# Patient Record
Sex: Male | Born: 1940 | Race: White | Hispanic: No | Marital: Married | Smoking: Former smoker
Health system: Southern US, Community
[De-identification: ages and names within clinical notes are randomized; demographics above are authoritative.]

## PROBLEM LIST (undated history)

## (undated) DIAGNOSIS — I2510 Atherosclerotic heart disease of native coronary artery without angina pectoris: Secondary | ICD-10-CM

## (undated) DIAGNOSIS — I712 Thoracic aortic aneurysm, without rupture: Secondary | ICD-10-CM

## (undated) DIAGNOSIS — G4733 Obstructive sleep apnea (adult) (pediatric): Secondary | ICD-10-CM

## (undated) DIAGNOSIS — E119 Type 2 diabetes mellitus without complications: Secondary | ICD-10-CM

## (undated) DIAGNOSIS — I6529 Occlusion and stenosis of unspecified carotid artery: Secondary | ICD-10-CM

## (undated) DIAGNOSIS — IMO0002 Reserved for concepts with insufficient information to code with codable children: Secondary | ICD-10-CM

## (undated) DIAGNOSIS — I10 Essential (primary) hypertension: Secondary | ICD-10-CM

## (undated) DIAGNOSIS — G2581 Restless legs syndrome: Secondary | ICD-10-CM

## (undated) DIAGNOSIS — K219 Gastro-esophageal reflux disease without esophagitis: Secondary | ICD-10-CM

## (undated) DIAGNOSIS — R51 Headache: Secondary | ICD-10-CM

## (undated) DIAGNOSIS — M1990 Unspecified osteoarthritis, unspecified site: Secondary | ICD-10-CM

## (undated) DIAGNOSIS — K2270 Barrett's esophagus without dysplasia: Secondary | ICD-10-CM

## (undated) DIAGNOSIS — J189 Pneumonia, unspecified organism: Secondary | ICD-10-CM

## (undated) DIAGNOSIS — K5900 Constipation, unspecified: Secondary | ICD-10-CM

## (undated) DIAGNOSIS — Z85850 Personal history of malignant neoplasm of thyroid: Secondary | ICD-10-CM

## (undated) DIAGNOSIS — E079 Disorder of thyroid, unspecified: Secondary | ICD-10-CM

## (undated) DIAGNOSIS — J40 Bronchitis, not specified as acute or chronic: Secondary | ICD-10-CM

## (undated) DIAGNOSIS — Z9889 Other specified postprocedural states: Secondary | ICD-10-CM

## (undated) DIAGNOSIS — R519 Headache, unspecified: Secondary | ICD-10-CM

## (undated) DIAGNOSIS — R0600 Dyspnea, unspecified: Secondary | ICD-10-CM

## (undated) DIAGNOSIS — C801 Malignant (primary) neoplasm, unspecified: Secondary | ICD-10-CM

## (undated) DIAGNOSIS — G4730 Sleep apnea, unspecified: Secondary | ICD-10-CM

## (undated) DIAGNOSIS — J449 Chronic obstructive pulmonary disease, unspecified: Secondary | ICD-10-CM

## (undated) DIAGNOSIS — I7121 Aneurysm of the ascending aorta, without rupture: Secondary | ICD-10-CM

## (undated) HISTORY — PX: ROTATOR CUFF REPAIR: SHX139

## (undated) HISTORY — DX: Type 2 diabetes mellitus without complications: E11.9

## (undated) HISTORY — PX: JOINT REPLACEMENT: SHX530

## (undated) HISTORY — DX: Restless legs syndrome: G25.81

## (undated) HISTORY — DX: Essential (primary) hypertension: I10

## (undated) HISTORY — DX: Gastro-esophageal reflux disease without esophagitis: K21.9

## (undated) HISTORY — DX: Barrett's esophagus without dysplasia: K22.70

## (undated) HISTORY — PX: COLONOSCOPY: SHX174

## (undated) HISTORY — DX: Personal history of malignant neoplasm of thyroid: Z85.850

## (undated) HISTORY — DX: Occlusion and stenosis of unspecified carotid artery: I65.29

## (undated) HISTORY — DX: Disorder of thyroid, unspecified: E07.9

## (undated) HISTORY — PX: EYE SURGERY: SHX253

## (undated) HISTORY — DX: Malignant (primary) neoplasm, unspecified: C80.1

## (undated) HISTORY — PX: OTHER SURGICAL HISTORY: SHX169

## (undated) HISTORY — DX: Atherosclerotic heart disease of native coronary artery without angina pectoris: I25.10

## (undated) HISTORY — DX: Sleep apnea, unspecified: G47.30

## (undated) HISTORY — DX: Obstructive sleep apnea (adult) (pediatric): G47.33

## (undated) HISTORY — DX: Chronic obstructive pulmonary disease, unspecified: J44.9

## (undated) HISTORY — DX: Reserved for concepts with insufficient information to code with codable children: IMO0002

---

## 1997-11-20 ENCOUNTER — Ambulatory Visit (HOSPITAL_COMMUNITY): Admission: RE | Admit: 1997-11-20 | Discharge: 1997-11-20 | Payer: Self-pay | Admitting: Gastroenterology

## 1999-08-31 ENCOUNTER — Encounter: Admission: RE | Admit: 1999-08-31 | Discharge: 1999-08-31 | Payer: Self-pay | Admitting: *Deleted

## 1999-08-31 ENCOUNTER — Encounter: Payer: Self-pay | Admitting: *Deleted

## 2002-01-06 ENCOUNTER — Ambulatory Visit (HOSPITAL_COMMUNITY): Admission: RE | Admit: 2002-01-06 | Discharge: 2002-01-06 | Payer: Self-pay | Admitting: Internal Medicine

## 2003-04-07 ENCOUNTER — Ambulatory Visit (HOSPITAL_BASED_OUTPATIENT_CLINIC_OR_DEPARTMENT_OTHER): Admission: RE | Admit: 2003-04-07 | Discharge: 2003-04-07 | Payer: Self-pay | Admitting: Internal Medicine

## 2003-11-19 ENCOUNTER — Ambulatory Visit (HOSPITAL_COMMUNITY): Admission: RE | Admit: 2003-11-19 | Discharge: 2003-11-19 | Payer: Self-pay | Admitting: Internal Medicine

## 2005-10-16 ENCOUNTER — Encounter: Admission: RE | Admit: 2005-10-16 | Discharge: 2005-10-16 | Payer: Self-pay | Admitting: Internal Medicine

## 2005-10-16 IMAGING — US US CAROTID DUPLEX BILAT
1 series · 13 of 24 positions shown · non-contrast
Comparison: None.

CLINICAL DATA: 64 year old male with hypertension, tobacco use, and a left carotid bruit.  
DUPLEX CAROTID ULTRASOUND:

[Series 1: unknown · 0.09mm/px · 13 of 71 slices shown]
[im 1/71]
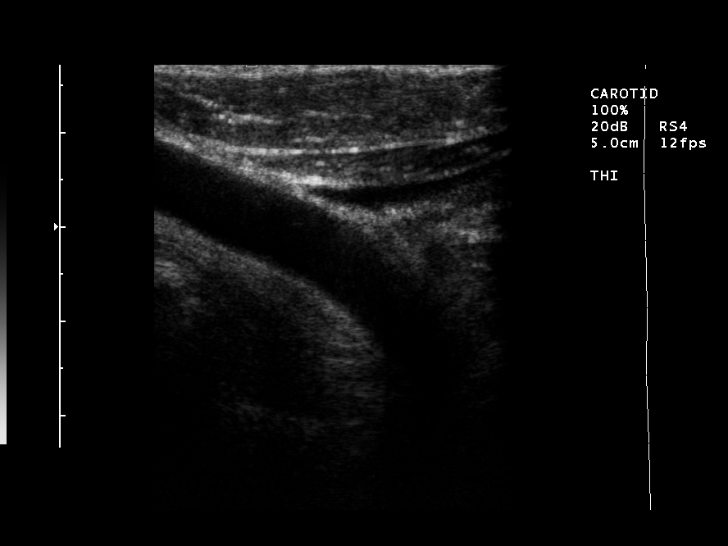
[im 7/71]
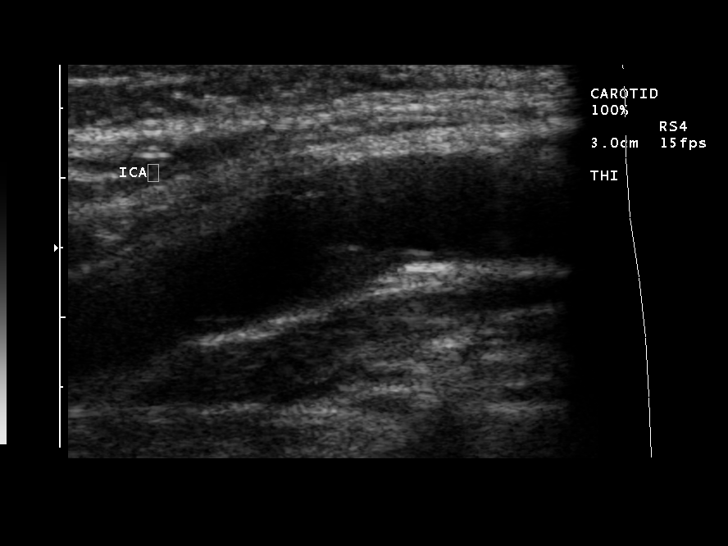
[im 13/71]
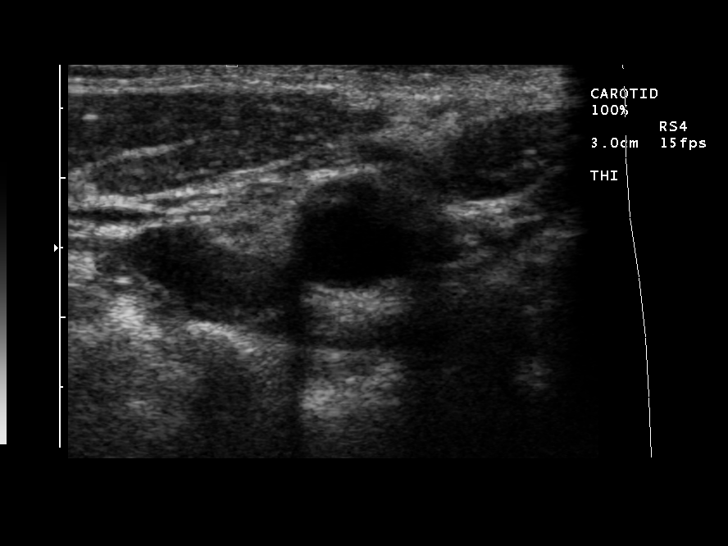
[im 19/71]
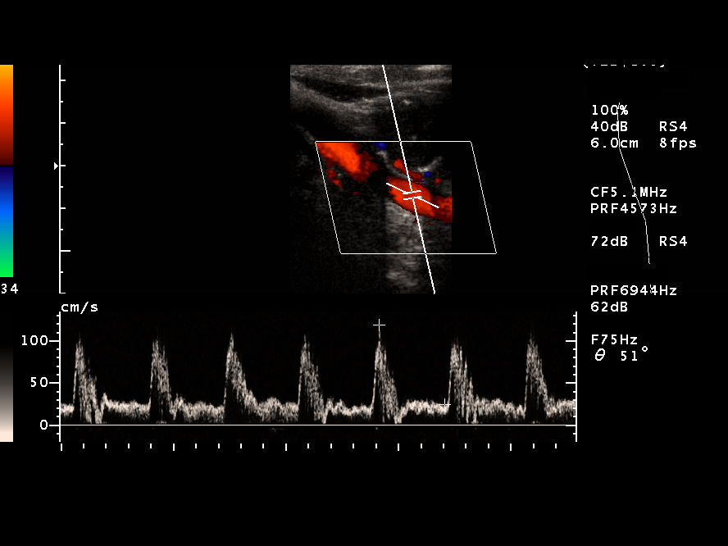
[im 25/71]
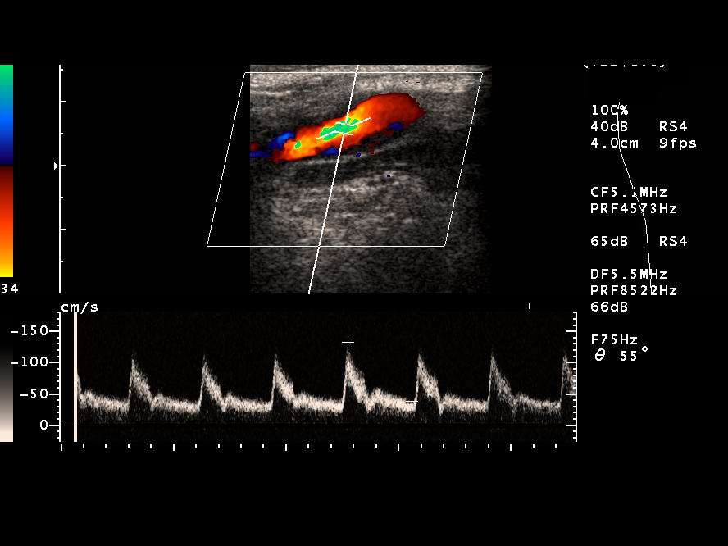
[im 31/71]
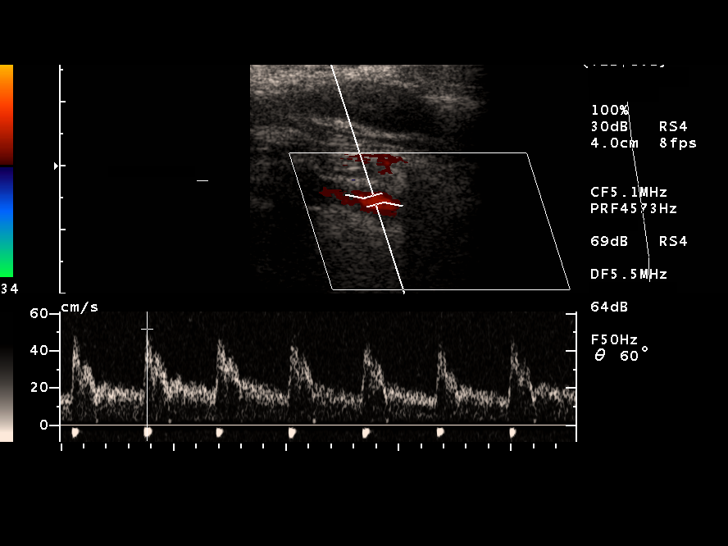
[im 37/71]
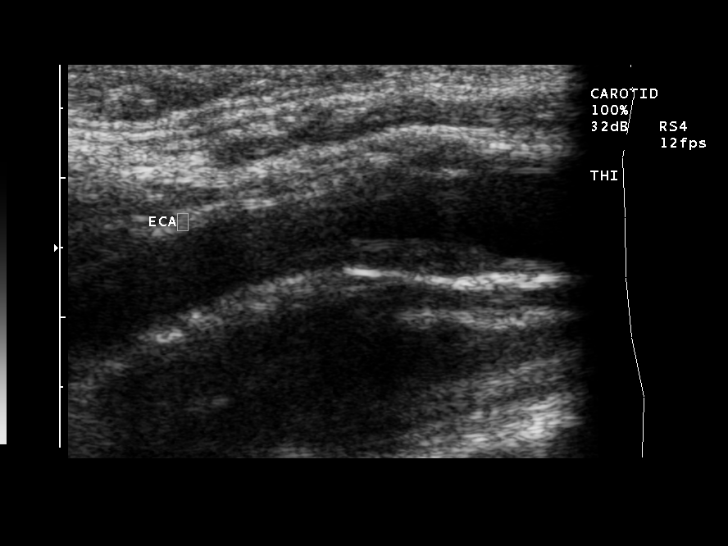
[im 40/71]
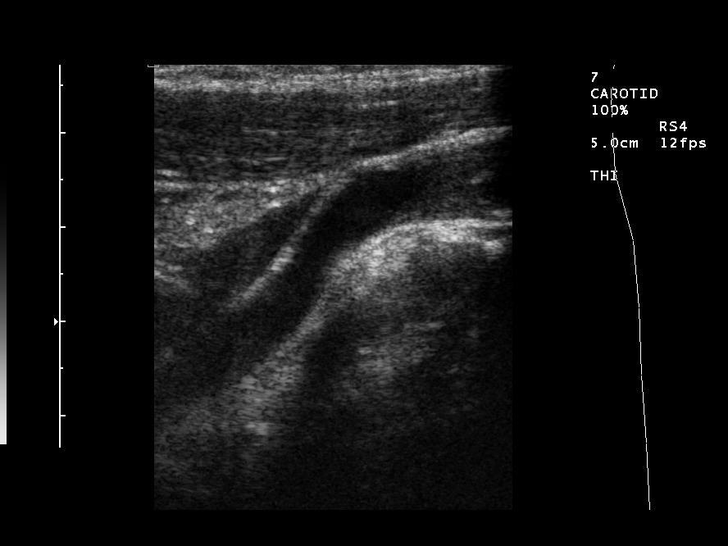
[im 46/71]
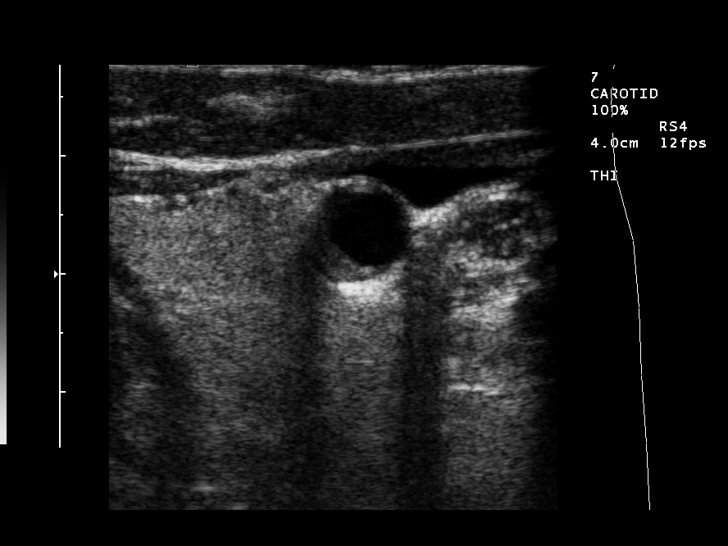
[im 52/71]
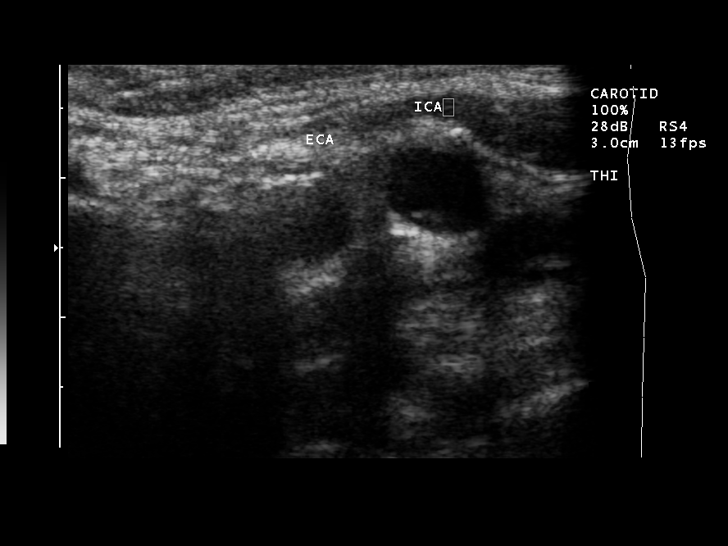
[im 58/71]
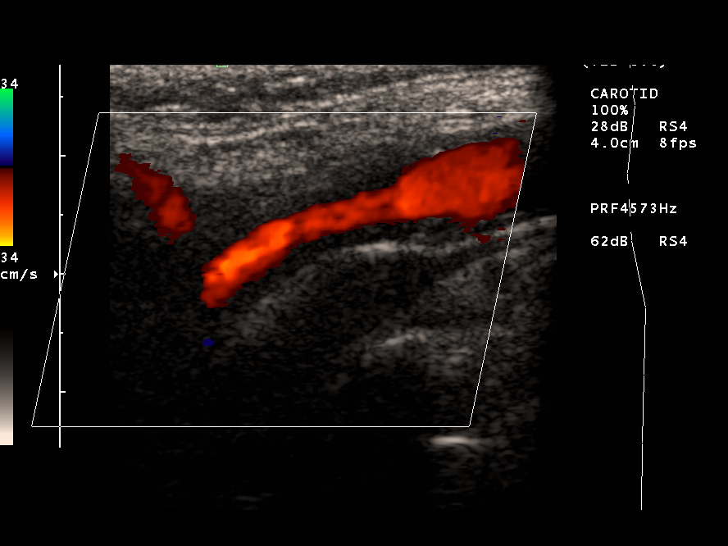
[im 64/71]
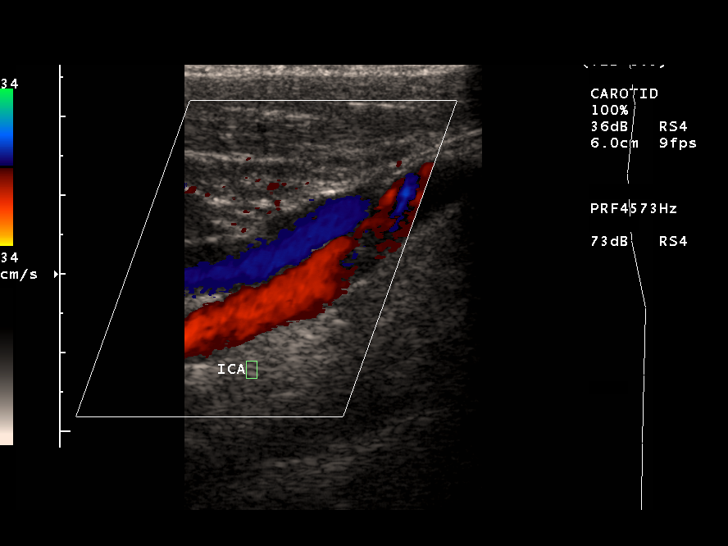
[im 71/71]
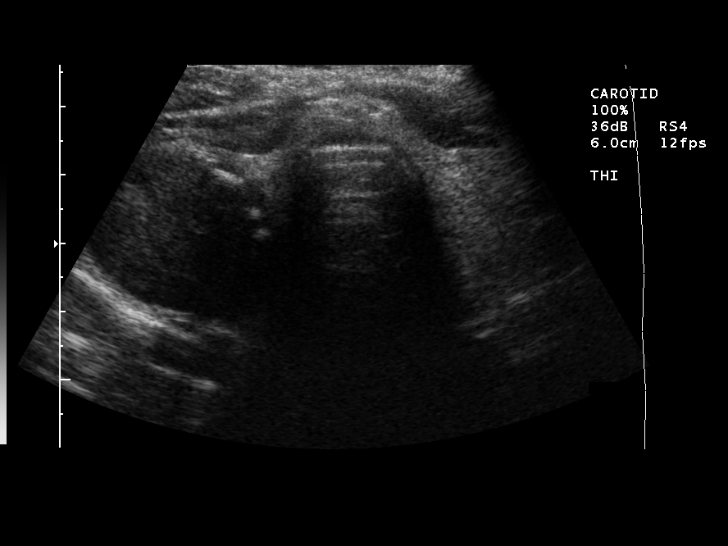

[13 of 24 positions shown; findings below may reference images not displayed]

Velocities are as follows (cm per second):
SITE  PEAK SYSTOLIC  END-DIASTOLIC
RIGHT ICA  155  52
RIGHT CCA    118  24
RIGHT ICA/CCA RATIO
RIGHT ECA  170
LEFT ICA  390  161
LEFT CCA  96  22
LEFT ICA/CCA RATIO
LEFT ECA  170
Right Carotid System:  There is plaque formation and intimal thickening of the carotid bifurcation.  The right ICA velocity is elevated at 155cm per second.  This is compared to a right CCA velocity of 118cm per second.  The right ICA/CCA ratio is 1.31.  The end diastolic velocity is elevated at 52cm per second.  This correlates with a 50 to 69% right ICA stenosis by velocity and ratio criteria.  The right vertebral artery is antegrade in flow.  
Left Carotid System:  There is more extensive atherosclerotic plaque formation of the left carotid bifurcation.  There is marked elevation of a left ICA peak systolic velocity at 390cm per second compared to a CCA velocity of 96cm per second.  There is spectral broadening diffusely of the ICA wave form .  The left ICA/CCA ratio is abnormal at 4.09.  End diastolic velocity is also elevated at 161cm per second.  This correlates with a significant greater than 70% left ICA stenosis.  The left vertebral artery is antegrade in flow.
In the right thyroid gland there is a solid round thyroid mass measuring 2.5cm.  Dedicated thyroid ultrasound could be performed.
IMPRESSION: 1.  Hemodynamically significant greater than 70% left ICA stenosis. 
2.  Right ICA stenosis estimated at 50 to 69%.  
3.  Both vertebral arteries are antegrade in flow and patent. 
4.  2.5cm solid right thyroid mass.

## 2005-10-23 ENCOUNTER — Encounter: Admission: RE | Admit: 2005-10-23 | Discharge: 2005-10-23 | Payer: Self-pay | Admitting: Internal Medicine

## 2005-10-26 ENCOUNTER — Other Ambulatory Visit: Admission: RE | Admit: 2005-10-26 | Discharge: 2005-10-26 | Payer: Self-pay | Admitting: Interventional Radiology

## 2005-10-26 ENCOUNTER — Encounter: Admission: RE | Admit: 2005-10-26 | Discharge: 2005-10-26 | Payer: Self-pay | Admitting: Internal Medicine

## 2005-10-26 ENCOUNTER — Encounter (INDEPENDENT_AMBULATORY_CARE_PROVIDER_SITE_OTHER): Payer: Self-pay | Admitting: *Deleted

## 2005-10-26 IMAGING — US US BIOPSY
1 series · 6 of 6 positions shown · non-contrast
Comparison: none

CLINICAL DATA: Dominant cold right thyroid nodule

ULTRASOUND GUIDED ASPIRATION BIOPSY THYROID:
TECHNIQUE: Overlying skin prepped with Betadine, draped in usual sterile
fashion, and infiltrated locally with 1% lidocaine. Under real-time ultrasound
guidance, 4 passes were made into the dominant solid right mid pole nodule using
25 gauge needles. Samples were given to cytopathology. No immediate
complication.

[Series 1: unknown · 0.09mm/px · 6 of 6 slices shown]
[im 1/6]
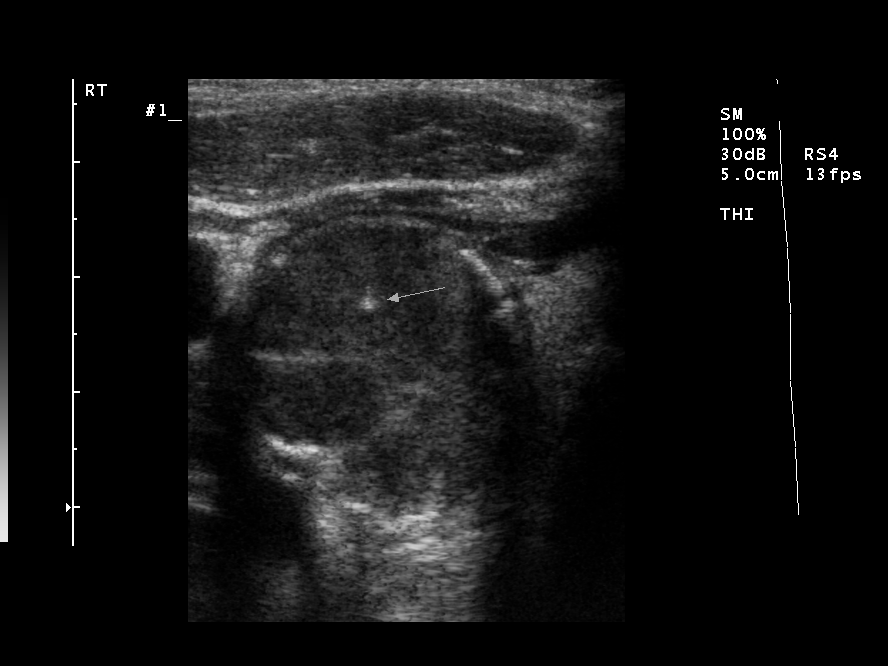
[im 2/6]
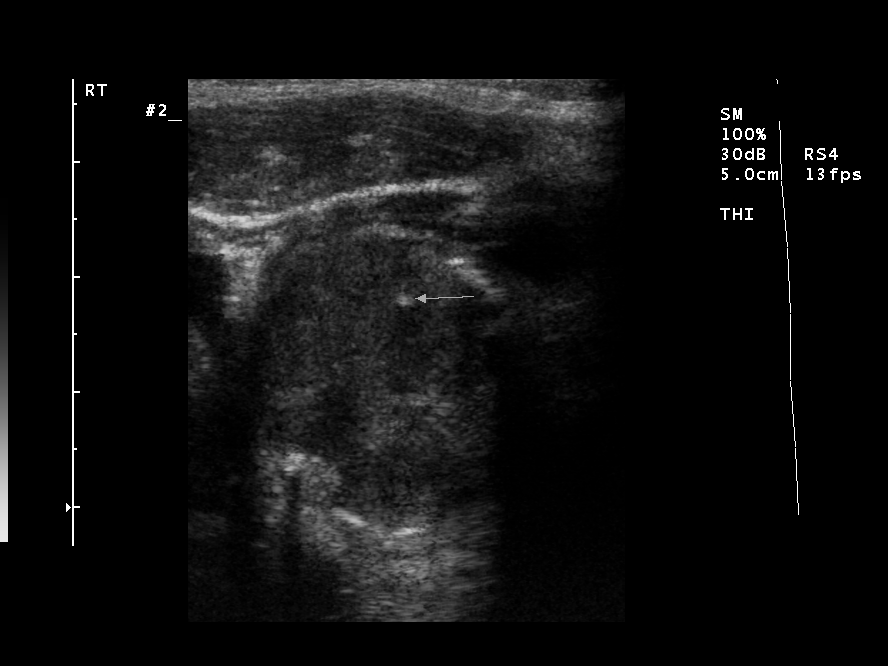
[im 3/6]
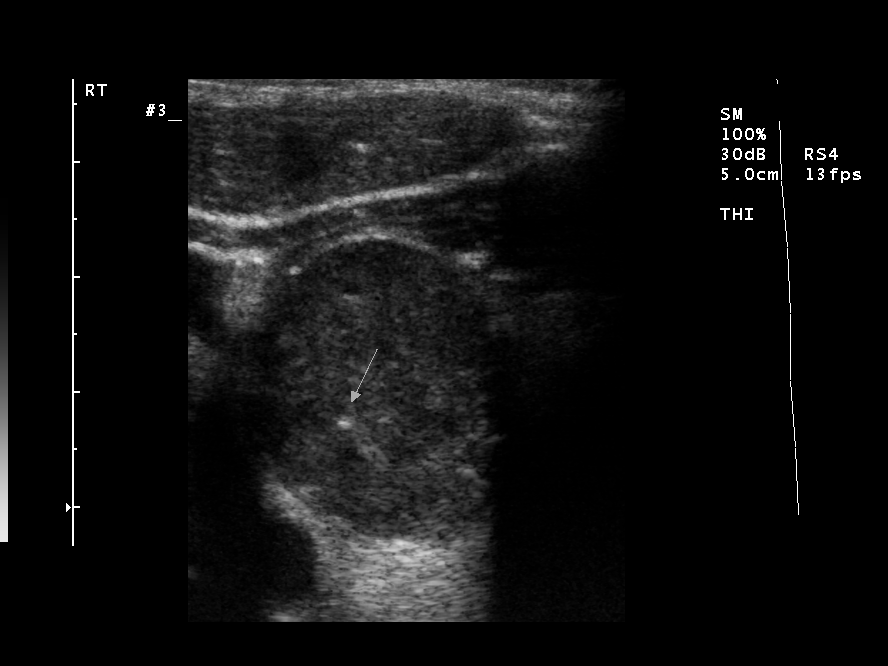
[im 4/6]
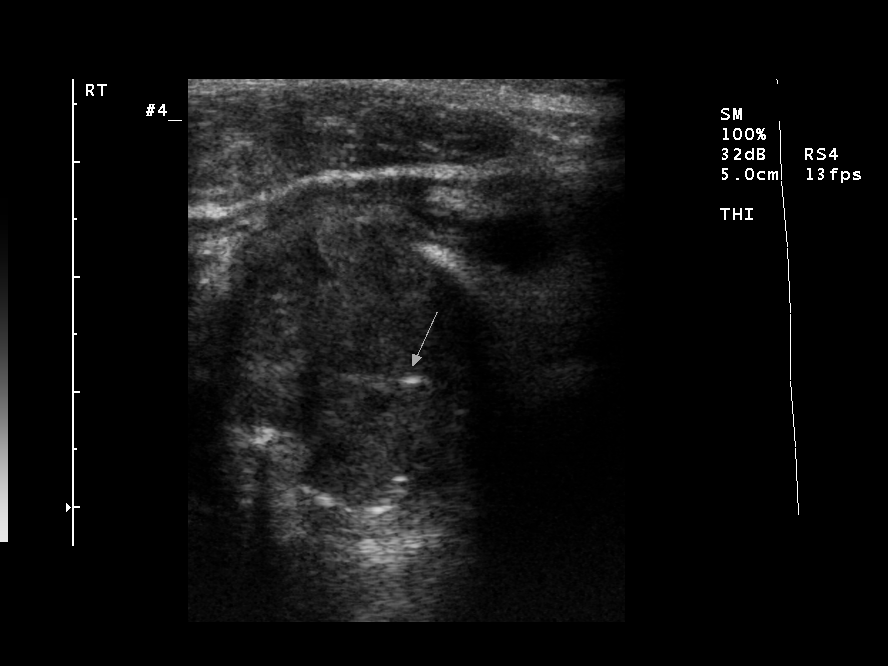
[im 5/6]
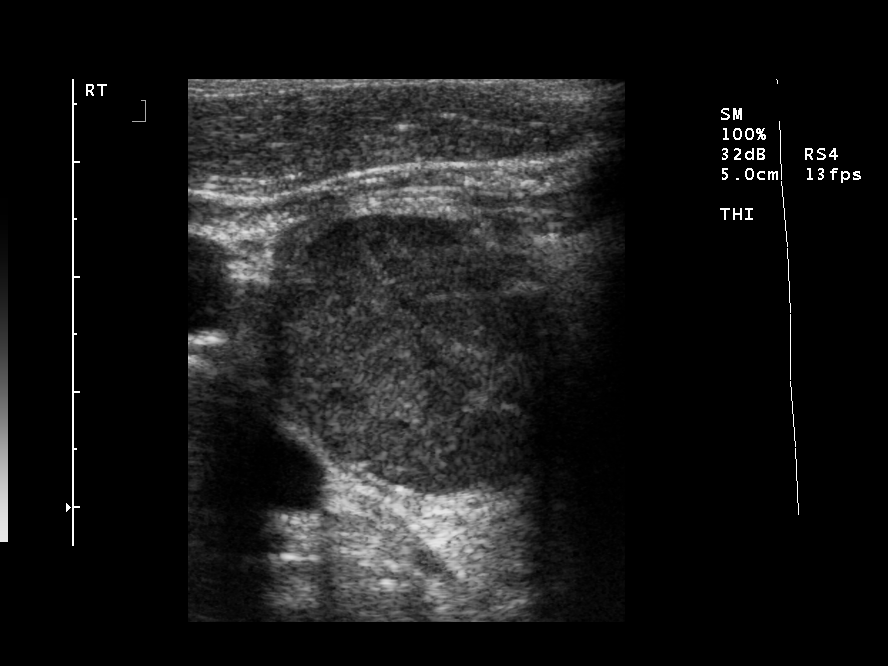
[im 6/6]
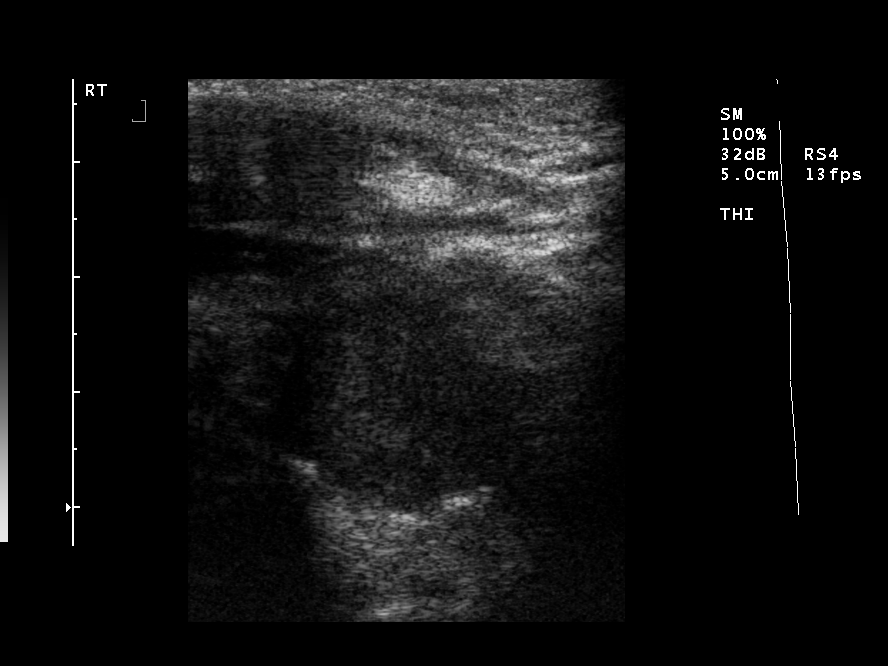

[6 of 6 positions shown; findings below may reference images not displayed]

IMPRESSION: Technically successful ultrasound guided FNA thyroid biopsy.

## 2005-10-26 IMAGING — US US BIOPSY
1 series · 14 of 25 positions shown · non-contrast
Comparison: none

CLINICAL DATA: Dominant cold right thyroid nodule

ULTRASOUND GUIDED ASPIRATION BIOPSY THYROID:
TECHNIQUE: Overlying skin prepped with Betadine, draped in usual sterile
fashion, and infiltrated locally with 1% lidocaine. Under real-time ultrasound
guidance, 4 passes were made into the dominant solid right mid pole nodule using
25 gauge needles. Samples were given to cytopathology. No immediate
complication.

[Series 1: unknown · 0.07mm/px · 14 of 43 slices shown]
[im 1/43]
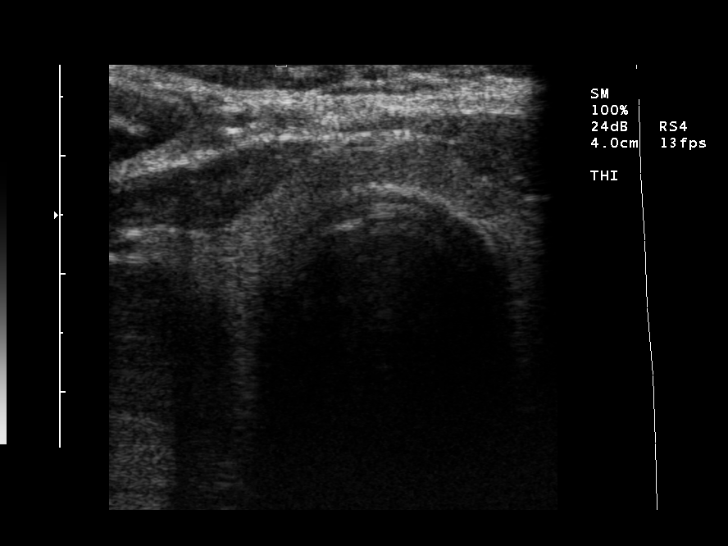
[im 4/43]
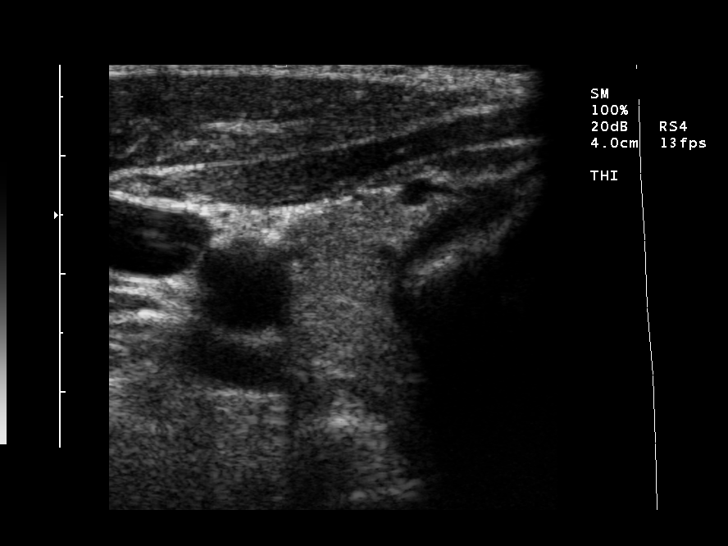
[im 8/43]
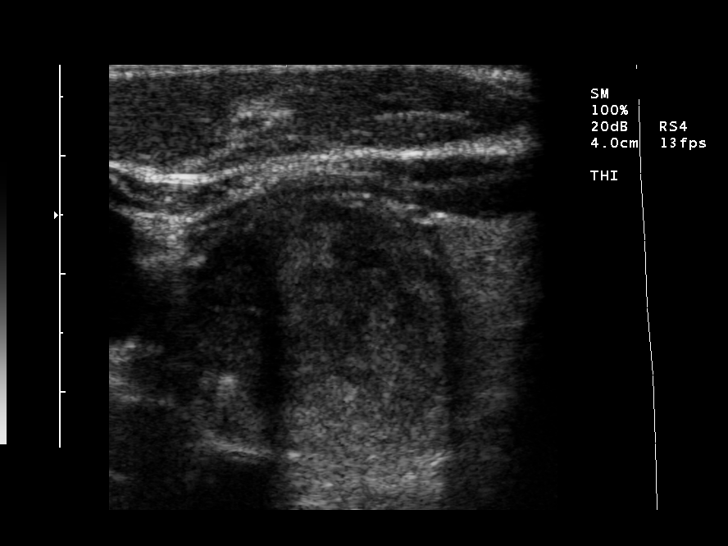
[im 11/43]
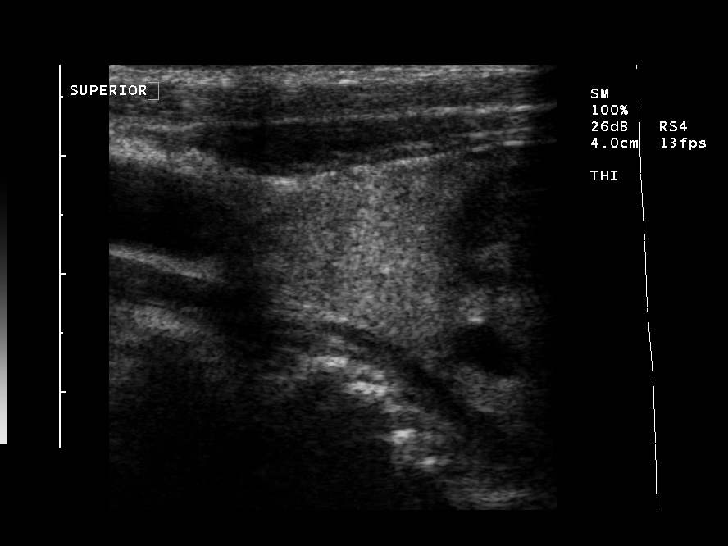
[im 15/43]
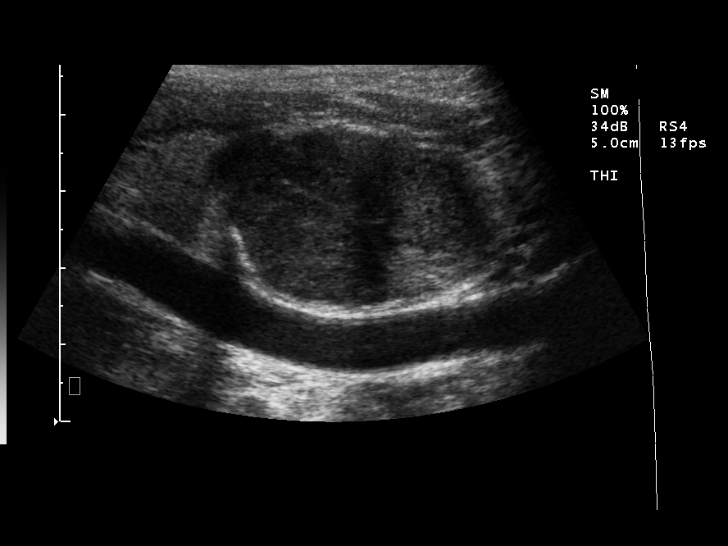
[im 16/43]
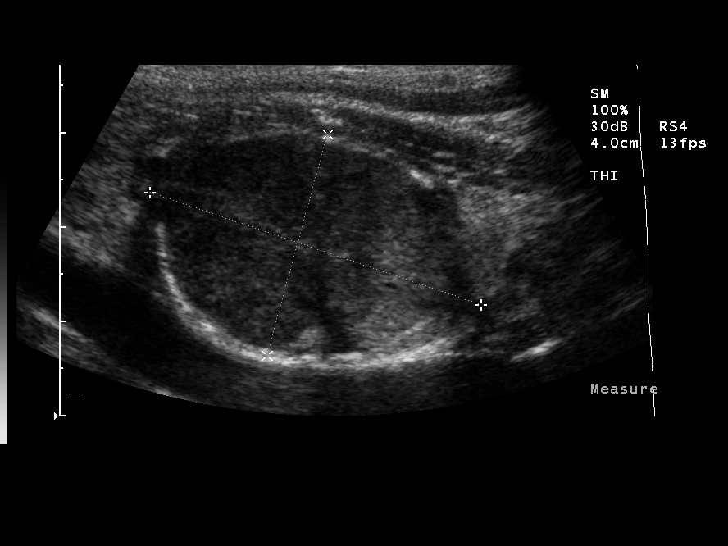
[im 20/43]
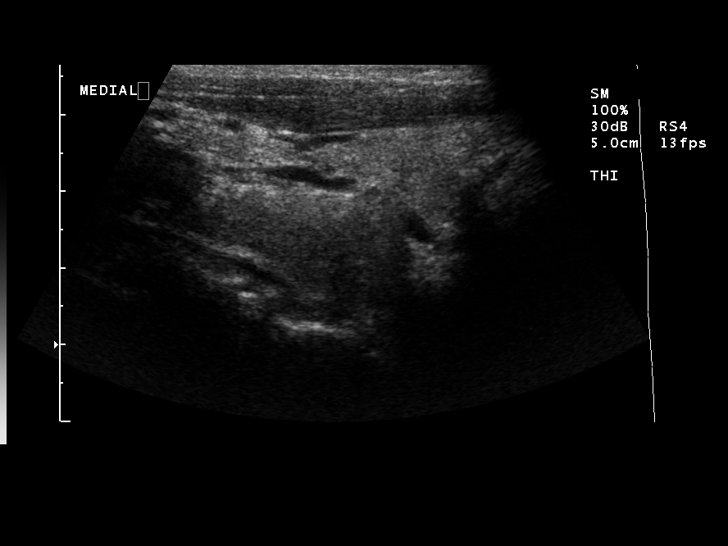
[im 23/43]
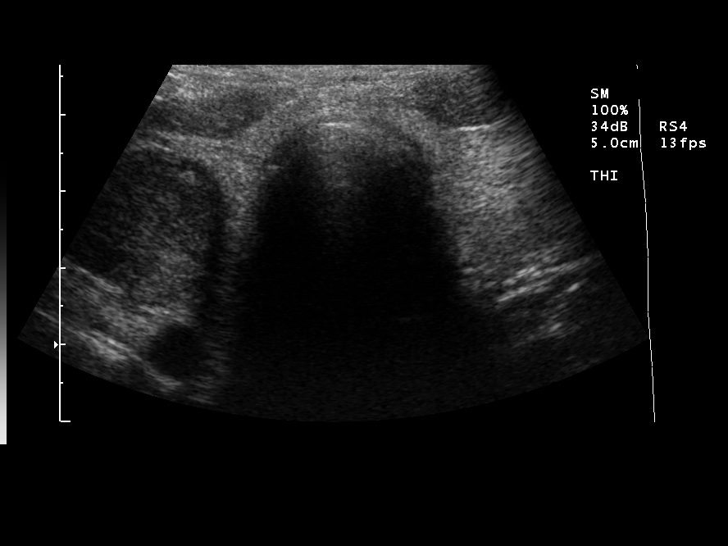
[im 27/43]
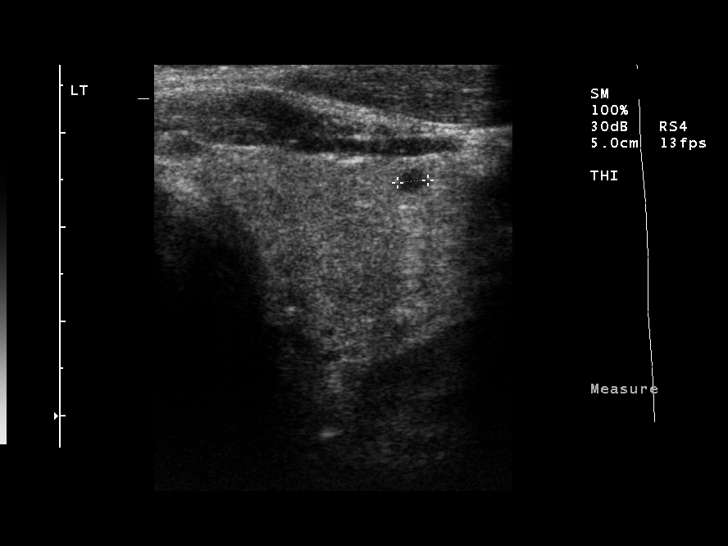
[im 29/43]
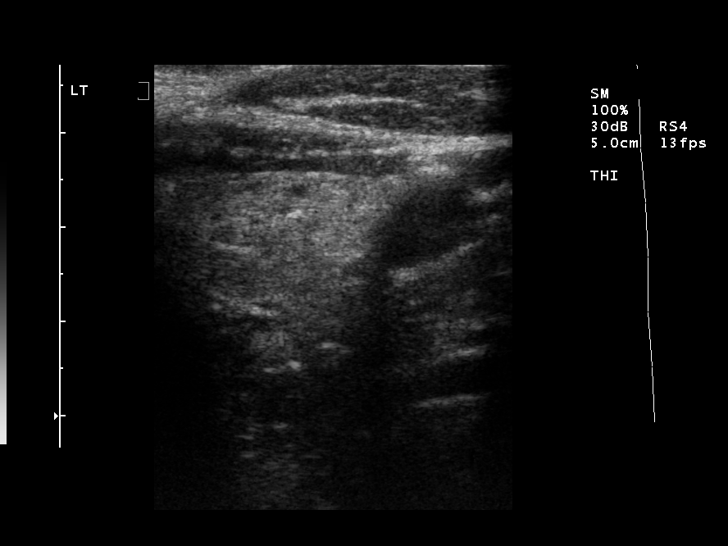
[im 32/43]
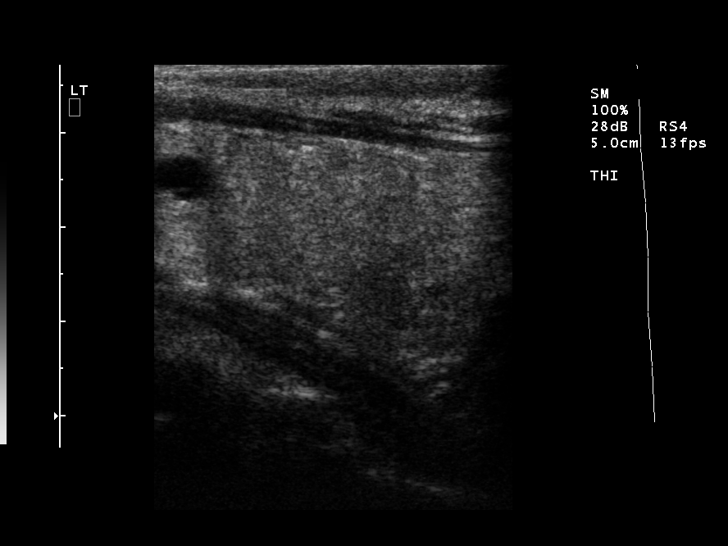
[im 36/43]
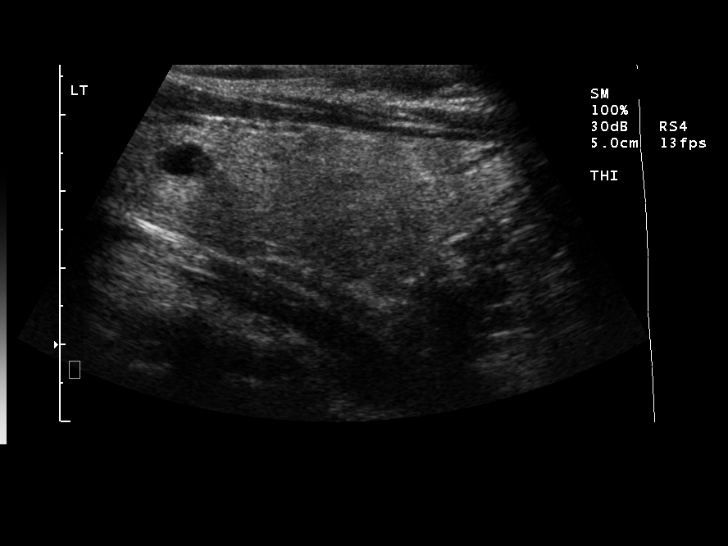
[im 39/43]
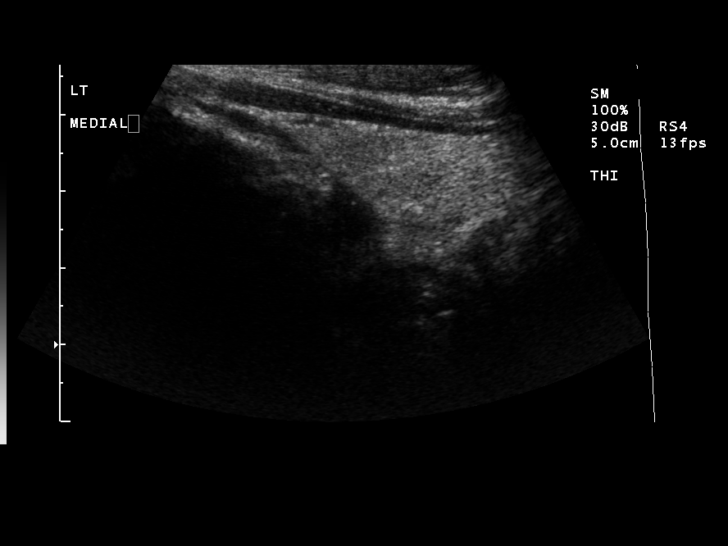
[im 43/43]
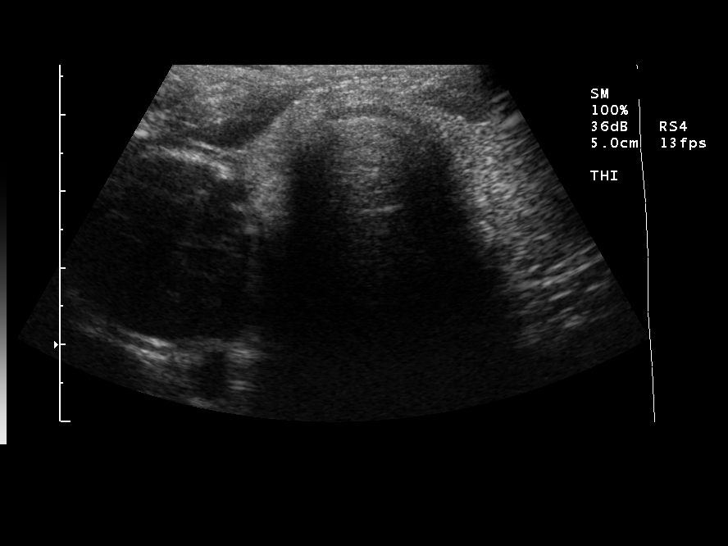

[14 of 25 positions shown; findings below may reference images not displayed]

IMPRESSION: Technically successful ultrasound guided FNA thyroid biopsy.

## 2005-12-18 ENCOUNTER — Encounter: Admission: RE | Admit: 2005-12-18 | Discharge: 2005-12-18 | Payer: Self-pay | Admitting: *Deleted

## 2005-12-18 IMAGING — CT CT ANGIO NECK
2 of 9 series · 7 of 33 positions shown · IV contrast ([ID] OMNI 350)
Comparison: Ultrasound, [DATE] and [DATE].   Nuclear medicine study of the thyroid gland, [DATE].

CLINICAL DATA: Left ICA stenosis.  Thyroid mass seen on ultrasound. 
CT ANGIOGRAPHY OF THE NECK:
TECHNIQUE: Multidetector CT imaging of the neck was performed during bolus injection of intravenous contrast.  Multiplanar CT angiographic image reconstructions were generated to evaluate the extracranial carotid arteries.
Contrast:  125 cc Omnipaque 350

[Series 5: recon 2: cta neck · axial · 0.29mm/px · z∈[+65,+246]mm · 6 of 405 slices shown]
[im 58/405  soft-tissue]
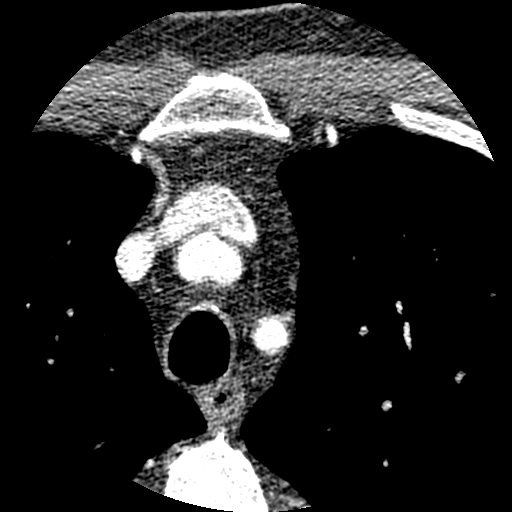
[im 116/405  bone]
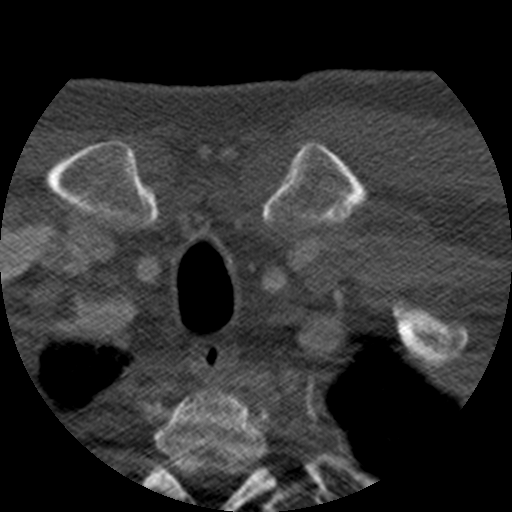
[im 174/405  soft-tissue]
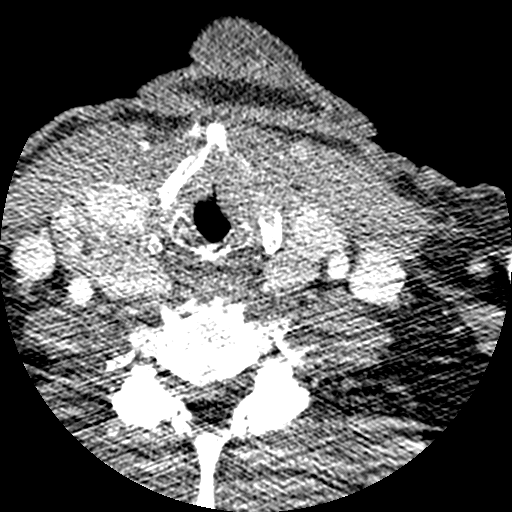
[im 231/405  bone]
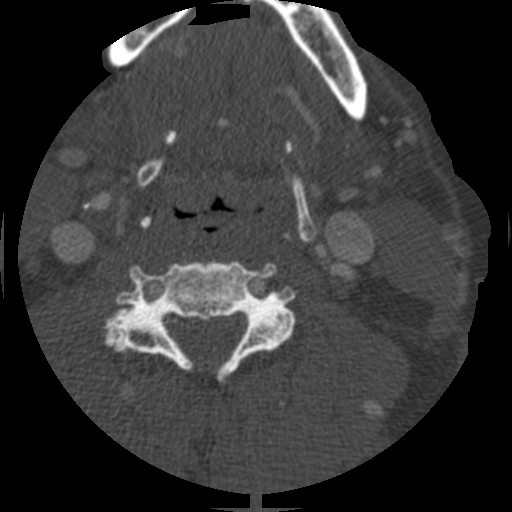
[im 289/405  soft-tissue]
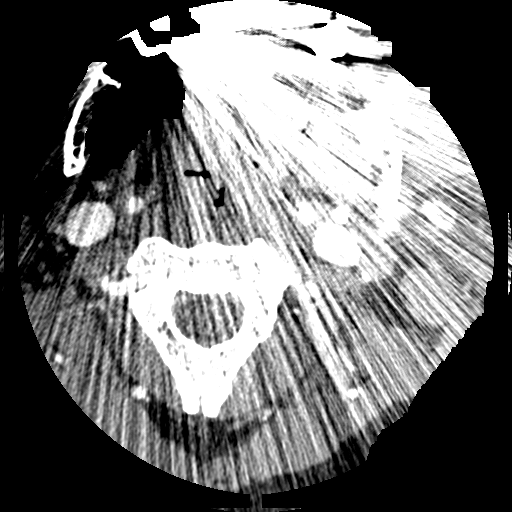
[im 347/405  bone]
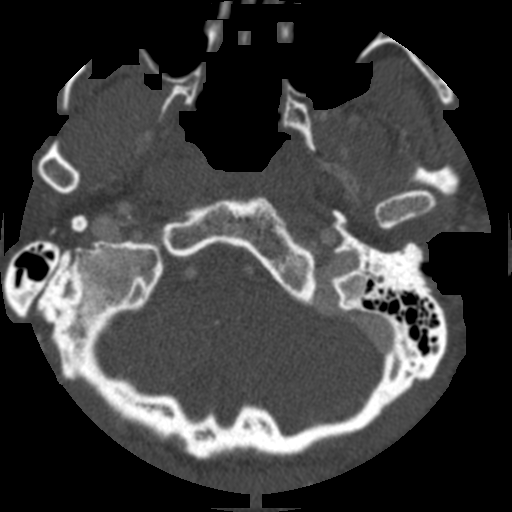

[Series 502: reformatted · sagittal · 0.49mm/px · 1 of 149 slices shown]
[im 75/149  soft-tissue]
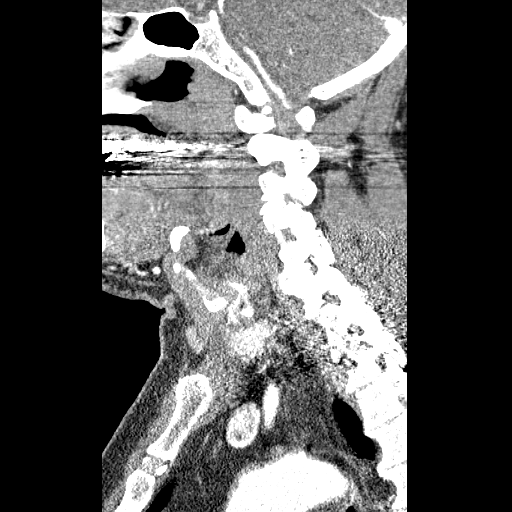

[7 of 33 positions shown; findings below may reference images not displayed]

FINDINGS: The branching pattern of the brachiocephalic vessels from the arch is normal.   No origin stenoses.  
The right common carotid artery is widely patent to the bifurcation.  There is atherosclerotic plaque at the bifurcation.  The narrowest diameter of the proximal ICA is 3.5 mm.   The normal distal ICA has a diameter of 5.1 mm.  The stenosis would be graded at 30% using NASCET criteria.  There is very mild narrowing of the proximal ECA.  
On the left, the common carotid artery is widely patent to the bifurcation.  There is atherosclerotic plaque affecting the carotid bifurcation region.  This has irregular plaque that includes a proximal posterior wall ulceration.  The narrowest diameter of the proximal ICA is 2 mm.  There is some post stenotic dilatation.  Distally, the normal ICA measures 5 mm.  The stenosis is 60% by NASCET criteria.  There is mild narrowing of the proximal ECA origin, not flow limiting.  
Both vertebral arteries are patent and are approximately equal in size.  I do not see any vertebral artery origin stenosis.  
Ancillary Findings:    There is a well-circumscribed solid mass in the right lobe of the thyroid measuring 3.3 cm cephalocaudal with a transverse diameter of 2.7 x 2.4 cm.  There is some peripheral calcification.  The lesion clearly enhances.  This could be a benign or malignant nodule.  There are a few other subcentimeter areas of low density in the thyroid gland which did not enhance or show associated calcification.  
The patient has degenerative spondylosis and degenerative facet arthropathy throughout the cervical region.  
A complete intracranial study was not ordered or performed, but I do not see any stenotic or occlusive disease of the proximal vessels that we do visualize.
IMPRESSION: 1.  30% diameter stenosis of the proximal right ICA.
2.  60% diameter stenosis of the proximal left ICA.  Note that there is an associated ulceration on this side. 
3.  Well-defined apparently encapsulated solid lesion of the right lobe of the thyroid which could be benign or malignant.

## 2006-01-01 IMAGING — CR DG CHEST 2V
2 series · 2 of 2 positions shown · non-contrast
Comparison: None available.

CLINICAL DATA: Preoperative for left internal carotid artery stenosis.
 CHEST ? 2 VIEW:

[view not recorded (1 of 2)]
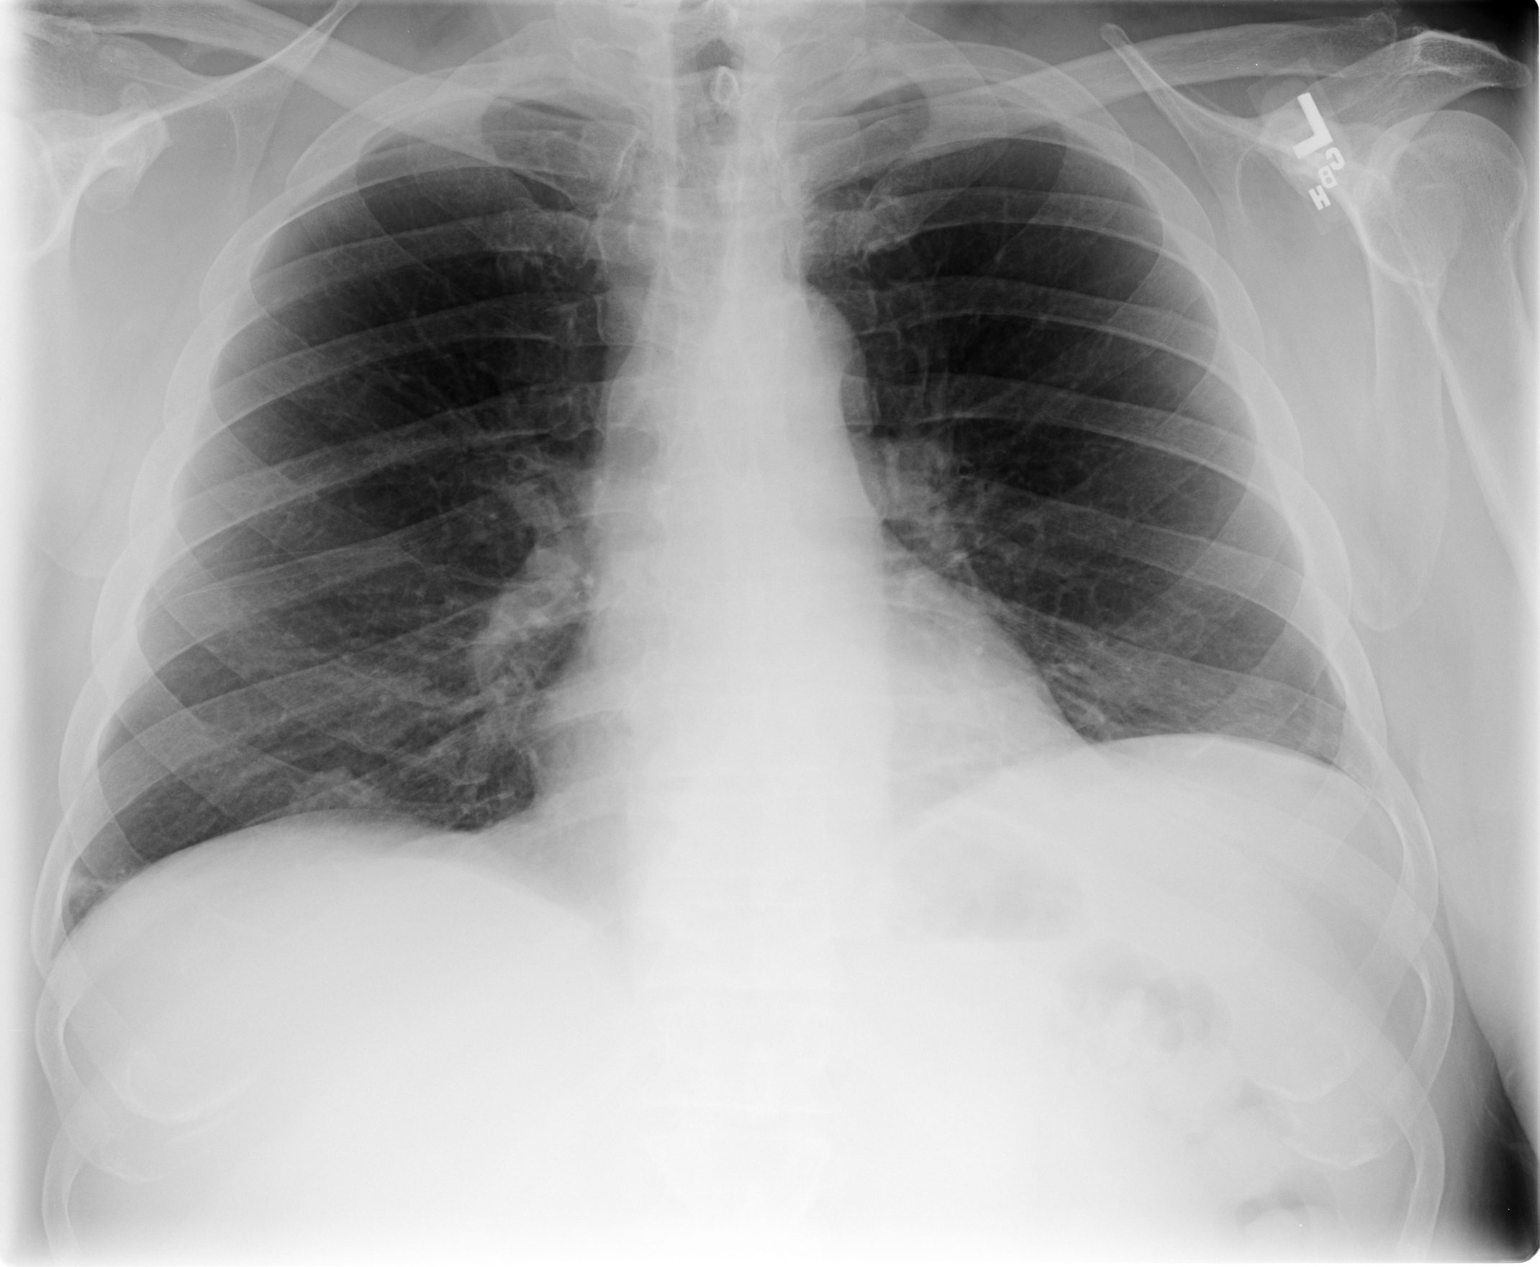

[view not recorded (2 of 2)]
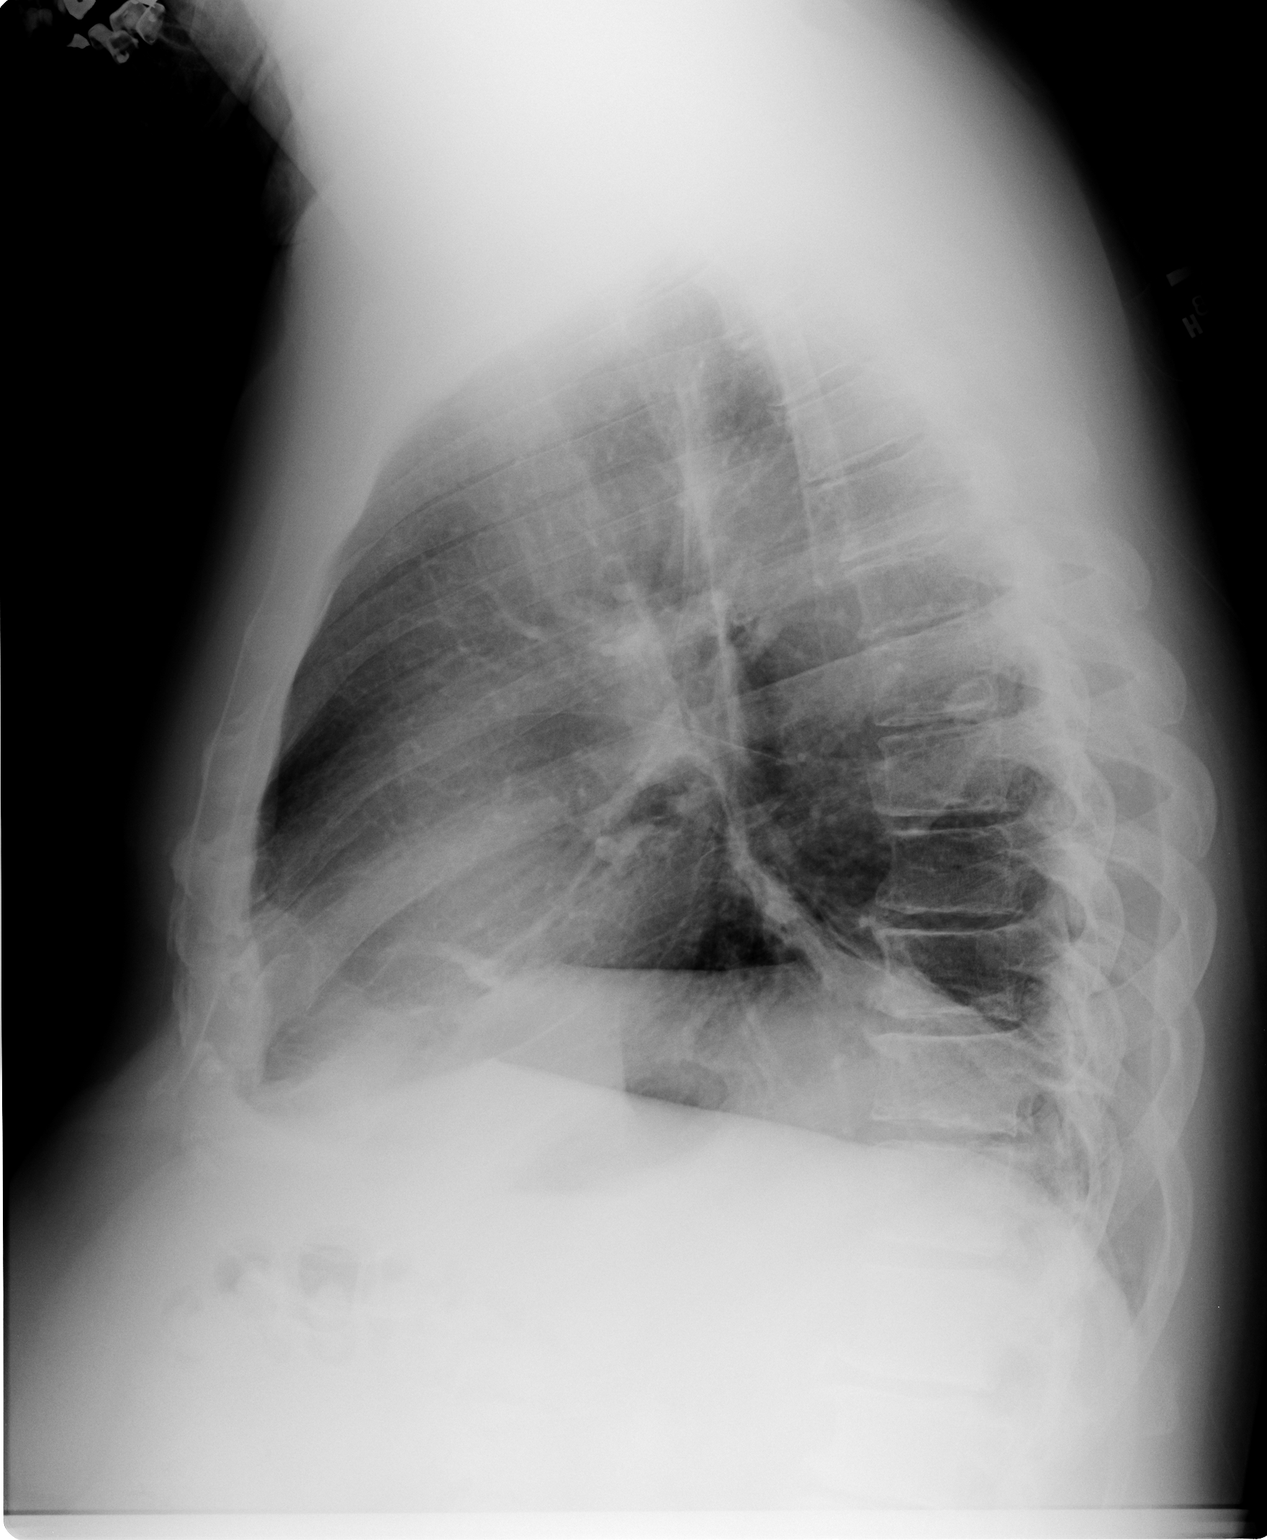

[2 of 2 positions shown; findings below may reference images not displayed]

FINDINGS: Trachea is midline.  Heart size is normal.  Right base subsegmental atelectasis and/or scar.  Lungs are otherwise clear.  No pleural fluid.
IMPRESSION: No acute findings.

## 2006-01-03 ENCOUNTER — Encounter (INDEPENDENT_AMBULATORY_CARE_PROVIDER_SITE_OTHER): Payer: Self-pay | Admitting: *Deleted

## 2006-01-03 ENCOUNTER — Inpatient Hospital Stay (HOSPITAL_COMMUNITY): Admission: RE | Admit: 2006-01-03 | Discharge: 2006-01-04 | Payer: Self-pay | Admitting: *Deleted

## 2006-01-03 HISTORY — PX: CAROTID ENDARTERECTOMY: SUR193

## 2006-03-14 ENCOUNTER — Encounter (INDEPENDENT_AMBULATORY_CARE_PROVIDER_SITE_OTHER): Payer: Self-pay | Admitting: Specialist

## 2006-03-14 ENCOUNTER — Ambulatory Visit (HOSPITAL_COMMUNITY): Admission: RE | Admit: 2006-03-14 | Discharge: 2006-03-15 | Payer: Self-pay | Admitting: General Surgery

## 2006-05-08 ENCOUNTER — Encounter: Admission: RE | Admit: 2006-05-08 | Discharge: 2006-05-08 | Payer: Self-pay | Admitting: Endocrinology

## 2006-05-17 ENCOUNTER — Encounter: Admission: RE | Admit: 2006-05-17 | Discharge: 2006-05-17 | Payer: Self-pay | Admitting: Endocrinology

## 2006-05-17 IMAGING — NM NM RAI THERAPY CANCER THYROID
1 series · 1 of 1 positions shown · non-contrast
Comparison: none

CLINICAL DATA: Postop thyroidectomy for thyroid cancer with residual thyroid tissue on recent thyroid imaging study. Thyroid ablation.
NUCLEAR MEDICINE RADIOACTIVE IODINE THERAPY FOR THYROID CANCER:
Dr. MOATSHE reviewed the patient?s diagnostic study of [DATE] and prescribed radioactive iodine.  Today, I met with the patient and discussed the therapy and reviewed the safety precautions of radioactive iodine.  All questions were answered.  The patient signed an informed written consent for the procedure.  He will follow-up with Dr. MOATSHE.
Radiopharmaceutical:  103 mCi [0V] orally.

[st statics, dual detec · 2.33mm/px · 1 of 1 slices shown]
[im 1/1]
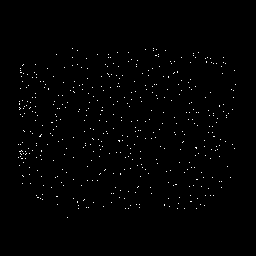

[1 of 1 positions shown; findings below may reference images not displayed]

IMPRESSION: Radioactive iodine therapy for thyroid cancer, as described.

## 2006-05-24 ENCOUNTER — Encounter: Admission: RE | Admit: 2006-05-24 | Discharge: 2006-05-24 | Payer: Self-pay | Admitting: Endocrinology

## 2006-06-19 DIAGNOSIS — Z85850 Personal history of malignant neoplasm of thyroid: Secondary | ICD-10-CM

## 2006-06-19 HISTORY — PX: THYROIDECTOMY: SHX17

## 2006-06-19 HISTORY — DX: Personal history of malignant neoplasm of thyroid: Z85.850

## 2006-08-03 ENCOUNTER — Encounter (INDEPENDENT_AMBULATORY_CARE_PROVIDER_SITE_OTHER): Payer: Self-pay | Admitting: *Deleted

## 2006-08-03 ENCOUNTER — Ambulatory Visit (HOSPITAL_COMMUNITY): Admission: RE | Admit: 2006-08-03 | Discharge: 2006-08-03 | Payer: Self-pay | Admitting: Gastroenterology

## 2006-08-16 ENCOUNTER — Ambulatory Visit: Payer: Self-pay | Admitting: *Deleted

## 2007-07-03 ENCOUNTER — Encounter: Admission: RE | Admit: 2007-07-03 | Discharge: 2007-07-03 | Payer: Self-pay | Admitting: Endocrinology

## 2007-08-01 ENCOUNTER — Ambulatory Visit: Payer: Self-pay | Admitting: *Deleted

## 2008-02-04 ENCOUNTER — Encounter: Admission: RE | Admit: 2008-02-04 | Discharge: 2008-02-04 | Payer: Self-pay | Admitting: Orthopedic Surgery

## 2008-02-04 IMAGING — CT CT CERVICAL SPINE W/O CM
3 of 4 series · 16 of 28 positions shown, 18 images · non-contrast
Comparison: none

CLINICAL DATA: Right hand numbness.

CT CERVICAL SPINE WITHOUT CONTRAST
TECHNIQUE: Multidetector CT imaging of the cervical spine was
performed. Multiplanar CT image reconstructions were also generated

[Series 2: cervical spine · axial · 0.23mm/px · z∈[+50,+165]mm · 5 of 138 slices shown, 7 images]
[im 23/138  soft-tissue]
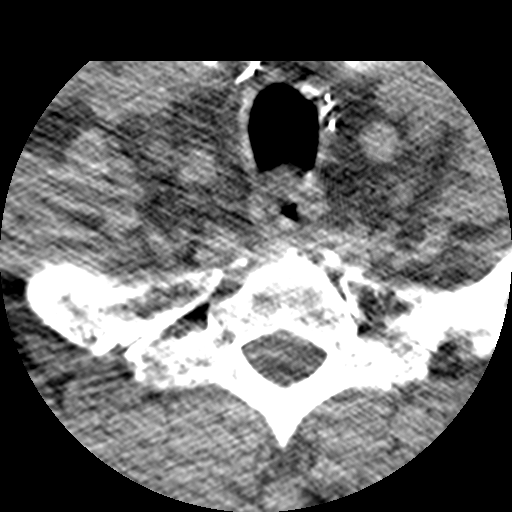
[im 23/138  bone]
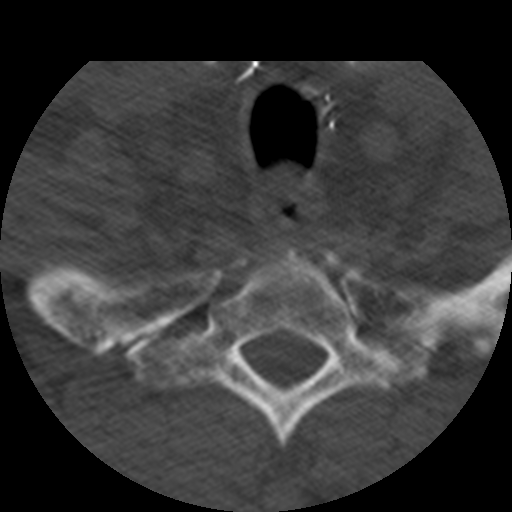
[im 46/138  bone]
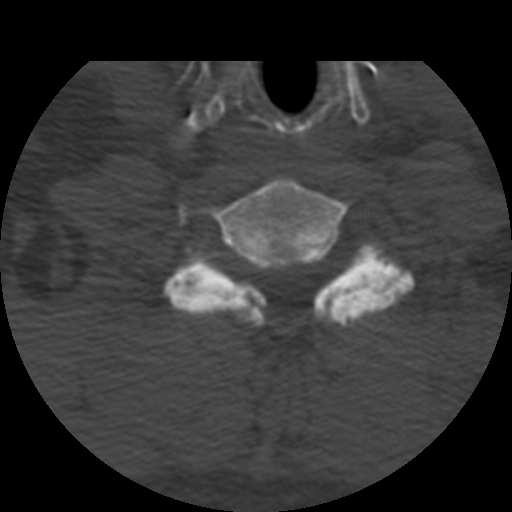
[im 69/138  bone]
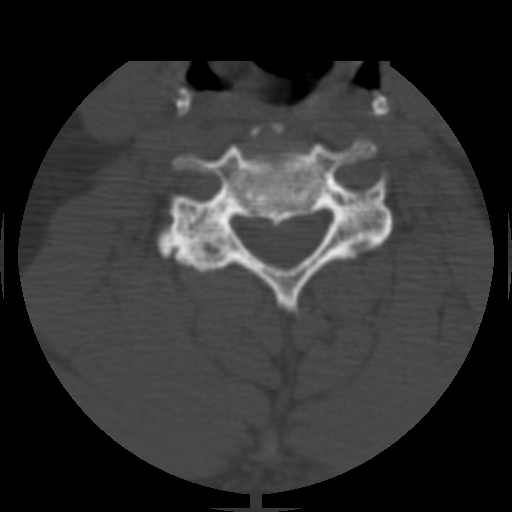
[im 92/138  bone]
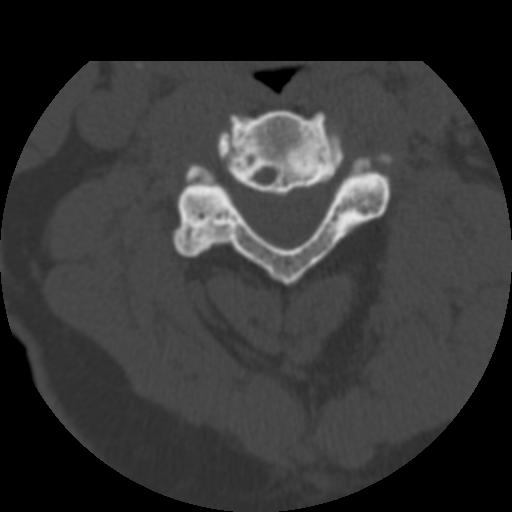
[im 115/138  soft-tissue]
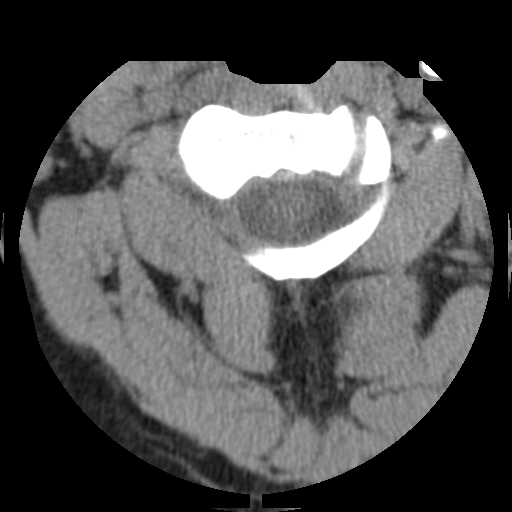
[im 115/138  bone]
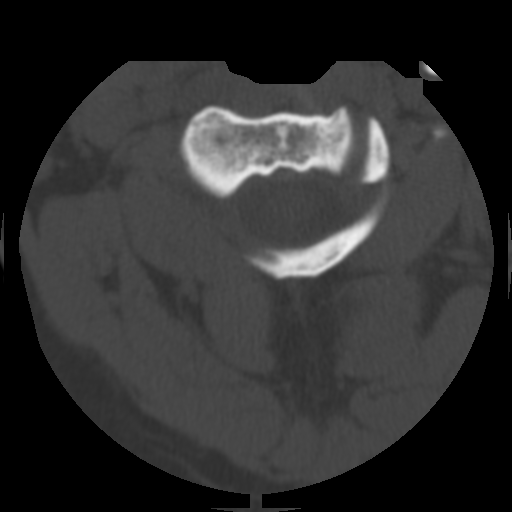

[Series 3: bone windows · axial · 0.23mm/px · z∈[+50,+165]mm · 5 of 138 slices shown]
[im 23/138  bone]
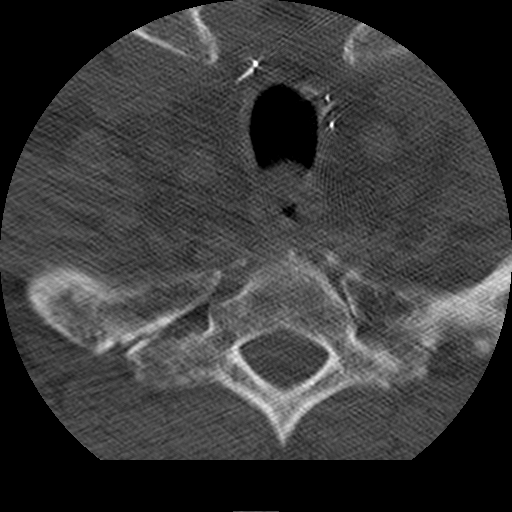
[im 46/138  bone]
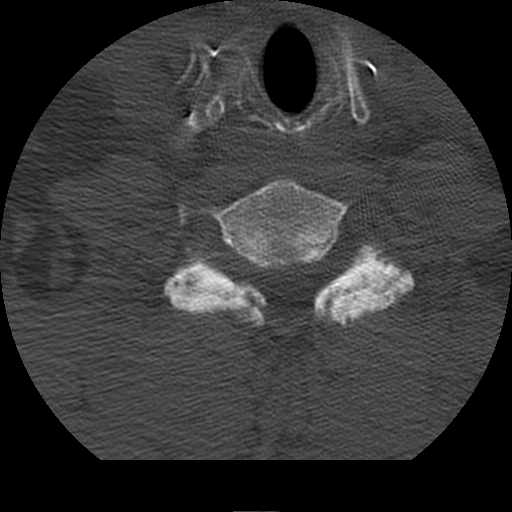
[im 69/138  bone]
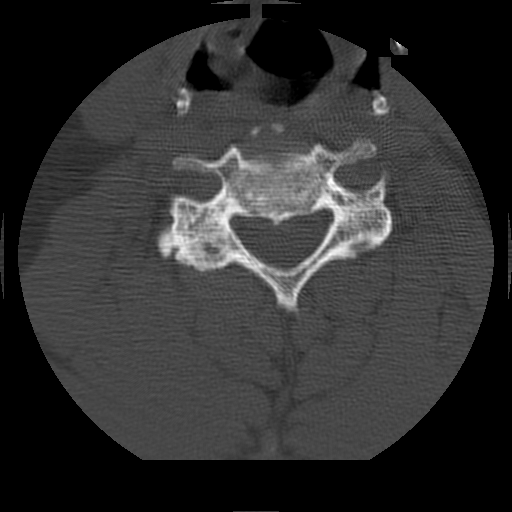
[im 92/138  bone]
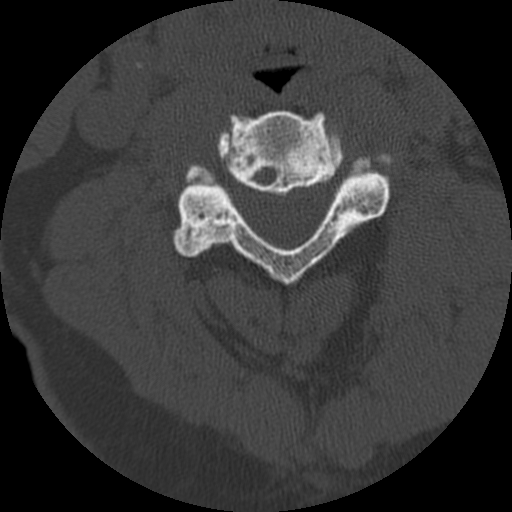
[im 115/138  bone]
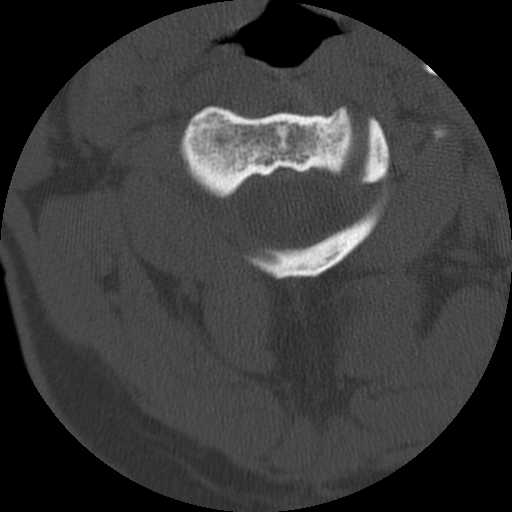

[Series 400: coronal · coronal · 0.34mm/px · 6 of 40 slices shown]
[im 6/40  soft-tissue]
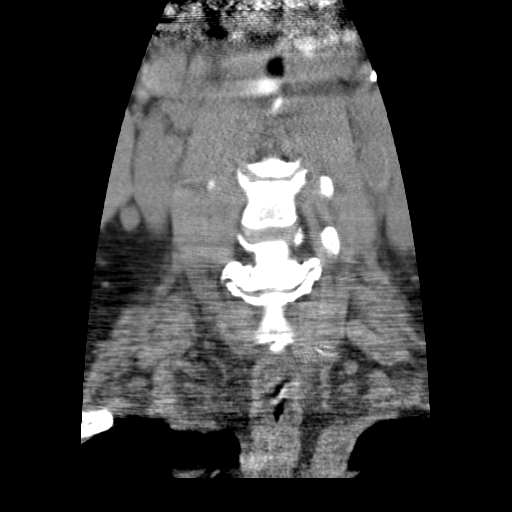
[im 7/40  bone]
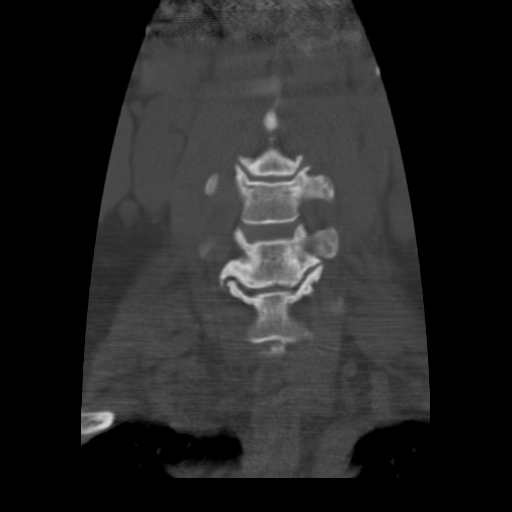
[im 14/40  bone]
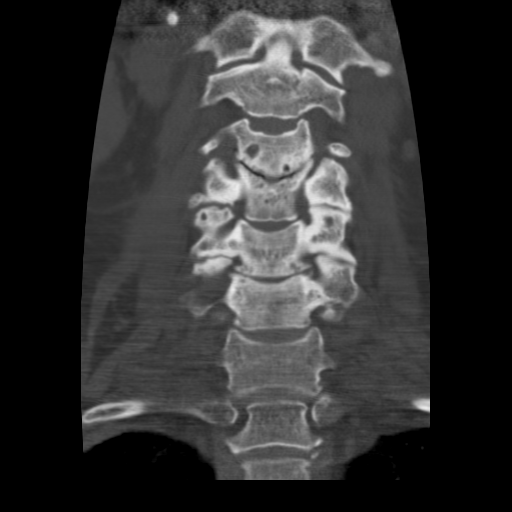
[im 20/40  bone]
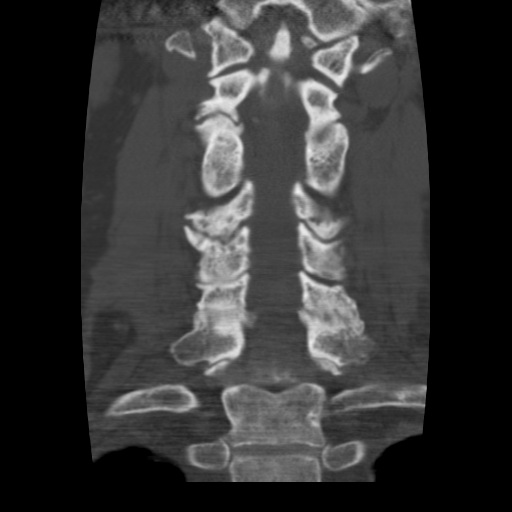
[im 27/40  bone]
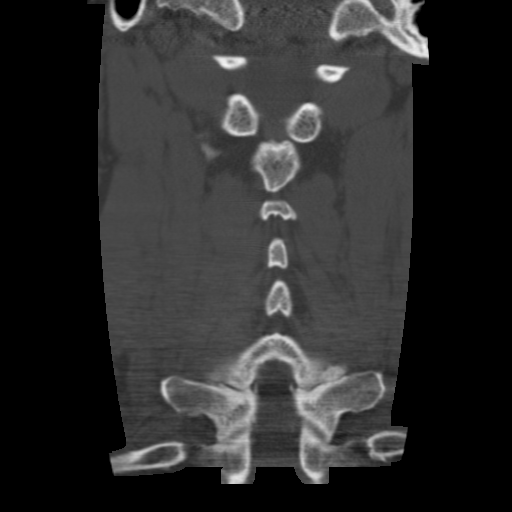
[im 33/40  bone]
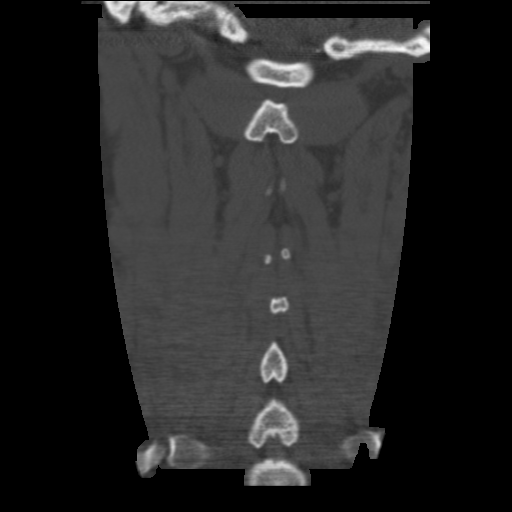

[16 of 28 positions shown; findings below may reference images not displayed]

FINDINGS: There is no abnormality at the foramen magnum.  There is
ordinary degenerative change between the anterior arch of C1 and
the dens.  Spinal canal is widely patent.

At C2-3, there is facet arthropathy worse on the right than the
left.  There is uncovertebral spurring, worse on the right than the
left.  The central canal is widely patent.  There is foraminal
narrowing on the right that could effect the C3 nerve root.

At C3-4, there is retrolisthesis of 2 mm.  The disc is degenerated
with loss of height.  There are endplate osteophytes.  There is a
broad-based disc herniation.  This narrows the spinal canal and
could compress the cord.  There is neural foraminal stenosis
because of uncovertebral encroachment bilaterally.  Either C4 nerve
root could be affected.

At C4-5, there is facet arthropathy worse on the right than the
left.  There is 1-2 mm of anterolisthesis.  There is uncovertebral
degeneration worse on the right.  There is shallow protrusion of
disc material.  The cord does not appear grossly compressed.  There
is neural foraminal stenosis, right worse than left.  The right C5
nerve root could be compressed.

At C5-6, there is facet arthropathy worse on the right.  There is
chronic disc degeneration with loss of height, endplate osteophytes
and shallow protrusion of disc material.  The canal is narrowed and
the cord is possibly deformed.  There is bilateral neural foraminal
stenosis, worse on the right than the left, that could compress
either C6 nerve root.

At C6-7, there is facet arthropathy bilaterally.  There are small
endplate osteophytes and mild bulging of the disc.  The canal is
moderately narrowed.  There is severe foraminal compromise on the
right and moderate foraminal narrowing on the left.

At C7-T1, there is mild facet degeneration but no slippage.  There
are small endplate osteophytes.  There is mild narrowing of the
canal and foramina.
IMPRESSION: Complex multi level disease.  Potential for central canal stenosis
is most pronounced at C3-4 followed by C4-5 and C5-6.

Potential for compressive foraminal stenosis is seen throughout the
region as outlined above.

## 2008-03-03 ENCOUNTER — Ambulatory Visit (HOSPITAL_BASED_OUTPATIENT_CLINIC_OR_DEPARTMENT_OTHER): Admission: RE | Admit: 2008-03-03 | Discharge: 2008-03-03 | Payer: Self-pay | Admitting: Orthopaedic Surgery

## 2008-07-23 ENCOUNTER — Ambulatory Visit: Payer: Self-pay | Admitting: *Deleted

## 2009-07-19 ENCOUNTER — Encounter (HOSPITAL_COMMUNITY): Admission: RE | Admit: 2009-07-19 | Discharge: 2009-09-30 | Payer: Self-pay | Admitting: Endocrinology

## 2009-07-21 ENCOUNTER — Ambulatory Visit: Payer: Self-pay | Admitting: Vascular Surgery

## 2010-07-10 ENCOUNTER — Encounter: Payer: Self-pay | Admitting: Endocrinology

## 2010-08-03 ENCOUNTER — Encounter (HOSPITAL_COMMUNITY)
Admission: RE | Admit: 2010-08-03 | Discharge: 2010-08-03 | Disposition: A | Discharge status: Home / Self Care | Payer: MEDICARE | Source: Ambulatory Visit | Attending: Orthopaedic Surgery | Admitting: Orthopaedic Surgery

## 2010-08-03 ENCOUNTER — Other Ambulatory Visit (HOSPITAL_COMMUNITY): Payer: Self-pay | Admitting: Orthopaedic Surgery

## 2010-08-03 DIAGNOSIS — Z01811 Encounter for preprocedural respiratory examination: Secondary | ICD-10-CM

## 2010-08-03 DIAGNOSIS — Z01818 Encounter for other preprocedural examination: Secondary | ICD-10-CM | POA: Insufficient documentation

## 2010-08-03 DIAGNOSIS — Z01812 Encounter for preprocedural laboratory examination: Secondary | ICD-10-CM | POA: Insufficient documentation

## 2010-08-03 LAB — SURGICAL PCR SCREEN: MRSA, PCR: NEGATIVE

## 2010-08-03 LAB — CBC
Hemoglobin: 14.7 g/dL (ref 13.0–17.0)
MCH: 30.1 pg (ref 26.0–34.0)
MCV: 86.7 fL (ref 78.0–100.0)
Platelets: 228 10*3/uL (ref 150–400)
WBC: 10.6 10*3/uL — ABNORMAL HIGH (ref 4.0–10.5)

## 2010-08-03 LAB — BASIC METABOLIC PANEL
CO2: 30 mEq/L (ref 19–32)
GFR calc non Af Amer: >60 mL/min (ref >60)
Potassium: 3.7 mEq/L (ref 3.5–5.1)

## 2010-08-03 IMAGING — CR DG CHEST 2V
2 series · 2 of 2 positions shown · non-contrast
Comparison: None

CLINICAL DATA: Preoperative respiratory examination for left knee
surgery.

CHEST - 2 VIEW

[view not recorded (1 of 2)]
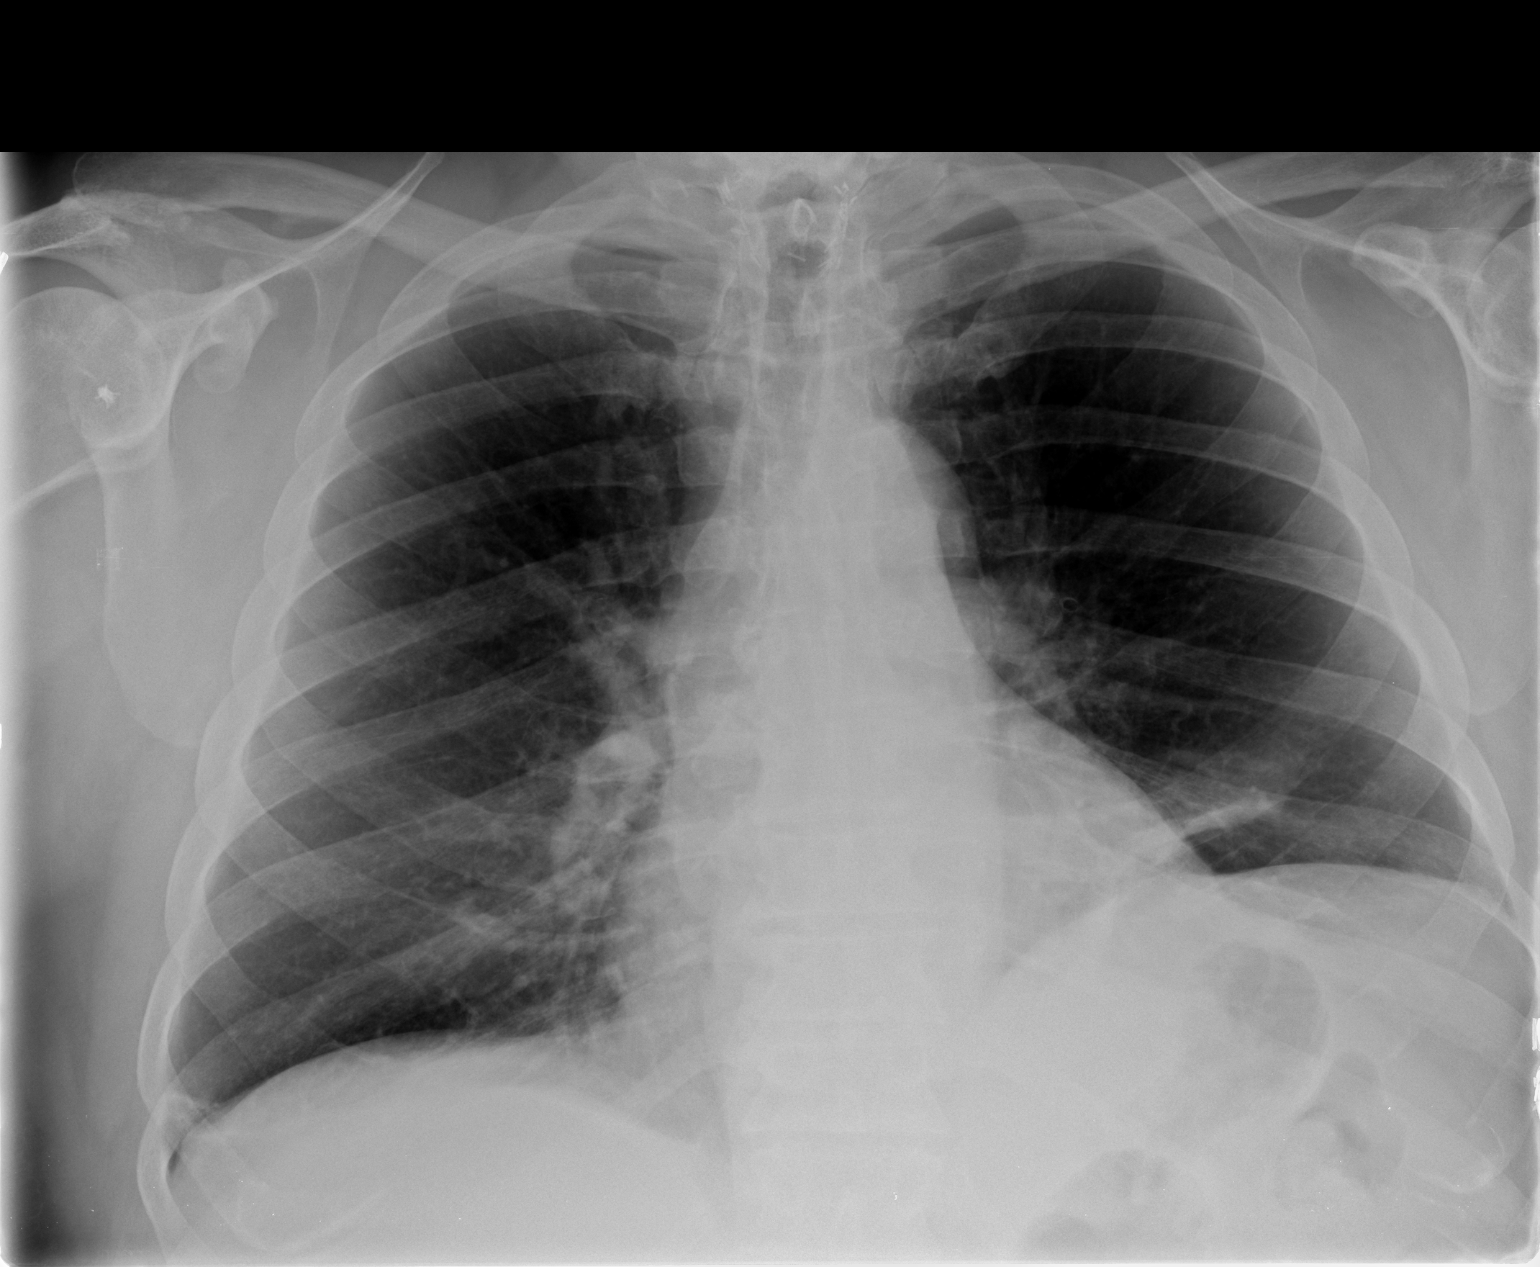

[view not recorded (2 of 2)]
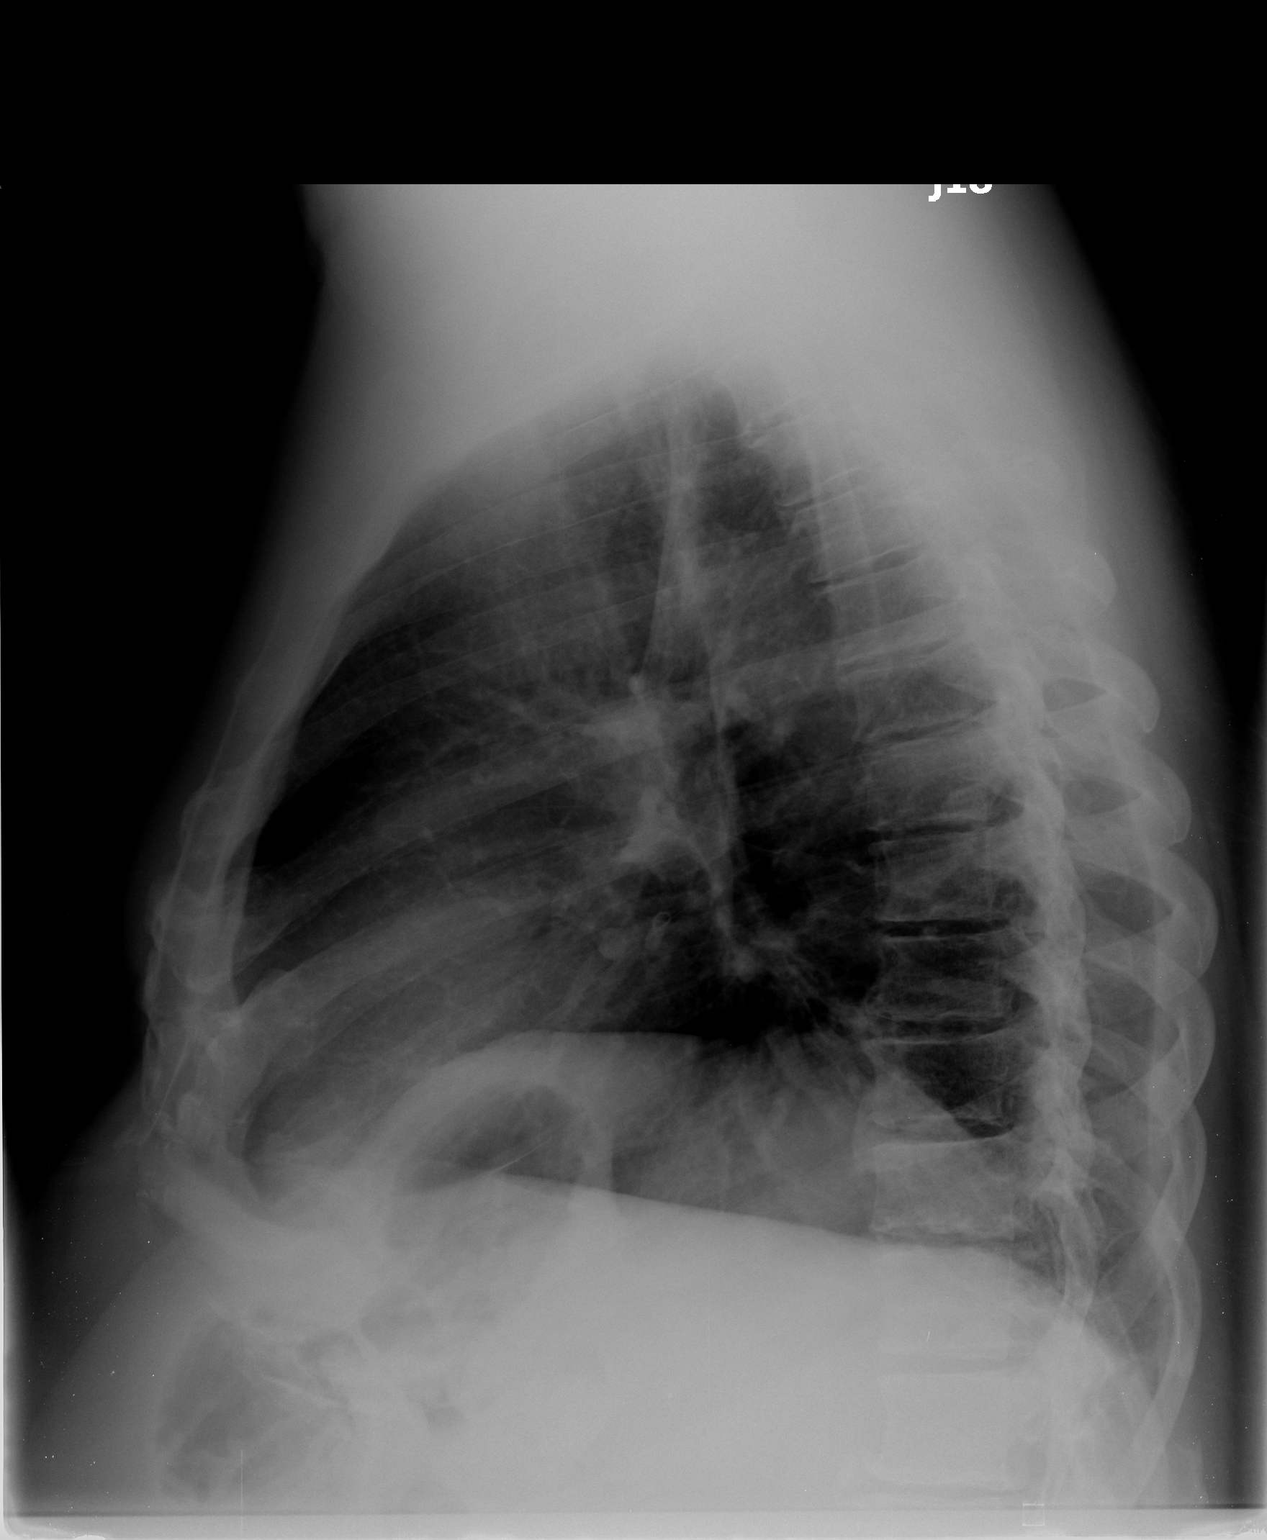

[2 of 2 positions shown; findings below may reference images not displayed]

FINDINGS: The cardiomediastinal silhouette is unremarkable.
Subsegmental atelectasis versus scarring at the left lung base
noted.
There is no evidence of focal airspace disease, pulmonary edema,
pulmonary nodule/mass, pleural effusion, or pneumothorax.
No acute bony abnormalities are identified.
Surgical clips overlying the neck and evidence of right shoulder
surgery noted.
IMPRESSION: Left basilar subsegmental atelectasis versus scarring.

No other significant abnormalities.

## 2010-08-09 ENCOUNTER — Inpatient Hospital Stay (HOSPITAL_COMMUNITY)
Admission: RE | Admit: 2010-08-09 | Discharge: 2010-08-14 | DRG: 470 | Disposition: A | Discharge status: Home Health | Payer: MEDICARE | Source: Ambulatory Visit | Attending: Orthopaedic Surgery | Admitting: Orthopaedic Surgery

## 2010-08-09 DIAGNOSIS — I10 Essential (primary) hypertension: Secondary | ICD-10-CM | POA: Diagnosis present

## 2010-08-09 DIAGNOSIS — E039 Hypothyroidism, unspecified: Secondary | ICD-10-CM | POA: Diagnosis present

## 2010-08-09 DIAGNOSIS — E119 Type 2 diabetes mellitus without complications: Secondary | ICD-10-CM | POA: Diagnosis present

## 2010-08-09 DIAGNOSIS — M1710 Unilateral primary osteoarthritis, unspecified knee: Principal | ICD-10-CM | POA: Diagnosis present

## 2010-08-09 DIAGNOSIS — G4730 Sleep apnea, unspecified: Secondary | ICD-10-CM | POA: Diagnosis present

## 2010-08-09 DIAGNOSIS — E7800 Pure hypercholesterolemia, unspecified: Secondary | ICD-10-CM | POA: Diagnosis present

## 2010-08-09 LAB — GLUCOSE, CAPILLARY: Glucose-Capillary: 134 mg/dL — ABNORMAL HIGH (ref 70–99)

## 2010-08-10 LAB — GLUCOSE, CAPILLARY
Glucose-Capillary: 149 mg/dL — ABNORMAL HIGH (ref 70–99)
Glucose-Capillary: 156 mg/dL — ABNORMAL HIGH (ref 70–99)

## 2010-08-10 LAB — BASIC METABOLIC PANEL
BUN: 14 mg/dL (ref 6–23)
CO2: 29 mEq/L (ref 19–32)
Calcium: 8.6 mg/dL (ref 8.4–10.5)
Creatinine, Ser: 1.09 mg/dL (ref 0.4–1.5)
GFR calc Af Amer: >60 mL/min (ref >60)

## 2010-08-10 LAB — CBC
HCT: 38.4 % — ABNORMAL LOW (ref 39.0–52.0)
Hemoglobin: 12.8 g/dL — ABNORMAL LOW (ref 13.0–17.0)
MCH: 29 pg (ref 26.0–34.0)
MCV: 86.9 fL (ref 78.0–100.0)
RBC: 4.42 MIL/uL (ref 4.22–5.81)
RDW: 14.2 % (ref 11.5–15.5)
WBC: 13.2 10*3/uL — ABNORMAL HIGH (ref 4.0–10.5)

## 2010-08-10 LAB — PROTIME-INR: Prothrombin Time: 14 seconds (ref 11.6–15.2)

## 2010-08-11 LAB — BASIC METABOLIC PANEL
Chloride: 95 mEq/L — ABNORMAL LOW (ref 96–112)
GFR calc Af Amer: >60 mL/min (ref >60)
Potassium: 3.2 mEq/L — ABNORMAL LOW (ref 3.5–5.1)
Sodium: 136 mEq/L (ref 135–145)

## 2010-08-11 LAB — CBC
Hemoglobin: 11.4 g/dL — ABNORMAL LOW (ref 13.0–17.0)
MCV: 85 fL (ref 78.0–100.0)
Platelets: 158 10*3/uL (ref 150–400)
RBC: 3.94 MIL/uL — ABNORMAL LOW (ref 4.22–5.81)
WBC: 16.8 10*3/uL — ABNORMAL HIGH (ref 4.0–10.5)

## 2010-08-11 LAB — GLUCOSE, CAPILLARY
Glucose-Capillary: 151 mg/dL — ABNORMAL HIGH (ref 70–99)
Glucose-Capillary: 129 mg/dL — ABNORMAL HIGH (ref 70–99)
Glucose-Capillary: 134 mg/dL — ABNORMAL HIGH (ref 70–99)

## 2010-08-11 LAB — PROTIME-INR: INR: 1.6 — ABNORMAL HIGH (ref 0.00–1.49)

## 2010-08-12 ENCOUNTER — Encounter: Payer: Self-pay | Admitting: Vascular Surgery

## 2010-08-12 ENCOUNTER — Other Ambulatory Visit: Payer: Self-pay

## 2010-08-12 LAB — GLUCOSE, CAPILLARY: Glucose-Capillary: 124 mg/dL — ABNORMAL HIGH (ref 70–99)

## 2010-08-12 LAB — PROTIME-INR: Prothrombin Time: 20.9 seconds — ABNORMAL HIGH (ref 11.6–15.2)

## 2010-08-12 LAB — BASIC METABOLIC PANEL
Calcium: 8.8 mg/dL (ref 8.4–10.5)
Creatinine, Ser: 1.02 mg/dL (ref 0.4–1.5)
GFR calc Af Amer: >60 mL/min (ref >60)
GFR calc non Af Amer: >60 mL/min (ref >60)
Sodium: 136 mEq/L (ref 135–145)

## 2010-08-12 LAB — URIC ACID: Uric Acid, Serum: 7 mg/dL (ref 4.0–7.8)

## 2010-08-13 LAB — GLUCOSE, CAPILLARY
Glucose-Capillary: 136 mg/dL — ABNORMAL HIGH (ref 70–99)
Glucose-Capillary: 141 mg/dL — ABNORMAL HIGH (ref 70–99)
Glucose-Capillary: 121 mg/dL — ABNORMAL HIGH (ref 70–99)

## 2010-08-14 LAB — GLUCOSE, CAPILLARY: Glucose-Capillary: 126 mg/dL — ABNORMAL HIGH (ref 70–99)

## 2010-08-14 LAB — PROTIME-INR: INR: 2.13 — ABNORMAL HIGH (ref 0.00–1.49)

## 2010-08-24 NOTE — Discharge Summary (Addendum)
NAME:  [name], [name] NO.:  [id number]  MEDICAL RECORD NO.:  [id number]           PATIENT TYPE:  I  LOCATION:  5023                         FACILITY:  [facility]  PHYSICIAN:  Lubertha Basque. Joanna Borawski, M.D.DATE OF BIRTH:  1940/11/25  DATE OF ADMISSION:  08/09/2010 DATE OF DISCHARGE:  08/13/2010                              DISCHARGE SUMMARY   ADMITTING DIAGNOSES: 1. Left knee end-stage degenerative joint disease. 2. Sleep apnea. 3. Non-insulin-dependent diabetes. 4. Hypercholesterolemia. 5. Hypothyroidism.  DISCHARGE DIAGNOSES: 1. Left knee end-stage degenerative joint disease. 2. Sleep apnea. 3. Non-insulin-dependent diabetes. 4. Hypercholesterolemia. 5. Hypothyroidism.  OPERATION:  Left total knee replacement.  BRIEF HISTORY:  [name] is a 70 year old white male patient well known to our practice who has had increasing left knee pain, trouble when he ambulates and trouble sleeping at nighttime.  He has failed oral antiinflammatory medicines and corticosteroid injections and his x-rays revealed end-stage bone-on-bone DJD and we have discussed treatment options that being total knee replacement surgery.  PERTINENT LABORATORY AND X-RAY FINDINGS:  At the time of dictation, his CBC, hemoglobin 11.4, hematocrit 33.5, WBCs 16.8, platelets 158.  Chem- 7; sodium 136, potassium 3.2, BUN 16, SCr 1.02, glucose 136.  INRs were checked regularly and last was 1.78.  COURSE IN THE HOSPITAL:  He was admitted postoperatively, placed on variety of p.o. and IV analgesics for pain, antispasmodics, and then appropriate stool softeners, and then he is kept on his home medicines. We had instituted physical therapy, weightbearing as tolerated, knee- high TEDs, incentive spirometry, use of a CPM machine, pharmacy for low- dose Coumadin, and then Lovenox protocol for DVT prophylaxis.  [name] also suffered from sleep apnea and we had ordered a CPM machine at his home settings and then  monitored his O2 and breathing carefully.  On the first day postop, he was voiding well without any significant difficulties.  His IV fluids were backed off.  He was on appropriate amount of pain meds.  We adjusted his pain medicines appropriately to give him some relief along with some muscle relaxers.  He was able to walk with Physical Therapy but was slow going because of pain and they felt that some additional time with therapy would be beneficial for him. They suggested discharge of planning to anticipate as Saturday is being the departure day.  His dressing was changed.  His wound was noted to be benign.  His drain was pulled.  Calves soft and nontender.  Motion 5-75 degrees on the CPM machine was done without significant difficulty. Throughout his hospital stay, his respiration rate was up some, but his pulse ox on room air was in the middle-to-high 90s and lungs were noted to be clear and abdomen was soft.  CONDITION ON DISCHARGE:  Improved.  FOLLOWUP:  He will remain on a low-sodium heart-healthy diet.  Continue with incentive spirometry.  He had arranged for home physical therapy which were consisted of 2-3 times a week for 4 weeks as well as ProTime to be drawn and then adjustment of his Coumadin that he would be on for a total of 2 weeks.  He  was also given prescriptions for pain medicines, antispasmodics, and Coumadin prior to the discharge and the rest of his medicines could be found in the discharge med management sheet.  Any sign of infection which would be increasing redness, drainage, swelling, or pain, he can call our office [phone] for that appointment and then also to be seen in 2 weeks postoperatively.  The use of a CPM machine will also be instituted at home by  [facility].     Lindwood Qua, P.A.   ______________________________ Lubertha Basque. [name], M.D.    MC/MEDQ  D:  08/12/2010  T:  08/13/2010  Job:  [id number]  Electronically Signed by Lindwood Qua P.A. on 08/13/2010 09:48:51 AM Electronically Signed by Marcene Corning M.D. on 08/23/2010 03:45:37 PM Electronically Signed by Marcene Corning M.D. on 08/23/2010 03:55:06 PM Electronically Signed by Marcene Corning M.D. on 08/23/2010 04:02:51 PM Electronically Signed by Marcene Corning M.D. on 08/23/2010 04:34:01 PM Electronically Signed by Marcene Corning M.D. on 08/23/2010 05:05:33 PM Electronically Signed by Marcene Corning M.D. on 08/23/2010 05:05:33 PM Electronically Signed by Marcene Corning M.D. on 08/23/2010 05:32:57 PM Electronically Signed by Marcene Corning M.D. on 08/23/2010 05:32:57 PM Electronically Signed by Marcene Corning M.D. on 08/23/2010 06:15:29 PM Electronically Signed by Marcene Corning M.D. on 08/23/2010 06:23:00 PM Electronically Signed by Marcene Corning M.D. on 08/23/2010 07:36:57 PM

## 2010-08-24 NOTE — Op Note (Addendum)
NAME:  [name], [name]                ACCOUNT NO.:  [id number]  MEDICAL RECORD NO.:  [id number]           PATIENT TYPE:  I  LOCATION:  5023                         FACILITY:  [facility]  PHYSICIAN:  Lubertha Basque. Gianella Chismar, M.D.DATE OF BIRTH:  [birth year]  DATE OF PROCEDURE:  08/09/2010 DATE OF DISCHARGE:                              OPERATIVE REPORT   PREOPERATIVE DIAGNOSIS:  Left knee degenerative joint disease.  POSTOPERATIVE DIAGNOSIS:  Left knee degenerative joint disease.  PROCEDURE:  Left total knee replacement.  ANESTHESIA:  General and block.  ATTENDING SURGEON:  Lubertha Basque. [name], MD  ASSISTANT:  Lindwood Qua, PA   INDICATION FOR PROCEDURE:  The patient is a 70 year old male with a long history of a painful left knee.  This has persisted despite oral anti- inflammatories and injectables.  He has advanced degenerative change on x-ray.  He has pain which limits his ability to rest and walk, and he is offered a knee replacement.  Informed operative consent was obtained after discussion of possible complications including reaction to anesthesia, infection, DVT, PE, and death.  The importance of the postoperative rehabilitation protocol to optimize result was stressed with the patient.  SUMMARY, FINDINGS, AND PROCEDURE:  Under general anesthesia and a block, through a standard longitudinal anterior incision, a left knee replacement was performed.  He had advanced degenerative change but excellent bone quality.  We addressed his problem with a cemented DePuy system using a large femur, 5 MBT tibial tray, 10 deep dish insert, and 41 all- polyethylene patella.  Zinacef antibiotic was included in the cement. Bryna Colander assisted throughout and was invaluable to the completion of the case, in that he helped position and retract while I performed the procedure.  He also closed simultaneously to help minimize OR time.  DESCRIPTION OF THE PROCEDURE:  The patient was taken to the  operating suite where general anesthetic was applied without difficulty.  He was also given a block in the preanesthesia area.  He was positioned supine and prepped and draped in normal sterile fashion.  After administration of IV Kefzol, the left leg was elevated, exsanguinated, tourniquet inflated about the thigh.  A longitudinal anterior incision was made with dissection down to the extensor mechanism.  A medial parapatellar incision was made.  All appropriate anti-infective measures were used including closed hooded  exhaust systems for each member of the surgical team, Betadine impregnated drape, and the preoperative IV antibiotic. The kneecap was flipped and the knee flexed.  Some residual meniscal tissues were removed along with the ACL and PCL.  Some large osteophytes were also removed.  An intramedullary guide was placed in the tibia to make a roughly flat cut.  A second intramedullary guide was placed in the femur to make anterior and posterior cuts creating a flexion gap of 10 mm.  Another intramedullary guide was placed in the femur to make a distal cut creating equal extension gap of 10 mm balancing the knee. The femur sized to a large and the tibia to a 5 with appropriate guides placed and utilized.  We did ream for the short stem on the  MBT tray for the tibia.  The patella was cut down in thickness by about 11 mm to 15 of thickness.  This sized to a 41 with the appropriate guide placed and utilized.  A trial reduction was done with these components and the 10 spacer.  He easily came to slight hyperextension and flexed well.  The trial components were removed followed by pulsatile lavage, irrigation of all three cut bony surfaces.  Cement was mixed including Zinacef and was pressurized onto the bones followed by placement of the aforementioned DePuy components.  Excess cement was trimmed, and pressure was held on the components until the cement had hardened.  The tourniquet  was deflated and a small amount of bleeding was easily controlled with pressure and Bovie cautery.  The knee was irrigated followed by placement of a drain exiting superolaterally.  The extensor mechanism was reapproximated with #1 Vicryl in an interrupted fashion followed by subcutaneous reapproximation with 0 and 2-0 undyed Vicryl and skin closure with staples.  Adaptic was applied followed by dry gauze and a loose Ace wrap.  Estimated blood loss and fluids, as well as accurate tourniquet time, can be obtained from anesthesia records.  DISPOSITION:  The patient was extubated in the operating room and taken to recovery room in stable condition.  He is admitted to the Orthopedic Surgery Service for appropriate postop care to include perioperative antibiotics and Coumadin plus Lovenox for DVT prophylaxis.     Lubertha Basque [name], M.D.     PGD/MEDQ  D:  08/09/2010  T:  08/10/2010  Job:  [id number]  Electronically Signed by Marcene Corning M.D. on 08/23/2010 07:32:31 PM

## 2010-09-06 ENCOUNTER — Other Ambulatory Visit: Payer: Self-pay

## 2010-09-09 ENCOUNTER — Inpatient Hospital Stay (HOSPITAL_COMMUNITY)
Admission: RE | Admit: 2010-09-09 | Discharge: 2010-09-12 | DRG: 468 | Disposition: A | Discharge status: Home Health | Payer: MEDICARE | Source: Ambulatory Visit | Attending: Orthopedic Surgery | Admitting: Orthopedic Surgery

## 2010-09-09 ENCOUNTER — Inpatient Hospital Stay (HOSPITAL_COMMUNITY): Payer: MEDICARE

## 2010-09-09 DIAGNOSIS — Z87891 Personal history of nicotine dependence: Secondary | ICD-10-CM

## 2010-09-09 DIAGNOSIS — T84029A Dislocation of unspecified internal joint prosthesis, initial encounter: Principal | ICD-10-CM | POA: Diagnosis present

## 2010-09-09 DIAGNOSIS — Z7982 Long term (current) use of aspirin: Secondary | ICD-10-CM

## 2010-09-09 DIAGNOSIS — Z96659 Presence of unspecified artificial knee joint: Secondary | ICD-10-CM

## 2010-09-09 DIAGNOSIS — I10 Essential (primary) hypertension: Secondary | ICD-10-CM | POA: Diagnosis present

## 2010-09-09 DIAGNOSIS — J449 Chronic obstructive pulmonary disease, unspecified: Secondary | ICD-10-CM | POA: Diagnosis present

## 2010-09-09 DIAGNOSIS — K219 Gastro-esophageal reflux disease without esophagitis: Secondary | ICD-10-CM | POA: Diagnosis present

## 2010-09-09 DIAGNOSIS — Z7901 Long term (current) use of anticoagulants: Secondary | ICD-10-CM

## 2010-09-09 DIAGNOSIS — M129 Arthropathy, unspecified: Secondary | ICD-10-CM | POA: Diagnosis present

## 2010-09-09 DIAGNOSIS — Z79899 Other long term (current) drug therapy: Secondary | ICD-10-CM

## 2010-09-09 DIAGNOSIS — Y831 Surgical operation with implant of artificial internal device as the cause of abnormal reaction of the patient, or of later complication, without mention of misadventure at the time of the procedure: Secondary | ICD-10-CM | POA: Diagnosis present

## 2010-09-09 DIAGNOSIS — J4489 Other specified chronic obstructive pulmonary disease: Secondary | ICD-10-CM | POA: Diagnosis present

## 2010-09-09 DIAGNOSIS — G4733 Obstructive sleep apnea (adult) (pediatric): Secondary | ICD-10-CM | POA: Diagnosis present

## 2010-09-09 LAB — CBC
HCT: 40 % (ref 39.0–52.0)
MCH: 29.8 pg (ref 26.0–34.0)
MCHC: 34 g/dL (ref 30.0–36.0)
MCV: 87.7 fL (ref 78.0–100.0)
Platelets: 227 10*3/uL (ref 150–400)
RDW: 14.9 % (ref 11.5–15.5)
WBC: 8 10*3/uL (ref 4.0–10.5)

## 2010-09-09 LAB — URINALYSIS, ROUTINE W REFLEX MICROSCOPIC
Bilirubin Urine: NEGATIVE
Glucose, UA: NEGATIVE mg/dL
Hgb urine dipstick: NEGATIVE
Ketones, ur: NEGATIVE mg/dL
Nitrite: NEGATIVE
Specific Gravity, Urine: 1.019 (ref 1.005–1.030)
pH: 5.5 (ref 5.0–8.0)

## 2010-09-09 LAB — BASIC METABOLIC PANEL
CO2: 32 mEq/L (ref 19–32)
Chloride: 97 mEq/L (ref 96–112)
Creatinine, Ser: 1.04 mg/dL (ref 0.4–1.5)
GFR calc Af Amer: >60 mL/min (ref >60)
Sodium: 138 mEq/L (ref 135–145)

## 2010-09-09 LAB — GLUCOSE, CAPILLARY
Glucose-Capillary: 111 mg/dL — ABNORMAL HIGH (ref 70–99)
Glucose-Capillary: 110 mg/dL — ABNORMAL HIGH (ref 70–99)
Glucose-Capillary: 111 mg/dL — ABNORMAL HIGH (ref 70–99)

## 2010-09-09 LAB — DIFFERENTIAL
Eosinophils Absolute: 0.5 10*3/uL (ref 0.0–0.7)
Eosinophils Relative: 6 % — ABNORMAL HIGH (ref 0–5)
Lymphocytes Relative: 30 % (ref 12–46)
Lymphs Abs: 2.4 10*3/uL (ref 0.7–4.0)
Monocytes Absolute: 0.6 10*3/uL (ref 0.1–1.0)
Monocytes Relative: 8 % (ref 3–12)

## 2010-09-09 LAB — GRAM STAIN

## 2010-09-09 LAB — SURGICAL PCR SCREEN: Staphylococcus aureus: NEGATIVE

## 2010-09-09 LAB — TYPE AND SCREEN

## 2010-09-09 LAB — ABO/RH: ABO/RH(D): A NEG

## 2010-09-09 IMAGING — CR DG KNEE 1-2V PORT*L*
2 series · 2 of 2 positions shown · non-contrast
Comparison: None.

CLINICAL DATA: Postop

PORTABLE LEFT KNEE - 1-2 VIEW

[ap/obl knee]
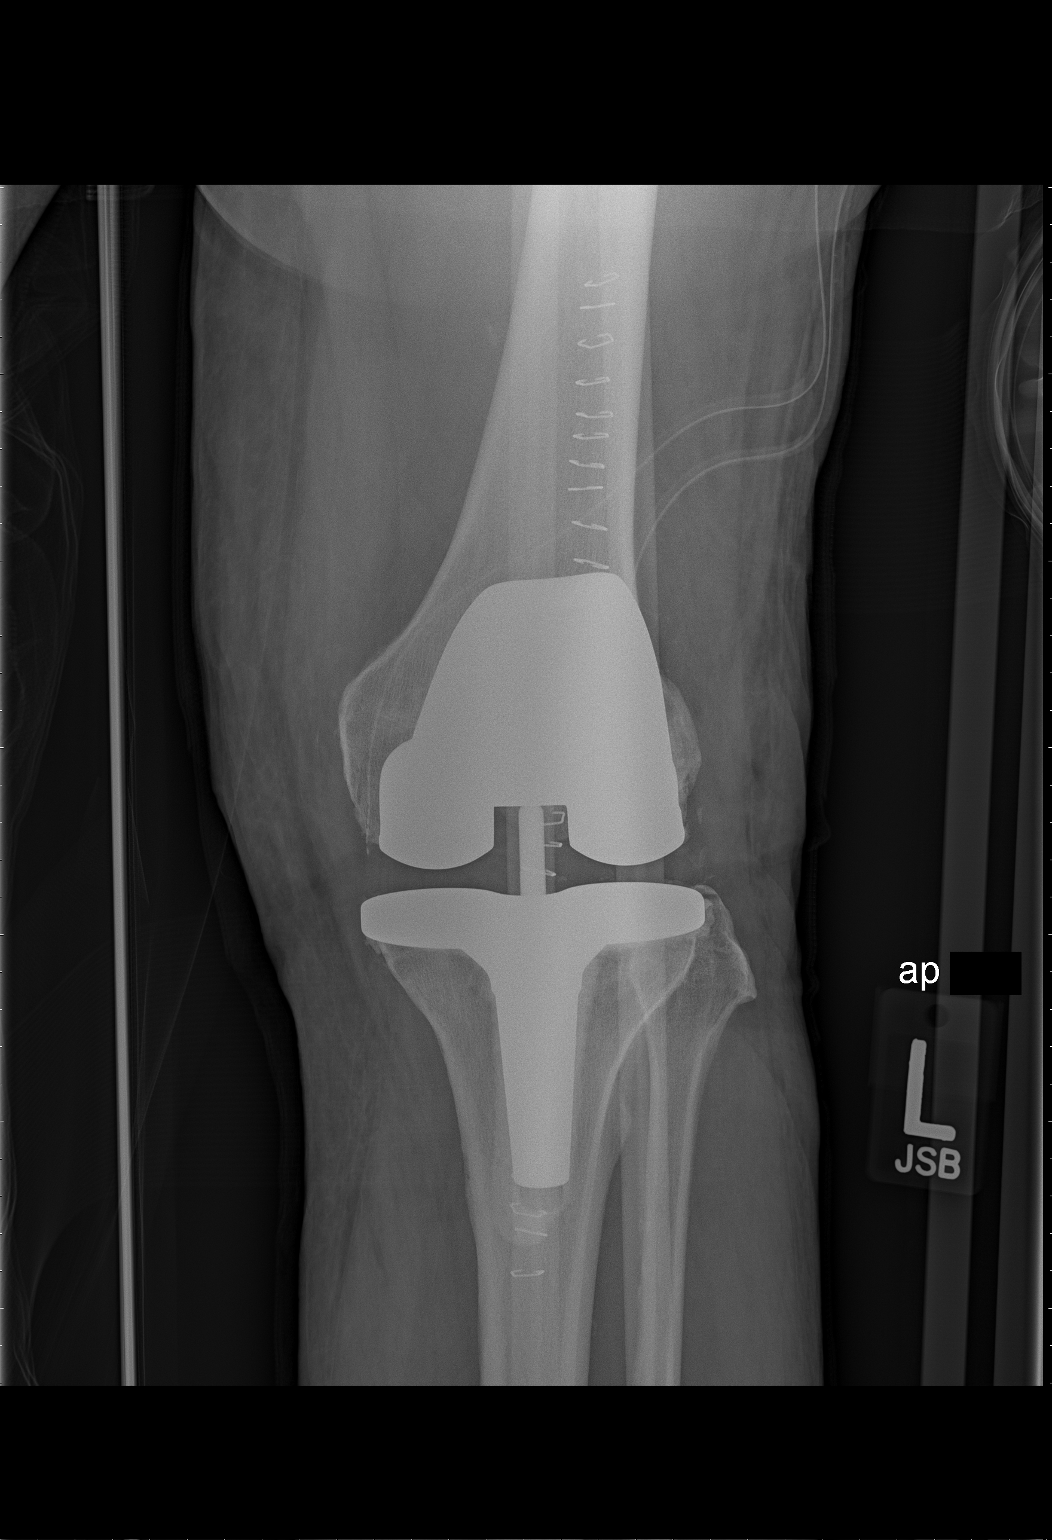

[knee lat]
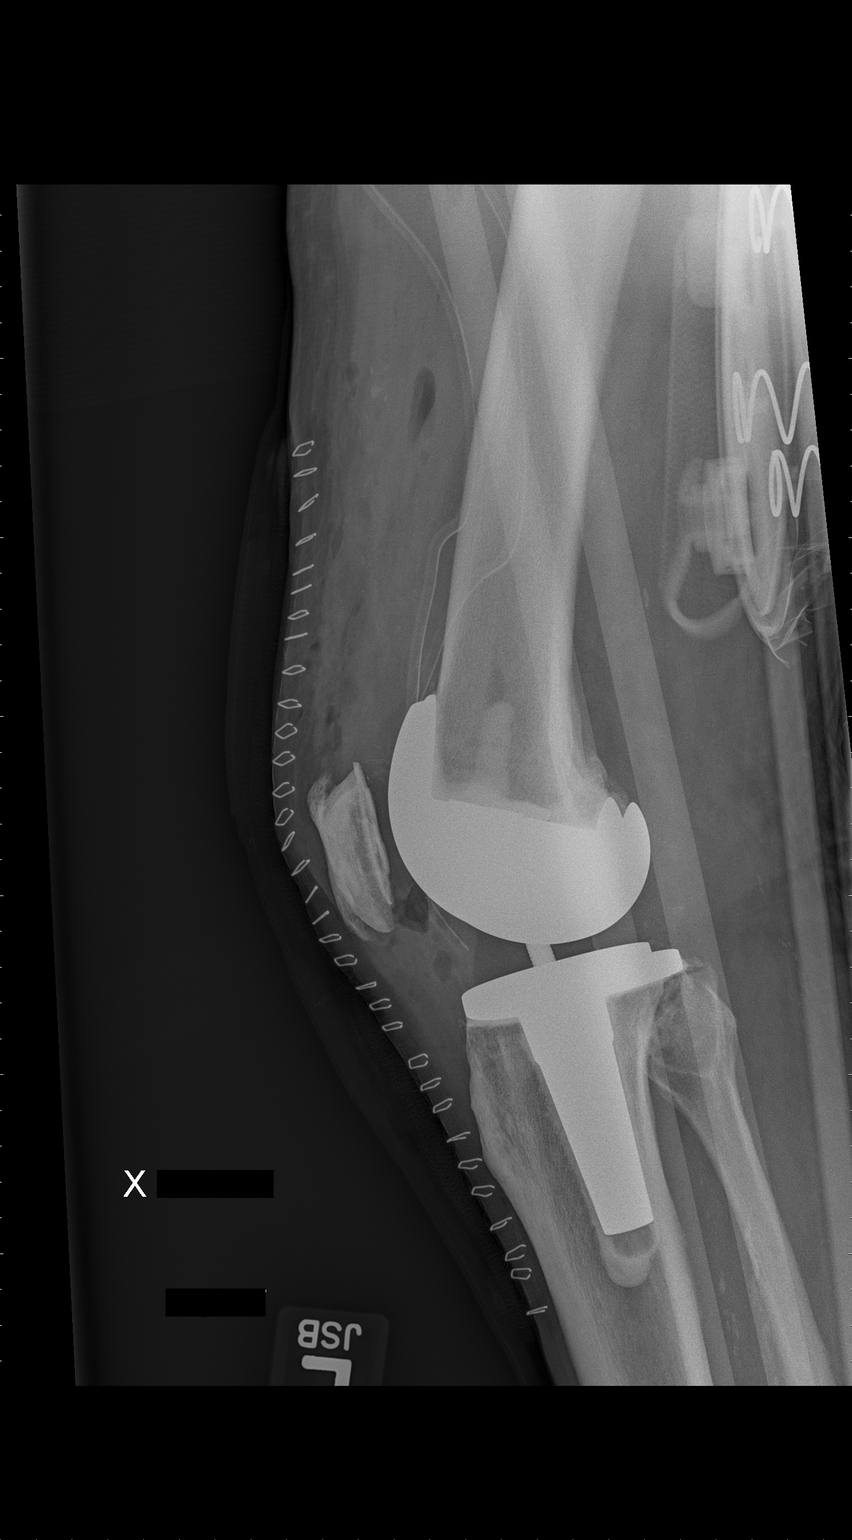

[2 of 2 positions shown; findings below may reference images not displayed]

FINDINGS: Two views of the left knee submitted.  There is a left
knee prosthesis in anatomic alignment.  Postsurgical changes are
noted.  Post surgical drains are noted.  Midline anterior skin
staples.
IMPRESSION: Left knee prosthesis in anatomic alignment.  Postsurgical changes
are noted.

## 2010-09-10 LAB — CBC
MCHC: 32.9 g/dL (ref 30.0–36.0)
Platelets: 197 10*3/uL (ref 150–400)
RDW: 14.9 % (ref 11.5–15.5)
WBC: 9.2 10*3/uL (ref 4.0–10.5)

## 2010-09-10 LAB — GLUCOSE, CAPILLARY
Glucose-Capillary: 139 mg/dL — ABNORMAL HIGH (ref 70–99)
Glucose-Capillary: 149 mg/dL — ABNORMAL HIGH (ref 70–99)
Glucose-Capillary: 131 mg/dL — ABNORMAL HIGH (ref 70–99)
Glucose-Capillary: 132 mg/dL — ABNORMAL HIGH (ref 70–99)

## 2010-09-10 LAB — BASIC METABOLIC PANEL
BUN: 13 mg/dL (ref 6–23)
Calcium: 8.7 mg/dL (ref 8.4–10.5)
Creatinine, Ser: 1.08 mg/dL (ref 0.4–1.5)
GFR calc Af Amer: >60 mL/min (ref >60)
GFR calc non Af Amer: >60 mL/min (ref >60)

## 2010-09-10 LAB — PROTIME-INR
INR: 1.05 (ref 0.00–1.49)
Prothrombin Time: 13.9 seconds (ref 11.6–15.2)

## 2010-09-11 LAB — CBC
Hemoglobin: 10.6 g/dL — ABNORMAL LOW (ref 13.0–17.0)
MCH: 29.3 pg (ref 26.0–34.0)
MCHC: 33.2 g/dL (ref 30.0–36.0)
RDW: 14.7 % (ref 11.5–15.5)

## 2010-09-11 LAB — GLUCOSE, CAPILLARY: Glucose-Capillary: 146 mg/dL — ABNORMAL HIGH (ref 70–99)

## 2010-09-11 LAB — TISSUE CULTURE: Culture: NO GROWTH

## 2010-09-11 LAB — PROTIME-INR: Prothrombin Time: 15.7 seconds — ABNORMAL HIGH (ref 11.6–15.2)

## 2010-09-11 NOTE — Op Note (Signed)
[name], [name] NO.:  [id number]  MEDICAL RECORD NO.:  [id number]           PATIENT TYPE:  I  LOCATION:  5009                         FACILITY:  [facility]  PHYSICIAN:  Feliberto Gottron. Turner Daniels, M.D.   DATE OF BIRTH:  [birth year]  DATE OF PROCEDURE: DATE OF DISCHARGE:                              OPERATIVE REPORT   PREOPERATIVE DIAGNOSIS:  Recurrent dislocation, left total knee.  POSTOPERATIVE DIAGNOSIS:  Recurrent dislocation, left total knee.  PROCEDURE:  Revision left total knee arthroplasty with removal of DePuy large LCS femur, revision to a DePuy TC3 #4 left femur and a 12.5 mm TC3 #4 bearing after removal of the LCS bearing which was 10 mm.  SURGEON:  Feliberto Gottron. Turner Daniels, M.D.  FIRST ASSISTANT:  [name] PA-C.  ANESTHESIA:  General endotracheal.  ESTIMATED BLOOD LOSS:  Minimal.  FLUID REPLACEMENT:  1500 cc of crystalloid.  DRAINS PLACED:  Two medium Hemovac and Foley catheter.  URINE OUTPUT:  300 cc.  TOURNIQUET TIME:  2 hours and 5 minutes.  INDICATIONS FOR PROCEDURE:  This is a 70 year old gentleman who underwent a primary left total knee arthroplasty about 1 month ago.  He did very well for 4 weeks and then started to develop recurrent straight anterior dislocations of the femur on the tibia and by radiograph it appeared that the LCS bearing was spanning out uncoupling laterally with the bearing spanning posteriorly, laterally and anteriorly medially. This happened once spontaneously and was reduced in the office.  He was placed in the brace.  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  Risks and benefits of surgery have been discussed, questions answered.  DESCRIPTION OF PROCEDURE:  The patient was identified by armband, received preoperative IV antibiotics in the holding area at [facility], taken to operating  room 1, appropriate anesthetic monitors were attached, general endotracheal anesthesia induced with the patient in supine position.  Tourniquet was applied high to the left thigh. Foley catheter inserted.  Foot positioner lateral post applied at the table.  It should be noted the knee was dislocated during this period of time.  The left lower extremity was then prepped and draped in usual sterile fashion from the ankle to the tourniquet.  Time-out procedure performed.  Limb wrapped with an Esmarch bandage, tourniquet inflated to 350 mmHg with the knee bent and we recreated the anterior midline incision and added about 2 cm proximally and distally to enhance our exposure.  Small bleeders in the skin and subcutaneous tissue identified and cauterized.  We recreated the medial parapatellar arthrotomy. Because of the thickness of the scar tissue, we were not really able to evert the patella, but it was able to sublux laterally giving [place] a good exposure of the joint.  The bearing itself did not appear to be fractured or broken.  We then set about removing hematoma and scar tissue from around the femoral component and noted there was no overt loosening and no evidence  of infection.  Gram stain and culture x2 were sent off, both tissue and fluid, occasional mono, no organisms.  With the knee hyperflexed and the patella subluxed laterally, we were able to extract the LCS bearing in one-piece and then set about checking the extension and flexion gaps of the total knee.  The extension gap was good for 10-12.5 bearing.  The flexion gap seemed to be a little excessive but not more than a couple of millimeters.  Because of this, we elected to go ahead and remove the LCS femoral component which was done with the ACL saw and chisels and after removing it, we set up for a TC3 femur.  The patient had a large left LCS femoral component.  We sized for a 4 left TC3 component.  I placed an IM rod in the femur,  set the cutting guide at zero and removed a very thin layer of bone and cement from the end of the femur.  The bone quality overall was quite good.  We then applied the anterior-posterior cutting guide, pinned it along the epicondylar axis and in a similar fashion performed a very mild cuts anteriorly and posteriorly and again the bone quality was good.  The chamfer cutting guide was then applied with the intramedullary rod in place and the chamfer cuts accomplished without difficulty followed by the removal of the rod and the box cut.  A 4 left TC3 femoral component trial was then placed on the femur and trial performed with a 12.5 bearing and excellent stability was noted.  At this point, all bony surfaces water picked clean dried with suction and sponges.  A double batch of DePuy HP cement was mixed at the back table, applied to all bony metallic mating surfaces except for the posterior condyles of the femur itself.  In order we hammered into place, the #4 PC3 femoral component and removed excess cement.  We inserted a 12.5-mm TC3 M.B.T. bearing and reduced the knee.  The knee was held in compression as the cement cured.  Excess cement was removed when necessary during the curing process.  We then checked our patellar tracking which remained excellent.  Medium Hemovac drains were placed from anterolateral approach.  The parapatellar arthrotomy was closed with running #1 Vicryl suture.  The subcutaneous tissue with 0-0 and 2-0 undyed Vicryl suture and the skin with skin staples.  A dressing of Xeroform, 4x4 dressing, sponges, Webril and Ace wrap applied.  The patient was placed in a knee immobilizer to protect the skin. Tourniquet let down.  The patient awakened, extubated, and taken to the recovery room without difficulty.     Feliberto Gottron. Turner Daniels, M.D.     [name]  D:  09/09/2010  T:  09/10/2010  Job:  [id number]  Electronically Signed by [name] M.D. on 09/11/2010 07:49:32 AM

## 2010-09-12 LAB — GLUCOSE, CAPILLARY: Glucose-Capillary: 143 mg/dL — ABNORMAL HIGH (ref 70–99)

## 2010-09-12 LAB — CBC
HCT: 33.1 % — ABNORMAL LOW (ref 39.0–52.0)
MCH: 28.3 pg (ref 26.0–34.0)
MCHC: 32.6 g/dL (ref 30.0–36.0)
MCV: 86.9 fL (ref 78.0–100.0)
RDW: 14.5 % (ref 11.5–15.5)

## 2010-09-13 LAB — BODY FLUID CULTURE: Culture: NO GROWTH

## 2010-09-15 LAB — ANAEROBIC CULTURE

## 2010-09-16 ENCOUNTER — Other Ambulatory Visit: Payer: Self-pay

## 2010-09-16 ENCOUNTER — Encounter: Payer: Self-pay | Admitting: Vascular Surgery

## 2010-10-07 ENCOUNTER — Other Ambulatory Visit (INDEPENDENT_AMBULATORY_CARE_PROVIDER_SITE_OTHER): Payer: BC Managed Care – PPO

## 2010-10-07 ENCOUNTER — Encounter (INDEPENDENT_AMBULATORY_CARE_PROVIDER_SITE_OTHER): Payer: Medicare Other | Admitting: Vascular Surgery

## 2010-10-07 DIAGNOSIS — I6529 Occlusion and stenosis of unspecified carotid artery: Secondary | ICD-10-CM

## 2010-10-07 DIAGNOSIS — Z48812 Encounter for surgical aftercare following surgery on the circulatory system: Secondary | ICD-10-CM

## 2010-10-10 NOTE — Discharge Summary (Signed)
  [name], [name] NO.:  [id number]  MEDICAL RECORD NO.:  [id number]           PATIENT TYPE:  I  LOCATION:  5009                         FACILITY:  [facility]  PHYSICIAN:  Feliberto Gottron. Turner Daniels, M.D.   DATE OF BIRTH:  [birth year]  DATE OF ADMISSION:  09/09/2010 DATE OF DISCHARGE:  09/12/2010                              DISCHARGE SUMMARY   CHIEF COMPLAINT:  Recurrent dislocations of left total knee.  HISTORY OF PRESENT ILLNESS:  This is a 70 year old gentleman who underwent primary left total knee arthroplasty 1 month prior by Dr. [name].  He did very well for a month and then began developing recurrent straight anterior dislocation of the femur on the tibia.  By x- ray, it appeared that the bearing was spinning out.  He was reduced once in the office and then dislocated about an hour later at home.  Surgical revision of his total knee was discussed.  He understands the risks and benefits and would like to proceed.  PAST MEDICAL HISTORY:  Significant for sleep apnea, diabetes, high cholesterol, and hypothyroidism.  PREOPERATIVE LABORATORY DATA:  White blood cells 8.0, red blood cells 4.56, hemoglobin 13.6, hematocrit 40, platelets 227.  PT 13.7, INR 1.03, PTT 27.  Sodium 138, potassium 3.6, chloride 97, glucose 108, BUN 20, creatinine 1.04.  Urinalysis was within normal limits.  HOSPITAL COURSE:  [name] was admitted to [facility] on September 09, 2010, when he underwent revision of his left total knee.  He was revised to a TC3 femur with 12.5-mm bearing.  Two Hemovac drains were placed into the left knee.  A perioperative Foley catheter was also placed.  He was transferred to the floor on Lovenox and Coumadin for DVT prophylaxis.  On the first postoperative day, he was eating well and reporting good pain control.  His Foley catheter was removed after physical therapy.  On the second postoperative day, his surgical dressing was changed and his incision was found to  be benign.  By postoperative day 3, he was eating well and ambulating independently and reporting good pain control.  He was discharged home after meeting his physical therapy goals.  DISPOSITION:  The patient was discharged home on September 12, 2010.  He was weightbearing as tolerated and would return to the clinic in 10 days for x-rays and staple removal.  Discharge medicines were as per the HMR with the additions of Percocet and Coumadin.  He would be on Coumadin for a total of 2 weeks with a target INR of 1.5-2.0.  FINAL DIAGNOSIS:  Recurrent dislocations of a left total knee.     [name], PA   ______________________________ Feliberto Gottron. Turner Daniels, M.D.    JW/MEDQ  D:  10/06/2010  T:  10/07/2010  Job:  [id number]  Electronically Signed by [name] PA on 10/07/2010 01:19:20 PM Electronically Signed by [name] M.D. on 10/10/2010 02:15:21 PM

## 2010-10-10 NOTE — Assessment & Plan Note (Signed)
OFFICE VISIT  [name], [name] DOB:  1940/07/21                                       10/07/2010 CHART#:[id number]  This is a new patient as he has not been seen in greater than 3 years by the practice.  HISTORY OF PRESENT ILLNESS:  This is a 70 year old gentleman with history of left carotid endarterectomy done in 2007.  His last most recent duplex was completed in April of 2011, demonstrated a 20%-39% stenosis on the right side and no recurrence on the left.  He denies any previous history of any stroke or TIA symptomatology.  He denies any amaurosis fugax.  No blindness.  He denies any facial droop or hemiplegia.  He denies expressive or receptive aphasia.  His risk factors for carotid disease included hypertension, history of tobacco use and also has a history of hyperlipidemia.  He denies any diabetes. His risk factor management includes aspirin, Zocor and an antihypertensive regimen.  On his neurologic review of systems he notes occasional headaches but the rest of his neurologic review of systems is negative.  PAST MEDICAL HISTORY:  Includes hypertension, gastroesophageal reflux disease, Barrett's esophagitis, tobacco abuse, peptic ulcer disease, obstructive sleep apnea, benign prostatic hypertrophy and restless leg syndrome.  PAST SURGICAL HISTORY:  Status post a left carotid endarterectomy done in July of 2007.  SOCIAL HISTORY:  He is an active smoker of about 1 cigar a day.  Denies any alcohol or illicit drug use.  FAMILY HISTORY:  Mother died at 37 of complication related to COPD.  The father has hypertension, diabetes and end-stage renal disease.  MEDICATIONS:  He is on Prevacid, Celebrex, Diovan, metoprolol, Synthroid, ferrous glu, Zocor, metformin, carbidopa, ropinirole, Travatan, epinastine, Vicodin, Maxalt, some type of steroid cream.  He is also on some aspirin, Fish Oil, calcium and vitamin D and Afrin nose spray as  needed.  ALLERGIES:  He has no known drug allergies listed.  REVIEW OF SYSTEMS:  Today he noted headaches, change in eyesight, pain in legs with walking, shortness of breath with exertion and reflux. Otherwise, the rest of his review of systems was negative.  PHYSICAL EXAMINATION:  Vital signs:  He had a blood pressure 154/82, left side 151/85.  Heart rate of 85, respirations were 12. General:  Well-developed, well-nourished, no apparent distress. Head:  Head normocephalic, atraumatic. ENT: Hearing grossly intact.  Nares without any erythema or drainage. Oropharynx without any erythema or exudate. Eyes: Pupils equal, round, reactive to light.  Extraocular movements were intact. Neck:  Supple neck.  No nuchal rigidity.  No palpable lymphadenopathy. Pulmonary:  Symmetric expansion, good air movement.  Clear to auscultation bilaterally.  No rales, rhonchi or wheezing. Cardiac:  Regular rate and rhythm.  Normal S1-S2.  No murmurs, rubs, thrills or gallops. Vascular:  Palpable upper extremity pulses, palpable carotids.  I did not appreciate his aorta due to his obesity.  There is no obvious bruit in the carotids.  Femoral pulses were palpable as were pedals but no popliteals. GI:  Soft abdomen, nontender, nondistended.  No guarding, no rebound, no hepatosplenomegaly, no obvious masses.  No costovertebral angle tenderness.  Mild obesity. Musculoskeletal:  5/5 strength throughout.  No signs of any ischemic changes in the extremity. Neuro:  Cranial nerves II-XII were intact.  Motor exam as listed above. Sensation grossly intact. Psychiatric:  Judgment was intact.  Mood  and affect were appropriate for his clinical situation. Skin:  There were no obvious rashes.  The findings were the extremities were as listed above. Lymphatics:  No cervical, axillary or inguinal lymphadenopathy.  NONINVASIVE VASCULAR IMAGING:  He had bilateral carotid duplexes completed which demonstrates on the left  side no evidence of restenosis, on the right side peak systolic velocity 97 c/s and diastolic of 21 c/s, this is consistent with 1%-39% stenosis.  MEDICAL DECISION MAKING:  This is a 70 year old gentleman status post a left carotid endarterectomy who presents for routine followup.  He has less than 80% stenosis bilaterally.  His left side has no evidence of restenosis and given that usually the restenosis would have been in the first 2 years it is unlikely he is going to need additional interventions on the left side.  He now meets criteria for annual surveillance and if he has any increase in velocities then we will have him follow up again for reevaluation.  Thank you for the opportunity to participate in this patient's care.    Cesar Hertz, MD Electronically Signed  BLC/MEDQ  D:  10/07/2010  T:  10/10/2010  Job:  [id number]  cc:   Barry Dienes. Eloise Harman, M.D.

## 2010-10-13 NOTE — Procedures (Unsigned)
CAROTID DUPLEX EXAM  INDICATION:  Carotid artery disease.  HISTORY: Diabetes:  yes Cardiac:  no Hypertension:  yes Smoking:  Quit in 2007 Previous Surgery:  Left carotid endarterectomy 12/2005 by Dr. Madilyn Fireman CV History:  Currently asymptomatic Amaurosis Fugax No, Paresthesias No, Hemiparesis No.  Other: Hyperlipidemia.                                      RIGHT             LEFT Brachial systolic pressure:         112               114 Brachial Doppler waveforms:         normal            normal Vertebral direction of flow:        Antegrade         Antegrade DUPLEX VELOCITIES (cm/sec) CCA peak systolic                   64                61 ECA peak systolic                   133               89 ICA peak systolic                   97                68 ICA end diastolic                   21                25 PLAQUE MORPHOLOGY:                  heterogenous PLAQUE AMOUNT:                      Minimal PLAQUE LOCATION:                    Bifurcation, ECA  IMPRESSION: 1. Right internal carotid artery velocities suggest 1% to 39%     stenosis. 2. Patent left carotid endarterectomy site with no evidence of     restenosis of  the internal carotid artery. 3. Antegrade vertebral arteries bilaterally.  ___________________________________________ Fransisco Hertz, MD  EM/MEDQ  D:  10/07/2010  T:  10/07/2010  Job:  [id number]

## 2010-11-01 NOTE — Procedures (Signed)
CAROTID DUPLEX EXAM   INDICATION:  Followup left carotid endarterectomy.   HISTORY:  Diabetes:  No.  Cardiac:  No.  Hypertension:  Yes.  Smoking:  Quit two years ago.  Previous Surgery:  Left carotid endarterectomy.  CV History:  Amaurosis Fugax , Paresthesias , Hemiparesis                                       RIGHT             LEFT  Brachial systolic pressure:         140               150  Brachial Doppler waveforms:         Biphasic          Biphasic  Vertebral direction of flow:        Antegrade         Antegrade  DUPLEX VELOCITIES (cm/sec)  CCA peak systolic                   98                110  ECA peak systolic                   128               84  ICA peak systolic                   79                73  ICA end diastolic                   29                27  PLAQUE MORPHOLOGY:                  Heterogenous      None  PLAQUE AMOUNT:                      Mild              None  PLAQUE LOCATION:                    ICA               None   IMPRESSION:  1. 1-39% stenosis noted in the right internal carotid artery.  2. Normal carotid duplex noted in the left internal carotid artery.  3. Status post left carotid endarterectomy.  4. Antegrade bilateral vertebral arteries.   ___________________________________________  P. Liliane Bade, M.D.   MG/MEDQ  D:  08/01/2007  T:  08/03/2007  Job:  [id number]

## 2010-11-01 NOTE — Op Note (Signed)
[name], [name]                  ACCOUNT NO.:  [id number]   MEDICAL RECORD NO.:  [id number]          PATIENT TYPE:  AMB   LOCATION:  DSC                          FACILITY:  [facility]   PHYSICIAN:  Lubertha Basque. Dalldorf, M.D.DATE OF BIRTH:  December 08, 1940   DATE OF PROCEDURE:  03/03/2008  DATE OF DISCHARGE:                               OPERATIVE REPORT   PREOPERATIVE DIAGNOSIS:  Right carpal tunnel syndrome.   POSTOPERATIVE DIAGNOSIS:  Right carpal tunnel syndrome.   PROCEDURE:  Right carpal tunnel release.   ANESTHESIA:  Bier block MAC.   ATTENDING SURGEON:  Lubertha Basque. [name], MD   ASSISTANT:  Lindwood Qua, PA   INDICATIONS FOR PROCEDURE:  The patient is a 70 year old man with a long  history of right hand difficulty.  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  He has seen neck specialist who does  not feel he needs operative intervention there.  With continued  difficulty at the hand, he is offered carpal tunnel release.  He has  failed conservative measures of bracing and oral antiinflammatories.  Informed operative consent was obtained after discussion of possible  complications of reaction to anesthesia, infection, and neurovascular  injury.   SUMMARY, FINDINGS, AND PROCEDURE:  Under Bier block and MAC through a  small palmar incision, a very thick transverse carpal ligament was  released.  He was closed primarily and splinted and discharged home.   DESCRIPTION OF PROCEDURE:  The patient was taken to the operating suite  where Bier block and MAC were applied.  He was positioned supine and  prepped and draped in normal sterile fashion.  After the administration  of IV Kefzol, a small incision was made ulnar to the thenar flexion  crease.  This did not cross the wrist flexion crease.  Dissection was  carried down through the palmar fascia to expose the transverse carpal  ligament.  This structure was released under direct visualization.  This  was very thick and did applied significant compression to the underlying  median nerve.  The dissection was taken distally to the transverse arch  of vessels and proximally through the distal fascia of the forearm.  The  wound was irrigated followed by reapproximation of skin alone with  nylon.  Tourniquet was deflated and fingers became pink and warm  immediately.  Adaptic was applied followed by dry gauze and a volar  splint of plaster with the wrist in slight extension.  Estimated blood  loss and intraoperative fluids can be obtained from anesthesia records  as can accurate tourniquet time.   DISPOSITION:  The patient was taken to recovery room in stable addition.  He is to go home same day and follow up in the office in next week.  I  will contact him by phone tonight.      Lubertha Basque [name], M.D.  Electronically Signed     PGD/MEDQ  D:  03/03/2008  T:  03/04/2008  Job:  [id number]

## 2010-11-01 NOTE — Procedures (Signed)
CAROTID DUPLEX EXAM   INDICATION:  Follow up carotid artery disease.   HISTORY:  Diabetes:  no  Cardiac:  no  Hypertension:  yes  Smoking:  Quit 2007  Previous Surgery:  Left carotid endarterectomy 12/2005 by Dr. Madilyn Fireman  CV History:  no  Amaurosis Fugax No, Paresthesias No, Hemiparesis No                                       RIGHT             LEFT  Brachial systolic pressure:         110               110  Brachial Doppler waveforms:         wnl               wnl  Vertebral direction of flow:        antegrade         antegrade  DUPLEX VELOCITIES (cm/sec)  CCA peak systolic                   100               97  ECA peak systolic                   216               105  ICA peak systolic                   95                69  ICA end diastolic                   31                33  PLAQUE MORPHOLOGY:                  heterogenous  PLAQUE AMOUNT:                      mild  PLAQUE LOCATION:                    ICA/ECA   IMPRESSION:  1. Right internal carotid artery suggests 20% to 39% stenosis.  2. Left internal carotid artery appears to have no restenosis, status      post left carotid endarterectomy.  3. Antegrade flow in bilateral vertebrals.   ___________________________________________  Janetta Hora Fields, MD   CB/MEDQ  D:  07/21/2009  T:  07/22/2009  Job:  [id number]

## 2010-11-01 NOTE — Procedures (Signed)
CAROTID DUPLEX EXAM   INDICATION:  Followup evaluation of carotid artery disease status post  carotid endarterectomy.   HISTORY:  Diabetes:  No.  Cardiac:  No.  Hypertension:  Yes.  Smoking:  Quit over 2 years ago.  Previous Surgery:  Left carotid endarterectomy in July of 2007 by Dr.  Madilyn Fireman.  CV History:  Previous duplex on 08/01/2007 revealed 1-39% right ICA  stenosis and no left ICA stenosis status post endarterectomy.  Amaurosis Fugax No, Paresthesias No, Hemiparesis No                                       RIGHT             LEFT  Brachial systolic pressure:         150               150  Brachial Doppler waveforms:         Triphasic         Triphasic  Vertebral direction of flow:        Antegrade         Antegrade  DUPLEX VELOCITIES (cm/sec)  CCA peak systolic                   72                87  ECA peak systolic                   122               114  ICA peak systolic                   78                82  ICA end diastolic                   25                36  PLAQUE MORPHOLOGY:                  Mixed             Soft  PLAQUE AMOUNT:                      Mild              Minimal  PLAQUE LOCATION:                    Proximal ICA      Distal CCA   IMPRESSION:  1. 20-39% right ICA stenosis.  2. 20-39% left ICA stenosis status post endarterectomy.  3. Proximal right common carotid artery is tortuous.   ___________________________________________  P. Liliane Bade, M.D.   MC/MEDQ  D:  07/23/2008  T:  07/23/2008  Job:  [id number]

## 2010-11-04 NOTE — Discharge Summary (Signed)
NAME:  [name], [name]                  ACCOUNT NO.:  [id number]   MEDICAL RECORD NO.:  [id number]          PATIENT TYPE:  INP   LOCATION:  3315                         FACILITY:  [facility]   PHYSICIAN:  Balinda Quails, M.D.    DATE OF BIRTH:  [birth year]   DATE OF ADMISSION:  01/03/2006  DATE OF DISCHARGE:  01/04/2006                                 DISCHARGE SUMMARY   PRIMARY ADMITTING DIAGNOSIS:  Severe left internal carotid artery stenosis.   ADDITIONAL/DISCHARGE DIAGNOSES:  1.  Severe left sternal carotid artery stenosis.  2.  Hypertension.  3.  Gastroesophageal reflux disease with Barrett's esophagus.  4.  Chronic tobacco abuse.  5.  Sleep apnea.  6.  Benign prostatic hypertrophy.  7.  Remote history of peptic ulcer disease.  8.  Restless leg syndrome.  9.  Thyroid nodule.   PROCEDURES PERFORMED:  Left carotid endarterectomy with Dacron patch  angioplasty.   HISTORY:  Patient is a 70 year old white male with a recently diagnosed  thyroid nodule.  During a work-up for this he was noted to have a left  carotid bruit.  He underwent carotid Doppler study as well as CT angiography  of the neck which verified a significant left internal carotid artery  stenosis.  He was referred to Dr. Liliane Bade for evaluation and it was felt  that he should undergo a left carotid endarterectomy in order to decrease  his risk of stroke.  Dr. Madilyn Fireman explained the risks, benefits, and  alternatives of the procedure to the patient and he agreed to proceed with  surgery.   HOSPITAL COURSE:  He was admitted to [facility] on January 03, 2006  and was taken to the operating room where he underwent a left carotid  endarterectomy with Dacron patch angioplasty.  He tolerated the procedure  well and was transferred to the floor in stable condition.  Postoperatively  he has done very well.  He has remained neurologically intact with the  exception of a slight left lip droop secondary to a nerve stretch  injury.  His incisions are healing well.  He initially required a low-dose dopamine  drip to help with hypotension.  However, he is now off all drips and his  blood pressure is stable.  He is afebrile and his other vital signs have  remained stable as well.  He is tolerating a regular diet.  His Foley  catheter has been removed.  His laboratories today show hemoglobin of 12.1,  hematocrit 36.3, white count 11.6, platelets 281.  Sodium 139, potassium 3.2  which has been supplemented, BUN 17, creatinine 1.2.  He will be ambulated  in the hall this morning with the assistance of the nursing staff and  provided he is voiding without problem and otherwise doing well after  ambulation, he will be ready for discharge home later this morning  postoperative day one.   DISCHARGE MEDICATIONS:  1.  Tylox one to two q.4h. p.r.n. for pain.  2.  Prevacid 30 mg b.i.d.  3.  Diovan/HCTZ 160/12.5 one daily.  4.  Chantix 1  mg daily.  5.  Celebrex 200 mg b.i.d.  6.  Maxalt 10 mg p.r.n.  7.  Flomax 0.4 mg q.h.s.  8.  Spiriva HandiHaler daily.  9.  Combivent two puffs q.i.d.  10. Aspirin 81 mg daily.  11. Tricor 145 mg daily.  12. Betamethasone cream 0.05% to affected areas as directed.   DISCHARGE INSTRUCTIONS:  He is asked to refrain from driving, heavy lifting,  or strenuous activity.  He may continue ambulating daily and using his  incentive spirometer.  He may shower daily and clean his incisions with soap  and water.   DISCHARGE FOLLOW-UP:  He will see Dr. Madilyn Fireman back in the office in three  weeks.  If he experiences any problems or has questions in the interim he is  asked to contact our office immediately.      Coral Ceo, P.A.      Balinda Quails, M.D.  Electronically Signed    GC/MEDQ  D:  01/04/2006  T:  01/04/2006  Job:  [id number]   cc:   Candyce Churn, M.D.  Fax: [phone]

## 2010-11-04 NOTE — Op Note (Signed)
NAME:  CUAHUTEMOC, ATTAR NO.:  [id number]   MEDICAL RECORD NO.:  [id number]          PATIENT TYPE:  INP   LOCATION:  3315                         FACILITY:  [facility]   PHYSICIAN:  Balinda Quails, M.D.    DATE OF BIRTH:  1941/03/13   DATE OF PROCEDURE:  01/03/2006  DATE OF DISCHARGE:                                 OPERATIVE REPORT   SURGEON:  Denman George, MD.   ASSISTANT:  Zadie Rhine, PA.   ANESTHETIC:  General endotracheal.   ANESTHESIOLOGIST:  Bedelia Person, MD.   PREOPERATIVE DIAGNOSIS:  Severe left internal carotid artery stenosis.   POSTOPERATIVE DIAGNOSIS:  Severe left internal carotid artery stenosis.   PROCEDURE:  Left carotid endarterectomy and Dacron patch angioplasty.   CLINICAL NOTE:  Mr. Brave Dack is a 70 year old male with a recently  diagnosed thyroid nodule.  During workup for this, he was noted to have left  carotid bruit.  He underwent carotid Doppler evaluation and CT angiography  of the neck.  This verified a significant left internal carotid artery  stenosis.  He was seen in consultation and it was recommended that he  undergo left carotid endarterectomy for reduction of stroke risk.  He  consented for surgery.  The risks of the operative procedure were explained  to the patient in detail with a major morbidity and mortality of 1-2% to  include but not limited to MI, CVA, cranial nerve injury and death.   OPERATIVE PROCEDURE:  The patient brought to the operating room in stable  hemodynamic condition.  Placed in supine position.  General endotracheal  anesthesia induced.  Foley catheter and arterial line in place.  The left  neck was prepped and draped in a sterile fashion.   A curvilinear skin incision made along the anterior border of the left  sternomastoid muscle.  Dissection carried through the subcutaneous tissue  with electrocautery.  Deep dissection carried down through the platysma.  Further dissection carried along the  anterior border of the sternomastoid  muscle to the carotid vessels.  The facial vein ligated with 2-0 silk and  divided.  The left common carotid artery was mobilized down to the omohyoid  muscle and encircled with a vessel loop.  The origin of the superior thyroid  and external carotid were freed and encircled with vessels.  The internal  carotid artery followed distally up to the posterior belly of the digastric  muscle and encircled with a Vesseloop.  The vagus nerve and hypoglossal  nerve were both clearly identified and preserved.   Evaluation of the carotid bifurcation verified plaque disease extending into  the origin of the left internal carotid artery for approximately 2-3 cm.  The patient administered 7000 units of heparin intravenously.  Adequate  circulation time permitted.  The carotid vessels controlled with clamps.  A  longitudinal arteriotomy made in the distal common carotid artery.  This was  extended across the carotid bulb and up into the internal carotid artery.  There was a high-grade stenosis in the left internal carotid artery  estimated be greater than  80%.  Calcified plaque present, minimal ulceration  evident.   A shunt was inserted.  The plaque then removed with an endarterectomy  elevator.  Plaque raised up into the common carotid artery where it was  divided transversely with Potts scissors.  The endarterectomy carried up  into the bulb where the superior thyroid and external carotid were  endarterectomized using an eversion technique.  The distal internal carotid  artery plaque then removed and feathered out well.   A patch angioplasty endarterectomy site was then carried out using a Finesse  Dacron patch with running 6-0 Prolene suture.  At the completion of the  patch angioplasty, the shunt was removed.  All vessels well flushed.  The  clamps were removed directing initial antegrade flow up the external carotid  artery, following this the internal  carotid was released.   Adequate hemostasis was obtained.  Sponges and instrument counts correct.  Excellent pulse and Doppler signal in the distal internal carotid artery.   The sternomastoid fascia closed with running 2-0 Vicryl suture.  Platysma  closed with running 3-0 Vicryl suture.  Skin closed with 4-0 Monocryl.  Steri-Strips applied.  The patient tolerated the procedure well.  No  apparent complications.  Transferred to recovery room in stable condition.      Balinda Quails, M.D.  Electronically Signed     PGH/MEDQ  D:  01/03/2006  T:  01/04/2006  Job:  [id number]   cc:   Candyce Churn, M.D.  Fax: [phone]

## 2010-11-04 NOTE — Op Note (Signed)
NAME:  [name], MACIVER NO.:  [id number]   MEDICAL RECORD NO.:  [id number]          PATIENT TYPE:  AMB   LOCATION:  ENDO                         FACILITY:  [facility]   PHYSICIAN:  Bernette Redbird, M.D.   DATE OF BIRTH:  12-Feb-1941   DATE OF PROCEDURE:  08/03/2006  DATE OF DISCHARGE:                               OPERATIVE REPORT   PROCEDURE:  Upper endoscopy with biopsy.   INDICATION:  70 year old gentleman for follow up of Barrett's esophagus.  Last endoscopy about three years ago showed no evidence of dysplasia.   FINDINGS:  Short segment Barrett's esophagus.   PROCEDURE:  The nature, purpose, and risks of the procedure were  familiar to the patient from prior examination and he provided written  consent.  Sedation was fentanyl 50 mcg and Versed 4 mg IV without  arrhythmias or desaturation.  The Pentax video endoscope was passed  under direct vision without significant difficulty.   The distal esophagus had some tongues of Barrett's mucosa extending  perhaps 2 cm above the lower esophageal sphincter.  The remainder of the  esophagus was normal.  There was no active inflammation and no evidence  of mass or tumor.  Multiple biopsies were obtained from this small area  of Barrett's mucosa.   The stomach was normal including retroflexed view of the cardia.  No  gastritis, erosions, ulcers, polyps or masses were seen and the pylorus,  duodenal bulb and second duodenum looked normal.   The patient tolerated the procedure well and there no apparent  complications.   IMPRESSION:  Short segment Barrett's esophagus, biopsied.   PLAN:  Await pathology results and continue PPI therapy.           ______________________________  Bernette Redbird, M.D.     RB/MEDQ  D:  08/03/2006  T:  08/03/2006  Job:  [id number]   cc:   Candyce Churn, M.D.

## 2010-11-04 NOTE — Op Note (Signed)
NAME:  EDNA, GROVER NO.:  [id number]   MEDICAL RECORD NO.:  [id number]          PATIENT TYPE:  OIB   LOCATION:  5731                         FACILITY:  [facility]   PHYSICIAN:  Angelia Mould. Derrell Lolling, M.D.DATE OF BIRTH:  August 20, 1940   DATE OF PROCEDURE:  03/14/2006  DATE OF DISCHARGE:  03/15/2006                                 OPERATIVE REPORT   PREOPERATIVE DIAGNOSIS:  Solid nodule right thyroid lobe.   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   OPERATION PERFORMED:  Total thyroidectomy with frozen sections.   SURGEON:  Angelia Mould. Derrell Lolling, M.D.   FIRST ASSISTANT:  Dr. Lebron Conners   OPERATIVE INDICATIONS:  This is a 70 year old white male who was being  evaluated for cerebrovascular disease earlier this year and was found to  have a significant left carotid stenosis.  He had CT scans and ultrasounds  and ultimately ultrasound of thyroid gland which showed a 3.7 cm solid  nodule in the midportion of the right lobe.  There was some 3 mm lesions on  the left.  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  Pathologist was concerned about this.  The patient underwent his left  carotid endarterectomy by Dr. Madilyn Fireman and has done well from that.  He now is  brought to operating room electively for his thyroid surgery.   OPERATIVE TECHNIQUE:  Following induction of general endotracheal anesthesia  the patient was placed a slightly reverse Trendelenburg position with the  neck extended.  The neck was prepped and draped in a sterile fashion.  A  transverse incision was made slightly curved in the skin crease about 2 cm  above the suprasternal notch.  Dissection was carried down through the  subcutaneous tissue in the platysma muscle.  Skin and platysma flaps were  raised superiorly and inferiorly and self-retaining retractor was placed.  Strap muscles were divided the  midline with electrocautery and then slowly  dissected off of the thyroid lobe on the right.   The patient bled a little bit more than usual because he takes aspirin.  The  nodule the right side was white and firm and there was a lot of  neovascularity and lots of extra blood vessels that had to be controlled  with metal clips.  We divided some of the lower pole of venous channels  between metal clips and mobilized the lower pole.  We then isolated the  upper pole vessels and ligated the superior thyroid artery in continuity  with 2-0 silk ties and placed a medium metal clip as well on the cephalad  end for security and divided the superior pole vessels.  This actually took  two divisions to get the superior pole mobilized down.  We then mobilized  the entire thyroid lobe on the right from lateral to medial.  We identified  the recurrent laryngeal nerve and felt that we preserved it.  I did not see  the superior parathyroid gland but the inferior parathyroid gland was seen  and felt to be preserved.  We very carefully dissected the thyroid gland off  of the trachea with the recurrent laryngeal nerve in view and mobilized this  over to the left of the midline.  We clamped the isthmus, divided the gland,  and sent it to the lab.  The isthmus was suture ligated with 3-0 Vicryl  suture ligature.   Dr. Laureen Ochs performed frozen section and called back stating that he was very  suspicious that this was a malignancy.  Because of the patient's history of  thyroid cancer in his father and this suspicious histology, I felt that it  would be in the patient's best interest to do a completion thyroidectomy.   I exposed left lobe of the thyroid.  There really was no visual or palpable  abnormalities of the left.  The superior pole vessels were isolated, ligated  in continuity with 2-0 silk ties and as well as a metal clip up above and  divided.  We mobilized all the venous channels to the lower pole of  the  midline structures, divided these between metal clips.  We then carefully  mobilized the left thyroid lobe off of the esophagus and trachea from  lateral to medial.  Small vascular channels, middle thyroid vein, and  inferior thyroid artery were identified, secured metal clips and divided.  We identified the recurrent laryngeal nerve on the left as well and felt  that we preserved that.  We also felt that we preserved the inferior  parathyroid gland on the left.  A left thyroid lobe was then mobilized up  off the trachea and removed and sent for routine histology.   The thyroid beds on both sides were irrigated.  We had a few small venous  bleeders which were controlled metal clips.  We placed Surgicel gauze in the  bed of thyroid on both sides and waited for about 10 minutes.  At this point  there was absolutely no bleeding and no blood in the wounds.  We closed the  strap muscles with interrupted sutures of 3-0 Vicryl.  The platysma muscle  was closed with 3-0 Vicryl sutures and skin closed with Steri-Strips and few  skin staples.  Clean bandages were placed and the patient taken recovery  room in stable condition.  Estimated blood loss was about 40-50 mL.  Complications none. Sponge, needle, and instrument counts were correct.      Angelia Mould. Derrell Lolling, M.D.  Electronically Signed     HMI/MEDQ  D:  03/14/2006  T:  03/15/2006  Job:  [id number]   cc:   Candyce Churn, M.D.  Balinda Quails, M.D.

## 2011-03-22 LAB — BASIC METABOLIC PANEL
CO2: 30
Chloride: 100
GFR calc Af Amer: >60
Sodium: 138

## 2011-10-13 ENCOUNTER — Ambulatory Visit (INDEPENDENT_AMBULATORY_CARE_PROVIDER_SITE_OTHER): Payer: Medicare Other | Admitting: Vascular Surgery

## 2011-10-13 DIAGNOSIS — Z48812 Encounter for surgical aftercare following surgery on the circulatory system: Secondary | ICD-10-CM

## 2011-10-13 DIAGNOSIS — I6529 Occlusion and stenosis of unspecified carotid artery: Secondary | ICD-10-CM

## 2011-10-18 ENCOUNTER — Other Ambulatory Visit: Payer: Self-pay | Admitting: *Deleted

## 2011-10-18 DIAGNOSIS — Z48812 Encounter for surgical aftercare following surgery on the circulatory system: Secondary | ICD-10-CM

## 2011-10-18 DIAGNOSIS — I6529 Occlusion and stenosis of unspecified carotid artery: Secondary | ICD-10-CM

## 2011-10-18 NOTE — Procedures (Unsigned)
CAROTID DUPLEX EXAM  INDICATION:  Carotid stenosis.  HISTORY: Diabetes:  Yes Cardiac:  No Hypertension:  Yes Smoking:  Previous Previous Surgery:  Left carotid endarterectomy on 01/03/2006 CV History:  Currently asymptomatic Amaurosis Fugax No, Paresthesias No, Hemiparesis No                                      RIGHT             LEFT Brachial systolic pressure:         154               172 Brachial Doppler waveforms:         WNL               WNL Vertebral direction of flow:        Antegrade         Antegrade DUPLEX VELOCITIES (cm/sec) CCA peak systolic                   96                109 ECA peak systolic                   168               111 ICA peak systolic                   132               69 ICA end diastolic                   41                24 PLAQUE MORPHOLOGY:                  Heterogeneous     Heterogeneous PLAQUE AMOUNT:                      Moderate          Mild PLAQUE LOCATION:                    CCA/ICA/ECA       CCA/ICA/ECA  IMPRESSION: 1. Right internal carotid artery stenosis present in the 40%-59%     range. 2. Right external carotid artery stenosis present. 3. Left external carotid artery is patent with non hemodynamically     significant plaque present. 4. Left internal carotid artery is patent with history of     endarterectomy, mild heterogeneous plaque present suggesting     restenosis in the 1%-39% range. 5. Patent and antegrade bilateral vertebral arteries present. 6. Increase in the disease process bilaterally since previous study on     10/07/2010.  ___________________________________________ Fransisco Hertz, MD  SH/MEDQ  D:  10/13/2011  T:  10/13/2011  Job:  [id number]

## 2011-10-19 ENCOUNTER — Encounter: Payer: Self-pay | Admitting: Vascular Surgery

## 2012-10-11 ENCOUNTER — Ambulatory Visit: Payer: Medicare Other | Admitting: Neurosurgery

## 2012-10-11 ENCOUNTER — Other Ambulatory Visit: Payer: Medicare Other

## 2012-10-25 ENCOUNTER — Other Ambulatory Visit: Payer: Medicare Other

## 2012-10-25 ENCOUNTER — Ambulatory Visit: Payer: Medicare Other | Admitting: Vascular Surgery

## 2012-11-01 ENCOUNTER — Encounter: Payer: Self-pay | Admitting: Vascular Surgery

## 2012-11-28 ENCOUNTER — Encounter: Payer: Self-pay | Admitting: Vascular Surgery

## 2012-11-29 ENCOUNTER — Ambulatory Visit (INDEPENDENT_AMBULATORY_CARE_PROVIDER_SITE_OTHER): Payer: Medicare Other | Admitting: Vascular Surgery

## 2012-11-29 ENCOUNTER — Encounter: Payer: Self-pay | Admitting: Vascular Surgery

## 2012-11-29 ENCOUNTER — Other Ambulatory Visit (INDEPENDENT_AMBULATORY_CARE_PROVIDER_SITE_OTHER): Payer: Medicare Other | Admitting: *Deleted

## 2012-11-29 DIAGNOSIS — Z85850 Personal history of malignant neoplasm of thyroid: Secondary | ICD-10-CM | POA: Insufficient documentation

## 2012-11-29 DIAGNOSIS — Z96652 Presence of left artificial knee joint: Secondary | ICD-10-CM

## 2012-11-29 DIAGNOSIS — I6529 Occlusion and stenosis of unspecified carotid artery: Secondary | ICD-10-CM

## 2012-11-29 DIAGNOSIS — Z48812 Encounter for surgical aftercare following surgery on the circulatory system: Secondary | ICD-10-CM

## 2012-11-29 DIAGNOSIS — Z96659 Presence of unspecified artificial knee joint: Secondary | ICD-10-CM

## 2012-11-29 NOTE — Progress Notes (Signed)
Established Carotid Patient   Date of Surgery: Left CEA 01/03/2006 Surgeon: Reece Agar. Madilyn Fireman, MD  History of Present Illness  [name] is a 72 y.o. male who has known carotid stenosis. Pt states he continues to do well.  He had thyroidectomy for thyroid cancer in 2008 and a left TKR x2  In 2012. He denies any claudication and is able to perform all of his daily activities. Patient has had previous Left CEA. Patient has Negative history of TIA or stroke symptom.  The patient denies amaurosis fugax or monocular blindness.  The patient  denies facial drooping.  Pt. denies hemiplegia.  The patient denies receptive or expressive aphasia.  Pt. denies weakness; upper extremities and lower extremities;  Non-Invasive Vascular Imaging CAROTID DUPLEX 11/29/2012   Right ICA 20 - 39 % stenosis Left ICA 20 - 39 % stenosis  Previous carotid studies demonstrated: RICA 20 - 39 % stenosis, LICA 20 - 39 % stenosis.  These findings are Unchanged from previous exam  Past Medical History  Diagnosis Date  . Hypertension   . GERD (gastroesophageal reflux disease)   . Barrett esophagus   . Chronic kidney disease   . OSA (obstructive sleep apnea)   . Restless leg   . CAD (coronary artery disease)   . Diabetes mellitus without complication   . Ulcer     Peptic ulcer disease  . COPD (chronic obstructive pulmonary disease)   . Carotid artery occlusion   . Cancer     thyroid  . Thyroid disease   . History of thyroid cancer 2008  . History of thyroid cancer    Past Surgical History  Procedure Laterality Date  . Carotid endarterectomy Left January 03, 2006    Dr. Madilyn Fireman  . Joint replacement Left     knee X 2  . Thyroidectomy  2008   Social History History  Substance Use Topics  . Smoking status: Former Smoker    Quit date: 06/19/2005  . Smokeless tobacco: Never Used  . Alcohol Use: No    Family History Family History  Problem Relation Age of Onset  . COPD Mother   . Hyperlipidemia Mother   .  Hypertension Mother   . Diabetes Father   . Kidney disease Father     ESRD  . Hypertension Father   . Cancer Father   . Hyperlipidemia Father     Review of Systems : [x]  Positive   [ ]  Denies  General:[ ]  Weight loss,  [ ]  Weight gain, [ ]  Loss of appetite, [ ]  Fever, [ ]  chills  Neurologic: [ ]  Dizziness, [ ]  Blackouts, [ ]  Headaches, [ ]  Seizure [ ]  Stroke, [ ]  "Mini stroke", [ ]  Slurred speech, [ ]  Temporary blindness;  [ ] weakness,  Ear/Nose/Throat: [ ]  Change in hearing, [ ]  Nose bleeds, [ ]  Hoarseness  Vascular:[ ]  Pain in legs with walking, [ ]  Pain in feet while lying flat , [ ]   Non-healing ulcer, [ ]  Blood clot in vein,    Pulmonary: [ ]  Home oxygen, [ ]   Productive cough, [ ]  Bronchitis, [ ]  Coughing up blood,  [ ]  Asthma, [ ]  Wheezing  Musculoskeletal:  [x ] Arthritis, [ x] Joint pain, [ ]  low back pain  Cardiac: [ ]  Chest pain, [ ]  Shortness of breath when lying flat, [ ]  Shortness of breath with exertion, [ ]  Palpitations, [ ]  Heart murmur, [ ]   Atrial fibrillation  Hematologic:[ ]  Easy Bruising, [ ]   Anemia; [ ]  Hepatitis  Psychiatric: [ ]   Depression, [ ]  Anxiety   Gastrointestinal: [ ]  Black stool, [ ]  Blood in stool, [ ]  Peptic ulcer disease,  [ ]  Gastroesophageal Reflux, [ ]  Trouble swallowing, [ ]  Diarrhea, [ ]  Constipation  Urinary: [ ]  chronic Kidney disease, [ ]  on HD, [ ]  Burning with urination, [ ]  Frequent urination, [ ]  Difficulty urinating;   Skin: [ ]  Rashes, [ ]  Wounds   No Known Allergies  Current Outpatient Prescriptions  Medication Sig Dispense Refill  . aspirin 325 MG tablet Take 325 mg by mouth daily.      . betamethasone dipropionate (DIPROLENE) 0.05 % cream Apply topically 2 (two) times daily.      . calcium citrate-vitamin D (CITRACAL+D) 315-200 MG-UNIT per tablet Take 1 tablet by mouth 2 (two) times daily. 750 mg      . carbidopa-levodopa (PARCOPA) 10-100 MG per disintegrating tablet Take 1 tablet by mouth 3 (three) times daily.      .  Cholecalciferol (VITAMIN D3) 2000 UNITS TABS Take 1 tablet by mouth daily.      [name] Epinastine HCl 0.05 % ophthalmic solution 1 drop 2 (two) times daily.      . ferrous gluconate (FERGON) 324 MG tablet Take 324 mg by mouth daily with breakfast.      . fish oil-omega-3 fatty acids 1000 MG capsule Take 2 g by mouth daily.      [name] levothyroxine (SYNTHROID, LEVOTHROID) 200 MCG tablet Take 175 mcg by mouth 2 (two) times daily.       [name] losartan-hydrochlorothiazide (HYZAAR) 100-25 MG per tablet Take 1 tablet by mouth daily.      . metformin (FORTAMET) 500 MG (OSM) 24 hr tablet Take 500 mg by mouth daily with breakfast.      . metoprolol succinate (TOPROL-XL) 25 MG 24 hr tablet Take 25 mg by mouth daily.      . Multiple Vitamin (MULTIVITAMIN) tablet Take 1 tablet by mouth daily.      [name] oxymetazoline (AFRIN) 0.05 % nasal spray Place 2 sprays into the nose 2 (two) times daily.      . ranitidine (ZANTAC) 150 MG tablet Take 150 mg by mouth 2 (two) times daily.      . rizatriptan (MAXALT) 10 MG tablet Take 10 mg by mouth as needed for migraine. May repeat in 2 hours if needed      . rOPINIRole (REQUIP) 1 MG tablet Take 1 mg by mouth 4 (four) times daily.       . simvastatin (ZOCOR) 20 MG tablet Take 20 mg by mouth every evening.      . travoprost, benzalkonium, (TRAVATAN) 0.004 % ophthalmic solution 1 drop at bedtime.      . celecoxib (CELEBREX) 200 MG capsule Take 200 mg by mouth 2 (two) times daily.      . cholecalciferol (VITAMIN D) 1000 UNITS tablet Take 2,000 Units by mouth daily.      . lansoprazole (PREVACID) 30 MG capsule Take 30 mg by mouth daily.       No current facility-administered medications for this visit.    Physical Examination  Filed Vitals:   11/29/12 1409  BP: 158/117  Pulse:     General: WDWN male in NAD  Head: Icard/AT  ENT: Hearing intact, oropharynx without erythema or exudate, nares without drainage  GAIT: normal  Eyes: PERRLA, EOMI  Pulmonary:  CTAB, Negative  Rales,  Negative rhonchi, & Negative wheezing,  GI: soft,  NTND, -G/R  Cardiac: regular Rhythm , no M/R/T/G  Vascular: palpable carotid, femoral and brachial pulses  Musculoskeletal: good and equal strength in BUE/BLE  Neurologic: A&O X 3; Appropriate Affect ; SENSATION ;normal; MOTOR FUNCTION: normal 5/5 strength in all tested muscle groups and no muscle wasting or atrophy Speech is normal  Psych: judgment intact, mood and affect appropriate for clinical situation  Skin: no obvious rashes noted, ext as listed in M/S Non-Invasive Vascular Imaging  CAROTID DUPLEX (Date: 11/29/12):   R ICA stenosis: <40%  R VA: patent and antegrade  L ICA stenosis: <40%, patent L CEA  L VA: patent and antegrade  Carotid duplex was interpreted by Dr. Salvadore Farber  Assessment: [name] is a 72 y.o. male who presents S/P Left CEA in 2007 by Dr. Madilyn Fireman with:Asymptomatic 20 - 39 % Bilateral ICA  stenosis The  ICA stenosis is  Unchanged from previous exam.  Plan: Follow-up in 2 years with Carotid Duplex scan  Pt was given information regarding stroke symptoms and prevention  Clinic MD: [facility]  Addendum  I have independently interviewed and examined the patient, and I agree with the physician assistant's findings.  Neurologic exam is completely normal.  This patient has minimal B carotid disease for several years.  At this point, I think spacing out the carotid duplex to 2 years is indicated.  I ordered such  Leonides Sake, MD [facility] Office: [phone] Pager: [phone]  11/29/2012, 5:04 PM

## 2013-02-18 ENCOUNTER — Ambulatory Visit (INDEPENDENT_AMBULATORY_CARE_PROVIDER_SITE_OTHER): Payer: Medicare Other | Admitting: Neurology

## 2013-02-18 ENCOUNTER — Encounter: Payer: Self-pay | Admitting: Neurology

## 2013-02-18 VITALS — BP 145/81 | HR 80 | Resp 18 | Ht 67.0 in | Wt 232.0 lb

## 2013-02-18 DIAGNOSIS — G4731 Primary central sleep apnea: Secondary | ICD-10-CM

## 2013-02-18 DIAGNOSIS — G4730 Sleep apnea, unspecified: Secondary | ICD-10-CM

## 2013-02-18 DIAGNOSIS — E669 Obesity, unspecified: Secondary | ICD-10-CM | POA: Insufficient documentation

## 2013-02-18 DIAGNOSIS — J989 Respiratory disorder, unspecified: Secondary | ICD-10-CM

## 2013-02-18 DIAGNOSIS — G4739 Other sleep apnea: Secondary | ICD-10-CM

## 2013-02-18 NOTE — Progress Notes (Signed)
[facility]  Provider:  Melvyn Novas, M D  Referring Provider: Jarome Matin, MD Primary Care Physician:  Garlan Fillers, MD  Chief Complaint  Patient presents with  . CPap - Follow UP    # 11 -  Revisit    HPI:  [name] is a 72 y.o. male  Is seen here as a referral/ revisit  from Dr. Eloise Harman for CPAP compliance.  To March underwent a split study in 2011 at [facility]. His AHI was 77 per hour he was titrated to 16 cm water pressure with a 3 cm EPR The residual AHI became lower than 5. A download reorder from August 2013 showed 100% compliance. I was unable today to obtain the data for 2014 but will ask Mr. [name] 'es DME - it is [facility] here in [place]/ . to because the data as we are unable to obtain/  Interrogate the machine  here in office.  He has been in the past 100% compliant- there  is no reason for me to assume that he has stopped using CPAP. He has increasingly problems with GOUT and had an attack 3 days ago. 3 weeks ago he developed a severe pain in shoulder and elbow on the right , His feet started hurting bad, uric acid was high .When Dr Demetrius Charity. initiated  Colcrys,  he got immediately better.  He reports getting short of breath easily, he has not been able to lose weight. Dr. Jarold [name] initiated Marcelline Deist.        Review of Systems: Out of a complete 14 system review, the patient complains of only the following symptoms, and all other reviewed systems are negative.  SOB,   History   Social History  . Marital Status: Married    Spouse Name: N/A    Number of Children: 2  . Years of Education: COLLEGE   Occupational History  .     Social History Main Topics  . Smoking status: Former Smoker    Quit date: 06/19/2005  . Smokeless tobacco: Never Used  . Alcohol Use: No  . Drug Use: No  . Sexual Activity: Not on file   Other Topics Concern  . Not on file   Social History Narrative  . No narrative on file    Family History  Problem  Relation Age of Onset  . COPD Mother   . Hyperlipidemia Mother   . Hypertension Mother   . Diabetes Father   . Kidney disease Father     ESRD  . Hypertension Father   . Cancer Father   . Hyperlipidemia Father     Past Medical History  Diagnosis Date  . Hypertension   . GERD (gastroesophageal reflux disease)   . Barrett esophagus   . Chronic kidney disease   . OSA (obstructive sleep apnea)   . Restless leg   . CAD (coronary artery disease)   . Diabetes mellitus without complication   . Ulcer     Peptic ulcer disease  . COPD (chronic obstructive pulmonary disease)   . Carotid artery occlusion   . Cancer     thyroid  . Thyroid disease   . History of thyroid cancer 2008  . History of thyroid cancer   . Sleep apnea with use of continuous positive airway pressure (CPAP)     2011 [facility] , AHI  77cn central and obstrcutive. 16 cm water , 3 cm EPR.     Past Surgical History  Procedure Laterality Date  .  Carotid endarterectomy Left January 03, 2006    Dr. Madilyn Fireman  . Joint replacement Left     knee X 2  . Thyroidectomy  2008    Current Outpatient Prescriptions  Medication Sig Dispense Refill  . aspirin 325 MG tablet Take 325 mg by mouth daily.      . betamethasone dipropionate (DIPROLENE) 0.05 % cream Apply topically 2 (two) times daily.      . calcium citrate-vitamin D (CITRACAL+D) 315-200 MG-UNIT per tablet Take 1 tablet by mouth 2 (two) times daily. 750 mg      . carbidopa-levodopa (PARCOPA) 10-100 MG per disintegrating tablet Take 1 tablet by mouth 3 (three) times daily.      . cholecalciferol (VITAMIN D) 1000 UNITS tablet Take 2,000 Units by mouth daily.      . Cholecalciferol (VITAMIN D3) 2000 UNITS TABS Take 1 tablet by mouth daily.      [name] COLCRYS 0.6 MG tablet Take 0.6 mg by mouth daily.      . fish oil-omega-3 fatty acids 1000 MG capsule Take 2 g by mouth daily.      [name] HYDROcodone-acetaminophen (NORCO/VICODIN) 5-325 MG per tablet       . levothyroxine  (SYNTHROID, LEVOTHROID) 200 MCG tablet Take 175 mcg by mouth 2 (two) times daily.       [name] losartan-hydrochlorothiazide (HYZAAR) 100-25 MG per tablet Take 1 tablet by mouth daily.      . metformin (FORTAMET) 500 MG (OSM) 24 hr tablet Take 500 mg by mouth daily with breakfast.      . metoprolol (LOPRESSOR) 50 MG tablet       . metoprolol succinate (TOPROL-XL) 25 MG 24 hr tablet Take 25 mg by mouth daily.      . Multiple Vitamin (MULTIVITAMIN) tablet Take 1 tablet by mouth daily.      [name] oxymetazoline (AFRIN) 0.05 % nasal spray Place 2 sprays into the nose 2 (two) times daily.      . ranitidine (ZANTAC) 150 MG tablet Take 150 mg by mouth 2 (two) times daily.      . rizatriptan (MAXALT) 10 MG tablet Take 10 mg by mouth as needed for migraine. May repeat in 2 hours if needed      . rOPINIRole (REQUIP) 1 MG tablet Take 1 mg by mouth 4 (four) times daily.       . simvastatin (ZOCOR) 20 MG tablet Take 20 mg by mouth every evening.       No current facility-administered medications for this visit.    Allergies as of 02/18/2013  . (No Known Allergies)    Vitals: BP 145/81  Pulse 80  Resp 18  Ht 5\' 7"  (1.702 m)  Wt 232 lb (105.235 kg)  BMI 36.33 kg/m2 Last Weight:  Wt Readings from Last 1 Encounters:  02/18/13 232 lb (105.235 kg)   Last Height:   Ht Readings from Last 1 Encounters:  02/18/13 5\' 7"  (1.702 m)    Physical exam:  General: The patient is awake, alert and appears not in acute distress. The patient is well groomed. Head: Normocephalic, atraumatic. Neck is supple. Mallampati 1 wide open-   neck circumference: 18 , no nasal deviation.  No TMJ . Tonsillectomy at age 42 .  Cardiovascular:  Regular rate and rhythm , without  murmurs or carotid bruit, and without distended neck veins. Respiratory: Lungs are clear to auscultation. Skin:  Without evidence of edema, or rash Trunk: BMI is elevated and patient  has normal posture.  Neurologic exam : The patient is awake and alert,  oriented to place and time.   Memory subjective  described as intact. There is a normal attention span & concentration ability. Speech is fluent without  dysarthria, dysphonia or aphasia. Mood and affect are appropriate.  Cranial nerves: Pupils are equal and briskly reactive to light. Funduscopic exam without  evidence of pallor or edema. Extraocular movements  in vertical and horizontal planes intact and without nystagmus. Visual fields by finger perimetry are intact. Hearing to finger rub intact.  Facial sensation intact to fine touch. Facial motor strength is symmetric and tongue and uvula move midline.  Motor exam:   Normal tone and normal muscle bulk and symmetric normal strength in all extremities. He had recently undergone left knee surgery , full replacement repeat.   Sensory:  Fine touch, pinprick and vibration were tested in all extremities. Proprioception is tested in the upper extremities only. This was  normal.  Coordination: Rapid alternating movements in the fingers/hands is tested and normal. Finger-to-nose maneuver tested and normal without evidence of ataxia, dysmetria or tremor.  Gait and station: Patient walks without assistive device and is able and assisted stool climb up to the exam table. Strength within normal limits. Stance is stable and normal. Steps are unfragmented. Romberg testing is normal.  Deep tendon reflexes: in the  upper and lower extremities are symmetric and intact. Babinski maneuver response is  downgoing.   Assessment:   OSA, complex apnea on 16 cm water. No recent download obtained . Continue use until download from [facility] is available.  BMI morbidly obese, SOB , wheezing.   Plan:   No change in settings. Review Download first.  BMI as main risk factor. Patient is diabetic.  Exercise has been difficult to due to SOB and  joint pain. Discussed lifestyle changes for gout, obesity and arthritis. 50% of visit time.  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  He gained weight in retirement.

## 2013-02-18 NOTE — Patient Instructions (Signed)
Calorie Counting Diet A calorie counting diet requires you to eat the number of calories that are right for you in a day. Calories are the measurement of how much energy you get from the food you eat. Eating the right amount of calories is important for staying at a healthy weight. If you eat too many calories, your body will store them as fat and you may gain weight. If you eat too few calories, you may lose weight. Counting the number of calories you eat during a day will help you know if you are eating the right amount. A Registered Dietitian can determine how many calories you need in a day. The amount of calories needed varies from person to person. If your goal is to lose weight, you will need to eat fewer calories. Losing weight can benefit you if you are overweight or have health problems such as heart disease, high blood pressure, or diabetes. If your goal is to gain weight, you will need to eat more calories. Gaining weight may be necessary if you have a certain health problem that causes your body to need more energy. TIPS Whether you are increasing or decreasing the number of calories you eat during a day, it may be hard to get used to changes in what you eat and drink. The following are tips to help you keep track of the number of calories you eat.  Measure foods at home with measuring cups. This helps you know the amount of food and number of calories you are eating.  Restaurants often serve food in amounts that are larger than 1 serving. While eating out, estimate how many servings of a food you are given. For example, a serving of cooked rice is  cup or about the size of half of a fist. Knowing serving sizes will help you be aware of how much food you are eating at restaurants.  Ask for smaller portion sizes or child-size portions at restaurants.  Plan to eat half of a meal at a restaurant. Take the rest home or share the other half with a friend.  Read the Nutrition Facts panel on  food labels for calorie content and serving size. You can find out how many servings are in a package, the size of a serving, and the number of calories each serving has.  For example, a package might contain 3 cookies. The Nutrition Facts panel on that package says that 1 serving is 1 cookie. Below that, it will say there are 3 servings in the container. The calories section of the Nutrition Facts label says there are 90 calories. This means there are 90 calories in 1 cookie (1 serving). If you eat 1 cookie you have eaten 90 calories. If you eat all 3 cookies, you have eaten 270 calories (3 servings x 90 calories = 270 calories). The list below tells you how big or small some common portion sizes are.  1 oz.........4 stacked dice.  3 oz.........Deck of cards.  1 tsp........Tip of little finger.  1 tbs........Thumb.  2 tbs........Golf ball.   cup.......Half of a fist.  1 cup........A fist. KEEP A FOOD LOG Write down every food item you eat, the amount you eat, and the number of calories in each food you eat during the day. At the end of the day, you can add up the total number of calories you have eaten. It may help to keep a list like the one below. Find out the calorie information by reading the   Nutrition Facts panel on food labels. Breakfast  Bran cereal (1 cup, 110 calories).  Fat-free milk ( cup, 45 calories). Snack  Apple (1 medium, 80 calories). Lunch  Spinach (1 cup, 20 calories).  Tomato ( medium, 20 calories).  Chicken breast strips (3 oz, 165 calories).  Shredded cheddar cheese ( cup, 110 calories).  Light [place] dressing (2 tbs, 60 calories).  Whole-wheat bread (1 slice, 80 calories).  Tub margarine (1 tsp, 35 calories).  Vegetable soup (1 cup, 160 calories). Dinner  Pork chop (3 oz, 190 calories).  Brown rice (1 cup, 215 calories).  Steamed broccoli ( cup, 20 calories).  Strawberries (1  cup, 65 calories).  Whipped cream (1 tbs, 50  calories). Daily Calorie Total: 1425 Document Released: 06/05/2005 Document Revised: 08/28/2011 Document Reviewed: 11/30/2006 [facility] Patient Information 2014 [place], [place]. Sleep Apnea Sleep apnea is disorder that affects a person's sleep. A person with sleep apnea has abnormal pauses in their breathing when they sleep. It is hard for them to get a good sleep. This makes a person tired during the day. It also can lead to other physical problems. There are three types of sleep apnea. One type is when breathing stops for a short time because your airway is blocked (obstructive sleep apnea). Another type is when the brain sometimes fails to give the normal signal to breathe to the muscles that control your breathing (central sleep apnea). The third type is a combination of the other two types. HOME CARE  Do not sleep on your back. Try to sleep on your side.  Take all medicine as told by your doctor.  Avoid alcohol, calming medicines (sedatives), and depressant drugs.  Try to lose weight if you are overweight. Talk to your doctor about a healthy weight goal. Your doctor may have you use a device that helps to open your airway. It can help you get the air that you need. It is called a positive airway pressure (PAP) device. There are three types of PAP devices:  Continuous positive airway pressure (CPAP) device.  Nasal expiratory positive airway pressure (EPAP) device.  Bilevel positive airway pressure (BPAP) device. MAKE SURE YOU:  Understand these instructions.  Will watch your condition.  Will get help right away if you are not doing well or get worse. Document Released: 03/14/2008 Document Revised: 05/22/2012 Document Reviewed: 10/07/2011 [facility] Patient Information 2014 [place], [place].

## 2013-02-21 ENCOUNTER — Encounter: Payer: Self-pay | Admitting: Neurology

## 2013-08-20 ENCOUNTER — Encounter: Payer: Self-pay | Admitting: Neurology

## 2013-08-20 ENCOUNTER — Ambulatory Visit (INDEPENDENT_AMBULATORY_CARE_PROVIDER_SITE_OTHER): Payer: Medicare Other | Admitting: Neurology

## 2013-08-20 ENCOUNTER — Encounter (INDEPENDENT_AMBULATORY_CARE_PROVIDER_SITE_OTHER): Payer: Self-pay

## 2013-08-20 VITALS — BP 138/88 | HR 67 | Resp 18 | Ht 67.25 in | Wt 238.0 lb

## 2013-08-20 DIAGNOSIS — Z9989 Dependence on other enabling machines and devices: Secondary | ICD-10-CM

## 2013-08-20 DIAGNOSIS — G4733 Obstructive sleep apnea (adult) (pediatric): Secondary | ICD-10-CM

## 2013-08-20 NOTE — Progress Notes (Signed)
[facility]  Provider:  Larey Seat, M D  Referring Provider: [name], MD Primary Care Physician:  Donnajean Lopes, MD  No chief complaint on file.   HPI:  [name] is a 73 y.o. male  Is seen here as a  revisit  from Dr. [name] for CPAP compliance every 12 month .    The patient  underwent a split study in 2011 at [facility]. His AHI was 77 per hour and he was titrated to 16 cm water pressure with a 3 cm EPR.  The residual AHI became lower than 5. A download reorder from August 2013 showed 100% compliance. I was unable today to obtain the data for 2014 but  asked Mr. Byron 'es DME - [facility] here in [place]) to provide [place] with the most recent data. He got this download per [name] today.  Today the download shows been 96% compliance over the last 90 days, average time of CPAP use per day at 6 hours and 52 minutes, residual AHI is 6.9 and the sac pressure is 16 cm water with 3 cm EPR there is a moderately high air leak, the pressure is so high his mask blows off his face. He needs a new mask and headgear soon.  I would like for him to have an auto-titrator machine with a setting from 8 through 16 cm water . He states he never sleeps without his CPAP and likes the machines effect on his sleep, rest, agility.   He has 2- 3 nocturia breaks interrupting his sleep. Bedtime is between 23 hours and midnight. Subjective getting 7-8 hours of sleep , wakes up spontaneously at 7 AM.  Breakfast with caffeine , 2-3 cups of coffee.       Last visit note; 02-18-13  He has been in the past 100% compliant-  He has increasingly problems with GOUT and had an attack 3 days ago.  3 weeks ago he developed a severe pain in shoulder and elbow on the right , His feet started hurting bad, uric acid was high. When [name] initiated Colcrys, he got immediately better. He reports getting short of breath easily, he has not been able to lose weight. Dr. [name] initiated diabetes   Medication to reduce glucosuria.        Review of Systems: Out of a complete 14 system review, the patient complains of only the following symptoms, and all other reviewed systems are negative.  SOB, fatigue reduced. GDS one points, Epworth 7 points, FSS 32 points, shoulder pain keeps him from sleeping.  He adjusted his diet , his wife no longer cooks fried foods, more whole wheat , salads. His son is a Child psychotherapist and supplies the couple with whole wheat .    History   Social History  . Marital Status: Married    Spouse Name: [name]    Number of Children: 2  . Years of Education: COLLEGE   Occupational History  .     Social History Main Topics  . Smoking status: Former Smoker    Quit date: 06/19/2005  . Smokeless tobacco: Never Used  . Alcohol Use: No  . Drug Use: No  . Sexual Activity: Not on file   Other Topics Concern  . Not on file   Social History Narrative   Patient is married [name]) and lives at home with his wife.   Patient has two children.   Patient is retired.   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   Patient is right-handed.   Patient drinks three cups of coffee per day.    Family History  Problem Relation Age of Onset  . COPD Mother   . Hyperlipidemia Mother   . Hypertension Mother   . Diabetes Father   . Kidney disease Father     ESRD  . Hypertension Father   . Cancer Father   . Hyperlipidemia Father     Past Medical History  Diagnosis Date  . Hypertension   . GERD (gastroesophageal reflux disease)   . Barrett esophagus   . Chronic kidney disease   . OSA (obstructive sleep apnea)   . Restless leg   . CAD (coronary artery disease)   . Diabetes mellitus without complication   . Ulcer     Peptic ulcer disease  . COPD (chronic obstructive pulmonary disease)   . Carotid artery occlusion   . Cancer     thyroid  . Thyroid disease   . History of thyroid cancer 2008  . History of thyroid cancer   . Sleep apnea with use  of continuous positive airway pressure (CPAP)     [facility] , AHI  77cn central and obstrcutive. 16 cm water , 3 cm EPR.     Past Surgical History  Procedure Laterality Date  . Carotid endarterectomy Left January 03, 2006    Dr. Amedeo Plenty  . Joint replacement Left     knee X 2  . Thyroidectomy  2008    Current Outpatient Prescriptions  Medication Sig Dispense Refill  . aspirin 325 MG tablet Take 325 mg by mouth daily.      . betamethasone dipropionate (DIPROLENE) 0.05 % cream Apply topically 2 (two) times daily.      . calcium citrate-vitamin D (CITRACAL+D) 315-200 MG-UNIT per tablet Take 1 tablet by mouth 2 (two) times daily. 750 mg      . carbidopa-levodopa (PARCOPA) 10-100 MG per disintegrating tablet Take 1 tablet by mouth at bedtime.       . Cholecalciferol (VITAMIN D3) 2000 UNITS TABS Take 1 tablet by mouth daily.      [name] COLCRYS 0.6 MG tablet Take 0.6 mg by mouth daily.      . Dapagliflozin Propanediol (FARXIGA) 10 MG TABS Take 1 tablet by mouth every morning.      [name] DOCUSATE SODIUM PO Take 2 tablets by mouth at bedtime.      . fish oil-omega-3 fatty acids 1000 MG capsule Take 2 g by mouth daily.      [name] HYDROcodone-acetaminophen (NORCO/VICODIN) 5-325 MG per tablet       . levothyroxine (SYNTHROID, LEVOTHROID) 175 MCG tablet Take 175 mcg by mouth daily before breakfast. 2 tablets      . losartan-hydrochlorothiazide (HYZAAR) 100-25 MG per tablet Take 1 tablet by mouth daily.      . metformin (FORTAMET) 500 MG (OSM) 24 hr tablet Take 500 mg by mouth daily with breakfast.      . metoprolol succinate (TOPROL-XL) 25 MG 24 hr tablet Take 25 mg by mouth daily.      . Multiple Vitamin (MULTIVITAMIN) tablet Take 1 tablet by mouth daily.      [name] oxymetazoline (AFRIN) 0.05 % nasal spray Place 2 sprays into the nose 2 (two) times daily.      . ranitidine (ZANTAC) 150 MG tablet Take 150 mg by mouth 2 (two) times daily.      . rizatriptan (MAXALT) 10 MG tablet  Take 10 mg by mouth as needed  for migraine. May repeat in 2 hours if needed      . rOPINIRole (REQUIP) 1 MG tablet Take 1 mg by mouth 4 (four) times daily.       . simvastatin (ZOCOR) 20 MG tablet Take 20 mg by mouth every evening.      . TRAVATAN Z 0.004 % SOLN ophthalmic solution        No current facility-administered medications for this visit.    Allergies as of 08/20/2013  . (No Known Allergies)    Vitals: BP 138/88  Pulse 67  Resp 18  Ht 5' 7.25" (1.708 m)  Wt 238 lb (107.956 kg)  BMI 37.01 kg/m2 Last Weight:  Wt Readings from Last 1 Encounters:  08/20/13 238 lb (107.956 kg)   Last Height:   Ht Readings from Last 1 Encounters:  08/20/13 5' 7.25" (1.708 m)    Physical exam:  General: The patient is awake, alert and appears not in acute distress. The patient is well groomed. Head: Normocephalic, atraumatic. Neck is supple. Mallampati 1 wide open- red upper airway( GERD )  neck circumference: 20 inches  , no nasal deviation.  No TMJ . Tonsillectomy at age 20 .  Cardiovascular:  Regular rate and rhythm , without  murmurs or carotid bruit, and without distended neck veins. Respiratory: Lungs are clear to auscultation. Skin:  Without evidence of edema, or rash Trunk: BMI is elevated and patient  has normal posture.  Neurologic exam : The patient is awake and alert, oriented to place and time.   Memory subjective  described as intact. There is a normal attention span & concentration ability.  Speech is fluent without  dysarthria, dysphonia or aphasia.  Mood and affect are appropriate.  Cranial nerves: Pupils are equal and briskly reactive to light. Funduscopic exam without  evidence of pallor or edema. Extraocular movements  in vertical and horizontal planes intact and without nystagmus. Visual fields by finger perimetry are intact. Hearing to finger rub intact. Facial sensation intact to fine touch. Facial motor strength is symmetric, tongue and uvula move midline.  Motor exam:   Normal tone and  normal muscle bulk and symmetric normal strength in all extremities  He had undergone left knee surgery , full replacement repeat.   Sensory:  Fine touch, pinprick and vibration were tested in all extremities.  Proprioception is tested in the upper extremities only. This was  normal.  Coordination: Rapid alternating movements in the fingers/hands is tested and normal.  Finger-to-nose maneuver tested and normal without evidence of ataxia, dysmetria or tremor.  Gait and station: Patient walks without assistive device and is able and assisted stool climb up to the exam table.  Strength within normal limits. Stance is stable and normal. Steps are unfragmented. Romberg testing is normal.  Deep tendon reflexes: in the  upper and lower extremities are symmetric and intact. Babinski maneuver response is  downgoing.   Assessment:   OSA, complex apnea on 16 cm water. In office and at [facility] today , CPAP  download obtained. AHi is  6.9 , and compliance is 90 days at 96%   BMI morbidly obese, SOB , wheezing.   Plan:   Will order autotitration , start at 8 cm water and set maximum to 16 cm water , the patient  Complaints about noises that the machine makes when first switched on.   CSA/ OSA : BMI remains  main risk factor. Patient is diabetic. Glucosuria.  Exercise has been difficult to due to SOB and  joint pain. Discussed lifestyle changes 50% of visit time.

## 2013-08-21 ENCOUNTER — Other Ambulatory Visit: Payer: Self-pay | Admitting: *Deleted

## 2013-08-21 DIAGNOSIS — G4733 Obstructive sleep apnea (adult) (pediatric): Secondary | ICD-10-CM

## 2014-08-25 ENCOUNTER — Encounter: Payer: Self-pay | Admitting: Neurology

## 2014-08-25 ENCOUNTER — Ambulatory Visit: Payer: Medicare Other | Admitting: Neurology

## 2014-08-25 ENCOUNTER — Ambulatory Visit (INDEPENDENT_AMBULATORY_CARE_PROVIDER_SITE_OTHER): Payer: Medicare Other | Admitting: Neurology

## 2014-08-25 ENCOUNTER — Telehealth: Payer: Self-pay | Admitting: *Deleted

## 2014-08-25 VITALS — BP 157/93 | HR 76 | Resp 24 | Wt 242.4 lb

## 2014-08-25 DIAGNOSIS — G2581 Restless legs syndrome: Secondary | ICD-10-CM | POA: Diagnosis not present

## 2014-08-25 DIAGNOSIS — M10072 Idiopathic gout, left ankle and foot: Secondary | ICD-10-CM | POA: Diagnosis not present

## 2014-08-25 DIAGNOSIS — G4733 Obstructive sleep apnea (adult) (pediatric): Secondary | ICD-10-CM

## 2014-08-25 DIAGNOSIS — M1000 Idiopathic gout, unspecified site: Secondary | ICD-10-CM | POA: Insufficient documentation

## 2014-08-25 DIAGNOSIS — Z9989 Dependence on other enabling machines and devices: Secondary | ICD-10-CM | POA: Insufficient documentation

## 2014-08-25 MED ORDER — REQUIP XL 4 MG PO TB24: ORAL_TABLET | ORAL | Status: DC

## 2014-08-25 MED ORDER — CARBIDOPA-LEVODOPA 10-100 MG PO TBDP: 1.0000 | ORAL_TABLET | Freq: Every day | ORAL | Status: DC

## 2014-08-25 NOTE — Telephone Encounter (Signed)
[name] calling needing order clarification on Requip XL 4 mg 24hr (directions 1 4 mg tab po qhs), and use carbidopa-levodopa 10-100 (1 tablet po mouth at bedtime prn).  Pharmacist called and relayed that pt will get 30 day suppy then PA required.

## 2014-08-25 NOTE — Progress Notes (Signed)
[facility]  Provider:  Larey Seat, M D  Referring Provider: [name], MD Primary Care Physician:  Donnajean Lopes, MD  Chief Complaint  Patient presents with  . RV cpap    Rm 10, alone    HPI:  [name] is a 74 y.o. male  Is seen here as a  revisit  from Dr. [name] for CPAP compliance every 12 month .    The patient  underwent a split study in 2011 at [place] [facility]. His AHI was 77 per hour and he was titrated to 16 cm water pressure with a 3 cm EPR. The residual AHI became lower than 5. A download reorder from August 2013 showed 100% compliance. I was unable today to obtain the data for 2014 but  asked [name] 'es DME - [facility] here in [place]) to provide [place] with the most recent data. He got this download per [name] today.  Today the download shows been 96% compliance over the last 90 days, average time of CPAP use per day at 6 hours and 52 minutes, residual AHI is 6.9 and the sac pressure is 16 cm water with 3 cm EPR there is a moderately high air leak, the pressure is so high his mask blows off his face. He needs a new mask and headgear soon.  I would like for him to have an auto-titrator machine with a setting from 8 through 16 cm water . He states he never sleeps without his CPAP and likes the machines effect on his sleep, rest, agility.  He has 2- 3 nocturia breaks interrupting his sleep. Bedtime is between 23 hours and midnight. Subjective getting 7-8 hours of sleep , wakes up spontaneously at 7 AM.  Breakfast with caffeine , 2-3 cups of coffee.    Last visit note; 02-18-13  He has been in the past 100% compliant-  He has increasingly problems with GOUT and had an attack 3 days ago.  3 weeks ago he developed a severe pain in shoulder and elbow on the right , His feet started hurting bad, uric acid was high. When [name] initiated Colcrys, he got immediately better. He reports getting short of breath easily, he has not been able to lose weight.  Dr. [name] initiated diabetes  Medication to reduce glucosuria.   Interval history 08-25-14   [name] has been doing well in terms of his gout and his gout pain has been no longer evident. He continues to use his CPAP with high compliance I was able to review a download that shows a 30 day 100% compliance for days of use 97% compliance for 4 hours or more the patient used to machine on average 6 hours and 58 minutes daily the machine is set at 16 cm water pressure with a deep ER of 3 cm. The residual AHI however was higher this time at 9.5 and air leak is not to be blamed for the higher apnea hypopnea index. He is in need of a new insert he reports and this may be a fitting issue for the mask. I also would like to set his machine to an outer titration perhaps between 12 and 18 cm. He feels comfortable with using his machine and he reports falling asleep promptly and sleeping on average 7 hours at night. Her sleepiness score today was 11 points and his fatigue severity score 19 points. I reviewed his medication list. He reports that his restless legs however drives him crazy  some days he is on 1 mg of Requip up to 4 times a day and he takes carbidopa levodopa 1 tablet at bedtime. The patient takes 1 mg Requip  Tabs, 4 tablets together at 3 PM and one in the morning. i will change this ti o a 24 XR prescription.      Review of Systems: Out of a complete 14 system review, the patient complains of only the following symptoms, and all other reviewed systems are negative.  SOB, fatigue reduced. GDS one points, Epworth 7 points, FSS 32 points, shoulder pain keeps him from sleeping.  He adjusted his diet , his wife no longer cooks fried foods, more whole wheat , salads. His son is a Child psychotherapist and supplies the couple with whole wheat .    History   Social History  . Marital Status: Married    Spouse Name: [name]  . Number of Children: 2  . Years of Education: COLLEGE   Occupational History  .     Social  History Main Topics  . Smoking status: Former Smoker    Quit date: 06/19/2005  . Smokeless tobacco: Never Used  . Alcohol Use: No  . Drug Use: No  . Sexual Activity: Not on file   Other Topics Concern  . Not on file   Social History Narrative   Patient is married [name]) and lives at home with his wife.   Patient has two children.   Patient is retired.   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   Patient is right-handed.   Patient drinks three cups of coffee per day.    Family History  Problem Relation Age of Onset  . COPD Mother   . Hyperlipidemia Mother   . Hypertension Mother   . Diabetes Father   . Kidney disease Father     ESRD  . Hypertension Father   . Cancer Father   . Hyperlipidemia Father     Past Medical History  Diagnosis Date  . Hypertension   . GERD (gastroesophageal reflux disease)   . Barrett esophagus   . Chronic kidney disease   . OSA (obstructive sleep apnea)   . Restless leg   . CAD (coronary artery disease)   . Diabetes mellitus without complication   . Ulcer     Peptic ulcer disease  . COPD (chronic obstructive pulmonary disease)   . Carotid artery occlusion   . Cancer     thyroid  . Thyroid disease   . History of thyroid cancer 2008  . History of thyroid cancer   . Sleep apnea with use of continuous positive airway pressure (CPAP)     [facility] , AHI  77cn central and obstrcutive. 16 cm water , 3 cm EPR.     Past Surgical History  Procedure Laterality Date  . Carotid endarterectomy Left January 03, 2006    Dr. Amedeo Plenty  . Joint replacement Left     knee X 2  . Thyroidectomy  2008    Current Outpatient Prescriptions  Medication Sig Dispense Refill  . aspirin 325 MG tablet Take 325 mg by mouth daily.    . betamethasone dipropionate (DIPROLENE) 0.05 % cream Apply topically 2 (two) times daily.    . calcium citrate-vitamin D (CITRACAL+D) 315-200 MG-UNIT per tablet Take 1 tablet by mouth 2 (two) times  daily. 750 mg    . carbidopa-levodopa (PARCOPA) 10-100 MG per disintegrating tablet Take 1 tablet by mouth at  bedtime.     . Cholecalciferol (VITAMIN D3) 2000 UNITS TABS Take 1 tablet by mouth daily.    [name] COLCRYS 0.6 MG tablet Take 0.6 mg by mouth daily.    [name] DOCUSATE SODIUM PO Take 2 tablets by mouth at bedtime.    . fish oil-omega-3 fatty acids 1000 MG capsule Take 2 g by mouth daily.    [name] HYDROcodone-acetaminophen (NORCO/VICODIN) 5-325 MG per tablet Take 1-2 tablets by mouth every 4 (four) hours as needed.     [name] levothyroxine (SYNTHROID, LEVOTHROID) 175 MCG tablet Take 175 mcg by mouth daily before breakfast. 2 tablets    . losartan-hydrochlorothiazide (HYZAAR) 100-25 MG per tablet Take 1 tablet by mouth daily.    . metformin (FORTAMET) 500 MG (OSM) 24 hr tablet Take 500 mg by mouth 2 (two) times daily with a meal.     . metoprolol tartrate (LOPRESSOR) 25 MG tablet Take 25 mg by mouth 3 (three) times daily.    . Multiple Vitamin (MULTIVITAMIN) tablet Take 1 tablet by mouth daily.    [name] oxymetazoline (AFRIN) 0.05 % nasal spray Place 2 sprays into the nose 2 (two) times daily.    . ranitidine (ZANTAC) 150 MG tablet Take 150 mg by mouth 2 (two) times daily.    . rizatriptan (MAXALT) 10 MG tablet Take 10 mg by mouth as needed for migraine. May repeat in 2 hours if needed    . rOPINIRole (REQUIP) 1 MG tablet Take 1 mg by mouth 4 (four) times daily.     . simvastatin (ZOCOR) 20 MG tablet Take 20 mg by mouth every evening.    . TRAVATAN Z 0.004 % SOLN ophthalmic solution Place 1 drop into both eyes.      No current facility-administered medications for this visit.    Allergies as of 08/25/2014  . (No Known Allergies)    Vitals: BP 157/93 mmHg  Pulse 76  Resp 24  Wt 242 lb 6.4 oz (109.952 kg) Last Weight:  Wt Readings from Last 1 Encounters:  08/25/14 242 lb 6.4 oz (109.952 kg)   Last Height:   Ht Readings from Last 1 Encounters:  08/20/13 5' 7.25" (1.708 m)    Physical  exam:  General: The patient is awake, alert and appears not in acute distress. The patient is well groomed. Head: Normocephalic, atraumatic. Neck is supple. Mallampati 1 wide open- red upper airway( GERD )  neck circumference: 20 inches  , no nasal deviation.  No TMJ . Tonsillectomy at age 55 .  Cardiovascular:  Regular rate and rhythm , without  murmurs or carotid bruit, and without distended neck veins. Respiratory: Lungs are clear to auscultation. Skin:  Without evidence of edema, or rash Trunk: BMI is elevated and patient  has normal posture.  Neurologic exam : The patient is awake and alert, oriented to place and time.   Memory subjective  described as intact. There is a normal attention span & concentration ability.  Speech is fluent without  dysarthria, dysphonia or aphasia.  Mood and affect are appropriate.  Cranial nerves: Pupils are equal and briskly reactive to light. Funduscopic exam without  evidence of pallor or edema. Extraocular movements  in vertical and horizontal planes intact and without nystagmus. Visual fields by finger perimetry are intact. Hearing to finger rub intact. Facial sensation intact to fine touch. Facial motor strength is symmetric, tongue and uvula move midline.  Motor exam:   Normal tone and normal muscle bulk and symmetric normal strength in all  extremities  He had undergone left knee surgery , full replacement repeat.   Sensory:  Fine touch, pinprick and vibration were tested in all extremities.  Proprioception is tested in the upper extremities only. This was  normal.  Coordination: Rapid alternating movements in the fingers/hands is tested and normal.  Finger-to-nose maneuver tested and normal without evidence of ataxia, dysmetria or tremor.  Gait and station: Patient walks without assistive device and is able and assisted stool climb up to the exam table.  Strength within normal limits. Stance is stable and normal. Steps are unfragmented. Romberg  testing is normal.  Deep tendon reflexes: in the  upper and lower extremities are symmetric and intact. Babinski maneuver response is  downgoing.   Assessment:   OSA, complex apnea on 16 cm water. In office and at [facility] today , CPAP  download obtained. AHi is  6.9 , and compliance is 90 days at 96%   BMI morbidly obese, SOB , wheezing.   Plan:   Will order autotitration , start at 8 cm water and set maximum to 16 cm water , the patient complaints about noises that the machine makes when first switched on.  CSA/ OSA : BMI remains main risk factor. Patient is diabetic. Has  Glucosuria.  Exercise has been difficult to due to SOB and  joint pain. Discussed lifestyle changes 50% of visit time.

## 2014-08-28 ENCOUNTER — Encounter: Payer: Self-pay | Admitting: Neurology

## 2014-09-03 ENCOUNTER — Telehealth: Payer: Self-pay

## 2014-09-03 MED ORDER — CARBIDOPA-LEVODOPA 10-100 MG PO TABS: 1.0000 | ORAL_TABLET | Freq: Three times a day (TID) | ORAL | Status: DC

## 2014-09-03 NOTE — Telephone Encounter (Signed)
[facility] sent [place] a fax saying the patient advised them they are taking Carb/Levo 10-100mg  one three times daily instead of one at bedtime as prescribed.  They are requesting a new Rx for TID dose.  Okay to change?  Please advise.  Thank you.

## 2014-09-03 NOTE — Telephone Encounter (Signed)
Ok to change

## 2014-09-16 ENCOUNTER — Telehealth: Payer: Self-pay | Admitting: Neurology

## 2014-09-16 NOTE — Telephone Encounter (Signed)
I called back.  They will mail a letter with reason for denial as well as instructions for appeal.

## 2014-09-16 NOTE — Telephone Encounter (Signed)
Tylara with [facility] @ [phone], stated Rx ROPINOROLE 4 MG has been denied...FYI

## 2014-10-05 ENCOUNTER — Telehealth: Payer: Self-pay | Admitting: Neurology

## 2014-10-05 NOTE — Telephone Encounter (Signed)
OV notes have been faxed.

## 2014-10-05 NOTE — Telephone Encounter (Signed)
[name] with [facility] @ [phone], stated additional OV notes are needed for Rx ROPINOROLE.  Please fax to [phone].

## 2014-11-20 ENCOUNTER — Ambulatory Visit: Payer: Medicare Other | Admitting: Family

## 2014-11-20 ENCOUNTER — Other Ambulatory Visit (HOSPITAL_COMMUNITY): Payer: Medicare Other

## 2015-02-09 ENCOUNTER — Other Ambulatory Visit: Payer: Self-pay | Admitting: Neurology

## 2015-03-01 ENCOUNTER — Ambulatory Visit (INDEPENDENT_AMBULATORY_CARE_PROVIDER_SITE_OTHER): Payer: Medicare Other | Admitting: Adult Health

## 2015-03-01 ENCOUNTER — Telehealth: Payer: Self-pay

## 2015-03-01 ENCOUNTER — Encounter: Payer: Self-pay | Admitting: Adult Health

## 2015-03-01 ENCOUNTER — Telehealth: Payer: Self-pay | Admitting: Neurology

## 2015-03-01 VITALS — BP 153/81 | HR 72 | Ht 67.0 in | Wt 239.0 lb

## 2015-03-01 DIAGNOSIS — G2581 Restless legs syndrome: Secondary | ICD-10-CM | POA: Diagnosis not present

## 2015-03-01 DIAGNOSIS — G4733 Obstructive sleep apnea (adult) (pediatric): Secondary | ICD-10-CM

## 2015-03-01 DIAGNOSIS — Z9989 Dependence on other enabling machines and devices: Secondary | ICD-10-CM

## 2015-03-01 NOTE — Telephone Encounter (Signed)
I contacted the patient's ins regarding coverage for Requip XL.  They are reviewing the request again, and will call us back within the next 3 days regarding this.  Ref # [id number]

## 2015-03-01 NOTE — Progress Notes (Signed)
I agree with the assessment and plan as directed by NP .The patient is known to me .   Sami Froh, MD  

## 2015-03-01 NOTE — Telephone Encounter (Signed)
Updated this in the chart. Please make patient aware.

## 2015-03-01 NOTE — Progress Notes (Signed)
PATIENT: [name] DOB: [birth year]  REASON FOR VISIT: follow up- OSA on CPAP, RLS HISTORY FROM: patient  HISTORY OF PRESENT ILLNESS: [name] is a 74 year old male with a history of obstructive sleep apnea on CPAP and restless leg syndrome. He returns today for follow-up. The patient's compliance report shows that he uses machine 28 out of 30 days for compliance of 93%. He uses machine greater than 4 hours 27 out of 30 days for compliance of 90%. On average he uses his machine 6 hours and 38 minutes. His residual AHI is 10.5 on 16 cm of water with EPR of 3. The patient's leak in the 95th percentile is 14.7 L/m. The patient is currently using a full face mask. He states that it does leave red marks due to how tight the mask is. In the past he has tried the nasal mask and pillows but due to congestion he cannot tolerate these. The patient continues to take Requip 4 mg at bedtime. He states that extended release was ordered but his insurance did not approve this medication. He states that his bottle states that he can take up to 5 mg daily. He states that sometimes this works very well for him other times he does not get much benefit from the Requip. He is also on Sinemet however he has not noticed any change in his symptoms since starting Sinemet. He returns today for an evaluation.  HISTORY 08/25/14 [facility]  [name] is a 74 y.o. male Is seen here as a revisit from [name] for CPAP compliance every 12 month .    The patient underwent a split study in 2011 at [place] [facility]. His AHI was 77 per hour and he was titrated to 16 cm water pressure with a 3 cm EPR. The residual AHI became lower than 5. A download reorder from August 2013 showed 100% compliance. I was unable today to obtain the data for 2014 but asked [name] 'es DME - [facility] here in [place]) to provide [place] with the most recent data. He got this download per [name] today.  Today the download shows been 96% compliance  over the last 90 days, average time of CPAP use per day at 6 hours and 52 minutes, residual AHI is 6.9 and the sac pressure is 16 cm water with 3 cm EPR there is a moderately high air leak, the pressure is so high his mask blows off his face. He needs a new mask and headgear soon.  I would like for him to have an auto-titrator machine with a setting from 8 through 16 cm water . He states he never sleeps without his CPAP and likes the machines effect on his sleep, rest, agility.  He has 2- 3 nocturia breaks interrupting his sleep. Bedtime is between 23 hours and midnight. Subjective getting 7-8 hours of sleep , wakes up spontaneously at 7 AM. Breakfast with caffeine , 2-3 cups of coffee.   Last visit note; 02-18-13  He has been in the past 100% compliant-  He has increasingly problems with GOUT and had an attack 3 days ago.  3 weeks ago he developed a severe pain in shoulder and elbow on the right , His feet started hurting bad, uric acid was high. When [name]. initiated Colcrys, he got immediately better. He reports getting short of breath easily, he has not been able to lose weight. [name] initiated diabetes Medication to reduce glucosuria.   Interval history  08-25-14   [name] has been doing well in terms of his gout and his gout pain has been no longer evident. He continues to use his CPAP with high compliance I was able to review a download that shows a 30 day 100% compliance for days of use 97% compliance for 4 hours or more the patient used to machine on average 6 hours and 58 minutes daily the machine is set at 16 cm water pressure with a deep ER of 3 cm. The residual AHI however was higher this time at 9.5 and air leak is not to be blamed for the higher apnea hypopnea index. He is in need of a new insert he reports and this may be a fitting issue for the mask. I also would like to set his machine to an outer titration perhaps between 12 and 18 cm. He feels comfortable with using his  machine and he reports falling asleep promptly and sleeping on average 7 hours at night. Her sleepiness score today was 11 points and his fatigue severity score 19 points. I reviewed his medication list. He reports that his restless legs however drives him crazy some days he is on 1 mg of Requip up to 4 times a day and he takes carbidopa levodopa 1 tablet at bedtime. The patient takes 1 mg Requip Tabs, 4 tablets together at 3 PM and one in the morning. i will change this ti o a 24 XR prescription.    REVIEW OF SYSTEMS: Out of a complete 14 system review of symptoms, the patient complains only of the following symptoms, and all other reviewed systems are negative.  Shortness of breath, eye itching, restless leg, apnea, daytime sleepiness, frequency of urination, headache, bruise/bleed easily Epworth sleepiness score is 15 and fatigue severity score is 47  ALLERGIES: No Known Allergies  HOME MEDICATIONS: Outpatient Prescriptions Prior to Visit  Medication Sig Dispense Refill  . aspirin 325 MG tablet Take 325 mg by mouth daily.    . betamethasone dipropionate (DIPROLENE) 0.05 % cream Apply topically 2 (two) times daily.    . calcium citrate-vitamin D (CITRACAL+D) 315-200 MG-UNIT per tablet Take 1 tablet by mouth 2 (two) times daily. 750 mg    . carbidopa-levodopa (SINEMET IR) 10-100 MG per tablet TAKE 1 TABLET BY MOUTH 3 TIMES DAILY. 90 tablet 0  . Cholecalciferol (VITAMIN D3) 2000 UNITS TABS Take 1 tablet by mouth daily.    [name] COLCRYS 0.6 MG tablet Take 0.6 mg by mouth daily.    [name] DOCUSATE SODIUM PO Take 2 tablets by mouth at bedtime.    . fish oil-omega-3 fatty acids 1000 MG capsule Take 2 g by mouth daily.    [name] HYDROcodone-acetaminophen (NORCO/VICODIN) 5-325 MG per tablet Take 1-2 tablets by mouth every 4 (four) hours as needed.     [name] levothyroxine (SYNTHROID, LEVOTHROID) 175 MCG tablet Take 175 mcg by mouth daily before breakfast. 2 tablets    . losartan-hydrochlorothiazide (HYZAAR) 100-25  MG per tablet Take 1 tablet by mouth daily.    . metformin (FORTAMET) 500 MG (OSM) 24 hr tablet Take 500 mg by mouth 2 (two) times daily with a meal.     . metoprolol tartrate (LOPRESSOR) 25 MG tablet Take 25 mg by mouth 3 (three) times daily.    . Multiple Vitamin (MULTIVITAMIN) tablet Take 1 tablet by mouth daily.    [name] oxymetazoline (AFRIN) 0.05 % nasal spray Place 2 sprays into the nose 2 (two) times daily.    [name]  ranitidine (ZANTAC) 150 MG tablet Take 150 mg by mouth 2 (two) times daily.    . REQUIP XL 4 MG 24 hr tablet Please fill generic XR requip, 24 hours. (Patient taking differently: Take 4 mg by mouth at bedtime. Please fill generic XR requip, 24 hours.) 90 tablet 3  . rizatriptan (MAXALT) 10 MG tablet Take 10 mg by mouth as needed for migraine. May repeat in 2 hours if needed    . simvastatin (ZOCOR) 20 MG tablet Take 20 mg by mouth every evening.    . TRAVATAN Z 0.004 % SOLN ophthalmic solution Place 1 drop into both eyes.      No facility-administered medications prior to visit.    PAST MEDICAL HISTORY: Past Medical History  Diagnosis Date  . Hypertension   . GERD (gastroesophageal reflux disease)   . Barrett esophagus   . Chronic kidney disease   . OSA (obstructive sleep apnea)   . Restless leg   . CAD (coronary artery disease)   . Diabetes mellitus without complication   . Ulcer     Peptic ulcer disease  . COPD (chronic obstructive pulmonary disease)   . Carotid artery occlusion   . Cancer     thyroid  . Thyroid disease   . History of thyroid cancer 2008  . History of thyroid cancer   . Sleep apnea with use of continuous positive airway pressure (CPAP)     2011 [facility] , AHI  77cn central and obstrcutive. 16 cm water , 3 cm EPR.     PAST SURGICAL HISTORY: Past Surgical History  Procedure Laterality Date  . Carotid endarterectomy Left January 03, 2006    [name]  . Joint replacement Left     knee X 2  . Thyroidectomy  2008    FAMILY HISTORY: Family  History  Problem Relation Age of Onset  . COPD Mother   . Hyperlipidemia Mother   . Hypertension Mother   . Diabetes Father   . Kidney disease Father     ESRD  . Hypertension Father   . Cancer Father   . Hyperlipidemia Father     SOCIAL HISTORY: Social History   Social History  . Marital Status: Married    Spouse Name: [name]  . Number of Children: 2  . Years of Education: COLLEGE   Occupational History  .     Social History Main Topics  . Smoking status: Former Smoker    Quit date: 06/19/2005  . Smokeless tobacco: Never Used  . Alcohol Use: No  . Drug Use: No  . Sexual Activity: Not on file   Other Topics Concern  . Not on file   Social History Narrative   Patient is married [name]) and lives at home with his wife.   Patient has two children.   Patient is retired.   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   Patient is right-handed.   Patient drinks three cups of coffee per day.      PHYSICAL EXAM  Filed Vitals:   03/01/15 1400  BP: 153/81  Pulse: 72  Height: 5\' 7"  (1.702 m)  Weight: 239 lb (108.41 kg)   Body mass index is 37.42 kg/(m^2).  Generalized: Well developed, in no acute distress  Neck: Circumference 19 inches, Mallampati 3+  Neurological examination  Mentation: Alert oriented to time, place, history taking. Follows all commands speech and language fluent Cranial nerve II-XII: Pupils were equal round reactive to  light. Extraocular movements were full, visual field were full on confrontational test. Facial sensation and strength were normal. Uvula tongue midline. Head turning and shoulder shrug  were normal and symmetric. Motor: The motor testing reveals 5 over 5 strength of all 4 extremities. Good symmetric motor tone is noted throughout.  Sensory: Sensory testing is intact to soft touch on all 4 extremities. No evidence of extinction is noted.  Gait and station: Gait is normal.  Reflexes: Deep tendon reflexes are  symmetric and normal bilaterally.   DIAGNOSTIC DATA (LABS, IMAGING, TESTING) - I reviewed patient records, labs, notes, testing and imaging myself where available.       ASSESSMENT AND PLAN 74 y.o. year old male  has a past medical history of Hypertension; GERD (gastroesophageal reflux disease); Barrett esophagus; Chronic kidney disease; OSA (obstructive sleep apnea); Restless leg; CAD (coronary artery disease); Diabetes mellitus without complication; Ulcer; COPD (chronic obstructive pulmonary disease); Carotid artery occlusion; Cancer; Thyroid disease; History of thyroid cancer (2008); History of thyroid cancer; and Sleep apnea with use of continuous positive airway pressure (CPAP). here with:  1. OSA on CPAP 2. RLS  At the last visit [name] requested that the patient's CPAP settings be changed to Autotitration 12-18 cm of water. I will send in a prescription to his DME company. The patient's mask should also be refitted. The patient will continue on 4 mg of Requip. I will check with our pharmacy tech to see if Requip XL can get approved. Patient verbalized understanding. He will follow-up in 4-5 months with Cesar. Mechele Claude, MSN, NP-C 03/01/2015, 2:13 PM [facility] [address] [place], [place] [address] [phone]

## 2015-03-01 NOTE — Patient Instructions (Addendum)
I will send prescription to DME changing setting and asking for your mask to be refitted. Continue Requip and Sinemet If your symptoms worsen or you develop new symptoms please let us know.

## 2015-03-01 NOTE — Telephone Encounter (Signed)
Called patient left him a message relaying we received his phone and I will forward message to [facility].

## 2015-03-01 NOTE — Telephone Encounter (Signed)
Patient is calling and states that [name] needs to know what his prescription states: Rx rotinirole prescription states 4 tablets every evening and 1 tablet in morning.  Thanks!

## 2015-03-01 NOTE — Telephone Encounter (Signed)
Called and spoke to patient and relayed chart updated.

## 2015-03-02 ENCOUNTER — Telehealth: Payer: Self-pay | Admitting: Neurology

## 2015-03-02 NOTE — Telephone Encounter (Signed)
[name] with [facility] Medicare called needing to know if patient has dx of parkinsons to make a coverage decision for Rotinirole. Please call and advise. She can be reached at [phone]

## 2015-03-02 NOTE — Telephone Encounter (Signed)
I called back.  Spoke with [employer].  She reviewed the patient file and said they actually already have the requested clinical info, and asked that we disregard the call.  Request for coverage is still being reviewed.  Nothing further is needed at this time.

## 2015-03-02 NOTE — Telephone Encounter (Signed)
[place] [phone] called to advise request for Rotinirole is not approved. Tier exception on a non formulary is not approved. Request for non formulary still pending.

## 2015-03-05 NOTE — Telephone Encounter (Signed)
Noted  

## 2015-03-05 NOTE — Telephone Encounter (Signed)
Ins has replied stating they are unable to approve coverage on Ropinirole ER, as they consider it "off label use"  Covered alternatives include regular release Ropinirole and regular release Pramipexole.

## 2015-03-10 ENCOUNTER — Other Ambulatory Visit: Payer: Self-pay | Admitting: Neurology

## 2015-05-08 ENCOUNTER — Other Ambulatory Visit: Payer: Self-pay | Admitting: Neurology

## 2015-07-01 ENCOUNTER — Encounter: Payer: Self-pay | Admitting: Neurology

## 2015-07-01 ENCOUNTER — Ambulatory Visit (INDEPENDENT_AMBULATORY_CARE_PROVIDER_SITE_OTHER): Payer: Medicare Other | Admitting: Neurology

## 2015-07-01 VITALS — BP 152/86 | HR 76 | Resp 20 | Ht 66.0 in | Wt 231.0 lb

## 2015-07-01 DIAGNOSIS — J189 Pneumonia, unspecified organism: Secondary | ICD-10-CM | POA: Diagnosis not present

## 2015-07-01 DIAGNOSIS — G4733 Obstructive sleep apnea (adult) (pediatric): Secondary | ICD-10-CM

## 2015-07-01 DIAGNOSIS — G2581 Restless legs syndrome: Secondary | ICD-10-CM | POA: Diagnosis not present

## 2015-07-01 DIAGNOSIS — G4737 Central sleep apnea in conditions classified elsewhere: Secondary | ICD-10-CM | POA: Diagnosis not present

## 2015-07-01 DIAGNOSIS — Z9989 Dependence on other enabling machines and devices: Secondary | ICD-10-CM

## 2015-07-01 DIAGNOSIS — G4731 Primary central sleep apnea: Secondary | ICD-10-CM | POA: Insufficient documentation

## 2015-07-01 MED ORDER — ROPINIROLE HCL 1 MG PO TABS: 1.0000 mg | ORAL_TABLET | Freq: Two times a day (BID) | ORAL | Status: DC

## 2015-07-01 NOTE — Patient Instructions (Signed)
Ropinirole tablets What is this medicine? ROPINIROLE (roe PIN i role) is used to treat the symptoms of Parkinson's disease. It helps to improve muscle control and movement difficulties. It is also used for the treatment of Restless Legs Syndrome. This medicine may be used for other purposes; ask your health care provider or pharmacist if you have questions. What should I tell my health care provider before I take this medicine? They need to know if you have any of these conditions: -dizzy or fainting spells -heart disease -high blood pressure -kidney disease -liver disease -low blood pressure -sleeping problems -an unusual or allergic reaction to ropinirole, other medicines, foods, dyes, or preservatives -pregnant or trying to get pregnant -breast-feeding How should I use this medicine? Take this medicine by mouth with a glass of water. Follow the directions on the prescription label. You can take it with or without food. If it upsets your stomach, take it with food. Take your doses at regular intervals. Do not take your medicine more often than directed. Do not stop taking this medicine except on your doctor's advice. Talk to your pediatrician regarding the use of this medicine in children. Special care may be needed. Overdosage: If you think you have taken too much of this medicine contact a poison control center or emergency room at once. NOTE: This medicine is only for you. Do not share this medicine with others. What if I miss a dose? If you miss a dose, take it as soon as you can. If it is almost time for your next dose, take only that dose. Do not take double or extra doses. What may interact with this medicine? -ciprofloxacin -male hormones, like estrogens and birth control pills -medicines for depression, anxiety, or psychotic disturbances -metoclopramide -mexiletine -norfloxacin -omeprazole This list may not describe all possible interactions. Give your health care provider  a list of all the medicines, herbs, non-prescription drugs, or dietary supplements you use. Also tell them if you smoke, drink alcohol, or use illegal drugs. Some items may interact with your medicine. What should I watch for while using this medicine? Visit your doctor or health care professional for regular checks on your progress. It may be several weeks or months before you feel the full effect of this medicine. You may get drowsy or dizzy. Do not drive, use machinery, or do anything that needs mental alertness until you know how this drug affects you. Do not stand or sit up quickly, especially if you are an older patient. This reduces the risk of dizzy or fainting spells. Alcohol can increase possible dizziness. Avoid alcoholic drinks. If you find that you have sudden feelings of wanting to sleep during normal activities, like cooking, watching television, or while driving or riding in a car, you should contact your health care professional. Your mouth may get dry. Chewing sugarless gum or sucking hard candy, and drinking plenty of water may help. Contact your doctor if the problem does not go away or is severe. There have been reports of increased sexual urges or other strong urges such as gambling while taking some medicines for Parkinson's disease. If you experience any of these urges while taking this medicine, you should report it to your health care provider as soon as possible. You should check your skin often for changes to moles and new growths while taking this medicine. Call your doctor if you notice any of these changes. What side effects may I notice from receiving this medicine? Side effects that you should   report to your doctor or health care professional as soon as possible: -changes in vision -chest pain -confusion -falling asleep during normal activities like driving -fast, irregular heartbeat -feeling faint or lightheaded, falls -hallucination, loss of contact with  reality -increase or decrease in blood pressure -joint or muscle pain -loss of bladder control -numbness, tingling, or prickly sensations -shortness of breath, troubled breathing, tightness in chest, or wheezing -suicidal thoughts or other mood changes -uncontrollable head, mouth, neck, arm, or leg movements -vomiting Side effects that usually do not require medical attention (report to your doctor or health care professional if they continue or are bothersome): -clumsiness, feeling unsteady, or dizziness, especially early in treatment -flushing -headache -increased sweating -nausea -tremor -yawning This list may not describe all possible side effects. Call your doctor for medical advice about side effects. You may report side effects to FDA at 1-800-FDA-1088. Where should I keep my medicine? Keep out of the reach of children. Store at room temperature between 20 and 25 degrees C (68 and 77 degrees F). Protect from light and moisture. Keep container tightly closed. Throw away any unused medicine after the expiration date. NOTE: This sheet is a summary. It may not cover all possible information. If you have questions about this medicine, talk to your doctor, pharmacist, or health care provider.    2016, Elsevier/Gold Standard. (2008-09-22 20:56:15)  

## 2015-07-01 NOTE — Progress Notes (Signed)
[facility]   Provider:  Larey Seat, M D  Referring Provider: [name], MD Primary Care Physician:  Donnajean Lopes, MD  Chief Complaint  Patient presents with  . Follow-up    cpap, and RLS, going well, no problems, rm 11, alone    HPI:  [name] is a 75 y.o. male  Is seen here as a  revisit  from Dr. [name] for CPAP compliance every 12 month .    The patient has been long time established, and  underwent a split study in 2011 at [facility]. His AHI was 77 per hour and he was titrated to 16 cm water pressure with a 3 cm EPR. The residual AHI became lower than 5. A download reorder from August 2013 showed 100% compliance. I was unable today to obtain the data for 2014 but  asked [name] 'es DME - [facility] here in [place]) to provide [place] with the most recent data. He got this download per [name] today.  Today the download shows been 96% compliance over the last 90 days, average time of CPAP use per day at 6 hours and 52 minutes, residual AHI is 6.9 and the sac pressure is 16 cm water with 3 cm EPR there is a moderately high air leak, the pressure is so high his mask blows off his face. He needs a new mask and headgear soon.  I would like for him to have an auto-titrator machine with a setting from 8 through 16 cm water . He states he never sleeps without his CPAP and likes the machines effect on his sleep, rest, agility.  He has 2- 3 nocturia breaks interrupting his sleep. Bedtime is between 23 hours and midnight. Subjective getting 7-8 hours of sleep , wakes up spontaneously at 7 AM.  Breakfast with caffeine , 2-3 cups of coffee.    RV note; 02-18-13  He has been in the past 100% compliant-  He has increasingly problems with GOUT and had an attack 3 days ago.  3 weeks ago he developed a severe pain in shoulder and elbow on the right , His feet started hurting bad, uric acid was high. When [name] initiated [place], he got  immediately better. He reports getting short of breath easily, he has not been able to lose weight. Dr. [name] initiated diabetes  Medication to reduce glucosuria.   Interval history 08-25-14   Mr. Hout has been doing well with his gout and his gout pain has been no longer evident.  He continues to use his CPAP with high compliance . I was able to review a download that shows a 30 day 100% compliance for days of use , 97% compliance for 4 hours or more.  The patient used to machine on average 6 hours and 58 minutes daily the machine is set at 16 cm water pressure with a deep ER of 3 cm. The residual AHI however was higher this time at 9.5 and air leak is not to be blamed for the higher apnea hypopnea index. He is in need of a new insert he reports and this may be a fitting issue for the mask. I also would like to set his machine to an outer titration perhaps between 12 and 18 cm. He feels comfortable with using his machine and he reports falling asleep promptly and sleeping on average 7 hours at night. Her sleepiness score today was 11 points and his fatigue severity score  19 points. I reviewed his medication list. He reports that his restless legs however drives him crazy some days he is on 1 mg of Requip up to 4 times a day and he takes carbidopa levodopa 1 tablet at bedtime. The patient takes 1 mg Requip Tabs, 4 tablets together at 3 PM and one in the morning. i will change this ti o a 24 XR prescription.  Revisit note from 07/01/2015. I me today with [name], who has an excellent compliance on his CPAP  S 9 machine. We interrogated the machine by wife 5 today here in the office and it revealed that his AHI is 6.7 with 100% compliance for days of use and an average user time of 6 hours and 6 minutes. He is using an AutoSet between 12 and 18 cm water pressure was 1 cm EPR the 95th percentile pressure is 16. He does have sometimes significant air leaks that the mask leak he is using a full face mask as  evident by the pressure marks at least on his face.he cannot tolerate nasal airflow.   He has bronchitic breathing and has been short of air while talking.  His gout remains well controlled, but her mentioned how expensive his medications are , needed to achieve this control.  He reports restless legs and filled the Johns-Hopkins Questionnaire for quality of life in RLS.      Review of Systems: Out of a complete 14 system review, the patient complains of only the following symptoms, and all other reviewed systems are negative.  SOB, fatigue reduced. GDS one points,  How likely are you to doze in the following situations: 0 = not likely, 1 = slight chance, 2 = moderate chance, 3 = high chance  Sitting and Reading? 2 Watching Television?3 Sitting inactive in a public place (theater or meeting)?2 Lying down in the afternoon when circumstances permit?2 Sitting and talking to someone?1 Sitting quietly after lunch without alcohol?3 In a car, while stopped for a few minutes in traffic?1 As a passenger in a car for an hour without a break?2  Total =17    Epworth 7 points, FSS 32 points, shoulder pain keeps him from sleeping.  He adjusted his diet , his wife no longer cooks fried foods, more whole wheat , salads. His son is a Child psychotherapist and supplies the couple with whole wheat .    Social History   Social History  . Marital Status: Married    Spouse Name: [name]  . Number of Children: 2  . Years of Education: COLLEGE   Occupational History  .     Social History Main Topics  . Smoking status: Former Smoker    Quit date: 06/19/2005  . Smokeless tobacco: Never Used  . Alcohol Use: No  . Drug Use: No  . Sexual Activity: Not on file   Other Topics Concern  . Not on file   Social History Narrative   Patient is married [name]) and lives at home with his wife.   Patient has two children.   Patient is retired.   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   Patient is  right-handed.   Patient drinks three cups of coffee per day.    Family History  Problem Relation Age of Onset  . COPD Mother   . Hyperlipidemia Mother   . Hypertension Mother   . Diabetes Father   . Kidney disease Father     ESRD  . Hypertension  Father   . Cancer Father   . Hyperlipidemia Father     Past Medical History  Diagnosis Date  . Hypertension   . GERD (gastroesophageal reflux disease)   . Barrett esophagus   . Chronic kidney disease   . OSA (obstructive sleep apnea)   . Restless leg   . CAD (coronary artery disease)   . Diabetes mellitus without complication ([place])   . Ulcer     Peptic ulcer disease  . COPD (chronic obstructive pulmonary disease) ([place])   . Carotid artery occlusion   . Cancer ([place])     thyroid  . Thyroid disease   . History of thyroid cancer 2008  . History of thyroid cancer   . Sleep apnea with use of continuous positive airway pressure (CPAP)     2011 [facility] , AHI  77cn central and obstrcutive. 16 cm water , 3 cm EPR.     Past Surgical History  Procedure Laterality Date  . Carotid endarterectomy Left January 03, 2006    Dr. Amedeo Plenty  . Joint replacement Left     knee X 2  . Thyroidectomy  2008    Current Outpatient Prescriptions  Medication Sig Dispense Refill  . allopurinol (ZYLOPRIM) 300 MG tablet   12  . aspirin 325 MG tablet Take 325 mg by mouth daily.    . betamethasone dipropionate (DIPROLENE) 0.05 % cream Apply topically 2 (two) times daily.    . calcium citrate-vitamin D (CITRACAL+D) 315-200 MG-UNIT per tablet Take 1 tablet by mouth 2 (two) times daily. 750 mg    . carbidopa-levodopa (SINEMET IR) 10-100 MG tablet TAKE 1 TABLET BY MOUTH 3 TIMES DAILY. 90 tablet 3  . Cholecalciferol (VITAMIN D3) 2000 UNITS TABS Take 1 tablet by mouth daily.    [name] COLCRYS 0.6 MG tablet Take 0.6 mg by mouth daily.    [name] DOCUSATE SODIUM PO Take 2 tablets by mouth at bedtime.    . fish oil-omega-3 fatty acids 1000 MG capsule Take 2 g by mouth  daily.    [name] HYDROcodone-acetaminophen (NORCO/VICODIN) 5-325 MG per tablet Take 1-2 tablets by mouth every 4 (four) hours as needed.     [name] levothyroxine (SYNTHROID, LEVOTHROID) 175 MCG tablet Take 175 mcg by mouth daily before breakfast. 2 tablets    . losartan-hydrochlorothiazide (HYZAAR) 100-25 MG per tablet Take 1 tablet by mouth daily.    . metformin (FORTAMET) 500 MG (OSM) 24 hr tablet Take 500 mg by mouth 2 (two) times daily with a meal.     . metoprolol tartrate (LOPRESSOR) 25 MG tablet Take 25 mg by mouth 3 (three) times daily.    . Multiple Vitamin (MULTIVITAMIN) tablet Take 1 tablet by mouth daily.    [name] oxymetazoline (AFRIN) 0.05 % nasal spray Place 2 sprays into the nose 2 (two) times daily.    . ranitidine (ZANTAC) 150 MG tablet Take 150 mg by mouth 2 (two) times daily.    . rizatriptan (MAXALT) 10 MG tablet Take 10 mg by mouth as needed for migraine. May repeat in 2 hours if needed    . rOPINIRole (REQUIP) 1 MG tablet Take 1 mg by mouth. 1 tablet in the morning and 4 tablets at bedtime.    . simvastatin (ZOCOR) 20 MG tablet Take 20 mg by mouth every evening.    . TRAVATAN Z 0.004 % SOLN ophthalmic solution Place 1 drop into both eyes.      No current facility-administered medications for this visit.  Allergies as of 07/01/2015  . (No Known Allergies)    Vitals: BP 152/86 mmHg  Pulse 76  Resp 20  Ht 5\' 6"  (1.676 m)  Wt 231 lb (104.781 kg)  BMI 37.30 kg/m2 Last Weight:  Wt Readings from Last 1 Encounters:  07/01/15 231 lb (104.781 kg)   Last Height:   Ht Readings from Last 1 Encounters:  07/01/15 5\' 6"  (1.676 m)    Physical exam:  General: The patient is awake, alert and appears not in acute distress. The patient is well groomed. Head: Normocephalic, atraumatic. Neck is supple. Mallampati 1 wide open- very red upper airway( GERD )with a   neck circumference of  20 inches ,  no nasal deviation, but chronic tendency to have nasal airflow obstruction-rhinitis. No  TMJ . Tonsillectomy at age 27 .  Cardiovascular:  Regular rate and rhythm ,  He is diaphoretic, and short of breath !  audible rhonchi /bronchitis ?   Skin:  Ankle edema, no rash. Facial pressure marks from full facemask. Trunk: BMI is elevated.  Neurologic exam : The patient is awake and alert, oriented to place and time.   Memory subjective  described as intact. There is a normal attention span & concentration ability.  Speech is fluent without dysarthria, but dysphonia or aphasia..  Mood and affect are appropriate.  Cranial nerves: Pupils are equal and briskly reactive to light. Hearing to finger rub intact. Facial sensation intact to fine touch.  Facial motor strength is symmetric, tongue and uvula move midline.  Motor exam:   Normal tone and normal muscle bulk and symmetric normal strength in all extremities  He had undergone left knee surgery, full replacement repeat.   Deep tendon reflexes: in the  upper and lower extremities are symmetric and intact. Babinski maneuver response is  downgoing.   Assessment:  Visit furation 25 minutes with quality of life, depression and RLS,  Epworth and fatigue questionnaire elevated   Interrogation of CPAP- auto setting. 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   High air leaks, full face mask is the only tolerated mask however. In office and at [facility] today , CPAP download obtained.  AHi is  6.7 , down from 6.9 , and compliance for 90 days at 100% . I will not change settings . I suspect that most of the excessive daytime sleepiness the patient presents with today has to do with a pulmonary infection.  BMI morbidly obese, SOB , not wheezing, but wet sounding lung, audible rhonchi .   Plan:    complex sleep apnea.  Remains on  autotitration , start at 8 cm water and set maximum to 16 cm water , AHI is still 6.9 and not below 5.   CSA/ OSA : BMI remains main risk factor. He has a very wet sounding lung, He is diaphoretic, and short of breath !  refer to PCP. DR [name].   Patient is diabetic. Has Glucosuria. Has gout . Has RLS - obtained the RLS [employer].  The patient endorsed that over the last 4 weeks the restless legs were distressing him in that they most of the time interrupted his routine evening activities. He feels that RLS did not keep him from attending social activities , did not make his nocturnal sleep less sound, and that the RLS does affect his ability to travel, make decisions, caused  trouble concentrating , did not  change his interests in his  Hobbies.  Exercise has been difficult  to due to SOB and  joint pain. Discussed lifestyle changes, diet  50% of visit time for coordination of care , arranging for Dr [name] to see patient for pulmonary/ respiratory symptoms.   Rv in 6 month with Ward Givens , NP   Cc Dr [name].

## 2015-10-04 ENCOUNTER — Other Ambulatory Visit: Payer: Self-pay | Admitting: Neurology

## 2015-10-05 ENCOUNTER — Ambulatory Visit (INDEPENDENT_AMBULATORY_CARE_PROVIDER_SITE_OTHER): Payer: Medicare Other | Admitting: Adult Health

## 2015-10-05 ENCOUNTER — Encounter: Payer: Self-pay | Admitting: Adult Health

## 2015-10-05 VITALS — BP 127/76 | HR 72 | Ht 66.0 in | Wt 235.2 lb

## 2015-10-05 DIAGNOSIS — G4733 Obstructive sleep apnea (adult) (pediatric): Secondary | ICD-10-CM | POA: Diagnosis not present

## 2015-10-05 DIAGNOSIS — G2581 Restless legs syndrome: Secondary | ICD-10-CM | POA: Diagnosis not present

## 2015-10-05 DIAGNOSIS — Z9989 Dependence on other enabling machines and devices: Secondary | ICD-10-CM

## 2015-10-05 NOTE — Patient Instructions (Addendum)
Continue using CPAP nightly Continue Sinemet and requip  .

## 2015-10-05 NOTE — Progress Notes (Signed)
I agree with the assessment and plan as directed by NP .The patient is known to me .   Dolton Shaker, MD  

## 2015-10-05 NOTE — Progress Notes (Signed)
PATIENT: [name] DOB: [birth year]  REASON FOR VISIT: follow up- obstructive sleep apnea on CPAP, restless leg syndrome HISTORY FROM: patient  HISTORY OF PRESENT ILLNESS: [name] is a 75 year old male with a history of obstructive sleep apnea on CPAP and restless leg syndrome. He returns today for an evaluation. His download indicates that he uses machine 30 out of 30 days for compliance of 100%. He uses machine greater than 4 hours 30 out of 30 days for compliance of 100%. On average he uses his machine 5 hours and 52 minutes. His minimum pressure is 12  cm of water and maximum pressure of 18 cm of water with EPR of 1. His residual AHI is 9.7. He does have a significant leak in the 95th percentile at 29.1 L/m. The patient states that he has tried nasal mask in the past but was unable to tolerate. He states he does change out his mask and straps regularly. He feels that his restless legs symptoms are under relatively good control with Sinemet and Requip. He states that he does have bad days. He denies any new neurological symptoms. He returns today for an evaluation.  HISTORY [facility]): [name] is a 75 y.o. male Is seen here as a revisit from Dr. [name] for CPAP compliance every 12 month .  The patient has been long time established, and underwent a split study in 2011 at [place]. His AHI was 77 per hour and he was titrated to 16 cm water pressure with a 3 cm EPR. The residual AHI became lower than 5. A download reorder from August 2013 showed 100% compliance. I was unable today to obtain the data for 2014 but asked [name] 'es DME - [facility] here in [place]) to provide [place] with the most recent data. He got this download per [name] today.  Today the download shows been 96% compliance over the last 90 days, average time of CPAP use per day at 6 hours and 52 minutes, residual AHI is 6.9 and the sac pressure is 16 cm water with 3 cm EPR there is a moderately high air leak, the  pressure is so high his mask blows off his face. He needs a new mask and headgear soon.  I would like for him to have an auto-titrator machine with a setting from 8 through 16 cm water . He states he never sleeps without his CPAP and likes the machines effect on his sleep, rest, agility.  He has 2- 3 nocturia breaks interrupting his sleep. Bedtime is between 23 hours and midnight. Subjective getting 7-8 hours of sleep , wakes up spontaneously at 7 AM. Breakfast with caffeine , 2-3 cups of coffee.   RV note; 02-18-13  He has been in the past 100% compliant-  He has increasingly problems with GOUT and had an attack 3 days ago.  3 weeks ago he developed a severe pain in shoulder and elbow on the right , His feet started hurting bad, uric acid was high. When [name] initiated Colcrys, he got immediately better. He reports getting short of breath easily, he has not been able to lose weight. Dr. [name] initiated diabetes Medication to reduce glucosuria.   Interval history 08-25-14   [name] has been doing well with his gout and his gout pain has been no longer evident.  He continues to use his CPAP with high compliance . I was able to review a download that shows a 30 day 100%  compliance for days of use , 97% compliance for 4 hours or more. The patient used to machine on average 6 hours and 58 minutes daily the machine is set at 16 cm water pressure with a deep ER of 3 cm. The residual AHI however was higher this time at 9.5 and air leak is not to be blamed for the higher apnea hypopnea index. He is in need of a new insert he reports and this may be a fitting issue for the mask. I also would like to set his machine to an outer titration perhaps between 12 and 18 cm. He feels comfortable with using his machine and he reports falling asleep promptly and sleeping on average 7 hours at night. Her sleepiness score today was 11 points and his fatigue severity score 19 points. I reviewed his medication  list. He reports that his restless legs however drives him crazy some days he is on 1 mg of Requip up to 4 times a day and he takes carbidopa levodopa 1 tablet at bedtime. The patient takes 1 mg Requip Tabs, 4 tablets together at 3 PM and one in the morning. i will change this ti o a 24 XR prescription.  Revisit note from 07/01/2015. I me today with [name], who has an excellent compliance on his CPAP S 9 machine. We interrogated the machine by wife 5 today here in the office and it revealed that his AHI is 6.7 with 100% compliance for days of use and an average user time of 6 hours and 6 minutes. He is using an AutoSet between 12 and 18 cm water pressure was 1 cm EPR the 95th percentile pressure is 16. He does have sometimes significant air leaks that the mask leak he is using a full face mask as evident by the pressure marks at least on his face.he cannot tolerate nasal airflow.  He has bronchitic breathing and has been short of air while talking.  His gout remains well controlled, but her mentioned how expensive his medications are , needed to achieve this control.  He reports restless legs and filled the Johns-Hopkins Questionnaire for quality of life in RLS.     REVIEW OF SYSTEMS: Out of a complete 14 system review of symptoms, the patient complains only of the following symptoms, and all other reviewed systems are negative.  Runny nose, cough, wheezing, shortness of breath, restless leg, apnea, snoring, frequency of urination, headache, bruise/bleed easily  ALLERGIES: No Known Allergies  HOME MEDICATIONS: Outpatient Prescriptions Prior to Visit  Medication Sig Dispense Refill  . allopurinol (ZYLOPRIM) 300 MG tablet   12  . aspirin 325 MG tablet Take 325 mg by mouth daily.    . betamethasone dipropionate (DIPROLENE) 0.05 % cream Apply topically 2 (two) times daily.    . calcium citrate-vitamin D (CITRACAL+D) 315-200 MG-UNIT per tablet Take 1 tablet by mouth 2 (two) times daily.  750 mg    . carbidopa-levodopa (SINEMET IR) 10-100 MG tablet TAKE 1 TABLET BY MOUTH 3 TIMES DAILY. 90 tablet 4  . Cholecalciferol (VITAMIN D3) 2000 UNITS TABS Take 1 tablet by mouth daily.    [name] COLCRYS 0.6 MG tablet Take 0.6 mg by mouth daily.    . fish oil-omega-3 fatty acids 1000 MG capsule Take 2 g by mouth daily.    [name] HYDROcodone-acetaminophen (NORCO/VICODIN) 5-325 MG per tablet Take 1-2 tablets by mouth every 4 (four) hours as needed.     [name] levothyroxine (SYNTHROID, LEVOTHROID) 175 MCG tablet Take 175  mcg by mouth daily before breakfast. 2 tablets    . losartan-hydrochlorothiazide (HYZAAR) 100-25 MG per tablet Take 1 tablet by mouth daily.    . metformin (FORTAMET) 500 MG (OSM) 24 hr tablet Take 500 mg by mouth 2 (two) times daily with a meal.     . metoprolol tartrate (LOPRESSOR) 25 MG tablet Take 25 mg by mouth 3 (three) times daily.    . Multiple Vitamin (MULTIVITAMIN) tablet Take 1 tablet by mouth daily.    [name] oxymetazoline (AFRIN) 0.05 % nasal spray Place 2 sprays into the nose 2 (two) times daily.    . ranitidine (ZANTAC) 150 MG tablet Take 150 mg by mouth 2 (two) times daily.    . rizatriptan (MAXALT) 10 MG tablet Take 10 mg by mouth as needed for migraine. May repeat in 2 hours if needed    . rOPINIRole (REQUIP) 1 MG tablet Take 1 tablet (1 mg total) by mouth 2 (two) times daily. 1 tablet in the morning and 4 tablets at bedtime. 150 tablet 5  . simvastatin (ZOCOR) 20 MG tablet Take 20 mg by mouth every evening.    . TRAVATAN Z 0.004 % SOLN ophthalmic solution Place 1 drop into both eyes.     . DOCUSATE SODIUM PO Take 2 tablets by mouth at bedtime.     No facility-administered medications prior to visit.    PAST MEDICAL HISTORY: Past Medical History  Diagnosis Date  . Hypertension   . GERD (gastroesophageal reflux disease)   . Barrett esophagus   . Chronic kidney disease   . OSA (obstructive sleep apnea)   . Restless leg   . CAD (coronary artery disease)   . Diabetes  mellitus without complication ([place])   . Ulcer     Peptic ulcer disease  . COPD (chronic obstructive pulmonary disease) ([place])   . Carotid artery occlusion   . Cancer ([place])     thyroid  . Thyroid disease   . History of thyroid cancer 2008  . History of thyroid cancer   . Sleep apnea with use of continuous positive airway pressure (CPAP)     2011 [facility] , AHI  77cn central and obstrcutive. 16 cm water , 3 cm EPR.     PAST SURGICAL HISTORY: Past Surgical History  Procedure Laterality Date  . Carotid endarterectomy Left January 03, 2006    Dr. Amedeo Plenty  . Joint replacement Left     knee X 2  . Thyroidectomy  2008    FAMILY HISTORY: Family History  Problem Relation Age of Onset  . COPD Mother   . Hyperlipidemia Mother   . Hypertension Mother   . Diabetes Father   . Kidney disease Father     ESRD  . Hypertension Father   . Cancer Father   . Hyperlipidemia Father     SOCIAL HISTORY: Social History   Social History  . Marital Status: Married    Spouse Name: [name]  . Number of Children: 2  . Years of Education: COLLEGE   Occupational History  .     Social History Main Topics  . Smoking status: Former Smoker    Quit date: 06/19/2005  . Smokeless tobacco: Never Used  . Alcohol Use: No  . Drug Use: No  . Sexual Activity: Not on file   Other Topics Concern  . Not on file   Social History Narrative   Patient is married [name]) and lives at home with his wife.   Patient  has two children.   Patient is retired.   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   Patient is right-handed.   Patient drinks three cups of coffee per day.      PHYSICAL EXAM  Filed Vitals:   10/05/15 1313  BP: 127/76  Pulse: 72  Height: 5\' 6"  (1.676 m)  Weight: 235 lb 3.2 oz (106.686 kg)   Body mass index is 37.98 kg/(m^2).  Generalized: Well developed, in no acute distress  Neck: Circumference 18 inches, Mallampati 3+  Neurological examination  Mentation:  Alert oriented to time, place, history taking. Follows all commands speech and language fluent Cranial nerve II-XII: Pupils were equal round reactive to light. Extraocular movements were full, visual field were full on confrontational test. Facial sensation and strength were normal. Uvula tongue midline. Head turning and shoulder shrug  were normal and symmetric. Motor: The motor testing reveals 5 over 5 strength of all 4 extremities. Good symmetric motor tone is noted throughout.  Sensory: Sensory testing is intact to soft touch on all 4 extremities. No evidence of extinction is noted.  Coordination: Cerebellar testing reveals good finger-nose-finger and heel-to-shin bilaterally.  Gait and station: Gait is normal.  Reflexes: Deep tendon reflexes are symmetric and normal bilaterally.   DIAGNOSTIC DATA (LABS, IMAGING, TESTING) - I reviewed patient records, labs, notes, testing and imaging myself where available.  ASSESSMENT AND PLAN 75 y.o. year old male  has a past medical history of Hypertension; GERD (gastroesophageal reflux disease); Barrett esophagus; Chronic kidney disease; OSA (obstructive sleep apnea); Restless leg; CAD (coronary artery disease); Diabetes mellitus without complication ([place]); Ulcer; COPD (chronic obstructive pulmonary disease) ([place]); Carotid artery occlusion; Cancer ([place]); Thyroid disease; History of thyroid cancer (2008); History of thyroid cancer; and Sleep apnea with use of continuous positive airway pressure (CPAP). here with:  1. Obstructive sleep apnea on CPAP 2. Restless leg syndrome  The patient's compliance is excellent however he does have a significant leak and an elevated apnea index. The patient has been refitted with a ResMed Airfit F 20 medium mask. I have sent a new prescription to his CPAP supplier for new supplies including this new mask. The patient will continue on Sinemet and Requip for restless legs. Patient advised that if his symptoms worsen or he  develops any new symptoms he should let us know. He will follow-up in 3 months or sooner if needed.    Ward Givens, MSN, NP-C 10/05/2015, 2:18 PM [facility] [address], [place], [place] [address] [phone]

## 2015-11-27 ENCOUNTER — Other Ambulatory Visit: Payer: Self-pay | Admitting: Neurology

## 2015-12-16 ENCOUNTER — Other Ambulatory Visit: Payer: Self-pay | Admitting: Neurology

## 2015-12-17 ENCOUNTER — Other Ambulatory Visit: Payer: Self-pay | Admitting: Neurology

## 2015-12-29 ENCOUNTER — Ambulatory Visit (INDEPENDENT_AMBULATORY_CARE_PROVIDER_SITE_OTHER): Payer: Medicare Other | Admitting: Adult Health

## 2015-12-29 ENCOUNTER — Encounter: Payer: Self-pay | Admitting: Adult Health

## 2015-12-29 VITALS — BP 153/81 | HR 82 | Ht 66.0 in | Wt 241.0 lb

## 2015-12-29 DIAGNOSIS — G4733 Obstructive sleep apnea (adult) (pediatric): Secondary | ICD-10-CM | POA: Diagnosis not present

## 2015-12-29 DIAGNOSIS — G4730 Sleep apnea, unspecified: Secondary | ICD-10-CM

## 2015-12-29 NOTE — Progress Notes (Signed)
I agree with the assessment and plan as directed by NP .The patient is known to me .   Maveric Debono, MD  

## 2015-12-29 NOTE — Progress Notes (Signed)
PATIENT: [name] DOB: 13-Dec-1940  REASON FOR VISIT: follow up- OSA on CPAP HISTORY FROM: patient  HISTORY OF PRESENT ILLNESS:  [name] is a 75 year old male with a history of obstructive sleep apnea on CPAP. He also has restless leg syndrome. He returns today for an evaluation. At the last visit he was refitted for a new mask however he states that he was unable to get a new mask because of insurance? His download today indicates that he uses his machine 30 out of 30 days for compliance of 100%. He uses his machine 28 out of 30 days greater than 4 hours. Residual AHI of 7.9 on a minimum pressure of 12 c m water and maximum pressure of 18 cm water with EPR 1. He continues to have a significant leak and the 95th percentile at 38.2 L/m. The patient continues on Requip and Sinemet for restless leg. He states that if he can get to sleep he doesn't have any problems with his restless leg. He returns today for an evaluation.  HISTORY 10/05/15: [name] is a 75 year old male with a history of obstructive sleep apnea on CPAP and restless leg syndrome. He returns today for an evaluation. His download indicates that he uses machine 30 out of 30 days for compliance of 100%. He uses machine greater than 4 hours 30 out of 30 days for compliance of 100%. On average he uses his machine 5 hours and 52 minutes. His minimum pressure is 12 cm of water and maximum pressure of 18 cm of water with EPR of 1. His residual AHI is 9.7. He does have a significant leak in the 95th percentile at 29.1 L/m. The patient states that he has tried nasal mask in the past but was unable to tolerate. He states he does change out his mask and straps regularly. He feels that his restless legs symptoms are under relatively good control with Sinemet and Requip. He states that he does have bad days. He denies any new neurological symptoms. He returns today for an evaluation.  HISTORY [facility]): [name] is a 75 y.o. male Is seen  here as a revisit from [name] for CPAP compliance every 12 month .  The patient has been long time established, and underwent a split study in 2011 at [facility]. His AHI was 77 per hour and he was titrated to 16 cm water pressure with a 3 cm EPR. The residual AHI became lower than 5. A download reorder from August 2013 showed 100% compliance. I was unable today to obtain the data for 2014 but asked [name] 'es DME - [facility] here in [place]) to provide [place] with the most recent data. He got this download per [name] today.  Today the download shows been 96% compliance over the last 90 days, average time of CPAP use per day at 6 hours and 52 minutes, residual AHI is 6.9 and the sac pressure is 16 cm water with 3 cm EPR there is a moderately high air leak, the pressure is so high his mask blows off his face. He needs a new mask and headgear soon.  I would like for him to have an auto-titrator machine with a setting from 8 through 16 cm water . He states he never sleeps without his CPAP and likes the machines effect on his sleep, rest, agility.  He has 2- 3 nocturia breaks interrupting his sleep. Bedtime is between 23 hours and midnight. Subjective getting 7-8  hours of sleep , wakes up spontaneously at 7 AM. Breakfast with caffeine , 2-3 cups of coffee.   RV note; 02-18-13  He has been in the past 100% compliant-  He has increasingly problems with GOUT and had an attack 3 days ago.  3 weeks ago he developed a severe pain in shoulder and elbow on the right , His feet started hurting bad, uric acid was high. When [name]. initiated Colcrys, he got immediately better. He reports getting short of breath easily, he has not been able to lose weight. [name] initiated diabetes Medication to reduce glucosuria.   Interval history 08-25-14   [name] has been doing well with his gout and his gout pain has been no longer evident.  He continues to use his CPAP with high compliance . I was  able to review a download that shows a 30 day 100% compliance for days of use , 97% compliance for 4 hours or more. The patient used to machine on average 6 hours and 58 minutes daily the machine is set at 16 cm water pressure with a deep ER of 3 cm. The residual AHI however was higher this time at 9.5 and air leak is not to be blamed for the higher apnea hypopnea index. He is in need of a new insert he reports and this may be a fitting issue for the mask. I also would like to set his machine to an outer titration perhaps between 12 and 18 cm. He feels comfortable with using his machine and he reports falling asleep promptly and sleeping on average 7 hours at night. Her sleepiness score today was 11 points and his fatigue severity score 19 points. I reviewed his medication list. He reports that his restless legs however drives him crazy some days he is on 1 mg of Requip up to 4 times a day and he takes carbidopa levodopa 1 tablet at bedtime. The patient takes 1 mg Requip Tabs, 4 tablets together at 3 PM and one in the morning. i will change this ti o a 24 XR prescription.  Revisit note from 07/01/2015. I me today with [name], who has an excellent compliance on his CPAP S 9 machine. We interrogated the machine by wife 5 today here in the office and it revealed that his AHI is 6.7 with 100% compliance for days of use and an average user time of 6 hours and 6 minutes. He is using an AutoSet between 12 and 18 cm water pressure was 1 cm EPR the 95th percentile pressure is 16. He does have sometimes significant air leaks that the mask leak he is using a full face mask as evident by the pressure marks at least on his face.he cannot tolerate nasal airflow.  He has bronchitic breathing and has been short of air while talking.  His gout remains well controlled, but her mentioned how expensive his medications are , needed to achieve this control.  He reports restless legs and filled the Johns-Hopkins  Questionnaire for quality of life in RLS.     REVIEW OF SYSTEMS: Out of a complete 14 system review of symptoms, the patient complains only of the following symptoms, and all other reviewed systems are negative.  See history of present illness  ALLERGIES: No Known Allergies  HOME MEDICATIONS: Outpatient Prescriptions Prior to Visit  Medication Sig Dispense Refill  . allopurinol (ZYLOPRIM) 300 MG tablet   12  . ANORO ELLIPTA 62.5-25 MCG/INH AEPB   6  .  ARNUITY ELLIPTA 100 MCG/ACT AEPB   6  . aspirin 325 MG tablet Take 325 mg by mouth daily.    . betamethasone dipropionate (DIPROLENE) 0.05 % cream Apply topically 2 (two) times daily.    . calcium citrate-vitamin D (CITRACAL+D) 315-200 MG-UNIT per tablet Take 1 tablet by mouth 2 (two) times daily. 750 mg    . carbidopa-levodopa (SINEMET IR) 10-100 MG tablet TAKE 1 TABLET BY MOUTH 3 TIMES DAILY. 90 tablet 4  . Cholecalciferol (VITAMIN D3) 2000 UNITS TABS Take 1 tablet by mouth daily.    [name] COLCRYS 0.6 MG tablet Take 0.6 mg by mouth daily.    . fish oil-omega-3 fatty acids 1000 MG capsule Take 2 g by mouth daily.    [name] HYDROcodone-acetaminophen (NORCO/VICODIN) 5-325 MG per tablet Take 1-2 tablets by mouth every 4 (four) hours as needed.     [name] levothyroxine (SYNTHROID, LEVOTHROID) 175 MCG tablet Take 175 mcg by mouth daily before breakfast. 2 tablets    . losartan-hydrochlorothiazide (HYZAAR) 100-25 MG per tablet Take 1 tablet by mouth daily.    . metformin (FORTAMET) 500 MG (OSM) 24 hr tablet Take 500 mg by mouth 2 (two) times daily with a meal.     . metoprolol tartrate (LOPRESSOR) 25 MG tablet Take 25 mg by mouth 3 (three) times daily.    . Multiple Vitamin (MULTIVITAMIN) tablet Take 1 tablet by mouth daily.    [name] oxymetazoline (AFRIN) 0.05 % nasal spray Place 2 sprays into the nose 2 (two) times daily.    . ranitidine (ZANTAC) 150 MG tablet Take 150 mg by mouth 2 (two) times daily.    . rizatriptan (MAXALT) 10 MG tablet Take 10 mg by  mouth as needed for migraine. May repeat in 2 hours if needed    . rOPINIRole (REQUIP) 1 MG tablet TAKE 1 TABLET BY MOUTH IN THE MORNING AND 4 TABLETS AT BEDTIME. 150 tablet 5  . simvastatin (ZOCOR) 20 MG tablet Take 20 mg by mouth every evening.    . TRAVATAN Z 0.004 % SOLN ophthalmic solution Place 1 drop into both eyes.     . VENTOLIN HFA 108 (90 Base) MCG/ACT inhaler   6   No facility-administered medications prior to visit.    PAST MEDICAL HISTORY: Past Medical History  Diagnosis Date  . Hypertension   . GERD (gastroesophageal reflux disease)   . Barrett esophagus   . Chronic kidney disease   . OSA (obstructive sleep apnea)   . Restless leg   . CAD (coronary artery disease)   . Diabetes mellitus without complication ([place])   . Ulcer     Peptic ulcer disease  . COPD (chronic obstructive pulmonary disease) ([place])   . Carotid artery occlusion   . Cancer ([place])     thyroid  . Thyroid disease   . History of thyroid cancer 2008  . History of thyroid cancer   . Sleep apnea with use of continuous positive airway pressure (CPAP)     2011 [facility] , AHI  77cn central and obstrcutive. 16 cm water , 3 cm EPR.     PAST SURGICAL HISTORY: Past Surgical History  Procedure Laterality Date  . Carotid endarterectomy Left January 03, 2006    Cesar. Amedeo Plenty  . Joint replacement Left     knee X 2  . Thyroidectomy  2008    FAMILY HISTORY: Family History  Problem Relation Age of Onset  . COPD Mother   . Hyperlipidemia Mother   .  Hypertension Mother   . Diabetes Father   . Kidney disease Father     ESRD  . Hypertension Father   . Cancer Father   . Hyperlipidemia Father     SOCIAL HISTORY: Social History   Social History  . Marital Status: Married    Spouse Name: [name]  . Number of Children: 2  . Years of Education: COLLEGE   Occupational History  .     Social History Main Topics  . Smoking status: Former Smoker    Quit date: 06/19/2005  . Smokeless tobacco: Never Used    . Alcohol Use: No  . Drug Use: No  . Sexual Activity: Not on file   Other Topics Concern  . Not on file   Social History Narrative   Patient is married [name]) and lives at home with his wife.   Patient has two children.   Patient is retired.   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   Patient is right-handed.   Patient drinks three cups of coffee per day.      PHYSICAL EXAM  Filed Vitals:   12/29/15 1400  Height: 5\' 6"  (1.676 m)  Weight: 241 lb (109.317 kg)   Body mass index is 38.92 kg/(m^2).  Generalized: Well developed, in no acute distress  Neck: Circumference 17-1/2  Neurological examination  Mentation: Alert oriented to time, place, history taking. Follows all commands speech and language fluent Cranial nerve II-XII: Pupils were equal round reactive to light. Extraocular movements were full, visual field were full on confrontational test. Facial sensation and strength were normal. Uvula tongue midline. Head turning and shoulder shrug  were normal and symmetric. Motor: The motor testing reveals 5 over 5 strength of all 4 extremities. Good symmetric motor tone is noted throughout.  Sensory: Sensory testing is intact to soft touch on all 4 extremities. No evidence of extinction is noted.  Coordination: Cerebellar testing reveals good finger-nose-finger and heel-to-shin bilaterally.  Gait and station: Gait is normal. Reflexes: Deep tendon reflexes are symmetric and normal bilaterally.   DIAGNOSTIC DATA (LABS, IMAGING, TESTING) - I reviewed patient records, labs, notes, testing and imaging myself where available.    ASSESSMENT AND PLAN 75 y.o. year old male  has a past medical history of Hypertension; GERD (gastroesophageal reflux disease); Barrett esophagus; Chronic kidney disease; OSA (obstructive sleep apnea); Restless leg; CAD (coronary artery disease); Diabetes mellitus without complication ([place]); Ulcer; COPD (chronic obstructive pulmonary  disease) ([place]); Carotid artery occlusion; Cancer ([place]); Thyroid disease; History of thyroid cancer (2008); History of thyroid cancer; and Sleep apnea with use of continuous positive airway pressure (CPAP). here with:  1. Obstructive sleep apnea on CPAP  Overall the patient is doing well but he is yet to receive a new mask. We will contact his DME company today to see if he qualifies for a new mask- refitted at the last visit. A prescription was sent at the last visit. Patient will follow-up in 4-5 months or sooner if needed.    Ward Givens, MSN, NP-C 12/29/2015, 2:07 PM [facility] [address] [place], [place] [address] [phone]

## 2015-12-29 NOTE — Patient Instructions (Signed)
Call us if you do not hear from [facility] If your symptoms worsen or you develop new symptoms please let us know.

## 2016-01-25 ENCOUNTER — Encounter: Payer: Self-pay | Admitting: *Deleted

## 2016-04-30 ENCOUNTER — Encounter: Payer: Self-pay | Admitting: Neurology

## 2016-05-01 ENCOUNTER — Ambulatory Visit (INDEPENDENT_AMBULATORY_CARE_PROVIDER_SITE_OTHER): Payer: Medicare Other | Admitting: Adult Health

## 2016-05-01 ENCOUNTER — Encounter: Payer: Self-pay | Admitting: Adult Health

## 2016-05-01 VITALS — BP 150/80 | HR 78 | Ht 66.0 in | Wt 244.8 lb

## 2016-05-01 DIAGNOSIS — G4733 Obstructive sleep apnea (adult) (pediatric): Secondary | ICD-10-CM

## 2016-05-01 DIAGNOSIS — Z9989 Dependence on other enabling machines and devices: Secondary | ICD-10-CM

## 2016-05-01 NOTE — Patient Instructions (Signed)
Continue using CPAP  If your symptoms worsen or you develop new symptoms please let us know.

## 2016-05-01 NOTE — Progress Notes (Signed)
I agree with the assessment and plan as directed by NP .The patient is known to me .   Sudie Bandel, MD  

## 2016-05-01 NOTE — Progress Notes (Signed)
PATIENT: [name] DOB: March 07, 1941  REASON FOR VISIT: follow up HISTORY FROM: patient  HISTORY OF PRESENT ILLNESS: [name] is a 75 year old male with a history of obstructive sleep apnea on CPAP. He returns today for follow-up. He is download indicates that he uses machine 30 out of 30 days for compliance of 100%. Initially he uses machine greater than 4 hours. On average he uses his machine for 5 hours and 32 minutes. His residual AHI is 8.3 on a minimum pressure of 12 cm water and maximum pressure of 18 cm water. He does have a leak in the 95th percentile at 38.9 L/m. The patient has a full face mask. In the past we have had his mask refitted. He states that he recently had new supplies sent to him. On his download it does show that in the last 2 weeks his leak has improved. He states that he's tried using nasal pillows but he has nasal stuffiness and is unable to tolerate these mask. He returns today for an evaluation.  HISTORY 12/29/15: [name] is a 75 year old male with a history of obstructive sleep apnea on CPAP. He also has restless leg syndrome. He returns today for an evaluation. At the last visit he was refitted for a new mask however he states that he was unable to get a new mask because of insurance? His download today indicates that he uses his machine 30 out of 30 days for compliance of 100%. He uses his machine 28 out of 30 days greater than 4 hours. Residual AHI of 7.9 on a minimum pressure of 12 c m water and maximum pressure of 18 cm water with EPR 1. He continues to have a significant leak and the 95th percentile at 38.2 L/m. The patient continues on Requip and Sinemet for restless leg. He states that if he can get to sleep he doesn't have any problems with his restless leg. He returns today for an evaluation.  HISTORY 10/05/15: [name] is a 75 year old male with a history of obstructive sleep apnea on CPAP and restless leg syndrome. He returns today for an evaluation. His  download indicates that he uses machine 30 out of 30 days for compliance of 100%. He uses machine greater than 4 hours 30 out of 30 days for compliance of 100%. On average he uses his machine 5 hours and 52 minutes. His minimum pressure is 12 cm of water and maximum pressure of 18 cm of water with EPR of 1. His residual AHI is 9.7. He does have a significant leak in the 95th percentile at 29.1 L/m. The patient states that he has tried nasal mask in the past but was unable to tolerate. He states he does change out his mask and straps regularly. He feels that his restless legs symptoms are under relatively good control with Sinemet and Requip. He states that he does have bad days. He denies any new neurological symptoms. He returns today for an evaluation.  HISTORY [facility]): [name] is a 75 y.o. male Is seen here as a revisit from Dr. [name] for CPAP compliance every 12 month .  The patient has been long time established, and underwent a split study in 2011 at [place]. His AHI was 77 per hour and he was titrated to 16 cm water pressure with a 3 cm EPR. The residual AHI became lower than 5. A download reorder from August 2013 showed 100% compliance. I was unable today to obtain the  data for 2014 but asked [name] 'es DME - [facility] here in [place]) to provide [place] with the most recent data. He got this download per [name] today.  Today the download shows been 96% compliance over the last 90 days, average time of CPAP use per day at 6 hours and 52 minutes, residual AHI is 6.9 and the sac pressure is 16 cm water with 3 cm EPR there is a moderately high air leak, the pressure is so high his mask blows off his face. He needs a new mask and headgear soon.  I would like for him to have an auto-titrator machine with a setting from 8 through 16 cm water . He states he never sleeps without his CPAP and likes the machines effect on his sleep, rest, agility.  He has 2- 3 nocturia breaks interrupting  his sleep. Bedtime is between 23 hours and midnight. Subjective getting 7-8 hours of sleep , wakes up spontaneously at 7 AM. Breakfast with caffeine , 2-3 cups of coffee.   RV note; 02-18-13  He has been in the past 100% compliant-  He has increasingly problems with GOUT and had an attack 3 days ago.  3 weeks ago he developed a severe pain in shoulder and elbow on the right , His feet started hurting bad, uric acid was high. When [name] initiated Colcrys, he got immediately better. He reports getting short of breath easily, he has not been able to lose weight. Dr. [name] initiated diabetes Medication to reduce glucosuria.   Interval history 08-25-14   [name] has been doing well with his gout and his gout pain has been no longer evident.  He continues to use his CPAP with high compliance . I was able to review a download that shows a 30 day 100% compliance for days of use , 97% compliance for 4 hours or more. The patient used to machine on average 6 hours and 58 minutes daily the machine is set at 16 cm water pressure with a deep ER of 3 cm. The residual AHI however was higher this time at 9.5 and air leak is not to be blamed for the higher apnea hypopnea index. He is in need of a new insert he reports and this may be a fitting issue for the mask. I also would like to set his machine to an outer titration perhaps between 12 and 18 cm. He feels comfortable with using his machine and he reports falling asleep promptly and sleeping on average 7 hours at night. Her sleepiness score today was 11 points and his fatigue severity score 19 points. I reviewed his medication list. He reports that his restless legs however drives him crazy some days he is on 1 mg of Requip up to 4 times a day and he takes carbidopa levodopa 1 tablet at bedtime. The patient takes 1 mg Requip Tabs, 4 tablets together at 3 PM and one in the morning. i will change this ti o a 24 XR prescription.  Revisit note from  07/01/2015. I me today with [name], who has an excellent compliance on his CPAP S 9 machine. We interrogated the machine by wife 5 today here in the office and it revealed that his AHI is 6.7 with 100% compliance for days of use and an average user time of 6 hours and 6 minutes. He is using an AutoSet between 12 and 18 cm water pressure was 1 cm EPR the 95th percentile pressure is 16.  He does have sometimes significant air leaks that the mask leak he is using a full face mask as evident by the pressure marks at least on his face.he cannot tolerate nasal airflow.  He has bronchitic breathing and has been short of air while talking.  His gout remains well controlled, but her mentioned how expensive his medications are , needed to achieve this control.  He reports restless legs and filled the Johns-Hopkins Questionnaire for quality of life in RLS.   REVIEW OF SYSTEMS: Out of a complete 14 system review of symptoms, the patient complains only of the following symptoms, and all other reviewed systems are negative.  See history of present illness  ALLERGIES: No Known Allergies  HOME MEDICATIONS: Outpatient Medications Prior to Visit  Medication Sig Dispense Refill  . allopurinol (ZYLOPRIM) 300 MG tablet   12  . ANORO ELLIPTA 62.5-25 MCG/INH AEPB   6  . ARNUITY ELLIPTA 100 MCG/ACT AEPB   6  . aspirin 325 MG tablet Take 325 mg by mouth daily.    . betamethasone dipropionate (DIPROLENE) 0.05 % cream Apply topically 2 (two) times daily.    . calcium citrate-vitamin D (CITRACAL+D) 315-200 MG-UNIT per tablet Take 1 tablet by mouth 2 (two) times daily. 750 mg    . carbidopa-levodopa (SINEMET IR) 10-100 MG tablet TAKE 1 TABLET BY MOUTH 3 TIMES DAILY. 90 tablet 4  . Cholecalciferol (VITAMIN D3) 2000 UNITS TABS Take 1 tablet by mouth daily.    [name] COLCRYS 0.6 MG tablet Take 0.6 mg by mouth daily.    . fish oil-omega-3 fatty acids 1000 MG capsule Take 3 g by mouth daily.     [name]  HYDROcodone-acetaminophen (NORCO/VICODIN) 5-325 MG per tablet Take 1-2 tablets by mouth every 4 (four) hours as needed.     [name] levothyroxine (SYNTHROID, LEVOTHROID) 175 MCG tablet Take 175 mcg by mouth daily before breakfast. 2 tablets    . losartan-hydrochlorothiazide (HYZAAR) 100-25 MG per tablet Take 1 tablet by mouth daily.    . metformin (FORTAMET) 500 MG (OSM) 24 hr tablet Take 500 mg by mouth 2 (two) times daily with a meal.     . metoprolol tartrate (LOPRESSOR) 25 MG tablet Take 25 mg by mouth 3 (three) times daily.    . Multiple Vitamin (MULTIVITAMIN) tablet Take 1 tablet by mouth daily.    [name] oxymetazoline (AFRIN) 0.05 % nasal spray Place 2 sprays into the nose 2 (two) times daily.    . ranitidine (ZANTAC) 150 MG tablet Take 150 mg by mouth 2 (two) times daily.    . rizatriptan (MAXALT) 10 MG tablet Take 10 mg by mouth as needed for migraine. May repeat in 2 hours if needed    . rOPINIRole (REQUIP) 1 MG tablet TAKE 1 TABLET BY MOUTH IN THE MORNING AND 4 TABLETS AT BEDTIME. 150 tablet 5  . simvastatin (ZOCOR) 20 MG tablet Take 20 mg by mouth every evening.    . TRAVATAN Z 0.004 % SOLN ophthalmic solution Place 1 drop into both eyes.     . VENTOLIN HFA 108 (90 Base) MCG/ACT inhaler   6   No facility-administered medications prior to visit.     PAST MEDICAL HISTORY: Past Medical History:  Diagnosis Date  . Barrett esophagus   . CAD (coronary artery disease)   . Cancer ([place])    thyroid  . Carotid artery occlusion   . Chronic kidney disease   . COPD (chronic obstructive pulmonary disease) ([place])   . Diabetes mellitus  without complication ([place])   . GERD (gastroesophageal reflux disease)   . History of thyroid cancer 2008  . History of thyroid cancer   . Hypertension   . OSA (obstructive sleep apnea)   . Restless leg   . Sleep apnea with use of continuous positive airway pressure (CPAP)    2011 [facility] , AHI  77cn central and obstrcutive. 16 cm water , 3 cm EPR.   . Thyroid  disease   . Ulcer ([place])    Peptic ulcer disease    PAST SURGICAL HISTORY: Past Surgical History:  Procedure Laterality Date  . CAROTID ENDARTERECTOMY Left January 03, 2006   Dr. Amedeo Plenty  . JOINT REPLACEMENT Left    knee X 2  . THYROIDECTOMY  2008    FAMILY HISTORY: Family History  Problem Relation Age of Onset  . COPD Mother   . Hyperlipidemia Mother   . Hypertension Mother   . Diabetes Father   . Kidney disease Father     ESRD  . Hypertension Father   . Cancer Father   . Hyperlipidemia Father     SOCIAL HISTORY: Social History   Social History  . Marital status: Married    Spouse name: [name]  . Number of children: 2  . Years of education: COLLEGE   Occupational History  .  Retired   Social History Main Topics  . Smoking status: Former Smoker    Quit date: 06/19/2005  . Smokeless tobacco: Never Used  . Alcohol use No  . Drug use: No  . Sexual activity: Not on file   Other Topics Concern  . Not on file   Social History Narrative   Patient is married [name]) and lives at home with his wife.   Patient has two children.   Patient is retired.   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   Patient is right-handed.   Patient drinks three cups of coffee per day.      PHYSICAL EXAM  Vitals:   05/01/16 1401  BP: (!) 150/80  Pulse: 78  SpO2: 96%  Weight: 244 lb 12.8 oz (111 kg)  Height: 5\' 6"  (1.676 m)   Body mass index is 39.51 kg/m.  Generalized: Well developed, in no acute distress  Neck: Circumference 18 inches, Mallampati 4+  Neurological examination  Mentation: Alert oriented to time, place, history taking. Follows all commands speech and language fluent Cranial nerve II-XII: Pupils were equal round reactive to light. Extraocular movements were full, visual field were full on confrontational test. Facial sensation and strength were normal. Uvula tongue midline. Head turning and shoulder shrug  were normal and symmetric. Motor:  The motor testing reveals 5 over 5 strength of all 4 extremities. Good symmetric motor tone is noted throughout.  Sensory: Sensory testing is intact to soft touch on all 4 extremities. No evidence of extinction is noted.  Coordination: Cerebellar testing reveals good finger-nose-finger and heel-to-shin bilaterally.  Gait and station: Gait is normal. Tandem gait is normal. Romberg is negative. No drift is seen.  Reflexes: Deep tendon reflexes are symmetric and normal bilaterally.   DIAGNOSTIC DATA (LABS, IMAGING, TESTING) - I reviewed patient records, labs, notes, testing and imaging myself where available.      ASSESSMENT AND PLAN 75 y.o. year old male  has a past medical history of Barrett esophagus; CAD (coronary artery disease); Cancer ([place]); Carotid artery occlusion; Chronic kidney disease; COPD (chronic obstructive pulmonary disease) ([place]); Diabetes mellitus without  complication ([place]); GERD (gastroesophageal reflux disease); History of thyroid cancer (2008); History of thyroid cancer; Hypertension; OSA (obstructive sleep apnea); Restless leg; Sleep apnea with use of continuous positive airway pressure (CPAP); Thyroid disease; and Ulcer ([place]). here with:  1. Obstructive sleep apnea on CPAP  The patient's download shows excellent compliance. His residual AHI is elevated however this has been consistent. The patient does have a slight leak but has tried multiple masks in the past. For now we will continue to monitor. Patient advised that if his symptoms worsen or he develops new symptoms he should let us know. He will follow-up in 6 months or sooner if needed.     Ward Givens, MSN, NP-C 05/01/2016, 2:21 PM [facility] [address], [place], [place] [address] [phone]

## 2016-10-30 ENCOUNTER — Encounter: Payer: Self-pay | Admitting: Neurology

## 2016-10-30 ENCOUNTER — Ambulatory Visit (INDEPENDENT_AMBULATORY_CARE_PROVIDER_SITE_OTHER): Payer: Medicare Other | Admitting: Neurology

## 2016-10-30 VITALS — BP 135/81 | HR 68 | Ht 66.0 in | Wt 225.0 lb

## 2016-10-30 DIAGNOSIS — G4710 Hypersomnia, unspecified: Secondary | ICD-10-CM

## 2016-10-30 DIAGNOSIS — G4731 Primary central sleep apnea: Secondary | ICD-10-CM | POA: Insufficient documentation

## 2016-10-30 DIAGNOSIS — R062 Wheezing: Secondary | ICD-10-CM | POA: Diagnosis not present

## 2016-10-30 DIAGNOSIS — G4730 Sleep apnea, unspecified: Secondary | ICD-10-CM

## 2016-10-30 MED ORDER — ROPINIROLE HCL 1 MG PO TABS: ORAL_TABLET | ORAL | 5 refills | Status: DC

## 2016-10-30 MED ORDER — CARBIDOPA-LEVODOPA 10-100 MG PO TABS: 1.0000 | ORAL_TABLET | Freq: Two times a day (BID) | ORAL | 4 refills | Status: DC

## 2016-10-30 NOTE — Addendum Note (Signed)
Addended by: Larey Seat on: 10/30/2016 02:11 PM   Modules accepted: Orders

## 2016-10-30 NOTE — Progress Notes (Signed)
[facility]   Provider:  Larey Seat, M D  Referring Provider: [name], MD Primary Care Physician:  [name], MD  Chief Complaint  Patient presents with  . Follow-up    cpap    HPI:  [name] is a 76 y.o. male  Is seen here as a  revisit  from Dr. [name] for CPAP compliance every 12 month .      10-30-2016, [name] has been, as usual, and excellent compliant patient. He has used CPAP was 90% compliance and an average of 5 hours and 90 minutes daily, uses an AutoSet between 12 and 18 cm water was 1 cm EPR and hasn't residual apnea index of 5.8. This is a little higher than I like it's but I think it is related to higher air leaks. The 95th percentile pressure was 17.1, which is an unusual high pressure needs. The air leaks were remarkably high. The patient sometimes is aware at night that there is an air leak but not every night I would like for him to be refitted to a different interface. I would like him to try an air touch fullface mask, which has no resting point on the forehead. He also endorsed 16 points on his Epworth Sleepiness Scale which is too high, and 43 points on the fatigue. His geriatric depression score on the other hand was only endorsed at one out of 15 points. He continues to use multiple medications including ropinirole 1 mg in the morning and 4 mg at night for restless leg control. Carbidopa levodopa2 times a day for RLS, he also takes metoprolol levothyroxin rizatriptan and allopurinol. Various over-the-counter medication-nutritional supplements. RLS goes away after sinemet intake. 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  Revisit note from 07/01/2015. I me today with [name], who has an excellent compliance on his CPAP  S 9 machine. We interrogated the machine by wife 5 today here in the office and it revealed that his AHI is 6.7 with 100% compliance for days of use and an average user  time of 6 hours and 6 minutes. He is using an AutoSet between 12 and 18 cm water pressure was 1 cm EPR the 95th percentile pressure is 16. He does have sometimes significant air leaks that the mask leak he is using a full face mask as evident by the pressure marks at least on his face.he cannot tolerate nasal airflow.   He has bronchitic breathing and has been short of air while talking.  His gout remains well controlled, but her mentioned how expensive his medications are , needed to achieve this control.  He reports restless legs and filled the Johns-Hopkins Questionnaire for quality of life in RLS.  The patient has been long time established, and  underwent a split study in 2011 at [facility]. His AHI was 77 per hour and he was titrated to 16 cm water pressure with a 3 cm EPR. The residual AHI became lower than 5. A download reorder from August 2013 showed 100% compliance. I was unable today to obtain the data for 2014 but  asked Mr. Matlock 'es DME - [facility] here in [place]) to provide [place] with the most recent data. He got this download per [name] today. Today the download shows been 96% compliance over the last 90 days, average time of CPAP use per day at 6 hours and 52 minutes, residual AHI is 6.9 and the sac pressure is  16 cm water with 3 cm EPR there is a moderately high air leak, the pressure is so high his mask blows off his face. He needs a new mask and headgear soon.  I would like for him to have an auto-titrator machine with a setting from 8 through 16 cm water . He states he never sleeps without his CPAP and likes the machines effect on his sleep, rest, agility.  He has 2- 3 nocturia breaks interrupting his sleep. Bedtime is between 23 hours and midnight. Subjective getting 7-8 hours of sleep , wakes up spontaneously at 7 AM.         Review of Systems: Out of a complete 14 system review, the patient complains of only the following symptoms, and all other reviewed systems are negative.   SOB, fatigue reduced. GDS one points,  How likely are you to doze in the following situations: 0 = not likely, 1 = slight chance, 2 = moderate chance, 3 = high chance  Sitting and Reading? 2 Watching Television?3 Sitting inactive in a public place (theater or meeting)?2 Lying down in the afternoon when circumstances permit?2 Sitting and talking to someone?1 Sitting quietly after lunch without alcohol?3 In a car, while stopped for a few minutes in traffic?1 As a passenger in a car for an hour without a break?2  Total =17    Epworth 7 points, FSS 32 points, shoulder pain keeps him from sleeping.    He adjusted his diet , his wife no longer cooks fried foods, more whole wheat , salads.  His son is a Child psychotherapist and supplies the couple with whole wheat .   Breakfast with caffeine , 2-3 cups of coffee.     Social History   Social History  . Marital status: Married    Spouse name: [name]  . Number of children: 2  . Years of education: COLLEGE   Occupational History  .  Retired   Social History Main Topics  . Smoking status: Former Smoker    Quit date: 06/19/2005  . Smokeless tobacco: Never Used  . Alcohol use No  . Drug use: No  . Sexual activity: Not on file   Other Topics Concern  . Not on file   Social History Narrative   Patient is married [name]) and lives at home with his wife.   Patient has two children.   Patient is retired.   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   Patient is right-handed.   Patient drinks three cups of coffee per day.    Family History  Problem Relation Age of Onset  . COPD Mother   . Hyperlipidemia Mother   . Hypertension Mother   . Diabetes Father   . Kidney disease Father        ESRD  . Hypertension Father   . Cancer Father   . Hyperlipidemia Father     Past Medical History:  Diagnosis Date  . Barrett esophagus   . CAD (coronary artery disease)   . Cancer ([place])    thyroid  . Carotid artery occlusion   .  Chronic kidney disease   . COPD (chronic obstructive pulmonary disease) ([place])   . Diabetes mellitus without complication ([place])   . GERD (gastroesophageal reflux disease)   . History of thyroid cancer 2008  . History of thyroid cancer   . Hypertension   . OSA (obstructive sleep apnea)   . Restless leg   . Sleep apnea with use  of continuous positive airway pressure (CPAP)    2011 [facility] , AHI  77cn central and obstrcutive. 16 cm water , 3 cm EPR.   . Thyroid disease   . Ulcer    Peptic ulcer disease    Past Surgical History:  Procedure Laterality Date  . CAROTID ENDARTERECTOMY Left January 03, 2006   Dr. Amedeo Plenty  . JOINT REPLACEMENT Left    knee X 2  . THYROIDECTOMY  2008    Current Outpatient Prescriptions  Medication Sig Dispense Refill  . allopurinol (ZYLOPRIM) 300 MG tablet   12  . ANORO ELLIPTA 62.5-25 MCG/INH AEPB   6  . ARNUITY ELLIPTA 100 MCG/ACT AEPB   6  . aspirin 325 MG tablet Take 325 mg by mouth daily.    . betamethasone dipropionate (DIPROLENE) 0.05 % cream Apply topically 2 (two) times daily.    . calcium citrate-vitamin D (CITRACAL+D) 315-200 MG-UNIT per tablet Take 1 tablet by mouth 2 (two) times daily. 750 mg    . carbidopa-levodopa (SINEMET IR) 10-100 MG tablet TAKE 1 TABLET BY MOUTH 3 TIMES DAILY. 90 tablet 4  . Cholecalciferol (VITAMIN D3) 2000 UNITS TABS Take 1 tablet by mouth daily.    . cloNIDine (CATAPRES) 0.1 MG tablet Take 0.1 mg by mouth 2 (two) times daily.  8  . COLCRYS 0.6 MG tablet Take 0.6 mg by mouth daily.    . fish oil-omega-3 fatty acids 1000 MG capsule Take 3 g by mouth daily.     [name] gabapentin (NEURONTIN) 300 MG capsule Take 300 mg by mouth 2 (two) times daily.  4  . HYDROcodone-acetaminophen (NORCO/VICODIN) 5-325 MG per tablet Take 1-2 tablets by mouth every 4 (four) hours as needed.     [name] levothyroxine (SYNTHROID, LEVOTHROID) 175 MCG tablet Take 175 mcg by mouth daily before breakfast. 2 tablets    . losartan-hydrochlorothiazide  (HYZAAR) 100-25 MG per tablet Take 1 tablet by mouth daily.    . metformin (FORTAMET) 500 MG (OSM) 24 hr tablet Take 500 mg by mouth 2 (two) times daily with a meal.     . metoprolol tartrate (LOPRESSOR) 25 MG tablet Take 25 mg by mouth 3 (three) times daily.    . Multiple Vitamin (MULTIVITAMIN) tablet Take 1 tablet by mouth daily.    [name] oxymetazoline (AFRIN) 0.05 % nasal spray Place 2 sprays into the nose 2 (two) times daily.    . ranitidine (ZANTAC) 150 MG tablet Take 150 mg by mouth 2 (two) times daily.    . rizatriptan (MAXALT) 10 MG tablet Take 10 mg by mouth as needed for migraine. May repeat in 2 hours if needed    . rOPINIRole (REQUIP) 1 MG tablet TAKE 1 TABLET BY MOUTH IN THE MORNING AND 4 TABLETS AT BEDTIME. 150 tablet 5  . simvastatin (ZOCOR) 20 MG tablet Take 20 mg by mouth every evening.    [name] SPIRIVA HANDIHALER 18 MCG inhalation capsule Take 1 puff by mouth daily.  0  . TRAVATAN Z 0.004 % SOLN ophthalmic solution Place 1 drop into both eyes.     . VENTOLIN HFA 108 (90 Base) MCG/ACT inhaler   6   No current facility-administered medications for this visit.     Allergies as of 10/30/2016  . (No Known Allergies)    Vitals: BP 135/81   Pulse 68   Ht 5\' 6"  (1.676 m)   Wt 225 lb (102.1 kg)   BMI 36.32 kg/m  Last Weight:  Wt Readings from  Last 1 Encounters:  10/30/16 225 lb (102.1 kg)   Last Height:   Ht Readings from Last 1 Encounters:  10/30/16 5\' 6"  (1.676 m)    Physical exam:  General: The patient is awake, alert and appears not in acute distress. The patient is well groomed. Head: Normocephalic, atraumatic. Neck is supple. Mallampati 1 wide open- very red upper airway( GERD )with a  neck circumference of  20 inches ,  no nasal deviation, but chronic tendency to have nasal airflow obstruction-rhinitis. No TMJ . Tonsillectomy at age 29 .  Cardiovascular:  Regular rate and rhythm ,  Skin:  Ankle edema, no rash. Facial pressure marks from full facemask. Trunk: BMI is  elevated.  Neurologic exam : The patient is awake and alert, oriented to place and time.  Memory subjective  described as intact. There is a normal attention span & concentration ability. Speech is fluent without dysarthria, but dysphonia or aphasia. Mood and affect are appropriate.  Cranial nerves: Pupils are equal and briskly reactive to light. Hearing to finger rub intact. Facial sensation intact to fine touch.  Facial motor strength is symmetric, tongue and uvula move midline.  Motor exam:  No cogwheeling , no tremor.   Deep tendon reflexes: in the  upper and lower extremities are symmetric and intact. Babinski maneuver response is  downgoing.   Assessment:  Visit furation 25 minutes with Epworth, CPAP compliance and depression and RLS,  Epworth and fatigue questionnaire elevated   Interrogation of CPAP- auto setting. 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   High air leaks, full face mask is the only tolerated mask however. In office and at [facility] today , CPAP download obtained. AHi is  7.8  and compliance for 90 days at 90% .  I will change him to airtouch  F F M.    BMI morbidly obese, SOB ,  wheezing, "wet sounding " lung, audible rhonchi .   Plan:    CSA/ OSA :  Keep on auto CPAP. Change mask due to high air leaks.  Lose weight BMI remains main risk factor.   Wheezing ; He has a very wet sounding lung, He is diaphoretic, and short of breath ! refer to PCP. DR [name].  Remains on auto titration.   Neuropathy / RLS ; Patient is diabetic and has glucosuria and gout . Neuropathy Has RLS - obtained the RLS [employer].  Exercise ; none.     Rv in 6 month with Ward Givens , NP   Cc Dr [name].

## 2016-11-28 ENCOUNTER — Encounter: Payer: Self-pay | Admitting: Pulmonary Disease

## 2016-11-28 ENCOUNTER — Ambulatory Visit (INDEPENDENT_AMBULATORY_CARE_PROVIDER_SITE_OTHER): Payer: Medicare Other | Admitting: Pulmonary Disease

## 2016-11-28 ENCOUNTER — Ambulatory Visit (INDEPENDENT_AMBULATORY_CARE_PROVIDER_SITE_OTHER)
Admission: RE | Admit: 2016-11-28 | Discharge: 2016-11-28 | Disposition: A | Discharge status: Home / Self Care | Payer: Medicare Other | Source: Ambulatory Visit | Attending: Pulmonary Disease | Admitting: Pulmonary Disease

## 2016-11-28 VITALS — BP 136/74 | HR 61 | Ht 66.0 in | Wt 229.0 lb

## 2016-11-28 DIAGNOSIS — R059 Cough, unspecified: Secondary | ICD-10-CM

## 2016-11-28 DIAGNOSIS — J449 Chronic obstructive pulmonary disease, unspecified: Secondary | ICD-10-CM | POA: Diagnosis not present

## 2016-11-28 DIAGNOSIS — J302 Other seasonal allergic rhinitis: Secondary | ICD-10-CM

## 2016-11-28 DIAGNOSIS — R0600 Dyspnea, unspecified: Secondary | ICD-10-CM

## 2016-11-28 DIAGNOSIS — K219 Gastro-esophageal reflux disease without esophagitis: Secondary | ICD-10-CM | POA: Diagnosis not present

## 2016-11-28 DIAGNOSIS — R05 Cough: Secondary | ICD-10-CM | POA: Diagnosis not present

## 2016-11-28 IMAGING — DX DG CHEST 2V
2 series · 2 of 2 positions shown · non-contrast
Comparison: [DATE]

CLINICAL DATA: Chronic dyspnea and cough

EXAM:
CHEST  2 VIEW

[chest pa]
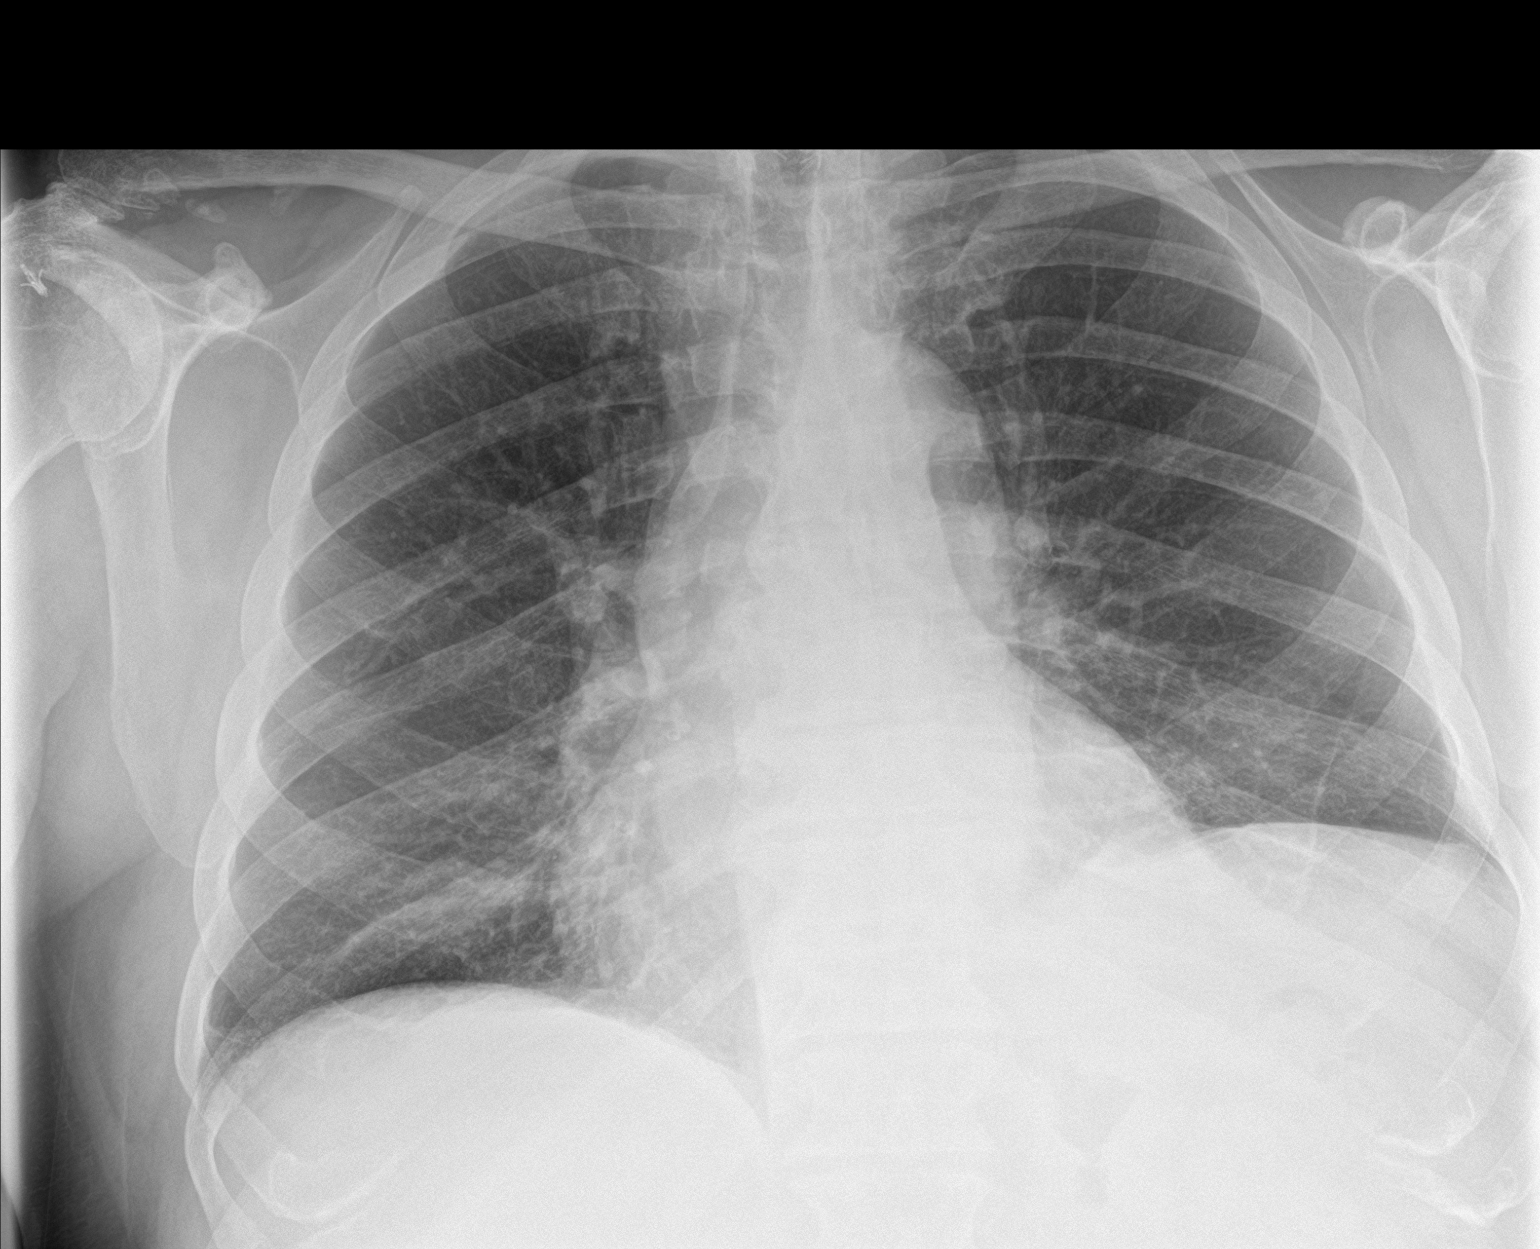

[chest lat]
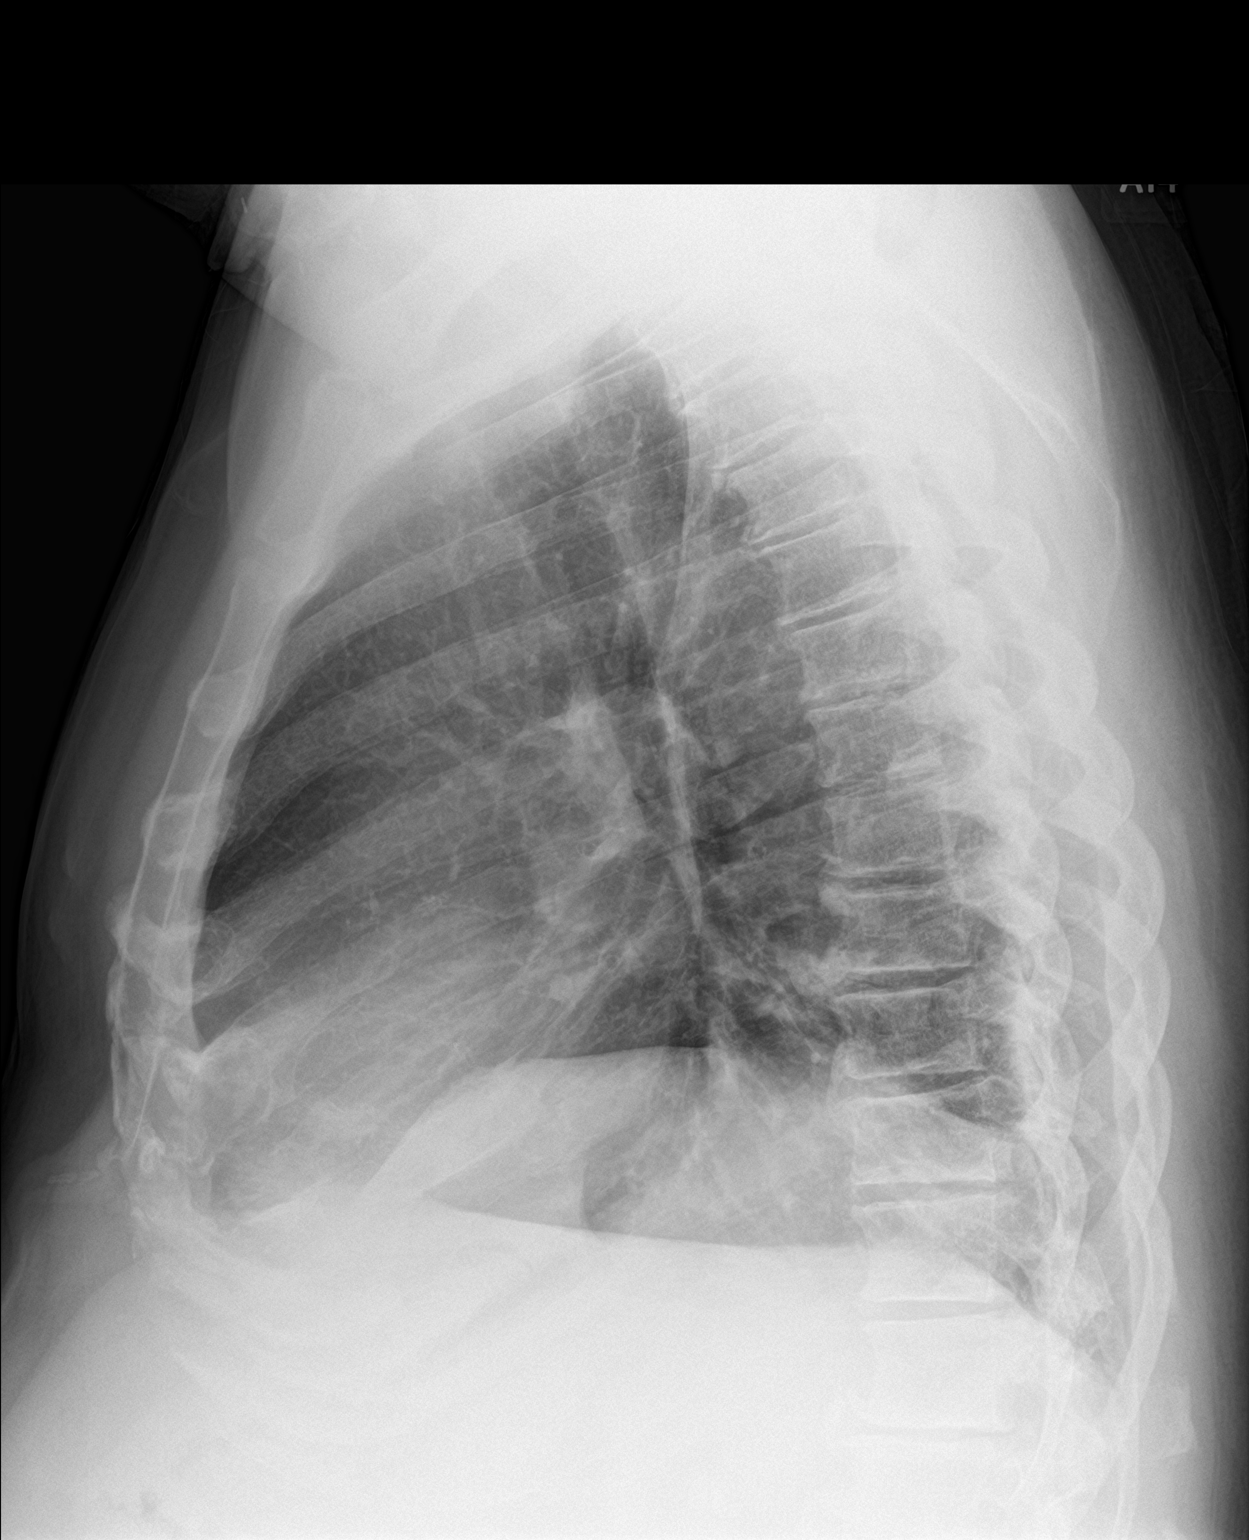

[2 of 2 positions shown; findings below may reference images not displayed]

FINDINGS: Cardiac shadow is mildly enlarged but stable. The lungs are well
aerated bilaterally. Elevation of left hemidiaphragm is again seen.
No focal infiltrate or sizable effusion is noted. Degenerative
changes of the thoracic spine and right shoulder joint are seen.
Postsurgical changes at the thoracic inlet are noted.
IMPRESSION: No acute abnormality seen.

## 2016-11-28 MED ORDER — FLUTTER DEVI: 0 refills | Status: DC

## 2016-11-28 NOTE — Addendum Note (Signed)
Addended by: Len Blalock on: 11/28/2016 11:02 AM   Modules accepted: Orders

## 2016-11-28 NOTE — Patient Instructions (Signed)
For your COPD: We will check a lung function tests today Keep taking Anoro as you are doing  For your gastroesophageal reflux disease: Keep taking Zantac twice a day Follow the gastroesophageal reflux disease lifestyle modification sheet we gave you today If no improvement in cough or heartburn symptoms by the next visit then we will change her to something called a proton pump inhibitor  For your cough: I believe this is due to acid reflux and allergic rhinitis, follow the instructions above Use the the flutter valve 4-5 breaths 4-5 times per day  For your allergic rhinitis: Use Neil Med rinses with distilled water at least twice per day using the instructions on the package. 1/2 hour after using the [facility] Med rinse, use Nasacort two puffs in each nostril once per day.  Remember that the Nasacort can take 1-2 weeks to work after regular use. Use generic zyrtec (cetirizine) every day.  If this doesn't help, then stop taking it and use chlorpheniramine-phenylephrine combination tablets.  We will see you back in 4 weeks or sooner if needed

## 2016-11-28 NOTE — Progress Notes (Signed)
Subjective:    Patient ID: [name], male    DOB: Jan 23, 1941, 76 y.o.   MRN: [id number]  HPI Chief Complaint  Patient presents with  . Advice Only    Referred by Dr. Sharlett Iles for cough, COPD.  pt states this has been a problem X1 year.     [name] reports having a cough for years.  Last month he saw his PCP and he was treated with prednisone, albuterol for a possible COPD exacerbation.    He reports having a cough for many years: > at least 10 years he's had a cough > he didn't start talking to his PCP about it 6 months ago > he says that medicines haven't helped > The Anoro and albuterol have helped some, but he still coughs > previously he had dyspnea with severe coughing episodes, this has subsided > he coughs up mucus from time to time, typically its clear or milky in color, not bloody > hasn't had bronchitis in years that he can recall, though his wife says that he had a bad episode of this in the last 6 months > he reports having pneumonia three time  He experiences constant shortness of breath: > just walking from one end of th ehouse to another will make hime dyspneic > he hasn't been able to exercise or walk in years, since 2014 when he had his knee surgery > he can't walk more than 100 yards, this hasn't worsened since 2014 > the Anoro has helped some with his dyspnea > he takes his Anoro first thing in the morning, started less than 6 months ago  He reports having Acid reflux symptoms daily > he takes zantac twice a day > he still has some GERD despite this > he doesn't do any lifestyle modifications  Some sinus congestion: > takes afrin and delsym for it   He has a 20-pack-year smoking history, started in 1969, quit in 2007.  Review of records from his primary care physician shows a history of GERD, status post carotid endarterectomy bilaterally, thyroid cancer, obstructive sleep apnea on CPAP with initial AHI of 77, hypertension, restless legs,  hypothyroidism, gout  He was treated with prednisone and albuterol in May 2018 for a cough for a presumed COPD exacerbation.  Past Medical History:  Diagnosis Date  . Barrett esophagus   . CAD (coronary artery disease)   . Cancer ([place])    thyroid  . Carotid artery occlusion   . Chronic kidney disease   . COPD (chronic obstructive pulmonary disease) ([place])   . Diabetes mellitus without complication ([place])   . GERD (gastroesophageal reflux disease)   . History of thyroid cancer 2008  . History of thyroid cancer   . Hypertension   . OSA (obstructive sleep apnea)   . Restless leg   . Sleep apnea with use of continuous positive airway pressure (CPAP)    2011 [facility] , AHI  77cn central and obstrcutive. 16 cm water , 3 cm EPR.   . Thyroid disease   . Ulcer    Peptic ulcer disease     Family History  Problem Relation Age of Onset  . COPD Mother   . Hyperlipidemia Mother   . Hypertension Mother   . Diabetes Father   . Kidney disease Father        ESRD  . Hypertension Father   . Cancer Father   . Hyperlipidemia Father      Social History   Social  History  . Marital status: Married    Spouse name: [name]  . Number of children: 2  . Years of education: COLLEGE   Occupational History  .  Retired   Social History Main Topics  . Smoking status: Former Smoker    Years: 40.00    Types: Cigars, Cigarettes    Quit date: 06/19/2005  . Smokeless tobacco: Never Used     Comment: smoked 4-6 cigars daily, only smoked cigarettes socially  . Alcohol use No  . Drug use: No  . Sexual activity: Not on file   Other Topics Concern  . Not on file   Social History Narrative   Patient is married [name]) and lives at home with his wife.   Patient has two children.   Patient is retired.   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   Patient is right-handed.   Patient drinks three cups of coffee per day.     No Known Allergies   Outpatient Medications Prior  to Visit  Medication Sig Dispense Refill  . allopurinol (ZYLOPRIM) 300 MG tablet Take 300 mg by mouth daily.   12  . ANORO ELLIPTA 62.5-25 MCG/INH AEPB Inhale 1 puff into the lungs daily.   6  . ARNUITY ELLIPTA 100 MCG/ACT AEPB Inhale 1 puff into the lungs daily.   6  . aspirin 325 MG tablet Take 325 mg by mouth daily.    . betamethasone dipropionate (DIPROLENE) 0.05 % cream Apply topically 2 (two) times daily.    . calcium citrate-vitamin D (CITRACAL+D) 315-200 MG-UNIT per tablet Take 1 tablet by mouth 2 (two) times daily. 750 mg    . carbidopa-levodopa (SINEMET IR) 10-100 MG tablet Take 1 tablet by mouth 2 (two) times daily. 90 tablet 4  . Cholecalciferol (VITAMIN D3) 2000 UNITS TABS Take 1 tablet by mouth daily.    . cloNIDine (CATAPRES) 0.1 MG tablet Take 0.1 mg by mouth 2 (two) times daily.  8  . COLCRYS 0.6 MG tablet Take 0.6 mg by mouth daily.    . fish oil-omega-3 fatty acids 1000 MG capsule Take 3 g by mouth daily.     [name] gabapentin (NEURONTIN) 300 MG capsule Take 300 mg by mouth 2 (two) times daily.  4  . HYDROcodone-acetaminophen (NORCO/VICODIN) 5-325 MG per tablet Take 1-2 tablets by mouth every 4 (four) hours as needed.     [name] levothyroxine (SYNTHROID, LEVOTHROID) 175 MCG tablet Take 175 mcg by mouth daily before breakfast. 2 tablets    . losartan-hydrochlorothiazide (HYZAAR) 100-25 MG per tablet Take 1 tablet by mouth daily.    . metformin (FORTAMET) 500 MG (OSM) 24 hr tablet Take 500 mg by mouth 2 (two) times daily with a meal.     . metoprolol tartrate (LOPRESSOR) 25 MG tablet Take 25 mg by mouth 3 (three) times daily.    . Multiple Vitamin (MULTIVITAMIN) tablet Take 1 tablet by mouth daily.    [name] oxymetazoline (AFRIN) 0.05 % nasal spray Place 2 sprays into the nose 2 (two) times daily.    . ranitidine (ZANTAC) 150 MG tablet Take 150 mg by mouth 2 (two) times daily.    . rizatriptan (MAXALT) 10 MG tablet Take 10 mg by mouth as needed for migraine. May repeat in 2 hours if needed      . rOPINIRole (REQUIP) 1 MG tablet TAKE 1 TABLET BY MOUTH IN THE MORNING AND 4 TABLETS AT BEDTIME. 150 tablet 5  . simvastatin (ZOCOR) 20  MG tablet Take 20 mg by mouth every evening.    . TRAVATAN Z 0.004 % SOLN ophthalmic solution Place 1 drop into both eyes.     . VENTOLIN HFA 108 (90 Base) MCG/ACT inhaler Inhale 1-2 puffs into the lungs every 4 (four) hours as needed.   6  . SPIRIVA HANDIHALER 18 MCG inhalation capsule Take 1 puff by mouth daily.  0   No facility-administered medications prior to visit.       Review of Systems  Constitutional: Negative for fever and unexpected weight change.  HENT: Positive for postnasal drip, rhinorrhea and sinus pressure. Negative for congestion, dental problem, ear pain, nosebleeds, sneezing, sore throat and trouble swallowing.   Eyes: Negative for redness and itching.  Respiratory: Positive for cough, shortness of breath and wheezing. Negative for chest tightness.   Cardiovascular: Negative for palpitations and leg swelling.  Gastrointestinal: Negative for nausea and vomiting.  Genitourinary: Negative for dysuria.  Musculoskeletal: Negative for joint swelling.  Skin: Negative for rash.  Neurological: Negative for headaches.  Hematological: Does not bruise/bleed easily.  Psychiatric/Behavioral: Negative for dysphoric mood. The patient is not nervous/anxious.        Objective:   Physical Exam Vitals:   11/28/16 1022  BP: 136/74  Pulse: 61  SpO2: 95%  Weight: 229 lb (103.9 kg)  Height: 5\' 6"  (1.676 m)   Gen: chronically ill appearing obese male, no acute distress HENT: NCAT, OP clear, neck supple without masses Eyes: PERRL, EOMi Lymph: no cervical lymphadenopathy PULM: Gurgling type rattling of his upper lung zones CV: RRR, no mgr, no JVD GI: BS+, soft, nontender, no hsm Derm: no rash or skin breakdown MSK: normal bulk and tone Neuro: A&Ox4, CN II-XII intact, strength 5/5 in all 4 extremities Psyche: normal mood and  affect   Records from his primary care doctor's office, Dr. Sharlett Iles reviewed today in clinic: He is noted to be a former smoker who quit in 2007. He has hypertension and obesity. He reported worsening cough.  2012 chest x-ray images independently reviewed showing subsegmental, platelike atelectasis in the left lung, otherwise no pulmonary parenchymal abnormality.     Assessment & Plan:  Dyspnea, unspecified type - Plan: Spirometry with Graph, DG Chest 2 View  Cough  COPD mixed type (HCC)  Gastroesophageal reflux disease, esophagitis presence not specified  Seasonal allergic rhinitis, unspecified trigger  Discussion: Mr. pizano comes to me today for evaluation of shortness of breath and cough. The cough is his primary problem. While I believe that his prior tobacco use is likely contributing and he may have COPD, I think the biggest culprit is allergic rhinitis and primarily gastroesophageal reflux disease which is not well controlled.  We will evaluate him for COPD with simple spirometry testing, I think he should continue taking Anoro for now  We discussed gastroesophageal reflux disease lifestyle modification and food triggers and timing of food today. It sounds as if he has not made much effort in changing his diet to affect his gastroesophageal reflux disease in the past. If he is unwilling or unresponsive to lifestyle changes for this condition we can consider changing him to a proton pump inhibitor.  He also has allergic rhinitis which also contributes.  For your COPD: We will check a lung function tests today Keep taking Anoro as you are doing  For your gastroesophageal reflux disease: Keep taking Zantac twice a day Follow the gastroesophageal reflux disease lifestyle modification sheet we gave you today If no improvement in cough or  heartburn symptoms by the next visit then we will change her to something called a proton pump inhibitor  For your cough: I believe this is  due to acid reflux and allergic rhinitis, follow the instructions above Use the the flutter valve 4-5 breaths 4-5 times per day  For your allergic rhinitis: Use Neil Med rinses with distilled water at least twice per day using the instructions on the package. 1/2 hour after using the [facility] Med rinse, use Nasacort two puffs in each nostril once per day.  Remember that the Nasacort can take 1-2 weeks to work after regular use. Use generic zyrtec (cetirizine) every day.  If this doesn't help, then stop taking it and use chlorpheniramine-phenylephrine combination tablets.  We will see you back in 4 weeks or sooner if needed    Current Outpatient Prescriptions:  .  allopurinol (ZYLOPRIM) 300 MG tablet, Take 300 mg by mouth daily. , Disp: , Rfl: 12 .  ANORO ELLIPTA 62.5-25 MCG/INH AEPB, Inhale 1 puff into the lungs daily. , Disp: , Rfl: 6 .  ARNUITY ELLIPTA 100 MCG/ACT AEPB, Inhale 1 puff into the lungs daily. , Disp: , Rfl: 6 .  aspirin 325 MG tablet, Take 325 mg by mouth daily., Disp: , Rfl:  .  betamethasone dipropionate (DIPROLENE) 0.05 % cream, Apply topically 2 (two) times daily., Disp: , Rfl:  .  calcium citrate-vitamin D (CITRACAL+D) 315-200 MG-UNIT per tablet, Take 1 tablet by mouth 2 (two) times daily. 750 mg, Disp: , Rfl:  .  carbidopa-levodopa (SINEMET IR) 10-100 MG tablet, Take 1 tablet by mouth 2 (two) times daily., Disp: 90 tablet, Rfl: 4 .  Cholecalciferol (VITAMIN D3) 2000 UNITS TABS, Take 1 tablet by mouth daily., Disp: , Rfl:  .  cloNIDine (CATAPRES) 0.1 MG tablet, Take 0.1 mg by mouth 2 (two) times daily., Disp: , Rfl: 8 .  COLCRYS 0.6 MG tablet, Take 0.6 mg by mouth daily., Disp: , Rfl:  .  fish oil-omega-3 fatty acids 1000 MG capsule, Take 3 g by mouth daily. , Disp: , Rfl:  .  gabapentin (NEURONTIN) 300 MG capsule, Take 300 mg by mouth 2 (two) times daily., Disp: , Rfl: 4 .  HYDROcodone-acetaminophen (NORCO/VICODIN) 5-325 MG per tablet, Take 1-2 tablets by mouth every 4  (four) hours as needed. , Disp: , Rfl:  .  levothyroxine (SYNTHROID, LEVOTHROID) 175 MCG tablet, Take 175 mcg by mouth daily before breakfast. 2 tablets, Disp: , Rfl:  .  losartan-hydrochlorothiazide (HYZAAR) 100-25 MG per tablet, Take 1 tablet by mouth daily., Disp: , Rfl:  .  metformin (FORTAMET) 500 MG (OSM) 24 hr tablet, Take 500 mg by mouth 2 (two) times daily with a meal. , Disp: , Rfl:  .  metoprolol tartrate (LOPRESSOR) 25 MG tablet, Take 25 mg by mouth 3 (three) times daily., Disp: , Rfl:  .  Multiple Vitamin (MULTIVITAMIN) tablet, Take 1 tablet by mouth daily., Disp: , Rfl:  .  oxymetazoline (AFRIN) 0.05 % nasal spray, Place 2 sprays into the nose 2 (two) times daily., Disp: , Rfl:  .  ranitidine (ZANTAC) 150 MG tablet, Take 150 mg by mouth 2 (two) times daily., Disp: , Rfl:  .  rizatriptan (MAXALT) 10 MG tablet, Take 10 mg by mouth as needed for migraine. May repeat in 2 hours if needed, Disp: , Rfl:  .  rOPINIRole (REQUIP) 1 MG tablet, TAKE 1 TABLET BY MOUTH IN THE MORNING AND 4 TABLETS AT BEDTIME., Disp: 150 tablet, Rfl: 5 .  simvastatin (ZOCOR) 20 MG tablet, Take 20 mg by mouth every evening., Disp: , Rfl:  .  TRAVATAN Z 0.004 % SOLN ophthalmic solution, Place 1 drop into both eyes. , Disp: , Rfl:  .  VENTOLIN HFA 108 (90 Base) MCG/ACT inhaler, Inhale 1-2 puffs into the lungs every 4 (four) hours as needed. , Disp: , Rfl: 6

## 2016-12-26 ENCOUNTER — Other Ambulatory Visit (INDEPENDENT_AMBULATORY_CARE_PROVIDER_SITE_OTHER): Payer: Medicare Other

## 2016-12-26 ENCOUNTER — Encounter: Payer: Self-pay | Admitting: Adult Health

## 2016-12-26 ENCOUNTER — Telehealth: Payer: Self-pay | Admitting: *Deleted

## 2016-12-26 ENCOUNTER — Ambulatory Visit (INDEPENDENT_AMBULATORY_CARE_PROVIDER_SITE_OTHER): Payer: Medicare Other | Admitting: Adult Health

## 2016-12-26 VITALS — BP 102/76 | HR 64 | Ht 66.0 in | Wt 224.6 lb

## 2016-12-26 DIAGNOSIS — Z9989 Dependence on other enabling machines and devices: Secondary | ICD-10-CM

## 2016-12-26 DIAGNOSIS — R053 Chronic cough: Secondary | ICD-10-CM

## 2016-12-26 DIAGNOSIS — R05 Cough: Secondary | ICD-10-CM | POA: Diagnosis not present

## 2016-12-26 DIAGNOSIS — G4733 Obstructive sleep apnea (adult) (pediatric): Secondary | ICD-10-CM | POA: Diagnosis not present

## 2016-12-26 DIAGNOSIS — R0602 Shortness of breath: Secondary | ICD-10-CM | POA: Diagnosis not present

## 2016-12-26 DIAGNOSIS — R0609 Other forms of dyspnea: Secondary | ICD-10-CM | POA: Diagnosis not present

## 2016-12-26 DIAGNOSIS — R0600 Dyspnea, unspecified: Secondary | ICD-10-CM | POA: Insufficient documentation

## 2016-12-26 LAB — CBC WITH DIFFERENTIAL/PLATELET
BASOS ABS: 0.1 10*3/uL (ref 0.0–0.1)
Basophils Relative: 0.9 % (ref 0.0–3.0)
Eosinophils Absolute: 0.5 10*3/uL (ref 0.0–0.7)
Eosinophils Relative: 4.2 % (ref 0.0–5.0)
HCT: 43.7 % (ref 39.0–52.0)
HEMOGLOBIN: 14.7 g/dL (ref 13.0–17.0)
LYMPHS ABS: 1.9 10*3/uL (ref 0.7–4.0)
Lymphocytes Relative: 17.8 % (ref 12.0–46.0)
MCHC: 33.7 g/dL (ref 30.0–36.0)
MCV: 86.6 fl (ref 78.0–100.0)
Monocytes Absolute: 1 10*3/uL (ref 0.1–1.0)
Monocytes Relative: 9.3 % (ref 3.0–12.0)
NEUTROS PCT: 67.8 % (ref 43.0–77.0)
Neutro Abs: 7.4 10*3/uL (ref 1.4–7.7)
Platelets: 226 10*3/uL (ref 150.0–400.0)
RBC: 5.05 Mil/uL (ref 4.22–5.81)
RDW: 15.2 % (ref 11.5–15.5)
WBC: 11 10*3/uL — AB (ref 4.0–10.5)

## 2016-12-26 LAB — BASIC METABOLIC PANEL
BUN: 28 mg/dL — ABNORMAL HIGH (ref 6–23)
CALCIUM: 9.5 mg/dL (ref 8.4–10.5)
CHLORIDE: 103 meq/L (ref 96–112)
CO2: 30 meq/L (ref 19–32)
Creatinine, Ser: 1.15 mg/dL (ref 0.40–1.50)
GFR: 65.75 mL/min (ref >60.00)
GLUCOSE: 133 mg/dL — AB (ref 70–99)
Potassium: 3.9 mEq/L (ref 3.5–5.1)
SODIUM: 143 meq/L (ref 135–145)

## 2016-12-26 LAB — BRAIN NATRIURETIC PEPTIDE: Pro B Natriuretic peptide (BNP): 86 pg/mL (ref 0.0–100.0)

## 2016-12-26 NOTE — Assessment & Plan Note (Addendum)
DOE ? Etiology  Pt does have moderate restriction on Spirometry , crackles on exam . May be multifactoral with obesity , OSA , ? Diastolic dysfunction (edema )  Continue with workup  Needs full PFT w/ dlco  Check HRCT ? ILD  Check labs with BNP , if elevated consider echo  Check ONO on CPAP ? Noct desats depspite CPAP .  Left hemidiaphragm elevation , appears chronic since 2007 . May need SNIFF test going forward. Will look at on CT CHest   paln  Patient Instructions  Set up for HRCT chest .  Labs today .  Continue on ARNUITY and ANORO daily . , rinse after use.  Begin Delsym 2 tsp Twice daily for cough .  Mucinex Twice daily  As needed  Congestion .  Begin Zyrtec 10mg  daily  Begin saline nasal spray/rinse Twice daily  .  Begin Nasacort 2 puff daily  Wean off Afrin.  Check oxygen test At bedtime  On CPAP .  follow up Dr. Lake Bells in 6 weeks with PFT and As needed   Please contact office for sooner follow up if symptoms do not improve or worsen or seek emergency care

## 2016-12-26 NOTE — Progress Notes (Signed)
@Patient  ID: [name], male    DOB: 1940-07-29, 76 y.o.   MRN: [id number]  Chief Complaint  Patient presents with  . Follow-up    Referring provider: [name], MD  HPI: 76 year old male former smoker, presented for pulmonary consult 11/28/2016 for dyspnea, cough and possible COPD Has OSA on CPAP     12/26/2016 Follow up :  Patient returns for a one-month follow-up. Patient was seen last visit for pulmonary consult for dyspnea, cough and possible COPD. Complaining been having a cough for about 10 years. Had been tried on ARNUITY and ANORO and albuterol without much improvement. He was also having shortness of breath with activity. He was having nasal congestion, drainage and acid reflux. Spirometry showed moderate restriction with an FVC at 58%, FEV1 62%, ratio 77. Patient was recommended to continue on Zantac twice daily. To begin  Zyrtec and Nasacort. Chest x-ray on June 12 showed a elevated left hemi diaphragm He did not start zyrtec or nasacort.  He gets leg swelling most days . No orthopnea.  Wears CPAP At bedtime  . Sleep well.    No Known Allergies  Immunization History  Administered Date(s) Administered  . Influenza, High Dose Seasonal PF 03/30/2016    Past Medical History:  Diagnosis Date  . Barrett esophagus   . CAD (coronary artery disease)   . Cancer ([place])    thyroid  . Carotid artery occlusion   . Chronic kidney disease   . COPD (chronic obstructive pulmonary disease) ([place])   . Diabetes mellitus without complication ([place])   . GERD (gastroesophageal reflux disease)   . History of thyroid cancer 2008  . History of thyroid cancer   . Hypertension   . OSA (obstructive sleep apnea)   . Restless leg   . Sleep apnea with use of continuous positive airway pressure (CPAP)    [facility] , AHI  77cn central and obstrcutive. 16 cm water , 3 cm EPR.   . Thyroid disease   . Ulcer    Peptic ulcer disease    Tobacco History: History  Smoking  Status  . Former Smoker  . Years: 40.00  . Types: Cigars, Cigarettes  . Quit date: 06/19/2005  Smokeless Tobacco  . Never Used    Comment: smoked 4-6 cigars daily, only smoked cigarettes socially   Counseling given: Not Answered   Outpatient Encounter Prescriptions as of 12/26/2016  Medication Sig  . allopurinol (ZYLOPRIM) 300 MG tablet Take 300 mg by mouth daily.   Jearl Klinefelter ELLIPTA 62.5-25 MCG/INH AEPB Inhale 1 puff into the lungs daily.   . ARNUITY ELLIPTA 100 MCG/ACT AEPB Inhale 1 puff into the lungs daily.   [name] aspirin 325 MG tablet Take 325 mg by mouth daily.  . betamethasone dipropionate (DIPROLENE) 0.05 % cream Apply topically 2 (two) times daily.  . calcium citrate-vitamin D (CITRACAL+D) 315-200 MG-UNIT per tablet Take 1 tablet by mouth 2 (two) times daily. 750 mg  . carbidopa-levodopa (SINEMET IR) 10-100 MG tablet Take 1 tablet by mouth 2 (two) times daily.  . Cholecalciferol (VITAMIN D3) 2000 UNITS TABS Take 1 tablet by mouth daily.  . cloNIDine (CATAPRES) 0.1 MG tablet Take 0.1 mg by mouth 2 (two) times daily.  [name] COLCRYS 0.6 MG tablet Take 0.6 mg by mouth daily.  . fish oil-omega-3 fatty acids 1000 MG capsule Take 3 g by mouth daily.   [name] gabapentin (NEURONTIN) 300 MG capsule Take 300 mg by mouth 2 (two) times daily.  [name]  HYDROcodone-acetaminophen (NORCO/VICODIN) 5-325 MG per tablet Take 1-2 tablets by mouth every 4 (four) hours as needed.   [name] levothyroxine (SYNTHROID, LEVOTHROID) 200 MCG tablet Take 200 mcg by mouth daily before breakfast.   . losartan-hydrochlorothiazide (HYZAAR) 100-25 MG per tablet Take 1 tablet by mouth daily.  . metformin (FORTAMET) 500 MG (OSM) 24 hr tablet Take 500 mg by mouth 2 (two) times daily with a meal.   . metoprolol tartrate (LOPRESSOR) 25 MG tablet Take 25 mg by mouth 3 (three) times daily.  . Multiple Vitamin (MULTIVITAMIN) tablet Take 1 tablet by mouth daily.  [name] oxymetazoline (AFRIN) 0.05 % nasal spray Place 2 sprays into the nose 2 (two) times  daily.  [name] Respiratory Therapy Supplies (FLUTTER) DEVI Use as directed  . rizatriptan (MAXALT) 10 MG tablet Take 10 mg by mouth as needed for migraine. May repeat in 2 hours if needed  . rOPINIRole (REQUIP) 1 MG tablet TAKE 1 TABLET BY MOUTH IN THE MORNING AND 4 TABLETS AT BEDTIME.  . simvastatin (ZOCOR) 20 MG tablet Take 20 mg by mouth every evening.  . TRAVATAN Z 0.004 % SOLN ophthalmic solution Place 1 drop into both eyes.   . VENTOLIN HFA 108 (90 Base) MCG/ACT inhaler Inhale 1-2 puffs into the lungs every 4 (four) hours as needed.   . ranitidine (ZANTAC) 150 MG tablet Take 150 mg by mouth 2 (two) times daily.   No facility-administered encounter medications on file as of 12/26/2016.      Review of Systems  Constitutional:   No  weight loss, night sweats,  Fevers, chills,  +fatigue, or  lassitude.  HEENT:   No headaches,  Difficulty swallowing,  Tooth/dental problems, or  Sore throat,                No sneezing, itching, ear ache,  +nasal congestion, post nasal drip,   CV:  No chest pain,  Orthopnea, PND, swelling in lower extremities, anasarca, dizziness, palpitations, syncope.   GI  No heartburn, indigestion, abdominal pain, nausea, vomiting, diarrhea, change in bowel habits, loss of appetite, bloody stools.   Resp:   No chest wall deformity  Skin: no rash or lesions.  GU: no dysuria, change in color of urine, no urgency or frequency.  No flank pain, no hematuria   MS:  No joint pain or swelling.  No decreased range of motion.  No back pain.    Physical Exam  BP 102/76 (BP Location: Right Arm, Cuff Size: Normal)   Pulse 64   Ht 5\' 6"  (1.676 m)   Wt 224 lb 9.6 oz (101.9 kg)   SpO2 94%   BMI 36.25 kg/m   GEN: A/Ox3; pleasant , NAD , obese , Chronically ill appearing    HEENT:  [place]/AT,  EACs-clear, TMs-wnl, NOSE-clear, THROAT-clear, no lesions, no postnasal drip or exudate noted. Class 2-3 MP airway   NECK:  Supple w/ fair ROM; no JVD; normal carotid impulses w/o  bruits; no thyromegaly or nodules palpated; no lymphadenopathy.    RESP  BB crackles ,  no accessory muscle use, no dullness to percussion  CARD:  RRR, no m/r/g, 1+ peripheral edema, pulses intact, no cyanosis or clubbing.  GI:   Soft & nt; nml bowel sounds; no organomegaly or masses detected.   Musco: Warm bil, no deformities or joint swelling noted.   Neuro: alert, no focal deficits noted.    Skin: Warm, no lesions or rashes    Lab Results:  BNP No results found  for: BNP  ProBNP No results found for: PROBNP  Imaging: Dg Chest 2 View  Result Date: 11/28/2016 CLINICAL DATA:  Chronic dyspnea and cough EXAM: CHEST  2 VIEW COMPARISON:  08/03/2010 FINDINGS: Cardiac shadow is mildly enlarged but stable. The lungs are well aerated bilaterally. Elevation of left hemidiaphragm is again seen. No focal infiltrate or sizable effusion is noted. Degenerative changes of the thoracic spine and right shoulder joint are seen. Postsurgical changes at the thoracic inlet are noted. IMPRESSION: No acute abnormality seen. Electronically Signed   By: Inez Catalina M.D.   On: 11/28/2016 13:28     Assessment & Plan:   No problem-specific Assessment & Plan notes found for this encounter.     Rexene Edison, NP 12/26/2016

## 2016-12-26 NOTE — Telephone Encounter (Signed)
Called pt to discuss the results of his labwork. Pt aware of results and told to follow up with the CT and that we would call him once we have the results. Told pt to follow up with his next OV and to call us if he needed anything sooner from [place].  Nothing further needed at this time.

## 2016-12-26 NOTE — Patient Instructions (Signed)
Set up for HRCT chest .  Labs today .  Continue on ARNUITY and ANORO daily . , rinse after use.  Begin Delsym 2 tsp Twice daily for cough .  Mucinex Twice daily  As needed  Congestion .  Begin Zyrtec 10mg  daily  Begin saline nasal spray/rinse Twice daily  .  Begin Nasacort 2 puff daily  Wean off Afrin.  Check oxygen test At bedtime  On CPAP .  follow up Dr. Lake Bells in 6 weeks with PFT and As needed   Please contact office for sooner follow up if symptoms do not improve or worsen or seek emergency care

## 2016-12-26 NOTE — Assessment & Plan Note (Signed)
Chronic cough - ?GERD /AR  Crackles on exam and restriction on spirometry needs HRCT chest   Plan  Patient Instructions  Set up for HRCT chest .  Labs today .  Continue on ARNUITY and ANORO daily . , rinse after use.  Begin Delsym 2 tsp Twice daily for cough .  Mucinex Twice daily  As needed  Congestion .  Begin Zyrtec 10mg  daily  Begin saline nasal spray/rinse Twice daily  .  Begin Nasacort 2 puff daily  Wean off Afrin.  Check oxygen test At bedtime  On CPAP .  follow up Dr. Lake Bells in 6 weeks with PFT and As needed   Please contact office for sooner follow up if symptoms do not improve or worsen or seek emergency care

## 2016-12-26 NOTE — Assessment & Plan Note (Signed)
Check on ONO on CPAP .

## 2016-12-27 ENCOUNTER — Telehealth: Payer: Self-pay | Admitting: Adult Health

## 2016-12-27 NOTE — Telephone Encounter (Signed)
Entered in error

## 2016-12-27 NOTE — Telephone Encounter (Signed)
Called pt to discuss the results of his labwork. Pt aware of results and told to follow up with the CT and that we would call him once we have the results. Told pt to follow up with his next OV and to call us if he needed anything sooner from [place].  Nothing further needed at this time.

## 2017-01-03 ENCOUNTER — Ambulatory Visit (INDEPENDENT_AMBULATORY_CARE_PROVIDER_SITE_OTHER)
Admission: RE | Admit: 2017-01-03 | Discharge: 2017-01-03 | Disposition: A | Discharge status: Home / Self Care | Payer: Medicare Other | Source: Ambulatory Visit | Attending: Adult Health | Admitting: Adult Health

## 2017-01-03 DIAGNOSIS — R053 Chronic cough: Secondary | ICD-10-CM

## 2017-01-03 DIAGNOSIS — R0602 Shortness of breath: Secondary | ICD-10-CM

## 2017-01-03 DIAGNOSIS — R05 Cough: Secondary | ICD-10-CM | POA: Diagnosis not present

## 2017-01-03 IMAGING — CT CT CHEST HIGH RESOLUTION W/O CM
2 of 7 series · 13 of 36 positions shown, 16 images · non-contrast
Comparison: No priors.

CLINICAL DATA: 75-year-old male with history of chronic cough and
shortness of breath. COPD. Former smoker.

EXAM:
CT CHEST WITHOUT CONTRAST
TECHNIQUE: Multidetector CT imaging of the chest was performed following the
standard protocol without intravenous contrast. High resolution
imaging of the lungs, as well as inspiratory and expiratory imaging,
was performed.

[Series 5: high resolution · axial · 0.91mm/px · z∈[+1236,+1474]mm · 10 of 135 slices shown, 13 images]
[im 8/135  mediastinal]
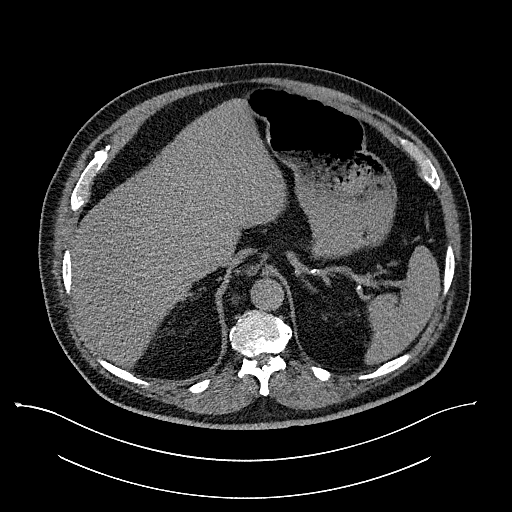
[im 8/135  lung]
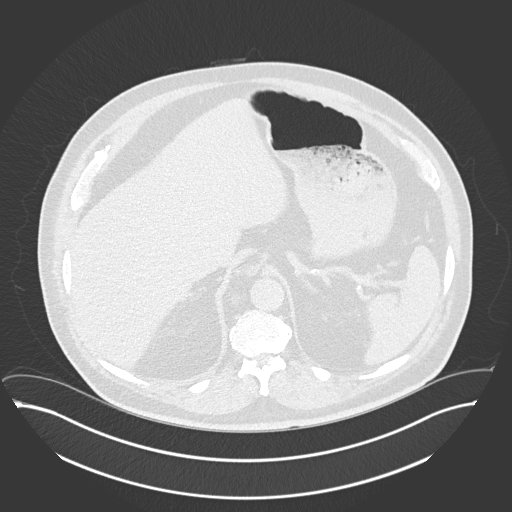
[im 23/135  lung]
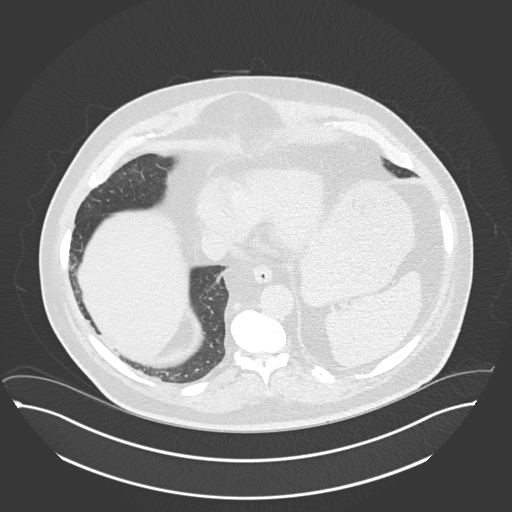
[im 38/135  lung]
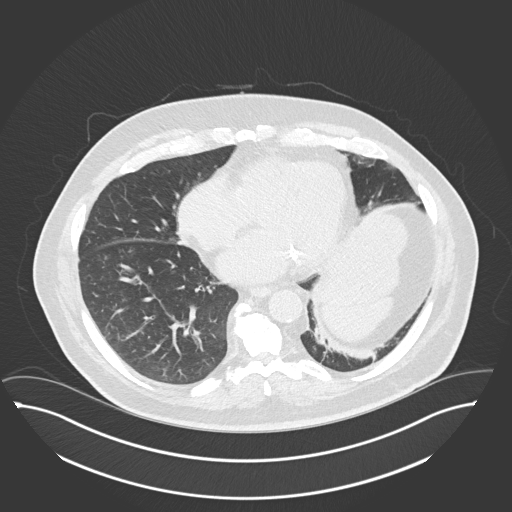
[im 45/135  lung]
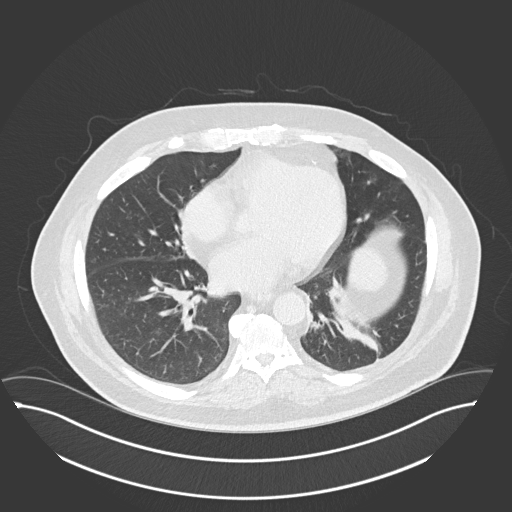
[im 60/135  mediastinal]
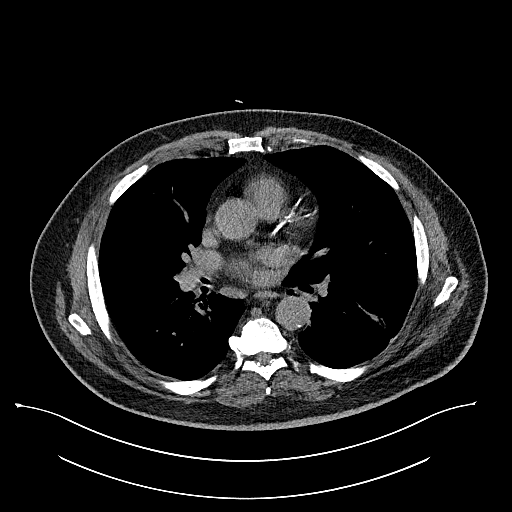
[im 60/135  lung]
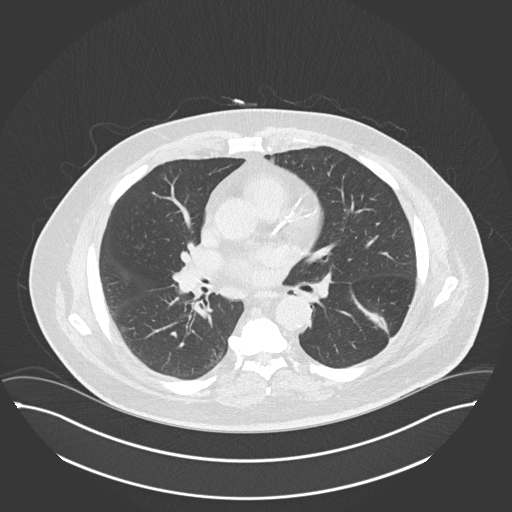
[im 75/135  lung]
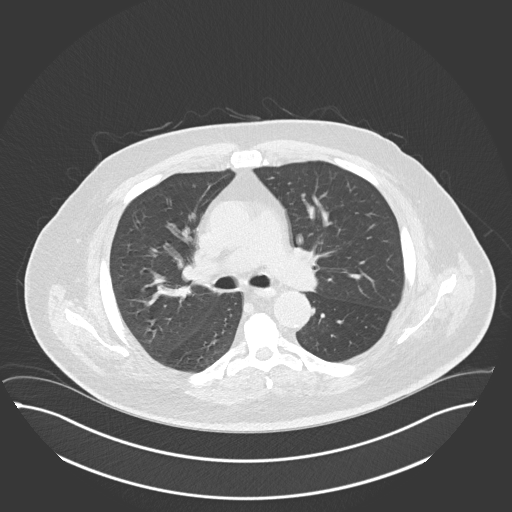
[im 90/135  lung]
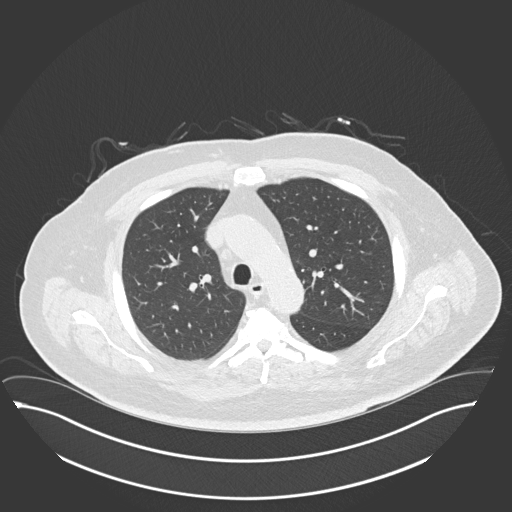
[im 97/135  lung]
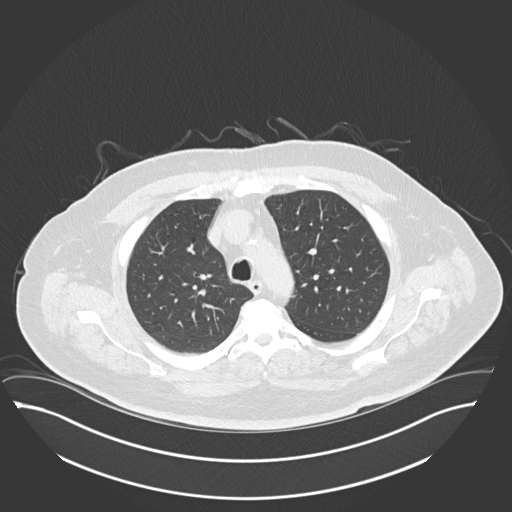
[im 112/135  mediastinal]
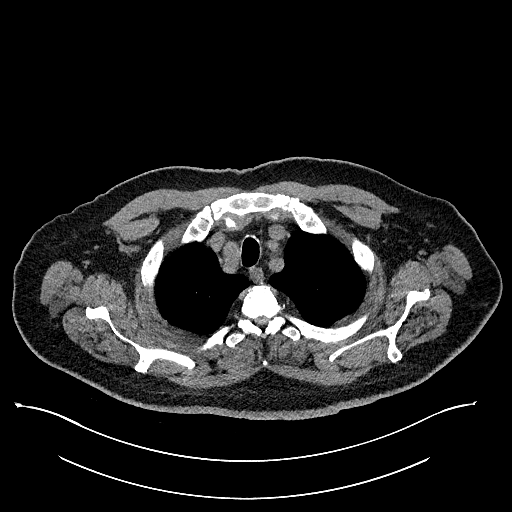
[im 112/135  lung]
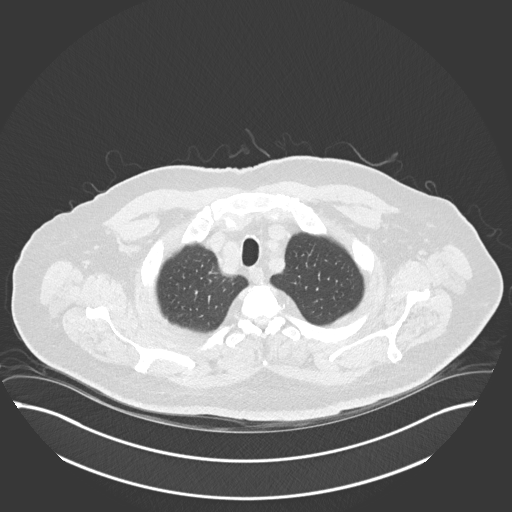
[im 127/135  lung]
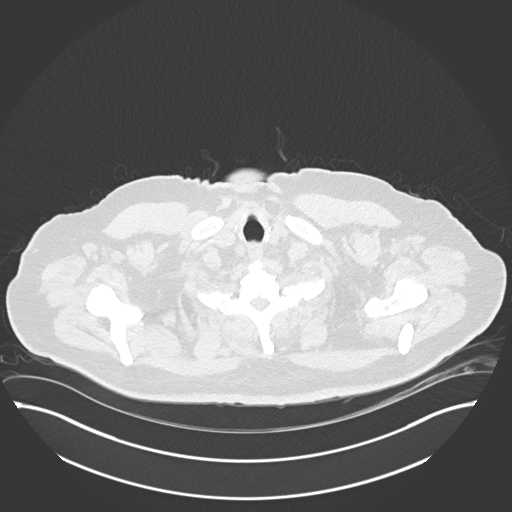

[Series 8: coronal · coronal · 0.54mm/px · 3 of 166 slices shown]
[im 34/166  lung]
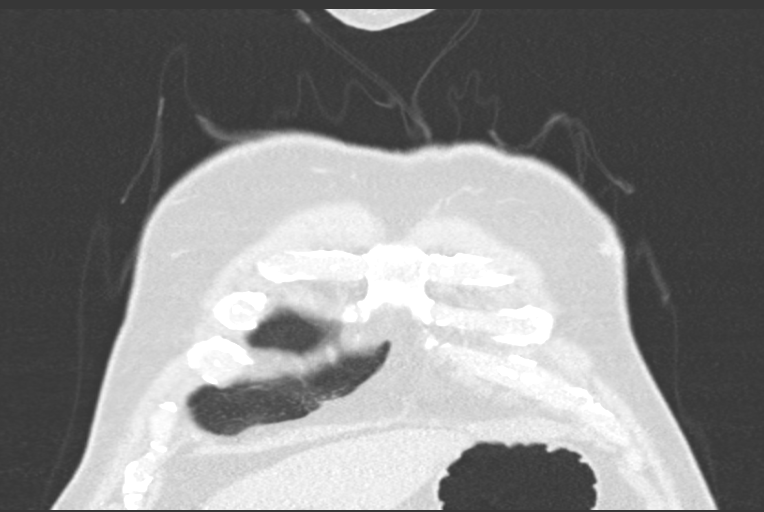
[im 67/166  lung]
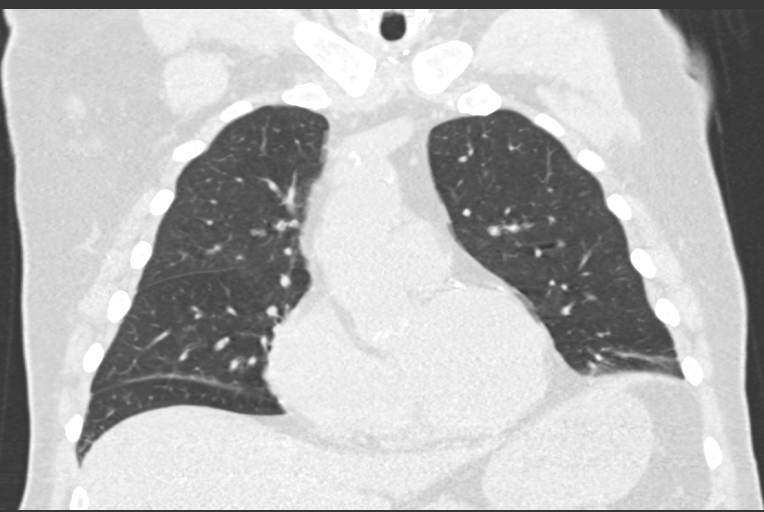
[im 100/166  lung]
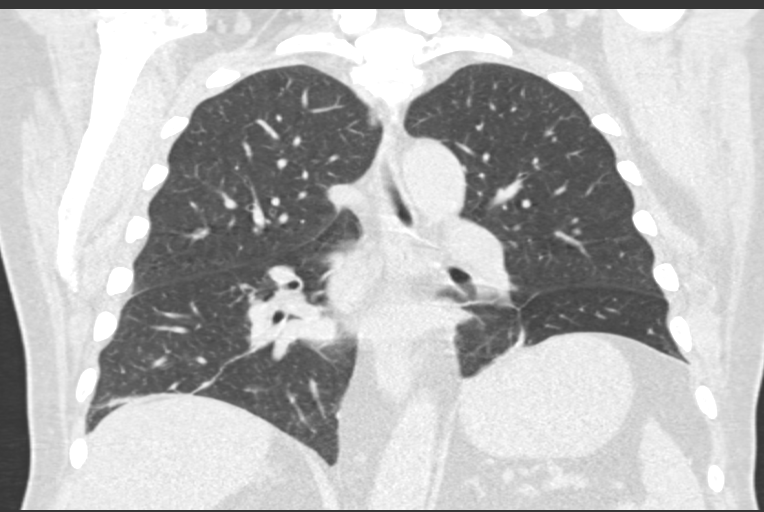

[13 of 36 positions shown; findings below may reference images not displayed]

FINDINGS: Cardiovascular: Heart size is normal. There is no significant
pericardial fluid, thickening or pericardial calcification. There is
aortic atherosclerosis, as well as atherosclerosis of the great
vessels of the mediastinum and the coronary arteries, including
calcified atherosclerotic plaque in the left main, left anterior
descending, left circumflex and right coronary arteries.
Calcifications of the aortic valve and mitral annulus.

Mediastinum/Nodes: No pathologically enlarged mediastinal or hilar
lymph nodes. Please note that accurate exclusion of hilar adenopathy
is limited on noncontrast CT scans. Esophagus is unremarkable in
appearance. No axillary lymphadenopathy.

Lungs/Pleura: Linear area of apparent scarring in knee left lower
lobe. High-resolution images demonstrate some very subtle areas of
mild ground-glass attenuation and septal thickening in the lung
bases, most evident in the right lower lobe. No traction
bronchiectasis or frank honeycombing. Inspiratory and expiratory
imaging demonstrates some mild air trapping indicative of small
airways disease.

Upper Abdomen: Diffuse low attenuation throughout the visualized
hepatic parenchyma, compatible with hepatic steatosis. There is an
incompletely visualized right adrenal nodule measuring 1.6 x 1.2 cm
which is low-intermediate attenuation (17 HU), considered
indeterminate.

Musculoskeletal: There are no aggressive appearing lytic or blastic
lesions noted in the visualized portions of the skeleton.
IMPRESSION: 1. Subtle areas of ground-glass attenuation and septal thickening in
the lung bases, most evident in the right lower lobe. This is
nonspecific, but could be a very early manifestation or mild
manifestation of interstitial lung disease such as nonspecific
interstitial pneumonia (NSIP). Repeat high-resolution chest CT is
recommended in 12 months to assess for temporal changes in the
appearance of the lung parenchyma.
2. Large linear area of scarring in the left lower lobe, likely
sequela of prior infection.
3. Aortic atherosclerosis, in addition to left main and 3 vessel
coronary artery disease. Please note that although the presence of
coronary artery calcium documents the presence of coronary artery
disease, the severity of this disease and any potential stenosis
cannot be assessed on this non-gated CT examination. Assessment for
potential risk factor modification, dietary therapy or pharmacologic
therapy may be warranted, if clinically indicated.
4. There are calcifications of the aortic valve and mitral annulus.
Echocardiographic correlation for evaluation of potential valvular
dysfunction may be warranted if clinically indicated.
5. Hepatic steatosis.
6. Incompletely visualized lesion in the right adrenal gland
measuring 1.2 x 1.6 cm is indeterminate. In the absence of a history
of primary malignancy, this is considered likely benign. Twelve
month follow-up adrenal protocol CT scan could be considered if of
clinical concern. This recommendation follows ACR consensus
guidelines: Management of Incidental Adrenal Masses: A White Paper
of the ACR Incidental Findings Committee. [HOSPITAL]
[HE];14:[PHONE_NUMBER].

Aortic Atherosclerosis ([HE]-[HE]).

## 2017-01-06 ENCOUNTER — Encounter: Payer: Self-pay | Admitting: Adult Health

## 2017-01-15 NOTE — Progress Notes (Signed)
Called spoke with patient, advised of CT results / recs as stated by TP.  Pt verbalized his understanding and denied any questions.  Report routed to PCP.

## 2017-01-17 ENCOUNTER — Telehealth: Payer: Self-pay | Admitting: Adult Health

## 2017-01-17 NOTE — Telephone Encounter (Signed)
Pt saw TP on 7.10.18 and ONO on CPAP on room air was ordered to see if desaturations at night explained the dyspnea  Dyspnea - Parrett, Fonnie Mu, NP at 12/26/2016 11:58 AM  Status: Edited  Related Problem: Dyspnea    DOE ? Etiology  Pt does have moderate restriction on Spirometry , crackles on exam . May be multifactoral with obesity , OSA , ? Diastolic dysfunction (edema )  Continue with workup  Needs full PFT w/ dlco  Check HRCT ? ILD  Check labs with BNP , if elevated consider echo  Check ONO on CPAP ? Noct desats depspite CPAP .  Left hemidiaphragm elevation , appears chronic since 2007 . May need SNIFF test going forward. Will look at on CT CHest       7.21.18 ONO received and reviewed by TP: no significant desat on CPAP.  No O2 indicated.  Called spoke with patient and discussed results/recommendations as stated by TP Pt voiced his understanding and will call the office if anything is needed prior to next appt Nothing further needed at this time; will sign off

## 2017-02-06 ENCOUNTER — Ambulatory Visit (INDEPENDENT_AMBULATORY_CARE_PROVIDER_SITE_OTHER): Payer: Medicare Other | Admitting: Adult Health

## 2017-02-06 ENCOUNTER — Encounter: Payer: Self-pay | Admitting: Adult Health

## 2017-02-06 DIAGNOSIS — R053 Chronic cough: Secondary | ICD-10-CM

## 2017-02-06 DIAGNOSIS — G4733 Obstructive sleep apnea (adult) (pediatric): Secondary | ICD-10-CM

## 2017-02-06 DIAGNOSIS — R05 Cough: Secondary | ICD-10-CM

## 2017-02-06 DIAGNOSIS — Z9989 Dependence on other enabling machines and devices: Secondary | ICD-10-CM | POA: Diagnosis not present

## 2017-02-06 MED ORDER — ARNUITY ELLIPTA 100 MCG/ACT IN AEPB: 1.0000 | INHALATION_SPRAY | Freq: Every day | RESPIRATORY_TRACT | 6 refills | Status: DC

## 2017-02-06 MED ORDER — ANORO ELLIPTA 62.5-25 MCG/INH IN AEPB: 1.0000 | INHALATION_SPRAY | Freq: Every day | RESPIRATORY_TRACT | 6 refills | Status: DC

## 2017-02-06 NOTE — Progress Notes (Signed)
@Patient  ID: [name], male    DOB: [birth year], 76 y.o.   MRN: [id number]  Chief Complaint  Patient presents with  . Follow-up    Dyspnea     Referring provider: [name], MD  HPI: 76 year old male former smoker, presented for pulmonary consult 11/28/2016 for dyspnea, cough and possible COPD Has OSA on CPAP    TEST  7.21.18 ONO : no significant desat on CPAP.  No O2 indicated. Spirometry showed moderate restriction with an FVC at 58%, FEV1 62%, ratio 77.   02/06/2017 Follow up : COPD  Pt returns for 6 week follow up for COPD . Seen last ov with spirometry showing moderate restriction . He was set up for HRCT chest that showed mild GG attenuation in bases R>L , scarring in LLL.. No honeycombing or traction bronchiectasis .  Feels cough is better. Started on Delsym , Mucinex, zyrtec, nasacort last ov. Feels it has helped.  Tries to be active.  On CPAP for OSA . ONO done last ov w/ no sign desats.   . No Known Allergies  Immunization History  Administered Date(s) Administered  . Influenza, High Dose Seasonal PF 03/30/2016    Past Medical History:  Diagnosis Date  . Barrett esophagus   . CAD (coronary artery disease)   . Cancer ([place])    thyroid  . Carotid artery occlusion   . Chronic kidney disease   . COPD (chronic obstructive pulmonary disease) ([place])   . Diabetes mellitus without complication ([place])   . GERD (gastroesophageal reflux disease)   . History of thyroid cancer 2008  . History of thyroid cancer   . Hypertension   . OSA (obstructive sleep apnea)   . Restless leg   . Sleep apnea with use of continuous positive airway pressure (CPAP)    2011 [facility] , AHI  77cn central and obstrcutive. 16 cm water , 3 cm EPR.   . Thyroid disease   . Ulcer    Peptic ulcer disease    Tobacco History: History  Smoking Status  . Former Smoker  . Years: 40.00  . Types: Cigars, Cigarettes  . Quit date: 06/19/2005  Smokeless Tobacco  . Never Used   Comment: smoked 4-6 cigars daily, only smoked cigarettes socially   Counseling given: Not Answered   Outpatient Encounter Prescriptions as of 02/06/2017  Medication Sig  . allopurinol (ZYLOPRIM) 300 MG tablet Take 300 mg by mouth daily.   Jearl Klinefelter ELLIPTA 62.5-25 MCG/INH AEPB Inhale 1 puff into the lungs daily.   . ARNUITY ELLIPTA 100 MCG/ACT AEPB Inhale 1 puff into the lungs daily.   [name] aspirin 325 MG tablet Take 325 mg by mouth daily.  . betamethasone dipropionate (DIPROLENE) 0.05 % cream Apply topically 2 (two) times daily.  . calcium citrate-vitamin D (CITRACAL+D) 315-200 MG-UNIT per tablet Take 1 tablet by mouth 2 (two) times daily. 750 mg  . carbidopa-levodopa (SINEMET IR) 10-100 MG tablet Take 1 tablet by mouth 2 (two) times daily.  . Cholecalciferol (VITAMIN D3) 2000 UNITS TABS Take 1 tablet by mouth daily.  . cloNIDine (CATAPRES) 0.1 MG tablet Take 0.1 mg by mouth 2 (two) times daily.  [name] COLCRYS 0.6 MG tablet Take 0.6 mg by mouth daily.  . fish oil-omega-3 fatty acids 1000 MG capsule Take 3 g by mouth daily.   [name] gabapentin (NEURONTIN) 300 MG capsule Take 300 mg by mouth 2 (two) times daily.  [name] HYDROcodone-acetaminophen (NORCO/VICODIN) 5-325 MG per tablet Take 1-2 tablets by  mouth every 4 (four) hours as needed.   [name] levothyroxine (SYNTHROID, LEVOTHROID) 200 MCG tablet Take 200 mcg by mouth daily before breakfast.   . losartan-hydrochlorothiazide (HYZAAR) 100-25 MG per tablet Take 1 tablet by mouth daily.  . metformin (FORTAMET) 500 MG (OSM) 24 hr tablet Take 500 mg by mouth 2 (two) times daily with a meal.   . Multiple Vitamin (MULTIVITAMIN) tablet Take 1 tablet by mouth daily.  [name] oxymetazoline (AFRIN) 0.05 % nasal spray Place 2 sprays into the nose 2 (two) times daily.  [name] Respiratory Therapy Supplies (FLUTTER) DEVI Use as directed  . rizatriptan (MAXALT) 10 MG tablet Take 10 mg by mouth as needed for migraine. May repeat in 2 hours if needed  . rOPINIRole (REQUIP) 1 MG tablet TAKE 1  TABLET BY MOUTH IN THE MORNING AND 4 TABLETS AT BEDTIME.  . simvastatin (ZOCOR) 20 MG tablet Take 20 mg by mouth every evening.  . TRAVATAN Z 0.004 % SOLN ophthalmic solution Place 1 drop into both eyes.   . VENTOLIN HFA 108 (90 Base) MCG/ACT inhaler Inhale 1-2 puffs into the lungs every 4 (four) hours as needed.   . [DISCONTINUED] metoprolol tartrate (LOPRESSOR) 25 MG tablet Take 25 mg by mouth 3 (three) times daily.  . [DISCONTINUED] ranitidine (ZANTAC) 150 MG tablet Take 150 mg by mouth 2 (two) times daily.   No facility-administered encounter medications on file as of 02/06/2017.      Review of Systems  Constitutional:   No  weight loss, night sweats,  Fevers, chills, fatigue, or  lassitude.  HEENT:   No headaches,  Difficulty swallowing,  Tooth/dental problems, or  Sore throat,                No sneezing, itching, ear ache, nasal congestion, post nasal drip,   CV:  No chest pain,  Orthopnea, PND,  , anasarca, dizziness, palpitations, syncope.   GI  No heartburn, indigestion, abdominal pain, nausea, vomiting, diarrhea, change in bowel habits, loss of appetite, bloody stools.   Resp: No shortness of breath with exertion or at rest.  No excess mucus, no productive cough,  No non-productive cough,  No coughing up of blood.  No change in color of mucus.  No wheezing.  No chest wall deformity  Skin: no rash or lesions.  GU: no dysuria, change in color of urine, no urgency or frequency.  No flank pain, no hematuria   MS:  No joint pain or swelling.  No decreased range of motion.  No back pain.    Physical Exam  BP 112/78 (BP Location: Right Arm, Patient Position: Sitting, Cuff Size: Normal)   Pulse 67   Ht 5\' 6"  (1.676 m)   Wt 220 lb (99.8 kg)   SpO2 92%   BMI 35.51 kg/m G  GEN: A/Ox3; pleasant , NAD, obese    HEENT:  [place]/AT,  EACs-clear, TMs-wnl, NOSE-clear, THROAT-clear, no lesions, no postnasal drip or exudate noted.   NECK:  Supple w/ fair ROM; no JVD; normal carotid  impulses w/o bruits; no thyromegaly or nodules palpated; no lymphadenopathy.    RESP  Clear  P & A; w/o, wheezes/ rales/ or rhonchi. no accessory muscle use, no dullness to percussion  CARD:  RRR, no m/r/g,tr  peripheral edema, pulses intact, no cyanosis or clubbing.  GI:   Soft & nt; nml bowel sounds; no organomegaly or masses detected.   Musco: Warm bil, no deformities or joint swelling noted.   Neuro: alert, no focal  deficits noted.    Skin: Warm, no lesions or rashes    Lab Results:  CBC  BMET   BNP No results found for: BNP  ProBNP    Component Value Date/Time   PROBNP 86.0 12/26/2016 1208    Imaging: No results found.   Assessment & Plan:   Chronic cough Moderate restriction on Spirometry - may have component of COPD but no sign airflow obstruction noted.  Improving with tx aimed at [place] /GERD tx .  HRCT chest with mild scarring ?early mild NSIP  Will check PFT w/ DLCO on return  He is improved on current regeimn  Cont to follow .  No change in inhalers for now , check PFT then decide on whether to continue or not.   OSA on CPAP ONO -No desats on CPAP  Cont on CPAP      Rexene Edison, NP 02/06/2017

## 2017-02-06 NOTE — Assessment & Plan Note (Signed)
ONO -No desats on CPAP  Cont on CPAP

## 2017-02-06 NOTE — Patient Instructions (Addendum)
Continue on ARNUITY and ANORO daily . , rinse after use.  Delsym 2 tsp Twice daily for cough .  Mucinex Twice daily  As needed  Congestion .  Zyrtec 10mg  daily As needed  Drainage  Use Saline nasal spray/rinse Twice daily  .rpn   Nasacort 2 puff daily  Follow up Dr. [place] Bells as planned and As needed   Please contact office for sooner follow up if symptoms do not improve or worsen or seek emergency care

## 2017-02-06 NOTE — Assessment & Plan Note (Addendum)
Moderate restriction on Spirometry - may have component of COPD but no sign airflow obstruction noted.  Improving with tx aimed at [place] /GERD tx .  HRCT chest with mild scarring ?early mild NSIP  Will check PFT w/ DLCO on return  He is improved on current regeimn  Cont to follow .  No change in inhalers for now , check PFT then decide on whether to continue or not.

## 2017-02-06 NOTE — Addendum Note (Signed)
Addended by: Valerie Salts on: 02/06/2017 03:12 PM   Modules accepted: Orders

## 2017-02-12 NOTE — Progress Notes (Signed)
Reviewed, agree 

## 2017-03-08 ENCOUNTER — Ambulatory Visit: Payer: Medicare Other | Admitting: Pulmonary Disease

## 2017-04-18 ENCOUNTER — Other Ambulatory Visit: Payer: Self-pay | Admitting: Neurology

## 2017-04-30 ENCOUNTER — Encounter: Payer: Self-pay | Admitting: Neurology

## 2017-05-01 ENCOUNTER — Ambulatory Visit: Payer: Medicare Other | Admitting: Neurology

## 2017-05-01 ENCOUNTER — Encounter: Payer: Self-pay | Admitting: Neurology

## 2017-05-01 VITALS — BP 140/76 | HR 65 | Ht 67.0 in | Wt 223.0 lb

## 2017-05-01 DIAGNOSIS — Z85850 Personal history of malignant neoplasm of thyroid: Secondary | ICD-10-CM | POA: Diagnosis not present

## 2017-05-01 DIAGNOSIS — G4730 Sleep apnea, unspecified: Secondary | ICD-10-CM | POA: Diagnosis not present

## 2017-05-01 DIAGNOSIS — E669 Obesity, unspecified: Secondary | ICD-10-CM | POA: Diagnosis not present

## 2017-05-01 NOTE — Progress Notes (Signed)
[name]   Provider:  Larey Seat, M D  Referring Provider: [name], MD Primary Care Physician:  [name], MD  Chief Complaint  Patient presents with  . Follow-up    pt alone, rm 10. pt states that CPAP is working well for him. DME: [facility]    HPI:  [name] is a 76 y.o. male  Is seen here as a  revisit  from Dr. [name] for CPAP compliance.  I have the pleasure of meeting with Ms. [name] today on 01 May 2017.  He has again been a very compliant CPAP use of his 100% of the last 30 days daily use and 80% by hours.  Average use at times 4 hours 42 minutes, the patient uses an AutoSet between 12 and 18 cmH2O was wanted me to EPR is a residual AHI of 11.1.  The vast majority of these residual apneas are obstructive in nature usually means that he does need a little bit more pressure.  The 95th percentile pressure was 17.5 cm water.  I would like to increase the maximum pressure to 20 cm was 2 cm EPR.  He will stay on the memory foam lined mask the so-called air touch interface.  He likes it and it has reduced air leaks significantly.  He reports that his spouse still feels that there is too much air leak however it has not woken him up she is waking him up.  His fatigue severity score today was endorsed to 22 points but his Epworth sleepiness score is still high at 15 points, the geriatric depression score was endorsed at only 1 out of 15 points. He is happy with his results, more alertness.     10-30-2016, [name] has been, as usual, and excellent compliant patient. He has used CPAP was 90% compliance and an average of 5 hours and 90 minutes daily, uses an AutoSet between 12 and 18 cm water was 1 cm EPR and hasn't residual apnea index of 5.8. This is a little higher than I like it's but I think it is related to higher air leaks. The 95th percentile pressure was 17.1, which is an unusual high pressure needs. The air leaks were  remarkably high. The patient sometimes is aware at night that there is an air leak but not every night I would like for him to be refitted to a different interface. I would like him to try an air touch fullface mask, which has no resting point on the forehead. He also endorsed 16 points on his Epworth Sleepiness Scale which is too high, and 43 points on the fatigue. His geriatric depression score on the other hand was only endorsed at one out of 15 points. He continues to use multiple medications including ropinirole 1 mg in the morning and 4 mg at night for restless leg control. Carbidopa levodopa2 times a day for RLS, he also takes metoprolol, levo-thyroxin,  Rizatriptan,  and allopurinol. Various over-the-counter medication-nutritional supplements. RLS goes away after sinemet intake. 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  Revisit note from 07/01/2015. I me today with [name], who has an excellent compliance on his CPAP  S 9 machine. We interrogated the machine by wife 5 today here in the office and it revealed that his AHI is 6.7 with 100% compliance for days of use and an average user time of 6 hours and 6 minutes. He is using an AutoSet between 12 and  18 cm water pressure was 1 cm EPR the 95th percentile pressure is 16. He does have sometimes significant air leaks that the mask leak he is using a full face mask as evident by the pressure marks at least on his face.he cannot tolerate nasal airflow.   He has bronchitic breathing and has been short of air while talking.  His gout remains well controlled, but her mentioned how expensive his medications are , needed to achieve this control.  He reports restless legs and filled the Johns-Hopkins Questionnaire for quality of life in RLS.  The patient has been long time established, and  underwent a split study in 2011 at [facility]. His AHI was 77 per hour and he was titrated to 16 cm water pressure with a 3 cm EPR. The residual AHI became  lower than 5. A download reorder from August 2013 showed 100% compliance. I was unable today to obtain the data for 2014 but asked [name] 'es DME - [facility] here in [place]) to provide [place] with the most recent data. He got this download per [name] today.  Today the download shows been 96% compliance over the last 90 days, average time of CPAP use per day at 6 hours and 52 minutes, residual AHI is 6.9 and the sac pressure is 16 cm water with 3 cm EPR there is a moderately high air leak, the pressure is so high his mask blows off his face. He needs a new mask and headgear soon.  I would like for him to have an auto-titrator machine with a setting from 8 through 16 cm water . He states he never sleeps without his CPAP and likes the machines effect on his sleep, rest, agility.  He has 2- 3 nocturia breaks interrupting his sleep. Bedtime is between 23 hours and midnight. Subjective getting 7-8 hours of sleep , wakes up spontaneously at 7 AM.         Review of Systems: Out of a complete 14 system review, the patient complains of only the following symptoms, and all other reviewed systems are negative.  SOB, fatigue reduced. GDS one points,  How likely are you to doze in the following situations: 0 = not likely, 1 = slight chance, 2 = moderate chance, 3 = high chance  Sitting and Reading? 2 Watching Television?3 Sitting inactive in a public place (theater or meeting)?2 Lying down in the afternoon when circumstances permit?2 Sitting and talking to someone?1 Sitting quietly after lunch without alcohol?3 In a car, while stopped for a few minutes in traffic?0 As a passenger in a car for an hour without a break?1  Total =17 before CPAP- now 15 .    FSS 22 points, shoulder pain keeps him from sleeping.    He adjusted his diet , his wife no longer cooks fried foods, more whole wheat , salads.  His son is a Child psychotherapist and supplies the couple with whole wheat .   Breakfast with caffeine , 2-3 cups of  coffee. Non smoker,  Non drinker.      Social History   Socioeconomic History  . Marital status: Married    Spouse name: [name]  . Number of children: 2  . Years of education: COLLEGE  . Highest education level: Not on file  Social Needs  . Financial resource strain: Not on file  . Food insecurity - worry: Not on file  . Food insecurity - inability: Not on file  . Transportation needs - medical: Not  on file  . Transportation needs - non-medical: Not on file  Occupational History    Employer: RETIRED  Tobacco Use  . Smoking status: Former Smoker    Years: 40.00    Types: Cigars, Cigarettes    Last attempt to quit: 06/19/2005    Years since quitting: 11.8  . Smokeless tobacco: Never Used  . Tobacco comment: smoked 4-6 cigars daily, only smoked cigarettes socially  Substance and Sexual Activity  . Alcohol use: No  . Drug use: No  . Sexual activity: Not on file  Other Topics Concern  . Not on file  Social History Narrative   Patient is married [name]) and lives at home with his wife.   Patient has two children.   Patient is retired.   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   Patient is right-handed.   Patient drinks three cups of coffee per day.    Family History  Problem Relation Age of Onset  . COPD Mother   . Hyperlipidemia Mother   . Hypertension Mother   . Diabetes Father   . Kidney disease Father        ESRD  . Hypertension Father   . Cancer Father   . Hyperlipidemia Father     Past Medical History:  Diagnosis Date  . Barrett esophagus   . CAD (coronary artery disease)   . Cancer ([place])    thyroid  . Carotid artery occlusion   . Chronic kidney disease   . COPD (chronic obstructive pulmonary disease) ([place])   . Diabetes mellitus without complication ([place])   . GERD (gastroesophageal reflux disease)   . History of thyroid cancer 2008  . History of thyroid cancer   . Hypertension   . OSA (obstructive sleep apnea)   . Restless leg    . Sleep apnea with use of continuous positive airway pressure (CPAP)    2011 [facility] , AHI  77cn central and obstrcutive. 16 cm water , 3 cm EPR.   . Thyroid disease   . Ulcer    Peptic ulcer disease    Past Surgical History:  Procedure Laterality Date  . CAROTID ENDARTERECTOMY Left January 03, 2006   Dr. Amedeo Plenty  . JOINT REPLACEMENT Left    knee X 2  . THYROIDECTOMY  2008    Current Outpatient Medications  Medication Sig Dispense Refill  . allopurinol (ZYLOPRIM) 300 MG tablet Take 300 mg by mouth daily.   12  . ANORO ELLIPTA 62.5-25 MCG/INH AEPB Inhale 1 puff into the lungs daily. 1 each 6  . ARNUITY ELLIPTA 100 MCG/ACT AEPB Inhale 1 puff into the lungs daily. 30 each 6  . aspirin 325 MG tablet Take 325 mg by mouth daily.    . betamethasone dipropionate (DIPROLENE) 0.05 % cream Apply topically 2 (two) times daily.    . calcium citrate-vitamin D (CITRACAL+D) 315-200 MG-UNIT per tablet Take 1 tablet by mouth 2 (two) times daily. 750 mg    . carbidopa-levodopa (SINEMET IR) 10-100 MG tablet Take 1 tablet by mouth 2 (two) times daily. 90 tablet 4  . Cholecalciferol (VITAMIN D3) 2000 UNITS TABS Take 1 tablet by mouth daily.    . cloNIDine (CATAPRES) 0.1 MG tablet Take 0.1 mg by mouth 2 (two) times daily.  8  . COLCRYS 0.6 MG tablet Take 0.6 mg by mouth daily.    . fish oil-omega-3 fatty acids 1000 MG capsule Take 3 g by mouth daily.     [name]  gabapentin (NEURONTIN) 300 MG capsule Take 300 mg by mouth 2 (two) times daily.  4  . HYDROcodone-acetaminophen (NORCO/VICODIN) 5-325 MG per tablet Take 1-2 tablets by mouth every 4 (four) hours as needed.     [name] levothyroxine (SYNTHROID, LEVOTHROID) 200 MCG tablet Take 200 mcg by mouth daily before breakfast.     . losartan-hydrochlorothiazide (HYZAAR) 100-25 MG per tablet Take 1 tablet by mouth daily.    . metformin (FORTAMET) 500 MG (OSM) 24 hr tablet Take 500 mg by mouth 2 (two) times daily with a meal.     . Multiple Vitamin (MULTIVITAMIN)  tablet Take 1 tablet by mouth daily.    . nitroGLYCERIN (NITROSTAT) 0.4 MG SL tablet Place 0.4 mg every 5 (five) minutes as needed under the tongue for chest pain.    [name] oxymetazoline (AFRIN) 0.05 % nasal spray Place 2 sprays into the nose 2 (two) times daily.    [name] Respiratory Therapy Supplies (FLUTTER) DEVI Use as directed 1 each 0  . rizatriptan (MAXALT) 10 MG tablet Take 10 mg by mouth as needed for migraine. May repeat in 2 hours if needed    . rOPINIRole (REQUIP) 1 MG tablet TAKE 1 TABLET BY MOUTH IN THE MORNING AND 4 TABLETS AT BEDTIME 150 tablet 1  . simvastatin (ZOCOR) 20 MG tablet Take 20 mg by mouth every evening.    . TRAVATAN Z 0.004 % SOLN ophthalmic solution Place 1 drop into both eyes.     . VENTOLIN HFA 108 (90 Base) MCG/ACT inhaler Inhale 1-2 puffs into the lungs every 4 (four) hours as needed.   6   No current facility-administered medications for this visit.     Allergies as of 05/01/2017  . (No Known Allergies)    Vitals: BP 140/76   Pulse 65   Ht 5\' 7"  (1.702 m)   Wt 223 lb (101.2 kg)   BMI 34.93 kg/m  Last Weight:  Wt Readings from Last 1 Encounters:  05/01/17 223 lb (101.2 kg)   Last Height:   Ht Readings from Last 1 Encounters:  05/01/17 5\' 7"  (1.702 m)    Physical exam:  General: The patient is awake, alert and appears not in acute distress. The patient is well groomed. Head: Normocephalic, atraumatic. Neck is supple. Mallampati 1 wide open- very red upper airway( GERD )with a  neck circumference of  20 inches ,  no nasal deviation, but chronic tendency to have nasal airflow obstruction-rhinitis. Tonsillectomy at age 16.  Cardiovascular:  Regular rate and rhythm ,   Neurologic exam : The patient is awake and alert, oriented to place and time.   Cranial nerves: No change in taste or smell= Pupils are equal and briskly reactive to light. Hearing to finger rub intact bilaterally . Facial sensation intact to fine touch.  Facial motor strength is  symmetric, tongue and uvula move midline.  Motor exam:  No cogwheeling , no tremor.   Deep tendon reflexes: in the  upper and lower extremities are symmetric and intact. Babinski maneuver response is  downgoing.   Assessment:  Visit furation 25 minutes with Epworth, CPAP compliance and depression score as well as  fatigue questionnaire .  Interrogation of CPAP- auto setting. 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   High air leaks, full face mask is the only tolerated mask however.  In office and at [facility] , CPAP download obtained. AHi is  Up to 11.1 /hr. From  7.8 - residual AHI all  obstructive- will increase auto pressure window to 20 cm with 2 cm EPR> I am happy with his compliance for 30 days at 80% .    Plan:  Changed settings, RV with NP in 3 month.    Cc Dr. [name]

## 2017-06-08 ENCOUNTER — Other Ambulatory Visit: Payer: Self-pay | Admitting: Neurology

## 2017-06-20 ENCOUNTER — Encounter (HOSPITAL_COMMUNITY): Payer: Self-pay

## 2017-06-20 ENCOUNTER — Other Ambulatory Visit: Payer: Self-pay

## 2017-06-20 ENCOUNTER — Observation Stay (HOSPITAL_COMMUNITY)
Admission: EM | Admit: 2017-06-20 | Discharge: 2017-06-23 | Disposition: A | Discharge status: Skilled Nursing Facility | Payer: Medicare Other | Attending: Infectious Disease | Admitting: Infectious Disease

## 2017-06-20 ENCOUNTER — Emergency Department (HOSPITAL_COMMUNITY): Payer: Medicare Other

## 2017-06-20 DIAGNOSIS — Z87440 Personal history of urinary (tract) infections: Secondary | ICD-10-CM | POA: Insufficient documentation

## 2017-06-20 DIAGNOSIS — G4733 Obstructive sleep apnea (adult) (pediatric): Secondary | ICD-10-CM | POA: Insufficient documentation

## 2017-06-20 DIAGNOSIS — Z8249 Family history of ischemic heart disease and other diseases of the circulatory system: Secondary | ICD-10-CM | POA: Insufficient documentation

## 2017-06-20 DIAGNOSIS — I452 Bifascicular block: Secondary | ICD-10-CM | POA: Insufficient documentation

## 2017-06-20 DIAGNOSIS — L8990 Pressure ulcer of unspecified site, unspecified stage: Secondary | ICD-10-CM | POA: Diagnosis not present

## 2017-06-20 DIAGNOSIS — K219 Gastro-esophageal reflux disease without esophagitis: Secondary | ICD-10-CM | POA: Diagnosis not present

## 2017-06-20 DIAGNOSIS — Z79899 Other long term (current) drug therapy: Secondary | ICD-10-CM | POA: Diagnosis not present

## 2017-06-20 DIAGNOSIS — Z9889 Other specified postprocedural states: Secondary | ICD-10-CM | POA: Insufficient documentation

## 2017-06-20 DIAGNOSIS — I2510 Atherosclerotic heart disease of native coronary artery without angina pectoris: Secondary | ICD-10-CM | POA: Diagnosis not present

## 2017-06-20 DIAGNOSIS — Z87891 Personal history of nicotine dependence: Secondary | ICD-10-CM | POA: Insufficient documentation

## 2017-06-20 DIAGNOSIS — Z79890 Hormone replacement therapy (postmenopausal): Secondary | ICD-10-CM | POA: Diagnosis not present

## 2017-06-20 DIAGNOSIS — Z7984 Long term (current) use of oral hypoglycemic drugs: Secondary | ICD-10-CM | POA: Insufficient documentation

## 2017-06-20 DIAGNOSIS — R531 Weakness: Secondary | ICD-10-CM

## 2017-06-20 DIAGNOSIS — Z841 Family history of disorders of kidney and ureter: Secondary | ICD-10-CM | POA: Insufficient documentation

## 2017-06-20 DIAGNOSIS — Z7951 Long term (current) use of inhaled steroids: Secondary | ICD-10-CM | POA: Insufficient documentation

## 2017-06-20 DIAGNOSIS — E890 Postprocedural hypothyroidism: Secondary | ICD-10-CM | POA: Diagnosis not present

## 2017-06-20 DIAGNOSIS — I129 Hypertensive chronic kidney disease with stage 1 through stage 4 chronic kidney disease, or unspecified chronic kidney disease: Secondary | ICD-10-CM | POA: Diagnosis not present

## 2017-06-20 DIAGNOSIS — R0902 Hypoxemia: Secondary | ICD-10-CM | POA: Insufficient documentation

## 2017-06-20 DIAGNOSIS — G2581 Restless legs syndrome: Secondary | ICD-10-CM | POA: Diagnosis present

## 2017-06-20 DIAGNOSIS — J449 Chronic obstructive pulmonary disease, unspecified: Secondary | ICD-10-CM | POA: Diagnosis not present

## 2017-06-20 DIAGNOSIS — Z66 Do not resuscitate: Secondary | ICD-10-CM | POA: Insufficient documentation

## 2017-06-20 DIAGNOSIS — E860 Dehydration: Secondary | ICD-10-CM | POA: Insufficient documentation

## 2017-06-20 DIAGNOSIS — G20 Parkinson's disease: Secondary | ICD-10-CM | POA: Insufficient documentation

## 2017-06-20 DIAGNOSIS — N183 Chronic kidney disease, stage 3 (moderate): Secondary | ICD-10-CM | POA: Insufficient documentation

## 2017-06-20 DIAGNOSIS — Z85850 Personal history of malignant neoplasm of thyroid: Secondary | ICD-10-CM | POA: Diagnosis not present

## 2017-06-20 DIAGNOSIS — Z9989 Dependence on other enabling machines and devices: Secondary | ICD-10-CM

## 2017-06-20 DIAGNOSIS — Z833 Family history of diabetes mellitus: Secondary | ICD-10-CM | POA: Insufficient documentation

## 2017-06-20 DIAGNOSIS — J9621 Acute and chronic respiratory failure with hypoxia: Secondary | ICD-10-CM

## 2017-06-20 DIAGNOSIS — M109 Gout, unspecified: Secondary | ICD-10-CM | POA: Insufficient documentation

## 2017-06-20 DIAGNOSIS — Z8349 Family history of other endocrine, nutritional and metabolic diseases: Secondary | ICD-10-CM | POA: Insufficient documentation

## 2017-06-20 DIAGNOSIS — Z7982 Long term (current) use of aspirin: Secondary | ICD-10-CM | POA: Insufficient documentation

## 2017-06-20 DIAGNOSIS — B9689 Other specified bacterial agents as the cause of diseases classified elsewhere: Secondary | ICD-10-CM | POA: Diagnosis not present

## 2017-06-20 DIAGNOSIS — E1122 Type 2 diabetes mellitus with diabetic chronic kidney disease: Secondary | ICD-10-CM | POA: Insufficient documentation

## 2017-06-20 DIAGNOSIS — N3000 Acute cystitis without hematuria: Secondary | ICD-10-CM | POA: Diagnosis not present

## 2017-06-20 DIAGNOSIS — I4510 Unspecified right bundle-branch block: Secondary | ICD-10-CM | POA: Insufficient documentation

## 2017-06-20 DIAGNOSIS — Z96652 Presence of left artificial knee joint: Secondary | ICD-10-CM | POA: Insufficient documentation

## 2017-06-20 DIAGNOSIS — I6529 Occlusion and stenosis of unspecified carotid artery: Secondary | ICD-10-CM | POA: Insufficient documentation

## 2017-06-20 DIAGNOSIS — Z6833 Body mass index (BMI) 33.0-33.9, adult: Secondary | ICD-10-CM | POA: Insufficient documentation

## 2017-06-20 DIAGNOSIS — Z836 Family history of other diseases of the respiratory system: Secondary | ICD-10-CM | POA: Insufficient documentation

## 2017-06-20 LAB — GLUCOSE, CAPILLARY
Glucose-Capillary: 89 mg/dL (ref 65–99)
Glucose-Capillary: 135 mg/dL — ABNORMAL HIGH (ref 65–99)

## 2017-06-20 LAB — I-STAT TROPONIN, ED: TROPONIN I, POC: 0 ng/mL (ref 0.00–0.08)

## 2017-06-20 LAB — URINALYSIS, MICROSCOPIC (REFLEX)

## 2017-06-20 LAB — BASIC METABOLIC PANEL
ANION GAP: 12 (ref 5–15)
BUN: 29 mg/dL — ABNORMAL HIGH (ref 6–20)
CHLORIDE: 101 mmol/L (ref 101–111)
CO2: 25 mmol/L (ref 22–32)
Calcium: 9.7 mg/dL (ref 8.9–10.3)
Creatinine, Ser: 0.9 mg/dL (ref 0.61–1.24)
GFR calc Af Amer: >60 mL/min (ref >60)
GFR calc non Af Amer: >60 mL/min (ref >60)
GLUCOSE: 114 mg/dL — AB (ref 65–99)
POTASSIUM: 3.7 mmol/L (ref 3.5–5.1)
Sodium: 138 mmol/L (ref 135–145)

## 2017-06-20 LAB — URINALYSIS, ROUTINE W REFLEX MICROSCOPIC
BILIRUBIN URINE: NEGATIVE
GLUCOSE, UA: NEGATIVE mg/dL
Ketones, ur: NEGATIVE mg/dL
NITRITE: POSITIVE — AB
PH: 5.5 (ref 5.0–8.0)
Protein, ur: NEGATIVE mg/dL
SPECIFIC GRAVITY, URINE: 1.02 (ref 1.005–1.030)

## 2017-06-20 LAB — CBC
HEMATOCRIT: 42.5 % (ref 39.0–52.0)
HEMOGLOBIN: 13.8 g/dL (ref 13.0–17.0)
MCH: 27.9 pg (ref 26.0–34.0)
MCHC: 32.5 g/dL (ref 30.0–36.0)
MCV: 85.9 fL (ref 78.0–100.0)
Platelets: 293 10*3/uL (ref 150–400)
RBC: 4.95 MIL/uL (ref 4.22–5.81)
RDW: 15.1 % (ref 11.5–15.5)
WBC: 11.7 10*3/uL — ABNORMAL HIGH (ref 4.0–10.5)

## 2017-06-20 IMAGING — DX DG CHEST 2V
2 series · 2 of 2 positions shown · non-contrast
Comparison: Chest x-ray [DATE].

CLINICAL DATA: 76-year-old male with measures a congestion,
weakness and fever, with worsening weakness.

EXAM:
CHEST  2 VIEW

[x chest ap]
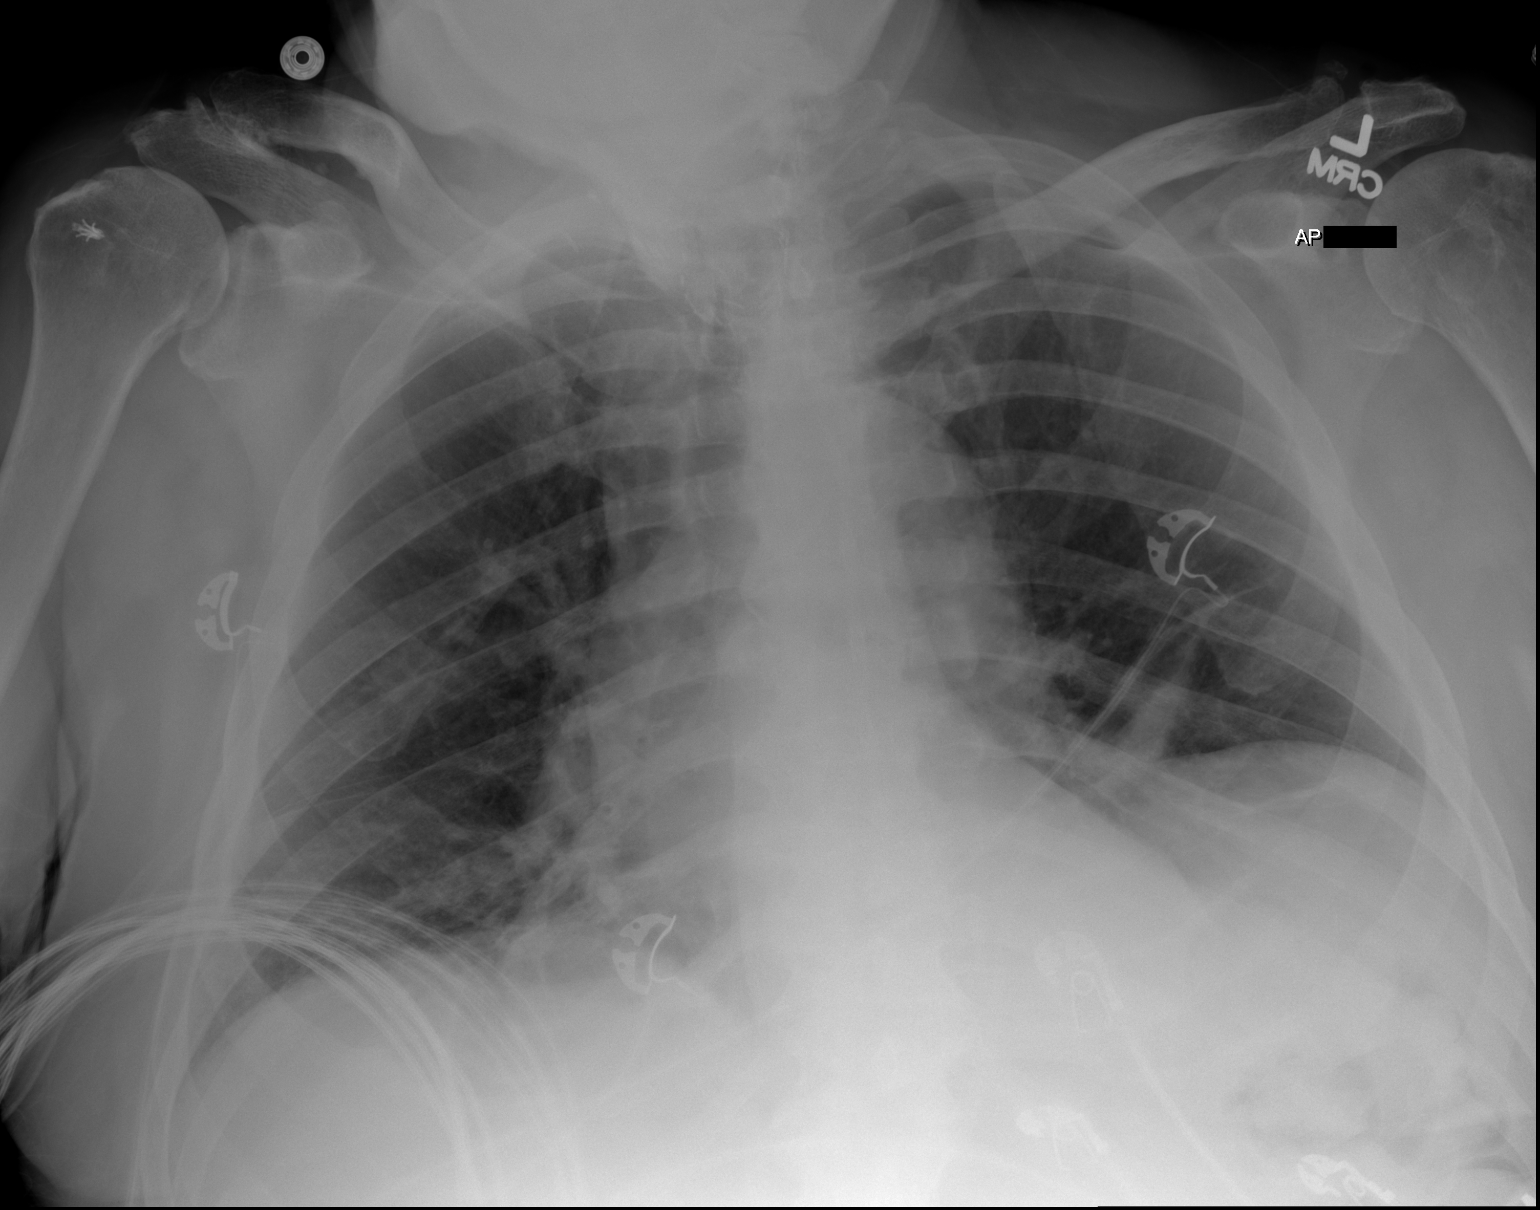

[w chest lat]
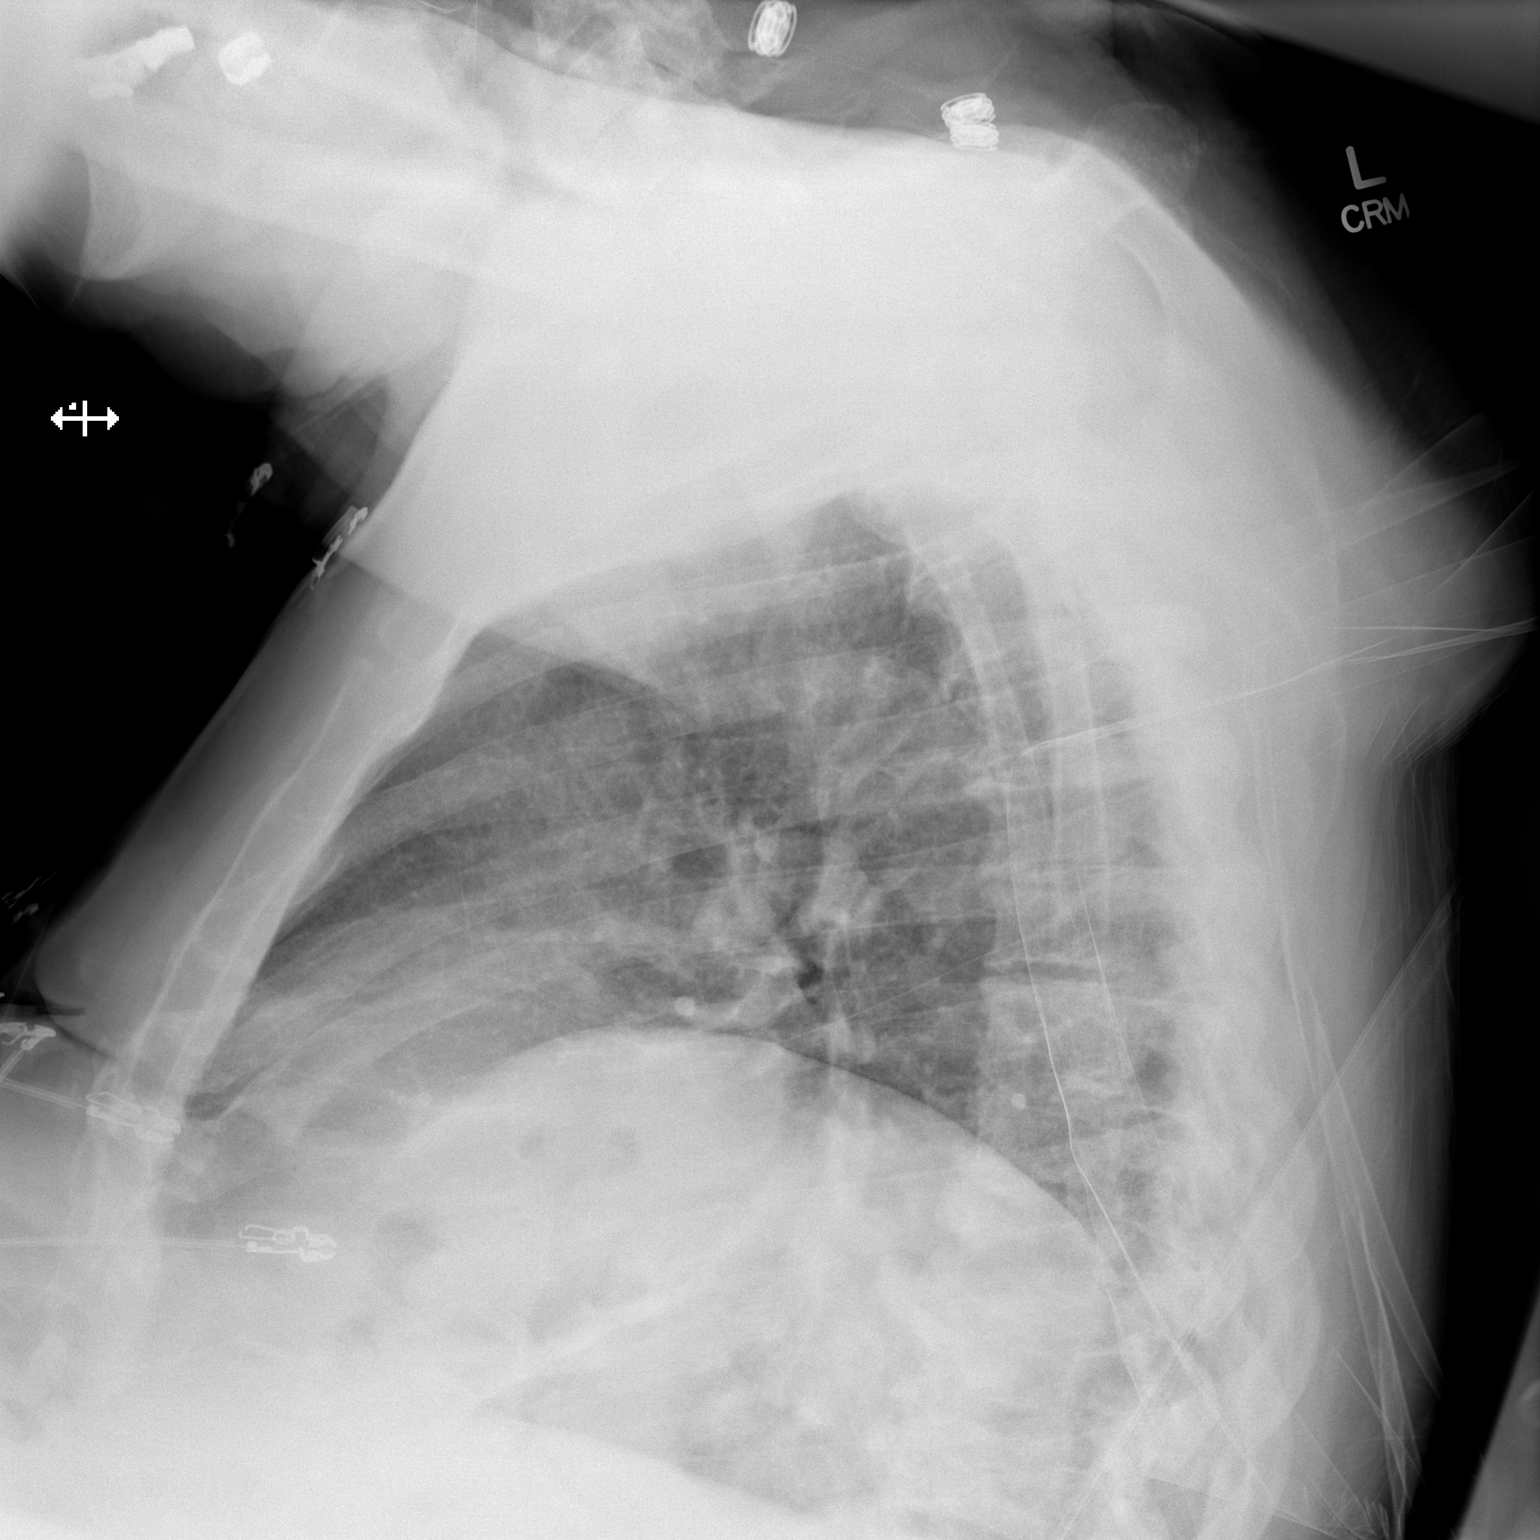

[2 of 2 positions shown; findings below may reference images not displayed]

FINDINGS: Low lung volumes. Linear scarring in the left lower lobe, similar to
prior study from [[facility]]. No acute consolidative airspace disease. No
pleural effusions. No evidence of pulmonary edema. No definite
suspicious appearing pulmonary nodules or masses. Heart size is
normal. The patient is rotated to the right on today's exam,
resulting in distortion of the mediastinal contours and reduced
diagnostic sensitivity and specificity for mediastinal pathology.
IMPRESSION: 1. Low lung volumes without radiographic evidence of acute
cardiopulmonary disease.
2. Persistent scarring the left lower lobe, similar to prior study

## 2017-06-20 MED ORDER — ASPIRIN 325 MG PO TABS
325.0000 mg | ORAL_TABLET | Freq: Every day | ORAL | Status: DC
  Administered 2017-06-21 – 2017-06-23 (×3): 325 mg via ORAL
  Filled 2017-06-20 (×3): qty 1

## 2017-06-20 MED ORDER — UMECLIDINIUM-VILANTEROL 62.5-25 MCG/INH IN AEPB
1.0000 | INHALATION_SPRAY | Freq: Every day | RESPIRATORY_TRACT | Status: DC
  Administered 2017-06-21 – 2017-06-23 (×3): 1 via RESPIRATORY_TRACT
  Filled 2017-06-20: qty 14

## 2017-06-20 MED ORDER — COLCHICINE 0.6 MG PO TABS
0.6000 mg | ORAL_TABLET | Freq: Every day | ORAL | Status: DC
  Administered 2017-06-21 – 2017-06-23 (×3): 0.6 mg via ORAL
  Filled 2017-06-20 (×3): qty 1

## 2017-06-20 MED ORDER — METOPROLOL TARTRATE 50 MG PO TABS
50.0000 mg | ORAL_TABLET | Freq: Two times a day (BID) | ORAL | Status: DC
  Administered 2017-06-20 – 2017-06-23 (×6): 50 mg via ORAL
  Filled 2017-06-20 (×6): qty 1

## 2017-06-20 MED ORDER — FLUTICASONE FUROATE 200 MCG/ACT IN AEPB: 200.0000 ug | INHALATION_SPRAY | Freq: Every day | RESPIRATORY_TRACT | Status: DC

## 2017-06-20 MED ORDER — ENOXAPARIN SODIUM 40 MG/0.4ML ~~LOC~~ SOLN
40.0000 mg | SUBCUTANEOUS | Status: DC
  Administered 2017-06-20 – 2017-06-22 (×3): 40 mg via SUBCUTANEOUS
  Filled 2017-06-20 (×3): qty 0.4

## 2017-06-20 MED ORDER — LEVOTHYROXINE SODIUM 100 MCG PO TABS
200.0000 ug | ORAL_TABLET | Freq: Every day | ORAL | Status: DC
  Administered 2017-06-21 – 2017-06-23 (×3): 200 ug via ORAL
  Filled 2017-06-20 (×3): qty 2

## 2017-06-20 MED ORDER — LOSARTAN POTASSIUM-HCTZ 100-25 MG PO TABS: 1.0000 | ORAL_TABLET | Freq: Every day | ORAL | Status: DC

## 2017-06-20 MED ORDER — INSULIN ASPART 100 UNIT/ML ~~LOC~~ SOLN
0.0000 [IU] | Freq: Three times a day (TID) | SUBCUTANEOUS | Status: DC
  Administered 2017-06-22 – 2017-06-23 (×2): 1 [IU] via SUBCUTANEOUS

## 2017-06-20 MED ORDER — POTASSIUM CHLORIDE CRYS ER 20 MEQ PO TBCR
20.0000 meq | EXTENDED_RELEASE_TABLET | Freq: Every day | ORAL | Status: DC
  Administered 2017-06-21 – 2017-06-23 (×3): 20 meq via ORAL
  Filled 2017-06-20 (×3): qty 1

## 2017-06-20 MED ORDER — LOSARTAN POTASSIUM 50 MG PO TABS
100.0000 mg | ORAL_TABLET | Freq: Every day | ORAL | Status: DC
  Administered 2017-06-21 – 2017-06-23 (×3): 100 mg via ORAL
  Filled 2017-06-20 (×3): qty 2

## 2017-06-20 MED ORDER — NITROGLYCERIN 0.4 MG SL SUBL: 0.4000 mg | SUBLINGUAL_TABLET | SUBLINGUAL | Status: DC | PRN

## 2017-06-20 MED ORDER — CARBIDOPA-LEVODOPA 10-100 MG PO TABS
1.0000 | ORAL_TABLET | Freq: Two times a day (BID) | ORAL | Status: DC
  Administered 2017-06-20 – 2017-06-23 (×6): 1 via ORAL
  Filled 2017-06-20 (×6): qty 1

## 2017-06-20 MED ORDER — HYDROCHLOROTHIAZIDE 25 MG PO TABS
25.0000 mg | ORAL_TABLET | Freq: Every day | ORAL | Status: DC
  Administered 2017-06-21 – 2017-06-23 (×3): 25 mg via ORAL
  Filled 2017-06-20 (×3): qty 1

## 2017-06-20 MED ORDER — CALCIUM CARBONATE-VITAMIN D 500-200 MG-UNIT PO TABS
1.0000 | ORAL_TABLET | Freq: Every day | ORAL | Status: DC
  Administered 2017-06-21 – 2017-06-23 (×3): 1 via ORAL
  Filled 2017-06-20 (×3): qty 1

## 2017-06-20 MED ORDER — DEXTROSE 5 % IV SOLN
1.0000 g | INTRAVENOUS | Status: DC
  Administered 2017-06-21: 1 g via INTRAVENOUS
  Filled 2017-06-20 (×2): qty 10

## 2017-06-20 MED ORDER — LATANOPROST 0.005 % OP SOLN
1.0000 [drp] | Freq: Every day | OPHTHALMIC | Status: DC
  Administered 2017-06-20 – 2017-06-22 (×3): 1 [drp] via OPHTHALMIC
  Filled 2017-06-20: qty 2.5

## 2017-06-20 MED ORDER — OMEGA-3 FATTY ACIDS 1000 MG PO CAPS: 3.0000 g | ORAL_CAPSULE | Freq: Every day | ORAL | Status: DC

## 2017-06-20 MED ORDER — DEXTROSE 5 % IV SOLN
1.0000 g | Freq: Once | INTRAVENOUS | Status: AC
  Administered 2017-06-20: 1 g via INTRAVENOUS
  Filled 2017-06-20: qty 10

## 2017-06-20 MED ORDER — CLONIDINE HCL 0.1 MG PO TABS
0.1000 mg | ORAL_TABLET | Freq: Two times a day (BID) | ORAL | Status: DC
  Administered 2017-06-20 – 2017-06-23 (×6): 0.1 mg via ORAL
  Filled 2017-06-20 (×6): qty 1

## 2017-06-20 MED ORDER — SIMVASTATIN 20 MG PO TABS
20.0000 mg | ORAL_TABLET | Freq: Every evening | ORAL | Status: DC
  Administered 2017-06-20 – 2017-06-22 (×3): 20 mg via ORAL
  Filled 2017-06-20 (×3): qty 1

## 2017-06-20 MED ORDER — FLUTICASONE-UMECLIDIN-VILANT 100-62.5-25 MCG/INH IN AEPB: 1.0000 | INHALATION_SPRAY | Freq: Every day | RESPIRATORY_TRACT | Status: DC

## 2017-06-20 MED ORDER — SODIUM CHLORIDE 0.9 % IV SOLN
INTRAVENOUS | Status: AC
  Administered 2017-06-20: 18:00:00 via INTRAVENOUS

## 2017-06-20 MED ORDER — ACETAMINOPHEN 650 MG RE SUPP: 650.0000 mg | Freq: Four times a day (QID) | RECTAL | Status: DC | PRN

## 2017-06-20 MED ORDER — PANTOPRAZOLE SODIUM 40 MG PO TBEC
40.0000 mg | DELAYED_RELEASE_TABLET | Freq: Every evening | ORAL | Status: DC
  Administered 2017-06-20 – 2017-06-22 (×3): 40 mg via ORAL
  Filled 2017-06-20 (×3): qty 1

## 2017-06-20 MED ORDER — CALCIUM CITRATE-VITAMIN D 315-200 MG-UNIT PO TABS: 1.0000 | ORAL_TABLET | Freq: Every day | ORAL | Status: DC

## 2017-06-20 MED ORDER — ADULT MULTIVITAMIN W/MINERALS CH
1.0000 | ORAL_TABLET | Freq: Every day | ORAL | Status: DC
  Administered 2017-06-21 – 2017-06-23 (×3): 1 via ORAL
  Filled 2017-06-20 (×4): qty 1

## 2017-06-20 MED ORDER — ACETAMINOPHEN 325 MG PO TABS: 650.0000 mg | ORAL_TABLET | Freq: Four times a day (QID) | ORAL | Status: DC | PRN

## 2017-06-20 MED ORDER — OMEGA-3-ACID ETHYL ESTERS 1 G PO CAPS
3.0000 g | ORAL_CAPSULE | Freq: Every day | ORAL | Status: DC
  Administered 2017-06-21 – 2017-06-23 (×3): 3 g via ORAL
  Filled 2017-06-20 (×3): qty 3

## 2017-06-20 MED ORDER — ALLOPURINOL 300 MG PO TABS
300.0000 mg | ORAL_TABLET | Freq: Every day | ORAL | Status: DC
  Administered 2017-06-21 – 2017-06-23 (×3): 300 mg via ORAL
  Filled 2017-06-20 (×3): qty 1

## 2017-06-20 MED ORDER — ROPINIROLE HCL 1 MG PO TABS
4.0000 mg | ORAL_TABLET | Freq: Every day | ORAL | Status: DC
  Administered 2017-06-20 – 2017-06-22 (×3): 4 mg via ORAL
  Filled 2017-06-20 (×3): qty 4

## 2017-06-20 MED ORDER — ALBUTEROL SULFATE (2.5 MG/3ML) 0.083% IN NEBU: 2.5000 mg | INHALATION_SOLUTION | RESPIRATORY_TRACT | Status: DC | PRN

## 2017-06-20 MED ORDER — BUDESONIDE 0.5 MG/2ML IN SUSP
0.5000 mg | Freq: Two times a day (BID) | RESPIRATORY_TRACT | Status: DC
  Administered 2017-06-20 – 2017-06-23 (×6): 0.5 mg via RESPIRATORY_TRACT
  Filled 2017-06-20 (×7): qty 2

## 2017-06-20 MED ORDER — HYDROCODONE-ACETAMINOPHEN 5-325 MG PO TABS
1.0000 | ORAL_TABLET | Freq: Three times a day (TID) | ORAL | Status: DC | PRN
  Administered 2017-06-20 – 2017-06-22 (×4): 1 via ORAL
  Filled 2017-06-20 (×4): qty 1

## 2017-06-20 MED ORDER — VITAMIN D3 25 MCG (1000 UNIT) PO TABS
1000.0000 [IU] | ORAL_TABLET | Freq: Every day | ORAL | Status: DC
  Administered 2017-06-21 – 2017-06-23 (×3): 1000 [IU] via ORAL
  Filled 2017-06-20 (×6): qty 1

## 2017-06-20 MED ORDER — ROPINIROLE HCL 1 MG PO TABS
1.0000 mg | ORAL_TABLET | Freq: Every day | ORAL | Status: DC
  Administered 2017-06-21 – 2017-06-23 (×3): 1 mg via ORAL
  Filled 2017-06-20 (×3): qty 1

## 2017-06-20 NOTE — ED Triage Notes (Signed)
Pt reports he has been sick with nasal congestion, weakness, and fever the week before christmas. Over the last couple of days pt reports he has experienced worsening weakness with multiple falls and inability to write d/t weakness in hands. Pt denies weakness to one one side. Denies confusion. A&O x 4.

## 2017-06-20 NOTE — ED Notes (Signed)
Attempted report 

## 2017-06-20 NOTE — H&P (Signed)
History and Physical    [name] ZOX:[id number] DOB: 1941-02-18 DOA: 06/20/2017  PCP: [name], MD   I have briefly reviewed patients previous medical reports in [facility].  Patient coming from: Home  Chief Complaint: Urinary frequency, an episode of high fever, generalized weakness.  HPI: [name] is a married male, mostly independent and at times used a cane for balance, driving until 8-11 days ago, PMH of CAD, thyroid cancer status post thyroidectomy and hypothyroid, stage III chronic kidney disease, COPD, former smoker, OSA on nightly CPAP, not on home oxygen, DM 2, GERD, HTN, RLS/Parkinson's (based on medications) presented to the ED with above complaints. He states that he was in his usual state of health until Christmas Eve when he developed high fever of 104F. His wife had some fever too. Since then he reports that his health has gone "downhill". He has urinary frequency which is new but no dysuria. Reports history of UTI approximately 5 years ago. No recurrence of fever, chills. Denies headache, sore throat, earache, runny nose, cough, chest pain, nausea, vomiting, abdominal pain, diarrhea, skin rash, insect bites or any other sick contacts other than his wife who had a fever but recovered well. Since then he noticed progressive weakness to the point where he started having to use a walker and noted that his handwriting was getting illegible. He was seen by his PCP 2 days ago, unable to say what was diagnosed, inhalers were changed and he was prescribed Omnicef (unable to say what infection) but chest x-ray reportedly normal. He did not notice any improvement. Decreased appetite, mouth dry. Due to progressive symptoms, he decided to come to the emergency department.  ED Course: Vital signs stable, WBC 11.7, BMP significant only for BUN mildly elevated at 29, urine microscopy positive for UTI features, chest x-ray shows low lung volumes without acute disease and  persistent scarring in the left lower lobe. He was given a dose of IV ceftriaxone for presumed UTI. He was also noted to be hypoxic in the upper 80s/low 90s on room air and was placed on oxygen.  Review of Systems:  All other systems reviewed and apart from HPI, are negative. He does report some intermittent twitching of his lower extremities. He denies strokelike symptoms including slurred speech, facial asymmetry, asymmetric tingling, numbness or asymmetric weakness. Denies prior history of stroke.  Past Medical History:  Diagnosis Date  . Barrett esophagus   . CAD (coronary artery disease)   . Cancer ([place])    thyroid  . Carotid artery occlusion   . Chronic kidney disease   . COPD (chronic obstructive pulmonary disease) ([place])   . Diabetes mellitus without complication ([place])   . GERD (gastroesophageal reflux disease)   . History of thyroid cancer 2008  . History of thyroid cancer   . Hypertension   . OSA (obstructive sleep apnea)   . Restless leg   . Sleep apnea with use of continuous positive airway pressure (CPAP)    2011 [facility]  77cn central and obstrcutive. 16 cm water , 3 cm EPR.   . Thyroid disease   . Ulcer    Peptic ulcer disease    Past Surgical History:  Procedure Laterality Date  . CAROTID ENDARTERECTOMY Left January 03, 2006   Dr. Amedeo Plenty  . JOINT REPLACEMENT Left    knee X 2  . THYROIDECTOMY  2008    Social History  reports that he quit smoking about 12 years  ago. His smoking use included cigars and cigarettes. He quit after 40.00 years of use. he has never used smokeless tobacco. He reports that he does not drink alcohol or use drugs.  No Known Allergies  Family History  Problem Relation Age of Onset  . COPD Mother   . Hyperlipidemia Mother   . Hypertension Mother   . Diabetes Father   . Kidney disease Father        ESRD  . Hypertension Father   . Cancer Father   . Hyperlipidemia Father      Prior to Admission medications   Medication  Sig Start Date End Date Taking? Authorizing Provider  allopurinol (ZYLOPRIM) 300 MG tablet Take 300 mg by mouth daily.  06/02/15  Yes [provider]  ANORO ELLIPTA 62.5-25 MCG/INH AEPB Inhale 1 puff into the lungs daily. 02/06/17  Yes Juanito Doom, MD  aspirin 325 MG tablet Take 325 mg by mouth daily.   Yes [provider]  calcium citrate-vitamin D (CITRACAL+D) 315-200 MG-UNIT per tablet Take 1 tablet by mouth daily. 750 mg    Yes [provider]  carbidopa-levodopa (SINEMET IR) 10-100 MG tablet Take 1 tablet by mouth 2 (two) times daily. 10/30/16  Yes Dohmeier, Asencion Partridge, MD  cefdinir (OMNICEF) 300 MG capsule Take 300 mg by mouth 2 (two) times daily. 06/18/17 06/25/17 Yes [provider]  cholecalciferol (VITAMIN D) 1000 units tablet Take 1,000 Units by mouth daily.    Yes [provider]  cloNIDine (CATAPRES) 0.1 MG tablet Take 0.1 mg by mouth 2 (two) times daily. 10/09/16  Yes [provider]  COLCRYS 0.6 MG tablet Take 0.6 mg by mouth daily. 02/13/13  Yes [provider]  fish oil-omega-3 fatty acids 1000 MG capsule Take 3 g by mouth daily.    Yes [provider]  Fluticasone Furoate (ARNUITY ELLIPTA) 200 MCG/ACT AEPB Inhale 200 mcg into the lungs daily.   Yes [provider]  Fluticasone-Umeclidin-Vilant (TRELEGY ELLIPTA) 100-62.5-25 MCG/INH AEPB Inhale 1 puff into the lungs daily.   Yes [provider]  guaifenesin (HUMIBID E) 400 MG TABS tablet Take 400 mg by mouth 2 (two) times daily as needed (congestion).   Yes [provider]  HYDROcodone-acetaminophen (NORCO/VICODIN) 5-325 MG per tablet Take 1 tablet by mouth 3 (three) times daily.  11/14/12  Yes [provider]  levothyroxine (SYNTHROID, LEVOTHROID) 200 MCG tablet Take 200 mcg by mouth daily before breakfast.    Yes [provider]  losartan-hydrochlorothiazide (HYZAAR) 100-25 MG per tablet Take 1 tablet by mouth daily.  11/04/12  Yes [provider]  metformin (FORTAMET) 500 MG (OSM) 24 hr tablet Take 500 mg by mouth 2 (two) times daily with a meal.    Yes [provider]  metoprolol tartrate (LOPRESSOR) 50 MG tablet Take 50 mg by mouth 2 (two) times daily.   Yes [provider]  Multiple Vitamin (MULTIVITAMIN) tablet Take 1 tablet by mouth daily.   Yes [provider]  oxymetazoline (AFRIN) 0.05 % nasal spray Place 2 sprays into the nose 2 (two) times daily as needed for congestion.    Yes [provider]  pantoprazole (PROTONIX) 40 MG tablet Take 40 mg by mouth every evening.   Yes [provider]  potassium chloride SA (K-DUR,KLOR-CON) 20 MEQ tablet Take 20 mEq by mouth daily. For seven days 06/18/17  Yes [provider]  rOPINIRole (REQUIP) 1 MG tablet TAKE 1 TABLET BY MOUTH IN THE MORNING AND 4  TABLETS AT BEDTIME Patient taking differently: TAKE 1mg  BY MOUTH IN THE MORNING AND 4mg  AT BEDTIME 06/08/17  Yes Dohmeier, Asencion Partridge, MD  simvastatin (ZOCOR) 20 MG tablet Take 20 mg by mouth every evening.   Yes [provider]  TRAVATAN Z 0.004 % SOLN ophthalmic solution Place 1 drop into both eyes.  07/25/13  Yes [provider]  VENTOLIN HFA 108 (90 Base) MCG/ACT inhaler Inhale 1-2 puffs into the lungs every 4 (four) hours as needed for wheezing or shortness of breath.  08/19/15  Yes [provider]  ARNUITY ELLIPTA 100 MCG/ACT AEPB Inhale 1 puff into the lungs daily. Patient not taking: Reported on 06/20/2017 02/06/17   Juanito Doom, MD  nitroGLYCERIN (NITROSTAT) 0.4 MG SL tablet Place 0.4 mg every 5 (five) minutes as needed under the tongue for chest pain.    [provider]  rizatriptan (MAXALT) 10 MG tablet Take 10 mg by mouth as needed for migraine. May repeat in 2 hours if needed    [provider]    Physical Exam: Vitals:   06/20/17 1457 06/20/17 1555 06/20/17 1556 06/20/17 1559  BP:  (!) 158/90  (!)  158/90  Pulse:   (!) 37 65  Resp:  12 17 (!) 22  Temp: 98.3 F (36.8 C)     TempSrc: Oral     SpO2:   95% 97%  Weight:      Height:          Constitutional: Elderly male, moderately built and nourished, lying comfortably supine in bed. Eyes: PERTLA, lids and conjunctivae normal ENMT: Mucous membranes are dry. Posterior pharynx clear of any exudate or lesions. Normal dentition.  Neck: supple, no masses, no thyromegaly Respiratory: clear to auscultation bilaterally, no wheezing, no crackles. Normal respiratory effort. No accessory muscle use.  Cardiovascular: S1 & S2 heard, regular rate and rhythm, no murmurs / rubs / gallops. No extremity edema. 2+ pedal pulses. No carotid bruits.  Abdomen: No distension, no tenderness, no masses palpated. No hepatosplenomegaly. Bowel sounds normal.  Musculoskeletal: no clubbing / cyanosis. No joint deformity upper and lower extremities. Good ROM, no contractures. Normal muscle tone.  Skin: no rashes, lesions, ulcers. No induration Neurologic: CN 2-12 grossly intact. Sensation intact, DTR normal. Strength 5/5 in all 4 limbs.  Psychiatric: Normal judgment and insight. Alert and oriented x 3. Normal mood.     Labs on Admission: I have personally reviewed following labs and imaging studies  CBC: Recent Labs  Lab 06/20/17 1128  WBC 11.7*  HGB 13.8  HCT 42.5  MCV 85.9  PLT 203   Basic Metabolic Panel: Recent Labs  Lab 06/20/17 1128  NA 138  K 3.7  CL 101  CO2 25  GLUCOSE 114*  BUN 29*  CREATININE 0.90  CALCIUM 9.7   Urine analysis:    Component Value Date/Time   COLORURINE YELLOW 06/20/2017 1129   APPEARANCEUR TURBID (A) 06/20/2017 1129   LABSPEC 1.020 06/20/2017 1129   PHURINE 5.5 06/20/2017 1129   GLUCOSEU NEGATIVE 06/20/2017 1129   HGBUR TRACE (A) 06/20/2017 1129   BILIRUBINUR NEGATIVE 06/20/2017 1129   KETONESUR NEGATIVE 06/20/2017 1129   PROTEINUR NEGATIVE 06/20/2017 1129   UROBILINOGEN 1.0 09/09/2010 1045   NITRITE  POSITIVE (A) 06/20/2017 1129   LEUKOCYTESUR LARGE (A) 06/20/2017 1129     Radiological Exams on Admission: Dg Chest 2 View  Result Date: 06/20/2017 CLINICAL DATA:  77 year old male with measures a congestion, weakness and fever, with worsening weakness. EXAM: CHEST  2 VIEW COMPARISON:  Chest x-ray 08/03/2010. FINDINGS: Low lung volumes. Linear scarring in the left lower lobe, similar to prior study from 2012. No acute consolidative airspace disease. No pleural effusions. No evidence of pulmonary edema. No definite suspicious appearing pulmonary nodules or masses. Heart size is normal. The patient is rotated to the right on today's exam, resulting in distortion of the mediastinal contours and reduced diagnostic sensitivity and specificity for mediastinal pathology. IMPRESSION: 1. Low lung volumes without radiographic evidence of acute cardiopulmonary disease. 2. Persistent scarring the left lower lobe, similar to prior study from 2012. Electronically Signed   By: Vinnie Langton M.D.   On: 06/20/2017 15:38    EKG: Independently reviewed. Sinus bradycardia at 56 bpm, RBBB and LAFB, LVH and no acute changes. No prior EKG to compare in system.  Assessment/Plan Principal Problem:   Acute cystitis Active Problems:   RLS (restless legs syndrome)   OSA on CPAP   Generalized weakness   Dehydration     1. Acute cystitis: Symptomatic for urinary frequency and an episode of high fever 104F on Christmas Eve. Clinically unable to find any other source at this time. Continue IV ceftriaxone pending urine culture results. Reports prior history of UTI. 2. Dehydration: Secondary to problem #1 and poor oral intake. Brief IV fluids overnight. Encouraged oral intake. 3. Generalized weakness: Secondary to acute cystitis, dehydration complicating underlying Parkinson's disease/RLS and age. Treat as above. PT and OT evaluation. 4. Hypoxia: Multifactorial secondary to COPD, OSA, atelectasis. No acute etiology  noted. Incentive spirometry, nightly CPAP. Reassess need for home oxygen prior to discharge. 5. COPD: Stable without clinical bronchospasm. Continue home inhalers. 6. OSA: Continue nightly CPAP. 7. DM 2: Hold metformin. SSI. 8. Essential hypertension: Likely refractory given polypharmacy. Continue Catapres, Hyzaar, metoprolol. 9. History of gout: No acute flare. Continue allopurinol and colchicine. 10. Hypothyroid: Continue home dose of Synthroid. 11. CAD: Asymptomatic of chest pain. Continue aspirin, beta blockers and statins. 12. Parkinson's disease: Continue Sinemet. 13. RLS: Continue Requip.   DVT prophylaxis: Lovenox  Code Status: DO NOT RESUSCITATE. This was confirmed with patient. No family at bedside.  Family Communication: None at bedside.  Disposition Plan: DC home when medically improved.  Consults called: None  Admission status: Observation, medical bed.    Vernell Leep MD [facility] Pager [phone]  If 7PM-7AM, please contact night-coverage [email or web address] Password [facility]  06/20/2017, 4:51 PM

## 2017-06-20 NOTE — ED Provider Notes (Signed)
77 y.o. Male with increasing weakness, work up c.w. Uti, and unable to walk due to weakness.  Plan iv antibiotics and fluids.  I performed a history and physical examination of [name] and discussed his management with Alecia Lemming .  I agree with the history, physical, assessment, and plan of care, with the following exceptions: None  I was present for the following procedures: None Time Spent in Critical Care of the patient: None Time spent in discussions with the patient and family: 10  Normajean Baxter, MD 06/20/17 1452

## 2017-06-20 NOTE — ED Provider Notes (Signed)
[facility] EMERGENCY DEPARTMENT Provider Note   CSN: [id number] Arrival date & time: 06/20/17  1111     History   Chief Complaint Chief Complaint  Patient presents with  . Weakness    HPI [name] is a 77 y.o. male.  Patient with history of coronary artery disease, chronic kidney disease, COPD, obstructive sleep apnea compliant with CPAP --presents to the emergency department today with worsening generalized weakness.  Patient was at his baseline walking with a cane and driving a car 10 days ago, but since that time has become progressively weak.  Wife notes that his handwriting has become much less clear.  Patient is unable to walk and now needs to be pushed in a wheelchair.  He states that when he stands knees just give out.  Patient denies signs of stroke including: facial droop, slurred speech, aphasia, unilateral weakness/numbness in extremities, imbalance.  Patient also has some shortness of breath.  He saw his PCP 2 days ago for generalized weakness and had an x-ray which was clear reportedly.  No fevers, URI symptoms, or headache.  Occasional mild nonproductive cough.  Patient denies any chest pain or abdominal pain.  No nausea, vomiting, or diarrhea.  Patient has increased urinary frequency but no dysuria.  No skin rashes.  No treatments prior to arrival.      Past Medical History:  Diagnosis Date  . Barrett esophagus   . CAD (coronary artery disease)   . Cancer ([place])    thyroid  . Carotid artery occlusion   . Chronic kidney disease   . COPD (chronic obstructive pulmonary disease) ([place])   . Diabetes mellitus without complication ([place])   . GERD (gastroesophageal reflux disease)   . History of thyroid cancer 2008  . History of thyroid cancer   . Hypertension   . OSA (obstructive sleep apnea)   . Restless leg   . Sleep apnea with use of continuous positive airway pressure (CPAP)    2011 [facility] , [facility]  77cn central and obstrcutive. 16 cm  water , 3 cm EPR.   . Thyroid disease   . Ulcer    Peptic ulcer disease    Patient Active Problem List   Diagnosis Date Noted  . Dyspnea 12/26/2016  . Chronic cough 12/26/2016  . Hypersomnia with sleep apnea 10/30/2016  . CSA (central sleep apnea) 10/30/2016  . Wheezing symptom 10/30/2016  . CAP (community acquired pneumonia) 07/01/2015  . CPAP/BiPAP dependent 07/01/2015  . 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 RLS (restless legs syndrome) 08/25/2014  . Primary gout 08/25/2014  . OSA on CPAP 08/25/2014  . Severe obesity (BMI >= 40) ([place]) 08/25/2014  . Obesity (BMI 30-39.9) 02/18/2013  . Sleep apnea with use of continuous positive airway pressure (CPAP)   . 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 S/P TKR (total knee replacement) 11/29/2012  . History of thyroid cancer     Past Surgical History:  Procedure Laterality Date  . CAROTID ENDARTERECTOMY Left January 03, 2006   Dr. Amedeo Plenty  . JOINT REPLACEMENT Left    knee X 2  . THYROIDECTOMY  2008       Home Medications    Prior to Admission medications   Medication Sig Start Date End Date Taking? Authorizing Provider  allopurinol (ZYLOPRIM) 300 MG tablet Take 300 mg by mouth daily.  06/02/15   [provider]  Celedonio Miyamoto 62.5-25 MCG/INH AEPB Inhale 1  puff into the lungs daily. 02/06/17   Juanito Doom, MD  ARNUITY ELLIPTA 100 MCG/ACT AEPB Inhale 1 puff into the lungs daily. 02/06/17   Juanito Doom, MD  aspirin 325 MG tablet Take 325 mg by mouth daily.    [provider]  betamethasone dipropionate (DIPROLENE) 0.05 % cream Apply topically 2 (two) times daily.    [provider]  calcium citrate-vitamin D (CITRACAL+D) 315-200 MG-UNIT per tablet Take 1 tablet by mouth 2 (two) times daily. 750 mg    [provider]  carbidopa-levodopa (SINEMET IR) 10-100 MG tablet Take 1 tablet by mouth 2 (two) times daily. 10/30/16   Dohmeier,  Asencion Partridge, MD  Cholecalciferol (VITAMIN D3) 2000 UNITS TABS Take 1 tablet by mouth daily.    [provider]  cloNIDine (CATAPRES) 0.1 MG tablet Take 0.1 mg by mouth 2 (two) times daily. 10/09/16   [provider]  COLCRYS 0.6 MG tablet Take 0.6 mg by mouth daily. 02/13/13   [provider]  fish oil-omega-3 fatty acids 1000 MG capsule Take 3 g by mouth daily.     [provider]  gabapentin (NEURONTIN) 300 MG capsule Take 300 mg by mouth 2 (two) times daily. 08/11/16   [provider]  HYDROcodone-acetaminophen (NORCO/VICODIN) 5-325 MG per tablet Take 1-2 tablets by mouth every 4 (four) hours as needed.  11/14/12   [provider]  levothyroxine (SYNTHROID, LEVOTHROID) 200 MCG tablet Take 200 mcg by mouth daily before breakfast.     [provider]  losartan-hydrochlorothiazide (HYZAAR) 100-25 MG per tablet Take 1 tablet by mouth daily. 11/04/12   [provider]  metformin (FORTAMET) 500 MG (OSM) 24 hr tablet Take 500 mg by mouth 2 (two) times daily with a meal.     [provider]  Multiple Vitamin (MULTIVITAMIN) tablet Take 1 tablet by mouth daily.    [provider]  nitroGLYCERIN (NITROSTAT) 0.4 MG SL tablet Place 0.4 mg every 5 (five) minutes as needed under the tongue for chest pain.    [provider]  oxymetazoline (AFRIN) 0.05 % nasal spray Place 2 sprays into the nose 2 (two) times daily.    [provider]  Respiratory Therapy Supplies (FLUTTER) DEVI Use as directed 11/28/16   Juanito Doom, MD  rizatriptan (MAXALT) 10 MG tablet Take 10 mg by mouth as needed for migraine. May repeat in 2 hours if needed    [provider]  rOPINIRole (REQUIP) 1 MG tablet TAKE 1 TABLET BY MOUTH IN THE MORNING AND 4 TABLETS AT BEDTIME 06/08/17   Dohmeier, Asencion Partridge, MD  simvastatin (ZOCOR) 20 MG tablet Take 20 mg by mouth every evening.    [provider]  TRAVATAN Z 0.004 % SOLN  ophthalmic solution Place 1 drop into both eyes.  07/25/13   [provider]  VENTOLIN HFA 108 (90 Base) MCG/ACT inhaler Inhale 1-2 puffs into the lungs every 4 (four) hours as needed.  08/19/15   [provider]    Family History Family History  Problem Relation Age of Onset  . COPD Mother   . Hyperlipidemia Mother   . Hypertension Mother   . Diabetes Father   . Kidney disease Father        ESRD  . Hypertension Father   . Cancer Father   . Hyperlipidemia Father     Social History Social History   Tobacco Use  . Smoking status: Former Smoker    Years: 40.00  Types: Cigars, Cigarettes    Last attempt to quit: 06/19/2005    Years since quitting: 12.0  . Smokeless tobacco: Never Used  . Tobacco comment: smoked 4-6 cigars daily, only smoked cigarettes socially  Substance Use Topics  . Alcohol use: No  . Drug use: No     Allergies   Patient has no known allergies.   Review of Systems Review of Systems  Constitutional: Negative for fever.  HENT: Negative for rhinorrhea and sore throat.   Eyes: Negative for redness.  Respiratory: Negative for cough.   Cardiovascular: Negative for chest pain.  Gastrointestinal: Negative for abdominal pain, diarrhea, nausea and vomiting.  Genitourinary: Positive for frequency. Negative for dysuria.  Musculoskeletal: Positive for gait problem. Negative for myalgias.  Skin: Negative for rash.  Neurological: Positive for weakness. Negative for headaches.     Physical Exam Updated Vital Signs BP (!) 167/82   Pulse 70   Temp 98 F (36.7 C) (Oral)   Resp 17   Ht 5\' 7"  (1.702 m)   Wt 101.2 kg (223 lb)   SpO2 97%   BMI 34.93 kg/m   Physical Exam  Constitutional: He is oriented to person, place, and time. He appears well-developed and well-nourished.  HENT:  Head: Normocephalic and atraumatic.  Right Ear: Tympanic membrane, external ear and ear canal normal.  Left Ear: Tympanic membrane, external ear and ear canal  normal.  Nose: Nose normal.  Mouth/Throat: Uvula is midline, oropharynx is clear and moist and mucous membranes are normal.  Eyes: Conjunctivae, EOM and lids are normal. Pupils are equal, round, and reactive to light. Right eye exhibits no discharge. Left eye exhibits no discharge.  Neck: Normal range of motion. Neck supple.  Cardiovascular: Normal rate, regular rhythm and normal heart sounds.  Pulmonary/Chest: Effort normal and breath sounds normal.  Abdominal: Soft. There is no tenderness.  Musculoskeletal: Normal range of motion.       Cervical back: He exhibits normal range of motion, no tenderness and no bony tenderness.  Neurological: He is alert and oriented to person, place, and time. He has normal strength. No cranial nerve deficit or sensory deficit. He exhibits normal muscle tone. Coordination normal. GCS eye subscore is 4. GCS verbal subscore is 5. GCS motor subscore is 6.  Skin: Skin is warm and dry.  Psychiatric: He has a normal mood and affect.  Nursing note and vitals reviewed.    ED Treatments / Results  Labs (all labs ordered are listed, but only abnormal results are displayed) Labs Reviewed  BASIC METABOLIC PANEL - Abnormal; Notable for the following components:      Result Value   Glucose, Bld 114 (*)    BUN 29 (*)    All other components within normal limits  CBC - Abnormal; Notable for the following components:   WBC 11.7 (*)    All other components within normal limits  URINALYSIS, ROUTINE W REFLEX MICROSCOPIC - Abnormal; Notable for the following components:   APPearance TURBID (*)    Hgb urine dipstick TRACE (*)    Nitrite POSITIVE (*)    Leukocytes, UA LARGE (*)    All other components within normal limits  URINALYSIS, MICROSCOPIC (REFLEX) - Abnormal; Notable for the following components:   Bacteria, UA MANY (*)    Squamous Epithelial / LPF 0-5 (*)    All other components within normal limits  URINE CULTURE  I-STAT TROPONIN, ED    ED ECG REPORT    Date: 06/20/2017  Rate: 56  Rhythm: sinus bradycardia  QRS Axis: left  Intervals: normal  ST/T Wave abnormalities: nonspecific ST changes  Conduction Disutrbances:right bundle branch block and left anterior fascicular block  Narrative Interpretation:   Old EKG Reviewed: unchanged  I have personally reviewed the EKG tracing and agree with the computerized printout as noted.    Radiology Dg Chest 2 View  Result Date: 06/20/2017 CLINICAL DATA:  77 year old male with measures a congestion, weakness and fever, with worsening weakness. EXAM: CHEST  2 VIEW COMPARISON:  Chest x-ray 08/03/2010. FINDINGS: Low lung volumes. Linear scarring in the left lower lobe, similar to prior study from 2012. No acute consolidative airspace disease. No pleural effusions. No evidence of pulmonary edema. No definite suspicious appearing pulmonary nodules or masses. Heart size is normal. The patient is rotated to the right on today's exam, resulting in distortion of the mediastinal contours and reduced diagnostic sensitivity and specificity for mediastinal pathology. IMPRESSION: 1. Low lung volumes without radiographic evidence of acute cardiopulmonary disease. 2. Persistent scarring the left lower lobe, similar to prior study from 2012. Electronically Signed   By: Vinnie Langton M.D.   On: 06/20/2017 15:38    Procedures Procedures (including critical care time)  Medications Ordered in ED Medications  cefTRIAXone (ROCEPHIN) 1 g in dextrose 5 % 50 mL IVPB (1 g Intravenous New Bag/Given 06/20/17 1510)     Initial Impression / Assessment and Plan / ED Course  I have reviewed the triage vital signs and the nursing notes.  Pertinent labs & imaging results that were available during my care of the patient were reviewed by me and considered in my medical decision making (see chart for details).     Patient seen and examined.  Overall neuro exam is not concerning for stroke.  However, given decline over the past 1-2  weeks, patient will need to be admitted.  He was placed on 2 L nasal cannula in the room.  Off of oxygen, he has a desaturation into the upper 80s and low 90s.  No shortness of breath.  This is likely related to his COPD.  Will check chest x-ray, troponin and EKG.  Vital signs reviewed and are as follows: BP (!) 167/82   Pulse 70   Temp 98 F (36.7 C) (Oral)   Resp 17   Ht 5\' 7"  (1.702 m)   Wt 101.2 kg (223 lb)   SpO2 97%   BMI 34.93 kg/m   CXR, troponin, and EKG are reassuring.   Spoke with Dr. Algis Liming who will see.   Final Clinical Impressions(s) / ED Diagnoses   Final diagnoses:  Acute cystitis without hematuria  Generalized weakness   Admit for weakness in setting of UTI. No focal neuro deficits.   ED Discharge Orders    None       Carlisle Cater, Hershal Coria 06/20/17 1608    Pattricia Boss, MD 06/21/17 [phone]

## 2017-06-21 DIAGNOSIS — G20 Parkinson's disease: Secondary | ICD-10-CM

## 2017-06-21 DIAGNOSIS — E860 Dehydration: Secondary | ICD-10-CM | POA: Diagnosis not present

## 2017-06-21 DIAGNOSIS — R531 Weakness: Secondary | ICD-10-CM | POA: Diagnosis not present

## 2017-06-21 DIAGNOSIS — N3000 Acute cystitis without hematuria: Secondary | ICD-10-CM | POA: Diagnosis not present

## 2017-06-21 DIAGNOSIS — L8990 Pressure ulcer of unspecified site, unspecified stage: Secondary | ICD-10-CM

## 2017-06-21 LAB — GLUCOSE, CAPILLARY
Glucose-Capillary: 97 mg/dL (ref 65–99)
Glucose-Capillary: 96 mg/dL (ref 65–99)
Glucose-Capillary: 87 mg/dL (ref 65–99)
Glucose-Capillary: 168 mg/dL — ABNORMAL HIGH (ref 65–99)

## 2017-06-21 MED ORDER — OXYMETAZOLINE HCL 0.05 % NA SOLN
2.0000 | Freq: Two times a day (BID) | NASAL | Status: DC | PRN
  Filled 2017-06-21: qty 15

## 2017-06-21 NOTE — Progress Notes (Signed)
PROGRESS NOTE    [name]  GUR:[id number] DOB: Oct 29, 1940 DOA: 06/20/2017 PCP: Cesar Battles, MD   Outpatient Specialists:     Brief Narrative:  [name] is a married male, mostly independent and at times used a cane for balance, driving until 2-83 days ago, PMH of CAD, thyroid cancer status post thyroidectomy and hypothyroid, stage III chronic kidney disease, COPD, former smoker, OSA on nightly CPAP, not on home oxygen, DM 2, GERD, HTN, RLS/Parkinson's (based on medications) presented to the ED with above complaints. He states that he was in his usual state of health until Christmas Eve when he developed high fever of 104F. His wife had some fever too. Since then he reports that his health has gone "downhill". He has urinary frequency which is new but no dysuria. Reports history of UTI approximately 5 years ago. No recurrence of fever, chills. Denies headache, sore throat, earache, runny nose, cough, chest pain, nausea, vomiting, abdominal pain, diarrhea, skin rash, insect bites or any other sick contacts other than his wife who had a fever but recovered well. Since then he noticed progressive weakness to the point where he started having to use a walker and noted that his handwriting was getting illegible. He was seen by his PCP 2 days ago, unable to say what was diagnosed, inhalers were changed and he was prescribed Omnicef (unable to say what infection) but chest x-ray reportedly normal. He did not notice any improvement. Decreased appetite, mouth dry. Due to progressive symptoms, he decided to come to the emergency department.     Assessment & Plan:   Principal Problem:   Acute cystitis Active Problems:   RLS (restless legs syndrome)   OSA on CPAP   Generalized weakness   Dehydration   Pressure injury of skin   UTI:  -IV ceftriaxone  -urine culture results  Dehydration:  -secondary to UTI and poor oral intake. Brief IV fluids overnight. Encouraged oral  intake.  Generalized weakness: Secondary to acute cystitis, dehydration complicating underlying Parkinson's disease/RLS and age.  - PT and OT evaluation.  Hypoxia:  -assess need for home oxygen prior to discharge.  COPD: Stable without clinical bronchospasm. Continue home inhalers.  OSA: Continue nightly CPAP.  DM 2: Hold metformin. SSI.  Essential hypertension: Continue Catapres, Hyzaar, metoprolol.  History of gout: No acute flare. Continue allopurinol and colchicine.  Hypothyroid: Continue home dose of Synthroid.  CAD: Asymptomatic of chest pain. Continue aspirin, beta blockers and statins.  Parkinson's disease: Continue Sinemet.  RLS: Continue Requip.      DVT prophylaxis:  Lovenox   Code Status: DNR   Family Communication:   Disposition Plan:     Consultants:      Subjective: Slipping in the bed  Objective: Vitals:   06/20/17 2100 06/20/17 2147 06/21/17 0539 06/21/17 0935  BP:  (!) 141/73 132/78   Pulse:  76 63   Resp:   17   Temp:  98.3 F (36.8 C) 97.6 F (36.4 C)   TempSrc:  Oral Oral   SpO2: 97% 95% 97% 92%  Weight:      Height:        Intake/Output Summary (Last 24 hours) at 06/21/2017 1233 Last data filed at 06/21/2017 0540 Gross per 24 hour  Intake 410 ml  Output 850 ml  Net -440 ml   Filed Weights   06/20/17 1125  Weight: 101.2 kg (223 lb)    Examination:  General exam: Appears calm and comfortable  Respiratory system: Clear  to auscultation. Respiratory effort normal. Cardiovascular system: S1 & S2 heard, RRR. No JVD, murmurs, rubs, gallops or clicks. No pedal edema. Gastrointestinal system: Abdomen is nondistended, soft and nontender. No organomegaly or masses felt. Normal bowel sounds heard. Central nervous system: Alert and oriented. No focal neurological deficits. Extremities: Symmetric 5 x 5 power. Skin: No rashes, lesions or ulcers Psychiatry: Judgement and insight appear normal. Mood & affect appropriate.      Data Reviewed: I have personally reviewed following labs and imaging studies  CBC: Recent Labs  Lab 06/20/17 1128  WBC 11.7*  HGB 13.8  HCT 42.5  MCV 85.9  PLT 102   Basic Metabolic Panel: Recent Labs  Lab 06/20/17 1128  NA 138  K 3.7  CL 101  CO2 25  GLUCOSE 114*  BUN 29*  CREATININE 0.90  CALCIUM 9.7   GFR: Estimated Creatinine Clearance: 79.1 mL/min (by C-G formula based on SCr of 0.9 mg/dL). Liver Function Tests: No results for input(s): AST, ALT, ALKPHOS, BILITOT, PROT, ALBUMIN in the last 168 hours. No results for input(s): LIPASE, AMYLASE in the last 168 hours. No results for input(s): AMMONIA in the last 168 hours. Coagulation Profile: No results for input(s): INR, PROTIME in the last 168 hours. Cardiac Enzymes: No results for input(s): CKTOTAL, CKMB, CKMBINDEX, TROPONINI in the last 168 hours. BNP (last 3 results) Recent Labs    12/26/16 1208  PROBNP 86.0   HbA1C: No results for input(s): HGBA1C in the last 72 hours. CBG: Recent Labs  Lab 06/20/17 1823 06/20/17 2150 06/21/17 0816 06/21/17 1216  GLUCAP 89 135* 97 96   Lipid Profile: No results for input(s): CHOL, HDL, LDLCALC, TRIG, CHOLHDL, LDLDIRECT in the last 72 hours. Thyroid Function Tests: No results for input(s): TSH, T4TOTAL, FREET4, T3FREE, THYROIDAB in the last 72 hours. Anemia Panel: No results for input(s): VITAMINB12, FOLATE, FERRITIN, TIBC, IRON, RETICCTPCT in the last 72 hours. Urine analysis:    Component Value Date/Time   COLORURINE YELLOW 06/20/2017 1129   APPEARANCEUR TURBID (A) 06/20/2017 1129   LABSPEC 1.020 06/20/2017 1129   PHURINE 5.5 06/20/2017 1129   GLUCOSEU NEGATIVE 06/20/2017 1129   HGBUR TRACE (A) 06/20/2017 1129   BILIRUBINUR NEGATIVE 06/20/2017 1129   KETONESUR NEGATIVE 06/20/2017 1129   PROTEINUR NEGATIVE 06/20/2017 1129   UROBILINOGEN 1.0 09/09/2010 1045   NITRITE POSITIVE (A) 06/20/2017 1129   LEUKOCYTESUR LARGE (A) 06/20/2017 1129     )No  results found for this or any previous visit (from the past 240 hour(s)).    Anti-infectives (From admission, onward)   Start     Dose/Rate Route Frequency Ordered Stop   06/21/17 1500  cefTRIAXone (ROCEPHIN) 1 g in dextrose 5 % 50 mL IVPB     1 g 100 mL/hr over 30 Minutes Intravenous Every 24 hours 06/20/17 1743     06/20/17 1500  cefTRIAXone (ROCEPHIN) 1 g in dextrose 5 % 50 mL IVPB     1 g 100 mL/hr over 30 Minutes Intravenous  Once 06/20/17 1453 06/20/17 1702       Radiology Studies: Dg Chest 2 View  Result Date: 06/20/2017 CLINICAL DATA:  77 year old male with measures a congestion, weakness and fever, with worsening weakness. EXAM: CHEST  2 VIEW COMPARISON:  Chest x-ray 08/03/2010. FINDINGS: Low lung volumes. Linear scarring in the left lower lobe, similar to prior study from 2012. No acute consolidative airspace disease. No pleural effusions. No evidence of pulmonary edema. No definite suspicious appearing pulmonary nodules or masses. Heart size  is normal. The patient is rotated to the right on today's exam, resulting in distortion of the mediastinal contours and reduced diagnostic sensitivity and specificity for mediastinal pathology. IMPRESSION: 1. Low lung volumes without radiographic evidence of acute cardiopulmonary disease. 2. Persistent scarring the left lower lobe, similar to prior study from 2012. Electronically Signed   By: Vinnie Langton M.D.   On: 06/20/2017 15:38        Scheduled Meds: . allopurinol  300 mg Oral Daily  . aspirin  325 mg Oral Daily  . budesonide (PULMICORT) nebulizer solution  0.5 mg Nebulization BID  . calcium-vitamin D  1 tablet Oral Q breakfast  . carbidopa-levodopa  1 tablet Oral BID  . cholecalciferol  1,000 Units Oral Daily  . cloNIDine  0.1 mg Oral BID  . colchicine  0.6 mg Oral Daily  . enoxaparin (LOVENOX) injection  40 mg Subcutaneous Q24H  . losartan  100 mg Oral Daily   And  . hydrochlorothiazide  25 mg Oral Daily  . insulin  aspart  0-9 Units Subcutaneous TID WC  . latanoprost  1 drop Both Eyes QHS  . levothyroxine  200 mcg Oral QAC breakfast  . metoprolol tartrate  50 mg Oral BID  . multivitamin with minerals  1 tablet Oral Daily  . omega-3 acid ethyl esters  3 g Oral Daily  . pantoprazole  40 mg Oral QPM  . potassium chloride SA  20 mEq Oral Daily  . rOPINIRole  1 mg Oral Daily  . rOPINIRole  4 mg Oral QHS  . simvastatin  20 mg Oral QPM  . umeclidinium-vilanterol  1 puff Inhalation Daily   Continuous Infusions: . cefTRIAXone (ROCEPHIN)  IV       LOS: 0 days    Time spent: 35 min    Geradine Girt, DO [facility] Pager [phone]  If 7PM-7AM, please contact night-coverage [email or web address] Password [facility] 06/21/2017, 12:33 PM

## 2017-06-21 NOTE — Evaluation (Signed)
Physical Therapy Evaluation Patient Details Name: [name] MRN: [id number] DOB: Aug 29, 1940 Today's Date: 06/21/2017   History of Present Illness  [name] is a married male, mostly independent and at times used a cane for balance, drives; pt presented to ED with fever, urinary frequency and weakness--->positive for UTI, also hypoxic; PMH of CAD, thyroid cancer status post thyroidectomy and hypothyroid, stage III chronic kidney disease, COPD, former smoker, OSA on nightly CPAP, not on home oxygen, DM 2, GERD, HTN, RLS/Parkinson's (based on medications)   Clinical Impression  Pt admitted with above diagnosis. Pt currently with functional limitations due to the deficits listed below (see PT Problem List).  Pt is grossly IND at baseline, amb with cane or rollator depending on how he feels;  Pt is significantly limited by fatigue today, dyspneic with transfer OOB to chair; he does not feel he can amb and states his legs feel like they will "give way", pt has had 2 recent falls;   Given limitations at this time,  recommend possible SNF, although pt may not be amenable to this--therefore would  recommend HHPT and 24 hr assist/supervision if pt declines SNF; Pt will benefit from skilled PT to increase their independence and safety with mobility to allow discharge to the venue listed below.  Will follow in acute setting    Follow Up Recommendations SNF(vs HHPT)    Equipment Recommendations  None recommended by PT    Recommendations for Other Services       Precautions / Restrictions Precautions Precautions: Fall Restrictions Weight Bearing Restrictions: No      Mobility  Bed Mobility Overal bed mobility: Needs Assistance Bed Mobility: Supine to Sit     Supine to sit: HOB elevated;Min assist     General bed mobility comments: HOB at 30*, incr time needed, effortful transition, pt does partial roll and is reliant on use of rail; min assist to elevate trunk, cues to bring LEs  completely off bed  Transfers Overall transfer level: Needs assistance Equipment used: Rolling walker (2 wheeled) Transfers: Sit to/from [facility] Sit to Stand: Min assist Stand pivot transfers: Min assist       General transfer comment: standing trials x3; pt fatigues easily and requires rest after ~ 15sec standing; mildly dyspneic although SpO2 within normal range on 2L O2; verbal cues for hand placement, trunk and hip extension and safe technique with RW  Ambulation/Gait             General Gait Details: unable d/t fatigue  Stairs            Wheelchair Mobility    Modified Rankin (Stroke Patients Only)       Balance Overall balance assessment: Needs assistance;History of Falls(pt reports 2 recent falls (within the last month or so) both times falling forward) Sitting-balance support: Feet supported;No upper extremity supported Sitting balance-Leahy Scale: Fair Sitting balance - Comments: cues for midline, LOB to right intermittently Postural control: Right lateral lean Standing balance support: During functional activity;Bilateral upper extremity supported Standing balance-Leahy Scale: Poor Standing balance comment: pt is reliant on UE as well as external support for static and dynamic balance                             Pertinent Vitals/Pain Pain Assessment: 0-10 Pain Score: 2  Pain Location: buttocks Pain Descriptors / Indicators: Discomfort Pain Intervention(s): Monitored during session    Home Living Family/patient expects to  be discharged to:: Private residence Living Arrangements: Spouse/significant other Available Help at Discharge: Family;Available 24 hours/day Type of Home: House Home Access: Stairs to enter   [employer] of Steps: 1 and "a half" Home Layout: One level Home Equipment: Cane - single point;Walker - 4 wheels;Walker - 2 wheels Additional Comments: inherited rollator    Prior Function  Level of Independence: Independent;Independent with assistive device(s)         Comments: amb with cane or rollator depending on the day     Hand Dominance        Extremity/Trunk Assessment   Upper Extremity Assessment Upper Extremity Assessment: Defer to OT evaluation;Generalized weakness    Lower Extremity Assessment Lower Extremity Assessment: Generalized weakness    Cervical / Trunk Assessment Cervical / Trunk Assessment: Kyphotic  Communication   Communication: No difficulties  Cognition Arousal/Alertness: Awake/alert Behavior During Therapy: WFL for tasks assessed/performed Overall Cognitive Status: Within Functional Limits for tasks assessed                                        General Comments      Exercises     Assessment/Plan    PT Assessment Patient needs continued PT services  PT Problem List Decreased strength;Decreased activity tolerance;Decreased balance;Decreased mobility       PT Treatment Interventions DME instruction;Gait training;Functional mobility training;Therapeutic exercise;Therapeutic activities;Patient/family education;Balance training    PT Goals (Current goals can be found in the Care Plan section)  Acute Rehab PT Goals Patient Stated Goal: home soon PT Goal Formulation: With patient Potential to Achieve Goals: Good    Frequency Min 3X/week   Barriers to discharge        Co-evaluation               AM-PAC PT "6 Clicks" Daily Activity  Outcome Measure Difficulty turning over in bed (including adjusting bedclothes, sheets and blankets)?: Unable Difficulty moving from lying on back to sitting on the side of the bed? : Unable Difficulty sitting down on and standing up from a chair with arms (e.g., wheelchair, bedside commode, etc,.)?: Unable Help needed moving to and from a bed to chair (including a wheelchair)?: A Little Help needed walking in hospital room?: A Lot Help needed climbing 3-5 steps  with a railing? : A Lot 6 Click Score: 10    End of Session Equipment Utilized During Treatment: Gait belt Activity Tolerance: Patient limited by fatigue Patient left: in chair;with call bell/phone within reach(bed alarm not on prior to OOB)   PT Visit Diagnosis: Difficulty in walking, not elsewhere classified (R26.2);Unsteadiness on feet (R26.81);Muscle weakness (generalized) (M62.81)    Time: 7939-0300 PT Time Calculation (min) (ACUTE ONLY): 21 min   Charges:   PT Evaluation $PT Eval Low Complexity: 1 Low     PT G CodesKenyon Ana, PT Pager: [phone] 06/21/2017   [facility] 06/21/2017, 11:27 AM

## 2017-06-21 NOTE — Progress Notes (Signed)
Patient was on bedside commode and had Maybee off when RT entered. RT added O2 tubing to allow patient to wear O2 when in different areas of the room and placed patient back on [place] when treatment was done.

## 2017-06-21 NOTE — Evaluation (Signed)
Occupational Therapy Evaluation Patient Details Name: [name] MRN: [id number] DOB: [birth year] Today's Date: 06/21/2017    History of Present Illness [name] is a married male, mostly independent and at times used a cane for balance, drives; pt presented to ED with fever, urinary frequency and weakness--->positive for UTI, also hypoxic; PMH of CAD, thyroid cancer status post thyroidectomy and hypothyroid, stage III chronic kidney disease, COPD, former smoker, OSA on nightly CPAP, not on home oxygen, DM 2, GERD, HTN, RLS/Parkinson's (based on medications)    Clinical Impression   Per wife, pt is independent at baseline in ADL, sometimes walks with a cane or rollator and was driving until he became ill 2 weeks ago. Pt presents with weakness, requiring 2 person min assist to stand from chair and transfer to [facility]. He needs min to total assist to for ADL and was not able to ambulate this visit. Recommending further rehab in SNF. Will follow acutely.    Follow Up Recommendations  SNF;Supervision/Assistance - 24 hour(HHOT if pt declines SNF)    Equipment Recommendations  3 in 1 bedside commode    Recommendations for Other Services       Precautions / Restrictions Precautions Precautions: Fall Restrictions Weight Bearing Restrictions: No      Mobility Bed Mobility Overal bed mobility: Needs Assistance Bed Mobility: Supine to Sit     Supine to sit: HOB elevated;Min assist     General bed mobility comments: pt received in chair  Transfers Overall transfer level: Needs assistance Equipment used: Rolling walker (2 wheeled) Transfers: Sit to/from [facility] Sit to Stand: Min assist;+2 physical assistance Stand pivot transfers: Min assist;+2 safety/equipment       General transfer comment: assist to stand and gain balance, cues for posture, but pt unable to stand upright    Balance Overall balance assessment: Needs assistance;History of  Falls Sitting-balance support: Feet supported;No upper extremity supported Sitting balance-Leahy Scale: Fair Sitting balance - Comments: in chair and bed Postural control: Right lateral lean Standing balance support: During functional activity;Bilateral upper extremity supported Standing balance-Leahy Scale: Poor Standing balance comment: pt is reliant on B UE as well as physical assist for static and dynamic balance                           ADL either performed or assessed with clinical judgement   ADL Overall ADL's : Needs assistance/impaired Eating/Feeding: Set up;Sitting   Grooming: Wash/dry hands;Wash/dry face;Sitting;Set up   Upper Body Bathing: Minimal assistance;Sitting   Lower Body Bathing: Sit to/from stand;Total assistance   Upper Body Dressing : Minimal assistance;Sitting   Lower Body Dressing: Total assistance;Sit to/from stand   Toilet Transfer: Minimal assistance;RW;BSC;+2 for physical assistance   Toileting- Clothing Manipulation and Hygiene: Total assistance;Sit to/from stand       Functional mobility during ADLs: (unable to ambulate this visit)       Vision Baseline Vision/History: Wears glasses Patient Visual Report: No change from baseline       Perception     Praxis      Pertinent Vitals/Pain Pain Assessment: No/denies pain Pain Score: 2  Pain Location: buttocks Pain Descriptors / Indicators: Discomfort Pain Intervention(s): Monitored during session     Hand Dominance Right   Extremity/Trunk Assessment Upper Extremity Assessment Upper Extremity Assessment: Generalized weakness   Lower Extremity Assessment Lower Extremity Assessment: Defer to PT evaluation   Cervical / Trunk Assessment Cervical / Trunk Assessment: Kyphotic  Communication Communication Communication: No difficulties   Cognition Arousal/Alertness: Awake/alert Behavior During Therapy: WFL for tasks assessed/performed Overall Cognitive Status: Within  Functional Limits for tasks assessed                                     General Comments       Exercises     Shoulder Instructions      Home Living Family/patient expects to be discharged to:: Private residence Living Arrangements: Spouse/significant other Available Help at Discharge: Family;Available 24 hours/day Type of Home: House Home Access: Stairs to enter [employer] of Steps: 1 and "a half"   Home Layout: One level     Bathroom Shower/Tub: Occupational psychologist: Handicapped height     Home Equipment: [place] - single point;Walker - 4 wheels;Walker - 2 wheels   Additional Comments: inherited rollator      Prior Functioning/Environment Level of Independence: Independent;Independent with assistive device(s)        Comments: amb with cane or rollator depending on the day        OT Problem List: Decreased strength;Decreased activity tolerance;Impaired balance (sitting and/or standing);Decreased knowledge of use of DME or AE;Obesity      OT Treatment/Interventions: Self-care/ADL training;DME and/or AE instruction;Therapeutic activities;Patient/family education;Balance training    OT Goals(Current goals can be found in the care plan section) Acute Rehab OT Goals Patient Stated Goal: home soon OT Goal Formulation: With patient Time For Goal Achievement: 07/05/17 Potential to Achieve Goals: Good ADL Goals Pt Will Perform Grooming: with min assist;standing Pt Will Perform Upper Body Dressing: with set-up;sitting Pt Will Perform Lower Body Dressing: with min assist;sit to/from stand Pt Will Transfer to Toilet: with min assist;ambulating;bedside commode Pt Will Perform Toileting - Clothing Manipulation and hygiene: with min assist;sit to/from stand Pt Will Perform Tub/Shower Transfer: with min assist;ambulating;3 in 1;rolling walker  OT Frequency: Min 2X/week   Barriers to D/C:            Co-evaluation               AM-PAC PT "6 Clicks" Daily Activity     Outcome Measure Help from another person eating meals?: None Help from another person taking care of personal grooming?: A Little Help from another person toileting, which includes using toliet, bedpan, or urinal?: Total Help from another person bathing (including washing, rinsing, drying)?: A Lot Help from another person to put on and taking off regular upper body clothing?: A Little Help from another person to put on and taking off regular lower body clothing?: Total 6 Click Score: 14   End of Session Equipment Utilized During Treatment: Rolling walker;Oxygen Nurse Communication: Mobility status  Activity Tolerance: Patient limited by fatigue Patient left:    OT Visit Diagnosis: Unsteadiness on feet (R26.81);Muscle weakness (generalized) (M62.81);History of falling (Z91.81)                Time: 0630-1601 OT Time Calculation (min): 23 min Charges:  OT General Charges $OT Visit: 1 Visit OT Evaluation $OT Eval Moderate Complexity: 1 Mod OT Treatments $Self Care/Home Management : 8-22 mins G-Codes:     2017/06/30 Nestor Lewandowsky, OTR/L Pager: [phone]  Werner Lean, Haze Boyden 06/30/17, 1:14 PM

## 2017-06-22 DIAGNOSIS — G4733 Obstructive sleep apnea (adult) (pediatric): Secondary | ICD-10-CM | POA: Diagnosis not present

## 2017-06-22 DIAGNOSIS — N3000 Acute cystitis without hematuria: Secondary | ICD-10-CM | POA: Diagnosis not present

## 2017-06-22 DIAGNOSIS — Z9989 Dependence on other enabling machines and devices: Secondary | ICD-10-CM | POA: Diagnosis not present

## 2017-06-22 DIAGNOSIS — E860 Dehydration: Secondary | ICD-10-CM | POA: Diagnosis not present

## 2017-06-22 DIAGNOSIS — R531 Weakness: Secondary | ICD-10-CM | POA: Diagnosis not present

## 2017-06-22 LAB — URINE CULTURE

## 2017-06-22 LAB — GLUCOSE, CAPILLARY
Glucose-Capillary: 98 mg/dL (ref 65–99)
Glucose-Capillary: 104 mg/dL — ABNORMAL HIGH (ref 65–99)
Glucose-Capillary: 144 mg/dL — ABNORMAL HIGH (ref 65–99)
Glucose-Capillary: 91 mg/dL (ref 65–99)

## 2017-06-22 MED ORDER — CIPROFLOXACIN HCL 500 MG PO TABS
500.0000 mg | ORAL_TABLET | Freq: Two times a day (BID) | ORAL | Status: DC
  Administered 2017-06-22 – 2017-06-23 (×2): 500 mg via ORAL
  Filled 2017-06-22 (×2): qty 1

## 2017-06-22 NOTE — Progress Notes (Signed)
[facility] is able to accept patient tomorrow if they would like to privately pay until insurance authorization is received on Monday. They will reach out to patient's wife to arrange everything.   Percell Locus Ahsha Hinsley LCSWA [phone]

## 2017-06-22 NOTE — Progress Notes (Signed)
PROGRESS NOTE    [name]  VFI:[id number] DOB: 08-01-1940 DOA: 06/20/2017 PCP: [name], MD   Outpatient Specialists:     Brief Narrative:  [name] is a married male, mostly independent and at times used a cane for balance, driving until 4-16 days ago, PMH of CAD, thyroid cancer status post thyroidectomy and hypothyroid, stage III chronic kidney disease, COPD, former smoker, OSA on nightly CPAP, not on home oxygen, DM 2, GERD, HTN, RLS/Parkinson's (based on medications) presented to the ED with weakness.  He states that he was in his usual state of health until Christmas Eve when he developed high fever of 104F. His wife had some fever too. Since then he reports that his health has gone "downhill". He has urinary frequency which is new but no dysuria. Reports history of UTI approximately 5 years ago.Since then he noticed progressive weakness to the point where he started having to use a walker and noted that his handwriting was getting illegible.  Found to have UTI, improving with ABx, SNF for rehab recommended   Assessment & Plan:   Enterobacter UTI:  -received 3 days of IV ceftriaxone, will transition to oral ciprofloxacin today -urine cultures with greater than 100,000 colonies of Enterobacter -he does have symptoms of decreased urine flow and incomplete bladder emptying, may have some BPH recommended urology follow-up after discharge  Dehydration:  -due to infection and decreased oral intake -Improved, stop IV fluids today  Generalized weakness: Secondary to acute cystitis, dehydration complicating underlying Parkinson's disease/RLS and age.  - PT and OT evaluation completed, patient is unsure about rehabilitation plans to discuss this further with his wife -Social work consulted for rehabilitation  Hypoxia:  -transient in the setting of acute illness, cOPD is at baseline, wean off oxygen  COPD: Stable  -no wheezes, Continue home inhalers.  OSA: Continue  nightly CPAP.  DM 2: Hold metformin. SSI.  Essential hypertension: Continue Catapres, Hyzaar, metoprolol.  History of gout: No acute flare. Continue allopurinol and colchicine.  Hypothyroid: Continue home dose of Synthroid.  CAD: -stable, Continue aspirin, beta blockers and statins.  Parkinson's disease: Continue Sinemet. -Being evaluated for rehabilitation  RLS: Continue Requip.  DVT prophylaxis: Lovenox  Code Status:DNR Family Communication:no family at bedside Disposition Plan: possibly SNF tomorrow    Consultants:      Subjective: -feels better today, has chronic leg weakness and jerks from Parkinson's disease in the last which bother him  Objective: Vitals:   06/21/17 2106 06/21/17 2223 06/22/17 0500 06/22/17 1355  BP: (!) 151/63   (!) 155/68  Pulse: 74   75  Resp: 19     Temp: 98.4 F (36.9 C)     TempSrc: Oral     SpO2: 95% 94%  94%  Weight:   96.4 kg (212 lb 8.4 oz)   Height:        Intake/Output Summary (Last 24 hours) at 06/22/2017 1418 Last data filed at 06/22/2017 0906 Gross per 24 hour  Intake 580 ml  Output -  Net 580 ml   Filed Weights   06/20/17 1125 06/22/17 0500  Weight: 101.2 kg (223 lb) 96.4 kg (212 lb 8.4 oz)    Examination:  Gen: Awake, Alert, Oriented X 3,  HEENT: PERRLA, Neck supple, no JVD Lungs: Good air movement bilaterally, CTAB CVS: RRR,No Gallops,Rubs or new Murmurs Abd: soft, Non tender, non distended, BS present Extremities: No Cyanosis, Clubbing or edema Skin: no new rashes   Data Reviewed: I have personally reviewed  following labs and imaging studies  CBC: Recent Labs  Lab 06/20/17 1128  WBC 11.7*  HGB 13.8  HCT 42.5  MCV 85.9  PLT 469   Basic Metabolic Panel: Recent Labs  Lab 06/20/17 1128  NA 138  K 3.7  CL 101  CO2 25  GLUCOSE 114*  BUN 29*  CREATININE 0.90  CALCIUM 9.7   GFR: Estimated Creatinine Clearance: 77.2 mL/min (by C-G formula based on SCr of 0.9 mg/dL). Liver Function  Tests: No results for input(s): AST, ALT, ALKPHOS, BILITOT, PROT, ALBUMIN in the last 168 hours. No results for input(s): LIPASE, AMYLASE in the last 168 hours. No results for input(s): AMMONIA in the last 168 hours. Coagulation Profile: No results for input(s): INR, PROTIME in the last 168 hours. Cardiac Enzymes: No results for input(s): CKTOTAL, CKMB, CKMBINDEX, TROPONINI in the last 168 hours. BNP (last 3 results) Recent Labs    12/26/16 1208  PROBNP 86.0   HbA1C: No results for input(s): HGBA1C in the last 72 hours. CBG: Recent Labs  Lab 06/21/17 1216 06/21/17 1739 06/21/17 2059 06/22/17 0816 06/22/17 1149  GLUCAP 96 87 168* 98 104*   Lipid Profile: No results for input(s): CHOL, HDL, LDLCALC, TRIG, CHOLHDL, LDLDIRECT in the last 72 hours. Thyroid Function Tests: No results for input(s): TSH, T4TOTAL, FREET4, T3FREE, THYROIDAB in the last 72 hours. Anemia Panel: No results for input(s): VITAMINB12, FOLATE, FERRITIN, TIBC, IRON, RETICCTPCT in the last 72 hours. Urine analysis:    Component Value Date/Time   COLORURINE YELLOW 06/20/2017 1129   APPEARANCEUR TURBID (A) 06/20/2017 1129   LABSPEC 1.020 06/20/2017 1129   PHURINE 5.5 06/20/2017 1129   GLUCOSEU NEGATIVE 06/20/2017 1129   HGBUR TRACE (A) 06/20/2017 1129   BILIRUBINUR NEGATIVE 06/20/2017 1129   KETONESUR NEGATIVE 06/20/2017 1129   PROTEINUR NEGATIVE 06/20/2017 1129   UROBILINOGEN 1.0 09/09/2010 1045   NITRITE POSITIVE (A) 06/20/2017 1129   LEUKOCYTESUR LARGE (A) 06/20/2017 1129     ) Recent Results (from the past 240 hour(s))  Urine Culture     Status: Abnormal   Collection Time: 06/20/17 11:29 AM  Result Value Ref Range Status   Specimen Description URINE, CLEAN CATCH  Final   Special Requests NONE  Final   Culture >=100,000 COLONIES/mL ENTEROBACTER AEROGENES (A)  Final   Report Status 06/22/2017 FINAL  Final   Organism ID, Bacteria ENTEROBACTER AEROGENES (A)  Final      Susceptibility    Enterobacter aerogenes - MIC*    CEFAZOLIN >=64 RESISTANT Resistant     CEFTRIAXONE 8 SENSITIVE Sensitive     CIPROFLOXACIN <=0.25 SENSITIVE Sensitive     GENTAMICIN <=1 SENSITIVE Sensitive     IMIPENEM 1 SENSITIVE Sensitive     NITROFURANTOIN 64 INTERMEDIATE Intermediate     TRIMETH/SULFA <=20 SENSITIVE Sensitive     PIP/TAZO 16 SENSITIVE Sensitive     * >=100,000 COLONIES/mL ENTEROBACTER AEROGENES      Anti-infectives (From admission, onward)   Start     Dose/Rate Route Frequency Ordered Stop   06/21/17 1500  cefTRIAXone (ROCEPHIN) 1 g in dextrose 5 % 50 mL IVPB     1 g 100 mL/hr over 30 Minutes Intravenous Every 24 hours 06/20/17 1743     06/20/17 1500  cefTRIAXone (ROCEPHIN) 1 g in dextrose 5 % 50 mL IVPB     1 g 100 mL/hr over 30 Minutes Intravenous  Once 06/20/17 1453 06/20/17 1702       Radiology Studies: Dg Chest 2 View  Result Date: 06/20/2017 CLINICAL DATA:  77 year old male with measures a congestion, weakness and fever, with worsening weakness. EXAM: CHEST  2 VIEW COMPARISON:  Chest x-ray 08/03/2010. FINDINGS: Low lung volumes. Linear scarring in the left lower lobe, similar to prior study from 2012. No acute consolidative airspace disease. No pleural effusions. No evidence of pulmonary edema. No definite suspicious appearing pulmonary nodules or masses. Heart size is normal. The patient is rotated to the right on today's exam, resulting in distortion of the mediastinal contours and reduced diagnostic sensitivity and specificity for mediastinal pathology. IMPRESSION: 1. Low lung volumes without radiographic evidence of acute cardiopulmonary disease. 2. Persistent scarring the left lower lobe, similar to prior study from 2012. Electronically Signed   By: Vinnie Langton M.D.   On: 06/20/2017 15:38        Scheduled Meds: . allopurinol  300 mg Oral Daily  . aspirin  325 mg Oral Daily  . budesonide (PULMICORT) nebulizer solution  0.5 mg Nebulization BID  .  calcium-vitamin D  1 tablet Oral Q breakfast  . carbidopa-levodopa  1 tablet Oral BID  . cholecalciferol  1,000 Units Oral Daily  . cloNIDine  0.1 mg Oral BID  . colchicine  0.6 mg Oral Daily  . enoxaparin (LOVENOX) injection  40 mg Subcutaneous Q24H  . losartan  100 mg Oral Daily   And  . hydrochlorothiazide  25 mg Oral Daily  . insulin aspart  0-9 Units Subcutaneous TID WC  . latanoprost  1 drop Both Eyes QHS  . levothyroxine  200 mcg Oral QAC breakfast  . metoprolol tartrate  50 mg Oral BID  . multivitamin with minerals  1 tablet Oral Daily  . omega-3 acid ethyl esters  3 g Oral Daily  . pantoprazole  40 mg Oral QPM  . potassium chloride SA  20 mEq Oral Daily  . rOPINIRole  1 mg Oral Daily  . rOPINIRole  4 mg Oral QHS  . simvastatin  20 mg Oral QPM  . umeclidinium-vilanterol  1 puff Inhalation Daily   Continuous Infusions: . cefTRIAXone (ROCEPHIN)  IV Stopped (06/21/17 1515)     LOS: 0 days    Time spent: 35 min    Domenic Polite, MD [facility] Page via [email or web address], password [email or web address]  If 7PM-7AM, please contact night-coverage  06/22/2017, 2:18 PM

## 2017-06-22 NOTE — Clinical Social Work Note (Signed)
Clinical Social Work Assessment  Patient Details  Name: [name] MRN: [id number] Date of Birth: [birth year]  Date of referral:  06/22/17               Reason for consult:  Facility Placement                Permission sought to share information with:  Facility Sport and exercise psychologist, Family Supports Permission granted to share information::  Yes, Verbal Permission Granted  Name::     [name]  Agency::  Spouse  Relationship::  SNFs  Contact Information:  [phone]  Housing/Transportation Living arrangements for the past 2 months:  [place] of Information:  Patient, Spouse Patient Interpreter Needed:  None Criminal Activity/Legal Involvement Pertinent to Current Situation/Hospitalization:  No - Comment as needed Significant Relationships:  Spouse Lives with:  Spouse Do you feel safe going back to the place where you live?  No Need for family participation in patient care:  No (Coment)  Care giving concerns:  CSW received consult for possible SNF placement at time of discharge. CSW spoke with patient and his wife regarding PT recommendation of SNF placement at time of discharge. Patient reported that patient's spouse is currently unable to care for patient at their home given patient's current physical needs and fall risk. Patient expressed understanding of PT recommendation and is agreeable to SNF placement at time of discharge. CSW to continue to follow and assist with discharge planning needs.   Social Worker assessment / plan:  CSW spoke with patient concerning possibility of rehab at [facility] before returning home.  Employment status:  Retired Nurse, adult PT Recommendations:  [place] / Referral to community resources:  [place]  Patient/Family's Response to care:  Patient recognizes need for rehab before returning home and is agreeable to a SNF in [place]. Patient reported preference  for [facility] since their family has had experience with them. CSW explained that patient's insurance would have to be authorized before patient is discharged and they are closed on the weekend. Patient's spouse stated she would be willing to pay privately until the authorization comes through if needed.   Patient/Family's Understanding of and Emotional Response to Diagnosis, Current Treatment, and Prognosis:  Patient/family is realistic regarding therapy needs and expressed being hopeful for SNF placement. Patient expressed understanding of CSW role and discharge process as well as medical condition. No questions/concerns about plan or treatment.    Emotional Assessment Appearance:  Appears stated age Attitude/Demeanor/Rapport:  Engaged, Gracious, Charismatic Affect (typically observed):  Pleasant, Accepting, Appropriate Orientation:  Oriented to Self, Oriented to Situation, Oriented to Place, Oriented to  Time Alcohol / Substance use:  Not Applicable Psych involvement (Current and /or in the community):  No (Comment)  Discharge Needs  Concerns to be addressed:  Care Coordination Readmission within the last 30 days:  No Current discharge risk:  None Barriers to Discharge:  Continued Medical Work up   [employer], [place] 06/22/2017, 3:09 PM

## 2017-06-22 NOTE — NC FL2 (Signed)
[place] LEVEL OF CARE SCREENING TOOL     IDENTIFICATION  Patient Name: [name] Birthdate: 07-18-1940 Sex: male Admission Date (Current Location): 06/20/2017  [facility] and [place] Number:  Herbalist and Address:  [facility] [place] [place] [address], [place], [place] [address]      Provider Number: [id number]  Attending Physician Name and Address:  Domenic Polite, MD  Relative Name and Phone Number:  [name], spouse, [phone]    Current Level of Care: Hospital Recommended Level of Care: [place] Prior Approval Number:    Date Approved/Denied:   PASRR Number: [id number]  Discharge Plan: SNF    Current Diagnoses: Patient Active Problem List   Diagnosis Date Noted  . Pressure injury of skin 06/21/2017  . Acute cystitis 06/20/2017  . Generalized weakness 06/20/2017  . Dehydration 06/20/2017  . Dyspnea 12/26/2016  . Chronic cough 12/26/2016  . Hypersomnia with sleep apnea 10/30/2016  . CSA (central sleep apnea) 10/30/2016  . Wheezing symptom 10/30/2016  . CPAP/BiPAP dependent 07/01/2015  . 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 RLS (restless legs syndrome) 08/25/2014  . Primary gout 08/25/2014  . OSA on CPAP 08/25/2014  . Severe obesity (BMI >= 40) ([place]) 08/25/2014  . Obesity (BMI 30-39.9) 02/18/2013  . Sleep apnea with use of continuous positive airway pressure (CPAP)   . 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 S/P TKR (total knee replacement) 11/29/2012  . History of thyroid cancer     Orientation RESPIRATION BLADDER Height & Weight     Self, Time, Situation, Place  O2(Nasal cannula 2L) Continent Weight: 96.4 kg (212 lb 8.4 oz) Height:  5\' 7"  (170.2 cm)  BEHAVIORAL SYMPTOMS/MOOD NEUROLOGICAL BOWEL NUTRITION STATUS      Continent Diet(Please see DC Summary)  AMBULATORY STATUS COMMUNICATION OF NEEDS Skin   Limited Assist Verbally PU Stage  and Appropriate Care(Stage I on sacrum)                       Personal Care Assistance Level of Assistance  Bathing, Feeding, Dressing Bathing Assistance: Limited assistance Feeding assistance: Independent Dressing Assistance: Limited assistance     Functional Limitations Info             [place]  PT (By licensed PT)     PT Frequency: 5x/week              Contractures      Additional Factors Info  Insulin Sliding Scale Code Status Info: DNR Allergies Info: NKA   Insulin Sliding Scale Info: 3x daily with meals       Current Medications (06/22/2017):  This is the current hospital active medication list Current Facility-Administered Medications  Medication Dose Route Frequency Provider Last Rate Last Dose  . acetaminophen (TYLENOL) tablet 650 mg  650 mg Oral Q6H PRN Hongalgi, Lenis Dickinson, MD       Or  . acetaminophen (TYLENOL) suppository 650 mg  650 mg Rectal Q6H PRN Hongalgi, Anand D, MD      . albuterol (PROVENTIL) (2.5 MG/3ML) 0.083% nebulizer solution 2.5 mg  2.5 mg Nebulization Q2H PRN Hongalgi, Anand D, MD      . allopurinol (ZYLOPRIM) tablet 300 mg  300 mg Oral Daily Modena Jansky, MD   300 mg at 06/22/17 1359  . aspirin tablet 325 mg  325 mg Oral Daily Hongalgi, Lenis Dickinson, MD  325 mg at 06/22/17 1358  . budesonide (PULMICORT) nebulizer solution 0.5 mg  0.5 mg Nebulization BID Vernell Leep D, MD   0.5 mg at 06/22/17 0840  . calcium-vitamin D (OSCAL WITH D) 500-200 MG-UNIT per tablet 1 tablet  1 tablet Oral Q breakfast Modena Jansky, MD   1 tablet at 06/22/17 0830  . carbidopa-levodopa (SINEMET IR) 10-100 MG per tablet immediate release 1 tablet  1 tablet Oral BID Modena Jansky, MD   1 tablet at 06/22/17 1358  . cholecalciferol (VITAMIN D) tablet 1,000 Units  1,000 Units Oral Daily Modena Jansky, MD   1,000 Units at 06/22/17 1357  . ciprofloxacin (CIPRO) tablet 500 mg  500 mg Oral BID Domenic Polite, MD      . cloNIDine  (CATAPRES) tablet 0.1 mg  0.1 mg Oral BID Modena Jansky, MD   0.1 mg at 06/22/17 1358  . colchicine tablet 0.6 mg  0.6 mg Oral Daily Vernell Leep D, MD   0.6 mg at 06/22/17 1358  . enoxaparin (LOVENOX) injection 40 mg  40 mg Subcutaneous Q24H Modena Jansky, MD   40 mg at 06/21/17 1813  . losartan (COZAAR) tablet 100 mg  100 mg Oral Daily Vernell Leep D, MD   100 mg at 06/22/17 1359   And  . hydrochlorothiazide (HYDRODIURIL) tablet 25 mg  25 mg Oral Daily Modena Jansky, MD   25 mg at 06/22/17 1358  . HYDROcodone-acetaminophen (NORCO/VICODIN) 5-325 MG per tablet 1 tablet  1 tablet Oral TID PRN Modena Jansky, MD   1 tablet at 06/22/17 0829  . insulin aspart (novoLOG) injection 0-9 Units  0-9 Units Subcutaneous TID WC Hongalgi, Anand D, MD      . latanoprost (XALATAN) 0.005 % ophthalmic solution 1 drop  1 drop Both Eyes QHS Modena Jansky, MD   1 drop at 06/21/17 2111  . levothyroxine (SYNTHROID, LEVOTHROID) tablet 200 mcg  200 mcg Oral QAC breakfast Modena Jansky, MD   200 mcg at 06/22/17 0830  . metoprolol tartrate (LOPRESSOR) tablet 50 mg  50 mg Oral BID Modena Jansky, MD   50 mg at 06/22/17 1359  . multivitamin with minerals tablet 1 tablet  1 tablet Oral Daily Modena Jansky, MD   1 tablet at 06/22/17 1359  . nitroGLYCERIN (NITROSTAT) SL tablet 0.4 mg  0.4 mg Sublingual Q5 min PRN Hongalgi, Anand D, MD      . omega-3 acid ethyl esters (LOVAZA) capsule 3 g  3 g Oral Daily Modena Jansky, MD   3 g at 06/22/17 1358  . oxymetazoline (AFRIN) 0.05 % nasal spray 2 spray  2 spray Each Nare BID PRN Eliseo Squires, Jessica U, DO      . pantoprazole (PROTONIX) EC tablet 40 mg  40 mg Oral QPM Hongalgi, Lenis Dickinson, MD   40 mg at 06/21/17 1813  . potassium chloride SA (K-DUR,KLOR-CON) CR tablet 20 mEq  20 mEq Oral Daily Modena Jansky, MD   20 mEq at 06/22/17 1358  . rOPINIRole (REQUIP) tablet 1 mg  1 mg Oral Daily Modena Jansky, MD   1 mg at 06/22/17 1358  . rOPINIRole (REQUIP)  tablet 4 mg  4 mg Oral QHS Modena Jansky, MD   4 mg at 06/21/17 2113  . simvastatin (ZOCOR) tablet 20 mg  20 mg Oral QPM Hongalgi, Anand D, MD   20 mg at 06/21/17 1813  . umeclidinium-vilanterol (ANORO ELLIPTA) 62.5-25 MCG/INH 1  puff  1 puff Inhalation Daily Modena Jansky, MD   1 puff at 06/22/17 8850     Discharge Medications: Please see discharge summary for a list of discharge medications.  Relevant Imaging Results:  Relevant Lab Results:   Additional Information SSN: [place]  [place], [place]

## 2017-06-22 NOTE — Progress Notes (Signed)
Occupational Therapy Treatment Patient Details Name: [name] MRN: [id number] DOB: 02-04-1941 Today's Date: 06/22/2017    History of present illness [name] is a married male, mostly independent and at times used a cane for balance, drives; pt presented to ED with fever, urinary frequency and weakness--->positive for UTI, also hypoxic; PMH of CAD, thyroid cancer status post thyroidectomy and hypothyroid, stage III chronic kidney disease, COPD, former smoker, OSA on nightly CPAP, not on home oxygen, DM 2, GERD, HTN, RLS.   OT comments  Pt continues to be unable to ambulate, feels as it his legs are going to buckle. Pt is weak and deconditioned. Requiring total assist for LB dressing and pericare. Highly encouraged pt to consider SNF for ST rehab. Pt is not safe to go home with the assist of his wife who is also of an advanced age.Will continue to follow.  Follow Up Recommendations  SNF;Supervision/Assistance - 24 hour(HHOT if pt declines SNF)    Equipment Recommendations  3 in 1 bedside commode;Wheelchair (measurements OT);Wheelchair cushion (measurements OT)    Recommendations for Other Services      Precautions / Restrictions Precautions Precautions: Fall Precaution Comments: knees buckle Restrictions Weight Bearing Restrictions: No       Mobility Bed Mobility Overal bed mobility: Needs Assistance Bed Mobility: Supine to Sit     Supine to sit: HOB elevated;Min guard     General bed mobility comments: use of rail, HOB up, increased time and effort  Transfers Overall transfer level: Needs assistance Equipment used: Rolling walker (2 wheeled) Transfers: Sit to/from [facility] Sit to Stand: Min assist Stand pivot transfers: Min assist       General transfer comment: assist to rise and shift weight anterior, cues or hand placement    Balance Overall balance assessment: Needs assistance;History of Falls   Sitting balance-Leahy Scale: Fair        Standing balance-Leahy Scale: Poor Standing balance comment: unable to release walker to perform pericare or pull up pants                           ADL either performed or assessed with clinical judgement   ADL Overall ADL's : Needs assistance/impaired Eating/Feeding: Independent;Sitting   Grooming: Wash/dry hands;Wash/dry face;Sitting;Set up           Upper Body Dressing : Minimal assistance;Sitting Upper Body Dressing Details (indicate cue type and reason): changed gown Lower Body Dressing: Total assistance;Sit to/from stand Lower Body Dressing Details (indicate cue type and reason): changed adult diaper     Toileting- Clothing Manipulation and Hygiene: Total assistance;Sit to/from stand       Functional mobility during ADLs: Minimal assistance;Rolling walker(only able to transfer)       Vision       Perception     Praxis      Cognition Arousal/Alertness: Awake/alert Behavior During Therapy: WFL for tasks assessed/performed Overall Cognitive Status: Within Functional Limits for tasks assessed                                          Exercises     Shoulder Instructions       General Comments      Pertinent Vitals/ Pain       Pain Assessment: Faces Faces Pain Scale: Hurts a little bit Pain Location: buttocks Pain Descriptors /  Indicators: Discomfort Pain Intervention(s): Repositioned;Monitored during session;Other (comment)(pt has geomat)  Home Living                                          Prior Functioning/Environment              Frequency  Min 2X/week        Progress Toward Goals  OT Goals(current goals can now be found in the care plan section)  Progress towards OT goals: Not progressing toward goals - comment(continues to need extensive assist, unable to walk)  Acute Rehab OT Goals Patient Stated Goal: home soon OT Goal Formulation: With patient Time For Goal Achievement:  07/05/17 Potential to Achieve Goals: Good  Plan Discharge plan remains appropriate    Co-evaluation                 AM-PAC PT "6 Clicks" Daily Activity     Outcome Measure   Help from another person eating meals?: None Help from another person taking care of personal grooming?: A Little Help from another person toileting, which includes using toliet, bedpan, or urinal?: Total Help from another person bathing (including washing, rinsing, drying)?: A Lot Help from another person to put on and taking off regular upper body clothing?: A Little Help from another person to put on and taking off regular lower body clothing?: Total 6 Click Score: 14    End of Session Equipment Utilized During Treatment: Rolling walker;Oxygen;Gait belt  OT Visit Diagnosis: Unsteadiness on feet (R26.81);Muscle weakness (generalized) (M62.81);History of falling (Z91.81)   Activity Tolerance Patient limited by fatigue   Patient Left in chair;with call bell/phone within reach   Nurse Communication          Time: 7494-4967 OT Time Calculation (min): 26 min  Charges: OT General Charges $OT Visit: 1 Visit OT Treatments $Self Care/Home Management : 23-37 mins  06/22/2017 Nestor Lewandowsky, OTR/L Pager: [phone] Werner Lean [place] 06/22/2017, 9:36 AM

## 2017-06-23 DIAGNOSIS — G4733 Obstructive sleep apnea (adult) (pediatric): Secondary | ICD-10-CM

## 2017-06-23 DIAGNOSIS — G2581 Restless legs syndrome: Secondary | ICD-10-CM | POA: Diagnosis not present

## 2017-06-23 DIAGNOSIS — E860 Dehydration: Secondary | ICD-10-CM | POA: Diagnosis not present

## 2017-06-23 DIAGNOSIS — R531 Weakness: Secondary | ICD-10-CM | POA: Diagnosis not present

## 2017-06-23 DIAGNOSIS — N3000 Acute cystitis without hematuria: Secondary | ICD-10-CM

## 2017-06-23 DIAGNOSIS — Z9989 Dependence on other enabling machines and devices: Secondary | ICD-10-CM | POA: Diagnosis not present

## 2017-06-23 LAB — BASIC METABOLIC PANEL
Anion gap: 9 (ref 5–15)
BUN: 23 mg/dL — ABNORMAL HIGH (ref 6–20)
CHLORIDE: 101 mmol/L (ref 101–111)
CO2: 32 mmol/L (ref 22–32)
Calcium: 9.7 mg/dL (ref 8.9–10.3)
Creatinine, Ser: 0.89 mg/dL (ref 0.61–1.24)
GFR calc non Af Amer: >60 mL/min (ref >60)
Glucose, Bld: 114 mg/dL — ABNORMAL HIGH (ref 65–99)
POTASSIUM: 3.7 mmol/L (ref 3.5–5.1)
SODIUM: 142 mmol/L (ref 135–145)

## 2017-06-23 LAB — CBC
HCT: 42.3 % (ref 39.0–52.0)
HEMOGLOBIN: 13.7 g/dL (ref 13.0–17.0)
MCH: 28.5 pg (ref 26.0–34.0)
MCHC: 32.4 g/dL (ref 30.0–36.0)
MCV: 88.1 fL (ref 78.0–100.0)
Platelets: 264 10*3/uL (ref 150–400)
RBC: 4.8 MIL/uL (ref 4.22–5.81)
RDW: 15.3 % (ref 11.5–15.5)
WBC: 11.7 10*3/uL — AB (ref 4.0–10.5)

## 2017-06-23 LAB — GLUCOSE, CAPILLARY
GLUCOSE-CAPILLARY: 101 mg/dL — AB (ref 65–99)
GLUCOSE-CAPILLARY: 135 mg/dL — AB (ref 65–99)

## 2017-06-23 MED ORDER — CIPROFLOXACIN HCL 500 MG PO TABS: 500.0000 mg | ORAL_TABLET | Freq: Two times a day (BID) | ORAL | 0 refills | Status: AC

## 2017-06-23 NOTE — Progress Notes (Signed)
Clinical Social Worker facilitated patient discharge including contacting patient family and facility to confirm patient discharge plans.  Clinical information faxed to facility and family agreeable with plan.  CSW arranged ambulance transport via [facility] to [facility] .  RN to call [phone] for report prior to discharge.  Clinical Social Worker will sign off for now as social work intervention is no longer needed. Please consult [place] again if new need arises.  Rhea Pink, MSW, [place]

## 2017-06-23 NOTE — Discharge Summary (Addendum)
Physician Discharge Summary  ANSELM AUMILLER XNA:[id number] DOB: [birth year] DOA: 06/20/2017  PCP: [name], MD  Admit date: 06/20/2017 Discharge date: 06/23/2017  Admitted From: Home Disposition: Skilled nursing facility  Recommendations for Outpatient Follow-up:  1. Follow up with PCP in 1-2 weeks 2. Please obtain BMP/CBC in one week 3. Please follow up on the following pending results:  Discharge Condition: Stable CODE STATUS: DNR Diet recommendation: Heart Healthy / Carb Modified  Brief/Interim Summary: [name] a married male, mostly independent and at times used a cane for balance, driving until 5-42 days ago, PMH of CAD, thyroid cancer status post thyroidectomy and hypothyroid, stage III chronic kidney disease, COPD, former smoker, OSA on nightly CPAP, not on home oxygen, DM 2, GERD, HTN, RLS/Parkinson's (based on medications) presented to the ED with weakness. He states that he was in his usual state of health until Christmas Eve when he developed high fever of 104F. His wife had some fevertoo. Since then he reports that his health has gone "downhill". He has urinary frequency which is new but no dysuria. Reports history of UTI approximately 5 years ago.Since then he noticed progressive weakness to the point where he started having to use a walker and noted that his handwriting was getting illegible.  Found to have UTI, improving with IV ceftriaxone.  He has been transitioned to oral ciprofloxacin. urine cultures with greater than 100,000 colonies of Enterobacter sensitive to cipro. He does have symptoms of decreased urine flow and incomplete bladder emptying, may have some BPH recommended urology follow-up after discharge. He will continue his home medications for his COPD, diabetes mellitus, hypertension, hypothyroidism, coronary artery disease, Parkinson's disease.  He otherwise says he feels a lot better and is asking to be discharged to the skilled nursing facility.  Vitals,  physical examination at the time of discharge fairly stable.  Discharge Diagnoses:  Principal Problem:   Acute cystitis Active Problems:   RLS (restless legs syndrome)   OSA on CPAP   Generalized weakness   Dehydration   Pressure injury of skin  Discharge Instructions Discharge medically discussed with the patient.  Advised him that he will need to follow-up with outpatient urology because of concern for benign prostatic hypertrophy.  Allergies as of 06/23/2017   No Known Allergies     Medication List    STOP taking these medications   cefdinir 300 MG capsule Commonly known as:  OMNICEF   TRELEGY ELLIPTA 100-62.5-25 MCG/INH Aepb Generic drug:  Fluticasone-Umeclidin-Vilant     TAKE these medications   allopurinol 300 MG tablet Commonly known as:  ZYLOPRIM Take 300 mg by mouth daily.   ANORO ELLIPTA 62.5-25 MCG/INH Aepb Generic drug:  umeclidinium-vilanterol Inhale 1 puff into the lungs daily.   ARNUITY ELLIPTA 200 MCG/ACT Aepb Generic drug:  Fluticasone Furoate Inhale 200 mcg into the lungs daily.   ARNUITY ELLIPTA 100 MCG/ACT Aepb Generic drug:  Fluticasone Furoate Inhale 1 puff into the lungs daily.   aspirin 325 MG tablet Take 325 mg by mouth daily.   calcium citrate-vitamin D 315-200 MG-UNIT tablet Commonly known as:  CITRACAL+D Take 1 tablet by mouth daily. 750 mg   carbidopa-levodopa 10-100 MG tablet Commonly known as:  SINEMET IR Take 1 tablet by mouth 2 (two) times daily.   cholecalciferol 1000 units tablet Commonly known as:  VITAMIN D Take 1,000 Units by mouth daily.   ciprofloxacin 500 MG tablet Commonly known as:  CIPRO Take 1 tablet (500 mg total) by mouth 2 (two)  times daily for 5 days.   cloNIDine 0.1 MG tablet Commonly known as:  CATAPRES Take 0.1 mg by mouth 2 (two) times daily.   COLCRYS 0.6 MG tablet Generic drug:  colchicine Take 0.6 mg by mouth daily.   fish oil-omega-3 fatty acids 1000 MG capsule Take 3 g by mouth daily.    guaifenesin 400 MG Tabs tablet Commonly known as:  HUMIBID E Take 400 mg by mouth 2 (two) times daily as needed (congestion).   HYDROcodone-acetaminophen 5-325 MG tablet Commonly known as:  NORCO/VICODIN Take 1 tablet by mouth 3 (three) times daily.   levothyroxine 200 MCG tablet Commonly known as:  SYNTHROID, LEVOTHROID Take 200 mcg by mouth daily before breakfast.   losartan-hydrochlorothiazide 100-25 MG tablet Commonly known as:  HYZAAR Take 1 tablet by mouth daily.   metformin 500 MG (OSM) 24 hr tablet Commonly known as:  FORTAMET Take 500 mg by mouth 2 (two) times daily with a meal.   metoprolol tartrate 50 MG tablet Commonly known as:  LOPRESSOR Take 50 mg by mouth 2 (two) times daily.   multivitamin tablet Take 1 tablet by mouth daily.   nitroGLYCERIN 0.4 MG SL tablet Commonly known as:  NITROSTAT Place 0.4 mg every 5 (five) minutes as needed under the tongue for chest pain.   oxymetazoline 0.05 % nasal spray Commonly known as:  AFRIN Place 2 sprays into the nose 2 (two) times daily as needed for congestion.   pantoprazole 40 MG tablet Commonly known as:  PROTONIX Take 40 mg by mouth every evening.   potassium chloride SA 20 MEQ tablet Commonly known as:  K-DUR,KLOR-CON Take 20 mEq by mouth daily. For seven days   rizatriptan 10 MG tablet Commonly known as:  MAXALT Take 10 mg by mouth as needed for migraine. May repeat in 2 hours if needed   rOPINIRole 1 MG tablet Commonly known as:  REQUIP TAKE 1 TABLET BY MOUTH IN THE MORNING AND 4 TABLETS AT BEDTIME What changed:  See the new instructions.   simvastatin 20 MG tablet Commonly known as:  ZOCOR Take 20 mg by mouth every evening.   TRAVATAN Z 0.004 % Soln ophthalmic solution Generic drug:  Travoprost (BAK Free) Place 1 drop into both eyes.   VENTOLIN HFA 108 (90 Base) MCG/ACT inhaler Generic drug:  albuterol Inhale 1-2 puffs into the lungs every 4 (four) hours as needed for wheezing or shortness  of breath.      Contact information for after-discharge care    Destination    [facility] .   Service:  Skilled Nursing Contact information: [place] [phone]             No Known Allergies  Consultations: None.  Procedures/Studies: Dg Chest 2 View  Result Date: 06/20/2017 CLINICAL DATA:  77 year old male with measures a congestion, weakness and fever, with worsening weakness. EXAM: CHEST  2 VIEW COMPARISON:  Chest x-ray 08/03/2010. FINDINGS: Low lung volumes. Linear scarring in the left lower lobe, similar to prior study from 2012. No acute consolidative airspace disease. No pleural effusions. No evidence of pulmonary edema. No definite suspicious appearing pulmonary nodules or masses. Heart size is normal. The patient is rotated to the right on today's exam, resulting in distortion of the mediastinal contours and reduced diagnostic sensitivity and specificity for mediastinal pathology. IMPRESSION: 1. Low lung volumes without radiographic evidence of acute cardiopulmonary disease. 2. Persistent scarring the left lower lobe, similar to prior  study from 2012. Electronically Signed   By: Vinnie Langton M.D.   On: 06/20/2017 15:38     Subjective: Patient feels a lot better and is asking to be discharged.  Discharge Exam: Vitals:   06/23/17 0522 06/23/17 1119  BP: (!) 163/70 (!) 138/58  Pulse: (!) 57 61  Resp: 17 15  Temp: (!) 97.4 F (36.3 C) (!) 97.4 F (36.3 C)  SpO2: 94% 98%   Vitals:   06/22/17 2303 06/23/17 0453 06/23/17 0522 06/23/17 1119  BP:   (!) 163/70 (!) 138/58  Pulse:   (!) 57 61  Resp:   17 15  Temp:   (!) 97.4 F (36.3 C) (!) 97.4 F (36.3 C)  TempSrc:   Axillary Oral  SpO2: 92%  94% 98%  Weight:  97.4 kg (214 lb 11.7 oz)    Height:        General: Pt is alert, awake, not in acute distress Cardiovascular: RRR, S1/S2 +, no rubs, no gallops Respiratory: CTA bilaterally, no  wheezing, no rhonchi Abdominal: Soft, NT, ND, bowel sounds + Extremities: no edema, no cyanosis    The results of significant diagnostics from this hospitalization (including imaging, microbiology, ancillary and laboratory) are listed below for reference.     Microbiology: Recent Results (from the past 240 hour(s))  Urine Culture     Status: Abnormal   Collection Time: 06/20/17 11:29 AM  Result Value Ref Range Status   Specimen Description URINE, CLEAN CATCH  Final   Special Requests NONE  Final   Culture >=100,000 COLONIES/mL ENTEROBACTER AEROGENES (A)  Final   Report Status 06/22/2017 FINAL  Final   Organism ID, Bacteria ENTEROBACTER AEROGENES (A)  Final      Susceptibility   Enterobacter aerogenes - MIC*    CEFAZOLIN >=64 RESISTANT Resistant     CEFTRIAXONE 8 SENSITIVE Sensitive     CIPROFLOXACIN <=0.25 SENSITIVE Sensitive     GENTAMICIN <=1 SENSITIVE Sensitive     IMIPENEM 1 SENSITIVE Sensitive     NITROFURANTOIN 64 INTERMEDIATE Intermediate     TRIMETH/SULFA <=20 SENSITIVE Sensitive     PIP/TAZO 16 SENSITIVE Sensitive     * >=100,000 COLONIES/mL ENTEROBACTER AEROGENES     Labs: BNP (last 3 results) No results for input(s): BNP in the last 8760 hours. Basic Metabolic Panel: Recent Labs  Lab 06/20/17 1128 06/23/17 0446  NA 138 142  K 3.7 3.7  CL 101 101  CO2 25 32  GLUCOSE 114* 114*  BUN 29* 23*  CREATININE 0.90 0.89  CALCIUM 9.7 9.7   Liver Function Tests: No results for input(s): AST, ALT, ALKPHOS, BILITOT, PROT, ALBUMIN in the last 168 hours. No results for input(s): LIPASE, AMYLASE in the last 168 hours. No results for input(s): AMMONIA in the last 168 hours. CBC: Recent Labs  Lab 06/20/17 1128 06/23/17 0446  WBC 11.7* 11.7*  HGB 13.8 13.7  HCT 42.5 42.3  MCV 85.9 88.1  PLT 293 264   Cardiac Enzymes: No results for input(s): CKTOTAL, CKMB, CKMBINDEX, TROPONINI in the last 168 hours. BNP: Invalid input(s): POCBNP CBG: Recent Labs  Lab  06/22/17 1149 06/22/17 1648 06/22/17 2205 06/23/17 0832 06/23/17 1113  GLUCAP 104* 144* 91 101* 135*   D-Dimer No results for input(s): DDIMER in the last 72 hours. Hgb A1c No results for input(s): HGBA1C in the last 72 hours. Lipid Profile No results for input(s): CHOL, HDL, LDLCALC, TRIG, CHOLHDL, LDLDIRECT in the last 72 hours. Thyroid function studies No results for input(s):  TSH, T4TOTAL, T3FREE, THYROIDAB in the last 72 hours.  Invalid input(s): FREET3 Anemia work up No results for input(s): VITAMINB12, FOLATE, FERRITIN, TIBC, IRON, RETICCTPCT in the last 72 hours. Urinalysis    Component Value Date/Time   COLORURINE YELLOW 06/20/2017 1129   APPEARANCEUR TURBID (A) 06/20/2017 1129   LABSPEC 1.020 06/20/2017 1129   PHURINE 5.5 06/20/2017 1129   GLUCOSEU NEGATIVE 06/20/2017 1129   HGBUR TRACE (A) 06/20/2017 1129   BILIRUBINUR NEGATIVE 06/20/2017 1129   KETONESUR NEGATIVE 06/20/2017 1129   PROTEINUR NEGATIVE 06/20/2017 1129   UROBILINOGEN 1.0 09/09/2010 1045   NITRITE POSITIVE (A) 06/20/2017 1129   LEUKOCYTESUR LARGE (A) 06/20/2017 1129   Sepsis Labs Invalid input(s): PROCALCITONIN,  WBC,  LACTICIDVEN Microbiology Recent Results (from the past 240 hour(s))  Urine Culture     Status: Abnormal   Collection Time: 06/20/17 11:29 AM  Result Value Ref Range Status   Specimen Description URINE, CLEAN CATCH  Final   Special Requests NONE  Final   Culture >=100,000 COLONIES/mL ENTEROBACTER AEROGENES (A)  Final   Report Status 06/22/2017 FINAL  Final   Organism ID, Bacteria ENTEROBACTER AEROGENES (A)  Final      Susceptibility   Enterobacter aerogenes - MIC*    CEFAZOLIN >=64 RESISTANT Resistant     CEFTRIAXONE 8 SENSITIVE Sensitive     CIPROFLOXACIN <=0.25 SENSITIVE Sensitive     GENTAMICIN <=1 SENSITIVE Sensitive     IMIPENEM 1 SENSITIVE Sensitive     NITROFURANTOIN 64 INTERMEDIATE Intermediate     TRIMETH/SULFA <=20 SENSITIVE Sensitive     PIP/TAZO 16 SENSITIVE  Sensitive     * >=100,000 COLONIES/mL ENTEROBACTER AEROGENES     Time coordinating discharge: Over 30 minutes  SIGNED:   Yaakov Guthrie MD  [facility] 06/23/2017, 1:19 PM Pager [phone]  If 7PM-7AM, please contact night-coverage [email or web address] Password [email or web address]

## 2017-06-23 NOTE — Progress Notes (Signed)
PROGRESS NOTE    [name]  ZOX:[id number] DOB: Nov 10, 1940 DOA: 06/20/2017 PCP: [name], MD   Outpatient Specialists:     Brief Narrative:  [name] is a married male, mostly independent and at times used a cane for balance, driving until 8-11 days ago, PMH of CAD, thyroid cancer status post thyroidectomy and hypothyroid, stage III chronic kidney disease, COPD, former smoker, OSA on nightly CPAP, not on home oxygen, DM 2, GERD, HTN, RLS/Parkinson's (based on medications) presented to the ED with weakness.  He states that he was in his usual state of health until Christmas Eve when he developed high fever of 104F. His wife had some fever too. Since then he reports that his health has gone "downhill". He has urinary frequency which is new but no dysuria. Reports history of UTI approximately 5 years ago.Since then he noticed progressive weakness to the point where he started having to use a walker and noted that his handwriting was getting illegible.  Found to have UTI, improving with ABx, SNF for rehab recommended   Assessment & Plan:   Enterobacter UTI:  -received 3 days of IV ceftriaxone, transitioned oral ciprofloxacin -urine cultures with greater than 100,000 colonies of Enterobacter sensitive to cipro -he does have symptoms of decreased urine flow and incomplete bladder emptying, may have some BPH recommended urology follow-up after discharge  Dehydration:  -due to infection and decreased oral intake -Improved, IV fluids stopped  Generalized weakness: Secondary to acute cystitis, dehydration complicating underlying Parkinson's disease/RLS and age.  - PT and OT evaluation completed, patient agreeable to be discharged to rehab. -Social work consulted for rehabilitation  Hypoxia:  -transient in the setting of acute illness, COPD is at baseline, wean off oxygen  COPD: Stable  -no wheezes, Continue home inhalers.  OSA: Continue nightly CPAP.  DM 2: Hold metformin.  SSI.  Essential hypertension: Continue Catapres, Hyzaar, metoprolol. Monitor blood pressure and adjust medications as needed.  History of gout: No acute flare. Continue allopurinol and colchicine.  Hypothyroid: Continue home dose of Synthroid.  CAD: -stable, Continue aspirin, beta blockers and statins.  Parkinson's disease: Continue Sinemet. -Being evaluated for rehabilitation  RLS: Continue Requip.  DVT prophylaxis: Lovenox  Code Status:DNR Family Communication:no family at bedside.  Plan of care discussed with the patient Disposition Plan: Plan to discharge to skilled nursing facility. Awaiting placement.  Social worker on board.   Consultants:  None   Subjective: -Has any major complaints.  He says he feels better. Has chronic leg weakness and jerks from Parkinson's disease in the last which bother him  Objective: Vitals:   06/22/17 2135 06/22/17 2303 06/23/17 0453 06/23/17 0522  BP: 138/80   (!) 163/70  Pulse: 71   (!) 57  Resp:    17  Temp: 98.7 F (37.1 C)   (!) 97.4 F (36.3 C)  TempSrc: Oral   Axillary  SpO2: 91% 92%  94%  Weight:   97.4 kg (214 lb 11.7 oz)   Height:        Intake/Output Summary (Last 24 hours) at 06/23/2017 1059 Last data filed at 06/23/2017 0456 Gross per 24 hour  Intake 360 ml  Output 325 ml  Net 35 ml   Filed Weights   06/20/17 1125 06/22/17 0500 06/23/17 0453  Weight: 101.2 kg (223 lb) 96.4 kg (212 lb 8.4 oz) 97.4 kg (214 lb 11.7 oz)    Examination:  Gen: Awake, Alert, Oriented X 3,  HEENT: PERRLA, Neck supple, no JVD  Lungs: Good air movement bilaterally, CTAB CVS: RRR,No Gallops,Rubs or new Murmurs Abd: soft, Non tender, non distended, BS present Extremities: No Cyanosis, Clubbing or edema Skin: no new rashes   Data Reviewed: I have personally reviewed following labs and imaging studies  CBC: Recent Labs  Lab 06/20/17 1128 06/23/17 0446  WBC 11.7* 11.7*  HGB 13.8 13.7  HCT 42.5 42.3  MCV 85.9 88.1  PLT 293 062    Basic Metabolic Panel: Recent Labs  Lab 06/20/17 1128 06/23/17 0446  NA 138 142  K 3.7 3.7  CL 101 101  CO2 25 32  GLUCOSE 114* 114*  BUN 29* 23*  CREATININE 0.90 0.89  CALCIUM 9.7 9.7   GFR: Estimated Creatinine Clearance: 78.5 mL/min (by C-G formula based on SCr of 0.89 mg/dL). Liver Function Tests: No results for input(s): AST, ALT, ALKPHOS, BILITOT, PROT, ALBUMIN in the last 168 hours. No results for input(s): LIPASE, AMYLASE in the last 168 hours. No results for input(s): AMMONIA in the last 168 hours. Coagulation Profile: No results for input(s): INR, PROTIME in the last 168 hours. Cardiac Enzymes: No results for input(s): CKTOTAL, CKMB, CKMBINDEX, TROPONINI in the last 168 hours. BNP (last 3 results) Recent Labs    12/26/16 1208  PROBNP 86.0   HbA1C: No results for input(s): HGBA1C in the last 72 hours. CBG: Recent Labs  Lab 06/22/17 0816 06/22/17 1149 06/22/17 1648 06/22/17 2205 06/23/17 0832  GLUCAP 98 104* 144* 91 101*   Lipid Profile: No results for input(s): CHOL, HDL, LDLCALC, TRIG, CHOLHDL, LDLDIRECT in the last 72 hours. Thyroid Function Tests: No results for input(s): TSH, T4TOTAL, FREET4, T3FREE, THYROIDAB in the last 72 hours. Anemia Panel: No results for input(s): VITAMINB12, FOLATE, FERRITIN, TIBC, IRON, RETICCTPCT in the last 72 hours. Urine analysis:    Component Value Date/Time   COLORURINE YELLOW 06/20/2017 1129   APPEARANCEUR TURBID (A) 06/20/2017 1129   LABSPEC 1.020 06/20/2017 1129   PHURINE 5.5 06/20/2017 1129   GLUCOSEU NEGATIVE 06/20/2017 1129   HGBUR TRACE (A) 06/20/2017 1129   BILIRUBINUR NEGATIVE 06/20/2017 1129   KETONESUR NEGATIVE 06/20/2017 1129   PROTEINUR NEGATIVE 06/20/2017 1129   UROBILINOGEN 1.0 09/09/2010 1045   NITRITE POSITIVE (A) 06/20/2017 1129   LEUKOCYTESUR LARGE (A) 06/20/2017 1129     ) Recent Results (from the past 240 hour(s))  Urine Culture     Status: Abnormal   Collection Time: 06/20/17  11:29 AM  Result Value Ref Range Status   Specimen Description URINE, CLEAN CATCH  Final   Special Requests NONE  Final   Culture >=100,000 COLONIES/mL ENTEROBACTER AEROGENES (A)  Final   Report Status 06/22/2017 FINAL  Final   Organism ID, Bacteria ENTEROBACTER AEROGENES (A)  Final      Susceptibility   Enterobacter aerogenes - MIC*    CEFAZOLIN >=64 RESISTANT Resistant     CEFTRIAXONE 8 SENSITIVE Sensitive     CIPROFLOXACIN <=0.25 SENSITIVE Sensitive     GENTAMICIN <=1 SENSITIVE Sensitive     IMIPENEM 1 SENSITIVE Sensitive     NITROFURANTOIN 64 INTERMEDIATE Intermediate     TRIMETH/SULFA <=20 SENSITIVE Sensitive     PIP/TAZO 16 SENSITIVE Sensitive     * >=100,000 COLONIES/mL ENTEROBACTER AEROGENES      Anti-infectives (From admission, onward)   Start     Dose/Rate Route Frequency Ordered Stop   06/22/17 1600  ciprofloxacin (CIPRO) tablet 500 mg     500 mg Oral 2 times daily 06/22/17 1425     06/21/17  1500  cefTRIAXone (ROCEPHIN) 1 g in dextrose 5 % 50 mL IVPB  Status:  Discontinued     1 g 100 mL/hr over 30 Minutes Intravenous Every 24 hours 06/20/17 1743 06/22/17 1425   06/20/17 1500  cefTRIAXone (ROCEPHIN) 1 g in dextrose 5 % 50 mL IVPB     1 g 100 mL/hr over 30 Minutes Intravenous  Once 06/20/17 1453 06/20/17 1702       Radiology Studies: No results found.   Scheduled Meds: . allopurinol  300 mg Oral Daily  . aspirin  325 mg Oral Daily  . budesonide (PULMICORT) nebulizer solution  0.5 mg Nebulization BID  . calcium-vitamin D  1 tablet Oral Q breakfast  . carbidopa-levodopa  1 tablet Oral BID  . cholecalciferol  1,000 Units Oral Daily  . ciprofloxacin  500 mg Oral BID  . cloNIDine  0.1 mg Oral BID  . colchicine  0.6 mg Oral Daily  . enoxaparin (LOVENOX) injection  40 mg Subcutaneous Q24H  . losartan  100 mg Oral Daily   And  . hydrochlorothiazide  25 mg Oral Daily  . insulin aspart  0-9 Units Subcutaneous TID WC  . latanoprost  1 drop Both Eyes QHS  .  levothyroxine  200 mcg Oral QAC breakfast  . metoprolol tartrate  50 mg Oral BID  . multivitamin with minerals  1 tablet Oral Daily  . omega-3 acid ethyl esters  3 g Oral Daily  . pantoprazole  40 mg Oral QPM  . potassium chloride SA  20 mEq Oral Daily  . rOPINIRole  1 mg Oral Daily  . rOPINIRole  4 mg Oral QHS  . simvastatin  20 mg Oral QPM  . umeclidinium-vilanterol  1 puff Inhalation Daily   Continuous Infusions:    LOS: 0 days    Time spent: 25 min    Yaakov Guthrie, MD [facility] Page via [email or web address], password [email or web address]  If 7PM-7AM, please contact night-coverage  06/23/2017, 10:59 AM

## 2017-06-23 NOTE — Progress Notes (Signed)
Report called to [facility]. S/W Georgina Pillion. Susie Cassette RN

## 2017-07-12 ENCOUNTER — Encounter: Payer: Self-pay | Admitting: Neurology

## 2017-07-12 ENCOUNTER — Telehealth: Payer: Self-pay | Admitting: Neurology

## 2017-07-12 ENCOUNTER — Ambulatory Visit: Payer: Medicare Other | Admitting: Neurology

## 2017-07-12 VITALS — BP 147/87 | HR 73 | Ht 67.0 in | Wt 210.0 lb

## 2017-07-12 DIAGNOSIS — M21371 Foot drop, right foot: Secondary | ICD-10-CM | POA: Diagnosis not present

## 2017-07-12 DIAGNOSIS — B952 Enterococcus as the cause of diseases classified elsewhere: Secondary | ICD-10-CM

## 2017-07-12 DIAGNOSIS — M4807 Spinal stenosis, lumbosacral region: Secondary | ICD-10-CM

## 2017-07-12 DIAGNOSIS — G253 Myoclonus: Secondary | ICD-10-CM | POA: Insufficient documentation

## 2017-07-12 DIAGNOSIS — R269 Unspecified abnormalities of gait and mobility: Secondary | ICD-10-CM | POA: Insufficient documentation

## 2017-07-12 DIAGNOSIS — N390 Urinary tract infection, site not specified: Secondary | ICD-10-CM

## 2017-07-12 NOTE — Progress Notes (Signed)
[facility]   Provider:  Larey Seat, M D  Referring Provider: [name], MD Primary Care Physician:  [name], MD  Chief Complaint  Patient presents with  . Follow-up    when patient stands his knees buckle under him and when he walks they buckle. pt states spasms in bilateral legs    HPI:  [name] is a 77 y.o. male is seen here today, 07-12-2017 , for intermittent leg jerking movements as well as irregular loss of muscle tone and control of the lower extremities.  His knees may buckle without warning.  Other times he was able to walk.  He is currently residing in a nursing facility and the goal is to get him home, but his rehab has been hampered by the unpredictable exercise tolerance and participation. Around Christmas time he suffered a urinary tract infection which set him back quite a bit, on January 2 he was admitted to hospital he required IV antibiotics, on the fifth he was discharged from hospital to a rehab-nursing home, where he still stays.  While his admission diagnosis was a urosepsis-urinary tract infection he will now need a new diagnosis to continue with rehab.  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  He had 2 replacement of the left knee with in a month, dating back to the year 2012 be relieved.  There has been no surgery to the right leg neither hip nor knee have been no back surgeries or spinal interventions. The myoclonic jerks are much more dominantly expressed with the right lower extremity, but his knees buckle on both sides but not necessarily at the same time. His feet  don't lift well of the ground, especially the right.     01 May 2017.  He has again been a very compliant CPAP use of his 100% of the last 30 days daily use and 80% by hours.  Average use at times 4 hours 42 minutes, the patient uses an AutoSet between 12 and 18 cmH2O was wanted me to EPR is a residual AHI of 11.1.  The vast  majority of these residual apneas are obstructive in nature usually means that he does need a little bit more pressure.  The 95th percentile pressure was 17.5 cm water.  I would like to increase the maximum pressure to 20 cm was 2 cm EPR.  He will stay on the memory foam lined mask the so-called air touch interface.  He likes it and it has reduced air leaks significantly.  He reports that his spouse still feels that there is too much air leak however it has not woken him up she is waking him up.  His fatigue severity score today was endorsed to 22 points but his Epworth sleepiness score is still high at 15 points, the geriatric depression score was endorsed at only 1 out of 15 points. He is happy with his results, more alertness.    10-30-2016, [name] has been, as usual, and excellent compliant patient. He has used CPAP was 90% compliance and an average of 5 hours and 90 minutes daily, uses an AutoSet between 12 and 18 cm water was 1 cm EPR and hasn't residual apnea index of 5.8. This is a little higher than I like it's but I think it is related to higher air leaks. The 95th percentile pressure was 17.1, which is an unusual high pressure needs. The air leaks were remarkably high. The patient sometimes is aware  at night that there is an air leak but not every night I would like for him to be refitted to a different interface. I would like him to try an air touch fullface mask, which has no resting point on the forehead. He also endorsed 16 points on his Epworth Sleepiness Scale which is too high, and 43 points on the fatigue. His geriatric depression score on the other hand was only endorsed at one out of 15 points. He continues to use multiple medications including ropinirole 1 mg in the morning and 4 mg at night for restless leg control. Carbidopa levodopa2 times a day for RLS, he also takes metoprolol, levo-thyroxin,  Rizatriptan,  and allopurinol. Various over-the-counter medication-nutritional  supplements. RLS goes away after sinemet intake. 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  Revisit note from 07/01/2015. I me today with [name], who has an excellent compliance on his CPAP  S 9 machine. We interrogated the machine by wife 5 today here in the office and it revealed that his AHI is 6.7 with 100% compliance for days of use and an average user time of 6 hours and 6 minutes. He is using an AutoSet between 12 and 18 cm water pressure was 1 cm EPR the 95th percentile pressure is 16. He does have sometimes significant air leaks that the mask leak he is using a full face mask as evident by the pressure marks at least on his face.he cannot tolerate nasal airflow.   He has bronchitic breathing and has been short of air while talking.  His gout remains well controlled, but her mentioned how expensive his medications are , needed to achieve this control.  He reports restless legs and filled the Johns-Hopkins Questionnaire for quality of life in RLS.  The patient has been long time established, and  underwent a split study in 2011 at [facility]. His AHI was 77 per hour and he was titrated to 16 cm water pressure with a 3 cm EPR. The residual AHI became lower than 5. A download reorder from August 2013 showed 100% compliance. I was unable today to obtain the data for 2014 but asked [name] DME - [facility] here in [place]) to provide [place] with the most recent data. He got this download per [name] today.  Today the download shows been 96% compliance over the last 90 days, average time of CPAP use per day at 6 hours and 52 minutes, residual AHI is 6.9 and the sac pressure is 16 cm water with 3 cm EPR there is a moderately high air leak, the pressure is so high his mask blows off his face. He needs a new mask and headgear soon.  I would like for him to have an auto-titrator machine with a setting from 8 through 16 cm water . He states he never sleeps without his CPAP and likes the machines  effect on his sleep, rest, agility.  He has 2- 3 nocturia breaks interrupting his sleep. Bedtime is between 23 hours and midnight. Subjective getting 7-8 hours of sleep , wakes up spontaneously at 7 AM.         Review of Systems: Out of a complete 14 system review, the patient complains of only the following symptoms, and all other reviewed systems are negative.  SOB, fatigue reduced. GDS one points,  How likely are you to doze in the following situations: 0 = not likely, 1 = slight chance, 2 = moderate chance, 3 =  high chance  Sitting and Reading? 2 Watching Television?3 Sitting inactive in a public place (theater or meeting)?2 Lying down in the afternoon when circumstances permit?2 Sitting and talking to someone?1 Sitting quietly after lunch without alcohol?1 In a car, while stopped for a few minutes in traffic?0 As a passenger in a car for an hour without a break?1  Total =17 before CPAP- now 12 .FSS 38 points, shoulder pain keeps him from sleeping.   He has been more up and about now in the nursing home with rehab activities, but he has noticed both knees to buckle sometimes both at the same time sometimes 1, he has myoclonic jerks in the right leg seem to be more hip flexion jerks the hip flexor is affected, this is not a knee extension jerk.  And he does have problems to lift the right foot.  There is not much strength dorsal or plantar flexion.  It seems that the range of motion at the ankle was reduced.  Left foot is much more strong.  He adjusted his diet , his wife no longer cooks fried foods, more whole wheat , salads.  His son is a Child psychotherapist and supplies the couple with whole wheat .  Breakfast with caffeine , 2-3 cups of coffee. Non smoker,  Non drinker.      Social History   Socioeconomic History  . Marital status: Married    Spouse name: [name]  . Number of children: 2  . Years of education: COLLEGE  . Highest education level: Not on file  Social Needs  . Financial  resource strain: Not on file  . Food insecurity - worry: Not on file  . Food insecurity - inability: Not on file  . Transportation needs - medical: Not on file  . Transportation needs - non-medical: Not on file  Occupational History    Employer: RETIRED  Tobacco Use  . Smoking status: Former Smoker    Years: 40.00    Types: Cigars, Cigarettes    Last attempt to quit: 06/19/2005    Years since quitting: 12.0  . Smokeless tobacco: Never Used  . Tobacco comment: smoked 4-6 cigars daily, only smoked cigarettes socially  Substance and Sexual Activity  . Alcohol use: No  . Drug use: No  . Sexual activity: Not on file  Other Topics Concern  . Not on file  Social History Narrative   Patient is married [name]) and lives at home with his wife.   Patient has two children.   Patient is retired.   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   Patient is right-handed.   Patient drinks three cups of coffee per day.    Family History  Problem Relation Age of Onset  . COPD Mother   . Hyperlipidemia Mother   . Hypertension Mother   . Diabetes Father   . Kidney disease Father        ESRD  . Hypertension Father   . Cancer Father   . Hyperlipidemia Father     Past Medical History:  Diagnosis Date  . Barrett esophagus   . CAD (coronary artery disease)   . Cancer ([place])    thyroid  . Carotid artery occlusion   . Chronic kidney disease   . COPD (chronic obstructive pulmonary disease) ([place])   . Diabetes mellitus without complication ([place])   . GERD (gastroesophageal reflux disease)   . History of thyroid cancer 2008  . History of thyroid cancer   .  Hypertension   . OSA (obstructive sleep apnea)   . Restless leg   . Sleep apnea with use of continuous positive airway pressure (CPAP)    [facility] , AHI  77cn central and obstrcutive. 16 cm water , 3 cm EPR.   . Thyroid disease   . Ulcer    Peptic ulcer disease    Past Surgical History:  Procedure  Laterality Date  . CAROTID ENDARTERECTOMY Left January 03, 2006   Dr. Amedeo Plenty  . JOINT REPLACEMENT Left    knee X 2  . THYROIDECTOMY  2008    Current Outpatient Medications  Medication Sig Dispense Refill  . allopurinol (ZYLOPRIM) 300 MG tablet Take 300 mg by mouth daily.   12  . ANORO ELLIPTA 62.5-25 MCG/INH AEPB Inhale 1 puff into the lungs daily. 1 each 6  . ARNUITY ELLIPTA 100 MCG/ACT AEPB Inhale 1 puff into the lungs daily. 30 each 6  . aspirin 325 MG tablet Take 325 mg by mouth daily.    . calcium citrate-vitamin D (CITRACAL+D) 315-200 MG-UNIT per tablet Take 1 tablet by mouth daily. 750 mg     . carbidopa-levodopa (SINEMET IR) 10-100 MG tablet Take 1 tablet by mouth 2 (two) times daily. (Patient taking differently: Take 1 tablet by mouth 3 (three) times daily. ) 90 tablet 4  . cholecalciferol (VITAMIN D) 1000 units tablet Take 1,000 Units by mouth daily.     . cloNIDine (CATAPRES) 0.1 MG tablet Take 0.1 mg by mouth 2 (two) times daily.  8  . COLCRYS 0.6 MG tablet Take 0.6 mg by mouth daily.    . fish oil-omega-3 fatty acids 1000 MG capsule Take 3 g by mouth daily.     . Fluticasone Furoate (ARNUITY ELLIPTA) 200 MCG/ACT AEPB Inhale 200 mcg into the lungs daily.    [name] guaifenesin (HUMIBID E) 400 MG TABS tablet Take 400 mg by mouth 2 (two) times daily as needed (congestion).    [name] HYDROcodone-acetaminophen (NORCO/VICODIN) 5-325 MG per tablet Take 1 tablet by mouth 3 (three) times daily.     [name] levothyroxine (SYNTHROID, LEVOTHROID) 200 MCG tablet Take 200 mcg by mouth daily before breakfast.     . losartan-hydrochlorothiazide (HYZAAR) 100-25 MG per tablet Take 1 tablet by mouth daily.    . metformin (FORTAMET) 500 MG (OSM) 24 hr tablet Take 500 mg by mouth 2 (two) times daily with a meal.     . metFORMIN (GLUCOPHAGE) 500 MG tablet TAKE 1 TABLET BY MOUTH TWICE A DAY WITH A MEAL (DIABETES)  0  . metoprolol tartrate (LOPRESSOR) 50 MG tablet Take 50 mg by mouth 2 (two) times daily.    . Multiple  Vitamin (MULTIVITAMIN) tablet Take 1 tablet by mouth daily.    . nitroGLYCERIN (NITROSTAT) 0.4 MG SL tablet Place 0.4 mg every 5 (five) minutes as needed under the tongue for chest pain.    [name] oxymetazoline (AFRIN) 0.05 % nasal spray Place 2 sprays into the nose 2 (two) times daily as needed for congestion.     . pantoprazole (PROTONIX) 40 MG tablet Take 40 mg by mouth every evening.    . potassium chloride SA (K-DUR,KLOR-CON) 20 MEQ tablet Take 20 mEq by mouth daily. For seven days  0  . rizatriptan (MAXALT) 10 MG tablet Take 10 mg by mouth as needed for migraine. May repeat in 2 hours if needed    . rOPINIRole (REQUIP) 1 MG tablet TAKE 1 TABLET BY MOUTH IN THE MORNING AND  4 TABLETS AT BEDTIME (Patient taking differently: TAKE 1mg  BY MOUTH IN THE MORNING AND 4mg  AT BEDTIME) 150 tablet 2  . simvastatin (ZOCOR) 20 MG tablet Take 20 mg by mouth every evening.    . TRAVATAN Z 0.004 % SOLN ophthalmic solution Place 1 drop into both eyes.     . VENTOLIN HFA 108 (90 Base) MCG/ACT inhaler Inhale 1-2 puffs into the lungs every 4 (four) hours as needed for wheezing or shortness of breath.   6   No current facility-administered medications for this visit.     Allergies as of 07/12/2017  . (No Known Allergies)    Vitals: BP (!) 147/87   Pulse 73   Ht 5\' 7"  (1.702 m)   Wt 210 lb (95.3 kg)   BMI 32.89 kg/m  Last Weight:  Wt Readings from Last 1 Encounters:  07/12/17 210 lb (95.3 kg)   Last Height:   Ht Readings from Last 1 Encounters:  07/12/17 5\' 7"  (1.702 m)    Physical exam:  General: The patient is awake, alert and appears not in acute distress. The patient is well groomed. Head: Normocephalic, atraumatic. Neck is supple. Mallampati 1 wide open- very red upper airway( GERD )with a  neck circumference of  20 inches ,  no nasal deviation, but chronic tendency to have nasal airflow obstruction-rhinitis. Tonsillectomy at age 57.  Cardiovascular:  Regular rate and rhythm ,   Neurologic exam  : The patient is awake and alert, oriented to place and time.   Cranial nerves: No change in taste or smell. Pupils are equal and briskly reactive to light. Hearing to finger rub intact bilaterally . Facial sensation intact to fine touch.  Facial motor strength is symmetric, tongue and uvula move midline.  Motor exam:  No cogwheeling , no tremor. He has been more up and about now in the nursing home with rehab activities, but he has noticed both knees to buckle sometimes both at the same time sometimes one , he has myoclonic jerks in the right leg- these  seem to be more hip flexion jerks -the hip flexor is affected, this is not a knee extension jerk.   And he does have problems to lift the right foot.  There is not much strength dorsal or plantar flexion.  It seems that the range of motion at the ankle was reduced.  Left foot is much more strong.  Sensory exam reveals numbness in the fingertips but profound numbness to vibration course pressure in both feet up to the ankle level.  The bottom of the feet dorsum pedis and toes are numb to these modalities.  There is no significant ankle edema.    Deep tendon reflexes: in the upper and lower extremities are attenuated.  Babinski maneuver is equivocal, no Achilles tendon reflex is elicited.  Patellar reflexes are still present.    Assessment:  Visit duration 35 minutes -this evaluation was dedicated to determine the origin of his new onset motor or disorder, gait instability with sudden knees buckling, right myoclonic jerks with hip flexion, interestingly the myoclonic jerks picked up after physical exam mild clonus is not present during sleep to his knowledge.  Since all this started after a hospitalization for urosepsis, I would like to evaluate the area of the kidneys, the lumbar spine. He does not have Achilles tendon reflexes but they may have been attenuated for a long time. He has neuropathy, too.   I will order an MRI of the lower spine  as  well as of the right hip.  EMG and NCV in lower extremities.   I will ask Dr. Rhona Raider to evaluate the patient's lower extremity joints testing.   I will also ask Dr. [name] to evaluate the right flank area around the kidneys by ultrasound, or arrange for UROLOGY   RV in 4-6 weeks.    Cc Dr. [name]

## 2017-07-12 NOTE — Telephone Encounter (Signed)
Cassandra/[facility] [phone] called she rec'd the referral but it states it's for Dr Rhona Raider but he is not at their office. She is wanting to know if this should be scheduled with one of their providers or should the referral have been sent to another office. Please call to advise

## 2017-07-16 NOTE — Telephone Encounter (Signed)
Cassandra called back today, Hinton Dyer is out of the office today. I skyped Danielle, she looked at referral, it should be sent to Dr Rhona Raider. Cassandra was advised and she will disregard the referral. Andee Poles will forward the referral to Dr Rhona Raider office

## 2017-07-19 NOTE — Telephone Encounter (Signed)
Sent to Dr. Rhona Raider office . Telephone [phone] - fax [phone] . [facility]

## 2017-07-23 ENCOUNTER — Other Ambulatory Visit: Payer: Self-pay | Admitting: Neurology

## 2017-07-23 ENCOUNTER — Other Ambulatory Visit: Payer: Self-pay | Admitting: Orthopaedic Surgery

## 2017-07-23 DIAGNOSIS — M545 Low back pain: Secondary | ICD-10-CM

## 2017-07-24 ENCOUNTER — Ambulatory Visit
Admission: RE | Admit: 2017-07-24 | Discharge: 2017-07-24 | Disposition: A | Discharge status: Home / Self Care | Payer: Medicare Other | Source: Ambulatory Visit | Attending: Neurology | Admitting: Neurology

## 2017-07-24 DIAGNOSIS — M4807 Spinal stenosis, lumbosacral region: Secondary | ICD-10-CM

## 2017-07-26 ENCOUNTER — Encounter: Payer: Medicare Other | Admitting: Diagnostic Neuroimaging

## 2017-07-26 ENCOUNTER — Other Ambulatory Visit: Payer: Medicare Other

## 2017-07-26 ENCOUNTER — Ambulatory Visit (INDEPENDENT_AMBULATORY_CARE_PROVIDER_SITE_OTHER): Payer: Medicare Other | Admitting: Diagnostic Neuroimaging

## 2017-07-26 DIAGNOSIS — B952 Enterococcus as the cause of diseases classified elsewhere: Secondary | ICD-10-CM

## 2017-07-26 DIAGNOSIS — R269 Unspecified abnormalities of gait and mobility: Secondary | ICD-10-CM

## 2017-07-26 DIAGNOSIS — G253 Myoclonus: Secondary | ICD-10-CM | POA: Diagnosis not present

## 2017-07-26 DIAGNOSIS — M4807 Spinal stenosis, lumbosacral region: Secondary | ICD-10-CM

## 2017-07-26 DIAGNOSIS — Z0289 Encounter for other administrative examinations: Secondary | ICD-10-CM

## 2017-07-26 DIAGNOSIS — M21371 Foot drop, right foot: Secondary | ICD-10-CM

## 2017-07-26 DIAGNOSIS — N390 Urinary tract infection, site not specified: Secondary | ICD-10-CM

## 2017-07-28 ENCOUNTER — Other Ambulatory Visit: Payer: Medicare Other

## 2017-07-30 NOTE — Procedures (Signed)
[facility]  NCS (NERVE CONDUCTION STUDY) WITH EMG (ELECTROMYOGRAPHY) REPORT   STUDY DATE: 07/26/17 PATIENT NAME: [name] DOB: 08-15-1940 MRN: [id number]  ORDERING CLINICIAN: Larey Seat, MD   TECHNOLOGIST: Oneita Jolly ELECTROMYOGRAPHER: Earlean Polka. Alisha Burgo, MD  CLINICAL INFORMATION: 77 year old male with lower extremity weakness and myoclonus. Patient has lumbar spinal stenosis and foraminal stenosis on recent MRI lumbar spine.    FINDINGS: NERVE CONDUCTION STUDY: Right ulnar motor response and F wave latency are normal.  Bilateral peroneal motor responses have prolonged distal latencies, decreased amplitude, slow conduction velocities.  Right tibial motor response could not be obtained.  Left tibial motor response has normal distal latency, decreased amplitude, slow conduction velocity.  Left tibial F wave latency is prolonged.  Right radial, right ulnar and bilateral sural sensory responses are normal.  Bilateral superficial peroneal sensory responses could not be obtained.   NEEDLE ELECTROMYOGRAPHY:  Needle examination of right lower extremity vastus medialis, tibials anterior, gastrocnemius is normal except: 1+ fibrillation potentials in right tibialis anterior at rest and decreased motor unit recruitment on exertion.   IMPRESSION:   Abnormal study demonstrating: 1. Length-dependent, axonal sensorimotor polyneuropathy. 2. Mild evidence of lumbar radiculopathies based upon abnormal tibial F-wave latencies.      INTERPRETING PHYSICIAN:  Penni Bombard, MD Certified in Neurology, Neurophysiology and Neuroimaging  [facility] [address] [place], [place] [address] [phone]   [facility]    Nerve / Sites Muscle Latency Ref. Amplitude Ref. Rel Amp Segments Distance Velocity Ref. Area    ms ms mV mV %  cm m/s m/s mVms  R Ulnar - ADM     Wrist ADM 3.2 ?3.3 8.6 ?6.0 100 Wrist - ADM 7   32.6   B.Elbow ADM 6.7  8.7  101 B.Elbow - Wrist 19 55 ?49 34.4     A.Elbow ADM 8.6  8.7  99.4 A.Elbow - B.Elbow 10 51 ?49 33.1         A.Elbow - Wrist      R Peroneal - EDB     Ankle EDB 6.8 ?6.5 1.2 ?2.0 100 Ankle - EDB 9   3.7     Fib head EDB 16.2  1.1  91.2 Fib head - Ankle 30 32 ?44 3.9     Pop fossa EDB 19.4  0.9  88 Pop fossa - Fib head 10 31 ?44 4.1         Pop fossa - Ankle      L Peroneal - EDB     Ankle EDB 6.7 ?6.5 0.5 ?2.0 100 Ankle - EDB 9   2.6     Fib head EDB 16.5  0.3  73.8 Fib head - Ankle 30 31 ?44 1.7     Pop fossa EDB 19.5  0.2  57.7 Pop fossa - Fib head 10 34 ?44 0.9         Pop fossa - Ankle      R Tibial - AH     Ankle AH NR ?5.8 NR ?4.0 NR Ankle - AH 9   NR  L Tibial - AH     Ankle AH 5.7 ?5.8 3.3 ?4.0 100 Ankle - AH 9   11.5     Pop fossa AH 18.3  2.1  64.9 Pop fossa - Ankle 38 30 ?41 8.4               SNC    Nerve / Sites Rec. Site Peak Lat  Ref.  Amp Ref. Segments Distance    ms ms V V  cm  R Radial - Anatomical snuff box (Forearm)     Forearm Wrist 2.7 ?2.9 10 ?15 Forearm - Wrist 10  R Sural - Ankle (Calf)     Calf Ankle 4.2 ?4.4 5 ?6 Calf - Ankle 14  L Sural - Ankle (Calf)     Calf Ankle 4.3 ?4.4 4 ?6 Calf - Ankle 14  R Superficial peroneal - Ankle     Lat leg Ankle NR ?4.4 NR ?6 Lat leg - Ankle 14  L Superficial peroneal - Ankle     Lat leg Ankle NR ?4.4 NR ?6 Lat leg - Ankle 14  R Ulnar - Orthodromic, (Dig V, Mid palm)     Dig V Wrist 2.8 ?3.1 6 ?5 Dig V - Wrist 46                   F  Wave    Nerve F Lat Ref.   ms ms  L Tibial - AH 64.4 ?56.0  R Ulnar - ADM 25.0 ?32.0         EMG full       EMG Summary Table    Spontaneous MUAP Recruitment  Muscle IA Fib PSW Fasc Other Amp Dur. Poly Pattern  R. Vastus medialis Normal None None None _______ Normal Normal Normal Normal  R. Tibialis anterior Normal 1+ None None _______ Normal Normal Normal Reduced  R. Gastrocnemius (Medial head) Normal None None None _______ Normal Normal Normal Normal

## 2017-07-31 ENCOUNTER — Telehealth: Payer: Self-pay | Admitting: Neurology

## 2017-07-31 NOTE — Telephone Encounter (Signed)
Patient has been set up with referral and has an upcoming apt to discuss the MRI results in more detail.

## 2017-07-31 NOTE — Telephone Encounter (Signed)
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 This explains the lower extremity weakness.  I suggest to see physical rehab MD to look at options for PT/ rehab/ treatment and  intervention and discuss conservative versus operative  therapeutic options. CD

## 2017-08-09 ENCOUNTER — Telehealth: Payer: Self-pay | Admitting: Neurology

## 2017-08-09 ENCOUNTER — Ambulatory Visit: Payer: Medicare Other | Admitting: Neurology

## 2017-08-09 ENCOUNTER — Encounter: Payer: Self-pay | Admitting: Neurology

## 2017-08-09 VITALS — BP 96/62 | HR 98 | Ht 67.0 in | Wt 200.0 lb

## 2017-08-09 DIAGNOSIS — M6258 Muscle wasting and atrophy, not elsewhere classified, other site: Secondary | ICD-10-CM

## 2017-08-09 DIAGNOSIS — G629 Polyneuropathy, unspecified: Secondary | ICD-10-CM | POA: Diagnosis not present

## 2017-08-09 DIAGNOSIS — M48062 Spinal stenosis, lumbar region with neurogenic claudication: Secondary | ICD-10-CM

## 2017-08-09 DIAGNOSIS — M6281 Muscle weakness (generalized): Secondary | ICD-10-CM | POA: Diagnosis not present

## 2017-08-09 DIAGNOSIS — R159 Full incontinence of feces: Secondary | ICD-10-CM

## 2017-08-09 DIAGNOSIS — R29898 Other symptoms and signs involving the musculoskeletal system: Secondary | ICD-10-CM

## 2017-08-09 DIAGNOSIS — G253 Myoclonus: Secondary | ICD-10-CM

## 2017-08-09 DIAGNOSIS — M4804 Spinal stenosis, thoracic region: Secondary | ICD-10-CM

## 2017-08-09 DIAGNOSIS — R29818 Other symptoms and signs involving the nervous system: Secondary | ICD-10-CM

## 2017-08-09 DIAGNOSIS — R292 Abnormal reflex: Secondary | ICD-10-CM

## 2017-08-09 DIAGNOSIS — R32 Unspecified urinary incontinence: Secondary | ICD-10-CM

## 2017-08-09 NOTE — Progress Notes (Addendum)
[facility]   Provider:  Larey Seat, M D  Referring Provider: [name], MD Primary Care Physician:  [name], MD  Chief Complaint  Patient presents with  . Follow-up    pt with wife, son and grandson. pt here to discuss the imaging results and nerve conduction test.     HPI: Today is 51 February 2019 I have the pleasure of meeting [name] family today [name] is a 77 year old Caucasian right-handed male who has been seen here for ambulatory problems, he has had very little control over his lower extremities, has been wheelchair-bound, and he underwent in the meantime nerve conduction studies, EMG and lumbar spine MRI as well as an orthopedic consultation and Dr. Jodi Mourning consultation for interventional medicine.  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  This can explain atrophy of the hip musculature but it should not be the sole explanation for the patient's significant gait apraxia.  An EMG and nerve conduction study followed conducted by Dr. Delma Freeze, dated 26 July 2017 upper extremity responses were normal impression for the lower extremities there were 1+ fibrillation potentials in the right tibial anterior and decreased motor unit recruitment on exertion.  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  He also states that lumbar radiculopathy was likely present, however this is not for certain based on his physiologic responses.  Right and left peroneal nerve had similar responses the right tibial response was absent and left tibial response was preserved.  Right tibialis anterior recluse reduced recruitment pattern.  When he returned form the nursing home he needed a lift chair, for a week he had nearly no twitching. Now he is twitching al the time, his right leg  mostly, fasciculation. [name] witnessed skin fasciculations on his right thigh this morning. She mentioned complete loss of bowel and bladder control. I did not witness fasculation but myoclonus. We undressed his lower body.   [name] is a 77 y.o. male is seen here today, 07-12-2017 , for intermittent leg jerking movements as well as irregular loss of muscle tone and control of the lower extremities.  His knees may buckle without warning.  Other times he was able to walk.  He is currently residing in a nursing facility and the goal is to get him home, but his rehab has been hampered by the unpredictable exercise tolerance and participation. Around Christmas time he suffered a urinary tract infection which set him back quite a bit, on January 2 he was admitted to hospital he required IV antibiotics, on the fifth he was discharged from hospital to a rehab-nursing home, where he still stays.  While his admission diagnosis was a urosepsis-urinary tract infection he will now need a new diagnosis to continue with rehab.  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  He had 2 replacement of the left knee with in a month, dating back to the year 2012 be relieved.  There has been no surgery to the right leg neither hip nor knee have been no back surgeries or spinal interventions. The myoclonic jerks are much more dominantly expressed with the right lower extremity, but his knees buckle on both sides but not necessarily at the same time. His feet  don't lift well of the ground, especially the right.     01 May 2017.  He has  again been a very compliant CPAP use of his 100% of the last 30 days daily use and 80% by hours.  Average use at times 4 hours 42 minutes, the patient uses an AutoSet between 12 and 18 cmH2O was wanted me to EPR is a residual AHI of 11.1.  The vast majority of these residual apneas are obstructive in nature usually means that he does need a little bit more pressure.  The 95th  percentile pressure was 17.5 cm water.  I would like to increase the maximum pressure to 20 cm was 2 cm EPR.  He will stay on the memory foam lined mask the so-called air touch interface.  He likes it and it has reduced air leaks significantly.  He reports that his spouse still feels that there is too much air leak however it has not woken him up she is waking him up.  His fatigue severity score today was endorsed to 22 points but his Epworth sleepiness score is still high at 15 points, the geriatric depression score was endorsed at only 1 out of 15 points. He is happy with his results, more alertness.    10-30-2016, [name] has been, as usual, and excellent compliant patient. He has used CPAP was 90% compliance and an average of 5 hours and 90 minutes daily, uses an AutoSet between 12 and 18 cm water was 1 cm EPR and hasn't residual apnea index of 5.8. This is a little higher than I like it's but I think it is related to higher air leaks. The 95th percentile pressure was 17.1, which is an unusual high pressure needs. The air leaks were remarkably high. The patient sometimes is aware at night that there is an air leak but not every night I would like for him to be refitted to a different interface. I would like him to try an air touch fullface mask, which has no resting point on the forehead. He also endorsed 16 points on his Epworth Sleepiness Scale which is too high, and 43 points on the fatigue. His geriatric depression score on the other hand was only endorsed at one out of 15 points. He continues to use multiple medications including ropinirole 1 mg in the morning and 4 mg at night for restless leg control. Carbidopa levodopa2 times a day for RLS, he also takes metoprolol, levo-thyroxin,  Rizatriptan,  and allopurinol. Various over-the-counter medication-nutritional supplements. RLS goes away after sinemet intake. 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  Revisit note from 07/01/2015. I me  today with [name], who has an excellent compliance on his CPAP  S 9 machine. We interrogated the machine by wife 5 today here in the office and it revealed that his AHI is 6.7 with 100% compliance for days of use and an average user time of 6 hours and 6 minutes. He is using an AutoSet between 12 and 18 cm water pressure was 1 cm EPR the 95th percentile pressure is 16. He does have sometimes significant air leaks that the mask leak he is using a full face mask as evident by the pressure marks at least on his face.he cannot tolerate nasal airflow.   He has bronchitic breathing and has been short of air while talking.  His gout remains well controlled, but her mentioned how expensive his medications are , needed to achieve this control.  He reports restless legs and filled the Johns-Hopkins Questionnaire for quality of life in RLS.  The patient has been  long time established, and  underwent a split study in 2011 at [facility]. His AHI was 77 per hour and he was titrated to 16 cm water pressure with a 3 cm EPR. The residual AHI became lower than 5. A download reorder from August 2013 showed 100% compliance. I was unable today to obtain the data for 2014 but asked [name] 'es DME - [name] here in [name]) to provide [place] with the most recent data. He got this download per [name] today.  Today the download shows been 96% compliance over the last 90 days, average time of CPAP use per day at 6 hours and 52 minutes, residual AHI is 6.9 and the sac pressure is 16 cm water with 3 cm EPR there is a moderately high air leak, the pressure is so high his mask blows off his face. He needs a new mask and headgear soon.  I would like for him to have an auto-titrator machine with a setting from 8 through 16 cm water . He states he never sleeps without his CPAP and likes the machines effect on his sleep, rest, agility.  He has 2- 3 nocturia breaks interrupting his sleep. Bedtime is between 23 hours and  midnight. Subjective getting 7-8 hours of sleep , wakes up spontaneously at 7 AM.         Review of Systems: Out of a complete 14 system review, the patient complains of only the following symptoms, and all other reviewed systems are negative.  SOB, fatigue reduced. GDS one points,  He has been more up and about now in the nursing home with rehab activities, but he has noticed both knees to buckle sometimes both at the same time sometimes 1, he has myoclonic jerks in the right leg   seem to be more hip flexion jerks the hip flexor is affected, this is not a knee extension jerk.   And he does have problems to lift the right foot.  There is not much strength dorsal or plantar flexion.   It seems that the range of motion at the ankle was reduced.   He adjusted his diet , his wife no longer cooks fried foods, more whole wheat , salads.  Breakfast with caffeine , 2-3 cups of coffee. Non smoker,  Non drinker.      Social History   Socioeconomic History  . Marital status: Married    Spouse name: [name]  . Number of children: 2  . Years of education: COLLEGE  . Highest education level: Not on file  Social Needs  . Financial resource strain: Not on file  . Food insecurity - worry: Not on file  . Food insecurity - inability: Not on file  . Transportation needs - medical: Not on file  . Transportation needs - non-medical: Not on file  Occupational History    Employer: RETIRED  Tobacco Use  . Smoking status: Former Smoker    Years: 40.00    Types: Cigars, Cigarettes    Last attempt to quit: 06/19/2005    Years since quitting: 12.1  . Smokeless tobacco: Never Used  . Tobacco comment: smoked 4-6 cigars daily, only smoked cigarettes socially  Substance and Sexual Activity  . Alcohol use: No  . Drug use: No  . Sexual activity: Not on file  Other Topics Concern  . Not on file  Social History Narrative   Patient is married [name]) and lives at home with his wife.   Patient has two  children.  Patient is retired.   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   Patient is right-handed.   Patient drinks three cups of coffee per day.    Family History  Problem Relation Age of Onset  . COPD Mother   . Hyperlipidemia Mother   . Hypertension Mother   . Diabetes Father   . Kidney disease Father        ESRD  . Hypertension Father   . Cancer Father   . Hyperlipidemia Father     Past Medical History:  Diagnosis Date  . Barrett esophagus   . CAD (coronary artery disease)   . Cancer ([place])    thyroid  . Carotid artery occlusion   . Chronic kidney disease   . COPD (chronic obstructive pulmonary disease) ([place])   . Diabetes mellitus without complication ([place])   . GERD (gastroesophageal reflux disease)   . History of thyroid cancer 2008  . History of thyroid cancer   . Hypertension   . OSA (obstructive sleep apnea)   . Restless leg   . Sleep apnea with use of continuous positive airway pressure (CPAP)    2011 [facility] , AHI  77cn central and obstrcutive. 16 cm water , 3 cm EPR.   . Thyroid disease   . Ulcer    Peptic ulcer disease    Past Surgical History:  Procedure Laterality Date  . CAROTID ENDARTERECTOMY Left January 03, 2006   Dr. Amedeo Plenty  . JOINT REPLACEMENT Left    knee X 2  . THYROIDECTOMY  2008    Current Outpatient Medications  Medication Sig Dispense Refill  . allopurinol (ZYLOPRIM) 300 MG tablet Take 300 mg by mouth daily.   12  . ANORO ELLIPTA 62.5-25 MCG/INH AEPB Inhale 1 puff into the lungs daily. 1 each 6  . ARNUITY ELLIPTA 100 MCG/ACT AEPB Inhale 1 puff into the lungs daily. 30 each 6  . aspirin 325 MG tablet Take 325 mg by mouth daily.    . calcium citrate-vitamin D (CITRACAL+D) 315-200 MG-UNIT per tablet Take 1 tablet by mouth daily. 750 mg     . carbidopa-levodopa (SINEMET IR) 10-100 MG tablet Take 1 tablet by mouth 2 (two) times daily. (Patient taking differently: Take 1 tablet by mouth 3 (three) times daily. )  90 tablet 4  . cholecalciferol (VITAMIN D) 1000 units tablet Take 1,000 Units by mouth daily.     . cloNIDine (CATAPRES) 0.1 MG tablet Take 0.1 mg by mouth 2 (two) times daily.  8  . COLCRYS 0.6 MG tablet Take 0.6 mg by mouth daily.    . fish oil-omega-3 fatty acids 1000 MG capsule Take 3 g by mouth daily.     . Fluticasone Furoate (ARNUITY ELLIPTA) 200 MCG/ACT AEPB Inhale 200 mcg into the lungs daily.    [name] guaifenesin (HUMIBID E) 400 MG TABS tablet Take 400 mg by mouth 2 (two) times daily as needed (congestion).    [name] HYDROcodone-acetaminophen (NORCO/VICODIN) 5-325 MG per tablet Take 1 tablet by mouth 3 (three) times daily.     [name] levothyroxine (SYNTHROID, LEVOTHROID) 200 MCG tablet Take 200 mcg by mouth daily before breakfast.     . losartan-hydrochlorothiazide (HYZAAR) 100-25 MG per tablet Take 1 tablet by mouth daily.    . metformin (FORTAMET) 500 MG (OSM) 24 hr tablet Take 500 mg by mouth 2 (two) times daily with a meal.     . metFORMIN (GLUCOPHAGE) 500 MG tablet TAKE 1 TABLET BY  MOUTH TWICE A DAY WITH A MEAL (DIABETES)  0  . metoprolol tartrate (LOPRESSOR) 50 MG tablet Take 50 mg by mouth 2 (two) times daily.    . Multiple Vitamin (MULTIVITAMIN) tablet Take 1 tablet by mouth daily.    . nitroGLYCERIN (NITROSTAT) 0.4 MG SL tablet Place 0.4 mg every 5 (five) minutes as needed under the tongue for chest pain.    [name] oxymetazoline (AFRIN) 0.05 % nasal spray Place 2 sprays into the nose 2 (two) times daily as needed for congestion.     . pantoprazole (PROTONIX) 40 MG tablet Take 40 mg by mouth every evening.    . potassium chloride SA (K-DUR,KLOR-CON) 20 MEQ tablet Take 20 mEq by mouth daily. For seven days  0  . rizatriptan (MAXALT) 10 MG tablet Take 10 mg by mouth as needed for migraine. May repeat in 2 hours if needed    . rOPINIRole (REQUIP) 1 MG tablet TAKE 1 TABLET BY MOUTH IN THE MORNING AND 4 TABLETS AT BEDTIME (Patient taking differently: TAKE 1mg  BY MOUTH IN THE MORNING AND 4mg  AT BEDTIME)  150 tablet 2  . simvastatin (ZOCOR) 40 MG tablet   0  . TRAVATAN Z 0.004 % SOLN ophthalmic solution Place 1 drop into both eyes.     . VENTOLIN HFA 108 (90 Base) MCG/ACT inhaler Inhale 1-2 puffs into the lungs every 4 (four) hours as needed for wheezing or shortness of breath.   6   No current facility-administered medications for this visit.     Allergies as of 08/09/2017  . (No Known Allergies)    Vitals: BP 96/62   Pulse 98   Ht 5\' 7"  (1.702 m)   Wt 200 lb (90.7 kg)   BMI 31.32 kg/m  Last Weight:  Wt Readings from Last 1 Encounters:  08/09/17 200 lb (90.7 kg)   Last Height:   Ht Readings from Last 1 Encounters:  08/09/17 5\' 7"  (1.702 m)    Physical exam:  General: The patient is awake, alert and appears not in acute distress. The patient is well groomed. Head: Normocephalic, atraumatic. Neck is supple. Mallampati 1 wide open- very red upper airway( GERD )with a  neck circumference of  20 inches ,  no nasal deviation, but chronic tendency to have nasal airflow obstruction-rhinitis. Tonsillectomy at age 60.  Cardiovascular:  Regular rate and rhythm ,   Neurologic exam : The patient is awake and alert, oriented to place and time.   Cranial nerves: No change in taste or smell. Pupils are equal and briskly reactive to light. Hearing to finger rub intact bilaterally . Facial sensation intact to fine touch.  Facial motor strength is symmetric, tongue and uvula move midline.  Motor exam:  No cogwheeling , no tremor.  he has myoclonic jerks in the right leg- these  seem to be more hip flexion jerks -the hip flexor is affected, this is not a knee extension jerk.   No patella reflex an  Atrophy of the quadriceps, foot drop right  Weakness in the right upper and lower extremities..   There is not much strength dorsal or plantar flexion. .  Left foot is much more strong.   Sensory exam reveals numbness in the fingertips but profound numbness to vibration course pressure in both  feet up to the ankle level.   The bottom of the feet dorsum pedis and toes are numb to these modalities.  There is no significant ankle edema. He has complete incontinence.  Deep tendon reflexes: in the upper and lower extremities are attenuated.   Right patella could not be elicited.   Assessment:  Visit duration 35 minutes -this evaluation was dedicated to determine the origin of his new onset motor or disorder, gait instability with sudden knees buckling, right myoclonic jerks with hip flexion, interestingly the myoclonic jerks picked up after physical exam mild clonus is not present during sleep to his knowledge.  EMG and NCV without evidence of upper and lower motorneuron dysfunction.  Sensory motor neuropathy.  Right more weak than left.  PAINLESS ! h has lower p back pressure, but no pain in his legs.   PLAN MRI brain and neck - cervical - thoracic ?[name] Conus cauda - evaluation in thoracic MRI  For incontinence   Cc Dr. [name]  Cc dr Rhona Raider     Addendum:  16.45 PM [name] has been seen By Dr Normajean [place], MD at [facility]. I asked Alyssa, the medical assistant,  to send me his 07-30-2017 note, which was not provided for me today. She will fax it now. I will try to speak to him tomorrow before we have to do any peer to peer review on his MRI. Larey Seat, MD

## 2017-08-09 NOTE — Telephone Encounter (Signed)
I started the case online for all the MRi's and Ct. I was unable to get it approved on my level. I faxed clinical notes. Dorothy with [employer] called me and informed me due to the combination of all the MRI's together it is requiring a peer to peer. The phone number is [phone]. The member ID is [id number] & DOB is [id number]. The case does close on Friday March 1.

## 2017-08-10 NOTE — Telephone Encounter (Signed)
I await Dr Cipriano Mile notes from [facility] 2-111- visit with the patient.

## 2017-08-13 ENCOUNTER — Encounter: Payer: Self-pay | Admitting: Neurology

## 2017-08-13 ENCOUNTER — Telehealth: Payer: Self-pay | Admitting: Neurology

## 2017-08-13 DIAGNOSIS — R3989 Other symptoms and signs involving the genitourinary system: Secondary | ICD-10-CM

## 2017-08-13 DIAGNOSIS — R399 Unspecified symptoms and signs involving the genitourinary system: Secondary | ICD-10-CM

## 2017-08-13 DIAGNOSIS — R29898 Other symptoms and signs involving the musculoskeletal system: Secondary | ICD-10-CM

## 2017-08-13 DIAGNOSIS — R292 Abnormal reflex: Secondary | ICD-10-CM

## 2017-08-13 DIAGNOSIS — R198 Other specified symptoms and signs involving the digestive system and abdomen: Secondary | ICD-10-CM

## 2017-08-13 NOTE — Telephone Encounter (Signed)
Chart came in for the pt. Placed on Dr Dohmeier's desk

## 2017-08-13 NOTE — Telephone Encounter (Addendum)
Reviewing his outside notes,   pursuing cervical spine /thoraxic spine MRI for now. Not brain.   Dr. Leland Her impression from 30 July 2017 was that this 77 year old gentleman was wheelchair-bound with progressive worsening of lower extremity weakness without associated sensory abnormality he also stated that the patient denied any bowel or bladder dysfunction.  He has also mentions a weakness involving not just the lower but bilateral upper extremities, diffuse weakness, deep tendon reflexes in the lower extremity #0 not elicited.  He reviewed the image of the MRI our spine severe bilateral foraminal stenosis this did not explain his profound muscle weakness.  We will pursue the cervical spine MRI, and as I have found him according to his wife to be bowel and bladder incontinent we have to look at the coronal scout or region.  I also will order an EMG nerve conduction study with a question of possible CIDP following a urinary tract infection.

## 2017-08-13 NOTE — Telephone Encounter (Signed)
I will send this patient to [facility] for a spinal tap, verify if cyto-albuminic dissociation is present, d/c with Dr Darryl Lent may be an axonal form of CIDP?  I ordered cervical and thoracic MRI to review if high level stenosis may be present.  I cancelled the pelvic CT / Will need image of the brain to make sure tap is safe.   Will order him to stop ASA before procedure.

## 2017-08-13 NOTE — Telephone Encounter (Signed)
Review NCV and EMG with question of CIDP.

## 2017-08-13 NOTE — Telephone Encounter (Signed)
I have also called [facility] where Dr Jacelyn Grip is located and LVM with medical records stating we needed recent records for the patient from Dr Jacelyn Grip and Dr Latanya Maudlin. I have also informed our medical records person Hilda Blades so that she may assist [place] in getting these records

## 2017-08-13 NOTE — Addendum Note (Signed)
Addended by: Larey Seat on: 08/13/2017 04:08 PM   Modules accepted: Orders

## 2017-08-14 NOTE — Telephone Encounter (Signed)
Patients wife called back and I was able to go over everything that Dr Brett Fairy was wanting to pursue. The patients wife verbalized understanding and will call GI to get the images all scheduled to complete

## 2017-08-18 ENCOUNTER — Encounter (HOSPITAL_COMMUNITY): Payer: Self-pay

## 2017-08-18 ENCOUNTER — Emergency Department (HOSPITAL_COMMUNITY): Payer: Medicare Other

## 2017-08-18 ENCOUNTER — Other Ambulatory Visit: Payer: Self-pay

## 2017-08-18 ENCOUNTER — Inpatient Hospital Stay (HOSPITAL_COMMUNITY): Payer: Medicare Other

## 2017-08-18 ENCOUNTER — Inpatient Hospital Stay (HOSPITAL_COMMUNITY)
Admission: EM | Admit: 2017-08-18 | Discharge: 2017-08-23 | DRG: 190 | Disposition: A | Discharge status: Skilled Nursing Facility | Payer: Medicare Other | Attending: Family Medicine | Admitting: Family Medicine

## 2017-08-18 DIAGNOSIS — Z8709 Personal history of other diseases of the respiratory system: Secondary | ICD-10-CM | POA: Diagnosis present

## 2017-08-18 DIAGNOSIS — R7881 Bacteremia: Secondary | ICD-10-CM | POA: Diagnosis present

## 2017-08-18 DIAGNOSIS — G7109 Other specified muscular dystrophies: Secondary | ICD-10-CM | POA: Diagnosis present

## 2017-08-18 DIAGNOSIS — G4733 Obstructive sleep apnea (adult) (pediatric): Secondary | ICD-10-CM | POA: Diagnosis present

## 2017-08-18 DIAGNOSIS — Z85850 Personal history of malignant neoplasm of thyroid: Secondary | ICD-10-CM

## 2017-08-18 DIAGNOSIS — E876 Hypokalemia: Secondary | ICD-10-CM | POA: Diagnosis present

## 2017-08-18 DIAGNOSIS — E890 Postprocedural hypothyroidism: Secondary | ICD-10-CM | POA: Diagnosis present

## 2017-08-18 DIAGNOSIS — K219 Gastro-esophageal reflux disease without esophagitis: Secondary | ICD-10-CM | POA: Diagnosis present

## 2017-08-18 DIAGNOSIS — J189 Pneumonia, unspecified organism: Secondary | ICD-10-CM

## 2017-08-18 DIAGNOSIS — Z6833 Body mass index (BMI) 33.0-33.9, adult: Secondary | ICD-10-CM

## 2017-08-18 DIAGNOSIS — Z993 Dependence on wheelchair: Secondary | ICD-10-CM

## 2017-08-18 DIAGNOSIS — Z96652 Presence of left artificial knee joint: Secondary | ICD-10-CM | POA: Diagnosis present

## 2017-08-18 DIAGNOSIS — J9811 Atelectasis: Secondary | ICD-10-CM | POA: Diagnosis present

## 2017-08-18 DIAGNOSIS — I6529 Occlusion and stenosis of unspecified carotid artery: Secondary | ICD-10-CM | POA: Diagnosis present

## 2017-08-18 DIAGNOSIS — R29818 Other symptoms and signs involving the nervous system: Secondary | ICD-10-CM | POA: Diagnosis not present

## 2017-08-18 DIAGNOSIS — I129 Hypertensive chronic kidney disease with stage 1 through stage 4 chronic kidney disease, or unspecified chronic kidney disease: Secondary | ICD-10-CM | POA: Diagnosis present

## 2017-08-18 DIAGNOSIS — I351 Nonrheumatic aortic (valve) insufficiency: Secondary | ICD-10-CM | POA: Diagnosis not present

## 2017-08-18 DIAGNOSIS — E669 Obesity, unspecified: Secondary | ICD-10-CM | POA: Diagnosis present

## 2017-08-18 DIAGNOSIS — D72829 Elevated white blood cell count, unspecified: Secondary | ICD-10-CM

## 2017-08-18 DIAGNOSIS — G2581 Restless legs syndrome: Secondary | ICD-10-CM | POA: Diagnosis present

## 2017-08-18 DIAGNOSIS — J441 Chronic obstructive pulmonary disease with (acute) exacerbation: Secondary | ICD-10-CM | POA: Diagnosis not present

## 2017-08-18 DIAGNOSIS — I2510 Atherosclerotic heart disease of native coronary artery without angina pectoris: Secondary | ICD-10-CM | POA: Diagnosis present

## 2017-08-18 DIAGNOSIS — G253 Myoclonus: Secondary | ICD-10-CM | POA: Diagnosis not present

## 2017-08-18 DIAGNOSIS — Z79891 Long term (current) use of opiate analgesic: Secondary | ICD-10-CM

## 2017-08-18 DIAGNOSIS — M1000 Idiopathic gout, unspecified site: Secondary | ICD-10-CM | POA: Diagnosis present

## 2017-08-18 DIAGNOSIS — G20 Parkinson's disease: Secondary | ICD-10-CM | POA: Diagnosis present

## 2017-08-18 DIAGNOSIS — Z7984 Long term (current) use of oral hypoglycemic drugs: Secondary | ICD-10-CM

## 2017-08-18 DIAGNOSIS — R159 Full incontinence of feces: Secondary | ICD-10-CM | POA: Diagnosis not present

## 2017-08-18 DIAGNOSIS — Z79899 Other long term (current) drug therapy: Secondary | ICD-10-CM

## 2017-08-18 DIAGNOSIS — G4730 Sleep apnea, unspecified: Secondary | ICD-10-CM | POA: Diagnosis not present

## 2017-08-18 DIAGNOSIS — B9562 Methicillin resistant Staphylococcus aureus infection as the cause of diseases classified elsewhere: Secondary | ICD-10-CM | POA: Diagnosis present

## 2017-08-18 DIAGNOSIS — Y95 Nosocomial condition: Secondary | ICD-10-CM | POA: Diagnosis present

## 2017-08-18 DIAGNOSIS — M6258 Muscle wasting and atrophy, not elsewhere classified, other site: Secondary | ICD-10-CM | POA: Diagnosis present

## 2017-08-18 DIAGNOSIS — I712 Thoracic aortic aneurysm, without rupture: Secondary | ICD-10-CM | POA: Diagnosis present

## 2017-08-18 DIAGNOSIS — J90 Pleural effusion, not elsewhere classified: Secondary | ICD-10-CM | POA: Diagnosis present

## 2017-08-18 DIAGNOSIS — Z9989 Dependence on other enabling machines and devices: Secondary | ICD-10-CM

## 2017-08-18 DIAGNOSIS — Z66 Do not resuscitate: Secondary | ICD-10-CM | POA: Diagnosis present

## 2017-08-18 DIAGNOSIS — I452 Bifascicular block: Secondary | ICD-10-CM | POA: Diagnosis present

## 2017-08-18 DIAGNOSIS — Z9981 Dependence on supplemental oxygen: Secondary | ICD-10-CM

## 2017-08-18 DIAGNOSIS — G629 Polyneuropathy, unspecified: Secondary | ICD-10-CM | POA: Diagnosis present

## 2017-08-18 DIAGNOSIS — J9601 Acute respiratory failure with hypoxia: Secondary | ICD-10-CM | POA: Diagnosis present

## 2017-08-18 DIAGNOSIS — R32 Unspecified urinary incontinence: Secondary | ICD-10-CM | POA: Diagnosis not present

## 2017-08-18 DIAGNOSIS — E785 Hyperlipidemia, unspecified: Secondary | ICD-10-CM | POA: Diagnosis present

## 2017-08-18 DIAGNOSIS — E1122 Type 2 diabetes mellitus with diabetic chronic kidney disease: Secondary | ICD-10-CM | POA: Diagnosis present

## 2017-08-18 DIAGNOSIS — R0600 Dyspnea, unspecified: Secondary | ICD-10-CM | POA: Diagnosis present

## 2017-08-18 DIAGNOSIS — Z87891 Personal history of nicotine dependence: Secondary | ICD-10-CM

## 2017-08-18 DIAGNOSIS — N189 Chronic kidney disease, unspecified: Secondary | ICD-10-CM | POA: Diagnosis present

## 2017-08-18 DIAGNOSIS — Z7951 Long term (current) use of inhaled steroids: Secondary | ICD-10-CM

## 2017-08-18 DIAGNOSIS — Z8711 Personal history of peptic ulcer disease: Secondary | ICD-10-CM

## 2017-08-18 DIAGNOSIS — J440 Chronic obstructive pulmonary disease with acute lower respiratory infection: Secondary | ICD-10-CM | POA: Diagnosis present

## 2017-08-18 DIAGNOSIS — M6281 Muscle weakness (generalized): Secondary | ICD-10-CM | POA: Diagnosis not present

## 2017-08-18 DIAGNOSIS — K2270 Barrett's esophagus without dysplasia: Secondary | ICD-10-CM | POA: Diagnosis present

## 2017-08-18 LAB — INFLUENZA PANEL BY PCR (TYPE A & B)
INFLAPCR: NEGATIVE
INFLBPCR: NEGATIVE

## 2017-08-18 LAB — CBC WITH DIFFERENTIAL/PLATELET
BASOS ABS: 0 10*3/uL (ref 0.0–0.1)
BASOS PCT: 0 %
EOS ABS: 0.2 10*3/uL (ref 0.0–0.7)
Eosinophils Relative: 1 %
HCT: 41 % (ref 39.0–52.0)
HEMOGLOBIN: 13.9 g/dL (ref 13.0–17.0)
LYMPHS ABS: 1.4 10*3/uL (ref 0.7–4.0)
Lymphocytes Relative: 11 %
MCH: 30 pg (ref 26.0–34.0)
MCHC: 33.9 g/dL (ref 30.0–36.0)
MCV: 88.4 fL (ref 78.0–100.0)
Monocytes Absolute: 0.8 10*3/uL (ref 0.1–1.0)
Monocytes Relative: 6 %
NEUTROS PCT: 82 %
Neutro Abs: 10.7 10*3/uL — ABNORMAL HIGH (ref 1.7–7.7)
Platelets: 301 10*3/uL (ref 150–400)
RBC: 4.64 MIL/uL (ref 4.22–5.81)
RDW: 14.9 % (ref 11.5–15.5)
WBC: 13.1 10*3/uL — AB (ref 4.0–10.5)

## 2017-08-18 LAB — COMPREHENSIVE METABOLIC PANEL
ALT: 20 U/L (ref 17–63)
ANION GAP: 16 — AB (ref 5–15)
AST: 28 U/L (ref 15–41)
Albumin: 3.2 g/dL — ABNORMAL LOW (ref 3.5–5.0)
Alkaline Phosphatase: 67 U/L (ref 38–126)
BILIRUBIN TOTAL: 0.6 mg/dL (ref 0.3–1.2)
BUN: 32 mg/dL — ABNORMAL HIGH (ref 6–20)
CO2: 26 mmol/L (ref 22–32)
Calcium: 9 mg/dL (ref 8.9–10.3)
Chloride: 101 mmol/L (ref 101–111)
Creatinine, Ser: 0.98 mg/dL (ref 0.61–1.24)
GFR calc Af Amer: >60 mL/min (ref >60)
GFR calc non Af Amer: >60 mL/min (ref >60)
GLUCOSE: 112 mg/dL — AB (ref 65–99)
POTASSIUM: 3 mmol/L — AB (ref 3.5–5.1)
SODIUM: 143 mmol/L (ref 135–145)
TOTAL PROTEIN: 6.7 g/dL (ref 6.5–8.1)

## 2017-08-18 LAB — TROPONIN I: Troponin I: <0.03 ng/mL (ref <0.03)

## 2017-08-18 LAB — CBG MONITORING, ED: GLUCOSE-CAPILLARY: 93 mg/dL (ref 65–99)

## 2017-08-18 LAB — BRAIN NATRIURETIC PEPTIDE: B Natriuretic Peptide: 68.9 pg/mL (ref 0.0–100.0)

## 2017-08-18 IMAGING — CT CT ANGIO CHEST
2 of 6 series · 18 of 46 positions shown · IV contrast (ISOVUE)
Comparison: CT scan of [DATE].

CLINICAL DATA: Shortness of breath.

EXAM:
CT ANGIOGRAPHY CHEST WITH CONTRAST
TECHNIQUE: Multidetector CT imaging of the chest was performed using the
standard protocol during bolus administration of intravenous
contrast. Multiplanar CT image reconstructions and MIPs were
obtained to evaluate the vascular anatomy.
CONTRAST:  80 mL [A4] IOPAMIDOL ([A4]) INJECTION 76%

[Series 5: thins · axial · 0.82mm/px · z∈[+1239,+1519]mm · 15 of 308 slices shown]
[im 14/308  lung]
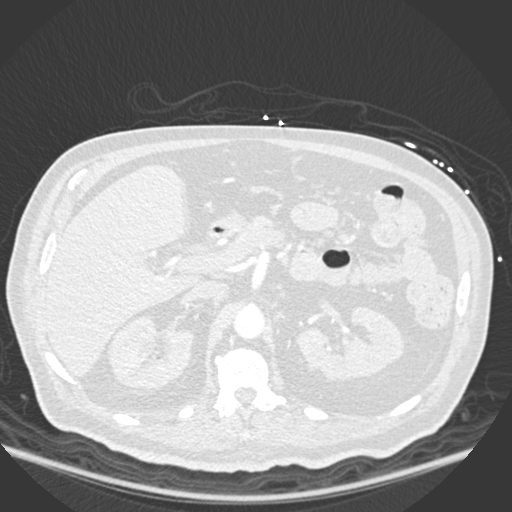
[im 41/308  soft-tissue]
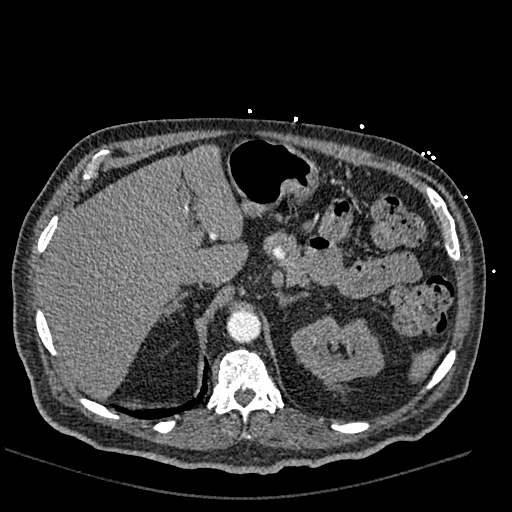
[im 54/308  lung]
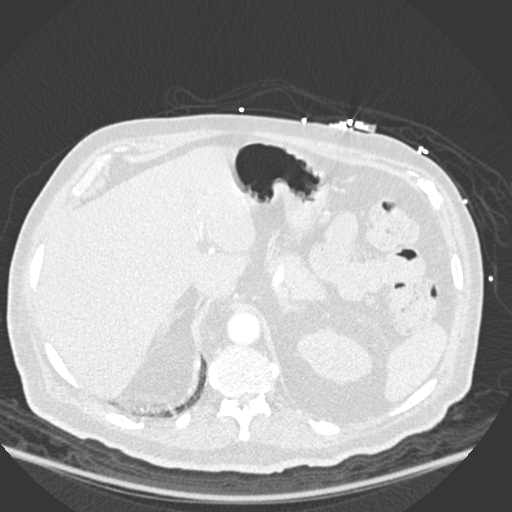
[im 81/308  soft-tissue]
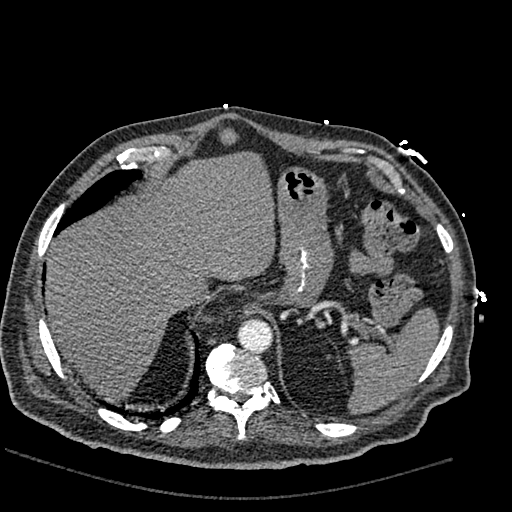
[im 94/308  lung]
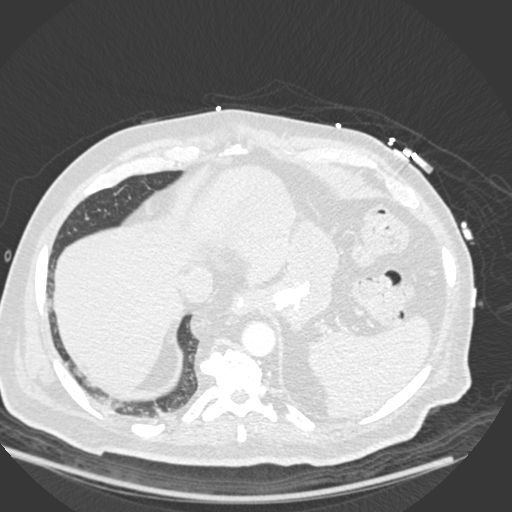
[im 121/308  soft-tissue]
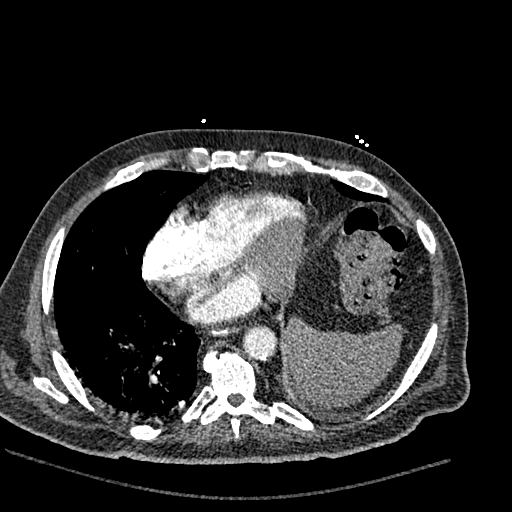
[im 134/308  lung]
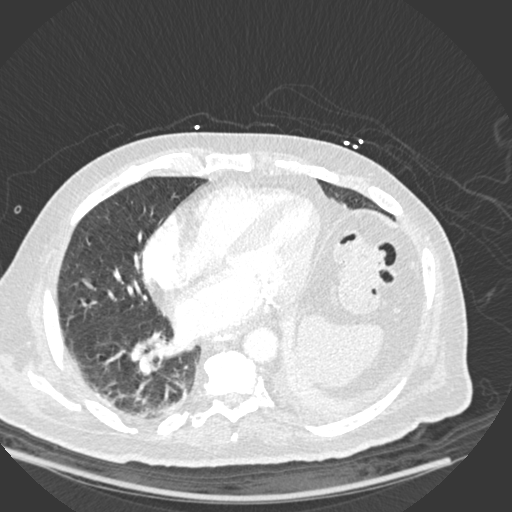
[im 161/308  soft-tissue]
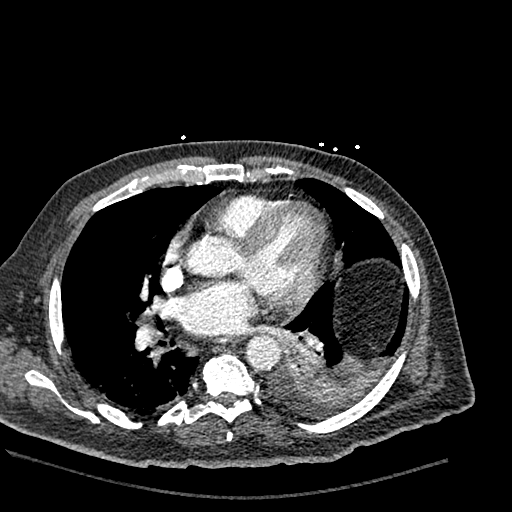
[im 174/308  lung]
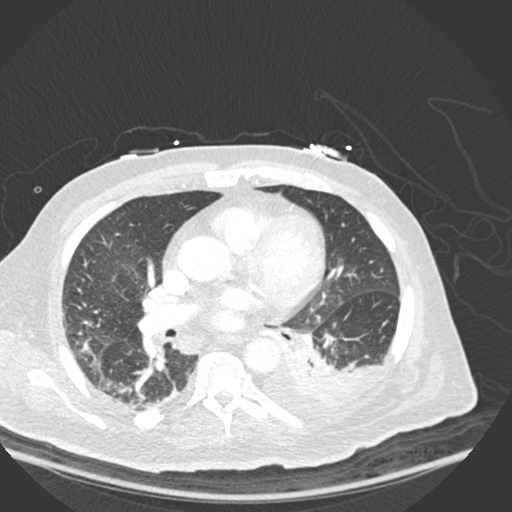
[im 187/308  soft-tissue]
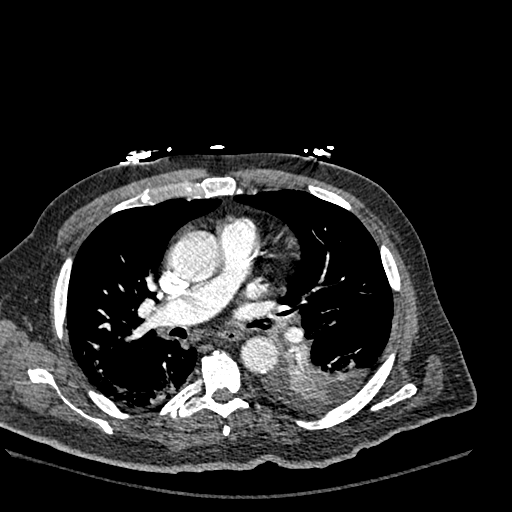
[im 214/308  lung]
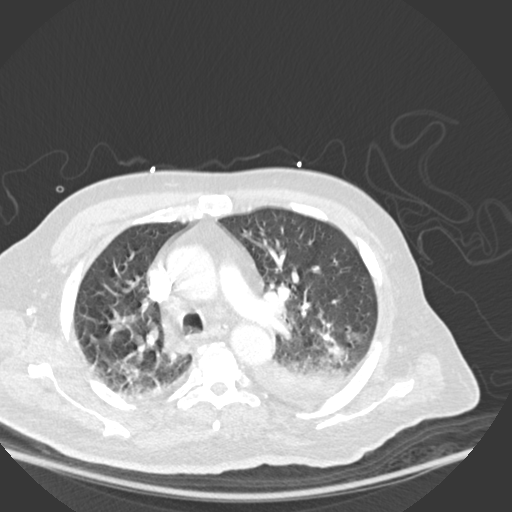
[im 227/308  soft-tissue]
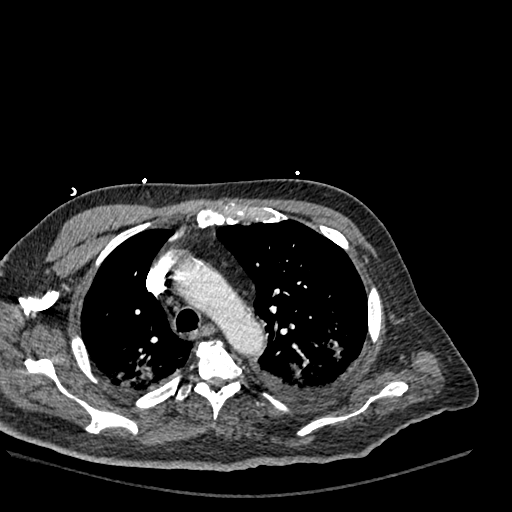
[im 254/308  lung]
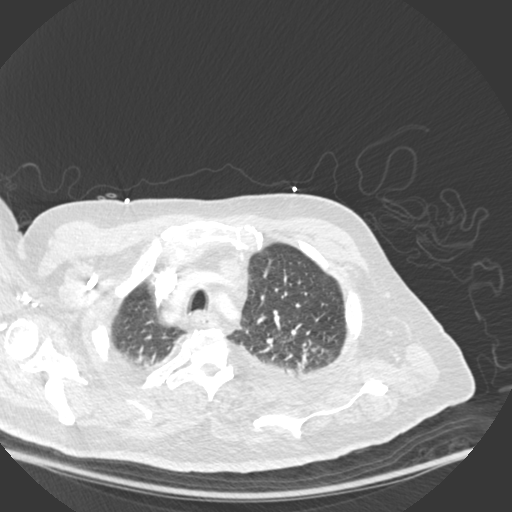
[im 267/308  soft-tissue]
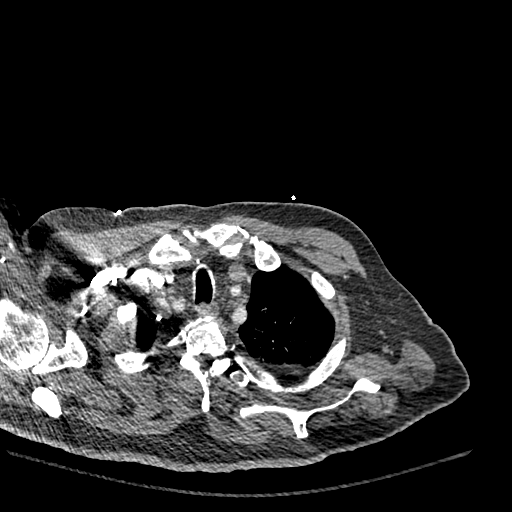
[im 294/308  lung]
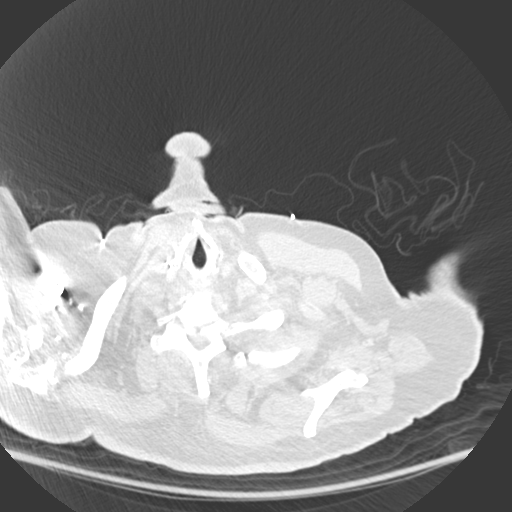

[Series 7: coronal mpr · coronal · 0.57mm/px · 3 of 151 slices shown]
[im 38/151  soft-tissue]
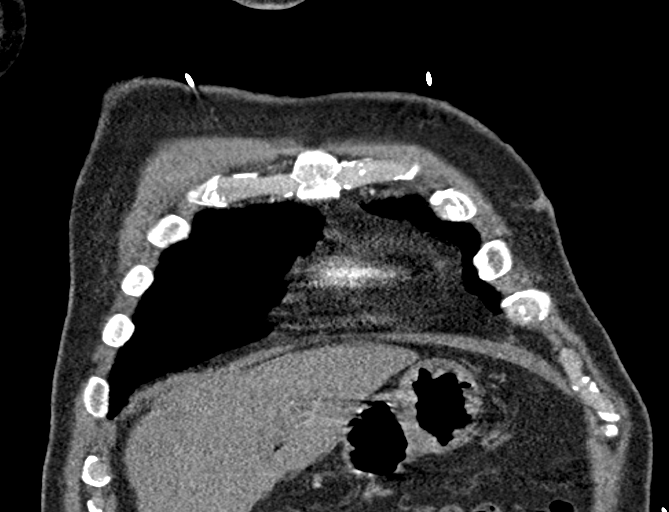
[im 76/151  soft-tissue]
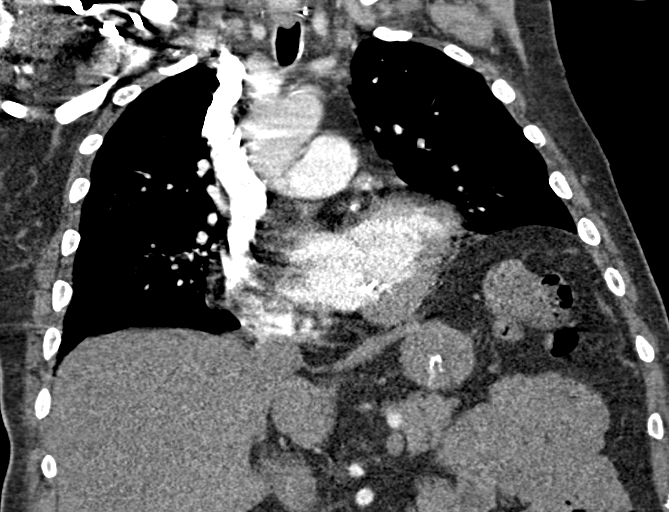
[im 113/151  soft-tissue]
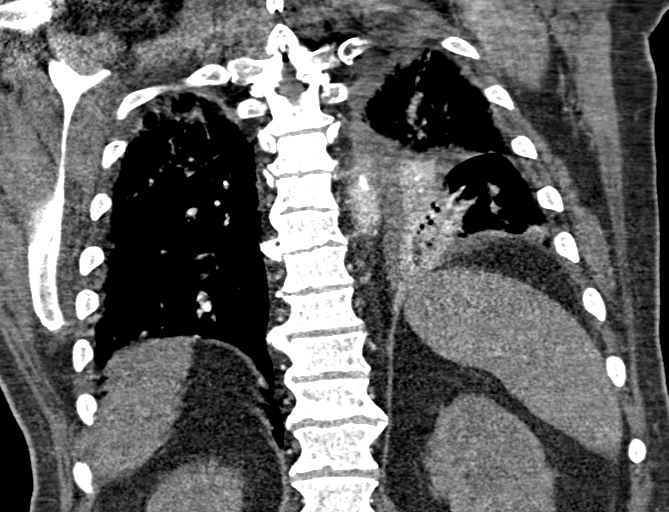

[18 of 46 positions shown; findings below may reference images not displayed]

FINDINGS: Cardiovascular: Satisfactory opacification of the pulmonary arteries
to the segmental level. No evidence of pulmonary embolism. Normal
heart size. No pericardial effusion. Atherosclerosis of thoracic
aorta is noted without dissection. 4.2 cm ascending thoracic aortic
aneurysm is noted.

Mediastinum/Nodes: Status post thyroidectomy. Esophagus is
unremarkable. No mediastinal adenopathy is noted.

Lungs/Pleura: No pneumothorax is noted. Mild left pleural effusion
is noted with adjacent left lower lobe atelectasis. Bilateral upper
lobe opacities are noted most consistent with subsegmental
atelectasis.

Upper Abdomen: No acute abnormality.

Musculoskeletal: No chest wall abnormality. No acute or significant
osseous findings.

Review of the MIP images confirms the above findings.
IMPRESSION: No definite evidence of pulmonary embolus.

4.2 cm ascending thoracic aortic aneurysm. Recommend annual imaging
followup by CTA or MRA. This recommendation follows [A4]
ACCF/AHA/AATS/ACR/ASA/SCA/GASI/GASI/GASI/GASI Guidelines for the
Diagnosis and Management of Patients with Thoracic Aortic Disease.
Circulation. [A4]; 121: [id number].

Mild left pleural effusion with adjacent left lower lobe
atelectasis. Bilateral upper lobe subsegmental atelectasis is noted
as well.

Aortic Atherosclerosis ([A4]-[A4]).

## 2017-08-18 IMAGING — CR DG CHEST 2V
2 series · 2 of 2 positions shown · non-contrast
Comparison: [DATE]

CLINICAL DATA: Cough, congestion, shortness of breath

EXAM:
CHEST  2 VIEW

[w chest lat]
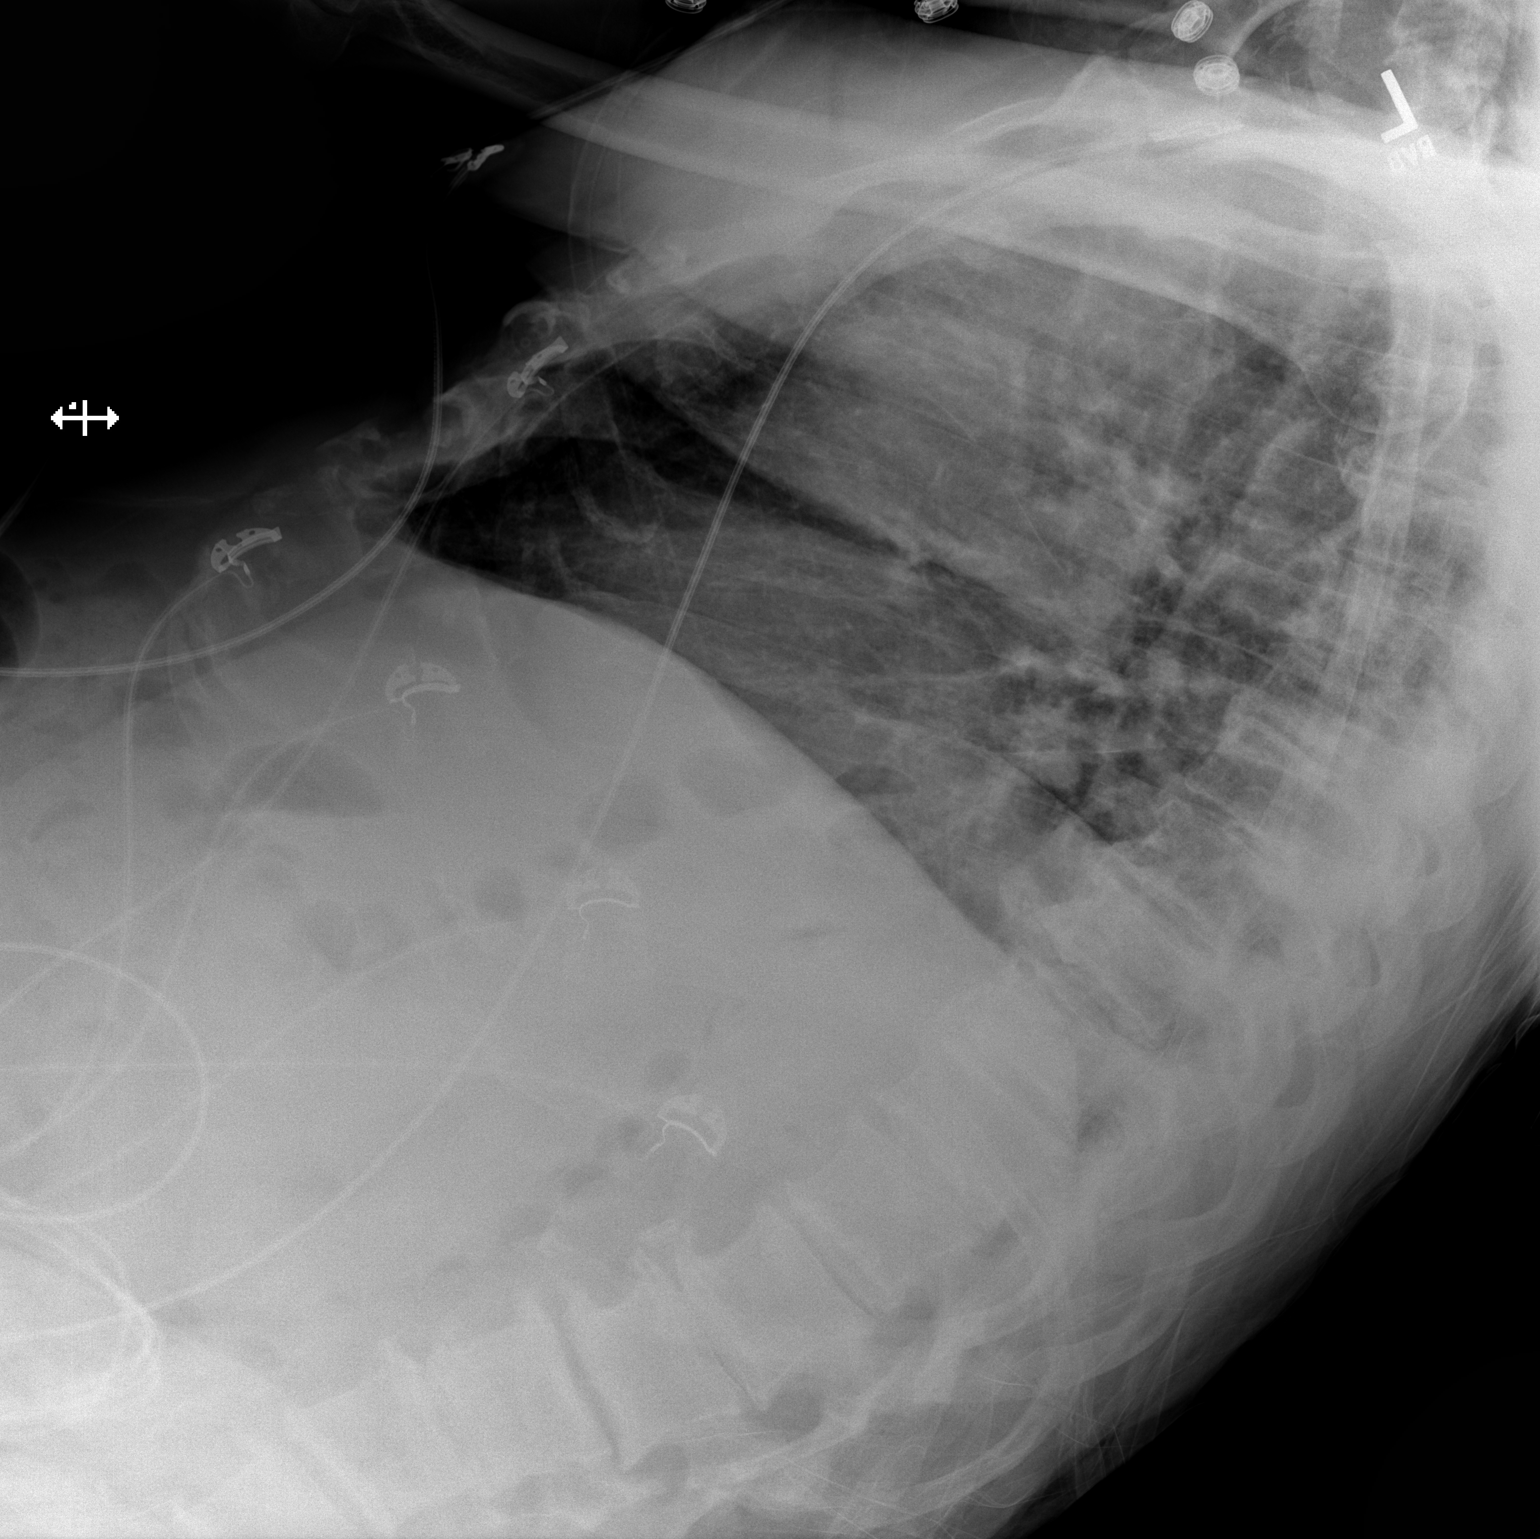

[x chest ap]
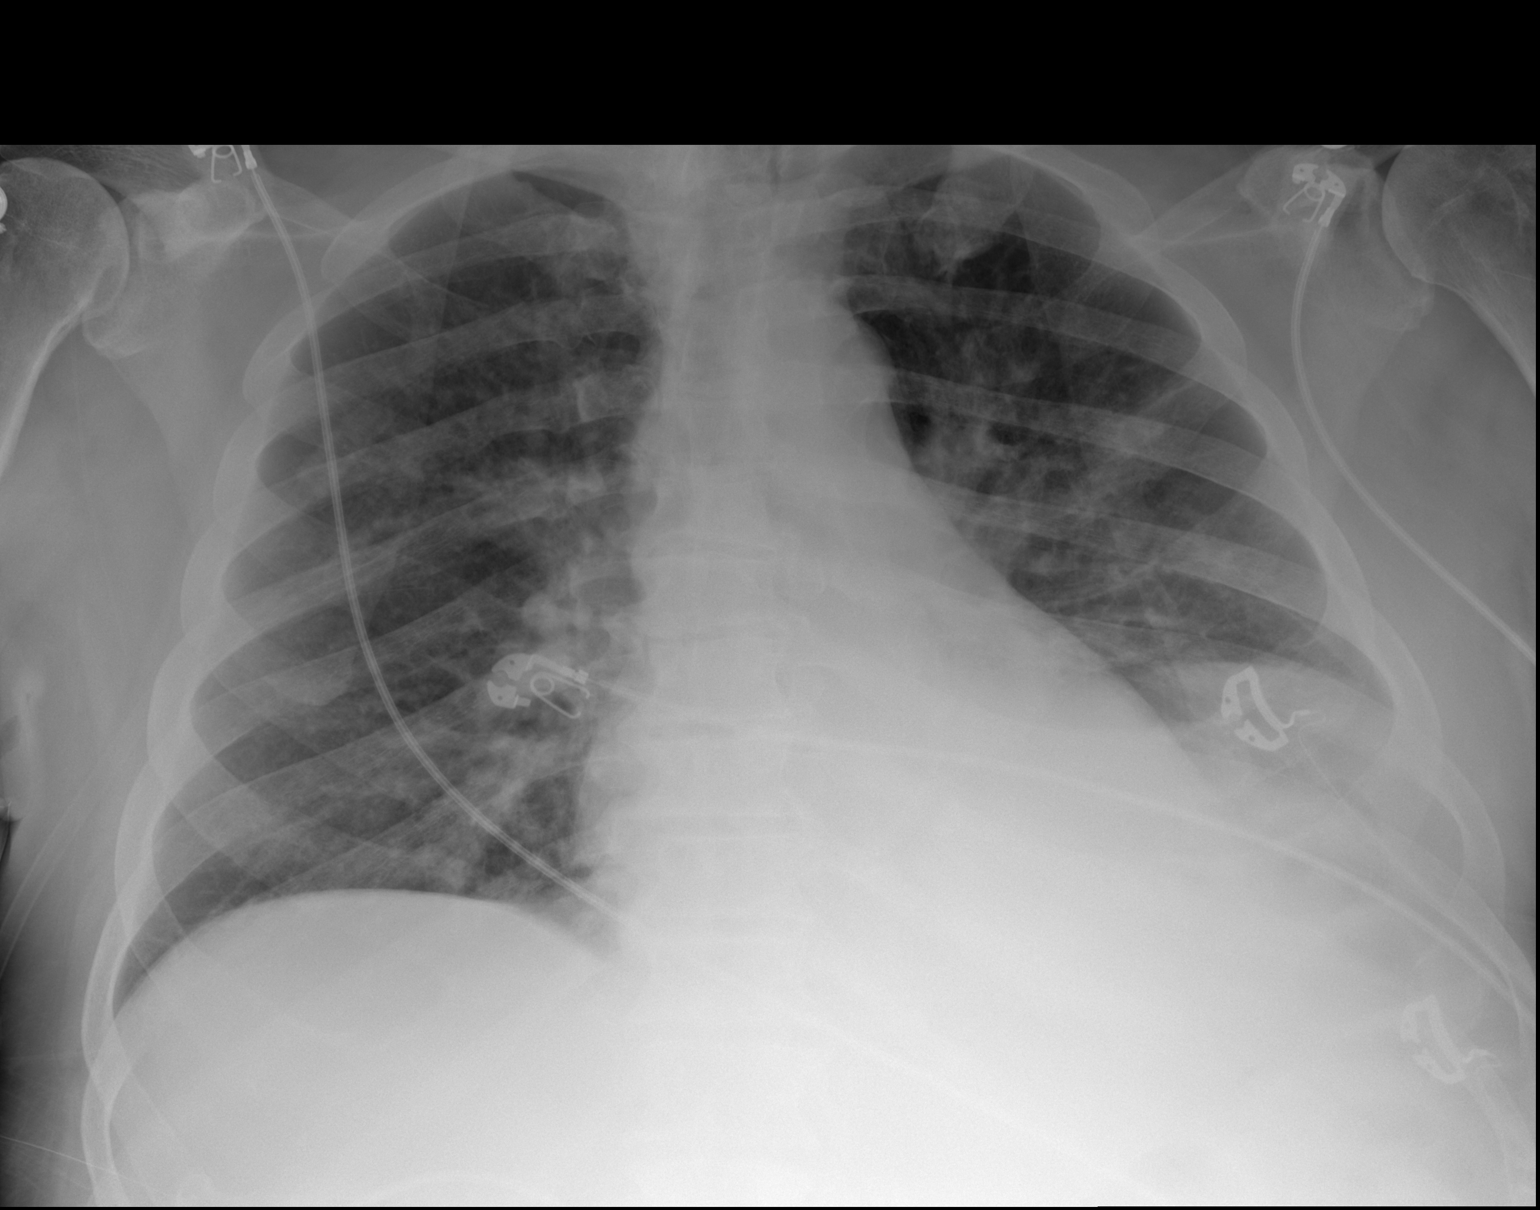

[2 of 2 positions shown; findings below may reference images not displayed]

FINDINGS: Bilateral diffuse interstitial thickening and peribronchial cuffing
most concerning for bronchitis. There is no pleural effusion or
pneumothorax. The heart and mediastinal contours are unremarkable.

The osseous structures are unremarkable.
IMPRESSION: Bilateral diffuse interstitial thickening and peribronchial cuffing
most concerning for bronchitis.

## 2017-08-18 MED ORDER — UMECLIDINIUM BROMIDE 62.5 MCG/INH IN AEPB
1.0000 | INHALATION_SPRAY | Freq: Every day | RESPIRATORY_TRACT | Status: DC
Start: 2017-08-19 — End: 2017-08-23
  Administered 2017-08-19 – 2017-08-23 (×5): 1 via RESPIRATORY_TRACT
  Filled 2017-08-18: qty 7

## 2017-08-18 MED ORDER — COLCHICINE 0.6 MG PO TABS
0.6000 mg | ORAL_TABLET | Freq: Every day | ORAL | Status: DC
  Administered 2017-08-19 – 2017-08-23 (×5): 0.6 mg via ORAL
  Filled 2017-08-18 (×5): qty 1

## 2017-08-18 MED ORDER — SODIUM CHLORIDE 0.9 % IV SOLN
1250.0000 mg | INTRAVENOUS | Status: DC
  Filled 2017-08-18: qty 1250

## 2017-08-18 MED ORDER — OXYCODONE HCL 5 MG PO TABS: 5.0000 mg | ORAL_TABLET | ORAL | Status: DC | PRN

## 2017-08-18 MED ORDER — ROPINIROLE HCL 1 MG PO TABS
1.0000 mg | ORAL_TABLET | Freq: Every day | ORAL | Status: DC
  Administered 2017-08-19 – 2017-08-23 (×5): 1 mg via ORAL
  Filled 2017-08-18 (×5): qty 1

## 2017-08-18 MED ORDER — HYDROCHLOROTHIAZIDE 25 MG PO TABS
25.0000 mg | ORAL_TABLET | Freq: Every day | ORAL | Status: DC
  Administered 2017-08-19 – 2017-08-23 (×5): 25 mg via ORAL
  Filled 2017-08-18 (×5): qty 1

## 2017-08-18 MED ORDER — SODIUM CHLORIDE 0.9 % IJ SOLN
INTRAMUSCULAR | Status: AC
  Filled 2017-08-18: qty 50

## 2017-08-18 MED ORDER — LATANOPROST 0.005 % OP SOLN
1.0000 [drp] | Freq: Every day | OPHTHALMIC | Status: DC
  Administered 2017-08-18 – 2017-08-22 (×5): 1 [drp] via OPHTHALMIC
  Filled 2017-08-18 (×2): qty 2.5

## 2017-08-18 MED ORDER — SODIUM CHLORIDE 0.9 % IV SOLN
1.0000 g | Freq: Three times a day (TID) | INTRAVENOUS | Status: DC
  Administered 2017-08-18 – 2017-08-19 (×2): 1 g via INTRAVENOUS
  Filled 2017-08-18 (×3): qty 1

## 2017-08-18 MED ORDER — POTASSIUM CHLORIDE CRYS ER 20 MEQ PO TBCR
40.0000 meq | EXTENDED_RELEASE_TABLET | Freq: Once | ORAL | Status: AC
  Administered 2017-08-18: 40 meq via ORAL
  Filled 2017-08-18: qty 2

## 2017-08-18 MED ORDER — PANTOPRAZOLE SODIUM 40 MG PO TBEC
40.0000 mg | DELAYED_RELEASE_TABLET | Freq: Every evening | ORAL | Status: DC
  Administered 2017-08-18 – 2017-08-22 (×5): 40 mg via ORAL
  Filled 2017-08-18 (×5): qty 1

## 2017-08-18 MED ORDER — SODIUM CHLORIDE 0.9 % IV SOLN
2.0000 g | Freq: Once | INTRAVENOUS | Status: AC
  Administered 2017-08-18: 2 g via INTRAVENOUS
  Filled 2017-08-18: qty 2

## 2017-08-18 MED ORDER — ONDANSETRON HCL 4 MG PO TABS: 4.0000 mg | ORAL_TABLET | Freq: Four times a day (QID) | ORAL | Status: DC | PRN

## 2017-08-18 MED ORDER — ONDANSETRON HCL 4 MG/2ML IJ SOLN: 4.0000 mg | Freq: Four times a day (QID) | INTRAMUSCULAR | Status: DC | PRN

## 2017-08-18 MED ORDER — ALBUTEROL SULFATE (2.5 MG/3ML) 0.083% IN NEBU: 3.0000 mL | INHALATION_SOLUTION | RESPIRATORY_TRACT | Status: DC | PRN

## 2017-08-18 MED ORDER — ASPIRIN 325 MG PO TABS: 325.0000 mg | ORAL_TABLET | Freq: Every day | ORAL | Status: DC

## 2017-08-18 MED ORDER — POTASSIUM CHLORIDE CRYS ER 20 MEQ PO TBCR: 20.0000 meq | EXTENDED_RELEASE_TABLET | Freq: Every day | ORAL | Status: DC

## 2017-08-18 MED ORDER — CLONIDINE HCL 0.1 MG PO TABS
0.1000 mg | ORAL_TABLET | Freq: Two times a day (BID) | ORAL | Status: DC
  Administered 2017-08-18 – 2017-08-23 (×10): 0.1 mg via ORAL
  Filled 2017-08-18 (×10): qty 1

## 2017-08-18 MED ORDER — CALCIUM CARBONATE-VITAMIN D 500-200 MG-UNIT PO TABS
1.0000 | ORAL_TABLET | Freq: Every day | ORAL | Status: DC
  Filled 2017-08-18: qty 1

## 2017-08-18 MED ORDER — UMECLIDINIUM-VILANTEROL 62.5-25 MCG/INH IN AEPB: 1.0000 | INHALATION_SPRAY | Freq: Every day | RESPIRATORY_TRACT | Status: DC

## 2017-08-18 MED ORDER — ACETAMINOPHEN 325 MG PO TABS: 650.0000 mg | ORAL_TABLET | Freq: Four times a day (QID) | ORAL | Status: DC | PRN

## 2017-08-18 MED ORDER — CARBIDOPA-LEVODOPA 10-100 MG PO TABS
1.0000 | ORAL_TABLET | ORAL | Status: DC
  Administered 2017-08-18 – 2017-08-23 (×14): 1 via ORAL
  Filled 2017-08-18 (×18): qty 1

## 2017-08-18 MED ORDER — OMEGA-3-ACID ETHYL ESTERS 1 G PO CAPS
3.0000 g | ORAL_CAPSULE | Freq: Every day | ORAL | Status: DC
  Administered 2017-08-19 – 2017-08-23 (×5): 3 g via ORAL
  Filled 2017-08-18 (×5): qty 3

## 2017-08-18 MED ORDER — VANCOMYCIN HCL 10 G IV SOLR
1500.0000 mg | Freq: Once | INTRAVENOUS | Status: AC
  Administered 2017-08-18: 1500 mg via INTRAVENOUS
  Filled 2017-08-18: qty 1500

## 2017-08-18 MED ORDER — ACETAMINOPHEN 650 MG RE SUPP: 650.0000 mg | Freq: Four times a day (QID) | RECTAL | Status: DC | PRN

## 2017-08-18 MED ORDER — VITAMIN D3 25 MCG (1000 UNIT) PO TABS
1000.0000 [IU] | ORAL_TABLET | Freq: Every day | ORAL | Status: DC
  Administered 2017-08-19 – 2017-08-23 (×5): 1000 [IU] via ORAL
  Filled 2017-08-18 (×7): qty 1

## 2017-08-18 MED ORDER — LOSARTAN POTASSIUM-HCTZ 100-25 MG PO TABS: 1.0000 | ORAL_TABLET | Freq: Every day | ORAL | Status: DC

## 2017-08-18 MED ORDER — POLYETHYLENE GLYCOL 3350 17 G PO PACK: 17.0000 g | PACK | Freq: Every day | ORAL | Status: DC | PRN

## 2017-08-18 MED ORDER — ROPINIROLE HCL 1 MG PO TABS
4.0000 mg | ORAL_TABLET | Freq: Every day | ORAL | Status: DC
  Administered 2017-08-18 – 2017-08-22 (×5): 4 mg via ORAL
  Filled 2017-08-18 (×5): qty 4

## 2017-08-18 MED ORDER — ALBUTEROL SULFATE (2.5 MG/3ML) 0.083% IN NEBU
2.5000 mg | INHALATION_SOLUTION | Freq: Once | RESPIRATORY_TRACT | Status: AC
  Administered 2017-08-18: 2.5 mg via RESPIRATORY_TRACT
  Filled 2017-08-18: qty 3

## 2017-08-18 MED ORDER — LEVOTHYROXINE SODIUM 100 MCG PO TABS
200.0000 ug | ORAL_TABLET | Freq: Every day | ORAL | Status: DC
  Administered 2017-08-19 – 2017-08-23 (×5): 200 ug via ORAL
  Filled 2017-08-18: qty 1
  Filled 2017-08-18 (×2): qty 2
  Filled 2017-08-18: qty 1
  Filled 2017-08-18 (×3): qty 2

## 2017-08-18 MED ORDER — SODIUM CHLORIDE 0.9 % IV SOLN: INTRAVENOUS | Status: DC

## 2017-08-18 MED ORDER — IOPAMIDOL (ISOVUE-370) INJECTION 76%
INTRAVENOUS | Status: AC
  Administered 2017-08-18: 100 mL
  Filled 2017-08-18: qty 100

## 2017-08-18 MED ORDER — ENOXAPARIN SODIUM 40 MG/0.4ML ~~LOC~~ SOLN
40.0000 mg | SUBCUTANEOUS | Status: DC
  Administered 2017-08-18 – 2017-08-20 (×3): 40 mg via SUBCUTANEOUS
  Filled 2017-08-18 (×5): qty 0.4

## 2017-08-18 MED ORDER — FLUTICASONE FUROATE-VILANTEROL 100-25 MCG/INH IN AEPB
1.0000 | INHALATION_SPRAY | Freq: Every day | RESPIRATORY_TRACT | Status: DC
  Administered 2017-08-19 – 2017-08-23 (×5): 1 via RESPIRATORY_TRACT
  Filled 2017-08-18: qty 28

## 2017-08-18 MED ORDER — HYDROCODONE-ACETAMINOPHEN 5-325 MG PO TABS: 1.0000 | ORAL_TABLET | Freq: Three times a day (TID) | ORAL | Status: DC | PRN

## 2017-08-18 MED ORDER — ALLOPURINOL 300 MG PO TABS
300.0000 mg | ORAL_TABLET | Freq: Every day | ORAL | Status: DC
  Administered 2017-08-19 – 2017-08-23 (×5): 300 mg via ORAL
  Filled 2017-08-18 (×5): qty 1

## 2017-08-18 MED ORDER — POTASSIUM CHLORIDE IN NACL 40-0.9 MEQ/L-% IV SOLN
INTRAVENOUS | Status: DC
  Administered 2017-08-18 – 2017-08-19 (×2): 125 mL/h via INTRAVENOUS
  Filled 2017-08-18 (×3): qty 1000

## 2017-08-18 MED ORDER — GUAIFENESIN 200 MG PO TABS
400.0000 mg | ORAL_TABLET | Freq: Two times a day (BID) | ORAL | Status: DC | PRN
  Filled 2017-08-18: qty 2

## 2017-08-18 MED ORDER — METOPROLOL TARTRATE 50 MG PO TABS
50.0000 mg | ORAL_TABLET | Freq: Two times a day (BID) | ORAL | Status: DC
  Administered 2017-08-18 – 2017-08-23 (×10): 50 mg via ORAL
  Filled 2017-08-18: qty 2
  Filled 2017-08-18 (×5): qty 1
  Filled 2017-08-18 (×2): qty 2
  Filled 2017-08-18: qty 1
  Filled 2017-08-18: qty 2

## 2017-08-18 MED ORDER — LOSARTAN POTASSIUM 50 MG PO TABS
100.0000 mg | ORAL_TABLET | Freq: Every day | ORAL | Status: DC
  Administered 2017-08-19 – 2017-08-23 (×5): 100 mg via ORAL
  Filled 2017-08-18 (×5): qty 2

## 2017-08-18 MED ORDER — SIMVASTATIN 10 MG PO TABS
10.0000 mg | ORAL_TABLET | Freq: Every day | ORAL | Status: DC
  Administered 2017-08-18 – 2017-08-22 (×5): 10 mg via ORAL
  Filled 2017-08-18 (×5): qty 1

## 2017-08-18 MED ORDER — HYDROCODONE-ACETAMINOPHEN 5-325 MG PO TABS
1.0000 | ORAL_TABLET | Freq: Three times a day (TID) | ORAL | Status: DC | PRN
  Administered 2017-08-20 – 2017-08-22 (×4): 1 via ORAL
  Filled 2017-08-18 (×4): qty 1

## 2017-08-18 NOTE — Progress Notes (Signed)
Droplet prec d/c- flu test negative

## 2017-08-18 NOTE — H&P (Signed)
History and Physical    [name] NID:[id number] DOB: 1940/12/04 DOA: 08/18/2017  PCP: [name], MD   Neuriology: Dr. Brett Fairy  Patient coming from: Home I have personally briefly reviewed patient's old medical records in [place]  Chief Complaint: Shortness of breath since yesterday  HPI: [name] is a 77 y.o. 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  He has been having physical therapy come to his house and the therapist yesterday noted that his oxygen saturation was 89%.  The doctor's office was informed and a home health nurse was to come see them.  The nurse never did come in this morning the patient has had increasing shortness of breath and developed difficulty breathing so he came to the emergency department.  This morning his symptoms were so severe that he was trying to use his CPAP tubing oxygen to help him breathe.  He has a chronic cough that is no worse than usual.  He said he wheezes all the time.  He denies any chest pain or tightness.  He has had no fevers, sore throat, rhinorrhea, he has no sputum production.  He denies any leg swelling.  He was put on oxygen by EMS and states he does not feel any better with the oxygen at this point.  The patient was evaluated in the emergency department and found to be hypoxic and tachypneic.  Chest x-ray showed bilateral diffuse interstitial thickening and peribronchial cuffing concerning for bronchitis.  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  Patient's clinical findings consistent with pneumonia as he has fever, new hypoxemia, leukocytosis and an abnormal chest x-ray.  Given these findings will be admitting the patient into the hospital.   Review of Systems: As per HPI otherwise complete review of systems negative.    Past Medical History:  Diagnosis Date  . Barrett esophagus   . CAD (coronary artery disease)   . Cancer ([place])    thyroid  . Carotid artery occlusion   . Chronic kidney disease   . COPD (chronic obstructive pulmonary disease) ([place])   . Diabetes mellitus without complication ([place])   . GERD (gastroesophageal reflux disease)   . History of thyroid cancer 2008  . History of thyroid cancer   . Hypertension   . OSA (obstructive sleep apnea)   . Restless leg   . Sleep apnea with use of continuous positive airway pressure (CPAP)    2011 [facility] , [facility]  77cn central and obstrcutive. 16 cm water , 3 cm EPR.   . Thyroid disease   . Ulcer    Peptic ulcer disease    Past Surgical History:  Procedure Laterality Date  . CAROTID ENDARTERECTOMY Left January 03, 2006   Dr. Amedeo Plenty  . JOINT REPLACEMENT Left    knee X 2  . THYROIDECTOMY  2008     reports that he quit smoking about 12 years ago. His smoking use included cigars and cigarettes. He quit after 40.00 years of use. he has never used smokeless tobacco. He reports that he does not drink alcohol  or use drugs.  No Known Allergies  Family History  Problem Relation Age of Onset  . COPD Mother   . Hyperlipidemia Mother   . Hypertension Mother   . Diabetes Father   . Kidney disease Father        ESRD  . Hypertension Father   . Cancer Father   . Hyperlipidemia Father     Prior to Admission medications   Medication Sig Start Date End Date Taking? Authorizing Provider  allopurinol (ZYLOPRIM) 300 MG tablet Take 300 mg by mouth daily.  06/02/15  Yes [provider]  ANORO ELLIPTA 62.5-25 MCG/INH AEPB Inhale 1 puff into the  lungs daily. 02/06/17  Yes McQuaid, Ronie Spies, MD  ARNUITY ELLIPTA 100 MCG/ACT AEPB Inhale 1 puff into the lungs daily. 02/06/17  Yes Juanito Doom, MD  calcium citrate-vitamin D (CITRACAL+D) 315-200 MG-UNIT per tablet Take 1 tablet by mouth daily. 750 mg    Yes [provider]  carbidopa-levodopa (SINEMET IR) 10-100 MG tablet Take 1 tablet by mouth 2 (two) times daily. Patient taking differently: Take 1 tablet by mouth 3 (three) times daily.  10/30/16  Yes Dohmeier, Asencion Partridge, MD  cholecalciferol (VITAMIN D) 1000 units tablet Take 1,000 Units by mouth daily.    Yes [provider]  cloNIDine (CATAPRES) 0.1 MG tablet Take 0.1 mg by mouth 2 (two) times daily. 10/09/16  Yes [provider]  COLCRYS 0.6 MG tablet Take 0.6 mg by mouth daily. 02/13/13  Yes [provider]  fish oil-omega-3 fatty acids 1000 MG capsule Take 3 g by mouth daily.    Yes [provider]  guaifenesin (HUMIBID E) 400 MG TABS tablet Take 400 mg by mouth 2 (two) times daily as needed (congestion).   Yes [provider]  HYDROcodone-acetaminophen (NORCO/VICODIN) 5-325 MG per tablet Take 1 tablet by mouth 3 (three) times daily.  11/14/12  Yes [provider]  levothyroxine (SYNTHROID, LEVOTHROID) 200 MCG tablet Take 200 mcg by mouth daily before breakfast.    Yes [provider]  losartan-hydrochlorothiazide (HYZAAR) 100-25 MG per tablet Take 1 tablet by mouth daily. 11/04/12  Yes [provider]  metFORMIN (GLUCOPHAGE) 500 MG tablet TAKE 1 TABLET BY MOUTH TWICE A DAY WITH A MEAL (DIABETES) 06/23/17  Yes [provider]  metoprolol tartrate (LOPRESSOR) 50 MG tablet Take 50 mg by mouth 2 (two) times daily.   Yes [provider]  Multiple Vitamin (MULTIVITAMIN) tablet Take 1 tablet by mouth daily.   Yes [provider]  oxymetazoline (AFRIN) 0.05 % nasal spray Place 2 sprays into the nose 2 (two) times daily as needed for congestion.     Yes [provider]  pantoprazole (PROTONIX) 40 MG tablet Take 40 mg by mouth every evening.   Yes [provider]  rizatriptan (MAXALT) 10 MG tablet Take 10 mg by mouth as needed for migraine. May repeat in 2 hours if needed   Yes [provider]  rOPINIRole (REQUIP) 1 MG tablet TAKE 1 TABLET BY MOUTH IN THE MORNING AND 4 TABLETS AT BEDTIME Patient taking differently: TAKE 1mg  BY MOUTH IN THE MORNING AND 4mg  AT BEDTIME 06/08/17  Yes Dohmeier, Asencion Partridge, MD  simvastatin (ZOCOR) 40 MG tablet  08/07/17  Yes [provider]  TRAVATAN Z 0.004 % SOLN ophthalmic solution Place 1 drop into both eyes.  07/25/13  Yes [provider]  VENTOLIN HFA 108 (90 Base) MCG/ACT inhaler Inhale 1-2 puffs into the lungs every 4 (four) hours  as needed for wheezing or shortness of breath.  08/19/15  Yes [provider]  nitroGLYCERIN (NITROSTAT) 0.4 MG SL tablet Place 0.4 mg every 5 (five) minutes as needed under the tongue for chest pain.    [provider]    Physical Exam: Vitals:   08/18/17 1430 08/18/17 1535 08/18/17 1539 08/18/17 1604  BP: (!) 90/57 118/71 116/76   Pulse: 92 96 90   Resp: (!) 30 (!) 28 (!) 26   Temp:  98.5 F (36.9 C) 98.4 F (36.9 C)   TempSrc:  Oral Oral   SpO2: 96% 97% 96% (!) 88%   .TCS Constitutional: Elderly gentleman in moderate respiratory discomfort with tachypnea and increased use of accessory muscles Vitals:   08/18/17 1430 08/18/17 1535 08/18/17 1539 08/18/17 1604  BP: (!) 90/57 118/71 116/76   Pulse: 92 96 90   Resp: (!) 30 (!) 28 (!) 26   Temp:  98.5 F (36.9 C) 98.4 F (36.9 C)   TempSrc:  Oral Oral   SpO2: 96% 97% 96% (!) 88%   Eyes: PERRL, lids and conjunctivae normal ENMT: Mucous membranes are dry.  Posterior pharynx clear of any exudate or lesions.Normal dentition.  Neck: normal, supple, no masses, no thyromegaly Respiratory: clear to auscultation bilaterally decreased breath sounds at the bilateral bases,  no wheezing, coarse breath sounds at the bilateral bases. Normal respiratory effort. No accessory muscle use.  Cardiovascular: Tachycardic rate and rhythm, no murmurs / rubs / gallops. No extremity edema. 2+ pedal pulses. No carotid bruits.  Abdomen: no tenderness, no masses palpated. No hepatosplenomegaly. Bowel sounds positive.  Musculoskeletal: no clubbing / cyanosis. No joint deformity upper and lower extremities. Good ROM, no contractures. Normal muscle tone.  Skin: no rashes, lesions, ulcers. No induration, hot and dry Neurologic: CN 2-12 grossly intact. Sensation intact, DTR normal. Strength 5/5 in all 4.  Psychiatric: Normal judgment and insight. Alert and oriented x 3. Normal mood.   Labs on Admission: I have personally reviewed following labs and imaging studies  CBC: Recent Labs  Lab 08/18/17 1230  WBC 13.1*  NEUTROABS 10.7*  HGB 13.9  HCT 41.0  MCV 88.4  PLT 182   Basic Metabolic Panel: Recent Labs  Lab 08/18/17 1230  NA 143  K 3.0*  CL 101  CO2 26  GLUCOSE 112*  BUN 32*  CREATININE 0.98  CALCIUM 9.0   GFR: Estimated Creatinine Clearance: 68.8 mL/min (by C-G formula based on SCr of 0.98 mg/dL). Liver Function Tests: Recent Labs  Lab 08/18/17 1230  AST 28  ALT 20  ALKPHOS 67  BILITOT 0.6  PROT 6.7  ALBUMIN 3.2*   No results for input(s): LIPASE, AMYLASE in the last 168 hours. No results for input(s): AMMONIA in the last 168 hours. Coagulation Profile: No results for input(s): INR, PROTIME in the last 168 hours. Cardiac Enzymes: Recent Labs  Lab 08/18/17 1230  TROPONINI <0.03   BNP (last 3 results) Recent Labs    12/26/16 1208  PROBNP 86.0   HbA1C: No results for input(s): HGBA1C in the last 72 hours. CBG: Recent Labs  Lab 08/18/17 1303  GLUCAP 93   Lipid Profile: No results for input(s): CHOL, HDL, LDLCALC, TRIG, CHOLHDL, LDLDIRECT in the last 72 hours. Thyroid Function Tests: No results for input(s): TSH, T4TOTAL, FREET4, T3FREE,  THYROIDAB in the last 72 hours. Anemia Panel: No results for input(s): VITAMINB12, FOLATE, FERRITIN, TIBC, IRON, RETICCTPCT in the last 72 hours. Urine analysis:    Component Value Date/Time  COLORURINE YELLOW 06/20/2017 1129   APPEARANCEUR TURBID (A) 06/20/2017 1129   LABSPEC 1.020 06/20/2017 1129   PHURINE 5.5 06/20/2017 1129   GLUCOSEU NEGATIVE 06/20/2017 1129   HGBUR TRACE (A) 06/20/2017 1129   BILIRUBINUR NEGATIVE 06/20/2017 1129   KETONESUR NEGATIVE 06/20/2017 1129   PROTEINUR NEGATIVE 06/20/2017 1129   UROBILINOGEN 1.0 09/09/2010 1045   NITRITE POSITIVE (A) 06/20/2017 1129   LEUKOCYTESUR LARGE (A) 06/20/2017 1129    Radiological Exams on Admission: Dg Chest 2 View  Result Date: 08/18/2017 CLINICAL DATA:  Cough, congestion, shortness of breath EXAM: CHEST  2 VIEW COMPARISON:  06/20/2017 FINDINGS: Bilateral diffuse interstitial thickening and peribronchial cuffing most concerning for bronchitis. There is no pleural effusion or pneumothorax. The heart and mediastinal contours are unremarkable. The osseous structures are unremarkable. IMPRESSION: Bilateral diffuse interstitial thickening and peribronchial cuffing most concerning for bronchitis. Electronically Signed   By: Kathreen Devoid   On: 08/18/2017 13:36   Ct Angio Chest Pe W/cm &/or Wo Cm  Result Date: 08/18/2017 CLINICAL DATA:  Shortness of breath. EXAM: CT ANGIOGRAPHY CHEST WITH CONTRAST TECHNIQUE: Multidetector CT imaging of the chest was performed using the standard protocol during bolus administration of intravenous contrast. Multiplanar CT image reconstructions and MIPs were obtained to evaluate the vascular anatomy. CONTRAST:  80 mL ISOVUE-370 IOPAMIDOL (ISOVUE-370) INJECTION 76% COMPARISON:  CT scan of January 03, 2017. FINDINGS: Cardiovascular: Satisfactory opacification of the pulmonary arteries to the segmental level. No evidence of pulmonary embolism. Normal heart size. No pericardial effusion. Atherosclerosis of thoracic  aorta is noted without dissection. 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 Mediastinum/Nodes: Status post thyroidectomy. Esophagus is unremarkable. No mediastinal adenopathy is noted. Lungs/Pleura: No pneumothorax is noted. Mild left pleural effusion is noted with adjacent left lower lobe atelectasis. Bilateral upper lobe opacities are noted most consistent with subsegmental atelectasis. Upper Abdomen: No acute abnormality. Musculoskeletal: No chest wall abnormality. No acute or significant osseous findings. Review of the MIP images confirms the above findings. IMPRESSION: No definite evidence of pulmonary embolus. 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 Recommend annual imaging followup by CTA or MRA. This recommendation follows 2010 ACCF/AHA/AATS/ACR/ASA/SCA/SCAI/SIR/STS/SVM Guidelines for the Diagnosis and Management of Patients with Thoracic Aortic Disease. Circulation. 2010; 121: P295-J884. Mild left pleural effusion with adjacent left lower lobe atelectasis. Bilateral upper lobe subsegmental atelectasis is noted as well. Aortic Atherosclerosis (ICD10-I70.0). Electronically Signed   By: Marijo Conception, M.D.   On: 08/18/2017 15:34    EKG: Independently reviewed.  Sinus tachycardia with atrial premature complexes, right bundle branch block and left anterior fascicular block.  When compared to January 2019 the patient's rate is faster.  Assessment/Plan Principal Problem:   Acute respiratory failure with hypoxemia (HCC) Active Problems:   Sleep apnea with use of continuous positive airway pressure (CPAP)   Obesity (BMI 30-39.9)   RLS (restless legs syndrome)   Primary gout   Dyspnea   Muscle atrophy of lower extremity   Bowel and bladder incontinence   Neuropathy   Hypokalemia   Leukocytosis  1.  Acute respiratory failure with hypoxemia: Patient is requiring oxygen he has never needed this in the past.  This appears to be a new oxygen requirement associated with an acute  respiratory illness in a patient with sleep apnea.  It is likely that this patient has pneumonia and that his chest x-ray has not shown it because the pneumonia has not yet "blossomed".  Patient be admitted into the hospital will start him on IV antibiotics cefepime  and vancomycin and will follow his chest x-ray tomorrow morning.  By then the pneumonia should be present on the x-ray.  If not then the patient may have a complicated bronchitis causing hypoxemia.  2.  Sleep apnea with continuous positive airway pressure: Continue home CPAP machine with home settings.  3.  Obesity: Noted weight loss recommended.  4.  Restless leg syndrome continue home medications.  5.  Primary gout: Continue home medications.  6.  Dyspnea this is a new worsening of a chronic symptom patient says he has had dyspnea for years.  His wife states that his muscle weakness has not affected his lungs.  I am not sure if this is related to his chronic muscle weakness or if this is associated with his upper respiratory symptoms.  We will monitor closely.  7.  Muscle atrophy of lower extremities: His neurologist Dr. Brett Fairy is managing this and will continue with outpatient treatment.  8.  Bowel and bladder incontinence: This has been a problem associated with his muscle weakness.  9.  Neuropathy please see #7.  10.  Hypokalemia: We will replete with sodium chloride with 40 mEq of potassium per liter at 125 an hour for 2 L.  11.  Leukocytosis likely associated with his pneumonia versus bronchitis.     DVT prophylaxis: Lovenox Code Status: Full code Family Communication: Patient, his wife and his daughter who are present at the time of admission.   Disposition Plan: Likely home in 3-4 days Consults called: None Admission status: Inpatient   Lady Deutscher MD FACP [facility] Pager [phone]  If 7PM-7AM, please contact night-coverage [email or web address] Password [facility]  08/18/2017, 6:29 PM

## 2017-08-18 NOTE — ED Provider Notes (Signed)
[facility] DEPT Provider Note   CSN: [id number] Arrival date & time: 08/18/17  1152     History   Chief Complaint Chief Complaint  Patient presents with  . Shortness of Breath    HPI [name] is a 77 y.o. male.  HPI  76 year old male presents with shortness of breath.  History is taken from patient and wife.  The patient has had leg weakness and inability to walk and is wheelchair bound since December 2018.  He has been having physical therapy come to his house.  Therapist yesterday noticed his oxygen saturation was 89% and call the doctor and the nurse was to come see them.  However the nurse never came.  This morning the patient is now short of breath and having a hard time breathing.  He was trying to use the CPAP tubing as oxygen to help him breathe.  He has a chronic cough that is not worse than typical.  He denies any chest pain or tightness.  No fevers, sore throat, rhinorrhea.  No leg swelling.  Patient was put on oxygen by EMS, he states that he does not feel any different with the oxygen on.  Past Medical History:  Diagnosis Date  . Barrett esophagus   . CAD (coronary artery disease)   . Cancer ([place])    thyroid  . Carotid artery occlusion   . Chronic kidney disease   . COPD (chronic obstructive pulmonary disease) ([place])   . Diabetes mellitus without complication ([place])   . GERD (gastroesophageal reflux disease)   . History of thyroid cancer 2008  . History of thyroid cancer   . Hypertension   . OSA (obstructive sleep apnea)   . Restless leg   . Sleep apnea with use of continuous positive airway pressure (CPAP)    2011 [facility] , [facility]  77cn central and obstrcutive. 16 cm water , 3 cm EPR.   . Thyroid disease   . Ulcer    Peptic ulcer disease    Patient Active Problem List   Diagnosis Date Noted  . Muscle atrophy of lower extremity 08/09/2017  . Bowel and bladder incontinence 08/09/2017  . Right-sided muscle weakness  08/09/2017  . Neuropathy 08/09/2017  . 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/2017  . Abnormality of gait 07/12/2017  . Myoclonic jerkings, 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/2017  . Acquired right foot drop 07/12/2017  . UTI (urinary tract infection) due to Enterococcus 07/12/2017  . Pressure injury of skin 06/21/2017  . Acute cystitis 06/20/2017  . Generalized weakness 06/20/2017  . Dehydration 06/20/2017  . Dyspnea 12/26/2016  . Chronic cough 12/26/2016  . Hypersomnia with sleep apnea 10/30/2016  . CSA (central sleep apnea) 10/30/2016  . Wheezing symptom 10/30/2016  . CPAP/BiPAP dependent 07/01/2015  . 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 RLS (restless legs syndrome) 08/25/2014  . Primary gout 08/25/2014  . OSA on CPAP 08/25/2014  . Severe obesity (BMI >= 40) ([place]) 08/25/2014  . Obesity (BMI 30-39.9) 02/18/2013  . Sleep apnea with use of continuous positive airway pressure (CPAP)   . 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 S/P TKR (total knee replacement) 11/29/2012  . History of thyroid cancer     Past Surgical History:  Procedure Laterality Date  . CAROTID ENDARTERECTOMY Left January 03, 2006   Dr. Amedeo Plenty  . JOINT REPLACEMENT Left    knee X 2  . THYROIDECTOMY  2008  Home Medications    Prior to Admission medications   Medication Sig Start Date End Date Taking? Authorizing Provider  allopurinol (ZYLOPRIM) 300 MG tablet Take 300 mg by mouth daily.  06/02/15   [provider]  ANORO ELLIPTA 62.5-25 MCG/INH AEPB Inhale 1 puff into the lungs daily. 02/06/17   Juanito Doom, MD  ARNUITY ELLIPTA 100 MCG/ACT AEPB Inhale 1 puff into the lungs daily. 02/06/17   Juanito Doom, MD  aspirin 325 MG tablet Take 325 mg by mouth daily.    [provider]  calcium citrate-vitamin D (CITRACAL+D) 315-200 MG-UNIT per tablet Take 1 tablet by mouth daily. 750 mg     [provider]    carbidopa-levodopa (SINEMET IR) 10-100 MG tablet Take 1 tablet by mouth 2 (two) times daily. Patient taking differently: Take 1 tablet by mouth 3 (three) times daily.  10/30/16   Dohmeier, Asencion Partridge, MD  cholecalciferol (VITAMIN D) 1000 units tablet Take 1,000 Units by mouth daily.     [provider]  cloNIDine (CATAPRES) 0.1 MG tablet Take 0.1 mg by mouth 2 (two) times daily. 10/09/16   [provider]  COLCRYS 0.6 MG tablet Take 0.6 mg by mouth daily. 02/13/13   [provider]  fish oil-omega-3 fatty acids 1000 MG capsule Take 3 g by mouth daily.     [provider]  Fluticasone Furoate (ARNUITY ELLIPTA) 200 MCG/ACT AEPB Inhale 200 mcg into the lungs daily.    [provider]  guaifenesin (HUMIBID E) 400 MG TABS tablet Take 400 mg by mouth 2 (two) times daily as needed (congestion).    [provider]  HYDROcodone-acetaminophen (NORCO/VICODIN) 5-325 MG per tablet Take 1 tablet by mouth 3 (three) times daily.  11/14/12   [provider]  levothyroxine (SYNTHROID, LEVOTHROID) 200 MCG tablet Take 200 mcg by mouth daily before breakfast.     [provider]  losartan-hydrochlorothiazide (HYZAAR) 100-25 MG per tablet Take 1 tablet by mouth daily. 11/04/12   [provider]  metformin (FORTAMET) 500 MG (OSM) 24 hr tablet Take 500 mg by mouth 2 (two) times daily with a meal.     [provider]  metFORMIN (GLUCOPHAGE) 500 MG tablet TAKE 1 TABLET BY MOUTH TWICE A DAY WITH A MEAL (DIABETES) 06/23/17   [provider]  metoprolol tartrate (LOPRESSOR) 50 MG tablet Take 50 mg by mouth 2 (two) times daily.    [provider]  Multiple Vitamin (MULTIVITAMIN) tablet Take 1 tablet by mouth daily.    [provider]  nitroGLYCERIN (NITROSTAT) 0.4 MG SL tablet Place 0.4 mg every 5 (five) minutes as needed under the tongue for chest pain.    [provider]  oxymetazoline (AFRIN) 0.05 % nasal spray  Place 2 sprays into the nose 2 (two) times daily as needed for congestion.     [provider]  pantoprazole (PROTONIX) 40 MG tablet Take 40 mg by mouth every evening.    [provider]  potassium chloride SA (K-DUR,KLOR-CON) 20 MEQ tablet Take 20 mEq by mouth daily. For seven days 06/18/17   [provider]  rizatriptan (MAXALT) 10 MG tablet Take 10 mg by mouth as needed for migraine. May repeat in 2 hours if needed    [provider]  rOPINIRole (REQUIP) 1 MG tablet TAKE 1 TABLET BY MOUTH IN THE MORNING AND 4 TABLETS AT BEDTIME Patient taking differently: TAKE 1mg  BY MOUTH IN THE MORNING AND 4mg  AT BEDTIME 06/08/17  Dohmeier, Asencion Partridge, MD  simvastatin (ZOCOR) 40 MG tablet  08/07/17   [provider]  TRAVATAN Z 0.004 % SOLN ophthalmic solution Place 1 drop into both eyes.  07/25/13   [provider]  VENTOLIN HFA 108 (90 Base) MCG/ACT inhaler Inhale 1-2 puffs into the lungs every 4 (four) hours as needed for wheezing or shortness of breath.  08/19/15   [provider]    Family History Family History  Problem Relation Age of Onset  . COPD Mother   . Hyperlipidemia Mother   . Hypertension Mother   . Diabetes Father   . Kidney disease Father        ESRD  . Hypertension Father   . Cancer Father   . Hyperlipidemia Father     Social History Social History   Tobacco Use  . Smoking status: Former Smoker    Years: 40.00    Types: Cigars, Cigarettes    Last attempt to quit: 06/19/2005    Years since quitting: 12.1  . Smokeless tobacco: Never Used  . Tobacco comment: smoked 4-6 cigars daily, only smoked cigarettes socially  Substance Use Topics  . Alcohol use: No  . Drug use: No     Allergies   Patient has no known allergies.   Review of Systems Review of Systems  Constitutional: Negative for fever.  Respiratory: Positive for cough (chronic) and shortness of breath.   Cardiovascular: Negative for chest pain and leg  swelling.  Gastrointestinal: Negative for abdominal pain and vomiting.  Neurological: Positive for weakness.  All other systems reviewed and are negative.    Physical Exam Updated Vital Signs BP 116/76 (BP Location: Right Arm)   Pulse 90   Temp 98.4 F (36.9 C) (Oral)   Resp (!) 26   SpO2 (!) 88%   Physical Exam  Constitutional: He is oriented to person, place, and time. He appears well-developed and well-nourished. No distress.  HENT:  Head: Normocephalic and atraumatic.  Right Ear: External ear normal.  Left Ear: External ear normal.  Nose: Nose normal.  Eyes: Right eye exhibits no discharge. Left eye exhibits no discharge.  Neck: Neck supple.  Cardiovascular: Regular rhythm and normal heart sounds. Tachycardia present.  HR low 100s  Pulmonary/Chest: No accessory muscle usage. Tachypnea noted. He has decreased breath sounds in the left lower field. He has rales (mild).  Abdominal: Soft. There is no tenderness.  Musculoskeletal: He exhibits no edema.  Neurological: He is alert and oriented to person, place, and time.  Skin: Skin is warm and dry. He is not diaphoretic.  Nursing note and vitals reviewed.    ED Treatments / Results  Labs (all labs ordered are listed, but only abnormal results are displayed) Labs Reviewed  COMPREHENSIVE METABOLIC PANEL - Abnormal; Notable for the following components:      Result Value   Potassium 3.0 (*)    Glucose, Bld 112 (*)    BUN 32 (*)    Albumin 3.2 (*)    Anion gap 16 (*)    All other components within normal limits  CBC WITH DIFFERENTIAL/PLATELET - Abnormal; Notable for the following components:   WBC 13.1 (*)    Neutro Abs 10.7 (*)    All other components within normal limits  CULTURE, BLOOD (ROUTINE X 2)  CULTURE, BLOOD (ROUTINE X 2)  TROPONIN I  BRAIN NATRIURETIC PEPTIDE  INFLUENZA PANEL BY PCR (TYPE A & B)  CBG MONITORING, ED    EKG  EKG Interpretation  Date/Time:  Saturday August 18 2017 12:01:21  EST Ventricular Rate:  102 PR Interval:    QRS Duration: 133 QT Interval:  376 QTC Calculation: 490 R Axis:   -75 Text Interpretation:  Sinus tachycardia Atrial premature complexes RBBB and LAFB rate is faster compared to Jan 2019 Confirmed by Sherwood Gambler [phone]) on 08/18/2017 1:01:29 PM Also confirmed by Sherwood Gambler [phone]), editor Laurena Spies [phone])  on 08/18/2017 2:37:18 PM       Radiology Dg Chest 2 View  Result Date: 08/18/2017 CLINICAL DATA:  Cough, congestion, shortness of breath EXAM: CHEST  2 VIEW COMPARISON:  06/20/2017 FINDINGS: Bilateral diffuse interstitial thickening and peribronchial cuffing most concerning for bronchitis. There is no pleural effusion or pneumothorax. The heart and mediastinal contours are unremarkable. The osseous structures are unremarkable. IMPRESSION: Bilateral diffuse interstitial thickening and peribronchial cuffing most concerning for bronchitis. Electronically Signed   By: Kathreen Devoid   On: 08/18/2017 13:36   Ct Angio Chest Pe W/cm &/or Wo Cm  Result Date: 08/18/2017 CLINICAL DATA:  Shortness of breath. EXAM: CT ANGIOGRAPHY CHEST WITH CONTRAST TECHNIQUE: Multidetector CT imaging of the chest was performed using the standard protocol during bolus administration of intravenous contrast. Multiplanar CT image reconstructions and MIPs were obtained to evaluate the vascular anatomy. CONTRAST:  80 mL ISOVUE-370 IOPAMIDOL (ISOVUE-370) INJECTION 76% COMPARISON:  CT scan of January 03, 2017. FINDINGS: Cardiovascular: Satisfactory opacification of the pulmonary arteries to the segmental level. No evidence of pulmonary embolism. Normal heart size. No pericardial effusion. Atherosclerosis of thoracic aorta is noted without dissection. 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 Mediastinum/Nodes: Status post thyroidectomy. Esophagus is unremarkable. No mediastinal adenopathy is noted. Lungs/Pleura: No pneumothorax is noted. Mild left pleural effusion is  noted with adjacent left lower lobe atelectasis. Bilateral upper lobe opacities are noted most consistent with subsegmental atelectasis. Upper Abdomen: No acute abnormality. Musculoskeletal: No chest wall abnormality. No acute or significant osseous findings. Review of the MIP images confirms the above findings. IMPRESSION: No definite evidence of pulmonary embolus. 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 Recommend annual imaging followup by CTA or MRA. This recommendation follows 2010 ACCF/AHA/AATS/ACR/ASA/SCA/SCAI/SIR/STS/SVM Guidelines for the Diagnosis and Management of Patients with Thoracic Aortic Disease. Circulation. 2010; 121: M629-U765. Mild left pleural effusion with adjacent left lower lobe atelectasis. Bilateral upper lobe subsegmental atelectasis is noted as well. Aortic Atherosclerosis (ICD10-I70.0). Electronically Signed   By: Marijo Conception, M.D.   On: 08/18/2017 15:34    Procedures Procedures (including critical care time)  Medications Ordered in ED Medications  sodium chloride 0.9 % injection (not administered)  ceFEPIme (MAXIPIME) 2 g in sodium chloride 0.9 % 100 mL IVPB (2 g Intravenous New Bag/Given 08/18/17 1633)  vancomycin (VANCOCIN) 1,500 mg in sodium chloride 0.9 % 500 mL IVPB (not administered)  albuterol (PROVENTIL) (2.5 MG/3ML) 0.083% nebulizer solution 2.5 mg (2.5 mg Nebulization Given 08/18/17 1339)  potassium chloride SA (K-DUR,KLOR-CON) CR tablet 40 mEq (40 mEq Oral Given 08/18/17 1441)  iopamidol (ISOVUE-370) 76 % injection (100 mLs  Contrast Given 08/18/17 1446)     Initial Impression / Assessment and Plan / ED Course  I have reviewed the triage vital signs and the nursing notes.  Pertinent labs & imaging results that were available during my care of the patient were reviewed by me and considered in my medical decision making (see chart for details).     Patient is mildly hypoxic into the high 80s.  Given his tachypnea and immobility, CT obtained to help rule  out PE given benign chest x-ray.  This shows pleural effusion but no other significant findings such as PE.  He will be treated as an infectious source given his low-grade fever as well.  He was admitted a couple months ago for a couple days and so will be treated as hospital-acquired pneumonia.  Hospitalist to admit.  Airway is stable.  Final Clinical Impressions(s) / ED Diagnoses   Final diagnoses:  HCAP (healthcare-associated pneumonia)  Pleural effusion on left    ED Discharge Orders    None       Sherwood Gambler, MD 08/18/17 [phone]

## 2017-08-18 NOTE — Progress Notes (Signed)
A consult was received from an ED physician for vancomycin and cefepime per pharmacy dosing.  The patient's profile has been reviewed for ht/wt/allergies/indication/available labs.   A one time order has been placed for vancomycin 1500mg  and cefepime 2g.    Further antibiotics/pharmacy consults should be ordered by admitting physician if indicated.                       Thank you, Peggyann Juba, PharmD, BCPS 08/18/2017  3:49 PM

## 2017-08-18 NOTE — ED Notes (Signed)
Report to Rosaria Ferries, RN on [address]. Will transport shortly.

## 2017-08-18 NOTE — ED Notes (Signed)
He drops his SPO2 to 88% on rm. Air and becomes uncomfortably short of breath in the absence of O2. He states he does not use O2 at home, however, he does use CPAP. He remains in no distress. Plan to administer antibiotics after B.C. Obtained.

## 2017-08-18 NOTE — ED Notes (Signed)
ED TO INPATIENT HANDOFF REPORT  Name/Age/Gender [name] 77 y.o. male  Code Status    Code Status Orders  (From admission, onward)        Start     Ordered   08/18/17 1705  Full code  Continuous     08/18/17 1708    Code Status History    Date Active Date Inactive Code Status Order ID Comments User Context   06/20/2017 17:07 06/23/2017 18:37 DNR [id number]  Modena Jansky, MD ED      Home/SNF/Other Home  Chief Complaint [facility]  Level of Care/Admitting Diagnosis ED Disposition    ED Disposition Condition [place]: [facility] [[id number]]  Level of Care: Med-Surg [16]  Diagnosis: Acute respiratory failure with hypoxemia [facility]) [[id number]]  Admitting Physician: Lady Deutscher [[id number]]  Attending Physician: Lady Deutscher [[id number]]  Estimated length of stay: past midnight tomorrow  Certification:: I certify this patient will need inpatient services for at least 2 midnights  PT Class (Do Not Modify): Inpatient [101]  PT Acc Code (Do Not Modify): Private [1]       Medical History Past Medical History:  Diagnosis Date  . Barrett esophagus   . CAD (coronary artery disease)   . Cancer ([place])    thyroid  . Carotid artery occlusion   . Chronic kidney disease   . COPD (chronic obstructive pulmonary disease) ([place])   . Diabetes mellitus without complication ([place])   . GERD (gastroesophageal reflux disease)   . History of thyroid cancer 2008  . History of thyroid cancer   . Hypertension   . OSA (obstructive sleep apnea)   . Restless leg   . Sleep apnea with use of continuous positive airway pressure (CPAP)    2011 [facility] , AHI  77cn central and obstrcutive. 16 cm water , 3 cm EPR.   . Thyroid disease   . Ulcer    Peptic ulcer disease    Allergies No Known Allergies  IV Location/Drains/Wounds Patient Lines/Drains/Airways Status   Active Line/Drains/Airways    Name:   Placement date:   Placement time:   Site:    Days:   Peripheral IV 08/18/17 Right;Posterior Forearm   08/18/17    1237    Forearm   less than 1          Labs/Imaging Results for orders placed or performed during the hospital encounter of 08/18/17 (from the past 48 hour(s))  Comprehensive metabolic panel     Status: Abnormal   Collection Time: 08/18/17 12:30 PM  Result Value Ref Range   Sodium 143 135 - 145 mmol/L   Potassium 3.0 (L) 3.5 - 5.1 mmol/L   Chloride 101 101 - 111 mmol/L   CO2 26 22 - 32 mmol/L   Glucose, Bld 112 (H) 65 - 99 mg/dL   BUN 32 (H) 6 - 20 mg/dL   Creatinine, Ser 0.98 0.61 - 1.24 mg/dL   Calcium 9.0 8.9 - 10.3 mg/dL   Total Protein 6.7 6.5 - 8.1 g/dL   Albumin 3.2 (L) 3.5 - 5.0 g/dL   AST 28 15 - 41 U/L   ALT 20 17 - 63 U/L   Alkaline Phosphatase 67 38 - 126 U/L   Total Bilirubin 0.6 0.3 - 1.2 mg/dL   GFR calc non Af Amer >60 >60 mL/min   GFR calc Af Amer >60 >60 mL/min    Comment: (NOTE) The eGFR has been calculated using the  CKD EPI equation. This calculation has not been validated in all clinical situations. eGFR's persistently <60 mL/min signify possible Chronic Kidney Disease.    Anion gap 16 (H) 5 - 15    Comment: Performed at [facility], [place] [address] [place], [place] [address]  Troponin I     Status: None   Collection Time: 08/18/17 12:30 PM  Result Value Ref Range   Troponin I <0.03 <0.03 ng/mL    Comment: Performed at [facility], [place] [address], [place], [place] [address]  Brain natriuretic peptide     Status: None   Collection Time: 08/18/17 12:30 PM  Result Value Ref Range   B Natriuretic Peptide 68.9 0.0 - 100.0 pg/mL    Comment: Performed at [facility], [place] [address], [place], [place] [address]  CBC with Differential     Status: Abnormal   Collection Time: 08/18/17 12:30 PM  Result Value Ref Range   WBC 13.1 (H) 4.0 - 10.5 K/uL   RBC 4.64 4.22 - 5.81 MIL/uL   Hemoglobin 13.9 13.0 - 17.0 g/dL   HCT 41.0 39.0 -  52.0 %   MCV 88.4 78.0 - 100.0 fL   MCH 30.0 26.0 - 34.0 pg   MCHC 33.9 30.0 - 36.0 g/dL   RDW 14.9 11.5 - 15.5 %   Platelets 301 150 - 400 K/uL   Neutrophils Relative % 82 %   Neutro Abs 10.7 (H) 1.7 - 7.7 K/uL   Lymphocytes Relative 11 %   Lymphs Abs 1.4 0.7 - 4.0 K/uL   Monocytes Relative 6 %   Monocytes Absolute 0.8 0.1 - 1.0 K/uL   Eosinophils Relative 1 %   Eosinophils Absolute 0.2 0.0 - 0.7 K/uL   Basophils Relative 0 %   Basophils Absolute 0.0 0.0 - 0.1 K/uL    Comment: Performed at [facility], [place] [address], [place], [place] [address]  CBG monitoring, ED     Status: None   Collection Time: 08/18/17  1:03 PM  Result Value Ref Range   Glucose-Capillary 93 65 - 99 mg/dL   Dg Chest 2 View  Result Date: 08/18/2017 CLINICAL DATA:  Cough, congestion, shortness of breath EXAM: CHEST  2 VIEW COMPARISON:  06/20/2017 FINDINGS: Bilateral diffuse interstitial thickening and peribronchial cuffing most concerning for bronchitis. There is no pleural effusion or pneumothorax. The heart and mediastinal contours are unremarkable. The osseous structures are unremarkable. IMPRESSION: Bilateral diffuse interstitial thickening and peribronchial cuffing most concerning for bronchitis. Electronically Signed   By: Kathreen Devoid   On: 08/18/2017 13:36   Ct Angio Chest Pe W/cm &/or Wo Cm  Result Date: 08/18/2017 CLINICAL DATA:  Shortness of breath. EXAM: CT ANGIOGRAPHY CHEST WITH CONTRAST TECHNIQUE: Multidetector CT imaging of the chest was performed using the standard protocol during bolus administration of intravenous contrast. Multiplanar CT image reconstructions and MIPs were obtained to evaluate the vascular anatomy. CONTRAST:  80 mL ISOVUE-370 IOPAMIDOL (ISOVUE-370) INJECTION 76% COMPARISON:  CT scan of January 03, 2017. FINDINGS: Cardiovascular: Satisfactory opacification of the pulmonary arteries to the segmental level. No evidence of pulmonary embolism. Normal heart size. No  pericardial effusion. Atherosclerosis of thoracic aorta is noted without dissection. 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 Mediastinum/Nodes: Status post thyroidectomy. Esophagus is unremarkable. No mediastinal adenopathy is noted. Lungs/Pleura: No pneumothorax is noted. Mild left pleural effusion is noted with adjacent left lower lobe atelectasis. Bilateral upper lobe opacities are noted most consistent with subsegmental  atelectasis. Upper Abdomen: No acute abnormality. Musculoskeletal: No chest wall abnormality. No acute or significant osseous findings. Review of the MIP images confirms the above findings. IMPRESSION: No definite evidence of pulmonary embolus. 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 Recommend annual imaging followup by CTA or MRA. This recommendation follows 2010 ACCF/AHA/AATS/ACR/ASA/SCA/SCAI/SIR/STS/SVM Guidelines for the Diagnosis and Management of Patients with Thoracic Aortic Disease. Circulation. 2010; 121: V253-G644. Mild left pleural effusion with adjacent left lower lobe atelectasis. Bilateral upper lobe subsegmental atelectasis is noted as well. Aortic Atherosclerosis (ICD10-I70.0). Electronically Signed   By: Marijo Conception, M.D.   On: 08/18/2017 15:34    Pending Labs Unresulted Labs (From admission, onward)   Start     Ordered   08/25/17 0500  Creatinine, serum  (enoxaparin (LOVENOX)    CrCl >/= 30 ml/min)  Weekly,   R    Comments:  while on enoxaparin therapy    08/18/17 1708   08/19/17 0347  Basic metabolic panel  Tomorrow morning,   R     08/18/17 1708   08/19/17 0500  CBC  Tomorrow morning,   R     08/18/17 1708   08/18/17 1703  Culture, blood (routine x 2) Call MD if unable to obtain prior to antibiotics being given  BLOOD CULTURE X 2,   R    Comments:  If blood cultures drawn in Emergency Department - Do not draw and cancel order    08/18/17 1708   08/18/17 1703  Culture, sputum-assessment  Once,   R     08/18/17 1708   08/18/17 1703  Gram  stain  Once,   R     08/18/17 1708   08/18/17 1703  HIV antibody  Once,   R     08/18/17 1708   08/18/17 1703  Strep pneumoniae urinary antigen  Once,   R     08/18/17 1708   08/18/17 1615  Influenza panel by PCR (type A & B)  STAT,   STAT     08/18/17 1614   08/18/17 1547  Blood culture (routine x 2)  BLOOD CULTURE X 2,   STAT     08/18/17 1547      Vitals/Pain Today's Vitals   08/18/17 1430 08/18/17 1535 08/18/17 1539 08/18/17 1604  BP: (!) 90/57 118/71 116/76   Pulse: 92 96 90   Resp: (!) 30 (!) 28 (!) 26   Temp:  98.5 F (36.9 C) 98.4 F (36.9 C)   TempSrc:  Oral Oral   SpO2: 96% 97% 96% (!) 88%  PainSc:  3  0-No pain     Isolation Precautions Droplet precaution  Medications Medications  sodium chloride 0.9 % injection (not administered)  vancomycin (VANCOCIN) 1,500 mg in sodium chloride 0.9 % 500 mL IVPB (not administered)  allopurinol (ZYLOPRIM) tablet 300 mg (not administered)  umeclidinium-vilanterol (ANORO ELLIPTA) 62.5-25 MCG/INH 1 puff (not administered)  Fluticasone Furoate AEPB 1 puff (not administered)  aspirin tablet 325 mg (not administered)  calcium citrate-vitamin D (CITRACAL+D) 315-200 MG-UNIT per tablet 1 tablet (not administered)  carbidopa-levodopa (SINEMET IR) 10-100 MG per tablet immediate release 1 tablet (not administered)  cholecalciferol (VITAMIN D) tablet 1,000 Units (not administered)  cloNIDine (CATAPRES) tablet 0.1 mg (not administered)  colchicine tablet 0.6 mg (not administered)  fish oil-omega-3 fatty acids capsule 3 g (not administered)  guaifenesin (HUMIBID E) tablet 400 mg (not administered)  HYDROcodone-acetaminophen (NORCO/VICODIN) 5-325 MG per tablet 1 tablet (not administered)  levothyroxine (SYNTHROID, LEVOTHROID) tablet 200 mcg (  not administered)  losartan-hydrochlorothiazide (HYZAAR) 100-25 MG per tablet 1 tablet (not administered)  metoprolol tartrate (LOPRESSOR) tablet 50 mg (not administered)  pantoprazole (PROTONIX) EC  tablet 40 mg (not administered)  potassium chloride SA (K-DUR,KLOR-CON) CR tablet 20 mEq (not administered)  simvastatin (ZOCOR) tablet 10 mg (not administered)  latanoprost (XALATAN) 0.005 % ophthalmic solution 1 drop (not administered)  albuterol (PROVENTIL HFA;VENTOLIN HFA) 108 (90 Base) MCG/ACT inhaler 1-2 puff (not administered)  enoxaparin (LOVENOX) injection 40 mg (not administered)  acetaminophen (TYLENOL) tablet 650 mg (not administered)    Or  acetaminophen (TYLENOL) suppository 650 mg (not administered)  oxyCODONE (Oxy IR/ROXICODONE) immediate release tablet 5 mg (not administered)  polyethylene glycol (MIRALAX / GLYCOLAX) packet 17 g (not administered)  ondansetron (ZOFRAN) tablet 4 mg (not administered)    Or  ondansetron (ZOFRAN) injection 4 mg (not administered)  ceFEPIme (MAXIPIME) 1 g in sodium chloride 0.9 % 100 mL IVPB (not administered)  0.9 %  sodium chloride infusion (not administered)  albuterol (PROVENTIL) (2.5 MG/3ML) 0.083% nebulizer solution 2.5 mg (2.5 mg Nebulization Given 08/18/17 1339)  potassium chloride SA (K-DUR,KLOR-CON) CR tablet 40 mEq (40 mEq Oral Given 08/18/17 1441)  iopamidol (ISOVUE-370) 76 % injection (100 mLs  Contrast Given 08/18/17 1446)  ceFEPIme (MAXIPIME) 2 g in sodium chloride 0.9 % 100 mL IVPB (2 g Intravenous New Bag/Given 08/18/17 1633)    Mobility walks with device

## 2017-08-18 NOTE — ED Notes (Signed)
Bed: AI90 Expected date: 08/18/17 Expected time: 11:51 AM Means of arrival: Ambulance Comments: [facility]

## 2017-08-18 NOTE — Progress Notes (Signed)
Pharmacy Antibiotic Note  KHUP SAPIA is a 77 y.o. male admitted on 08/18/2017 with pneumonia.  Pharmacy has been consulted for vancomycin dosing.  MD is dosing cefepime.  Today, 08/18/2017 Day #1 antibiotics Afebrile WBC 13.1 SCr 0.98, CrCl ~68 ml/min  Plan: Vancomycin 1500mg  IV x 1, then 1250mg  IV q24h (estimated AUC 467) Check levels at steady state, goal AUC 400-500 Cefepime 1g IV q8h per MD Follow up renal function & cultures, de-escalation as appropriate    Temp (24hrs), Avg:99 F (37.2 C), Min:98.4 F (36.9 C), Max:100 F (37.8 C)  Recent Labs  Lab 08/18/17 1230  WBC 13.1*  CREATININE 0.98    Estimated Creatinine Clearance: 68.8 mL/min (by C-G formula based on SCr of 0.98 mg/dL).    No Known Allergies  Antimicrobials this admission:  3/2 Vanc >> 3/2 Cefepime >>  Dose adjustments this admission:    Microbiology results:  3/2 BCx:  3/2 Sputum:  Thank you for allowing pharmacy to be a part of this patient's care.  Peggyann Juba, PharmD, BCPS Pager: [phone] 08/18/2017 5:33 PM

## 2017-08-19 ENCOUNTER — Inpatient Hospital Stay (HOSPITAL_COMMUNITY): Payer: Medicare Other

## 2017-08-19 DIAGNOSIS — E669 Obesity, unspecified: Secondary | ICD-10-CM

## 2017-08-19 DIAGNOSIS — J441 Chronic obstructive pulmonary disease with (acute) exacerbation: Principal | ICD-10-CM

## 2017-08-19 DIAGNOSIS — G2581 Restless legs syndrome: Secondary | ICD-10-CM

## 2017-08-19 DIAGNOSIS — G4730 Sleep apnea, unspecified: Secondary | ICD-10-CM

## 2017-08-19 LAB — BLOOD CULTURE ID PANEL (REFLEXED)
Acinetobacter baumannii: NOT DETECTED
CANDIDA KRUSEI: NOT DETECTED
CANDIDA PARAPSILOSIS: NOT DETECTED
Candida albicans: NOT DETECTED
Candida glabrata: NOT DETECTED
Candida tropicalis: NOT DETECTED
ENTEROCOCCUS SPECIES: NOT DETECTED
Enterobacter cloacae complex: NOT DETECTED
Enterobacteriaceae species: NOT DETECTED
Escherichia coli: NOT DETECTED
HAEMOPHILUS INFLUENZAE: NOT DETECTED
Klebsiella oxytoca: NOT DETECTED
Klebsiella pneumoniae: NOT DETECTED
LISTERIA MONOCYTOGENES: NOT DETECTED
METHICILLIN RESISTANCE: DETECTED — AB
Neisseria meningitidis: NOT DETECTED
PROTEUS SPECIES: NOT DETECTED
Pseudomonas aeruginosa: NOT DETECTED
SERRATIA MARCESCENS: NOT DETECTED
STAPHYLOCOCCUS AUREUS BCID: NOT DETECTED
STAPHYLOCOCCUS SPECIES: DETECTED — AB
STREPTOCOCCUS AGALACTIAE: NOT DETECTED
Streptococcus pneumoniae: NOT DETECTED
Streptococcus pyogenes: NOT DETECTED
Streptococcus species: NOT DETECTED

## 2017-08-19 LAB — BASIC METABOLIC PANEL
Anion gap: 10 (ref 5–15)
BUN: 25 mg/dL — AB (ref 6–20)
CALCIUM: 8.5 mg/dL — AB (ref 8.9–10.3)
CHLORIDE: 104 mmol/L (ref 101–111)
CO2: 28 mmol/L (ref 22–32)
CREATININE: 0.81 mg/dL (ref 0.61–1.24)
Glucose, Bld: 110 mg/dL — ABNORMAL HIGH (ref 65–99)
Potassium: 3.5 mmol/L (ref 3.5–5.1)
Sodium: 142 mmol/L (ref 135–145)

## 2017-08-19 LAB — CBC
HCT: 37.5 % — ABNORMAL LOW (ref 39.0–52.0)
Hemoglobin: 12.3 g/dL — ABNORMAL LOW (ref 13.0–17.0)
MCH: 29.9 pg (ref 26.0–34.0)
MCHC: 32.8 g/dL (ref 30.0–36.0)
MCV: 91 fL (ref 78.0–100.0)
PLATELETS: 297 10*3/uL (ref 150–400)
RBC: 4.12 MIL/uL — AB (ref 4.22–5.81)
RDW: 15 % (ref 11.5–15.5)
WBC: 10.1 10*3/uL (ref 4.0–10.5)

## 2017-08-19 LAB — GLUCOSE, CAPILLARY: GLUCOSE-CAPILLARY: 97 mg/dL (ref 65–99)

## 2017-08-19 LAB — MRSA PCR SCREENING: MRSA by PCR: POSITIVE — AB

## 2017-08-19 LAB — HIV ANTIBODY (ROUTINE TESTING W REFLEX): HIV SCREEN 4TH GENERATION: NONREACTIVE

## 2017-08-19 LAB — STREP PNEUMONIAE URINARY ANTIGEN: STREP PNEUMO URINARY ANTIGEN: NEGATIVE

## 2017-08-19 IMAGING — DX DG CHEST 2V
2 series · 2 of 2 positions shown · non-contrast
Comparison: Chest x-ray [DATE].

CLINICAL DATA: 76-year-old male with history of pneumonia.
Follow-up study.

EXAM:
CHEST  2 VIEW

[chest lat]
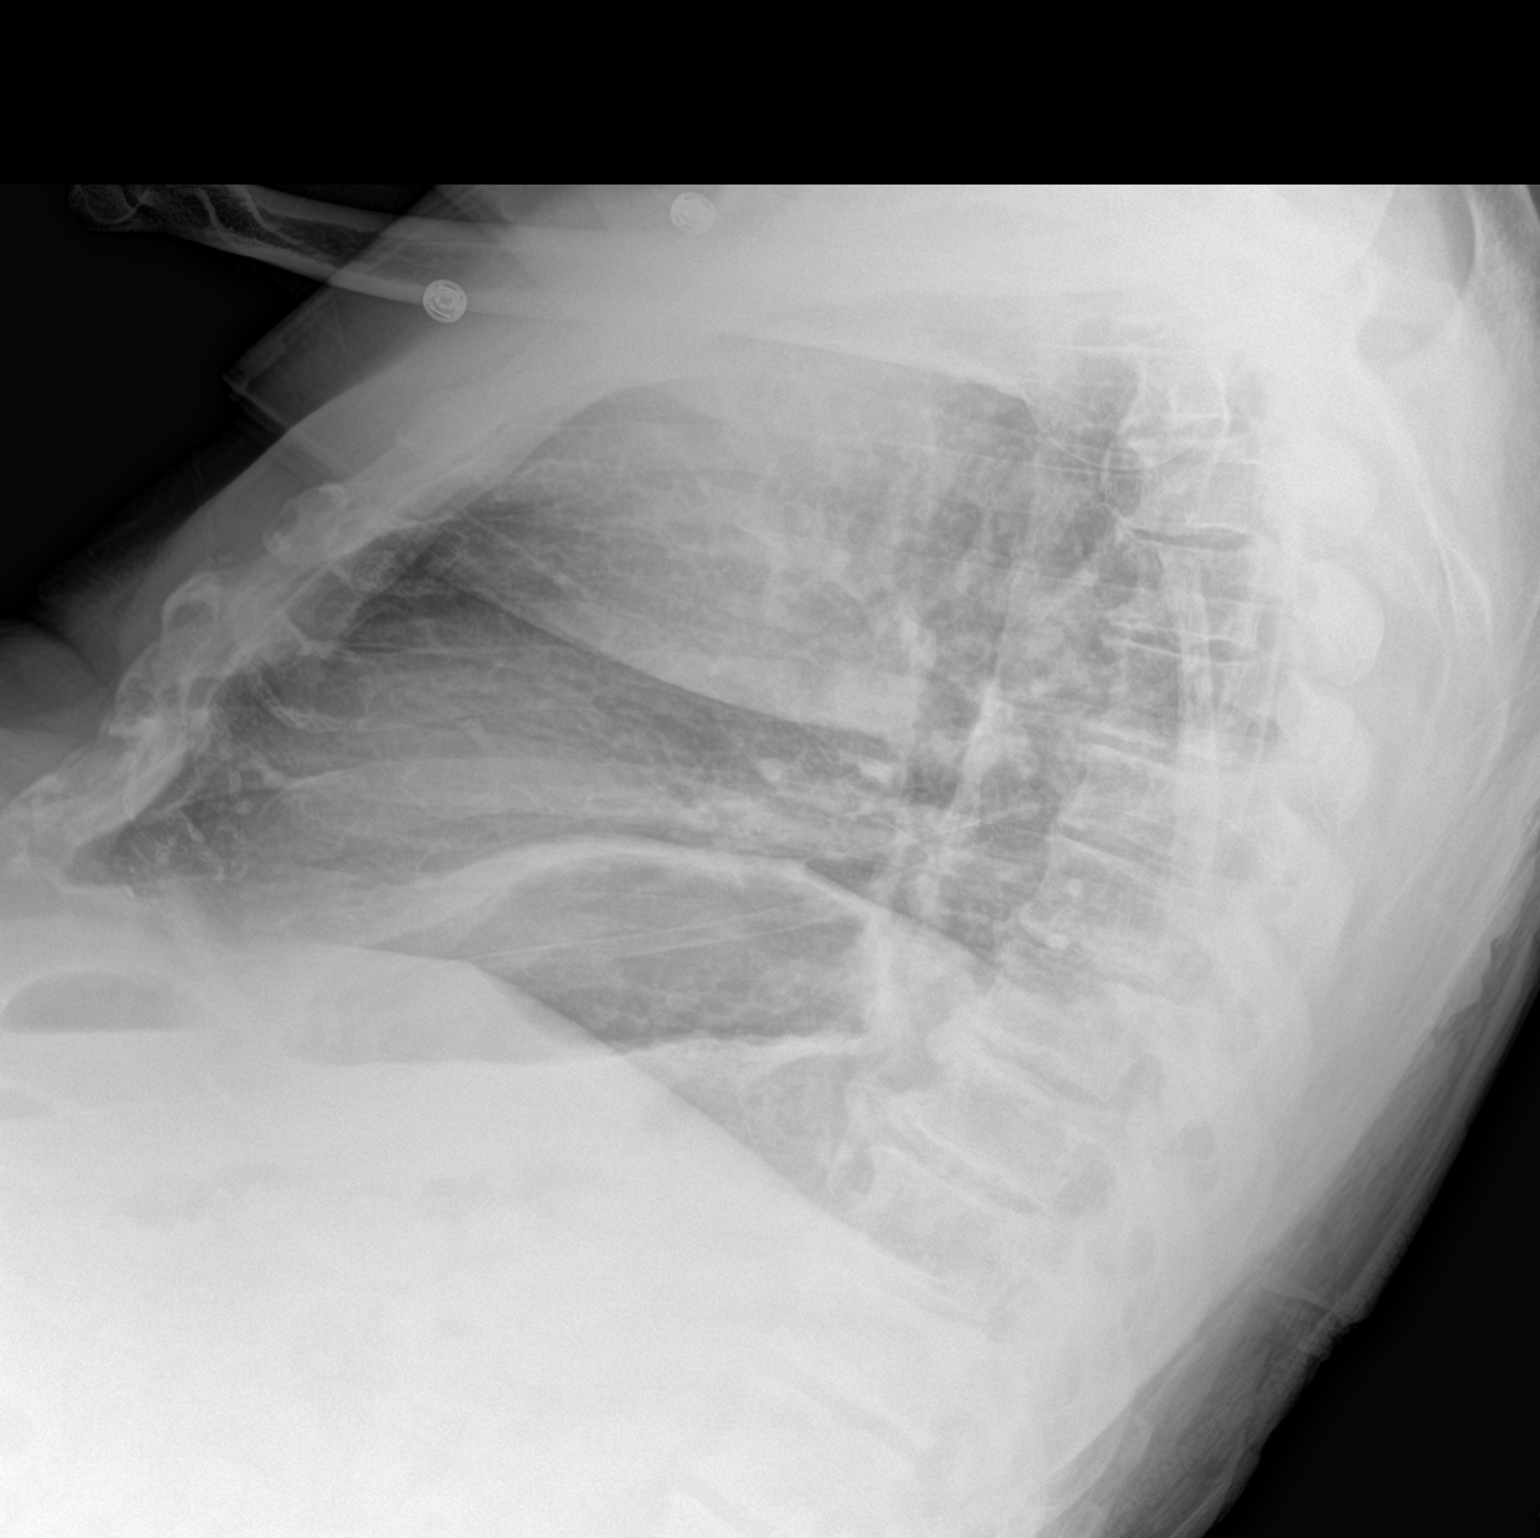

[chest ap]
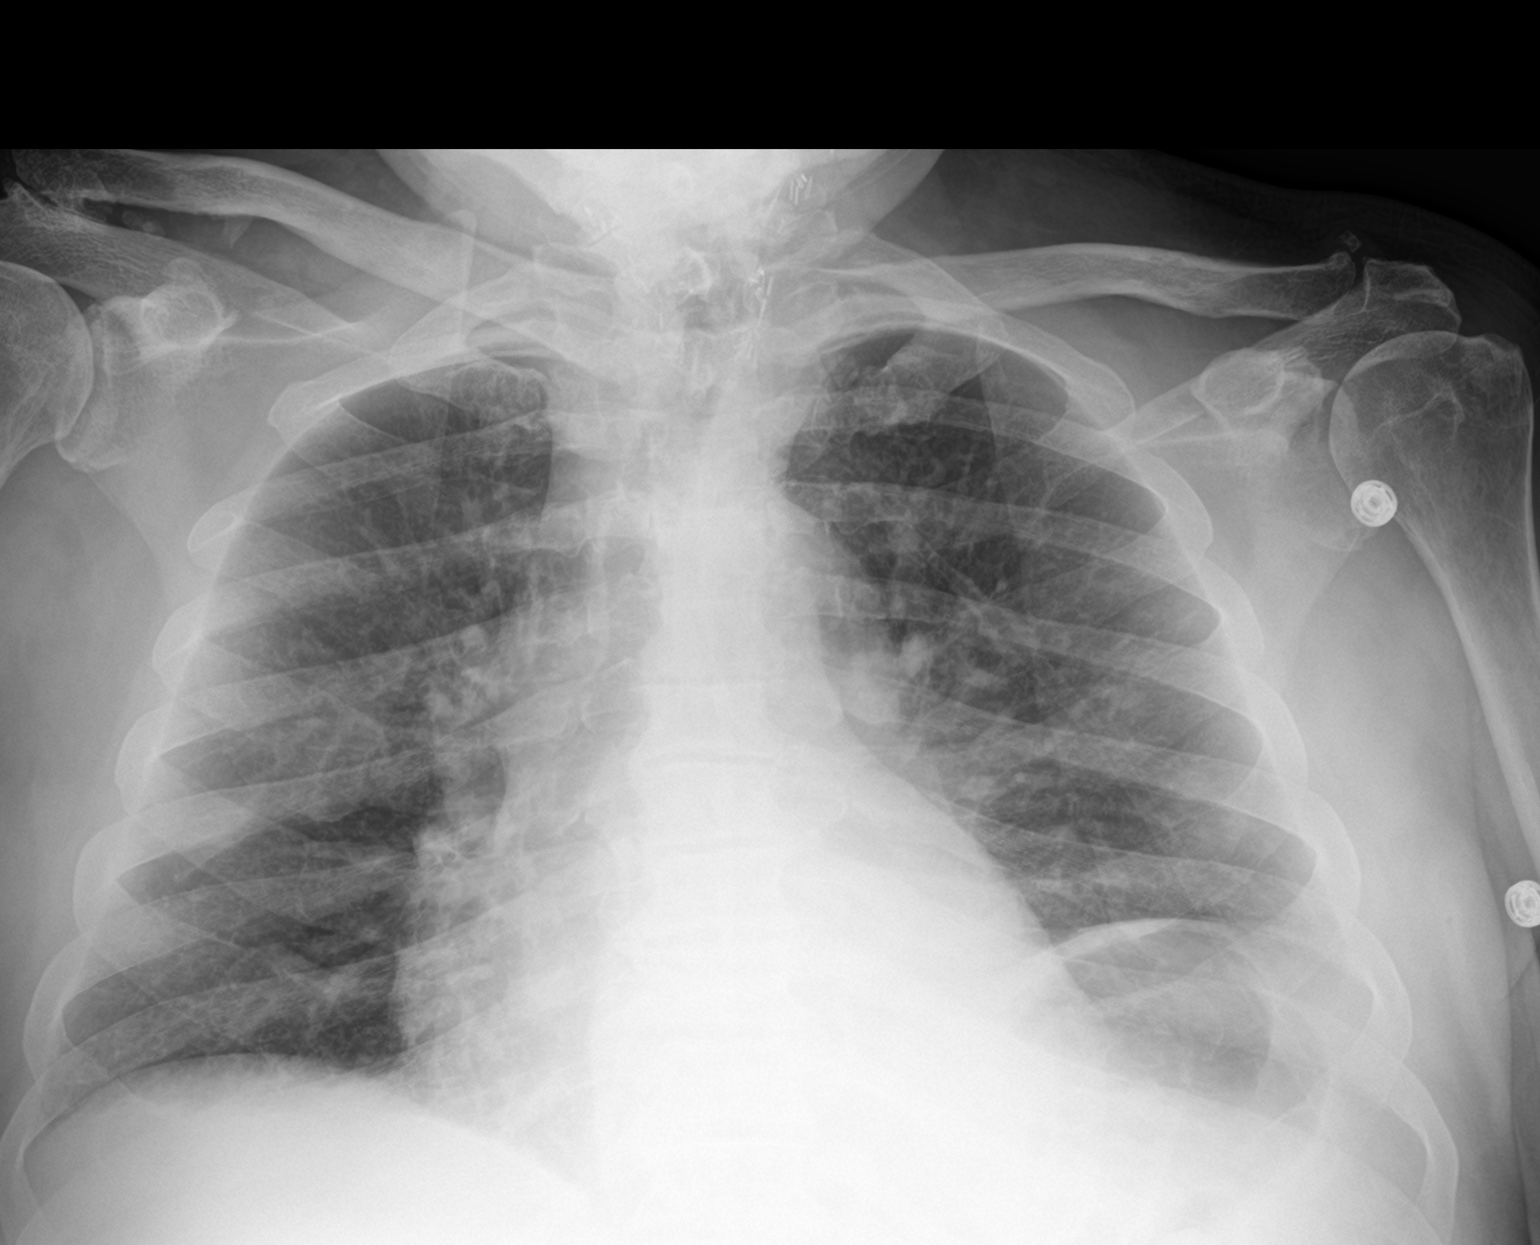

[2 of 2 positions shown; findings below may reference images not displayed]

FINDINGS: Small left pleural effusion with probable subsegmental atelectasis
in the left lung base. Right lung is clear. No definite
consolidative airspace disease. No right pleural effusion. No
pneumothorax. No evidence of pulmonary edema. Heart size is
borderline enlarged. Upper mediastinal contours are within normal
limits. Aortic atherosclerosis. Surgical clips at the level of the
thoracic inlet from prior thyroidectomy.
IMPRESSION: 1. Low lung volumes with subsegmental atelectasis in the left lower
lobe and small left pleural effusion.
2. Aortic atherosclerosis.

## 2017-08-19 MED ORDER — AZITHROMYCIN 250 MG PO TABS
500.0000 mg | ORAL_TABLET | Freq: Every day | ORAL | Status: DC
  Administered 2017-08-19 – 2017-08-23 (×5): 500 mg via ORAL
  Filled 2017-08-19 (×5): qty 2

## 2017-08-19 MED ORDER — MUPIROCIN 2 % EX OINT
1.0000 "application " | TOPICAL_OINTMENT | Freq: Two times a day (BID) | CUTANEOUS | Status: DC
  Administered 2017-08-19 – 2017-08-23 (×9): 1 via NASAL
  Filled 2017-08-19 (×3): qty 22

## 2017-08-19 MED ORDER — METHYLPREDNISOLONE SODIUM SUCC 40 MG IJ SOLR
40.0000 mg | Freq: Every day | INTRAMUSCULAR | Status: DC
  Administered 2017-08-19 – 2017-08-22 (×4): 40 mg via INTRAVENOUS
  Filled 2017-08-19 (×4): qty 1

## 2017-08-19 MED ORDER — CHLORHEXIDINE GLUCONATE CLOTH 2 % EX PADS
6.0000 | MEDICATED_PAD | Freq: Every day | CUTANEOUS | Status: DC
  Administered 2017-08-20 – 2017-08-23 (×4): 6 via TOPICAL

## 2017-08-19 MED ORDER — IPRATROPIUM-ALBUTEROL 0.5-2.5 (3) MG/3ML IN SOLN: 3.0000 mL | RESPIRATORY_TRACT | Status: DC | PRN

## 2017-08-19 MED ORDER — IPRATROPIUM-ALBUTEROL 0.5-2.5 (3) MG/3ML IN SOLN
3.0000 mL | Freq: Four times a day (QID) | RESPIRATORY_TRACT | Status: DC
  Administered 2017-08-19: 3 mL via RESPIRATORY_TRACT
  Filled 2017-08-19: qty 3

## 2017-08-19 MED ORDER — CALCIUM CARBONATE-VITAMIN D 500-200 MG-UNIT PO TABS
1.0000 | ORAL_TABLET | Freq: Every day | ORAL | Status: DC
  Administered 2017-08-19 – 2017-08-23 (×5): 1 via ORAL
  Filled 2017-08-19 (×5): qty 1

## 2017-08-19 MED ORDER — IPRATROPIUM-ALBUTEROL 0.5-2.5 (3) MG/3ML IN SOLN
3.0000 mL | Freq: Three times a day (TID) | RESPIRATORY_TRACT | Status: DC
  Administered 2017-08-20 – 2017-08-21 (×3): 3 mL via RESPIRATORY_TRACT
  Filled 2017-08-19 (×4): qty 3

## 2017-08-19 NOTE — Progress Notes (Signed)
PROGRESS NOTE    [name]  HEN:[id number] DOB: 1940-10-12 DOA: 08/18/2017 PCP: [name], MD    Brief Narrative:  77 year old male who presented with dyspnea.  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  He had progressive and worsening dyspnea, associated with oxygen desaturation down to 89% at home.  On the initial physical examination blood pressure 118/71, heart rate 96, respiratory rate 28, temperature 98.61F, oxygen saturation 88%.  Dry mucous membranes, lungs with coarse breath sounds bilaterally, no significant wheezing, heart S1-S2 present rhythmic tachycardic, abdomen soft nontender, no lower extremity edema.  Sodium 143, potassium 3.0, chloride 101, bicarb 26, glucose 112, BUN 32, creatinine 0.8, white count 13.1, hemoglobin 13.9, hematocrit 41.0, platelets 301.  Chest x-ray hyperinflated, left rotation, increased hilar vascular congestion.  Chest CT negative for pulmonary embolism, left basilar atelectasis with a small pleural effusion, small apical bilateral atelectasis.  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  EKG sinus rhythm, left axis deviation, left anterior fascicular block.   Patient was admitted to the hospital working diagnosis of acute hypoxic respiratory failure due to COPD exacerbation.   Assessment & Plan:   Principal Problem:   Acute respiratory failure with hypoxemia (HCC) Active Problems:   Sleep apnea with use of continuous positive airway pressure (CPAP)   Obesity (BMI 30-39.9)   RLS (restless legs syndrome)   Primary gout   Dyspnea   Muscle atrophy of lower extremity   Bowel and bladder incontinence   Neuropathy   Hypokalemia   Leukocytosis  1. Acute hypoxic respiratory failure due to COPD exacerbation. Will continue supplemental -02 per [place], target 02 saturation  greater than 92%, will continue aggressive bronchodilator therapy and will add systemic steroids. Continue inhaled corticosteroids and long acting insulin. Patient is wheelchair bound. Follow chest film personally reviewed noted no infiltrate, pneumonia ruled out. Will continue azithromycin for airway inflammation.  2. HTN. Will continue blood pressure control with clonidine, losartan/ hctz and metoprolol. NO signs of volume overload.   3. Dyslipidemia. Will continue simvastatin  4. Parkinson's will continue carbidopa and ropinirol.   5. Levothyroxine. Will continue levothyroxine.     DVT prophylaxis: enoxaparin  Code Status:  full Family Communication: I spoke with patient's family at the bedside and all questions were addressed.  Disposition Plan: home   Consultants:     Procedures:     Antimicrobials:   azithromycin    Subjective: Dyspnea continue to improve, at home had dry cough, not on supplemental 02. Uses wheelchair.    Objective: Vitals:   08/19/17 0449 08/19/17 1020 08/19/17 1111 08/19/17 1114  BP: 114/61 135/69    Pulse: 82     Resp: 17     Temp: 97.7 F (36.5 C)     TempSrc: Axillary     SpO2: 94%  98% 98%  Weight:      Height:        Intake/Output Summary (Last 24 hours) at 08/19/2017 1258 Last data filed at 08/19/2017 0941 Gross per 24 hour  Intake 1777.92 ml  Output 150 ml  Net 1627.92 ml   Filed Weights   08/18/17 1907  Weight: 88.7 kg (195 lb 8.8 oz)    Examination:   General: Not in pain or dyspnea, deconditioned Neurology: Awake and alert, non focal  E ENT: no pallor, no icterus, oral mucosa moist Cardiovascular: No JVD. S1-S2 present,  rhythmic, no gallops, rubs, or murmurs. No lower extremity edema. Pulmonary: decreased breath sounds bilaterally, poor air movement, no wheezing, rhonchi or rales. Gastrointestinal. Abdomen protuberant no organomegaly, non tender, no rebound or guarding Skin. No rashes Musculoskeletal: no joint  deformities     Data Reviewed: I have personally reviewed following labs and imaging studies  CBC: Recent Labs  Lab 08/18/17 1230 08/19/17 0423  WBC 13.1* 10.1  NEUTROABS 10.7*  --   HGB 13.9 12.3*  HCT 41.0 37.5*  MCV 88.4 91.0  PLT 301 295   Basic Metabolic Panel: Recent Labs  Lab 08/18/17 1230 08/19/17 0423  NA 143 142  K 3.0* 3.5  CL 101 104  CO2 26 28  GLUCOSE 112* 110*  BUN 32* 25*  CREATININE 0.98 0.81  CALCIUM 9.0 8.5*   GFR: Estimated Creatinine Clearance: 82.4 mL/min (by C-G formula based on SCr of 0.81 mg/dL). Liver Function Tests: Recent Labs  Lab 08/18/17 1230  AST 28  ALT 20  ALKPHOS 67  BILITOT 0.6  PROT 6.7  ALBUMIN 3.2*   No results for input(s): LIPASE, AMYLASE in the last 168 hours. No results for input(s): AMMONIA in the last 168 hours. Coagulation Profile: No results for input(s): INR, PROTIME in the last 168 hours. Cardiac Enzymes: Recent Labs  Lab 08/18/17 1230  TROPONINI <0.03   BNP (last 3 results) Recent Labs    12/26/16 1208  PROBNP 86.0   HbA1C: No results for input(s): HGBA1C in the last 72 hours. CBG: Recent Labs  Lab 08/18/17 1303 08/19/17 0757  GLUCAP 93 97   Lipid Profile: No results for input(s): CHOL, HDL, LDLCALC, TRIG, CHOLHDL, LDLDIRECT in the last 72 hours. Thyroid Function Tests: No results for input(s): TSH, T4TOTAL, FREET4, T3FREE, THYROIDAB in the last 72 hours. Anemia Panel: No results for input(s): VITAMINB12, FOLATE, FERRITIN, TIBC, IRON, RETICCTPCT in the last 72 hours.    Radiology Studies: I have reviewed all of the imaging during this hospital visit personally     Scheduled Meds: . allopurinol  300 mg Oral Daily  . calcium-vitamin D  1 tablet Oral Daily  . carbidopa-levodopa  1 tablet Oral 3 times per day  . cholecalciferol  1,000 Units Oral Daily  . cloNIDine  0.1 mg Oral BID  . colchicine  0.6 mg Oral Daily  . enoxaparin (LOVENOX) injection  40 mg Subcutaneous Q24H  .  fluticasone furoate-vilanterol  1 puff Inhalation Daily  . losartan  100 mg Oral Daily   And  . hydrochlorothiazide  25 mg Oral Daily  . latanoprost  1 drop Both Eyes QHS  . levothyroxine  200 mcg Oral QAC breakfast  . metoprolol tartrate  50 mg Oral BID  . omega-3 acid ethyl esters  3 g Oral Daily  . pantoprazole  40 mg Oral QPM  . rOPINIRole  1 mg Oral Daily   And  . rOPINIRole  4 mg Oral QHS  . simvastatin  10 mg Oral q1800  . umeclidinium bromide  1 puff Inhalation Daily   Continuous Infusions: . 0.9 % NaCl with KCl 40 mEq / L 125 mL/hr (08/19/17 0501)  . ceFEPime (MAXIPIME) IV Stopped (08/19/17 1022)  . vancomycin       LOS: 1 day        Tawni Millers, MD [facility] Pager [phone]

## 2017-08-19 NOTE — Progress Notes (Signed)
Critical lab value MRSA PCR positive. Positive MRSA PCR  orders initiated per protocol.

## 2017-08-19 NOTE — Progress Notes (Signed)
PHARMACY - PHYSICIAN COMMUNICATION CRITICAL VALUE ALERT - BLOOD CULTURE IDENTIFICATION (BCID)  [name] is an 77 y.o. male who presented to [facility] on 08/18/2017 with a chief complaint of Shortness of breath, Hypoxia  Assessment:  BCID 1/4 samples with Staph species, methicillin resistance detected, MRSA PCR is +  Name of physician (or Provider) Contacted: Arrien  Current antibiotics:  Vancomycin & Cefepime begun 3/2, discontinued 3/3 > Azithromycin 500mg  po daily  Changes to prescribed antibiotics recommended:  None  Results for orders placed or performed during the hospital encounter of 08/18/17  Blood Culture ID Panel (Reflexed) (Collected: 08/18/2017  3:47 PM)  Result Value Ref Range   Enterococcus species NOT DETECTED NOT DETECTED   Listeria monocytogenes NOT DETECTED NOT DETECTED   Staphylococcus species DETECTED (A) NOT DETECTED   Staphylococcus aureus NOT DETECTED NOT DETECTED   Methicillin resistance DETECTED (A) NOT DETECTED   Streptococcus species NOT DETECTED NOT DETECTED   Streptococcus agalactiae NOT DETECTED NOT DETECTED   Streptococcus pneumoniae NOT DETECTED NOT DETECTED   Streptococcus pyogenes NOT DETECTED NOT DETECTED   Acinetobacter baumannii NOT DETECTED NOT DETECTED   Enterobacteriaceae species NOT DETECTED NOT DETECTED   Enterobacter cloacae complex NOT DETECTED NOT DETECTED   Escherichia coli NOT DETECTED NOT DETECTED   Klebsiella oxytoca NOT DETECTED NOT DETECTED   Klebsiella pneumoniae NOT DETECTED NOT DETECTED   Proteus species NOT DETECTED NOT DETECTED   Serratia marcescens NOT DETECTED NOT DETECTED   Haemophilus influenzae NOT DETECTED NOT DETECTED   Neisseria meningitidis NOT DETECTED NOT DETECTED   Pseudomonas aeruginosa NOT DETECTED NOT DETECTED   Candida albicans NOT DETECTED NOT DETECTED   Candida glabrata NOT DETECTED NOT DETECTED   Candida krusei NOT DETECTED NOT DETECTED   Candida parapsilosis NOT DETECTED NOT DETECTED   Candida  tropicalis NOT DETECTED NOT DETECTED    [name], [name] 08/19/2017  5:59 PM

## 2017-08-20 ENCOUNTER — Inpatient Hospital Stay (HOSPITAL_COMMUNITY): Payer: Medicare Other

## 2017-08-20 DIAGNOSIS — I351 Nonrheumatic aortic (valve) insufficiency: Secondary | ICD-10-CM

## 2017-08-20 DIAGNOSIS — G629 Polyneuropathy, unspecified: Secondary | ICD-10-CM

## 2017-08-20 LAB — BASIC METABOLIC PANEL
ANION GAP: 8 (ref 5–15)
BUN: 20 mg/dL (ref 6–20)
CALCIUM: 8.5 mg/dL — AB (ref 8.9–10.3)
CO2: 32 mmol/L (ref 22–32)
Chloride: 104 mmol/L (ref 101–111)
Creatinine, Ser: 0.79 mg/dL (ref 0.61–1.24)
GFR calc Af Amer: >60 mL/min (ref >60)
GLUCOSE: 101 mg/dL — AB (ref 65–99)
POTASSIUM: 3.6 mmol/L (ref 3.5–5.1)
Sodium: 144 mmol/L (ref 135–145)

## 2017-08-20 LAB — GLUCOSE, CAPILLARY: GLUCOSE-CAPILLARY: 151 mg/dL — AB (ref 65–99)

## 2017-08-20 LAB — ECHOCARDIOGRAM COMPLETE
HEIGHTINCHES: 67 in
WEIGHTICAEL: 3467.39 [oz_av]

## 2017-08-20 NOTE — Progress Notes (Signed)
  Echocardiogram 2D Echocardiogram has been performed.  Merrie Roof F 08/20/2017, 4:09 PM

## 2017-08-20 NOTE — Progress Notes (Signed)
PROGRESS NOTE    [name]  YKD:[id number] DOB: 06-24-1940 DOA: 08/18/2017 PCP: [name], MD    Brief Narrative:  77 year old male who presented with dyspnea.  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  He had progressive and worsening dyspnea, associated with oxygen desaturation down to 89% at home.  On the initial physical examination blood pressure 118/71, heart rate 96, respiratory rate 28, temperature 98.77F, oxygen saturation 88%.  Dry mucous membranes, lungs with coarse breath sounds bilaterally, no significant wheezing, heart S1-S2 present rhythmic tachycardic, abdomen soft nontender, no lower extremity edema.  Sodium 143, potassium 3.0, chloride 101, bicarb 26, glucose 112, BUN 32, creatinine 0.8, white count 13.1, hemoglobin 13.9, hematocrit 41.0, platelets 301.  Chest x-ray hyperinflated, left rotation, increased hilar vascular congestion.  Chest CT negative for pulmonary embolism, left basilar atelectasis with a small pleural effusion, small apical bilateral atelectasis.  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  EKG sinus rhythm, left axis deviation, left anterior fascicular block.   Patient was admitted to the hospital working diagnosis of acute hypoxic respiratory failure due to COPD exacerbation.   Assessment & Plan:   Principal Problem:   Acute respiratory failure with hypoxemia (HCC) Active Problems:   Sleep apnea with use of continuous positive airway pressure (CPAP)   Obesity (BMI 30-39.9)   RLS (restless legs syndrome)   Primary gout   Dyspnea   Muscle atrophy of lower extremity   Bowel and bladder incontinence   Neuropathy   Hypokalemia   Leukocytosis  1. Acute hypoxic respiratory failure due to COPD exacerbation. Clinically improving, improved with bronchodilator, continue  supplemental -02 per [name] and systemic steroids. On inhaled corticosteroids and long acting insulin. Continue azithromycin for 5 days. Patient is non ambulatory.  2. Coagulase negative MRSA bacteremia. Had 2 bottles positive, will repeat cultures and will check on transthoracic echocardiogram. On IV vancomycin for now. Patient with no vascular devices. Had left knew replaced in the past, but no signs of local infection. Chest imaging with no pneumonic infiltrate. No skin wounds.   2. HTN.  On clonidine, losartan/ hctz and metoprolol. For blood pressure control.   3. Dyslipidemia. On simvastatin  4. Parkinson's. On carbidopa and ropinirol.   5. Levothyroxine. On levothyroxine.     DVT prophylaxis: enoxaparin  Code Status:  full Family Communication: No family at the bedside Disposition Plan: home   Consultants:     Procedures:     Antimicrobials:   azithromycin    Subjective: Patient deconditioned, but feeling better, dyspnea improved with bronchodilator, no nausea or vomiting, no chest pain.   Objective: Vitals:   08/20/17 0558 08/20/17 0702 08/20/17 0856 08/20/17 0942  BP: (!) 152/79   (!) 171/81  Pulse: 70   86  Resp: 16     Temp: 98.7 F (37.1 C)     TempSrc: Axillary     SpO2: 96%  96% 96%  Weight:  98.3 kg (216 lb 11.4 oz)    Height:        Intake/Output Summary (Last 24 hours) at 08/20/2017 1200 Last data filed at 08/20/2017 1015 Gross per 24 hour  Intake 338.9 ml  Output 1400 ml  Net -1061.1 ml   Filed Weights   08/18/17 1907 08/20/17 0702  Weight: 88.7 kg (195 lb 8.8 oz) 98.3 kg (216 lb 11.4 oz)    Examination:  General: Not in pain or dyspnea, deconditioned Neurology: Awake and alert, non focal  E ENT: no pallor, no icterus, oral mucosa moist Cardiovascular: No JVD. S1-S2 present, rhythmic, no gallops, rubs, or murmurs. No lower extremity edema. Pulmonary: decreased breath sounds bilaterally with prolonged expiratory phase, no  wheezing, scattered rhonchi and rales. Gastrointestinal. Abdomen protuberant, no organomegaly, non tender, no rebound or guarding Skin. No rashes Musculoskeletal: no joint deformities     Data Reviewed: I have personally reviewed following labs and imaging studies  CBC: Recent Labs  Lab 08/18/17 1230 08/19/17 0423  WBC 13.1* 10.1  NEUTROABS 10.7*  --   HGB 13.9 12.3*  HCT 41.0 37.5*  MCV 88.4 91.0  PLT 301 188   Basic Metabolic Panel: Recent Labs  Lab 08/18/17 1230 08/19/17 0423 08/20/17 0512  NA 143 142 144  K 3.0* 3.5 3.6  CL 101 104 104  CO2 26 28 32  GLUCOSE 112* 110* 101*  BUN 32* 25* 20  CREATININE 0.98 0.81 0.79  CALCIUM 9.0 8.5* 8.5*   GFR: Estimated Creatinine Clearance: 87.8 mL/min (by C-G formula based on SCr of 0.79 mg/dL). Liver Function Tests: Recent Labs  Lab 08/18/17 1230  AST 28  ALT 20  ALKPHOS 67  BILITOT 0.6  PROT 6.7  ALBUMIN 3.2*   No results for input(s): LIPASE, AMYLASE in the last 168 hours. No results for input(s): AMMONIA in the last 168 hours. Coagulation Profile: No results for input(s): INR, PROTIME in the last 168 hours. Cardiac Enzymes: Recent Labs  Lab 08/18/17 1230  TROPONINI <0.03   BNP (last 3 results) Recent Labs    12/26/16 1208  PROBNP 86.0   HbA1C: No results for input(s): HGBA1C in the last 72 hours. CBG: Recent Labs  Lab 08/18/17 1303 08/19/17 0757 08/20/17 0840  GLUCAP 93 97 151*   Lipid Profile: No results for input(s): CHOL, HDL, LDLCALC, TRIG, CHOLHDL, LDLDIRECT in the last 72 hours. Thyroid Function Tests: No results for input(s): TSH, T4TOTAL, FREET4, T3FREE, THYROIDAB in the last 72 hours. Anemia Panel: No results for input(s): VITAMINB12, FOLATE, FERRITIN, TIBC, IRON, RETICCTPCT in the last 72 hours.    Radiology Studies: I have reviewed all of the imaging during this hospital visit personally     Scheduled Meds: . allopurinol  300 mg Oral Daily  . azithromycin  500 mg Oral  Daily  . calcium-vitamin D  1 tablet Oral Daily  . carbidopa-levodopa  1 tablet Oral 3 times per day  . Chlorhexidine Gluconate Cloth  6 each Topical Q0600  . cholecalciferol  1,000 Units Oral Daily  . cloNIDine  0.1 mg Oral BID  . colchicine  0.6 mg Oral Daily  . enoxaparin (LOVENOX) injection  40 mg Subcutaneous Q24H  . fluticasone furoate-vilanterol  1 puff Inhalation Daily  . losartan  100 mg Oral Daily   And  . hydrochlorothiazide  25 mg Oral Daily  . ipratropium-albuterol  3 mL Nebulization TID  . latanoprost  1 drop Both Eyes QHS  . levothyroxine  200 mcg Oral QAC breakfast  . methylPREDNISolone (SOLU-MEDROL) injection  40 mg Intravenous Daily  . metoprolol tartrate  50 mg Oral BID  . mupirocin ointment  1 application Nasal BID  . omega-3 acid ethyl esters  3 g Oral Daily  . pantoprazole  40 mg Oral QPM  . rOPINIRole  1 mg Oral Daily   And  . rOPINIRole  4 mg Oral QHS  . simvastatin  10 mg Oral q1800  .  umeclidinium bromide  1 puff Inhalation Daily   Continuous Infusions:   LOS: 2 days        Khaleelah Yowell Gerome Apley, MD [facility] Pager [phone]

## 2017-08-21 LAB — CBC WITH DIFFERENTIAL/PLATELET
Basophils Absolute: 0 10*3/uL (ref 0.0–0.1)
Basophils Relative: 0 %
EOS PCT: 2 %
Eosinophils Absolute: 0.2 10*3/uL (ref 0.0–0.7)
HEMATOCRIT: 36.6 % — AB (ref 39.0–52.0)
Hemoglobin: 12 g/dL — ABNORMAL LOW (ref 13.0–17.0)
LYMPHS PCT: 29 %
Lymphs Abs: 2.7 10*3/uL (ref 0.7–4.0)
MCH: 29.4 pg (ref 26.0–34.0)
MCHC: 32.8 g/dL (ref 30.0–36.0)
MCV: 89.7 fL (ref 78.0–100.0)
MONO ABS: 0.8 10*3/uL (ref 0.1–1.0)
MONOS PCT: 8 %
NEUTROS ABS: 5.6 10*3/uL (ref 1.7–7.7)
Neutrophils Relative %: 61 %
Platelets: 333 10*3/uL (ref 150–400)
RBC: 4.08 MIL/uL — ABNORMAL LOW (ref 4.22–5.81)
RDW: 14.5 % (ref 11.5–15.5)
WBC: 9.2 10*3/uL (ref 4.0–10.5)

## 2017-08-21 LAB — BASIC METABOLIC PANEL
Anion gap: 11 (ref 5–15)
BUN: 19 mg/dL (ref 6–20)
CALCIUM: 8.4 mg/dL — AB (ref 8.9–10.3)
CO2: 30 mmol/L (ref 22–32)
CREATININE: 0.83 mg/dL (ref 0.61–1.24)
Chloride: 101 mmol/L (ref 101–111)
GFR calc Af Amer: >60 mL/min (ref >60)
GFR calc non Af Amer: >60 mL/min (ref >60)
GLUCOSE: 93 mg/dL (ref 65–99)
Potassium: 3.1 mmol/L — ABNORMAL LOW (ref 3.5–5.1)
Sodium: 142 mmol/L (ref 135–145)

## 2017-08-21 LAB — CULTURE, BLOOD (ROUTINE X 2)
SPECIAL REQUESTS: ADEQUATE
Special Requests: ADEQUATE

## 2017-08-21 LAB — GLUCOSE, CAPILLARY: Glucose-Capillary: 101 mg/dL — ABNORMAL HIGH (ref 65–99)

## 2017-08-21 MED ORDER — IPRATROPIUM-ALBUTEROL 0.5-2.5 (3) MG/3ML IN SOLN
3.0000 mL | Freq: Two times a day (BID) | RESPIRATORY_TRACT | Status: DC
  Administered 2017-08-21 – 2017-08-23 (×4): 3 mL via RESPIRATORY_TRACT
  Filled 2017-08-21 (×4): qty 3

## 2017-08-21 MED ORDER — POTASSIUM CHLORIDE CRYS ER 20 MEQ PO TBCR
40.0000 meq | EXTENDED_RELEASE_TABLET | Freq: Once | ORAL | Status: AC
  Administered 2017-08-21: 40 meq via ORAL
  Filled 2017-08-21: qty 2

## 2017-08-21 NOTE — Evaluation (Signed)
Occupational Therapy Evaluation Patient Details Name: [name] MRN: [id number] DOB: June 25, 1940 Today's Date: 08/21/2017    History of Present Illness 77 year old male who presented with dyspnea.  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    Clinical Impression   Pt admitted with dyspnea. Pt currently with functional limitations due to the deficits listed below (see OT Problem List).  Pt will benefit from skilled OT to increase their safety and independence with ADL and functional mobility for ADL to facilitate discharge to venue listed below.     Follow Up Recommendations  SNF    Equipment Recommendations  None recommended by OT    Recommendations for Other Services       Precautions / Restrictions Precautions Precautions: Fall Restrictions Weight Bearing Restrictions: No      Mobility Bed Mobility Overal bed mobility: Needs Assistance Bed Mobility: Supine to Sit     Supine to sit: Mod assist     General bed mobility comments: pt sat EOB for 10 min   Transfers                 General transfer comment: NT- pt using H[facility] at home    Balance Overall balance assessment: Needs assistance Sitting-balance support: Bilateral upper extremity supported;Feet supported Sitting balance-Leahy Scale: Poor Sitting balance - Comments: requires BUE support on bedrails for static sitting, flexed trunk and forward head posture                                   ADL either performed or assessed with clinical judgement   ADL Overall ADL's : Needs assistance/impaired Eating/Feeding: Set up;Bed level   Grooming: Minimal assistance;Sitting   Upper Body Bathing: Moderate assistance;Sitting   Lower Body Bathing: Total assistance;Sitting/lateral leans;Cueing for  sequencing;Cueing for safety   Upper Body Dressing : Moderate assistance;Sitting   Lower Body Dressing: Total assistance;Sitting/lateral leans;Cueing for sequencing;Cueing for safety       Toileting- Clothing Manipulation and Hygiene: Maximal assistance Toileting - Clothing Manipulation Details (indicate cue type and reason): bed pan placement- rolling in bed             Vision         Perception     Praxis      Pertinent Vitals/Pain Pain Score: 4  Pain Location: back  Pain Descriptors / Indicators: Aching Pain Intervention(s): Limited activity within patient's tolerance;Repositioned;Monitored during session     Hand Dominance Right   Extremity/Trunk Assessment     Lower Extremity Assessment RLE Deficits / Details: hip/knee in flexed position in bed upon my arrival, pt stated "it does that",  DF/PF ankle 0/5, knee ext 0/5 LLE Deficits / Details: knee ext -4/5       Communication Communication Communication: No difficulties   Cognition Arousal/Alertness: Awake/alert Behavior During Therapy: WFL for tasks assessed/performed Overall Cognitive Status: Within Functional Limits for tasks assessed                                                Home Living Family/patient expects to be discharged to:: Skilled nursing facility Living Arrangements: Spouse/significant other Available Help at Discharge: Family;Available 24 hours/day Type  of Home: House Home Access: Ramped entrance(has temporary ramp)     Home Layout: One level     Bathroom Shower/Tub: Occupational psychologist: Handicapped height     Home Equipment: [place] - single point;Walker - 4 wheels;Walker - 2 wheels;Other (comment)(hoyer)          Prior Functioning/Environment Level of Independence: Needs assistance  Gait / Transfers Assistance Needed: Harrel Lemon to transfer to [facility], pt can propel WC; son comes over to lift pt into car for Dr appointments ADL's / Homemaking  Assistance Needed: wife assists pt to spong bathe   Comments: pt was ambulatory until UTI late December, went to SNF after hospitalization for that. Progressive decline with RLE weakness. Has been using Harrel Lemon since he came home early Feb.         OT Problem List: Decreased strength;Decreased activity tolerance;Decreased knowledge of use of DME or AE;Impaired balance (sitting and/or standing)      OT Treatment/Interventions: Self-care/ADL training;Patient/family education;DME and/or AE instruction    OT Goals(Current goals can be found in the care plan section) Acute Rehab OT Goals Patient Stated Goal: to be able to stand so he can assist with transfers, be able to get into a car OT Goal Formulation: With patient Time For Goal Achievement: 09/04/17  OT Frequency: Min 2X/week   Barriers to D/C:            Co-evaluation              AM-PAC PT "6 Clicks" Daily Activity     Outcome Measure Help from another person eating meals?: A Little Help from another person taking care of personal grooming?: A Little Help from another person toileting, which includes using toliet, bedpan, or urinal?: Total Help from another person bathing (including washing, rinsing, drying)?: A Lot Help from another person to put on and taking off regular upper body clothing?: A Lot Help from another person to put on and taking off regular lower body clothing?: Total 6 Click Score: 12   End of Session    Activity Tolerance: Patient tolerated treatment well Patient left: in bed;with call bell/phone within reach;Other (comment)(on bed pan)  OT Visit Diagnosis: Unsteadiness on feet (R26.81);Muscle weakness (generalized) (M62.81)                Time: 8768-1157 OT Time Calculation (min): 30 min Charges:  OT General Charges $OT Visit: 1 Visit OT Evaluation $OT Eval Moderate Complexity: 1 Mod OT Treatments $Self Care/Home Management : 8-22 mins G-Codes:     Kari Baars, OT [phone]  Payton Mccallum D 08/21/2017, 10:05 AM

## 2017-08-21 NOTE — NC FL2 (Signed)
[place] LEVEL OF CARE SCREENING TOOL     IDENTIFICATION  Patient Name: [name] Birthdate: May 27, 1941 Sex: male Admission Date (Current Location): 08/18/2017  [facility] and [place] Number:  Herbalist and Address:  [facility],  [place], Portal      Provider Number: [id number]  Attending Physician Name and Address:  Tawni Millers  Relative Name and Phone Number:  [name], spouse, [phone]    Current Level of Care: SNF Recommended Level of Care: [place] Prior Approval Number:    Date Approved/Denied:   PASRR Number: [id number]  Discharge Plan: SNF    Current Diagnoses: Patient Active Problem List   Diagnosis Date Noted  . Acute respiratory failure with hypoxemia ([place]) 08/18/2017  . Hypokalemia 08/18/2017  . Leukocytosis 08/18/2017  . Muscle atrophy of lower extremity 08/09/2017  . Bowel and bladder incontinence 08/09/2017  . Right-sided muscle weakness 08/09/2017  . Neuropathy 08/09/2017  . 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/2017  . Abnormality of gait 07/12/2017  . Myoclonic jerkings, 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/2017  . Acquired right foot drop 07/12/2017  . UTI (urinary tract infection) due to Enterococcus 07/12/2017  . Pressure injury of skin 06/21/2017  . Acute cystitis 06/20/2017  . Generalized weakness 06/20/2017  . Dehydration 06/20/2017  . Dyspnea 12/26/2016  . Chronic cough 12/26/2016  . Hypersomnia with sleep apnea 10/30/2016  . CSA (central sleep apnea) 10/30/2016  . Wheezing symptom 10/30/2016  . CPAP/BiPAP dependent 07/01/2015  . 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 RLS (restless legs syndrome) 08/25/2014  . Primary gout 08/25/2014  . OSA on CPAP 08/25/2014  . Severe obesity (BMI >= 40) ([place]) 08/25/2014  . Obesity (BMI 30-39.9) 02/18/2013  . Sleep apnea with use of continuous positive airway pressure (CPAP)   . Occlusion and stenosis of  carotid artery without mention of cerebral infarction 11/29/2012  . S/P TKR (total knee replacement) 11/29/2012  . History of thyroid cancer     Orientation RESPIRATION BLADDER Height & Weight     Self, Time, Situation, Place  Normal Continent Weight: 216 lb 11.4 oz (98.3 kg) Height:  5\' 7"  (170.2 cm)  BEHAVIORAL SYMPTOMS/MOOD NEUROLOGICAL BOWEL NUTRITION STATUS      Continent Diet(Heart Healthy )  AMBULATORY STATUS COMMUNICATION OF NEEDS Skin   Extensive Assist Verbally Normal                       Personal Care Assistance Level of Assistance  Bathing, Feeding, Dressing Bathing Assistance: Limited assistance Feeding assistance: Independent Dressing Assistance: Limited assistance     Functional Limitations Info  Sight, Hearing, Speech Sight Info: Adequate Hearing Info: Adequate Speech Info: Adequate    SPECIAL CARE FACTORS FREQUENCY  PT (By licensed PT), OT (By licensed OT)     PT Frequency: 5x/week  OT Frequency: 5x/week            Contractures Contractures Info: Not present    Additional Factors Info  Code Status, Allergies, Psychotropic Code Status Info: Fullcode Allergies Info: Allergies: No Known Allergies           Current Medications (08/21/2017):  This is the current hospital active medication list Current Facility-Administered Medications  Medication Dose Route Frequency Provider Last Rate Last Dose  . acetaminophen (TYLENOL) tablet 650 mg  650 mg Oral Q6H PRN Lady Deutscher, MD       Or  . acetaminophen (TYLENOL) suppository 650 mg  650 mg Rectal Q6H PRN Lady Deutscher, MD      . allopurinol (ZYLOPRIM) tablet 300 mg  300 mg Oral Daily Lady Deutscher, MD   300 mg at 08/21/17 1016  . azithromycin (ZITHROMAX) tablet 500 mg  500 mg Oral Daily Arrien, Jimmy Picket, MD   500 mg at 08/21/17 1017  . calcium-vitamin D (OSCAL WITH D) 500-200 MG-UNIT per tablet 1 tablet  1 tablet Oral Daily Lady Deutscher, MD   1 tablet at 08/21/17 1017   . carbidopa-levodopa (SINEMET IR) 10-100 MG per tablet immediate release 1 tablet  1 tablet Oral 3 times per day Lady Deutscher, MD   1 tablet at 08/21/17 1307  . Chlorhexidine Gluconate Cloth 2 % PADS 6 each  6 each Topical Q0600 Arrien, Jimmy Picket, MD   6 each at 08/21/17 [phone]  . cholecalciferol (VITAMIN D) tablet 1,000 Units  1,000 Units Oral Daily Lady Deutscher, MD   1,000 Units at 08/21/17 1016  . cloNIDine (CATAPRES) tablet 0.1 mg  0.1 mg Oral BID Lady Deutscher, MD   0.1 mg at 08/21/17 1016  . colchicine tablet 0.6 mg  0.6 mg Oral Daily Lady Deutscher, MD   0.6 mg at 08/21/17 1016  . enoxaparin (LOVENOX) injection 40 mg  40 mg Subcutaneous Q24H Lady Deutscher, MD   40 mg at 08/20/17 2346  . fluticasone furoate-vilanterol (BREO ELLIPTA) 100-25 MCG/INH 1 puff  1 puff Inhalation Daily Lady Deutscher, MD   1 puff at 08/21/17 1124  . guaiFENesin tablet 400 mg  400 mg Oral BID PRN Lady Deutscher, MD      . losartan (COZAAR) tablet 100 mg  100 mg Oral Daily Minda Ditto, RPH   100 mg at 08/21/17 1016   And  . hydrochlorothiazide (HYDRODIURIL) tablet 25 mg  25 mg Oral Daily Green, Terri L, RPH   25 mg at 08/21/17 1016  . HYDROcodone-acetaminophen (NORCO/VICODIN) 5-325 MG per tablet 1 tablet  1 tablet Oral TID PRN Arrien, Jimmy Picket, MD   1 tablet at 08/21/17 1017  . ipratropium-albuterol (DUONEB) 0.5-2.5 (3) MG/3ML nebulizer solution 3 mL  3 mL Nebulization Q2H PRN Arrien, Jimmy Picket, MD      . ipratropium-albuterol (DUONEB) 0.5-2.5 (3) MG/3ML nebulizer solution 3 mL  3 mL Nebulization BID Arrien, Jimmy Picket, MD      . latanoprost (XALATAN) 0.005 % ophthalmic solution 1 drop  1 drop Both Eyes QHS Lady Deutscher, MD   1 drop at 08/21/17 0257  . levothyroxine (SYNTHROID, LEVOTHROID) tablet 200 mcg  200 mcg Oral QAC breakfast Lady Deutscher, MD   200 mcg at 08/21/17 0740  . methylPREDNISolone sodium succinate (SOLU-MEDROL) 40 mg/mL injection 40  mg  40 mg Intravenous Daily Arrien, Jimmy Picket, MD   40 mg at 08/21/17 1017  . metoprolol tartrate (LOPRESSOR) tablet 50 mg  50 mg Oral BID Lady Deutscher, MD   50 mg at 08/21/17 1016  . mupirocin ointment (BACTROBAN) 2 % 1 application  1 application Nasal BID Arrien, Jimmy Picket, MD   1 application at 14/43/15 1017  . omega-3 acid ethyl esters (LOVAZA) capsule 3 g  3 g Oral Daily Lady Deutscher, MD   3 g at 08/21/17 1016  . ondansetron (ZOFRAN) tablet 4 mg  4 mg Oral Q6H PRN Lady Deutscher, MD       Or  . ondansetron [facility]) injection 4 mg  4 mg Intravenous  Q6H PRN Lady Deutscher, MD      . pantoprazole (PROTONIX) EC tablet 40 mg  40 mg Oral QPM Lady Deutscher, MD   40 mg at 08/20/17 1700  . polyethylene glycol (MIRALAX / GLYCOLAX) packet 17 g  17 g Oral Daily PRN Lady Deutscher, MD      . rOPINIRole (REQUIP) tablet 1 mg  1 mg Oral Daily Arrien, Jimmy Picket, MD   1 mg at 08/21/17 1017   And  . rOPINIRole (REQUIP) tablet 4 mg  4 mg Oral QHS Arrien, Jimmy Picket, MD   4 mg at 08/20/17 2344  . simvastatin (ZOCOR) tablet 10 mg  10 mg Oral q1800 Lady Deutscher, MD   10 mg at 08/20/17 1700  . umeclidinium bromide (INCRUSE ELLIPTA) 62.5 MCG/INH 1 puff  1 puff Inhalation Daily Minda Ditto, RPH   1 puff at 08/21/17 1124     Discharge Medications: Please see discharge summary for a list of discharge medications.  Relevant Imaging Results:  Relevant Lab Results:   Additional Information SSN: [place] Jenisis Harmsen, [place]

## 2017-08-21 NOTE — Progress Notes (Addendum)
PROGRESS NOTE    [name]  LKG:[id number] DOB: 02-17-1941 DOA: 08/18/2017 PCP: [name], MD    Brief Narrative:  77 year old male who presented with dyspnea. 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 He had progressive and worsening dyspnea, associated with oxygen desaturation down to 89% at home. On the initial physical examination blood pressure 118/71, heart rate 96, respiratory rate 28, temperature 98.55F, oxygen saturation 88%.Dry mucous membranes, lungs with coarse breath sounds bilaterally, no significant wheezing, heart S1-S2 present rhythmic tachycardic, abdomen soft nontender, no lower extremity edema. 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  Patient was admitted to the hospital working diagnosis of acute hypoxic respiratory failure due to COPD exacerbation.   Assessment & Plan:   Principal Problem:   Acute respiratory failure with hypoxemia (HCC) Active Problems:   Sleep apnea with use of continuous positive airway pressure (CPAP)   Obesity (BMI 30-39.9)   RLS (restless legs syndrome)   Primary gout   Dyspnea   Muscle atrophy of lower extremity   Bowel and bladder incontinence   Neuropathy   Hypokalemia   Leukocytosis   1. Acute hypoxic respiratory failure due to COPD exacerbation. Continue improving, tolerating well bronchodilator therapy,  supplemental -02 per [place] to target 02 saturation greater than 92%. Continue systemic steroids. Inhaled corticosteroids and long acting b 2 agonist. 5 day course of Azithromycin. Physical therapy recommendations for SNF.   2. Staphylococcus epidermidis (methicillin resistant), positive blood cultures 2 / 2 bottles. No signs of systemic infection, echocardiogram with no vegetations, will follow on repeat blood cultures, if no growth and continue clinical improvement will assume contamination, will continue to hold on further antibiotic therapy.   2. HTN. Continue clonidine, losartan/ hctz and metoprolol with good toleration.   3. Dyslipidemia. Continue simvastatin  4. Parkinson's. Continue carbidopa and ropinirol. SNF at discharge  5. Levothyroxine. Continue levothyroxine with good toleration  6. Hypokalemia. Serum K correction with kcl. Renal function preserved with serum cr at 0.83 and K at 3.1. Follow renal panel in am.    DVT prophylaxis:enoxaparin Code Status:full Family Communication:No family at the bedside Disposition Plan:SNF in am if repeat blood cultures remain no growth.    Consultants:    Procedures:    Antimicrobials:  azithromycin   Subjective: Dyspnea continue to improve, no chest pain, no nausea or vomiting. Out of bed with physical therapy. Patient agrees in SNF.   Objective: Vitals:   08/21/17 0616 08/21/17 1014 08/21/17 1124 08/21/17 1126  BP: 112/66 132/87    Pulse: 63 76    Resp: 18 16    Temp: 97.9 F (36.6 C)     TempSrc: Oral     SpO2: 95% 92% 97% 97%  Weight:      Height:        Intake/Output Summary (Last 24 hours) at 08/21/2017 1217 Last data filed at 08/21/2017 1021 Gross per 24 hour  Intake 680 ml  Output 1500 ml  Net -820 ml   Filed Weights   08/18/17 1907 08/20/17 0702  Weight: 88.7  kg (195 lb 8.8 oz) 98.3 kg (216 lb 11.4 oz)    Examination:   General: Not in pain or dyspnea, deconditioned Neurology:  Awake and alert, non focal  E ENT: no pallor, no icterus, oral mucosa moist Cardiovascular: No JVD. S1-S2 present, rhythmic, no gallops, rubs, or murmurs. No lower extremity edema. Pulmonary: decreased breath sounds bilaterally, prolonged expiratory phase, no wheezing or rales. Scattered rhonchi.  Gastrointestinal. Abdomen protuberant, no organomegaly, non tender, no rebound or guarding Skin. No rashes Musculoskeletal: no joint deformities     Data Reviewed: I have personally reviewed following labs and imaging studies  CBC: Recent Labs  Lab 08/18/17 1230 08/19/17 0423 08/21/17 0538  WBC 13.1* 10.1 9.2  NEUTROABS 10.7*  --  5.6  HGB 13.9 12.3* 12.0*  HCT 41.0 37.5* 36.6*  MCV 88.4 91.0 89.7  PLT 301 297 086   Basic Metabolic Panel: Recent Labs  Lab 08/18/17 1230 08/19/17 0423 08/20/17 0512 08/21/17 0538  NA 143 142 144 142  K 3.0* 3.5 3.6 3.1*  CL 101 104 104 101  CO2 26 28 32 30  GLUCOSE 112* 110* 101* 93  BUN 32* 25* 20 19  CREATININE 0.98 0.81 0.79 0.83  CALCIUM 9.0 8.5* 8.5* 8.4*   GFR: Estimated Creatinine Clearance: 84.6 mL/min (by C-G formula based on SCr of 0.83 mg/dL). Liver Function Tests: Recent Labs  Lab 08/18/17 1230  AST 28  ALT 20  ALKPHOS 67  BILITOT 0.6  PROT 6.7  ALBUMIN 3.2*   No results for input(s): LIPASE, AMYLASE in the last 168 hours. No results for input(s): AMMONIA in the last 168 hours. Coagulation Profile: No results for input(s): INR, PROTIME in the last 168 hours. Cardiac Enzymes: Recent Labs  Lab 08/18/17 1230  TROPONINI <0.03   BNP (last 3 results) Recent Labs    12/26/16 1208  PROBNP 86.0   HbA1C: No results for input(s): HGBA1C in the last 72 hours. CBG: Recent Labs  Lab 08/18/17 1303 08/19/17 0757 08/20/17 0840 08/21/17 0742  GLUCAP 93 97 151* 101*   Lipid Profile: No results for input(s): CHOL, HDL, LDLCALC, TRIG, CHOLHDL, LDLDIRECT in the last 72 hours. Thyroid Function Tests: No results for  input(s): TSH, T4TOTAL, FREET4, T3FREE, THYROIDAB in the last 72 hours. Anemia Panel: No results for input(s): VITAMINB12, FOLATE, FERRITIN, TIBC, IRON, RETICCTPCT in the last 72 hours.    Radiology Studies: I have reviewed all of the imaging during this hospital visit personally     Scheduled Meds: . allopurinol  300 mg Oral Daily  . azithromycin  500 mg Oral Daily  . calcium-vitamin D  1 tablet Oral Daily  . carbidopa-levodopa  1 tablet Oral 3 times per day  . Chlorhexidine Gluconate Cloth  6 each Topical Q0600  . cholecalciferol  1,000 Units Oral Daily  . cloNIDine  0.1 mg Oral BID  . colchicine  0.6 mg Oral Daily  . enoxaparin (LOVENOX) injection  40 mg Subcutaneous Q24H  . fluticasone furoate-vilanterol  1 puff Inhalation Daily  . losartan  100 mg Oral Daily   And  . hydrochlorothiazide  25 mg Oral Daily  . ipratropium-albuterol  3 mL Nebulization BID  . latanoprost  1 drop Both Eyes QHS  . levothyroxine  200 mcg Oral QAC breakfast  . methylPREDNISolone (SOLU-MEDROL) injection  40 mg Intravenous Daily  . metoprolol tartrate  50 mg Oral BID  . mupirocin ointment  1 application Nasal BID  . omega-3 acid ethyl esters  3 g Oral  Daily  . pantoprazole  40 mg Oral QPM  . rOPINIRole  1 mg Oral Daily   And  . rOPINIRole  4 mg Oral QHS  . simvastatin  10 mg Oral q1800  . umeclidinium bromide  1 puff Inhalation Daily   Continuous Infusions:   LOS: 3 days        Mauricio Gerome Apley, MD [facility] Pager [phone]

## 2017-08-21 NOTE — Care Management Important Message (Signed)
Important Message  Patient Details IM Letter given to [name]/Case Manager to present to the Patient Name: [name] MRN: [id number] Date of Birth: [birth year]   Medicare Important Message Given:  Yes    Kerin Salen 08/21/2017, 10:57 AM

## 2017-08-22 ENCOUNTER — Telehealth: Payer: Self-pay | Admitting: Neurology

## 2017-08-22 DIAGNOSIS — J189 Pneumonia, unspecified organism: Secondary | ICD-10-CM

## 2017-08-22 DIAGNOSIS — R159 Full incontinence of feces: Secondary | ICD-10-CM

## 2017-08-22 DIAGNOSIS — R32 Unspecified urinary incontinence: Secondary | ICD-10-CM

## 2017-08-22 LAB — BASIC METABOLIC PANEL
ANION GAP: 12 (ref 5–15)
BUN: 19 mg/dL (ref 6–20)
CALCIUM: 8.3 mg/dL — AB (ref 8.9–10.3)
CO2: 29 mmol/L (ref 22–32)
Chloride: 101 mmol/L (ref 101–111)
Creatinine, Ser: 0.79 mg/dL (ref 0.61–1.24)
Glucose, Bld: 101 mg/dL — ABNORMAL HIGH (ref 65–99)
POTASSIUM: 3.1 mmol/L — AB (ref 3.5–5.1)
Sodium: 142 mmol/L (ref 135–145)

## 2017-08-22 LAB — MAGNESIUM: Magnesium: 1.9 mg/dL (ref 1.7–2.4)

## 2017-08-22 MED ORDER — PREDNISONE 50 MG PO TABS
50.0000 mg | ORAL_TABLET | Freq: Every day | ORAL | Status: DC
  Administered 2017-08-23: 50 mg via ORAL
  Filled 2017-08-22: qty 1

## 2017-08-22 MED ORDER — POTASSIUM CHLORIDE CRYS ER 20 MEQ PO TBCR
40.0000 meq | EXTENDED_RELEASE_TABLET | ORAL | Status: AC
  Administered 2017-08-22 (×2): 40 meq via ORAL
  Filled 2017-08-22 (×2): qty 2

## 2017-08-22 NOTE — Clinical Social Work Note (Signed)
Clinical Social Work Assessment  Patient Details  Name: [name] MRN: [id number] Date of Birth: 1941/06/19  Date of referral:  08/21/17               Reason for consult:  Facility Placement                Permission sought to share information with:  Family Supports Permission granted to share information::  Yes, Verbal Permission Granted  Name::        Agency::  SNF  Relationship::  Spouse  Contact Information:     Housing/Transportation Living arrangements for the past 2 months:  Single Family Home Source of Information:  Patient Patient Interpreter Needed:  None Criminal Activity/Legal Involvement Pertinent to Current Situation/Hospitalization:  No - Comment as needed Significant Relationships:  Spouse Lives with:  Spouse Do you feel safe going back to the place where you live?  No Need for family participation in patient care:  Yes (Comment)  Care giving concerns:   Shortness of Breath and Weakness   Social Worker assessment / plan:  CSW met with patient at bedside, explained role and reason for visit to assist with discharge planning to SNF. Patient reports last October he was up walking and using only a cane for assistance. Patient reports since early January he has been wheel chair bound and using a hoyer lift  with spouse assistance.   Patient is agreeable to complete rehab at SNF to regain his strength.  He went to rehab last year at [facility] and did well with PT before going home.  Patient information sent to SNF's in area. No beds available at [facility]. Patient agreeable to [facility].  BCBS Medicare Authorization started.    Plan: SNF  Employment status:  Retired Nurse, adult PT Recommendations:  [place] / Referral to community resources:  [place]  Patient/Family's Response to care: Agreeable and Responding well.   Patient/Family's Understanding of and Emotional Response  to Diagnosis, Current Treatment, and Prognosis: Patient is knowledgeable of his diagnosis and treatment. Patient spouse also very involved in his care.         Emotional Assessment Appearance:  Appears stated age Attitude/Demeanor/Rapport:    Affect (typically observed):  Accepting Orientation:  Oriented to Self, Oriented to Place, Oriented to  Time, Oriented to Situation Alcohol / Substance use:  Not Applicable Psych involvement (Current and /or in the community):  No (Comment)  Discharge Needs  Concerns to be addressed:  Discharge Planning Concerns Readmission within the last 30 days:  No Current discharge risk:  Dependent with Mobility Barriers to Discharge:  Continued Medical Work up   [employer], LCSW 08/22/2017, 8:57 AM

## 2017-08-22 NOTE — Telephone Encounter (Signed)
I have also had our MRI scheduler reach out to verify that this is correct. We will await to see what the [facility] MRI personnel say.

## 2017-08-22 NOTE — Progress Notes (Signed)
CSW updated SNF the patient is not medically ready to transfer today.  CSW will have to call SNF in am to determine if bed is still available.  Insurance authorization-still pending.   Kathrin Greathouse, Latanya Presser, MSW Clinical Social Worker  [phone] 08/22/2017  2:00 PM

## 2017-08-22 NOTE — Progress Notes (Addendum)
[facility] PROGRESS NOTE    Progress Note  [name]  SKA:[id number] DOB: 1940/09/11 DOA: 08/18/2017 PCP: [name], MD     Brief Narrative:   [name] is an 77 y.o. 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   Assessment/Plan:   Acute respiratory failure with hypoxia due to COPD exacerbation: Continue systemic oral steroids, inhalers and antibiotics. Physical therapy evaluated the patient recommended skilled nursing facility.  2 out of 2 blood cultures grew staph epidermidis: Appears to have no signs of infection, 2D echo showed no vegetation repeated blood cultures are pending, today is day 2, if blood cultures continue to be negative, he can be discharged in the morning.  We will continue to hold antibiotics.  Essential hypertension: Tolerating them well no changes were made.  Dyslipidemia: Continue statins.  Parkinson syndrome: Continue carbidopa and droperidol. Will go to skilled nursing facility.  Hypothyroidism: Continue Synthroid.   DVT prophylaxis: lovenox Family Communication:none Disposition Plan/Barrier to D/C: Home in the morning if blood cultures continue to be negative. Code Status:     Code Status Orders  (From admission, onward)        Start     Ordered   08/18/17 1705  Full code  Continuous     08/18/17 1708    Code Status History    Date Active Date Inactive Code Status Order ID Comments User Context   06/20/2017 17:07 06/23/2017 18:37 DNR [id number]  Modena Jansky, MD ED        IV Access:    Peripheral IV   Procedures and diagnostic studies:   No results found.   Medical Consultants:    None.  Anti-Infectives:   None  Subjective:    [name] he feels much better no new  complaints.  Objective:    Vitals:   08/21/17 1653 08/21/17 2052 08/21/17 2149 08/22/17 0552  BP: (!) 141/78  (!) 150/74 130/73  Pulse: 77  93 65  Resp: 16  (!) 22 20  Temp: 97.8 F (36.6 C)  (!) 97.5 F (36.4 C) (!) 97.3 F (36.3 C)  TempSrc: Oral  Oral Oral  SpO2: 94% 90% 92% 94%  Weight:    92.4 kg (203 lb 11.3 oz)  Height:        Intake/Output Summary (Last 24 hours) at 08/22/2017 1108 Last data filed at 08/21/2017 2213 Gross per 24 hour  Intake 240 ml  Output 500 ml  Net -260 ml   Filed Weights   08/18/17 1907 08/20/17 0702 08/22/17 0552  Weight: 88.7 kg (195 lb 8.8 oz) 98.3 kg (216 lb 11.4 oz) 92.4 kg (203 lb 11.3 oz)    Exam: General exam: In no acute distress. Respiratory system: Good air movement and clear to auscultation. Cardiovascular system: S1 & S2 heard, RRR.  Gastrointestinal system: Abdomen is nondistended, soft and nontender.  Extremities: No pedal edema. Skin: No rashes, lesions or ulcers Psychiatry: Judgement and insight appear normal. Mood & affect appropriate.    Data Reviewed:    Labs: Basic Metabolic Panel: Recent Labs  Lab 08/18/17 1230 08/19/17 0423 08/20/17 0512 08/21/17 0538 08/22/17 0601  NA 143 142 144 142 142  K 3.0* 3.5 3.6 3.1* 3.1*  CL 101 104 104 101 101  CO2 26 28 32 30 29  GLUCOSE 112* 110* 101* 93  101*  BUN 32* 25* 20 19 19   CREATININE 0.98 0.81 0.79 0.83 0.79  CALCIUM 9.0 8.5* 8.5* 8.4* 8.3*  MG  --   --   --   --  1.9   GFR Estimated Creatinine Clearance: 85.1 mL/min (by C-G formula based on SCr of 0.79 mg/dL). Liver Function Tests: Recent Labs  Lab 08/18/17 1230  AST 28  ALT 20  ALKPHOS 67  BILITOT 0.6  PROT 6.7  ALBUMIN 3.2*   No results for input(s): LIPASE, AMYLASE in the last 168 hours. No results for input(s): AMMONIA in the last 168 hours. Coagulation profile No results for input(s): INR, PROTIME in the last 168 hours.  CBC: Recent Labs  Lab 08/18/17 1230 08/19/17 0423 08/21/17 0538  WBC  13.1* 10.1 9.2  NEUTROABS 10.7*  --  5.6  HGB 13.9 12.3* 12.0*  HCT 41.0 37.5* 36.6*  MCV 88.4 91.0 89.7  PLT 301 297 333   Cardiac Enzymes: Recent Labs  Lab 08/18/17 1230  TROPONINI <0.03   BNP (last 3 results) Recent Labs    12/26/16 1208  PROBNP 86.0   CBG: Recent Labs  Lab 08/18/17 1303 08/19/17 0757 08/20/17 0840 08/21/17 0742  GLUCAP 93 97 151* 101*   D-Dimer: No results for input(s): DDIMER in the last 72 hours. Hgb A1c: No results for input(s): HGBA1C in the last 72 hours. Lipid Profile: No results for input(s): CHOL, HDL, LDLCALC, TRIG, CHOLHDL, LDLDIRECT in the last 72 hours. Thyroid function studies: No results for input(s): TSH, T4TOTAL, T3FREE, THYROIDAB in the last 72 hours.  Invalid input(s): FREET3 Anemia work up: No results for input(s): VITAMINB12, FOLATE, FERRITIN, TIBC, IRON, RETICCTPCT in the last 72 hours. Sepsis Labs: Recent Labs  Lab 08/18/17 1230 08/19/17 0423 08/21/17 0538  WBC 13.1* 10.1 9.2   Microbiology Recent Results (from the past 240 hour(s))  Blood culture (routine x 2)     Status: Abnormal   Collection Time: 08/18/17  3:47 PM  Result Value Ref Range Status   Specimen Description   Final    BLOOD LEFT HAND Performed at [facility], [place] [address] [place], [place] [address]    Special Requests   Final    BOTTLES DRAWN AEROBIC AND ANAEROBIC Blood Culture adequate volume Performed at [facility] [address], [place], [place] [address]    Culture  Setup Time   Final    GRAM POSITIVE COCCI IN CLUSTERS IN BOTH AEROBIC AND ANAEROBIC BOTTLES CRITICAL RESULT CALLED TO, READ BACK BY AND VERIFIED WITH: TGREEN,PHARMD @1748  08/19/17 BY LHOWARD Performed at [facility], [place] [address], [place], [place] [address]    Culture STAPHYLOCOCCUS EPIDERMIDIS (A)  Final   Report Status 08/21/2017 FINAL  Final   Organism ID, Bacteria STAPHYLOCOCCUS EPIDERMIDIS  Final      Susceptibility    Staphylococcus epidermidis - MIC*    CIPROFLOXACIN >=8 RESISTANT Resistant     ERYTHROMYCIN <=0.25 SENSITIVE Sensitive     GENTAMICIN <=0.5 SENSITIVE Sensitive     OXACILLIN >=4 RESISTANT Resistant     TETRACYCLINE 2 SENSITIVE Sensitive     VANCOMYCIN 2 SENSITIVE Sensitive     TRIMETH/SULFA <=10 SENSITIVE Sensitive     CLINDAMYCIN <=0.25 SENSITIVE Sensitive     RIFAMPIN <=0.5 SENSITIVE Sensitive     Inducible Clindamycin NEGATIVE Sensitive     * STAPHYLOCOCCUS EPIDERMIDIS  Blood Culture ID Panel (Reflexed)     Status: Abnormal   Collection Time: 08/18/17  3:47  PM  Result Value Ref Range Status   Enterococcus species NOT DETECTED NOT DETECTED Final   Listeria monocytogenes NOT DETECTED NOT DETECTED Final   Staphylococcus species DETECTED (A) NOT DETECTED Final    Comment: Methicillin (oxacillin) resistant coagulase negative staphylococcus. Possible blood culture contaminant (unless isolated from more than one blood culture draw or clinical case suggests pathogenicity). No antibiotic treatment is indicated for blood  culture contaminants. CRITICAL RESULT CALLED TO, READ BACK BY AND VERIFIED WITH: TGREEN,PHARMD @1748  08/19/17 BY LHOWARD    Staphylococcus aureus NOT DETECTED NOT DETECTED Final   Methicillin resistance DETECTED (A) NOT DETECTED Final    Comment: CRITICAL RESULT CALLED TO, READ BACK BY AND VERIFIED WITH: TGREEN,PHARM @1743  08/19/17 BY LHOWARD    Streptococcus species NOT DETECTED NOT DETECTED Final   Streptococcus agalactiae NOT DETECTED NOT DETECTED Final   Streptococcus pneumoniae NOT DETECTED NOT DETECTED Final   Streptococcus pyogenes NOT DETECTED NOT DETECTED Final   Acinetobacter baumannii NOT DETECTED NOT DETECTED Final   Enterobacteriaceae species NOT DETECTED NOT DETECTED Final   Enterobacter cloacae complex NOT DETECTED NOT DETECTED Final   Escherichia coli NOT DETECTED NOT DETECTED Final   Klebsiella oxytoca NOT DETECTED NOT DETECTED Final   Klebsiella  pneumoniae NOT DETECTED NOT DETECTED Final   Proteus species NOT DETECTED NOT DETECTED Final   Serratia marcescens NOT DETECTED NOT DETECTED Final   Haemophilus influenzae NOT DETECTED NOT DETECTED Final   Neisseria meningitidis NOT DETECTED NOT DETECTED Final   Pseudomonas aeruginosa NOT DETECTED NOT DETECTED Final   Candida albicans NOT DETECTED NOT DETECTED Final   Candida glabrata NOT DETECTED NOT DETECTED Final   Candida krusei NOT DETECTED NOT DETECTED Final   Candida parapsilosis NOT DETECTED NOT DETECTED Final   Candida tropicalis NOT DETECTED NOT DETECTED Final    Comment: Performed at [facility], [place]. [address], [place], [place] [address]  Blood culture (routine x 2)     Status: Abnormal   Collection Time: 08/18/17  3:52 PM  Result Value Ref Range Status   Specimen Description   Final    BLOOD LEFT WRIST Performed at [facility] [address] [place], [place] [address]    Special Requests   Final    BOTTLES DRAWN AEROBIC AND ANAEROBIC Blood Culture adequate volume Performed at [facility] [address] [place], [place] [address]    Culture  Setup Time   Final    IN BOTH AEROBIC AND ANAEROBIC BOTTLES GRAM POSITIVE COCCI IN PAIRS IN CLUSTERS CRITICAL VALUE NOTED.  VALUE IS CONSISTENT WITH PREVIOUSLY REPORTED AND CALLED VALUE.    Culture (A)  Final    STAPHYLOCOCCUS EPIDERMIDIS SUSCEPTIBILITIES PERFORMED ON PREVIOUS CULTURE WITHIN THE LAST 5 DAYS. Performed at [facility], [place] [address], [place], [place] [address]    Report Status 08/21/2017 FINAL  Final  MRSA PCR Screening     Status: Abnormal   Collection Time: 08/19/17  8:06 AM  Result Value Ref Range Status   MRSA by PCR POSITIVE (A) NEGATIVE Final    Comment:        The GeneXpert MRSA Assay (FDA approved for NASAL specimens only), is one component of a comprehensive MRSA colonization surveillance program. It is not intended to diagnose  MRSA infection nor to guide or monitor treatment for MRSA infections. CRITICAL RESULT CALLED TO, READ BACK BY AND VERIFIED WITH: CHEEK,K AT [place] [id number] BY HOOKER,B Performed at [facility], [place]  [address] [place], [place] [address]   Culture, blood (routine x 2)     Status: None (Preliminary result)   Collection Time: 08/20/17 12:17 PM  Result Value Ref Range Status   Specimen Description   Final    BLOOD LEFT ARM Performed at [facility], [place] [address] [place], [place] [address]    Special Requests   Final    BOTTLES DRAWN AEROBIC ONLY Blood Culture adequate volume Performed at [facility] [address], [place], [place] [address]    Culture   Final    NO GROWTH < 24 HOURS Performed at [facility] [address] [place], [place] [address]    Report Status PENDING  Incomplete  Culture, blood (routine x 2)     Status: None (Preliminary result)   Collection Time: 08/20/17 12:17 PM  Result Value Ref Range Status   Specimen Description   Final    BLOOD RIGHT ARM Performed at [facility], [place] [address] [place], [place] [address]    Special Requests   Final    IN PEDIATRIC BOTTLE Blood Culture adequate volume Performed at [facility] [address] [place], [place] [address]    Culture   Final    NO GROWTH < 24 HOURS Performed at [facility] [address], [place], [place] [address]    Report Status PENDING  Incomplete     Medications:   . allopurinol  300 mg Oral Daily  . azithromycin  500 mg Oral Daily  . calcium-vitamin D  1 tablet Oral Daily  . carbidopa-levodopa  1 tablet Oral 3 times per day  . Chlorhexidine Gluconate Cloth  6 each Topical Q0600  . cholecalciferol  1,000 Units Oral Daily  . cloNIDine  0.1 mg Oral BID  . colchicine  0.6 mg Oral Daily  . enoxaparin (LOVENOX) injection  40 mg Subcutaneous Q24H  . fluticasone furoate-vilanterol  1 puff Inhalation  Daily  . losartan  100 mg Oral Daily   And  . hydrochlorothiazide  25 mg Oral Daily  . ipratropium-albuterol  3 mL Nebulization BID  . latanoprost  1 drop Both Eyes QHS  . levothyroxine  200 mcg Oral QAC breakfast  . methylPREDNISolone (SOLU-MEDROL) injection  40 mg Intravenous Daily  . metoprolol tartrate  50 mg Oral BID  . mupirocin ointment  1 application Nasal BID  . omega-3 acid ethyl esters  3 g Oral Daily  . pantoprazole  40 mg Oral QPM  . potassium chloride  40 mEq Oral Q4H  . rOPINIRole  1 mg Oral Daily   And  . rOPINIRole  4 mg Oral QHS  . simvastatin  10 mg Oral q1800  . umeclidinium bromide  1 puff Inhalation Daily   Continuous Infusions:    LOS: 4 days   Charlynne Cousins  [facility] Pager [phone]  *Please refer to [email or web address], password [email or web address] to get updated schedule on who will round on this patient, as hospitalists switch teams weekly. If 7PM-7AM, please contact night-coverage at [email or web address], password [email or web address] for any overnight needs.  08/22/2017, 11:08 AM

## 2017-08-22 NOTE — Telephone Encounter (Signed)
Called the patient's wife to make her aware that since the patient is in the hospital we cant move the orders for the test to be completed there. The reason for this is because the MRI radiologist are only allotted so many outpatient slots and a lot of times are booked up for a couple weeks out. The pt was receiving multiple MRI's and also had LP that were scheduled. I understand the conveience of having them completed while being in the hospital and if the MD wants to order the images so that they can have them completed while being in the hospital he could do that and probably have a better chance of them being completed before we could schedule him in their outpatient slot.

## 2017-08-22 NOTE — Telephone Encounter (Signed)
Informed the patients wife of our efforts. She verbalized understanding

## 2017-08-22 NOTE — Progress Notes (Signed)
Physical Therapy Treatment Patient Details Name: [name] MRN: [id number] DOB: 07-20-1940 Today's Date: 08/22/2017    History of Present Illness 77 year old male who presented with dyspnea.  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     PT Comments    Pt pleasant and very motivated/eager to get OOB.  Pt has not been OOB at all.  Assisted to sitting EOB, once upright pt was able to self maintain static sitting balnce at Supervision.  Pt is unable to stand and pivot so assisted with lateral scooting from elevated bed to drop arm recliner using bed pad to assist.  Pt able 10% and therapist 90%.  Hoyer pad in place in recliner for staff to use lift to assist pt back to bed.   Follow Up Recommendations  SNF     Equipment Recommendations  None recommended by PT    Recommendations for Other Services       Precautions / Restrictions Precautions Precautions: Fall Precaution Comments: R LE hemiparesis (fetal position) Restrictions Weight Bearing Restrictions: No    Mobility  Bed Mobility Overal bed mobility: Needs Assistance Bed Mobility: Supine to Sit     Supine to sit: Mod assist     General bed mobility comments: mod A for LEs, pt able to assist with raising trunk using UEs on rail; pt sat at edge of bed x 7 minutes with BUE/LEs supported  Once upright, able to self maintain  Transfers Overall transfer level: Needs assistance Equipment used: None Transfers: Lateral/Scoot Transfers          Lateral/Scoot Transfers: Max assist;Total assist General transfer comment: from elevated bed to drop arm recliner via lateral scoot using bed pad to complete.  Pt required 90% assist (pt 10%)   pt has been using a [employer] fast streamer prior at home.  Ambulation/Gait                 Stairs             Wheelchair Mobility    Modified Rankin (Stroke Patients Only)       Balance Overall balance assessment: Needs assistance Sitting-balance support: Bilateral upper extremity supported;Feet supported Sitting balance-Leahy Scale: Poor Sitting balance - Comments: requires BUE support on bedrails for static sitting, flexed trunk and forward head posture                                    Cognition Arousal/Alertness: Awake/alert Behavior During Therapy: WFL for tasks assessed/performed Overall Cognitive Status: Within Functional Limits for tasks assessed                                 General Comments: pleasant      Exercises      General Comments        Pertinent Vitals/Pain Pain Assessment: 0-10 Pain Score: 5  Pain Location: R hip Pain Descriptors / Indicators: Aching Pain Intervention(s): Monitored during session;Repositioned;Premedicated before session    Home Living Family/patient expects to be discharged to:: Private residence Living Arrangements: Spouse/significant other Available Help at Discharge: Family;Available 24 hours/day Type of Home: House Home Access: Ramped entrance(has temporary ramp)   Home Layout: One level Home Equipment: Cane - single point;Walker - 4 wheels;Walker -  2 wheels;Other (comment)(hoyer)      Prior Function Level of Independence: Needs assistance  Gait / Transfers Assistance Needed: Harrel Lemon to transfer to [facility], pt can propel WC; son comes over to lift pt into car for Dr appointments ADL's / Homemaking Assistance Needed: wife assists pt to spong bathe Comments: pt was ambulatory until UTI late December, went to SNF after hospitalization for that. Progressive decline with RLE weakness. Has been using Harrel Lemon since he came home early Feb.    PT Goals (current goals can now be found in the care plan section) Acute Rehab PT Goals Patient Stated Goal: to be able to stand so he can assist with transfers, be able  to get into a car PT Goal Formulation: With patient/family Time For Goal Achievement: 09/03/17 Potential to Achieve Goals: Fair Progress towards PT goals: Progressing toward goals    Frequency    Min 3X/week      PT Plan Current plan remains appropriate    Co-evaluation              AM-PAC PT "6 Clicks" Daily Activity  Outcome Measure  Difficulty turning over in bed (including adjusting bedclothes, sheets and blankets)?: Unable Difficulty moving from lying on back to sitting on the side of the bed? : Unable Difficulty sitting down on and standing up from a chair with arms (e.g., wheelchair, bedside commode, etc,.)?: Unable Help needed moving to and from a bed to chair (including a wheelchair)?: Total Help needed walking in hospital room?: Total Help needed climbing 3-5 steps with a railing? : Total 6 Click Score: 6    End of Session Equipment Utilized During Treatment: Gait belt Activity Tolerance: Patient tolerated treatment well Patient left: in chair;with call bell/phone within reach Nurse Communication: Mobility status;Need for lift equipment PT Visit Diagnosis: Muscle weakness (generalized) (M62.81);Difficulty in walking, not elsewhere classified (R26.2)     Time: 0940-1005 PT Time Calculation (min) (ACUTE ONLY): 25 min  Charges:  $Therapeutic Activity: 23-37 mins                    G Codes:       Rica Koyanagi  PTA [facility]  Acute  Rehab Pager      [phone]

## 2017-08-22 NOTE — Progress Notes (Addendum)
08/22/17 0855  PT Visit Information  Assistance Needed +1 (mechanical lift for transfers)  History of Present Illness 77 year old male who presented with dyspnea.  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   Precautions  Precautions Fall  Restrictions  Weight Bearing Restrictions No  Home Living  Family/patient expects to be discharged to: Private residence  Living Arrangements Spouse/significant other  Available Help at Discharge Family;Available 24 hours/day  Type of [place] entrance (has temporary ramp)  Home Layout One level  Bathroom Shower/Tub Walk-in shower  [place] - single point;Walker - 4 wheels;Walker - 2 wheels;Other (comment) (hoyer)  Prior Function  Level of Independence Needs assistance  Gait / Transfers Assistance Needed Harrel Lemon to transfer to [facility], pt can propel WC; son comes over to lift pt into car for Dr appointments  ADL's / Homemaking Assistance Needed wife assists pt to spong bathe  Comments pt was ambulatory until UTI late December, went to SNF after hospitalization for that. Progressive decline with RLE weakness. Has been using Harrel Lemon since he came home early Feb.   Communication  Communication No difficulties  Pain Assessment  Pain Location chronic LBP  Pain Descriptors / Indicators Aching  Cognition  Arousal/Alertness Awake/alert  Behavior During Therapy WFL for tasks assessed/performed  Overall Cognitive Status Within Functional Limits for tasks assessed  Lower Extremity Assessment  RLE Deficits / Details hip/knee in flexed position in bed upon my arrival, pt stated "it does that",  DF/PF ankle 0/5, knee ext 0/5  LLE Deficits / Details knee ext -4/5  Bed Mobility  Overal bed mobility Needs Assistance   Bed Mobility Supine to Sit  Supine to sit Mod assist  General bed mobility comments mod A for LEs, pt able to assist with raising trunk using UEs on rail; pt sat at edge of bed x 10 minutes with BUE/LEs supported  Transfers  General transfer comment NT- pt using Hoyer at home  Balance  Overall balance assessment Needs assistance  Sitting-balance support Bilateral upper extremity supported;Feet supported  Sitting balance-Leahy Scale Poor  Sitting balance - Comments requires BUE support on bedrails for static sitting, flexed trunk and forward head posture  PT - End of Session  Activity Tolerance Patient tolerated treatment well  Patient left in bed;with family/visitor present;with nursing/sitter in room;with call bell/phone within reach  Nurse Communication Mobility status;Need for lift equipment  PT Assessment  PT Recommendation/Assessment Patient needs continued PT services  PT Visit Diagnosis Muscle weakness (generalized) (M62.81);Difficulty in walking, not elsewhere classified (R26.2)  PT Problem List Decreased strength;Decreased activity tolerance;Decreased mobility;Pain;Decreased balance  PT Plan  PT Frequency (ACUTE ONLY) Min 3X/week  PT Treatment/Interventions (ACUTE ONLY) Functional mobility training;Therapeutic activities;Therapeutic exercise;Balance training;Patient/family education  AM-PAC PT "6 Clicks" Daily Activity Outcome Measure  Difficulty turning over in bed (including adjusting bedclothes, sheets and blankets)? 1  Difficulty moving from lying on back to sitting on the side of the bed?  1  Difficulty sitting down on and standing up from a chair with arms (e.g., wheelchair, bedside commode, etc,.)? 1  Help needed moving to and from a bed to chair (including a wheelchair)? 1  Help needed walking in hospital room? 1  Help needed climbing 3-5 steps with a railing?  1  6 Click Score 6  Mobility G Code  CN  PT Recommendation  Follow Up Recommendations SNF  PT equipment  None recommended by PT  Acute Rehab PT Goals  Patient Stated Goal to be able to stand so he can assist with transfers, be able to get into a car  PT Goal Formulation With patient/family  Time For Goal Achievement 09/03/17  Potential to Achieve Goals Fair  PT Time Calculation  PT Start Time (ACUTE ONLY) 1438  PT Stop Time (ACUTE ONLY) 1502  PT Time Calculation (min) (ACUTE ONLY) 24 min  PT General Charges  $$ ACUTE PT VISIT 1 Visit  PT Evaluation  $PT Eval Moderate Complexity 1 Mod  PT Treatments  $Therapeutic Activity 8-22 mins  Written Expression  Dominant Hand Right  Blondell Reveal Kistler PT 08/22/2017  [phone]

## 2017-08-22 NOTE — Telephone Encounter (Signed)
Pt's wife called he has been admitted to [facility] on 08/18/17 with bronchitis. Pt is scheduled for 3 MRI's tomorrow, pt will still be in the hospital, he is to go to [facility] upon discharge. Pt's wife said there will not be transportation to get him to MRI appt tomorrow. RN at [facility] recommended the pt could have MRI's while at the hospital. She is wanting to know if the orders can be sent there. LP is scheduled for Friday afternoon. Please call her to advise asap.

## 2017-08-22 NOTE — Telephone Encounter (Signed)
[name] with our MRI scheduling has reached out to [facility] and the earliest apt they have available is march 13 to be able to do all three. I will call the patient and let her know that we at least looked into it

## 2017-08-23 ENCOUNTER — Other Ambulatory Visit: Payer: Medicare Other

## 2017-08-23 ENCOUNTER — Ambulatory Visit
Admit: 2017-08-23 | Discharge: 2017-08-23 | Disposition: A | Discharge status: Home / Self Care | Payer: Medicare Other | Attending: Neurology | Admitting: Neurology

## 2017-08-23 DIAGNOSIS — M6258 Muscle wasting and atrophy, not elsewhere classified, other site: Secondary | ICD-10-CM

## 2017-08-23 DIAGNOSIS — R32 Unspecified urinary incontinence: Secondary | ICD-10-CM | POA: Diagnosis not present

## 2017-08-23 DIAGNOSIS — R292 Abnormal reflex: Secondary | ICD-10-CM

## 2017-08-23 DIAGNOSIS — G629 Polyneuropathy, unspecified: Secondary | ICD-10-CM

## 2017-08-23 DIAGNOSIS — M6281 Muscle weakness (generalized): Secondary | ICD-10-CM

## 2017-08-23 DIAGNOSIS — R29898 Other symptoms and signs involving the musculoskeletal system: Secondary | ICD-10-CM

## 2017-08-23 DIAGNOSIS — M48062 Spinal stenosis, lumbar region with neurogenic claudication: Secondary | ICD-10-CM

## 2017-08-23 DIAGNOSIS — G253 Myoclonus: Secondary | ICD-10-CM

## 2017-08-23 DIAGNOSIS — R159 Full incontinence of feces: Secondary | ICD-10-CM

## 2017-08-23 DIAGNOSIS — R29818 Other symptoms and signs involving the nervous system: Secondary | ICD-10-CM

## 2017-08-23 LAB — GLUCOSE, CAPILLARY: Glucose-Capillary: 104 mg/dL — ABNORMAL HIGH (ref 65–99)

## 2017-08-23 MED ORDER — UMECLIDINIUM BROMIDE 62.5 MCG/INH IN AEPB: 1.0000 | INHALATION_SPRAY | Freq: Every day | RESPIRATORY_TRACT | Status: DC

## 2017-08-23 MED ORDER — FLUTICASONE FUROATE-VILANTEROL 100-25 MCG/INH IN AEPB: 1.0000 | INHALATION_SPRAY | Freq: Every day | RESPIRATORY_TRACT | Status: DC

## 2017-08-23 MED ORDER — METFORMIN HCL 500 MG PO TABS: 500.0000 mg | ORAL_TABLET | Freq: Two times a day (BID) | ORAL | 0 refills | Status: DC

## 2017-08-23 MED ORDER — PREDNISONE 10 MG PO TABS: ORAL_TABLET | ORAL | 0 refills | Status: DC

## 2017-08-23 MED ORDER — GADOBENATE DIMEGLUMINE 529 MG/ML IV SOLN
20.0000 mL | Freq: Once | INTRAVENOUS | Status: AC | PRN
  Administered 2017-08-23: 20 mL via INTRAVENOUS

## 2017-08-23 NOTE — Telephone Encounter (Signed)
[facility] called requesting a call back to discuss the pts MRIs that are scheduled for today at 5:30 not sure when to r/s or what to do, please contact at [phone]

## 2017-08-23 NOTE — Progress Notes (Deleted)
Physician Discharge Summary  [name]  BZJ:[id number]  DOB: [birth year]  DOA: 08/18/2017 PCP: [name], MD  Admit date: 08/18/2017 Discharge date: 08/23/2017  Admitted From: Home Disposition: SNF  Recommendations for Outpatient Follow-up:  1. Follow up with SNF provider at earliest convenient 2. Please obtain BMP/CBC in one week to monitor hemoglobin and creatinine 3. Please follow up on the following pending results: Final Blood cultures so far negative.   Discharge Condition: Stable  CODE STATUS: Full code  Diet recommendation: Heart Healthy  Brief/Interim Summary: For full details see H&P/Progress note, but in brief, [name] is a 77 y.o. 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 Patient was treated with abx and O2 supplementation. Subsequently patient improved and was successfully weaned off oxygen. Patient complete a course of azithromycin. He was seen by PT and recommended SNF. Patient deemed stable for d/c.   Subjective: Patient seen and examined, has no complaints this morning.  Report ready to go to rehab.  Denies chest pain, shortness of breath, palpitations and dizziness.  Discharge Diagnoses/Hospital Course:  1. Acute hypoxic respiratory failure due to COPD exacerbation.  Improved, patient now on room air was successfully weaned off oxygen.  Completed course of azithromycin times 5 days.  Will continue bronchodilators and prednisone taper.  Follow-up as an outpatient.  2. Staphylococcus epidermidis (methicillin resistant), positive blood cultures 2 / 2 bottles. No signs of systemic infection, echocardiogram with no vegetations, repeated blood cultures, shows no growth over 48 hrs with clinical improvement and afebrile, will assume contamination. Discussed  with ID no need for abx therapy.   2. HTN.BP stable during hospital stay, cont home meds with no changes   3. Dyslipidemia.Continue statin  4. Parkinson's. Continue home carbidopa   5. Levothyroxine.Continue synthroid   6. Hypokalemia. - resolved   All other chronic medical condition were stable during the hospitalization.  Patient was seen by physical therapy, recommending SNF On the day of the discharge the patient's vitals were stable, and no other acute medical condition were reported by patient. the patient was felt safe to be discharge to SNF   Discharge Instructions  You were cared for by a hospitalist during your hospital stay. If you have any questions about your discharge medications or the care you received while you were in the hospital after you are discharged, you can call the unit and asked to speak with the hospitalist on call if the hospitalist that took care of you is not available. Once you are discharged, your primary care physician will handle any further medical issues. Please note that NO REFILLS for any discharge medications will be authorized once you are discharged, as it is imperative that you return to your primary care physician (or establish a relationship with a primary care physician if you do not have one) for your aftercare needs so that they can reassess your need for medications and monitor your lab values.  Discharge Instructions    Call MD for:  difficulty breathing, headache or visual disturbances   Complete by:  As directed    Call MD for:  extreme fatigue   Complete by:  As directed    Call MD for:  hives   Complete by:  As directed    Call MD for:  persistant dizziness or light-headedness   Complete by:  As  directed    Call MD for:  persistant nausea and vomiting   Complete by:  As directed    Call MD for:  redness, tenderness, or signs of infection (pain, swelling, redness, odor or green/yellow discharge around incision site)    Complete by:  As directed    Call MD for:  severe uncontrolled pain   Complete by:  As directed    Call MD for:  temperature >100.4   Complete by:  As directed    Diet - low sodium heart healthy   Complete by:  As directed    Increase activity slowly   Complete by:  As directed      Allergies as of 08/23/2017   No Known Allergies     Medication List    STOP taking these medications   ANORO ELLIPTA 62.5-25 MCG/INH Aepb Generic drug:  umeclidinium-vilanterol   ARNUITY ELLIPTA 100 MCG/ACT Aepb Generic drug:  Fluticasone Furoate Replaced by:  fluticasone furoate-vilanterol 100-25 MCG/INH Aepb     TAKE these medications   allopurinol 300 MG tablet Commonly known as:  ZYLOPRIM Take 300 mg by mouth daily.   calcium citrate-vitamin D 315-200 MG-UNIT tablet Commonly known as:  CITRACAL+D Take 1 tablet by mouth daily. 750 mg   carbidopa-levodopa 10-100 MG tablet Commonly known as:  SINEMET IR Take 1 tablet by mouth 2 (two) times daily. What changed:    when to take this  additional instructions   cholecalciferol 1000 units tablet Commonly known as:  VITAMIN D Take 1,000 Units by mouth daily.   cloNIDine 0.1 MG tablet Commonly known as:  CATAPRES Take 0.1 mg by mouth 2 (two) times daily.   COLCRYS 0.6 MG tablet Generic drug:  colchicine Take 0.6 mg by mouth daily.   fish oil-omega-3 fatty acids 1000 MG capsule Take 3 g by mouth daily.   fluticasone furoate-vilanterol 100-25 MCG/INH Aepb Commonly known as:  BREO ELLIPTA Inhale 1 puff into the lungs daily. Start taking on:  08/24/2017 Replaces:  ARNUITY ELLIPTA 100 MCG/ACT Aepb   guaifenesin 400 MG Tabs tablet Commonly known as:  HUMIBID E Take 400 mg by mouth 2 (two) times daily as needed (congestion).   HYDROcodone-acetaminophen 5-325 MG tablet Commonly known as:  NORCO/VICODIN Take 1 tablet by mouth 3 (three) times daily as needed (pain).   levothyroxine 200 MCG tablet Commonly known as:  SYNTHROID,  LEVOTHROID Take 200 mcg by mouth daily before breakfast.   losartan-hydrochlorothiazide 100-25 MG tablet Commonly known as:  HYZAAR Take 1 tablet by mouth daily.   metFORMIN 500 MG tablet Commonly known as:  GLUCOPHAGE Take 1 tablet (500 mg total) by mouth 2 (two) times daily with a meal. Start taking on:  08/28/2017 What changed:    See the new instructions.  These instructions start on 08/28/2017. If you are unsure what to do until then, ask your doctor or other care provider.   metoprolol tartrate 50 MG tablet Commonly known as:  LOPRESSOR Take 50 mg by mouth 2 (two) times daily.   multivitamin tablet Take 1 tablet by mouth daily.   nitroGLYCERIN 0.4 MG SL tablet Commonly known as:  NITROSTAT Place 0.4 mg every 5 (five) minutes as needed under the tongue for chest pain.   oxymetazoline 0.05 % nasal spray Commonly known as:  AFRIN Place 2 sprays into the nose 2 (two) times daily as needed for congestion.   pantoprazole 40 MG tablet Commonly known as:  PROTONIX Take 40 mg by mouth every evening.  predniSONE 10 MG tablet Commonly known as:  DELTASONE Take 4 tablets for 3 days; Take 3 tablets for 4 days; Take 2 tablets for 3 days; Take 1 tablet for 4 days   rizatriptan 10 MG tablet Commonly known as:  MAXALT Take 10 mg by mouth as needed for migraine. May repeat in 2 hours if needed   rOPINIRole 1 MG tablet Commonly known as:  REQUIP TAKE 1 TABLET BY MOUTH IN THE MORNING AND 4 TABLETS AT BEDTIME What changed:  See the new instructions.   simvastatin 40 MG tablet Commonly known as:  ZOCOR   TRAVATAN Z 0.004 % Soln ophthalmic solution Generic drug:  Travoprost (BAK Free) Place 1 drop into both eyes.   umeclidinium bromide 62.5 MCG/INH Aepb Commonly known as:  INCRUSE ELLIPTA Inhale 1 puff into the lungs daily. Start taking on:  08/24/2017   VENTOLIN HFA 108 (90 Base) MCG/ACT inhaler Generic drug:  albuterol Inhale 1-2 puffs into the lungs every 4 (four) hours  as needed for wheezing or shortness of breath.       No Known Allergies  Consultations:  None   Procedures/Studies: Dg Chest 2 View  Result Date: 08/19/2017 CLINICAL DATA:  77 year old male with history of pneumonia. Follow-up study. EXAM: CHEST  2 VIEW COMPARISON:  Chest x-ray 08/18/2017. FINDINGS: Small left pleural effusion with probable subsegmental atelectasis in the left lung base. Right lung is clear. No definite consolidative airspace disease. No right pleural effusion. No pneumothorax. No evidence of pulmonary edema. Heart size is borderline enlarged. Upper mediastinal contours are within normal limits. Aortic atherosclerosis. Surgical clips at the level of the thoracic inlet from prior thyroidectomy. IMPRESSION: 1. Low lung volumes with subsegmental atelectasis in the left lower lobe and small left pleural effusion. 2. Aortic atherosclerosis. Electronically Signed   By: Vinnie Langton M.D.   On: 08/19/2017 10:05   Dg Chest 2 View  Result Date: 08/18/2017 CLINICAL DATA:  Cough, congestion, shortness of breath EXAM: CHEST  2 VIEW COMPARISON:  06/20/2017 FINDINGS: Bilateral diffuse interstitial thickening and peribronchial cuffing most concerning for bronchitis. There is no pleural effusion or pneumothorax. The heart and mediastinal contours are unremarkable. The osseous structures are unremarkable. IMPRESSION: Bilateral diffuse interstitial thickening and peribronchial cuffing most concerning for bronchitis. Electronically Signed   By: Kathreen Devoid   On: 08/18/2017 13:36   Ct Angio Chest Pe W/cm &/or Wo Cm  Result Date: 08/18/2017 CLINICAL DATA:  Shortness of breath. EXAM: CT ANGIOGRAPHY CHEST WITH CONTRAST TECHNIQUE: Multidetector CT imaging of the chest was performed using the standard protocol during bolus administration of intravenous contrast. Multiplanar CT image reconstructions and MIPs were obtained to evaluate the vascular anatomy. CONTRAST:  80 mL ISOVUE-370 IOPAMIDOL  (ISOVUE-370) INJECTION 76% COMPARISON:  CT scan of January 03, 2017. FINDINGS: Cardiovascular: Satisfactory opacification of the pulmonary arteries to the segmental level. No evidence of pulmonary embolism. Normal heart size. No pericardial effusion. Atherosclerosis of thoracic aorta is noted without dissection. 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 Mediastinum/Nodes: Status post thyroidectomy. Esophagus is unremarkable. No mediastinal adenopathy is noted. Lungs/Pleura: No pneumothorax is noted. Mild left pleural effusion is noted with adjacent left lower lobe atelectasis. Bilateral upper lobe opacities are noted most consistent with subsegmental atelectasis. Upper Abdomen: No acute abnormality. Musculoskeletal: No chest wall abnormality. No acute or significant osseous findings. Review of the MIP images confirms the above findings. IMPRESSION: No definite evidence of pulmonary embolus. 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 Recommend annual imaging followup by  CTA or MRA. This recommendation follows 2010 ACCF/AHA/AATS/ACR/ASA/SCA/SCAI/SIR/STS/SVM Guidelines for the Diagnosis and Management of Patients with Thoracic Aortic Disease. Circulation. 2010; 121: U725-D664. Mild left pleural effusion with adjacent left lower lobe atelectasis. Bilateral upper lobe subsegmental atelectasis is noted as well. Aortic Atherosclerosis (ICD10-I70.0). Electronically Signed   By: Marijo Conception, M.D.   On: 08/18/2017 15:34     Discharge Exam: Vitals:   08/23/17 0812 08/23/17 0847  BP:  130/80  Pulse:    Resp:    Temp:    SpO2: 94%    Vitals:   08/23/17 0500 08/23/17 0612 08/23/17 0812 08/23/17 0847  BP:  118/72  130/80  Pulse:  69    Resp:  20    Temp:  (!) 97.5 F (36.4 C)    TempSrc:  Axillary    SpO2:  96% 94%   Weight: 93.2 kg (205 lb 7.5 oz)     Height:        General: Pt is alert, awake, not in acute distress Cardiovascular: RRR, S1/S2 +, no rubs, no gallops Respiratory: CTA  bilaterally, no wheezing, no rhonchi Abdominal: Soft, NT, ND, bowel sounds + Extremities: no edema, no cyanosis  The results of significant diagnostics from this hospitalization (including imaging, microbiology, ancillary and laboratory) are listed below for reference.     Microbiology: Recent Results (from the past 240 hour(s))  Blood culture (routine x 2)     Status: Abnormal   Collection Time: 08/18/17  3:47 PM  Result Value Ref Range Status   Specimen Description   Final    BLOOD LEFT HAND Performed at [facility], [place] [address], [place], [place] [address]    Special Requests   Final    BOTTLES DRAWN AEROBIC AND ANAEROBIC Blood Culture adequate volume Performed at [facility] [address] [place], [place] [address]    Culture  Setup Time   Final    GRAM POSITIVE COCCI IN CLUSTERS IN BOTH AEROBIC AND ANAEROBIC BOTTLES CRITICAL RESULT CALLED TO, READ BACK BY AND VERIFIED WITH: TGREEN,PHARMD @1748  08/19/17 BY LHOWARD Performed at [facility], [place] [address] [place], [place] [address]    Culture STAPHYLOCOCCUS EPIDERMIDIS (A)  Final   Report Status 08/21/2017 FINAL  Final   Organism ID, Bacteria STAPHYLOCOCCUS EPIDERMIDIS  Final      Susceptibility   Staphylococcus epidermidis - MIC*    CIPROFLOXACIN >=8 RESISTANT Resistant     ERYTHROMYCIN <=0.25 SENSITIVE Sensitive     GENTAMICIN <=0.5 SENSITIVE Sensitive     OXACILLIN >=4 RESISTANT Resistant     TETRACYCLINE 2 SENSITIVE Sensitive     VANCOMYCIN 2 SENSITIVE Sensitive     TRIMETH/SULFA <=10 SENSITIVE Sensitive     CLINDAMYCIN <=0.25 SENSITIVE Sensitive     RIFAMPIN <=0.5 SENSITIVE Sensitive     Inducible Clindamycin NEGATIVE Sensitive     * STAPHYLOCOCCUS EPIDERMIDIS  Blood Culture ID Panel (Reflexed)     Status: Abnormal   Collection Time: 08/18/17  3:47 PM  Result Value Ref Range Status   Enterococcus species NOT DETECTED NOT DETECTED Final   Listeria monocytogenes  NOT DETECTED NOT DETECTED Final   Staphylococcus species DETECTED (A) NOT DETECTED Final    Comment: Methicillin (oxacillin) resistant coagulase negative staphylococcus. Possible blood culture contaminant (unless isolated from more than one blood culture draw or clinical case suggests pathogenicity). No antibiotic treatment is indicated for blood  culture contaminants. CRITICAL RESULT CALLED TO, READ BACK BY AND VERIFIED WITH: TGREEN,PHARMD @  1748 08/19/17 BY LHOWARD    Staphylococcus aureus NOT DETECTED NOT DETECTED Final   Methicillin resistance DETECTED (A) NOT DETECTED Final    Comment: CRITICAL RESULT CALLED TO, READ BACK BY AND VERIFIED WITH: TGREEN,PHARM @1743  08/19/17 BY LHOWARD    Streptococcus species NOT DETECTED NOT DETECTED Final   Streptococcus agalactiae NOT DETECTED NOT DETECTED Final   Streptococcus pneumoniae NOT DETECTED NOT DETECTED Final   Streptococcus pyogenes NOT DETECTED NOT DETECTED Final   Acinetobacter baumannii NOT DETECTED NOT DETECTED Final   Enterobacteriaceae species NOT DETECTED NOT DETECTED Final   Enterobacter cloacae complex NOT DETECTED NOT DETECTED Final   Escherichia coli NOT DETECTED NOT DETECTED Final   Klebsiella oxytoca NOT DETECTED NOT DETECTED Final   Klebsiella pneumoniae NOT DETECTED NOT DETECTED Final   Proteus species NOT DETECTED NOT DETECTED Final   Serratia marcescens NOT DETECTED NOT DETECTED Final   Haemophilus influenzae NOT DETECTED NOT DETECTED Final   Neisseria meningitidis NOT DETECTED NOT DETECTED Final   Pseudomonas aeruginosa NOT DETECTED NOT DETECTED Final   Candida albicans NOT DETECTED NOT DETECTED Final   Candida glabrata NOT DETECTED NOT DETECTED Final   Candida krusei NOT DETECTED NOT DETECTED Final   Candida parapsilosis NOT DETECTED NOT DETECTED Final   Candida tropicalis NOT DETECTED NOT DETECTED Final    Comment: Performed at [facility], [place]. [address], [place], [place] [address]  Blood culture (routine x  2)     Status: Abnormal   Collection Time: 08/18/17  3:52 PM  Result Value Ref Range Status   Specimen Description   Final    BLOOD LEFT WRIST Performed at [facility] [address], [place], [place] [address]    Special Requests   Final    BOTTLES DRAWN AEROBIC AND ANAEROBIC Blood Culture adequate volume Performed at [facility] [address] [place], [place] [address]    Culture  Setup Time   Final    IN BOTH AEROBIC AND ANAEROBIC BOTTLES GRAM POSITIVE COCCI IN PAIRS IN CLUSTERS CRITICAL VALUE NOTED.  VALUE IS CONSISTENT WITH PREVIOUSLY REPORTED AND CALLED VALUE.    Culture (A)  Final    STAPHYLOCOCCUS EPIDERMIDIS SUSCEPTIBILITIES PERFORMED ON PREVIOUS CULTURE WITHIN THE LAST 5 DAYS. Performed at [facility], [place] [address] [place], [place] [address]    Report Status 08/21/2017 FINAL  Final  MRSA PCR Screening     Status: Abnormal   Collection Time: 08/19/17  8:06 AM  Result Value Ref Range Status   MRSA by PCR POSITIVE (A) NEGATIVE Final    Comment:        The GeneXpert MRSA Assay (FDA approved for NASAL specimens only), is one component of a comprehensive MRSA colonization surveillance program. It is not intended to diagnose MRSA infection nor to guide or monitor treatment for MRSA infections. CRITICAL RESULT CALLED TO, READ BACK BY AND VERIFIED WITH: CHEEK,K AT [place] [id number] BY HOOKER,B Performed at [facility], [place] [address] [place], [place] [address]   Culture, blood (routine x 2)     Status: None (Preliminary result)   Collection Time: 08/20/17 12:17 PM  Result Value Ref Range Status   Specimen Description   Final    BLOOD LEFT ARM Performed at [facility], [place] [address], [place], [place] [address]    Special Requests   Final    BOTTLES DRAWN AEROBIC ONLY Blood Culture adequate volume Performed at [facility],  [place] [address] [place], [place] [address]     Culture   Final    NO GROWTH 3 DAYS Performed at [facility], [place] [address], [place], [place] [address]    Report Status PENDING  Incomplete  Culture, blood (routine x 2)     Status: None (Preliminary result)   Collection Time: 08/20/17 12:17 PM  Result Value Ref Range Status   Specimen Description   Final    BLOOD RIGHT ARM Performed at [facility], [place] [address] [place], [place] [address]    Special Requests   Final    IN PEDIATRIC BOTTLE Blood Culture adequate volume Performed at [facility] [address] [place], [place] [address]    Culture   Final    NO GROWTH 3 DAYS Performed at [facility], [place] [address], [place], [place] [address]    Report Status PENDING  Incomplete     Labs: BNP (last 3 results) Recent Labs    08/18/17 1230  BNP 13.0   Basic Metabolic Panel: Recent Labs  Lab 08/18/17 1230 08/19/17 0423 08/20/17 0512 08/21/17 0538 08/22/17 0601  NA 143 142 144 142 142  K 3.0* 3.5 3.6 3.1* 3.1*  CL 101 104 104 101 101  CO2 26 28 32 30 29  GLUCOSE 112* 110* 101* 93 101*  BUN 32* 25* 20 19 19   CREATININE 0.98 0.81 0.79 0.83 0.79  CALCIUM 9.0 8.5* 8.5* 8.4* 8.3*  MG  --   --   --   --  1.9   Liver Function Tests: Recent Labs  Lab 08/18/17 1230  AST 28  ALT 20  ALKPHOS 67  BILITOT 0.6  PROT 6.7  ALBUMIN 3.2*   No results for input(s): LIPASE, AMYLASE in the last 168 hours. No results for input(s): AMMONIA in the last 168 hours. CBC: Recent Labs  Lab 08/18/17 1230 08/19/17 0423 08/21/17 0538  WBC 13.1* 10.1 9.2  NEUTROABS 10.7*  --  5.6  HGB 13.9 12.3* 12.0*  HCT 41.0 37.5* 36.6*  MCV 88.4 91.0 89.7  PLT 301 297 333   Cardiac Enzymes: Recent Labs  Lab 08/18/17 1230  TROPONINI <0.03   BNP: Invalid input(s): POCBNP CBG: Recent Labs  Lab 08/18/17 1303 08/19/17 0757 08/20/17 0840 08/21/17 0742 08/23/17 0717  GLUCAP 93 97 151* 101* 104*   D-Dimer No results for input(s): DDIMER  in the last 72 hours. Hgb A1c No results for input(s): HGBA1C in the last 72 hours. Lipid Profile No results for input(s): CHOL, HDL, LDLCALC, TRIG, CHOLHDL, LDLDIRECT in the last 72 hours. Thyroid function studies No results for input(s): TSH, T4TOTAL, T3FREE, THYROIDAB in the last 72 hours.  Invalid input(s): FREET3 Anemia work up No results for input(s): VITAMINB12, FOLATE, FERRITIN, TIBC, IRON, RETICCTPCT in the last 72 hours. Urinalysis    Component Value Date/Time   COLORURINE YELLOW 06/20/2017 1129   APPEARANCEUR TURBID (A) 06/20/2017 1129   LABSPEC 1.020 06/20/2017 1129   PHURINE 5.5 06/20/2017 1129   GLUCOSEU NEGATIVE 06/20/2017 1129   HGBUR TRACE (A) 06/20/2017 1129   BILIRUBINUR NEGATIVE 06/20/2017 1129   KETONESUR NEGATIVE 06/20/2017 1129   PROTEINUR NEGATIVE 06/20/2017 1129   UROBILINOGEN 1.0 09/09/2010 1045   NITRITE POSITIVE (A) 06/20/2017 1129   LEUKOCYTESUR LARGE (A) 06/20/2017 1129   Sepsis Labs Invalid input(s): PROCALCITONIN,  WBC,  LACTICIDVEN Microbiology Recent Results (from the past 240 hour(s))  Blood culture (routine x 2)  Status: Abnormal   Collection Time: 08/18/17  3:47 PM  Result Value Ref Range Status   Specimen Description   Final    BLOOD LEFT HAND Performed at [facility] [address], [place], [place] [address]    Special Requests   Final    BOTTLES DRAWN AEROBIC AND ANAEROBIC Blood Culture adequate volume Performed at [facility] [address] [place], [place] [address]    Culture  Setup Time   Final    GRAM POSITIVE COCCI IN CLUSTERS IN BOTH AEROBIC AND ANAEROBIC BOTTLES CRITICAL RESULT CALLED TO, READ BACK BY AND VERIFIED WITH: TGREEN,PHARMD @1748  08/19/17 BY LHOWARD Performed at [facility], [place] [address] [place], [place] [address]    Culture STAPHYLOCOCCUS EPIDERMIDIS (A)  Final   Report Status 08/21/2017 FINAL  Final   Organism ID, Bacteria STAPHYLOCOCCUS EPIDERMIDIS   Final      Susceptibility   Staphylococcus epidermidis - MIC*    CIPROFLOXACIN >=8 RESISTANT Resistant     ERYTHROMYCIN <=0.25 SENSITIVE Sensitive     GENTAMICIN <=0.5 SENSITIVE Sensitive     OXACILLIN >=4 RESISTANT Resistant     TETRACYCLINE 2 SENSITIVE Sensitive     VANCOMYCIN 2 SENSITIVE Sensitive     TRIMETH/SULFA <=10 SENSITIVE Sensitive     CLINDAMYCIN <=0.25 SENSITIVE Sensitive     RIFAMPIN <=0.5 SENSITIVE Sensitive     Inducible Clindamycin NEGATIVE Sensitive     * STAPHYLOCOCCUS EPIDERMIDIS  Blood Culture ID Panel (Reflexed)     Status: Abnormal   Collection Time: 08/18/17  3:47 PM  Result Value Ref Range Status   Enterococcus species NOT DETECTED NOT DETECTED Final   Listeria monocytogenes NOT DETECTED NOT DETECTED Final   Staphylococcus species DETECTED (A) NOT DETECTED Final    Comment: Methicillin (oxacillin) resistant coagulase negative staphylococcus. Possible blood culture contaminant (unless isolated from more than one blood culture draw or clinical case suggests pathogenicity). No antibiotic treatment is indicated for blood  culture contaminants. CRITICAL RESULT CALLED TO, READ BACK BY AND VERIFIED WITH: TGREEN,PHARMD @1748  08/19/17 BY LHOWARD    Staphylococcus aureus NOT DETECTED NOT DETECTED Final   Methicillin resistance DETECTED (A) NOT DETECTED Final    Comment: CRITICAL RESULT CALLED TO, READ BACK BY AND VERIFIED WITH: TGREEN,PHARM @1743  08/19/17 BY LHOWARD    Streptococcus species NOT DETECTED NOT DETECTED Final   Streptococcus agalactiae NOT DETECTED NOT DETECTED Final   Streptococcus pneumoniae NOT DETECTED NOT DETECTED Final   Streptococcus pyogenes NOT DETECTED NOT DETECTED Final   Acinetobacter baumannii NOT DETECTED NOT DETECTED Final   Enterobacteriaceae species NOT DETECTED NOT DETECTED Final   Enterobacter cloacae complex NOT DETECTED NOT DETECTED Final   Escherichia coli NOT DETECTED NOT DETECTED Final   Klebsiella oxytoca NOT DETECTED NOT  DETECTED Final   Klebsiella pneumoniae NOT DETECTED NOT DETECTED Final   Proteus species NOT DETECTED NOT DETECTED Final   Serratia marcescens NOT DETECTED NOT DETECTED Final   Haemophilus influenzae NOT DETECTED NOT DETECTED Final   Neisseria meningitidis NOT DETECTED NOT DETECTED Final   Pseudomonas aeruginosa NOT DETECTED NOT DETECTED Final   Candida albicans NOT DETECTED NOT DETECTED Final   Candida glabrata NOT DETECTED NOT DETECTED Final   Candida krusei NOT DETECTED NOT DETECTED Final   Candida parapsilosis NOT DETECTED NOT DETECTED Final   Candida tropicalis NOT DETECTED NOT DETECTED Final    Comment: Performed at [facility], [place]. [address] [place],  [address]  Blood culture (routine  x 2)     Status: Abnormal   Collection Time: 08/18/17  3:52 PM  Result Value Ref Range Status   Specimen Description   Final    BLOOD LEFT WRIST Performed at [facility] [address], [place], [place] [address]    Special Requests   Final    BOTTLES DRAWN AEROBIC AND ANAEROBIC Blood Culture adequate volume Performed at [facility] [place] [address] [place], [place] [address]    Culture  Setup Time   Final    IN BOTH AEROBIC AND ANAEROBIC BOTTLES GRAM POSITIVE COCCI IN PAIRS IN CLUSTERS CRITICAL VALUE NOTED.  VALUE IS CONSISTENT WITH PREVIOUSLY REPORTED AND CALLED VALUE.    Culture (A)  Final    STAPHYLOCOCCUS EPIDERMIDIS SUSCEPTIBILITIES PERFORMED ON PREVIOUS CULTURE WITHIN THE LAST 5 DAYS. Performed at [facility], [place] [address], [place], [place] [address]    Report Status 08/21/2017 FINAL  Final  MRSA PCR Screening     Status: Abnormal   Collection Time: 08/19/17  8:06 AM  Result Value Ref Range Status   MRSA by PCR POSITIVE (A) NEGATIVE Final    Comment:        The GeneXpert MRSA Assay (FDA approved for NASAL specimens only), is one component of a comprehensive MRSA colonization surveillance program. It is  not intended to diagnose MRSA infection nor to guide or monitor treatment for MRSA infections. CRITICAL RESULT CALLED TO, READ BACK BY AND VERIFIED WITH: CHEEK,K AT [place] [id number] BY HOOKER,B Performed at [facility], [place] [address] [place], [place] [address]   Culture, blood (routine x 2)     Status: None (Preliminary result)   Collection Time: 08/20/17 12:17 PM  Result Value Ref Range Status   Specimen Description   Final    BLOOD LEFT ARM Performed at [facility], [place] [address], [place], [place] [address]    Special Requests   Final    BOTTLES DRAWN AEROBIC ONLY Blood Culture adequate volume Performed at [place] [address] [place], [place] [address]    Culture   Final    NO GROWTH 3 DAYS Performed at [facility], [place] [address] [place], [place] [address]    Report Status PENDING  Incomplete  Culture, blood (routine x 2)     Status: None (Preliminary result)   Collection Time: 08/20/17 12:17 PM  Result Value Ref Range Status   Specimen Description   Final    BLOOD RIGHT ARM Performed at [facility], [place] [address], [place], [place] [address]    Special Requests   Final    IN PEDIATRIC BOTTLE Blood Culture adequate volume Performed at [place] [address], [place], [place] [address]    Culture   Final    NO GROWTH 3 DAYS Performed at [facility], [place] [address], [place],  [address]    Report Status PENDING  Incomplete     Time coordinating discharge: 32 minutes  SIGNED:  Chipper Oman, MD  [facility] 08/23/2017, 2:13 PM  Pager please text page via  [email or web address]  Note - This record has been created using [employer]. Chart creation errors have been sought, but may not always have been located. Such creation errors do not reflect on the standard of medical care.

## 2017-08-23 NOTE — Discharge Summary (Signed)
Physician Discharge Summary  LOYDE ORTH  BSJ:[id number]  DOB: 03-Aug-1940  DOA: 08/18/2017 PCP: [name], MD  Admit date: 08/18/2017 Discharge date: 08/23/2017  Admitted From: Home Disposition: SNF  Recommendations for Outpatient Follow-up:  1. Follow up with SNF provider at earliest convenient 2. Please obtain BMP/CBC in one week to monitor hemoglobin and creatinine 3. Please follow up on the following pending results: Final Blood cultures so far negative.   Discharge Condition: Stable  CODE STATUS: Full code  Diet recommendation: Heart Healthy  Brief/Interim Summary: For full details see H&P/Progress note, but in brief, [name] is a 77 y.o. 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 Patient was treated with abx and O2 supplementation. Subsequently patient improved and was successfully weaned off oxygen. Patient complete a course of azithromycin. He was seen by PT and recommended SNF. Patient deemed stable for d/c.   Subjective: Patient seen and examined, has no complaints this morning.  Report ready to go to rehab.  Denies chest pain, shortness of breath, palpitations and dizziness.  Discharge Diagnoses/Hospital Course:  1. Acute hypoxic respiratory failure due to COPD exacerbation.  Improved, patient now on room air was successfully weaned off oxygen.  Completed course of azithromycin times 5 days.  Will continue bronchodilators and prednisone taper.  Follow-up as an outpatient.  2. Staphylococcus epidermidis (methicillin resistant), positive blood cultures 2 / 2 bottles. No signs of systemic infection, echocardiogram with no vegetations, repeated blood cultures, shows no growth over 48 hrs with clinical improvement and afebrile, will assume contamination. Discussed  with ID no need for abx therapy.   2. HTN.BP stable during hospital stay, cont home meds with no changes   3. Dyslipidemia.Continue statin  4. Parkinson's. Continue home carbidopa   5. Levothyroxine.Continue synthroid   6. Hypokalemia. - resolved   All other chronic medical condition were stable during the hospitalization.  Patient was seen by physical therapy, recommending SNF On the day of the discharge the patient's vitals were stable, and no other acute medical condition were reported by patient. the patient was felt safe to be discharge to SNF   Discharge Instructions  You were cared for by a hospitalist during your hospital stay. If you have any questions about your discharge medications or the care you received while you were in the hospital after you are discharged, you can call the unit and asked to speak with the hospitalist on call if the hospitalist that took care of you is not available. Once you are discharged, your primary care physician will handle any further medical issues. Please note that NO REFILLS for any discharge medications will be authorized once you are discharged, as it is imperative that you return to your primary care physician (or establish a relationship with a primary care physician if you do not have one) for your aftercare needs so that they can reassess your need for medications and monitor your lab values.  Discharge Instructions    Call MD for:  difficulty breathing, headache or visual disturbances   Complete by:  As directed    Call MD for:  extreme fatigue   Complete by:  As directed    Call MD for:  hives   Complete by:  As directed    Call MD for:  persistant dizziness or light-headedness   Complete by:  As  directed    Call MD for:  persistant nausea and vomiting   Complete by:  As directed    Call MD for:  redness, tenderness, or signs of infection (pain, swelling, redness, odor or green/yellow discharge around incision site)    Complete by:  As directed    Call MD for:  severe uncontrolled pain   Complete by:  As directed    Call MD for:  temperature >100.4   Complete by:  As directed    Diet - low sodium heart healthy   Complete by:  As directed    Increase activity slowly   Complete by:  As directed      Allergies as of 08/23/2017   No Known Allergies     Medication List    STOP taking these medications   ANORO ELLIPTA 62.5-25 MCG/INH Aepb Generic drug:  umeclidinium-vilanterol   ARNUITY ELLIPTA 100 MCG/ACT Aepb Generic drug:  Fluticasone Furoate Replaced by:  fluticasone furoate-vilanterol 100-25 MCG/INH Aepb     TAKE these medications   allopurinol 300 MG tablet Commonly known as:  ZYLOPRIM Take 300 mg by mouth daily.   calcium citrate-vitamin D 315-200 MG-UNIT tablet Commonly known as:  CITRACAL+D Take 1 tablet by mouth daily. 750 mg   carbidopa-levodopa 10-100 MG tablet Commonly known as:  SINEMET IR Take 1 tablet by mouth 2 (two) times daily. What changed:    when to take this  additional instructions   cholecalciferol 1000 units tablet Commonly known as:  VITAMIN D Take 1,000 Units by mouth daily.   cloNIDine 0.1 MG tablet Commonly known as:  CATAPRES Take 0.1 mg by mouth 2 (two) times daily.   COLCRYS 0.6 MG tablet Generic drug:  colchicine Take 0.6 mg by mouth daily.   fish oil-omega-3 fatty acids 1000 MG capsule Take 3 g by mouth daily.   fluticasone furoate-vilanterol 100-25 MCG/INH Aepb Commonly known as:  BREO ELLIPTA Inhale 1 puff into the lungs daily. Start taking on:  08/24/2017 Replaces:  ARNUITY ELLIPTA 100 MCG/ACT Aepb   guaifenesin 400 MG Tabs tablet Commonly known as:  HUMIBID E Take 400 mg by mouth 2 (two) times daily as needed (congestion).   HYDROcodone-acetaminophen 5-325 MG tablet Commonly known as:  NORCO/VICODIN Take 1 tablet by mouth 3 (three) times daily as needed (pain).   levothyroxine 200 MCG tablet Commonly known as:  SYNTHROID,  LEVOTHROID Take 200 mcg by mouth daily before breakfast.   losartan-hydrochlorothiazide 100-25 MG tablet Commonly known as:  HYZAAR Take 1 tablet by mouth daily.   metFORMIN 500 MG tablet Commonly known as:  GLUCOPHAGE Take 1 tablet (500 mg total) by mouth 2 (two) times daily with a meal. Start taking on:  08/28/2017 What changed:    See the new instructions.  These instructions start on 08/28/2017. If you are unsure what to do until then, ask your doctor or other care provider.   metoprolol tartrate 50 MG tablet Commonly known as:  LOPRESSOR Take 50 mg by mouth 2 (two) times daily.   multivitamin tablet Take 1 tablet by mouth daily.   nitroGLYCERIN 0.4 MG SL tablet Commonly known as:  NITROSTAT Place 0.4 mg every 5 (five) minutes as needed under the tongue for chest pain.   oxymetazoline 0.05 % nasal spray Commonly known as:  AFRIN Place 2 sprays into the nose 2 (two) times daily as needed for congestion.   pantoprazole 40 MG tablet Commonly known as:  PROTONIX Take 40 mg by mouth every evening.  predniSONE 10 MG tablet Commonly known as:  DELTASONE Take 4 tablets for 3 days; Take 3 tablets for 4 days; Take 2 tablets for 3 days; Take 1 tablet for 4 days   rizatriptan 10 MG tablet Commonly known as:  MAXALT Take 10 mg by mouth as needed for migraine. May repeat in 2 hours if needed   rOPINIRole 1 MG tablet Commonly known as:  REQUIP TAKE 1 TABLET BY MOUTH IN THE MORNING AND 4 TABLETS AT BEDTIME What changed:  See the new instructions.   simvastatin 40 MG tablet Commonly known as:  ZOCOR   TRAVATAN Z 0.004 % Soln ophthalmic solution Generic drug:  Travoprost (BAK Free) Place 1 drop into both eyes.   umeclidinium bromide 62.5 MCG/INH Aepb Commonly known as:  INCRUSE ELLIPTA Inhale 1 puff into the lungs daily. Start taking on:  08/24/2017   VENTOLIN HFA 108 (90 Base) MCG/ACT inhaler Generic drug:  albuterol Inhale 1-2 puffs into the lungs every 4 (four) hours  as needed for wheezing or shortness of breath.      Contact information for after-discharge care    Destination    [facility] .   Service:  Skilled Nursing Contact information: [place] [phone]             No Known Allergies  Consultations:  None   Procedures/Studies: Dg Chest 2 View  Result Date: 08/19/2017 CLINICAL DATA:  77 year old male with history of pneumonia. Follow-up study. EXAM: CHEST  2 VIEW COMPARISON:  Chest x-ray 08/18/2017. FINDINGS: Small left pleural effusion with probable subsegmental atelectasis in the left lung base. Right lung is clear. No definite consolidative airspace disease. No right pleural effusion. No pneumothorax. No evidence of pulmonary edema. Heart size is borderline enlarged. Upper mediastinal contours are within normal limits. Aortic atherosclerosis. Surgical clips at the level of the thoracic inlet from prior thyroidectomy. IMPRESSION: 1. Low lung volumes with subsegmental atelectasis in the left lower lobe and small left pleural effusion. 2. Aortic atherosclerosis. Electronically Signed   By: Vinnie Langton M.D.   On: 08/19/2017 10:05   Dg Chest 2 View  Result Date: 08/18/2017 CLINICAL DATA:  Cough, congestion, shortness of breath EXAM: CHEST  2 VIEW COMPARISON:  06/20/2017 FINDINGS: Bilateral diffuse interstitial thickening and peribronchial cuffing most concerning for bronchitis. There is no pleural effusion or pneumothorax. The heart and mediastinal contours are unremarkable. The osseous structures are unremarkable. IMPRESSION: Bilateral diffuse interstitial thickening and peribronchial cuffing most concerning for bronchitis. Electronically Signed   By: Kathreen Devoid   On: 08/18/2017 13:36   Ct Angio Chest Pe W/cm &/or Wo Cm  Result Date: 08/18/2017 CLINICAL DATA:  Shortness of breath. EXAM: CT ANGIOGRAPHY CHEST WITH CONTRAST TECHNIQUE: Multidetector CT imaging of the  chest was performed using the standard protocol during bolus administration of intravenous contrast. Multiplanar CT image reconstructions and MIPs were obtained to evaluate the vascular anatomy. CONTRAST:  80 mL ISOVUE-370 IOPAMIDOL (ISOVUE-370) INJECTION 76% COMPARISON:  CT scan of January 03, 2017. FINDINGS: Cardiovascular: Satisfactory opacification of the pulmonary arteries to the segmental level. No evidence of pulmonary embolism. Normal heart size. No pericardial effusion. Atherosclerosis of thoracic aorta is noted without dissection. 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 Mediastinum/Nodes: Status post thyroidectomy. Esophagus is unremarkable. No mediastinal adenopathy is noted. Lungs/Pleura: No pneumothorax is noted. Mild left pleural effusion is noted with adjacent left lower lobe atelectasis. Bilateral upper lobe opacities are noted most consistent with subsegmental  atelectasis. Upper Abdomen: No acute abnormality. Musculoskeletal: No chest wall abnormality. No acute or significant osseous findings. Review of the MIP images confirms the above findings. IMPRESSION: No definite evidence of pulmonary embolus. 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 Recommend annual imaging followup by CTA or MRA. This recommendation follows 2010 ACCF/AHA/AATS/ACR/ASA/SCA/SCAI/SIR/STS/SVM Guidelines for the Diagnosis and Management of Patients with Thoracic Aortic Disease. Circulation. 2010; 121: Z124-P809. Mild left pleural effusion with adjacent left lower lobe atelectasis. Bilateral upper lobe subsegmental atelectasis is noted as well. Aortic Atherosclerosis (ICD10-I70.0). Electronically Signed   By: Marijo Conception, M.D.   On: 08/18/2017 15:34     Discharge Exam: Vitals:   08/23/17 0812 08/23/17 0847  BP:  130/80  Pulse:    Resp:    Temp:    SpO2: 94%    Vitals:   08/23/17 0500 08/23/17 0612 08/23/17 0812 08/23/17 0847  BP:  118/72  130/80  Pulse:  69    Resp:  20    Temp:  (!) 97.5 F (36.4  C)    TempSrc:  Axillary    SpO2:  96% 94%   Weight: 93.2 kg (205 lb 7.5 oz)     Height:        General: Pt is alert, awake, not in acute distress Cardiovascular: RRR, S1/S2 +, no rubs, no gallops Respiratory: CTA bilaterally, no wheezing, no rhonchi Abdominal: Soft, NT, ND, bowel sounds + Extremities: no edema, no cyanosis  The results of significant diagnostics from this hospitalization (including imaging, microbiology, ancillary and laboratory) are listed below for reference.     Microbiology: Recent Results (from the past 240 hour(s))  Blood culture (routine x 2)     Status: Abnormal   Collection Time: 08/18/17  3:47 PM  Result Value Ref Range Status   Specimen Description   Final    BLOOD LEFT HAND Performed at [facility], [place] [address], [place], [place] [address]    Special Requests   Final    BOTTLES DRAWN AEROBIC AND ANAEROBIC Blood Culture adequate volume Performed at [facility] [address] [place], [place] [address]    Culture  Setup Time   Final    GRAM POSITIVE COCCI IN CLUSTERS IN BOTH AEROBIC AND ANAEROBIC BOTTLES CRITICAL RESULT CALLED TO, READ BACK BY AND VERIFIED WITH: TGREEN,PHARMD @1748  08/19/17 BY LHOWARD Performed at [facility], [place] [address], [place], [place] [address]    Culture STAPHYLOCOCCUS EPIDERMIDIS (A)  Final   Report Status 08/21/2017 FINAL  Final   Organism ID, Bacteria STAPHYLOCOCCUS EPIDERMIDIS  Final      Susceptibility   Staphylococcus epidermidis - MIC*    CIPROFLOXACIN >=8 RESISTANT Resistant     ERYTHROMYCIN <=0.25 SENSITIVE Sensitive     GENTAMICIN <=0.5 SENSITIVE Sensitive     OXACILLIN >=4 RESISTANT Resistant     TETRACYCLINE 2 SENSITIVE Sensitive     VANCOMYCIN 2 SENSITIVE Sensitive     TRIMETH/SULFA <=10 SENSITIVE Sensitive     CLINDAMYCIN <=0.25 SENSITIVE Sensitive     RIFAMPIN <=0.5 SENSITIVE Sensitive     Inducible Clindamycin NEGATIVE Sensitive     *  STAPHYLOCOCCUS EPIDERMIDIS  Blood Culture ID Panel (Reflexed)     Status: Abnormal   Collection Time: 08/18/17  3:47 PM  Result Value Ref Range Status   Enterococcus species NOT DETECTED NOT DETECTED Final   Listeria monocytogenes NOT DETECTED NOT DETECTED Final   Staphylococcus species DETECTED (A) NOT DETECTED Final    Comment: Methicillin (  oxacillin) resistant coagulase negative staphylococcus. Possible blood culture contaminant (unless isolated from more than one blood culture draw or clinical case suggests pathogenicity). No antibiotic treatment is indicated for blood  culture contaminants. CRITICAL RESULT CALLED TO, READ BACK BY AND VERIFIED WITH: TGREEN,PHARMD @1748  08/19/17 BY LHOWARD    Staphylococcus aureus NOT DETECTED NOT DETECTED Final   Methicillin resistance DETECTED (A) NOT DETECTED Final    Comment: CRITICAL RESULT CALLED TO, READ BACK BY AND VERIFIED WITH: TGREEN,PHARM @1743  08/19/17 BY LHOWARD    Streptococcus species NOT DETECTED NOT DETECTED Final   Streptococcus agalactiae NOT DETECTED NOT DETECTED Final   Streptococcus pneumoniae NOT DETECTED NOT DETECTED Final   Streptococcus pyogenes NOT DETECTED NOT DETECTED Final   Acinetobacter baumannii NOT DETECTED NOT DETECTED Final   Enterobacteriaceae species NOT DETECTED NOT DETECTED Final   Enterobacter cloacae complex NOT DETECTED NOT DETECTED Final   Escherichia coli NOT DETECTED NOT DETECTED Final   Klebsiella oxytoca NOT DETECTED NOT DETECTED Final   Klebsiella pneumoniae NOT DETECTED NOT DETECTED Final   Proteus species NOT DETECTED NOT DETECTED Final   Serratia marcescens NOT DETECTED NOT DETECTED Final   Haemophilus influenzae NOT DETECTED NOT DETECTED Final   Neisseria meningitidis NOT DETECTED NOT DETECTED Final   Pseudomonas aeruginosa NOT DETECTED NOT DETECTED Final   Candida albicans NOT DETECTED NOT DETECTED Final   Candida glabrata NOT DETECTED NOT DETECTED Final   Candida krusei NOT DETECTED NOT  DETECTED Final   Candida parapsilosis NOT DETECTED NOT DETECTED Final   Candida tropicalis NOT DETECTED NOT DETECTED Final    Comment: Performed at [facility], [place]. [address] [place], [place] [address]  Blood culture (routine x 2)     Status: Abnormal   Collection Time: 08/18/17  3:52 PM  Result Value Ref Range Status   Specimen Description   Final    BLOOD LEFT WRIST Performed at [facility] [address] [place], [place] [address]    Special Requests   Final    BOTTLES DRAWN AEROBIC AND ANAEROBIC Blood Culture adequate volume Performed at [facility] [address], [place], [place] [address]    Culture  Setup Time   Final    IN BOTH AEROBIC AND ANAEROBIC BOTTLES GRAM POSITIVE COCCI IN PAIRS IN CLUSTERS CRITICAL VALUE NOTED.  VALUE IS CONSISTENT WITH PREVIOUSLY REPORTED AND CALLED VALUE.    Culture (A)  Final    STAPHYLOCOCCUS EPIDERMIDIS SUSCEPTIBILITIES PERFORMED ON PREVIOUS CULTURE WITHIN THE LAST 5 DAYS. Performed at [facility], [place] [address] [place], [place] [address]    Report Status 08/21/2017 FINAL  Final  MRSA PCR Screening     Status: Abnormal   Collection Time: 08/19/17  8:06 AM  Result Value Ref Range Status   MRSA by PCR POSITIVE (A) NEGATIVE Final    Comment:        The GeneXpert MRSA Assay (FDA approved for NASAL specimens only), is one component of a comprehensive MRSA colonization surveillance program. It is not intended to diagnose MRSA infection nor to guide or monitor treatment for MRSA infections. CRITICAL RESULT CALLED TO, READ BACK BY AND VERIFIED WITH: CHEEK,K AT [place] [id number] BY HOOKER,B Performed at [facility], [place] [address], [place], [place] [address]   Culture, blood (routine x 2)     Status: None (Preliminary result)   Collection Time: 08/20/17 12:17 PM  Result Value Ref Range Status   Specimen Description   Final  BLOOD LEFT ARM Performed at [facility], [place] [address], [place], [place] [address]    Special Requests   Final    BOTTLES DRAWN AEROBIC ONLY Blood Culture adequate volume Performed at [facility] [address] [place], [place] [address]    Culture   Final    NO GROWTH 3 DAYS Performed at [facility], [place] [address], [place], [place] [address]    Report Status PENDING  Incomplete  Culture, blood (routine x 2)     Status: None (Preliminary result)   Collection Time: 08/20/17 12:17 PM  Result Value Ref Range Status   Specimen Description   Final    BLOOD RIGHT ARM Performed at [facility], [place] [address], [place], [place] [address]    Special Requests   Final    IN PEDIATRIC BOTTLE Blood Culture adequate volume Performed at [facility] [address] [place], [place] [address]    Culture   Final    NO GROWTH 3 DAYS Performed at [facility], [place] [address], [place],  [address]    Report Status PENDING  Incomplete     Labs: BNP (last 3 results) Recent Labs    08/18/17 1230  BNP 41.3   Basic Metabolic Panel: Recent Labs  Lab 08/18/17 1230 08/19/17 0423 08/20/17 0512 08/21/17 0538 08/22/17 0601  NA 143 142 144 142 142  K 3.0* 3.5 3.6 3.1* 3.1*  CL 101 104 104 101 101  CO2 26 28 32 30 29  GLUCOSE 112* 110* 101* 93 101*  BUN 32* 25* 20 19 19   CREATININE 0.98 0.81 0.79 0.83 0.79  CALCIUM 9.0 8.5* 8.5* 8.4* 8.3*  MG  --   --   --   --  1.9   Liver Function Tests: Recent Labs  Lab 08/18/17 1230  AST 28  ALT 20  ALKPHOS 67  BILITOT 0.6  PROT 6.7  ALBUMIN 3.2*   No results for input(s): LIPASE, AMYLASE in the last 168 hours. No results for input(s): AMMONIA in the last 168 hours. CBC: Recent Labs  Lab 08/18/17 1230 08/19/17 0423 08/21/17 0538  WBC 13.1* 10.1 9.2  NEUTROABS 10.7*  --  5.6  HGB 13.9 12.3* 12.0*  HCT 41.0 37.5* 36.6*  MCV 88.4 91.0 89.7  PLT 301 297 333   Cardiac Enzymes: Recent Labs  Lab  08/18/17 1230  TROPONINI <0.03   BNP: Invalid input(s): POCBNP CBG: Recent Labs  Lab 08/18/17 1303 08/19/17 0757 08/20/17 0840 08/21/17 0742 08/23/17 0717  GLUCAP 93 97 151* 101* 104*   D-Dimer No results for input(s): DDIMER in the last 72 hours. Hgb A1c No results for input(s): HGBA1C in the last 72 hours. Lipid Profile No results for input(s): CHOL, HDL, LDLCALC, TRIG, CHOLHDL, LDLDIRECT in the last 72 hours. Thyroid function studies No results for input(s): TSH, T4TOTAL, T3FREE, THYROIDAB in the last 72 hours.  Invalid input(s): FREET3 Anemia work up No results for input(s): VITAMINB12, FOLATE, FERRITIN, TIBC, IRON, RETICCTPCT in the last 72 hours. Urinalysis    Component Value Date/Time   COLORURINE YELLOW 06/20/2017 1129   APPEARANCEUR TURBID (A) 06/20/2017 1129   LABSPEC 1.020 06/20/2017 1129   PHURINE 5.5 06/20/2017 1129   GLUCOSEU NEGATIVE 06/20/2017 1129   HGBUR TRACE (A) 06/20/2017 1129   BILIRUBINUR NEGATIVE 06/20/2017 1129   KETONESUR NEGATIVE 06/20/2017 1129   PROTEINUR NEGATIVE 06/20/2017 1129   UROBILINOGEN 1.0 09/09/2010 1045  NITRITE POSITIVE (A) 06/20/2017 1129   LEUKOCYTESUR LARGE (A) 06/20/2017 1129   Sepsis Labs Invalid input(s): PROCALCITONIN,  WBC,  LACTICIDVEN Microbiology Recent Results (from the past 240 hour(s))  Blood culture (routine x 2)     Status: Abnormal   Collection Time: 08/18/17  3:47 PM  Result Value Ref Range Status   Specimen Description   Final    BLOOD LEFT HAND Performed at [facility] [address] [place], [place] [address]    Special Requests   Final    BOTTLES DRAWN AEROBIC AND ANAEROBIC Blood Culture adequate volume Performed at [facility] [address], [place], [place] [address]    Culture  Setup Time   Final    GRAM POSITIVE COCCI IN CLUSTERS IN BOTH AEROBIC AND ANAEROBIC BOTTLES CRITICAL RESULT CALLED TO, READ BACK BY AND VERIFIED WITH: TGREEN,PHARMD @1748   08/19/17 BY LHOWARD Performed at [facility], [place] [address], [place], [place] [address]    Culture STAPHYLOCOCCUS EPIDERMIDIS (A)  Final   Report Status 08/21/2017 FINAL  Final   Organism ID, Bacteria STAPHYLOCOCCUS EPIDERMIDIS  Final      Susceptibility   Staphylococcus epidermidis - MIC*    CIPROFLOXACIN >=8 RESISTANT Resistant     ERYTHROMYCIN <=0.25 SENSITIVE Sensitive     GENTAMICIN <=0.5 SENSITIVE Sensitive     OXACILLIN >=4 RESISTANT Resistant     TETRACYCLINE 2 SENSITIVE Sensitive     VANCOMYCIN 2 SENSITIVE Sensitive     TRIMETH/SULFA <=10 SENSITIVE Sensitive     CLINDAMYCIN <=0.25 SENSITIVE Sensitive     RIFAMPIN <=0.5 SENSITIVE Sensitive     Inducible Clindamycin NEGATIVE Sensitive     * STAPHYLOCOCCUS EPIDERMIDIS  Blood Culture ID Panel (Reflexed)     Status: Abnormal   Collection Time: 08/18/17  3:47 PM  Result Value Ref Range Status   Enterococcus species NOT DETECTED NOT DETECTED Final   Listeria monocytogenes NOT DETECTED NOT DETECTED Final   Staphylococcus species DETECTED (A) NOT DETECTED Final    Comment: Methicillin (oxacillin) resistant coagulase negative staphylococcus. Possible blood culture contaminant (unless isolated from more than one blood culture draw or clinical case suggests pathogenicity). No antibiotic treatment is indicated for blood  culture contaminants. CRITICAL RESULT CALLED TO, READ BACK BY AND VERIFIED WITH: TGREEN,PHARMD @1748  08/19/17 BY LHOWARD    Staphylococcus aureus NOT DETECTED NOT DETECTED Final   Methicillin resistance DETECTED (A) NOT DETECTED Final    Comment: CRITICAL RESULT CALLED TO, READ BACK BY AND VERIFIED WITH: TGREEN,PHARM @1743  08/19/17 BY LHOWARD    Streptococcus species NOT DETECTED NOT DETECTED Final   Streptococcus agalactiae NOT DETECTED NOT DETECTED Final   Streptococcus pneumoniae NOT DETECTED NOT DETECTED Final   Streptococcus pyogenes NOT DETECTED NOT DETECTED Final   Acinetobacter baumannii NOT  DETECTED NOT DETECTED Final   Enterobacteriaceae species NOT DETECTED NOT DETECTED Final   Enterobacter cloacae complex NOT DETECTED NOT DETECTED Final   Escherichia coli NOT DETECTED NOT DETECTED Final   Klebsiella oxytoca NOT DETECTED NOT DETECTED Final   Klebsiella pneumoniae NOT DETECTED NOT DETECTED Final   Proteus species NOT DETECTED NOT DETECTED Final   Serratia marcescens NOT DETECTED NOT DETECTED Final   Haemophilus influenzae NOT DETECTED NOT DETECTED Final   Neisseria meningitidis NOT DETECTED NOT DETECTED Final   Pseudomonas aeruginosa NOT DETECTED NOT DETECTED Final   Candida albicans NOT DETECTED NOT DETECTED Final   Candida glabrata NOT DETECTED NOT DETECTED Final   Candida krusei NOT DETECTED NOT  DETECTED Final   Candida parapsilosis NOT DETECTED NOT DETECTED Final   Candida tropicalis NOT DETECTED NOT DETECTED Final    Comment: Performed at [facility], [place] [address] [place], [place] [address]  Blood culture (routine x 2)     Status: Abnormal   Collection Time: 08/18/17  3:52 PM  Result Value Ref Range Status   Specimen Description   Final    BLOOD LEFT WRIST Performed at [facility] [address] [place], [place] [address]    Special Requests   Final    BOTTLES DRAWN AEROBIC AND ANAEROBIC Blood Culture adequate volume Performed at [facility] [address], [place], [place] [address]    Culture  Setup Time   Final    IN BOTH AEROBIC AND ANAEROBIC BOTTLES GRAM POSITIVE COCCI IN PAIRS IN CLUSTERS CRITICAL VALUE NOTED.  VALUE IS CONSISTENT WITH PREVIOUSLY REPORTED AND CALLED VALUE.    Culture (A)  Final    STAPHYLOCOCCUS EPIDERMIDIS SUSCEPTIBILITIES PERFORMED ON PREVIOUS CULTURE WITHIN THE LAST 5 DAYS. Performed at [facility], [place] [address] [place], [place] [address]    Report Status 08/21/2017 FINAL  Final  MRSA PCR Screening     Status: Abnormal   Collection Time: 08/19/17  8:06 AM  Result Value  Ref Range Status   MRSA by PCR POSITIVE (A) NEGATIVE Final    Comment:        The GeneXpert MRSA Assay (FDA approved for NASAL specimens only), is one component of a comprehensive MRSA colonization surveillance program. It is not intended to diagnose MRSA infection nor to guide or monitor treatment for MRSA infections. CRITICAL RESULT CALLED TO, READ BACK BY AND VERIFIED WITH: CHEEK,K AT [place] [id number] BY HOOKER,B Performed at [facility], [place] [address], [place], [place] [address]   Culture, blood (routine x 2)     Status: None (Preliminary result)   Collection Time: 08/20/17 12:17 PM  Result Value Ref Range Status   Specimen Description   Final    BLOOD LEFT ARM Performed at [facility], [place] [address] [place], [place] [address]    Special Requests   Final    BOTTLES DRAWN AEROBIC ONLY Blood Culture adequate volume Performed at [facility] [address] [place], [place] [address]    Culture   Final    NO GROWTH 3 DAYS Performed at [facility], [place] [address] [place], [place] [address]    Report Status PENDING  Incomplete  Culture, blood (routine x 2)     Status: None (Preliminary result)   Collection Time: 08/20/17 12:17 PM  Result Value Ref Range Status   Specimen Description   Final    BLOOD RIGHT ARM Performed at [facility], [place] [address], [place], [place] [address]    Special Requests   Final    IN PEDIATRIC BOTTLE Blood Culture adequate volume Performed at [facility] [address] [place], [place] [address]    Culture   Final    NO GROWTH 3 DAYS Performed at [facility], [place] [address], [place], [place] [address]    Report Status PENDING  Incomplete     Time coordinating discharge: 32 minutes  SIGNED:  Chipper Oman, MD  [facility] 08/23/2017, 2:53 PM  Pager please text page via  [email or web address]  Note - This record has been created using [employer]. Chart creation  errors have been sought, but may not always have been located. Such creation errors do not reflect on the standard of medical care.

## 2017-08-23 NOTE — Progress Notes (Signed)
Called report to [facility]. Gave report to [facility]. Informed her that patient is going to [facility] first to have MRI and then transferred to SNF. MD Quincy Simmonds is aware of the plan.

## 2017-08-23 NOTE — Clinical Social Work Placement (Addendum)
SNF authorization received [id number] 3 days, 1st review 3/9 RVB 543 minutes per week   CSW has discussed d/c plan to [facility] by [facility] then to [facility] by personal car transport.   CSW confirmed with SNF liaison they are agreeable to patient arriving in car after imaging appointment.    Nurse given number to call report.  Rm. [phone]   Update: [facility] will transport from [place] after the procedure is complete.   CLINICAL SOCIAL WORK PLACEMENT  NOTE  Date:  08/23/2017  Patient Details  Name: [name] MRN: [id number] Date of Birth: [birth year]  Clinical Social Work is seeking post-discharge placement for this patient at the [place] level of care (*CSW will initial, date and re-position this form in  chart as items are completed):  Yes   Patient/family provided with [facility] Work Department's list of facilities offering this level of care within the geographic area requested by the patient (or if unable, by the patient's family).  Yes   Patient/family informed of their freedom to choose among providers that offer the needed level of care, that participate in Medicare, Medicaid or managed care program needed by the patient, have an available bed and are willing to accept the patient.  Yes   Patient/family informed of [facility] ownership interest in [facility] and [facility], as well as of the fact that they are under no obligation to receive care at these facilities.  PASRR submitted to EDS on       PASRR number received on       Existing PASRR number confirmed on 08/21/17     FL2 transmitted to all facilities in geographic area requested by pt/family on       FL2 transmitted to all facilities within larger geographic area on       Patient informed that his/her managed care company has contracts with or will negotiate with certain facilities, including the following:  [facility]     Yes    Patient/family informed of bed offers received.  Patient chooses bed at [facility]     Physician recommends and patient chooses bed at      Patient to be transferred to [facility] then SNF. ) on 08/23/17.  Patient to be transferred to facility by [facility]     Patient family notified on 08/23/17 of transfer.  Name of family member notified:  Spouse at bedside     PHYSICIAN Please prepare priority discharge summary, including medications, Please prepare prescriptions     Additional Comment:    _______________________________________________ Lia Hopping, LCSW 08/23/2017, 2:07 PM

## 2017-08-24 ENCOUNTER — Ambulatory Visit
Admission: RE | Admit: 2017-08-24 | Discharge: 2017-08-24 | Disposition: A | Discharge status: Home / Self Care | Payer: Medicare Other | Source: Ambulatory Visit | Attending: Neurology | Admitting: Neurology

## 2017-08-24 ENCOUNTER — Telehealth: Payer: Self-pay | Admitting: Neurology

## 2017-08-24 DIAGNOSIS — R269 Unspecified abnormalities of gait and mobility: Secondary | ICD-10-CM

## 2017-08-24 DIAGNOSIS — R292 Abnormal reflex: Secondary | ICD-10-CM

## 2017-08-24 DIAGNOSIS — R3989 Other symptoms and signs involving the genitourinary system: Secondary | ICD-10-CM

## 2017-08-24 DIAGNOSIS — R29898 Other symptoms and signs involving the musculoskeletal system: Secondary | ICD-10-CM

## 2017-08-24 DIAGNOSIS — R399 Unspecified symptoms and signs involving the genitourinary system: Secondary | ICD-10-CM

## 2017-08-24 DIAGNOSIS — R198 Other specified symptoms and signs involving the digestive system and abdomen: Secondary | ICD-10-CM

## 2017-08-24 LAB — PROTEIN, CSF: Total Protein, CSF: 226 mg/dL — ABNORMAL HIGH (ref 15–60)

## 2017-08-24 LAB — GLUCOSE, CSF: Glucose, CSF: 48 mg/dL (ref 40–80)

## 2017-08-24 LAB — CSF CELL COUNT WITH DIFFERENTIAL
RBC Count, CSF: 1550 cells/uL — ABNORMAL HIGH (ref 0–10)
WBC, CSF: 2 cells/uL (ref 0–5)

## 2017-08-24 IMAGING — XA DG FLUORO GUIDE LUMBAR PUNCTURE
1 series · 1 of 1 positions shown · non-contrast
Comparison: none

CLINICAL DATA: Bilateral lower extremity weakness. Areflexia.
Alteration in bowel and bladder function. Evaluation for CIDP.

[Series 1: ortho standard · 1 of 1 slices shown]
[im 1/1]
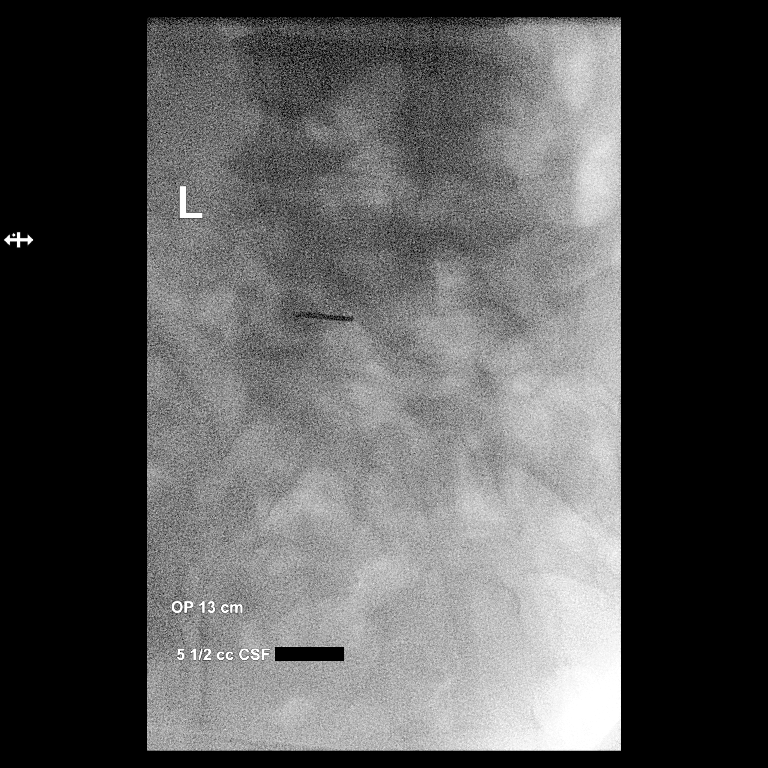

[1 of 1 positions shown; findings below may reference images not displayed]

EXAM:
DIAGNOSTIC LUMBAR PUNCTURE UNDER FLUOROSCOPIC GUIDANCE

FLUOROSCOPY TIME:  Fluoroscopy Time:  14 seconds

Radiation Exposure Index (if provided by the fluoroscopic device):
93.78 microGray*m^2

Number of Acquired Spot Images: 0

PROCEDURE:
Informed consent was obtained from the patient prior to the
procedure, including potential complications of headache, allergy,
and pain. With the patient in the left lateral decubitus position,
the lower back was prepped with Betadine. 1% Lidocaine was used for
local anesthesia. Lumbar puncture was performed at the L5-S1 level
using a 3.5 inch 20 gauge needle via a left paramedian approach with
return of blood tinged CSF with an opening pressure of 13 cm water.
5.5 mL of CSF were obtained for laboratory studies. The patient
tolerated the procedure well and there were no apparent
complications.
IMPRESSION: Successful fluoroscopic guided lumbar puncture.

## 2017-08-24 NOTE — Discharge Instructions (Signed)

## 2017-08-24 NOTE — Progress Notes (Signed)
Upon pt arrival to [facility], Pt's blood pressure was 80's/40's. 02 sats 87-92% on RA. Pt states he feels "okay" and denies difficulty breathing. Denies dizziness or lightheadedness. Patient and wife elect to continue with the lumbar puncture, Dr Jeralyn Ruths aggreable. Pt tolerated procedure well. Per Dr Jeralyn Ruths no post procedure vitals or recovery time needed, pt to be transferred to [facility] stretcher and may leave. Upon departure pt is awake, alert and speaking in complete sentences.

## 2017-08-24 NOTE — Telephone Encounter (Signed)
Received a text message from call center. Apparently, [name] from [phone] [facility] called for this pt due to lab result. Reference # [id number] E.   I called back, a lady in [place] picked up the phone and told me to transfer to [facility] office. I asked for the number in case the call was dropped off. The called was then transferred but after a series of processes then no more sound. Call was dropped.   I called the number the lady gave to me earlier [phone] with extension [phone]. I called back, after key in extension #, there was no more sound. Call was dropped again.   I then looked at the pt chart briefly, it seems that he had LP today with RBC 1550, WBC 2, glucose 48 and protein 226. I think there was likely a traumatic tap to explain high RBC count. The elevated protein level likely related to combination of traumatic tap and cervical cord compression. Do not feel any emergency needs for intervention from CSF lab result per se. Will defer Dr. Brett Fairy for further management. Will send her this note.   Rosalin Hawking, MD PhD Stroke Neurology 08/24/2017 7:52 PM

## 2017-08-25 LAB — CULTURE, BLOOD (ROUTINE X 2)
CULTURE: NO GROWTH
CULTURE: NO GROWTH
SPECIAL REQUESTS: ADEQUATE
Special Requests: ADEQUATE

## 2017-08-27 ENCOUNTER — Other Ambulatory Visit: Payer: Self-pay | Admitting: Neurology

## 2017-08-27 ENCOUNTER — Telehealth: Payer: Self-pay | Admitting: Neurology

## 2017-08-27 DIAGNOSIS — M4802 Spinal stenosis, cervical region: Secondary | ICD-10-CM

## 2017-08-27 DIAGNOSIS — M50022 Cervical disc disorder at C5-C6 level with myelopathy: Secondary | ICD-10-CM

## 2017-08-27 NOTE — Telephone Encounter (Signed)
Agree, this was responded to.

## 2017-08-27 NOTE — Telephone Encounter (Signed)
Paged by [facility]. LP results. Discussed with Dr Brett Fairy earlier, she is aware of the abnormal values already resulted from 08/24/2017. New result: Culture prelim shows no organisms and no growth.

## 2017-08-28 ENCOUNTER — Telehealth: Payer: Self-pay | Admitting: Neurology

## 2017-08-28 ENCOUNTER — Other Ambulatory Visit: Payer: Self-pay | Admitting: Neurosurgery

## 2017-08-28 LAB — CSF CULTURE W GRAM STAIN
GRAM STAIN:: NONE SEEN
MICRO NUMBER:: 90299876
Result:: NO GROWTH
SPECIMEN QUALITY:: ADEQUATE

## 2017-08-28 NOTE — Telephone Encounter (Signed)
Patient's wife is calling to discuss choosing a neurosurgeon.

## 2017-08-28 NOTE — Telephone Encounter (Signed)
Called the wife back and spoke with her making sure she had received all the results from Dr Brett Fairy through their voicemail. Dr Dohmeier had called and went over all his results on the voicemail and encouraged the patient needs a urgent referral to neurosurgery. The referral has been placed. I went over this information with the wife and made her aware that it was listed as urgent. I informed her that the office should be contacting her to get an apt established for him. Mrs March questioned if it was necessary for the patient to follow up here and Dr Dohmeier stated that we could cancel the follow up apt here as the patient should be seen by neuro surgery. Pt's wife verbalized understanding. I encouraged [place] that if she needed [place] to call us at anytime.

## 2017-08-29 ENCOUNTER — Ambulatory Visit: Payer: Medicare Other | Admitting: Adult Health

## 2017-08-31 ENCOUNTER — Encounter (HOSPITAL_COMMUNITY): Payer: Self-pay | Admitting: *Deleted

## 2017-08-31 ENCOUNTER — Other Ambulatory Visit: Payer: Self-pay

## 2017-08-31 NOTE — Progress Notes (Addendum)
Anesthesia Chart Review: SAME DAY WORK-UP.  Patient is a 77 year old male scheduled for C3-4, C4-5, C5-6 ACDF on 09/03/17 by Dr. Kary Kos. Patient has been seeing orthopedics and neurology ambulatory problems with little control over his extremities and now with bowel and bladder incontinence. He was urgently referred by his neurologist following recent cervical MRI results (outlined below).   History includes former smoker ('07), HTN, DM2, GERD, Barrett's esophagus, PUD, CKD, OSA, RLS, thyroid cancer (s/p total thyroidectomy 03/14/06; post-surgical hypothyroidism), carotid artery disease (s/p left carotid endarterectomy 01/03/06), left TKA 08/09/10 with revision 09/09/10. 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 CAD was added to his history on 11/11/12 ([facility] day for carotid artery disease follow-up; Previous [facility] notes do not list CAD in his history. Wife reported no known CAD, although Nitro is listed on his med list--although not taking. Patient reportedly had cath many years ago but was not told of any issues.)  - Admission 08/18/17-08/23/17 for acute hypoxic respiratory failure due to COPD exacerbation. ([facility] noted O2 sat 89%, prompting HHRN visit. He developed worsening SOB and was sent to the ED.) Treated with antibiotics, bronchodilators, steroids, and short term oxygen. Blood cultures 2/2 grew Staphylococcus epidermidis (oxacillin, ciprofloxacin resistant). No signs of systemic infection, echo showed no vegetations, and repeat blood cultures showed no growth over 48 hours, so initial specimens thought to likely be contamination. ID did not recommend any additional antibiotics. He was discharged to SNF. Lung sounds documented as being clear to auscultation with good air movement. - Admission 06/20/17-06/23/17 for acute cystitis.  Providers: - PCP is Dr. [name] with [facility]).  - Neurologist is Dr. Asencion Partridge Dohmeier. Last seen on 08/09/17. She reported patient with  ambulatory problems and very little control over his lower extremities and has been wheelchair bound. He had now developed loss of bowel and bladder control. By her notes, recent EMG and NCV without evidence of upper and lower motor neuron dysfunction. Sensory motor neuropathy. Had low back pressure but no pain in his legs. She ordered a MRI of the brain, LP, and MRI C/T spines. He had previous lumbar MRI which could not completely explain patient's symptoms.  - Pulmonologist is Dr. Fenton Foy. Last visit with Rexene Edison, NP on 02/06/17. - Vascular surgeon is Dr. Adele Barthel with [facility]. Last visit with Wray Kearns, PA-C on 11/29/12. Two year follow-up recommended.   Meds include allopurinol, Sinemet, clonidine, fish oil, Breo Ellipta, Humibid E, Norco, levothyroxine, Hyzaar, metformin, Lopressor, Afrin nasal spray, Protonix, prednisone 10 mg taper (4 tabs X 3 days, 3 tabs X 4 days, 2 tabs X 3 days, 1 tab X 4 days), Maxalt, Requip, Zocor, Travatan ophthalmic, Incruse Ellipta, Ventolin HFA. Last ASA 08/27/17.  EKG 08/18/17: ST at 102 bpm, atrial premature complexes, right bundle branch block and left anterior fascicular block. RBBB pattern in V1 is new when compared to 08/03/10 tracing in [facility].  Echo 08/20/17: Study Conclusions - Left ventricle: The cavity size was normal. There was moderate   concentric hypertrophy. Systolic function was vigorous. The   estimated ejection fraction was in the range of 65% to 70%. Wall   motion was normal; there were no regional wall motion   abnormalities. Features are consistent with a pseudonormal left   ventricular filling pattern, with concomitant abnormal relaxation   and increased filling pressure (grade 2 diastolic dysfunction). - Aortic valve: There was mild regurgitation. - Mitral valve: Calcified annulus. Mild focal  calcification of the   anterior leaflet (medial segment(s)). - Left atrium: The atrium was moderately dilated. -  Atrial septum: There was increased thickness of the septum,   consistent with lipomatous hypertrophy. - Pulmonary arteries: PA peak pressure: 36 mm Hg (S). Impressions: - The right ventricular systolic pressure was increased consistent   with mild pulmonary hypertension.  Carotid U/S 11/29/12: Impression: 1.  Less than 40% right internal carotid artery stenosis. 2.  Patent left carotid endarterectomy site with less than 40% internal carotid artery stenosis.  CTA chest 08/18/17: IMPRESSION: - No definite evidence of pulmonary embolus. 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 Recommend annual imaging followup by CTA or MRA. This recommendation follows 2010 ACCF/AHA/AATS/ACR/ASA/SCA/SCAI/SIR/STS/SVM Guidelines for the Diagnosis and Management of Patients with Thoracic Aortic Disease. Circulation. 2010; 121: F093-A355. - Mild left pleural effusion with adjacent left lower lobe atelectasis. Bilateral upper lobe subsegmental atelectasis is noted as well. - Aortic Atherosclerosis (ICD10-I70.0).  CXR 08/19/17: FINDINGS: Small left pleural effusion with probable subsegmental atelectasis in the left lung base. Right lung is clear. No definite consolidative airspace disease. No right pleural effusion. No pneumothorax. No evidence of pulmonary edema. Heart size is borderline enlarged. Upper mediastinal contours are within normal limits. Aortic atherosclerosis. Surgical clips at the level of the thoracic inlet from prior thyroidectomy. IMPRESSION: 1. Low lung volumes with subsegmental atelectasis in the left lower lobe and small left pleural effusion. 2. Aortic atherosclerosis.  Spirometry 11/28/16: FVC 2.07 (58%), FEV1 1.60 (62%), FEV1/FVC 77% (106%), FEF25-75% 1.44 (78%).  Interpretation: Moderately severe restriction.   MRI cervical spine 08/23/17: IMPRESSION:  1.    At C3-C4, there is severe spinal stenosis (AP diameter 5 mm) causing spinal cord compression with myelopathic signal changes within the  spinal cord.  Additionally there is severe bilateral foraminal narrowing with probable C4 nerve root compression bilaterally. 2.    At C4-C5, there is moderate spinal stenosis with a small focus of T2 hyperintensity consistent with myelopathic signal changes. There is moderately severe foraminal narrowing bilaterally that could lead to compression of the bilateral C5 nerve roots. 3.    At C5-C6, there is severe spinal stenosis (AP diameter 5.8 mm) that appears to cause spinal cord compression towards the right.   There is moderately severe foraminal narrowing bilaterally with potential for bilateral C6 nerve root compression. 4.    At C6-C7, there is mild spinal stenosis. There does not appear to be spinal cord compression or nerve root compression at this level. 5.    Milder degenerative changes are noted at C2-C3, C7-T1 and T1-T2 that did not lead to spinal stenosis, cord compression or nerve root compression 6.    There is a normal enhancement pattern  MRI brain 08/23/17: IMPRESSION:   1.    Moderate generalized cortical atrophy. 2.    Minimal chronic microvascular ischemic change, typical for age. 3.    There are no acute findings and there is a normal enhancement pattern.   Labs from 08/18/17-08/22/17 noted. Last labs show Cr 0.79, glucose 101, K 3.1, H/H 12.0/36.6, PLT 333K. HIV screen non-reactive. Reportedly fasting CBGs ~ 90 - 105.   Patient has been seeing neurology for gait abnormality with BLE weakness and bowel and bladder incontinence. 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 He has been referred for urgent ACDF. He is also on Sinemet although I do not see history of Parkinson's mentioned. 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 In regards to his other  history, he was recently hospitalized for COPD exacerbation. Lungs were clear at discharge and he had been weaned from supplemental O2. It is unclear if he actually has known CAD, but his wife denied knowledge of this. 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 His recent echo showed normal LVEF and wall motion with grade 2 diastolic dysfunction and mild pulmonary hypertension. He is overdue for vascular surgery follow-up, but last U/S showed no significant ICA stenosis or restenosis. In review of 08/18/17 ED note and several Hospitalists' notes, I could not find documentation of who would be following patient's TAA, so I did leave a voice message with Claiborne Billings (nurse with Dr. Brigitte Pulse) who was covering messages for [facility] to forward to Dr. [name] for review and recommendations.    Discussed above with anesthesiologist Dr. Charolett Bumpers. Patient is a same day work-up with need for urgent ACDF, so he will have to be further evaluated on the day of surgery by his surgeon and anesthesiologist. Definitive plan at that time. Med list includes recent steroid taper. Could consider pulmonary consult while hospitalized if indicated.  George Hugh [facility] Short Stay Center/Anesthesiology Phone [phone] 08/31/2017 4:41 PM

## 2017-08-31 NOTE — Pre-Procedure Instructions (Signed)
   [name]  08/31/2017    [name] procedure is scheduled on Monday, 09/03/17 at 3:25 PM.   Report to [facility] Entrance "A" Admitting Office at 1:00 PM.   Call this number if you have problems the morning of surgery: [phone]   Remember:  Patient is not to eat food or drink liquids after 8:00 AM Monday. May have a light breakfast prior to 8:00 AM.  Take these medicines the morning of surgery with A SIP OF WATER: Carbidopa-Levodopa, Clonidine, Levothyroxine, Hydrocodone - if needed, Incruse Ellipta inhaler, Breo Ellipta inhaler, Ventolin inhaler - if needed (have pt bring this inhaler with him day of surgery) Pt is not to take his Metformin morning of surgery.  Please check pt's blood sugar when he wakes up Monday AM and every 2 hours until he leaves for the hospital.  If blood sugar is 70 or below, treat with 1/2 cup of clear juice (apple or cranberry) and recheck blood sugar 15 minutes after drinking juice. If blood sugar continues to be 70 or below, call the Short Stay department and ask to speak to a nurse.  Per order of Dr. Ambrose Pancoast, Anesthesiologist/ Lilia Pro, RN   Do not wear jewelry.  Do not wear lotions, powders, cologne or deodorant.  Men may shave face and neck.  Do not bring valuables to the hospital.  [facility] is not responsible for any belongings or valuables.  Contacts, dentures or bridgework may not be worn into surgery.  Leave your suitcase in the car.  After surgery it may be brought to your room.  For patients admitted to the hospital, discharge time will be determined by your treatment team. Any questions today, please call me, Lilia Pro, RN at [phone]

## 2017-08-31 NOTE — Progress Notes (Signed)
[facility] staff person called back and said that Mr. Corker last A1c was 6.3 on 04/19/17.

## 2017-08-31 NOTE — Progress Notes (Addendum)
I have spoken with pt's wife, [name] and [place], LPN at [facility] where pt is residing. Pt's wife states pt does not have cardiac history, his problems are more respiratory in nature. No recent chest pain. Pt was recently in hospital with low O2 sats. She states pt is not using O2 at the SNF. Pt is a type 2 diabetic. His fasting blood sugar is usually between 90-105. [name] cannot remember what his last A1C was. I have called Dr. Shon Baton office and left a message requesting that result. Aspirin and Fish Oil have held prior to surgery. Last dose of Aspirin was 08/27/17. I have faxed pre-op instructions to [place], [place] at [phone].

## 2017-09-03 ENCOUNTER — Other Ambulatory Visit: Payer: Self-pay

## 2017-09-03 ENCOUNTER — Inpatient Hospital Stay (HOSPITAL_COMMUNITY)
Admission: RE | Disposition: A | Discharge status: Skilled Nursing Facility | Payer: Self-pay | Source: Ambulatory Visit | Attending: Neurosurgery

## 2017-09-03 ENCOUNTER — Inpatient Hospital Stay (HOSPITAL_COMMUNITY): Payer: Medicare Other | Admitting: Vascular Surgery

## 2017-09-03 ENCOUNTER — Inpatient Hospital Stay (HOSPITAL_COMMUNITY): Payer: Medicare Other

## 2017-09-03 ENCOUNTER — Encounter (HOSPITAL_COMMUNITY): Payer: Self-pay | Admitting: *Deleted

## 2017-09-03 ENCOUNTER — Inpatient Hospital Stay (HOSPITAL_COMMUNITY)
Admission: RE | Admit: 2017-09-03 | Discharge: 2017-09-06 | DRG: 472 | Disposition: A | Discharge status: Skilled Nursing Facility | Payer: Medicare Other | Source: Ambulatory Visit | Attending: Neurosurgery | Admitting: Neurosurgery

## 2017-09-03 DIAGNOSIS — G2581 Restless legs syndrome: Secondary | ICD-10-CM | POA: Diagnosis present

## 2017-09-03 DIAGNOSIS — Z85850 Personal history of malignant neoplasm of thyroid: Secondary | ICD-10-CM | POA: Diagnosis not present

## 2017-09-03 DIAGNOSIS — I2510 Atherosclerotic heart disease of native coronary artery without angina pectoris: Secondary | ICD-10-CM | POA: Diagnosis present

## 2017-09-03 DIAGNOSIS — M4802 Spinal stenosis, cervical region: Secondary | ICD-10-CM | POA: Diagnosis present

## 2017-09-03 DIAGNOSIS — M50022 Cervical disc disorder at C5-C6 level with myelopathy: Secondary | ICD-10-CM | POA: Diagnosis present

## 2017-09-03 DIAGNOSIS — G4733 Obstructive sleep apnea (adult) (pediatric): Secondary | ICD-10-CM | POA: Diagnosis present

## 2017-09-03 DIAGNOSIS — E890 Postprocedural hypothyroidism: Secondary | ICD-10-CM | POA: Diagnosis present

## 2017-09-03 DIAGNOSIS — Z79890 Hormone replacement therapy (postmenopausal): Secondary | ICD-10-CM | POA: Diagnosis not present

## 2017-09-03 DIAGNOSIS — Z7984 Long term (current) use of oral hypoglycemic drugs: Secondary | ICD-10-CM | POA: Diagnosis not present

## 2017-09-03 DIAGNOSIS — J449 Chronic obstructive pulmonary disease, unspecified: Secondary | ICD-10-CM | POA: Diagnosis present

## 2017-09-03 DIAGNOSIS — M4712 Other spondylosis with myelopathy, cervical region: Secondary | ICD-10-CM | POA: Diagnosis present

## 2017-09-03 DIAGNOSIS — E1122 Type 2 diabetes mellitus with diabetic chronic kidney disease: Secondary | ICD-10-CM | POA: Diagnosis present

## 2017-09-03 DIAGNOSIS — M50821 Other cervical disc disorders at C4-C5 level: Secondary | ICD-10-CM | POA: Diagnosis present

## 2017-09-03 DIAGNOSIS — I272 Pulmonary hypertension, unspecified: Secondary | ICD-10-CM | POA: Diagnosis present

## 2017-09-03 DIAGNOSIS — Z7951 Long term (current) use of inhaled steroids: Secondary | ICD-10-CM | POA: Diagnosis not present

## 2017-09-03 DIAGNOSIS — I129 Hypertensive chronic kidney disease with stage 1 through stage 4 chronic kidney disease, or unspecified chronic kidney disease: Secondary | ICD-10-CM | POA: Diagnosis present

## 2017-09-03 DIAGNOSIS — K219 Gastro-esophageal reflux disease without esophagitis: Secondary | ICD-10-CM | POA: Diagnosis present

## 2017-09-03 DIAGNOSIS — G959 Disease of spinal cord, unspecified: Secondary | ICD-10-CM | POA: Diagnosis present

## 2017-09-03 DIAGNOSIS — Z87891 Personal history of nicotine dependence: Secondary | ICD-10-CM | POA: Diagnosis not present

## 2017-09-03 DIAGNOSIS — Z79899 Other long term (current) drug therapy: Secondary | ICD-10-CM | POA: Diagnosis not present

## 2017-09-03 DIAGNOSIS — N189 Chronic kidney disease, unspecified: Secondary | ICD-10-CM | POA: Diagnosis present

## 2017-09-03 DIAGNOSIS — M5412 Radiculopathy, cervical region: Secondary | ICD-10-CM | POA: Diagnosis present

## 2017-09-03 DIAGNOSIS — Z96652 Presence of left artificial knee joint: Secondary | ICD-10-CM | POA: Diagnosis present

## 2017-09-03 DIAGNOSIS — Z23 Encounter for immunization: Secondary | ICD-10-CM

## 2017-09-03 DIAGNOSIS — Z419 Encounter for procedure for purposes other than remedying health state, unspecified: Secondary | ICD-10-CM

## 2017-09-03 HISTORY — DX: Dyspnea, unspecified: R06.00

## 2017-09-03 HISTORY — DX: Headache: R51

## 2017-09-03 HISTORY — DX: Unspecified osteoarthritis, unspecified site: M19.90

## 2017-09-03 HISTORY — DX: Thoracic aortic aneurysm, without rupture: I71.2

## 2017-09-03 HISTORY — PX: ANTERIOR CERVICAL DECOMP/DISCECTOMY FUSION: SHX1161

## 2017-09-03 HISTORY — DX: Other specified postprocedural states: Z98.890

## 2017-09-03 HISTORY — DX: Aneurysm of the ascending aorta, without rupture: I71.21

## 2017-09-03 HISTORY — DX: Bronchitis, not specified as acute or chronic: J40

## 2017-09-03 HISTORY — DX: Constipation, unspecified: K59.00

## 2017-09-03 HISTORY — DX: Pneumonia, unspecified organism: J18.9

## 2017-09-03 HISTORY — DX: Headache, unspecified: R51.9

## 2017-09-03 LAB — GLUCOSE, CAPILLARY
GLUCOSE-CAPILLARY: 81 mg/dL (ref 65–99)
Glucose-Capillary: 89 mg/dL (ref 65–99)
Glucose-Capillary: 151 mg/dL — ABNORMAL HIGH (ref 65–99)
Glucose-Capillary: 175 mg/dL — ABNORMAL HIGH (ref 65–99)

## 2017-09-03 LAB — HEMOGLOBIN A1C
HEMOGLOBIN A1C: 5.6 % (ref 4.8–5.6)
MEAN PLASMA GLUCOSE: 114.02 mg/dL

## 2017-09-03 IMAGING — RF DG C-ARM 61-120 MIN
1 series · 2 of 2 positions shown · non-contrast
Comparison: MRI [DATE]

CLINICAL DATA: C3 through C6 ACDF

EXAM:
CERVICAL SPINE - 2-3 VIEW; DG C-ARM 61-120 MIN

[Series 1: run · 2 of 2 slices shown]
[im 1/2]
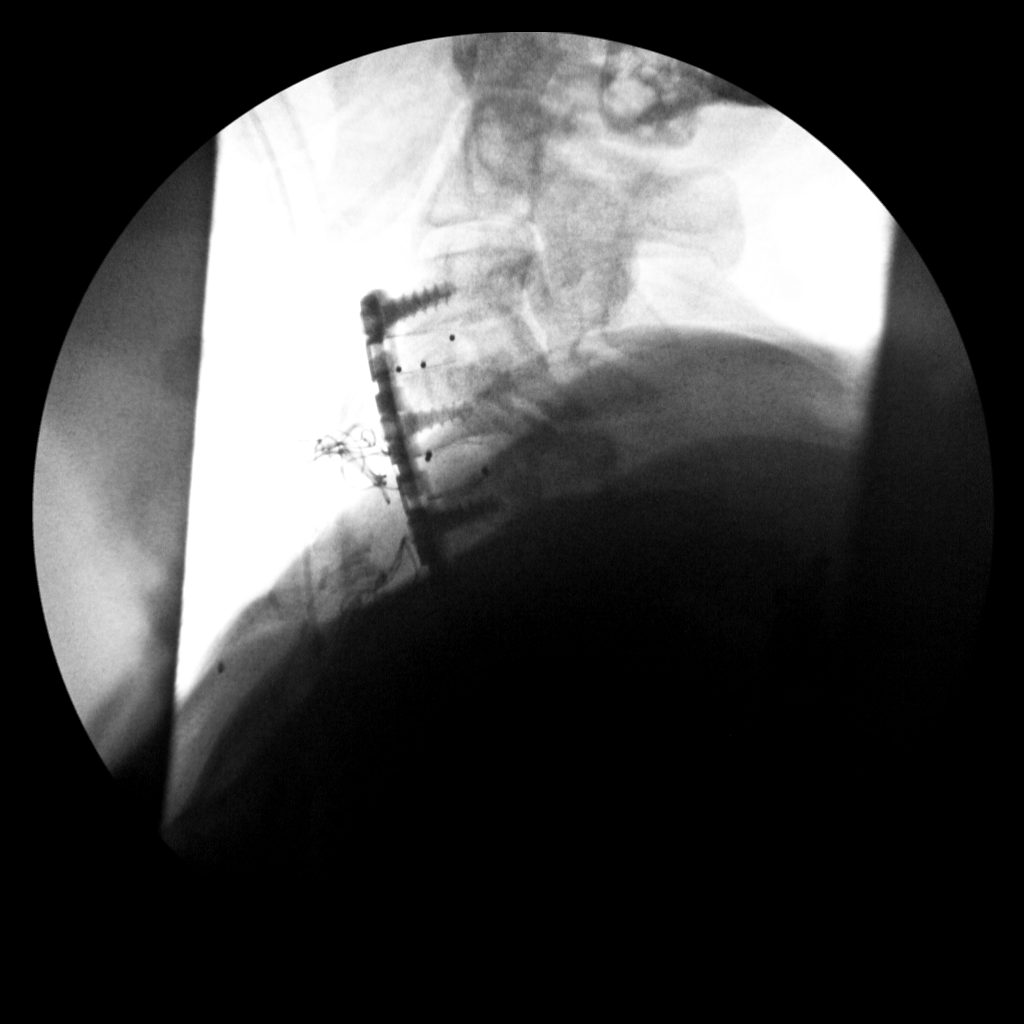
[im 2/2]
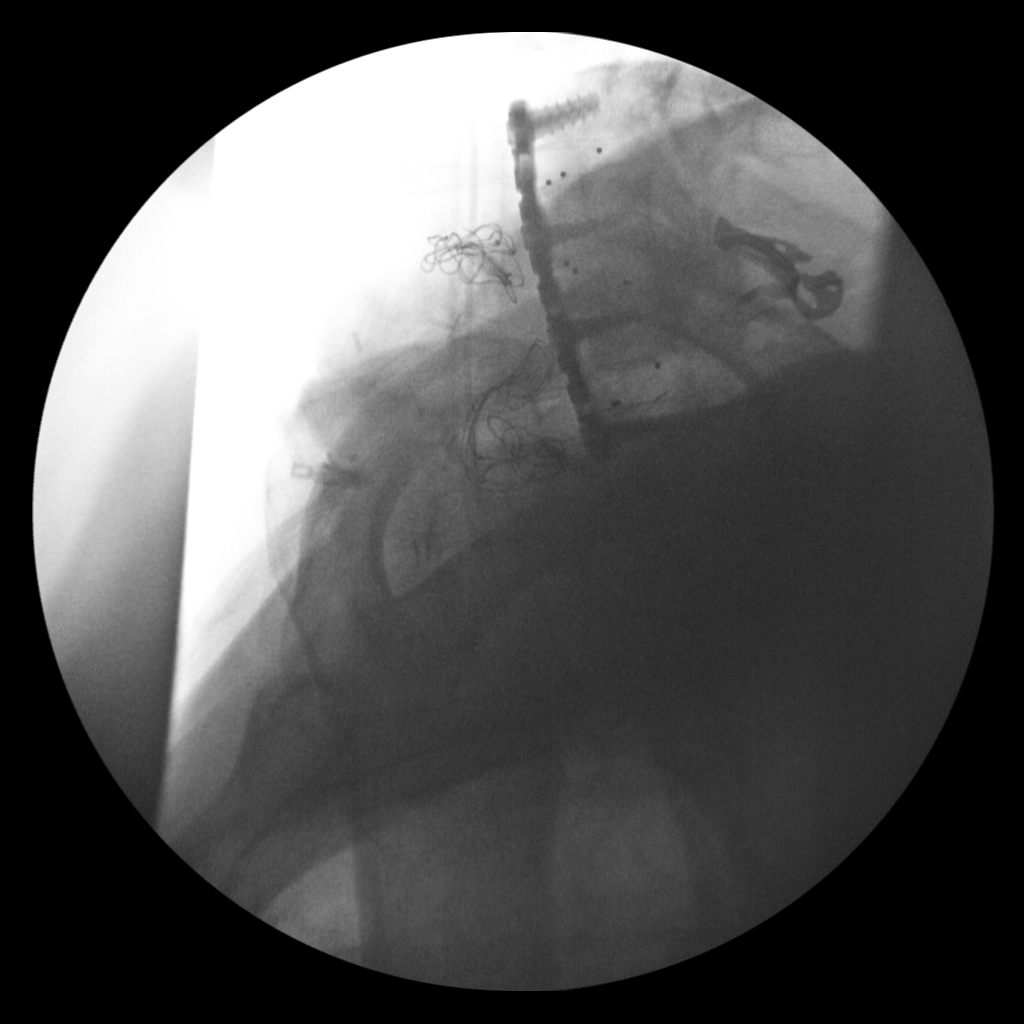

[2 of 2 positions shown; findings below may reference images not displayed]

FINDINGS: Two low resolution intraoperative spot views of the cervical spine.
Total fluoroscopy time was 13 seconds. Radiopaque sponge material
anteriorly. Surgical plate and fixating screws C3 through C6.
IMPRESSION: Intraoperative fluoroscopic assistance provided during cervical
spine surgery.

## 2017-09-03 SURGERY — ANTERIOR CERVICAL DECOMPRESSION/DISCECTOMY FUSION 3 LEVELS
Anesthesia: General

## 2017-09-03 MED ORDER — PNEUMOCOCCAL VAC POLYVALENT 25 MCG/0.5ML IJ INJ
0.5000 mL | INJECTION | INTRAMUSCULAR | Status: AC
Start: 2017-09-04 — End: 2017-09-04
  Administered 2017-09-04: 0.5 mL via INTRAMUSCULAR
  Filled 2017-09-03: qty 0.5

## 2017-09-03 MED ORDER — HYDROMORPHONE HCL 1 MG/ML IJ SOLN: 0.5000 mg | INTRAMUSCULAR | Status: DC | PRN

## 2017-09-03 MED ORDER — THROMBIN 5000 UNITS EX SOLR
CUTANEOUS | Status: AC
  Filled 2017-09-03: qty 5000

## 2017-09-03 MED ORDER — HYDROMORPHONE HCL 1 MG/ML IJ SOLN
INTRAMUSCULAR | Status: AC
  Filled 2017-09-03: qty 1

## 2017-09-03 MED ORDER — VANCOMYCIN HCL IN DEXTROSE 1-5 GM/200ML-% IV SOLN
INTRAVENOUS | Status: AC
  Administered 2017-09-03: 1000 mg via INTRAVENOUS
  Filled 2017-09-03: qty 200

## 2017-09-03 MED ORDER — PROMETHAZINE HCL 25 MG/ML IJ SOLN: 6.2500 mg | INTRAMUSCULAR | Status: DC | PRN

## 2017-09-03 MED ORDER — CYCLOBENZAPRINE HCL 10 MG PO TABS
10.0000 mg | ORAL_TABLET | Freq: Three times a day (TID) | ORAL | Status: DC | PRN
  Administered 2017-09-04 – 2017-09-05 (×3): 10 mg via ORAL
  Filled 2017-09-03 (×4): qty 1

## 2017-09-03 MED ORDER — DEXAMETHASONE SODIUM PHOSPHATE 10 MG/ML IJ SOLN
INTRAMUSCULAR | Status: AC
  Filled 2017-09-03: qty 1

## 2017-09-03 MED ORDER — MIDAZOLAM HCL 2 MG/2ML IJ SOLN: 0.5000 mg | Freq: Once | INTRAMUSCULAR | Status: DC | PRN

## 2017-09-03 MED ORDER — FENTANYL CITRATE (PF) 100 MCG/2ML IJ SOLN
INTRAMUSCULAR | Status: DC | PRN
  Administered 2017-09-03 (×3): 50 ug via INTRAVENOUS
  Administered 2017-09-03: 100 ug via INTRAVENOUS

## 2017-09-03 MED ORDER — THROMBIN 20000 UNITS EX SOLR
CUTANEOUS | Status: AC
  Filled 2017-09-03: qty 20000

## 2017-09-03 MED ORDER — LABETALOL HCL 5 MG/ML IV SOLN
INTRAVENOUS | Status: AC
  Filled 2017-09-03: qty 4

## 2017-09-03 MED ORDER — LACTATED RINGERS IV SOLN
INTRAVENOUS | Status: DC | PRN
  Administered 2017-09-03 (×2): via INTRAVENOUS

## 2017-09-03 MED ORDER — SODIUM CHLORIDE 0.9% FLUSH: 3.0000 mL | INTRAVENOUS | Status: DC | PRN

## 2017-09-03 MED ORDER — OXYCODONE HCL 5 MG PO TABS
10.0000 mg | ORAL_TABLET | ORAL | Status: DC | PRN
  Administered 2017-09-03 – 2017-09-06 (×5): 10 mg via ORAL
  Filled 2017-09-03 (×6): qty 2

## 2017-09-03 MED ORDER — MENTHOL 3 MG MT LOZG: 1.0000 | LOZENGE | OROMUCOSAL | Status: DC | PRN

## 2017-09-03 MED ORDER — CEFAZOLIN SODIUM-DEXTROSE 2-4 GM/100ML-% IV SOLN
2.0000 g | Freq: Three times a day (TID) | INTRAVENOUS | Status: AC
  Administered 2017-09-03 – 2017-09-04 (×2): 2 g via INTRAVENOUS
  Filled 2017-09-03 (×2): qty 100

## 2017-09-03 MED ORDER — SODIUM CHLORIDE 0.9 % IV SOLN: 250.0000 mL | INTRAVENOUS | Status: DC

## 2017-09-03 MED ORDER — 0.9 % SODIUM CHLORIDE (POUR BTL) OPTIME
TOPICAL | Status: DC | PRN
  Administered 2017-09-03: 1000 mL

## 2017-09-03 MED ORDER — ALUM & MAG HYDROXIDE-SIMETH 200-200-20 MG/5ML PO SUSP: 30.0000 mL | Freq: Four times a day (QID) | ORAL | Status: DC | PRN

## 2017-09-03 MED ORDER — SODIUM CHLORIDE 0.9 % IR SOLN
Status: DC | PRN
  Administered 2017-09-03: 14:00:00

## 2017-09-03 MED ORDER — PANTOPRAZOLE SODIUM 40 MG IV SOLR
40.0000 mg | Freq: Every day | INTRAVENOUS | Status: DC
  Administered 2017-09-03 – 2017-09-04 (×2): 40 mg via INTRAVENOUS
  Filled 2017-09-03 (×2): qty 40

## 2017-09-03 MED ORDER — DEXAMETHASONE SODIUM PHOSPHATE 10 MG/ML IJ SOLN
10.0000 mg | Freq: Four times a day (QID) | INTRAMUSCULAR | Status: AC
  Administered 2017-09-03 – 2017-09-04 (×4): 10 mg via INTRAVENOUS
  Filled 2017-09-03 (×4): qty 1

## 2017-09-03 MED ORDER — EPHEDRINE SULFATE-NACL 50-0.9 MG/10ML-% IV SOSY
PREFILLED_SYRINGE | INTRAVENOUS | Status: DC | PRN
  Administered 2017-09-03: 5 mg via INTRAVENOUS
  Administered 2017-09-03 (×2): 10 mg via INTRAVENOUS

## 2017-09-03 MED ORDER — GELATIN ABSORBABLE MT POWD
OROMUCOSAL | Status: DC | PRN
  Administered 2017-09-03: 14:00:00 via TOPICAL
  Administered 2017-09-03: 5 mL via TOPICAL

## 2017-09-03 MED ORDER — DEXAMETHASONE SODIUM PHOSPHATE 10 MG/ML IJ SOLN
10.0000 mg | INTRAMUSCULAR | Status: AC
  Administered 2017-09-03: 10 mg via INTRAVENOUS

## 2017-09-03 MED ORDER — HYDROMORPHONE HCL 1 MG/ML IJ SOLN
INTRAMUSCULAR | Status: AC
  Administered 2017-09-03: 0.5 mg via INTRAVENOUS
  Filled 2017-09-03: qty 1

## 2017-09-03 MED ORDER — ACETAMINOPHEN 650 MG RE SUPP: 650.0000 mg | RECTAL | Status: DC | PRN

## 2017-09-03 MED ORDER — HYDROMORPHONE HCL 1 MG/ML IJ SOLN
0.2500 mg | INTRAMUSCULAR | Status: DC | PRN
  Administered 2017-09-03 (×3): 0.5 mg via INTRAVENOUS

## 2017-09-03 MED ORDER — CHLORHEXIDINE GLUCONATE CLOTH 2 % EX PADS: 6.0000 | MEDICATED_PAD | Freq: Once | CUTANEOUS | Status: DC

## 2017-09-03 MED ORDER — MEPERIDINE HCL 50 MG/ML IJ SOLN: 6.2500 mg | INTRAMUSCULAR | Status: DC | PRN

## 2017-09-03 MED ORDER — PHENOL 1.4 % MT LIQD
1.0000 | OROMUCOSAL | Status: DC | PRN
  Administered 2017-09-03: 1 via OROMUCOSAL
  Filled 2017-09-03: qty 177

## 2017-09-03 MED ORDER — ONDANSETRON HCL 4 MG/2ML IJ SOLN
INTRAMUSCULAR | Status: DC | PRN
  Administered 2017-09-03: 4 mg via INTRAVENOUS

## 2017-09-03 MED ORDER — PROPOFOL 10 MG/ML IV BOLUS
INTRAVENOUS | Status: DC | PRN
  Administered 2017-09-03: 120 mg via INTRAVENOUS

## 2017-09-03 MED ORDER — SUGAMMADEX SODIUM 200 MG/2ML IV SOLN
INTRAVENOUS | Status: DC | PRN
  Administered 2017-09-03: 200 mg via INTRAVENOUS

## 2017-09-03 MED ORDER — FENTANYL CITRATE (PF) 250 MCG/5ML IJ SOLN
INTRAMUSCULAR | Status: AC
  Filled 2017-09-03: qty 5

## 2017-09-03 MED ORDER — ROCURONIUM BROMIDE 100 MG/10ML IV SOLN
INTRAVENOUS | Status: DC | PRN
  Administered 2017-09-03: 40 mg via INTRAVENOUS
  Administered 2017-09-03 (×2): 10 mg via INTRAVENOUS
  Administered 2017-09-03: 50 mg via INTRAVENOUS

## 2017-09-03 MED ORDER — VANCOMYCIN HCL IN DEXTROSE 1-5 GM/200ML-% IV SOLN
1000.0000 mg | INTRAVENOUS | Status: AC
  Administered 2017-09-03: 1000 mg via INTRAVENOUS

## 2017-09-03 MED ORDER — SODIUM CHLORIDE 0.9% FLUSH
3.0000 mL | Freq: Two times a day (BID) | INTRAVENOUS | Status: DC
  Administered 2017-09-03 – 2017-09-06 (×6): 3 mL via INTRAVENOUS

## 2017-09-03 MED ORDER — MIDAZOLAM HCL 2 MG/2ML IJ SOLN
INTRAMUSCULAR | Status: AC
  Filled 2017-09-03: qty 2

## 2017-09-03 MED ORDER — ONDANSETRON HCL 4 MG/2ML IJ SOLN: 4.0000 mg | Freq: Four times a day (QID) | INTRAMUSCULAR | Status: DC | PRN

## 2017-09-03 MED ORDER — PROPOFOL 10 MG/ML IV BOLUS
INTRAVENOUS | Status: AC
  Filled 2017-09-03: qty 20

## 2017-09-03 MED ORDER — ONDANSETRON HCL 4 MG PO TABS: 4.0000 mg | ORAL_TABLET | Freq: Four times a day (QID) | ORAL | Status: DC | PRN

## 2017-09-03 MED ORDER — ACETAMINOPHEN 325 MG PO TABS: 650.0000 mg | ORAL_TABLET | ORAL | Status: DC | PRN

## 2017-09-03 MED ORDER — LIDOCAINE 2% (20 MG/ML) 5 ML SYRINGE
INTRAMUSCULAR | Status: DC | PRN
  Administered 2017-09-03: 30 mg via INTRAVENOUS

## 2017-09-03 MED ORDER — THROMBIN (RECOMBINANT) 20000 UNITS EX SOLR
CUTANEOUS | Status: DC | PRN
  Administered 2017-09-03: 14:00:00 via TOPICAL

## 2017-09-03 MED ORDER — CEFAZOLIN SODIUM-DEXTROSE 2-4 GM/100ML-% IV SOLN
2.0000 g | INTRAVENOUS | Status: AC
  Administered 2017-09-03: 2 g via INTRAVENOUS

## 2017-09-03 MED ORDER — CEFAZOLIN SODIUM-DEXTROSE 2-4 GM/100ML-% IV SOLN
INTRAVENOUS | Status: AC
  Filled 2017-09-03: qty 100

## 2017-09-03 SURGICAL SUPPLY — 64 items
BAG DECANTER FOR FLEXI CONT (MISCELLANEOUS) ×2 IMPLANT
BENZOIN TINCTURE PRP APPL 2/3 (GAUZE/BANDAGES/DRESSINGS) ×4 IMPLANT
BIT DRILL 13 (BIT) ×2 IMPLANT
BLADE 11 SAFETY STRL DISP (BLADE) ×2 IMPLANT
BONE VIVIGEN FORMABLE 1.3CC (Bone Implant) ×4 IMPLANT
BUR MATCHSTICK NEURO 3.0 LAGG (BURR) ×2 IMPLANT
CANISTER SUCT 3000ML PPV (MISCELLANEOUS) ×2 IMPLANT
CARTRIDGE OIL MAESTRO DRILL (MISCELLANEOUS) ×1 IMPLANT
CLOSURE STERI-STRIP 1/4X4 (GAUZE/BANDAGES/DRESSINGS) ×2 IMPLANT
DECANTER SPIKE VIAL GLASS SM (MISCELLANEOUS) IMPLANT
DERMABOND ADVANCED (GAUZE/BANDAGES/DRESSINGS) ×2
DERMABOND ADVANCED .7 DNX12 (GAUZE/BANDAGES/DRESSINGS) ×2 IMPLANT
DIFFUSER DRILL AIR PNEUMATIC (MISCELLANEOUS) ×2 IMPLANT
DRAIN SNY WOU 7FLT (WOUND CARE) ×2 IMPLANT
DRAPE C-ARM 42X72 X-RAY (DRAPES) ×4 IMPLANT
DRAPE LAPAROTOMY 100X72 PEDS (DRAPES) ×2 IMPLANT
DRAPE MICROSCOPE LEICA (MISCELLANEOUS) ×2 IMPLANT
DRSG OPSITE POSTOP 4X6 (GAUZE/BANDAGES/DRESSINGS) ×2 IMPLANT
DURAPREP 6ML APPLICATOR 50/CS (WOUND CARE) ×2 IMPLANT
ELECT COATED BLADE 2.86 ST (ELECTRODE) ×2 IMPLANT
ELECT REM PT RETURN 9FT ADLT (ELECTROSURGICAL) ×2
ELECTRODE REM PT RTRN 9FT ADLT (ELECTROSURGICAL) ×1 IMPLANT
EVACUATOR SILICONE 100CC (DRAIN) ×2 IMPLANT
GAUZE SPONGE 4X4 12PLY STRL (GAUZE/BANDAGES/DRESSINGS) ×2 IMPLANT
GAUZE SPONGE 4X4 16PLY XRAY LF (GAUZE/BANDAGES/DRESSINGS) ×2 IMPLANT
GLOVE BIO SURGEON STRL SZ7 (GLOVE) IMPLANT
GLOVE BIO SURGEON STRL SZ8 (GLOVE) ×2 IMPLANT
GLOVE BIOGEL PI IND STRL 7.0 (GLOVE) IMPLANT
GLOVE BIOGEL PI INDICATOR 7.0 (GLOVE)
GLOVE EXAM NITRILE LRG STRL (GLOVE) IMPLANT
GLOVE EXAM NITRILE XL STR (GLOVE) IMPLANT
GLOVE EXAM NITRILE XS STR PU (GLOVE) IMPLANT
GLOVE INDICATOR 8.5 STRL (GLOVE) ×2 IMPLANT
GOWN STRL REUS W/ TWL LRG LVL3 (GOWN DISPOSABLE) IMPLANT
GOWN STRL REUS W/ TWL XL LVL3 (GOWN DISPOSABLE) ×1 IMPLANT
GOWN STRL REUS W/TWL 2XL LVL3 (GOWN DISPOSABLE) IMPLANT
GOWN STRL REUS W/TWL LRG LVL3 (GOWN DISPOSABLE)
GOWN STRL REUS W/TWL XL LVL3 (GOWN DISPOSABLE) ×1
HALTER HD/CHIN CERV TRACTION D (MISCELLANEOUS) ×2 IMPLANT
HEMOSTAT POWDER KIT SURGIFOAM (HEMOSTASIS) ×4 IMPLANT
KIT BASIN OR (CUSTOM PROCEDURE TRAY) ×2 IMPLANT
KIT ROOM TURNOVER OR (KITS) ×2 IMPLANT
NEEDLE SPNL 20GX3.5 QUINCKE YW (NEEDLE) ×2 IMPLANT
NS IRRIG 1000ML POUR BTL (IV SOLUTION) ×2 IMPLANT
OIL CARTRIDGE MAESTRO DRILL (MISCELLANEOUS) ×2
PACK LAMINECTOMY NEURO (CUSTOM PROCEDURE TRAY) ×2 IMPLANT
PIN DISTRACTION 14MM (PIN) ×2 IMPLANT
PLATE 3 62.5XLCK NS SPNE CVD (Plate) ×1 IMPLANT
PLATE 3 ATLANTIS TRANS (Plate) ×1 IMPLANT
RUBBERBAND STERILE (MISCELLANEOUS) ×4 IMPLANT
SCREW ST FIX 4 ATL 3120213 (Screw) ×16 IMPLANT
SPACER ACDF TPS LG LORDOTIC 7 (Spacer) ×2 IMPLANT
SPACER COLONIAL ACDF TPS 8 LG (Spacer) ×4 IMPLANT
SPONGE INTESTINAL PEANUT (DISPOSABLE) ×2 IMPLANT
SPONGE SURGIFOAM ABS GEL 100 (HEMOSTASIS) ×2 IMPLANT
STRIP CLOSURE SKIN 1/2X4 (GAUZE/BANDAGES/DRESSINGS) ×2 IMPLANT
SUT VIC AB 3-0 SH 8-18 (SUTURE) ×2 IMPLANT
SUT VIC AB 4-0 PS2 27 (SUTURE) ×2 IMPLANT
SYR CONTROL 10ML LL (SYRINGE) ×2 IMPLANT
TAPE CLOTH 4X10 WHT NS (GAUZE/BANDAGES/DRESSINGS) IMPLANT
TOWEL GREEN STERILE (TOWEL DISPOSABLE) ×2 IMPLANT
TOWEL GREEN STERILE FF (TOWEL DISPOSABLE) ×2 IMPLANT
TRAP SPECIMEN MUCOUS 40CC (MISCELLANEOUS) ×4 IMPLANT
WATER STERILE IRR 1000ML POUR (IV SOLUTION) ×2 IMPLANT

## 2017-09-03 NOTE — H&P (Addendum)
[name] is an 77 y.o. male.   Chief Complaint: Weakness HPI: 77 year old gentleman with a 2-3 months of progressive weakness 2 hospitalizations for generalized weakness of bronchitis. He is noted a progressive loss of his ability to ambulate on his own is noted increased weakness in his hands and clumsiness. 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 History of signal change within his cord and clinical exam consistent with myelopathy. Due to patient's progression of clinical syndrome imaging findings of a conservative treatment I recommended anterior cervical discectomies and fusion at those levels. I extensively reviewed the risks and benefits of the operation with the patient as well as perioperative course expectations of outcome and alternatives of surgery and he understands and agrees to proceed forward.  Past Medical History:  Diagnosis Date  . Arthritis   . Barrett esophagus   . Bronchitis   . CAD (coronary artery disease)    pt's wife denies any cardiac history on pt.  . Cancer ([place])    thyroid  . Carotid artery occlusion   . Chronic kidney disease   . Constipation   . COPD (chronic obstructive pulmonary disease) ([place])   . Diabetes mellitus without complication ([place])   . Dyspnea   . GERD (gastroesophageal reflux disease)   . Headache    migraines  . History of thyroid cancer 2008  . History of thyroid cancer   . Hypertension   . OSA (obstructive sleep apnea)   . Pneumonia   . Restless leg   . S/P tendon repair    right  . Sleep apnea with use of continuous positive airway pressure (CPAP)    2011 [facility] , [facility]  77cn central and obstrcutive. 16 cm water , 3 cm EPR.   . Thoracic ascending aortic aneurysm (HCC)    4.2 cm ascending TAA 08/18/17 CTA. Annual imaging recommended.  . Thyroid disease   . Ulcer    Peptic ulcer disease    Past Surgical History:  Procedure Laterality Date  . CAROTID ENDARTERECTOMY Left January 03, 2006   Dr. Amedeo Plenty  . COLONOSCOPY    . EYE SURGERY Bilateral    cataract surgery with lens implants  . game keepers thumb Right   . JOINT REPLACEMENT Left    knee X 2  . ROTATOR CUFF REPAIR Right   . THYROIDECTOMY  2008    Family History  Problem Relation Age of Onset  . COPD Mother   . Hyperlipidemia Mother   . Hypertension Mother   . Diabetes Father   . Kidney disease Father        ESRD  . Hypertension Father   . Cancer Father   . Hyperlipidemia Father    Social History:  reports that he quit smoking about 12 years ago. His smoking use included cigars and cigarettes. He quit after 40.00 years of use. he has never used smokeless tobacco. He reports that he does not drink alcohol or use drugs.  Allergies: No Known Allergies  Medications Prior to Admission  Medication Sig Dispense Refill  . allopurinol (ZYLOPRIM) 300 MG tablet Take 300 mg by mouth daily.   12  . calcium citrate-vitamin D (CITRACAL+D) 315-200 MG-UNIT per tablet Take 1 tablet by mouth daily. 750 mg     . carbidopa-levodopa (SINEMET IR) 10-100 MG tablet Take 1 tablet by mouth 2 (two) times daily. (Patient taking differently: Take 1 tablet by mouth 3 (three) times daily. Take 1 tablet  in the morning about 9am, 1 tablet between Noon & 1, then take 1 tablet at night at 8pm) 90 tablet 4  . cholecalciferol (VITAMIN D) 1000 units tablet Take 1,000 Units by mouth daily.     . cloNIDine (CATAPRES) 0.1 MG tablet Take 0.1 mg by mouth 2 (two) times daily.  8  . COLCRYS 0.6 MG tablet Take 0.6 mg by mouth daily.    . fish oil-omega-3 fatty acids 1000 MG capsule Take 3 g by mouth daily.     . fluticasone furoate-vilanterol (BREO ELLIPTA) 100-25 MCG/INH AEPB Inhale 1 puff into the lungs daily.    [name] guaifenesin (HUMIBID E) 400 MG TABS tablet Take 400 mg by mouth 2 (two) times daily as needed (congestion).    [name] HYDROcodone-acetaminophen (NORCO/VICODIN) 5-325 MG per tablet Take 1 tablet by mouth 3 (three) times daily as needed (pain).      [name] levothyroxine (SYNTHROID, LEVOTHROID) 200 MCG tablet Take 200 mcg by mouth daily before breakfast.     . losartan-hydrochlorothiazide (HYZAAR) 100-25 MG per tablet Take 1 tablet by mouth daily.    . metFORMIN (GLUCOPHAGE) 500 MG tablet Take 1 tablet (500 mg total) by mouth 2 (two) times daily with a meal.  0  . metoprolol tartrate (LOPRESSOR) 50 MG tablet Take 50 mg by mouth 2 (two) times daily.    . Multiple Vitamin (MULTIVITAMIN) tablet Take 1 tablet by mouth daily.    . pantoprazole (PROTONIX) 40 MG tablet Take 40 mg by mouth every evening.    . predniSONE (DELTASONE) 10 MG tablet Take 4 tablets for 3 days; Take 3 tablets for 4 days; Take 2 tablets for 3 days; Take 1 tablet for 4 days 34 tablet 0  . rOPINIRole (REQUIP) 1 MG tablet TAKE 1 TABLET BY MOUTH IN THE MORNING AND 4 TABLETS AT BEDTIME (Patient taking differently: TAKE 1mg  BY MOUTH IN THE MORNING AND 4mg  AT BEDTIME) 150 tablet 2  . simvastatin (ZOCOR) 40 MG tablet   0  . TRAVATAN Z 0.004 % SOLN ophthalmic solution Place 1 drop into both eyes.     [name] umeclidinium bromide (INCRUSE ELLIPTA) 62.5 MCG/INH AEPB Inhale 1 puff into the lungs daily.    . VENTOLIN HFA 108 (90 Base) MCG/ACT inhaler Inhale 1-2 puffs into the lungs every 4 (four) hours as needed for wheezing or shortness of breath.   6  . nitroGLYCERIN (NITROSTAT) 0.4 MG SL tablet Place 0.4 mg every 5 (five) minutes as needed under the tongue for chest pain.    [name] oxymetazoline (AFRIN) 0.05 % nasal spray Place 2 sprays into the nose 2 (two) times daily as needed for congestion.     . rizatriptan (MAXALT) 10 MG tablet Take 10 mg by mouth as needed for migraine. May repeat in 2 hours if needed      Results for orders placed or performed during the hospital encounter of 09/03/17 (from the past 48 hour(s))  Glucose, capillary     Status: None   Collection Time: 09/03/17 11:12 AM  Result Value Ref Range   Glucose-Capillary 81 65 - 99 mg/dL  Hemoglobin A1c     Status: None    Collection Time: 09/03/17 12:09 PM  Result Value Ref Range   Hgb A1c MFr Bld 5.6 4.8 - 5.6 %    Comment: (NOTE) Pre diabetes:          5.7%-6.4% Diabetes:              >6.4% Glycemic control  for   <7.0% adults with diabetes    Mean Plasma Glucose 114.02 mg/dL    Comment: Performed at [facility] [address], [place], [place] [address]   No results found.  Review of Systems  Musculoskeletal: Positive for falls, myalgias and neck pain.    Blood pressure 106/63, pulse 78, temperature 98.1 F (36.7 C), temperature source Oral, resp. rate 20, height 5\' 7"  (1.702 m), weight 86.2 kg (190 lb), SpO2 95 %. Physical Exam  Constitutional: He is oriented to person, place, and time. He appears well-developed and well-nourished.  HENT:  Head: Normocephalic.  Eyes: Pupils are equal, round, and reactive to light.  Neck: Normal range of motion.  Respiratory: Effort normal.  GI: Soft. Bowel sounds are normal.  Neurological: He is alert and oriented to person, place, and time. He has normal strength. GCS eye subscore is 4. GCS verbal subscore is 5. GCS motor subscore is 6.  Awake alert strength is 5 out of 5 deltoid, bicep and wrist extension flexion triceps weakness bilaterally at 4 minus out of 5 and intrinsics are weak bilaterally 4 out of 5 in lower extremities are 5 out of 5  Addendum: Right lower extremity was weaker in the left lower extremity at 4 minus out of 5  Skin: Skin is warm and dry.     Assessment/Plan 76 showed presents for ACDF C3-4, C4-5, C5-6.  Gamble Enderle P, MD 09/03/2017, 1:01 PM

## 2017-09-03 NOTE — Anesthesia Preprocedure Evaluation (Addendum)
Anesthesia Evaluation  Patient identified by MRN, date of birth, ID band Patient awake    Reviewed: Allergy & Precautions, NPO status , Patient's Chart, lab work & pertinent test results, reviewed documented beta blocker date and time   History of Anesthesia Complications Negative for: history of anesthetic complications  Airway Mallampati: II  TM Distance: >3 FB Neck ROM: Full    Dental  (+) Poor Dentition, Missing, Dental Advisory Given, Chipped   Pulmonary sleep apnea and Continuous Positive Airway Pressure Ventilation , COPD,  COPD inhaler, former smoker (quit 2007),    breath sounds clear to auscultation       Cardiovascular hypertension, Pt. on medications and Pt. on home beta blockers (-) angina+ Peripheral Vascular Disease (: Thoracic ascending aortic aneurysm  4.2 cm  08/18/17 CTA. [name])   Rhythm:Regular Rate:Normal  08/20/17 ECHO: EF 65-70%, mild AI   Neuro/Psych    GI/Hepatic Neg liver ROS, GERD  Medicated and Controlled,  Endo/Other  diabetes (glu 81)Hypothyroidism   Renal/GU      Musculoskeletal  (+) Arthritis ,   Abdominal (+) + obese,   Peds  Hematology negative hematology ROS (+)   Anesthesia Other Findings   Reproductive/Obstetrics                            Anesthesia Physical Anesthesia Plan  ASA: III  Anesthesia Plan: General   Post-op Pain Management:    Induction: Intravenous  PONV Risk Score and Plan: 3 and Ondansetron, Dexamethasone and Diphenhydramine  Airway Management Planned: Oral ETT  Additional Equipment:   Intra-op Plan:   Post-operative Plan: Extubation in OR  Informed Consent: I have reviewed the patients History and Physical, chart, labs and discussed the procedure including the risks, benefits and alternatives for the proposed anesthesia with the patient or authorized representative who has indicated his/her understanding and acceptance.   Dental  advisory given  Plan Discussed with: CRNA and Surgeon  Anesthesia Plan Comments: (Plan routine monitors, GETA)        Anesthesia Quick Evaluation

## 2017-09-03 NOTE — Anesthesia Procedure Notes (Addendum)
Procedure Name: Intubation Date/Time: 09/03/2017 1:43 PM Performed by: Leonor Liv, CRNA Pre-anesthesia Checklist: Patient identified, Emergency Drugs available, Suction available and Patient being monitored Patient Re-evaluated:Patient Re-evaluated prior to induction Oxygen Delivery Method: Circle System Utilized Preoxygenation: Pre-oxygenation with 100% oxygen Induction Type: IV induction Ventilation: Mask ventilation without difficulty, Oral airway inserted - appropriate to patient size and Two handed mask ventilation required Laryngoscope Size: Glidescope and 4 Grade View: Grade I Tube type: Oral Tube size: 7.5 mm Number of attempts: 1 Airway Equipment and Method: Oral airway,  Rigid stylet and Video-laryngoscopy Placement Confirmation: ETT inserted through vocal cords under direct vision,  positive ETCO2 and breath sounds checked- equal and bilateral Secured at: 23 cm Tube secured with: Tape Dental Injury: Teeth and Oropharynx as per pre-operative assessment  Difficulty Due To: Difficult Airway-  due to neck instability

## 2017-09-03 NOTE — Transfer of Care (Signed)
Immediate Anesthesia Transfer of Care Note  Patient: [name]  Procedure(s) Performed: ACDF - C3-C4 - C4-C5 - C5-C6 (N/A )  Patient Location: PACU  Anesthesia Type:General  Level of Consciousness: awake and alert   Airway & Oxygen Therapy: Patient Spontanous Breathing and Patient connected to face mask oxygen  Post-op Assessment: Report given to RN and Post -op Vital signs reviewed and stable  Post vital signs: Reviewed and stable  Last Vitals:  Vitals:   09/03/17 1123  BP: 106/63  Pulse: 78  Resp: 20  Temp: 36.7 C  SpO2: 95%    Last Pain:  Vitals:   09/03/17 1123  TempSrc: Oral      Patients Stated Pain Goal: 2 (71/16/57 9038)  Complications: No apparent anesthesia complications

## 2017-09-03 NOTE — Progress Notes (Signed)
Patient ate cereal and drank milk at 0730 this morning.  Per Dr. Jenita Seashore will be a 6 hour delay for surgery.  Instructed patient to arrive for surgery at 11am this morning for a planned 1330 surgery time

## 2017-09-03 NOTE — Anesthesia Postprocedure Evaluation (Signed)
Anesthesia Post Note  Patient: [name]  Procedure(s) Performed: ACDF - C3-C4 - C4-C5 - C5-C6 (N/A )     Patient location during evaluation: PACU Anesthesia Type: General Level of consciousness: awake and alert, patient cooperative and oriented Pain management: pain level controlled Vital Signs Assessment: post-procedure vital signs reviewed and stable Respiratory status: spontaneous breathing, nonlabored ventilation, respiratory function stable and patient connected to nasal cannula oxygen Cardiovascular status: blood pressure returned to baseline and stable Postop Assessment: no apparent nausea or vomiting Anesthetic complications: no    Last Vitals:  Vitals:   09/03/17 1920 09/03/17 1935  BP: (!) 151/78 (!) 154/78  Pulse: 87 88  Resp: 14 14  Temp:  36.6 C  SpO2: 95% 93%    Last Pain:  Vitals:   09/03/17 1935  TempSrc:   PainSc: 2                  Filippo Puls,E. Irais Mottram

## 2017-09-03 NOTE — Op Note (Signed)
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  Surgeon: Dominica Severin Oliviagrace Crisanti  Asst.: [place] Bailiff  Anesthesia: Gen.  EBL: Minimal  History of present illness: 77 year old gentleman with 3 months of progress worsening difficult to with numbness tingling weakness in his hands and difficulty ambulating. Workup revealed severe cord compression with signal change within his cord and clinical exam was consistent with a myelopathy. Due to progressive conical syndrome imaging findings a conservative treatment I recommended anterior cervical discectomies and fusion at those levels. I extensively reviewed the risks and benefits of the operation with the patient as well as perioperative course expectations of outcome and alternatives of surgery and he understood and agreed to proceed forward.  Operative procedure: Patient brought into the or was induced on general anesthesia positioned supine the neck in slight extension in 5 pounds of halter traction the right side his neck was prepped and draped in routine sterile fashion. Preoperative x-ray localize the appropriate level so a curvilinear incision was made just off midline to the anterior border of the sternocleidomastoid. The superficial layer of the platysma was dissected out and divided longitudinally the avascular plane to sternomastoid and strap muscles was developed down to the previously fashioned professes acid with [place]. Interoperative x-ray confirmed location appropriate level. Annulotomy's were made at the C3-4 and  C4-5 disc space level retractor was positioned primarily at these 2 disc spaces initially. First working at C3-4 disc space is drilled down capturing the bone shavings in a mucous trap. Under microscopic illumination very large spur and soft disc material was removed from the posterior aspect of the C3 and C4 vertebral bodies and the large disc herniation was teased off of the thecal sac decompress the spinal cord. Aggressively under biting both endplates decompress central canal marking laterally both C4 pedicles were identified and this confirmed adequate discectomy and decompression. At this point a 7 mm lordotic peek cage packed with locally harvested autograft mixed with of imaging was packed in place. Then the retractors were repositioned at C4-5 C4-5 had a slight listhesis large soft disc herniation this is all teased away in the disc space was further drilled down capturing the bone shavings in a mucous trap. There was a large posterior osteophyte coming off the C4 vertebral body this was aggressively under been decompress the central canal. An 8 mm parallel cage was packed and inserted this level. Then the retractor was repositioned at C5-6 and C5-6 was drilled down a similar fashion all very large posterior spur coming off the C5 vertebral body as well as some soft disc herniation was teased off of the dura decompress the central canal. Both C6 pedicles were palpated mandible C6 nerve roots decompressed. Another 8 mm parallel cage was inserted this level as well. It is been packed with locally harvested autograft mix. After all the interbody work been done. Cages and placed attention taken the placement of the translational plate I selected a 62 mm translational plate and all screws excellent purchase except the one screw on the right at C4 stripped I attempted place rescue this stripped as well and so I left that empty  I locked the screws in place, was irrigated wound placed 7 mm Jackson-Pratt drain and  closed wound in layers with interrupted Vicryl and a running 4 subcuticular Dermabond benzo and Steri-Strips and sterile dressing was applied patient recovered in stable condition. At the end of case all needle counts sponge counts were correct.

## 2017-09-04 LAB — MRSA PCR SCREENING: MRSA by PCR: NEGATIVE

## 2017-09-04 MED FILL — Thrombin For Soln 5000 Unit: CUTANEOUS | Qty: 5000 | Status: AC

## 2017-09-04 MED FILL — Gelatin Absorbable MT Powder: OROMUCOSAL | Qty: 1 | Status: AC

## 2017-09-04 MED FILL — Thrombin For Soln 20000 Unit: CUTANEOUS | Qty: 1 | Status: AC

## 2017-09-04 NOTE — Social Work (Signed)
CSW aware that pt was at [facility] prior to admitting to [facility]. CSW will f/u with pt to complete assessment. Pt will need BC[employer] Medicare authorization prior to returning to SNF.  Alexander Mt, [place] Work [phone]

## 2017-09-04 NOTE — Evaluation (Addendum)
Occupational Therapy Evaluation Patient Details Name: [name] MRN: [id number] DOB: 1941-01-01 Today's Date: 09/04/2017    History of Present Illness This 78 y.o. 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   PMH includes:   CAD, thyroid CA, CKD, COPD, DM, migraines, OSA   Clinical Impression   Pt admitted with above. He demonstrates the below listed deficits and will benefit from continued OT to maximize safety and independence with BADLs.  Pt presents to OT with decreased ROM and strength bil. UEs, LEs weakness, decreased trunk control, generalized weakness, pain, and decreased activity tolerance.  He requires min - total A for ADLs and max A +2 for functional transfer.  He has had a progressive decline in function since Christmas requiring extensive assist for all ADLs.  He has been in SNF for rehab recently, but prior to that lived with wife.  Recommend return to SNF to continue rehab to allow him to maximize safety and independence with ADLs with plan of eventual return to home with wife's assist. Will follow acutely.       Follow Up Recommendations  SNF;Supervision/Assistance - 24 hour    Equipment Recommendations  None recommended by OT    Recommendations for Other Services       Precautions / Restrictions Precautions Precautions: Fall Precaution Comments: Rt sided > Lt side weakness  Required Braces or Orthoses: Cervical Brace Cervical Brace: Soft collar;For comfort Restrictions Weight Bearing Restrictions: No      Mobility Bed Mobility Overal bed mobility: Needs Assistance Bed Mobility: Sit to Sidelying     Supine to sit: Mod assist;+2 for physical assistance   Sit to sidelying: Total assist;+2 for physical assistance General bed mobility comments: assist to lower trunk and to lift LEs onto bed   Transfers Overall transfer level: Needs assistance Equipment used: Ambulation  equipment used Transfers: Sit to/from Stand;Stand Pivot Transfers Sit to Stand: Max assist;+2 physical assistance Stand pivot transfers: Max assist;+2 physical assistance Squat pivot transfers: Max assist;+2 physical assistance     General transfer comment: Using stedy, pt required max A +2 to move into standing position.  He required mod A to maintain trunk control while seated on stedy and max A to stand second time prior to sitting on bed     Balance Overall balance assessment: Needs assistance Sitting-balance support: Bilateral upper extremity supported;Feet supported Sitting balance-Leahy Scale: Poor Sitting balance - Comments: Pt requires min - mod A to maintain static sitting EOB  Postural control: Posterior lean Standing balance support: Bilateral upper extremity supported Standing balance-Leahy Scale: Poor Standing balance comment: requires max A to maintain standing                            ADL either performed or assessed with clinical judgement   ADL Overall ADL's : Needs assistance/impaired Eating/Feeding: Set up;Sitting Eating/Feeding Details (indicate cue type and reason): Pt reports he was able to self feed using utensils today  Grooming: Wash/dry hands;Wash/dry face;Oral care;Brushing hair;Minimal assistance;Sitting   Upper Body Bathing: Moderate assistance;Sitting   Lower Body Bathing: Total assistance;Sit to/from stand;Bed level   Upper Body Dressing : Maximal assistance;Sitting   Lower Body Dressing: Total assistance;Sit to/from stand;Bed level   Toilet Transfer: Maximal assistance;+2 for physical assistance;Stand-pivot;BSC   Toileting- Clothing Manipulation and Hygiene: Total assistance;Sit to/from stand       Functional mobility during ADLs: Maximal assistance;+2  for physical assistance(stedy )       Vision         Perception     Praxis      Pertinent Vitals/Pain Pain Assessment: Faces Faces Pain Scale: Hurts little more Pain  Location: neck Pain Descriptors / Indicators: Operative site guarding Pain Intervention(s): Monitored during session;Limited activity within patient's tolerance     Hand Dominance Right   Extremity/Trunk Assessment Upper Extremity Assessment Upper Extremity Assessment: RUE deficits/detail;LUE deficits/detail RUE Deficits / Details: shoulders grossly 2/5 ( AROM 80-90*);  elbow distally grossly 4/5  RUE Coordination: decreased fine motor;decreased gross motor LUE Deficits / Details: shoulders grossly 2/5 ( AROM 80-90*);  elbow distally grossly 4/5  LUE Coordination: decreased fine motor;decreased gross motor   Lower Extremity Assessment Lower Extremity Assessment: Defer to PT evaluation Cervical / Trunk Assessment Cervical / Trunk Assessment: Kyphotic;Other exceptions(truncal weakness noted )   Communication Communication Communication: No difficulties   Cognition Arousal/Alertness: Awake/alert Behavior During Therapy: WFL for tasks assessed/performed Overall Cognitive Status: Within Functional Limits for tasks assessed                                 General Comments:   General Comments  wife present and very supportive     Exercises  Other Exercises: Pt instructed to perform bil. UE shoulder flex, abduction, and elbow flex/ext 5 reps 2-3x/day    Shoulder Instructions      Home Living Family/patient expects to be discharged to:: Skilled nursing facility                                 Additional Comments: was initially at [facility], now has been at [facility] for rehab. Plans to return to [facility]       Prior Functioning/Environment Level of Independence: Needs assistance  Gait / Transfers Assistance Needed: occasional SPT with staff to w/c but mostly hoyer. Was able to walk without AD prior to 1/19 ADL's / Homemaking Assistance Needed: Pt requires assist with all ADLs             OT Problem List: Decreased strength;Decreased range  of motion;Decreased activity tolerance;Impaired balance (sitting and/or standing);Decreased coordination;Decreased safety awareness;Decreased knowledge of use of DME or AE;Decreased knowledge of precautions;Impaired UE functional use;Pain      OT Treatment/Interventions: Self-care/ADL training;Therapeutic exercise;Neuromuscular education;DME and/or AE instruction;Therapeutic activities;Patient/family education;Balance training    OT Goals(Current goals can be found in the care plan section) Acute Rehab OT Goals Patient Stated Goal: to regain independence  OT Goal Formulation: With patient/family Time For Goal Achievement: 09/18/17 Potential to Achieve Goals: Good ADL Goals Pt Will Perform Grooming: with set-up;sitting Pt Will Perform Upper Body Bathing: with min assist;sitting Pt Will Perform Lower Body Bathing: with mod assist;sit to/from stand Pt Will Transfer to Toilet: with mod assist;stand pivot transfer;bedside commode Pt/caregiver will Perform Home Exercise Program: Increased strength;Both right and left upper extremity;With Supervision;With written HEP provided  OT Frequency: Min 2X/week   Barriers to D/C: Decreased caregiver support  wife unable to provide current level of assist        Co-evaluation              AM-PAC PT "6 Clicks" Daily Activity     Outcome Measure Help from another person eating meals?: A Little Help from another person taking care of personal grooming?: A Little Help from another person  toileting, which includes using toliet, bedpan, or urinal?: A Lot Help from another person bathing (including washing, rinsing, drying)?: A Lot Help from another person to put on and taking off regular upper body clothing?: A Lot Help from another person to put on and taking off regular lower body clothing?: Total 6 Click Score: 13   End of Session Equipment Utilized During Treatment: Cervical collar;Gait belt Nurse Communication: Mobility status;Need for lift  equipment  Activity Tolerance: Patient tolerated treatment well Patient left: in bed;with call bell/phone within reach;with family/visitor present;with nursing/sitter in room  OT Visit Diagnosis: Unsteadiness on feet (R26.81);Muscle weakness (generalized) (M62.81)                Time: 6301-6010 OT Time Calculation (min): 20 min Charges:  OT General Charges $OT Visit: 1 Visit OT Evaluation $OT Eval Moderate Complexity: 1 Mod G-Codes:     [facility], OTR/L [phone]   Lucille Passy M 09/04/2017, 2:53 PM

## 2017-09-04 NOTE — Care Management Note (Signed)
Case Management Note  Patient Details  Name: [name] MRN: [id number] Date of Birth: Sep 13, 1940  Subjective/Objective:   This 77 y.o. 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    PTA, pt resided at [facility] and needed assistance with all ADLS.                  Action/Plan: Will consult CSW to facilitate return to SNF upon medical stability.  Will follow progress.    Expected Discharge Date:                  Expected Discharge Plan:  Skilled Nursing Facility  In-House Referral:  Clinical Social Work  Discharge planning Services  CM Consult  Post Acute Care Choice:    Choice offered to:     DME Arranged:    DME Agency:     HH Arranged:    [place] Agency:     Status of Service:  In process, will continue to follow  If discussed at Long Length of Stay Meetings, dates discussed:    Additional Comments:  Reinaldo Raddle, RN, BSN  Trauma/Neuro ICU Case Manager [phone]

## 2017-09-04 NOTE — Progress Notes (Signed)
Subjective: Patient reports doing well improved sensation arms and legs  Objective: Vital signs in last 24 hours: Temp:  [97.7 F (36.5 C)-99 F (37.2 C)] 98.1 F (36.7 C) (03/19 0332) Pulse Rate:  [78-96] 96 (03/19 0332) Resp:  [12-25] 14 (03/18 1935) BP: (106-168)/(63-85) 140/67 (03/19 0332) SpO2:  [92 %-95 %] 92 % (03/19 0332) Weight:  [86.2 kg (190 lb)] 86.2 kg (190 lb) (03/18 1123)  Intake/Output from previous day: 03/18 0701 - 03/19 0700 In: 1550 [I.V.:1500; IV Piggyback:50] Out: 1200 [Urine:1000; Drains:50; Blood:150] Intake/Output this shift: No intake/output data recorded.  improved strength right lower extremity incision clean dry and intact  Lab Results: No results for input(s): WBC, HGB, HCT, PLT in the last 72 hours. BMET No results for input(s): NA, K, CL, CO2, GLUCOSE, BUN, CREATININE, CALCIUM in the last 72 hours.  Studies/Results: Dg Cervical Spine 2-3 Views  Result Date: 09/03/2017 CLINICAL DATA:  C3 through C6 ACDF EXAM: CERVICAL SPINE - 2-3 VIEW; DG C-ARM 61-120 MIN COMPARISON:  MRI 08/23/2017 FINDINGS: Two low resolution intraoperative spot views of the cervical spine. Total fluoroscopy time was 13 seconds. Radiopaque sponge material anteriorly. Surgical plate and fixating screws C3 through C6. IMPRESSION: Intraoperative fluoroscopic assistance provided during cervical spine surgery. Electronically Signed   By: Donavan Foil M.D.   On: 09/03/2017 17:38   Dg C-arm 1-60 Min  Result Date: 09/03/2017 CLINICAL DATA:  C3 through C6 ACDF EXAM: CERVICAL SPINE - 2-3 VIEW; DG C-ARM 61-120 MIN COMPARISON:  MRI 08/23/2017 FINDINGS: Two low resolution intraoperative spot views of the cervical spine. Total fluoroscopy time was 13 seconds. Radiopaque sponge material anteriorly. Surgical plate and fixating screws C3 through C6. IMPRESSION: Intraoperative fluoroscopic assistance provided during cervical spine surgery. Electronically Signed   By: Donavan Foil M.D.   On:  09/03/2017 17:38    Assessment/Plan: Postop day 1 anterior cervical discectomy fusionC3-6. Patient improving with his myelopathy. We'll do physical and occupational therapy assess home health needs versus inpatient rehabilitation. I discontinued his JP drain.  LOS: 1 day     Lakenzie Mcclafferty P 09/04/2017, 7:55 AM

## 2017-09-04 NOTE — Progress Notes (Signed)
Foley catheter removed per MD order, patient tolerated well. Condom cath placed on patient per patient request.

## 2017-09-04 NOTE — Evaluation (Signed)
Physical Therapy Evaluation Patient Details Name: [name] MRN: [id number] DOB: Oct 01, 1940 Today's Date: 09/04/2017   History of Present Illness  77 year old gentleman with a 2-3 months of progressive weakness 2 hospitalizations for generalized weakness of bronchitis. He is noted a progressive loss of his ability to ambulate on his own is noted increased weakness in his hands and clumsiness. 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 History of signal change within his cord and clinical exam consistent with myelopathy. Presents for fusion C2-6. PMH: COPD, DM, thyroid cancer  Clinical Impression  Pt admitted with above diagnosis. Pt currently with functional limitations due to the deficits listed below (see PT Problem List). Pt able to get to EOB with mod A, needs UE support to maintain sitting balance due to decreased trunk control. Performed squat pivot transfer to chair with R knee blocked and use of pad under pt, +2 max A. Recommend maximove for back to bed.  Pt will benefit from skilled PT to increase their independence and safety with mobility to allow discharge to the venue listed below.       Follow Up Recommendations SNF    Equipment Recommendations  None recommended by PT    Recommendations for Other Services       Precautions / Restrictions Precautions Precautions: Fall Precaution Comments: R sided weakness Required Braces or Orthoses: Cervical Brace Cervical Brace: Soft collar;For comfort Restrictions Weight Bearing Restrictions: No      Mobility  Bed Mobility Overal bed mobility: Needs Assistance Bed Mobility: Supine to Sit     Supine to sit: Mod assist;+2 for physical assistance     General bed mobility comments: mod A for LE's R>L, pt able to assist with UE's but has difficulty gaining balance once sitting  Transfers Overall transfer level: Needs assistance Equipment used: None Transfers: Squat Pivot Transfers     Squat pivot transfers: Max assist;+2 physical assistance     General transfer comment: from elevated bed to recliner, R knee blocked. Practiced sit to stand 3x and transferred on 3rd with use of pad. Pt unable to step L foot at all so pivoted on L foot.   Ambulation/Gait             General Gait Details: unable  Stairs            Wheelchair Mobility    Modified Rankin (Stroke Patients Only)       Balance Overall balance assessment: Needs assistance Sitting-balance support: Bilateral upper extremity supported;Feet supported Sitting balance-Leahy Scale: Poor Sitting balance - Comments: pt loses balance to R within 30 secs of lifting R hand off bed   Standing balance support: Bilateral upper extremity supported Standing balance-Leahy Scale: Zero Standing balance comment: max A  +2 to maintain balance in squat position                             Pertinent Vitals/Pain Pain Assessment: Faces Faces Pain Scale: Hurts little more Pain Location: neck Pain Descriptors / Indicators: Aching Pain Intervention(s): Limited activity within patient's tolerance;Monitored during session    Home Living Family/patient expects to be discharged to:: Skilled nursing facility                 Additional Comments: was initially at [facility], now has been at [facility]    Prior Function Level of Independence: Needs assistance   Gait / Transfers Assistance Needed: occasional SPT with staff  to w/c but mostly hoyer. Was able to walk without AD prior to 1/19  ADL's / Homemaking Assistance Needed: wife assists pt to spong bathe        Hand Dominance   Dominant Hand: Right    Extremity/Trunk Assessment   Upper Extremity Assessment Upper Extremity Assessment: Defer to OT evaluation    Lower Extremity Assessment Lower Extremity Assessment: RLE deficits/detail;LLE deficits/detail RLE Deficits / Details: pt has unintentional contractions of RLE and some tremors.  Knee ext 1/5, knee flex 1/5, hip flex 2-/5, ankle df/pf 2/5 RLE Sensation: decreased light touch RLE Coordination: decreased fine motor;decreased gross motor LLE Deficits / Details: hip flex 3/5, knee ext 3+/5, knee flex 3+/5 LLE Sensation: WNL LLE Coordination: WNL    Cervical / Trunk Assessment Cervical / Trunk Assessment: Kyphotic  Communication   Communication: No difficulties  Cognition Arousal/Alertness: Awake/alert Behavior During Therapy: WFL for tasks assessed/performed Overall Cognitive Status: Within Functional Limits for tasks assessed                                 General Comments: motivated, hoping that this surgery helps with unexplained weakness LE's as well as UE issues      General Comments      Exercises General Exercises - Lower Extremity Ankle Circles/Pumps: AROM;Both;20 reps;Seated Quad Sets: AROM;Both;10 reps;Seated Gluteal Sets: AROM;Both;10 reps;Seated   Assessment/Plan    PT Assessment Patient needs continued PT services  PT Problem List Decreased strength;Decreased activity tolerance;Decreased mobility;Pain;Decreased balance       PT Treatment Interventions Functional mobility training;Therapeutic activities;Therapeutic exercise;Balance training;Patient/family education    PT Goals (Current goals can be found in the Care Plan section)  Acute Rehab PT Goals Patient Stated Goal: be able to walk again PT Goal Formulation: With patient Time For Goal Achievement: 09/18/17 Potential to Achieve Goals: Fair    Frequency Min 3X/week   Barriers to discharge        Co-evaluation               AM-PAC PT "6 Clicks" Daily Activity  Outcome Measure Difficulty turning over in bed (including adjusting bedclothes, sheets and blankets)?: Unable Difficulty moving from lying on back to sitting on the side of the bed? : Unable Difficulty sitting down on and standing up from a chair with arms (e.g., wheelchair, bedside commode,  etc,.)?: Unable Help needed moving to and from a bed to chair (including a wheelchair)?: Total Help needed walking in hospital room?: Total Help needed climbing 3-5 steps with a railing? : Total 6 Click Score: 6    End of Session Equipment Utilized During Treatment: Gait belt Activity Tolerance: Patient tolerated treatment well Patient left: in chair;with call bell/phone within reach Nurse Communication: Mobility status;Need for lift equipment PT Visit Diagnosis: Muscle weakness (generalized) (M62.81);Difficulty in walking, not elsewhere classified (R26.2)    Time: 9371-6967 PT Time Calculation (min) (ACUTE ONLY): 33 min   Charges:   PT Evaluation $PT Eval Moderate Complexity: 1 Mod PT Treatments $Therapeutic Activity: 8-22 mins   PT G Codes:        [place]  [place] 09/04/2017, 12:12 PM

## 2017-09-04 NOTE — Progress Notes (Signed)
Patient is requesting his eye drops, Travatan Z. Notified Ferne Reus for order. Travatan Z is not available at [facility]. Patient is declining alternative eye drop and says he will request his wife to bring tomorrow. Educated patient on letting us know so we can send to pharmacy and get order to take own med at the bedside. Patient verb understanding.

## 2017-09-05 MED ORDER — CALCIUM CITRATE-VITAMIN D 500-500 MG-UNIT PO CHEW
1.5000 | CHEWABLE_TABLET | Freq: Every day | ORAL | Status: DC
  Filled 2017-09-05: qty 1.5

## 2017-09-05 MED ORDER — ROPINIROLE HCL 1 MG PO TABS
1.0000 mg | ORAL_TABLET | Freq: Every day | ORAL | Status: DC
  Administered 2017-09-06: 1 mg via ORAL
  Filled 2017-09-05: qty 1

## 2017-09-05 MED ORDER — GUAIFENESIN ER 600 MG PO TB12
600.0000 mg | ORAL_TABLET | Freq: Two times a day (BID) | ORAL | Status: DC
  Administered 2017-09-05 – 2017-09-06 (×2): 600 mg via ORAL
  Filled 2017-09-05 (×2): qty 1

## 2017-09-05 MED ORDER — VITAMIN D 1000 UNITS PO TABS
1000.0000 [IU] | ORAL_TABLET | Freq: Every day | ORAL | Status: DC
  Administered 2017-09-06: 1000 [IU] via ORAL
  Filled 2017-09-05: qty 1

## 2017-09-05 MED ORDER — METFORMIN HCL 500 MG PO TABS
500.0000 mg | ORAL_TABLET | Freq: Two times a day (BID) | ORAL | Status: DC
  Administered 2017-09-06: 500 mg via ORAL
  Filled 2017-09-05: qty 1

## 2017-09-05 MED ORDER — LOSARTAN POTASSIUM 50 MG PO TABS
100.0000 mg | ORAL_TABLET | Freq: Every day | ORAL | Status: DC
  Administered 2017-09-06: 100 mg via ORAL
  Filled 2017-09-05: qty 2

## 2017-09-05 MED ORDER — COLCHICINE 0.6 MG PO TABS
0.6000 mg | ORAL_TABLET | Freq: Every day | ORAL | Status: DC
  Administered 2017-09-06: 0.6 mg via ORAL
  Filled 2017-09-05: qty 1

## 2017-09-05 MED ORDER — ALLOPURINOL 300 MG PO TABS
300.0000 mg | ORAL_TABLET | Freq: Every day | ORAL | Status: DC
  Administered 2017-09-06: 300 mg via ORAL
  Filled 2017-09-05: qty 1

## 2017-09-05 MED ORDER — ROPINIROLE HCL 1 MG PO TABS
4.0000 mg | ORAL_TABLET | Freq: Every day | ORAL | Status: DC
  Administered 2017-09-05: 4 mg via ORAL
  Filled 2017-09-05: qty 4

## 2017-09-05 MED ORDER — CARBIDOPA-LEVODOPA 10-100 MG PO TABS
1.0000 | ORAL_TABLET | Freq: Three times a day (TID) | ORAL | Status: DC
  Administered 2017-09-05 – 2017-09-06 (×3): 1 via ORAL
  Filled 2017-09-05 (×4): qty 1

## 2017-09-05 MED ORDER — OXYMETAZOLINE HCL 0.05 % NA SOLN
1.0000 | Freq: Two times a day (BID) | NASAL | Status: DC | PRN
  Filled 2017-09-05: qty 15

## 2017-09-05 MED ORDER — HYDROCHLOROTHIAZIDE 25 MG PO TABS: 25.0000 mg | ORAL_TABLET | Freq: Every day | ORAL | Status: DC

## 2017-09-05 MED ORDER — LATANOPROST 0.005 % OP SOLN
1.0000 [drp] | Freq: Every day | OPHTHALMIC | Status: DC
  Administered 2017-09-05: 1 [drp] via OPHTHALMIC
  Filled 2017-09-05: qty 2.5

## 2017-09-05 MED ORDER — LOSARTAN POTASSIUM 50 MG PO TABS: 100.0000 mg | ORAL_TABLET | Freq: Every day | ORAL | Status: DC

## 2017-09-05 MED ORDER — ADULT MULTIVITAMIN W/MINERALS CH
1.0000 | ORAL_TABLET | Freq: Every day | ORAL | Status: DC
  Administered 2017-09-06: 1 via ORAL
  Filled 2017-09-05: qty 1

## 2017-09-05 MED ORDER — PANTOPRAZOLE SODIUM 40 MG PO TBEC
40.0000 mg | DELAYED_RELEASE_TABLET | Freq: Every day | ORAL | Status: DC
  Administered 2017-09-05: 40 mg via ORAL
  Filled 2017-09-05: qty 1

## 2017-09-05 MED ORDER — FLUTICASONE FUROATE-VILANTEROL 100-25 MCG/INH IN AEPB
1.0000 | INHALATION_SPRAY | Freq: Every day | RESPIRATORY_TRACT | Status: DC
  Administered 2017-09-06: 1 via RESPIRATORY_TRACT
  Filled 2017-09-05: qty 28

## 2017-09-05 MED ORDER — UMECLIDINIUM BROMIDE 62.5 MCG/INH IN AEPB
1.0000 | INHALATION_SPRAY | Freq: Every day | RESPIRATORY_TRACT | Status: DC
  Administered 2017-09-06: 1 via RESPIRATORY_TRACT
  Filled 2017-09-05: qty 7

## 2017-09-05 MED ORDER — ALBUTEROL SULFATE (2.5 MG/3ML) 0.083% IN NEBU: 2.5000 mg | INHALATION_SOLUTION | RESPIRATORY_TRACT | Status: DC | PRN

## 2017-09-05 MED ORDER — HYDROCHLOROTHIAZIDE 25 MG PO TABS
25.0000 mg | ORAL_TABLET | Freq: Every day | ORAL | Status: DC
  Administered 2017-09-06: 25 mg via ORAL
  Filled 2017-09-05: qty 1

## 2017-09-05 MED ORDER — SIMVASTATIN 40 MG PO TABS
40.0000 mg | ORAL_TABLET | Freq: Every day | ORAL | Status: DC
  Administered 2017-09-05: 40 mg via ORAL

## 2017-09-05 MED ORDER — ROPINIROLE HCL ER 8 MG PO TB24: 8.0000 mg | ORAL_TABLET | Freq: Every day | ORAL | Status: DC

## 2017-09-05 MED ORDER — LEVOTHYROXINE SODIUM 100 MCG PO TABS
200.0000 ug | ORAL_TABLET | Freq: Every day | ORAL | Status: DC
  Administered 2017-09-06: 200 ug via ORAL
  Filled 2017-09-05 (×2): qty 2

## 2017-09-05 MED ORDER — METOPROLOL TARTRATE 50 MG PO TABS
50.0000 mg | ORAL_TABLET | Freq: Two times a day (BID) | ORAL | Status: DC
  Administered 2017-09-05 – 2017-09-06 (×2): 50 mg via ORAL
  Filled 2017-09-05 (×2): qty 1

## 2017-09-05 MED ORDER — CLONIDINE HCL 0.1 MG PO TABS
0.1000 mg | ORAL_TABLET | Freq: Two times a day (BID) | ORAL | Status: DC
  Administered 2017-09-05 – 2017-09-06 (×2): 0.1 mg via ORAL
  Filled 2017-09-05 (×2): qty 1

## 2017-09-05 NOTE — Progress Notes (Signed)
Subjective: Patient reports overall patient feels the same denies any new numbness or tingling possibly some increased sensation in his hands.  Objective: Vital signs in last 24 hours: Temp:  [97.7 F (36.5 C)-99 F (37.2 C)] 97.9 F (36.6 C) (03/20 0813) Pulse Rate:  [88-98] 88 (03/20 0400) Resp:  [16] 16 (03/20 0020) BP: (110-176)/(63-93) 176/93 (03/20 0400) SpO2:  [91 %-100 %] 92 % (03/20 0400)  Intake/Output from previous day: 03/19 0701 - 03/20 0700 In: -  Out: 1150 [Urine:1150] Intake/Output this shift: No intake/output data recorded.  strength is improved right upper extremity 4+ to 5 out of 5. right looks to me is also improved to 4-4+ out of 5.  Lab Results: No results for input(s): WBC, HGB, HCT, PLT in the last 72 hours. BMET No results for input(s): NA, K, CL, CO2, GLUCOSE, BUN, CREATININE, CALCIUM in the last 72 hours.  Studies/Results: Dg Cervical Spine 2-3 Views  Result Date: 09/03/2017 CLINICAL DATA:  C3 through C6 ACDF EXAM: CERVICAL SPINE - 2-3 VIEW; DG C-ARM 61-120 MIN COMPARISON:  MRI 08/23/2017 FINDINGS: Two low resolution intraoperative spot views of the cervical spine. Total fluoroscopy time was 13 seconds. Radiopaque sponge material anteriorly. Surgical plate and fixating screws C3 through C6. IMPRESSION: Intraoperative fluoroscopic assistance provided during cervical spine surgery. Electronically Signed   By: Donavan Foil M.D.   On: 09/03/2017 17:38   Dg C-arm 1-60 Min  Result Date: 09/03/2017 CLINICAL DATA:  C3 through C6 ACDF EXAM: CERVICAL SPINE - 2-3 VIEW; DG C-ARM 61-120 MIN COMPARISON:  MRI 08/23/2017 FINDINGS: Two low resolution intraoperative spot views of the cervical spine. Total fluoroscopy time was 13 seconds. Radiopaque sponge material anteriorly. Surgical plate and fixating screws C3 through C6. IMPRESSION: Intraoperative fluoroscopic assistance provided during cervical spine surgery. Electronically Signed   By: Donavan Foil M.D.   On:  09/03/2017 17:38    Assessment/Plan: Continue to mobilize with physical and occupational therapy agree with  rehabilitation placement the patient is stable and that becomes available  LOS: 2 days     [name] P 09/05/2017, 8:23 AM

## 2017-09-05 NOTE — Social Work (Addendum)
[facility] is holding pt bed, CSW has faxed clinicals to [employer] for insurance authorization. CSW spoke with MD, pt will discharge tomorrow.   5:45pm- CSW still has not heard approval from [facility] [place] to f/u tomorrow, aware pt clinicals are under review.   CSW continuing to follow.  [place], [place] Work [phone]

## 2017-09-05 NOTE — NC FL2 (Signed)
[place] LEVEL OF CARE SCREENING TOOL     IDENTIFICATION  Patient Name: [name] Birthdate: [birth year] Sex: male Admission Date (Current Location): 09/03/2017  [facility] and [place] Number:  Herbalist and Address:  [facility] [place], [place] [address], [place], [place] [address]      Provider Number: [id number]  Attending Physician Name and Address:  Kary Kos, MD  Relative Name and Phone Number:  Lavert Matousek; wife [phone]    Current Level of Care: Hospital Recommended Level of Care: [place] Prior Approval Number:    Date Approved/Denied:   PASRR Number: [id number]  Discharge Plan: SNF    Current Diagnoses: Patient Active Problem List   Diagnosis Date Noted  . Myelopathy ([place]) 09/03/2017  . Acute respiratory failure with hypoxemia ([place]) 08/18/2017  . Hypokalemia 08/18/2017  . Leukocytosis 08/18/2017  . Muscle atrophy of lower extremity 08/09/2017  . Bowel and bladder incontinence 08/09/2017  . Right-sided muscle weakness 08/09/2017  . Neuropathy 08/09/2017  . 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/2017  . Abnormality of gait 07/12/2017  . Myoclonic jerkings, 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/2017  . Acquired right foot drop 07/12/2017  . UTI (urinary tract infection) due to Enterococcus 07/12/2017  . Pressure injury of skin 06/21/2017  . Acute cystitis 06/20/2017  . Generalized weakness 06/20/2017  . Dehydration 06/20/2017  . Dyspnea 12/26/2016  . Chronic cough 12/26/2016  . Hypersomnia with sleep apnea 10/30/2016  . CSA (central sleep apnea) 10/30/2016  . Wheezing symptom 10/30/2016  . CPAP/BiPAP dependent 07/01/2015  . 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 RLS (restless legs syndrome) 08/25/2014  . Primary gout 08/25/2014  . OSA on CPAP 08/25/2014  . Severe obesity (BMI >= 40) ([place]) 08/25/2014  . Obesity (BMI 30-39.9) 02/18/2013  . Sleep apnea with use of continuous positive airway  pressure (CPAP)   . 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 S/P TKR (total knee replacement) 11/29/2012  . History of thyroid cancer     Orientation RESPIRATION BLADDER Height & Weight     Self, Time, Situation, Place  Normal Incontinent, External catheter Weight: 190 lb (86.2 kg) Height:  5\' 7"  (170.2 cm)  BEHAVIORAL SYMPTOMS/MOOD NEUROLOGICAL BOWEL NUTRITION STATUS      Continent Diet(see discharge summary)  AMBULATORY STATUS COMMUNICATION OF NEEDS Skin   Total Care Verbally Surgical wounds(incision on neck with honeycomb dressing)                       Personal Care Assistance Level of Assistance  Bathing, Feeding, Dressing Bathing Assistance: Maximum assistance Feeding assistance: Independent Dressing Assistance: Maximum assistance     Functional Limitations Info  Sight, Hearing, Speech Sight Info: Impaired Hearing Info: Adequate Speech Info: Adequate    SPECIAL CARE FACTORS FREQUENCY        PT Frequency: 3x week OT Frequency: 2x week            Contractures Contractures Info: Not present    Additional Factors Info  Code Status, Allergies Code Status Info: Full Code Allergies Info: No Known Allergies           Current Medications (09/05/2017):  This is the current hospital active medication list Current Facility-Administered Medications  Medication Dose Route Frequency Provider Last Rate Last Dose  . acetaminophen (TYLENOL) tablet 650 mg  650 mg Oral Q4H PRN Kary Kos, MD       Or  .  acetaminophen (TYLENOL) suppository 650 mg  650 mg Rectal Q4H PRN Kary Kos, MD      . alum & mag hydroxide-simeth (MAALOX/MYLANTA) 200-200-20 MG/5ML suspension 30 mL  30 mL Oral Q6H PRN Kary Kos, MD      . cyclobenzaprine (FLEXERIL) tablet 10 mg  10 mg Oral TID PRN Kary Kos, MD   10 mg at 09/04/17 2119  . HYDROmorphone (DILAUDID) injection 0.5 mg  0.5 mg Intravenous Q2H PRN Kary Kos, MD      .  menthol-cetylpyridinium (CEPACOL) lozenge 3 mg  1 lozenge Oral PRN Kary Kos, MD       Or  . phenol (CHLORASEPTIC) mouth spray 1 spray  1 spray Mouth/Throat PRN Kary Kos, MD   1 spray at 09/03/17 2350  . ondansetron (ZOFRAN) tablet 4 mg  4 mg Oral Q6H PRN Kary Kos, MD       Or  . ondansetron [facility]) injection 4 mg  4 mg Intravenous Q6H PRN Kary Kos, MD      . oxyCODONE (Oxy IR/ROXICODONE) immediate release tablet 10 mg  10 mg Oral Q3H PRN Kary Kos, MD   10 mg at 09/04/17 1255  . pantoprazole (PROTONIX) EC tablet 40 mg  40 mg Oral QHS Kary Kos, MD      . sodium chloride flush (NS) 0.9 % injection 3 mL  3 mL Intravenous Q12H Kary Kos, MD   3 mL at 09/05/17 0923  . sodium chloride flush (NS) 0.9 % injection 3 mL  3 mL Intravenous PRN Kary Kos, MD         Discharge Medications: Please see discharge summary for a list of discharge medications.  Relevant Imaging Results:  Relevant Lab Results:   Additional Information SS# [place], [place]

## 2017-09-05 NOTE — Clinical Social Work Note (Signed)
Clinical Social Work Assessment  Patient Details  Name: [name] MRN: [id number] Date of Birth: 09-11-1940  Date of referral:  09/05/17               Reason for consult:  Facility Placement                Permission sought to share information with:  Facility Sport and exercise psychologist, Family Supports Permission granted to share information::  Yes, Verbal Permission Granted  Name::        Agency::  [facility]  Relationship::     Contact Information:     Housing/Transportation Living arrangements for the past 2 months:  [place], [place] of Information:  Patient Patient Interpreter Needed:  None Criminal Activity/Legal Involvement Pertinent to Current Situation/Hospitalization:  No - Comment as needed Significant Relationships:  Other Family Members Lives with:  Spouse Do you feel safe going back to the place where you live?  Yes Need for family participation in patient care:  Yes (Comment)  Care giving concerns:  Pt requires max assist for ambulation and support.    Social Worker assessment / plan:  CSW met with pt at bedside, pt frustrated and would like to discharge to [facility], feels he gets good therapy there. Pt feels that the therapy hes getting at hospital is not sufficient. Pt was living at home with wife prior to surgeries and previous hospitalizations. Pt states that he would like to get stronger and return home where he has appropriate equipment.   Employment status:  Retired Nurse, adult PT Recommendations:  [place] / Referral to community resources:  [place]  Patient/Family's Response to care: Pt amenable to return to [employer], appreciative at end of assessment for CSW support and work to get pt back to [employer].   Patient/Family's Understanding of and Emotional Response to Diagnosis, Current Treatment, and Prognosis: Pt states understanding of  diagnosis, current treatment and prognosis. Appears frustrated that therapies do not come daily at the hospital, expressed understanding that we would have to have [place] authorization for him to return to [facility]. Pt expressed reason and understanding of limitations and future expectations.   Emotional Assessment Appearance:  Appears stated age Attitude/Demeanor/Rapport:  Complaining, Engaged Affect (typically observed):  Frustrated, Pleasant Orientation:  Oriented to Self, Oriented to Place, Oriented to  Time, Oriented to Situation Alcohol / Substance use:  Not Applicable Psych involvement (Current and /or in the community):  No (Comment)  Discharge Needs  Concerns to be addressed:  Care Coordination, Discharge Planning Concerns Readmission within the last 30 days:  No Current discharge risk:  Physical Impairment, Dependent with Mobility Barriers to Discharge:  Ship broker, Continued Medical Work up   [employer], [employer] 09/05/2017, 5:50 PM

## 2017-09-05 NOTE — Progress Notes (Signed)
This Probation officer called Dr Saintclair Halsted office per patient request that he has not received any home meds in 2 days, this was also addressed this morning during Dr Saintclair Halsted  Visit, and also that he now has swelling in LUE. Call center took my name and number stating that they would have the on call Dr to contact me,. At this this I have not received a call back from either Dr.

## 2017-09-06 ENCOUNTER — Encounter (HOSPITAL_COMMUNITY): Payer: Self-pay | Admitting: Neurosurgery

## 2017-09-06 MED ORDER — HYDROCODONE-ACETAMINOPHEN 5-325 MG PO TABS: 1.0000 | ORAL_TABLET | Freq: Three times a day (TID) | ORAL | 0 refills | Status: DC | PRN

## 2017-09-06 MED ORDER — MENTHOL 3 MG MT LOZG: 1.0000 | LOZENGE | OROMUCOSAL | 12 refills | Status: DC | PRN

## 2017-09-06 MED ORDER — POLYETHYLENE GLYCOL 3350 17 G PO PACK: 17.0000 g | PACK | Freq: Every day | ORAL | 0 refills | Status: DC | PRN

## 2017-09-06 MED ORDER — CALCIUM CARBONATE-VITAMIN D 500-200 MG-UNIT PO TABS
1.0000 | ORAL_TABLET | Freq: Every day | ORAL | Status: DC
  Administered 2017-09-06: 1 via ORAL
  Filled 2017-09-06: qty 1

## 2017-09-06 MED ORDER — ACETAMINOPHEN 325 MG PO TABS: 650.0000 mg | ORAL_TABLET | ORAL | 0 refills | Status: DC | PRN

## 2017-09-06 MED ORDER — HYDROCHLOROTHIAZIDE 25 MG PO TABS: 25.0000 mg | ORAL_TABLET | Freq: Every day | ORAL | 0 refills | Status: DC

## 2017-09-06 MED ORDER — CYCLOBENZAPRINE HCL 10 MG PO TABS: 10.0000 mg | ORAL_TABLET | Freq: Three times a day (TID) | ORAL | 0 refills | Status: DC | PRN

## 2017-09-06 MED ORDER — LOSARTAN POTASSIUM 100 MG PO TABS: 100.0000 mg | ORAL_TABLET | Freq: Every day | ORAL | 0 refills | Status: DC

## 2017-09-06 MED ORDER — POLYETHYLENE GLYCOL 3350 17 G PO PACK: 17.0000 g | PACK | Freq: Every day | ORAL | Status: DC | PRN

## 2017-09-06 NOTE — Care Management Note (Signed)
Case Management Note  Patient Details  Name: KHALEEF RUBY MRN: [id number] Date of Birth: 05-Dec-1940  Subjective/Objective:   This 78 y.o. 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    PTA, pt resided at [facility]F and needed assistance with all ADLS.                  Action/Plan: Will consult CSW to facilitate return to SNF upon medical stability.  Will follow progress.    Expected Discharge Date:  09/06/17               Expected Discharge Plan:  Skilled Nursing Facility  In-House Referral:  Clinical Social Work  Discharge planning Services  CM Consult  Post Acute Care Choice:    Choice offered to:     DME Arranged:    DME Agency:     HH Arranged:    [place] Agency:     Status of Service:  Completed, signed off  If discussed at [employer], dates discussed:    Additional Comments:  09/06/17 J. Nicolena Schurman, RN, BSN Pt medically stable for discharge today.  Plan dc back to SNF today, per CSW arrangements.    Reinaldo Raddle, RN, BSN  Trauma/Neuro ICU Case Manager [phone]

## 2017-09-06 NOTE — Social Work (Signed)
Clinical Social Worker facilitated patient discharge including contacting patient family and facility to confirm patient discharge plans.  Clinical information faxed to facility and family agreeable with plan.  CSW arranged ambulance transport via [facility] to [place]   RN to call [phone] with report  prior to discharge.  Clinical Social Worker will sign off for now as social work intervention is no longer needed. Please consult [place] again if new need arises.  Alexander Mt, [place] Social Worker

## 2017-09-06 NOTE — Progress Notes (Signed)
Subjective: Patient reports doing well overall improving increased strength right upper extremities and right lower extremity.  Objective: Vital signs in last 24 hours: Temp:  [97.7 F (36.5 C)-98 F (36.7 C)] 97.8 F (36.6 C) (03/20 2337) Pulse Rate:  [77-103] 77 (03/21 0000) BP: (116-177)/(63-96) 116/63 (03/21 0000) SpO2:  [85 %-95 %] 85 % (03/21 0000)  Intake/Output from previous day: 03/20 0701 - 03/21 0700 In: 720 [P.O.:720] Out: 700 [Urine:700] Intake/Output this shift: Total I/O In: -  Out: 200 [Urine:200]  strength 4+ out of 5 upper extremities bilaterally  right lower extremity 4 out of 5  Lab Results: No results for input(s): WBC, HGB, HCT, PLT in the last 72 hours. BMET No results for input(s): NA, K, CL, CO2, GLUCOSE, BUN, CREATININE, CALCIUM in the last 72 hours.  Studies/Results: No results found.  Assessment/Plan: Postop day 3 from into cervical discectomies and fusion doing fairly well significant improvement preoperative weakness. Patient is cleared for transfer to rehabilitation.  LOS: 3 days     Deserai Cansler P 09/06/2017, 6:10 AM

## 2017-09-06 NOTE — Social Work (Addendum)
CSW spoke with [employer]. Pt is under review still, awaiting determination.   2:27pm- CSW received auth through [place], no ID number [id number] approved starting today.  CSW will be able to discharge pt when insurance authorization is received.  Alexander Mt, [place] Work [phone]

## 2017-09-06 NOTE — Discharge Summary (Addendum)
Physician Discharge Summary  Patient ID: [name] MRN: [id number] DOB/AGE: 01-27-1941 77 y.o. Estimated body mass index is 29.76 kg/m as calculated from the following:   Height as of this encounter: 5\' 7"  (1.702 m).   Weight as of this encounter: 86.2 kg (190 lb).   Admit date: 09/03/2017 Discharge date: 09/06/2017  Admission Diagnoses: cervical spondylitic myelopathy  Discharge Diagnoses: same Active Problems:   Myelopathy [facility])   Discharged Condition: good  Hospital Course: patient was admittedunderwent anterior cervical discectomies and fusion from C3-C6 and postoperatively patient progressively improved with increased strength upper and lower extremities. Patient was stable for discharge to rehabilitation on postop day 3.  Consults: Significant Diagnostic Studies: Treatments:ACDF C3-4, C4-5, C5-6. Discharge Exam: Blood pressure 116/63, pulse 77, temperature 97.8 F (36.6 C), temperature source Oral, resp. rate 16, height 5\' 7"  (1.702 m), weight 86.2 kg (190 lb), SpO2 (!) 85 %. Strength 4+ out of 5 bilateral.  right  In lower extremityweakness 4 out of 5improved from preop  Disposition: skilled nursing facility   Allergies as of 09/06/2017   No Known Allergies   Med Rec must be completed prior to using this [place]  Allergies as of 09/06/2017   No Known Allergies     Medication List    TAKE these medications   acetaminophen 325 MG tablet Commonly known as:  TYLENOL Take 2 tablets (650 mg total) by mouth every 4 (four) hours as needed for mild pain ((score 1 to 3) or temp > 100.5).   allopurinol 300 MG tablet Commonly known as:  ZYLOPRIM Take 300 mg by mouth daily.   calcium citrate-vitamin D 315-200 MG-UNIT tablet Commonly known as:  CITRACAL+D Take 1 tablet by mouth daily. 750 mg   carbidopa-levodopa 10-100 MG tablet Commonly known as:  SINEMET IR Take 1 tablet by mouth 2 (two) times daily. What changed:    when to take this  additional  instructions   cholecalciferol 1000 units tablet Commonly known as:  VITAMIN D Take 1,000 Units by mouth daily.   cloNIDine 0.1 MG tablet Commonly known as:  CATAPRES Take 0.1 mg by mouth 2 (two) times daily.   COLCRYS 0.6 MG tablet Generic drug:  colchicine Take 0.6 mg by mouth daily.   cyclobenzaprine 10 MG tablet Commonly known as:  FLEXERIL Take 1 tablet (10 mg total) by mouth 3 (three) times daily as needed for muscle spasms.   fish oil-omega-3 fatty acids 1000 MG capsule Take 3 g by mouth daily.   fluticasone furoate-vilanterol 100-25 MCG/INH Aepb Commonly known as:  BREO ELLIPTA Inhale 1 puff into the lungs daily.   guaifenesin 400 MG Tabs tablet Commonly known as:  HUMIBID E Take 400 mg by mouth 2 (two) times daily as needed (congestion).   hydrochlorothiazide 25 MG tablet Commonly known as:  HYDRODIURIL Take 1 tablet (25 mg total) by mouth daily.   HYDROcodone-acetaminophen 5-325 MG tablet Commonly known as:  NORCO/VICODIN Take 1 tablet by mouth 3 (three) times daily as needed (pain).   levothyroxine 200 MCG tablet Commonly known as:  SYNTHROID, LEVOTHROID Take 200 mcg by mouth daily before breakfast.   losartan 100 MG tablet Commonly known as:  COZAAR Take 1 tablet (100 mg total) by mouth daily.   losartan-hydrochlorothiazide 100-25 MG tablet Commonly known as:  HYZAAR Take 1 tablet by mouth daily.   menthol-cetylpyridinium 3 MG lozenge Commonly known as:  CEPACOL Take 1 lozenge (3 mg total) by mouth as needed for sore throat (sore throat).  metFORMIN 500 MG tablet Commonly known as:  GLUCOPHAGE Take 1 tablet (500 mg total) by mouth 2 (two) times daily with a meal.   metoprolol tartrate 50 MG tablet Commonly known as:  LOPRESSOR Take 50 mg by mouth 2 (two) times daily.   multivitamin tablet Take 1 tablet by mouth daily.   nitroGLYCERIN 0.4 MG SL tablet Commonly known as:  NITROSTAT Place 0.4 mg every 5 (five) minutes as needed under the  tongue for chest pain.   oxymetazoline 0.05 % nasal spray Commonly known as:  AFRIN Place 2 sprays into the nose 2 (two) times daily as needed for congestion.   pantoprazole 40 MG tablet Commonly known as:  PROTONIX Take 40 mg by mouth every evening.   predniSONE 10 MG tablet Commonly known as:  DELTASONE Take 4 tablets for 3 days; Take 3 tablets for 4 days; Take 2 tablets for 3 days; Take 1 tablet for 4 days   rizatriptan 10 MG tablet Commonly known as:  MAXALT Take 10 mg by mouth as needed for migraine. May repeat in 2 hours if needed   rOPINIRole 1 MG tablet Commonly known as:  REQUIP TAKE 1 TABLET BY MOUTH IN THE MORNING AND 4 TABLETS AT BEDTIME What changed:  See the new instructions.   simvastatin 40 MG tablet Commonly known as:  ZOCOR   TRAVATAN Z 0.004 % Soln ophthalmic solution Generic drug:  Travoprost (BAK Free) Place 1 drop into both eyes.   umeclidinium bromide 62.5 MCG/INH Aepb Commonly known as:  INCRUSE ELLIPTA Inhale 1 puff into the lungs daily.   VENTOLIN HFA 108 (90 Base) MCG/ACT inhaler Generic drug:  albuterol Inhale 1-2 puffs into the lungs every 4 (four) hours as needed for wheezing or shortness of breath.         Signed: Daquawn Seelman P 09/06/2017, 6:14 AM

## 2017-09-06 NOTE — Clinical Social Work Placement (Signed)
   CLINICAL SOCIAL WORK PLACEMENT  NOTE [facility] Room 3250 RN to call report to [phone]  Date:  09/06/2017  Patient Details  Name: [name] MRN: [id number] Date of Birth: December 22, 1940  Clinical Social Work is seeking post-discharge placement for this patient at the [place] level of care (*CSW will initial, date and re-position this form in  chart as items are completed):  Yes   Patient/family provided with [place] Work Department's list of facilities offering this level of care within the geographic area requested by the patient (or if unable, by the patient's family).  Yes   Patient/family informed of their freedom to choose among providers that offer the needed level of care, that participate in Medicare, Medicaid or managed care program needed by the patient, have an available bed and are willing to accept the patient.  Yes   Patient/family informed of [facility]'s ownership interest in [facility] and [facility], as well as of the fact that they are under no obligation to receive care at these facilities.  PASRR submitted to EDS on       PASRR number received on       Existing PASRR number confirmed on 09/05/17     FL2 transmitted to all facilities in geographic area requested by pt/family on 09/05/17     FL2 transmitted to all facilities within larger geographic area on       Patient informed that his/her managed care company has contracts with or will negotiate with certain facilities, including the following:        Yes   Patient/family informed of bed offers received.  Patient [facility] bed at [facility]     Physician recommends and patient chooses bed at      Patient to be transferred to [facility] on 09/06/17.  Patient to be transferred to facility by [facility]     Patient family notified on 09/06/17 of transfer.  Name of family member notified:  [name], wife     PHYSICIAN    Additional Comment:    _______________________________________________ Alexander Mt, [place] 09/06/2017, 2:42 PM

## 2017-09-06 NOTE — Progress Notes (Signed)
Patient is discharged from room 4NP10 at this time. Alert and in stable condition. IV site d/c'd and report given to receiving nurse Nia Poteat, LPN at [facility] with all questions answered. Transported out of unit via stretcher by [facility] with all belongings at side.

## 2017-09-06 NOTE — Progress Notes (Signed)
Unable to print AVS at this time. MD notified and is in surgery at this time.

## 2017-09-28 ENCOUNTER — Other Ambulatory Visit (HOSPITAL_COMMUNITY): Payer: Self-pay | Admitting: Internal Medicine

## 2017-09-28 DIAGNOSIS — R1310 Dysphagia, unspecified: Secondary | ICD-10-CM

## 2017-10-01 ENCOUNTER — Ambulatory Visit (HOSPITAL_COMMUNITY)
Admission: RE | Admit: 2017-10-01 | Discharge: 2017-10-01 | Disposition: A | Discharge status: Home / Self Care | Payer: Medicare Other | Source: Ambulatory Visit | Attending: Internal Medicine | Admitting: Internal Medicine

## 2017-10-01 DIAGNOSIS — R1312 Dysphagia, oropharyngeal phase: Secondary | ICD-10-CM | POA: Insufficient documentation

## 2017-10-01 DIAGNOSIS — R1310 Dysphagia, unspecified: Secondary | ICD-10-CM

## 2017-10-01 IMAGING — RF DG SWALLOWING FUNCTION
1 series · 17 of 24 positions shown · non-contrast
Comparison: None.

CLINICAL DATA: Dysphagia.

EXAM:
MODIFIED BARIUM SWALLOW
TECHNIQUE: Different consistencies of barium were administered orally to the
patient by the Speech Pathologist. Imaging of the pharynx was
performed in the lateral projection.
FLUOROSCOPY TIME:  Fluoroscopy Time:  3 minutes 50 seconds

[Series 1: run · 32 acquisitions, 17 frames shown]
[im 1/32]
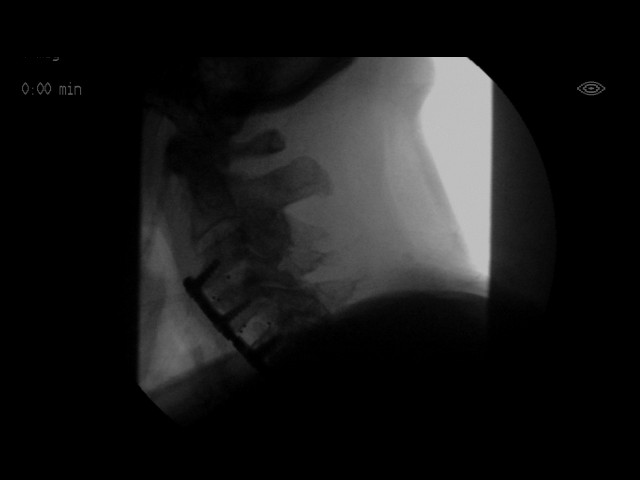
[im 3/32]
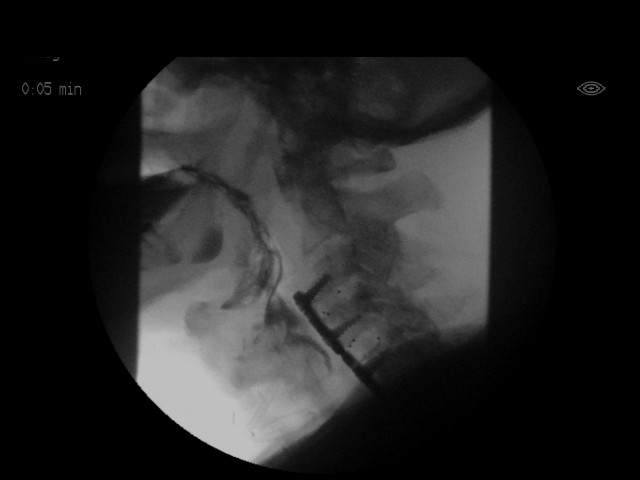
[im 5/32]
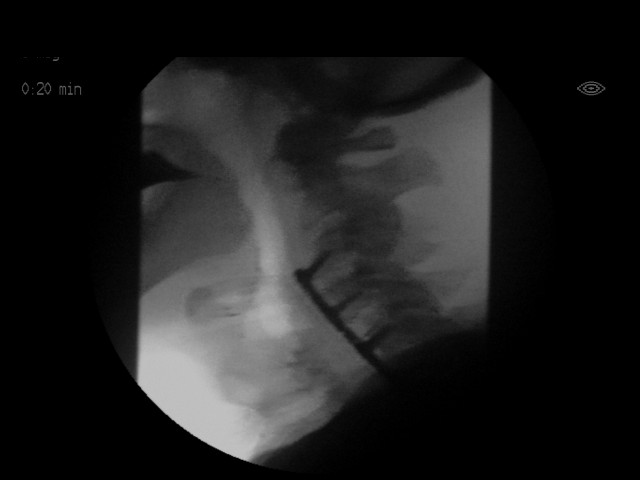
[im 6/32]
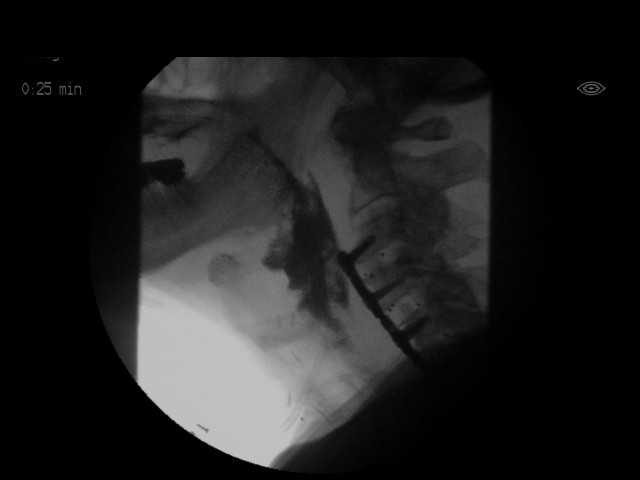
[im 9/32]
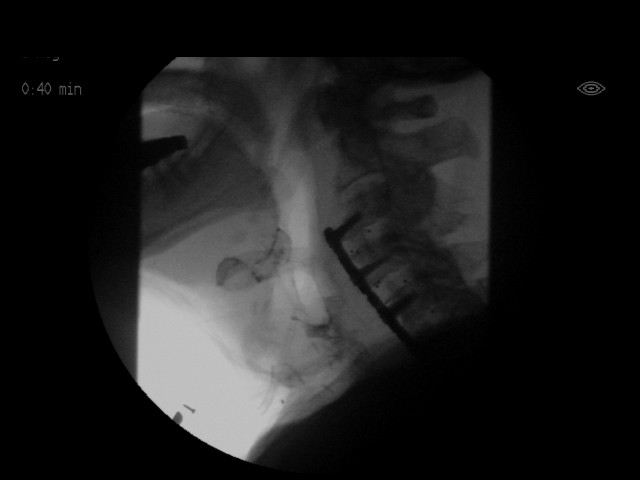
[im 10/32]
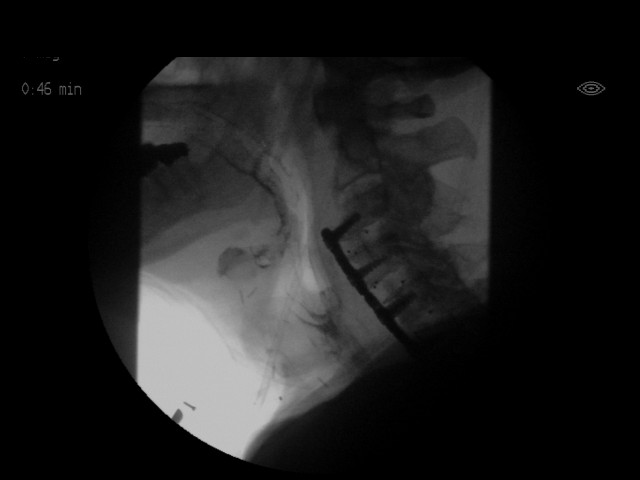
[im 13/32]
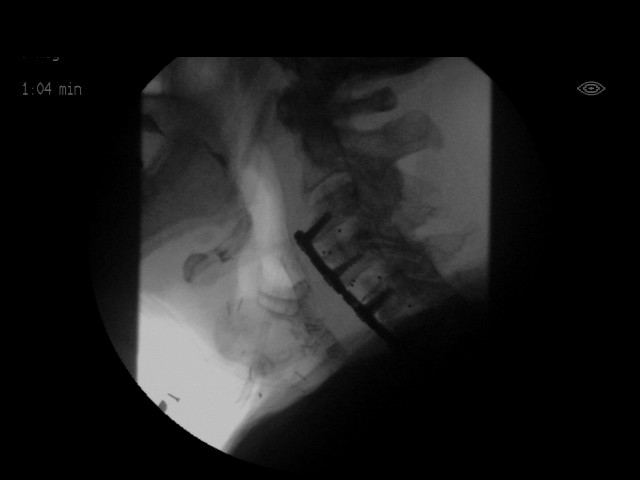
[im 14/32]
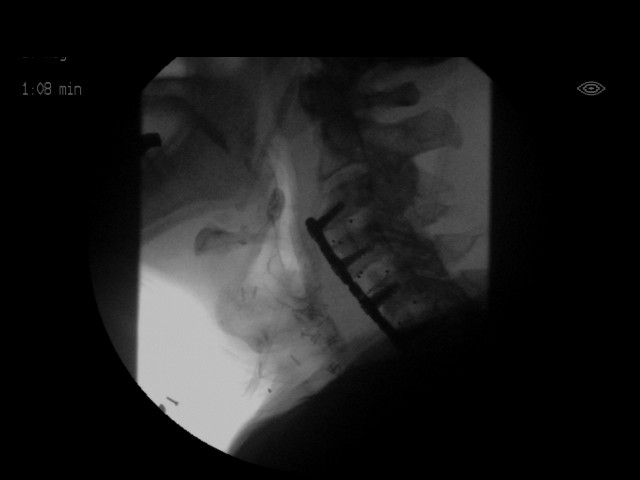
[im 17/32]
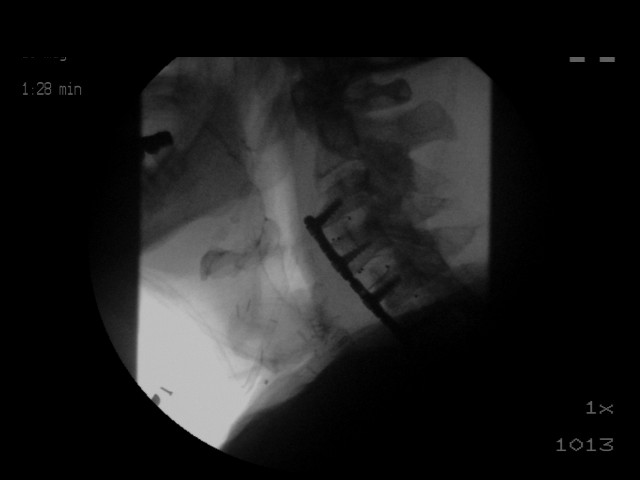
[im 18/32]
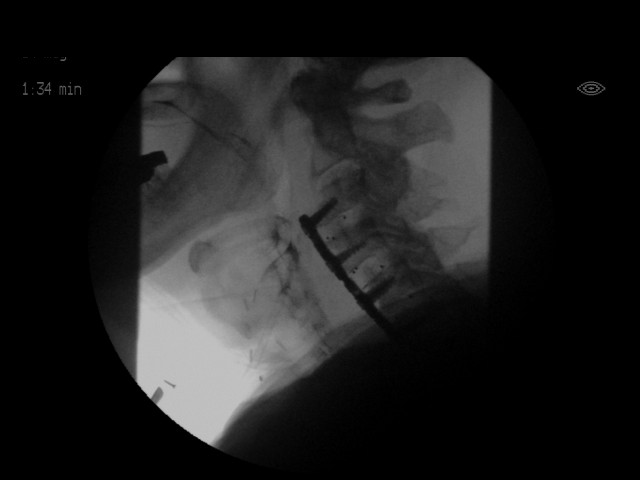
[im 19/32]
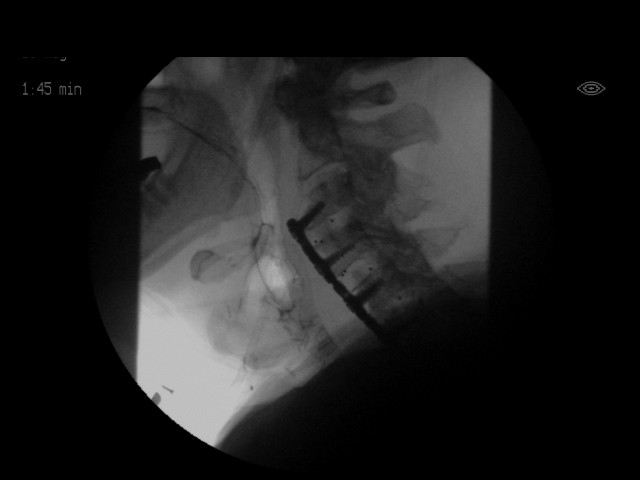
[im 22/32]
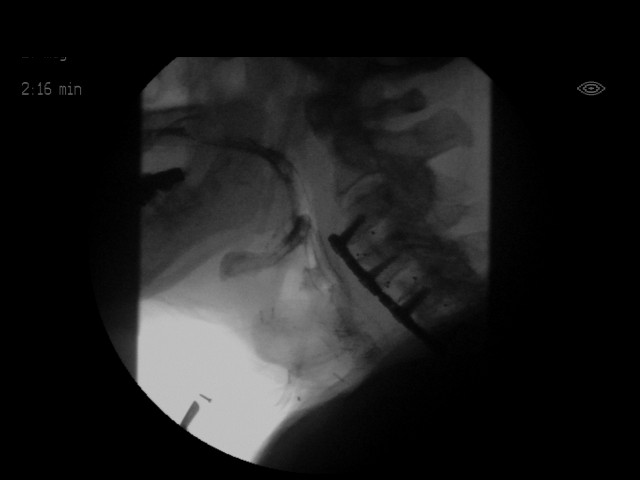
[im 23/32]
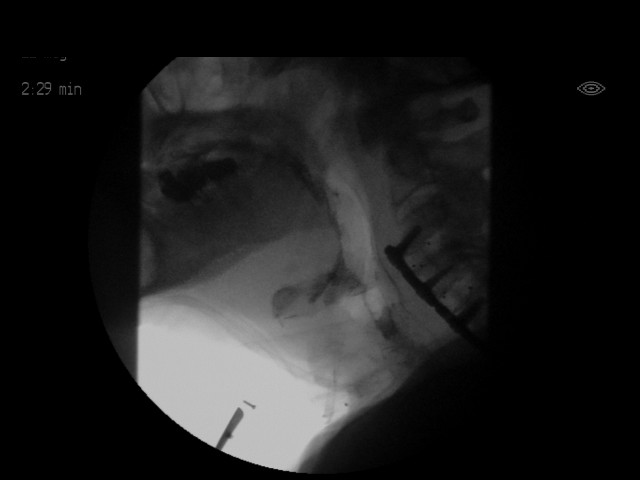
[im 26/32]
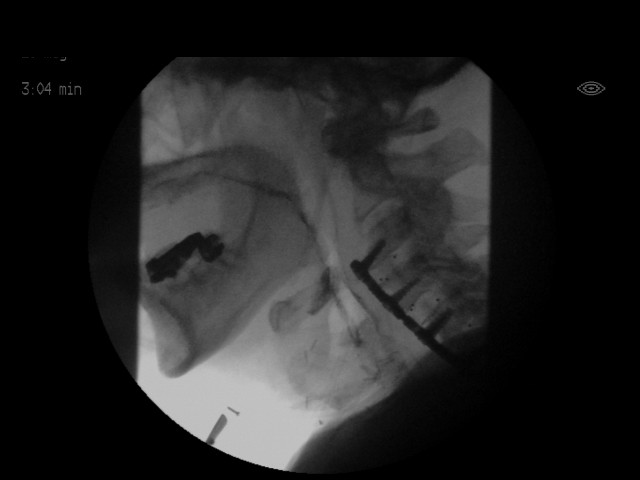
[im 27/32]
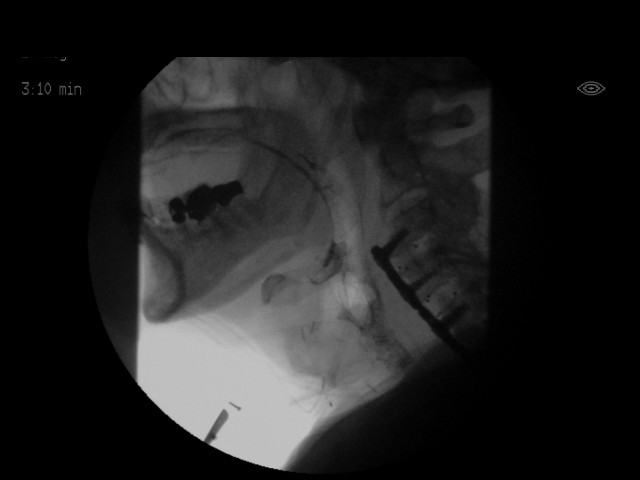
[im 29/32]
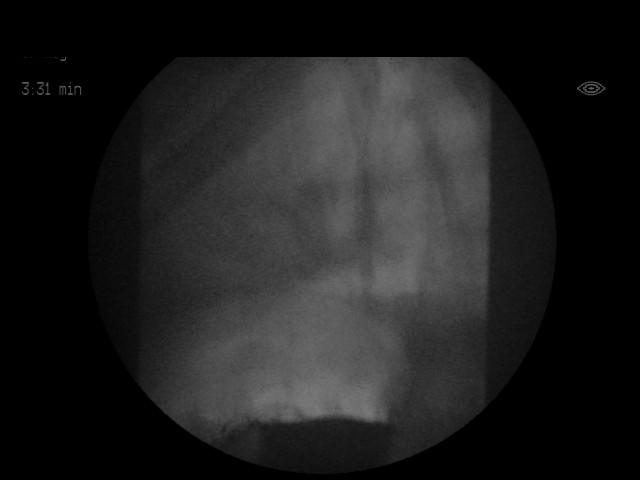
[im 32/32]
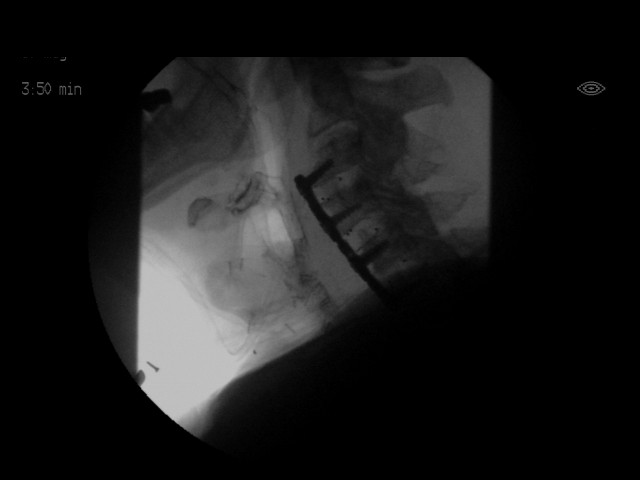

[17 of 24 positions shown; findings below may reference images not displayed]

FINDINGS: Thin liquid-occasional flash laryngeal penetration and asymptomatic
aspiration. Pooling in the valleculae and piriform sinuses that
clears with repeat swallowing.

Pure- within normal limits

Pure with protein bar: Within normal limits

Barium tablet -  within normal limits
IMPRESSION: Flash laryngeal penetration and asymptomatic aspiration with thin
liquids, felt to be due to spill from the pyriform sinuses.

Please refer to the Speech Pathologists report for complete details
and recommendations.

## 2017-10-01 NOTE — Progress Notes (Signed)
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   Procedure on 3/18, d/c to [facility] on 3/21. No SLP notes in chart.   Type of Study MBS-Modified Barium Swallow Study  Previous Swallow Assessment swallow eval at SNF  Diet Prior to this Study Thin liquids;Dysphagia 3 (soft)  Respiratory Status Nasal cannula  History of Recent Intubation No  Behavior/Cognition Alert;Cooperative;Pleasant mood  Oral Cavity Assessment WFL  Oral Care Completed by SLP No  Oral Cavity - Dentition Edentulous;Poor condition (no top dentition)  Vision Functional for self feeding  Self-Feeding Abilities Needs set up  Patient Positioning Upright in chair  Baseline Vocal Quality Hoarse  Volitional Cough Strong  Volitional Swallow Able to elicit  Anatomy Other (Comment) (ACDF 3-5, stents)  Oral Motor/Sensory Function  Overall Oral Motor/Sensory Function WFL  Oral Preparation/Oral Phase  Oral Phase Impaired  Oral - Thin  Oral - Thin Cup WFL  Oral - Thin Straw WFL  Oral - Solids  Oral - Regular Piecemeal swallowing;Decreased bolus cohesion;Impaired mastication  Oral - Puree Piecemeal swallowing  Oral - Pill WFL  Pharyngeal Phase  Pharyngeal Phase Impaired  Pharyngeal - Thin  Pharyngeal- Thin Cup Reduced airway/laryngeal closure;Penetration/Apiration after swallow;Trace aspiration;Pharyngeal residue - pyriform;Lateral channel residue;Compensatory strategies attempted (with notebox)  Pharyngeal- Thin Straw Reduced airway/laryngeal closure;Penetration/Apiration after swallow;Trace aspiration;Pharyngeal residue - pyriform;Lateral channel residue;Compensatory strategies attempted (with notebox)  Pharyngeal Material enters airway, passes BELOW cords without attempt by patient to eject out (silent aspiration);Material enters airway, CONTACTS cords and not  ejected out  Pharyngeal Material enters airway, passes BELOW cords without attempt by patient to eject out (silent aspiration);Material enters airway, CONTACTS cords and not ejected out;Material enters airway, remains ABOVE vocal cords and not ejected out;Material does not enter airway  Pharyngeal - Solids  Pharyngeal- Regular Pharyngeal residue - pyriform;Pharyngeal residue - valleculae  Pharyngeal- Puree Pharyngeal residue - valleculae;Pharyngeal residue - pyriform  Clinical Impression  Clinical Impression Pt demonstrates a mild to moderate dysphagia with oral and oropharyngeal deficits. Prolonged but thorough mastication with sticky solids observed with complete bolus transit when given extra time. Oropharyngeal function chracterized by mild changes in structure suspected following ACDF and narrowing of pharynx with presence of large hardware behind posterior pharyngeal wall (baseline function unknown). There is mild base of tongue residue, mild lateral channel residue and mild pyriform residue though no apparent weakness of musculature observed. Liquids appear entraped and bolus flow is abnormal. There is concern for possible reduced adduction of arytneoids given occasional episodes of trace penetration during the swallow, particularly given pts dysphonia. There is also trace penetration/aspiration after the swallow due trace spill from pooled liquids in pyriforms. Overall, best strategies are two swallows with intermittent throat clearing. Pt able to follow direction with min verbal cues and benefits from independent use of straw with one sip at a time. Most severe aspiration occurred with over-large cup sips given decreased motor control with arm. Discussed possible f/u with ENT in the future for potential laryngeal impairment.   SLP Visit Diagnosis Dysphagia, oropharyngeal phase (R13.12)  Impact on safety and function Mild aspiration risk  Swallow Evaluation Recommendations  SLP Diet Recommendations  Thin liquid;Regular solids  Liquid Administration via Straw  Medication Administration Whole meds with liquid  Supervision Patient able to self feed;Intermittent supervision to cue for compensatory strategies  Compensations Slow rate;Small sips/bites;Clear throat intermittently;Multiple dry  swallows after each bite/sip  Treatment Plan  Oral Care Recommendations Oral care BID  Treatment Recommendations Therapy as outlined in treatment plan below  Follow up Recommendations Skilled Nursing facility  Individuals Consulted  Consulted and Agree with Results and Recommendations Patient;Family member/caregiver  Family Member Consulted wife  SLP Time Calculation  SLP Start Time (ACUTE ONLY) 1120  SLP Stop Time (ACUTE ONLY) 1200  SLP Time Calculation (min) (ACUTE ONLY) 40 min  SLP Evaluations  $ SLP Speech Visit 1 Visit  SLP Evaluations  $Outpatient MBS Swallow 1 Procedure  $Swallowing Treatment 1 Procedure

## 2018-01-29 ENCOUNTER — Other Ambulatory Visit: Payer: Self-pay | Admitting: Neurology

## 2018-01-29 ENCOUNTER — Telehealth: Payer: Self-pay | Admitting: Neurology

## 2018-01-29 DIAGNOSIS — R292 Abnormal reflex: Secondary | ICD-10-CM

## 2018-01-29 DIAGNOSIS — R29898 Other symptoms and signs involving the musculoskeletal system: Secondary | ICD-10-CM

## 2018-01-29 DIAGNOSIS — M50022 Cervical disc disorder at C5-C6 level with myelopathy: Secondary | ICD-10-CM

## 2018-01-29 DIAGNOSIS — M4802 Spinal stenosis, cervical region: Secondary | ICD-10-CM

## 2018-01-29 NOTE — Telephone Encounter (Signed)
Order placed for referral for out pt PT

## 2018-01-29 NOTE — Telephone Encounter (Signed)
Merry Proud PT with [facility] called stating they are wanting to move the pt to out pt PT. Requesting Dr. Brett Fairy send a referral to Neuro rehab. Merry Proud can be rached at [phone]

## 2018-02-12 ENCOUNTER — Ambulatory Visit: Payer: Medicare Other | Attending: Neurology | Admitting: Physical Therapy

## 2018-02-12 ENCOUNTER — Encounter: Payer: Self-pay | Admitting: Physical Therapy

## 2018-02-12 ENCOUNTER — Other Ambulatory Visit: Payer: Self-pay

## 2018-02-12 DIAGNOSIS — R2681 Unsteadiness on feet: Secondary | ICD-10-CM

## 2018-02-12 DIAGNOSIS — Z9181 History of falling: Secondary | ICD-10-CM

## 2018-02-12 DIAGNOSIS — M6249 Contracture of muscle, multiple sites: Secondary | ICD-10-CM

## 2018-02-12 DIAGNOSIS — M545 Low back pain, unspecified: Secondary | ICD-10-CM

## 2018-02-12 DIAGNOSIS — M6281 Muscle weakness (generalized): Secondary | ICD-10-CM

## 2018-02-12 DIAGNOSIS — G8929 Other chronic pain: Secondary | ICD-10-CM | POA: Insufficient documentation

## 2018-02-12 DIAGNOSIS — R293 Abnormal posture: Secondary | ICD-10-CM

## 2018-02-12 DIAGNOSIS — R2689 Other abnormalities of gait and mobility: Secondary | ICD-10-CM

## 2018-02-13 ENCOUNTER — Ambulatory Visit: Payer: Medicare Other | Admitting: Physical Therapy

## 2018-02-13 ENCOUNTER — Encounter: Payer: Self-pay | Admitting: Physical Therapy

## 2018-02-13 DIAGNOSIS — G8929 Other chronic pain: Secondary | ICD-10-CM

## 2018-02-13 DIAGNOSIS — R293 Abnormal posture: Secondary | ICD-10-CM

## 2018-02-13 DIAGNOSIS — M6281 Muscle weakness (generalized): Secondary | ICD-10-CM

## 2018-02-13 DIAGNOSIS — M6249 Contracture of muscle, multiple sites: Secondary | ICD-10-CM

## 2018-02-13 DIAGNOSIS — M545 Low back pain: Secondary | ICD-10-CM

## 2018-02-13 NOTE — Therapy (Signed)
[name] [address] [place], [place], [address] Phone: [phone]   Fax:  [phone]  Physical Therapy Evaluation  Patient Details  Name: [name] MRN: [id number] Date of Birth: 1941/06/01 Referring Provider: Larey Seat, MD   Encounter Date: 02/12/2018  PT End of Session - 02/12/18 1318    Visit Number  1    Number of Visits  26    Date for PT Re-Evaluation  05/10/18    Authorization Type  [employer] Pt has met $5900 oop, full coverage,     PT Start Time  6    PT Stop Time  1320    PT Time Calculation (min)  50 min    Equipment Utilized During Treatment  Gait belt    Activity Tolerance  Patient tolerated treatment well;Patient limited by fatigue    Behavior During Therapy  WFL for tasks assessed/performed       Past Medical History:  Diagnosis Date   Arthritis    Barrett esophagus    Bronchitis    CAD (coronary artery disease)    pt's wife denies any cardiac history on pt.   Cancer [facility])    thyroid   Carotid artery occlusion    Chronic kidney disease    denies   Constipation    COPD (chronic obstructive pulmonary disease) ([place])    Diabetes mellitus without complication ([place])    Dyspnea    GERD (gastroesophageal reflux disease)    Headache    migraines   History of thyroid cancer 2008   History of thyroid cancer    Hypertension    OSA (obstructive sleep apnea)    Pneumonia    Restless leg    S/P tendon repair    left thumb   Sleep apnea with use of continuous positive airway pressure (CPAP)    2011 [facility] , AHI  77cn central and obstrcutive. 16 cm water , 3 cm EPR.    Thoracic ascending aortic aneurysm (HCC)    4.2 cm ascending TAA 08/18/17 CTA. Annual imaging recommended.   Thyroid disease    Ulcer    Peptic ulcer disease    Past Surgical History:  Procedure Laterality Date   ANTERIOR CERVICAL DECOMP/DISCECTOMY FUSION N/A 09/03/2017   Procedure: ACDF - C3-C4 - C4-C5 - C5-C6;  Surgeon: Kary Kos, MD;  Location: [facility];  Service: Neurosurgery;  Laterality: N/A;   CAROTID ENDARTERECTOMY Left January 03, 2006   Dr. Amedeo Plenty   COLONOSCOPY     EYE SURGERY Bilateral    cataract surgery with lens implants   game keepers thumb Right    JOINT REPLACEMENT Left    knee X 2   ROTATOR CUFF REPAIR Right    THYROIDECTOMY  2008    There were no vitals filed for this visit.   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 He underwent ACDF C3-6 09/03/2017. He had [facility] through last week and they recommended further services at outpatient Neurorehab for further progress.      Patient is accompained by:  Family member   wife, [name]   Pertinent History  CAD, progressive muscular dystrophy, DM2, COPD, hypoxic respiratory failure, HTN,    Patient Stated Goals  To walk in home & community, wife wants  him to be able to care for himself if something happens to her    Currently in Pain?  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  2   fell asleep & fell out of w/c, Knot on head with 1st   Has the patient had a decrease in activity level because of a fear of falling?   No    Is the patient reluctant to leave their home because of a fear of falling?   No   uses w/c     Home Social worker  Private residence    Living Arrangements  Spouse/significant other;Other  (Comment)   paid caregiver 3hrs in am & 3 hrs in pm 7days/wk   Type of [place] entrance   3 (right rail) -5 steps (2 wide rails) at other entrance   [place]  One level   6" step into shower   Home Equipment  Transport chair;Wheelchair - [employer] - 2 wheels;Walker - 4 wheels;Cane - single point;Bedside commode;Tub bench;Grab bars - tub/shower   BSC as raised toilet seat     Prior Function   Level of Independence  Independent;Independent with household mobility without device;Independent with community mobility without device   occassional cane use in community   Vocation  Retired    Leisure  yard walk, Clinical biochemist, gold mining      Posture/Postural Control   Posture/Postural Control  Postural limitations    Postural Limitations  Rounded Shoulders;Forward head;Increased thoracic kyphosis;Decreased lumbar lordosis;Posterior pelvic tilt;Flexed trunk   wide base     ROM / Strength   AROM / PROM / Strength  PROM;Strength      PROM   Overall PROM   Deficits    Overall PROM Comments  UE limitations to be assessed by OT;    PROM Assessment Site  Hip;Knee;Ankle    Right/Left Hip  Right;Left    Left Hip Extension  -30   supine   Left Hip Flexion  80    Right/Left Knee  Right;Left    Right Knee Extension  -38   supine   Left Knee Extension  -12   supine   Right/Left Ankle  Right;Left    Right Ankle Dorsiflexion  -13    Left Ankle Dorsiflexion  -4      Strength   Overall Strength  Deficits    Strength Assessment Site  Hip;Knee;Ankle    Right/Left Hip  Right;Left    Right Hip Flexion  3-/5   in available range   Right Hip Extension  2-/5   in available range   Right Hip ABduction  2-/5   in available range   Right Hip ADduction  2/5   from abduction to neutral   Left Hip Flexion  3+/5   in available range   Left Hip Extension  2-/5   in available range   Left Hip ABduction  2-/5   in available range   Left Hip ADduction  2/5   from  abduction to neutral   Right/Left Knee  Right;Left    Right Knee Flexion  2/5   in available range, tested supine & seated   Right Knee Extension  3-/5    Left Knee Flexion  3-/5   in available range tested supine & seated   Left Knee Extension  3-/5    Right/Left Ankle  Right;Left    Right Ankle Dorsiflexion  3-/5    Left Ankle Dorsiflexion  3-/5      Right Hip   Right Hip Extension  -40   supine     Flexibility   Soft Tissue Assessment /Muscle Length  yes    Hamstrings  seated in w/c with hips ~80*: knee extension right -49* & left -27*      Bed Mobility   Bed Mobility  Supine to Sit;Sit to Supine    Supine to Sit  Moderate Assistance - Patient 50-74%   PT total assist with LEs   Sit to Supine  Minimal Assistance - Patient > 75%   PT total assist with LEs     Transfers   Transfers  Sit to Stand;Stand to Sit;Lateral/Scoot Transfers    Sit to Stand  3: Mod assist;With upper extremity assist;With armrests;From chair/3-in-1   to RW for stabilization, significant increased time    Sit to Stand Details (indicate cue type and reason)  increased time (~1 minute) required to extend knees to his max ability     Stand to Sit  4: Min assist;With upper extremity assist;With armrests;To chair/3-in-1   from [employer] Transfers  --    Lateral/Scoot Transfers  3: Mod assist;4: Min guard;5: Supervision;With armrests removed    Lateral/Scoot Transfer Details (indicate cue type and reason)  to his left w/c to mat (downhill ~2") with min guard/assist to supervision & increased time;  to his right mat to w/c uphill 2" to w/c & another 2" onto cushion, moderate assist.       Ambulation/Gait   Ambulation/Gait  Yes    Ambulation/Gait Assistance  2: Max assist   2 people for safety   Ambulation/Gait Assistance Details  at end of session, PT attempted gait but left knee buckled on first step requiring controlled lowering into w/c.  Pt & wife report gait with HHPT up to 20' with RW.        Balance   Balance Assessed  Yes      Static Standing Balance   Static Standing - Balance Support  Bilateral upper extremity supported   RW support   Static Standing - Level of Assistance  4: Min assist    Static Standing Balance -  Activities   Romberg - Eyes Opened    Static Standing - Comment/# of Minutes  2 minutes      Dynamic Standing Balance   Dynamic Standing - Balance Support  Bilateral upper extremity supported;During functional activity;Left upper extremity supported   quick reach with RUE dominant hand   Dynamic Standing - Level of Assistance  4: Min assist    Dynamic Standing - Balance Activities  Head nods;Head turns;Eyes opn;Reaching for objects    Dynamic Standing - Comments  BUE support scanning cervical movement only (no thoracic) to look right/left & up/down;  Reaching 1" anterior to RW (within arm's length) quick action (<2 second release of RW)  Objective measurements completed on examination: See above findings.                PT Short Term Goals - 02/13/18 0809      PT SHORT TERM GOAL #1   Title  Patient & wife demonstrate & verbalize understanding of initial HEP. (All STGs Target Date: 03/15/2018)    Time  1    Period  Months    Status  New    Target Date  03/15/18      PT SHORT TERM GOAL #2   Title  Patient performs scooting transfers right & left between level surfaces with minimal guard.    Time  1    Period  Months    Status  New    Target Date  03/15/18      PT SHORT TERM GOAL #3   Title  Patient sit to/from supine with patient 50% assist with LE movements.     Time  1    Period  Months    Status  New    Target Date  03/15/18      PT SHORT TERM GOAL #4   Title  Sit to/from stand w/c to sink support, static 2 minutes & scans right/left, up/down with minimal guard.     Time  1    Period  Months    Status  New    Target Date  03/15/18      PT SHORT TERM GOAL #5   Title  Patient ambulates 37' with RW with  moderate assist.     Time  1    Period  Months    Status  New    Target Date  03/15/18        PT Long Term Goals - 02/13/18 0758      PT LONG TERM GOAL #1   Title  Patient & wife demonstrate & verbalize understanding of ongoing exercise / fitness plan including community programs. (All LTGs Target Date: 05/14/2018)    Time  3    Period  Months    Status  New    Target Date  05/14/18      PT LONG TERM GOAL #2   Title  w/c to bed (mat at similar ht to his bed) scooting or squat pivot transfer modified independent.    Time  3    Period  Months    Status  New    Target Date  05/14/18      PT LONG TERM GOAL #3   Title  Sit to/from supine modified independent.    Time  3    Period  Months    Status  New    Target Date  05/14/18      PT LONG TERM GOAL #4   Title  Sit to/from stand chairs with armrests to RW with supervision.    Time  3    Period  Months    Status  New    Target Date  05/14/18      PT LONG TERM GOAL #5   Title  Patient standing balance with RW with BUE support staticly 2 minutes, scans environment and reaches within length of arm for objects with supervision.     Time  3    Period  Months    Status  New    Target Date  05/14/18      Additional Long Term Goals   Additional Long Term Goals  Yes  PT LONG TERM GOAL #6   Title  Patient ambulates 22' with RW with minA.     Time  3    Period  Months    Status  New    Target Date  05/14/18             Plan - 02/13/18 0702    Clinical Impression Statement  This 77yo male has significant contractors of BLEs, BUEs & trunk causing poor posture seated, standing & supine. He has significant weakness in BUEs, BLEs & trunk muscles impacting all functions including transfers, bed mobility, standing balance & gait. Transfers are dependent: scooting lateral min guard to left & downhill and moderate assist to right & uphill 2-4. Sit to stand with minimal assist but significant increased time (1 minute) to  extend knees to his maximal range. Standing balance with walker support posture flexed trunk with head forward, rounded shoulders and hip/knee flexion (squat) bilaterally. Patient able to stand static 2 minutes with minA and reaches 1 anterior to walker (within arms length) with moderate assist. PT attempted gait at end of session but he was fatigued and Left knee buckled requiring controlled lowering into his w/c. Patient and wife reported patient ambulating with HHPT last week up to ~20. Patient would benefit from skilled physical therapy services to improve mobility & function further.     History and Personal Factors relevant to plan of care:  Lives alone with elderly wife, paid aid in am to assist with getting him up & in pm to assist with getting to bed.     Clinical Presentation  Evolving    Clinical Presentation due to:  high fall risk, dependency in all mobility, weakness all extremities & contractures,     Clinical Decision Making  Moderate    Rehab Potential  Good    Clinical Impairments Affecting Rehab Potential  weakness & ROM impaired 4 extremities & trunk; dependency transfers, bed mobility, standing & gait    PT Frequency  2x / week    PT Duration  Other (comment)   13 weeks (90 days)   PT Treatment/Interventions  ADLs/Self Care Home Management;Canalith Repostioning;Moist Heat;DME Instruction;Gait training;Stair training;Functional mobility training;Therapeutic activities;Therapeutic exercise;Balance training;Neuromuscular re-education;Patient/family education;Manual techniques;Passive range of motion;Vestibular    PT Next Visit Plan  review current HEP from [facility] and update (sitting & in bed); standing & gait in parallel bars with 2 people.    Consulted and Agree with Plan of Care  Patient;Family member/caregiver    Family Member Consulted  wife       Patient will benefit from skilled therapeutic intervention in order to improve the following deficits and impairments:  Abnormal  gait, Cardiopulmonary status limiting activity, Decreased activity tolerance, Decreased balance, Decreased endurance, Decreased knowledge of use of DME, Decreased mobility, Decreased range of motion, Decreased strength, Difficulty walking, Dizziness, Impaired flexibility, Impaired UE functional use, Postural dysfunction  Visit Diagnosis: Contracture of muscle, multiple sites  Muscle weakness (generalized)  Chronic midline low back pain without sciatica  History of falling  Abnormal posture  Unsteadiness on feet  Other abnormalities of gait and mobility     Problem List Patient Active Problem List   Diagnosis Date Noted   Myelopathy ([place]) 09/03/2017   Acute respiratory failure with hypoxemia (HCC) 08/18/2017   Hypokalemia 08/18/2017   Leukocytosis 08/18/2017   Muscle atrophy of lower extremity 08/09/2017   Bowel and bladder incontinence 08/09/2017   Right-sided muscle weakness 08/09/2017   Neuropathy 08/09/2017   Neurogenic claudication due  to lumbar spinal stenosis 08/09/2017   Abnormality of gait 07/12/2017   Myoclonic jerkings, 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/2017   Acquired right foot drop 07/12/2017   UTI (urinary tract infection) due to Enterococcus 07/12/2017   Pressure injury of skin 06/21/2017   Acute cystitis 06/20/2017   Generalized weakness 06/20/2017   Dehydration 06/20/2017   Dyspnea 12/26/2016   Chronic cough 12/26/2016   Hypersomnia with sleep apnea 10/30/2016   CSA (central sleep apnea) 10/30/2016   Wheezing symptom 10/30/2016   CPAP/BiPAP dependent 07/01/2015   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   RLS (restless legs syndrome) 08/25/2014   Primary gout 08/25/2014   OSA on CPAP 08/25/2014   Severe obesity (BMI >= 40) ([place]) 08/25/2014   Obesity (BMI 30-39.9) 02/18/2013   Sleep apnea with use of continuous positive airway pressure (CPAP)    Occlusion and stenosis of carotid artery without mention of cerebral infarction 11/29/2012    S/P TKR (total knee replacement) 11/29/2012   History of thyroid cancer     Jadasia Haws PT, DPT 02/13/2018, 8:14 AM  [facility] [address] [place], [place], [address] Phone: [phone]   Fax:  [phone]  Name: [name] MRN: [id number] Date of Birth: [birth year]

## 2018-02-13 NOTE — Patient Instructions (Signed)
Access Code: [email or web address]  URL: [email or web address]  Date: 02/13/2018  Prepared by: Willow Ora   Exercises  Supine Bridge - 10 reps - 1 sets - 5 hold - 1x daily - 5x weekly  Hooklying Single Leg Bent Knee Fallouts with Resistance - 10 reps - 1 sets - 1x daily - 5x weekly  Seated Chair Push Ups - 10 reps - 1 sets - 3-5 hold - 1x daily - 5x weekly  Seated Hamstring Curl with Anchored Resistance - 10 reps - 1 sets - 1x daily - 5x weekly  Seated Knee Extension with Resistance - 10 reps - 1 sets - 1x daily - 5x weekly

## 2018-02-14 NOTE — Therapy (Signed)
[facility] [address] [place], [place], [address] Phone: [phone]   Fax:  [phone]  Physical Therapy Treatment  Patient Details  Name: [name] MRN: [id number] Date of Birth: May 22, 1941 Referring Provider: Larey Seat, MD   Encounter Date: 02/13/2018  PT End of Session - 02/13/18 1021    Visit Number  2    Number of Visits  26    Date for PT Re-Evaluation  05/10/18    Authorization Type  [employer] Pt has met $5900 oop, full coverage,     PT Start Time  1018    PT Stop Time  1100    PT Time Calculation (min)  42 min    Equipment Utilized During Treatment  Gait belt    Activity Tolerance  Patient tolerated treatment well;Patient limited by fatigue;No increased pain    Behavior During Therapy  WFL for tasks assessed/performed       Past Medical History:  Diagnosis Date  . Arthritis   . Barrett esophagus   . Bronchitis   . CAD (coronary artery disease)    pt's wife denies any cardiac history on pt.  . Cancer ([facility])    thyroid  . Carotid artery occlusion   . Chronic kidney disease    denies  . Constipation   . COPD (chronic obstructive pulmonary disease) ([facility])   . Diabetes mellitus without complication ([facility])   . Dyspnea   . GERD (gastroesophageal reflux disease)   . Headache    migraines  . History of thyroid cancer 2008  . History of thyroid cancer   . Hypertension   . OSA (obstructive sleep apnea)   . Pneumonia   . Restless leg   . S/P tendon repair    left thumb  . Sleep apnea with use of continuous positive airway pressure (CPAP)    [facility] , AHI  77cn central and obstrcutive. 16 cm water , 3 cm EPR.   . Thoracic ascending aortic aneurysm (HCC)    4.2 cm ascending TAA 08/18/17 CTA. Annual imaging recommended.  . Thyroid disease   . Ulcer    Peptic ulcer disease    Past Surgical History:  Procedure Laterality Date  . ANTERIOR CERVICAL DECOMP/DISCECTOMY FUSION N/A  09/03/2017   Procedure: ACDF - C3-C4 - C4-C5 - C5-C6;  Surgeon: Kary Kos, MD;  Location: [facility];  Service: Neurosurgery;  Laterality: N/A;  . CAROTID ENDARTERECTOMY Left January 03, 2006   Dr. Amedeo Plenty  . COLONOSCOPY    . EYE SURGERY Bilateral    cataract surgery with lens implants  . game keepers thumb Right   . JOINT REPLACEMENT Left    knee X 2  . ROTATOR CUFF REPAIR Right   . THYROIDECTOMY  2008    There were no vitals filed for this visit.  Subjective Assessment - 02/13/18 1021    Subjective  No new complaints. No falls or pain to reportl     Patient is accompained by:  Family member   spouse [name]   Pertinent History  CAD, progressive muscular dystrophy, DM2, COPD, hypoxic respiratory failure, HTN,    Patient Stated Goals  To walk in home & community, wife wants him to be able to care for himself if something happens to her       Treatment: issued the following to pt's HEP today with cues needed on correct ex form and technique.  Transfers: Lateral scooting wheelchair<>mat table at level height with  min guard assist for safety. Min assist for sitting edge of mat to/from supine on mat table with cues on technique/sequencing.     Access Code: [email or web address]  URL: [email or web address]  Date: 02/13/2018  Prepared by: Willow Ora   Exercises  Supine Bridge - 10 reps - 1 sets - 5 hold - 1x daily - 5x weekly  Hooklying Single Leg Bent Knee Fallouts with Resistance - 10 reps - 1 sets - 1x daily - 5x weekly  Seated Chair Push Ups - 10 reps - 1 sets - 3-5 hold - 1x daily - 5x weekly  Seated Hamstring Curl with Anchored Resistance - 10 reps - 1 sets - 1x daily - 5x weekly  Seated Knee Extension with Resistance - 10 reps - 1 sets - 1x daily - 5x weekly               PT Education - 02/13/18 1730    Education Details  HEP issued today for stretching and strengthening    Person(s) Educated  Patient;Spouse    Methods  Explanation;Demonstration;Verbal cues;Tactile  cues;Handout    Comprehension  Verbalized understanding;Returned demonstration;Verbal cues required;Need further instruction;Tactile cues required       PT Short Term Goals - 02/13/18 0809      PT SHORT TERM GOAL #1   Title  Patient & wife demonstrate & verbalize understanding of initial HEP. (All STGs Target Date: 03/15/2018)    Time  1    Period  Months    Status  New    Target Date  03/15/18      PT SHORT TERM GOAL #2   Title  Patient performs scooting transfers right & left between level surfaces with minimal guard.    Time  1    Period  Months    Status  New    Target Date  03/15/18      PT SHORT TERM GOAL #3   Title  Patient sit to/from supine with patient 50% assist with LE movements.     Time  1    Period  Months    Status  New    Target Date  03/15/18      PT SHORT TERM GOAL #4   Title  Sit to/from stand w/c to sink support, static 2 minutes & scans right/left, up/down with minimal guard.     Time  1    Period  Months    Status  New    Target Date  03/15/18      PT SHORT TERM GOAL #5   Title  Patient ambulates 73' with RW with moderate assist.     Time  1    Period  Months    Status  New    Target Date  03/15/18        PT Long Term Goals - 02/13/18 0758      PT LONG TERM GOAL #1   Title  Patient & wife demonstrate & verbalize understanding of ongoing exercise / fitness plan including community programs. (All LTGs Target Date: 05/14/2018)    Time  3    Period  Months    Status  New    Target Date  05/14/18      PT LONG TERM GOAL #2   Title  w/c to bed (mat at similar ht to his bed) scooting or squat pivot transfer modified independent.    Time  3    Period  Months    Status  New  Target Date  05/14/18      PT LONG TERM GOAL #3   Title  Sit to/from supine modified independent.    Time  3    Period  Months    Status  New    Target Date  05/14/18      PT LONG TERM GOAL #4   Title  Sit to/from stand chairs with armrests to RW with supervision.     Time  3    Period  Months    Status  New    Target Date  05/14/18      PT LONG TERM GOAL #5   Title  Patient standing balance with RW with BUE support staticly 2 minutes, scans environment and reaches within length of arm for objects with supervision.     Time  3    Period  Months    Status  New    Target Date  05/14/18      Additional Long Term Goals   Additional Long Term Goals  Yes      PT LONG TERM GOAL #6   Title  Patient ambulates 18' with RW with minA.     Time  3    Period  Months    Status  New    Target Date  05/14/18            Plan - 02/13/18 1022    Clinical Impression Statement  Today's skilled session focused on establishment of an HEP for stretching and strengthening. No issues reported with performance in session. Pt is progressing toward goals and should benefit from continued PT to progress toward unmet goals.     Rehab Potential  Good    Clinical Impairments Affecting Rehab Potential  weakness & ROM impaired 4 extremities & trunk; dependency transfers, bed mobility, standing & gait    PT Frequency  2x / week    PT Duration  Other (comment)   13 weeks (90 days)   PT Treatment/Interventions  ADLs/Self Care Home Management;Canalith Repostioning;Moist Heat;DME Instruction;Gait training;Stair training;Functional mobility training;Therapeutic activities;Therapeutic exercise;Balance training;Neuromuscular re-education;Patient/family education;Manual techniques;Passive range of motion;Vestibular    PT Next Visit Plan  try standing at sink, if safe add to HEP. work on standing and gait in parallel bars with 2 person assistance    Consulted and Agree with Plan of Care  Patient;Family member/caregiver    Family Member Consulted  wife       Patient will benefit from skilled therapeutic intervention in order to improve the following deficits and impairments:  Abnormal gait, Cardiopulmonary status limiting activity, Decreased activity tolerance, Decreased balance,  Decreased endurance, Decreased knowledge of use of DME, Decreased mobility, Decreased range of motion, Decreased strength, Difficulty walking, Dizziness, Impaired flexibility, Impaired UE functional use, Postural dysfunction  Visit Diagnosis: Muscle weakness (generalized)  Contracture of muscle, multiple sites  Chronic midline low back pain without sciatica  Abnormal posture     Problem List Patient Active Problem List   Diagnosis Date Noted  . 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 Acute respiratory failure with hypoxemia ([facility]) 08/18/2017  . Hypokalemia 08/18/2017  . Leukocytosis 08/18/2017  . Muscle atrophy of lower extremity 08/09/2017  . Bowel and bladder incontinence 08/09/2017  . Right-sided muscle weakness 08/09/2017  . Neuropathy 08/09/2017  . 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/2017  . Abnormality of gait 07/12/2017  . Myoclonic jerkings, 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/2017  . Acquired right foot drop 07/12/2017  . UTI (urinary tract infection) due to Enterococcus 07/12/2017  .  Pressure injury of skin 06/21/2017  . Acute cystitis 06/20/2017  . Generalized weakness 06/20/2017  . Dehydration 06/20/2017  . Dyspnea 12/26/2016  . Chronic cough 12/26/2016  . Hypersomnia with sleep apnea 10/30/2016  . CSA (central sleep apnea) 10/30/2016  . Wheezing symptom 10/30/2016  . CPAP/BiPAP dependent 07/01/2015  . 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 RLS (restless legs syndrome) 08/25/2014  . Primary gout 08/25/2014  . OSA on CPAP 08/25/2014  . Severe obesity (BMI >= 40) ([facility]) 08/25/2014  . Obesity (BMI 30-39.9) 02/18/2013  . Sleep apnea with use of continuous positive airway pressure (CPAP)   . 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 S/P TKR (total knee replacement) 11/29/2012  . History of thyroid cancer     Willow Ora, PTA, [facility] [address] [place], [place]  [address] [phone] 02/14/18, 9:24 AM   Name: [name] MRN: [id number] Date of Birth: 1940-09-18

## 2018-02-20 ENCOUNTER — Ambulatory Visit: Payer: Medicare Other | Admitting: Physical Therapy

## 2018-02-26 ENCOUNTER — Encounter: Payer: Self-pay | Admitting: Physical Therapy

## 2018-02-26 ENCOUNTER — Ambulatory Visit: Payer: Medicare Other | Attending: Neurology | Admitting: Physical Therapy

## 2018-02-26 DIAGNOSIS — M545 Low back pain: Secondary | ICD-10-CM | POA: Diagnosis present

## 2018-02-26 DIAGNOSIS — M6249 Contracture of muscle, multiple sites: Secondary | ICD-10-CM | POA: Insufficient documentation

## 2018-02-26 DIAGNOSIS — R2681 Unsteadiness on feet: Secondary | ICD-10-CM | POA: Insufficient documentation

## 2018-02-26 DIAGNOSIS — Z9181 History of falling: Secondary | ICD-10-CM | POA: Insufficient documentation

## 2018-02-26 DIAGNOSIS — R293 Abnormal posture: Secondary | ICD-10-CM | POA: Diagnosis present

## 2018-02-26 DIAGNOSIS — G8929 Other chronic pain: Secondary | ICD-10-CM

## 2018-02-26 DIAGNOSIS — R2689 Other abnormalities of gait and mobility: Secondary | ICD-10-CM | POA: Insufficient documentation

## 2018-02-26 DIAGNOSIS — M6281 Muscle weakness (generalized): Secondary | ICD-10-CM | POA: Insufficient documentation

## 2018-02-27 NOTE — Therapy (Signed)
[facility] [address] [place], [place], [address] Phone: [phone]   Fax:  [phone]  Physical Therapy Treatment  Patient Details  Name: [name] MRN: [id number] Date of Birth: 1941-02-15 Referring Provider: Larey Seat, MD   Encounter Date: 02/26/2018  PT End of Session - 02/26/18 2209    Visit Number  3    Number of Visits  26    Date for PT Re-Evaluation  05/10/18    Authorization Type  [employer] Pt has met $5900 oop, full coverage,     PT Start Time  1318    PT Stop Time  1400    PT Time Calculation (min)  42 min    Equipment Utilized During Treatment  Gait belt    Activity Tolerance  Patient tolerated treatment well;Patient limited by fatigue    Behavior During Therapy  WFL for tasks assessed/performed       Past Medical History:  Diagnosis Date  . Arthritis   . Barrett esophagus   . Bronchitis   . CAD (coronary artery disease)    pt's wife denies any cardiac history on pt.  . Cancer ([facility])    thyroid  . Carotid artery occlusion   . Chronic kidney disease    denies  . Constipation   . COPD (chronic obstructive pulmonary disease) ([facility] [place])   . Diabetes mellitus without complication ([facility])   . Dyspnea   . GERD (gastroesophageal reflux disease)   . Headache    migraines  . History of thyroid cancer 2008  . History of thyroid cancer   . Hypertension   . OSA (obstructive sleep apnea)   . Pneumonia   . Restless leg   . S/P tendon repair    left thumb  . Sleep apnea with use of continuous positive airway pressure (CPAP)    [facility] , AHI  77cn central and obstrcutive. 16 cm water , 3 cm EPR.   . Thoracic ascending aortic aneurysm (HCC)    4.2 cm ascending TAA 08/18/17 CTA. Annual imaging recommended.  . Thyroid disease   . Ulcer    Peptic ulcer disease    Past Surgical History:  Procedure Laterality Date  . ANTERIOR CERVICAL DECOMP/DISCECTOMY FUSION N/A 09/03/2017   Procedure: ACDF - C3-C4 - C4-C5 - C5-C6;  Surgeon: Kary Kos, MD;  Location: [facility];  Service: Neurosurgery;  Laterality: N/A;  . CAROTID ENDARTERECTOMY Left January 03, 2006   Dr. Amedeo Plenty  . COLONOSCOPY    . EYE SURGERY Bilateral    cataract surgery with lens implants  . game keepers thumb Right   . JOINT REPLACEMENT Left    knee X 2  . ROTATOR CUFF REPAIR Right   . THYROIDECTOMY  2008    There were no vitals filed for this visit.      02/26/18 1319  Symptoms/Limitations  Subjective No new complaitns. No falls. Does report some left shoulder pain. Reports last week his energy was "aweful", however seems to be improving since Sunday. Spouse is attributing this to emotional stress from brother passing away.   Patient is accompained by: Family member (spouse [name])  Pertinent History CAD, progressive muscular dystrophy, DM2, COPD, hypoxic respiratory failure, HTN,  Patient Stated Goals To walk in home & community, wife wants him to be able to care for himself if something happens to her  Pain Assessment  Currently in Pain? Yes  Pain Score 6  Pain Location Shoulder  Pain Orientation  Left  Pain Descriptors / Indicators Aching;Sharp  Pain Type Chronic pain  Pain Onset More than a month ago  Pain Frequency Constant  Aggravating Factors  certain positions, immobility  Pain Relieving Factors repositioning arm, Tylenol      02/26/18 2220  Transfers  Transfers Sit to Stand;Stand to Sit;Lateral/Scoot Transfers  Sit to Stand 3: Mod assist;With upper extremity assist;With armrests;From chair/3-in-1  Sit to Stand Details Verbal cues for sequencing;Verbal cues for technique;Tactile cues for weight shifting;Manual facilitation for weight shifting;Manual facilitation for placement;Manual facilitation for weight bearing  Stand to Sit 4: Min assist;With upper extremity assist;With armrests;To chair/3-in-1  Stand to Sit Details (indicate cue type and reason) Verbal cues for sequencing;Verbal cues  for technique;Manual facilitation for weight shifting  Comments x4 reps at sink. working on upright standing, lateral weight shifting, progressing to lifting one hand at a time. max standing time was 1 minute 30 sec's; progressed to standing in parallel bars for 4 reps. progressed to gait with all 4 reps.                              Ambulation/Gait  Ambulation/Gait Yes  Ambulation/Gait Assistance 2: Max assist;3: Mod assist (2 people for safety)  Ambulation/Gait Assistance Details PTA seated in front of pt with second PTA assist from side of bars on right side. spouse followed with chair. bil knees blocked, manual facilitation for weight shifitng and posture. peformed 4 laps in parallel bars.                              Ambulation Distance (Feet)  (6-8 feet)  Assistive device Parallel bars  Gait Pattern Step-to pattern;Decreased step length - right;Decreased step length - left;Decreased stride length;Decreased hip/knee flexion - right;Decreased hip/knee flexion - left;Decreased dorsiflexion - left;Decreased dorsiflexion - right;Shuffle;Trunk flexed;Narrow base of support  Ambulation Surface Level;Indoor        PT Short Term Goals - 02/13/18 0809      PT SHORT TERM GOAL #1   Title  Patient & wife demonstrate & verbalize understanding of initial HEP. (All STGs Target Date: 03/15/2018)    Time  1    Period  Months    Status  New    Target Date  03/15/18      PT SHORT TERM GOAL #2   Title  Patient performs scooting transfers right & left between level surfaces with minimal guard.    Time  1    Period  Months    Status  New    Target Date  03/15/18      PT SHORT TERM GOAL #3   Title  Patient sit to/from supine with patient 50% assist with LE movements.     Time  1    Period  Months    Status  New    Target Date  03/15/18      PT SHORT TERM GOAL #4   Title  Sit to/from stand w/c to sink support, static 2 minutes & scans right/left, up/down with minimal guard.     Time  1     Period  Months    Status  New    Target Date  03/15/18      PT SHORT TERM GOAL #5   Title  Patient ambulates 46' with RW with moderate assist.     Time  1    Period  Months  Status  New    Target Date  03/15/18        PT Long Term Goals - 02/13/18 0758      PT LONG TERM GOAL #1   Title  Patient & wife demonstrate & verbalize understanding of ongoing exercise / fitness plan including community programs. (All LTGs Target Date: 05/14/2018)    Time  3    Period  Months    Status  New    Target Date  05/14/18      PT LONG TERM GOAL #2   Title  w/c to bed (mat at similar ht to his bed) scooting or squat pivot transfer modified independent.    Time  3    Period  Months    Status  New    Target Date  05/14/18      PT LONG TERM GOAL #3   Title  Sit to/from supine modified independent.    Time  3    Period  Months    Status  New    Target Date  05/14/18      PT LONG TERM GOAL #4   Title  Sit to/from stand chairs with armrests to RW with supervision.    Time  3    Period  Months    Status  New    Target Date  05/14/18      PT LONG TERM GOAL #5   Title  Patient standing balance with RW with BUE support staticly 2 minutes, scans environment and reaches within length of arm for objects with supervision.     Time  3    Period  Months    Status  New    Target Date  05/14/18      Additional Long Term Goals   Additional Long Term Goals  Yes      PT LONG TERM GOAL #6   Title  Patient ambulates 81' with RW with minA.     Time  3    Period  Months    Status  New    Target Date  05/14/18            Plan - 02/26/18 2210    Clinical Impression Statement  Today's skilled session initially focused on sit<>stand transfer at the sink. Pt needed mod/max assist to achieve standing, then up to min assist to maintain standing. Session progressed to standing in the parallel bars and gait with 2 person assistance. The pt is progressing toward goals and should benefit from  continued PT to progress toward unmet goals.              Rehab Potential  Good    Clinical Impairments Affecting Rehab Potential  weakness & ROM impaired 4 extremities & trunk; dependency transfers, bed mobility, standing & gait    PT Frequency  2x / week    PT Duration  Other (comment)   13 weeks (90 days)   PT Treatment/Interventions  ADLs/Self Care Home Management;Canalith Repostioning;Moist Heat;DME Instruction;Gait training;Stair training;Functional mobility training;Therapeutic activities;Therapeutic exercise;Balance training;Neuromuscular re-education;Patient/family education;Manual techniques;Passive range of motion;Vestibular    PT Next Visit Plan  continue to work on standing at sink/in parallel bars, gait in parallel bars with 2 person assist    Consulted and Agree with Plan of Care  Patient;Family member/caregiver    Family Member Consulted  wife       Patient will benefit from skilled therapeutic intervention in order to improve the following deficits and impairments:  Abnormal gait,  Cardiopulmonary status limiting activity, Decreased activity tolerance, Decreased balance, Decreased endurance, Decreased knowledge of use of DME, Decreased mobility, Decreased range of motion, Decreased strength, Difficulty walking, Dizziness, Impaired flexibility, Impaired UE functional use, Postural dysfunction  Visit Diagnosis: Muscle weakness (generalized)  Contracture of muscle, multiple sites  Chronic midline low back pain without sciatica  Abnormal posture  Other abnormalities of gait and mobility     Problem List Patient Active Problem List   Diagnosis Date Noted  . 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 Acute respiratory failure with hypoxemia ([facility] [place]) 08/18/2017  . Hypokalemia 08/18/2017  . Leukocytosis 08/18/2017  . Muscle atrophy of lower extremity 08/09/2017  . Bowel and bladder incontinence 08/09/2017  . Right-sided muscle weakness 08/09/2017  . Neuropathy 08/09/2017  .  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/2017  . Abnormality of gait 07/12/2017  . Myoclonic jerkings, 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/2017  . Acquired right foot drop 07/12/2017  . UTI (urinary tract infection) due to Enterococcus 07/12/2017  . Pressure injury of skin 06/21/2017  . Acute cystitis 06/20/2017  . Generalized weakness 06/20/2017  . Dehydration 06/20/2017  . Dyspnea 12/26/2016  . Chronic cough 12/26/2016  . Hypersomnia with sleep apnea 10/30/2016  . CSA (central sleep apnea) 10/30/2016  . Wheezing symptom 10/30/2016  . CPAP/BiPAP dependent 07/01/2015  . 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 RLS (restless legs syndrome) 08/25/2014  . Primary gout 08/25/2014  . OSA on CPAP 08/25/2014  . Severe obesity (BMI >= 40) ([facility]) 08/25/2014  . Obesity (BMI 30-39.9) 02/18/2013  . Sleep apnea with use of continuous positive airway pressure (CPAP)   . 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 S/P TKR (total knee replacement) 11/29/2012  . History of thyroid cancer     Willow Ora, PTA, [facility] [address] [place], [place] [address] [phone] 02/27/18, 10:18 PM   Name: [name] MRN: [id number] Date of Birth: 1940/07/19

## 2018-03-01 ENCOUNTER — Ambulatory Visit: Payer: Medicare Other | Admitting: Physical Therapy

## 2018-03-01 ENCOUNTER — Encounter: Payer: Self-pay | Admitting: Physical Therapy

## 2018-03-01 DIAGNOSIS — G8929 Other chronic pain: Secondary | ICD-10-CM

## 2018-03-01 DIAGNOSIS — R2681 Unsteadiness on feet: Secondary | ICD-10-CM

## 2018-03-01 DIAGNOSIS — R293 Abnormal posture: Secondary | ICD-10-CM

## 2018-03-01 DIAGNOSIS — M6281 Muscle weakness (generalized): Secondary | ICD-10-CM

## 2018-03-01 DIAGNOSIS — M545 Low back pain: Secondary | ICD-10-CM

## 2018-03-01 DIAGNOSIS — M6249 Contracture of muscle, multiple sites: Secondary | ICD-10-CM

## 2018-03-01 NOTE — Therapy (Signed)
[facility] [address] [place], [place], [address] Phone: [phone]   Fax:  [phone]  Physical Therapy Treatment  Patient Details  Name: [name] MRN: [id number] Date of Birth: 1941/03/08 Referring Provider: Larey Seat, MD   Encounter Date: 03/01/2018  PT End of Session - 03/01/18 1153    Visit Number  4    Number of Visits  26    Date for PT Re-Evaluation  05/10/18    Authorization Type  [employer] Pt has met $5900 oop, full coverage,     PT Start Time  9381    PT Stop Time  1228    PT Time Calculation (min)  40 min    Equipment Utilized During Treatment  Gait belt    Activity Tolerance  Patient tolerated treatment well;Patient limited by fatigue;No increased pain    Behavior During Therapy  WFL for tasks assessed/performed       Past Medical History:  Diagnosis Date  . Arthritis   . Barrett esophagus   . Bronchitis   . CAD (coronary artery disease)    pt's wife denies any cardiac history on pt.  . Cancer ([place])    thyroid  . Carotid artery occlusion   . Chronic kidney disease    denies  . Constipation   . COPD (chronic obstructive pulmonary disease) ([place])   . Diabetes mellitus without complication ([place])   . Dyspnea   . GERD (gastroesophageal reflux disease)   . Headache    migraines  . History of thyroid cancer 2008  . History of thyroid cancer   . Hypertension   . OSA (obstructive sleep apnea)   . Pneumonia   . Restless leg   . S/P tendon repair    left thumb  . Sleep apnea with use of continuous positive airway pressure (CPAP)    2011 [facility] , AHI  77cn central and obstrcutive. 16 cm water , 3 cm EPR.   . Thoracic ascending aortic aneurysm (HCC)    4.2 cm ascending TAA 08/18/17 CTA. Annual imaging recommended.  . Thyroid disease   . Ulcer    Peptic ulcer disease    Past Surgical History:  Procedure Laterality Date  . ANTERIOR CERVICAL DECOMP/DISCECTOMY FUSION N/A  09/03/2017   Procedure: ACDF - C3-C4 - C4-C5 - C5-C6;  Surgeon: Kary Kos, MD;  Location: [facility];  Service: Neurosurgery;  Laterality: N/A;  . CAROTID ENDARTERECTOMY Left January 03, 2006   Dr. Amedeo Plenty  . COLONOSCOPY    . EYE SURGERY Bilateral    cataract surgery with lens implants  . game keepers thumb Right   . JOINT REPLACEMENT Left    knee X 2  . ROTATOR CUFF REPAIR Right   . THYROIDECTOMY  2008    There were no vitals filed for this visit.  Subjective Assessment - 03/01/18 1152    Subjective  No new complaints. Was not sore or too tired after last session. Has not tried standing at sink at home with caregiver since last session either.     Patient is accompained by:  Family member   spouse, [name]   Pertinent History  CAD, progressive muscular dystrophy, DM2, COPD, hypoxic respiratory failure, HTN,    Patient Stated Goals  To walk in home & community, wife wants him to be able to care for himself if something happens to her    Currently in Pain?  Yes    Pain Score  4  Pain Location  Shoulder    Pain Orientation  Left    Pain Descriptors / Indicators  Aching;Sharp    Pain Type  Chronic pain    Pain Onset  More than a month ago    Pain Frequency  Constant    Aggravating Factors   certain positions, immobility    Pain Relieving Factors  repositioning arm, Tylenol           [facility] Adult PT Treatment/Exercise - 03/01/18 1206      Transfers   Transfers  Sit to Stand;Stand to Sit;Squat Pivot Transfers    Sit to Stand  4: Min assist;3: Mod assist;With upper extremity assist;From chair/3-in-1    Sit to Stand Details  Verbal cues for sequencing;Verbal cues for technique;Tactile cues for weight shifting;Manual facilitation for weight shifting;Manual facilitation for placement;Manual facilitation for weight bearing    Sit to Stand Details (indicate cue type and reason)  mod assist with 1st stand, min assist with 2cd stand. cues each time to scoot to edge of seat and for ant weight  shifting     Stand to Sit  4: Min assist;With upper extremity assist;To chair/3-in-1    Stand to Sit Details (indicate cue type and reason)  Verbal cues for sequencing;Verbal cues for technique;Manual facilitation for weight shifting    Stand to Sit Details  cues to use UE's for slow controlled descent.     Squat Pivot Transfers  2: [employer] risk analyst Details (indicate cue type and reason)  wheelchair to Nustep with cues on technique    Lateral/Scoot Transfers  4: Min guard;4: Min assist    Lateral/Scoot Transfer Details (indicate cue type and reason)  Nustep to wheelchair (level surfaces) with cues on technique.     Comments  x2 reps at sink: 1st stand for 3 minutes, 50 seconds, 2cd stand for 4 mintues. working on upright posture, lateral weight shifitng, knee/hip extension with each stand. min guard to min assist for standing balance with each stand.       Knee/Hip Exercises: Aerobic   Nustep  level 2 with UE'LEs's x 8 mintues with goal >/= 50 steps per minute for strengthening/activity tolerance           PT Short Term Goals - 02/13/18 0809      PT SHORT TERM GOAL #1   Title  Patient & wife demonstrate & verbalize understanding of initial HEP. (All STGs Target Date: 03/15/2018)    Time  1    Period  Months    Status  New    Target Date  03/15/18      PT SHORT TERM GOAL #2   Title  Patient performs scooting transfers right & left between level surfaces with minimal guard.    Time  1    Period  Months    Status  New    Target Date  03/15/18      PT SHORT TERM GOAL #3   Title  Patient sit to/from supine with patient 50% assist with LE movements.     Time  1    Period  Months    Status  New    Target Date  03/15/18      PT SHORT TERM GOAL #4   Title  Sit to/from stand w/c to sink support, static 2 minutes & scans right/left, up/down with minimal guard.     Time  1    Period  Months    Status  New    Target Date  03/15/18      PT SHORT TERM GOAL #5    Title  Patient ambulates 25' with RW with moderate assist.     Time  1    Period  Months    Status  New    Target Date  03/15/18        PT Long Term Goals - 02/13/18 0758      PT LONG TERM GOAL #1   Title  Patient & wife demonstrate & verbalize understanding of ongoing exercise / fitness plan including community programs. (All LTGs Target Date: 05/14/2018)    Time  3    Period  Months    Status  New    Target Date  05/14/18      PT LONG TERM GOAL #2   Title  w/c to bed (mat at similar ht to his bed) scooting or squat pivot transfer modified independent.    Time  3    Period  Months    Status  New    Target Date  05/14/18      PT LONG TERM GOAL #3   Title  Sit to/from supine modified independent.    Time  3    Period  Months    Status  New    Target Date  05/14/18      PT LONG TERM GOAL #4   Title  Sit to/from stand chairs with armrests to RW with supervision.    Time  3    Period  Months    Status  New    Target Date  05/14/18      PT LONG TERM GOAL #5   Title  Patient standing balance with RW with BUE support staticly 2 minutes, scans environment and reaches within length of arm for objects with supervision.     Time  3    Period  Months    Status  New    Target Date  05/14/18      Additional Long Term Goals   Additional Long Term Goals  Yes      PT LONG TERM GOAL #6   Title  Patient ambulates 30' with RW with minA.     Time  3    Period  Months    Status  New    Target Date  05/14/18            Plan - 03/01/18 1153    Clinical Impression Statement  Today's skilled session continued to focus on strengthening, transfers and standing. He as able to increase is consecutive and overall standing times today before needing to sit to rest. The pt is progressing toward goals and should benefit from continued PT to progress toward unmet goals.     Rehab Potential  Good    Clinical Impairments Affecting Rehab Potential  weakness & ROM impaired 4 extremities &  trunk; dependency transfers, bed mobility, standing & gait    PT Frequency  2x / week    PT Duration  Other (comment)   13 weeks (90 days)   PT Treatment/Interventions  ADLs/Self Care Home Management;Canalith Repostioning;Moist Heat;DME Instruction;Gait training;Stair training;Functional mobility training;Therapeutic activities;Therapeutic exercise;Balance training;Neuromuscular re-education;Patient/family education;Manual techniques;Passive range of motion;Vestibular    PT Next Visit Plan  continue to work on standing at sink/in parallel bars, gait in parallel bars with 2 person assist, LE stretching/strengthening    Consulted and Agree with Plan of Care  Patient;Family member/caregiver    Family  Member Consulted  wife       Patient will benefit from skilled therapeutic intervention in order to improve the following deficits and impairments:  Abnormal gait, Cardiopulmonary status limiting activity, Decreased activity tolerance, Decreased balance, Decreased endurance, Decreased knowledge of use of DME, Decreased mobility, Decreased range of motion, Decreased strength, Difficulty walking, Dizziness, Impaired flexibility, Impaired UE functional use, Postural dysfunction  Visit Diagnosis: Muscle weakness (generalized)  Contracture of muscle, multiple sites  Chronic midline low back pain without sciatica  Abnormal posture  Unsteadiness on feet     Problem List Patient Active Problem List   Diagnosis Date Noted  . 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 Acute respiratory failure with hypoxemia ([place]) 08/18/2017  . Hypokalemia 08/18/2017  . Leukocytosis 08/18/2017  . Muscle atrophy of lower extremity 08/09/2017  . Bowel and bladder incontinence 08/09/2017  . Right-sided muscle weakness 08/09/2017  . Neuropathy 08/09/2017  . 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/2017  . Abnormality of gait 07/12/2017  . Myoclonic jerkings, 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/2017  . Acquired right foot drop  07/12/2017  . UTI (urinary tract infection) due to Enterococcus 07/12/2017  . Pressure injury of skin 06/21/2017  . Acute cystitis 06/20/2017  . Generalized weakness 06/20/2017  . Dehydration 06/20/2017  . Dyspnea 12/26/2016  . Chronic cough 12/26/2016  . Hypersomnia with sleep apnea 10/30/2016  . CSA (central sleep apnea) 10/30/2016  . Wheezing symptom 10/30/2016  . CPAP/BiPAP dependent 07/01/2015  . 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 RLS (restless legs syndrome) 08/25/2014  . Primary gout 08/25/2014  . OSA on CPAP 08/25/2014  . Severe obesity (BMI >= 40) ([place]) 08/25/2014  . Obesity (BMI 30-39.9) 02/18/2013  . Sleep apnea with use of continuous positive airway pressure (CPAP)   . 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 S/P TKR (total knee replacement) 11/29/2012  . History of thyroid cancer     Willow Ora, PTA, [facility] [address], [place], [place] [address] [phone] 03/01/18, 2:35 PM   Name: [name] MRN: [id number] Date of Birth: [birth year]

## 2018-03-04 ENCOUNTER — Encounter: Payer: Self-pay | Admitting: Physical Therapy

## 2018-03-04 ENCOUNTER — Ambulatory Visit: Payer: Medicare Other | Admitting: Physical Therapy

## 2018-03-04 DIAGNOSIS — R293 Abnormal posture: Secondary | ICD-10-CM

## 2018-03-04 DIAGNOSIS — M6249 Contracture of muscle, multiple sites: Secondary | ICD-10-CM

## 2018-03-04 DIAGNOSIS — R2689 Other abnormalities of gait and mobility: Secondary | ICD-10-CM

## 2018-03-04 DIAGNOSIS — M6281 Muscle weakness (generalized): Secondary | ICD-10-CM | POA: Diagnosis not present

## 2018-03-04 DIAGNOSIS — G8929 Other chronic pain: Secondary | ICD-10-CM

## 2018-03-04 DIAGNOSIS — Z9181 History of falling: Secondary | ICD-10-CM

## 2018-03-04 DIAGNOSIS — M545 Low back pain: Secondary | ICD-10-CM

## 2018-03-04 DIAGNOSIS — R2681 Unsteadiness on feet: Secondary | ICD-10-CM

## 2018-03-04 NOTE — Therapy (Signed)
[facility] [address] [place], [place], [address] Phone: [phone]   Fax:  [phone]  Physical Therapy Treatment  Patient Details  Name: [name] MRN: [id number] Date of Birth: 1940/11/19 Referring Provider: Larey Seat, MD   Encounter Date: 03/04/2018  PT End of Session - 03/04/18 1305    Visit Number  5    Number of Visits  26    Date for PT Re-Evaluation  05/10/18    Authorization Type  [employer] Pt has met $5900 oop, full coverage,     PT Start Time  1154    PT Stop Time  1235    PT Time Calculation (min)  41 min    Activity Tolerance  Patient tolerated treatment well;Patient limited by fatigue;No increased pain    Behavior During Therapy  WFL for tasks assessed/performed       Past Medical History:  Diagnosis Date  . Arthritis   . Barrett esophagus   . Bronchitis   . CAD (coronary artery disease)    pt's wife denies any cardiac history on pt.  . Cancer ([place])    thyroid  . Carotid artery occlusion   . Chronic kidney disease    denies  . Constipation   . COPD (chronic obstructive pulmonary disease) ([place])   . Diabetes mellitus without complication ([place])   . Dyspnea   . GERD (gastroesophageal reflux disease)   . Headache    migraines  . History of thyroid cancer 2008  . History of thyroid cancer   . Hypertension   . OSA (obstructive sleep apnea)   . Pneumonia   . Restless leg   . S/P tendon repair    left thumb  . Sleep apnea with use of continuous positive airway pressure (CPAP)    [facility] , AHI  77cn central and obstrcutive. 16 cm water , 3 cm EPR.   . Thoracic ascending aortic aneurysm (HCC)    4.2 cm ascending TAA 08/18/17 CTA. Annual imaging recommended.  . Thyroid disease   . Ulcer    Peptic ulcer disease    Past Surgical History:  Procedure Laterality Date  . ANTERIOR CERVICAL DECOMP/DISCECTOMY FUSION N/A 09/03/2017   Procedure: ACDF - C3-C4 - C4-C5 -  C5-C6;  Surgeon: Kary Kos, MD;  Location: [facility];  Service: Neurosurgery;  Laterality: N/A;  . CAROTID ENDARTERECTOMY Left January 03, 2006   Dr. Amedeo Plenty  . COLONOSCOPY    . EYE SURGERY Bilateral    cataract surgery with lens implants  . game keepers thumb Right   . JOINT REPLACEMENT Left    knee X 2  . ROTATOR CUFF REPAIR Right   . THYROIDECTOMY  2008    There were no vitals filed for this visit.  Subjective Assessment - 03/04/18 1201    Subjective  Did not get to try standing at the sink this weekend with caregiver.  Is doing better with getting into/out of the car, wife only has to assist with pivoting his hips.  [name] is in two days!    Patient is accompained by:  Family member   spouse, [name]   Pertinent History  CAD, progressive muscular dystrophy, DM2, COPD, hypoxic respiratory failure, HTN,    Patient Stated Goals  To walk in home & community, wife wants him to be able to care for himself if something happens to her    Currently in Pain?  No/denies    Pain Onset  More  than a month ago                       [facility] Adult PT Treatment/Exercise - 03/04/18 1242      Transfers   Transfers  Stand to Sit;Sit to Stand    Sit to Stand  4: Min assist;3: Mod assist    Sit to Stand Details (indicate cue type and reason)  from transport w/c with UE support on // bars with verbal cues for more upright gaze and posture to facilitate increased weight shift of COG over BOS    Stand to Sit  4: Min assist;3: Mod assist;With upper extremity assist    Comments  performed x 4 reps at // bars while performing LLE forwards, backwards, lateral stepping to facilitate increased weight shift and WB through RLE.  9lb weight added to R ankle for increased proprioceptive input and to decrease reflex hip/knee flexion in standing.  Also cued pt to press R heel into floor during weight shift to improve stability when advancing LLE.        Therapeutic Activites    Therapeutic Activities  Other  Therapeutic Activities    Other Therapeutic Activities  educated pt to continue static stretch with weight on R knee adding in isometric glute and quad activation x 6 seconds x 10 reps; also educated to discontinue knee flexion in sitting      Exercises   Exercises  Knee/Hip      Knee/Hip Exercises: Standing   Terminal Knee Extension  Strengthening;Right;AROM;1 set;Theraband    Theraband Level (Terminal Knee Extension)  Level 3 (Green)    Terminal Knee Extension Limitations  3-4 reps before pt fatigued             PT Education - 03/04/18 1305    Education Details  adjusted HEP    Person(s) Educated  Patient;Spouse    Methods  Explanation;Demonstration    Comprehension  Verbalized understanding;Returned demonstration       PT Short Term Goals - 02/13/18 0809      PT SHORT TERM GOAL #1   Title  Patient & wife demonstrate & verbalize understanding of initial HEP. (All STGs Target Date: 03/15/2018)    Time  1    Period  Months    Status  New    Target Date  03/15/18      PT SHORT TERM GOAL #2   Title  Patient performs scooting transfers right & left between level surfaces with minimal guard.    Time  1    Period  Months    Status  New    Target Date  03/15/18      PT SHORT TERM GOAL #3   Title  Patient sit to/from supine with patient 50% assist with LE movements.     Time  1    Period  Months    Status  New    Target Date  03/15/18      PT SHORT TERM GOAL #4   Title  Sit to/from stand w/c to sink support, static 2 minutes & scans right/left, up/down with minimal guard.     Time  1    Period  Months    Status  New    Target Date  03/15/18      PT SHORT TERM GOAL #5   Title  Patient ambulates 14' with RW with moderate assist.     Time  1    Period  Months  Status  New    Target Date  03/15/18        PT Long Term Goals - 02/13/18 0758      PT LONG TERM GOAL #1   Title  Patient & wife demonstrate & verbalize understanding of ongoing exercise / fitness  plan including community programs. (All LTGs Target Date: 05/14/2018)    Time  3    Period  Months    Status  New    Target Date  05/14/18      PT LONG TERM GOAL #2   Title  w/c to bed (mat at similar ht to his bed) scooting or squat pivot transfer modified independent.    Time  3    Period  Months    Status  New    Target Date  05/14/18      PT LONG TERM GOAL #3   Title  Sit to/from supine modified independent.    Time  3    Period  Months    Status  New    Target Date  05/14/18      PT LONG TERM GOAL #4   Title  Sit to/from stand chairs with armrests to RW with supervision.    Time  3    Period  Months    Status  New    Target Date  05/14/18      PT LONG TERM GOAL #5   Title  Patient standing balance with RW with BUE support staticly 2 minutes, scans environment and reaches within length of arm for objects with supervision.     Time  3    Period  Months    Status  New    Target Date  05/14/18      Additional Long Term Goals   Additional Long Term Goals  Yes      PT LONG TERM GOAL #6   Title  Patient ambulates 33' with RW with minA.     Time  3    Period  Months    Status  New    Target Date  05/14/18            Plan - 03/04/18 1305    Clinical Impression Statement  Continued to focus on functional WB and RLE extension activation during sit <> stand, weight shifting in stading and WB during LLE advancement.  Pt tolerated well but did require frequent seated rest breaks and mod A from therapist to maintain standing balance and to facilitate full weight shift and RLE activation.  Adjusted HEP to continue to focus on extensor mm activation.  Will continue to progress towards LTG.    Rehab Potential  Good    Clinical Impairments Affecting Rehab Potential  weakness & ROM impaired 4 extremities & trunk; dependency transfers, bed mobility, standing & gait    PT Frequency  2x / week    PT Duration  Other (comment)   13 weeks (90 days)   PT Treatment/Interventions   ADLs/Self Care Home Management;Canalith Repostioning;Moist Heat;DME Instruction;Gait training;Stair training;Functional mobility training;Therapeutic activities;Therapeutic exercise;Balance training;Neuromuscular re-education;Patient/family education;Manual techniques;Passive range of motion;Vestibular    PT Next Visit Plan  Warm up on Nustep with LE only. continue to work on standing at sink/in parallel bars - weight shifting to RLE, extension through RLE (terminal knee extension) while advancing LLE in different directions, gait in parallel bars with 2 person assist, LE stretching/strengthening - terminal hip and knee extension    PT Home Exercise Plan  Access Code:  [email or web address]     Consulted and Agree with Plan of Care  Patient;Family member/caregiver    Family Member Consulted  wife       Patient will benefit from skilled therapeutic intervention in order to improve the following deficits and impairments:  Abnormal gait, Cardiopulmonary status limiting activity, Decreased activity tolerance, Decreased balance, Decreased endurance, Decreased knowledge of use of DME, Decreased mobility, Decreased range of motion, Decreased strength, Difficulty walking, Dizziness, Impaired flexibility, Impaired UE functional use, Postural dysfunction  Visit Diagnosis: Muscle weakness (generalized)  Abnormal posture  Unsteadiness on feet  Other abnormalities of gait and mobility  History of falling  Contracture of muscle, multiple sites  Chronic midline low back pain without sciatica     Problem List Patient Active Problem List   Diagnosis Date Noted  . 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 Acute respiratory failure with hypoxemia ([place]) 08/18/2017  . Hypokalemia 08/18/2017  . Leukocytosis 08/18/2017  . Muscle atrophy of lower extremity 08/09/2017  . Bowel and bladder incontinence 08/09/2017  . Right-sided muscle weakness 08/09/2017  . Neuropathy 08/09/2017  . Neurogenic claudication due to lumbar spinal  stenosis 08/09/2017  . Abnormality of gait 07/12/2017  . Myoclonic jerkings, 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/2017  . Acquired right foot drop 07/12/2017  . UTI (urinary tract infection) due to Enterococcus 07/12/2017  . Pressure injury of skin 06/21/2017  . Acute cystitis 06/20/2017  . Generalized weakness 06/20/2017  . Dehydration 06/20/2017  . Dyspnea 12/26/2016  . Chronic cough 12/26/2016  . Hypersomnia with sleep apnea 10/30/2016  . CSA (central sleep apnea) 10/30/2016  . Wheezing symptom 10/30/2016  . CPAP/BiPAP dependent 07/01/2015  . 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 RLS (restless legs syndrome) 08/25/2014  . Primary gout 08/25/2014  . OSA on CPAP 08/25/2014  . Severe obesity (BMI >= 40) ([place]) 08/25/2014  . Obesity (BMI 30-39.9) 02/18/2013  . Sleep apnea with use of continuous positive airway pressure (CPAP)   . 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 S/P TKR (total knee replacement) 11/29/2012  . History of thyroid cancer     Rico Junker, PT, DPT 03/04/18    5:51 PM     [facility] [address] [place], [place], [address] Phone: [phone]   Fax:  [phone]  Name: [name] MRN: [id number] Date of Birth: 02/27/1941

## 2018-03-04 NOTE — Patient Instructions (Signed)
Discontinued seated knee flexion; added isometric quad and glute activation during seated hamstring stretch  Access Code: [email or web address]  URL: [email or web address]  Date: 03/04/2018  Prepared by: Misty Stanley   Exercises  . Supine Bridge - 10 reps - 1 sets - 5 hold - 1x daily - 5x weekly  . Hooklying Single Leg Bent Knee Fallouts with Resistance - 10 reps - 1 sets - 1x daily - 5x weekly  . Seated Chair Push Ups - 10 reps - 1 sets - 3-5 hold - 1x daily - 5x weekly  . Seated Knee Extension with Resistance - 10 reps - 1 sets - 1x daily - 5x weekly  . Seated Hamstring Stretch with Chair - 10 reps - 6 second hold - 1x daily - 5x weekly

## 2018-03-06 ENCOUNTER — Ambulatory Visit: Payer: Medicare Other | Admitting: Physical Therapy

## 2018-03-06 ENCOUNTER — Encounter: Payer: Self-pay | Admitting: Physical Therapy

## 2018-03-06 DIAGNOSIS — R2689 Other abnormalities of gait and mobility: Secondary | ICD-10-CM

## 2018-03-06 DIAGNOSIS — M6281 Muscle weakness (generalized): Secondary | ICD-10-CM | POA: Diagnosis not present

## 2018-03-06 DIAGNOSIS — R293 Abnormal posture: Secondary | ICD-10-CM

## 2018-03-06 DIAGNOSIS — M6249 Contracture of muscle, multiple sites: Secondary | ICD-10-CM

## 2018-03-06 NOTE — Therapy (Signed)
[facility] [address] [place], [place], [address] Phone: [phone]   Fax:  [phone]  Physical Therapy Treatment  Patient Details  Name: [name] MRN: [id number] Date of Birth: 1940/09/08 Referring Provider: Larey Seat, MD   Encounter Date: 03/06/2018  PT End of Session - 03/06/18 1025    Visit Number  6    Number of Visits  26    Date for PT Re-Evaluation  05/10/18    Authorization Type  [employer] Pt has met $5900 oop, full coverage,     PT Start Time  1019    PT Stop Time  1100    PT Time Calculation (min)  41 min    Equipment Utilized During Treatment  Gait belt    Activity Tolerance  Patient tolerated treatment well;Patient limited by fatigue;No increased pain    Behavior During Therapy  WFL for tasks assessed/performed       Past Medical History:  Diagnosis Date  . Arthritis   . Barrett esophagus   . Bronchitis   . CAD (coronary artery disease)    pt's wife denies any cardiac history on pt.  . Cancer ([facility])    thyroid  . Carotid artery occlusion   . Chronic kidney disease    denies  . Constipation   . COPD (chronic obstructive pulmonary disease) ([place])   . Diabetes mellitus without complication ([facility])   . Dyspnea   . GERD (gastroesophageal reflux disease)   . Headache    migraines  . History of thyroid cancer 2008  . History of thyroid cancer   . Hypertension   . OSA (obstructive sleep apnea)   . Pneumonia   . Restless leg   . S/P tendon repair    left thumb  . Sleep apnea with use of continuous positive airway pressure (CPAP)    2011 [facility] , AHI  77cn central and obstrcutive. 16 cm water , 3 cm EPR.   . Thoracic ascending aortic aneurysm (HCC)    4.2 cm ascending TAA 08/18/17 CTA. Annual imaging recommended.  . Thyroid disease   . Ulcer    Peptic ulcer disease    Past Surgical History:  Procedure Laterality Date  . ANTERIOR CERVICAL DECOMP/DISCECTOMY FUSION N/A  09/03/2017   Procedure: ACDF - C3-C4 - C4-C5 - C5-C6;  Surgeon: Kary Kos, MD;  Location: [facility];  Service: Neurosurgery;  Laterality: N/A;  . CAROTID ENDARTERECTOMY Left January 03, 2006   Dr. Amedeo Plenty  . COLONOSCOPY    . EYE SURGERY Bilateral    cataract surgery with lens implants  . game keepers thumb Right   . JOINT REPLACEMENT Left    knee X 2  . ROTATOR CUFF REPAIR Right   . THYROIDECTOMY  2008    There were no vitals filed for this visit.  Subjective Assessment - 03/06/18 1024    Subjective  has had a busy week - wife reporting he has been doing well - good compliance with HEP - stretching and quad sets; reports R leg has been lying "flatter" since PT and stretching    Patient is accompained by:  Family member   wife   Pertinent History  CAD, progressive muscular dystrophy, DM2, COPD, hypoxic respiratory failure, HTN,    Patient Stated Goals  To walk in home & community, wife wants him to be able to care for himself if something happens to her    Currently in Pain?  No/denies  Pain Score  0-No pain                       [facility] Adult PT Treatment/Exercise - 03/06/18 0001      Transfers   Transfers  Stand to Sit;Sit to Stand    Sit to Stand  4: Min assist;3: Mod assist    Sit to Stand Details  Verbal cues for sequencing;Verbal cues for technique;Tactile cues for weight shifting;Manual facilitation for weight shifting;Manual facilitation for placement;Manual facilitation for weight bearing    Sit to Stand Details (indicate cue type and reason)  from transport chair to NuStep and from transport chair to up in // bars; verbal cueing to scoot towards edge of chair prior to transfer    Stand to Sit  4: Min assist;3: Mod assist;With upper extremity assist    Stand to Sit Details (indicate cue type and reason)  Verbal cues for sequencing;Verbal cues for technique;Manual facilitation for weight shifting    Stand to Sit Details  cueing to control descent and "sit back" into  chair    Comments  performed multiple times in // bars - 10# added to R ankle to decrease reflexive hip/knee flexion; 1/2 stagger stance bilaterally working on anterior/posterior weight shifting for carryover into hopeful eventual gait training for forward advancement; requires verbal/tactile cueing throughout for weight shifting and weight bearing thorugh LE with upright posturing; R LE weight bearing with fwd/bwd advancement of L LE needed for pre-gait activities      Therapeutic Activites    Therapeutic Activities  Other Therapeutic Activities    Other Therapeutic Activities  contonued education on need for stretching as well as static stance at sinkside to promote increased endurace as well as general bone health with weight bearing      Knee/Hip Exercises: Aerobic   Nustep  L3 x 8 min with LE only - goal of >/= 50 steps/minute for improved endurance and activity tolerance             PT Education - 03/06/18 1306    Education Details  education on reasoning for therapeutic activities for carryover into gait; need for continued stretching and standing with caregiver    Person(s) Educated  Patient;Spouse    Methods  Explanation    Comprehension  Verbalized understanding       PT Short Term Goals - 02/13/18 0809      PT SHORT TERM GOAL #1   Title  Patient & wife demonstrate & verbalize understanding of initial HEP. (All STGs Target Date: 03/15/2018)    Time  1    Period  Months    Status  New    Target Date  03/15/18      PT SHORT TERM GOAL #2   Title  Patient performs scooting transfers right & left between level surfaces with minimal guard.    Time  1    Period  Months    Status  New    Target Date  03/15/18      PT SHORT TERM GOAL #3   Title  Patient sit to/from supine with patient 50% assist with LE movements.     Time  1    Period  Months    Status  New    Target Date  03/15/18      PT SHORT TERM GOAL #4   Title  Sit to/from stand w/c to sink support, static 2  minutes & scans right/left, up/down with minimal guard.  Time  1    Period  Months    Status  New    Target Date  03/15/18      PT SHORT TERM GOAL #5   Title  Patient ambulates 10' with RW with moderate assist.     Time  1    Period  Months    Status  New    Target Date  03/15/18        PT Long Term Goals - 02/13/18 0758      PT LONG TERM GOAL #1   Title  Patient & wife demonstrate & verbalize understanding of ongoing exercise / fitness plan including community programs. (All LTGs Target Date: 05/14/2018)    Time  3    Period  Months    Status  New    Target Date  05/14/18      PT LONG TERM GOAL #2   Title  w/c to bed (mat at similar ht to his bed) scooting or squat pivot transfer modified independent.    Time  3    Period  Months    Status  New    Target Date  05/14/18      PT LONG TERM GOAL #3   Title  Sit to/from supine modified independent.    Time  3    Period  Months    Status  New    Target Date  05/14/18      PT LONG TERM GOAL #4   Title  Sit to/from stand chairs with armrests to RW with supervision.    Time  3    Period  Months    Status  New    Target Date  05/14/18      PT LONG TERM GOAL #5   Title  Patient standing balance with RW with BUE support staticly 2 minutes, scans environment and reaches within length of arm for objects with supervision.     Time  3    Period  Months    Status  New    Target Date  05/14/18      Additional Long Term Goals   Additional Long Term Goals  Yes      PT LONG TERM GOAL #6   Title  Patient ambulates 42' with RW with minA.     Time  3    Period  Months    Status  New    Target Date  05/14/18            Plan - 03/06/18 1258    Clinical Impression Statement  Patient doing well ansd motivated for PT session. PT session continuing to focus on improving independence with transfers as well as weight bearing through R LE and increasing standing time to promote improved endurance and activity tolerance.  Requires seated rest breaks due to fatigue but good progression of anterior/posterior weight shifting today in stagger stance. No episodes of R Knee buckling/flexion pattern with weight bearing today. Will continue to benefit from PT to progress safe functional mobility and progres towards LTGs.     Clinical Impairments Affecting Rehab Potential  weakness & ROM impaired 4 extremities & trunk; dependency transfers, bed mobility, standing & gait    PT Frequency  2x / week    PT Duration  Other (comment)   13 weeks, 90 days   PT Treatment/Interventions  ADLs/Self Care Home Management;Canalith Repostioning;Moist Heat;DME Instruction;Gait training;Stair training;Functional mobility training;Therapeutic activities;Therapeutic exercise;Balance training;Neuromuscular re-education;Patient/family education;Manual techniques;Passive range of motion;Vestibular  PT Next Visit Plan  Warm up on Nustep with LE only. continue to work on standing at sink/in parallel bars - weight shifting to RLE, extension through RLE (terminal knee extension) while advancing LLE in different directions, gait in parallel bars with 2 person assist, LE stretching/strengthening - terminal hip and knee extension    PT Home Exercise Plan  Access Code: [email or web address]     Consulted and Agree with Plan of Care  Patient;Family member/caregiver    Family Member Consulted  wife       Patient will benefit from skilled therapeutic intervention in order to improve the following deficits and impairments:  Abnormal gait, Cardiopulmonary status limiting activity, Decreased activity tolerance, Decreased balance, Decreased endurance, Decreased knowledge of use of DME, Decreased mobility, Decreased range of motion, Decreased strength, Difficulty walking, Dizziness, Impaired flexibility, Impaired UE functional use, Postural dysfunction  Visit Diagnosis: Muscle weakness (generalized)  Contracture of muscle, multiple sites  Other abnormalities of gait and  mobility  Abnormal posture     Problem List Patient Active Problem List   Diagnosis Date Noted  . 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 Acute respiratory failure with hypoxemia ([place]) 08/18/2017  . Hypokalemia 08/18/2017  . Leukocytosis 08/18/2017  . Muscle atrophy of lower extremity 08/09/2017  . Bowel and bladder incontinence 08/09/2017  . Right-sided muscle weakness 08/09/2017  . Neuropathy 08/09/2017  . 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/2017  . Abnormality of gait 07/12/2017  . Myoclonic jerkings, 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/2017  . Acquired right foot drop 07/12/2017  . UTI (urinary tract infection) due to Enterococcus 07/12/2017  . Pressure injury of skin 06/21/2017  . Acute cystitis 06/20/2017  . Generalized weakness 06/20/2017  . Dehydration 06/20/2017  . Dyspnea 12/26/2016  . Chronic cough 12/26/2016  . Hypersomnia with sleep apnea 10/30/2016  . CSA (central sleep apnea) 10/30/2016  . Wheezing symptom 10/30/2016  . CPAP/BiPAP dependent 07/01/2015  . 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 RLS (restless legs syndrome) 08/25/2014  . Primary gout 08/25/2014  . OSA on CPAP 08/25/2014  . Severe obesity (BMI >= 40) ([facility]) 08/25/2014  . Obesity (BMI 30-39.9) 02/18/2013  . Sleep apnea with use of continuous positive airway pressure (CPAP)   . 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 S/P TKR (total knee replacement) 11/29/2012  . History of thyroid cancer      Lanney Gins, PT, DPT Supplemental Physical Therapist 03/06/18 1:08 PM Pager: [phone] Office: [facility] [address] [place], [place], [address] Phone: [phone]   Fax:  [phone]  Name: [name] MRN: [id number] Date of Birth: 1940/10/14

## 2018-03-11 ENCOUNTER — Ambulatory Visit: Payer: Medicare Other | Admitting: Physical Therapy

## 2018-03-11 ENCOUNTER — Encounter: Payer: Self-pay | Admitting: Physical Therapy

## 2018-03-11 DIAGNOSIS — R293 Abnormal posture: Secondary | ICD-10-CM

## 2018-03-11 DIAGNOSIS — M6249 Contracture of muscle, multiple sites: Secondary | ICD-10-CM

## 2018-03-11 DIAGNOSIS — M6281 Muscle weakness (generalized): Secondary | ICD-10-CM | POA: Diagnosis not present

## 2018-03-11 DIAGNOSIS — R2681 Unsteadiness on feet: Secondary | ICD-10-CM

## 2018-03-12 NOTE — Therapy (Signed)
[facility] [address] [place], [place], [address] Phone: [phone]   Fax:  [phone]  Physical Therapy Treatment  Patient Details  Name: [name] MRN: [id number] Date of Birth: 1941/04/29 Referring Provider: Larey Seat, MD   Encounter Date: 03/11/2018   03/11/18 1107  PT Visits / Re-Eval  Visit Number 7  Number of Visits 26  Date for PT Re-Evaluation 05/10/18  Authorization  Authorization Type [employer] Pt has met $5900 oop, full coverage,   PT Time Calculation  PT Start Time 1102  PT Stop Time 1143  PT Time Calculation (min) 41 min  PT - End of Session  Equipment Utilized During Treatment Gait belt  Activity Tolerance Patient tolerated treatment well;Patient limited by fatigue;No increased pain  Behavior During Therapy WFL for tasks assessed/performed     Past Medical History:  Diagnosis Date  . Arthritis   . Barrett esophagus   . Bronchitis   . CAD (coronary artery disease)    pt's wife denies any cardiac history on pt.  . Cancer ([facility])    thyroid  . Carotid artery occlusion   . Chronic kidney disease    denies  . Constipation   . COPD (chronic obstructive pulmonary disease) ([facility])   . Diabetes mellitus without complication ([facility] [place])   . Dyspnea   . GERD (gastroesophageal reflux disease)   . Headache    migraines  . History of thyroid cancer 2008  . History of thyroid cancer   . Hypertension   . OSA (obstructive sleep apnea)   . Pneumonia   . Restless leg   . S/P tendon repair    left thumb  . Sleep apnea with use of continuous positive airway pressure (CPAP)    [facility] , AHI  77cn central and obstrcutive. 16 cm water , 3 cm EPR.   . Thoracic ascending aortic aneurysm (HCC)    4.2 cm ascending TAA 08/18/17 CTA. Annual imaging recommended.  . Thyroid disease   . Ulcer    Peptic ulcer disease    Past Surgical History:  Procedure Laterality Date  . ANTERIOR  CERVICAL DECOMP/DISCECTOMY FUSION N/A 09/03/2017   Procedure: ACDF - C3-C4 - C4-C5 - C5-C6;  Surgeon: Kary Kos, MD;  Location: [facility];  Service: Neurosurgery;  Laterality: N/A;  . CAROTID ENDARTERECTOMY Left January 03, 2006   Dr. Amedeo Plenty  . COLONOSCOPY    . EYE SURGERY Bilateral    cataract surgery with lens implants  . game keepers thumb Right   . JOINT REPLACEMENT Left    knee X 2  . ROTATOR CUFF REPAIR Right   . THYROIDECTOMY  2008    There were no vitals filed for this visit.      03/11/18 1106  Symptoms/Limitations  Subjective No new complaints. HEP is going well. Stood twice at sink on Saturday. Still with some left arm soreness from flu shot. No falls.   Patient is accompained by: Family member (spouse)  Pertinent History CAD, progressive muscular dystrophy, DM2, COPD, hypoxic respiratory failure, HTN,  Patient Stated Goals To walk in home & community, wife wants him to be able to care for himself if something happens to her  Pain Assessment  Currently in Pain? No/denies  Pain Score 0       03/11/18 1108  Transfers  Transfers Sit to Stand;Stand to Sit  Sit to Stand 4: Min assist;3: Mod assist  Sit to Stand Details Verbal cues for sequencing;Verbal cues  for technique;Tactile cues for weight shifting;Manual facilitation for weight shifting;Manual facilitation for placement;Manual facilitation for weight bearing  Sit to Stand Details (indicate cue type and reason) x3 reps to sink, then 2 reps at parallel bars. cues/facilitation needed for technique, foot placement and weight shifing.                     Stand to Sit 4: Min assist;With upper extremity assist;To chair/3-in-1  Stand to Sit Details (indicate cue type and reason) Verbal cues for sequencing;Verbal cues for technique;Manual facilitation for weight shifting  Stand to Sit Details cues/assist needed for controlled descent each time.   Lateral/Scoot Transfers 4: Min guard  [employer] officer, [employer] Details (indicate cue  type and reason) transport chair to/from Nustep with pt performing lateral scooting with UE assist.   Comments at sink: pt was able to stand for just over 2 mintues with 1st stand, then between 1-2 mintues with each consecutive stand. worked on posture, bil knee extension, progressing to lateral weight shifting with each stand at the sink. in parallel bars: worked on posture, progresing to increased right weight shifting with cues/facilitation for left LE stepping fwd/bwd, then out/in (laterally) for 5 reps each. pt needed up to min assist at sink for balance and mod assist with left knee blocked in parallel bars. 9# ankle weight used with all stands to facilitate left knee/hip extension in standing/decrease reflexive hip/knee flexion.                               Knee/Hip Exercises: Aerobic  Nustep Level 3 with LE only with goal >/= 50 steps per minute x8 mintues for strengthening and activity tolerance      PT Short Term Goals - 03/11/18 1114      PT SHORT TERM GOAL #1   Title  Patient & wife demonstrate & verbalize understanding of initial HEP. (All STGs Target Date: 03/15/2018)    Baseline  03/11/18: met with current HEP    Status  Achieved      PT SHORT TERM GOAL #2   Title  Patient performs scooting transfers right & left between level surfaces with minimal guard.    Baseline  03/11/18: met today    Time  --    Period  --    Status  Achieved      PT SHORT TERM GOAL #3   Title  Patient sit to/from supine with patient 50% assist with LE movements.     Time  1    Period  Months    Status  On-going      PT SHORT TERM GOAL #4   Title  Sit to/from stand w/c to sink support, static 2 minutes & scans right/left, up/down with minimal guard.     Baseline  03/11/18: pt was able to stand for 2 minutes with scanning, continues to need up to min assist at times with transfers and standing balance.    Time  --    Period  --    Status  Partially Met      PT SHORT TERM GOAL #5   Title  Patient  ambulates 48' with RW with moderate assist.     Time  1    Period  Months    Status  On-going            PT Long Term Goals - 02/13/18 0758      PT LONG  TERM GOAL #1   Title  Patient & wife demonstrate & verbalize understanding of ongoing exercise / fitness plan including community programs. (All LTGs Target Date: 05/14/2018)    Time  3    Period  Months    Status  New    Target Date  05/14/18      PT LONG TERM GOAL #2   Title  w/c to bed (mat at similar ht to his bed) scooting or squat pivot transfer modified independent.    Time  3    Period  Months    Status  New    Target Date  05/14/18      PT LONG TERM GOAL #3   Title  Sit to/from supine modified independent.    Time  3    Period  Months    Status  New    Target Date  05/14/18      PT LONG TERM GOAL #4   Title  Sit to/from stand chairs with armrests to RW with supervision.    Time  3    Period  Months    Status  New    Target Date  05/14/18      PT LONG TERM GOAL #5   Title  Patient standing balance with RW with BUE support staticly 2 minutes, scans environment and reaches within length of arm for objects with supervision.     Time  3    Period  Months    Status  New    Target Date  05/14/18      Additional Long Term Goals   Additional Long Term Goals  Yes      PT LONG TERM GOAL #6   Title  Patient ambulates 48' with RW with minA.     Time  3    Period  Months    Status  New    Target Date  05/14/18         03/11/18 1107  Plan  Clinical Impression Statement Today's skilled session continued to address strengthening, activity tolerance and pregait activities while also beginning to address progress toward STGs. The pt has met 2 STGs checked and progressed toward 1 other one. Will plan to check remaining STGs at next session. The pt is progressing toward goals and should benefit from continued PT to progress toward unmet goals.   Pt will benefit from skilled therapeutic intervention in order to  improve on the following deficits Abnormal gait;Cardiopulmonary status limiting activity;Decreased activity tolerance;Decreased balance;Decreased endurance;Decreased knowledge of use of DME;Decreased mobility;Decreased range of motion;Decreased strength;Difficulty walking;Dizziness;Impaired flexibility;Impaired UE functional use;Postural dysfunction  Clinical Impairments Affecting Rehab Potential weakness & ROM impaired 4 extremities & trunk; dependency transfers, bed mobility, standing & gait  PT Frequency 2x / week  PT Duration Other (comment) (13 weeks, 90 days)  PT Treatment/Interventions ADLs/Self Care Home Management;Canalith Repostioning;Moist Heat;DME Instruction;Gait training;Stair training;Functional mobility training;Therapeutic activities;Therapeutic exercise;Balance training;Neuromuscular re-education;Patient/family education;Manual techniques;Passive range of motion;Vestibular  PT Next Visit Plan check remaining STGs. continue with Nustep for warm up with emphasis on knee extension; continued to work on standing with emphasis on posture/knee extension (R >L), gait in parallel bars wtih 2 person assist.   PT Home Exercise Plan Access Code: [email or web address]   Consulted and Agree with Plan of Care Patient;Family member/caregiver  Family Member Consulted wife          Patient will benefit from skilled therapeutic intervention in order to improve the following deficits and impairments:  Abnormal gait, Cardiopulmonary status  limiting activity, Decreased activity tolerance, Decreased balance, Decreased endurance, Decreased knowledge of use of DME, Decreased mobility, Decreased range of motion, Decreased strength, Difficulty walking, Dizziness, Impaired flexibility, Impaired UE functional use, Postural dysfunction  Visit Diagnosis: Muscle weakness (generalized)  Contracture of muscle, multiple sites  Abnormal posture  Unsteadiness on feet     Problem List Patient Active Problem List    Diagnosis Date Noted  . 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 Acute respiratory failure with hypoxemia ([facility]) 08/18/2017  . Hypokalemia 08/18/2017  . Leukocytosis 08/18/2017  . Muscle atrophy of lower extremity 08/09/2017  . Bowel and bladder incontinence 08/09/2017  . Right-sided muscle weakness 08/09/2017  . Neuropathy 08/09/2017  . 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/2017  . Abnormality of gait 07/12/2017  . Myoclonic jerkings, 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/2017  . Acquired right foot drop 07/12/2017  . UTI (urinary tract infection) due to Enterococcus 07/12/2017  . Pressure injury of skin 06/21/2017  . Acute cystitis 06/20/2017  . Generalized weakness 06/20/2017  . Dehydration 06/20/2017  . Dyspnea 12/26/2016  . Chronic cough 12/26/2016  . Hypersomnia with sleep apnea 10/30/2016  . CSA (central sleep apnea) 10/30/2016  . Wheezing symptom 10/30/2016  . CPAP/BiPAP dependent 07/01/2015  . 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 RLS (restless legs syndrome) 08/25/2014  . Primary gout 08/25/2014  . OSA on CPAP 08/25/2014  . Severe obesity (BMI >= 40) ([facility]) 08/25/2014  . Obesity (BMI 30-39.9) 02/18/2013  . Sleep apnea with use of continuous positive airway pressure (CPAP)   . 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 S/P TKR (total knee replacement) 11/29/2012  . History of thyroid cancer     Willow Ora, PTA, [facility] [address], [place], [place] [address] [phone] 03/12/18, 10:45 PM   Name: [name] MRN: [id number] Date of Birth: [birth year]

## 2018-03-13 ENCOUNTER — Encounter: Payer: Self-pay | Admitting: Physical Therapy

## 2018-03-13 ENCOUNTER — Ambulatory Visit: Payer: Medicare Other | Admitting: Physical Therapy

## 2018-03-13 DIAGNOSIS — M6281 Muscle weakness (generalized): Secondary | ICD-10-CM | POA: Diagnosis not present

## 2018-03-13 DIAGNOSIS — R293 Abnormal posture: Secondary | ICD-10-CM

## 2018-03-13 DIAGNOSIS — R2681 Unsteadiness on feet: Secondary | ICD-10-CM

## 2018-03-13 DIAGNOSIS — R2689 Other abnormalities of gait and mobility: Secondary | ICD-10-CM

## 2018-03-13 DIAGNOSIS — M6249 Contracture of muscle, multiple sites: Secondary | ICD-10-CM

## 2018-03-14 NOTE — Therapy (Signed)
[facility] [address] [place], [place], [address] Phone: [phone]   Fax:  [phone]  Physical Therapy Treatment  Patient Details  Name: [name] MRN: [id number] Date of Birth: 03/05/1941 Referring Provider (PT): Larey Seat, MD   Encounter Date: 03/13/2018  PT End of Session - 03/13/18 1109    Visit Number  8    Number of Visits  26    Date for PT Re-Evaluation  05/10/18    Authorization Type  [employer] Pt has met $5900 oop, full coverage,     PT Start Time  1105    PT Stop Time  1145    PT Time Calculation (min)  40 min    Equipment Utilized During Treatment  Gait belt    Activity Tolerance  Patient tolerated treatment well;Patient limited by fatigue;No increased pain    Behavior During Therapy  WFL for tasks assessed/performed       Past Medical History:  Diagnosis Date  . Arthritis   . Barrett esophagus   . Bronchitis   . CAD (coronary artery disease)    pt's wife denies any cardiac history on pt.  . Cancer ([place])    thyroid  . Carotid artery occlusion   . Chronic kidney disease    denies  . Constipation   . COPD (chronic obstructive pulmonary disease) ([place])   . Diabetes mellitus without complication ([place])   . Dyspnea   . GERD (gastroesophageal reflux disease)   . Headache    migraines  . History of thyroid cancer 2008  . History of thyroid cancer   . Hypertension   . OSA (obstructive sleep apnea)   . Pneumonia   . Restless leg   . S/P tendon repair    left thumb  . Sleep apnea with use of continuous positive airway pressure (CPAP)    2011 [facility] , AHI  77cn central and obstrcutive. 16 cm water , 3 cm EPR.   . Thoracic ascending aortic aneurysm (HCC)    4.2 cm ascending TAA 08/18/17 CTA. Annual imaging recommended.  . Thyroid disease   . Ulcer    Peptic ulcer disease    Past Surgical History:  Procedure Laterality Date  . ANTERIOR CERVICAL DECOMP/DISCECTOMY FUSION  N/A 09/03/2017   Procedure: ACDF - C3-C4 - C4-C5 - C5-C6;  Surgeon: Kary Kos, MD;  Location: [facility];  Service: Neurosurgery;  Laterality: N/A;  . CAROTID ENDARTERECTOMY Left January 03, 2006   Dr. Amedeo Plenty  . COLONOSCOPY    . EYE SURGERY Bilateral    cataract surgery with lens implants  . game keepers thumb Right   . JOINT REPLACEMENT Left    knee X 2  . ROTATOR CUFF REPAIR Right   . THYROIDECTOMY  2008    There were no vitals filed for this visit.  Subjective Assessment - 03/13/18 1107    Subjective  No new complaints. No falls. Reports the left arm soreness is improving. Reports he took some tylenol before coming here today.     Patient is accompained by:  Family member   spouse   Pertinent History  CAD, progressive muscular dystrophy, DM2, COPD, hypoxic respiratory failure, HTN,    Patient Stated Goals  To walk in home & community, wife wants him to be able to care for himself if something happens to her    Currently in Pain?  Yes    Pain Score  6     Pain Location  Arm    Pain Descriptors / Indicators  Sore    Pain Type  Acute pain;Chronic pain    Pain Onset  1 to 4 weeks ago    Pain Frequency  Intermittent    Aggravating Factors   certain movements aggrivate it    Pain Relieving Factors  tylenol, repositioning the arm           [facility] Adult PT Treatment/Exercise - 03/13/18 1110      Bed Mobility   Bed Mobility  Supine to Sit;Sit to Supine    Supine to Sit  Moderate Assistance - Patient 50-74%    Supine to Sit Details (indicate cue type and reason)  assist to control descent of trunk into lying down. once LEs cleared surface pt was able to move them across mat table    Sit to Supine  Moderate Assistance - Patient 50-74%;Minimal Assistance - Patient > 75%    Sit to Supine - Details (indicate cue type and reason)  most assit needed with elevation of trunk into sitting, pt was able to bring legs over and off edge of mat without assistance      Transfers   Transfers  Sit to  Stand;Stand to Sit    Sit to Stand  4: Min assist;With upper extremity assist;From chair/3-in-1    Sit to Stand Details  Verbal cues for sequencing;Verbal cues for technique;Tactile cues for weight shifting;Manual facilitation for weight shifting;Manual facilitation for placement;Manual facilitation for weight bearing    Stand to Sit  4: Min assist;With upper extremity assist;To chair/3-in-1    Stand to Sit Details (indicate cue type and reason)  Verbal cues for sequencing;Verbal cues for technique;Manual facilitation for weight shifting    Lateral/Scoot Transfers  4: Min guard    Lateral/Scoot Transfer Details (indicate cue type and reason)  transport chair to/from Nustep, then to/from mat table. uphill into Nustep/downhill into Chair. level surfaces between transport chair<>mat table.       Ambulation/Gait   Ambulation/Gait  Yes    Ambulation/Gait Assistance  4: Min assist   of 2 people with spouse bringing chair behind him   Ambulation/Gait Assistance Details  gait in parallel bars with PTA seated in front of pt and spouse bringing chair behind him. used 9# weight on right LE to facilitate extension, therefore some assist needed with advancement of right LE, pt able to self advance left LE , all with min assist; with RW: 2 person min assist with spouse bringing chair behind him. ankle weight removed with pt self advancing bil LEs with decreased step length. cues needed on posture, weight shifting and knee extension with stance. scissoring for last 5-8 feet of gait with continued on min assist of 2 needed.                        Ambulation Distance (Feet)  10 Feet   x1, 30 x1   Assistive device  Parallel bars;Rolling walker    Gait Pattern  Step-through pattern;Decreased stride length;Decreased step length - right;Decreased step length - left;Scissoring;Shuffle;Narrow base of support;Poor foot clearance - left;Poor foot clearance - right    Ambulation Surface  Level;Indoor      Knee/Hip  Exercises: Aerobic   Nustep  Level 3 with LE only with goal >/= 50 steps per minute x8 mintues for strengthening and activity tolerance          PT Short Term Goals - 03/14/18 1251      PT  SHORT TERM GOAL #1   Title  Patient & wife demonstrate & verbalize understanding of initial HEP. (All STGs Target Date: 03/15/2018)    Baseline  03/11/18: met with current HEP    Status  Achieved      PT SHORT TERM GOAL #2   Title  Patient performs scooting transfers right & left between level surfaces with minimal guard.    Baseline  03/11/18: met today    Status  Achieved      PT SHORT TERM GOAL #3   Title  Patient sit to/from supine with patient 50% assist with LE movements.     Baseline  03/13/18: met today    Status  Achieved      PT SHORT TERM GOAL #4   Title  Sit to/from stand w/c to sink support, static 2 minutes & scans right/left, up/down with minimal guard.     Baseline  03/11/18: pt was able to stand for 2 minutes with scanning, continues to need up to min assist at times with transfers and standing balance.    Status  Partially Met      PT SHORT TERM GOAL #5   Title  Patient ambulates 86' with RW with moderate assist.     Baseline  03/13/18: met today with >20 feet with RW with min assist of 2     Status  Achieved        PT Long Term Goals - 02/13/18 0758      PT LONG TERM GOAL #1   Title  Patient & wife demonstrate & verbalize understanding of ongoing exercise / fitness plan including community programs. (All LTGs Target Date: 05/14/2018)    Time  3    Period  Months    Status  New    Target Date  05/14/18      PT LONG TERM GOAL #2   Title  w/c to bed (mat at similar ht to his bed) scooting or squat pivot transfer modified independent.    Time  3    Period  Months    Status  New    Target Date  05/14/18      PT LONG TERM GOAL #3   Title  Sit to/from supine modified independent.    Time  3    Period  Months    Status  New    Target Date  05/14/18      PT LONG TERM  GOAL #4   Title  Sit to/from stand chairs with armrests to RW with supervision.    Time  3    Period  Months    Status  New    Target Date  05/14/18      PT LONG TERM GOAL #5   Title  Patient standing balance with RW with BUE support staticly 2 minutes, scans environment and reaches within length of arm for objects with supervision.     Time  3    Period  Months    Status  New    Target Date  05/14/18      Additional Long Term Goals   Additional Long Term Goals  Yes      PT LONG TERM GOAL #6   Title  Patient ambulates 62' with RW with minA.     Time  3    Period  Months    Status  New    Target Date  05/14/18          Plan - 03/13/18  1109    Clinical Impression Statement  Pt met remaining STGs today. Continued to work on LE strengthening, hip/knee extension, standing posture/balance and gait. The pt was able to walk in parallel bars with 1 person assist today and with RW with 2 person assist. He is progressing toward goals and should benefit from continued PT to progress toward unmet goals.                   Clinical Impairments Affecting Rehab Potential  weakness & ROM impaired 4 extremities & trunk; dependency transfers, bed mobility, standing & gait    PT Frequency  2x / week    PT Duration  Other (comment)   13 weeks, 90 days   PT Treatment/Interventions  ADLs/Self Care Home Management;Canalith Repostioning;Moist Heat;DME Instruction;Gait training;Stair training;Functional mobility training;Therapeutic activities;Therapeutic exercise;Balance training;Neuromuscular re-education;Patient/family education;Manual techniques;Passive range of motion;Vestibular    PT Next Visit Plan  chcontinue with Nustep for warm up with emphasis on knee extension; continued to work on standing with emphasis on posture/knee extension (R >L), gait in parallel bars wtih 2 person assist.     PT Home Exercise Plan  Access Code: [email or web address]     Consulted and Agree with Plan of Care  Patient;Family  member/caregiver    Family Member Consulted  wife       Patient will benefit from skilled therapeutic intervention in order to improve the following deficits and impairments:  Abnormal gait, Cardiopulmonary status limiting activity, Decreased activity tolerance, Decreased balance, Decreased endurance, Decreased knowledge of use of DME, Decreased mobility, Decreased range of motion, Decreased strength, Difficulty walking, Dizziness, Impaired flexibility, Impaired UE functional use, Postural dysfunction  Visit Diagnosis: Muscle weakness (generalized)  Contracture of muscle, multiple sites  Abnormal posture  Unsteadiness on feet  Other abnormalities of gait and mobility     Problem List Patient Active Problem List   Diagnosis Date Noted  . 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 Acute respiratory failure with hypoxemia ([place]) 08/18/2017  . Hypokalemia 08/18/2017  . Leukocytosis 08/18/2017  . Muscle atrophy of lower extremity 08/09/2017  . Bowel and bladder incontinence 08/09/2017  . Right-sided muscle weakness 08/09/2017  . Neuropathy 08/09/2017  . 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/2017  . Abnormality of gait 07/12/2017  . Myoclonic jerkings, 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/2017  . Acquired right foot drop 07/12/2017  . UTI (urinary tract infection) due to Enterococcus 07/12/2017  . Pressure injury of skin 06/21/2017  . Acute cystitis 06/20/2017  . Generalized weakness 06/20/2017  . Dehydration 06/20/2017  . Dyspnea 12/26/2016  . Chronic cough 12/26/2016  . Hypersomnia with sleep apnea 10/30/2016  . CSA (central sleep apnea) 10/30/2016  . Wheezing symptom 10/30/2016  . CPAP/BiPAP dependent 07/01/2015  . 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 RLS (restless legs syndrome) 08/25/2014  . Primary gout 08/25/2014  . OSA on CPAP 08/25/2014  . Severe obesity (BMI >= 40) ([facility]) 08/25/2014  . Obesity (BMI 30-39.9) 02/18/2013  . Sleep apnea with use of continuous positive  airway pressure (CPAP)   . 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 S/P TKR (total knee replacement) 11/29/2012  . History of thyroid cancer     Willow Ora, PTA, [facility] [address], [place], [place] [address] [phone] 03/14/18, 12:56 PM   Name: [name] MRN: [id number] Date of Birth: [birth year]

## 2018-03-18 ENCOUNTER — Ambulatory Visit: Payer: Medicare Other | Admitting: Physical Therapy

## 2018-03-18 ENCOUNTER — Telehealth: Payer: Self-pay | Admitting: Physical Therapy

## 2018-03-18 ENCOUNTER — Encounter: Payer: Self-pay | Admitting: Physical Therapy

## 2018-03-18 DIAGNOSIS — R2681 Unsteadiness on feet: Secondary | ICD-10-CM

## 2018-03-18 DIAGNOSIS — R293 Abnormal posture: Secondary | ICD-10-CM

## 2018-03-18 DIAGNOSIS — R2689 Other abnormalities of gait and mobility: Secondary | ICD-10-CM

## 2018-03-18 DIAGNOSIS — Z9181 History of falling: Secondary | ICD-10-CM

## 2018-03-18 DIAGNOSIS — M6249 Contracture of muscle, multiple sites: Secondary | ICD-10-CM

## 2018-03-18 DIAGNOSIS — M6281 Muscle weakness (generalized): Secondary | ICD-10-CM | POA: Diagnosis not present

## 2018-03-18 NOTE — Therapy (Addendum)
[facility] [address] [place], [place], [address] Phone: [phone]   Fax:  [phone]  Physical Therapy Treatment  Patient Details  Name: [name] MRN: [id number] Date of Birth: 1940/10/05 Referring Provider (PT): Larey Seat, MD   Encounter Date: 03/18/2018  PT End of Session - 03/18/18 1223    Visit Number  9    Number of Visits  26    Date for PT Re-Evaluation  05/10/18    Authorization Type  [employer] Pt has met $5900 oop, full coverage, 10th visit PN    PT Start Time  1107    PT Stop Time  1150    PT Time Calculation (min)  43 min    Equipment Utilized During Treatment  Gait belt    Activity Tolerance  Patient tolerated treatment well;Patient limited by pain    Behavior During Therapy  [facility] for tasks assessed/performed       Past Medical History:  Diagnosis Date  . Arthritis   . Barrett esophagus   . Bronchitis   . CAD (coronary artery disease)    pt's wife denies any cardiac history on pt.  . Cancer ([facility])    thyroid  . Carotid artery occlusion   . Chronic kidney disease    denies  . Constipation   . COPD (chronic obstructive pulmonary disease) ([place])   . Diabetes mellitus without complication ([facility])   . Dyspnea   . GERD (gastroesophageal reflux disease)   . Headache    migraines  . History of thyroid cancer 2008  . History of thyroid cancer   . Hypertension   . OSA (obstructive sleep apnea)   . Pneumonia   . Restless leg   . S/P tendon repair    left thumb  . Sleep apnea with use of continuous positive airway pressure (CPAP)    [facility] , AHI  77cn central and obstrcutive. 16 cm water , 3 cm EPR.   . Thoracic ascending aortic aneurysm (HCC)    4.2 cm ascending TAA 08/18/17 CTA. Annual imaging recommended.  . Thyroid disease   . Ulcer    Peptic ulcer disease    Past Surgical History:  Procedure Laterality Date  . ANTERIOR CERVICAL DECOMP/DISCECTOMY FUSION N/A  09/03/2017   Procedure: ACDF - C3-C4 - C4-C5 - C5-C6;  Surgeon: Kary Kos, MD;  Location: [facility];  Service: Neurosurgery;  Laterality: N/A;  . CAROTID ENDARTERECTOMY Left January 03, 2006   Dr. Amedeo Plenty  . COLONOSCOPY    . EYE SURGERY Bilateral    cataract surgery with lens implants  . game keepers thumb Right   . JOINT REPLACEMENT Left    knee X 2  . ROTATOR CUFF REPAIR Right   . THYROIDECTOMY  2008    There were no vitals filed for this visit.  Subjective Assessment - 03/18/18 1110    Subjective  Did some standing at the sink on Saturday but slept most of the day yesterday.  Still having some LUE soreness but it is better.      Patient is accompained by:  Family member   spouse   Pertinent History  CAD, progressive muscular dystrophy, DM2, COPD, hypoxic respiratory failure, HTN,    Patient Stated Goals  To walk in home & community, wife wants him to be able to care for himself if something happens to her    Pain Onset  1 to 4 weeks ago  [facility] Adult PT Treatment/Exercise - 03/18/18 1203      Bed Mobility   Bed Mobility  Rolling Right;Rolling Left;Supine to Sit;Sit to Supine    Rolling Right  Minimal Assistance - Patient > 75%    Rolling Right Details (indicate cue type and reason)  cues for technique, trunk flexion and shoulder protraction    Rolling Left  Minimal Assistance - Patient > 75%    Rolling Left Details (indicate cue type and reason)  Focus on safe rolling to L side with cues to bring LUE out to side to keep from rolling on top of it and use of trunk flexion and shoulder protraction for upper trunk rotation during rolling instead of pulling on caregiver.  Began with mod A from therapist - cues to push into therapist's shoulder to facilitate flexion and core activation progressing to supervision-min guard    Supine to Sit  Moderate Assistance - Patient 50-74%    Sit to Supine  Moderate Assistance - Patient 50-74%;Minimal Assistance - Patient  > 75%    Sit to Supine - Details (indicate cue type and reason)  also performed scooting to [facility] through bridging and cues to lift head off of bed instead of pressing into cervical extension during scooting up in bed      Transfers   Transfers  Sit to Stand;Stand to Sit;Stand Pivot Transfers;Lateral/Scoot Transfers    Sit to Stand  3: Mod assist    Sit to Stand Details (indicate cue type and reason)  from transport w/c initially holding RW with LUE and pushing up with RUE but then unable to release chair with RUE to shift weight forwards.  Changed to holding RW with RUE and pushing with LUE with improved ability to shift weight forwards and maintain weight forwards during sit > stand.      Stand to Sit  3: Mod assist    Stand Pivot Transfers  1: +2 Total assist;With armrests    Stand Pivot Transfer Details (indicate cue type and reason)  with RW, + 2 for safety due to increased weakness today, decreased trunk control and increased LOB to R side.  One episode of full collapse to R side with +2 to safely return to w/c.  Second attempt therapist provided increased assistance at trunk for L lateral weight shifting, upright trunk and cues for sequencing of pivot from chair > mat    Lateral/Scoot Transfers  4: Min guard    Lateral/Scoot Transfer Details (indicate cue type and reason)  mat > transport chair on L side      Therapeutic Activites    Therapeutic Activities  Other Therapeutic Activities    Other Therapeutic Activities  Discussed LUE pain, UE weakness and posture and recommendation to begin to involve OT in POC.  Pt and wife agreeable and feel it would be beneficial.  Will contact MD for orders.      Exercises   Exercises  Shoulder      Shoulder Exercises: Supine   Other Supine Exercises  Supine scapular retraction - unable to tolerate in supine today due to referred pain to L lateral UE             PT Education - 03/18/18 1223    Education Details  recommendation to call EMS if pt  has a fall-pt not appropriate for floor > furniture training at this time, OT, bed mobility    Person(s) Educated  Patient;Spouse    Methods  Explanation;Demonstration    Comprehension  Verbalized understanding;Returned demonstration       PT Short Term Goals - 03/14/18 1251      PT SHORT TERM GOAL #1   Title  Patient & wife demonstrate & verbalize understanding of initial HEP. (All STGs Target Date: 03/15/2018)    Baseline  03/11/18: met with current HEP    Status  Achieved      PT SHORT TERM GOAL #2   Title  Patient performs scooting transfers right & left between level surfaces with minimal guard.    Baseline  03/11/18: met today    Status  Achieved      PT SHORT TERM GOAL #3   Title  Patient sit to/from supine with patient 50% assist with LE movements.     Baseline  03/13/18: met today    Status  Achieved      PT SHORT TERM GOAL #4   Title  Sit to/from stand w/c to sink support, static 2 minutes & scans right/left, up/down with minimal guard.     Baseline  03/11/18: pt was able to stand for 2 minutes with scanning, continues to need up to min assist at times with transfers and standing balance.    Status  Partially Met      PT SHORT TERM GOAL #5   Title  Patient ambulates 41' with RW with moderate assist.     Baseline  03/13/18: met today with >20 feet with RW with min assist of 2     Status  Achieved        PT Long Term Goals - 02/13/18 0758      PT LONG TERM GOAL #1   Title  Patient & wife demonstrate & verbalize understanding of ongoing exercise / fitness plan including community programs. (All LTGs Target Date: 05/14/2018)    Time  3    Period  Months    Status  New    Target Date  05/14/18      PT LONG TERM GOAL #2   Title  w/c to bed (mat at similar ht to his bed) scooting or squat pivot transfer modified independent.    Time  3    Period  Months    Status  New    Target Date  05/14/18      PT LONG TERM GOAL #3   Title  Sit to/from supine modified  independent.    Time  3    Period  Months    Status  New    Target Date  05/14/18      PT LONG TERM GOAL #4   Title  Sit to/from stand chairs with armrests to RW with supervision.    Time  3    Period  Months    Status  New    Target Date  05/14/18      PT LONG TERM GOAL #5   Title  Patient standing balance with RW with BUE support staticly 2 minutes, scans environment and reaches within length of arm for objects with supervision.     Time  3    Period  Months    Status  New    Target Date  05/14/18      Additional Long Term Goals   Additional Long Term Goals  Yes      PT LONG TERM GOAL #6   Title  Patient ambulates 83' with RW with minA.     Time  3    Period  Months  Status  New    Target Date  05/14/18            Plan - 03/18/18 1224    Clinical Impression Statement  Treatment session today focused on transfer training attempting to progress to stand pivot with RW and bed mobility training with focus on increased core activation.  Pt currently not safe to perform stand pivot with RW at home yet due to +2 assistance required for safety.  Attempted to begin postural exercises in supine but pt unable to tolerate due to LUE pain.  Will pursue OT eval and treat to begin to address UE and scapular strength and ROM.  Pt and wife agreeable    Clinical Impairments Affecting Rehab Potential  weakness & ROM impaired 4 extremities & trunk; dependency transfers, bed mobility, standing & gait    PT Frequency  2x / week    PT Duration  Other (comment)   13 weeks, 90 days   PT Treatment/Interventions  ADLs/Self Care Home Management;Canalith Repostioning;Moist Heat;DME Instruction;Gait training;Stair training;Functional mobility training;Therapeutic activities;Therapeutic exercise;Balance training;Neuromuscular re-education;Patient/family education;Manual techniques;Passive range of motion;Vestibular    PT Next Visit Plan  10th visit PN!  Did OT order come back - need to schedule OT.   Nustep for warm up for LE Extension.  Sit <> stand and stand pivot transfers with RW.  Bed mobility and supine postural exercises.  Gait if +2.    PT Home Exercise Plan  Access Code: [email or web address]     Consulted and Agree with Plan of Care  Patient;Family member/caregiver    Family Member Consulted  wife       Patient will benefit from skilled therapeutic intervention in order to improve the following deficits and impairments:  Abnormal gait, Cardiopulmonary status limiting activity, Decreased activity tolerance, Decreased balance, Decreased endurance, Decreased knowledge of use of DME, Decreased mobility, Decreased range of motion, Decreased strength, Difficulty walking, Dizziness, Impaired flexibility, Impaired UE functional use, Postural dysfunction  Visit Diagnosis: Muscle weakness (generalized)  Abnormal posture  Unsteadiness on feet  Other abnormalities of gait and mobility  History of falling     Problem List Patient Active Problem List   Diagnosis Date Noted  . 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 Acute respiratory failure with hypoxemia ([facility]) 08/18/2017  . Hypokalemia 08/18/2017  . Leukocytosis 08/18/2017  . Muscle atrophy of lower extremity 08/09/2017  . Bowel and bladder incontinence 08/09/2017  . Right-sided muscle weakness 08/09/2017  . Neuropathy 08/09/2017  . 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/2017  . Abnormality of gait 07/12/2017  . Myoclonic jerkings, 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/2017  . Acquired right foot drop 07/12/2017  . UTI (urinary tract infection) due to Enterococcus 07/12/2017  . Pressure injury of skin 06/21/2017  . Acute cystitis 06/20/2017  . Generalized weakness 06/20/2017  . Dehydration 06/20/2017  . Dyspnea 12/26/2016  . Chronic cough 12/26/2016  . Hypersomnia with sleep apnea 10/30/2016  . CSA (central sleep apnea) 10/30/2016  . Wheezing symptom 10/30/2016  . CPAP/BiPAP dependent 07/01/2015  . 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  RLS (restless legs syndrome) 08/25/2014  . Primary gout 08/25/2014  . OSA on CPAP 08/25/2014  . Severe obesity (BMI >= 40) ([place]) 08/25/2014  . Obesity (BMI 30-39.9) 02/18/2013  . Sleep apnea with use of continuous positive airway pressure (CPAP)   . 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 S/P TKR (total knee replacement) 11/29/2012  . History of thyroid cancer    Letta Moynahan  Fritz Pickerel, PT, DPT 03/18/18    1:07 PM    [facility] [address] [place], [place], [address] Phone: [phone]   Fax:  [phone]  Name: [name] MRN: [id number] Date of Birth: May 05, 1941

## 2018-03-18 NOTE — Telephone Encounter (Signed)
Occupational therapy needed, see PT note and  progress report.

## 2018-03-18 NOTE — Telephone Encounter (Signed)
Hello Dr. Brett Fairy,  [name] is begging to do more activity in standing with increased WB through bilat UE.  He is also beginning to c/o LUE pain and demonstrates UE weakness and abnormal posture.  We feel he would benefit from occupational therapy to address these impairments.  If you agree, will you please enter an order in Epic for OT evaluation and treatment?  Thank you, Rico Junker, PT, DPT 03/18/18    1:02 PM

## 2018-03-20 ENCOUNTER — Encounter: Payer: Self-pay | Admitting: Physical Therapy

## 2018-03-20 ENCOUNTER — Ambulatory Visit: Payer: Medicare Other | Attending: Neurology | Admitting: Physical Therapy

## 2018-03-20 DIAGNOSIS — Z9181 History of falling: Secondary | ICD-10-CM | POA: Insufficient documentation

## 2018-03-20 DIAGNOSIS — M6249 Contracture of muscle, multiple sites: Secondary | ICD-10-CM | POA: Diagnosis present

## 2018-03-20 DIAGNOSIS — G8929 Other chronic pain: Secondary | ICD-10-CM | POA: Insufficient documentation

## 2018-03-20 DIAGNOSIS — R2681 Unsteadiness on feet: Secondary | ICD-10-CM

## 2018-03-20 DIAGNOSIS — R29818 Other symptoms and signs involving the nervous system: Secondary | ICD-10-CM | POA: Insufficient documentation

## 2018-03-20 DIAGNOSIS — M6281 Muscle weakness (generalized): Secondary | ICD-10-CM | POA: Insufficient documentation

## 2018-03-20 DIAGNOSIS — R269 Unspecified abnormalities of gait and mobility: Secondary | ICD-10-CM | POA: Diagnosis present

## 2018-03-20 DIAGNOSIS — R29898 Other symptoms and signs involving the musculoskeletal system: Secondary | ICD-10-CM | POA: Diagnosis present

## 2018-03-20 DIAGNOSIS — R2689 Other abnormalities of gait and mobility: Secondary | ICD-10-CM | POA: Insufficient documentation

## 2018-03-20 DIAGNOSIS — R293 Abnormal posture: Secondary | ICD-10-CM | POA: Diagnosis present

## 2018-03-20 DIAGNOSIS — R278 Other lack of coordination: Secondary | ICD-10-CM | POA: Insufficient documentation

## 2018-03-20 DIAGNOSIS — M25612 Stiffness of left shoulder, not elsewhere classified: Secondary | ICD-10-CM | POA: Diagnosis present

## 2018-03-20 DIAGNOSIS — M545 Low back pain: Secondary | ICD-10-CM | POA: Insufficient documentation

## 2018-03-20 NOTE — Therapy (Signed)
[facility] [address] [place], [place], [address] Phone: [phone]   Fax:  [phone]  Physical Therapy Treatment and 10th visit PN  Patient Details  Name: [name] MRN: [id number] Date of Birth: 06/27/1940 Referring Provider (PT): Larey Seat, MD   Encounter Date: 03/20/2018  PT End of Session - 03/20/18 1259    Visit Number  10    Number of Visits  26    Date for PT Re-Evaluation  05/10/18    Authorization Type  [employer] Pt has met $5900 oop, full coverage, 10th visit PN    PT Start Time  1108    PT Stop Time  1145    PT Time Calculation (min)  37 min    Equipment Utilized During Treatment  Gait belt    Activity Tolerance  Patient limited by fatigue    Behavior During Therapy  WFL for tasks assessed/performed       Past Medical History:  Diagnosis Date  . Arthritis   . Barrett esophagus   . Bronchitis   . CAD (coronary artery disease)    pt's wife denies any cardiac history on pt.  . Cancer ([place])    thyroid  . Carotid artery occlusion   . Chronic kidney disease    denies  . Constipation   . COPD (chronic obstructive pulmonary disease) ([place])   . Diabetes mellitus without complication ([place])   . Dyspnea   . GERD (gastroesophageal reflux disease)   . Headache    migraines  . History of thyroid cancer 2008  . History of thyroid cancer   . Hypertension   . OSA (obstructive sleep apnea)   . Pneumonia   . Restless leg   . S/P tendon repair    left thumb  . Sleep apnea with use of continuous positive airway pressure (CPAP)    2011 [facility] , AHI  77cn central and obstrcutive. 16 cm water , 3 cm EPR.   . Thoracic ascending aortic aneurysm (HCC)    4.2 cm ascending TAA 08/18/17 CTA. Annual imaging recommended.  . Thyroid disease   . Ulcer    Peptic ulcer disease    Past Surgical History:  Procedure Laterality Date  . ANTERIOR CERVICAL DECOMP/DISCECTOMY FUSION N/A 09/03/2017   Procedure: ACDF - C3-C4 - C4-C5 - C5-C6;  Surgeon: Kary Kos, MD;  Location: [facility];  Service: Neurosurgery;  Laterality: N/A;  . CAROTID ENDARTERECTOMY Left January 03, 2006   Dr. Amedeo Plenty  . COLONOSCOPY    . EYE SURGERY Bilateral    cataract surgery with lens implants  . game keepers thumb Right   . JOINT REPLACEMENT Left    knee X 2  . ROTATOR CUFF REPAIR Right   . THYROIDECTOMY  2008    There were no vitals filed for this visit.  Subjective Assessment - 03/20/18 1118    Subjective  Has not had a chance to practice the bed mobility from Monday; air conditioner went out and power went out.  Will bring sneakers next session.  OT order has been placed; will schedule eval today.  No UE pain today, typically feels it when lying down.    Patient is accompained by:  Family member   spouse   Pertinent History  CAD, progressive muscular dystrophy, DM2, COPD, hypoxic respiratory failure, HTN,    Patient Stated Goals  To walk in home & community, wife wants him to be able to care for himself if  something happens to her    Currently in Pain?  No/denies    Pain Onset  1 to 4 weeks ago                       [facility] Adult PT Treatment/Exercise - 03/20/18 1301      Transfers   Transfers  Sit to Stand;Stand to Sit    Sit to Stand  3: Mod assist    Sit to Stand Details (indicate cue type and reason)  verbal cues for more upright trunk during forward weight shift over BOS, knee extension activation and use of inhalation when transitioning from sit > stand    Stand to Sit  4: Min assist      Exercises   Exercises  Knee/Hip      Knee/Hip Exercises: Standing   Terminal Knee Extension  Strengthening;Right;Left;2 sets;10 reps;Theraband    Theraband Level (Terminal Knee Extension)  Level 3 (Green)    Terminal Knee Extension Limitations  in // bars.  First set pt performed isolated terminal knee extension with therapist providing min-mod A to maintain weight shift and upright trunk.  Second  set combined terminal knee extension of one LE with contralateral LE forward tapping 2" step x 5 reps each side to simulate SLS and contralateral LE advancement needed for standing transfers, gait, stairs, and negotiating obstacles             PT Education - 03/20/18 1257    Education Details  Bring sneakers to next appointment to trial AFO; need to review stretching program    Person(s) Educated  Patient;Spouse    Methods  Explanation    Comprehension  Verbalized understanding       PT Short Term Goals - 03/14/18 1251      PT SHORT TERM GOAL #1   Title  Patient & wife demonstrate & verbalize understanding of initial HEP. (All STGs Target Date: 03/15/2018)    Baseline  03/11/18: met with current HEP    Status  Achieved      PT SHORT TERM GOAL #2   Title  Patient performs scooting transfers right & left between level surfaces with minimal guard.    Baseline  03/11/18: met today    Status  Achieved      PT SHORT TERM GOAL #3   Title  Patient sit to/from supine with patient 50% assist with LE movements.     Baseline  03/13/18: met today    Status  Achieved      PT SHORT TERM GOAL #4   Title  Sit to/from stand w/c to sink support, static 2 minutes & scans right/left, up/down with minimal guard.     Baseline  03/11/18: pt was able to stand for 2 minutes with scanning, continues to need up to min assist at times with transfers and standing balance.    Status  Partially Met      PT SHORT TERM GOAL #5   Title  Patient ambulates 77' with RW with moderate assist.     Baseline  03/13/18: met today with >20 feet with RW with min assist of 2     Status  Achieved        PT Long Term Goals - 02/13/18 0758      PT LONG TERM GOAL #1   Title  Patient & wife demonstrate & verbalize understanding of ongoing exercise / fitness plan including community programs. (All LTGs Target Date: 05/14/2018)  Time  3    Period  Months    Status  New    Target Date  05/14/18      PT LONG TERM GOAL  #2   Title  w/c to bed (mat at similar ht to his bed) scooting or squat pivot transfer modified independent.    Time  3    Period  Months    Status  New    Target Date  05/14/18      PT LONG TERM GOAL #3   Title  Sit to/from supine modified independent.    Time  3    Period  Months    Status  New    Target Date  05/14/18      PT LONG TERM GOAL #4   Title  Sit to/from stand chairs with armrests to RW with supervision.    Time  3    Period  Months    Status  New    Target Date  05/14/18      PT LONG TERM GOAL #5   Title  Patient standing balance with RW with BUE support staticly 2 minutes, scans environment and reaches within length of arm for objects with supervision.     Time  3    Period  Months    Status  New    Target Date  05/14/18      Additional Long Term Goals   Additional Long Term Goals  Yes      PT LONG TERM GOAL #6   Title  Patient ambulates 79' with RW with minA.     Time  3    Period  Months    Status  New    Target Date  05/14/18            Plan - 03/20/18 1300    Clinical Impression Statement  Treatment session continued to focus on LE extension activation during sit <> stand and static standing to prepare for transfers and gait with decreased assistance.  Pt is making slow but steady progress towards goals and is demonstrating improved LE activation and tolerance to standing.  Still requires +2 for gait and standing transfers.  Pt to begin OT for UE strengthening, postural training and pain management next week.      Clinical Impairments Affecting Rehab Potential  weakness & ROM impaired 4 extremities & trunk; dependency transfers, bed mobility, standing & gait    PT Frequency  2x / week    PT Duration  Other (comment)   13 weeks, 90 days   PT Treatment/Interventions  ADLs/Self Care Home Management;Canalith Repostioning;Moist Heat;DME Instruction;Gait training;Stair training;Functional mobility training;Therapeutic activities;Therapeutic  exercise;Balance training;Neuromuscular re-education;Patient/family education;Manual techniques;Passive range of motion;Vestibular    PT Next Visit Plan  If he has sneakers-trial anterior AFO.  Review stretching program/add if needed; Nustep for warm up for LE Extension.  Sit <> stand and stand pivot transfers with RW.  Bed mobility and supine postural exercises.  Gait if +2.    PT Home Exercise Plan  Access Code: [email or web address]     Consulted and Agree with Plan of Care  Patient;Family member/caregiver    Family Member Consulted  wife       Patient will benefit from skilled therapeutic intervention in order to improve the following deficits and impairments:  Abnormal gait, Cardiopulmonary status limiting activity, Decreased activity tolerance, Decreased balance, Decreased endurance, Decreased knowledge of use of DME, Decreased mobility, Decreased range of motion, Decreased strength, Difficulty walking, Dizziness, Impaired  flexibility, Impaired UE functional use, Postural dysfunction  Visit Diagnosis: Unsteadiness on feet  Muscle weakness (generalized)  History of falling  Abnormal posture  Other abnormalities of gait and mobility     Problem List Patient Active Problem List   Diagnosis Date Noted  . 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 Acute respiratory failure with hypoxemia ([place]) 08/18/2017  . Hypokalemia 08/18/2017  . Leukocytosis 08/18/2017  . Muscle atrophy of lower extremity 08/09/2017  . Bowel and bladder incontinence 08/09/2017  . Right-sided muscle weakness 08/09/2017  . Neuropathy 08/09/2017  . 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/2017  . Abnormality of gait 07/12/2017  . Myoclonic jerkings, 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/2017  . Acquired right foot drop 07/12/2017  . UTI (urinary tract infection) due to Enterococcus 07/12/2017  . Pressure injury of skin 06/21/2017  . Acute cystitis 06/20/2017  . Generalized weakness 06/20/2017  . Dehydration 06/20/2017  . Dyspnea  12/26/2016  . Chronic cough 12/26/2016  . Hypersomnia with sleep apnea 10/30/2016  . CSA (central sleep apnea) 10/30/2016  . Wheezing symptom 10/30/2016  . CPAP/BiPAP dependent 07/01/2015  . 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 RLS (restless legs syndrome) 08/25/2014  . Primary gout 08/25/2014  . OSA on CPAP 08/25/2014  . Severe obesity (BMI >= 40) ([place]) 08/25/2014  . Obesity (BMI 30-39.9) 02/18/2013  . Sleep apnea with use of continuous positive airway pressure (CPAP)   . 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 S/P TKR (total knee replacement) 11/29/2012  . History of thyroid cancer    10th Visit Physical Therapy Progress Note  Dates of Reporting Period: 02/12/2018 to 03/20/2018  Objective Reports: See impression statement above  Objective Measurements: See above  Goal Update: STG assessment performed 2 visits ago; progressing towards LTG.  Plan: Continue POC and add OT   Reason Skilled Services are Required: to continue to address impairments in LE strength, ROM, balance, posture and gait to decrease burden of care and falls risk.  [name], PT, DPT 03/20/18    1:14 PM    [facility] [address] [place], [place], [address] Phone: [phone]   Fax:  [phone]  Name: [name] MRN: [id number] Date of Birth: Feb 22, 1941

## 2018-03-25 ENCOUNTER — Telehealth: Payer: Self-pay | Admitting: Physical Therapy

## 2018-03-25 ENCOUNTER — Ambulatory Visit: Payer: Medicare Other | Admitting: Physical Therapy

## 2018-03-25 ENCOUNTER — Encounter: Payer: Self-pay | Admitting: Physical Therapy

## 2018-03-25 DIAGNOSIS — R293 Abnormal posture: Secondary | ICD-10-CM

## 2018-03-25 DIAGNOSIS — R2681 Unsteadiness on feet: Secondary | ICD-10-CM | POA: Diagnosis not present

## 2018-03-25 DIAGNOSIS — Z9181 History of falling: Secondary | ICD-10-CM

## 2018-03-25 DIAGNOSIS — M6281 Muscle weakness (generalized): Secondary | ICD-10-CM

## 2018-03-25 DIAGNOSIS — R2689 Other abnormalities of gait and mobility: Secondary | ICD-10-CM

## 2018-03-25 NOTE — Patient Instructions (Signed)
Access Code: [email or web address]  URL: [email or web address]  Date: 03/25/2018  Prepared by: Misty Stanley   Exercises  Supine Bridge - 10 reps - 1 sets - 5 hold - 1x daily - 5x weekly  Hooklying Single Leg Bent Knee Fallouts with Resistance - 10 reps - 1 sets - 1x daily - 5x weekly  Seated Chair Push Ups - 10 reps - 1 sets - 3-5 hold - 1x daily - 5x weekly  Seated Knee Extension with Resistance - 10 reps - 1 sets - 1x daily - 5x weekly  Seated Hamstring Stretch with Chair - 10 reps - 6 second hold - 1x daily - 5x weekly  Modified Thomas Stretch - 4 reps - 30 second hold - 2x daily - 7x weekly

## 2018-03-25 NOTE — Therapy (Signed)
[facility] [address] [place], [place], [address] Phone: [phone]   Fax:  [phone]  Physical Therapy Treatment  Patient Details  Name: [name] MRN: [id number] Date of Birth: 25-Apr-1941 Referring Provider (PT): Larey Seat, MD   Encounter Date: 03/25/2018  PT End of Session - 03/25/18 1433    Visit Number  11    Number of Visits  26    Date for PT Re-Evaluation  05/10/18    Authorization Type  [employer] Pt has met $5900 oop, full coverage, 10th visit PN    PT Start Time  1108    PT Stop Time  1154    PT Time Calculation (min)  46 min    Equipment Utilized During Treatment  Gait belt    Activity Tolerance  Other (comment)   flexor tone   Behavior During Therapy  WFL for tasks assessed/performed       Past Medical History:  Diagnosis Date  . Arthritis   . Barrett esophagus   . Bronchitis   . CAD (coronary artery disease)    pt's wife denies any cardiac history on pt.  . Cancer ([place])    thyroid  . Carotid artery occlusion   . Chronic kidney disease    denies  . Constipation   . COPD (chronic obstructive pulmonary disease) ([place])   . Diabetes mellitus without complication ([place])   . Dyspnea   . GERD (gastroesophageal reflux disease)   . Headache    migraines  . History of thyroid cancer 2008  . History of thyroid cancer   . Hypertension   . OSA (obstructive sleep apnea)   . Pneumonia   . Restless leg   . S/P tendon repair    left thumb  . Sleep apnea with use of continuous positive airway pressure (CPAP)    [facility] , AHI  77cn central and obstrcutive. 16 cm water , 3 cm EPR.   . Thoracic ascending aortic aneurysm (HCC)    4.2 cm ascending TAA 08/18/17 CTA. Annual imaging recommended.  . Thyroid disease   . Ulcer    Peptic ulcer disease    Past Surgical History:  Procedure Laterality Date  . ANTERIOR CERVICAL DECOMP/DISCECTOMY FUSION N/A 09/03/2017   Procedure: ACDF  - C3-C4 - C4-C5 - C5-C6;  Surgeon: Kary Kos, MD;  Location: [facility];  Service: Neurosurgery;  Laterality: N/A;  . CAROTID ENDARTERECTOMY Left January 03, 2006   Dr. Amedeo Plenty  . COLONOSCOPY    . EYE SURGERY Bilateral    cataract surgery with lens implants  . game keepers thumb Right   . JOINT REPLACEMENT Left    knee X 2  . ROTATOR CUFF REPAIR Right   . THYROIDECTOMY  2008    There were no vitals filed for this visit.  Subjective Assessment - 03/25/18 1112    Subjective  Air fixed on Friday afternoon.  Went out to eat this weekend for son's birthday.  Has been trying to transfer more to/from toilet for brief changes instead of bed so he gets more WB through UE and LE.    Patient is accompained by:  Family member   spouse   Pertinent History  CAD, progressive muscular dystrophy, DM2, COPD, hypoxic respiratory failure, HTN,    Patient Stated Goals  To walk in home & community, wife wants him to be able to care for himself if something happens to her    Currently in Pain?  No/denies    Pain Onset  1 to 4 weeks ago                       [facility] Adult PT Treatment/Exercise - 03/25/18 1422      Transfers   Transfers  Sit to Stand;Stand to Sit    Sit to Stand  3: Mod assist    Sit to Stand Details (indicate cue type and reason)  first attempt to stand from w/c with UE support on RW but pt unable to fully extend LE.  Transitioned to // bars and performed sit <> stand multiple times with various AFO.  Still requires verbal cues for upright head and trunk during sit > stand to transition COG forwards over BOS, use of inhalation and tactile cues to R knee to activate hip and knee extensors.  Each time pt stood he demonstrated improved ability to activate LE extension with decreased assistance from therapist    Stand to Sit  4: Min assist      Ambulation/Gait   Ambulation/Gait  Yes    Ambulation/Gait Assistance  4: Min assist    Ambulation/Gait Assistance Details  in // bars with  trial of various anterior tibial AFO for DF assist and counteract LE hip and knee flexion.  First trial with [place] ground reaction AFO with pt able to ambulate 2-3 steps forwards but AFO did not demonstrate ability to affect knee flexion.  Transitioned to [facility] anterior tibia ground reaction AFO with pt demonstrating improved ability to ambulate in // bars x 4-5 feet x 2 reps with min A from therapist for upright trunk, weight shift and R knee stance control.       Ambulation Distance (Feet)  5 Feet   x 3 reps   Assistive device  Parallel bars    Gait Pattern  Step-to pattern;Step-through pattern;Decreased step length - left;Decreased stance time - right;Decreased stride length;Decreased weight shift to right;Right flexed knee in stance    Ambulation Surface  Level;Indoor      Exercises   Exercises  Knee/Hip      Knee/Hip Exercises: Stretches   Passive Hamstring Stretch  Right;Left;2 reps;60 seconds    Passive Hamstring Stretch Limitations  in sitting before performing standing in // bars    Hip Flexor Stretch Limitations  Prescribed hip flexor stretch but did not get to perform, will perform next session; hand out given             PT Education - 03/25/18 1431    Education Details  Limited progress with standing and gait due to increased flexor tone; importance of stretching 2x/day at home; discuss with Dr. Saintclair Halsted other medication options for hypertonicity management.  Removed Thursday appointment due to appointment with Dr. Gilles Chiquito) Educated  Patient;Spouse    Methods  Explanation    Comprehension  Verbalized understanding       PT Short Term Goals - 03/14/18 1251      PT SHORT TERM GOAL #1   Title  Patient & wife demonstrate & verbalize understanding of initial HEP. (All STGs Target Date: 03/15/2018)    Baseline  03/11/18: met with current HEP    Status  Achieved      PT SHORT TERM GOAL #2   Title  Patient performs scooting transfers right & left between level  surfaces with minimal guard.    Baseline  03/11/18: met today    Status  Achieved  PT SHORT TERM GOAL #3   Title  Patient sit to/from supine with patient 50% assist with LE movements.     Baseline  03/13/18: met today    Status  Achieved      PT SHORT TERM GOAL #4   Title  Sit to/from stand w/c to sink support, static 2 minutes & scans right/left, up/down with minimal guard.     Baseline  03/11/18: pt was able to stand for 2 minutes with scanning, continues to need up to min assist at times with transfers and standing balance.    Status  Partially Met      PT SHORT TERM GOAL #5   Title  Patient ambulates 5' with RW with moderate assist.     Baseline  03/13/18: met today with >20 feet with RW with min assist of 2     Status  Achieved        PT Long Term Goals - 02/13/18 0758      PT LONG TERM GOAL #1   Title  Patient & wife demonstrate & verbalize understanding of ongoing exercise / fitness plan including community programs. (All LTGs Target Date: 05/14/2018)    Time  3    Period  Months    Status  New    Target Date  05/14/18      PT LONG TERM GOAL #2   Title  w/c to bed (mat at similar ht to his bed) scooting or squat pivot transfer modified independent.    Time  3    Period  Months    Status  New    Target Date  05/14/18      PT LONG TERM GOAL #3   Title  Sit to/from supine modified independent.    Time  3    Period  Months    Status  New    Target Date  05/14/18      PT LONG TERM GOAL #4   Title  Sit to/from stand chairs with armrests to RW with supervision.    Time  3    Period  Months    Status  New    Target Date  05/14/18      PT LONG TERM GOAL #5   Title  Patient standing balance with RW with BUE support staticly 2 minutes, scans environment and reaches within length of arm for objects with supervision.     Time  3    Period  Months    Status  New    Target Date  05/14/18      Additional Long Term Goals   Additional Long Term Goals  Yes      PT  LONG TERM GOAL #6   Title  Patient ambulates 69' with RW with minA.     Time  3    Period  Months    Status  New    Target Date  05/14/18            Plan - 03/25/18 1433    Clinical Impression Statement  Trial of various AFO to determine if it would allow pt to have sufficient foot clearance and knee control to transfer with RW stand pivot and perform ambulation with RW.  Due to increased flexor tone pt continues to require // bars for safe standing and gait, even with AFO.  Pt to discuss tone management with physician on Thursday.  Therapist will also send physician a message about spasticity and limited progress.  Will  continue to assess for most appropriate AFO and will continue to progress as pt is able to tolerate.    Clinical Impairments Affecting Rehab Potential  weakness & ROM impaired 4 extremities & trunk; dependency transfers, bed mobility, standing & gait    PT Frequency  2x / week    PT Duration  Other (comment)   13 weeks, 90 days   PT Treatment/Interventions  ADLs/Self Care Home Management;Canalith Repostioning;Moist Heat;DME Instruction;Gait training;Stair training;Functional mobility training;Therapeutic activities;Therapeutic exercise;Balance training;Neuromuscular re-education;Patient/family education;Manual techniques;Passive range of motion;Vestibular    PT Next Visit Plan  Did Dr. Saintclair Halsted prescribe anything for tone?  Has he been doing stretches at home?  Demonstrate hip flexor stretch edge of mat!  Continue to work on standing with R AFO ([place] - pre-tibial AFO).  Gait in // bars, provide R knee stability    PT Home Exercise Plan  Access Code: [email or web address]     Consulted and Agree with Plan of Care  Patient;Family member/caregiver    Family Member Consulted  wife       Patient will benefit from skilled therapeutic intervention in order to improve the following deficits and impairments:  Abnormal gait, Cardiopulmonary status limiting activity, Decreased activity  tolerance, Decreased balance, Decreased endurance, Decreased knowledge of use of DME, Decreased mobility, Decreased range of motion, Decreased strength, Difficulty walking, Dizziness, Impaired flexibility, Impaired UE functional use, Postural dysfunction  Visit Diagnosis: Unsteadiness on feet  Muscle weakness (generalized)  History of falling  Abnormal posture  Other abnormalities of gait and mobility     Problem List Patient Active Problem List   Diagnosis Date Noted  . 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 Acute respiratory failure with hypoxemia ([place]) 08/18/2017  . Hypokalemia 08/18/2017  . Leukocytosis 08/18/2017  . Muscle atrophy of lower extremity 08/09/2017  . Bowel and bladder incontinence 08/09/2017  . Right-sided muscle weakness 08/09/2017  . Neuropathy 08/09/2017  . 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/2017  . Abnormality of gait 07/12/2017  . Myoclonic jerkings, 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/2017  . Acquired right foot drop 07/12/2017  . UTI (urinary tract infection) due to Enterococcus 07/12/2017  . Pressure injury of skin 06/21/2017  . Acute cystitis 06/20/2017  . Generalized weakness 06/20/2017  . Dehydration 06/20/2017  . Dyspnea 12/26/2016  . Chronic cough 12/26/2016  . Hypersomnia with sleep apnea 10/30/2016  . CSA (central sleep apnea) 10/30/2016  . Wheezing symptom 10/30/2016  . CPAP/BiPAP dependent 07/01/2015  . 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 RLS (restless legs syndrome) 08/25/2014  . Primary gout 08/25/2014  . OSA on CPAP 08/25/2014  . Severe obesity (BMI >= 40) ([place]) 08/25/2014  . Obesity (BMI 30-39.9) 02/18/2013  . Sleep apnea with use of continuous positive airway pressure (CPAP)   . 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 S/P TKR (total knee replacement) 11/29/2012  . History of thyroid cancer     Rico Junker, PT, DPT 03/25/18    2:39 PM    [facility] [address] [place], [place], [address] Phone: [phone]   Fax:  [phone]  Name: ADRIAAN MALTESE MRN: [id number] Date of Birth: 02-Aug-1940

## 2018-03-25 NOTE — Telephone Encounter (Signed)
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  We are most limited by the increased flexor tone and spasticity in the RLE.  He has been started on a stretching program for home but can only ambulate 3-4 feet before the flexor tone kicks in.  His wife said he has been on muscle relaxer medicine in the past but made him too sleepy.  I wanted to see what your thoughts were on other options for tone management; do you think he would be appropriate for oral baclofen?  I believe you have a f/u appointment with him on Thursday.  Please let me know if you have any thoughts or suggestions.  Thank you, Rico Junker, PT, DPT 03/25/18    1:46 PM

## 2018-03-26 ENCOUNTER — Ambulatory Visit: Payer: Medicare Other | Admitting: Occupational Therapy

## 2018-03-26 ENCOUNTER — Encounter: Payer: Self-pay | Admitting: Occupational Therapy

## 2018-03-26 ENCOUNTER — Other Ambulatory Visit: Payer: Self-pay

## 2018-03-26 DIAGNOSIS — R278 Other lack of coordination: Secondary | ICD-10-CM

## 2018-03-26 DIAGNOSIS — R29818 Other symptoms and signs involving the nervous system: Secondary | ICD-10-CM

## 2018-03-26 DIAGNOSIS — R29898 Other symptoms and signs involving the musculoskeletal system: Secondary | ICD-10-CM

## 2018-03-26 DIAGNOSIS — R269 Unspecified abnormalities of gait and mobility: Secondary | ICD-10-CM

## 2018-03-26 DIAGNOSIS — R293 Abnormal posture: Secondary | ICD-10-CM

## 2018-03-26 DIAGNOSIS — M6281 Muscle weakness (generalized): Secondary | ICD-10-CM

## 2018-03-26 DIAGNOSIS — M25612 Stiffness of left shoulder, not elsewhere classified: Secondary | ICD-10-CM

## 2018-03-26 DIAGNOSIS — R2681 Unsteadiness on feet: Secondary | ICD-10-CM | POA: Diagnosis not present

## 2018-03-26 NOTE — Therapy (Signed)
[facility] [address] [place], [place], [address] Phone: [phone]   Fax:  [phone]  Occupational Therapy Evaluation  Patient Details  Name: [name] MRN: [id number] Date of Birth: Jan 12, 1941 No data recorded  Encounter Date: 03/26/2018  OT End of Session - 03/26/18 1345    Visit Number  1    Number of Visits  16    Date for OT Re-Evaluation  05/21/18    Authorization Type  blue medicare will need PN every 10th visit    Authorization Time Period  90 days    Authorization - Visit Number  1    Authorization - Number of Visits  10    OT Start Time  1146    OT Stop Time  1235    OT Time Calculation (min)  49 min    Activity Tolerance  Patient tolerated treatment well       Past Medical History:  Diagnosis Date  . Arthritis   . Barrett esophagus   . Bronchitis   . CAD (coronary artery disease)    pt's wife denies any cardiac history on pt.  . Cancer ([place])    thyroid  . Carotid artery occlusion   . Chronic kidney disease    denies  . Constipation   . COPD (chronic obstructive pulmonary disease) ([place])   . Diabetes mellitus without complication ([facility])   . Dyspnea   . GERD (gastroesophageal reflux disease)   . Headache    migraines  . History of thyroid cancer 2008  . History of thyroid cancer   . Hypertension   . OSA (obstructive sleep apnea)   . Pneumonia   . Restless leg   . S/P tendon repair    left thumb  . Sleep apnea with use of continuous positive airway pressure (CPAP)    2011 [facility] , AHI  77cn central and obstrcutive. 16 cm water , 3 cm EPR.   . Thoracic ascending aortic aneurysm (HCC)    4.2 cm ascending TAA 08/18/17 CTA. Annual imaging recommended.  . Thyroid disease   . Ulcer    Peptic ulcer disease    Past Surgical History:  Procedure Laterality Date  . ANTERIOR CERVICAL DECOMP/DISCECTOMY FUSION N/A 09/03/2017   Procedure: ACDF - C3-C4 - C4-C5 - C5-C6;  Surgeon: Kary Kos, MD;  Location: [facility];  Service: Neurosurgery;  Laterality: N/A;  . CAROTID ENDARTERECTOMY Left January 03, 2006   Dr. Amedeo Plenty  . COLONOSCOPY    . EYE SURGERY Bilateral    cataract surgery with lens implants  . game keepers thumb Right   . JOINT REPLACEMENT Left    knee X 2  . ROTATOR CUFF REPAIR Right   . THYROIDECTOMY  2008    There were no vitals filed for this visit.  Subjective Assessment - 03/26/18 1148    Subjective   I can have pain in my back and in the back of my left upper arm (not shoulder).      Patient is accompained by:  Family member   wife   Pertinent History  ACDF C3- C6 09/03/2017. Pt began with LE weakness 05/2017.   Pt has been wheelchair bound starting in 06/2017. Pt in SNF for a month, then went home.  Pt then had ACDF C3- C6 09/03/2017 pt then d/c to SNF until 11/29/2017 for PT, OT and ST.  Pt discharged home with HHPT and OT which ended right before pt started with  PT here in this clinic.  PMH:    Currently in Pain?  No/denies        [facility] OT Assessment - 03/26/18 0001      Assessment   Medical Diagnosis  C3-C6 ACDF with myelopathy.    Onset Date/Surgical Date  09/03/17   onset of LE weakness 05/2017   Hand Dominance  Right      Precautions   Precautions  Fall      Restrictions   Weight Bearing Restrictions  No      Balance Screen   Has the patient fallen in the past 6 months  Yes   pt currently seeing PT    How many times?  2   fell asleep in w/c at SNF and fell out of chair     Home  Environment   Family/patient expects to be discharged to:  Private residence    Living Arrangements  Spouse/significant other    Available Help at Discharge  Available 24 hours/day   PCA 7-10 am and 7-10pm   Type of [place]  One level    Bathroom Shower/Tub  Shinglehouse  Standard   pt uses sliding board and is mod I   Additional Comments  Pt uses sliding board to transfer to large commode that he uses in the shower.         Prior Function   Level of Independence  Independent    Vocation  Retired    Leisure  yard Technical sales engineer      ADL   Eating/Feeding  Independent    Grooming  Minimal assistance   needs assistance to [employer] hair   Upper Body Bathing  Modified independent   LH sponge for back   [place] independent   [place] sponge for feet, adapted strategy for bottom   Upper Body Dressing  Minimal assistance   needs assistance with buttoning, zipping due to feeling   Lower Body Dressing  Maximal assistance   see comment below   Toilet Transfer  Modified independent    Toileting - Clothing Manipulation  Maximal assistance    Toileting -  Hygiene  Supervision/safety   caregiver checks behind pt   Tub/Shower Transfer  Modified independent    ADL comments  For LB dressing and clothing mgmt pt can push into a squat using handles on commode seat and caregiver hikes pants up and down      IADL   Shopping  Needs to be accompanied on any shopping trip;Assistance for transportation    Light Housekeeping  Does not participate in any housekeeping tasks    Meal Prep  Needs to have meals prepared and served   pt enjoyed cooking before   [employer] on family or friends for transportation    Medication Management  Is responsible for taking medication in correct dosages at correct time    Psychiatrist financial matters independently (budgets, writes checks, pays rent, bills goes to bank), collects and keeps track of income      Mobility   Mobility Status  Needs assist    Mobility Status Comments  in community pt is wheelchair bound       Written Expression   Dominant Hand  Right    Handwriting  100% legible   for name     Vision - History   Baseline Vision  Wears glasses only for  reading    Additional Comments  Pt denies any visual changes      Activity Tolerance   Activity Tolerance  Tolerate 30+ min activity without fatigue   can do  bathing/dressing but then needs signficant rest   Sitting Balance  Supports self with one extremity   will further assess functionally     Cognition   Overall Cognitive Status  Within Functional Limits for tasks assessed      Posture/Postural Control   Posture/Postural Control  Postural limitations    Postural Limitations  Rounded Shoulders;Forward head;Increased thoracic kyphosis;Decreased lumbar lordosis;Posterior pelvic tilt;Flexed trunk      Sensation   Light Touch  Impaired by gross assessment   worse in fingertips than hands   Hot/Cold  Impaired by gross assessment   pt experienced cool water as hot from waste up   Proprioception  Appears Intact   with testing/to be further assessed via fxl activity     Coordination   Gross Motor Movements are Fluid and Coordinated  No    Fine Motor Movements are Fluid and Coordinated  No    Other  Feel coordination is primarily impacted by sensation and poor proximal stability      Tone   Assessment Location  Right Upper Extremity;Left Upper Extremity      Hand Function   Right Hand Gross Grasp  Impaired    Right Hand Grip (lbs)  38    Left Hand Gross Grasp  Functional    Left Hand Grip (lbs)  45      RUE Tone   RUE Tone  Within Functional Limits      LUE Tone   LUE Tone  Mild;Hypertonic;Modified Ashworth      LUE Tone   Modified Ashworth Scale for Grading Hypertonia LUE  Slight increase in muscle tone, manifested by a catch, followed by minimal resistance throughout the remainder (less than half) of the ROM                        OT Short Term Goals - 03/26/18 1323      OT SHORT TERM GOAL #1   Title  Pt and wife will be mod I with UE home activities program focused on UE strenthening, coordination and UE functional use (BUE's) - 04/23/2018    Status  New      OT SHORT TERM GOAL #2   Title  Pt will demonstrate ability for active L shoulder flexion in supine to 125* without pain in prep for overhead functional  reach.     Status  New      OT SHORT TERM GOAL #3   Title  Pt will demonstrate ability to pick up light weight object with LUE at mid reach (75* of shoulder flexion).      Status  New      OT SHORT TERM GOAL #4   Title  Pt will demonstrate ability to use reacher to don pants over feet in sitting with min a.    Status  New      OT SHORT TERM GOAL #5   Title  Assess 9 hole peg and set goal prn    Status  New      OT SHORT TERM GOAL #6   Title  Pt will demonstate improved grip strength by at least 5 pounds total to assist with ADL tasks. (baseline L= 45, R =38)    Status  New  OT Long Term Goals - 03/26/18 1331      OT LONG TERM GOAL #1   Title  Pt and wife will be mod I with upgraded home activities program - 05/21/2018    Status  New      OT LONG TERM GOAL #2   Title  Pt will demonstrate ability to comb hair mod I    Status  New      OT LONG TERM GOAL #3   Title  Pt will be min a for simple hot familiar meal prep    Status  New      OT LONG TERM GOAL #4   Title  Pt will be mod I using reacher to don pants over feet.     Status  New      OT LONG TERM GOAL #5   Title  Pt will demonstrate improved grip strength by at least 7 pounds total to assist with simple cooking tasks (baseline L= 45, R = 38).    Status  New      Long Term Additional Goals   Additional Long Term Goals  Yes      OT LONG TERM GOAL #6   Title  Pt will verbalize understanding of adapted techniques for buttoning and zipping prn    Status  New      OT LONG TERM GOAL #7   Title  Pt will be able to lift light weight object at mid reach during simple cooking tasks with LUE    Status  New      OT LONG TERM GOAL #8   Title  Pt will be able to lift 2 pound object bilaterally from mid reach to assist with functional activities at home.     Status  New            Plan - 03/26/18 1336    Clinical Impression Statement  Pt is a 77 year old male s/p C3-C6 ACDF on 09/03/2017. Pt began experiencing  LE and UE weakness last year (12/18) and has been wheelchair bound since 06/2017.  Pt has had extensive therapy at SNF level as well as HHPT and OT.  Pt presents today with the following deficits that impact his independence:  decreased strength BUE's, decreased AROM LUE, decreased sensation UE's, decreased coordination BUE's, decreased functional use of UE's, pain at end range ROM BUE's. decreased ROM LUE, decreased sitting and standing balance, abnormal posture.  Pt will benefit from skilled OT to maximize independence and functional use of BUE's.     Occupational Profile and client history currently impacting functional performance  husband, friend, reitiree.  PMH:  CAD, DM, COPD, hypoxic respiratory failure, HTN    Occupational performance deficits (Please refer to evaluation for details):  ADL's;IADL's;Leisure;Social Participation;Rest and Sleep    Rehab Potential  Fair    Current Impairments/barriers affecting progress:  length of time since onset, severity of deficits    OT Frequency  2x / week    OT Duration  8 weeks    OT Treatment/Interventions  Self-care/ADL training;Moist Heat;Therapeutic exercise;Neuromuscular education;DME and/or AE instruction;Passive range of motion;Manual Therapy;Therapist, nutritional;Therapeutic activities;Patient/family education;Balance training    Plan  review goals and POC, manual therapy to address alignment of L shoulder girdle, NMR for UE/trunk to work toward improved functional use, sitting balance, UE strengthening, initiate HEP if possible    Clinical Decision Making  Multiple treatment options, significant modification of task necessary    Consulted and Agree with Plan of  Care  Patient;Family member/caregiver       Patient will benefit from skilled therapeutic intervention in order to improve the following deficits and impairments:  Decreased activity tolerance, Decreased balance, Decreased coordination, Decreased range of motion, Decreased mobility,  Decreased knowledge of use of DME, Decreased strength, Difficulty walking, Impaired UE functional use, Impaired sensation, Impaired flexibility, Pain  Visit Diagnosis: Muscle weakness (generalized) - Plan: Ot plan of care cert/re-cert  Other lack of coordination - Plan: Ot plan of care cert/re-cert  Other symptoms and signs involving the nervous system - Plan: Ot plan of care cert/re-cert  Abnormal posture - Plan: Ot plan of care cert/re-cert  Other symptoms and signs involving the musculoskeletal system - Plan: Ot plan of care cert/re-cert  Stiffness of left shoulder, not elsewhere classified - Plan: Ot plan of care cert/re-cert  Abnormality of gait and mobility - Plan: Ot plan of care cert/re-cert    Problem List Patient Active Problem List   Diagnosis Date Noted  . Myelopathy ([place]) 09/03/2017  . Acute respiratory failure with hypoxemia ([place]) 08/18/2017  . Hypokalemia 08/18/2017  . Leukocytosis 08/18/2017  . Muscle atrophy of lower extremity 08/09/2017  . Bowel and bladder incontinence 08/09/2017  . Right-sided muscle weakness 08/09/2017  . Neuropathy 08/09/2017  . 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/2017  . Abnormality of gait 07/12/2017  . Myoclonic jerkings, 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/2017  . Acquired right foot drop 07/12/2017  . UTI (urinary tract infection) due to Enterococcus 07/12/2017  . Pressure injury of skin 06/21/2017  . Acute cystitis 06/20/2017  . Generalized weakness 06/20/2017  . Dehydration 06/20/2017  . Dyspnea 12/26/2016  . Chronic cough 12/26/2016  . Hypersomnia with sleep apnea 10/30/2016  . CSA (central sleep apnea) 10/30/2016  . Wheezing symptom 10/30/2016  . CPAP/BiPAP dependent 07/01/2015  . 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 RLS (restless legs syndrome) 08/25/2014  . Primary gout 08/25/2014  . OSA on CPAP 08/25/2014  . Severe obesity (BMI >= 40) ([place]) 08/25/2014  . Obesity (BMI 30-39.9) 02/18/2013  . Sleep apnea with  use of continuous positive airway pressure (CPAP)   . 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 S/P TKR (total knee replacement) 11/29/2012  . History of thyroid cancer     Quay Burow, OTR/L 03/26/2018, 1:57 PM  [facility] [address] [place], [place], [address] Phone: [phone]   Fax:  [phone]  Name: PHENIX VANDERMEULEN MRN: [id number] Date of Birth: December 05, 1940

## 2018-03-28 ENCOUNTER — Ambulatory Visit: Payer: Medicare Other | Admitting: Physical Therapy

## 2018-04-01 ENCOUNTER — Ambulatory Visit: Payer: Medicare Other | Admitting: Physical Therapy

## 2018-04-01 DIAGNOSIS — R2689 Other abnormalities of gait and mobility: Secondary | ICD-10-CM

## 2018-04-01 DIAGNOSIS — M545 Low back pain, unspecified: Secondary | ICD-10-CM

## 2018-04-01 DIAGNOSIS — Z9181 History of falling: Secondary | ICD-10-CM

## 2018-04-01 DIAGNOSIS — M6281 Muscle weakness (generalized): Secondary | ICD-10-CM

## 2018-04-01 DIAGNOSIS — M6249 Contracture of muscle, multiple sites: Secondary | ICD-10-CM

## 2018-04-01 DIAGNOSIS — R2681 Unsteadiness on feet: Secondary | ICD-10-CM

## 2018-04-01 DIAGNOSIS — G8929 Other chronic pain: Secondary | ICD-10-CM

## 2018-04-01 NOTE — Patient Instructions (Signed)
Access Code: [email or web address]  URL: [email or web address]  Date: 04/01/2018  Prepared by: Misty Stanley   Exercises  Supine Bridge - 10 reps - 1 sets - 5 hold - 1x daily - 5x weekly  Hooklying Single Leg Bent Knee Fallouts with Resistance - 10 reps - 1 sets - 1x daily - 5x weekly  Seated Chair Push Ups - 10 reps - 1 sets - 3-5 hold - 1x daily - 5x weekly  Seated Knee Extension with Resistance - 10 reps - 1 sets - 1x daily - 5x weekly  Seated Hamstring Stretch with Chair - 10 reps - 6 second hold - 1x daily - 5x weekly  Prone Knee Flexion - 4 reps - 30 second hold - 1x daily - 7x weekly  Prone Gluteal Sets - 10 reps - 2 sets - 1x daily - 7x weekly  Supine Quadricep Sets - 10 reps - 2 sets - 1x daily - 7x weekly

## 2018-04-01 NOTE — Therapy (Signed)
[facility] [address] [place], [place], [address] Phone: [phone]   Fax:  [phone]  Physical Therapy Treatment  Patient Details  Name: [name] MRN: [id number] Date of Birth: 07-24-1940 Referring Provider (PT): Larey Seat, MD   Encounter Date: 04/01/2018  PT End of Session - 04/01/18 1255    Visit Number  12    Number of Visits  26    Date for PT Re-Evaluation  05/10/18    Authorization Type  [employer] Pt has met $5900 oop, full coverage, 10th visit PN    PT Start Time  1145    PT Stop Time  1230    PT Time Calculation (min)  45 min    Activity Tolerance  Other (comment)   flexor tone   Behavior During Therapy  [facility] for tasks assessed/performed       Past Medical History:  Diagnosis Date  . Arthritis   . Barrett esophagus   . Bronchitis   . CAD (coronary artery disease)    pt's wife denies any cardiac history on pt.  . Cancer ([place])    thyroid  . Carotid artery occlusion   . Chronic kidney disease    denies  . Constipation   . COPD (chronic obstructive pulmonary disease) ([place])   . Diabetes mellitus without complication ([place])   . Dyspnea   . GERD (gastroesophageal reflux disease)   . Headache    migraines  . History of thyroid cancer 2008  . History of thyroid cancer   . Hypertension   . OSA (obstructive sleep apnea)   . Pneumonia   . Restless leg   . S/P tendon repair    left thumb  . Sleep apnea with use of continuous positive airway pressure (CPAP)    [facility] , AHI  77cn central and obstrcutive. 16 cm water , 3 cm EPR.   . Thoracic ascending aortic aneurysm (HCC)    4.2 cm ascending TAA 08/18/17 CTA. Annual imaging recommended.  . Thyroid disease   . Ulcer    Peptic ulcer disease    Past Surgical History:  Procedure Laterality Date  . ANTERIOR CERVICAL DECOMP/DISCECTOMY FUSION N/A 09/03/2017   Procedure: ACDF - C3-C4 - C4-C5 - C5-C6;  Surgeon: Kary Kos, MD;   Location: [facility];  Service: Neurosurgery;  Laterality: N/A;  . CAROTID ENDARTERECTOMY Left January 03, 2006   Dr. Amedeo Plenty  . COLONOSCOPY    . EYE SURGERY Bilateral    cataract surgery with lens implants  . game keepers thumb Right   . JOINT REPLACEMENT Left    knee X 2  . ROTATOR CUFF REPAIR Right   . THYROIDECTOMY  2008    There were no vitals filed for this visit.  Subjective Assessment - 04/01/18 1151    Subjective  OT evaluation went well; gave him some things to start working on.  Saw surgeon who D/C patient but did give him a prescription for Baclofen BID.  Noticed decreased spasticity in RLE this morning and greater ease with stretching BUT did notice LLE spasms today that he has not had in the past.    Patient is accompained by:  Family member   spouse   Pertinent History  CAD, progressive muscular dystrophy, DM2, COPD, hypoxic respiratory failure, HTN,    Patient Stated Goals  To walk in home & community, wife wants him to be able to care for himself if something happens to her  Currently in Pain?  No/denies    Pain Onset  1 to 4 weeks ago                       [facility] Adult PT Treatment/Exercise - 04/01/18 1249      Bed Mobility   Bed Mobility  Rolling Right;Rolling Left;Supine to Sit;Sit to Supine    Rolling Right  Minimal Assistance - Patient > 75%    Rolling Left  Minimal Assistance - Patient > 75%    Supine to Sit  Moderate Assistance - Patient 50-74%    Sit to Supine  Moderate Assistance - Patient 50-74%;Minimal Assistance - Patient > 75%      Transfers   Transfers  Squat Pivot Transfers    Squat Pivot Transfers  4: Min assist;With upper extremity assistance    Squat Pivot Transfer Details (indicate cue type and reason)  from transport w/c <> mat with arm rest of chair beside mat to simulate bar pt holds when transferring into/out of bed at home      Knee/Hip Exercises: Stretches   Passive Hamstring Stretch  Right;3 reps;30 seconds   supine with  quad sets   Passive Hamstring Stretch Limitations  with quad sets x 6 seconds    Hip Flexor Stretch Limitations  attempted hip flexor stretch reclined on wedge and LE off edge of mat but unable to get adequate stretch of hip flexor and unable to tolerate lying flat with LE off edge due to back pain; transitioned to prone for hip flexor and quad stretches; added prone glute sets        Access Code: [email or web address]  URL: [email or web address]  Date: 04/01/2018  Prepared by: Misty Stanley   Exercises  Supine Bridge - 10 reps - 1 sets - 5 hold - 1x daily - 5x weekly  Hooklying Single Leg Bent Knee Fallouts with Resistance - 10 reps - 1 sets - 1x daily - 5x weekly  Seated Chair Push Ups - 10 reps - 1 sets - 3-5 hold - 1x daily - 5x weekly  Seated Knee Extension with Resistance - 10 reps - 1 sets - 1x daily - 5x weekly  Seated Hamstring Stretch with Chair - 10 reps - 6 second hold - 1x daily - 5x weekly  Prone Knee Flexion - 4 reps - 30 second hold - 1x daily - 7x weekly  Prone Gluteal Sets - 10 reps - 2 sets - 1x daily - 7x weekly  Supine Quadricep Sets - 10 reps - 2 sets - 1x daily - 7x weekly     PT Education - 04/01/18 1255    Education Details  adjusted stretching program    Person(s) Educated  Patient;Spouse    Methods  Explanation;Demonstration;Handout    Comprehension  Verbalized understanding;Returned demonstration       PT Short Term Goals - 03/14/18 1251      PT SHORT TERM GOAL #1   Title  Patient & wife demonstrate & verbalize understanding of initial HEP. (All STGs Target Date: 03/15/2018)    Baseline  03/11/18: met with current HEP    Status  Achieved      PT SHORT TERM GOAL #2   Title  Patient performs scooting transfers right & left between level surfaces with minimal guard.    Baseline  03/11/18: met today    Status  Achieved      PT SHORT TERM GOAL #3   Title  Patient sit to/from supine with  patient 50% assist with LE movements.     Baseline  03/13/18: met  today    Status  Achieved      PT SHORT TERM GOAL #4   Title  Sit to/from stand w/c to sink support, static 2 minutes & scans right/left, up/down with minimal guard.     Baseline  03/11/18: pt was able to stand for 2 minutes with scanning, continues to need up to min assist at times with transfers and standing balance.    Status  Partially Met      PT SHORT TERM GOAL #5   Title  Patient ambulates 30' with RW with moderate assist.     Baseline  03/13/18: met today with >20 feet with RW with min assist of 2     Status  Achieved        PT Long Term Goals - 02/13/18 0758      PT LONG TERM GOAL #1   Title  Patient & wife demonstrate & verbalize understanding of ongoing exercise / fitness plan including community programs. (All LTGs Target Date: 05/14/2018)    Time  3    Period  Months    Status  New    Target Date  05/14/18      PT LONG TERM GOAL #2   Title  w/c to bed (mat at similar ht to his bed) scooting or squat pivot transfer modified independent.    Time  3    Period  Months    Status  New    Target Date  05/14/18      PT LONG TERM GOAL #3   Title  Sit to/from supine modified independent.    Time  3    Period  Months    Status  New    Target Date  05/14/18      PT LONG TERM GOAL #4   Title  Sit to/from stand chairs with armrests to RW with supervision.    Time  3    Period  Months    Status  New    Target Date  05/14/18      PT LONG TERM GOAL #5   Title  Patient standing balance with RW with BUE support staticly 2 minutes, scans environment and reaches within length of arm for objects with supervision.     Time  3    Period  Months    Status  New    Target Date  05/14/18      Additional Long Term Goals   Additional Long Term Goals  Yes      PT LONG TERM GOAL #6   Title  Patient ambulates 47' with RW with minA.     Time  3    Period  Months    Status  New    Target Date  05/14/18            Plan - 04/01/18 1255    Clinical Impression Statement   Treatment session focused on review of stretching and adjustment of stretching program at home to improve safety and efficiency.  Pt able to transition to and tolerate prone position; changed hip flexor stretching and gluteal strengthening to prone and also added supine HS stretch with quad sets.  Pt tolerated well but did not get to assess effectiveness of stretching for improving LE extension in standing; will have pt and wife continue stretching at home and will reassess effect at next visit.    Clinical Impairments Affecting  Rehab Potential  weakness & ROM impaired 4 extremities & trunk; dependency transfers, bed mobility, standing & gait    PT Frequency  2x / week    PT Duration  Other (comment)   13 weeks, 90 days   PT Treatment/Interventions  ADLs/Self Care Home Management;Canalith Repostioning;Moist Heat;DME Instruction;Gait training;Stair training;Functional mobility training;Therapeutic activities;Therapeutic exercise;Balance training;Neuromuscular re-education;Patient/family education;Manual techniques;Passive range of motion;Vestibular    PT Next Visit Plan  Continue to work on standing with R AFO ([place] - pre-tibial AFO).  Gait in // bars, provide R knee stability    PT Home Exercise Plan  Access Code: [email or web address]     Consulted and Agree with Plan of Care  Patient;Family member/caregiver    Family Member Consulted  wife       Patient will benefit from skilled therapeutic intervention in order to improve the following deficits and impairments:  Abnormal gait, Cardiopulmonary status limiting activity, Decreased activity tolerance, Decreased balance, Decreased endurance, Decreased knowledge of use of DME, Decreased mobility, Decreased range of motion, Decreased strength, Difficulty walking, Dizziness, Impaired flexibility, Impaired UE functional use, Postural dysfunction  Visit Diagnosis: Chronic midline low back pain without sciatica  History of falling  Other abnormalities of gait and  mobility  Contracture of muscle, multiple sites  Unsteadiness on feet  Muscle weakness (generalized)     Problem List Patient Active Problem List   Diagnosis Date Noted  . 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 Acute respiratory failure with hypoxemia ([place]) 08/18/2017  . Hypokalemia 08/18/2017  . Leukocytosis 08/18/2017  . Muscle atrophy of lower extremity 08/09/2017  . Bowel and bladder incontinence 08/09/2017  . Right-sided muscle weakness 08/09/2017  . Neuropathy 08/09/2017  . 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/2017  . Abnormality of gait 07/12/2017  . Myoclonic jerkings, 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/2017  . Acquired right foot drop 07/12/2017  . UTI (urinary tract infection) due to Enterococcus 07/12/2017  . Pressure injury of skin 06/21/2017  . Acute cystitis 06/20/2017  . Generalized weakness 06/20/2017  . Dehydration 06/20/2017  . Dyspnea 12/26/2016  . Chronic cough 12/26/2016  . Hypersomnia with sleep apnea 10/30/2016  . CSA (central sleep apnea) 10/30/2016  . Wheezing symptom 10/30/2016  . CPAP/BiPAP dependent 07/01/2015  . 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 RLS (restless legs syndrome) 08/25/2014  . Primary gout 08/25/2014  . OSA on CPAP 08/25/2014  . Severe obesity (BMI >= 40) ([place]) 08/25/2014  . Obesity (BMI 30-39.9) 02/18/2013  . Sleep apnea with use of continuous positive airway pressure (CPAP)   . 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 S/P TKR (total knee replacement) 11/29/2012  . History of thyroid cancer     Rico Junker, PT, DPT 04/01/18    1:03 PM    [facility] [address] [place], [place], [address] Phone: [phone]   Fax:  [phone]  Name: [name] MRN: [id number] Date of Birth: 19-Apr-1941

## 2018-04-03 ENCOUNTER — Ambulatory Visit: Payer: Medicare Other | Admitting: Physical Therapy

## 2018-04-03 ENCOUNTER — Encounter: Payer: Self-pay | Admitting: Physical Therapy

## 2018-04-03 ENCOUNTER — Encounter: Payer: Self-pay | Admitting: Occupational Therapy

## 2018-04-03 ENCOUNTER — Ambulatory Visit: Payer: Medicare Other | Admitting: Occupational Therapy

## 2018-04-03 DIAGNOSIS — R278 Other lack of coordination: Secondary | ICD-10-CM

## 2018-04-03 DIAGNOSIS — R29818 Other symptoms and signs involving the nervous system: Secondary | ICD-10-CM

## 2018-04-03 DIAGNOSIS — M25612 Stiffness of left shoulder, not elsewhere classified: Secondary | ICD-10-CM

## 2018-04-03 DIAGNOSIS — M6281 Muscle weakness (generalized): Secondary | ICD-10-CM

## 2018-04-03 DIAGNOSIS — R2681 Unsteadiness on feet: Secondary | ICD-10-CM | POA: Diagnosis not present

## 2018-04-03 DIAGNOSIS — R293 Abnormal posture: Secondary | ICD-10-CM

## 2018-04-03 DIAGNOSIS — R29898 Other symptoms and signs involving the musculoskeletal system: Secondary | ICD-10-CM

## 2018-04-03 DIAGNOSIS — R2689 Other abnormalities of gait and mobility: Secondary | ICD-10-CM

## 2018-04-03 NOTE — Therapy (Signed)
[facility] [address] [place], [place], [address] Phone: [phone]   Fax:  [phone]  Occupational Therapy Treatment  Patient Details  Name: [name] MRN: [id number] Date of Birth: June 14, 1941 No data recorded  Encounter Date: 04/03/2018  OT End of Session - 04/03/18 1647    Visit Number  2    Number of Visits  16    Date for OT Re-Evaluation  05/21/18    Authorization Type  blue medicare will need PN every 10th visit    Authorization Time Period  90 days    Authorization - Visit Number  2    Authorization - Number of Visits  10    OT Start Time  1530    OT Stop Time  1620    OT Time Calculation (min)  50 min    Activity Tolerance  Patient limited by pain   Pain in back with transition to supine - due to increased LE tightness / spasms   Behavior During Therapy  [facility] for tasks assessed/performed       Past Medical History:  Diagnosis Date  . Arthritis   . Barrett esophagus   . Bronchitis   . CAD (coronary artery disease)    pt's wife denies any cardiac history on pt.  . Cancer ([place])    thyroid  . Carotid artery occlusion   . Chronic kidney disease    denies  . Constipation   . COPD (chronic obstructive pulmonary disease) ([place])   . Diabetes mellitus without complication ([place])   . Dyspnea   . GERD (gastroesophageal reflux disease)   . Headache    migraines  . History of thyroid cancer 2008  . History of thyroid cancer   . Hypertension   . OSA (obstructive sleep apnea)   . Pneumonia   . Restless leg   . S/P tendon repair    left thumb  . Sleep apnea with use of continuous positive airway pressure (CPAP)    [facility] , AHI  77cn central and obstrcutive. 16 cm water , 3 cm EPR.   . Thoracic ascending aortic aneurysm (HCC)    4.2 cm ascending TAA 08/18/17 CTA. Annual imaging recommended.  . Thyroid disease   . Ulcer    Peptic ulcer disease    Past Surgical History:  Procedure  Laterality Date  . ANTERIOR CERVICAL DECOMP/DISCECTOMY FUSION N/A 09/03/2017   Procedure: ACDF - C3-C4 - C4-C5 - C5-C6;  Surgeon: Kary Kos, MD;  Location: [facility];  Service: Neurosurgery;  Laterality: N/A;  . CAROTID ENDARTERECTOMY Left January 03, 2006   Dr. Amedeo Plenty  . COLONOSCOPY    . EYE SURGERY Bilateral    cataract surgery with lens implants  . game keepers thumb Right   . JOINT REPLACEMENT Left    knee X 2  . ROTATOR CUFF REPAIR Right   . THYROIDECTOMY  2008    There were no vitals filed for this visit.  Subjective Assessment - 04/03/18 1536    Subjective   I started the Baclofen - it seems to be helping    Patient is accompained by:  Family member    Pertinent History  ACDF C3- C6 09/03/2017. Pt began with LE weakness 05/2017.   Pt has been wheelchair bound starting in 06/2017. Pt in SNF for a month, then went home.  Pt then had ACDF C3- C6 09/03/2017 pt then d/c to SNF until 11/29/2017 for PT, OT and ST.  Pt discharged home with HHPT and OT which ended right before pt started with PT here in this clinic.  PMH:    Currently in Pain?  No/denies    Pain Score  0-No pain         [facility] OT Assessment - 04/03/18 0001      Coordination   9 Hole Peg Test  Right;Left    Right 9 Hole Peg Test  1.19    Left 9 Hole Peg Test  58               OT Treatments/Exercises (OP) - 04/03/18 0001      ADLs   LB Dressing  Reviewed use of button hook, and let patient take clinic button hook to trial at home.  Patient will return next visit.  Discussed use of ring to aide ability to zipper pants.      Functional Mobility  Patient able to squat pivot transfer level surface from transport chair to mat table.  Patient indicates lying down is not difficult, but getting back up is"  Patient needed assistance to lift legs onto surface, and had little to no control to lower the top half of his body to the mat table.  Needed max assist to transition back up to sitting.      ADL Comments  Reviewed short  and long term goals with patient and his wife.  Both are in agreement.        Neurological Re-education Exercises   Other Exercises 1  Supine - bilateral ball exercise to address shoulder and elbow coordiantion - chest press.  Patient with good shoulder motion, but had difficulty maintaining elbow extension without cueing.  Patient fatigued after 5 repetitions.               OT Education - 04/03/18 1647    Education Details  OT goals and plan of care; use of button hook, zipper ring    Person(s) Educated  Patient;Spouse    Methods  Explanation;Demonstration    Comprehension  Verbalized understanding       OT Short Term Goals - 04/03/18 1653      OT SHORT TERM GOAL #1   Title  Pt and wife will be mod I with UE home activities program focused on UE strenthening, coordination and UE functional use (BUE's) - 04/23/2018    Status  On-going      OT SHORT TERM GOAL #2   Title  Pt will demonstrate ability for active L shoulder flexion in supine to 125* without pain in prep for overhead functional reach.     Status  On-going      OT SHORT TERM GOAL #3   Title  Pt will demonstrate ability to pick up light weight object with LUE at mid reach (75* of shoulder flexion).      Status  On-going      OT SHORT TERM GOAL #4   Title  Pt will demonstrate ability to use reacher to don pants over feet in sitting with min a.    Status  On-going      OT SHORT TERM GOAL #5   Title  Patient will demonstrate improved coordination as evidenced by reduced time on 9 hole peg test by 10 seconds each hand - to improve ability to manipulate small objects - pills, buttons, etc.      Baseline  right 1 min 19 sec (10/16)   left 58 sec (10/16)    Status  New      OT SHORT TERM GOAL #6   Title  Pt will demonstate improved grip strength by at least 5 pounds total to assist with ADL tasks. (baseline L= 45, R =38)    Status  On-going        OT Long Term Goals - 04/03/18 1657      OT LONG TERM GOAL #1    Title  Pt and wife will be mod I with upgraded home activities program - 05/21/2018    Status  On-going      OT LONG TERM GOAL #2   Title  Pt will demonstrate ability to comb hair mod I    Status  On-going      OT LONG TERM GOAL #3   Title  Pt will be min a for simple hot familiar meal prep    Status  On-going      OT LONG TERM GOAL #4   Title  Pt will be mod I using reacher to don pants over feet.     Status  On-going      OT LONG TERM GOAL #5   Title  Pt will demonstrate improved grip strength by at least 7 pounds total to assist with simple cooking tasks (baseline L= 45, R = 38).    Status  On-going      OT LONG TERM GOAL #6   Title  Pt will verbalize understanding of adapted techniques for buttoning and zipping prn    Status  On-going      OT LONG TERM GOAL #7   Title  Pt will be able to lift light weight object at mid reach during simple cooking tasks with LUE    Status  On-going      OT LONG TERM GOAL #8   Title  Pt will be able to lift 2 pound object bilaterally from mid reach to assist with functional activities at home.     Status  On-going            Plan - 04/03/18 1651    Clinical Impression Statement  Patient and his wife are eager for greater independence with ADL, and for him to return to activities that he enjoyed previously (cooking and grilling)  Patient in agreement with OT goals and plan of care.      Occupational Profile and client history currently impacting functional performance  husband, friend, reitiree.  PMH:  CAD, DM, COPD, hypoxic respiratory failure, HTN    Occupational performance deficits (Please refer to evaluation for details):  ADL's;IADL's;Leisure;Social Participation;Rest and Sleep    Rehab Potential  Fair    Current Impairments/barriers affecting progress:  length of time since onset, severity of deficits    OT Frequency  2x / week    OT Duration  8 weeks    OT Treatment/Interventions  Self-care/ADL training;Moist Heat;Therapeutic  exercise;Neuromuscular education;DME and/or AE instruction;Passive range of motion;Manual Therapy;Therapist, nutritional;Therapeutic activities;Patient/family education;Balance training    Plan  continue with HEP - only started with chest press with ball in supine, transition to / from supine, sitting balance, work toward low reach    Clinical Decision Making  Multiple treatment options, significant modification of task necessary    Consulted and Agree with Plan of Care  Patient;Family member/caregiver       Patient will benefit from skilled therapeutic intervention in order to improve the following deficits and impairments:  Decreased activity tolerance, Decreased balance, Decreased coordination, Decreased range of motion, Decreased mobility,  Decreased knowledge of use of DME, Decreased strength, Difficulty walking, Impaired UE functional use, Impaired sensation, Impaired flexibility, Pain  Visit Diagnosis: Muscle weakness (generalized)  Other lack of coordination  Other symptoms and signs involving the nervous system  Abnormal posture  Other symptoms and signs involving the musculoskeletal system  Stiffness of left shoulder, not elsewhere classified    Problem List Patient Active Problem List   Diagnosis Date Noted  . 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 Acute respiratory failure with hypoxemia ([place]) 08/18/2017  . Hypokalemia 08/18/2017  . Leukocytosis 08/18/2017  . Muscle atrophy of lower extremity 08/09/2017  . Bowel and bladder incontinence 08/09/2017  . Right-sided muscle weakness 08/09/2017  . Neuropathy 08/09/2017  . 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/2017  . Abnormality of gait 07/12/2017  . Myoclonic jerkings, 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/2017  . Acquired right foot drop 07/12/2017  . UTI (urinary tract infection) due to Enterococcus 07/12/2017  . Pressure injury of skin 06/21/2017  . Acute cystitis 06/20/2017  . Generalized weakness 06/20/2017  .  Dehydration 06/20/2017  . Dyspnea 12/26/2016  . Chronic cough 12/26/2016  . Hypersomnia with sleep apnea 10/30/2016  . CSA (central sleep apnea) 10/30/2016  . Wheezing symptom 10/30/2016  . CPAP/BiPAP dependent 07/01/2015  . 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 RLS (restless legs syndrome) 08/25/2014  . Primary gout 08/25/2014  . OSA on CPAP 08/25/2014  . Severe obesity (BMI >= 40) ([place]) 08/25/2014  . Obesity (BMI 30-39.9) 02/18/2013  . Sleep apnea with use of continuous positive airway pressure (CPAP)   . 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 S/P TKR (total knee replacement) 11/29/2012  . History of thyroid cancer     Mariah Milling, OTR/L 04/03/2018, 5:00 PM  [facility] [address] [place], [place], [address] Phone: [phone]   Fax:  [phone]  Name: YOUSOF ALDERMAN MRN: [id number] Date of Birth: July 05, 1940

## 2018-04-03 NOTE — Therapy (Signed)
[facility] [address] [place], [place], [address] Phone: [phone]   Fax:  [phone]  Physical Therapy Treatment  Patient Details  Name: [name] MRN: [id number] Date of Birth: 12/08/1940 Referring Provider (PT): Larey Seat, MD   Encounter Date: 04/03/2018  PT End of Session - 04/03/18 1303    Visit Number  13    Number of Visits  26    Date for PT Re-Evaluation  05/10/18    Authorization Type  [employer] Pt has met $5900 oop, full coverage, 10th visit PN    PT Start Time  1150    PT Stop Time  1235    PT Time Calculation (min)  45 min    Equipment Utilized During Treatment  Gait belt    Behavior During Therapy  WFL for tasks assessed/performed       Past Medical History:  Diagnosis Date  . Arthritis   . Barrett esophagus   . Bronchitis   . CAD (coronary artery disease)    pt's wife denies any cardiac history on pt.  . Cancer ([place])    thyroid  . Carotid artery occlusion   . Chronic kidney disease    denies  . Constipation   . COPD (chronic obstructive pulmonary disease) ([place])   . Diabetes mellitus without complication ([place])   . Dyspnea   . GERD (gastroesophageal reflux disease)   . Headache    migraines  . History of thyroid cancer 2008  . History of thyroid cancer   . Hypertension   . OSA (obstructive sleep apnea)   . Pneumonia   . Restless leg   . S/P tendon repair    left thumb  . Sleep apnea with use of continuous positive airway pressure (CPAP)    [facility] , AHI  77cn central and obstrcutive. 16 cm water , 3 cm EPR.   . Thoracic ascending aortic aneurysm (HCC)    4.2 cm ascending TAA 08/18/17 CTA. Annual imaging recommended.  . Thyroid disease   . Ulcer    Peptic ulcer disease    Past Surgical History:  Procedure Laterality Date  . ANTERIOR CERVICAL DECOMP/DISCECTOMY FUSION N/A 09/03/2017   Procedure: ACDF - C3-C4 - C4-C5 - C5-C6;  Surgeon: Kary Kos, MD;   Location: [facility];  Service: Neurosurgery;  Laterality: N/A;  . CAROTID ENDARTERECTOMY Left January 03, 2006   Dr. Amedeo Plenty  . COLONOSCOPY    . EYE SURGERY Bilateral    cataract surgery with lens implants  . game keepers thumb Right   . JOINT REPLACEMENT Left    knee X 2  . ROTATOR CUFF REPAIR Right   . THYROIDECTOMY  2008    There were no vitals filed for this visit.  Subjective Assessment - 04/03/18 1158    Subjective  Did not come for OT yesterday; wife passed out and caregiver had to stay all day.  Have not had a chance to try new stretches.  Pt is noticing a difference in frequency and intensity of spasms with Baclofen.    Patient is accompained by:  Family member   spouse   Pertinent History  CAD, progressive muscular dystrophy, DM2, COPD, hypoxic respiratory failure, HTN,    Patient Stated Goals  To walk in home & community, wife wants him to be able to care for himself if something happens to her    Currently in Pain?  No/denies    Pain Onset  1 to  4 weeks ago                       [facility] Adult PT Treatment/Exercise - 04/03/18 1249      Transfers   Transfers  Squat Pivot Transfers    Squat Pivot Transfers  3: Mod assist;With upper extremity assistance   uphill to [employer] Details (indicate cue type and reason)  w/c <> Nustep       Ambulation/Gait   Ambulation/Gait  Yes    Ambulation/Gait Assistance  3: Mod assist    Ambulation/Gait Assistance Details  with Up Walker and +2; one therapist to control speed of RW and other therapist providing facilitation and verbal cues for extension through trunk and UE for more upright trunk, cues for activation of LE extension during stance, to widen BOS and for increased step length    Ambulation Distance (Feet)  115 Feet   with two standing rest breaks   Assistive device  4-wheeled walker;Other (Comment)   UP Walker   Gait Pattern  Step-to pattern;Step-through pattern;Decreased step length -  left;Decreased stride length;Right flexed knee in stance;Decreased step length - right;Left flexed knee in stance;Ataxic;Scissoring;Trunk flexed;Narrow base of support;Poor foot clearance - right    Ambulation Surface  Level;Indoor    Stairs  Yes    Stairs Assistance  1: +2 Total assist    Stairs Assistance Details (indicate cue type and reason)  assistance from therapists to stabilize each knee during stance when ascending and descending and to shift weight forwards during descending; at bottom of stairs pt unable to remain standing to turn and required total A to pivot to sit in w/c.    Stair Management Technique  Two rails;Step to pattern;Forwards    Number of Stairs  4    Height of Stairs  6      Knee/Hip Exercises: Aerobic   Nustep  Level 3 x 6:30 with bilat UE and LE + 2 minutes with LE only to focus on endurance, coordination, mm ROM and extensor strengthening             PT Education - 04/03/18 1302    Education Details  gait and stair negotiation    Person(s) Educated  Patient    Methods  Explanation;Demonstration    Comprehension  Need further instruction       PT Short Term Goals - 04/03/18 1743      PT SHORT TERM GOAL #1   Title  now LTG - 04/13/2018        PT Long Term Goals - 04/03/18 1741      PT LONG TERM GOAL #1   Title  Patient & wife demonstrate & verbalize understanding of ongoing exercise / fitness plan including community programs. (All LTGs Target Date: 04/13/2018) (And then adjust goals for 00/17/49 end of cert date)    Time  3    Period  Months    Status  New    Target Date  04/13/18      PT LONG TERM GOAL #2   Title  w/c to bed (mat at similar ht to his bed) scooting or squat pivot transfer modified independent.    Time  3    Period  Months    Status  New    Target Date  04/13/18      PT LONG TERM GOAL #3   Title  Sit to/from supine modified independent.    Time  3  Period  Months    Status  New    Target Date  04/13/18      PT  LONG TERM GOAL #4   Title  Sit to/from stand chairs with armrests to RW with supervision.    Time  3    Period  Months    Status  New    Target Date  04/13/18      PT LONG TERM GOAL #5   Title  Patient standing balance with RW with BUE support staticly 2 minutes, scans environment and reaches within length of arm for objects with supervision.     Time  3    Period  Months    Status  New    Target Date  04/13/18      PT LONG TERM GOAL #6   Title  Patient ambulates 78' with RW with minA.     Time  3    Period  Months    Status  New    Target Date  04/13/18            Plan - 04/03/18 1304    Clinical Impression Statement  Treatment session focused on warming up on Nustep and then transitioning into gait training but with use of UP Walker to facilitate increased UE support and upright trunk; with UpWalker pt able to ambulate full distance around the gym with 2 standing rest breaks with no evidence of flexor tone/withdrawal.  Also performed stair negotiation to continue to focus on lateral weight shifting, extensor strengthening and foot clearance.  Pt tolerated with no flexor withdrawal but required +2 to pivot back to chair safely after descending.  Will continue to progress towards LTG.    Clinical Impairments Affecting Rehab Potential  weakness & ROM impaired 4 extremities & trunk; dependency transfers, bed mobility, standing & gait    PT Frequency  2x / week    PT Duration  Other (comment)   13 weeks, 90 days   PT Treatment/Interventions  ADLs/Self Care Home Management;Canalith Repostioning;Moist Heat;DME Instruction;Gait training;Stair training;Functional mobility training;Therapeutic activities;Therapeutic exercise;Balance training;Neuromuscular re-education;Patient/family education;Manual techniques;Passive range of motion;Vestibular    PT Next Visit Plan  Continue to work on standing with R AFO (Thuasane - pre-tibial AFO).  Gait with UpWalker after warming up with NuStep,  provide R knee stability    PT Home Exercise Plan  Access Code: [email or web address]     Consulted and Agree with Plan of Care  Patient;Family member/caregiver    Family Member Consulted  wife       Patient will benefit from skilled therapeutic intervention in order to improve the following deficits and impairments:  Abnormal gait, Cardiopulmonary status limiting activity, Decreased activity tolerance, Decreased balance, Decreased endurance, Decreased knowledge of use of DME, Decreased mobility, Decreased range of motion, Decreased strength, Difficulty walking, Dizziness, Impaired flexibility, Impaired UE functional use, Postural dysfunction  Visit Diagnosis: Abnormal posture  Unsteadiness on feet  Other abnormalities of gait and mobility     Problem List Patient Active Problem List   Diagnosis Date Noted  . 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 Acute respiratory failure with hypoxemia ([place]) 08/18/2017  . Hypokalemia 08/18/2017  . Leukocytosis 08/18/2017  . Muscle atrophy of lower extremity 08/09/2017  . Bowel and bladder incontinence 08/09/2017  . Right-sided muscle weakness 08/09/2017  . Neuropathy 08/09/2017  . 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/2017  . Abnormality of gait 07/12/2017  . Myoclonic jerkings, 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/2017  . Acquired right foot drop 07/12/2017  .  UTI (urinary tract infection) due to Enterococcus 07/12/2017  . Pressure injury of skin 06/21/2017  . Acute cystitis 06/20/2017  . Generalized weakness 06/20/2017  . Dehydration 06/20/2017  . Dyspnea 12/26/2016  . Chronic cough 12/26/2016  . Hypersomnia with sleep apnea 10/30/2016  . CSA (central sleep apnea) 10/30/2016  . Wheezing symptom 10/30/2016  . CPAP/BiPAP dependent 07/01/2015  . 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 RLS (restless legs syndrome) 08/25/2014  . Primary gout 08/25/2014  . OSA on CPAP 08/25/2014  . Severe obesity (BMI >= 40) ([place]) 08/25/2014  . Obesity (BMI 30-39.9)  02/18/2013  . Sleep apnea with use of continuous positive airway pressure (CPAP)   . 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 S/P TKR (total knee replacement) 11/29/2012  . History of thyroid cancer     Rico Junker, PT, DPT 04/03/18    5:45 PM    [facility] [address] [place], [place], [address] Phone: [phone]   Fax:  [phone]  Name: [name] MRN: [id number] Date of Birth: [birth year]

## 2018-04-08 ENCOUNTER — Encounter: Payer: Self-pay | Admitting: Occupational Therapy

## 2018-04-08 ENCOUNTER — Ambulatory Visit: Payer: Medicare Other | Admitting: Occupational Therapy

## 2018-04-08 ENCOUNTER — Ambulatory Visit: Payer: Medicare Other | Admitting: Physical Therapy

## 2018-04-08 ENCOUNTER — Encounter: Payer: Self-pay | Admitting: Physical Therapy

## 2018-04-08 DIAGNOSIS — R2681 Unsteadiness on feet: Secondary | ICD-10-CM

## 2018-04-08 DIAGNOSIS — R293 Abnormal posture: Secondary | ICD-10-CM

## 2018-04-08 DIAGNOSIS — R29898 Other symptoms and signs involving the musculoskeletal system: Secondary | ICD-10-CM

## 2018-04-08 DIAGNOSIS — R278 Other lack of coordination: Secondary | ICD-10-CM

## 2018-04-08 DIAGNOSIS — M25612 Stiffness of left shoulder, not elsewhere classified: Secondary | ICD-10-CM

## 2018-04-08 DIAGNOSIS — M6281 Muscle weakness (generalized): Secondary | ICD-10-CM

## 2018-04-08 DIAGNOSIS — R2689 Other abnormalities of gait and mobility: Secondary | ICD-10-CM

## 2018-04-08 DIAGNOSIS — R29818 Other symptoms and signs involving the nervous system: Secondary | ICD-10-CM

## 2018-04-08 NOTE — Therapy (Signed)
[facility] [address] [place], [place], [address] Phone: [phone]   Fax:  [phone]  Physical Therapy Treatment  Patient Details  Name: [name] MRN: [id number] Date of Birth: [birth year] Referring Provider (PT): Larey Seat, MD   Encounter Date: 04/08/2018  PT End of Session - 04/08/18 1643    Visit Number  14    Number of Visits  26    Date for PT Re-Evaluation  05/10/18    Authorization Type  [employer] Pt has met $5900 oop, full coverage, 10th visit PN    PT Start Time  1145    PT Stop Time  1230    PT Time Calculation (min)  45 min    Equipment Utilized During Treatment  Gait belt    Behavior During Therapy  WFL for tasks assessed/performed       Past Medical History:  Diagnosis Date  . Arthritis   . Barrett esophagus   . Bronchitis   . CAD (coronary artery disease)    pt's wife denies any cardiac history on pt.  . Cancer ([place])    thyroid  . Carotid artery occlusion   . Chronic kidney disease    denies  . Constipation   . COPD (chronic obstructive pulmonary disease) ([place])   . Diabetes mellitus without complication ([place])   . Dyspnea   . GERD (gastroesophageal reflux disease)   . Headache    migraines  . History of thyroid cancer 2008  . History of thyroid cancer   . Hypertension   . OSA (obstructive sleep apnea)   . Pneumonia   . Restless leg   . S/P tendon repair    left thumb  . Sleep apnea with use of continuous positive airway pressure (CPAP)    [facility] , AHI  77cn central and obstrcutive. 16 cm water , 3 cm EPR.   . Thoracic ascending aortic aneurysm (HCC)    4.2 cm ascending TAA 08/18/17 CTA. Annual imaging recommended.  . Thyroid disease   . Ulcer    Peptic ulcer disease    Past Surgical History:  Procedure Laterality Date  . ANTERIOR CERVICAL DECOMP/DISCECTOMY FUSION N/A 09/03/2017   Procedure: ACDF - C3-C4 - C4-C5 - C5-C6;  Surgeon: Kary Kos, MD;   Location: [facility];  Service: Neurosurgery;  Laterality: N/A;  . CAROTID ENDARTERECTOMY Left January 03, 2006   Dr. Amedeo Plenty  . COLONOSCOPY    . EYE SURGERY Bilateral    cataract surgery with lens implants  . game keepers thumb Right   . JOINT REPLACEMENT Left    knee X 2  . ROTATOR CUFF REPAIR Right   . THYROIDECTOMY  2008    There were no vitals filed for this visit.  Subjective Assessment - 04/08/18 1156    Patient is accompained by:  Family member   spouse   Pertinent History  CAD, progressive muscular dystrophy, DM2, COPD, hypoxic respiratory failure, HTN,    Patient Stated Goals  To walk in home & community, wife wants him to be able to care for himself if something happens to her    Pain Onset  1 to 4 weeks ago                       [facility] Adult PT Treatment/Exercise - 04/08/18 1204      Transfers   Transfers  Squat Pivot Transfers    Squat Pivot Transfers  3:  Mod assist;With upper extremity assistance    Squat Pivot Transfer Details (indicate cue type and reason)  w/c <> Nustep      Ambulation/Gait   Ambulation/Gait  Yes    Ambulation/Gait Assistance  2: Max assist    Ambulation/Gait Assistance Details  with UpWalker and trial of bilat ground reaction AFO for increased foot clearance and knee control.  Pt demonstrated increased trunk flexion, and LE flexion during ambulation today and required increased verbal and tactile cues for stepping sequence, LE activation and upright trunk.  Pt also required increased seated rest breaks.  Improved foot clearance with use of AFO.    Ambulation Distance (Feet)  50 Feet   x2   Assistive device  4-wheeled walker;Other (Comment)   Up Walker   Gait Pattern  Step-to pattern;Step-through pattern;Decreased step length - left;Decreased stride length;Right flexed knee in stance;Decreased step length - right;Left flexed knee in stance;Ataxic;Scissoring;Trunk flexed;Narrow base of support    Ambulation Surface  Level;Indoor       Therapeutic Activites    Therapeutic Activities  Other Therapeutic Activities    Other Therapeutic Activities  educated on ground reaction AFO purpose and other options for bracing.      Knee/Hip Exercises: Aerobic   Nustep  L3 x 6 minutes with bilat UE and LE to increase joint AROM and extensor activation before ambulating             PT Education - 04/08/18 1642    Education Details  AFO options    Person(s) Educated  Patient;Spouse    Methods  Explanation    Comprehension  Verbalized understanding       PT Short Term Goals - 04/03/18 1743      PT SHORT TERM GOAL #1   Title  now LTG - 04/13/2018        PT Long Term Goals - 04/03/18 1741      PT LONG TERM GOAL #1   Title  Patient & wife demonstrate & verbalize understanding of ongoing exercise / fitness plan including community programs. (All LTGs Target Date: 04/13/2018) (And then adjust goals for 12/87/86 end of cert date)    Time  3    Period  Months    Status  New    Target Date  04/13/18      PT LONG TERM GOAL #2   Title  w/c to bed (mat at similar ht to his bed) scooting or squat pivot transfer modified independent.    Time  3    Period  Months    Status  New    Target Date  04/13/18      PT LONG TERM GOAL #3   Title  Sit to/from supine modified independent.    Time  3    Period  Months    Status  New    Target Date  04/13/18      PT LONG TERM GOAL #4   Title  Sit to/from stand chairs with armrests to RW with supervision.    Time  3    Period  Months    Status  New    Target Date  04/13/18      PT LONG TERM GOAL #5   Title  Patient standing balance with RW with BUE support staticly 2 minutes, scans environment and reaches within length of arm for objects with supervision.     Time  3    Period  Months    Status  New  Target Date  04/13/18      PT LONG TERM GOAL #6   Title  Patient ambulates 37' with RW with minA.     Time  3    Period  Months    Status  New    Target Date  04/13/18             Plan - 04/08/18 1643    Clinical Impression Statement  Treatment session focused on continued LE coordination, ROM and strengthening with Nustep before transitioning to UpWalker for gait training with trial of bilat ground reaction AFO to determine if pt would benefit from bilat AFO to improve safety and efficiency of standing and gait training.  With ground reaction AFO pt demonstrated improved foot clearance but decreased ability to activation trunk, hip and knee extensors in stance phase.  Will continue to assess most appropriate bracing for patient and request order for assessment with orthotist when appropriate.     Clinical Impairments Affecting Rehab Potential  weakness & ROM impaired 4 extremities & trunk; dependency transfers, bed mobility, standing & gait    PT Frequency  2x / week    PT Duration  Other (comment)   13 weeks, 90 days   PT Treatment/Interventions  ADLs/Self Care Home Management;Canalith Repostioning;Moist Heat;DME Instruction;Gait training;Stair training;Functional mobility training;Therapeutic activities;Therapeutic exercise;Balance training;Neuromuscular re-education;Patient/family education;Manual techniques;Passive range of motion;Vestibular    PT Next Visit Plan  Start assessing LTG (will need to revise LTG and date to 11/26).  Continue to work on standing and gait with bilat AFO (Thuasane - pre-tibial AFO).  Gait with UpWalker after warming up with NuStep, provide R knee stability    PT Home Exercise Plan  Access Code: [email or web address]     Consulted and Agree with Plan of Care  Patient;Family member/caregiver    Family Member Consulted  wife       Patient will benefit from skilled therapeutic intervention in order to improve the following deficits and impairments:  Abnormal gait, Cardiopulmonary status limiting activity, Decreased activity tolerance, Decreased balance, Decreased endurance, Decreased knowledge of use of DME, Decreased mobility, Decreased range of  motion, Decreased strength, Difficulty walking, Dizziness, Impaired flexibility, Impaired UE functional use, Postural dysfunction  Visit Diagnosis: Abnormal posture  Unsteadiness on feet  Other abnormalities of gait and mobility     Problem List Patient Active Problem List   Diagnosis Date Noted  . 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 Acute respiratory failure with hypoxemia ([place]) 08/18/2017  . Hypokalemia 08/18/2017  . Leukocytosis 08/18/2017  . Muscle atrophy of lower extremity 08/09/2017  . Bowel and bladder incontinence 08/09/2017  . Right-sided muscle weakness 08/09/2017  . Neuropathy 08/09/2017  . 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/2017  . Abnormality of gait 07/12/2017  . Myoclonic jerkings, 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/2017  . Acquired right foot drop 07/12/2017  . UTI (urinary tract infection) due to Enterococcus 07/12/2017  . Pressure injury of skin 06/21/2017  . Acute cystitis 06/20/2017  . Generalized weakness 06/20/2017  . Dehydration 06/20/2017  . Dyspnea 12/26/2016  . Chronic cough 12/26/2016  . Hypersomnia with sleep apnea 10/30/2016  . CSA (central sleep apnea) 10/30/2016  . Wheezing symptom 10/30/2016  . CPAP/BiPAP dependent 07/01/2015  . 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 RLS (restless legs syndrome) 08/25/2014  . Primary gout 08/25/2014  . OSA on CPAP 08/25/2014  . Severe obesity (BMI >= 40) ([place]) 08/25/2014  . Obesity (BMI 30-39.9) 02/18/2013  . Sleep apnea with use of continuous positive airway pressure (CPAP)   .  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 S/P TKR (total knee replacement) 11/29/2012  . History of thyroid cancer    Rico Junker, PT, DPT 04/08/18    4:54 PM    [facility] [address] [place], [place], [address] Phone: [phone]   Fax:  [phone]  Name: [name] MRN: [id number] Date of  Birth: [birth year]

## 2018-04-08 NOTE — Therapy (Signed)
[facility] [address] [place], [place], [address] Phone: [phone]   Fax:  [phone]  Occupational Therapy Treatment  Patient Details  Name: [name] MRN: [id number] Date of Birth: 07-07-1940 No data recorded  Encounter Date: 04/08/2018  OT End of Session - 04/08/18 1558    Visit Number  3    Number of Visits  16    Date for OT Re-Evaluation  05/21/18    Authorization Type  [employer] medicare will need PN every 10th visit    Authorization Time Period  90 days    Authorization - Visit Number  3    Authorization - Number of Visits  10    OT Start Time  1232    OT Stop Time  1314    OT Time Calculation (min)  42 min    Activity Tolerance  Patient tolerated treatment well       Past Medical History:  Diagnosis Date  . Arthritis   . Barrett esophagus   . Bronchitis   . CAD (coronary artery disease)    pt's wife denies any cardiac history on pt.  . Cancer ([place])    thyroid  . Carotid artery occlusion   . Chronic kidney disease    denies  . Constipation   . COPD (chronic obstructive pulmonary disease) ([place])   . Diabetes mellitus without complication ([place])   . Dyspnea   . GERD (gastroesophageal reflux disease)   . Headache    migraines  . History of thyroid cancer 2008  . History of thyroid cancer   . Hypertension   . OSA (obstructive sleep apnea)   . Pneumonia   . Restless leg   . S/P tendon repair    left thumb  . Sleep apnea with use of continuous positive airway pressure (CPAP)    2011 [facility] , AHI  77cn central and obstrcutive. 16 cm water , 3 cm EPR.   . Thoracic ascending aortic aneurysm (HCC)    4.2 cm ascending TAA 08/18/17 CTA. Annual imaging recommended.  . Thyroid disease   . Ulcer    Peptic ulcer disease    Past Surgical History:  Procedure Laterality Date  . ANTERIOR CERVICAL DECOMP/DISCECTOMY FUSION N/A 09/03/2017   Procedure: ACDF - C3-C4 - C4-C5 - C5-C6;  Surgeon: Kary Kos, MD;  Location: [facility];  Service: Neurosurgery;  Laterality: N/A;  . CAROTID ENDARTERECTOMY Left January 03, 2006   Dr. Amedeo Plenty  . COLONOSCOPY    . EYE SURGERY Bilateral    cataract surgery with lens implants  . game keepers thumb Right   . JOINT REPLACEMENT Left    knee X 2  . ROTATOR CUFF REPAIR Right   . THYROIDECTOMY  2008    There were no vitals filed for this visit.  Subjective Assessment - 04/08/18 1239    Subjective   I think the arm is hurting because last night I tried to push myself up in bed using my left arm    Patient is accompained by:  Family member   wife   Pertinent History  ACDF C3- C6 09/03/2017. Pt began with LE weakness 05/2017.   Pt has been wheelchair bound starting in 06/2017. Pt in SNF for a month, then went home.  Pt then had ACDF C3- C6 09/03/2017 pt then d/c to SNF until 11/29/2017 for PT, OT and ST.  Pt discharged home with HHPT and OT which ended right before pt started  with PT here in this clinic.  PMH:    Currently in Pain?  Yes    Pain Score  8     Pain Location  Arm    Pain Orientation  Left    Pain Descriptors / Indicators  Sharp;Stabbing    Pain Type  Chronic pain    Pain Onset  More than a month ago    Pain Frequency  Intermittent    Aggravating Factors   certain movements - and I don't have it every day    Pain Relieving Factors  tylenol, repositionng the arm.                   OT Treatments/Exercises (OP) - 04/08/18 0001      Neurological Re-education Exercises   Other Exercises 1  Therapeutic activities to address grip and pinch strength - issued red putty for right hand and green putty for left hand for HEP.  Also issued coordinatio program for HEP.  Pt required instruction, cues and practice of each activity but then was able to return demonstrate mod I.  Wife also observed and can cue prn.         Pt education completed for grip and pinch strength as well as coordination through demonstration, vc's and return demonstration.  Hand out provided and pt able to return demonstrate mod I.  Wife observed and is able to cue prn        OT Short Term Goals - 04/03/18 1653      OT SHORT TERM GOAL #1   Title  Pt and wife will be mod I with UE home activities program focused on UE strenthening, coordination and UE functional use (BUE's) - 04/23/2018    Status  On-going      OT SHORT TERM GOAL #2   Title  Pt will demonstrate ability for active L shoulder flexion in supine to 125* without pain in prep for overhead functional reach.     Status  On-going      OT SHORT TERM GOAL #3   Title  Pt will demonstrate ability to pick up light weight object with LUE at mid reach (75* of shoulder flexion).      Status  On-going      OT SHORT TERM GOAL #4   Title  Pt will demonstrate ability to use reacher to don pants over feet in sitting with min a.    Status  On-going      OT SHORT TERM GOAL #5   Title  Patient will demonstrate improved coordination as evidenced by reduced time on 9 hole peg test by 10 seconds each hand - to improve ability to manipulate small objects - pills, buttons, etc.      Baseline  right 1 min 19 sec (10/16)   left 58 sec (10/16)    Status  New      OT SHORT TERM GOAL #6   Title  Pt will demonstate improved grip strength by at least 5 pounds total to assist with ADL tasks. (baseline L= 45, R =38)    Status  On-going        OT Long Term Goals - 04/03/18 1657      OT LONG TERM GOAL #1   Title  Pt and wife will be mod I with upgraded home activities program - 05/21/2018    Status  On-going      OT LONG TERM GOAL #2   Title  Pt will demonstrate ability  to comb hair mod I    Status  On-going      OT LONG TERM GOAL #3   Title  Pt will be min a for simple hot familiar meal prep    Status  On-going      OT LONG TERM GOAL #4   Title  Pt will be mod I using reacher to don pants over feet.     Status  On-going      OT LONG TERM GOAL #5   Title  Pt will demonstrate improved grip strength by at  least 7 pounds total to assist with simple cooking tasks (baseline L= 45, R = 38).    Status  On-going      OT LONG TERM GOAL #6   Title  Pt will verbalize understanding of adapted techniques for buttoning and zipping prn    Status  On-going      OT LONG TERM GOAL #7   Title  Pt will be able to lift light weight object at mid reach during simple cooking tasks with LUE    Status  On-going      OT LONG TERM GOAL #8   Title  Pt will be able to lift 2 pound object bilaterally from mid reach to assist with functional activities at home.     Status  On-going            Plan - 04/08/18 1555    Clinical Impression Statement  Pt progresssing toward goals. Pt's wife reports she has not yet gotten larger ball for HEP for proximal shoulder HEP.      Occupational Profile and client history currently impacting functional performance  husband, friend, reitiree.  PMH:  CAD, DM, COPD, hypoxic respiratory failure, HTN    Occupational performance deficits (Please refer to evaluation for details):  ADL's;IADL's;Leisure;Social Participation;Rest and Sleep    Rehab Potential  Fair    Current Impairments/barriers affecting progress:  length of time since onset, severity of deficits    OT Frequency  2x / week    OT Duration  8 weeks    OT Treatment/Interventions  Self-care/ADL training;Moist Heat;Therapeutic exercise;Neuromuscular education;DME and/or AE instruction;Passive range of motion;Manual Therapy;Therapist, nutritional;Therapeutic activities;Patient/family education;Balance training    Plan  Check HEP (grip, coordination, chest presses) - add to HEP for shoulders if possible, , transition to / from supine, NMR for sitting balance, trunk control, work toward low reach    [employer] and Agree with Plan of Care  Patient;Family member/caregiver    Family Member Consulted  wife       Patient will benefit from skilled therapeutic intervention in order to improve the following deficits and  impairments:  Decreased activity tolerance, Decreased balance, Decreased coordination, Decreased range of motion, Decreased mobility, Decreased knowledge of use of DME, Decreased strength, Difficulty walking, Impaired UE functional use, Impaired sensation, Impaired flexibility, Pain  Visit Diagnosis: Muscle weakness (generalized)  Other lack of coordination  Other symptoms and signs involving the nervous system  Abnormal posture  Other symptoms and signs involving the musculoskeletal system  Stiffness of left shoulder, not elsewhere classified  Unsteadiness on feet    Problem List Patient Active Problem List   Diagnosis Date Noted  . 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 Acute respiratory failure with hypoxemia ([place]) 08/18/2017  . Hypokalemia 08/18/2017  . Leukocytosis 08/18/2017  . Muscle atrophy of lower extremity 08/09/2017  . Bowel and bladder incontinence 08/09/2017  . Right-sided muscle weakness 08/09/2017  . Neuropathy 08/09/2017  .  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/2017  . Abnormality of gait 07/12/2017  . Myoclonic jerkings, 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/2017  . Acquired right foot drop 07/12/2017  . UTI (urinary tract infection) due to Enterococcus 07/12/2017  . Pressure injury of skin 06/21/2017  . Acute cystitis 06/20/2017  . Generalized weakness 06/20/2017  . Dehydration 06/20/2017  . Dyspnea 12/26/2016  . Chronic cough 12/26/2016  . Hypersomnia with sleep apnea 10/30/2016  . CSA (central sleep apnea) 10/30/2016  . Wheezing symptom 10/30/2016  . CPAP/BiPAP dependent 07/01/2015  . 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 RLS (restless legs syndrome) 08/25/2014  . Primary gout 08/25/2014  . OSA on CPAP 08/25/2014  . Severe obesity (BMI >= 40) ([place]) 08/25/2014  . Obesity (BMI 30-39.9) 02/18/2013  . Sleep apnea with use of continuous positive airway pressure (CPAP)   . Occlusion and stenosis of carotid artery without mention of cerebral infarction  11/29/2012  . S/P TKR (total knee replacement) 11/29/2012  . History of thyroid cancer     Quay Burow, OTR/L 04/08/2018, 4:01 PM  [facility] [address] [place], [place], [address] Phone: [phone]   Fax:  [phone]  Name: [name] MRN: [id number] Date of Birth: 07/12/1940

## 2018-04-08 NOTE — Patient Instructions (Signed)
1. Grip Strengthening (Resistive Putty)   Squeeze putty using thumb and all fingers. Repeat _20___ times. Do __2__ sessions per day.    Roll putty into tube on table and pinch along the tube. Repeat 5 times.  Do 2 sessions per day.    Coordination Activities  Perform the following activities for 15=20 minutes 1-2 times per day with both hand(s).  Rotate ball in fingertips (clockwise and counter-clockwise). Toss ball between hands. Toss ball in air and catch with the same hand. Flip cards 1 at a time as fast as you can. Pick up coins and place in container or coin bank. Pick up coins one at a time until you get 3-4- in your hand, then move coins from palm to fingertips and place in container once at time.  Screw together nuts and bolts, then unfasten.   Copyright  VHI. All rights reserved.

## 2018-04-09 ENCOUNTER — Ambulatory Visit: Payer: Medicare Other | Admitting: Occupational Therapy

## 2018-04-09 ENCOUNTER — Encounter: Payer: Self-pay | Admitting: Occupational Therapy

## 2018-04-09 DIAGNOSIS — R29818 Other symptoms and signs involving the nervous system: Secondary | ICD-10-CM

## 2018-04-09 DIAGNOSIS — R2681 Unsteadiness on feet: Secondary | ICD-10-CM

## 2018-04-09 DIAGNOSIS — R293 Abnormal posture: Secondary | ICD-10-CM

## 2018-04-09 DIAGNOSIS — R278 Other lack of coordination: Secondary | ICD-10-CM

## 2018-04-09 DIAGNOSIS — R29898 Other symptoms and signs involving the musculoskeletal system: Secondary | ICD-10-CM

## 2018-04-09 DIAGNOSIS — M6281 Muscle weakness (generalized): Secondary | ICD-10-CM

## 2018-04-09 DIAGNOSIS — M25612 Stiffness of left shoulder, not elsewhere classified: Secondary | ICD-10-CM

## 2018-04-09 NOTE — Therapy (Signed)
[facility] [address] [place], [place], [address] Phone: [phone]   Fax:  [phone]  Occupational Therapy Treatment  Patient Details  Name: [name] MRN: [id number] Date of Birth: Feb 06, 1941 No data recorded  Encounter Date: 04/09/2018  OT End of Session - 04/09/18 1640    Visit Number  4    Number of Visits  16    Date for OT Re-Evaluation  05/21/18    Authorization Type  blue medicare will need PN every 10th visit    Authorization Time Period  90 days    Authorization - Visit Number  4    Authorization - Number of Visits  10    OT Start Time  1401    OT Stop Time  1445    OT Time Calculation (min)  44 min    Activity Tolerance  Patient tolerated treatment well       Past Medical History:  Diagnosis Date  . Arthritis   . Barrett esophagus   . Bronchitis   . CAD (coronary artery disease)    pt's wife denies any cardiac history on pt.  . Cancer ([place])    thyroid  . Carotid artery occlusion   . Chronic kidney disease    denies  . Constipation   . COPD (chronic obstructive pulmonary disease) ([place])   . Diabetes mellitus without complication ([place])   . Dyspnea   . GERD (gastroesophageal reflux disease)   . Headache    migraines  . History of thyroid cancer 2008  . History of thyroid cancer   . Hypertension   . OSA (obstructive sleep apnea)   . Pneumonia   . Restless leg   . S/P tendon repair    left thumb  . Sleep apnea with use of continuous positive airway pressure (CPAP)    [facility] , AHI  77cn central and obstrcutive. 16 cm water , 3 cm EPR.   . Thoracic ascending aortic aneurysm (HCC)    4.2 cm ascending TAA 08/18/17 CTA. Annual imaging recommended.  . Thyroid disease   . Ulcer    Peptic ulcer disease    Past Surgical History:  Procedure Laterality Date  . ANTERIOR CERVICAL DECOMP/DISCECTOMY FUSION N/A 09/03/2017   Procedure: ACDF - C3-C4 - C4-C5 - C5-C6;  Surgeon: Kary Kos, MD;  Location: [facility];  Service: Neurosurgery;  Laterality: N/A;  . CAROTID ENDARTERECTOMY Left January 03, 2006   Dr. Amedeo Plenty  . COLONOSCOPY    . EYE SURGERY Bilateral    cataract surgery with lens implants  . game keepers thumb Right   . JOINT REPLACEMENT Left    knee X 2  . ROTATOR CUFF REPAIR Right   . THYROIDECTOMY  2008    There were no vitals filed for this visit.  Subjective Assessment - 04/09/18 1409    Subjective   I get pain in my left tricep when I reach over with R arm to turn off my light at my desk    Patient is accompained by:  Family member   wife   Pertinent History  ACDF C3- C6 09/03/2017. Pt began with LE weakness 05/2017.   Pt has been wheelchair bound starting in 06/2017. Pt in SNF for a month, then went home.  Pt then had ACDF C3- C6 09/03/2017 pt then d/c to SNF until 11/29/2017 for PT, OT and ST.  Pt discharged home with HHPT and OT which ended right before pt  started with PT here in this clinic.  PMH:    Patient Stated Goals  get my arms working better and move better    Currently in Pain?  No/denies                   OT Treatments/Exercises (OP) - 04/09/18 0001      Neurological Re-education Exercises   Other Exercises 1  Neuro re ed to address squat pivot transfers with pt - pt able to complete mutli squat pivot with supervision only  and squat pivot with min a.  Addressed mid to overhead closed chain reach in supine - pt initially required min a for all activities however as muscle recruitment improved pt able to complete with cues only.  Adddressed increasing proximal control via place and hold.   Also addressed rolling to R side and sidelying to sit (pt needed mod a and stated it caused pain in L tricep when rolling onto left side).  Addressed trunk control in sitting - pt with very poor trunk mobility and postures in posterior tilt with very little rotation.  Will continue to address. Feel pt gets pain in L tricep when he hikes shoulders prior to  using either arm due to poor trunk control.                OT Short Term Goals - 04/03/18 1653      OT SHORT TERM GOAL #1   Title  Pt and wife will be mod I with UE home activities program focused on UE strenthening, coordination and UE functional use (BUE's) - 04/23/2018    Status  On-going      OT SHORT TERM GOAL #2   Title  Pt will demonstrate ability for active L shoulder flexion in supine to 125* without pain in prep for overhead functional reach.     Status  On-going      OT SHORT TERM GOAL #3   Title  Pt will demonstrate ability to pick up light weight object with LUE at mid reach (75* of shoulder flexion).      Status  On-going      OT SHORT TERM GOAL #4   Title  Pt will demonstrate ability to use reacher to don pants over feet in sitting with min a.    Status  On-going      OT SHORT TERM GOAL #5   Title  Patient will demonstrate improved coordination as evidenced by reduced time on 9 hole peg test by 10 seconds each hand - to improve ability to manipulate small objects - pills, buttons, etc.      Baseline  right 1 min 19 sec (10/16)   left 58 sec (10/16)    Status  New      OT SHORT TERM GOAL #6   Title  Pt will demonstate improved grip strength by at least 5 pounds total to assist with ADL tasks. (baseline L= 45, R =38)    Status  On-going        OT Long Term Goals - 04/03/18 1657      OT LONG TERM GOAL #1   Title  Pt and wife will be mod I with upgraded home activities program - 05/21/2018    Status  On-going      OT LONG TERM GOAL #2   Title  Pt will demonstrate ability to comb hair mod I    Status  On-going      OT LONG TERM  GOAL #3   Title  Pt will be min a for simple hot familiar meal prep    Status  On-going      OT LONG TERM GOAL #4   Title  Pt will be mod I using reacher to don pants over feet.     Status  On-going      OT LONG TERM GOAL #5   Title  Pt will demonstrate improved grip strength by at least 7 pounds total to assist with simple  cooking tasks (baseline L= 45, R = 38).    Status  On-going      OT LONG TERM GOAL #6   Title  Pt will verbalize understanding of adapted techniques for buttoning and zipping prn    Status  On-going      OT LONG TERM GOAL #7   Title  Pt will be able to lift light weight object at mid reach during simple cooking tasks with LUE    Status  On-going      OT LONG TERM GOAL #8   Title  Pt will be able to lift 2 pound object bilaterally from mid reach to assist with functional activities at home.     Status  On-going            Plan - 04/09/18 1640    Clinical Impression Statement  Pt progressing toward goals. Pt reports he has no questions from HEP given last session.    Occupational Profile and client history currently impacting functional performance  husband, friend, reitiree.  PMH:  CAD, DM, COPD, hypoxic respiratory failure, HTN    Occupational performance deficits (Please refer to evaluation for details):  ADL's;IADL's;Leisure;Social Participation;Rest and Sleep    Rehab Potential  Fair    Current Impairments/barriers affecting progress:  length of time since onset, severity of deficits    OT Frequency  2x / week    OT Duration  8 weeks    OT Treatment/Interventions  Self-care/ADL training;Moist Heat;Therapeutic exercise;Neuromuscular education;DME and/or AE instruction;Passive range of motion;Manual Therapy;Therapist, nutritional;Therapeutic activities;Patient/family education;Balance training    Plan  Check HEP (grip, coordination, chest presses) - add to HEP for shoulders if possible, , transition to / from supine, NMR for sitting balance, trunk control, work toward low reach    [employer] and Agree with Plan of Care  Patient;Family member/caregiver    Family Member Consulted  wife       Patient will benefit from skilled therapeutic intervention in order to improve the following deficits and impairments:  Decreased activity tolerance, Decreased balance, Decreased  coordination, Decreased range of motion, Decreased mobility, Decreased knowledge of use of DME, Decreased strength, Difficulty walking, Impaired UE functional use, Impaired sensation, Impaired flexibility, Pain  Visit Diagnosis: Muscle weakness (generalized)  Other lack of coordination  Other symptoms and signs involving the nervous system  Abnormal posture  Other symptoms and signs involving the musculoskeletal system  Stiffness of left shoulder, not elsewhere classified  Unsteadiness on feet    Problem List Patient Active Problem List   Diagnosis Date Noted  . 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 Acute respiratory failure with hypoxemia ([place]) 08/18/2017  . Hypokalemia 08/18/2017  . Leukocytosis 08/18/2017  . Muscle atrophy of lower extremity 08/09/2017  . Bowel and bladder incontinence 08/09/2017  . Right-sided muscle weakness 08/09/2017  . Neuropathy 08/09/2017  . 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/2017  . Abnormality of gait 07/12/2017  . Myoclonic jerkings, 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/2017  . Acquired right foot drop  07/12/2017  . UTI (urinary tract infection) due to Enterococcus 07/12/2017  . Pressure injury of skin 06/21/2017  . Acute cystitis 06/20/2017  . Generalized weakness 06/20/2017  . Dehydration 06/20/2017  . Dyspnea 12/26/2016  . Chronic cough 12/26/2016  . Hypersomnia with sleep apnea 10/30/2016  . CSA (central sleep apnea) 10/30/2016  . Wheezing symptom 10/30/2016  . CPAP/BiPAP dependent 07/01/2015  . 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 RLS (restless legs syndrome) 08/25/2014  . Primary gout 08/25/2014  . OSA on CPAP 08/25/2014  . Severe obesity (BMI >= 40) ([place]) 08/25/2014  . Obesity (BMI 30-39.9) 02/18/2013  . Sleep apnea with use of continuous positive airway pressure (CPAP)   . 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 S/P TKR (total knee replacement) 11/29/2012  . History of  thyroid cancer     Quay Burow, OTR/L 04/09/2018, 4:42 PM  [facility] [address] [place], [place], [address] Phone: [phone]   Fax:  [phone]  Name: [name] MRN: [id number] Date of Birth: [birth year]

## 2018-04-10 ENCOUNTER — Encounter: Payer: Self-pay | Admitting: Physical Therapy

## 2018-04-10 ENCOUNTER — Ambulatory Visit: Payer: Medicare Other | Admitting: Physical Therapy

## 2018-04-10 DIAGNOSIS — R2681 Unsteadiness on feet: Secondary | ICD-10-CM | POA: Diagnosis not present

## 2018-04-10 DIAGNOSIS — M6281 Muscle weakness (generalized): Secondary | ICD-10-CM

## 2018-04-10 DIAGNOSIS — R278 Other lack of coordination: Secondary | ICD-10-CM

## 2018-04-10 DIAGNOSIS — R293 Abnormal posture: Secondary | ICD-10-CM

## 2018-04-10 DIAGNOSIS — R29818 Other symptoms and signs involving the nervous system: Secondary | ICD-10-CM

## 2018-04-10 NOTE — Patient Instructions (Signed)
Access Code: [email or web address]  URL: [email or web address]  Date: 04/10/2018  Prepared by: Jamey Reas   Exercises  Supine Bridge - 10 reps - 1 sets - 5 hold - 1x daily - 5x weekly  Hooklying Single Leg Bent Knee Fallouts with Resistance - 10 reps - 1 sets - 1x daily - 5x weekly  Seated Knee Extension with Resistance - 10 reps - 1 sets - 1x daily - 5x weekly  Seated Hamstring Stretch with Chair - 10 reps - 6 second hold - 1x daily - 5x weekly  Prone Knee Flexion - 4 reps - 30 second hold - 1x daily - 7x weekly  Prone Gluteal Sets - 10 reps - 2 sets - 1x daily - 7x weekly  Supine Quadriceps Stretch with Strap on Table - 1-3 reps - 1 sets - 30-60 seconds hold - 1x daily - 5x weekly  Supine Quadricep Sets - 10 reps - 2 sets - 1x daily - 7x weekly  Modified Thomas Stretch - 1-3 reps - 1 sets - 330-60 seconds hold - 1x daily - 5x weekly  Seated Chair Push Ups - 10 reps - 1 sets - 3-5 hold - 1x daily - 5x weekly  Seated Trunk Rotation with Arms Crossed - 3-5 reps - 1 sets - 5 seconds hold - 1x daily - 5x weekly  Seated Eccentric Abdominal Lean Back - 3-5 reps - 1 sets - 5 seconds hold - 1x daily - 5x weekly  Sidebending to Elbow Short Sit - 3-5 reps - 1 sets - 5 seconds hold - 1x daily - 5x weekly

## 2018-04-11 NOTE — Therapy (Signed)
[facility] [address] [place], [place], [address] Phone: [phone]   Fax:  [phone]  Physical Therapy Treatment  Patient Details  Name: [name] MRN: [id number] Date of Birth: Nov 24, 1940 Referring Provider (PT): Larey Seat, MD   Encounter Date: 04/10/2018  PT End of Session - 04/10/18 1200    Visit Number  15    Number of Visits  26    Date for PT Re-Evaluation  05/10/18    Authorization Type  [employer] Pt has met $5900 oop, full coverage, 10th visit PN    PT Start Time  1015    PT Stop Time  1100    PT Time Calculation (min)  45 min    Equipment Utilized During Treatment  Gait belt    Activity Tolerance  Patient limited by pain;Patient tolerated treatment well    Behavior During Therapy  [facility] for tasks assessed/performed       Past Medical History:  Diagnosis Date  . Arthritis   . Barrett esophagus   . Bronchitis   . CAD (coronary artery disease)    pt's wife denies any cardiac history on pt.  . Cancer ([place])    thyroid  . Carotid artery occlusion   . Chronic kidney disease    denies  . Constipation   . COPD (chronic obstructive pulmonary disease) ([place])   . Diabetes mellitus without complication ([place])   . Dyspnea   . GERD (gastroesophageal reflux disease)   . Headache    migraines  . History of thyroid cancer 2008  . History of thyroid cancer   . Hypertension   . OSA (obstructive sleep apnea)   . Pneumonia   . Restless leg   . S/P tendon repair    left thumb  . Sleep apnea with use of continuous positive airway pressure (CPAP)    [facility] , AHI  77cn central and obstrcutive. 16 cm water , 3 cm EPR.   . Thoracic ascending aortic aneurysm (HCC)    4.2 cm ascending TAA 08/18/17 CTA. Annual imaging recommended.  . Thyroid disease   . Ulcer    Peptic ulcer disease    Past Surgical History:  Procedure Laterality Date  . ANTERIOR CERVICAL DECOMP/DISCECTOMY FUSION N/A  09/03/2017   Procedure: ACDF - C3-C4 - C4-C5 - C5-C6;  Surgeon: Kary Kos, MD;  Location: [place] [facility];  Service: Neurosurgery;  Laterality: N/A;  . CAROTID ENDARTERECTOMY Left January 03, 2006   Dr. Amedeo Plenty  . COLONOSCOPY    . EYE SURGERY Bilateral    cataract surgery with lens implants  . game keepers thumb Right   . JOINT REPLACEMENT Left    knee X 2  . ROTATOR CUFF REPAIR Right   . THYROIDECTOMY  2008    There were no vitals filed for this visit.  Subjective Assessment - 04/10/18 1020    Subjective  No falls. Transfers are getting better.     Patient is accompained by:  Family member   wife   Pertinent History  CAD, progressive muscular dystrophy, DM2, COPD, hypoxic respiratory failure, HTN,    Patient Stated Goals  To walk in home & community, wife wants him to be able to care for himself if something happens to her    Currently in Pain?  No/denies      Therapeutic Exercise w/c to/from mat table level surfaces scooting transfer with supervision & to/from NuStep with supervision. PT reviewed HEP & pt/wife report  difficulty with knee flexion/quad stretch. Verbal review of previous HEP  Supine Bridge - 10 reps - 1 sets - 5 hold - 1x daily - 5x weekly  Hooklying Single Leg Bent Knee Fallouts with Resistance - 10 reps - 1 sets - 1x daily - 5x weekly  Seated Knee Extension with Resistance - 10 reps - 1 sets - 1x daily - 5x weekly  Seated Hamstring Stretch with Chair - 10 reps - 6 second hold - 1x daily - 5x weekly  Prone Knee Flexion - 4 reps - 30 second hold - 1x daily - 7x weekly  Prone Gluteal Sets - 10 reps - 2 sets - 1x daily - 7x weekly  Supine Quadricep Sets - 10 reps - 2 sets - 1x daily - 7x weekly  Seated Chair Push Ups - 10 reps - 1 sets - 3-5 hold - 1x daily - 5x weekly  UPDATED HEP Supine Quadriceps Stretch with Strap on Table - 1-3 reps - 1 sets - 30-60 seconds hold - 1x daily - 5x weekly  Modified Thomas Stretch - 1-3 reps - 1 sets - 330-60 seconds hold - 1x daily - 5x weekly   Seated Trunk Rotation with Arms Crossed - 3-5 reps - 1 sets - 5 seconds hold - 1x daily - 5x weekly  Seated Eccentric Abdominal Lean Back - 3-5 reps - 1 sets - 5 seconds hold - 1x daily - 5x weekly  Sidebending to Elbow Short Sit - 3-5 reps - 1 sets - 5 seconds hold - 1x daily - 5x weekly   NuStep Level 6 with BUEs & BLEs 10 minutes with cues on full range                       PT Education - 04/10/18 1010    Education Details  updated HEP    Person(s) Educated  Patient;Spouse    Methods  Explanation;Demonstration;Tactile cues;Verbal cues;Handout    Comprehension  Verbalized understanding;Returned demonstration;Verbal cues required;Tactile cues required;Need further instruction       PT Short Term Goals - 04/10/18 1800      PT SHORT TERM GOAL #1   Title  Patient & wife demonstrate & verbalize understanding of updated HEP. (All updated STGs Target Date: 04/15/2018)    Baseline  --    Status  On-going    Target Date  04/15/18      PT SHORT TERM GOAL #2   Title  Patient performs scooting transfers right & left between level surfaces with supervision.    Baseline  03/11/2018 MET    Status  Achieved      PT SHORT TERM GOAL #3   Title  Patient sit to/from supine with patient minimal assist with LE movements.     Baseline  MET 04/10/2018    Status  Achieved      PT SHORT TERM GOAL #4   Title  Sit to/from stand w/c to sink 3 reps with minimal guard.     Baseline  --    Status  On-going    Target Date  04/15/18      PT SHORT TERM GOAL #5   Title  Patient ambulates 40' with RW with moderate assist.     Baseline  --    Status  On-going    Target Date  04/15/18        PT Long Term Goals - 04/10/18 1800      PT LONG TERM GOAL #1  Title  Patient & wife demonstrate & verbalize understanding of ongoing exercise / fitness plan including community programs. (All LTGs Target Date: 05/10/2018) (And then adjust goals for 69/48/54 end of cert date)    Time  3     Period  Months    Status  On-going    Target Date  05/10/18      PT LONG TERM GOAL #2   Title  w/c to bed (mat at similar ht to his bed) scooting or squat pivot transfer modified independent.    Time  3    Period  Months    Status  On-going    Target Date  05/10/18      PT LONG TERM GOAL #3   Title  Sit to/from supine modified independent.    Time  3    Period  Months    Status  On-going    Target Date  05/10/18      PT LONG TERM GOAL #4   Title  Sit to/from stand chairs with armrests to RW with supervision.    Time  3    Period  Months    Status  On-going    Target Date  05/10/18      PT LONG TERM GOAL #5   Title  Patient standing balance with RW with BUE support staticly 2 minutes, scans environment and reaches within length of arm for objects with supervision.     Time  3    Period  Months    Status  On-going    Target Date  05/10/18      PT LONG TERM GOAL #6   Title  Patient ambulates 77' with RW with minA.     Time  3    Period  Months    Status  On-going    Target Date  05/10/18            Plan - 04/10/18 1800    Clinical Impression Statement  Patient appears to understanding updated HEP but may need review. Patient met 3 of 6 updated STGs with other 3 to be checked next session.  Patient is progressing towards LTGs with 3 month Plan of Care through 05/10/2018.     Clinical Impairments Affecting Rehab Potential  weakness & ROM impaired 4 extremities & trunk; dependency transfers, bed mobility, standing & gait    PT Frequency  2x / week    PT Duration  Other (comment)   13 weeks, 90 days   PT Treatment/Interventions  ADLs/Self Care Home Management;Canalith Repostioning;Moist Heat;DME Instruction;Gait training;Stair training;Functional mobility training;Therapeutic activities;Therapeutic exercise;Balance training;Neuromuscular re-education;Patient/family education;Manual techniques;Passive range of motion;Vestibular    PT Next Visit Plan  Check remaining 3  STGs including reviewing updated HEP.     PT Home Exercise Plan  Access Code: [email or web address]     Consulted and Agree with Plan of Care  Patient;Family member/caregiver    Family Member Consulted  wife       Patient will benefit from skilled therapeutic intervention in order to improve the following deficits and impairments:  Abnormal gait, Cardiopulmonary status limiting activity, Decreased activity tolerance, Decreased balance, Decreased endurance, Decreased knowledge of use of DME, Decreased mobility, Decreased range of motion, Decreased strength, Difficulty walking, Dizziness, Impaired flexibility, Impaired UE functional use, Postural dysfunction  Visit Diagnosis: Muscle weakness (generalized)  Other lack of coordination  Other symptoms and signs involving the nervous system  Abnormal posture     Problem List Patient Active Problem List   Diagnosis  Date Noted  . Myelopathy ([place]) 09/03/2017  . Acute respiratory failure with hypoxemia ([place]) 08/18/2017  . Hypokalemia 08/18/2017  . Leukocytosis 08/18/2017  . Muscle atrophy of lower extremity 08/09/2017  . Bowel and bladder incontinence 08/09/2017  . Right-sided muscle weakness 08/09/2017  . Neuropathy 08/09/2017  . 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/2017  . Abnormality of gait 07/12/2017  . Myoclonic jerkings, 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/2017  . Acquired right foot drop 07/12/2017  . UTI (urinary tract infection) due to Enterococcus 07/12/2017  . Pressure injury of skin 06/21/2017  . Acute cystitis 06/20/2017  . Generalized weakness 06/20/2017  . Dehydration 06/20/2017  . Dyspnea 12/26/2016  . Chronic cough 12/26/2016  . Hypersomnia with sleep apnea 10/30/2016  . CSA (central sleep apnea) 10/30/2016  . Wheezing symptom 10/30/2016  . CPAP/BiPAP dependent 07/01/2015  . 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 RLS (restless legs syndrome) 08/25/2014  . Primary gout 08/25/2014  . OSA on CPAP 08/25/2014  . Severe  obesity (BMI >= 40) ([place]) 08/25/2014  . Obesity (BMI 30-39.9) 02/18/2013  . Sleep apnea with use of continuous positive airway pressure (CPAP)   . 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 S/P TKR (total knee replacement) 11/29/2012  . History of thyroid cancer     , PT, DPT 04/11/2018, 7:01 AM  [facility] [address] [place], [place], [address] Phone: [phone]   Fax:  [phone]  Name: [name] MRN: [id number] Date of Birth: 1940-10-22

## 2018-04-15 ENCOUNTER — Encounter: Payer: Self-pay | Admitting: Physical Therapy

## 2018-04-15 ENCOUNTER — Ambulatory Visit: Payer: Medicare Other | Admitting: Physical Therapy

## 2018-04-15 ENCOUNTER — Encounter: Payer: Self-pay | Admitting: Occupational Therapy

## 2018-04-15 ENCOUNTER — Ambulatory Visit: Payer: Medicare Other | Admitting: Occupational Therapy

## 2018-04-15 DIAGNOSIS — M6281 Muscle weakness (generalized): Secondary | ICD-10-CM

## 2018-04-15 DIAGNOSIS — R29818 Other symptoms and signs involving the nervous system: Secondary | ICD-10-CM

## 2018-04-15 DIAGNOSIS — R293 Abnormal posture: Secondary | ICD-10-CM

## 2018-04-15 DIAGNOSIS — R278 Other lack of coordination: Secondary | ICD-10-CM

## 2018-04-15 DIAGNOSIS — M25612 Stiffness of left shoulder, not elsewhere classified: Secondary | ICD-10-CM

## 2018-04-15 DIAGNOSIS — R2681 Unsteadiness on feet: Secondary | ICD-10-CM | POA: Diagnosis not present

## 2018-04-15 DIAGNOSIS — R29898 Other symptoms and signs involving the musculoskeletal system: Secondary | ICD-10-CM

## 2018-04-15 NOTE — Patient Instructions (Signed)
Dressing:  1. To put pants/brief  on over your feet you can use your reacher.  Hold the pants with the reacher where you would hold them with your hand. Do one foot at time.  Pull them up as far as you can in sitting. 2. Stand up in front of the sink - DO NOT PULL WITH YOUR ARMS!! Think: Scoot forward, position feet, LEAN FORWARD, count to three and push with your hands and feet. Once you are up you can reach for the sink to steady yourself. 3. Your wife will be in charge of pants up and down.  YOU will be in charge of your balance.   4. Your wife will be cueing you about posture.  5. To sit down:  Lean forward, reach back with one arm to find arm rest, REACH FOR THE SEAT WITH YOUR BOTTOM, and then control your descent.    You were able to take your pants off your feet without your reacher.  You should be ale to do the brief the same way.  You were able to put shoes on and off by yourself.   Can work on sit to stand, standing and stand to sit through out the day.  Use how your shoulders and head are positioned as well as lining yourself up with the faucet to find midline.     For now just do toileting the way you are doing it.

## 2018-04-15 NOTE — Therapy (Signed)
[facility] [address] [place], [place], [address] Phone: [phone]   Fax:  [phone]  Occupational Therapy Treatment  Patient Details  Name: [name] MRN: [id number] Date of Birth: [birth year] No data recorded  Encounter Date: 04/15/2018  OT End of Session - 04/15/18 1242    Visit Number  5    Number of Visits  16    Authorization Type  blue medicare will need PN every 10th visit    Authorization Time Period  90 days    Authorization - Visit Number  5    Authorization - Number of Visits  10    OT Start Time  1146    OT Stop Time  7793    OT Time Calculation (min)  43 min       Past Medical History:  Diagnosis Date  . Arthritis   . Barrett esophagus   . Bronchitis   . CAD (coronary artery disease)    pt's wife denies any cardiac history on pt.  . Cancer ([place])    thyroid  . Carotid artery occlusion   . Chronic kidney disease    denies  . Constipation   . COPD (chronic obstructive pulmonary disease) ([place])   . Diabetes mellitus without complication ([place])   . Dyspnea   . GERD (gastroesophageal reflux disease)   . Headache    migraines  . History of thyroid cancer 2008  . History of thyroid cancer   . Hypertension   . OSA (obstructive sleep apnea)   . Pneumonia   . Restless leg   . S/P tendon repair    left thumb  . Sleep apnea with use of continuous positive airway pressure (CPAP)    [facility] , AHI  77cn central and obstrcutive. 16 cm water , 3 cm EPR.   . Thoracic ascending aortic aneurysm (HCC)    4.2 cm ascending TAA 08/18/17 CTA. Annual imaging recommended.  . Thyroid disease   . Ulcer    Peptic ulcer disease    Past Surgical History:  Procedure Laterality Date  . ANTERIOR CERVICAL DECOMP/DISCECTOMY FUSION N/A 09/03/2017   Procedure: ACDF - C3-C4 - C4-C5 - C5-C6;  Surgeon: Kary Kos, MD;  Location: [facility];  Service: Neurosurgery;  Laterality: N/A;  . CAROTID ENDARTERECTOMY  Left January 03, 2006   Dr. Amedeo Plenty  . COLONOSCOPY    . EYE SURGERY Bilateral    cataract surgery with lens implants  . game keepers thumb Right   . JOINT REPLACEMENT Left    knee X 2  . ROTATOR CUFF REPAIR Right   . THYROIDECTOMY  2008    There were no vitals filed for this visit.  Subjective Assessment - 04/15/18 1150    Subjective   I have a reacher at home too    Patient is accompained by:  Family member   wife   Pertinent History  ACDF C3- C6 09/03/2017. Pt began with LE weakness 05/2017.   Pt has been wheelchair bound starting in 06/2017. Pt in SNF for a month, then went home.  Pt then had ACDF C3- C6 09/03/2017 pt then d/c to SNF until 11/29/2017 for PT, OT and ST.  Pt discharged home with HHPT and OT which ended right before pt started with PT here in this clinic.  PMH:    Patient Stated Goals  get my arms working better and move better    Currently in Pain?  No/denies  pt can get pain in L tricep of 3/29 with certain movements but not present at this time                          OT Education - 04/15/18 1236    Education Details  LB dressing, sit to stand, stand to sit, standing balance    Person(s) Educated  Patient;Spouse    Methods  Explanation;Demonstration;Verbal cues;Tactile cues;Handout    Comprehension  Verbalized understanding;Returned demonstration   wife able to return demonstrate with pt and able to cue pt appropriately      OT Short Term Goals - 04/15/18 1239      OT SHORT TERM GOAL #1   Title  Pt and wife will be mod I with UE home activities program focused on UE strenthening, coordination and UE functional use (BUE's) - 04/23/2018    Status  On-going      OT SHORT TERM GOAL #2   Title  Pt will demonstrate ability for active L shoulder flexion in supine to 125* without pain in prep for overhead functional reach.     Status  On-going      OT SHORT TERM GOAL #3   Title  Pt will demonstrate ability to pick up light weight object with LUE  at mid reach (75* of shoulder flexion).      Status  On-going      OT SHORT TERM GOAL #4   Title  Pt will demonstrate ability to use reacher to don pants over feet in sitting with min a.    Status  Achieved      OT SHORT TERM GOAL #5   Title  Patient will demonstrate improved coordination as evidenced by reduced time on 9 hole peg test by 10 seconds each hand - to improve ability to manipulate small objects - pills, buttons, etc.      Baseline  right 1 min 19 sec (10/16)   left 58 sec (10/16)    Status  On-going      OT SHORT TERM GOAL #6   Title  Pt will demonstate improved grip strength by at least 5 pounds total to assist with ADL tasks. (baseline L= 45, R =38)    Status  On-going        OT Long Term Goals - 04/15/18 1240      OT LONG TERM GOAL #1   Title  Pt and wife will be mod I with upgraded home activities program - 05/21/2018    Status  On-going      OT LONG TERM GOAL #2   Title  Pt will demonstrate ability to comb hair mod I    Status  On-going      OT LONG TERM GOAL #3   Title  Pt will be min a for simple hot familiar meal prep    Status  On-going      OT LONG TERM GOAL #4   Title  Pt will be mod I using reacher to don pants over feet.     Status  Achieved      OT LONG TERM GOAL #5   Title  Pt will demonstrate improved grip strength by at least 7 pounds total to assist with simple cooking tasks (baseline L= 45, R = 38).    Status  On-going      OT LONG TERM GOAL #6   Title  Pt will verbalize understanding of adapted techniques for buttoning  and zipping prn    Status  Achieved      OT LONG TERM GOAL #7   Title  Pt will be able to lift light weight object at mid reach during simple cooking tasks with LUE    Status  On-going      OT LONG TERM GOAL #8   Title  Pt will be able to lift 2 pound object bilaterally from mid reach to assist with functional activities at home.     Status  On-going            Plan - 04/15/18 1240    Clinical Impression  Statement  Pt progressing toward goals. Pt now able to incorporate sit to stand into LB dressing with assistance from wife.    Occupational Profile and client history currently impacting functional performance  husband, friend, reitiree.  PMH:  CAD, DM, COPD, hypoxic respiratory failure, HTN    Occupational performance deficits (Please refer to evaluation for details):  ADL's;IADL's;Leisure;Social Participation;Rest and Sleep    Rehab Potential  Fair    Current Impairments/barriers affecting progress:  length of time since onset, severity of deficits    OT Frequency  2x / week    OT Duration  8 weeks    OT Treatment/Interventions  Self-care/ADL training;Moist Heat;Therapeutic exercise;Neuromuscular education;DME and/or AE instruction;Passive range of motion;Manual Therapy;Therapist, nutritional;Therapeutic activities;Patient/family education;Balance training    Plan  check LB dressing,  add to HEP for shoulders if possible, , transition to / from supine, NMR for sitting/standing balance, sit to stand and stand to sit, trunk control, work toward low reach    [employer] and Agree with Plan of Care  Patient;Family member/caregiver    Family Member Consulted  wife       Patient will benefit from skilled therapeutic intervention in order to improve the following deficits and impairments:  Decreased activity tolerance, Decreased balance, Decreased coordination, Decreased range of motion, Decreased mobility, Decreased knowledge of use of DME, Decreased strength, Difficulty walking, Impaired UE functional use, Impaired sensation, Impaired flexibility, Pain  Visit Diagnosis: Muscle weakness (generalized)  Other lack of coordination  Other symptoms and signs involving the nervous system  Abnormal posture  Other symptoms and signs involving the musculoskeletal system  Stiffness of left shoulder, not elsewhere classified  Unsteadiness on feet    Problem List Patient Active Problem List    Diagnosis Date Noted  . 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 Acute respiratory failure with hypoxemia ([place]) 08/18/2017  . Hypokalemia 08/18/2017  . Leukocytosis 08/18/2017  . Muscle atrophy of lower extremity 08/09/2017  . Bowel and bladder incontinence 08/09/2017  . Right-sided muscle weakness 08/09/2017  . Neuropathy 08/09/2017  . 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/2017  . Abnormality of gait 07/12/2017  . Myoclonic jerkings, 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/2017  . Acquired right foot drop 07/12/2017  . UTI (urinary tract infection) due to Enterococcus 07/12/2017  . Pressure injury of skin 06/21/2017  . Acute cystitis 06/20/2017  . Generalized weakness 06/20/2017  . Dehydration 06/20/2017  . Dyspnea 12/26/2016  . Chronic cough 12/26/2016  . Hypersomnia with sleep apnea 10/30/2016  . CSA (central sleep apnea) 10/30/2016  . Wheezing symptom 10/30/2016  . CPAP/BiPAP dependent 07/01/2015  . 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 RLS (restless legs syndrome) 08/25/2014  . Primary gout 08/25/2014  . OSA on CPAP 08/25/2014  . Severe obesity (BMI >= 40) ([place]) 08/25/2014  . Obesity (BMI 30-39.9) 02/18/2013  . Sleep apnea with use of continuous positive airway  pressure (CPAP)   . 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 S/P TKR (total knee replacement) 11/29/2012  . History of thyroid cancer     Quay Burow, OTR/L 04/15/2018, 12:44 PM  [facility] [address] [place], [place], [address] Phone: [phone]   Fax:  [phone]  Name: [name] MRN: [id number] Date of Birth: [birth year]

## 2018-04-15 NOTE — Therapy (Signed)
[facility] [address] [place], [place], [address] Phone: [phone]   Fax:  [phone]  Physical Therapy Treatment  Patient Details  Name: [name] MRN: [id number] Date of Birth: 02-26-1941 Referring Provider (PT): Larey Seat, MD   Encounter Date: 04/15/2018  PT End of Session - 04/15/18 1149    Visit Number  16    Number of Visits  26    Date for PT Re-Evaluation  05/10/18    Authorization Type  [employer] Pt has met $5900 oop, full coverage, 10th visit PN    PT Start Time  1105    PT Stop Time  1145    PT Time Calculation (min)  40 min    Equipment Utilized During Treatment  Gait belt    Activity Tolerance  Patient limited by pain;Patient tolerated treatment well    Behavior During Therapy  [facility] for tasks assessed/performed       Past Medical History:  Diagnosis Date  . Arthritis   . Barrett esophagus   . Bronchitis   . CAD (coronary artery disease)    pt's wife denies any cardiac history on pt.  . Cancer ([place])    thyroid  . Carotid artery occlusion   . Chronic kidney disease    denies  . Constipation   . COPD (chronic obstructive pulmonary disease) ([place])   . Diabetes mellitus without complication ([place])   . Dyspnea   . GERD (gastroesophageal reflux disease)   . Headache    migraines  . History of thyroid cancer 2008  . History of thyroid cancer   . Hypertension   . OSA (obstructive sleep apnea)   . Pneumonia   . Restless leg   . S/P tendon repair    left thumb  . Sleep apnea with use of continuous positive airway pressure (CPAP)    [facility] , AHI  77cn central and obstrcutive. 16 cm water , 3 cm EPR.   . Thoracic ascending aortic aneurysm (HCC)    4.2 cm ascending TAA 08/18/17 CTA. Annual imaging recommended.  . Thyroid disease   . Ulcer    Peptic ulcer disease    Past Surgical History:  Procedure Laterality Date  . ANTERIOR CERVICAL DECOMP/DISCECTOMY FUSION N/A  09/03/2017   Procedure: ACDF - C3-C4 - C4-C5 - C5-C6;  Surgeon: Kary Kos, MD;  Location: [place];  Service: Neurosurgery;  Laterality: N/A;  . CAROTID ENDARTERECTOMY Left January 03, 2006   Dr. Amedeo Plenty  . COLONOSCOPY    . EYE SURGERY Bilateral    cataract surgery with lens implants  . game keepers thumb Right   . JOINT REPLACEMENT Left    knee X 2  . ROTATOR CUFF REPAIR Right   . THYROIDECTOMY  2008    There were no vitals filed for this visit.  Subjective Assessment - 04/15/18 1108    Subjective  No falls. Pt with wife's assist were able to perform some updated stretches a few times since last visit.    Patient is accompained by:  Family member   wife   Pertinent History  CAD, progressive muscular dystrophy, DM2, COPD, hypoxic respiratory failure, HTN,    Patient Stated Goals  To walk in home & community, wife wants him to be able to care for himself if something happens to her    Currently in Pain?  Yes    Pain Location  Arm    Pain Orientation  Left  Pain Descriptors / Indicators  Sharp;Stabbing    Pain Onset  More than a month ago    Pain Frequency  Intermittent                       [facility] Adult PT Treatment/Exercise - 04/15/18 0001      Transfers   Transfers  Sit to Stand;Stand to Sit   at sink   Sit to Stand  4: Min guard;With upper extremity assist;With armrests;From chair/3-in-1;3: Mod assist   Mod A to RW   Sit to Stand Details  Tactile cues for placement;Tactile cues for weight shifting;Tactile cues for posture;Verbal cues for sequencing;Verbal cues for technique    Sit to Stand Details (indicate cue type and reason)  Min guard to sink.    Stand to Sit  4: Min guard    Stand to Sit Details (indicate cue type and reason)  Tactile cues for posture;Tactile cues for placement;Visual cues for safe use of DME/AE;Visual cues/gestures for sequencing    Comments  at sink; x3 reps- 30 sec, 2 min, 3 min working on posture, weight shifting (lateral, A/P), and  working on bilateral LE extension                                                      Ambulation/Gait   Ambulation/Gait  Yes    Ambulation/Gait Assistance  3: Mod assist   w/c following for safety.   Ambulation/Gait Assistance Details  RW, pt wearing loafers he came in with,  cues for sequence and proper weightbearing through R LE    Ambulation Distance (Feet)  17 Feet    Assistive device  Rolling walker    Gait Pattern  Step-to pattern;Step-through pattern;Decreased step length - left;Decreased stride length;Right flexed knee in stance;Decreased step length - right;Left flexed knee in stance;Ataxic;Trunk flexed;Narrow base of support;Poor foot clearance - left;Poor foot clearance - right    Ambulation Surface  Level;Indoor             PT Education - 04/15/18 1307    Education Details  Discussed updated HEP - pt and wife have performed some but not all the updated exercises at home yet. Discussed goals checked and encouraged pt to keep working on standing at sink with RW in front, chair behind, and wife's assist.    Person(s) Educated  Patient;Spouse    Methods  Explanation;Demonstration;Verbal cues    Comprehension  Verbalized understanding;Returned demonstration;Need further instruction       PT Short Term Goals - 04/15/18 1310      PT SHORT TERM GOAL #1   Title  Patient & wife demonstrate & verbalize understanding of updated HEP. (All updated STGs Target Date: 04/15/2018)    Baseline  04/15/18 Met with current HEP but not with updated exercises from session last week.    Status  Partially Met      PT SHORT TERM GOAL #2   Title  Patient performs scooting transfers right & left between level surfaces with supervision.    Baseline  03/11/2018 MET    Status  Achieved      PT SHORT TERM GOAL #3   Title  Patient sit to/from supine with patient minimal assist with LE movements.     Baseline  MET 04/10/2018    Status  Achieved  PT SHORT TERM GOAL #4   Title  Sit to/from  stand w/c to sink 3 reps with minimal guard.     Baseline  Met 04/15/18.    Status  Achieved      PT SHORT TERM GOAL #5   Title  Patient ambulates 69' with RW with moderate assist.     Baseline  04/15/18 Pt ambulates 17' with RW Mod. A    Status  Partially Met        PT Long Term Goals - 04/10/18 1800      PT LONG TERM GOAL #1   Title  Patient & wife demonstrate & verbalize understanding of ongoing exercise / fitness plan including community programs. (All LTGs Target Date: 05/10/2018) (And then adjust goals for 24/26/83 end of cert date)    Time  3    Period  Months    Status  On-going    Target Date  05/10/18      PT LONG TERM GOAL #2   Title  w/c to bed (mat at similar ht to his bed) scooting or squat pivot transfer modified independent.    Time  3    Period  Months    Status  On-going    Target Date  05/10/18      PT LONG TERM GOAL #3   Title  Sit to/from supine modified independent.    Time  3    Period  Months    Status  On-going    Target Date  05/10/18      PT LONG TERM GOAL #4   Title  Sit to/from stand chairs with armrests to RW with supervision.    Time  3    Period  Months    Status  On-going    Target Date  05/10/18      PT LONG TERM GOAL #5   Title  Patient standing balance with RW with BUE support staticly 2 minutes, scans environment and reaches within length of arm for objects with supervision.     Time  3    Period  Months    Status  On-going    Target Date  05/10/18      PT LONG TERM GOAL #6   Title  Patient ambulates 6' with RW with minA.     Time  3    Period  Months    Status  On-going    Target Date  05/10/18            Plan - 04/15/18 1314    Clinical Impression Statement  Pt partially met STG # 1 and 5 and met STG # 4 .  Pt made great progress with gait using RW at Mod A level.      Clinical Impairments Affecting Rehab Potential  weakness & ROM impaired 4 extremities & trunk; dependency transfers, bed mobility, standing & gait     PT Frequency  2x / week    PT Duration  Other (comment)   13 weeks, 90 days   PT Treatment/Interventions  ADLs/Self Care Home Management;Canalith Repostioning;Moist Heat;DME Instruction;Gait training;Stair training;Functional mobility training;Therapeutic activities;Therapeutic exercise;Balance training;Neuromuscular re-education;Patient/family education;Manual techniques;Passive range of motion;Vestibular    PT Next Visit Plan   reviewing updated HEP.  Gait and transfers with RW.    PT Home Exercise Plan  Access Code: [email or web address]     Consulted and Agree with Plan of Care  Patient;Family member/caregiver    Family Member Consulted  wife  Patient will benefit from skilled therapeutic intervention in order to improve the following deficits and impairments:  Abnormal gait, Cardiopulmonary status limiting activity, Decreased activity tolerance, Decreased balance, Decreased endurance, Decreased knowledge of use of DME, Decreased mobility, Decreased range of motion, Decreased strength, Difficulty walking, Dizziness, Impaired flexibility, Impaired UE functional use, Postural dysfunction  Visit Diagnosis: Muscle weakness (generalized)  Other lack of coordination  Other symptoms and signs involving the nervous system  Abnormal posture     Problem List Patient Active Problem List   Diagnosis Date Noted  . 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 Acute respiratory failure with hypoxemia ([place]) 08/18/2017  . Hypokalemia 08/18/2017  . Leukocytosis 08/18/2017  . Muscle atrophy of lower extremity 08/09/2017  . Bowel and bladder incontinence 08/09/2017  . Right-sided muscle weakness 08/09/2017  . Neuropathy 08/09/2017  . 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/2017  . Abnormality of gait 07/12/2017  . Myoclonic jerkings, 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/2017  . Acquired right foot drop 07/12/2017  . UTI (urinary tract infection) due to Enterococcus 07/12/2017  . Pressure injury of skin  06/21/2017  . Acute cystitis 06/20/2017  . Generalized weakness 06/20/2017  . Dehydration 06/20/2017  . Dyspnea 12/26/2016  . Chronic cough 12/26/2016  . Hypersomnia with sleep apnea 10/30/2016  . CSA (central sleep apnea) 10/30/2016  . Wheezing symptom 10/30/2016  . CPAP/BiPAP dependent 07/01/2015  . 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 RLS (restless legs syndrome) 08/25/2014  . Primary gout 08/25/2014  . OSA on CPAP 08/25/2014  . Severe obesity (BMI >= 40) ([place]) 08/25/2014  . Obesity (BMI 30-39.9) 02/18/2013  . Sleep apnea with use of continuous positive airway pressure (CPAP)   . 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 S/P TKR (total knee replacement) 11/29/2012  . History of thyroid cancer     Bjorn Loser, PTA  04/15/18, 1:19 PM [name] [address] [place], [place], [address] Phone: [phone]   Fax:  [phone]  Name: [name] MRN: [id number] Date of Birth: [birth year]

## 2018-04-17 ENCOUNTER — Encounter: Payer: Self-pay | Admitting: Physical Therapy

## 2018-04-17 ENCOUNTER — Ambulatory Visit: Payer: Medicare Other | Admitting: Physical Therapy

## 2018-04-17 DIAGNOSIS — R293 Abnormal posture: Secondary | ICD-10-CM

## 2018-04-17 DIAGNOSIS — R2681 Unsteadiness on feet: Secondary | ICD-10-CM

## 2018-04-17 DIAGNOSIS — R2689 Other abnormalities of gait and mobility: Secondary | ICD-10-CM

## 2018-04-17 NOTE — Therapy (Signed)
[facility] [address] [place], [place], [address] Phone: [phone]   Fax:  [phone]  Physical Therapy Treatment  Patient Details  Name: [name] MRN: [id number] Date of Birth: [birth year] Referring Provider (PT): Larey Seat, MD   Encounter Date: 04/17/2018  PT End of Session - 04/17/18 1251    Visit Number  17    Number of Visits  26    Date for PT Re-Evaluation  05/10/18    Authorization Type  [employer] Pt has met $5900 oop, full coverage, 10th visit PN    PT Start Time  1100    PT Stop Time  1145    PT Time Calculation (min)  45 min    Equipment Utilized During Treatment  Gait belt    Activity Tolerance  Patient tolerated treatment well    Behavior During Therapy  [facility] for tasks assessed/performed       Past Medical History:  Diagnosis Date  . Arthritis   . Barrett esophagus   . Bronchitis   . CAD (coronary artery disease)    pt's wife denies any cardiac history on pt.  . Cancer ([place])    thyroid  . Carotid artery occlusion   . Chronic kidney disease    denies  . Constipation   . COPD (chronic obstructive pulmonary disease) ([place])   . Diabetes mellitus without complication ([place])   . Dyspnea   . GERD (gastroesophageal reflux disease)   . Headache    migraines  . History of thyroid cancer 2008  . History of thyroid cancer   . Hypertension   . OSA (obstructive sleep apnea)   . Pneumonia   . Restless leg   . S/P tendon repair    left thumb  . Sleep apnea with use of continuous positive airway pressure (CPAP)    [facility] , AHI  77cn central and obstrcutive. 16 cm water , 3 cm EPR.   . Thoracic ascending aortic aneurysm (HCC)    4.2 cm ascending TAA 08/18/17 CTA. Annual imaging recommended.  . Thyroid disease   . Ulcer    Peptic ulcer disease    Past Surgical History:  Procedure Laterality Date  . ANTERIOR CERVICAL DECOMP/DISCECTOMY FUSION N/A 09/03/2017   Procedure:  ACDF - C3-C4 - C4-C5 - C5-C6;  Surgeon: Kary Kos, MD;  Location: [place];  Service: Neurosurgery;  Laterality: N/A;  . CAROTID ENDARTERECTOMY Left January 03, 2006   Dr. Amedeo Plenty  . COLONOSCOPY    . EYE SURGERY Bilateral    cataract surgery with lens implants  . game keepers thumb Right   . JOINT REPLACEMENT Left    knee X 2  . ROTATOR CUFF REPAIR Right   . THYROIDECTOMY  2008    There were no vitals filed for this visit.  Subjective Assessment - 04/17/18 1107    Subjective  Wife has been very sick, emesis and syncope so has not been able to practice lower body dressing in standing but has been performing standing at the sink.  Has been performing the exercises.    Patient is accompained by:  Family member   wife   Pertinent History  CAD, progressive muscular dystrophy, DM2, COPD, hypoxic respiratory failure, HTN,    Patient Stated Goals  To walk in home & community, wife wants him to be able to care for himself if something happens to her    Currently in Pain?  Yes   shoulders sore  from standing   Pain Score  5     Pain Location  Shoulder    Pain Orientation  Right;Left    Pain Descriptors / Indicators  Sore    Pain Type  Acute pain    Pain Onset  More than a month ago                       [facility] Adult PT Treatment/Exercise - 04/17/18 1155      Transfers   Transfers  Sit to Stand;Stand to [employer]    Sit to Stand  4: Min assist;3: Mod assist    Sit to Stand Details (indicate cue type and reason)  Performed sit <> stand training from wedge on mat to elevate hips over knees to simulate lift chair at home.  Focused on isolated pelvic tilts anterior and posterior to shift COG over BOS to prepare for sit> stand.  Performed 5 tilts with mod--max facilitation to sequence before performing 2 sit <> stand off of wedge with pt holding RW for balance but only utilizing LE to push to stand.      Stand to Sit  4: Min assist    Stand Pivot Transfers  4: Min  assist;3: Mod assist    Stand Pivot Transfer Details (indicate cue type and reason)  performed stand pivot first with tennis shoes with bilat ground reaction AFO to assist with knee control and foot clearance but with AFO pt continued to demonstrate increased difficulty with LE extension during sit <> stand and during pivoting and required increased assistance, mod A.  Pt continues to be very hypersensitive to touch and is likely having reflexive LE flexion in response to touch of AFO on lower leg and ankle DF.  Removed AFO and performed two reps stand pivot w/c <> mat with pt demonstrating improved LE extension during sit > stand and improved ability to shift weight and advance each LE during pivoting; required only min A.                PT Education - 04/17/18 1250    Education Details  Practice sit <> stand from elevated lift chair, stand pivot with RW, bring RW in next session to adjust height, will not pursue AFO at this time due to reflex response    Person(s) Educated  Patient;Spouse    Methods  Explanation;Demonstration    Comprehension  Verbalized understanding;Returned demonstration       PT Short Term Goals - 04/15/18 1310      PT SHORT TERM GOAL #1   Title  Patient & wife demonstrate & verbalize understanding of updated HEP. (All updated STGs Target Date: 04/15/2018)    Baseline  04/15/18 Met with current HEP but not with updated exercises from session last week.    Status  Partially Met      PT SHORT TERM GOAL #2   Title  Patient performs scooting transfers right & left between level surfaces with supervision.    Baseline  03/11/2018 MET    Status  Achieved      PT SHORT TERM GOAL #3   Title  Patient sit to/from supine with patient minimal assist with LE movements.     Baseline  MET 04/10/2018    Status  Achieved      PT SHORT TERM GOAL #4   Title  Sit to/from stand w/c to sink 3 reps with minimal guard.     Baseline  Met 04/15/18.  Status  Achieved      PT  SHORT TERM GOAL #5   Title  Patient ambulates 52' with RW with moderate assist.     Baseline  04/15/18 Pt ambulates 17' with RW Mod. A    Status  Partially Met        PT Long Term Goals - 04/10/18 1800      PT LONG TERM GOAL #1   Title  Patient & wife demonstrate & verbalize understanding of ongoing exercise / fitness plan including community programs. (All LTGs Target Date: 05/10/2018) (And then adjust goals for 74/25/95 end of cert date)    Time  3    Period  Months    Status  On-going    Target Date  05/10/18      PT LONG TERM GOAL #2   Title  w/c to bed (mat at similar ht to his bed) scooting or squat pivot transfer modified independent.    Time  3    Period  Months    Status  On-going    Target Date  05/10/18      PT LONG TERM GOAL #3   Title  Sit to/from supine modified independent.    Time  3    Period  Months    Status  On-going    Target Date  05/10/18      PT LONG TERM GOAL #4   Title  Sit to/from stand chairs with armrests to RW with supervision.    Time  3    Period  Months    Status  On-going    Target Date  05/10/18      PT LONG TERM GOAL #5   Title  Patient standing balance with RW with BUE support staticly 2 minutes, scans environment and reaches within length of arm for objects with supervision.     Time  3    Period  Months    Status  On-going    Target Date  05/10/18      PT LONG TERM GOAL #6   Title  Patient ambulates 59' with RW with minA.     Time  3    Period  Months    Status  On-going    Target Date  05/10/18            Plan - 04/17/18 1251    Clinical Impression Statement  Continued to assess effect of AFO on standing and gait; will not pursue use of bilat AFO at this time due to hyperreflexive response to wearing AFO.  Continued sit <> stand and stand pivot training with RW.  Pt is close to being able to transfer at home with RW with min A; wife to bring pt's RW next session to be adjusted and for further training.    Clinical  Impairments Affecting Rehab Potential  weakness & ROM impaired 4 extremities & trunk; dependency transfers, bed mobility, standing & gait    PT Frequency  2x / week    PT Duration  Other (comment)   13 weeks, 90 days   PT Treatment/Interventions  ADLs/Self Care Home Management;Canalith Repostioning;Moist Heat;DME Instruction;Gait training;Stair training;Functional mobility training;Therapeutic activities;Therapeutic exercise;Balance training;Neuromuscular re-education;Patient/family education;Manual techniques;Passive range of motion;Vestibular    PT Next Visit Plan  Pt to bring RW from home-adjust RW height, sit <> stand from wedge on mat focusing on anterior pelvic tilt, stand pivot with RW, reviewing updated HEP.  Gait and transfers with RW.    PT Home Exercise  Plan  Access Code: [email or web address]     Recommended Other Services  Appropriate for [place]?    Consulted and Agree with Plan of Care  Patient;Family member/caregiver    Family Member Consulted  wife       Patient will benefit from skilled therapeutic intervention in order to improve the following deficits and impairments:  Abnormal gait, Cardiopulmonary status limiting activity, Decreased activity tolerance, Decreased balance, Decreased endurance, Decreased knowledge of use of DME, Decreased mobility, Decreased range of motion, Decreased strength, Difficulty walking, Dizziness, Impaired flexibility, Impaired UE functional use, Postural dysfunction  Visit Diagnosis: Abnormal posture  Unsteadiness on feet  Other abnormalities of gait and mobility     Problem List Patient Active Problem List   Diagnosis Date Noted  . 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 Acute respiratory failure with hypoxemia ([place]) 08/18/2017  . Hypokalemia 08/18/2017  . Leukocytosis 08/18/2017  . Muscle atrophy of lower extremity 08/09/2017  . Bowel and bladder incontinence 08/09/2017  . Right-sided muscle weakness 08/09/2017  . Neuropathy 08/09/2017  . Neurogenic  claudication due to lumbar spinal stenosis 08/09/2017  . Abnormality of gait 07/12/2017  . Myoclonic jerkings, 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/2017  . Acquired right foot drop 07/12/2017  . UTI (urinary tract infection) due to Enterococcus 07/12/2017  . Pressure injury of skin 06/21/2017  . Acute cystitis 06/20/2017  . Generalized weakness 06/20/2017  . Dehydration 06/20/2017  . Dyspnea 12/26/2016  . Chronic cough 12/26/2016  . Hypersomnia with sleep apnea 10/30/2016  . CSA (central sleep apnea) 10/30/2016  . Wheezing symptom 10/30/2016  . CPAP/BiPAP dependent 07/01/2015  . 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 RLS (restless legs syndrome) 08/25/2014  . Primary gout 08/25/2014  . OSA on CPAP 08/25/2014  . Severe obesity (BMI >= 40) ([place]) 08/25/2014  . Obesity (BMI 30-39.9) 02/18/2013  . Sleep apnea with use of continuous positive airway pressure (CPAP)   . 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 S/P TKR (total knee replacement) 11/29/2012  . History of thyroid cancer    Rico Junker, PT, DPT 04/17/18    12:57 PM    [facility] [address] [place], [place], [address] Phone: [phone]   Fax:  [phone]  Name: [name] MRN: [id number] Date of Birth: 11/12/1940

## 2018-04-18 ENCOUNTER — Encounter: Payer: Self-pay | Admitting: Occupational Therapy

## 2018-04-18 ENCOUNTER — Ambulatory Visit: Payer: Medicare Other | Admitting: Occupational Therapy

## 2018-04-18 DIAGNOSIS — R293 Abnormal posture: Secondary | ICD-10-CM

## 2018-04-18 DIAGNOSIS — M6281 Muscle weakness (generalized): Secondary | ICD-10-CM

## 2018-04-18 DIAGNOSIS — R2681 Unsteadiness on feet: Secondary | ICD-10-CM

## 2018-04-18 DIAGNOSIS — R29898 Other symptoms and signs involving the musculoskeletal system: Secondary | ICD-10-CM

## 2018-04-18 DIAGNOSIS — R278 Other lack of coordination: Secondary | ICD-10-CM

## 2018-04-18 DIAGNOSIS — M25612 Stiffness of left shoulder, not elsewhere classified: Secondary | ICD-10-CM

## 2018-04-18 DIAGNOSIS — R29818 Other symptoms and signs involving the nervous system: Secondary | ICD-10-CM

## 2018-04-18 NOTE — Therapy (Signed)
[facility] [address] [place], [place], [address] Phone: [phone]   Fax:  [phone]  Occupational Therapy Treatment  Patient Details  Name: [name] MRN: [id number] Date of Birth: [birth year] No data recorded  Encounter Date: 04/18/2018  OT End of Session - 04/18/18 1413    Visit Number  6    Number of Visits  16    Date for OT Re-Evaluation  05/21/18    Authorization Type  blue medicare will need PN every 10th visit    Authorization Time Period  90 days    Authorization - Visit Number  6    Authorization - Number of Visits  10    OT Start Time  0938    OT Stop Time  1401    OT Time Calculation (min)  44 min    Activity Tolerance  Patient tolerated treatment well       Past Medical History:  Diagnosis Date  . Arthritis   . Barrett esophagus   . Bronchitis   . CAD (coronary artery disease)    pt's wife denies any cardiac history on pt.  . Cancer ([place])    thyroid  . Carotid artery occlusion   . Chronic kidney disease    denies  . Constipation   . COPD (chronic obstructive pulmonary disease) ([place])   . Diabetes mellitus without complication ([place])   . Dyspnea   . GERD (gastroesophageal reflux disease)   . Headache    migraines  . History of thyroid cancer 2008  . History of thyroid cancer   . Hypertension   . OSA (obstructive sleep apnea)   . Pneumonia   . Restless leg   . S/P tendon repair    left thumb  . Sleep apnea with use of continuous positive airway pressure (CPAP)    2011 [facility] , AHI  77cn central and obstrcutive. 16 cm water , 3 cm EPR.   . Thoracic ascending aortic aneurysm (HCC)    4.2 cm ascending TAA 08/18/17 CTA. Annual imaging recommended.  . Thyroid disease   . Ulcer    Peptic ulcer disease    Past Surgical History:  Procedure Laterality Date  . ANTERIOR CERVICAL DECOMP/DISCECTOMY FUSION N/A 09/03/2017   Procedure: ACDF - C3-C4 - C4-C5 - C5-C6;  Surgeon: Kary Kos, MD;  Location: [facility];  Service: Neurosurgery;  Laterality: N/A;  . CAROTID ENDARTERECTOMY Left January 03, 2006   Dr. Amedeo Plenty  . COLONOSCOPY    . EYE SURGERY Bilateral    cataract surgery with lens implants  . game keepers thumb Right   . JOINT REPLACEMENT Left    knee X 2  . ROTATOR CUFF REPAIR Right   . THYROIDECTOMY  2008    There were no vitals filed for this visit.  Subjective Assessment - 04/18/18 1323    Subjective   I had muscle soreness in my arms yesterday from using muscles I hadn't used in awhile but that is all gone today.     Pertinent History  ACDF C3- C6 09/03/2017. Pt began with LE weakness 05/2017.   Pt has been wheelchair bound starting in 06/2017. Pt in SNF for a month, then went home.  Pt then had ACDF C3- C6 09/03/2017 pt then d/c to SNF until 11/29/2017 for PT, OT and ST.  Pt discharged home with HHPT and OT which ended right before pt started with PT here in this clinic.  PMH:  Patient Stated Goals  get my arms working better and move better    Currently in Pain?  No/denies                   OT Treatments/Exercises (OP) - 04/18/18 0001      Neurological Re-education Exercises   Other Exercises 1  Neuro re ed to address sit to stand, static standing with emphasis on postural alignment and control, lateral weight shifting in standing while maintaining postural alignment. Pt with poor awareness of body in space due to signficant sensory deficits as well as decreased past opportunity to be up in standing.  Pt benefits from using mirror as feedback mechanism. Pt with poor motor persistence.  Also addressed stand step transfers with pt - pt needs max cues not to "dive" for the surface he is moving to. Pt also has great difficulty with controlled stand to sit.  Pt and wife report pt dressed from wheelchair level with sit to stand in kitchen "1 or 2 times."  Discussed importance of pt doing this every day in order to  make progress.  Pt and wife verbalized  understanding.                OT Short Term Goals - 04/15/18 1239      OT SHORT TERM GOAL #1   Title  Pt and wife will be mod I with UE home activities program focused on UE strenthening, coordination and UE functional use (BUE's) - 04/23/2018    Status  On-going      OT SHORT TERM GOAL #2   Title  Pt will demonstrate ability for active L shoulder flexion in supine to 125* without pain in prep for overhead functional reach.     Status  On-going      OT SHORT TERM GOAL #3   Title  Pt will demonstrate ability to pick up light weight object with LUE at mid reach (75* of shoulder flexion).      Status  On-going      OT SHORT TERM GOAL #4   Title  Pt will demonstrate ability to use reacher to don pants over feet in sitting with min a.    Status  Achieved      OT SHORT TERM GOAL #5   Title  Patient will demonstrate improved coordination as evidenced by reduced time on 9 hole peg test by 10 seconds each hand - to improve ability to manipulate small objects - pills, buttons, etc.      Baseline  right 1 min 19 sec (10/16)   left 58 sec (10/16)    Status  On-going      OT SHORT TERM GOAL #6   Title  Pt will demonstate improved grip strength by at least 5 pounds total to assist with ADL tasks. (baseline L= 45, R =38)    Status  On-going        OT Long Term Goals - 04/15/18 1240      OT LONG TERM GOAL #1   Title  Pt and wife will be mod I with upgraded home activities program - 05/21/2018    Status  On-going      OT LONG TERM GOAL #2   Title  Pt will demonstrate ability to comb hair mod I    Status  On-going      OT LONG TERM GOAL #3   Title  Pt will be min a for simple hot familiar meal prep  Status  On-going      OT LONG TERM GOAL #4   Title  Pt will be mod I using reacher to don pants over feet.     Status  Achieved      OT LONG TERM GOAL #5   Title  Pt will demonstrate improved grip strength by at least 7 pounds total to assist with simple cooking tasks (baseline  L= 45, R = 38).    Status  On-going      OT LONG TERM GOAL #6   Title  Pt will verbalize understanding of adapted techniques for buttoning and zipping prn    Status  Achieved      OT LONG TERM GOAL #7   Title  Pt will be able to lift light weight object at mid reach during simple cooking tasks with LUE    Status  On-going      OT LONG TERM GOAL #8   Title  Pt will be able to lift 2 pound object bilaterally from mid reach to assist with functional activities at home.     Status  On-going            Plan - 04/18/18 1412    Clinical Impression Statement  Pt with slow but steady progress toward goals. Pt needs encouragement to change how he is doing things at home in order to progress.     Occupational Profile and client history currently impacting functional performance  husband, friend, reitiree.  PMH:  CAD, DM, COPD, hypoxic respiratory failure, HTN    Occupational performance deficits (Please refer to evaluation for details):  ADL's;IADL's;Leisure;Social Participation;Rest and Sleep    Rehab Potential  Fair    Current Impairments/barriers affecting progress:  length of time since onset, severity of deficits    OT Frequency  2x / week    OT Duration  8 weeks    OT Treatment/Interventions  Self-care/ADL training;Moist Heat;Therapeutic exercise;Neuromuscular education;DME and/or AE instruction;Passive range of motion;Manual Therapy;Therapist, nutritional;Therapeutic activities;Patient/family education;Balance training    Plan  check LB dressing,  add to HEP for shoulders if possible, , transition to / from supine, NMR for sitting/standing balance, sit to stand and stand to sit, trunk control, work toward low reach    [employer] and Agree with Plan of Care  Patient;Family member/caregiver    Family Member Consulted  wife       Patient will benefit from skilled therapeutic intervention in order to improve the following deficits and impairments:  Decreased activity tolerance,  Decreased balance, Decreased coordination, Decreased range of motion, Decreased mobility, Decreased knowledge of use of DME, Decreased strength, Difficulty walking, Impaired UE functional use, Impaired sensation, Impaired flexibility, Pain  Visit Diagnosis: Abnormal posture  Unsteadiness on feet  Muscle weakness (generalized)  Other lack of coordination  Other symptoms and signs involving the nervous system  Other symptoms and signs involving the musculoskeletal system  Stiffness of left shoulder, not elsewhere classified    Problem List Patient Active Problem List   Diagnosis Date Noted  . 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 Acute respiratory failure with hypoxemia ([place]) 08/18/2017  . Hypokalemia 08/18/2017  . Leukocytosis 08/18/2017  . Muscle atrophy of lower extremity 08/09/2017  . Bowel and bladder incontinence 08/09/2017  . Right-sided muscle weakness 08/09/2017  . Neuropathy 08/09/2017  . 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/2017  . Abnormality of gait 07/12/2017  . Myoclonic jerkings, 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/2017  . Acquired right foot drop 07/12/2017  . UTI (urinary tract  infection) due to Enterococcus 07/12/2017  . Pressure injury of skin 06/21/2017  . Acute cystitis 06/20/2017  . Generalized weakness 06/20/2017  . Dehydration 06/20/2017  . Dyspnea 12/26/2016  . Chronic cough 12/26/2016  . Hypersomnia with sleep apnea 10/30/2016  . CSA (central sleep apnea) 10/30/2016  . Wheezing symptom 10/30/2016  . CPAP/BiPAP dependent 07/01/2015  . 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 RLS (restless legs syndrome) 08/25/2014  . Primary gout 08/25/2014  . OSA on CPAP 08/25/2014  . Severe obesity (BMI >= 40) ([place]) 08/25/2014  . Obesity (BMI 30-39.9) 02/18/2013  . Sleep apnea with use of continuous positive airway pressure (CPAP)   . 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 S/P TKR (total knee replacement)  11/29/2012  . History of thyroid cancer     Quay Burow, OTR/L 04/18/2018, 2:14 PM  [facility] [address] [place], [place], [address] Phone: [phone]   Fax:  [phone]  Name: DARRIK RICHMAN MRN: [id number] Date of Birth: 03/15/1941

## 2018-04-19 ENCOUNTER — Telehealth: Payer: Self-pay | Admitting: Neurology

## 2018-04-19 NOTE — Telephone Encounter (Signed)
Baclofen initially prescribed by Dr. Saintclair Halsted, NS.  He has been discharged from his care.  Pt is taking baclofen 10mg  po BID now (9am9pm). Doing well for L leg muscle spascity. Notices that it wears off around 1p.    Is asking about taking another dose around 1pm? And then needing refills for this. Please advise if you will do.  Baclofen 10mg  po TID.  Thanks  Pharmacy is [facility]. Has enough to get thru weekend.  May be addressed Monday by Dr. Brett Fairy.

## 2018-04-19 NOTE — Telephone Encounter (Signed)
Pt requesting a call stating he would like to go on Baclofen for his leg spasms please call to advise

## 2018-04-22 ENCOUNTER — Ambulatory Visit: Payer: Medicare Other | Attending: Neurology | Admitting: Physical Therapy

## 2018-04-22 ENCOUNTER — Ambulatory Visit: Payer: Medicare Other | Admitting: Occupational Therapy

## 2018-04-22 ENCOUNTER — Encounter: Payer: Self-pay | Admitting: Physical Therapy

## 2018-04-22 ENCOUNTER — Encounter: Payer: Self-pay | Admitting: Occupational Therapy

## 2018-04-22 DIAGNOSIS — M62838 Other muscle spasm: Secondary | ICD-10-CM | POA: Diagnosis present

## 2018-04-22 DIAGNOSIS — M6281 Muscle weakness (generalized): Secondary | ICD-10-CM | POA: Diagnosis present

## 2018-04-22 DIAGNOSIS — R2681 Unsteadiness on feet: Secondary | ICD-10-CM

## 2018-04-22 DIAGNOSIS — M6249 Contracture of muscle, multiple sites: Secondary | ICD-10-CM | POA: Insufficient documentation

## 2018-04-22 DIAGNOSIS — R29898 Other symptoms and signs involving the musculoskeletal system: Secondary | ICD-10-CM | POA: Insufficient documentation

## 2018-04-22 DIAGNOSIS — G8929 Other chronic pain: Secondary | ICD-10-CM | POA: Diagnosis present

## 2018-04-22 DIAGNOSIS — R29818 Other symptoms and signs involving the nervous system: Secondary | ICD-10-CM

## 2018-04-22 DIAGNOSIS — M25612 Stiffness of left shoulder, not elsewhere classified: Secondary | ICD-10-CM | POA: Diagnosis present

## 2018-04-22 DIAGNOSIS — R278 Other lack of coordination: Secondary | ICD-10-CM | POA: Insufficient documentation

## 2018-04-22 DIAGNOSIS — R2689 Other abnormalities of gait and mobility: Secondary | ICD-10-CM | POA: Diagnosis present

## 2018-04-22 DIAGNOSIS — R293 Abnormal posture: Secondary | ICD-10-CM | POA: Diagnosis present

## 2018-04-22 DIAGNOSIS — M545 Low back pain: Secondary | ICD-10-CM | POA: Insufficient documentation

## 2018-04-22 DIAGNOSIS — Z9181 History of falling: Secondary | ICD-10-CM | POA: Insufficient documentation

## 2018-04-22 MED ORDER — BACLOFEN 10 MG PO TABS: 10.0000 mg | ORAL_TABLET | Freq: Three times a day (TID) | ORAL | 2 refills | Status: DC

## 2018-04-22 NOTE — Therapy (Signed)
[facility] [address] [place], [place], [address] Phone: [phone]   Fax:  [phone]  Physical Therapy Treatment  Patient Details  Name: [name] MRN: [id number] Date of Birth: October 07, 1940 Referring Provider (PT): Larey Seat, MD   Encounter Date: 04/22/2018  PT End of Session - 04/22/18 1337    Visit Number  18    Number of Visits  26    Date for PT Re-Evaluation  05/10/18    Authorization Type  [employer] Pt has met $5900 oop, full coverage, 10th visit PN    PT Start Time  1158    PT Stop Time  1240    PT Time Calculation (min)  42 min    Equipment Utilized During Treatment  Gait belt    Activity Tolerance  Patient tolerated treatment well    Behavior During Therapy  WFL for tasks assessed/performed       Past Medical History:  Diagnosis Date  . Arthritis   . Barrett esophagus   . Bronchitis   . CAD (coronary artery disease)    pt's wife denies any cardiac history on pt.  . Cancer ([place])    thyroid  . Carotid artery occlusion   . Chronic kidney disease    denies  . Constipation   . COPD (chronic obstructive pulmonary disease) ([place])   . Diabetes mellitus without complication ([place])   . Dyspnea   . GERD (gastroesophageal reflux disease)   . Headache    migraines  . History of thyroid cancer 2008  . History of thyroid cancer   . Hypertension   . OSA (obstructive sleep apnea)   . Pneumonia   . Restless leg   . S/P tendon repair    left thumb  . Sleep apnea with use of continuous positive airway pressure (CPAP)    [facility] , AHI  77cn central and obstrcutive. 16 cm water , 3 cm EPR.   . Thoracic ascending aortic aneurysm (HCC)    4.2 cm ascending TAA 08/18/17 CTA. Annual imaging recommended.  . Thyroid disease   . Ulcer    Peptic ulcer disease    Past Surgical History:  Procedure Laterality Date  . ANTERIOR CERVICAL DECOMP/DISCECTOMY FUSION N/A 09/03/2017   Procedure:  ACDF - C3-C4 - C4-C5 - C5-C6;  Surgeon: Kary Kos, MD;  Location: [facility];  Service: Neurosurgery;  Laterality: N/A;  . CAROTID ENDARTERECTOMY Left January 03, 2006   Dr. Amedeo Plenty  . COLONOSCOPY    . EYE SURGERY Bilateral    cataract surgery with lens implants  . game keepers thumb Right   . JOINT REPLACEMENT Left    knee X 2  . ROTATOR CUFF REPAIR Right   . THYROIDECTOMY  2008    There were no vitals filed for this visit.  Subjective Assessment - 04/22/18 1204    Subjective  Had an interesting weekend, was at the craft show most of the day Saturday, sitting in w/c and then no activity on Sunday.  Feels weaker today.    Patient is accompained by:  Family member   wife   Pertinent History  CAD, progressive muscular dystrophy, DM2, COPD, hypoxic respiratory failure, HTN,    Patient Stated Goals  To walk in home & community, wife wants him to be able to care for himself if something happens to her    Currently in Pain?  Yes    Pain Onset  More than a month ago  [facility] PT Treatment/Exercise - 04/22/18 1326      Transfers   Transfers  Sit to Stand;Stand to [employer] Transfers    Sit to Stand  3: Mod assist    Stand to Sit  3: Mod assist    Stand Pivot Transfers  4: Min assist    Stand Pivot Transfer Details (indicate cue type and reason)  with RW with verbal cues for full weight shift to R before advancing LLE.  Continues to require external assistance to control stand > sit      Therapeutic Activites    Therapeutic Activities  Other Therapeutic Activities    Other Therapeutic Activities  Continued to discuss importance of performing stretches and standing HEP each day, even on weekend to maintain and progress gains made in therapy.  Wife states she does not trust weekend caregiver to assist with stretches and standing.  Wife feels comfortable performing with pt; encouraged pt and wife to perform together on weekend if she does not feel the  caregiver will be safe to perform with pt      Neuro Re-ed    Neuro Re-ed Details   Seated on mat performed anterior pelvic tilts in midline and then shifting to R x 8 reps each with therapist using sheet at low back and pelvis to provide active assistance and facilitation of anterior pelvic tilt to improve upright posture and forward weight shift over BOS to increase LE activation and prepare for sit > stand.  When performing pelvic tilts with weight shift to R pt placed RUE on tall rolling table and performed supported reaching up, forwards and to the R with cues for R trunk elongation.  After 3 repetitions pt cued to hold posture for 5-6 seconds while therapist removed support of sheet.  Transitioned to sit <> stand with continued use of sheet around pelvis to facilitate anterior tilt and upright trunk during anterior lean; performed 4 sit > stand from mat with UE support on RW and therapist providing counter pressure at knees to facilitate knee extension during standing.  Once standing therapist facilitated anterior and R lateral weight shifting in standing at pelvis.  During stand > sit pt cued to maintain weight shift to R side and for controlled stand > sit.               PT Education - 04/22/18 1336    Education Details  sit <> stand with pelvic tilt for weight shifting; bring RW in next session, importance of consistent performance of HEP    Person(s) Educated  Patient;Spouse    Methods  Explanation;Demonstration    Comprehension  Verbalized understanding       PT Short Term Goals - 04/15/18 1310      PT SHORT TERM GOAL #1   Title  Patient & wife demonstrate & verbalize understanding of updated HEP. (All updated STGs Target Date: 04/15/2018)    Baseline  04/15/18 Met with current HEP but not with updated exercises from session last week.    Status  Partially Met      PT SHORT TERM GOAL #2   Title  Patient performs scooting transfers right & left between level surfaces with  supervision.    Baseline  03/11/2018 MET    Status  Achieved      PT SHORT TERM GOAL #3   Title  Patient sit to/from supine with patient minimal assist with LE movements.     Baseline  MET 04/10/2018  Status  Achieved      PT SHORT TERM GOAL #4   Title  Sit to/from stand w/c to sink 3 reps with minimal guard.     Baseline  Met 04/15/18.    Status  Achieved      PT SHORT TERM GOAL #5   Title  Patient ambulates 68' with RW with moderate assist.     Baseline  04/15/18 Pt ambulates 17' with RW Mod. A    Status  Partially Met        PT Long Term Goals - 04/10/18 1800      PT LONG TERM GOAL #1   Title  Patient & wife demonstrate & verbalize understanding of ongoing exercise / fitness plan including community programs. (All LTGs Target Date: 05/10/2018) (And then adjust goals for 34/91/79 end of cert date)    Time  3    Period  Months    Status  On-going    Target Date  05/10/18      PT LONG TERM GOAL #2   Title  w/c to bed (mat at similar ht to his bed) scooting or squat pivot transfer modified independent.    Time  3    Period  Months    Status  On-going    Target Date  05/10/18      PT LONG TERM GOAL #3   Title  Sit to/from supine modified independent.    Time  3    Period  Months    Status  On-going    Target Date  05/10/18      PT LONG TERM GOAL #4   Title  Sit to/from stand chairs with armrests to RW with supervision.    Time  3    Period  Months    Status  On-going    Target Date  05/10/18      PT LONG TERM GOAL #5   Title  Patient standing balance with RW with BUE support staticly 2 minutes, scans environment and reaches within length of arm for objects with supervision.     Time  3    Period  Months    Status  On-going    Target Date  05/10/18      PT LONG TERM GOAL #6   Title  Patient ambulates 47' with RW with minA.     Time  3    Period  Months    Status  On-going    Target Date  05/10/18            Plan - 04/22/18 1337    Clinical  Impression Statement  Returned to sit <> stand training with focus on use of pelvic tilts, weight shifting and trunk control to initiate sit <> stand to increase activation of core and LE and decreasing reliance on UE.  Initially pt able to start sit > stand with LE and then had to transition to UE, by final repetition pt had to stand fully with UE due to fatigue.  Will continue to address and have pt and wife practice at home for maximum carry over.    Clinical Impairments Affecting Rehab Potential  weakness & ROM impaired 4 extremities & trunk; dependency transfers, bed mobility, standing & gait    PT Frequency  2x / week    PT Duration  Other (comment)   13 weeks, 90 days   PT Treatment/Interventions  ADLs/Self Care Home Management;Canalith Repostioning;Moist Heat;DME Instruction;Gait training;Stair training;Functional mobility training;Therapeutic activities;Therapeutic exercise;Balance training;Neuromuscular  re-education;Patient/family education;Manual techniques;Passive range of motion;Vestibular    PT Next Visit Plan  appropriate for [place]?  Pt to bring RW from home-adjust height, sit <> stand from wedge on mat focusing on anterior pelvic tilt, stand pivot with RW, weight shifting to R in sitting and standing, reviewing updated HEP.  Gait and transfers with RW.    PT Home Exercise Plan  Access Code: [email or web address]     Consulted and Agree with Plan of Care  Patient;Family member/caregiver    Family Member Consulted  wife       Patient will benefit from skilled therapeutic intervention in order to improve the following deficits and impairments:  Abnormal gait, Cardiopulmonary status limiting activity, Decreased activity tolerance, Decreased balance, Decreased endurance, Decreased knowledge of use of DME, Decreased mobility, Decreased range of motion, Decreased strength, Difficulty walking, Dizziness, Impaired flexibility, Impaired UE functional use, Postural dysfunction  Visit Diagnosis: Abnormal  posture  Unsteadiness on feet  Other abnormalities of gait and mobility     Problem List Patient Active Problem List   Diagnosis Date Noted  . 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 Acute respiratory failure with hypoxemia ([place]) 08/18/2017  . Hypokalemia 08/18/2017  . Leukocytosis 08/18/2017  . Muscle atrophy of lower extremity 08/09/2017  . Bowel and bladder incontinence 08/09/2017  . Right-sided muscle weakness 08/09/2017  . Neuropathy 08/09/2017  . 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/2017  . Abnormality of gait 07/12/2017  . Myoclonic jerkings, 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/2017  . Acquired right foot drop 07/12/2017  . UTI (urinary tract infection) due to Enterococcus 07/12/2017  . Pressure injury of skin 06/21/2017  . Acute cystitis 06/20/2017  . Generalized weakness 06/20/2017  . Dehydration 06/20/2017  . Dyspnea 12/26/2016  . Chronic cough 12/26/2016  . Hypersomnia with sleep apnea 10/30/2016  . CSA (central sleep apnea) 10/30/2016  . Wheezing symptom 10/30/2016  . CPAP/BiPAP dependent 07/01/2015  . 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 RLS (restless legs syndrome) 08/25/2014  . Primary gout 08/25/2014  . OSA on CPAP 08/25/2014  . Severe obesity (BMI >= 40) ([place]) 08/25/2014  . Obesity (BMI 30-39.9) 02/18/2013  . Sleep apnea with use of continuous positive airway pressure (CPAP)   . 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 S/P TKR (total knee replacement) 11/29/2012  . History of thyroid cancer    Rico Junker, PT, DPT 04/22/18    1:42 PM    [facility] [address] [place], [place], [address] Phone: [phone]   Fax:  [phone]  Name: [name] MRN: [id number] Date of Birth: 10-23-1940

## 2018-04-22 NOTE — Therapy (Signed)
[facility] [address] [place], [place], [address] Phone: [phone]   Fax:  [phone]  Occupational Therapy Treatment  Patient Details  Name: [name] MRN: [id number] Date of Birth: Dec 20, 1940 No data recorded  Encounter Date: 04/22/2018  OT End of Session - 04/22/18 1201    Visit Number  7    Number of Visits  16    Date for OT Re-Evaluation  05/21/18    Authorization Type  [employer] medic[employer] will need PN every 10th visit    Authorization Time Period  90 days    Authorization - Visit Number  7    Authorization - Number of Visits  10    OT Start Time  1102    OT Stop Time  1145    OT Time Calculation (min)  43 min    Activity Tolerance  Patient tolerated treatment well       Past Medical History:  Diagnosis Date  . Arthritis   . Barrett esophagus   . Bronchitis   . CAD (coronary artery disease)    pt's wife denies any cardiac history on pt.  . Cancer ([place])    thyroid  . Carotid artery occlusion   . Chronic kidney disease    denies  . Constipation   . COPD (chronic obstructive pulmonary disease) ([place])   . Diabetes mellitus without complication ([place])   . Dyspnea   . GERD (gastroesophageal reflux disease)   . Headache    migraines  . History of thyroid cancer 2008  . History of thyroid cancer   . Hypertension   . OSA (obstructive sleep apnea)   . Pneumonia   . Restless leg   . S/P tendon repair    left thumb  . Sleep apnea with use of continuous positive airway pressure (CPAP)    2011 [facility] , AHI  77cn central and obstrcutive. 16 cm water , 3 cm EPR.   . Thoracic ascending aortic aneurysm (HCC)    4.2 cm ascending TAA 08/18/17 CTA. Annual imaging recommended.  . Thyroid disease   . Ulcer    Peptic ulcer disease    Past Surgical History:  Procedure Laterality Date  . ANTERIOR CERVICAL DECOMP/DISCECTOMY FUSION N/A 09/03/2017   Procedure: ACDF - C3-C4 - C4-C5 - C5-C6;  Surgeon: Kary Kos, MD;  Location: [facility];  Service: Neurosurgery;  Laterality: N/A;  . CAROTID ENDARTERECTOMY Left January 03, 2006   Dr. Amedeo Plenty  . COLONOSCOPY    . EYE SURGERY Bilateral    cataract surgery with lens implants  . game keepers thumb Right   . JOINT REPLACEMENT Left    knee X 2  . ROTATOR CUFF REPAIR Right   . THYROIDECTOMY  2008    There were no vitals filed for this visit.  Subjective Assessment - 04/22/18 1106    Subjective   I feel weaker all over today - wife is also noting increased tightness/spasticity in LLE.     Patient is accompained by:  Family member   wife   Pertinent History  ACDF C3- C6 09/03/2017. Pt began with LE weakness 05/2017.   Pt has been wheelchair bound starting in 06/2017. Pt in SNF for a month, then went home.  Pt then had ACDF C3- C6 09/03/2017 pt then d/c to SNF until 11/29/2017 for PT, OT and ST.  Pt discharged home with HHPT and OT which ended right before pt started with PT here in this  clinic.  PMH:    Patient Stated Goals  get my arms working better and move better                   OT Treatments/Exercises (OP) - 04/22/18 0001      Neurological Re-education Exercises   Other Exercises 1  Neuro re ed to address sit to stand using UE's to push and max cues to push with feet. Pt also needs max cues to lean forward.  Stand step transfer using walker with mod assist and max cues for weight shifting and staying tall on LE's instead of "hanging " on UE's.  Pt with poor ability to determine where he is in space (pt with L posterior bias).  WIth cues and use of mirror pt able to weight shift to RLE in order to step with LLE.  Also addressed controlled stand to sit to elevated surface - pt needs max cues and mod assist.  Addressed active weight to the R with cues for thoracic extension and knee extension then incorporating stepping forward and back with LLE. Pt's performace improved with repetition.  Feel pt felt "weaker" today due to not getting up on his LE"s  most of the weekend. Reinfiorced pt and wife working at home on sit to stand at [employer] sink, static standing with good alignment and lateral weight shifts.  Also addressed working toward improved control for stand to sit.  Pt and wife have still not purchased ball for UE HEP - recommended that they could use  shoe box until they get one.  Stongly encouraged pt and wife to start consistently carrying over LB dressing, UE HEP and practice standing at home in order for pt to meet goals. Pt and wife verbalized understanding.               OT Education - 04/22/18 1158    Education Details  reinforced need for carry over for HEP, LB dressing and sit to stand, standing balance at kitchen sink    Person(s) Educated  Patient;Spouse    Methods  Explanation;Demonstration   pt and wife have a handout   Comprehension  Verbalized understanding;Returned demonstration       OT Short Term Goals - 04/22/18 1159      OT SHORT TERM GOAL #1   Title  Pt and wife will be mod I with UE home activities program focused on UE strenthening, coordination and UE functional use (BUE's) - 04/23/2018    Status  Achieved   however not consistently doing this at home     OT [place] #2   Title  Pt will demonstrate ability for active L shoulder flexion in supine to 125* without pain in prep for overhead functional reach.     Status  On-going      OT SHORT TERM GOAL #3   Title  Pt will demonstrate ability to pick up light weight object with LUE at mid reach (75* of shoulder flexion).      Status  On-going      OT SHORT TERM GOAL #4   Title  Pt will demonstrate ability to use reacher to don pants over feet in sitting with min a.    Status  Achieved      OT SHORT TERM GOAL #5   Title  Patient will demonstrate improved coordination as evidenced by reduced time on 9 hole peg test by 10 seconds each hand - to improve ability to manipulate small objects -  pills, buttons, etc.      Baseline  right 1 min 19 sec  (10/16)   left 58 sec (10/16)    Status  On-going      OT SHORT TERM GOAL #6   Title  Pt will demonstate improved grip strength by at least 5 pounds total to assist with ADL tasks. (baseline L= 45, R =38)    Status  On-going        OT Long Term Goals - 04/22/18 1159      OT LONG TERM GOAL #1   Title  Pt and wife will be mod I with upgraded home activities program - 05/21/2018    Status  On-going      OT LONG TERM GOAL #2   Title  Pt will demonstrate ability to comb hair mod I    Status  On-going      OT LONG TERM GOAL #3   Title  Pt will be min a for simple hot familiar meal prep    Status  On-going      OT LONG TERM GOAL #4   Title  Pt will be mod I using reacher to don pants over feet.     Status  Achieved      OT LONG TERM GOAL #5   Title  Pt will demonstrate improved grip strength by at least 7 pounds total to assist with simple cooking tasks (baseline L= 45, R = 38).    Status  On-going      OT LONG TERM GOAL #6   Title  Pt will verbalize understanding of adapted techniques for buttoning and zipping prn    Status  Achieved      OT LONG TERM GOAL #7   Title  Pt will be able to lift light weight object at mid reach during simple cooking tasks with LUE    Status  On-going      OT LONG TERM GOAL #8   Title  Pt will be able to lift 2 pound object bilaterally from mid reach to assist with functional activities at home.     Status  On-going            Plan - 04/22/18 1159    Clinical Impression Statement  Pt with very slow progress toward goals - pt with improved performance during session. Strongly encouraged to carry over activities at home in order to meet goals.     Occupational Profile and client history currently impacting functional performance  husband, friend, reitiree.  PMH:  CAD, DM, COPD, hypoxic respiratory failure, HTN    Occupational performance deficits (Please refer to evaluation for details):  ADL's;IADL's;Leisure;Social Participation;Rest and  Sleep    Rehab Potential  Fair    Current Impairments/barriers affecting progress:  length of time since onset, severity of deficits    OT Frequency  2x / week    OT Duration  8 weeks    OT Treatment/Interventions  Self-care/ADL training;Moist Heat;Therapeutic exercise;Neuromuscular education;DME and/or AE instruction;Passive range of motion;Manual Therapy;Therapist, nutritional;Therapeutic activities;Patient/family education;Balance training    Plan  CHECK STGS!!!;  check carry over for home activties, NMR for sit to stand, standing, weight shifting, stepping.  NMR for trunk/UE strengthening, low reach    Consulted and Agree with Plan of Care  Patient;Family member/caregiver    Family Member Consulted  wife       Patient will benefit from skilled therapeutic intervention in order to improve the following deficits and impairments:  Decreased activity  tolerance, Decreased balance, Decreased coordination, Decreased range of motion, Decreased mobility, Decreased knowledge of use of DME, Decreased strength, Difficulty walking, Impaired UE functional use, Impaired sensation, Impaired flexibility, Pain  Visit Diagnosis: Abnormal posture  Unsteadiness on feet  Muscle weakness (generalized)  Other lack of coordination  Other symptoms and signs involving the nervous system  Other symptoms and signs involving the musculoskeletal system  Stiffness of left shoulder, not elsewhere classified    Problem List Patient Active Problem List   Diagnosis Date Noted  . 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 Acute respiratory failure with hypoxemia ([place]) 08/18/2017  . Hypokalemia 08/18/2017  . Leukocytosis 08/18/2017  . Muscle atrophy of lower extremity 08/09/2017  . Bowel and bladder incontinence 08/09/2017  . Right-sided muscle weakness 08/09/2017  . Neuropathy 08/09/2017  . 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/2017  . Abnormality of gait 07/12/2017  . Myoclonic jerkings,  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/2017  . Acquired right foot drop 07/12/2017  . UTI (urinary tract infection) due to Enterococcus 07/12/2017  . Pressure injury of skin 06/21/2017  . Acute cystitis 06/20/2017  . Generalized weakness 06/20/2017  . Dehydration 06/20/2017  . Dyspnea 12/26/2016  . Chronic cough 12/26/2016  . Hypersomnia with sleep apnea 10/30/2016  . CSA (central sleep apnea) 10/30/2016  . Wheezing symptom 10/30/2016  . CPAP/BiPAP dependent 07/01/2015  . 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 RLS (restless legs syndrome) 08/25/2014  . Primary gout 08/25/2014  . OSA on CPAP 08/25/2014  . Severe obesity (BMI >= 40) ([place]) 08/25/2014  . Obesity (BMI 30-39.9) 02/18/2013  . Sleep apnea with use of continuous positive airway pressure (CPAP)   . 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 S/P TKR (total knee replacement) 11/29/2012  . History of thyroid cancer     Quay Burow, OTR/L 04/22/2018, 12:02 PM  [facility] [address] [place], [place], [address] Phone: [phone]   Fax:  [phone]  Name: [name] MRN: [id number] Date of Birth: [birth year]

## 2018-04-23 ENCOUNTER — Encounter: Payer: Self-pay | Admitting: Occupational Therapy

## 2018-04-23 ENCOUNTER — Ambulatory Visit: Payer: Medicare Other | Admitting: Occupational Therapy

## 2018-04-23 DIAGNOSIS — R278 Other lack of coordination: Secondary | ICD-10-CM

## 2018-04-23 DIAGNOSIS — R29818 Other symptoms and signs involving the nervous system: Secondary | ICD-10-CM

## 2018-04-23 DIAGNOSIS — M6281 Muscle weakness (generalized): Secondary | ICD-10-CM

## 2018-04-23 DIAGNOSIS — R2681 Unsteadiness on feet: Secondary | ICD-10-CM

## 2018-04-23 DIAGNOSIS — R293 Abnormal posture: Secondary | ICD-10-CM

## 2018-04-23 DIAGNOSIS — M25612 Stiffness of left shoulder, not elsewhere classified: Secondary | ICD-10-CM

## 2018-04-23 DIAGNOSIS — R29898 Other symptoms and signs involving the musculoskeletal system: Secondary | ICD-10-CM

## 2018-04-23 NOTE — Therapy (Signed)
[facility] [address] [place], [place], [address] Phone: [phone]   Fax:  [phone]  Occupational Therapy Treatment  Patient Details  Name: [name] MRN: [id number] Date of Birth: 1941/02/03 No data recorded  Encounter Date: 04/23/2018  OT End of Session - 04/23/18 1514    Visit Number  8    Number of Visits  16    Date for OT Re-Evaluation  05/21/18    Authorization Type  blue medicare will need PN every 10th visit    Authorization Time Period  90 days    Authorization - Visit Number  8    Authorization - Number of Visits  10    OT Start Time  8299    OT Stop Time  1400    OT Time Calculation (min)  43 min    Activity Tolerance  Patient tolerated treatment well       Past Medical History:  Diagnosis Date  . Arthritis   . Barrett esophagus   . Bronchitis   . CAD (coronary artery disease)    pt's wife denies any cardiac history on pt.  . Cancer ([facility])    thyroid  . Carotid artery occlusion   . Chronic kidney disease    denies  . Constipation   . COPD (chronic obstructive pulmonary disease) ([place])   . Diabetes mellitus without complication ([place])   . Dyspnea   . GERD (gastroesophageal reflux disease)   . Headache    migraines  . History of thyroid cancer 2008  . History of thyroid cancer   . Hypertension   . OSA (obstructive sleep apnea)   . Pneumonia   . Restless leg   . S/P tendon repair    left thumb  . Sleep apnea with use of continuous positive airway pressure (CPAP)    [facility] , AHI  77cn central and obstrcutive. 16 cm water , 3 cm EPR.   . Thoracic ascending aortic aneurysm (HCC)    4.2 cm ascending TAA 08/18/17 CTA. Annual imaging recommended.  . Thyroid disease   . Ulcer    Peptic ulcer disease    Past Surgical History:  Procedure Laterality Date  . ANTERIOR CERVICAL DECOMP/DISCECTOMY FUSION N/A 09/03/2017   Procedure: ACDF - C3-C4 - C4-C5 - C5-C6;  Surgeon: Kary Kos, MD;  Location: [facility];  Service: Neurosurgery;  Laterality: N/A;  . CAROTID ENDARTERECTOMY Left January 03, 2006   Dr. Amedeo Plenty  . COLONOSCOPY    . EYE SURGERY Bilateral    cataract surgery with lens implants  . game keepers thumb Right   . JOINT REPLACEMENT Left    knee X 2  . ROTATOR CUFF REPAIR Right   . THYROIDECTOMY  2008    There were no vitals filed for this visit.  Subjective Assessment - 04/23/18 1321    Subjective   We took  urine sample to the doctor today - we are waiting for the results.     Patient is accompained by:  Family member   wife   Pertinent History  ACDF C3- C6 09/03/2017. Pt began with LE weakness 05/2017.   Pt has been wheelchair bound starting in 06/2017. Pt in SNF for a month, then went home.  Pt then had ACDF C3- C6 09/03/2017 pt then d/c to SNF until 11/29/2017 for PT, OT and ST.  Pt discharged home with HHPT and OT which ended right before pt started with PT here in  this clinic.  PMH:    Patient Stated Goals  get my arms working better and move better    Currently in Pain?  No/denies                   OT Treatments/Exercises (OP) - 04/23/18 0001      Neurological Re-education Exercises   Other Exercises 1  Addressed sit to stand with emphasis on leaning forward for weight shift, and decreasing reliance on UE's to push into standing.  Pt repeatedly states "I can't lean forward like that I will fall."  Long discussion with pt and wife regarding pt's willingness to change how he and his wife do things at home - "that isn't how we do it  - I use my arms to pull myself into standing."  Discussed with pt if continues to do things exactly as he is doing them now he will not progress.  Reinforced that pt has not had a single fall in therapy and that his LE"s have not "given out" on him during any therapy session.  Pt understands that he will need to want to do things differently in order to meet goals Worked on just simple leaning forward several times in  sitting and then pointing out to pt that he isn't falling.  Pt stated "not yet."  Will continue to reinforce basic concepts with pt and wife.  ALso addressed static standing with use of mirror for postural alignment and control as well as finding midline (pt with L bias in standing).  Addressed lateral weight shifting with emphasis on alignment as pt tends to flex hips and knees with any attempt to step or move LLE.  Worked on weight shift R with full RLE and thoracic extension prior to stepping with LLE.  Also addressed moving from stand to sit with focus on at least attempting to control descent.  Pt able to slowly reach back with RUE and mod a for descent.  Also addressed squat pivot transfers as well as stand step with walker. Pt needs max cues for full LE engagement as well as step by step cueing for weight shifting and stepping.  Again encouraged pt and wife to work on sit to stand, standing and stand to sit in [place] at home with wheelchair right behind pt.  Pt and wife again stated they would do this - carryover at home for any activity has been poor. Will continue to reinforce.                OT Short Term Goals - 04/23/18 1513      OT SHORT TERM GOAL #1   Title  Pt and wife will be mod I with UE home activities program focused on UE strenthening, coordination and UE functional use (BUE's) - 04/23/2018    Status  Achieved   however not consistently doing this at home     OT [place] #2   Title  Pt will demonstrate ability for active L shoulder flexion in supine to 125* without pain in prep for overhead functional reach.     Status  On-going      OT SHORT TERM GOAL #3   Title  Pt will demonstrate ability to pick up light weight object with LUE at mid reach (75* of shoulder flexion).      Status  On-going      OT SHORT TERM GOAL #4   Title  Pt will demonstrate ability to use reacher to don  pants over feet in sitting with min a.    Status  Achieved      OT SHORT TERM GOAL  #5   Title  Patient will demonstrate improved coordination as evidenced by reduced time on 9 hole peg test by 10 seconds each hand - to improve ability to manipulate small objects - pills, buttons, etc.      Baseline  right 1 min 19 sec (10/16)   left 58 sec (10/16)    Status  On-going      OT SHORT TERM GOAL #6   Title  Pt will demonstate improved grip strength by at least 5 pounds total to assist with ADL tasks. (baseline L= 45, R =38)    Status  On-going        OT Long Term Goals - 04/23/18 1513      OT LONG TERM GOAL #1   Title  Pt and wife will be mod I with upgraded home activities program - 05/21/2018    Status  On-going      OT LONG TERM GOAL #2   Title  Pt will demonstrate ability to comb hair mod I    Status  On-going      OT LONG TERM GOAL #3   Title  Pt will be min a for simple hot familiar meal prep    Status  On-going      OT LONG TERM GOAL #4   Title  Pt will be mod I using reacher to don pants over feet.     Status  Achieved      OT LONG TERM GOAL #5   Title  Pt will demonstrate improved grip strength by at least 7 pounds total to assist with simple cooking tasks (baseline L= 45, R = 38).    Status  On-going      OT LONG TERM GOAL #6   Title  Pt will verbalize understanding of adapted techniques for buttoning and zipping prn    Status  Achieved      OT LONG TERM GOAL #7   Title  Pt will be able to lift light weight object at mid reach during simple cooking tasks with LUE    Status  On-going      OT LONG TERM GOAL #8   Title  Pt will be able to lift 2 pound object bilaterally from mid reach to assist with functional activities at home.     Status  On-going            Plan - 04/23/18 1513    Clinical Impression Statement  Pt resistant to changing how he and his wife do things at home - will continue to work with pt and wife to try and move pt forward toward goals.     Occupational Profile and client history currently impacting functional performance   husband, friend, reitiree.  PMH:  CAD, DM, COPD, hypoxic respiratory failure, HTN    Occupational performance deficits (Please refer to evaluation for details):  ADL's;IADL's;Leisure;Social Participation;Rest and Sleep    Rehab Potential  Fair    Current Impairments/barriers affecting progress:  length of time since onset, severity of deficits    OT Frequency  2x / week    OT Duration  8 weeks    OT Treatment/Interventions  Self-care/ADL training;Moist Heat;Therapeutic exercise;Neuromuscular education;DME and/or AE instruction;Passive range of motion;Manual Therapy;Therapist, nutritional;Therapeutic activities;Patient/family education;Balance training    Plan  CHECK STGS!!!;  check carry over for home activties, NMR for  sit to stand, standing, weight shifting, stepping.  NMR for trunk/UE strengthening, low reach    Consulted and Agree with Plan of Care  Patient;Family member/caregiver    Family Member Consulted  wife       Patient will benefit from skilled therapeutic intervention in order to improve the following deficits and impairments:  Decreased activity tolerance, Decreased balance, Decreased coordination, Decreased range of motion, Decreased mobility, Decreased knowledge of use of DME, Decreased strength, Difficulty walking, Impaired UE functional use, Impaired sensation, Impaired flexibility, Pain  Visit Diagnosis: Abnormal posture  Unsteadiness on feet  Muscle weakness (generalized)  Other lack of coordination  Other symptoms and signs involving the nervous system  Other symptoms and signs involving the musculoskeletal system  Stiffness of left shoulder, not elsewhere classified    Problem List Patient Active Problem List   Diagnosis Date Noted  . Myelopathy ([place]) 09/03/2017  . Acute respiratory failure with hypoxemia ([place]) 08/18/2017  . Hypokalemia 08/18/2017  . Leukocytosis 08/18/2017  . Muscle atrophy of lower extremity 08/09/2017  . Bowel and bladder  incontinence 08/09/2017  . Right-sided muscle weakness 08/09/2017  . Neuropathy 08/09/2017  . 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/2017  . Abnormality of gait 07/12/2017  . Myoclonic jerkings, 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/2017  . Acquired right foot drop 07/12/2017  . UTI (urinary tract infection) due to Enterococcus 07/12/2017  . Pressure injury of skin 06/21/2017  . Acute cystitis 06/20/2017  . Generalized weakness 06/20/2017  . Dehydration 06/20/2017  . Dyspnea 12/26/2016  . Chronic cough 12/26/2016  . Hypersomnia with sleep apnea 10/30/2016  . CSA (central sleep apnea) 10/30/2016  . Wheezing symptom 10/30/2016  . CPAP/BiPAP dependent 07/01/2015  . 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 RLS (restless legs syndrome) 08/25/2014  . Primary gout 08/25/2014  . OSA on CPAP 08/25/2014  . Severe obesity (BMI >= 40) ([place]) 08/25/2014  . Obesity (BMI 30-39.9) 02/18/2013  . Sleep apnea with use of continuous positive airway pressure (CPAP)   . 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 S/P TKR (total knee replacement) 11/29/2012  . History of thyroid cancer     Quay Burow, OTR/L 04/23/2018, 3:16 PM  [facility] [address] [place], [place], [address] Phone: [phone]   Fax:  [phone]  Name: [name] MRN: [id number] Date of Birth: 03-16-1941

## 2018-04-24 ENCOUNTER — Ambulatory Visit: Payer: Medicare Other | Admitting: Physical Therapy

## 2018-04-24 ENCOUNTER — Encounter: Payer: Self-pay | Admitting: Physical Therapy

## 2018-04-24 DIAGNOSIS — R2689 Other abnormalities of gait and mobility: Secondary | ICD-10-CM

## 2018-04-24 DIAGNOSIS — R293 Abnormal posture: Secondary | ICD-10-CM | POA: Diagnosis not present

## 2018-04-24 DIAGNOSIS — R2681 Unsteadiness on feet: Secondary | ICD-10-CM

## 2018-04-24 NOTE — Therapy (Signed)
[facility] [address] [place], [place], [address] Phone: [phone]   Fax:  [phone]  Physical Therapy Treatment  Patient Details  Name: [name] MRN: [id number] Date of Birth: Jul 07, 1940 Referring Provider (PT): Larey Seat, MD   Encounter Date: 04/24/2018  PT End of Session - 04/24/18 1255    Visit Number  19    Number of Visits  26    Date for PT Re-Evaluation  05/10/18    Authorization Type  [employer] Pt has met $5900 oop, full coverage, 10th visit PN    PT Start Time  1105    PT Stop Time  1145    PT Time Calculation (min)  40 min    Activity Tolerance  Patient tolerated treatment well    Behavior During Therapy  [facility] for tasks assessed/performed       Past Medical History:  Diagnosis Date  . Arthritis   . Barrett esophagus   . Bronchitis   . CAD (coronary artery disease)    pt's wife denies any cardiac history on pt.  . Cancer ([place])    thyroid  . Carotid artery occlusion   . Chronic kidney disease    denies  . Constipation   . COPD (chronic obstructive pulmonary disease) ([place])   . Diabetes mellitus without complication ([place])   . Dyspnea   . GERD (gastroesophageal reflux disease)   . Headache    migraines  . History of thyroid cancer 2008  . History of thyroid cancer   . Hypertension   . OSA (obstructive sleep apnea)   . Pneumonia   . Restless leg   . S/P tendon repair    left thumb  . Sleep apnea with use of continuous positive airway pressure (CPAP)    2011 [facility] , AHI  77cn central and obstrcutive. 16 cm water , 3 cm EPR.   . Thoracic ascending aortic aneurysm (HCC)    4.2 cm ascending TAA 08/18/17 CTA. Annual imaging recommended.  . Thyroid disease   . Ulcer    Peptic ulcer disease    Past Surgical History:  Procedure Laterality Date  . ANTERIOR CERVICAL DECOMP/DISCECTOMY FUSION N/A 09/03/2017   Procedure: ACDF - C3-C4 - C4-C5 - C5-C6;  Surgeon: Kary Kos,  MD;  Location: [facility];  Service: Neurosurgery;  Laterality: N/A;  . CAROTID ENDARTERECTOMY Left January 03, 2006   Dr. Amedeo Plenty  . COLONOSCOPY    . EYE SURGERY Bilateral    cataract surgery with lens implants  . game keepers thumb Right   . JOINT REPLACEMENT Left    knee X 2  . ROTATOR CUFF REPAIR Right   . THYROIDECTOMY  2008    There were no vitals filed for this visit.  Subjective Assessment - 04/24/18 1106    Subjective  Pt has a bladder infection; is going to get antibiotic after this visit.  Believes that is why he has been feeling more weak.    Patient is accompained by:  Family member   wife   Pertinent History  CAD, progressive muscular dystrophy, DM2, COPD, hypoxic respiratory failure, HTN,    Patient Stated Goals  To walk in home & community, wife wants him to be able to care for himself if something happens to her    Currently in Pain?  No/denies    Pain Onset  More than a month ago  [facility] PT Treatment/Exercise - 04/24/18 1245      Transfers   Transfers  Sit to Stand;Stand to [employer]    Sit to Stand  4: Min assist;3: Mod assist    Sit to Stand Details (indicate cue type and reason)  initially mod A to stand from w/c but with repetition pt demontrated improved posture, weight shifting and activation of LE extensors when standing from wedge > mat > w/c at sink.  Stood from wedge to facilitate increased anterior pelvic tilt, forward weight shift and activation of LE extensors to prevent sliding forwards.      Stand to Sit  4: Min assist    Stand Pivot Transfers  4: Min assist    Stand Pivot Transfer Details (indicate cue type and reason)  adjusted RW from home.  Min A required to fully shift weight to R LE to pivot with L      Ambulation/Gait   Ambulation/Gait  Yes    Ambulation/Gait Assistance  3: Mod assist    Ambulation/Gait Assistance Details  with RW from home with therapist providing assistance to widen BOS  when stepping with RLE, for full weight shift over RLE, full stance time on RLE to allow for larger step length LLE    Ambulation Distance (Feet)  25 Feet    Assistive device  Rolling walker    Gait Pattern  Step-to pattern;Step-through pattern;Decreased step length - left;Decreased stride length;Right flexed knee in stance;Decreased step length - right;Left flexed knee in stance;Ataxic;Trunk flexed;Narrow base of support;Poor foot clearance - left;Poor foot clearance - right;Decreased stance time - right;Decreased weight shift to right    Ambulation Surface  Level;Indoor          Balance Exercises - 04/24/18 1252      Balance Exercises: Standing   Sidestepping  Upper extremity support;4 reps   at counter with weight shifting   Other Standing Exercises  Performed 3 reps of weight shifting at sink to L and R with one UE support on sink and contralateral UE reaching up at a diagonal, supported by cabinet to increase trunk elongation and extension when weight shifting in that direction; min-mod A to perform        PT Education - 04/24/18 1254    Education Details  after performing weight shifting as instructed by OT, add in side step to R and L at sink.  Continue standing, weight shifting, stretches at home until next week sessions    Person(s) Educated  Patient;Spouse    Methods  Explanation;Demonstration    Comprehension  Verbalized understanding;Returned demonstration       PT Short Term Goals - 04/15/18 1310      PT SHORT TERM GOAL #1   Title  Patient & wife demonstrate & verbalize understanding of updated HEP. (All updated STGs Target Date: 04/15/2018)    Baseline  04/15/18 Met with current HEP but not with updated exercises from session last week.    Status  Partially Met      PT SHORT TERM GOAL #2   Title  Patient performs scooting transfers right & left between level surfaces with supervision.    Baseline  03/11/2018 MET    Status  Achieved      PT SHORT TERM GOAL #3    Title  Patient sit to/from supine with patient minimal assist with LE movements.     Baseline  MET 04/10/2018    Status  Achieved      PT SHORT  TERM GOAL #4   Title  Sit to/from stand w/c to sink 3 reps with minimal guard.     Baseline  Met 04/15/18.    Status  Achieved      PT SHORT TERM GOAL #5   Title  Patient ambulates 59' with RW with moderate assist.     Baseline  04/15/18 Pt ambulates 17' with RW Mod. A    Status  Partially Met        PT Long Term Goals - 04/10/18 1800      PT LONG TERM GOAL #1   Title  Patient & wife demonstrate & verbalize understanding of ongoing exercise / fitness plan including community programs. (All LTGs Target Date: 05/10/2018) (And then adjust goals for 12/17/14 end of cert date)    Time  3    Period  Months    Status  On-going    Target Date  05/10/18      PT LONG TERM GOAL #2   Title  w/c to bed (mat at similar ht to his bed) scooting or squat pivot transfer modified independent.    Time  3    Period  Months    Status  On-going    Target Date  05/10/18      PT LONG TERM GOAL #3   Title  Sit to/from supine modified independent.    Time  3    Period  Months    Status  On-going    Target Date  05/10/18      PT LONG TERM GOAL #4   Title  Sit to/from stand chairs with armrests to RW with supervision.    Time  3    Period  Months    Status  On-going    Target Date  05/10/18      PT LONG TERM GOAL #5   Title  Patient standing balance with RW with BUE support staticly 2 minutes, scans environment and reaches within length of arm for objects with supervision.     Time  3    Period  Months    Status  On-going    Target Date  05/10/18      PT LONG TERM GOAL #6   Title  Patient ambulates 31' with RW with minA.     Time  3    Period  Months    Status  On-going    Target Date  05/10/18            Plan - 04/24/18 1256    Clinical Impression Statement  Pt demonstrated in session improvement in sit > stand weight shifting,  sequencing and LE activation.  Pt also demonstrated improved weight shifting and LE activation during gait and was able to increase distance today to 61' with one person and RW.  Continued to focus on weight shifting at sink adding in side stepping.  Will continue to progress towards LTG.    Clinical Impairments Affecting Rehab Potential  weakness & ROM impaired 4 extremities & trunk; dependency transfers, bed mobility, standing & gait    PT Frequency  2x / week    PT Duration  Other (comment)   13 weeks, 90 days   PT Treatment/Interventions  ADLs/Self Care Home Management;[place] Repostioning;Moist Heat;DME Instruction;Gait training;Stair training;Functional mobility training;Therapeutic activities;Therapeutic exercise;Balance training;Neuromuscular re-education;Patient/family education;Manual techniques;Passive range of motion;Vestibular    PT Next Visit Plan  10th visit PN!  appropriate for [place]?  sit <> stand from wedge on mat focusing on anterior  pelvic tilt, stand pivot with RW, weight shifting to R in sitting and standing reaching up and to the side, reviewing updated HEP.  Gait and transfers with RW.    PT Home Exercise Plan  Access Code: [email or web address]     Consulted and Agree with Plan of Care  Patient;Family member/caregiver    Family Member Consulted  wife       Patient will benefit from skilled therapeutic intervention in order to improve the following deficits and impairments:  Abnormal gait, Cardiopulmonary status limiting activity, Decreased activity tolerance, Decreased balance, Decreased endurance, Decreased knowledge of use of DME, Decreased mobility, Decreased range of motion, Decreased strength, Difficulty walking, Dizziness, Impaired flexibility, Impaired UE functional use, Postural dysfunction  Visit Diagnosis: Abnormal posture  Unsteadiness on feet  Other abnormalities of gait and mobility     Problem List Patient Active Problem List   Diagnosis Date Noted  .  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 Acute respiratory failure with hypoxemia ([place]) 08/18/2017  . Hypokalemia 08/18/2017  . Leukocytosis 08/18/2017  . Muscle atrophy of lower extremity 08/09/2017  . Bowel and bladder incontinence 08/09/2017  . Right-sided muscle weakness 08/09/2017  . Neuropathy 08/09/2017  . 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/2017  . Abnormality of gait 07/12/2017  . Myoclonic jerkings, 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/2017  . Acquired right foot drop 07/12/2017  . UTI (urinary tract infection) due to Enterococcus 07/12/2017  . Pressure injury of skin 06/21/2017  . Acute cystitis 06/20/2017  . Generalized weakness 06/20/2017  . Dehydration 06/20/2017  . Dyspnea 12/26/2016  . Chronic cough 12/26/2016  . Hypersomnia with sleep apnea 10/30/2016  . CSA (central sleep apnea) 10/30/2016  . Wheezing symptom 10/30/2016  . CPAP/BiPAP dependent 07/01/2015  . 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 RLS (restless legs syndrome) 08/25/2014  . Primary gout 08/25/2014  . OSA on CPAP 08/25/2014  . Severe obesity (BMI >= 40) ([place]) 08/25/2014  . Obesity (BMI 30-39.9) 02/18/2013  . Sleep apnea with use of continuous positive airway pressure (CPAP)   . 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 S/P TKR (total knee replacement) 11/29/2012  . History of thyroid cancer     Rico Junker, PT, DPT 04/24/18    12:59 PM    [facility] [address] [place], [place], [address] Phone: [phone]   Fax:  [phone]  Name: [name] MRN: [id number] Date of Birth: 1940/11/15

## 2018-04-29 ENCOUNTER — Ambulatory Visit: Payer: Medicare Other | Admitting: Physical Therapy

## 2018-04-29 ENCOUNTER — Encounter: Payer: Self-pay | Admitting: Occupational Therapy

## 2018-04-29 ENCOUNTER — Ambulatory Visit: Payer: Medicare Other | Admitting: Occupational Therapy

## 2018-04-29 DIAGNOSIS — R278 Other lack of coordination: Secondary | ICD-10-CM

## 2018-04-29 DIAGNOSIS — R29818 Other symptoms and signs involving the nervous system: Secondary | ICD-10-CM

## 2018-04-29 DIAGNOSIS — R29898 Other symptoms and signs involving the musculoskeletal system: Secondary | ICD-10-CM

## 2018-04-29 DIAGNOSIS — M25612 Stiffness of left shoulder, not elsewhere classified: Secondary | ICD-10-CM

## 2018-04-29 DIAGNOSIS — R2681 Unsteadiness on feet: Secondary | ICD-10-CM

## 2018-04-29 DIAGNOSIS — R2689 Other abnormalities of gait and mobility: Secondary | ICD-10-CM

## 2018-04-29 DIAGNOSIS — R293 Abnormal posture: Secondary | ICD-10-CM

## 2018-04-29 DIAGNOSIS — M6281 Muscle weakness (generalized): Secondary | ICD-10-CM

## 2018-04-29 NOTE — Patient Instructions (Signed)
From this point on, you will need to be consistent in doing your home exercise in order to continue to make gains.  For OT that includes:  1. Arm exercises laying down - chest presses 15 reps x2 and overhead reach 15 reps x2.  Do 2 times per day. Do before you get up in the morning and right before you go to bed at night so you will already be laying down.  You will need to use a ball or a box or something that is shoulder width and allows your palms to face one another.   2. Coordination exercises 1-2 times per day, 15 - 20 minutes per session  3. Grip and pinch putty - 2 times per day after breakfast and after dinner  4. Dressing every single day from your chair and working on sit to stand, standing balance during it. You should be doing as much as you can by yourself. Your wife can help hike your pants while you take care of your balance.  5. Working on sit to stand, standing, weight shifting side to side and then CONTROLLED stand to sit.  Work this in 3-4 times per day.  It will be easier if you do it several times per day. It becomes harder when you just do it here and there.  Standing to dress in the morning counts as one time!!

## 2018-04-29 NOTE — Therapy (Signed)
[facility] [address] [place], [place], [address] Phone: [phone]   Fax:  [phone]  Occupational Therapy Treatment  Patient Details  Name: [name] MRN: [id number] Date of Birth: 1940-11-04 No data recorded  Encounter Date: 04/29/2018  OT End of Session - 04/29/18 1249    Visit Number  9    Number of Visits  16    Date for OT Re-Evaluation  05/21/18    Authorization Type  blue medicare will need PN every 10th visit    Authorization Time Period  90 days    Authorization - Visit Number  9    Authorization - Number of Visits  10    OT Start Time  1146    OT Stop Time  1230    OT Time Calculation (min)  44 min    Activity Tolerance  Patient tolerated treatment well       Past Medical History:  Diagnosis Date  . Arthritis   . Barrett esophagus   . Bronchitis   . CAD (coronary artery disease)    pt's wife denies any cardiac history on pt.  . Cancer ([place])    thyroid  . Carotid artery occlusion   . Chronic kidney disease    denies  . Constipation   . COPD (chronic obstructive pulmonary disease) ([place])   . Diabetes mellitus without complication ([place])   . Dyspnea   . GERD (gastroesophageal reflux disease)   . Headache    migraines  . History of thyroid cancer 2008  . History of thyroid cancer   . Hypertension   . OSA (obstructive sleep apnea)   . Pneumonia   . Restless leg   . S/P tendon repair    left thumb  . Sleep apnea with use of continuous positive airway pressure (CPAP)    [facility] , AHI  77cn central and obstrcutive. 16 cm water , 3 cm EPR.   . Thoracic ascending aortic aneurysm (HCC)    4.2 cm ascending TAA 08/18/17 CTA. Annual imaging recommended.  . Thyroid disease   . Ulcer    Peptic ulcer disease    Past Surgical History:  Procedure Laterality Date  . ANTERIOR CERVICAL DECOMP/DISCECTOMY FUSION N/A 09/03/2017   Procedure: ACDF - C3-C4 - C4-C5 - C5-C6;  Surgeon: Kary Kos, MD;  Location: [facility];  Service: Neurosurgery;  Laterality: N/A;  . CAROTID ENDARTERECTOMY Left January 03, 2006   Dr. Amedeo Plenty  . COLONOSCOPY    . EYE SURGERY Bilateral    cataract surgery with lens implants  . game keepers thumb Right   . JOINT REPLACEMENT Left    knee X 2  . ROTATOR CUFF REPAIR Right   . THYROIDECTOMY  2008    There were no vitals filed for this visit.  Subjective Assessment - 04/29/18 1154    Subjective   I did have a bladder infection - I am almost done with the antibiotic    Patient is accompained by:  Family member   wife   Pertinent History  ACDF C3- C6 09/03/2017. Pt began with LE weakness 05/2017.   Pt has been wheelchair bound starting in 06/2017. Pt in SNF for a month, then went home.  Pt then had ACDF C3- C6 09/03/2017 pt then d/c to SNF until 11/29/2017 for PT, OT and ST.  Pt discharged home with HHPT and OT which ended right before pt started with PT here in this clinic.  PMH:    Patient Stated Goals  get my arms working better and move better    Currently in Pain?  No/denies                   OT Treatments/Exercises (OP) - 04/29/18 0001      ADLs   Functional Mobility  Practiced stand step transfer with walker to both sides with emphasis on increasing LE' demand, postural aligment and control, weight shifting and controlled stand to sit. Pt required min a and cues for sit stand then contact guard to the left and supevision to the right with cues.  MIn a for controlled descent.  Addressed bed mobilty for sit to supine and supine to sit. Pt stated "I pull on my bar at home" - again reinforced preference to use UE's to push vs pulling to sitting in order to improve strength .     ADL Comments  Checked STGs'- see goal section for updates.  Pt has made progress however is not consistently completing HEP at home. Long discussion with pt and wife to reinforce need to consistency if pt wishes to acheive LTG"s and continue with therapy. Pt and wife both  verbalized understanding.        Neurological Re-education Exercises   Reciprocal Movements  Neuro re ed to address UE AROM and strength in supine using ball for chest presses and overhead reach. Pt has no pain with activity.               OT Education - 04/29/18 1245    Education Details  entire OT HEP given to pt on one page and strong reinforcement for consistency    Person(s) Educated  Patient;Spouse    Methods  Explanation;Demonstration;Handout;Verbal cues    Comprehension  Verbalized understanding;Returned demonstration       OT Short Term Goals - 04/29/18 1245      OT SHORT TERM GOAL #1   Title  Pt and wife will be mod I with UE home activities program focused on UE strenthening, coordination and UE functional use (BUE's) - 04/23/2018    Status  Achieved   however not consistently doing this at home     OT [place] #2   Title  Pt will demonstrate ability for active L shoulder flexion in supine to 125* without pain in prep for overhead functional reach.     Status  Achieved   04/29/2018  140*     OT SHORT TERM GOAL #3   Title  Pt will demonstrate ability to pick up light weight object with LUE at mid reach (75* of shoulder flexion).      Status  Achieved   04/29/2018  75*     OT SHORT TERM GOAL #4   Title  Pt will demonstrate ability to use reacher to don pants over feet in sitting with min a.    Status  Achieved      OT SHORT TERM GOAL #5   Title  Patient will demonstrate improved coordination as evidenced by reduced time on 9 hole peg test by 10 seconds each hand - to improve ability to manipulate small objects - pills, buttons, etc.      Baseline  right 1 min 19 sec (10/16)   left 58 sec (10/16)    Status  Achieved   04/29/2018  R= 47.53, L= 47.39     OT SHORT TERM GOAL #6   Title  Pt will demonstate improved grip strength  by at least 5 pounds total to assist with ADL tasks. (baseline L= 45, R =38)    Status  Not Met   04/29/2018 R= 40 pounds, L = 45        OT Long Term Goals - 04/29/18 1247      OT LONG TERM GOAL #1   Title  Pt and wife will be mod I with upgraded home activities program - 05/21/2018    Status  On-going      OT LONG TERM GOAL #2   Title  Pt will demonstrate ability to comb hair mod I    Status  Achieved      OT LONG TERM GOAL #3   Title  Pt will be min a for simple hot familiar meal prep    Status  On-going      OT LONG TERM GOAL #4   Title  Pt will be mod I using reacher to don pants over feet.     Status  Achieved      OT LONG TERM GOAL #5   Title  Pt will demonstrate improved grip strength by at least 7 pounds total to assist with simple cooking tasks (baseline L= 45, R = 38).    Status  On-going      OT LONG TERM GOAL #6   Title  Pt will verbalize understanding of adapted techniques for buttoning and zipping prn    Status  Achieved      OT LONG TERM GOAL #7   Title  Pt will be able to lift light weight object at mid reach during simple cooking tasks with LUE    Status  On-going      OT LONG TERM GOAL #8   Title  Pt will be able to lift 2 pound object bilaterally from mid reach to assist with functional activities at home.     Status  On-going            Plan - 04/29/18 1247    Clinical Impression Statement  Pt has met all but 1 STG - progress shared with pt and wife. Pt encouraged to be more consistent with HEP in order to work toward LTG's.     Occupational Profile and client history currently impacting functional performance  husband, friend, reitiree.  PMH:  CAD, DM, COPD, hypoxic respiratory failure, HTN    Occupational performance deficits (Please refer to evaluation for details):  ADL's;IADL's;Leisure;Social Participation;Rest and Sleep    Rehab Potential  Fair    Current Impairments/barriers affecting progress:  length of time since onset, severity of deficits    OT Frequency  2x / week    OT Duration  8 weeks    OT Treatment/Interventions  Self-care/ADL training;Moist  Heat;Therapeutic exercise;Neuromuscular education;DME and/or AE instruction;Passive range of motion;Manual Therapy;Therapist, nutritional;Therapeutic activities;Patient/family education;Balance training    Plan   check carry over for home activties, NMR for sit to stand, standing, weight shifting, stepping.  NMR for trunk/UE strengthening, mid functional reach    Consulted and Agree with Plan of Care  Patient;Family member/caregiver    Family Member Consulted  wife       Patient will benefit from skilled therapeutic intervention in order to improve the following deficits and impairments:  Decreased activity tolerance, Decreased balance, Decreased coordination, Decreased range of motion, Decreased mobility, Decreased knowledge of use of DME, Decreased strength, Difficulty walking, Impaired UE functional use, Impaired sensation, Impaired flexibility, Pain  Visit Diagnosis: Abnormal posture  Unsteadiness on feet  Muscle weakness (generalized)  Other lack of coordination  Other symptoms and signs involving the nervous system  Other symptoms and signs involving the musculoskeletal system  Stiffness of left shoulder, not elsewhere classified    Problem List Patient Active Problem List   Diagnosis Date Noted  . Myelopathy ([place]) 09/03/2017  . Acute respiratory failure with hypoxemia ([place]) 08/18/2017  . Hypokalemia 08/18/2017  . Leukocytosis 08/18/2017  . Muscle atrophy of lower extremity 08/09/2017  . Bowel and bladder incontinence 08/09/2017  . Right-sided muscle weakness 08/09/2017  . Neuropathy 08/09/2017  . 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/2017  . Abnormality of gait 07/12/2017  . Myoclonic jerkings, 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/2017  . Acquired right foot drop 07/12/2017  . UTI (urinary tract infection) due to Enterococcus 07/12/2017  . Pressure injury of skin 06/21/2017  . Acute cystitis 06/20/2017  . Generalized weakness 06/20/2017  . Dehydration  06/20/2017  . Dyspnea 12/26/2016  . Chronic cough 12/26/2016  . Hypersomnia with sleep apnea 10/30/2016  . CSA (central sleep apnea) 10/30/2016  . Wheezing symptom 10/30/2016  . CPAP/BiPAP dependent 07/01/2015  . 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 RLS (restless legs syndrome) 08/25/2014  . Primary gout 08/25/2014  . OSA on CPAP 08/25/2014  . Severe obesity (BMI >= 40) ([place]) 08/25/2014  . Obesity (BMI 30-39.9) 02/18/2013  . Sleep apnea with use of continuous positive airway pressure (CPAP)   . 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 S/P TKR (total knee replacement) 11/29/2012  . History of thyroid cancer     Quay Burow, OTR/L 04/29/2018, 12:50 PM  [facility] [address] [place], [place], [address] Phone: [phone]   Fax:  [phone]  Name: [name] MRN: [id number] Date of Birth: January 21, 1941

## 2018-04-29 NOTE — Therapy (Signed)
[facility] [address] [place], [place], [address] Phone: [phone]   Fax:  [phone]  Physical Therapy Treatment and 10th visit PN  Patient Details  Name: [name] MRN: [id number] Date of Birth: 01-May-1941 Referring Provider (PT): Larey Seat, MD   Encounter Date: 04/29/2018  PT End of Session - 04/29/18 1054    Visit Number  20    Number of Visits  26    Date for PT Re-Evaluation  05/13/18    Authorization Type  [employer] Pt has met $5900 oop, full coverage, 10th visit PN    PT Start Time  1103    PT Stop Time  1150    PT Time Calculation (min)  47 min    Activity Tolerance  Patient tolerated treatment well    Behavior During Therapy  [facility] for tasks assessed/performed       Past Medical History:  Diagnosis Date  . Arthritis   . Barrett esophagus   . Bronchitis   . CAD (coronary artery disease)    pt's wife denies any cardiac history on pt.  . Cancer ([place])    thyroid  . Carotid artery occlusion   . Chronic kidney disease    denies  . Constipation   . COPD (chronic obstructive pulmonary disease) ([place])   . Diabetes mellitus without complication ([place])   . Dyspnea   . GERD (gastroesophageal reflux disease)   . Headache    migraines  . History of thyroid cancer 2008  . History of thyroid cancer   . Hypertension   . OSA (obstructive sleep apnea)   . Pneumonia   . Restless leg   . S/P tendon repair    left thumb  . Sleep apnea with use of continuous positive airway pressure (CPAP)    2011 [facility] , AHI  77cn central and obstrcutive. 16 cm water , 3 cm EPR.   . Thoracic ascending aortic aneurysm (HCC)    4.2 cm ascending TAA 08/18/17 CTA. Annual imaging recommended.  . Thyroid disease   . Ulcer    Peptic ulcer disease    Past Surgical History:  Procedure Laterality Date  . ANTERIOR CERVICAL DECOMP/DISCECTOMY FUSION N/A 09/03/2017   Procedure: ACDF - C3-C4 - C4-C5 - C5-C6;   Surgeon: Kary Kos, MD;  Location: [facility];  Service: Neurosurgery;  Laterality: N/A;  . CAROTID ENDARTERECTOMY Left January 03, 2006   Dr. Amedeo Plenty  . COLONOSCOPY    . EYE SURGERY Bilateral    cataract surgery with lens implants  . game keepers thumb Right   . JOINT REPLACEMENT Left    knee X 2  . ROTATOR CUFF REPAIR Right   . THYROIDECTOMY  2008    There were no vitals filed for this visit.  Subjective Assessment - 04/29/18 1342    Subjective  Feels that his energy is returning slowly.  Did stretching, hand exercises and standing on Saturday, only stood once on Sunday.  Felt that his legs were more jumpy and weak on Sunday.    Patient is accompained by:  Family member   wife   Pertinent History  CAD, progressive muscular dystrophy, DM2, COPD, hypoxic respiratory failure, HTN,    Patient Stated Goals  To walk in home & community, wife wants him to be able to care for himself if something happens to her    Currently in Pain?  No/denies    Pain Onset  More than a month ago  [facility] PT Treatment/Exercise - 04/29/18 1344      Transfers   Transfers  Sit to Stand;Stand to Sit    Sit to Stand  4: Min assist    Sit to Stand Details (indicate cue type and reason)  from w/c after performing floor bike and seated LE extensions with pt performing with light min A, initiating sit > stand with UE but completing and coming to full stand through LE extension.  Once in standing performed static standing with focus on maintaining upright trunk and LE extension while releasing RW for 6 seconds and then 4 seconds at a time.      Exercises   Exercises  Knee/Hip      Knee/Hip Exercises: Aerobic   Other Aerobic  Floor bike seated in wheelchair problem solving best set up for home to maintain foot placement on pedals.  Pt able to perform 5 minutes forwards and backwards with shoes doffed and extra heel strap added to maintain foot placement when he experienced flexor withdrawal.   Cues to minimize trunk extension in order to prevent transport chair from tipping      Knee/Hip Exercises: Seated   Other Seated Knee/Hip Exercises  Seated LE resisted extension with blue theraband under foot, pulling LE into flexion and pt pushing down into band to initiate hip and knee extension, performed with each LE             PT Education - 04/29/18 1351    Education Details  added floor bike and resisted LE extension in sitting as dynamic warm up before standing    Person(s) Educated  Patient;Spouse    Methods  Explanation;Demonstration;Handout    Comprehension  Verbalized understanding;Returned demonstration       PT Short Term Goals - 04/15/18 1310      PT SHORT TERM GOAL #1   Title  Patient & wife demonstrate & verbalize understanding of updated HEP. (All updated STGs Target Date: 04/15/2018)    Baseline  04/15/18 Met with current HEP but not with updated exercises from session last week.    Status  Partially Met      PT SHORT TERM GOAL #2   Title  Patient performs scooting transfers right & left between level surfaces with supervision.    Baseline  03/11/2018 MET    Status  Achieved      PT SHORT TERM GOAL #3   Title  Patient sit to/from supine with patient minimal assist with LE movements.     Baseline  MET 04/10/2018    Status  Achieved      PT SHORT TERM GOAL #4   Title  Sit to/from stand w/c to sink 3 reps with minimal guard.     Baseline  Met 04/15/18.    Status  Achieved      PT SHORT TERM GOAL #5   Title  Patient ambulates 59' with RW with moderate assist.     Baseline  04/15/18 Pt ambulates 17' with RW Mod. A    Status  Partially Met        PT Long Term Goals - 04/29/18 1053      PT LONG TERM GOAL #1   Title  Patient & wife demonstrate & verbalize understanding of ongoing exercise / fitness plan including community programs. (All LTGs 05/13/18)    Time  3    Period  Months    Status  On-going    Target Date  05/13/18  PT LONG TERM  GOAL #2   Title  w/c to bed (mat at similar ht to his bed) scooting or squat pivot transfer modified independent.    Time  3    Period  Months    Status  On-going    Target Date  05/13/18      PT LONG TERM GOAL #3   Title  Sit to/from supine modified independent.    Time  3    Period  Months    Status  On-going    Target Date  05/13/18      PT LONG TERM GOAL #4   Title  Sit to/from stand chairs with armrests to RW with supervision.    Time  3    Period  Months    Status  On-going    Target Date  05/13/18      PT LONG TERM GOAL #5   Title  Patient standing balance with RW with BUE support staticly 2 minutes, scans environment and reaches within length of arm for objects with supervision.     Time  3    Period  Months    Status  On-going    Target Date  05/13/18      PT LONG TERM GOAL #6   Title  Patient ambulates 32' with RW with minA.     Time  3    Period  Months    Status  On-going    Target Date  05/13/18            Plan - 04/29/18 1351    Clinical Impression Statement  Continued to discuss barriers to performing HEP and standing program at home.  Discussed and trialed use of floor bike and resisted LE extensions as dynamic warm up to standing to decrease LE flexion spasms and improve activation of extensors during standing.  Pt tolerated well and demonstrated improved standing and transfers during OT session following PT session.  Will continue to progress towards LTG.    Clinical Impairments Affecting Rehab Potential  weakness & ROM impaired 4 extremities & trunk; dependency transfers, bed mobility, standing & gait    PT Frequency  2x / week    PT Duration  Other (comment)   13 weeks, 90 days   PT Treatment/Interventions  ADLs/Self Care Home Management;Canalith Repostioning;Moist Heat;DME Instruction;Gait training;Stair training;Functional mobility training;Therapeutic activities;Therapeutic exercise;Balance training;Neuromuscular re-education;Patient/family  education;Manual techniques;Passive range of motion;Vestibular    PT Next Visit Plan  appropriate for [place]?  sit <> stand from wedge on mat focusing on anterior pelvic tilt, stand pivot with RW, weight shifting to R in sitting and standing reaching up and to the side, reviewing updated HEP.  Gait and transfers with RW.    PT Home Exercise Plan  Access Code: [email or web address]     Consulted and Agree with Plan of Care  Patient;Family member/caregiver    Family Member Consulted  wife       Patient will benefit from skilled therapeutic intervention in order to improve the following deficits and impairments:  Abnormal gait, Cardiopulmonary status limiting activity, Decreased activity tolerance, Decreased balance, Decreased endurance, Decreased knowledge of use of DME, Decreased mobility, Decreased range of motion, Decreased strength, Difficulty walking, Dizziness, Impaired flexibility, Impaired UE functional use, Postural dysfunction  Visit Diagnosis: Abnormal posture  Unsteadiness on feet  Other abnormalities of gait and mobility     Problem List Patient Active Problem List   Diagnosis Date Noted  . Myelopathy ([place]) 09/03/2017  . Acute respiratory  failure with hypoxemia ([place]) 08/18/2017  . Hypokalemia 08/18/2017  . Leukocytosis 08/18/2017  . Muscle atrophy of lower extremity 08/09/2017  . Bowel and bladder incontinence 08/09/2017  . Right-sided muscle weakness 08/09/2017  . Neuropathy 08/09/2017  . 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/2017  . Abnormality of gait 07/12/2017  . Myoclonic jerkings, 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/2017  . Acquired right foot drop 07/12/2017  . UTI (urinary tract infection) due to Enterococcus 07/12/2017  . Pressure injury of skin 06/21/2017  . Acute cystitis 06/20/2017  . Generalized weakness 06/20/2017  . Dehydration 06/20/2017  . Dyspnea 12/26/2016  . Chronic cough 12/26/2016  . Hypersomnia with sleep apnea 10/30/2016  . CSA (central sleep apnea)  10/30/2016  . Wheezing symptom 10/30/2016  . CPAP/BiPAP dependent 07/01/2015  . 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 RLS (restless legs syndrome) 08/25/2014  . Primary gout 08/25/2014  . OSA on CPAP 08/25/2014  . Severe obesity (BMI >= 40) ([place]) 08/25/2014  . Obesity (BMI 30-39.9) 02/18/2013  . Sleep apnea with use of continuous positive airway pressure (CPAP)   . 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 S/P TKR (total knee replacement) 11/29/2012  . History of thyroid cancer     10th Visit Physical Therapy Progress Note  Dates of Reporting Period: 03/20/2018 to 04/29/2018  Objective Reports: See above  Objective Measurements: See above  Goal Update: Progressing towards LTG  Plan: Continue POC and plan to recertify for more visits in a couple weeks  Reason Skilled Services are Required: To continue to address impairments in LE strength, tone, posture, postural control, balance, transfers and gait to maximize functional mobility independence, decrease burden of care and decrease falls risk.  Rico Junker, PT, DPT 04/29/18    2:17 PM   [facility] [address] [place], [place], [address] Phone: [phone]   Fax:  [phone]  Name: [name] MRN: [id number] Date of Birth: [birth year]

## 2018-04-29 NOTE — Patient Instructions (Signed)
Access Code: [email or web address]  URL: [email or web address]  Date: 04/29/2018  Prepared by: Misty Stanley   Exercises  Supine Bridge - 10 reps - 1 sets - 5 hold - 1x daily - 5x weekly  Hooklying Single Leg Bent Knee Fallouts with Resistance - 10 reps - 1 sets - 1x daily - 5x weekly  Seated Knee Extension with Resistance - 10 reps - 1 sets - 1x daily - 5x weekly  Seated Hamstring Stretch with Chair - 10 reps - 6 second hold - 1x daily - 5x weekly  Prone Knee Flexion - 4 reps - 30 second hold - 1x daily - 7x weekly  Prone Gluteal Sets - 10 reps - 2 sets - 1x daily - 7x weekly  Supine Quadriceps Stretch with Strap on Table - 1-3 reps - 1 sets - 30-60 seconds hold - 1x daily - 5x weekly  Supine Quadricep Sets - 10 reps - 2 sets - 1x daily - 7x weekly  Modified Thomas Stretch - 1-3 reps - 1 sets - 330-60 seconds hold - 1x daily - 5x weekly  Seated Chair Push Ups - 10 reps - 1 sets - 3-5 hold - 1x daily - 5x weekly  Seated Trunk Rotation with Arms Crossed - 3-5 reps - 1 sets - 5 seconds hold - 1x daily - 5x weekly  Seated Eccentric Abdominal Lean Back - 3-5 reps - 1 sets - 5 seconds hold - 1x daily - 5x weekly  Sidebending to Elbow Short Sit - 3-5 reps - 1 sets - 5 seconds hold - 1x daily - 5x weekly  Recumbent Bike - 5 minutes hold - 2x daily - 7x weekly  Seated Leg Extension with Resistance - 10 reps - 1 sets - 2x daily - 7x weekly

## 2018-05-01 ENCOUNTER — Encounter: Payer: Self-pay | Admitting: Occupational Therapy

## 2018-05-01 ENCOUNTER — Ambulatory Visit: Payer: Medicare Other | Admitting: Physical Therapy

## 2018-05-01 ENCOUNTER — Encounter: Payer: Self-pay | Admitting: Physical Therapy

## 2018-05-01 ENCOUNTER — Ambulatory Visit: Payer: Medicare Other | Admitting: Occupational Therapy

## 2018-05-01 DIAGNOSIS — R293 Abnormal posture: Secondary | ICD-10-CM

## 2018-05-01 DIAGNOSIS — M6281 Muscle weakness (generalized): Secondary | ICD-10-CM

## 2018-05-01 DIAGNOSIS — R29818 Other symptoms and signs involving the nervous system: Secondary | ICD-10-CM

## 2018-05-01 DIAGNOSIS — R2689 Other abnormalities of gait and mobility: Secondary | ICD-10-CM

## 2018-05-01 DIAGNOSIS — R278 Other lack of coordination: Secondary | ICD-10-CM

## 2018-05-01 DIAGNOSIS — M25612 Stiffness of left shoulder, not elsewhere classified: Secondary | ICD-10-CM

## 2018-05-01 DIAGNOSIS — R2681 Unsteadiness on feet: Secondary | ICD-10-CM

## 2018-05-01 DIAGNOSIS — R29898 Other symptoms and signs involving the musculoskeletal system: Secondary | ICD-10-CM

## 2018-05-01 NOTE — Therapy (Signed)
[facility] [address] [place], [place], [address] Phone: [phone]   Fax:  [phone]  Occupational Therapy Treatment  Patient Details  Name: [name] MRN: [id number] Date of Birth: 03/29/1941 No data recorded  Encounter Date: 05/01/2018  OT End of Session - 05/01/18 1255    Visit Number  10    Number of Visits  16    Date for OT Re-Evaluation  05/21/18    Authorization Type  blue medicare will need PN every 10th visit    Authorization Time Period  90    Authorization - Visit Number  10    Authorization - Number of Visits  20    OT Start Time  1147    OT Stop Time  1230    OT Time Calculation (min)  43 min    Activity Tolerance  Patient tolerated treatment well    Behavior During Therapy  WFL for tasks assessed/performed       Past Medical History:  Diagnosis Date  . Arthritis   . Barrett esophagus   . Bronchitis   . CAD (coronary artery disease)    pt's wife denies any cardiac history on pt.  . Cancer ([facility])    thyroid  . Carotid artery occlusion   . Chronic kidney disease    denies  . Constipation   . COPD (chronic obstructive pulmonary disease) ([facility])   . Diabetes mellitus without complication ([facility])   . Dyspnea   . GERD (gastroesophageal reflux disease)   . Headache    migraines  . History of thyroid cancer 2008  . History of thyroid cancer   . Hypertension   . OSA (obstructive sleep apnea)   . Pneumonia   . Restless leg   . S/P tendon repair    left thumb  . Sleep apnea with use of continuous positive airway pressure (CPAP)    [facility] , AHI  77cn central and obstrcutive. 16 cm water , 3 cm EPR.   . Thoracic ascending aortic aneurysm (HCC)    4.2 cm ascending TAA 08/18/17 CTA. Annual imaging recommended.  . Thyroid disease   . Ulcer    Peptic ulcer disease    Past Surgical History:  Procedure Laterality Date  . ANTERIOR CERVICAL DECOMP/DISCECTOMY FUSION N/A 09/03/2017    Procedure: ACDF - C3-C4 - C4-C5 - C5-C6;  Surgeon: Cram, Gary, MD;  Location: [facility];  Service: Neurosurgery;  Laterality: N/A;  . CAROTID ENDARTERECTOMY Left January 03, 2006   Dr. Hayes  . COLONOSCOPY    . EYE SURGERY Bilateral    cataract surgery with lens implants  . game keepers thumb Right   . JOINT REPLACEMENT Left    knee X 2  . ROTATOR CUFF REPAIR Right   . THYROIDECTOMY  2008    There were no vitals filed for this visit.  Subjective Assessment - 05/01/18 1151    Subjective   The morning is my biggest challenge    Pertinent History  ACDF C3- C6 09/03/2017. Pt began with LE weakness 05/2017.   Pt has been wheelchair bound starting in 06/2017. Pt in SNF for a month, then went home.  Pt then had ACDF C3- C6 09/03/2017 pt then d/c to SNF until 11/29/2017 for PT, OT and ST.  Pt discharged home with HHPT and OT which ended right before pt started with PT here in this clinic.  PMH:    Patient Stated Goals  get my   arms working better and move better    Currently in Pain?  No/denies    Pain Score  0-No pain                   OT Treatments/Exercises (OP) - 05/01/18 0001      ADLs   LB Dressing  Talked with patient and wife about barriers to dressing himself with less assistance.  Patient and wife agreed that wife encourages independence, but paid caregiver is assisting.  Encouraged them to discuss with caregiver the benefits of allowing patient to complete - and to set parameters- e.g. let me try for x # minutes and then help me if I am stuck.  Patient also felt that sitting on edge of bed is too challenging and supportive surface would allow him greater independence.  Patient plans to sit on large coffee table to dress himself in the morning.        Neurological Re-education Exercises   Reciprocal Movements  Neuro reeducation to address sit to stand with decreased relaince on UE's to pull, and rolling with increased emphasis on upper trunk rotation, upper extremity cross body  reaching.  Patient needs constant cueing regarding breathing during movement.  Worked specifically to encourage lower trunk initiated weight shift to transition from side sit to short sit.               OT Education - 05/01/18 1255    Education Details  reinforced activity program to increase particiaption / independence with ADL    Person(s) Educated  Patient;Spouse    Methods  Explanation    Comprehension  Verbalized understanding       OT Short Term Goals - 05/01/18 1302      OT SHORT TERM GOAL #1   Title  Pt and wife will be mod I with UE home activities program focused on UE strenthening, coordination and UE functional use (BUE's) - 04/23/2018    Status  Achieved      OT SHORT TERM GOAL #2   Title  Pt will demonstrate ability for active L shoulder flexion in supine to 125* without pain in prep for overhead functional reach.     Status  Achieved      OT SHORT TERM GOAL #3   Title  Pt will demonstrate ability to pick up light weight object with LUE at mid reach (75* of shoulder flexion).      Status  Achieved      OT SHORT TERM GOAL #4   Title  Pt will demonstrate ability to use reacher to don pants over feet in sitting with min a.    Status  Achieved      OT SHORT TERM GOAL #5   Title  Patient will demonstrate improved coordination as evidenced by reduced time on 9 hole peg test by 10 seconds each hand - to improve ability to manipulate small objects - pills, buttons, etc.      Baseline  right 1 min 19 sec (10/16)   left 58 sec (10/16)    Status  Achieved      OT SHORT TERM GOAL #6   Title  Pt will demonstate improved grip strength by at least 5 pounds total to assist with ADL tasks. (baseline L= 45, R =38)    Status  Not Met        OT Long Term Goals - 04/29/18 1247      OT LONG TERM GOAL #1   Title  Pt and wife will be mod I with upgraded home activities program - 05/21/2018    Status  On-going      OT LONG TERM GOAL #2   Title  Pt will demonstrate ability  to comb hair mod I    Status  Achieved      OT LONG TERM GOAL #3   Title  Pt will be min a for simple hot familiar meal prep    Status  On-going      OT LONG TERM GOAL #4   Title  Pt will be mod I using reacher to don pants over feet.     Status  Achieved      OT LONG TERM GOAL #5   Title  Pt will demonstrate improved grip strength by at least 7 pounds total to assist with simple cooking tasks (baseline L= 45, R = 38).    Status  On-going      OT LONG TERM GOAL #6   Title  Pt will verbalize understanding of adapted techniques for buttoning and zipping prn    Status  Achieved      OT LONG TERM GOAL #7   Title  Pt will be able to lift light weight object at mid reach during simple cooking tasks with LUE    Status  On-going      OT LONG TERM GOAL #8   Title  Pt will be able to lift 2 pound object bilaterally from mid reach to assist with functional activities at home.     Status  On-going            Plan - 05/01/18 1256    Clinical Impression Statement  Patient has met all but one STG, and is working toward long term goals.  OT is reinforcing more consistent performance with Home activitiy / exercise program to increase independence with ADL/ functional mobility.      Occupational Profile and client history currently impacting functional performance  husband, friend, reitiree.  PMH:  CAD, DM, COPD, hypoxic respiratory failure, HTN    Occupational performance deficits (Please refer to evaluation for details):  ADL's;IADL's;Leisure;Social Participation;Rest and Sleep    Rehab Potential  Fair    Current Impairments/barriers affecting progress:  length of time since onset, severity of deficits    OT Frequency  2x / week    OT Duration  8 weeks    OT Treatment/Interventions  Self-care/ADL training;Moist Heat;Therapeutic exercise;Neuromuscular education;DME and/or AE instruction;Passive range of motion;Manual Therapy;Functional Mobility Training;Therapeutic activities;Patient/family  education;Balance training    Plan  NMR sit to stand supine to sit, standing weight shift, decreased relaince on UE's, mid functional reach.      Clinical Decision Making  Multiple treatment options, significant modification of task necessary    Consulted and Agree with Plan of Care  Patient;Family member/caregiver    Family Member Consulted  wife       Patient will benefit from skilled therapeutic intervention in order to improve the following deficits and impairments:  Decreased activity tolerance, Decreased balance, Decreased coordination, Decreased range of motion, Decreased mobility, Decreased knowledge of use of DME, Decreased strength, Difficulty walking, Impaired UE functional use, Impaired sensation, Impaired flexibility, Pain  Visit Diagnosis: Unsteadiness on feet  Abnormal posture  Muscle weakness (generalized)  Stiffness of left shoulder, not elsewhere classified  Other lack of coordination    Problem List Patient Active Problem List   Diagnosis Date Noted  . Myelopathy ([facility]) 09/03/2017  . Acute respiratory failure with   hypoxemia ([place]) 08/18/2017  . Hypokalemia 08/18/2017  . Leukocytosis 08/18/2017  . Muscle atrophy of lower extremity 08/09/2017  . Bowel and bladder incontinence 08/09/2017  . Right-sided muscle weakness 08/09/2017  . Neuropathy 08/09/2017  . 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/2017  . Abnormality of gait 07/12/2017  . Myoclonic jerkings, 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/2017  . Acquired right foot drop 07/12/2017  . UTI (urinary tract infection) due to Enterococcus 07/12/2017  . Pressure injury of skin 06/21/2017  . Acute cystitis 06/20/2017  . Generalized weakness 06/20/2017  . Dehydration 06/20/2017  . Dyspnea 12/26/2016  . Chronic cough 12/26/2016  . Hypersomnia with sleep apnea 10/30/2016  . CSA (central sleep apnea) 10/30/2016  . Wheezing symptom 10/30/2016  . CPAP/BiPAP dependent 07/01/2015  . Complex sleep apnea syndrome  07/01/2015  . RLS (restless legs syndrome) 08/25/2014  . Primary gout 08/25/2014  . OSA on CPAP 08/25/2014  . Severe obesity (BMI >= 40) ([place]) 08/25/2014  . Obesity (BMI 30-39.9) 02/18/2013  . Sleep apnea with use of continuous positive airway pressure (CPAP)   . 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 S/P TKR (total knee replacement) 11/29/2012  . History of thyroid cancer   Occupational Therapy Progress Note  Dates of Reporting Period: 03/26/18 to 05/01/18     Mariah Milling, OTR/L 05/01/2018, 1:03 PM  [facility] [address] [place], [place], [address] Phone: [phone]   Fax:  [phone]  Name: MATHEUS SPIKER MRN: [id number] Date of Birth: 14-Apr-1941

## 2018-05-02 NOTE — Therapy (Signed)
[facility] [address] [place], [place], [address] Phone: [phone]   Fax:  [phone]  Physical Therapy Treatment  Patient Details  Name: [name] MRN: [id number] Date of Birth: 05-19-1941 Referring Provider (PT): Larey Seat, MD   Encounter Date: 05/01/2018  PT End of Session - 05/01/18 1800    Visit Number  21    Number of Visits  26    Date for PT Re-Evaluation  05/13/18    Authorization Type  [employer] Pt has met $5900 oop, full coverage, 10th visit PN    PT Start Time  1100    PT Stop Time  1145    PT Time Calculation (min)  45 min    Activity Tolerance  Patient tolerated treatment well    Behavior During Therapy  [facility] for tasks assessed/performed       Past Medical History:  Diagnosis Date  . Arthritis   . Barrett esophagus   . Bronchitis   . CAD (coronary artery disease)    pt's wife denies any cardiac history on pt.  . Cancer ([place])    thyroid  . Carotid artery occlusion   . Chronic kidney disease    denies  . Constipation   . COPD (chronic obstructive pulmonary disease) ([place])   . Diabetes mellitus without complication ([place])   . Dyspnea   . GERD (gastroesophageal reflux disease)   . Headache    migraines  . History of thyroid cancer 2008  . History of thyroid cancer   . Hypertension   . OSA (obstructive sleep apnea)   . Pneumonia   . Restless leg   . S/P tendon repair    left thumb  . Sleep apnea with use of continuous positive airway pressure (CPAP)    [facility] , AHI  77cn central and obstrcutive. 16 cm water , 3 cm EPR.   . Thoracic ascending aortic aneurysm (HCC)    4.2 cm ascending TAA 08/18/17 CTA. Annual imaging recommended.  . Thyroid disease   . Ulcer    Peptic ulcer disease    Past Surgical History:  Procedure Laterality Date  . ANTERIOR CERVICAL DECOMP/DISCECTOMY FUSION N/A 09/03/2017   Procedure: ACDF - C3-C4 - C4-C5 - C5-C6;  Surgeon: Kary Kos,  MD;  Location: [facility];  Service: Neurosurgery;  Laterality: N/A;  . CAROTID ENDARTERECTOMY Left January 03, 2006   Dr. Amedeo Plenty  . COLONOSCOPY    . EYE SURGERY Bilateral    cataract surgery with lens implants  . game keepers thumb Right   . JOINT REPLACEMENT Left    knee X 2  . ROTATOR CUFF REPAIR Right   . THYROIDECTOMY  2008    There were no vitals filed for this visit.  Subjective Assessment - 05/01/18 1107    Subjective  He is doing "some" of his exercises. they have found a good place to stretch hip flexors as PT taught.     Patient Stated Goals  To walk in home & community, wife wants him to be able to care for himself if something happens to her    Currently in Pain?  No/denies                       [facility] Adult PT Treatment/Exercise - 05/01/18 1100      Transfers   Transfers  Sit to Stand;Stand to Sit    Sit to Stand  3: Mod assist;4: Min  assist;With upper extremity assist;With armrests;From chair/3-in-1;From elevated surface;Other (comment)   to RW modA & to sink MinA   Sit to Stand Details  Tactile cues for weight shifting;Visual cues/gestures for sequencing;Verbal cues for technique;Verbal cues for safe use of DME/AE    Sit to Stand Details (indicate cue type and reason)  PT demo how higher/elevated surfaces enable patient to perform more of tasks.     Stand to Sit  4: Min guard;With upper extremity assist;With armrests;To chair/3-in-1;To elevated surface   from sink & from RW   Stand to Sit Details (indicate cue type and reason)  Tactile cues for weight shifting;Visual cues for safe use of DME/AE;Verbal cues for technique      Ambulation/Gait   Ambulation/Gait  Yes    Ambulation/Gait Assistance  3: Mod assist    Ambulation/Gait Assistance Details  at end of session after multiple stands at sink facilitating upright posture.  2nd person assist (only contact assist) for safety. PT manual/tactile cues on upright posture & increased step length.     Ambulation  Distance (Feet)  25 Feet    Assistive device  Rolling walker    Gait Pattern  Step-to pattern;Decreased step length - left;Decreased stride length;Right foot flat;Left foot flat;Right flexed knee in stance;Left flexed knee in stance;Trunk flexed;Poor foot clearance - left;Poor foot clearance - right    Ambulation Surface  Indoor;Level      Therapeutic Activites    Other Therapeutic Activities  sitting at front edge of w/c with feet on floor & no back support: verbal cues on upright posture.   Sitting on 24" stool without back support, UEs on thighs & feet on floor working on trunk upright posture. Sit to/from stand 24" stool to sink & standing upright with BUE support on sink initially 15 seconds & progressed to 35 seconds.        Exercises   Exercises  --      Knee/Hip Exercises: Aerobic   Other Aerobic  --      Knee/Hip Exercises: Seated   Other Seated Knee/Hip Exercises  --             PT Education - 05/01/18 1114    Education Details  sitting at front edge of chair upright without back or arm support working on increasing time,  standing at sink with wife and sitting on 24" stool with sit/stand    Person(s) Educated  Patient;Spouse    Methods  Explanation;Demonstration;Verbal cues;Tactile cues    Comprehension  Verbalized understanding;Verbal cues required;Need further instruction       PT Short Term Goals - 04/15/18 1310      PT SHORT TERM GOAL #1   Title  Patient & wife demonstrate & verbalize understanding of updated HEP. (All updated STGs Target Date: 04/15/2018)    Baseline  04/15/18 Met with current HEP but not with updated exercises from session last week.    Status  Partially Met      PT SHORT TERM GOAL #2   Title  Patient performs scooting transfers right & left between level surfaces with supervision.    Baseline  03/11/2018 MET    Status  Achieved      PT SHORT TERM GOAL #3   Title  Patient sit to/from supine with patient minimal assist with LE movements.      Baseline  MET 04/10/2018    Status  Achieved      PT SHORT TERM GOAL #4   Title  Sit to/from stand  w/c to sink 3 reps with minimal guard.     Baseline  Met 04/15/18.    Status  Achieved      PT SHORT TERM GOAL #5   Title  Patient ambulates 75' with RW with moderate assist.     Baseline  04/15/18 Pt ambulates 17' with RW Mod. A    Status  Partially Met        PT Long Term Goals - 04/29/18 1053      PT LONG TERM GOAL #1   Title  Patient & wife demonstrate & verbalize understanding of ongoing exercise / fitness plan including community programs. (All LTGs 05/13/18)    Time  3    Period  Months    Status  On-going    Target Date  05/13/18      PT LONG TERM GOAL #2   Title  w/c to bed (mat at similar ht to his bed) scooting or squat pivot transfer modified independent.    Time  3    Period  Months    Status  On-going    Target Date  05/13/18      PT LONG TERM GOAL #3   Title  Sit to/from supine modified independent.    Time  3    Period  Months    Status  On-going    Target Date  05/13/18      PT LONG TERM GOAL #4   Title  Sit to/from stand chairs with armrests to RW with supervision.    Time  3    Period  Months    Status  On-going    Target Date  05/13/18      PT LONG TERM GOAL #5   Title  Patient standing balance with RW with BUE support staticly 2 minutes, scans environment and reaches within length of arm for objects with supervision.     Time  3    Period  Months    Status  On-going    Target Date  05/13/18      PT LONG TERM GOAL #6   Title  Patient ambulates 29' with RW with minA.     Time  3    Period  Months    Status  On-going    Target Date  05/13/18            Plan - 05/01/18 1155    Clinical Impression Statement  Patient improved sit to stand from 24" stool to sink engaging LE muscles more for motion and improving upright posture. Sitting without back support at edge of w/c & 24" stool near sink with wife or CNA would be benefical.      Clinical Impairments Affecting Rehab Potential  weakness & ROM impaired 4 extremities & trunk; dependency transfers, bed mobility, standing & gait    PT Frequency  2x / week    PT Duration  Other (comment)   13 weeks, 90 days   PT Treatment/Interventions  ADLs/Self Care Home Management;Canalith Repostioning;Moist Heat;DME Instruction;Gait training;Stair training;Functional mobility training;Therapeutic activities;Therapeutic exercise;Balance training;Neuromuscular re-education;Patient/family education;Manual techniques;Passive range of motion;Vestibular    PT Next Visit Plan  appropriate for [place]?  sit <> stand from 24" stool to sink work with wife being able to assist with standing at sink, reviewing updated HEP.  Gait and transfers with RW.    PT Home Exercise Plan  Access Code: [email or web address]     Consulted and Agree with Plan of Care  Patient;Family member/caregiver  Family Member Consulted  wife       Patient will benefit from skilled therapeutic intervention in order to improve the following deficits and impairments:  Abnormal gait, Cardiopulmonary status limiting activity, Decreased activity tolerance, Decreased balance, Decreased endurance, Decreased knowledge of use of DME, Decreased mobility, Decreased range of motion, Decreased strength, Difficulty walking, Dizziness, Impaired flexibility, Impaired UE functional use, Postural dysfunction  Visit Diagnosis: Unsteadiness on feet  Abnormal posture  Muscle weakness (generalized)  Other symptoms and signs involving the nervous system  Other symptoms and signs involving the musculoskeletal system  Other abnormalities of gait and mobility     Problem List Patient Active Problem List   Diagnosis Date Noted  . 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 Acute respiratory failure with hypoxemia ([place]) 08/18/2017  . Hypokalemia 08/18/2017  . Leukocytosis 08/18/2017  . Muscle atrophy of lower extremity 08/09/2017  . Bowel and bladder incontinence  08/09/2017  . Right-sided muscle weakness 08/09/2017  . Neuropathy 08/09/2017  . 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/2017  . Abnormality of gait 07/12/2017  . Myoclonic jerkings, 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/2017  . Acquired right foot drop 07/12/2017  . UTI (urinary tract infection) due to Enterococcus 07/12/2017  . Pressure injury of skin 06/21/2017  . Acute cystitis 06/20/2017  . Generalized weakness 06/20/2017  . Dehydration 06/20/2017  . Dyspnea 12/26/2016  . Chronic cough 12/26/2016  . Hypersomnia with sleep apnea 10/30/2016  . CSA (central sleep apnea) 10/30/2016  . Wheezing symptom 10/30/2016  . CPAP/BiPAP dependent 07/01/2015  . 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 RLS (restless legs syndrome) 08/25/2014  . Primary gout 08/25/2014  . OSA on CPAP 08/25/2014  . Severe obesity (BMI >= 40) ([place]) 08/25/2014  . Obesity (BMI 30-39.9) 02/18/2013  . Sleep apnea with use of continuous positive airway pressure (CPAP)   . 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 S/P TKR (total knee replacement) 11/29/2012  . History of thyroid cancer     Tayvian Holycross PT, DPT 05/02/2018, 11:42 AM  [facility] [address] [place], [place], [address] Phone: [phone]   Fax:  [phone]  Name: [name] MRN: [id number] Date of Birth: 1940/12/22

## 2018-05-06 ENCOUNTER — Encounter: Payer: Self-pay | Admitting: Physical Therapy

## 2018-05-06 ENCOUNTER — Ambulatory Visit: Payer: Medicare Other | Admitting: Physical Therapy

## 2018-05-06 DIAGNOSIS — R2681 Unsteadiness on feet: Secondary | ICD-10-CM

## 2018-05-06 DIAGNOSIS — R29818 Other symptoms and signs involving the nervous system: Secondary | ICD-10-CM

## 2018-05-06 DIAGNOSIS — M6281 Muscle weakness (generalized): Secondary | ICD-10-CM

## 2018-05-06 DIAGNOSIS — M25612 Stiffness of left shoulder, not elsewhere classified: Secondary | ICD-10-CM

## 2018-05-06 DIAGNOSIS — R278 Other lack of coordination: Secondary | ICD-10-CM

## 2018-05-06 DIAGNOSIS — R293 Abnormal posture: Secondary | ICD-10-CM | POA: Diagnosis not present

## 2018-05-07 NOTE — Therapy (Signed)
[facility] [address] [place], [place], [address] Phone: [phone]   Fax:  [phone]  Physical Therapy Treatment  Patient Details  Name: [name] MRN: [id number] Date of Birth: 1940/10/16 Referring Provider (PT): Larey Seat, MD   Encounter Date: 05/06/2018  PT End of Session - 05/06/18 1800    Visit Number  22    Number of Visits  26    Date for PT Re-Evaluation  05/13/18    Authorization Type  [employer] Pt has met $5900 oop, full coverage, 10th visit PN    PT Start Time  1100    PT Stop Time  1145    PT Time Calculation (min)  45 min    Equipment Utilized During Treatment  Gait belt    Activity Tolerance  Patient tolerated treatment well    Behavior During Therapy  WFL for tasks assessed/performed       Past Medical History:  Diagnosis Date  . Arthritis   . Barrett esophagus   . Bronchitis   . CAD (coronary artery disease)    pt's wife denies any cardiac history on pt.  . Cancer ([place])    thyroid  . Carotid artery occlusion   . Chronic kidney disease    denies  . Constipation   . COPD (chronic obstructive pulmonary disease) ([place])   . Diabetes mellitus without complication ([place])   . Dyspnea   . GERD (gastroesophageal reflux disease)   . Headache    migraines  . History of thyroid cancer 2008  . History of thyroid cancer   . Hypertension   . OSA (obstructive sleep apnea)   . Pneumonia   . Restless leg   . S/P tendon repair    left thumb  . Sleep apnea with use of continuous positive airway pressure (CPAP)    [facility] , AHI  77cn central and obstrcutive. 16 cm water , 3 cm EPR.   . Thoracic ascending aortic aneurysm (HCC)    4.2 cm ascending TAA 08/18/17 CTA. Annual imaging recommended.  . Thyroid disease   . Ulcer    Peptic ulcer disease    Past Surgical History:  Procedure Laterality Date  . ANTERIOR CERVICAL DECOMP/DISCECTOMY FUSION N/A 09/03/2017   Procedure:  ACDF - C3-C4 - C4-C5 - C5-C6;  Surgeon: Kary Kos, MD;  Location: [facility];  Service: Neurosurgery;  Laterality: N/A;  . CAROTID ENDARTERECTOMY Left January 03, 2006   Dr. Amedeo Plenty  . COLONOSCOPY    . EYE SURGERY Bilateral    cataract surgery with lens implants  . game keepers thumb Right   . JOINT REPLACEMENT Left    knee X 2  . ROTATOR CUFF REPAIR Right   . THYROIDECTOMY  2008    There were no vitals filed for this visit.  Subjective Assessment - 05/06/18 1100    Subjective  He has not gotten a bar stool yet but they ordered it. They got leg ergometer and he was able to pedal for 10 minutes.     Patient is accompained by:  Family member    Pertinent History  CAD, progressive muscular dystrophy, DM2, COPD, hypoxic respiratory failure, HTN,    Patient Stated Goals  To walk in home & community, wife wants him to be able to care for himself if something happens to her    Pain Score  4     Pain Location  Shoulder    Pain Orientation  Right  Pain Descriptors / Indicators  Sharp    Pain Type  Chronic pain    Pain Onset  More than a month ago    Pain Frequency  Intermittent    Aggravating Factors   unknown    Pain Relieving Factors  tylenol, repositioning the arm    Multiple Pain Sites  No                       [facility] Adult PT Treatment/Exercise - 05/06/18 1100      Transfers   Transfers  Sit to Stand;Stand to Sit    Sit to Stand  3: Mod assist;4: Min assist;With upper extremity assist;With armrests;From chair/3-in-1;From elevated surface;Other (comment)   ModA initially & progressed to MinA   Sit to Stand Details  Tactile cues for weight shifting;Visual cues/gestures for sequencing;Verbal cues for technique;Verbal cues for safe use of DME/AE    Stand to Sit  4: Min guard;With upper extremity assist;With armrests;To chair/3-in-1;To elevated surface   from RW   Stand to Sit Details (indicate cue type and reason)  Tactile cues for weight shifting;Visual cues for safe use of  DME/AE;Verbal cues for technique      Ambulation/Gait   Ambulation/Gait  Yes    Ambulation/Gait Assistance  3: Mod assist   2 people assist for safety but 2nd person contact assist   Ambulation/Gait Assistance Details  Tactile & verbal cues on upright posture, step length & wt shift    Ambulation Distance (Feet)  15 Feet   15', 10' & 8'   Assistive device  Rolling walker    Gait Pattern  Step-to pattern;Decreased step length - left;Decreased stride length;Right foot flat;Left foot flat;Right flexed knee in stance;Left flexed knee in stance;Trunk flexed;Poor foot clearance - left;Poor foot clearance - right    Ambulation Surface  Indoor;Level      Therapeutic Activites    Other Therapeutic Activities  upper back extension stretch: seated with ball in lumbar region & upper back extension 3 reps 20 second hold      Lumbar Exercises: Stretches   Passive Hamstring Stretch  Right;Left;Both;3 reps;20 seconds    Passive Hamstring Stretch Limitations  seated at edge of w/c with knee extended             PT Education - 05/06/18 1100    Education Details  hamstring & upper back extension stretch    Person(s) Educated  Patient;Spouse    Methods  Explanation;Demonstration;Tactile cues;Verbal cues    Comprehension  Verbalized understanding;Returned demonstration;Verbal cues required;Tactile cues required;Need further instruction       PT Short Term Goals - 04/15/18 1310      PT SHORT TERM GOAL #1   Title  Patient & wife demonstrate & verbalize understanding of updated HEP. (All updated STGs Target Date: 04/15/2018)    Baseline  04/15/18 Met with current HEP but not with updated exercises from session last week.    Status  Partially Met      PT SHORT TERM GOAL #2   Title  Patient performs scooting transfers right & left between level surfaces with supervision.    Baseline  03/11/2018 MET    Status  Achieved      PT SHORT TERM GOAL #3   Title  Patient sit to/from supine with patient  minimal assist with LE movements.     Baseline  MET 04/10/2018    Status  Achieved      PT SHORT TERM GOAL #4  Title  Sit to/from stand w/c to sink 3 reps with minimal guard.     Baseline  Met 04/15/18.    Status  Achieved      PT SHORT TERM GOAL #5   Title  Patient ambulates 40' with RW with moderate assist.     Baseline  04/15/18 Pt ambulates 17' with RW Mod. A    Status  Partially Met        PT Long Term Goals - 04/29/18 1053      PT LONG TERM GOAL #1   Title  Patient & wife demonstrate & verbalize understanding of ongoing exercise / fitness plan including community programs. (All LTGs 05/13/18)    Time  3    Period  Months    Status  On-going    Target Date  05/13/18      PT LONG TERM GOAL #2   Title  w/c to bed (mat at similar ht to his bed) scooting or squat pivot transfer modified independent.    Time  3    Period  Months    Status  On-going    Target Date  05/13/18      PT LONG TERM GOAL #3   Title  Sit to/from supine modified independent.    Time  3    Period  Months    Status  On-going    Target Date  05/13/18      PT LONG TERM GOAL #4   Title  Sit to/from stand chairs with armrests to RW with supervision.    Time  3    Period  Months    Status  On-going    Target Date  05/13/18      PT LONG TERM GOAL #5   Title  Patient standing balance with RW with BUE support staticly 2 minutes, scans environment and reaches within length of arm for objects with supervision.     Time  3    Period  Months    Status  On-going    Target Date  05/13/18      PT LONG TERM GOAL #6   Title  Patient ambulates 41' with RW with minA.     Time  3    Period  Months    Status  On-going    Target Date  05/13/18            Plan - 05/06/18 1222    Clinical Impression Statement  Patient & wife appear to understand seated stretches for hamstrings. He is making process with standing & gait activities but will not meet LTGs by end of current certification period which  ends next week. Patient & wife would like to continue PT services.     Rehab Potential  Good    Clinical Impairments Affecting Rehab Potential  weakness & ROM impaired 4 extremities & trunk; dependency transfers, bed mobility, standing & gait    PT Frequency  2x / week    PT Duration  Other (comment)   13 weeks, 90 days   PT Treatment/Interventions  ADLs/Self Care Home Management;Canalith Repostioning;Moist Heat;DME Instruction;Gait training;Stair training;Functional mobility training;Therapeutic activities;Therapeutic exercise;Balance training;Neuromuscular re-education;Patient/family education;Manual techniques;Passive range of motion;Vestibular    PT Next Visit Plan  sit <> stand from 24" stool to sink work with wife being able to assist with standing at sink, reviewing updated HEP.  Gait and transfers with RW.    PT Home Exercise Plan  Access Code: [email or web address]     Consulted and  Agree with Plan of Care  Patient;Family member/caregiver    Family Member Consulted  wife       Patient will benefit from skilled therapeutic intervention in order to improve the following deficits and impairments:  Abnormal gait, Cardiopulmonary status limiting activity, Decreased activity tolerance, Decreased balance, Decreased endurance, Decreased knowledge of use of DME, Decreased mobility, Decreased range of motion, Decreased strength, Difficulty walking, Dizziness, Impaired flexibility, Impaired UE functional use, Postural dysfunction  Visit Diagnosis: Unsteadiness on feet  Abnormal posture  Muscle weakness (generalized)  Stiffness of left shoulder, not elsewhere classified  Other lack of coordination  Other symptoms and signs involving the nervous system     Problem List Patient Active Problem List   Diagnosis Date Noted  . 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 Acute respiratory failure with hypoxemia ([place]) 08/18/2017  . Hypokalemia 08/18/2017  . Leukocytosis 08/18/2017  . Muscle atrophy of lower  extremity 08/09/2017  . Bowel and bladder incontinence 08/09/2017  . Right-sided muscle weakness 08/09/2017  . Neuropathy 08/09/2017  . 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/2017  . Abnormality of gait 07/12/2017  . Myoclonic jerkings, 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/2017  . Acquired right foot drop 07/12/2017  . UTI (urinary tract infection) due to Enterococcus 07/12/2017  . Pressure injury of skin 06/21/2017  . Acute cystitis 06/20/2017  . Generalized weakness 06/20/2017  . Dehydration 06/20/2017  . Dyspnea 12/26/2016  . Chronic cough 12/26/2016  . Hypersomnia with sleep apnea 10/30/2016  . CSA (central sleep apnea) 10/30/2016  . Wheezing symptom 10/30/2016  . CPAP/BiPAP dependent 07/01/2015  . 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 RLS (restless legs syndrome) 08/25/2014  . Primary gout 08/25/2014  . OSA on CPAP 08/25/2014  . Severe obesity (BMI >= 40) ([place]) 08/25/2014  . Obesity (BMI 30-39.9) 02/18/2013  . Sleep apnea with use of continuous positive airway pressure (CPAP)   . 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 S/P TKR (total knee replacement) 11/29/2012  . History of thyroid cancer     Ardena Gangl PT, DPT 05/07/2018, 6:12 AM  [facility] [address] [place], [place], [address] Phone: [phone]   Fax:  [phone]  Name: JAYKUB MACKINS MRN: [id number] Date of Birth: 08/01/1940

## 2018-05-08 ENCOUNTER — Ambulatory Visit: Payer: Medicare Other | Admitting: Physical Therapy

## 2018-05-08 ENCOUNTER — Encounter: Payer: Self-pay | Admitting: Physical Therapy

## 2018-05-08 DIAGNOSIS — R29898 Other symptoms and signs involving the musculoskeletal system: Secondary | ICD-10-CM

## 2018-05-08 DIAGNOSIS — R293 Abnormal posture: Secondary | ICD-10-CM

## 2018-05-08 DIAGNOSIS — M6281 Muscle weakness (generalized): Secondary | ICD-10-CM

## 2018-05-08 DIAGNOSIS — R29818 Other symptoms and signs involving the nervous system: Secondary | ICD-10-CM

## 2018-05-08 DIAGNOSIS — R2681 Unsteadiness on feet: Secondary | ICD-10-CM

## 2018-05-08 DIAGNOSIS — R2689 Other abnormalities of gait and mobility: Secondary | ICD-10-CM

## 2018-05-09 NOTE — Therapy (Signed)
[facility] [address] [place], [place], [address] Phone: [phone]   Fax:  [phone]  Physical Therapy Treatment  Patient Details  Name: [name] MRN: [id number] Date of Birth: [birth year] Referring Provider (PT): Larey Seat, MD   Encounter Date: 05/08/2018  PT End of Session - 05/08/18 1200    Visit Number  23    Number of Visits  26    Date for PT Re-Evaluation  05/13/18    Authorization Type  [employer] Pt has met $5900 oop, full coverage, 10th visit PN    PT Start Time  1100    PT Stop Time  1145    PT Time Calculation (min)  45 min    Equipment Utilized During Treatment  Gait belt    Activity Tolerance  Patient tolerated treatment well    Behavior During Therapy  WFL for tasks assessed/performed       Past Medical History:  Diagnosis Date  . Arthritis   . Barrett esophagus   . Bronchitis   . CAD (coronary artery disease)    pt's wife denies any cardiac history on pt.  . Cancer ([place])    thyroid  . Carotid artery occlusion   . Chronic kidney disease    denies  . Constipation   . COPD (chronic obstructive pulmonary disease) ([place])   . Diabetes mellitus without complication ([place])   . Dyspnea   . GERD (gastroesophageal reflux disease)   . Headache    migraines  . History of thyroid cancer 2008  . History of thyroid cancer   . Hypertension   . OSA (obstructive sleep apnea)   . Pneumonia   . Restless leg   . S/P tendon repair    left thumb  . Sleep apnea with use of continuous positive airway pressure (CPAP)    [facility] , AHI  77cn central and obstrcutive. 16 cm water , 3 cm EPR.   . Thoracic ascending aortic aneurysm (HCC)    4.2 cm ascending TAA 08/18/17 CTA. Annual imaging recommended.  . Thyroid disease   . Ulcer    Peptic ulcer disease    Past Surgical History:  Procedure Laterality Date  . ANTERIOR CERVICAL DECOMP/DISCECTOMY FUSION N/A 09/03/2017   Procedure:  ACDF - C3-C4 - C4-C5 - C5-C6;  Surgeon: Kary Kos, MD;  Location: [facility];  Service: Neurosurgery;  Laterality: N/A;  . CAROTID ENDARTERECTOMY Left January 03, 2006   Dr. Amedeo Plenty  . COLONOSCOPY    . EYE SURGERY Bilateral    cataract surgery with lens implants  . game keepers thumb Right   . JOINT REPLACEMENT Left    knee X 2  . ROTATOR CUFF REPAIR Right   . THYROIDECTOMY  2008    There were no vitals filed for this visit.  Subjective Assessment - 05/08/18 1107    Subjective  They got bar stool and worked on standing at sink at PT instructed. CNA was present to help switch w/c & stool for safety. He is also using pedal bike.     Pertinent History  CAD, progressive muscular dystrophy, DM2, COPD, hypoxic respiratory failure, HTN,    Patient Stated Goals  To walk in home & community, wife wants him to be able to care for himself if something happens to her    Currently in Pain?  No/denies  [facility] Adult PT Treatment/Exercise - 05/08/18 1100      Transfers   Transfers  Sit to Stand;Stand to Sit    Sit to Stand  3: Mod assist;4: Min assist;With upper extremity assist;With armrests;From chair/3-in-1;From elevated surface;Other (comment)   ModA initially & progressed to MinA   Sit to Stand Details  Tactile cues for weight shifting;Visual cues/gestures for sequencing;Verbal cues for technique;Verbal cues for safe use of DME/AE    Stand to Sit  4: Min guard;With upper extremity assist;With armrests;To chair/3-in-1;To elevated surface   from RW   Stand to Sit Details (indicate cue type and reason)  Tactile cues for weight shifting;Visual cues for safe use of DME/AE;Verbal cues for technique      Ambulation/Gait   Ambulation/Gait  Yes    Ambulation/Gait Assistance  3: Mod assist   2 people assist for safety but 2nd person contact assist   Ambulation/Gait Assistance Details  verbal & demo cues on longer step length & looking forward to facilitate upright posture     Ambulation Distance (Feet)  28 Feet   28' & 18'   Assistive device  Rolling walker    Gait Pattern  Step-to pattern;Decreased step length - left;Decreased stride length;Right foot flat;Left foot flat;Right flexed knee in stance;Left flexed knee in stance;Trunk flexed;Poor foot clearance - left;Poor foot clearance - right    Ambulation Surface  Indoor;Level      Shoulder Exercises: Supine   Other Supine Exercises  PTs, pt & wife discussed high tone issues (LE flexion) in morning. Consider using wedge between LEs at night and performing bridge exercise to weight bear thru LEs             PT Education - 05/08/18 1115    Education Details  using ergometer on table at chest height for UEs    Person(s) Educated  Patient;Spouse    Methods  Explanation;Demonstration;Verbal cues    Comprehension  Verbalized understanding;Returned demonstration       PT Short Term Goals - 05/08/18 1200      PT SHORT TERM GOAL #1   Title  Patient & wife demonstrate & verbalize understanding of updated HEP. (All updated STGs Target Date: 04/15/2018)    Baseline  04/15/18 Met with current HEP but not with updated exercises from session last week.    Status  Partially Met      PT SHORT TERM GOAL #2   Title  Patient performs scooting transfers right & left between level surfaces with supervision.    Baseline  03/11/2018 MET    Status  Achieved      PT SHORT TERM GOAL #3   Title  Patient sit to/from supine with patient minimal assist with LE movements.     Baseline  MET 04/10/2018    Status  Achieved      PT SHORT TERM GOAL #4   Title  Sit to/from stand w/c to sink 3 reps with minimal guard.     Baseline  Met 04/15/18.    Status  Achieved      PT SHORT TERM GOAL #5   Title  Patient ambulates 89' with RW with moderate assist.     Baseline  04/15/18 Pt ambulates 17' with RW Mod. A    Status  Partially Met        PT Long Term Goals - 05/08/18 1200      PT LONG TERM GOAL #1   Title  Patient &  wife demonstrate & verbalize understanding of  ongoing exercise / fitness plan including community programs. (All LTGs 05/13/18)    Baseline  MET to date 05/08/2018 but PT continues to update    Time  3    Period  Months    Status  Achieved      PT LONG TERM GOAL #2   Title  w/c to bed (mat at similar ht to his bed) scooting or squat pivot transfer modified independent.    Time  3    Period  Months    Status  On-going      PT LONG TERM GOAL #3   Title  Sit to/from supine modified independent.    Time  3    Period  Months    Status  On-going      PT LONG TERM GOAL #4   Title  Sit to/from stand chairs with armrests to RW with supervision.    Baseline  NOT MET but improved 05/08/2018 Initial sit to stand ModA progresses to MinA    Time  3    Period  Months    Status  Not Met      PT LONG TERM GOAL #5   Title  Patient standing balance with RW with BUE support staticly 2 minutes, scans environment and reaches within length of arm for objects with supervision.     Baseline  NOT MET 05/08/2018 but improved standing balance with RW statically up to 1 min, scans with cervical motions only. Unable to release RW to reach    Time  3    Period  Months    Status  Not Met      PT LONG TERM GOAL #6   Title  Patient ambulates 71' with RW with minA.     Baseline  NOT MET 05/08/2018 but progressing Pt ambulates up to 30' with RW with modA (2nd person for safety as LEs fatigue quickly)    Time  3    Period  Months    Status  Not Met            Plan - 05/08/18 1200    Clinical Impression Statement  Patient has progressed with function and has potential to make further improvements with additional skilled PT care. Patient seems to be performing exercises & activities at home that PT has instructed but is limited to things that his elderly wife can assist.     Rehab Potential  Good    Clinical Impairments Affecting Rehab Potential  weakness & ROM impaired 4 extremities & trunk; dependency  transfers, bed mobility, standing & gait    PT Frequency  2x / week    PT Duration  Other (comment)   13 weeks, 90 days   PT Treatment/Interventions  ADLs/Self Care Home Management;Canalith Repostioning;Moist Heat;DME Instruction;Gait training;Stair training;Functional mobility training;Therapeutic activities;Therapeutic exercise;Balance training;Neuromuscular re-education;Patient/family education;Manual techniques;Passive range of motion;Vestibular    PT Next Visit Plan  check remaining 2 LTGs and recertify    PT Home Exercise Plan  Access Code: [email or web address]     Consulted and Agree with Plan of Care  Patient;Family member/caregiver    Family Member Consulted  wife       Patient will benefit from skilled therapeutic intervention in order to improve the following deficits and impairments:  Abnormal gait, Cardiopulmonary status limiting activity, Decreased activity tolerance, Decreased balance, Decreased endurance, Decreased knowledge of use of DME, Decreased mobility, Decreased range of motion, Decreased strength, Difficulty walking, Dizziness, Impaired flexibility, Impaired UE functional use, Postural dysfunction  Visit  Diagnosis: Unsteadiness on feet  Abnormal posture  Muscle weakness (generalized)  Other symptoms and signs involving the nervous system  Other symptoms and signs involving the musculoskeletal system  Other abnormalities of gait and mobility     Problem List Patient Active Problem List   Diagnosis Date Noted  . 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 Acute respiratory failure with hypoxemia ([place]) 08/18/2017  . Hypokalemia 08/18/2017  . Leukocytosis 08/18/2017  . Muscle atrophy of lower extremity 08/09/2017  . Bowel and bladder incontinence 08/09/2017  . Right-sided muscle weakness 08/09/2017  . Neuropathy 08/09/2017  . 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/2017  . Abnormality of gait 07/12/2017  . Myoclonic jerkings, 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/2017  . Acquired  right foot drop 07/12/2017  . UTI (urinary tract infection) due to Enterococcus 07/12/2017  . Pressure injury of skin 06/21/2017  . Acute cystitis 06/20/2017  . Generalized weakness 06/20/2017  . Dehydration 06/20/2017  . Dyspnea 12/26/2016  . Chronic cough 12/26/2016  . Hypersomnia with sleep apnea 10/30/2016  . CSA (central sleep apnea) 10/30/2016  . Wheezing symptom 10/30/2016  . CPAP/BiPAP dependent 07/01/2015  . 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 RLS (restless legs syndrome) 08/25/2014  . Primary gout 08/25/2014  . OSA on CPAP 08/25/2014  . Severe obesity (BMI >= 40) ([place]) 08/25/2014  . Obesity (BMI 30-39.9) 02/18/2013  . Sleep apnea with use of continuous positive airway pressure (CPAP)   . 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 S/P TKR (total knee replacement) 11/29/2012  . History of thyroid cancer     Auguste Tebbetts PT, DPT 05/09/2018, 5:51 AM  [facility] [address] [place], [place], [address] Phone: [phone]   Fax:  [phone]  Name: GARALD RHEW MRN: [id number] Date of Birth: 09/11/1940

## 2018-05-13 ENCOUNTER — Encounter: Payer: Self-pay | Admitting: Occupational Therapy

## 2018-05-13 ENCOUNTER — Ambulatory Visit: Payer: Medicare Other | Admitting: Physical Therapy

## 2018-05-13 ENCOUNTER — Ambulatory Visit: Payer: Medicare Other | Admitting: Occupational Therapy

## 2018-05-13 DIAGNOSIS — R278 Other lack of coordination: Secondary | ICD-10-CM

## 2018-05-13 DIAGNOSIS — M6281 Muscle weakness (generalized): Secondary | ICD-10-CM

## 2018-05-13 DIAGNOSIS — M6249 Contracture of muscle, multiple sites: Secondary | ICD-10-CM

## 2018-05-13 DIAGNOSIS — M62838 Other muscle spasm: Secondary | ICD-10-CM

## 2018-05-13 DIAGNOSIS — R2681 Unsteadiness on feet: Secondary | ICD-10-CM

## 2018-05-13 DIAGNOSIS — R293 Abnormal posture: Secondary | ICD-10-CM

## 2018-05-13 DIAGNOSIS — Z9181 History of falling: Secondary | ICD-10-CM

## 2018-05-13 DIAGNOSIS — M545 Low back pain, unspecified: Secondary | ICD-10-CM

## 2018-05-13 DIAGNOSIS — R29898 Other symptoms and signs involving the musculoskeletal system: Secondary | ICD-10-CM

## 2018-05-13 DIAGNOSIS — R2689 Other abnormalities of gait and mobility: Secondary | ICD-10-CM

## 2018-05-13 DIAGNOSIS — R29818 Other symptoms and signs involving the nervous system: Secondary | ICD-10-CM

## 2018-05-13 DIAGNOSIS — G8929 Other chronic pain: Secondary | ICD-10-CM

## 2018-05-13 DIAGNOSIS — M25612 Stiffness of left shoulder, not elsewhere classified: Secondary | ICD-10-CM

## 2018-05-13 NOTE — Therapy (Signed)
[facility] [address] [place], [place], [address] Phone: [phone]   Fax:  [phone]  Occupational Therapy Treatment  Patient Details  Name: [name] MRN: [id number] Date of Birth: 08/28/1940 No data recorded  Encounter Date: 05/13/2018  OT End of Session - 05/13/18 1548    Visit Number  11    Number of Visits  16    Date for OT Re-Evaluation  05/21/18    Authorization Type  blue medicare will need PN every 10th visit    Authorization Time Period  90    Authorization - Visit Number  11    Authorization - Number of Visits  20    OT Start Time  1447    OT Stop Time  1531    OT Time Calculation (min)  44 min    Activity Tolerance  Patient tolerated treatment well    Behavior During Therapy  WFL for tasks assessed/performed       Past Medical History:  Diagnosis Date  . Arthritis   . Barrett esophagus   . Bronchitis   . CAD (coronary artery disease)    pt's wife denies any cardiac history on pt.  . Cancer ([facility])    thyroid  . Carotid artery occlusion   . Chronic kidney disease    denies  . Constipation   . COPD (chronic obstructive pulmonary disease) ([facility])   . Diabetes mellitus without complication ([facility])   . Dyspnea   . GERD (gastroesophageal reflux disease)   . Headache    migraines  . History of thyroid cancer 2008  . History of thyroid cancer   . Hypertension   . OSA (obstructive sleep apnea)   . Pneumonia   . Restless leg   . S/P tendon repair    left thumb  . Sleep apnea with use of continuous positive airway pressure (CPAP)    [facility] , AHI  77cn central and obstrcutive. 16 cm water , 3 cm EPR.   . Thoracic ascending aortic aneurysm (HCC)    4.2 cm ascending TAA 08/18/17 CTA. Annual imaging recommended.  . Thyroid disease   . Ulcer    Peptic ulcer disease    Past Surgical History:  Procedure Laterality Date  . ANTERIOR CERVICAL DECOMP/DISCECTOMY FUSION N/A 09/03/2017    Procedure: ACDF - C3-C4 - C4-C5 - C5-C6;  Surgeon: Cram, Gary, MD;  Location: [facility];  Service: Neurosurgery;  Laterality: N/A;  . CAROTID ENDARTERECTOMY Left January 03, 2006   Dr. Hayes  . COLONOSCOPY    . EYE SURGERY Bilateral    cataract surgery with lens implants  . game keepers thumb Right   . JOINT REPLACEMENT Left    knee X 2  . ROTATOR CUFF REPAIR Right   . THYROIDECTOMY  2008    There were no vitals filed for this visit.  Subjective Assessment - 05/13/18 1517    Subjective   I am not having any pain right now    Patient is accompained by:  Family member    Pertinent History  ACDF C3- C6 09/03/2017. Pt began with LE weakness 05/2017.   Pt has been wheelchair bound starting in 06/2017. Pt in SNF for a month, then went home.  Pt then had ACDF C3- C6 09/03/2017 pt then d/c to SNF until 11/29/2017 for PT, OT and ST.  Pt discharged home with HHPT and OT which ended right before pt started with PT here in this   clinic.  PMH:    Patient Stated Goals  get my arms working better and move better    Currently in Pain?  No/denies    Pain Score  0-No pain                   OT Treatments/Exercises (OP) - 05/13/18 0001      ADLs   LB Dressing  Had patient practice donning and doffing shoes and shorts in clinic on firm suface.  First worked on movement components, flexing toward feet, and/or bringing feet up to cross.  Patient able to utilize either method.  Patient initally fearful that he would not be able to safely reach to floor, but on multiple occasions today was able to do so.  Patient able to put pants over feet, pull up to hips, then lay back to pull over hips.  Discussed technique for daily toileting clothing management - patient able to stand with walker, and using one hand at a time, pull pants up or down.      ADL Comments  Reviewed remaining long term goals.        Neurological Re-education Exercises   Other Exercises 1  Neuromuscular reeducation to address scapular  depression with forward reach in supine and in sitting.  Patient is building strength in supported and still needs cueing for mechanics of low reach in upright position.                OT Education - 05/13/18 1548    Education Details  LB Dressing from seated or standing    Person(s) Educated  Patient;Spouse    Methods  Explanation;Demonstration;Tactile cues    Comprehension  Returned demonstration;Need further instruction       OT Short Term Goals - 05/01/18 1302      OT SHORT TERM GOAL #1   Title  Pt and wife will be mod I with UE home activities program focused on UE strenthening, coordination and UE functional use (BUE's) - 04/23/2018    Status  Achieved      OT SHORT TERM GOAL #2   Title  Pt will demonstrate ability for active L shoulder flexion in supine to 125* without pain in prep for overhead functional reach.     Status  Achieved      OT SHORT TERM GOAL #3   Title  Pt will demonstrate ability to pick up light weight object with LUE at mid reach (75* of shoulder flexion).      Status  Achieved      OT SHORT TERM GOAL #4   Title  Pt will demonstrate ability to use reacher to don pants over feet in sitting with min a.    Status  Achieved      OT SHORT TERM GOAL #5   Title  Patient will demonstrate improved coordination as evidenced by reduced time on 9 hole peg test by 10 seconds each hand - to improve ability to manipulate small objects - pills, buttons, etc.      Baseline  right 1 min 19 sec (10/16)   left 58 sec (10/16)    Status  Achieved      OT SHORT TERM GOAL #6   Title  Pt will demonstate improved grip strength by at least 5 pounds total to assist with ADL tasks. (baseline L= 45, R =38)    Status  Not Met        OT Long Term Goals - 04/29/18 1247  OT LONG TERM GOAL #1   Title  Pt and wife will be mod I with upgraded home activities program - 05/21/2018    Status  On-going      OT LONG TERM GOAL #2   Title  Pt will demonstrate ability to comb  hair mod I    Status  Achieved      OT LONG TERM GOAL #3   Title  Pt will be min a for simple hot familiar meal prep    Status  On-going      OT LONG TERM GOAL #4   Title  Pt will be mod I using reacher to don pants over feet.     Status  Achieved      OT LONG TERM GOAL #5   Title  Pt will demonstrate improved grip strength by at least 7 pounds total to assist with simple cooking tasks (baseline L= 45, R = 38).    Status  On-going      OT LONG TERM GOAL #6   Title  Pt will verbalize understanding of adapted techniques for buttoning and zipping prn    Status  Achieved      OT LONG TERM GOAL #7   Title  Pt will be able to lift light weight object at mid reach during simple cooking tasks with LUE    Status  On-going      OT LONG TERM GOAL #8   Title  Pt will be able to lift 2 pound object bilaterally from mid reach to assist with functional activities at home.     Status  On-going            Plan - 05/13/18 1549    Clinical Impression Statement  Patient is showing some progress with sit to stand transition.  Needs further carryover of ADL participation in home setting.      Occupational Profile and client history currently impacting functional performance  husband, friend, reitiree.  PMH:  CAD, DM, COPD, hypoxic respiratory failure, HTN    Occupational performance deficits (Please refer to evaluation for details):  ADL's;IADL's;Leisure;Social Participation;Rest and Sleep    Rehab Potential  Fair    Current Impairments/barriers affecting progress:  length of time since onset, severity of deficits    OT Frequency  2x / week    OT Duration  8 weeks    OT Treatment/Interventions  Self-care/ADL training;Moist Heat;Therapeutic exercise;Neuromuscular education;DME and/or AE instruction;Passive range of motion;Manual Therapy;Functional Mobility Training;Therapeutic activities;Patient/family education;Balance training    Plan  NMR sit to stand supine to sit, standing weight shift,  decreased relaince on UE's, mid functional reach.      Clinical Decision Making  Multiple treatment options, significant modification of task necessary    Consulted and Agree with Plan of Care  Patient;Family member/caregiver    Family Member Consulted  wife       Patient will benefit from skilled therapeutic intervention in order to improve the following deficits and impairments:  Decreased activity tolerance, Decreased balance, Decreased coordination, Decreased range of motion, Decreased mobility, Decreased knowledge of use of DME, Decreased strength, Difficulty walking, Impaired UE functional use, Impaired sensation, Impaired flexibility, Pain  Visit Diagnosis: Unsteadiness on feet - Plan: Ot plan of care cert/re-cert  Abnormal posture - Plan: Ot plan of care cert/re-cert  Muscle weakness (generalized) - Plan: Ot plan of care cert/re-cert  Other symptoms and signs involving the nervous system - Plan: Ot plan of care cert/re-cert  Other symptoms and signs involving   the musculoskeletal system - Plan: Ot plan of care cert/re-cert  Stiffness of left shoulder, not elsewhere classified - Plan: Ot plan of care cert/re-cert  Other lack of coordination - Plan: Ot plan of care cert/re-cert    Problem List Patient Active Problem List   Diagnosis Date Noted  . 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 Acute respiratory failure with hypoxemia ([place]) 08/18/2017  . Hypokalemia 08/18/2017  . Leukocytosis 08/18/2017  . Muscle atrophy of lower extremity 08/09/2017  . Bowel and bladder incontinence 08/09/2017  . Right-sided muscle weakness 08/09/2017  . Neuropathy 08/09/2017  . 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/2017  . Abnormality of gait 07/12/2017  . Myoclonic jerkings, 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/2017  . Acquired right foot drop 07/12/2017  . UTI (urinary tract infection) due to Enterococcus 07/12/2017  . Pressure injury of skin 06/21/2017  . Acute cystitis 06/20/2017  . Generalized  weakness 06/20/2017  . Dehydration 06/20/2017  . Dyspnea 12/26/2016  . Chronic cough 12/26/2016  . Hypersomnia with sleep apnea 10/30/2016  . CSA (central sleep apnea) 10/30/2016  . Wheezing symptom 10/30/2016  . CPAP/BiPAP dependent 07/01/2015  . 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 RLS (restless legs syndrome) 08/25/2014  . Primary gout 08/25/2014  . OSA on CPAP 08/25/2014  . Severe obesity (BMI >= 40) ([place]) 08/25/2014  . Obesity (BMI 30-39.9) 02/18/2013  . Sleep apnea with use of continuous positive airway pressure (CPAP)   . 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 S/P TKR (total knee replacement) 11/29/2012  . History of thyroid cancer     Mariah Milling, OTR/L 05/13/2018, 3:57 PM  [facility] [address] [place], [place], [address] Phone: [phone]   Fax:  [phone]  Name: [name] MRN: [id number] Date of Birth: [birth year]

## 2018-05-14 ENCOUNTER — Encounter: Payer: Self-pay | Admitting: Physical Therapy

## 2018-05-14 NOTE — Therapy (Signed)
[facility] [address] [place], [place], [address] Phone: [phone]   Fax:  [phone]  Physical Therapy Treatment  Patient Details  Name: [name] MRN: [id number] Date of Birth: 09-10-1940 Referring Provider (PT): Larey Seat, MD   Encounter Date: 05/13/2018  PT End of Session - 05/13/18 1800    Visit Number  24    Number of Visits  48    Date for PT Re-Evaluation  08/09/18    Authorization Type  [employer] Pt has met $5900 oop, full coverage, 10th visit PN    PT Start Time  1400    PT Stop Time  1445    PT Time Calculation (min)  45 min    Equipment Utilized During Treatment  Gait belt    Activity Tolerance  Patient tolerated treatment well    Behavior During Therapy  [facility] for tasks assessed/performed       Past Medical History:  Diagnosis Date  . Arthritis   . Barrett esophagus   . Bronchitis   . CAD (coronary artery disease)    pt's wife denies any cardiac history on pt.  . Cancer ([place])    thyroid  . Carotid artery occlusion   . Chronic kidney disease    denies  . Constipation   . COPD (chronic obstructive pulmonary disease) ([place])   . Diabetes mellitus without complication ([place])   . Dyspnea   . GERD (gastroesophageal reflux disease)   . Headache    migraines  . History of thyroid cancer 2008  . History of thyroid cancer   . Hypertension   . OSA (obstructive sleep apnea)   . Pneumonia   . Restless leg   . S/P tendon repair    left thumb  . Sleep apnea with use of continuous positive airway pressure (CPAP)    2011 [facility] , AHI  77cn central and obstrcutive. 16 cm water , 3 cm EPR.   . Thoracic ascending aortic aneurysm (HCC)    4.2 cm ascending TAA 08/18/17 CTA. Annual imaging recommended.  . Thyroid disease   . Ulcer    Peptic ulcer disease    Past Surgical History:  Procedure Laterality Date  . ANTERIOR CERVICAL DECOMP/DISCECTOMY FUSION N/A 09/03/2017   Procedure:  ACDF - C3-C4 - C4-C5 - C5-C6;  Surgeon: Kary Kos, MD;  Location: [facility];  Service: Neurosurgery;  Laterality: N/A;  . CAROTID ENDARTERECTOMY Left January 03, 2006   Dr. Amedeo Plenty  . COLONOSCOPY    . EYE SURGERY Bilateral    cataract surgery with lens implants  . game keepers thumb Right   . JOINT REPLACEMENT Left    knee X 2  . ROTATOR CUFF REPAIR Right   . THYROIDECTOMY  2008    There were no vitals filed for this visit.  Subjective Assessment - 05/13/18 1400    Subjective  wife & patient report improved function at home and want to continue PT to make additional gains.     Patient is accompained by:  Family member    Pertinent History  CAD, progressive muscular dystrophy, DM2, COPD, hypoxic respiratory failure, HTN,    Patient Stated Goals  To walk in home & community, wife wants him to be able to care for himself if something happens to her    Currently in Pain?  No/denies  [facility] Adult PT Treatment/Exercise - 05/13/18 1400      Bed Mobility   Rolling Left  Independent with assistive device   uses simulated bed rail like home   Supine to Sit  Independent with assistive device   uses simulated bed rail like home & increased time   Sit to Supine  Independent with assistive device   uses simulated bed rail like home & increased time     Transfers   Transfers  Sit to Stand;Stand to Sit;Squat Pivot Transfers    Sit to Stand  3: Mod assist;4: Min assist;With upper extremity assist;With armrests;From chair/3-in-1   to RW   Sit to Stand Details  Tactile cues for weight shifting;Visual cues for safe use of DME/AE;Visual cues/gestures for sequencing;Verbal cues for technique;Verbal cues for safe use of DME/AE    Stand to Sit  4: Min assist;With upper extremity assist;With armrests;To chair/3-in-1   from [employer] Transfers  4: Min assist;With upper extremity assistance   able to raise buttocks but MinA to pivot   Lateral/Scoot Transfers  5:  Supervision;With armrests removed      Self-Care   Self-Care  Other Self-Care Comments    Other Self-Care Comments   PT instructed with demo on patient in positioning of LEs in supine to minimize hypertonic flexion & adduction. Pt & wife verbalized understanding      Lumbar Exercises: Supine   Pelvic Tilt  5 reps;5 seconds    Pelvic Tilt Limitations  tactile & verbal cues on technique    Clam  3 seconds;Other (comment)   3 reps per LE, single LE with ab set   Clam Limitations  tactile cues on ab set & controlled single LE motion    Other Supine Lumbar Exercises  active assist / passive knee to chest stretch 15 sec hold 1 rep/LE               PT Short Term Goals - 05/13/18 1800      PT SHORT TERM GOAL #1   Title  Patient & wife demonstrate & verbalize understanding of updated HEP. (All updated STGs Target Date: 06/11/2018)    Time  1    Period  Months    Status  On-going    Target Date  06/11/18      PT SHORT TERM GOAL #2   Title  Patient performs squat pivot transfers using simulated bed rail right & left between surfaces 2" difference with contact assist.    Time  1    Period  Months    Status  Revised    Target Date  06/11/18      PT SHORT TERM GOAL #3   Title  Patient & wife report understanding of bed positioning recommendations to minimize hypertonic LE issues.    Time  1    Period  Months    Status  New    Target Date  06/11/18      PT SHORT TERM GOAL #4   Title  Sit to/from stand w/c to RW with minA for 3 reps.     Time  1    Period  Months    Status  Revised    Target Date  06/11/18      PT SHORT TERM GOAL #5   Title  Patient ambulates 41' with RW with moderate assist.     Time  1    Status  On-going    Target Date  06/11/18  PT Long Term Goals - 05/13/18 1800      PT LONG TERM GOAL #1   Title  Patient & wife demonstrate & verbalize understanding of ongoing exercise / fitness plan including community programs. (All LTGs 05/13/18)     Baseline  MET to date 05/08/2018 but PT continues to update    Time  3    Period  Months    Status  Achieved      PT LONG TERM GOAL #2   Title  w/c to bed (mat at similar ht to his bed) scooting or squat pivot transfer modified independent.    Baseline  partially MET 05/13/2018  Pt is able to transfer w/c to mat at same ht scooting safely but his bed is appartently ~4" higher and he requires minA for squat-pivot to this ht.     Time  3    Period  Months    Status  Partially Met      PT LONG TERM GOAL #3   Title  Sit to/from supine modified independent.    Baseline  MET 05/13/2018    Time  3    Period  Months    Status  Achieved      PT LONG TERM GOAL #4   Title  Sit to/from stand chairs with armrests to RW with supervision.    Baseline  NOT MET but improved 05/08/2018 Initial sit to stand ModA progresses to MinA    Time  3    Period  Months    Status  Not Met      PT LONG TERM GOAL #5   Title  Patient standing balance with RW with BUE support staticly 2 minutes, scans environment and reaches within length of arm for objects with supervision.     Baseline  NOT MET 05/08/2018 but improved standing balance with RW statically up to 1 min, scans with cervical motions only. Unable to release RW to reach    Time  3    Period  Months    Status  Not Met      PT LONG TERM GOAL #6   Title  Patient ambulates 27' with RW with minA.     Baseline  NOT MET 05/08/2018 but progressing Pt ambulates up to 27' with RW with modA (2nd person for safety as LEs fatigue quickly)    Time  3    Period  Months    Status  Not Met        PT Long Term Goals - 05/14/18 0612      PT LONG TERM GOAL #1   Title  Patient & wife demonstrate & verbalize understanding of ongoing exercise / fitness plan including community programs. (All LTGs 08/09/2018)    Time  3    Period  Months    Status  On-going    Target Date  08/09/18      PT LONG TERM GOAL #2   Title  w/c to bed (mat at similar ht to his bed)  squat pivot transfer modified independent.    Time  3    Period  Months    Status  Revised    Target Date  06/11/18      PT LONG TERM GOAL #3   Title  Patient and wife verbalize understanding of recommendations to manage spasms / hypertonic motions.     Time  3    Period  Months    Status  New    Target Date  06/11/18      PT LONG TERM GOAL #4   Title  Sit to/from stand chairs with armrests to RW with supervision.    Time  3    Period  Months    Status  On-going    Target Date  06/11/18      PT LONG TERM GOAL #5   Title  Patient standing balance with RW with BUE support staticly 2 minutes, scans environment and reaches within length of arm for objects with supervision.     Time  3    Period  Months    Status  On-going    Target Date  06/11/18      PT LONG TERM GOAL #6   Title  Patient ambulates 67' with RW with minA.     Time  3    Period  Months    Status  On-going    Target Date  06/11/18            Plan - 05/13/18 1800    Clinical Impression Statement  Patient has improved function but did fully meet his LTGs. He is requiring less assist from his elderly wife. Patient would benefit from additional skilled care to further improve his mobility and strength.     Rehab Potential  Good    Clinical Impairments Affecting Rehab Potential  weakness & ROM impaired 4 extremities & trunk; dependency transfers, bed mobility, standing & gait    PT Frequency  2x / week    PT Duration  Other (comment)   13 weeks, 90 days   PT Treatment/Interventions  ADLs/Self Care Home Management;Canalith Repostioning;Moist Heat;DME Instruction;Gait training;Stair training;Functional mobility training;Therapeutic activities;Therapeutic exercise;Balance training;Neuromuscular re-education;Patient/family education;Manual techniques;Passive range of motion;Vestibular    PT Next Visit Plan  work towards updated STGs.    PT Home Exercise Plan  Access Code: [email or web address]     Consulted and Agree with Plan  of Care  Patient;Family member/caregiver    Family Member Consulted  wife       Patient will benefit from skilled therapeutic intervention in order to improve the following deficits and impairments:  Abnormal gait, Cardiopulmonary status limiting activity, Decreased activity tolerance, Decreased balance, Decreased endurance, Decreased knowledge of use of DME, Decreased mobility, Decreased range of motion, Decreased strength, Difficulty walking, Dizziness, Impaired flexibility, Impaired UE functional use, Postural dysfunction  Visit Diagnosis: Unsteadiness on feet  Abnormal posture  Muscle weakness (generalized)  Other symptoms and signs involving the nervous system  Other symptoms and signs involving the musculoskeletal system  Other abnormalities of gait and mobility  Chronic midline low back pain without sciatica  History of falling  Contracture of muscle, multiple sites  Other muscle spasm     Problem List Patient Active Problem List   Diagnosis Date Noted  . 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 Acute respiratory failure with hypoxemia ([place]) 08/18/2017  . Hypokalemia 08/18/2017  . Leukocytosis 08/18/2017  . Muscle atrophy of lower extremity 08/09/2017  . Bowel and bladder incontinence 08/09/2017  . Right-sided muscle weakness 08/09/2017  . Neuropathy 08/09/2017  . 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/2017  . Abnormality of gait 07/12/2017  . Myoclonic jerkings, 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/2017  . Acquired right foot drop 07/12/2017  . UTI (urinary tract infection) due to Enterococcus 07/12/2017  . Pressure injury of skin 06/21/2017  . Acute cystitis 06/20/2017  . Generalized weakness 06/20/2017  . Dehydration 06/20/2017  . Dyspnea 12/26/2016  . Chronic cough 12/26/2016  . Hypersomnia with sleep apnea 10/30/2016  .  CSA (central sleep apnea) 10/30/2016  . Wheezing symptom 10/30/2016  . CPAP/BiPAP dependent 07/01/2015  . Complex sleep apnea syndrome  07/01/2015  . RLS (restless legs syndrome) 08/25/2014  . Primary gout 08/25/2014  . OSA on CPAP 08/25/2014  . Severe obesity (BMI >= 40) ([place]) 08/25/2014  . Obesity (BMI 30-39.9) 02/18/2013  . Sleep apnea with use of continuous positive airway pressure (CPAP)   . 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 S/P TKR (total knee replacement) 11/29/2012  . History of thyroid cancer     Frederick Klinger PT, DPT 05/14/2018, 6:11 AM  [facility] [address] [place], [place], [address] Phone: [phone]   Fax:  [phone]  Name: JAVONTAE MARLETTE MRN: [id number] Date of Birth: 1940/10/03

## 2018-05-15 ENCOUNTER — Ambulatory Visit: Payer: Medicare Other | Admitting: Physical Therapy

## 2018-05-15 ENCOUNTER — Encounter: Payer: Self-pay | Admitting: Physical Therapy

## 2018-05-15 DIAGNOSIS — G8929 Other chronic pain: Secondary | ICD-10-CM

## 2018-05-15 DIAGNOSIS — M6281 Muscle weakness (generalized): Secondary | ICD-10-CM

## 2018-05-15 DIAGNOSIS — M545 Low back pain: Secondary | ICD-10-CM

## 2018-05-15 DIAGNOSIS — R293 Abnormal posture: Secondary | ICD-10-CM

## 2018-05-15 DIAGNOSIS — R2681 Unsteadiness on feet: Secondary | ICD-10-CM

## 2018-05-15 DIAGNOSIS — R2689 Other abnormalities of gait and mobility: Secondary | ICD-10-CM

## 2018-05-15 DIAGNOSIS — R29818 Other symptoms and signs involving the nervous system: Secondary | ICD-10-CM

## 2018-05-15 NOTE — Therapy (Signed)
[facility] [address] [place], [place], [address] Phone: [phone]   Fax:  [phone]  Physical Therapy Treatment  Patient Details  Name: [name] MRN: [id number] Date of Birth: 09/27/1940 Referring Provider (PT): Larey Seat, MD   Encounter Date: 05/15/2018  PT End of Session - 05/15/18 1624    Visit Number  25    Number of Visits  48    Date for PT Re-Evaluation  08/09/18    Authorization Type  [employer] Pt has met $5900 oop, full coverage, 10th visit PN    PT Start Time  1407    PT Stop Time  1445    PT Time Calculation (min)  38 min    Activity Tolerance  Other (comment)   limited by flexor tone   Behavior During Therapy  [facility] for tasks assessed/performed       Past Medical History:  Diagnosis Date  . Arthritis   . Barrett esophagus   . Bronchitis   . CAD (coronary artery disease)    pt's wife denies any cardiac history on pt.  . Cancer ([place])    thyroid  . Carotid artery occlusion   . Chronic kidney disease    denies  . Constipation   . COPD (chronic obstructive pulmonary disease) ([place])   . Diabetes mellitus without complication ([place])   . Dyspnea   . GERD (gastroesophageal reflux disease)   . Headache    migraines  . History of thyroid cancer 2008  . History of thyroid cancer   . Hypertension   . OSA (obstructive sleep apnea)   . Pneumonia   . Restless leg   . S/P tendon repair    left thumb  . Sleep apnea with use of continuous positive airway pressure (CPAP)    [facility] , AHI  77cn central and obstrcutive. 16 cm water , 3 cm EPR.   . Thoracic ascending aortic aneurysm (HCC)    4.2 cm ascending TAA 08/18/17 CTA. Annual imaging recommended.  . Thyroid disease   . Ulcer    Peptic ulcer disease    Past Surgical History:  Procedure Laterality Date  . ANTERIOR CERVICAL DECOMP/DISCECTOMY FUSION N/A 09/03/2017   Procedure: ACDF - C3-C4 - C4-C5 - C5-C6;  Surgeon:  Kary Kos, MD;  Location: [facility];  Service: Neurosurgery;  Laterality: N/A;  . CAROTID ENDARTERECTOMY Left January 03, 2006   Dr. Amedeo Plenty  . COLONOSCOPY    . EYE SURGERY Bilateral    cataract surgery with lens implants  . game keepers thumb Right   . JOINT REPLACEMENT Left    knee X 2  . ROTATOR CUFF REPAIR Right   . THYROIDECTOMY  2008    There were no vitals filed for this visit.  Subjective Assessment - 05/15/18 1408    Subjective  Still has bladder infection and is back on medicine.  RLE flexion spasms are significantly increased today.    Patient is accompained by:  Family member    Pertinent History  CAD, progressive muscular dystrophy, DM2, COPD, hypoxic respiratory failure, HTN,    Patient Stated Goals  To walk in home & community, wife wants him to be able to care for himself if something happens to her    Currently in Pain?  Yes    Pain Location  Shoulder    Pain Orientation  Left    Pain Descriptors / Indicators  Sore    Pain Type  Chronic  pain                       [facility] Adult PT Treatment/Exercise - 05/15/18 1509      Transfers   Transfers  Sit to Stand;Stand to Sit;Squat Pivot Transfers    Sit to Stand  4: Min assist    Sit to Stand Details (indicate cue type and reason)  improved timing of hip and knee extension after leg press    Stand to Sit  4: Min assist    Squat Pivot Transfers  1: +2 Total assist    Squat Pivot Transfer Details (indicate cue type and reason)  from w/c > leg press machine over two handles      Ambulation/Gait   Gait Comments  attempted gait with RW twice after performing leg press.  Pt only able to take 1-2 steps before needing to sit due to RLE flexion spasm      Exercises   Exercises  Knee/Hip      Knee/Hip Exercises: Machines for Strengthening   Total Gym Leg Press  Bilat LE x 10 reps at 80 lb with therapist assisting with maintaining R foot placement on plate and cues for controlled flexion back to starting position.   Also performed 6 reps each single leg press 40lb on LLE, 35lb RLE             PT Education - 05/15/18 1629    Education Details  importance of hydration with water when he has a UTI    Person(s) Educated  Patient    Methods  Explanation    Comprehension  Verbalized understanding       PT Short Term Goals - 05/13/18 1800      PT SHORT TERM GOAL #1   Title  Patient & wife demonstrate & verbalize understanding of updated HEP. (All updated STGs Target Date: 06/11/2018)    Time  1    Period  Months    Status  On-going    Target Date  06/11/18      PT SHORT TERM GOAL #2   Title  Patient performs squat pivot transfers using simulated bed rail right & left between surfaces 2" difference with contact assist.    Time  1    Period  Months    Status  Revised    Target Date  06/11/18      PT SHORT TERM GOAL #3   Title  Patient & wife report understanding of bed positioning recommendations to minimize hypertonic LE issues.    Time  1    Period  Months    Status  New    Target Date  06/11/18      PT SHORT TERM GOAL #4   Title  Sit to/from stand w/c to RW with minA for 3 reps.     Time  1    Period  Months    Status  Revised    Target Date  06/11/18      PT SHORT TERM GOAL #5   Title  Patient ambulates 47' with RW with moderate assist.     Time  1    Status  On-going    Target Date  06/11/18        PT Long Term Goals - 05/15/18 1625      PT LONG TERM GOAL #1   Title  Patient & wife demonstrate & verbalize understanding of ongoing exercise / fitness plan including community programs. (All  LTGs 08/09/2018)    Time  3    Period  Months    Status  On-going    Target Date  08/09/17      PT LONG TERM GOAL #2   Title  w/c to bed (mat at similar ht to his bed) squat pivot transfer modified independent.    Time  3    Period  Months    Status  Revised    Target Date  08/09/17      PT LONG TERM GOAL #3   Title  Patient and wife verbalize understanding of recommendations  to manage spasms / hypertonic motions.     Time  3    Period  Months    Status  New    Target Date  08/09/17      PT LONG TERM GOAL #4   Title  Sit to/from stand chairs with armrests to RW with supervision.    Time  3    Period  Months    Status  On-going    Target Date  08/09/17      PT LONG TERM GOAL #5   Title  Patient standing balance with RW with BUE support staticly 2 minutes, scans environment and reaches within length of arm for objects with supervision.     Time  3    Period  Months    Status  On-going    Target Date  08/09/17      PT LONG TERM GOAL #6   Title  Patient ambulates 44' with RW with minA.     Time  3    Period  Months    Status  On-going    Target Date  08/09/17            Plan - 05/15/18 1630    Clinical Impression Statement  Performed bilat and single leg extension strengthening on leg press machine in gravity minimized position to decrease use of UE during LE extension activation.  Initially pt experienced increased flexor tone on RLE during leg press and required therapist's assistance to maintain foot placement on plate.   With repetition pt able to sustain extension activation without assistance.  Pt demonstrated improved sit to stand but then was unable to ambulate due to flexor tone.  Increased tone may likely be due to reoccurence of UTI.  Will continue to monitor and progress as pt is able to tolerate.    Rehab Potential  Good    Clinical Impairments Affecting Rehab Potential  weakness & ROM impaired 4 extremities & trunk; dependency transfers, bed mobility, standing & gait    PT Frequency  2x / week    PT Duration  Other (comment)   13 weeks, 90 days   PT Treatment/Interventions  ADLs/Self Care Home Management;Canalith Repostioning;Moist Heat;DME Instruction;Gait training;Stair training;Functional mobility training;Therapeutic activities;Therapeutic exercise;Balance training;Neuromuscular re-education;Patient/family education;Manual  techniques;Passive range of motion;Vestibular    PT Next Visit Plan  work towards updated STGs.  Did well on leg press machine    PT Home Exercise Plan  Access Code: [email or web address]     Consulted and Agree with Plan of Care  Patient;Family member/caregiver    Family Member Consulted  wife       Patient will benefit from skilled therapeutic intervention in order to improve the following deficits and impairments:  Abnormal gait, Cardiopulmonary status limiting activity, Decreased activity tolerance, Decreased balance, Decreased endurance, Decreased knowledge of use of DME, Decreased mobility, Decreased range of motion, Decreased strength, Difficulty walking, Dizziness,  Impaired flexibility, Impaired UE functional use, Postural dysfunction  Visit Diagnosis: Abnormal posture  Unsteadiness on feet  Other abnormalities of gait and mobility  Chronic midline low back pain without sciatica  Muscle weakness (generalized)  Other symptoms and signs involving the nervous system     Problem List Patient Active Problem List   Diagnosis Date Noted  . 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 Acute respiratory failure with hypoxemia ([place]) 08/18/2017  . Hypokalemia 08/18/2017  . Leukocytosis 08/18/2017  . Muscle atrophy of lower extremity 08/09/2017  . Bowel and bladder incontinence 08/09/2017  . Right-sided muscle weakness 08/09/2017  . Neuropathy 08/09/2017  . 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/2017  . Abnormality of gait 07/12/2017  . Myoclonic jerkings, 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/2017  . Acquired right foot drop 07/12/2017  . UTI (urinary tract infection) due to Enterococcus 07/12/2017  . Pressure injury of skin 06/21/2017  . Acute cystitis 06/20/2017  . Generalized weakness 06/20/2017  . Dehydration 06/20/2017  . Dyspnea 12/26/2016  . Chronic cough 12/26/2016  . Hypersomnia with sleep apnea 10/30/2016  . CSA (central sleep apnea) 10/30/2016  . Wheezing symptom 10/30/2016  . CPAP/BiPAP  dependent 07/01/2015  . 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 RLS (restless legs syndrome) 08/25/2014  . Primary gout 08/25/2014  . OSA on CPAP 08/25/2014  . Severe obesity (BMI >= 40) ([place]) 08/25/2014  . Obesity (BMI 30-39.9) 02/18/2013  . Sleep apnea with use of continuous positive airway pressure (CPAP)   . 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 S/P TKR (total knee replacement) 11/29/2012  . History of thyroid cancer     Rico Junker, PT, DPT 05/15/18    4:38 PM    [facility] [address] [place], [place], [address] Phone: [phone]   Fax:  [phone]  Name: [name] MRN: [id number] Date of Birth: [birth year]

## 2018-05-20 ENCOUNTER — Ambulatory Visit: Payer: Medicare Other | Admitting: Neurology

## 2018-05-20 ENCOUNTER — Ambulatory Visit: Payer: Medicare Other | Attending: Neurology | Admitting: Physical Therapy

## 2018-05-20 ENCOUNTER — Encounter: Payer: Self-pay | Admitting: Neurology

## 2018-05-20 ENCOUNTER — Encounter: Payer: Self-pay | Admitting: Physical Therapy

## 2018-05-20 VITALS — BP 140/82 | HR 82 | Ht 67.0 in | Wt 203.0 lb

## 2018-05-20 DIAGNOSIS — G253 Myoclonus: Secondary | ICD-10-CM | POA: Diagnosis not present

## 2018-05-20 DIAGNOSIS — R2681 Unsteadiness on feet: Secondary | ICD-10-CM

## 2018-05-20 DIAGNOSIS — M545 Low back pain, unspecified: Secondary | ICD-10-CM

## 2018-05-20 DIAGNOSIS — M25612 Stiffness of left shoulder, not elsewhere classified: Secondary | ICD-10-CM | POA: Diagnosis present

## 2018-05-20 DIAGNOSIS — R293 Abnormal posture: Secondary | ICD-10-CM | POA: Diagnosis present

## 2018-05-20 DIAGNOSIS — G992 Myelopathy in diseases classified elsewhere: Secondary | ICD-10-CM

## 2018-05-20 DIAGNOSIS — M6281 Muscle weakness (generalized): Secondary | ICD-10-CM | POA: Insufficient documentation

## 2018-05-20 DIAGNOSIS — R29818 Other symptoms and signs involving the nervous system: Secondary | ICD-10-CM

## 2018-05-20 DIAGNOSIS — Z9181 History of falling: Secondary | ICD-10-CM | POA: Diagnosis present

## 2018-05-20 DIAGNOSIS — M6249 Contracture of muscle, multiple sites: Secondary | ICD-10-CM | POA: Diagnosis present

## 2018-05-20 DIAGNOSIS — R29898 Other symptoms and signs involving the musculoskeletal system: Secondary | ICD-10-CM | POA: Insufficient documentation

## 2018-05-20 DIAGNOSIS — G8929 Other chronic pain: Secondary | ICD-10-CM | POA: Insufficient documentation

## 2018-05-20 DIAGNOSIS — N39492 Postural (urinary) incontinence: Secondary | ICD-10-CM

## 2018-05-20 DIAGNOSIS — R2689 Other abnormalities of gait and mobility: Secondary | ICD-10-CM | POA: Diagnosis present

## 2018-05-20 DIAGNOSIS — M4802 Spinal stenosis, cervical region: Secondary | ICD-10-CM | POA: Diagnosis not present

## 2018-05-20 MED ORDER — BACLOFEN 10 MG PO TABS: 10.0000 mg | ORAL_TABLET | Freq: Three times a day (TID) | ORAL | 5 refills | Status: DC

## 2018-05-20 NOTE — Progress Notes (Signed)
[facility]   Provider:  Larey Seat, MD   Referring Provider: [name], MD   Primary Care Physician:  [name], MD  Chief Complaint  Patient presents with  . Follow-up    pt with wife, rm 46. pt states that he had surgery on 3/18 and was in nsg home until june. still working with neuro rehab therapy next dorr since being dc to home.   . Other    pt uses machine every night, [facility]. when they went to bring the card in the card was not inserted all the way last download was from august. pt states he has used it every night.    HPI: I had the pleasure of meeting today with Cesar Course, MD 77 year old gentleman seen on 20 May 2018 after a 90-month hiatus. In my last visit the patient has reported difficulties with controlling his bladder and bowel, he had become very weak both lower extremities were affected but also he was leaning towards his right side and felt that there was weakness of the right upper extremity and torso.  I obtained first an MRI of the brain with and without contrast which did not show an onset of, there was moderate to moderate generalized cortical atrophy and 1 minimal chronic microvascular changes overall healthy for age.  Dr. Mariea Stable then followed with a cervical spine MRI which showed severe spinal stenosis with an anterior posterior diameter of 5 mm, causing spinal cord compression and myelopathic signal changes within the spinal cord.  There were severe bilateral foraminal narrowing noticed especially at C4 there was more moderate stenosis between C4-5, again severe stenosis at C5 and C6, and again C6 and C7 had only mild spinal stenosis.  The patient was immediately referred for surgical decompression with Dr. Saintclair Halsted neurosurgery.  There were also multilevel degenerative changes at the lower spine but none of this was comparable to the cervical spine findings.  He is now 7 month post surgical intervention  and has had PT, rehab.  He is not in pain, has less myoclonic jerks.    I have followed [name] mainly for a sleep disorder and he has been 100% compliant CPAP user using an AutoSet between 12 and 20 cmH2O was 2 cm EPR, he has a lots of air leaks which causes erroneously high apnea problems.  He also struggles with bronchitis and a productive cough and has a nasal septal deviation that makes it harder to breathe through the nose.  He endorsed today the fatigue severity scale at 50, the Epworth Sleepiness Scale at 20 out of 24 points and the geriatric depression scale at 7 out of 15 points. He has a bruised nasal bridge which comes as a result from wearing the mask too tightly.       [name] is a 77 y.o. male is seen here today, 07-12-2017 , for intermittent leg jerking movements as well as irregular loss of muscle tone and control of the lower extremities.  His knees may buckle without warning.  Other times he was able to walk.  He is currently residing in a nursing facility and the goal is to get him home, but his rehab has been hampered by the unpredictable exercise tolerance and participation. Around Christmas time he suffered a urinary tract infection which set him back quite a bit, on January 2 he was admitted to hospital he required IV antibiotics, on the fifth he was discharged  from hospital to a rehab-nursing home, where he still stays.  While his admission diagnosis was a urosepsis-urinary tract infection he will now need a new diagnosis to continue with rehab.  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  He had 2 replacement of the left knee with in a month, dating back to the year 2012 be relieved.  There has been no surgery to the right leg neither hip nor knee have been no back surgeries or spinal interventions. The myoclonic jerks are much more dominantly expressed with the right lower extremity, but his knees buckle on both sides but not necessarily at the same time. His  feet  don't lift well of the ground, especially the right.     01 May 2017.  He has again been a very compliant CPAP use of his 100% of the last 30 days daily use and 80% by hours.  Average use at times 4 hours 42 minutes, the patient uses an AutoSet between 12 and 18 cmH2O was wanted me to EPR is a residual AHI of 11.1.  The vast majority of these residual apneas are obstructive in nature usually means that he does need a little bit more pressure.  The 95th percentile pressure was 17.5 cm water.  I would like to increase the maximum pressure to 20 cm was 2 cm EPR.  He will stay on the memory foam lined mask the so-called air touch interface.  He likes it and it has reduced air leaks significantly.  He reports that his spouse still feels that there is too much air leak however it has not woken him up she is waking him up.  His fatigue severity score today was endorsed to 22 points but his Epworth sleepiness score is still high at 15 points, the geriatric depression score was endorsed at only 1 out of 15 points. He is happy with his results, more alertness.    10-30-2016, [name] has been, as usual, and excellent compliant patient. He has used CPAP was 90% compliance and an average of 5 hours and 90 minutes daily, uses an AutoSet between 12 and 18 cm water was 1 cm EPR and hasn't residual apnea index of 5.8. This is a little higher than I like it's but I think it is related to higher air leaks. The 95th percentile pressure was 17.1, which is an unusual high pressure needs. The air leaks were remarkably high. The patient sometimes is aware at night that there is an air leak but not every night I would like for him to be refitted to a different interface. I would like him to try an air touch fullface mask, which has no resting point on the forehead. He also endorsed 16 points on his Epworth Sleepiness Scale which is too high, and 43 points on the fatigue. His geriatric depression score on the other hand  was only endorsed at one out of 15 points. He continues to use multiple medications including ropinirole 1 mg in the morning and 4 mg at night for restless leg control. Carbidopa levodopa2 times a day for RLS, he also takes metoprolol, levo-thyroxin,  Rizatriptan,  and allopurinol. Various over-the-counter medication-nutritional supplements. RLS goes away after sinemet intake. 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  Revisit note from 07/01/2015. I me today with [name], who has an excellent compliance on his CPAP  S 9 machine. We interrogated the machine by wife 5 today here in the office and it revealed that  his AHI is 6.7 with 100% compliance for days of use and an average user time of 6 hours and 6 minutes. He is using an AutoSet between 12 and 18 cm water pressure was 1 cm EPR the 95th percentile pressure is 16. He does have sometimes significant air leaks that the mask leak he is using a full face mask as evident by the pressure marks at least on his face.he cannot tolerate nasal airflow.   He has bronchitic breathing and has been short of air while talking.  His gout remains well controlled, but her mentioned how expensive his medications are , needed to achieve this control.  He reports restless legs and filled the Johns-Hopkins Questionnaire for quality of life in RLS.  The patient has been long time established, and  underwent a split study in 2011 at [place]. His AHI was 77 per hour and he was titrated to 16 cm water pressure with a 3 cm EPR. The residual AHI became lower than 5. A download reorder from August 2013 showed 100% compliance. I was unable today to obtain the data for 2014 but asked Mr. Nunn 'es DME - [facility] here in [place]) to provide [place] with the most recent data. He got this download per [name] today.  Today the download shows been 96% compliance over the last 90 days, average time of CPAP use per day at 6 hours and 52 minutes, residual AHI is 6.9 and the sac  pressure is 16 cm water with 3 cm EPR there is a moderately high air leak, the pressure is so high his mask blows off his face. He needs a new mask and headgear soon.  I would like for him to have an auto-titrator machine with a setting from 8 through 16 cm water . He states he never sleeps without his CPAP and likes the machines effect on his sleep, rest, agility.  He has 2- 3 nocturia breaks interrupting his sleep. Bedtime is between 23 hours and midnight. Subjective getting 7-8 hours of sleep , wakes up spontaneously at 7 AM.         Review of Systems: Out of a complete 14 system review, the patient complains of only the following symptoms, and all other reviewed systems are negative.  SOB, fatigue reduced. GDS one points,  How likely are you to doze in the following situations: 0 = not likely, 1 = slight chance, 2 = moderate chance, 3 = high chance  Sitting and Reading? 2 Watching Television?3 Sitting inactive in a public place (theater or meeting)?2 Lying down in the afternoon when circumstances permit?2 Sitting and talking to someone?1 Sitting quietly after lunch without alcohol?1 In a car, while stopped for a few minutes in traffic?0 As a passenger in a car for an hour without a break?1  Total =17 before CPAP- now 12 .FSS 38 points, shoulder pain keeps him from sleeping.   He has been more up and about now in the nursing home with rehab activities, but he has noticed both knees to buckle sometimes both at the same time sometimes 1, he has myoclonic jerks in the right leg seem to be more hip flexion jerks the hip flexor is affected, this is not a knee extension jerk.  And he does have problems to lift the right foot.  There is not much strength dorsal or plantar flexion.  It seems that the range of motion at the ankle was reduced.  Left foot is much more strong.  He adjusted his diet , his wife no longer [name] fried foods, more whole wheat , salads.  His son is a Child psychotherapist and  supplies the couple with whole wheat .  Breakfast with caffeine , 2-3 cups of coffee. Non smoker,  Non drinker.      Social History   Socioeconomic History  . Marital status: Married    Spouse name: [name]  . Number of children: 2  . Years of education: COLLEGE  . Highest education level: Not on file  Occupational History    Employer: RETIRED  Social Needs  . Financial resource strain: Not on file  . Food insecurity:    Worry: Not on file    Inability: Not on file  . Transportation needs:    Medical: Not on file    Non-medical: Not on file  Tobacco Use  . Smoking status: Former Smoker    Years: 40.00    Types: Cigars, Cigarettes    Last attempt to quit: 06/19/2005    Years since quitting: 12.9  . Smokeless tobacco: Never Used  . Tobacco comment: smoked 4-6 cigars daily, only smoked cigarettes socially  Substance and Sexual Activity  . Alcohol use: No  . Drug use: No  . Sexual activity: Not on file  Lifestyle  . Physical activity:    Days per week: Not on file    Minutes per session: Not on file  . Stress: Not on file  Relationships  . Social connections:    Talks on phone: Not on file    Gets together: Not on file    Attends religious service: Not on file    Active member of club or organization: Not on file    Attends meetings of clubs or organizations: Not on file    Relationship status: Not on file  . Intimate partner violence:    Fear of current or ex partner: Not on file    Emotionally abused: Not on file    Physically abused: Not on file    Forced sexual activity: Not on file  Other Topics Concern  . Not on file  Social History Narrative   Patient is married [name]) and lives at home with his wife.   Patient has two children.   Patient is retired.   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   Patient is right-handed.   Patient drinks three cups of coffee per day.    Family History  Problem Relation Age of Onset  . COPD Mother   .  Hyperlipidemia Mother   . Hypertension Mother   . Diabetes Father   . Kidney disease Father        ESRD  . Hypertension Father   . Cancer Father   . Hyperlipidemia Father     Past Medical History:  Diagnosis Date  . Arthritis   . Barrett esophagus   . Bronchitis   . CAD (coronary artery disease)    pt's wife denies any cardiac history on pt.  . Cancer ([place])    thyroid  . Carotid artery occlusion   . Constipation   . COPD (chronic obstructive pulmonary disease) ([place])   . Diabetes mellitus without complication ([place])   . Dyspnea   . GERD (gastroesophageal reflux disease)   . Headache    migraines  . History of thyroid cancer 2008  . History of thyroid cancer   . Hypertension   . OSA (obstructive sleep apnea)   . Pneumonia   .  Restless leg   . S/P tendon repair    left thumb  . Sleep apnea with use of continuous positive airway pressure (CPAP)    2011 [facility] , AHI  77cn central and obstrcutive. 16 cm water , 3 cm EPR.   . Thoracic ascending aortic aneurysm (HCC)    4.2 cm ascending TAA 08/18/17 CTA. Annual imaging recommended.  . Thyroid disease   . Ulcer    Peptic ulcer disease    Past Surgical History:  Procedure Laterality Date  . ANTERIOR CERVICAL DECOMP/DISCECTOMY FUSION N/A 09/03/2017   Procedure: ACDF - C3-C4 - C4-C5 - C5-C6;  Surgeon: Kary Kos, MD;  Location: [facility];  Service: Neurosurgery;  Laterality: N/A;  . CAROTID ENDARTERECTOMY Left January 03, 2006   Dr. Amedeo Plenty  . COLONOSCOPY    . EYE SURGERY Bilateral    cataract surgery with lens implants  . game keepers thumb Right   . JOINT REPLACEMENT Left    knee X 2  . ROTATOR CUFF REPAIR Right   . THYROIDECTOMY  2008    Current Outpatient Medications  Medication Sig Dispense Refill  . acetaminophen (TYLENOL) 325 MG tablet Take 2 tablets (650 mg total) by mouth every 4 (four) hours as needed for mild pain ((score 1 to 3) or temp > 100.5). 40 tablet 0  . allopurinol (ZYLOPRIM) 300 MG tablet Take 300  mg by mouth daily.   12  . baclofen (LIORESAL) 10 MG tablet Take 1 tablet (10 mg total) by mouth 3 (three) times daily. 90 each 2  . calcium citrate-vitamin D (CITRACAL+D) 315-200 MG-UNIT per tablet Take 1 tablet by mouth daily. 750 mg     . carbidopa-levodopa (SINEMET IR) 10-100 MG tablet Take 1 tablet by mouth 2 (two) times daily. (Patient taking differently: Take 1 tablet by mouth 3 (three) times daily. Take 1 tablet in the morning about 9am, 1 tablet between Noon & 1, then take 1 tablet at night at 8pm) 90 tablet 4  . cholecalciferol (VITAMIN D) 1000 units tablet Take 1,000 Units by mouth daily.     . cloNIDine (CATAPRES) 0.1 MG tablet Take 0.1 mg by mouth 2 (two) times daily.  8  . COLCRYS 0.6 MG tablet Take 0.6 mg by mouth daily.    . fish oil-omega-3 fatty acids 1000 MG capsule Take 3 g by mouth daily.     [name] guaifenesin (HUMIBID E) 400 MG TABS tablet Take 400 mg by mouth 2 (two) times daily as needed (congestion).    [name] HYDROcodone-acetaminophen (NORCO/VICODIN) 5-325 MG tablet Take 1 tablet by mouth 3 (three) times daily as needed (pain). 30 tablet 0  . levothyroxine (SYNTHROID, LEVOTHROID) 200 MCG tablet Take 200 mcg by mouth daily before breakfast.     . losartan-hydrochlorothiazide (HYZAAR) 100-25 MG per tablet Take 1 tablet by mouth daily.    [name] menthol-cetylpyridinium (CEPACOL) 3 MG lozenge Take 1 lozenge (3 mg total) by mouth as needed for sore throat (sore throat). 100 tablet 12  . metFORMIN (GLUCOPHAGE) 500 MG tablet Take 1 tablet (500 mg total) by mouth 2 (two) times daily with a meal.  0  . metoprolol tartrate (LOPRESSOR) 50 MG tablet Take 50 mg by mouth 2 (two) times daily.    . Multiple Vitamin (MULTIVITAMIN) tablet Take 1 tablet by mouth daily.    . nitroGLYCERIN (NITROSTAT) 0.4 MG SL tablet Place 0.4 mg every 5 (five) minutes as needed under the tongue for chest pain.    [name] oxymetazoline (  AFRIN) 0.05 % nasal spray Place 2 sprays into the nose 2 (two) times daily as needed for  congestion.     . pantoprazole (PROTONIX) 40 MG tablet Take 40 mg by mouth every evening.    . polyethylene glycol (MIRALAX / GLYCOLAX) packet Take 17 g by mouth daily as needed for moderate constipation. 14 each 0  . rizatriptan (MAXALT) 10 MG tablet Take 10 mg by mouth as needed for migraine. May repeat in 2 hours if needed    . rOPINIRole (REQUIP) 1 MG tablet Take 1 mg by mouth every morning.    [name] rOPINIRole (REQUIP) 4 MG tablet Take 4 mg by mouth at bedtime.    . simvastatin (ZOCOR) 40 MG tablet   0  . TRAVATAN Z 0.004 % SOLN ophthalmic solution Place 1 drop into both eyes.     . TRELEGY ELLIPTA 100-62.5-25 MCG/INH AEPB   4   No current facility-administered medications for this visit.     Allergies as of 05/20/2018  . (No Known Allergies)    Vitals: BP 140/82   Pulse 82   Ht 5\' 7"  (1.702 m)   Wt 203 lb (92.1 kg)   BMI 31.79 kg/m  Last Weight:  Wt Readings from Last 1 Encounters:  05/20/18 203 lb (92.1 kg)   Last Height:   Ht Readings from Last 1 Encounters:  05/20/18 5\' 7"  (1.702 m)    Physical exam:  General: The patient is awake, alert and appears not in acute distress. The patient is well groomed. Head: Normocephalic, atraumatic.  Mallampati 1 wide open- very red upper airway( GERD )with a  neck circumference of  20 inches ,  nasal septal deviation, but chronic rhinitis. mouth breather.  Tonsillectomy at age 54.  Cardiovascular:  Regular rate and rhythm.    Neurologic exam : The patient is awake and alert, oriented to place and time.   Cranial nerves: No change in taste or smell. Pupils are equal and briskly reactive to light. Hearing to finger rub intact bilaterally . Facial sensation intact to fine touch.  Facial motor strength is symmetric, tongue and uvula move midline.  Motor exam:  No cogwheeling , no tremor. He has been more up and about now in the nursing home with rehab activities, but he has noticed both knees to buckle sometimes both at the same time  sometimes one , he has myoclonic jerks in the right leg- these  seem to be more hip flexion jerks -the hip flexor is affected, this is not a knee extension jerk.   And he does have problems to lift the right foot.  There is not much strength dorsal or plantar flexion.  It seems that the range of motion at the ankle was reduced.  Left foot is much more strong.  Sensory exam reveals numbness in the fingertips but profound numbness to vibration and pressure in both feet , ascending above  the ankle level.  The bottom of the feet , the bCK OF THE FEET and toes are numb to these modalities.  There is no significant ankle edema.  Deep tendon reflexes:  Triceps reflex is brisk, biceps 1 plus, patella 1 plus- no clonus ! 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 .   1)myoclonus -Baclofen has helped _.  He sleeps better at night. \RLS may not  be RLS at all. I didn't refill Requip- reduce from 4 mg to 2 mg at night - if tolerated will try to wean you.   2) OSA on CPAP- needs an Airtouch mask,   3)  still in PT , Rehab until February 2020.    Assessment:  Visit duration 40 minutes -  Cc Dr. Burman Nieves Antonetta Clanton, MD   05-20-2018

## 2018-05-20 NOTE — Patient Instructions (Addendum)
Requip- reduce from 4 mg to 2 mg at night - if tolerated will try to wean you.   I increased Baclofen / Lioresal to 15 mg tid.   I wanted to draw CMET and CBC diff., but he already had 05-14-2018 at Dr. Shon Baton office. Will ask for a copy.    Cesar Seat, MD  Spinal Stenosis Spinal stenosis occurs when the open space (spinal canal) between the bones of your spine (vertebrae) narrows, putting pressure on the spinal cord or nerves. What are the causes? This condition is caused by areas of bone pushing into the central canals of your vertebrae. This condition may be present at birth (congenital), or it may be caused by:  Arthritic deterioration of your vertebrae (spinal degeneration). This usually starts around age 59.  Injury or trauma to the spine.  Tumors in the spine.  Calcium deposits in the spine.  What are the signs or symptoms? Symptoms of this condition include:  Pain in the neck or back that is generally worse with activities, particularly when standing and walking.  Numbness, tingling, hot or cold sensations, weakness, or weariness in your legs.  Pain going up and down the leg (sciatica).  Frequent episodes of falling.  A foot-slapping gait that leads to muscle weakness.  In more serious cases, you may develop:  Problemspassing stool or passing urine.  Difficulty having sex.  Loss of feeling in part or all of your leg.  Symptoms may come on slowly and get worse over time. How is this diagnosed? This condition is diagnosed based on your medical history and a physical exam. Tests will also be done, such as:  MRI.  CT scan.  X-ray.  How is this treated? Treatment for this condition often focuses on managing your pain and any other symptoms. Treatment may include:  Practicing good posture to lessen pressure on your nerves.  Exercising to strengthen muscles, build endurance, improve balance, and maintain good joint movement (range of  motion).  Losing weight, if needed.  Taking medicines to reduce swelling, inflammation, or pain.  Assistive devices, such as a corset or brace.  In some cases, surgery may be needed. The most common procedure is decompression laminectomy. This is done to remove excess bone that puts pressure on your nerve roots. Follow these instructions at home: Managing pain, stiffness, and swelling  Do all exercises and stretches as told by your health care provider.  Practice good posture. If you were given a brace or a corset, wear it as told by your health care provider.  Do not do any activities that cause pain. Ask your health care provider what activities are safe for you.  Do not lift anything that is heavier than 10 lb (4.5 kg) or the limit that your health care provider tells you.  Maintain a healthy weight. Talk with your health care provider if you need help losing weight.  If directed, apply heat to the affected area as often as told by your health care provider. Use the heat source that your health care provider recommends, such as a moist heat pack or a heating pad. ? Place a towel between your skin and the heat source. ? Leave the heat on for 20-30 minutes. ? Remove the heat if your skin turns bright red. This is especially important if you are not able to feel pain, heat, or cold. You may have a greater risk of getting burned. General instructions  Take over-the-counter and prescription medicines only as told by  your health care provider.  Do not use any products that contain nicotine or tobacco, such as cigarettes and e-cigarettes. If you need help quitting, ask your health care provider.  Eat a healthy diet. This includes plenty of fruits and vegetables, whole grains, and low-fat (lean) protein.  Keep all follow-up visits as told by your health care provider. This is important. Contact a health care provider if:  Your symptoms do not get better or they get worse.  You have  a fever. Get help right away if:  You have new or worse pain in your neck or upper back.  You have severe pain that cannot be controlled with medicines.  You are dizzy.  You have vision problems, blurred vision, or double vision.  You have a severe headache that is worse when you stand.  You have nausea or you vomit.  You develop new or worse numbness or tingling in your back or legs.  You have pain, redness, swelling, or warmth in your arm or leg. Summary  Spinal stenosis occurs when the open space (spinal canal) between the bones of your spine (vertebrae) narrows. This narrowing puts pressure on the spinal cord or nerves.  Spinal stenosis can cause numbness, weakness, or pain in the neck, back, and legs.  This condition may be caused by a birth defect, arthritic deterioration of your vertebrae, injury, tumors, or calcium deposits.  This condition is usually diagnosed with MRIs, CT scans, and X-rays. This information is not intended to replace advice given to you by your health care provider. Make sure you discuss any questions you have with your health care provider. Document Released: 08/26/2003 Document Revised: 05/10/2016 Document Reviewed: 05/10/2016 Elsevier Interactive Patient Education  [employer].

## 2018-05-21 NOTE — Therapy (Signed)
[facility] [address] [place], [place], [address] Phone: [phone]   Fax:  [phone]  Physical Therapy Treatment  Patient Details  Name: [name] MRN: [id number] Date of Birth: Nov 05, 1940 Referring Provider (PT): Larey Seat, MD   Encounter Date: 05/20/2018  PT End of Session - 05/20/18 1843    Visit Number  26    Number of Visits  76    Date for PT Re-Evaluation  08/09/18    Authorization Type  [employer] Pt has met $5900 oop, full coverage, 10th visit PN    PT Start Time  1445    PT Stop Time  1530    PT Time Calculation (min)  45 min    Equipment Utilized During Treatment  Gait belt    Activity Tolerance  Other (comment);Patient tolerated treatment well;Patient limited by fatigue   limited by flexor tone   Behavior During Therapy  [facility] for tasks assessed/performed       Past Medical History:  Diagnosis Date  . Arthritis   . Barrett esophagus   . Bronchitis   . CAD (coronary artery disease)    pt's wife denies any cardiac history on pt.  . Cancer ([place])    thyroid  . Carotid artery occlusion   . Constipation   . COPD (chronic obstructive pulmonary disease) ([place])   . Diabetes mellitus without complication ([place])   . Dyspnea   . GERD (gastroesophageal reflux disease)   . Headache    migraines  . History of thyroid cancer 2008  . History of thyroid cancer   . Hypertension   . OSA (obstructive sleep apnea)   . Pneumonia   . Restless leg   . S/P tendon repair    left thumb  . Sleep apnea with use of continuous positive airway pressure (CPAP)    2011 [facility] , AHI  77cn central and obstrcutive. 16 cm water , 3 cm EPR.   . Thoracic ascending aortic aneurysm (HCC)    4.2 cm ascending TAA 08/18/17 CTA. Annual imaging recommended.  . Thyroid disease   . Ulcer    Peptic ulcer disease    Past Surgical History:  Procedure Laterality Date  . ANTERIOR CERVICAL DECOMP/DISCECTOMY FUSION  N/A 09/03/2017   Procedure: ACDF - C3-C4 - C4-C5 - C5-C6;  Surgeon: Kary Kos, MD;  Location: [facility];  Service: Neurosurgery;  Laterality: N/A;  . CAROTID ENDARTERECTOMY Left January 03, 2006   Dr. Amedeo Plenty  . COLONOSCOPY    . EYE SURGERY Bilateral    cataract surgery with lens implants  . game keepers thumb Right   . JOINT REPLACEMENT Left    knee X 2  . ROTATOR CUFF REPAIR Right   . THYROIDECTOMY  2008    There were no vitals filed for this visit.  Subjective Assessment - 05/20/18 1451    Subjective  His bladder infection is better so spasms are better except forgot to take Baclofen today. He has been standing at sink and sitting on 24" bar stool but only when CNA is there to help. He has been doing leg ergometer.     Patient is accompained by:  Family member    Pertinent History  CAD, progressive muscular dystrophy, DM2, COPD, hypoxic respiratory failure, HTN,    Patient Stated Goals  To walk in home & community, wife wants him to be able to care for himself if something happens to her    Currently in Pain?  No/denies                       [facility] Adult PT Treatment/Exercise - 05/20/18 1445      Transfers   Transfers  Sit to Stand;Stand to Sit;Squat Pivot Transfers    Sit to Stand  4: Min assist;With upper extremity assist;With armrests;From chair/3-in-1;4: Min guard   to RW, Min guard to sink   Sit to Stand Details  Tactile cues for weight shifting;Verbal cues for technique    Stand to Sit  4: Min assist;With upper extremity assist;With armrests;To chair/3-in-1;5: Supervision   from RW, supervision from sink   Stand to Sit Details (indicate cue type and reason)  Tactile cues for weight shifting;Verbal cues for technique    Squat Pivot Transfers  --      Ambulation/Gait   Ambulation/Gait  Yes    Ambulation/Gait Assistance  3: Mod assist   2nd person for safety but only contact assist   Ambulation/Gait Assistance Details  verbal & tactile cues on upright posture,  step length & wt shift over stance LE    Ambulation Distance (Feet)  35 Feet   35', 25' X 2   Assistive device  Rolling walker    Gait Pattern  Step-to pattern;Decreased step length - left;Decreased stride length;Right foot flat;Left foot flat;Right flexed knee in stance;Left flexed knee in stance;Trunk flexed;Poor foot clearance - left;Poor foot clearance - right    Ambulation Surface  Indoor;Level    Gait Comments  --      High Level Balance   High Level Balance Activities  Side stepping;Backward walking   RW support with modA with 2nd person contact assist   High Level Balance Comments  demo & verbal cues on technique and wt shift      Neuro Re-ed    Neuro Re-ed Details   standing at sink: stepping single step back, right & left and reaching to cabinet at eye level & trunk rotation to look over shoulder. Pt & wife verbalize understanding to add to HEP.       Exercises   Exercises  --      Knee/Hip Exercises: Machines for Strengthening   Total Gym Leg Press  Bilat LE x 10 reps at 80 lb with therapist assisting with maintaining R foot placement on plate and cues for controlled flexion back to starting position.  Also performed 6 reps each single leg press 40lb on LLE, 35lb RLE               PT Short Term Goals - 05/13/18 1800      PT SHORT TERM GOAL #1   Title  Patient & wife demonstrate & verbalize understanding of updated HEP. (All updated STGs Target Date: 06/11/2018)    Time  1    Period  Months    Status  On-going    Target Date  06/11/18      PT SHORT TERM GOAL #2   Title  Patient performs squat pivot transfers using simulated bed rail right & left between surfaces 2" difference with contact assist.    Time  1    Period  Months    Status  Revised    Target Date  06/11/18      PT SHORT TERM GOAL #3   Title  Patient & wife report understanding of bed positioning recommendations to minimize hypertonic LE issues.    Time  1    Period  Months  Status  New     Target Date  06/11/18      PT SHORT TERM GOAL #4   Title  Sit to/from stand w/c to RW with minA for 3 reps.     Time  1    Period  Months    Status  Revised    Target Date  06/11/18      PT SHORT TERM GOAL #5   Title  Patient ambulates 38' with RW with moderate assist.     Time  1    Status  On-going    Target Date  06/11/18        PT Long Term Goals - 05/15/18 1625      PT LONG TERM GOAL #1   Title  Patient & wife demonstrate & verbalize understanding of ongoing exercise / fitness plan including community programs. (All LTGs 08/09/2018)    Time  3    Period  Months    Status  On-going    Target Date  08/09/17      PT LONG TERM GOAL #2   Title  w/c to bed (mat at similar ht to his bed) squat pivot transfer modified independent.    Time  3    Period  Months    Status  Revised    Target Date  08/09/17      PT LONG TERM GOAL #3   Title  Patient and wife verbalize understanding of recommendations to manage spasms / hypertonic motions.     Time  3    Period  Months    Status  New    Target Date  08/09/17      PT LONG TERM GOAL #4   Title  Sit to/from stand chairs with armrests to RW with supervision.    Time  3    Period  Months    Status  On-going    Target Date  08/09/17      PT LONG TERM GOAL #5   Title  Patient standing balance with RW with BUE support staticly 2 minutes, scans environment and reaches within length of arm for objects with supervision.     Time  3    Period  Months    Status  On-going    Target Date  08/09/17      PT LONG TERM GOAL #6   Title  Patient ambulates 70' with RW with minA.     Time  3    Period  Months    Status  On-going    Target Date  08/09/17            Plan - 05/20/18 1844    Clinical Impression Statement  PT added stepping backwards & sideways with RW & standing at sink. Patient improved gait to increase distance to 35' today.     Rehab Potential  Good    Clinical Impairments Affecting Rehab Potential  weakness &  ROM impaired 4 extremities & trunk; dependency transfers, bed mobility, standing & gait    PT Frequency  2x / week    PT Duration  Other (comment)   13 weeks, 90 days   PT Treatment/Interventions  ADLs/Self Care Home Management;Canalith Repostioning;Moist Heat;DME Instruction;Gait training;Stair training;Functional mobility training;Therapeutic activities;Therapeutic exercise;Balance training;Neuromuscular re-education;Patient/family education;Manual techniques;Passive range of motion;Vestibular    PT Next Visit Plan  work towards updated STGs.  continue with leg press machine    PT Home Exercise Plan  Access Code: [email or web address]     Consulted  and Agree with Plan of Care  Patient;Family member/caregiver    Family Member Consulted  wife       Patient will benefit from skilled therapeutic intervention in order to improve the following deficits and impairments:  Abnormal gait, Cardiopulmonary status limiting activity, Decreased activity tolerance, Decreased balance, Decreased endurance, Decreased knowledge of use of DME, Decreased mobility, Decreased range of motion, Decreased strength, Difficulty walking, Dizziness, Impaired flexibility, Impaired UE functional use, Postural dysfunction  Visit Diagnosis: Abnormal posture  Unsteadiness on feet  Other abnormalities of gait and mobility  Chronic midline low back pain without sciatica  Muscle weakness (generalized)  Other symptoms and signs involving the nervous system  Other symptoms and signs involving the musculoskeletal system  Contracture of muscle, multiple sites     Problem List Patient Active Problem List   Diagnosis Date Noted  . Postural urinary incontinence 05/20/2018  . 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 Myoclonic jerking 05/20/2018  . Myelopathy ([place]) 09/03/2017  . Acute respiratory failure with hypoxemia ([place]) 08/18/2017  . Hypokalemia 08/18/2017  . Leukocytosis  08/18/2017  . Muscle atrophy of lower extremity 08/09/2017  . Bowel and bladder incontinence 08/09/2017  . Right-sided muscle weakness 08/09/2017  . Neuropathy 08/09/2017  . 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/2017  . Abnormality of gait 07/12/2017  . Myoclonic jerkings, 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/2017  . Acquired right foot drop 07/12/2017  . UTI (urinary tract infection) due to Enterococcus 07/12/2017  . Pressure injury of skin 06/21/2017  . Acute cystitis 06/20/2017  . Generalized weakness 06/20/2017  . Dehydration 06/20/2017  . Dyspnea 12/26/2016  . Chronic cough 12/26/2016  . Hypersomnia with sleep apnea 10/30/2016  . CSA (central sleep apnea) 10/30/2016  . Wheezing symptom 10/30/2016  . CPAP/BiPAP dependent 07/01/2015  . 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 RLS (restless legs syndrome) 08/25/2014  . Primary gout 08/25/2014  . OSA on CPAP 08/25/2014  . Severe obesity (BMI >= 40) ([place]) 08/25/2014  . Obesity (BMI 30-39.9) 02/18/2013  . Sleep apnea with use of continuous positive airway pressure (CPAP)   . 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 S/P TKR (total knee replacement) 11/29/2012  . History of thyroid cancer     Mubarak Bevens PT, DPT 05/21/2018, 6:46 PM  [facility] [address] [place], [place], [address] Phone: [phone]   Fax:  [phone]  Name: [name] MRN: [id number] Date of Birth: 1940-06-28

## 2018-05-23 ENCOUNTER — Ambulatory Visit: Payer: Medicare Other | Admitting: Occupational Therapy

## 2018-05-24 ENCOUNTER — Encounter: Payer: Self-pay | Admitting: Occupational Therapy

## 2018-05-24 ENCOUNTER — Ambulatory Visit: Payer: Medicare Other | Admitting: Occupational Therapy

## 2018-05-24 DIAGNOSIS — R2681 Unsteadiness on feet: Secondary | ICD-10-CM

## 2018-05-24 DIAGNOSIS — M25612 Stiffness of left shoulder, not elsewhere classified: Secondary | ICD-10-CM

## 2018-05-24 DIAGNOSIS — R293 Abnormal posture: Secondary | ICD-10-CM

## 2018-05-24 DIAGNOSIS — M6281 Muscle weakness (generalized): Secondary | ICD-10-CM

## 2018-05-24 DIAGNOSIS — R29898 Other symptoms and signs involving the musculoskeletal system: Secondary | ICD-10-CM

## 2018-05-24 DIAGNOSIS — R29818 Other symptoms and signs involving the nervous system: Secondary | ICD-10-CM

## 2018-05-24 NOTE — Therapy (Signed)
[facility] [address] [place], [place], [address] Phone: [phone]   Fax:  [phone]  Occupational Therapy Treatment  Patient Details  Name: [name] MRN: [id number] Date of Birth: 1940/12/08 No data recorded  Encounter Date: 05/24/2018  OT End of Session - 05/24/18 1643    Visit Number  12    Number of Visits  16    Date for OT Re-Evaluation  05/21/18    Authorization Type  blue medicare will need PN every 10th visit    Authorization Time Period  84    Authorization - Visit Number  11    Authorization - Number of Visits  20    OT Start Time  1446    OT Stop Time  1530    OT Time Calculation (min)  44 min    Activity Tolerance  Patient tolerated treatment well    Behavior During Therapy  [facility] for tasks assessed/performed       Past Medical History:  Diagnosis Date  . Arthritis   . Barrett esophagus   . Bronchitis   . CAD (coronary artery disease)    pt's wife denies any cardiac history on pt.  . Cancer ([place])    thyroid  . Carotid artery occlusion   . Constipation   . COPD (chronic obstructive pulmonary disease) ([place])   . Diabetes mellitus without complication ([place])   . Dyspnea   . GERD (gastroesophageal reflux disease)   . Headache    migraines  . History of thyroid cancer 2008  . History of thyroid cancer   . Hypertension   . OSA (obstructive sleep apnea)   . Pneumonia   . Restless leg   . S/P tendon repair    left thumb  . Sleep apnea with use of continuous positive airway pressure (CPAP)    [facility] , AHI  77cn central and obstrcutive. 16 cm water , 3 cm EPR.   . Thoracic ascending aortic aneurysm (HCC)    4.2 cm ascending TAA 08/18/17 CTA. Annual imaging recommended.  . Thyroid disease   . Ulcer    Peptic ulcer disease    Past Surgical History:  Procedure Laterality Date  . ANTERIOR CERVICAL DECOMP/DISCECTOMY FUSION N/A 09/03/2017   Procedure: ACDF - C3-C4 - C4-C5 -  C5-C6;  Surgeon: Kary Kos, MD;  Location: [facility];  Service: Neurosurgery;  Laterality: N/A;  . CAROTID ENDARTERECTOMY Left January 03, 2006   Dr. Amedeo Plenty  . COLONOSCOPY    . EYE SURGERY Bilateral    cataract surgery with lens implants  . game keepers thumb Right   . JOINT REPLACEMENT Left    knee X 2  . ROTATOR CUFF REPAIR Right   . THYROIDECTOMY  2008    There were no vitals filed for this visit.  Subjective Assessment - 05/24/18 1451    Subjective   Patient tried per MD direction to increase Baclofen, however, very sleepy on new dose, and difficult to arouse this morning.     Patient is accompained by:  Family member    Pertinent History  ACDF C3- C6 09/03/2017. Pt began with LE weakness 05/2017.   Pt has been wheelchair bound starting in 06/2017. Pt in SNF for a month, then went home.  Pt then had ACDF C3- C6 09/03/2017 pt then d/c to SNF until 11/29/2017 for PT, OT and ST.  Pt discharged home with HHPT and OT which ended right before pt started with  PT here in this clinic.  PMH:    Patient Stated Goals  get my arms working better and move better    Currently in Pain?  No/denies    Pain Score  0-No pain                   OT Treatments/Exercises (OP) - 05/24/18 0001      ADLs   Cooking  Worked on sit to stand in kitchen at [employer], Patient able to lift off chair with no assist, and only min assist to finish stand with hands on flat surface.  Stood at sink to wash dishes, using lower stool to rest as needed.  Patient able to transition up to standing without help from stool, and free at least one hand at a time for functional task.        Neck Exercises: Machines for Strengthening   UBE (Upper Arm Bike)  x 5 min @ level 2.               OT Education - 05/24/18 1643    Education Details  Simple kitchen standing activities - one and two handed    Person(s) Educated  Patient;Spouse    Methods  Explanation;Demonstration;Tactile cues    Comprehension  Verbalized  understanding;Returned demonstration;Need further instruction       OT Short Term Goals - 05/24/18 1645      OT SHORT TERM GOAL #1   Title  Pt and wife will be mod I with UE home activities program focused on UE strenthening, coordination and UE functional use (BUE's) - 04/23/2018    Status  Achieved      OT SHORT TERM GOAL #2   Title  Pt will demonstrate ability for active L shoulder flexion in supine to 125* without pain in prep for overhead functional reach.     Status  Achieved      OT SHORT TERM GOAL #3   Title  Pt will demonstrate ability to pick up light weight object with LUE at mid reach (75* of shoulder flexion).      Status  Achieved      OT SHORT TERM GOAL #4   Title  Pt will demonstrate ability to use reacher to don pants over feet in sitting with min a.    Status  Achieved      OT SHORT TERM GOAL #5   Title  Patient will demonstrate improved coordination as evidenced by reduced time on 9 hole peg test by 10 seconds each hand - to improve ability to manipulate small objects - pills, buttons, etc.      Baseline  right 1 min 19 sec (10/16)   left 58 sec (10/16)    Status  Achieved      OT SHORT TERM GOAL #6   Title  Pt will demonstate improved grip strength by at least 5 pounds total to assist with ADL tasks. (baseline L= 45, R =38)    Status  Not Met        OT Long Term Goals - 05/24/18 1645      OT LONG TERM GOAL #1   Title  Pt and wife will be mod I with upgraded home activities program - 05/21/2018    Status  On-going      OT LONG TERM GOAL #2   Title  Pt will demonstrate ability to comb hair mod I    Status  Achieved      OT LONG TERM  GOAL #3   Title  Pt will be min a for simple hot familiar meal prep    Status  On-going      OT LONG TERM GOAL #4   Title  Pt will be mod I using reacher to don pants over feet.     Status  Achieved      OT LONG TERM GOAL #5   Title  Pt will demonstrate improved grip strength by at least 7 pounds total to assist with  simple cooking tasks (baseline L= 45, R = 38).    Status  On-going      OT LONG TERM GOAL #6   Title  Pt will verbalize understanding of adapted techniques for buttoning and zipping prn    Status  Achieved      OT LONG TERM GOAL #7   Title  Pt will be able to lift light weight object at mid reach during simple cooking tasks with LUE    Status  On-going      OT LONG TERM GOAL #8   Title  Pt will be able to lift 2 pound object bilaterally from mid reach to assist with functional activities at home.     Status  On-going            Plan - 05/24/18 1644    Clinical Impression Statement  Patient showing slow but steady improvement in clinic - needs improved carryover to home    Occupational Profile and client history currently impacting functional performance  husband, friend, reitiree.  PMH:  CAD, DM, COPD, hypoxic respiratory failure, HTN    Occupational performance deficits (Please refer to evaluation for details):  ADL's;IADL's;Leisure;Social Participation;Rest and Sleep    Rehab Potential  Fair    Current Impairments/barriers affecting progress:  length of time since onset, severity of deficits    OT Frequency  2x / week    OT Duration  8 weeks    OT Treatment/Interventions  Self-care/ADL training;Moist Heat;Therapeutic exercise;Neuromuscular education;DME and/or AE instruction;Passive range of motion;Manual Therapy;Therapist, nutritional;Therapeutic activities;Patient/family education;Balance training    Plan  NMR sit to stand supine to sit, standing weight shift, decreased relaince on UE's, mid functional reach.      Clinical Decision Making  Multiple treatment options, significant modification of task necessary    Consulted and Agree with Plan of Care  Patient;Family member/caregiver    Family Member Consulted  wife       Patient will benefit from skilled therapeutic intervention in order to improve the following deficits and impairments:  Decreased activity tolerance,  Decreased balance, Decreased coordination, Decreased range of motion, Decreased mobility, Decreased knowledge of use of DME, Decreased strength, Difficulty walking, Impaired UE functional use, Impaired sensation, Impaired flexibility, Pain  Visit Diagnosis: Abnormal posture  Unsteadiness on feet  Muscle weakness (generalized)  Other symptoms and signs involving the nervous system  Other symptoms and signs involving the musculoskeletal system  Stiffness of left shoulder, not elsewhere classified    Problem List Patient Active Problem List   Diagnosis Date Noted  . Postural urinary incontinence 05/20/2018  . 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 Myoclonic jerking 05/20/2018  . Myelopathy ([place]) 09/03/2017  . Acute respiratory failure with hypoxemia ([place]) 08/18/2017  . Hypokalemia 08/18/2017  . Leukocytosis 08/18/2017  . Muscle atrophy of lower extremity 08/09/2017  . Bowel and bladder incontinence 08/09/2017  . Right-sided muscle weakness 08/09/2017  . Neuropathy 08/09/2017  . Neurogenic claudication  due to lumbar spinal stenosis 08/09/2017  . Abnormality of gait 07/12/2017  . Myoclonic jerkings, 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/2017  . Acquired right foot drop 07/12/2017  . UTI (urinary tract infection) due to Enterococcus 07/12/2017  . Pressure injury of skin 06/21/2017  . Acute cystitis 06/20/2017  . Generalized weakness 06/20/2017  . Dehydration 06/20/2017  . Dyspnea 12/26/2016  . Chronic cough 12/26/2016  . Hypersomnia with sleep apnea 10/30/2016  . CSA (central sleep apnea) 10/30/2016  . Wheezing symptom 10/30/2016  . CPAP/BiPAP dependent 07/01/2015  . 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 RLS (restless legs syndrome) 08/25/2014  . Primary gout 08/25/2014  . OSA on CPAP 08/25/2014  . Severe obesity (BMI >= 40) ([place]) 08/25/2014  . Obesity (BMI 30-39.9) 02/18/2013  . Sleep apnea with use of continuous positive airway  pressure (CPAP)   . 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 S/P TKR (total knee replacement) 11/29/2012  . History of thyroid cancer     Mariah Milling, OTR/L 05/24/2018, 4:47 PM  [facility] [address] [place], [place], [address] Phone: [phone]   Fax:  [phone]  Name: [name] MRN: [id number] Date of Birth: [birth year]

## 2018-05-27 ENCOUNTER — Encounter: Payer: Self-pay | Admitting: Occupational Therapy

## 2018-05-27 ENCOUNTER — Ambulatory Visit: Payer: Medicare Other | Admitting: Occupational Therapy

## 2018-05-27 ENCOUNTER — Ambulatory Visit: Payer: Medicare Other | Admitting: Physical Therapy

## 2018-05-27 DIAGNOSIS — R2681 Unsteadiness on feet: Secondary | ICD-10-CM

## 2018-05-27 DIAGNOSIS — R29898 Other symptoms and signs involving the musculoskeletal system: Secondary | ICD-10-CM

## 2018-05-27 DIAGNOSIS — M6281 Muscle weakness (generalized): Secondary | ICD-10-CM

## 2018-05-27 DIAGNOSIS — R293 Abnormal posture: Secondary | ICD-10-CM

## 2018-05-27 DIAGNOSIS — M25612 Stiffness of left shoulder, not elsewhere classified: Secondary | ICD-10-CM

## 2018-05-27 DIAGNOSIS — R29818 Other symptoms and signs involving the nervous system: Secondary | ICD-10-CM

## 2018-05-27 DIAGNOSIS — R2689 Other abnormalities of gait and mobility: Secondary | ICD-10-CM

## 2018-05-27 NOTE — Patient Instructions (Signed)
We are upgrading your home program today!  Starting today hold a 3 pound weight between both your hands, palms facing each other.  You will do the same exercises:  1. Chest presses - 15 reps, 1 set  2. Overhead reach - 15 reps, 1 set.   Work within the range of motion that is pain free!!  Go slow, go as far as you can and STOP just as you begin to feel any discomfort.   Do this for 1 week.  If there are no problems, you can increase this to two sessions per day (15 in the morning and 15 in the evening of each exercise) You will increase on 12/18 if no issues arise!  Your second assignment is to make a pound cake! Your wife can help with putting it in and taking it out of the oven!

## 2018-05-27 NOTE — Therapy (Signed)
[facility] [address] [place], [place], [address] Phone: [phone]   Fax:  [phone]  Occupational Therapy Treatment  Patient Details  Name: [name] MRN: [id number] Date of Birth: 1940-10-07 No data recorded  Encounter Date: 05/27/2018  OT End of Session - 05/27/18 1626    Visit Number  13    Number of Visits  27    Date for OT Re-Evaluation  07/08/18   renewed on 05/13/2018   Authorization Type  blue medicare will need PN every 10th visit    Authorization Time Period  71    Authorization - Visit Number  13    Authorization - Number of Visits  20    OT Start Time  1316    OT Stop Time  1359    OT Time Calculation (min)  43 min    Activity Tolerance  Patient tolerated treatment well       Past Medical History:  Diagnosis Date  . Arthritis   . Barrett esophagus   . Bronchitis   . CAD (coronary artery disease)    pt's wife denies any cardiac history on pt.  . Cancer ([place])    thyroid  . Carotid artery occlusion   . Constipation   . COPD (chronic obstructive pulmonary disease) ([place])   . Diabetes mellitus without complication ([place])   . Dyspnea   . GERD (gastroesophageal reflux disease)   . Headache    migraines  . History of thyroid cancer 2008  . History of thyroid cancer   . Hypertension   . OSA (obstructive sleep apnea)   . Pneumonia   . Restless leg   . S/P tendon repair    left thumb  . Sleep apnea with use of continuous positive airway pressure (CPAP)    2011 [facility] , AHI  77cn central and obstrcutive. 16 cm water , 3 cm EPR.   . Thoracic ascending aortic aneurysm (HCC)    4.2 cm ascending TAA 08/18/17 CTA. Annual imaging recommended.  . Thyroid disease   . Ulcer    Peptic ulcer disease    Past Surgical History:  Procedure Laterality Date  . ANTERIOR CERVICAL DECOMP/DISCECTOMY FUSION N/A 09/03/2017   Procedure: ACDF - C3-C4 - C4-C5 - C5-C6;  Surgeon: Kary Kos, MD;   Location: [facility];  Service: Neurosurgery;  Laterality: N/A;  . CAROTID ENDARTERECTOMY Left January 03, 2006   Dr. Amedeo Plenty  . COLONOSCOPY    . EYE SURGERY Bilateral    cataract surgery with lens implants  . game keepers thumb Right   . JOINT REPLACEMENT Left    knee X 2  . ROTATOR CUFF REPAIR Right   . THYROIDECTOMY  2008    There were no vitals filed for this visit.  Subjective Assessment - 05/27/18 1320    Subjective   We reduced the amount of baclofen - he took it two days and was too sleepy.  We really aren't back to even the dose we were taking before.     Patient is accompained by:  Family member   wife   Pertinent History  ACDF C3- C6 09/03/2017. Pt began with LE weakness 05/2017.   Pt has been wheelchair bound starting in 06/2017. Pt in SNF for a month, then went home.  Pt then had ACDF C3- C6 09/03/2017 pt then d/c to SNF until 11/29/2017 for PT, OT and ST.  Pt discharged home with HHPT and OT which ended right  before pt started with PT here in this clinic.  PMH:    Patient Stated Goals  get my arms working better and move better    Currently in Pain?  Yes    Pain Score  7     Pain Location  Head    Pain Orientation  Left    Pain Descriptors / Indicators  --   "it hurts thats all I can say"   Pain Type  Acute pain    Pain Onset  Today    Pain Frequency  Constant    Aggravating Factors   weather is trigger for it     Pain Relieving Factors  excedrin migraine - they helped a little.                    OT Treatments/Exercises (OP) - 05/27/18 0001      ADLs   Functional Mobility  Addressed sit to stand (pt able to complete with close supervision and cues only today), stand step transfer using walker (supervision) and controlled descent to surface - pt needed cues.  Pt with signficant improvement in controlled mobility today.  Also addressed sit to supine (pt only requires cues) as well as supine to sit (cues and min a to use RUE to push into sitting from sidelying). Pt  states he uses grab bar on bed at home to roll and sit up -encouraged pt to rolll without bar.    ADL Comments  Assessed goals - see goal updates       Exercises   Exercises  Shoulder      Shoulder Exercises: Supine   Other Supine Exercises  Upgraded home exercise program - see pt education section for details. Pt able to return demostrate and hand out given.              OT Education - 05/27/18 1529    Education Details  upgraded HEP     Person(s) Educated  Patient;Spouse    Methods  Explanation;Demonstration;Handout    Comprehension  Verbalized understanding;Returned demonstration       OT Short Term Goals - 05/27/18 1623      OT SHORT TERM GOAL #1   Title  Pt and wife will be mod I with UE home activities program focused on UE strenthening, coordination and UE functional use (BUE's) - 04/23/2018    Status  Achieved      OT SHORT TERM GOAL #2   Title  Pt will demonstrate ability for active L shoulder flexion in supine to 125* without pain in prep for overhead functional reach.     Status  Achieved      OT SHORT TERM GOAL #3   Title  Pt will demonstrate ability to pick up light weight object with LUE at mid reach (75* of shoulder flexion).      Status  Achieved      OT SHORT TERM GOAL #4   Title  Pt will demonstrate ability to use reacher to don pants over feet in sitting with min a.    Status  Achieved      OT SHORT TERM GOAL #5   Title  Patient will demonstrate improved coordination as evidenced by reduced time on 9 hole peg test by 10 seconds each hand - to improve ability to manipulate small objects - pills, buttons, etc.      Baseline  right 1 min 19 sec (10/16)   left 58 sec (10/16)    Status  Achieved      OT SHORT TERM GOAL #6   Title  Pt will demonstate improved grip strength by at least 5 pounds total to assist with ADL tasks. (baseline L= 45, R =38)    Status  Not Met        OT Long Term Goals - 05/27/18 1624      OT LONG TERM GOAL #1   Title  Pt  and wife will be mod I with upgraded home activities program - 05/21/2018    Status  On-going      OT LONG TERM GOAL #2   Title  Pt will demonstrate ability to comb hair mod I    Status  Achieved      OT LONG TERM GOAL #3   Title  Pt will be min a for simple hot familiar meal prep    Status  On-going      OT LONG TERM GOAL #4   Title  Pt will be mod I using reacher to don pants over feet.     Status  Achieved      OT LONG TERM GOAL #5   Title  Pt will demonstrate improved grip strength by at least 7 pounds total to assist with simple cooking tasks (baseline L= 45, R = 38).    Status  Achieved   05/27/2018  R= 45, L=55     OT LONG TERM GOAL #6   Title  Pt will verbalize understanding of adapted techniques for buttoning and zipping prn    Status  Achieved      OT LONG TERM GOAL #7   Title  Pt will be able to lift light weight object at mid reach during simple cooking tasks with LUE    Status  On-going      OT LONG TERM GOAL #8   Title  Pt will be able to lift 2 pound object bilaterally from mid reach to assist with functional activities at home.     Status  On-going            Plan - 05/27/18 1625    Clinical Impression Statement  Pt making progress toward UE functional use as well as functional mobility.  Pt with improved UE ROM today ( RUE 125* of shoulder flexion, LUE 89* of shoulder flexion).      Occupational Profile and client history currently impacting functional performance  husband, friend, reitiree.  PMH:  CAD, DM, COPD, hypoxic respiratory failure, HTN    Occupational performance deficits (Please refer to evaluation for details):  ADL's;IADL's;Leisure;Social Participation;Rest and Sleep    Rehab Potential  Fair    Current Impairments/barriers affecting progress:  length of time since onset, severity of deficits    OT Frequency  2x / week    OT Duration  8 weeks    OT Treatment/Interventions  Self-care/ADL training;Moist Heat;Therapeutic exercise;Neuromuscular  education;DME and/or AE instruction;Passive range of motion;Manual Therapy;Therapist, nutritional;Therapeutic activities;Patient/family education;Balance training    Plan  NMR sit to stand supine to sit, standing weight shift, decreased relaince on UE's, mid functional reach, check upgraded HEP for UE's, cooking     Consulted and Agree with Plan of Care  Patient;Family member/caregiver    Family Member Consulted  wife       Patient will benefit from skilled therapeutic intervention in order to improve the following deficits and impairments:  Decreased activity tolerance, Decreased balance, Decreased coordination, Decreased range of motion, Decreased mobility, Decreased knowledge of use of DME,  Decreased strength, Difficulty walking, Impaired UE functional use, Impaired sensation, Impaired flexibility, Pain  Visit Diagnosis: Abnormal posture  Unsteadiness on feet  Muscle weakness (generalized)  Other symptoms and signs involving the nervous system  Other symptoms and signs involving the musculoskeletal system  Stiffness of left shoulder, not elsewhere classified    Problem List Patient Active Problem List   Diagnosis Date Noted  . Postural urinary incontinence 05/20/2018  . 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 Myoclonic jerking 05/20/2018  . Myelopathy ([place]) 09/03/2017  . Acute respiratory failure with hypoxemia ([place]) 08/18/2017  . Hypokalemia 08/18/2017  . Leukocytosis 08/18/2017  . Muscle atrophy of lower extremity 08/09/2017  . Bowel and bladder incontinence 08/09/2017  . Right-sided muscle weakness 08/09/2017  . Neuropathy 08/09/2017  . 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/2017  . Abnormality of gait 07/12/2017  . Myoclonic jerkings, 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/2017  . Acquired right foot drop 07/12/2017  . UTI (urinary tract infection) due to Enterococcus 07/12/2017  . Pressure injury of skin  06/21/2017  . Acute cystitis 06/20/2017  . Generalized weakness 06/20/2017  . Dehydration 06/20/2017  . Dyspnea 12/26/2016  . Chronic cough 12/26/2016  . Hypersomnia with sleep apnea 10/30/2016  . CSA (central sleep apnea) 10/30/2016  . Wheezing symptom 10/30/2016  . CPAP/BiPAP dependent 07/01/2015  . 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 RLS (restless legs syndrome) 08/25/2014  . Primary gout 08/25/2014  . OSA on CPAP 08/25/2014  . Severe obesity (BMI >= 40) ([place]) 08/25/2014  . Obesity (BMI 30-39.9) 02/18/2013  . Sleep apnea with use of continuous positive airway pressure (CPAP)   . 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 S/P TKR (total knee replacement) 11/29/2012  . History of thyroid cancer     Quay Burow, OTR/L 05/27/2018, 4:29 PM  [facility] [address] [place], [place], [address] Phone: [phone]   Fax:  [phone]  Name: [name] MRN: [id number] Date of Birth: [birth year]

## 2018-05-28 NOTE — Therapy (Signed)
[facility] [address] [place], [place], [address] Phone: [phone]   Fax:  [phone]  Physical Therapy Treatment  Patient Details  Name: [name] MRN: [id number] Date of Birth: August 06, 1940 Referring Provider (PT): Larey Seat, MD   Encounter Date: 05/27/2018  PT End of Session - 05/28/18 1107    Visit Number  27    Number of Visits  48    Date for PT Re-Evaluation  08/09/18    Authorization Type  [employer] Pt has met $5900 oop, full coverage, 10th visit PN    PT Start Time  1400    PT Stop Time  1442    PT Time Calculation (min)  42 min    Activity Tolerance  Patient tolerated treatment well    Behavior During Therapy  [facility] for tasks assessed/performed       Past Medical History:  Diagnosis Date  . Arthritis   . Barrett esophagus   . Bronchitis   . CAD (coronary artery disease)    pt's wife denies any cardiac history on pt.  . Cancer ([place])    thyroid  . Carotid artery occlusion   . Constipation   . COPD (chronic obstructive pulmonary disease) ([place])   . Diabetes mellitus without complication ([place])   . Dyspnea   . GERD (gastroesophageal reflux disease)   . Headache    migraines  . History of thyroid cancer 2008  . History of thyroid cancer   . Hypertension   . OSA (obstructive sleep apnea)   . Pneumonia   . Restless leg   . S/P tendon repair    left thumb  . Sleep apnea with use of continuous positive airway pressure (CPAP)    [facility] , AHI  77cn central and obstrcutive. 16 cm water , 3 cm EPR.   . Thoracic ascending aortic aneurysm (HCC)    4.2 cm ascending TAA 08/18/17 CTA. Annual imaging recommended.  . Thyroid disease   . Ulcer    Peptic ulcer disease    Past Surgical History:  Procedure Laterality Date  . ANTERIOR CERVICAL DECOMP/DISCECTOMY FUSION N/A 09/03/2017   Procedure: ACDF - C3-C4 - C4-C5 - C5-C6;  Surgeon: Kary Kos, MD;  Location: [facility];  Service:  Neurosurgery;  Laterality: N/A;  . CAROTID ENDARTERECTOMY Left January 03, 2006   Dr. Amedeo Plenty  . COLONOSCOPY    . EYE SURGERY Bilateral    cataract surgery with lens implants  . game keepers thumb Right   . JOINT REPLACEMENT Left    knee X 2  . ROTATOR CUFF REPAIR Right   . THYROIDECTOMY  2008    There were no vitals filed for this visit.  Subjective Assessment - 05/27/18 1407    Subjective  UTI is better but increase in Baclofen caused pt to be too drowsy so he had to come back down on it.  OT discussing with pt best bathroom to use to allow pt to ambulate to toilet instead of use w/c.    Patient is accompained by:  Family member    Pertinent History  CAD, progressive muscular dystrophy, DM2, COPD, hypoxic respiratory failure, HTN,    Patient Stated Goals  To walk in home & community, wife wants him to be able to care for himself if something happens to her    Currently in Pain?  Yes  [facility] Adult PT Treatment/Exercise - 05/27/18 1409      Transfers   Transfers  Sit to Stand;Stand to Sit;Squat Pivot Transfers    Sit to Stand  4: Min assist;3: Mod assist    Sit to Stand Details (indicate cue type and reason)  From mat, transport w/c and arm chair simulating raised toilet seat.  From lower surfaces pt required slightly increased assistance to keep COG forwards over BOS while extending LE and trunk.      Stand to Sit  4: Min guard    Stand to Sit Details  to transport w/c after stand pivot from mat and when performing simulated bathroom transfer w/c <> simulated raised toilet seat    Stand Pivot Transfers  4: Min assist    Stand Pivot Transfer Details (indicate cue type and reason)  Reviewed safe sequence for transfer bed <> w/c with use of RW.  Demonstrated sequence to wife and discussed wife's positioning, assistance level and safety.  Will have wife return demonstrate at next session      Therapeutic Activites    Therapeutic Activities  ADL's     ADL's  Following OT session, continued to reinforce goal of decreasing use of w/c in the home and increasing use of RW for stand pivot transfers bed <> w/c and ambulating into the bathroom for toileting and hygiene.  Performed one rep of stand pivot mat/bed > w/c with min A with wife observing.  Also set up simulated bathroom and reviewed sequence for entering from hallway: stand from w/c in hallway and ambulate with RW forwards, 180 deg pivot and side step to toilet (reverse to exit).  Pt and PT performed with simulated bathroom with wife observing.  Discussed with wife taking RW into bathroom at home and perform pivoting to determine amount of space and her positioning.  Wife agreeable.  Pt states he is fearful of performing at home with his wife.  He is fearful of falling or hurting his wife because she is the only support he has.  Discussed ways to gradually increase independence with transfer: have CNA with them at first, perform only on days when LE demonstrate decreased spasticity, only perform one trip with RW and then utilize w/c for other half, etc.  Pt and wife agreeable.             PT Education - 05/28/18 1107    Education Details  see TA regarding transfers at home    Person(s) Educated  Patient;Spouse    Methods  Explanation;Demonstration    Comprehension  Need further instruction       PT Short Term Goals - 05/13/18 1800      PT SHORT TERM GOAL #1   Title  Patient & wife demonstrate & verbalize understanding of updated HEP. (All updated STGs Target Date: 06/11/2018)    Time  1    Period  Months    Status  On-going    Target Date  06/11/18      PT SHORT TERM GOAL #2   Title  Patient performs squat pivot transfers using simulated bed rail right & left between surfaces 2" difference with contact assist.    Time  1    Period  Months    Status  Revised    Target Date  06/11/18      PT SHORT TERM GOAL #3   Title  Patient & wife report understanding of bed positioning  recommendations to minimize hypertonic LE issues.  Time  1    Period  Months    Status  New    Target Date  06/11/18      PT SHORT TERM GOAL #4   Title  Sit to/from stand w/c to RW with minA for 3 reps.     Time  1    Period  Months    Status  Revised    Target Date  06/11/18      PT SHORT TERM GOAL #5   Title  Patient ambulates 82' with RW with moderate assist.     Time  1    Status  On-going    Target Date  06/11/18        PT Long Term Goals - 05/15/18 1625      PT LONG TERM GOAL #1   Title  Patient & wife demonstrate & verbalize understanding of ongoing exercise / fitness plan including community programs. (All LTGs 08/09/2018)    Time  3    Period  Months    Status  On-going    Target Date  08/09/17      PT LONG TERM GOAL #2   Title  w/c to bed (mat at similar ht to his bed) squat pivot transfer modified independent.    Time  3    Period  Months    Status  Revised    Target Date  08/09/17      PT LONG TERM GOAL #3   Title  Patient and wife verbalize understanding of recommendations to manage spasms / hypertonic motions.     Time  3    Period  Months    Status  New    Target Date  08/09/17      PT LONG TERM GOAL #4   Title  Sit to/from stand chairs with armrests to RW with supervision.    Time  3    Period  Months    Status  On-going    Target Date  08/09/17      PT LONG TERM GOAL #5   Title  Patient standing balance with RW with BUE support staticly 2 minutes, scans environment and reaches within length of arm for objects with supervision.     Time  3    Period  Months    Status  On-going    Target Date  08/09/17      PT LONG TERM GOAL #6   Title  Patient ambulates 25' with RW with minA.     Time  3    Period  Months    Status  On-going    Target Date  08/09/17            Plan - 05/28/18 1108    Clinical Impression Statement  Treatment session focused on reinforcement of OT session and goal of decreasing use of w/c at home and increasing  use of RW with bed <> chair transfers and toilet transfers.  Pt tolerated well and was able to perform all transfers with minimal assistance but with extra time and cues for sequencing and safety.  Utilized bariatric rolling walker today, as this is what pt has at home.  Will continue to practice and reinforce.    Rehab Potential  Good    Clinical Impairments Affecting Rehab Potential  weakness & ROM impaired 4 extremities & trunk; dependency transfers, bed mobility, standing & gait    PT Frequency  2x / week    PT Duration  Other (comment)  13 weeks, 90 days   PT Treatment/Interventions  ADLs/Self Care Home Management;[place]ith Repostioning;Moist Heat;DME Instruction;Gait training;Stair training;Functional mobility training;Therapeutic activities;Therapeutic exercise;Balance training;Neuromuscular re-education;Patient/family education;Manual techniques;Passive range of motion;Vestibular    PT Next Visit Plan  USE BARIATRIC RW-THIS IS WHAT HE HAS AT HOME.  Continue to reinforce and practice use of RW for bed <> chair transfers, simulated bathroom transfers, gait.  continue with leg press machine    PT Home Exercise Plan  Access Code: [email or web address]     Consulted and Agree with Plan of Care  Patient;Family member/caregiver    Family Member Consulted  wife       Patient will benefit from skilled therapeutic intervention in order to improve the following deficits and impairments:  Abnormal gait, Cardiopulmonary status limiting activity, Decreased activity tolerance, Decreased balance, Decreased endurance, Decreased knowledge of use of DME, Decreased mobility, Decreased range of motion, Decreased strength, Difficulty walking, Dizziness, Impaired flexibility, Impaired UE functional use, Postural dysfunction  Visit Diagnosis: Abnormal posture  Unsteadiness on feet  Other abnormalities of gait and mobility     Problem List Patient Active Problem List   Diagnosis Date Noted  . Postural urinary  incontinence 05/20/2018  . 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 Myoclonic jerking 05/20/2018  . Myelopathy ([place]) 09/03/2017  . Acute respiratory failure with hypoxemia ([place]) 08/18/2017  . Hypokalemia 08/18/2017  . Leukocytosis 08/18/2017  . Muscle atrophy of lower extremity 08/09/2017  . Bowel and bladder incontinence 08/09/2017  . Right-sided muscle weakness 08/09/2017  . Neuropathy 08/09/2017  . 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/2017  . Abnormality of gait 07/12/2017  . Myoclonic jerkings, 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/2017  . Acquired right foot drop 07/12/2017  . UTI (urinary tract infection) due to Enterococcus 07/12/2017  . Pressure injury of skin 06/21/2017  . Acute cystitis 06/20/2017  . Generalized weakness 06/20/2017  . Dehydration 06/20/2017  . Dyspnea 12/26/2016  . Chronic cough 12/26/2016  . Hypersomnia with sleep apnea 10/30/2016  . CSA (central sleep apnea) 10/30/2016  . Wheezing symptom 10/30/2016  . CPAP/BiPAP dependent 07/01/2015  . 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 RLS (restless legs syndrome) 08/25/2014  . Primary gout 08/25/2014  . OSA on CPAP 08/25/2014  . Severe obesity (BMI >= 40) ([place]) 08/25/2014  . Obesity (BMI 30-39.9) 02/18/2013  . Sleep apnea with use of continuous positive airway pressure (CPAP)   . 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 S/P TKR (total knee replacement) 11/29/2012  . History of thyroid cancer    Rico Junker, PT, DPT 05/28/18    11:14 AM    [facility] [address] [place], [place], [address] Phone: [phone]   Fax:  [phone]  Name: [name] MRN: [id number] Date of Birth: 03-30-1941

## 2018-05-30 ENCOUNTER — Encounter: Payer: Self-pay | Admitting: Occupational Therapy

## 2018-05-30 ENCOUNTER — Ambulatory Visit: Payer: Medicare Other | Admitting: Physical Therapy

## 2018-05-30 ENCOUNTER — Telehealth: Payer: Self-pay | Admitting: Physical Therapy

## 2018-05-30 ENCOUNTER — Ambulatory Visit: Payer: Medicare Other | Admitting: Occupational Therapy

## 2018-05-30 DIAGNOSIS — R293 Abnormal posture: Secondary | ICD-10-CM

## 2018-05-30 DIAGNOSIS — R29898 Other symptoms and signs involving the musculoskeletal system: Secondary | ICD-10-CM

## 2018-05-30 DIAGNOSIS — M6281 Muscle weakness (generalized): Secondary | ICD-10-CM

## 2018-05-30 DIAGNOSIS — R2681 Unsteadiness on feet: Secondary | ICD-10-CM

## 2018-05-30 DIAGNOSIS — R29818 Other symptoms and signs involving the nervous system: Secondary | ICD-10-CM

## 2018-05-30 DIAGNOSIS — R2689 Other abnormalities of gait and mobility: Secondary | ICD-10-CM

## 2018-05-30 DIAGNOSIS — M25612 Stiffness of left shoulder, not elsewhere classified: Secondary | ICD-10-CM

## 2018-05-30 DIAGNOSIS — M48062 Spinal stenosis, lumbar region with neurogenic claudication: Secondary | ICD-10-CM

## 2018-05-30 DIAGNOSIS — Z9181 History of falling: Secondary | ICD-10-CM

## 2018-05-30 NOTE — Telephone Encounter (Signed)
Hello Dr. Brett [name], [name] is progressing with his ambulation and will be safe to ambulate with his wife and aide into and out of the bathroom with a RW soon.  Currently he has an wide bariatric RW with double (4) wheels in the front which is too wide for his bathroom.  Would you mind writing an order in Epic for a regular, two wheeled RW to allow him better access to his bathroom?  Thank you! [name], PT, DPT 05/30/18    1:38 PM

## 2018-05-30 NOTE — Therapy (Signed)
[facility] [address] [place], [place], [address] Phone: [phone]   Fax:  [phone]  Physical Therapy Treatment  Patient Details  Name: [name] MRN: [id number] Date of Birth: 29-May-1941 Referring Provider (PT): Larey Seat, MD   Encounter Date: 05/30/2018  PT End of Session - 05/30/18 2144    Visit Number  28    Number of Visits  48    Date for PT Re-Evaluation  08/09/18    Authorization Type  [employer] Pt has met $5900 oop, full coverage, 10th visit PN    PT Start Time  1400    PT Stop Time  1440    PT Time Calculation (min)  40 min    Activity Tolerance  Patient tolerated treatment well    Behavior During Therapy  [facility] for tasks assessed/performed       Past Medical History:  Diagnosis Date  . Arthritis   . Barrett esophagus   . Bronchitis   . CAD (coronary artery disease)    pt's wife denies any cardiac history on pt.  . Cancer ([place])    thyroid  . Carotid artery occlusion   . Constipation   . COPD (chronic obstructive pulmonary disease) ([place])   . Diabetes mellitus without complication ([place])   . Dyspnea   . GERD (gastroesophageal reflux disease)   . Headache    migraines  . History of thyroid cancer 2008  . History of thyroid cancer   . Hypertension   . OSA (obstructive sleep apnea)   . Pneumonia   . Restless leg   . S/P tendon repair    left thumb  . Sleep apnea with use of continuous positive airway pressure (CPAP)    [facility] , AHI  77cn central and obstrcutive. 16 cm water , 3 cm EPR.   . Thoracic ascending aortic aneurysm (HCC)    4.2 cm ascending TAA 08/18/17 CTA. Annual imaging recommended.  . Thyroid disease   . Ulcer    Peptic ulcer disease    Past Surgical History:  Procedure Laterality Date  . ANTERIOR CERVICAL DECOMP/DISCECTOMY FUSION N/A 09/03/2017   Procedure: ACDF - C3-C4 - C4-C5 - C5-C6;  Surgeon: Kary Kos, MD;  Location: [facility];  Service:  Neurosurgery;  Laterality: N/A;  . CAROTID ENDARTERECTOMY Left January 03, 2006   Dr. Amedeo Plenty  . COLONOSCOPY    . EYE SURGERY Bilateral    cataract surgery with lens implants  . game keepers thumb Right   . JOINT REPLACEMENT Left    knee X 2  . ROTATOR CUFF REPAIR Right   . THYROIDECTOMY  2008    There were no vitals filed for this visit.  Subjective Assessment - 05/30/18 1404    Subjective  Wife took current RW into the bathroom and the width and the wheels were to wide and he doesn't have room to turn in the bathroom and would have to walk backwards.  Will request order for regular two wheeled RW from physician.    Patient is accompained by:  Family member    Pertinent History  CAD, progressive muscular dystrophy, DM2, COPD, hypoxic respiratory failure, HTN,    Patient Stated Goals  To walk in home & community, wife wants him to be able to care for himself if something happens to her    Currently in Pain?  Yes  [facility] Adult PT Treatment/Exercise - 05/30/18 1417      Transfers   Transfers  Sit to Stand;Stand to [employer] Transfers    Sit to Stand  4: Min assist    Stand to Sit  4: Min guard    Stand Pivot Transfers  4: Min assist    Stand Pivot Transfer Details (indicate cue type and reason)  with RW and decreased cues from therapist required today for sequencing and decreased assistance for balance when pivoting      Ambulation/Gait   Stairs  Yes    Stairs Assistance  3: Mod assist    Stairs Assistance Details (indicate cue type and reason)  pt able to perform with one therapist today.  Ascended with LLE while therapist provided mild stabilization to R knee in stance.  Pt ascended with RLE once and required increased assistance to advance to next step.  Pt descended backwards with min A for foot placement on step    Stair Management Technique  Two rails;Step to pattern;Backwards;Forwards    Number of Stairs  4    Height of Stairs  6       Self-Care   Self-Care  --      Therapeutic Activites    Therapeutic Activities  ADL's    ADL's  Transitioned back to regular 2 wheeled walker as pt's bariatric RW will not fit in bathroom and pt will need to purchase a different RW for improved access.  Set up simulated bathroom and patient and wife performed full sequence of standing from transport w/c and "toilet" with min A and ambulating forwards, pivoting and stepping backwards to transfer from w/c <> toilet in small bathroom.  Pt performed with min A from wife.  Wife to have aide present with her the first few times they perform it at home.      Knee/Hip Exercises: Stretches   Passive Hamstring Stretch  Right;Left;5 reps;30 seconds    Passive Hamstring Stretch Limitations  seated at edge of w/c with knee extended while performing contract relax of quad mm             PT Education - 05/30/18 2143    Education Details  see TA; plan to start walking from waiting area to treatment area    Person(s) Educated  Patient;Spouse    Methods  Explanation;Demonstration    Comprehension  Verbalized understanding;Returned demonstration       PT Short Term Goals - 05/13/18 1800      PT SHORT TERM GOAL #1   Title  Patient & wife demonstrate & verbalize understanding of updated HEP. (All updated STGs Target Date: 06/11/2018)    Time  1    Period  Months    Status  On-going    Target Date  06/11/18      PT SHORT TERM GOAL #2   Title  Patient performs squat pivot transfers using simulated bed rail right & left between surfaces 2" difference with contact assist.    Time  1    Period  Months    Status  Revised    Target Date  06/11/18      PT SHORT TERM GOAL #3   Title  Patient & wife report understanding of bed positioning recommendations to minimize hypertonic LE issues.    Time  1    Period  Months    Status  New    Target Date  06/11/18      PT SHORT TERM GOAL #4  Title  Sit to/from stand w/c to RW with minA for 3 reps.      Time  1    Period  Months    Status  Revised    Target Date  06/11/18      PT SHORT TERM GOAL #5   Title  Patient ambulates 21' with RW with moderate assist.     Time  1    Status  On-going    Target Date  06/11/18        PT Long Term Goals - 05/15/18 1625      PT LONG TERM GOAL #1   Title  Patient & wife demonstrate & verbalize understanding of ongoing exercise / fitness plan including community programs. (All LTGs 08/09/2018)    Time  3    Period  Months    Status  On-going    Target Date  08/09/17      PT LONG TERM GOAL #2   Title  w/c to bed (mat at similar ht to his bed) squat pivot transfer modified independent.    Time  3    Period  Months    Status  Revised    Target Date  08/09/17      PT LONG TERM GOAL #3   Title  Patient and wife verbalize understanding of recommendations to manage spasms / hypertonic motions.     Time  3    Period  Months    Status  New    Target Date  08/09/17      PT LONG TERM GOAL #4   Title  Sit to/from stand chairs with armrests to RW with supervision.    Time  3    Period  Months    Status  On-going    Target Date  08/09/17      PT LONG TERM GOAL #5   Title  Patient standing balance with RW with BUE support staticly 2 minutes, scans environment and reaches within length of arm for objects with supervision.     Time  3    Period  Months    Status  On-going    Target Date  08/09/17      PT LONG TERM GOAL #6   Title  Patient ambulates 50' with RW with minA.     Time  3    Period  Months    Status  On-going    Target Date  08/09/17            Plan - 05/30/18 2144    Clinical Impression Statement  Continued to review bathroom ambulation and transfers but with wife performing with patient today in simulated bathroom.  Pt and wife performed safely with very minimal assistance.  Pt will not be able to perform at home until he receives standared 2 wheeled RW due to small space in bathroom.  Continued gait/stair negotiation  training and PROM to bilat hamstring mm with use of contract relax.  Pt very fatigued by end and unable to stand or ambulate to waiting area; will continue to progress towards LTG.    Rehab Potential  Good    Clinical Impairments Affecting Rehab Potential  weakness & ROM impaired 4 extremities & trunk; dependency transfers, bed mobility, standing & gait    PT Frequency  2x / week    PT Duration  Other (comment)   13 weeks, 90 days   PT Treatment/Interventions  ADLs/Self Care Home Management;Canalith Repostioning;Moist Heat;DME Instruction;Gait training;Stair training;Functional mobility training;Therapeutic  activities;Therapeutic exercise;Balance training;Neuromuscular re-education;Patient/family education;Manual techniques;Passive range of motion;Vestibular    PT Next Visit Plan  Take walker to waiting area: begin to work on walking from waiting area <>treatment area.  Did order for RW come in?  Leg press or Nustep for endurance/strengthening.  Stair Therapist, nutritional.  Standing with decreased UE support    PT Home Exercise Plan  Access Code: [email or web address]     Consulted and Agree with Plan of Care  Patient;Family member/caregiver    Family Member Consulted  wife       Patient will benefit from skilled therapeutic intervention in order to improve the following deficits and impairments:  Abnormal gait, Cardiopulmonary status limiting activity, Decreased activity tolerance, Decreased balance, Decreased endurance, Decreased knowledge of use of DME, Decreased mobility, Decreased range of motion, Decreased strength, Difficulty walking, Dizziness, Impaired flexibility, Impaired UE functional use, Postural dysfunction  Visit Diagnosis: Abnormal posture  Unsteadiness on feet  Other abnormalities of gait and mobility     Problem List Patient Active Problem List   Diagnosis Date Noted  . Postural urinary incontinence 05/20/2018  . 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 Myoclonic jerking 05/20/2018  . Myelopathy ([place]) 09/03/2017  . Acute respiratory failure with hypoxemia ([place]) 08/18/2017  . Hypokalemia 08/18/2017  . Leukocytosis 08/18/2017  . Muscle atrophy of lower extremity 08/09/2017  . Bowel and bladder incontinence 08/09/2017  . Right-sided muscle weakness 08/09/2017  . Neuropathy 08/09/2017  . 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/2017  . Abnormality of gait 07/12/2017  . Myoclonic jerkings, 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/2017  . Acquired right foot drop 07/12/2017  . UTI (urinary tract infection) due to Enterococcus 07/12/2017  . Pressure injury of skin 06/21/2017  . Acute cystitis 06/20/2017  . Generalized weakness 06/20/2017  . Dehydration 06/20/2017  . Dyspnea 12/26/2016  . Chronic cough 12/26/2016  . Hypersomnia with sleep apnea 10/30/2016  . CSA (central sleep apnea) 10/30/2016  . Wheezing symptom 10/30/2016  . CPAP/BiPAP dependent 07/01/2015  . 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 RLS (restless legs syndrome) 08/25/2014  . Primary gout 08/25/2014  . OSA on CPAP 08/25/2014  . Severe obesity (BMI >= 40) ([place]) 08/25/2014  . Obesity (BMI 30-39.9) 02/18/2013  . Sleep apnea with use of continuous positive airway pressure (CPAP)   . 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 S/P TKR (total knee replacement) 11/29/2012  . History of thyroid cancer     Rico Junker, PT, DPT 05/30/18    9:52 PM    [facility] [address] [place], [place], [address] Phone: [phone]   Fax:  [phone]  Name: ELLARD NAN MRN: [id number] Date of Birth: Mar 15, 1941

## 2018-05-30 NOTE — Therapy (Signed)
[facility] [address] [place], [place], [address] Phone: [phone]   Fax:  [phone]  Occupational Therapy Treatment  Patient Details  Name: [name] MRN: [id number] Date of Birth: 04/29/1941 No data recorded  Encounter Date: 05/30/2018  OT End of Session - 05/30/18 1627    Visit Number  14    Number of Visits  27    Date for OT Re-Evaluation  07/08/18    Authorization Type  blue medicare will need PN every 10th visit    Authorization Time Period  90    Authorization - Visit Number  14    Authorization - Number of Visits  20    OT Start Time  1316    OT Stop Time  1359    OT Time Calculation (min)  43 min    Activity Tolerance  Patient tolerated treatment well       Past Medical History:  Diagnosis Date  . Arthritis   . Barrett esophagus   . Bronchitis   . CAD (coronary artery disease)    pt's wife denies any cardiac history on pt.  . Cancer ([place])    thyroid  . Carotid artery occlusion   . Constipation   . COPD (chronic obstructive pulmonary disease) ([place])   . Diabetes mellitus without complication ([place])   . Dyspnea   . GERD (gastroesophageal reflux disease)   . Headache    migraines  . History of thyroid cancer 2008  . History of thyroid cancer   . Hypertension   . OSA (obstructive sleep apnea)   . Pneumonia   . Restless leg   . S/P tendon repair    left thumb  . Sleep apnea with use of continuous positive airway pressure (CPAP)    [facility] , AHI  77cn central and obstrcutive. 16 cm water , 3 cm EPR.   . Thoracic ascending aortic aneurysm (HCC)    4.2 cm ascending TAA 08/18/17 CTA. Annual imaging recommended.  . Thyroid disease   . Ulcer    Peptic ulcer disease    Past Surgical History:  Procedure Laterality Date  . ANTERIOR CERVICAL DECOMP/DISCECTOMY FUSION N/A 09/03/2017   Procedure: ACDF - C3-C4 - C4-C5 - C5-C6;  Surgeon: Kary Kos, MD;  Location: [facility];  Service:  Neurosurgery;  Laterality: N/A;  . CAROTID ENDARTERECTOMY Left January 03, 2006   Dr. Amedeo Plenty  . COLONOSCOPY    . EYE SURGERY Bilateral    cataract surgery with lens implants  . game keepers thumb Right   . JOINT REPLACEMENT Left    knee X 2  . ROTATOR CUFF REPAIR Right   . THYROIDECTOMY  2008    There were no vitals filed for this visit.  Subjective Assessment - 05/30/18 1322    Subjective   I had some pain in my L shoulder but I took some excedrin and the pain is gone.     Patient is accompained by:  Family member   wife   Pertinent History  ACDF C3- C6 09/03/2017. Pt began with LE weakness 05/2017.   Pt has been wheelchair bound starting in 06/2017. Pt in SNF for a month, then went home.  Pt then had ACDF C3- C6 09/03/2017 pt then d/c to SNF until 11/29/2017 for PT, OT and ST.  Pt discharged home with HHPT and OT which ended right before pt started with PT here in this clinic.  PMH:    Patient  Stated Goals  get my arms working better and move better    Currently in Pain?  No/denies                   OT Treatments/Exercises (OP) - 05/30/18 0001      Neurological Re-education Exercises   Other Exercises 2  Neuro re ed to address sit to stand, static and dynamic standing balance and stand step transfers. Pt able to complete all with vc's and close supervision. Pt also demonstrating controlled stand to sit.  Also addressed completing activity in standing with only 1 UE support (light on walker) - pt required several rest breaks but was able to maintain balance. Pt needs frequent cues for postural alignment in standing.  Also addressed bilateral mid reach in sitting in closed chain activity with min facilitation. as well as open chain unilateral mid reach with LUE for functional task.                OT Short Term Goals - 05/30/18 1626      OT SHORT TERM GOAL #1   Title  Pt and wife will be mod I with UE home activities program focused on UE strenthening, coordination and  UE functional use (BUE's) - 04/23/2018    Status  Achieved      OT SHORT TERM GOAL #2   Title  Pt will demonstrate ability for active L shoulder flexion in supine to 125* without pain in prep for overhead functional reach.     Status  Achieved      OT SHORT TERM GOAL #3   Title  Pt will demonstrate ability to pick up light weight object with LUE at mid reach (75* of shoulder flexion).      Status  Achieved      OT SHORT TERM GOAL #4   Title  Pt will demonstrate ability to use reacher to don pants over feet in sitting with min a.    Status  Achieved      OT SHORT TERM GOAL #5   Title  Patient will demonstrate improved coordination as evidenced by reduced time on 9 hole peg test by 10 seconds each hand - to improve ability to manipulate small objects - pills, buttons, etc.      Baseline  right 1 min 19 sec (10/16)   left 58 sec (10/16)    Status  Achieved      OT SHORT TERM GOAL #6   Title  Pt will demonstate improved grip strength by at least 5 pounds total to assist with ADL tasks. (baseline L= 45, R =38)    Status  Not Met        OT Long Term Goals - 05/30/18 1626      OT LONG TERM GOAL #1   Title  Pt and wife will be mod I with upgraded home activities program - 05/21/2018    Status  On-going      OT LONG TERM GOAL #2   Title  Pt will demonstrate ability to comb hair mod I    Status  Achieved      OT LONG TERM GOAL #3   Title  Pt will be min a for simple hot familiar meal prep    Status  On-going      OT LONG TERM GOAL #4   Title  Pt will be mod I using reacher to don pants over feet.     Status  Achieved  OT LONG TERM GOAL #5   Title  Pt will demonstrate improved grip strength by at least 7 pounds total to assist with simple cooking tasks (baseline L= 45, R = 38).    Status  Achieved   05/27/2018  R= 45, L=55     OT LONG TERM GOAL #6   Title  Pt will verbalize understanding of adapted techniques for buttoning and zipping prn    Status  Achieved      OT LONG  TERM GOAL #7   Title  Pt will be able to lift light weight object at mid reach during simple cooking tasks with LUE    Status  Achieved      OT LONG TERM GOAL #8   Title  Pt will be able to lift 2 pound object bilaterally from mid reach to assist with functional activities at home.     Status  On-going            Plan - 05/30/18 1627    Clinical Impression Statement  Pt making good progress toward goals.  Pt with improved motivation during session.     Occupational Profile and client history currently impacting functional performance  husband, friend, reitiree.  PMH:  CAD, DM, COPD, hypoxic respiratory failure, HTN    Occupational performance deficits (Please refer to evaluation for details):  ADL's;IADL's;Leisure;Social Participation;Rest and Sleep    Rehab Potential  Fair    Current Impairments/barriers affecting progress:  length of time since onset, severity of deficits    OT Frequency  2x / week    OT Duration  8 weeks    OT Treatment/Interventions  Self-care/ADL training;Moist Heat;Therapeutic exercise;Neuromuscular education;DME and/or AE instruction;Passive range of motion;Manual Therapy;Therapist, nutritional;Therapeutic activities;Patient/family education;Balance training    Plan  NMR sit to stand supine to sit, standing weight shift, decreased relaince on UE's, mid functional reach, check upgraded HEP for UE's, cooking     Consulted and Agree with Plan of Care  Patient;Family member/caregiver    Family Member Consulted  wife       Patient will benefit from skilled therapeutic intervention in order to improve the following deficits and impairments:  Decreased activity tolerance, Decreased balance, Decreased coordination, Decreased range of motion, Decreased mobility, Decreased knowledge of use of DME, Decreased strength, Difficulty walking, Impaired UE functional use, Impaired sensation, Impaired flexibility, Pain  Visit Diagnosis: Abnormal posture  Unsteadiness on  feet  Muscle weakness (generalized)  Other symptoms and signs involving the nervous system  Other symptoms and signs involving the musculoskeletal system  Stiffness of left shoulder, not elsewhere classified    Problem List Patient Active Problem List   Diagnosis Date Noted  . Postural urinary incontinence 05/20/2018  . 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 Myoclonic jerking 05/20/2018  . Myelopathy ([place]) 09/03/2017  . Acute respiratory failure with hypoxemia ([place]) 08/18/2017  . Hypokalemia 08/18/2017  . Leukocytosis 08/18/2017  . Muscle atrophy of lower extremity 08/09/2017  . Bowel and bladder incontinence 08/09/2017  . Right-sided muscle weakness 08/09/2017  . Neuropathy 08/09/2017  . 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/2017  . Abnormality of gait 07/12/2017  . Myoclonic jerkings, 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/2017  . Acquired right foot drop 07/12/2017  . UTI (urinary tract infection) due to Enterococcus 07/12/2017  . Pressure injury of skin 06/21/2017  . Acute cystitis 06/20/2017  . Generalized weakness 06/20/2017  . Dehydration 06/20/2017  . Dyspnea 12/26/2016  . Chronic cough 12/26/2016  .  Hypersomnia with sleep apnea 10/30/2016  . CSA (central sleep apnea) 10/30/2016  . Wheezing symptom 10/30/2016  . CPAP/BiPAP dependent 07/01/2015  . 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 RLS (restless legs syndrome) 08/25/2014  . Primary gout 08/25/2014  . OSA on CPAP 08/25/2014  . Severe obesity (BMI >= 40) ([place]) 08/25/2014  . Obesity (BMI 30-39.9) 02/18/2013  . Sleep apnea with use of continuous positive airway pressure (CPAP)   . 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 S/P TKR (total knee replacement) 11/29/2012  . History of thyroid cancer     Quay Burow, OTR/L 05/30/2018, 4:29 PM  [facility] [address] [place], [place], [address] Phone: [phone]   Fax:  [phone]  Name: [name] MRN: [id number] Date of Birth: 1940-09-26

## 2018-05-31 NOTE — Telephone Encounter (Signed)
With pleasure !   Thank you for using the phone note to address this, I am much more often looking at this tap ( in comp. To messages ) .  Forward to my RN, Gerline Legacy, who works wonders!  Myriam Jacobson : general DME order or special communication?     Happy Holiday, Larey Seat, MD

## 2018-05-31 NOTE — Telephone Encounter (Signed)
Dr Dohmeier placed the order will send the order to [facility] to assist with getting the patient set up with equipment.

## 2018-06-03 ENCOUNTER — Ambulatory Visit: Payer: Medicare Other | Admitting: Physical Therapy

## 2018-06-03 ENCOUNTER — Encounter: Payer: Self-pay | Admitting: Physical Therapy

## 2018-06-03 DIAGNOSIS — R293 Abnormal posture: Secondary | ICD-10-CM | POA: Diagnosis not present

## 2018-06-03 DIAGNOSIS — R2681 Unsteadiness on feet: Secondary | ICD-10-CM

## 2018-06-03 DIAGNOSIS — R2689 Other abnormalities of gait and mobility: Secondary | ICD-10-CM

## 2018-06-04 NOTE — Therapy (Signed)
[facility] [address] [place], [place], [address] Phone: [phone]   Fax:  [phone]  Physical Therapy Treatment  Patient Details  Name: [name] MRN: [id number] Date of Birth: 1940-10-29 Referring Provider (PT): Larey Seat, MD   Encounter Date: 06/03/2018  PT End of Session - 06/04/18 1153    Visit Number  29    Number of Visits  44    Date for PT Re-Evaluation  08/09/18    Authorization Type  [employer] Pt has met $5900 oop, full coverage, 10th visit PN    PT Start Time  1317    PT Stop Time  1400    PT Time Calculation (min)  43 min    Activity Tolerance  Patient tolerated treatment well    Behavior During Therapy  [facility] for tasks assessed/performed       Past Medical History:  Diagnosis Date  . Arthritis   . Barrett esophagus   . Bronchitis   . CAD (coronary artery disease)    pt's wife denies any cardiac history on pt.  . Cancer ([place])    thyroid  . Carotid artery occlusion   . Constipation   . COPD (chronic obstructive pulmonary disease) ([place])   . Diabetes mellitus without complication ([place])   . Dyspnea   . GERD (gastroesophageal reflux disease)   . Headache    migraines  . History of thyroid cancer 2008  . History of thyroid cancer   . Hypertension   . OSA (obstructive sleep apnea)   . Pneumonia   . Restless leg   . S/P tendon repair    left thumb  . Sleep apnea with use of continuous positive airway pressure (CPAP)    [facility] , AHI  77cn central and obstrcutive. 16 cm water , 3 cm EPR.   . Thoracic ascending aortic aneurysm (HCC)    4.2 cm ascending TAA 08/18/17 CTA. Annual imaging recommended.  . Thyroid disease   . Ulcer    Peptic ulcer disease    Past Surgical History:  Procedure Laterality Date  . ANTERIOR CERVICAL DECOMP/DISCECTOMY FUSION N/A 09/03/2017   Procedure: ACDF - C3-C4 - C4-C5 - C5-C6;  Surgeon: Kary Kos, MD;  Location: [facility];  Service:  Neurosurgery;  Laterality: N/A;  . CAROTID ENDARTERECTOMY Left January 03, 2006   Dr. Amedeo Plenty  . COLONOSCOPY    . EYE SURGERY Bilateral    cataract surgery with lens implants  . game keepers thumb Right   . JOINT REPLACEMENT Left    knee X 2  . ROTATOR CUFF REPAIR Right   . THYROIDECTOMY  2008    There were no vitals filed for this visit.  Subjective Assessment - 06/03/18 1326    Subjective  Had a hard time with legs this weekend; less able to put weight on them.  Provided wife with order for RW to pick up so pt can begin to ambulate in and out of bathroom.  Pt did make pound cake over the weekend.    Patient is accompained by:  Family member    Pertinent History  CAD, DM2, COPD, hypoxic respiratory failure, HTN,    Patient Stated Goals  To walk in home & community, wife wants him to be able to care for himself if something happens to her    Currently in Pain?  Yes    Pain Score  5     Pain Location  Shoulder  Pain Orientation  Left    Pain Descriptors / Indicators  Sore;Discomfort    Pain Type  Chronic pain                       [facility] Adult PT Treatment/Exercise - 06/03/18 1354      Transfers   Transfers  Sit to Stand;Stand to Sit;Stand Pivot Transfers    Sit to Stand  3: Mod assist    Sit to Stand Details (indicate cue type and reason)  increased assistance required today to maintain forward weight shift while initiating hip and knee extension to stand; increased flexor tone noted in RLE today    Stand to Sit  4: Min assist    Stand Pivot Transfers  4: Min assist    Stand Pivot Transfer Details (indicate cue type and reason)  Performed w/c <> Nustep.  Once standing pt required assistance from therapist to weight shift fully onto RLE to pivot LLE and assistance to fully pivot/advance RLE.  Therapist also provided intermittent assistance to stabilize R knee in stance      Ambulation/Gait   Ambulation/Gait  Yes    Ambulation/Gait Assistance  3: Mod assist     Ambulation/Gait Assistance Details  support at R knee and cues to shift weight forwards and to the R during R stance to increase hip and knee extension on RLE    Ambulation Distance (Feet)  10 Feet   2, 5, 10    Assistive device  Rolling walker    Gait Pattern  Step-to pattern;Decreased step length - right;Decreased stance time - right;Decreased stride length;Decreased hip/knee flexion - right;Decreased dorsiflexion - right;Decreased weight shift to right;Right flexed knee in stance;Left flexed knee in stance;Shuffle;Poor foot clearance - left;Poor foot clearance - right;Trunk flexed      Knee/Hip Exercises: Aerobic   Nustep  L6 x 3 min decreasing to level 5 x 3 minutes with bilat UE and LE; 2 minutes with LE only at level 5 resistance             PT Education - 06/04/18 1152    Education Details  provided wife with order for RW to take to DME provider; discussed taking anti-spasmodic as it was prescribed by neurology for maximum benefit while minimizing side effects    Person(s) Educated  Patient;Spouse    Methods  Explanation    Comprehension  Verbalized understanding       PT Short Term Goals - 05/13/18 1800      PT SHORT TERM GOAL #1   Title  Patient & wife demonstrate & verbalize understanding of updated HEP. (All updated STGs Target Date: 06/11/2018)    Time  1    Period  Months    Status  On-going    Target Date  06/11/18      PT SHORT TERM GOAL #2   Title  Patient performs squat pivot transfers using simulated bed rail right & left between surfaces 2" difference with contact assist.    Time  1    Period  Months    Status  Revised    Target Date  06/11/18      PT SHORT TERM GOAL #3   Title  Patient & wife report understanding of bed positioning recommendations to minimize hypertonic LE issues.    Time  1    Period  Months    Status  New    Target Date  06/11/18      PT SHORT  TERM GOAL #4   Title  Sit to/from stand w/c to RW with minA for 3 reps.     Time  1     Period  Months    Status  Revised    Target Date  06/11/18      PT SHORT TERM GOAL #5   Title  Patient ambulates 13' with RW with moderate assist.     Time  1    Status  On-going    Target Date  06/11/18        PT Long Term Goals - 05/15/18 1625      PT LONG TERM GOAL #1   Title  Patient & wife demonstrate & verbalize understanding of ongoing exercise / fitness plan including community programs. (All LTGs 08/09/2018)    Time  3    Period  Months    Status  On-going    Target Date  08/09/17      PT LONG TERM GOAL #2   Title  w/c to bed (mat at similar ht to his bed) squat pivot transfer modified independent.    Time  3    Period  Months    Status  Revised    Target Date  08/09/17      PT LONG TERM GOAL #3   Title  Patient and wife verbalize understanding of recommendations to manage spasms / hypertonic motions.     Time  3    Period  Months    Status  New    Target Date  08/09/17      PT LONG TERM GOAL #4   Title  Sit to/from stand chairs with armrests to RW with supervision.    Time  3    Period  Months    Status  On-going    Target Date  08/09/17      PT LONG TERM GOAL #5   Title  Patient standing balance with RW with BUE support staticly 2 minutes, scans environment and reaches within length of arm for objects with supervision.     Time  3    Period  Months    Status  On-going    Target Date  08/09/17      PT LONG TERM GOAL #6   Title  Patient ambulates 83' with RW with minA.     Time  3    Period  Months    Status  On-going    Target Date  08/09/17            Plan - 06/04/18 1154    Clinical Impression Statement  Unable to ambulate from waiting area as pt is experiencing increased flexor tone today.  Required 3 reps of shorter ambulation distances with increased assistance from therapist for sit > stand and ambulation.  Pt did demonstrate improved endurance and activation on Nustep tolerating greater resistance for greater amount of time.   Following Nustep pt demonstrated improved activation of LE extensors for stand pivot back to w/c.  Will continue to progress as pt is able to tolerate.  Pt and wife to pick up RW this afternoon and will begin using in bathroom as able at home.      Rehab Potential  Good    Clinical Impairments Affecting Rehab Potential  weakness & ROM impaired 4 extremities & trunk; dependency transfers, bed mobility, standing & gait    PT Frequency  2x / week    PT Duration  Other (comment)   13 weeks,  90 days   PT Treatment/Interventions  ADLs/Self Care Home Management;Canalith Repostioning;Moist Heat;DME Instruction;Gait training;Stair training;Functional mobility training;Therapeutic activities;Therapeutic exercise;Balance training;Neuromuscular re-education;Patient/family education;Manual techniques;Passive range of motion;Vestibular    PT Next Visit Plan  Start checking STG.  Did they get RW from [facility]?  Have they used it in the bathroom yet?  Take walker to waiting area: begin to work on walking from waiting area <>treatment area.  Leg press or Nustep for endurance/strengthening.  Stair Therapist, nutritional.  Standing with decreased UE support    PT Home Exercise Plan  Access Code: [email or web address]     Consulted and Agree with Plan of Care  Patient;Family member/caregiver    Family Member Consulted  wife       Patient will benefit from skilled therapeutic intervention in order to improve the following deficits and impairments:  Abnormal gait, Cardiopulmonary status limiting activity, Decreased activity tolerance, Decreased balance, Decreased endurance, Decreased knowledge of use of DME, Decreased mobility, Decreased range of motion, Decreased strength, Difficulty walking, Dizziness, Impaired flexibility, Impaired UE functional use, Postural dysfunction  Visit Diagnosis: Abnormal posture  Unsteadiness on feet  Other abnormalities of gait and mobility     Problem List Patient Active Problem List    Diagnosis Date Noted  . Postural urinary incontinence 05/20/2018  . 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 Myoclonic jerking 05/20/2018  . Myelopathy ([place]) 09/03/2017  . Acute respiratory failure with hypoxemia ([place]) 08/18/2017  . Hypokalemia 08/18/2017  . Leukocytosis 08/18/2017  . Muscle atrophy of lower extremity 08/09/2017  . Bowel and bladder incontinence 08/09/2017  . Right-sided muscle weakness 08/09/2017  . Neuropathy 08/09/2017  . 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/2017  . Abnormality of gait 07/12/2017  . Myoclonic jerkings, 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/2017  . Acquired right foot drop 07/12/2017  . UTI (urinary tract infection) due to Enterococcus 07/12/2017  . Pressure injury of skin 06/21/2017  . Acute cystitis 06/20/2017  . Generalized weakness 06/20/2017  . Dehydration 06/20/2017  . Dyspnea 12/26/2016  . Chronic cough 12/26/2016  . Hypersomnia with sleep apnea 10/30/2016  . CSA (central sleep apnea) 10/30/2016  . Wheezing symptom 10/30/2016  . CPAP/BiPAP dependent 07/01/2015  . 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 RLS (restless legs syndrome) 08/25/2014  . Primary gout 08/25/2014  . OSA on CPAP 08/25/2014  . Severe obesity (BMI >= 40) ([place]) 08/25/2014  . Obesity (BMI 30-39.9) 02/18/2013  . Sleep apnea with use of continuous positive airway pressure (CPAP)   . 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 S/P TKR (total knee replacement) 11/29/2012  . History of thyroid cancer     Rico Junker, PT, DPT 06/04/18    12:02 PM    [facility] [address] [place], [place], [address] Phone: [phone]   Fax:  [phone]  Name: FAIZ WEBER MRN: [id number] Date of Birth: December 21, 1940

## 2018-06-05 ENCOUNTER — Ambulatory Visit: Payer: Medicare Other | Admitting: Occupational Therapy

## 2018-06-05 ENCOUNTER — Ambulatory Visit: Payer: Medicare Other | Admitting: Physical Therapy

## 2018-06-05 ENCOUNTER — Encounter: Payer: Self-pay | Admitting: Physical Therapy

## 2018-06-05 DIAGNOSIS — R2689 Other abnormalities of gait and mobility: Secondary | ICD-10-CM

## 2018-06-05 DIAGNOSIS — R2681 Unsteadiness on feet: Secondary | ICD-10-CM

## 2018-06-05 DIAGNOSIS — R293 Abnormal posture: Secondary | ICD-10-CM

## 2018-06-05 NOTE — Therapy (Signed)
[facility] [address] [place], [place], [address] Phone: [phone]   Fax:  [phone]  Physical Therapy Treatment and 10th visit PN  Patient Details  Name: [name] MRN: [id number] Date of Birth: 07-08-1940 Referring Provider (PT): Larey Seat, MD   Encounter Date: 06/05/2018   10th Visit Physical Therapy Progress Note  Dates of Reporting Period: 04/29/2018 to 06/05/2018   PT End of Session - 06/05/18 1640    Visit Number  30    Number of Visits  48    Date for PT Re-Evaluation  08/09/18    Authorization Type  [employer] Pt has met $5900 oop, full coverage, 10th visit PN    PT Start Time  1445    PT Stop Time  1530    PT Time Calculation (min)  45 min    Activity Tolerance  Patient tolerated treatment well    Behavior During Therapy  [facility] for tasks assessed/performed       Past Medical History:  Diagnosis Date  . Arthritis   . Barrett esophagus   . Bronchitis   . CAD (coronary artery disease)    pt's wife denies any cardiac history on pt.  . Cancer ([place])    thyroid  . Carotid artery occlusion   . Constipation   . COPD (chronic obstructive pulmonary disease) ([place])   . Diabetes mellitus without complication ([place])   . Dyspnea   . GERD (gastroesophageal reflux disease)   . Headache    migraines  . History of thyroid cancer 2008  . History of thyroid cancer   . Hypertension   . OSA (obstructive sleep apnea)   . Pneumonia   . Restless leg   . S/P tendon repair    left thumb  . Sleep apnea with use of continuous positive airway pressure (CPAP)    [facility] , AHI  77cn central and obstrcutive. 16 cm water , 3 cm EPR.   . Thoracic ascending aortic aneurysm (HCC)    4.2 cm ascending TAA 08/18/17 CTA. Annual imaging recommended.  . Thyroid disease   . Ulcer    Peptic ulcer disease    Past Surgical History:  Procedure Laterality Date  . ANTERIOR CERVICAL DECOMP/DISCECTOMY  FUSION N/A 09/03/2017   Procedure: ACDF - C3-C4 - C4-C5 - C5-C6;  Surgeon: Kary Kos, MD;  Location: [facility];  Service: Neurosurgery;  Laterality: N/A;  . CAROTID ENDARTERECTOMY Left January 03, 2006   Dr. Amedeo Plenty  . COLONOSCOPY    . EYE SURGERY Bilateral    cataract surgery with lens implants  . game keepers thumb Right   . JOINT REPLACEMENT Left    knee X 2  . ROTATOR CUFF REPAIR Right   . THYROIDECTOMY  2008    There were no vitals filed for this visit.  Subjective Assessment - 06/05/18 1502    Subjective  Got his RW from [facility] and walked into the bathroom with his wife this morning.  Floor is very slippery and RW wants to slide out from in front of him.  Has started taking afternoon dose of Baclofen again as previously prescribed.    Patient is accompained by:  Family member    Pertinent History  CAD, DM2, COPD, hypoxic respiratory failure, HTN,    Patient Stated Goals  To walk in home & community, wife wants him to be able to care for himself if something happens to her    Currently in Pain?  Yes                       [facility] Adult PT Treatment/Exercise - 06/05/18 1637      Transfers   Transfers  Sit to Stand;Stand to [employer] Transfers    Sit to Stand  5: Supervision;4: Min guard    Sit to Stand Details (indicate cue type and reason)  assistance to stabilize RW only today    Stand to Sit  4: Min assist    Stand to Sit Details  for controlled descent to chair    Stand Pivot Transfers  5: Supervision;4: Min guard    Stand Pivot Transfer Details (indicate cue type and reason)  with RW w/c <> mat      Ambulation/Gait   Ambulation/Gait  Yes    Ambulation/Gait Assistance  4: Min assist    Ambulation/Gait Assistance Details  only required verbal cues for LE extension activation in stance and increased step length/foot clearance bilaterally during swing phase    Ambulation Distance (Feet)  25 Feet   x2   Assistive device  Rolling walker    Gait Pattern   Step-to pattern;Step-through pattern;Decreased step length - right;Decreased step length - left;Decreased stride length;Decreased hip/knee flexion - right;Decreased hip/knee flexion - left;Decreased dorsiflexion - left;Decreased dorsiflexion - right;Left foot flat;Right foot flat;Right flexed knee in stance;Left flexed knee in stance;Shuffle;Trunk flexed;Poor foot clearance - right;Poor foot clearance - left    Ambulation Surface  Level;Indoor    Gait Comments  Also performed side stepping with RW to L and R x 10 steps each with verbal cues for sequence, min A             PT Education - 06/05/18 1639    Education Details  advised to not perform bathroom ambulation with RW without aide due to slick floor    Person(s) Educated  Patient;Spouse    Methods  Explanation    Comprehension  Verbalized understanding       PT Short Term Goals - 06/05/18 1504      PT SHORT TERM GOAL #1   Title  Patient & wife demonstrate & verbalize understanding of updated HEP. (All updated STGs Target Date: 06/11/2018)    Time  1    Period  Months    Status  Achieved      PT SHORT TERM GOAL #2   Title  Patient performs squat pivot transfers using simulated bed rail right & left between surfaces 2" difference with contact assist.    Baseline  stand pivot with RW supervision-min A    Time  1    Period  Months    Status  Achieved      PT SHORT TERM GOAL #3   Title  Patient & wife report understanding of bed positioning recommendations to minimize hypertonic LE issues.    Time  1    Period  Months    Status  Achieved      PT SHORT TERM GOAL #4   Title  Sit to/from stand w/c to RW with minA for 3 reps.     Time  1    Period  Months    Status  Achieved      PT SHORT TERM GOAL #5   Title  Patient ambulates 39' with RW with moderate assist.     Time  1    Status  Achieved        PT Long Term Goals -  06/05/18 1643      PT LONG TERM GOAL #1   Title  Patient & wife demonstrate & verbalize  understanding of ongoing exercise / fitness plan including community programs. (All LTGs 08/09/2018)    Time  3    Period  Months    Status  On-going      PT LONG TERM GOAL #2   Title  Consistent stand pivot transfer w/c < > bed/mat with RW and supervision    Time  3    Period  Months    Status  Revised      PT LONG TERM GOAL #3   Title  Patient and wife verbalize understanding of recommendations to manage spasms / hypertonic motions.     Time  3    Period  Months    Status  New      PT LONG TERM GOAL #4   Title  Sit to/from stand chairs with armrests to RW with supervision.    Time  3    Period  Months    Status  On-going      PT LONG TERM GOAL #5   Title  Patient standing balance with RW with BUE support staticly 2 minutes, scans environment and reaches within length of arm for objects with supervision.     Time  3    Period  Months    Status  On-going      PT LONG TERM GOAL #6   Title  Patient ambulates 18' with RW with minA.     Time  3    Period  Months    Status  On-going            Plan - 06/05/18 1641    Clinical Impression Statement  Treatment session focused on progress towards STG.  Pt is making good progress and has met all STG today.  He is demonstrating improved strength in UE and LE requiring only supervision-min A with extra time to perform sit > stand from w/c or mat x 4 reps, supervision-min A for stand pivot with RW, min A for gait x 25' with improved ability to clear each foot and take full step length and has began to ambulate into the bathroom with RW if wife and aide are present due to slick tile and requiring one person to stabilize RW.  Pt is also performing consistent stand pivot transfers with wife.  Will continue to progress towards LTG.    Rehab Potential  Good    Clinical Impairments Affecting Rehab Potential  weakness & ROM impaired 4 extremities & trunk; dependency transfers, bed mobility, standing & gait    PT Frequency  2x / week    PT  Duration  Other (comment)   13 weeks, 90 days   PT Treatment/Interventions  ADLs/Self Care Home Management;Canalith Repostioning;Moist Heat;DME Instruction;Gait training;Stair training;Functional mobility training;Therapeutic activities;Therapeutic exercise;Balance training;Neuromuscular re-education;Patient/family education;Manual techniques;Passive range of motion;Vestibular    PT Next Visit Plan   Take walker to waiting area: begin to work on walking from waiting area <>treatment area.  Leg press or Nustep for endurance/strengthening.  Stair Therapist, nutritional.  Standing with decreased UE support    PT Home Exercise Plan  Access Code: [email or web address]     Consulted and Agree with Plan of Care  Patient;Family member/caregiver    Family Member Consulted  wife       Patient will benefit from skilled therapeutic intervention in order to improve the following deficits and impairments:  Abnormal  gait, Cardiopulmonary status limiting activity, Decreased activity tolerance, Decreased balance, Decreased endurance, Decreased knowledge of use of DME, Decreased mobility, Decreased range of motion, Decreased strength, Difficulty walking, Dizziness, Impaired flexibility, Impaired UE functional use, Postural dysfunction  Visit Diagnosis: Abnormal posture  Unsteadiness on feet  Other abnormalities of gait and mobility     Problem List Patient Active Problem List   Diagnosis Date Noted  . Postural urinary incontinence 05/20/2018  . 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 Myoclonic jerking 05/20/2018  . Myelopathy ([place]) 09/03/2017  . Acute respiratory failure with hypoxemia ([place]) 08/18/2017  . Hypokalemia 08/18/2017  . Leukocytosis 08/18/2017  . Muscle atrophy of lower extremity 08/09/2017  . Bowel and bladder incontinence 08/09/2017  . Right-sided muscle weakness 08/09/2017  . Neuropathy 08/09/2017  . Neurogenic claudication due to lumbar spinal  stenosis 08/09/2017  . Abnormality of gait 07/12/2017  . Myoclonic jerkings, 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/2017  . Acquired right foot drop 07/12/2017  . UTI (urinary tract infection) due to Enterococcus 07/12/2017  . Pressure injury of skin 06/21/2017  . Acute cystitis 06/20/2017  . Generalized weakness 06/20/2017  . Dehydration 06/20/2017  . Dyspnea 12/26/2016  . Chronic cough 12/26/2016  . Hypersomnia with sleep apnea 10/30/2016  . CSA (central sleep apnea) 10/30/2016  . Wheezing symptom 10/30/2016  . CPAP/BiPAP dependent 07/01/2015  . 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 RLS (restless legs syndrome) 08/25/2014  . Primary gout 08/25/2014  . OSA on CPAP 08/25/2014  . Severe obesity (BMI >= 40) ([place]) 08/25/2014  . Obesity (BMI 30-39.9) 02/18/2013  . Sleep apnea with use of continuous positive airway pressure (CPAP)   . 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 S/P TKR (total knee replacement) 11/29/2012  . History of thyroid cancer    Rico Junker, PT, DPT 06/05/18    4:46 PM    [facility] [address] [place], [place], [address] Phone: [phone]   Fax:  [phone]  Name: [name] MRN: [id number] Date of Birth: 1940/12/31

## 2018-06-13 ENCOUNTER — Encounter: Payer: Self-pay | Admitting: Physical Therapy

## 2018-06-13 ENCOUNTER — Ambulatory Visit: Payer: Medicare Other | Admitting: Physical Therapy

## 2018-06-13 DIAGNOSIS — R293 Abnormal posture: Secondary | ICD-10-CM

## 2018-06-13 DIAGNOSIS — Z9181 History of falling: Secondary | ICD-10-CM

## 2018-06-13 DIAGNOSIS — R2689 Other abnormalities of gait and mobility: Secondary | ICD-10-CM

## 2018-06-13 DIAGNOSIS — R2681 Unsteadiness on feet: Secondary | ICD-10-CM

## 2018-06-13 DIAGNOSIS — M6281 Muscle weakness (generalized): Secondary | ICD-10-CM

## 2018-06-13 NOTE — Therapy (Signed)
[facility] [address] [employer] [place], [place], [address] Phone: [phone]   Fax:  [phone]  Physical Therapy Treatment  Patient Details  Name: [name] MRN: [id number] Date of Birth: September 02, 1940 Referring Provider (PT): Larey Seat, MD   Encounter Date: 06/13/2018  PT End of Session - 06/13/18 1216    Visit Number  31    Number of Visits  56    Date for PT Re-Evaluation  08/09/18    Authorization Type  [employer] Pt has met $5900 oop, full coverage, 10th visit PN    PT Start Time  1102    PT Stop Time  1148    PT Time Calculation (min)  46 min    Equipment Utilized During Treatment  Gait belt    Activity Tolerance  Patient tolerated treatment well    Behavior During Therapy  WFL for tasks assessed/performed       Past Medical History:  Diagnosis Date  . Arthritis   . Barrett esophagus   . Bronchitis   . CAD (coronary artery disease)    pt's wife denies any cardiac history on pt.  . Cancer ([place])    thyroid  . Carotid artery occlusion   . Constipation   . COPD (chronic obstructive pulmonary disease) ([place])   . Diabetes mellitus without complication ([place])   . Dyspnea   . GERD (gastroesophageal reflux disease)   . Headache    migraines  . History of thyroid cancer 2008  . History of thyroid cancer   . Hypertension   . OSA (obstructive sleep apnea)   . Pneumonia   . Restless leg   . S/P tendon repair    left thumb  . Sleep apnea with use of continuous positive airway pressure (CPAP)    [facility] , AHI  77cn central and obstrcutive. 16 cm water , 3 cm EPR.   . Thoracic ascending aortic aneurysm (HCC)    4.2 cm ascending TAA 08/18/17 CTA. Annual imaging recommended.  . Thyroid disease   . Ulcer    Peptic ulcer disease    Past Surgical History:  Procedure Laterality Date  . ANTERIOR CERVICAL DECOMP/DISCECTOMY FUSION N/A 09/03/2017   Procedure: ACDF - C3-C4 - C4-C5 - C5-C6;  Surgeon:  Kary Kos, MD;  Location: [facility];  Service: Neurosurgery;  Laterality: N/A;  . CAROTID ENDARTERECTOMY Left January 03, 2006   Dr. Amedeo Plenty  . COLONOSCOPY    . EYE SURGERY Bilateral    cataract surgery with lens implants  . game keepers thumb Right   . JOINT REPLACEMENT Left    knee X 2  . ROTATOR CUFF REPAIR Right   . THYROIDECTOMY  2008    There were no vitals filed for this visit.  Subjective Assessment - 06/13/18 1101    Subjective  He has not been walking with RW. He has been doing stand pivot transfers with RW & wife without issues. He does exercises some each day.     Patient is accompained by:  Family member    Pertinent History  CAD, DM2, COPD, hypoxic respiratory failure, HTN,    Patient Stated Goals  To walk in home & community, wife wants him to be able to care for himself if something happens to her    Currently in Pain?  Yes    Pain Score  0-No pain   was 5-6/10 then took meds   Pain Location  Shoulder    Pain  Orientation  Left    Pain Descriptors / Indicators  Sore;Discomfort    Pain Type  Chronic pain    Pain Onset  More than a month ago    Pain Frequency  Intermittent    Aggravating Factors   weather & use    Pain Relieving Factors  medications                       [facility] Adult PT Treatment/Exercise - 06/13/18 1100      Transfers   Transfers  Sit to Stand;Stand to [employer] Transfers    Sit to Stand  5: Supervision;With upper extremity assist;With armrests;From chair/3-in-1   to RW   Stand to Sit  5: Supervision;With upper extremity assist;With armrests;To chair/3-in-1   from [employer] Transfers  5: Supervision;4: Min guard   with RW between 2 chairs with armrests     Ambulation/Gait   Ambulation/Gait  Yes    Ambulation/Gait Assistance  4: Min assist    Ambulation/Gait Assistance Details  PT instructed pt & wife in how to assist with gait short distances between 2 chairs requiring only 90* turn to position to sit.  Verbal cues on  upright posture, position within RW, and step length. Pt's wife assisted 2nd gait with PT supervising.     Ambulation Distance (Feet)  25 Feet   25' X 2 with pt's wife assisting him   Assistive device  Rolling walker    Gait Pattern  Step-to pattern;Step-through pattern;Decreased step length - right;Decreased step length - left;Decreased stride length;Decreased hip/knee flexion - right;Decreased hip/knee flexion - left;Decreased dorsiflexion - left;Decreased dorsiflexion - right;Left foot flat;Right foot flat;Right flexed knee in stance;Left flexed knee in stance;Shuffle;Trunk flexed;Poor foot clearance - right;Poor foot clearance - left    Ambulation Surface  Indoor;Level    Stairs  Yes    Stairs Assistance  3: Mod assist    Stairs Assistance Details (indicate cue type and reason)  manual assist including weight shift. Descending with feet adducting causing hip instability. Will try visual cues for foot placement next time.     Stair Management Technique  Two rails;Step to pattern;Forwards   alternating lead LE   Number of Stairs  4   seated rest at top on 24" stool            PT Education - 06/13/18 1130    Education Details  assisting ambulation up to 20' with chairs positioned 48* to sit and when to start turn    Person(s) Educated  Patient;Spouse    Methods  Explanation;Demonstration    Comprehension  Verbalized understanding;Returned demonstration;Need further instruction       PT Short Term Goals - 06/13/18 2227      PT SHORT TERM GOAL #1   Title  Patient & wife demonstrate & verbalize understanding of updated HEP. (All updated STGs Target Date: 07/12/2018)    Time  1    Period  Months    Status  New    Target Date  07/12/18      PT SHORT TERM GOAL #2   Title  Patient performs stand-pivot transfer with RW between 2 chairs with armrests with supervision.     Baseline       Time  1    Period  Months    Status  New    Target Date  07/12/18      PT SHORT TERM GOAL #3    Title  Patient ambulates 47' between 2 chairs with RW with wife's assist safely.     Time  1    Period  Months    Status  New    Target Date  07/12/18      PT SHORT TERM GOAL #4   Title  Patient negotiates stairs with 2 rails step-to with minA.     Time  1    Period  Months    Status  New    Target Date  07/12/18      PT SHORT TERM GOAL #5   Title  Patient negotiates curb with RW with modA.     Time  1    Period  Months    Status  New    Target Date  07/12/18        PT Long Term Goals - 06/05/18 1643      PT LONG TERM GOAL #1   Title  Patient & wife demonstrate & verbalize understanding of ongoing exercise / fitness plan including community programs. (All LTGs 08/09/2018)    Time  3    Period  Months    Status  On-going      PT LONG TERM GOAL #2   Title  Consistent stand pivot transfer w/c < > bed/mat with RW and supervision    Time  3    Period  Months    Status  Revised      PT LONG TERM GOAL #3   Title  Patient and wife verbalize understanding of recommendations to manage spasms / hypertonic motions.     Time  3    Period  Months    Status  New      PT LONG TERM GOAL #4   Title  Sit to/from stand chairs with armrests to RW with supervision.    Time  3    Period  Months    Status  On-going      PT LONG TERM GOAL #5   Title  Patient standing balance with RW with BUE support staticly 2 minutes, scans environment and reaches within length of arm for objects with supervision.     Time  3    Period  Months    Status  On-going      PT LONG TERM GOAL #6   Title  Patient ambulates 100' with RW with minA.     Time  3    Period  Months    Status  On-going            Plan - 06/13/18 2231    Clinical Impression Statement  Patient's wife demonstrated ability to assist patient with limited distance gait but recommend CNA be present initially. Patient had adduction / scissoring of LEs with desending stairs which may improve with visual target.     Rehab  Potential  Good    Clinical Impairments Affecting Rehab Potential  weakness & ROM impaired 4 extremities & trunk; dependency transfers, bed mobility, standing & gait    PT Frequency  2x / week    PT Duration  Other (comment)   13 weeks, 90 days   PT Treatment/Interventions  ADLs/Self Care Home Management;Canalith Repostioning;Moist Heat;DME Instruction;Gait training;Stair training;Functional mobility training;Therapeutic activities;Therapeutic exercise;Balance training;Neuromuscular re-education;Patient/family education;Manual techniques;Passive range of motion;Vestibular    PT Next Visit Plan   Take walker to waiting area: begin to work on walking from waiting area <>treatment area.  Leg press or Nustep for endurance/strengthening.  Stair Therapist, nutritional.  Standing  with decreased UE support    PT Home Exercise Plan  Access Code: [email or web address]     Consulted and Agree with Plan of Care  Patient;Family member/caregiver    Family Member Consulted  wife       Patient will benefit from skilled therapeutic intervention in order to improve the following deficits and impairments:  Abnormal gait, Cardiopulmonary status limiting activity, Decreased activity tolerance, Decreased balance, Decreased endurance, Decreased knowledge of use of DME, Decreased mobility, Decreased range of motion, Decreased strength, Difficulty walking, Dizziness, Impaired flexibility, Impaired UE functional use, Postural dysfunction  Visit Diagnosis: Abnormal posture  Unsteadiness on feet  Other abnormalities of gait and mobility  History of falling  Muscle weakness (generalized)     Problem List Patient Active Problem List   Diagnosis Date Noted  . Postural urinary incontinence 05/20/2018  . 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 Myoclonic jerking 05/20/2018  . Myelopathy ([place]) 09/03/2017  . Acute respiratory failure with hypoxemia ([place]) 08/18/2017  .  Hypokalemia 08/18/2017  . Leukocytosis 08/18/2017  . Muscle atrophy of lower extremity 08/09/2017  . Bowel and bladder incontinence 08/09/2017  . Right-sided muscle weakness 08/09/2017  . Neuropathy 08/09/2017  . 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/2017  . Abnormality of gait 07/12/2017  . Myoclonic jerkings, 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/2017  . Acquired right foot drop 07/12/2017  . UTI (urinary tract infection) due to Enterococcus 07/12/2017  . Pressure injury of skin 06/21/2017  . Acute cystitis 06/20/2017  . Generalized weakness 06/20/2017  . Dehydration 06/20/2017  . Dyspnea 12/26/2016  . Chronic cough 12/26/2016  . Hypersomnia with sleep apnea 10/30/2016  . CSA (central sleep apnea) 10/30/2016  . Wheezing symptom 10/30/2016  . CPAP/BiPAP dependent 07/01/2015  . 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 RLS (restless legs syndrome) 08/25/2014  . Primary gout 08/25/2014  . OSA on CPAP 08/25/2014  . Severe obesity (BMI >= 40) ([place]) 08/25/2014  . Obesity (BMI 30-39.9) 02/18/2013  . Sleep apnea with use of continuous positive airway pressure (CPAP)   . 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 S/P TKR (total knee replacement) 11/29/2012  . History of thyroid cancer     Desare Duddy PT, DPT 06/13/2018, 10:33 PM  [facility] [address] [place], [place], [address] Phone: [phone]   Fax:  [phone]  Name: [name] MRN: [id number] Date of Birth: [birth year]

## 2018-06-17 ENCOUNTER — Ambulatory Visit: Payer: Medicare Other | Admitting: Physical Therapy

## 2018-06-17 ENCOUNTER — Encounter: Payer: Self-pay | Admitting: Physical Therapy

## 2018-06-17 DIAGNOSIS — R2681 Unsteadiness on feet: Secondary | ICD-10-CM

## 2018-06-17 DIAGNOSIS — M6281 Muscle weakness (generalized): Secondary | ICD-10-CM

## 2018-06-17 DIAGNOSIS — R293 Abnormal posture: Secondary | ICD-10-CM | POA: Diagnosis not present

## 2018-06-17 DIAGNOSIS — R2689 Other abnormalities of gait and mobility: Secondary | ICD-10-CM

## 2018-06-18 NOTE — Therapy (Signed)
[facility] [address] [place], [place], [address] Phone: [phone]   Fax:  [phone]  Physical Therapy Treatment  Patient Details  Name: [name] MRN: [id number] Date of Birth: 1940-09-03 Referring Provider (PT): Larey Seat, MD   Encounter Date: 06/17/2018  PT End of Session - 06/17/18 1629    Visit Number  32    Number of Visits  4    Date for PT Re-Evaluation  08/09/18    Authorization Type  [employer] Pt has met $5900 oop, full coverage, 10th visit PN    PT Start Time  1317    PT Stop Time  1400    PT Time Calculation (min)  43 min    Equipment Utilized During Treatment  Gait belt    Activity Tolerance  Patient tolerated treatment well    Behavior During Therapy  WFL for tasks assessed/performed       Past Medical History:  Diagnosis Date  . Arthritis   . Barrett esophagus   . Bronchitis   . CAD (coronary artery disease)    pt's wife denies any cardiac history on pt.  . Cancer ([place])    thyroid  . Carotid artery occlusion   . Constipation   . COPD (chronic obstructive pulmonary disease) ([facility])   . Diabetes mellitus without complication ([place])   . Dyspnea   . GERD (gastroesophageal reflux disease)   . Headache    migraines  . History of thyroid cancer 2008  . History of thyroid cancer   . Hypertension   . OSA (obstructive sleep apnea)   . Pneumonia   . Restless leg   . S/P tendon repair    left thumb  . Sleep apnea with use of continuous positive airway pressure (CPAP)    [facility] , AHI  77cn central and obstrcutive. 16 cm water , 3 cm EPR.   . Thoracic ascending aortic aneurysm (HCC)    4.2 cm ascending TAA 08/18/17 CTA. Annual imaging recommended.  . Thyroid disease   . Ulcer    Peptic ulcer disease    Past Surgical History:  Procedure Laterality Date  . ANTERIOR CERVICAL DECOMP/DISCECTOMY FUSION N/A 09/03/2017   Procedure: ACDF - C3-C4 - C4-C5 - C5-C6;  Surgeon:  Kary Kos, MD;  Location: [facility];  Service: Neurosurgery;  Laterality: N/A;  . CAROTID ENDARTERECTOMY Left January 03, 2006   Dr. Amedeo Plenty  . COLONOSCOPY    . EYE SURGERY Bilateral    cataract surgery with lens implants  . game keepers thumb Right   . JOINT REPLACEMENT Left    knee X 2  . ROTATOR CUFF REPAIR Right   . THYROIDECTOMY  2008    There were no vitals filed for this visit.  Subjective Assessment - 06/17/18 1323    Subjective  He walked with wife & aide with RW. He even was able to transition from hard wood to carpet.     Patient is accompained by:  Family member    Pertinent History  CAD, DM2, COPD, hypoxic respiratory failure, HTN,    Patient Stated Goals  To walk in home & community, wife wants him to be able to care for himself if something happens to her    Currently in Pain?  Yes    Pain Score  5     Pain Location  Shoulder    Pain Orientation  Left    Pain Descriptors / Indicators  Sore;Discomfort  Pain Type  Chronic pain    Pain Onset  More than a month ago    Pain Frequency  Intermittent    Aggravating Factors   weather & use    Pain Relieving Factors  medications      Gait Training:  Pt sit to/from stand from 20" w/c with armrest to RW with minA. Standing stretch trunk upright with RW with supervision.  PT demo technique for stand to sit to stand with chairs without armrests to RW.  Pt ambulated 20' turned 54* with minA/guard and stand to sit with minA for control to chair without armrests.  PT demo technique for turning 180* with RW.  Sit to stand chair without armrest to RW with modA & verbal cues. Pt ambulated 20' to w/c with 180* turn to position to sit with minA/ verbal, tactile cues. Pt ambulated 30' with RW with minA/guard & sit down in chair without armrest with min guard / verbal cues. Stand to sit chair without armrests with his w/c cushion for increase 2" ht with modA.  Stairs ascend with 2 rails step-to alternating lead LE with 1 person modA &  verbal, tactile cues. Seated rest. Pt descend 4 steps with 2 rails with color tiles for step position to decrease adduction & resulting hip instability with 2 person with modA (pt performed ~50% of work but 2 people needed for safety)                          PT Short Term Goals - 06/13/18 2227      PT SHORT TERM GOAL #1   Title  Patient & wife demonstrate & verbalize understanding of updated HEP. (All updated STGs Target Date: 07/12/2018)    Time  1    Period  Months    Status  New    Target Date  07/12/18      PT SHORT TERM GOAL #2   Title  Patient performs stand-pivot transfer with RW between 2 chairs with armrests with supervision.     Baseline       Time  1    Period  Months    Status  New    Target Date  07/12/18      PT SHORT TERM GOAL #3   Title  Patient ambulates 55' between 2 chairs with RW with wife's assist safely.     Time  1    Period  Months    Status  New    Target Date  07/12/18      PT SHORT TERM GOAL #4   Title  Patient negotiates stairs with 2 rails step-to with minA.     Time  1    Period  Months    Status  New    Target Date  07/12/18      PT SHORT TERM GOAL #5   Title  Patient negotiates curb with RW with modA.     Time  1    Period  Months    Status  New    Target Date  07/12/18        PT Long Term Goals - 06/05/18 1643      PT LONG TERM GOAL #1   Title  Patient & wife demonstrate & verbalize understanding of ongoing exercise / fitness plan including community programs. (All LTGs 08/09/2018)    Time  3    Period  Months    Status  On-going  PT LONG TERM GOAL #2   Title  Consistent stand pivot transfer w/c < > bed/mat with RW and supervision    Time  3    Period  Months    Status  Revised      PT LONG TERM GOAL #3   Title  Patient and wife verbalize understanding of recommendations to manage spasms / hypertonic motions.     Time  3    Period  Months    Status  New      PT LONG TERM GOAL #4   Title  Sit  to/from stand chairs with armrests to RW with supervision.    Time  3    Period  Months    Status  On-going      PT LONG TERM GOAL #5   Title  Patient standing balance with RW with BUE support staticly 2 minutes, scans environment and reaches within length of arm for objects with supervision.     Time  3    Period  Months    Status  On-going      PT LONG TERM GOAL #6   Title  Patient ambulates 26' with RW with minA.     Time  3    Period  Months    Status  On-going            Plan - 06/17/18 1818    Clinical Impression Statement  Today's skilled session focused on progressing gait to include sit to/from stand from chairs without armrests using UEs and turning 180* to position to sit down. PT also used color disc on stairs for visual reference which seemed to decrease scissoring /adduction & resulting hip instability.     Rehab Potential  Good    Clinical Impairments Affecting Rehab Potential  weakness & ROM impaired 4 extremities & trunk; dependency transfers, bed mobility, standing & gait    PT Frequency  2x / week    PT Duration  Other (comment)   13 weeks, 90 days   PT Treatment/Interventions  ADLs/Self Care Home Management;Canalith Repostioning;Moist Heat;DME Instruction;Gait training;Stair training;Functional mobility training;Therapeutic activities;Therapeutic exercise;Balance training;Neuromuscular re-education;Patient/family education;Manual techniques;Passive range of motion;Vestibular    PT Next Visit Plan   Take walker to waiting area: begin to work on walking from waiting area <>treatment area.  Continue to work on progressive gait skills and educate wife for carryover for gait with CNA at home.  Leg press or Nustep for endurance/strengthening.  Stair Therapist, nutritional.  Standing with decreased UE support    PT Home Exercise Plan  Access Code: [email or web address]     Consulted and Agree with Plan of Care  Patient;Family member/caregiver    Family Member Consulted  wife        Patient will benefit from skilled therapeutic intervention in order to improve the following deficits and impairments:  Abnormal gait, Cardiopulmonary status limiting activity, Decreased activity tolerance, Decreased balance, Decreased endurance, Decreased knowledge of use of DME, Decreased mobility, Decreased range of motion, Decreased strength, Difficulty walking, Dizziness, Impaired flexibility, Impaired UE functional use, Postural dysfunction  Visit Diagnosis: Abnormal posture  Unsteadiness on feet  Other abnormalities of gait and mobility  Muscle weakness (generalized)     Problem List Patient Active Problem List   Diagnosis Date Noted  . Postural urinary incontinence 05/20/2018  . 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 Myoclonic jerking 05/20/2018  . Myelopathy ([place]) 09/03/2017  . Acute respiratory failure with hypoxemia (  [place]) 08/18/2017  . Hypokalemia 08/18/2017  . Leukocytosis 08/18/2017  . Muscle atrophy of lower extremity 08/09/2017  . Bowel and bladder incontinence 08/09/2017  . Right-sided muscle weakness 08/09/2017  . Neuropathy 08/09/2017  . 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/2017  . Abnormality of gait 07/12/2017  . Myoclonic jerkings, 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/2017  . Acquired right foot drop 07/12/2017  . UTI (urinary tract infection) due to Enterococcus 07/12/2017  . Pressure injury of skin 06/21/2017  . Acute cystitis 06/20/2017  . Generalized weakness 06/20/2017  . Dehydration 06/20/2017  . Dyspnea 12/26/2016  . Chronic cough 12/26/2016  . Hypersomnia with sleep apnea 10/30/2016  . CSA (central sleep apnea) 10/30/2016  . Wheezing symptom 10/30/2016  . CPAP/BiPAP dependent 07/01/2015  . 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 RLS (restless legs syndrome) 08/25/2014  . Primary gout 08/25/2014  . OSA on CPAP 08/25/2014  . Severe obesity (BMI >= 40) ([facility]) 08/25/2014  . Obesity  (BMI 30-39.9) 02/18/2013  . Sleep apnea with use of continuous positive airway pressure (CPAP)   . 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 S/P TKR (total knee replacement) 11/29/2012  . History of thyroid cancer     Verlie Liotta PT, DPT 06/18/2018, 8:21 AM  [facility] [address] [place], [place], [address] Phone: [phone]   Fax:  [phone]  Name: NICHALOS BRENTON MRN: [id number] Date of Birth: June 16, 1941

## 2018-06-20 ENCOUNTER — Ambulatory Visit: Payer: Medicare Other | Attending: Neurology | Admitting: Physical Therapy

## 2018-06-20 ENCOUNTER — Encounter: Payer: Self-pay | Admitting: Physical Therapy

## 2018-06-20 DIAGNOSIS — R278 Other lack of coordination: Secondary | ICD-10-CM | POA: Insufficient documentation

## 2018-06-20 DIAGNOSIS — M25612 Stiffness of left shoulder, not elsewhere classified: Secondary | ICD-10-CM | POA: Diagnosis present

## 2018-06-20 DIAGNOSIS — R2689 Other abnormalities of gait and mobility: Secondary | ICD-10-CM | POA: Insufficient documentation

## 2018-06-20 DIAGNOSIS — R29818 Other symptoms and signs involving the nervous system: Secondary | ICD-10-CM | POA: Insufficient documentation

## 2018-06-20 DIAGNOSIS — R293 Abnormal posture: Secondary | ICD-10-CM | POA: Diagnosis present

## 2018-06-20 DIAGNOSIS — R2681 Unsteadiness on feet: Secondary | ICD-10-CM | POA: Diagnosis present

## 2018-06-20 DIAGNOSIS — Z9181 History of falling: Secondary | ICD-10-CM | POA: Diagnosis present

## 2018-06-20 DIAGNOSIS — R29898 Other symptoms and signs involving the musculoskeletal system: Secondary | ICD-10-CM

## 2018-06-20 DIAGNOSIS — M6281 Muscle weakness (generalized): Secondary | ICD-10-CM | POA: Diagnosis present

## 2018-06-20 NOTE — Therapy (Signed)
[facility] [address] [place], [place], [address] Phone: [phone]   Fax:  [phone]  Physical Therapy Treatment  Patient Details  Name: [name] MRN: [id number] Date of Birth: 1941-06-18 Referring Provider (PT): Larey Seat, MD   Encounter Date: 06/20/2018  PT End of Session - 06/20/18 1519    Visit Number  33    Number of Visits  52    Date for PT Re-Evaluation  08/09/18    Authorization Type  [employer] Pt has met $5900 oop, full coverage, 10th visit PN    PT Start Time  1315    PT Stop Time  1400    PT Time Calculation (min)  45 min    Equipment Utilized During Treatment  Gait belt    Activity Tolerance  Patient tolerated treatment well    Behavior During Therapy  [facility] for tasks assessed/performed       Past Medical History:  Diagnosis Date  . Arthritis   . Barrett esophagus   . Bronchitis   . CAD (coronary artery disease)    pt's wife denies any cardiac history on pt.  . Cancer ([place])    thyroid  . Carotid artery occlusion   . Constipation   . COPD (chronic obstructive pulmonary disease) ([place])   . Diabetes mellitus without complication ([place])   . Dyspnea   . GERD (gastroesophageal reflux disease)   . Headache    migraines  . History of thyroid cancer 2008  . History of thyroid cancer   . Hypertension   . OSA (obstructive sleep apnea)   . Pneumonia   . Restless leg   . S/P tendon repair    left thumb  . Sleep apnea with use of continuous positive airway pressure (CPAP)    2011 [facility] , AHI  77cn central and obstrcutive. 16 cm water , 3 cm EPR.   . Thoracic ascending aortic aneurysm (HCC)    4.2 cm ascending TAA 08/18/17 CTA. Annual imaging recommended.  . Thyroid disease   . Ulcer    Peptic ulcer disease    Past Surgical History:  Procedure Laterality Date  . ANTERIOR CERVICAL DECOMP/DISCECTOMY FUSION N/A 09/03/2017   Procedure: ACDF - C3-C4 - C4-C5 - C5-C6;  Surgeon:  Kary Kos, MD;  Location: [facility];  Service: Neurosurgery;  Laterality: N/A;  . CAROTID ENDARTERECTOMY Left January 03, 2006   Dr. Amedeo Plenty  . COLONOSCOPY    . EYE SURGERY Bilateral    cataract surgery with lens implants  . game keepers thumb Right   . JOINT REPLACEMENT Left    knee X 2  . ROTATOR CUFF REPAIR Right   . THYROIDECTOMY  2008    There were no vitals filed for this visit.  Subjective Assessment - 06/20/18 1315    Subjective  He has been working with wife & aide with RW.     Patient is accompained by:  Family member    Pertinent History  CAD, DM2, COPD, hypoxic respiratory failure, HTN,    Patient Stated Goals  To walk in home & community, wife wants him to be able to care for himself if something happens to her    Currently in Pain?  Yes    Pain Score  6     Pain Location  Shoulder    Pain Orientation  Left    Pain Descriptors / Indicators  Sore;Discomfort    Pain Type  Chronic pain  Pain Onset  More than a month ago    Pain Frequency  Intermittent    Aggravating Factors   weather & use    Pain Relieving Factors  medications                       [facility] Adult PT Treatment/Exercise - 06/20/18 1315      Transfers   Transfers  Sit to Stand;Stand to [employer] Transfers    Sit to Stand  5: Supervision;With upper extremity assist;With armrests;From chair/3-in-1;Other (comment)   to RW   Stand to Sit  5: Supervision;With upper extremity assist;With armrests;To chair/3-in-1;Other (comment)   from RW for stability   Stand Pivot Transfers  5: Supervision   with RW     Ambulation/Gait   Ambulation/Gait  Yes    Ambulation/Gait Assistance  4: Min assist;4: Min guard    Ambulation/Gait Assistance Details  PT took RW to waiting room & pt ambulated his max distance 45'  then second ambulation PT demonstrated how to bring w/c along safely.      Ambulation Distance (Feet)  38 Feet   38' & 20'   Assistive device  Rolling walker    Ambulation Surface   Indoor;Level    Ramp  3: Mod assist   RW, required seated rest after ascending   Ramp Details (indicate cue type and reason)  manual & verbal cues on upright posture, RW position / control   Pt required seated rest at top before descending    Curb  3: Mod assist   RW, required seated rest after ascending   Curb Details (indicate cue type and reason)  verbal & manual on powering up prior to moving RW, sequence and wt shift             PT Education - 06/20/18 1330    Education Details  increasing activity level with high frequency of short walks (~20') and 1-2 walks to his max tolerance    Person(s) Educated  Patient;Spouse    Methods  Explanation;Verbal cues    Comprehension  Verbalized understanding;Need further instruction;Verbal cues required       PT Short Term Goals - 06/13/18 2227      PT SHORT TERM GOAL #1   Title  Patient & wife demonstrate & verbalize understanding of updated HEP. (All updated STGs Target Date: 07/12/2018)    Time  1    Period  Months    Status  New    Target Date  07/12/18      PT SHORT TERM GOAL #2   Title  Patient performs stand-pivot transfer with RW between 2 chairs with armrests with supervision.     Baseline       Time  1    Period  Months    Status  New    Target Date  07/12/18      PT SHORT TERM GOAL #3   Title  Patient ambulates 73' between 2 chairs with RW with wife's assist safely.     Time  1    Period  Months    Status  New    Target Date  07/12/18      PT SHORT TERM GOAL #4   Title  Patient negotiates stairs with 2 rails step-to with minA.     Time  1    Period  Months    Status  New    Target Date  07/12/18  PT SHORT TERM GOAL #5   Title  Patient negotiates curb with RW with modA.     Time  1    Period  Months    Status  New    Target Date  07/12/18        PT Long Term Goals - 06/05/18 1643      PT LONG TERM GOAL #1   Title  Patient & wife demonstrate & verbalize understanding of ongoing exercise / fitness  plan including community programs. (All LTGs 08/09/2018)    Time  3    Period  Months    Status  On-going      PT LONG TERM GOAL #2   Title  Consistent stand pivot transfer w/c < > bed/mat with RW and supervision    Time  3    Period  Months    Status  Revised      PT LONG TERM GOAL #3   Title  Patient and wife verbalize understanding of recommendations to manage spasms / hypertonic motions.     Time  3    Period  Months    Status  New      PT LONG TERM GOAL #4   Title  Sit to/from stand chairs with armrests to RW with supervision.    Time  3    Period  Months    Status  On-going      PT LONG TERM GOAL #5   Title  Patient standing balance with RW with BUE support staticly 2 minutes, scans environment and reaches within length of arm for objects with supervision.     Time  3    Period  Months    Status  On-going      PT LONG TERM GOAL #6   Title  Patient ambulates 76' with RW with minA.     Time  3    Period  Months    Status  On-going            Plan - 06/20/18 2302    Clinical Impression Statement  Today's skilled session focused on education of increasing activity level including 1-2 max distance walks. PT also introduced negoatiating ramps & curbs but he required seated rests after each ascent / descent.     Rehab Potential  Good    Clinical Impairments Affecting Rehab Potential  weakness & ROM impaired 4 extremities & trunk; dependency transfers, bed mobility, standing & gait    PT Frequency  2x / week    PT Duration  Other (comment)   13 weeks, 90 days   PT Treatment/Interventions  ADLs/Self Care Home Management;Canalith Repostioning;Moist Heat;DME Instruction;Gait training;Stair training;Functional mobility training;Therapeutic activities;Therapeutic exercise;Balance training;Neuromuscular re-education;Patient/family education;Manual techniques;Passive range of motion;Vestibular    PT Next Visit Plan   Take walker to waiting area: begin to work on walking from  waiting area <>treatment area.  Continue training on ramps, curbs & stairs    PT Home Exercise Plan  Access Code: [email or web address]     Consulted and Agree with Plan of Care  Patient;Family member/caregiver    Family Member Consulted  wife       Patient will benefit from skilled therapeutic intervention in order to improve the following deficits and impairments:  Abnormal gait, Cardiopulmonary status limiting activity, Decreased activity tolerance, Decreased balance, Decreased endurance, Decreased knowledge of use of DME, Decreased mobility, Decreased range of motion, Decreased strength, Difficulty walking, Dizziness, Impaired flexibility, Impaired UE functional use, Postural dysfunction  Visit Diagnosis:  Abnormal posture  Unsteadiness on feet  Other abnormalities of gait and mobility  Muscle weakness (generalized)  History of falling  Other symptoms and signs involving the musculoskeletal system     Problem List Patient Active Problem List   Diagnosis Date Noted  . Postural urinary incontinence 05/20/2018  . 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 Myoclonic jerking 05/20/2018  . Myelopathy ([place]) 09/03/2017  . Acute respiratory failure with hypoxemia ([place]) 08/18/2017  . Hypokalemia 08/18/2017  . Leukocytosis 08/18/2017  . Muscle atrophy of lower extremity 08/09/2017  . Bowel and bladder incontinence 08/09/2017  . Right-sided muscle weakness 08/09/2017  . Neuropathy 08/09/2017  . 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/2017  . Abnormality of gait 07/12/2017  . Myoclonic jerkings, 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/2017  . Acquired right foot drop 07/12/2017  . UTI (urinary tract infection) due to Enterococcus 07/12/2017  . Pressure injury of skin 06/21/2017  . Acute cystitis 06/20/2017  . Generalized weakness 06/20/2017  . Dehydration 06/20/2017  . Dyspnea 12/26/2016  . Chronic cough 12/26/2016  . Hypersomnia with sleep apnea  10/30/2016  . CSA (central sleep apnea) 10/30/2016  . Wheezing symptom 10/30/2016  . CPAP/BiPAP dependent 07/01/2015  . 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 RLS (restless legs syndrome) 08/25/2014  . Primary gout 08/25/2014  . OSA on CPAP 08/25/2014  . Severe obesity (BMI >= 40) ([place]) 08/25/2014  . Obesity (BMI 30-39.9) 02/18/2013  . Sleep apnea with use of continuous positive airway pressure (CPAP)   . 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 S/P TKR (total knee replacement) 11/29/2012  . History of thyroid cancer     Sylvie Mifsud PT, DPT 06/20/2018, 11:08 PM  [facility] [address] [place], [place], [address] Phone: [phone]   Fax:  [phone]  Name: [name] MRN: [id number] Date of Birth: 1941-01-08

## 2018-06-24 ENCOUNTER — Ambulatory Visit: Payer: Medicare Other | Admitting: Physical Therapy

## 2018-06-24 ENCOUNTER — Encounter: Payer: Self-pay | Admitting: Physical Therapy

## 2018-06-24 DIAGNOSIS — Z9181 History of falling: Secondary | ICD-10-CM

## 2018-06-24 DIAGNOSIS — R2689 Other abnormalities of gait and mobility: Secondary | ICD-10-CM

## 2018-06-24 DIAGNOSIS — R293 Abnormal posture: Secondary | ICD-10-CM | POA: Diagnosis not present

## 2018-06-24 DIAGNOSIS — R29898 Other symptoms and signs involving the musculoskeletal system: Secondary | ICD-10-CM

## 2018-06-24 DIAGNOSIS — M6281 Muscle weakness (generalized): Secondary | ICD-10-CM

## 2018-06-24 DIAGNOSIS — R2681 Unsteadiness on feet: Secondary | ICD-10-CM

## 2018-06-25 NOTE — Therapy (Signed)
[facility] [address] [place], [place], [address] Phone: [phone]   Fax:  [phone]  Physical Therapy Treatment  Patient Details  Name: [name] MRN: [id number] Date of Birth: 04/14/1941 Referring Provider (PT): Larey Seat, MD   Encounter Date: 06/24/2018  PT End of Session - 06/24/18 1439    Visit Number  34    Number of Visits  89    Date for PT Re-Evaluation  08/09/18    Authorization Type  [employer] Pt has met $5900 oop, full coverage, 10th visit PN    PT Start Time  1230    PT Stop Time  1315    PT Time Calculation (min)  45 min    Equipment Utilized During Treatment  Gait belt    Activity Tolerance  Patient tolerated treatment well    Behavior During Therapy  [facility] for tasks assessed/performed       Past Medical History:  Diagnosis Date  . Arthritis   . Barrett esophagus   . Bronchitis   . CAD (coronary artery disease)    pt's wife denies any cardiac history on pt.  . Cancer ([place])    thyroid  . Carotid artery occlusion   . Constipation   . COPD (chronic obstructive pulmonary disease) ([place])   . Diabetes mellitus without complication ([place])   . Dyspnea   . GERD (gastroesophageal reflux disease)   . Headache    migraines  . History of thyroid cancer 2008  . History of thyroid cancer   . Hypertension   . OSA (obstructive sleep apnea)   . Pneumonia   . Restless leg   . S/P tendon repair    left thumb  . Sleep apnea with use of continuous positive airway pressure (CPAP)    [facility] , AHI  77cn central and obstrcutive. 16 cm water , 3 cm EPR.   . Thoracic ascending aortic aneurysm (HCC)    4.2 cm ascending TAA 08/18/17 CTA. Annual imaging recommended.  . Thyroid disease   . Ulcer    Peptic ulcer disease    Past Surgical History:  Procedure Laterality Date  . ANTERIOR CERVICAL DECOMP/DISCECTOMY FUSION N/A 09/03/2017   Procedure: ACDF - C3-C4 - C4-C5 - C5-C6;  Surgeon:  Kary Kos, MD;  Location: [facility];  Service: Neurosurgery;  Laterality: N/A;  . CAROTID ENDARTERECTOMY Left January 03, 2006   Dr. Amedeo Plenty  . COLONOSCOPY    . EYE SURGERY Bilateral    cataract surgery with lens implants  . game keepers thumb Right   . JOINT REPLACEMENT Left    knee X 2  . ROTATOR CUFF REPAIR Right   . THYROIDECTOMY  2008    There were no vitals filed for this visit.  Subjective Assessment - 06/24/18 1228    Subjective  When Maudie Mercury CNA is present, they can do some walking. She is there 6 days/wk 3hrs in morning & 3hrs in evening.     Patient is accompained by:  Family member    Pertinent History  CAD, DM2, COPD, hypoxic respiratory failure, HTN,    Patient Stated Goals  To walk in home & community, wife wants him to be able to care for himself if something happens to her    Currently in Pain?  Yes    Pain Score  7     Pain Location  Shoulder    Pain Orientation  Left    Pain Descriptors / Indicators  Sore;Discomfort    Pain Type  Chronic pain    Pain Onset  More than a month ago    Pain Frequency  Intermittent    Aggravating Factors   weather & use    Pain Relieving Factors  medications                       [facility] Adult PT Treatment/Exercise - 06/24/18 1230      Transfers   Transfers  Sit to Stand;Stand to [employer] Transfers    Sit to Stand  5: Supervision;With upper extremity assist;With armrests;From chair/3-in-1;Other (comment)   to RW   Stand to Sit  5: Supervision;With upper extremity assist;With armrests;To chair/3-in-1;Other (comment)   from [place] for stability   Stand Pivot Transfers  5: Supervision   with RW     Ambulation/Gait   Ambulation/Gait  Yes    Ambulation/Gait Assistance  4: Min assist;4: Min guard   MinA first walk & then Min guard    Ambulation/Gait Assistance Details  verbal & tactile cues on upright posture & not staring at floor.     Ambulation Distance (Feet)  65 Feet   20' & 24' end of session with max tolerable  distance   Assistive device  Rolling walker    Ambulation Surface  Indoor;Level    Ramp  3: Mod assist   RW, required seated rest after ascending   Ramp Details (indicate cue type and reason)  seated rest at top of ramp, manual/tactile & verbal cues on RW position, upright posture & wt shift.     Curb  3: Mod assist   RW, required seated rest after ascending   Curb Details (indicate cue type and reason)  verbal & manual /tactile cues on upright posture, sequence & wt shift             PT Education - 06/24/18 1300    Education Details  decreasing supervision level of aide for wife to assist gait in home.  Lowering pt to floor if necessary & calling non-emergent local fire station number for assistance.     Person(s) Educated  Patient;Spouse    Methods  Explanation;Verbal cues    Comprehension  Verbalized understanding       PT Short Term Goals - 06/13/18 2227      PT SHORT TERM GOAL #1   Title  Patient & wife demonstrate & verbalize understanding of updated HEP. (All updated STGs Target Date: 07/12/2018)    Time  1    Period  Months    Status  New    Target Date  07/12/18      PT SHORT TERM GOAL #2   Title  Patient performs stand-pivot transfer with RW between 2 chairs with armrests with supervision.     Baseline       Time  1    Period  Months    Status  New    Target Date  07/12/18      PT SHORT TERM GOAL #3   Title  Patient ambulates 18' between 2 chairs with RW with wife's assist safely.     Time  1    Period  Months    Status  New    Target Date  07/12/18      PT SHORT TERM GOAL #4   Title  Patient negotiates stairs with 2 rails step-to with minA.     Time  1    Period  Months  Status  New    Target Date  07/12/18      PT SHORT TERM GOAL #5   Title  Patient negotiates curb with RW with modA.     Time  1    Period  Months    Status  New    Target Date  07/12/18        PT Long Term Goals - 06/05/18 1643      PT LONG TERM GOAL #1   Title   Patient & wife demonstrate & verbalize understanding of ongoing exercise / fitness plan including community programs. (All LTGs 08/09/2018)    Time  3    Period  Months    Status  On-going      PT LONG TERM GOAL #2   Title  Consistent stand pivot transfer w/c < > bed/mat with RW and supervision    Time  3    Period  Months    Status  Revised      PT LONG TERM GOAL #3   Title  Patient and wife verbalize understanding of recommendations to manage spasms / hypertonic motions.     Time  3    Period  Months    Status  New      PT LONG TERM GOAL #4   Title  Sit to/from stand chairs with armrests to RW with supervision.    Time  3    Period  Months    Status  On-going      PT LONG TERM GOAL #5   Title  Patient standing balance with RW with BUE support staticly 2 minutes, scans environment and reaches within length of arm for objects with supervision.     Time  3    Period  Months    Status  On-going      PT LONG TERM GOAL #6   Title  Patient ambulates 10' with RW with minA.     Time  3    Period  Months    Status  On-going            Plan - 06/24/18 1800    Clinical Impression Statement  Patient seems to need more assistance initially when he has not ambulated or stood much for >24-48hrs at home. He was able to increase his maximal distance at end of session today to 65'.     Rehab Potential  Good    Clinical Impairments Affecting Rehab Potential  weakness & ROM impaired 4 extremities & trunk; dependency transfers, bed mobility, standing & gait    PT Frequency  2x / week    PT Duration  Other (comment)   13 weeks, 90 days   PT Treatment/Interventions  ADLs/Self Care Home Management;Canalith Repostioning;Moist Heat;DME Instruction;Gait training;Stair training;Functional mobility training;Therapeutic activities;Therapeutic exercise;Balance training;Neuromuscular re-education;Patient/family education;Manual techniques;Passive range of motion;Vestibular    PT Next Visit Plan    Take walker to waiting area: begin to work on walking from waiting area <>treatment area.  Continue training on ramps, curbs & stairs    PT Home Exercise Plan  Access Code: [email or web address]     Consulted and Agree with Plan of Care  Patient;Family member/caregiver    Family Member Consulted  wife       Patient will benefit from skilled therapeutic intervention in order to improve the following deficits and impairments:  Abnormal gait, Cardiopulmonary status limiting activity, Decreased activity tolerance, Decreased balance, Decreased endurance, Decreased knowledge of use of DME, Decreased mobility, Decreased range of  motion, Decreased strength, Difficulty walking, Dizziness, Impaired flexibility, Impaired UE functional use, Postural dysfunction  Visit Diagnosis: Abnormal posture  Unsteadiness on feet  Other abnormalities of gait and mobility  Muscle weakness (generalized)  History of falling  Other symptoms and signs involving the musculoskeletal system     Problem List Patient Active Problem List   Diagnosis Date Noted  . Postural urinary incontinence 05/20/2018  . 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 Myoclonic jerking 05/20/2018  . Myelopathy ([place]) 09/03/2017  . Acute respiratory failure with hypoxemia ([place]) 08/18/2017  . Hypokalemia 08/18/2017  . Leukocytosis 08/18/2017  . Muscle atrophy of lower extremity 08/09/2017  . Bowel and bladder incontinence 08/09/2017  . Right-sided muscle weakness 08/09/2017  . Neuropathy 08/09/2017  . 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/2017  . Abnormality of gait 07/12/2017  . Myoclonic jerkings, 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/2017  . Acquired right foot drop 07/12/2017  . UTI (urinary tract infection) due to Enterococcus 07/12/2017  . Pressure injury of skin 06/21/2017  . Acute cystitis 06/20/2017  . Generalized weakness 06/20/2017  . Dehydration 06/20/2017  . Dyspnea 12/26/2016   . Chronic cough 12/26/2016  . Hypersomnia with sleep apnea 10/30/2016  . CSA (central sleep apnea) 10/30/2016  . Wheezing symptom 10/30/2016  . CPAP/BiPAP dependent 07/01/2015  . 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 RLS (restless legs syndrome) 08/25/2014  . Primary gout 08/25/2014  . OSA on CPAP 08/25/2014  . Severe obesity (BMI >= 40) ([place]) 08/25/2014  . Obesity (BMI 30-39.9) 02/18/2013  . Sleep apnea with use of continuous positive airway pressure (CPAP)   . 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 S/P TKR (total knee replacement) 11/29/2012  . History of thyroid cancer     Kadeshia Kasparian PT, DPT 06/25/2018, 10:34 AM  [facility] [address] [place], [place], [address] Phone: [phone]   Fax:  [phone]  Name: [name] MRN: [id number] Date of Birth: 1940/09/16

## 2018-06-26 ENCOUNTER — Encounter: Payer: Self-pay | Admitting: Physical Therapy

## 2018-06-26 ENCOUNTER — Ambulatory Visit: Payer: Medicare Other | Admitting: Physical Therapy

## 2018-06-26 DIAGNOSIS — M6281 Muscle weakness (generalized): Secondary | ICD-10-CM

## 2018-06-26 DIAGNOSIS — R29898 Other symptoms and signs involving the musculoskeletal system: Secondary | ICD-10-CM

## 2018-06-26 DIAGNOSIS — R2681 Unsteadiness on feet: Secondary | ICD-10-CM

## 2018-06-26 DIAGNOSIS — R2689 Other abnormalities of gait and mobility: Secondary | ICD-10-CM

## 2018-06-26 DIAGNOSIS — R293 Abnormal posture: Secondary | ICD-10-CM | POA: Diagnosis not present

## 2018-06-26 NOTE — Therapy (Signed)
[facility] [address] [place], [place], [address] Phone: [phone]   Fax:  [phone]  Physical Therapy Treatment  Patient Details  Name: [name] MRN: [id number] Date of Birth: Feb 04, 1941 Referring Provider (PT): Larey Seat, MD   Encounter Date: 06/26/2018  PT End of Session - 06/26/18 1312    Visit Number  35    Number of Visits  48    Date for PT Re-Evaluation  08/09/18    Authorization Type  [employer] Pt has met $5900 oop, full coverage, 10th visit PN    PT Start Time  1228    PT Stop Time  1311    PT Time Calculation (min)  43 min    Equipment Utilized During Treatment  Gait belt    Activity Tolerance  Patient tolerated treatment well    Behavior During Therapy  [facility] for tasks assessed/performed       Past Medical History:  Diagnosis Date  . Arthritis   . Barrett esophagus   . Bronchitis   . CAD (coronary artery disease)    pt's wife denies any cardiac history on pt.  . Cancer ([place])    thyroid  . Carotid artery occlusion   . Constipation   . COPD (chronic obstructive pulmonary disease) ([place])   . Diabetes mellitus without complication ([place])   . Dyspnea   . GERD (gastroesophageal reflux disease)   . Headache    migraines  . History of thyroid cancer 2008  . History of thyroid cancer   . Hypertension   . OSA (obstructive sleep apnea)   . Pneumonia   . Restless leg   . S/P tendon repair    left thumb  . Sleep apnea with use of continuous positive airway pressure (CPAP)    [facility] , AHI  77cn central and obstrcutive. 16 cm water , 3 cm EPR.   . Thoracic ascending aortic aneurysm (HCC)    4.2 cm ascending TAA 08/18/17 CTA. Annual imaging recommended.  . Thyroid disease   . Ulcer    Peptic ulcer disease    Past Surgical History:  Procedure Laterality Date  . ANTERIOR CERVICAL DECOMP/DISCECTOMY FUSION N/A 09/03/2017   Procedure: ACDF - C3-C4 - C4-C5 - C5-C6;  Surgeon:  Kary Kos, MD;  Location: [facility];  Service: Neurosurgery;  Laterality: N/A;  . CAROTID ENDARTERECTOMY Left January 03, 2006   Dr. Amedeo Plenty  . COLONOSCOPY    . EYE SURGERY Bilateral    cataract surgery with lens implants  . game keepers thumb Right   . JOINT REPLACEMENT Left    knee X 2  . ROTATOR CUFF REPAIR Right   . THYROIDECTOMY  2008    There were no vitals filed for this visit.  Subjective Assessment - 06/26/18 1235    Subjective  He has walked ~2x/day when aide is there. Aide was ~10'-20' away but in line of sight yesterday.     Patient is accompained by:  Family member    Pertinent History  CAD, DM2, COPD, hypoxic respiratory failure, HTN,    Patient Stated Goals  To walk in home & community, wife wants him to be able to care for himself if something happens to her    Currently in Pain?  No/denies    Pain Onset  More than a month ago  [facility] PT Treatment/Exercise - 06/26/18 1230      Transfers   Transfers  Sit to Stand;Stand to [employer] Transfers    Sit to Stand  5: Supervision;With upper extremity assist;With armrests;From chair/3-in-1;Other (comment)   to RW   Stand to Sit  5: Supervision;With upper extremity assist;With armrests;To chair/3-in-1;Other (comment)   from RW for stability     Ambulation/Gait   Ambulation/Gait  Yes    Ambulation/Gait Assistance  4: Min assist;4: Min guard   MinA first walk & then Min guard    Ambulation/Gait Assistance Details  tactile & verbal cues on upright posture and step length    Ambulation Distance (Feet)  65 Feet   30', 20' & 65'   Assistive device  Rolling walker    Ambulation Surface  Indoor;Level    Ramp  4: Min assist   RW, required seated rest after ascending, 2nd person supervi   Ramp Details (indicate cue type and reason)  tactile & verbal cues on upright posture & wt shift    Curb  4: Min assist   RW, required seated rest after ascending, 2nd person supervi   Curb Details  (indicate cue type and reason)  tactile & verbal cues on upright posture, technique & wt shift               PT Short Term Goals - 06/13/18 2227      PT SHORT TERM GOAL #1   Title  Patient & wife demonstrate & verbalize understanding of updated HEP. (All updated STGs Target Date: 07/12/2018)    Time  1    Period  Months    Status  New    Target Date  07/12/18      PT SHORT TERM GOAL #2   Title  Patient performs stand-pivot transfer with RW between 2 chairs with armrests with supervision.     Baseline       Time  1    Period  Months    Status  New    Target Date  07/12/18      PT SHORT TERM GOAL #3   Title  Patient ambulates 63' between 2 chairs with RW with wife's assist safely.     Time  1    Period  Months    Status  New    Target Date  07/12/18      PT SHORT TERM GOAL #4   Title  Patient negotiates stairs with 2 rails step-to with minA.     Time  1    Period  Months    Status  New    Target Date  07/12/18      PT SHORT TERM GOAL #5   Title  Patient negotiates curb with RW with modA.     Time  1    Period  Months    Status  New    Target Date  07/12/18        PT Long Term Goals - 06/05/18 1643      PT LONG TERM GOAL #1   Title  Patient & wife demonstrate & verbalize understanding of ongoing exercise / fitness plan including community programs. (All LTGs 08/09/2018)    Time  3    Period  Months    Status  On-going      PT LONG TERM GOAL #2   Title  Consistent stand pivot transfer w/c < > bed/mat with RW and supervision    Time  3  Period  Months    Status  Revised      PT LONG TERM GOAL #3   Title  Patient and wife verbalize understanding of recommendations to manage spasms / hypertonic motions.     Time  3    Period  Months    Status  New      PT LONG TERM GOAL #4   Title  Sit to/from stand chairs with armrests to RW with supervision.    Time  3    Period  Months    Status  On-going      PT LONG TERM GOAL #5   Title  Patient standing  balance with RW with BUE support staticly 2 minutes, scans environment and reaches within length of arm for objects with supervision.     Time  3    Period  Months    Status  On-going      PT LONG TERM GOAL #6   Title  Patient ambulates 43' with RW with minA.     Time  3    Period  Months    Status  On-going            Plan - 06/26/18 2255    Clinical Impression Statement  Patient required less assistance on ramp & curb today but still requires rest with short burst of standing activities / gait. His gait is improving with better clearance & step length.     Rehab Potential  Good    Clinical Impairments Affecting Rehab Potential  weakness & ROM impaired 4 extremities & trunk; dependency transfers, bed mobility, standing & gait    PT Frequency  2x / week    PT Duration  Other (comment)   13 weeks, 90 days   PT Treatment/Interventions  ADLs/Self Care Home Management;Canalith Repostioning;Moist Heat;DME Instruction;Gait training;Stair training;Functional mobility training;Therapeutic activities;Therapeutic exercise;Balance training;Neuromuscular re-education;Patient/family education;Manual techniques;Passive range of motion;Vestibular    PT Next Visit Plan  Work towards [place]. progress gait with RW with wife's assistance    PT Home Exercise Plan  Access Code: [email or web address]     Consulted and Agree with Plan of Care  Patient;Family member/caregiver    Family Member Consulted  wife       Patient will benefit from skilled therapeutic intervention in order to improve the following deficits and impairments:  Abnormal gait, Cardiopulmonary status limiting activity, Decreased activity tolerance, Decreased balance, Decreased endurance, Decreased knowledge of use of DME, Decreased mobility, Decreased range of motion, Decreased strength, Difficulty walking, Dizziness, Impaired flexibility, Impaired UE functional use, Postural dysfunction  Visit Diagnosis: Abnormal posture  Unsteadiness on  feet  Other abnormalities of gait and mobility  Muscle weakness (generalized)  Other symptoms and signs involving the musculoskeletal system     Problem List Patient Active Problem List   Diagnosis Date Noted  . Postural urinary incontinence 05/20/2018  . 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 Myoclonic jerking 05/20/2018  . Myelopathy ([place]) 09/03/2017  . Acute respiratory failure with hypoxemia ([place]) 08/18/2017  . Hypokalemia 08/18/2017  . Leukocytosis 08/18/2017  . Muscle atrophy of lower extremity 08/09/2017  . Bowel and bladder incontinence 08/09/2017  . Right-sided muscle weakness 08/09/2017  . Neuropathy 08/09/2017  . 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/2017  . Abnormality of gait 07/12/2017  . Myoclonic jerkings, 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/2017  . Acquired right foot drop 07/12/2017  . UTI (urinary tract infection) due to Enterococcus 07/12/2017  . Pressure injury  of skin 06/21/2017  . Acute cystitis 06/20/2017  . Generalized weakness 06/20/2017  . Dehydration 06/20/2017  . Dyspnea 12/26/2016  . Chronic cough 12/26/2016  . Hypersomnia with sleep apnea 10/30/2016  . CSA (central sleep apnea) 10/30/2016  . Wheezing symptom 10/30/2016  . CPAP/BiPAP dependent 07/01/2015  . 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 RLS (restless legs syndrome) 08/25/2014  . Primary gout 08/25/2014  . OSA on CPAP 08/25/2014  . Severe obesity (BMI >= 40) ([place]) 08/25/2014  . Obesity (BMI 30-39.9) 02/18/2013  . Sleep apnea with use of continuous positive airway pressure (CPAP)   . 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 S/P TKR (total knee replacement) 11/29/2012  . History of thyroid cancer     Tearah Saulsbury PT, DPT 06/26/2018, 10:57 PM  [facility] [address] [place], [place], [address] Phone:  [phone]   Fax:  [phone]  Name: RONAV FURNEY MRN: [id number] Date of Birth: 04-Dec-1940

## 2018-07-01 ENCOUNTER — Ambulatory Visit: Payer: Medicare Other | Admitting: Physical Therapy

## 2018-07-01 ENCOUNTER — Encounter: Payer: Self-pay | Admitting: Physical Therapy

## 2018-07-01 DIAGNOSIS — R2689 Other abnormalities of gait and mobility: Secondary | ICD-10-CM

## 2018-07-01 DIAGNOSIS — R29898 Other symptoms and signs involving the musculoskeletal system: Secondary | ICD-10-CM

## 2018-07-01 DIAGNOSIS — M6281 Muscle weakness (generalized): Secondary | ICD-10-CM

## 2018-07-01 DIAGNOSIS — R293 Abnormal posture: Secondary | ICD-10-CM

## 2018-07-01 DIAGNOSIS — R2681 Unsteadiness on feet: Secondary | ICD-10-CM

## 2018-07-02 NOTE — Therapy (Signed)
[facility] [address] [place], [place], [address] Phone: [phone]   Fax:  [phone]  Physical Therapy Treatment  Patient Details  Name: [name] MRN: [id number] Date of Birth: [birth year] Referring Provider (PT): Larey Seat, MD   Encounter Date: 07/01/2018  PT End of Session - 07/01/18 1325    Visit Number  36    Number of Visits  54    Date for PT Re-Evaluation  08/09/18    Authorization Type  [employer] Pt has met $5900 oop, full coverage, 10th visit PN    PT Start Time  1228    PT Stop Time  1315    PT Time Calculation (min)  47 min    Equipment Utilized During Treatment  Gait belt    Activity Tolerance  Patient tolerated treatment well    Behavior During Therapy  [facility] for tasks assessed/performed       Past Medical History:  Diagnosis Date  . Arthritis   . Barrett esophagus   . Bronchitis   . CAD (coronary artery disease)    pt's wife denies any cardiac history on pt.  . Cancer ([place])    thyroid  . Carotid artery occlusion   . Constipation   . COPD (chronic obstructive pulmonary disease) ([place])   . Diabetes mellitus without complication ([place])   . Dyspnea   . GERD (gastroesophageal reflux disease)   . Headache    migraines  . History of thyroid cancer 2008  . History of thyroid cancer   . Hypertension   . OSA (obstructive sleep apnea)   . Pneumonia   . Restless leg   . S/P tendon repair    left thumb  . Sleep apnea with use of continuous positive airway pressure (CPAP)    [facility] , AHI  77cn central and obstrcutive. 16 cm water , 3 cm EPR.   . Thoracic ascending aortic aneurysm (HCC)    4.2 cm ascending TAA 08/18/17 CTA. Annual imaging recommended.  . Thyroid disease   . Ulcer    Peptic ulcer disease    Past Surgical History:  Procedure Laterality Date  . ANTERIOR CERVICAL DECOMP/DISCECTOMY FUSION N/A 09/03/2017   Procedure: ACDF - C3-C4 - C4-C5 - C5-C6;  Surgeon:  Kary Kos, MD;  Location: [facility];  Service: Neurosurgery;  Laterality: N/A;  . CAROTID ENDARTERECTOMY Left January 03, 2006   Dr. Amedeo Plenty  . COLONOSCOPY    . EYE SURGERY Bilateral    cataract surgery with lens implants  . game keepers thumb Right   . JOINT REPLACEMENT Left    knee X 2  . ROTATOR CUFF REPAIR Right   . THYROIDECTOMY  2008    There were no vitals filed for this visit.  Subjective Assessment - 07/01/18 1226    Subjective  He has been walking from bedroom to bathroom with wife assist and CNA supervising from other side of room but in line of sight. He had one near buckle but able to stabilize with assistance.     Patient is accompained by:  Family member    Pertinent History  CAD, DM2, COPD, hypoxic respiratory failure, HTN,    Patient Stated Goals  To walk in home & community, wife wants him to be able to care for himself if something happens to her    Currently in Pain?  No/denies    Pain Onset  More than a month ago  [facility] Adult PT Treatment/Exercise - 07/01/18 1230      Transfers   Transfers  Sit to Stand;Stand to [employer] Transfers    Sit to Stand  5: Supervision;With upper extremity assist;With armrests;From chair/3-in-1;Other (comment)   to RW   Stand to Sit  5: Supervision;With upper extremity assist;With armrests;To chair/3-in-1;Other (comment)   from [place] for stability     Ambulation/Gait   Ambulation/Gait  Yes    Ambulation/Gait Assistance  4: Min assist;4: Min guard   MinA first walk & then Min guard    Ambulation/Gait Assistance Details  demo, tactile & verbal cues on "power up" for stance or moving RW. Changed sequence so he stays within RW base better: RW moving after each LE step and extending trunk / LE with "power up"    Ambulation Distance (Feet)  30 Feet   30' 15' X 3   Assistive device  Rolling walker    Ambulation Surface  Indoor;Level    Ramp  4: Min assist   RW, required seated rest after ascending,  2nd person supervi   Ramp Details (indicate cue type and reason)  tactile & verbal cues on "power up" in stance    Curb  4: Min assist   RW, required seated rest after ascending, 2nd person supervi   Curb Details (indicate cue type and reason)  verbal cues on sequence & technique               PT Short Term Goals - 06/13/18 2227      PT SHORT TERM GOAL #1   Title  Patient & wife demonstrate & verbalize understanding of updated HEP. (All updated STGs Target Date: 07/12/2018)    Time  1    Period  Months    Status  New    Target Date  07/12/18      PT SHORT TERM GOAL #2   Title  Patient performs stand-pivot transfer with RW between 2 chairs with armrests with supervision.     Baseline       Time  1    Period  Months    Status  New    Target Date  07/12/18      PT SHORT TERM GOAL #3   Title  Patient ambulates 23' between 2 chairs with RW with wife's assist safely.     Time  1    Period  Months    Status  New    Target Date  07/12/18      PT SHORT TERM GOAL #4   Title  Patient negotiates stairs with 2 rails step-to with minA.     Time  1    Period  Months    Status  New    Target Date  07/12/18      PT SHORT TERM GOAL #5   Title  Patient negotiates curb with RW with modA.     Time  1    Period  Months    Status  New    Target Date  07/12/18        PT Long Term Goals - 06/05/18 1643      PT LONG TERM GOAL #1   Title  Patient & wife demonstrate & verbalize understanding of ongoing exercise / fitness plan including community programs. (All LTGs 08/09/2018)    Time  3    Period  Months    Status  On-going      PT LONG TERM GOAL #2  Title  Consistent stand pivot transfer w/c < > bed/mat with RW and supervision    Time  3    Period  Months    Status  Revised      PT LONG TERM GOAL #3   Title  Patient and wife verbalize understanding of recommendations to manage spasms / hypertonic motions.     Time  3    Period  Months    Status  New      PT LONG TERM  GOAL #4   Title  Sit to/from stand chairs with armrests to RW with supervision.    Time  3    Period  Months    Status  On-going      PT LONG TERM GOAL #5   Title  Patient standing balance with RW with BUE support staticly 2 minutes, scans environment and reaches within length of arm for objects with supervision.     Time  3    Period  Months    Status  On-going      PT LONG TERM GOAL #6   Title  Patient ambulates 24' with RW with minA.     Time  3    Period  Months    Status  On-going            Plan - 07/01/18 1800    Clinical Impression Statement  Patient was able to improve clearance of BLEs in swing with instruction in new sequence & "power up" in stance for improved LE & trunk extension. With muscle activity in new range he did not tolerate as much distance today.     Rehab Potential  Good    Clinical Impairments Affecting Rehab Potential  weakness & ROM impaired 4 extremities & trunk; dependency transfers, bed mobility, standing & gait    PT Frequency  2x / week    PT Duration  Other (comment)   13 weeks, 90 days   PT Treatment/Interventions  ADLs/Self Care Home Management;Canalith Repostioning;Moist Heat;DME Instruction;Gait training;Stair training;Functional mobility training;Therapeutic activities;Therapeutic exercise;Balance training;Neuromuscular re-education;Patient/family education;Manual techniques;Passive range of motion;Vestibular    PT Next Visit Plan  Work towards [place]. progress gait with RW with wife's assistance    PT Home Exercise Plan  Access Code: [email or web address]     Consulted and Agree with Plan of Care  Patient;Family member/caregiver    Family Member Consulted  wife       Patient will benefit from skilled therapeutic intervention in order to improve the following deficits and impairments:  Abnormal gait, Cardiopulmonary status limiting activity, Decreased activity tolerance, Decreased balance, Decreased endurance, Decreased knowledge of use of DME,  Decreased mobility, Decreased range of motion, Decreased strength, Difficulty walking, Dizziness, Impaired flexibility, Impaired UE functional use, Postural dysfunction  Visit Diagnosis: Abnormal posture  Unsteadiness on feet  Other abnormalities of gait and mobility  Muscle weakness (generalized)  Other symptoms and signs involving the musculoskeletal system     Problem List Patient Active Problem List   Diagnosis Date Noted  . Postural urinary incontinence 05/20/2018  . 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 Myoclonic jerking 05/20/2018  . Myelopathy ([place]) 09/03/2017  . Acute respiratory failure with hypoxemia ([place]) 08/18/2017  . Hypokalemia 08/18/2017  . Leukocytosis 08/18/2017  . Muscle atrophy of lower extremity 08/09/2017  . Bowel and bladder incontinence 08/09/2017  . Right-sided muscle weakness 08/09/2017  . Neuropathy 08/09/2017  . 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/2017  .  Abnormality of gait 07/12/2017  . Myoclonic jerkings, 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/2017  . Acquired right foot drop 07/12/2017  . UTI (urinary tract infection) due to Enterococcus 07/12/2017  . Pressure injury of skin 06/21/2017  . Acute cystitis 06/20/2017  . Generalized weakness 06/20/2017  . Dehydration 06/20/2017  . Dyspnea 12/26/2016  . Chronic cough 12/26/2016  . Hypersomnia with sleep apnea 10/30/2016  . CSA (central sleep apnea) 10/30/2016  . Wheezing symptom 10/30/2016  . CPAP/BiPAP dependent 07/01/2015  . 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 RLS (restless legs syndrome) 08/25/2014  . Primary gout 08/25/2014  . OSA on CPAP 08/25/2014  . Severe obesity (BMI >= 40) ([place]) 08/25/2014  . Obesity (BMI 30-39.9) 02/18/2013  . Sleep apnea with use of continuous positive airway pressure (CPAP)   . 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 S/P TKR (total knee  replacement) 11/29/2012  . History of thyroid cancer     Braelee Herrle PT, DPT 07/02/2018, 6:57 AM  [facility] [address] [place], [place], [address] Phone: [phone]   Fax:  [phone]  Name: [name] MRN: [id number] Date of Birth: Mar 01, 1941

## 2018-07-03 ENCOUNTER — Encounter: Payer: Self-pay | Admitting: Occupational Therapy

## 2018-07-03 ENCOUNTER — Encounter: Payer: Self-pay | Admitting: Physical Therapy

## 2018-07-03 ENCOUNTER — Ambulatory Visit: Payer: Medicare Other | Admitting: Physical Therapy

## 2018-07-03 ENCOUNTER — Ambulatory Visit: Payer: Medicare Other | Admitting: Occupational Therapy

## 2018-07-03 DIAGNOSIS — R2681 Unsteadiness on feet: Secondary | ICD-10-CM

## 2018-07-03 DIAGNOSIS — M25612 Stiffness of left shoulder, not elsewhere classified: Secondary | ICD-10-CM

## 2018-07-03 DIAGNOSIS — R293 Abnormal posture: Secondary | ICD-10-CM

## 2018-07-03 DIAGNOSIS — R29898 Other symptoms and signs involving the musculoskeletal system: Secondary | ICD-10-CM

## 2018-07-03 DIAGNOSIS — R29818 Other symptoms and signs involving the nervous system: Secondary | ICD-10-CM

## 2018-07-03 DIAGNOSIS — M6281 Muscle weakness (generalized): Secondary | ICD-10-CM

## 2018-07-03 DIAGNOSIS — R2689 Other abnormalities of gait and mobility: Secondary | ICD-10-CM

## 2018-07-03 DIAGNOSIS — R278 Other lack of coordination: Secondary | ICD-10-CM

## 2018-07-03 NOTE — Therapy (Signed)
[facility] [address] [place], [place], [address] Phone: [phone]   Fax:  [phone]  Occupational Therapy Treatment  Patient Details  Name: [name] MRN: [id number] Date of Birth: 1940/10/29 No data recorded  Encounter Date: 07/03/2018  OT End of Session - 07/03/18 1433    Visit Number  15    Number of Visits  27    Date for OT Re-Evaluation  58/30/94   certification sent 0/76/80   Authorization Type  coverage changed in January to [facility] Medicare    Authorization Time Period  90    Authorization - Visit Number  15    Authorization - Number of Visits  20    Activity Tolerance  Patient tolerated treatment well    Behavior During Therapy  [facility] for tasks assessed/performed       Past Medical History:  Diagnosis Date  . Arthritis   . Barrett esophagus   . Bronchitis   . CAD (coronary artery disease)    pt's wife denies any cardiac history on pt.  . Cancer ([place])    thyroid  . Carotid artery occlusion   . Constipation   . COPD (chronic obstructive pulmonary disease) ([place])   . Diabetes mellitus without complication ([place])   . Dyspnea   . GERD (gastroesophageal reflux disease)   . Headache    migraines  . History of thyroid cancer 2008  . History of thyroid cancer   . Hypertension   . OSA (obstructive sleep apnea)   . Pneumonia   . Restless leg   . S/P tendon repair    left thumb  . Sleep apnea with use of continuous positive airway pressure (CPAP)    2011 [facility] , AHI  77cn central and obstrcutive. 16 cm water , 3 cm EPR.   . Thoracic ascending aortic aneurysm (HCC)    4.2 cm ascending TAA 08/18/17 CTA. Annual imaging recommended.  . Thyroid disease   . Ulcer    Peptic ulcer disease    Past Surgical History:  Procedure Laterality Date  . ANTERIOR CERVICAL DECOMP/DISCECTOMY FUSION N/A 09/03/2017   Procedure: ACDF - C3-C4 - C4-C5 - C5-C6;  Surgeon: Kary Kos, MD;  Location: [facility];   Service: Neurosurgery;  Laterality: N/A;  . CAROTID ENDARTERECTOMY Left January 03, 2006   Dr. Amedeo Plenty  . COLONOSCOPY    . EYE SURGERY Bilateral    cataract surgery with lens implants  . game keepers thumb Right   . JOINT REPLACEMENT Left    knee X 2  . ROTATOR CUFF REPAIR Right   . THYROIDECTOMY  2008    There were no vitals filed for this visit.  Subjective Assessment - 07/03/18 1407    Subjective   I really would like to work on walking to the bathroom.      Patient is accompained by:  Family member    Pertinent History  ACDF C3- C6 09/03/2017. Pt began with LE weakness 05/2017.   Pt has been wheelchair bound starting in 06/2017. Pt in SNF for a month, then went home.  Pt then had ACDF C3- C6 09/03/2017 pt then d/c to SNF until 11/29/2017 for PT, OT and ST.  Pt discharged home with HHPT and OT which ended right before pt started with PT here in this clinic.  PMH:    Patient Stated Goals  get my arms working better and move better    Currently in Pain?  Yes  Pain Score  6     Pain Location  Shoulder    Pain Orientation  Left    Pain Descriptors / Indicators  Sore    Pain Type  Chronic pain    Pain Onset  More than a month ago    Pain Frequency  Intermittent    Aggravating Factors   use, weather     Pain Relieving Factors   medication                   OT Treatments/Exercises (OP) - 07/03/18 0001      ADLs   ADL Comments  Reviewed remaining goals, and future plan for OT.  Patient has had difficulty transitioning skills addressed in clinic to home setting.  Patient and wife state that patient feels apprehension of trying things (e.g. cooking, getting a cup of coffee) at home as his legs may buckle or he may spill.  Discussed many options, and patient felt many of those option were not feasible.  Discussed the importance of carrying over skills from clinic to home.        Neurological Re-education Exercises   Other Exercises 1  Sit to stand and dynamic stand balance at  table to address leg strength - ability to sustain active stance.  Patient able to free both hands from activity for task (clothespins) Overtly tied this activity to LB dressing - standing and freeing arms to pull up pants.                 OT Short Term Goals - 07/03/18 1431      OT SHORT TERM GOAL #6   Title  Pt will demonstate improved grip strength by at least 5 pounds total to assist with ADL tasks. (baseline L= 45, R =38)    Status  Not Met        OT Long Term Goals - 07/03/18 1431      OT LONG TERM GOAL #1   Title  Pt and wife will be mod I with upgraded home activities program - 05/21/2018    Status  On-going      OT LONG TERM GOAL #2   Title  Pt will demonstrate ability to comb hair mod I    Status  Achieved      OT LONG TERM GOAL #3   Title  Pt will be min a for simple hot familiar meal prep    Status  On-going      OT LONG TERM GOAL #4   Title  Pt will be mod I using reacher to don pants over feet.     Status  Achieved      OT LONG TERM GOAL #5   Title  Pt will demonstrate improved grip strength by at least 7 pounds total to assist with simple cooking tasks (baseline L= 45, R = 38).    Status  Achieved      OT LONG TERM GOAL #6   Title  Pt will verbalize understanding of adapted techniques for buttoning and zipping prn    Status  Achieved      OT LONG TERM GOAL #7   Title  Pt will be able to lift light weight object at mid reach during simple cooking tasks with LUE    Status  Achieved      OT LONG TERM GOAL #8   Title  Pt will be able to lift 2 pound object bilaterally from mid reach to assist with  functional activities at home.     Status  On-going            Plan - 07/03/18 1426    Clinical Impression Statement  Patient had been making progress in OT, and then was not scheduled for several weeks.  In discussion today, patient agreeable to continue OT to further decrease the vel of assistance / increase his independence with ADL/IADL.       Occupational Profile and client history currently impacting functional performance  husband, friend, reitiree.  PMH:  CAD, DM, COPD, hypoxic respiratory failure, HTN    Occupational performance deficits (Please refer to evaluation for details):  ADL's;IADL's;Leisure;Social Participation;Rest and Sleep    Rehab Potential  Fair    Current Impairments/barriers affecting progress:  length of time since onset, severity of deficits    OT Frequency  2x / week    OT Duration  6 weeks    OT Treatment/Interventions  Self-care/ADL training;Moist Heat;Therapeutic exercise;Neuromuscular education;DME and/or AE instruction;Passive range of motion;Manual Therapy;Therapist, nutritional;Therapeutic activities;Patient/family education;Balance training    Plan  Functional ambulation - manage doors, orient to seat, etc.  Flexibility LE's for LB dressing    Clinical Decision Making  Multiple treatment options, significant modification of task necessary    Consulted and Agree with Plan of Care  Patient;Family member/caregiver    Family Member Consulted  wife       Patient will benefit from skilled therapeutic intervention in order to improve the following deficits and impairments:  Decreased activity tolerance, Decreased balance, Decreased coordination, Decreased range of motion, Decreased mobility, Decreased knowledge of use of DME, Decreased strength, Difficulty walking, Impaired UE functional use, Impaired sensation, Impaired flexibility, Pain  Visit Diagnosis: Abnormal posture  Unsteadiness on feet  Muscle weakness (generalized)  Other symptoms and signs involving the musculoskeletal system  Other symptoms and signs involving the nervous system  Other lack of coordination  Stiffness of left shoulder, not elsewhere classified    Problem List Patient Active Problem List   Diagnosis Date Noted  . Postural urinary incontinence 05/20/2018  . 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 Myoclonic jerking 05/20/2018  . Myelopathy ([place]) 09/03/2017  . Acute respiratory failure with hypoxemia ([place]) 08/18/2017  . Hypokalemia 08/18/2017  . Leukocytosis 08/18/2017  . Muscle atrophy of lower extremity 08/09/2017  . Bowel and bladder incontinence 08/09/2017  . Right-sided muscle weakness 08/09/2017  . Neuropathy 08/09/2017  . 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/2017  . Abnormality of gait 07/12/2017  . Myoclonic jerkings, 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/2017  . Acquired right foot drop 07/12/2017  . UTI (urinary tract infection) due to Enterococcus 07/12/2017  . Pressure injury of skin 06/21/2017  . Acute cystitis 06/20/2017  . Generalized weakness 06/20/2017  . Dehydration 06/20/2017  . Dyspnea 12/26/2016  . Chronic cough 12/26/2016  . Hypersomnia with sleep apnea 10/30/2016  . CSA (central sleep apnea) 10/30/2016  . Wheezing symptom 10/30/2016  . CPAP/BiPAP dependent 07/01/2015  . 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 RLS (restless legs syndrome) 08/25/2014  . Primary gout 08/25/2014  . OSA on CPAP 08/25/2014  . Severe obesity (BMI >= 40) ([place]) 08/25/2014  . Obesity (BMI 30-39.9) 02/18/2013  . Sleep apnea with use of continuous positive airway pressure (CPAP)   . 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 S/P TKR (total knee replacement) 11/29/2012  . History of thyroid cancer  Mariah Milling, OTR/L 07/03/2018, 2:34 PM  [facility] [address] [place], [place], [address] Phone: [phone]   Fax:  [phone]  Name: [name] MRN: [id number] Date of Birth: [birth year]

## 2018-07-04 ENCOUNTER — Encounter: Payer: Self-pay | Admitting: Occupational Therapy

## 2018-07-04 ENCOUNTER — Ambulatory Visit: Payer: Medicare Other | Admitting: Occupational Therapy

## 2018-07-04 DIAGNOSIS — R2681 Unsteadiness on feet: Secondary | ICD-10-CM

## 2018-07-04 DIAGNOSIS — M6281 Muscle weakness (generalized): Secondary | ICD-10-CM

## 2018-07-04 DIAGNOSIS — M25612 Stiffness of left shoulder, not elsewhere classified: Secondary | ICD-10-CM

## 2018-07-04 DIAGNOSIS — R29898 Other symptoms and signs involving the musculoskeletal system: Secondary | ICD-10-CM

## 2018-07-04 DIAGNOSIS — R29818 Other symptoms and signs involving the nervous system: Secondary | ICD-10-CM

## 2018-07-04 DIAGNOSIS — R278 Other lack of coordination: Secondary | ICD-10-CM

## 2018-07-04 DIAGNOSIS — R293 Abnormal posture: Secondary | ICD-10-CM | POA: Diagnosis not present

## 2018-07-04 NOTE — Therapy (Signed)
[facility] [address] [place], [place], [address] Phone: [phone]   Fax:  [phone]  Physical Therapy Treatment  Patient Details  Name: [name] MRN: [id number] Date of Birth: 1940-12-23 Referring Provider (PT): Larey Seat, MD   Encounter Date: 07/03/2018  PT End of Session - 07/03/18 1321    Visit Number  37    Number of Visits  65    Date for PT Re-Evaluation  08/09/18    Authorization Type  [employer] Pt has met $5900 oop, full coverage, 10th visit PN    PT Start Time  1228    PT Stop Time  1315    PT Time Calculation (min)  47 min    Equipment Utilized During Treatment  Gait belt    Activity Tolerance  Patient tolerated treatment well    Behavior During Therapy  [facility] for tasks assessed/performed       Past Medical History:  Diagnosis Date  . Arthritis   . Barrett esophagus   . Bronchitis   . CAD (coronary artery disease)    pt's wife denies any cardiac history on pt.  . Cancer ([place])    thyroid  . Carotid artery occlusion   . Constipation   . COPD (chronic obstructive pulmonary disease) ([place])   . Diabetes mellitus without complication ([place])   . Dyspnea   . GERD (gastroesophageal reflux disease)   . Headache    migraines  . History of thyroid cancer 2008  . History of thyroid cancer   . Hypertension   . OSA (obstructive sleep apnea)   . Pneumonia   . Restless leg   . S/P tendon repair    left thumb  . Sleep apnea with use of continuous positive airway pressure (CPAP)    [facility] , AHI  77cn central and obstrcutive. 16 cm water , 3 cm EPR.   . Thoracic ascending aortic aneurysm (HCC)    4.2 cm ascending TAA 08/18/17 CTA. Annual imaging recommended.  . Thyroid disease   . Ulcer    Peptic ulcer disease    Past Surgical History:  Procedure Laterality Date  . ANTERIOR CERVICAL DECOMP/DISCECTOMY FUSION N/A 09/03/2017   Procedure: ACDF - C3-C4 - C4-C5 - C5-C6;  Surgeon:  Kary Kos, MD;  Location: [facility];  Service: Neurosurgery;  Laterality: N/A;  . CAROTID ENDARTERECTOMY Left January 03, 2006   Dr. Amedeo Plenty  . COLONOSCOPY    . EYE SURGERY Bilateral    cataract surgery with lens implants  . game keepers thumb Right   . JOINT REPLACEMENT Left    knee X 2  . ROTATOR CUFF REPAIR Right   . THYROIDECTOMY  2008    There were no vitals filed for this visit.  Subjective Assessment - 07/03/18 1227    Subjective  He walked this morning with wife with aide not line of sight but in ear shot. Pt reports he is still nervous with walking with wife as his weight is almost double her weight.     Patient is accompained by:  Family member    Pertinent History  CAD, DM2, COPD, hypoxic respiratory failure, HTN,    Patient Stated Goals  To walk in home & community, wife wants him to be able to care for himself if something happens to her    Currently in Pain?  Yes    Pain Score  6     Pain Location  Shoulder  Pain Orientation  Left    Pain Descriptors / Indicators  Sore;Discomfort    Pain Type  Chronic pain    Pain Onset  More than a month ago    Pain Frequency  Intermittent    Aggravating Factors   weather & use    Pain Relieving Factors  medications (took ~40 minutes prior to PT today so has not full kicked in)                       [facility] Adult PT Treatment/Exercise - 07/03/18 1230      Transfers   Transfers  Sit to Stand;Stand to [employer] Transfers    Sit to Stand  5: Supervision;With upper extremity assist;With armrests;From chair/3-in-1;Other (comment)   to RW   Stand to Sit  5: Supervision;With upper extremity assist;With armrests;To chair/3-in-1;Other (comment)   from RW for stability     Ambulation/Gait   Ambulation/Gait  Yes    Ambulation/Gait Assistance  4: Min guard   MinA first walk & then Min guard    Ambulation/Gait Assistance Details  verbal cues on sequence & upright "power up" posture    Ambulation Distance (Feet)  53 Feet    30', 25', 32'   Assistive device  Rolling walker    Ambulation Surface  Indoor;Level    Ramp  4: Min assist   RW, 2nd person supervision   Ramp Details (indicate cue type and reason)  verbal cues on technique, posture & RW control   07/04/18 first time with no manual assist on RW   Curb  4: Min assist   RW, 2nd person supervision   Curb Details (indicate cue type and reason)  verbal cues on technique, posture & RW control   07/04/18 first time with no manual assist on RW   Gait Comments  07/03/18 pt able to ascend ramp, descend curb  seated rest ascend curb, descend ramp seated rest      Neuro Re-ed    Neuro Re-ed Details   standing upright without UE support with min guard / supervision up to 25 seconds               PT Short Term Goals - 06/13/18 2227      PT SHORT TERM GOAL #1   Title  Patient & wife demonstrate & verbalize understanding of updated HEP. (All updated STGs Target Date: 07/12/2018)    Time  1    Period  Months    Status  New    Target Date  07/12/18      PT SHORT TERM GOAL #2   Title  Patient performs stand-pivot transfer with RW between 2 chairs with armrests with supervision.     Baseline       Time  1    Period  Months    Status  New    Target Date  07/12/18      PT SHORT TERM GOAL #3   Title  Patient ambulates 39' between 2 chairs with RW with wife's assist safely.     Time  1    Period  Months    Status  New    Target Date  07/12/18      PT SHORT TERM GOAL #4   Title  Patient negotiates stairs with 2 rails step-to with minA.     Time  1    Period  Months    Status  New    Target Date  07/12/18      PT SHORT TERM GOAL #5   Title  Patient negotiates curb with RW with modA.     Time  1    Period  Months    Status  New    Target Date  07/12/18        PT Long Term Goals - 06/05/18 1643      PT LONG TERM GOAL #1   Title  Patient & wife demonstrate & verbalize understanding of ongoing exercise / fitness plan including community  programs. (All LTGs 08/09/2018)    Time  3    Period  Months    Status  On-going      PT LONG TERM GOAL #2   Title  Consistent stand pivot transfer w/c < > bed/mat with RW and supervision    Time  3    Period  Months    Status  Revised      PT LONG TERM GOAL #3   Title  Patient and wife verbalize understanding of recommendations to manage spasms / hypertonic motions.     Time  3    Period  Months    Status  New      PT LONG TERM GOAL #4   Title  Sit to/from stand chairs with armrests to RW with supervision.    Time  3    Period  Months    Status  On-going      PT LONG TERM GOAL #5   Title  Patient standing balance with RW with BUE support staticly 2 minutes, scans environment and reaches within length of arm for objects with supervision.     Time  3    Period  Months    Status  On-going      PT LONG TERM GOAL #6   Title  Patient ambulates 44' with RW with minA.     Time  3    Period  Months    Status  On-going            Plan - 07/03/18 1741    Clinical Impression Statement  Patient continues to improve gait with less assistance including on ramps & curbs. PT added standing without UE support with supervision and pt tolerated up to 25 seconds.     Rehab Potential  Good    Clinical Impairments Affecting Rehab Potential  weakness & ROM impaired 4 extremities & trunk; dependency transfers, bed mobility, standing & gait    PT Frequency  2x / week    PT Duration  Other (comment)   13 weeks, 90 days   PT Treatment/Interventions  ADLs/Self Care Home Management;Canalith Repostioning;Moist Heat;DME Instruction;Gait training;Stair training;Functional mobility training;Therapeutic activities;Therapeutic exercise;Balance training;Neuromuscular re-education;Patient/family education;Manual techniques;Passive range of motion;Vestibular    PT Next Visit Plan  check STGs. progress gait with RW with wife's assistance    PT Home Exercise Plan  Access Code: [email or web address]     Consulted and  Agree with Plan of Care  Patient;Family member/caregiver    Family Member Consulted  wife       Patient will benefit from skilled therapeutic intervention in order to improve the following deficits and impairments:  Abnormal gait, Cardiopulmonary status limiting activity, Decreased activity tolerance, Decreased balance, Decreased endurance, Decreased knowledge of use of DME, Decreased mobility, Decreased range of motion, Decreased strength, Difficulty walking, Dizziness, Impaired flexibility, Impaired UE functional use, Postural dysfunction  Visit Diagnosis: Abnormal posture  Unsteadiness on feet  Other abnormalities of gait and mobility  Muscle weakness (generalized)  Other symptoms and signs involving the musculoskeletal system  Other symptoms and signs involving the nervous system     Problem List Patient Active Problem List   Diagnosis Date Noted  . Postural urinary incontinence 05/20/2018  . 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 Myoclonic jerking 05/20/2018  . Myelopathy ([place]) 09/03/2017  . Acute respiratory failure with hypoxemia ([place]) 08/18/2017  . Hypokalemia 08/18/2017  . Leukocytosis 08/18/2017  . Muscle atrophy of lower extremity 08/09/2017  . Bowel and bladder incontinence 08/09/2017  . Right-sided muscle weakness 08/09/2017  . Neuropathy 08/09/2017  . 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/2017  . Abnormality of gait 07/12/2017  . Myoclonic jerkings, 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/2017  . Acquired right foot drop 07/12/2017  . UTI (urinary tract infection) due to Enterococcus 07/12/2017  . Pressure injury of skin 06/21/2017  . Acute cystitis 06/20/2017  . Generalized weakness 06/20/2017  . Dehydration 06/20/2017  . Dyspnea 12/26/2016  . Chronic cough 12/26/2016  . Hypersomnia with sleep apnea 10/30/2016  . CSA (central sleep apnea) 10/30/2016  . Wheezing symptom 10/30/2016  . CPAP/BiPAP  dependent 07/01/2015  . 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 RLS (restless legs syndrome) 08/25/2014  . Primary gout 08/25/2014  . OSA on CPAP 08/25/2014  . Severe obesity (BMI >= 40) ([place]) 08/25/2014  . Obesity (BMI 30-39.9) 02/18/2013  . Sleep apnea with use of continuous positive airway pressure (CPAP)   . 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 S/P TKR (total knee replacement) 11/29/2012  . History of thyroid cancer     Lavena Loretto PT, DPT 07/04/2018, 5:44 PM  [facility] [address] [place], [place], [address] Phone: [phone]   Fax:  [phone]  Name: [name] MRN: [id number] Date of Birth: 1940/09/30

## 2018-07-04 NOTE — Therapy (Signed)
[facility] [address] [place], [place], [address] Phone: [phone]   Fax:  [phone]  Occupational Therapy Treatment  Patient Details  Name: [name] MRN: [id number] Date of Birth: 1941-01-21 No data recorded  Encounter Date: 07/04/2018  OT End of Session - 07/04/18 1706    Visit Number  16    Number of Visits  27    Date for OT Re-Evaluation  08/14/18    Authorization Type  coverage changed in January to [facility] Medicare    Authorization Time Period  90    Authorization - Visit Number  16    Authorization - Number of Visits  20    OT Start Time  5056    OT Stop Time  1530    OT Time Calculation (min)  43 min    Activity Tolerance  Patient tolerated treatment well    Behavior During Therapy  [facility] for tasks assessed/performed       Past Medical History:  Diagnosis Date  . Arthritis   . Barrett esophagus   . Bronchitis   . CAD (coronary artery disease)    pt's wife denies any cardiac history on pt.  . Cancer ([place])    thyroid  . Carotid artery occlusion   . Constipation   . COPD (chronic obstructive pulmonary disease) ([place])   . Diabetes mellitus without complication ([place])   . Dyspnea   . GERD (gastroesophageal reflux disease)   . Headache    migraines  . History of thyroid cancer 2008  . History of thyroid cancer   . Hypertension   . OSA (obstructive sleep apnea)   . Pneumonia   . Restless leg   . S/P tendon repair    left thumb  . Sleep apnea with use of continuous positive airway pressure (CPAP)    [facility] , AHI  77cn central and obstrcutive. 16 cm water , 3 cm EPR.   . Thoracic ascending aortic aneurysm (HCC)    4.2 cm ascending TAA 08/18/17 CTA. Annual imaging recommended.  . Thyroid disease   . Ulcer    Peptic ulcer disease    Past Surgical History:  Procedure Laterality Date  . ANTERIOR CERVICAL DECOMP/DISCECTOMY FUSION N/A 09/03/2017   Procedure: ACDF - C3-C4 - C4-C5 -  C5-C6;  Surgeon: Kary Kos, MD;  Location: [facility];  Service: Neurosurgery;  Laterality: N/A;  . CAROTID ENDARTERECTOMY Left January 03, 2006   Dr. Amedeo Plenty  . COLONOSCOPY    . EYE SURGERY Bilateral    cataract surgery with lens implants  . game keepers thumb Right   . JOINT REPLACEMENT Left    knee X 2  . ROTATOR CUFF REPAIR Right   . THYROIDECTOMY  2008    There were no vitals filed for this visit.  Subjective Assessment - 07/04/18 1653    Subjective   This is a good day.  They are not all good days.      Patient is accompained by:  Family member    Pertinent History  ACDF C3- C6 09/03/2017. Pt began with LE weakness 05/2017.   Pt has been wheelchair bound starting in 06/2017. Pt in SNF for a month, then went home.  Pt then had ACDF C3- C6 09/03/2017 pt then d/c to SNF until 11/29/2017 for PT, OT and ST.  Pt discharged home with HHPT and OT which ended right before pt started with PT here in this clinic.  PMH:  Patient Stated Goals  get my arms working better and move better    Currently in Pain?  Yes    Pain Score  5     Pain Location  Shoulder    Pain Orientation  Left    Pain Descriptors / Indicators  Sore    Pain Type  Chronic pain    Pain Onset  More than a month ago    Pain Frequency  Intermittent    Aggravating Factors   weather, use    Pain Relieving Factors  Medication, rest                   OT Treatments/Exercises (OP) - 07/04/18 0001      ADLs   Functional Mobility  Worked on functional mobility- walking with rolling walker and opening, closing doors, opening refrigerator door to obtain items.  Simulated patient's kitchen environment.  Worked on transporting  from standing at [employer] and sidestepping.  Also discussed transporting items with rolling cart if seated in wheelchair.  Patient able to transition sit to stand multiple times this session with no more than min assist.  Wife indicates they were able to walk into bathroom at home today.                OT Education - 07/04/18 1706    Education Details  rolling stool to help with item transport in kitchen.      Person(s) Educated  Patient;Spouse    Methods  Explanation;Demonstration    Comprehension  Verbalized understanding;Returned demonstration       OT Short Term Goals - 07/03/18 1431      OT SHORT TERM GOAL #6   Title  Pt will demonstate improved grip strength by at least 5 pounds total to assist with ADL tasks. (baseline L= 45, R =38)    Status  Not Met        OT Long Term Goals - 07/03/18 1431      OT LONG TERM GOAL #1   Title  Pt and wife will be mod I with upgraded home activities program - 05/21/2018    Status  On-going      OT LONG TERM GOAL #2   Title  Pt will demonstrate ability to comb hair mod I    Status  Achieved      OT LONG TERM GOAL #3   Title  Pt will be min a for simple hot familiar meal prep    Status  On-going      OT LONG TERM GOAL #4   Title  Pt will be mod I using reacher to don pants over feet.     Status  Achieved      OT LONG TERM GOAL #5   Title  Pt will demonstrate improved grip strength by at least 7 pounds total to assist with simple cooking tasks (baseline L= 45, R = 38).    Status  Achieved      OT LONG TERM GOAL #6   Title  Pt will verbalize understanding of adapted techniques for buttoning and zipping prn    Status  Achieved      OT LONG TERM GOAL #7   Title  Pt will be able to lift light weight object at mid reach during simple cooking tasks with LUE    Status  Achieved      OT LONG TERM GOAL #8   Title  Pt will be able to lift 2 pound object bilaterally  from mid reach to assist with functional activities at home.     Status  On-going            Plan - 07/04/18 1707    Clinical Impression Statement  Patient has shown significant progress with mobility in the past month, which will allow improved function for ADL/IADL    Occupational Profile and client history currently impacting functional performance   husband, friend, reitiree.  PMH:  CAD, DM, COPD, hypoxic respiratory failure, HTN    Occupational performance deficits (Please refer to evaluation for details):  ADL's;IADL's;Leisure;Social Participation;Rest and Sleep    Rehab Potential  Fair    Current Impairments/barriers affecting progress:  length of time since onset, severity of deficits    OT Frequency  2x / week    OT Duration  6 weeks    OT Treatment/Interventions  Self-care/ADL training;Moist Heat;Therapeutic exercise;Neuromuscular education;DME and/or AE instruction;Passive range of motion;Manual Therapy;Therapist, nutritional;Therapeutic activities;Patient/family education;Balance training    Plan  Flexibility LE's for LB dressing, Functional mobility, toilet transfers - toileting independence    Clinical Decision Making  Multiple treatment options, significant modification of task necessary    Consulted and Agree with Plan of Care  Patient;Family member/caregiver    Family Member Consulted  wife       Patient will benefit from skilled therapeutic intervention in order to improve the following deficits and impairments:  Decreased activity tolerance, Decreased balance, Decreased coordination, Decreased range of motion, Decreased mobility, Decreased knowledge of use of DME, Decreased strength, Difficulty walking, Impaired UE functional use, Impaired sensation, Impaired flexibility, Pain  Visit Diagnosis: Abnormal posture  Unsteadiness on feet  Muscle weakness (generalized)  Other symptoms and signs involving the musculoskeletal system  Other symptoms and signs involving the nervous system  Other lack of coordination  Stiffness of left shoulder, not elsewhere classified    Problem List Patient Active Problem List   Diagnosis Date Noted  . Postural urinary incontinence 05/20/2018  . 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 Myoclonic jerking 05/20/2018  .  Myelopathy ([place]) 09/03/2017  . Acute respiratory failure with hypoxemia ([place]) 08/18/2017  . Hypokalemia 08/18/2017  . Leukocytosis 08/18/2017  . Muscle atrophy of lower extremity 08/09/2017  . Bowel and bladder incontinence 08/09/2017  . Right-sided muscle weakness 08/09/2017  . Neuropathy 08/09/2017  . 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/2017  . Abnormality of gait 07/12/2017  . Myoclonic jerkings, 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/2017  . Acquired right foot drop 07/12/2017  . UTI (urinary tract infection) due to Enterococcus 07/12/2017  . Pressure injury of skin 06/21/2017  . Acute cystitis 06/20/2017  . Generalized weakness 06/20/2017  . Dehydration 06/20/2017  . Dyspnea 12/26/2016  . Chronic cough 12/26/2016  . Hypersomnia with sleep apnea 10/30/2016  . CSA (central sleep apnea) 10/30/2016  . Wheezing symptom 10/30/2016  . CPAP/BiPAP dependent 07/01/2015  . 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 RLS (restless legs syndrome) 08/25/2014  . Primary gout 08/25/2014  . OSA on CPAP 08/25/2014  . Severe obesity (BMI >= 40) ([place]) 08/25/2014  . Obesity (BMI 30-39.9) 02/18/2013  . Sleep apnea with use of continuous positive airway pressure (CPAP)   . 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 S/P TKR (total knee replacement) 11/29/2012  . History of thyroid cancer     Mariah Milling, OTR/L 07/04/2018, 5:10 PM  [facility] [address]  [place], [place], [address] Phone: [phone]   Fax:  [phone]  Name: [name] MRN: [id number] Date of Birth: 16-Jan-1941

## 2018-07-08 ENCOUNTER — Encounter: Payer: Self-pay | Admitting: Physical Therapy

## 2018-07-08 ENCOUNTER — Encounter: Payer: Self-pay | Admitting: Occupational Therapy

## 2018-07-08 ENCOUNTER — Ambulatory Visit: Payer: Medicare Other | Admitting: Occupational Therapy

## 2018-07-08 ENCOUNTER — Ambulatory Visit: Payer: Medicare Other | Admitting: Physical Therapy

## 2018-07-08 DIAGNOSIS — R2681 Unsteadiness on feet: Secondary | ICD-10-CM

## 2018-07-08 DIAGNOSIS — R29818 Other symptoms and signs involving the nervous system: Secondary | ICD-10-CM

## 2018-07-08 DIAGNOSIS — R278 Other lack of coordination: Secondary | ICD-10-CM

## 2018-07-08 DIAGNOSIS — M25612 Stiffness of left shoulder, not elsewhere classified: Secondary | ICD-10-CM

## 2018-07-08 DIAGNOSIS — R293 Abnormal posture: Secondary | ICD-10-CM

## 2018-07-08 DIAGNOSIS — M6281 Muscle weakness (generalized): Secondary | ICD-10-CM

## 2018-07-08 DIAGNOSIS — R29898 Other symptoms and signs involving the musculoskeletal system: Secondary | ICD-10-CM

## 2018-07-08 DIAGNOSIS — R2689 Other abnormalities of gait and mobility: Secondary | ICD-10-CM

## 2018-07-08 NOTE — Therapy (Signed)
[facility] [address] [place], [place], [address] Phone: [phone]   Fax:  [phone]  Occupational Therapy Treatment  Patient Details  Name: [name] MRN: [id number] Date of Birth: 12/29/1940 No data recorded  Encounter Date: 07/08/2018  OT End of Session - 07/08/18 1428    Visit Number  17    Number of Visits  27    Date for OT Re-Evaluation  08/14/18    Authorization Type  coverage changed in January to [facility] Medicare    Authorization Time Period  90    Authorization - Visit Number  17    Authorization - Number of Visits  20    OT Start Time  1316    OT Stop Time  1405    OT Time Calculation (min)  49 min    Activity Tolerance  Patient tolerated treatment well    Behavior During Therapy  [facility] for tasks assessed/performed       Past Medical History:  Diagnosis Date  . Arthritis   . Barrett esophagus   . Bronchitis   . CAD (coronary artery disease)    pt's wife denies any cardiac history on pt.  . Cancer ([place])    thyroid  . Carotid artery occlusion   . Constipation   . COPD (chronic obstructive pulmonary disease) ([place])   . Diabetes mellitus without complication ([place])   . Dyspnea   . GERD (gastroesophageal reflux disease)   . Headache    migraines  . History of thyroid cancer 2008  . History of thyroid cancer   . Hypertension   . OSA (obstructive sleep apnea)   . Pneumonia   . Restless leg   . S/P tendon repair    left thumb  . Sleep apnea with use of continuous positive airway pressure (CPAP)    [facility] , AHI  77cn central and obstrcutive. 16 cm water , 3 cm EPR.   . Thoracic ascending aortic aneurysm (HCC)    4.2 cm ascending TAA 08/18/17 CTA. Annual imaging recommended.  . Thyroid disease   . Ulcer    Peptic ulcer disease    Past Surgical History:  Procedure Laterality Date  . ANTERIOR CERVICAL DECOMP/DISCECTOMY FUSION N/A 09/03/2017   Procedure: ACDF - C3-C4 - C4-C5 -  C5-C6;  Surgeon: Kary Kos, MD;  Location: [facility];  Service: Neurosurgery;  Laterality: N/A;  . CAROTID ENDARTERECTOMY Left January 03, 2006   Dr. Amedeo Plenty  . COLONOSCOPY    . EYE SURGERY Bilateral    cataract surgery with lens implants  . game keepers thumb Right   . JOINT REPLACEMENT Left    knee X 2  . ROTATOR CUFF REPAIR Right   . THYROIDECTOMY  2008    There were no vitals filed for this visit.  Subjective Assessment - 07/08/18 1419    Subjective   I stood at the kitchen sink and watched the birds    Patient is accompained by:  Family member    Patient Stated Goals  get my arms working better and move better    Currently in Pain?  Yes    Pain Score  3     Pain Location  Shoulder    Pain Orientation  Left    Pain Descriptors / Indicators  Sore    Pain Type  Chronic pain    Pain Onset  More than a month ago    Pain Frequency  Intermittent  Aggravating Factors   weather, use    Pain Relieving Factors  medication, rest                   OT Treatments/Exercises (OP) - 07/08/18 0001      ADLs   Grooming  Patient plans to have high stoll in bathroom to allow him to prop up tall to see into the mirror to shave with electric razor.      Toileting  Worked with patient and wife to problem solve improved independence with toileting.  Patient currently wears a pull up brief due to urinary incontinence, and discussed that a tabbed diaper may mean less undressing each time he uses bathroom.  Practiced with tabbed diaper, and patient appears to have dexterity to shape diaper around thighs.  Worked on stand balance freeing one hand at a time to simulate pulling diaper,pants up over hips.  Patient able to free both hands form support for several seconds as would be needed to fasten diaper and pants.  Worked on hip stretches to help patient sit in wide cross legged position to put pants over feet.  Patient with hip weakness and tightness R.>L.  Also demonstrated leg lifter as at this  moment, patient reliant on UE's to cross LE's.      Bathing  Patient is currently using a long slide board, and sliding from drop arm bariatric commode into shower with assistance of paid caregiver.               OT Education - 07/08/18 1427    Education Details  seated hip stretch to aide with LB dressing. leg lifter    Person(s) Educated  Patient;Spouse    Methods  Explanation;Demonstration    Comprehension  Verbalized understanding;Returned demonstration       OT Short Term Goals - 07/03/18 1431      OT SHORT TERM GOAL #6   Title  Pt will demonstate improved grip strength by at least 5 pounds total to assist with ADL tasks. (baseline L= 45, R =38)    Status  Not Met        OT Long Term Goals - 07/03/18 1431      OT LONG TERM GOAL #1   Title  Pt and wife will be mod I with upgraded home activities program - 05/21/2018    Status  On-going      OT LONG TERM GOAL #2   Title  Pt will demonstrate ability to comb hair mod I    Status  Achieved      OT LONG TERM GOAL #3   Title  Pt will be min a for simple hot familiar meal prep    Status  On-going      OT LONG TERM GOAL #4   Title  Pt will be mod I using reacher to don pants over feet.     Status  Achieved      OT LONG TERM GOAL #5   Title  Pt will demonstrate improved grip strength by at least 7 pounds total to assist with simple cooking tasks (baseline L= 45, R = 38).    Status  Achieved      OT LONG TERM GOAL #6   Title  Pt will verbalize understanding of adapted techniques for buttoning and zipping prn    Status  Achieved      OT LONG TERM GOAL #7   Title  Pt will be able to lift light weight object  at mid reach during simple cooking tasks with LUE    Status  Achieved      OT LONG TERM GOAL #8   Title  Pt will be able to lift 2 pound object bilaterally from mid reach to assist with functional activities at home.     Status  On-going            Plan - 07/08/18 1428    Clinical Impression Statement   Patient showing more confidence and competence with static and dynamic stand activities which relate to ADL.  Patient with improved ability to sustain activation in LE's for stand tasks, which allows more functional opportunities for UE's.      Occupational Profile and client history currently impacting functional performance  husband, friend, reitiree.  PMH:  CAD, DM, COPD, hypoxic respiratory failure, HTN    Occupational performance deficits (Please refer to evaluation for details):  ADL's;IADL's;Leisure;Social Participation;Rest and Sleep    Rehab Potential  Fair    Current Impairments/barriers affecting progress:  length of time since onset, severity of deficits    OT Frequency  2x / week    OT Duration  6 weeks    OT Treatment/Interventions  Self-care/ADL training;Moist Heat;Therapeutic exercise;Neuromuscular education;DME and/or AE instruction;Passive range of motion;Manual Therapy;Therapist, nutritional;Therapeutic activities;Patient/family education;Balance training    Plan  check on toileting, donning /doffing brief or diaper, stool in kitchen for cooking task    Clinical Decision Making  Multiple treatment options, significant modification of task necessary    Consulted and Agree with Plan of Care  Patient;Family member/caregiver    Family Member Consulted  wife       Patient will benefit from skilled therapeutic intervention in order to improve the following deficits and impairments:  Decreased activity tolerance, Decreased balance, Decreased coordination, Decreased range of motion, Decreased mobility, Decreased knowledge of use of DME, Decreased strength, Difficulty walking, Impaired UE functional use, Impaired sensation, Impaired flexibility, Pain  Visit Diagnosis: Abnormal posture  Unsteadiness on feet  Muscle weakness (generalized)  Other symptoms and signs involving the musculoskeletal system  Other symptoms and signs involving the nervous system  Other lack of  coordination  Stiffness of left shoulder, not elsewhere classified    Problem List Patient Active Problem List   Diagnosis Date Noted  . Postural urinary incontinence 05/20/2018  . 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 Myoclonic jerking 05/20/2018  . Myelopathy ([place]) 09/03/2017  . Acute respiratory failure with hypoxemia ([place]) 08/18/2017  . Hypokalemia 08/18/2017  . Leukocytosis 08/18/2017  . Muscle atrophy of lower extremity 08/09/2017  . Bowel and bladder incontinence 08/09/2017  . Right-sided muscle weakness 08/09/2017  . Neuropathy 08/09/2017  . 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/2017  . Abnormality of gait 07/12/2017  . Myoclonic jerkings, 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/2017  . Acquired right foot drop 07/12/2017  . UTI (urinary tract infection) due to Enterococcus 07/12/2017  . Pressure injury of skin 06/21/2017  . Acute cystitis 06/20/2017  . Generalized weakness 06/20/2017  . Dehydration 06/20/2017  . Dyspnea 12/26/2016  . Chronic cough 12/26/2016  . Hypersomnia with sleep apnea 10/30/2016  . CSA (central sleep apnea) 10/30/2016  . Wheezing symptom 10/30/2016  . CPAP/BiPAP dependent 07/01/2015  . 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 RLS (restless legs syndrome) 08/25/2014  . Primary gout 08/25/2014  . OSA on CPAP 08/25/2014  . Severe obesity (BMI >= 40) ([place]) 08/25/2014  . Obesity (BMI 30-39.9) 02/18/2013  .  Sleep apnea with use of continuous positive airway pressure (CPAP)   . 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 S/P TKR (total knee replacement) 11/29/2012  . History of thyroid cancer     Mariah Milling, OTR/L 07/08/2018, 2:32 PM  [facility] [address] [place], [place], [address] Phone: [phone]   Fax:  [phone]  Name: DEWARREN LEDBETTER MRN: [id number] Date of  Birth: Oct 30, 1940

## 2018-07-09 NOTE — Therapy (Signed)
[facility] [address] [place], [place], [address] Phone: [phone]   Fax:  [phone]  Physical Therapy Treatment  Patient Details  Name: [name] MRN: [id number] Date of Birth: 1941/03/01 Referring Provider (PT): Larey Seat, MD   Encounter Date: 07/08/2018  PT End of Session - 07/08/18 1500    Visit Number  38    Number of Visits  63    Date for PT Re-Evaluation  08/09/18    Authorization Type  [employer] Pt has met $5900 oop, full coverage, 10th visit PN    PT Start Time  1230    PT Stop Time  1315    PT Time Calculation (min)  45 min    Equipment Utilized During Treatment  Gait belt    Activity Tolerance  Patient tolerated treatment well    Behavior During Therapy  [facility] for tasks assessed/performed       Past Medical History:  Diagnosis Date  . Arthritis   . Barrett esophagus   . Bronchitis   . CAD (coronary artery disease)    pt's wife denies any cardiac history on pt.  . Cancer ([place])    thyroid  . Carotid artery occlusion   . Constipation   . COPD (chronic obstructive pulmonary disease) ([place])   . Diabetes mellitus without complication ([place])   . Dyspnea   . GERD (gastroesophageal reflux disease)   . Headache    migraines  . History of thyroid cancer 2008  . History of thyroid cancer   . Hypertension   . OSA (obstructive sleep apnea)   . Pneumonia   . Restless leg   . S/P tendon repair    left thumb  . Sleep apnea with use of continuous positive airway pressure (CPAP)    [facility] , AHI  77cn central and obstrcutive. 16 cm water , 3 cm EPR.   . Thoracic ascending aortic aneurysm (HCC)    4.2 cm ascending TAA 08/18/17 CTA. Annual imaging recommended.  . Thyroid disease   . Ulcer    Peptic ulcer disease    Past Surgical History:  Procedure Laterality Date  . ANTERIOR CERVICAL DECOMP/DISCECTOMY FUSION N/A 09/03/2017   Procedure: ACDF - C3-C4 - C4-C5 - C5-C6;  Surgeon:  Kary Kos, MD;  Location: [place];  Service: Neurosurgery;  Laterality: N/A;  . CAROTID ENDARTERECTOMY Left January 03, 2006   Dr. Amedeo Plenty  . COLONOSCOPY    . EYE SURGERY Bilateral    cataract surgery with lens implants  . game keepers thumb Right   . JOINT REPLACEMENT Left    knee X 2  . ROTATOR CUFF REPAIR Right   . THYROIDECTOMY  2008    There were no vitals filed for this visit.  Subjective Assessment - 07/08/18 1225    Subjective  His wife went out of town so he was with his son-law. The worked on standing but not walking. His wife is sick after the trip.     Patient is accompained by:  Family member    Pertinent History  CAD, DM2, COPD, hypoxic respiratory failure, HTN,    Patient Stated Goals  To walk in home & community, wife wants him to be able to care for himself if something happens to her    Currently in Pain?  Yes    Pain Score  7     Pain Location  Shoulder    Pain Orientation  Left  Pain Descriptors / Indicators  Sore    Pain Onset  More than a month ago    Pain Frequency  Intermittent    Aggravating Factors   weather, use    Pain Relieving Factors  Medication, rest                       [facility] Adult PT Treatment/Exercise - 07/08/18 1230      Transfers   Transfers  Sit to Stand;Stand to [employer] Transfers    Sit to Stand  5: Supervision;With upper extremity assist;With armrests;From chair/3-in-1;Other (comment)   to RW   Stand to Sit  5: Supervision;With upper extremity assist;With armrests;To chair/3-in-1;Other (comment)   from RW for stability   Stand Pivot Transfers  5: Supervision   turning 90* from RW to standing at table w/BUE support   Stand Pivot Transfer Details (indicate cue type and reason)  verbal cues      Ambulation/Gait   Ambulation/Gait  Yes    Ambulation/Gait Assistance  4: Min guard;5: Supervision    Ambulation/Gait Assistance Details  verbal cues on posture    Ambulation Distance (Feet)  55 Feet   30' & 55'    Assistive device  Rolling walker    Ambulation Surface  Indoor;Level    Ramp  4: Min assist   RW, no 2nd person supervision   Ramp Details (indicate cue type and reason)  verbal cues on posture & "powering up" prior to advancing RW or LEs    Curb  4: Min assist   RW, no 2nd person supervision   Curb Details (indicate cue type and reason)  verbal & tactile cues on technique    Gait Comments  07/08/2018 pt able to ascend ramp, descend curb  seated rest ascend curb, descend ramp seated rest      Neuro Re-ed    Neuro Re-ed Details   standing upright without UE support with min guard / supervision up to 25 seconds             PT Education - 07/08/18 1300    Education Details  sitting 24" stool near table or counter working on activity increasing tolerance /time without UE or back support and intermittently straightening posture    Person(s) Educated  Patient;Spouse    Methods  Explanation;Verbal cues    Comprehension  Verbalized understanding;Verbal cues required;Need further instruction       PT Short Term Goals - 07/08/18 1800      PT SHORT TERM GOAL #1   Title  Patient & wife demonstrate & verbalize understanding of updated HEP. (All updated STGs Target Date: 07/12/2018)    Baseline  MET 07/08/2018    Time  1    Period  Months    Status  Achieved      PT SHORT TERM GOAL #2   Title  Patient performs stand-pivot transfer with RW between 2 chairs with armrests with supervision.     Baseline  MET 07/08/2018    Time  1    Period  Months    Status  Achieved      PT SHORT TERM GOAL #3   Title  Patient ambulates 33' between 2 chairs with RW with wife's assist safely.     Time  1    Period  Months    Status  On-going    Target Date  07/12/18      PT SHORT TERM GOAL #4   Title  Patient negotiates stairs with 2 rails step-to with minA.     Time  1    Period  Months    Status  On-going    Target Date  07/12/18      PT SHORT TERM GOAL #5   Title  Patient negotiates curb with  RW with modA.     Baseline  MET 07/08/2018    Time  1    Period  Months    Status  Achieved        PT Long Term Goals - 06/05/18 1643      PT LONG TERM GOAL #1   Title  Patient & wife demonstrate & verbalize understanding of ongoing exercise / fitness plan including community programs. (All LTGs 08/09/2018)    Time  3    Period  Months    Status  On-going      PT LONG TERM GOAL #2   Title  Consistent stand pivot transfer w/c < > bed/mat with RW and supervision    Time  3    Period  Months    Status  Revised      PT LONG TERM GOAL #3   Title  Patient and wife verbalize understanding of recommendations to manage spasms / hypertonic motions.     Time  3    Period  Months    Status  New      PT LONG TERM GOAL #4   Title  Sit to/from stand chairs with armrests to RW with supervision.    Time  3    Period  Months    Status  On-going      PT LONG TERM GOAL #5   Title  Patient standing balance with RW with BUE support staticly 2 minutes, scans environment and reaches within length of arm for objects with supervision.     Time  3    Period  Months    Status  On-going      PT LONG TERM GOAL #6   Title  Patient ambulates 60' with RW with minA.     Time  3    Period  Months    Status  On-going            Plan - 07/08/18 1800    Clinical Impression Statement  Patient able to ambulate with less assistance on level surfaces, ramps & curbs. Patient's wife not feeling well today so PT did not check gait STG with her assistance.     Rehab Potential  Good    Clinical Impairments Affecting Rehab Potential  weakness & ROM impaired 4 extremities & trunk; dependency transfers, bed mobility, standing & gait    PT Frequency  2x / week    PT Duration  Other (comment)   13 weeks, 90 days   PT Treatment/Interventions  ADLs/Self Care Home Management;Canalith Repostioning;Moist Heat;DME Instruction;Gait training;Stair training;Functional mobility training;Therapeutic  activities;Therapeutic exercise;Balance training;Neuromuscular re-education;Patient/family education;Manual techniques;Passive range of motion;Vestibular    PT Next Visit Plan  check remaining STGs. progress gait with RW with wife's assistance    PT Home Exercise Plan  Access Code: [email or web address]     Consulted and Agree with Plan of Care  Patient;Family member/caregiver    Family Member Consulted  wife       Patient will benefit from skilled therapeutic intervention in order to improve the following deficits and impairments:  Abnormal gait, Cardiopulmonary status limiting activity, Decreased activity tolerance, Decreased balance, Decreased endurance, Decreased knowledge of use of  DME, Decreased mobility, Decreased range of motion, Decreased strength, Difficulty walking, Dizziness, Impaired flexibility, Impaired UE functional use, Postural dysfunction  Visit Diagnosis: Abnormal posture  Unsteadiness on feet  Muscle weakness (generalized)  Other symptoms and signs involving the musculoskeletal system  Other symptoms and signs involving the nervous system  Other abnormalities of gait and mobility     Problem List Patient Active Problem List   Diagnosis Date Noted  . Postural urinary incontinence 05/20/2018  . 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 Myoclonic jerking 05/20/2018  . Myelopathy ([place]) 09/03/2017  . Acute respiratory failure with hypoxemia ([place]) 08/18/2017  . Hypokalemia 08/18/2017  . Leukocytosis 08/18/2017  . Muscle atrophy of lower extremity 08/09/2017  . Bowel and bladder incontinence 08/09/2017  . Right-sided muscle weakness 08/09/2017  . Neuropathy 08/09/2017  . 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/2017  . Abnormality of gait 07/12/2017  . Myoclonic jerkings, 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/2017  . Acquired right foot drop 07/12/2017  . UTI (urinary tract infection) due to Enterococcus 07/12/2017  .  Pressure injury of skin 06/21/2017  . Acute cystitis 06/20/2017  . Generalized weakness 06/20/2017  . Dehydration 06/20/2017  . Dyspnea 12/26/2016  . Chronic cough 12/26/2016  . Hypersomnia with sleep apnea 10/30/2016  . CSA (central sleep apnea) 10/30/2016  . Wheezing symptom 10/30/2016  . CPAP/BiPAP dependent 07/01/2015  . 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 RLS (restless legs syndrome) 08/25/2014  . Primary gout 08/25/2014  . OSA on CPAP 08/25/2014  . Severe obesity (BMI >= 40) ([place]) 08/25/2014  . Obesity (BMI 30-39.9) 02/18/2013  . Sleep apnea with use of continuous positive airway pressure (CPAP)   . 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 S/P TKR (total knee replacement) 11/29/2012  . History of thyroid cancer     Kashvi Prevette PT, DPT 07/09/2018, 6:14 AM  [facility] [address] [place], [place], [address] Phone: [phone]   Fax:  [phone]  Name: CHANDON LAZCANO MRN: [id number] Date of Birth: [birth year]

## 2018-07-10 ENCOUNTER — Ambulatory Visit: Payer: Medicare Other | Admitting: Physical Therapy

## 2018-07-10 ENCOUNTER — Encounter: Payer: Self-pay | Admitting: Physical Therapy

## 2018-07-10 DIAGNOSIS — R2689 Other abnormalities of gait and mobility: Secondary | ICD-10-CM

## 2018-07-10 DIAGNOSIS — R293 Abnormal posture: Secondary | ICD-10-CM | POA: Diagnosis not present

## 2018-07-10 DIAGNOSIS — R2681 Unsteadiness on feet: Secondary | ICD-10-CM

## 2018-07-10 NOTE — Therapy (Addendum)
[facility] [address] [place], [place], [address] Phone: [phone]   Fax:  [phone]  Physical Therapy Treatment  Patient Details  Name: [name] MRN: [id number] Date of Birth: [birth year] Referring Provider (PT): Larey Seat, MD   Encounter Date: 07/10/2018  PT End of Session - 07/10/18 2120    Visit Number  39    Number of Visits  48    Date for PT Re-Evaluation  08/09/18    Authorization Type  [employer] Pt has met $5900 oop, full coverage, 10th visit PN    PT Start Time  1100    PT Stop Time  1145    PT Time Calculation (min)  45 min    Activity Tolerance  Patient tolerated treatment well    Behavior During Therapy  [facility] for tasks assessed/performed       Past Medical History:  Diagnosis Date  . Arthritis   . Barrett esophagus   . Bronchitis   . CAD (coronary artery disease)    pt's wife denies any cardiac history on pt.  . Cancer ([place])    thyroid  . Carotid artery occlusion   . Constipation   . COPD (chronic obstructive pulmonary disease) ([place])   . Diabetes mellitus without complication ([place])   . Dyspnea   . GERD (gastroesophageal reflux disease)   . Headache    migraines  . History of thyroid cancer 2008  . History of thyroid cancer   . Hypertension   . OSA (obstructive sleep apnea)   . Pneumonia   . Restless leg   . S/P tendon repair    left thumb  . Sleep apnea with use of continuous positive airway pressure (CPAP)    [facility] , AHI  77cn central and obstrcutive. 16 cm water , 3 cm EPR.   . Thoracic ascending aortic aneurysm (HCC)    4.2 cm ascending TAA 08/18/17 CTA. Annual imaging recommended.  . Thyroid disease   . Ulcer    Peptic ulcer disease    Past Surgical History:  Procedure Laterality Date  . ANTERIOR CERVICAL DECOMP/DISCECTOMY FUSION N/A 09/03/2017   Procedure: ACDF - C3-C4 - C4-C5 - C5-C6;  Surgeon: Kary Kos, MD;  Location: [facility];  Service:  Neurosurgery;  Laterality: N/A;  . CAROTID ENDARTERECTOMY Left January 03, 2006   Dr. Amedeo Plenty  . COLONOSCOPY    . EYE SURGERY Bilateral    cataract surgery with lens implants  . game keepers thumb Right   . JOINT REPLACEMENT Left    knee X 2  . ROTATOR CUFF REPAIR Right   . THYROIDECTOMY  2008    There were no vitals filed for this visit.  Subjective Assessment - 07/10/18 1105    Subjective  Pt had the longest walk at home this morning after shower, walked out of bathroom and down hallway and almost made it to the computer with wife's minimal assistance!  Also sat for 30 minutes at computer on stool; did well but buttocks did become sore.  Wife will begin helping with early voting in February and pt will be home alone for a few hours each day.  Will need to be able to standing and reach up into cabinets and microwave for meal prep.  Barrier for toileting is pulling brief with liner back up in place.    Patient is accompained by:  Family member    Pertinent History  CAD, DM2, COPD, hypoxic respiratory failure,  HTN,    Patient Stated Goals  To walk in home & community, wife wants him to be able to care for himself if something happens to her    Currently in Pain?  Yes    Pain Onset  More than a month ago                       [facility] Adult PT Treatment/Exercise - 07/10/18 2110      Transfers   Transfers  Sit to Stand;Stand to Sit;Stand Pivot Transfers    Sit to Stand  5: Supervision;With upper extremity assist    Stand to Sit  5: Supervision;With upper extremity assist    Stand Pivot Transfers  4: Min guard;With armrests    Stand Pivot Transfer Details (indicate cue type and reason)  with RW      Ambulation/Gait   Ambulation/Gait  Yes    Ambulation/Gait Assistance  4: Min guard    Ambulation/Gait Assistance Details  with wife providing min A to simulate ambulating at home    Ambulation Distance (Feet)  30 Feet    Assistive device  Rolling walker    Ambulation Surface   Level;Indoor    Stairs  Yes    Stairs Assistance  4: Min assist    Stairs Assistance Details (indicate cue type and reason)  no assistance needed today to stabilize R knee into extension    Stair Management Technique  Two rails;Step to pattern;Forwards    Number of Stairs  4    Height of Stairs  6      Balance   Balance Assessed  Yes      Static Standing Balance   Static Standing - Balance Support  Right upper extremity supported;Left upper extremity supported    Static Standing - Level of Assistance  4: Min assist    Static Standing - Comment/# of Minutes  standing at counter with one UE support on counter with contralateral UE reaching from counter top to lower upper shelf to place and remove 6 bottles with therapist providing verbal cues for use of inhalation to improve thoracic extension and reach; also provided verbal cues to maintain weight shift and WB through RLE when reaching due to tendency to lean too far to L.  Also performed reaching into lower cabinet <> upper cabinet reaching down and to the R with cues to perform squat with weight shift to R for improved balance and safety.             PT Education - 07/10/18 2119    Education Details  technique for safely reaching into tall cabinet when standing in kitchen; advised pt and wife to perform 2-3 trials of pt reaching and making his own lunch with wife present before pt performs on his own    Person(s) Educated  Patient;Spouse    Methods  Explanation;Demonstration    Comprehension  Verbalized understanding;Returned demonstration       PT Short Term Goals - 07/10/18 1114      PT SHORT TERM GOAL #1   Title  Patient & wife demonstrate & verbalize understanding of updated HEP. (All updated STGs Target Date: 07/12/2018)    Baseline  MET 07/08/2018    Time  1    Period  Months    Status  Achieved      PT SHORT TERM GOAL #2   Title  Patient performs stand-pivot transfer with RW between 2 chairs with armrests with  supervision.  Baseline  MET 07/08/2018    Time  1    Period  Months    Status  Achieved      PT SHORT TERM GOAL #3   Title  Patient ambulates 51' between 2 chairs with RW with wife's assist safely.     Time  1    Period  Months    Status  Achieved      PT SHORT TERM GOAL #4   Title  Patient negotiates stairs with 2 rails step-to with minA.     Time  1    Period  Months    Status  Achieved      PT SHORT TERM GOAL #5   Title  Patient negotiates curb with RW with modA.     Baseline  MET 07/08/2018    Time  1    Period  Months    Status  Achieved        PT Long Term Goals - 07/10/18 2135      PT LONG TERM GOAL #1   Title  Patient & wife demonstrate & verbalize understanding of ongoing exercise / fitness plan including community programs. (All LTGs 08/09/2018)    Time  3    Period  Months    Status  On-going      PT LONG TERM GOAL #2   Title  Consistent stand pivot transfer w/c < > bed/mat with RW and MOD I    Time  3    Period  Months    Status  Revised      PT LONG TERM GOAL #3   Title  Patient and wife verbalize understanding of recommendations to manage spasms / hypertonic motions.     Time  3    Period  Months    Status  New      PT LONG TERM GOAL #4   Title  Sit to/from stand chairs with armrests to RW MOD I    Time  3    Period  Months    Status  Revised      PT LONG TERM GOAL #5   Title  Patient standing balance with one UE support x 5-8 minutes while scanning environment and reaching outside BOS (high, low, laterally) with supervision    Time  3    Period  Months    Status  Revised      PT LONG TERM GOAL #6   Title  Patient ambulates 115' over indoor surfaces and 25' outside over pavement with RW with minA.     Time  3    Period  Months    Status  Revised            Plan - 07/10/18 2122    Clinical Impression Statement  Completed assessment of STG with pt demonstrating significant progress and meeting all STG.  Completed session focusing  on practicing reaching high and low into cabinets to simulate pt in his kitchen to prepare for when wife is away during the day.  Will continue to review activities pt can safely do at home when alone.    Rehab Potential  Good    Clinical Impairments Affecting Rehab Potential  weakness & ROM impaired 4 extremities & trunk; dependency transfers, bed mobility, standing & gait    PT Frequency  2x / week    PT Duration  Other (comment)   13 weeks, 90 days   PT Treatment/Interventions  ADLs/Self Care Home Management;Canalith Repostioning;Moist Heat;DME Instruction;Gait  training;Stair training;Functional mobility training;Therapeutic activities;Therapeutic exercise;Balance training;Neuromuscular re-education;Patient/family education;Manual techniques;Passive range of motion;Vestibular    PT Next Visit Plan  10th visit PN!  Continue to review activities that pt can do at home without wife there; review reaching into cabinets and retrieving items safely, stair negotiation, increasing distance with gait with RW.      PT Home Exercise Plan  Access Code: [email or web address]     Consulted and Agree with Plan of Care  Patient;Family member/caregiver    Family Member Consulted  wife       Patient will benefit from skilled therapeutic intervention in order to improve the following deficits and impairments:  Abnormal gait, Cardiopulmonary status limiting activity, Decreased activity tolerance, Decreased balance, Decreased endurance, Decreased knowledge of use of DME, Decreased mobility, Decreased range of motion, Decreased strength, Difficulty walking, Dizziness, Impaired flexibility, Impaired UE functional use, Postural dysfunction  Visit Diagnosis: Abnormal posture  Unsteadiness on feet  Other abnormalities of gait and mobility     Problem List Patient Active Problem List   Diagnosis Date Noted  . Postural urinary incontinence 05/20/2018  . Myelopathy concurrent with and due to spinal stenosis of cervical  region ([place]) 05/20/2018  . Myoclonic jerking 05/20/2018  . Myelopathy ([place]) 09/03/2017  . Acute respiratory failure with hypoxemia ([place]) 08/18/2017  . Hypokalemia 08/18/2017  . Leukocytosis 08/18/2017  . Muscle atrophy of lower extremity 08/09/2017  . Bowel and bladder incontinence 08/09/2017  . Right-sided muscle weakness 08/09/2017  . Neuropathy 08/09/2017  . 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/2017  . Abnormality of gait 07/12/2017  . Myoclonic jerkings, 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/2017  . Acquired right foot drop 07/12/2017  . UTI (urinary tract infection) due to Enterococcus 07/12/2017  . Pressure injury of skin 06/21/2017  . Acute cystitis 06/20/2017  . Generalized weakness 06/20/2017  . Dehydration 06/20/2017  . Dyspnea 12/26/2016  . Chronic cough 12/26/2016  . Hypersomnia with sleep apnea 10/30/2016  . CSA (central sleep apnea) 10/30/2016  . Wheezing symptom 10/30/2016  . CPAP/BiPAP dependent 07/01/2015  . 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 RLS (restless legs syndrome) 08/25/2014  . Primary gout 08/25/2014  . OSA on CPAP 08/25/2014  . Severe obesity (BMI >= 40) ([place]) 08/25/2014  . Obesity (BMI 30-39.9) 02/18/2013  . Sleep apnea with use of continuous positive airway pressure (CPAP)   . 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 S/P TKR (total knee replacement) 11/29/2012  . History of thyroid cancer     Rico Junker, PT, DPT 07/10/18    9:41 PM    [facility] [address] [place], [place], [address] Phone: [phone]   Fax:  [phone]  Name: MERLIN GOLDEN MRN: [id number] Date of Birth: 12/30/1940

## 2018-07-11 ENCOUNTER — Encounter: Payer: Self-pay | Admitting: Occupational Therapy

## 2018-07-11 ENCOUNTER — Ambulatory Visit: Payer: Medicare Other | Admitting: Occupational Therapy

## 2018-07-11 DIAGNOSIS — M25612 Stiffness of left shoulder, not elsewhere classified: Secondary | ICD-10-CM

## 2018-07-11 DIAGNOSIS — M6281 Muscle weakness (generalized): Secondary | ICD-10-CM

## 2018-07-11 DIAGNOSIS — R29818 Other symptoms and signs involving the nervous system: Secondary | ICD-10-CM

## 2018-07-11 DIAGNOSIS — R293 Abnormal posture: Secondary | ICD-10-CM

## 2018-07-11 DIAGNOSIS — R2681 Unsteadiness on feet: Secondary | ICD-10-CM

## 2018-07-11 DIAGNOSIS — R29898 Other symptoms and signs involving the musculoskeletal system: Secondary | ICD-10-CM

## 2018-07-11 DIAGNOSIS — R278 Other lack of coordination: Secondary | ICD-10-CM

## 2018-07-11 NOTE — Therapy (Signed)
[facility] [address] [place], [place], [address] Phone: [phone]   Fax:  [phone]  Occupational Therapy Treatment  Patient Details  Name: [name] MRN: [id number] Date of Birth: [birth year] No data recorded  Encounter Date: 07/11/2018  OT End of Session - 07/11/18 1622    Visit Number  18    Number of Visits  27    Date for OT Re-Evaluation  08/14/18    Authorization Type  coverage changed in January to [facility] Medicare    Authorization Time Period  90    Authorization - Visit Number  18    Authorization - Number of Visits  20    OT Start Time  8016    OT Stop Time  1358    OT Time Calculation (min)  43 min    Activity Tolerance  Patient tolerated treatment well    Behavior During Therapy  WFL for tasks assessed/performed       Past Medical History:  Diagnosis Date  . Arthritis   . Barrett esophagus   . Bronchitis   . CAD (coronary artery disease)    pt's wife denies any cardiac history on pt.  . Cancer ([place])    thyroid  . Carotid artery occlusion   . Constipation   . COPD (chronic obstructive pulmonary disease) ([place])   . Diabetes mellitus without complication ([place])   . Dyspnea   . GERD (gastroesophageal reflux disease)   . Headache    migraines  . History of thyroid cancer 2008  . History of thyroid cancer   . Hypertension   . OSA (obstructive sleep apnea)   . Pneumonia   . Restless leg   . S/P tendon repair    left thumb  . Sleep apnea with use of continuous positive airway pressure (CPAP)    [facility] , AHI  77cn central and obstrcutive. 16 cm water , 3 cm EPR.   . Thoracic ascending aortic aneurysm (HCC)    4.2 cm ascending TAA 08/18/17 CTA. Annual imaging recommended.  . Thyroid disease   . Ulcer    Peptic ulcer disease    Past Surgical History:  Procedure Laterality Date  . ANTERIOR CERVICAL DECOMP/DISCECTOMY FUSION N/A 09/03/2017   Procedure: ACDF - C3-C4 - C4-C5 -  C5-C6;  Surgeon: Kary Kos, MD;  Location: [facility];  Service: Neurosurgery;  Laterality: N/A;  . CAROTID ENDARTERECTOMY Left January 03, 2006   Dr. Amedeo Plenty  . COLONOSCOPY    . EYE SURGERY Bilateral    cataract surgery with lens implants  . game keepers thumb Right   . JOINT REPLACEMENT Left    knee X 2  . ROTATOR CUFF REPAIR Right   . THYROIDECTOMY  2008    There were no vitals filed for this visit.  Subjective Assessment - 07/11/18 1403    Patient is accompained by:  Family member    Pertinent History  ACDF C3- C6 09/03/2017. Pt began with LE weakness 05/2017.   Pt has been wheelchair bound starting in 06/2017. Pt in SNF for a month, then went home.  Pt then had ACDF C3- C6 09/03/2017 pt then d/c to SNF until 11/29/2017 for PT, OT and ST.  Pt discharged home with HHPT and OT which ended right before pt started with PT here in this clinic.  PMH:    Patient Stated Goals  get my arms working better and move better    Currently in Pain?  Yes    Pain Score  3     Pain Location  Shoulder    Pain Orientation  Left                   OT Treatments/Exercises (OP) - 07/11/18 0001      ADLs   Cooking  Patient walked into kitchen with RW.  Patient needed no assistance to transition from sit to stand.  Walked to stovetop to [employer].  Necessary  food items in reach, palced stool behind patient for seated rest.  Patient able to turn on stool to obtain skillet, spatula, butter knife from nearby drawers/cabinets.  Overall goal of this activity was to free two hands from support briefly but simultaneously.  Patient very fearful of this, and unable to release two arms in real task - stove top on. Patient able to stand from stool, turn around and move stool out of the way while up on his feet.  Patient has exact stool at home and wife is hopeful he can return to some cooking.                 OT Short Term Goals - 07/03/18 1431      OT SHORT TERM GOAL #6   Title  Pt will demonstate  improved grip strength by at least 5 pounds total to assist with ADL tasks. (baseline L= 45, R =38)    Status  Not Met        OT Long Term Goals - 07/03/18 1431      OT LONG TERM GOAL #1   Title  Pt and wife will be mod I with upgraded home activities program - 05/21/2018    Status  On-going      OT LONG TERM GOAL #2   Title  Pt will demonstrate ability to comb hair mod I    Status  Achieved      OT LONG TERM GOAL #3   Title  Pt will be min a for simple hot familiar meal prep    Status  On-going      OT LONG TERM GOAL #4   Title  Pt will be mod I using reacher to don pants over feet.     Status  Achieved      OT LONG TERM GOAL #5   Title  Pt will demonstrate improved grip strength by at least 7 pounds total to assist with simple cooking tasks (baseline L= 45, R = 38).    Status  Achieved      OT LONG TERM GOAL #6   Title  Pt will verbalize understanding of adapted techniques for buttoning and zipping prn    Status  Achieved      OT LONG TERM GOAL #7   Title  Pt will be able to lift light weight object at mid reach during simple cooking tasks with LUE    Status  Achieved      OT LONG TERM GOAL #8   Title  Pt will be able to lift 2 pound object bilaterally from mid reach to assist with functional activities at home.     Status  On-going            Plan - 07/11/18 1622    Clinical Impression Statement  Patient continues to show improved functional mobility, hich should help independence with ADL    Occupational Profile and client history currently impacting functional performance  husband, friend, reitiree.  PMH:  CAD, DM, COPD, hypoxic respiratory failure, HTN    Occupational performance deficits (Please refer to evaluation for details):  ADL's;IADL's;Leisure;Social Participation;Rest and Sleep    Rehab Potential  Fair    Current Impairments/barriers affecting progress:  length of time since onset, severity of deficits    OT Frequency  2x / week    OT Duration  6  weeks    OT Treatment/Interventions  Self-care/ADL training;Moist Heat;Therapeutic exercise;Neuromuscular education;DME and/or AE instruction;Passive range of motion;Manual Therapy;Therapist, nutritional;Therapeutic activities;Patient/family education;Balance training    Plan  Stand balance, stand tolerance, free UE from support - dressing tasks    Clinical Decision Making  Multiple treatment options, significant modification of task necessary    Consulted and Agree with Plan of Care  Patient;Family member/caregiver    Family Member Consulted  wife       Patient will benefit from skilled therapeutic intervention in order to improve the following deficits and impairments:     Visit Diagnosis: Abnormal posture  Unsteadiness on feet  Muscle weakness (generalized)  Other symptoms and signs involving the musculoskeletal system  Other symptoms and signs involving the nervous system  Other lack of coordination  Stiffness of left shoulder, not elsewhere classified    Problem List Patient Active Problem List   Diagnosis Date Noted  . Postural urinary incontinence 05/20/2018  . 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 Myoclonic jerking 05/20/2018  . Myelopathy ([place]) 09/03/2017  . Acute respiratory failure with hypoxemia ([place]) 08/18/2017  . Hypokalemia 08/18/2017  . Leukocytosis 08/18/2017  . Muscle atrophy of lower extremity 08/09/2017  . Bowel and bladder incontinence 08/09/2017  . Right-sided muscle weakness 08/09/2017  . Neuropathy 08/09/2017  . 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/2017  . Abnormality of gait 07/12/2017  . Myoclonic jerkings, 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/2017  . Acquired right foot drop 07/12/2017  . UTI (urinary tract infection) due to Enterococcus 07/12/2017  . Pressure injury of skin 06/21/2017  . Acute cystitis 06/20/2017  . Generalized weakness 06/20/2017  . Dehydration 06/20/2017  .  Dyspnea 12/26/2016  . Chronic cough 12/26/2016  . Hypersomnia with sleep apnea 10/30/2016  . CSA (central sleep apnea) 10/30/2016  . Wheezing symptom 10/30/2016  . CPAP/BiPAP dependent 07/01/2015  . 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 RLS (restless legs syndrome) 08/25/2014  . Primary gout 08/25/2014  . OSA on CPAP 08/25/2014  . Severe obesity (BMI >= 40) ([place]) 08/25/2014  . Obesity (BMI 30-39.9) 02/18/2013  . Sleep apnea with use of continuous positive airway pressure (CPAP)   . 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 S/P TKR (total knee replacement) 11/29/2012  . History of thyroid cancer     Mariah Milling, OTR/L 07/11/2018, 4:27 PM  [facility] [address] [place], [place], [address] Phone: [phone]   Fax:  [phone]  Name: [name] MRN: [id number] Date of Birth: 1940-11-22

## 2018-07-15 ENCOUNTER — Ambulatory Visit: Payer: Medicare Other | Admitting: Physical Therapy

## 2018-07-15 ENCOUNTER — Encounter: Payer: Self-pay | Admitting: Physical Therapy

## 2018-07-15 ENCOUNTER — Ambulatory Visit: Payer: Medicare Other | Admitting: Occupational Therapy

## 2018-07-15 ENCOUNTER — Encounter: Payer: Self-pay | Admitting: Occupational Therapy

## 2018-07-15 DIAGNOSIS — R29818 Other symptoms and signs involving the nervous system: Secondary | ICD-10-CM

## 2018-07-15 DIAGNOSIS — M6281 Muscle weakness (generalized): Secondary | ICD-10-CM

## 2018-07-15 DIAGNOSIS — R29898 Other symptoms and signs involving the musculoskeletal system: Secondary | ICD-10-CM

## 2018-07-15 DIAGNOSIS — R293 Abnormal posture: Secondary | ICD-10-CM | POA: Diagnosis not present

## 2018-07-15 DIAGNOSIS — R2681 Unsteadiness on feet: Secondary | ICD-10-CM

## 2018-07-15 DIAGNOSIS — R278 Other lack of coordination: Secondary | ICD-10-CM

## 2018-07-15 DIAGNOSIS — M25612 Stiffness of left shoulder, not elsewhere classified: Secondary | ICD-10-CM

## 2018-07-15 DIAGNOSIS — R2689 Other abnormalities of gait and mobility: Secondary | ICD-10-CM

## 2018-07-15 NOTE — Therapy (Signed)
[facility] [address] [place], [place], [address] Phone: [phone]   Fax:  [phone]  Occupational Therapy Treatment  Patient Details  Name: [name] MRN: [id number] Date of Birth: 06-Jul-1940 No data recorded  Encounter Date: 07/15/2018  OT End of Session - 07/15/18 1526    Visit Number  19    Number of Visits  27    Date for OT Re-Evaluation  08/14/18    Authorization Type  coverage changed in January to [facility] Medicare    Authorization Time Period  90    Authorization - Visit Number  19    Authorization - Number of Visits  20    OT Start Time  2505    OT Stop Time  1524    OT Time Calculation (min)  38 min    Activity Tolerance  Patient tolerated treatment well    Behavior During Therapy  WFL for tasks assessed/performed       Past Medical History:  Diagnosis Date  . Arthritis   . Barrett esophagus   . Bronchitis   . CAD (coronary artery disease)    pt's wife denies any cardiac history on pt.  . Cancer ([place])    thyroid  . Carotid artery occlusion   . Constipation   . COPD (chronic obstructive pulmonary disease) ([place])   . Diabetes mellitus without complication ([place])   . Dyspnea   . GERD (gastroesophageal reflux disease)   . Headache    migraines  . History of thyroid cancer 2008  . History of thyroid cancer   . Hypertension   . OSA (obstructive sleep apnea)   . Pneumonia   . Restless leg   . S/P tendon repair    left thumb  . Sleep apnea with use of continuous positive airway pressure (CPAP)    [facility] , AHI  77cn central and obstrcutive. 16 cm water , 3 cm EPR.   . Thoracic ascending aortic aneurysm (HCC)    4.2 cm ascending TAA 08/18/17 CTA. Annual imaging recommended.  . Thyroid disease   . Ulcer    Peptic ulcer disease    Past Surgical History:  Procedure Laterality Date  . ANTERIOR CERVICAL DECOMP/DISCECTOMY FUSION N/A 09/03/2017   Procedure: ACDF - C3-C4 - C4-C5 -  C5-C6;  Surgeon: Kary Kos, MD;  Location: [facility];  Service: Neurosurgery;  Laterality: N/A;  . CAROTID ENDARTERECTOMY Left January 03, 2006   Dr. Amedeo Plenty  . COLONOSCOPY    . EYE SURGERY Bilateral    cataract surgery with lens implants  . game keepers thumb Right   . JOINT REPLACEMENT Left    knee X 2  . ROTATOR CUFF REPAIR Right   . THYROIDECTOMY  2008    There were no vitals filed for this visit.  Subjective Assessment - 07/15/18 1450    Subjective   Patient brought in pictures of shower set up    Patient is accompained by:  Family member    Pertinent History  ACDF C3- C6 09/03/2017. Pt began with LE weakness 05/2017.   Pt has been wheelchair bound starting in 06/2017. Pt in SNF for a month, then went home.  Pt then had ACDF C3- C6 09/03/2017 pt then d/c to SNF until 11/29/2017 for PT, OT and ST.  Pt discharged home with HHPT and OT which ended right before pt started with PT here in this clinic.  PMH:    Patient Stated Goals  get my arms working better and move better    Currently in Pain?  Yes    Pain Score  5     Pain Location  Shoulder    Pain Orientation  Left    Pain Descriptors / Indicators  Sore    Pain Type  Chronic pain    Pain Onset  More than a month ago    Pain Frequency  Constant    Aggravating Factors   weight    Pain Relieving Factors  medication                   OT Treatments/Exercises (OP) - 07/15/18 0001      ADLs   Functional Mobility  Worked on static standing intiially, then very minor weight shifts in standing.  Next worked to free one hand form support, then finally releasing both hands from support to increase activation in LE's.  Patient able to stand without UE support for over one minute.  Patient with multiple transitions from sit to stand and stand to sit this session.  Fatigue evident at end of session.                 OT Short Term Goals - 07/03/18 1431      OT SHORT TERM GOAL #6   Title  Pt will demonstate improved grip  strength by at least 5 pounds total to assist with ADL tasks. (baseline L= 45, R =38)    Status  Not Met        OT Long Term Goals - 07/03/18 1431      OT LONG TERM GOAL #1   Title  Pt and wife will be mod I with upgraded home activities program - 05/21/2018    Status  On-going      OT LONG TERM GOAL #2   Title  Pt will demonstrate ability to comb hair mod I    Status  Achieved      OT LONG TERM GOAL #3   Title  Pt will be min a for simple hot familiar meal prep    Status  On-going      OT LONG TERM GOAL #4   Title  Pt will be mod I using reacher to don pants over feet.     Status  Achieved      OT LONG TERM GOAL #5   Title  Pt will demonstrate improved grip strength by at least 7 pounds total to assist with simple cooking tasks (baseline L= 45, R = 38).    Status  Achieved      OT LONG TERM GOAL #6   Title  Pt will verbalize understanding of adapted techniques for buttoning and zipping prn    Status  Achieved      OT LONG TERM GOAL #7   Title  Pt will be able to lift light weight object at mid reach during simple cooking tasks with LUE    Status  Achieved      OT LONG TERM GOAL #8   Title  Pt will be able to lift 2 pound object bilaterally from mid reach to assist with functional activities at home.     Status  On-going            Plan - 07/15/18 1530    Clinical Impression Statement  Patient continues to show improved stand tolerance, safety and independence with sit to stand.      Occupational Profile and client history  currently impacting functional performance  husband, friend, reitiree.  PMH:  CAD, DM, COPD, hypoxic respiratory failure, HTN    Occupational performance deficits (Please refer to evaluation for details):  ADL's;IADL's;Leisure;Social Participation;Rest and Sleep    Current Impairments/barriers affecting progress:  length of time since onset, severity of deficits    OT Frequency  2x / week    OT Duration  6 weeks    OT Treatment/Interventions   Self-care/ADL training;Moist Heat;Therapeutic exercise;Neuromuscular education;DME and/or AE instruction;Passive range of motion;Manual Therapy;Therapist, nutritional;Therapeutic activities;Patient/family education;Balance training    Plan  pn due - check remaining goals, stand tolerance, stand balance, free UE from support    Clinical Decision Making  Multiple treatment options, significant modification of task necessary    Consulted and Agree with Plan of Care  Patient;Family member/caregiver    Family Member Consulted  wife       Patient will benefit from skilled therapeutic intervention in order to improve the following deficits and impairments:  Decreased activity tolerance, Decreased balance, Decreased coordination, Decreased range of motion, Decreased mobility, Decreased knowledge of use of DME, Decreased strength, Difficulty walking, Impaired UE functional use, Impaired sensation, Impaired flexibility, Pain  Visit Diagnosis: Abnormal posture  Unsteadiness on feet  Muscle weakness (generalized)  Other symptoms and signs involving the musculoskeletal system  Other symptoms and signs involving the nervous system  Other lack of coordination  Stiffness of left shoulder, not elsewhere classified    Problem List Patient Active Problem List   Diagnosis Date Noted  . Postural urinary incontinence 05/20/2018  . 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 Myoclonic jerking 05/20/2018  . Myelopathy ([place]) 09/03/2017  . Acute respiratory failure with hypoxemia ([place]) 08/18/2017  . Hypokalemia 08/18/2017  . Leukocytosis 08/18/2017  . Muscle atrophy of lower extremity 08/09/2017  . Bowel and bladder incontinence 08/09/2017  . Right-sided muscle weakness 08/09/2017  . Neuropathy 08/09/2017  . 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/2017  . Abnormality of gait 07/12/2017  . Myoclonic jerkings, massive  07/12/2017  . Acquired right foot drop 07/12/2017  . UTI (urinary tract infection) due to Enterococcus 07/12/2017  . Pressure injury of skin 06/21/2017  . Acute cystitis 06/20/2017  . Generalized weakness 06/20/2017  . Dehydration 06/20/2017  . Dyspnea 12/26/2016  . Chronic cough 12/26/2016  . Hypersomnia with sleep apnea 10/30/2016  . CSA (central sleep apnea) 10/30/2016  . Wheezing symptom 10/30/2016  . CPAP/BiPAP dependent 07/01/2015  . 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 RLS (restless legs syndrome) 08/25/2014  . Primary gout 08/25/2014  . OSA on CPAP 08/25/2014  . Severe obesity (BMI >= 40) ([place]) 08/25/2014  . Obesity (BMI 30-39.9) 02/18/2013  . Sleep apnea with use of continuous positive airway pressure (CPAP)   . 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 S/P TKR (total knee replacement) 11/29/2012  . History of thyroid cancer     Mariah Milling 07/15/2018, 3:31 PM  [facility] [address] [place], [place], [address] Phone: [phone]   Fax:  [phone]  Name: [name] MRN: [id number] Date of Birth: [birth year]

## 2018-07-15 NOTE — Therapy (Signed)
[facility] [address] [place], [place], [address] Phone: [phone]   Fax:  [phone]  Physical Therapy Treatment and 10th visit Progress Note  Patient Details  Name: [name] MRN: [id number] Date of Birth: [birth year] Referring Provider (PT): Larey Seat, MD   Encounter Date: 07/15/2018  10th Visit Physical Therapy Progress Note  Dates of Reporting Period: 06/05/2018 to 07/15/2018   PT End of Session - 07/15/18 2030    Visit Number  40    Number of Visits  48    Date for PT Re-Evaluation  08/09/18    Authorization Type  [employer] Pt has met $5900 oop, full coverage, 10th visit PN    PT Start Time  1400    PT Stop Time  1443    PT Time Calculation (min)  43 min    Activity Tolerance  Patient tolerated treatment well    Behavior During Therapy  [facility] for tasks assessed/performed       Past Medical History:  Diagnosis Date  . Arthritis   . Barrett esophagus   . Bronchitis   . CAD (coronary artery disease)    pt's wife denies any cardiac history on pt.  . Cancer ([place])    thyroid  . Carotid artery occlusion   . Constipation   . COPD (chronic obstructive pulmonary disease) ([place])   . Diabetes mellitus without complication ([place])   . Dyspnea   . GERD (gastroesophageal reflux disease)   . Headache    migraines  . History of thyroid cancer 2008  . History of thyroid cancer   . Hypertension   . OSA (obstructive sleep apnea)   . Pneumonia   . Restless leg   . S/P tendon repair    left thumb  . Sleep apnea with use of continuous positive airway pressure (CPAP)    2011 [facility] , AHI  77cn central and obstrcutive. 16 cm water , 3 cm EPR.   . Thoracic ascending aortic aneurysm (HCC)    4.2 cm ascending TAA 08/18/17 CTA. Annual imaging recommended.  . Thyroid disease   . Ulcer    Peptic ulcer disease    Past Surgical History:  Procedure Laterality Date  . ANTERIOR CERVICAL  DECOMP/DISCECTOMY FUSION N/A 09/03/2017   Procedure: ACDF - C3-C4 - C4-C5 - C5-C6;  Surgeon: Kary Kos, MD;  Location: [facility];  Service: Neurosurgery;  Laterality: N/A;  . CAROTID ENDARTERECTOMY Left January 03, 2006   Dr. Amedeo Plenty  . COLONOSCOPY    . EYE SURGERY Bilateral    cataract surgery with lens implants  . game keepers thumb Right   . JOINT REPLACEMENT Left    knee X 2  . ROTATOR CUFF REPAIR Right   . THYROIDECTOMY  2008    There were no vitals filed for this visit.  Subjective Assessment - 07/15/18 1404    Subjective  Wife is not feeling well, has had a fever for a few days.  Did not get to practice in the kitchen this weekend.  Did some standing and walking yesterday and today.      Patient is accompained by:  Family member    Pertinent History  CAD, DM2, COPD, hypoxic respiratory failure, HTN,    Patient Stated Goals  To walk in home & community, wife wants him to be able to care for himself if something happens to her    Currently in Pain?  Yes    Pain Score  5     Pain Location  Shoulder    Pain Orientation  Left    Pain Descriptors / Indicators  Sore    Pain Type  Chronic pain    Pain Onset  More than a month ago                       [facility] Adult PT Treatment/Exercise - 07/15/18 1408      Balance   Balance Assessed  Yes      Static Standing Balance   Static Standing - Balance Support  Left upper extremity supported;During functional activity    Static Standing - Level of Assistance  4: Min assist    Static Standing - Comment/# of Minutes  Standing at counter reaching diagonal forwards and to the R at counter level x 6, up and to the R to tall cabinet x 6 and then down and R x 6.  Required cues for trunk elongation and full weight shift to R when reaching.  Also required assistance for RLE stability during R WB.      Knee/Hip Exercises: Standing   Forward Step Up  Right;1 set;Hand Hold: 2;Step Height: 4"   8 reps with min-mod A; assist to prevent  scissoring   Step Down  Right;1 set;Hand Hold: 2;Step Height: 4"   RLE down; 6 reps before LLE fatigue            PT Education - 07/15/18 2029    Education Details  continued to review safe technique for reaching while in standing.  Advised pt to perform side stepping at counter to reach items outside BOS    Person(s) Educated  Patient;Spouse    Methods  Explanation;Demonstration    Comprehension  Verbalized understanding       PT Short Term Goals - 07/10/18 1114      PT SHORT TERM GOAL #1   Title  Patient & wife demonstrate & verbalize understanding of updated HEP. (All updated STGs Target Date: 07/12/2018)    Baseline  MET 07/08/2018    Time  1    Period  Months    Status  Achieved      PT SHORT TERM GOAL #2   Title  Patient performs stand-pivot transfer with RW between 2 chairs with armrests with supervision.     Baseline  MET 07/08/2018    Time  1    Period  Months    Status  Achieved      PT SHORT TERM GOAL #3   Title  Patient ambulates 30' between 2 chairs with RW with wife's assist safely.     Time  1    Period  Months    Status  Achieved      PT SHORT TERM GOAL #4   Title  Patient negotiates stairs with 2 rails step-to with minA.     Time  1    Period  Months    Status  Achieved      PT SHORT TERM GOAL #5   Title  Patient negotiates curb with RW with modA.     Baseline  MET 07/08/2018    Time  1    Period  Months    Status  Achieved        PT Long Term Goals - 07/10/18 2135      PT LONG TERM GOAL #1   Title  Patient & wife demonstrate & verbalize understanding of ongoing exercise / fitness plan   including community programs. (All LTGs 08/09/2018)    Time  3    Period  Months    Status  On-going      PT LONG TERM GOAL #2   Title  Consistent stand pivot transfer w/c < > bed/mat with RW and MOD I    Time  3    Period  Months    Status  Revised      PT LONG TERM GOAL #3   Title  Patient and wife verbalize understanding of recommendations to  manage spasms / hypertonic motions.     Time  3    Period  Months    Status  New      PT LONG TERM GOAL #4   Title  Sit to/from stand chairs with armrests to RW MOD I    Time  3    Period  Months    Status  Revised      PT LONG TERM GOAL #5   Title  Patient standing balance with one UE support x 5-8 minutes while scanning environment and reaching outside BOS (high, low, laterally) with supervision    Time  3    Period  Months    Status  Revised      PT LONG TERM GOAL #6   Title  Patient ambulates 115' over indoor surfaces and 25' outside over pavement with RW with minA.     Time  3    Period  Months    Status  Revised            Plan - 07/15/18 1442    Clinical Impression Statement  Continued to focus on safe technique for reaching into high and low cabinets as well as reaching for items at counter top level.  Continued to required verbal and tactile cues for weight shifting and loading of RLE when reaching to R to prevent flexion moment in RLE.  Pt required mod-max to maintain balance x 2 episodes of LOB and RLE "buckling" when reaching to R.  Advised pt to perform side stepping to get closer to item instead of reaching too far outside BOS.  Continued LE strengthening for stair negotiation with step up and step downs.  Pt fatigued quickly and requested to end session a few minutes early before transition to OT.  Will continue to address and progress towards LTG.    Rehab Potential  Good    Clinical Impairments Affecting Rehab Potential  weakness & ROM impaired 4 extremities & trunk; dependency transfers, bed mobility, standing & gait    PT Frequency  2x / week    PT Duration  Other (comment)   13 weeks, 90 days   PT Treatment/Interventions  ADLs/Self Care Home Management;Canalith Repostioning;Moist Heat;DME Instruction;Gait training;Stair training;Functional mobility training;Therapeutic activities;Therapeutic exercise;Balance training;Neuromuscular re-education;Patient/family  education;Manual techniques;Passive range of motion;Vestibular    PT Next Visit Plan  Continue to review activities that pt can do at home without wife there; review reaching up high, down low into cabinets and retrieving items safely, side stepping along counter top to reach items outside BOS.  stair negotiation - step ups and step downs increasing height of step, increasing distance with gait with RW.      PT Home Exercise Plan  Access Code: [email or web address]     Consulted and Agree with Plan of Care  Patient;Family member/caregiver    Family Member Consulted  wife       Patient will benefit from skilled therapeutic intervention in order to  improve the following deficits and impairments:  Abnormal gait, Cardiopulmonary status limiting activity, Decreased activity tolerance, Decreased balance, Decreased endurance, Decreased knowledge of use of DME, Decreased mobility, Decreased range of motion, Decreased strength, Difficulty walking, Dizziness, Impaired flexibility, Impaired UE functional use, Postural dysfunction  Visit Diagnosis: Abnormal posture  Unsteadiness on feet  Other abnormalities of gait and mobility     Problem List Patient Active Problem List   Diagnosis Date Noted  . Postural urinary incontinence 05/20/2018  . 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 Myoclonic jerking 05/20/2018  . Myelopathy ([place]) 09/03/2017  . Acute respiratory failure with hypoxemia ([place]) 08/18/2017  . Hypokalemia 08/18/2017  . Leukocytosis 08/18/2017  . Muscle atrophy of lower extremity 08/09/2017  . Bowel and bladder incontinence 08/09/2017  . Right-sided muscle weakness 08/09/2017  . Neuropathy 08/09/2017  . 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/2017  . Abnormality of gait 07/12/2017  . Myoclonic jerkings, 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/2017  . Acquired right foot drop 07/12/2017  . UTI (urinary tract infection) due to Enterococcus 07/12/2017   . Pressure injury of skin 06/21/2017  . Acute cystitis 06/20/2017  . Generalized weakness 06/20/2017  . Dehydration 06/20/2017  . Dyspnea 12/26/2016  . Chronic cough 12/26/2016  . Hypersomnia with sleep apnea 10/30/2016  . CSA (central sleep apnea) 10/30/2016  . Wheezing symptom 10/30/2016  . CPAP/BiPAP dependent 07/01/2015  . 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 RLS (restless legs syndrome) 08/25/2014  . Primary gout 08/25/2014  . OSA on CPAP 08/25/2014  . Severe obesity (BMI >= 40) ([place]) 08/25/2014  . Obesity (BMI 30-39.9) 02/18/2013  . Sleep apnea with use of continuous positive airway pressure (CPAP)   . 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 S/P TKR (total knee replacement) 11/29/2012  . History of thyroid cancer    Rico Junker, PT, DPT 07/15/18    8:38 PM    [facility] [address] [place], [place], [address] Phone: [phone]   Fax:  [phone]  Name: [name] MRN: [id number] Date of Birth: 02-Sep-1940

## 2018-07-17 ENCOUNTER — Encounter: Payer: Self-pay | Admitting: Occupational Therapy

## 2018-07-17 ENCOUNTER — Ambulatory Visit: Payer: Medicare Other | Admitting: Physical Therapy

## 2018-07-17 ENCOUNTER — Ambulatory Visit: Payer: Medicare Other | Admitting: Occupational Therapy

## 2018-07-17 DIAGNOSIS — R293 Abnormal posture: Secondary | ICD-10-CM

## 2018-07-17 DIAGNOSIS — M6281 Muscle weakness (generalized): Secondary | ICD-10-CM

## 2018-07-17 DIAGNOSIS — R29818 Other symptoms and signs involving the nervous system: Secondary | ICD-10-CM

## 2018-07-17 DIAGNOSIS — R278 Other lack of coordination: Secondary | ICD-10-CM

## 2018-07-17 DIAGNOSIS — R2681 Unsteadiness on feet: Secondary | ICD-10-CM

## 2018-07-17 DIAGNOSIS — R29898 Other symptoms and signs involving the musculoskeletal system: Secondary | ICD-10-CM

## 2018-07-17 DIAGNOSIS — M25612 Stiffness of left shoulder, not elsewhere classified: Secondary | ICD-10-CM

## 2018-07-17 NOTE — Therapy (Signed)
[facility] [address] [place], [place], [address] Phone: [phone]   Fax:  [phone]  Occupational Therapy Treatment  Patient Details  Name: [name] MRN: [id number] Date of Birth: [birth year] No data recorded  Encounter Date: 07/17/2018  OT End of Session - 07/17/18 1230    Visit Number  20    Number of Visits  27    Date for OT Re-Evaluation  08/14/18    Authorization Type  coverage changed in January to [facility] Medicare    Authorization Time Period  90    Authorization - Visit Number  20    Authorization - Number of Visits  20    OT Start Time  1102    OT Stop Time  1135    OT Time Calculation (min)  33 min    Activity Tolerance  Patient tolerated treatment well       Past Medical History:  Diagnosis Date  . Arthritis   . Barrett esophagus   . Bronchitis   . CAD (coronary artery disease)    pt's wife denies any cardiac history on pt.  . Cancer ([place])    thyroid  . Carotid artery occlusion   . Constipation   . COPD (chronic obstructive pulmonary disease) ([place])   . Diabetes mellitus without complication ([place])   . Dyspnea   . GERD (gastroesophageal reflux disease)   . Headache    migraines  . History of thyroid cancer 2008  . History of thyroid cancer   . Hypertension   . OSA (obstructive sleep apnea)   . Pneumonia   . Restless leg   . S/P tendon repair    left thumb  . Sleep apnea with use of continuous positive airway pressure (CPAP)    [facility] , AHI  77cn central and obstrcutive. 16 cm water , 3 cm EPR.   . Thoracic ascending aortic aneurysm (HCC)    4.2 cm ascending TAA 08/18/17 CTA. Annual imaging recommended.  . Thyroid disease   . Ulcer    Peptic ulcer disease    Past Surgical History:  Procedure Laterality Date  . ANTERIOR CERVICAL DECOMP/DISCECTOMY FUSION N/A 09/03/2017   Procedure: ACDF - C3-C4 - C4-C5 - C5-C6;  Surgeon: Kary Kos, MD;  Location: [facility];  Service:  Neurosurgery;  Laterality: N/A;  . CAROTID ENDARTERECTOMY Left January 03, 2006   Dr. Amedeo Plenty  . COLONOSCOPY    . EYE SURGERY Bilateral    cataract surgery with lens implants  . game keepers thumb Right   . JOINT REPLACEMENT Left    knee X 2  . ROTATOR CUFF REPAIR Right   . THYROIDECTOMY  2008    There were no vitals filed for this visit.  Subjective Assessment - 07/17/18 1108    Subjective   The main thing I want to work on is walking at this point.     Patient is accompained by:  Family member   wife   Pertinent History  ACDF C3- C6 09/03/2017. Pt began with LE weakness 05/2017.   Pt has been wheelchair bound starting in 06/2017. Pt in SNF for a month, then went home.  Pt then had ACDF C3- C6 09/03/2017 pt then d/c to SNF until 11/29/2017 for PT, OT and ST.  Pt discharged home with HHPT and OT which ended right before pt started with PT here in this clinic.  PMH:    Patient Stated Goals  get my arms  working better and move better    Currently in Pain?  Yes    Pain Score  4     Pain Location  Shoulder    Pain Orientation  Left    Pain Descriptors / Indicators  Sore    Pain Type  Chronic pain    Pain Onset  More than a month ago    Pain Frequency  Constant    Aggravating Factors   weight    Pain Relieving Factors  medication                   OT Treatments/Exercises (OP) - 07/17/18 0001      ADLs   ADL Comments  Checked remaining goals - see goals for updates.  Pt has met all goals.  Long discussion regarding pt''s goals at this time.  Pt's two goals are to be able to go from sit to stand from his bed first thing in the morning and to be able to walk better.  Given current status and pt's goals, will d/c from OT today.  Pt and wife in agreement. Reviewed progress toward goals and also reviewed in detail HEP for pt (includes exercises, ADL activities and activties to focus on sit to stand, standing, balance and activity tolerance.  Pt and wife verbalized understanding.               OT Education - 07/17/18 1227    Education Details  reviewed progress, POC and home activties program.     Person(s) Educated  Patient;Spouse    Methods  Explanation;Demonstration    Comprehension  Verbalized understanding;Returned demonstration       OT Short Term Goals - 07/17/18 1129      OT SHORT TERM GOAL #1   Title  Pt and wife will be mod I with UE home activities program focused on UE strenthening, coordination and UE functional use (BUE's) - 04/23/2018    Status  Achieved      OT SHORT TERM GOAL #2   Title  Pt will demonstrate ability for active L shoulder flexion in supine to 125* without pain in prep for overhead functional reach.     Status  Achieved      OT SHORT TERM GOAL #3   Title  Pt will demonstrate ability to pick up light weight object with LUE at mid reach (75* of shoulder flexion).      Status  Achieved      OT SHORT TERM GOAL #4   Title  Pt will demonstrate ability to use reacher to don pants over feet in sitting with min a.    Status  Achieved      OT SHORT TERM GOAL #5   Title  Patient will demonstrate improved coordination as evidenced by reduced time on 9 hole peg test by 10 seconds each hand - to improve ability to manipulate small objects - pills, buttons, etc.      Baseline  right 1 min 19 sec (10/16)   left 58 sec (10/16)    Status  Achieved      OT SHORT TERM GOAL #6   Title  Pt will demonstate improved grip strength by at least 5 pounds total to assist with ADL tasks. (baseline L= 45, R =38)    Status  Achieved        OT Long Term Goals - 07/17/18 1129      OT LONG TERM GOAL #1   Title  Pt and  wife will be mod I with upgraded home activities program - 05/21/2018    Status  Achieved      OT LONG TERM GOAL #2   Title  Pt will demonstrate ability to comb hair mod I    Status  Achieved      OT LONG TERM GOAL #3   Title  Pt will be min a for simple hot familiar meal prep    Status  Achieved      OT LONG TERM GOAL #4    Title  Pt will be mod I using reacher to don pants over feet.     Status  Achieved      OT LONG TERM GOAL #5   Title  Pt will demonstrate improved grip strength by at least 7 pounds total to assist with simple cooking tasks (baseline L= 45, R = 38).    Status  Achieved   07/17/2018 R= 48, L =65     OT LONG TERM GOAL #6   Title  Pt will verbalize understanding of adapted techniques for buttoning and zipping prn    Status  Achieved      OT LONG TERM GOAL #7   Title  Pt will be able to lift light weight object at mid reach during simple cooking tasks with LUE    Status  Achieved      OT LONG TERM GOAL #8   Title  Pt will be able to lift 2 pound object bilaterally from mid reach to assist with functional activities at home.     Status  Achieved   able to lift 6 pound object           Plan - 07/17/18 1229    Clinical Impression Statement  Pt has met all goals for OT and is ready to d/c.  Pt and wife in agreement.     Occupational Profile and client history currently impacting functional performance  husband, friend, reitiree.  PMH:  CAD, DM, COPD, hypoxic respiratory failure, HTN    Occupational performance deficits (Please refer to evaluation for details):  ADL's;IADL's;Leisure;Social Participation;Rest and Sleep    Rehab Potential  Fair    Current Impairments/barriers affecting progress:  length of time since onset, severity of deficits    OT Frequency  2x / week    OT Duration  6 weeks    OT Treatment/Interventions  Self-care/ADL training;Moist Heat;Therapeutic exercise;Neuromuscular education;DME and/or AE instruction;Passive range of motion;Manual Therapy;Therapist, nutritional;Therapeutic activities;Patient/family education;Balance training    Plan  d/c from [facility] and Agree with Plan of Care  Patient;Family member/caregiver    Family Member Consulted  wife       Patient will benefit from skilled therapeutic intervention in order to improve the following  deficits and impairments:  Decreased activity tolerance, Decreased balance, Decreased coordination, Decreased range of motion, Decreased mobility, Decreased knowledge of use of DME, Decreased strength, Difficulty walking, Impaired UE functional use, Impaired sensation, Impaired flexibility, Pain  Visit Diagnosis: Abnormal posture  Unsteadiness on feet  Muscle weakness (generalized)  Other symptoms and signs involving the musculoskeletal system  Other symptoms and signs involving the nervous system  Other lack of coordination  Stiffness of left shoulder, not elsewhere classified    Problem List Patient Active Problem List   Diagnosis Date Noted  . Postural urinary incontinence 05/20/2018  . 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 Myoclonic jerking 05/20/2018  . Myelopathy ([place])  09/03/2017  . Acute respiratory failure with hypoxemia ([place]) 08/18/2017  . Hypokalemia 08/18/2017  . Leukocytosis 08/18/2017  . Muscle atrophy of lower extremity 08/09/2017  . Bowel and bladder incontinence 08/09/2017  . Right-sided muscle weakness 08/09/2017  . Neuropathy 08/09/2017  . 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/2017  . Abnormality of gait 07/12/2017  . Myoclonic jerkings, 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/2017  . Acquired right foot drop 07/12/2017  . UTI (urinary tract infection) due to Enterococcus 07/12/2017  . Pressure injury of skin 06/21/2017  . Acute cystitis 06/20/2017  . Generalized weakness 06/20/2017  . Dehydration 06/20/2017  . Dyspnea 12/26/2016  . Chronic cough 12/26/2016  . Hypersomnia with sleep apnea 10/30/2016  . CSA (central sleep apnea) 10/30/2016  . Wheezing symptom 10/30/2016  . CPAP/BiPAP dependent 07/01/2015  . 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 RLS (restless legs syndrome) 08/25/2014  . Primary gout 08/25/2014  . OSA on CPAP 08/25/2014  . Severe obesity (BMI >= 40) ([place]) 08/25/2014  . Obesity  (BMI 30-39.9) 02/18/2013  . Sleep apnea with use of continuous positive airway pressure (CPAP)   . 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 S/P TKR (total knee replacement) 11/29/2012  . History of thyroid cancer    OCCUPATIONAL THERAPY DISCHARGE SUMMARY  Visits from Start of Care: 20  Current functional level related to goals / functional outcomes: See above   Remaining deficits: See above   Education / Equipment: Extensive home activities program Plan: Patient agrees to discharge.  Patient goals were met. Patient is being discharged due to meeting the stated rehab goals.  ?????     Quay Burow, OTR/L 07/17/2018, 12:31 PM  [facility] [address] [place], [place], [address] Phone: [phone]   Fax:  [phone]  Name: [name] MRN: [id number] Date of Birth: [birth year]

## 2018-07-22 ENCOUNTER — Encounter: Payer: Self-pay | Admitting: Physical Therapy

## 2018-07-22 ENCOUNTER — Ambulatory Visit: Payer: Medicare Other | Attending: Neurology | Admitting: Physical Therapy

## 2018-07-22 ENCOUNTER — Encounter: Payer: Medicare Other | Admitting: Occupational Therapy

## 2018-07-22 DIAGNOSIS — R29898 Other symptoms and signs involving the musculoskeletal system: Secondary | ICD-10-CM

## 2018-07-22 DIAGNOSIS — M6281 Muscle weakness (generalized): Secondary | ICD-10-CM | POA: Insufficient documentation

## 2018-07-22 DIAGNOSIS — R29818 Other symptoms and signs involving the nervous system: Secondary | ICD-10-CM | POA: Diagnosis present

## 2018-07-22 DIAGNOSIS — R2689 Other abnormalities of gait and mobility: Secondary | ICD-10-CM | POA: Insufficient documentation

## 2018-07-22 DIAGNOSIS — R293 Abnormal posture: Secondary | ICD-10-CM

## 2018-07-22 DIAGNOSIS — R2681 Unsteadiness on feet: Secondary | ICD-10-CM | POA: Diagnosis present

## 2018-07-23 NOTE — Therapy (Signed)
[facility] [address] [place], [place], [address] Phone: [phone]   Fax:  [phone]  Physical Therapy Treatment  Patient Details  Name: [name] MRN: [id number] Date of Birth: 08-28-1940 Referring Provider (PT): Larey Seat, MD   Encounter Date: 07/22/2018  PT End of Session - 07/22/18 1906    Visit Number  41    Number of Visits  48    Date for PT Re-Evaluation  08/09/18    Authorization Type  [employer] Pt has met $5900 oop, full coverage, 10th visit PN    PT Start Time  1230    PT Stop Time  1315    PT Time Calculation (min)  45 min    Activity Tolerance  Patient tolerated treatment well;Patient limited by fatigue    Behavior During Therapy  [facility] for tasks assessed/performed       Past Medical History:  Diagnosis Date  . Arthritis   . Barrett esophagus   . Bronchitis   . CAD (coronary artery disease)    pt's wife denies any cardiac history on pt.  . Cancer ([place])    thyroid  . Carotid artery occlusion   . Constipation   . COPD (chronic obstructive pulmonary disease) ([place])   . Diabetes mellitus without complication ([place])   . Dyspnea   . GERD (gastroesophageal reflux disease)   . Headache    migraines  . History of thyroid cancer 2008  . History of thyroid cancer   . Hypertension   . OSA (obstructive sleep apnea)   . Pneumonia   . Restless leg   . S/P tendon repair    left thumb  . Sleep apnea with use of continuous positive airway pressure (CPAP)    [facility] , AHI  77cn central and obstrcutive. 16 cm water , 3 cm EPR.   . Thoracic ascending aortic aneurysm (HCC)    4.2 cm ascending TAA 08/18/17 CTA. Annual imaging recommended.  . Thyroid disease   . Ulcer    Peptic ulcer disease    Past Surgical History:  Procedure Laterality Date  . ANTERIOR CERVICAL DECOMP/DISCECTOMY FUSION N/A 09/03/2017   Procedure: ACDF - C3-C4 - C4-C5 - C5-C6;  Surgeon: Kary Kos, MD;   Location: [facility];  Service: Neurosurgery;  Laterality: N/A;  . CAROTID ENDARTERECTOMY Left January 03, 2006   Dr. Amedeo Plenty  . COLONOSCOPY    . EYE SURGERY Bilateral    cataract surgery with lens implants  . game keepers thumb Right   . JOINT REPLACEMENT Left    knee X 2  . ROTATOR CUFF REPAIR Right   . THYROIDECTOMY  2008    There were no vitals filed for this visit.  Subjective Assessment - 07/22/18 1230    Subjective  He is walking from bathroom to computer more often. He is reminding himself to stand up. He is managing briefs more himself.     Patient is accompained by:  Family member    Pertinent History  CAD, DM2, COPD, hypoxic respiratory failure, HTN,    Patient Stated Goals  To walk in home & community, wife wants him to be able to care for himself if something happens to her    Currently in Pain?  Yes    Pain Score  2    morning was 7/10 but took meds prior to coming to PT    Pain Location  Shoulder    Pain Orientation  Left  Pain Descriptors / Indicators  Sore    Pain Type  Chronic pain    Pain Onset  More than a month ago    Pain Frequency  Constant    Aggravating Factors   weight bearing     Pain Relieving Factors  medication                       [facility] Adult PT Treatment/Exercise - 07/22/18 1230      Transfers   Transfers  Sit to Stand;Stand to [employer] Transfers    Sit to Stand  5: Supervision;With upper extremity assist;With armrests;From chair/3-in-1   to RW   Stand to Sit  5: Supervision;With upper extremity assist;With armrests;To chair/3-in-1   from [employer] Transfers  5: Supervision;With armrests   with RW     Ambulation/Gait   Ambulation/Gait  Yes    Ambulation/Gait Assistance  5: Supervision;4: Min guard    Ambulation/Gait Assistance Details  verbal cues on upright posture and wt shift    Ambulation Distance (Feet)  75 Feet   15' & 75'   Assistive device  Rolling walker    Stairs  Yes    Stairs Assistance  4: Min  assist    Stairs Assistance Details (indicate cue type and reason)  worked on sidestepping with [place] on single rail. PT demo & verbally instructed technique and manual/verbal cues with activity. He required rest after each set/ dirction.     Stair Management Technique  Two rails;Step to pattern;Forwards;One rail Right;One rail Left;Sideways    Number of Stairs  4   1 set BUEs forward, 1 set 3 steps left rail & 1 set rt rail   Height of Stairs  6             PT Education - 07/22/18 1909    Education Details  wife & pt to work on short distance (~10') gait with RW without CNA in home.     Person(s) Educated  Patient;Spouse    Methods  Explanation;Verbal cues    Comprehension  Verbalized understanding       PT Short Term Goals - 07/10/18 1114      PT SHORT TERM GOAL #1   Title  Patient & wife demonstrate & verbalize understanding of updated HEP. (All updated STGs Target Date: 07/12/2018)    Baseline  MET 07/08/2018    Time  1    Period  Months    Status  Achieved      PT SHORT TERM GOAL #2   Title  Patient performs stand-pivot transfer with RW between 2 chairs with armrests with supervision.     Baseline  MET 07/08/2018    Time  1    Period  Months    Status  Achieved      PT SHORT TERM GOAL #3   Title  Patient ambulates 48' between 2 chairs with RW with wife's assist safely.     Time  1    Period  Months    Status  Achieved      PT SHORT TERM GOAL #4   Title  Patient negotiates stairs with 2 rails step-to with minA.     Time  1    Period  Months    Status  Achieved      PT SHORT TERM GOAL #5   Title  Patient negotiates curb with RW with modA.     Baseline  MET 07/08/2018  Time  1    Period  Months    Status  Achieved        PT Long Term Goals - 07/10/18 2135      PT LONG TERM GOAL #1   Title  Patient & wife demonstrate & verbalize understanding of ongoing exercise / fitness plan including community programs. (All LTGs 08/09/2018)    Time  3    Period   Months    Status  On-going      PT LONG TERM GOAL #2   Title  Consistent stand pivot transfer w/c < > bed/mat with RW and MOD I    Time  3    Period  Months    Status  Revised      PT LONG TERM GOAL #3   Title  Patient and wife verbalize understanding of recommendations to manage spasms / hypertonic motions.     Time  3    Period  Months    Status  New      PT LONG TERM GOAL #4   Title  Sit to/from stand chairs with armrests to RW MOD I    Time  3    Period  Months    Status  Revised      PT LONG TERM GOAL #5   Title  Patient standing balance with one UE support x 5-8 minutes while scanning environment and reaching outside BOS (high, low, laterally) with supervision    Time  3    Period  Months    Status  Revised      PT LONG TERM GOAL #6   Title  Patient ambulates 115' over indoor surfaces and 25' outside over pavement with RW with minA.     Time  3    Period  Months    Status  Revised            Plan - 07/22/18 1906    Clinical Impression Statement  PT added ascending /descending steps sideways with BUEs on single rail to improve function with ability to access more places and work on hip strength /control with lateral steps.  Pt will need additional work with PT prior to performing outside of PT. He improved distance & foot clearance with level surface gait.     Rehab Potential  Good    Clinical Impairments Affecting Rehab Potential  weakness & ROM impaired 4 extremities & trunk; dependency transfers, bed mobility, standing & gait    PT Frequency  2x / week    PT Duration  Other (comment)   13 weeks, 90 days   PT Treatment/Interventions  ADLs/Self Care Home Management;Canalith Repostioning;Moist Heat;DME Instruction;Gait training;Stair training;Functional mobility training;Therapeutic activities;Therapeutic exercise;Balance training;Neuromuscular re-education;Patient/family education;Manual techniques;Passive range of motion;Vestibular    PT Next Visit Plan   Continue to review activities that pt can do at home without wife there; review reaching up high, down low into cabinets and retrieving items safely, side stepping along counter top to reach items outside BOS.  stair negotiation - step ups and step downs increasing height of step, increasing distance with gait with RW.      PT Home Exercise Plan  Access Code: [email or web address]     Consulted and Agree with Plan of Care  Patient;Family member/caregiver    Family Member Consulted  wife       Patient will benefit from skilled therapeutic intervention in order to improve the following deficits and impairments:  Abnormal gait, Cardiopulmonary status limiting activity, Decreased activity  tolerance, Decreased balance, Decreased endurance, Decreased knowledge of use of DME, Decreased mobility, Decreased range of motion, Decreased strength, Difficulty walking, Dizziness, Impaired flexibility, Impaired UE functional use, Postural dysfunction  Visit Diagnosis: Abnormal posture  Unsteadiness on feet  Muscle weakness (generalized)  Other symptoms and signs involving the musculoskeletal system  Other symptoms and signs involving the nervous system  Other abnormalities of gait and mobility     Problem List Patient Active Problem List   Diagnosis Date Noted  . Postural urinary incontinence 05/20/2018  . 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 Myoclonic jerking 05/20/2018  . Myelopathy ([place]) 09/03/2017  . Acute respiratory failure with hypoxemia ([place]) 08/18/2017  . Hypokalemia 08/18/2017  . Leukocytosis 08/18/2017  . Muscle atrophy of lower extremity 08/09/2017  . Bowel and bladder incontinence 08/09/2017  . Right-sided muscle weakness 08/09/2017  . Neuropathy 08/09/2017  . 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/2017  . Abnormality of gait 07/12/2017  . Myoclonic jerkings, 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/2017  . Acquired right foot drop  07/12/2017  . UTI (urinary tract infection) due to Enterococcus 07/12/2017  . Pressure injury of skin 06/21/2017  . Acute cystitis 06/20/2017  . Generalized weakness 06/20/2017  . Dehydration 06/20/2017  . Dyspnea 12/26/2016  . Chronic cough 12/26/2016  . Hypersomnia with sleep apnea 10/30/2016  . CSA (central sleep apnea) 10/30/2016  . Wheezing symptom 10/30/2016  . CPAP/BiPAP dependent 07/01/2015  . 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 RLS (restless legs syndrome) 08/25/2014  . Primary gout 08/25/2014  . OSA on CPAP 08/25/2014  . Severe obesity (BMI >= 40) ([place]) 08/25/2014  . Obesity (BMI 30-39.9) 02/18/2013  . Sleep apnea with use of continuous positive airway pressure (CPAP)   . 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 S/P TKR (total knee replacement) 11/29/2012  . History of thyroid cancer     Jazyah Butsch PT, DPT 07/23/2018, 9:15 AM  [facility] [address] [place], [place], [address] Phone: [phone]   Fax:  [phone]  Name: TYMERE DEPUY MRN: [id number] Date of Birth: 05-29-1941

## 2018-07-24 ENCOUNTER — Ambulatory Visit: Payer: Medicare Other | Admitting: Physical Therapy

## 2018-07-24 ENCOUNTER — Encounter: Payer: Medicare Other | Admitting: Occupational Therapy

## 2018-07-24 ENCOUNTER — Encounter: Payer: Self-pay | Admitting: Physical Therapy

## 2018-07-24 DIAGNOSIS — R29818 Other symptoms and signs involving the nervous system: Secondary | ICD-10-CM

## 2018-07-24 DIAGNOSIS — R2681 Unsteadiness on feet: Secondary | ICD-10-CM

## 2018-07-24 DIAGNOSIS — M6281 Muscle weakness (generalized): Secondary | ICD-10-CM

## 2018-07-24 DIAGNOSIS — R29898 Other symptoms and signs involving the musculoskeletal system: Secondary | ICD-10-CM

## 2018-07-24 DIAGNOSIS — R293 Abnormal posture: Secondary | ICD-10-CM

## 2018-07-24 DIAGNOSIS — R2689 Other abnormalities of gait and mobility: Secondary | ICD-10-CM

## 2018-07-25 ENCOUNTER — Encounter: Payer: Medicare Other | Admitting: Occupational Therapy

## 2018-07-25 NOTE — Therapy (Signed)
[facility] [address] [place], [place], [address] Phone: [phone]   Fax:  [phone]  Physical Therapy Treatment  Patient Details  Name: [name] MRN: [id number] Date of Birth: 1940/09/29 Referring Provider (PT): Larey Seat, MD   Encounter Date: 07/24/2018  PT End of Session - 07/24/18 1405    Visit Number  42    Number of Visits  6    Date for PT Re-Evaluation  08/09/18    Authorization Type  [employer] Pt has met $5900 oop, full coverage, 10th visit PN    PT Start Time  1401    PT Stop Time  1445    PT Time Calculation (min)  44 min    Equipment Utilized During Treatment  Gait belt    Activity Tolerance  Patient tolerated treatment well;Patient limited by fatigue;Patient limited by pain    Behavior During Therapy  [facility] for tasks assessed/performed       Past Medical History:  Diagnosis Date  . Arthritis   . Barrett esophagus   . Bronchitis   . CAD (coronary artery disease)    pt's wife denies any cardiac history on pt.  . Cancer ([place])    thyroid  . Carotid artery occlusion   . Constipation   . COPD (chronic obstructive pulmonary disease) ([place])   . Diabetes mellitus without complication ([place])   . Dyspnea   . GERD (gastroesophageal reflux disease)   . Headache    migraines  . History of thyroid cancer 2008  . History of thyroid cancer   . Hypertension   . OSA (obstructive sleep apnea)   . Pneumonia   . Restless leg   . S/P tendon repair    left thumb  . Sleep apnea with use of continuous positive airway pressure (CPAP)    [facility] , AHI  77cn central and obstrcutive. 16 cm water , 3 cm EPR.   . Thoracic ascending aortic aneurysm (HCC)    4.2 cm ascending TAA 08/18/17 CTA. Annual imaging recommended.  . Thyroid disease   . Ulcer    Peptic ulcer disease    Past Surgical History:  Procedure Laterality Date  . ANTERIOR CERVICAL DECOMP/DISCECTOMY FUSION N/A 09/03/2017   Procedure: ACDF - C3-C4 - C4-C5 - C5-C6;  Surgeon: Kary Kos, MD;  Location: [facility];  Service: Neurosurgery;  Laterality: N/A;  . CAROTID ENDARTERECTOMY Left January 03, 2006   Dr. Amedeo Plenty  . COLONOSCOPY    . EYE SURGERY Bilateral    cataract surgery with lens implants  . game keepers thumb Right   . JOINT REPLACEMENT Left    knee X 2  . ROTATOR CUFF REPAIR Right   . THYROIDECTOMY  2008    There were no vitals filed for this visit.  Subjective Assessment - 07/24/18 1403    Subjective  No falls. Did walk with just spouse out of bathroom to chair, which involved turns both right and left to get to chair.     Patient is accompained by:  Family member    Pertinent History  CAD, DM2, COPD, hypoxic respiratory failure, HTN,    Currently in Pain?  Yes    Pain Score  7    pretty consistent all day long   Pain Location  Shoulder    Pain Orientation  Left    Pain Descriptors / Indicators  Sharp;Sore    Pain Type  Chronic pain    Pain Onset  More than a month ago    Pain Frequency  Constant    Aggravating Factors   weight bearing    Pain Relieving Factors  medication         07/24/18 1407  Transfers  Transfers Sit to Stand;Stand to Sit  Sit to Stand 5: Supervision;With upper extremity assist;With armrests;From chair/3-in-1  Stand to Sit 5: Supervision;With upper extremity assist;With armrests;To chair/3-in-1  Stand Pivot Transfers 5: Supervision;4: Min guard  Stand Pivot Transfer Details (indicate cue type and reason) with RW for 90* turns x 3 in session with cues on posture and sequencing.   Ambulation/Gait  Ambulation/Gait Yes  Ambulation/Gait Assistance 5: Supervision;4: Min guard  Ambulation/Gait Assistance Details cues for posture, to "power up" through LE's for increased extension and for walker position with gait.   Ambulation Distance (Feet) 50 Feet (x2)  Assistive device Rolling walker  Gait Pattern Step-to pattern;Step-through pattern;Decreased step length - right;Decreased  step length - left;Decreased stride length;Decreased hip/knee flexion - right;Decreased hip/knee flexion - left;Decreased dorsiflexion - left;Decreased dorsiflexion - right;Left foot flat;Right foot flat;Right flexed knee in stance;Left flexed knee in stance;Shuffle;Trunk flexed;Poor foot clearance - right;Poor foot clearance - left  Ambulation Surface Level;Indoor  Stairs Yes  Stairs Assistance 4: Min guard;4: Min assist;3: Mod assist (mod with left rail leading with right LE)  Stairs Assistance Details (indicate cue type and reason) 1st rep: 2 rails up fwd/down fwd with cues on hand advancement and weight shifitng; 2cd rep with left rail: up/down sideways leading with right LE with min to mod assist needed, along with cues on foot placement and weight shifting; 3rd rep with left rail up/down sideways with min assist needed while leading with left LE, cues for foot placement and weight shifitng.                       Stair Management Technique Two rails;Step to pattern;Forwards;One rail Right;One rail Left;Sideways  Number of Stairs 4 (x3 reps )  Height of Stairs 6       PT Short Term Goals - 07/10/18 1114      PT SHORT TERM GOAL #1   Title  Patient & wife demonstrate & verbalize understanding of updated HEP. (All updated STGs Target Date: 07/12/2018)    Baseline  MET 07/08/2018    Time  1    Period  Months    Status  Achieved      PT SHORT TERM GOAL #2   Title  Patient performs stand-pivot transfer with RW between 2 chairs with armrests with supervision.     Baseline  MET 07/08/2018    Time  1    Period  Months    Status  Achieved      PT SHORT TERM GOAL #3   Title  Patient ambulates 60' between 2 chairs with RW with wife's assist safely.     Time  1    Period  Months    Status  Achieved      PT SHORT TERM GOAL #4   Title  Patient negotiates stairs with 2 rails step-to with minA.     Time  1    Period  Months    Status  Achieved      PT SHORT TERM GOAL #5   Title  Patient  negotiates curb with RW with modA.     Baseline  MET 07/08/2018    Time  1    Period  Months    Status  Achieved        PT Long Term Goals - 07/10/18 2135      PT LONG TERM GOAL #1   Title  Patient & wife demonstrate & verbalize understanding of ongoing exercise / fitness plan including community programs. (All LTGs 08/09/2018)    Time  3    Period  Months    Status  On-going      PT LONG TERM GOAL #2   Title  Consistent stand pivot transfer w/c < > bed/mat with RW and MOD I    Time  3    Period  Months    Status  Revised      PT LONG TERM GOAL #3   Title  Patient and wife verbalize understanding of recommendations to manage spasms / hypertonic motions.     Time  3    Period  Months    Status  New      PT LONG TERM GOAL #4   Title  Sit to/from stand chairs with armrests to RW MOD I    Time  3    Period  Months    Status  Revised      PT LONG TERM GOAL #5   Title  Patient standing balance with one UE support x 5-8 minutes while scanning environment and reaching outside BOS (high, low, laterally) with supervision    Time  3    Period  Months    Status  Revised      PT LONG TERM GOAL #6   Title  Patient ambulates 115' over indoor surfaces and 25' outside over pavement with RW with minA.     Time  3    Period  Months    Status  Revised            Plan - 07/24/18 1405    Clinical Impression Statement  Today's skilled session continued to address gait and stairs. Slight increase in assistance needed with sideways on stairs today, possibly due to increased pain he is having. The pt is progressing toward goals, did walk with just spouse at home, and should benefit from continued PT to progress toward unmet goals.     Rehab Potential  Good    Clinical Impairments Affecting Rehab Potential  weakness & ROM impaired 4 extremities & trunk; dependency transfers, bed mobility, standing & gait    PT Frequency  2x / week    PT Duration  Other (comment)   13 weeks, 90 days    PT Treatment/Interventions  ADLs/Self Care Home Management;Canalith Repostioning;Moist Heat;DME Instruction;Gait training;Stair training;Functional mobility training;Therapeutic activities;Therapeutic exercise;Balance training;Neuromuscular re-education;Patient/family education;Manual techniques;Passive range of motion;Vestibular    PT Next Visit Plan  Continue to review activities that pt can do at home without wife there; review reaching up high, down low into cabinets and retrieving items safely, side stepping along counter top to reach items outside BOS.  stair negotiation - step ups and step downs increasing height of step, increasing distance with gait with RW.      PT Home Exercise Plan  Access Code: [email or web address]     Consulted and Agree with Plan of Care  Patient;Family member/caregiver    Family Member Consulted  wife       Patient will benefit from skilled therapeutic intervention in order to improve the following deficits and impairments:  Abnormal gait, Cardiopulmonary status limiting activity, Decreased activity tolerance, Decreased balance, Decreased endurance, Decreased knowledge of use of DME, Decreased mobility, Decreased range of motion,  Decreased strength, Difficulty walking, Dizziness, Impaired flexibility, Impaired UE functional use, Postural dysfunction  Visit Diagnosis: Abnormal posture  Unsteadiness on feet  Muscle weakness (generalized)  Other symptoms and signs involving the musculoskeletal system  Other symptoms and signs involving the nervous system  Other abnormalities of gait and mobility     Problem List Patient Active Problem List   Diagnosis Date Noted  . Postural urinary incontinence 05/20/2018  . 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 Myoclonic jerking 05/20/2018  . Myelopathy ([place]) 09/03/2017  . Acute respiratory failure with hypoxemia ([place]) 08/18/2017  . Hypokalemia 08/18/2017  . Leukocytosis  08/18/2017  . Muscle atrophy of lower extremity 08/09/2017  . Bowel and bladder incontinence 08/09/2017  . Right-sided muscle weakness 08/09/2017  . Neuropathy 08/09/2017  . 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/2017  . Abnormality of gait 07/12/2017  . Myoclonic jerkings, 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/2017  . Acquired right foot drop 07/12/2017  . UTI (urinary tract infection) due to Enterococcus 07/12/2017  . Pressure injury of skin 06/21/2017  . Acute cystitis 06/20/2017  . Generalized weakness 06/20/2017  . Dehydration 06/20/2017  . Dyspnea 12/26/2016  . Chronic cough 12/26/2016  . Hypersomnia with sleep apnea 10/30/2016  . CSA (central sleep apnea) 10/30/2016  . Wheezing symptom 10/30/2016  . CPAP/BiPAP dependent 07/01/2015  . 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 RLS (restless legs syndrome) 08/25/2014  . Primary gout 08/25/2014  . OSA on CPAP 08/25/2014  . Severe obesity (BMI >= 40) ([place]) 08/25/2014  . Obesity (BMI 30-39.9) 02/18/2013  . Sleep apnea with use of continuous positive airway pressure (CPAP)   . 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 S/P TKR (total knee replacement) 11/29/2012  . History of thyroid cancer     Willow Ora, PTA, [facility] [address] [place], [place] [address] [phone] 07/25/18, 9:57 PM   Name: TIPTON BALLOW MRN: [id number] Date of Birth: 17-Aug-1940

## 2018-07-29 ENCOUNTER — Ambulatory Visit: Payer: Medicare Other | Admitting: Physical Therapy

## 2018-07-29 ENCOUNTER — Encounter: Payer: Medicare Other | Admitting: Occupational Therapy

## 2018-07-29 ENCOUNTER — Encounter: Payer: Self-pay | Admitting: Physical Therapy

## 2018-07-29 DIAGNOSIS — R2689 Other abnormalities of gait and mobility: Secondary | ICD-10-CM

## 2018-07-29 DIAGNOSIS — M6281 Muscle weakness (generalized): Secondary | ICD-10-CM

## 2018-07-29 DIAGNOSIS — R29818 Other symptoms and signs involving the nervous system: Secondary | ICD-10-CM

## 2018-07-29 DIAGNOSIS — R2681 Unsteadiness on feet: Secondary | ICD-10-CM

## 2018-07-29 DIAGNOSIS — R29898 Other symptoms and signs involving the musculoskeletal system: Secondary | ICD-10-CM

## 2018-07-29 DIAGNOSIS — R293 Abnormal posture: Secondary | ICD-10-CM

## 2018-07-30 NOTE — Therapy (Signed)
[facility] [address] [place], [place], [address] Phone: [phone]   Fax:  [phone]  Physical Therapy Treatment  Patient Details  Name: [name] MRN: [id number] Date of Birth: June 03, 1941 Referring Provider (PT): Larey Seat, MD   Encounter Date: 07/29/2018  PT End of Session - 07/29/18 1230    Visit Number  43    Number of Visits  54    Date for PT Re-Evaluation  08/09/18    Authorization Type  [employer] Pt has met $5900 oop, full coverage, 10th visit PN    PT Start Time  1230    PT Stop Time  1315    PT Time Calculation (min)  45 min    Equipment Utilized During Treatment  Gait belt    Activity Tolerance  Patient tolerated treatment well;Patient limited by fatigue;Patient limited by pain    Behavior During Therapy  [facility] for tasks assessed/performed       Past Medical History:  Diagnosis Date  . Arthritis   . Barrett esophagus   . Bronchitis   . CAD (coronary artery disease)    pt's wife denies any cardiac history on pt.  . Cancer ([place])    thyroid  . Carotid artery occlusion   . Constipation   . COPD (chronic obstructive pulmonary disease) ([place])   . Diabetes mellitus without complication ([place])   . Dyspnea   . GERD (gastroesophageal reflux disease)   . Headache    migraines  . History of thyroid cancer 2008  . History of thyroid cancer   . Hypertension   . OSA (obstructive sleep apnea)   . Pneumonia   . Restless leg   . S/P tendon repair    left thumb  . Sleep apnea with use of continuous positive airway pressure (CPAP)    2011 [facility] , AHI  77cn central and obstrcutive. 16 cm water , 3 cm EPR.   . Thoracic ascending aortic aneurysm (HCC)    4.2 cm ascending TAA 08/18/17 CTA. Annual imaging recommended.  . Thyroid disease   . Ulcer    Peptic ulcer disease    Past Surgical History:  Procedure Laterality Date  . ANTERIOR CERVICAL DECOMP/DISCECTOMY FUSION N/A 09/03/2017   Procedure: ACDF - C3-C4 - C4-C5 - C5-C6;  Surgeon: Kary Kos, MD;  Location: [facility];  Service: Neurosurgery;  Laterality: N/A;  . CAROTID ENDARTERECTOMY Left January 03, 2006   Dr. Amedeo Plenty  . COLONOSCOPY    . EYE SURGERY Bilateral    cataract surgery with lens implants  . game keepers thumb Right   . JOINT REPLACEMENT Left    knee X 2  . ROTATOR CUFF REPAIR Right   . THYROIDECTOMY  2008    There were no vitals filed for this visit.  Subjective Assessment - 07/29/18 1230    Subjective  No falls. Did walk with just spouse out of bathroom to chair, which involved turns both right and left to get to chair.   (Pended)     Patient is accompained by:  Family member  (Pended)     Pertinent History  CAD, DM2, COPD, hypoxic respiratory failure, HTN,  (Pended)     Pain Onset  More than a month ago  (Pended)                        [facility] Adult PT Treatment/Exercise - 07/29/18 1230      Transfers  Transfers  Sit to Stand;Stand to Sit    Sit to Stand  5: Supervision;With upper extremity assist;With armrests;From chair/3-in-1    Stand to Sit  5: Supervision;With upper extremity assist;With armrests;To chair/3-in-1      Ambulation/Gait   Ambulation/Gait  Yes    Ambulation/Gait Assistance  5: Supervision    Ambulation Distance (Feet)  50 Feet    Assistive device  Rolling walker    Gait Pattern  Step-to pattern;Step-through pattern;Decreased step length - right;Decreased step length - left;Decreased stride length;Decreased hip/knee flexion - right;Decreased hip/knee flexion - left;Decreased dorsiflexion - left;Decreased dorsiflexion - right;Left foot flat;Right foot flat;Right flexed knee in stance;Left flexed knee in stance;Shuffle;Trunk flexed;Poor foot clearance - right;Poor foot clearance - left    Ramp  4: Min assist   RW   Ramp Details (indicate cue type and reason)  pt's wife assisted with PT supervision & verbal cues for her positioning & technique    Curb  4: Min assist   RW    Curb Details (indicate cue type and reason)  pt's wife assisted with PT supervision & verbal cues for her positioning & technique             PT Education - 07/29/18 1300    Education Details  increasing activity level with short distance (within room) increasing frequency, long walk (his max tolerance) 1-2 times per day (PT demo how to bring w/c & guard pt) and medium walks (build up 4-6 times) between rooms.     Person(s) Educated  Patient;Spouse    Methods  Explanation;Demonstration;Verbal cues    Comprehension  Verbalized understanding       PT Short Term Goals - 07/10/18 1114      PT SHORT TERM GOAL #1   Title  Patient & wife demonstrate & verbalize understanding of updated HEP. (All updated STGs Target Date: 07/12/2018)    Baseline  MET 07/08/2018    Time  1    Period  Months    Status  Achieved      PT SHORT TERM GOAL #2   Title  Patient performs stand-pivot transfer with RW between 2 chairs with armrests with supervision.     Baseline  MET 07/08/2018    Time  1    Period  Months    Status  Achieved      PT SHORT TERM GOAL #3   Title  Patient ambulates 92' between 2 chairs with RW with wife's assist safely.     Time  1    Period  Months    Status  Achieved      PT SHORT TERM GOAL #4   Title  Patient negotiates stairs with 2 rails step-to with minA.     Time  1    Period  Months    Status  Achieved      PT SHORT TERM GOAL #5   Title  Patient negotiates curb with RW with modA.     Baseline  MET 07/08/2018    Time  1    Period  Months    Status  Achieved        PT Long Term Goals - 07/29/18 1800      PT LONG TERM GOAL #1   Title  Patient & wife demonstrate & verbalize understanding of ongoing exercise / fitness plan including community programs. (All LTGs 08/09/2018)    Time  3    Period  Months    Status  On-going  Target Date  08/09/18      PT LONG TERM GOAL #2   Title  Consistent stand pivot transfer w/c < > bed/mat with RW and MOD I    Time   3    Period  Months    Status  On-going    Target Date  08/09/18      PT LONG TERM GOAL #3   Title  Patient and wife verbalize understanding of recommendations to manage spasms / hypertonic motions.     Time  3    Period  Months    Status  On-going    Target Date  08/09/18      PT LONG TERM GOAL #4   Title  Sit to/from stand chairs with armrests to RW MOD I    Time  3    Period  Months    Status  On-going    Target Date  08/09/18      PT LONG TERM GOAL #5   Title  Patient standing balance with one UE support x 5-8 minutes while scanning environment and reaching outside BOS (high, low, laterally) with supervision    Time  3    Period  Months    Status  On-going    Target Date  08/09/18      PT LONG TERM GOAL #6   Title  Patient ambulates 115' over indoor surfaces and 25' outside over pavement with RW with minA.     Time  3    Period  Months    Status  On-going    Target Date  08/09/18      PT LONG TERM GOAL #7   Title  Patient & wife verbalize understanding of how to assist gait & activity tolerance.     Status  New    Target Date  08/09/18      PT LONG TERM GOAL #8   Title  Patient's wife demonstrates safe assistance negotiating ramps & curbs.     Status  New    Target Date  08/09/18            Plan - 07/29/18 1800    Clinical Impression Statement  Today's skilled session focused on instruction in increasing activity level and wife's ability to assist with ramps & curbs.     Rehab Potential  Good    Clinical Impairments Affecting Rehab Potential  weakness & ROM impaired 4 extremities & trunk; dependency transfers, bed mobility, standing & gait    PT Frequency  2x / week    PT Duration  Other (comment)   13 weeks, 90 days   PT Treatment/Interventions  ADLs/Self Care Home Management;Canalith Repostioning;Moist Heat;DME Instruction;Gait training;Stair training;Functional mobility training;Therapeutic activities;Therapeutic exercise;Balance training;Neuromuscular  re-education;Patient/family education;Manual techniques;Passive range of motion;Vestibular    PT Next Visit Plan  work towards [place] including pt's wife ability to assist on ramps & curbs    PT Home Exercise Plan  Access Code: [email or web address]     Consulted and Agree with Plan of Care  Patient;Family member/caregiver    Family Member Consulted  wife       Patient will benefit from skilled therapeutic intervention in order to improve the following deficits and impairments:  Abnormal gait, Cardiopulmonary status limiting activity, Decreased activity tolerance, Decreased balance, Decreased endurance, Decreased knowledge of use of DME, Decreased mobility, Decreased range of motion, Decreased strength, Difficulty walking, Dizziness, Impaired flexibility, Impaired UE functional use, Postural dysfunction  Visit Diagnosis: Abnormal posture  Unsteadiness on feet  Muscle weakness (generalized)  Other symptoms and signs involving the musculoskeletal system  Other symptoms and signs involving the nervous system  Other abnormalities of gait and mobility     Problem List Patient Active Problem List   Diagnosis Date Noted  . Postural urinary incontinence 05/20/2018  . 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 Myoclonic jerking 05/20/2018  . Myelopathy ([place]) 09/03/2017  . Acute respiratory failure with hypoxemia ([place]) 08/18/2017  . Hypokalemia 08/18/2017  . Leukocytosis 08/18/2017  . Muscle atrophy of lower extremity 08/09/2017  . Bowel and bladder incontinence 08/09/2017  . Right-sided muscle weakness 08/09/2017  . Neuropathy 08/09/2017  . 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/2017  . Abnormality of gait 07/12/2017  . Myoclonic jerkings, 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/2017  . Acquired right foot drop 07/12/2017  . UTI (urinary tract infection) due to Enterococcus 07/12/2017  . Pressure injury of skin 06/21/2017  . Acute cystitis 06/20/2017   . Generalized weakness 06/20/2017  . Dehydration 06/20/2017  . Dyspnea 12/26/2016  . Chronic cough 12/26/2016  . Hypersomnia with sleep apnea 10/30/2016  . CSA (central sleep apnea) 10/30/2016  . Wheezing symptom 10/30/2016  . CPAP/BiPAP dependent 07/01/2015  . 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 RLS (restless legs syndrome) 08/25/2014  . Primary gout 08/25/2014  . OSA on CPAP 08/25/2014  . Severe obesity (BMI >= 40) ([place]) 08/25/2014  . Obesity (BMI 30-39.9) 02/18/2013  . Sleep apnea with use of continuous positive airway pressure (CPAP)   . 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 S/P TKR (total knee replacement) 11/29/2012  . History of thyroid cancer     Pura Picinich PT, DPT 07/30/2018, 7:19 AM  [facility] [address] [place], [place], [address] Phone: [phone]   Fax:  [phone]  Name: [name] MRN: [id number] Date of Birth: 22-Jan-1941

## 2018-07-31 ENCOUNTER — Encounter: Payer: Self-pay | Admitting: Physical Therapy

## 2018-07-31 ENCOUNTER — Encounter: Payer: Medicare Other | Admitting: Occupational Therapy

## 2018-07-31 ENCOUNTER — Ambulatory Visit: Payer: Medicare Other | Admitting: Physical Therapy

## 2018-07-31 DIAGNOSIS — R293 Abnormal posture: Secondary | ICD-10-CM

## 2018-07-31 DIAGNOSIS — R2681 Unsteadiness on feet: Secondary | ICD-10-CM

## 2018-07-31 DIAGNOSIS — R2689 Other abnormalities of gait and mobility: Secondary | ICD-10-CM

## 2018-07-31 NOTE — Therapy (Signed)
[facility] [address] [place], [place], [address] Phone: [phone]   Fax:  [phone]  Physical Therapy Treatment  Patient Details  Name: [name] MRN: [id number] Date of Birth: 02-13-1941 Referring Provider (PT): Larey Seat, MD   Encounter Date: 07/31/2018  PT End of Session - 07/31/18 1302    Visit Number  44    Number of Visits  95    Date for PT Re-Evaluation  08/09/18    Authorization Type  [employer] Pt has met $5900 oop, full coverage, 10th visit PN    PT Start Time  1146    PT Stop Time  1235    PT Time Calculation (min)  49 min    Equipment Utilized During Treatment  --    Activity Tolerance  Patient tolerated treatment well;Patient limited by pain   L shoulder pain   Behavior During Therapy  [facility] for tasks assessed/performed       Past Medical History:  Diagnosis Date  . Arthritis   . Barrett esophagus   . Bronchitis   . CAD (coronary artery disease)    pt's wife denies any cardiac history on pt.  . Cancer ([place])    thyroid  . Carotid artery occlusion   . Constipation   . COPD (chronic obstructive pulmonary disease) ([place])   . Diabetes mellitus without complication ([place])   . Dyspnea   . GERD (gastroesophageal reflux disease)   . Headache    migraines  . History of thyroid cancer 2008  . History of thyroid cancer   . Hypertension   . OSA (obstructive sleep apnea)   . Pneumonia   . Restless leg   . S/P tendon repair    left thumb  . Sleep apnea with use of continuous positive airway pressure (CPAP)    [facility] , AHI  77cn central and obstrcutive. 16 cm water , 3 cm EPR.   . Thoracic ascending aortic aneurysm (HCC)    4.2 cm ascending TAA 08/18/17 CTA. Annual imaging recommended.  . Thyroid disease   . Ulcer    Peptic ulcer disease    Past Surgical History:  Procedure Laterality Date  . ANTERIOR CERVICAL DECOMP/DISCECTOMY FUSION N/A 09/03/2017   Procedure: ACDF  - C3-C4 - C4-C5 - C5-C6;  Surgeon: Kary Kos, MD;  Location: [facility];  Service: Neurosurgery;  Laterality: N/A;  . CAROTID ENDARTERECTOMY Left January 03, 2006   Dr. Amedeo Plenty  . COLONOSCOPY    . EYE SURGERY Bilateral    cataract surgery with lens implants  . game keepers thumb Right   . JOINT REPLACEMENT Left    knee X 2  . ROTATOR CUFF REPAIR Right   . THYROIDECTOMY  2008    There were no vitals filed for this visit.  Subjective Assessment - 07/31/18 1156    Subjective  Still increasing walking distance; walking consistently from bathroom  > computer.  Wife goes to work for [employer], [employer].  Shoulder hurting a lot today.    Patient is accompained by:  Family member    Pertinent History  CAD, DM2, COPD, hypoxic respiratory failure, HTN,    Patient Stated Goals  To walk in home & community, wife wants him to be able to care for himself if something happens to her    Currently in Pain?  Yes    Pain Onset  More than a month ago  [facility] Adult PT Treatment/Exercise - 07/31/18 1226      Ambulation/Gait   Ambulation/Gait  Yes    Ambulation/Gait Assistance  4: Min guard    Ambulation/Gait Assistance Details  outside over paved surfaces    Ambulation Distance (Feet)  45 Feet   38 and then 82 in combination with ramp and curb   Assistive device  Rolling walker    Gait Pattern  Step-through pattern;Decreased step length - right;Decreased step length - left;Decreased stride length;Decreased hip/knee flexion - right;Decreased hip/knee flexion - left;Decreased dorsiflexion - left;Decreased dorsiflexion - right;Left foot flat;Right foot flat;Right flexed knee in stance;Left flexed knee in stance;Shuffle;Trunk flexed;Poor foot clearance - right;Poor foot clearance - left    Ambulation Surface  Unlevel;Outdoor;Paved    Ramp  4: Min assist    Ramp Details (indicate cue type and reason)  of therapist and then wife on outdoor cement ramp with RW    Curb  4: Min  assist    Curb Details (indicate cue type and reason)  with therapist and then wife outside on paved curb      Knee/Hip Exercises: Stretches   Hip Flexor Stretch  Both;60 seconds    Hip Flexor Stretch Limitations  reclined on wedge with improved pt tolance.      Shoulder Exercises: Stretch   Table Stretch - External Rotation  60 seconds    Table Stretch - External Rotation Limitations  reclined on wedge with therapist providing facilitation for further anterior chest stretch             PT Education - 07/31/18 1301    Education Details  curb/ramp training outside with wife    Person(s) Educated  Patient    Methods  Explanation;Demonstration    Comprehension  Verbalized understanding;Returned demonstration       PT Short Term Goals - 07/10/18 1114      PT SHORT TERM GOAL #1   Title  Patient & wife demonstrate & verbalize understanding of updated HEP. (All updated STGs Target Date: 07/12/2018)    Baseline  MET 07/08/2018    Time  1    Period  Months    Status  Achieved      PT SHORT TERM GOAL #2   Title  Patient performs stand-pivot transfer with RW between 2 chairs with armrests with supervision.     Baseline  MET 07/08/2018    Time  1    Period  Months    Status  Achieved      PT SHORT TERM GOAL #3   Title  Patient ambulates 32' between 2 chairs with RW with wife's assist safely.     Time  1    Period  Months    Status  Achieved      PT SHORT TERM GOAL #4   Title  Patient negotiates stairs with 2 rails step-to with minA.     Time  1    Period  Months    Status  Achieved      PT SHORT TERM GOAL #5   Title  Patient negotiates curb with RW with modA.     Baseline  MET 07/08/2018    Time  1    Period  Months    Status  Achieved        PT Long Term Goals - 07/29/18 1800      PT LONG TERM GOAL #1   Title  Patient & wife demonstrate & verbalize understanding of ongoing exercise / fitness  plan including community programs. (All LTGs 08/09/2018)    Time  3     Period  Months    Status  On-going    Target Date  08/09/18      PT LONG TERM GOAL #2   Title  Consistent stand pivot transfer w/c < > bed/mat with RW and MOD I    Time  3    Period  Months    Status  On-going    Target Date  08/09/18      PT LONG TERM GOAL #3   Title  Patient and wife verbalize understanding of recommendations to manage spasms / hypertonic motions.     Time  3    Period  Months    Status  On-going    Target Date  08/09/18      PT LONG TERM GOAL #4   Title  Sit to/from stand chairs with armrests to RW MOD I    Time  3    Period  Months    Status  On-going    Target Date  08/09/18      PT LONG TERM GOAL #5   Title  Patient standing balance with one UE support x 5-8 minutes while scanning environment and reaching outside BOS (high, low, laterally) with supervision    Time  3    Period  Months    Status  On-going    Target Date  08/09/18      PT LONG TERM GOAL #6   Title  Patient ambulates 115' over indoor surfaces and 25' outside over pavement with RW with minA.     Time  3    Period  Months    Status  On-going    Target Date  08/09/18      PT LONG TERM GOAL #7   Title  Patient & wife verbalize understanding of how to assist gait & activity tolerance.     Status  New    Target Date  08/09/18      PT LONG TERM GOAL #8   Title  Patient's wife demonstrates safe assistance negotiating ramps & curbs.     Status  New    Target Date  08/09/18            Plan - 07/31/18 1305    Clinical Impression Statement  Initiated curb and ramp training outside over paved surfaces first with therapist and then with wife.  Pt and wife performed safely but would like to practice again before D/C.  Pt demonstrated increased gait distance today even after performing ramp and curb.  Ended session with reclined hip flexor and anterior chest stretch with pt demonstrating improved tolerance today.  Will continue to progress towards LTG.    Rehab Potential  Good     Clinical Impairments Affecting Rehab Potential  weakness & ROM impaired 4 extremities & trunk; dependency transfers, bed mobility, standing & gait    PT Frequency  2x / week    PT Duration  Other (comment)   13 weeks, 90 days   PT Treatment/Interventions  ADLs/Self Care Home Management;Canalith Repostioning;Moist Heat;DME Instruction;Gait training;Stair training;Functional mobility training;Therapeutic activities;Therapeutic exercise;Balance training;Neuromuscular re-education;Patient/family education;Manual techniques;Passive range of motion;Vestibular    PT Next Visit Plan  work towards [place] including pt's wife ability to assist on ramps & curbs    PT Home Exercise Plan  Access Code: [email or web address]     Consulted and Agree with Plan of Care  Patient;Family member/caregiver    Family  Member Consulted  wife       Patient will benefit from skilled therapeutic intervention in order to improve the following deficits and impairments:  Abnormal gait, Cardiopulmonary status limiting activity, Decreased activity tolerance, Decreased balance, Decreased endurance, Decreased knowledge of use of DME, Decreased mobility, Decreased range of motion, Decreased strength, Difficulty walking, Dizziness, Impaired flexibility, Impaired UE functional use, Postural dysfunction  Visit Diagnosis: Abnormal posture  Unsteadiness on feet  Other abnormalities of gait and mobility     Problem List Patient Active Problem List   Diagnosis Date Noted  . Postural urinary incontinence 05/20/2018  . 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 Myoclonic jerking 05/20/2018  . Myelopathy ([place]) 09/03/2017  . Acute respiratory failure with hypoxemia ([place]) 08/18/2017  . Hypokalemia 08/18/2017  . Leukocytosis 08/18/2017  . Muscle atrophy of lower extremity 08/09/2017  . Bowel and bladder incontinence 08/09/2017  . Right-sided muscle weakness 08/09/2017  . Neuropathy 08/09/2017   . 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/2017  . Abnormality of gait 07/12/2017  . Myoclonic jerkings, 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/2017  . Acquired right foot drop 07/12/2017  . UTI (urinary tract infection) due to Enterococcus 07/12/2017  . Pressure injury of skin 06/21/2017  . Acute cystitis 06/20/2017  . Generalized weakness 06/20/2017  . Dehydration 06/20/2017  . Dyspnea 12/26/2016  . Chronic cough 12/26/2016  . Hypersomnia with sleep apnea 10/30/2016  . CSA (central sleep apnea) 10/30/2016  . Wheezing symptom 10/30/2016  . CPAP/BiPAP dependent 07/01/2015  . 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 RLS (restless legs syndrome) 08/25/2014  . Primary gout 08/25/2014  . OSA on CPAP 08/25/2014  . Severe obesity (BMI >= 40) ([place]) 08/25/2014  . Obesity (BMI 30-39.9) 02/18/2013  . Sleep apnea with use of continuous positive airway pressure (CPAP)   . 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 S/P TKR (total knee replacement) 11/29/2012  . History of thyroid cancer     Rico Junker, PT, DPT 07/31/18    1:13 PM    [facility] [address] [place], [place], [address] Phone: [phone]   Fax:  [phone]  Name: GIOVANNI BIBY MRN: [id number] Date of Birth: Dec 12, 1940

## 2018-08-05 ENCOUNTER — Ambulatory Visit: Payer: Medicare Other | Admitting: Physical Therapy

## 2018-08-05 ENCOUNTER — Encounter: Payer: Self-pay | Admitting: Physical Therapy

## 2018-08-05 DIAGNOSIS — M6281 Muscle weakness (generalized): Secondary | ICD-10-CM

## 2018-08-05 DIAGNOSIS — R29898 Other symptoms and signs involving the musculoskeletal system: Secondary | ICD-10-CM

## 2018-08-05 DIAGNOSIS — R293 Abnormal posture: Secondary | ICD-10-CM

## 2018-08-05 DIAGNOSIS — R2681 Unsteadiness on feet: Secondary | ICD-10-CM

## 2018-08-05 DIAGNOSIS — R2689 Other abnormalities of gait and mobility: Secondary | ICD-10-CM

## 2018-08-06 NOTE — Therapy (Signed)
[facility] [address] [place], [place], [address] Phone: [phone]   Fax:  [phone]  Physical Therapy Treatment  Patient Details  Name: [name] MRN: [id number] Date of Birth: April 09, 1941 Referring Provider (PT): Larey Seat, MD   Encounter Date: 08/05/2018  PT End of Session - 08/05/18 1450    Visit Number  45    Number of Visits  74    Date for PT Re-Evaluation  08/09/18    Authorization Type  [employer] Pt has met $5900 oop, full coverage, 10th visit PN    PT Start Time  1400    PT Stop Time  1445    PT Time Calculation (min)  45 min    Activity Tolerance  Patient tolerated treatment well;Patient limited by pain   L shoulder pain   Behavior During Therapy  [facility] for tasks assessed/performed       Past Medical History:  Diagnosis Date  . Arthritis   . Barrett esophagus   . Bronchitis   . CAD (coronary artery disease)    pt's wife denies any cardiac history on pt.  . Cancer ([place])    thyroid  . Carotid artery occlusion   . Constipation   . COPD (chronic obstructive pulmonary disease) ([place])   . Diabetes mellitus without complication ([place])   . Dyspnea   . GERD (gastroesophageal reflux disease)   . Headache    migraines  . History of thyroid cancer 2008  . History of thyroid cancer   . Hypertension   . OSA (obstructive sleep apnea)   . Pneumonia   . Restless leg   . S/P tendon repair    left thumb  . Sleep apnea with use of continuous positive airway pressure (CPAP)    [facility] , AHI  77cn central and obstrcutive. 16 cm water , 3 cm EPR.   . Thoracic ascending aortic aneurysm (HCC)    4.2 cm ascending TAA 08/18/17 CTA. Annual imaging recommended.  . Thyroid disease   . Ulcer    Peptic ulcer disease    Past Surgical History:  Procedure Laterality Date  . ANTERIOR CERVICAL DECOMP/DISCECTOMY FUSION N/A 09/03/2017   Procedure: ACDF - C3-C4 - C4-C5 - C5-C6;  Surgeon: Kary Kos, MD;  Location: [facility];  Service: Neurosurgery;  Laterality: N/A;  . CAROTID ENDARTERECTOMY Left January 03, 2006   Dr. Amedeo Plenty  . COLONOSCOPY    . EYE SURGERY Bilateral    cataract surgery with lens implants  . game keepers thumb Right   . JOINT REPLACEMENT Left    knee X 2  . ROTATOR CUFF REPAIR Right   . THYROIDECTOMY  2008    There were no vitals filed for this visit.  Subjective Assessment - 08/05/18 1400    Subjective  His wife is working early voting and he is alone several hours. He can transfer & stand including managing pants by himself.     Patient is accompained by:  Family member    Pertinent History  CAD, DM2, COPD, hypoxic respiratory failure, HTN,    Patient Stated Goals  To walk in home & community, wife wants him to be able to care for himself if something happens to her    Currently in Pain?  No/denies    Pain Onset  More than a month ago  [facility] Adult PT Treatment/Exercise - 08/05/18 1400      Transfers   Transfers  Sit to Stand;Stand to [employer] Transfers    Sit to Stand  6: Modified independent (Device/Increase time);With upper extremity assist;With armrests;From chair/3-in-1    Stand to Sit  6: Modified independent (Device/Increase time);With upper extremity assist;With armrests;To chair/3-in-1    Stand Pivot Transfers  6: Modified independent (Device/Increase time);With armrests   with RW     Ambulation/Gait   Ambulation/Gait  Yes    Ambulation/Gait Assistance  5: Supervision    Ambulation Distance (Feet)  100 Feet   50' & 100'   Assistive device  Rolling walker    Gait Pattern  Step-to pattern;Step-through pattern;Decreased step length - right;Decreased step length - left;Decreased stride length;Decreased hip/knee flexion - right;Decreased hip/knee flexion - left;Decreased dorsiflexion - left;Decreased dorsiflexion - right;Left foot flat;Right foot flat;Right flexed knee in stance;Left flexed knee in  stance;Shuffle;Trunk flexed;Poor foot clearance - right;Poor foot clearance - left    Ambulation Surface  Indoor;Level    Ramp  4: Min assist   RW   Ramp Details (indicate cue type and reason)  PT verbal cues & demo proper assisting to CNA    Curb  4: Min assist   RW   Curb Details (indicate cue type and reason)  PT verbal cues & demo proper assisting to CNA               PT Short Term Goals - 07/10/18 1114      PT SHORT TERM GOAL #1   Title  Patient & wife demonstrate & verbalize understanding of updated HEP. (All updated STGs Target Date: 07/12/2018)    Baseline  MET 07/08/2018    Time  1    Period  Months    Status  Achieved      PT SHORT TERM GOAL #2   Title  Patient performs stand-pivot transfer with RW between 2 chairs with armrests with supervision.     Baseline  MET 07/08/2018    Time  1    Period  Months    Status  Achieved      PT SHORT TERM GOAL #3   Title  Patient ambulates 86' between 2 chairs with RW with wife's assist safely.     Time  1    Period  Months    Status  Achieved      PT SHORT TERM GOAL #4   Title  Patient negotiates stairs with 2 rails step-to with minA.     Time  1    Period  Months    Status  Achieved      PT SHORT TERM GOAL #5   Title  Patient negotiates curb with RW with modA.     Baseline  MET 07/08/2018    Time  1    Period  Months    Status  Achieved        PT Long Term Goals - 08/05/18 1800      PT LONG TERM GOAL #1   Title  Patient & wife demonstrate & verbalize understanding of ongoing exercise / fitness plan including community programs. (All LTGs 08/09/2018)    Time  3    Period  Months    Status  On-going    Target Date  08/09/18      PT LONG TERM GOAL #2   Title  Consistent stand pivot transfer w/c < > bed/mat with RW and MOD I  Baseline  MET 08/05/2018    Time  3    Period  Months    Status  Achieved      PT LONG TERM GOAL #3   Title  Patient and wife verbalize understanding of recommendations to manage  spasms / hypertonic motions.     Baseline  MET 08/05/2018    Time  3    Period  Months    Status  Achieved      PT LONG TERM GOAL #4   Title  Sit to/from stand chairs with armrests to RW MOD I    Baseline  MET 08/05/2018    Time  3    Period  Months    Status  Achieved      PT LONG TERM GOAL #5   Title  Patient standing balance with one UE support x 5-8 minutes while scanning environment and reaching outside BOS (high, low, laterally) with supervision    Time  3    Period  Months    Status  On-going    Target Date  08/09/18      PT LONG TERM GOAL #6   Title  Patient ambulates 115' over indoor surfaces and 25' outside over pavement with RW with minA.     Time  3    Period  Months    Status  On-going    Target Date  08/09/18      PT LONG TERM GOAL #7   Title  Patient & wife verbalize understanding of how to assist gait & activity tolerance.     Status  On-going    Target Date  08/09/18      PT LONG TERM GOAL #8   Title  Patient's wife demonstrates safe assistance negotiating ramps & curbs.     Status  On-going    Target Date  08/09/18            Plan - 08/05/18 1800    Clinical Impression Statement  Patient met 3 LTGs checked today. PT will check remaining 4 with wife present at last visit. CNA attended session today and verbalizes how to assist patient after PT instruction.    Rehab Potential  Good    Clinical Impairments Affecting Rehab Potential  weakness & ROM impaired 4 extremities & trunk; dependency transfers, bed mobility, standing & gait    PT Frequency  2x / week    PT Duration  Other (comment)   13 weeks, 90 days   PT Treatment/Interventions  ADLs/Self Care Home Management;Canalith Repostioning;Moist Heat;DME Instruction;Gait training;Stair training;Functional mobility training;Therapeutic activities;Therapeutic exercise;Balance training;Neuromuscular re-education;Patient/family education;Manual techniques;Passive range of motion;Vestibular    PT Next Visit  Plan  check remaining LTGs and discharge    PT Home Exercise Plan  Access Code: [email or web address]     Consulted and Agree with Plan of Care  Patient;Family member/caregiver    Family Member Consulted  wife       Patient will benefit from skilled therapeutic intervention in order to improve the following deficits and impairments:  Abnormal gait, Cardiopulmonary status limiting activity, Decreased activity tolerance, Decreased balance, Decreased endurance, Decreased knowledge of use of DME, Decreased mobility, Decreased range of motion, Decreased strength, Difficulty walking, Dizziness, Impaired flexibility, Impaired UE functional use, Postural dysfunction  Visit Diagnosis: Abnormal posture  Other abnormalities of gait and mobility  Unsteadiness on feet  Muscle weakness (generalized)  Other symptoms and signs involving the musculoskeletal system     Problem List Patient Active Problem List   Diagnosis  Date Noted  . Postural urinary incontinence 05/20/2018  . 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 Myoclonic jerking 05/20/2018  . Myelopathy ([place]) 09/03/2017  . Acute respiratory failure with hypoxemia ([place]) 08/18/2017  . Hypokalemia 08/18/2017  . Leukocytosis 08/18/2017  . Muscle atrophy of lower extremity 08/09/2017  . Bowel and bladder incontinence 08/09/2017  . Right-sided muscle weakness 08/09/2017  . Neuropathy 08/09/2017  . 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/2017  . Abnormality of gait 07/12/2017  . Myoclonic jerkings, 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/2017  . Acquired right foot drop 07/12/2017  . UTI (urinary tract infection) due to Enterococcus 07/12/2017  . Pressure injury of skin 06/21/2017  . Acute cystitis 06/20/2017  . Generalized weakness 06/20/2017  . Dehydration 06/20/2017  . Dyspnea 12/26/2016  . Chronic cough 12/26/2016  . Hypersomnia with sleep apnea 10/30/2016  . CSA (central sleep apnea) 10/30/2016  .  Wheezing symptom 10/30/2016  . CPAP/BiPAP dependent 07/01/2015  . 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 RLS (restless legs syndrome) 08/25/2014  . Primary gout 08/25/2014  . OSA on CPAP 08/25/2014  . Severe obesity (BMI >= 40) ([place]) 08/25/2014  . Obesity (BMI 30-39.9) 02/18/2013  . Sleep apnea with use of continuous positive airway pressure (CPAP)   . 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 S/P TKR (total knee replacement) 11/29/2012  . History of thyroid cancer     Aalani Aikens PT, DPT 08/06/2018, 6:00 AM  [facility] [address] [place], [place], [address] Phone: [phone]   Fax:  [phone]  Name: RICHRD KUZNIAR MRN: [id number] Date of Birth: 01-08-1941

## 2018-08-07 ENCOUNTER — Ambulatory Visit: Payer: Medicare Other | Admitting: Physical Therapy

## 2018-08-09 ENCOUNTER — Ambulatory Visit: Payer: Medicare Other | Admitting: Physical Therapy

## 2018-08-09 ENCOUNTER — Encounter: Payer: Self-pay | Admitting: Physical Therapy

## 2018-08-09 DIAGNOSIS — R2689 Other abnormalities of gait and mobility: Secondary | ICD-10-CM

## 2018-08-09 DIAGNOSIS — R2681 Unsteadiness on feet: Secondary | ICD-10-CM

## 2018-08-09 DIAGNOSIS — R29898 Other symptoms and signs involving the musculoskeletal system: Secondary | ICD-10-CM

## 2018-08-09 DIAGNOSIS — R293 Abnormal posture: Secondary | ICD-10-CM

## 2018-08-09 DIAGNOSIS — M6281 Muscle weakness (generalized): Secondary | ICD-10-CM

## 2018-08-11 NOTE — Therapy (Addendum)
[facility] [address] [place], [place], [address] Phone: [phone]   Fax:  [phone]  Physical Therapy Treatment & Discharge Summary  Patient Details  Name: [name] MRN: [id number] Date of Birth: 1940/08/21 Referring Provider (PT): Larey Seat, MD   Encounter Date: 08/09/2018  PHYSICAL THERAPY DISCHARGE SUMMARY  Visits from Start of Care: 46  Current functional level related to goals / functional outcomes: See below   Remaining deficits: See below   Education / Equipment: Roy with gait  Plan: Patient agrees to discharge.  Patient goals were partially met. Patient is being discharged due to meeting the stated rehab goals.  ?????         Jamey Reas, PT, DPT PT Specializing in [place] 08/12/18 6:17 AM Phone:  [phone]  Fax:  [phone] [facility] [address] [place], [place] [address]     08/09/18 2149  PT Visits / Re-Eval  Visit Number 46  Number of Visits 48  Date for PT Re-Evaluation 08/09/18  Authorization  Authorization Type [employer] Pt has met $5900 oop, full coverage, 10th visit PN  PT Time Calculation  PT Start Time 1401  PT Stop Time 1445  PT Time Calculation (min) 44 min  PT - End of Session  Equipment Utilized During Treatment Gait belt  Activity Tolerance Patient tolerated treatment well (L shoulder pain)  Behavior During Therapy WFL for tasks assessed/performed    Past Medical History:  Diagnosis Date  . Arthritis   . Barrett esophagus   . Bronchitis   . CAD (coronary artery disease)    pt's wife denies any cardiac history on pt.  . Cancer ([place])    thyroid  . Carotid artery occlusion   . Constipation   . COPD (chronic obstructive pulmonary disease) ([place])   . Diabetes mellitus without complication ([place])   . Dyspnea   . GERD (gastroesophageal reflux disease)   . Headache    migraines  .  History of thyroid cancer 2008  . History of thyroid cancer   . Hypertension   . OSA (obstructive sleep apnea)   . Pneumonia   . Restless leg   . S/P tendon repair    left thumb  . Sleep apnea with use of continuous positive airway pressure (CPAP)    [facility] , AHI  77cn central and obstrcutive. 16 cm water , 3 cm EPR.   . Thoracic ascending aortic aneurysm (HCC)    4.2 cm ascending TAA 08/18/17 CTA. Annual imaging recommended.  . Thyroid disease   . Ulcer    Peptic ulcer disease    Past Surgical History:  Procedure Laterality Date  . ANTERIOR CERVICAL DECOMP/DISCECTOMY FUSION N/A 09/03/2017   Procedure: ACDF - C3-C4 - C4-C5 - C5-C6;  Surgeon: Kary Kos, MD;  Location: [facility];  Service: Neurosurgery;  Laterality: N/A;  . CAROTID ENDARTERECTOMY Left January 03, 2006   Dr. Amedeo Plenty  . COLONOSCOPY    . EYE SURGERY Bilateral    cataract surgery with lens implants  . game keepers thumb Right   . JOINT REPLACEMENT Left    knee X 2  . ROTATOR CUFF REPAIR Right   . THYROIDECTOMY  2008    There were no vitals filed for this visit.     08/09/18 1407  Symptoms/Limitations  Subjective No new complaints. No falls.  Patient is accompained by: Family member  Pertinent History CAD, DM2, COPD,  hypoxic respiratory failure, HTN,  Patient Stated Goals To walk in home & community, wife wants him to be able to care for himself if something happens to her  Pain Assessment  Currently in Pain? No/denies  Pain Score 0      08/09/18 1410  Transfers  Transfers Sit to Stand;Stand to Sit  Sit to Stand 6: Modified independent (Device/Increase time);With upper extremity assist;With armrests;From chair/3-in-1  Stand to Sit 6: Modified independent (Device/Increase time);With upper extremity assist;With armrests;To chair/3-in-1  Ambulation/Gait  Ambulation/Gait Yes  Ambulation/Gait Assistance 5: Supervision  Ambulation/Gait Assistance Details cues to power up for posture and for incr step  length.   Ambulation Distance (Feet) 35 Feet (x1, 15 x1, 80 x1)  Assistive device Rolling walker  Gait Pattern Step-to pattern;Step-through pattern;Decreased step length - right;Decreased step length - left;Decreased stride length;Decreased hip/knee flexion - right;Decreased hip/knee flexion - left;Decreased dorsiflexion - left;Decreased dorsiflexion - right;Left foot flat;Right foot flat;Right flexed knee in stance;Left flexed knee in stance;Shuffle;Trunk flexed;Poor foot clearance - right;Poor foot clearance - left  Ambulation Surface Level;Indoor  Ramp 4: Min assist (spouse providing assist)  Ramp Details (indicate cue type and reason) spouse able to cue pt appropraitely with good technique  Curb 4: Min assist (spouse providing assist)  Curb Details (indicate cue type and reason) pt able to cue pt correctly and guard him accurately as well.   Therapeutic Activites   Therapeutic Activities Other Therapeutic Activities  Other Therapeutic Activities at counter top: intermittent UE support on counter with single to bil UEs' for 6.5 minutes while engaged in the following activities: reaching all directions, opening drawers, opening cabinets, looking around and side stepping       PT Short Term Goals - 07/10/18 1114      PT [place] #1   Title  Patient & wife demonstrate & verbalize understanding of updated HEP. (All updated STGs Target Date: 07/12/2018)    Baseline  MET 07/08/2018    Time  1    Period  Months    Status  Achieved      PT SHORT TERM GOAL #2   Title  Patient performs stand-pivot transfer with RW between 2 chairs with armrests with supervision.     Baseline  MET 07/08/2018    Time  1    Period  Months    Status  Achieved      PT SHORT TERM GOAL #3   Title  Patient ambulates 40' between 2 chairs with RW with wife's assist safely.     Time  1    Period  Months    Status  Achieved      PT SHORT TERM GOAL #4   Title  Patient negotiates stairs with 2 rails step-to  with minA.     Time  1    Period  Months    Status  Achieved      PT SHORT TERM GOAL #5   Title  Patient negotiates curb with RW with modA.     Baseline  MET 07/08/2018    Time  1    Period  Months    Status  Achieved        PT Long Term Goals - 08/05/18 1800      PT LONG TERM GOAL #1   Title  Patient & wife demonstrate & verbalize understanding of ongoing exercise / fitness plan including community programs. (All LTGs 08/09/2018)    Time  3    Period  Months  Status  On-going    Target Date  08/09/18      PT LONG TERM GOAL #2   Title  Consistent stand pivot transfer w/c < > bed/mat with RW and MOD I    Baseline  MET 08/05/2018    Time  3    Period  Months    Status  Achieved      PT LONG TERM GOAL #3   Title  Patient and wife verbalize understanding of recommendations to manage spasms / hypertonic motions.     Baseline  MET 08/05/2018    Time  3    Period  Months    Status  Achieved      PT LONG TERM GOAL #4   Title  Sit to/from stand chairs with armrests to RW MOD I    Baseline  MET 08/05/2018    Time  3    Period  Months    Status  Achieved      PT LONG TERM GOAL #5   Title  Patient standing balance with one UE support x 5-8 minutes while scanning environment and reaching outside BOS (high, low, laterally) with supervision    Time  3    Period  Months    Status  On-going    Target Date  08/09/18      PT LONG TERM GOAL #6   Title  Patient ambulates 115' over indoor surfaces and 25' outside over pavement with RW with minA.     Time  3    Period  Months    Status  On-going    Target Date  08/09/18      PT LONG TERM GOAL #7   Title  Patient & wife verbalize understanding of how to assist gait & activity tolerance.     Status  On-going    Target Date  08/09/18      PT LONG TERM GOAL #8   Title  Patient's wife demonstrates safe assistance negotiating ramps & curbs.     Status  On-going    Target Date  08/09/18      PT Long Term Goals - 08/09/18 2152       PT LONG TERM GOAL #1   Title  Patient & wife demonstrate & verbalize understanding of ongoing exercise / fitness plan including community programs. (All LTGs 08/09/2018)    Baseline  08/09/18: both verbalize need to continue with HEP and gait/standing at home to maintain gains made in therapy    Status  Achieved      PT LONG TERM GOAL #2   Title  Consistent stand pivot transfer w/c < > bed/mat with RW and MOD I    Baseline  MET 08/05/2018    Status  Achieved      PT LONG TERM GOAL #3   Title  Patient and wife verbalize understanding of recommendations to manage spasms / hypertonic motions.     Baseline  MET 08/05/2018    Status  Achieved      PT LONG TERM GOAL #4   Title  Sit to/from stand chairs with armrests to RW MOD I    Baseline  MET 08/05/2018    Status  Achieved      PT LONG TERM GOAL #5   Title  Patient standing balance with one UE support x 5-8 minutes while scanning environment and reaching outside BOS (high, low, laterally) with supervision    Baseline  08/09/18: met today i    Status  Achieved      PT LONG TERM GOAL #6   Title  Patient ambulates 115' over indoor surfaces and 25' outside over pavement with RW with minA.     Baseline  08/09/18: max distance pt has made to this date is 100 feet with previous session. still improved from baseline.     Status  Partially Met      PT LONG TERM GOAL #7   Title  Patient & wife verbalize understanding of how to assist gait & activity tolerance.     Baseline  08/09/18: met today    Status  Achieved      PT LONG TERM GOAL #8   Title  Patient's wife demonstrates safe assistance negotiating ramps & curbs.     Baseline  08/09/18: met today    Status  Achieved           08/09/18 2154  Plan  Clinical Impression Statement Today's skilled session focused on remainindg LTGs with pt meeting all but one which was partially met. Both pt and spouse agree to discharge today.   Pt will benefit from skilled therapeutic intervention in order  to improve on the following deficits Abnormal gait;Cardiopulmonary status limiting activity;Decreased activity tolerance;Decreased balance;Decreased endurance;Decreased knowledge of use of DME;Decreased mobility;Decreased range of motion;Decreased strength;Difficulty walking;Dizziness;Impaired flexibility;Impaired UE functional use;Postural dysfunction  Rehab Potential Good  Clinical Impairments Affecting Rehab Potential weakness & ROM impaired 4 extremities & trunk; dependency transfers, bed mobility, standing & gait  PT Frequency 2x / week  PT Duration Other (comment) (13 weeks, 90 days)  PT Treatment/Interventions ADLs/Self Care Home Management;Canalith Repostioning;Moist Heat;DME Instruction;Gait training;Stair training;Functional mobility training;Therapeutic activities;Therapeutic exercise;Balance training;Neuromuscular re-education;Patient/family education;Manual techniques;Passive range of motion;Vestibular  PT Next Visit Plan discharge  PT Home Exercise Plan Access Code: [email or web address]   Consulted and Agree with Plan of Care Patient;Family member/caregiver  Family Member Consulted wife         Patient will benefit from skilled therapeutic intervention in order to improve the following deficits and impairments:     Visit Diagnosis: Abnormal posture  Other abnormalities of gait and mobility  Muscle weakness (generalized)  Unsteadiness on feet  Other symptoms and signs involving the musculoskeletal system     Problem List Patient Active Problem List   Diagnosis Date Noted  . Postural urinary incontinence 05/20/2018  . 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 Myoclonic jerking 05/20/2018  . Myelopathy ([place]) 09/03/2017  . Acute respiratory failure with hypoxemia ([place]) 08/18/2017  . Hypokalemia 08/18/2017  . Leukocytosis 08/18/2017  . Muscle atrophy of lower extremity 08/09/2017  . Bowel and bladder incontinence 08/09/2017   . Right-sided muscle weakness 08/09/2017  . Neuropathy 08/09/2017  . 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/2017  . Abnormality of gait 07/12/2017  . Myoclonic jerkings, 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/2017  . Acquired right foot drop 07/12/2017  . UTI (urinary tract infection) due to Enterococcus 07/12/2017  . Pressure injury of skin 06/21/2017  . Acute cystitis 06/20/2017  . Generalized weakness 06/20/2017  . Dehydration 06/20/2017  . Dyspnea 12/26/2016  . Chronic cough 12/26/2016  . Hypersomnia with sleep apnea 10/30/2016  . CSA (central sleep apnea) 10/30/2016  . Wheezing symptom 10/30/2016  . CPAP/BiPAP dependent 07/01/2015  . 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 RLS (restless legs syndrome) 08/25/2014  . Primary gout 08/25/2014  . OSA on CPAP 08/25/2014  . Severe obesity (BMI >= 40) ([place]) 08/25/2014  . Obesity (BMI 30-39.9) 02/18/2013  .  Sleep apnea with use of continuous positive airway pressure (CPAP)   . 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 S/P TKR (total knee replacement) 11/29/2012  . History of thyroid cancer     Willow Ora, PTA, [facility] [address], [place] [facility] [place], [place] [address] [phone] 08/11/18, 9:49 PM   Name: [name] MRN: [id number] Date of Birth: 07/10/1940

## 2018-09-03 ENCOUNTER — Telehealth: Payer: Self-pay | Admitting: Neurology

## 2018-09-03 ENCOUNTER — Other Ambulatory Visit: Payer: Self-pay | Admitting: Neurology

## 2018-09-03 DIAGNOSIS — Z9989 Dependence on other enabling machines and devices: Secondary | ICD-10-CM

## 2018-09-03 DIAGNOSIS — G4733 Obstructive sleep apnea (adult) (pediatric): Secondary | ICD-10-CM

## 2018-09-03 NOTE — Telephone Encounter (Signed)
Pts wife called stating that [facility] informed her that the form that was sent to order his new CPAP was filled out incorrectly. Please advise.

## 2018-09-03 NOTE — Telephone Encounter (Signed)
Called the patient's I have not even received a form from [facility] to fill out. I informed her that I would place a order and send in our computer system to [facility] for the patient. She verbalized understanding. Also informed the importance of keeping the 6/8 apt as it will be needed as a initial cpap follow up

## 2018-11-19 ENCOUNTER — Encounter: Payer: Self-pay | Admitting: Adult Health

## 2018-11-20 ENCOUNTER — Telehealth: Payer: Self-pay | Admitting: Neurology

## 2018-11-20 ENCOUNTER — Encounter: Payer: Self-pay | Admitting: Neurology

## 2018-11-20 NOTE — Telephone Encounter (Signed)
Due to current COVID 19 pandemic, our office is severely reducing in office visits until further notice, in order to minimize the risk to our patients and healthcare providers.   Called patient regarding his appointment on 6/8. Patient accepts a virtual visit and I have sent an e-mail to him with link and directions as well as my contact information at the office for reference. Patient understands he will receive a call from RN to update chart prior to appointment.  Pt understands that although there may be some limitations with this type of visit, we will take all precautions to reduce any security or privacy concerns.  Pt understands that this will be treated like an in office visit and we will file with pt's insurance, and there may be a patient responsible charge related to this service.

## 2018-11-20 NOTE — Telephone Encounter (Signed)
Called the patient to review their chart and made sure that everything was up to date. Patient informed they received the e-mail/text message for the visit. Instructed to make sure they hold on to the e-mail/text for the upcoming appointment as it is necessary to access their appointment. Instructed the patient that apx 30 min prior to the appointment the front staff will contact them to make sure they are ready to go for their appointment in case there is any need for troubleshooting it can be completed prior to the appointment time. Reminded the patient once more that this is treated as a Office visit and the patient must be prepared for the visit and ready at the time of their appointment preferably in a well lit area where they have good connection for the visit. Pt verbalized understanding.   

## 2018-11-25 ENCOUNTER — Other Ambulatory Visit: Payer: Self-pay

## 2018-11-25 ENCOUNTER — Ambulatory Visit (INDEPENDENT_AMBULATORY_CARE_PROVIDER_SITE_OTHER): Payer: Medicare Other | Admitting: Neurology

## 2018-11-25 ENCOUNTER — Encounter: Payer: Self-pay | Admitting: Neurology

## 2018-11-25 DIAGNOSIS — G4730 Sleep apnea, unspecified: Secondary | ICD-10-CM

## 2018-11-25 DIAGNOSIS — G4731 Primary central sleep apnea: Secondary | ICD-10-CM | POA: Diagnosis not present

## 2018-11-25 DIAGNOSIS — M4712 Other spondylosis with myelopathy, cervical region: Secondary | ICD-10-CM

## 2018-11-25 DIAGNOSIS — R0600 Dyspnea, unspecified: Secondary | ICD-10-CM

## 2018-11-25 DIAGNOSIS — Z85850 Personal history of malignant neoplasm of thyroid: Secondary | ICD-10-CM | POA: Diagnosis not present

## 2018-11-25 DIAGNOSIS — R0609 Other forms of dyspnea: Secondary | ICD-10-CM

## 2018-11-25 DIAGNOSIS — G959 Disease of spinal cord, unspecified: Secondary | ICD-10-CM

## 2018-11-25 NOTE — Patient Instructions (Signed)
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  will try a chin strap and F30 i mask.  RV in 4- 6 month with NP

## 2018-11-25 NOTE — Progress Notes (Signed)
[facility]   Provider:  Larey Seat, MD   Referring Provider: [name], MD   Primary Care Physician:  [name], MD    Virtual Visit via Video Note  I connected with [name] on 11/25/18 at  3:30 PM EDT by a video enabled telemedicine application and verified that I am speaking with the correct person using two identifiers.  Location: Patient: at home alone- I cannot hear the pateint but see him well. Provider: at [facility]    I discussed the limitations of evaluation and management by telemedicine and the availability of in person appointments. The patient expressed understanding and agreed to proceed.  History of Present Illness:  " I sleep great on that CPAP " . Uses CPAP auto set 10 - and a lot of frowny faces.  9 hours and 45 minutes average use of auto CPAP between 12-20 cm water. EPR 1 cm . AHI 25.9 and 21.8?h unkown apneas- all artefact form air leakage.     Observations/Objective:  High compliance, high AHI of unkown events " 21.8/h - this is air leak related. He has a full beard and did not have a chinstrap.  He is willing to shave-  He has grown a beard over the last 12 month after admissions to hospital and rehab, nursing homes. He is unable to walk.  Reports nagging back pain in AM.   The patient is back, in his private home and complaint, cooperative and alert.     Assessment and Plan: 78 year old patient with caregiver coming to the home. Lives with wife who drives. Has complex sleep apnea and did well on CPAP until he developed severe leaks, likely because of weight loss, facial changes, now back to 200# 45 pounds below the weight at the time when he was fitted for a mask.   Shave ! I offered a chin strap . His nose stops up.  He dislikes nasal prongs.   Follow Up Instructions: RV with Np in 6 month for CPAP compliance.  Can he be shown an F 30 I by DME, please. I will order a chin strap , too.  [place]  - [employer] is his DME.   I discussed the assessment and treatment plan with the patient. The patient was provided an opportunity to ask questions and all were answered. The patient agreed with the plan and demonstrated an understanding of the instructions.   The patient was advised to call back or seek an in-person evaluation if the symptoms worsen or if the condition fails to improve as anticipated.  I provided 27 minutes of non-face-to-face time during this encounter.   Larey Seat, MD    11-25-2018    HPI: I had the pleasure of meeting today with Cesar Course, MD 78 year old gentleman seen on 20 May 2018 after a 37-month hiatus. In my last visit the patient has reported difficulties with controlling his bladder and bowel, he had become very weak both lower extremities were affected but also he was leaning towards his right side and felt that there was weakness of the right upper extremity and torso.  I obtained first an MRI of the brain with and without contrast which did not show an onset of, there was moderate to moderate generalized cortical atrophy and 1 minimal chronic microvascular changes overall healthy for age.  Dr. Mariea Stable then followed with a cervical spine MRI which showed severe spinal stenosis with an anterior posterior diameter of  5 mm, causing spinal cord compression and myelopathic signal changes within the spinal cord.  There were severe bilateral foraminal narrowing noticed especially at C4 there was more moderate stenosis between C4-5, again severe stenosis at C5 and C6, and again C6 and C7 had only mild spinal stenosis.  The patient was immediately referred for surgical decompression with Dr. Saintclair Halsted neurosurgery.  There were also multilevel degenerative changes at the lower spine but none of this was comparable to the cervical spine findings.  He is now 7 month post surgical intervention and has had PT, rehab.  He is not in pain, has less myoclonic jerks.    I have  followed Mr. [name] mainly for a sleep disorder and he has been 100% compliant CPAP user using an AutoSet between 12 and 20 cmH2O was 2 cm EPR, he has a lots of air leaks which causes erroneously high apnea problems.  He also struggles with bronchitis and a productive cough and has a nasal septal deviation that makes it harder to breathe through the nose.  He endorsed today the fatigue severity scale at 50, the Epworth Sleepiness Scale at 20 out of 24 points and the geriatric depression scale at 7 out of 15 points. He has a bruised nasal bridge which comes as a result from wearing the mask too tightly.       [name] is a 78 y.o. male is seen here today, 07-12-2017 , for intermittent leg jerking movements as well as irregular loss of muscle tone and control of the lower extremities.  His knees may buckle without warning.  Other times he was able to walk.  He is currently residing in a nursing facility and the goal is to get him home, but his rehab has been hampered by the unpredictable exercise tolerance and participation. Around Christmas time he suffered a urinary tract infection which set him back quite a bit, on January 2 he was admitted to hospital he required IV antibiotics, on the fifth he was discharged from hospital to a rehab-nursing home, where he still stays.  While his admission diagnosis was a urosepsis-urinary tract infection he will now need a new diagnosis to continue with rehab.  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  He had 2 replacement of the left knee with in a month, dating back to the year 2012 be relieved.  There has been no surgery to the right leg neither hip nor knee have been no back surgeries or spinal interventions. The myoclonic jerks are much more dominantly expressed with the right lower extremity, but his knees buckle on both sides but not necessarily at the same time. His feet  don't lift well of the ground, especially the right.     01 May 2017.   He has again been a very compliant CPAP use of his 100% of the last 30 days daily use and 80% by hours.  Average use at times 4 hours 42 minutes, the patient uses an AutoSet between 12 and 18 cmH2O was wanted me to EPR is a residual AHI of 11.1.  The vast majority of these residual apneas are obstructive in nature usually means that he does need a little bit more pressure.  The 95th percentile pressure was 17.5 cm water.  I would like to increase the maximum pressure to 20 cm was 2 cm EPR.  He will stay on the memory foam lined mask the so-called air touch interface.  He likes it and  it has reduced air leaks significantly.  He reports that his spouse still feels that there is too much air leak however it has not woken him up she is waking him up.  His fatigue severity score today was endorsed to 22 points but his Epworth sleepiness score is still high at 15 points, the geriatric depression score was endorsed at only 1 out of 15 points. He is happy with his results, more alertness.    10-30-2016, [name] has been, as usual, and excellent compliant patient. He has used CPAP was 90% compliance and an average of 5 hours and 90 minutes daily, uses an AutoSet between 12 and 18 cm water was 1 cm EPR and hasn't residual apnea index of 5.8. This is a little higher than I like it's but I think it is related to higher air leaks. The 95th percentile pressure was 17.1, which is an unusual high pressure needs. The air leaks were remarkably high. The patient sometimes is aware at night that there is an air leak but not every night I would like for him to be refitted to a different interface. I would like him to try an air touch fullface mask, which has no resting point on the forehead. He also endorsed 16 points on his Epworth Sleepiness Scale which is too high, and 43 points on the fatigue. His geriatric depression score on the other hand was only endorsed at one out of 15 points. He continues to use multiple medications  including ropinirole 1 mg in the morning and 4 mg at night for restless leg control. Carbidopa levodopa2 times a day for RLS, he also takes metoprolol, levo-thyroxin,  Rizatriptan,  and allopurinol. Various over-the-counter medication-nutritional supplements. RLS goes away after sinemet intake. 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  Revisit note from 07/01/2015. I me today with [name], who has an excellent compliance on his CPAP  S 9 machine. We interrogated the machine by wife 5 today here in the office and it revealed that his AHI is 6.7 with 100% compliance for days of use and an average user time of 6 hours and 6 minutes. He is using an AutoSet between 12 and 18 cm water pressure was 1 cm EPR the 95th percentile pressure is 16. He does have sometimes significant air leaks that the mask leak he is using a full face mask as evident by the pressure marks at least on his face.he cannot tolerate nasal airflow.   He has bronchitic breathing and has been short of air while talking.  His gout remains well controlled, but her mentioned how expensive his medications are , needed to achieve this control.  He reports restless legs and filled the Johns-Hopkins Questionnaire for quality of life in RLS.  The patient has been long time established, and  underwent a split study in 2011 at [place]. His AHI was 77 per hour and he was titrated to 16 cm water pressure with a 3 cm EPR. The residual AHI became lower than 5. A download reorder from August 2013 showed 100% compliance. I was unable today to obtain the data for 2014 but asked Mr. Borquez 'es DME - [facility] here in [place]) to provide [place] with the most recent data. He got this download per [name] today.  Today the download shows been 96% compliance over the last 90 days, average time of CPAP use per day at 6 hours and 52 minutes, residual AHI is 6.9 and the sac  pressure is 16 cm water with 3 cm EPR there is a moderately high air leak, the  pressure is so high his mask blows off his face. He needs a new mask and headgear soon.  I would like for him to have an auto-titrator machine with a setting from 8 through 16 cm water . He states he never sleeps without his CPAP and likes the machines effect on his sleep, rest, agility.  He has 2- 3 nocturia breaks interrupting his sleep. Bedtime is between 23 hours and midnight. Subjective getting 7-8 hours of sleep , wakes up spontaneously at 7 AM.         Review of Systems: Out of a complete 14 system review, the patient complains of only the following symptoms, and all other reviewed systems are negative.  SOB, fatigue reduced. GDS one points,  How likely are you to doze in the following situations: 0 = not likely, 1 = slight chance, 2 = moderate chance, 3 = high chance  Sitting and Reading? 2 Watching Television?3 Sitting inactive in a public place (theater or meeting)?2 Lying down in the afternoon when circumstances permit?2 Sitting and talking to someone?1 Sitting quietly after lunch without alcohol?1 In a car, while stopped for a few minutes in traffic?0 As a passenger in a car for an hour without a break?1  Total =17 before CPAP- now 12 .FSS 38 points, shoulder pain keeps him from sleeping.   He has been more up and about now in the nursing home with rehab activities, but he has noticed both knees to buckle sometimes both at the same time sometimes 1, he has myoclonic jerks in the right leg seem to be more hip flexion jerks the hip flexor is affected, this is not a knee extension jerk.  And he does have problems to lift the right foot.  There is not much strength dorsal or plantar flexion.  It seems that the range of motion at the ankle was reduced.  Left foot is much more strong.  He adjusted his diet , his wife no longer cooks fried foods, more whole wheat , salads.  His son is a Child psychotherapist and supplies the couple with whole wheat .  Breakfast with caffeine , 2-3 cups of coffee.  Non smoker,  Non drinker.      Social History   Socioeconomic History  . Marital status: Married    Spouse name: [name]  . Number of children: 2  . Years of education: COLLEGE  . Highest education level: Not on file  Occupational History    Employer: RETIRED  Social Needs  . Financial resource strain: Not on file  . Food insecurity:    Worry: Not on file    Inability: Not on file  . Transportation needs:    Medical: Not on file    Non-medical: Not on file  Tobacco Use  . Smoking status: Former Smoker    Years: 40.00    Types: Cigars, Cigarettes    Last attempt to quit: 06/19/2005    Years since quitting: 13.4  . Smokeless tobacco: Never Used  . Tobacco comment: smoked 4-6 cigars daily, only smoked cigarettes socially  Substance and Sexual Activity  . Alcohol use: No  . Drug use: No  . Sexual activity: Not on file  Lifestyle  . Physical activity:    Days per week: Not on file    Minutes per session: Not on file  . Stress: Not on file  Relationships  .  Social connections:    Talks on phone: Not on file    Gets together: Not on file    Attends religious service: Not on file    Active member of club or organization: Not on file    Attends meetings of clubs or organizations: Not on file    Relationship status: Not on file  . Intimate partner violence:    Fear of current or ex partner: Not on file    Emotionally abused: Not on file    Physically abused: Not on file    Forced sexual activity: Not on file  Other Topics Concern  . Not on file  Social History Narrative   Patient is married [name]) and lives at home with his wife.   Patient has two children.   Patient is retired.   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   Patient is right-handed.   Patient drinks three cups of coffee per day.    Family History  Problem Relation Age of Onset  . COPD Mother   . Hyperlipidemia Mother   . Hypertension Mother   . Diabetes Father   . Kidney disease  Father        ESRD  . Hypertension Father   . Cancer Father   . Hyperlipidemia Father     Past Medical History:  Diagnosis Date  . Arthritis   . Barrett esophagus   . Bronchitis   . CAD (coronary artery disease)    pt's wife denies any cardiac history on pt.  . Cancer ([place])    thyroid  . Carotid artery occlusion   . Constipation   . COPD (chronic obstructive pulmonary disease) ([place])   . Diabetes mellitus without complication ([place])   . Dyspnea   . GERD (gastroesophageal reflux disease)   . Headache    migraines  . History of thyroid cancer 2008  . History of thyroid cancer   . Hypertension   . OSA (obstructive sleep apnea)   . Pneumonia   . Restless leg   . S/P tendon repair    left thumb  . Sleep apnea with use of continuous positive airway pressure (CPAP)    2011 [facility] , AHI  77cn central and obstrcutive. 16 cm water , 3 cm EPR.   . Thoracic ascending aortic aneurysm (HCC)    4.2 cm ascending TAA 08/18/17 CTA. Annual imaging recommended.  . Thyroid disease   . Ulcer    Peptic ulcer disease    Past Surgical History:  Procedure Laterality Date  . ANTERIOR CERVICAL DECOMP/DISCECTOMY FUSION N/A 09/03/2017   Procedure: ACDF - C3-C4 - C4-C5 - C5-C6;  Surgeon: Kary Kos, MD;  Location: [facility];  Service: Neurosurgery;  Laterality: N/A;  . CAROTID ENDARTERECTOMY Left January 03, 2006   Dr. Amedeo Plenty  . COLONOSCOPY    . EYE SURGERY Bilateral    cataract surgery with lens implants  . game keepers thumb Right   . JOINT REPLACEMENT Left    knee X 2  . ROTATOR CUFF REPAIR Right   . THYROIDECTOMY  2008    Current Outpatient Medications  Medication Sig Dispense Refill  . acetaminophen (TYLENOL) 325 MG tablet Take 2 tablets (650 mg total) by mouth every 4 (four) hours as needed for mild pain ((score 1 to 3) or temp > 100.5). 40 tablet 0  . allopurinol (ZYLOPRIM) 300 MG tablet Take 300 mg by mouth daily.   12  . baclofen (LIORESAL) 10  MG tablet Take 1-1.5 tablets (10-15 mg  total) by mouth 3 (three) times daily. (Patient taking differently: Take 10 mg by mouth 3 (three) times daily. ) 135 each 5  . calcium citrate-vitamin D (CITRACAL+D) 315-200 MG-UNIT per tablet Take 1 tablet by mouth daily. 750 mg     . carbidopa-levodopa (SINEMET IR) 10-100 MG tablet Take 1 tablet by mouth 2 (two) times daily. (Patient taking differently: Take 1 tablet by mouth daily. ) 90 tablet 4  . cholecalciferol (VITAMIN D) 1000 units tablet Take 1,000 Units by mouth daily.     . cloNIDine (CATAPRES) 0.1 MG tablet Take 0.1 mg by mouth 2 (two) times daily.  8  . guaifenesin (HUMIBID E) 400 MG TABS tablet Take 400 mg by mouth 2 (two) times daily as needed (congestion).    [name] HYDROcodone-acetaminophen (NORCO/VICODIN) 5-325 MG tablet Take 1 tablet by mouth 3 (three) times daily as needed (pain). (Patient taking differently: Take 1 tablet by mouth daily as needed (pain). ) 30 tablet 0  . levothyroxine (SYNTHROID, LEVOTHROID) 200 MCG tablet Take 200 mcg by mouth daily before breakfast.     . losartan-hydrochlorothiazide (HYZAAR) 100-25 MG per tablet Take 1 tablet by mouth daily.    . metFORMIN (GLUCOPHAGE) 500 MG tablet Take 1 tablet (500 mg total) by mouth 2 (two) times daily with a meal.  0  . metoprolol tartrate (LOPRESSOR) 50 MG tablet Take 50 mg by mouth 2 (two) times daily.    . Multiple Vitamin (MULTIVITAMIN) tablet Take 1 tablet by mouth daily.    . nitroGLYCERIN (NITROSTAT) 0.4 MG SL tablet Place 0.4 mg every 5 (five) minutes as needed under the tongue for chest pain.    [name] oxymetazoline (AFRIN) 0.05 % nasal spray Place 2 sprays into the nose 2 (two) times daily as needed for congestion.     . pantoprazole (PROTONIX) 40 MG tablet Take 40 mg by mouth every evening.    . polyethylene glycol (MIRALAX / GLYCOLAX) packet Take 17 g by mouth daily as needed for moderate constipation. 14 each 0  . rizatriptan (MAXALT) 10 MG tablet     . simvastatin (ZOCOR) 40 MG tablet   0  . TRAVATAN Z 0.004 % SOLN  ophthalmic solution Place 1 drop into both eyes.     . TRELEGY ELLIPTA 100-62.5-25 MCG/INH AEPB   4  . VENTOLIN HFA 108 (90 Base) MCG/ACT inhaler      No current facility-administered medications for this visit.     Allergies as of 11/25/2018  . (No Known Allergies)    Vitals: There were no vitals taken for this visit. Last Weight:  Wt Readings from Last 1 Encounters:  05/20/18 203 lb (92.1 kg)   Last Height:   Ht Readings from Last 1 Encounters:  05/20/18 5\' 7"  (1.702 m)     Larey Seat, MD   05-20-2018

## 2018-12-06 ENCOUNTER — Ambulatory Visit: Payer: Medicare Other | Admitting: Podiatry

## 2018-12-27 ENCOUNTER — Encounter: Payer: Self-pay | Admitting: Podiatry

## 2018-12-27 ENCOUNTER — Ambulatory Visit: Payer: Medicare Other | Admitting: Podiatry

## 2018-12-27 ENCOUNTER — Other Ambulatory Visit: Payer: Self-pay

## 2018-12-27 VITALS — BP 151/84 | HR 70 | Temp 98.0°F | Resp 16

## 2018-12-27 DIAGNOSIS — M79675 Pain in left toe(s): Secondary | ICD-10-CM | POA: Diagnosis not present

## 2018-12-27 DIAGNOSIS — E1151 Type 2 diabetes mellitus with diabetic peripheral angiopathy without gangrene: Secondary | ICD-10-CM

## 2018-12-27 DIAGNOSIS — M79674 Pain in right toe(s): Secondary | ICD-10-CM

## 2018-12-27 DIAGNOSIS — B351 Tinea unguium: Secondary | ICD-10-CM | POA: Diagnosis not present

## 2018-12-27 NOTE — Progress Notes (Signed)
   Subjective:    Patient ID: [name], male    DOB: 1941-01-05, 78 y.o.   MRN: [id number]  HPI    Review of Systems  All other systems reviewed and are negative.      Objective:   Physical Exam        Assessment & Plan:

## 2019-01-01 NOTE — Progress Notes (Signed)
Subjective:   Patient ID: [name], male   DOB: 78 y.o.   MRN: [id number]   HPI Patient presents stating that the nails have been thickened and that he cannot cut them anymore and he does have diabetes and tries to take care of himself.  Patient has also had diabetic shoes in the past and needs a new pair and states that he needs a basic diabetic foot exam.  Patient does not smoke likes to be active   Review of Systems  All other systems reviewed and are negative.       Objective:  Physical Exam Vitals signs and nursing note reviewed.  Constitutional:      Appearance: He is well-developed.  Pulmonary:     Effort: Pulmonary effort is normal.  Musculoskeletal: Normal range of motion.  Skin:    General: Skin is warm.  Neurological:     Mental Status: He is alert.     Neurological found to be moderately diminished as far sharp dull vibratory and patient is found to have diminished pulses DP PT bilateral that are intact but not strong.  Upon questioning does not appear to have current claudication-like symptomatology.  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  This will be done routinely and patient will do daily inspections of feet and if any issues were to occur he is to contact us immediately

## 2019-01-17 ENCOUNTER — Other Ambulatory Visit: Payer: Self-pay

## 2019-01-17 ENCOUNTER — Encounter (HOSPITAL_COMMUNITY): Payer: Self-pay | Admitting: *Deleted

## 2019-01-17 ENCOUNTER — Inpatient Hospital Stay (HOSPITAL_COMMUNITY)
Admission: EM | Admit: 2019-01-17 | Discharge: 2019-01-28 | DRG: 871 | Disposition: A | Discharge status: Home Health | Payer: Medicare Other | Attending: Internal Medicine | Admitting: Internal Medicine

## 2019-01-17 ENCOUNTER — Emergency Department (HOSPITAL_COMMUNITY): Payer: Medicare Other

## 2019-01-17 DIAGNOSIS — N390 Urinary tract infection, site not specified: Secondary | ICD-10-CM | POA: Diagnosis present

## 2019-01-17 DIAGNOSIS — J90 Pleural effusion, not elsewhere classified: Secondary | ICD-10-CM

## 2019-01-17 DIAGNOSIS — I110 Hypertensive heart disease with heart failure: Secondary | ICD-10-CM | POA: Diagnosis present

## 2019-01-17 DIAGNOSIS — G43909 Migraine, unspecified, not intractable, without status migrainosus: Secondary | ICD-10-CM | POA: Diagnosis present

## 2019-01-17 DIAGNOSIS — R0902 Hypoxemia: Secondary | ICD-10-CM | POA: Diagnosis not present

## 2019-01-17 DIAGNOSIS — E890 Postprocedural hypothyroidism: Secondary | ICD-10-CM | POA: Diagnosis present

## 2019-01-17 DIAGNOSIS — J9602 Acute respiratory failure with hypercapnia: Secondary | ICD-10-CM | POA: Diagnosis present

## 2019-01-17 DIAGNOSIS — J9601 Acute respiratory failure with hypoxia: Secondary | ICD-10-CM | POA: Diagnosis present

## 2019-01-17 DIAGNOSIS — R6520 Severe sepsis without septic shock: Secondary | ICD-10-CM | POA: Diagnosis present

## 2019-01-17 DIAGNOSIS — J189 Pneumonia, unspecified organism: Secondary | ICD-10-CM

## 2019-01-17 DIAGNOSIS — Z66 Do not resuscitate: Secondary | ICD-10-CM | POA: Diagnosis present

## 2019-01-17 DIAGNOSIS — M1000 Idiopathic gout, unspecified site: Secondary | ICD-10-CM | POA: Diagnosis present

## 2019-01-17 DIAGNOSIS — G959 Disease of spinal cord, unspecified: Secondary | ICD-10-CM | POA: Diagnosis present

## 2019-01-17 DIAGNOSIS — Z809 Family history of malignant neoplasm, unspecified: Secondary | ICD-10-CM

## 2019-01-17 DIAGNOSIS — Z8249 Family history of ischemic heart disease and other diseases of the circulatory system: Secondary | ICD-10-CM

## 2019-01-17 DIAGNOSIS — G4733 Obstructive sleep apnea (adult) (pediatric): Secondary | ICD-10-CM | POA: Diagnosis present

## 2019-01-17 DIAGNOSIS — Z79899 Other long term (current) drug therapy: Secondary | ICD-10-CM

## 2019-01-17 DIAGNOSIS — K2270 Barrett's esophagus without dysplasia: Secondary | ICD-10-CM | POA: Diagnosis present

## 2019-01-17 DIAGNOSIS — Z8711 Personal history of peptic ulcer disease: Secondary | ICD-10-CM

## 2019-01-17 DIAGNOSIS — G253 Myoclonus: Secondary | ICD-10-CM | POA: Diagnosis present

## 2019-01-17 DIAGNOSIS — I493 Ventricular premature depolarization: Secondary | ICD-10-CM | POA: Diagnosis present

## 2019-01-17 DIAGNOSIS — M5412 Radiculopathy, cervical region: Secondary | ICD-10-CM | POA: Diagnosis present

## 2019-01-17 DIAGNOSIS — E669 Obesity, unspecified: Secondary | ICD-10-CM | POA: Diagnosis present

## 2019-01-17 DIAGNOSIS — J441 Chronic obstructive pulmonary disease with (acute) exacerbation: Secondary | ICD-10-CM | POA: Diagnosis present

## 2019-01-17 DIAGNOSIS — E876 Hypokalemia: Secondary | ICD-10-CM | POA: Diagnosis present

## 2019-01-17 DIAGNOSIS — K219 Gastro-esophageal reflux disease without esophagitis: Secondary | ICD-10-CM | POA: Diagnosis present

## 2019-01-17 DIAGNOSIS — A419 Sepsis, unspecified organism: Secondary | ICD-10-CM | POA: Diagnosis present

## 2019-01-17 DIAGNOSIS — Z8349 Family history of other endocrine, nutritional and metabolic diseases: Secondary | ICD-10-CM

## 2019-01-17 DIAGNOSIS — D638 Anemia in other chronic diseases classified elsewhere: Secondary | ICD-10-CM | POA: Diagnosis present

## 2019-01-17 DIAGNOSIS — G4730 Sleep apnea, unspecified: Secondary | ICD-10-CM | POA: Diagnosis present

## 2019-01-17 DIAGNOSIS — Z833 Family history of diabetes mellitus: Secondary | ICD-10-CM

## 2019-01-17 DIAGNOSIS — J181 Lobar pneumonia, unspecified organism: Secondary | ICD-10-CM | POA: Diagnosis not present

## 2019-01-17 DIAGNOSIS — M4712 Other spondylosis with myelopathy, cervical region: Secondary | ICD-10-CM | POA: Diagnosis present

## 2019-01-17 DIAGNOSIS — I5033 Acute on chronic diastolic (congestive) heart failure: Secondary | ICD-10-CM | POA: Diagnosis present

## 2019-01-17 DIAGNOSIS — Z09 Encounter for follow-up examination after completed treatment for conditions other than malignant neoplasm: Secondary | ICD-10-CM

## 2019-01-17 DIAGNOSIS — Z87891 Personal history of nicotine dependence: Secondary | ICD-10-CM

## 2019-01-17 DIAGNOSIS — Z79891 Long term (current) use of opiate analgesic: Secondary | ICD-10-CM

## 2019-01-17 DIAGNOSIS — Z961 Presence of intraocular lens: Secondary | ICD-10-CM | POA: Diagnosis present

## 2019-01-17 DIAGNOSIS — R0989 Other specified symptoms and signs involving the circulatory and respiratory systems: Secondary | ICD-10-CM | POA: Diagnosis not present

## 2019-01-17 DIAGNOSIS — Z7951 Long term (current) use of inhaled steroids: Secondary | ICD-10-CM

## 2019-01-17 DIAGNOSIS — I5031 Acute diastolic (congestive) heart failure: Secondary | ICD-10-CM | POA: Diagnosis not present

## 2019-01-17 DIAGNOSIS — G2581 Restless legs syndrome: Secondary | ICD-10-CM | POA: Diagnosis present

## 2019-01-17 DIAGNOSIS — Z841 Family history of disorders of kidney and ureter: Secondary | ICD-10-CM

## 2019-01-17 DIAGNOSIS — Z20828 Contact with and (suspected) exposure to other viral communicable diseases: Secondary | ICD-10-CM | POA: Diagnosis present

## 2019-01-17 DIAGNOSIS — Z825 Family history of asthma and other chronic lower respiratory diseases: Secondary | ICD-10-CM

## 2019-01-17 DIAGNOSIS — Z79890 Hormone replacement therapy (postmenopausal): Secondary | ICD-10-CM

## 2019-01-17 DIAGNOSIS — Z7984 Long term (current) use of oral hypoglycemic drugs: Secondary | ICD-10-CM

## 2019-01-17 DIAGNOSIS — M10072 Idiopathic gout, left ankle and foot: Secondary | ICD-10-CM | POA: Diagnosis not present

## 2019-01-17 DIAGNOSIS — I2510 Atherosclerotic heart disease of native coronary artery without angina pectoris: Secondary | ICD-10-CM | POA: Diagnosis present

## 2019-01-17 DIAGNOSIS — Z85850 Personal history of malignant neoplasm of thyroid: Secondary | ICD-10-CM

## 2019-01-17 LAB — URINALYSIS, ROUTINE W REFLEX MICROSCOPIC
Glucose, UA: NEGATIVE mg/dL
Hgb urine dipstick: NEGATIVE
Ketones, ur: NEGATIVE mg/dL
Nitrite: POSITIVE — AB
Protein, ur: 100 mg/dL — AB
Specific Gravity, Urine: 1.025 (ref 1.005–1.030)
pH: 8 (ref 5.0–8.0)

## 2019-01-17 LAB — CBC WITH DIFFERENTIAL/PLATELET
Abs Immature Granulocytes: 0.21 10*3/uL — ABNORMAL HIGH (ref 0.00–0.07)
Basophils Absolute: 0.1 10*3/uL (ref 0.0–0.1)
Basophils Relative: 0 %
Eosinophils Absolute: 0 10*3/uL (ref 0.0–0.5)
Eosinophils Relative: 0 %
HCT: 37.6 % — ABNORMAL LOW (ref 39.0–52.0)
Hemoglobin: 12.1 g/dL — ABNORMAL LOW (ref 13.0–17.0)
Immature Granulocytes: 1 %
Lymphocytes Relative: 4 %
Lymphs Abs: 0.8 10*3/uL (ref 0.7–4.0)
MCH: 29.4 pg (ref 26.0–34.0)
MCHC: 32.2 g/dL (ref 30.0–36.0)
MCV: 91.5 fL (ref 80.0–100.0)
Monocytes Absolute: 1.2 10*3/uL — ABNORMAL HIGH (ref 0.1–1.0)
Monocytes Relative: 6 %
Neutro Abs: 17.4 10*3/uL — ABNORMAL HIGH (ref 1.7–7.7)
Neutrophils Relative %: 89 %
Platelets: 220 10*3/uL (ref 150–400)
RBC: 4.11 MIL/uL — ABNORMAL LOW (ref 4.22–5.81)
RDW: 15.2 % (ref 11.5–15.5)
WBC: 19.7 10*3/uL — ABNORMAL HIGH (ref 4.0–10.5)
nRBC: 0 % (ref 0.0–0.2)

## 2019-01-17 LAB — COMPREHENSIVE METABOLIC PANEL
ALT: 13 U/L (ref 0–44)
AST: 28 U/L (ref 15–41)
Albumin: 3.7 g/dL (ref 3.5–5.0)
Alkaline Phosphatase: 48 U/L (ref 38–126)
Anion gap: 13 (ref 5–15)
BUN: 24 mg/dL — ABNORMAL HIGH (ref 8–23)
CO2: 29 mmol/L (ref 22–32)
Calcium: 8.9 mg/dL (ref 8.9–10.3)
Chloride: 99 mmol/L (ref 98–111)
Creatinine, Ser: 0.98 mg/dL (ref 0.61–1.24)
GFR calc Af Amer: >60 mL/min (ref >60)
GFR calc non Af Amer: >60 mL/min (ref >60)
Glucose, Bld: 100 mg/dL — ABNORMAL HIGH (ref 70–99)
Potassium: 2.9 mmol/L — ABNORMAL LOW (ref 3.5–5.1)
Sodium: 141 mmol/L (ref 135–145)
Total Bilirubin: 0.8 mg/dL (ref 0.3–1.2)
Total Protein: 7.2 g/dL (ref 6.5–8.1)

## 2019-01-17 LAB — CBC
HCT: 35.6 % — ABNORMAL LOW (ref 39.0–52.0)
Hemoglobin: 10.9 g/dL — ABNORMAL LOW (ref 13.0–17.0)
MCH: 28.5 pg (ref 26.0–34.0)
MCHC: 30.6 g/dL (ref 30.0–36.0)
MCV: 93 fL (ref 80.0–100.0)
Platelets: 198 10*3/uL (ref 150–400)
RBC: 3.83 MIL/uL — ABNORMAL LOW (ref 4.22–5.81)
RDW: 15.1 % (ref 11.5–15.5)
WBC: 14.9 10*3/uL — ABNORMAL HIGH (ref 4.0–10.5)
nRBC: 0 % (ref 0.0–0.2)

## 2019-01-17 LAB — BASIC METABOLIC PANEL
Anion gap: 10 (ref 5–15)
BUN: 25 mg/dL — ABNORMAL HIGH (ref 8–23)
CO2: 32 mmol/L (ref 22–32)
Calcium: 8.5 mg/dL — ABNORMAL LOW (ref 8.9–10.3)
Chloride: 100 mmol/L (ref 98–111)
Creatinine, Ser: 1.03 mg/dL (ref 0.61–1.24)
GFR calc Af Amer: >60 mL/min (ref >60)
GFR calc non Af Amer: >60 mL/min (ref >60)
Glucose, Bld: 107 mg/dL — ABNORMAL HIGH (ref 70–99)
Potassium: 3.6 mmol/L (ref 3.5–5.1)
Sodium: 142 mmol/L (ref 135–145)

## 2019-01-17 LAB — MAGNESIUM: Magnesium: 2.1 mg/dL (ref 1.7–2.4)

## 2019-01-17 LAB — APTT: aPTT: 32 seconds (ref 24–36)

## 2019-01-17 LAB — LACTIC ACID, PLASMA
Lactic Acid, Venous: 3.3 mmol/L (ref 0.5–1.9)
Lactic Acid, Venous: 2.4 mmol/L (ref 0.5–1.9)
Lactic Acid, Venous: 2 mmol/L (ref 0.5–1.9)
Lactic Acid, Venous: 1.5 mmol/L (ref 0.5–1.9)

## 2019-01-17 LAB — PROTIME-INR
INR: 1.1 (ref 0.8–1.2)
Prothrombin Time: 14.2 seconds (ref 11.4–15.2)

## 2019-01-17 LAB — SARS CORONAVIRUS 2 BY RT PCR (HOSPITAL ORDER, PERFORMED IN ~~LOC~~ HOSPITAL LAB): SARS Coronavirus 2: NEGATIVE

## 2019-01-17 IMAGING — DX PORTABLE CHEST - 1 VIEW
1 series · 1 of 1 positions shown · non-contrast
Comparison: PA and lateral chest [DATE].  CT chest [DATE].

CLINICAL DATA: Shortness of breath and congestion

EXAM:
PORTABLE CHEST 1 VIEW

[chest ap]
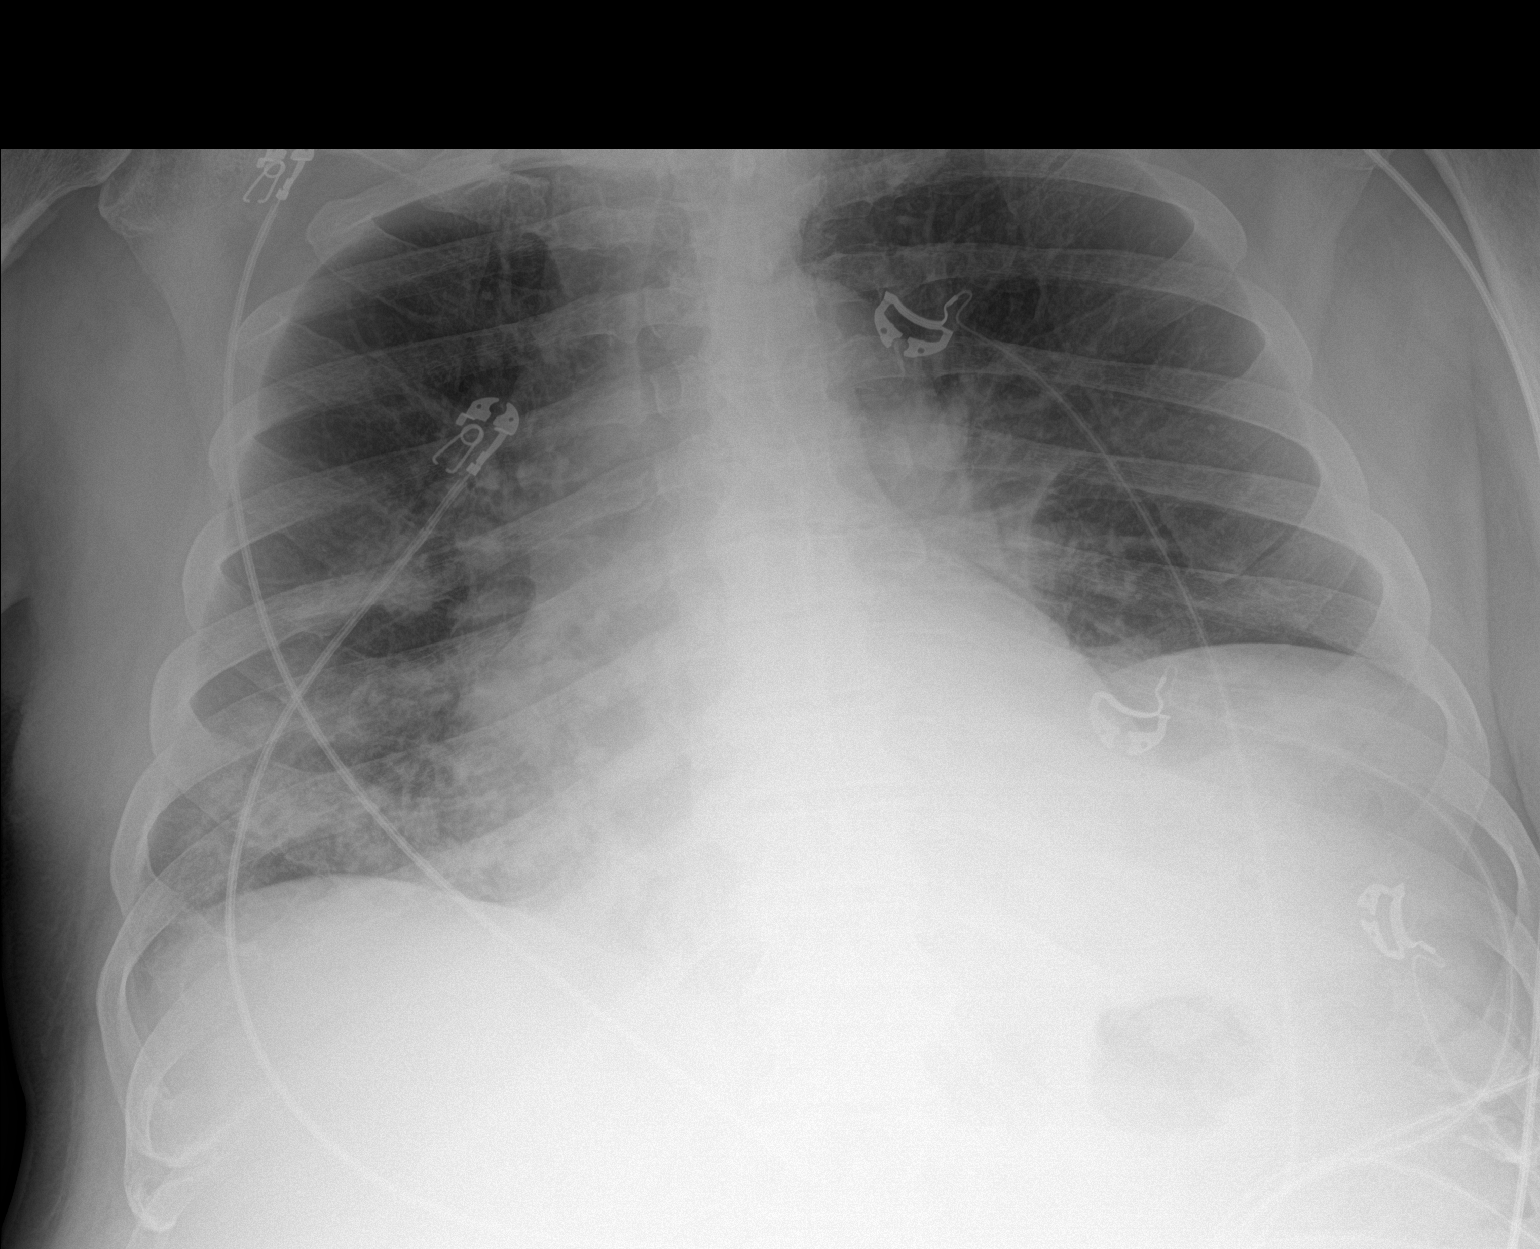

[1 of 1 positions shown; findings below may reference images not displayed]

FINDINGS: Patchy airspace disease is seen in the right mid and lower lung
zones. Left lung is clear. Heart size is normal. No pneumothorax or
pleural effusion. No acute or focal bony abnormality.
IMPRESSION: Patchy airspace disease in the right mid and lower lung zones most
consistent with pneumonia.

## 2019-01-17 MED ORDER — ADULT MULTIVITAMIN W/MINERALS CH
1.0000 | ORAL_TABLET | Freq: Every day | ORAL | Status: DC
  Administered 2019-01-17 – 2019-01-28 (×12): 1 via ORAL
  Filled 2019-01-17 (×12): qty 1

## 2019-01-17 MED ORDER — ACETAMINOPHEN 325 MG PO TABS
650.0000 mg | ORAL_TABLET | Freq: Four times a day (QID) | ORAL | Status: DC | PRN
  Administered 2019-01-17 – 2019-01-20 (×5): 650 mg via ORAL
  Filled 2019-01-17 (×6): qty 2

## 2019-01-17 MED ORDER — POLYETHYLENE GLYCOL 3350 17 G PO PACK
17.0000 g | PACK | ORAL | Status: DC
  Administered 2019-01-20: 17 g via ORAL
  Filled 2019-01-17 (×3): qty 1

## 2019-01-17 MED ORDER — LEVOTHYROXINE SODIUM 100 MCG PO TABS
200.0000 ug | ORAL_TABLET | Freq: Every day | ORAL | Status: DC
  Administered 2019-01-18 – 2019-01-28 (×11): 200 ug via ORAL
  Filled 2019-01-17 (×11): qty 2

## 2019-01-17 MED ORDER — ACETAMINOPHEN 325 MG PO TABS
650.0000 mg | ORAL_TABLET | Freq: Once | ORAL | Status: AC
  Administered 2019-01-17: 650 mg via ORAL
  Filled 2019-01-17: qty 2

## 2019-01-17 MED ORDER — SODIUM CHLORIDE 0.9 % IV SOLN
500.0000 mg | INTRAVENOUS | Status: DC
  Administered 2019-01-17 – 2019-01-18 (×2): 500 mg via INTRAVENOUS
  Filled 2019-01-17 (×2): qty 500

## 2019-01-17 MED ORDER — LACTATED RINGERS IV BOLUS
1000.0000 mL | Freq: Once | INTRAVENOUS | Status: AC
  Administered 2019-01-17: 1000 mL via INTRAVENOUS

## 2019-01-17 MED ORDER — PANTOPRAZOLE SODIUM 40 MG PO TBEC
40.0000 mg | DELAYED_RELEASE_TABLET | Freq: Every evening | ORAL | Status: DC
  Administered 2019-01-17 – 2019-01-27 (×10): 40 mg via ORAL
  Filled 2019-01-17 (×10): qty 1

## 2019-01-17 MED ORDER — SALINE SPRAY 0.65 % NA SOLN
1.0000 | NASAL | Status: DC | PRN
  Administered 2019-01-17 – 2019-01-22 (×2): 1 via NASAL
  Filled 2019-01-17: qty 44

## 2019-01-17 MED ORDER — SIMVASTATIN 40 MG PO TABS
40.0000 mg | ORAL_TABLET | Freq: Every day | ORAL | Status: DC
  Administered 2019-01-19 – 2019-01-27 (×9): 40 mg via ORAL
  Filled 2019-01-17 (×9): qty 1

## 2019-01-17 MED ORDER — ACETAMINOPHEN 650 MG RE SUPP: 650.0000 mg | Freq: Four times a day (QID) | RECTAL | Status: DC | PRN

## 2019-01-17 MED ORDER — POTASSIUM CHLORIDE CRYS ER 20 MEQ PO TBCR
40.0000 meq | EXTENDED_RELEASE_TABLET | Freq: Once | ORAL | Status: AC
  Administered 2019-01-17: 40 meq via ORAL
  Filled 2019-01-17: qty 2

## 2019-01-17 MED ORDER — SODIUM CHLORIDE 0.9 % IV SOLN
2.0000 g | INTRAVENOUS | Status: AC
  Administered 2019-01-17 – 2019-01-25 (×9): 2 g via INTRAVENOUS
  Filled 2019-01-17 (×6): qty 2
  Filled 2019-01-17: qty 20
  Filled 2019-01-17 (×2): qty 2

## 2019-01-17 MED ORDER — ONDANSETRON HCL 4 MG PO TABS: 4.0000 mg | ORAL_TABLET | Freq: Four times a day (QID) | ORAL | Status: DC | PRN

## 2019-01-17 MED ORDER — COLCHICINE 0.6 MG PO TABS
0.6000 mg | ORAL_TABLET | Freq: Every day | ORAL | Status: DC
  Administered 2019-01-17 – 2019-01-28 (×12): 0.6 mg via ORAL
  Filled 2019-01-17 (×12): qty 1

## 2019-01-17 MED ORDER — CARBIDOPA-LEVODOPA 10-100 MG PO TABS
1.0000 | ORAL_TABLET | Freq: Every day | ORAL | Status: DC
  Administered 2019-01-18 – 2019-01-28 (×11): 1 via ORAL
  Filled 2019-01-17 (×11): qty 1

## 2019-01-17 MED ORDER — UMECLIDINIUM BROMIDE 62.5 MCG/INH IN AEPB
1.0000 | INHALATION_SPRAY | Freq: Every day | RESPIRATORY_TRACT | Status: DC
  Filled 2019-01-17: qty 7

## 2019-01-17 MED ORDER — IPRATROPIUM-ALBUTEROL 0.5-2.5 (3) MG/3ML IN SOLN
3.0000 mL | Freq: Three times a day (TID) | RESPIRATORY_TRACT | Status: DC
  Administered 2019-01-18 – 2019-01-28 (×30): 3 mL via RESPIRATORY_TRACT
  Filled 2019-01-17 (×31): qty 3

## 2019-01-17 MED ORDER — NITROGLYCERIN 0.4 MG SL SUBL: 0.4000 mg | SUBLINGUAL_TABLET | SUBLINGUAL | Status: DC | PRN

## 2019-01-17 MED ORDER — BUDESONIDE 0.25 MG/2ML IN SUSP
0.2500 mg | Freq: Two times a day (BID) | RESPIRATORY_TRACT | Status: DC
  Administered 2019-01-17 – 2019-01-28 (×22): 0.25 mg via RESPIRATORY_TRACT
  Filled 2019-01-17 (×22): qty 2

## 2019-01-17 MED ORDER — FLUTICASONE-UMECLIDIN-VILANT 100-62.5-25 MCG/INH IN AEPB: 1.0000 | INHALATION_SPRAY | Freq: Every day | RESPIRATORY_TRACT | Status: DC

## 2019-01-17 MED ORDER — LATANOPROST 0.005 % OP SOLN
1.0000 [drp] | Freq: Every day | OPHTHALMIC | Status: DC
  Administered 2019-01-17 – 2019-01-27 (×10): 1 [drp] via OPHTHALMIC
  Filled 2019-01-17 (×2): qty 2.5

## 2019-01-17 MED ORDER — BACLOFEN 10 MG PO TABS
10.0000 mg | ORAL_TABLET | Freq: Three times a day (TID) | ORAL | Status: DC
  Administered 2019-01-17 – 2019-01-28 (×32): 10 mg via ORAL
  Filled 2019-01-17 (×32): qty 1

## 2019-01-17 MED ORDER — IPRATROPIUM-ALBUTEROL 0.5-2.5 (3) MG/3ML IN SOLN: 3.0000 mL | Freq: Four times a day (QID) | RESPIRATORY_TRACT | Status: DC

## 2019-01-17 MED ORDER — FLUTICASONE FUROATE-VILANTEROL 100-25 MCG/INH IN AEPB
1.0000 | INHALATION_SPRAY | Freq: Every day | RESPIRATORY_TRACT | Status: DC
  Filled 2019-01-17: qty 28

## 2019-01-17 MED ORDER — POTASSIUM CHLORIDE CRYS ER 20 MEQ PO TBCR
20.0000 meq | EXTENDED_RELEASE_TABLET | Freq: Once | ORAL | Status: AC
  Administered 2019-01-17: 20 meq via ORAL
  Filled 2019-01-17: qty 1

## 2019-01-17 MED ORDER — HYDROCODONE-ACETAMINOPHEN 5-325 MG PO TABS
1.0000 | ORAL_TABLET | Freq: Every day | ORAL | Status: DC | PRN
  Administered 2019-01-20 – 2019-01-27 (×5): 1 via ORAL
  Filled 2019-01-17 (×6): qty 1

## 2019-01-17 MED ORDER — SODIUM CHLORIDE 0.9 % IV SOLN
INTRAVENOUS | Status: DC
  Administered 2019-01-17 – 2019-01-18 (×2): via INTRAVENOUS

## 2019-01-17 MED ORDER — ONDANSETRON HCL 4 MG/2ML IJ SOLN: 4.0000 mg | Freq: Four times a day (QID) | INTRAMUSCULAR | Status: DC | PRN

## 2019-01-17 MED ORDER — GUAIFENESIN ER 600 MG PO TB12
600.0000 mg | ORAL_TABLET | Freq: Two times a day (BID) | ORAL | Status: DC
  Administered 2019-01-17 – 2019-01-28 (×21): 600 mg via ORAL
  Filled 2019-01-17 (×21): qty 1

## 2019-01-17 MED ORDER — ENOXAPARIN SODIUM 30 MG/0.3ML ~~LOC~~ SOLN
30.0000 mg | SUBCUTANEOUS | Status: DC
  Administered 2019-01-17: 30 mg via SUBCUTANEOUS
  Filled 2019-01-17: qty 0.3

## 2019-01-17 MED ORDER — ALLOPURINOL 300 MG PO TABS
300.0000 mg | ORAL_TABLET | Freq: Every day | ORAL | Status: DC
  Administered 2019-01-17 – 2019-01-28 (×12): 300 mg via ORAL
  Filled 2019-01-17: qty 3
  Filled 2019-01-17: qty 1
  Filled 2019-01-17 (×2): qty 3
  Filled 2019-01-17 (×2): qty 1
  Filled 2019-01-17: qty 3
  Filled 2019-01-17: qty 1
  Filled 2019-01-17: qty 3
  Filled 2019-01-17: qty 1
  Filled 2019-01-17: qty 3
  Filled 2019-01-17: qty 1

## 2019-01-17 MED ORDER — METOPROLOL SUCCINATE ER 25 MG PO TB24
12.5000 mg | ORAL_TABLET | Freq: Two times a day (BID) | ORAL | Status: DC
  Administered 2019-01-17 – 2019-01-26 (×19): 12.5 mg via ORAL
  Filled 2019-01-17 (×20): qty 1

## 2019-01-17 MED ORDER — IPRATROPIUM-ALBUTEROL 0.5-2.5 (3) MG/3ML IN SOLN
3.0000 mL | Freq: Four times a day (QID) | RESPIRATORY_TRACT | Status: DC
  Administered 2019-01-17: 3 mL via RESPIRATORY_TRACT
  Filled 2019-01-17: qty 3

## 2019-01-17 NOTE — H&P (Addendum)
History and Physical  [name] KZL:[id number] DOB: 02-16-1941 DOA: 01/17/2019  PCP: [name], MD Patient coming from: Home  I have personally briefly reviewed patient's old medical records in [place]   Chief Complaint: Worsening cough and shortness of breath  HPI: [name] is a 78 y.o. 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  He is relate that he has not been able to sleep because of the constant cough.  He report productive cough milky phlegm.  He has shortness of breath for years but has been worse for the last few days. He denies fevers. He denies abdominal pain, nausea vomiting or diarrhea.  He denies dysuria.  Evaluation in the ED: Patient with a temperature 100.7, tachypneic respiration rate 28, blood pressure 98/57, oxygen saturation 82 on room air.  He is currently on 3 L of oxygen.  Labs sodium 141, potassium 2.9, BUN 24, creatinine 1.98, magnesium 2.1, liver function test normal, lactic acid 3.3, white blood cell 19.7, C 17, hemoglobin 12.1 UA 21-50 white blood cell, positive nitrates. Chest x-ray:Patchy airspace disease in the right mid and lower lung zones most consistent with pneumonia.  Review of Systems: All systems reviewed and apart from history of presenting illness, are negative.  Past Medical History:  Diagnosis Date  . Arthritis   . Barrett esophagus   . Bronchitis   . CAD (coronary artery disease)    pt's wife denies any cardiac history on pt.  . Cancer ([place])    thyroid  . Carotid artery occlusion   . Constipation   . COPD (chronic obstructive pulmonary disease) ([place])   . Diabetes mellitus without complication ([place])   . Dyspnea   . GERD (gastroesophageal reflux disease)   . Headache    migraines  . History of thyroid  cancer 2008  . History of thyroid cancer   . Hypertension   . OSA (obstructive sleep apnea)   . Pneumonia   . Restless leg   . S/P tendon repair    left thumb  . Sleep apnea with use of continuous positive airway pressure (CPAP)    2011 [facility] , [facility]  77cn central and obstrcutive. 16 cm water , 3 cm EPR.   . Thoracic ascending aortic aneurysm (HCC)    4.2 cm ascending TAA 08/18/17 CTA. Annual imaging recommended.  . Thyroid disease   . Ulcer    Peptic ulcer disease   Past Surgical History:  Procedure Laterality Date  . ANTERIOR CERVICAL DECOMP/DISCECTOMY FUSION N/A 09/03/2017   Procedure: ACDF - C3-C4 - C4-C5 - C5-C6;  Surgeon: Kary Kos, MD;  Location: [facility];  Service: Neurosurgery;  Laterality: N/A;  . CAROTID ENDARTERECTOMY Left January 03, 2006   Dr. Amedeo Plenty  . COLONOSCOPY    . EYE SURGERY Bilateral    cataract surgery with lens implants  . game keepers thumb Right   . JOINT REPLACEMENT Left    knee X 2  . ROTATOR CUFF REPAIR Right   . THYROIDECTOMY  2008   Social History:  reports that he quit smoking about 13 years ago. His smoking use included cigars and cigarettes. He quit after 40.00 years of use. He has never used smokeless tobacco. He reports that he does not drink alcohol or use drugs.  No Known Allergies  Family History  Problem Relation Age of Onset  . COPD Mother   . Hyperlipidemia Mother   . Hypertension Mother   . Diabetes Father   . Kidney disease Father        ESRD  . Hypertension Father   . Cancer Father   . Hyperlipidemia Father     Prior to Admission medications   Medication Sig Start Date End Date Taking? Authorizing Provider  acetaminophen (TYLENOL) 325 MG tablet Take 2 tablets (650 mg total) by mouth every 4 (four) hours as needed for mild pain ((score 1 to 3) or temp > 100.5). 09/06/17  Yes Kary Kos, MD  allopurinol (ZYLOPRIM) 300 MG tablet Take 300 mg by mouth daily.  06/02/15  Yes [provider]  baclofen (LIORESAL) 10 MG  tablet Take 1-1.5 tablets (10-15 mg total) by mouth 3 (three) times daily. Patient taking differently: Take 10 mg by mouth 3 (three) times daily.  05/20/18  Yes Dohmeier, Asencion Partridge, MD  calcium citrate-vitamin D (CITRACAL+D) 315-200 MG-UNIT per tablet Take 1 tablet by mouth daily. 750 mg    Yes [provider]  carbidopa-levodopa (SINEMET IR) 10-100 MG tablet Take 1 tablet by mouth 2 (two) times daily. Patient taking differently: Take 1 tablet by mouth daily.  10/30/16  Yes Dohmeier, Asencion Partridge, MD  cholecalciferol (VITAMIN D) 1000 units tablet Take 1,000 Units by mouth daily.    Yes [provider]  cloNIDine (CATAPRES) 0.1 MG tablet Take 0.1 mg by mouth 2 (two) times daily. 10/09/16  Yes [provider]  colchicine 0.6 MG tablet Take 0.6 mg by mouth daily.   Yes [provider]  guaifenesin (HUMIBID E) 400 MG TABS tablet Take 400 mg by mouth 2 (two) times daily as needed (congestion).   Yes [provider]  HYDROcodone-acetaminophen (NORCO/VICODIN) 5-325 MG tablet Take 1 tablet by mouth 3 (three) times daily as needed (pain). Patient taking differently: Take 1 tablet by mouth daily as needed for moderate pain (pain).  09/06/17  Yes Kary Kos, MD  ipratropium-albuterol (DUONEB) 0.5-2.5 (3) MG/3ML SOLN Take 3 mLs by nebulization every 6 (six) hours as needed (cough).   Yes [provider]  levothyroxine (SYNTHROID, LEVOTHROID) 200 MCG tablet Take 200 mcg by mouth daily before breakfast.    Yes [provider]  losartan-hydrochlorothiazide (HYZAAR) 100-25 MG per tablet Take 1 tablet by mouth daily. 11/04/12  Yes [provider]  metFORMIN (GLUCOPHAGE) 500 MG tablet Take 1 tablet (500 mg total) by mouth 2 (two) times daily with a meal. 08/28/17  Yes Patrecia Pour, Christean Grief, MD  metoprolol succinate (TOPROL-XL) 50 MG 24 hr tablet Take 50 mg by mouth 2 (two) times daily. Take with or immediately following a meal.   Yes [provider]   Multiple Vitamin (MULTIVITAMIN) tablet Take 1 tablet by mouth daily.   Yes [provider]  nitroGLYCERIN (NITROSTAT) 0.4 MG SL tablet Place 0.4 mg every 5 (five) minutes as needed under the tongue for chest pain.   Yes [provider]  oxymetazoline (AFRIN) 0.05 % nasal spray Place 2 sprays into the nose 2 (two) times daily as needed for congestion.    Yes [provider]  pantoprazole (PROTONIX) 40 MG tablet Take 40 mg by mouth every evening.   Yes [provider]  polyethylene glycol (MIRALAX / GLYCOLAX) packet Take 17 g by mouth daily as needed for moderate constipation. Patient taking differently: Take 17 g by mouth every other day.  09/06/17  Yes Kary Kos, MD  rizatriptan (MAXALT) 10 MG tablet Take 10 mg by mouth as needed for migraine.  08/08/18  Yes [provider]  simvastatin (ZOCOR) 40 MG tablet Take 40 mg by mouth daily at 6 PM.  08/07/17  Yes [provider]  TRAVATAN Z 0.004 % SOLN ophthalmic solution Place 1 drop into both eyes at bedtime.  07/25/13  Yes [provider]  TRELEGY ELLIPTA 100-62.5-25 MCG/INH AEPB Inhale 1 puff into the lungs daily.  05/01/18  Yes [provider]  VENTOLIN HFA 108 (90 Base) MCG/ACT inhaler Inhale 1-2 puffs into the lungs every 4 (four) hours as needed for wheezing or shortness of breath.  07/17/18  Yes [provider]   Physical Exam: Vitals:   01/17/19 1500 01/17/19 1530 01/17/19 1600 01/17/19 1628  BP: 115/69 115/74 123/74 119/76  Pulse: 85 85 85 89  Resp: 20 20 17    Temp:    98.2 F (36.8 C)  TempSrc:    Oral  SpO2: 96% 96% 97% 96%  Height:    5\' 7"  (1.702 m)     General exam: Moderately built and nourished patient, lying comfortably supine on the gurney in no obvious distress.  Ill appearing  Head, eyes and ENT: Nontraumatic and normocephalic. Pupils equally reacting to light and accommodation. Oral mucosa moist.  Neck: Supple. No JVD, carotid bruit or  thyromegaly.  Lymphatics: No lymphadenopathy.  Respiratory system: Tachypneic, bilateral rhonchorous  Cardiovascular system: S1 and S2 heard, RRR. No JVD, murmurs, gallops, clicks or pedal edema.  Gastrointestinal system: Abdomen is distended, obese, soft and nontender. Normal bowel sounds heard. No organomegaly or masses appreciated.  Central nervous system: Alert and oriented.  He is able to move all 4 extremities  Extremities: . Peripheral pulses symmetrically felt.   Skin: Mild redness lower extremities, mild edema  Musculoskeletal system: Negative exam.  Psychiatry: Pleasant and cooperative.   Labs on Admission:  Basic Metabolic Panel: Recent Labs  Lab 01/17/19 1112  NA 141  K 2.9*  CL 99  CO2 29  GLUCOSE 100*  BUN 24*  CREATININE 0.98  CALCIUM 8.9  MG 2.1   Liver Function Tests: Recent Labs  Lab 01/17/19 1112  AST 28  ALT 13  ALKPHOS 48  BILITOT 0.8  PROT 7.2  ALBUMIN 3.7   No results for input(s): LIPASE, AMYLASE in the last 168 hours. No results for input(s): AMMONIA in the last 168 hours. CBC: Recent Labs  Lab 01/17/19 1112  WBC 19.7*  NEUTROABS 17.4*  HGB 12.1*  HCT 37.6*  MCV 91.5  PLT 220   Cardiac Enzymes: No results for input(s): CKTOTAL, CKMB, CKMBINDEX, TROPONINI in the last 168 hours.  BNP (last 3 results) No results for input(s): PROBNP in the last 8760 hours. CBG: No results for input(s): GLUCAP in the last 168 hours.  Radiological Exams on Admission: Dg Chest Port 1 View  Result Date: 01/17/2019 CLINICAL DATA:  Shortness of breath and congestion EXAM: PORTABLE CHEST 1 VIEW COMPARISON:  PA and lateral chest 08/19/2017.  CT chest 08/18/2017. FINDINGS: Patchy airspace disease is seen in the right mid and lower lung zones. Left lung is clear. Heart size is normal. No pneumothorax or pleural effusion. No acute or focal bony abnormality. IMPRESSION: Patchy airspace disease in the right mid and lower lung zones most consistent with  pneumonia. Electronically Signed   By: Inge Rise M.D.   On: 01/17/2019 10:57    EKG: Independently reviewed.  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  Lactic acidosis with lactic acid at 3.3. -Patient will be admitted to telemetry bed. -Continue with IV fluids -Continue with ceftriaxone and azithromycin. -Follow blood cultures.  2-Acute hypoxic respiratory failure: Secondary to pneumonia. New oxygen requirement, patient present with oxygen saturation 82 on room air. Currently on 3 L of oxygen. Continue with antibiotics and nebulizer.  3-COPD: Continue with DuoNeb and  home inhale  4-History of gout: Continue with colchicine and allopurinol.  5-Restless leg syndrome, myoclonus; Continue with carbidopa levodopa. Continue with baclofen  6-Hypertension: We will hold clonidine for now due to sepsis. I have changed metoprolol to 12.5 twice daily to avoid hypotension.  Might need to resume home dose when blood pressure allows  7-Hypokalemia: Replete orally Normal magnesium level.  8-pyuria: Already on IV antibiotics.  Follow urine culture.    DVT Prophylaxis: Lovenox Code Status: Patient wishes to be a DNR Family Communication: Care discussed with patient and wife over the phone. Disposition Plan: Admit inpatient for treatment of sepsis, acute hypoxic respiratory failure and pneumonia.  Time spent: 75 minutes     It is my clinical opinion that admission to [place]  is reasonable and necessary in this 78 y.o. male . presenting with symptoms of infection, fever, tachypnea, hypoxemia, cough, concerning for sepsis, acute hypoxic respiratory failure, pneumonia . in the context of PMH including: COPD, OSA, hypertension. . with pertinent positives on physical exam including: Bilateral ronchus  lung exam..  . and pertinent positives on radiographic and laboratory data including: Infiltrates chest x-ray, leukocytosis, hypokalemia, increased lactic acid at 3.3 . Workup and treatment include ; x-ray, IV fluids, IV antibiotics oxygen supplementation  Given the aforementioned, the predictability of an adverse outcome is felt to be significant. I expect that the patient will require at least 2 midnights in the hospital to treat this condition.  Elmarie Shiley MD [facility]   01/17/2019, 4:48 PM

## 2019-01-17 NOTE — ED Notes (Signed)
ED TO INPATIENT HANDOFF REPORT  Name/Age/Gender [name] 78 y.o. male  Code Status Code Status History    Date Active Date Inactive Code Status Order ID Comments User Context   09/03/2017 2158 09/06/2017 2114 Full Code [id number]  Kary Kos, MD Inpatient   08/18/2017 1708 08/23/2017 1938 Full Code [id number]  Lady Deutscher, MD ED   06/20/2017 1707 06/23/2017 1837 DNR [id number]  Modena Jansky, MD ED   Advance Care Planning Activity      Home/SNF/Other    Chief Complaint FEVER  SHORT OF BREATH   Level of Care/Admitting Diagnosis ED Disposition    ED Disposition Condition [facility] Area: [place] [[id number]]  Level of Care: Telemetry [5]  Admit to tele based on following criteria: Monitor QTC interval  Covid Evaluation: Confirmed COVID Negative  Diagnosis: Pneumonia [227785]  Admitting Physician: Elmarie Shiley [phone]  Attending Physician: Elmarie Shiley [phone]  Estimated length of stay: 3 - 4 days  Certification:: I certify this patient will need inpatient services for at least 2 midnights  PT Class (Do Not Modify): Inpatient [101]  PT Acc Code (Do Not Modify): Private [1]       Medical History Past Medical History:  Diagnosis Date  . Arthritis   . Barrett esophagus   . Bronchitis   . CAD (coronary artery disease)    pt's wife denies any cardiac history on pt.  . Cancer ([place])    thyroid  . Carotid artery occlusion   . Constipation   . COPD (chronic obstructive pulmonary disease) ([place])   . Diabetes mellitus without complication ([place])   . Dyspnea   . GERD (gastroesophageal reflux disease)   . Headache    migraines  . History of thyroid cancer 2008  . History of thyroid cancer   . Hypertension   . OSA (obstructive sleep apnea)   . Pneumonia   . Restless leg   . S/P tendon repair    left thumb  . Sleep apnea with use of continuous positive airway pressure (CPAP)    2011 [facility] , AHI  77cn central and  obstrcutive. 16 cm water , 3 cm EPR.   . Thoracic ascending aortic aneurysm (HCC)    4.2 cm ascending TAA 08/18/17 CTA. Annual imaging recommended.  . Thyroid disease   . Ulcer    Peptic ulcer disease    Allergies No Known Allergies  IV Location/Drains/Wounds Patient Lines/Drains/Airways Status   Active Line/Drains/Airways    Name:   Placement date:   Placement time:   Site:   Days:   Peripheral IV 01/17/19 Left Wrist   01/17/19    1113    Wrist   less than 1   Peripheral IV 01/17/19 Right Forearm   01/17/19    1113    Forearm   less than 1   External Urinary Catheter   09/04/17    0640    -   500   Incision (Closed) 09/03/17 Neck Other (Comment)   09/03/17    1417     501   Incision (Closed) 09/03/17 Neck Other (Comment)   09/03/17    1721     501          Labs/Imaging Results for orders placed or performed during the hospital encounter of 01/17/19 (from the past 48 hour(s))  Lactic acid, plasma     Status: Abnormal   Collection Time: 01/17/19 11:12 AM  Result  Value Ref Range   Lactic Acid, Venous 3.3 (HH) 0.5 - 1.9 mmol/L    Comment: CRITICAL RESULT CALLED TO, READ BACK BY AND VERIFIED WITH: Herbie Baltimore, RN @ [phone] ON 01/17/2019 Sandy Salaam  Performed at [facility], [place] [address] [place], [place] [address]   Comprehensive metabolic panel     Status: Abnormal   Collection Time: 01/17/19 11:12 AM  Result Value Ref Range   Sodium 141 135 - 145 mmol/L   Potassium 2.9 (L) 3.5 - 5.1 mmol/L   Chloride 99 98 - 111 mmol/L   CO2 29 22 - 32 mmol/L   Glucose, Bld 100 (H) 70 - 99 mg/dL   BUN 24 (H) 8 - 23 mg/dL   Creatinine, Ser 0.98 0.61 - 1.24 mg/dL   Calcium 8.9 8.9 - 10.3 mg/dL   Total Protein 7.2 6.5 - 8.1 g/dL   Albumin 3.7 3.5 - 5.0 g/dL   AST 28 15 - 41 U/L   ALT 13 0 - 44 U/L   Alkaline Phosphatase 48 38 - 126 U/L   Total Bilirubin 0.8 0.3 - 1.2 mg/dL   GFR calc non Af Amer >60 >60 mL/min   GFR calc Af Amer >60 >60 mL/min   Anion gap 13 5 - 15     Comment: Performed at [facility], [place] [address] [place], [place] [address]  CBC WITH DIFFERENTIAL     Status: Abnormal   Collection Time: 01/17/19 11:12 AM  Result Value Ref Range   WBC 19.7 (H) 4.0 - 10.5 K/uL   RBC 4.11 (L) 4.22 - 5.81 MIL/uL   Hemoglobin 12.1 (L) 13.0 - 17.0 g/dL   HCT 37.6 (L) 39.0 - 52.0 %   MCV 91.5 80.0 - 100.0 fL   MCH 29.4 26.0 - 34.0 pg   MCHC 32.2 30.0 - 36.0 g/dL   RDW 15.2 11.5 - 15.5 %   Platelets 220 150 - 400 K/uL   nRBC 0.0 0.0 - 0.2 %   Neutrophils Relative % 89 %   Neutro Abs 17.4 (H) 1.7 - 7.7 K/uL   Lymphocytes Relative 4 %   Lymphs Abs 0.8 0.7 - 4.0 K/uL   Monocytes Relative 6 %   Monocytes Absolute 1.2 (H) 0.1 - 1.0 K/uL   Eosinophils Relative 0 %   Eosinophils Absolute 0.0 0.0 - 0.5 K/uL   Basophils Relative 0 %   Basophils Absolute 0.1 0.0 - 0.1 K/uL   WBC Morphology MILD LEFT SHIFT (1-5% METAS, OCC MYELO, OCC BANDS)     Comment: TOXIC GRANULATION   Immature Granulocytes 1 %   Abs Immature Granulocytes 0.21 (H) 0.00 - 0.07 K/uL    Comment: Performed at [facility], [place] [address] [place], [place] [address]  APTT     Status: None   Collection Time: 01/17/19 11:12 AM  Result Value Ref Range   aPTT 32 24 - 36 seconds    Comment: Performed at [facility], [place] [address] [place], [place] [address]  Protime-INR     Status: None   Collection Time: 01/17/19 11:12 AM  Result Value Ref Range   Prothrombin Time 14.2 11.4 - 15.2 seconds   INR 1.1 0.8 - 1.2    Comment: (NOTE) INR goal varies based on device and disease states. Performed at [facility], [place] [address], [place], [place] [address]   SARS Coronavirus 2 [facility] order, Performed in [facility] hospital lab) Nasopharyngeal Nasopharyngeal Swab  Status: None   Collection Time: 01/17/19 11:12 AM   Specimen: Nasopharyngeal Swab  Result Value Ref Range   SARS Coronavirus 2 NEGATIVE NEGATIVE     Comment: (NOTE) If result is NEGATIVE SARS-CoV-2 target nucleic acids are NOT DETECTED. The SARS-CoV-2 RNA is generally detectable in upper and lower  respiratory specimens during the acute phase of infection. The lowest  concentration of SARS-CoV-2 viral copies this assay can detect is 250  copies / mL. A negative result does not preclude SARS-CoV-2 infection  and should not be used as the sole basis for treatment or other  patient management decisions.  A negative result may occur with  improper specimen collection / handling, submission of specimen other  than nasopharyngeal swab, presence of viral mutation(s) within the  areas targeted by this assay, and inadequate number of viral copies  (<250 copies / mL). A negative result must be combined with clinical  observations, patient history, and epidemiological information. If result is POSITIVE SARS-CoV-2 target nucleic acids are DETECTED. The SARS-CoV-2 RNA is generally detectable in upper and lower  respiratory specimens dur ing the acute phase of infection.  Positive  results are indicative of active infection with SARS-CoV-2.  Clinical  correlation with patient history and other diagnostic information is  necessary to determine patient infection status.  Positive results do  not rule out bacterial infection or co-infection with other viruses. If result is PRESUMPTIVE POSTIVE SARS-CoV-2 nucleic acids MAY BE PRESENT.   A presumptive positive result was obtained on the submitted specimen  and confirmed on repeat testing.  While 2019 novel coronavirus  (SARS-CoV-2) nucleic acids may be present in the submitted sample  additional confirmatory testing may be necessary for epidemiological  and / or clinical management purposes  to differentiate between  SARS-CoV-2 and other Sarbecovirus currently known to infect humans.  If clinically indicated additional testing with an alternate test  methodology [phone]) is advised. The SARS-CoV-2  RNA is generally  detectable in upper and lower respiratory sp ecimens during the acute  phase of infection. The expected result is Negative. Fact Sheet for Patients:  StrictlyIdeas.no Fact Sheet for Healthcare Providers: BankingDealers.co.za This test is not yet approved or cleared by the [place] FDA and has been authorized for detection and/or diagnosis of SARS-CoV-2 by FDA under an Emergency Use Authorization (EUA).  This EUA will remain in effect (meaning this test can be used) for the duration of the COVID-19 declaration under Section 564(b)(1) of the Act, 21 U.S.C. section 360bbb-3(b)(1), unless the authorization is terminated or revoked sooner. Performed at [facility], [place] [address] [place], [place] [address]   Magnesium     Status: None   Collection Time: 01/17/19 11:12 AM  Result Value Ref Range   Magnesium 2.1 1.7 - 2.4 mg/dL    Comment: Performed at [facility], [place] [address] [place], [place] [address]  Urinalysis, Routine w reflex microscopic     Status: Abnormal   Collection Time: 01/17/19 12:34 PM  Result Value Ref Range   Color, Urine YELLOW YELLOW   APPearance TURBID (A) CLEAR   Specific Gravity, Urine 1.025 1.005 - 1.030   pH 8.0 5.0 - 8.0   Glucose, UA NEGATIVE NEGATIVE mg/dL   Hgb urine dipstick NEGATIVE NEGATIVE   Bilirubin Urine SMALL (A) NEGATIVE   Ketones, ur NEGATIVE NEGATIVE mg/dL   Protein, ur 100 (A) NEGATIVE mg/dL   Nitrite POSITIVE (A) NEGATIVE   Leukocytes,Ua LARGE (A) NEGATIVE   RBC /  HPF 11-20 0 - 5 RBC/hpf   WBC, UA 21-50 0 - 5 WBC/hpf   Bacteria, UA MANY (A) NONE SEEN   Squamous Epithelial / LPF 0-5 0 - 5   Mucus PRESENT     Comment: Performed at [facility], [place] [address] [place], [place] [address]  Lactic acid, plasma     Status: Abnormal   Collection Time: 01/17/19  1:20 PM  Result Value Ref Range   Lactic Acid,  Venous 2.4 (HH) 0.5 - 1.9 mmol/L    Comment: CRITICAL RESULT CALLED TO, READ BACK BY AND VERIFIED WITH: Kathryne Sharper. RN @1411  ON 07.31.2020 BY COHEN,K Performed at [facility], [place], [place] [address] [place], [place] [address]    Dg Chest Port 1 View  Result Date: 01/17/2019 CLINICAL DATA:  Shortness of breath and congestion EXAM: PORTABLE CHEST 1 VIEW COMPARISON:  PA and lateral chest 08/19/2017.  CT chest 08/18/2017. FINDINGS: Patchy airspace disease is seen in the right mid and lower lung zones. Left lung is clear. Heart size is normal. No pneumothorax or pleural effusion. No acute or focal bony abnormality. IMPRESSION: Patchy airspace disease in the right mid and lower lung zones most consistent with pneumonia. Electronically Signed   By: Inge Rise M.D.   On: 01/17/2019 10:57    Pending Labs Unresulted Labs (From admission, onward)    Start     Ordered   01/17/19 1032  Blood Culture (routine x 2)  BLOOD CULTURE X 2,   STAT     01/17/19 1031   01/17/19 1032  Urine culture  ONCE - STAT,   STAT     01/17/19 1031   Signed and Held  Creatinine, serum  (enoxaparin (LOVENOX)    CrCl < 30 ml/min)  Weekly,   R    Comments: while on enoxaparin therapy.    Signed and Held   Signed and Held  CBC  Tomorrow morning,   R     Signed and Held   Signed and Held  Basic metabolic panel  Tomorrow morning,   R     Signed and Held          Vitals/Pain Today's Vitals   01/17/19 1448 01/17/19 1448 01/17/19 1500 01/17/19 1530  BP:   115/69 115/74  Pulse:   85 85  Resp:   20 20  Temp: 98.4 F (36.9 C)     TempSrc: Oral     SpO2:   96% 96%  PainSc:  0-No pain      Isolation Precautions No active isolations  Medications Medications  cefTRIAXone (ROCEPHIN) 2 g in sodium chloride 0.9 % 100 mL IVPB (0 g Intravenous Stopped 01/17/19 1416)  azithromycin (ZITHROMAX) 500 mg in sodium chloride 0.9 % 250 mL IVPB (0 mg Intravenous Stopped 01/17/19 1306)  potassium chloride SA (K-DUR)  CR tablet 20 mEq (has no administration in time range)  carbidopa-levodopa (SINEMET IR) 10-100 MG per tablet immediate release 1 tablet (has no administration in time range)  baclofen (LIORESAL) tablet 10 mg (has no administration in time range)  acetaminophen (TYLENOL) tablet 650 mg (650 mg Oral Given 01/17/19 1211)  lactated ringers bolus 1,000 mL (0 mLs Intravenous Stopped 01/17/19 1448)  potassium chloride SA (K-DUR) CR tablet 40 mEq (40 mEq Oral Given 01/17/19 1307)    Mobility Walks

## 2019-01-17 NOTE — ED Notes (Signed)
Pt placed on 4L due to spo2 dropping to 82% while on 2L.

## 2019-01-17 NOTE — ED Notes (Signed)
CRITICAL VALUE STICKER  CRITICAL VALUE: Lactic Acid 2.4  DATE & TIME NOTIFIED: 01/17/19 1410  MD NOTIFIED: Tyrell Antonio MD  TIME OF NOTIFICATION: [phone]

## 2019-01-17 NOTE — ED Provider Notes (Signed)
[facility] DEPT Provider Note   CSN: [id number] Arrival date & time: 01/17/19  1004    History   Chief Complaint Chief Complaint  Patient presents with   Fatigue   not sleeping   Cough    HPI [name] is a 78 y.o. male.     HPI  78 year old male presents with fatigue, fever, cough and shortness of breath.  He thinks he might have a urinary tract infection because he has felt this way before with UTIs.  No dysuria.  This has been ongoing for a couple days.  Maximum temperature at home was 101.  He typically checks his oxygen at home because of his COPD history and typically he is in the 90s, occasionally just below 90.  In triage she is 82% on room air. No known contacts for the novel coronavirus.  Past Medical History:  Diagnosis Date   Arthritis    Barrett esophagus    Bronchitis    CAD (coronary artery disease)    pt's wife denies any cardiac history on pt.   Cancer [facility])    thyroid   Carotid artery occlusion    Constipation    COPD (chronic obstructive pulmonary disease) (HCC)    Diabetes mellitus without complication (HCC)    Dyspnea    GERD (gastroesophageal reflux disease)    Headache    migraines   History of thyroid cancer 2008   History of thyroid cancer    Hypertension    OSA (obstructive sleep apnea)    Pneumonia    Restless leg    S/P tendon repair    left thumb   Sleep apnea with use of continuous positive airway pressure (CPAP)    2011 [facility] , AHI  77cn central and obstrcutive. 16 cm water , 3 cm EPR.    Thoracic ascending aortic aneurysm (HCC)    4.2 cm ascending TAA 08/18/17 CTA. Annual imaging recommended.   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   Acute respiratory failure with hypoxemia ([place]) 08/18/2017   Hypokalemia 08/18/2017   Leukocytosis 08/18/2017   Muscle atrophy of lower extremity 08/09/2017   Bowel and bladder incontinence 08/09/2017   Right-sided muscle weakness 08/09/2017   Neuropathy 08/09/2017   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/2017   Abnormality of gait 07/12/2017   Myoclonic jerkings, 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/2017   Acquired right foot drop 07/12/2017   UTI (urinary tract infection) due to Enterococcus 07/12/2017   Pressure injury of skin 06/21/2017   Acute cystitis 06/20/2017   Generalized weakness 06/20/2017   Dehydration 06/20/2017   Dyspnea 12/26/2016   Chronic cough 12/26/2016   Hypersomnia with sleep apnea 10/30/2016   CSA (central sleep apnea) 10/30/2016   Wheezing symptom 10/30/2016   CPAP/BiPAP dependent 07/01/2015   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   RLS (restless legs syndrome) 08/25/2014   Primary gout 08/25/2014   OSA on CPAP 08/25/2014   Severe obesity (BMI >= 40) ([place]) 08/25/2014   Obesity (BMI 30-39.9) 02/18/2013   Sleep apnea with use of continuous positive airway pressure (CPAP)    Occlusion and  stenosis of carotid artery without mention of cerebral infarction 11/29/2012   S/P TKR (total knee replacement) 11/29/2012   History of thyroid cancer     Past Surgical History:  Procedure Laterality Date   ANTERIOR CERVICAL DECOMP/DISCECTOMY FUSION N/A 09/03/2017   Procedure: ACDF - C3-C4 - C4-C5 - C5-C6;  Surgeon: Kary Kos, MD;  Location: [facility];  Service: Neurosurgery;  Laterality: N/A;   CAROTID ENDARTERECTOMY Left January 03, 2006   Dr. Amedeo Plenty   COLONOSCOPY     EYE SURGERY Bilateral    cataract surgery with lens implants   game keepers thumb Right    JOINT REPLACEMENT Left    knee X 2    ROTATOR CUFF REPAIR Right    THYROIDECTOMY  2008        Home Medications    Prior to Admission medications   Medication Sig Start Date End Date Taking? Authorizing Provider  acetaminophen (TYLENOL) 325 MG tablet Take 2 tablets (650 mg total) by mouth every 4 (four) hours as needed for mild pain ((score 1 to 3) or temp > 100.5). 09/06/17  Yes Kary Kos, MD  allopurinol (ZYLOPRIM) 300 MG tablet Take 300 mg by mouth daily.  06/02/15  Yes [provider]  baclofen (LIORESAL) 10 MG tablet Take 1-1.5 tablets (10-15 mg total) by mouth 3 (three) times daily. Patient taking differently: Take 10 mg by mouth 3 (three) times daily.  05/20/18  Yes Dohmeier, Asencion Partridge, MD  calcium citrate-vitamin D (CITRACAL+D) 315-200 MG-UNIT per tablet Take 1 tablet by mouth daily. 750 mg    Yes [provider]  carbidopa-levodopa (SINEMET IR) 10-100 MG tablet Take 1 tablet by mouth 2 (two) times daily. Patient taking differently: Take 1 tablet by mouth daily.  10/30/16  Yes Dohmeier, Asencion Partridge, MD  cholecalciferol (VITAMIN D) 1000 units tablet Take 1,000 Units by mouth daily.    Yes [provider]  cloNIDine (CATAPRES) 0.1 MG tablet Take 0.1 mg by mouth 2 (two) times daily. 10/09/16  Yes [provider]  colchicine 0.6 MG tablet Take 0.6 mg by mouth daily.   Yes [provider]  guaifenesin (HUMIBID E) 400 MG TABS tablet Take 400 mg by mouth 2 (two) times daily as needed (congestion).   Yes [provider]  HYDROcodone-acetaminophen (NORCO/VICODIN) 5-325 MG tablet Take 1 tablet by mouth 3 (three) times daily as needed (pain). Patient taking differently: Take 1 tablet by mouth daily as needed for moderate pain (pain).  09/06/17  Yes Kary Kos, MD  ipratropium-albuterol (DUONEB) 0.5-2.5 (3) MG/3ML SOLN Take 3 mLs by nebulization every 6 (six) hours as needed (cough).   Yes [provider]  levothyroxine (SYNTHROID, LEVOTHROID) 200 MCG tablet Take 200 mcg by mouth  daily before breakfast.    Yes [provider]  losartan-hydrochlorothiazide (HYZAAR) 100-25 MG per tablet Take 1 tablet by mouth daily. 11/04/12  Yes [provider]  metFORMIN (GLUCOPHAGE) 500 MG tablet Take 1 tablet (500 mg total) by mouth 2 (two) times daily with a meal. 08/28/17  Yes Patrecia Pour, Christean Grief, MD  metoprolol succinate (TOPROL-XL) 50 MG 24 hr tablet Take 50 mg by mouth 2 (two) times daily. Take with or immediately following a meal.   Yes [provider]  Multiple Vitamin (MULTIVITAMIN) tablet Take 1 tablet by mouth daily.   Yes [provider]  nitroGLYCERIN (NITROSTAT) 0.4 MG SL tablet Place 0.4 mg every 5 (five) minutes as needed under the tongue for chest pain.   Yes [provider]  oxymetazoline (AFRIN) 0.05 % nasal spray Place 2 sprays into the nose 2 (two) times daily as needed for congestion.    Yes [provider]  pantoprazole (PROTONIX) 40 MG tablet Take 40 mg by mouth every evening.   Yes [provider]  polyethylene glycol (MIRALAX / GLYCOLAX) packet Take 17 g by mouth daily as needed for moderate constipation. Patient taking differently: Take 17 g by mouth every other day.  09/06/17  Yes Kary Kos, MD  rizatriptan (MAXALT) 10 MG tablet Take 10 mg by mouth as needed for migraine.  08/08/18  Yes [provider]  simvastatin (ZOCOR) 40 MG tablet Take 40 mg by mouth daily at 6 PM.  08/07/17  Yes [provider]  TRAVATAN Z 0.004 % SOLN ophthalmic solution Place 1 drop into both eyes at bedtime.  07/25/13  Yes [provider]  TRELEGY ELLIPTA 100-62.5-25 MCG/INH AEPB Inhale 1 puff into the lungs daily.  05/01/18  Yes [provider]  VENTOLIN HFA 108 (90 Base) MCG/ACT inhaler Inhale 1-2 puffs into the lungs every 4 (four) hours as needed for wheezing or shortness of breath.  07/17/18  Yes [provider]    Family History Family History  Problem Relation Age of Onset    COPD Mother    Hyperlipidemia Mother    Hypertension Mother    Diabetes Father    Kidney disease Father        ESRD   Hypertension Father    Cancer Father    Hyperlipidemia Father     Social History Social History   Tobacco Use   Smoking status: Former Smoker    Years: 40.00    Types: 38, Cigarettes    Quit date: 06/19/2005    Years since quitting: 13.5   Smokeless tobacco: Never Used   Tobacco comment: smoked 4-6 cigars daily, only smoked cigarettes socially  Substance Use Topics   Alcohol use: No   Drug use: No     Allergies   Patient has no known allergies.   Review of Systems Review of Systems  Constitutional: Positive for fever.  Respiratory: Positive for cough and shortness of breath.   Cardiovascular: Positive for leg swelling (chronic for months, unchanged).  Gastrointestinal: Negative for abdominal pain.  Genitourinary: Negative for dysuria.  Neurological: Positive for weakness.  All other systems reviewed and are negative.    Physical Exam Updated Vital Signs BP 110/71    Pulse 83    Temp 98.4 F (36.9 C) (Oral)    Resp 17    SpO2 94%   Physical Exam Vitals signs and nursing note reviewed.  Constitutional:      General: He is not in acute distress.    Appearance: He is well-developed. He is not diaphoretic.  HENT:     Head: Normocephalic and atraumatic.     Right Ear: External ear normal.     Left Ear: External ear normal.     Nose: Nose normal.  Eyes:     General:        Right eye: No discharge.        Left eye: No discharge.  Neck:     Musculoskeletal: Neck supple.  Cardiovascular:     Rate and Rhythm: Regular rhythm. Tachycardia present.     Heart sounds: Normal heart sounds.     Comments: HR ~100 Pulmonary:     Effort: Pulmonary effort is normal. Tachypnea present. No accessory muscle usage.     Breath  sounds: Examination of the right-lower field reveals rales. Examination of the left-lower field reveals rales. Rales  present.  Abdominal:     General: There is no distension.     Palpations: Abdomen is soft.     Tenderness: There is no abdominal tenderness.  Musculoskeletal:     Right lower leg: Edema present.     Left lower leg: Edema present.     Comments: Mild non pitting edema to BLE.  Skin:    General: Skin is warm and dry.  Neurological:     Mental Status: He is alert.  Psychiatric:        Mood and Affect: Mood is not anxious.      ED Treatments / Results  Labs (all labs ordered are listed, but only abnormal results are displayed) Labs Reviewed  LACTIC ACID, PLASMA - Abnormal; Notable for the following components:      Result Value   Lactic Acid, Venous 3.3 (*)    All other components within normal limits  LACTIC ACID, PLASMA - Abnormal; Notable for the following components:   Lactic Acid, Venous 2.4 (*)    All other components within normal limits  COMPREHENSIVE METABOLIC PANEL - Abnormal; Notable for the following components:   Potassium 2.9 (*)    Glucose, Bld 100 (*)    BUN 24 (*)    All other components within normal limits  CBC WITH DIFFERENTIAL/PLATELET - Abnormal; Notable for the following components:   WBC 19.7 (*)    RBC 4.11 (*)    Hemoglobin 12.1 (*)    HCT 37.6 (*)    Neutro Abs 17.4 (*)    Monocytes Absolute 1.2 (*)    Abs Immature Granulocytes 0.21 (*)    All other components within normal limits  URINALYSIS, ROUTINE W REFLEX MICROSCOPIC - Abnormal; Notable for the following components:   APPearance TURBID (*)    Bilirubin Urine SMALL (*)    Protein, ur 100 (*)    Nitrite POSITIVE (*)    Leukocytes,Ua LARGE (*)    Bacteria, UA MANY (*)    All other components within normal limits  SARS CORONAVIRUS 2 (HOSPITAL ORDER, [facility])  CULTURE, BLOOD (ROUTINE X 2)  CULTURE, BLOOD (ROUTINE X 2)  URINE CULTURE  APTT  PROTIME-INR  MAGNESIUM    EKG None  Radiology Dg Chest Port 1 View  Result Date: 01/17/2019 CLINICAL DATA:   Shortness of breath and congestion EXAM: PORTABLE CHEST 1 VIEW COMPARISON:  PA and lateral chest 08/19/2017.  CT chest 08/18/2017. FINDINGS: Patchy airspace disease is seen in the right mid and lower lung zones. Left lung is clear. Heart size is normal. No pneumothorax or pleural effusion. No acute or focal bony abnormality. IMPRESSION: Patchy airspace disease in the right mid and lower lung zones most consistent with pneumonia. Electronically Signed   By: Inge Rise M.D.   On: 01/17/2019 10:57    Procedures .Critical Care Performed by: Sherwood Gambler, MD Authorized by: Sherwood Gambler, MD   Critical care provider statement:    Critical care time (minutes):  35   Critical care time was exclusive of:  Separately billable procedures and treating other patients   Critical care was necessary to treat or prevent imminent or life-threatening deterioration of the following conditions:  Sepsis and respiratory failure   Critical care was time spent personally by me on the following activities:  Discussions with consultants, evaluation of patient's response to treatment, examination of patient, ordering and  performing treatments and interventions, ordering and review of laboratory studies, ordering and review of radiographic studies, pulse oximetry, re-evaluation of patient's condition, obtaining history from patient or surrogate and review of old charts   (including critical care time)  Medications Ordered in ED Medications  cefTRIAXone (ROCEPHIN) 2 g in sodium chloride 0.9 % 100 mL IVPB (0 g Intravenous Stopped 01/17/19 1416)  azithromycin (ZITHROMAX) 500 mg in sodium chloride 0.9 % 250 mL IVPB (0 mg Intravenous Stopped 01/17/19 1306)  potassium chloride SA (K-DUR) CR tablet 20 mEq (has no administration in time range)  carbidopa-levodopa (SINEMET IR) 10-100 MG per tablet immediate release 1 tablet (has no administration in time range)  baclofen (LIORESAL) tablet 10 mg (has no administration in  time range)  acetaminophen (TYLENOL) tablet 650 mg (650 mg Oral Given 01/17/19 1211)  lactated ringers bolus 1,000 mL (0 mLs Intravenous Stopped 01/17/19 1448)  potassium chloride SA (K-DUR) CR tablet 40 mEq (40 mEq Oral Given 01/17/19 1307)     Initial Impression / Assessment and Plan / ED Course  I have reviewed the triage vital signs and the nursing notes.  Pertinent labs & imaging results that were available during my care of the patient were reviewed by me and considered in my medical decision making (see chart for details).        Patient's blood pressure has been okay besides the first mildly low blood pressure.  Was never under 35.  Work-up is consistent with severe sepsis, appears to be from pneumonia.  Given IV fluids, IV antibiotics for community-acquired pneumonia.  Coronavirus testing is negative.  With 2 L oxygen his O2 sats are much better and he does not appear to have impending airway failure.  Hospitalist, Dr. Tyrell Antonio, will admit.  [name] was evaluated in Emergency Department on 01/17/2019 for the symptoms described in the history of present illness. He was evaluated in the context of the global COVID-19 pandemic, which necessitated consideration that the patient might be at risk for infection with the SARS-CoV-2 virus that causes COVID-19. Institutional protocols and algorithms that pertain to the evaluation of patients at risk for COVID-19 are in a state of rapid change based on information released by regulatory bodies including the CDC and federal and state organizations. These policies and algorithms were followed during the patient's care in the ED.  Final Clinical Impressions(s) / ED Diagnoses   Final diagnoses:  Severe sepsis ([place])  Community acquired pneumonia of right lower lobe of lung [facility])    ED Discharge Orders    None       Sherwood Gambler, MD 01/17/19 1530

## 2019-01-17 NOTE — ED Triage Notes (Signed)
Pt reports his wife made him come because thinks he has UTI because not sleeping well and urinating more and more fatigued. Pt has congested sounding cough in triage, when asked pt about it states that he sometimes has phlegm to come up. Pt O2 in triage is 82% on RA. Asked patient is he feels SOB, and he replied yes.

## 2019-01-18 ENCOUNTER — Inpatient Hospital Stay (HOSPITAL_COMMUNITY): Payer: Medicare Other

## 2019-01-18 ENCOUNTER — Encounter (HOSPITAL_COMMUNITY): Payer: Self-pay

## 2019-01-18 DIAGNOSIS — E876 Hypokalemia: Secondary | ICD-10-CM

## 2019-01-18 DIAGNOSIS — R0902 Hypoxemia: Secondary | ICD-10-CM

## 2019-01-18 LAB — CBC WITH DIFFERENTIAL/PLATELET
Abs Immature Granulocytes: 0.75 10*3/uL — ABNORMAL HIGH (ref 0.00–0.07)
Basophils Absolute: 0.1 10*3/uL (ref 0.0–0.1)
Basophils Relative: 1 %
Eosinophils Absolute: 0 10*3/uL (ref 0.0–0.5)
Eosinophils Relative: 0 %
HCT: 41.8 % (ref 39.0–52.0)
Hemoglobin: 12.8 g/dL — ABNORMAL LOW (ref 13.0–17.0)
Immature Granulocytes: 5 %
Lymphocytes Relative: 6 %
Lymphs Abs: 1.1 10*3/uL (ref 0.7–4.0)
MCH: 28.7 pg (ref 26.0–34.0)
MCHC: 30.6 g/dL (ref 30.0–36.0)
MCV: 93.7 fL (ref 80.0–100.0)
Monocytes Absolute: 1 10*3/uL (ref 0.1–1.0)
Monocytes Relative: 6 %
Neutro Abs: 13.9 10*3/uL — ABNORMAL HIGH (ref 1.7–7.7)
Neutrophils Relative %: 82 %
Platelets: 226 10*3/uL (ref 150–400)
RBC: 4.46 MIL/uL (ref 4.22–5.81)
RDW: 14.8 % (ref 11.5–15.5)
WBC: 16.8 10*3/uL — ABNORMAL HIGH (ref 4.0–10.5)
nRBC: 0 % (ref 0.0–0.2)

## 2019-01-18 LAB — BASIC METABOLIC PANEL
Anion gap: 13 (ref 5–15)
BUN: 17 mg/dL (ref 8–23)
CO2: 32 mmol/L (ref 22–32)
Calcium: 8.1 mg/dL — ABNORMAL LOW (ref 8.9–10.3)
Chloride: 96 mmol/L — ABNORMAL LOW (ref 98–111)
Creatinine, Ser: 0.88 mg/dL (ref 0.61–1.24)
GFR calc Af Amer: >60 mL/min (ref >60)
GFR calc non Af Amer: >60 mL/min (ref >60)
Glucose, Bld: 109 mg/dL — ABNORMAL HIGH (ref 70–99)
Potassium: 3.3 mmol/L — ABNORMAL LOW (ref 3.5–5.1)
Sodium: 141 mmol/L (ref 135–145)

## 2019-01-18 LAB — BLOOD GAS, ARTERIAL
Acid-Base Excess: 3.8 mmol/L — ABNORMAL HIGH (ref 0.0–2.0)
Acid-Base Excess: 7.4 mmol/L — ABNORMAL HIGH (ref 0.0–2.0)
Bicarbonate: 31.6 mmol/L — ABNORMAL HIGH (ref 20.0–28.0)
Bicarbonate: 35.7 mmol/L — ABNORMAL HIGH (ref 20.0–28.0)
Delivery systems: POSITIVE
Drawn by: 331471
Drawn by: 51425
Expiratory PAP: 6
FIO2: 60
Inspiratory PAP: 14
Mode: POSITIVE
O2 Saturation: 86.9 %
O2 Saturation: 98.9 %
Patient temperature: 98.6
Patient temperature: 100.8
pCO2 arterial: 66.5 mmHg (ref 32.0–48.0)
pCO2 arterial: 78.8 mmHg (ref 32.0–48.0)
pH, Arterial: 7.299 — ABNORMAL LOW (ref 7.350–7.450)
pH, Arterial: 7.286 — ABNORMAL LOW (ref 7.350–7.450)
pO2, Arterial: 59.8 mmHg — ABNORMAL LOW (ref 83.0–108.0)
pO2, Arterial: 171 mmHg — ABNORMAL HIGH (ref 83.0–108.0)

## 2019-01-18 LAB — MRSA PCR SCREENING: MRSA by PCR: NEGATIVE

## 2019-01-18 IMAGING — DX PORTABLE CHEST - 1 VIEW
1 series · 1 of 1 positions shown · non-contrast
Comparison: [DATE]

CLINICAL DATA: Hypoxia, shortness of breath

EXAM:
PORTABLE CHEST 1 VIEW

[chest ap]
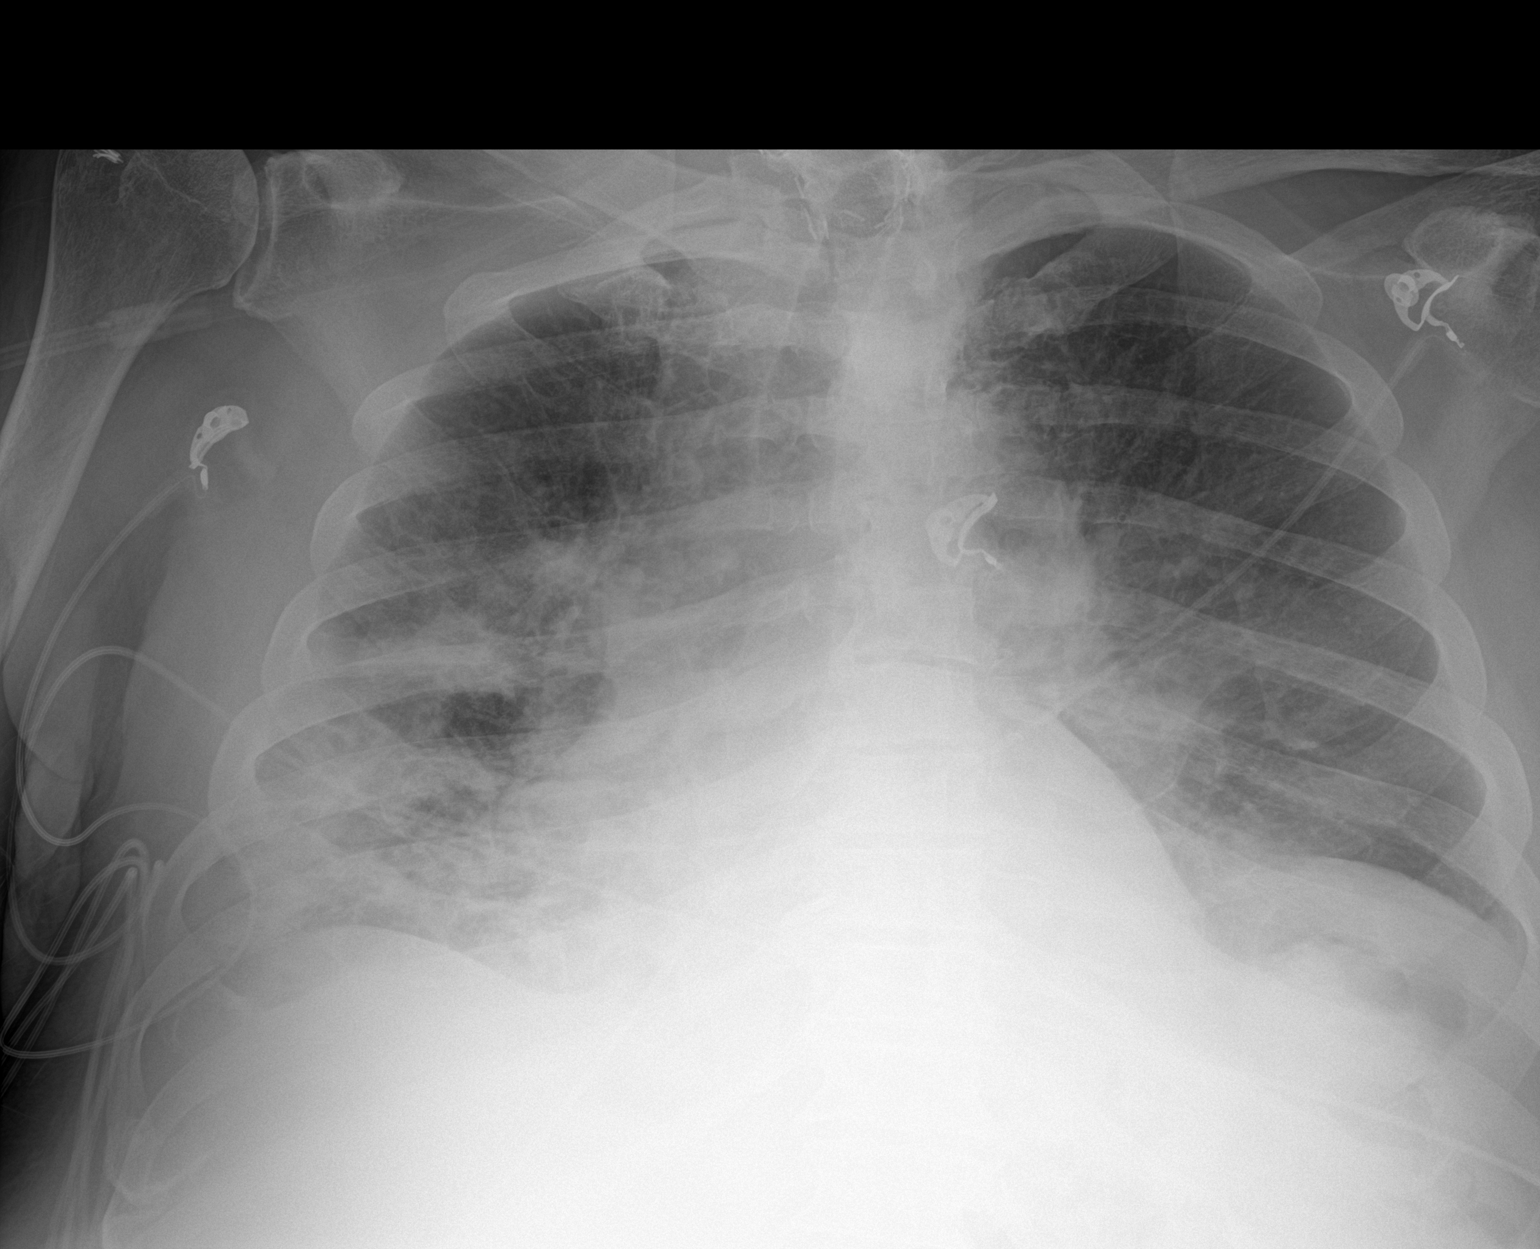

[1 of 1 positions shown; findings below may reference images not displayed]

FINDINGS: The heart size and mediastinal contours are stable. The heart size
is enlarged. Patchy consolidation of the right mid lung and right
lower lung zones are identified. There is mild patchy opacity of the
medial left lung base. Increased central pulmonary vessel caliber
are noted. The visualized skeletal structures are stable.
IMPRESSION: Central pulmonary vascular congestion.

Right lung pneumonia slightly worse compared prior exam. Patchy
opacity of the medial left lung base, developing pneumonia is not
excluded.

## 2019-01-18 MED ORDER — AZITHROMYCIN 250 MG PO TABS
500.0000 mg | ORAL_TABLET | Freq: Every day | ORAL | Status: DC
  Administered 2019-01-19 – 2019-01-21 (×3): 500 mg via ORAL
  Filled 2019-01-18 (×3): qty 2

## 2019-01-18 MED ORDER — ENOXAPARIN SODIUM 40 MG/0.4ML ~~LOC~~ SOLN
40.0000 mg | SUBCUTANEOUS | Status: DC
  Administered 2019-01-18 – 2019-01-27 (×10): 40 mg via SUBCUTANEOUS
  Filled 2019-01-18 (×10): qty 0.4

## 2019-01-18 MED ORDER — FUROSEMIDE 10 MG/ML IJ SOLN
40.0000 mg | Freq: Two times a day (BID) | INTRAMUSCULAR | Status: DC
  Administered 2019-01-18 – 2019-01-19 (×3): 40 mg via INTRAVENOUS
  Filled 2019-01-18 (×3): qty 4

## 2019-01-18 MED ORDER — POTASSIUM CHLORIDE CRYS ER 20 MEQ PO TBCR
20.0000 meq | EXTENDED_RELEASE_TABLET | Freq: Once | ORAL | Status: AC
  Administered 2019-01-18: 20 meq via ORAL
  Filled 2019-01-18: qty 1

## 2019-01-18 MED ORDER — FUROSEMIDE 10 MG/ML IJ SOLN
40.0000 mg | Freq: Once | INTRAMUSCULAR | Status: AC
  Administered 2019-01-18: 15:00:00 40 mg via INTRAVENOUS
  Filled 2019-01-18: qty 4

## 2019-01-18 NOTE — Progress Notes (Signed)
Post ABG results changes made to Bipap settings as follows.  Rate increased to 10 , IP 18, EP 8 Fio2 decreased to 50%.

## 2019-01-18 NOTE — Progress Notes (Signed)
PROGRESS NOTE    [name]  TML:[id number] DOB: 24-Mar-1941 DOA: 01/17/2019 PCP: [name], MD    Brief Narrative:  [name] is a 78 y.o. 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 On arrival he was febrile, and CXR shows right sided pneumonia.   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  ABG shows respiratory acidosis due to hypercapnia and hypoxia.  BIPAP as needed.  Repeat ABG tonight.  IV lasix 40 mg IV every 12 hours.  Resume IV rocephin and zithromax.  Follow up blood cultures.    COPD:  Resume duonebs.    Gout:  Continue with colchicine and allopurinol.     Hypertension:  Well controlled.    Hypokalemia:  Replaced.    Pyuria:  Resume IV antibiotics.    DVT prophylaxis: (Lovenox/Heparin/SCD's/anticoagulated/None (if comfort care) Code Status: (Full/Partial - specify details) Family Communication: (Specify name, relationship & date discussed. NO "discussed with patient") Disposition Plan: (specify when and where you expect patient to be discharged). Include barriers to DC in this tab.   Consultants:   None.    Procedures: echocardiogram.    Antimicrobials:  Rocephin and zithromax. Since admission.    Subjective: Sob.   Objective: Vitals:   01/18/19  0803 01/18/19 0807 01/18/19 1442 01/18/19 1453  BP:   (!) 159/135   Pulse:   (!) 102   Resp:   14   Temp:   100.1 F (37.8 C)   TempSrc:   Oral   SpO2: 92% 92% (!) 88% 90%  Weight:      Height:        Intake/Output Summary (Last 24 hours) at 01/18/2019 1509 Last data filed at 01/18/2019 1500 Gross per 24 hour  Intake 1225.62 ml  Output 1625 ml  Net -399.38 ml   Filed Weights   01/17/19 1752  Weight: 96.5 kg    Examination:  General exam: mod distress from sob. Tachypnea.   Respiratory system: bilateral rales and rhonchi.  Cardiovascular system: S1 & S2 heard, tachycardic.  Gastrointestinal system: Abdomen is nondistended, soft and nontender. No organomegaly or masses felt. Normal bowel sounds heard. Central nervous system: Alert and oriented. Non focal.  Extremities: Symmetric 5 x 5 power. Skin: No rashes, lesions or ulcers Psychiatry:. Mood & affect appropriate.     Data Reviewed: I have personally reviewed following labs and imaging studies  CBC: Recent Labs  Lab 01/17/19 1112 01/17/19 2246  WBC 19.7* 14.9*  NEUTROABS 17.4*  --   HGB 12.1* 10.9*  HCT 37.6* 35.6*  MCV 91.5 93.0  PLT 220 681   Basic Metabolic Panel: Recent Labs  Lab 01/17/19 1112 01/17/19 2246  NA 141 142  K 2.9* 3.6  CL 99 100  CO2 29 32  GLUCOSE 100* 107*  BUN 24* 25*  CREATININE 0.98 1.03  CALCIUM 8.9 8.5*  MG 2.1  --    GFR: Estimated Creatinine Clearance: 66.5 mL/min (by C-G formula based on SCr of 1.03 mg/dL). Liver Function Tests: Recent Labs  Lab 01/17/19 1112  AST 28  ALT 13  ALKPHOS 48  BILITOT 0.8  PROT 7.2  ALBUMIN 3.7   No results for input(s): LIPASE, AMYLASE in the last 168 hours. No results for input(s): AMMONIA in the last 168 hours. Coagulation Profile: Recent Labs  Lab 01/17/19 1112  INR 1.1   Cardiac Enzymes: No results for input(s): CKTOTAL, CKMB, CKMBINDEX, TROPONINI in the last 168 hours. BNP (last 3 results) No results for input(s): PROBNP  in the last 8760 hours. HbA1C: No results for input(s): HGBA1C in the last 72 hours. CBG: No results for input(s): GLUCAP in the last 168 hours. Lipid Profile: No results for input(s): CHOL, HDL, LDLCALC, TRIG, CHOLHDL, LDLDIRECT in the last 72 hours. Thyroid Function Tests: No results for input(s): TSH, T4TOTAL, FREET4, T3FREE, THYROIDAB in the last 72 hours. Anemia Panel: No results for input(s): VITAMINB12, FOLATE, FERRITIN, TIBC, IRON, RETICCTPCT in the last 72 hours. Sepsis Labs: Recent Labs  Lab 01/17/19 1112 01/17/19 1320 01/17/19 1955 01/17/19 2246  LATICACIDVEN 3.3* 2.4* 2.0* 1.5    Recent Results (from the past 240 hour(s))  Blood Culture (routine x 2)     Status: None (Preliminary result)   Collection Time: 01/17/19 11:12 AM   Specimen: BLOOD  Result Value Ref Range Status   Specimen Description   Final    BLOOD RIGHT ARM Performed at [facility] [address] [place], [place] [address]    Special Requests   Final    BOTTLES DRAWN AEROBIC AND ANAEROBIC Blood Culture adequate volume Performed at [facility] [address] [place], [place] [address]    Culture   Final    NO GROWTH < 24 HOURS Performed at [facility] [address] [place], [place] [address]    Report Status PENDING  Incomplete  Blood Culture (routine x 2)     Status: None (Preliminary result)   Collection Time: 01/17/19 11:12 AM   Specimen: BLOOD  Result Value Ref Range Status   Specimen Description   Final    BLOOD LEFT ARM Performed at [facility] [address], [place], [place] [address]    Special Requests   Final    BOTTLES DRAWN AEROBIC AND ANAEROBIC Blood Culture adequate volume Performed at [facility] [address] [place], [place] [address]    Culture   Final    NO GROWTH < 24 HOURS Performed at [facility] [address] [place], [place] [address]    Report Status  PENDING  Incomplete  SARS Coronavirus 2 [facility] order, Performed in [facility] hospital lab) Nasopharyngeal Nasopharyngeal Swab     Status: None   Collection Time: 01/17/19 11:12 AM   Specimen: Nasopharyngeal Swab  Result Value Ref Range Status   SARS Coronavirus 2 NEGATIVE NEGATIVE Final    Comment: (NOTE) If result is NEGATIVE SARS-CoV-2 target nucleic acids are NOT DETECTED. The SARS-CoV-2 RNA is generally detectable in upper and lower  respiratory specimens during the acute phase of infection. The lowest  concentration of SARS-CoV-2 viral copies this assay can detect is 250  copies / mL. A negative result does not preclude SARS-CoV-2 infection  and should not be used as the sole  basis for treatment or other  patient management decisions.  A negative result may occur with  improper specimen collection / handling, submission of specimen other  than nasopharyngeal swab, presence of viral mutation(s) within the  areas targeted by this assay, and inadequate number of viral copies  (<250 copies / mL). A negative result must be combined with clinical  observations, patient history, and epidemiological information. If result is POSITIVE SARS-CoV-2 target nucleic acids are DETECTED. The SARS-CoV-2 RNA is generally detectable in upper and lower  respiratory specimens dur ing the acute phase of infection.  Positive  results are indicative of active infection with SARS-CoV-2.  Clinical  correlation with patient history and other diagnostic information is  necessary to determine patient infection status.  Positive results do  not rule out bacterial infection or co-infection with other viruses. If result is PRESUMPTIVE POSTIVE SARS-CoV-2 nucleic acids MAY BE PRESENT.   A presumptive positive result was obtained on the submitted specimen  and confirmed on repeat testing.  While 2019 novel coronavirus  (SARS-CoV-2) nucleic acids may be present in the submitted sample  additional confirmatory  testing may be necessary for epidemiological  and / or clinical management purposes  to differentiate between  SARS-CoV-2 and other Sarbecovirus currently known to infect humans.  If clinically indicated additional testing with an alternate test  methodology [phone]) is advised. The SARS-CoV-2 RNA is generally  detectable in upper and lower respiratory sp ecimens during the acute  phase of infection. The expected result is Negative. Fact Sheet for Patients:  StrictlyIdeas.no Fact Sheet for Healthcare Providers: BankingDealers.co.za This test is not yet approved or cleared by the [place] FDA and has been authorized for detection and/or diagnosis of SARS-CoV-2 by FDA under an Emergency Use Authorization (EUA).  This EUA will remain in effect (meaning this test can be used) for the duration of the COVID-19 declaration under Section 564(b)(1) of the Act, 21 U.S.C. section 360bbb-3(b)(1), unless the authorization is terminated or revoked sooner. Performed at [facility], [place] [address], [place], [place] [address]   Urine culture     Status: None (Preliminary result)   Collection Time: 01/17/19 12:34 PM   Specimen: In/Out Cath Urine  Result Value Ref Range Status   Specimen Description   Final    IN/OUT CATH URINE Performed at [facility] [address] [place], [place] [address]    Special Requests   Final    NONE Performed at [facility], [place] [address] [place], [place] [address]    Culture   Final    CULTURE REINCUBATED FOR BETTER GROWTH Performed at [facility], [place] [address] [place], [place] [address]    Report Status PENDING  Incomplete  MRSA PCR Screening     Status: None   Collection Time: 01/17/19  8:42 PM   Specimen: Nasal Mucosa; Nasopharyngeal  Result Value Ref Range Status   MRSA by PCR NEGATIVE NEGATIVE Final    Comment:        The GeneXpert  MRSA Assay (FDA approved for NASAL specimens only), is one component of a comprehensive MRSA colonization surveillance program. It is not intended to diagnose MRSA infection nor to guide or monitor treatment for MRSA infections. Performed at [facility], [name] [address] [place], [place] [address]          Radiology Studies: Dg Chest Port 1 View  Result Date: 01/17/2019 CLINICAL DATA:  Shortness of breath and congestion  EXAM: PORTABLE CHEST 1 VIEW COMPARISON:  PA and lateral chest 08/19/2017.  CT chest 08/18/2017. FINDINGS: Patchy airspace disease is seen in the right mid and lower lung zones. Left lung is clear. Heart size is normal. No pneumothorax or pleural effusion. No acute or focal bony abnormality. IMPRESSION: Patchy airspace disease in the right mid and lower lung zones most consistent with pneumonia. Electronically Signed   By: Inge Rise M.D.   On: 01/17/2019 10:57        Scheduled Meds: . allopurinol  300 mg Oral Daily  . [START ON 01/19/2019] azithromycin  500 mg Oral Daily  . baclofen  10 mg Oral TID  . budesonide (PULMICORT) nebulizer solution  0.25 mg Nebulization BID  . carbidopa-levodopa  1 tablet Oral Daily  . colchicine  0.6 mg Oral Daily  . enoxaparin (LOVENOX) injection  40 mg Subcutaneous Q24H  . guaiFENesin  600 mg Oral BID  . ipratropium-albuterol  3 mL Nebulization TID  . latanoprost  1 drop Both Eyes QHS  . levothyroxine  200 mcg Oral Q0600  . metoprolol succinate  12.5 mg Oral BID  . multivitamin with minerals  1 tablet Oral Daily  . pantoprazole  40 mg Oral QPM  . polyethylene glycol  17 g Oral QODAY  . simvastatin  40 mg Oral q1800   Continuous Infusions: . sodium chloride 75 mL/hr at 01/18/19 0647  . cefTRIAXone (ROCEPHIN)  IV 2 g (01/18/19 0919)     LOS: 1 day    Time spent: 83 min    Hosie Poisson, MD [facility] Pager [phone] If 7PM-7AM, please contact night-coverage [email or web address]  Password [email or web address] 01/18/2019, 3:09 PM

## 2019-01-18 NOTE — Progress Notes (Signed)
Pharmacy Interventions:  IV azith>>PO LMWH 30 >>40 mg   Eudelia Bunch, Pharm.D [phone] 01/18/2019 1:47 PM

## 2019-01-18 NOTE — Progress Notes (Signed)
o2 sat noted to be 87% on 4L, pt noticeably short of breath, very congested cough, called respiratory therapist who administed breathing treatment, increased o2 to 6L. MD notified, orders obtained, pt to transfer to [facility]. Spoke to son on phone and advised of same.

## 2019-01-19 ENCOUNTER — Encounter (HOSPITAL_COMMUNITY): Payer: Self-pay | Admitting: *Deleted

## 2019-01-19 ENCOUNTER — Inpatient Hospital Stay (HOSPITAL_COMMUNITY): Payer: Medicare Other

## 2019-01-19 DIAGNOSIS — I5031 Acute diastolic (congestive) heart failure: Secondary | ICD-10-CM

## 2019-01-19 LAB — BLOOD GAS, ARTERIAL
Acid-Base Excess: 9.4 mmol/L — ABNORMAL HIGH (ref 0.0–2.0)
Bicarbonate: 37.2 mmol/L — ABNORMAL HIGH (ref 20.0–28.0)
Delivery systems: POSITIVE
Drawn by: 441261
Expiratory PAP: 8
FIO2: 50
Inspiratory PAP: 18
O2 Saturation: 96.8 %
Patient temperature: 98.6
pCO2 arterial: 70.4 mmHg (ref 32.0–48.0)
pH, Arterial: 7.343 — ABNORMAL LOW (ref 7.350–7.450)
pO2, Arterial: 93.3 mmHg (ref 83.0–108.0)

## 2019-01-19 LAB — BASIC METABOLIC PANEL
Anion gap: 11 (ref 5–15)
BUN: 21 mg/dL (ref 8–23)
CO2: 34 mmol/L — ABNORMAL HIGH (ref 22–32)
Calcium: 8.2 mg/dL — ABNORMAL LOW (ref 8.9–10.3)
Chloride: 96 mmol/L — ABNORMAL LOW (ref 98–111)
Creatinine, Ser: 0.96 mg/dL (ref 0.61–1.24)
GFR calc Af Amer: >60 mL/min (ref >60)
GFR calc non Af Amer: >60 mL/min (ref >60)
Glucose, Bld: 107 mg/dL — ABNORMAL HIGH (ref 70–99)
Potassium: 3.2 mmol/L — ABNORMAL LOW (ref 3.5–5.1)
Sodium: 141 mmol/L (ref 135–145)

## 2019-01-19 LAB — ECHOCARDIOGRAM COMPLETE
Height: 67 in
Weight: 3403.9 oz

## 2019-01-19 LAB — CBC
HCT: 40.2 % (ref 39.0–52.0)
Hemoglobin: 12.4 g/dL — ABNORMAL LOW (ref 13.0–17.0)
MCH: 29.2 pg (ref 26.0–34.0)
MCHC: 30.8 g/dL (ref 30.0–36.0)
MCV: 94.6 fL (ref 80.0–100.0)
Platelets: 230 10*3/uL (ref 150–400)
RBC: 4.25 MIL/uL (ref 4.22–5.81)
RDW: 14.6 % (ref 11.5–15.5)
WBC: 15.3 10*3/uL — ABNORMAL HIGH (ref 4.0–10.5)
nRBC: 0 % (ref 0.0–0.2)

## 2019-01-19 MED ORDER — POTASSIUM CHLORIDE CRYS ER 20 MEQ PO TBCR
40.0000 meq | EXTENDED_RELEASE_TABLET | Freq: Two times a day (BID) | ORAL | Status: AC
  Administered 2019-01-19 (×2): 40 meq via ORAL
  Filled 2019-01-19 (×2): qty 2

## 2019-01-19 MED ORDER — PERFLUTREN LIPID MICROSPHERE
1.0000 mL | INTRAVENOUS | Status: AC | PRN
  Administered 2019-01-19: 3 mL via INTRAVENOUS
  Filled 2019-01-19: qty 10

## 2019-01-19 NOTE — Progress Notes (Signed)
OT Cancellation Note  Patient Details Name: [name] MRN: [id number] DOB: 1940-07-31   Cancelled Treatment:    Reason Eval/Treat Not Completed: Patient not medically ready. RN asked to hold. OT will continue to follow for evaluation.  [name] 01/19/2019, 8:15 AM   [name] OTR/L Acute Rehabilitation Services Pager: [phone] Office: [phone]

## 2019-01-19 NOTE — Progress Notes (Signed)
Echocardiogram 2D Echocardiogram has been performed.  [name] 01/19/2019, 12:09 PM

## 2019-01-19 NOTE — Progress Notes (Signed)
PROGRESS NOTE    [name]  XIP:[id number] DOB: [birth year] DOA: 01/17/2019 PCP: [name], MD    Brief Narrative:  [name] is a 78 y.o. 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 On arrival he was febrile, and CXR shows right sided pneumonia.   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  ABG shows respiratory acidosis due to hypercapnia and hypoxia.  BIPAP as needed. Repeat ABG shows PH of 7. 34, but persistent elevation of PCO2. Pt was able to get off the BIPAP this am. Resume IV rocephin and zithromax.  Blood cultures have been negative so far. Pt continues to be febrile, but leukocytosis is improving.     Sepsis possibly from proteus UTI and community acquired pneumonia.  t max overnight is 101 despite IV antibiotics of rocephin and zithromax . But blood cultures have been negative.  Urine cultures show proteus, sensitivities are pending.  If he continues to be febrile, will broaden the antibiotic coverage.     Acute on chronic diastolic heart failure: Improving, diuresed about 3 lit  In the last 24 hours.  Continue with IV lasix 40 mg BID.  Continue with strict intake and output.     COPD:  Resume duonebs. Pt not wheezing at this time.    Gout:  Continue with colchicine and allopurinol.     Hypertension:  Well controlled.     Hypokalemia:  Replaced. Repeat K IN AM.    DVT prophylaxis: lovenox Code Status: (DNR.  Family Communication: none at bedside. Discussed with wife on 8/1 Disposition Plan: pending clinical improvement.    Consultants:   None.    Procedures: echocardiogram.    Antimicrobials:  Rocephin and zithromax. Since admission.    Subjective: On BIpap this am, reports feeling better than yesterday.   Objective: Vitals:   01/19/19 0500 01/19/19 0600 01/19/19 0752 01/19/19 0759  BP: (!) 147/69 (!) 141/65    Pulse: 79 75 79   Resp: 14 15 19    Temp:    99.2 F (37.3 C)  TempSrc:    Axillary  SpO2: 95% 100% 100%   Weight:      Height:        Intake/Output Summary (Last 24 hours) at 01/19/2019 0854 Last data filed at 01/19/2019 0447 Gross per 24 hour  Intake 826.3 ml  Output 3525 ml  Net -2698.7 ml   Filed Weights   01/17/19 1752  Weight: 96.5 kg    Examination:  General exam: on BIPAP,   Respiratory system: bilateral rales and rhonchi.  Cardiovascular system: S1 & S2 heard, tachycardic.  Gastrointestinal system: Abdomen is soft NT ND BS+ Central nervous system: Alert and oriented. Non focal.  Extremities: Symmetric 5 x 5 power. Skin: No rashes, lesions or ulcers Psychiatry:. Mood & affect appropriate.     Data Reviewed: I have personally reviewed following labs  and imaging studies  CBC: Recent Labs  Lab 01/17/19 1112 01/17/19 2246 01/18/19 1922 01/19/19 0319  WBC 19.7* 14.9* 16.8* 15.3*  NEUTROABS 17.4*  --  13.9*  --   HGB 12.1* 10.9* 12.8* 12.4*  HCT 37.6* 35.6* 41.8 40.2  MCV 91.5 93.0 93.7 94.6  PLT 220 198 226 916   Basic Metabolic Panel: Recent Labs  Lab 01/17/19 1112 01/17/19 2246 01/18/19 1922 01/19/19 0319  NA 141 142 141 141  K 2.9* 3.6 3.3* 3.2*  CL 99 100 96* 96*  CO2 29 32 32 34*  GLUCOSE 100* 107* 109* 107*  BUN 24* 25* 17 21  CREATININE 0.98 1.03 0.88 0.96  CALCIUM 8.9 8.5* 8.1* 8.2*  MG 2.1  --   --   --    GFR: Estimated  Creatinine Clearance: 71.4 mL/min (by C-G formula based on SCr of 0.96 mg/dL). Liver Function Tests: Recent Labs  Lab 01/17/19 1112  AST 28  ALT 13  ALKPHOS 48  BILITOT 0.8  PROT 7.2  ALBUMIN 3.7   No results for input(s): LIPASE, AMYLASE in the last 168 hours. No results for input(s): AMMONIA in the last 168 hours. Coagulation Profile: Recent Labs  Lab 01/17/19 1112  INR 1.1   Cardiac Enzymes: No results for input(s): CKTOTAL, CKMB, CKMBINDEX, TROPONINI in the last 168 hours. BNP (last 3 results) No results for input(s): PROBNP in the last 8760 hours. HbA1C: No results for input(s): HGBA1C in the last 72 hours. CBG: No results for input(s): GLUCAP in the last 168 hours. Lipid Profile: No results for input(s): CHOL, HDL, LDLCALC, TRIG, CHOLHDL, LDLDIRECT in the last 72 hours. Thyroid Function Tests: No results for input(s): TSH, T4TOTAL, FREET4, T3FREE, THYROIDAB in the last 72 hours. Anemia Panel: No results for input(s): VITAMINB12, FOLATE, FERRITIN, TIBC, IRON, RETICCTPCT in the last 72 hours. Sepsis Labs: Recent Labs  Lab 01/17/19 1112 01/17/19 1320 01/17/19 1955 01/17/19 2246  LATICACIDVEN 3.3* 2.4* 2.0* 1.5    Recent Results (from the past 240 hour(s))  Blood Culture (routine x 2)     Status: None (Preliminary result)   Collection Time: 01/17/19 11:12 AM   Specimen: BLOOD  Result Value Ref Range Status   Specimen Description   Final    BLOOD RIGHT ARM Performed at [facility] [address], [place], [place] [address]    Special Requests   Final    BOTTLES DRAWN AEROBIC AND ANAEROBIC Blood Culture adequate volume Performed at [facility] [address], [place], [place] [address]    Culture   Final    NO GROWTH 2 DAYS Performed at [facility] [address], [place], [place] [address]    Report Status PENDING  Incomplete  Blood Culture (routine x 2)     Status: None (Preliminary result)   Collection  Time: 01/17/19 11:12 AM   Specimen: BLOOD  Result Value Ref Range Status   Specimen Description   Final    BLOOD LEFT ARM Performed at [facility] [address] [place], [place] [address]    Special Requests   Final    BOTTLES DRAWN AEROBIC AND ANAEROBIC Blood Culture adequate volume Performed at [facility] [address] [place], [place] [address]    Culture   Final    NO GROWTH 2 DAYS Performed at [facility] [address], [place],  [address]    Report Status PENDING  Incomplete  SARS  Coronavirus 2 [facility] order, Performed in [facility] hospital lab) Nasopharyngeal Nasopharyngeal Swab     Status: None   Collection Time: 01/17/19 11:12 AM   Specimen: Nasopharyngeal Swab  Result Value Ref Range Status   SARS Coronavirus 2 NEGATIVE NEGATIVE Final    Comment: (NOTE) If result is NEGATIVE SARS-CoV-2 target nucleic acids are NOT DETECTED. The SARS-CoV-2 RNA is generally detectable in upper and lower  respiratory specimens during the acute phase of infection. The lowest  concentration of SARS-CoV-2 viral copies this assay can detect is 250  copies / mL. A negative result does not preclude SARS-CoV-2 infection  and should not be used as the sole basis for treatment or other  patient management decisions.  A negative result may occur with  improper specimen collection / handling, submission of specimen other  than nasopharyngeal swab, presence of viral mutation(s) within the  areas targeted by this assay, and inadequate number of viral copies  (<250 copies / mL). A negative result must be combined with clinical  observations, patient history, and epidemiological information. If result is POSITIVE SARS-CoV-2 target nucleic acids are DETECTED. The SARS-CoV-2 RNA is generally detectable in upper and lower  respiratory specimens dur ing the acute phase of infection.  Positive  results are indicative of active infection  with SARS-CoV-2.  Clinical  correlation with patient history and other diagnostic information is  necessary to determine patient infection status.  Positive results do  not rule out bacterial infection or co-infection with other viruses. If result is PRESUMPTIVE POSTIVE SARS-CoV-2 nucleic acids MAY BE PRESENT.   A presumptive positive result was obtained on the submitted specimen  and confirmed on repeat testing.  While 2019 novel coronavirus  (SARS-CoV-2) nucleic acids may be present in the submitted sample  additional confirmatory testing may be necessary for epidemiological  and / or clinical management purposes  to differentiate between  SARS-CoV-2 and other Sarbecovirus currently known to infect humans.  If clinically indicated additional testing with an alternate test  methodology [phone]) is advised. The SARS-CoV-2 RNA is generally  detectable in upper and lower respiratory sp ecimens during the acute  phase of infection. The expected result is Negative. Fact Sheet for Patients:  StrictlyIdeas.no Fact Sheet for Healthcare Providers: BankingDealers.co.za This test is not yet approved or cleared by the [place] FDA and has been authorized for detection and/or diagnosis of SARS-CoV-2 by FDA under an Emergency Use Authorization (EUA).  This EUA will remain in effect (meaning this test can be used) for the duration of the COVID-19 declaration under Section 564(b)(1) of the Act, 21 U.S.C. section 360bbb-3(b)(1), unless the authorization is terminated or revoked sooner. Performed at [facility], [place] [address] [place], [place] [address]   Urine culture     Status: Abnormal (Preliminary result)   Collection Time: 01/17/19 12:34 PM   Specimen: In/Out Cath Urine  Result Value Ref Range Status   Specimen Description   Final    IN/OUT CATH URINE Performed at [facility] [address],  [place], [place] [address]    Special Requests   Final    NONE Performed at [facility], [place] [address], [place], [place] [address]    Culture 80,000 COLONIES/mL GRAM NEGATIVE RODS (A)  Final   Report Status PENDING  Incomplete  MRSA PCR Screening     Status: None   Collection Time: 01/17/19  8:42 PM   Specimen: Nasal Mucosa; Nasopharyngeal  Result Value Ref Range  Status   MRSA by PCR NEGATIVE NEGATIVE Final    Comment:        The GeneXpert MRSA Assay (FDA approved for NASAL specimens only), is one component of a comprehensive MRSA colonization surveillance program. It is not intended to diagnose MRSA infection nor to guide or monitor treatment for MRSA infections. Performed at [facility], [place] [address], [place], [place] [address]          Radiology Studies: Dg Chest Port 1 View  Result Date: 01/18/2019 CLINICAL DATA:  Hypoxia, shortness of breath EXAM: PORTABLE CHEST 1 VIEW COMPARISON:  January 17, 2019 FINDINGS: The heart size and mediastinal contours are stable. The heart size is enlarged. Patchy consolidation of the right mid lung and right lower lung zones are identified. There is mild patchy opacity of the medial left lung base. Increased central pulmonary vessel caliber are noted. The visualized skeletal structures are stable. IMPRESSION: Central pulmonary vascular congestion. Right lung pneumonia slightly worse compared prior exam. Patchy opacity of the medial left lung base, developing pneumonia is not excluded. Electronically Signed   By: Abelardo Diesel M.D.   On: 01/18/2019 15:40   Dg Chest Port 1 View  Result Date: 01/17/2019 CLINICAL DATA:  Shortness of breath and congestion EXAM: PORTABLE CHEST 1 VIEW COMPARISON:  PA and lateral chest 08/19/2017.  CT chest 08/18/2017. FINDINGS: Patchy airspace disease is seen in the right mid and lower lung zones. Left lung is clear. Heart size is normal. No pneumothorax or pleural effusion. No acute  or focal bony abnormality. IMPRESSION: Patchy airspace disease in the right mid and lower lung zones most consistent with pneumonia. Electronically Signed   By: Inge Rise M.D.   On: 01/17/2019 10:57        Scheduled Meds: . allopurinol  300 mg Oral Daily  . azithromycin  500 mg Oral Daily  . baclofen  10 mg Oral TID  . budesonide (PULMICORT) nebulizer solution  0.25 mg Nebulization BID  . carbidopa-levodopa  1 tablet Oral Daily  . colchicine  0.6 mg Oral Daily  . enoxaparin (LOVENOX) injection  40 mg Subcutaneous Q24H  . furosemide  40 mg Intravenous Q12H  . guaiFENesin  600 mg Oral BID  . ipratropium-albuterol  3 mL Nebulization TID  . latanoprost  1 drop Both Eyes QHS  . levothyroxine  200 mcg Oral Q0600  . metoprolol succinate  12.5 mg Oral BID  . multivitamin with minerals  1 tablet Oral Daily  . pantoprazole  40 mg Oral QPM  . polyethylene glycol  17 g Oral QODAY  . simvastatin  40 mg Oral q1800   Continuous Infusions: . cefTRIAXone (ROCEPHIN)  IV Stopped (01/18/19 0949)     LOS: 2 days    Time spent: 79 min    Hosie Poisson, MD [facility] Pager [phone] If 7PM-7AM, please contact night-coverage [email or web address] Password [facility] 01/19/2019, 8:54 AM

## 2019-01-19 NOTE — Progress Notes (Signed)
Pt. Found off BIPAP with [place] in place.  Saturations within normal limits at this time.  Pt. Without distress and is answering questions appropriately.  Will continue to monitor for need of BIPAP.

## 2019-01-19 NOTE — Progress Notes (Signed)
PT Cancellation Note  Patient Details Name: NOUR SCALISE MRN: [id number] DOB: [birth year]   Cancelled Treatment:       Reason Eval/Treat Not Completed: Patient not medically ready. RN asked to hold. PT will continue to follow for evaluation.   Shae Augello 01/19/2019, 12:18 PM

## 2019-01-20 ENCOUNTER — Inpatient Hospital Stay (HOSPITAL_COMMUNITY): Payer: Medicare Other

## 2019-01-20 ENCOUNTER — Encounter (HOSPITAL_COMMUNITY): Payer: Self-pay | Admitting: Radiology

## 2019-01-20 LAB — CBC
HCT: 39.9 % (ref 39.0–52.0)
Hemoglobin: 11.8 g/dL — ABNORMAL LOW (ref 13.0–17.0)
MCH: 28.2 pg (ref 26.0–34.0)
MCHC: 29.6 g/dL — ABNORMAL LOW (ref 30.0–36.0)
MCV: 95.5 fL (ref 80.0–100.0)
Platelets: 247 10*3/uL (ref 150–400)
RBC: 4.18 MIL/uL — ABNORMAL LOW (ref 4.22–5.81)
RDW: 14.6 % (ref 11.5–15.5)
WBC: 13.8 10*3/uL — ABNORMAL HIGH (ref 4.0–10.5)
nRBC: 0 % (ref 0.0–0.2)

## 2019-01-20 LAB — BASIC METABOLIC PANEL
Anion gap: 9 (ref 5–15)
BUN: 23 mg/dL (ref 8–23)
CO2: 42 mmol/L — ABNORMAL HIGH (ref 22–32)
Calcium: 8.3 mg/dL — ABNORMAL LOW (ref 8.9–10.3)
Chloride: 93 mmol/L — ABNORMAL LOW (ref 98–111)
Creatinine, Ser: 0.98 mg/dL (ref 0.61–1.24)
GFR calc Af Amer: >60 mL/min (ref >60)
GFR calc non Af Amer: >60 mL/min (ref >60)
Glucose, Bld: 107 mg/dL — ABNORMAL HIGH (ref 70–99)
Potassium: 3.8 mmol/L (ref 3.5–5.1)
Sodium: 144 mmol/L (ref 135–145)

## 2019-01-20 IMAGING — CT CT CHEST WITHOUT CONTRAST
2 of 3 series · 15 of 36 positions shown, 18 images · non-contrast
Comparison: Chest CT [DATE]. Portable chest today and earlier.

CLINICAL DATA: 77-year-old male with unresolved pneumonia. Negative
for [LE]. On [DATE].

EXAM:
CT CHEST WITHOUT CONTRAST
TECHNIQUE: Multidetector CT imaging of the chest was performed following the
standard protocol without IV contrast.

[Series 2: thorax · axial · 0.92mm/px · z∈[-292,-36]mm · 12 of 152 slices shown, 15 images]
[im 12/152  mediastinal]
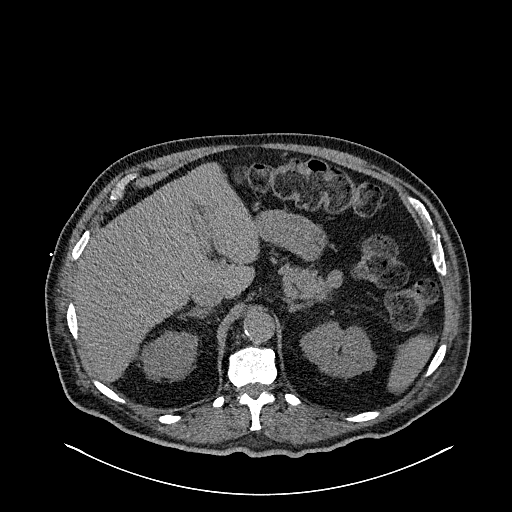
[im 12/152  lung]
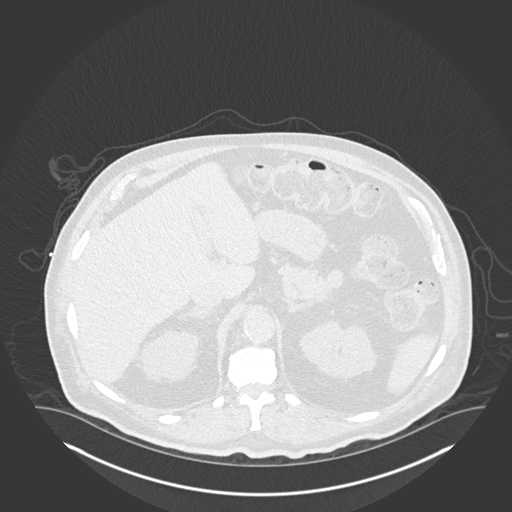
[im 23/152  lung]
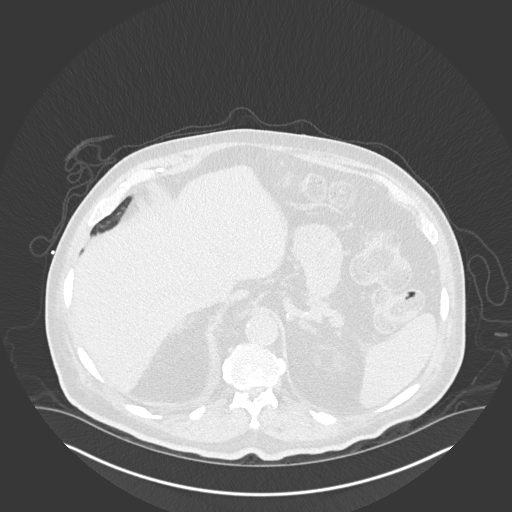
[im 34/152  lung]
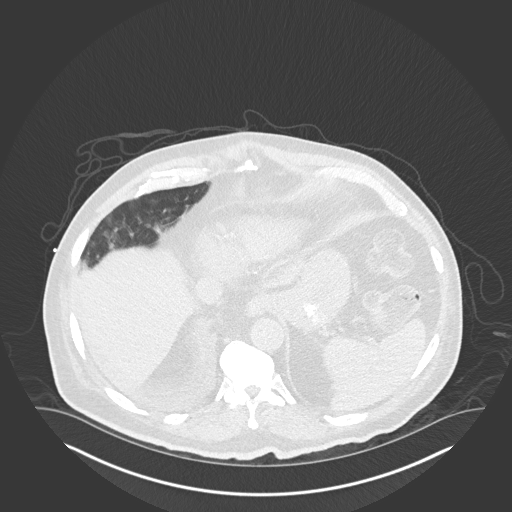
[im 45/152  lung]
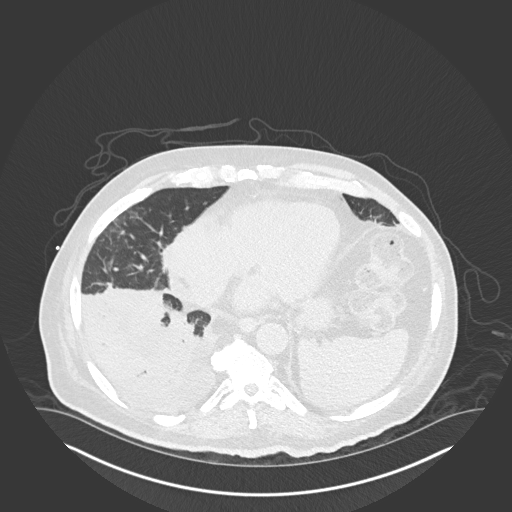
[im 56/152  mediastinal]
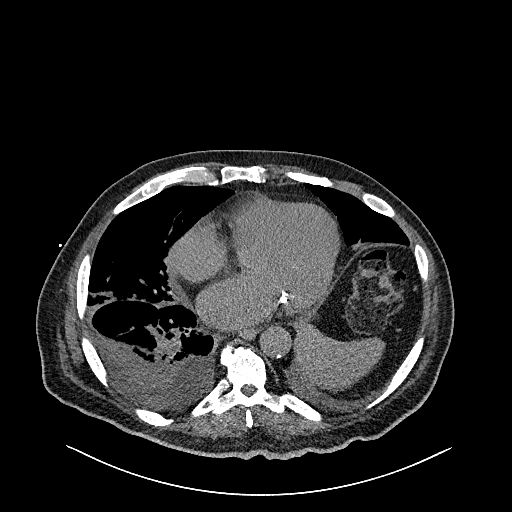
[im 56/152  lung]
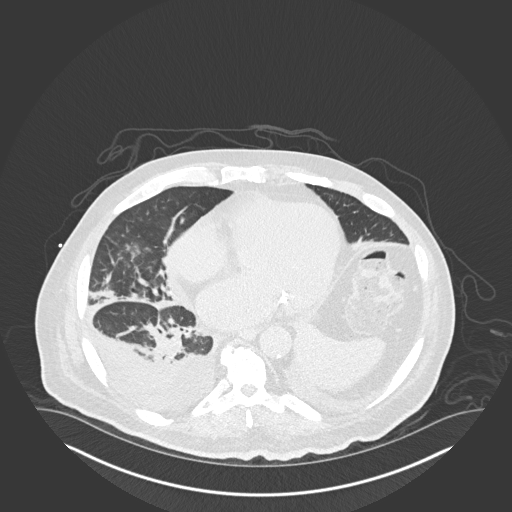
[im 68/152  lung]
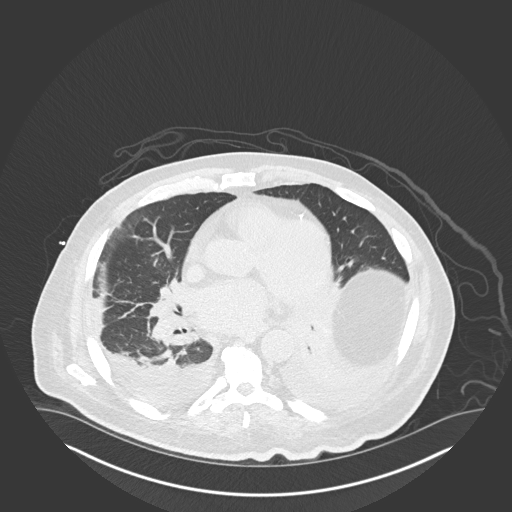
[im 84/152  lung]
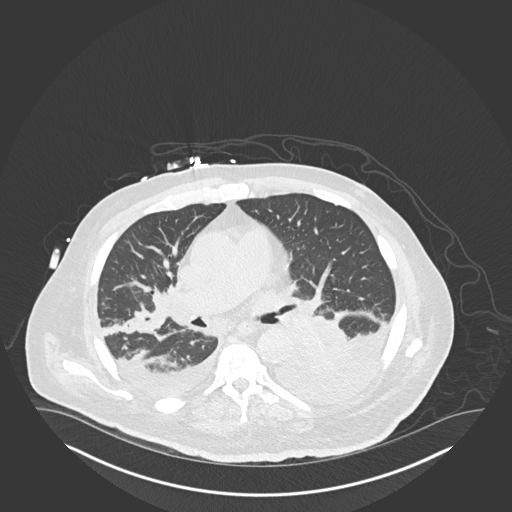
[im 96/152  lung]
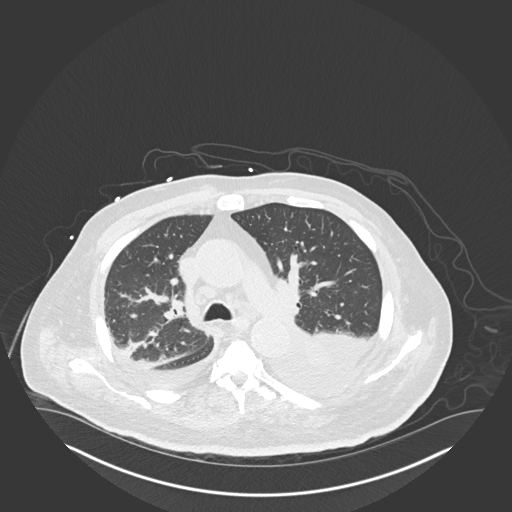
[im 107/152  mediastinal]
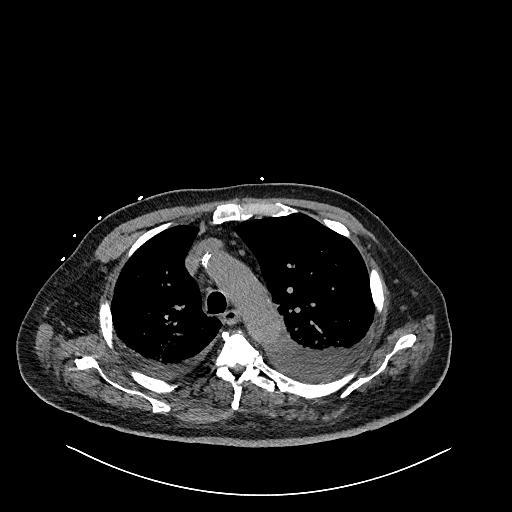
[im 107/152  lung]
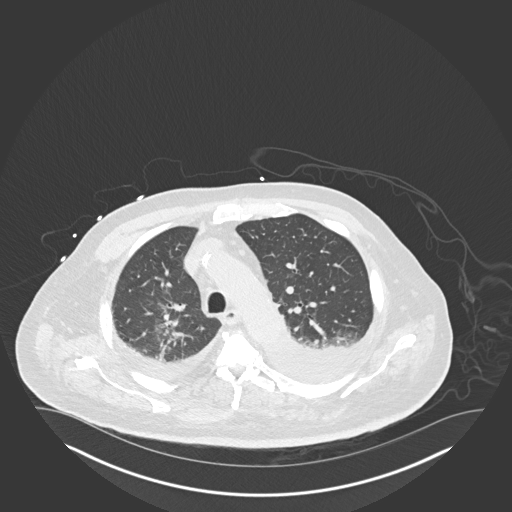
[im 118/152  lung]
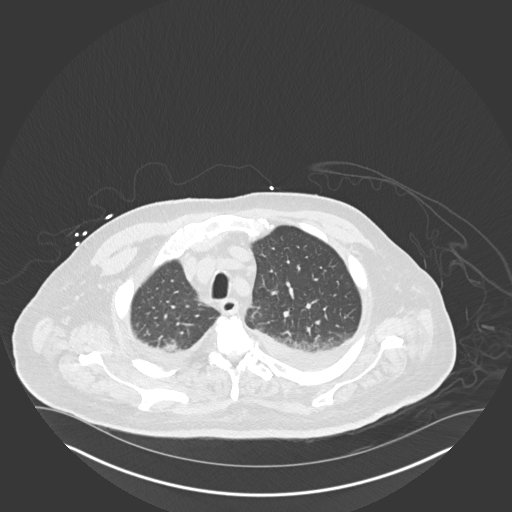
[im 129/152  lung]
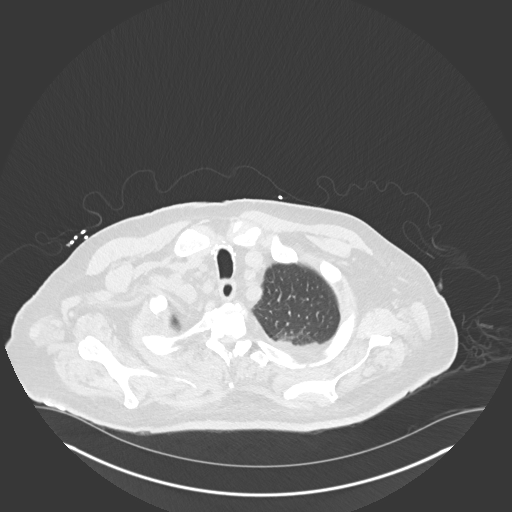
[im 140/152  lung]
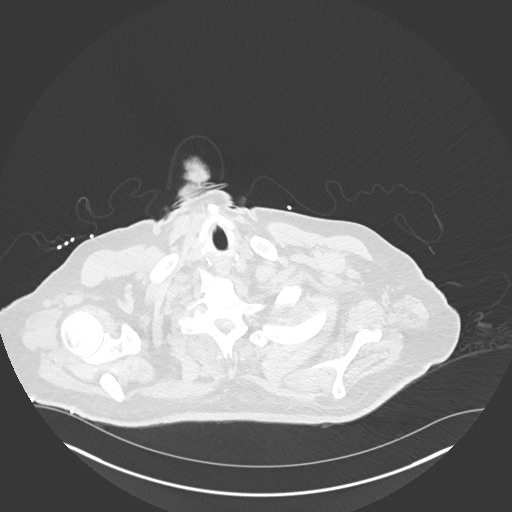

[Series 5: coronal · coronal · 0.59mm/px · 3 of 151 slices shown]
[im 31/151  lung]
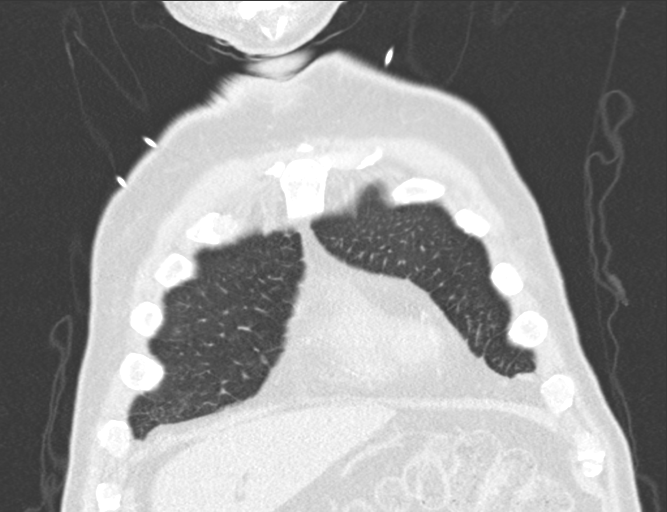
[im 61/151  lung]
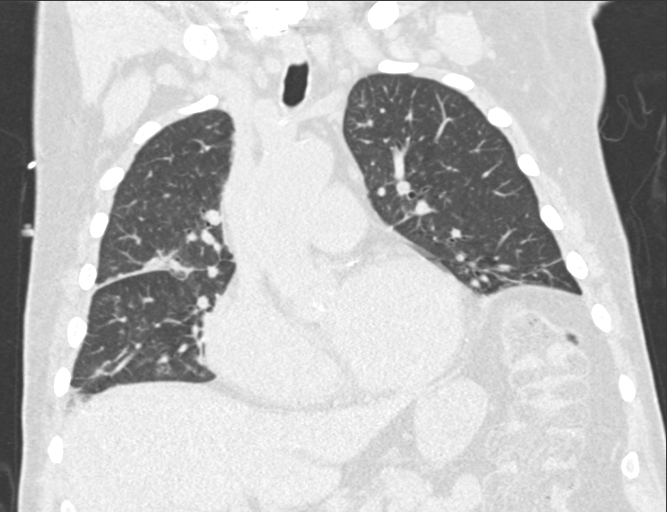
[im 91/151  lung]
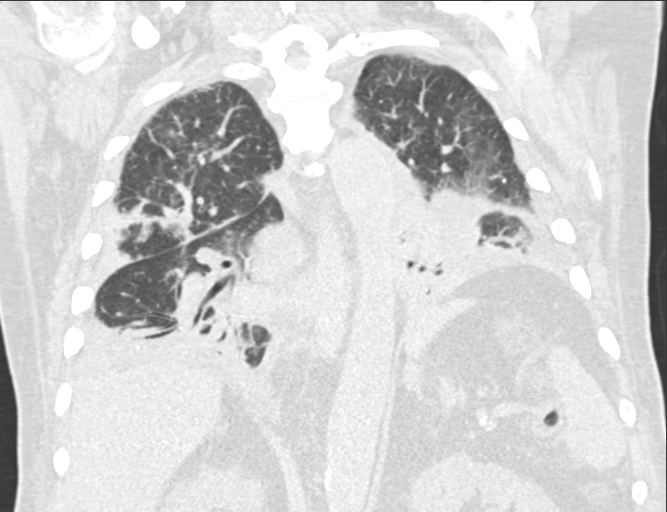

[15 of 36 positions shown; findings below may reference images not displayed]

FINDINGS: Cardiovascular: Calcified coronary artery atherosclerosis and/or
stents (series 2, image 78). Stable cardiac size at the upper limits
of normal. No pericardial effusion. Mild Calcified aortic
atherosclerosis. Vascular patency is not evaluated in the absence of
IV contrast.

Mediastinum/Nodes: Thyroid appears to be surgically absent. Reactive
appearing mediastinal lymph nodes measure up to 12 millimeter short
axis (all subcentimeter in [LE]).

Lungs/Pleura: Layering bilateral pleural effusions, moderate on the
left and small to moderate on the right.

The major airways are patent. Near complete atelectasis or
consolidation of the left lower lobe, with some air bronchograms.
Chronic elevation of the left hemidiaphragm is similar. Right lower
lobe basal segment atelectasis or consolidation, with air
bronchograms. Superimposed dependent opacity in both upper lobes
more resembles atelectasis, but there is also scattered
peribronchial ground-glass opacity in the right upper lobe (series
7, image 47), right middle lobe (image 90) and along the right minor
fissure which appears inflammatory. The left upper lobe and lingula
appear spared.

Upper Abdomen: Negative visible noncontrast liver, spleen, pancreas,
adrenal glands and left kidney. Negative visible bowel. Right upper
pole nephrolithiasis or renal vascular calcifications on series 2,
image 147.

Musculoskeletal: Partially visible cervical ACDF. No acute osseous
abnormality identified.
IMPRESSION: 1. Up to moderate bilateral layering pleural effusions with
bilateral lower lobe confluent atelectasis versus infectious
consolidation.
2. Superimposed right upper and middle middle lobe peribronchial
ground-glass opacity which is compatible with bronchopneumonia.
3. Reactive appearing mediastinal lymph nodes.
4. Calcified coronary artery atherosclerosis. Mild cardiomegaly, no
pericardial effusion.
5. Right nephrolithiasis or renal vascular calcifications.

## 2019-01-20 IMAGING — DX PORTABLE CHEST - 1 VIEW
1 series · 1 of 1 positions shown · non-contrast
Comparison: [DATE]

CLINICAL DATA: Follow-up, ventilation

EXAM:
PORTABLE CHEST 1 VIEW

[chest ap]
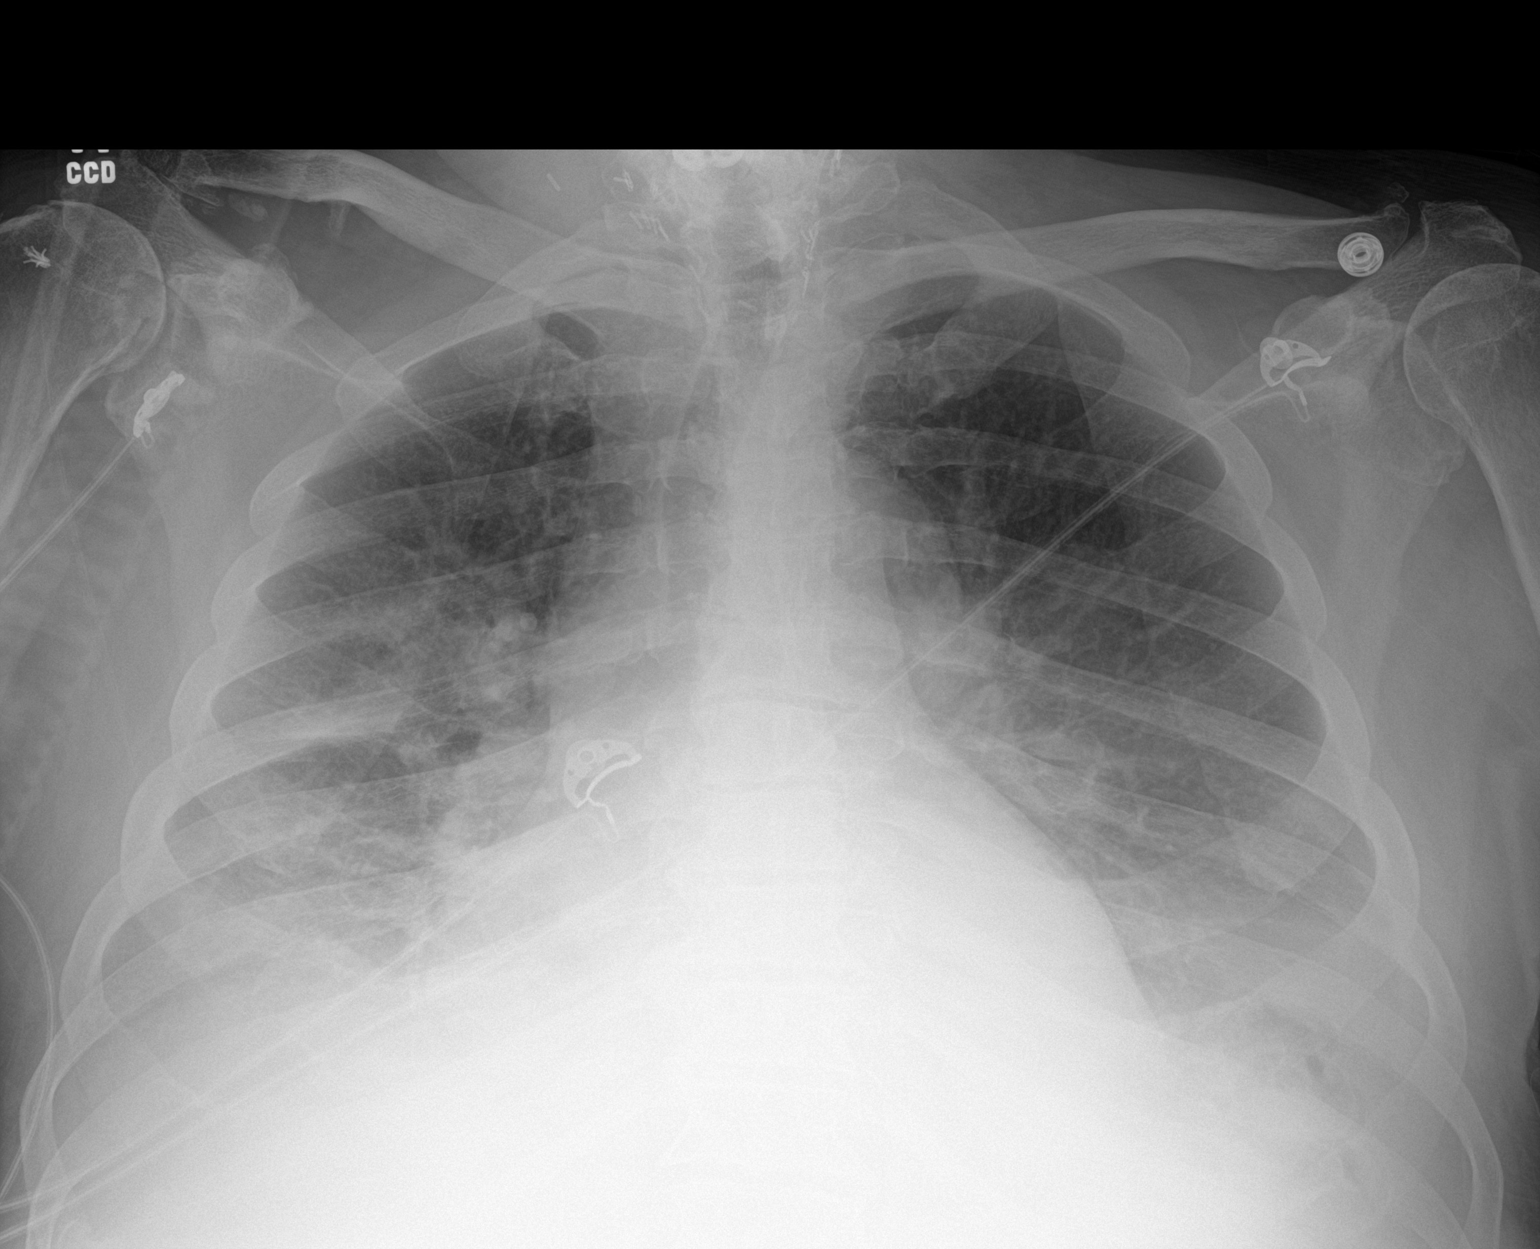

[1 of 1 positions shown; findings below may reference images not displayed]

FINDINGS: Unchanged AP portable examination with layering bilateral pleural
effusions, interstitial pulmonary opacity throughout with
heterogeneous airspace opacity of the right midlung and lung base,
and mild cardiomegaly. There is no endotracheal tube or other
ventilation apparatus in the chest appreciated per stated
indication. Surgical clips about the neck likely related to
thyroidectomy.
IMPRESSION: Unchanged AP portable examination with layering bilateral pleural
effusions, interstitial pulmonary opacity throughout with
heterogeneous airspace opacity of the right midlung and lung base,
and mild cardiomegaly. There is no endotracheal tube or other
ventilation apparatus appreciated in the chest per stated
indication.

## 2019-01-20 MED ORDER — FUROSEMIDE 10 MG/ML IJ SOLN
40.0000 mg | Freq: Two times a day (BID) | INTRAMUSCULAR | Status: DC
  Administered 2019-01-20 – 2019-01-23 (×6): 40 mg via INTRAVENOUS
  Filled 2019-01-20 (×6): qty 4

## 2019-01-20 MED ORDER — CHLORHEXIDINE GLUCONATE CLOTH 2 % EX PADS
6.0000 | MEDICATED_PAD | Freq: Every day | CUTANEOUS | Status: DC
  Administered 2019-01-20 – 2019-01-25 (×6): 6 via TOPICAL

## 2019-01-20 MED ORDER — FUROSEMIDE 10 MG/ML IJ SOLN
40.0000 mg | Freq: Every day | INTRAMUSCULAR | Status: DC
  Administered 2019-01-20: 40 mg via INTRAVENOUS
  Filled 2019-01-20: qty 4

## 2019-01-20 MED ORDER — ORAL CARE MOUTH RINSE
15.0000 mL | Freq: Two times a day (BID) | OROMUCOSAL | Status: DC
  Administered 2019-01-20 – 2019-01-27 (×9): 15 mL via OROMUCOSAL

## 2019-01-20 MED ORDER — METHYLPREDNISOLONE SODIUM SUCC 125 MG IJ SOLR
60.0000 mg | Freq: Two times a day (BID) | INTRAMUSCULAR | Status: DC
  Administered 2019-01-20 – 2019-01-21 (×2): 60 mg via INTRAVENOUS
  Filled 2019-01-20 (×2): qty 2

## 2019-01-20 MED ORDER — CHLORHEXIDINE GLUCONATE 0.12 % MT SOLN
15.0000 mL | Freq: Two times a day (BID) | OROMUCOSAL | Status: DC
  Administered 2019-01-20 – 2019-01-28 (×11): 15 mL via OROMUCOSAL
  Filled 2019-01-20 (×15): qty 15

## 2019-01-20 NOTE — Progress Notes (Signed)
OT Cancellation Note  Patient Details Name: [name] MRN: [id number] DOB: 05-01-1941   Cancelled Treatment:    Reason Eval/Treat Not Completed: Pain limiting ability to participate  Pt states headache 8/10 at this time. He declined OT and ask OT return next day Kari Baars, [place] [phone] Office- [phone], Thereasa Parkin 01/20/2019, 5:03 PM

## 2019-01-20 NOTE — Evaluation (Signed)
Physical Therapy Evaluation Patient Details Name: [name] MRN: [id number] DOB: 03/31/1941 Today's Date: 01/20/2019   History of Present Illness  78 y.o. male with past medical history significant for coronary artery disease, COPD not on home oxygen, diabetes, restless leg syndrome, OSA on CPAP, ACDF C3-6, and admitted for Acute respiratory failure with  hypoxia and hypercapnia secondary to CAP and acute on chronic diastolic heart failure  Clinical Impression  Pt admitted with above diagnosis. Pt currently with functional limitations due to the deficits listed below (see PT Problem List).  Pt will benefit from skilled PT to increase their independence and safety with mobility to allow discharge to the venue listed below.  Pt assisted with rolling however transporter to room so unable to continue with sitting EOB.  Pt reports he has caregiver daily for 3 hours and mostly stays in bed.  Pt likely requiring some assist at baseline.  Recommend SNF if pt does not indeed have assist at home.     Follow Up Recommendations Home health PT;Supervision/Assistance - 24 hour    Equipment Recommendations  None recommended by PT    Recommendations for Other Services       Precautions / Restrictions Precautions Precautions: Fall Precaution Comments: monitor sats      Mobility  Bed Mobility Overal bed mobility: Needs Assistance Bed Mobility: Rolling Rolling: Max assist;+2 for physical assistance         General bed mobility comments: pt assisted with rolling to remove bedpan and for hygiene and linen change; pt requiring max assist, pt attempting to assist as able with upper body; transporter to take pt for CT so unable to sit at [facility]  Transfers                    Ambulation/Gait                Stairs            Wheelchair Mobility    Modified Rankin (Stroke Patients Only)       Balance                                              Pertinent Vitals/Pain Pain Assessment: No/denies pain    Home Living Family/patient expects to be discharged to:: Private residence Living Arrangements: Spouse/significant other Available Help at Discharge: Family;Available 24 hours/day;Personal care attendant Type of Home: House Home Access: Ramped entrance     Home Layout: One level Home Equipment: Walker - 2 wheels      Prior Function Level of Independence: Needs assistance   Gait / Transfers Assistance Needed: pt states he can ambulate short distance with RW however mostly remains in bed, has caregiver 3 hrs daily to assits with bathing     Comments: per chart review from previous admission, was using hoyer lift at home 2019     Hand Dominance        Extremity/Trunk Assessment        Lower Extremity Assessment Lower Extremity Assessment: Generalized weakness(pt provided little assist with bed mobility)       Communication   Communication: No difficulties  Cognition Arousal/Alertness: Awake/alert Behavior During Therapy: WFL for tasks assessed/performed Overall Cognitive Status: Within Functional Limits for tasks assessed  General Comments      Exercises     Assessment/Plan    PT Assessment Patient needs continued PT services  PT Problem List Decreased strength;Decreased mobility;Decreased activity tolerance;Decreased knowledge of use of DME       PT Treatment Interventions DME instruction;Therapeutic exercise;Gait training;Balance training;Functional mobility training;Therapeutic activities;Patient/family education    PT Goals (Current goals can be found in the Care Plan section)  Acute Rehab PT Goals PT Goal Formulation: With patient Time For Goal Achievement: 02/03/19 Potential to Achieve Goals: Good    Frequency Min 2X/week   Barriers to discharge        Co-evaluation               AM-PAC PT "6 Clicks" Mobility   Outcome Measure Help needed turning from your back to your side while in a flat bed without using bedrails?: A Little Help needed moving from lying on your back to sitting on the side of a flat bed without using bedrails?: A Little Help needed moving to and from a bed to a chair (including a wheelchair)?: A Little Help needed standing up from a chair using your arms (e.g., wheelchair or bedside chair)?: A Little Help needed to walk in hospital room?: A Little Help needed climbing 3-5 steps with a railing? : A Little 6 Click Score: 18    End of Session Equipment Utilized During Treatment: Gait belt Activity Tolerance: Patient tolerated treatment well Patient left: in bed;with call bell/phone within reach;with bed alarm set   PT Visit Diagnosis: Other abnormalities of gait and mobility (R26.89)    Time: 9432-7614 PT Time Calculation (min) (ACUTE ONLY): 12 min   Charges:   PT Evaluation $PT Eval Low Complexity: [place], PT, DPT Acute Rehabilitation Services Office: [phone] Pager: [place] E 01/20/2019, 4:33 PM

## 2019-01-20 NOTE — Progress Notes (Signed)
PROGRESS NOTE    [name]  CZY:[id number] DOB: 1941/05/06 DOA: 01/17/2019 PCP: [name], MD    Brief Narrative:  [name] is a 78 y.o. 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 On arrival he was febrile, and CXR shows right sided pneumonia.   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  ABG shows respiratory acidosis due to hypercapnia and hypoxia.  BIPAP as needed. Repeat ABG shows PH of 7. 34, but persistent elevation of PCO2. Pt was able to get off the BIPAP this am. Resume IV rocephin and zithromax to complete atleast 5 days  Blood cultures have been negative so far. Pt continues to be afebrile,  leukocytosis is improving.  Repeat chest x-ray showed persistent bilateral effusions pulmonary edema.  Get CT of the chest without contrast for further evaluation    Sepsis possibly from proteus UTI and community acquired pneumonia.  Fever resolved,  blood cultures have been negative.  Urine cultures show proteus, sensitive to rocephin that he is getting.      Acute on chronic diastolic heart failure: Improving, diuresed about 4 lit since admission.  Continue with IV lasix 40 mg BID.  Continue with strict intake and output.  CXR this am shows bilateral effusions and pulmonary edema.  Creatine appears to be at baseline with IV diuresis.     COPD:  Resume duonebs. Pt not wheezing at this time.    Gout:  Continue with colchicine and allopurinol.   Mild anemia of chronic disease:  Hemoglobin stable between 11 to 12.    Hypertension:  Sub optimally controlled.    Hypokalemia:  Replaced.    DVT prophylaxis: lovenox  Code Status: DNR.  Family Communication: none at bedside. Discussed with wife on 8/3 Disposition Plan: pending clinical improvement.    Consultants:   None.    Procedures: echocardiogram.    Antimicrobials:  Rocephin and zithromax. Since admission.    Subjective: Off BIPAP.on 6 lit of [place] Oxygen. He will probably need BIPAP on discharge  Rather than CPAP.   Objective: Vitals:   01/20/19 0813 01/20/19 0900 01/20/19 1000 01/20/19 1100  BP: (!) 164/94     Pulse: 78 78 87 61  Resp: 17 18 (!) 23 (!) 24  Temp:      TempSrc:      SpO2: 96% 97% 95% 94%  Weight:      Height:        Intake/Output Summary (Last 24 hours) at 01/20/2019 1119 Last data filed at 01/20/2019 1100 Gross per 24 hour  Intake 672.66 ml  Output 3450 ml  Net -2777.34 ml   Filed Weights   01/17/19 1752 01/20/19 0426  Weight: 96.5 kg 91 kg    Examination:  General exam: off BIPAP,  Mild distress. Respiratory system:  Continues to have bilateral rales and rhonchi. Diminished at bases.  Cardiovascular system: S1 & S2 heard, tachycardic.  Gastrointestinal system: Abdomen is soft NT ND BS+ Central nervous system: Alert and oriented. Non focal.  Extremities: Symmetric 5 x 5 power. Skin: No rashes, lesions or ulcers Psychiatry:. Mood & affect appropriate.     Data Reviewed: I have personally reviewed following labs and imaging studies  CBC: Recent Labs  Lab 01/17/19 1112 01/17/19 2246 01/18/19 1922 01/19/19 0319 01/20/19 0223  WBC 19.7* 14.9* 16.8* 15.3* 13.8*  NEUTROABS 17.4*  --  13.9*  --   --   HGB 12.1* 10.9* 12.8* 12.4* 11.8*  HCT 37.6* 35.6* 41.8 40.2 39.9  MCV 91.5 93.0 93.7 94.6 95.5  PLT 220  198 226 230 397   Basic Metabolic Panel: Recent Labs  Lab 01/17/19 1112 01/17/19 2246 01/18/19 1922 01/19/19 0319 01/20/19 0223  NA 141 142 141 141 144  K 2.9* 3.6 3.3* 3.2* 3.8  CL 99 100 96* 96* 93*  CO2 29 32 32 34* 42*  GLUCOSE 100* 107* 109* 107* 107*  BUN 24* 25* 17 21 23   CREATININE 0.98 1.03 0.88 0.96 0.98  CALCIUM 8.9 8.5* 8.1* 8.2* 8.3*  MG 2.1  --   --   --   --    GFR: Estimated Creatinine Clearance: 67.9 mL/min (by C-G formula based on SCr of 0.98 mg/dL). Liver Function Tests: Recent Labs  Lab 01/17/19 1112  AST 28  ALT 13  ALKPHOS 48  BILITOT 0.8  PROT 7.2  ALBUMIN 3.7   No results for input(s): LIPASE, AMYLASE in the last 168 hours. No results for input(s): AMMONIA in the last 168 hours. Coagulation Profile: Recent Labs  Lab 01/17/19 1112  INR 1.1   Cardiac Enzymes: No results for input(s): CKTOTAL, CKMB, CKMBINDEX, TROPONINI in the last 168 hours. BNP (last 3 results) No results for input(s): PROBNP in the last 8760 hours. HbA1C: No results for input(s): HGBA1C in the last 72 hours. CBG: No results for input(s): GLUCAP in the last 168 hours. Lipid Profile: No results for input(s): CHOL, HDL, LDLCALC, TRIG, CHOLHDL, LDLDIRECT in the last 72 hours. Thyroid Function Tests: No results for input(s): TSH, T4TOTAL, FREET4, T3FREE, THYROIDAB in the last 72 hours. Anemia Panel: No results for input(s): VITAMINB12, FOLATE, FERRITIN, TIBC, IRON, RETICCTPCT in the last 72 hours. Sepsis Labs: Recent Labs  Lab 01/17/19 1112 01/17/19 1320 01/17/19 1955 01/17/19 2246  LATICACIDVEN 3.3* 2.4* 2.0* 1.5    Recent Results (from the past 240 hour(s))  Blood Culture (routine x 2)     Status: None (Preliminary result)   Collection Time: 01/17/19 11:12 AM   Specimen: BLOOD  Result Value Ref Range Status   Specimen Description   Final    BLOOD RIGHT ARM Performed at [facility] [address], [place], [place] [address]    Special  Requests   Final    BOTTLES DRAWN AEROBIC AND ANAEROBIC Blood Culture adequate volume Performed at [facility] [address] [place], [place] [address]    Culture   Final    NO GROWTH 3 DAYS Performed at [facility], [place] [address], [place], [place] [address]    Report Status PENDING  Incomplete  Blood Culture (routine x 2)     Status: None (Preliminary result)   Collection Time: 01/17/19 11:12 AM   Specimen: BLOOD  Result Value Ref Range Status   Specimen Description   Final    BLOOD LEFT ARM Performed at [facility],  [place] [address] [place], [place] [address]    Special Requests   Final    BOTTLES DRAWN AEROBIC AND ANAEROBIC Blood Culture adequate volume Performed at [facility] [address], [place], [place] [address]    Culture   Final    NO GROWTH 3 DAYS Performed at [facility], [place] [address] [place], [place] [address]    Report Status PENDING  Incomplete  SARS Coronavirus 2 [facility] order, Performed in [facility] hospital lab) Nasopharyngeal Nasopharyngeal Swab     Status: None   Collection Time: 01/17/19 11:12 AM   Specimen: Nasopharyngeal Swab  Result Value Ref Range Status   SARS Coronavirus 2 NEGATIVE NEGATIVE Final    Comment: (NOTE) If result is NEGATIVE SARS-CoV-2 target nucleic acids are NOT DETECTED. The SARS-CoV-2 RNA is generally detectable in upper and lower  respiratory specimens during the acute phase of infection. The lowest  concentration of SARS-CoV-2 viral copies this assay can detect is 250  copies / mL. A negative result does not preclude SARS-CoV-2 infection  and should not be used as the sole basis for treatment or other  patient management decisions.  A negative result may occur with  improper specimen collection / handling, submission of specimen other  than nasopharyngeal swab, presence of viral mutation(s) within the  areas targeted by this assay, and  inadequate number of viral copies  (<250 copies / mL). A negative result must be combined with clinical  observations, patient history, and epidemiological information. If result is POSITIVE SARS-CoV-2 target nucleic acids are DETECTED. The SARS-CoV-2 RNA is generally detectable in upper and lower  respiratory specimens dur ing the acute phase of infection.  Positive  results are indicative of active infection with SARS-CoV-2.  Clinical  correlation with patient history and other diagnostic information is  necessary to determine patient infection status.  Positive results do  not rule out bacterial infection or co-infection with other viruses. If result is PRESUMPTIVE POSTIVE SARS-CoV-2 nucleic acids MAY BE PRESENT.   A presumptive positive result was obtained on the submitted specimen  and confirmed on repeat testing.  While 2019 novel coronavirus  (SARS-CoV-2) nucleic acids may be present in the submitted sample  additional confirmatory testing may be necessary for epidemiological  and / or clinical management purposes  to differentiate between  SARS-CoV-2 and other Sarbecovirus currently known to infect humans.  If clinically indicated additional testing with an alternate test  methodology [phone]) is advised. The SARS-CoV-2 RNA is generally  detectable in upper and lower respiratory sp ecimens during the acute  phase of infection. The expected result is Negative. Fact Sheet for Patients:  StrictlyIdeas.no Fact Sheet for Healthcare Providers: BankingDealers.co.za This test is not yet approved or cleared by the [place] FDA and has been authorized for detection and/or diagnosis of SARS-CoV-2 by FDA under an Emergency Use Authorization (EUA).  This EUA will remain in effect (meaning this test can be used) for the duration of the COVID-19 declaration under Section 564(b)(1) of the Act, 21 U.S.C. section 360bbb-3(b)(1), unless the  authorization is terminated or revoked sooner. Performed at [facility], [place] [address] [place], [place] [address]   Urine culture     Status: Abnormal (Preliminary result)   Collection Time: 01/17/19 12:34 PM   Specimen: In/Out Cath Urine  Result Value Ref Range Status   Specimen Description   Final    IN/OUT CATH URINE Performed at [facility]  [address] [place], [place] [address]    Special Requests   Final    NONE Performed at [facility], [place] [address], [place], [place] [address]    Culture (A)  Final    80,000 COLONIES/mL PROTEUS MIRABILIS CULTURE REINCUBATED FOR BETTER GROWTH Performed at [facility], [place] [address], [place], [place] [address]    Report Status PENDING  Incomplete   Organism ID, Bacteria PROTEUS MIRABILIS (A)  Final      Susceptibility   Proteus mirabilis - MIC*    AMPICILLIN <=2 SENSITIVE Sensitive     CEFAZOLIN <=4 SENSITIVE Sensitive     CEFTRIAXONE <=1 SENSITIVE Sensitive     CIPROFLOXACIN >=4 RESISTANT Resistant     GENTAMICIN <=1 SENSITIVE Sensitive     IMIPENEM 1 SENSITIVE Sensitive     NITROFURANTOIN >=512 RESISTANT Resistant     TRIMETH/SULFA <=20 SENSITIVE Sensitive     AMPICILLIN/SULBACTAM <=2 SENSITIVE Sensitive     PIP/TAZO <=4 SENSITIVE Sensitive     * 80,000 COLONIES/mL PROTEUS MIRABILIS  MRSA PCR Screening     Status: None   Collection Time: 01/17/19  8:42 PM   Specimen: Nasal Mucosa; Nasopharyngeal  Result Value Ref Range Status   MRSA by PCR NEGATIVE NEGATIVE Final    Comment:        The GeneXpert MRSA Assay (FDA approved for NASAL specimens only), is one component of a comprehensive MRSA colonization surveillance program. It is not intended to diagnose MRSA infection nor to guide or monitor treatment for MRSA infections. Performed at [facility], [place] [address], [place],  [address]          Radiology Studies: Dg  Chest Port 1 View  Result Date: 01/20/2019 CLINICAL DATA:  Follow-up, ventilation EXAM: PORTABLE CHEST 1 VIEW COMPARISON:  01/18/2019 FINDINGS: Unchanged AP portable examination with layering bilateral pleural effusions, interstitial pulmonary opacity throughout with heterogeneous airspace opacity of the right midlung and lung base, and mild cardiomegaly. There is no endotracheal tube or other ventilation apparatus in the chest appreciated per stated indication. Surgical clips about the neck likely related to thyroidectomy. IMPRESSION: Unchanged AP portable examination with layering bilateral pleural effusions, interstitial pulmonary opacity throughout with heterogeneous airspace opacity of the right midlung and lung base, and mild cardiomegaly. There is no endotracheal tube or other ventilation apparatus appreciated in the chest per stated indication. Electronically Signed   By: Eddie Candle M.D.   On: 01/20/2019 09:14   Dg Chest Port 1 View  Result Date: 01/18/2019 CLINICAL DATA:  Hypoxia, shortness of breath EXAM: PORTABLE CHEST 1 VIEW COMPARISON:  January 17, 2019 FINDINGS: The heart size and mediastinal contours are stable. The heart size is enlarged. Patchy consolidation of the right mid lung and right lower lung zones are identified. There is mild patchy opacity of the medial left lung base. Increased central pulmonary vessel caliber are noted. The visualized skeletal structures are stable. IMPRESSION: Central pulmonary vascular congestion. Right lung pneumonia slightly worse compared prior exam. Patchy opacity of the medial left lung base, developing pneumonia is not excluded. Electronically Signed   By: Abelardo Diesel M.D.   On: 01/18/2019 15:40        Scheduled Meds: . allopurinol  300 mg Oral Daily  . azithromycin  500 mg Oral Daily  . baclofen  10 mg Oral TID  . budesonide (PULMICORT) nebulizer solution  0.25 mg Nebulization BID  . carbidopa-levodopa  1 tablet Oral Daily  . chlorhexidine  15  mL Mouth Rinse BID  . Chlorhexidine Gluconate Cloth  6 each Topical Daily  . colchicine  0.6 mg Oral Daily  . enoxaparin (LOVENOX) injection  40 mg Subcutaneous Q24H  . furosemide  40 mg Intravenous Q12H  . guaiFENesin  600 mg Oral BID  . ipratropium-albuterol  3 mL Nebulization TID  . latanoprost  1 drop Both Eyes QHS  . levothyroxine  200 mcg Oral Q0600  . mouth rinse  15 mL Mouth Rinse q12n4p  . metoprolol succinate  12.5 mg Oral BID  . multivitamin with minerals  1 tablet Oral Daily  . pantoprazole  40 mg Oral QPM  . polyethylene glycol  17 g Oral QODAY  . simvastatin  40 mg Oral q1800   Continuous Infusions: . cefTRIAXone (ROCEPHIN)  IV Stopped (01/20/19 0959)     LOS: 3 days    Time spent: 7 min    Hosie Poisson, MD [facility] Pager [phone] If 7PM-7AM, please contact night-coverage [email or web address] Password [email or web address] 01/20/2019, 11:19 AM

## 2019-01-21 ENCOUNTER — Inpatient Hospital Stay (HOSPITAL_COMMUNITY): Payer: Medicare Other

## 2019-01-21 ENCOUNTER — Encounter (HOSPITAL_COMMUNITY): Payer: Self-pay

## 2019-01-21 DIAGNOSIS — J181 Lobar pneumonia, unspecified organism: Secondary | ICD-10-CM

## 2019-01-21 DIAGNOSIS — J9601 Acute respiratory failure with hypoxia: Secondary | ICD-10-CM

## 2019-01-21 DIAGNOSIS — J90 Pleural effusion, not elsewhere classified: Secondary | ICD-10-CM

## 2019-01-21 LAB — BASIC METABOLIC PANEL
Anion gap: 15 (ref 5–15)
BUN: 28 mg/dL — ABNORMAL HIGH (ref 8–23)
CO2: 37 mmol/L — ABNORMAL HIGH (ref 22–32)
Calcium: 8.7 mg/dL — ABNORMAL LOW (ref 8.9–10.3)
Chloride: 89 mmol/L — ABNORMAL LOW (ref 98–111)
Creatinine, Ser: 0.8 mg/dL (ref 0.61–1.24)
GFR calc Af Amer: >60 mL/min (ref >60)
GFR calc non Af Amer: >60 mL/min (ref >60)
Glucose, Bld: 165 mg/dL — ABNORMAL HIGH (ref 70–99)
Potassium: 3.3 mmol/L — ABNORMAL LOW (ref 3.5–5.1)
Sodium: 141 mmol/L (ref 135–145)

## 2019-01-21 LAB — CBC WITH DIFFERENTIAL/PLATELET
Abs Immature Granulocytes: 0.23 10*3/uL — ABNORMAL HIGH (ref 0.00–0.07)
Basophils Absolute: 0.1 10*3/uL (ref 0.0–0.1)
Basophils Relative: 0 %
Eosinophils Absolute: 0 10*3/uL (ref 0.0–0.5)
Eosinophils Relative: 0 %
HCT: 42.9 % (ref 39.0–52.0)
Hemoglobin: 13.7 g/dL (ref 13.0–17.0)
Immature Granulocytes: 2 %
Lymphocytes Relative: 8 %
Lymphs Abs: 0.9 10*3/uL (ref 0.7–4.0)
MCH: 29.3 pg (ref 26.0–34.0)
MCHC: 31.9 g/dL (ref 30.0–36.0)
MCV: 91.9 fL (ref 80.0–100.0)
Monocytes Absolute: 0.5 10*3/uL (ref 0.1–1.0)
Monocytes Relative: 4 %
Neutro Abs: 10.4 10*3/uL — ABNORMAL HIGH (ref 1.7–7.7)
Neutrophils Relative %: 86 %
Platelets: 267 10*3/uL (ref 150–400)
RBC: 4.67 MIL/uL (ref 4.22–5.81)
RDW: 14.3 % (ref 11.5–15.5)
WBC: 12.1 10*3/uL — ABNORMAL HIGH (ref 4.0–10.5)
nRBC: 0 % (ref 0.0–0.2)

## 2019-01-21 LAB — URINE CULTURE: Culture: 80000 — AB

## 2019-01-21 IMAGING — DX PORTABLE CHEST - 1 VIEW
1 series · 1 of 1 positions shown · non-contrast
Comparison: [DATE]

CLINICAL DATA: Hypertension.  Shortness of breath.

EXAM:
PORTABLE CHEST 1 VIEW

[chest ap]
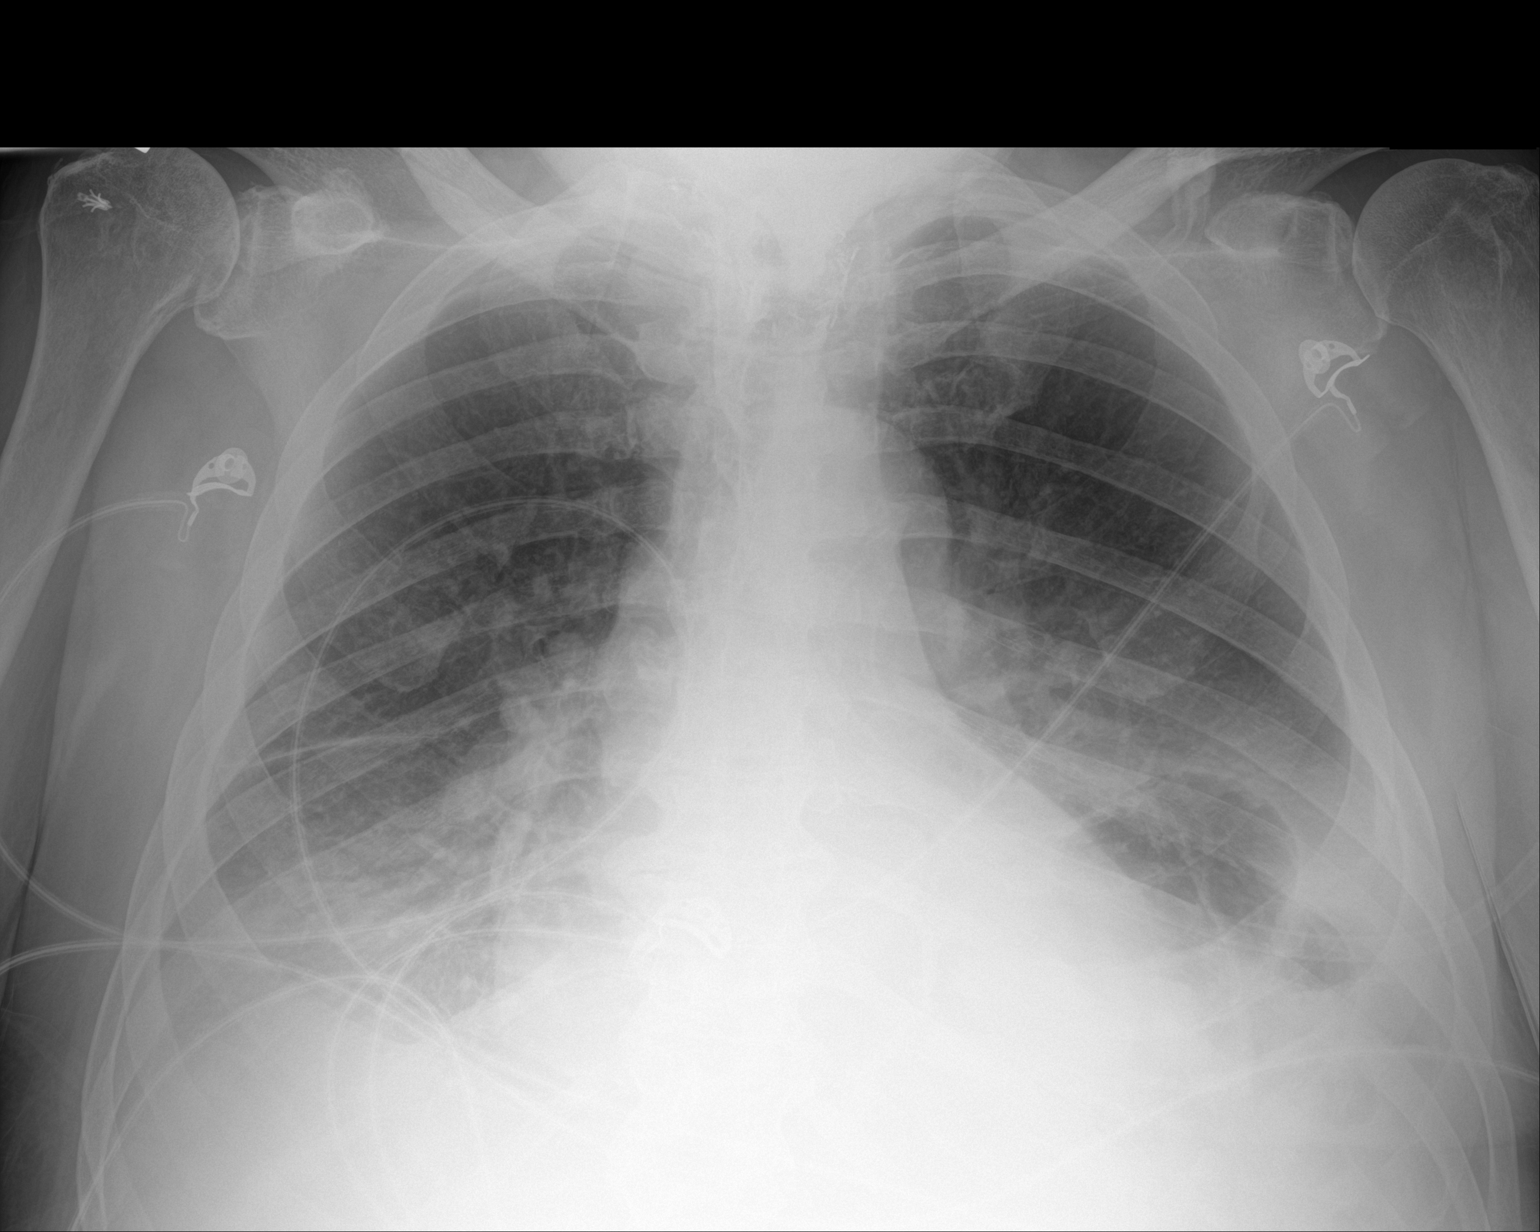

[1 of 1 positions shown; findings below may reference images not displayed]

FINDINGS: There is cardiomegaly with moderate-sized bilateral pleural
effusions and similar bibasilar airspace opacities. No pneumothorax.
There is generalized volume overload. There is no acute osseous
abnormality.
IMPRESSION: Stable appearance of the chest as detailed above.

## 2019-01-21 MED ORDER — METHYLPREDNISOLONE SODIUM SUCC 40 MG IJ SOLR
40.0000 mg | Freq: Two times a day (BID) | INTRAMUSCULAR | Status: DC
  Administered 2019-01-22: 40 mg via INTRAVENOUS
  Filled 2019-01-21 (×2): qty 1

## 2019-01-21 NOTE — Consult Note (Signed)
NAME:  [name], MRN:  [id number], DOB:  August 05, 1940, LOS: 4 ADMISSION DATE:  01/17/2019, CONSULTATION DATE:  01/21/19 REFERRING MD: Dr. Karleen Hampshire  , CHIEF COMPLAINT:  Dyspnea   Brief History   78 y.o. M with PMH of tobacco use and COPD (FEV1 62%, FVC 58%, FEV1/FVC 106% moderately severe obstruction on PFT's in 2018) who was admitted on 01/17/19 for two days of worsening productive cough and shortness of breath, found to have RLL pneumonia.  On 8/3 CT showed bilateral pleural effusions and ground glass opacity and PCCM was consulted.   History of present illness   [name] is a 78 y.o. M with a PMH of tobacco use (quit 13 yrs ago, approximately 40 pack yrs), COPD not on home O2, OSA with CPAP, CAD, CKD, thyroid cancer s/p thyroidectomy, type 2 DM and HTN who was admitted with 7/31 with sepsis due to RLL pneumonia on 7/31. Urine was also infected and grew 80K Proteus Mirabilis.   He was treated with IVF, Ceftriaxone and Azithromycin and placed on 3L [place].  He became more hypoxic and tachypneic with respiratory acidosis and was placed on Bipap and Lasix.   Off Bipap on 8/3; CT showed layering bilateral pleural effusions with superimposed RUL and RML ground glass opacity, therefore PCCM was consulted.   Pt denies any pets beside an (hypoallergenic) dog at home, he worked for [employer] for 18yrs and was exposed to some toner chemicals, no other known occupational exposures.  Covid-19 negative.   Past Medical History      Barrett esophagus    CAD (coronary artery disease)    Cancer (HCC)    thyroid   Carotid artery occlusion    Chronic kidney disease    COPD (chronic obstructive pulmonary disease) (HCC)    Diabetes mellitus without complication (HCC)    GERD (gastroesophageal reflux disease)    History of thyroid cancer    History of thyroid cancer    Hypertension    OSA (obstructive sleep apnea)    Restless leg    Sleep apnea with use of continuous positive airway pressure (CPAP)    2011 [facility] , [facility]  77cn central and obstrcutive. 16 cm water , 3 cm EPR.    Thyroid disease    Ulcer     Significant Hospital Events   7/31 Admitted to Hospitalists  Consults:  8/4 PCCM  Procedures:    Significant Diagnostic Tests:  8/4 Echo>>EF 50-55%, severe aortic valve calcification, nl RV function 8/3 CT Chest>>moderate layering pleural effusions with RUL and RML goundglass opacity  Micro Data:  7/31 Urine Culture>>Proteus Mirabilis 80k>>resistant to Cipro/Nitrofurantoin, sensitive to Ceftriaxone 7/31 Blood Culture>> 7/31 MRSA PCR screen>>neg 7/31 Sars-CoV-2>>negative 8/4 Urine Strep>> 8/4 Sputum Culture>> 8/4 Legionella>>  Antimicrobials:  Ceftriaxone 7/31>> Azithromycin 8/1 >>  Interim history/subjective:  Pt reports feeling much improved today, currently requiring 2.5L [place] and states he feels less dyspneic than he has since admission.  Objective   Blood pressure (!) 157/91, pulse 72, temperature 97.6 F (36.4 C), temperature source Axillary, resp. rate (!) 22, height 5\' 7"  (1.702 m), weight 90 kg, SpO2 97 %. on 2.5L [place]    FiO2 (%):  [40 %] 40 %   Intake/Output Summary (Last 24 hours) at 01/21/2019 1313 Last data filed at 01/21/2019 0600 Gross per 24 hour  Intake 720 ml  Output 1800 ml  Net -1080 ml   Filed Weights   01/17/19 1752 01/20/19 0426 01/21/19 0500  Weight: 96.5 kg 91  kg 90 kg    General:  Elderly M, non-toxic appearing and in no distress HEENT: MM pink/moist Neuro: Awake and alert, moving all extremities CV: s1s2, regular rate and rhythm, no m/r/g PULM:  Good air movement bilaterally, mild expiratory wheeze on the R GI: soft, bsx4 active  Extremities: warm/dry, trace edema  Skin: no rashes or lesions   Resolved Hospital Problem list     Assessment & Plan:   Acute Hypoxic and Hypercarbic Respiratory Failure likely secondary to R-sided PNA and COPD exacerbation  -Now off Bipap on 2.5L [place], dyspnea improved  -CT findings of  layering pleural effusion and RUL/RML groundglass opacity -CXR today is stable -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 based on 2018 spirometry P: -Continue weaning [place] oxygen and finish complete course of Ceftriaxone and Azithromycin -Obtain sputum culture, legionella and step antigen -Wean Solumedrol to 40mg  bid and transition to po 8/5 -continue Pulmicort, Duonebs and Mucinex -Uses alburol/ipratropium duoneb and Ellipta at home, may benefit outpatient pulmonology f/u   HFpEF -Echo shows low normal LV function EF 50-55%, nl RV function and aortic valve calcification without stenosis -diuresed with Lasix, -2.8L yesterday -CXR with persistent pleural effusions and volume overload P: -Continue Lasix 40mg  bid as patient is clinically improving -monitor UOP   Labs   CBC: Recent Labs  Lab 01/17/19 1112 01/17/19 2246 01/18/19 1922 01/19/19 0319 01/20/19 0223 01/21/19 1204  WBC 19.7* 14.9* 16.8* 15.3* 13.8* 12.1*  NEUTROABS 17.4*  --  13.9*  --   --  10.4*  HGB 12.1* 10.9* 12.8* 12.4* 11.8* 13.7  HCT 37.6* 35.6* 41.8 40.2 39.9 42.9  MCV 91.5 93.0 93.7 94.6 95.5 91.9  PLT 220 198 226 230 247 161    Basic Metabolic Panel: Recent Labs  Lab 01/17/19 1112 01/17/19 2246 01/18/19 1922 01/19/19 0319 01/20/19 0223 01/21/19 1204  NA 141 142 141 141 144 141  K 2.9* 3.6 3.3* 3.2* 3.8 3.3*  CL 99 100 96* 96* 93* 89*  CO2 29 32 32 34* 42* 37*  GLUCOSE 100* 107* 109* 107* 107* 165*  BUN 24* 25* 17 21 23  28*  CREATININE 0.98 1.03 0.88 0.96 0.98 0.80  CALCIUM 8.9 8.5* 8.1* 8.2* 8.3* 8.7*  MG 2.1  --   --   --   --   --    GFR: Estimated Creatinine Clearance: 82.8 mL/min (by C-G formula based on SCr of 0.8 mg/dL). Recent Labs  Lab 01/17/19 1112 01/17/19 1320 01/17/19 1955 01/17/19 2246 01/18/19 1922 01/19/19 0319 01/20/19 0223 01/21/19 1204  WBC 19.7*  --   --  14.9* 16.8* 15.3* 13.8* 12.1*  LATICACIDVEN 3.3* 2.4* 2.0* 1.5  --   --   --   --     Liver Function Tests: Recent Labs   Lab 01/17/19 1112  AST 28  ALT 13  ALKPHOS 48  BILITOT 0.8  PROT 7.2  ALBUMIN 3.7   No results for input(s): LIPASE, AMYLASE in the last 168 hours. No results for input(s): AMMONIA in the last 168 hours.  ABG    Component Value Date/Time   PHART 7.343 (L) 01/19/2019 0852   PCO2ART 70.4 (HH) 01/19/2019 0852   PO2ART 93.3 01/19/2019 0852   HCO3 37.2 (H) 01/19/2019 0852   O2SAT 96.8 01/19/2019 0852     Coagulation Profile: Recent Labs  Lab 01/17/19 1112  INR 1.1    Cardiac Enzymes: No results for input(s): CKTOTAL, CKMB, CKMBINDEX, TROPONINI in the last 168 hours.  HbA1C: Hgb A1c MFr Bld  Date/Time Value Ref Range Status  09/03/2017 12:09 PM 5.6 4.8 - 5.6 % Final    Comment:    (NOTE) Pre diabetes:          5.7%-6.4% Diabetes:              >6.4% Glycemic control for   <7.0% adults with diabetes     CBG: No results for input(s): GLUCAP in the last 168 hours.  Review of Systems:   Review of Systems - Negative except as noted in HPI History obtained from the patient   Past Medical History  He,  has a past medical history of Arthritis, Barrett esophagus, Bronchitis, CAD (coronary artery disease), Cancer ([place]), Carotid artery occlusion, Constipation, COPD (chronic obstructive pulmonary disease) ([place]), Diabetes mellitus without complication ([place]), Dyspnea, GERD (gastroesophageal reflux disease), Headache, History of thyroid cancer (2008), History of thyroid cancer, Hypertension, OSA (obstructive sleep apnea), Pneumonia, Restless leg, S/P tendon repair, Sleep apnea with use of continuous positive airway pressure (CPAP), 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   Surgical History    Past Surgical History:  Procedure Laterality Date  . ANTERIOR CERVICAL DECOMP/DISCECTOMY FUSION N/A 09/03/2017   Procedure: ACDF - C3-C4 - C4-C5 - C5-C6;  Surgeon: Kary Kos, MD;  Location: [place];  Service: Neurosurgery;  Laterality: N/A;  . CAROTID  ENDARTERECTOMY Left January 03, 2006   Dr. Amedeo Plenty  . COLONOSCOPY    . EYE SURGERY Bilateral    cataract surgery with lens implants  . game keepers thumb Right   . JOINT REPLACEMENT Left    knee X 2  . ROTATOR CUFF REPAIR Right   . THYROIDECTOMY  2008     Social History   reports that he quit smoking about 13 years ago. His smoking use included cigars and cigarettes. He quit after 40.00 years of use. He has never used smokeless tobacco. He reports that he does not drink alcohol or use drugs.   Family History   His family history includes COPD in his mother; Cancer in his father; Diabetes in his father; Hyperlipidemia in his father and mother; Hypertension in his father and mother; Kidney disease in his father.   Allergies No Known Allergies   Home Medications  Prior to Admission medications   Medication Sig Start Date End Date Taking? Authorizing Provider  acetaminophen (TYLENOL) 325 MG tablet Ta ke 2 tablets (650 mg total) by mouth every 4 (four) hours as needed for mild pain ((score 1 to 3) or temp > 100.5). 09/06/17  Yes Kary Kos, MD  allopurinol (ZYLOPRIM) 300 MG tablet Take 300 mg by mouth daily.  06/02/15  Yes [provider]  baclofen (LIORESAL) 10 MG tablet Take 1-1.5 tablets (10-15 mg total) by mouth 3 (three) times daily. Patient taking differently: Take 10 mg by mouth 3 (three) times daily.  05/20/18  Yes Dohmeier, Asencion Partridge, MD  calcium citrate-vitamin D (CITRACAL+D) 315-200 MG-UNIT per tablet Take 1 tablet by mouth daily. 750 mg    Yes [provider]  carbidopa-levodopa (SINEMET IR) 10-100 MG tablet Take 1 tablet by mouth 2 (two) times daily. Patient taking differently: Take 1 tablet by mouth daily.  10/30/16  Yes Dohmeier, Asencion Partridge, MD  cholecalciferol (VITAMIN D) 1000 units tablet Take 1,000 Units by mouth daily.    Yes [provider]  cloNIDine (CATAPRES) 0.1 MG tablet Take 0.1 mg by mouth 2 (two) times daily. 10/09/16  Yes [provider]   colchicine 0.6 MG tablet Take 0.6  mg by mouth daily.   Yes [provider]  guaifenesin (HUMIBID E) 400 MG TABS tablet Take 400 mg by mouth 2 (two) times daily as needed (congestion).   Yes [provider]  HYDROcodone-acetaminophen (NORCO/VICODIN) 5-325 MG tablet Take 1 tablet by mouth 3 (three) times daily as needed (pain). Patient taking differently: Take 1 tablet by mouth daily as needed for moderate pain (pain).  09/06/17  Yes Kary Kos, MD  ipratropium-albuterol (DUONEB) 0.5-2.5 (3) MG/3ML SOLN Take 3 mLs by nebulization every 6 (six) hours as needed (cough).   Yes [provider]  levothyroxine (SYNTHROID, LEVOTHROID) 200 MCG tablet Take 200 mcg by mouth daily before breakfast.    Yes [provider]  losartan-hydrochlorothiazide (HYZAAR) 100-25 MG per tablet Take 1 tablet by mouth daily. 11/04/12  Yes [provider]  metFORMIN (GLUCOPHAGE) 500 MG tablet Take 1 tablet (500 mg total) by mouth 2 (two) times daily with a meal. 08/28/17  Yes Patrecia Pour, Christean Grief, MD  metoprolol succinate (TOPROL-XL) 50 MG 24 hr tablet Take 50 mg by mouth 2 (two) times daily. Take with or immediately following a meal.   Yes [provider]  Multiple Vitamin (MULTIVITAMIN) tablet Take 1 tablet by mouth daily.   Yes [provider]  nitroGLYCERIN (NITROSTAT) 0.4 MG SL tablet Place 0.4 mg every 5 (five) minutes as needed under the tongue for chest pain.   Yes [provider]  oxymetazoline (AFRIN) 0.05 % nasal spray Place 2 sprays into the nose 2 (two) times daily as needed for congestion.    Yes [provider]  pantoprazole (PROTONIX) 40 MG tablet Take 40 mg by mouth every evening.   Yes [provider]  polyethylene glycol (MIRALAX / GLYCOLAX) packet Take 17 g by mouth daily as needed for moderate constipation. Patient taking differently: Take 17 g by mouth every other day.  09/06/17  Yes Kary Kos, MD  rizatriptan (MAXALT) 10  MG tablet Take 10 mg by mouth as needed for migraine.  08/08/18  Yes [provider]  simvastatin (ZOCOR) 40 MG tablet Take 40 mg by mouth daily at 6 PM.  08/07/17  Yes [provider]  TRAVATAN Z 0.004 % SOLN ophthalmic solution Place 1 drop into both eyes at bedtime.  07/25/13  Yes [provider]  TRELEGY ELLIPTA 100-62.5-25 MCG/INH AEPB Inhale 1 puff into the lungs daily.  05/01/18  Yes [provider]  VENTOLIN HFA 108 (90 Base) MCG/ACT inhaler Inhale 1-2 puffs into the lungs every 4 (four) hours as needed for wheezing or shortness of breath.  07/17/18  Yes [provider]       Otilio Carpen Laiden Milles, PA-C [facility] [facility] Pager# [phone]

## 2019-01-21 NOTE — Progress Notes (Signed)
PROGRESS NOTE    [name]  ZOX:[id number] DOB: 1940-10-01 DOA: 01/17/2019 PCP: [name], MD    Brief Narrative:  [name] is a 78 y.o. 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 On arrival he was febrile, and CXR shows right sided pneumonia.   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  ABG shows respiratory acidosis due to hypercapnia and hypoxia.  BIPAP as needed. Repeat ABG shows PH of 7. 34, but persistent elevation of PCO2. Pt was able to get off the BIPAP this am. Resume IV rocephin and zithromax to complete another 2 days of treatment. IV solumedrol added and plan for a quick taper in the next 2 to 3 days.  Blood cultures have been negative so far. Pt continues to be afebrile,  leukocytosis is improving.  Repeat chest x-ray showed persistent bilateral effusions pulmonary edema.   CT of the chest without contrast showed bilateral layering pleural effusions and right mid lobe consolidation. PCCM consulted for recommendations.     Sepsis possibly from proteus UTI and community acquired pneumonia.  Fever resolved,  blood cultures have been negative.  Urine cultures show proteus, sensitive to rocephin     Acute on chronic diastolic heart failure: Improving, diuresed about 4.5 lit since admission.  Continue with IV lasix 40 mg BID.  Continue  with strict intake and output.  Creatine appears to be at baseline with IV diuresis.  Replace potassium.    COPD:  Resume duonebs. Pt not wheezing at this time.    Gout:  Continue with colchicine and allopurinol.   Mild anemia of chronic disease:  Hemoglobin stable between 11 to 12.    Hypertension:  Well  controlled.    Hypokalemia:  Replaced.    DVT prophylaxis: lovenox  Code Status: DNR.  Family Communication: none at bedside. Discussed with wife on 8/3 Disposition Plan: pending clinical improvement.    Consultants:   PCCM.    Procedures: echocardiogram.    Antimicrobials:  Rocephin and zithromax. Since admission.    Subjective: On 4 lit of [place] oxygen. No chest pain. Reports not feeling good.   Objective: Vitals:   01/21/19 0500 01/21/19 0600 01/21/19 0734 01/21/19 0735  BP:  (!) 157/91  (!) 157/91  Pulse:  68  72  Resp:  (!) 0  (!) 22  Temp:    97.6 F (36.4 C)  TempSrc:    Axillary  SpO2:  99% 97% 97%  Weight: 90 kg     Height:        Intake/Output Summary (Last 24 hours) at 01/21/2019 1131 Last data filed at 01/21/2019 0600 Gross per 24 hour  Intake 720 ml  Output 1800 ml  Net -1080 ml   Filed Weights   01/17/19 1752 01/20/19 0426 01/21/19 0500  Weight: 96.5 kg 91 kg 90 kg    Examination:  General exam: off BIPAP, not in distress.  Respiratory system:  Continues to have bilateral rales and rhonchi. Diminished at bases.  Cardiovascular system: S1 & S2 heard, RRR Gastrointestinal system: Abdomen is soft NT ND BS+ Central nervous system: Alert and oriented. Non focal.  Extremities: Symmetric 5 x 5 power. Skin: No rashes, lesions or ulcers Psychiatry:. Mood & affect appropriate.     Data Reviewed: I have personally reviewed following labs and imaging studies  CBC: Recent Labs  Lab 01/17/19 1112 01/17/19 2246 01/18/19 1922 01/19/19 0319 01/20/19 0223  WBC 19.7* 14.9* 16.8* 15.3* 13.8*  NEUTROABS 17.4*  --  13.9*  --   --    HGB 12.1* 10.9* 12.8* 12.4* 11.8*  HCT 37.6* 35.6* 41.8 40.2 39.9  MCV 91.5 93.0 93.7 94.6 95.5  PLT 220 198 226 230 416   Basic Metabolic Panel: Recent Labs  Lab 01/17/19 1112 01/17/19 2246 01/18/19 1922 01/19/19 0319 01/20/19 0223  NA 141 142 141 141 144  K 2.9* 3.6 3.3* 3.2* 3.8  CL 99 100 96* 96* 93*  CO2 29 32 32 34* 42*  GLUCOSE 100* 107* 109* 107* 107*  BUN 24* 25* 17 21 23   CREATININE 0.98 1.03 0.88 0.96 0.98  CALCIUM 8.9 8.5* 8.1* 8.2* 8.3*  MG 2.1  --   --   --   --    GFR: Estimated Creatinine Clearance: 67.6 mL/min (by C-G formula based on SCr of 0.98 mg/dL). Liver Function Tests: Recent Labs  Lab 01/17/19 1112  AST 28  ALT 13  ALKPHOS 48  BILITOT 0.8  PROT 7.2  ALBUMIN 3.7   No results for input(s): LIPASE, AMYLASE in the last 168 hours. No results for input(s): AMMONIA in the last 168 hours. Coagulation Profile: Recent Labs  Lab 01/17/19 1112  INR 1.1   Cardiac Enzymes: No results for input(s): CKTOTAL, CKMB, CKMBINDEX, TROPONINI in the last 168 hours. BNP (last 3 results) No results for input(s): PROBNP in the last 8760 hours. HbA1C: No results for input(s): HGBA1C in the last 72 hours. CBG: No results for input(s): GLUCAP in the last 168 hours. Lipid Profile: No results for input(s): CHOL, HDL, LDLCALC, TRIG, CHOLHDL, LDLDIRECT in the last 72 hours. Thyroid Function Tests: No results for input(s): TSH, T4TOTAL, FREET4, T3FREE, THYROIDAB in the last 72 hours. Anemia Panel: No results for input(s): VITAMINB12, FOLATE, FERRITIN, TIBC, IRON, RETICCTPCT in the last 72 hours. Sepsis Labs: Recent Labs  Lab 01/17/19 1112 01/17/19 1320 01/17/19 1955 01/17/19 2246  LATICACIDVEN 3.3* 2.4* 2.0* 1.5    Recent Results (from the past 240 hour(s))  Blood Culture (routine x 2)     Status: None (Preliminary result)   Collection Time: 01/17/19 11:12 AM   Specimen: BLOOD  Result Value Ref Range Status   Specimen Description   Final    BLOOD  RIGHT ARM Performed at [facility] [address] [place], [place] [address]    Special Requests   Final    BOTTLES DRAWN AEROBIC AND ANAEROBIC Blood Culture adequate volume Performed at [facility] [address] [place], [place] [address]    Culture   Final    NO GROWTH 4 DAYS Performed at [facility], [place] [address] [place], [place] [address]    Report Status PENDING  Incomplete  Blood Culture (routine x 2)     Status: None (Preliminary result)   Collection Time: 01/17/19 11:12 AM   Specimen: BLOOD  Result Value Ref  Range Status   Specimen Description   Final    BLOOD LEFT ARM Performed at [facility] [address], [place], [place] [address]    Special Requests   Final    BOTTLES DRAWN AEROBIC AND ANAEROBIC Blood Culture adequate volume Performed at [facility] [address], [place], [place] [address]    Culture   Final    NO GROWTH 4 DAYS Performed at [facility], [place] [address] [place], [place] [address]    Report Status PENDING  Incomplete  SARS Coronavirus 2 [facility] order, Performed in [facility] hospital lab) Nasopharyngeal Nasopharyngeal Swab     Status: None   Collection Time: 01/17/19 11:12 AM   Specimen: Nasopharyngeal Swab  Result Value Ref Range Status   SARS Coronavirus 2 NEGATIVE NEGATIVE Final    Comment: (NOTE) If result is NEGATIVE SARS-CoV-2 target nucleic acids are NOT DETECTED. The SARS-CoV-2 RNA is generally detectable in upper and lower  respiratory specimens during the acute phase of infection. The lowest  concentration of SARS-CoV-2 viral copies this assay can detect is 250  copies / mL. A negative result does not preclude SARS-CoV-2 infection  and should not be used as the sole basis for treatment or other  patient management decisions.  A negative result may occur with  improper specimen collection / handling, submission of specimen other   than nasopharyngeal swab, presence of viral mutation(s) within the  areas targeted by this assay, and inadequate number of viral copies  (<250 copies / mL). A negative result must be combined with clinical  observations, patient history, and epidemiological information. If result is POSITIVE SARS-CoV-2 target nucleic acids are DETECTED. The SARS-CoV-2 RNA is generally detectable in upper and lower  respiratory specimens dur ing the acute phase of infection.  Positive  results are indicative of active infection with SARS-CoV-2.  Clinical  correlation with patient history and other diagnostic information is  necessary to determine patient infection status.  Positive results do  not rule out bacterial infection or co-infection with other viruses. If result is PRESUMPTIVE POSTIVE SARS-CoV-2 nucleic acids MAY BE PRESENT.   A presumptive positive result was obtained on the submitted specimen  and confirmed on repeat testing.  While 2019 novel coronavirus  (SARS-CoV-2) nucleic acids may be present in the submitted sample  additional confirmatory testing may be necessary for epidemiological  and / or clinical management purposes  to differentiate between  SARS-CoV-2 and other Sarbecovirus currently known to infect humans.  If clinically indicated additional testing with an alternate test  methodology [phone]) is advised. The SARS-CoV-2 RNA is generally  detectable in upper and lower respiratory sp ecimens during the acute  phase of infection. The expected result is Negative. Fact Sheet for Patients:  StrictlyIdeas.no Fact Sheet for Healthcare Providers: BankingDealers.co.za This test is not yet approved or cleared by the [place] FDA and has been authorized for detection and/or diagnosis of SARS-CoV-2 by FDA under an Emergency Use Authorization (EUA).  This EUA will remain in effect (meaning this test can be used) for the duration of  the COVID-19 declaration under Section 564(b)(1) of the Act, 21 U.S.C. section 360bbb-3(b)(1), unless the authorization is terminated or revoked sooner. Performed at [facility], [place] [address], [place], [place] [address]   Urine culture     Status: Abnormal (Preliminary result)   Collection Time: 01/17/19 12:34 PM   Specimen: In/Out Cath Urine  Result Value Ref Range Status  Specimen Description   Final    IN/OUT CATH URINE Performed at [facility] [address], [place], [place] [address]    Special Requests   Final    NONE Performed at [facility], [place] [address] [place], [place] [address]    Culture (A)  Final    80,000 COLONIES/mL PROTEUS MIRABILIS CULTURE REINCUBATED FOR BETTER GROWTH Performed at [facility], [place] [address] [place], [place] [address]    Report Status PENDING  Incomplete   Organism ID, Bacteria PROTEUS MIRABILIS (A)  Final      Susceptibility   Proteus mirabilis - MIC*    AMPICILLIN <=2 SENSITIVE Sensitive     CEFAZOLIN <=4 SENSITIVE Sensitive     CEFTRIAXONE <=1 SENSITIVE Sensitive     CIPROFLOXACIN >=4 RESISTANT Resistant     GENTAMICIN <=1 SENSITIVE Sensitive     IMIPENEM 1 SENSITIVE Sensitive     NITROFURANTOIN >=512 RESISTANT Resistant     TRIMETH/SULFA <=20 SENSITIVE Sensitive     AMPICILLIN/SULBACTAM <=2 SENSITIVE Sensitive     PIP/TAZO <=4 SENSITIVE Sensitive     * 80,000 COLONIES/mL PROTEUS MIRABILIS  MRSA PCR Screening     Status: None   Collection Time: 01/17/19  8:42 PM   Specimen: Nasal Mucosa; Nasopharyngeal  Result Value Ref Range Status   MRSA by PCR NEGATIVE NEGATIVE Final    Comment:        The GeneXpert MRSA Assay (FDA approved for NASAL specimens only), is one component of a comprehensive MRSA colonization surveillance program. It is not intended to diagnose MRSA infection nor to guide or monitor treatment for MRSA infections. Performed at [facility], [place] [address] [place], [place] [address]          Radiology Studies: Ct Chest Wo Contrast  Result Date: 01/20/2019 CLINICAL DATA:  78 year old male with unresolved pneumonia. Negative for COVID-19. On 01/17/2019. EXAM: CT CHEST WITHOUT CONTRAST TECHNIQUE: Multidetector CT imaging of the chest was performed following the standard protocol without IV contrast. COMPARISON:  Chest CT 08/18/2017. Portable chest today and earlier. FINDINGS: Cardiovascular: Calcified coronary artery atherosclerosis and/or stents (series 2, image 78). Stable cardiac size at the upper limits of normal. No pericardial effusion. Mild Calcified aortic atherosclerosis. Vascular patency is not evaluated in the absence of IV contrast. Mediastinum/Nodes: Thyroid appears to be surgically absent. Reactive appearing mediastinal lymph nodes measure up to 12 millimeter short axis (all subcentimeter in 2019). Lungs/Pleura: Layering bilateral pleural effusions, moderate on the left and small to moderate on the right. The major airways are patent. Near complete atelectasis or consolidation of the left lower lobe, with some air bronchograms. Chronic elevation of the left hemidiaphragm is similar. Right lower lobe basal segment atelectasis or consolidation, with air bronchograms. Superimposed dependent opacity in both upper lobes more resembles atelectasis, but there is also scattered peribronchial ground-glass opacity in the right upper lobe (series 7, image 47), right middle lobe (image 90) and along the right minor fissure which appears inflammatory. The left upper lobe and lingula appear spared. Upper Abdomen: Negative visible noncontrast liver, spleen, pancreas, adrenal glands and left kidney. Negative visible bowel. Right upper pole nephrolithiasis or renal vascular calcifications on series 2, image 147. Musculoskeletal: Partially visible cervical ACDF. No acute osseous abnormality identified. IMPRESSION: 1. Up to  moderate bilateral layering pleural effusions with bilateral lower lobe confluent atelectasis versus infectious consolidation. 2. Superimposed right upper and middle middle lobe peribronchial ground-glass opacity which is compatible with  bronchopneumonia. 3. Reactive appearing mediastinal lymph nodes. 4. Calcified coronary artery atherosclerosis. Mild cardiomegaly, no pericardial effusion. 5. Right nephrolithiasis or renal vascular calcifications. Electronically Signed   By: Genevie Ann M.D.   On: 01/20/2019 15:20   Dg Chest Port 1 View  Result Date: 01/20/2019 CLINICAL DATA:  Follow-up, ventilation EXAM: PORTABLE CHEST 1 VIEW COMPARISON:  01/18/2019 FINDINGS: Unchanged AP portable examination with layering bilateral pleural effusions, interstitial pulmonary opacity throughout with heterogeneous airspace opacity of the right midlung and lung base, and mild cardiomegaly. There is no endotracheal tube or other ventilation apparatus in the chest appreciated per stated indication. Surgical clips about the neck likely related to thyroidectomy. IMPRESSION: Unchanged AP portable examination with layering bilateral pleural effusions, interstitial pulmonary opacity throughout with heterogeneous airspace opacity of the right midlung and lung base, and mild cardiomegaly. There is no endotracheal tube or other ventilation apparatus appreciated in the chest per stated indication. Electronically Signed   By: Eddie Candle M.D.   On: 01/20/2019 09:14        Scheduled Meds:  allopurinol  300 mg Oral Daily   azithromycin  500 mg Oral Daily   baclofen  10 mg Oral TID   budesonide (PULMICORT) nebulizer solution  0.25 mg Nebulization BID   carbidopa-levodopa  1 tablet Oral Daily   chlorhexidine  15 mL Mouth Rinse BID   Chlorhexidine Gluconate Cloth  6 each Topical Daily   colchicine  0.6 mg Oral Daily   enoxaparin (LOVENOX) injection  40 mg Subcutaneous Q24H   furosemide  40 mg Intravenous Q12H   guaiFENesin   600 mg Oral BID   ipratropium-albuterol  3 mL Nebulization TID   latanoprost  1 drop Both Eyes QHS   levothyroxine  200 mcg Oral Q0600   mouth rinse  15 mL Mouth Rinse q12n4p   methylPREDNISolone (SOLU-MEDROL) injection  60 mg Intravenous Q12H   metoprolol succinate  12.5 mg Oral BID   multivitamin with minerals  1 tablet Oral Daily   pantoprazole  40 mg Oral QPM   polyethylene glycol  17 g Oral QODAY   simvastatin  40 mg Oral q1800   Continuous Infusions:  cefTRIAXone (ROCEPHIN)  IV Stopped (01/21/19 1013)     LOS: 4 days    Time spent: 34 min    Hosie Poisson, MD [facility] Pager [phone] If 7PM-7AM, please contact night-coverage [email or web address] Password [email or web address] 01/21/2019, 11:31 AM

## 2019-01-21 NOTE — Evaluation (Signed)
Occupational Therapy Evaluation Patient Details Name: [name] MRN: [id number] DOB: 1941/03/09 Today's Date: 01/21/2019    History of Present Illness 78 y.o. male with past medical history significant for coronary artery disease, COPD not on home oxygen, diabetes, restless leg syndrome, OSA on CPAP, ACDF C3-6, and admitted for Acute respiratory failure with  hypoxia and hypercapnia secondary to CAP and acute on chronic diastolic heart failure   Clinical Impression   Pt admitted with ARF. Pt currently with functional limitations due to the deficits listed below (see OT Problem List).  Pt will benefit from skilled OT to increase their safety and independence with ADL and functional mobility for ADL to facilitate discharge to venue listed below.   Pt needed significant A with OT this day. Pts wife would like to take pt home but pt would need to make significant proress.      Follow Up Recommendations  SNF;CIR;Home health OT;Supervision/Assistance - 24 hour    Equipment Recommendations  None recommended by OT    Recommendations for Other Services       Precautions / Restrictions Precautions Precautions: Fall Precaution Comments: monitor sats      Mobility Bed Mobility Overal bed mobility: Needs Assistance Bed Mobility: Supine to Sit Rolling: Max assist            Transfers Overall transfer level: Needs assistance Equipment used: Rolling walker (2 wheeled) Transfers: Sit to/from [facility] Sit to Stand: +2 physical assistance;+2 safety/equipment;Max assist Stand pivot transfers: +2 safety/equipment;Max assist;+2 physical assistance       General transfer comment: VC to stand upright and use BUE on walker    Balance Overall balance assessment: Needs assistance Sitting-balance support: Bilateral upper extremity supported;Feet unsupported Sitting balance-Leahy Scale: Fair     Standing balance support: Bilateral upper extremity supported Standing  balance-Leahy Scale: Fair                             ADL either performed or assessed with clinical judgement   ADL Overall ADL's : Needs assistance/impaired Eating/Feeding: Set up;Sitting   Grooming: Set up;Sitting   Upper Body Bathing: Set up;Sitting   Lower Body Bathing: Maximal assistance;Sit to/from stand;Cueing for safety;+2 for physical assistance;+2 for safety/equipment   Upper Body Dressing : Set up;Sitting   Lower Body Dressing: Maximal assistance;Sit to/from stand;Cueing for safety;+2 for physical assistance;+2 for safety/equipment   Toilet Transfer: +2 for physical assistance;+2 for safety/equipment;Stand-pivot;Cueing for safety;Cueing for sequencing;Maximal assistance Toilet Transfer Details (indicate cue type and reason): bed  to chair Toileting- Clothing Manipulation and Hygiene: Sit to/from stand;+2 for physical assistance;+2 for safety/equipment;Total assistance;Cueing for safety                            Pertinent Vitals/Pain Pain Assessment: No/denies pain     Hand Dominance     Extremity/Trunk Assessment Upper Extremity Assessment Upper Extremity Assessment: Generalized weakness           Communication Communication Communication: No difficulties   Cognition Arousal/Alertness: Awake/alert Behavior During Therapy: WFL for tasks assessed/performed Overall Cognitive Status: Within Functional Limits for tasks assessed                                                Home Living Family/patient expects to be  discharged to:: Private residence Living Arrangements: Spouse/significant other Available Help at Discharge: Family;Available 24 hours/day;Personal care attendant Type of Home: House Home Access: Ramped entrance     Home Layout: One level     Bathroom Shower/Tub: Walk-in shower         Home Equipment: Environmental consultant - 2 wheels          Prior Functioning/Environment Level of Independence: Needs  assistance  Gait / Transfers Assistance Needed: pt states he can ambulate short distance with RW however mostly remains in bed, has caregiver 3 hrs daily to assits with bathing     Comments: per chart review from previous admission, was using hoyer lift at home 2019        OT Problem List: Decreased strength;Decreased activity tolerance;Impaired balance (sitting and/or standing);Decreased safety awareness;Decreased knowledge of use of DME or AE;Obesity      OT Treatment/Interventions: Self-care/ADL training;Therapeutic exercise;DME and/or AE instruction;Patient/family education    OT Goals(Current goals can be found in the care plan section) Acute Rehab OT Goals Patient Stated Goal: OOB this day OT Goal Formulation: With patient/family Time For Goal Achievement: 02/04/19  OT Frequency: Min 2X/week    AM-PAC OT "6 Clicks" Daily Activity     Outcome Measure Help from another person eating meals?: A Little Help from another person taking care of personal grooming?: A Little Help from another person toileting, which includes using toliet, bedpan, or urinal?: Total Help from another person bathing (including washing, rinsing, drying)?: A Lot Help from another person to put on and taking off regular upper body clothing?: A Little Help from another person to put on and taking off regular lower body clothing?: Total 6 Click Score: 13   End of Session Equipment Utilized During Treatment: Rolling walker Nurse Communication: Mobility status  Activity Tolerance: Patient tolerated treatment well Patient left: in chair;with call bell/phone within reach;with nursing/sitter in room;with family/visitor present  OT Visit Diagnosis: Unsteadiness on feet (R26.81)                Time: 1430-1455 OT Time Calculation (min): 25 min Charges:  OT General Charges $OT Visit: 1 Visit OT Evaluation $OT Eval Moderate Complexity: 1 Mod  Kari Baars, OT Acute Rehabilitation Services [phone] Office- [phone]     Fahmida Jurich, Edwena Felty D 01/21/2019, 4:29 PM

## 2019-01-22 DIAGNOSIS — M10072 Idiopathic gout, left ankle and foot: Secondary | ICD-10-CM

## 2019-01-22 DIAGNOSIS — G4730 Sleep apnea, unspecified: Secondary | ICD-10-CM

## 2019-01-22 LAB — CULTURE, BLOOD (ROUTINE X 2)
Culture: NO GROWTH
Culture: NO GROWTH
Special Requests: ADEQUATE
Special Requests: ADEQUATE

## 2019-01-22 MED ORDER — POLYETHYLENE GLYCOL 3350 17 G PO PACK: 17.0000 g | PACK | Freq: Every day | ORAL | Status: DC | PRN

## 2019-01-22 MED ORDER — SODIUM CHLORIDE 0.9 % IV SOLN
INTRAVENOUS | Status: DC | PRN
  Administered 2019-01-22: 1000 mL via INTRAVENOUS
  Administered 2019-01-25: 250 mL via INTRAVENOUS

## 2019-01-22 MED ORDER — PREDNISONE 20 MG PO TABS
20.0000 mg | ORAL_TABLET | Freq: Every day | ORAL | Status: DC
  Administered 2019-01-23 – 2019-01-24 (×2): 20 mg via ORAL
  Filled 2019-01-22 (×2): qty 1

## 2019-01-22 MED ORDER — POTASSIUM CHLORIDE CRYS ER 20 MEQ PO TBCR
20.0000 meq | EXTENDED_RELEASE_TABLET | Freq: Two times a day (BID) | ORAL | Status: DC
  Administered 2019-01-22 (×2): 20 meq via ORAL
  Filled 2019-01-22 (×2): qty 1

## 2019-01-22 NOTE — Progress Notes (Signed)
Pt switched back to V60 BIPAP because pt was unable to maintain sats >89% with 15 lpm O2 bleed in.  RT to monitor and assess as needed.

## 2019-01-22 NOTE — Progress Notes (Addendum)
[name]  PROGRESS NOTE    SOHAIL CAPRARO  ZJQ:[id number] DOB: [birth year] DOA: 01/17/2019 PCP: [name], MD   Brief Narrative:   Cesar Heft Marshis a 78 y.o.malewith past medical history significant for coronary artery disease, COPD not on home oxygen, diabetes, restless leg syndrome, OSA on CPAP, who presents complaining of worsening cough and shortness of breath for the last 2 days prior to admission. On arrival he was febrile, and CXR shows right sided pneumonia.    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      - ABG shows respiratory acidosis due to hypercapnia and hypoxia.      - BIPAP as needed. Repeat      - ABG shows PH of 7. 34, but persistent elevation of PCO2.     - IV rocephin and zithromax; end 01/23/2019.      - Tapering steroids     - Bld Cx NTD     - White count down to 12, afebrile ON     - CT of the chest without contrast showed bilateral layering pleural effusions and right mid lobe consolidation.      - PCCM consulted; appreciate assistance     - had to go back on BiPap last night d/t difficulty maintaining SpO2 on 15L. He tells me that he uses CPap at night, so we can have him on the equivalent while sleeping; currently on 5-6L  during my interveiw.   Sepsis possibly from proteus UTI and community acquired pneumonia.      - Fever resolved,  blood cultures have been negative.      - Urine cultures show proteus, sensitive to rocephin  Acute on chronic diastolic heart failure:     - Improving     - Continue with IV lasix 40 mg BID.      -  Continue with strict intake and output.      - Creatine appears to be at baseline with IV diuresis.      - Replace potassium.   COPD:      - Resume duonebs. Pt not wheezing at this time.   Gout:      - Continue with colchicine and allopurinol.   Mild anemia of chronic disease:      - Hemoglobin stable between 11 to 12.   Hypertension:      - BP acceptable      - lasix  Hypokalemia:      - replete, monitor  Debility/Generalized weakness     - 2-3 person max assist to transfer from bed to chair     - lives w/ wife at home; doubtful she has the ability on own to assist him around house     - he does note that he works w/ walker and wheelchair at home  DVT prophylaxis: lovenox Code Status: DNR Family Communication: none at bedside   Disposition Plan: TBD  Consultants:   PCCM  Antimicrobials:   Rocephin, azithromycin   ROS:  Denies CP, palpitations, HA, N, V, fevers, chills .  Remainder 10-pt ROS is negative for all not previously mentioned.  Subjective: "It really was going on for at least a month."  Objective: Vitals:   01/22/19 0330 01/22/19 0400 01/22/19 0455 01/22/19 0500  BP:  (!) 144/75    Pulse: 68 66 65 62  Resp: 15 12 11 14   Temp: 97.9 F (36.6 C)     TempSrc: Oral     SpO2:  96% 97% 98%  Weight:    89.3 kg  Height:        Intake/Output Summary (Last 24 hours) at 01/22/2019 0716 Last data filed at 01/22/2019 0229 Gross per 24 hour  Intake 480 ml  Output 575 ml  Net -95 ml   Filed Weights   01/20/19 0426 01/21/19 0500 01/22/19 0500  Weight: 91 kg 90 kg 89.3 kg    Examination:  General: 78 y.o. male resting in bed in NAD Eyes: PERRL, normal sclera ENMT: Nares patent w/o discharge, orophaynx clear, dentition normal, ears w/o discharge/lesions/ulcers Cardiovascular: RRR, +S1, S2, no m/g/r, equal pulses throughout Respiratory: decreased at bases, but clear otherwise, no w/r/r, normal WOB GI: BS+, NDNT, no masses noted, no organomegaly  noted MSK: No e/c/c Skin: No rashes, bruises, ulcerations noted Neuro: alert to name, follows commands Psyc: Appropriate interaction and affect, calm/cooperative   Data Reviewed: I have personally reviewed following labs and imaging studies.  CBC: Recent Labs  Lab 01/17/19 1112 01/17/19 2246 01/18/19 1922 01/19/19 0319 01/20/19 0223 01/21/19 1204  WBC 19.7* 14.9* 16.8* 15.3* 13.8* 12.1*  NEUTROABS 17.4*  --  13.9*  --   --  10.4*  HGB 12.1* 10.9* 12.8* 12.4* 11.8* 13.7  HCT 37.6* 35.6* 41.8 40.2 39.9 42.9  MCV 91.5 93.0 93.7 94.6 95.5 91.9  PLT 220 198 226 230 247 299   Basic Metabolic Panel: Recent Labs  Lab 01/17/19 1112 01/17/19 2246 01/18/19 1922 01/19/19 0319 01/20/19 0223 01/21/19 1204  NA 141 142 141 141 144 141  K 2.9* 3.6 3.3* 3.2* 3.8 3.3*  CL 99 100 96* 96* 93* 89*  CO2 29 32 32 34* 42* 37*  GLUCOSE 100* 107* 109* 107* 107* 165*  BUN 24* 25* 17 21 23  28*  CREATININE 0.98 1.03 0.88 0.96 0.98 0.80  CALCIUM 8.9 8.5* 8.1* 8.2* 8.3* 8.7*  MG 2.1  --   --   --   --   --    GFR: Estimated Creatinine Clearance: 82.5 mL/min (by C-G formula based on SCr of 0.8 mg/dL). Liver Function Tests: Recent Labs  Lab 01/17/19 1112  AST 28  ALT 13  ALKPHOS 48  BILITOT 0.8  PROT 7.2  ALBUMIN 3.7   No results for input(s): LIPASE, AMYLASE in the last 168 hours. No results for input(s): AMMONIA in the last 168 hours. Coagulation Profile: Recent Labs  Lab 01/17/19 1112  INR 1.1   Cardiac Enzymes: No results for input(s): CKTOTAL, CKMB, CKMBINDEX, TROPONINI in the last 168 hours. BNP (last 3 results) No results for input(s): PROBNP in the last 8760 hours. HbA1C: No results for input(s): HGBA1C in the last 72 hours. CBG: No results for input(s): GLUCAP in the last 168 hours. Lipid Profile: No results for input(s): CHOL, HDL, LDLCALC, TRIG, CHOLHDL, LDLDIRECT in the last 72 hours. Thyroid Function Tests: No results for input(s): TSH, T4TOTAL, FREET4, T3FREE,  THYROIDAB in the last 72 hours. Anemia Panel: No results for input(s): VITAMINB12, FOLATE, FERRITIN, TIBC, IRON, RETICCTPCT in the last 72 hours. Sepsis Labs: Recent Labs  Lab 01/17/19  1112 01/17/19 1320 01/17/19 1955 01/17/19 2246  LATICACIDVEN 3.3* 2.4* 2.0* 1.5    Recent Results (from the past 240 hour(s))  Blood Culture (routine x 2)     Status: None (Preliminary result)   Collection Time: 01/17/19 11:12 AM   Specimen: BLOOD  Result Value Ref Range Status   Specimen Description   Final    BLOOD RIGHT ARM Performed at [facility] [address], [place], [place] [address]    Special Requests   Final    BOTTLES DRAWN AEROBIC AND ANAEROBIC Blood Culture adequate volume Performed at [facility] [address], [place], [place] [address]    Culture   Final    NO GROWTH 4 DAYS Performed at [facility], [place] [address], [place], [place] [address]    Report Status PENDING  Incomplete  Blood Culture (routine x 2)     Status: None (Preliminary result)   Collection Time: 01/17/19 11:12 AM   Specimen: BLOOD  Result Value Ref Range Status   Specimen Description   Final    BLOOD LEFT ARM Performed at [facility] [address], [place], [place] [address]    Special Requests   Final    BOTTLES DRAWN AEROBIC AND ANAEROBIC Blood Culture adequate volume Performed at [facility] [address] [place], [place] [address]    Culture   Final    NO GROWTH 4 DAYS Performed at [facility], [place] [address], [place], [place] [address]    Report Status PENDING  Incomplete  SARS Coronavirus 2 [facility] order, Performed in [facility] hospital lab) Nasopharyngeal Nasopharyngeal Swab     Status: None   Collection Time: 01/17/19 11:12 AM   Specimen: Nasopharyngeal Swab  Result Value Ref Range Status   SARS Coronavirus 2 NEGATIVE NEGATIVE Final    Comment: (NOTE) If result is NEGATIVE SARS-CoV-2  target nucleic acids are NOT DETECTED. The SARS-CoV-2 RNA is generally detectable in upper and lower  respiratory specimens during the acute phase of infection. The lowest  concentration of SARS-CoV-2 viral copies this assay can detect is 250  copies / mL. A negative result does not preclude SARS-CoV-2 infection  and should not be used as the sole basis for treatment or other  patient management decisions.  A negative result may occur with  improper specimen collection / handling, submission of specimen other  than nasopharyngeal swab, presence of viral mutation(s) within the  areas targeted by this assay, and inadequate number of viral copies  (<250 copies / mL). A negative result must be combined with clinical  observations, patient history, and epidemiological information. If result is POSITIVE SARS-CoV-2 target nucleic acids are DETECTED. The SARS-CoV-2 RNA is generally detectable in upper and lower  respiratory specimens dur ing the acute phase of infection.  Positive  results are indicative of active infection with SARS-CoV-2.  Clinical  correlation with patient history and other diagnostic information is  necessary to determine patient infection status.  Positive results do  not rule out bacterial infection or co-infection with other viruses. If result is PRESUMPTIVE POSTIVE SARS-CoV-2 nucleic acids MAY BE PRESENT.   A presumptive positive result was obtained on the submitted specimen  and confirmed on repeat testing.  While 2019 novel coronavirus  (SARS-CoV-2) nucleic acids may be present in the submitted sample  additional confirmatory testing may be necessary for epidemiological  and / or clinical management purposes  to differentiate between  SARS-CoV-2 and other Sarbecovirus currently known to infect humans.  If clinically indicated additional testing with an alternate test  methodology [phone]) is advised. The SARS-CoV-2 RNA is generally  detectable in upper and lower  respiratory sp ecimens during the acute  phase of infection. The expected result is Negative. Fact Sheet for Patients:  StrictlyIdeas.no Fact Sheet for Healthcare Providers: BankingDealers.co.za This test is not yet approved or cleared by the [place] FDA and has been authorized for detection and/or diagnosis of SARS-CoV-2 by FDA under an Emergency Use Authorization (EUA).  This EUA will remain in effect (meaning this test can be used) for the duration of the COVID-19 declaration under Section 564(b)(1) of the Act, 21 U.S.C. section 360bbb-3(b)(1), unless the authorization is terminated or revoked sooner. Performed at [facility], [place] [address], [place], [place] [address]   Urine culture     Status: Abnormal   Collection Time: 01/17/19 12:34 PM   Specimen: In/Out Cath Urine  Result Value Ref Range Status   Specimen Description   Final    IN/OUT CATH URINE Performed at [facility] [address], [place], [place] [address]    Special Requests   Final    NONE Performed at [facility], [place] [address] [place], [place] [address]    Culture (A)  Final    80,000 COLONIES/mL PROTEUS MIRABILIS 80,000 COLONIES/mL DIPHTHEROIDS(CORYNEBACTERIUM SPECIES) Standardized susceptibility testing for this organism is not available. Performed at [facility], [place] [address] [place], [place] [address]    Report Status 01/21/2019 FINAL  Final   Organism ID, Bacteria PROTEUS MIRABILIS (A)  Final      Susceptibility   Proteus mirabilis - MIC*    AMPICILLIN <=2 SENSITIVE Sensitive     CEFAZOLIN <=4 SENSITIVE Sensitive     CEFTRIAXONE <=1 SENSITIVE Sensitive     CIPROFLOXACIN >=4 RESISTANT Resistant     GENTAMICIN <=1 SENSITIVE Sensitive     IMIPENEM 1 SENSITIVE Sensitive     NITROFURANTOIN >=512 RESISTANT Resistant     TRIMETH/SULFA <=20 SENSITIVE Sensitive      AMPICILLIN/SULBACTAM <=2 SENSITIVE Sensitive     PIP/TAZO <=4 SENSITIVE Sensitive     * 80,000 COLONIES/mL PROTEUS MIRABILIS  MRSA PCR Screening     Status: None   Collection Time: 01/17/19  8:42 PM   Specimen: Nasal Mucosa; Nasopharyngeal  Result Value Ref Range Status   MRSA by PCR NEGATIVE NEGATIVE Final    Comment:        The GeneXpert MRSA Assay (FDA approved for NASAL specimens only), is one component of a comprehensive MRSA colonization surveillance program. It is not intended to diagnose MRSA infection nor to guide or monitor treatment for MRSA infections. Performed at [facility], [place] [address] [place], [place] [address]       Radiology Studies: Ct Chest Wo Contrast  Result Date: 01/20/2019 CLINICAL DATA:  78 year old male with unresolved pneumonia. Negative for COVID-19. On 01/17/2019. EXAM: CT CHEST WITHOUT CONTRAST TECHNIQUE: Multidetector CT imaging of the chest was performed following the standard protocol without IV contrast. COMPARISON:  Chest CT 08/18/2017. Portable chest today and earlier. FINDINGS: Cardiovascular: Calcified coronary artery atherosclerosis and/or stents (series 2, image 78). Stable cardiac size at the upper limits of normal. No pericardial effusion. Mild Calcified aortic atherosclerosis. Vascular patency is not evaluated in the absence of IV contrast. Mediastinum/Nodes: Thyroid appears to be surgically absent. Reactive appearing mediastinal lymph nodes measure up to 12 millimeter  short axis (all subcentimeter in 2019). Lungs/Pleura: Layering bilateral pleural effusions, moderate on the left and small to moderate on the right. The major airways are patent. Near complete atelectasis or consolidation of the left lower lobe, with some air bronchograms. Chronic elevation of the left hemidiaphragm is similar. Right lower lobe basal segment atelectasis or consolidation, with air bronchograms. Superimposed dependent opacity in both upper lobes  more resembles atelectasis, but there is also scattered peribronchial ground-glass opacity in the right upper lobe (series 7, image 47), right middle lobe (image 90) and along the right minor fissure which appears inflammatory. The left upper lobe and lingula appear spared. Upper Abdomen: Negative visible noncontrast liver, spleen, pancreas, adrenal glands and left kidney. Negative visible bowel. Right upper pole nephrolithiasis or renal vascular calcifications on series 2, image 147. Musculoskeletal: Partially visible cervical ACDF. No acute osseous abnormality identified. IMPRESSION: 1. Up to moderate bilateral layering pleural effusions with bilateral lower lobe confluent atelectasis versus infectious consolidation. 2. Superimposed right upper and middle middle lobe peribronchial ground-glass opacity which is compatible with bronchopneumonia. 3. Reactive appearing mediastinal lymph nodes. 4. Calcified coronary artery atherosclerosis. Mild cardiomegaly, no pericardial effusion. 5. Right nephrolithiasis or renal vascular calcifications. Electronically Signed   By: Genevie Ann M.D.   On: 01/20/2019 15:20   Dg Chest Port 1 View  Result Date: 01/21/2019 CLINICAL DATA:  Hypertension.  Shortness of breath. EXAM: PORTABLE CHEST 1 VIEW COMPARISON:  January 20, 2019 FINDINGS: There is cardiomegaly with moderate-sized bilateral pleural effusions and similar bibasilar airspace opacities. No pneumothorax. There is generalized volume overload. There is no acute osseous abnormality. IMPRESSION: Stable appearance of the chest as detailed above. Electronically Signed   By: Constance Holster M.D.   On: 01/21/2019 14:14   Dg Chest Port 1 View  Result Date: 01/20/2019 CLINICAL DATA:  Follow-up, ventilation EXAM: PORTABLE CHEST 1 VIEW COMPARISON:  01/18/2019 FINDINGS: Unchanged AP portable examination with layering bilateral pleural effusions, interstitial pulmonary opacity throughout with heterogeneous airspace opacity of the right  midlung and lung base, and mild cardiomegaly. There is no endotracheal tube or other ventilation apparatus in the chest appreciated per stated indication. Surgical clips about the neck likely related to thyroidectomy. IMPRESSION: Unchanged AP portable examination with layering bilateral pleural effusions, interstitial pulmonary opacity throughout with heterogeneous airspace opacity of the right midlung and lung base, and mild cardiomegaly. There is no endotracheal tube or other ventilation apparatus appreciated in the chest per stated indication. Electronically Signed   By: Eddie Candle M.D.   On: 01/20/2019 09:14     Scheduled Meds:  allopurinol  300 mg Oral Daily   azithromycin  500 mg Oral Daily   baclofen  10 mg Oral TID   budesonide (PULMICORT) nebulizer solution  0.25 mg Nebulization BID   carbidopa-levodopa  1 tablet Oral Daily   chlorhexidine  15 mL Mouth Rinse BID   Chlorhexidine Gluconate Cloth  6 each Topical Daily   colchicine  0.6 mg Oral Daily   enoxaparin (LOVENOX) injection  40 mg Subcutaneous Q24H   furosemide  40 mg Intravenous Q12H   guaiFENesin  600 mg Oral BID   ipratropium-albuterol  3 mL Nebulization TID   latanoprost  1 drop Both Eyes QHS   levothyroxine  200 mcg Oral Q0600   mouth rinse  15 mL Mouth Rinse q12n4p   methylPREDNISolone (SOLU-MEDROL) injection  40 mg Intravenous Q12H   metoprolol succinate  12.5 mg Oral BID   multivitamin with minerals  1 tablet Oral Daily  pantoprazole  40 mg Oral QPM   polyethylene glycol  17 g Oral QODAY   simvastatin  40 mg Oral q1800   Continuous Infusions:  cefTRIAXone (ROCEPHIN)  IV Stopped (01/21/19 1013)     LOS: 5 days    Time spent: 35 minutes spent in the coordination of care today.   Jonnie Finner, DO [facility] Pager [phone]  If 7PM-7AM, please contact night-coverage [email or web address] Password [email or web address] 01/22/2019, 7:16 AM

## 2019-01-23 ENCOUNTER — Inpatient Hospital Stay (HOSPITAL_COMMUNITY): Payer: Medicare Other

## 2019-01-23 ENCOUNTER — Encounter (HOSPITAL_COMMUNITY): Payer: Self-pay | Admitting: Cardiology

## 2019-01-23 DIAGNOSIS — I5033 Acute on chronic diastolic (congestive) heart failure: Secondary | ICD-10-CM

## 2019-01-23 LAB — CBC WITH DIFFERENTIAL/PLATELET
Abs Immature Granulocytes: 0.26 10*3/uL — ABNORMAL HIGH (ref 0.00–0.07)
Basophils Absolute: 0.1 10*3/uL (ref 0.0–0.1)
Basophils Relative: 1 %
Eosinophils Absolute: 0.2 10*3/uL (ref 0.0–0.5)
Eosinophils Relative: 1 %
HCT: 43.1 % (ref 39.0–52.0)
Hemoglobin: 13.3 g/dL (ref 13.0–17.0)
Immature Granulocytes: 2 %
Lymphocytes Relative: 16 %
Lymphs Abs: 2 10*3/uL (ref 0.7–4.0)
MCH: 28.6 pg (ref 26.0–34.0)
MCHC: 30.9 g/dL (ref 30.0–36.0)
MCV: 92.7 fL (ref 80.0–100.0)
Monocytes Absolute: 1.1 10*3/uL — ABNORMAL HIGH (ref 0.1–1.0)
Monocytes Relative: 9 %
Neutro Abs: 9 10*3/uL — ABNORMAL HIGH (ref 1.7–7.7)
Neutrophils Relative %: 71 %
Platelets: 260 10*3/uL (ref 150–400)
RBC: 4.65 MIL/uL (ref 4.22–5.81)
RDW: 15.2 % (ref 11.5–15.5)
WBC: 12.7 10*3/uL — ABNORMAL HIGH (ref 4.0–10.5)
nRBC: 0 % (ref 0.0–0.2)

## 2019-01-23 LAB — RENAL FUNCTION PANEL
Albumin: 3.5 g/dL (ref 3.5–5.0)
Anion gap: 13 (ref 5–15)
BUN: 31 mg/dL — ABNORMAL HIGH (ref 8–23)
CO2: 38 mmol/L — ABNORMAL HIGH (ref 22–32)
Calcium: 8.9 mg/dL (ref 8.9–10.3)
Chloride: 92 mmol/L — ABNORMAL LOW (ref 98–111)
Creatinine, Ser: 0.92 mg/dL (ref 0.61–1.24)
GFR calc Af Amer: >60 mL/min (ref >60)
GFR calc non Af Amer: >60 mL/min (ref >60)
Glucose, Bld: 103 mg/dL — ABNORMAL HIGH (ref 70–99)
Phosphorus: 3.2 mg/dL (ref 2.5–4.6)
Potassium: 3.1 mmol/L — ABNORMAL LOW (ref 3.5–5.1)
Sodium: 143 mmol/L (ref 135–145)

## 2019-01-23 LAB — MAGNESIUM: Magnesium: 2.2 mg/dL (ref 1.7–2.4)

## 2019-01-23 IMAGING — DX PORTABLE CHEST - 1 VIEW
1 series · 1 of 1 positions shown · non-contrast
Comparison: [DATE], CT [DATE]

CLINICAL DATA: Worsening cough and shortness of breath

EXAM:
PORTABLE CHEST 1 VIEW

[chest ap]
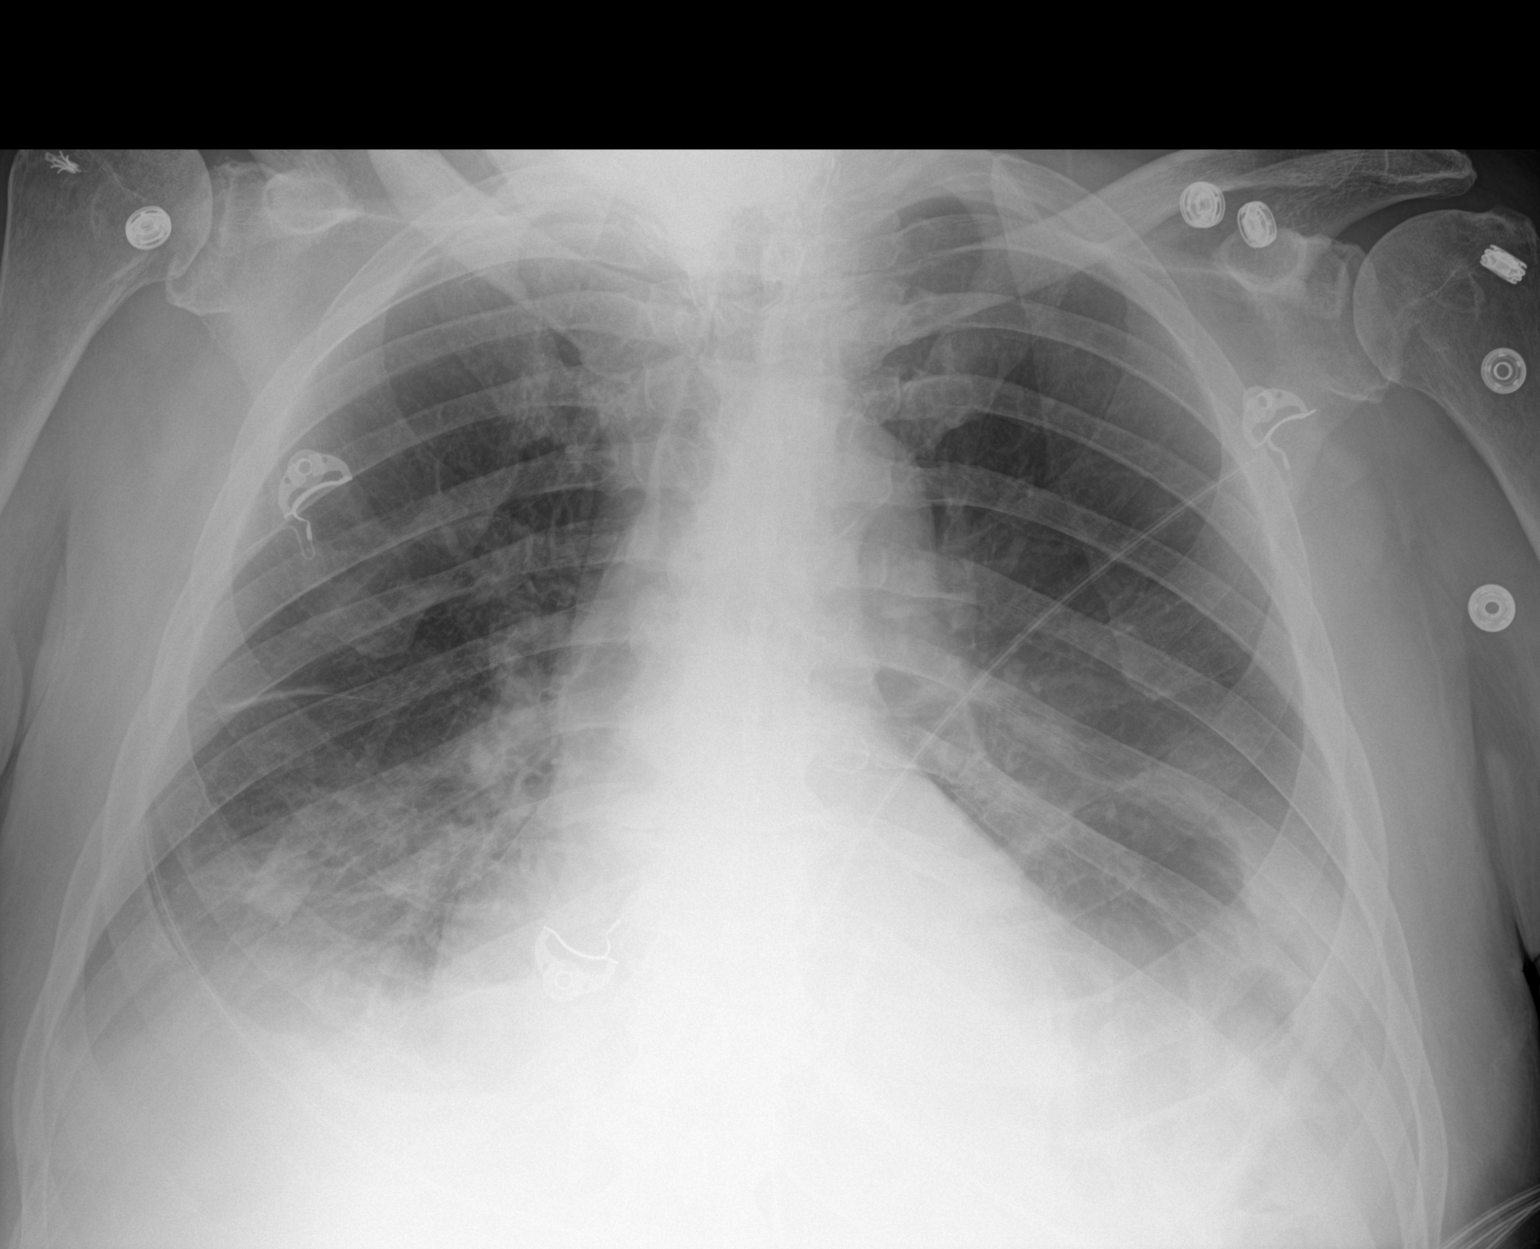

[1 of 1 positions shown; findings below may reference images not displayed]

FINDINGS: Moderate bilateral pleural effusions, similar compared to prior
radiograph. Continued bibasilar consolidations. Enlarged
cardiomediastinal silhouette with central vascular congestion. No
pneumothorax.
IMPRESSION: No significant change in moderate bilateral pleural effusions and
bibasilar airspace disease. Cardiomegaly.

## 2019-01-23 MED ORDER — FUROSEMIDE 10 MG/ML IJ SOLN
60.0000 mg | Freq: Two times a day (BID) | INTRAMUSCULAR | Status: DC
  Administered 2019-01-23 – 2019-01-24 (×3): 60 mg via INTRAVENOUS
  Filled 2019-01-23 (×3): qty 6

## 2019-01-23 MED ORDER — POTASSIUM CHLORIDE CRYS ER 20 MEQ PO TBCR
40.0000 meq | EXTENDED_RELEASE_TABLET | Freq: Two times a day (BID) | ORAL | Status: DC
  Administered 2019-01-23 – 2019-01-28 (×11): 40 meq via ORAL
  Filled 2019-01-23 (×11): qty 2

## 2019-01-23 MED ORDER — POTASSIUM CHLORIDE CRYS ER 20 MEQ PO TBCR
40.0000 meq | EXTENDED_RELEASE_TABLET | Freq: Once | ORAL | Status: AC
  Administered 2019-01-23: 40 meq via ORAL
  Filled 2019-01-23: qty 2

## 2019-01-23 NOTE — Progress Notes (Signed)
Physical Therapy Treatment Patient Details Name: [name] MRN: [id number] DOB: Aug 13, 1940 Today's Date: 01/23/2019    History of Present Illness 78 y.o. male with past medical history significant for coronary artery disease, COPD not on home oxygen, diabetes, restless leg syndrome, OSA on CPAP, ACDF C3-6, and admitted for Acute respiratory failure with  hypoxia and hypercapnia secondary to CAP and acute on chronic diastolic heart failure    PT Comments    Pt progressed to ambulate short distance in room but requiring increased time and significant assist for safe performance of all mobility tasks.   Follow Up Recommendations  Home health PT;SNF(dependent on acute stay progress and home assist level)     Equipment Recommendations  None recommended by PT    Recommendations for Other Services       Precautions / Restrictions Precautions Precautions: Fall Precaution Comments: monitor sats Restrictions Weight Bearing Restrictions: No    Mobility  Bed Mobility Overal bed mobility: Needs Assistance Bed Mobility: Supine to Sit     Supine to sit: +2 for physical assistance;+2 for safety/equipment;Min assist;Mod assist     General bed mobility comments: HOB elevated, increased time, use of bedrail  Transfers Overall transfer level: Needs assistance Equipment used: Rolling walker (2 wheeled) Transfers: Sit to/from [employer] Transfers Sit to Stand: +2 safety/equipment;Min assist;Mod assist;+2 physical assistance Stand pivot transfers: Min assist;Mod assist;+2 physical assistance;+2 safety/equipment       General transfer comment: cues for use of UEs to self assist.  Physical assist to bring wt up and fwd and to balance in standing  Ambulation/Gait Ambulation/Gait assistance: Min assist;Mod assist;+2 physical assistance;+2 safety/equipment Gait Distance (Feet): 7 Feet Assistive device: Rolling walker (2 wheeled) Gait Pattern/deviations: Step-to pattern;Decreased  step length - right;Decreased step length - left;Shuffle;Trunk flexed Gait velocity: decr   General Gait Details: cues for posture and position from RW.  Physical assist for support (knees buckling) and balance   Stairs             Wheelchair Mobility    Modified Rankin (Stroke Patients Only)       Balance Overall balance assessment: Needs assistance Sitting-balance support: Feet supported;No upper extremity supported Sitting balance-Leahy Scale: Fair     Standing balance support: Bilateral upper extremity supported Standing balance-Leahy Scale: Poor                              Cognition Arousal/Alertness: Awake/alert Behavior During Therapy: WFL for tasks assessed/performed Overall Cognitive Status: Within Functional Limits for tasks assessed                                        Exercises      General Comments        Pertinent Vitals/Pain Pain Assessment: No/denies pain    Home Living                      Prior Function            PT Goals (current goals can now be found in the care plan section) Acute Rehab PT Goals Patient Stated Goal: OOB this day PT Goal Formulation: With patient Time For Goal Achievement: 02/03/19 Potential to Achieve Goals: Fair Progress towards PT goals: Progressing toward goals    Frequency    Min 3X/week      PT  Plan Discharge plan needs to be updated    Co-evaluation              AM-PAC PT "6 Clicks" Mobility   Outcome Measure  Help needed turning from your back to your side while in a flat bed without using bedrails?: A Little Help needed moving from lying on your back to sitting on the side of a flat bed without using bedrails?: A Lot Help needed moving to and from a bed to a chair (including a wheelchair)?: A Lot Help needed standing up from a chair using your arms (e.g., wheelchair or bedside chair)?: A Lot Help needed to walk in hospital room?: A Lot Help  needed climbing 3-5 steps with a railing? : A Lot 6 Click Score: 13    End of Session Equipment Utilized During Treatment: Gait belt Activity Tolerance: Patient tolerated treatment well;Patient limited by fatigue Patient left: in chair;with call bell/phone within reach;with chair alarm set Nurse Communication: Mobility status PT Visit Diagnosis: Other abnormalities of gait and mobility (R26.89);Difficulty in walking, not elsewhere classified (R26.2)     Time: 4709-2957 PT Time Calculation (min) (ACUTE ONLY): 38 min  Charges:  $Gait Training: 8-22 mins $Therapeutic Activity: 8-22 mins                     [place] Pager [phone] Office [phone]    Keng Jewel 01/23/2019, 1:32 PM

## 2019-01-23 NOTE — Progress Notes (Signed)
Occupational Therapy Treatment Patient Details Name: [name] MRN: [id number] DOB: Nov 11, 1940 Today's Date: 01/23/2019    History of present illness 78 y.o. male with past medical history significant for coronary artery disease, COPD not on home oxygen, diabetes, restless leg syndrome, OSA on CPAP, ACDF C3-6, and admitted for Acute respiratory failure with  hypoxia and hypercapnia secondary to CAP and acute on chronic diastolic heart failure   OT comments  Pt agreeable to grooming and sit to stand with OT. Pt needed significant A.  Wife not present  Follow Up Recommendations  SNF;CIR;Home health OT;Supervision/Assistance - 24 hour    Equipment Recommendations  None recommended by OT    Recommendations for Other Services      Precautions / Restrictions Precautions Precautions: Fall Precaution Comments: monitor sats Restrictions Weight Bearing Restrictions: No       Mobility Bed Mobility               General bed mobility comments: pt in chair  Transfers   Equipment used: Rolling walker (2 wheeled) Transfers: Sit to/from Stand Sit to Stand: Max assist;+2 physical assistance;+2 safety/equipment         General transfer comment: cues for use of UEs to self assist.  Physical assist to bring wt up and fwd and to balance in standing    Balance Overall balance assessment: Needs assistance Sitting-balance support: Feet supported;No upper extremity supported Sitting balance-Leahy Scale: Fair     Standing balance support: Bilateral upper extremity supported Standing balance-Leahy Scale: Poor                             ADL either performed or assessed with clinical judgement   ADL Overall ADL's : Needs assistance/impaired Eating/Feeding: Set up;Sitting   Grooming: Wash/dry face;Sitting;Set up               Lower Body Dressing: Maximal assistance;+2 for safety/equipment;Sit to/from stand;Cueing for safety;Cueing for sequencing        Toileting- Clothing Manipulation and Hygiene: Sit to/from stand;+2 for physical assistance;+2 for safety/equipment;Total assistance;Cueing for safety                         Cognition Arousal/Alertness: Awake/alert Behavior During Therapy: WFL for tasks assessed/performed Overall Cognitive Status: Within Functional Limits for tasks assessed                                           Prior Functioning/Environment              Frequency  Min 2X/week        Progress Toward Goals  OT Goals(current goals can now be found in the care plan section)  Progress towards OT goals: Progressing toward goals     Plan Discharge plan remains appropriate       AM-PAC OT "6 Clicks" Daily Activity     Outcome Measure   Help from another person eating meals?: A Little Help from another person taking care of personal grooming?: A Little Help from another person toileting, which includes using toliet, bedpan, or urinal?: Total Help from another person bathing (including washing, rinsing, drying)?: A Lot Help from another person to put on and taking off regular upper body clothing?: A Little Help from another person to put on and taking off regular lower body  clothing?: Total 6 Click Score: 13    End of Session Equipment Utilized During Treatment: Rolling walker  OT Visit Diagnosis: Unsteadiness on feet (R26.81)   Activity Tolerance Patient tolerated treatment well   Patient Left in chair;with call bell/phone within reach;with nursing/sitter in room;with family/visitor present   Nurse Communication Mobility status        Time: 5913-6859 OT Time Calculation (min): 13 min  Charges: OT General Charges $OT Visit: 1 Visit OT Treatments $Self Care/Home Management : 8-22 mins  Kari Baars, [place] [phone] Office- [phone]      Jamee Pacholski, Edwena Felty D 01/23/2019, 5:02 PM

## 2019-01-23 NOTE — Progress Notes (Signed)
Cardiology Consultation:   Patient ID: [name]; [id number]; [birth year]   Admit date: 01/17/2019 Date of Consult: 01/23/2019  Primary Care Provider: [name], MD Primary Cardiologist: No primary care provider on file.   New  Primary Electrophysiologist:  NONE   Patient Profile:   [name] is a 78 y.o. male with a hx of COPD who is being seen today for the evaluation of hypoxemia at the request of Dr. Marylyn Ishihara.  History of Present Illness:   [name] was admitted with increased dyspnea.  He is found to have acute on chronic hypoxemia with right lower lobe pneumonia.  He has been treated with steroids and IV antibiotics.  He has had some evidence of volume overload and has been diuresed.  However, he continues to have increased O2 requirements.    The patient has not had a cardiac history.  He did have a cath years ago and there was some vague mention of a vessel that was missing but I don't have any of these records.  He denies any recent cardiovascular symptoms.  The patient denies any new symptoms such as chest discomfort, neck or arm discomfort. There has been no new  PND or orthopnea. There have been no reported palpitations, presyncope or syncope.   He is limited in his activities with back problems and his chronic lung disease.  He presented after having increased cough and dyspnea.  He was noted at home to have decreased O2 sats.  No new edema.    Echo demonstrated a low normal EF with aortic valve calcification but no stenosis.     Past Medical History:  Diagnosis Date   Arthritis    Barrett esophagus    Bronchitis    CAD (coronary artery disease)    pt's wife denies any cardiac history on pt.   Cancer [facility])    thyroid   Carotid artery occlusion    Constipation    COPD (chronic obstructive pulmonary disease) (HCC)    Diabetes mellitus without complication (HCC)    Dyspnea    GERD (gastroesophageal reflux disease)    Headache    migraines    History of thyroid cancer 2008   History of thyroid cancer    Hypertension    OSA (obstructive sleep apnea)    Pneumonia    Restless leg    S/P tendon repair    left thumb   Sleep apnea with use of continuous positive airway pressure (CPAP)    2011 [facility] , A[facility]  77cn central and obstrcutive. 16 cm water , 3 cm EPR.    Thoracic ascending aortic aneurysm (HCC)    4.2 cm ascending TAA 08/18/17 CTA. Annual imaging recommended.   Thyroid disease    Ulcer    Peptic ulcer disease    Past Surgical History:  Procedure Laterality Date   ANTERIOR CERVICAL DECOMP/DISCECTOMY FUSION N/A 09/03/2017   Procedure: ACDF - C3-C4 - C4-C5 - C5-C6;  Surgeon: Kary Kos, MD;  Location: [place];  Service: Neurosurgery;  Laterality: N/A;   CAROTID ENDARTERECTOMY Left January 03, 2006   Dr. Amedeo Plenty   COLONOSCOPY     EYE SURGERY Bilateral    cataract surgery with lens implants   game keepers thumb Right    JOINT REPLACEMENT Left    knee X 2   ROTATOR CUFF REPAIR Right    THYROIDECTOMY  2008     Home Medications:  Prior to Admission medications   Medication Sig Start Date  End Date Taking? Authorizing Provider  acetaminophen (TYLENOL) 325 MG tablet Take 2 tablets (650 mg total) by mouth every 4 (four) hours as needed for mild pain ((score 1 to 3) or temp > 100.5). 09/06/17  Yes Kary Kos, MD  allopurinol (ZYLOPRIM) 300 MG tablet Take 300 mg by mouth daily.  06/02/15  Yes [provider]  baclofen (LIORESAL) 10 MG tablet Take 1-1.5 tablets (10-15 mg total) by mouth 3 (three) times daily. Patient taking differently: Take 10 mg by mouth 3 (three) times daily.  05/20/18  Yes Dohmeier, Asencion Partridge, MD  calcium citrate-vitamin D (CITRACAL+D) 315-200 MG-UNIT per tablet Take 1 tablet by mouth daily. 750 mg    Yes [provider]  carbidopa-levodopa (SINEMET IR) 10-100 MG tablet Take 1 tablet by mouth 2 (two) times daily. Patient taking differently: Take 1 tablet by mouth daily.   10/30/16  Yes Dohmeier, Asencion Partridge, MD  cholecalciferol (VITAMIN D) 1000 units tablet Take 1,000 Units by mouth daily.    Yes [provider]  cloNIDine (CATAPRES) 0.1 MG tablet Take 0.1 mg by mouth 2 (two) times daily. 10/09/16  Yes [provider]  colchicine 0.6 MG tablet Take 0.6 mg by mouth daily.   Yes [provider]  guaifenesin (HUMIBID E) 400 MG TABS tablet Take 400 mg by mouth 2 (two) times daily as needed (congestion).   Yes [provider]  HYDROcodone-acetaminophen (NORCO/VICODIN) 5-325 MG tablet Take 1 tablet by mouth 3 (three) times daily as needed (pain). Patient taking differently: Take 1 tablet by mouth daily as needed for moderate pain (pain).  09/06/17  Yes Kary Kos, MD  ipratropium-albuterol (DUONEB) 0.5-2.5 (3) MG/3ML SOLN Take 3 mLs by nebulization every 6 (six) hours as needed (cough).   Yes [provider]  levothyroxine (SYNTHROID, LEVOTHROID) 200 MCG tablet Take 200 mcg by mouth daily before breakfast.    Yes [provider]  losartan-hydrochlorothiazide (HYZAAR) 100-25 MG per tablet Take 1 tablet by mouth daily. 11/04/12  Yes [provider]  metFORMIN (GLUCOPHAGE) 500 MG tablet Take 1 tablet (500 mg total) by mouth 2 (two) times daily with a meal. 08/28/17  Yes Patrecia Pour, Christean Grief, MD  metoprolol succinate (TOPROL-XL) 50 MG 24 hr tablet Take 50 mg by mouth 2 (two) times daily. Take with or immediately following a meal.   Yes [provider]  Multiple Vitamin (MULTIVITAMIN) tablet Take 1 tablet by mouth daily.   Yes [provider]  nitroGLYCERIN (NITROSTAT) 0.4 MG SL tablet Place 0.4 mg every 5 (five) minutes as needed under the tongue for chest pain.   Yes [provider]  oxymetazoline (AFRIN) 0.05 % nasal spray Place 2 sprays into the nose 2 (two) times daily as needed for congestion.    Yes [provider]  pantoprazole (PROTONIX) 40 MG tablet Take 40 mg by mouth every evening.    Yes [provider]  polyethylene glycol (MIRALAX / GLYCOLAX) packet Take 17 g by mouth daily as needed for moderate constipation. Patient taking differently: Take 17 g by mouth every other day.  09/06/17  Yes Kary Kos, MD  rizatriptan (MAXALT) 10 MG tablet Take 10 mg by mouth as needed for migraine.  08/08/18  Yes [provider]  simvastatin (ZOCOR) 40 MG tablet Take 40 mg by mouth daily at 6 PM.  08/07/17  Yes [provider]  TRAVATAN Z 0.004 % SOLN ophthalmic solution Place 1 drop into both eyes at bedtime.  07/25/13  Yes [provider]  TRELEGY ELLIPTA 100-62.5-25 MCG/INH AEPB Inhale 1 puff into the lungs daily.  05/01/18  Yes [provider]  VENTOLIN HFA 108 (90 Base) MCG/ACT inhaler Inhale 1-2 puffs into the lungs every 4 (four) hours as needed for wheezing or shortness of breath.  07/17/18  Yes [provider]    Inpatient Medications: Scheduled Meds:  allopurinol  300 mg Oral Daily   baclofen  10 mg Oral TID   budesonide (PULMICORT) nebulizer solution  0.25 mg Nebulization BID   carbidopa-levodopa  1 tablet Oral Daily   chlorhexidine  15 mL Mouth Rinse BID   Chlorhexidine Gluconate Cloth  6 each Topical Daily   colchicine  0.6 mg Oral Daily   enoxaparin (LOVENOX) injection  40 mg Subcutaneous Q24H   furosemide  40 mg Intravenous Q12H   guaiFENesin  600 mg Oral BID   ipratropium-albuterol  3 mL Nebulization TID   latanoprost  1 drop Both Eyes QHS   levothyroxine  200 mcg Oral Q0600   mouth rinse  15 mL Mouth Rinse q12n4p   metoprolol succinate  12.5 mg Oral BID   multivitamin with minerals  1 tablet Oral Daily   pantoprazole  40 mg Oral QPM   potassium chloride  40 mEq Oral BID   predniSONE  20 mg Oral Q breakfast   simvastatin  40 mg Oral q1800   Continuous Infusions:  sodium chloride 10 mL/hr at 01/23/19 0808   cefTRIAXone (ROCEPHIN)  IV Stopped (01/23/19 0953)   PRN Meds: sodium chloride,  acetaminophen **OR** acetaminophen, HYDROcodone-acetaminophen, nitroGLYCERIN, ondansetron **OR** ondansetron (ZOFRAN) IV, polyethylene glycol, sodium chloride  Allergies:   No Known Allergies  Social History:   Social History   Socioeconomic History   Marital status: Married    Spouse name: [name]   Number of children: 2   Years of education: COLLEGE   Highest education level: Not on file  Occupational History    Employer: RETIRED  Social Designer, fashion/clothing strain: Not on file   Food insecurity    Worry: Not on file    Inability: Not on file   Transportation needs    Medical: Not on file    Non-medical: Not on file  Tobacco Use   Smoking status: Former Smoker    Years: 40.00    Types: Cigars, Cigarettes    Quit date: 06/19/2005    Years since quitting: 13.6   Smokeless tobacco: Never Used   Tobacco comment: smoked 4-6 cigars daily, only smoked cigarettes socially  Substance and Sexual Activity   Alcohol use: No   Drug use: No   Sexual activity: Not on file  Lifestyle   Physical activity    Days per week: Not on file    Minutes per session: Not on file   Stress: Not on file  Relationships   Social connections    Talks on phone: Not on file    Gets together: Not on file    Attends religious service: Not on file    Active member of club or organization: Not on file    Attends meetings of clubs or organizations: Not on file    Relationship status: Not on file   Intimate partner violence    Fear of current or ex partner: Not on file    Emotionally abused: Not on file    Physically abused: Not on file    Forced sexual activity: Not on file  Other Topics Concern   Not on file  Social  History Narrative   Patient is married [name]) and lives at home with his wife.   Patient has two children.   Patient is retired.   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   Patient is right-handed.   Patient drinks three cups of coffee per  day.    Family History:    Family History  Problem Relation Age of Onset   COPD Mother    Hyperlipidemia Mother    Hypertension Mother    Diabetes Father    Kidney disease Father        ESRD   Hypertension Father    Cancer Father    Hyperlipidemia Father      ROS:  Please see the history of present illness.  As stated in the HPI and negative for all other systems.  Physical Exam/Data:   Vitals:   01/23/19 0753 01/23/19 0800 01/23/19 0926 01/23/19 1159  BP:  135/69 (!) 145/87   Pulse:  79    Resp:  (!) 23    Temp: (!) 97.2 F (36.2 C)   98.9 F (37.2 C)  TempSrc: Oral   Oral  SpO2:  (!) 88%    Weight:      Height:        Intake/Output Summary (Last 24 hours) at 01/23/2019 1346 Last data filed at 01/23/2019 0808 Gross per 24 hour  Intake 151.3 ml  Output 1850 ml  Net -1698.7 ml   Filed Weights   01/21/19 0500 01/22/19 0500 01/23/19 0500  Weight: 90 kg 89.3 kg 86.7 kg   Body mass index is 29.94 kg/m.  GENERAL:  No acute distress HEENT:   Pupils equal round and reactive, fundi not visualized, oral mucosa unremarkable NECK:  No  jugular venous distention, waveform within normal limits, carotid upstroke brisk and symmetric, no bruits, no thyromegaly LYMPHATICS:  No cervical, inguinal adenopathy LUNGS:  Decreased breath sounds with expiratory wheezing BACK:  No CVA tenderness CHEST:   Unremarkable HEART:  PMI not displaced or sustained,S1 and S2 within normal limits, no S3, no S4, no clicks, no rubs, no murmurs ABD:  Flat, positive bowel sounds normal in frequency in pitch, no bruits, no rebound, no guarding, no midline pulsatile mass, no hepatomegaly, no splenomegaly EXT:  2 plus pulses throughout, no  edema, no cyanosis no clubbing SKIN:  No rashes no nodules NEURO:   Cranial nerves II through XII grossly intact, motor grossly intact throughout PSYCH:    Cognitively intact, oriented to person place and time   EKG:  The EKG was personally reviewed and  demonstrates:  NSR, rate 98, incomplete LBBB with LAFB.    Telemetry:  Telemetry was personally reviewed and demonstrates:  NSR  Relevant CV Studies:  ECHO:     1. The left ventricle has low normal systolic function, with an ejection fraction of 50-55%. The cavity size was normal. Left ventricular diastolic Doppler parameters are indeterminate.  2. The right ventricle has normal systolic function. The cavity was normal. There is no increase in right ventricular wall thickness.  3. Left atrial size was moderately dilated.  4. The pericardial effusion is circumferential.  5. Trivial pericardial effusion is present.  6. The aortic valve has an indeterminate number of cusps. Severely thickening of the aortic valve. Severe calcifcation of the aortic valve. No stenosis of the aortic valve. Severe aortic annular calcification noted.  7. The mitral valve is abnormal. Mild thickening of the mitral valve leaflet. Mild calcification of the  mitral valve leaflet. There is mild mitral annular calcification present. No evidence of mitral valve stenosis.  8. The aorta is normal in size and structure.  9. The aortic root is normal in size and structure. 10. The inferior vena cava was normal in size with <50% respiratory variability.  Laboratory Data:  Chemistry Recent Labs  Lab 01/20/19 0223 01/21/19 1204 01/23/19 0210  NA 144 141 143  K 3.8 3.3* 3.1*  CL 93* 89* 92*  CO2 42* 37* 38*  GLUCOSE 107* 165* 103*  BUN 23 28* 31*  CREATININE 0.98 0.80 0.92  CALCIUM 8.3* 8.7* 8.9  GFRNONAA >60 >60 >60  GFRAA >60 >60 >60  ANIONGAP 9 15 13     Recent Labs  Lab 01/17/19 1112 01/23/19 0210  PROT 7.2  --   ALBUMIN 3.7 3.5  AST 28  --   ALT 13  --   ALKPHOS 48  --   BILITOT 0.8  --    Hematology Recent Labs  Lab 01/20/19 0223 01/21/19 1204 01/23/19 0422  WBC 13.8* 12.1* 12.7*  RBC 4.18* 4.67 4.65  HGB 11.8* 13.7 13.3  HCT 39.9 42.9 43.1  MCV 95.5 91.9 92.7  MCH 28.2 29.3 28.6  MCHC  29.6* 31.9 30.9  RDW 14.6 14.3 15.2  PLT 247 267 260   Cardiac EnzymesNo results for input(s): TROPONINI in the last 168 hours. No results for input(s): TROPIPOC in the last 168 hours.  BNPNo results for input(s): BNP, PROBNP in the last 168 hours.  DDimer No results for input(s): DDIMER in the last 168 hours.  Radiology/Studies:  Ct Chest Wo Contrast  Result Date: 01/20/2019 CLINICAL DATA:  78 year old male with unresolved pneumonia. Negative for COVID-19. On 01/17/2019. EXAM: CT CHEST WITHOUT CONTRAST TECHNIQUE: Multidetector CT imaging of the chest was performed following the standard protocol without IV contrast. COMPARISON:  Chest CT 08/18/2017. Portable chest today and earlier. FINDINGS: Cardiovascular: Calcified coronary artery atherosclerosis and/or stents (series 2, image 78). Stable cardiac size at the upper limits of normal. No pericardial effusion. Mild Calcified aortic atherosclerosis. Vascular patency is not evaluated in the absence of IV contrast. Mediastinum/Nodes: Thyroid appears to be surgically absent. Reactive appearing mediastinal lymph nodes measure up to 12 millimeter short axis (all subcentimeter in 2019). Lungs/Pleura: Layering bilateral pleural effusions, moderate on the left and small to moderate on the right. The major airways are patent. Near complete atelectasis or consolidation of the left lower lobe, with some air bronchograms. Chronic elevation of the left hemidiaphragm is similar. Right lower lobe basal segment atelectasis or consolidation, with air bronchograms. Superimposed dependent opacity in both upper lobes more resembles atelectasis, but there is also scattered peribronchial ground-glass opacity in the right upper lobe (series 7, image 47), right middle lobe (image 90) and along the right minor fissure which appears inflammatory. The left upper lobe and lingula appear spared. Upper Abdomen: Negative visible noncontrast liver, spleen, pancreas, adrenal glands and  left kidney. Negative visible bowel. Right upper pole nephrolithiasis or renal vascular calcifications on series 2, image 147. Musculoskeletal: Partially visible cervical ACDF. No acute osseous abnormality identified. IMPRESSION: 1. Up to moderate bilateral layering pleural effusions with bilateral lower lobe confluent atelectasis versus infectious consolidation. 2. Superimposed right upper and middle middle lobe peribronchial ground-glass opacity which is compatible with bronchopneumonia. 3. Reactive appearing mediastinal lymph nodes. 4. Calcified coronary artery atherosclerosis. Mild cardiomegaly, no pericardial effusion. 5. Right nephrolithiasis or renal vascular calcifications. Electronically Signed   By: Genevie Ann M.D.   On:  01/20/2019 15:20   Dg Chest Port 1 View  Result Date: 01/21/2019 CLINICAL DATA:  Hypertension.  Shortness of breath. EXAM: PORTABLE CHEST 1 VIEW COMPARISON:  January 20, 2019 FINDINGS: There is cardiomegaly with moderate-sized bilateral pleural effusions and similar bibasilar airspace opacities. No pneumothorax. There is generalized volume overload. There is no acute osseous abnormality. IMPRESSION: Stable appearance of the chest as detailed above. Electronically Signed   By: Constance Holster M.D.   On: 01/21/2019 14:14   Dg Chest Port 1 View  Result Date: 01/20/2019 CLINICAL DATA:  Follow-up, ventilation EXAM: PORTABLE CHEST 1 VIEW COMPARISON:  01/18/2019 FINDINGS: Unchanged AP portable examination with layering bilateral pleural effusions, interstitial pulmonary opacity throughout with heterogeneous airspace opacity of the right midlung and lung base, and mild cardiomegaly. There is no endotracheal tube or other ventilation apparatus in the chest appreciated per stated indication. Surgical clips about the neck likely related to thyroidectomy. IMPRESSION: Unchanged AP portable examination with layering bilateral pleural effusions, interstitial pulmonary opacity throughout with  heterogeneous airspace opacity of the right midlung and lung base, and mild cardiomegaly. There is no endotracheal tube or other ventilation apparatus appreciated in the chest per stated indication. Electronically Signed   By: Eddie Candle M.D.   On: 01/20/2019 09:14    Assessment and Plan:   ACUTE RESPIRATORY FAILURE:     I suspect that this is primarily a pulmonary issue with some component of HF given the pleural effusions.  He is tolerating diuresis and has net negative 7 liters.  We can continue with this and I will increase his Lasix.  I don't think that he will need further cardiac studies this admission.  We could consider repeat CXR in the days to come and thoracentesis if he has moderate or larger effusions.  However, I suspect that this will be a primary pulmonary process that will take a while to reverse with probably the need for at least temporary home O2.    ACUTE ON CHRONIC DIASTOLIC HF:  As above.    For questions or updates, please contact [place] Please consult [email or web address] for contact info under Cardiology/STEMI.   Signed, Minus Breeding, MD  01/23/2019 1:46 PM

## 2019-01-23 NOTE — Progress Notes (Addendum)
[name]  PROGRESS NOTE    [name]  VOZ:[id number] DOB: 02/10/1941 DOA: 01/17/2019 PCP: [name], MD   Brief Narrative:   Cesar Heft Marshis a 78 y.o.malewith past medical history significant for coronary artery disease, COPD not on home oxygen, diabetes, restless leg syndrome, OSA on CPAP, who presents complaining of worsening cough and shortness of breath for the last 2 days prior to admission. On arrival he was febrile, and CXR shows right sided pneumonia.    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      - ABG shows respiratory acidosis due to hypercapnia and hypoxia.      - BIPAP as needed. Repeat      - ABG shows PH of 7. 34, but persistent elevation of PCO2.     - IV rocephin and zithromax; end 01/23/2019.      - Tapering steroids     - Bld Cx NTD     - White count down to 12, afebrile ON     - CT of the chest without contrast showed bilateral layering pleural effusions and right mid lobe consolidation.      - PCCM consulted; appreciate assistance     - had to go back on BiPap last night d/t difficulty maintaining SpO2 on 15L. He tells me that he uses CPap at night, so we can have him on the equivalent while sleeping; currently on 5-6L [place] during my interveiw.      - he's not moving much more on his O2 use right now; abx tx finishes today; he does look good enough to TTF; will ask cards to see for AoC dHF, if there are no new drips added, will TTF to med-tele     - outside of continued lasix; only thing left to address would be pleural effusion. Will rpt CXR;  speak with IR for tap  Sepsis possibly from proteus UTI and community acquired pneumonia.      - Fever resolved,  blood cultures have been negative.      - Urine cultures show proteus, sensitive to rocephin  Acute on chronic diastolic heart failure:     - Improving     - Continue with IV lasix 40 mg BID.      - Continue with strict intake and output.      - Creatine appears to be at baseline with IV diuresis.      - Replace potassium.      - have consulted cardiology  COPD:      - Resume duonebs. Pt not wheezing at this time.   Gout:      - Continue with colchicine and allopurinol.   Mild anemia of chronic disease:      - Hemoglobin stable between 11 to 12.   Hypertension:      - BP acceptable      - lasix  Hypokalemia:      - replete, monitor  Debility/Generalized weakness     - 2-3 person max assist to transfer from bed to chair     -  lives w/ wife at home; doubtful she has the ability on own to assist him around house     - he does note that he works w/ walker and wheelchair at home  DVT prophylaxis: lovenox Code Status: DNR Family Communication: Spoke with wife   Disposition Plan: TBD  Consultants:   Cardiology  PCCM   Antimicrobials:  . Rocephin   ROS:  Denies CP, palpitations, dizziness, N/V. Remainder 10-pt ROS is negative for all not previously mentioned.  Subjective: "I use it re-lig-ous-ly."  Objective: Vitals:   01/23/19 0753 01/23/19 0800 01/23/19 0926 01/23/19 1159  BP:  135/69 (!) 145/87   Pulse:  79    Resp:  (!) 23    Temp: (!) 97.2 F (36.2 C)   98.9 F (37.2 C)  TempSrc: Oral   Oral  SpO2:  (!) 88%    Weight:      Height:        Intake/Output Summary (Last 24 hours) at 01/23/2019 1346 Last data filed at 01/23/2019 0808 Gross per 24 hour  Intake 151.3 ml  Output 1850 ml  Net -1698.7 ml   Filed Weights   01/21/19 0500 01/22/19 0500 01/23/19 0500  Weight: 90 kg 89.3 kg 86.7 kg    Examination:  General: 78 y.o.  male resting in bed in NAD Eyes: PERRL, normal sclera ENMT: Nares patent w/o discharge, orophaynx clear, dentition normal, ears w/o discharge/lesions/ulcers Cardiovascular: RRR, +S1, S2, no m/g/r, equal pulses throughout Respiratory: CTBAL, no w/r/r, normal WOB GI: BS+, NDNT, no masses noted, no organomegaly noted MSK: No e/c/c Skin: No rashes, bruises, ulcerations noted Neuro: alert to name, follows commands Psyc: Appropriate interaction and affect, calm/cooperative   Data Reviewed: I have personally reviewed following labs and imaging studies.  CBC: Recent Labs  Lab 01/17/19 1112  01/18/19 1922 01/19/19 0319 01/20/19 0223 01/21/19 1204 01/23/19 0422  WBC 19.7*   < > 16.8* 15.3* 13.8* 12.1* 12.7*  NEUTROABS 17.4*  --  13.9*  --   --  10.4* 9.0*  HGB 12.1*   < > 12.8* 12.4* 11.8* 13.7 13.3  HCT 37.6*   < > 41.8 40.2 39.9 42.9 43.1  MCV 91.5   < > 93.7 94.6 95.5 91.9 92.7  PLT 220   < > 226 230 247 267 260   < > = values in this interval not displayed.   Basic Metabolic Panel: Recent Labs  Lab 01/17/19 1112  01/18/19 1922 01/19/19 0319 01/20/19 0223 01/21/19 1204 01/23/19 0210  NA 141   < > 141 141 144 141 143  K 2.9*   < > 3.3* 3.2* 3.8 3.3* 3.1*  CL 99   < > 96* 96* 93* 89* 92*  CO2 29   < > 32 34* 42* 37* 38*  GLUCOSE 100*   < > 109* 107* 107* 165* 103*  BUN 24*   < > 17 21 23  28* 31*  CREATININE 0.98   < > 0.88 0.96 0.98 0.80 0.92  CALCIUM 8.9   < > 8.1* 8.2* 8.3* 8.7* 8.9  MG 2.1  --   --   --   --   --  2.2  PHOS  --   --   --   --   --   --  3.2   < > = values in this interval not displayed.   GFR: Estimated Creatinine Clearance: 70.7 mL/min (by C-G formula based on SCr of 0.92 mg/dL). Liver Function Tests: Recent Labs  Lab  01/17/19 1112 01/23/19 0210  AST 28  --   ALT 13  --   ALKPHOS 48  --   BILITOT 0.8  --   PROT 7.2  --   ALBUMIN 3.7 3.5   No results for input(s): LIPASE, AMYLASE in the last 168 hours. No results for input(s): AMMONIA in  the last 168 hours. Coagulation Profile: Recent Labs  Lab 01/17/19 1112  INR 1.1   Cardiac Enzymes: No results for input(s): CKTOTAL, CKMB, CKMBINDEX, TROPONINI in the last 168 hours. BNP (last 3 results) No results for input(s): PROBNP in the last 8760 hours. HbA1C: No results for input(s): HGBA1C in the last 72 hours. CBG: No results for input(s): GLUCAP in the last 168 hours. Lipid Profile: No results for input(s): CHOL, HDL, LDLCALC, TRIG, CHOLHDL, LDLDIRECT in the last 72 hours. Thyroid Function Tests: No results for input(s): TSH, T4TOTAL, FREET4, T3FREE, THYROIDAB in the last 72 hours. Anemia Panel: No results for input(s): VITAMINB12, FOLATE, FERRITIN, TIBC, IRON, RETICCTPCT in the last 72 hours. Sepsis Labs: Recent Labs  Lab 01/17/19 1112 01/17/19 1320 01/17/19 1955 01/17/19 2246  LATICACIDVEN 3.3* 2.4* 2.0* 1.5    Recent Results (from the past 240 hour(s))  Blood Culture (routine x 2)     Status: None   Collection Time: 01/17/19 11:12 AM   Specimen: BLOOD  Result Value Ref Range Status   Specimen Description   Final    BLOOD RIGHT ARM Performed at [facility] [address], [place], [place] [address]    Special Requests   Final    BOTTLES DRAWN AEROBIC AND ANAEROBIC Blood Culture adequate volume Performed at [facility] [address], [place], [place] [address]    Culture   Final    NO GROWTH 5 DAYS Performed at [facility], [place] [address] [place], [place] [address]    Report Status 01/22/2019 FINAL  Final  Blood Culture (routine x 2)     Status: None   Collection Time: 01/17/19 11:12 AM   Specimen: BLOOD  Result Value Ref Range Status   Specimen Description   Final    BLOOD LEFT ARM Performed at [facility] [address] [place], [place] [address]    Special Requests   Final    BOTTLES DRAWN AEROBIC AND ANAEROBIC Blood Culture adequate volume Performed at [facility] [address] [place], [place] [address]    Culture   Final    NO GROWTH 5 DAYS Performed at [facility], [place] [address] [place], [place] [address]    Report Status 01/22/2019 FINAL  Final  SARS Coronavirus 2 [facility] order, Performed in [facility] hospital lab) Nasopharyngeal Nasopharyngeal Swab     Status: None   Collection Time: 01/17/19 11:12 AM   Specimen: Nasopharyngeal Swab  Result Value Ref Range Status   SARS Coronavirus 2 NEGATIVE NEGATIVE Final    Comment: (NOTE) If result is NEGATIVE SARS-CoV-2 target nucleic acids are NOT DETECTED. The SARS-CoV-2 RNA is generally detectable in upper and lower  respiratory specimens during the acute phase of infection. The lowest  concentration of SARS-CoV-2 viral copies this assay can detect is 250  copies / mL. A negative result does not preclude SARS-CoV-2 infection  and should not be used as the sole basis for treatment or other  patient management decisions.  A negative result may occur with  improper specimen collection / handling, submission of specimen other  than nasopharyngeal swab, presence of viral mutation(s) within the  areas targeted by this assay, and inadequate number of viral copies  (<250 copies / mL). A negative result must be combined with clinical  observations, patient history, and epidemiological information. If result is POSITIVE SARS-CoV-2 target nucleic acids are DETECTED. The SARS-CoV-2 RNA is generally detectable in upper and lower  respiratory specimens dur ing the acute phase of infection.  Positive  results are indicative of active infection with SARS-CoV-2.  Clinical  correlation with patient history and other diagnostic information is  necessary to determine patient infection status.  Positive results do  not rule out bacterial infection or co-infection with other viruses. If result is PRESUMPTIVE POSTIVE SARS-CoV-2 nucleic acids MAY BE PRESENT.   A presumptive  positive result was obtained on the submitted specimen  and confirmed on repeat testing.  While 2019 novel coronavirus  (SARS-CoV-2) nucleic acids may be present in the submitted sample  additional confirmatory testing may be necessary for epidemiological  and / or clinical management purposes  to differentiate between  SARS-CoV-2 and other Sarbecovirus currently known to infect humans.  If clinically indicated additional testing with an alternate test  methodology [phone]) is advised. The SARS-CoV-2 RNA is generally  detectable in upper and lower respiratory sp ecimens during the acute  phase of infection. The expected result is Negative. Fact Sheet for Patients:  StrictlyIdeas.no Fact Sheet for Healthcare Providers: BankingDealers.co.za This test is not yet approved or cleared by the [place] FDA and has been authorized for detection and/or diagnosis of SARS-CoV-2 by FDA under an Emergency Use Authorization (EUA).  This EUA will remain in effect (meaning this test can be used) for the duration of the COVID-19 declaration under Section 564(b)(1) of the Act, 21 U.S.C. section 360bbb-3(b)(1), unless the authorization is terminated or revoked sooner. Performed at [facility], [place] [address], [place], [place] [address]   Urine culture     Status: Abnormal   Collection Time: 01/17/19 12:34 PM   Specimen: In/Out Cath Urine  Result Value Ref Range Status   Specimen Description   Final    IN/OUT CATH URINE Performed at [facility] [address], [place], [place] [address]    Special Requests   Final    NONE Performed at [facility], [place] [address] [place], [place] [address]    Culture (A)  Final    80,000 COLONIES/mL PROTEUS MIRABILIS 80,000 COLONIES/mL DIPHTHEROIDS(CORYNEBACTERIUM SPECIES) Standardized susceptibility testing for this organism is not available.  Performed at [facility], [place] [address], [place], [place] [address]    Report Status 01/21/2019 FINAL  Final   Organism ID, Bacteria PROTEUS MIRABILIS (A)  Final      Susceptibility   Proteus mirabilis - MIC*    AMPICILLIN <=2 SENSITIVE Sensitive     CEFAZOLIN <=4 SENSITIVE Sensitive     CEFTRIAXONE <=1 SENSITIVE Sensitive     CIPROFLOXACIN >=4 RESISTANT Resistant     GENTAMICIN <=1 SENSITIVE Sensitive     IMIPENEM 1 SENSITIVE Sensitive     NITROFURANTOIN >=512 RESISTANT Resistant     TRIMETH/SULFA <=20 SENSITIVE Sensitive     AMPICILLIN/SULBACTAM <=2 SENSITIVE Sensitive     PIP/TAZO <=4 SENSITIVE Sensitive     * 80,000 COLONIES/mL PROTEUS MIRABILIS  MRSA PCR Screening     Status: None   Collection Time: 01/17/19  8:42 PM   Specimen: Nasal Mucosa; Nasopharyngeal  Result Value Ref Range Status   MRSA  by PCR NEGATIVE NEGATIVE Final    Comment:        The GeneXpert MRSA Assay (FDA approved for NASAL specimens only), is one component of a comprehensive MRSA colonization surveillance program. It is not intended to diagnose MRSA infection nor to guide or monitor treatment for MRSA infections. Performed at [facility], [place] [address], [place], [place] [address]       Radiology Studies: Dg Chest Port 1 View  Result Date: 01/21/2019 CLINICAL DATA:  Hypertension.  Shortness of breath. EXAM: PORTABLE CHEST 1 VIEW COMPARISON:  January 20, 2019 FINDINGS: There is cardiomegaly with moderate-sized bilateral pleural effusions and similar bibasilar airspace opacities. No pneumothorax. There is generalized volume overload. There is no acute osseous abnormality. IMPRESSION: Stable appearance of the chest as detailed above. Electronically Signed   By: Constance Holster M.D.   On: 01/21/2019 14:14     Scheduled Meds: . allopurinol  300 mg Oral Daily  . baclofen  10 mg Oral TID  . budesonide (PULMICORT) nebulizer solution  0.25 mg Nebulization BID  .  carbidopa-levodopa  1 tablet Oral Daily  . chlorhexidine  15 mL Mouth Rinse BID  . Chlorhexidine Gluconate Cloth  6 each Topical Daily  . colchicine  0.6 mg Oral Daily  . enoxaparin (LOVENOX) injection  40 mg Subcutaneous Q24H  . furosemide  40 mg Intravenous Q12H  . guaiFENesin  600 mg Oral BID  . ipratropium-albuterol  3 mL Nebulization TID  . latanoprost  1 drop Both Eyes QHS  . levothyroxine  200 mcg Oral Q0600  . mouth rinse  15 mL Mouth Rinse q12n4p  . metoprolol succinate  12.5 mg Oral BID  . multivitamin with minerals  1 tablet Oral Daily  . pantoprazole  40 mg Oral QPM  . potassium chloride  40 mEq Oral BID  . predniSONE  20 mg Oral Q breakfast  . simvastatin  40 mg Oral q1800   Continuous Infusions: . sodium chloride 10 mL/hr at 01/23/19 0808  . cefTRIAXone (ROCEPHIN)  IV Stopped (01/23/19 0953)     LOS: 6 days    Time spent: 35 minutes spent in the coordination of care today.   Jonnie Finner, DO [facility] Pager [phone]  If 7PM-7AM, please contact night-coverage [email or web address] Password [email or web address] 01/23/2019, 1:46 PM

## 2019-01-24 ENCOUNTER — Inpatient Hospital Stay (HOSPITAL_COMMUNITY): Payer: Medicare Other

## 2019-01-24 DIAGNOSIS — G2581 Restless legs syndrome: Secondary | ICD-10-CM

## 2019-01-24 DIAGNOSIS — I5031 Acute diastolic (congestive) heart failure: Secondary | ICD-10-CM

## 2019-01-24 DIAGNOSIS — M4712 Other spondylosis with myelopathy, cervical region: Secondary | ICD-10-CM

## 2019-01-24 LAB — CBC WITH DIFFERENTIAL/PLATELET
Abs Immature Granulocytes: 0.23 10*3/uL — ABNORMAL HIGH (ref 0.00–0.07)
Basophils Absolute: 0.1 10*3/uL (ref 0.0–0.1)
Basophils Relative: 1 %
Eosinophils Absolute: 0.3 10*3/uL (ref 0.0–0.5)
Eosinophils Relative: 2 %
HCT: 42.1 % (ref 39.0–52.0)
Hemoglobin: 12.7 g/dL — ABNORMAL LOW (ref 13.0–17.0)
Immature Granulocytes: 2 %
Lymphocytes Relative: 14 %
Lymphs Abs: 1.8 10*3/uL (ref 0.7–4.0)
MCH: 28.2 pg (ref 26.0–34.0)
MCHC: 30.2 g/dL (ref 30.0–36.0)
MCV: 93.3 fL (ref 80.0–100.0)
Monocytes Absolute: 1 10*3/uL (ref 0.1–1.0)
Monocytes Relative: 8 %
Neutro Abs: 9.3 10*3/uL — ABNORMAL HIGH (ref 1.7–7.7)
Neutrophils Relative %: 73 %
Platelets: 271 10*3/uL (ref 150–400)
RBC: 4.51 MIL/uL (ref 4.22–5.81)
RDW: 15.2 % (ref 11.5–15.5)
WBC: 12.6 10*3/uL — ABNORMAL HIGH (ref 4.0–10.5)
nRBC: 0 % (ref 0.0–0.2)

## 2019-01-24 LAB — COMPREHENSIVE METABOLIC PANEL
ALT: 21 U/L (ref 0–44)
AST: 18 U/L (ref 15–41)
Albumin: 3.6 g/dL (ref 3.5–5.0)
Alkaline Phosphatase: 48 U/L (ref 38–126)
Anion gap: 13 (ref 5–15)
BUN: 33 mg/dL — ABNORMAL HIGH (ref 8–23)
CO2: 34 mmol/L — ABNORMAL HIGH (ref 22–32)
Calcium: 9 mg/dL (ref 8.9–10.3)
Chloride: 97 mmol/L — ABNORMAL LOW (ref 98–111)
Creatinine, Ser: 0.89 mg/dL (ref 0.61–1.24)
GFR calc Af Amer: >60 mL/min (ref >60)
GFR calc non Af Amer: >60 mL/min (ref >60)
Glucose, Bld: 107 mg/dL — ABNORMAL HIGH (ref 70–99)
Potassium: 3.4 mmol/L — ABNORMAL LOW (ref 3.5–5.1)
Sodium: 144 mmol/L (ref 135–145)
Total Bilirubin: 0.2 mg/dL — ABNORMAL LOW (ref 0.3–1.2)
Total Protein: 6.9 g/dL (ref 6.5–8.1)

## 2019-01-24 LAB — MAGNESIUM: Magnesium: 2.4 mg/dL (ref 1.7–2.4)

## 2019-01-24 LAB — CREATININE, SERUM
Creatinine, Ser: 1.01 mg/dL (ref 0.61–1.24)
GFR calc Af Amer: >60 mL/min (ref >60)
GFR calc non Af Amer: >60 mL/min (ref >60)

## 2019-01-24 LAB — SARS CORONAVIRUS 2 BY RT PCR (HOSPITAL ORDER, PERFORMED IN ~~LOC~~ HOSPITAL LAB): SARS Coronavirus 2: NEGATIVE

## 2019-01-24 LAB — LACTATE DEHYDROGENASE: LDH: 135 U/L (ref 98–192)

## 2019-01-24 IMAGING — US CHEST ULTRASOUND
1 series · 2 of 2 positions shown · non-contrast
Comparison: None.

CLINICAL DATA: 77-year-old male referred for possible thoracentesis

EXAM:
CHEST ULTRASOUND

[Series 1: chest ultrasound · 2 of 2 slices shown]
[im 1/2]
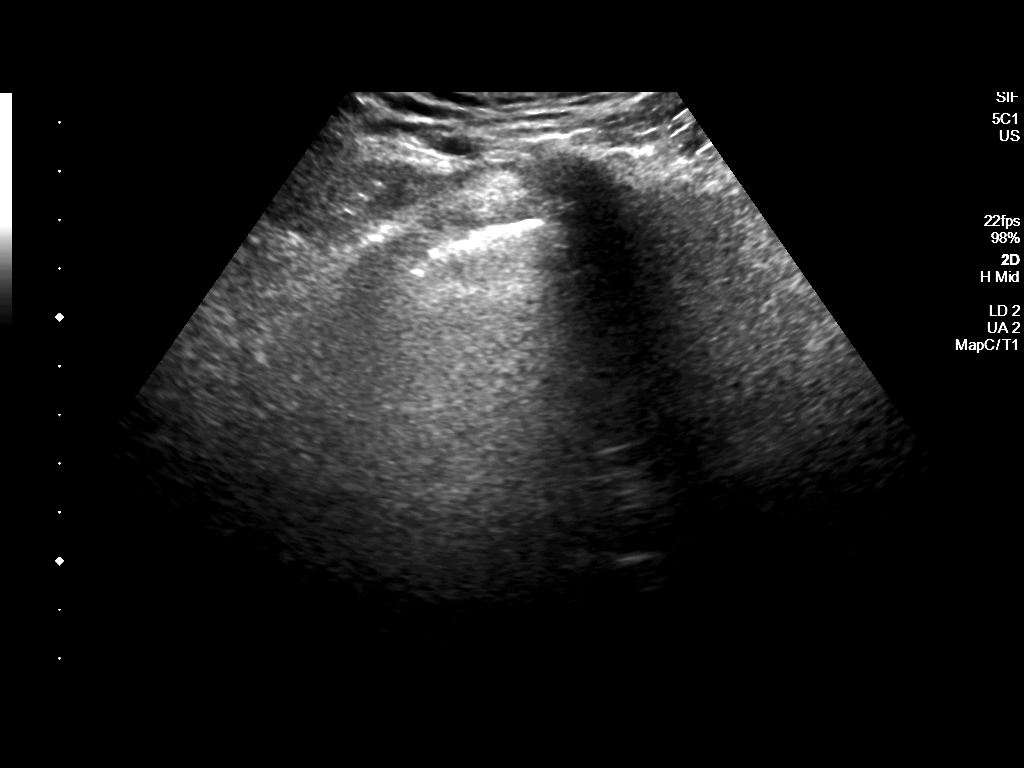
[im 2/2]
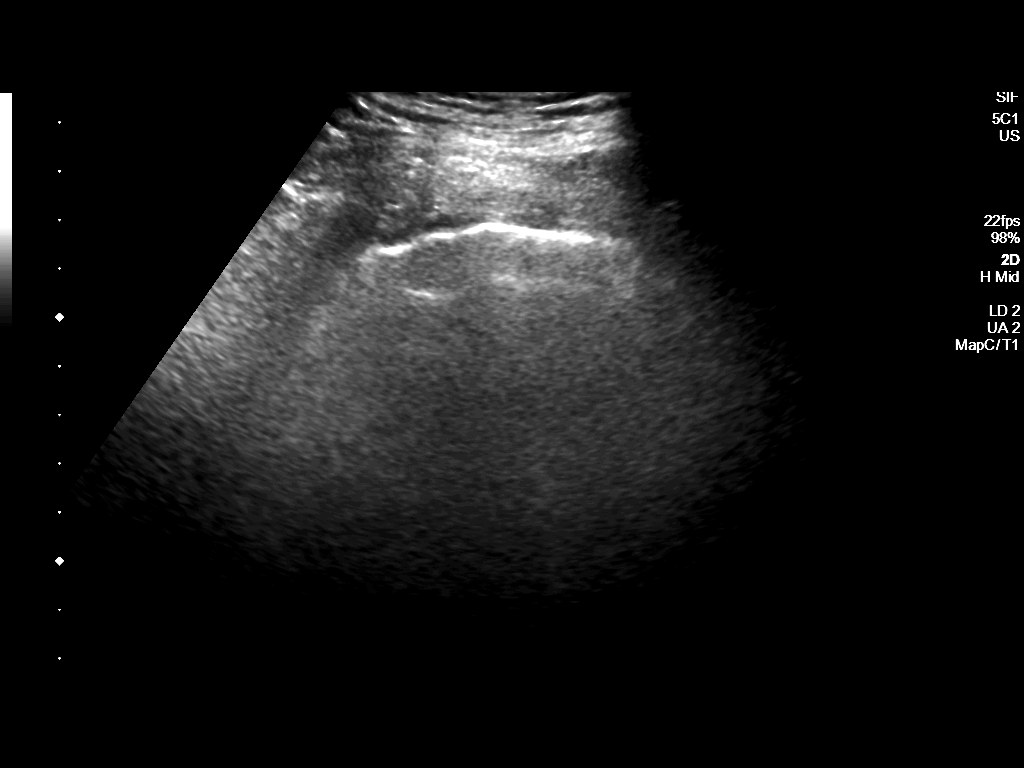

[2 of 2 positions shown; findings below may reference images not displayed]

FINDINGS: Ultrasound of the chest reveals scant pleural fluid bilaterally.
Thoracentesis not performed.
IMPRESSION: Scant pleural fluid bilaterally.

## 2019-01-24 MED ORDER — LIDOCAINE HCL 1 % IJ SOLN
INTRAMUSCULAR | Status: AC
  Filled 2019-01-24: qty 10

## 2019-01-24 NOTE — Progress Notes (Signed)
Physical Therapy Treatment Patient Details Name: [name] MRN: [id number] DOB: 1940-06-27 Today's Date: 01/24/2019    History of Present Illness 78 y.o. male with past medical history significant for coronary artery disease, COPD not on home oxygen, diabetes, restless leg syndrome, OSA on CPAP, ACDF C3-6, and admitted for Acute respiratory failure with  hypoxia and hypercapnia secondary to CAP and acute on chronic diastolic heart failure    PT Comments    Pt cooperative and progressing slowly but steadily with mobility.   Follow Up Recommendations  Home health PT;SNF     Equipment Recommendations  None recommended by PT    Recommendations for Other Services       Precautions / Restrictions Precautions Precautions: Fall Precaution Comments: monitor sats Restrictions Weight Bearing Restrictions: No    Mobility  Bed Mobility Overal bed mobility: Needs Assistance Bed Mobility: Supine to Sit     Supine to sit: +2 for physical assistance;Min assist;HOB elevated     General bed mobility comments: Increased time with assist to bring trunk to sitting   Transfers Overall transfer level: Needs assistance Equipment used: Rolling walker (2 wheeled) Transfers: Sit to/from Stand Sit to Stand: +2 physical assistance;+2 safety/equipment;Mod assist         General transfer comment: cues for use of UEs to self assist.  Physical assist to bring wt up and fwd and to balance in standing  Ambulation/Gait Ambulation/Gait assistance: Min assist;Mod assist;+2 safety/equipment Gait Distance (Feet): 8 Feet(and additional 5') Assistive device: Rolling walker (2 wheeled) Gait Pattern/deviations: Step-to pattern;Decreased step length - right;Decreased step length - left;Shuffle;Trunk flexed Gait velocity: decr   General Gait Details: cues for posture and position from RW.  Physical assist for support (knees buckling) and balance   Stairs             Wheelchair Mobility     Modified Rankin (Stroke Patients Only)       Balance Overall balance assessment: Needs assistance Sitting-balance support: Feet supported;No upper extremity supported Sitting balance-Leahy Scale: Fair     Standing balance support: Bilateral upper extremity supported Standing balance-Leahy Scale: Poor                              Cognition Arousal/Alertness: Awake/alert Behavior During Therapy: WFL for tasks assessed/performed Overall Cognitive Status: Within Functional Limits for tasks assessed                                        Exercises      General Comments        Pertinent Vitals/Pain Pain Assessment: No/denies pain    Home Living                      Prior Function            PT Goals (current goals can now be found in the care plan section) Acute Rehab PT Goals Patient Stated Goal: Walk further PT Goal Formulation: With patient Time For Goal Achievement: 02/03/19 Potential to Achieve Goals: Fair Progress towards PT goals: Progressing toward goals    Frequency    Min 3X/week      PT Plan Discharge plan needs to be updated    Co-evaluation              AM-PAC PT "6 Clicks" Mobility  Outcome Measure  Help needed turning from your back to your side while in a flat bed without using bedrails?: A Little Help needed moving from lying on your back to sitting on the side of a flat bed without using bedrails?: A Lot Help needed moving to and from a bed to a chair (including a wheelchair)?: A Lot Help needed standing up from a chair using your arms (e.g., wheelchair or bedside chair)?: A Lot Help needed to walk in hospital room?: A Lot Help needed climbing 3-5 steps with a railing? : A Lot 6 Click Score: 13    End of Session Equipment Utilized During Treatment: Gait belt;Oxygen Activity Tolerance: Patient tolerated treatment well;Patient limited by fatigue Patient left: in chair;with call bell/phone  within reach;with chair alarm set Nurse Communication: Mobility status PT Visit Diagnosis: Other abnormalities of gait and mobility (R26.89);Difficulty in walking, not elsewhere classified (R26.2)     Time: 1607-3710 PT Time Calculation (min) (ACUTE ONLY): 34 min  Charges:  $Gait Training: 8-22 mins $Therapeutic Activity: 8-22 mins                     [place] Pager [phone] Office [phone]    Deshae Dickison 01/24/2019, 4:13 PM

## 2019-01-24 NOTE — Progress Notes (Signed)
[name]  PROGRESS NOTE    [name]  DXA:[id number] DOB: 06-12-1941 DOA: 01/17/2019 PCP: Cesar Battles, MD   Brief Narrative:   [name] a 78 y.o.malewith past medical history significant for coronary artery disease, COPD not on home oxygen, diabetes, restless leg syndrome, OSA on CPAP, who presents complaining of worsening cough and shortness of breath for the last 2 days prior to admission. On arrival he was febrile, and CXR shows right sided pneumonia.   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  - ABG shows respiratory acidosis due to hypercapnia and hypoxia.  - BIPAP as needed. Repeat  - ABG shows PH of 7. 34, but persistent elevation of PCO2. - IV rocephin and zithromax; end 01/23/2019.  - Tapering steroids - Bld Cx NTD - White count down to 12, afebrile ON - CT of the chest without contrast showed bilateral layering pleural effusions and right mid lobe consolidation.  - PCCM consulted; appreciate assistance - had to go back on BiPap last night d/t difficulty maintaining SpO2 on 15L. He tells me that he uses CPap at night, so we can have him on the equivalent while sleeping; currently on 5-6L [place] during my interveiw.     - he's not moving much more on his O2 use right now; abx tx finishes today; he does look good enough to TTF; will ask cards to see for AoC dHF, if there are no new drips added, will TTF to med-tele     - outside of continued lasix; only thing left to address would be pleural effusion. Will rpt CXR;  speak with IR for tap     - taken to IR for thoracentesis, unable to safely perform per IR ; see note for details; ok to TTF-telemetry     - lasix 60mg  IV BID  Sepsis possibly from proteus UTI and community acquired pneumonia.  - Fever resolved, blood cultures have been negative.  - Urine cultures show proteus, sensitive to rocephin  Acute on chronic diastolic heart failure: - Improving - Continue with IV lasix 40 mg BID.  - Continue with strict intake and output.  - Creatine appears to be at baseline with IV diuresis.  - Replace potassium.      - have consulted cardiology  COPD:  - Resume duonebs. Pt not wheezing at this time.   Gout:  - Continue with colchicine and allopurinol.   Mild anemia of chronic disease:  - Hemoglobin stable between 11 to 12.   Hypertension:  - BP acceptable  - lasix  Hypokalemia:  - replete, monitor  Debility/Generalized weakness - 2-3 person max assist to transfer from bed to chair - lives w/ wife at home; doubtful she has the ability on own to assist him around house - he does note that he works w/ walker and wheelchair at home  Weaning O2 as tolerated. Unable to do thoracentesis. See IR note for details.   DVT prophylaxis: lovenox Code Status: DNR Family Communication: Spoke with wife   Disposition Plan: TBD  Consultants:  Cardiology  PCCM   Antimicrobials:   Rocephin   ROS:  Denies CP, palpitations, dizziness, N/V. Remainder 10-pt ROS is negative for all not previously mentioned.  Subjective: "I guess I can do that."  Objective: Vitals:   01/24/19 1300 01/24/19 1334 01/24/19 1356 01/24/19 1400  BP:  109/70  (!) 142/79  Pulse: 69 74 76 85  Resp: 17 (!) 22 (!) 22 16  Temp:      TempSrc:      SpO2: 93% 97% 94% 93%  Weight:      Height:        Intake/Output Summary (Last 24 hours) at 01/24/2019 1508 Last data filed at 01/24/2019 0200 Gross  per 24 hour  Intake 186.63 ml  Output 2425 ml  Net -2238.37 ml   Filed Weights   01/22/19 0500 01/23/19 0500 01/24/19 0252  Weight: 89.3 kg 86.7 kg 87.3 kg    Examination:  General:77 y.o.maleresting in bed in NAD Eyes: PERRL, normal sclera ENMT: Nares patent w/o discharge, orophaynx clear, dentition normal, ears w/o discharge/lesions/ulcers Cardiovascular: RRR, +S1, S2, no m/g/r, equal pulses throughout Respiratory:CTBAL, no w/r/r, normal WOB GI: BS+, NDNT, no masses noted, no organomegaly noted MSK: No e/c/c Skin: No rashes, bruises, ulcerations noted Neuro:alert to name, follows commands Psyc: Appropriate interaction and affect, calm/cooperative   Data Reviewed: I have personally reviewed following labs and imaging studies.  CBC: Recent Labs  Lab 01/18/19 1922 01/19/19 0319 01/20/19 0223 01/21/19 1204 01/23/19 0422 01/24/19 0808  WBC 16.8* 15.3* 13.8* 12.1* 12.7* 12.6*  NEUTROABS 13.9*  --   --  10.4* 9.0* 9.3*  HGB 12.8* 12.4* 11.8* 13.7 13.3 12.7*  HCT 41.8 40.2 39.9 42.9 43.1 42.1  MCV 93.7 94.6 95.5 91.9 92.7 93.3  PLT 226 230 247 267 260 329   Basic Metabolic Panel: Recent Labs  Lab 01/19/19 0319 01/20/19 0223 01/21/19 1204 01/23/19 0210 01/24/19 0504 01/24/19 0808  NA 141 144 141 143  --  144  K 3.2* 3.8 3.3* 3.1*  --  3.4*  CL 96* 93* 89* 92*  --  97*  CO2 34* 42* 37* 38*  --  34*  GLUCOSE 107* 107* 165* 103*  --  107*  BUN 21 23 28* 31*  --  33*  CREATININE 0.96 0.98 0.80 0.92 1.01 0.89  CALCIUM 8.2* 8.3* 8.7* 8.9  --  9.0  MG  --   --   --  2.2  --  2.4  PHOS  --   --   --  3.2  --   --    GFR: Estimated Creatinine Clearance: 73.3 mL/min (by C-G formula based on SCr of 0.89 mg/dL). Liver Function Tests: Recent Labs  Lab 01/23/19 0210 01/24/19 0808  AST  --  18  ALT  --  21  ALKPHOS  --  48  BILITOT  --  0.2*  PROT  --  6.9  ALBUMIN 3.5 3.6   No results for input(s): LIPASE, AMYLASE in the last 168 hours. No results for  input(s): AMMONIA in the last 168 hours. Coagulation Profile: No results for input(s): INR, PROTIME in the last 168 hours. Cardiac Enzymes: No results for input(s): CKTOTAL, CKMB, CKMBINDEX, TROPONINI in the last 168 hours. BNP (last 3 results) No results for input(s): PROBNP in the last 8760 hours. HbA1C: No results for input(s): HGBA1C in the last 72 hours. CBG: No results for input(s): GLUCAP in the last 168 hours. Lipid Profile: No results for input(s): CHOL, HDL, LDLCALC, TRIG,  CHOLHDL, LDLDIRECT in the last 72 hours. Thyroid Function Tests: No results for input(s): TSH, T4TOTAL, FREET4, T3FREE, THYROIDAB in the last 72 hours. Anemia Panel: No results for input(s): VITAMINB12, FOLATE, FERRITIN, TIBC, IRON, RETICCTPCT in the last 72 hours. Sepsis Labs: Recent Labs  Lab 01/17/19 1955 01/17/19 2246  LATICACIDVEN 2.0* 1.5    Recent Results (from the past 240 hour(s))  Blood Culture (routine x 2)     Status: None   Collection Time: 01/17/19 11:12 AM   Specimen: BLOOD  Result Value Ref Range Status   Specimen Description   Final    BLOOD RIGHT ARM Performed at [facility] [address], [place], [place] [address]    Special Requests   Final    BOTTLES DRAWN AEROBIC AND ANAEROBIC Blood Culture adequate volume Performed at [facility] [address] [place], [place] [address]    Culture   Final    NO GROWTH 5 DAYS Performed at [facility], [place] [address], [place], [place] [address]    Report Status 01/22/2019 FINAL  Final  Blood Culture (routine x 2)     Status: None   Collection Time: 01/17/19 11:12 AM   Specimen: BLOOD  Result Value Ref Range Status   Specimen Description   Final    BLOOD LEFT ARM Performed at [facility] [address], [place], [place] [address]    Special Requests   Final    BOTTLES DRAWN AEROBIC AND ANAEROBIC Blood Culture adequate volume Performed at [facility] [address] [place], [place] [address]    Culture   Final    NO GROWTH 5 DAYS Performed at [facility], [place] [address], [place],  [address]    Report Status 01/22/2019 FINAL  Final  SARS Coronavirus 2 [facility] order, Performed in [facility] hospital lab) Nasopharyngeal Nasopharyngeal Swab     Status: None   Collection Time: 01/17/19 11:12 AM   Specimen: Nasopharyngeal Swab  Result Value Ref Range Status   SARS Coronavirus 2 NEGATIVE NEGATIVE Final    Comment: (NOTE) If result is NEGATIVE SARS-CoV-2 target nucleic acids are NOT DETECTED. The SARS-CoV-2 RNA is generally detectable in upper and lower  respiratory specimens during the acute phase of infection. The lowest  concentration of SARS-CoV-2 viral copies this assay can detect is 250  copies / mL. A negative result does not preclude SARS-CoV-2 infection  and should not be used as the sole basis for treatment or other  patient management decisions.  A negative result may occur with  improper specimen collection / handling, submission of specimen other  than nasopharyngeal swab, presence of viral mutation(s) within the  areas targeted by this assay, and inadequate number of viral copies  (<250 copies / mL). A negative result must be combined with clinical  observations, patient history, and epidemiological information. If result is POSITIVE SARS-CoV-2 target nucleic acids are DETECTED. The SARS-CoV-2 RNA is generally detectable in upper and lower  respiratory specimens dur ing the acute phase of infection.  Positive  results are indicative of active infection with SARS-CoV-2.  Clinical  correlation with patient history and other diagnostic information is  necessary to determine patient infection status.  Positive results do  not rule out bacterial infection or co-infection with other viruses. If result is PRESUMPTIVE POSTIVE SARS-CoV-2 nucleic acids MAY BE PRESENT.   A presumptive positive result  was obtained on the submitted specimen  and  confirmed on repeat testing.  While 2019 novel coronavirus  (SARS-CoV-2) nucleic acids may be present in the submitted sample  additional confirmatory testing may be necessary for epidemiological  and / or clinical management purposes  to differentiate between  SARS-CoV-2 and other Sarbecovirus currently known to infect humans.  If clinically indicated additional testing with an alternate test  methodology [phone]) is advised. The SARS-CoV-2 RNA is generally  detectable in upper and lower respiratory sp ecimens during the acute  phase of infection. The expected result is Negative. Fact Sheet for Patients:  StrictlyIdeas.no Fact Sheet for Healthcare Providers: BankingDealers.co.za This test is not yet approved or cleared by the [place] FDA and has been authorized for detection and/or diagnosis of SARS-CoV-2 by FDA under an Emergency Use Authorization (EUA).  This EUA will remain in effect (meaning this test can be used) for the duration of the COVID-19 declaration under Section 564(b)(1) of the Act, 21 U.S.C. section 360bbb-3(b)(1), unless the authorization is terminated or revoked sooner. Performed at [facility], [place] [address], [place], [place] [address]   Urine culture     Status: Abnormal   Collection Time: 01/17/19 12:34 PM   Specimen: In/Out Cath Urine  Result Value Ref Range Status   Specimen Description   Final    IN/OUT CATH URINE Performed at [facility] [address] [place], [place] [address]    Special Requests   Final    NONE Performed at [facility], [place] [address] [place], [place] [address]    Culture (A)  Final    80,000 COLONIES/mL PROTEUS MIRABILIS 80,000 COLONIES/mL DIPHTHEROIDS(CORYNEBACTERIUM SPECIES) Standardized susceptibility testing for this organism is not available. Performed at [facility], [place] [address], [place], [place] [address]    Report Status 01/21/2019 FINAL  Final   Organism ID, Bacteria PROTEUS MIRABILIS (A)  Final      Susceptibility   Proteus mirabilis - MIC*    AMPICILLIN <=2 SENSITIVE Sensitive     CEFAZOLIN <=4 SENSITIVE Sensitive     CEFTRIAXONE <=1 SENSITIVE Sensitive     CIPROFLOXACIN >=4 RESISTANT Resistant     GENTAMICIN <=1 SENSITIVE Sensitive     IMIPENEM 1 SENSITIVE Sensitive     NITROFURANTOIN >=512 RESISTANT Resistant     TRIMETH/SULFA <=20 SENSITIVE Sensitive     AMPICILLIN/SULBACTAM <=2 SENSITIVE Sensitive     PIP/TAZO <=4 SENSITIVE Sensitive     * 80,000 COLONIES/mL PROTEUS MIRABILIS  MRSA PCR Screening     Status: None   Collection Time: 01/17/19  8:42 PM   Specimen: Nasal Mucosa; Nasopharyngeal  Result Value Ref Range Status   MRSA by PCR NEGATIVE NEGATIVE Final    Comment:        The GeneXpert MRSA Assay (FDA approved for NASAL specimens only), is one component of a comprehensive MRSA colonization surveillance program. It is not intended to diagnose MRSA infection nor to guide or monitor treatment for MRSA infections. Performed at [facility], [place] [address], [place], [place] [address]   SARS Coronavirus 2 [facility] order, Performed in [facility] hospital lab)     Status: None   Collection Time: 01/24/19  8:19 AM  Result Value Ref Range Status   SARS Coronavirus 2 NEGATIVE NEGATIVE Final    Comment: (NOTE) If result is NEGATIVE SARS-CoV-2 target nucleic acids are NOT DETECTED. The SARS-CoV-2 RNA is generally detectable in upper and lower  respiratory specimens during the acute phase of infection.  The lowest  concentration of SARS-CoV-2 viral copies this assay can detect is 250  copies / mL. A negative result does not preclude SARS-CoV-2 infection  and should not be used as the sole basis for treatment or other  patient management decisions.  A negative result may occur with  improper specimen  collection / handling, submission of specimen other  than nasopharyngeal swab, presence of viral mutation(s) within the  areas targeted by this assay, and inadequate number of viral copies  (<250 copies / mL). A negative result must be combined with clinical  observations, patient history, and epidemiological information. If result is POSITIVE SARS-CoV-2 target nucleic acids are DETECTED. The SARS-CoV-2 RNA is generally detectable in upper and lower  respiratory specimens dur ing the acute phase of infection.  Positive  results are indicative of active infection with SARS-CoV-2.  Clinical  correlation with patient history and other diagnostic information is  necessary to determine patient infection status.  Positive results do  not rule out bacterial infection or co-infection with other viruses. If result is PRESUMPTIVE POSTIVE SARS-CoV-2 nucleic acids MAY BE PRESENT.   A presumptive positive result was obtained on the submitted specimen  and confirmed on repeat testing.  While 2019 novel coronavirus  (SARS-CoV-2) nucleic acids may be present in the submitted sample  additional confirmatory testing may be necessary for epidemiological  and / or clinical management purposes  to differentiate between  SARS-CoV-2 and other Sarbecovirus currently known to infect humans.  If clinically indicated additional testing with an alternate test  methodology [phone]) is advised. The SARS-CoV-2 RNA is generally  detectable in upper and lower respiratory sp ecimens during the acute  phase of infection. The expected result is Negative. Fact Sheet for Patients:  StrictlyIdeas.no Fact Sheet for Healthcare Providers: BankingDealers.co.za This test is not yet approved or cleared by the [place] FDA and has been authorized for detection and/or diagnosis of SARS-CoV-2 by FDA under an Emergency Use Authorization (EUA).  This EUA will remain in effect  (meaning this test can be used) for the duration of the COVID-19 declaration under Section 564(b)(1) of the Act, 21 U.S.C. section 360bbb-3(b)(1), unless the authorization is terminated or revoked sooner. Performed at [facility], [place] [address] [place], [place] [address]       Radiology Studies: [place] Chest (pleural Effusion)  Result Date: 01/24/2019 CLINICAL DATA:  77 year old male referred for possible thoracentesis EXAM: CHEST ULTRASOUND COMPARISON:  None. FINDINGS: Ultrasound of the chest reveals scant pleural fluid bilaterally. Thoracentesis not performed. IMPRESSION: Scant pleural fluid bilaterally. Electronically Signed   By: Corrie Mckusick D.O.   On: 01/24/2019 14:10   Dg Chest Port 1 View  Result Date: 01/23/2019 CLINICAL DATA:  Worsening cough and shortness of breath EXAM: PORTABLE CHEST 1 VIEW COMPARISON:  01/21/2019, CT 01/20/2019 FINDINGS: Moderate bilateral pleural effusions, similar compared to prior radiograph. Continued bibasilar consolidations. Enlarged cardiomediastinal silhouette with central vascular congestion. No pneumothorax. IMPRESSION: No significant change in moderate bilateral pleural effusions and bibasilar airspace disease. Cardiomegaly. Electronically Signed   By: Donavan Foil M.D.   On: 01/23/2019 15:38     Scheduled Meds: . allopurinol  300 mg Oral Daily  . baclofen  10 mg Oral TID  . budesonide (PULMICORT) nebulizer solution  0.25 mg Nebulization BID  . carbidopa-levodopa  1 tablet Oral Daily  . chlorhexidine  15 mL Mouth Rinse BID  . Chlorhexidine Gluconate Cloth  6 each Topical Daily  . colchicine  0.6 mg Oral Daily  .  enoxaparin (LOVENOX) injection  40 mg Subcutaneous Q24H  . furosemide  60 mg Intravenous Q12H  . guaiFENesin  600 mg Oral BID  . ipratropium-albuterol  3 mL Nebulization TID  . latanoprost  1 drop Both Eyes QHS  . levothyroxine  200 mcg Oral Q0600  . lidocaine      . mouth rinse  15 mL Mouth Rinse q12n4p  .  metoprolol succinate  12.5 mg Oral BID  . multivitamin with minerals  1 tablet Oral Daily  . pantoprazole  40 mg Oral QPM  . potassium chloride  40 mEq Oral BID  . predniSONE  20 mg Oral Q breakfast  . simvastatin  40 mg Oral q1800   Continuous Infusions: . sodium chloride Stopped (01/23/19 1719)  . cefTRIAXone (ROCEPHIN)  IV Stopped (01/24/19 1000)     LOS: 7 days    Time spent: 25 minutes spent in the coordination of care today.   Jonnie Finner, DO [facility] Pager [phone]  If 7PM-7AM, please contact night-coverage [email or web address] Password [email or web address] 01/24/2019, 3:08 PM

## 2019-01-24 NOTE — Progress Notes (Signed)
Patient ID: [name], male   DOB: March 02, 1941, 78 y.o.   MRN: [id number] Pt presented to [place] dept today for thoracentesis. On limited US of both right and left chest regions there are only trace pleural effusions noted, not enough to safely aspirate due to close proximity to lungs. Procedure cancelled. Pt informed. Images were also reviewed by Dr. Earleen Newport.

## 2019-01-24 NOTE — Progress Notes (Signed)
Agree with previous RN's assessment of patient.  Patient placed on telemetry and verified with CMT.  Patient oriented to unit and equipment.  Resting in bed comfortably at this time.  Will continue to monitor.

## 2019-01-24 NOTE — Progress Notes (Addendum)
Progress Note  Patient Name: [name] Date of Encounter: 01/24/2019  Primary Cardiologist: Minus Breeding, MD   Subjective   Still on high supplemental O2 requirement but RN reports they have been able to wean down from 5>>4 L. Feels a bit better.   Inpatient Medications    Scheduled Meds: . allopurinol  300 mg Oral Daily  . baclofen  10 mg Oral TID  . budesonide (PULMICORT) nebulizer solution  0.25 mg Nebulization BID  . carbidopa-levodopa  1 tablet Oral Daily  . chlorhexidine  15 mL Mouth Rinse BID  . Chlorhexidine Gluconate Cloth  6 each Topical Daily  . colchicine  0.6 mg Oral Daily  . enoxaparin (LOVENOX) injection  40 mg Subcutaneous Q24H  . furosemide  60 mg Intravenous Q12H  . guaiFENesin  600 mg Oral BID  . ipratropium-albuterol  3 mL Nebulization TID  . latanoprost  1 drop Both Eyes QHS  . levothyroxine  200 mcg Oral Q0600  . mouth rinse  15 mL Mouth Rinse q12n4p  . metoprolol succinate  12.5 mg Oral BID  . multivitamin with minerals  1 tablet Oral Daily  . pantoprazole  40 mg Oral QPM  . potassium chloride  40 mEq Oral BID  . predniSONE  20 mg Oral Q breakfast  . simvastatin  40 mg Oral q1800   Continuous Infusions: . sodium chloride Stopped (01/23/19 1719)  . cefTRIAXone (ROCEPHIN)  IV Stopped (01/24/19 1000)   PRN Meds: sodium chloride, acetaminophen **OR** acetaminophen, HYDROcodone-acetaminophen, nitroGLYCERIN, ondansetron **OR** ondansetron (ZOFRAN) IV, polyethylene glycol, sodium chloride   Vital Signs    Vitals:   01/24/19 0800 01/24/19 0846 01/24/19 0900 01/24/19 1000  BP: 138/82   (!) 167/71  Pulse: 70  73 80  Resp: 12  17 20   Temp:      TempSrc:      SpO2: 95% 98% 95% 93%  Weight:      Height:        Intake/Output Summary (Last 24 hours) at 01/24/2019 1045 Last data filed at 01/24/2019 0200 Gross per 24 hour  Intake 186.63 ml  Output 2425 ml  Net -2238.37 ml   Last 3 Weights 01/24/2019 01/23/2019 01/22/2019  Weight (lbs) 192 lb 7.4 oz  191 lb 2.2 oz 196 lb 13.9 oz  Weight (kg) 87.3 kg 86.7 kg 89.3 kg      Telemetry    NSR 73 bpm  - Personally Reviewed  ECG    Not performed today  - Personally Reviewed  Physical Exam   GEN: No acute distress.   Neck: No JVD Cardiac: RRR, no murmurs, rubs, or gallops.  Respiratory: Clear to auscultation bilaterally. GI: Soft, nontender, non-distended  MS: No edema; No deformity. Neuro:  Nonfocal  Psych: Normal affect   Labs    High Sensitivity Troponin:  No results for input(s): TROPONINIHS in the last 720 hours.    Cardiac EnzymesNo results for input(s): TROPONINI in the last 168 hours. No results for input(s): TROPIPOC in the last 168 hours.   Chemistry Recent Labs  Lab 01/17/19 1112  01/21/19 1204 01/23/19 0210 01/24/19 0504 01/24/19 0808  NA 141   < > 141 143  --  144  K 2.9*   < > 3.3* 3.1*  --  3.4*  CL 99   < > 89* 92*  --  97*  CO2 29   < > 37* 38*  --  34*  GLUCOSE 100*   < > 165* 103*  --  107*  BUN 24*   < > 28* 31*  --  33*  CREATININE 0.98   < > 0.80 0.92 1.01 0.89  CALCIUM 8.9   < > 8.7* 8.9  --  9.0  PROT 7.2  --   --   --   --  6.9  ALBUMIN 3.7  --   --  3.5  --  3.6  AST 28  --   --   --   --  18  ALT 13  --   --   --   --  21  ALKPHOS 48  --   --   --   --  48  BILITOT 0.8  --   --   --   --  0.2*  GFRNONAA >60   < > >60 >60 >60 >60  GFRAA >60   < > >60 >60 >60 >60  ANIONGAP 13   < > 15 13  --  13   < > = values in this interval not displayed.     Hematology Recent Labs  Lab 01/21/19 1204 01/23/19 0422 01/24/19 0808  WBC 12.1* 12.7* 12.6*  RBC 4.67 4.65 4.51  HGB 13.7 13.3 12.7*  HCT 42.9 43.1 42.1  MCV 91.9 92.7 93.3  MCH 29.3 28.6 28.2  MCHC 31.9 30.9 30.2  RDW 14.3 15.2 15.2  PLT 267 260 271    BNPNo results for input(s): BNP, PROBNP in the last 168 hours.   DDimer No results for input(s): DDIMER in the last 168 hours.   Radiology    Dg Chest Port 1 View  Result Date: 01/23/2019 CLINICAL DATA:  Worsening cough and  shortness of breath EXAM: PORTABLE CHEST 1 VIEW COMPARISON:  01/21/2019, CT 01/20/2019 FINDINGS: Moderate bilateral pleural effusions, similar compared to prior radiograph. Continued bibasilar consolidations. Enlarged cardiomediastinal silhouette with central vascular congestion. No pneumothorax. IMPRESSION: No significant change in moderate bilateral pleural effusions and bibasilar airspace disease. Cardiomegaly. Electronically Signed   By: Donavan Foil M.D.   On: 01/23/2019 15:38    Cardiac Studies   ECHO 01/19/19:    1. The left ventricle has low normal systolic function, with an ejection fraction of 50-55%. The cavity size was normal. Left ventricular diastolic Doppler parameters are indeterminate. 2. The right ventricle has normal systolic function. The cavity was normal. There is no increase in right ventricular wall thickness. 3. Left atrial size was moderately dilated. 4. The pericardial effusion is circumferential. 5. Trivial pericardial effusion is present. 6. The aortic valve has an indeterminate number of cusps. Severely thickening of the aortic valve. Severe calcifcation of the aortic valve. No stenosis of the aortic valve. Severe aortic annular calcification noted. 7. The mitral valve is abnormal. Mild thickening of the mitral valve leaflet. Mild calcification of the mitral valve leaflet. There is mild mitral annular calcification present. No evidence of mitral valve stenosis. 8. The aorta is normal in size and structure. 9. The aortic root is normal in size and structure. 10. The inferior vena cava was normal in size with <50% respiratory variability.  Patient Profile     [name] is a 78 y.o. male with a hx of COPD who is being seen today for the evaluation of hypoxemia at the request of Dr. Marylyn Ishihara.  Assessment & Plan    1. Acute Hypoxic Respiratory Failure: felt to be primarily a pulmonary issues w/ some component of CHF. Will continue IV lasix for HF. Consider f/u  CXR in a few  days to check for interval change (pleural effusions noted on prior CXR). If no improvement w/ diuresis, can consider thoracentesis.  2. Diastolic CHF: echo shows normal LVEF. Treating w/ IV Lasix. Good UOP. 2.4L out yesterday. Net I/Os -9L total since admit. Scr improved, down from 1.01>>0.89. K 3.4. still with high supplemental O2 requirements, but as noted above, suspect primarily a pulmonary cause.   3. Hypokalemia: 3.4 today in the setting of IV diuretics. Supplemental Kdur ordered. F/u BMP in the AM.    For questions or updates, please contact [place] Please consult [email or web address] for contact info under        Signed, Lyda Jester, PA-C  01/24/2019, 10:45 AM    History and all data above reviewed.  Patient examined.  I agree with the findings as above.  Breathing better.  No acute distress    He is down about 10 liters this admission.   The patient exam reveals COR:RRR  ,  Lungs: Decreased breath sounds  ,  Abd: Positive bowel sounds, no rebound no guarding, Ext Trace edema  .  All available labs, radiology testing, previous records reviewed. Agree with documented assessment and plan. Acute diastolic HF:  Continue IV diuresis and thoracentesis if repeat imaging demonstrates significant fluid.  No further cardiac work up.    [name]  11:01 AM  01/24/2019

## 2019-01-25 LAB — CBC WITH DIFFERENTIAL/PLATELET
Abs Immature Granulocytes: 0.23 10*3/uL — ABNORMAL HIGH (ref 0.00–0.07)
Basophils Absolute: 0.1 10*3/uL (ref 0.0–0.1)
Basophils Relative: 0 %
Eosinophils Absolute: 0.3 10*3/uL (ref 0.0–0.5)
Eosinophils Relative: 2 %
HCT: 41 % (ref 39.0–52.0)
Hemoglobin: 13 g/dL (ref 13.0–17.0)
Immature Granulocytes: 2 %
Lymphocytes Relative: 19 %
Lymphs Abs: 2.8 10*3/uL (ref 0.7–4.0)
MCH: 29.2 pg (ref 26.0–34.0)
MCHC: 31.7 g/dL (ref 30.0–36.0)
MCV: 92.1 fL (ref 80.0–100.0)
Monocytes Absolute: 0.9 10*3/uL (ref 0.1–1.0)
Monocytes Relative: 6 %
Neutro Abs: 10.7 10*3/uL — ABNORMAL HIGH (ref 1.7–7.7)
Neutrophils Relative %: 71 %
Platelets: 276 10*3/uL (ref 150–400)
RBC: 4.45 MIL/uL (ref 4.22–5.81)
RDW: 15.3 % (ref 11.5–15.5)
WBC: 15 10*3/uL — ABNORMAL HIGH (ref 4.0–10.5)
nRBC: 0 % (ref 0.0–0.2)

## 2019-01-25 LAB — RENAL FUNCTION PANEL
Albumin: 3.7 g/dL (ref 3.5–5.0)
Anion gap: 11 (ref 5–15)
BUN: 34 mg/dL — ABNORMAL HIGH (ref 8–23)
CO2: 33 mmol/L — ABNORMAL HIGH (ref 22–32)
Calcium: 9.1 mg/dL (ref 8.9–10.3)
Chloride: 97 mmol/L — ABNORMAL LOW (ref 98–111)
Creatinine, Ser: 0.8 mg/dL (ref 0.61–1.24)
GFR calc Af Amer: >60 mL/min (ref >60)
GFR calc non Af Amer: >60 mL/min (ref >60)
Glucose, Bld: 107 mg/dL — ABNORMAL HIGH (ref 70–99)
Phosphorus: 4.5 mg/dL (ref 2.5–4.6)
Potassium: 3.5 mmol/L (ref 3.5–5.1)
Sodium: 141 mmol/L (ref 135–145)

## 2019-01-25 LAB — MAGNESIUM: Magnesium: 2.4 mg/dL (ref 1.7–2.4)

## 2019-01-25 MED ORDER — PREDNISONE 10 MG PO TABS
10.0000 mg | ORAL_TABLET | Freq: Every day | ORAL | Status: AC
  Administered 2019-01-25 – 2019-01-26 (×2): 10 mg via ORAL
  Filled 2019-01-25 (×2): qty 1

## 2019-01-25 MED ORDER — FUROSEMIDE 10 MG/ML IJ SOLN
80.0000 mg | Freq: Two times a day (BID) | INTRAMUSCULAR | Status: DC
  Administered 2019-01-25 – 2019-01-27 (×5): 80 mg via INTRAVENOUS
  Filled 2019-01-25 (×5): qty 8

## 2019-01-25 NOTE — Progress Notes (Signed)
Physical Therapy Treatment Patient Details Name: [name] MRN: [id number] DOB: 11-24-1940 Today's Date: 01/25/2019    History of Present Illness 78 y.o. male with past medical history significant for coronary artery disease, COPD not on home oxygen, diabetes, restless leg syndrome, OSA on CPAP, ACDF C3-6, and admitted for Acute respiratory failure with  hypoxia and hypercapnia secondary to CAP and acute on chronic diastolic heart failure    PT Comments    Pt fatigued in chair and assisted to stand and ambulate short distance back to bed.  Assist of 2 and multiple attempts to stand and for transfer back to bed.   Follow Up Recommendations  Home health PT;SNF     Equipment Recommendations  None recommended by PT    Recommendations for Other Services       Precautions / Restrictions Precautions Precautions: Fall Precaution Comments: monitor sats Restrictions Weight Bearing Restrictions: No    Mobility  Bed Mobility Overal bed mobility: Needs Assistance Bed Mobility: Sit to Supine     Supine to sit: Min assist;Mod assist Sit to supine: Mod assist   General bed mobility comments: Increased time with assist to bring LEs up into bed  Transfers Overall transfer level: Needs assistance Equipment used: Rolling walker (2 wheeled) Transfers: Sit to/from Stand Sit to Stand: +2 physical assistance;+2 safety/equipment;Mod assist         General transfer comment: cues for use of UEs to self assist.  Physical assist to bring wt up and fwd and to balance in standing  Ambulation/Gait Ambulation/Gait assistance: Min assist;Mod assist;+2 safety/equipment Gait Distance (Feet): 5 Feet Assistive device: Rolling walker (2 wheeled) Gait Pattern/deviations: Step-to pattern;Decreased step length - right;Decreased step length - left;Shuffle;Trunk flexed Gait velocity: decr   General Gait Details: cues for posture and position from RW.  Physical assist for support (knees buckling) and  balance   Stairs             Wheelchair Mobility    Modified Rankin (Stroke Patients Only)       Balance Overall balance assessment: Needs assistance Sitting-balance support: Feet supported;No upper extremity supported Sitting balance-Leahy Scale: Fair     Standing balance support: Bilateral upper extremity supported Standing balance-Leahy Scale: Poor                              Cognition Arousal/Alertness: Awake/alert Behavior During Therapy: WFL for tasks assessed/performed Overall Cognitive Status: Within Functional Limits for tasks assessed                                        Exercises      General Comments        Pertinent Vitals/Pain Pain Assessment: 0-10 Pain Score: 3  Pain Location: My butt is sore from sitting Pain Descriptors / Indicators: Sore Pain Intervention(s): Limited activity within patient's tolerance;Monitored during session;Repositioned    Home Living                      Prior Function            PT Goals (current goals can now be found in the care plan section) Acute Rehab PT Goals Patient Stated Goal: Walk further PT Goal Formulation: With patient Time For Goal Achievement: 02/03/19 Potential to Achieve Goals: Fair Progress towards PT goals: Progressing toward goals  Frequency    Min 3X/week      PT Plan Current plan remains appropriate    Co-evaluation              AM-PAC PT "6 Clicks" Mobility   Outcome Measure  Help needed turning from your back to your side while in a flat bed without using bedrails?: A Little Help needed moving from lying on your back to sitting on the side of a flat bed without using bedrails?: A Lot Help needed moving to and from a bed to a chair (including a wheelchair)?: A Lot Help needed standing up from a chair using your arms (e.g., wheelchair or bedside chair)?: A Lot Help needed to walk in hospital room?: A Lot Help needed climbing  3-5 steps with a railing? : A Lot 6 Click Score: 13    End of Session Equipment Utilized During Treatment: Gait belt;Oxygen Activity Tolerance: Patient limited by fatigue Patient left: in bed;with call bell/phone within reach;with bed alarm set Nurse Communication: Mobility status PT Visit Diagnosis: Other abnormalities of gait and mobility (R26.89);Difficulty in walking, not elsewhere classified (R26.2)     Time: 0254-2706 PT Time Calculation (min) (ACUTE ONLY): 16 min  Charges:  $Gait Training: 23-37 mins $Therapeutic Activity: 8-22 mins                     Debe Coder PT Acute Rehabilitation Services Pager [phone] Office [phone]    Clyde Zarrella 01/25/2019, 3:25 PM

## 2019-01-25 NOTE — Progress Notes (Signed)
[name]  PROGRESS NOTE    [name]  POE:[id number] DOB: [birth year] DOA: 01/17/2019 PCP: [name], MD   Brief Narrative:   [name] a 78 y.o.malewith past medical history significant for coronary artery disease, COPD not on home oxygen, diabetes, restless leg syndrome, OSA on CPAP, who presents complaining of worsening cough and shortness of breath for the last 2 days prior to admission. On arrival he was febrile, and CXR shows right sided pneumonia.   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  - ABG shows respiratory acidosis due to hypercapnia and hypoxia.  - BIPAP as needed. Repeat  - ABG shows PH of 7.34, but persistent elevation of PCO2. - IV rocephin and zithromax; end zithro 01/23/2019.  - Tapering steroids - Bld Cx NTD - White count down to 12, afebrile ON - CT of the chest without contrast showed bilateral layering pleural effusions and right mid lobe consolidation.  - PCCM consulted; appreciate assistance - had to go back on BiPap last night d/t difficulty maintaining SpO2 on 15L. He tells me that he uses CPap at night, so we can have him on the equivalent while sleeping; currently on 5-6L [place] during my interveiw. - he's not moving much more on his O2 use right now; abx tx finishes today; he does look good enough to TTF; will ask cards to see for AoC dHF, if there are no new drips added, will TTF to med-tele - outside of continued lasix; only thing left to address would be pleural effusion. Will  rpt CXR; speak with IR for tap     - taken to IR for thoracentesis, unable to safely perform per IR ; see note for details; ok to TTF-telemetry     - now on lasix 80mg  IV BID  Sepsis possibly from proteus UTI and community acquired pneumonia.  - Fever resolved, blood cultures have been negative.  - Urine cultures show proteus, sensitive to rocephin     - stop rocephin  CAP     - see above  UTI     - see above  Acute on chronic diastolic heart failure: - Improving - Continue with IV lasix 40 mg BID.  - Continue with strict intake and output.  - Creatine appears to be at baseline with IV diuresis.  - Replace potassium. - have consulted cardiology     - now on IV lasix 80mg  BID  COPD:  - Resume duonebs. Pt not wheezing at this time.     - pulmicort, duobnebs, mucinex   Gout:  - Continue with colchicine and allopurinol.   Mild anemia of chronic disease:  - no evidence of bleed, monitor  Hypertension:  - BP acceptable  - lasix  Hypokalemia:  - replete, monitor  Debility/Generalized weakness - 2-3 person max assist to transfer from bed to chair - lives w/ wife at home; doubtful she has the ability on own to assist him around house - he does note that he works w/ walker and wheelchair at home  Charting 1L to [employer]  today; but looks like he's on 2-3 during interview. Unable to tap anything in thoracentesis yesterday. Add IS today. Continue breathing Tx. Steroid taper. Continue working with PT. Stop rocephin.  DVT prophylaxis:lovenox Code Status:DNR Disposition Plan:TBD  Consultants:  Cardiology  PCCM   Antimicrobials:  . Rocephin   ROS:  Says dyspnea is improving. Denies CP, palpitations. Denies N/V. Remainder 10-pt ROS is negative for all not previously mentioned.  Subjective: "I don't have any choice."  Objective: Vitals:   01/24/19 1719 01/24/19 2111 01/24/19 2128 01/25/19  0517  BP: 116/79 (!) 165/77  126/71  Pulse: 78 78  68  Resp: 18 16  14   Temp: 98.7 F (37.1 C) 98.3 F (36.8 C)  98.3 F (36.8 C)  TempSrc: Oral Oral  Oral  SpO2: 98% 95% 95% 97%  Weight: 86.3 kg     Height: 5\' 7"  (1.702 m)       Intake/Output Summary (Last 24 hours) at 01/25/2019 0729 Last data filed at 01/25/2019 0600 Gross per 24 hour  Intake 457.04 ml  Output 1800 ml  Net -1342.96 ml   Filed Weights   01/23/19 0500 01/24/19 0252 01/24/19 1719  Weight: 86.7 kg 87.3 kg 86.3 kg    Examination:  General:77 y.o.maleresting in bed in NAD Eyes: PERRL, normal sclera ENMT: Nares patent w/o discharge, orophaynx clear, dentition normal, ears w/o discharge/lesions/ulcers Cardiovascular: RRR, +S1, S2, no m/g/r, equal pulses throughout Respiratory:CTBAL, no w/r/r, normal WOB GI: BS+, NDNT, no masses noted, no organomegaly noted MSK: No e/c/c Skin: No rashes, bruises, ulcerations noted Neuro:alert to name, follows commands Psyc: Appropriate interaction and affect, calm/cooperative   Data Reviewed: I have personally reviewed following labs and imaging studies.  CBC: Recent Labs  Lab 01/18/19 1922  01/20/19 0223 01/21/19 1204 01/23/19 0422 01/24/19 0808 01/25/19 0407  WBC 16.8*   < > 13.8* 12.1* 12.7* 12.6* 15.0*  NEUTROABS 13.9*  --   --  10.4* 9.0* 9.3* 10.7*  HGB 12.8*   < > 11.8* 13.7 13.3 12.7* 13.0  HCT 41.8   < > 39.9 42.9 43.1 42.1 41.0  MCV 93.7   < > 95.5 91.9 92.7 93.3 92.1  PLT 226   < > 247 267 260 271 276   < > = values in this interval not displayed.   Basic Metabolic Panel: Recent Labs  Lab 01/20/19 0223 01/21/19 1204 01/23/19 0210 01/24/19 0504 01/24/19 0808 01/25/19 0407  NA 144 141 143  --  144 141  K 3.8 3.3* 3.1*  --  3.4* 3.5  CL 93* 89* 92*  --  97* 97*  CO2 42* 37* 38*  --  34* 33*  GLUCOSE 107* 165* 103*  --  107* 107*  BUN 23 28* 31*  --  33* 34*  CREATININE 0.98 0.80 0.92 1.01 0.89 0.80  CALCIUM 8.3* 8.7* 8.9  --  9.0 9.1  MG   --   --  2.2  --  2.4 2.4  PHOS  --   --  3.2  --   --  4.5   GFR: Estimated Creatinine Clearance: 81.2 mL/min (by C-G formula based on SCr of 0.8 mg/dL). Liver Function Tests: Recent Labs  Lab 01/23/19 0210 01/24/19 0808 01/25/19 0407  AST  --  18  --   ALT  --  21  --   ALKPHOS  --  48  --   BILITOT  --  0.2*  --   PROT  --  6.9  --  ALBUMIN 3.5 3.6 3.7   No results for input(s): LIPASE, AMYLASE in the last 168 hours. No results for input(s): AMMONIA in the last 168 hours. Coagulation Profile: No results for input(s): INR, PROTIME in the last 168 hours. Cardiac Enzymes: No results for input(s): CKTOTAL, CKMB, CKMBINDEX, TROPONINI in the last 168 hours. BNP (last 3 results) No results for input(s): PROBNP in the last 8760 hours. HbA1C: No results for input(s): HGBA1C in the last 72 hours. CBG: No results for input(s): GLUCAP in the last 168 hours. Lipid Profile: No results for input(s): CHOL, HDL, LDLCALC, TRIG, CHOLHDL, LDLDIRECT in the last 72 hours. Thyroid Function Tests: No results for input(s): TSH, T4TOTAL, FREET4, T3FREE, THYROIDAB in the last 72 hours. Anemia Panel: No results for input(s): VITAMINB12, FOLATE, FERRITIN, TIBC, IRON, RETICCTPCT in the last 72 hours. Sepsis Labs: No results for input(s): PROCALCITON, LATICACIDVEN in the last 168 hours.  Recent Results (from the past 240 hour(s))  Blood Culture (routine x 2)     Status: None   Collection Time: 01/17/19 11:12 AM   Specimen: BLOOD  Result Value Ref Range Status   Specimen Description   Final    BLOOD RIGHT ARM Performed at [facility] [address], [place], [place] [address]    Special Requests   Final    BOTTLES DRAWN AEROBIC AND ANAEROBIC Blood Culture adequate volume Performed at [facility] [address] [place], [place] [address]    Culture   Final    NO GROWTH 5 DAYS Performed at [facility], [place] [address], [place], [place]  [address]    Report Status 01/22/2019 FINAL  Final  Blood Culture (routine x 2)     Status: None   Collection Time: 01/17/19 11:12 AM   Specimen: BLOOD  Result Value Ref Range Status   Specimen Description   Final    BLOOD LEFT ARM Performed at [facility] [address], [place], [place] [address]    Special Requests   Final    BOTTLES DRAWN AEROBIC AND ANAEROBIC Blood Culture adequate volume Performed at [facility] [address], [place], [place] [address]    Culture   Final    NO GROWTH 5 DAYS Performed at [facility], [place] [address] [place], [place] [address]    Report Status 01/22/2019 FINAL  Final  SARS Coronavirus 2 [facility] order, Performed in [facility] hospital lab) Nasopharyngeal Nasopharyngeal Swab     Status: None   Collection Time: 01/17/19 11:12 AM   Specimen: Nasopharyngeal Swab  Result Value Ref Range Status   SARS Coronavirus 2 NEGATIVE NEGATIVE Final    Comment: (NOTE) If result is NEGATIVE SARS-CoV-2 target nucleic acids are NOT DETECTED. The SARS-CoV-2 RNA is generally detectable in upper and lower  respiratory specimens during the acute phase of infection. The lowest  concentration of SARS-CoV-2 viral copies this assay can detect is 250  copies / mL. A negative result does not preclude SARS-CoV-2 infection  and should not be used as the sole basis for treatment or other  patient management decisions.  A negative result may occur with  improper specimen collection / handling, submission of specimen other  than nasopharyngeal swab, presence of viral mutation(s) within the  areas targeted by this assay, and inadequate number of viral copies  (<250 copies / mL). A negative result must be combined with clinical  observations, patient history, and epidemiological information. If result is  POSITIVE SARS-CoV-2 target nucleic acids are DETECTED. The SARS-CoV-2 RNA is generally detectable in upper and lower   respiratory specimens dur ing the acute phase of infection.  Positive  results are indicative of active infection with SARS-CoV-2.  Clinical  correlation with patient history and other diagnostic information is  necessary to determine patient infection status.  Positive results do  not rule out bacterial infection or co-infection with other viruses. If result is PRESUMPTIVE POSTIVE SARS-CoV-2 nucleic acids MAY BE PRESENT.   A presumptive positive result was obtained on the submitted specimen  and confirmed on repeat testing.  While 2019 novel coronavirus  (SARS-CoV-2) nucleic acids may be present in the submitted sample  additional confirmatory testing may be necessary for epidemiological  and / or clinical management purposes  to differentiate between  SARS-CoV-2 and other Sarbecovirus currently known to infect humans.  If clinically indicated additional testing with an alternate test  methodology [phone]) is advised. The SARS-CoV-2 RNA is generally  detectable in upper and lower respiratory sp ecimens during the acute  phase of infection. The expected result is Negative. Fact Sheet for Patients:  StrictlyIdeas.no Fact Sheet for Healthcare Providers: BankingDealers.co.za This test is not yet approved or cleared by the [place] FDA and has been authorized for detection and/or diagnosis of SARS-CoV-2 by FDA under an Emergency Use Authorization (EUA).  This EUA will remain in effect (meaning this test can be used) for the duration of the COVID-19 declaration under Section 564(b)(1) of the Act, 21 U.S.C. section 360bbb-3(b)(1), unless the authorization is terminated or revoked sooner. Performed at [facility], [place] [address], [place], [place] [address]   Urine culture     Status: Abnormal   Collection Time: 01/17/19 12:34 PM   Specimen: In/Out Cath Urine  Result Value Ref Range Status   Specimen Description    Final    IN/OUT CATH URINE Performed at [facility] [address], [place], [place] [address]    Special Requests   Final    NONE Performed at [facility], [place] [address] [place], [place] [address]    Culture (A)  Final    80,000 COLONIES/mL PROTEUS MIRABILIS 80,000 COLONIES/mL DIPHTHEROIDS(CORYNEBACTERIUM SPECIES) Standardized susceptibility testing for this organism is not available. Performed at [facility], [place] [address], [place], [place] [address]    Report Status 01/21/2019 FINAL  Final   Organism ID, Bacteria PROTEUS MIRABILIS (A)  Final      Susceptibility   Proteus mirabilis - MIC*    AMPICILLIN <=2 SENSITIVE Sensitive     CEFAZOLIN <=4 SENSITIVE Sensitive     CEFTRIAXONE <=1 SENSITIVE Sensitive     CIPROFLOXACIN >=4 RESISTANT Resistant     GENTAMICIN <=1 SENSITIVE Sensitive     IMIPENEM 1 SENSITIVE Sensitive     NITROFURANTOIN >=512 RESISTANT Resistant     TRIMETH/SULFA <=20 SENSITIVE Sensitive     AMPICILLIN/SULBACTAM <=2 SENSITIVE Sensitive     PIP/TAZO <=4 SENSITIVE Sensitive     * 80,000 COLONIES/mL PROTEUS MIRABILIS  MRSA PCR Screening     Status: None   Collection Time: 01/17/19  8:42 PM   Specimen: Nasal Mucosa; Nasopharyngeal  Result Value Ref Range Status   MRSA by PCR NEGATIVE NEGATIVE Final    Comment:        The GeneXpert MRSA Assay (FDA approved for NASAL specimens only), is one component of a comprehensive MRSA colonization surveillance program. It is not intended to diagnose MRSA infection  nor to guide or monitor treatment for MRSA infections. Performed at [facility], [place] [address] [place], [place] [address]   SARS Coronavirus 2 [facility] order, Performed in [facility] hospital lab)     Status: None   Collection Time: 01/24/19  8:19 AM  Result Value Ref Range Status   SARS Coronavirus 2 NEGATIVE NEGATIVE Final    Comment: (NOTE) If result is NEGATIVE SARS-CoV-2  target nucleic acids are NOT DETECTED. The SARS-CoV-2 RNA is generally detectable in upper and lower  respiratory specimens during the acute phase of infection. The lowest  concentration of SARS-CoV-2 viral copies this assay can detect is 250  copies / mL. A negative result does not preclude SARS-CoV-2 infection  and should not be used as the sole basis for treatment or other  patient management decisions.  A negative result may occur with  improper specimen collection / handling, submission of specimen other  than nasopharyngeal swab, presence of viral mutation(s) within the  areas targeted by this assay, and inadequate number of viral copies  (<250 copies / mL). A negative result must be combined with clinical  observations, patient history, and epidemiological information. If result is POSITIVE SARS-CoV-2 target nucleic acids are DETECTED. The SARS-CoV-2 RNA is generally detectable in upper and lower  respiratory specimens dur ing the acute phase of infection.  Positive  results are indicative of active infection with SARS-CoV-2.  Clinical  correlation with patient history and other diagnostic information is  necessary to determine patient infection status.  Positive results do  not rule out bacterial infection or co-infection with other viruses. If result is PRESUMPTIVE POSTIVE SARS-CoV-2 nucleic acids MAY BE PRESENT.   A presumptive positive result was obtained on the submitted specimen  and confirmed on repeat testing.  While 2019 novel coronavirus  (SARS-CoV-2) nucleic acids may be present in the submitted sample  additional confirmatory testing may be necessary for epidemiological  and / or clinical management purposes  to differentiate between  SARS-CoV-2 and other Sarbecovirus currently known to infect humans.  If clinically indicated additional testing with an alternate test  methodology [phone]) is advised. The SARS-CoV-2 RNA is generally  detectable in upper and lower  respiratory sp ecimens during the acute  phase of infection. The expected result is Negative. Fact Sheet for Patients:  StrictlyIdeas.no Fact Sheet for Healthcare Providers: BankingDealers.co.za This test is not yet approved or cleared by the [place] FDA and has been authorized for detection and/or diagnosis of SARS-CoV-2 by FDA under an Emergency Use Authorization (EUA).  This EUA will remain in effect (meaning this test can be used) for the duration of the COVID-19 declaration under Section 564(b)(1) of the Act, 21 U.S.C. section 360bbb-3(b)(1), unless the authorization is terminated or revoked sooner. Performed at [facility], [place] [address] [place], [place] [address]       Radiology Studies: [place] Chest (pleural Effusion)  Result Date: 01/24/2019 CLINICAL DATA:  78 year old male referred for possible thoracentesis EXAM: CHEST ULTRASOUND COMPARISON:  None. FINDINGS: Ultrasound of the chest reveals scant pleural fluid bilaterally. Thoracentesis not performed. IMPRESSION: Scant pleural fluid bilaterally. Electronically Signed   By: Corrie Mckusick D.O.   On: 01/24/2019 14:10   Dg Chest Port 1 View  Result Date: 01/23/2019 CLINICAL DATA:  Worsening cough and shortness of breath EXAM: PORTABLE CHEST 1 VIEW COMPARISON:  01/21/2019, CT 01/20/2019 FINDINGS: Moderate bilateral pleural effusions, similar compared to prior radiograph. Continued bibasilar consolidations. Enlarged cardiomediastinal silhouette with central vascular  congestion. No pneumothorax. IMPRESSION: No significant change in moderate bilateral pleural effusions and bibasilar airspace disease. Cardiomegaly. Electronically Signed   By: Donavan Foil M.D.   On: 01/23/2019 15:38     Scheduled Meds: . allopurinol  300 mg Oral Daily  . baclofen  10 mg Oral TID  . budesonide (PULMICORT) nebulizer solution  0.25 mg Nebulization BID  . carbidopa-levodopa  1 tablet  Oral Daily  . chlorhexidine  15 mL Mouth Rinse BID  . Chlorhexidine Gluconate Cloth  6 each Topical Daily  . colchicine  0.6 mg Oral Daily  . enoxaparin (LOVENOX) injection  40 mg Subcutaneous Q24H  . furosemide  60 mg Intravenous Q12H  . guaiFENesin  600 mg Oral BID  . ipratropium-albuterol  3 mL Nebulization TID  . latanoprost  1 drop Both Eyes QHS  . levothyroxine  200 mcg Oral Q0600  . mouth rinse  15 mL Mouth Rinse q12n4p  . metoprolol succinate  12.5 mg Oral BID  . multivitamin with minerals  1 tablet Oral Daily  . pantoprazole  40 mg Oral QPM  . potassium chloride  40 mEq Oral BID  . predniSONE  20 mg Oral Q breakfast  . simvastatin  40 mg Oral q1800   Continuous Infusions: . sodium chloride Stopped (01/23/19 1719)  . cefTRIAXone (ROCEPHIN)  IV Stopped (01/24/19 1000)     LOS: 8 days    Time spent: 25 minutes spent in the coordination of care today.   Jonnie Finner, DO [facility] Pager [phone]  If 7PM-7AM, please contact night-coverage [email or web address] Password [facility] 01/25/2019, 7:29 AM

## 2019-01-25 NOTE — Progress Notes (Signed)
Physical Therapy Treatment Patient Details Name: [name] MRN: [id number] DOB: June 19, 1941 Today's Date: 01/25/2019    History of Present Illness 78 y.o. male with past medical history significant for coronary artery disease, COPD not on home oxygen, diabetes, restless leg syndrome, OSA on CPAP, ACDF C3-6, and admitted for Acute respiratory failure with  hypoxia and hypercapnia secondary to CAP and acute on chronic diastolic heart failure    PT Comments    Pt progressing slowly but steadily with mobility.   Follow Up Recommendations  Home health PT;SNF     Equipment Recommendations  None recommended by PT    Recommendations for Other Services       Precautions / Restrictions Precautions Precautions: Fall Precaution Comments: monitor sats Restrictions Weight Bearing Restrictions: No    Mobility  Bed Mobility Overal bed mobility: Needs Assistance Bed Mobility: Supine to Sit     Supine to sit: Min assist;Mod assist     General bed mobility comments: Increased time with assist to bring trunk to sitting   Transfers Overall transfer level: Needs assistance Equipment used: Rolling walker (2 wheeled) Transfers: Sit to/from Stand Sit to Stand: +2 physical assistance;+2 safety/equipment;Mod assist         General transfer comment: cues for use of UEs to self assist.  Physical assist to bring wt up and fwd and to balance in standing  Ambulation/Gait Ambulation/Gait assistance: Min assist;Mod assist;+2 safety/equipment Gait Distance (Feet): 14 Feet(and additional 9') Assistive device: Rolling walker (2 wheeled) Gait Pattern/deviations: Step-to pattern;Decreased step length - right;Decreased step length - left;Shuffle;Trunk flexed Gait velocity: decr   General Gait Details: cues for posture and position from RW.  Physical assist for support (knees buckling) and balance   Stairs             Wheelchair Mobility    Modified Rankin (Stroke Patients Only)        Balance Overall balance assessment: Needs assistance Sitting-balance support: Feet supported;No upper extremity supported Sitting balance-Leahy Scale: Fair     Standing balance support: Bilateral upper extremity supported Standing balance-Leahy Scale: Poor                              Cognition Arousal/Alertness: Awake/alert Behavior During Therapy: WFL for tasks assessed/performed Overall Cognitive Status: Within Functional Limits for tasks assessed                                        Exercises      General Comments        Pertinent Vitals/Pain Pain Assessment: No/denies pain    Home Living                      Prior Function            PT Goals (current goals can now be found in the care plan section) Acute Rehab PT Goals Patient Stated Goal: Walk further PT Goal Formulation: With patient Time For Goal Achievement: 02/03/19 Potential to Achieve Goals: Fair Progress towards PT goals: Progressing toward goals    Frequency    Min 3X/week      PT Plan Current plan remains appropriate    Co-evaluation              AM-PAC PT "6 Clicks" Mobility   Outcome Measure  Help needed turning from  your back to your side while in a flat bed without using bedrails?: A Little Help needed moving from lying on your back to sitting on the side of a flat bed without using bedrails?: A Lot Help needed moving to and from a bed to a chair (including a wheelchair)?: A Lot Help needed standing up from a chair using your arms (e.g., wheelchair or bedside chair)?: A Lot Help needed to walk in hospital room?: A Lot Help needed climbing 3-5 steps with a railing? : A Lot 6 Click Score: 13    End of Session Equipment Utilized During Treatment: Gait belt;Oxygen Activity Tolerance: Patient tolerated treatment well;Patient limited by fatigue Patient left: in chair;with call bell/phone within reach;with chair alarm set Nurse  Communication: Mobility status PT Visit Diagnosis: Other abnormalities of gait and mobility (R26.89);Difficulty in walking, not elsewhere classified (R26.2)     Time: 7014-1030 PT Time Calculation (min) (ACUTE ONLY): 32 min  Charges:  $Gait Training: 23-37 mins                     [place] Pager [phone] Office [phone]    [place]burn Maiolo 01/25/2019, 1:25 PM

## 2019-01-25 NOTE — Progress Notes (Signed)
Progress Note  Patient Name: [name] Date of Encounter: 01/25/2019  Primary Cardiologist: Minus Breeding, MD   Subjective   Still short of breath, O2 requirements.  Inpatient Medications    Scheduled Meds: . allopurinol  300 mg Oral Daily  . baclofen  10 mg Oral TID  . budesonide (PULMICORT) nebulizer solution  0.25 mg Nebulization BID  . carbidopa-levodopa  1 tablet Oral Daily  . chlorhexidine  15 mL Mouth Rinse BID  . Chlorhexidine Gluconate Cloth  6 each Topical Daily  . colchicine  0.6 mg Oral Daily  . enoxaparin (LOVENOX) injection  40 mg Subcutaneous Q24H  . furosemide  60 mg Intravenous Q12H  . guaiFENesin  600 mg Oral BID  . ipratropium-albuterol  3 mL Nebulization TID  . latanoprost  1 drop Both Eyes QHS  . levothyroxine  200 mcg Oral Q0600  . mouth rinse  15 mL Mouth Rinse q12n4p  . metoprolol succinate  12.5 mg Oral BID  . multivitamin with minerals  1 tablet Oral Daily  . pantoprazole  40 mg Oral QPM  . potassium chloride  40 mEq Oral BID  . predniSONE  10 mg Oral Q breakfast  . simvastatin  40 mg Oral q1800   Continuous Infusions: . sodium chloride Stopped (01/23/19 1719)  . cefTRIAXone (ROCEPHIN)  IV Stopped (01/24/19 1000)   PRN Meds: sodium chloride, acetaminophen **OR** acetaminophen, HYDROcodone-acetaminophen, nitroGLYCERIN, ondansetron **OR** ondansetron (ZOFRAN) IV, polyethylene glycol, sodium chloride   Vital Signs    Vitals:   01/24/19 2111 01/24/19 2128 01/25/19 0517 01/25/19 0848  BP: (!) 165/77  126/71   Pulse: 78  68   Resp: 16  14   Temp: 98.3 F (36.8 C)  98.3 F (36.8 C)   TempSrc: Oral  Oral   SpO2: 95% 95% 97% 94%  Weight:      Height:        Intake/Output Summary (Last 24 hours) at 01/25/2019 0920 Last data filed at 01/25/2019 0600 Gross per 24 hour  Intake 457.04 ml  Output 1800 ml  Net -1342.96 ml   Last 3 Weights 01/24/2019 01/24/2019 01/23/2019  Weight (lbs) 190 lb 3.2 oz 192 lb 7.4 oz 191 lb 2.2 oz  Weight (kg)  86.274 kg 87.3 kg 86.7 kg      Telemetry    Normal rhythm- Personally Reviewed  ECG    Normal sinus rhythm- Personally Reviewed  Physical Exam   GEN: No acute distress.   Neck: No JVD Cardiac: RRR, no murmurs, rubs, or gallops.  Respiratory: Clear to auscultation bilaterally. GI: Soft, nontender, non-distended  MS: No edema; No deformity. Neuro:  Nonfocal  Psych: Normal affect   Labs    High Sensitivity Troponin:  No results for input(s): TROPONINIHS in the last 720 hours.    Cardiac EnzymesNo results for input(s): TROPONINI in the last 168 hours. No results for input(s): TROPIPOC in the last 168 hours.   Chemistry Recent Labs  Lab 01/23/19 0210 01/24/19 0504 01/24/19 0808 01/25/19 0407  NA 143  --  144 141  K 3.1*  --  3.4* 3.5  CL 92*  --  97* 97*  CO2 38*  --  34* 33*  GLUCOSE 103*  --  107* 107*  BUN 31*  --  33* 34*  CREATININE 0.92 1.01 0.89 0.80  CALCIUM 8.9  --  9.0 9.1  PROT  --   --  6.9  --   ALBUMIN 3.5  --  3.6 3.7  AST  --   --  18  --   ALT  --   --  21  --   ALKPHOS  --   --  48  --   BILITOT  --   --  0.2*  --   GFRNONAA >60 >60 >60 >60  GFRAA >60 >60 >60 >60  ANIONGAP 13  --  13 11     Hematology Recent Labs  Lab 01/23/19 0422 01/24/19 0808 01/25/19 0407  WBC 12.7* 12.6* 15.0*  RBC 4.65 4.51 4.45  HGB 13.3 12.7* 13.0  HCT 43.1 42.1 41.0  MCV 92.7 93.3 92.1  MCH 28.6 28.2 29.2  MCHC 30.9 30.2 31.7  RDW 15.2 15.2 15.3  PLT 260 271 276    BNPNo results for input(s): BNP, PROBNP in the last 168 hours.   DDimer No results for input(s): DDIMER in the last 168 hours.   Radiology    [place] Chest (pleural Effusion)  Result Date: 01/24/2019 CLINICAL DATA:  78 year old male referred for possible thoracentesis EXAM: CHEST ULTRASOUND COMPARISON:  None. FINDINGS: Ultrasound of the chest reveals scant pleural fluid bilaterally. Thoracentesis not performed. IMPRESSION: Scant pleural fluid bilaterally. Electronically Signed   By: Corrie Mckusick D.O.   On: 01/24/2019 14:10   Dg Chest Port 1 View  Result Date: 01/23/2019 CLINICAL DATA:  Worsening cough and shortness of breath EXAM: PORTABLE CHEST 1 VIEW COMPARISON:  01/21/2019, CT 01/20/2019 FINDINGS: Moderate bilateral pleural effusions, similar compared to prior radiograph. Continued bibasilar consolidations. Enlarged cardiomediastinal silhouette with central vascular congestion. No pneumothorax. IMPRESSION: No significant change in moderate bilateral pleural effusions and bibasilar airspace disease. Cardiomegaly. Electronically Signed   By: Donavan Foil M.D.   On: 01/23/2019 15:38    Cardiac Studies   EF 50 to 55%, left atrial size was moderately dilated with calcification of the aortic valve but no stenosis  Patient Profile     78 y.o. male underlying COPD, continued hypoxia, acute on chronic diastolic heart failure  Assessment & Plan    Acute on chronic diastolic heart failure in combination with COPD - Seems to make sense to continue with IV diuresis.  Will increase to 80 mg IV twice daily.  Renal function stable. - He was not wearing oxygen at home but could this be more of a manifestation of his underlying COPD then perhaps heart failure.  Nonetheless, we will empirically continue to diurese.       For questions or updates, please contact [place] Please consult [email or web address] for contact info under        Signed, Candee Furbish, MD  01/25/2019, 9:20 AM

## 2019-01-26 LAB — RENAL FUNCTION PANEL
Albumin: 3.8 g/dL (ref 3.5–5.0)
Anion gap: 14 (ref 5–15)
BUN: 36 mg/dL — ABNORMAL HIGH (ref 8–23)
CO2: 33 mmol/L — ABNORMAL HIGH (ref 22–32)
Calcium: 9.2 mg/dL (ref 8.9–10.3)
Chloride: 97 mmol/L — ABNORMAL LOW (ref 98–111)
Creatinine, Ser: 0.83 mg/dL (ref 0.61–1.24)
GFR calc Af Amer: >60 mL/min (ref >60)
GFR calc non Af Amer: >60 mL/min (ref >60)
Glucose, Bld: 111 mg/dL — ABNORMAL HIGH (ref 70–99)
Phosphorus: 4.5 mg/dL (ref 2.5–4.6)
Potassium: 3.5 mmol/L (ref 3.5–5.1)
Sodium: 144 mmol/L (ref 135–145)

## 2019-01-26 LAB — CBC WITH DIFFERENTIAL/PLATELET
Abs Immature Granulocytes: 0.19 10*3/uL — ABNORMAL HIGH (ref 0.00–0.07)
Basophils Absolute: 0.1 10*3/uL (ref 0.0–0.1)
Basophils Relative: 1 %
Eosinophils Absolute: 0.3 10*3/uL (ref 0.0–0.5)
Eosinophils Relative: 2 %
HCT: 41.7 % (ref 39.0–52.0)
Hemoglobin: 12.8 g/dL — ABNORMAL LOW (ref 13.0–17.0)
Immature Granulocytes: 1 %
Lymphocytes Relative: 16 %
Lymphs Abs: 2.4 10*3/uL (ref 0.7–4.0)
MCH: 28.4 pg (ref 26.0–34.0)
MCHC: 30.7 g/dL (ref 30.0–36.0)
MCV: 92.5 fL (ref 80.0–100.0)
Monocytes Absolute: 0.9 10*3/uL (ref 0.1–1.0)
Monocytes Relative: 6 %
Neutro Abs: 11.3 10*3/uL — ABNORMAL HIGH (ref 1.7–7.7)
Neutrophils Relative %: 74 %
Platelets: 307 10*3/uL (ref 150–400)
RBC: 4.51 MIL/uL (ref 4.22–5.81)
RDW: 15.3 % (ref 11.5–15.5)
WBC: 15.2 10*3/uL — ABNORMAL HIGH (ref 4.0–10.5)
nRBC: 0 % (ref 0.0–0.2)

## 2019-01-26 LAB — MAGNESIUM: Magnesium: 2.7 mg/dL — ABNORMAL HIGH (ref 1.7–2.4)

## 2019-01-26 MED ORDER — HYDROCORTISONE (PERIANAL) 2.5 % EX CREA
TOPICAL_CREAM | Freq: Three times a day (TID) | CUTANEOUS | Status: DC | PRN
  Filled 2019-01-26: qty 28.35

## 2019-01-26 NOTE — Plan of Care (Signed)
Pt up to chair for almost 8 hours today, tolerated well, up with 2 max assist.

## 2019-01-26 NOTE — Progress Notes (Signed)
[name] [facility]  PROGRESS NOTE    [name]  KKX:[id number] DOB: August 24, 1940 DOA: 01/17/2019 PCP: [name], MD   Brief Narrative:   [name] a 78 y.o.malewith past medical history significant for coronary artery disease, COPD not on home oxygen, diabetes, restless leg syndrome, OSA on CPAP, who presents complaining of worsening cough and shortness of breath for the last 2 days prior to admission. On arrival he was febrile, and CXR shows right sided pneumonia.   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  - ABG shows respiratory acidosis due to hypercapnia and hypoxia.  - BIPAP as needed. Repeat  - ABG shows PH of 7.34, but persistent elevation of PCO2. - IV rocephin and zithromax; end zithro 01/23/2019.  - Tapering steroids - Bld Cx NTD - White count down to 12, afebrile ON - CT of the chest without contrast showed bilateral layering pleural effusions and right mid lobe consolidation.  - PCCM consulted; appreciate assistance - had to go back on BiPap last night d/t difficulty maintaining SpO2 on 15L. He tells me that he uses CPap at night, so we can have him on the equivalent while sleeping; currently on 5-6L [place] during my interveiw. - he's not moving much more on his O2 use right now; abx tx finishes today; he does look good enough to TTF; will ask cards to see for AoC dHF, if there are no new drips added, will TTF to med-tele - outside of continued lasix; only thing left to address would be pleural effusion. Will  rpt CXR; speak with IR for tap - taken to IR for thoracentesis, unable to safely perform per IR ; see note for details; ok to TTF-telemetry - now on lasix 80mg  IV BID     - down to 1L [place]; get 6MWT, may need to go home on O2 at this point  Sepsis possibly from proteus UTI and community acquired pneumonia.  - Fever resolved, blood cultures have been negative.  - Urine cultures show proteus, sensitive to rocephin     - stop rocephin  CAP     - see above  UTI     - see above  Acute on chronic diastolic heart failure: - Improving - Continue with IV lasix 40 mg BID.  - Continue with strict intake and output.  - Creatine appears to be at baseline with IV diuresis.  - Replace potassium. - have consulted cardiology     - now on IV lasix 80mg  BID     - appreciate cards assistance  COPD:  - Resume duonebs. Pt not wheezing at this time.     - pulmicort, duobnebs, mucinex     - down to 1L [place]; get 6MWT  Gout:  - Continue with colchicine and allopurinol.   Mild anemia of chronic disease:  - no evidence of bleed, monitor  Hypertension:  - BP acceptable  - lasix  Hypokalemia:  - replete, monitor  Debility/Generalized weakness - 2-3 person max assist to transfer from bed to chair - lives w/ wife at  home; doubtful she has the ability on own to assist him around house - he does note that he works w/ walker and wheelchair at home  He is improving. Appreciate cardiology assistance. Down to 1L [place]. Let's get 6MWT. Still may ultimately need to go home on O2. Continue IS. Hopeful for d/c soon.   DVT prophylaxis:lovenox Code Status:DNR Disposition Plan:TBD  Consultants:  Cardiology  PCCM   ROS:  Denies CP, N, V. Dypnea improving. Remainder 10-pt ROS is negative for all not previously mentioned.  Subjective: "My wife is doing better."  Objective: Vitals:   01/25/19 2112 01/25/19  2237 01/26/19 0555 01/26/19 0817  BP: 132/69  100/71   Pulse: 73 75 76   Resp: 20 18 16    Temp: 98.7 F (37.1 C)  97.8 F (36.6 C)   TempSrc: Oral  Axillary   SpO2: 95% 98% 93% 94%  Weight:   86.3 kg   Height:        Intake/Output Summary (Last 24 hours) at 01/26/2019 0929 Last data filed at 01/26/2019 0553 Gross per 24 hour  Intake 546.13 ml  Output 3575 ml  Net -3028.87 ml   Filed Weights   01/24/19 0252 01/24/19 1719 01/26/19 0555  Weight: 87.3 kg 86.3 kg 86.3 kg    Examination:  General:77 y.o.maleresting in chair in NAD Eyes: PERRL, normal sclera ENMT: Nares patent w/o discharge, orophaynx clear, dentition normal, ears w/o discharge/lesions/ulcers Cardiovascular: RRR, +S1, S2, no m/g/r, equal pulses throughout Respiratory:CTBAL, no w/r/r GI: BS+, NDNT, no masses noted, no organomegaly noted MSK: No e/c/c Skin: No rashes, bruises, ulcerations noted Neuro:alert to name, follows commands Psyc: Appropriate interaction and affect, calm/cooperative   Data Reviewed: I have personally reviewed following labs and imaging studies.  CBC: Recent Labs  Lab 01/21/19 1204 01/23/19 0422 01/24/19 0808 01/25/19 0407 01/26/19 0230  WBC 12.1* 12.7* 12.6* 15.0* 15.2*  NEUTROABS 10.4* 9.0* 9.3* 10.7* 11.3*  HGB 13.7 13.3 12.7* 13.0 12.8*  HCT 42.9 43.1 42.1 41.0 41.7  MCV 91.9 92.7 93.3 92.1 92.5  PLT 267 260 271 276 132   Basic Metabolic Panel: Recent Labs  Lab 01/21/19 1204 01/23/19 0210 01/24/19 0504 01/24/19 0808 01/25/19 0407 01/26/19 0230  NA 141 143  --  144 141 144  K 3.3* 3.1*  --  3.4* 3.5 3.5  CL 89* 92*  --  97* 97* 97*  CO2 37* 38*  --  34* 33* 33*  GLUCOSE 165* 103*  --  107* 107* 111*  BUN 28* 31*  --  33* 34* 36*  CREATININE 0.80 0.92 1.01 0.89 0.80 0.83  CALCIUM 8.7* 8.9  --  9.0 9.1 9.2  MG  --  2.2  --  2.4 2.4 2.7*  PHOS  --  3.2  --   --  4.5 4.5   GFR: Estimated Creatinine Clearance: 78.2 mL/min (by C-G formula based on SCr of 0.83  mg/dL). Liver Function Tests: Recent Labs  Lab 01/23/19 0210 01/24/19 0808 01/25/19 0407 01/26/19 0230  AST  --  18  --   --   ALT  --  21  --   --   ALKPHOS  --  48  --   --   BILITOT  --  0.2*  --   --   PROT  --  6.9  --   --   ALBUMIN 3.5 3.6 3.7 3.8   No results for input(s): LIPASE, AMYLASE in the last 168 hours. No results for input(s): AMMONIA in  the last 168 hours. Coagulation Profile: No results for input(s): INR, PROTIME in the last 168 hours. Cardiac Enzymes: No results for input(s): CKTOTAL, CKMB, CKMBINDEX, TROPONINI in the last 168 hours. BNP (last 3 results) No results for input(s): PROBNP in the last 8760 hours. HbA1C: No results for input(s): HGBA1C in the last 72 hours. CBG: No results for input(s): GLUCAP in the last 168 hours. Lipid Profile: No results for input(s): CHOL, HDL, LDLCALC, TRIG, CHOLHDL, LDLDIRECT in the last 72 hours. Thyroid Function Tests: No results for input(s): TSH, T4TOTAL, FREET4, T3FREE, THYROIDAB in the last 72 hours. Anemia Panel: No results for input(s): VITAMINB12, FOLATE, FERRITIN, TIBC, IRON, RETICCTPCT in the last 72 hours. Sepsis Labs: No results for input(s): PROCALCITON, LATICACIDVEN in the last 168 hours.  Recent Results (from the past 240 hour(s))  Blood Culture (routine x 2)     Status: None   Collection Time: 01/17/19 11:12 AM   Specimen: BLOOD  Result Value Ref Range Status   Specimen Description   Final    BLOOD RIGHT ARM Performed at [facility] [address], [place], [place] [address]    Special Requests   Final    BOTTLES DRAWN AEROBIC AND ANAEROBIC Blood Culture adequate volume Performed at [facility] [address], [place], [place] [address]    Culture   Final    NO GROWTH 5 DAYS Performed at [facility], [place] [address], [place], [place] [address]    Report Status 01/22/2019 FINAL  Final  Blood Culture (routine x 2)     Status: None   Collection  Time: 01/17/19 11:12 AM   Specimen: BLOOD  Result Value Ref Range Status   Specimen Description   Final    BLOOD LEFT ARM Performed at [facility] [address], [place], [place] [address]    Special Requests   Final    BOTTLES DRAWN AEROBIC AND ANAEROBIC Blood Culture adequate volume Performed at [facility] [address], [place], [place] [address]    Culture   Final    NO GROWTH 5 DAYS Performed at [facility], [place] [address] [place], [place] [address]    Report Status 01/22/2019 FINAL  Final  SARS Coronavirus 2 [facility] order, Performed in [facility] hospital lab) Nasopharyngeal Nasopharyngeal Swab     Status: None   Collection Time: 01/17/19 11:12 AM   Specimen: Nasopharyngeal Swab  Result Value Ref Range Status   SARS Coronavirus 2 NEGATIVE NEGATIVE Final    Comment: (NOTE) If result is NEGATIVE SARS-CoV-2 target nucleic acids are NOT DETECTED. The SARS-CoV-2 RNA is generally detectable in upper and lower  respiratory specimens during the acute phase of infection. The lowest  concentration of SARS-CoV-2 viral copies this assay can detect is 250  copies / mL. A negative result does not preclude SARS-CoV-2 infection  and should not be used as the sole basis for treatment or other  patient management decisions.  A negative result may occur with  improper specimen collection / handling, submission of specimen other  than nasopharyngeal swab, presence of viral mutation(s) within the  areas targeted by this assay, and inadequate number of viral copies  (<250 copies / mL). A negative result must be combined with clinical  observations, patient history, and epidemiological information. If result is POSITIVE SARS-CoV-2 target nucleic acids are DETECTED. The SARS-CoV-2 RNA is generally detectable in upper and lower  respiratory specimens dur ing the  acute phase of infection.  Positive  results are indicative of active infection  with SARS-CoV-2.  Clinical  correlation with patient history and other diagnostic information is  necessary to determine patient infection status.  Positive results do  not rule out bacterial infection or co-infection with other viruses. If result is PRESUMPTIVE POSTIVE SARS-CoV-2 nucleic acids MAY BE PRESENT.   A presumptive positive result was obtained on the submitted specimen  and confirmed on repeat testing.  While 2019 novel coronavirus  (SARS-CoV-2) nucleic acids may be present in the submitted sample  additional confirmatory testing may be necessary for epidemiological  and / or clinical management purposes  to differentiate between  SARS-CoV-2 and other Sarbecovirus currently known to infect humans.  If clinically indicated additional testing with an alternate test  methodology [phone]) is advised. The SARS-CoV-2 RNA is generally  detectable in upper and lower respiratory sp ecimens during the acute  phase of infection. The expected result is Negative. Fact Sheet for Patients:  StrictlyIdeas.no Fact Sheet for Healthcare Providers: BankingDealers.co.za This test is not yet approved or cleared by the [place] FDA and has been authorized for detection and/or diagnosis of SARS-CoV-2 by FDA under an Emergency Use Authorization (EUA).  This EUA will remain in effect (meaning this test can be used) for the duration of the COVID-19 declaration under Section 564(b)(1) of the Act, 21 U.S.C. section 360bbb-3(b)(1), unless the authorization is terminated or revoked sooner. Performed at [facility], [place] [address], [place], [place] [address]   Urine culture     Status: Abnormal   Collection Time: 01/17/19 12:34 PM   Specimen: In/Out Cath Urine  Result Value Ref Range Status   Specimen Description   Final    IN/OUT CATH URINE Performed at [facility] [address], [place], [place] [address]     Special Requests   Final    NONE Performed at [facility], [place] [address] [place], [place] [address]    Culture (A)  Final    80,000 COLONIES/mL PROTEUS MIRABILIS 80,000 COLONIES/mL DIPHTHEROIDS(CORYNEBACTERIUM SPECIES) Standardized susceptibility testing for this organism is not available. Performed at [facility], [place] [address] [place], [place] [address]    Report Status 01/21/2019 FINAL  Final   Organism ID, Bacteria PROTEUS MIRABILIS (A)  Final      Susceptibility   Proteus mirabilis - MIC*    AMPICILLIN <=2 SENSITIVE Sensitive     CEFAZOLIN <=4 SENSITIVE Sensitive     CEFTRIAXONE <=1 SENSITIVE Sensitive     CIPROFLOXACIN >=4 RESISTANT Resistant     GENTAMICIN <=1 SENSITIVE Sensitive     IMIPENEM 1 SENSITIVE Sensitive     NITROFURANTOIN >=512 RESISTANT Resistant     TRIMETH/SULFA <=20 SENSITIVE Sensitive     AMPICILLIN/SULBACTAM <=2 SENSITIVE Sensitive     PIP/TAZO <=4 SENSITIVE Sensitive     * 80,000 COLONIES/mL PROTEUS MIRABILIS  MRSA PCR Screening     Status: None   Collection Time: 01/17/19  8:42 PM   Specimen: Nasal Mucosa; Nasopharyngeal  Result Value Ref Range Status   MRSA by PCR NEGATIVE NEGATIVE Final    Comment:        The GeneXpert MRSA Assay (FDA approved for NASAL specimens only), is one component of a comprehensive MRSA colonization surveillance program. It is not intended to diagnose MRSA infection nor to guide or monitor treatment for MRSA infections. Performed at [facility], [place] [address], [place], [place] [address]  SARS Coronavirus 2 [facility] order, Performed in [facility] hospital lab)     Status: None   Collection Time: 01/24/19  8:19 AM  Result Value Ref Range Status   SARS Coronavirus 2 NEGATIVE NEGATIVE Final    Comment: (NOTE) If result is NEGATIVE SARS-CoV-2 target nucleic acids are NOT DETECTED. The SARS-CoV-2 RNA is generally detectable in upper and lower  respiratory  specimens during the acute phase of infection. The lowest  concentration of SARS-CoV-2 viral copies this assay can detect is 250  copies / mL. A negative result does not preclude SARS-CoV-2 infection  and should not be used as the sole basis for treatment or other  patient management decisions.  A negative result may occur with  improper specimen collection / handling, submission of specimen other  than nasopharyngeal swab, presence of viral mutation(s) within the  areas targeted by this assay, and inadequate number of viral copies  (<250 copies / mL). A negative result must be combined with clinical  observations, patient history, and epidemiological information. If result is POSITIVE SARS-CoV-2 target nucleic acids are DETECTED. The SARS-CoV-2 RNA is generally detectable in upper and lower  respiratory specimens dur ing the acute phase of infection.  Positive  results are indicative of active infection with SARS-CoV-2.  Clinical  correlation with patient history and other diagnostic information is  necessary to determine patient infection status.  Positive results do  not rule out bacterial infection or co-infection with other viruses. If result is PRESUMPTIVE POSTIVE SARS-CoV-2 nucleic acids MAY BE PRESENT.   A presumptive positive result was obtained on the submitted specimen  and confirmed on repeat testing.  While 2019 novel coronavirus  (SARS-CoV-2) nucleic acids may be present in the submitted sample  additional confirmatory testing may be necessary for epidemiological  and / or clinical management purposes  to differentiate between  SARS-CoV-2 and other Sarbecovirus currently known to infect humans.  If clinically indicated additional testing with an alternate test  methodology [phone]) is advised. The SARS-CoV-2 RNA is generally  detectable in upper and lower respiratory sp ecimens during the acute  phase of infection. The expected result is Negative. Fact Sheet for  Patients:  StrictlyIdeas.no Fact Sheet for Healthcare Providers: BankingDealers.co.za This test is not yet approved or cleared by the [place] FDA and has been authorized for detection and/or diagnosis of SARS-CoV-2 by FDA under an Emergency Use Authorization (EUA).  This EUA will remain in effect (meaning this test can be used) for the duration of the COVID-19 declaration under Section 564(b)(1) of the Act, 21 U.S.C. section 360bbb-3(b)(1), unless the authorization is terminated or revoked sooner. Performed at [facility], [place] [address] [place], [place] [address]       Radiology Studies: [place] Chest (pleural Effusion)  Result Date: 01/24/2019 CLINICAL DATA:  78 year old male referred for possible thoracentesis EXAM: CHEST ULTRASOUND COMPARISON:  None. FINDINGS: Ultrasound of the chest reveals scant pleural fluid bilaterally. Thoracentesis not performed. IMPRESSION: Scant pleural fluid bilaterally. Electronically Signed   By: Corrie Mckusick D.O.   On: 01/24/2019 14:10     Scheduled Meds: . allopurinol  300 mg Oral Daily  . baclofen  10 mg Oral TID  . budesonide (PULMICORT) nebulizer solution  0.25 mg Nebulization BID  . carbidopa-levodopa  1 tablet Oral Daily  . chlorhexidine  15 mL Mouth Rinse BID  . colchicine  0.6 mg Oral Daily  . enoxaparin (LOVENOX) injection  40 mg Subcutaneous Q24H  . furosemide  80 mg  Intravenous Q12H  . guaiFENesin  600 mg Oral BID  . ipratropium-albuterol  3 mL Nebulization TID  . latanoprost  1 drop Both Eyes QHS  . levothyroxine  200 mcg Oral Q0600  . mouth rinse  15 mL Mouth Rinse q12n4p  . metoprolol succinate  12.5 mg Oral BID  . multivitamin with minerals  1 tablet Oral Daily  . pantoprazole  40 mg Oral QPM  . potassium chloride  40 mEq Oral BID  . simvastatin  40 mg Oral q1800   Continuous Infusions: . sodium chloride 250 mL (01/25/19 2106)     LOS: 9 days    Time  spent: 25 minutes spent in the coordination of care today.   Jonnie Finner, DO [facility] Pager [phone]  If 7PM-7AM, please contact night-coverage [email or web address] Password [email or web address] 01/26/2019, 9:29 AM

## 2019-01-26 NOTE — Plan of Care (Signed)
Patient has an external catheter

## 2019-01-26 NOTE — Progress Notes (Signed)
Physical Therapy Treatment Patient Details Name: [name] MRN: [id number] DOB: [birth year] Today's Date: 01/26/2019    History of Present Illness 78 y.o. male with past medical history significant for coronary artery disease, COPD not on home oxygen, diabetes, restless leg syndrome, OSA on CPAP, ACDF C3-6, and admitted for Acute respiratory failure with  hypoxia and hypercapnia secondary to CAP and acute on chronic diastolic heart failure    PT Comments    Pt continues motivated but limited ambulation this date 2* LE fatigue.  Pt incontinent of bowel with initial attempt to stand and then moved sit to partial stand multiple times ("it helps me finish the bowel movement").  Pt then stood for hygiene but unable to stabilize enough to attempt ambulation 2* LE fatigue and returned to sitting.     Follow Up Recommendations  Home health PT;SNF     Equipment Recommendations  None recommended by PT    Recommendations for Other Services       Precautions / Restrictions Precautions Precautions: Fall Precaution Comments: monitor sats Restrictions Weight Bearing Restrictions: No    Mobility  Bed Mobility               General bed mobility comments: Pt up in chair and requests back to same for lunch  Transfers Overall transfer level: Needs assistance Equipment used: Rolling walker (2 wheeled) Transfers: Sit to/from Stand Sit to Stand: +2 physical assistance;+2 safety/equipment;Mod assist         General transfer comment: cues for use of UEs to self assist.  Physical assist to bring wt up and fwd and to balance in standing  Ambulation/Gait                 Stairs             Wheelchair Mobility    Modified Rankin (Stroke Patients Only)       Balance Overall balance assessment: Needs assistance Sitting-balance support: Feet supported;No upper extremity supported Sitting balance-Leahy Scale: Good     Standing balance support: Bilateral upper  extremity supported Standing balance-Leahy Scale: Poor                              Cognition Arousal/Alertness: Awake/alert Behavior During Therapy: WFL for tasks assessed/performed Overall Cognitive Status: Within Functional Limits for tasks assessed                                        Exercises      General Comments        Pertinent Vitals/Pain Pain Assessment: No/denies pain    Home Living                      Prior Function            PT Goals (current goals can now be found in the care plan section) Acute Rehab PT Goals Patient Stated Goal: Walk further PT Goal Formulation: With patient Time For Goal Achievement: 02/03/19 Potential to Achieve Goals: Fair Progress towards PT goals: Not progressing toward goals - comment    Frequency    Min 3X/week      PT Plan Current plan remains appropriate    Co-evaluation              AM-PAC PT "6 Clicks" Mobility   Outcome Measure  Help needed turning from your back to your side while in a flat bed without using bedrails?: A Little Help needed moving from lying on your back to sitting on the side of a flat bed without using bedrails?: A Lot Help needed moving to and from a bed to a chair (including a wheelchair)?: A Lot Help needed standing up from a chair using your arms (e.g., wheelchair or bedside chair)?: A Lot Help needed to walk in hospital room?: A Lot Help needed climbing 3-5 steps with a railing? : A Lot 6 Click Score: 13    End of Session Equipment Utilized During Treatment: Gait belt;Oxygen Activity Tolerance: Patient limited by fatigue Patient left: in chair;with call bell/phone within reach;with chair alarm set Nurse Communication: Mobility status;Need for lift equipment PT Visit Diagnosis: Other abnormalities of gait and mobility (R26.89);Difficulty in walking, not elsewhere classified (R26.2)     Time: 6754-4920 PT Time Calculation (min) (ACUTE  ONLY): 32 min  Charges:  $Therapeutic Activity: 23-37 mins                     Debe Coder PT Acute Rehabilitation Services Pager [phone] Office [phone]    Awanda Wilcock 01/26/2019, 2:06 PM

## 2019-01-26 NOTE — Progress Notes (Signed)
Progress Note  Patient Name: [name] Date of Encounter: 01/26/2019  Primary Cardiologist: Minus Breeding, MD   Subjective   Breathing is a little bit improved but still not at baseline.  Sitting up, just ate breakfast.  Inpatient Medications    Scheduled Meds: . allopurinol  300 mg Oral Daily  . baclofen  10 mg Oral TID  . budesonide (PULMICORT) nebulizer solution  0.25 mg Nebulization BID  . carbidopa-levodopa  1 tablet Oral Daily  . chlorhexidine  15 mL Mouth Rinse BID  . Chlorhexidine Gluconate Cloth  6 each Topical Daily  . colchicine  0.6 mg Oral Daily  . enoxaparin (LOVENOX) injection  40 mg Subcutaneous Q24H  . furosemide  80 mg Intravenous Q12H  . guaiFENesin  600 mg Oral BID  . ipratropium-albuterol  3 mL Nebulization TID  . latanoprost  1 drop Both Eyes QHS  . levothyroxine  200 mcg Oral Q0600  . mouth rinse  15 mL Mouth Rinse q12n4p  . metoprolol succinate  12.5 mg Oral BID  . multivitamin with minerals  1 tablet Oral Daily  . pantoprazole  40 mg Oral QPM  . potassium chloride  40 mEq Oral BID  . simvastatin  40 mg Oral q1800   Continuous Infusions: . sodium chloride 250 mL (01/25/19 2106)   PRN Meds: sodium chloride, acetaminophen **OR** acetaminophen, HYDROcodone-acetaminophen, nitroGLYCERIN, ondansetron **OR** ondansetron (ZOFRAN) IV, polyethylene glycol, sodium chloride   Vital Signs    Vitals:   01/25/19 2112 01/25/19 2237 01/26/19 0555 01/26/19 0817  BP: 132/69  100/71   Pulse: 73 75 76   Resp: 20 18 16    Temp: 98.7 F (37.1 C)  97.8 F (36.6 C)   TempSrc: Oral  Axillary   SpO2: 95% 98% 93% 94%  Weight:   86.3 kg   Height:        Intake/Output Summary (Last 24 hours) at 01/26/2019 0917 Last data filed at 01/26/2019 0553 Gross per 24 hour  Intake 546.13 ml  Output 3575 ml  Net -3028.87 ml   Last 3 Weights 01/26/2019 01/24/2019 01/24/2019  Weight (lbs) 190 lb 4.1 oz 190 lb 3.2 oz 192 lb 7.4 oz  Weight (kg) 86.3 kg 86.274 kg 87.3 kg       Telemetry    Sinus rhythm- Personally Reviewed  ECG    Sinus rhythm- Personally Reviewed  Physical Exam   GEN: No acute distress.   Neck: No JVD Cardiac: RRR, no murmurs, rubs, or gallops.  Respiratory:  Minimal crackles heard at bases bilaterally. GI: Soft, nontender, non-distended  MS: No edema; No deformity. Neuro:  Nonfocal  Psych: Normal affect   Labs    High Sensitivity Troponin:  No results for input(s): TROPONINIHS in the last 720 hours.    Cardiac EnzymesNo results for input(s): TROPONINI in the last 168 hours. No results for input(s): TROPIPOC in the last 168 hours.   Chemistry Recent Labs  Lab 01/24/19 0808 01/25/19 0407 01/26/19 0230  NA 144 141 144  K 3.4* 3.5 3.5  CL 97* 97* 97*  CO2 34* 33* 33*  GLUCOSE 107* 107* 111*  BUN 33* 34* 36*  CREATININE 0.89 0.80 0.83  CALCIUM 9.0 9.1 9.2  PROT 6.9  --   --   ALBUMIN 3.6 3.7 3.8  AST 18  --   --   ALT 21  --   --   ALKPHOS 48  --   --   BILITOT 0.2*  --   --  GFRNONAA >60 >60 >60  GFRAA >60 >60 >60  ANIONGAP 13 11 14      Hematology Recent Labs  Lab 01/24/19 0808 01/25/19 0407 01/26/19 0230  WBC 12.6* 15.0* 15.2*  RBC 4.51 4.45 4.51  HGB 12.7* 13.0 12.8*  HCT 42.1 41.0 41.7  MCV 93.3 92.1 92.5  MCH 28.2 29.2 28.4  MCHC 30.2 31.7 30.7  RDW 15.2 15.3 15.3  PLT 271 276 307    BNPNo results for input(s): BNP, PROBNP in the last 168 hours.   DDimer No results for input(s): DDIMER in the last 168 hours.   Radiology    [place] Chest (pleural Effusion)  Result Date: 01/24/2019 CLINICAL DATA:  78 year old male referred for possible thoracentesis EXAM: CHEST ULTRASOUND COMPARISON:  None. FINDINGS: Ultrasound of the chest reveals scant pleural fluid bilaterally. Thoracentesis not performed. IMPRESSION: Scant pleural fluid bilaterally. Electronically Signed   By: Corrie Mckusick D.O.   On: 01/24/2019 14:10    Cardiac Studies   EF 50 to 55%, left atrial size was moderately dilated with calcification  of the aortic valve but no stenosis  Patient Profile     78 y.o. male underlying COPD, continued hypoxia, acute on chronic diastolic heart failure  Assessment & Plan    Acute on chronic diastolic heart failure in combination with COPD - Seems to make sense to continue with IV diuresis at this point, increased to 80 mg IV twice daily on 01/25/2019.  Renal function stable.  Excellent diuresis yesterday. - He was not wearing oxygen at home but could this be more of a manifestation of his underlying COPD then perhaps heart failure.  Nonetheless, we will empirically continue to diurese. - He is down to 1 L nasal cannula.  Satting 94%.  Improvement.      For questions or updates, please contact [place] Please consult [email or web address] for contact info under        Signed, Candee Furbish, MD  01/26/2019, 9:17 AM

## 2019-01-27 ENCOUNTER — Inpatient Hospital Stay (HOSPITAL_COMMUNITY): Payer: Medicare Other

## 2019-01-27 DIAGNOSIS — R0989 Other specified symptoms and signs involving the circulatory and respiratory systems: Secondary | ICD-10-CM

## 2019-01-27 LAB — CBC WITH DIFFERENTIAL/PLATELET
Abs Immature Granulocytes: 0.14 10*3/uL — ABNORMAL HIGH (ref 0.00–0.07)
Basophils Absolute: 0.1 10*3/uL (ref 0.0–0.1)
Basophils Relative: 1 %
Eosinophils Absolute: 0.3 10*3/uL (ref 0.0–0.5)
Eosinophils Relative: 2 %
HCT: 40 % (ref 39.0–52.0)
Hemoglobin: 12.4 g/dL — ABNORMAL LOW (ref 13.0–17.0)
Immature Granulocytes: 1 %
Lymphocytes Relative: 11 %
Lymphs Abs: 2.2 10*3/uL (ref 0.7–4.0)
MCH: 28.9 pg (ref 26.0–34.0)
MCHC: 31 g/dL (ref 30.0–36.0)
MCV: 93.2 fL (ref 80.0–100.0)
Monocytes Absolute: 1 10*3/uL (ref 0.1–1.0)
Monocytes Relative: 5 %
Neutro Abs: 15.4 10*3/uL — ABNORMAL HIGH (ref 1.7–7.7)
Neutrophils Relative %: 80 %
Platelets: 306 10*3/uL (ref 150–400)
RBC: 4.29 MIL/uL (ref 4.22–5.81)
RDW: 15.5 % (ref 11.5–15.5)
WBC: 19.1 10*3/uL — ABNORMAL HIGH (ref 4.0–10.5)
nRBC: 0 % (ref 0.0–0.2)

## 2019-01-27 LAB — RENAL FUNCTION PANEL
Albumin: 3.6 g/dL (ref 3.5–5.0)
Anion gap: 13 (ref 5–15)
BUN: 38 mg/dL — ABNORMAL HIGH (ref 8–23)
CO2: 30 mmol/L (ref 22–32)
Calcium: 8.9 mg/dL (ref 8.9–10.3)
Chloride: 97 mmol/L — ABNORMAL LOW (ref 98–111)
Creatinine, Ser: 0.88 mg/dL (ref 0.61–1.24)
GFR calc Af Amer: >60 mL/min (ref >60)
GFR calc non Af Amer: >60 mL/min (ref >60)
Glucose, Bld: 98 mg/dL (ref 70–99)
Phosphorus: 3.8 mg/dL (ref 2.5–4.6)
Potassium: 3.5 mmol/L (ref 3.5–5.1)
Sodium: 140 mmol/L (ref 135–145)

## 2019-01-27 LAB — MAGNESIUM: Magnesium: 2.5 mg/dL — ABNORMAL HIGH (ref 1.7–2.4)

## 2019-01-27 MED ORDER — POTASSIUM CHLORIDE CRYS ER 20 MEQ PO TBCR
40.0000 meq | EXTENDED_RELEASE_TABLET | Freq: Once | ORAL | Status: AC
  Administered 2019-01-27: 40 meq via ORAL
  Filled 2019-01-27: qty 2

## 2019-01-27 MED ORDER — FUROSEMIDE 40 MG PO TABS
40.0000 mg | ORAL_TABLET | Freq: Every day | ORAL | Status: DC
  Administered 2019-01-28: 40 mg via ORAL
  Filled 2019-01-27: qty 1

## 2019-01-27 NOTE — Progress Notes (Signed)
[name]  PROGRESS NOTE    [name]  HKV:[id number] DOB: Mar 20, 1941 DOA: 01/17/2019 PCP: Cesar Battles, MD   Brief Narrative:   [name] a 78 y.o.malewith past medical history significant for coronary artery disease, COPD not on home oxygen, diabetes, restless leg syndrome, OSA on CPAP, who presents complaining of worsening cough and shortness of breath for the last 2 days prior to admission. On arrival he was febrile, and CXR shows right sided pneumonia.   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  - ABG shows respiratory acidosis due to hypercapnia and hypoxia.  - BIPAP as needed. Repeat  - ABG shows PH of 7.34, but persistent elevation of PCO2. - IV rocephin and zithromax; endzithro8/11/2018.  - Tapering steroids - Bld Cx NTD - White count down to 12, afebrile ON - CT of the chest without contrast showed bilateral layering pleural effusions and right mid lobe consolidation.  - PCCM consulted; appreciate assistance - had to go back on BiPap last night d/t difficulty maintaining SpO2 on 15L. He tells me that he uses CPap at night, so we can have him on the equivalent while sleeping; currently on 5-6L [place] during my interveiw. - he's not moving much more on his O2 use right now; abx tx finishes today; he does look good enough to TTF; will ask cards to see for AoC dHF, if there are no new drips added, will TTF to med-tele - outside of continued lasix; only thing left to address would be pleural effusion. Will  rpt CXR; speak with IR for tap - taken to IR for thoracentesis, unable to safely perform per IR ; see note for details; ok to TTF-telemetry -now onlasix80mg  IV BID     - down to 1L [place]; get 6MWT, may need to go home on O2 at this point  Sepsis possibly from proteus UTI and community acquired pneumonia.  - Fever resolved, blood cultures have been negative.  - Urine cultures show proteus, sensitive to rocephin - stop rocephin  CAP - see above  UTI - see above  Acute on chronic diastolic heart failure: - Improving - Continue with IV lasix 40 mg BID.  - Continue with strict intake and output.  - Creatine appears to be at baseline with IV diuresis.  - Replace potassium. - have consulted cardiology - now on IV lasix80mg  BID     - appreciate cards assistance     - decrease to 40mg  PO qday     - let's say 88kg is his dry weight     - per cardiology: - Looks like could go home, improved             - Lasix 40mg  PO QD and increase to BID if weight increases 3 pounds.              - Low salt, fluid restrictions  COPD:  - Resume duonebs. Pt not wheezing at this time. - pulmicort, duobnebs, mucinex     - down to 1L [place]; get 6MWT     -  resting O2 on RA is 87%; will order O2 for home  Gout:  - Continue with colchicine and allopurinol.   Mild anemia of chronic disease:  -no evidence of bleed, monitor  Hypertension:  - BP acceptable  - lasix  Hypokalemia:  - replete, monitor  Debility/Generalized weakness - 2-3 person max assist to transfer from bed to chair - lives w/ wife at home; doubtful she has the ability on own to assist him around house - he does note that he works w/ walker and wheelchair at home  Switch him to PO lasix and see if he hold. Needs O2 for home. Orders placed. Hopefully home tomorrow.   DVT prophylaxis:lovenox Code Status:DNR Disposition  Plan:TBD  Consultants:  Cardiology  PCCM  ROS:  Denies CP, N, V. Dypnea improving. Remainder 10-pt ROS is negative for all not previously mentioned.  Subjective: "I'm here."  Objective: Vitals:   01/27/19 0433 01/27/19 0525 01/27/19 0822 01/27/19 1402  BP: (!) 89/58 104/60  100/60  Pulse: 81   85  Resp: 18   18  Temp: 98 F (36.7 C)   99.3 F (37.4 C)  TempSrc: Axillary   Oral  SpO2:  94% 92% 93%  Weight: 87.4 kg     Height:        Intake/Output Summary (Last 24 hours) at 01/27/2019 1457 Last data filed at 01/27/2019 1403 Gross per 24 hour  Intake 1550 ml  Output 3150 ml  Net -1600 ml   Filed Weights   01/24/19 1719 01/26/19 0555 01/27/19 0433  Weight: 86.3 kg 86.3 kg 87.4 kg    Examination:  General:77 y.o.maleresting in chair in NAD Eyes: PERRL, normal sclera ENMT: Nares patent w/o discharge, orophaynx clear, dentition normal, ears w/o discharge/lesions/ulcers Cardiovascular: RRR, +S1, S2, no m/g/r, equal pulses throughout Respiratory:CTBAL, no w/r/r GI: BS+, NDNT, no masses noted, no organomegaly noted MSK: No e/c/c Skin: No rashes, bruises, ulcerations noted Neuro:alert to name, follows commands Psyc: Appropriate interaction and affect, calm/cooperative   Data Reviewed: I have personally reviewed following labs and imaging studies.  CBC: Recent Labs  Lab 01/23/19 0422 01/24/19 0808 01/25/19 0407 01/26/19 0230 01/27/19 0501  WBC 12.7* 12.6* 15.0* 15.2* 19.1*  NEUTROABS 9.0* 9.3* 10.7* 11.3* 15.4*  HGB 13.3 12.7* 13.0 12.8* 12.4*  HCT 43.1 42.1 41.0 41.7 40.0  MCV 92.7 93.3 92.1 92.5 93.2  PLT 260 271 276 307 621   Basic Metabolic Panel: Recent Labs  Lab 01/23/19 0210 01/24/19 0504 01/24/19 0808 01/25/19 0407 01/26/19 0230 01/27/19 0501  NA 143  --  144 141 144 140  K 3.1*  --  3.4* 3.5 3.5 3.5  CL 92*  --  97* 97* 97* 97*  CO2 38*  --  34* 33* 33* 30  GLUCOSE 103*  --  107* 107* 111* 98  BUN 31*  --  33* 34* 36* 38*   CREATININE 0.92 1.01 0.89 0.80 0.83 0.88  CALCIUM 8.9  --  9.0 9.1 9.2 8.9  MG 2.2  --  2.4 2.4 2.7* 2.5*  PHOS 3.2  --   --  4.5 4.5 3.8   GFR: Estimated Creatinine Clearance: 74.2 mL/min (by C-G formula based on SCr of 0.88 mg/dL). Liver Function Tests: Recent Labs  Lab 01/23/19 0210 01/24/19 0808 01/25/19 0407 01/26/19 0230 01/27/19 0501  AST  --  18  --   --   --   ALT  --  21  --   --   --   ALKPHOS  --  48  --   --   --   BILITOT  --  0.2*  --   --   --   PROT  --  6.9  --   --   --   ALBUMIN 3.5 3.6 3.7 3.8 3.6   No results for input(s): LIPASE, AMYLASE in the last 168 hours. No results for input(s): AMMONIA in the last 168 hours. Coagulation Profile: No results for input(s): INR, PROTIME in the last 168 hours. Cardiac Enzymes: No results for input(s): CKTOTAL, CKMB, CKMBINDEX, TROPONINI in the last 168 hours. BNP (last 3 results) No results for input(s): PROBNP in the last 8760 hours. HbA1C: No results for input(s): HGBA1C in the last 72 hours. CBG: No results for input(s): GLUCAP in the last 168 hours. Lipid Profile: No results for input(s): CHOL, HDL, LDLCALC, TRIG, CHOLHDL, LDLDIRECT in the last 72 hours. Thyroid Function Tests: No results for input(s): TSH, T4TOTAL, FREET4, T3FREE, THYROIDAB in the last 72 hours. Anemia Panel: No results for input(s): VITAMINB12, FOLATE, FERRITIN, TIBC, IRON, RETICCTPCT in the last 72 hours. Sepsis Labs: No results for input(s): PROCALCITON, LATICACIDVEN in the last 168 hours.  Recent Results (from the past 240 hour(s))  MRSA PCR Screening     Status: None   Collection Time: 01/17/19  8:42 PM   Specimen: Nasal Mucosa; Nasopharyngeal  Result Value Ref Range Status   MRSA by PCR NEGATIVE NEGATIVE Final    Comment:        The GeneXpert MRSA Assay (FDA approved for NASAL specimens only), is one component of a comprehensive MRSA colonization surveillance program. It is not intended to diagnose MRSA infection nor to  guide or monitor treatment for MRSA infections. Performed at [facility], [place] [address], [place], [place] [address]   SARS Coronavirus 2 [facility] order, Performed in [facility] hospital lab)     Status: None   Collection Time: 01/24/19  8:19 AM  Result Value Ref Range Status   SARS Coronavirus 2 NEGATIVE NEGATIVE Final    Comment: (NOTE) If result is NEGATIVE SARS-CoV-2 target nucleic acids are NOT DETECTED. The SARS-CoV-2 RNA is generally detectable in upper and lower  respiratory specimens during the acute phase of infection. The lowest  concentration of SARS-CoV-2 viral copies this assay can detect is 250  copies / mL. A negative result does not preclude SARS-CoV-2 infection  and should not be used as the sole basis for treatment or other  patient management decisions.  A negative result may occur with  improper specimen collection / handling, submission of specimen other  than nasopharyngeal swab, presence of viral mutation(s) within the  areas targeted by this assay, and inadequate number of viral copies  (<250 copies / mL). A negative result must be combined with clinical  observations, patient history, and epidemiological information. If result is POSITIVE SARS-CoV-2 target nucleic acids are DETECTED. The SARS-CoV-2 RNA is generally detectable in upper and lower  respiratory specimens dur ing the acute phase of infection.  Positive  results are indicative of active infection with SARS-CoV-2.  Clinical  correlation with patient history and other diagnostic information is  necessary to determine patient infection status.  Positive results do  not rule out bacterial infection or co-infection with other viruses. If result is PRESUMPTIVE POSTIVE SARS-CoV-2 nucleic acids MAY BE PRESENT.   A presumptive positive result was obtained on the submitted specimen  and confirmed on repeat testing.  While 2019 novel coronavirus  (SARS-CoV-2) nucleic acids may be  present in the submitted sample  additional confirmatory testing may be necessary for epidemiological  and / or clinical management purposes  to differentiate between  SARS-CoV-2 and other Sarbecovirus currently known to infect humans.  If clinically indicated additional testing with an alternate test  methodology [id number]) is advised. The SARS-CoV-2 RNA is generally  detectable in upper and lower respiratory sp ecimens during the acute  phase of infection. The expected result is Negative. Fact Sheet for Patients:  StrictlyIdeas.no Fact Sheet for Healthcare Providers: BankingDealers.co.za This test is not yet approved or cleared by the [place] FDA and has been authorized for detection and/or diagnosis of SARS-CoV-2 by FDA under an Emergency Use Authorization (EUA).  This EUA will remain in effect (meaning this test can be used) for the duration of the COVID-19 declaration under Section 564(b)(1) of the Act, 21 U.S.C. section 360bbb-3(b)(1), unless the authorization is terminated or revoked sooner. Performed at [facility], [place] [address], [place], [place] [address]       Radiology Studies: Vas [place] Abi With/wo Tbi  Result Date: 01/27/2019 LOWER EXTREMITY DOPPLER STUDY Indications: Decreased pulses. High Risk Factors: None.  Performing Technologist: [name] RVT  Examination Guidelines: A complete evaluation includes at minimum, Doppler waveform signals and systolic blood pressure reading at the level of bilateral brachial, anterior tibial, and posterior tibial arteries, when vessel segments are accessible. Bilateral testing is considered an integral part of a complete examination. Photoelectric Plethysmograph (PPG) waveforms and toe systolic pressure readings are included as required and additional duplex testing as needed. Limited examinations for reoccurring indications may be performed as noted.  ABI Findings:  +---------+------------------+-----+---------+--------+ Right    Rt Pressure (mmHg)IndexWaveform Comment  +---------+------------------+-----+---------+--------+ Brachial 133                    triphasic         +---------+------------------+-----+---------+--------+ PTA      134               0.89 triphasic         +---------+------------------+-----+---------+--------+ DP       126               0.83 triphasic         +---------+------------------+-----+---------+--------+ Great Toe96                0.64                   +---------+------------------+-----+---------+--------+ +---------+------------------+-----+----------+-------+ Left     Lt Pressure (mmHg)IndexWaveform  Comment +---------+------------------+-----+----------+-------+ Brachial 151                    triphasic         +---------+------------------+-----+----------+-------+ PTA      94                0.62 biphasic          +---------+------------------+-----+----------+-------+ DP       74                0.49 monophasic        +---------+------------------+-----+----------+-------+ Great Toe0                 0.00                   +---------+------------------+-----+----------+-------+ +-------+-----------+-----------+------------+------------+ ABI/TBIToday's ABIToday's TBIPrevious ABIPrevious TBI +-------+-----------+-----------+------------+------------+ Right  0.89       0.64                                +-------+-----------+-----------+------------+------------+  Left   0.62                                           +-------+-----------+-----------+------------+------------+  Summary: Right: Resting right ankle-brachial index indicates mild right lower extremity arterial disease. The right toe-brachial index is abnormal. Left: Resting left ankle-brachial index indicates moderate left lower extremity arterial disease. Unable to obtain TBI due to low amplitude  waveforms.  *See table(s) above for measurements and observations.    Preliminary      Scheduled Meds: . allopurinol  300 mg Oral Daily  . baclofen  10 mg Oral TID  . budesonide (PULMICORT) nebulizer solution  0.25 mg Nebulization BID  . carbidopa-levodopa  1 tablet Oral Daily  . chlorhexidine  15 mL Mouth Rinse BID  . colchicine  0.6 mg Oral Daily  . enoxaparin (LOVENOX) injection  40 mg Subcutaneous Q24H  . furosemide  80 mg Intravenous Q12H  . guaiFENesin  600 mg Oral BID  . ipratropium-albuterol  3 mL Nebulization TID  . latanoprost  1 drop Both Eyes QHS  . levothyroxine  200 mcg Oral Q0600  . mouth rinse  15 mL Mouth Rinse q12n4p  . multivitamin with minerals  1 tablet Oral Daily  . pantoprazole  40 mg Oral QPM  . potassium chloride  40 mEq Oral BID  . simvastatin  40 mg Oral q1800   Continuous Infusions: . sodium chloride 250 mL (01/25/19 2106)     LOS: 10 days    Time spent: 25 minutes spent in the coordination of care today.   Jonnie Finner, DO [facility] Pager [phone]  If 7PM-7AM, please contact night-coverage [email or web address] Password [email or web address] 01/27/2019, 2:57 PM

## 2019-01-27 NOTE — Progress Notes (Addendum)
SATURATION QUALIFICATIONS: (This note is used to comply with regulatory documentation for home oxygen)  Patient Saturations on Room Air at Rest = 87%   Patient saturation on 1 L of oxygen at Rest =93%   Patient was unable to ambulate with therapy today due to weakness and shortness of breath.   Please briefly explain why patient needs home oxygen: Patient qualifies for home oxygen use due to oxygen desaturation on room air at rest.

## 2019-01-27 NOTE — Progress Notes (Signed)
ABI's have been completed. Preliminary results can be found in CV Proc through chart review.   01/27/19 11:25 AM [name] RVT

## 2019-01-27 NOTE — Progress Notes (Addendum)
Progress Note  Patient Name: [name] Date of Encounter: 01/27/2019  Primary Cardiologist: Minus Breeding, MD   Subjective   Feels breathing is at baseline, no chest pain Currenty on RA, O2 sats 88% Can't feel much in his feet.   Inpatient Medications    Scheduled Meds: . allopurinol  300 mg Oral Daily  . baclofen  10 mg Oral TID  . budesonide (PULMICORT) nebulizer solution  0.25 mg Nebulization BID  . carbidopa-levodopa  1 tablet Oral Daily  . chlorhexidine  15 mL Mouth Rinse BID  . colchicine  0.6 mg Oral Daily  . enoxaparin (LOVENOX) injection  40 mg Subcutaneous Q24H  . furosemide  80 mg Intravenous Q12H  . guaiFENesin  600 mg Oral BID  . ipratropium-albuterol  3 mL Nebulization TID  . latanoprost  1 drop Both Eyes QHS  . levothyroxine  200 mcg Oral Q0600  . mouth rinse  15 mL Mouth Rinse q12n4p  . multivitamin with minerals  1 tablet Oral Daily  . pantoprazole  40 mg Oral QPM  . potassium chloride  40 mEq Oral BID  . simvastatin  40 mg Oral q1800   Continuous Infusions: . sodium chloride 250 mL (01/25/19 2106)   PRN Meds: sodium chloride, acetaminophen **OR** acetaminophen, HYDROcodone-acetaminophen, hydrocortisone, nitroGLYCERIN, ondansetron **OR** ondansetron (ZOFRAN) IV, polyethylene glycol, sodium chloride   Vital Signs    Vitals:   01/26/19 2256 01/27/19 0433 01/27/19 0525 01/27/19 0822  BP:  (!) 89/58 104/60   Pulse:  81    Resp: 18 18    Temp:  98 F (36.7 C)    TempSrc:  Axillary    SpO2:   94% 92%  Weight:  87.4 kg    Height:        Intake/Output Summary (Last 24 hours) at 01/27/2019 0102 Last data filed at 01/27/2019 0730 Gross per 24 hour  Intake 1060 ml  Output 2900 ml  Net -1840 ml   Last 3 Weights 01/27/2019 01/26/2019 01/24/2019  Weight (lbs) 192 lb 10.9 oz 190 lb 4.1 oz 190 lb 3.2 oz  Weight (kg) 87.4 kg 86.3 kg 86.274 kg      Telemetry    SR, PVCs occ - Personally Reviewed  ECG   None today - Personally Reviewed   Physical Exam   General: Well developed, well nourished, elderly male in no acute distress Head: Eyes PERRLA Normocephalic and atraumatic Lungs: decreased BS bases Heart: HRRR S1 S2, without rub or gallop. Soft murmur. Upper extremity Pulses are 2+ & equal. Minimal JVD. Lower extremity pulses are decreased, cap refill delayed. L foot >Rl. L foot cool to touch. Abdomen: Bowel sounds are present, abdomen soft and non-tender without masses or  hernias noted. Msk: Normal strength and tone for age. Extremities: No clubbing, cyanosis or edema.    Skin:  No rashes or lesions noted. Neuro: Alert and oriented X 3. Psych:  Good affect, responds appropriately  Labs    High Sensitivity Troponin:  No results for input(s): TROPONINIHS in the last 720 hours.    Cardiac EnzymesNo results for input(s): TROPONINI in the last 168 hours. No results for input(s): TROPIPOC in the last 168 hours.   Chemistry Recent Labs  Lab 01/24/19 0808 01/25/19 0407 01/26/19 0230 01/27/19 0501  NA 144 141 144 140  K 3.4* 3.5 3.5 3.5  CL 97* 97* 97* 97*  CO2 34* 33* 33* 30  GLUCOSE 107* 107* 111* 98  BUN 33* 34* 36* 38*  CREATININE  0.89 0.80 0.83 0.88  CALCIUM 9.0 9.1 9.2 8.9  PROT 6.9  --   --   --   ALBUMIN 3.6 3.7 3.8 3.6  AST 18  --   --   --   ALT 21  --   --   --   ALKPHOS 48  --   --   --   BILITOT 0.2*  --   --   --   GFRNONAA >60 >60 >60 >60  GFRAA >60 >60 >60 >60  ANIONGAP 13 11 14 13      Hematology Recent Labs  Lab 01/25/19 0407 01/26/19 0230 01/27/19 0501  WBC 15.0* 15.2* 19.1*  RBC 4.45 4.51 4.29  HGB 13.0 12.8* 12.4*  HCT 41.0 41.7 40.0  MCV 92.1 92.5 93.2  MCH 29.2 28.4 28.9  MCHC 31.7 30.7 31.0  RDW 15.3 15.3 15.5  PLT 276 307 306    Radiology    No results found.  Cardiac Studies   EF 50 to 55%, left atrial size was moderately dilated with calcification of the aortic valve but no stenosis  Patient Profile     78 y.o. male underlying COPD, continued hypoxia, acute  on chronic diastolic heart failure  Assessment & Plan    Acute on chronic diastolic heart failure in combination with COPD - Lasix titrated up to 80 mg IV BID 08/08 - I/O net - 440 cc 24 hr, net - 15.8 L since admit - wt 212>>192 lbs (up 2 lbs last 24 hr) - O2 sats 88% on RA, may qualify for home O2 after diuresis since sats likely to drop w/ ambulation - with PVCs, will give an extra dose of Kdur today  PAD - decreased pulses L>R feet    - f/u as outpt, but will ck ABIs  For questions or updates, please contact [facility] Please consult [email or web address] for contact info under        Signed, Rosaria Ferries, PA-C  01/27/2019, 9:03 AM    Personally seen and examined. Agree with above.   Feels baseline, no cp  GEN: Well nourished, well developed, in no acute distress  HEENT: normal  Neck: no JVD, carotid bruits, or masses Cardiac: RRR; no murmurs, rubs, or gallops,no edema  Respiratory:  clear to auscultation bilaterally, normal work of breathing GI: soft, nontender, nondistended, + BS MS: no deformity or atrophy  Skin: warm and dry, no rash Neuro:  Alert and Oriented x 3, Strength and sensation are intact Psych: euthymic mood, full affect  Diastolic HF acute, COPD, O2 needs, PAD  - Looks like could go home, improved  - Lasix 40mg  PO QD and increase to BID if weight increases 3 pounds.   - Low salt, fluid restrictions   Discussed with Dr. Marylyn Ishihara.   Will sign off  Candee Furbish, MD

## 2019-01-27 NOTE — Progress Notes (Signed)
Pt states that he will self administer BIPAP QHS when ready for bed.  Pt to notify RT if any assistance is required.  RT to monitor and assess as needed.

## 2019-01-27 NOTE — Progress Notes (Signed)
Occupational Therapy Treatment Patient Details Name: [name] MRN: [id number] DOB: October 09, 1940 Today's Date: 01/27/2019    History of present illness 78 y.o. male with past medical history significant for coronary artery disease, COPD not on home oxygen, diabetes, restless leg syndrome, OSA on CPAP, ACDF C3-6, and admitted for Acute respiratory failure with  hypoxia and hypercapnia secondary to CAP and acute on chronic diastolic heart failure   OT comments  This 78 yo male admitted with above presents to acute OT with making progress with bed mobility, sit<>stand, and transfers. He will continue to benefit from acute OT with follow up [place].  Follow Up Recommendations  Home health OT;Supervision/Assistance - 24 hour    Equipment Recommendations  None recommended by OT       Precautions / Restrictions Precautions Precautions: Fall Precaution Comments: monitor sats (down as low as 87% on RA) Restrictions Weight Bearing Restrictions: No       Mobility Bed Mobility Overal bed mobility: Needs Assistance Bed Mobility: Supine to Sit     Supine to sit: Min assist     General bed mobility comments: Pt able to come 3/4 way up to sit EOB (with HOB, use of rail, momentum) then needed A to just hold positiong while coming the rest of the way up  Transfers Overall transfer level: Needs assistance Equipment used: Rolling walker (2 wheeled) Transfers: Sit to/from [facility] Sit to Stand: Min assist Stand pivot transfers: Mod assist       General transfer comment: Pt stated it normally takes him 3 tries to stand up-he was able to stand with me x2 on first try each time. For the stand pivot he was more shakey in legs so needed Mod A    Balance Overall balance assessment: Needs assistance Sitting-balance support: No upper extremity supported;Feet supported Sitting balance-Leahy Scale: Good     Standing balance support: Bilateral upper extremity supported Standing  balance-Leahy Scale: Poor Standing balance comment: And additonal external support from therapist                           ADL either performed or assessed with clinical judgement   ADL Overall ADL's : Needs assistance/impaired                         Toilet Transfer: Moderate assistance;Stand-pivot Toilet Transfer Details (indicate cue type and reason): bed  to chair with RW to his right                 Vision Patient Visual Report: No change from baseline            Cognition Arousal/Alertness: Awake/alert Behavior During Therapy: WFL for tasks assessed/performed Overall Cognitive Status: Within Functional Limits for tasks assessed                                                     Pertinent Vitals/ Pain       Pain Assessment: No/denies pain         Frequency  Min 2X/week        Progress Toward Goals  OT Goals(current goals can now be found in the care plan section)  Progress towards OT goals: Progressing toward goals     Plan Discharge plan remains  appropriate       AM-PAC OT "6 Clicks" Daily Activity     Outcome Measure   Help from another person eating meals?: A Little Help from another person taking care of personal grooming?: A Little Help from another person toileting, which includes using toliet, bedpan, or urinal?: A Lot Help from another person bathing (including washing, rinsing, drying)?: A Lot Help from another person to put on and taking off regular upper body clothing?: A Little Help from another person to put on and taking off regular lower body clothing?: Total 6 Click Score: 14    End of Session Equipment Utilized During Treatment: Rolling walker  OT Visit Diagnosis: Unsteadiness on feet (R26.81);Other abnormalities of gait and mobility (R26.89)   Activity Tolerance Patient tolerated treatment well   Patient Left in chair;with call bell/phone within reach;with family/visitor  present   Nurse Communication          Time: 2336-1224 OT Time Calculation (min): 21 min  Charges: OT General Charges $OT Visit: 1 Visit OT Treatments $Self Care/Home Management : 8-22 mins  [address], OTR/L Acute [employer] Pager [phone] Office [phone]      Almon Register 01/27/2019, 12:41 PM

## 2019-01-27 NOTE — Care Management Important Message (Signed)
Important Message  Patient Details  Name: [name] MRN: [id number] Date of Birth: 1941-04-02   Medicare Important Message Given:  Yes. CMA printed out IM for the Case Management Nurse or CSW to give to the patient.      Satsuki Zillmer 01/27/2019, 11:38 AM

## 2019-01-28 LAB — CBC WITH DIFFERENTIAL/PLATELET
Abs Immature Granulocytes: 0.13 10*3/uL — ABNORMAL HIGH (ref 0.00–0.07)
Basophils Absolute: 0.1 10*3/uL (ref 0.0–0.1)
Basophils Relative: 1 %
Eosinophils Absolute: 0.3 10*3/uL (ref 0.0–0.5)
Eosinophils Relative: 2 %
HCT: 42.2 % (ref 39.0–52.0)
Hemoglobin: 12.9 g/dL — ABNORMAL LOW (ref 13.0–17.0)
Immature Granulocytes: 1 %
Lymphocytes Relative: 13 %
Lymphs Abs: 1.9 10*3/uL (ref 0.7–4.0)
MCH: 28.5 pg (ref 26.0–34.0)
MCHC: 30.6 g/dL (ref 30.0–36.0)
MCV: 93.2 fL (ref 80.0–100.0)
Monocytes Absolute: 0.8 10*3/uL (ref 0.1–1.0)
Monocytes Relative: 6 %
Neutro Abs: 11.9 10*3/uL — ABNORMAL HIGH (ref 1.7–7.7)
Neutrophils Relative %: 77 %
Platelets: 281 10*3/uL (ref 150–400)
RBC: 4.53 MIL/uL (ref 4.22–5.81)
RDW: 15.5 % (ref 11.5–15.5)
WBC: 15.2 10*3/uL — ABNORMAL HIGH (ref 4.0–10.5)
nRBC: 0 % (ref 0.0–0.2)

## 2019-01-28 LAB — RENAL FUNCTION PANEL
Albumin: 3.7 g/dL (ref 3.5–5.0)
Anion gap: 12 (ref 5–15)
BUN: 42 mg/dL — ABNORMAL HIGH (ref 8–23)
CO2: 30 mmol/L (ref 22–32)
Calcium: 9.1 mg/dL (ref 8.9–10.3)
Chloride: 98 mmol/L (ref 98–111)
Creatinine, Ser: 0.87 mg/dL (ref 0.61–1.24)
GFR calc Af Amer: >60 mL/min (ref >60)
GFR calc non Af Amer: >60 mL/min (ref >60)
Glucose, Bld: 114 mg/dL — ABNORMAL HIGH (ref 70–99)
Phosphorus: 4.5 mg/dL (ref 2.5–4.6)
Potassium: 4 mmol/L (ref 3.5–5.1)
Sodium: 140 mmol/L (ref 135–145)

## 2019-01-28 LAB — MAGNESIUM: Magnesium: 2.7 mg/dL — ABNORMAL HIGH (ref 1.7–2.4)

## 2019-01-28 MED ORDER — FUROSEMIDE 40 MG PO TABS: 40.0000 mg | ORAL_TABLET | Freq: Every day | ORAL | 2 refills | Status: DC

## 2019-01-28 MED ORDER — METOPROLOL SUCCINATE ER 50 MG PO TB24: 50.0000 mg | ORAL_TABLET | Freq: Every day | ORAL | 0 refills | Status: DC

## 2019-01-28 NOTE — TOC Progression Note (Signed)
Transition of Care [facility]) - Progression Note    Patient Details  Name: CORTEZ FLIPPEN MRN: [id number] Date of Birth: 1940/10/27  [facility]) CM/SW Contact  Purcell Mouton, RN Phone Number: 01/28/2019, 12:23 PM  Clinical Narrative:     [facility] will service pt with HHPT, referral given to [facility] in house rep. Huey Romans will service Home O2, referral given to [facility].        Expected Discharge Plan and Services           Expected Discharge Date: 01/28/19                                     Social Determinants of Health (SDOH) Interventions    Readmission Risk Interventions No flowsheet data found.

## 2019-01-28 NOTE — Discharge Summary (Signed)
. Physician Discharge Summary  [name] AYT:[id number] DOB: 09-06-1940 DOA: 01/17/2019  PCP: [name], MD  Admit date: 01/17/2019 Discharge date: 01/28/2019  Admitted From: Home Disposition:  Discharged to home with [facility].  Recommendations for Outpatient Follow-up:  1. Follow up with PCP in 1-2 weeks 2. Please obtain BMP/CBC in one week 3. Please follow up on the following pending results:  Home Health: Yes  Equipment/Devices: Oxygen   Discharge Condition: Stable  CODE STATUS: DNR   Brief/Interim Summary: [name] a 78 y.o.malewith past medical history significant for coronary artery disease, COPD not on home oxygen, diabetes, restless leg syndrome, OSA on CPAP, who presents complaining of worsening cough and shortness of breath for the last 2 days prior to admission. On arrival he was febrile, and CXR shows right sided pneumonia.  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  - ABG shows respiratory acidosis due to hypercapnia and hypoxia.  - BIPAP as needed. Repeat  - ABG shows PH of 7.34, but persistent elevation of PCO2. - IV rocephin and zithromax; endzithro8/11/2018.  - Tapering steroids - Bld Cx NTD - White count down to 12, afebrile ON - CT of the chest without contrast showed bilateral layering pleural effusions and right mid lobe consolidation.  - PCCM consulted; appreciate assistance - had to go back on BiPap last night d/t difficulty maintaining SpO2 on 15L. He tells me that he uses CPap at  night, so we can have him on the equivalent while sleeping; currently on 5-6L [place] during my interveiw. - he's not moving much more on his O2 use right now; abx tx finishes today; he does look good enough to TTF; will ask cards to see for AoC dHF, if there are no new drips added, will TTF to med-tele - outside of continued lasix; only thing left to address would be pleural effusion. Will rpt CXR; speak with IR for tap - taken to IR for thoracentesis, unable to safely perform per IR ; see note for details; ok to TTF-telemetry -now onlasix80mg  IV BID - down to 1L [place]; get 6MWT, may need to go home on O2 at this point     - now on RA to 1L [place]; will need O2 at discharge, CM consulted  Sepsis possibly from proteus UTI and community acquired pneumonia.  - Fever resolved, blood cultures have been negative.  - Urine cultures show proteus, sensitive to rocephin - completed rocephin regimen  CAP - see above  UTI - see above  Acute on chronic diastolic heart failure: - Improving - Continue with IV lasix 40 mg BID.  - Continue with strict intake and output.  - Creatine appears to be at baseline with IV diuresis.  - Replace potassium. - have consulted cardiology - now on IV lasix80mg  BID - appreciate cards assistance     - decrease to 40mg  PO qday     - let's say 88kg is his dry weight     - per cardiology: - Looks like could go home, improved            -  Lasix 40mg  PO QD and increase to BID if weight increases 3 pounds.             - Low salt, fluid restrictions     - spoke with wife/pt; set dry weight at 188lbs  COPD:  - Resume duonebs. Pt not wheezing at this time. - pulmicort, duobnebs, mucinex - down to 1L [place]; get 6MWT     - resting O2 on RA is 87%; will order O2 for home     - resume trelegy ellipta at discharge, follow up with PCP/Pulmonology  Gout:  - Continue with colchicine and  allopurinol.   Mild anemia of chronic disease:  -no evidence of bleed, monitor  Hypertension:  - BP acceptable  - lasix 40mg  PO; can resume metoprolol at discharge; hold HCTZ, losartan, clonidine until PCP follow up  Hypokalemia:  - replete, monitor     - resolved  Debility/Generalized weakness - 2-3 person max assist to transfer from bed to chair - lives w/ wife at home; doubtful she has the ability on own to assist him around house - he does note that he works w/ walker and wheelchair at home     - refuses SNF rehab:  "it's a lie"     - wife says that they have help in the home daily; will get him HH and O2 as well  Discharge plan explained in detail to pt/family. They voice understanding. [place] for discharge to home w/ HH.  Discharge Instructions   Allergies as of 01/28/2019   No Known Allergies     Medication List    STOP taking these medications   cloNIDine 0.1 MG tablet Commonly known as: CATAPRES   losartan-hydrochlorothiazide 100-25 MG tablet Commonly known as: HYZAAR     TAKE these medications   acetaminophen 325 MG tablet Commonly known as: TYLENOL Take 2 tablets (650 mg total) by mouth every 4 (four) hours as needed for mild pain ((score 1 to 3) or temp > 100.5).   allopurinol 300 MG tablet Commonly known as: ZYLOPRIM Take 300 mg by mouth daily.   baclofen 10 MG tablet Commonly known as: LIORESAL Take 1-1.5 tablets (10-15 mg total) by mouth 3 (three) times daily. What changed: how much to take   calcium citrate-vitamin D 315-200 MG-UNIT tablet Commonly known as: CITRACAL+D Take 1 tablet by mouth daily. 750 mg   carbidopa-levodopa 10-100 MG tablet Commonly known as: SINEMET IR Take 1 tablet by mouth 2 (two) times daily. What changed: when to take this   cholecalciferol 25 MCG (1000 UT) tablet Commonly known as: VITAMIN D Take 1,000 Units by mouth daily.   colchicine 0.6 MG tablet Take 0.6 mg by mouth daily.    furosemide 40 MG tablet Commonly known as: LASIX Take 1 tablet (40 mg total) by mouth daily. Start taking on: January 29, 2019   guaifenesin 400 MG Tabs tablet Commonly known as: HUMIBID E Take 400 mg by mouth 2 (two) times daily as needed (congestion).   HYDROcodone-acetaminophen 5-325 MG tablet Commonly known as: NORCO/VICODIN Take 1 tablet by mouth 3 (three) times daily as needed (pain). What changed:   when to take this  reasons to take this   ipratropium-albuterol 0.5-2.5 (3) MG/3ML Soln Commonly known as: DUONEB Take 3 mLs by nebulization every 6 (six) hours as needed (cough).   levothyroxine 200 MCG tablet Commonly known as: SYNTHROID Take 200 mcg by mouth daily before breakfast.   metFORMIN 500 MG tablet Commonly known as: GLUCOPHAGE Take  1 tablet (500 mg total) by mouth 2 (two) times daily with a meal.   metoprolol succinate 50 MG 24 hr tablet Commonly known as: TOPROL-XL Take 1 tablet (50 mg total) by mouth daily. Take with or immediately following a meal. What changed: when to take this   multivitamin tablet Take 1 tablet by mouth daily.   nitroGLYCERIN 0.4 MG SL tablet Commonly known as: NITROSTAT Place 0.4 mg every 5 (five) minutes as needed under the tongue for chest pain.   oxymetazoline 0.05 % nasal spray Commonly known as: AFRIN Place 2 sprays into the nose 2 (two) times daily as needed for congestion.   pantoprazole 40 MG tablet Commonly known as: PROTONIX Take 40 mg by mouth every evening.   polyethylene glycol 17 g packet Commonly known as: MIRALAX / GLYCOLAX Take 17 g by mouth daily as needed for moderate constipation. What changed: when to take this   rizatriptan 10 MG tablet Commonly known as: MAXALT Take 10 mg by mouth as needed for migraine.   simvastatin 40 MG tablet Commonly known as: ZOCOR Take 40 mg by mouth daily at 6 PM.   Travatan Z 0.004 % Soln ophthalmic solution Generic drug: Travoprost (BAK Free) Place 1 drop into  both eyes at bedtime.   Trelegy Ellipta 100-62.5-25 MCG/INH Aepb Generic drug: Fluticasone-Umeclidin-Vilant Inhale 1 puff into the lungs daily.   Ventolin HFA 108 (90 Base) MCG/ACT inhaler Generic drug: albuterol Inhale 1-2 puffs into the lungs every 4 (four) hours as needed for wheezing or shortness of breath.            Durable Medical Equipment  (From admission, onward)         Start     Ordered   01/27/19 1407  For home use only DME oxygen  Once    Comments: Dx: COPD; diastolic HF  Question Answer Comment  Length of Need 12 Months   Mode or (Route) Nasal cannula   Liters per Minute 2   Frequency Continuous (stationary and portable oxygen unit needed)   Oxygen conserving device Yes   Oxygen delivery system Gas      01/27/19 1409          No Known Allergies  Consultations:  PCCM  Cardiology   Procedures/Studies: Ct Chest Wo Contrast  Result Date: 01/20/2019 CLINICAL DATA:  78 year old male with unresolved pneumonia. Negative for COVID-19. On 01/17/2019. EXAM: CT CHEST WITHOUT CONTRAST TECHNIQUE: Multidetector CT imaging of the chest was performed following the standard protocol without IV contrast. COMPARISON:  Chest CT 08/18/2017. Portable chest today and earlier. FINDINGS: Cardiovascular: Calcified coronary artery atherosclerosis and/or stents (series 2, image 78). Stable cardiac size at the upper limits of normal. No pericardial effusion. Mild Calcified aortic atherosclerosis. Vascular patency is not evaluated in the absence of IV contrast. Mediastinum/Nodes: Thyroid appears to be surgically absent. Reactive appearing mediastinal lymph nodes measure up to 12 millimeter short axis (all subcentimeter in 2019). Lungs/Pleura: Layering bilateral pleural effusions, moderate on the left and small to moderate on the right. The major airways are patent. Near complete atelectasis or consolidation of the left lower lobe, with some air bronchograms. Chronic elevation of the  left hemidiaphragm is similar. Right lower lobe basal segment atelectasis or consolidation, with air bronchograms. Superimposed dependent opacity in both upper lobes more resembles atelectasis, but there is also scattered peribronchial ground-glass opacity in the right upper lobe (series 7, image 47), right middle lobe (image 90) and along the right minor fissure which appears  inflammatory. The left upper lobe and lingula appear spared. Upper Abdomen: Negative visible noncontrast liver, spleen, pancreas, adrenal glands and left kidney. Negative visible bowel. Right upper pole nephrolithiasis or renal vascular calcifications on series 2, image 147. Musculoskeletal: Partially visible cervical ACDF. No acute osseous abnormality identified. IMPRESSION: 1. Up to moderate bilateral layering pleural effusions with bilateral lower lobe confluent atelectasis versus infectious consolidation. 2. Superimposed right upper and middle middle lobe peribronchial ground-glass opacity which is compatible with bronchopneumonia. 3. Reactive appearing mediastinal lymph nodes. 4. Calcified coronary artery atherosclerosis. Mild cardiomegaly, no pericardial effusion. 5. Right nephrolithiasis or renal vascular calcifications. Electronically Signed   By: Genevie Ann M.D.   On: 01/20/2019 15:20   [place] Chest (pleural Effusion)  Result Date: 01/24/2019 CLINICAL DATA:  78 year old male referred for possible thoracentesis EXAM: CHEST ULTRASOUND COMPARISON:  None. FINDINGS: Ultrasound of the chest reveals scant pleural fluid bilaterally. Thoracentesis not performed. IMPRESSION: Scant pleural fluid bilaterally. Electronically Signed   By: Corrie Mckusick D.O.   On: 01/24/2019 14:10   Dg Chest Port 1 View  Result Date: 01/23/2019 CLINICAL DATA:  Worsening cough and shortness of breath EXAM: PORTABLE CHEST 1 VIEW COMPARISON:  01/21/2019, CT 01/20/2019 FINDINGS: Moderate bilateral pleural effusions, similar compared to prior radiograph. Continued  bibasilar consolidations. Enlarged cardiomediastinal silhouette with central vascular congestion. No pneumothorax. IMPRESSION: No significant change in moderate bilateral pleural effusions and bibasilar airspace disease. Cardiomegaly. Electronically Signed   By: Donavan Foil M.D.   On: 01/23/2019 15:38   Dg Chest Port 1 View  Result Date: 01/21/2019 CLINICAL DATA:  Hypertension.  Shortness of breath. EXAM: PORTABLE CHEST 1 VIEW COMPARISON:  January 20, 2019 FINDINGS: There is cardiomegaly with moderate-sized bilateral pleural effusions and similar bibasilar airspace opacities. No pneumothorax. There is generalized volume overload. There is no acute osseous abnormality. IMPRESSION: Stable appearance of the chest as detailed above. Electronically Signed   By: Constance Holster M.D.   On: 01/21/2019 14:14   Dg Chest Port 1 View  Result Date: 01/20/2019 CLINICAL DATA:  Follow-up, ventilation EXAM: PORTABLE CHEST 1 VIEW COMPARISON:  01/18/2019 FINDINGS: Unchanged AP portable examination with layering bilateral pleural effusions, interstitial pulmonary opacity throughout with heterogeneous airspace opacity of the right midlung and lung base, and mild cardiomegaly. There is no endotracheal tube or other ventilation apparatus in the chest appreciated per stated indication. Surgical clips about the neck likely related to thyroidectomy. IMPRESSION: Unchanged AP portable examination with layering bilateral pleural effusions, interstitial pulmonary opacity throughout with heterogeneous airspace opacity of the right midlung and lung base, and mild cardiomegaly. There is no endotracheal tube or other ventilation apparatus appreciated in the chest per stated indication. Electronically Signed   By: Eddie Candle M.D.   On: 01/20/2019 09:14   Dg Chest Port 1 View  Result Date: 01/18/2019 CLINICAL DATA:  Hypoxia, shortness of breath EXAM: PORTABLE CHEST 1 VIEW COMPARISON:  January 17, 2019 FINDINGS: The heart size and  mediastinal contours are stable. The heart size is enlarged. Patchy consolidation of the right mid lung and right lower lung zones are identified. There is mild patchy opacity of the medial left lung base. Increased central pulmonary vessel caliber are noted. The visualized skeletal structures are stable. IMPRESSION: Central pulmonary vascular congestion. Right lung pneumonia slightly worse compared prior exam. Patchy opacity of the medial left lung base, developing pneumonia is not excluded. Electronically Signed   By: Abelardo Diesel M.D.   On: 01/18/2019 15:40   Dg Chest [facility]  Result Date: 01/17/2019 CLINICAL DATA:  Shortness of breath and congestion EXAM: PORTABLE CHEST 1 VIEW COMPARISON:  PA and lateral chest 08/19/2017.  CT chest 08/18/2017. FINDINGS: Patchy airspace disease is seen in the right mid and lower lung zones. Left lung is clear. Heart size is normal. No pneumothorax or pleural effusion. No acute or focal bony abnormality. IMPRESSION: Patchy airspace disease in the right mid and lower lung zones most consistent with pneumonia. Electronically Signed   By: Inge Rise M.D.   On: 01/17/2019 10:57   Vas [place] [facility] Bunting With/wo Tbi  Result Date: 01/27/2019 LOWER EXTREMITY DOPPLER STUDY Indications: Decreased pulses. High Risk Factors: None.  Performing Technologist: [name] RVT  Examination Guidelines: A complete evaluation includes at minimum, Doppler waveform signals and systolic blood pressure reading at the level of bilateral brachial, anterior tibial, and posterior tibial arteries, when vessel segments are accessible. Bilateral testing is considered an integral part of a complete examination. Photoelectric Plethysmograph (PPG) waveforms and toe systolic pressure readings are included as required and additional duplex testing as needed. Limited examinations for reoccurring indications may be performed as noted.  ABI Findings: +---------+------------------+-----+---------+--------+  Right    Rt Pressure (mmHg)IndexWaveform Comment  +---------+------------------+-----+---------+--------+ Brachial 133                    triphasic         +---------+------------------+-----+---------+--------+ PTA      134               0.89 triphasic         +---------+------------------+-----+---------+--------+ DP       126               0.83 triphasic         +---------+------------------+-----+---------+--------+ Great Toe96                0.64                   +---------+------------------+-----+---------+--------+ +---------+------------------+-----+----------+-------+ Left     Lt Pressure (mmHg)IndexWaveform  Comment +---------+------------------+-----+----------+-------+ Brachial 151                    triphasic         +---------+------------------+-----+----------+-------+ PTA      94                0.62 biphasic          +---------+------------------+-----+----------+-------+ DP       74                0.49 monophasic        +---------+------------------+-----+----------+-------+ Great Toe0                 0.00                   +---------+------------------+-----+----------+-------+ +-------+-----------+-----------+------------+------------+ ABI/TBIToday's ABIToday's TBIPrevious ABIPrevious TBI +-------+-----------+-----------+------------+------------+ Right  0.89       0.64                                +-------+-----------+-----------+------------+------------+ Left   0.62                                           +-------+-----------+-----------+------------+------------+  Summary: Right: Resting right ankle-brachial index indicates mild right lower extremity arterial disease. The right  toe-brachial index is abnormal. Left: Resting left ankle-brachial index indicates moderate left lower extremity arterial disease. Unable to obtain TBI due to low amplitude waveforms.  *See table(s) above for measurements and  observations.    Preliminary       Subjective: "I don't know what else to ask."  Discharge Exam: Vitals:   01/27/19 2039 01/28/19 0446  BP: 113/60 (!) 155/73  Pulse: 80 79  Resp: 18 18  Temp: 98 F (36.7 C) (!) 97.5 F (36.4 C)  SpO2: 95% 93%   Vitals:   01/27/19 2003 01/27/19 2017 01/27/19 2039 01/28/19 0446  BP:   113/60 (!) 155/73  Pulse:  83 80 79  Resp:  18 18 18   Temp:   98 F (36.7 C) (!) 97.5 F (36.4 C)  TempSrc:   Oral Axillary  SpO2: 95% 95% 95% 93%  Weight:    84.5 kg  Height:        General:77 y.o.maleresting inchairin NAD Eyes: PERRL, normal sclera ENMT: Nares patent w/o discharge, orophaynx clear, dentition normal, ears w/o discharge/lesions/ulcers Cardiovascular: RRR, +S1, S2, no m/g/r, equal pulses throughout Respiratory:CTBAL, no w/r/r GI: BS+, NDNT, no masses noted, no organomegaly noted MSK: No e/c/c Skin: No rashes, bruises, ulcerations noted Neuro:alert to name, follows commands Psyc: Appropriate interaction and affect, calm/cooperative    The results of significant diagnostics from this hospitalization (including imaging, microbiology, ancillary and laboratory) are listed below for reference.     Microbiology: Recent Results (from the past 240 hour(s))  SARS Coronavirus 2 [facility] order, Performed in [place] hospital lab)     Status: None   Collection Time: 01/24/19  8:19 AM  Result Value Ref Range Status   SARS Coronavirus 2 NEGATIVE NEGATIVE Final    Comment: (NOTE) If result is NEGATIVE SARS-CoV-2 target nucleic acids are NOT DETECTED. The SARS-CoV-2 RNA is generally detectable in upper and lower  respiratory specimens during the acute phase of infection. The lowest  concentration of SARS-CoV-2 viral copies this assay can detect is 250  copies / mL. A negative result does not preclude SARS-CoV-2 infection  and should not be used as the sole basis for treatment or other  patient management decisions.  A negative  result may occur with  improper specimen collection / handling, submission of specimen other  than nasopharyngeal swab, presence of viral mutation(s) within the  areas targeted by this assay, and inadequate number of viral copies  (<250 copies / mL). A negative result must be combined with clinical  observations, patient history, and epidemiological information. If result is POSITIVE SARS-CoV-2 target nucleic acids are DETECTED. The SARS-CoV-2 RNA is generally detectable in upper and lower  respiratory specimens dur ing the acute phase of infection.  Positive  results are indicative of active infection with SARS-CoV-2.  Clinical  correlation with patient history and other diagnostic information is  necessary to determine patient infection status.  Positive results do  not rule out bacterial infection or co-infection with other viruses. If result is PRESUMPTIVE POSTIVE SARS-CoV-2 nucleic acids MAY BE PRESENT.   A presumptive positive result was obtained on the submitted specimen  and confirmed on repeat testing.  While 2019 novel coronavirus  (SARS-CoV-2) nucleic acids may be present in the submitted sample  additional confirmatory testing may be necessary for epidemiological  and / or clinical management purposes  to differentiate between  SARS-CoV-2 and other Sarbecovirus currently known to infect humans.  If clinically indicated additional testing with an alternate test  methodology [phone])  is advised. The SARS-CoV-2 RNA is generally  detectable in upper and lower respiratory sp ecimens during the acute  phase of infection. The expected result is Negative. Fact Sheet for Patients:  StrictlyIdeas.no Fact Sheet for Healthcare Providers: BankingDealers.co.za This test is not yet approved or cleared by the [place] FDA and has been authorized for detection and/or diagnosis of SARS-CoV-2 by FDA under an Emergency Use Authorization  (EUA).  This EUA will remain in effect (meaning this test can be used) for the duration of the COVID-19 declaration under Section 564(b)(1) of the Act, 21 U.S.C. section 360bbb-3(b)(1), unless the authorization is terminated or revoked sooner. Performed at [facility], [place] [address] [place], [place] [address]      Labs: BNP (last 3 results) No results for input(s): BNP in the last 8760 hours. Basic Metabolic Panel: Recent Labs  Lab 01/23/19 0210  01/24/19 0808 01/25/19 0407 01/26/19 0230 01/27/19 0501 01/28/19 0355  NA 143  --  144 141 144 140 140  K 3.1*  --  3.4* 3.5 3.5 3.5 4.0  CL 92*  --  97* 97* 97* 97* 98  CO2 38*  --  34* 33* 33* 30 30  GLUCOSE 103*  --  107* 107* 111* 98 114*  BUN 31*  --  33* 34* 36* 38* 42*  CREATININE 0.92   < > 0.89 0.80 0.83 0.88 0.87  CALCIUM 8.9  --  9.0 9.1 9.2 8.9 9.1  MG 2.2  --  2.4 2.4 2.7* 2.5* 2.7*  PHOS 3.2  --   --  4.5 4.5 3.8 4.5   < > = values in this interval not displayed.   Liver Function Tests: Recent Labs  Lab 01/24/19 0808 01/25/19 0407 01/26/19 0230 01/27/19 0501 01/28/19 0355  AST 18  --   --   --   --   ALT 21  --   --   --   --   ALKPHOS 48  --   --   --   --   BILITOT 0.2*  --   --   --   --   PROT 6.9  --   --   --   --   ALBUMIN 3.6 3.7 3.8 3.6 3.7   No results for input(s): LIPASE, AMYLASE in the last 168 hours. No results for input(s): AMMONIA in the last 168 hours. CBC: Recent Labs  Lab 01/24/19 0808 01/25/19 0407 01/26/19 0230 01/27/19 0501 01/28/19 0355  WBC 12.6* 15.0* 15.2* 19.1* 15.2*  NEUTROABS 9.3* 10.7* 11.3* 15.4* 11.9*  HGB 12.7* 13.0 12.8* 12.4* 12.9*  HCT 42.1 41.0 41.7 40.0 42.2  MCV 93.3 92.1 92.5 93.2 93.2  PLT 271 276 307 306 281   Cardiac Enzymes: No results for input(s): CKTOTAL, CKMB, CKMBINDEX, TROPONINI in the last 168 hours. BNP: Invalid input(s): POCBNP CBG: No results for input(s): GLUCAP in the last 168 hours. D-Dimer No results for  input(s): DDIMER in the last 72 hours. Hgb A1c No results for input(s): HGBA1C in the last 72 hours. Lipid Profile No results for input(s): CHOL, HDL, LDLCALC, TRIG, CHOLHDL, LDLDIRECT in the last 72 hours. Thyroid function studies No results for input(s): TSH, T4TOTAL, T3FREE, THYROIDAB in the last 72 hours.  Invalid input(s): FREET3 Anemia work up No results for input(s): VITAMINB12, FOLATE, FERRITIN, TIBC, IRON, RETICCTPCT in the last 72 hours. Urinalysis    Component Value Date/Time   COLORURINE YELLOW 01/17/2019 1234   APPEARANCEUR TURBID (A) 01/17/2019 1234  LABSPEC 1.025 01/17/2019 1234   PHURINE 8.0 01/17/2019 1234   GLUCOSEU NEGATIVE 01/17/2019 1234   HGBUR NEGATIVE 01/17/2019 1234   BILIRUBINUR SMALL (A) 01/17/2019 1234   KETONESUR NEGATIVE 01/17/2019 1234   PROTEINUR 100 (A) 01/17/2019 1234   UROBILINOGEN 1.0 09/09/2010 1045   NITRITE POSITIVE (A) 01/17/2019 1234   LEUKOCYTESUR LARGE (A) 01/17/2019 1234   Sepsis Labs Invalid input(s): PROCALCITONIN,  WBC,  LACTICIDVEN Microbiology Recent Results (from the past 240 hour(s))  SARS Coronavirus 2 [facility] order, Performed in [place] hospital lab)     Status: None   Collection Time: 01/24/19  8:19 AM  Result Value Ref Range Status   SARS Coronavirus 2 NEGATIVE NEGATIVE Final    Comment: (NOTE) If result is NEGATIVE SARS-CoV-2 target nucleic acids are NOT DETECTED. The SARS-CoV-2 RNA is generally detectable in upper and lower  respiratory specimens during the acute phase of infection. The lowest  concentration of SARS-CoV-2 viral copies this assay can detect is 250  copies / mL. A negative result does not preclude SARS-CoV-2 infection  and should not be used as the sole basis for treatment or other  patient management decisions.  A negative result may occur with  improper specimen collection / handling, submission of specimen other  than nasopharyngeal swab, presence of viral mutation(s) within the  areas  targeted by this assay, and inadequate number of viral copies  (<250 copies / mL). A negative result must be combined with clinical  observations, patient history, and epidemiological information. If result is POSITIVE SARS-CoV-2 target nucleic acids are DETECTED. The SARS-CoV-2 RNA is generally detectable in upper and lower  respiratory specimens dur ing the acute phase of infection.  Positive  results are indicative of active infection with SARS-CoV-2.  Clinical  correlation with patient history and other diagnostic information is  necessary to determine patient infection status.  Positive results do  not rule out bacterial infection or co-infection with other viruses. If result is PRESUMPTIVE POSTIVE SARS-CoV-2 nucleic acids MAY BE PRESENT.   A presumptive positive result was obtained on the submitted specimen  and confirmed on repeat testing.  While 2019 novel coronavirus  (SARS-CoV-2) nucleic acids may be present in the submitted sample  additional confirmatory testing may be necessary for epidemiological  and / or clinical management purposes  to differentiate between  SARS-CoV-2 and other Sarbecovirus currently known to infect humans.  If clinically indicated additional testing with an alternate test  methodology [phone]) is advised. The SARS-CoV-2 RNA is generally  detectable in upper and lower respiratory sp ecimens during the acute  phase of infection. The expected result is Negative. Fact Sheet for Patients:  StrictlyIdeas.no Fact Sheet for Healthcare Providers: BankingDealers.co.za This test is not yet approved or cleared by the [place] FDA and has been authorized for detection and/or diagnosis of SARS-CoV-2 by FDA under an Emergency Use Authorization (EUA).  This EUA will remain in effect (meaning this test can be used) for the duration of the COVID-19 declaration under Section 564(b)(1) of the Act, 21 U.S.C. section  360bbb-3(b)(1), unless the authorization is terminated or revoked sooner. Performed at [facility], [place] [address], [place], [place] [address]      Time coordinating discharge: 35 minutes  SIGNED:   Jonnie Finner, DO  [facility] 01/28/2019, 7:29 AM Pager   If 7PM-7AM, please contact night-coverage [email or web address] Password [email or web address]

## 2019-01-28 NOTE — Plan of Care (Signed)
Pt's WBC count has gone from 15 on 8/8 to 19.1 on 8/10, and his BUN has increased from 34 on 8/8 to 38 on 8/10.

## 2019-02-13 ENCOUNTER — Encounter: Payer: Self-pay | Admitting: Cardiology

## 2019-02-13 DIAGNOSIS — I10 Essential (primary) hypertension: Secondary | ICD-10-CM | POA: Insufficient documentation

## 2019-02-13 DIAGNOSIS — I152 Hypertension secondary to endocrine disorders: Secondary | ICD-10-CM | POA: Insufficient documentation

## 2019-02-13 DIAGNOSIS — E119 Type 2 diabetes mellitus without complications: Secondary | ICD-10-CM | POA: Insufficient documentation

## 2019-02-13 DIAGNOSIS — E1159 Type 2 diabetes mellitus with other circulatory complications: Secondary | ICD-10-CM | POA: Insufficient documentation

## 2019-02-14 ENCOUNTER — Encounter: Payer: Self-pay | Admitting: Cardiology

## 2019-02-14 ENCOUNTER — Ambulatory Visit (INDEPENDENT_AMBULATORY_CARE_PROVIDER_SITE_OTHER): Payer: Medicare Other | Admitting: Cardiology

## 2019-02-14 ENCOUNTER — Telehealth: Payer: Self-pay | Admitting: Cardiology

## 2019-02-14 ENCOUNTER — Other Ambulatory Visit: Payer: Self-pay

## 2019-02-14 VITALS — BP 106/69 | HR 93 | Temp 97.9°F | Ht 67.0 in | Wt 198.0 lb

## 2019-02-14 DIAGNOSIS — J181 Lobar pneumonia, unspecified organism: Secondary | ICD-10-CM

## 2019-02-14 DIAGNOSIS — G4733 Obstructive sleep apnea (adult) (pediatric): Secondary | ICD-10-CM | POA: Diagnosis not present

## 2019-02-14 DIAGNOSIS — M4802 Spinal stenosis, cervical region: Secondary | ICD-10-CM

## 2019-02-14 DIAGNOSIS — G992 Myelopathy in diseases classified elsewhere: Secondary | ICD-10-CM

## 2019-02-14 DIAGNOSIS — I10 Essential (primary) hypertension: Secondary | ICD-10-CM

## 2019-02-14 DIAGNOSIS — E119 Type 2 diabetes mellitus without complications: Secondary | ICD-10-CM | POA: Diagnosis not present

## 2019-02-14 DIAGNOSIS — J189 Pneumonia, unspecified organism: Secondary | ICD-10-CM

## 2019-02-14 DIAGNOSIS — J9601 Acute respiratory failure with hypoxia: Secondary | ICD-10-CM | POA: Diagnosis not present

## 2019-02-14 DIAGNOSIS — I739 Peripheral vascular disease, unspecified: Secondary | ICD-10-CM

## 2019-02-14 DIAGNOSIS — Z9989 Dependence on other enabling machines and devices: Secondary | ICD-10-CM

## 2019-02-14 LAB — CBC WITH DIFFERENTIAL/PLATELET
Basophils Absolute: 0 10*3/uL (ref 0.0–0.2)
Basos: 1 %
EOS (ABSOLUTE): 0.4 10*3/uL (ref 0.0–0.4)
Eos: 5 %
Hematocrit: 37.7 % (ref 37.5–51.0)
Hemoglobin: 12.5 g/dL — ABNORMAL LOW (ref 13.0–17.7)
Immature Grans (Abs): 0 10*3/uL (ref 0.0–0.1)
Immature Granulocytes: 0 %
Lymphocytes Absolute: 1.1 10*3/uL (ref 0.7–3.1)
Lymphs: 14 %
MCH: 28.8 pg (ref 26.6–33.0)
MCHC: 33.2 g/dL (ref 31.5–35.7)
MCV: 87 fL (ref 79–97)
Monocytes Absolute: 0.7 10*3/uL (ref 0.1–0.9)
Monocytes: 9 %
Neutrophils Absolute: 5.5 10*3/uL (ref 1.4–7.0)
Neutrophils: 71 %
Platelets: 190 10*3/uL (ref 150–450)
RBC: 4.34 x10E6/uL (ref 4.14–5.80)
RDW: 14.5 % (ref 11.6–15.4)
WBC: 7.7 10*3/uL (ref 3.4–10.8)

## 2019-02-14 LAB — BASIC METABOLIC PANEL
BUN/Creatinine Ratio: 17 (ref 10–24)
BUN: 18 mg/dL (ref 8–27)
CO2: 31 mmol/L — ABNORMAL HIGH (ref 20–29)
Calcium: 9.8 mg/dL (ref 8.6–10.2)
Chloride: 96 mmol/L (ref 96–106)
Creatinine, Ser: 1.09 mg/dL (ref 0.76–1.27)
GFR calc Af Amer: 75 mL/min/{1.73_m2} (ref >59)
GFR calc non Af Amer: 65 mL/min/{1.73_m2} (ref >59)
Glucose: 124 mg/dL — ABNORMAL HIGH (ref 65–99)
Potassium: 3.8 mmol/L (ref 3.5–5.2)
Sodium: 146 mmol/L — ABNORMAL HIGH (ref 134–144)

## 2019-02-14 LAB — PRO B NATRIURETIC PEPTIDE: NT-Pro BNP: 482 pg/mL (ref 0–486)

## 2019-02-14 MED ORDER — METOPROLOL SUCCINATE ER 50 MG PO TB24: 50.0000 mg | ORAL_TABLET | Freq: Every day | ORAL | 3 refills | Status: DC

## 2019-02-14 NOTE — Telephone Encounter (Signed)
Called pt's daughter about his medications- I suggested the patient take Lasix 40 mg daily.  Toprol 50 mg daily was listed as a discharge medication but they never received it, I will prescribe this as well.  Kerin Ransom PA-C 02/14/2019 5:13 PM

## 2019-02-14 NOTE — Assessment & Plan Note (Signed)
Admitted 01/16/2019- 01/28/2019 with combined COPD and diastolic CHF. Echo showed EF of 50-55%. He diuresed 15L

## 2019-02-14 NOTE — Assessment & Plan Note (Signed)
Hospitalized 7/31-8/04/2019

## 2019-02-14 NOTE — Assessment & Plan Note (Addendum)
C-spine surgery March 2020- pt is still getting PT.  He is in a wheel chair but is able to ambulate some at home.

## 2019-02-14 NOTE — Assessment & Plan Note (Signed)
Lt ABI - 0.62, Rt 0.89

## 2019-02-14 NOTE — Telephone Encounter (Signed)
I spoke with the patient's daughter and clarified medications.  Kerin Ransom PA-C 02/14/2019 5:16 PM

## 2019-02-14 NOTE — Telephone Encounter (Signed)
Called daughter, she would like to verify what doses the patient should be on.  The AVS from today just said to continue meds, but patient has not been taking the Metoprolol since he got out of the hospital, and they had him on double dose of lasix, they would like to verify if he should be taking the Metoprolol, and if he should take 1 or 2 tablets of the lasix.  Will route to PA to advise.

## 2019-02-14 NOTE — Assessment & Plan Note (Signed)
C-pap and O2 at night

## 2019-02-14 NOTE — Progress Notes (Deleted)
error 

## 2019-02-14 NOTE — Progress Notes (Signed)
Cardiology Office Note:    Date:  02/14/2019   ID:  [name], DOB 11/14/1940, MRN [id number]  PCP:  [name], MD  Cardiologist:  Minus Breeding, MD  Electrophysiologist:  None   Referring MD: [name], MD   No chief complaint on file. F/U post hospital  History of Present Illness:    [name] is a 78 y.o. male, retired from [employer], lifelong resident of [place], with a hx of cervical myelopathy.  This was apparently diagnosed after some months back in March 2020.  He ultimately underwent surgery and has been recovering with physical therapy at home.  He was admitted to the hospital 01/17/2019 with acute respiratory failure.  Ultimately this was felt to be a combination of community-acquired pneumonia and diastolic heart failure.  Patient has not previously had any diastolic heart failure issues and has no history of coronary disease.  He did have an incidental finding of coronary artery disease on a chest CT during that admission.  Echocardiogram revealed an EF of 50 to 55%.  Diastolic parameters were indeterminate.  He did not have LVH.  He did have moderate left atrial enlargement.  The patient diuresed 15 L according to hospital records.  His weights have been erratic, the patient is in a wheelchair and it is difficult for him to stand without assistance.  It appears his discharge weight on 01/28/2019 was 186 pounds.  His weight in the office today was 198 pounds.  Since discharge the patient says he feels much better.  He has sleep apnea and uses a CPAP and oxygen at night but has not required oxygen during the day.  He is still in a wheelchair but continuing to work with physical therapy.  Overall he feels improved since his hospitalization.  His wife tells me that he is able to ambulate some in the house now.  He saw his PCP yesterday and a chest x-ray was ordered, I do not have that result.  They were concerned he still had volume overload and I believe they had him  take an extra dose of Lasix.    Past Medical History:  Diagnosis Date  . Arthritis   . Barrett esophagus   . Bronchitis   . Carotid artery occlusion   . COPD (chronic obstructive pulmonary disease) ([place])   . Diabetes mellitus without complication ([place])   . GERD (gastroesophageal reflux disease)   . Headache    migraines  . History of thyroid cancer 2008  . Hypertension   . Restless leg   . Sleep apnea with use of continuous positive airway pressure (CPAP)    2011 [facility] , AHI  77cn central and obstrcutive. 16 cm water , 3 cm EPR.   . Thoracic ascending aortic aneurysm (HCC)    4.2 cm ascending TAA 08/18/17 CTA. Annual imaging recommended.  [name] Ulcer    Peptic ulcer disease    Past Surgical History:  Procedure Laterality Date  . ANTERIOR CERVICAL DECOMP/DISCECTOMY FUSION N/A 09/03/2017   Procedure: ACDF - C3-C4 - C4-C5 - C5-C6;  Surgeon: Kary Kos, MD;  Location: [facility];  Service: Neurosurgery;  Laterality: N/A;  . CAROTID ENDARTERECTOMY Left January 03, 2006   Dr. Amedeo Plenty  . COLONOSCOPY    . EYE SURGERY Bilateral    cataract surgery with lens implants  . game keepers thumb Right   . JOINT REPLACEMENT Left    knee X 2  . ROTATOR CUFF REPAIR Right   . THYROIDECTOMY  2008    Current Medications: Current Meds  Medication Sig  . acetaminophen (TYLENOL) 325 MG tablet Take 2 tablets (650 mg total) by mouth every 4 (four) hours as needed for mild pain ((score 1 to 3) or temp > 100.5).  [name] allopurinol (ZYLOPRIM) 300 MG tablet Take 300 mg by mouth daily.   . baclofen (LIORESAL) 10 MG tablet Take 1-1.5 tablets (10-15 mg total) by mouth 3 (three) times daily. (Patient taking differently: Take 10 mg by mouth 3 (three) times daily. )  . calcium citrate-vitamin D (CITRACAL+D) 315-200 MG-UNIT per tablet Take 1 tablet by mouth daily. 750 mg   . carbidopa-levodopa (SINEMET IR) 10-100 MG tablet Take 1 tablet by mouth 2 (two) times daily. (Patient taking differently: Take 1 tablet by mouth  daily. )  . cholecalciferol (VITAMIN D) 1000 units tablet Take 1,000 Units by mouth daily.   . colchicine 0.6 MG tablet Take 0.6 mg by mouth daily.  . furosemide (LASIX) 40 MG tablet Take 1 tablet (40 mg total) by mouth daily.  [name] guaifenesin (HUMIBID E) 400 MG TABS tablet Take 400 mg by mouth 2 (two) times daily as needed (congestion).  [name] HYDROcodone-acetaminophen (NORCO/VICODIN) 5-325 MG tablet Take 1 tablet by mouth 3 (three) times daily as needed (pain). (Patient taking differently: Take 1 tablet by mouth daily as needed for moderate pain (pain). )  . ipratropium-albuterol (DUONEB) 0.5-2.5 (3) MG/3ML SOLN Take 3 mLs by nebulization every 6 (six) hours as needed (cough).  [name] levothyroxine (SYNTHROID, LEVOTHROID) 200 MCG tablet Take 125 mcg by mouth daily before breakfast.   . metFORMIN (GLUCOPHAGE) 500 MG tablet Take 1 tablet (500 mg total) by mouth 2 (two) times daily with a meal.  . metoprolol succinate (TOPROL-XL) 50 MG 24 hr tablet Take 1 tablet (50 mg total) by mouth daily. Take with or immediately following a meal.  . Multiple Vitamin (MULTIVITAMIN) tablet Take 1 tablet by mouth daily.  . nitroGLYCERIN (NITROSTAT) 0.4 MG SL tablet Place 0.4 mg every 5 (five) minutes as needed under the tongue for chest pain.  [name] oxymetazoline (AFRIN) 0.05 % nasal spray Place 2 sprays into the nose 2 (two) times daily as needed for congestion.   . pantoprazole (PROTONIX) 40 MG tablet Take 40 mg by mouth every evening.  . polyethylene glycol (MIRALAX / GLYCOLAX) packet Take 17 g by mouth daily as needed for moderate constipation. (Patient taking differently: Take 17 g by mouth every other day. )  . rizatriptan (MAXALT) 10 MG tablet Take 10 mg by mouth as needed for migraine.   . simvastatin (ZOCOR) 40 MG tablet Take 40 mg by mouth daily at 6 PM.   . spironolactone (ALDACTONE) 25 MG tablet   . TRAVATAN Z 0.004 % SOLN ophthalmic solution Place 1 drop into both eyes at bedtime.   . TRELEGY ELLIPTA 100-62.5-25  MCG/INH AEPB Inhale 1 puff into the lungs daily.   . VENTOLIN HFA 108 (90 Base) MCG/ACT inhaler Inhale 1-2 puffs into the lungs every 4 (four) hours as needed for wheezing or shortness of breath.      Allergies:   Patient has no known allergies.   Social History   Socioeconomic History  . Marital status: Married    Spouse name: [name]  . Number of children: 2  . Years of education: COLLEGE  . Highest education level: Not on file  Occupational History    Employer: RETIRED  Social Needs  . Financial resource strain: Not on file  . Food  insecurity    Worry: Not on file    Inability: Not on file  . Transportation needs    Medical: Not on file    Non-medical: Not on file  Tobacco Use  . Smoking status: Former Smoker    Years: 40.00    Types: Cigars, Cigarettes    Quit date: 06/19/2005    Years since quitting: 13.6  . Smokeless tobacco: Never Used  . Tobacco comment: smoked 4-6 cigars daily, only smoked cigarettes socially  Substance and Sexual Activity  . Alcohol use: No  . Drug use: No  . Sexual activity: Not on file  Lifestyle  . Physical activity    Days per week: Not on file    Minutes per session: Not on file  . Stress: Not on file  Relationships  . Social Herbalist on phone: Not on file    Gets together: Not on file    Attends religious service: Not on file    Active member of club or organization: Not on file    Attends meetings of clubs or organizations: Not on file    Relationship status: Not on file  Other Topics Concern  . Not on file  Social History Narrative   Patient is married [name]) and lives at home with his wife.   Patient has two children.   Patient is retired.   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   Patient is right-handed.   Patient drinks three cups of coffee per day.     Family History: The patient's family history includes COPD in his mother; Cancer in his father; Diabetes in his father; Hyperlipidemia  in his father and mother; Hypertension in his father and mother; Kidney disease in his father.  ROS:   Please see the history of present illness.     All other systems reviewed and are negative.  EKGs/Labs/Other Studies Reviewed:    The following studies were reviewed today: Echo 01/19/2019 LEA dopplers 01/27/2019  Recent Labs: 01/24/2019: ALT 21 01/28/2019: BUN 42; Creatinine, Ser 0.87; Hemoglobin 12.9; Magnesium 2.7; Platelets 281; Potassium 4.0; Sodium 140  Recent Lipid Panel No results found for: CHOL, TRIG, HDL, CHOLHDL, VLDL, LDLCALC, LDLDIRECT  Physical Exam:    VS:  BP 106/69   Pulse 93   Temp 97.9 F (36.6 C)   Ht 5\' 7"  (1.702 m)   Wt 198 lb (89.8 kg)   SpO2 94%   BMI 31.01 kg/m     Wt Readings from Last 3 Encounters:  02/14/19 198 lb (89.8 kg)  01/28/19 186 lb 4.6 oz (84.5 kg)  05/20/18 203 lb (92.1 kg)     GEN: Chronically ill appearing ale in wheel chair in no acute distress HEENT: Normal NECK: No JVD; No carotid bruits LYMPHATICS: No lymphadenopathy CARDIAC: RRR, no murmurs, rubs, gallops RESPIRATORY:  Decreased at bases, no rales ABDOMEN: Soft, non-tender, non-distended MUSCULOSKELETAL:  1+ pre tibial pitting edema with chronic skin changes. No deformity  SKIN: Warm and dry NEUROLOGIC:  Alert and oriented x 3 PSYCHIATRIC:  Normal affect   ASSESSMENT:    Acute respiratory failure with hypoxemia ([place]) Admitted 01/16/2019- 01/28/2019 with combined COPD and diastolic CHF. Echo showed EF of 50-55%. 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  He is in a wheel chair but is able to ambulate some at home.   Community acquired  pneumonia of right lower lobe of lung ([place]) Hospitalized 7/31-8/04/2019  OSA on CPAP C-pap and O2 at night  PVD (peripheral vascular disease) (HCC) Lt ABI - 0.62, Rt 0.89  PLAN:    Check labs today- CBC, BMP, BNP.  F/U Dr  Percival Spanish in two months.   Medication Adjustments/Labs and Tests Ordered: Current medicines are reviewed at length with the patient today.  Concerns regarding medicines are outlined above.  Orders Placed This Encounter  Procedures  . Basic Metabolic Panel (BMET)  . CBC with Differential  . Pro b natriuretic peptide (BNP)[facility])   No orders of the defined types were placed in this encounter.   Patient Instructions  Medication Instructions:  Your physician recommends that you continue on your current medications as directed. Please refer to the Current Medication list given to you today. If you need a refill on your cardiac medications before your next appointment, please call your pharmacy.   Lab work: Your physician recommends that you return for lab work in: TODAY-BMET, CBC, BNP If you have labs (blood work) drawn today and your tests are completely normal, you will receive your results only by: [name] MyChart Message (if you have MyChart) OR . A paper copy in the mail If you have any lab test that is abnormal or we need to change your treatment, we will call you to review the results.  Testing/Procedures: NONE   Follow-Up: At [facility], you and your health needs are our priority.  As part of our continuing mission to provide you with exceptional heart care, we have created designated Provider Care Teams.  These Care Teams include your primary Cardiologist (physician) and Advanced Practice Providers (APPs -  Physician Assistants and Nurse Practitioners) who all work together to provide you with the care you need, when you need it. You will need a follow up appointment in 2 months. You may see Minus Breeding, MD or one of the following Advanced Practice Providers on your designated Care Team:   Rosaria Ferries, PA-C . Jory Sims, DNP, ANP  Any Other Special Instructions Will Be Listed Below (If Applicable).      Angelena Form, PA-C   02/14/2019 10:29 AM    [facility]

## 2019-02-14 NOTE — Telephone Encounter (Signed)
New Message   Pt c/o medication issue:  1. Name of Medication: metoprolol succinate (TOPROL-XL) 50 MG 24 hr tablet  And furosemide (LASIX) 40 MG tablet   2. How are you currently taking this medication (dosage and times per day)?   3. Are you having a reaction (difficulty breathing--STAT)?   4. What is your medication issue? Patients wife is calling on his behalf. She needs to confirm should the patient continue to take the metoprolol succinate if so then they will need a refill. She also needs to confirm the dose of the lasix. Please call to discuss.

## 2019-02-14 NOTE — Patient Instructions (Signed)
Medication Instructions:  Your physician recommends that you continue on your current medications as directed. Please refer to the Current Medication list given to you today. If you need a refill on your cardiac medications before your next appointment, please call your pharmacy.   Lab work: Your physician recommends that you return for lab work in: TODAY-BMET, CBC, BNP If you have labs (blood work) drawn today and your tests are completely normal, you will receive your results only by: [name] MyChart Message (if you have MyChart) OR . A paper copy in the mail If you have any lab test that is abnormal or we need to change your treatment, we will call you to review the results.  Testing/Procedures: NONE   Follow-Up: At [facility], you and your health needs are our priority.  As part of our continuing mission to provide you with exceptional heart care, we have created designated Provider Care Teams.  These Care Teams include your primary Cardiologist (physician) and Advanced Practice Providers (APPs -  Physician Assistants and Nurse Practitioners) who all work together to provide you with the care you need, when you need it. You will need a follow up appointment in 2 months. You may see Minus Breeding, MD or one of the following Advanced Practice Providers on your designated Care Team:   Rosaria Ferries, PA-C . Jory Sims, DNP, ANP  Any Other Special Instructions Will Be Listed Below (If Applicable).

## 2019-02-17 ENCOUNTER — Other Ambulatory Visit: Payer: Self-pay

## 2019-02-17 MED ORDER — METOPROLOL SUCCINATE ER 50 MG PO TB24: 50.0000 mg | ORAL_TABLET | Freq: Every day | ORAL | 3 refills | Status: DC

## 2019-02-17 NOTE — Telephone Encounter (Signed)
Noted. Thanks.

## 2019-02-17 NOTE — Telephone Encounter (Signed)
Called patient to give lab results and he requested a refill on Metoprolol. Rx sent to pharmacy. Patient voiced understanding.

## 2019-03-07 ENCOUNTER — Other Ambulatory Visit: Payer: Self-pay | Admitting: Neurology

## 2019-03-10 ENCOUNTER — Telehealth: Payer: Self-pay | Admitting: Cardiology

## 2019-03-10 NOTE — Telephone Encounter (Signed)
Returned the call to the patient's wife, per the patient's request. The patient has gained 3-4 pounds since Saturday. She stated that his normal weight is 198 and today he weighs 202 pounds  She also stated that the patient's oxygen level was 90% this morning after he took his CPAP off. He denies any congestion or shortness of breath.   He does have some bilateral lower extremity edema but is asymptomatic.   He currently takes Furosemide 40 mg once daily in the morning and Spironolactone 25 mg in the afternoon.

## 2019-03-10 NOTE — Telephone Encounter (Signed)
Spoke with [name] and he suggested patient take 80mg  of Lasix in the morning for 3 days and see if that helps and continue to take the spironolactone int the evenings. The goal is to have his weight down to 195-198.   Called patients wife [name], per DPR, and gave her [name]'s recommendations and she voiced understanding.

## 2019-03-10 NOTE — Telephone Encounter (Signed)
  Pt c/o swelling: STAT is pt has developed SOB within 24 hours  1) How much weight have you gained and in what time span? Gained 2 pounds since yesterday  2) If swelling, where is the swelling located? Lower legs and ankles  3) Are you currently taking a fluid pill? yes  4) Are you currently SOB? no  5) Do you have a log of your daily weights (if so, list)? yes  6) Have you gained 3 pounds in a day or 5 pounds in a week? yes  7) Have you traveled recently? No  Wife also states his O2 level is 90%, she would like to know if he should up his lasix dosage.

## 2019-03-21 ENCOUNTER — Telehealth: Payer: Self-pay | Admitting: Cardiology

## 2019-03-21 NOTE — Telephone Encounter (Signed)
Returned call to Madex Batterson (wife) she states that pt has gained 3# this week and is swollen and she is very concerned. She states that he takes lasix 40mg . And weighs daily and takes medications as ordered. His creatinine last take was in the normal range. He is very swollen in his ankles. She will increase lasix and take an extra pill today (40mg ) and she will weigh again in the am tomorrow and will call back if the weight does not come off.

## 2019-03-21 NOTE — Telephone Encounter (Signed)
° ° °  Pt c/o swelling: STAT is pt has developed SOB within 24 hours  1) How much weight have you gained and in what time span? 3lbs  2) If swelling, where is the swelling located? ankles  3) Are you currently taking a fluid pill? yes  4) Are you currently SOB? no  5) Do you have a log of your daily weights (if so, list)? 202 today, 199 on Wed  6) Have you gained 3 pounds in a day or 5 pounds in a week?   7) Have you traveled recently? no

## 2019-03-23 ENCOUNTER — Telehealth: Payer: Self-pay | Admitting: Nurse Practitioner

## 2019-03-23 NOTE — Telephone Encounter (Signed)
   Pts wife called to report that [name] weight hasn't come down since taking an extra lasix on Friday, and in fact, has gone up 3 more lbs.  He does have lower ext edema, but is not dyspneic.  I rec that he take lasix 40mg  bid x 3 days and then resume 40mg  daily afterward.  I will reach out to office for f/u sooner than his scheduled Nov visit, as I suspect he will need adjustment in his daily lasix dosing going forward.  Caller verbalized understanding and was grateful for the call back.  Murray Hodgkins, NP 03/23/2019, 1:15 PM

## 2019-03-25 ENCOUNTER — Ambulatory Visit: Payer: Medicare Other | Admitting: Podiatry

## 2019-03-25 ENCOUNTER — Other Ambulatory Visit: Payer: Self-pay

## 2019-03-25 DIAGNOSIS — M79675 Pain in left toe(s): Secondary | ICD-10-CM | POA: Diagnosis not present

## 2019-03-25 DIAGNOSIS — B351 Tinea unguium: Secondary | ICD-10-CM | POA: Diagnosis not present

## 2019-03-25 DIAGNOSIS — E1151 Type 2 diabetes mellitus with diabetic peripheral angiopathy without gangrene: Secondary | ICD-10-CM

## 2019-03-25 DIAGNOSIS — M79674 Pain in right toe(s): Secondary | ICD-10-CM | POA: Diagnosis not present

## 2019-03-26 ENCOUNTER — Encounter: Payer: Self-pay | Admitting: Podiatry

## 2019-03-26 NOTE — Progress Notes (Signed)
  Subjective:  Patient ID: [name], male    DOB: 07-May-1941,  MRN: [id number]  Chief Complaint  Patient presents with  . Nail Problem    pt is here for a nail trim   78 y.o. male returns for the above complaint.  His last A1c was 5.6.  Today he is here for debridement of the bilateral mycotic toenails that are painful in nature.  No other acute complaints.  Objective:  There were no vitals filed for this visit. Podiatric Exam: Vascular: dorsalis pedis and posterior tibial pulses are palpable bilateral. Capillary return is immediate. Temperature gradient is WNL. Skin turgor WNL  Sensorium: Normal Semmes Weinstein monofilament test. Normal tactile sensation bilaterally. Nail Exam: Pt has thick disfigured discolored nails with subungual debris noted bilateral entire nail hallux through fifth toenails Ulcer Exam: There is no evidence of ulcer or pre-ulcerative changes or infection. Orthopedic Exam: Muscle tone and strength are WNL. No limitations in general ROM. No crepitus or effusions noted. HAV  B/L.  Hammer toes 2-5  B/L. Skin: No Porokeratosis. No infection or ulcers  Assessment & Plan:  Patient was evaluated and treated and all questions answered.  Onychomycosis with pain  -Nails palliatively debrided as below. -Educated on self-care  Procedure: Nail Debridement Rationale: pain  Type of Debridement: manual, sharp debridement. Instrumentation: Nail nipper, rotary burr. Number of Nails: 10  Procedures and Treatment: Consent by patient was obtained for treatment procedures. The patient understood the discussion of treatment and procedures well. All questions were answered thoroughly reviewed. Debridement of mycotic and hypertrophic toenails, 1 through 5 bilateral and clearing of subungual debris. No ulceration, no infection noted.  Return Visit-Office Procedure: Patient instructed to return to the office for a follow up visit 3 months for continued evaluation and treatment.   Boneta Lucks, DPM    No follow-ups on file.

## 2019-04-03 ENCOUNTER — Ambulatory Visit: Payer: Medicare Other

## 2019-04-06 NOTE — Progress Notes (Signed)
Cardiology Office Note   Date:  04/07/2019   [name], DOB [birth year], MRN [id number]  PCP:  [name], MD  Cardiologist:   Minus Breeding, MD   Chief Complaint  Patient presents with  . Edema      History of Present Illness: [name] is a 78 y.o. male who presents for follow up of acute respiratory failure.  This was thought to be acute diastolic HF and pneumonia.  EF was 50 - 55%.  His weight is difficult to assess because he is in a wheelchair.  Discharge weight was about 186 lbs.  He called earlier this month with increased weight and edema.    He comes back today for routine follow up.  The patient denies any new symptoms such as chest discomfort, neck or arm discomfort. There has been no new shortness of breath, PND or orthopnea. There have been no reported palpitations, presyncope or syncope.   He has a chronic mild nonproductive cough.  His baseline weight is probably closer to 196 he thinks.  Although now today he is up as reported below.  Stand slowly working out rather than going up suddenly.  He had soft with the table but his wife does not cook with it.  He does not drink in excess of 48 ounces of fluid a day.  He denies any new cardiovascular complaints and says he is breathing comfortably.  He wears O2 at night.  He gets around with a walker.    Past Medical History:  Diagnosis Date  . Arthritis   . Barrett esophagus   . Bronchitis   . Carotid artery occlusion   . COPD (chronic obstructive pulmonary disease) ([place])   . Diabetes mellitus without complication ([place])   . GERD (gastroesophageal reflux disease)   . Headache    migraines  . History of thyroid cancer 2008  . Hypertension   . Restless leg   . Sleep apnea with use of continuous positive airway pressure (CPAP)    2011 [facility] , [facility]  77cn central and obstrcutive. 16 cm water , 3 cm EPR.   . Thoracic ascending aortic aneurysm ([facility])    4.2 cm ascending TAA 08/18/17 CT[facility]. Annual  imaging recommended.  [name] Ulcer    Peptic ulcer disease    Past Surgical History:  Procedure Laterality Date  . ANTERIOR CERVICAL DECOMP/DISCECTOMY FUSION N/A 09/03/2017   Procedure: ACDF - C3-C4 - C4-C5 - C5-C6;  Surgeon: Kary Kos, MD;  Location: [facility];  Service: Neurosurgery;  Laterality: N/A;  . CAROTID ENDARTERECTOMY Left January 03, 2006   Dr. Amedeo Plenty  . COLONOSCOPY    . EYE SURGERY Bilateral    cataract surgery with lens implants  . game keepers thumb Right   . JOINT REPLACEMENT Left    knee X 2  . ROTATOR CUFF REPAIR Right   . THYROIDECTOMY  2008     Current Outpatient Medications  Medication Sig Dispense Refill  . acetaminophen (TYLENOL) 325 MG tablet Take 2 tablets (650 mg total) by mouth every 4 (four) hours as needed for mild pain ((score 1 to 3) or temp > 100.5). 40 tablet 0  . allopurinol (ZYLOPRIM) 300 MG tablet Take 300 mg by mouth daily.   12  . baclofen (LIORESAL) 10 MG tablet Take 1 tablet (10 mg total) by mouth 3 (three) times daily. 135 tablet 0  . calcium citrate-vitamin D (CITRACAL+D) 315-200 MG-UNIT per tablet Take 1 tablet by  mouth daily. 750 mg     . carbidopa-levodopa (SINEMET IR) 10-100 MG tablet Take 1 tablet by mouth daily.    . cholecalciferol (VITAMIN D) 1000 units tablet Take 1,000 Units by mouth daily.     . furosemide (LASIX) 40 MG tablet Take 1 tablet (40 mg total) by mouth daily. 30 tablet 2  . guaifenesin (HUMIBID E) 400 MG TABS tablet Take 400 mg by mouth 2 (two) times daily as needed (congestion).    [name] HYDROcodone-acetaminophen (NORCO/VICODIN) 5-325 MG tablet Take 1 tablet by mouth 3 (three) times daily as needed (pain). (Patient taking differently: Take 1 tablet by mouth daily as needed for moderate pain (pain). ) 30 tablet 0  . levothyroxine (SYNTHROID) 125 MCG tablet Take 125 mcg by mouth daily before breakfast.    . metFORMIN (GLUCOPHAGE-XR) 500 MG 24 hr tablet Take 500 mg by mouth daily.     . metoprolol succinate (TOPROL-XL) 50 MG 24 hr  tablet Take 1 tablet (50 mg total) by mouth daily. Take with or immediately following a meal. 90 tablet 3  . Multiple Vitamin (MULTIVITAMIN) tablet Take 1 tablet by mouth daily.    . nitroGLYCERIN (NITROSTAT) 0.4 MG SL tablet Place 0.4 mg every 5 (five) minutes as needed under the tongue for chest pain.    [name] oxymetazoline (AFRIN) 0.05 % nasal spray Place 2 sprays into the nose 2 (two) times daily as needed for congestion.     . pantoprazole (PROTONIX) 40 MG tablet Take 40 mg by mouth every evening.    . polyethylene glycol powder (MIRALAX) 17 GM/SCOOP powder Take by mouth as directed. 17 gm every other day    . rizatriptan (MAXALT) 10 MG tablet Take 10 mg by mouth as needed for migraine.     . simvastatin (ZOCOR) 40 MG tablet Take 40 mg by mouth daily at 6 PM.   0  . spironolactone (ALDACTONE) 25 MG tablet 25 mg daily.     . TRAVATAN Z 0.004 % SOLN ophthalmic solution Place 1 drop into both eyes at bedtime.     . TRELEGY ELLIPTA 100-62.5-25 MCG/INH AEPB Inhale 1 puff into the lungs daily.   4  . VENTOLIN HFA 108 (90 Base) MCG/ACT inhaler Inhale 1-2 puffs into the lungs every 4 (four) hours as needed for wheezing or shortness of breath.     [name] ipratropium-albuterol (DUONEB) 0.5-2.5 (3) MG/3ML SOLN Take 3 mLs by nebulization every 6 (six) hours as needed (cough).     No current facility-administered medications for this visit.     Allergies:   Patient has no known allergies.    ROS:  Please see the history of present illness.   Otherwise, review of systems are positive for none.   All other systems are reviewed and negative.    PHYSICAL EXAM: VS:  BP (!) 142/80   Pulse (!) 57   Temp (!) 96.9 F (36.1 C)   Ht 5\' 7"  (1.702 m)   Wt 204 lb 3.2 oz (92.6 kg)   BMI 31.98 kg/m  , BMI Body mass index is 31.98 kg/m. GEN:  No distress NECK:  No jugular venous distention at 90 degrees, waveform within normal limits, carotid upstroke brisk and symmetric, no bruits, no thyromegaly LYMPHATICS:  No  cervical adenopathy LUNGS:  Clear to auscultation bilaterally BACK:  No CVA tenderness CHEST:  Unremarkable HEART:  S1 and S2 within normal limits, no S3, no S4, no clicks, no rubs, no murmurs ABD:  Positive bowel sounds  normal in frequency in pitch, no bruits, no rebound, no guarding, unable to assess midline mass or bruit with the patient seated. EXT:  2 plus pulses throughout, mild edema, no cyanosis no clubbing SKIN:  No rashes no nodules NEURO:  Cranial nerves II through XII grossly intact, motor grossly intact throughout PSYCH:  Cognitively intact, oriented to person place and time    EKG:  EKG is not ordered today.    Recent Labs: 01/24/2019: ALT 21 01/28/2019: Magnesium 2.7 02/14/2019: BUN 18; Creatinine, Ser 1.09; Hemoglobin 12.5; NT-Pro BNP 482; Platelets 190; Potassium 3.8; Sodium 146    Lipid Panel No results found for: CHOL, TRIG, HDL, CHOLHDL, VLDL, LDLCALC, LDLDIRECT    Wt Readings from Last 3 Encounters:  04/07/19 204 lb 3.2 oz (92.6 kg)  02/14/19 198 lb (89.8 kg)  01/28/19 186 lb 4.6 oz (84.5 kg)      Other studies Reviewed: Additional studies/ records that were reviewed today include: Labs. Review of the above records demonstrates:  Please see elsewhere in the note.     ASSESSMENT AND PLAN:  Acute Diastolic HF ([place]) We had a long discussion today about salt restriction and fluid restriction.  I talked at length about as needed dosing of his diuretic.  For the next 3 days will take an extra 20 mg of Lasix as I think his weight is up slightly and would want he is dry weight to be at least below 200.  Some of his weight gain I think is because he is eating better.  I will check a basic metabolic profile today.    OSA on CPAP No change in therapy  PVD (peripheral vascular disease) ([place]) He does have PVD as listed Lt ABI - 0.62, Rt 0.89.  He has no nonhealing ulcers.  No resting pain.  No change in therapy.   Current medicines are reviewed at length with  the patient today.  The patient does not have concerns regarding medicines.  The following changes have been made: As above  Labs/ tests ordered today include:   Orders Placed This Encounter  Procedures  . Basic metabolic panel     Disposition:   FU with Kerin Ransom PAc in 3 months.     Signed, Minus Breeding, MD  04/07/2019 2:43 PM    [place]

## 2019-04-07 ENCOUNTER — Other Ambulatory Visit: Payer: Self-pay

## 2019-04-07 ENCOUNTER — Encounter: Payer: Self-pay | Admitting: Cardiology

## 2019-04-07 ENCOUNTER — Ambulatory Visit: Payer: Medicare Other | Admitting: Cardiology

## 2019-04-07 VITALS — BP 142/80 | HR 57 | Temp 96.9°F | Ht 67.0 in | Wt 204.2 lb

## 2019-04-07 DIAGNOSIS — G4733 Obstructive sleep apnea (adult) (pediatric): Secondary | ICD-10-CM | POA: Diagnosis not present

## 2019-04-07 DIAGNOSIS — Z9989 Dependence on other enabling machines and devices: Secondary | ICD-10-CM

## 2019-04-07 DIAGNOSIS — I5031 Acute diastolic (congestive) heart failure: Secondary | ICD-10-CM | POA: Diagnosis not present

## 2019-04-07 DIAGNOSIS — Z5181 Encounter for therapeutic drug level monitoring: Secondary | ICD-10-CM | POA: Diagnosis not present

## 2019-04-07 NOTE — Patient Instructions (Signed)
Medication Instructions:  TAKE AND EXTRA 20 MG OF FUROSEMIDE FOR 3 DAYS ONLY  *If you need a refill on your cardiac medications before your next appointment, please call your pharmacy*  Lab Work: BMET TODAY  If you have labs (blood work) drawn today and your tests are completely normal, you will receive your results only by: [name] MyChart Message (if you have MyChart) OR . A paper copy in the mail If you have any lab test that is abnormal or we need to change your treatment, we will call you to review the results.  Testing/Procedures: NONE   Follow-Up: At [facility], you and your health needs are our priority.  As part of our continuing mission to provide you with exceptional heart care, we have created designated Provider Care Teams.  These Care Teams include your primary Cardiologist (physician) and Advanced Practice Providers (APPs -  Physician Assistants and Nurse Practitioners) who all work together to provide you with the care you need, when you need it.  Your next appointment:   3 months  The format for your next appointment:   In Person  Provider:   Kerin Ransom PA   Other Instructions  WEIGH YOURSELF DAILY EACH MORNING AFTER YOU EMPTY YOUR BLADDER  CALL THE OFFICE IF YOUR WEIGHT GOES UP 3 PONDS IN 24 HOURS OR 5 POUNDS IN 1 WEEK    Low-Sodium Eating Plan Sodium, which is an element that makes up salt, helps you maintain a healthy balance of fluids in your body. Too much sodium can increase your blood pressure and cause fluid and waste to be held in your body. Your health care provider or dietitian may recommend following this plan if you have high blood pressure (hypertension), kidney disease, liver disease, or heart failure. Eating less sodium can help lower your blood pressure, reduce swelling, and protect your heart, liver, and kidneys. What are tips for following this plan? General guidelines  Most people on this plan should limit their sodium intake to 1,500-2,000 mg  (milligrams) of sodium each day. Reading food labels   The Nutrition Facts label lists the amount of sodium in one serving of the food. If you eat more than one serving, you must multiply the listed amount of sodium by the number of servings.  Choose foods with less than 140 mg of sodium per serving.  Avoid foods with 300 mg of sodium or more per serving. Shopping  Look for lower-sodium products, often labeled as "low-sodium" or "no salt added."  Always check the sodium content even if foods are labeled as "unsalted" or "no salt added".  Buy fresh foods. ? Avoid canned foods and premade or frozen meals. ? Avoid canned, cured, or processed meats  Buy breads that have less than 80 mg of sodium per slice. Cooking  Eat more home-cooked food and less restaurant, buffet, and fast food.  Avoid adding salt when cooking. Use salt-free seasonings or herbs instead of table salt or sea salt. Check with your health care provider or pharmacist before using salt substitutes.  Cook with plant-based oils, such as canola, sunflower, or olive oil. Meal planning  When eating at a restaurant, ask that your food be prepared with less salt or no salt, if possible.  Avoid foods that contain MSG (monosodium glutamate). MSG is sometimes added to [place] food, bouillon, and some canned foods. What foods are recommended? The items listed may not be a complete list. Talk with your dietitian about what dietary choices are best for  you. Grains Low-sodium cereals, including oats, puffed wheat and rice, and shredded wheat. Low-sodium crackers. Unsalted rice. Unsalted pasta. Low-sodium bread. Whole-grain breads and whole-grain pasta. Vegetables Fresh or frozen vegetables. "No salt added" canned vegetables. "No salt added" tomato sauce and paste. Low-sodium or reduced-sodium tomato and vegetable juice. Fruits Fresh, frozen, or canned fruit. Fruit juice. Meats and other protein foods Fresh or frozen (no salt  added) meat, poultry, seafood, and fish. Low-sodium canned tuna and salmon. Unsalted nuts. Dried peas, beans, and lentils without added salt. Unsalted canned beans. Eggs. Unsalted nut butters. Dairy Milk. Soy milk. Cheese that is naturally low in sodium, such as ricotta cheese, fresh mozzarella, or Swiss cheese Low-sodium or reduced-sodium cheese. Cream cheese. Yogurt. Fats and oils Unsalted butter. Unsalted margarine with no trans fat. Vegetable oils such as canola or olive oils. Seasonings and other foods Fresh and dried herbs and spices. Salt-free seasonings. Low-sodium mustard and ketchup. Sodium-free salad dressing. Sodium-free light mayonnaise. Fresh or refrigerated horseradish. Lemon juice. Vinegar. Homemade, reduced-sodium, or low-sodium soups. Unsalted popcorn and pretzels. Low-salt or salt-free chips. What foods are not recommended? The items listed may not be a complete list. Talk with your dietitian about what dietary choices are best for you. Grains Instant hot cereals. Bread stuffing, pancake, and biscuit mixes. Croutons. Seasoned rice or pasta mixes. Noodle soup cups. Boxed or frozen macaroni and cheese. Regular salted crackers. Self-rising flour. Vegetables Sauerkraut, pickled vegetables, and relishes. Olives. [place] fries. Onion rings. Regular canned vegetables (not low-sodium or reduced-sodium). Regular canned tomato sauce and paste (not low-sodium or reduced-sodium). Regular tomato and vegetable juice (not low-sodium or reduced-sodium). Frozen vegetables in sauces. Meats and other protein foods Meat or fish that is salted, canned, smoked, spiced, or pickled. Bacon, ham, sausage, hotdogs, corned beef, chipped beef, packaged lunch meats, salt pork, jerky, pickled herring, anchovies, regular canned tuna, sardines, salted nuts. Dairy Processed cheese and cheese spreads. Cheese curds. Blue cheese. Feta cheese. String cheese. Regular cottage cheese. Buttermilk. Canned milk. Fats and  oils Salted butter. Regular margarine. Ghee. Bacon fat. Seasonings and other foods Onion salt, garlic salt, seasoned salt, table salt, and sea salt. Canned and packaged gravies. Worcestershire sauce. Tartar sauce. Barbecue sauce. Teriyaki sauce. Soy sauce, including reduced-sodium. Steak sauce. Fish sauce. Oyster sauce. Cocktail sauce. Horseradish that you find on the shelf. Regular ketchup and mustard. Meat flavorings and tenderizers. Bouillon cubes. Hot sauce and Tabasco sauce. Premade or packaged marinades. Premade or packaged taco seasonings. Relishes. Regular salad dressings. Salsa. Potato and tortilla chips. Corn chips and puffs. Salted popcorn and pretzels. Canned or dried soups. Pizza. Frozen entrees and pot pies. Summary  Eating less sodium can help lower your blood pressure, reduce swelling, and protect your heart, liver, and kidneys.  Most people on this plan should limit their sodium intake to 1,500-2,000 mg (milligrams) of sodium each day.  Canned, boxed, and frozen foods are high in sodium. Restaurant foods, fast foods, and pizza are also very high in sodium. You also get sodium by adding salt to food.  Try to cook at home, eat more fresh fruits and vegetables, and eat less fast food, canned, processed, or prepared foods. This information is not intended to replace advice given to you by your health care provider. Make sure you discuss any questions you have with your health care provider. Document Released: 11/25/2001 Document Revised: 05/18/2017 Document Reviewed: 05/29/2016 Elsevier Patient Education  2020 [employer].

## 2019-04-08 LAB — BASIC METABOLIC PANEL
BUN/Creatinine Ratio: 21 (ref 10–24)
BUN: 22 mg/dL (ref 8–27)
CO2: 29 mmol/L (ref 20–29)
Calcium: 9.6 mg/dL (ref 8.6–10.2)
Chloride: 100 mmol/L (ref 96–106)
Creatinine, Ser: 1.04 mg/dL (ref 0.76–1.27)
GFR calc Af Amer: 79 mL/min/{1.73_m2} (ref >59)
GFR calc non Af Amer: 68 mL/min/{1.73_m2} (ref >59)
Glucose: 78 mg/dL (ref 65–99)
Potassium: 4.7 mmol/L (ref 3.5–5.2)
Sodium: 146 mmol/L — ABNORMAL HIGH (ref 134–144)

## 2019-04-11 ENCOUNTER — Other Ambulatory Visit: Payer: Self-pay

## 2019-04-11 ENCOUNTER — Ambulatory Visit: Payer: Medicare Other | Attending: Internal Medicine

## 2019-04-11 VITALS — BP 118/68 | HR 64

## 2019-04-11 DIAGNOSIS — M6281 Muscle weakness (generalized): Secondary | ICD-10-CM | POA: Diagnosis present

## 2019-04-11 DIAGNOSIS — R2689 Other abnormalities of gait and mobility: Secondary | ICD-10-CM | POA: Insufficient documentation

## 2019-04-11 DIAGNOSIS — R293 Abnormal posture: Secondary | ICD-10-CM | POA: Diagnosis present

## 2019-04-11 NOTE — Therapy (Signed)
[facility] [address] [place], [place], [address] Phone: [phone]   Fax:  [phone]  Physical Therapy Evaluation  Patient Details  Name: [name] MRN: [id number] Date of Birth: [birth year] Referring Provider (PT): [name]   Encounter Date: 04/11/2019  PT End of Session - 04/11/19 1450    Visit Number  1    Number of Visits  9    Date for PT Re-Evaluation  06/13/19    Authorization Type  [employer] medicare 10th visit progress note    PT Start Time  1446    PT Stop Time  1535    PT Time Calculation (min)  49 min    Equipment Utilized During Treatment  Gait belt    Activity Tolerance  Patient tolerated treatment well    Behavior During Therapy  [facility] for tasks assessed/performed       Past Medical History:  Diagnosis Date  . Arthritis   . Barrett esophagus   . Bronchitis   . Carotid artery occlusion   . COPD (chronic obstructive pulmonary disease) ([place])   . Diabetes mellitus without complication ([place])   . GERD (gastroesophageal reflux disease)   . Headache    migraines  . History of thyroid cancer 2008  . Hypertension   . Restless leg   . Sleep apnea with use of continuous positive airway pressure (CPAP)    [facility] , AHI  77cn central and obstrcutive. 16 cm water , 3 cm EPR.   . Thoracic ascending aortic aneurysm (HCC)    4.2 cm ascending TAA 08/18/17 CTA. Annual imaging recommended.  [name] Ulcer    Peptic ulcer disease    Past Surgical History:  Procedure Laterality Date  . ANTERIOR CERVICAL DECOMP/DISCECTOMY FUSION N/A 09/03/2017   Procedure: ACDF - C3-C4 - C4-C5 - C5-C6;  Surgeon: Kary Kos, MD;  Location: [place];  Service: Neurosurgery;  Laterality: N/A;  . CAROTID ENDARTERECTOMY Left January 03, 2006   Dr. Amedeo Plenty  . COLONOSCOPY    . EYE SURGERY Bilateral    cataract surgery with lens implants  . game keepers thumb Right   . JOINT REPLACEMENT Left    knee X 2  . ROTATOR CUFF  REPAIR Right   . THYROIDECTOMY  2008    Vitals:   04/11/19 1450  BP: 118/68  Pulse: 64  SpO2: 92%     Subjective Assessment - 04/11/19 1450    Subjective  Refered for unsteady gait, cervical myelopathy for balance and fall prevention. Recent hospital stay 7/31 to 8/11 due to sepsis from pneumonia and UTI and respiratory failure from CHF. Pt uses 2L oxygen at night since hospital stay. Pt does weigh daily and waas 198 today. Had been going up last week so cardiologist had him take extra 1/2 a pill of furosemide and has improved some. Pt reports he is only really standing at home other than with therapy in home. Prior to hospital  stay he was walking [place] distances with walker and transport chair for community. Does have manual wheelchair at home that he is able to propel himself in.    Pertinent History  PMH:  78 y.o. male with past medical history significant for coronary artery disease, COPD not on home oxygen, diabetes, restless leg syndrome, OSA on CPAP, CHF. Anterior cervical decomp/disectomy 08/2017    Patient Stated Goals  Pt wants to walk.    Currently in Pain?  Yes    Pain Score  3    10/10 at worst   Pain Location  Shoulder    Pain Orientation  Left    Pain Descriptors / Indicators  Sharp    Pain Type  Chronic pain    Pain Onset  More than a month ago    Pain Frequency  Intermittent    Aggravating Factors   movement    Multiple Pain Sites  Yes    Pain Score  7    Pain Location  Leg    Pain Orientation  Left    Pain Descriptors / Indicators  Aching    Pain Type  Chronic pain         [facility] PT Assessment - 04/11/19 1451      Assessment   Medical Diagnosis  cervical myelopathy and unsteady gait    Referring Provider (PT)  [name]    Onset Date/Surgical Date  01/17/19    Hand Dominance  Right    Prior Therapy  home health PT       Precautions   Precautions  Fall      Balance Screen   Has the patient fallen in the past 6 months  No    Has the patient  had a decrease in activity level because of a fear of falling?   Yes    Is the patient reluctant to leave their home because of a fear of falling?   No      Home Social worker  Private residence    Living Arrangements  Spouse/significant other    Available Help at Discharge  Family    Type of [place]  One level    [place] - 2 wheels;Wheelchair - manual;Shower seat;Grab bars - tub/shower;Bedside commode;Hand held shower head   transport chair, sliding board to get in shower, O2   Additional Comments  has caregiver that comes 2x/day 3 hours in morning and 3 hours a night      Prior Function   Level of Independence  Needs assistance with ADLs;Needs assistance with gait   supervision with walking with walker in house   Vocation  Retired    Leisure  getting out of the house      Observation/Other Assessments-Edema    Edema  --   +2 edema bilateral LE     Sensation   Light Touch  Impaired Detail    Light Touch Impaired Details  Impaired RLE;Impaired LLE    Additional Comments  decreased light touch in toes       Posture/Postural Control   Posture/Postural Control  Postural limitations    Postural Limitations  Rounded Shoulders;Forward head      ROM / Strength   AROM / PROM / Strength  Strength      Strength   Strength Assessment Site  Shoulder;Elbow;Hip;Knee;Ankle    Right/Left Shoulder  Right;Left    Right Shoulder Flexion  2-/5    Left Shoulder Flexion  2-/5    Right/Left Elbow  Right;Left    Right Elbow Flexion  4/5    Right Elbow Extension  4/5    Left Elbow Flexion  4/5    Left Elbow Extension  4/5    Right/Left Hip  Right;Left    Right Hip Flexion  3-/5    Left Hip Flexion  4/5    Right/Left Knee  Right;Left    Right Knee Flexion  2+/5    Right Knee Extension  3/5    Left Knee Flexion  4/5    Left Knee Extension  4/5    Right/Left Ankle  Right;Left    Right Ankle Dorsiflexion   3+/5    Left Ankle Dorsiflexion  4/5      Transfers   Transfers  Sit to Stand;Stand to Sit;Squat Pivot Transfers    Sit to Stand  4: Min assist    Sit to Stand Details  Verbal cues for technique    Stand to Sit  4: Min guard    Squat Pivot Transfers  5: Supervision    Squat Pivot Transfer Details (indicate cue type and reason)  transport chair to mat    Comments  30 sec sit to stand=4 from edge of mat      Ambulation/Gait   Ambulation/Gait  Yes    Ambulation/Gait Assistance  4: Min assist    Ambulation/Gait Assistance Details  Flexed posture    Ambulation Distance (Feet)  20 Feet    Assistive device  Rolling walker   w/c follow   Gait Pattern  Step-through pattern;Decreased step length - right;Decreased step length - left;Right flexed knee in stance;Left flexed knee in stance;Trunk flexed;Poor foot clearance - left;Poor foot clearance - right    Ambulation Surface  Level;Indoor                Objective measurements completed on examination: See above findings.              PT Education - 04/11/19 1611    Education Details  Pt was instructed in PT plan of care. Pt only agreeable to 1x/week as that's all wife will allow    Person(s) Educated  Patient;Child(ren)    Methods  Explanation    Comprehension  Verbalized understanding       PT Short Term Goals - 04/11/19 1618      PT SHORT TERM GOAL #1   Title  Pt will be instructed in HEP for strengthening and balance activities to progress functional mobility on own safely with caregiver assist.    Time  4    Period  Weeks    Status  New    Target Date  05/11/19      PT SHORT TERM GOAL #2   Title  Pt will be able to perform sit to stand transfers from varied surfaces supervision for improved mobility.    Time  4    Period  Weeks    Status  New    Target Date  05/11/19      PT SHORT TERM GOAL #3   Title  Pt will ambulate 68' with FWW CGA for improved mobility in home.    Baseline  25' min assist with FWW  on 04/11/19    Time  4    Period  Weeks    Status  New        PT Long Term Goals - 04/11/19 1620      PT LONG TERM GOAL #1   Title  Pt will increase 30 sec sit to stand from 4 reps edge of mat to 6 or more for improved functional strength and mobility.    Baseline  4 reps on 04/11/19    Time  8    Period  Weeks    Status  New    Target Date  06/10/19      PT LONG TERM GOAL #2   Title  Pt  will be able to ambulate >100' with FWW supervision for improved household distances.    Time  8    Period  Weeks    Status  New    Target Date  06/10/19      PT LONG TERM GOAL #3   Title  Pt will be able to maintain standing x 5 min with minimal UE support for improved balance to assist with ADLs.    Time  8    Period  Weeks    Status  New    Target Date  06/10/19             Plan - 04/11/19 1613    Clinical Impression Statement  Refered for unsteady gait, cervical myelopathy for balance and fall prevention. Recent hospital stay 7/31 to 8/11 due to sepsis from pneumonia and UTI and respiratory failure from CHF. Pt presents with weakness in right LE more the left. 30 sec sit to stand limited to 4. Impaired sensation in feet. Pt needs assistance with transfers and gait only short distances with therapist at this time. Pt wil benefit from skilled PT to address strength, balance and functional mobility deficits.    Personal Factors and Comorbidities  Comorbidity 3+;Age    Examination-Activity Limitations  Bathing;Locomotion Level;Transfers;Stand    Examination-Participation Restrictions  Community Activity    Stability/Clinical Decision Making  Evolving/Moderate complexity    Clinical Decision Making  Moderate    Rehab Potential  Good    PT Frequency  1x / week    PT Duration  8 weeks    PT Treatment/Interventions  ADLs/Self Care Home Management;Cryotherapy;DME Instruction;Moist Heat;Gait training;Stair training;Functional mobility training;Therapeutic activities;Therapeutic  exercise;Balance training;Patient/family education;Neuromuscular re-education;Manual techniques;Passive range of motion    PT Next Visit Plan  Begin LE strengthening HEP, transfer training, gait training with FWW.    Consulted and Agree with Plan of Care  Patient;Family member/caregiver    Family Member Consulted  daughter       Patient will benefit from skilled therapeutic intervention in order to improve the following deficits and impairments:  Abnormal gait, Cardiopulmonary status limiting activity, Decreased activity tolerance, Decreased balance, Difficulty walking, Increased edema, Impaired sensation, Impaired flexibility, Impaired UE functional use, Postural dysfunction, Pain  Visit Diagnosis: Abnormal posture  Muscle weakness (generalized)  Other abnormalities of gait and mobility     Problem List Patient Active Problem List   Diagnosis Date Noted  . PVD (peripheral vascular disease) ([place]) 02/14/2019  . Essential hypertension 02/13/2019  . Non-insulin treated type 2 diabetes mellitus ([place]) 02/13/2019  . Severe sepsis ([place]) 01/17/2019  . Community acquired pneumonia of right lower lobe of lung 01/17/2019  . Postural urinary incontinence 05/20/2018  . 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 Myoclonic jerking 05/20/2018  . 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 Acute respiratory failure with hypoxemia ([place]) 08/18/2017  . Hypokalemia 08/18/2017  . Leukocytosis 08/18/2017  . Muscle atrophy of lower extremity 08/09/2017  . Bowel and bladder incontinence 08/09/2017  . Right-sided muscle weakness 08/09/2017  . Neuropathy 08/09/2017  . 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/2017  . Abnormality of gait 07/12/2017  . Myoclonic jerkings, 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/2017  . Acquired right foot drop 07/12/2017  . UTI (urinary tract infection) due to Enterococcus 07/12/2017  . Pressure  injury of skin 06/21/2017  . Acute cystitis 06/20/2017  . Generalized weakness 06/20/2017  . Dehydration 06/20/2017  . Dyspnea 12/26/2016  .  Chronic cough 12/26/2016  . Hypersomnia with sleep apnea 10/30/2016  . CSA (central sleep apnea) 10/30/2016  . Wheezing symptom 10/30/2016  . 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 RLS (restless legs syndrome) 08/25/2014  . Primary gout 08/25/2014  . OSA on CPAP 08/25/2014  . Severe obesity (BMI >= 40) ([place]) 08/25/2014  . Obesity (BMI 30-39.9) 02/18/2013  . 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 S/P TKR (total knee replacement) 11/29/2012  . History of thyroid cancer     Electa Sniff, PT, DPT, NCS 04/11/2019, 4:25 PM  [facility] [address] [place], [place], [address] Phone: [phone]   Fax:  [phone]  Name: EMMETTE KROG MRN: [id number] Date of Birth: 1940/09/14

## 2019-04-14 ENCOUNTER — Other Ambulatory Visit: Payer: Self-pay | Admitting: Neurology

## 2019-04-18 ENCOUNTER — Other Ambulatory Visit: Payer: Self-pay | Admitting: Cardiology

## 2019-04-18 MED ORDER — FUROSEMIDE 40 MG PO TABS: 40.0000 mg | ORAL_TABLET | Freq: Every day | ORAL | 11 refills | Status: DC

## 2019-04-18 NOTE — Telephone Encounter (Signed)
 *  STAT* If patient is at the pharmacy, call can be transferred to refill team.   1. Which medications need to be refilled? (please list name of each medication and dose if known) furosemide (LASIX) 40 MG tablet  2. Which pharmacy/location (including street and city if local pharmacy) is medication to be sent to? [facility]  3. Do they need a 30 day or 90 day supply? [place]

## 2019-04-24 ENCOUNTER — Ambulatory Visit: Payer: Medicare Other | Admitting: Cardiology

## 2019-04-30 ENCOUNTER — Other Ambulatory Visit: Payer: Self-pay

## 2019-04-30 ENCOUNTER — Encounter: Payer: Self-pay | Admitting: Adult Health

## 2019-04-30 ENCOUNTER — Ambulatory Visit: Payer: Medicare Other | Admitting: Adult Health

## 2019-04-30 VITALS — BP 97/66 | HR 79 | Temp 97.8°F | Ht 67.0 in

## 2019-04-30 DIAGNOSIS — G253 Myoclonus: Secondary | ICD-10-CM

## 2019-04-30 DIAGNOSIS — R269 Unspecified abnormalities of gait and mobility: Secondary | ICD-10-CM | POA: Diagnosis not present

## 2019-04-30 DIAGNOSIS — G4733 Obstructive sleep apnea (adult) (pediatric): Secondary | ICD-10-CM | POA: Diagnosis not present

## 2019-04-30 DIAGNOSIS — Z9989 Dependence on other enabling machines and devices: Secondary | ICD-10-CM | POA: Diagnosis not present

## 2019-04-30 MED ORDER — BACLOFEN 10 MG PO TABS: ORAL_TABLET | ORAL | 0 refills | Status: DC

## 2019-04-30 NOTE — Progress Notes (Signed)
PATIENT: [name] DOB: 1940/12/25  REASON FOR VISIT: follow up HISTORY FROM: patient  HISTORY OF PRESENT ILLNESS: Today 04/30/19:  Mr. [name] is a 78 year old male with a history of obstructive sleep apnea on CPAP.  He returns today for follow-up.  His download indicates that he uses machine nightly for compliance of 100%.  He uses machine greater than 4 hours each night.  On average he uses his machine 9 hours and 35 minutes.  His residual AHI is 11.8 on 12 to 20 cm of water with an EPR of 1.  His leak in the 95th percentile is 77.2 L/min.  At the last visit he was encouraged to shave his facial hair and use a chinstrap.  He reports that his mask continues to leak at night.  Patient also reports that he had pneumonia in the summer and then a urinary tract infection.  Since that time his legs has been extremely weak.  He has been using a wheelchair.  He did do home physical therapy but is now starting outpatient physical therapy.  He continues to take baclofen 10 mg 3 times a day.  He reports that his legs jerk a lot at night.  He is wondering if the baclofen dose at bedtime can be increased.  He returns today for an evaluation.    REVIEW OF SYSTEMS: Out of a complete 14 system review of symptoms, the patient complains only of the following symptoms, and all other reviewed systems are negative.  See HPI  ALLERGIES: No Known Allergies  HOME MEDICATIONS: Outpatient Medications Prior to Visit  Medication Sig Dispense Refill  . acetaminophen (TYLENOL) 325 MG tablet Take 2 tablets (650 mg total) by mouth every 4 (four) hours as needed for mild pain ((score 1 to 3) or temp > 100.5). 40 tablet 0  . allopurinol (ZYLOPRIM) 300 MG tablet Take 300 mg by mouth daily.   12  . baclofen (LIORESAL) 10 MG tablet TAKE 1 TABLET BY MOUTH 3 TIMES DAILY. 270 tablet 0  . calcium citrate-vitamin D (CITRACAL+D) 315-200 MG-UNIT per tablet Take 1 tablet by mouth daily. 750 mg     . carbidopa-levodopa  (SINEMET IR) 10-100 MG tablet Take 1 tablet by mouth daily.    . cholecalciferol (VITAMIN D) 1000 units tablet Take 1,000 Units by mouth daily.     . furosemide (LASIX) 40 MG tablet Take 1 tablet (40 mg total) by mouth daily. 30 tablet 11  . guaifenesin (HUMIBID E) 400 MG TABS tablet Take 400 mg by mouth 2 (two) times daily as needed (congestion).    [name] HYDROcodone-acetaminophen (NORCO/VICODIN) 5-325 MG tablet Take 1 tablet by mouth 3 (three) times daily as needed (pain). (Patient taking differently: Take 1 tablet by mouth daily as needed for moderate pain (pain). ) 30 tablet 0  . ipratropium-albuterol (DUONEB) 0.5-2.5 (3) MG/3ML SOLN Take 3 mLs by nebulization every 6 (six) hours as needed (cough).    [name] levothyroxine (SYNTHROID) 125 MCG tablet Take 125 mcg by mouth daily before breakfast.    . metFORMIN (GLUCOPHAGE-XR) 500 MG 24 hr tablet Take 500 mg by mouth daily.     . metoprolol succinate (TOPROL-XL) 50 MG 24 hr tablet Take 1 tablet (50 mg total) by mouth daily. Take with or immediately following a meal. 90 tablet 3  . Multiple Vitamin (MULTIVITAMIN) tablet Take 1 tablet by mouth daily.    . nitroGLYCERIN (NITROSTAT) 0.4 MG SL tablet Place 0.4 mg every 5 (five) minutes as needed  under the tongue for chest pain.    [name] oxymetazoline (AFRIN) 0.05 % nasal spray Place 2 sprays into the nose 2 (two) times daily as needed for congestion.     . pantoprazole (PROTONIX) 40 MG tablet Take 40 mg by mouth every evening.    . polyethylene glycol powder (MIRALAX) 17 GM/SCOOP powder Take by mouth as directed. 17 gm every other day    . rizatriptan (MAXALT) 10 MG tablet Take 10 mg by mouth as needed for migraine.     . simvastatin (ZOCOR) 40 MG tablet Take 40 mg by mouth daily at 6 PM.   0  . spironolactone (ALDACTONE) 25 MG tablet 25 mg daily.     . TRAVATAN Z 0.004 % SOLN ophthalmic solution Place 1 drop into both eyes at bedtime.     . TRELEGY ELLIPTA 100-62.5-25 MCG/INH AEPB Inhale 1 puff into the lungs  daily.   4  . VENTOLIN HFA 108 (90 Base) MCG/ACT inhaler Inhale 1-2 puffs into the lungs every 4 (four) hours as needed for wheezing or shortness of breath.      No facility-administered medications prior to visit.     PAST MEDICAL HISTORY: Past Medical History:  Diagnosis Date  . Arthritis   . Barrett esophagus   . Bronchitis   . Carotid artery occlusion   . COPD (chronic obstructive pulmonary disease) ([place])   . Diabetes mellitus without complication ([place])   . GERD (gastroesophageal reflux disease)   . Headache    migraines  . History of thyroid cancer 2008  . Hypertension   . Restless leg   . Sleep apnea with use of continuous positive airway pressure (CPAP)    [facility] , AHI  77cn central and obstrcutive. 16 cm water , 3 cm EPR.   . Thoracic ascending aortic aneurysm (HCC)    4.2 cm ascending TAA 08/18/17 CTA. Annual imaging recommended.  [name] Ulcer    Peptic ulcer disease    PAST SURGICAL HISTORY: Past Surgical History:  Procedure Laterality Date  . ANTERIOR CERVICAL DECOMP/DISCECTOMY FUSION N/A 09/03/2017   Procedure: ACDF - C3-C4 - C4-C5 - C5-C6;  Surgeon: Kary Kos, MD;  Location: [facility];  Service: Neurosurgery;  Laterality: N/A;  . CAROTID ENDARTERECTOMY Left January 03, 2006   Dr. Amedeo Plenty  . COLONOSCOPY    . EYE SURGERY Bilateral    cataract surgery with lens implants  . game keepers thumb Right   . JOINT REPLACEMENT Left    knee X 2  . ROTATOR CUFF REPAIR Right   . THYROIDECTOMY  2008    FAMILY HISTORY: Family History  Problem Relation Age of Onset  . COPD Mother   . Hyperlipidemia Mother   . Hypertension Mother   . Diabetes Father   . Kidney disease Father        ESRD  . Hypertension Father   . Cancer Father   . Hyperlipidemia Father     SOCIAL HISTORY: Social History   Socioeconomic History  . Marital status: Married    Spouse name: [name]  . Number of children: 2  . Years of education: COLLEGE  . Highest education level: Not on file   Occupational History    Employer: RETIRED  Social Needs  . Financial resource strain: Not on file  . Food insecurity    Worry: Not on file    Inability: Not on file  . Transportation needs    Medical: Not on file    Non-medical:  Not on file  Tobacco Use  . Smoking status: Former Smoker    Years: 40.00    Types: Cigars, Cigarettes    Quit date: 06/19/2005    Years since quitting: 13.8  . Smokeless tobacco: Never Used  . Tobacco comment: smoked 4-6 cigars daily, only smoked cigarettes socially  Substance and Sexual Activity  . Alcohol use: No  . Drug use: No  . Sexual activity: Not on file  Lifestyle  . Physical activity    Days per week: Not on file    Minutes per session: Not on file  . Stress: Not on file  Relationships  . Social Herbalist on phone: Not on file    Gets together: Not on file    Attends religious service: Not on file    Active member of club or organization: Not on file    Attends meetings of clubs or organizations: Not on file    Relationship status: Not on file  . Intimate partner violence    Fear of current or ex partner: Not on file    Emotionally abused: Not on file    Physically abused: Not on file    Forced sexual activity: Not on file  Other Topics Concern  . Not on file  Social History Narrative   Patient is married [name]) and lives at home with his wife.   Patient has two children.   Patient is retired.   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   Patient is right-handed.   Patient drinks three cups of coffee per day.      PHYSICAL EXAM  Vitals:   04/30/19 0852  BP: 97/66  Pulse: 79  Temp: 97.8 F (36.6 C)  TempSrc: Oral  Height: 5\' 7"  (1.702 m)   Body mass index is 31.98 kg/m.   Generalized: Well developed, in no acute distress  Chest: Lungs clear to auscultation bilaterally  Neurological examination  Mentation: Alert oriented to time, place, history taking. Follows all commands speech and  language fluent Cranial nerve II-XII: Extraocular movements were full, visual field were full on confrontational test Head turning and shoulder shrug  were normal and symmetric. Motor: The motor testing reveals 5 over 5 strength of all 4 extremities. Good symmetric motor tone is noted throughout.  Sensory: Sensory testing is intact to soft touch on all 4 extremities. No evidence of extinction is noted.  Gait and station: Patient is in a wheelchair.   DIAGNOSTIC DATA (LABS, IMAGING, TESTING) - I reviewed patient records, labs, notes, testing and imaging myself where available.  Lab Results  Component Value Date   WBC 7.7 02/14/2019   HGB 12.5 (L) 02/14/2019   HCT 37.7 02/14/2019   MCV 87 02/14/2019   PLT 190 02/14/2019      Component Value Date/Time   NA 146 (H) 04/07/2019 1426   K 4.7 04/07/2019 1426   CL 100 04/07/2019 1426   CO2 29 04/07/2019 1426   GLUCOSE 78 04/07/2019 1426   GLUCOSE 114 (H) 01/28/2019 0355   BUN 22 04/07/2019 1426   CREATININE 1.04 04/07/2019 1426   CALCIUM 9.6 04/07/2019 1426   PROT 6.9 01/24/2019 0808   ALBUMIN 3.7 01/28/2019 0355   AST 18 01/24/2019 0808   ALT 21 01/24/2019 0808   ALKPHOS 48 01/24/2019 0808   BILITOT 0.2 (L) 01/24/2019 0808   GFRNONAA 68 04/07/2019 1426   GFRAA 79 04/07/2019 1426      ASSESSMENT AND  PLAN 78 y.o. year old male  has a past medical history of Arthritis, Barrett esophagus, Bronchitis, Carotid artery occlusion, COPD (chronic obstructive pulmonary disease) ([place]), Diabetes mellitus without complication ([place]), GERD (gastroesophageal reflux disease), Headache, History of thyroid cancer (2008), Hypertension, Restless leg, Sleep apnea with use of continuous positive airway pressure (CPAP), Thoracic ascending aortic aneurysm ([place]), and Ulcer. here with:  1.  Obstructive sleep apnea on CPAP 2.  Myoclonic jerking 3.  Abnormality of gait  The patient has good compliance with his CPAP.  He continues to have a high leak.  He  will be sent for mask refitting.  I will increase baclofen to 10 mg in the morning and at noon and 15 mg at bedtime.  He is encouraged to participate in physical therapy.  He is advised that if his symptoms worsen or he develops new symptoms he should let us know.   Ward Givens, MSN, NP-C 04/30/2019, 8:46 AM [facility] [address] [place], [place] [address] [phone]

## 2019-04-30 NOTE — Progress Notes (Signed)
CM sent to [facility] with new cpap orders, received. sy

## 2019-04-30 NOTE — Patient Instructions (Signed)

## 2019-05-02 ENCOUNTER — Ambulatory Visit: Payer: Medicare Other | Attending: Internal Medicine

## 2019-05-02 ENCOUNTER — Other Ambulatory Visit: Payer: Self-pay

## 2019-05-02 DIAGNOSIS — R2681 Unsteadiness on feet: Secondary | ICD-10-CM | POA: Insufficient documentation

## 2019-05-02 DIAGNOSIS — R2689 Other abnormalities of gait and mobility: Secondary | ICD-10-CM | POA: Insufficient documentation

## 2019-05-02 DIAGNOSIS — M6281 Muscle weakness (generalized): Secondary | ICD-10-CM | POA: Diagnosis present

## 2019-05-02 DIAGNOSIS — R293 Abnormal posture: Secondary | ICD-10-CM | POA: Diagnosis present

## 2019-05-02 NOTE — Patient Instructions (Signed)
Access Code: [email or web address]  URL: [email or web address]  Date: 05/02/2019  Prepared by: Cherly Anderson   Exercises Seated Ankle Pumps - 10 reps - 3 sets - 1x daily - 7x weekly Seated Long Arc Quad - 10 reps - 2 sets - 1x daily - 7x weekly Seated March - 10 reps - 2 sets - 1x daily - 7x weekly Sit to Stand with Counter Support - 3 reps - 2 sets - 1x daily - 7x weekly

## 2019-05-02 NOTE — Therapy (Signed)
[facility] [address] [place], [place], [address] Phone: [phone]   Fax:  [phone]  Physical Therapy Treatment  Patient Details  Name: [name] MRN: [id number] Date of Birth: 15-Jan-1941 Referring Provider (PT): [name]   Encounter Date: 05/02/2019  PT End of Session - 05/02/19 1455    Visit Number  2    Number of Visits  9    Date for PT Re-Evaluation  06/13/19    Authorization Type  [employer] medicare 10th visit progress note    PT Start Time  1452    PT Stop Time  1530    PT Time Calculation (min)  38 min    Equipment Utilized During Treatment  Gait belt    Activity Tolerance  Patient tolerated treatment well    Behavior During Therapy  [facility] for tasks assessed/performed       Past Medical History:  Diagnosis Date  . Arthritis   . Barrett esophagus   . Bronchitis   . Carotid artery occlusion   . COPD (chronic obstructive pulmonary disease) ([place])   . Diabetes mellitus without complication ([place])   . GERD (gastroesophageal reflux disease)   . Headache    migraines  . History of thyroid cancer 2008  . Hypertension   . Restless leg   . Sleep apnea with use of continuous positive airway pressure (CPAP)    [facility] , AHI  77cn central and obstrcutive. 16 cm water , 3 cm EPR.   . Thoracic ascending aortic aneurysm (HCC)    4.2 cm ascending TAA 08/18/17 CTA. Annual imaging recommended.  [name] Ulcer    Peptic ulcer disease    Past Surgical History:  Procedure Laterality Date  . ANTERIOR CERVICAL DECOMP/DISCECTOMY FUSION N/A 09/03/2017   Procedure: ACDF - C3-C4 - C4-C5 - C5-C6;  Surgeon: Kary Kos, MD;  Location: [facility];  Service: Neurosurgery;  Laterality: N/A;  . CAROTID ENDARTERECTOMY Left January 03, 2006   Dr. Amedeo Plenty  . COLONOSCOPY    . EYE SURGERY Bilateral    cataract surgery with lens implants  . game keepers thumb Right   . JOINT REPLACEMENT Left    knee X 2  . ROTATOR CUFF REPAIR  Right   . THYROIDECTOMY  2008    There were no vitals filed for this visit.  Subjective Assessment - 05/02/19 1454    Subjective  Pt denies any new issues but states right knee is giving on him some that last week or 2.    Pertinent History  PMH:  78 y.o. male with past medical history significant for coronary artery disease, COPD not on home oxygen, diabetes, restless leg syndrome, OSA on CPAP, CHF. Anterior cervical decomp/disectomy 08/2017    Patient Stated Goals  Pt wants to walk.    Currently in Pain?  Yes    Pain Score  2     Pain Location  Shoulder    Pain Orientation  Left    Pain Descriptors / Indicators  Aching    Pain Type  Chronic pain    Pain Onset  More than a month ago    Pain Frequency  Intermittent    Pain Score  2    Pain Location  Back    Pain Orientation  Lower    Pain Descriptors / Indicators  Aching    Pain Type  Chronic pain    Pain Frequency  Intermittent  [facility] Adult PT Treatment/Exercise - 05/02/19 1527      Transfers   Transfers  Sit to Stand;Stand to Sit    Sit to Stand  4: Min guard;4: Min assist    Sit to Stand Details  Verbal cues for technique    Sit to Stand Details (indicate cue type and reason)  Pt was cued on hand placement. In // bars had one hand on bar and one at chair. With walker one hand on front of walker and one on chair. Required min assist to rise at walker and initially trying to put both hands on walker. Increased time to rise with flexed posture, relying heaviliy on arms.     Stand to Sit  4: Min guard    Comments  O2 sat=90-92% throughout      Self-Care   Self-Care  Other Self-Care Comments    Other Self-Care Comments   Pt was instructed in deep pursed lip breathing, Encouraged trying to have forceful cough to clear mucous if possible. Wet cough noted. Pt instructed to monitor for any change to mucous as reports this is chronic. Advised if any color other than clear to let MD know. Pt states it  is currently clear.  Pt was also educated on importance of trying to elevate legs more during the day to help with edema management. Pt's wife present and states that he never does as always sitting at computer.       Neuro Re-ed    Neuro Re-ed Details   Standing in // bars with bilateral UE support with verbal cues for upright posture x 30 sec, marching in place with PT blocking right knee with during right stance to prevent buckling x 8 each LE. Standing with 2 second bouts of no UE support x 3.Standing with 1 UE support x 20 sec each side CGA and PT watching right knee closely. Standing with patient reaching for targets x 5 each side. Pt needed frequent seated rest break during activities. O2 sat=90% with standing activities. Standing at walker with stepping right foot forward and back x 5 with heavy UE support CGA.       Exercises   Exercises  Other Exercises    Other Exercises   Pt performed sit to stand in // bars x 3 close SBA. Seated LAQ 5 x 2 right through partial range with target to shoot for and x 10 left, seated march x 10 bilateral.  Pt was cued to use pursed lip breathing throughout.              PT Education - 05/02/19 1549    Education Details  PT initiated seated HEP    Person(s) Educated  Patient;Spouse    Methods  Explanation;Handout;Demonstration    Comprehension  Verbalized understanding       PT Short Term Goals - 04/11/19 1618      PT SHORT TERM GOAL #1   Title  Pt will be instructed in HEP for strengthening and balance activities to progress functional mobility on own safely with caregiver assist.    Time  4    Period  Weeks    Status  New    Target Date  05/11/19      PT SHORT TERM GOAL #2   Title  Pt will be able to perform sit to stand transfers from varied surfaces supervision for improved mobility.    Time  4    Period  Weeks    Status  New  Target Date  05/11/19      PT SHORT TERM GOAL #3   Title  Pt will ambulate 44' with FWW CGA for  improved mobility in home.    Baseline  25' min assist with FWW on 04/11/19    Time  4    Period  Weeks    Status  New        PT Long Term Goals - 04/11/19 1620      PT LONG TERM GOAL #1   Title  Pt will increase 30 sec sit to stand from 4 reps edge of mat to 6 or more for improved functional strength and mobility.    Baseline  4 reps on 04/11/19    Time  8    Period  Weeks    Status  New    Target Date  06/10/19      PT LONG TERM GOAL #2   Title  Pt will be able to ambulate >100' with FWW supervision for improved household distances.    Time  8    Period  Weeks    Status  New    Target Date  06/10/19      PT LONG TERM GOAL #3   Title  Pt will be able to maintain standing x 5 min with minimal UE support for improved balance to assist with ADLs.    Time  8    Period  Weeks    Status  New    Target Date  06/10/19            Plan - 05/02/19 1549    Clinical Impression Statement  Pt has been weighing daily and has not been going up. Wife confirmed. Pt was having increased dificulty with weight shifting on right LE today not trusting to take a step. PT noted wet sounding cough but patient reports this is not new and discharge is clear. Advised to monitor closely and notify MD if any further changes.    Personal Factors and Comorbidities  Comorbidity 3+;Age    Examination-Activity Limitations  Bathing;Locomotion Level;Transfers;Stand    Examination-Participation Restrictions  Community Activity    Stability/Clinical Decision Making  Evolving/Moderate complexity    Rehab Potential  Good    PT Frequency  1x / week    PT Duration  8 weeks    PT Treatment/Interventions  ADLs/Self Care Home Management;Cryotherapy;DME Instruction;Moist Heat;Gait training;Stair training;Functional mobility training;Therapeutic activities;Therapeutic exercise;Balance training;Patient/family education;Neuromuscular re-education;Manual techniques;Passive range of motion    PT Next Visit Plan  See  how initial HEP going. Monitor lungs as wet cough with O2 sats 90% last visit. Continue with transfer training, strengthening, standing balance. Gait as able    Consulted and Agree with Plan of Care  Patient;Family member/caregiver    Family Member Consulted  wife       Patient will benefit from skilled therapeutic intervention in order to improve the following deficits and impairments:  Abnormal gait, Cardiopulmonary status limiting activity, Decreased activity tolerance, Decreased balance, Difficulty walking, Increased edema, Impaired sensation, Impaired flexibility, Impaired UE functional use, Postural dysfunction, Pain  Visit Diagnosis: Muscle weakness (generalized)  Abnormal posture  Other abnormalities of gait and mobility     Problem List Patient Active Problem List   Diagnosis Date Noted  . PVD (peripheral vascular disease) ([place]) 02/14/2019  . Essential hypertension 02/13/2019  . Non-insulin treated type 2 diabetes mellitus ([place]) 02/13/2019  . Severe sepsis ([place]) 01/17/2019  . Community acquired pneumonia of right lower lobe of lung  01/17/2019  . Postural urinary incontinence 05/20/2018  . 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 Myoclonic jerking 05/20/2018  . 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 Acute respiratory failure with hypoxemia ([place]) 08/18/2017  . Hypokalemia 08/18/2017  . Leukocytosis 08/18/2017  . Muscle atrophy of lower extremity 08/09/2017  . Bowel and bladder incontinence 08/09/2017  . Right-sided muscle weakness 08/09/2017  . Neuropathy 08/09/2017  . 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/2017  . Abnormality of gait 07/12/2017  . Myoclonic jerkings, 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/2017  . Acquired right foot drop 07/12/2017  . UTI (urinary tract infection) due to Enterococcus 07/12/2017  . Pressure injury of skin 06/21/2017  . Acute cystitis 06/20/2017  .  Generalized weakness 06/20/2017  . Dehydration 06/20/2017  . Dyspnea 12/26/2016  . Chronic cough 12/26/2016  . Hypersomnia with sleep apnea 10/30/2016  . CSA (central sleep apnea) 10/30/2016  . Wheezing symptom 10/30/2016  . 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 RLS (restless legs syndrome) 08/25/2014  . Primary gout 08/25/2014  . OSA on CPAP 08/25/2014  . Severe obesity (BMI >= 40) ([place]) 08/25/2014  . Obesity (BMI 30-39.9) 02/18/2013  . 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 S/P TKR (total knee replacement) 11/29/2012  . History of thyroid cancer     Electa Sniff, PT, DPT, NCS 05/02/2019, 3:54 PM  [facility] [address] [place], [place], [address] Phone: [phone]   Fax:  [phone]  Name: [name] MRN: [id number] Date of Birth: 1940/11/12

## 2019-05-06 ENCOUNTER — Telehealth: Payer: Self-pay | Admitting: Adult Health

## 2019-05-06 NOTE — Telephone Encounter (Signed)
Pt's wife called wanting to know the update on the referral that was to be sent for the pt to get his cpap machine mask reset. Please advise.

## 2019-05-06 NOTE — Telephone Encounter (Signed)
Called wife, [name] on [place] and informed her that the CPAP orders were sent to [facility] on 04/30/19 I advised she call to check on status, gave her their #.  She verbalized understanding, appreciation.

## 2019-05-07 ENCOUNTER — Ambulatory Visit: Payer: Self-pay | Admitting: Adult Health

## 2019-05-09 ENCOUNTER — Other Ambulatory Visit: Payer: Self-pay

## 2019-05-09 ENCOUNTER — Ambulatory Visit: Payer: Medicare Other | Admitting: Physical Therapy

## 2019-05-09 ENCOUNTER — Encounter: Payer: Self-pay | Admitting: Physical Therapy

## 2019-05-09 DIAGNOSIS — M6281 Muscle weakness (generalized): Secondary | ICD-10-CM

## 2019-05-09 DIAGNOSIS — R293 Abnormal posture: Secondary | ICD-10-CM

## 2019-05-09 DIAGNOSIS — R2689 Other abnormalities of gait and mobility: Secondary | ICD-10-CM

## 2019-05-09 DIAGNOSIS — R2681 Unsteadiness on feet: Secondary | ICD-10-CM

## 2019-05-09 NOTE — Patient Instructions (Signed)
Stand at sink with caregiver/spouse with you. Have your chair behind you for safety. Start with 1 minute hold. Do this 1-2 times a day.   Progress the time standing up to 5 minutes in 30 second increments. Only progress when current time is easy with both stands for a few days in a row.

## 2019-05-11 NOTE — Therapy (Signed)
[facility] [address] [place], [place], [address] Phone: [phone]   Fax:  [phone]  Physical Therapy Treatment  Patient Details  Name: [name] MRN: [id number] Date of Birth: [birth year] Referring Provider (PT): [name]   Encounter Date: 05/09/2019     05/09/19 1538  PT Visits / Re-Eval  Visit Number 3  Number of Visits 9  Date for PT Re-Evaluation 06/13/19  Authorization  Authorization Type [employer] medicare 10th visit progress note  PT Time Calculation  PT Start Time 1532  PT Stop Time 1615  PT Time Calculation (min) 43 min  PT - End of Session  Equipment Utilized During Treatment Gait belt  Activity Tolerance Patient tolerated treatment well  Behavior During Therapy [facility] for tasks assessed/performed    Past Medical History:  Diagnosis Date  . Arthritis   . Barrett esophagus   . Bronchitis   . Carotid artery occlusion   . COPD (chronic obstructive pulmonary disease) ([place])   . Diabetes mellitus without complication ([place])   . GERD (gastroesophageal reflux disease)   . Headache    migraines  . History of thyroid cancer 2008  . Hypertension   . Restless leg   . Sleep apnea with use of continuous positive airway pressure (CPAP)    [facility] , AHI  77cn central and obstrcutive. 16 cm water , 3 cm EPR.   . Thoracic ascending aortic aneurysm (HCC)    4.2 cm ascending TAA 08/18/17 CTA. Annual imaging recommended.  [name] Ulcer    Peptic ulcer disease    Past Surgical History:  Procedure Laterality Date  . ANTERIOR CERVICAL DECOMP/DISCECTOMY FUSION N/A 09/03/2017   Procedure: ACDF - C3-C4 - C4-C5 - C5-C6;  Surgeon: Kary Kos, MD;  Location: [facility];  Service: Neurosurgery;  Laterality: N/A;  . CAROTID ENDARTERECTOMY Left January 03, 2006   Dr. Amedeo Plenty  . COLONOSCOPY    . EYE SURGERY Bilateral    cataract surgery with lens implants  . game keepers thumb Right   . JOINT REPLACEMENT Left    knee X 2  . ROTATOR CUFF REPAIR Right   . THYROIDECTOMY  2008    There were no vitals filed for this visit.     05/09/19 1534  Symptoms/Limitations  Subjective Only standing at home to get morning weights. No falls. Reports that his breathing is doing good.  Pertinent History PMH:  78 y.o. male with past medical history significant for coronary artery disease, COPD not on home oxygen, diabetes, restless leg syndrome, OSA on CPAP, CHF. Anterior cervical decomp/disectomy 08/2017  Patient Stated Goals Pt wants to walk.  Pain Assessment  Currently in Pain? No/denies  Pain Score 0      05/09/19 1549  Transfers  Transfers Sit to Stand;Stand to Sit  Sit to Stand 4: Min guard;4: Min assist  Sit to Stand Details Verbal cues for technique  Sit to Stand Details (indicate cue type and reason) min assist to RW, min guard assist to sink  Stand to Sit 4: Min guard;3: Mod assist  Stand to Sit Details (indicate cue type and reason) Verbal cues for technique;Verbal cues for sequencing;Verbal cues for safe use of DME/AE  Stand to Sit Details mod assist for controlled descent to chair after gait attempt, min guard to sit to chair after standing at sink .  Comments stood at sink with min guard assist working on posture, stood for 1 minute working on [employer]  Ambulation/Gait  Ambulation/Gait Yes  Ambulation/Gait Assistance 4: Min assist;3: Mod assist  Ambulation/Gait Assistance Details attempted gait with right knee buckling with mod assist to maintain balance and sit safely into transport chair.    Assistive device Rolling walker  Gait Pattern Step-through pattern;Decreased step length - right;Decreased step length - left;Right flexed knee in stance;Left flexed knee in stance;Trunk flexed;Poor foot clearance - left;Poor foot clearance - right  Ambulation Surface Level;Indoor  Exercises  Exercises Other Exercises  Other Exercises  added seated hamstring stretch to HEP today. performed to bil LE's  in session for 1.5 minute holds; Scifit from transport chair LE's only at level 2.5 for 6 minutes with emphasis on full knee extension.      05/09/19 1724  PT Education  Education Details added hamstring stretch and standing at sink to HEP today  Person(s) Educated Patient;Spouse  Methods Explanation;Demonstration;Handout  Comprehension Verbalized understanding;Returned demonstration;Verbal cues required;Need further instruction;Tactile cues required         PT Short Term Goals - 05/09/19 1539      PT SHORT TERM GOAL #1   Title  Pt will be instructed in HEP for strengthening and balance activities to progress functional mobility on own safely with caregiver assist.    Baseline  05/09/19: met with current program. Adding hamstring stretch and standing at sink.    Status  Achieved    Target Date  05/11/19      PT SHORT TERM GOAL #2   Title  Pt will be able to perform sit to stand transfers from varied surfaces supervision for improved mobility.    Baseline  05/09/19: continues to need assistance at times to achieve standing    Time  --    Period  --    Status  Not Met    Target Date  05/11/19      PT SHORT TERM GOAL #3   Title  Pt will ambulate 82' with FWW CGA for improved mobility in home.    Baseline  05/09/19: unable to ambulate this session due to knee bucking.    Time  --    Period  --    Status  Not Met        PT Long Term Goals - 04/11/19 1620      PT LONG TERM GOAL #1   Title  Pt will increase 30 sec sit to stand from 4 reps edge of mat to 6 or more for improved functional strength and mobility.    Baseline  4 reps on 04/11/19    Time  8    Period  Weeks    Status  New    Target Date  06/10/19      PT LONG TERM GOAL #2   Title  Pt will be able to ambulate >100' with FWW supervision for improved household distances.    Time  8    Period  Weeks    Status  New    Target Date  06/10/19      PT LONG TERM GOAL #3   Title  Pt will be able to maintain  standing x 5 min with minimal UE support for improved balance to assist with ADLs.    Time  8    Period  Weeks    Status  New    Target Date  06/10/19          05/09/19 1539  Plan  Clinical Impression Statement Today's skilled session continued to focus on stretching with seated hamstring  stretch added to HEP, standing with standing at sink added to HEP and attempted gait. Pt's knee buckling limited gait this session. The pt slowly progressing toward goals and should benefit from continued PT to progress toward unmet goals.  Personal Factors and Comorbidities Comorbidity 3+;Age  Examination-Activity Limitations Bathing;Locomotion Level;Transfers;Stand  Examination-Participation Restrictions Community Activity  Pt will benefit from skilled therapeutic intervention in order to improve on the following deficits Abnormal gait;Cardiopulmonary status limiting activity;Decreased activity tolerance;Decreased balance;Difficulty walking;Increased edema;Impaired sensation;Impaired flexibility;Impaired UE functional use;Postural dysfunction;Pain  Stability/Clinical Decision Making Evolving/Moderate complexity  Rehab Potential Good  PT Frequency 1x / week  PT Duration 8 weeks  PT Treatment/Interventions ADLs/Self Care Home Management;Cryotherapy;DME Instruction;Moist Heat;Gait training;Stair training;Functional mobility training;Therapeutic activities;Therapeutic exercise;Balance training;Patient/family education;Neuromuscular re-education;Manual techniques;Passive range of motion  PT Next Visit Plan Monitor lungs as wet cough with O2 sats 90% last visit. Continue with transfer training, strengthening, standing balance. Gait as able  PT Home Exercise Plan [email or web address]  Consulted and Agree with Plan of Care Patient;Family member/caregiver  Family Member Consulted wife         Patient will benefit from skilled therapeutic intervention in order to improve the following deficits and impairments:  Abnormal  gait, Cardiopulmonary status limiting activity, Decreased activity tolerance, Decreased balance, Difficulty walking, Increased edema, Impaired sensation, Impaired flexibility, Impaired UE functional use, Postural dysfunction, Pain  Visit Diagnosis: Muscle weakness (generalized)  Abnormal posture  Unsteadiness on feet  Other abnormalities of gait and mobility     Problem List Patient Active Problem List   Diagnosis Date Noted  . PVD (peripheral vascular disease) ([place]) 02/14/2019  . Essential hypertension 02/13/2019  . Non-insulin treated type 2 diabetes mellitus ([place]) 02/13/2019  . Severe sepsis ([place]) 01/17/2019  . Community acquired pneumonia of right lower lobe of lung 01/17/2019  . Postural urinary incontinence 05/20/2018  . 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 Myoclonic jerking 05/20/2018  . 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 Acute respiratory failure with hypoxemia ([place]) 08/18/2017  . Hypokalemia 08/18/2017  . Leukocytosis 08/18/2017  . Muscle atrophy of lower extremity 08/09/2017  . Bowel and bladder incontinence 08/09/2017  . Right-sided muscle weakness 08/09/2017  . Neuropathy 08/09/2017  . 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/2017  . Abnormality of gait 07/12/2017  . Myoclonic jerkings, 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/2017  . Acquired right foot drop 07/12/2017  . UTI (urinary tract infection) due to Enterococcus 07/12/2017  . Pressure injury of skin 06/21/2017  . Acute cystitis 06/20/2017  . Generalized weakness 06/20/2017  . Dehydration 06/20/2017  . Dyspnea 12/26/2016  . Chronic cough 12/26/2016  . Hypersomnia with sleep apnea 10/30/2016  . CSA (central sleep apnea) 10/30/2016  . Wheezing symptom 10/30/2016  . 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 RLS (restless legs syndrome) 08/25/2014  . Primary gout 08/25/2014  . OSA on CPAP 08/25/2014  . Severe  obesity (BMI >= 40) ([place]) 08/25/2014  . Obesity (BMI 30-39.9) 02/18/2013  . 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 S/P TKR (total knee replacement) 11/29/2012  . History of thyroid cancer     Willow Ora, PTA, [facility] [address] [place], [place] [address] [phone] 05/11/19, 5:30 PM   Name: [name] MRN: [id number] Date of Birth: July 17, 1940

## 2019-05-23 ENCOUNTER — Ambulatory Visit: Payer: Medicare Other

## 2019-05-30 ENCOUNTER — Other Ambulatory Visit: Payer: Self-pay

## 2019-05-30 ENCOUNTER — Ambulatory Visit: Payer: Medicare Other | Attending: Internal Medicine

## 2019-05-30 DIAGNOSIS — R2689 Other abnormalities of gait and mobility: Secondary | ICD-10-CM | POA: Diagnosis present

## 2019-05-30 DIAGNOSIS — R293 Abnormal posture: Secondary | ICD-10-CM | POA: Diagnosis present

## 2019-05-30 DIAGNOSIS — M6281 Muscle weakness (generalized): Secondary | ICD-10-CM | POA: Diagnosis not present

## 2019-05-30 NOTE — Patient Instructions (Addendum)
Access Code: [email or web address]  URL: [email or web address]  Date: 05/30/2019  Prepared by: Cherly Anderson   Exercises Seated Ankle Pumps - 10 reps - 3 sets - 1x daily - 7x weekly Seated Long Arc Quad - 10 reps - 2 sets - 1x daily - 7x weekly Seated March - 10 reps - 2 sets - 1x daily - 7x weekly Sit to Stand with Counter Support - 3 reps - 2 sets - 1x daily - 7x weekly Seated Hamstring Stretch with Chair - 2-3 reps - 1 sets - 60 hold - 1x daily - 7x weekly Sidelying Hip Flexor Stretch with Caregiver - 3 reps - 1 sets - 30 sec hold - 1x daily - 7x weekly Clamshell - 10 reps - 1 sets - 1x daily - 7x weekly

## 2019-05-30 NOTE — Therapy (Signed)
[facility] [address] [place], [place], [address] Phone: [phone]   Fax:  [phone]  Physical Therapy Treatment  Patient Details  Name: [name] MRN: [id number] Date of Birth: [birth year] Referring Provider (PT): [name]   Encounter Date: 05/30/2019  PT End of Session - 05/30/19 1451    Visit Number  4    Number of Visits  9    Date for PT Re-Evaluation  06/13/19    Authorization Type  [employer] medicare 10th visit progress note    PT Start Time  1446    PT Stop Time  1545    PT Time Calculation (min)  59 min    Equipment Utilized During Treatment  Gait belt    Activity Tolerance  Patient tolerated treatment well    Behavior During Therapy  [facility] for tasks assessed/performed       Past Medical History:  Diagnosis Date  . Arthritis   . Barrett esophagus   . Bronchitis   . Carotid artery occlusion   . COPD (chronic obstructive pulmonary disease) ([place])   . Diabetes mellitus without complication ([place])   . GERD (gastroesophageal reflux disease)   . Headache    migraines  . History of thyroid cancer 2008  . Hypertension   . Restless leg   . Sleep apnea with use of continuous positive airway pressure (CPAP)    [facility] , AHI  77cn central and obstrcutive. 16 cm water , 3 cm EPR.   . Thoracic ascending aortic aneurysm (HCC)    4.2 cm ascending TAA 08/18/17 CTA. Annual imaging recommended.  [name] Ulcer    Peptic ulcer disease    Past Surgical History:  Procedure Laterality Date  . ANTERIOR CERVICAL DECOMP/DISCECTOMY FUSION N/A 09/03/2017   Procedure: ACDF - C3-C4 - C4-C5 - C5-C6;  Surgeon: Kary Kos, MD;  Location: [facility];  Service: Neurosurgery;  Laterality: N/A;  . CAROTID ENDARTERECTOMY Left January 03, 2006   Dr. Amedeo Plenty  . COLONOSCOPY    . EYE SURGERY Bilateral    cataract surgery with lens implants  . game keepers thumb Right   . JOINT REPLACEMENT Left    knee X 2  . ROTATOR CUFF REPAIR  Right   . THYROIDECTOMY  2008    There were no vitals filed for this visit.  Subjective Assessment - 05/30/19 1450    Subjective  Pt reports that he did not sleep well last night. Reports right leg was jumping on him.    Pertinent History  PMH:  78 y.o. male with past medical history significant for coronary artery disease, COPD not on home oxygen, diabetes, restless leg syndrome, OSA on CPAP, CHF. Anterior cervical decomp/disectomy 08/2017    Patient Stated Goals  Pt wants to walk.    Currently in Pain?  No/denies                       [facility] Adult PT Treatment/Exercise - 05/30/19 1538      Bed Mobility   Bed Mobility  Rolling Right;Rolling Left;Supine to Sit;Sit to Supine    Rolling Right  Supervision/verbal cueing    Rolling Left  Supervision/Verbal cueing    Supine to Sit  Minimal Assistance - Patient > 75%    Sit to Supine  Supervision/Verbal cueing      Transfers   Transfers  Squat Pivot Transfers;Sit to Stand;Stand to Sit    Sit to Stand  4:  Min guard    Sit to Stand Details  Verbal cues for technique    Sit to Stand Details (indicate cue type and reason)  Pt pushed through walker to rise from mat despite cuing to push with one hadn on mat. Heavy UE support to rise.    Stand to Sit  4: Min guard    Stand to Sit Details (indicate cue type and reason)  Verbal cues for technique    Squat Pivot Transfers  5: Supervision    Squat Pivot Transfer Details (indicate cue type and reason)  transport chair to/from mat with increase scoots to perform      Neuro Re-ed    Neuro Re-ed Details   Pt stood at walker x 2-3 min at end of session after stretching with trying to get more upright posture. Did note improved upright posture with cuing to look up. Able to let go briefly x 2 attempts. Weight shift side to side x 2 but pt reporting right knee just doesn't feel like with hold him.       Exercises   Exercises  Other Exercises;Knee/Hip    Other Exercises   Pt performed  seated LAQ x 10 bilateral with partial range on right, hip flexion x 10 bilateral. Attempted right hamstring stretch seated but unable to get knee straight. PT had patient lay on mat supine to stretch out hip flexors and assess hamstring length. Pt able to fully extend left knee but lacking about 30 degrees on right. PT performed passive right hamstring stretch pushing through thigh. Pt did have issues with tone kicking in with spasms when trying to straighten out with some low back pain. PT performed sidelying hip flexor stretch 30 sec x 3 each side. Right lacking about 20 degrees from neutral and left lacking about 10 degrees. Wife observing throughout. PT demonstrated how she used her hip to stabilize trunk when stretching and to move slowly to prevent tone from kicking in so much. Pt performed sidelying clamshell x 10 right but developed pain in low back as went on and PT having to stabilize on right, performed 10 on left with good form.       Knee/Hip Exercises: Aerobic   Other Aerobic  Sci Fit x 6 min level 5.7 with legs only and PT stabilizing transport chair so it would not tip.             PT Education - 05/30/19 1805    Education Details  Added to HEP. Instructed patient to spend 2 times/day stretching out in supine in either bed or recliner x 10-15 min to help stretch out hip flexors as sitting upright all day not helping.    Person(s) Educated  Patient;Spouse    Methods  Explanation;Demonstration;Handout    Comprehension  Verbalized understanding;Returned demonstration       PT Short Term Goals - 05/09/19 1539      PT SHORT TERM GOAL #1   Title  Pt will be instructed in HEP for strengthening and balance activities to progress functional mobility on own safely with caregiver assist.    Baseline  05/09/19: met with current program. Adding hamstring stretch and standing at sink.    Status  Achieved    Target Date  05/11/19      PT SHORT TERM GOAL #2   Title  Pt will be able to  perform sit to stand transfers from varied surfaces supervision for improved mobility.    Baseline  05/09/19: continues to need  assistance at times to achieve standing    Time  --    Period  --    Status  Not Met    Target Date  05/11/19      PT SHORT TERM GOAL #3   Title  Pt will ambulate 37' with FWW CGA for improved mobility in home.    Baseline  05/09/19: unable to ambulate this session due to knee bucking.    Time  --    Period  --    Status  Not Met        PT Long Term Goals - 04/11/19 1620      PT LONG TERM GOAL #1   Title  Pt will increase 30 sec sit to stand from 4 reps edge of mat to 6 or more for improved functional strength and mobility.    Baseline  4 reps on 04/11/19    Time  8    Period  Weeks    Status  New    Target Date  06/10/19      PT LONG TERM GOAL #2   Title  Pt will be able to ambulate >100' with FWW supervision for improved household distances.    Time  8    Period  Weeks    Status  New    Target Date  06/10/19      PT LONG TERM GOAL #3   Title  Pt will be able to maintain standing x 5 min with minimal UE support for improved balance to assist with ADLs.    Time  8    Period  Weeks    Status  New    Target Date  06/10/19            Plan - 05/30/19 1806    Clinical Impression Statement  Pt was able to assume more upright posture in standing after stretching today. Pt needs to spend more time stretching out in bed during day and performing standing to help stretch hip flexors. Pt's right knee still limiting gait at this time.    Personal Factors and Comorbidities  Comorbidity 3+;Age    Examination-Activity Limitations  Bathing;Locomotion Level;Transfers;Stand    Examination-Participation Restrictions  Community Activity    Stability/Clinical Decision Making  Evolving/Moderate complexity    Rehab Potential  Good    PT Frequency  1x / week    PT Duration  8 weeks    PT Treatment/Interventions  ADLs/Self Care Home Management;Cryotherapy;DME  Instruction;Moist Heat;Gait training;Stair training;Functional mobility training;Therapeutic activities;Therapeutic exercise;Balance training;Patient/family education;Neuromuscular re-education;Manual techniques;Passive range of motion    PT Next Visit Plan  Continue with transfer training, strengthening/stretching to help with more upright posture, standing balance. Gait as able    PT Home Exercise Plan  [email or web address]    Consulted and Agree with Plan of Care  Patient;Family member/caregiver    Family Member Consulted  wife       Patient will benefit from skilled therapeutic intervention in order to improve the following deficits and impairments:  Abnormal gait, Cardiopulmonary status limiting activity, Decreased activity tolerance, Decreased balance, Difficulty walking, Increased edema, Impaired sensation, Impaired flexibility, Impaired UE functional use, Postural dysfunction, Pain  Visit Diagnosis: Muscle weakness (generalized)  Abnormal posture     Problem List Patient Active Problem List   Diagnosis Date Noted  . PVD (peripheral vascular disease) ([place]) 02/14/2019  . Essential hypertension 02/13/2019  . Non-insulin treated type 2 diabetes mellitus ([place]) 02/13/2019  . Severe sepsis ([place]) 01/17/2019  . Community acquired  pneumonia of right lower lobe of lung 01/17/2019  . Postural urinary incontinence 05/20/2018  . 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 Myoclonic jerking 05/20/2018  . 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 Acute respiratory failure with hypoxemia ([place]) 08/18/2017  . Hypokalemia 08/18/2017  . Leukocytosis 08/18/2017  . Muscle atrophy of lower extremity 08/09/2017  . Bowel and bladder incontinence 08/09/2017  . Right-sided muscle weakness 08/09/2017  . Neuropathy 08/09/2017  . 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/2017  . Abnormality of gait 07/12/2017  .  Myoclonic jerkings, 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/2017  . Acquired right foot drop 07/12/2017  . UTI (urinary tract infection) due to Enterococcus 07/12/2017  . Pressure injury of skin 06/21/2017  . Acute cystitis 06/20/2017  . Generalized weakness 06/20/2017  . Dehydration 06/20/2017  . Dyspnea 12/26/2016  . Chronic cough 12/26/2016  . Hypersomnia with sleep apnea 10/30/2016  . CSA (central sleep apnea) 10/30/2016  . Wheezing symptom 10/30/2016  . 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 RLS (restless legs syndrome) 08/25/2014  . Primary gout 08/25/2014  . OSA on CPAP 08/25/2014  . Severe obesity (BMI >= 40) ([place]) 08/25/2014  . Obesity (BMI 30-39.9) 02/18/2013  . 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 S/P TKR (total knee replacement) 11/29/2012  . History of thyroid cancer     Electa Sniff, PT, DPT, NCS 05/30/2019, 6:10 PM  [facility] [address] [place], [place], [address] Phone: [phone]   Fax:  [phone]  Name: AZAREL BANNER MRN: [id number] Date of Birth: [birth year]

## 2019-06-06 ENCOUNTER — Other Ambulatory Visit: Payer: Self-pay

## 2019-06-06 ENCOUNTER — Ambulatory Visit: Payer: Medicare Other

## 2019-06-06 DIAGNOSIS — M6281 Muscle weakness (generalized): Secondary | ICD-10-CM

## 2019-06-06 DIAGNOSIS — R293 Abnormal posture: Secondary | ICD-10-CM

## 2019-06-06 DIAGNOSIS — R2689 Other abnormalities of gait and mobility: Secondary | ICD-10-CM

## 2019-06-06 NOTE — Therapy (Signed)
[facility] [address] [place], [place], [address] Phone: [phone]   Fax:  [phone]  Physical Therapy Treatment/recert  Patient Details  Name: [name] MRN: [id number] Date of Birth: March 11, 1941 Referring Provider (PT): [name]   Encounter Date: 06/06/2019  PT End of Session - 06/06/19 1449    Visit Number  5    Number of Visits  13    Date for PT Re-Evaluation  08/15/19    Authorization Type  [employer] medicare 10th visit progress note    PT Start Time  1446    PT Stop Time  1530    PT Time Calculation (min)  44 min    Equipment Utilized During Treatment  Gait belt    Activity Tolerance  Patient tolerated treatment well    Behavior During Therapy  [facility] for tasks assessed/performed       Past Medical History:  Diagnosis Date  . Arthritis   . Barrett esophagus   . Bronchitis   . Carotid artery occlusion   . COPD (chronic obstructive pulmonary disease) ([place])   . Diabetes mellitus without complication ([place])   . GERD (gastroesophageal reflux disease)   . Headache    migraines  . History of thyroid cancer 2008  . Hypertension   . Restless leg   . Sleep apnea with use of continuous positive airway pressure (CPAP)    [facility] , AHI  77cn central and obstrcutive. 16 cm water , 3 cm EPR.   . Thoracic ascending aortic aneurysm (HCC)    4.2 cm ascending TAA 08/18/17 CTA. Annual imaging recommended.  [name] Ulcer    Peptic ulcer disease    Past Surgical History:  Procedure Laterality Date  . ANTERIOR CERVICAL DECOMP/DISCECTOMY FUSION N/A 09/03/2017   Procedure: ACDF - C3-C4 - C4-C5 - C5-C6;  Surgeon: Kary Kos, MD;  Location: [facility];  Service: Neurosurgery;  Laterality: N/A;  . CAROTID ENDARTERECTOMY Left January 03, 2006   Dr. Amedeo Plenty  . COLONOSCOPY    . EYE SURGERY Bilateral    cataract surgery with lens implants  . game keepers thumb Right   . JOINT REPLACEMENT Left    knee X 2  . ROTATOR  CUFF REPAIR Right   . THYROIDECTOMY  2008    There were no vitals filed for this visit.  Subjective Assessment - 06/06/19 1449    Subjective  Pt reports that he is doing better today than last week. Has been able to weigh daily and staying 198#.    Pertinent History  PMH:  78 y.o. male with past medical history significant for coronary artery disease, COPD not on home oxygen, diabetes, restless leg syndrome, OSA on CPAP, CHF. Anterior cervical decomp/disectomy 08/2017    Patient Stated Goals  Pt wants to walk.    Currently in Pain?  No/denies                       [facility] Adult PT Treatment/Exercise - 06/06/19 1538      Transfers   Transfers  Sit to Stand;Stand to Sit    Sit to Stand  4: Min guard    Sit to Stand Details  Verbal cues for technique    Five time sit to stand comments   Performed sit to stand x 5 at // bar with verbal cues to push from chair with one hand close SBA.    Stand to Sit  4: Min guard  Ambulation/Gait   Ambulation/Gait  Yes    Ambulation/Gait Assistance  4: Min assist    Ambulation/Gait Assistance Details  PT stabilizing on right knee with gait with wife following with transport chair. Verbal cues to for erect posture.     Ambulation Distance (Feet)  6 Feet   x 3 in // bars   Assistive device  --   //bars   Gait Pattern  Step-to pattern;Decreased step length - right;Decreased step length - left    Ambulation Surface  Level;Indoor      Neuro Re-ed    Neuro Re-ed Details   Standing at // bars with verbal cues for upright posture x 2 min today. Pt able to get more erect. Weight shifting side to side x 10 standing. Pt does not trust right knee.      Knee/Hip Exercises: Aerobic   Other Aerobic  Sci Fit x 7 min at level 6.2 with legs only with PT stabilizing transport chair as tends to tip back.             PT Education - 06/06/19 1541    Education Details  Pt instructed to increase sit to stand to 5 if feeling up to it at home. Also  encouraged to stand at counter every 2 hours during day as long as he can. Did 2 min in clinic today.    Person(s) Educated  Patient;Spouse    Methods  Explanation;Demonstration    Comprehension  Verbalized understanding       PT Short Term Goals - 06/06/19 1859      PT SHORT TERM GOAL #1   Title  Pt will be instructed in HEP for strengthening and balance activities to progress functional mobility on own safely with caregiver assist.    Baseline  05/09/19: met with current program. Adding hamstring stretch and standing at sink.    Status  Achieved    Target Date  05/11/19      PT SHORT TERM GOAL #2   Title  Pt will be able to perform sit to stand transfers from varied surfaces supervision for improved mobility.    Baseline  CGA with sit to stand at // bars and walker.    Time  4    Period  Weeks    Status  On-going    Target Date  07/18/19   4 weeks from next visit due to delay in schedule     PT SHORT TERM GOAL #3   Title  Pt will ambulate 65' with FWW CGA for improved mobility in home.    Baseline  6' in // bars min assist    Time  4    Period  Weeks    Status  On-going    Target Date  07/18/19        PT Long Term Goals - 06/06/19 1902      PT LONG TERM GOAL #1   Title  Pt will increase 30 sec sit to stand from 4 reps edge of mat to 6 or more for improved functional strength and mobility.    Baseline  not reassessed    Time  8    Period  Weeks    Status  On-going    Target Date  08/15/19   dated extended due to delay in scheduling     PT LONG TERM GOAL #2   Title  Pt will be able to ambulate >100' with FWW supervision for improved household distances.    Time  8    Period  Weeks    Status  On-going    Target Date  08/15/19      PT LONG TERM GOAL #3   Title  Pt will be able to maintain standing x 5 min with minimal UE support for improved balance to assist with ADLs.    Time  8    Period  Weeks    Status  On-going    Target Date  08/15/19             Plan - 06/06/19 1904    Clinical Impression Statement  Pt with more upright posture in standing today. Pt denied any pain in right knee but does not trust knee. Pt needed encouragement to try gait in // bars. Able to ambulate with min assist of PT to stabilize at right knee in // bars. Right knee weakness has been biggest limiter during sessions. Pt also appears to be fearful of falling. Pt tolerating more activity during session today. Pt has had limited sessions this cert period. Will benefit from continued PT to continue to work at improving strength, balance and functional mobility.    Personal Factors and Comorbidities  Comorbidity 3+;Age    Examination-Activity Limitations  Bathing;Locomotion Level;Transfers;Stand    Examination-Participation Restrictions  Community Activity    Stability/Clinical Decision Making  Evolving/Moderate complexity    Rehab Potential  Good    PT Frequency  1x / week    PT Duration  8 weeks    PT Treatment/Interventions  ADLs/Self Care Home Management;Cryotherapy;DME Instruction;Moist Heat;Gait training;Stair training;Functional mobility training;Therapeutic activities;Therapeutic exercise;Balance training;Patient/family education;Neuromuscular re-education;Manual techniques;Passive range of motion    PT Next Visit Plan  Continue with transfer training, strengthening/stretching to help with more upright posture, standing balance. Gait as able    PT Home Exercise Plan  [email or web address]    Consulted and Agree with Plan of Care  Patient;Family member/caregiver    Family Member Consulted  wife       Patient will benefit from skilled therapeutic intervention in order to improve the following deficits and impairments:  Abnormal gait, Cardiopulmonary status limiting activity, Decreased activity tolerance, Decreased balance, Difficulty walking, Increased edema, Impaired sensation, Impaired flexibility, Impaired UE functional use, Postural dysfunction, Pain  Visit  Diagnosis: Muscle weakness (generalized)  Abnormal posture  Other abnormalities of gait and mobility     Problem List Patient Active Problem List   Diagnosis Date Noted  . PVD (peripheral vascular disease) ([place]) 02/14/2019  . Essential hypertension 02/13/2019  . Non-insulin treated type 2 diabetes mellitus ([place]) 02/13/2019  . Severe sepsis ([place]) 01/17/2019  . Community acquired pneumonia of right lower lobe of lung 01/17/2019  . Postural urinary incontinence 05/20/2018  . 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 Myoclonic jerking 05/20/2018  . 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 Acute respiratory failure with hypoxemia ([place]) 08/18/2017  . Hypokalemia 08/18/2017  . Leukocytosis 08/18/2017  . Muscle atrophy of lower extremity 08/09/2017  . Bowel and bladder incontinence 08/09/2017  . Right-sided muscle weakness 08/09/2017  . Neuropathy 08/09/2017  . 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/2017  . Abnormality of gait 07/12/2017  . Myoclonic jerkings, 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/2017  . Acquired right foot drop 07/12/2017  . UTI (urinary tract infection) due to Enterococcus 07/12/2017  . Pressure injury of skin 06/21/2017  . Acute cystitis 06/20/2017  . Generalized weakness 06/20/2017  . Dehydration 06/20/2017  . Dyspnea 12/26/2016  .  Chronic cough 12/26/2016  . Hypersomnia with sleep apnea 10/30/2016  . CSA (central sleep apnea) 10/30/2016  . Wheezing symptom 10/30/2016  . 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 RLS (restless legs syndrome) 08/25/2014  . Primary gout 08/25/2014  . OSA on CPAP 08/25/2014  . Severe obesity (BMI >= 40) ([place]) 08/25/2014  . Obesity (BMI 30-39.9) 02/18/2013  . 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 S/P TKR (total knee replacement) 11/29/2012  . History of thyroid cancer     Electa Sniff, PT, DPT, NCS 06/06/2019, 7:10 PM  [facility] [address] [place], [place], [address] Phone: [phone]   Fax:  [phone]  Name: [name] MRN: [id number] Date of Birth: 1940/08/10

## 2019-06-09 ENCOUNTER — Telehealth: Payer: Self-pay | Admitting: Adult Health

## 2019-06-09 NOTE — Telephone Encounter (Signed)
Patient called to inform that the baclofac is not working for him and is wanting to know what the next step would be.   Please follow up

## 2019-06-10 NOTE — Telephone Encounter (Signed)
Consulted with Dr. Brett Fairy- we can try Keppra 250 mg BID to see if this is helpful. Can order if patient is amendable to trying.

## 2019-06-10 NOTE — Telephone Encounter (Addendum)
I called pt back and relayed to take baclofen 10mg   po TID and keppra 250mg  BID, may take together.  Will let us know how he does in 2 wks or before if needed. Wife verbalized understanding.

## 2019-06-10 NOTE — Telephone Encounter (Signed)
Can decrease back to 10 mg TID. We can consider weaning off baclofen if keppra offers benefit

## 2019-06-10 NOTE — Telephone Encounter (Signed)
Pt is ok to try the keppra 250mg  po bid.  Would pt stop the baclofen or continue taking this?

## 2019-06-10 NOTE — Telephone Encounter (Signed)
I called pt and he has tried the increase of baclofen 10mg  po am , noon and 15mg  at bedtime.  This has not made a difference for him (he cannot sleep at night).  He is participating in PT.  What next. Please advise.

## 2019-06-11 MED ORDER — LEVETIRACETAM 250 MG PO TABS: 250.0000 mg | ORAL_TABLET | Freq: Two times a day (BID) | ORAL | 1 refills | Status: DC

## 2019-06-11 NOTE — Addendum Note (Signed)
Addended by: Brandon Melnick on: 06/11/2019 02:03 PM   Modules accepted: Orders

## 2019-06-23 ENCOUNTER — Other Ambulatory Visit: Payer: Self-pay | Admitting: Neurology

## 2019-06-23 ENCOUNTER — Telehealth: Payer: Self-pay

## 2019-06-23 NOTE — Telephone Encounter (Signed)
-----   Message from Erlene Quan, [place] sent at 06/16/2019 11:35 AM EST ----- T this patient can be offered a virtual appointment  [name] PA-C 06/16/2019 11:35 AM

## 2019-06-23 NOTE — Telephone Encounter (Signed)
Contacted patient and offered a virtual visit to prevent the spread of Covid. Patient was agreeable with with appt change. Verbal consent given.      Virtual Visit Pre-Appointment Phone Call  "[name], I am calling you today to discuss your upcoming appointment. We are currently trying to limit exposure to the virus that causes COVID-19 by seeing patients at home rather than in the office."  1. "What is the BEST phone number to call the day of the visit?" - include this in appointment notes  2. "Do you have or have access to (through a family member/friend) a smartphone with video capability that we can use for your visit?" a. If yes - list this number in appt notes as "cell" (if different from BEST phone #) and list the appointment type as a VIDEO visit in appointment notes b. If no - list the appointment type as a PHONE visit in appointment notes  3. Confirm consent - "In the setting of the current Covid19 crisis, you are scheduled for a (phone or video) visit with your provider on (date) at (time).  Just as we do with many in-office visits, in order for you to participate in this visit, we must obtain consent.  If you'd like, I can send this to your mychart (if signed up) or email for you to review.  Otherwise, I can obtain your verbal consent now.  All virtual visits are billed to your insurance company just like a normal visit would be.  By agreeing to a virtual visit, we'd like you to understand that the technology does not allow for your provider to perform an examination, and thus may limit your provider's ability to fully assess your condition. If your provider identifies any concerns that need to be evaluated in person, we will make arrangements to do so.  Finally, though the technology is pretty good, we cannot assure that it will always work on either your or our end, and in the setting of a video visit, we may have to convert it to a phone-only visit.  In either situation, we  cannot ensure that we have a secure connection.  Are you willing to proceed?" STAFF: Did the patient verbally acknowledge consent to telehealth visit? Document YES/NO here: YES  4. Advise patient to be prepared - "Two hours prior to your appointment, go ahead and check your blood pressure, pulse, oxygen saturation, and your weight (if you have the equipment to check those) and write them all down. When your visit starts, your provider will ask you for this information. If you have an Apple Watch or Kardia device, please plan to have heart rate information ready on the day of your appointment. Please have a pen and paper handy nearby the day of the visit as well."  5. Give patient instructions for MyChart download to smartphone OR Doximity/Doxy.me as below if video visit (depending on what platform provider is using)  6. Inform patient they will receive a phone call 15 minutes prior to their appointment time (may be from unknown caller ID) so they should be prepared to answer    TELEPHONE CALL NOTE  [name] has been deemed a candidate for a follow-up tele-health visit to limit community exposure during the Covid-19 pandemic. I spoke with the patient via phone to ensure availability of phone/video source, confirm preferred email & phone number, and discuss instructions and expectations.  I reminded [name] to be prepared with any vital sign and/or heart  rhythm information that could potentially be obtained via home monitoring, at the time of his visit. I reminded [name] to expect a phone call prior to his visit.  Harold Hedge, CMA 06/23/2019 11:56 AM   INSTRUCTIONS FOR DOWNLOADING THE MYCHART APP TO SMARTPHONE  - The patient must first make sure to have activated MyChart and know their login information - If Apple, go to [employer] and type in MyChart in the search bar and download the app. If Android, ask patient to go to [employer] and type in [place] in the search bar  and download the app. The app is free but as with any other app downloads, their phone may require them to verify saved payment information or Apple/Android password.  - The patient will need to then log into the app with their MyChart username and password, and select [facility] as their healthcare provider to link the account. When it is time for your visit, go to the MyChart app, find appointments, and click Begin Video Visit. Be sure to Select Allow for your device to access the Microphone and Camera for your visit. You will then be connected, and your provider will be with you shortly.  **If they have any issues connecting, or need assistance please contact MyChart service desk [phone])**  **If using a computer, in order to ensure the best quality for their visit they will need to use either of the following Internet Browsers: [employer], or Google Chrome**  IF USING DOXIMITY or DOXY.ME - The patient will receive a link just prior to their visit by text.     FULL LENGTH CONSENT FOR TELE-HEALTH VISIT   I hereby voluntarily request, consent and authorize [facility] and its employed or contracted physicians, physician assistants, nurse practitioners or other licensed health care professionals (the Practitioner), to provide me with telemedicine health care services (the "Services") as deemed necessary by the treating Practitioner. I acknowledge and consent to receive the Services by the Practitioner via telemedicine. I understand that the telemedicine visit will involve communicating with the Practitioner through live audiovisual communication technology and the disclosure of certain medical information by electronic transmission. I acknowledge that I have been given the opportunity to request an in-person assessment or other available alternative prior to the telemedicine visit and am voluntarily participating in the telemedicine visit.  I understand that I have the  right to withhold or withdraw my consent to the use of telemedicine in the course of my care at any time, without affecting my right to future care or treatment, and that the Practitioner or I may terminate the telemedicine visit at any time. I understand that I have the right to inspect all information obtained and/or recorded in the course of the telemedicine visit and may receive copies of available information for a reasonable fee.  I understand that some of the potential risks of receiving the Services via telemedicine include:  [name] Delay or interruption in medical evaluation due to technological equipment failure or disruption; . Information transmitted may not be sufficient (e.g. poor resolution of images) to allow for appropriate medical decision making by the Practitioner; and/or  . In rare instances, security protocols could fail, causing a breach of personal health information.  Furthermore, I acknowledge that it is my responsibility to provide information about my medical history, conditions and care that is complete and accurate to the best of my ability. I acknowledge that Practitioner's advice, recommendations, and/or decision may be based  on factors not within their control, such as incomplete or inaccurate data provided by me or distortions of diagnostic images or specimens that may result from electronic transmissions. I understand that the practice of medicine is not an exact science and that Practitioner makes no warranties or guarantees regarding treatment outcomes. I acknowledge that I will receive a copy of this consent concurrently upon execution via email to the email address I last provided but may also request a printed copy by calling the office of [facility].    I understand that my insurance will be billed for this visit.   I have read or had this consent read to me. . I understand the contents of this consent, which adequately explains the benefits and risks of the Services  being provided via telemedicine.  . I have been provided ample opportunity to ask questions regarding this consent and the Services and have had my questions answered to my satisfaction. . I give my informed consent for the services to be provided through the use of telemedicine in my medical care  By participating in this telemedicine visit I agree to the above.

## 2019-06-25 ENCOUNTER — Encounter: Payer: Self-pay | Admitting: Cardiology

## 2019-06-25 ENCOUNTER — Telehealth: Payer: Self-pay

## 2019-06-25 ENCOUNTER — Ambulatory Visit: Payer: Medicare Other

## 2019-06-25 ENCOUNTER — Telehealth (INDEPENDENT_AMBULATORY_CARE_PROVIDER_SITE_OTHER): Payer: Medicare Other | Admitting: Cardiology

## 2019-06-25 VITALS — BP 101/63 | Ht 67.0 in | Wt 198.0 lb

## 2019-06-25 DIAGNOSIS — I739 Peripheral vascular disease, unspecified: Secondary | ICD-10-CM

## 2019-06-25 DIAGNOSIS — E119 Type 2 diabetes mellitus without complications: Secondary | ICD-10-CM

## 2019-06-25 DIAGNOSIS — G4733 Obstructive sleep apnea (adult) (pediatric): Secondary | ICD-10-CM

## 2019-06-25 DIAGNOSIS — Z8709 Personal history of other diseases of the respiratory system: Secondary | ICD-10-CM

## 2019-06-25 DIAGNOSIS — M4802 Spinal stenosis, cervical region: Secondary | ICD-10-CM

## 2019-06-25 DIAGNOSIS — Z9989 Dependence on other enabling machines and devices: Secondary | ICD-10-CM

## 2019-06-25 DIAGNOSIS — G992 Myelopathy in diseases classified elsewhere: Secondary | ICD-10-CM

## 2019-06-25 NOTE — Progress Notes (Signed)
Virtual Visit via Telephone Note   This visit type was conducted due to national recommendations for restrictions regarding the COVID-19 Pandemic (e.g. social distancing) in an effort to limit this patient's exposure and mitigate transmission in our community.  Due to his co-morbid illnesses, this patient is at least at moderate risk for complications without adequate follow up.  This format is felt to be most appropriate for this patient at this time.  The patient did not have access to video technology/had technical difficulties with video requiring transitioning to audio format only (telephone).  All issues noted in this document were discussed and addressed.  No physical exam could be performed with this format.  Please refer to the patient's chart for his  consent to telehealth for [facility].   Date:  06/25/2019   ID:  [name], DOB 1940-08-18, MRN [id number]  Patient Location: Home Provider Location: Home  PCP:  Cesar Battles, MD  Cardiologist:  Cesar Breeding, MD  Electrophysiologist:  None   Evaluation Performed:  Follow-Up Visit  Chief Complaint:  none  History of Present Illness:    [name] is a 79 y.o. male with a hx of cervical myelopathy.  This was apparently diagnosed in March 2020.  He ultimately underwent surgery 09/05/2018 and has been recovering with physical therapy at home.    He was admitted to the hospital 01/17/2019 with acute respiratory failure. Ultimately this was felt to be a combination of community-acquired pneumonia and diastolic heart failure.  the patient had not previously had any diastolic heart failure issues and has no history of coronary disease.  He did have an incidental finding of coronary artery disease on a chest CT during that admission. Echocardiogram revealed an EF of 50 to 55%.  Diastolic parameters were indeterminate.  He did not have LVH.  He did have moderate left atrial enlargement.  The patient diuresed 15 L according to  hospital records.  His weight in the office in Aug was 198 pounds.    Since discharge he did require an increase in his lasix for a few days in October but otherwise has been doing well from a cardiac standpoint.  He was contacted today for a follow up.  He tells me he has "good days and bad days" in reference to his myelopathy.  His weigh has remained stable and he denies any new dyspnea. His B/P runs borderline low but he denies orthopnea.  Dr Sharlett Iles has been following his labs.   The patient does not have symptoms concerning for COVID-19 infection (fever, chills, cough, or new shortness of breath).    Past Medical History:  Diagnosis Date  . Arthritis   . Barrett esophagus   . Bronchitis   . Carotid artery occlusion   . COPD (chronic obstructive pulmonary disease) ([place])   . Diabetes mellitus without complication ([place])   . GERD (gastroesophageal reflux disease)   . Headache    migraines  . History of thyroid cancer 2008  . Hypertension   . Restless leg   . Sleep apnea with use of continuous positive airway pressure (CPAP)    2011 [facility] , AHI  77cn central and obstrcutive. 16 cm water , 3 cm EPR.   . Thoracic ascending aortic aneurysm (HCC)    4.2 cm ascending TAA 08/18/17 CTA. Annual imaging recommended.  [name] Ulcer    Peptic ulcer disease   Past Surgical History:  Procedure Laterality Date  . ANTERIOR CERVICAL DECOMP/DISCECTOMY FUSION N/A 09/03/2017  Procedure: ACDF - C3-C4 - C4-C5 - C5-C6;  Surgeon: Kary Kos, MD;  Location: [facility];  Service: Neurosurgery;  Laterality: N/A;  . CAROTID ENDARTERECTOMY Left January 03, 2006   Dr. Amedeo Plenty  . COLONOSCOPY    . EYE SURGERY Bilateral    cataract surgery with lens implants  . game keepers thumb Right   . JOINT REPLACEMENT Left    knee X 2  . ROTATOR CUFF REPAIR Right   . THYROIDECTOMY  2008     Current Meds  Medication Sig  . acetaminophen (TYLENOL) 325 MG tablet Take 2 tablets (650 mg total) by mouth every 4 (four) hours  as needed for mild pain ((score 1 to 3) or temp > 100.5).  [name] allopurinol (ZYLOPRIM) 300 MG tablet Take 300 mg by mouth daily.   . baclofen (LIORESAL) 10 MG tablet TAKE 1 TABLET BY MOUTH 3 TIMES DAILY.  . calcium citrate-vitamin D (CITRACAL+D) 315-200 MG-UNIT per tablet Take 1 tablet by mouth daily. 750 mg   . carbidopa-levodopa (SINEMET IR) 10-100 MG tablet Take 1 tablet by mouth 2 (two) times daily.   . cholecalciferol (VITAMIN D) 1000 units tablet Take 1,000 Units by mouth daily.   . furosemide (LASIX) 40 MG tablet Take 1 tablet (40 mg total) by mouth daily.  [name] guaifenesin (HUMIBID E) 400 MG TABS tablet Take 400 mg by mouth daily.   [name] HYDROcodone-acetaminophen (NORCO/VICODIN) 5-325 MG tablet Take 1 tablet by mouth 3 (three) times daily as needed (pain). (Patient taking differently: Take 1 tablet by mouth daily as needed for moderate pain (pain). )  . ipratropium-albuterol (DUONEB) 0.5-2.5 (3) MG/3ML SOLN Take 3 mLs by nebulization every 6 (six) hours as needed (cough).  [name] levothyroxine (SYNTHROID) 175 MCG tablet Take 125 mcg by mouth daily before breakfast.   . metFORMIN (GLUCOPHAGE-XR) 500 MG 24 hr tablet Take 500 mg by mouth 2 (two) times daily.   . metoprolol succinate (TOPROL-XL) 50 MG 24 hr tablet Take 1 tablet (50 mg total) by mouth daily. Take with or immediately following a meal.  . Multiple Vitamin (MULTIVITAMIN) tablet Take 1 tablet by mouth daily.  [name] oxymetazoline (AFRIN) 0.05 % nasal spray Place 2 sprays into the nose 2 (two) times daily as needed for congestion.   . pantoprazole (PROTONIX) 40 MG tablet Take 40 mg by mouth every evening.  . polyethylene glycol powder (MIRALAX) 17 GM/SCOOP powder Take by mouth as directed. 17 gm every other day  . rizatriptan (MAXALT) 10 MG tablet Take 10 mg by mouth as needed for migraine.   . simvastatin (ZOCOR) 40 MG tablet Take 40 mg by mouth daily at 6 PM.   . spironolactone (ALDACTONE) 25 MG tablet 25 mg daily.   . TRAVATAN Z 0.004 % SOLN  ophthalmic solution Place 1 drop into both eyes at bedtime.   . TRELEGY ELLIPTA 100-62.5-25 MCG/INH AEPB Inhale 1 puff into the lungs daily.   . VENTOLIN HFA 108 (90 Base) MCG/ACT inhaler Inhale 1-2 puffs into the lungs every 4 (four) hours as needed for wheezing or shortness of breath.      Allergies:   Patient has no known allergies.   Social History   Tobacco Use  . Smoking status: Former Smoker    Years: 40.00    Types: Cigars, Cigarettes    Quit date: 06/19/2005    Years since quitting: 14.0  . Smokeless tobacco: Never Used  . Tobacco comment: smoked 4-6 cigars daily, only smoked cigarettes socially  Substance  Use Topics  . Alcohol use: No  . Drug use: No     Family Hx: The patient's family history includes COPD in his mother; Cancer in his father; Diabetes in his father; Hyperlipidemia in his father and mother; Hypertension in his father and mother; Kidney disease in his father.  ROS:   Please see the history of present illness.    All other systems reviewed and are negative.   Prior CV studies:   The following studies were reviewed today: Echo 01/19/2019  Labs/Other Tests and Data Reviewed:    EKG:  No ECG reviewed.  Recent Labs: 01/24/2019: ALT 21 01/28/2019: Magnesium 2.7 02/14/2019: Hemoglobin 12.5; NT-Pro BNP 482; Platelets 190 04/07/2019: BUN 22; Creatinine, Ser 1.04; Potassium 4.7; Sodium 146   Recent Lipid Panel No results found for: CHOL, TRIG, HDL, CHOLHDL, LDLCALC, LDLDIRECT  Wt Readings from Last 3 Encounters:  06/25/19 198 lb (89.8 kg)  04/07/19 204 lb 3.2 oz (92.6 kg)  02/14/19 198 lb (89.8 kg)     Objective:    Vital Signs:  BP 101/63   Ht 5\' 7"  (1.702 m)   Wt 198 lb (89.8 kg)   SpO2 95%   BMI 31.01 kg/m    VITAL SIGNS:  reviewed  ASSESSMENT & PLAN:    History of respiratory failure with hypoxemia  Admitted 01/16/2019- 01/28/2019 with combined COPD and diastolic CHF. Echo showed EF of 50-55%. 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  He is in a wheel chair but is able to ambulate some at home.   Community acquired pneumonia of right lower lobe of lung ([place]) Hospitalized 7/31-8/04/2019  OSA on CPAP C-pap and O2 at night  PVD (peripheral vascular disease) (HCC) Lt ABI - 0.62, Rt 0.89. No claudication symptoms  Plan:  I suggested he decrease his Aldactone to 12.5 mg daily if his systolic B/P drops below [id number].   COVID-19 Education: The signs and symptoms of COVID-19 were discussed with the patient and how to seek care for testing (follow up with PCP or arrange E-visit).  The importance of social distancing was discussed today.  Time:   Today, I have spent 15 minutes with the patient with telehealth technology discussing the above problems.     Medication Adjustments/Labs and Tests Ordered: Current medicines are reviewed at length with the patient today.  Concerns regarding medicines are outlined above.   Tests Ordered: No orders of the defined types were placed in this encounter.   Medication Changes: No orders of the defined types were placed in this encounter.   Follow Up:  In Person Dr Percival Spanish 6 months  Signed, Kerin Ransom, PA-C  06/25/2019 11:36 AM    [facility]

## 2019-06-25 NOTE — Telephone Encounter (Signed)
Called patient to discuss AVS instructions gave [name]'s recommendations and patient voiced understanding. AVS summary mailed to patient.    

## 2019-06-25 NOTE — Patient Instructions (Addendum)
Medication Instructions:   DECREASE Spironolactone (Aldactone) to 12.5 mg if your top number of your blood pressure drops below 100 *If you need a refill on your cardiac medications before your next appointment, please call your pharmacy*  Lab Work: NONE ordered at this time of appointment   If you have labs (blood work) drawn today and your tests are completely normal, you will receive your results only by: [name] MyChart Message (if you have MyChart) OR . A paper copy in the mail If you have any lab test that is abnormal or we need to change your treatment, we will call you to review the results.  Testing/Procedures: NONE ordered at this time of appointment   Follow-Up: At [facility], you and your health needs are our priority.  As part of our continuing mission to provide you with exceptional heart care, we have created designated Provider Care Teams.  These Care Teams include your primary Cardiologist (physician) and Advanced Practice Providers (APPs -  Physician Assistants and Nurse Practitioners) who all work together to provide you with the care you need, when you need it.  Your next appointment:   6 month(s)  The format for your next appointment:   In Person  Provider:   Minus Breeding, MD  Other Instructions  You will need to limit your dietary Sodium intake to 2-4 grams     Low-Sodium Eating Plan Sodium, which is an element that makes up salt, helps you maintain a healthy balance of fluids in your body. Too much sodium can increase your blood pressure and cause fluid and waste to be held in your body. Your health care provider or dietitian may recommend following this plan if you have high blood pressure (hypertension), kidney disease, liver disease, or heart failure. Eating less sodium can help lower your blood pressure, reduce swelling, and protect your heart, liver, and kidneys. What are tips for following this plan? General guidelines  Most people on this plan  should limit their sodium intake to 1,500-2,000 mg (milligrams) of sodium each day. Reading food labels   The Nutrition Facts label lists the amount of sodium in one serving of the food. If you eat more than one serving, you must multiply the listed amount of sodium by the number of servings.  Choose foods with less than 140 mg of sodium per serving.  Avoid foods with 300 mg of sodium or more per serving. Shopping  Look for lower-sodium products, often labeled as "low-sodium" or "no salt added."  Always check the sodium content even if foods are labeled as "unsalted" or "no salt added".  Buy fresh foods. ? Avoid canned foods and premade or frozen meals. ? Avoid canned, cured, or processed meats  Buy breads that have less than 80 mg of sodium per slice. Cooking  Eat more home-cooked food and less restaurant, buffet, and fast food.  Avoid adding salt when cooking. Use salt-free seasonings or herbs instead of table salt or sea salt. Check with your health care provider or pharmacist before using salt substitutes.  Cook with plant-based oils, such as canola, sunflower, or olive oil. Meal planning  When eating at a restaurant, ask that your food be prepared with less salt or no salt, if possible.  Avoid foods that contain MSG (monosodium glutamate). MSG is sometimes added to [place] food, bouillon, and some canned foods. What foods are recommended? The items listed may not be a complete list. Talk with your dietitian about what dietary choices are best for  you. Grains Low-sodium cereals, including oats, puffed wheat and rice, and shredded wheat. Low-sodium crackers. Unsalted rice. Unsalted pasta. Low-sodium bread. Whole-grain breads and whole-grain pasta. Vegetables Fresh or frozen vegetables. "No salt added" canned vegetables. "No salt added" tomato sauce and paste. Low-sodium or reduced-sodium tomato and vegetable juice. Fruits Fresh, frozen, or canned fruit. Fruit juice. Meats  and other protein foods Fresh or frozen (no salt added) meat, poultry, seafood, and fish. Low-sodium canned tuna and salmon. Unsalted nuts. Dried peas, beans, and lentils without added salt. Unsalted canned beans. Eggs. Unsalted nut butters. Dairy Milk. Soy milk. Cheese that is naturally low in sodium, such as ricotta cheese, fresh mozzarella, or Swiss cheese Low-sodium or reduced-sodium cheese. Cream cheese. Yogurt. Fats and oils Unsalted butter. Unsalted margarine with no trans fat. Vegetable oils such as canola or olive oils. Seasonings and other foods Fresh and dried herbs and spices. Salt-free seasonings. Low-sodium mustard and ketchup. Sodium-free salad dressing. Sodium-free light mayonnaise. Fresh or refrigerated horseradish. Lemon juice. Vinegar. Homemade, reduced-sodium, or low-sodium soups. Unsalted popcorn and pretzels. Low-salt or salt-free chips. What foods are not recommended? The items listed may not be a complete list. Talk with your dietitian about what dietary choices are best for you. Grains Instant hot cereals. Bread stuffing, pancake, and biscuit mixes. Croutons. Seasoned rice or pasta mixes. Noodle soup cups. Boxed or frozen macaroni and cheese. Regular salted crackers. Self-rising flour. Vegetables Sauerkraut, pickled vegetables, and relishes. Olives. [place] fries. Onion rings. Regular canned vegetables (not low-sodium or reduced-sodium). Regular canned tomato sauce and paste (not low-sodium or reduced-sodium). Regular tomato and vegetable juice (not low-sodium or reduced-sodium). Frozen vegetables in sauces. Meats and other protein foods Meat or fish that is salted, canned, smoked, spiced, or pickled. Bacon, ham, sausage, hotdogs, corned beef, chipped beef, packaged lunch meats, salt pork, jerky, pickled herring, anchovies, regular canned tuna, sardines, salted nuts. Dairy Processed cheese and cheese spreads. Cheese curds. Blue cheese. Feta cheese. String cheese. Regular  cottage cheese. Buttermilk. Canned milk. Fats and oils Salted butter. Regular margarine. Ghee. Bacon fat. Seasonings and other foods Onion salt, garlic salt, seasoned salt, table salt, and sea salt. Canned and packaged gravies. Worcestershire sauce. Tartar sauce. Barbecue sauce. Teriyaki sauce. Soy sauce, including reduced-sodium. Steak sauce. Fish sauce. Oyster sauce. Cocktail sauce. Horseradish that you find on the shelf. Regular ketchup and mustard. Meat flavorings and tenderizers. Bouillon cubes. Hot sauce and Tabasco sauce. Premade or packaged marinades. Premade or packaged taco seasonings. Relishes. Regular salad dressings. Salsa. Potato and tortilla chips. Corn chips and puffs. Salted popcorn and pretzels. Canned or dried soups. Pizza. Frozen entrees and pot pies. Summary  Eating less sodium can help lower your blood pressure, reduce swelling, and protect your heart, liver, and kidneys.  Most people on this plan should limit their sodium intake to 1,500-2,000 mg (milligrams) of sodium each day.  Canned, boxed, and frozen foods are high in sodium. Restaurant foods, fast foods, and pizza are also very high in sodium. You also get sodium by adding salt to food.  Try to cook at home, eat more fresh fruits and vegetables, and eat less fast food, canned, processed, or prepared foods. This information is not intended to replace advice given to you by your health care provider. Make sure you discuss any questions you have with your health care provider. Document Revised: 05/18/2017 Document Reviewed: 05/29/2016 Elsevier Patient Education  2020 [employer].

## 2019-06-27 ENCOUNTER — Ambulatory Visit: Payer: Medicare Other | Admitting: Podiatry

## 2019-07-04 ENCOUNTER — Ambulatory Visit: Payer: Medicare Other

## 2019-07-09 ENCOUNTER — Ambulatory Visit: Payer: Medicare Other | Admitting: Cardiology

## 2019-07-11 ENCOUNTER — Other Ambulatory Visit: Payer: Self-pay

## 2019-07-11 ENCOUNTER — Ambulatory Visit: Payer: Medicare Other | Admitting: Podiatry

## 2019-07-11 ENCOUNTER — Ambulatory Visit: Payer: Medicare Other | Attending: Internal Medicine

## 2019-07-11 DIAGNOSIS — R2689 Other abnormalities of gait and mobility: Secondary | ICD-10-CM | POA: Insufficient documentation

## 2019-07-11 DIAGNOSIS — R293 Abnormal posture: Secondary | ICD-10-CM | POA: Diagnosis present

## 2019-07-11 DIAGNOSIS — M6281 Muscle weakness (generalized): Secondary | ICD-10-CM | POA: Diagnosis present

## 2019-07-11 NOTE — Therapy (Signed)
[facility] [address] [place], [place], [address] Phone: [phone]   Fax:  [phone]  Physical Therapy Treatment  Patient Details  Name: [name] MRN: [id number] Date of Birth: 02/22/1941 Referring Provider (PT): [name]   Encounter Date: 07/11/2019  PT End of Session - 07/11/19 1452    Visit Number  6    Number of Visits  13    Date for PT Re-Evaluation  08/15/19    Authorization Type  [employer] medicare 10th visit progress note    PT Start Time  1450    PT Stop Time  1530    PT Time Calculation (min)  40 min    Equipment Utilized During Treatment  Gait belt    Activity Tolerance  Patient tolerated treatment well    Behavior During Therapy  [facility] for tasks assessed/performed       Past Medical History:  Diagnosis Date  . Arthritis   . Barrett esophagus   . Bronchitis   . Carotid artery occlusion   . COPD (chronic obstructive pulmonary disease) ([place])   . Diabetes mellitus without complication ([place])   . GERD (gastroesophageal reflux disease)   . Headache    migraines  . History of thyroid cancer 2008  . Hypertension   . Restless leg   . Sleep apnea with use of continuous positive airway pressure (CPAP)    2011 [facility] , AHI  77cn central and obstrcutive. 16 cm water , 3 cm EPR.   . Thoracic ascending aortic aneurysm (HCC)    4.2 cm ascending TAA 08/18/17 CTA. Annual imaging recommended.  [name] Ulcer    Peptic ulcer disease    Past Surgical History:  Procedure Laterality Date  . ANTERIOR CERVICAL DECOMP/DISCECTOMY FUSION N/A 09/03/2017   Procedure: ACDF - C3-C4 - C4-C5 - C5-C6;  Surgeon: Kary Kos, MD;  Location: [facility];  Service: Neurosurgery;  Laterality: N/A;  . CAROTID ENDARTERECTOMY Left January 03, 2006   Dr. Amedeo Plenty  . COLONOSCOPY    . EYE SURGERY Bilateral    cataract surgery with lens implants  . game keepers thumb Right   . JOINT REPLACEMENT Left    knee X 2  . ROTATOR CUFF REPAIR  Right   . THYROIDECTOMY  2008    There were no vitals filed for this visit.  Subjective Assessment - 07/11/19 1453    Subjective  Pt missed last couple sessions due to running a fever a couple weeks ago and then caregiver had covid. He was tested and was negative.    Pertinent History  PMH:  79 y.o. male with past medical history significant for coronary artery disease, COPD not on home oxygen, diabetes, restless leg syndrome, OSA on CPAP, CHF. Anterior cervical decomp/disectomy 08/2017    Patient Stated Goals  Pt wants to walk.    Currently in Pain?  No/denies                       [facility] Adult PT Treatment/Exercise - 07/11/19 1455      Bed Mobility   Bed Mobility  Rolling Right;Rolling Left;Supine to Sit;Sit to Supine    Rolling Right  Supervision/verbal cueing   verbal cues to bend knees up and reach across with opp arm   Rolling Left  Supervision/Verbal cueing    Supine to Sit  Contact Guard/Touching assist   verbal cues to get elbow under as coming up from sidelying   Sit  to Supine  Independent      Transfers   Transfers  Sit to Stand;Stand to Sit;Lateral/Scoot Transfers    Sit to Stand  4: Min guard    Sit to Stand Details  Verbal cues for technique    Sit to Stand Details (indicate cue type and reason)  Pt was instructed to put 1 hand on walker and one on mat but patient pushed on cross bar of walker.    Stand to Sit  4: Min guard    Lateral/Scoot Transfers  5: Supervision    Lateral/Scoot Transfer Details (indicate cue type and reason)  transport chair to mat      Ambulation/Gait   Ambulation/Gait  Yes    Ambulation/Gait Assistance  4: Min guard    Ambulation/Gait Assistance Details  Verbal cues to stay up in walker for more erect posture and try to increase foot clearance.    Ambulation Distance (Feet)  16 Feet    Assistive device  Rolling walker   w/c follow   Gait Pattern  Step-to pattern;Decreased step length - left;Decreased step length - right;Poor  foot clearance - left;Poor foot clearance - right    Ambulation Surface  Level;Indoor      Neuro Re-ed    Neuro Re-ed Details   Standing at walker x 3 min, brief bout of no UE support, reaching for targets x 3 with each arm in varied directions, standing with alternating shoulder flexion x 5 each arm.      Exercises   Exercises  Other Exercises    Other Exercises   Supine quad sets x 10, bridges x 10, bridges with scooting to the side, sidelying clamshell x 10 each side, sidelying hip flexor stretch 30 sec x 2 each side. Pt has increased tone present when lays down on mat. Verbal cues for form with exercises.             PT Education - 07/11/19 2135    Education Details  Pt instructed to continue with current HEP    Person(s) Educated  Patient    Methods  Explanation    Comprehension  Verbalized understanding       PT Short Term Goals - 06/06/19 1859      PT SHORT TERM GOAL #1   Title  Pt will be instructed in HEP for strengthening and balance activities to progress functional mobility on own safely with caregiver assist.    Baseline  05/09/19: met with current program. Adding hamstring stretch and standing at sink.    Status  Achieved    Target Date  05/11/19      PT SHORT TERM GOAL #2   Title  Pt will be able to perform sit to stand transfers from varied surfaces supervision for improved mobility.    Baseline  CGA with sit to stand at // bars and walker.    Time  4    Period  Weeks    Status  On-going    Target Date  07/18/19   4 weeks from next visit due to delay in schedule     PT SHORT TERM GOAL #3   Title  Pt will ambulate 21' with FWW CGA for improved mobility in home.    Baseline  6' in // bars min assist    Time  4    Period  Weeks    Status  On-going    Target Date  07/18/19        PT Long Term  Goals - 06/06/19 1902      PT LONG TERM GOAL #1   Title  Pt will increase 30 sec sit to stand from 4 reps edge of mat to 6 or more for improved functional  strength and mobility.    Baseline  not reassessed    Time  8    Period  Weeks    Status  On-going    Target Date  08/15/19   dated extended due to delay in scheduling     PT LONG TERM GOAL #2   Title  Pt will be able to ambulate >100' with FWW supervision for improved household distances.    Time  8    Period  Weeks    Status  On-going    Target Date  08/15/19      PT LONG TERM GOAL #3   Title  Pt will be able to maintain standing x 5 min with minimal UE support for improved balance to assist with ADLs.    Time  8    Period  Weeks    Status  On-going    Target Date  08/15/19            Plan - 07/11/19 2135    Clinical Impression Statement  Pt demonstrated improved upright posture again today in standing. Able to progress gait with walker today with decreased assistance.    Personal Factors and Comorbidities  Comorbidity 3+;Age    Examination-Activity Limitations  Bathing;Locomotion Level;Transfers;Stand    Examination-Participation Restrictions  Community Activity    Stability/Clinical Decision Making  Evolving/Moderate complexity    Rehab Potential  Good    PT Frequency  1x / week    PT Duration  8 weeks    PT Treatment/Interventions  ADLs/Self Care Home Management;Cryotherapy;DME Instruction;Moist Heat;Gait training;Stair training;Functional mobility training;Therapeutic activities;Therapeutic exercise;Balance training;Patient/family education;Neuromuscular re-education;Manual techniques;Passive range of motion    PT Next Visit Plan  Check STGs. Continue with transfer training, strengthening/stretching to help with more upright posture, standing balance. Gait with RW and chair follow.    PT Home Exercise Plan  [email or web address]    Consulted and Agree with Plan of Care  Patient;Family member/caregiver    Family Member Consulted  wife       Patient will benefit from skilled therapeutic intervention in order to improve the following deficits and impairments:  Abnormal gait,  Cardiopulmonary status limiting activity, Decreased activity tolerance, Decreased balance, Difficulty walking, Increased edema, Impaired sensation, Impaired flexibility, Impaired UE functional use, Postural dysfunction, Pain  Visit Diagnosis: Muscle weakness (generalized)  Abnormal posture  Other abnormalities of gait and mobility     Problem List Patient Active Problem List   Diagnosis Date Noted  . PVD (peripheral vascular disease) ([place]) 02/14/2019  . Essential hypertension 02/13/2019  . Non-insulin treated type 2 diabetes mellitus ([place]) 02/13/2019  . Severe sepsis ([place]) 01/17/2019  . Community acquired pneumonia of right lower lobe of lung 01/17/2019  . Postural urinary incontinence 05/20/2018  . 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 Myoclonic jerking 05/20/2018  . 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 History of respiratory failure 08/18/2017  . Hypokalemia 08/18/2017  . Leukocytosis 08/18/2017  . Muscle atrophy of lower extremity 08/09/2017  . Bowel and bladder incontinence 08/09/2017  . Right-sided muscle weakness 08/09/2017  . Neuropathy 08/09/2017  . 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/2017  . Abnormality of gait 07/12/2017  . Myoclonic jerkings, massive  07/12/2017  . Acquired right foot drop 07/12/2017  . UTI (urinary tract infection) due to Enterococcus 07/12/2017  . Pressure injury of skin 06/21/2017  . Acute cystitis 06/20/2017  . Generalized weakness 06/20/2017  . Dehydration 06/20/2017  . Dyspnea 12/26/2016  . Chronic cough 12/26/2016  . Hypersomnia with sleep apnea 10/30/2016  . CSA (central sleep apnea) 10/30/2016  . Wheezing symptom 10/30/2016  . 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 RLS (restless legs syndrome) 08/25/2014  . Primary gout 08/25/2014  . OSA on CPAP 08/25/2014  . Severe obesity (BMI >= 40) ([place]) 08/25/2014  .  Obesity (BMI 30-39.9) 02/18/2013  . 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 S/P TKR (total knee replacement) 11/29/2012  . History of thyroid cancer     Electa Sniff, PT, DPT, NCS 07/11/2019, 9:37 PM  [facility] [address] [place], [place], [address] Phone: [phone]   Fax:  [phone]  Name: [name] MRN: [id number] Date of Birth: Mar 14, 1941

## 2019-07-15 ENCOUNTER — Ambulatory Visit: Payer: Medicare Other

## 2019-07-15 ENCOUNTER — Other Ambulatory Visit: Payer: Self-pay

## 2019-07-15 DIAGNOSIS — R293 Abnormal posture: Secondary | ICD-10-CM

## 2019-07-15 DIAGNOSIS — M6281 Muscle weakness (generalized): Secondary | ICD-10-CM | POA: Diagnosis not present

## 2019-07-15 DIAGNOSIS — R2689 Other abnormalities of gait and mobility: Secondary | ICD-10-CM

## 2019-07-15 NOTE — Therapy (Signed)
[facility] [address] [place], [place], [address] Phone: [phone]   Fax:  [phone]  Physical Therapy Treatment  Patient Details  Name: [name] MRN: [id number] Date of Birth: 1941/04/14 Referring Provider (PT): [name]   Encounter Date: 07/15/2019  PT End of Session - 07/15/19 1458    Visit Number  7    Number of Visits  13    Date for PT Re-Evaluation  08/15/19    Authorization Type  [employer] medicare 10th visit progress note    PT Start Time  1457   PT running behind   PT Stop Time  1528    PT Time Calculation (min)  31 min    Equipment Utilized During Treatment  Gait belt    Activity Tolerance  Patient tolerated treatment well    Behavior During Therapy  [facility] for tasks assessed/performed       Past Medical History:  Diagnosis Date  . Arthritis   . Barrett esophagus   . Bronchitis   . Carotid artery occlusion   . COPD (chronic obstructive pulmonary disease) ([place])   . Diabetes mellitus without complication ([place])   . GERD (gastroesophageal reflux disease)   . Headache    migraines  . History of thyroid cancer 2008  . Hypertension   . Restless leg   . Sleep apnea with use of continuous positive airway pressure (CPAP)    [facility] , AHI  77cn central and obstrcutive. 16 cm water , 3 cm EPR.   . Thoracic ascending aortic aneurysm (HCC)    4.2 cm ascending TAA 08/18/17 CTA. Annual imaging recommended.  [name] Ulcer    Peptic ulcer disease    Past Surgical History:  Procedure Laterality Date  . ANTERIOR CERVICAL DECOMP/DISCECTOMY FUSION N/A 09/03/2017   Procedure: ACDF - C3-C4 - C4-C5 - C5-C6;  Surgeon: Kary Kos, MD;  Location: [facility];  Service: Neurosurgery;  Laterality: N/A;  . CAROTID ENDARTERECTOMY Left January 03, 2006   Dr. Amedeo Plenty  . COLONOSCOPY    . EYE SURGERY Bilateral    cataract surgery with lens implants  . game keepers thumb Right   . JOINT REPLACEMENT Left    knee X 2  .  ROTATOR CUFF REPAIR Right   . THYROIDECTOMY  2008    There were no vitals filed for this visit.  Subjective Assessment - 07/15/19 1458    Subjective  Pt reports he is doing okay. Was a little sore after last session.    Pertinent History  PMH:  79 y.o. male with past medical history significant for coronary artery disease, COPD not on home oxygen, diabetes, restless leg syndrome, OSA on CPAP, CHF. Anterior cervical decomp/disectomy 08/2017    Patient Stated Goals  Pt wants to walk.    Currently in Pain?  No/denies                       [facility] Adult PT Treatment/Exercise - 07/15/19 1503      Transfers   Transfers  Sit to Stand;Stand to [employer] Transfers    Sit to Stand  4: Min guard    Sit to Stand Details  Verbal cues for technique    Sit to Stand Details (indicate cue type and reason)  Pt performed x 2 from wheelchair with 1 hand on cross bar    Stand to Sit  4: Min guard    Stand to Sit Details (indicate  cue type and reason)  Verbal cues for technique    Stand to Sit Details  Verbal cues to reach back    Stand Pivot Transfers  4: Min guard   with walker w/c to mat   Comments  Pt performed sit to stand x 5 from edge of mat. 1 hand on cross bar of walker and one on handle but pushing straight down and would not try to push from mat.       Ambulation/Gait   Ambulation/Gait  Yes    Ambulation/Gait Assistance  4: Min guard    Ambulation/Gait Assistance Details  Verbal cues to try to increase foot clearance and hold head up for erect posture.    Ambulation Distance (Feet)  27 Feet   25x1, 20' x 1   Assistive device  Rolling walker   w/c follow   Gait Pattern  Step-through pattern;Decreased step length - right;Decreased step length - left;Poor foot clearance - left;Poor foot clearance - right    Ambulation Surface  Level;Indoor      Neuro Re-ed    Neuro Re-ed Details   Standing at walker: 3 bouts of standing- 1st bouts about 10 sec without UE support,2nd bout  15 sec then right leg drew up and had to sit quickly, 3rd bouts performed reaching with 1 UE support x 8 each side. Pt was cued to stand tall throughout. Close SBA/CGA for safety             PT Education - 07/15/19 1845    Education Details  PT instructed on trying to start walking with aide and wife at home with w/c follow. Advised to walk 15-20' with RW    Person(s) Educated  Patient;Spouse    Methods  Explanation    Comprehension  Verbalized understanding       PT Short Term Goals - 07/15/19 1846      PT SHORT TERM GOAL #1   Title  Pt will be instructed in HEP for strengthening and balance activities to progress functional mobility on own safely with caregiver assist.    Baseline  05/09/19: met with current program. Adding hamstring stretch and standing at sink.    Status  Achieved    Target Date  05/11/19      PT SHORT TERM GOAL #2   Title  Pt will be able to perform sit to stand transfers from varied surfaces supervision for improved mobility.    Baseline  CGA with sit to stand transfers    Time  4    Period  Weeks    Status  On-going    Target Date  07/18/19   4 weeks from next visit due to delay in schedule     PT SHORT TERM GOAL #3   Title  Pt will ambulate 38' with FWW CGA for improved mobility in home.    Baseline  Gait up to 32' with RW CGA    Time  4    Period  Weeks    Status  On-going    Target Date  07/18/19        PT Long Term Goals - 06/06/19 1902      PT LONG TERM GOAL #1   Title  Pt will increase 30 sec sit to stand from 4 reps edge of mat to 6 or more for improved functional strength and mobility.    Baseline  not reassessed    Time  8    Period  Weeks  Status  On-going    Target Date  08/15/19   dated extended due to delay in scheduling     PT LONG TERM GOAL #2   Title  Pt will be able to ambulate >100' with FWW supervision for improved household distances.    Time  8    Period  Weeks    Status  On-going    Target Date  08/15/19       PT LONG TERM GOAL #3   Title  Pt will be able to maintain standing x 5 min with minimal UE support for improved balance to assist with ADLs.    Time  8    Period  Weeks    Status  On-going    Target Date  08/15/19            Plan - 07/15/19 1847    Clinical Impression Statement  PT assessed STGs today. Pt showing improved transfer ability with CGA for safety. Pt has been performing HEP as instructed with family assist. Pt was able to increase gait distance again today. Pt continues to benefit from skilled PT to address strength, gait and other functional mobility deficits.    Personal Factors and Comorbidities  Comorbidity 3+;Age    Examination-Activity Limitations  Bathing;Locomotion Level;Transfers;Stand    Examination-Participation Restrictions  Community Activity    Stability/Clinical Decision Making  Evolving/Moderate complexity    Rehab Potential  Good    PT Frequency  1x / week    PT Duration  8 weeks    PT Treatment/Interventions  ADLs/Self Care Home Management;Cryotherapy;DME Instruction;Moist Heat;Gait training;Stair training;Functional mobility training;Therapeutic activities;Therapeutic exercise;Balance training;Patient/family education;Neuromuscular re-education;Manual techniques;Passive range of motion    PT Next Visit Plan  Continue with transfer training, strengthening/stretching to help with more upright posture, standing balance. Gait with RW and chair follow.    PT Home Exercise Plan  [id number]    Consulted and Agree with Plan of Care  Patient;Family member/caregiver    Family Member Consulted  wife       Patient will benefit from skilled therapeutic intervention in order to improve the following deficits and impairments:  Abnormal gait, Cardiopulmonary status limiting activity, Decreased activity tolerance, Decreased balance, Difficulty walking, Increased edema, Impaired sensation, Impaired flexibility, Impaired UE functional use, Postural dysfunction,  Pain  Visit Diagnosis: Muscle weakness (generalized)  Abnormal posture  Other abnormalities of gait and mobility     Problem List Patient Active Problem List   Diagnosis Date Noted  . PVD (peripheral vascular disease) ([place]) 02/14/2019  . Essential hypertension 02/13/2019  . Non-insulin treated type 2 diabetes mellitus ([place]) 02/13/2019  . Severe sepsis ([place]) 01/17/2019  . Community acquired pneumonia of right lower lobe of lung 01/17/2019  . Postural urinary incontinence 05/20/2018  . 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 Myoclonic jerking 05/20/2018  . 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 History of respiratory failure 08/18/2017  . Hypokalemia 08/18/2017  . Leukocytosis 08/18/2017  . Muscle atrophy of lower extremity 08/09/2017  . Bowel and bladder incontinence 08/09/2017  . Right-sided muscle weakness 08/09/2017  . Neuropathy 08/09/2017  . 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/2017  . Abnormality of gait 07/12/2017  . Myoclonic jerkings, 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/2017  . Acquired right foot drop 07/12/2017  . UTI (urinary tract infection) due to Enterococcus 07/12/2017  . Pressure injury of skin 06/21/2017  . Acute cystitis 06/20/2017  . Generalized weakness 06/20/2017  .  Dehydration 06/20/2017  . Dyspnea 12/26/2016  . Chronic cough 12/26/2016  . Hypersomnia with sleep apnea 10/30/2016  . CSA (central sleep apnea) 10/30/2016  . Wheezing symptom 10/30/2016  . 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 RLS (restless legs syndrome) 08/25/2014  . Primary gout 08/25/2014  . OSA on CPAP 08/25/2014  . Severe obesity (BMI >= 40) ([place]) 08/25/2014  . Obesity (BMI 30-39.9) 02/18/2013  . 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 S/P TKR (total knee replacement) 11/29/2012  . History of thyroid cancer     Electa Sniff, PT, DPT, NCS 07/15/2019, 7:33 PM  [facility] [address] [place], [place], [address] Phone: [phone]   Fax:  [phone]  Name: [name] MRN: [id number] Date of Birth: 1941/01/25

## 2019-07-16 ENCOUNTER — Ambulatory Visit: Payer: Medicare Other

## 2019-07-18 ENCOUNTER — Ambulatory Visit: Payer: Medicare Other | Admitting: Podiatry

## 2019-07-18 ENCOUNTER — Other Ambulatory Visit: Payer: Self-pay

## 2019-07-18 DIAGNOSIS — E1151 Type 2 diabetes mellitus with diabetic peripheral angiopathy without gangrene: Secondary | ICD-10-CM | POA: Diagnosis not present

## 2019-07-18 DIAGNOSIS — M79674 Pain in right toe(s): Secondary | ICD-10-CM | POA: Diagnosis not present

## 2019-07-18 DIAGNOSIS — M79675 Pain in left toe(s): Secondary | ICD-10-CM | POA: Diagnosis not present

## 2019-07-18 DIAGNOSIS — B351 Tinea unguium: Secondary | ICD-10-CM | POA: Diagnosis not present

## 2019-07-21 ENCOUNTER — Encounter: Payer: Self-pay | Admitting: Podiatry

## 2019-07-21 NOTE — Progress Notes (Signed)
  Subjective:  Patient ID: [name], male    DOB: 1940-07-09,  MRN: [id number]  Chief Complaint  Patient presents with  . Nail Problem    pt is here for a nail trim   79 y.o. male returns for the above complaint.  His last A1c was 5.6.  Today he is here for debridement of the bilateral mycotic toenails that are painful in nature.  No other acute complaints.  He states it has been hurting him for a while he has difficulty ambulating given that they are thick elongated.  He denies any other acute complaints.  Objective:  There were no vitals filed for this visit. Podiatric Exam: Vascular: dorsalis pedis and posterior tibial pulses are palpable bilateral. Capillary return is immediate. Temperature gradient is WNL. Skin turgor WNL  Sensorium: Normal Semmes Weinstein monofilament test. Normal tactile sensation bilaterally. Nail Exam: Pt has thick disfigured discolored nails with subungual debris noted bilateral entire nail hallux through fifth toenails Ulcer Exam: There is no evidence of ulcer or pre-ulcerative changes or infection. Orthopedic Exam: Muscle tone and strength are WNL. No limitations in general ROM. No crepitus or effusions noted. HAV  B/L.  Hammer toes 2-5  B/L. Skin: No Porokeratosis. No infection or ulcers  Assessment & Plan:  Patient was evaluated and treated and all questions answered.  Onychomycosis with pain  -Nails palliatively debrided as below. -Educated on self-care  Procedure: Nail Debridement Rationale: pain  Type of Debridement: manual, sharp debridement. Instrumentation: Nail nipper, rotary burr. Number of Nails: 10  Procedures and Treatment: Consent by patient was obtained for treatment procedures. The patient understood the discussion of treatment and procedures well. All questions were answered thoroughly reviewed. Debridement of mycotic and hypertrophic toenails, 1 through 5 bilateral and clearing of subungual debris. No ulceration, no infection noted.   Return Visit-Office Procedure: Patient instructed to return to the office for a follow up visit 3 months for continued evaluation and treatment.  Boneta Lucks, DPM    No follow-ups on file.

## 2019-07-22 ENCOUNTER — Other Ambulatory Visit: Payer: Self-pay

## 2019-07-22 ENCOUNTER — Ambulatory Visit: Payer: Medicare Other | Attending: Internal Medicine

## 2019-07-22 DIAGNOSIS — M6281 Muscle weakness (generalized): Secondary | ICD-10-CM | POA: Insufficient documentation

## 2019-07-22 DIAGNOSIS — R293 Abnormal posture: Secondary | ICD-10-CM | POA: Diagnosis present

## 2019-07-22 DIAGNOSIS — R2689 Other abnormalities of gait and mobility: Secondary | ICD-10-CM | POA: Diagnosis present

## 2019-07-22 NOTE — Therapy (Signed)
[facility] [address] [place], [place], [address] Phone: [phone]   Fax:  [phone]  Physical Therapy Treatment  Patient Details  Name: [name] MRN: [id number] Date of Birth: November 26, 1940 Referring Provider (PT): [name]   Encounter Date: 07/22/2019  PT End of Session - 07/22/19 1537    Visit Number  8    Number of Visits  13    Date for PT Re-Evaluation  08/15/19    Authorization Type  [employer] medicare 10th visit progress note    PT Start Time  1530    PT Stop Time  1611    PT Time Calculation (min)  41 min    Equipment Utilized During Treatment  Gait belt    Activity Tolerance  Patient tolerated treatment well    Behavior During Therapy  [facility] for tasks assessed/performed       Past Medical History:  Diagnosis Date  . Arthritis   . Barrett esophagus   . Bronchitis   . Carotid artery occlusion   . COPD (chronic obstructive pulmonary disease) ([place])   . Diabetes mellitus without complication ([place])   . GERD (gastroesophageal reflux disease)   . Headache    migraines  . History of thyroid cancer 2008  . Hypertension   . Restless leg   . Sleep apnea with use of continuous positive airway pressure (CPAP)    [facility] , AHI  77cn central and obstrcutive. 16 cm water , 3 cm EPR.   . Thoracic ascending aortic aneurysm (HCC)    4.2 cm ascending TAA 08/18/17 CTA. Annual imaging recommended.  [name] Ulcer    Peptic ulcer disease    Past Surgical History:  Procedure Laterality Date  . ANTERIOR CERVICAL DECOMP/DISCECTOMY FUSION N/A 09/03/2017   Procedure: ACDF - C3-C4 - C4-C5 - C5-C6;  Surgeon: Kary Kos, MD;  Location: [facility];  Service: Neurosurgery;  Laterality: N/A;  . CAROTID ENDARTERECTOMY Left January 03, 2006   Dr. Amedeo Plenty  . COLONOSCOPY    . EYE SURGERY Bilateral    cataract surgery with lens implants  . game keepers thumb Right   . JOINT REPLACEMENT Left    knee X 2  . ROTATOR CUFF REPAIR  Right   . THYROIDECTOMY  2008    There were no vitals filed for this visit.  Subjective Assessment - 07/22/19 1535    Subjective  Pt reports he is doing okay. Did a little bit of walking every day except for Sunday. Wife states was about 25' each time.    Pertinent History  PMH:  79 y.o. male with past medical history significant for coronary artery disease, COPD not on home oxygen, diabetes, restless leg syndrome, OSA on CPAP, CHF. Anterior cervical decomp/disectomy 08/2017    Patient Stated Goals  Pt wants to walk.    Currently in Pain?  No/denies                       [facility] Adult PT Treatment/Exercise - 07/22/19 1537      Transfers   Transfers  Sit to Stand;Stand to [employer] Transfers    Sit to Stand  4: Min guard    Sit to Stand Details  Verbal cues for technique    Sit to Stand Details (indicate cue type and reason)  Pt pushes with 1 hand on cross bar of walker and 2nd on handle despite cues to push from chair.  Stand to Sit  5: Supervision    Stand Pivot Transfers  4: Min guard    Squat Pivot Transfer Details (indicate cue type and reason)  w/c to Sci-Fit with walker      Ambulation/Gait   Ambulation/Gait  Yes    Ambulation/Gait Assistance  4: Min guard    Ambulation/Gait Assistance Details  Pt was cued to increase right foot clearance, stay up in walker and keep head up.    Ambulation Distance (Feet)  12 Feet   60' x 1, 55 x 1   Assistive device  Rolling walker    Gait Pattern  Step-through pattern;Decreased step length - right;Decreased step length - left;Decreased hip/knee flexion - right;Decreased hip/knee flexion - left;Poor foot clearance - right    Ambulation Surface  Level;Indoor      Neuro Re-ed    Neuro Re-ed Details   Standing at walker picking up bean bag off platform on left and handing to right hand and then tossing fowards in to basket x 15. CGA throughout. Pt was cued to maintain erect posture. 1 episode of right leg drawing up but able  to correct. Seated break then performed standing with 1 UE support on walking with trunk rotation  picking up bean bag across body and handing back to therapist x 6 each side CGA.      Knee/Hip Exercises: Aerobic   Other Aerobic  Sci-Fit x 6 min level 2 to warm up.              PT Education - 07/22/19 1618    Education Details  PT encouraged patient and wife to continue to work on walking at home with aide for w/c follow.    Person(s) Educated  Patient;Spouse    Methods  Explanation    Comprehension  Verbalized understanding       PT Short Term Goals - 07/15/19 1846      PT SHORT TERM GOAL #1   Title  Pt will be instructed in HEP for strengthening and balance activities to progress functional mobility on own safely with caregiver assist.    Baseline  05/09/19: met with current program. Adding hamstring stretch and standing at sink.    Status  Achieved    Target Date  05/11/19      PT SHORT TERM GOAL #2   Title  Pt will be able to perform sit to stand transfers from varied surfaces supervision for improved mobility.    Baseline  CGA with sit to stand transfers    Time  4    Period  Weeks    Status  On-going    Target Date  07/18/19   4 weeks from next visit due to delay in schedule     PT SHORT TERM GOAL #3   Title  Pt will ambulate 55' with FWW CGA for improved mobility in home.    Baseline  Gait up to 31' with RW CGA    Time  4    Period  Weeks    Status  On-going    Target Date  07/18/19        PT Long Term Goals - 06/06/19 1902      PT LONG TERM GOAL #1   Title  Pt will increase 30 sec sit to stand from 4 reps edge of mat to 6 or more for improved functional strength and mobility.    Baseline  not reassessed    Time  8    Period  Weeks    Status  On-going    Target Date  08/15/19   dated extended due to delay in scheduling     PT LONG TERM GOAL #2   Title  Pt will be able to ambulate >100' with FWW supervision for improved household distances.    Time   8    Period  Weeks    Status  On-going    Target Date  08/15/19      PT LONG TERM GOAL #3   Title  Pt will be able to maintain standing x 5 min with minimal UE support for improved balance to assist with ADLs.    Time  8    Period  Weeks    Status  On-going    Target Date  08/15/19            Plan - 07/22/19 1620    Clinical Impression Statement  Pt was able to increase gait distance significantly today with more upright posture. PT worked on standing balance activities while focusing on upright posture as well with longer bouts in standing with activities.    Personal Factors and Comorbidities  Comorbidity 3+;Age    Examination-Activity Limitations  Bathing;Locomotion Level;Transfers;Stand    Examination-Participation Restrictions  Community Activity    Stability/Clinical Decision Making  Evolving/Moderate complexity    Rehab Potential  Good    PT Frequency  1x / week    PT Duration  8 weeks    PT Treatment/Interventions  ADLs/Self Care Home Management;Cryotherapy;DME Instruction;Moist Heat;Gait training;Stair training;Functional mobility training;Therapeutic activities;Therapeutic exercise;Balance training;Patient/family education;Neuromuscular re-education;Manual techniques;Passive range of motion    PT Next Visit Plan  Continue with transfer training, strengthening/stretching to help with more upright posture, standing balance. Gait with RW and chair follow.    PT Home Exercise Plan  [email or web address]    Consulted and Agree with Plan of Care  Patient;Family member/caregiver    Family Member Consulted  wife       Patient will benefit from skilled therapeutic intervention in order to improve the following deficits and impairments:  Abnormal gait, Cardiopulmonary status limiting activity, Decreased activity tolerance, Decreased balance, Difficulty walking, Increased edema, Impaired sensation, Impaired flexibility, Impaired UE functional use, Postural dysfunction, Pain  Visit  Diagnosis: Muscle weakness (generalized)  Abnormal posture  Other abnormalities of gait and mobility     Problem List Patient Active Problem List   Diagnosis Date Noted  . PVD (peripheral vascular disease) ([place]) 02/14/2019  . Essential hypertension 02/13/2019  . Non-insulin treated type 2 diabetes mellitus ([place]) 02/13/2019  . Severe sepsis ([place]) 01/17/2019  . Community acquired pneumonia of right lower lobe of lung 01/17/2019  . Postural urinary incontinence 05/20/2018  . 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 Myoclonic jerking 05/20/2018  . 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 History of respiratory failure 08/18/2017  . Hypokalemia 08/18/2017  . Leukocytosis 08/18/2017  . Muscle atrophy of lower extremity 08/09/2017  . Bowel and bladder incontinence 08/09/2017  . Right-sided muscle weakness 08/09/2017  . Neuropathy 08/09/2017  . 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/2017  . Abnormality of gait 07/12/2017  . Myoclonic jerkings, 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/2017  . Acquired right foot drop 07/12/2017  . UTI (urinary tract infection) due to Enterococcus 07/12/2017  . Pressure injury of skin 06/21/2017  . Acute cystitis 06/20/2017  . Generalized weakness 06/20/2017  . Dehydration 06/20/2017  . Dyspnea 12/26/2016  . Chronic cough 12/26/2016  .  Hypersomnia with sleep apnea 10/30/2016  . CSA (central sleep apnea) 10/30/2016  . Wheezing symptom 10/30/2016  . 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 RLS (restless legs syndrome) 08/25/2014  . Primary gout 08/25/2014  . OSA on CPAP 08/25/2014  . Severe obesity (BMI >= 40) ([facility]) 08/25/2014  . Obesity (BMI 30-39.9) 02/18/2013  . 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 S/P TKR (total knee replacement) 11/29/2012  . History of thyroid cancer     Electa Sniff, PT, DPT,  NCS 07/22/2019, 4:21 PM  [facility] [address] [place], [place], [address] Phone: [phone]   Fax:  [phone]  Name: [name] MRN: [id number] Date of Birth: [birth year]

## 2019-07-25 ENCOUNTER — Ambulatory Visit: Payer: Medicare Other | Attending: Internal Medicine

## 2019-07-25 DIAGNOSIS — Z23 Encounter for immunization: Secondary | ICD-10-CM | POA: Insufficient documentation

## 2019-07-25 NOTE — Progress Notes (Signed)
   Covid-19 Vaccination Clinic  Name:  BARTLEY AASE    MRN: [id number] DOB: Feb 08, 1941  07/25/2019  Mr. Balbin was observed post Covid-19 immunization for 15 minutes without incidence. He was provided with Vaccine Information Sheet and instruction to access the V-Safe system.   Mr. Haddow was instructed to call 911 with any severe reactions post vaccine: [name] Difficulty breathing  . Swelling of your face and throat  . A fast heartbeat  . A bad rash all over your body  . Dizziness and weakness    Immunizations Administered    Name Date Dose VIS Date Route   Pfizer COVID-19 Vaccine 07/25/2019 11:20 AM 0.3 mL 05/30/2019 Intramuscular   Manufacturer: [place]   Lot: [id number]   [place]: [id number]

## 2019-07-28 ENCOUNTER — Other Ambulatory Visit: Payer: Self-pay

## 2019-07-28 ENCOUNTER — Ambulatory Visit: Payer: Medicare Other

## 2019-07-28 DIAGNOSIS — M6281 Muscle weakness (generalized): Secondary | ICD-10-CM

## 2019-07-28 DIAGNOSIS — R293 Abnormal posture: Secondary | ICD-10-CM

## 2019-07-28 DIAGNOSIS — R2689 Other abnormalities of gait and mobility: Secondary | ICD-10-CM

## 2019-07-28 NOTE — Therapy (Signed)
[facility] [address] [place], [place], [address] Phone: [phone]   Fax:  [phone]  Physical Therapy Treatment  Patient Details  Name: [name] MRN: [id number] Date of Birth: 20-Aug-1940 Referring Provider (PT): [name]   Encounter Date: 07/28/2019  PT End of Session - 07/28/19 1400    Visit Number  9    Number of Visits  13    Date for PT Re-Evaluation  08/15/19    Authorization Type  [employer] medicare 10th visit progress note    PT Start Time  1400    PT Stop Time  1442    PT Time Calculation (min)  42 min    Equipment Utilized During Treatment  Gait belt    Activity Tolerance  Patient tolerated treatment well    Behavior During Therapy  [facility] for tasks assessed/performed       Past Medical History:  Diagnosis Date  . Arthritis   . Barrett esophagus   . Bronchitis   . Carotid artery occlusion   . COPD (chronic obstructive pulmonary disease) ([place])   . Diabetes mellitus without complication ([place])   . GERD (gastroesophageal reflux disease)   . Headache    migraines  . History of thyroid cancer 2008  . Hypertension   . Restless leg   . Sleep apnea with use of continuous positive airway pressure (CPAP)    2011 [facility] , AHI  77cn central and obstrcutive. 16 cm water , 3 cm EPR.   . Thoracic ascending aortic aneurysm (HCC)    4.2 cm ascending TAA 08/18/17 CTA. Annual imaging recommended.  [name] Ulcer    Peptic ulcer disease    Past Surgical History:  Procedure Laterality Date  . ANTERIOR CERVICAL DECOMP/DISCECTOMY FUSION N/A 09/03/2017   Procedure: ACDF - C3-C4 - C4-C5 - C5-C6;  Surgeon: Kary Kos, MD;  Location: [facility];  Service: Neurosurgery;  Laterality: N/A;  . CAROTID ENDARTERECTOMY Left January 03, 2006   Dr. Amedeo Plenty  . COLONOSCOPY    . EYE SURGERY Bilateral    cataract surgery with lens implants  . game keepers thumb Right   . JOINT REPLACEMENT Left    knee X 2  . ROTATOR CUFF REPAIR  Right   . THYROIDECTOMY  2008    There were no vitals filed for this visit.  Subjective Assessment - 07/28/19 1400    Subjective  Pt denies any new issues. Pt reports that this morning back was bothering him some and right leg was drawing up more.    Pertinent History  PMH:  79 y.o. male with past medical history significant for coronary artery disease, COPD not on home oxygen, diabetes, restless leg syndrome, OSA on CPAP, CHF. Anterior cervical decomp/disectomy 08/2017    Patient Stated Goals  Pt wants to walk.    Currently in Pain?  No/denies                       [facility] Adult PT Treatment/Exercise - 07/28/19 1400      Transfers   Transfers  Sit to Stand;Stand to [employer] Transfers    Sit to Stand  5: Supervision;4: Min guard    Sit to Stand Details  Verbal cues for technique    Sit to Stand Details (indicate cue type and reason)  Pt pushes off with 1 hand on walker  and other hand on crossbar of walker when rising from wheelchair despite cuing. Did have  patient perform sit to stand from mat x 3 with pushing with one hand on mat and one on walker with verbal cues for more anterior lean and to use momentum to rise.    Stand to Sit  5: Supervision    Stand Pivot Transfers  4: Min guard    Squat Pivot Transfer Details (indicate cue type and reason)  w/c to/from mat with FWW      Ambulation/Gait   Ambulation/Gait  Yes    Ambulation Distance (Feet)  26 Feet   60' x 1, 25' x 1, 45'x 1     Neuro Re-ed    Neuro Re-ed Details   Standing at walker with1 UE support reaching across body for 6 cones each side CGA.      Exercises   Exercises  Other Exercises    Other Exercises   Sit to stand with pushing from mat x 3 with 1 hand on walker CGA. Seated LAQ x 10 and seated hip flexion x 10.             PT Education - 07/28/19 1647    Education Details  Pt to continue with current HEP    Person(s) Educated  Patient    Methods  Explanation    Comprehension   Verbalized understanding       PT Short Term Goals - 07/15/19 1846      PT SHORT TERM GOAL #1   Title  Pt will be instructed in HEP for strengthening and balance activities to progress functional mobility on own safely with caregiver assist.    Baseline  05/09/19: met with current program. Adding hamstring stretch and standing at sink.    Status  Achieved    Target Date  05/11/19      PT SHORT TERM GOAL #2   Title  Pt will be able to perform sit to stand transfers from varied surfaces supervision for improved mobility.    Baseline  CGA with sit to stand transfers    Time  4    Period  Weeks    Status  On-going    Target Date  07/18/19   4 weeks from next visit due to delay in schedule     PT SHORT TERM GOAL #3   Title  Pt will ambulate 62' with FWW CGA for improved mobility in home.    Baseline  Gait up to 58' with RW CGA    Time  4    Period  Weeks    Status  On-going    Target Date  07/18/19        PT Long Term Goals - 06/06/19 1902      PT LONG TERM GOAL #1   Title  Pt will increase 30 sec sit to stand from 4 reps edge of mat to 6 or more for improved functional strength and mobility.    Baseline  not reassessed    Time  8    Period  Weeks    Status  On-going    Target Date  08/15/19   dated extended due to delay in scheduling     PT LONG TERM GOAL #2   Title  Pt will be able to ambulate >100' with FWW supervision for improved household distances.    Time  8    Period  Weeks    Status  On-going    Target Date  08/15/19      PT LONG TERM GOAL #3   Title  Pt will be able to maintain standing x 5 min with minimal UE support for improved balance to assist with ADLs.    Time  8    Period  Weeks    Status  On-going    Target Date  08/15/19            Plan - 07/28/19 1647    Clinical Impression Statement  Pt continues to make improvements in gait distance with more upright posture.    Personal Factors and Comorbidities  Comorbidity 3+;Age     Examination-Activity Limitations  Bathing;Locomotion Level;Transfers;Stand    Examination-Participation Restrictions  Community Activity    Stability/Clinical Decision Making  Evolving/Moderate complexity    Rehab Potential  Good    PT Frequency  1x / week    PT Duration  8 weeks    PT Treatment/Interventions  ADLs/Self Care Home Management;Cryotherapy;DME Instruction;Moist Heat;Gait training;Stair training;Functional mobility training;Therapeutic activities;Therapeutic exercise;Balance training;Patient/family education;Neuromuscular re-education;Manual techniques;Passive range of motion    PT Next Visit Plan  10th visit progress note next visit. Continue with transfer training, strengthening/stretching to help with more upright posture, standing balance. Gait with RW and chair follow.    PT Home Exercise Plan  [email or web address]    Consulted and Agree with Plan of Care  Patient;Family member/caregiver    Family Member Consulted  wife       Patient will benefit from skilled therapeutic intervention in order to improve the following deficits and impairments:  Abnormal gait, Cardiopulmonary status limiting activity, Decreased activity tolerance, Decreased balance, Difficulty walking, Increased edema, Impaired sensation, Impaired flexibility, Impaired UE functional use, Postural dysfunction, Pain  Visit Diagnosis: Muscle weakness (generalized)  Abnormal posture  Other abnormalities of gait and mobility     Problem List Patient Active Problem List   Diagnosis Date Noted  . PVD (peripheral vascular disease) ([place]) 02/14/2019  . Essential hypertension 02/13/2019  . Non-insulin treated type 2 diabetes mellitus ([place]) 02/13/2019  . Severe sepsis ([place]) 01/17/2019  . Community acquired pneumonia of right lower lobe of lung 01/17/2019  . Postural urinary incontinence 05/20/2018  . 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 Myoclonic jerking 05/20/2018   . 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 History of respiratory failure 08/18/2017  . Hypokalemia 08/18/2017  . Leukocytosis 08/18/2017  . Muscle atrophy of lower extremity 08/09/2017  . Bowel and bladder incontinence 08/09/2017  . Right-sided muscle weakness 08/09/2017  . Neuropathy 08/09/2017  . 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/2017  . Abnormality of gait 07/12/2017  . Myoclonic jerkings, 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/2017  . Acquired right foot drop 07/12/2017  . UTI (urinary tract infection) due to Enterococcus 07/12/2017  . Pressure injury of skin 06/21/2017  . Acute cystitis 06/20/2017  . Generalized weakness 06/20/2017  . Dehydration 06/20/2017  . Dyspnea 12/26/2016  . Chronic cough 12/26/2016  . Hypersomnia with sleep apnea 10/30/2016  . CSA (central sleep apnea) 10/30/2016  . Wheezing symptom 10/30/2016  . 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 RLS (restless legs syndrome) 08/25/2014  . Primary gout 08/25/2014  . OSA on CPAP 08/25/2014  . Severe obesity (BMI >= 40) ([place]) 08/25/2014  . Obesity (BMI 30-39.9) 02/18/2013  . 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 S/P TKR (total knee replacement) 11/29/2012  . History of thyroid cancer     Electa Sniff, PT, DPT, NCS 07/28/2019, 4:49 PM  [facility] [address] [place], [place], [address] Phone: [phone]   Fax:  [phone]  Name: [name] MRN: [id number] Date of Birth: 02-11-1941

## 2019-07-29 ENCOUNTER — Ambulatory Visit: Payer: Medicare Other

## 2019-08-04 ENCOUNTER — Other Ambulatory Visit: Payer: Self-pay

## 2019-08-04 ENCOUNTER — Ambulatory Visit: Payer: Medicare Other

## 2019-08-04 DIAGNOSIS — R2689 Other abnormalities of gait and mobility: Secondary | ICD-10-CM

## 2019-08-04 DIAGNOSIS — M6281 Muscle weakness (generalized): Secondary | ICD-10-CM | POA: Diagnosis not present

## 2019-08-04 NOTE — Therapy (Signed)
[facility] [address] [place], [place], [address] Phone: [phone]   Fax:  [phone]  Physical Therapy Treatment/10th visit progress note  Patient Details  Name: [name] MRN: [id number] Date of Birth: Nov 04, 1940 Referring Provider (PT): [name]    Progress Note  Reporting period 04/11/19 to 08/04/19  See Note below for Objective Data and Assessment of Progress/Goals   Encounter Date: 08/04/2019  PT End of Session - 08/04/19 1401    Visit Number  10    Number of Visits  13    Date for PT Re-Evaluation  08/15/19    Authorization Type  [employer] medicare 10th visit progress note    PT Start Time  1400    PT Stop Time  1445    PT Time Calculation (min)  45 min    Equipment Utilized During Treatment  Gait belt    Activity Tolerance  Patient tolerated treatment well    Behavior During Therapy  WFL for tasks assessed/performed       Past Medical History:  Diagnosis Date  . Arthritis   . Barrett esophagus   . Bronchitis   . Carotid artery occlusion   . COPD (chronic obstructive pulmonary disease) ([place])   . Diabetes mellitus without complication ([place])   . GERD (gastroesophageal reflux disease)   . Headache    migraines  . History of thyroid cancer 2008  . Hypertension   . Restless leg   . Sleep apnea with use of continuous positive airway pressure (CPAP)    [facility] , AHI  77cn central and obstrcutive. 16 cm water , 3 cm EPR.   . Thoracic ascending aortic aneurysm (HCC)    4.2 cm ascending TAA 08/18/17 CTA. Annual imaging recommended.  [name] Ulcer    Peptic ulcer disease    Past Surgical History:  Procedure Laterality Date  . ANTERIOR CERVICAL DECOMP/DISCECTOMY FUSION N/A 09/03/2017   Procedure: ACDF - C3-C4 - C4-C5 - C5-C6;  Surgeon: Kary Kos, MD;  Location: [facility];  Service: Neurosurgery;  Laterality: N/A;  . CAROTID ENDARTERECTOMY Left January 03, 2006   Dr. Amedeo Plenty  . COLONOSCOPY    .  EYE SURGERY Bilateral    cataract surgery with lens implants  . game keepers thumb Right   . JOINT REPLACEMENT Left    knee X 2  . ROTATOR CUFF REPAIR Right   . THYROIDECTOMY  2008    There were no vitals filed for this visit.  Subjective Assessment - 08/04/19 1400    Subjective  Pt reports he is doing ok. Has only been walking a little at home but is standing every day. States BP was low this morning and was feeling weak when tried to get up to weigh.    Pertinent History  PMH:  79 y.o. male with past medical history significant for coronary artery disease, COPD not on home oxygen, diabetes, restless leg syndrome, OSA on CPAP, CHF. Anterior cervical decomp/disectomy 08/2017    Patient Stated Goals  Pt wants to walk.    Currently in Pain?  No/denies                       [facility] Adult PT Treatment/Exercise - 08/04/19 1408      Transfers   Transfers  Sit to Stand;Stand to [employer] Transfers    Sit to Stand  5: Supervision;4: Min guard    Sit to Stand Details  Verbal cues  for technique    Sit to Stand Details (indicate cue type and reason)  From transport chair and x 3 from toilet holding to grab bar and pushing on arm of transport chair CGA with increased time to rise    Stand to Sit  5: Supervision    Stand Pivot Transfers  5: Supervision;4: Min guard    Stand Pivot Transfer Details (indicate cue type and reason)  With RW transport chair to/from [employer]. Toilet to transport chair with grab bar in bathroom CGA. Pt was cued to hold bar and then turn to sit.    Lateral/Scoot Transfers  5: Supervision    Lateral/Scoot Transfer Details (indicate cue type and reason)  transport chair to toilet in bathroom      Ambulation/Gait   Ambulation/Gait  Yes    Ambulation/Gait Assistance  4: Min guard   w/c follow of 2nd person   Ambulation/Gait Assistance Details  Pt was cued to stay up in walker.    Ambulation Distance (Feet)  15 Feet   20' x 1   Assistive device  Rolling  walker    Gait Pattern  Step-to pattern;Decreased step length - right;Decreased step length - left;Poor foot clearance - left;Poor foot clearance - right;Trunk flexed    Ambulation Surface  Level;Indoor    Gait Comments  BP after gait=148/82      Therapeutic Activites    Therapeutic Activities  Other Therapeutic Activities    Other Therapeutic Activities  PT assisted in bathroom to change pants. Standing at toilet with holding to grab bar while PT doffed/donned pants.Pt was able to lift foot to help get in pant leg. Max assist to fasten pants.      Knee/Hip Exercises: Aerobic   Other Aerobic  Sci-Fit level 2.2 legs only x 5 min to warm up.             PT Education - 08/04/19 1506    Education Details  Pt to continue with current HEP    Person(s) Educated  Patient    Methods  Explanation    Comprehension  Verbalized understanding       PT Short Term Goals - 08/04/19 1507      PT SHORT TERM GOAL #1   Title  Pt will be instructed in HEP for strengthening and balance activities to progress functional mobility on own safely with caregiver assist.    Baseline  05/09/19: met with current program. Adding hamstring stretch and standing at sink.    Status  Achieved    Target Date  05/11/19      PT SHORT TERM GOAL #2   Title  Pt will be able to perform sit to stand transfers from varied surfaces supervision for improved mobility.    Baseline  transfer ability varies from supervision to [place] from transport chair and toilet today.08/04/19    Time  4    Period  Weeks    Status  On-going    Target Date  07/18/19   4 weeks from next visit due to delay in schedule     PT SHORT TERM GOAL #3   Title  Pt will ambulate 34' with FWW CGA for improved mobility in home.    Baseline  Gait up to 34' with RW CGA    Time  4    Period  Weeks    Status  On-going    Target Date  07/18/19        PT Long Term Goals -  08/04/19 1508      PT LONG TERM GOAL #1   Title  Pt will increase 30 sec sit  to stand from 4 reps edge of mat to 6 or more for improved functional strength and mobility.    Baseline  not reassessed    Time  8    Period  Weeks    Status  On-going      PT LONG TERM GOAL #2   Title  Pt will be able to ambulate >100' with FWW supervision for improved household distances.    Time  8    Period  Weeks    Status  On-going      PT LONG TERM GOAL #3   Title  Pt will be able to maintain standing x 5 min with minimal UE support for improved balance to assist with ADLs.    Baseline  Pt stood 1-2 min in bathroom holding to grab bar while PT assisted to donn pants    Time  8    Period  Weeks    Status  On-going            Plan - 08/04/19 1509    Clinical Impression Statement  Pt's treatment limited as PT needed to assist in bathroom to get changed. Pt worked on toilet transfers and standing balance while donning/doffing pants. Pt needed max assist to donn pants. Pt's gait ability has varied from 62' to 46' the last couple weeks. Was having more weakness in right knee at visit today per his report and he did not trust to push gait further. Pt has been able to start showing some progress with gait the last couple weeks with more erect posture. Transfers are at supervision/CGA level for sit to stand. Pt continues to benefit from skilled PT to further progress strength, balance and functional mobility to maximize safety in his home.    Personal Factors and Comorbidities  Comorbidity 3+;Age    Examination-Activity Limitations  Bathing;Locomotion Level;Transfers;Stand    Examination-Participation Restrictions  Community Activity    Stability/Clinical Decision Making  Evolving/Moderate complexity    Rehab Potential  Good    PT Frequency  1x / week    PT Duration  8 weeks    PT Treatment/Interventions  ADLs/Self Care Home Management;Cryotherapy;DME Instruction;Moist Heat;Gait training;Stair training;Functional mobility training;Therapeutic activities;Therapeutic exercise;Balance  training;Patient/family education;Neuromuscular re-education;Manual techniques;Passive range of motion    PT Next Visit Plan  LTG check due next visit along with recert. Continue with transfer training, strengthening/stretching to help with more upright posture, standing balance. Gait with RW and chair follow.    PT Home Exercise Plan  [email or web address]    Consulted and Agree with Plan of Care  Patient;Family member/caregiver    Family Member Consulted  wife       Patient will benefit from skilled therapeutic intervention in order to improve the following deficits and impairments:  Abnormal gait, Cardiopulmonary status limiting activity, Decreased activity tolerance, Decreased balance, Difficulty walking, Increased edema, Impaired sensation, Impaired flexibility, Impaired UE functional use, Postural dysfunction, Pain  Visit Diagnosis: Other abnormalities of gait and mobility     Problem List Patient Active Problem List   Diagnosis Date Noted  . PVD (peripheral vascular disease) ([place]) 02/14/2019  . Essential hypertension 02/13/2019  . Non-insulin treated type 2 diabetes mellitus ([place]) 02/13/2019  . Severe sepsis ([place]) 01/17/2019  . Community acquired pneumonia of right lower lobe of lung 01/17/2019  . Postural urinary incontinence 05/20/2018  . Myelopathy concurrent with and due  to spinal stenosis of cervical region ([place]) 05/20/2018  . Myoclonic jerking 05/20/2018  . 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 History of respiratory failure 08/18/2017  . Hypokalemia 08/18/2017  . Leukocytosis 08/18/2017  . Muscle atrophy of lower extremity 08/09/2017  . Bowel and bladder incontinence 08/09/2017  . Right-sided muscle weakness 08/09/2017  . Neuropathy 08/09/2017  . 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/2017  . Abnormality of gait 07/12/2017  . Myoclonic jerkings, 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/2017  . Acquired right foot drop 07/12/2017  . UTI (urinary  tract infection) due to Enterococcus 07/12/2017  . Pressure injury of skin 06/21/2017  . Acute cystitis 06/20/2017  . Generalized weakness 06/20/2017  . Dehydration 06/20/2017  . Dyspnea 12/26/2016  . Chronic cough 12/26/2016  . Hypersomnia with sleep apnea 10/30/2016  . CSA (central sleep apnea) 10/30/2016  . Wheezing symptom 10/30/2016  . 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 RLS (restless legs syndrome) 08/25/2014  . Primary gout 08/25/2014  . OSA on CPAP 08/25/2014  . Severe obesity (BMI >= 40) ([place]) 08/25/2014  . Obesity (BMI 30-39.9) 02/18/2013  . 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 S/P TKR (total knee replacement) 11/29/2012  . History of thyroid cancer     Electa Sniff, PT, DPT, NCS 08/04/2019, 3:15 PM  [facility] [address] [place], [place], [address] Phone: [phone]   Fax:  [phone]  Name: [name] MRN: [id number] Date of Birth: [birth year]

## 2019-08-05 ENCOUNTER — Ambulatory Visit: Payer: Medicare Other

## 2019-08-11 ENCOUNTER — Ambulatory Visit: Payer: Medicare Other

## 2019-08-11 ENCOUNTER — Other Ambulatory Visit: Payer: Self-pay

## 2019-08-11 DIAGNOSIS — R293 Abnormal posture: Secondary | ICD-10-CM

## 2019-08-11 DIAGNOSIS — M6281 Muscle weakness (generalized): Secondary | ICD-10-CM

## 2019-08-11 DIAGNOSIS — R2689 Other abnormalities of gait and mobility: Secondary | ICD-10-CM

## 2019-08-11 NOTE — Therapy (Signed)
[facility] [address] [place], [place], [address] Phone: [phone]   Fax:  [phone]  Physical Therapy Treatment/Recert  Patient Details  Name: [name] MRN: [id number] Date of Birth: Sep 03, 1940 Referring Provider (PT): [name]   Encounter Date: 08/11/2019  PT End of Session - 08/11/19 1403    Visit Number  11    Number of Visits  13    Date for PT Re-Evaluation  10/20/19    Authorization Type  [employer] medicare 10th visit progress note    PT Start Time  1400    PT Stop Time  1444    PT Time Calculation (min)  44 min    Equipment Utilized During Treatment  Gait belt    Activity Tolerance  Patient tolerated treatment well    Behavior During Therapy  [facility] for tasks assessed/performed       Past Medical History:  Diagnosis Date  . Arthritis   . Barrett esophagus   . Bronchitis   . Carotid artery occlusion   . COPD (chronic obstructive pulmonary disease) ([place])   . Diabetes mellitus without complication ([place])   . GERD (gastroesophageal reflux disease)   . Headache    migraines  . History of thyroid cancer 2008  . Hypertension   . Restless leg   . Sleep apnea with use of continuous positive airway pressure (CPAP)    2011 [facility] , AHI  77cn central and obstrcutive. 16 cm water , 3 cm EPR.   . Thoracic ascending aortic aneurysm (HCC)    4.2 cm ascending TAA 08/18/17 CTA. Annual imaging recommended.  [name] Ulcer    Peptic ulcer disease    Past Surgical History:  Procedure Laterality Date  . ANTERIOR CERVICAL DECOMP/DISCECTOMY FUSION N/A 09/03/2017   Procedure: ACDF - C3-C4 - C4-C5 - C5-C6;  Surgeon: Kary Kos, MD;  Location: [facility];  Service: Neurosurgery;  Laterality: N/A;  . CAROTID ENDARTERECTOMY Left January 03, 2006   Dr. Amedeo Plenty  . COLONOSCOPY    . EYE SURGERY Bilateral    cataract surgery with lens implants  . game keepers thumb Right   . JOINT REPLACEMENT Left    knee X 2  . ROTATOR  CUFF REPAIR Right   . THYROIDECTOMY  2008    There were no vitals filed for this visit.  Subjective Assessment - 08/11/19 1401    Subjective  Pt reports that he had a fall on Saturday when was transferring on/off toilet and chair moved. Thinks he forgot to lock w/c. Had skin tear on left thumb and scratch on left knee but denies any other injuries. Rod Can came and got him back up.    Pertinent History  PMH:  79 y.o. male with past medical history significant for coronary artery disease, COPD not on home oxygen, diabetes, restless leg syndrome, OSA on CPAP, CHF. Anterior cervical decomp/disectomy 08/2017    Patient Stated Goals  Pt wants to walk.    Currently in Pain?  Yes    Pain Score  7     Pain Location  Shoulder    Pain Orientation  Left         [facility] PT Assessment - 08/11/19 1404      Assessment   Medical Diagnosis  cervical myelopathy and unsteady gait    Referring Provider (PT)  [name]    Onset Date/Surgical Date  01/17/19  [facility] Adult PT Treatment/Exercise - 08/11/19 1404      Transfers   Transfers  Sit to Stand;Stand to Sit;Lateral/Scoot Transfers    Sit to Stand  5: Supervision;4: Min guard    Sit to Stand Details  Verbal cues for technique    Sit to Stand Details (indicate cue type and reason)  Pt was cued to try to push with one hand from surface he is rising from as well as trying to use more fluid motion as he tends to go up in increments.    Stand to Sit  5: Supervision    Stand to Sit Details (indicate cue type and reason)  Verbal cues for technique    Stand to Sit Details  Verbal cues to reach back for chair    Stand Pivot Transfers  4: Min guard    Stand Pivot Transfer Details (indicate cue type and reason)  with RW mat to transport chair    Lateral/Scoot Transfers  5: Supervision    Lateral/Scoot Transfer Details (indicate cue type and reason)  transport chair to mat    Comments  Pt stood at walker 3 min 4 sec close SBA  with verbal cues to straighten left knee more as he went on. 30 sec sit to stand from mat at RW=4 reps.      Ambulation/Gait   Ambulation/Gait  Yes    Ambulation/Gait Assistance  4: Min guard    Ambulation/Gait Assistance Details  Pt was cued for erect posture and to try to increase right foot clearance    Ambulation Distance (Feet)  30 Feet   30' x 1, then 39' x 1.    Assistive device  Rolling walker   w/c follow   Gait Pattern  Step-to pattern;Poor foot clearance - right    Ambulation Surface  Level;Indoor    Gait Comments  HR=84 after gait      Exercises   Exercises  Other Exercises    Other Exercises   Seated LAQ x 10 with 3# weight. Decreased range on left. Seated hip flexion x 10 bilateral with 3# weight on left.             PT Education - 08/11/19 1559    Education Details  Recert plan. Pt also educated on importance of standing more frequently and spending some time lying intermittently during day to be able to straighten out as sits all day in w/c with feet down. Having continued spasms at night. Explained how stretching out more often may help some as gets so tight from sitting all day.    Person(s) Educated  Patient;Spouse    Methods  Explanation    Comprehension  Verbalized understanding       PT Short Term Goals - 08/11/19 1544      PT SHORT TERM GOAL #1   Title  Pt will be instructed in HEP for strengthening and balance activities to progress functional mobility on own safely with caregiver assist.    Baseline  05/09/19: met with current program. Adding hamstring stretch and standing at sink.    Status  Achieved    Target Date  05/11/19      PT SHORT TERM GOAL #2   Title  Pt will be able to perform sit to stand transfers from varied surfaces supervision for improved mobility.    Baseline  transfer ability varies from supervision to [place] from transport chair and toilet today.08/04/19    Time  4    Period  Weeks    Status  On-going    Target Date  09/10/19    4 weeks from next visit due to delay in schedule     PT SHORT TERM GOAL #3   Title  Pt will ambulate 39' with FWW CGA for improved mobility in home.    Baseline  Varies from 30 to 31' with RW CGA on 08/13/19    Time  4    Period  Weeks    Status  On-going    Target Date  09/10/19        PT Long Term Goals - 08/11/19 1411      PT LONG TERM GOAL #1   Title  Pt will increase 30 sec sit to stand from 4 reps edge of mat to 6 or more for improved functional strength and mobility.    Baseline  4 reps on 08/13/19    Time  8    Period  Weeks    Status  On-going    Target Date  10/10/19      PT LONG TERM GOAL #2   Title  Pt will be able to ambulate >100' with FWW supervision for improved household distances.    Baseline  Pt's gait distance varied from 30' to 74' with RW on 08/13/19 CGA    Time  8    Period  Weeks    Status  On-going    Target Date  10/10/19      PT LONG TERM GOAL #3   Title  Pt will be able to maintain standing x 5 min with minimal UE support for improved balance to assist with ADLs.    Baseline  3 min 4 sec at walker on 08/13/19    Time  8    Period  Weeks    Status  On-going    Target Date  10/10/19      PT LONG TERM GOAL #4   Title  Pt will be able to perform progressive HEP for strengthening, balance, flexibility and walking at home with family/caregiver assist.    Time  8    Period  Weeks    Status  New    Target Date  10/10/19      PT LONG TERM GOAL #5   Title  Pt will ambulate up/down ramp min assist for better access to home with RW.    Time  8    Period  Weeks    Status  New    Target Date  10/10/19            Plan - 08/11/19 1602    Clinical Impression Statement  Pt's LTGs reassessed at visit today. Although he did not meet goals he has continued to show progress since evaluation. Pt is now ambulating 30' to 13' with RW CGA. He is getting more consistent with sit to stand transfers requiring less assistance from supervision to [place]. Pt has  also shown improvements in posture in standing with much more upright posture at trunk and knees. Pt is limited at times with spasms in legs. Pt will continue to benefit from skilled PT to continue to progress strength, balance, flexibility and functional mobility to maximize safety and independence in home.    Personal Factors and Comorbidities  Comorbidity 3+;Age    Examination-Activity Limitations  Bathing;Locomotion Level;Transfers;Stand    Examination-Participation Restrictions  Community Activity    Stability/Clinical Decision Making  Evolving/Moderate complexity    Rehab Potential  Good  PT Frequency  1x / week    PT Duration  8 weeks    PT Treatment/Interventions  ADLs/Self Care Home Management;Cryotherapy;DME Instruction;Moist Heat;Gait training;Stair training;Functional mobility training;Therapeutic activities;Therapeutic exercise;Balance training;Patient/family education;Neuromuscular re-education;Manual techniques;Passive range of motion    PT Next Visit Plan  Has patient been standing more often? Stretching more? Continue with transfer training, strengthening/stretching to help with more upright posture, standing balance. Gait with RW and chair follow.    PT Home Exercise Plan  [id number]    Consulted and Agree with Plan of Care  Patient;Family member/caregiver    Family Member Consulted  wife       Patient will benefit from skilled therapeutic intervention in order to improve the following deficits and impairments:  Abnormal gait, Cardiopulmonary status limiting activity, Decreased activity tolerance, Decreased balance, Difficulty walking, Increased edema, Impaired sensation, Impaired flexibility, Impaired UE functional use, Postural dysfunction, Pain  Visit Diagnosis: Other abnormalities of gait and mobility  Muscle weakness (generalized)  Abnormal posture     Problem List Patient Active Problem List   Diagnosis Date Noted  . PVD (peripheral vascular disease) ([place])  02/14/2019  . Essential hypertension 02/13/2019  . Non-insulin treated type 2 diabetes mellitus ([place]) 02/13/2019  . Severe sepsis ([place]) 01/17/2019  . Community acquired pneumonia of right lower lobe of lung 01/17/2019  . Postural urinary incontinence 05/20/2018  . 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 Myoclonic jerking 05/20/2018  . 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 History of respiratory failure 08/18/2017  . Hypokalemia 08/18/2017  . Leukocytosis 08/18/2017  . Muscle atrophy of lower extremity 08/09/2017  . Bowel and bladder incontinence 08/09/2017  . Right-sided muscle weakness 08/09/2017  . Neuropathy 08/09/2017  . 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/2017  . Abnormality of gait 07/12/2017  . Myoclonic jerkings, 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/2017  . Acquired right foot drop 07/12/2017  . UTI (urinary tract infection) due to Enterococcus 07/12/2017  . Pressure injury of skin 06/21/2017  . Acute cystitis 06/20/2017  . Generalized weakness 06/20/2017  . Dehydration 06/20/2017  . Dyspnea 12/26/2016  . Chronic cough 12/26/2016  . Hypersomnia with sleep apnea 10/30/2016  . CSA (central sleep apnea) 10/30/2016  . Wheezing symptom 10/30/2016  . 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 RLS (restless legs syndrome) 08/25/2014  . Primary gout 08/25/2014  . OSA on CPAP 08/25/2014  . Severe obesity (BMI >= 40) ([place]) 08/25/2014  . Obesity (BMI 30-39.9) 02/18/2013  . 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 S/P TKR (total knee replacement) 11/29/2012  . History of thyroid cancer     Electa Sniff, PT, DPT, NCS 08/11/2019, 4:08 PM  [facility] [address] [place], [place], [address] Phone: [phone]   Fax:  [phone]  Name: [name] MRN:  [id number] Date of Birth: 11-Aug-1940

## 2019-08-12 ENCOUNTER — Ambulatory Visit: Payer: Medicare Other

## 2019-08-19 ENCOUNTER — Ambulatory Visit: Payer: Medicare Other | Attending: Internal Medicine

## 2019-08-19 DIAGNOSIS — Z23 Encounter for immunization: Secondary | ICD-10-CM

## 2019-08-19 NOTE — Progress Notes (Signed)
   Covid-19 Vaccination Clinic  Name:  [name]    MRN: [id number] DOB: 17-Aug-1940  08/19/2019  [name] was observed post Covid-19 immunization for 15 minutes without incident. He was provided with Vaccine Information Sheet and instruction to access the V-Safe system.   [name] was instructed to call 911 with any severe reactions post vaccine: [name] Difficulty breathing  . Swelling of face and throat  . A fast heartbeat  . A bad rash all over body  . Dizziness and weakness   Immunizations Administered    Name Date Dose VIS Date Route   Pfizer COVID-19 Vaccine 08/19/2019 12:09 PM 0.3 mL 05/30/2019 Intramuscular   Manufacturer: [place]   Lot: [id number]   [place]: [id number]

## 2019-08-20 ENCOUNTER — Ambulatory Visit: Payer: Medicare Other | Attending: Internal Medicine

## 2019-08-20 ENCOUNTER — Other Ambulatory Visit: Payer: Self-pay

## 2019-08-20 DIAGNOSIS — R2689 Other abnormalities of gait and mobility: Secondary | ICD-10-CM | POA: Insufficient documentation

## 2019-08-20 DIAGNOSIS — R293 Abnormal posture: Secondary | ICD-10-CM | POA: Insufficient documentation

## 2019-08-20 DIAGNOSIS — R2681 Unsteadiness on feet: Secondary | ICD-10-CM | POA: Insufficient documentation

## 2019-08-20 DIAGNOSIS — M6281 Muscle weakness (generalized): Secondary | ICD-10-CM | POA: Insufficient documentation

## 2019-08-20 NOTE — Therapy (Addendum)
[facility] [address] [place], [place], [address] Phone: [phone]   Fax:  [phone]  Physical Therapy Treatment  Patient Details  Name: [name] MRN: [id number] Date of Birth: 06/17/1941 Referring Provider (PT): [name]   Encounter Date: 08/20/2019  PT End of Session - 08/20/19 1235    Visit Number  12    Number of Visits  19    Date for PT Re-Evaluation  10/20/19    Authorization Type  [employer] medicare 10th visit progress note    PT Start Time  1230    PT Stop Time  1310    PT Time Calculation (min)  40 min    Equipment Utilized During Treatment  Gait belt    Activity Tolerance  Patient tolerated treatment well    Behavior During Therapy  [facility] for tasks assessed/performed       Past Medical History:  Diagnosis Date  . Arthritis   . Barrett esophagus   . Bronchitis   . Carotid artery occlusion   . COPD (chronic obstructive pulmonary disease) ([place])   . Diabetes mellitus without complication ([place])   . GERD (gastroesophageal reflux disease)   . Headache    migraines  . History of thyroid cancer 2008  . Hypertension   . Restless leg   . Sleep apnea with use of continuous positive airway pressure (CPAP)    [facility] , AHI  77cn central and obstrcutive. 16 cm water , 3 cm EPR.   . Thoracic ascending aortic aneurysm (HCC)    4.2 cm ascending TAA 08/18/17 CTA. Annual imaging recommended.  [name] Ulcer    Peptic ulcer disease    Past Surgical History:  Procedure Laterality Date  . ANTERIOR CERVICAL DECOMP/DISCECTOMY FUSION N/A 09/03/2017   Procedure: ACDF - C3-C4 - C4-C5 - C5-C6;  Surgeon: Kary Kos, MD;  Location: [facility];  Service: Neurosurgery;  Laterality: N/A;  . CAROTID ENDARTERECTOMY Left January 03, 2006   Dr. Amedeo Plenty  . COLONOSCOPY    . EYE SURGERY Bilateral    cataract surgery with lens implants  . game keepers thumb Right   . JOINT REPLACEMENT Left    knee X 2  . ROTATOR CUFF REPAIR  Right   . THYROIDECTOMY  2008    There were no vitals filed for this visit.  Subjective Assessment - 08/20/19 1233    Subjective  Pt reports that he got his 2nd Covid vaccine yesterday. Doing well with it. Left side had more spasms last night. Has not walked at all at home.    Pertinent History  PMH:  79 y.o. male with past medical history significant for coronary artery disease, COPD not on home oxygen, diabetes, restless leg syndrome, OSA on CPAP, CHF. Anterior cervical decomp/disectomy 08/2017    Patient Stated Goals  Pt wants to walk.    Currently in Pain?  Yes    Pain Location  Shoulder    Pain Orientation  Right;Left    Pain Descriptors / Indicators  Aching    Pain Type  Chronic pain    Pain Onset  More than a month ago    Pain Frequency  Intermittent                       [facility] Adult PT Treatment/Exercise - 08/20/19 1236      Transfers   Transfers  Sit to Stand    Sit to Stand  5: Supervision  Sit to Stand Details  Verbal cues for technique    Sit to Stand Details (indicate cue type and reason)  Pt was cued to push from chair/mat with 1 hand and only have 1 hand on walker. Cued to lean forward and use momentum to help rise.    Five time sit to stand comments   x 5 from edge of mat with pushing with 1 hand on mat and 1 on walker getting balance each time supervision. Verbal cues to control descent.    Stand to Sit  5: Supervision      Ambulation/Gait   Ambulation/Gait  Yes    Ambulation/Gait Assistance  4: Min guard    Ambulation/Gait Assistance Details  Pt reported left knee felt like it was going to give out. Verbal cues to stay up in walker for erect posture.    Ambulation Distance (Feet)  15 Feet   15'x1   Assistive device  Rolling walker   w/c follow   Gait Pattern  Step-to pattern;Decreased step length - right;Decreased step length - left;Poor foot clearance - left;Poor foot clearance - right    Ambulation Surface  Level;Indoor      Neuro Re-ed     Neuro Re-ed Details   standing at walker: Verbal cues for erect posture then alternating arm lifts x 5 each side 3 bouts with seated rest in between. Had more difficulty when raised left hand with right knee giving. One episode of knees buckling. CGA with PT in front of knees for safety.      Exercises   Exercises  Other Exercises    Other Exercises   Seated LAQ x 10 bilateral then added 2# weight to right and 3# weight to left 10 x 2. Seated march with 2# on right and 3 # on left 10 x 2             PT Education - 08/20/19 1649    Education Details  Pt instructed to work on gait at home with aide and w/c follow.    Person(s) Educated  Patient;Spouse    Methods  Explanation    Comprehension  Verbalized understanding       PT Short Term Goals - 08/11/19 1544      PT SHORT TERM GOAL #1   Title  Pt will be instructed in HEP for strengthening and balance activities to progress functional mobility on own safely with caregiver assist.    Baseline  05/09/19: met with current program. Adding hamstring stretch and standing at sink.    Status  Achieved    Target Date  05/11/19      PT SHORT TERM GOAL #2   Title  Pt will be able to perform sit to stand transfers from varied surfaces supervision for improved mobility.    Baseline  transfer ability varies from supervision to [place] from transport chair and toilet today.08/04/19    Time  4    Period  Weeks    Status  On-going    Target Date  09/10/19   4 weeks from next visit due to delay in schedule     PT SHORT TERM GOAL #3   Title  Pt will ambulate 33' with FWW CGA for improved mobility in home.    Baseline  Varies from 30 to 75' with RW CGA on 08/13/19    Time  4    Period  Weeks    Status  On-going    Target Date  09/10/19  PT Long Term Goals - 08/11/19 1411      PT LONG TERM GOAL #1   Title  Pt will increase 30 sec sit to stand from 4 reps edge of mat to 6 or more for improved functional strength and mobility.     Baseline  4 reps on 08/13/19    Time  8    Period  Weeks    Status  On-going    Target Date  10/10/19      PT LONG TERM GOAL #2   Title  Pt will be able to ambulate >100' with FWW supervision for improved household distances.    Baseline  Pt's gait distance varied from 30' to 8' with RW on 08/13/19 CGA    Time  8    Period  Weeks    Status  On-going    Target Date  10/10/19      PT LONG TERM GOAL #3   Title  Pt will be able to maintain standing x 5 min with minimal UE support for improved balance to assist with ADLs.    Baseline  3 min 4 sec at walker on 08/13/19    Time  8    Period  Weeks    Status  On-going    Target Date  10/10/19      PT LONG TERM GOAL #4   Title  Pt will be able to perform progressive HEP for strengthening, balance, flexibility and walking at home with family/caregiver assist.    Time  8    Period  Weeks    Status  New    Target Date  10/10/19      PT LONG TERM GOAL #5   Title  Pt will ambulate up/down ramp min assist for better access to home with RW.    Time  8    Period  Weeks    Status  New    Target Date  10/10/19            Plan - 08/20/19 1650    Clinical Impression Statement  Pt was able to improve sit to stand pushing from mat with 1 hand after practice with more fluid movement. Pt was reporting increased feeling of weakness in left leg today and not trusting with gait although PT did not note buckling on left. Right LE buckled in standing 1 time. Pt reports standing more at home but has not been walking at all which would affect carryover.    Personal Factors and Comorbidities  Comorbidity 3+;Age    Examination-Activity Limitations  Bathing;Locomotion Level;Transfers;Stand    Examination-Participation Restrictions  Community Activity    Stability/Clinical Decision Making  Evolving/Moderate complexity    Rehab Potential  Good    PT Frequency  1x / week    PT Duration  8 weeks    PT Treatment/Interventions  ADLs/Self Care Home  Management;Cryotherapy;DME Instruction;Moist Heat;Gait training;Stair training;Functional mobility training;Therapeutic activities;Therapeutic exercise;Balance training;Patient/family education;Neuromuscular re-education;Manual techniques;Passive range of motion    PT Next Visit Plan  Continue with transfer training, strengthening/stretching to help with more upright posture, standing balance. Gait with RW and chair follow.    PT Home Exercise Plan  [email or web address]    Consulted and Agree with Plan of Care  Patient;Family member/caregiver    Family Member Consulted  wife       Patient will benefit from skilled therapeutic intervention in order to improve the following deficits and impairments:  Abnormal gait, Cardiopulmonary status limiting activity, Decreased activity tolerance, Decreased  balance, Difficulty walking, Increased edema, Impaired sensation, Impaired flexibility, Impaired UE functional use, Postural dysfunction, Pain  Visit Diagnosis: Other abnormalities of gait and mobility  Muscle weakness (generalized)     Problem List Patient Active Problem List   Diagnosis Date Noted  . PVD (peripheral vascular disease) ([place]) 02/14/2019  . Essential hypertension 02/13/2019  . Non-insulin treated type 2 diabetes mellitus ([place]) 02/13/2019  . Severe sepsis ([place]) 01/17/2019  . Community acquired pneumonia of right lower lobe of lung 01/17/2019  . Postural urinary incontinence 05/20/2018  . 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 Myoclonic jerking 05/20/2018  . 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 History of respiratory failure 08/18/2017  . Hypokalemia 08/18/2017  . Leukocytosis 08/18/2017  . Muscle atrophy of lower extremity 08/09/2017  . Bowel and bladder incontinence 08/09/2017  . Right-sided muscle weakness 08/09/2017  . Neuropathy 08/09/2017  . Neurogenic claudication due to lumbar spinal  stenosis 08/09/2017  . Abnormality of gait 07/12/2017  . Myoclonic jerkings, 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/2017  . Acquired right foot drop 07/12/2017  . UTI (urinary tract infection) due to Enterococcus 07/12/2017  . Pressure injury of skin 06/21/2017  . Acute cystitis 06/20/2017  . Generalized weakness 06/20/2017  . Dehydration 06/20/2017  . Dyspnea 12/26/2016  . Chronic cough 12/26/2016  . Hypersomnia with sleep apnea 10/30/2016  . CSA (central sleep apnea) 10/30/2016  . Wheezing symptom 10/30/2016  . 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 RLS (restless legs syndrome) 08/25/2014  . Primary gout 08/25/2014  . OSA on CPAP 08/25/2014  . Severe obesity (BMI >= 40) ([place]) 08/25/2014  . Obesity (BMI 30-39.9) 02/18/2013  . 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 S/P TKR (total knee replacement) 11/29/2012  . History of thyroid cancer     Electa Sniff, PT, DPT, NCS 08/20/2019, 4:52 PM  [facility] [address] [place], [place], [address] Phone: [phone]   Fax:  [phone]  Name: [name] MRN: [id number] Date of Birth: 12/22/1940

## 2019-08-27 ENCOUNTER — Telehealth: Payer: Self-pay | Admitting: Cardiology

## 2019-08-27 ENCOUNTER — Ambulatory Visit: Payer: Medicare Other | Admitting: Physical Therapy

## 2019-08-27 ENCOUNTER — Other Ambulatory Visit: Payer: Self-pay

## 2019-08-27 ENCOUNTER — Encounter: Payer: Self-pay | Admitting: Physical Therapy

## 2019-08-27 DIAGNOSIS — R2681 Unsteadiness on feet: Secondary | ICD-10-CM

## 2019-08-27 DIAGNOSIS — M6281 Muscle weakness (generalized): Secondary | ICD-10-CM

## 2019-08-27 DIAGNOSIS — R293 Abnormal posture: Secondary | ICD-10-CM

## 2019-08-27 DIAGNOSIS — R2689 Other abnormalities of gait and mobility: Secondary | ICD-10-CM

## 2019-08-27 NOTE — Telephone Encounter (Signed)
Please have him increase his Lasix to 40 mg BID for 3 days- one in the morning and one around 2pm.  Arrange virtual f/u with me in a week.  Kerin Ransom PA-C 08/27/2019 1:30 PM

## 2019-08-27 NOTE — Telephone Encounter (Signed)
Pt aware of recommendations and agrees with plan vv scheduled for 09/04/19 at 9:45 am with Kerin Ransom PA .[facility] [employer]

## 2019-08-27 NOTE — Telephone Encounter (Signed)
Per pt noted swelling to feet no other symptoms and per pt swelling was there upon getting up this am after being elevated Pt also notes 4 lb weight gain in a week B/P okay and has not increased salt intake  Pt currently taking Furosemide 40 mg qd and Spironalactone 25 mg for fluid Will forward to [employer] PA for review and recommendations .,cy

## 2019-08-27 NOTE — Telephone Encounter (Signed)
Pt c/o swelling: STAT is pt has developed SOB within 24 hours  1) How much weight have you gained and in what time span? Gained 4lbs in 1 week  2) If swelling, where is the swelling located? Feet feel swollen **  3) Are you currently taking a fluid pill? yes  4) Are you currently SOB? no  5) Do you have a log of your daily weights (if so, list)? yes  6) Have you gained 3 pounds in a day or 5 pounds in a week?   7) Have you traveled recently? no

## 2019-08-28 NOTE — Therapy (Signed)
[name] [address] [place], [place], [address] Phone: [phone]   Fax:  [phone]  Physical Therapy Treatment  Patient Details  Name: [name] MRN: [id number] Date of Birth: Nov 13, 1940 Referring Provider (PT): [name]   Encounter Date: 08/27/2019  PT End of Session - 08/27/19 1405    Visit Number  13    Number of Visits  19    Date for PT Re-Evaluation  10/20/19    Authorization Type  [employer] medicare 10th visit progress note    PT Start Time  1402    PT Stop Time  1445    PT Time Calculation (min)  43 min    Equipment Utilized During Treatment  Gait belt    Activity Tolerance  Patient tolerated treatment well    Behavior During Therapy  [facility] for tasks assessed/performed       Past Medical History:  Diagnosis Date  . Arthritis   . Barrett esophagus   . Bronchitis   . Carotid artery occlusion   . COPD (chronic obstructive pulmonary disease) ([place])   . Diabetes mellitus without complication ([place])   . GERD (gastroesophageal reflux disease)   . Headache    migraines  . History of thyroid cancer 2008  . Hypertension   . Restless leg   . Sleep apnea with use of continuous positive airway pressure (CPAP)    [facility] , AHI  77cn central and obstrcutive. 16 cm water , 3 cm EPR.   . Thoracic ascending aortic aneurysm (HCC)    4.2 cm ascending TAA 08/18/17 CTA. Annual imaging recommended.  [name] Ulcer    Peptic ulcer disease    Past Surgical History:  Procedure Laterality Date  . ANTERIOR CERVICAL DECOMP/DISCECTOMY FUSION N/A 09/03/2017   Procedure: ACDF - C3-C4 - C4-C5 - C5-C6;  Surgeon: Kary Kos, MD;  Location: [place];  Service: Neurosurgery;  Laterality: N/A;  . CAROTID ENDARTERECTOMY Left January 03, 2006   Dr. Amedeo Plenty  . COLONOSCOPY    . EYE SURGERY Bilateral    cataract surgery with lens implants  . game keepers thumb Right   . JOINT REPLACEMENT Left    knee X 2  . ROTATOR CUFF  REPAIR Right   . THYROIDECTOMY  2008    There were no vitals filed for this visit.  Subjective Assessment - 08/27/19 1404    Subjective  No falls or pain to report. Per spouse he will be taking a double dose of Lasix for 3 days due to mild fluid overload. Does report his breathing in "good".    Pertinent History  PMH:  79 y.o. male with past medical history significant for coronary artery disease, COPD not on home oxygen, diabetes, restless leg syndrome, OSA on CPAP, CHF. Anterior cervical decomp/disectomy 08/2017    Patient Stated Goals  Pt wants to walk.    Currently in Pain?  No/denies    Pain Score  0-No pain            [facility] Adult PT Treatment/Exercise - 08/27/19 1406      Transfers   Transfers  Sit to Stand;Stand to Sit    Sit to Stand  5: Supervision;With upper extremity assist;From chair/3-in-1    Sit to Stand Details (indicate cue type and reason)  increased time and effort needed to stand with one hand on surface and one hand on walker    Stand to Sit  5: Supervision;With upper extremity assist;To chair/3-in-1  Stand to Sit Details  cues for use of arms/legs for controlled descent.     Stand Pivot Transfers  4: Min guard    Stand Pivot Transfer Details (indicate cue type and reason)  with RW to transfer from transport chair to Scifit.       Ambulation/Gait   Ambulation/Gait  Yes    Ambulation/Gait Assistance  4: Min assist;4: Min guard    Ambulation/Gait Assistance Details  cues needed for posture, knee extension in stance and for step length with gait.    Ambulation Distance (Feet)  35 Feet   x1, 15 x1   Assistive device  Rolling walker    Gait Pattern  Step-to pattern;Decreased step length - right;Decreased step length - left;Poor foot clearance - left;Poor foot clearance - right    Ambulation Surface  Level;Indoor      Neuro Re-ed    Neuro Re-ed Details   for strengthening/balance/muscle re-ed: standing at RW- alernating UE raises for 10 reps each side, then pt  needed a seated rest break. Then upper trunk rotation with reaching back for 12 reps total each side, seated rest breaks needed every 3-4 reps each side. min guard to min assist for balance.        Knee/Hip Exercises: Aerobic   Other Aerobic  Scifit level 4.0 LE's only for 6 minutes with emphasis on full knee extension.             PT Short Term Goals - 08/11/19 1544      PT SHORT TERM GOAL #1   Title  Pt will be instructed in HEP for strengthening and balance activities to progress functional mobility on own safely with caregiver assist.    Baseline  05/09/19: met with current program. Adding hamstring stretch and standing at sink.    Status  Achieved    Target Date  05/11/19      PT SHORT TERM GOAL #2   Title  Pt will be able to perform sit to stand transfers from varied surfaces supervision for improved mobility.    Baseline  transfer ability varies from supervision to [place] from transport chair and toilet today.08/04/19    Time  4    Period  Weeks    Status  On-going    Target Date  09/10/19   4 weeks from next visit due to delay in schedule     PT SHORT TERM GOAL #3   Title  Pt will ambulate 13' with FWW CGA for improved mobility in home.    Baseline  Varies from 30 to 74' with RW CGA on 08/13/19    Time  4    Period  Weeks    Status  On-going    Target Date  09/10/19        PT Long Term Goals - 08/11/19 1411      PT LONG TERM GOAL #1   Title  Pt will increase 30 sec sit to stand from 4 reps edge of mat to 6 or more for improved functional strength and mobility.    Baseline  4 reps on 08/13/19    Time  8    Period  Weeks    Status  On-going    Target Date  10/10/19      PT LONG TERM GOAL #2   Title  Pt will be able to ambulate >100' with FWW supervision for improved household distances.    Baseline  Pt's gait distance varied from 30' to 72' with  RW on 08/13/19 CGA    Time  8    Period  Weeks    Status  On-going    Target Date  10/10/19      PT LONG TERM GOAL  #3   Title  Pt will be able to maintain standing x 5 min with minimal UE support for improved balance to assist with ADLs.    Baseline  3 min 4 sec at walker on 08/13/19    Time  8    Period  Weeks    Status  On-going    Target Date  10/10/19      PT LONG TERM GOAL #4   Title  Pt will be able to perform progressive HEP for strengthening, balance, flexibility and walking at home with family/caregiver assist.    Time  8    Period  Weeks    Status  New    Target Date  10/10/19      PT LONG TERM GOAL #5   Title  Pt will ambulate up/down ramp min assist for better access to home with RW.    Time  8    Period  Weeks    Status  New    Target Date  10/10/19            Plan - 08/27/19 1405    Clinical Impression Statement  Today's skilled session continued to focus on strengthening, activity tolerance and gait with RW. Rest breaks needed due to fatigue. Pt was able to increase his overall gait distance this session.    Personal Factors and Comorbidities  Comorbidity 3+;Age    Examination-Activity Limitations  Bathing;Locomotion Level;Transfers;Stand    Examination-Participation Restrictions  Community Activity    Stability/Clinical Decision Making  Evolving/Moderate complexity    Rehab Potential  Good    PT Frequency  1x / week    PT Duration  8 weeks    PT Treatment/Interventions  ADLs/Self Care Home Management;Cryotherapy;DME Instruction;Moist Heat;Gait training;Stair training;Functional mobility training;Therapeutic activities;Therapeutic exercise;Balance training;Patient/family education;Neuromuscular re-education;Manual techniques;Passive range of motion    PT Next Visit Plan  Continue with transfer training, strengthening/stretching to help with more upright posture, standing balance. Gait with RW and chair follow.    PT Home Exercise Plan  [email or web address]    Consulted and Agree with Plan of Care  Patient;Family member/caregiver    Family Member Consulted  wife       Patient will  benefit from skilled therapeutic intervention in order to improve the following deficits and impairments:  Abnormal gait, Cardiopulmonary status limiting activity, Decreased activity tolerance, Decreased balance, Difficulty walking, Increased edema, Impaired sensation, Impaired flexibility, Impaired UE functional use, Postural dysfunction, Pain  Visit Diagnosis: Other abnormalities of gait and mobility  Muscle weakness (generalized)  Abnormal posture  Unsteadiness on feet     Problem List Patient Active Problem List   Diagnosis Date Noted  . PVD (peripheral vascular disease) ([place]) 02/14/2019  . Essential hypertension 02/13/2019  . Non-insulin treated type 2 diabetes mellitus ([place]) 02/13/2019  . Severe sepsis ([place]) 01/17/2019  . Community acquired pneumonia of right lower lobe of lung 01/17/2019  . Postural urinary incontinence 05/20/2018  . 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 Myoclonic jerking 05/20/2018  . 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 History of respiratory failure 08/18/2017  . Hypokalemia 08/18/2017  . Leukocytosis 08/18/2017  . Muscle atrophy of lower extremity 08/09/2017  . Bowel and bladder incontinence  08/09/2017  . Right-sided muscle weakness 08/09/2017  . Neuropathy 08/09/2017  . 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/2017  . Abnormality of gait 07/12/2017  . Myoclonic jerkings, 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/2017  . Acquired right foot drop 07/12/2017  . UTI (urinary tract infection) due to Enterococcus 07/12/2017  . Pressure injury of skin 06/21/2017  . Acute cystitis 06/20/2017  . Generalized weakness 06/20/2017  . Dehydration 06/20/2017  . Dyspnea 12/26/2016  . Chronic cough 12/26/2016  . Hypersomnia with sleep apnea 10/30/2016  . CSA (central sleep apnea) 10/30/2016  . Wheezing symptom 10/30/2016  . 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 RLS  (restless legs syndrome) 08/25/2014  . Primary gout 08/25/2014  . OSA on CPAP 08/25/2014  . Severe obesity (BMI >= 40) ([place]) 08/25/2014  . Obesity (BMI 30-39.9) 02/18/2013  . 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 S/P TKR (total knee replacement) 11/29/2012  . History of thyroid cancer     Willow Ora, PTA, [facility] [address], [place], [place] [address] [phone] 08/28/19, 1:58 PM   Name: [name] MRN: [id number] Date of Birth: 1941/04/11

## 2019-09-03 ENCOUNTER — Ambulatory Visit: Payer: Medicare Other

## 2019-09-03 ENCOUNTER — Other Ambulatory Visit: Payer: Self-pay

## 2019-09-03 DIAGNOSIS — R2689 Other abnormalities of gait and mobility: Secondary | ICD-10-CM

## 2019-09-03 DIAGNOSIS — M6281 Muscle weakness (generalized): Secondary | ICD-10-CM

## 2019-09-03 NOTE — Therapy (Signed)
[facility] [address] [place], [place], [address] Phone: [phone]   Fax:  [phone]  Physical Therapy Treatment  Patient Details  Name: [name] MRN: [id number] Date of Birth: 10-Aug-1940 Referring Provider (PT): [name]   Encounter Date: 09/03/2019  PT End of Session - 09/03/19 1541    Visit Number  14    Number of Visits  19    Date for PT Re-Evaluation  10/20/19    Authorization Type  [employer] medicare 10th visit progress note    PT Start Time  1533    PT Stop Time  1615    PT Time Calculation (min)  42 min    Equipment Utilized During Treatment  Gait belt    Activity Tolerance  Patient tolerated treatment well    Behavior During Therapy  [facility] for tasks assessed/performed       Past Medical History:  Diagnosis Date  . Arthritis   . Barrett esophagus   . Bronchitis   . Carotid artery occlusion   . COPD (chronic obstructive pulmonary disease) ([place])   . Diabetes mellitus without complication ([place])   . GERD (gastroesophageal reflux disease)   . Headache    migraines  . History of thyroid cancer 2008  . Hypertension   . Restless leg   . Sleep apnea with use of continuous positive airway pressure (CPAP)    [facility] , AHI  77cn central and obstrcutive. 16 cm water , 3 cm EPR.   . Thoracic ascending aortic aneurysm (HCC)    4.2 cm ascending TAA 08/18/17 CTA. Annual imaging recommended.  [name] Ulcer    Peptic ulcer disease    Past Surgical History:  Procedure Laterality Date  . ANTERIOR CERVICAL DECOMP/DISCECTOMY FUSION N/A 09/03/2017   Procedure: ACDF - C3-C4 - C4-C5 - C5-C6;  Surgeon: Kary Kos, MD;  Location: [facility];  Service: Neurosurgery;  Laterality: N/A;  . CAROTID ENDARTERECTOMY Left January 03, 2006   Dr. Amedeo Plenty  . COLONOSCOPY    . EYE SURGERY Bilateral    cataract surgery with lens implants  . game keepers thumb Right   . JOINT REPLACEMENT Left    knee X 2  . ROTATOR CUFF  REPAIR Right   . THYROIDECTOMY  2008    There were no vitals filed for this visit.  Subjective Assessment - 09/03/19 1538    Subjective  Pt reports that he is trying the colchicine again to see if that helps.    Pertinent History  PMH:  79 y.o. male with past medical history significant for coronary artery disease, COPD not on home oxygen, diabetes, restless leg syndrome, OSA on CPAP, CHF. Anterior cervical decomp/disectomy 08/2017    Patient Stated Goals  Pt wants to walk.    Currently in Pain?  Yes   still gets spasms at night,   Pain Location  Foot    Pain Orientation  Left;Lateral    Pain Descriptors / Indicators  Sharp    Pain Type  Chronic pain    Aggravating Factors   at night                       [facility] Adult PT Treatment/Exercise - 09/03/19 1541      Transfers   Transfers  Sit to Stand;Stand to Sit    Sit to Stand  5: Supervision    Sit to Stand Details (indicate cue type and reason)  increased time  to rise reaching for walker cross bar once started going up but did push from chair to start today.    Stand to Sit  5: Supervision    Stand Pivot Transfers  4: Min guard    Stand Pivot Transfer Details (indicate cue type and reason)  transport chair to/from SciFit with RW    Comments  Sit to stand 5 x 2 from mat with verbal cues to push from mat with 1 hand and 1 on walker close SBA. Pt was also cued to try to increase forward lean and use momentum to help increase speed of rising. Pt had one episode of left knee buckling with decreased control with descent.      Ambulation/Gait   Ambulation/Gait  Yes    Ambulation/Gait Assistance  4: Min guard    Ambulation/Gait Assistance Details  Pt was given verbal cues to stay up in walker and stand erect.  HR=80 after gait    Ambulation Distance (Feet)  115 Feet    Assistive device  Rolling walker   w/c follow   Gait Pattern  Step-to pattern;Decreased step length - right;Decreased step length - left;Poor foot clearance -  left;Poor foot clearance - right    Ambulation Surface  Level;Indoor      Knee/Hip Exercises: Aerobic   Other Aerobic  SciFit level 4 legs only x 6 min             PT Education - 09/03/19 2100    Education Details  Pt was instructed on importance of trying to walk at home as well.    Person(s) Educated  Patient;Spouse    Methods  Explanation    Comprehension  Verbalized understanding       PT Short Term Goals - 08/11/19 1544      PT SHORT TERM GOAL #1   Title  Pt will be instructed in HEP for strengthening and balance activities to progress functional mobility on own safely with caregiver assist.    Baseline  05/09/19: met with current program. Adding hamstring stretch and standing at sink.    Status  Achieved    Target Date  05/11/19      PT SHORT TERM GOAL #2   Title  Pt will be able to perform sit to stand transfers from varied surfaces supervision for improved mobility.    Baseline  transfer ability varies from supervision to [place] from transport chair and toilet today.08/04/19    Time  4    Period  Weeks    Status  On-going    Target Date  09/10/19   4 weeks from next visit due to delay in schedule     PT SHORT TERM GOAL #3   Title  Pt will ambulate 41' with FWW CGA for improved mobility in home.    Baseline  Varies from 30 to 30' with RW CGA on 08/13/19    Time  4    Period  Weeks    Status  On-going    Target Date  09/10/19        PT Long Term Goals - 08/11/19 1411      PT LONG TERM GOAL #1   Title  Pt will increase 30 sec sit to stand from 4 reps edge of mat to 6 or more for improved functional strength and mobility.    Baseline  4 reps on 08/13/19    Time  8    Period  Weeks    Status  On-going  Target Date  10/10/19      PT LONG TERM GOAL #2   Title  Pt will be able to ambulate >100' with FWW supervision for improved household distances.    Baseline  Pt's gait distance varied from 30' to 83' with RW on 08/13/19 CGA    Time  8    Period  Weeks     Status  On-going    Target Date  10/10/19      PT LONG TERM GOAL #3   Title  Pt will be able to maintain standing x 5 min with minimal UE support for improved balance to assist with ADLs.    Baseline  3 min 4 sec at walker on 08/13/19    Time  8    Period  Weeks    Status  On-going    Target Date  10/10/19      PT LONG TERM GOAL #4   Title  Pt will be able to perform progressive HEP for strengthening, balance, flexibility and walking at home with family/caregiver assist.    Time  8    Period  Weeks    Status  New    Target Date  10/10/19      PT LONG TERM GOAL #5   Title  Pt will ambulate up/down ramp min assist for better access to home with RW.    Time  8    Period  Weeks    Status  New    Target Date  10/10/19            Plan - 09/03/19 2100    Clinical Impression Statement  Pt was able to increase gait distance significantly today. Pt showing improvements with sit to stand with more fluid movement with practice.    Personal Factors and Comorbidities  Comorbidity 3+;Age    Examination-Activity Limitations  Bathing;Locomotion Level;Transfers;Stand    Examination-Participation Restrictions  Community Activity    Stability/Clinical Decision Making  Evolving/Moderate complexity    Rehab Potential  Good    PT Frequency  1x / week    PT Duration  8 weeks    PT Treatment/Interventions  ADLs/Self Care Home Management;Cryotherapy;DME Instruction;Moist Heat;Gait training;Stair training;Functional mobility training;Therapeutic activities;Therapeutic exercise;Balance training;Patient/family education;Neuromuscular re-education;Manual techniques;Passive range of motion    PT Next Visit Plan  Check STGs next week. Continue with transfer training, strengthening/stretching to help with more upright posture, standing balance. Gait with RW and chair follow.    PT Home Exercise Plan  [email or web address]    Consulted and Agree with Plan of Care  Patient;Family member/caregiver    Family Member  Consulted  wife       Patient will benefit from skilled therapeutic intervention in order to improve the following deficits and impairments:  Abnormal gait, Cardiopulmonary status limiting activity, Decreased activity tolerance, Decreased balance, Difficulty walking, Increased edema, Impaired sensation, Impaired flexibility, Impaired UE functional use, Postural dysfunction, Pain  Visit Diagnosis: Other abnormalities of gait and mobility  Muscle weakness (generalized)     Problem List Patient Active Problem List   Diagnosis Date Noted  . PVD (peripheral vascular disease) ([place]) 02/14/2019  . Essential hypertension 02/13/2019  . Non-insulin treated type 2 diabetes mellitus ([place]) 02/13/2019  . Severe sepsis ([place]) 01/17/2019  . Community acquired pneumonia of right lower lobe of lung 01/17/2019  . Postural urinary incontinence 05/20/2018  . 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 Myoclonic jerking 05/20/2018  . Myelopathy of cervical spinal  cord with cervical radiculopathy 09/03/2017  . History of respiratory failure 08/18/2017  . Hypokalemia 08/18/2017  . Leukocytosis 08/18/2017  . Muscle atrophy of lower extremity 08/09/2017  . Bowel and bladder incontinence 08/09/2017  . Right-sided muscle weakness 08/09/2017  . Neuropathy 08/09/2017  . 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/2017  . Abnormality of gait 07/12/2017  . Myoclonic jerkings, 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/2017  . Acquired right foot drop 07/12/2017  . UTI (urinary tract infection) due to Enterococcus 07/12/2017  . Pressure injury of skin 06/21/2017  . Acute cystitis 06/20/2017  . Generalized weakness 06/20/2017  . Dehydration 06/20/2017  . Dyspnea 12/26/2016  . Chronic cough 12/26/2016  . Hypersomnia with sleep apnea 10/30/2016  . CSA (central sleep apnea) 10/30/2016  . Wheezing symptom 10/30/2016  . 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 RLS  (restless legs syndrome) 08/25/2014  . Primary gout 08/25/2014  . OSA on CPAP 08/25/2014  . Severe obesity (BMI >= 40) ([place]) 08/25/2014  . Obesity (BMI 30-39.9) 02/18/2013  . 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 S/P TKR (total knee replacement) 11/29/2012  . History of thyroid cancer     Electa Sniff, PT, DPT, NCS 09/03/2019, 9:03 PM  [facility] [address] [place], [place], [address] Phone: [phone]   Fax:  [phone]  Name: [name] MRN: [id number] Date of Birth: [birth year]

## 2019-09-04 ENCOUNTER — Encounter: Payer: Self-pay | Admitting: Cardiology

## 2019-09-04 ENCOUNTER — Telehealth (INDEPENDENT_AMBULATORY_CARE_PROVIDER_SITE_OTHER): Payer: Medicare Other | Admitting: Cardiology

## 2019-09-04 VITALS — BP 119/73 | Ht 67.0 in | Wt 203.0 lb

## 2019-09-04 DIAGNOSIS — G992 Myelopathy in diseases classified elsewhere: Secondary | ICD-10-CM

## 2019-09-04 DIAGNOSIS — M4802 Spinal stenosis, cervical region: Secondary | ICD-10-CM

## 2019-09-04 DIAGNOSIS — Z9989 Dependence on other enabling machines and devices: Secondary | ICD-10-CM

## 2019-09-04 DIAGNOSIS — G4733 Obstructive sleep apnea (adult) (pediatric): Secondary | ICD-10-CM

## 2019-09-04 DIAGNOSIS — Z8709 Personal history of other diseases of the respiratory system: Secondary | ICD-10-CM | POA: Diagnosis not present

## 2019-09-04 DIAGNOSIS — I5032 Chronic diastolic (congestive) heart failure: Secondary | ICD-10-CM | POA: Insufficient documentation

## 2019-09-04 NOTE — Patient Instructions (Signed)
Medication Instructions:   OK to take an extra 40 mg of Lasix for 2 days if you have swelling or if your weight goes above 205 lbs.  *If you need a refill on your cardiac medications before your next appointment, please call your pharmacy*   Follow-Up: At [facility], you and your health needs are our priority.  As part of our continuing mission to provide you with exceptional heart care, we have created designated Provider Care Teams.  These Care Teams include your primary Cardiologist (physician) and Advanced Practice Providers (APPs -  Physician Assistants and Nurse Practitioners) who all work together to provide you with the care you need, when you need it.  We recommend signing up for the patient portal called "MyChart".  Sign up information is provided on this After Visit Summary.  MyChart is used to connect with patients for Virtual Visits (Telemedicine).  Patients are able to view lab/test results, encounter notes, upcoming appointments, etc.  Non-urgent messages can be sent to your provider as well.   To learn more about what you can do with MyChart, go to [email or web address].    Your next appointment:   3-4 month(s)  The format for your next appointment:   In Person  Provider:   Minus Breeding, MD   Other Instructions Our office will call you to schedule this appointment.

## 2019-09-04 NOTE — Progress Notes (Signed)
Virtual Visit via Telephone Note   This visit type was conducted due to national recommendations for restrictions regarding the COVID-19 Pandemic (e.g. social distancing) in an effort to limit this patient's exposure and mitigate transmission in our community.  Due to his co-morbid illnesses, this patient is at least at moderate risk for complications without adequate follow up.  This format is felt to be most appropriate for this patient at this time.  The patient did not have access to video technology/had technical difficulties with video requiring transitioning to audio format only (telephone).  All issues noted in this document were discussed and addressed.  No physical exam could be performed with this format.  Please refer to the patient's chart for his  consent to telehealth for [facility].   The patient was identified using 2 identifiers.  Date:  09/04/2019   ID:  [name], DOB 01-24-1941, MRN [id number]  Patient Location: Home Provider Location: Home  PCP:  [name], MD  Cardiologist:  Minus Breeding, MD  Electrophysiologist:  None   Evaluation Performed:  Follow-Up Visit  Chief Complaint:  none  History of Present Illness:    [name] is a 79 y.o. male with a hx ofcervical myelopathy. This was diagnosed in March 2020. He ultimately underwent surgery 09/05/2018 and has been recovering with physical therapy at home. He tells me he hasn't made much progress, basically wheel chair bound.   He was admitted to the hospital 01/17/2019 with acute respiratory failure felt to be a combination of community-acquired pneumonia and diastolic heart failure. The patient had not previously had any diastolic heart failure issues and has no history of coronary disease. He did have an incidental finding of coronary artery disease on a chest CT during that admission. Echocardiogram revealed an EF of 50 to 55%. Diastolic parameters were indeterminate. He did not have LVH.  He did have moderate left atrial enlargement. The patient diuresed 15 L according to hospital records. His weight after discharge was 198 lbs.   Since discharge he has had to take extra Lasix for a few doses secondary to increased weight and LE edema.  He called a week ago c/o of increasing LE edema and weight gain.  I suggested he increase his lasix to 40 mg BID x 3 days and he was contacted today for follow up.  Since he increased the Lasix his edema has resolved.  His weight is 203 lbs.  He denies any dyspnea.  The patient does not have symptoms concerning for COVID-19 infection (fever, chills, cough, or new shortness of breath).    Past Medical History:  Diagnosis Date  . Arthritis   . Barrett esophagus   . Bronchitis   . Carotid artery occlusion   . COPD (chronic obstructive pulmonary disease) ([place])   . Diabetes mellitus without complication ([place])   . GERD (gastroesophageal reflux disease)   . Headache    migraines  . History of thyroid cancer 2008  . Hypertension   . Restless leg   . Sleep apnea with use of continuous positive airway pressure (CPAP)    [facility] , AHI  77cn central and obstrcutive. 16 cm water , 3 cm EPR.   . Thoracic ascending aortic aneurysm (HCC)    4.2 cm ascending TAA 08/18/17 CTA. Annual imaging recommended.  [name] Ulcer    Peptic ulcer disease   Past Surgical History:  Procedure Laterality Date  . ANTERIOR CERVICAL DECOMP/DISCECTOMY FUSION N/A 09/03/2017   Procedure:  ACDF - C3-C4 - C4-C5 - C5-C6;  Surgeon: Kary Kos, MD;  Location: [facility];  Service: Neurosurgery;  Laterality: N/A;  . CAROTID ENDARTERECTOMY Left January 03, 2006   Dr. Amedeo Plenty  . COLONOSCOPY    . EYE SURGERY Bilateral    cataract surgery with lens implants  . game keepers thumb Right   . JOINT REPLACEMENT Left    knee X 2  . ROTATOR CUFF REPAIR Right   . THYROIDECTOMY  2008     Current Meds  Medication Sig  . acetaminophen (TYLENOL) 325 MG tablet Take 2 tablets (650 mg  total) by mouth every 4 (four) hours as needed for mild pain ((score 1 to 3) or temp > 100.5).  [name] allopurinol (ZYLOPRIM) 300 MG tablet Take 300 mg by mouth daily.   . baclofen (LIORESAL) 10 MG tablet TAKE 1 TABLET BY MOUTH 3 TIMES DAILY.  . calcium citrate-vitamin D (CITRACAL+D) 315-200 MG-UNIT per tablet Take 1 tablet by mouth daily. 750 mg   . carbidopa-levodopa (SINEMET IR) 10-100 MG tablet Take 1 tablet by mouth 2 (two) times daily.   . cholecalciferol (VITAMIN D) 1000 units tablet Take 1,000 Units by mouth daily.   . colchicine 0.6 MG tablet Take 0.6 mg by mouth daily.  [name] guaifenesin (HUMIBID E) 400 MG TABS tablet Take 400 mg by mouth daily.   [name] HYDROcodone-acetaminophen (NORCO/VICODIN) 5-325 MG tablet Take 1 tablet by mouth 3 (three) times daily as needed (pain). (Patient taking differently: Take 1 tablet by mouth daily as needed for moderate pain (pain). )  . ipratropium-albuterol (DUONEB) 0.5-2.5 (3) MG/3ML SOLN Take 3 mLs by nebulization every 6 (six) hours as needed (cough).  [name] levothyroxine (SYNTHROID) 175 MCG tablet Take 125 mcg by mouth daily before breakfast.   . metFORMIN (GLUCOPHAGE-XR) 500 MG 24 hr tablet Take 500 mg by mouth 2 (two) times daily.   . Multiple Vitamin (MULTIVITAMIN) tablet Take 1 tablet by mouth daily.  . nitroGLYCERIN (NITROSTAT) 0.4 MG SL tablet Place 0.4 mg every 5 (five) minutes as needed under the tongue for chest pain.  [name] oxymetazoline (AFRIN) 0.05 % nasal spray Place 2 sprays into the nose 2 (two) times daily as needed for congestion.   . pantoprazole (PROTONIX) 40 MG tablet Take 40 mg by mouth every evening.  . polyethylene glycol powder (MIRALAX) 17 GM/SCOOP powder Take by mouth as directed. 17 gm every other day  . rizatriptan (MAXALT) 10 MG tablet Take 10 mg by mouth as needed for migraine.   . simvastatin (ZOCOR) 40 MG tablet Take 40 mg by mouth daily at 6 PM.   . spironolactone (ALDACTONE) 25 MG tablet 25 mg daily.   . TRAVATAN Z 0.004 % SOLN  ophthalmic solution Place 1 drop into both eyes at bedtime.   . TRELEGY ELLIPTA 100-62.5-25 MCG/INH AEPB Inhale 1 puff into the lungs daily.   . VENTOLIN HFA 108 (90 Base) MCG/ACT inhaler Inhale 1-2 puffs into the lungs every 4 (four) hours as needed for wheezing or shortness of breath.      Allergies:   Patient has no known allergies.   Social History   Tobacco Use  . Smoking status: Former Smoker    Years: 40.00    Types: Cigars, Cigarettes    Quit date: 06/19/2005    Years since quitting: 14.2  . Smokeless tobacco: Never Used  . Tobacco comment: smoked 4-6 cigars daily, only smoked cigarettes socially  Substance Use Topics  . Alcohol use: No  .  Drug use: No     Family Hx: The patient's family history includes COPD in his mother; Cancer in his father; Diabetes in his father; Hyperlipidemia in his father and mother; Hypertension in his father and mother; Kidney disease in his father.  ROS:   Please see the history of present illness.    All other systems reviewed and are negative.   Prior CV studies:   The following studies were reviewed today: Echo 01/19/2019  Labs/Other Tests and Data Reviewed:    EKG:  An ECG dated 01/17/2019 was personally reviewed today and demonstrated:  NSR, HR 93, RBBB, LAFB  Recent Labs: 01/24/2019: ALT 21 01/28/2019: Magnesium 2.7 02/14/2019: Hemoglobin 12.5; NT-Pro BNP 482; Platelets 190 04/07/2019: BUN 22; Creatinine, Ser 1.04; Potassium 4.7; Sodium 146   Recent Lipid Panel No results found for: CHOL, TRIG, HDL, CHOLHDL, LDLCALC, LDLDIRECT  Wt Readings from Last 3 Encounters:  09/04/19 203 lb (92.1 kg)  06/25/19 198 lb (89.8 kg)  04/07/19 204 lb 3.2 oz (92.6 kg)     Objective:    Vital Signs:  BP 119/73   Ht 5\' 7"  (1.702 m)   Wt 203 lb (92.1 kg)   SpO2 92%   BMI 31.79 kg/m    VITAL SIGNS:  reviewed  ASSESSMENT & PLAN:    Acute on chronic diastolic CHF- Pt had weight gain and LE edema which responded to a few days of BID Lasix.   It appears his baseline weight is around 200 lbs.   History of respiratory failure with hypoxemia  Admitted 01/16/2019- 01/28/2019 with combined COPD and diastolic CHF. Echo showed EF of 50-55%. 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 He is in a wheel chair.   Community acquired pneumonia of right lower lobe of lung ([place]) Hospitalized 7/31-8/04/2019  OSA on CPAP C-pap and O2 at night  PVD (peripheral vascular disease) (HCC) Lt ABI - 0.62, Rt 0.89. No claudication symptoms  Plan:  I told him he could increase his Lasix to 40 mg BID for two days if he has edema and his weight goes > 205 lbs.    COVID-19 Education: He has received his vaccine The signs and symptoms of COVID-19 were discussed with the patient and how to seek care for testing (follow up with PCP or arrange E-visit).  The importance of social distancing was discussed today.  Time:   Today, I have spent 10 minutes with the patient with telehealth technology discussing the above problems.     Medication Adjustments/Labs and Tests Ordered: Current medicines are reviewed at length with the patient today.  Concerns regarding medicines are outlined above.   Tests Ordered: No orders of the defined types were placed in this encounter.   Medication Changes: No orders of the defined types were placed in this encounter.   Follow Up:  In Person Dr Percival Spanish 3-4 months  Signed, Kerin Ransom, PA-C  09/04/2019 9:34 AM    [place]

## 2019-09-08 ENCOUNTER — Telehealth: Payer: Self-pay | Admitting: Adult Health

## 2019-09-08 MED ORDER — GABAPENTIN 300 MG PO CAPS: 300.0000 mg | ORAL_CAPSULE | Freq: Every day | ORAL | 11 refills | Status: DC

## 2019-09-08 NOTE — Telephone Encounter (Signed)
Pt called and LVM on Friday afternoon stating that the medication that was prescribed to him is not working and he was not able to get much sleep last night due to the spasms he is having in his legs. Pt states they are twitching and jerking and he is needing relief. Please advise.

## 2019-09-08 NOTE — Telephone Encounter (Signed)
I called pt back.  MM/NP out this afternoon.  He states that the baclofen 10mg  po TID and 5 mg po qhs is not working.  He tried keppra and this did not work.  (looks like was on requip(ropinrole) in past.  This he stated did not help either.  Please advise.

## 2019-09-08 NOTE — Telephone Encounter (Signed)
Let's add Gabapentin at 8 PM- 300 mg. If this helps, I can titrate up further. CD

## 2019-09-08 NOTE — Telephone Encounter (Signed)
I called pt and relayed that gabapentin 300mg  po at 2000, order placed to [facility].

## 2019-09-10 ENCOUNTER — Ambulatory Visit: Payer: Medicare Other

## 2019-09-10 ENCOUNTER — Other Ambulatory Visit: Payer: Self-pay

## 2019-09-10 DIAGNOSIS — R2689 Other abnormalities of gait and mobility: Secondary | ICD-10-CM

## 2019-09-10 DIAGNOSIS — R293 Abnormal posture: Secondary | ICD-10-CM

## 2019-09-10 DIAGNOSIS — M6281 Muscle weakness (generalized): Secondary | ICD-10-CM

## 2019-09-10 NOTE — Therapy (Signed)
[facility] [address] [place], [place], [address] Phone: [phone]   Fax:  [phone]  Physical Therapy Treatment  Patient Details  Name: [name] MRN: [id number] Date of Birth: 07-Apr-1941 Referring Provider (PT): [name]   Encounter Date: 09/10/2019  PT End of Session - 09/10/19 1452    Visit Number  15    Number of Visits  19    Date for PT Re-Evaluation  10/20/19    Authorization Type  [employer] medicare 10th visit progress note    PT Start Time  1446    PT Stop Time  1533   7 min unskilled   PT Time Calculation (min)  47 min    Equipment Utilized During Treatment  Gait belt    Activity Tolerance  Patient tolerated treatment well    Behavior During Therapy  [facility] for tasks assessed/performed       Past Medical History:  Diagnosis Date  . Arthritis   . Barrett esophagus   . Bronchitis   . Carotid artery occlusion   . COPD (chronic obstructive pulmonary disease) ([place])   . Diabetes mellitus without complication ([place])   . GERD (gastroesophageal reflux disease)   . Headache    migraines  . History of thyroid cancer 2008  . Hypertension   . Restless leg   . Sleep apnea with use of continuous positive airway pressure (CPAP)    [facility] , [facility]HI  77cn central and obstrcutive. 16 cm water , 3 cm EPR.   . Thoracic ascending aortic aneurysm (HCC)    4.2 cm ascending TAA 08/18/17 CTA. Annual imaging recommended.  [name] Ulcer    Peptic ulcer disease    Past Surgical History:  Procedure Laterality Date  . ANTERIOR CERVICAL DECOMP/DISCECTOMY FUSION N/A 09/03/2017   Procedure: ACDF - C3-C4 - C4-C5 - C5-C6;  Surgeon: Kary Kos, MD;  Location: [facility];  Service: Neurosurgery;  Laterality: N/A;  . CAROTID ENDARTERECTOMY Left January 03, 2006   Dr. Amedeo Plenty  . COLONOSCOPY    . EYE SURGERY Bilateral    cataract surgery with lens implants  . game keepers thumb Right   . JOINT REPLACEMENT Left    knee X 2  .  ROTATOR CUFF REPAIR Right   . THYROIDECTOMY  2008    There were no vitals filed for this visit.  Subjective Assessment - 09/10/19 1450    Subjective  Pt reports that knees are not as good today. Wife states they started him on something for yeast infection to see if that helps as white blood cell count running higher. Pt has not been doing any walking at home.    Pertinent History  PMH:  79 y.o. male with past medical history significant for coronary artery disease, COPD not on home oxygen, diabetes, restless leg syndrome, OSA on CPAP, CHF. Anterior cervical decomp/disectomy 08/2017    Patient Stated Goals  Pt wants to walk.    Currently in Pain?  Yes    Pain Score  0-No pain    Pain Location  Neck    Pain Descriptors / Indicators  Sore    Pain Type  Chronic pain                       [facility] Adult PT Treatment/Exercise - 09/10/19 1452      Transfers   Transfers  Sit to Stand;Stand to [employer] Transfers    Sit to Stand  4: Min guard    Sit to Stand Details  Verbal cues for technique    Sit to Stand Details (indicate cue type and reason)  Pt performed x 2 from transport chair pushing from chair. He had 1 episode of left knee drawing up in standing and sitting suddenly.     Stand to Sit  4: Min guard    Comments  Sit to stand x 4 with focus on improved standing tolerance x approx 1 min each the last 2 reps. Verbal cues for upright posture throughout. Tactile cues through left knee to prevent buckling as needed.      Exercises   Exercises  Other Exercises    Other Exercises   Pt performed side stepping along mat 4' x 2 with increased difficulty going right and poor foot clearance bilateral. PT blocking at left knee throughout.      Knee/Hip Exercises: Aerobic   Other Aerobic  Sci Fit level 4 x 7 min. Pt needed assist to position feet on pedals.       Knee/Hip Exercises: Seated   Long Arc Quad  Strengthening;Right;1 set;10 reps    Marching  Strengthening;Both;1  set;10 reps             PT Education - 09/10/19 1820    Education Details  Education again on importance of trying to do more at home with exercises and standing/walking on good days.    Person(s) Educated  Patient    Methods  Explanation    Comprehension  Verbalized understanding       PT Short Term Goals - 09/10/19 1821      PT SHORT TERM GOAL #1   Title  Pt will be instructed in HEP for strengthening and balance activities to progress functional mobility on own safely with caregiver assist.    Baseline  05/09/19: met with current program. Adding hamstring stretch and standing at sink.    Status  Achieved    Target Date  05/11/19      PT SHORT TERM GOAL #2   Title  Pt will be able to perform sit to stand transfers from varied surfaces supervision for improved mobility.    Baseline  Pt's sit to stand transfers still vary from supervision to CGA    Time  4    Period  Weeks    Status  Not Met    Target Date  09/10/19   4 weeks from next visit due to delay in schedule     PT SHORT TERM GOAL #3   Title  Pt will ambulate 68' with FWW CGA for improved mobility in home.    Baseline  Pt had met this goal last visit with 115' CGA but has not been able to replicate this with varying distances from 0 to 68' CGA/min assist.    Time  4    Period  Weeks    Status  Partially Met    Target Date  09/10/19        PT Long Term Goals - 08/11/19 1411      PT LONG TERM GOAL #1   Title  Pt will increase 30 sec sit to stand from 4 reps edge of mat to 6 or more for improved functional strength and mobility.    Baseline  4 reps on 08/13/19    Time  8    Period  Weeks    Status  On-going    Target Date  10/10/19  PT LONG TERM GOAL #2   Title  Pt will be able to ambulate >100' with FWW supervision for improved household distances.    Baseline  Pt's gait distance varied from 30' to 34' with RW on 08/13/19 CGA    Time  8    Period  Weeks    Status  On-going    Target Date  10/10/19       PT LONG TERM GOAL #3   Title  Pt will be able to maintain standing x 5 min with minimal UE support for improved balance to assist with ADLs.    Baseline  3 min 4 sec at walker on 08/13/19    Time  8    Period  Weeks    Status  On-going    Target Date  10/10/19      PT LONG TERM GOAL #4   Title  Pt will be able to perform progressive HEP for strengthening, balance, flexibility and walking at home with family/caregiver assist.    Time  8    Period  Weeks    Status  New    Target Date  10/10/19      PT LONG TERM GOAL #5   Title  Pt will ambulate up/down ramp min assist for better access to home with RW.    Time  8    Period  Weeks    Status  New    Target Date  10/10/19            Plan - 09/10/19 1823    Clinical Impression Statement  Pt was limited by pain/weakness/ spasms in left LE today making it difficult to put weight through left leg safely. Unable to ambulate other than to transfer stand/pivot with walker at visit. Carryover at home also seems to be limiting factor.    Personal Factors and Comorbidities  Comorbidity 3+;Age    Examination-Activity Limitations  Bathing;Locomotion Level;Transfers;Stand    Examination-Participation Restrictions  Community Activity    Stability/Clinical Decision Making  Evolving/Moderate complexity    Rehab Potential  Good    PT Frequency  1x / week    PT Duration  8 weeks    PT Treatment/Interventions  ADLs/Self Care Home Management;Cryotherapy;DME Instruction;Moist Heat;Gait training;Stair training;Functional mobility training;Therapeutic activities;Therapeutic exercise;Balance training;Patient/family education;Neuromuscular re-education;Manual techniques;Passive range of motion    PT Next Visit Plan  Continue with transfer training, strengthening/stretching to help with more upright posture, standing balance. Gait with RW and chair follow.    PT Home Exercise Plan  [email or web address]    Consulted and Agree with Plan of Care  Patient;Family  member/caregiver    Family Member Consulted  wife       Patient will benefit from skilled therapeutic intervention in order to improve the following deficits and impairments:  Abnormal gait, Cardiopulmonary status limiting activity, Decreased activity tolerance, Decreased balance, Difficulty walking, Increased edema, Impaired sensation, Impaired flexibility, Impaired UE functional use, Postural dysfunction, Pain  Visit Diagnosis: Other abnormalities of gait and mobility  Muscle weakness (generalized)  Abnormal posture     Problem List Patient Active Problem List   Diagnosis Date Noted  . Chronic diastolic CHF (congestive heart failure) ([place]) 09/04/2019  . PVD (peripheral vascular disease) ([place]) 02/14/2019  . Essential hypertension 02/13/2019  . Non-insulin treated type 2 diabetes mellitus ([place]) 02/13/2019  . Severe sepsis ([place]) 01/17/2019  . Community acquired pneumonia of right lower lobe of lung 01/17/2019  . Postural urinary incontinence 05/20/2018  . Myelopathy concurrent with and due  to spinal stenosis of cervical region ([place]) 05/20/2018  . Myoclonic jerking 05/20/2018  . 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 History of respiratory failure 08/18/2017  . Hypokalemia 08/18/2017  . Leukocytosis 08/18/2017  . Muscle atrophy of lower extremity 08/09/2017  . Bowel and bladder incontinence 08/09/2017  . Right-sided muscle weakness 08/09/2017  . Neuropathy 08/09/2017  . 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/2017  . Abnormality of gait 07/12/2017  . Myoclonic jerkings, 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/2017  . Acquired right foot drop 07/12/2017  . UTI (urinary tract infection) due to Enterococcus 07/12/2017  . Pressure injury of skin 06/21/2017  . Acute cystitis 06/20/2017  . Generalized weakness 06/20/2017  . Dehydration 06/20/2017  . Dyspnea 12/26/2016  . Chronic cough 12/26/2016  . Hypersomnia with sleep apnea 10/30/2016  . CSA  (central sleep apnea) 10/30/2016  . Wheezing symptom 10/30/2016  . 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 RLS (restless legs syndrome) 08/25/2014  . Primary gout 08/25/2014  . OSA on CPAP 08/25/2014  . Severe obesity (BMI >= 40) ([place]) 08/25/2014  . Obesity (BMI 30-39.9) 02/18/2013  . 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 S/P TKR (total knee replacement) 11/29/2012  . History of thyroid cancer     Electa Sniff, PT, DPT, NCS 09/10/2019, 6:25 PM  [facility] [address] [place], [place], [address] Phone: [phone]   Fax:  [phone]  Name: JAYTON POPELKA MRN: [id number] Date of Birth: Nov 05, 1940

## 2019-09-11 ENCOUNTER — Telehealth: Payer: Self-pay | Admitting: Cardiology

## 2019-09-11 NOTE — Telephone Encounter (Signed)
Spoke with pt and wife who report pt has been experiencing low BP for the past couple of weeks on several occassions. BP readings listed below.  3/15- 83/51 3/16- 94/62 3/21- 92/64 3/23- 84/58 3/25- 85/43, 89/50, 99/65  Wife report all other days BP was greater than 100's and HR averaging in the 70"s. Pt is asymptomatic but will forward to MD for review.

## 2019-09-11 NOTE — Telephone Encounter (Signed)
Pt c/o BP issue: STAT if pt c/o blurred vision, one-sided weakness or slurred speech  1. What are your last 5 BP readings? All taken today 96/60 85/43 (x3) 89/50    2. Are you having any other symptoms (ex. Dizziness, headache, blurred vision, passed out)?  No. Pt denies sx   3. What is your BP issue? [employer] nurse is with the pt. She called to report a low BP for the patient.  The [facility] nurse said this is not the first day that his BP has dropped like this. At one point in the last 14 days his BP went as low as 74/53

## 2019-09-12 ENCOUNTER — Telehealth: Payer: Self-pay | Admitting: Adult Health

## 2019-09-12 NOTE — Telephone Encounter (Signed)
Sharyn Lull, RN Case Manager with [employer] states she is calling to follow up with Dr. Rosezella [place] nurse in regards to the patient's condition. Sharyn Lull states that per the patient, he has not received a phone call back from the office in regards to his BP. I made Sharyn Lull aware that Lars Mage returned the call to the patient's wife and has forwarded readings to Dr. Percival Spanish for review. Sharyn Lull verbalized understanding, stating she does need a call back. However, if our office has any questions for her she can be reached at [phone] (ext: [phone]).

## 2019-09-12 NOTE — Telephone Encounter (Signed)
Pt called stating that the gabapentin (NEURONTIN) 300 MG capsule is not working for him and he is going to stop taking it and would like to know if there is something else that can be given to him. Please advise.

## 2019-09-12 NOTE — Telephone Encounter (Signed)
I spoke with him this evening.  His BP is in the 90s and he feels fine.  His readings seem to be up from those reported.  No change in therapy.  He will let us know if he has light headedness.

## 2019-09-15 NOTE — Telephone Encounter (Signed)
I spoke to pt and gabapentin 300mg  po 2000, made him really groggy (waking at 1000 next next).  Last night he took baclofen 10mg  po BId then 20mg  po qhs. (better but woke 1230).  Please advise.  I relayed Dr. Brett Fairy out of office this week.

## 2019-09-15 NOTE — Telephone Encounter (Signed)
I can lower her gabapentin to 100 mg at bedtime to see if he tolerates this better?

## 2019-09-16 MED ORDER — GABAPENTIN 100 MG PO CAPS: 100.0000 mg | ORAL_CAPSULE | Freq: Every day | ORAL | 1 refills | Status: DC

## 2019-09-16 NOTE — Telephone Encounter (Signed)
I called pt and relayed that per MM/NP to try taking 100mg  po qhs to see if this may help.  Pt was agreeable to this.  Will call into local phamacy 30 days supply. Offered sooner appt and he deferred to stay on 10-29-19 at this time.  [facility] was sent order for gabapentin 100mg  caps.

## 2019-09-17 ENCOUNTER — Inpatient Hospital Stay (HOSPITAL_COMMUNITY): Payer: Medicare Other

## 2019-09-17 ENCOUNTER — Emergency Department (HOSPITAL_COMMUNITY): Payer: Medicare Other

## 2019-09-17 ENCOUNTER — Inpatient Hospital Stay (HOSPITAL_COMMUNITY)
Admission: EM | Admit: 2019-09-17 | Discharge: 2019-09-26 | DRG: 057 | Disposition: A | Discharge status: Rehabilitation Facility | Payer: Medicare Other | Attending: Internal Medicine | Admitting: Internal Medicine

## 2019-09-17 ENCOUNTER — Other Ambulatory Visit: Payer: Self-pay

## 2019-09-17 ENCOUNTER — Encounter (HOSPITAL_COMMUNITY): Payer: Self-pay | Admitting: Radiology

## 2019-09-17 DIAGNOSIS — E1151 Type 2 diabetes mellitus with diabetic peripheral angiopathy without gangrene: Secondary | ICD-10-CM | POA: Diagnosis present

## 2019-09-17 DIAGNOSIS — Z8711 Personal history of peptic ulcer disease: Secondary | ICD-10-CM

## 2019-09-17 DIAGNOSIS — I110 Hypertensive heart disease with heart failure: Secondary | ICD-10-CM | POA: Diagnosis present

## 2019-09-17 DIAGNOSIS — G4731 Primary central sleep apnea: Secondary | ICD-10-CM | POA: Diagnosis present

## 2019-09-17 DIAGNOSIS — J440 Chronic obstructive pulmonary disease with acute lower respiratory infection: Secondary | ICD-10-CM | POA: Diagnosis not present

## 2019-09-17 DIAGNOSIS — M4712 Other spondylosis with myelopathy, cervical region: Secondary | ICD-10-CM | POA: Diagnosis not present

## 2019-09-17 DIAGNOSIS — G2581 Restless legs syndrome: Secondary | ICD-10-CM | POA: Diagnosis present

## 2019-09-17 DIAGNOSIS — R55 Syncope and collapse: Secondary | ICD-10-CM | POA: Diagnosis present

## 2019-09-17 DIAGNOSIS — Z85850 Personal history of malignant neoplasm of thyroid: Secondary | ICD-10-CM | POA: Diagnosis not present

## 2019-09-17 DIAGNOSIS — I712 Thoracic aortic aneurysm, without rupture, unspecified: Secondary | ICD-10-CM | POA: Diagnosis present

## 2019-09-17 DIAGNOSIS — E118 Type 2 diabetes mellitus with unspecified complications: Secondary | ICD-10-CM | POA: Diagnosis not present

## 2019-09-17 DIAGNOSIS — G992 Myelopathy in diseases classified elsewhere: Secondary | ICD-10-CM

## 2019-09-17 DIAGNOSIS — R159 Full incontinence of feces: Secondary | ICD-10-CM | POA: Diagnosis not present

## 2019-09-17 DIAGNOSIS — M48061 Spinal stenosis, lumbar region without neurogenic claudication: Secondary | ICD-10-CM | POA: Diagnosis not present

## 2019-09-17 DIAGNOSIS — Z87440 Personal history of urinary (tract) infections: Secondary | ICD-10-CM

## 2019-09-17 DIAGNOSIS — Z833 Family history of diabetes mellitus: Secondary | ICD-10-CM

## 2019-09-17 DIAGNOSIS — J9811 Atelectasis: Secondary | ICD-10-CM | POA: Diagnosis not present

## 2019-09-17 DIAGNOSIS — K2270 Barrett's esophagus without dysplasia: Secondary | ICD-10-CM | POA: Diagnosis present

## 2019-09-17 DIAGNOSIS — K219 Gastro-esophageal reflux disease without esophagitis: Secondary | ICD-10-CM | POA: Diagnosis present

## 2019-09-17 DIAGNOSIS — R201 Hypoesthesia of skin: Secondary | ICD-10-CM | POA: Diagnosis present

## 2019-09-17 DIAGNOSIS — Z20822 Contact with and (suspected) exposure to covid-19: Secondary | ICD-10-CM | POA: Diagnosis present

## 2019-09-17 DIAGNOSIS — M109 Gout, unspecified: Secondary | ICD-10-CM | POA: Diagnosis present

## 2019-09-17 DIAGNOSIS — R32 Unspecified urinary incontinence: Secondary | ICD-10-CM | POA: Diagnosis present

## 2019-09-17 DIAGNOSIS — M7989 Other specified soft tissue disorders: Secondary | ICD-10-CM | POA: Diagnosis not present

## 2019-09-17 DIAGNOSIS — R531 Weakness: Secondary | ICD-10-CM

## 2019-09-17 DIAGNOSIS — I739 Peripheral vascular disease, unspecified: Secondary | ICD-10-CM | POA: Diagnosis present

## 2019-09-17 DIAGNOSIS — G4733 Obstructive sleep apnea (adult) (pediatric): Secondary | ICD-10-CM | POA: Diagnosis present

## 2019-09-17 DIAGNOSIS — I10 Essential (primary) hypertension: Secondary | ICD-10-CM | POA: Diagnosis not present

## 2019-09-17 DIAGNOSIS — M62838 Other muscle spasm: Secondary | ICD-10-CM | POA: Diagnosis not present

## 2019-09-17 DIAGNOSIS — M5137 Other intervertebral disc degeneration, lumbosacral region: Secondary | ICD-10-CM | POA: Diagnosis present

## 2019-09-17 DIAGNOSIS — J449 Chronic obstructive pulmonary disease, unspecified: Secondary | ICD-10-CM | POA: Diagnosis present

## 2019-09-17 DIAGNOSIS — J189 Pneumonia, unspecified organism: Secondary | ICD-10-CM | POA: Diagnosis not present

## 2019-09-17 DIAGNOSIS — L89523 Pressure ulcer of left ankle, stage 3: Secondary | ICD-10-CM | POA: Diagnosis not present

## 2019-09-17 DIAGNOSIS — G253 Myoclonus: Secondary | ICD-10-CM | POA: Diagnosis present

## 2019-09-17 DIAGNOSIS — Z825 Family history of asthma and other chronic lower respiratory diseases: Secondary | ICD-10-CM

## 2019-09-17 DIAGNOSIS — G629 Polyneuropathy, unspecified: Secondary | ICD-10-CM

## 2019-09-17 DIAGNOSIS — N179 Acute kidney failure, unspecified: Secondary | ICD-10-CM | POA: Diagnosis present

## 2019-09-17 DIAGNOSIS — E669 Obesity, unspecified: Secondary | ICD-10-CM | POA: Diagnosis present

## 2019-09-17 DIAGNOSIS — M5412 Radiculopathy, cervical region: Secondary | ICD-10-CM | POA: Diagnosis present

## 2019-09-17 DIAGNOSIS — E860 Dehydration: Secondary | ICD-10-CM | POA: Diagnosis present

## 2019-09-17 DIAGNOSIS — M5134 Other intervertebral disc degeneration, thoracic region: Secondary | ICD-10-CM | POA: Diagnosis present

## 2019-09-17 DIAGNOSIS — Z66 Do not resuscitate: Secondary | ICD-10-CM | POA: Diagnosis not present

## 2019-09-17 DIAGNOSIS — N319 Neuromuscular dysfunction of bladder, unspecified: Secondary | ICD-10-CM | POA: Diagnosis not present

## 2019-09-17 DIAGNOSIS — G8929 Other chronic pain: Secondary | ICD-10-CM | POA: Diagnosis present

## 2019-09-17 DIAGNOSIS — G959 Disease of spinal cord, unspecified: Secondary | ICD-10-CM | POA: Diagnosis not present

## 2019-09-17 DIAGNOSIS — G8194 Hemiplegia, unspecified affecting left nondominant side: Secondary | ICD-10-CM | POA: Diagnosis not present

## 2019-09-17 DIAGNOSIS — Z79899 Other long term (current) drug therapy: Secondary | ICD-10-CM

## 2019-09-17 DIAGNOSIS — Z9989 Dependence on other enabling machines and devices: Secondary | ICD-10-CM

## 2019-09-17 DIAGNOSIS — J9611 Chronic respiratory failure with hypoxia: Secondary | ICD-10-CM | POA: Diagnosis not present

## 2019-09-17 DIAGNOSIS — E890 Postprocedural hypothyroidism: Secondary | ICD-10-CM | POA: Diagnosis present

## 2019-09-17 DIAGNOSIS — I152 Hypertension secondary to endocrine disorders: Secondary | ICD-10-CM | POA: Diagnosis present

## 2019-09-17 DIAGNOSIS — R0902 Hypoxemia: Secondary | ICD-10-CM | POA: Diagnosis not present

## 2019-09-17 DIAGNOSIS — Z8249 Family history of ischemic heart disease and other diseases of the circulatory system: Secondary | ICD-10-CM

## 2019-09-17 DIAGNOSIS — G8222 Paraplegia, incomplete: Secondary | ICD-10-CM | POA: Diagnosis not present

## 2019-09-17 DIAGNOSIS — G8191 Hemiplegia, unspecified affecting right dominant side: Secondary | ICD-10-CM | POA: Insufficient documentation

## 2019-09-17 DIAGNOSIS — R3981 Functional urinary incontinence: Secondary | ICD-10-CM | POA: Diagnosis not present

## 2019-09-17 DIAGNOSIS — E1140 Type 2 diabetes mellitus with diabetic neuropathy, unspecified: Secondary | ICD-10-CM | POA: Diagnosis present

## 2019-09-17 DIAGNOSIS — M21371 Foot drop, right foot: Secondary | ICD-10-CM | POA: Diagnosis present

## 2019-09-17 DIAGNOSIS — M4802 Spinal stenosis, cervical region: Secondary | ICD-10-CM | POA: Diagnosis present

## 2019-09-17 DIAGNOSIS — R252 Cramp and spasm: Secondary | ICD-10-CM | POA: Diagnosis not present

## 2019-09-17 DIAGNOSIS — Z9981 Dependence on supplemental oxygen: Secondary | ICD-10-CM | POA: Diagnosis not present

## 2019-09-17 DIAGNOSIS — Z79890 Hormone replacement therapy (postmenopausal): Secondary | ICD-10-CM

## 2019-09-17 DIAGNOSIS — M48062 Spinal stenosis, lumbar region with neurogenic claudication: Secondary | ICD-10-CM | POA: Diagnosis not present

## 2019-09-17 DIAGNOSIS — Z87891 Personal history of nicotine dependence: Secondary | ICD-10-CM

## 2019-09-17 DIAGNOSIS — I5032 Chronic diastolic (congestive) heart failure: Secondary | ICD-10-CM | POA: Diagnosis not present

## 2019-09-17 DIAGNOSIS — R0989 Other specified symptoms and signs involving the circulatory and respiratory systems: Secondary | ICD-10-CM | POA: Diagnosis not present

## 2019-09-17 DIAGNOSIS — Z6832 Body mass index (BMI) 32.0-32.9, adult: Secondary | ICD-10-CM

## 2019-09-17 DIAGNOSIS — M5136 Other intervertebral disc degeneration, lumbar region: Secondary | ICD-10-CM | POA: Diagnosis present

## 2019-09-17 DIAGNOSIS — Z96652 Presence of left artificial knee joint: Secondary | ICD-10-CM | POA: Diagnosis present

## 2019-09-17 DIAGNOSIS — K592 Neurogenic bowel, not elsewhere classified: Secondary | ICD-10-CM | POA: Diagnosis not present

## 2019-09-17 DIAGNOSIS — Z981 Arthrodesis status: Secondary | ICD-10-CM

## 2019-09-17 DIAGNOSIS — M47816 Spondylosis without myelopathy or radiculopathy, lumbar region: Secondary | ICD-10-CM | POA: Diagnosis present

## 2019-09-17 DIAGNOSIS — J9601 Acute respiratory failure with hypoxia: Secondary | ICD-10-CM | POA: Diagnosis not present

## 2019-09-17 DIAGNOSIS — I351 Nonrheumatic aortic (valve) insufficiency: Secondary | ICD-10-CM | POA: Diagnosis not present

## 2019-09-17 DIAGNOSIS — R2689 Other abnormalities of gait and mobility: Secondary | ICD-10-CM | POA: Diagnosis not present

## 2019-09-17 DIAGNOSIS — M4714 Other spondylosis with myelopathy, thoracic region: Secondary | ICD-10-CM | POA: Diagnosis not present

## 2019-09-17 LAB — COMPREHENSIVE METABOLIC PANEL
ALT: 10 U/L (ref 0–44)
AST: 21 U/L (ref 15–41)
Albumin: 3.9 g/dL (ref 3.5–5.0)
Alkaline Phosphatase: 61 U/L (ref 38–126)
Anion gap: 10 (ref 5–15)
BUN: 43 mg/dL — ABNORMAL HIGH (ref 8–23)
CO2: 27 mmol/L (ref 22–32)
Calcium: 9.3 mg/dL (ref 8.9–10.3)
Chloride: 101 mmol/L (ref 98–111)
Creatinine, Ser: 1.64 mg/dL — ABNORMAL HIGH (ref 0.61–1.24)
GFR calc Af Amer: 46 mL/min — ABNORMAL LOW (ref >60)
GFR calc non Af Amer: 39 mL/min — ABNORMAL LOW (ref >60)
Glucose, Bld: 109 mg/dL — ABNORMAL HIGH (ref 70–99)
Potassium: 4.8 mmol/L (ref 3.5–5.1)
Sodium: 138 mmol/L (ref 135–145)
Total Bilirubin: 0.6 mg/dL (ref 0.3–1.2)
Total Protein: 7 g/dL (ref 6.5–8.1)

## 2019-09-17 LAB — CBC WITH DIFFERENTIAL/PLATELET
Abs Immature Granulocytes: 0.03 10*3/uL (ref 0.00–0.07)
Basophils Absolute: 0.1 10*3/uL (ref 0.0–0.1)
Basophils Relative: 1 %
Eosinophils Absolute: 0.3 10*3/uL (ref 0.0–0.5)
Eosinophils Relative: 2 %
HCT: 40.5 % (ref 39.0–52.0)
Hemoglobin: 13.3 g/dL (ref 13.0–17.0)
Immature Granulocytes: 0 %
Lymphocytes Relative: 10 %
Lymphs Abs: 1.3 10*3/uL (ref 0.7–4.0)
MCH: 30 pg (ref 26.0–34.0)
MCHC: 32.8 g/dL (ref 30.0–36.0)
MCV: 91.2 fL (ref 80.0–100.0)
Monocytes Absolute: 1.2 10*3/uL — ABNORMAL HIGH (ref 0.1–1.0)
Monocytes Relative: 9 %
Neutro Abs: 10.3 10*3/uL — ABNORMAL HIGH (ref 1.7–7.7)
Neutrophils Relative %: 78 %
Platelets: 214 10*3/uL (ref 150–400)
RBC: 4.44 MIL/uL (ref 4.22–5.81)
RDW: 14.3 % (ref 11.5–15.5)
WBC: 13.1 10*3/uL — ABNORMAL HIGH (ref 4.0–10.5)
nRBC: 0 % (ref 0.0–0.2)

## 2019-09-17 LAB — URINALYSIS, ROUTINE W REFLEX MICROSCOPIC
Bacteria, UA: NONE SEEN
Bilirubin Urine: NEGATIVE
Glucose, UA: NEGATIVE mg/dL
Hgb urine dipstick: NEGATIVE
Ketones, ur: NEGATIVE mg/dL
Nitrite: NEGATIVE
Protein, ur: NEGATIVE mg/dL
Specific Gravity, Urine: 1.005 (ref 1.005–1.030)
WBC, UA: >50 WBC/hpf — ABNORMAL HIGH (ref 0–5)
pH: 6 (ref 5.0–8.0)

## 2019-09-17 LAB — SEDIMENTATION RATE: Sed Rate: 8 mm/hr (ref 0–16)

## 2019-09-17 LAB — CREATININE, SERUM
Creatinine, Ser: 1.26 mg/dL — ABNORMAL HIGH (ref 0.61–1.24)
GFR calc Af Amer: >60 mL/min (ref >60)
GFR calc non Af Amer: 54 mL/min — ABNORMAL LOW (ref >60)

## 2019-09-17 LAB — C-REACTIVE PROTEIN: CRP: 1.7 mg/dL — ABNORMAL HIGH (ref <1.0)

## 2019-09-17 LAB — CBC
HCT: 38.2 % — ABNORMAL LOW (ref 39.0–52.0)
Hemoglobin: 12.6 g/dL — ABNORMAL LOW (ref 13.0–17.0)
MCH: 30.1 pg (ref 26.0–34.0)
MCHC: 33 g/dL (ref 30.0–36.0)
MCV: 91.4 fL (ref 80.0–100.0)
Platelets: 211 10*3/uL (ref 150–400)
RBC: 4.18 MIL/uL — ABNORMAL LOW (ref 4.22–5.81)
RDW: 14.5 % (ref 11.5–15.5)
WBC: 9.8 10*3/uL (ref 4.0–10.5)
nRBC: 0 % (ref 0.0–0.2)

## 2019-09-17 LAB — T4, FREE: Free T4: 1.23 ng/dL — ABNORMAL HIGH (ref 0.61–1.12)

## 2019-09-17 LAB — TROPONIN I (HIGH SENSITIVITY): Troponin I (High Sensitivity): 7 ng/L (ref <18)

## 2019-09-17 LAB — TSH
TSH: 0.28 u[IU]/mL — ABNORMAL LOW (ref 0.350–4.500)
TSH: 0.223 u[IU]/mL — ABNORMAL LOW (ref 0.350–4.500)

## 2019-09-17 LAB — D-DIMER, QUANTITATIVE: D-Dimer, Quant: 0.47 ug/mL-FEU (ref 0.00–0.50)

## 2019-09-17 LAB — CBG MONITORING, ED: Glucose-Capillary: 109 mg/dL — ABNORMAL HIGH (ref 70–99)

## 2019-09-17 IMAGING — DX DG CHEST 1V PORT
1 series · 1 of 1 positions shown · non-contrast
Comparison: CT [DATE]

CLINICAL DATA: Generalized weakness.

EXAM:
PORTABLE CHEST 1 VIEW

[chest ap]
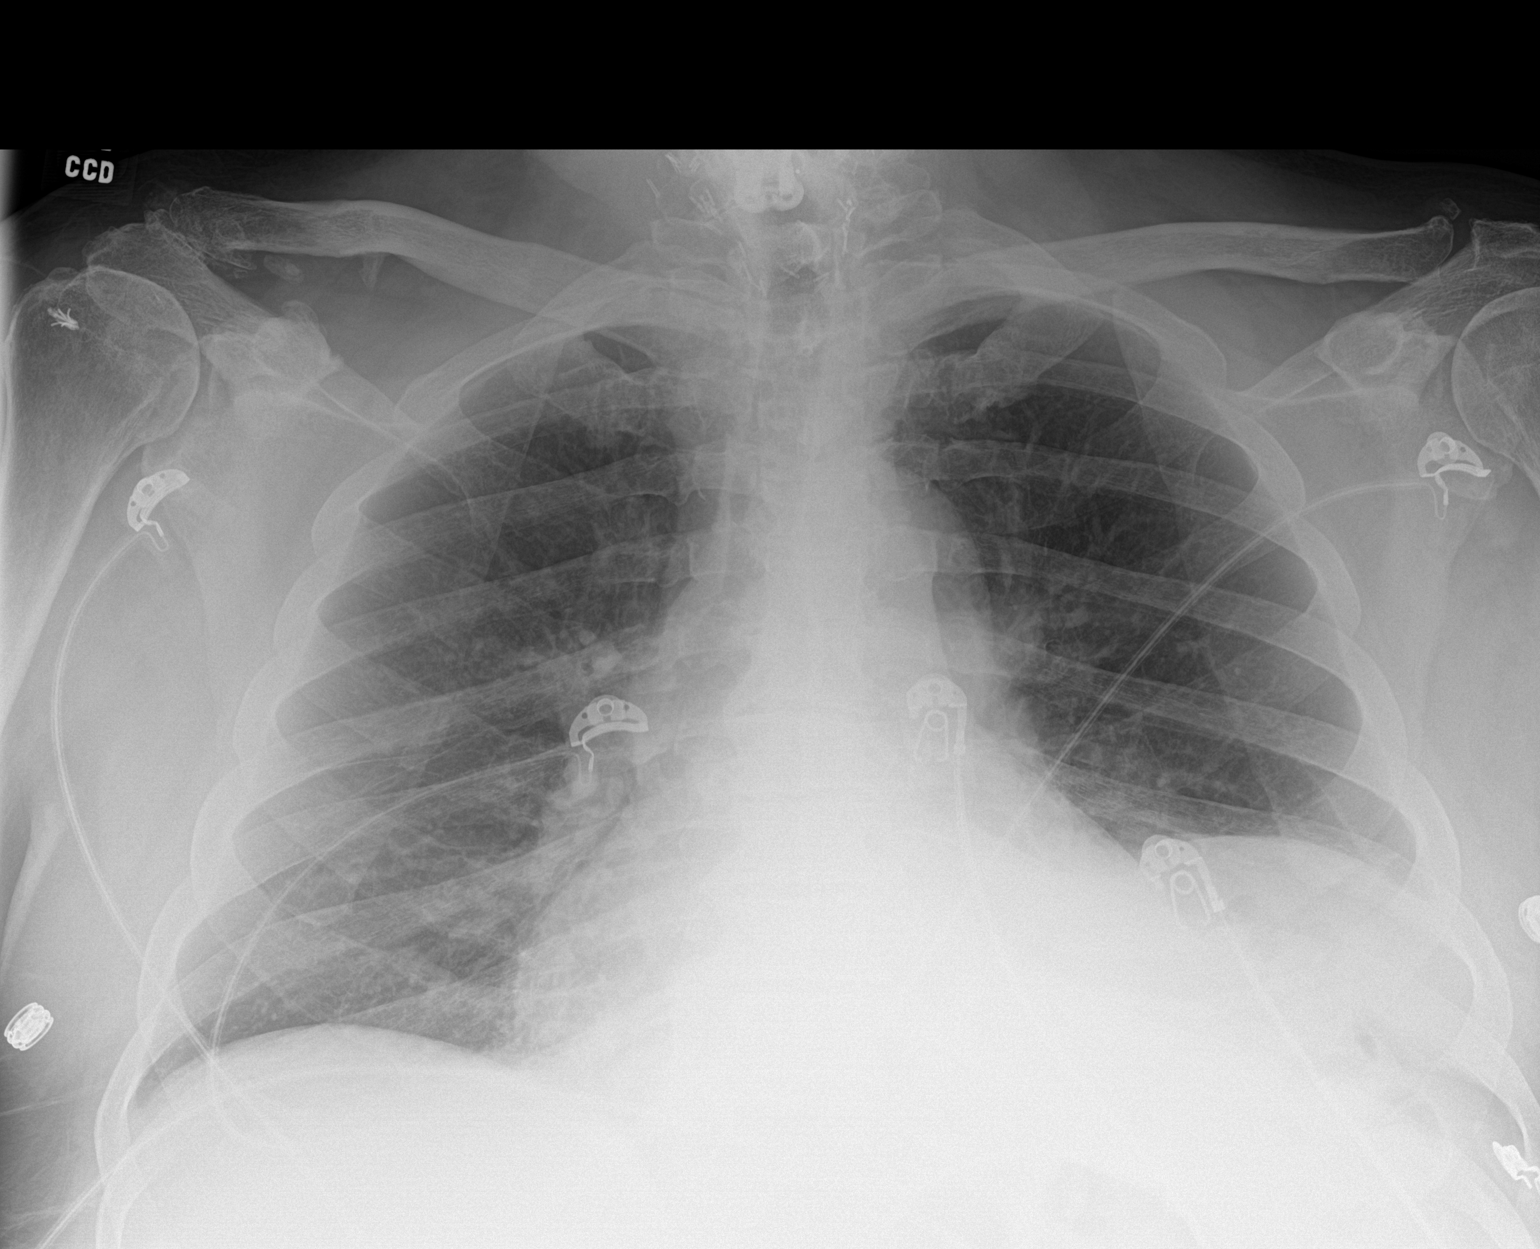

[1 of 1 positions shown; findings below may reference images not displayed]

FINDINGS: Chronic cardiomegaly and aortic tortuosity. The right chest is
clear. Left hemidiaphragm is elevated, versus the possible presence
of a sub pulmonic effusion. There is volume loss at the left lung
base.
IMPRESSION: Elevated left hemidiaphragm with volume loss at the left lung base.
Cannot rule a sub pulmonic effusion.

Cardiomegaly and aortic atherosclerosis.

## 2019-09-17 IMAGING — MR MR CERVICAL SPINE WO/W CM
5 of 8 series · 27 of 48 positions shown · IV contrast (gadavist)
Comparison: Prior MRI from [DATE].

CLINICAL DATA: Initial evaluation for generalized weakness,
inability to stand without assistance.

EXAM:
MRI CERVICAL SPINE WITHOUT AND WITH CONTRAST
TECHNIQUE: Multiplanar and multiecho pulse sequences of the cervical spine, to
include the craniocervical junction and cervicothoracic junction,
were obtained without and with intravenous contrast.
CONTRAST:  10mL GADAVIST GADOBUTROL 1 MMOL/ML IV SOLN

[Series 5: T1 · sagittal · 3.0mm · 0.69mm/px · 4 of 15 slices shown (1 of 2)]
[im 1/15]
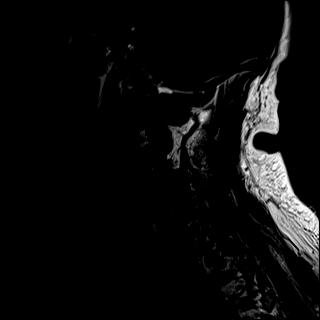
[im 5/15]
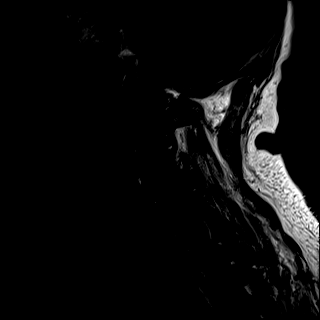
[im 10/15]
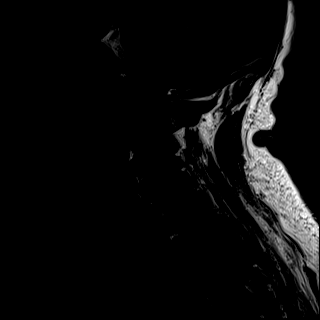
[im 15/15]
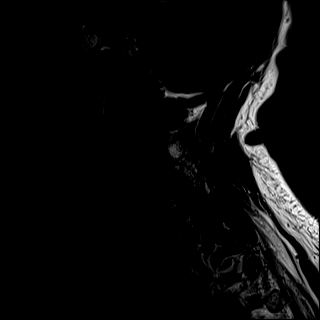

[Series 7: T2 · axial · 4.0mm · 0.70mm/px · z∈[-28,+76]mm · 8 of 27 slices shown]
[im 1/27]
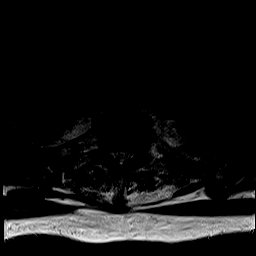
[im 4/27]
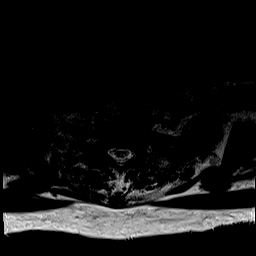
[im 8/27]
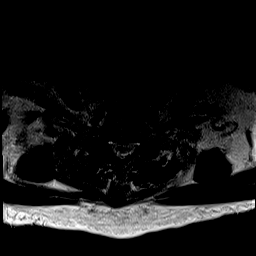
[im 12/27]
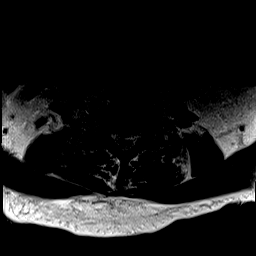
[im 15/27]
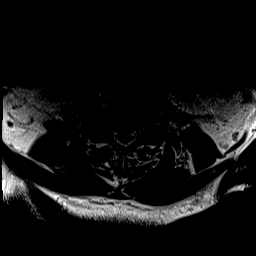
[im 19/27]
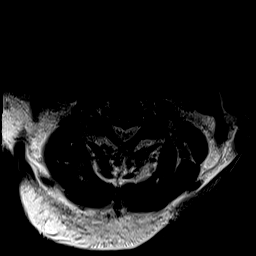
[im 23/27]
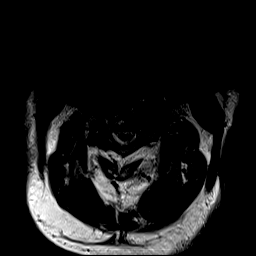
[im 27/27]
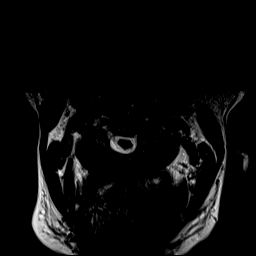

[Series 10: T1 · axial · 4.0mm · 0.39mm/px · z∈[-32,+72]mm · 8 of 27 slices shown (2 of 2)]
[im 1/27]
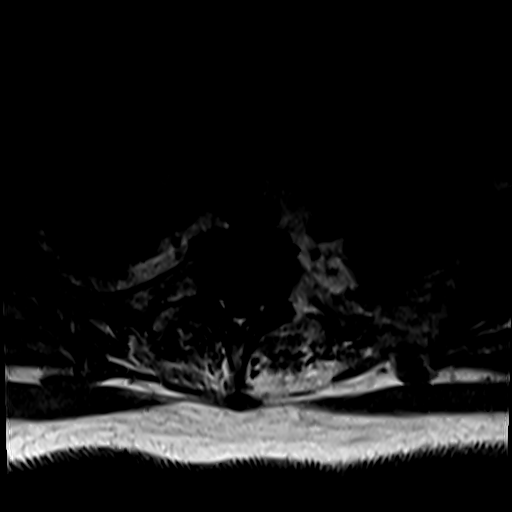
[im 4/27]
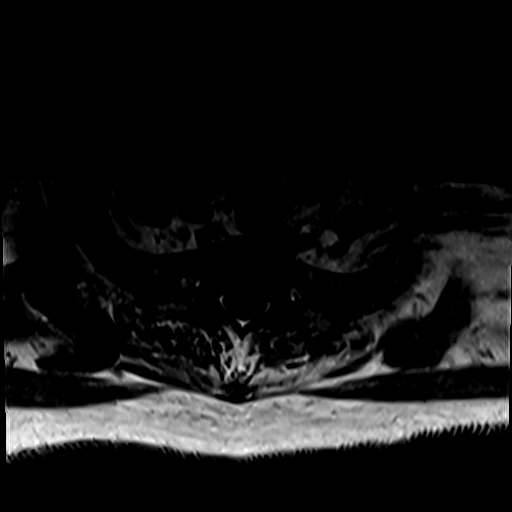
[im 8/27]
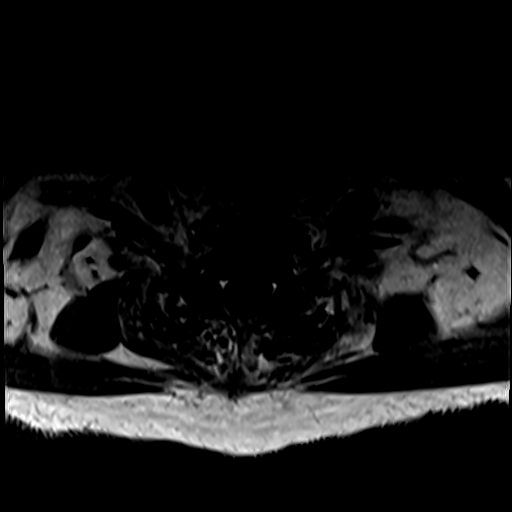
[im 12/27]
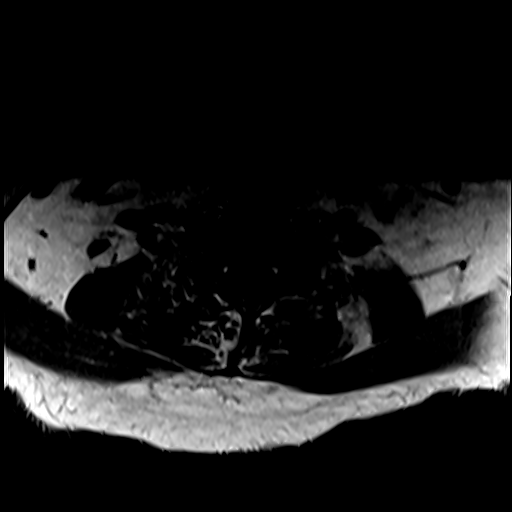
[im 15/27]
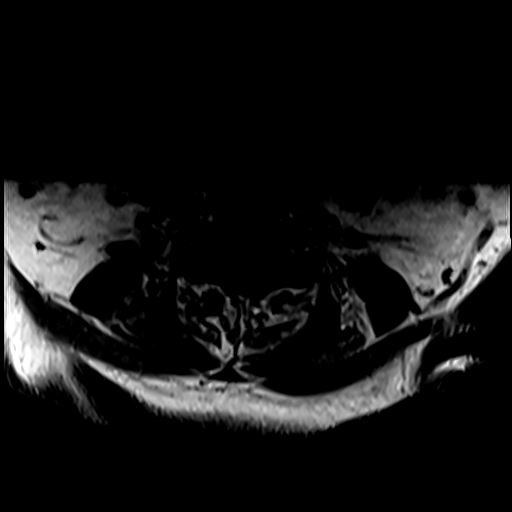
[im 19/27]
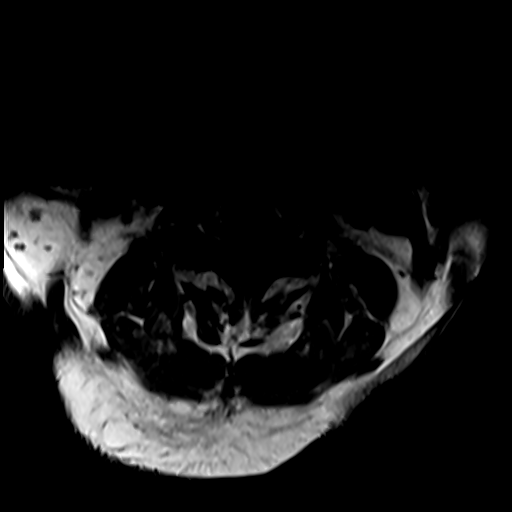
[im 23/27]
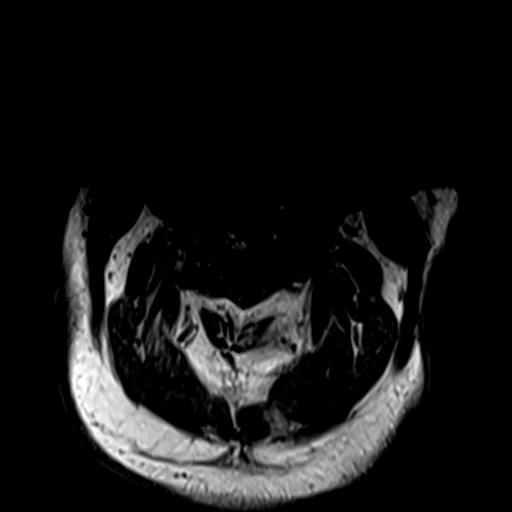
[im 27/27]
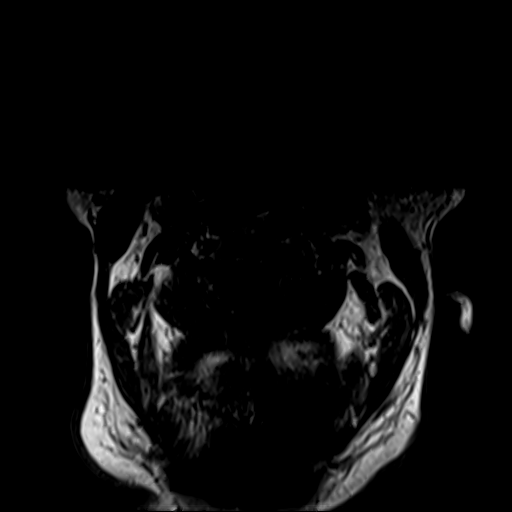

[Series 11: STIR · sagittal · 3.0mm · 0.86mm/px · 4 of 15 slices shown]
[im 1/15]
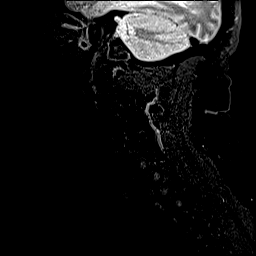
[im 5/15]
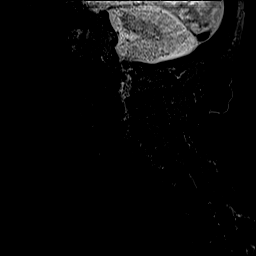
[im 10/15]
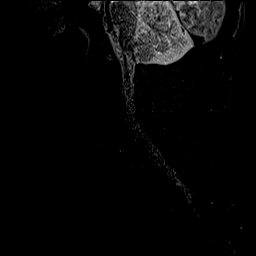
[im 15/15]
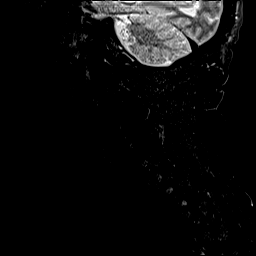

[Series 12: T1 post-contrast · sagittal · 3.0mm · 0.43mm/px · 3 of 15 slices shown]
[im 1/15]
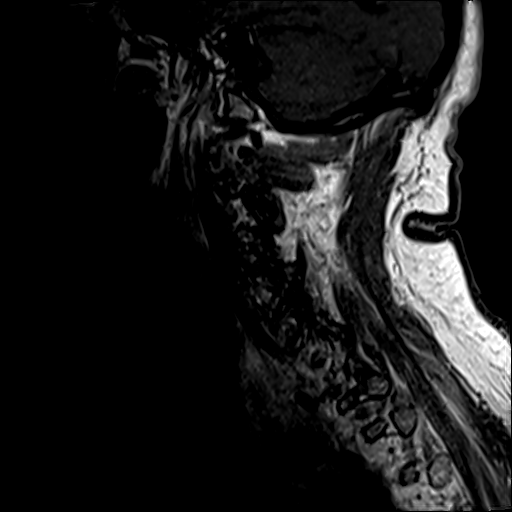
[im 5/15]
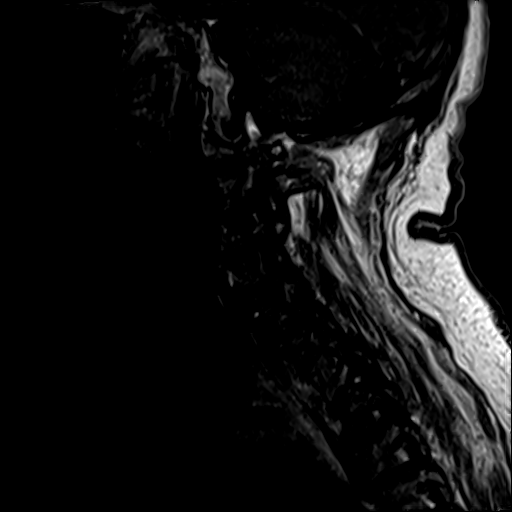
[im 10/15]
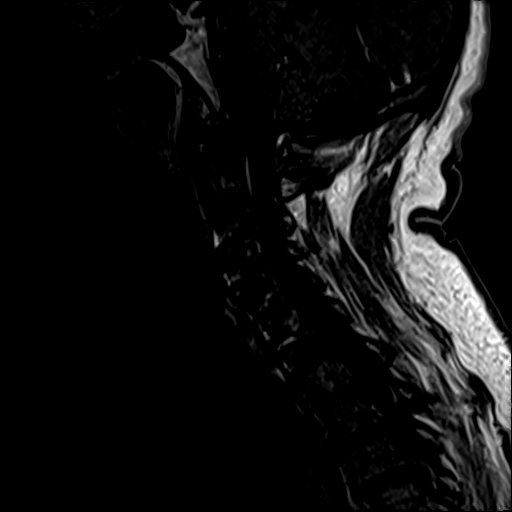

[27 of 48 positions shown; findings below may reference images not displayed]

FINDINGS: Alignment: Examination moderately degraded by motion artifact,
somewhat limiting assessment.

Straightening of the normal cervical lordosis. Trace 2 mm
retrolisthesis of C2 on C3, with 2-3 mm anterolisthesis of C6 on C7
and C7 on T1. Findings chronic and facet mediated.

Vertebrae: Susceptibility artifact from prior ACDF at C3-C6.
Vertebral body height maintained without evidence for acute or
chronic fracture. Bone marrow signal intensity within normal limits.
No visible discrete or worrisome osseous lesions on the on this
motion degraded exam. No abnormal marrow edema or enhancement.

Cord: Probable patchy cord signal abnormality within the cervical
spinal cord at the level of C3-4, consistent with chronic
myelomalacia related to previously seen compressive stenosis. No
other definite cord signal changes on this motion degraded exam. No
abnormal enhancement.

Posterior Fossa, vertebral arteries, paraspinal tissues: Visualized
brain and posterior fossa within normal limits. Retro cerebellar
cyst versus mega cisterna magna noted. Craniocervical junction
within normal limits. Paraspinous and prevertebral soft tissues
unremarkable. Normal intravascular flow voids seen within the
vertebral arteries bilaterally.

Disc levels:

C2-C3: Retrolisthesis. Mild diffuse disc bulge. Right greater than
left facet hypertrophy. No significant spinal stenosis or cord
deformity. Moderate left with mild right C3 foraminal stenosis.

C3-C4: Prior fusion. Residual posterior endplate osseous ridging
indents the ventral thecal sac with persistent mild to moderate
spinal stenosis. Bilateral facet hypertrophy. Persistent moderate to
severe right worse than left C4 foraminal narrowing. Appearance is
improved relative to previous exam.

C4-C5: Prior fusion. Residual posterior endplate osseous ridging
indents the ventral thecal sac with persistent mild spinal stenosis.
Moderate bilateral facet hypertrophy. Persistent moderate to severe
bilateral foraminal narrowing. Appearance is slightly improved from
previous exam.

C5-C6: Prior fusion. Residual central endplate osseous ridging
indents the ventral thecal sac, contacting and mildly flattening the
cervical spinal cord. Persistent mild to moderate spinal stenosis,
improved from previous. Bilateral facet hypertrophy with right
greater than left uncinate spurring with resultant moderate to
severe right worse than left C6 foraminal narrowing, also improved.

C6-C7: Anterolisthesis. Mild disc bulge with bilateral uncovertebral
hypertrophy. Flattening of the ventral thecal sac with no more than
mild spinal stenosis. Superimposed bilateral facet hypertrophy.
Moderate bilateral C7 foraminal narrowing, grossly stable.

C7-T1: Anterolisthesis. Negative interspace. Bilateral facet
hypertrophy. No significant spinal stenosis. Foramina remain patent.

T1-2: Diffuse disc bulge. Posterior element hypertrophy. Resultant
mild spinal stenosis with moderate left worse than right foraminal
narrowing, grossly similar.
IMPRESSION: 1. Technically limited exam due to extensive motion artifact.
2. Postoperative changes from prior ACDF at C3-C6. Residual endplate
osseous ridging with uncovertebral and facet hypertrophy at these
levels, with persistent mild to moderate diffuse spinal stenosis
with moderate to severe bilateral foraminal narrowing as above.
Overall, appearance is improved relative to preoperative exam from
3. Patchy cord signal abnormality at the level of C3-4, consistent
with chronic myelomalacia.

## 2019-09-17 IMAGING — MR MR THORACIC SPINE W/O CM
4 series · 45 of 48 positions shown · non-contrast
Comparison: None.

CLINICAL DATA: Mid back pain

EXAM:
MRI THORACIC SPINE WITHOUT CONTRAST
TECHNIQUE: Multiplanar, multisequence MR imaging of the thoracic spine was
performed. No intravenous contrast was administered.

[Series 17: STIR · sagittal · 3.0mm · 1.00mm/px · 8 of 15 slices shown]
[im 1/15]
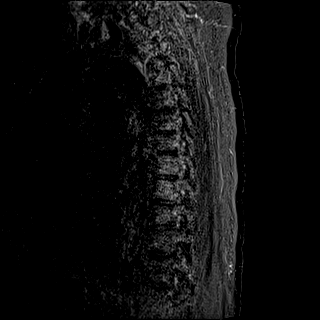
[im 2/15]
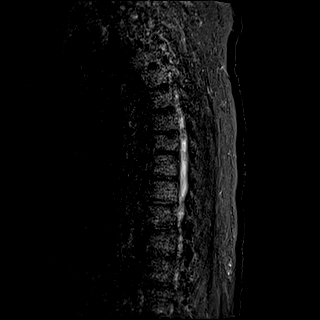
[im 5/15]
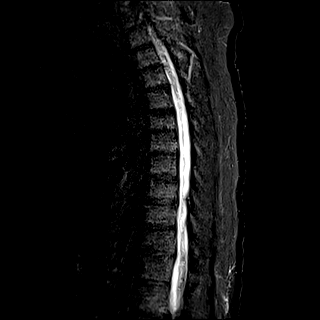
[im 7/15]
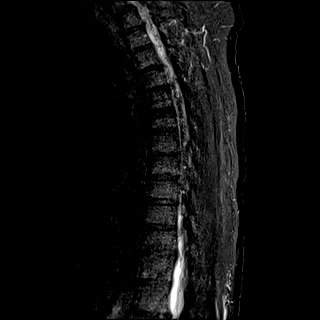
[im 8/15]
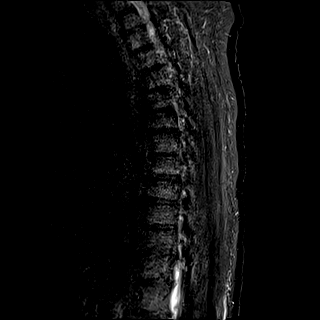
[im 10/15]
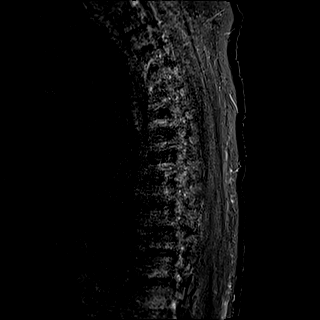
[im 13/15]
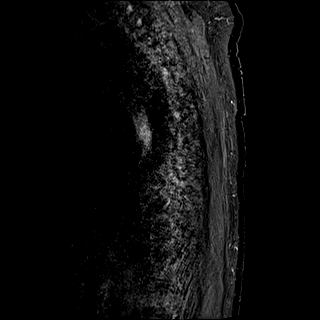
[im 15/15]
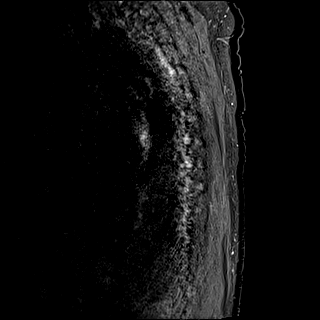

[Series 18: T1 · sagittal · 3.0mm · 1.00mm/px · 11 of 15 slices shown]
[im 1/15]
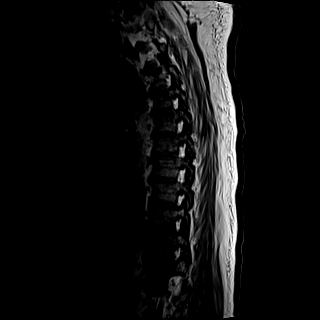
[im 2/15]
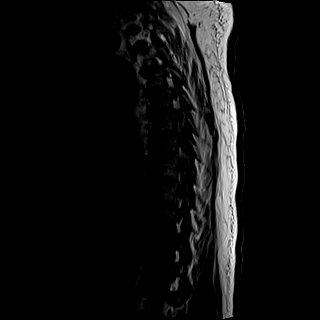
[im 3/15]
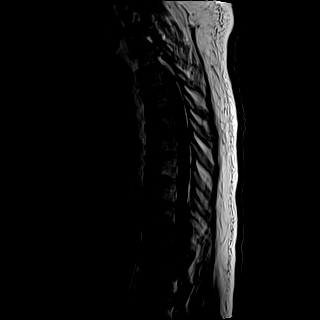
[im 5/15]
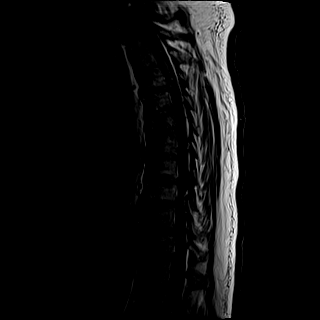
[im 6/15]
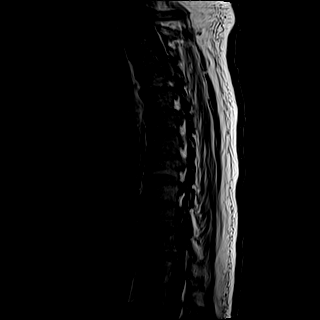
[im 8/15]
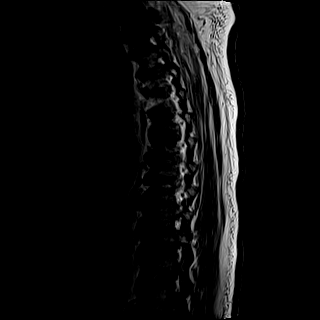
[im 9/15]
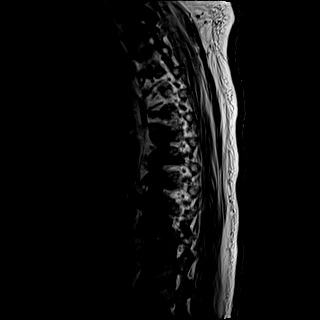
[im 10/15]
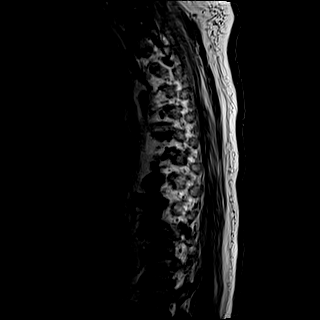
[im 12/15]
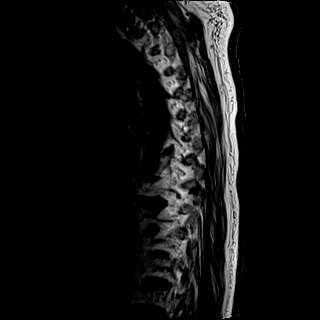
[im 13/15]
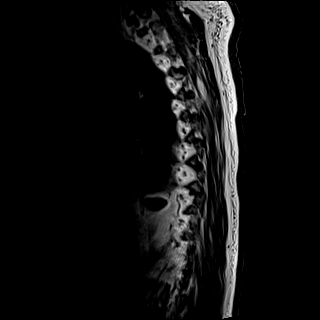
[im 15/15]
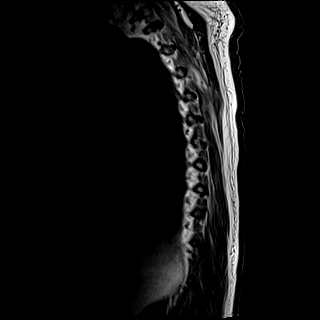

[Series 20: T2 · axial · 4.0mm · 0.78mm/px · z∈[-308,-67]mm · 14 of 20 slices shown (1 of 2)]
[im 1/20]
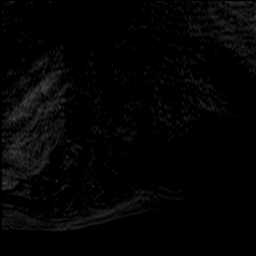
[im 2/20]
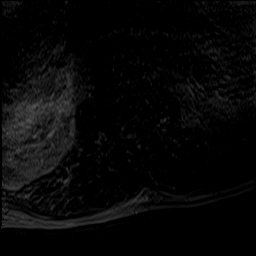
[im 3/20]
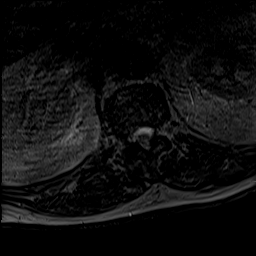
[im 5/20]
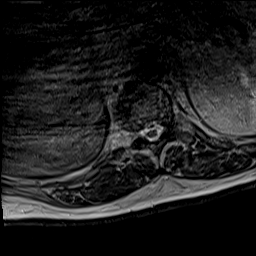
[im 6/20]
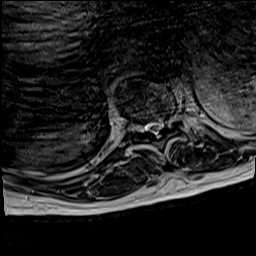
[im 8/20]
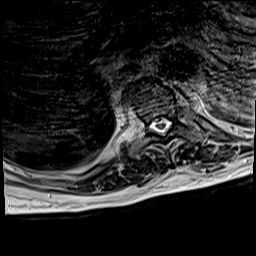
[im 9/20]
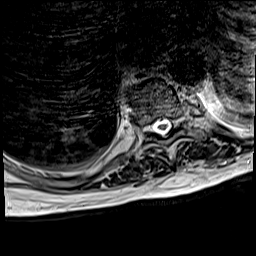
[im 11/20]
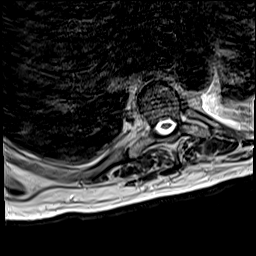
[im 12/20]
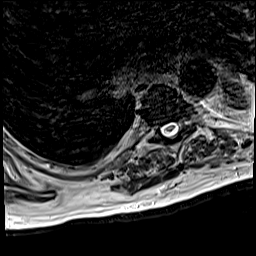
[im 14/20]
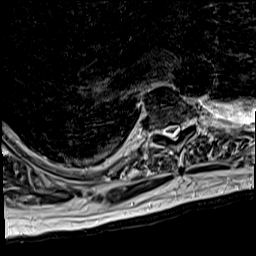
[im 15/20]
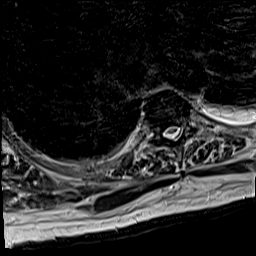
[im 17/20]
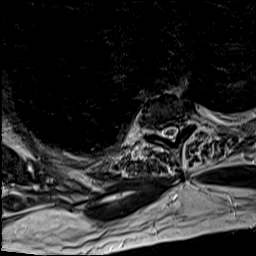
[im 18/20]
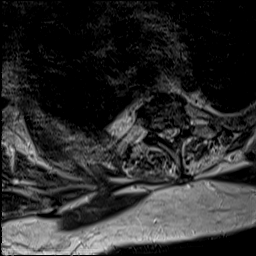
[im 20/20]
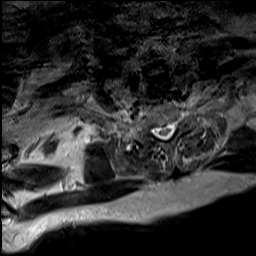

[Series 21: T2 · sagittal · 3.0mm · 0.83mm/px · 12 of 18 slices shown (2 of 2)]
[im 1/18]
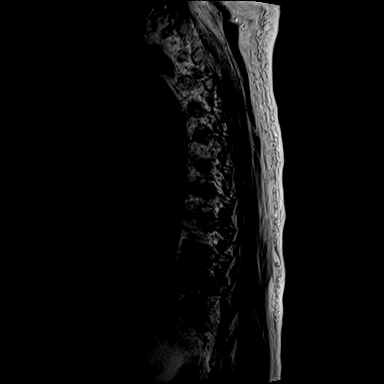
[im 2/18]
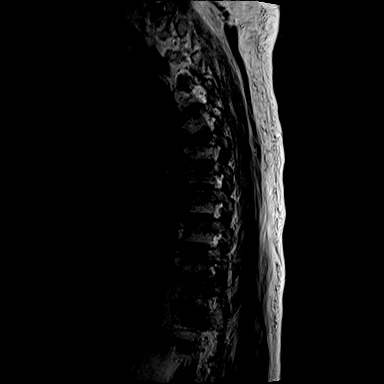
[im 3/18]
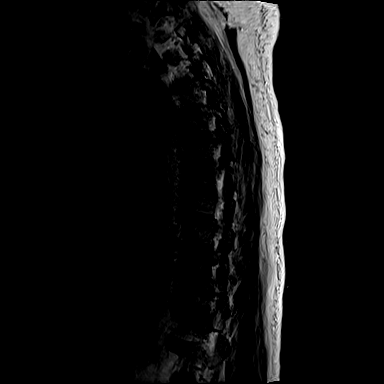
[im 5/18]
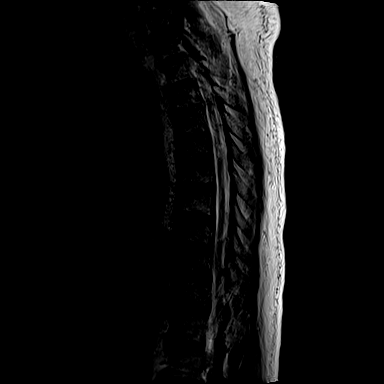
[im 6/18]
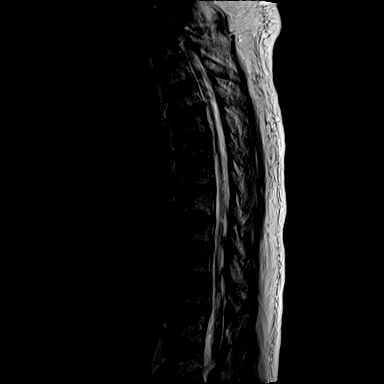
[im 8/18]
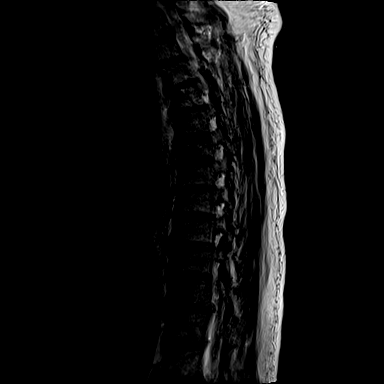
[im 9/18]
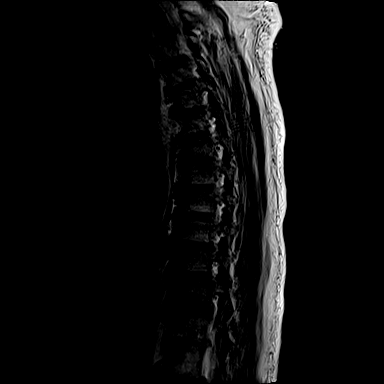
[im 10/18]
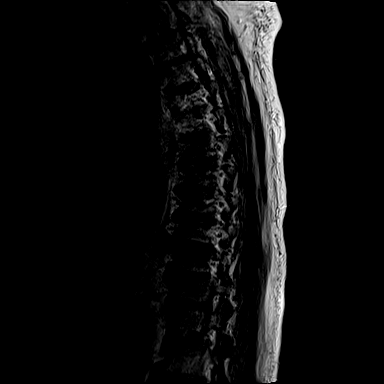
[im 12/18]
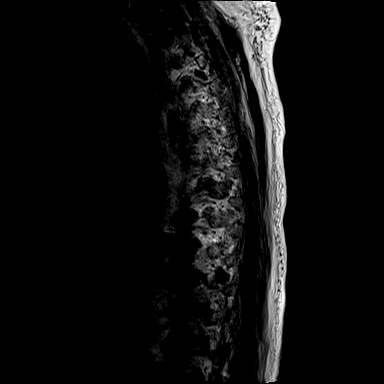
[im 13/18]
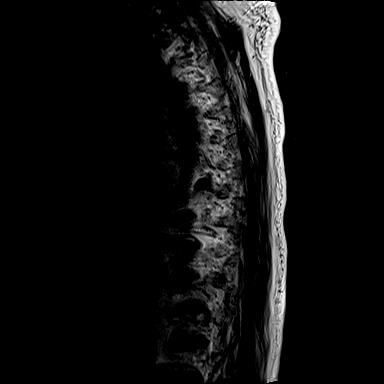
[im 15/18]
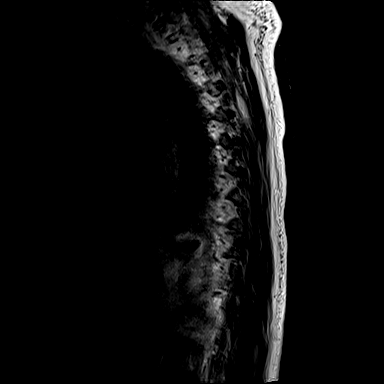
[im 18/18]
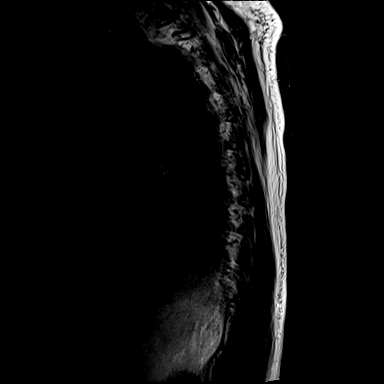

[45 of 48 positions shown; findings below may reference images not displayed]

FINDINGS: The examination is degraded by motion. Axial gradient echo images
were not obtained.

Alignment: Physiologic.

Vertebrae: No fracture, evidence of discitis, or bone lesion.

Cord: There is an area of hyperintense T2-weighted signal within the
lower thoracic spinal cord. The spinal cord is otherwise poorly
evaluated due to motion.

Paraspinal and other soft tissues: Small left pleural effusion.

Disc levels:

There is degenerative disc disease at multiple lower thoracic levels
without high-grade spinal canal stenosis.
IMPRESSION: 1. Limited examination due to motion artifact and lack of IV
contrast.
2. There is an area of hyperintense T2-weighted signal within the
lower thoracic spinal cord. This is incompletely evaluated due to
motion artifact and lack of IV contrast. Given the limitations of
the study, a repeat examination with sedation or anesthesia might be
helpful.
3. Multilevel degenerative disc disease of the lower thoracic spine
without high-grade spinal canal stenosis.

## 2019-09-17 IMAGING — CT CT HEAD W/O CM
3 series · 16 of 47 positions shown, 19 images · non-contrast
Comparison: [DATE]

CLINICAL DATA: Altered mental status

EXAM:
CT HEAD WITHOUT CONTRAST
TECHNIQUE: Contiguous axial images were obtained from the base of the skull
through the vertex without intravenous contrast.

[Series 3: head wo · axial · 0.44mm/px · z∈[-152,-17]mm · 10 of 33 slices shown, 13 images]
[im 3/33  brain]
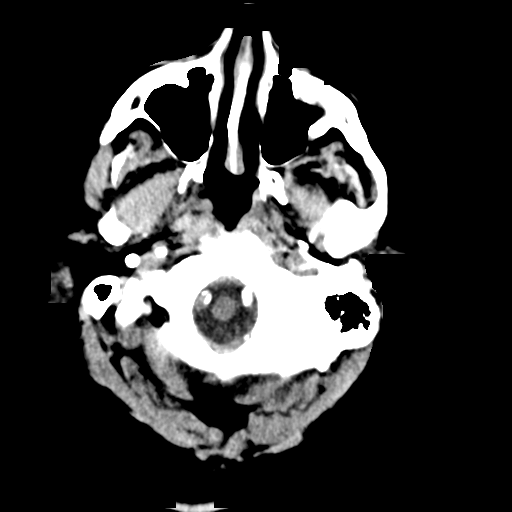
[im 3/33  bone]
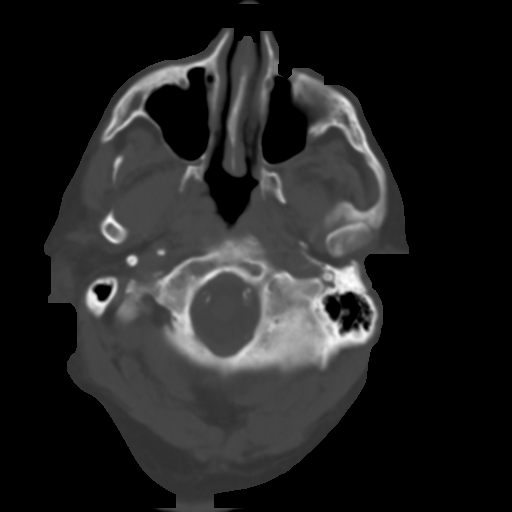
[im 6/33  brain]
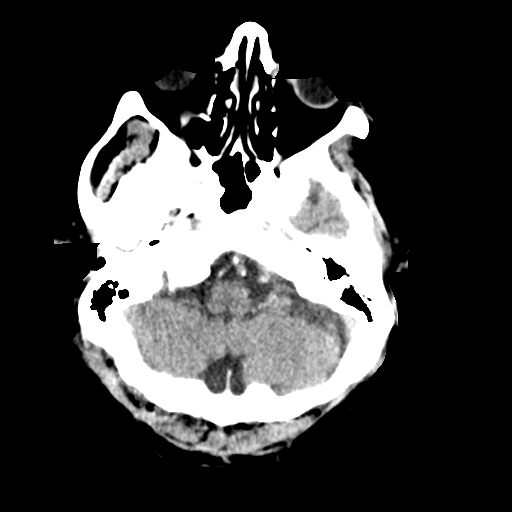
[im 9/33  brain]
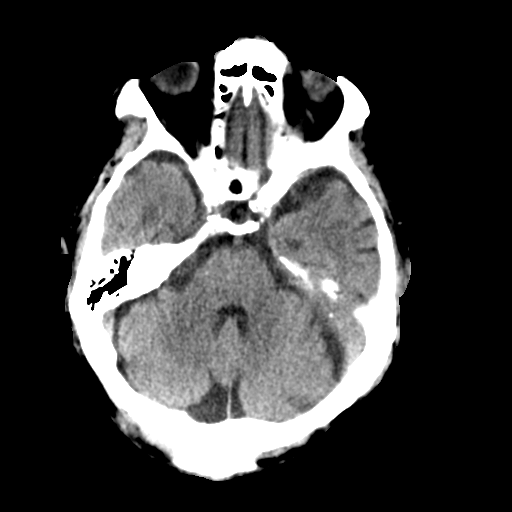
[im 12/33  brain]
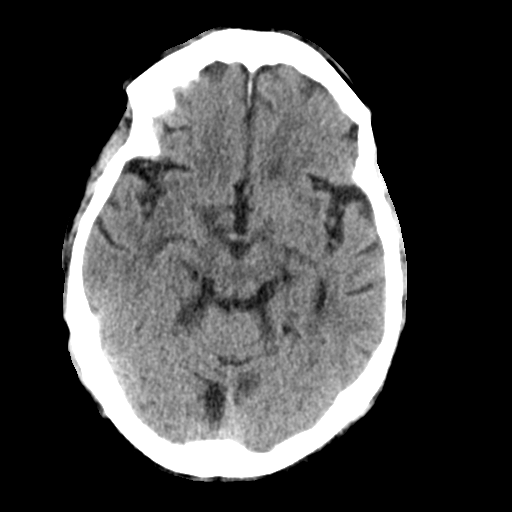
[im 15/33  brain]
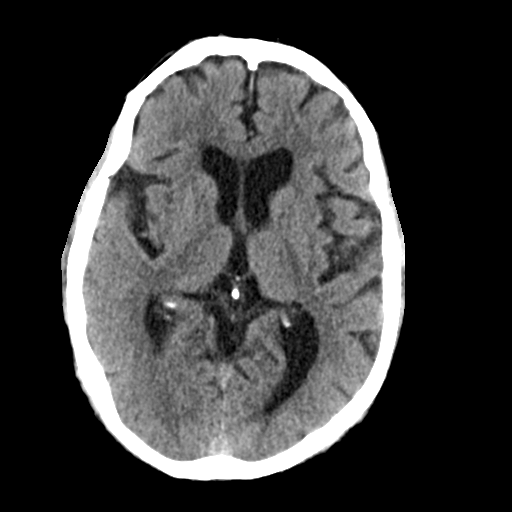
[im 15/33  bone]
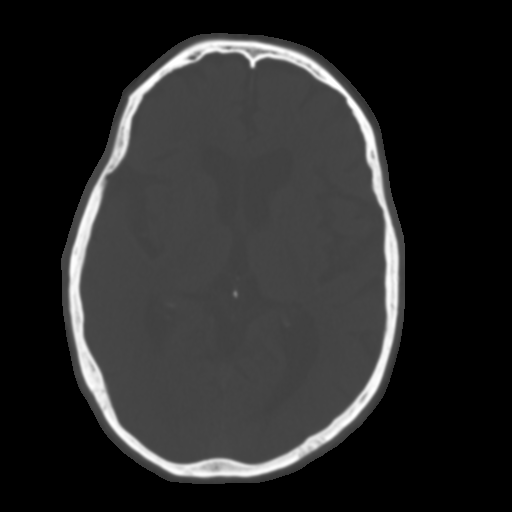
[im 18/33  brain]
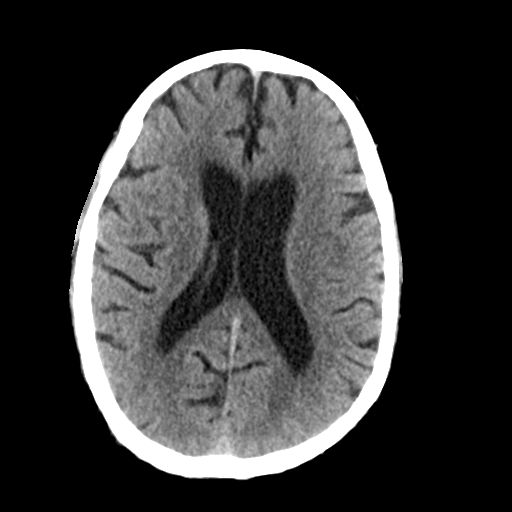
[im 21/33  brain]
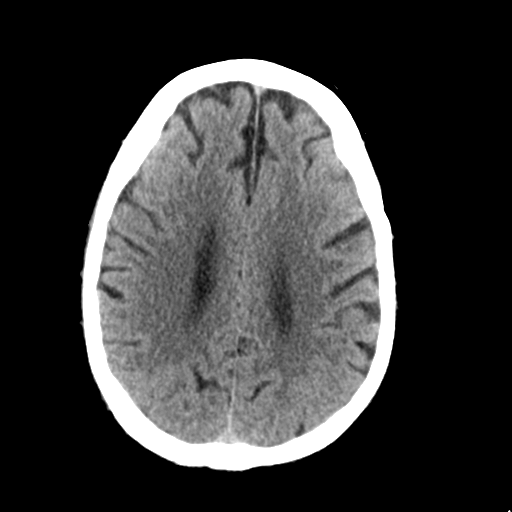
[im 25/33  brain]
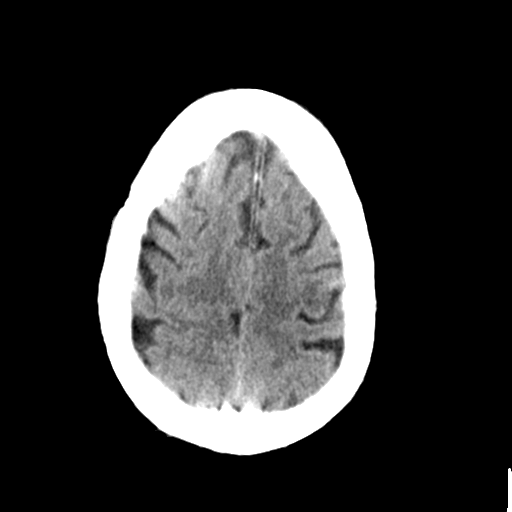
[im 27/33  brain]
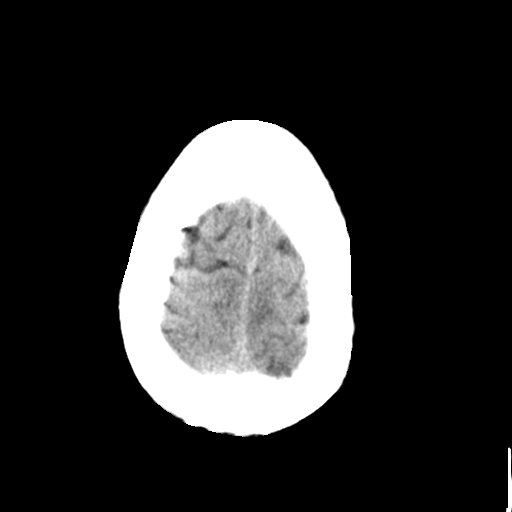
[im 27/33  bone]
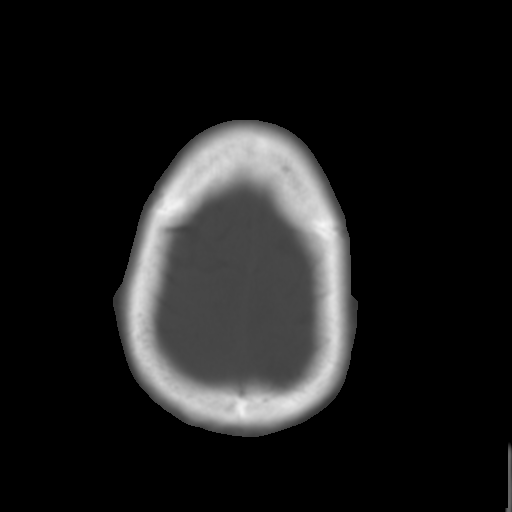
[im 30/33  brain]
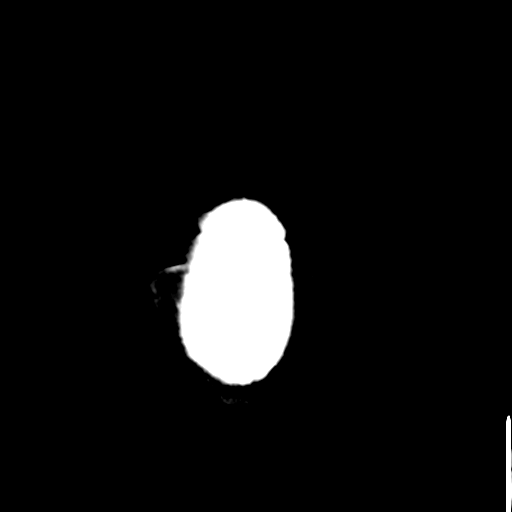

[Series 4: coronal soft tissue · coronal · 0.35mm/px · 3 of 77 slices shown]
[im 26/77  brain]
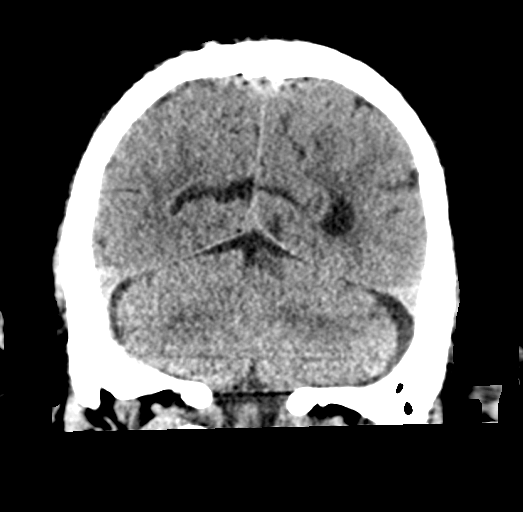
[im 34/77  brain]
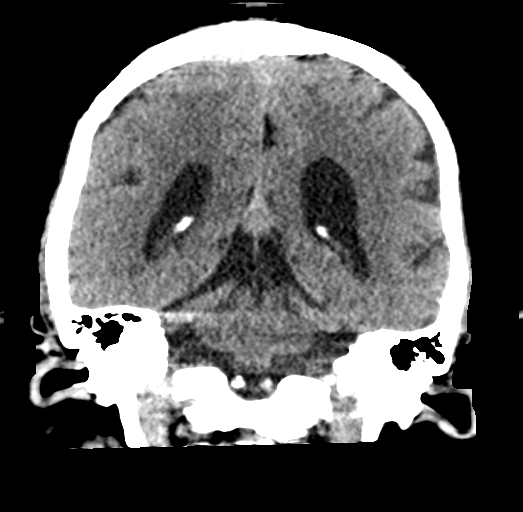
[im 43/77  brain]
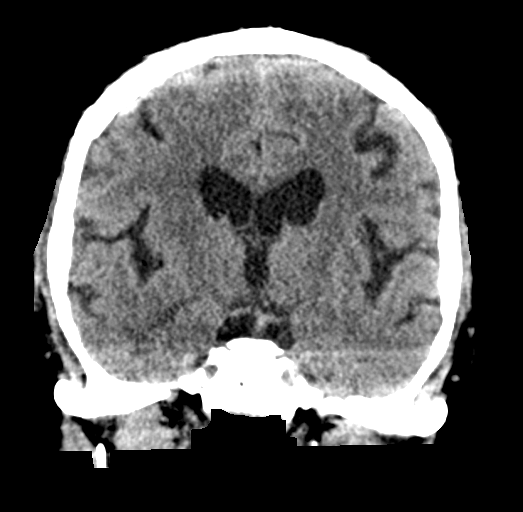

[Series 5: sagittal soft tissue · sagittal · 0.35mm/px · 3 of 62 slices shown]
[im 21/62  brain]
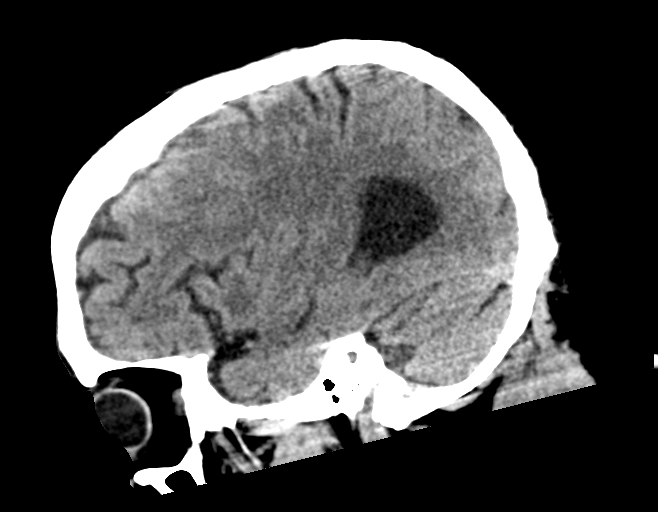
[im 31/62  brain]
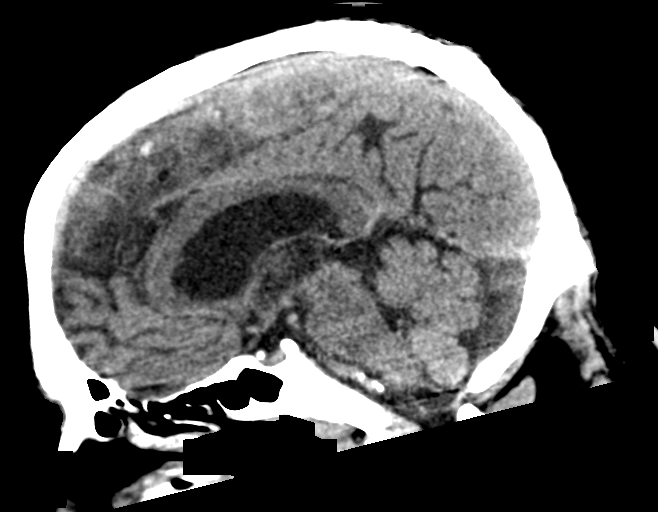
[im 41/62  brain]
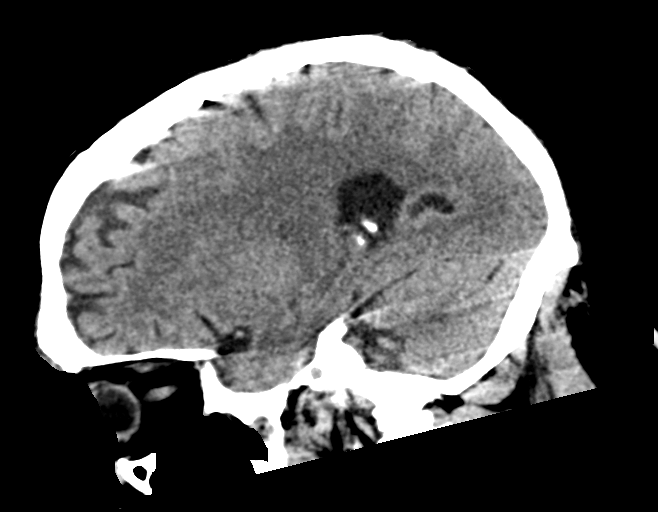

[16 of 47 positions shown; findings below may reference images not displayed]

FINDINGS: Brain: No evidence of acute infarction, hemorrhage, hydrocephalus,
extra-axial collection or mass lesion/mass effect. Scattered
low-density changes within the periventricular and subcortical white
matter compatible with chronic microvascular ischemic change. Mild
diffuse cerebral volume loss.

Vascular: Atherosclerotic calcifications involving the large vessels
of the skull base. No unexpected hyperdense vessel.

Skull: Normal. Negative for fracture or focal lesion.

Sinuses/Orbits: No acute finding.

Other: None.
IMPRESSION: 1.  No acute intracranial findings.

2.  Chronic microvascular ischemic change and cerebral volume loss.

## 2019-09-17 MED ORDER — GADOBUTROL 1 MMOL/ML IV SOLN
10.0000 mL | Freq: Once | INTRAVENOUS | Status: AC | PRN
  Administered 2019-09-17: 10 mL via INTRAVENOUS

## 2019-09-17 MED ORDER — SIMVASTATIN 40 MG PO TABS
40.0000 mg | ORAL_TABLET | Freq: Every day | ORAL | Status: DC
  Administered 2019-09-18 – 2019-09-25 (×8): 40 mg via ORAL
  Filled 2019-09-17 (×8): qty 1

## 2019-09-17 MED ORDER — LORAZEPAM 2 MG/ML IJ SOLN
2.0000 mg | Freq: Once | INTRAMUSCULAR | Status: AC
  Administered 2019-09-17: 2 mg via INTRAVENOUS

## 2019-09-17 MED ORDER — LORAZEPAM 2 MG/ML IJ SOLN
2.0000 mg | Freq: Once | INTRAMUSCULAR | Status: DC
  Filled 2019-09-17: qty 1

## 2019-09-17 MED ORDER — ALLOPURINOL 300 MG PO TABS
300.0000 mg | ORAL_TABLET | Freq: Every day | ORAL | Status: DC
  Administered 2019-09-18 – 2019-09-26 (×9): 300 mg via ORAL
  Filled 2019-09-17 (×9): qty 1

## 2019-09-17 MED ORDER — LEVETIRACETAM 250 MG PO TABS
250.0000 mg | ORAL_TABLET | Freq: Two times a day (BID) | ORAL | Status: DC
  Administered 2019-09-18 – 2019-09-26 (×18): 250 mg via ORAL
  Filled 2019-09-17 (×20): qty 1

## 2019-09-17 MED ORDER — FLUTICASONE-UMECLIDIN-VILANT 100-62.5-25 MCG/INH IN AEPB: 1.0000 | INHALATION_SPRAY | Freq: Every day | RESPIRATORY_TRACT | Status: DC

## 2019-09-17 MED ORDER — NITROGLYCERIN 0.4 MG SL SUBL: 0.4000 mg | SUBLINGUAL_TABLET | SUBLINGUAL | Status: DC | PRN

## 2019-09-17 MED ORDER — ENOXAPARIN SODIUM 40 MG/0.4ML ~~LOC~~ SOLN
40.0000 mg | Freq: Every day | SUBCUTANEOUS | Status: DC
  Administered 2019-09-19 – 2019-09-26 (×8): 40 mg via SUBCUTANEOUS
  Filled 2019-09-17 (×10): qty 0.4

## 2019-09-17 MED ORDER — ONDANSETRON HCL 4 MG/2ML IJ SOLN: 4.0000 mg | Freq: Once | INTRAMUSCULAR | Status: DC

## 2019-09-17 MED ORDER — PANTOPRAZOLE SODIUM 40 MG PO TBEC
40.0000 mg | DELAYED_RELEASE_TABLET | Freq: Every evening | ORAL | Status: DC
  Administered 2019-09-18 – 2019-09-25 (×8): 40 mg via ORAL
  Filled 2019-09-17 (×8): qty 1

## 2019-09-17 MED ORDER — BACLOFEN 10 MG PO TABS
10.0000 mg | ORAL_TABLET | Freq: Three times a day (TID) | ORAL | Status: DC
  Administered 2019-09-18 – 2019-09-19 (×5): 10 mg via ORAL
  Filled 2019-09-17 (×5): qty 1

## 2019-09-17 MED ORDER — IPRATROPIUM-ALBUTEROL 0.5-2.5 (3) MG/3ML IN SOLN: 3.0000 mL | Freq: Four times a day (QID) | RESPIRATORY_TRACT | Status: DC | PRN

## 2019-09-17 MED ORDER — LEVOTHYROXINE SODIUM 125 MCG PO TABS
125.0000 ug | ORAL_TABLET | Freq: Every day | ORAL | Status: DC
  Administered 2019-09-18 – 2019-09-26 (×9): 125 ug via ORAL
  Filled 2019-09-17 (×11): qty 1

## 2019-09-17 MED ORDER — ADULT MULTIVITAMIN W/MINERALS CH
1.0000 | ORAL_TABLET | Freq: Every day | ORAL | Status: DC
  Administered 2019-09-18 – 2019-09-26 (×9): 1 via ORAL
  Filled 2019-09-17 (×9): qty 1

## 2019-09-17 MED ORDER — HYDROCODONE-ACETAMINOPHEN 5-325 MG PO TABS
1.0000 | ORAL_TABLET | Freq: Three times a day (TID) | ORAL | Status: DC | PRN
  Administered 2019-09-18 – 2019-09-25 (×9): 1 via ORAL
  Filled 2019-09-17 (×9): qty 1

## 2019-09-17 MED ORDER — CARBIDOPA-LEVODOPA 10-100 MG PO TABS
1.0000 | ORAL_TABLET | Freq: Two times a day (BID) | ORAL | Status: DC
  Administered 2019-09-17 – 2019-09-26 (×18): 1 via ORAL
  Filled 2019-09-17 (×19): qty 1

## 2019-09-17 MED ORDER — LACTATED RINGERS IV SOLN: INTRAVENOUS | Status: DC

## 2019-09-17 MED ORDER — ASPIRIN EC 81 MG PO TBEC
81.0000 mg | DELAYED_RELEASE_TABLET | Freq: Every day | ORAL | Status: DC
  Administered 2019-09-18 – 2019-09-26 (×9): 81 mg via ORAL
  Filled 2019-09-17 (×9): qty 1

## 2019-09-17 MED ORDER — SODIUM CHLORIDE 0.9 % IV SOLN: INTRAVENOUS | Status: DC

## 2019-09-17 MED ORDER — MORPHINE SULFATE (PF) 4 MG/ML IV SOLN: 4.0000 mg | Freq: Once | INTRAVENOUS | Status: DC

## 2019-09-17 MED ORDER — GABAPENTIN 100 MG PO CAPS
100.0000 mg | ORAL_CAPSULE | Freq: Every day | ORAL | Status: DC
  Administered 2019-09-18 – 2019-09-25 (×8): 100 mg via ORAL
  Filled 2019-09-17 (×8): qty 1

## 2019-09-17 MED ORDER — SODIUM CHLORIDE 0.9 % IV BOLUS
1000.0000 mL | Freq: Once | INTRAVENOUS | Status: AC
  Administered 2019-09-17: 1000 mL via INTRAVENOUS

## 2019-09-17 MED ORDER — ALBUTEROL SULFATE (2.5 MG/3ML) 0.083% IN NEBU: 2.5000 mg | INHALATION_SOLUTION | RESPIRATORY_TRACT | Status: DC | PRN

## 2019-09-17 MED ORDER — COLCHICINE 0.6 MG PO TABS
0.6000 mg | ORAL_TABLET | Freq: Every day | ORAL | Status: DC
  Administered 2019-09-18 – 2019-09-26 (×9): 0.6 mg via ORAL
  Filled 2019-09-17 (×9): qty 1

## 2019-09-17 MED ORDER — LORAZEPAM 2 MG/ML IJ SOLN
INTRAMUSCULAR | Status: AC
  Filled 2019-09-17: qty 1

## 2019-09-17 MED ORDER — INSULIN ASPART 100 UNIT/ML ~~LOC~~ SOLN
0.0000 [IU] | SUBCUTANEOUS | Status: DC
  Filled 2019-09-17: qty 0.15

## 2019-09-17 MED ORDER — SODIUM CHLORIDE 0.9 % IV SOLN
1.0000 g | Freq: Once | INTRAVENOUS | Status: AC
  Administered 2019-09-17: 1 g via INTRAVENOUS
  Filled 2019-09-17: qty 10

## 2019-09-17 MED ORDER — LATANOPROST 0.005 % OP SOLN
1.0000 [drp] | Freq: Every day | OPHTHALMIC | Status: DC
  Administered 2019-09-18 – 2019-09-25 (×8): 1 [drp] via OPHTHALMIC
  Filled 2019-09-17: qty 2.5

## 2019-09-17 NOTE — ED Notes (Signed)
Pt provided ham sandwich and water, per his request.  This RN noticed that bed was wet with urine, offered to assist pt to get into clean brief, pt declined at this time stating "I havent eaten since 8 this morning so I want to eat."

## 2019-09-17 NOTE — ED Notes (Signed)
X-ray at bedside

## 2019-09-17 NOTE — Progress Notes (Signed)
CPAP on hold at this time. Pending COVID test

## 2019-09-17 NOTE — H&P (Signed)
[facility] History and Physical  [name] [id number] DOB: [birth year] DOA: 09/17/2019  Referring physician:  PCP: [name], MD   Chief Complaint: Unable to ambulate acutely  HPI: [name] is a 79 y.o. 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  This been going on for a couple days.  Got to the point where he is having trouble getting around his house.  Denies any fall or injury.  His chronic low back pain with radiation down the left leg that he does not think is significantly changed.  Denies cough denies congestion denies fever.  Denies loss of bowel or bladder denies loss peritoneal sensation.  Has chronic numbness and weakness to his left leg that is unchanged.  Denies recent head injury.  Denies dark stool or blood in his stool.  Thinks he has been eating and drinking normally.  -Patient states can normally ambulate short distances with walker.  Chronic LEFT hemiparesis.  Chronic L LE spasm, states approximately 2 weeks ago began to have increased L LE weakness with pain.  Woke up this morning unable to ambulate at all.  Increased L LE pain/decreased sensation lateral aspect of L LE.  States negative increased back pain.  Negative increased stool/urine incontinence.   Review of Systems:  Constitutional:  No weight loss, night sweats, Fevers, chills, fatigue.  HEENT:  No headaches, Difficulty swallowing,Tooth/dental problems,Sore throat,  No sneezing, itching, ear ache, nasal congestion, post nasal drip,  Cardio-vascular:  No chest  pain, Orthopnea, PND, swelling in lower extremities, anasarca, dizziness, palpitations  GI:  No heartburn, indigestion, abdominal pain, nausea, vomiting, diarrhea, change in bowel habits, loss of appetite  Resp:  Positive chronic S OB,  No excess mucus, no productive cough, No non-productive cough, No coughing up of blood.No change in color of mucus.No wheezing.No chest wall deformity  Skin:  no rash or lesions.  GU:  no dysuria, change in color of urine, no urgency or frequency. No flank pain.  Musculoskeletal:  Positive swelling of L LE calf with pain to palpation, mild cyanosis, DP/PT pulse palpable Psych:  No change in mood or affect. No depression or anxiety. No memory loss.  Neuro; Positive bilateral lower extremity weakness (acute on chronic), positive lateral aspect L LE loss of sensation (acute),  Past Medical History:  Diagnosis Date  . Arthritis   . Barrett esophagus   . Bronchitis   . Carotid artery occlusion   . COPD (chronic obstructive pulmonary disease) ([place])   . Diabetes mellitus without complication ([place])   . GERD (gastroesophageal reflux disease)   . Headache    migraines  . History of thyroid cancer 2008  . Hypertension   . Restless leg   . Sleep apnea with use of continuous positive airway pressure (CPAP)    2011 [facility] , [facility]  77cn central and obstrcutive. 16 cm water , 3 cm EPR.   . Thoracic ascending aortic aneurysm (HCC)    4.2 cm ascending TAA 08/18/17 CTA. Annual imaging recommended.  . thyroid ca dx'd 2009 or 2010   surg and radioactive isoptope  . Ulcer    Peptic ulcer disease  Past Surgical History:  Procedure Laterality Date  . ANTERIOR CERVICAL DECOMP/DISCECTOMY FUSION N/A 09/03/2017   Procedure: ACDF - C3-C4 - C4-C5 - C5-C6;  Surgeon: Kary Kos, MD;  Location: [facility];  Service: Neurosurgery;  Laterality: N/A;  . CAROTID ENDARTERECTOMY Left January 03, 2006   Dr. Amedeo Plenty  . COLONOSCOPY    . EYE SURGERY Bilateral    cataract surgery with  lens implants  . game keepers thumb Right   . JOINT REPLACEMENT Left    knee X 2  . ROTATOR CUFF REPAIR Right   . THYROIDECTOMY  2008   Social History:  reports that he quit smoking about 14 years ago. His smoking use included cigars and cigarettes. He quit after 40.00 years of use. He has never used smokeless tobacco. He reports that he does not drink alcohol or use drugs.  No Known Allergies  Family History  Problem Relation Age of Onset  . COPD Mother   . Hyperlipidemia Mother   . Hypertension Mother   . Diabetes Father   . Kidney disease Father        ESRD  . Hypertension Father   . Cancer Father   . Hyperlipidemia Father     Prior to Admission medications   Medication Sig Start Date End Date Taking? Authorizing Provider  acetaminophen (TYLENOL) 325 MG tablet Take 2 tablets (650 mg total) by mouth every 4 (four) hours as needed for mild pain ((score 1 to 3) or temp > 100.5). 09/06/17   Kary Kos, MD  allopurinol (ZYLOPRIM) 300 MG tablet Take 300 mg by mouth daily.  06/02/15   [provider]  baclofen (LIORESAL) 10 MG tablet TAKE 1 TABLET BY MOUTH 3 TIMES DAILY. 06/23/19   Ward Givens, NP  calcium citrate-vitamin D (CITRACAL+D) 315-200 MG-UNIT per tablet Take 1 tablet by mouth daily. 750 mg     [provider]  carbidopa-levodopa (SINEMET IR) 10-100 MG tablet Take 1 tablet by mouth 2 (two) times daily.     [provider]  cholecalciferol (VITAMIN D) 1000 units tablet Take 1,000 Units by mouth daily.     [provider]  colchicine 0.6 MG tablet Take 0.6 mg by mouth daily.    [provider]  furosemide (LASIX) 40 MG tablet Take 1 tablet (40 mg total) by mouth daily. 04/18/19 07/17/19  Minus Breeding, MD  gabapentin (NEURONTIN) 100 MG capsule Take 1 capsule (100 mg total) by mouth at bedtime. 09/16/19   Ward Givens, NP  guaifenesin (HUMIBID E) 400 MG TABS tablet Take 400 mg by mouth daily.     [provider]   HYDROcodone-acetaminophen (NORCO/VICODIN) 5-325 MG tablet Take 1 tablet by mouth 3 (three) times daily as needed (pain). Patient taking differently: Take 1 tablet by mouth daily as needed for moderate pain (pain).  09/06/17   Kary Kos, MD  ipratropium-albuterol (DUONEB) 0.5-2.5 (3) MG/3ML SOLN Take 3 mLs by nebulization every 6 (six) hours as needed (cough).    [provider]  levETIRAcetam (KEPPRA) 250 MG tablet Take 250 mg by mouth 2 (two) times daily. 08/15/19   [provider]  levothyroxine (SYNTHROID) 175 MCG tablet Take 125 mcg by mouth daily before breakfast.     [provider]  metFORMIN (GLUCOPHAGE-XR) 500 MG 24 hr tablet Take 500 mg by mouth 2 (two) times daily.  01/07/19   [provider]  metoprolol succinate (TOPROL-XL) 50 MG 24 hr tablet Take 1 tablet (50 mg total) by mouth daily.  Take with or immediately following a meal. 02/17/19 06/25/19  Minus Breeding, MD  Multiple Vitamin (MULTIVITAMIN) tablet Take 1 tablet by mouth daily.    [provider]  nitroGLYCERIN (NITROSTAT) 0.4 MG SL tablet Place 0.4 mg every 5 (five) minutes as needed under the tongue for chest pain.    [provider]  oxymetazoline (AFRIN) 0.05 % nasal spray Place 2 sprays into the nose 2 (two) times daily as needed for congestion.     [provider]  pantoprazole (PROTONIX) 40 MG tablet Take 40 mg by mouth every evening.    [provider]  polyethylene glycol powder (MIRALAX) 17 GM/SCOOP powder Take by mouth as directed. 17 gm every other day    [provider]  rizatriptan (MAXALT) 10 MG tablet Take 10 mg by mouth as needed for migraine.  08/08/18   [provider]  simvastatin (ZOCOR) 40 MG tablet Take 40 mg by mouth daily at 6 PM.  08/07/17   [provider]  spironolactone (ALDACTONE) 25 MG tablet 25 mg daily.  02/07/19   [provider]  TRAVATAN Z 0.004 % SOLN ophthalmic solution Place 1 drop into both  eyes at bedtime.  07/25/13   [provider]  TRELEGY ELLIPTA 100-62.5-25 MCG/INH AEPB Inhale 1 puff into the lungs daily.  05/01/18   [provider]  triamcinolone cream (KENALOG) 0.5 % Apply 1 application topically daily as needed for rash. 09/09/19   [provider]  VENTOLIN HFA 108 (90 Base) MCG/ACT inhaler Inhale 1-2 puffs into the lungs every 4 (four) hours as needed for wheezing or shortness of breath.  07/17/18   [provider]     Consultants:      Procedures/Significant Events:  3/31 CT head Wo contrast;No evidence of acute infarction, hemorrhage, hydrocephalus, extra-axial collection or mass lesion/mass effect. Scattered low-density changes within the periventricular and subcortical white matter compatible with chronic microvascular ischemic change. Mild diffuse cerebral volume loss.  3/31 MRI C-spine W/WO contrast; Technically limited exam due to extensive motion artifact. 2. Postoperative changes from prior ACDF at C3-C6. Residual endplate osseous ridging with uncovertebral and facet hypertrophy at these levels, with persistent mild to moderate diffuse spinal stenosis with moderate to severe bilateral foraminal narrowing as above. Overall, appearance is improved relative to preoperative exam from 2019. 3. Patchy cord signal abnormality at the level of C3-4, consistent with chronic myelomalacia.  Alignment: Examination moderately degraded by motion artifact, somewhat limiting assessment.  Straightening of the normal cervical lordosis. Trace 2 mm retrolisthesis of C2 on C3, with 2-3 mm anterolisthesis of C6 on C7 and C7 on T1. Findings chronic and facet mediated.  Vertebrae: Susceptibility artifact from prior ACDF at C3-C6. Vertebral body height maintained without evidence for acute or chronic fracture. Bone marrow signal intensity within normal limits. No visible discrete or worrisome osseous lesions on the on this motion  degraded exam. No abnormal marrow edema or enhancement.  Cord: Probable patchy cord signal abnormality within the cervical spinal cord at the level of C3-4, consistent with chronic myelomalacia related to previously seen compressive stenosis. No other definite cord signal changes on this motion degraded exam. No abnormal enhancement.  Posterior Fossa, vertebral arteries, paraspinal tissues: Visualized brain and posterior fossa within normal limits. Retro cerebellar cyst versus mega cisterna magna noted. Craniocervical junction within normal limits. Paraspinous and prevertebral soft tissues unremarkable. Normal intravascular flow voids seen within the vertebral arteries bilaterally.  Disc levels:  C2-C3: Retrolisthesis. Mild diffuse disc bulge. Right  greater than left facet hypertrophy. No significant spinal stenosis or cord deformity. Moderate left with mild right C3 foraminal stenosis.  C3-C4: Prior fusion. Residual posterior endplate osseous ridging indents the ventral thecal sac with persistent mild to moderate spinal stenosis. Bilateral facet hypertrophy. Persistent moderate to severe right worse than left C4 foraminal narrowing. Appearance is improved relative to previous exam.  C4-C5: Prior fusion. Residual posterior endplate osseous ridging indents the ventral thecal sac with persistent mild spinal stenosis. Moderate bilateral facet hypertrophy. Persistent moderate to severe bilateral foraminal narrowing. Appearance is slightly improved from previous exam.  C5-C6: Prior fusion. Residual central endplate osseous ridging indents the ventral thecal sac, contacting and mildly flattening the cervical spinal cord. Persistent mild to moderate spinal stenosis, improved from previous. Bilateral facet hypertrophy with right greater than left uncinate spurring with resultant moderate to severe right worse than left C6 foraminal narrowing, also improved.  C6-C7:  Anterolisthesis. Mild disc bulge with bilateral uncovertebral hypertrophy. Flattening of the ventral thecal sac with no more than mild spinal stenosis. Superimposed bilateral facet hypertrophy. Moderate bilateral C7 foraminal narrowing, grossly stable.  C7-T1: Anterolisthesis. Negative interspace. Bilateral facet hypertrophy. No significant spinal stenosis. Foramina remain patent.  T1-2: Diffuse disc bulge. Posterior element hypertrophy. Resultant mild spinal stenosis with moderate left worse than right foraminal narrowing, grossly similar.   I have personally reviewed and interpreted all radiology studies and my findings are as above.   VENTILATOR SETTINGS:    Cultures 3/31 SARS coronavirus pending 3/31 urine culture pending 3/31 blood culture pending    Antimicrobials: Anti-infectives (From admission, onward)   Start     Dose/Rate Stop   09/17/19 1700  cefTRIAXone (ROCEPHIN) 1 g in sodium chloride 0.9 % 100 mL IVPB     1 g 200 mL/hr over 30 Minutes 09/17/19 1828       Devices    LINES / TUBES:      Continuous Infusions: . lactated ringers      Physical Exam: Vitals:   09/17/19 1430 09/17/19 1440 09/17/19 1442 09/17/19 1730  BP: 138/65   (!) 141/69  Pulse: 68 80 80 80  Resp: 15 16 16 17   Temp:      TempSrc:      SpO2: 90% 96% 96% 90%  Weight:      Height:        Wt Readings from Last 3 Encounters:  09/17/19 93 kg  09/04/19 92.1 kg  06/25/19 89.8 kg    General: A/O x4, positive chronic respiratory distress (on 2 L O2 via ) Eyes: negative scleral hemorrhage, negative anisocoria, negative icterus ENT: Negative Runny nose, negative gingival bleeding, Neck:  Negative scars, masses, torticollis, lymphadenopathy, JVD Lungs: Clear to auscultation bilaterally without wheezes or crackles Cardiovascular: Regular rate and rhythm without murmur gallop or rub normal S1 and S2 Abdomen: OBESE, negative abdominal pain, nondistended, positive soft, bowel  sounds, no rebound, no ascites, no appreciable mass Extremities: Positive L LE cyanosis, swelling calf with tenderness to palpation.   Skin: Negative rashes, lesions, ulcers Psychiatric:  Negative depression, negative anxiety, negative fatigue, negative mania  Central nervous system:  Cranial nerves II through XII intact, tongue/uvula midline, RIGHT extremities muscle strength 5 /5, LEFT hemiparesis, LEFT decreased sensation L LE, sensation intact throughout remainder of extremities,negative dysarthria, negative expressive aphasia, negative receptive aphasia.        Labs on Admission:  Basic Metabolic Panel: Recent Labs  Lab 09/17/19 1111  NA 138  K 4.8  CL 101  CO2 27  GLUCOSE 109*  BUN 43*  CREATININE 1.64*  CALCIUM 9.3   Liver Function Tests: Recent Labs  Lab 09/17/19 1111  AST 21  ALT 10  ALKPHOS 61  BILITOT 0.6  PROT 7.0  ALBUMIN 3.9   No results for input(s): LIPASE, AMYLASE in the last 168 hours. No results for input(s): AMMONIA in the last 168 hours. CBC: Recent Labs  Lab 09/17/19 1111  WBC 13.1*  NEUTROABS 10.3*  HGB 13.3  HCT 40.5  MCV 91.2  PLT 214   Cardiac Enzymes: No results for input(s): CKTOTAL, CKMB, CKMBINDEX, TROPONINI in the last 168 hours.  BNP (last 3 results) No results for input(s): BNP in the last 8760 hours.  ProBNP (last 3 results) Recent Labs    02/14/19 1001  PROBNP 482    CBG: No results for input(s): GLUCAP in the last 168 hours.  Radiological Exams on Admission: CT Head Wo Contrast  Result Date: 09/17/2019 CLINICAL DATA:  Altered mental status EXAM: CT HEAD WITHOUT CONTRAST TECHNIQUE: Contiguous axial images were obtained from the base of the skull through the vertex without intravenous contrast. COMPARISON:  08/23/2017 FINDINGS: Brain: No evidence of acute infarction, hemorrhage, hydrocephalus, extra-axial collection or mass lesion/mass effect. Scattered low-density changes within the periventricular and subcortical  white matter compatible with chronic microvascular ischemic change. Mild diffuse cerebral volume loss. Vascular: Atherosclerotic calcifications involving the large vessels of the skull base. No unexpected hyperdense vessel. Skull: Normal. Negative for fracture or focal lesion. Sinuses/Orbits: No acute finding. Other: None. IMPRESSION: 1.  No acute intracranial findings. 2.  Chronic microvascular ischemic change and cerebral volume loss. Electronically Signed   By: Davina Poke D.O.   On: 09/17/2019 13:52   MR Cervical Spine W or Wo Contrast  Result Date: 09/17/2019 CLINICAL DATA:  Initial evaluation for generalized weakness, inability to stand without assistance. EXAM: MRI CERVICAL SPINE WITHOUT AND WITH CONTRAST TECHNIQUE: Multiplanar and multiecho pulse sequences of the cervical spine, to include the craniocervical junction and cervicothoracic junction, were obtained without and with intravenous contrast. CONTRAST:  12mL GADAVIST GADOBUTROL 1 MMOL/ML IV SOLN COMPARISON:  Prior MRI from 08/23/2017. FINDINGS: Alignment: Examination moderately degraded by motion artifact, somewhat limiting assessment. Straightening of the normal cervical lordosis. Trace 2 mm retrolisthesis of C2 on C3, with 2-3 mm anterolisthesis of C6 on C7 and C7 on T1. Findings chronic and facet mediated. Vertebrae: Susceptibility artifact from prior ACDF at C3-C6. Vertebral body height maintained without evidence for acute or chronic fracture. Bone marrow signal intensity within normal limits. No visible discrete or worrisome osseous lesions on the on this motion degraded exam. No abnormal marrow edema or enhancement. Cord: Probable patchy cord signal abnormality within the cervical spinal cord at the level of C3-4, consistent with chronic myelomalacia related to previously seen compressive stenosis. No other definite cord signal changes on this motion degraded exam. No abnormal enhancement. Posterior Fossa, vertebral arteries, paraspinal  tissues: Visualized brain and posterior fossa within normal limits. Retro cerebellar cyst versus mega cisterna magna noted. Craniocervical junction within normal limits. Paraspinous and prevertebral soft tissues unremarkable. Normal intravascular flow voids seen within the vertebral arteries bilaterally. Disc levels: C2-C3: Retrolisthesis. Mild diffuse disc bulge. Right greater than left facet hypertrophy. No significant spinal stenosis or cord deformity. Moderate left with mild right C3 foraminal stenosis. C3-C4: Prior fusion. Residual posterior endplate osseous ridging indents the ventral thecal sac with persistent mild to moderate spinal stenosis. Bilateral facet hypertrophy. Persistent moderate to severe right worse than left C4 foraminal narrowing.  Appearance is improved relative to previous exam. C4-C5: Prior fusion. Residual posterior endplate osseous ridging indents the ventral thecal sac with persistent mild spinal stenosis. Moderate bilateral facet hypertrophy. Persistent moderate to severe bilateral foraminal narrowing. Appearance is slightly improved from previous exam. C5-C6: Prior fusion. Residual central endplate osseous ridging indents the ventral thecal sac, contacting and mildly flattening the cervical spinal cord. Persistent mild to moderate spinal stenosis, improved from previous. Bilateral facet hypertrophy with right greater than left uncinate spurring with resultant moderate to severe right worse than left C6 foraminal narrowing, also improved. C6-C7: Anterolisthesis. Mild disc bulge with bilateral uncovertebral hypertrophy. Flattening of the ventral thecal sac with no more than mild spinal stenosis. Superimposed bilateral facet hypertrophy. Moderate bilateral C7 foraminal narrowing, grossly stable. C7-T1: Anterolisthesis. Negative interspace. Bilateral facet hypertrophy. No significant spinal stenosis. Foramina remain patent. T1-2: Diffuse disc bulge. Posterior element hypertrophy. Resultant  mild spinal stenosis with moderate left worse than right foraminal narrowing, grossly similar. IMPRESSION: 1. Technically limited exam due to extensive motion artifact. 2. Postoperative changes from prior ACDF at C3-C6. Residual endplate osseous ridging with uncovertebral and facet hypertrophy at these levels, with persistent mild to moderate diffuse spinal stenosis with moderate to severe bilateral foraminal narrowing as above. Overall, appearance is improved relative to preoperative exam from 2019. 3. Patchy cord signal abnormality at the level of C3-4, consistent with chronic myelomalacia. Electronically Signed   By: Jeannine Boga M.D.   On: 09/17/2019 15:59   DG Chest Port 1 View  Result Date: 09/17/2019 CLINICAL DATA:  Generalized weakness. EXAM: PORTABLE CHEST 1 VIEW COMPARISON:  CT 12/07/2018 FINDINGS: Chronic cardiomegaly and aortic tortuosity. The right chest is clear. Left hemidiaphragm is elevated, versus the possible presence of a sub pulmonic effusion. There is volume loss at the left lung base. IMPRESSION: Elevated left hemidiaphragm with volume loss at the left lung base. Cannot rule a sub pulmonic effusion. Cardiomegaly and aortic atherosclerosis. Electronically Signed   By: Nelson Chimes M.D.   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   Myelopathy concurrent with and due to spinal stenosis of cervical region [facility])   Essential hypertension   PVD (peripheral vascular disease) (HCC)   Chronic diastolic CHF (congestive heart failure) (HCC)   Diabetes mellitus type 2, controlled, with complications ([place])   Obese   Thoracic aortic aneurysm (HCC)   Bowel incontinence    Bladder incontinence   Acute renal failure (ARF) (HCC)   Left hemiparesis (HCC)   Chronic respiratory failure with hypoxia (HCC)  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    Acute on chronic lower extremity weakness. -Per patient usually can ambulate short distance with walker.  Starting today unable to ambulate at all, with increased LLE pain -MRI T-spine/L-spine pending  LLE hypoesthesia -Per patient 3/31 began to have lateral leg loss of sensation.  Bowel/bladder incontinence -Chronic.  Per patient stable   Chronic diastolic CHF? -Per last echocardiogram CHF indeterminate.  However patient on multiple medications consistent with CHF. -3/31 echocardiogram pending -Strict in and out -Daily weight -Monitor closely for fluid overload -Hold  all BP medication patient's BP borderline  Essential HTN -See CHF  THORACIC ascending aortic aneurysm (per past medical history in chart) -Patient states unaware of this condition -08/18/2017 reported as 4.2 cm -MRI of thoracic spine should show any enlargement.  Per guidelines patient should have yearly evaluation.    Chronic respiratory failure with hypoxia -Patient on 2 L O2 at home -Titrate O2 to maintain SPO2> 88% -Albuterol q 4hr as needed -Fluticasone-Umeclidin-Vilant 100-60 2.5-25 mcg/INH daily -DuoNeb QID PRN  OSA/OHS -CPAP per respiratory  Acute renal failure (baseline Cr 0.87) -Normal saline 72ml/hr  DM type II controlled with complication -Hold home medications secondary to acute renal failure -Moderate SSI  L LE DVT? -Patient with left calf swelling and pain to palpation D-dimer pending.  If positive will obtain lower extremity Doppler     Code Status: DNR (DVT Prophylaxis: Lovenox Family Communication:   Disposition Plan: TBD   Data Reviewed: Care during the described time interval was  provided by me .  I have reviewed this patient's available data, including medical history, events of note, physical examination, and all test results as part of my evaluation.   The patient is critically ill with multiple organ systems failure and requires high complexity decision making for assessment and support, frequent evaluation and titration of therapies, application of advanced monitoring technologies and extensive interpretation of multiple databases. Critical Care Time devoted to patient care services described in this note  Time spent: 69 minutes   Debbera Wolken, [facility] Pager [phone]

## 2019-09-17 NOTE — Progress Notes (Signed)
Patient became very agitated. Began yelling during exam , tried to coax pt through exam. Pt started banging his knee on bore of scanner. When we rushed in pt sts he was doing this to get our attention. We had been communicating with pt ; he could not hear [place] though per pt. Pt refused to continue exam. Thomasena Edis to speak with RN upon return to ER. Pt moved a lot but tolerated exam fine on earlier exam today (c spine done around 1500hrs) This was not expected.

## 2019-09-17 NOTE — ED Notes (Signed)
Patient transported to MRI at 2138 by radiology tech.

## 2019-09-17 NOTE — ED Notes (Signed)
Patient transported to CT 

## 2019-09-17 NOTE — Telephone Encounter (Signed)
I called wife.  Pt in ED now.  Cannot walk, very weak.  She stated that they feel like he is dehydrated, increased WBC. Have done some labs, still in work up phase.  Gabapentin not work, legs jumping L now  Too.  Not sleeping at all.  Will let MM/NP re: appt, see MM or CD/MD.

## 2019-09-17 NOTE — ED Triage Notes (Signed)
Pt BIBA from home.   Per EMS- Pt c/o generalized weakness, difficulty performing ADL's.  Pt denies pain, SHOB.   AOx4. Pt unable to stand without assistance today.  Pt able to stand/pivot independently at baseline.

## 2019-09-17 NOTE — ED Provider Notes (Signed)
[facility] DEPT Provider Note   CSN: [id number] Arrival date & time: 09/17/19  1049     History Chief Complaint  Patient presents with  . Weakness    [name] is a 79 y.o. male.  79 yo M with a chief complaints of generalized fatigue.  This been going on for a couple days.  Got to the point where he is having trouble getting around his house.  Denies any fall or injury.  His chronic low back pain with radiation down the left leg that he does not think is significantly changed.  Denies cough denies congestion denies fever.  Denies loss of bowel or bladder denies loss peritoneal sensation.  Has chronic numbness and weakness to his left leg that is unchanged.  Denies recent head injury.  Denies dark stool or blood in his stool.  Thinks he has been eating and drinking normally.  The history is provided by the patient.  Weakness Severity:  Moderate Onset quality:  Gradual Duration:  2 days Timing:  Constant Progression:  Worsening Chronicity:  New Relieved by:  Nothing Worsened by:  Nothing Ineffective treatments:  None tried Associated symptoms: cough (with copd, unchanged from baseline)   Associated symptoms: no abdominal pain, no arthralgias, no chest pain, no diarrhea, no fever, no headaches, no myalgias, no shortness of breath and no vomiting        Past Medical History:  Diagnosis Date  . Arthritis   . Barrett esophagus   . Bronchitis   . Carotid artery occlusion   . COPD (chronic obstructive pulmonary disease) ([place])   . Diabetes mellitus without complication ([place])   . GERD (gastroesophageal reflux disease)   . Headache    migraines  . History of thyroid cancer 2008  . Hypertension   . Restless leg   . Sleep apnea with use of continuous positive airway pressure (CPAP)    2011 [facility] , AHI  77cn central and obstrcutive. 16 cm water , 3 cm EPR.   . Thoracic ascending aortic aneurysm (HCC)    4.2 cm ascending TAA 08/18/17 CTA.  Annual imaging recommended.  . thyroid ca dx'd 2009 or 2010   surg and radioactive isoptope  . Ulcer    Peptic ulcer disease    Patient Active Problem List   Diagnosis Date Noted  . Diabetes mellitus type 2, controlled, with complications ([place]) [id number]  . Obese 09/17/2019  . Thoracic aortic aneurysm ([place]) 09/17/2019  . Bowel incontinence 09/17/2019  . Bladder incontinence 09/17/2019  . Right hemiparesis ([place]) 09/17/2019  . Acute renal failure (ARF) ([place]) 09/17/2019  . Left hemiparesis ([place]) 09/17/2019  . Chronic respiratory failure with hypoxia ([place]) 09/17/2019  . Syncope and collapse 09/17/2019  . Chronic diastolic CHF (congestive heart failure) ([place]) 09/04/2019  . PVD (peripheral vascular disease) ([place]) 02/14/2019  . Essential hypertension 02/13/2019  . Non-insulin treated type 2 diabetes mellitus ([place]) 02/13/2019  . Severe sepsis ([place]) 01/17/2019  . Community acquired pneumonia of right lower lobe of lung 01/17/2019  . Postural urinary incontinence 05/20/2018  . 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 Myoclonic jerking 05/20/2018  . 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 History of respiratory failure 08/18/2017  . Hypokalemia 08/18/2017  . Leukocytosis 08/18/2017  . Muscle atrophy of lower extremity 08/09/2017  . Bowel and bladder incontinence 08/09/2017  . Right-sided muscle weakness 08/09/2017  . Neuropathy 08/09/2017  . Neurogenic  claudication due to lumbar spinal stenosis 08/09/2017  . Abnormality of gait 07/12/2017  . Myoclonic jerkings, 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/2017  . Acquired right foot drop 07/12/2017  . UTI (urinary tract infection) due to Enterococcus 07/12/2017  . Pressure injury of skin 06/21/2017  . Acute cystitis 06/20/2017  . Generalized weakness 06/20/2017  . Dehydration 06/20/2017  . Dyspnea 12/26/2016  . Chronic cough 12/26/2016  . Hypersomnia with sleep apnea  10/30/2016  . CSA (central sleep apnea) 10/30/2016  . Wheezing symptom 10/30/2016  . 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 RLS (restless legs syndrome) 08/25/2014  . Primary gout 08/25/2014  . OSA on CPAP 08/25/2014  . Severe obesity (BMI >= 40) ([place]) 08/25/2014  . Obesity (BMI 30-39.9) 02/18/2013  . 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 S/P TKR (total knee replacement) 11/29/2012  . History of thyroid cancer     Past Surgical History:  Procedure Laterality Date  . ANTERIOR CERVICAL DECOMP/DISCECTOMY FUSION N/A 09/03/2017   Procedure: ACDF - C3-C4 - C4-C5 - C5-C6;  Surgeon: Kary Kos, MD;  Location: [facility];  Service: Neurosurgery;  Laterality: N/A;  . CAROTID ENDARTERECTOMY Left January 03, 2006   Dr. Amedeo Plenty  . COLONOSCOPY    . EYE SURGERY Bilateral    cataract surgery with lens implants  . game keepers thumb Right   . JOINT REPLACEMENT Left    knee X 2  . ROTATOR CUFF REPAIR Right   . THYROIDECTOMY  2008       Family History  Problem Relation Age of Onset  . COPD Mother   . Hyperlipidemia Mother   . Hypertension Mother   . Diabetes Father   . Kidney disease Father        ESRD  . Hypertension Father   . Cancer Father   . Hyperlipidemia Father     Social History   Tobacco Use  . Smoking status: Former Smoker    Years: 40.00    Types: Cigars, Cigarettes    Quit date: 06/19/2005    Years since quitting: 14.2  . Smokeless tobacco: Never Used  . Tobacco comment: smoked 4-6 cigars daily, only smoked cigarettes socially  Substance Use Topics  . Alcohol use: No  . Drug use: No    Home Medications Prior to Admission medications   Medication Sig Start Date End Date Taking? Authorizing Provider  acetaminophen (TYLENOL) 325 MG tablet Take 2 tablets (650 mg total) by mouth every 4 (four) hours as needed for mild pain ((score 1 to 3) or temp > 100.5). 09/06/17  Yes Kary Kos, MD  allopurinol (ZYLOPRIM) 300 MG  tablet Take 300 mg by mouth daily.  06/02/15  Yes [provider]  baclofen (LIORESAL) 10 MG tablet TAKE 1 TABLET BY MOUTH 3 TIMES DAILY. Patient taking differently: Take 10 mg by mouth 3 (three) times daily.  06/23/19  Yes Ward Givens, NP  calcium citrate-vitamin D (CITRACAL+D) 315-200 MG-UNIT per tablet Take 1 tablet by mouth daily. 750 mg    Yes [provider]  carbidopa-levodopa (SINEMET IR) 10-100 MG tablet Take 1 tablet by mouth 2 (two) times daily.    Yes [provider]  cholecalciferol (VITAMIN D) 1000 units tablet Take 1,000 Units by mouth daily.    Yes [provider]  colchicine 0.6 MG tablet Take 0.6 mg by mouth daily.   Yes [provider]  furosemide (LASIX) 40 MG tablet Take 1 tablet (40 mg total) by mouth  daily. 04/18/19 09/18/19 Yes Minus Breeding, MD  gabapentin (NEURONTIN) 100 MG capsule Take 1 capsule (100 mg total) by mouth at bedtime. 09/16/19  Yes Ward Givens, NP  guaifenesin (HUMIBID E) 400 MG TABS tablet Take 400 mg by mouth daily.    Yes [provider]  HYDROcodone-acetaminophen (NORCO/VICODIN) 5-325 MG tablet Take 1 tablet by mouth 3 (three) times daily as needed (pain). Patient taking differently: Take 1 tablet by mouth daily as needed for moderate pain (pain).  09/06/17  Yes Kary Kos, MD  ipratropium-albuterol (DUONEB) 0.5-2.5 (3) MG/3ML SOLN Take 3 mLs by nebulization every 6 (six) hours as needed (cough).   Yes [provider]  levETIRAcetam (KEPPRA) 250 MG tablet Take 250 mg by mouth 2 (two) times daily. 08/15/19  Yes [provider]  levothyroxine (SYNTHROID) 175 MCG tablet Take 125 mcg by mouth daily before breakfast.    Yes [provider]  metFORMIN (GLUCOPHAGE-XR) 500 MG 24 hr tablet Take 500 mg by mouth 2 (two) times daily.  01/07/19  Yes [provider]  metoprolol succinate (TOPROL-XL) 50 MG 24 hr tablet Take 1 tablet (50 mg total) by mouth daily. Take with or  immediately following a meal. 02/17/19 09/18/19 Yes Hochrein, Jeneen Rinks, MD  Multiple Vitamin (MULTIVITAMIN) tablet Take 1 tablet by mouth daily.   Yes [provider]  nitroGLYCERIN (NITROSTAT) 0.4 MG SL tablet Place 0.4 mg every 5 (five) minutes as needed under the tongue for chest pain.   Yes [provider]  oxymetazoline (AFRIN) 0.05 % nasal spray Place 2 sprays into the nose 2 (two) times daily as needed for congestion.    Yes [provider]  pantoprazole (PROTONIX) 40 MG tablet Take 40 mg by mouth every evening.   Yes [provider]  polyethylene glycol powder (MIRALAX) 17 GM/SCOOP powder Take by mouth as directed. 17 gm every other day    Yes [provider]  rizatriptan (MAXALT) 10 MG tablet Take 10 mg by mouth as needed for migraine.  08/08/18  Yes [provider]  simvastatin (ZOCOR) 40 MG tablet Take 40 mg by mouth daily at 6 PM.  08/07/17  Yes [provider]  spironolactone (ALDACTONE) 25 MG tablet Take 25 mg by mouth daily.  02/07/19  Yes [provider]  TRAVATAN Z 0.004 % SOLN ophthalmic solution Place 1 drop into both eyes at bedtime.  07/25/13  Yes [provider]  TRELEGY ELLIPTA 100-62.5-25 MCG/INH AEPB Inhale 1 puff into the lungs daily.  05/01/18  Yes [provider]  triamcinolone cream (KENALOG) 0.5 % Apply 1 application topically daily as needed for rash. 09/09/19  Yes [provider]  VENTOLIN HFA 108 (90 Base) MCG/ACT inhaler Inhale 1-2 puffs into the lungs every 4 (four) hours as needed for wheezing or shortness of breath.  07/17/18  Yes [provider]    Allergies    Patient has no known allergies.  Review of Systems   Review of Systems  Constitutional: Negative for chills and fever.  HENT: Negative for congestion and facial swelling.   Eyes: Negative for discharge and visual disturbance.  Respiratory: Positive for cough (with copd, unchanged from baseline). Negative  for shortness of breath.   Cardiovascular: Negative for chest pain and palpitations.  Gastrointestinal: Negative for abdominal pain, diarrhea and vomiting.  Musculoskeletal: Positive for back pain. Negative for arthralgias and myalgias.  Skin: Negative for color change and rash.  Neurological: Positive for weakness. Negative for tremors, syncope and headaches.  Psychiatric/Behavioral: Negative for confusion and dysphoric mood.    Physical Exam Updated Vital Signs BP 120/66 (BP Location: Left Arm)   Pulse 88   Temp 98.2 F (36.8 C) (Oral)   Resp 14   Ht 5\' 7"  (1.702 m)   Wt 93 kg   SpO2 93%   BMI 32.11 kg/m   Physical Exam Vitals and nursing note reviewed.  Constitutional:      Appearance: He is well-developed.  HENT:     Head: Normocephalic and atraumatic.  Eyes:     Pupils: Pupils are equal, round, and reactive to light.  Neck:     Vascular: No JVD.  Cardiovascular:     Rate and Rhythm: Normal rate and regular rhythm.     Heart sounds: No murmur. No friction rub. No gallop.   Pulmonary:     Effort: No respiratory distress.     Breath sounds: No wheezing.     Comments: Prolonged expiration.  Faint scattered end expiratory wheezes Abdominal:     General: There is no distension.     Tenderness: There is no guarding or rebound.  Musculoskeletal:        General: Normal range of motion.     Cervical back: Normal range of motion and neck supple.  Skin:    Coloration: Skin is not pale.     Findings: No rash.  Neurological:     Mental Status: He is alert and oriented to person, place, and time.     Comments: Benign neurologic exam with the exception of mild weakness diffusely.  Psychiatric:        Behavior: Behavior normal.     ED Results / Procedures / Treatments   Labs (all labs ordered are listed, but only abnormal results are displayed) Labs Reviewed  URINALYSIS, ROUTINE W REFLEX MICROSCOPIC - Abnormal; Notable for the following components:      Result Value    Color, Urine STRAW (*)    Leukocytes,Ua LARGE (*)    WBC, UA >50 (*)    All other components within normal limits  COMPREHENSIVE METABOLIC PANEL - Abnormal; Notable for the following components:   Glucose, Bld 109 (*)    BUN 43 (*)    Creatinine, Ser 1.64 (*)    GFR calc non Af Amer 39 (*)    GFR calc Af Amer 46 (*)    All other components within normal limits  CBC WITH DIFFERENTIAL/PLATELET - Abnormal; Notable for the following components:   WBC 13.1 (*)    Neutro Abs 10.3 (*)    Monocytes Absolute 1.2 (*)    All other components within normal limits  TSH - Abnormal; Notable for the following components:   TSH 0.280 (*)    All other components within normal limits  T4, FREE - Abnormal; Notable for the following components:   Free T4 1.23 (*)    All other components within normal limits  C-REACTIVE PROTEIN - Abnormal; Notable for the following components:   CRP 1.7 (*)    All other components within normal limits  CBC - Abnormal; Notable for the following components:   RBC 4.18 (*)    Hemoglobin 12.6 (*)    HCT 38.2 (*)    All other components within normal limits  CREATININE, SERUM - Abnormal; Notable for the following components:   Creatinine, Ser 1.26 (*)    GFR calc non Af Amer 54 (*)    All other components within normal limits  COMPREHENSIVE METABOLIC PANEL -  Abnormal; Notable for the following components:   BUN 26 (*)    GFR calc non Af Amer 59 (*)    All other components within normal limits  TSH - Abnormal; Notable for the following components:   TSH 0.223 (*)    All other components within normal limits  HEMOGLOBIN A1C - Abnormal; Notable for the following components:   Hgb A1c MFr Bld 5.8 (*)    All other components within normal limits  GLUCOSE, CAPILLARY - Abnormal; Notable for the following components:   Glucose-Capillary 101 (*)    All other components within normal limits  CBG MONITORING, ED - Abnormal; Notable for the following components:    Glucose-Capillary 109 (*)    All other components within normal limits  CBG MONITORING, ED - Abnormal; Notable for the following components:   Glucose-Capillary 104 (*)    All other components within normal limits  CBG MONITORING, ED - Abnormal; Notable for the following components:   Glucose-Capillary 103 (*)    All other components within normal limits  SARS CORONAVIRUS 2 (TAT 6-24 HRS)  CULTURE, BLOOD (ROUTINE X 2)  CULTURE, BLOOD (ROUTINE X 2)  URINE CULTURE  SEDIMENTATION RATE  D-DIMER, QUANTITATIVE (NOT AT [facility])  MAGNESIUM  PHOSPHORUS  GLUCOSE, CAPILLARY  CBG MONITORING, ED  TROPONIN I (HIGH SENSITIVITY)    EKG EKG Interpretation  Date/Time:  Wednesday September 17 2019 18:03:59 EDT Ventricular Rate:  76 PR Interval:    QRS Duration: 130 QT Interval:  442 QTC Calculation: 497 R Axis:   -69 Text Interpretation: Normal sinus rhythm Repeat Confirmed by Pattricia Boss [phone]) on 09/18/2019 11:16:18 AM   Radiology CT Head Wo Contrast  Result Date: 09/17/2019 CLINICAL DATA:  Altered mental status EXAM: CT HEAD WITHOUT CONTRAST TECHNIQUE: Contiguous axial images were obtained from the base of the skull through the vertex without intravenous contrast. COMPARISON:  08/23/2017 FINDINGS: Brain: No evidence of acute infarction, hemorrhage, hydrocephalus, extra-axial collection or mass lesion/mass effect. Scattered low-density changes within the periventricular and subcortical white matter compatible with chronic microvascular ischemic change. Mild diffuse cerebral volume loss. Vascular: Atherosclerotic calcifications involving the large vessels of the skull base. No unexpected hyperdense vessel. Skull: Normal. Negative for fracture or focal lesion. Sinuses/Orbits: No acute finding. Other: None. IMPRESSION: 1.  No acute intracranial findings. 2.  Chronic microvascular ischemic change and cerebral volume loss. Electronically Signed   By: Davina Poke D.O.   On: 09/17/2019 13:52   MR  THORACIC SPINE WO CONTRAST  Result Date: 09/17/2019 CLINICAL DATA:  Mid back pain EXAM: MRI THORACIC SPINE WITHOUT CONTRAST TECHNIQUE: Multiplanar, multisequence MR imaging of the thoracic spine was performed. No intravenous contrast was administered. COMPARISON:  None. FINDINGS: The examination is degraded by motion. Axial gradient echo images were not obtained. Alignment: Physiologic. Vertebrae: No fracture, evidence of discitis, or bone lesion. Cord: There is an area of hyperintense T2-weighted signal within the lower thoracic spinal cord. The spinal cord is otherwise poorly evaluated due to motion. Paraspinal and other soft tissues: Small left pleural effusion. Disc levels: There is degenerative disc disease at multiple lower thoracic levels without high-grade spinal canal stenosis. IMPRESSION: 1. Limited examination due to motion artifact and lack of IV contrast. 2. There is an area of hyperintense T2-weighted signal within the lower thoracic spinal cord. This is incompletely evaluated due to motion artifact and lack of IV contrast. Given the limitations of the study, a repeat examination with sedation or anesthesia might be helpful. 3. Multilevel degenerative disc  disease of the lower thoracic spine without high-grade spinal canal stenosis. Electronically Signed   By: Ulyses Jarred M.D.   On: 09/17/2019 22:35   MR Cervical Spine W or Wo Contrast  Result Date: 09/17/2019 CLINICAL DATA:  Initial evaluation for generalized weakness, inability to stand without assistance. EXAM: MRI CERVICAL SPINE WITHOUT AND WITH CONTRAST TECHNIQUE: Multiplanar and multiecho pulse sequences of the cervical spine, to include the craniocervical junction and cervicothoracic junction, were obtained without and with intravenous contrast. CONTRAST:  60mL GADAVIST GADOBUTROL 1 MMOL/ML IV SOLN COMPARISON:  Prior MRI from 08/23/2017. FINDINGS: Alignment: Examination moderately degraded by motion artifact, somewhat limiting  assessment. Straightening of the normal cervical lordosis. Trace 2 mm retrolisthesis of C2 on C3, with 2-3 mm anterolisthesis of C6 on C7 and C7 on T1. Findings chronic and facet mediated. Vertebrae: Susceptibility artifact from prior ACDF at C3-C6. Vertebral body height maintained without evidence for acute or chronic fracture. Bone marrow signal intensity within normal limits. No visible discrete or worrisome osseous lesions on the on this motion degraded exam. No abnormal marrow edema or enhancement. Cord: Probable patchy cord signal abnormality within the cervical spinal cord at the level of C3-4, consistent with chronic myelomalacia related to previously seen compressive stenosis. No other definite cord signal changes on this motion degraded exam. No abnormal enhancement. Posterior Fossa, vertebral arteries, paraspinal tissues: Visualized brain and posterior fossa within normal limits. Retro cerebellar cyst versus mega cisterna magna noted. Craniocervical junction within normal limits. Paraspinous and prevertebral soft tissues unremarkable. Normal intravascular flow voids seen within the vertebral arteries bilaterally. Disc levels: C2-C3: Retrolisthesis. Mild diffuse disc bulge. Right greater than left facet hypertrophy. No significant spinal stenosis or cord deformity. Moderate left with mild right C3 foraminal stenosis. C3-C4: Prior fusion. Residual posterior endplate osseous ridging indents the ventral thecal sac with persistent mild to moderate spinal stenosis. Bilateral facet hypertrophy. Persistent moderate to severe right worse than left C4 foraminal narrowing. Appearance is improved relative to previous exam. C4-C5: Prior fusion. Residual posterior endplate osseous ridging indents the ventral thecal sac with persistent mild spinal stenosis. Moderate bilateral facet hypertrophy. Persistent moderate to severe bilateral foraminal narrowing. Appearance is slightly improved from previous exam. C5-C6: Prior  fusion. Residual central endplate osseous ridging indents the ventral thecal sac, contacting and mildly flattening the cervical spinal cord. Persistent mild to moderate spinal stenosis, improved from previous. Bilateral facet hypertrophy with right greater than left uncinate spurring with resultant moderate to severe right worse than left C6 foraminal narrowing, also improved. C6-C7: Anterolisthesis. Mild disc bulge with bilateral uncovertebral hypertrophy. Flattening of the ventral thecal sac with no more than mild spinal stenosis. Superimposed bilateral facet hypertrophy. Moderate bilateral C7 foraminal narrowing, grossly stable. C7-T1: Anterolisthesis. Negative interspace. Bilateral facet hypertrophy. No significant spinal stenosis. Foramina remain patent. T1-2: Diffuse disc bulge. Posterior element hypertrophy. Resultant mild spinal stenosis with moderate left worse than right foraminal narrowing, grossly similar. IMPRESSION: 1. Technically limited exam due to extensive motion artifact. 2. Postoperative changes from prior ACDF at C3-C6. Residual endplate osseous ridging with uncovertebral and facet hypertrophy at these levels, with persistent mild to moderate diffuse spinal stenosis with moderate to severe bilateral foraminal narrowing as above. Overall, appearance is improved relative to preoperative exam from 2019. 3. Patchy cord signal abnormality at the level of C3-4, consistent with chronic myelomalacia. Electronically Signed   By: Jeannine Boga M.D.   On: 09/17/2019 15:59   MR Lumbar Spine W Wo Contrast  Result Date: 09/18/2019 CLINICAL DATA:  Weakness and difficulty standing. Low back pain. EXAM: MRI LUMBAR SPINE WITHOUT AND WITH CONTRAST TECHNIQUE: Multiplanar and multiecho pulse sequences of the lumbar spine were obtained without and with intravenous contrast. CONTRAST:  77mL GADAVIST GADOBUTROL 1 MMOL/ML IV SOLN COMPARISON:  07/24/2017 FINDINGS: Despite efforts by the technologist and patient,  marked motion artifact is present on today's exam and could not be eliminated. This reduces exam sensitivity and specificity. Segmentation: The lowest lumbar type non-rib-bearing vertebra is labeled as L5. Alignment: 4 mm degenerative retrolisthesis at L2-3 and L3-4 as on prior exams. Vertebrae: Type 2 degenerative endplate findings at [id number] and L4-5 with type 1 degenerative endplate findings at [id number]. Disc desiccation and loss of disc height is present at all lumbar levels with scattered Schmorl's nodes. 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 Conus medullaris and cauda equina: Conus extends to the L1 level. There is no expansion of the conus. Questionable mild accentuated central signal in the distal thoracic cord, as suggested on prior thoracic spine MRI, although assessment is again limited by motion artifact. No appreciable abnormal enhancement of the cauda equina to suggest arachnoiditis. Paraspinal and other soft tissues: Bilateral renal fluid signal intensity lesions favor cysts. Disc levels: T12-L1: Mild displacement of the right T12 nerve in the lateral extraforaminal space due to right lateral extraforaminal disc protrusion. Right lateral recess disc protrusion is also noted. Roughly stable from prior. L1-2: Moderate central narrowing of the thecal sac due to disc bulge and facet arthropathy. AP diameter of the thecal sac is 0.7 cm. Minimally worsened from prior. L2-3: Prominent central narrowing of the thecal sac with borderline left foraminal stenosis and likely mild bilateral subarticular lateral recess stenosis due to disc bulge, intervertebral spurring, and facet arthropathy. AP diameter of the thecal sac is 0.5 cm. Minimally worsened from prior. L3-4: Prominent central narrowing of the thecal sac with moderate to prominent right and mild left foraminal stenosis and right greater than left subarticular lateral recess stenosis due to intervertebral spurring, disc bulge, and facet  arthropathy. AP diameter the thecal sac is 0.6 cm. Essentially stable from prior. L4-5: Moderate right and mild left foraminal stenosis with moderate central narrowing of the thecal sac due to right greater than left facet arthropathy, intervertebral spurring, and disc bulge. Appearance is stable from prior. L5-S1: Mild right and borderline left foraminal stenosis due to intervertebral and facet spurring along with mild disc bulge. Appearance is stable from prior. IMPRESSION: 1. Lumbar spondylosis, degenerative disc disease, and congenitally short pedicles causing prominent impingement at L2-3 and L3-4; moderate impingement at L1-2 and L4-5; and mild impingement at T12-L1 and L5-S1, as detailed above. Impingement at L1-2 and L2-3 is minimally worsened from the prior exam of 07/24/2017. 2. Questionable accentuated central T2 signal in the distal thoracic cord, as suggested on prior thoracic spine MRI, although assessment is limited by motion artifact. Electronically Signed   By: Van Clines M.D.   On: 09/18/2019 12:00   DG Chest Port 1 View  Result Date: 09/17/2019 CLINICAL DATA:  Generalized weakness. EXAM: PORTABLE CHEST 1 VIEW COMPARISON:  CT 12/07/2018 FINDINGS: Chronic cardiomegaly and aortic tortuosity. The right chest is clear. Left hemidiaphragm is elevated, versus the possible presence of a sub pulmonic effusion. There is volume loss at the left lung base. IMPRESSION: Elevated left hemidiaphragm with volume loss at the left lung base. Cannot rule a sub pulmonic effusion. Cardiomegaly and aortic atherosclerosis. Electronically Signed   By: Nelson Chimes M.D.   On: 09/17/2019  11:54   ECHOCARDIOGRAM COMPLETE  Result Date: 09/18/2019    ECHOCARDIOGRAM REPORT   Patient Name:   [name] [facility] Date of Exam: 09/18/2019 Medical Rec #:  [id number]    Height:       67.0 in Accession #:    [id number]   Weight:       205.0 lb Date of Birth:  06-03-1941    BSA:          2.044 m Patient Age:    58 years     BP:            153/86 mmHg Patient Gender: M            HR:           69 bpm. Exam Location:  Inpatient Procedure: 2D Echo, Cardiac Doppler and Color Doppler Indications:    R55 Syncope  History:        Patient has prior history of Echocardiogram examinations, most                 recent 01/19/2019. PAD and COPD; Risk Factors:Hypertension,                 Diabetes, Sleep Apnea and GERD. Thoracic Aortic Aneurysm.  Sonographer:    Jonelle Sidle Dance Referring Phys: [place]:[id number] [place]  1. Normal LV systolic function; mild LVH; mild LAE; mildly dilated aortic root; mild AI.  2. Left ventricular ejection fraction, by estimation, is 55 to 60%. The left ventricle has normal function. The left ventricle has no regional wall motion abnormalities. There is mild left ventricular hypertrophy. Left ventricular diastolic parameters were normal.  3. Right ventricular systolic function is normal. The right ventricular size is normal.  4. Left atrial size was mildly dilated.  5. The mitral valve is normal in structure. No evidence of mitral valve regurgitation. No evidence of mitral stenosis.  6. The aortic valve is tricuspid. Aortic valve regurgitation is mild. Mild to moderate aortic valve sclerosis/calcification is present, without any evidence of aortic stenosis.  7. Aortic dilatation noted. There is mild dilatation of the aortic root measuring 40 mm.  8. The inferior vena cava is normal in size with greater than 50% respiratory variability, suggesting right atrial pressure of 3 mmHg. FINDINGS  Left Ventricle: Left ventricular ejection fraction, by estimation, is 55 to 60%. The left ventricle has normal function. The left ventricle has no regional wall motion abnormalities. The left ventricular internal cavity size was normal in size. There is  mild left ventricular hypertrophy. Left ventricular diastolic parameters were normal. Right Ventricle: The right ventricular size is normal. Right ventricular systolic function is normal.  Left Atrium: Left atrial size was mildly dilated. Right Atrium: Right atrial size was normal in size. Pericardium: There is no evidence of pericardial effusion. Mitral Valve: The mitral valve is normal in structure. Normal mobility of the mitral valve leaflets. Moderate mitral annular calcification. No evidence of mitral valve regurgitation. No evidence of mitral valve stenosis. Tricuspid Valve: The tricuspid valve is normal in structure. Tricuspid valve regurgitation is trivial. No evidence of tricuspid stenosis. Aortic Valve: The aortic valve is tricuspid. Aortic valve regurgitation is mild. Aortic regurgitation PHT measures 427 msec. Mild to moderate aortic valve sclerosis/calcification is present, without any evidence of aortic stenosis. Pulmonic Valve: The pulmonic valve was not well visualized. Pulmonic valve regurgitation is not visualized. No evidence of pulmonic stenosis. Aorta: Aortic dilatation noted. There is mild dilatation of the aortic root measuring 40  mm. Venous: The inferior vena cava is normal in size with greater than 50% respiratory variability, suggesting right atrial pressure of 3 mmHg.  Additional Comments: Normal LV systolic function; mild LVH; mild LAE; mildly dilated aortic root; mild AI.  LEFT VENTRICLE PLAX 2D LVIDd:         5.20 cm  Diastology LVIDs:         4.10 cm  LV e' lateral:   10.30 cm/s LV PW:         1.40 cm  LV E/e' lateral: 9.7 LV IVS:        1.00 cm  LV e' medial:    8.49 cm/s LVOT diam:     2.30 cm  LV E/e' medial:  11.7 LV SV:         89 LV SV Index:   44 LVOT Area:     4.15 cm  RIGHT VENTRICLE             IVC RV Basal diam:  2.90 cm     IVC diam: 1.30 cm RV S prime:     12.70 cm/s TAPSE (M-mode): 2.2 cm LEFT ATRIUM             Index       RIGHT ATRIUM           Index LA diam:        4.30 cm 2.10 cm/m  RA Area:     15.90 cm LA Vol (A2C):   86.6 ml 42.38 ml/m RA Volume:   33.10 ml  16.20 ml/m LA Vol (A4C):   62.9 ml 30.78 ml/m LA Biplane Vol: 76.6 ml 37.48 ml/m   AORTIC VALVE LVOT Vmax:   119.00 cm/s LVOT Vmean:  71.000 cm/s LVOT VTI:    0.214 m AI PHT:      427 msec  AORTA Ao Root diam: 4.10 cm Ao Asc diam:  3.80 cm MITRAL VALVE MV Area (PHT): 4.39 cm    SHUNTS MV Decel Time: 173 msec    Systemic VTI:  0.21 m MV E velocity: 99.40 cm/s  Systemic Diam: 2.30 cm MV A velocity: 94.70 cm/s MV E/A ratio:  1.05 Kirk Ruths MD Electronically signed by Kirk Ruths MD Signature Date/Time: 09/18/2019/12:58:27 PM    Final     Procedures Procedures (including critical care time)  Medications Ordered in ED Medications  morphine 4 MG/ML injection 4 mg (0 mg Intravenous Hold 09/17/19 1751)  ondansetron (ZOFRAN) injection 4 mg (0 mg Intravenous Hold 09/17/19 1751)  allopurinol (ZYLOPRIM) tablet 300 mg (300 mg Oral Given 09/18/19 1013)  baclofen (LIORESAL) tablet 10 mg (10 mg Oral Given 09/18/19 1009)  carbidopa-levodopa (SINEMET IR) 10-100 MG per tablet immediate release 1 tablet (1 tablet Oral Given 09/18/19 1009)  colchicine tablet 0.6 mg (0.6 mg Oral Given 09/18/19 1013)  gabapentin (NEURONTIN) capsule 100 mg (100 mg Oral Not Given 09/18/19 0046)  HYDROcodone-acetaminophen (NORCO/VICODIN) 5-325 MG per tablet 1 tablet (has no administration in time range)  ipratropium-albuterol (DUONEB) 0.5-2.5 (3) MG/3ML nebulizer solution 3 mL (has no administration in time range)  levETIRAcetam (KEPPRA) tablet 250 mg (250 mg Oral Given 09/18/19 1013)  levothyroxine (SYNTHROID) tablet 125 mcg (125 mcg Oral Given 09/18/19 0630)  multivitamin with minerals tablet 1 tablet (1 tablet Oral Given 09/18/19 1010)  nitroGLYCERIN (NITROSTAT) SL tablet 0.4 mg (has no administration in time range)  pantoprazole (PROTONIX) EC tablet 40 mg (40 mg Oral Not Given 09/18/19 0046)  simvastatin (ZOCOR) tablet 40 mg (has  no administration in time range)  latanoprost (XALATAN) 0.005 % ophthalmic solution 1 drop (1 drop Both Eyes Not Given 09/18/19 0046)  albuterol (PROVENTIL) (2.5 MG/3ML) 0.083% nebulizer solution 2.5  mg (has no administration in time range)  enoxaparin (LOVENOX) injection 40 mg (40 mg Subcutaneous Refused 09/18/19 1029)  aspirin EC tablet 81 mg (81 mg Oral Given 09/18/19 1013)  0.9 %  sodium chloride infusion ( Intravenous Rate/Dose Verify 09/18/19 1500)  insulin aspart (novoLOG) injection 0-15 Units (0 Units Subcutaneous Not Given 09/18/19 1238)  LORazepam (ATIVAN) injection 2 mg (2 mg Intravenous Not Given 09/17/19 2152)  LORazepam (ATIVAN) 2 MG/ML injection (  Not Given 09/17/19 2152)  fluticasone furoate-vilanterol (BREO ELLIPTA) 200-25 MCG/INH 1 puff (1 puff Inhalation Not Given 09/18/19 0921)  umeclidinium bromide (INCRUSE ELLIPTA) 62.5 MCG/INH 1 puff (1 puff Inhalation Not Given 09/18/19 0838)  morphine 2 MG/ML injection 2 mg (2 mg Intravenous Given 09/18/19 1059)  sodium chloride 0.9 % bolus 1,000 mL (0 mLs Intravenous Stopped 09/17/19 1305)  gadobutrol (GADAVIST) 1 MMOL/ML injection 10 mL (10 mLs Intravenous Contrast Given 09/17/19 1506)  cefTRIAXone (ROCEPHIN) 1 g in sodium chloride 0.9 % 100 mL IVPB (0 g Intravenous Stopped 09/17/19 1838)  LORazepam (ATIVAN) injection 2-4 mg (2 mg Intravenous Given 09/17/19 2136)  LORazepam (ATIVAN) injection 4 mg (4 mg Intravenous Given 09/18/19 1059)  gadobutrol (GADAVIST) 1 MMOL/ML injection 9 mL (9 mLs Intravenous Contrast Given 09/18/19 1143)    ED Course  I have reviewed the triage vital signs and the nursing notes.  Pertinent labs & imaging results that were available during my care of the patient were reviewed by me and considered in my medical decision making (see chart for details).    MDM Rules/Calculators/A&P                      79 yo M with a chief complaints of generalized weakness.  Going on for the past few days.  Will obtain a laboratory evaluation.  Bolus of IV fluids.  Reassess.  Patient's chest x-ray without obvious focal infiltrate for me.  Also not having a cough.  UA does have large leuks and and too numerous to count white cells but no  bacteria.  No urinary symptoms.  Patient's wife has arrived and provides more history.  States that he has been having some worsening spasms which has been keeping him from sleeping.  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  Will obtain a MRI of the C-spine.  Signed out to Dr. Laverta Baltimore please see their note for further details of care in the ED.  The patients results and plan were reviewed and discussed.   Any x-rays performed were independently reviewed by myself.   Differential diagnosis were considered with the presenting HPI.  Medications  morphine 4 MG/ML injection 4 mg (0 mg Intravenous Hold 09/17/19 1751)  ondansetron (ZOFRAN) injection 4 mg (0 mg Intravenous Hold 09/17/19 1751)  allopurinol (ZYLOPRIM) tablet 300 mg (300 mg Oral Given 09/18/19 1013)  baclofen (LIORESAL) tablet 10 mg (10 mg Oral Given 09/18/19 1009)  carbidopa-levodopa (SINEMET IR) 10-100 MG per tablet immediate release 1 tablet (1 tablet Oral Given 09/18/19 1009)  colchicine tablet 0.6 mg (0.6 mg Oral Given 09/18/19 1013)  gabapentin (NEURONTIN) capsule 100 mg (100 mg Oral Not Given 09/18/19 0046)  HYDROcodone-acetaminophen (NORCO/VICODIN) 5-325 MG per tablet 1 tablet (has no administration in time range)  ipratropium-albuterol (DUONEB) 0.5-2.5 (3) MG/3ML nebulizer solution  3 mL (has no administration in time range)  levETIRAcetam (KEPPRA) tablet 250 mg (250 mg Oral Given 09/18/19 1013)  levothyroxine (SYNTHROID) tablet 125 mcg (125 mcg Oral Given 09/18/19 0630)  multivitamin with minerals tablet 1 tablet (1 tablet Oral Given 09/18/19 1010)  nitroGLYCERIN (NITROSTAT) SL tablet 0.4 mg (has no administration in time range)  pantoprazole (PROTONIX) EC tablet 40 mg (40 mg Oral Not Given 09/18/19 0046)  simvastatin (ZOCOR) tablet 40 mg (has no administration in time range)  latanoprost (XALATAN) 0.005 % ophthalmic solution 1 drop (1 drop Both Eyes Not Given 09/18/19 0046)  albuterol (PROVENTIL) (2.5 MG/3ML)  0.083% nebulizer solution 2.5 mg (has no administration in time range)  enoxaparin (LOVENOX) injection 40 mg (40 mg Subcutaneous Refused 09/18/19 1029)  aspirin EC tablet 81 mg (81 mg Oral Given 09/18/19 1013)  0.9 %  sodium chloride infusion ( Intravenous Rate/Dose Verify 09/18/19 1500)  insulin aspart (novoLOG) injection 0-15 Units (0 Units Subcutaneous Not Given 09/18/19 1238)  LORazepam (ATIVAN) injection 2 mg (2 mg Intravenous Not Given 09/17/19 2152)  LORazepam (ATIVAN) 2 MG/ML injection (  Not Given 09/17/19 2152)  fluticasone furoate-vilanterol (BREO ELLIPTA) 200-25 MCG/INH 1 puff (1 puff Inhalation Not Given 09/18/19 0921)  umeclidinium bromide (INCRUSE ELLIPTA) 62.5 MCG/INH 1 puff (1 puff Inhalation Not Given 09/18/19 0838)  morphine 2 MG/ML injection 2 mg (2 mg Intravenous Given 09/18/19 1059)  sodium chloride 0.9 % bolus 1,000 mL (0 mLs Intravenous Stopped 09/17/19 1305)  gadobutrol (GADAVIST) 1 MMOL/ML injection 10 mL (10 mLs Intravenous Contrast Given 09/17/19 1506)  cefTRIAXone (ROCEPHIN) 1 g in sodium chloride 0.9 % 100 mL IVPB (0 g Intravenous Stopped 09/17/19 1838)  LORazepam (ATIVAN) injection 2-4 mg (2 mg Intravenous Given 09/17/19 2136)  LORazepam (ATIVAN) injection 4 mg (4 mg Intravenous Given 09/18/19 1059)  gadobutrol (GADAVIST) 1 MMOL/ML injection 9 mL (9 mLs Intravenous Contrast Given 09/18/19 1143)    Vitals:   09/18/19 1000 09/18/19 1015 09/18/19 1200 09/18/19 1400  BP: (!) 164/83  119/64 120/66  Pulse: 89 88 95 88  Resp:      Temp:   98.7 F (37.1 C) 98.2 F (36.8 C)  TempSrc:   Oral Oral  SpO2: 91% 94%  93%  Weight:      Height:        Final diagnoses:  Generalized weakness  Dehydration    Admission/ observation were discussed with the admitting physician, patient and/or family and they are comfortable with the plan.   Final Clinical Impression(s) / ED Diagnoses Final diagnoses:  Generalized weakness  Dehydration    Rx / DC Orders ED Discharge Orders    None        Deno Etienne, DO 09/18/19 1637

## 2019-09-17 NOTE — ED Notes (Signed)
Per MRI tech, radiologist to be contacted regarding administration of contrast from previous MRI study.  Will wait for further notice.

## 2019-09-17 NOTE — ED Notes (Signed)
Blood cultures set x2 collected after antibiotics started.  Admitting provider Sherral Hammers aware.

## 2019-09-17 NOTE — ED Provider Notes (Signed)
Blood pressure 138/65, pulse 80, temperature 98.3 F (36.8 C), temperature source Oral, resp. rate 16, height 5\' 7"  (1.702 m), weight 93 kg, SpO2 96 %.  Assuming care from Dr. Tyrone Nine.  In short, [name] is a 79 y.o. male with a chief complaint of Weakness .  Refer to the original H&P for additional details.  The current plan of care is to f/u on MRI and reassess.  05:20 PM   MRI reviewed of the cervical spine which is stable from prior.  I have reviewed the lab work and discussed the case with the patient and wife at bedside.  I have started additional IV fluids and will cover with Rocephin given the patient's history of frequent UTIs with relative lack of symptoms in those instances.  I have reviewed prior urine cultures.  Patient has a leukocytosis with left shift on CBC.  He is afebrile.  I do not have concern clinically for sepsis.  Given the patient's history I believe he would benefit from additional MRI of his thoracic and lumbar spine given his increase in myoclonic jerking especially focal jerks in the left leg although his back pain is not worse from baseline. Have ordered this and will admit to [facility].   Discussed patient's case with [facility], Dr. Sherral Hammers to request admission. Patient and family (if present) updated with plan. Care transferred to [facility] service.  I reviewed all nursing notes, vitals, pertinent old records, EKGs, labs, imaging (as available).     Margette Fast, MD 09/17/19 1728

## 2019-09-17 NOTE — ED Notes (Signed)
Pt assisted to use urinal, placed in clean brief.

## 2019-09-17 NOTE — Telephone Encounter (Signed)
Pt's wife (on DPR)[name],Cesar has called to inform pt is still having leg weakness and is unable to stand. Wife asked for pt to be seen next week.  Wife told of next available date for NP and Dr Dohmeier both in month of May.  Wife will keep current appointment, be on waitl ist but would still like a call from RN, pt unable to sleep at night.

## 2019-09-17 NOTE — ED Notes (Signed)
Pt placed on 2 L O2 via [place] due to O2 sats decreasing to high 80's.  Pt asleep upon entering room.   Pt reports wearing CPAP at night. Will continue to monitor.

## 2019-09-18 ENCOUNTER — Encounter (HOSPITAL_COMMUNITY): Payer: Self-pay | Admitting: Internal Medicine

## 2019-09-18 ENCOUNTER — Inpatient Hospital Stay (HOSPITAL_COMMUNITY): Payer: Medicare Other

## 2019-09-18 DIAGNOSIS — I351 Nonrheumatic aortic (valve) insufficiency: Secondary | ICD-10-CM

## 2019-09-18 DIAGNOSIS — R3981 Functional urinary incontinence: Secondary | ICD-10-CM

## 2019-09-18 LAB — COMPREHENSIVE METABOLIC PANEL
ALT: 8 U/L (ref 0–44)
AST: 16 U/L (ref 15–41)
Albumin: 3.8 g/dL (ref 3.5–5.0)
Alkaline Phosphatase: 62 U/L (ref 38–126)
Anion gap: 10 (ref 5–15)
BUN: 26 mg/dL — ABNORMAL HIGH (ref 8–23)
CO2: 27 mmol/L (ref 22–32)
Calcium: 8.9 mg/dL (ref 8.9–10.3)
Chloride: 103 mmol/L (ref 98–111)
Creatinine, Ser: 1.18 mg/dL (ref 0.61–1.24)
GFR calc Af Amer: >60 mL/min (ref >60)
GFR calc non Af Amer: 59 mL/min — ABNORMAL LOW (ref >60)
Glucose, Bld: 99 mg/dL (ref 70–99)
Potassium: 4.6 mmol/L (ref 3.5–5.1)
Sodium: 140 mmol/L (ref 135–145)
Total Bilirubin: 0.8 mg/dL (ref 0.3–1.2)
Total Protein: 6.9 g/dL (ref 6.5–8.1)

## 2019-09-18 LAB — ECHOCARDIOGRAM COMPLETE
Height: 67 in
Weight: 3280 oz

## 2019-09-18 LAB — MAGNESIUM: Magnesium: 2.4 mg/dL (ref 1.7–2.4)

## 2019-09-18 LAB — CBG MONITORING, ED
Glucose-Capillary: 103 mg/dL — ABNORMAL HIGH (ref 70–99)
Glucose-Capillary: 104 mg/dL — ABNORMAL HIGH (ref 70–99)
Glucose-Capillary: 91 mg/dL (ref 70–99)

## 2019-09-18 LAB — GLUCOSE, CAPILLARY
Glucose-Capillary: 101 mg/dL — ABNORMAL HIGH (ref 70–99)
Glucose-Capillary: 127 mg/dL — ABNORMAL HIGH (ref 70–99)
Glucose-Capillary: 81 mg/dL (ref 70–99)
Glucose-Capillary: 94 mg/dL (ref 70–99)

## 2019-09-18 LAB — PHOSPHORUS: Phosphorus: 4.2 mg/dL (ref 2.5–4.6)

## 2019-09-18 LAB — URINE CULTURE: Culture: 30000 — AB

## 2019-09-18 LAB — HEMOGLOBIN A1C
Hgb A1c MFr Bld: 5.8 % — ABNORMAL HIGH (ref 4.8–5.6)
Mean Plasma Glucose: 119.76 mg/dL

## 2019-09-18 LAB — SARS CORONAVIRUS 2 (TAT 6-24 HRS): SARS Coronavirus 2: NEGATIVE

## 2019-09-18 IMAGING — MR MR LUMBAR SPINE WO/W CM
7 of 8 series · 34 of 48 positions shown · IV contrast (gadavist)
Comparison: [DATE]

CLINICAL DATA: Weakness and difficulty standing. Low back pain.

EXAM:
MRI LUMBAR SPINE WITHOUT AND WITH CONTRAST
TECHNIQUE: Multiplanar and multiecho pulse sequences of the lumbar spine were
obtained without and with intravenous contrast.
CONTRAST:  9mL GADAVIST GADOBUTROL 1 MMOL/ML IV SOLN

[Series 5: T1 · sagittal · 4.0mm · 0.81mm/px · 4 of 14 slices shown (1 of 2)]
[im 1/14]
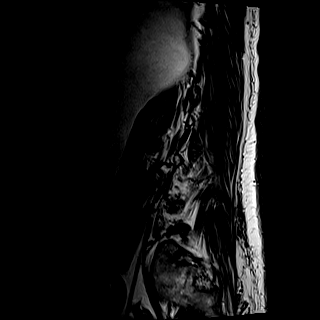
[im 5/14]
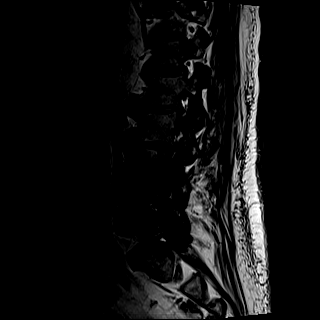
[im 9/14]
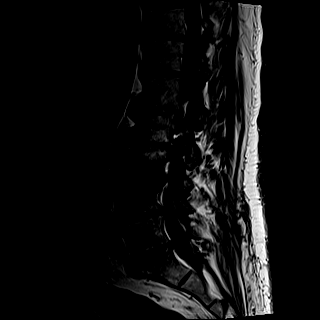
[im 14/14]
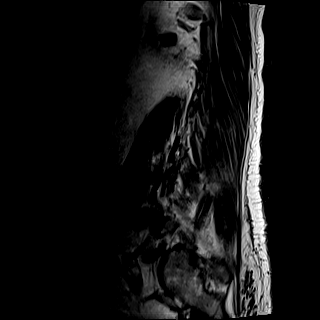

[Series 6: T2 · sagittal · 4.0mm · 0.81mm/px · 4 of 14 slices shown (1 of 2)]
[im 1/14]
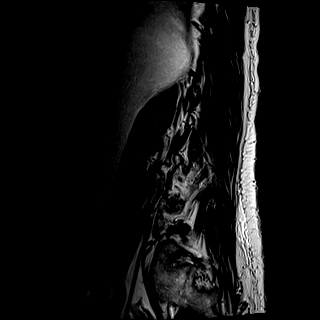
[im 5/14]
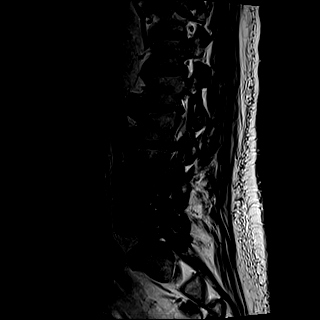
[im 9/14]
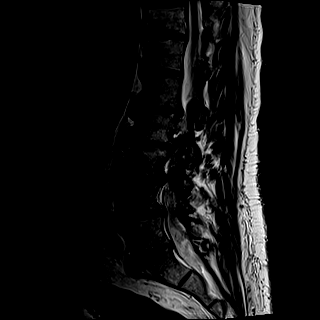
[im 14/14]
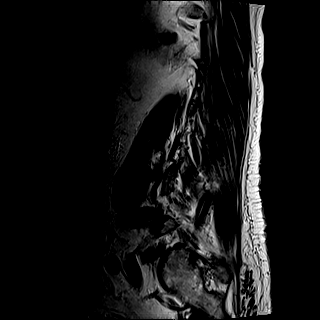

[Series 7: STIR · sagittal · 4.0mm · 0.51mm/px · 4 of 14 slices shown]
[im 1/14]
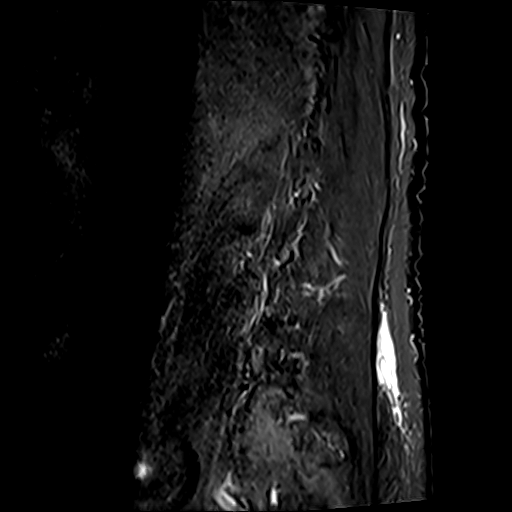
[im 5/14]
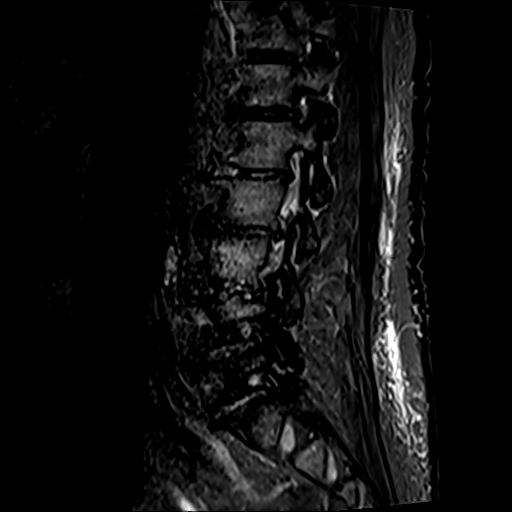
[im 9/14]
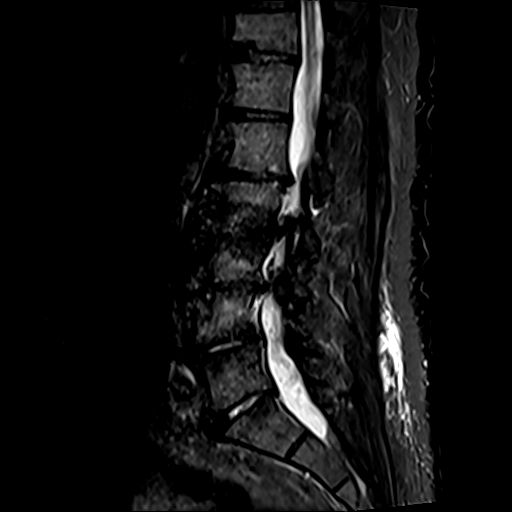
[im 14/14]
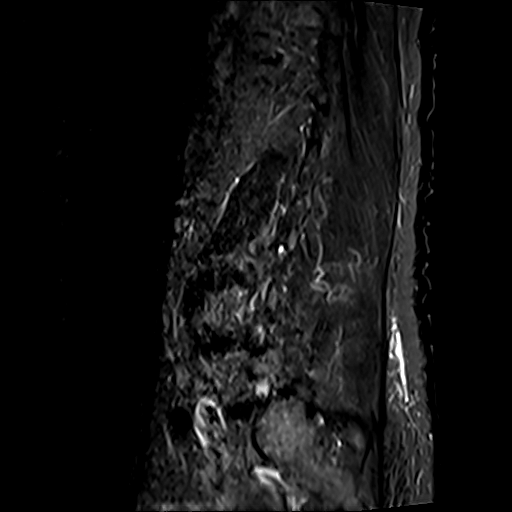

[Series 8: T2 · axial · 4.0mm · 0.78mm/px · z∈[-81,+147]mm · 8 of 43 slices shown (2 of 2)]
[im 1/43]
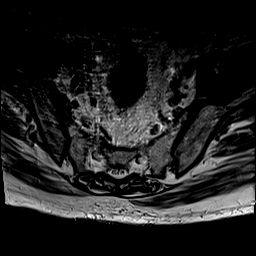
[im 5/43]
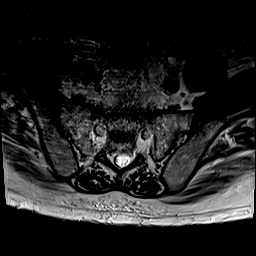
[im 15/43]
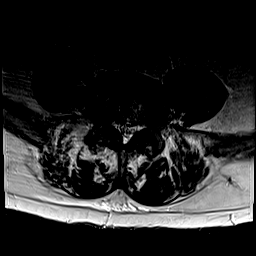
[im 19/43]
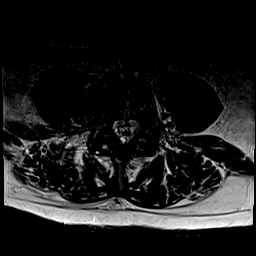
[im 24/43]
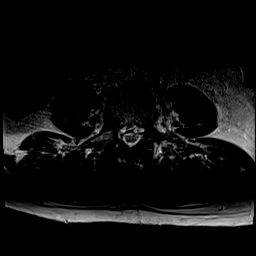
[im 29/43]
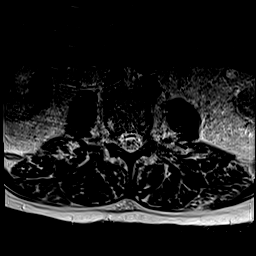
[im 38/43]
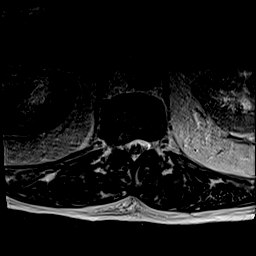
[im 43/43]
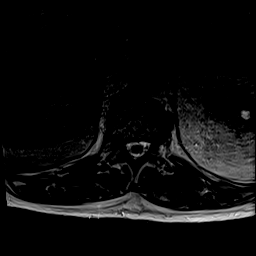

[Series 9: T1 · axial · 4.0mm · 0.57mm/px · z∈[-67,+149]mm · 8 of 42 slices shown (2 of 2)]
[im 1/42]
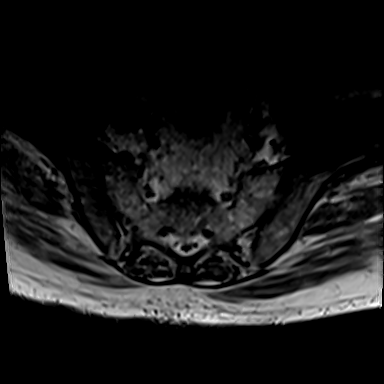
[im 5/42]
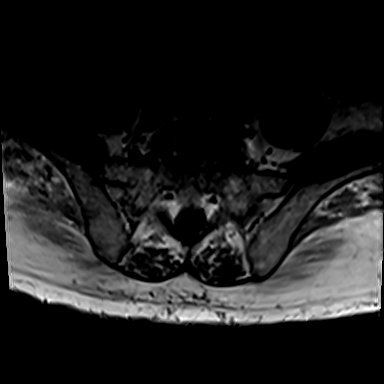
[im 14/42]
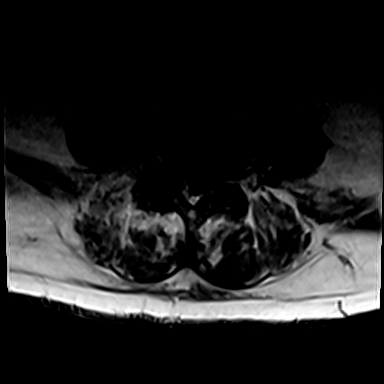
[im 19/42]
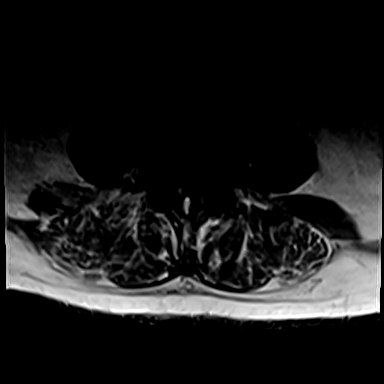
[im 23/42]
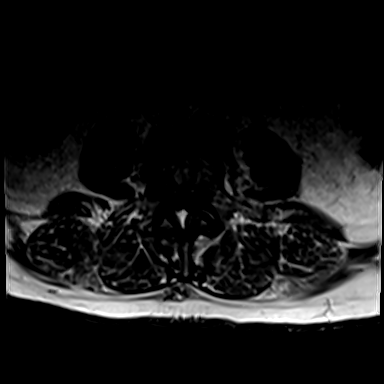
[im 28/42]
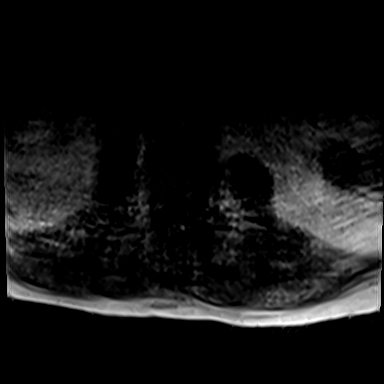
[im 37/42]
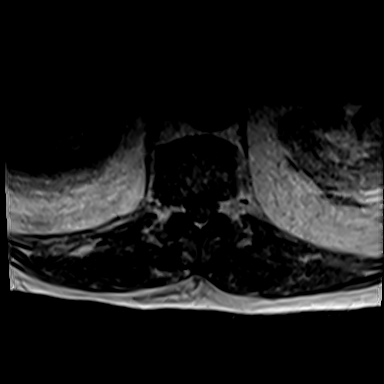
[im 42/42]
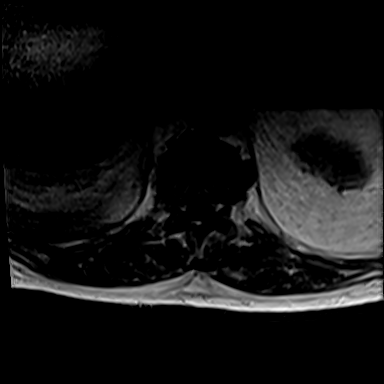

[Series 10: T1 fat-sat post-contrast · sagittal · 4.0mm · 0.81mm/px · 3 of 14 slices shown]
[im 1/14]
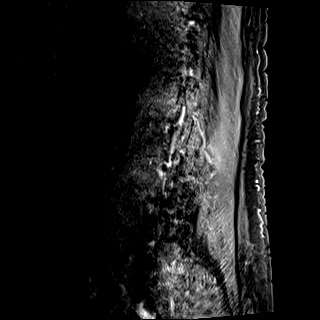
[im 7/14]
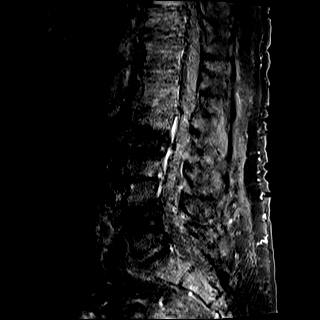
[im 14/14]
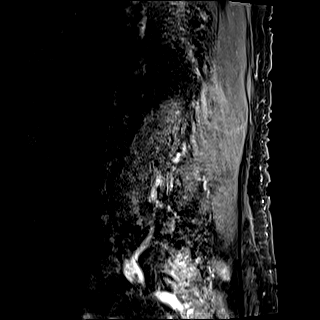

[Series 11: T1 post-contrast · axial · 4.0mm · 0.57mm/px · z∈[-67,-2]mm · 3 of 42 slices shown]
[im 1/42]
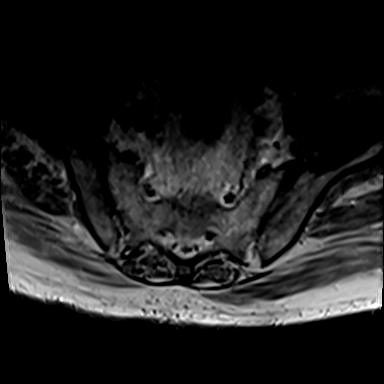
[im 5/42]
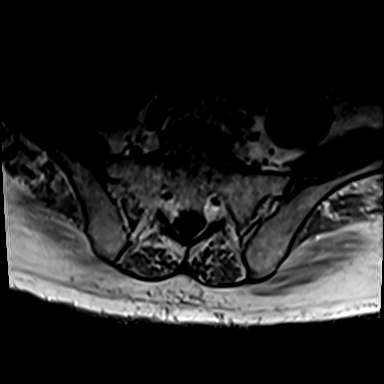
[im 14/42]
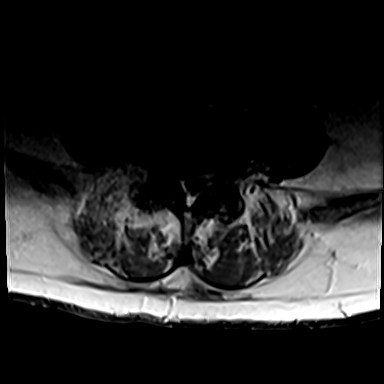

[34 of 48 positions shown; findings below may reference images not displayed]

FINDINGS: Despite efforts by the technologist and patient, marked motion
artifact is present on today's exam and could not be eliminated.
This reduces exam sensitivity and specificity.

Segmentation: The lowest lumbar type non-rib-bearing vertebra is
labeled as L5.

Alignment: 4 mm degenerative retrolisthesis at L2-3 and L3-4 as on
prior exams.

Vertebrae: Type 2 degenerative endplate findings at L2-3 and L4-5
with type 1 degenerative endplate findings at L3-4. Disc desiccation
and loss of disc height is present at all lumbar levels with
scattered Schmorl's nodes. Congenitally short pedicles in the upper
and mid lumbar spine.

Conus medullaris and cauda equina: Conus extends to the L1 level.
There is no expansion of the conus. Questionable mild accentuated
central signal in the distal thoracic cord, as suggested on prior
thoracic spine MRI, although assessment is again limited by motion
artifact. No appreciable abnormal enhancement of the cauda equina to
suggest arachnoiditis.

Paraspinal and other soft tissues: Bilateral renal fluid signal
intensity lesions favor cysts.

Disc levels:

T12-L1: Mild displacement of the right T12 nerve in the lateral
extraforaminal space due to right lateral extraforaminal disc
protrusion. Right lateral recess disc protrusion is also noted.
Roughly stable from prior.

L1-2: Moderate central narrowing of the thecal sac due to disc bulge
and facet arthropathy. AP diameter of the thecal sac is 0.7 cm.
Minimally worsened from prior.

L2-3: Prominent central narrowing of the thecal sac with borderline
left foraminal stenosis and likely mild bilateral subarticular
lateral recess stenosis due to disc bulge, intervertebral spurring,
and facet arthropathy. AP diameter of the thecal sac is 0.5 cm.
Minimally worsened from prior.

L3-4: Prominent central narrowing of the thecal sac with moderate to
prominent right and mild left foraminal stenosis and right greater
than left subarticular lateral recess stenosis due to intervertebral
spurring, disc bulge, and facet arthropathy. AP diameter the thecal
sac is 0.6 cm. Essentially stable from prior.

L4-5: Moderate right and mild left foraminal stenosis with moderate
central narrowing of the thecal sac due to right greater than left
facet arthropathy, intervertebral spurring, and disc bulge.
Appearance is stable from prior.

L5-S1: Mild right and borderline left foraminal stenosis due to
intervertebral and facet spurring along with mild disc bulge.
Appearance is stable from prior.
IMPRESSION: 1. Lumbar spondylosis, degenerative disc disease, and congenitally
short pedicles causing prominent impingement at L2-3 and L3-4;
moderate impingement at L1-2 and L4-5; and mild impingement at
T12-L1 and L5-S1, as detailed above. Impingement at L1-2 and L2-3 is
minimally worsened from the prior exam of [DATE].
[DATE]. Questionable accentuated central T2 signal in the distal thoracic
cord, as suggested on prior thoracic spine MRI, although assessment
is limited by motion artifact.

## 2019-09-18 MED ORDER — IBUPROFEN 200 MG PO TABS: 600.0000 mg | ORAL_TABLET | Freq: Three times a day (TID) | ORAL | Status: DC | PRN

## 2019-09-18 MED ORDER — METHYLPREDNISOLONE SODIUM SUCC 125 MG IJ SOLR
60.0000 mg | Freq: Three times a day (TID) | INTRAMUSCULAR | Status: AC
  Administered 2019-09-19 – 2019-09-24 (×18): 60 mg via INTRAVENOUS
  Filled 2019-09-18 (×18): qty 2

## 2019-09-18 MED ORDER — UMECLIDINIUM BROMIDE 62.5 MCG/INH IN AEPB
1.0000 | INHALATION_SPRAY | Freq: Every day | RESPIRATORY_TRACT | Status: DC
  Administered 2019-09-19 – 2019-09-25 (×7): 1 via RESPIRATORY_TRACT
  Filled 2019-09-18: qty 7

## 2019-09-18 MED ORDER — METHYLPREDNISOLONE SODIUM SUCC 125 MG IJ SOLR
60.0000 mg | Freq: Three times a day (TID) | INTRAMUSCULAR | Status: DC
  Administered 2019-09-18: 60 mg via INTRAVENOUS
  Filled 2019-09-18: qty 2

## 2019-09-18 MED ORDER — GADOBUTROL 1 MMOL/ML IV SOLN
9.0000 mL | Freq: Once | INTRAVENOUS | Status: AC | PRN
  Administered 2019-09-18: 9 mL via INTRAVENOUS

## 2019-09-18 MED ORDER — MORPHINE SULFATE (PF) 2 MG/ML IV SOLN
2.0000 mg | INTRAVENOUS | Status: DC | PRN
  Administered 2019-09-18 – 2019-09-21 (×6): 2 mg via INTRAVENOUS
  Filled 2019-09-18 (×8): qty 1

## 2019-09-18 MED ORDER — INSULIN ASPART 100 UNIT/ML ~~LOC~~ SOLN
0.0000 [IU] | SUBCUTANEOUS | Status: DC
  Administered 2019-09-19 (×2): 3 [IU] via SUBCUTANEOUS
  Administered 2019-09-19 (×3): 4 [IU] via SUBCUTANEOUS
  Administered 2019-09-19: 7 [IU] via SUBCUTANEOUS
  Administered 2019-09-20 (×2): 3 [IU] via SUBCUTANEOUS
  Administered 2019-09-20 (×3): 4 [IU] via SUBCUTANEOUS
  Administered 2019-09-20 – 2019-09-21 (×4): 3 [IU] via SUBCUTANEOUS
  Administered 2019-09-21: 4 [IU] via SUBCUTANEOUS
  Administered 2019-09-21 – 2019-09-22 (×5): 3 [IU] via SUBCUTANEOUS
  Administered 2019-09-22 (×2): 4 [IU] via SUBCUTANEOUS
  Administered 2019-09-23 – 2019-09-24 (×7): 3 [IU] via SUBCUTANEOUS
  Administered 2019-09-24: 4 [IU] via SUBCUTANEOUS
  Administered 2019-09-25 – 2019-09-26 (×2): 3 [IU] via SUBCUTANEOUS

## 2019-09-18 MED ORDER — FLUTICASONE FUROATE-VILANTEROL 200-25 MCG/INH IN AEPB
1.0000 | INHALATION_SPRAY | Freq: Every day | RESPIRATORY_TRACT | Status: DC
  Administered 2019-09-19 – 2019-09-26 (×8): 1 via RESPIRATORY_TRACT
  Filled 2019-09-18: qty 28

## 2019-09-18 MED ORDER — LORAZEPAM 2 MG/ML IJ SOLN
4.0000 mg | Freq: Once | INTRAMUSCULAR | Status: AC
  Administered 2019-09-18: 4 mg via INTRAVENOUS

## 2019-09-18 NOTE — ED Notes (Signed)
Patient going to MRI then up to 5E after. Report has been given to [facility], Therapist, sports. MRI tech aware. Patient has all belongings.

## 2019-09-18 NOTE — ED Notes (Signed)
When RN went to give patient 1000 meds patient stated " I have been calling for the past 45 minutes and no one came. The lady with the ultrasound came in and didn't give me the call bell back". RN apologized for patient being unable to notify staff and secured call bell in patient's reach, gave patient his medications, updated him on when he will be going to MRI, and gave patient orange juice upon request.

## 2019-09-18 NOTE — Progress Notes (Signed)
  Echocardiogram 2D Echocardiogram has been performed.  [name] 09/18/2019, 9:14 AM

## 2019-09-18 NOTE — ED Notes (Signed)
Patient refused lumbar MRI

## 2019-09-18 NOTE — Progress Notes (Signed)
PROGRESS NOTE    [name]  [id number] DOB: 1940-09-05 DOA: 09/17/2019 PCP: [name], MD     Brief Narrative:   [name] is a 79 y.o. 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 This been going on for a couple days. Got to the point where he is having trouble getting around his house. Denies any fall or injury. His chronic low back pain with radiation down the left leg that he does not think is significantly changed. Denies cough denies congestion denies fever. Denies loss of bowel or bladder denies loss peritoneal sensation. Has chronic numbness and weakness to his left leg that is unchanged. Denies recent head injury. Denies dark stool or blood in his stool. Thinks he has been eating and drinking normally.  -Patient states can normally ambulate short distances with walker.  Chronic LEFT hemiparesis.  Chronic L LE spasm, states approximately 2 weeks ago began to have increased L LE weakness with pain.  Woke up this morning unable to ambulate at all.  Increased L LE pain/decreased sensation lateral aspect of L LE.  States negative increased back pain.  Negative increased stool/urine incontinence.   Subjective: A/O x4, negative CP, positive chronic S OB.  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    Acute on chronic lower extremity weakness. -Per patient usually can ambulate short distance with walker.  Starting today unable to ambulate at all, with increased LLE pain -MRI T-spine/L-spine pending  Acute on chronic nerve root impingement L-spine -Solu-Medrol IV 60 mg TID -Although patient extremely poor candidate for any surgical intervention, (acute on chronic), multiple comorbid conditions), will contact neurosurgery in the A.m.  LLE hypoesthesia -Per patient 3/31 began to have lateral leg loss of sensation.  Bowel/bladder incontinence -Chronic.  Per patient stable   Chronic diastolic CHF? -Per last echocardiogram CHF indeterminate.  However patient on multiple medications consistent with CHF. -3/31 echocardiogram pending -Strict in and out -Daily weight -Monitor closely for fluid overload -Hold all BP medication patient's BP borderline  Essential HTN -See CHF  THORACIC ascending aortic aneurysm (per past medical history in chart) -Patient states unaware of this condition -08/18/2017 reported  as 4.2 cm -MRI of thoracic spine should show any enlargement.  Per guidelines patient should have yearly evaluation.    Chronic respiratory failure with  hypoxia -Patient on 2 L O2 at home -Titrate O2 to maintain SPO2> 88% -Albuterol q 4hr as needed -Fluticasone-Umeclidin-Vilant 100-60 2.5-25 mcg/INH daily -DuoNeb QID PRN  OSA/OHS -CPAP per respiratory  Acute renal failure (baseline Cr 0.87) -Normal saline 58ml/hr  DM type II controlled with complication -Hold home medications secondary to acute renal failure -4/1 increase resistant SSI (on steroids)   L LE DVT? -Patient with left calf swelling and pain to palpation D-dimer pending.  If positive will obtain lower extremity Doppler   DVT prophylaxis: Lovenox Code Status: Full Family Communication:  Disposition Plan: TBD. 1.  Where the patient is from 2.  Anticipated d/c place. 3.  Barriers to d/c resolution of acute neurologic deficit   Consultants:    Procedures/Significant Events:  3/31 CT head Wo contrast;No evidence of acute infarction, hemorrhage, hydrocephalus, extra-axial collection or mass lesion/mass effect. Scattered low-density changes within the periventricular and subcortical white matter compatible with chronic microvascular ischemic change. Mild diffuse cerebral volume loss.  3/31 MRI C-spine W/WO contrast; -Postoperative changes from prior ACDF at C3-C6. -mild to moderate diffuse spinal stenosis with moderate to severe bilateral foraminal narrowing  Overall, appearance is improved relative to preoperative exam from 2019. -Patchy cord signal abnormality at the level of C3-4, consistent with chronic myelomalacia. -Trace 2 mm retrolisthesis of C2 on C3, with 2-3 mm anterolisthesis of C6 on C7 and C7 on T1. Findings chronic and facet mediated. -Cord: Probable patchy cord signal abnormality within the cervical spinal cord at the level of C3-4, consistent with chronic myelomalacia related to previously seen compressive stenosis.  -C2-C3: Retrolisthesis. Mild diffuse disc bulge. Right>>LEFT. Moderate left with mild right C3 foraminal stenosis. -C3-C4: Prior  fusion. Residual posterior endplate osseous ridging indents the ventral thecal sac with persistent mild to moderate spinal stenosis.  -Persistent moderate to severe right worse than left C4 foraminal narrowing. Appearance is improved relative to previous exam. -C4-C5: Prior fusion. Residual posterior endplate osseous ridging indents the ventral thecal sac with persistent mild spinal stenosis. -Moderate bilateral facet hypertrophy. Persistent moderate to severe bilateral foraminal narrowing. Appearance is slightly improved from previous exam. -C5-C6: Prior fusion. Residual central endplate osseous ridging indents the ventral thecal sac, contacting and mildly flattening the cervical spinal cord. Persistent mild to moderate spinal stenosis,improved from previous.  -Moderate to severe right worse than left C6 foraminal narrowing, also improved. -C6-C7: Anterolisthesis. Mild disc bulge with bilateral uncovertebral hypertrophy.  -Moderate bilateral C7 foraminal narrowing, grossly stable. -C7-T1: Anterolisthesis. Negative interspace. Bilateral facet hypertrophy. No significant spinal stenosis. Foramina remain patent. -T1-2: Diffuse disc bulge. Posterior element hypertrophy. Resultant mild spinal stenosis with moderate left worse than right foraminal narrowing, grossly similar. 3/31 MRI T-spine;- area of hyperintense T2-weighted signal within the lower thoracic spinal cord.Incompletely evaluated due to motion artifact and lack of IV contrast.  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 4/1 MRI L-spine;-Lumbar spondylosis, degenerative disc disease, and congenitally short pedicles causing prominent impingement at L2-3 and L3-4; -Moderate impingement at L1-2 and L4-5; and  -Mild impingement T12-L1 and L5-S1,  -Impingement at L1-2 and L2-3 is minimally worsened from the prior exam of 07/24/2017. -Questionable accentuated central T2 signal in  the distal thoracic cord, as suggested on prior thoracic spine MRI   I have personally reviewed and interpreted all radiology studies and my  findings are as above.  VENTILATOR SETTINGS:    Cultures   Antimicrobials:    Devices    LINES / TUBES:      Continuous Infusions: . sodium chloride 50 mL/hr at 09/18/19 0021  . lactated ringers Stopped (09/18/19 0000)     Objective: Vitals:   09/18/19 0426 09/18/19 0500 09/18/19 0600 09/18/19 0700  BP:  (!) 155/85 (!) 162/78 (!) 161/76  Pulse:  77 74 73  Resp:    15  Temp: 98.6 F (37 C)     TempSrc: Oral     SpO2:  96% 98% 98%  Weight:      Height:        Intake/Output Summary (Last 24 hours) at 09/18/2019 [id number] Last data filed at 09/18/2019 0600 Gross per 24 hour  Intake 1434.01 ml  Output 950 ml  Net 484.01 ml   Filed Weights   09/17/19 1108  Weight: 93 kg    Examination:  General: A/O x4, positive chronic respiratory distress (on 2 L O2 via [place])  Eyes: negative scleral hemorrhage, negative anisocoria, negative icterus ENT: Negative Runny nose, negative gingival bleeding, Neck:  Negative scars, masses, torticollis, lymphadenopathy, JVD Lungs: Clear to auscultation bilaterally without wheezes or crackles Cardiovascular: Regular rate and rhythm without murmur gallop or rub normal S1 and S2 Abdomen: OBESE, negative abdominal pain, nondistended, positive soft, bowel sounds, no rebound, no ascites, no appreciable mass Extremities: No significant cyanosis, clubbing, or edema bilateral lower extremities Skin: Negative rashes, lesions, ulcers Psychiatric:  Negative depression, negative anxiety, negative fatigue, negative mania  Central nervous system:  Cranial nerves II through XII intact, tongue/uvula midline, all RIGHT extremities muscle strength 5/5, LEFT hemiparesis, LEFT decreased sensation L LE, sensation intact throughout remainder of extremity, negative dysarthria, negative expressive aphasia, negative receptive  aphasia.  .     Data Reviewed: Care during the described time interval was provided by me .  I have reviewed this patient's available data, including medical history, events of note, physical examination, and all test results as part of my evaluation.  CBC: Recent Labs  Lab 09/17/19 1111 09/17/19 2029  WBC 13.1* 9.8  NEUTROABS 10.3*  --   HGB 13.3 12.6*  HCT 40.5 38.2*  MCV 91.2 91.4  PLT 214 [id number]   Basic Metabolic Panel: Recent Labs  Lab 09/17/19 1111 09/17/19 2029 09/18/19 0747  NA 138  --  140  K 4.8  --  4.6  CL 101  --  103  CO2 27  --  27  GLUCOSE 109*  --  99  BUN 43*  --  26*  CREATININE 1.64* 1.26* 1.18  CALCIUM 9.3  --  8.9  MG  --   --  2.4  PHOS  --   --  4.2   GFR: Estimated Creatinine Clearance: 56.1 mL/min (by C-G formula based on SCr of 1.18 mg/dL). Liver Function Tests: Recent Labs  Lab 09/17/19 1111 09/18/19 0747  AST 21 16  ALT 10 8  ALKPHOS 61 62  BILITOT 0.6 0.8  PROT 7.0 6.9  ALBUMIN 3.9 3.8   No results for input(s): LIPASE, AMYLASE in the last 168 hours. No results for input(s): AMMONIA in the last 168 hours. Coagulation Profile: No results for input(s): INR, PROTIME in the last 168 hours. Cardiac Enzymes: No results for input(s): CKTOTAL, CKMB, CKMBINDEX, TROPONINI in the last 168 hours. BNP (last 3 results) Recent Labs    02/14/19 1001  PROBNP 482   HbA1C: Recent Labs  09/17/19 2029  HGBA1C 5.8*   CBG: Recent Labs  Lab 09/17/19 2033 09/18/19 0015 09/18/19 0333 09/18/19 0748  GLUCAP 109* 104* 103* 91   Lipid Profile: No results for input(s): CHOL, HDL, LDLCALC, TRIG, CHOLHDL, LDLDIRECT in the last 72 hours. Thyroid Function Tests: Recent Labs    09/17/19 1111 09/17/19 1111 09/17/19 2029  TSH 0.280*   < > 0.223*  FREET4 1.23*  --   --    < > = values in this interval not displayed.   Anemia Panel: No results for input(s): VITAMINB12, FOLATE, FERRITIN, TIBC, IRON, RETICCTPCT in the last 72  hours. Sepsis Labs: No results for input(s): PROCALCITON, LATICACIDVEN in the last 168 hours.  Recent Results (from the past 240 hour(s))  SARS CORONAVIRUS 2 (TAT 6-24 HRS) Nasopharyngeal Nasopharyngeal Swab     Status: None   Collection Time: 09/17/19  5:49 PM   Specimen: Nasopharyngeal Swab  Result Value Ref Range Status   SARS Coronavirus 2 NEGATIVE NEGATIVE Final    Comment: (NOTE) SARS-CoV-2 target nucleic acids are NOT DETECTED. The SARS-CoV-2 RNA is generally detectable in upper and lower respiratory specimens during the acute phase of infection. Negative results do not preclude SARS-CoV-2 infection, do not rule out co-infections with other pathogens, and should not be used as the sole basis for treatment or other patient management decisions. Negative results must be combined with clinical observations, patient history, and epidemiological information. The expected result is Negative. Fact Sheet for Patients: SugarRoll.be Fact Sheet for Healthcare Providers: https://www.-mathews.com/ This test is not yet approved or cleared by the [place] FDA and  has been authorized for detection and/or diagnosis of SARS-CoV-2 by FDA under an Emergency Use Authorization (EUA). This EUA will remain  in effect (meaning this test can be used) for the duration of the COVID-19 declaration under Section 56 4(b)(1) of the Act, 21 U.S.C. section 360bbb-3(b)(1), unless the authorization is terminated or revoked sooner. Performed at [facility], [place] [address] [place], [place] [address]   Culture, blood (routine x 2)     Status: None (Preliminary result)   Collection Time: 09/17/19  6:55 PM   Specimen: BLOOD RIGHT FOREARM  Result Value Ref Range Status   Specimen Description   Final    BLOOD RIGHT FOREARM Performed at [facility] [address], [place], [place] [address]    Special Requests   Final    BOTTLES  DRAWN AEROBIC ONLY Blood Culture results may not be optimal due to an inadequate volume of blood received in culture bottles Performed at [facility] [address], [place], [place] [address]    Culture   Final    NO GROWTH < 12 HOURS Performed at [facility] [address], [place], [place] [address]    Report Status PENDING  Incomplete  Culture, blood (routine x 2)     Status: None (Preliminary result)   Collection Time: 09/17/19  6:55 PM   Specimen: BLOOD LEFT FOREARM  Result Value Ref Range Status   Specimen Description   Final    BLOOD LEFT FOREARM Performed at [facility] [address], [place], [place] [address]    Special Requests   Final    BOTTLES DRAWN AEROBIC AND ANAEROBIC Blood Culture adequate volume Performed at [facility] [address], [place], [place] [address]    Culture   Final    NO GROWTH < 12 HOURS Performed at [facility], [place] [address] [place], [place] [address]    Report Status PENDING  Incomplete         Radiology Studies: CT Head Wo Contrast  Result Date: 09/17/2019 CLINICAL DATA:  Altered mental status EXAM: CT HEAD WITHOUT CONTRAST TECHNIQUE: Contiguous axial images were obtained from the base of the skull through the vertex without intravenous contrast. COMPARISON:  08/23/2017 FINDINGS: Brain: No evidence of acute infarction, hemorrhage, hydrocephalus, extra-axial collection or mass lesion/mass effect. Scattered low-density changes within the periventricular and subcortical white matter compatible with chronic microvascular ischemic change. Mild diffuse cerebral volume loss. Vascular: Atherosclerotic calcifications involving the large vessels of the skull base. No unexpected hyperdense vessel. Skull: Normal. Negative for fracture or focal lesion. Sinuses/Orbits: No acute finding. Other: None. IMPRESSION: 1.  No acute intracranial findings. 2.  Chronic microvascular  ischemic change and cerebral volume loss. Electronically Signed   By: Davina Poke D.O.   On: 09/17/2019 13:52   MR THORACIC SPINE WO CONTRAST  Result Date: 09/17/2019 CLINICAL DATA:  Mid back pain EXAM: MRI THORACIC SPINE WITHOUT CONTRAST TECHNIQUE: Multiplanar, multisequence MR imaging of the thoracic spine was performed. No intravenous contrast was administered. COMPARISON:  None. FINDINGS: The examination is degraded by motion. Axial gradient echo images were not obtained. Alignment: Physiologic. Vertebrae: No fracture, evidence of discitis, or bone lesion. Cord: There is an area of hyperintense T2-weighted signal within the lower thoracic spinal cord. The spinal cord is otherwise poorly evaluated due to motion. Paraspinal and other soft tissues: Small left pleural effusion. Disc levels: There is degenerative disc disease at multiple lower thoracic levels without high-grade spinal canal stenosis. IMPRESSION: 1. Limited examination due to motion artifact and lack of IV contrast. 2. There is an area of hyperintense T2-weighted signal within the lower thoracic spinal cord. This is incompletely evaluated due to motion artifact and lack of IV contrast. Given the limitations of the study, a repeat examination with sedation or anesthesia might be helpful. 3. 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 Electronically Signed   By: Ulyses Jarred M.D.   On: 09/17/2019 22:35   MR Cervical Spine W or Wo Contrast  Result Date: 09/17/2019 CLINICAL DATA:  Initial evaluation for generalized weakness, inability to stand without assistance. EXAM: MRI CERVICAL SPINE WITHOUT AND WITH CONTRAST TECHNIQUE: Multiplanar and multiecho pulse sequences of the cervical spine, to include the craniocervical junction and cervicothoracic junction, were obtained without and with intravenous contrast. CONTRAST:  15mL GADAVIST GADOBUTROL 1 MMOL/ML IV SOLN COMPARISON:  Prior  MRI from 08/23/2017. FINDINGS: Alignment: Examination moderately degraded by motion artifact, somewhat limiting assessment. Straightening of the normal cervical lordosis. Trace 2 mm retrolisthesis of C2 on C3, with 2-3 mm anterolisthesis of C6 on C7 and C7 on T1. Findings chronic and facet mediated. Vertebrae: Susceptibility artifact from prior ACDF at C3-C6. Vertebral body height maintained without evidence for acute or chronic fracture. Bone marrow signal intensity within normal limits. No visible discrete or worrisome osseous lesions on the on this motion degraded exam. No abnormal marrow edema or enhancement. Cord: Probable patchy cord signal abnormality within the cervical spinal cord at the level of C3-4, consistent with chronic myelomalacia related to previously seen compressive stenosis. No other definite cord signal changes on this motion degraded exam. No abnormal enhancement. Posterior Fossa, vertebral arteries, paraspinal tissues: Visualized brain and posterior fossa within normal limits. Retro cerebellar cyst versus mega cisterna magna noted. Craniocervical junction within normal limits. Paraspinous  and prevertebral soft tissues unremarkable. Normal intravascular flow voids seen within the vertebral arteries bilaterally. Disc levels: C2-C3: Retrolisthesis. Mild diffuse disc bulge. Right greater than left facet hypertrophy. No significant spinal stenosis or cord deformity. Moderate left with mild right C3 foraminal stenosis. C3-C4: Prior fusion. Residual posterior endplate osseous ridging indents the ventral thecal sac with persistent mild to moderate spinal stenosis. Bilateral facet hypertrophy. Persistent moderate to severe right worse than left C4 foraminal narrowing. Appearance is improved relative to previous exam. C4-C5: Prior fusion. Residual posterior endplate osseous ridging indents the ventral thecal sac with persistent mild spinal stenosis. Moderate bilateral facet hypertrophy. Persistent  moderate to severe bilateral foraminal narrowing. Appearance is slightly improved from previous exam. C5-C6: Prior fusion. Residual central endplate osseous ridging indents the ventral thecal sac, contacting and mildly flattening the cervical spinal cord. Persistent mild to moderate spinal stenosis, improved from previous. Bilateral facet hypertrophy with right greater than left uncinate spurring with resultant moderate to severe right worse than left C6 foraminal narrowing, also improved. C6-C7: Anterolisthesis. Mild disc bulge with bilateral uncovertebral hypertrophy. Flattening of the ventral thecal sac with no more than mild spinal stenosis. Superimposed bilateral facet hypertrophy. Moderate bilateral C7 foraminal narrowing, grossly stable. C7-T1: Anterolisthesis. Negative interspace. Bilateral facet hypertrophy. No significant spinal stenosis. Foramina remain patent. T1-2: Diffuse disc bulge. Posterior element hypertrophy. Resultant mild spinal stenosis with moderate left worse than right foraminal narrowing, grossly similar. IMPRESSION: 1. Technically limited exam due to extensive motion artifact. 2. Postoperative changes from prior ACDF at C3-C6. Residual endplate osseous ridging with uncovertebral and facet hypertrophy at these levels, with persistent mild to moderate diffuse spinal stenosis with moderate to severe bilateral foraminal narrowing as above. Overall, appearance is improved relative to preoperative exam from 2019. 3. Patchy cord signal abnormality at the level of C3-4, consistent with chronic myelomalacia. Electronically Signed   By: Jeannine Boga M.D.   On: 09/17/2019 15:59   DG Chest Port 1 View  Result Date: 09/17/2019 CLINICAL DATA:  Generalized weakness. EXAM: PORTABLE CHEST 1 VIEW COMPARISON:  CT 12/07/2018 FINDINGS: Chronic cardiomegaly and aortic tortuosity. The right chest is clear. Left hemidiaphragm is elevated, versus the possible presence of a sub pulmonic effusion. There  is volume loss at the left lung base. IMPRESSION: Elevated left hemidiaphragm with volume loss at the left lung base. Cannot rule a sub pulmonic effusion. Cardiomegaly and aortic atherosclerosis. Electronically Signed   By: Nelson Chimes M.D.   On: 09/17/2019 11:54        Scheduled Meds: . allopurinol  300 mg Oral Daily  . aspirin EC  81 mg Oral Daily  . baclofen  10 mg Oral TID  . carbidopa-levodopa  1 tablet Oral BID  . colchicine  0.6 mg Oral Daily  . enoxaparin (LOVENOX) injection  40 mg Subcutaneous Daily  . fluticasone furoate-vilanterol  1 puff Inhalation Daily  . gabapentin  100 mg Oral QHS  . insulin aspart  0-15 Units Subcutaneous Q4H  . latanoprost  1 drop Both Eyes QHS  . levETIRAcetam  250 mg Oral BID  . levothyroxine  125 mcg Oral QAC breakfast  . LORazepam      . LORazepam  2 mg Intravenous Once  . LORazepam  4 mg Intravenous Once  .  morphine injection  4 mg Intravenous Once  . multivitamin with minerals  1 tablet Oral Daily  . ondansetron (ZOFRAN) IV  4 mg Intravenous Once  . pantoprazole  40 mg Oral QPM  . simvastatin  40 mg Oral q1800  . umeclidinium bromide  1 puff Inhalation Daily   Continuous Infusions: . sodium chloride 50 mL/hr at 09/18/19 0021  . lactated ringers Stopped (09/18/19 0000)     LOS: 1 day    Time spent:40 min    Maxmilian Trostel, Geraldo Docker, MD [facility] Pager [phone]  If 7PM-7AM, please contact night-coverage [email or web address] Password [facility] 09/18/2019, 8:32 AM

## 2019-09-18 NOTE — Telephone Encounter (Signed)
Called the patient's wife and was able to offer the patient a apt for Monday at 10:30 am with check in of 10 am. Advised that if for some reason he is kept and still in the hospital she can call and leave a message with the answering service cancelling the apt. If he is still in the hospital then no show would be waived for that situation. Advised that if he is dc then at least we would have him down for an apt. She was appreciative for the call back.

## 2019-09-18 NOTE — ED Notes (Signed)
Patient refused lumbar spine MRI

## 2019-09-18 NOTE — Telephone Encounter (Signed)
Schedule f/u with Dr. Brett Fairy

## 2019-09-18 NOTE — ED Notes (Signed)
RN spoke to patient about repeating MRI with ativan on Dr. Sherral Hammers behalf. Patient agreed to trying MRI again if that is what is needed to help him.

## 2019-09-19 LAB — COMPREHENSIVE METABOLIC PANEL
ALT: 10 U/L (ref 0–44)
AST: 14 U/L — ABNORMAL LOW (ref 15–41)
Albumin: 3.8 g/dL (ref 3.5–5.0)
Alkaline Phosphatase: 63 U/L (ref 38–126)
Anion gap: 10 (ref 5–15)
BUN: 23 mg/dL (ref 8–23)
CO2: 25 mmol/L (ref 22–32)
Calcium: 9.1 mg/dL (ref 8.9–10.3)
Chloride: 106 mmol/L (ref 98–111)
Creatinine, Ser: 0.93 mg/dL (ref 0.61–1.24)
GFR calc Af Amer: >60 mL/min (ref >60)
GFR calc non Af Amer: >60 mL/min (ref >60)
Glucose, Bld: 148 mg/dL — ABNORMAL HIGH (ref 70–99)
Potassium: 4.6 mmol/L (ref 3.5–5.1)
Sodium: 141 mmol/L (ref 135–145)
Total Bilirubin: 0.7 mg/dL (ref 0.3–1.2)
Total Protein: 7.1 g/dL (ref 6.5–8.1)

## 2019-09-19 LAB — CBC WITH DIFFERENTIAL/PLATELET
Abs Immature Granulocytes: 0.03 10*3/uL (ref 0.00–0.07)
Basophils Absolute: 0 10*3/uL (ref 0.0–0.1)
Basophils Relative: 0 %
Eosinophils Absolute: 0 10*3/uL (ref 0.0–0.5)
Eosinophils Relative: 0 %
HCT: 42.7 % (ref 39.0–52.0)
Hemoglobin: 13.8 g/dL (ref 13.0–17.0)
Immature Granulocytes: 0 %
Lymphocytes Relative: 8 %
Lymphs Abs: 0.6 10*3/uL — ABNORMAL LOW (ref 0.7–4.0)
MCH: 30.3 pg (ref 26.0–34.0)
MCHC: 32.3 g/dL (ref 30.0–36.0)
MCV: 93.8 fL (ref 80.0–100.0)
Monocytes Absolute: 0.1 10*3/uL (ref 0.1–1.0)
Monocytes Relative: 1 %
Neutro Abs: 7.4 10*3/uL (ref 1.7–7.7)
Neutrophils Relative %: 91 %
Platelets: 218 10*3/uL (ref 150–400)
RBC: 4.55 MIL/uL (ref 4.22–5.81)
RDW: 14.2 % (ref 11.5–15.5)
WBC: 8.1 10*3/uL (ref 4.0–10.5)
nRBC: 0 % (ref 0.0–0.2)

## 2019-09-19 LAB — GLUCOSE, CAPILLARY
Glucose-Capillary: 131 mg/dL — ABNORMAL HIGH (ref 70–99)
Glucose-Capillary: 156 mg/dL — ABNORMAL HIGH (ref 70–99)
Glucose-Capillary: 169 mg/dL — ABNORMAL HIGH (ref 70–99)
Glucose-Capillary: 176 mg/dL — ABNORMAL HIGH (ref 70–99)
Glucose-Capillary: 185 mg/dL — ABNORMAL HIGH (ref 70–99)
Glucose-Capillary: 202 mg/dL — ABNORMAL HIGH (ref 70–99)

## 2019-09-19 LAB — MAGNESIUM: Magnesium: 2.2 mg/dL (ref 1.7–2.4)

## 2019-09-19 LAB — PHOSPHORUS: Phosphorus: 4.1 mg/dL (ref 2.5–4.6)

## 2019-09-19 MED ORDER — OXYCODONE HCL ER 10 MG PO T12A
10.0000 mg | EXTENDED_RELEASE_TABLET | Freq: Two times a day (BID) | ORAL | Status: DC
  Administered 2019-09-19 – 2019-09-26 (×14): 10 mg via ORAL
  Filled 2019-09-19 (×14): qty 1

## 2019-09-19 MED ORDER — BACLOFEN 10 MG PO TABS
15.0000 mg | ORAL_TABLET | Freq: Three times a day (TID) | ORAL | Status: DC
  Administered 2019-09-19 – 2019-09-26 (×20): 15 mg via ORAL
  Filled 2019-09-19 (×20): qty 2

## 2019-09-19 NOTE — Progress Notes (Signed)
PROGRESS NOTE    [name]  [id number] DOB: 02/09/1941 DOA: 09/17/2019 PCP: [name], MD     Brief Narrative:   [name] is a 79 y.o. 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 This been going on for a couple days. Got to the point where he is having trouble getting around his house. Denies any fall or injury. His chronic low back pain with radiation down the left leg that he does not think is significantly changed. Denies cough denies congestion denies fever. Denies loss of bowel or bladder denies loss peritoneal sensation. Has chronic numbness and weakness to his left leg that is unchanged. Denies recent head injury. Denies dark stool or blood in his stool. Thinks he has been eating and drinking normally.  -Patient states can normally ambulate short distances with walker.  Chronic LEFT hemiparesis.  Chronic L LE spasm, states approximately 2 weeks ago began to have increased L LE weakness with pain.  Woke up this morning unable to ambulate at all.  Increased L LE pain/decreased sensation lateral aspect of L LE.  States negative increased back pain.  Negative increased stool/urine incontinence.   Subjective: 4/2 A/O x4, negative CP, positive chronic S OB.  States this a.m. decreased lumbar pain, decreased bilateral lower extremity muscle spasms.  Muscle spasms bilateral lower extremity remain LEFT>> RIGHT.  Per wife patient has been wheelchair-bound for 6 months.  This is contrary to patient statement that he had been ambulating in the  house with assistance of walker.  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 [facility])   Essential hypertension   PVD (peripheral vascular disease) (HCC)   Chronic diastolic CHF (congestive heart failure) (HCC)   Diabetes mellitus type 2, controlled, with complications ([place])   Obese   Thoracic aortic aneurysm (HCC)   Bowel incontinence   Bladder incontinence   Acute renal failure (ARF) (HCC)   Left hemiparesis (HCC)   Chronic respiratory failure with hypoxia (HCC)   Syncope and collapse   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    Acute on chronic lower extremity weakness. -Per patient usually can ambulate short distance with walker.  Starting today unable to ambulate at all, with increased LLE pain -MRI T-spine/L-spine; shows significant chronic damage, as well as acute changes see results below.  -4/2 with initiation of high-dose steroids decreased weakness, and muscle spasms.  Acute on chronic nerve root impingement L-spine -Although patient extremely poor candidate for any surgical intervention, (acute on chronic), multiple comorbid conditions), will contact neurosurgery in the A.m. -4/2 discussed case with Dr. Kristeen Miss neurosurgery who reviewed all MRI scans agrees patient patient is extremely poor candidate for any surgical procedure.  Agrees medical management -Solu-Medrol  IV 60 mg TID x7 days.  Then transition to prednisone p.o. taper. -4/2 increase baclofen 15 mg TID -4/2 OxyContin 10 mg BID -Continue Vicodin PRN  LLE hypoesthesia -Per patient  3/31 began to have lateral leg loss of sensation.  Bowel/bladder incontinence -Chronic.  Per patient stable   Chronic diastolic CHF? -Per last echocardiogram CHF indeterminate.  However patient on multiple medications consistent with CHF. -3/31 echocardiogram; normal echocardiogram CHF RULED OUT -Strict in and out +1.2 L -Daily weight Filed Weights   09/17/19 1108 09/19/19 0549  Weight: 93 kg 92.3 kg  -Monitor closely for fluid overload -Hold all BP medication patient's BP borderline  Essential HTN -See CHF  THORACIC ascending aortic aneurysm (per past medical history in chart) -Patient states unaware of this condition -08/18/2017 reported as 4.2 cm -MRI of thoracic spine should show any enlargement.  Per guidelines patient should have yearly evaluation.    Chronic respiratory failure with hypoxia -Patient on 2 L O2 at home -Titrate O2 to maintain SPO2> 88% -Albuterol q 4hr as needed -Fluticasone-Umeclidin-Vilant 100-60 2.5-25 mcg/INH daily -DuoNeb QID PRN  OSA/OHS -CPAP per respiratory  Acute renal failure (baseline Cr 0.87) -Normal saline 60ml/hr  DM type II controlled with complication -Hold home medications secondary to acute renal failure -4/1 increase resistant SSI (on steroids)   L LE DVT? -Patient with left calf swelling and pain to palpation; D-dimer negative.  No further work-up required at this point   Goals of care -4/2 PT/OT consult; Evaluate for CIR (preferred) vs SNF   DVT prophylaxis: Lovenox Code Status: Full Family Communication: 4/2 wife at bedside counseled on plan of care answered all questions Disposition Plan: TBD. 1.  Where the patient is from 2.  Anticipated d/c place. 3.  Barriers to d/c resolution of acute neurologic deficit   Consultants:  Neurosurgery phone consult Dr. Kristeen Miss    Procedures/Significant Events:  3/31 CT head Wo contrast;No evidence of acute infarction, hemorrhage, hydrocephalus, extra-axial  collection or mass lesion/mass effect. Scattered low-density changes within the periventricular and subcortical white matter compatible with chronic microvascular ischemic change. Mild diffuse cerebral volume loss.  3/31 MRI C-spine W/WO contrast; -Postoperative changes from prior ACDF at C3-C6. -mild to moderate diffuse spinal stenosis with moderate to severe bilateral foraminal narrowing  Overall, appearance is improved relative to preoperative exam from 2019. -Patchy cord signal abnormality at the level of C3-4, consistent with chronic myelomalacia. -Trace 2 mm retrolisthesis of C2 on C3, with 2-3 mm anterolisthesis of C6 on C7 and C7 on T1. Findings chronic and facet mediated. -Cord: Probable patchy cord signal abnormality within the cervical spinal cord at the level of C3-4, consistent with chronic myelomalacia related to previously seen compressive stenosis.  -C2-C3: Retrolisthesis. Mild diffuse disc bulge. Right>>LEFT. Moderate left with mild right C3 foraminal stenosis. -C3-C4: Prior fusion. Residual posterior endplate osseous ridging indents the ventral thecal sac with persistent mild to moderate spinal stenosis.  -Persistent moderate to severe right worse than left C4 foraminal narrowing. Appearance is improved relative to previous exam. -C4-C5: Prior fusion. Residual posterior endplate osseous ridging indents the ventral thecal sac with persistent mild spinal stenosis. -Moderate bilateral facet hypertrophy. Persistent moderate to severe bilateral foraminal narrowing. Appearance is slightly improved from previous exam. -C5-C6: Prior fusion. Residual central endplate osseous ridging indents the ventral thecal sac, contacting and mildly flattening the cervical spinal cord. Persistent mild to moderate spinal stenosis,improved from previous.  -Moderate to severe right worse than left C6 foraminal narrowing, also improved. -C6-C7: Anterolisthesis.  Mild disc bulge with bilateral  uncovertebral hypertrophy.  -Moderate bilateral C7 foraminal narrowing, grossly stable. -C7-T1: Anterolisthesis. Negative interspace. Bilateral facet hypertrophy. No significant spinal stenosis. Foramina remain patent. -T1-2: Diffuse disc bulge. Posterior element hypertrophy. Resultant mild spinal stenosis with moderate left worse than right foraminal narrowing, grossly similar. 3/31 MRI T-spine;- area of hyperintense T2-weighted signal within the lower thoracic spinal cord.Incompletely evaluated due to motion artifact and lack of IV contrast.  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 4/1 MRI L-spine;-Lumbar spondylosis, degenerative disc disease, and congenitally short pedicles causing prominent impingement at L2-3 and L3-4; -Moderate impingement at L1-2 and L4-5; and  -Mild impingement T12-L1 and L5-S1,  -Impingement at L1-2 and L2-3 is minimally worsened from the prior exam of 07/24/2017. -Questionable accentuated central T2 signal in the distal thoracic cord, as suggested on prior thoracic spine MRI 4/2 Echocardiogram;LVEF= 55 to 60%.  -Normal left ventricle diastolic parameters.      I have personally reviewed and interpreted all radiology studies and my findings are as above.  VENTILATOR SETTINGS:    Cultures   Antimicrobials:    Devices    LINES / TUBES:      Continuous Infusions: . sodium chloride 50 mL/hr at 09/18/19 1500     Objective: Vitals:   09/18/19 2350 09/19/19 0127 09/19/19 0549 09/19/19 0842  BP: (!) 149/74 (!) 150/74 (!) 149/81   Pulse: 83 87 93   Resp: 20 18 20    Temp: 99 F (37.2 C) 98.6 F (37 C) 97.8 F (36.6 C)   TempSrc:   Oral   SpO2: 94% 95% 93% 94%  Weight:   92.3 kg   Height:        Intake/Output Summary (Last 24 hours) at 09/19/2019 [id number] Last data filed at 09/18/2019 1500 Gross per 24 hour  Intake 152.58 ml  Output --  Net 152.58 ml   Filed Weights    09/17/19 1108 09/19/19 0549  Weight: 93 kg 92.3 kg   Physical Exam:  General: A/O x4, positive chronic respiratory distress (on 2 L O2 via [place] at home)  Eyes: negative scleral hemorrhage, negative anisocoria, negative icterus ENT: Negative Runny nose, negative gingival bleeding, Neck:  Negative scars, masses, torticollis, lymphadenopathy, JVD Lungs: Clear to auscultation bilaterally without wheezes or crackles Cardiovascular: Regular rate and rhythm without murmur gallop or rub normal S1 and S2 Abdomen: OBESE, abdominal pain, nondistended, positive soft, bowel sounds, no rebound, no ascites, no appreciable mass Extremities: No significant cyanosis, clubbing, or edema bilateral lower extremities Skin: Lacerations on left knee where he struck the MRI machine and bed rails in the ED Psychiatric:  Negative depression, negative anxiety, negative fatigue, negative mania  Central nervous system:  Cranial nerves II through XII intact, tongue/uvula midline, RIGHT extremity muscle strength 4/5, LEFT hemiparesis, LEFT decreased sensation LLE, sensation intact remainder of extremity, negative dysarthria, negative expressive aphasia, negative receptive aphasia.  .     Data Reviewed: Care during the described time interval was provided by me .  I have reviewed this patient's available data, including medical history, events of note, physical examination, and all test results as part of my evaluation.  CBC: Recent Labs  Lab 09/17/19 1111 09/17/19 2029 09/19/19 0509  WBC 13.1* 9.8 8.1  NEUTROABS 10.3*  --  7.4  HGB 13.3 12.6* 13.8  HCT 40.5 38.2* 42.7  MCV 91.2 91.4 93.8  PLT 214 211 [id number]   Basic Metabolic Panel: Recent Labs  Lab 09/17/19  1111 09/17/19 2029 09/18/19 0747 09/19/19 0509  NA 138  --  140 141  K 4.8  --  4.6 4.6  CL 101  --  103 106  CO2 27  --  27 25  GLUCOSE 109*  --  99 148*  BUN 43*  --  26* 23  CREATININE 1.64* 1.26* 1.18 0.93  CALCIUM 9.3  --  8.9 9.1  MG  --   --   2.4 2.2  PHOS  --   --  4.2 4.1   GFR: Estimated Creatinine Clearance: 70.9 mL/min (by C-G formula based on SCr of 0.93 mg/dL). Liver Function Tests: Recent Labs  Lab 09/17/19 1111 09/18/19 0747 09/19/19 0509  AST 21 16 14*  ALT 10 8 10   ALKPHOS 61 62 63  BILITOT 0.6 0.8 0.7  PROT 7.0 6.9 7.1  ALBUMIN 3.9 3.8 3.8   No results for input(s): LIPASE, AMYLASE in the last 168 hours. No results for input(s): AMMONIA in the last 168 hours. Coagulation Profile: No results for input(s): INR, PROTIME in the last 168 hours. Cardiac Enzymes: No results for input(s): CKTOTAL, CKMB, CKMBINDEX, TROPONINI in the last 168 hours. BNP (last 3 results) Recent Labs    02/14/19 1001  PROBNP 482   HbA1C: Recent Labs    09/17/19 2029  HGBA1C 5.8*   CBG: Recent Labs  Lab 09/18/19 1608 09/18/19 2023 09/18/19 2347 09/19/19 0401 09/19/19 0736  GLUCAP 81 94 127* 156* 131*   Lipid Profile: No results for input(s): CHOL, HDL, LDLCALC, TRIG, CHOLHDL, LDLDIRECT in the last 72 hours. Thyroid Function Tests: Recent Labs    09/17/19 1111 09/17/19 1111 09/17/19 2029  TSH 0.280*   < > 0.223*  FREET4 1.23*  --   --    < > = values in this interval not displayed.   Anemia Panel: No results for input(s): VITAMINB12, FOLATE, FERRITIN, TIBC, IRON, RETICCTPCT in the last 72 hours. Sepsis Labs: No results for input(s): PROCALCITON, LATICACIDVEN in the last 168 hours.  Recent Results (from the past 240 hour(s))  Culture, Urine     Status: Abnormal   Collection Time: 09/17/19  1:05 PM   Specimen: Urine, Clean Catch  Result Value Ref Range Status   Specimen Description   Final    URINE, CLEAN CATCH Performed at [facility], [place] [address], [place], [place] [address]    Special Requests   Final    NONE Performed at [facility], [place] [address] [place], [place] [address]    Culture (A)  Final    30,000 COLONIES/mL DIPHTHEROIDS(CORYNEBACTERIUM  SPECIES) Standardized susceptibility testing for this organism is not available. Performed at [facility], [place] [address] [place], [place] [address]    Report Status 09/18/2019 FINAL  Final  SARS CORONAVIRUS 2 (TAT 6-24 HRS) Nasopharyngeal Nasopharyngeal Swab     Status: None   Collection Time: 09/17/19  5:49 PM   Specimen: Nasopharyngeal Swab  Result Value Ref Range Status   SARS Coronavirus 2 NEGATIVE NEGATIVE Final    Comment: (NOTE) SARS-CoV-2 target nucleic acids are NOT DETECTED. The SARS-CoV-2 RNA is generally detectable in upper and lower respiratory specimens during the acute phase of infection. Negative results do not preclude SARS-CoV-2 infection, do not rule out co-infections with other pathogens, and should not be used as the sole basis for treatment or other patient management decisions. Negative results must be combined with clinical observations, patient history, and epidemiological information. The expected result is  Negative. Fact Sheet for Patients: SugarRoll.be Fact Sheet for Healthcare Providers: https://www.-mathews.com/ This test is not yet approved or cleared by the [place] FDA and  has been authorized for detection and/or diagnosis of SARS-CoV-2 by FDA under an Emergency Use Authorization (EUA). This EUA will remain  in effect (meaning this test can be used) for the duration of the COVID-19 declaration under Section 56 4(b)(1) of the Act, 21 U.S.C. section 360bbb-3(b)(1), unless the authorization is terminated or revoked sooner. Performed at [facility], [place] [address], [place], [place] [address]   Culture, blood (routine x 2)     Status: None (Preliminary result)   Collection Time: 09/17/19  6:55 PM   Specimen: BLOOD RIGHT FOREARM  Result Value Ref Range Status   Specimen Description   Final    BLOOD RIGHT FOREARM Performed at [place] [address],  [place], [place] [address]    Special Requests   Final    BOTTLES DRAWN AEROBIC ONLY Blood Culture results may not be optimal due to an inadequate volume of blood received in culture bottles Performed at [place] [address], [place], [place] [address]    Culture   Final    NO GROWTH < 12 HOURS Performed at [place] [address] [place], [place] [address]    Report Status PENDING  Incomplete  Culture, blood (routine x 2)     Status: None (Preliminary result)   Collection Time: 09/17/19  6:55 PM   Specimen: BLOOD LEFT FOREARM  Result Value Ref Range Status   Specimen Description   Final    BLOOD LEFT FOREARM Performed at [place] [address] [place], [place] [address]    Special Requests   Final    BOTTLES DRAWN AEROBIC AND ANAEROBIC Blood Culture adequate volume Performed at [place] [address] [place], [place] [address]    Culture   Final    NO GROWTH < 12 HOURS Performed at [place] [address], [place],  [address]    Report Status PENDING  Incomplete         Radiology Studies: CT Head Wo Contrast  Result Date: 09/17/2019 CLINICAL DATA:  Altered mental status EXAM: CT HEAD WITHOUT CONTRAST TECHNIQUE: Contiguous axial images were obtained from the base of the skull through the vertex without intravenous contrast. COMPARISON:  08/23/2017 FINDINGS: Brain: No evidence of acute infarction, hemorrhage, hydrocephalus, extra-axial collection or mass lesion/mass effect. Scattered low-density changes within the periventricular and subcortical white matter compatible with chronic microvascular ischemic change. Mild diffuse cerebral volume loss. Vascular: Atherosclerotic calcifications involving the large vessels of the skull base. No unexpected hyperdense vessel. Skull: Normal. Negative for fracture or focal lesion. Sinuses/Orbits: No acute finding. Other: None. IMPRESSION: 1.   No acute intracranial findings. 2.  Chronic microvascular ischemic change and cerebral volume loss. Electronically Signed   By: Davina Poke D.O.   On: 09/17/2019 13:52   MR THORACIC SPINE WO CONTRAST  Result Date: 09/17/2019 CLINICAL DATA:  Mid back pain EXAM: MRI THORACIC SPINE WITHOUT CONTRAST TECHNIQUE: Multiplanar, multisequence MR imaging of the thoracic spine was performed. No intravenous contrast was administered. COMPARISON:  None. FINDINGS: The examination is degraded by motion. Axial gradient echo images were not obtained. Alignment: Physiologic. Vertebrae: No fracture, evidence of discitis, or bone lesion. Cord: There is an area of hyperintense T2-weighted signal within the lower thoracic spinal cord. The spinal  cord is otherwise poorly evaluated due to motion. Paraspinal and other soft tissues: Small left pleural effusion. Disc levels: There is degenerative disc disease at multiple lower thoracic levels without high-grade spinal canal stenosis. IMPRESSION: 1. Limited examination due to motion artifact and lack of IV contrast. 2. There is an area of hyperintense T2-weighted signal within the lower thoracic spinal cord. This is incompletely evaluated due to motion artifact and lack of IV contrast. Given the limitations of the study, a repeat examination with sedation or anesthesia might be helpful. 3. 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 Electronically Signed   By: Ulyses Jarred M.D.   On: 09/17/2019 22:35   MR Cervical Spine W or Wo Contrast  Result Date: 09/17/2019 CLINICAL DATA:  Initial evaluation for generalized weakness, inability to stand without assistance. EXAM: MRI CERVICAL SPINE WITHOUT AND WITH CONTRAST TECHNIQUE: Multiplanar and multiecho pulse sequences of the cervical spine, to include the craniocervical junction and cervicothoracic junction, were obtained without and with intravenous contrast. CONTRAST:  64mL  GADAVIST GADOBUTROL 1 MMOL/ML IV SOLN COMPARISON:  Prior MRI from 08/23/2017. FINDINGS: Alignment: Examination moderately degraded by motion artifact, somewhat limiting assessment. Straightening of the normal cervical lordosis. Trace 2 mm retrolisthesis of C2 on C3, with 2-3 mm anterolisthesis of C6 on C7 and C7 on T1. Findings chronic and facet mediated. Vertebrae: Susceptibility artifact from prior ACDF at C3-C6. Vertebral body height maintained without evidence for acute or chronic fracture. Bone marrow signal intensity within normal limits. No visible discrete or worrisome osseous lesions on the on this motion degraded exam. No abnormal marrow edema or enhancement. Cord: Probable patchy cord signal abnormality within the cervical spinal cord at the level of C3-4, consistent with chronic myelomalacia related to previously seen compressive stenosis. No other definite cord signal changes on this motion degraded exam. No abnormal enhancement. Posterior Fossa, vertebral arteries, paraspinal tissues: Visualized brain and posterior fossa within normal limits. Retro cerebellar cyst versus mega cisterna magna noted. Craniocervical junction within normal limits. Paraspinous and prevertebral soft tissues unremarkable. Normal intravascular flow voids seen within the vertebral arteries bilaterally. Disc levels: C2-C3: Retrolisthesis. Mild diffuse disc bulge. Right greater than left facet hypertrophy. No significant spinal stenosis or cord deformity. Moderate left with mild right C3 foraminal stenosis. C3-C4: Prior fusion. Residual posterior endplate osseous ridging indents the ventral thecal sac with persistent mild to moderate spinal stenosis. Bilateral facet hypertrophy. Persistent moderate to severe right worse than left C4 foraminal narrowing. Appearance is improved relative to previous exam. C4-C5: Prior fusion. Residual posterior endplate osseous ridging indents the ventral thecal sac with persistent mild spinal  stenosis. Moderate bilateral facet hypertrophy. Persistent moderate to severe bilateral foraminal narrowing. Appearance is slightly improved from previous exam. C5-C6: Prior fusion. Residual central endplate osseous ridging indents the ventral thecal sac, contacting and mildly flattening the cervical spinal cord. Persistent mild to moderate spinal stenosis, improved from previous. Bilateral facet hypertrophy with right greater than left uncinate spurring with resultant moderate to severe right worse than left C6 foraminal narrowing, also improved. C6-C7: Anterolisthesis. Mild disc bulge with bilateral uncovertebral hypertrophy. Flattening of the ventral thecal sac with no more than mild spinal stenosis. Superimposed bilateral facet hypertrophy. Moderate bilateral C7 foraminal narrowing, grossly stable. C7-T1: Anterolisthesis. Negative interspace. Bilateral facet hypertrophy. No significant spinal stenosis. Foramina remain patent. T1-2: Diffuse disc bulge. Posterior element hypertrophy. Resultant mild spinal stenosis with moderate left worse than right foraminal narrowing, grossly similar. IMPRESSION: 1. Technically limited exam  due to extensive motion artifact. 2. Postoperative changes from prior ACDF at C3-C6. Residual endplate osseous ridging with uncovertebral and facet hypertrophy at these levels, with persistent mild to moderate diffuse spinal stenosis with moderate to severe bilateral foraminal narrowing as above. Overall, appearance is improved relative to preoperative exam from 2019. 3. Patchy cord signal abnormality at the level of C3-4, consistent with chronic myelomalacia. Electronically Signed   By: Jeannine Boga M.D.   On: 09/17/2019 15:59   MR Lumbar Spine W Wo Contrast  Result Date: 09/18/2019 CLINICAL DATA:  Weakness and difficulty standing. Low back pain. EXAM: MRI LUMBAR SPINE WITHOUT AND WITH CONTRAST TECHNIQUE: Multiplanar and multiecho pulse sequences of the lumbar spine were obtained  without and with intravenous contrast. CONTRAST:  5mL GADAVIST GADOBUTROL 1 MMOL/ML IV SOLN COMPARISON:  07/24/2017 FINDINGS: Despite efforts by the technologist and patient, marked motion artifact is present on today's exam and could not be eliminated. This reduces exam sensitivity and specificity. Segmentation: The lowest lumbar type non-rib-bearing vertebra is labeled as L5. Alignment: 4 mm degenerative retrolisthesis at L2-3 and L3-4 as on prior exams. Vertebrae: Type 2 degenerative endplate findings at [id number] and L4-5 with type 1 degenerative endplate findings at [id number]. Disc desiccation and loss of disc height is present at all lumbar levels with scattered Schmorl's nodes. 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 Conus medullaris and cauda equina: Conus extends to the L1 level. There is no expansion of the conus. Questionable mild accentuated central signal in the distal thoracic cord, as suggested on prior thoracic spine MRI, although assessment is again limited by motion artifact. No appreciable abnormal enhancement of the cauda equina to suggest arachnoiditis. Paraspinal and other soft tissues: Bilateral renal fluid signal intensity lesions favor cysts. Disc levels: T12-L1: Mild displacement of the right T12 nerve in the lateral extraforaminal space due to right lateral extraforaminal disc protrusion. Right lateral recess disc protrusion is also noted. Roughly stable from prior. L1-2: Moderate central narrowing of the thecal sac due to disc bulge and facet arthropathy. AP diameter of the thecal sac is 0.7 cm. Minimally worsened from prior. L2-3: Prominent central narrowing of the thecal sac with borderline left foraminal stenosis and likely mild bilateral subarticular lateral recess stenosis due to disc bulge, intervertebral spurring, and facet arthropathy. AP diameter of the thecal sac is 0.5 cm. Minimally worsened from prior. L3-4: Prominent central narrowing of the thecal sac with  moderate to prominent right and mild left foraminal stenosis and right greater than left subarticular lateral recess stenosis due to intervertebral spurring, disc bulge, and facet arthropathy. AP diameter the thecal sac is 0.6 cm. Essentially stable from prior. L4-5: Moderate right and mild left foraminal stenosis with moderate central narrowing of the thecal sac due to right greater than left facet arthropathy, intervertebral spurring, and disc bulge. Appearance is stable from prior. L5-S1: Mild right and borderline left foraminal stenosis due to intervertebral and facet spurring along with mild disc bulge. Appearance is stable from prior. IMPRESSION: 1. Lumbar spondylosis, degenerative disc disease, and congenitally short pedicles causing prominent impingement at L2-3 and L3-4; moderate impingement at L1-2 and L4-5; and mild impingement at T12-L1 and L5-S1, as detailed above. Impingement at L1-2 and L2-3 is minimally worsened from the prior exam of 07/24/2017. 2. Questionable accentuated central T2 signal in the distal thoracic cord, as suggested on prior thoracic spine MRI, although assessment is limited by motion artifact. Electronically Signed   By: Van Clines M.D.   On:  09/18/2019 12:00   DG Chest Port 1 View  Result Date: 09/17/2019 CLINICAL DATA:  Generalized weakness. EXAM: PORTABLE CHEST 1 VIEW COMPARISON:  CT 12/07/2018 FINDINGS: Chronic cardiomegaly and aortic tortuosity. The right chest is clear. Left hemidiaphragm is elevated, versus the possible presence of a sub pulmonic effusion. There is volume loss at the left lung base. IMPRESSION: Elevated left hemidiaphragm with volume loss at the left lung base. Cannot rule a sub pulmonic effusion. Cardiomegaly and aortic atherosclerosis. Electronically Signed   By: Nelson Chimes M.D.   On: 09/17/2019 11:54   ECHOCARDIOGRAM COMPLETE  Result Date: 09/18/2019    ECHOCARDIOGRAM REPORT   Patient Name:   NIYAM NIKIRK [facility] Date of Exam: 09/18/2019 Medical Rec  #:  [id number]    Height:       67.0 in Accession #:    [id number]   Weight:       205.0 lb Date of Birth:  September 17, 1940    BSA:          2.044 m Patient Age:    46 years     BP:           153/86 mmHg Patient Gender: M            HR:           69 bpm. Exam Location:  Inpatient Procedure: 2D Echo, Cardiac Doppler and Color Doppler Indications:    R55 Syncope  History:        Patient has prior history of Echocardiogram examinations, most                 recent 01/19/2019. PAD and COPD; Risk Factors:Hypertension,                 Diabetes, Sleep Apnea and GERD. Thoracic Aortic Aneurysm.  Sonographer:    Jonelle Sidle Dance Referring Phys: :[id number] [place]  1. Normal LV systolic function; mild LVH; mild LAE; mildly dilated aortic root; mild AI.  2. Left ventricular ejection fraction, by estimation, is 55 to 60%. The left ventricle has normal function. The left ventricle has no regional wall motion abnormalities. There is mild left ventricular hypertrophy. Left ventricular diastolic parameters were normal.  3. Right ventricular systolic function is normal. The right ventricular size is normal.  4. Left atrial size was mildly dilated.  5. The mitral valve is normal in structure. No evidence of mitral valve regurgitation. No evidence of mitral stenosis.  6. The aortic valve is tricuspid. Aortic valve regurgitation is mild. Mild to moderate aortic valve sclerosis/calcification is present, without any evidence of aortic stenosis.  7. Aortic dilatation noted. There is mild dilatation of the aortic root measuring 40 mm.  8. The inferior vena cava is normal in size with greater than 50% respiratory variability, suggesting right atrial pressure of 3 mmHg. FINDINGS  Left Ventricle: Left ventricular ejection fraction, by estimation, is 55 to 60%. The left ventricle has normal function. The left ventricle has no regional wall motion abnormalities. The left ventricular internal cavity size was normal in size. There is  mild  left ventricular hypertrophy. Left ventricular diastolic parameters were normal. Right Ventricle: The right ventricular size is normal. Right ventricular systolic function is normal. Left Atrium: Left atrial size was mildly dilated. Right Atrium: Right atrial size was normal in size. Pericardium: There is no evidence of pericardial effusion. Mitral Valve: The mitral valve is normal in structure. Normal mobility of the mitral valve leaflets. Moderate mitral annular calcification. No evidence  of mitral valve regurgitation. No evidence of mitral valve stenosis. Tricuspid Valve: The tricuspid valve is normal in structure. Tricuspid valve regurgitation is trivial. No evidence of tricuspid stenosis. Aortic Valve: The aortic valve is tricuspid. Aortic valve regurgitation is mild. Aortic regurgitation PHT measures 427 msec. Mild to moderate aortic valve sclerosis/calcification is present, without any evidence of aortic stenosis. Pulmonic Valve: The pulmonic valve was not well visualized. Pulmonic valve regurgitation is not visualized. No evidence of pulmonic stenosis. Aorta: Aortic dilatation noted. There is mild dilatation of the aortic root measuring 40 mm. Venous: The inferior vena cava is normal in size with greater than 50% respiratory variability, suggesting right atrial pressure of 3 mmHg.  Additional Comments: Normal LV systolic function; mild LVH; mild LAE; mildly dilated aortic root; mild AI.  LEFT VENTRICLE PLAX 2D LVIDd:         5.20 cm  Diastology LVIDs:         4.10 cm  LV e' lateral:   10.30 cm/s LV PW:         1.40 cm  LV E/e' lateral: 9.7 LV IVS:        1.00 cm  LV e' medial:    8.49 cm/s LVOT diam:     2.30 cm  LV E/e' medial:  11.7 LV SV:         89 LV SV Index:   44 LVOT Area:     4.15 cm  RIGHT VENTRICLE             IVC RV Basal diam:  2.90 cm     IVC diam: 1.30 cm RV S prime:     12.70 cm/s TAPSE (M-mode): 2.2 cm LEFT ATRIUM             Index       RIGHT ATRIUM           Index LA diam:        4.30 cm  2.10 cm/m  RA Area:     15.90 cm LA Vol (A2C):   86.6 ml 42.38 ml/m RA Volume:   33.10 ml  16.20 ml/m LA Vol (A4C):   62.9 ml 30.78 ml/m LA Biplane Vol: 76.6 ml 37.48 ml/m  AORTIC VALVE LVOT Vmax:   119.00 cm/s LVOT Vmean:  71.000 cm/s LVOT VTI:    0.214 m AI PHT:      427 msec  AORTA Ao Root diam: 4.10 cm Ao Asc diam:  3.80 cm MITRAL VALVE MV Area (PHT): 4.39 cm    SHUNTS MV Decel Time: 173 msec    Systemic VTI:  0.21 m MV E velocity: 99.40 cm/s  Systemic Diam: 2.30 cm MV A velocity: 94.70 cm/s MV E/A ratio:  1.05 Kirk Ruths MD Electronically signed by Kirk Ruths MD Signature Date/Time: 09/18/2019/12:58:27 PM    Final         Scheduled Meds: . allopurinol  300 mg Oral Daily  . aspirin EC  81 mg Oral Daily  . baclofen  10 mg Oral TID  . carbidopa-levodopa  1 tablet Oral BID  . colchicine  0.6 mg Oral Daily  . enoxaparin (LOVENOX) injection  40 mg Subcutaneous Daily  . fluticasone furoate-vilanterol  1 puff Inhalation Daily  . gabapentin  100 mg Oral QHS  . insulin aspart  0-20 Units Subcutaneous Q4H  . latanoprost  1 drop Both Eyes QHS  . levETIRAcetam  250 mg Oral BID  . levothyroxine  125 mcg Oral QAC breakfast  .  LORazepam  2 mg Intravenous Once  . methylPREDNISolone (SOLU-MEDROL) injection  60 mg Intravenous Q8H  .  morphine injection  4 mg Intravenous Once  . multivitamin with minerals  1 tablet Oral Daily  . ondansetron (ZOFRAN) IV  4 mg Intravenous Once  . pantoprazole  40 mg Oral QPM  . simvastatin  40 mg Oral q1800  . umeclidinium bromide  1 puff Inhalation Daily   Continuous Infusions: . sodium chloride 50 mL/hr at 09/18/19 1500     LOS: 2 days    Time spent:40 min    Janesa Dockery, Geraldo Docker, MD [facility] Pager [phone]  If 7PM-7AM, please contact night-coverage [email or web address] Password [facility] 09/19/2019, 9:34 AM

## 2019-09-19 NOTE — Care Management Important Message (Signed)
Important Message  Patient Details IM Letter given to Roque Lias SW Case Manager to present to the Patient Name: [name] MRN: [id number] Date of Birth: 1940/12/21   Medicare Important Message Given:  Yes     Kerin Salen 09/19/2019, 9:56 AM

## 2019-09-20 LAB — COMPREHENSIVE METABOLIC PANEL
ALT: 9 U/L (ref 0–44)
AST: 15 U/L (ref 15–41)
Albumin: 3.5 g/dL (ref 3.5–5.0)
Alkaline Phosphatase: 55 U/L (ref 38–126)
Anion gap: 9 (ref 5–15)
BUN: 31 mg/dL — ABNORMAL HIGH (ref 8–23)
CO2: 26 mmol/L (ref 22–32)
Calcium: 8.5 mg/dL — ABNORMAL LOW (ref 8.9–10.3)
Chloride: 106 mmol/L (ref 98–111)
Creatinine, Ser: 0.92 mg/dL (ref 0.61–1.24)
GFR calc Af Amer: >60 mL/min (ref >60)
GFR calc non Af Amer: >60 mL/min (ref >60)
Glucose, Bld: 140 mg/dL — ABNORMAL HIGH (ref 70–99)
Potassium: 4.6 mmol/L (ref 3.5–5.1)
Sodium: 141 mmol/L (ref 135–145)
Total Bilirubin: 0.4 mg/dL (ref 0.3–1.2)
Total Protein: 6.4 g/dL — ABNORMAL LOW (ref 6.5–8.1)

## 2019-09-20 LAB — CBC WITH DIFFERENTIAL/PLATELET
Abs Immature Granulocytes: 0.05 10*3/uL (ref 0.00–0.07)
Basophils Absolute: 0 10*3/uL (ref 0.0–0.1)
Basophils Relative: 0 %
Eosinophils Absolute: 0 10*3/uL (ref 0.0–0.5)
Eosinophils Relative: 0 %
HCT: 38.1 % — ABNORMAL LOW (ref 39.0–52.0)
Hemoglobin: 12.3 g/dL — ABNORMAL LOW (ref 13.0–17.0)
Immature Granulocytes: 0 %
Lymphocytes Relative: 7 %
Lymphs Abs: 0.9 10*3/uL (ref 0.7–4.0)
MCH: 29.7 pg (ref 26.0–34.0)
MCHC: 32.3 g/dL (ref 30.0–36.0)
MCV: 92 fL (ref 80.0–100.0)
Monocytes Absolute: 0.5 10*3/uL (ref 0.1–1.0)
Monocytes Relative: 4 %
Neutro Abs: 10.2 10*3/uL — ABNORMAL HIGH (ref 1.7–7.7)
Neutrophils Relative %: 89 %
Platelets: 231 10*3/uL (ref 150–400)
RBC: 4.14 MIL/uL — ABNORMAL LOW (ref 4.22–5.81)
RDW: 14.1 % (ref 11.5–15.5)
WBC: 11.6 10*3/uL — ABNORMAL HIGH (ref 4.0–10.5)
nRBC: 0 % (ref 0.0–0.2)

## 2019-09-20 LAB — GLUCOSE, CAPILLARY
Glucose-Capillary: 124 mg/dL — ABNORMAL HIGH (ref 70–99)
Glucose-Capillary: 125 mg/dL — ABNORMAL HIGH (ref 70–99)
Glucose-Capillary: 129 mg/dL — ABNORMAL HIGH (ref 70–99)
Glucose-Capillary: 161 mg/dL — ABNORMAL HIGH (ref 70–99)
Glucose-Capillary: 167 mg/dL — ABNORMAL HIGH (ref 70–99)
Glucose-Capillary: 184 mg/dL — ABNORMAL HIGH (ref 70–99)

## 2019-09-20 LAB — PHOSPHORUS: Phosphorus: 3 mg/dL (ref 2.5–4.6)

## 2019-09-20 LAB — MAGNESIUM: Magnesium: 2.3 mg/dL (ref 1.7–2.4)

## 2019-09-20 NOTE — Progress Notes (Signed)
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  Patient was admitted with generalized fatigue, severe, limiting mobility around the house.  Denied any fall or injury. Patient has chronic low back pain with radiation down the left leg that he does not think is significantly changed. Denied cough denies congestion denies fever. Denied loss of bowel or bladder denies loss peritoneal sensation. Has chronic numbness and weakness to his left leg that is unchanged. Denied recent head injury.   At baseline (Prior to onset of current problems), patient reported being able to ambulate short distances with walker.  Chronic LEFT hemiparesis.  Chronic L LE spasm, stated approximately 2 weeks prior to presentation began to have increased L LE weakness with pain.  On the day of presentation, patient woke and was unable to ambulate at all.  Increased L LE pain/decreased sensation lateral aspect of L LE.  States negative increased back pain.  Negative increased stool/urine incontinence.   Subjective: Seen alongside patient's wife.  Patient's wife reported that patient has had chronic spinal cord problem involving the neck and the right side of the body.  Patient's wife reported that patient now has limitation involving the left side.  Patient's wife understands that patient is not a surgical candidate.  The plan is to proceed with  rehabilitation.  Physical therapy team is currently assisting patient.  No significant changes.  No significant history from the patient.  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 [facility])   Essential hypertension   PVD (peripheral vascular disease) (HCC)   Chronic diastolic CHF (congestive heart failure) (HCC)   Diabetes mellitus type 2, controlled, with complications ([place])   Obese   Thoracic aortic aneurysm (HCC)   Bowel incontinence   Bladder incontinence   Acute renal failure (ARF) (HCC)   Left hemiparesis (HCC)   Chronic respiratory failure with hypoxia (HCC)   Syncope and collapse   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    Acute on chronic lower extremity weakness. -Per patient usually can ambulate short distance with walker.  Starting today unable to ambulate at all, with increased LLE pain -MRI T-spine/L-spine; shows significant chronic damage, as well as acute changes see results below.  -4/2 with initiation of high-dose steroids decreased weakness, and muscle spasms. 09/20/2019: Posterior inpatient rehab.  Acute on chronic nerve root impingement L-spine -Although patient extremely poor candidate for any surgical intervention, (acute on chronic), multiple comorbid conditions), will contact neurosurgery in the A.m. -4/2 discussed case with Dr. Kristeen Miss neurosurgery who reviewed all MRI scans agrees patient patient is extremely poor  candidate for any surgical procedure.  Agrees medical management -Solu-Medrol IV 60 mg TID x7 days.  Then transition to  prednisone p.o. taper. -4/2 increase baclofen 15 mg TID -4/2 OxyContin 10 mg BID -Continue Vicodin PRN  LLE hypoesthesia -Per patient 3/31 began to have lateral leg loss of sensation.  Bowel/bladder incontinence -Chronic.  Per patient stable   Chronic diastolic CHF? -Per last echocardiogram CHF indeterminate.  However patient on multiple medications consistent with CHF. -3/31 echocardiogram; normal echocardiogram CHF RULED OUT -Strict in and out +1.2 L -Daily weight Filed Weights   09/17/19 1108 09/19/19 0549 09/20/19 0500  Weight: 93 kg 92.3 kg 93.9 kg  -Monitor closely for fluid overload -Hold all BP medication patient's BP borderline  Essential HTN -See CHF  THORACIC ascending aortic aneurysm (per past medical history in chart) -Patient states unaware of this condition -08/18/2017 reported as 4.2 cm -MRI of thoracic spine should show any enlargement.  Per guidelines patient should have yearly evaluation.    Chronic respiratory failure with hypoxia -Patient on 2 L O2 at home -Titrate O2 to maintain SPO2> 88% -Albuterol q 4hr as needed -Fluticasone-Umeclidin-Vilant 100-60 2.5-25 mcg/INH daily -DuoNeb QID PRN  OSA/OHS -CPAP per respiratory  Acute renal failure (baseline Cr 0.87) -Normal saline 70ml/hr  DM type II controlled with complication -Hold home medications secondary to acute renal failure -4/1 increase resistant SSI (on steroids)   L LE DVT? -Patient with left calf swelling and pain to palpation; D-dimer negative.  No further work-up required at this point   Goals of care -4/2 PT/OT consult; Evaluate for CIR (preferred) vs SNF   DVT prophylaxis: Lovenox Code Status: Full Family Communication: 4/2 wife at bedside counseled on plan of care answered all questions Disposition Plan: TBD. 1.  Where the patient is from 2.  Anticipated d/c place. 3.  Barriers to d/c resolution of acute neurologic deficit.  And patient will need inpatient rehab most  likely.   Consultants:  Neurosurgery phone consult Dr. Kristeen Miss  Procedures/Significant Events:  3/31 CT head Wo contrast;No evidence of acute infarction, hemorrhage, hydrocephalus, extra-axial collection or mass lesion/mass effect. Scattered low-density changes within the periventricular and subcortical white matter compatible with chronic microvascular ischemic change. Mild diffuse cerebral volume loss.  3/31 MRI C-spine W/WO contrast; -Postoperative changes from prior ACDF at C3-C6. -mild to moderate diffuse spinal stenosis with moderate to severe bilateral foraminal narrowing  Overall, appearance is improved relative to preoperative exam from 2019. -Patchy cord signal abnormality at the level of C3-4, consistent with chronic myelomalacia. -Trace 2 mm retrolisthesis of C2 on C3, with 2-3 mm anterolisthesis of C6 on C7 and C7 on T1. Findings chronic and facet mediated. -Cord: Probable patchy cord signal abnormality within the cervical spinal cord at the level of C3-4, consistent with chronic myelomalacia related to previously seen compressive stenosis.  -C2-C3: Retrolisthesis. Mild diffuse disc bulge. Right>>LEFT. Moderate left with mild right C3 foraminal stenosis. -C3-C4: Prior fusion. Residual posterior endplate osseous ridging indents the ventral thecal sac with persistent mild to moderate spinal stenosis.  -Persistent moderate to severe right worse than left C4 foraminal narrowing. Appearance is improved relative to previous exam. -C4-C5: Prior fusion. Residual posterior endplate osseous ridging indents the ventral thecal sac with persistent mild spinal stenosis. -Moderate bilateral facet hypertrophy. Persistent moderate to severe bilateral foraminal narrowing. Appearance is slightly improved from previous exam. -C5-C6: Prior fusion. Residual central endplate osseous ridging indents the ventral thecal sac, contacting and mildly flattening the cervical spinal cord. Persistent  mild to moderate spinal stenosis,improved from previous.  -Moderate to severe right worse than left C6 foraminal narrowing, also improved. -C6-C7: Anterolisthesis. Mild disc bulge with bilateral uncovertebral hypertrophy.  -Moderate bilateral C7 foraminal narrowing, grossly stable. -C7-T1: Anterolisthesis. Negative interspace. Bilateral facet hypertrophy. No significant spinal stenosis. Foramina remain patent. -T1-2: Diffuse disc bulge. Posterior element hypertrophy. Resultant mild spinal stenosis with moderate left worse than right foraminal narrowing, grossly similar. 3/31 MRI T-spine;- area of hyperintense T2-weighted signal within the lower thoracic spinal cord.Incompletely evaluated due to motion artifact and lack of IV contrast.  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 4/1 MRI L-spine;-Lumbar spondylosis, degenerative disc disease, and congenitally short pedicles causing prominent impingement at L2-3 and L3-4; -Moderate impingement at L1-2 and L4-5; and  -Mild impingement T12-L1 and L5-S1,  -Impingement at L1-2 and L2-3 is minimally worsened from the prior exam of 07/24/2017. -Questionable accentuated central T2 signal in the distal thoracic cord, as suggested on prior thoracic spine MRI 4/2 Echocardiogram;LVEF= 55 to 60%.  -Normal left ventricle diastolic parameters.    Continuous Infusions: . sodium chloride 50 mL/hr at 09/20/19 0607    Objective: Vitals:   09/19/19 2153 09/20/19 0500 09/20/19 0543 09/20/19 0805  BP: 139/65  130/73   Pulse: 83  80   Resp: 17  16   Temp: 97.9 F (36.6 C)  98.4 F (36.9 C)   TempSrc: Oral  Oral   SpO2: 93%  96% 91%  Weight:  93.9 kg    Height:        Intake/Output Summary (Last 24 hours) at 09/20/2019 1308 Last data filed at 09/20/2019 1026 Gross per 24 hour  Intake 780 ml  Output 750 ml  Net 30 ml   Filed Weights   09/17/19 1108 09/19/19 0549 09/20/19 0500  Weight:  93 kg 92.3 kg 93.9 kg   Physical Exam:  General: Not in any distress.  Patient is awake and alert. Eyes: No pallor or jaundice. Neck: No raised JVD. Lungs: Clear to auscultation. Cardiovascular:  S1 and S2 Abdomen: OBESE, soft and nontender.  Organs at difficult to assess.   Central nervous system: Awake and alert.  Patient moves all extremities.  Data Reviewed: Care during the described time interval was provided by me .  I have reviewed this patient's available data, including medical history, events of note, physical examination, and all test results as part of my evaluation.  CBC: Recent Labs  Lab 09/17/19 1111 09/17/19 2029 09/19/19 0509 09/20/19 0533  WBC 13.1* 9.8 8.1 11.6*  NEUTROABS 10.3*  --  7.4 10.2*  HGB 13.3 12.6* 13.8 12.3*  HCT 40.5 38.2* 42.7 38.1*  MCV 91.2 91.4 93.8 92.0  PLT 214 211 218 [id number]   Basic Metabolic Panel: Recent Labs  Lab 09/17/19 1111 09/17/19 2029 09/18/19 0747 09/19/19 0509 09/20/19 0533  NA 138  --  140 141 141  K 4.8  --  4.6 4.6 4.6  CL 101  --  103 106 106  CO2 27  --  27 25 26   GLUCOSE 109*  --  99 148* 140*  BUN 43*  --  26* 23 31*  CREATININE 1.64* 1.26* 1.18 0.93 0.92  CALCIUM 9.3  --  8.9 9.1 8.5*  MG  --   --  2.4 2.2 2.3  PHOS  --   --  4.2 4.1 3.0   GFR: Estimated Creatinine Clearance: 72.3 mL/min (by C-G formula based on SCr of 0.92 mg/dL). Liver  Function Tests: Recent Labs  Lab 09/17/19 1111 09/18/19 0747 09/19/19 0509 09/20/19 0533  AST 21 16 14* 15  ALT 10 8 10 9   ALKPHOS 61 62 63 55  BILITOT 0.6 0.8 0.7 0.4  PROT 7.0 6.9 7.1 6.4*  ALBUMIN 3.9 3.8 3.8 3.5   No results for input(s): LIPASE, AMYLASE in the last 168 hours. No results for input(s): AMMONIA in the last 168 hours. Coagulation Profile: No results for input(s): INR, PROTIME in the last 168 hours. Cardiac Enzymes: No results for input(s): CKTOTAL, CKMB, CKMBINDEX, TROPONINI in the last 168 hours. BNP (last 3 results) Recent Labs     02/14/19 1001  PROBNP 482   HbA1C: Recent Labs    09/17/19 2029  HGBA1C 5.8*   CBG: Recent Labs  Lab 09/19/19 2150 09/19/19 2341 09/20/19 0413 09/20/19 0719 09/20/19 1148  GLUCAP 202* 185* 129* 125* 167*   Lipid Profile: No results for input(s): CHOL, HDL, LDLCALC, TRIG, CHOLHDL, LDLDIRECT in the last 72 hours. Thyroid Function Tests: Recent Labs    09/17/19 2029  TSH 0.223*   Anemia Panel: No results for input(s): VITAMINB12, FOLATE, FERRITIN, TIBC, IRON, RETICCTPCT in the last 72 hours. Sepsis Labs: No results for input(s): PROCALCITON, LATICACIDVEN in the last 168 hours.  Recent Results (from the past 240 hour(s))  Culture, Urine     Status: Abnormal   Collection Time: 09/17/19  1:05 PM   Specimen: Urine, Clean Catch  Result Value Ref Range Status   Specimen Description   Final    URINE, CLEAN CATCH Performed at [facility], [place] [address], [place], [place] [address]    Special Requests   Final    NONE Performed at [facility], [place] [address], [place], [place] [address]    Culture (A)  Final    30,000 COLONIES/mL DIPHTHEROIDS(CORYNEBACTERIUM SPECIES) Standardized susceptibility testing for this organism is not available. Performed at [facility], [place] [address] [place], [place] [address]    Report Status 09/18/2019 FINAL  Final  SARS CORONAVIRUS 2 (TAT 6-24 HRS) Nasopharyngeal Nasopharyngeal Swab     Status: None   Collection Time: 09/17/19  5:49 PM   Specimen: Nasopharyngeal Swab  Result Value Ref Range Status   SARS Coronavirus 2 NEGATIVE NEGATIVE Final    Comment: (NOTE) SARS-CoV-2 target nucleic acids are NOT DETECTED. The SARS-CoV-2 RNA is generally detectable in upper and lower respiratory specimens during the acute phase of infection. Negative results do not preclude SARS-CoV-2 infection, do not rule out co-infections with other pathogens, and should not be used as the sole basis for treatment or  other patient management decisions. Negative results must be combined with clinical observations, patient history, and epidemiological information. The expected result is Negative. Fact Sheet for Patients: SugarRoll.be Fact Sheet for Healthcare Providers: https://www.woods-mathews.com/ This test is not yet approved or cleared by the [place] FDA and  has been authorized for detection and/or diagnosis of SARS-CoV-2 by FDA under an Emergency Use Authorization (EUA). This EUA will remain  in effect (meaning this test can be used) for the duration of the COVID-19 declaration under Section 56 4(b)(1) of the Act, 21 U.S.C. section 360bbb-3(b)(1), unless the authorization is terminated or revoked sooner. Performed at [facility], [place] [address] [place], [place] [address]   Culture, blood (routine x 2)     Status: None (Preliminary result)   Collection Time: 09/17/19  6:55 PM   Specimen: BLOOD RIGHT FOREARM  Result Value  Ref Range Status   Specimen Description   Final    BLOOD RIGHT FOREARM Performed at [facility] [address] [place], [place] [address]    Special Requests   Final    BOTTLES DRAWN AEROBIC ONLY Blood Culture results may not be optimal due to an inadequate volume of blood received in culture bottles Performed at [facility] [address] [place], [place] [address]    Culture   Final    NO GROWTH 2 DAYS Performed at [facility] [address], [place], [place] [address]    Report Status PENDING  Incomplete  Culture, blood (routine x 2)     Status: None (Preliminary result)   Collection Time: 09/17/19  6:55 PM   Specimen: BLOOD LEFT FOREARM  Result Value Ref Range Status   Specimen Description   Final    BLOOD LEFT FOREARM Performed at [facility] [address] [place], [place] [address]    Special Requests   Final    BOTTLES DRAWN  AEROBIC AND ANAEROBIC Blood Culture adequate volume Performed at [facility] [address], [place], [place] [address]    Culture   Final    NO GROWTH 2 DAYS Performed at [facility] [address] [place], [place] [address]    Report Status PENDING  Incomplete         Radiology Studies: No results found.      Scheduled Meds: . allopurinol  300 mg Oral Daily  . aspirin EC  81 mg Oral Daily  . baclofen  15 mg Oral TID  . carbidopa-levodopa  1 tablet Oral BID  . colchicine  0.6 mg Oral Daily  . enoxaparin (LOVENOX) injection  40 mg Subcutaneous Daily  . fluticasone furoate-vilanterol  1 puff Inhalation Daily  . gabapentin  100 mg Oral QHS  . insulin aspart  0-20 Units Subcutaneous Q4H  . latanoprost  1 drop Both Eyes QHS  . levETIRAcetam  250 mg Oral BID  . levothyroxine  125 mcg Oral QAC breakfast  . LORazepam  2 mg Intravenous Once  . methylPREDNISolone (SOLU-MEDROL) injection  60 mg Intravenous Q8H  .  morphine injection  4 mg Intravenous Once  . multivitamin with minerals  1 tablet Oral Daily  . ondansetron (ZOFRAN) IV  4 mg Intravenous Once  . oxyCODONE  10 mg Oral Q12H  . pantoprazole  40 mg Oral QPM  . simvastatin  40 mg Oral q1800  . umeclidinium bromide  1 puff Inhalation Daily   Continuous Infusions: . sodium chloride 50 mL/hr at 09/20/19 0607     LOS: 3 days   Time spent:35 min  Bonnell Public, MD [facility]  If 7PM-7AM, please contact night-coverage [email or web address] Password [email or web address] 09/20/2019, 1:08 PM

## 2019-09-20 NOTE — Evaluation (Signed)
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   Clinical Impression   PTA Pt was set up and use of AE for bathing (assist for tub transfer with shower chair), mod A for LB dressing, utilizing a WC for long distances - but performing transfers with assist from PCA. PCA does all IADL. Pt is highly motivated and would like to progress to using RW more. Today Pt was able to perform bed mobility at mod A +2, transfers x1 with mod A +2 HHA. Today required [facility] for pivot. Max A for LB dressing, and set up once in recliner for grooming. At this time recommending CIR level therapy as Pt is eager for intensive rehab, he has potential to improve his functional performance outcomes - excellent support in home environment with wife and PCA, and to maximize safety and independence in ADL and functional transfers. OT will continue to follow acutely.     Follow Up Recommendations  CIR;Supervision/Assistance - 24 hour    Equipment Recommendations  None recommended by OT(Pt has appropriate DME)    Recommendations for Other Services Rehab consult     Precautions / Restrictions Precautions Precautions: Fall Restrictions Weight Bearing Restrictions: No      Mobility Bed Mobility Overal bed mobility: Needs Assistance Bed Mobility: Supine to Sit     Supine to sit: Mod  assist;+2 for physical assistance;+2 for safety/equipment     General bed mobility comments: Pt giving excellent effort, able to walk feet off bed, mod A +2 for trunk elevation and assist for hips EOB (Pt assisting with scoot). Pt does require extra time for all aspects of bed mobility  Transfers Overall transfer level: Needs assistance Equipment used: 2 person hand held assist Transfers: Sit to/from Stand Sit to Stand: Mod assist;+2 physical assistance;+2 safety/equipment;From elevated surface         General transfer comment: bed highly elevated (states bed at home is very high), mod A +2 for boost and balance - greatly improved with STEDY    Balance Overall balance assessment: Needs assistance Sitting-balance support: No upper extremity supported;Feet supported Sitting balance-Leahy Scale: Good Sitting balance - Comments: EOB - unchallenged with dynamic movement   Standing balance support: Bilateral upper extremity supported Standing balance-Leahy Scale: Poor Standing balance comment: dependent on BUE for standing balance                           ADL either performed or assessed with clinical judgement   ADL Overall ADL's : Needs assistance/impaired Eating/Feeding: Set up;Sitting   Grooming: Set up;Wash/dry hands;Wash/dry face;Sitting Grooming Details (indicate cue type and reason): EOB Upper Body Bathing: Moderate assistance;Sitting Upper Body Bathing Details (indicate cue type and reason): for back Lower Body Bathing: Moderate assistance;Sitting/lateral leans Lower Body Bathing Details (indicate cue type and reason): assist for knees down - typically used LHS Upper Body Dressing : Minimal assistance   Lower Body Dressing: Maximal assistance   Toilet Transfer: Moderate assistance;+2 for physical assistance;+2 for safety/equipment(STEDY) Toilet  Transfer Details (indicate cue type and reason): use of stedy to facilitate, Pt unable to pivot at this time -  highly elevated bed for sit<>stand Toileting- Clothing Manipulation and Hygiene: Total assistance Toileting - Clothing Manipulation Details (indicate cue type and reason): typically in adult diapers     Functional mobility during ADLs: Total assistance(STEDY) General ADL Comments: decreased strength and activity tolerance     Vision Patient Visual Report: No change from baseline       Perception     Praxis      Pertinent Vitals/Pain Pain Assessment: Faces Faces Pain Scale: Hurts little more Pain Location: LLE with spasms Pain Descriptors / Indicators: Discomfort;Grimacing;Spasm Pain Intervention(s): Limited activity within patient's tolerance;Monitored during session;Repositioned     Hand Dominance Right   Extremity/Trunk Assessment Upper Extremity Assessment Upper Extremity Assessment: Generalized weakness   Lower Extremity Assessment Lower Extremity Assessment: Defer to PT evaluation   Cervical / Trunk Assessment Cervical / Trunk Assessment: Other exceptions Cervical / Trunk Exceptions: history of chronic back pain   Communication Communication Communication: No difficulties   Cognition Arousal/Alertness: Awake/alert Behavior During Therapy: WFL for tasks assessed/performed Overall Cognitive Status: Within Functional Limits for tasks assessed                                     General Comments       Exercises     Shoulder Instructions      Home Living Family/patient expects to be discharged to:: Private residence Living Arrangements: Spouse/significant other Available Help at Discharge: Family;Available 24 hours/day;Personal care attendant(PCA 7-10 am and pm 7 days a week) Type of Home: House Home Access: Ramped entrance     Home Layout: One level     Bathroom Shower/Tub: Occupational psychologist: Handicapped height Bathroom Accessibility: Yes How Accessible: Accessible via wheelchair Home Equipment: [place] - 2  wheels;Wheelchair - [employer];Shower seat;Grab bars - tub/shower;Other (comment);Hand held shower head;Adaptive equipment(Hoyer Lift) Adaptive Equipment: Long-handled sponge Additional Comments: PCA assists with bed mobility and dressing (transfers)      Prior Functioning/Environment Level of Independence: Needs assistance  Gait / Transfers Assistance Needed: PCA assists with bed mobility, transfers (SPT) ADL's / Homemaking Assistance Needed: assist for dressing (including adult diaper changes), transfers for tub with shower chair, does not do IADL   Comments: Pt in diaper all the time, PCA assists with transfers and dressing (knees down) but is set up for bathing at baseline (has AE)        OT Problem List: Decreased activity tolerance;Impaired balance (sitting and/or standing);Decreased strength;Obesity;Pain;Decreased safety awareness      OT Treatment/Interventions: Self-care/ADL training;Therapeutic exercise;Energy conservation;DME and/or AE instruction;Therapeutic activities;Patient/family education;Balance training    OT Goals(Current goals can be found in the care plan section) Acute Rehab OT Goals Patient Stated Goal: To get to CIR and maximize independence OT Goal Formulation: With patient Time For Goal Achievement: 10/04/19 Potential to Achieve Goals: Good ADL Goals Pt Will Perform Grooming: with modified independence;sitting Pt Will Perform Upper Body Bathing: sitting;with adaptive equipment;with supervision Pt Will Perform Lower Body Bathing: with adaptive equipment;sitting/lateral leans;with supervision Pt Will Perform Upper Body Dressing: with set-up;with caregiver independent in assisting;sitting Pt Will Perform Lower Body Dressing: with mod assist;with caregiver independent in assisting;sit to/from stand Pt Will Transfer to Toilet: with mod assist;squat pivot transfer;bedside commode Pt Will Perform Toileting - Clothing Manipulation and hygiene: with mod  assist;sit to/from stand  Pt Will Perform Tub/Shower Transfer: Tub transfer;with mod assist;with caregiver independent in assisting;shower seat  OT Frequency: Min 2X/week   Barriers to D/C:            Co-evaluation PT/OT/SLP Co-Evaluation/Treatment: Yes Reason for Co-Treatment: For patient/therapist safety;To address functional/ADL transfers PT goals addressed during session: Mobility/safety with mobility;Balance;Proper use of DME OT goals addressed during session: ADL's and self-care;Proper use of Adaptive equipment and DME      AM-PAC OT "6 Clicks" Daily Activity     Outcome Measure Help from another person eating meals?: A Little Help from another person taking care of personal grooming?: A Little Help from another person toileting, which includes using toliet, bedpan, or urinal?: A Lot Help from another person bathing (including washing, rinsing, drying)?: A Lot Help from another person to put on and taking off regular upper body clothing?: A Little Help from another person to put on and taking off regular lower body clothing?: A Lot 6 Click Score: 15   End of Session Equipment Utilized During Treatment: Gait belt;Oxygen;Rolling walker(2L, Charlaine Dalton) Nurse Communication: Mobility status;Need for lift equipment;Precautions  Activity Tolerance: Patient tolerated treatment well Patient left: in chair;with call bell/phone within reach;with chair alarm set  OT Visit Diagnosis: Unsteadiness on feet (R26.81);Other abnormalities of gait and mobility (R26.89);Muscle weakness (generalized) (M62.81);Pain Pain - Right/Left: Left Pain - part of body: Leg                Time: [id number] OT Time Calculation (min): 33 min Charges:  OT General Charges $OT Visit: 1 Visit OT Evaluation $OT Eval Moderate Complexity: [place] OTR/L Acute Rehabilitation Services Pager: [phone] Office: [place] 09/20/2019, 1:46 PM

## 2019-09-20 NOTE — Evaluation (Signed)
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  Clinical Impression  Pt admitted as above and presenting with functional mobility limitations 2* generalized weakness most notably LEs, balance deficits and limited endurance.  Pt is highly motivated to regain as much IND as possible and would benefit greatly from follow up intensive PT/OT at CIR level.    Follow Up Recommendations CIR    Equipment Recommendations  None recommended by PT    Recommendations for Other Services       Precautions / Restrictions Precautions Precautions: Fall Restrictions Weight Bearing Restrictions: No      Mobility  Bed Mobility Overal bed mobility: Needs Assistance Bed Mobility: Supine to Sit     Supine to sit: Mod assist;+2 for physical assistance;+2 for safety/equipment     General bed mobility comments: Pt giving excellent effort, able to walk feet off bed, mod A +2 for trunk elevation and assist for hips EOB (Pt assisting with scoot). Pt does require extra time for all aspects of bed mobility  Transfers Overall transfer level: Needs assistance Equipment used: Rolling walker (2 wheeled) Transfers: Sit to/from Stand Sit to Stand: Mod assist;+2 physical assistance;+2 safety/equipment;From elevated surface         General transfer comment: bed highly elevated (states  bed at home is very high), mod A +2 for boost and balance - Pt stood initially with RW but with ltd WB bilat LEs - greatly improved with STEDY  Ambulation/Gait             General Gait Details: Pt stood only - unable to initiate step with either LE  Stairs            Wheelchair Mobility    Modified Rankin (Stroke Patients Only)       Balance Overall balance assessment: Needs assistance Sitting-balance support: No upper extremity supported;Feet supported Sitting balance-Leahy Scale: Good Sitting balance - Comments: EOB - unchallenged with dynamic movement   Standing balance support: Bilateral upper extremity supported Standing balance-Leahy Scale: Poor Standing balance comment: dependent on BUE for standing balance                             Pertinent Vitals/Pain Pain Assessment: Faces Faces Pain Scale: Hurts little more Pain Location: LLE with spasms Pain Descriptors / Indicators: Discomfort;Grimacing;Spasm Pain Intervention(s): Limited activity within patient's tolerance;Monitored during session;Repositioned    Home Living Family/patient expects to be discharged to:: Private residence Living Arrangements: Spouse/significant other Available Help at Discharge: Family;Available 24 hours/day;Personal care attendant Type of Home: House Home Access: Ramped entrance     Home Layout: One level Home Equipment: [place] - 2 wheels;Wheelchair - [employer];Shower seat;Grab bars - tub/shower;Other (comment);Hand held shower head;Adaptive equipment Additional Comments: PCA assists with bed mobility and dressing (transfers)    Prior Function Level of Independence: Needs assistance   Gait / Transfers Assistance Needed: PCA assists with bed mobility, transfers (SPT)  ADL's / Homemaking Assistance Needed: assist for dressing (including adult diaper  changes), transfers for tub with shower chair, does not do IADL  Comments: Pt in diaper all the time,  PCA assists with transfers and dressing (knees down) but is set up for bathing at baseline (has AE)     Hand Dominance   Dominant Hand: Right    Extremity/Trunk Assessment   Upper Extremity Assessment Upper Extremity Assessment: Generalized weakness    Lower Extremity Assessment Lower Extremity Assessment: Generalized weakness;RLE deficits/detail;LLE deficits/detail RLE Deficits / Details: Pt reports intermittent spasms  LLE Deficits / Details: Intermittent spasms noted - pt states bandage is on knee 2* injury caused by spasm    Cervical / Trunk Assessment Cervical / Trunk Assessment: Other exceptions Cervical / Trunk Exceptions: history of chronic back pain  Communication   Communication: No difficulties  Cognition Arousal/Alertness: Awake/alert Behavior During Therapy: WFL for tasks assessed/performed Overall Cognitive Status: Within Functional Limits for tasks assessed                                        General Comments      Exercises     Assessment/Plan    PT Assessment Patient needs continued PT services  PT Problem List Decreased strength;Decreased range of motion;Decreased activity tolerance;Decreased balance;Decreased mobility;Decreased knowledge of use of DME;Obesity;Pain       PT Treatment Interventions DME instruction;Gait training;Functional mobility training;Therapeutic activities;Therapeutic exercise;Balance training;Patient/family education    PT Goals (Current goals can be found in the Care Plan section)  Acute Rehab PT Goals Patient Stated Goal: To get to CIR and maximize independence PT Goal Formulation: With patient Time For Goal Achievement: 10/04/19 Potential to Achieve Goals: Fair    Frequency Min 3X/week   Barriers to discharge        Co-evaluation PT/OT/SLP Co-Evaluation/Treatment: Yes Reason for Co-Treatment: For patient/therapist safety PT goals addressed during session: Mobility/safety with mobility OT goals  addressed during session: ADL's and self-care       AM-PAC PT "6 Clicks" Mobility  Outcome Measure Help needed turning from your back to your side while in a flat bed without using bedrails?: A Little Help needed moving from lying on your back to sitting on the side of a flat bed without using bedrails?: A Lot Help needed moving to and from a bed to a chair (including a wheelchair)?: A Lot Help needed standing up from a chair using your arms (e.g., wheelchair or bedside chair)?: A Lot Help needed to walk in hospital room?: Total Help needed climbing 3-5 steps with a railing? : Total 6 Click Score: 11    End of Session Equipment Utilized During Treatment: Gait belt Activity Tolerance: Patient tolerated treatment well Patient left: in chair;with call bell/phone within reach;with chair alarm set Nurse Communication: Mobility status PT Visit Diagnosis: Difficulty in walking, not elsewhere classified (R26.2);Muscle weakness (generalized) (M62.81)    Time: [id number] PT Time Calculation (min) (ACUTE ONLY): 34 min   Charges:   PT Evaluation $PT Eval Moderate Complexity: 1 Mod          [place] Pager [phone] Office [phone]   Peter Keyworth 09/20/2019, 2:35 PM

## 2019-09-20 NOTE — Progress Notes (Signed)
Rehab Admissions Coordinator Note:  Patient was screened by Cleatrice Burke for appropriateness for an Inpatient Acute Rehab Consult per PT and OT recs.  At this time, we are recommending Inpatient Rehab consult. I will place order per protocol.  Cleatrice Burke RN MSN 09/20/2019, 2:44 PM  I can be reached at [phone].

## 2019-09-20 NOTE — Progress Notes (Signed)
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    PT Comments    Pt up in chair x 3 hrs and assisted to stand in [place] to transfer to EOB.  Pt very motivated to "do as much of this as I can".   Follow Up Recommendations  CIR     Equipment Recommendations  None recommended by PT    Recommendations for Other Services       Precautions / Restrictions Precautions Precautions: Fall Restrictions Weight Bearing Restrictions: No    Mobility  Bed Mobility Overal bed mobility: Needs Assistance Bed Mobility: Sit to Supine     Supine to sit: Mod assist;+2 for physical assistance;+2 for safety/equipment Sit to supine: Mod assist;+2 for physical assistance;+2 for safety/equipment   General bed mobility comments: Increased time with cues for sequence and assist to manage LEs onto bed and to control trunk  Transfers Overall transfer level: Needs assistance Equipment used: Rolling walker (2 wheeled) Transfers: Sit to/from Stand Sit to Stand: Mod assist;+2 physical assistance;+2 safety/equipment         General transfer comment: Assist to bring wt up and fwd standing from recliner; Stedy utilized to move chair to bedside sitting  Ambulation/Gait             General Gait Details: Pt stood only - unable to initiate step with either LE   Stairs              Wheelchair Mobility    Modified Rankin (Stroke Patients Only)       Balance Overall balance assessment: Needs assistance Sitting-balance support: No upper extremity supported;Feet supported Sitting balance-Leahy Scale: Good Sitting balance - Comments: EOB - unchallenged with dynamic movement   Standing balance support: Bilateral upper extremity supported Standing balance-Leahy Scale: Poor Standing balance comment: dependent on BUE for standing balance                            Cognition Arousal/Alertness: Awake/alert Behavior During Therapy: WFL for tasks assessed/performed Overall Cognitive Status: Within Functional Limits for tasks assessed                                        Exercises      General Comments        Pertinent Vitals/Pain Pain Assessment: Faces Pain Score: 4  Faces Pain Scale: Hurts little more Pain Location: LLE with spasms Pain Descriptors / Indicators: Discomfort;Grimacing;Spasm Pain Intervention(s): Limited activity within patient's tolerance;Monitored during session    Home Living Family/patient expects to be discharged to:: Private residence Living Arrangements: Spouse/significant other Available Help at Discharge: Family;Available 24 hours/day;Personal care attendant Type of Home: House Home Access: Ramped entrance   Home Layout: One level Home Equipment: [place] - 2 wheels;Wheelchair - [employer];Shower seat;Grab bars - tub/shower;Other (comment);Hand held shower head;Adaptive equipment Additional Comments: PCA assists with bed mobility and dressing (transfers)  Prior Function Level of Independence: Needs assistance  Gait / Transfers Assistance Needed: PCA assists with bed mobility, transfers (SPT) ADL's / Homemaking Assistance Needed: assist for dressing (including adult diaper changes), transfers for tub with shower chair, does not do IADL Comments: Pt in diaper all the time, PCA  assists with transfers and dressing (knees down) but is set up for bathing at baseline (has AE)   PT Goals (current goals can now be found in the care plan section) Acute Rehab PT Goals Patient Stated Goal: To get to CIR and maximize independence PT Goal Formulation: With patient Time For Goal Achievement: 10/04/19 Potential to Achieve Goals: Fair Progress towards PT goals: Progressing toward goals    Frequency    Min 3X/week      PT Plan Current plan remains appropriate    Co-evaluation PT/OT/SLP Co-Evaluation/Treatment: Yes Reason for Co-Treatment: For patient/therapist safety PT goals addressed during session: Mobility/safety with mobility OT goals addressed during session: ADL's and self-care      AM-PAC PT "6 Clicks" Mobility   Outcome Measure  Help needed turning from your back to your side while in a flat bed without using bedrails?: A Little Help needed moving from lying on your back to sitting on the side of a flat bed without using bedrails?: A Lot Help needed moving to and from a bed to a chair (including a wheelchair)?: A Lot Help needed standing up from a chair using your arms (e.g., wheelchair or bedside chair)?: A Lot Help needed to walk in hospital room?: Total Help needed climbing 3-5 steps with a railing? : Total 6 Click Score: 11    End of Session Equipment Utilized During Treatment: Gait belt Activity Tolerance: Patient tolerated treatment well Patient left: in bed;with call bell/phone within reach;with family/visitor present Nurse Communication: Mobility status PT Visit Diagnosis: Difficulty in walking, not elsewhere classified (R26.2);Muscle weakness (generalized) (M62.81)     Time: [id number] PT Time Calculation (min) (ACUTE ONLY): 16 min  Charges:  $Therapeutic Activity: 8-22 mins                     [place] Pager [phone] Office [phone] \   [name] 09/20/2019, 2:40 PM

## 2019-09-21 LAB — GLUCOSE, CAPILLARY
Glucose-Capillary: 124 mg/dL — ABNORMAL HIGH (ref 70–99)
Glucose-Capillary: 127 mg/dL — ABNORMAL HIGH (ref 70–99)
Glucose-Capillary: 128 mg/dL — ABNORMAL HIGH (ref 70–99)
Glucose-Capillary: 142 mg/dL — ABNORMAL HIGH (ref 70–99)
Glucose-Capillary: 157 mg/dL — ABNORMAL HIGH (ref 70–99)
Glucose-Capillary: 164 mg/dL — ABNORMAL HIGH (ref 70–99)

## 2019-09-21 LAB — COMPREHENSIVE METABOLIC PANEL
ALT: 13 U/L (ref 0–44)
AST: 17 U/L (ref 15–41)
Albumin: 3.4 g/dL — ABNORMAL LOW (ref 3.5–5.0)
Alkaline Phosphatase: 52 U/L (ref 38–126)
Anion gap: 8 (ref 5–15)
BUN: 28 mg/dL — ABNORMAL HIGH (ref 8–23)
CO2: 27 mmol/L (ref 22–32)
Calcium: 8.1 mg/dL — ABNORMAL LOW (ref 8.9–10.3)
Chloride: 106 mmol/L (ref 98–111)
Creatinine, Ser: 0.9 mg/dL (ref 0.61–1.24)
GFR calc Af Amer: >60 mL/min (ref >60)
GFR calc non Af Amer: >60 mL/min (ref >60)
Glucose, Bld: 125 mg/dL — ABNORMAL HIGH (ref 70–99)
Potassium: 4.4 mmol/L (ref 3.5–5.1)
Sodium: 141 mmol/L (ref 135–145)
Total Bilirubin: 0.8 mg/dL (ref 0.3–1.2)
Total Protein: 6.3 g/dL — ABNORMAL LOW (ref 6.5–8.1)

## 2019-09-21 LAB — CBC WITH DIFFERENTIAL/PLATELET
Abs Immature Granulocytes: 0.13 10*3/uL — ABNORMAL HIGH (ref 0.00–0.07)
Basophils Absolute: 0 10*3/uL (ref 0.0–0.1)
Basophils Relative: 0 %
Eosinophils Absolute: 0 10*3/uL (ref 0.0–0.5)
Eosinophils Relative: 0 %
HCT: 38.5 % — ABNORMAL LOW (ref 39.0–52.0)
Hemoglobin: 12.5 g/dL — ABNORMAL LOW (ref 13.0–17.0)
Immature Granulocytes: 1 %
Lymphocytes Relative: 7 %
Lymphs Abs: 1 10*3/uL (ref 0.7–4.0)
MCH: 30.1 pg (ref 26.0–34.0)
MCHC: 32.5 g/dL (ref 30.0–36.0)
MCV: 92.8 fL (ref 80.0–100.0)
Monocytes Absolute: 0.6 10*3/uL (ref 0.1–1.0)
Monocytes Relative: 4 %
Neutro Abs: 11.7 10*3/uL — ABNORMAL HIGH (ref 1.7–7.7)
Neutrophils Relative %: 88 %
Platelets: 217 10*3/uL (ref 150–400)
RBC: 4.15 MIL/uL — ABNORMAL LOW (ref 4.22–5.81)
RDW: 14 % (ref 11.5–15.5)
WBC: 13.5 10*3/uL — ABNORMAL HIGH (ref 4.0–10.5)
nRBC: 0 % (ref 0.0–0.2)

## 2019-09-21 LAB — MAGNESIUM: Magnesium: 2.4 mg/dL (ref 1.7–2.4)

## 2019-09-21 LAB — PHOSPHORUS: Phosphorus: 2.1 mg/dL — ABNORMAL LOW (ref 2.5–4.6)

## 2019-09-21 MED ORDER — SALINE SPRAY 0.65 % NA SOLN
1.0000 | NASAL | Status: DC | PRN
  Filled 2019-09-21: qty 44

## 2019-09-21 NOTE — Progress Notes (Signed)
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  Patient was admitted with generalized fatigue, severe, limiting mobility around the house.  Denied any fall or injury. Patient has chronic low back pain with radiation down the left leg that he does not think is significantly changed. Denied cough denies congestion denies fever. Denied loss of bowel or bladder denies loss peritoneal sensation. Has chronic numbness and weakness to his left leg that is unchanged. Denied recent head injury.   At baseline (Prior to onset of current problems), patient reported being able to ambulate short distances with walker.  Chronic LEFT hemiparesis.  Chronic L LE spasm, stated approximately 2 weeks prior to presentation began to have increased L LE weakness with pain.  On the day of presentation, patient woke and was unable to ambulate at all.  Increased L LE pain/decreased sensation lateral aspect of L LE.  States negative increased back pain.  Negative increased stool/urine incontinence.   Subjective: Seen  No new complaints. Patient is awaiting CIR.  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 [facility])   Essential hypertension   PVD (peripheral vascular disease) (HCC)   Chronic diastolic CHF (congestive heart failure) (HCC)   Diabetes mellitus type 2, controlled, with complications ([place])   Obese   Thoracic aortic aneurysm (HCC)   Bowel incontinence   Bladder incontinence   Acute renal failure (ARF) (HCC)   Left hemiparesis (HCC)   Chronic respiratory failure with hypoxia (HCC)   Syncope and collapse   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    Acute on chronic lower extremity weakness. -Per patient usually can ambulate short distance with walker.  Starting today unable to ambulate at all, with increased LLE pain -MRI T-spine/L-spine; shows significant chronic damage, as well as acute changes see results below.  -4/2 with initiation of high-dose steroids decreased weakness, and muscle spasms. 09/21/2019: Pursue inpatient rehab.  Acute on chronic nerve root impingement L-spine -Although patient extremely poor candidate for any surgical intervention, (acute on chronic), multiple comorbid conditions), will contact neurosurgery in the A.m. -4/2 discussed case with Dr. Kristeen Miss neurosurgery who reviewed all MRI scans agrees patient patient is extremely poor candidate for any surgical procedure.  Agrees medical management -Solu-Medrol IV 60 mg TID x7 days.  Then transition to prednisone p.o. taper. -4/2 increase baclofen 15 mg TID -4/2 OxyContin 10 mg BID -Continue Vicodin PRN  LLE hypoesthesia -Per patient 3/31 began to have lateral leg loss of sensation.  Bowel/bladder incontinence -Chronic.  Per patient stable   Chronic diastolic CHF? -Per last echocardiogram CHF  indeterminate.  However patient on multiple medications consistent with CHF. -3/31 echocardiogram; normal  echocardiogram CHF RULED OUT -Strict in and out  -Daily weight Filed Weights   09/19/19 0549 09/20/19 0500 09/21/19 0400  Weight: 92.3 kg 93.9 kg 95.5 kg  -Monitor closely for fluid overload -Hold all BP medication patient's BP borderline  Essential HTN -See CHF  THORACIC ascending aortic aneurysm (per past medical history in chart) -Patient states unaware of this condition -08/18/2017 reported as 4.2 cm -MRI of thoracic spine should show any enlargement.  Per guidelines patient should have yearly evaluation.    Chronic respiratory failure with hypoxia -Patient on 2 L O2 at home -Titrate O2 to maintain SPO2> 88% -Albuterol q 4hr as needed -Fluticasone-Umeclidin-Vilant 100-60 2.5-25 mcg/INH daily -DuoNeb QID PRN  OSA/OHS -CPAP per respiratory  Acute renal failure (baseline Cr 0.87) -Normal saline 84ml/hr -Resolved  DM type II controlled with complication -Hold home medications secondary to acute renal failure -4/1 increase resistant SSI (on steroids)   L LE DVT? -Patient with left calf swelling and pain to palpation; D-dimer negative.  No further work-up required at this point   Goals of care -4/2 PT/OT consult; Evaluate for CIR (preferred) vs SNF   DVT prophylaxis: Lovenox Code Status: Full Family Communication: 4/2 wife at bedside counseled on plan of care answered all questions Disposition Plan: TBD. 1.  Where the patient is from 2.  Anticipated d/c place. 3.  Barriers to d/c resolution of acute neurologic deficit.  And patient will need inpatient rehab most likely.   Consultants:  Neurosurgery phone consult Dr. Kristeen Miss  Procedures/Significant Events:  3/31 CT head Wo contrast;No evidence of acute infarction, hemorrhage, hydrocephalus, extra-axial collection or mass lesion/mass effect. Scattered low-density changes within the periventricular and subcortical white matter compatible with chronic microvascular ischemic change. Mild diffuse cerebral  volume loss.  3/31 MRI C-spine W/WO contrast; -Postoperative changes from prior ACDF at C3-C6. -mild to moderate diffuse spinal stenosis with moderate to severe bilateral foraminal narrowing  Overall, appearance is improved relative to preoperative exam from 2019. -Patchy cord signal abnormality at the level of C3-4, consistent with chronic myelomalacia. -Trace 2 mm retrolisthesis of C2 on C3, with 2-3 mm anterolisthesis of C6 on C7 and C7 on T1. Findings chronic and facet mediated. -Cord: Probable patchy cord signal abnormality within the cervical spinal cord at the level of C3-4, consistent with chronic myelomalacia related to previously seen compressive stenosis.  -C2-C3: Retrolisthesis. Mild diffuse disc bulge. Right>>LEFT. Moderate left with mild right C3 foraminal stenosis. -C3-C4: Prior fusion. Residual posterior endplate osseous ridging indents the ventral thecal sac with persistent mild to moderate spinal stenosis.  -Persistent moderate to severe right worse than left C4 foraminal narrowing. Appearance is improved relative to previous exam. -C4-C5: Prior fusion. Residual posterior endplate osseous ridging indents the ventral thecal sac with persistent mild spinal stenosis. -Moderate bilateral facet hypertrophy. Persistent moderate to severe bilateral foraminal narrowing. Appearance is slightly improved from previous exam. -C5-C6: Prior fusion. Residual central endplate osseous ridging indents the ventral thecal sac, contacting and mildly flattening the cervical spinal cord. Persistent mild to moderate spinal stenosis,improved from previous.  -Moderate to severe right worse than left C6 foraminal narrowing, also improved. -C6-C7: Anterolisthesis. Mild disc bulge with bilateral uncovertebral hypertrophy.  -Moderate bilateral C7 foraminal narrowing, grossly stable. -C7-T1: Anterolisthesis. Negative interspace. Bilateral facet hypertrophy. No significant spinal stenosis. Foramina remain  patent. -T1-2: Diffuse disc bulge. Posterior element hypertrophy. Resultant mild spinal stenosis with moderate left worse than right  foraminal narrowing, grossly similar. 3/31 MRI T-spine;- area of hyperintense T2-weighted signal within the lower thoracic spinal cord.Incompletely evaluated due to motion artifact and lack of IV contrast.  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 4/1 MRI L-spine;-Lumbar spondylosis, degenerative disc disease, and congenitally short pedicles causing prominent impingement at L2-3 and L3-4; -Moderate impingement at L1-2 and L4-5; and  -Mild impingement T12-L1 and L5-S1,  -Impingement at L1-2 and L2-3 is minimally worsened from the prior exam of 07/24/2017. -Questionable accentuated central T2 signal in the distal thoracic cord, as suggested on prior thoracic spine MRI 4/2 Echocardiogram;LVEF= 55 to 60%.  -Normal left ventricle diastolic parameters.    Continuous Infusions: . sodium chloride 50 mL/hr at 09/21/19 0226    Objective: Vitals:   09/20/19 1405 09/20/19 2028 09/21/19 0400 09/21/19 0753  BP: 130/73 140/73 133/73   Pulse: 74 79 79   Resp: 16 18 18    Temp: 97.9 F (36.6 C) (!) 97.4 F (36.3 C) (!) 97.3 F (36.3 C)   TempSrc:  Oral Oral   SpO2: 95% 95% 91% 92%  Weight:   95.5 kg   Height:        Intake/Output Summary (Last 24 hours) at 09/21/2019 1218 Last data filed at 09/21/2019 1100 Gross per 24 hour  Intake 480 ml  Output 200 ml  Net 280 ml   Filed Weights   09/19/19 0549 09/20/19 0500 09/21/19 0400  Weight: 92.3 kg 93.9 kg 95.5 kg   Physical Exam:  General: Not in any distress.  Patient is awake and alert. Eyes: No pallor or jaundice. Neck: No raised JVD. Lungs: Clear to auscultation. Cardiovascular:  S1 and S2 Abdomen: OBESE, soft and nontender.  Organs at difficult to assess.   Central nervous system: Awake and alert.  Patient moves all extremities.  Data  Reviewed: Care during the described time interval was provided by me .  I have reviewed this patient's available data, including medical history, events of note, physical examination, and all test results as part of my evaluation.  CBC: Recent Labs  Lab 09/17/19 1111 09/17/19 2029 09/19/19 0509 09/20/19 0533 09/21/19 0532  WBC 13.1* 9.8 8.1 11.6* 13.5*  NEUTROABS 10.3*  --  7.4 10.2* 11.7*  HGB 13.3 12.6* 13.8 12.3* 12.5*  HCT 40.5 38.2* 42.7 38.1* 38.5*  MCV 91.2 91.4 93.8 92.0 92.8  PLT 214 211 218 231 [id number]   Basic Metabolic Panel: Recent Labs  Lab 09/17/19 1111 09/17/19 1111 09/17/19 2029 09/18/19 0747 09/19/19 0509 09/20/19 0533 09/21/19 0532  NA 138  --   --  140 141 141 141  K 4.8  --   --  4.6 4.6 4.6 4.4  CL 101  --   --  103 106 106 106  CO2 27  --   --  27 25 26 27   GLUCOSE 109*  --   --  99 148* 140* 125*  BUN 43*  --   --  26* 23 31* 28*  CREATININE 1.64*   < > 1.26* 1.18 0.93 0.92 0.90  CALCIUM 9.3  --   --  8.9 9.1 8.5* 8.1*  MG  --   --   --  2.4 2.2 2.3 2.4  PHOS  --   --   --  4.2 4.1 3.0 2.1*   < > = values in this interval not displayed.   GFR: Estimated Creatinine Clearance: 74.5 mL/min (by C-G formula based on SCr of 0.9 mg/dL).  Liver Function Tests: Recent Labs  Lab 09/17/19 1111 09/18/19 0747 09/19/19 0509 09/20/19 0533 09/21/19 0532  AST 21 16 14* 15 17  ALT 10 8 10 9 13   ALKPHOS 61 62 63 55 52  BILITOT 0.6 0.8 0.7 0.4 0.8  PROT 7.0 6.9 7.1 6.4* 6.3*  ALBUMIN 3.9 3.8 3.8 3.5 3.4*   No results for input(s): LIPASE, AMYLASE in the last 168 hours. No results for input(s): AMMONIA in the last 168 hours. Coagulation Profile: No results for input(s): INR, PROTIME in the last 168 hours. Cardiac Enzymes: No results for input(s): CKTOTAL, CKMB, CKMBINDEX, TROPONINI in the last 168 hours. BNP (last 3 results) Recent Labs    02/14/19 1001  PROBNP 482   HbA1C: No results for input(s): HGBA1C in the last 72 hours. CBG: Recent Labs  Lab  09/20/19 1556 09/20/19 2026 09/20/19 2350 09/21/19 0403 09/21/19 0809  GLUCAP 124* 184* 161* 124* 127*   Lipid Profile: No results for input(s): CHOL, HDL, LDLCALC, TRIG, CHOLHDL, LDLDIRECT in the last 72 hours. Thyroid Function Tests: No results for input(s): TSH, T4TOTAL, FREET4, T3FREE, THYROIDAB in the last 72 hours. Anemia Panel: No results for input(s): VITAMINB12, FOLATE, FERRITIN, TIBC, IRON, RETICCTPCT in the last 72 hours. Sepsis Labs: No results for input(s): PROCALCITON, LATICACIDVEN in the last 168 hours.  Recent Results (from the past 240 hour(s))  Culture, Urine     Status: Abnormal   Collection Time: 09/17/19  1:05 PM   Specimen: Urine, Clean Catch  Result Value Ref Range Status   Specimen Description   Final    URINE, CLEAN CATCH Performed at [facility], [place] [address] [place], [place] [address]    Special Requests   Final    NONE Performed at [facility], [employer] [address] [place], [place] [address]    Culture (A)  Final    30,000 COLONIES/mL DIPHTHEROIDS(CORYNEBACTERIUM SPECIES) Standardized susceptibility testing for this organism is not available. Performed at [facility], [place] [address], [place], [place] [address]    Report Status 09/18/2019 FINAL  Final  SARS CORONAVIRUS 2 (TAT 6-24 HRS) Nasopharyngeal Nasopharyngeal Swab     Status: None   Collection Time: 09/17/19  5:49 PM   Specimen: Nasopharyngeal Swab  Result Value Ref Range Status   SARS Coronavirus 2 NEGATIVE NEGATIVE Final    Comment: (NOTE) SARS-CoV-2 target nucleic acids are NOT DETECTED. The SARS-CoV-2 RNA is generally detectable in upper and lower respiratory specimens during the acute phase of infection. Negative results do not preclude SARS-CoV-2 infection, do not rule out co-infections with other pathogens, and should not be used as the sole basis for treatment or other patient management decisions. Negative results must be combined  with clinical observations, patient history, and epidemiological information. The expected result is Negative. Fact Sheet for Patients: SugarRoll.be Fact Sheet for Healthcare Providers: https://www.woods-mathews.com/ This test is not yet approved or cleared by the [place] FDA and  has been authorized for detection and/or diagnosis of SARS-CoV-2 by FDA under an Emergency Use Authorization (EUA). This EUA will remain  in effect (meaning this test can be used) for the duration of the COVID-19 declaration under Section 56 4(b)(1) of the Act, 21 U.S.C. section 360bbb-3(b)(1), unless the authorization is terminated or revoked sooner. Performed at [facility], [place] [address] [place], [place] [address]   Culture, blood (routine x 2)     Status: None (Preliminary result)   Collection Time: 09/17/19  6:55 PM  Specimen: BLOOD RIGHT FOREARM  Result Value Ref Range Status   Specimen Description   Final    BLOOD RIGHT FOREARM Performed at [place] [address], [place], [place] [address]    Special Requests   Final    BOTTLES DRAWN AEROBIC ONLY Blood Culture results may not be optimal due to an inadequate volume of blood received in culture bottles Performed at [place] [address], [place], [place] [address]    Culture   Final    NO GROWTH 4 DAYS Performed at [facility], [place] [address], [place], [place] [address]    Report Status PENDING  Incomplete  Culture, blood (routine x 2)     Status: None (Preliminary result)   Collection Time: 09/17/19  6:55 PM   Specimen: BLOOD LEFT FOREARM  Result Value Ref Range Status   Specimen Description   Final    BLOOD LEFT FOREARM Performed at [place] [address], [place], [place] [address]    Special Requests   Final    BOTTLES DRAWN AEROBIC AND ANAEROBIC Blood Culture adequate volume Performed at [place] [address], [place], [place] [address]    Culture   Final    NO GROWTH 4 DAYS Performed at [facility], [place] [address] [place], [place] [address]    Report Status PENDING  Incomplete         Radiology Studies: No results found.      Scheduled Meds: . allopurinol  300 mg Oral Daily  . aspirin EC  81 mg Oral Daily  . baclofen  15 mg Oral TID  . carbidopa-levodopa  1 tablet Oral BID  . colchicine  0.6 mg Oral Daily  . enoxaparin (LOVENOX) injection  40 mg Subcutaneous Daily  . fluticasone furoate-vilanterol  1 puff Inhalation Daily  . gabapentin  100 mg Oral QHS  . insulin aspart  0-20 Units Subcutaneous Q4H  . latanoprost  1 drop Both Eyes QHS  . levETIRAcetam  250 mg Oral BID  . levothyroxine  125 mcg Oral QAC breakfast  . LORazepam  2 mg Intravenous Once  . methylPREDNISolone (SOLU-MEDROL) injection  60 mg Intravenous Q8H  .  morphine injection  4 mg Intravenous Once  . multivitamin with minerals  1 tablet Oral Daily  . ondansetron (ZOFRAN) IV  4 mg Intravenous Once  . oxyCODONE  10 mg Oral Q12H  . pantoprazole  40 mg Oral QPM  . simvastatin  40 mg Oral q1800  . umeclidinium bromide  1 puff Inhalation Daily   Continuous Infusions: . sodium chloride 50 mL/hr at 09/21/19 0226     LOS: 4 days   Time spent: 25 min  Bonnell Public, MD [facility]  If 7PM-7AM, please contact night-coverage [email or web address] Password [facility] 09/21/2019, 12:18 PM

## 2019-09-22 ENCOUNTER — Ambulatory Visit: Payer: Self-pay | Admitting: Neurology

## 2019-09-22 LAB — CULTURE, BLOOD (ROUTINE X 2)
Culture: NO GROWTH
Culture: NO GROWTH
Special Requests: ADEQUATE

## 2019-09-22 LAB — COMPREHENSIVE METABOLIC PANEL
ALT: 19 U/L (ref 0–44)
AST: 25 U/L (ref 15–41)
Albumin: 3.2 g/dL — ABNORMAL LOW (ref 3.5–5.0)
Alkaline Phosphatase: 49 U/L (ref 38–126)
Anion gap: 8 (ref 5–15)
BUN: 28 mg/dL — ABNORMAL HIGH (ref 8–23)
CO2: 26 mmol/L (ref 22–32)
Calcium: 7.5 mg/dL — ABNORMAL LOW (ref 8.9–10.3)
Chloride: 106 mmol/L (ref 98–111)
Creatinine, Ser: 0.8 mg/dL (ref 0.61–1.24)
GFR calc Af Amer: >60 mL/min (ref >60)
GFR calc non Af Amer: >60 mL/min (ref >60)
Glucose, Bld: 124 mg/dL — ABNORMAL HIGH (ref 70–99)
Potassium: 4.4 mmol/L (ref 3.5–5.1)
Sodium: 140 mmol/L (ref 135–145)
Total Bilirubin: 0.5 mg/dL (ref 0.3–1.2)
Total Protein: 6 g/dL — ABNORMAL LOW (ref 6.5–8.1)

## 2019-09-22 LAB — CBC WITH DIFFERENTIAL/PLATELET
Abs Immature Granulocytes: 0.24 10*3/uL — ABNORMAL HIGH (ref 0.00–0.07)
Basophils Absolute: 0 10*3/uL (ref 0.0–0.1)
Basophils Relative: 0 %
Eosinophils Absolute: 0 10*3/uL (ref 0.0–0.5)
Eosinophils Relative: 0 %
HCT: 38.1 % — ABNORMAL LOW (ref 39.0–52.0)
Hemoglobin: 12.2 g/dL — ABNORMAL LOW (ref 13.0–17.0)
Immature Granulocytes: 2 %
Lymphocytes Relative: 9 %
Lymphs Abs: 0.9 10*3/uL (ref 0.7–4.0)
MCH: 29.7 pg (ref 26.0–34.0)
MCHC: 32 g/dL (ref 30.0–36.0)
MCV: 92.7 fL (ref 80.0–100.0)
Monocytes Absolute: 0.6 10*3/uL (ref 0.1–1.0)
Monocytes Relative: 6 %
Neutro Abs: 8.5 10*3/uL — ABNORMAL HIGH (ref 1.7–7.7)
Neutrophils Relative %: 83 %
Platelets: 209 10*3/uL (ref 150–400)
RBC: 4.11 MIL/uL — ABNORMAL LOW (ref 4.22–5.81)
RDW: 14.1 % (ref 11.5–15.5)
WBC: 10.3 10*3/uL (ref 4.0–10.5)
nRBC: 0 % (ref 0.0–0.2)

## 2019-09-22 LAB — GLUCOSE, CAPILLARY
Glucose-Capillary: 124 mg/dL — ABNORMAL HIGH (ref 70–99)
Glucose-Capillary: 111 mg/dL — ABNORMAL HIGH (ref 70–99)
Glucose-Capillary: 162 mg/dL — ABNORMAL HIGH (ref 70–99)
Glucose-Capillary: 150 mg/dL — ABNORMAL HIGH (ref 70–99)
Glucose-Capillary: 146 mg/dL — ABNORMAL HIGH (ref 70–99)

## 2019-09-22 LAB — PHOSPHORUS: Phosphorus: 2.4 mg/dL — ABNORMAL LOW (ref 2.5–4.6)

## 2019-09-22 LAB — MAGNESIUM: Magnesium: 2.6 mg/dL — ABNORMAL HIGH (ref 1.7–2.4)

## 2019-09-22 NOTE — Progress Notes (Signed)
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    PT Comments    Pt supine in bed, agreeable to therapy and continues to be motivated with getting stronger. Pt able to bring BLE to EOB, but requires mod assist to upright trunk off of elevated HOB and to scoot forward to place feet flat on floor. Pt performs STS transfers from EOB with mod assist x2, unable to fully extend hip, knees and trunk due to weakness. Pt performs lateral scoot transfer to drop arm bedside chair with mod assist and educated on head-hip relationship to shift weight into feet to assist in scooting buttocks over to chair. Session ended due to lunch arriving once transferred into chair. Verbal cues for pursed lip breathing and avoiding breath holding techniques with mobility. Patient will benefit from continued physical therapy in hospital and recommended venue below to increase strength, balance, endurance for safe ADLs and gait.   Follow Up Recommendations  CIR     Equipment Recommendations  None recommended by PT    Recommendations for Other Services       Precautions / Restrictions Precautions Precautions: Fall Restrictions Weight Bearing Restrictions: No    Mobility  Bed Mobility Overal bed mobility: Needs Assistance Bed Mobility: Supine to Sit     Supine to sit: Mod assist;HOB  elevated     General bed mobility comments: pt able to bring BLE to EOB with trunk resting on elevated HOB, mod assist to upright trunk and scoot to EOB  Transfers Overall transfer level: Needs assistance Equipment used: Rolling walker (2 wheeled) Transfers: Sit to/from Stand;Lateral/Scoot Transfers Sit to Stand: Mod assist;+2 physical assistance;+2 safety/equipment        Lateral/Scoot Transfers: Mod assist General transfer comment: mod assist x2 to power up, unable to achieve fully upright due to weakness; scoot transfer to drop arm bedside chair with mod assist to power over and verbal cues for hand placement and shifting weight into feet/off ot buttock  Ambulation/Gait             General Gait Details: not attempted   Stairs             Wheelchair Mobility    Modified Rankin (Stroke Patients Only)       Balance Overall balance assessment: Needs assistance Sitting-balance support: No upper extremity supported;Feet supported         Cognition Arousal/Alertness: Awake/alert Behavior During Therapy: WFL for tasks assessed/performed Overall Cognitive Status: Within Functional Limits for tasks assessed                       Exercises      General Comments        Pertinent Vitals/Pain Pain Assessment: Faces Faces Pain Scale: Hurts little more Pain Location: LLE Pain Descriptors / Indicators: Grimacing;Spasm Pain Intervention(s): Limited activity within patient's tolerance;Monitored during session;Repositioned    Home Living  Prior Function            PT Goals (current goals can now be found in the care plan section) Acute Rehab PT Goals Patient Stated Goal: To get to CIR and maximize independence PT Goal Formulation: With patient Time For Goal Achievement: 10/04/19 Potential to Achieve Goals: Fair Progress towards PT goals: Progressing toward goals    Frequency    Min 3X/week      PT Plan  Current plan remains appropriate    Co-evaluation              AM-PAC PT "6 Clicks" Mobility   Outcome Measure  Help needed turning from your back to your side while in a flat bed without using bedrails?: A Little Help needed moving from lying on your back to sitting on the side of a flat bed without using bedrails?: A Lot Help needed moving to and from a bed to a chair (including a wheelchair)?: A Lot Help needed standing up from a chair using your arms (e.g., wheelchair or bedside chair)?: A Lot Help needed to walk in hospital room?: Total Help needed climbing 3-5 steps with a railing? : Total 6 Click Score: 11    End of Session Equipment Utilized During Treatment: Gait belt Activity Tolerance: Patient tolerated treatment well Patient left: in chair;with call bell/phone within reach;with family/visitor present Nurse Communication: Mobility status PT Visit Diagnosis: Difficulty in walking, not elsewhere classified (R26.2);Muscle weakness (generalized) (M62.81)     Time: [id number] PT Time Calculation (min) (ACUTE ONLY): 22 min  Charges:  $Therapeutic Activity: 8-22 mins                     Tori Tarri Guilfoil PT, DPT 09/22/19, 1:31 PM [phone]

## 2019-09-22 NOTE — Telephone Encounter (Signed)
Wife has called today to inform the patient is still in the hospital. She states that whatever has been started there is helping controlling the jerky leg motions and so he has been able to finally get rest. She informed they are having to have the patient go to inpatient rehab there and they will call to reschedule a follow up visit post dc from there. She wanted to make sure that Dr Brett Fairy was aware of all this and when she had time to review the notes from his admission. Informed I would pass this along.

## 2019-09-22 NOTE — Progress Notes (Signed)
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  Patient was admitted with generalized fatigue, severe, limiting mobility around the house.  Denied any fall or injury. Patient has chronic low back pain with radiation down the left leg that he does not think is significantly changed. Denied cough denies congestion denies fever. Denied loss of bowel or bladder denies loss peritoneal sensation. Has chronic numbness and weakness to his left leg that is unchanged. Denied recent head injury. At baseline (Prior to onset of current problems), patient reported being able to ambulate short distances with walker.  Chronic LEFT hemiparesis.  Chronic L LE spasm, stated approximately 2 weeks prior to presentation began to have increased L LE weakness with pain.  On the day of presentation, patient woke and was unable to ambulate at all.  Increased L LE pain/decreased sensation lateral aspect of L LE.  States negative increased back pain.  Negative increased stool/urine incontinence.   Subjective: No acute issues or events overnight, patient sitting up eating breakfast this morning, feels quite well, pain markedly improved since admission, looking forward to working with physical therapy and ultimate disposition to CIR.  Declines chest pain, shortness of breath, nausea, vomiting, diarrhea, constipation, headache, fevers,  chills.   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 -Per patient usually can ambulate short distance with walker.  At presentation - unable to ambulate at all, with increased LLE pain -MRI T-spine/L-spine; shows significant chronic damage, as well as acute changes see results below.  -4/2 with initiation of high-dose steroids decreased weakness, and muscle spasms. 4/4-5/21: Continue to pursue inpatient rehab.  Acute on chronic nerve root impingement L-spine -Although patient extremely poor candidate for any surgical intervention, (acute on chronic), multiple comorbid conditions. -4/2 discussed case with Dr. Kristeen Miss neurosurgery who reviewed all MRI scans agrees patient patient is extremely poor candidate for any surgical procedure.  Agrees with ongoing medical management only -Solu-Medrol IV 60 mg TID through 09/24/19.  Then transition to prolonged prednisone p.o. taper. 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   Bowel/bladder incontinence -Chronic.  At baseline per patient   Chronic diastolic CHF ruled out -Per last echocardiogram CHF indeterminate.  However patient on multiple medications consistent with CHF. -3/31 echocardiogram; normal echocardiogram Filed Weights   09/20/19 0500 09/21/19 0400 09/22/19 0508  Weight: 93.9 kg 95.5 kg 95 kg  -Monitor closely for fluid overload  Essential HTN -resume home HTN meds as indicated (lasix, spironolactone, metoprolol)  THORACIC ascending aortic aneurysm (per past medical history in chart) -Patient states unaware of this condition -08/18/2017 reported as 4.2 cm -MRI of thoracic spine - unable to assess aorta given motion degradation.  Per guidelines patient should have yearly evaluation.    Chronic respiratory failure with hypoxia -Patient on 2 L O2 at home -Titrate O2 to maintain SPO2> 88% -Albuterol q 4hr as needed -Fluticasone-Umeclidin-Vilant 100-60 2.5-25 mcg/INH daily -DuoNeb QID PRN  OSA/OHS -CPAP per respiratory  Acute renal failure (baseline Cr 0.87) -Normal saline 57ml/hr -Resolved  DM type II controlled with complication -Hold home medications secondary to acute renal failure -4/1 increase resistant SSI (on steroids)   LLE swelling - DVT ruled out -Patient with left calf swelling and pain to palpation; D-dimer negative.  No further work-up indicated at this point   Goals of care -4/2 PT/OT consult; Evaluate for CIR; SNF would be second choice   DVT prophylaxis: Lovenox Code Status: Full Family Communication: 4/2 wife at bedside counseled on plan of care answered all questions Disposition Plan: Likely disposition pending safe location, CIR currently pending, SNF would be reasonable alternative if CIR declines.  Consultants:  Neurosurgery phone consult Dr. Kristeen Miss  Procedures/Significant Events:  3/31 CT head Wo contrast;No evidence of acute infarction, hemorrhage, hydrocephalus, extra-axial  collection or mass lesion/mass effect. Scattered low-density changes within the periventricular and subcortical white matter compatible with chronic microvascular ischemic change. Mild diffuse cerebral volume loss.  3/31 MRI C-spine W/WO contrast; -Postoperative changes from prior ACDF at C3-C6. -mild to moderate diffuse spinal stenosis with moderate to severe bilateral foraminal narrowing  Overall, appearance is improved relative to preoperative exam from 2019. -Patchy cord signal abnormality at the level of C3-4, consistent with chronic myelomalacia. -Trace 2 mm retrolisthesis of C2 on C3, with 2-3 mm anterolisthesis of C6 on C7 and C7 on T1. Findings chronic and facet mediated. -Cord: Probable patchy cord signal abnormality within the cervical spinal cord at the level of C3-4, consistent with chronic myelomalacia related to previously seen compressive stenosis.  -C2-C3: Retrolisthesis. Mild diffuse disc bulge. Right>>LEFT. Moderate left with mild right C3 foraminal stenosis. -C3-C4: Prior fusion. Residual posterior endplate osseous ridging indents the ventral thecal sac with persistent mild to moderate spinal stenosis.  -Persistent moderate to severe right worse than left C4 foraminal narrowing. Appearance is improved relative to previous exam. -C4-C5: Prior fusion. Residual posterior endplate osseous ridging indents the ventral thecal sac with persistent mild spinal stenosis. -Moderate bilateral facet hypertrophy. Persistent moderate to severe bilateral foraminal narrowing. Appearance is slightly improved from previous exam. -C5-C6: Prior fusion. Residual central endplate osseous ridging indents the ventral thecal sac, contacting and mildly flattening the cervical spinal cord. Persistent mild to moderate spinal stenosis,improved from previous.  -Moderate to severe right worse than left C6 foraminal narrowing, also improved. -C6-C7: Anterolisthesis. Mild disc bulge with bilateral  uncovertebral hypertrophy.  -Moderate bilateral C7 foraminal narrowing, grossly stable. -C7-T1: Anterolisthesis. Negative interspace. Bilateral facet hypertrophy. No significant spinal stenosis. Foramina remain patent. -T1-2: Diffuse disc bulge. Posterior element hypertrophy. Resultant  mild spinal stenosis with moderate left worse than right foraminal narrowing, grossly similar. 3/31 MRI T-spine;- area of hyperintense T2-weighted signal within the lower thoracic spinal cord.Incompletely evaluated due to motion artifact and lack of IV contrast.  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 4/1 MRI L-spine;-Lumbar spondylosis, degenerative disc disease, and congenitally short pedicles causing prominent impingement at L2-3 and L3-4; -Moderate impingement at L1-2 and L4-5; and  -Mild impingement T12-L1 and L5-S1,  -Impingement at L1-2 and L2-3 is minimally worsened from the prior exam of 07/24/2017. -Questionable accentuated central T2 signal in the distal thoracic cord, as suggested on prior thoracic spine MRI 4/2 Echocardiogram;LVEF= 55 to 60%.  -Normal left ventricle diastolic parameters.    Continuous Infusions: . sodium chloride 50 mL/hr at 09/21/19 2030    Objective: Vitals:   09/21/19 0753 09/21/19 1412 09/21/19 2055 09/22/19 0508  BP:  138/77 (!) 150/69 (!) 148/75  Pulse:  76 72 73  Resp:  16 19 16   Temp:  98.6 F (37 C) 98.1 F (36.7 C) 98.5 F (36.9 C)  TempSrc:   Oral Axillary  SpO2: 92% 95% 90% 93%  Weight:    95 kg  Height:        Intake/Output Summary (Last 24 hours) at 09/22/2019 0707 Last data filed at 09/21/2019 1100 Gross per 24 hour  Intake 240 ml  Output --  Net 240 ml   Filed Weights   09/20/19 0500 09/21/19 0400 09/22/19 0508  Weight: 93.9 kg 95.5 kg 95 kg   Physical Exam:  General:  Pleasantly resting in bed, No acute distress. HEENT:  Normocephalic atraumatic.  Sclerae nonicteric,  noninjected.  Extraocular movements intact bilaterally. Neck:  Without mass or deformity.  Trachea is midline. Lungs:  Clear to auscultate bilaterally without rhonchi, wheeze, or rales. Heart:  Regular rate and rhythm.  Without murmurs, rubs, or gallops. Abdomen:  Soft, nontender, nondistended.  Without guarding or rebound. Extremities: Without cyanosis, clubbing, edema, or obvious deformity. Vascular:  Dorsalis pedis and posterior tibial pulses palpable bilaterally.   CBC: Recent Labs  Lab 09/17/19 1111 09/17/19 1111 09/17/19 2029 09/19/19 0509 09/20/19 0533 09/21/19 0532 09/22/19 0532  WBC 13.1*   < > 9.8 8.1 11.6* 13.5* 10.3  NEUTROABS 10.3*  --   --  7.4 10.2* 11.7* 8.5*  HGB 13.3   < > 12.6* 13.8 12.3* 12.5* 12.2*  HCT 40.5   < > 38.2* 42.7 38.1* 38.5* 38.1*  MCV 91.2   < > 91.4 93.8 92.0 92.8 92.7  PLT 214   < > 211 218 231 217 209   < > = values in this interval not displayed.   Basic Metabolic Panel: Recent Labs  Lab 09/18/19 0747 09/19/19 0509 09/20/19 0533 09/21/19 0532 09/22/19 0532  NA 140 141 141 141 140  K 4.6 4.6 4.6 4.4 4.4  CL 103 106 106 106 106  CO2 27 25 26 27 26   GLUCOSE 99 148* 140* 125* 124*  BUN 26* 23 31* 28* 28*  CREATININE 1.18 0.93 0.92 0.90 0.80  CALCIUM 8.9 9.1 8.5* 8.1* 7.5*  MG 2.4 2.2 2.3 2.4 2.6*  PHOS 4.2 4.1 3.0 2.1* 2.4*   GFR: Estimated Creatinine Clearance: 83.6 mL/min (by C-G formula based on SCr of 0.8 mg/dL). Liver Function Tests: Recent Labs  Lab 09/18/19 0747 09/19/19 0509 09/20/19 0533 09/21/19 0532 09/22/19 0532  AST 16 14* 15 17 25   ALT 8 10 9 13 19   ALKPHOS 62  63 55 52 49  BILITOT 0.8 0.7 0.4 0.8 0.5  PROT 6.9 7.1 6.4* 6.3* 6.0*  ALBUMIN 3.8 3.8 3.5 3.4* 3.2*   No results for input(s): LIPASE, AMYLASE in the last 168 hours. No results for input(s): AMMONIA in the last 168 hours. Coagulation Profile: No results for input(s): INR, PROTIME in the last 168 hours. Cardiac Enzymes: No results for input(s):  CKTOTAL, CKMB, CKMBINDEX, TROPONINI in the last 168 hours. BNP (last 3 results) Recent Labs    02/14/19 1001  PROBNP 482   HbA1C: No results for input(s): HGBA1C in the last 72 hours. CBG: Recent Labs  Lab 09/21/19 1205 09/21/19 1552 09/21/19 1942 09/21/19 2333 09/22/19 0400  GLUCAP 142* 164* 128* 157* 124*   Lipid Profile: No results for input(s): CHOL, HDL, LDLCALC, TRIG, CHOLHDL, LDLDIRECT in the last 72 hours. Thyroid Function Tests: No results for input(s): TSH, T4TOTAL, FREET4, T3FREE, THYROIDAB in the last 72 hours. Anemia Panel: No results for input(s): VITAMINB12, FOLATE, FERRITIN, TIBC, IRON, RETICCTPCT in the last 72 hours. Sepsis Labs: No results for input(s): PROCALCITON, LATICACIDVEN in the last 168 hours.  Recent Results (from the past 240 hour(s))  Culture, Urine     Status: Abnormal   Collection Time: 09/17/19  1:05 PM   Specimen: Urine, Clean Catch  Result Value Ref Range Status   Specimen Description   Final    URINE, CLEAN CATCH Performed at [facility], [place] [address] [place], [place] [address]    Special Requests   Final    NONE Performed at [facility], [place] [address], [place], [place] [address]    Culture (A)  Final    30,000 COLONIES/mL DIPHTHEROIDS(CORYNEBACTERIUM SPECIES) Standardized susceptibility testing for this organism is not available. Performed at [facility], [place] [address], [place], [place] [address]    Report Status 09/18/2019 FINAL  Final  SARS CORONAVIRUS 2 (TAT 6-24 HRS) Nasopharyngeal Nasopharyngeal Swab     Status: None   Collection Time: 09/17/19  5:49 PM   Specimen: Nasopharyngeal Swab  Result Value Ref Range Status   SARS Coronavirus 2 NEGATIVE NEGATIVE Final    Comment: (NOTE) SARS-CoV-2 target nucleic acids are NOT DETECTED. The SARS-CoV-2 RNA is generally detectable in upper and lower respiratory specimens during the acute phase of infection. Negative results do not  preclude SARS-CoV-2 infection, do not rule out co-infections with other pathogens, and should not be used as the sole basis for treatment or other patient management decisions. Negative results must be combined with clinical observations, patient history, and epidemiological information. The expected result is Negative. Fact Sheet for Patients: SugarRoll.be Fact Sheet for Healthcare Providers: https://www.woods-mathews.com/ This test is not yet approved or cleared by the [place] FDA and  has been authorized for detection and/or diagnosis of SARS-CoV-2 by FDA under an Emergency Use Authorization (EUA). This EUA will remain  in effect (meaning this test can be used) for the duration of the COVID-19 declaration under Section 56 4(b)(1) of the Act, 21 U.S.C. section 360bbb-3(b)(1), unless the authorization is terminated or revoked sooner. Performed at [facility], [place] [address], [place], [place] [address]   Culture, blood (routine x 2)     Status: None (Preliminary result)   Collection Time: 09/17/19  6:55 PM   Specimen: BLOOD RIGHT FOREARM  Result Value Ref Range Status   Specimen Description   Final    BLOOD RIGHT FOREARM Performed at [place] [address] [place], [place] [address]    Special Requests   Final    BOTTLES DRAWN AEROBIC ONLY Blood Culture results may not be optimal due to an inadequate volume of blood received in culture bottles Performed at [place] [address], [place], [place] [address]    Culture   Final    NO GROWTH 4 DAYS Performed at [facility], [place] [address], [place], [place] [address]    Report Status PENDING  Incomplete  Culture, blood (routine x 2)     Status: None (Preliminary result)   Collection Time: 09/17/19  6:55 PM   Specimen: BLOOD LEFT FOREARM  Result Value Ref Range Status   Specimen Description   Final    BLOOD LEFT  FOREARM Performed at [place] [address], [place], [place] [address]    Special Requests   Final    BOTTLES DRAWN AEROBIC AND ANAEROBIC Blood Culture adequate volume Performed at [place] [address] [place], [place] [address]    Culture   Final    NO GROWTH 4 DAYS Performed at [facility], [place] [address] [place], [place] [address]    Report Status PENDING  Incomplete    Radiology Studies: No results found.  Scheduled Meds: . allopurinol  300 mg Oral Daily  . aspirin EC  81 mg Oral Daily  . baclofen  15 mg Oral TID  . carbidopa-levodopa  1 tablet Oral BID  . colchicine  0.6 mg Oral Daily  . enoxaparin (LOVENOX) injection  40 mg Subcutaneous Daily  . fluticasone furoate-vilanterol  1 puff Inhalation Daily  . gabapentin  100 mg Oral QHS  . insulin aspart  0-20 Units Subcutaneous Q4H  . latanoprost  1 drop Both Eyes QHS  . levETIRAcetam  250 mg Oral BID  . levothyroxine  125 mcg Oral QAC breakfast  . LORazepam  2 mg Intravenous Once  . methylPREDNISolone (SOLU-MEDROL) injection  60 mg Intravenous Q8H  .  morphine injection  4 mg Intravenous Once  . multivitamin with minerals  1 tablet Oral Daily  . ondansetron (ZOFRAN) IV  4 mg Intravenous Once  . oxyCODONE  10 mg Oral Q12H  . pantoprazole  40 mg Oral QPM  . simvastatin  40 mg Oral q1800  . umeclidinium bromide  1 puff Inhalation Daily   Continuous Infusions: . sodium chloride 50 mL/hr at 09/21/19 2030     LOS: 5 days   Time spent: 25 min  Little Ishikawa, DO [facility]  If 7PM-7AM, please contact night-coverage [email or web address]  09/22/2019, 7:07 AM

## 2019-09-22 NOTE — Progress Notes (Signed)
Inpatient Rehabilitation-Admissions Coordinator   Spoke with pt and his wife via phone. Feel pt is an appropriate candidate for CIR at this time given his motivation, caregiver support at DC, and diagnosis. We discussed CIR program details, expectations, anticipated LOS, and anticipated functional outcomes. Pt and wife would like to proceed if insurance approves. AC will begin insurance authorization process for possible admit.   Raechel Ache, OTR/L  Rehab Admissions Coordinator  [phone] 09/22/2019 2:08 PM

## 2019-09-22 NOTE — Care Management Important Message (Signed)
Important Message  Patient Details IM Letter given to Lennart Pall SW Case Manager to present to the Patient Name: KRAIG BONDI MRN: [id number] Date of Birth: [birth year]   Medicare Important Message Given:  Yes     Kerin Salen 09/22/2019, 10:36 AM

## 2019-09-22 NOTE — Telephone Encounter (Signed)
Cervical myelopathy - known to [place]. Dr Ellene Route found him a poor candidate for surgery. Gets narcotics and iv steroids. I don't think this can be maintained here. Hopefully, his rehabilitation will allow weaning of steroids and narcotics.

## 2019-09-23 LAB — CBC WITH DIFFERENTIAL/PLATELET
Abs Immature Granulocytes: 0.36 10*3/uL — ABNORMAL HIGH (ref 0.00–0.07)
Basophils Absolute: 0 10*3/uL (ref 0.0–0.1)
Basophils Relative: 0 %
Eosinophils Absolute: 0 10*3/uL (ref 0.0–0.5)
Eosinophils Relative: 0 %
HCT: 40 % (ref 39.0–52.0)
Hemoglobin: 12.9 g/dL — ABNORMAL LOW (ref 13.0–17.0)
Immature Granulocytes: 4 %
Lymphocytes Relative: 8 %
Lymphs Abs: 0.7 10*3/uL (ref 0.7–4.0)
MCH: 29.6 pg (ref 26.0–34.0)
MCHC: 32.3 g/dL (ref 30.0–36.0)
MCV: 91.7 fL (ref 80.0–100.0)
Monocytes Absolute: 0.4 10*3/uL (ref 0.1–1.0)
Monocytes Relative: 4 %
Neutro Abs: 8.1 10*3/uL — ABNORMAL HIGH (ref 1.7–7.7)
Neutrophils Relative %: 84 %
Platelets: 205 10*3/uL (ref 150–400)
RBC: 4.36 MIL/uL (ref 4.22–5.81)
RDW: 13.9 % (ref 11.5–15.5)
WBC: 9.6 10*3/uL (ref 4.0–10.5)
nRBC: 0 % (ref 0.0–0.2)

## 2019-09-23 LAB — GLUCOSE, CAPILLARY
Glucose-Capillary: 109 mg/dL — ABNORMAL HIGH (ref 70–99)
Glucose-Capillary: 124 mg/dL — ABNORMAL HIGH (ref 70–99)
Glucose-Capillary: 127 mg/dL — ABNORMAL HIGH (ref 70–99)
Glucose-Capillary: 127 mg/dL — ABNORMAL HIGH (ref 70–99)
Glucose-Capillary: 136 mg/dL — ABNORMAL HIGH (ref 70–99)
Glucose-Capillary: 174 mg/dL — ABNORMAL HIGH (ref 70–99)

## 2019-09-23 MED ORDER — POLYETHYLENE GLYCOL 3350 17 G PO PACK
17.0000 g | PACK | Freq: Two times a day (BID) | ORAL | Status: DC
  Administered 2019-09-23 – 2019-09-26 (×4): 17 g via ORAL
  Filled 2019-09-23 (×6): qty 1

## 2019-09-23 NOTE — PMR Pre-admission (Signed)
PMR Admission Coordinator Pre-Admission Assessment  Patient: [name] is an 79 y.o., male MRN: [id number] DOB: [birth year] Height: 5' 7" (170.2 cm) Weight: 99.2 kg  Insurance Information HMO:     PPO: yes     PCP:      IPA:      80/20:      OTHER:  PRIMARY: U[employer] Medicare      Policy#: [id number]      Subscriber: patient  CM Name: Estill Bamberg (customer service, and confirmed by Titus Mould provider relations manager)      Phone#: [phone]     Fax#: [phone] Pre-Cert#: [id number]      Employer:  [employer] provided by Estill Bamberg through phone [phone] for admit to CIR. Auth #: [id number]. Navi #: [id number]. Pt is approved for 7 days with initial start of care date 4/7 and next review date 4/13. Clinicals are to be faxed to (f): [phone] Benefits:  Phone #: online     Name: [email or web address] Eff. Date: 06/20/19-06/18/20     Deduct: does not have ($0)      Out of Pocket Max: $3,900 ($361.20 met)      Life Max: NA CIR: $345/day co-pay for days 1-5, $0/day co-pay for days 6+      SNF: $0/day copay for days 1-20, $184/day copay for days 21-42, $0/day copay for days 43-100; limited to 100 days/cal yr.  Outpatient: $35/visit co-pay; limited by medical necessity    Home Health: 100% coverage, 0% co-insurance; limited by medical necessity      DME: 80% coverage, 20% co-insurance  Providers:  SECONDARY: None      Policy#:       Subscriber:  CM Name:       Phone#:      Fax#:  Pre-Cert#:       Employer:  Benefits:  Phone #:      Name Eff. Date:     Deduct:       Out of Pocket Max:       Life Max:  CIR:      SNF:  Outpatient:      Co-Pay:  Home Health:       Co-Pay: DME:      Co-Pay:   Medicaid Application Date:       Case Manager:  Disability Application Date:       Case Worker:   The "Data Collection Information Summary" for patients in Inpatient Rehabilitation Facilities with attached "Privacy Act [place] Records" was provided and verbally reviewed with: Patient  Emergency  Contact Information Contact Information    Name Relation Home Work Mobile   Lucatero,Susan Spouse [phone]  [phone]   Alvino Blood Daughter [phone]     Genevieve, Ritzel [phone]        Current Medical History  Patient Admitting Diagnosis: Debility and decreased ability to complete ADLs and ambulation due to acute on chronic weakness, nerve root impingement in L spine and cervical radiculopathy  History of Present Illness: Pt is a 79 yo male with history of chronic diastolic CHF, PVD, essential HTN, thoracic ascending aortic aneurysm, OSA on CPAP/central sleep apnea, chronic respiratory failure on 2L O2 at home, DM type II with complication, 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 Pt was admitted to [facility] on 09/17/19 with complains of generalized fatigue that had been present for a few days and that had affected his ability to ambulate.  At baseline pt was able to ambulate short distances with a walker but approximately 2 prior to admission began having increased LLE weakness and pain. Pt progressed to the point of not being able to ambulate at all. MRI of his thoracic and lumbar spine showed significant chronic damage and acute changes. On 4/2 pt was started on high-dose steriods with pt noting improvement in strength and muscle spasms. Dr. Ellene Route with neurosurgery reviewed pt's case and he was felt to be an extremely poor candidate for surgery and medical management was encouraged. Pt completed IV solu-medrol course and transitioned to prolonged prednisone taper. Pt has been evaluated by therapies with recommendation for CIR as pt has had a large decline from his baseline. Pt is to admit to CIR on 09/26/19.     Patient's medical record from [facility] has been reviewed by the rehabilitation admission coordinator and  physician.  Past Medical History  Past Medical History:  Diagnosis Date  . Arthritis   . Barrett esophagus   . Bronchitis   . Carotid artery occlusion   . COPD (chronic obstructive pulmonary disease) ([place])   . Diabetes mellitus without complication ([place])   . GERD (gastroesophageal reflux disease)   . Headache    migraines  . History of thyroid cancer 2008  . Hypertension   . Restless leg   . Sleep apnea with use of continuous positive airway pressure (CPAP)    2011 [facility] , A[facility]  77cn central and obstrcutive. 16 cm water , 3 cm EPR.   . Thoracic ascending aortic aneurysm (HCC)    4.2 cm ascending TAA 08/18/17 CTA. Annual imaging recommended.  . thyroid ca dx'd 2009 or 2010   surg and radioactive isoptope  . Ulcer    Peptic ulcer disease    Family History   family history includes COPD in his mother; Cancer in his father; Diabetes in his father; Hyperlipidemia in his father and mother; Hypertension in his father and mother; Kidney disease in his father.  Prior Rehab/Hospitalizations Has the patient had prior rehab or hospitalizations prior to admission? Yes  Has the patient had major surgery during 100 days prior to admission? No   Current Medications  Current Facility-Administered Medications:  .  0.9 %  sodium chloride infusion, , Intravenous, Continuous, Allie Bossier, MD, Last Rate: 50 mL/hr at 09/23/19 1310, New Bag at 09/23/19 1310 .  albuterol (PROVENTIL) (2.5 MG/3ML) 0.083% nebulizer solution 2.5 mg, 2.5 mg, Inhalation, Q4H PRN, Allie Bossier, MD .  allopurinol (ZYLOPRIM) tablet 300 mg, 300 mg, Oral, Daily, Allie Bossier, MD, 300 mg at 09/23/19 0854 .  aspirin EC tablet 81 mg, 81 mg, Oral, Daily, Allie Bossier, MD, 81 mg at 09/23/19 0854 .  baclofen (LIORESAL) tablet 15 mg, 15 mg, Oral, TID, Allie Bossier, MD, 15 mg at 09/23/19 1709 .  carbidopa-levodopa (SINEMET IR) 10-100 MG per tablet immediate release 1 tablet, 1 tablet, Oral, BID, Allie Bossier,  MD, 1 tablet at 09/23/19 [phone] .  colchicine tablet 0.6 mg, 0.6 mg, Oral, Daily, Allie Bossier, MD, 0.6 mg at 09/23/19 0853 .  enoxaparin (LOVENOX) injection 40 mg, 40 mg, Subcutaneous, Daily, Allie Bossier, MD, 40 mg at 09/23/19 0854 .  fluticasone furoate-vilanterol (BREO ELLIPTA) 200-25 MCG/INH 1 puff, 1 puff, Inhalation, Daily, Lenis Noon, RPH, 1 puff at 09/23/19 1038 .  gabapentin (NEURONTIN) capsule 100 mg, 100 mg, Oral, QHS, Allie Bossier, MD, 100 mg at 09/22/19 2109 .  HYDROcodone-acetaminophen (NORCO/VICODIN) 5-325 MG per tablet 1 tablet, 1 tablet, Oral, TID PRN, Allie Bossier, MD, 1 tablet at 09/22/19 1735 .  insulin aspart (novoLOG) injection 0-20 Units, 0-20 Units, Subcutaneous, Q4H, Allie Bossier, MD, 3 Units at 09/23/19 1709 .  ipratropium-albuterol (DUONEB) 0.5-2.5 (3) MG/3ML nebulizer solution 3 mL, 3 mL, Nebulization, Q6H PRN, Allie Bossier, MD .  latanoprost (XALATAN) 0.005 % ophthalmic solution 1 drop, 1 drop, Both Eyes, QHS, Allie Bossier, MD, 1 drop at 09/22/19 2113 .  levETIRAcetam (KEPPRA) tablet 250 mg, 250 mg, Oral, BID, Allie Bossier, MD, 250 mg at 09/23/19 0853 .  levothyroxine (SYNTHROID) tablet 125 mcg, 125 mcg, Oral, QAC breakfast, Allie Bossier, MD, 125 mcg at 09/23/19 [phone] .  LORazepam (ATIVAN) injection 2 mg, 2 mg, Intravenous, Once, Lang Snow, FNP .  methylPREDNISolone sodium succinate (SOLU-MEDROL) 125 mg/2 mL injection 60 mg, 60 mg, Intravenous, Q8H, Little Ishikawa, MD, 60 mg at 09/23/19 1209 .  morphine 2 MG/ML injection 2 mg, 2 mg, Intravenous, Q4H PRN, Allie Bossier, MD, 2 mg at 09/21/19 1237 .  morphine 4 MG/ML injection 4 mg, 4 mg, Intravenous, Once, Deno Etienne, DO, Stopped at 09/17/19 1751 .  multivitamin with minerals tablet 1 tablet, 1 tablet, Oral, Daily, Allie Bossier, MD, 1 tablet at 09/23/19 0854 .  nitroGLYCERIN (NITROSTAT) SL tablet 0.4 mg, 0.4 mg, Sublingual, Q5 min PRN, Allie Bossier, MD .  ondansetron [facility])  injection 4 mg, 4 mg, Intravenous, Once, Deno Etienne, DO, Stopped at 09/17/19 1751 .  oxyCODONE (OXYCONTIN) 12 hr tablet 10 mg, 10 mg, Oral, Q12H, Allie Bossier, MD, 10 mg at 09/23/19 0854 .  pantoprazole (PROTONIX) EC tablet 40 mg, 40 mg, Oral, QPM, Allie Bossier, MD, 40 mg at 09/23/19 1709 .  polyethylene glycol (MIRALAX / GLYCOLAX) packet 17 g, 17 g, Oral, BID, Little Ishikawa, MD, 17 g at 09/23/19 1209 .  simvastatin (ZOCOR) tablet 40 mg, 40 mg, Oral, q1800, Allie Bossier, MD, 40 mg at 09/23/19 1709 .  sodium chloride (OCEAN) 0.65 % nasal spray 1 spray, 1 spray, Each Nare, PRN, Dana Allan I, MD .  umeclidinium bromide (INCRUSE ELLIPTA) 62.5 MCG/INH 1 puff, 1 puff, Inhalation, Daily, Lenis Noon, RPH, 1 puff at 09/23/19 1036  Patients Current Diet:  Diet Order            Diet Heart Room service appropriate? Yes; Fluid consistency: Thin  Diet effective now              Precautions / Restrictions Precautions Precautions: Fall Restrictions Weight Bearing Restrictions: No   Has the patient had 2 or more falls or a fall with injury in the past year? Yes  Prior Activity Level Household: not working or driving PTA and Supervision/Min A for ADLs and ambulation.   Prior Functional Level Self Care: Did the patient need help bathing, dressing, using the toilet or eating? Needed some help  Indoor Mobility: Did the patient need assistance with walking from room to room (with or without device)? Independent  Stairs: Did the patient need assistance with internal or external stairs (with or without device)? Dependent  Functional Cognition: Did the patient need help planning regular tasks such as shopping or remembering to take medications? Needed some help  Home Assistive Devices / [place] Devices/Equipment: CPAP, Dentures (specify type), Eyeglasses, Grab bars around toilet, Grab bars in shower, [employer], Civil engineer, contracting without back, Wheelchair, Environmental consultant  (specify type),  Other (Comment), Nebulizer(walk-in shower, front wheeled walker, upper denture) Home Equipment: Walker - 2 wheels, Wheelchair - manual, Bedside commode, Shower seat, Grab bars - tub/shower, Other (comment), Hand held shower head, Adaptive equipment  Prior Device Use: Indicate devices/aids used by the patient prior to current illness, exacerbation or injury? RW in house; wc at times   Current Functional Level Cognition  Overall Cognitive Status: Within Functional Limits for tasks assessed Orientation Level: Oriented X4    Extremity Assessment (includes Sensation/Coordination)  Upper Extremity Assessment: Generalized weakness  Lower Extremity Assessment: Generalized weakness, RLE deficits/detail, LLE deficits/detail RLE Deficits / Details: Pt reports intermittent spasms  LLE Deficits / Details: Intermittent spasms noted - pt states bandage is on knee 2* injury caused by spasm    ADLs  Overall ADL's : Needs assistance/impaired Eating/Feeding: Set up, Sitting Grooming: Set up, Wash/dry hands, Wash/dry face, Sitting Grooming Details (indicate cue type and reason): EOB Upper Body Bathing: Moderate assistance, Sitting Upper Body Bathing Details (indicate cue type and reason): for back Lower Body Bathing: Moderate assistance Lower Body Bathing Details (indicate cue type and reason): for thoroughness of peri area, back, and buttocks due to soiled with urine (condom cath disconnected); pt able to reach peri area but limited Upper Body Dressing : Minimal assistance Upper Body Dressing Details (indicate cue type and reason): to don new gown  Lower Body Dressing: Maximal assistance Toilet Transfer: Moderate assistance Toilet Transfer Details (indicate cue type and reason): lateral scoot, simulated to recliner  Toileting- Clothing Manipulation and Hygiene: Total assistance Toileting - Clothing Manipulation Details (indicate cue type and reason): due to hygiene and condom cath coming  off Functional mobility during ADLs: Moderate assistance General ADL Comments: decreased strength and activity tolerance    Mobility  Overal bed mobility: Needs Assistance Bed Mobility: Rolling, Sidelying to Sit Rolling: Min assist Sidelying to sit: Mod assist, HOB elevated Supine to sit: Mod assist, HOB elevated Sit to supine: Mod assist, +2 for physical assistance, +2 for safety/equipment General bed mobility comments: min assist to roll with cueing for technique during hygiene, transitioned to EOB with min support for LBs and mod support for trunk given increased time and effort    Transfers  Overall transfer level: Needs assistance Equipment used: Rolling walker (2 wheeled) Transfer via Lift Equipment: Stedy Transfers: Lateral/Scoot Transfers Sit to Stand: Mod assist, +2 physical assistance, +2 safety/equipment  Lateral/Scoot Transfers: Mod assist, From elevated surface General transfer comment: mod assist to transition from elevated EOB to drop arm recliner towards R side with increased cueing for technique and weight shifting     Ambulation / Gait / Stairs / Wheelchair Mobility  Ambulation/Gait General Gait Details: not attempted    Posture / Balance Dynamic Sitting Balance Sitting balance - Comments: min guard to close supervision  Balance Overall balance assessment: Needs assistance Sitting-balance support: No upper extremity supported, Feet supported Sitting balance-Leahy Scale: Good Sitting balance - Comments: min guard to close supervision  Standing balance support: Bilateral upper extremity supported Standing balance-Leahy Scale: Poor Standing balance comment: mod assist, dependent on BUE for standing balance    Special needs/care consideration BiPAP/CPAP : CPAP at home CPM : no Continuous Drip IV : no Dialysis : no        Days : no Life Vest : no Oxygen: no, on RA Special Bed : no Trach Size : no Wound Vac (area) : no      Location : no Skin: abrasion to  left knee, ecchymosis to bilateral arms, MASD  to bilateral buttocks                          Bowel mgmt: last BM 09/25/19, continent but history of incontinence (per chart) Bladder mgmt: incontinent (external urinary catheter) Diabetic mgmt: yes Behavioral consideration : no Chemo/radiation : not currently (history of thyroid cancer requiring radiation)   Previous Home Environment (from acute therapy documentation) Living Arrangements: Spouse/significant other Available Help at Discharge: Family, Available 24 hours/day, Personal care attendant Type of Home: House Home Layout: One level Home Access: Ramped entrance Bathroom Shower/Tub: Multimedia programmer: Handicapped height Bathroom Accessibility: Yes How Accessible: Accessible via wheelchair [place]: No Additional Comments: PCA assists with bed mobility and dressing (transfers)  Discharge Living Setting Plans for Discharge Living Setting: Patient's home, Lives with (comment)(wife; has caregiver ) Type of Home at Discharge: House Discharge Home Layout: One level Discharge Home Access: [place] entrance Discharge Bathroom Shower/Tub: Walk-in shower Discharge Bathroom Toilet: Standard Discharge [place] Accessibility: Yes How Accessible: Accessible via wheelchair Does the patient have any problems obtaining your medications?: No  Social/Family/Support Systems Patient Roles: Spouse Contact Information: [name] (wife): [phone] Anticipated Caregiver: wife + hired caregiver Maudie Mercury) who comes 6days/week (3hrs in AM, 3 in PM) + 3 hours sunday.  Anticipated Caregiver's Contact Information: wife [name]): [phone] Ability/Limitations of Caregiver: Min A Caregiver Availability: 24/7 Discharge Plan Discussed with Primary Caregiver: Yes Is Caregiver In Agreement with Plan?: Yes Does Caregiver/Family have Issues with Lodging/Transportation while Pt is in Rehab?: No  Goals/Additional Needs Patient/Family Goal for  Rehab: PT: Supervision/Min A; OT: Supervision/Min A; SLP: NA Expected length of stay: 14-18 days  Cultural Considerations: TBD Dietary Needs: Heart Healthy, thin liquids  Equipment Needs: TBD Pt/Family Agrees to Admission and willing to participate: Yes Program Orientation Provided & Reviewed with Pt/Caregiver Including Roles  & Responsibilities: Yes(pt and his wife)  Barriers to Discharge: Other (comments)(will need to be 1 person Min A)  Decrease burden of Care through IP rehab admission: NA  Possible need for SNF placement upon discharge: pt has a good prognosis for regaining strength and mobility for ambulation and ADLs through intensive CIR program. He has great social support at DC and an accessible home for a successful return home after IP Rehab stay.   Patient Condition: I have reviewed medical records from [facility], spoken with RN, and patient and spouse. I met with patient at the bedside for inpatient rehabilitation assessment.  Patient will benefit from ongoing PT and OT, can actively participate in 3 hours of therapy a day 5 days of the week, and can make measurable gains during the admission.  Patient will also benefit from the coordinated team approach during an Inpatient Acute Rehabilitation admission.  The patient will receive intensive therapy as well as Rehabilitation physician, nursing, social worker, and care management interventions.  Due to bladder management, bowel management, safety, skin/wound care, disease management, medication administration, pain management and patient education the patient requires 24 hour a day rehabilitation nursing.  The patient is currently Mod A +2 for sit to stand transfers and Mod A for lateral scoot transfers and Min A to total A for basic ADLs.  Discharge setting and therapy post discharge at home with home health is anticipated.  Patient has agreed to participate in the Acute Inpatient Rehabilitation Program and will admit  09/26/19.  Preadmission Screen Completed By:  Raechel Ache, 09/23/2019 5:17 PM ______________________________________________________________________   Discussed status with Dr. Naaman Plummer on 09/26/19  at 10:33AM and received approval for admission today.  Admission Coordinator:  Raechel Ache, OT, time 10:33AM/Date 09/26/19   Assessment/Plan: Diagnosis: lumbar stenosis with myelopathy/radiculopathy 1. Does the need for close, 24 hr/day Medical supervision in concert with the patient's rehab needs make it unreasonable for this patient to be served in a less intensive setting? Yes 2. Co-Morbidities requiring supervision/potential complications: cdCHF, htn, dm, cervical myelopathy 3. Due to bladder management, bowel management, safety, skin/wound care, disease management, medication administration, pain management and patient education, does the patient require 24 hr/day rehab nursing? Yes 4. Does the patient require coordinated care of a physician, rehab nurse, PT, OT, and SLP to address physical and functional deficits in the context of the above medical diagnosis(es)? Yes Addressing deficits in the following areas: balance, endurance, locomotion, strength, transferring, bowel/bladder control, bathing, dressing, feeding, grooming, toileting and psychosocial support 5. Can the patient actively participate in an intensive therapy program of at least 3 hrs of therapy 5 days a week? Yes 6. The potential for patient to make measurable gains while on inpatient rehab is excellent 7. Anticipated functional outcomes upon discharge from inpatient rehab: supervision and min assist PT, supervision and min assist OT, n/a SLP 8. Estimated rehab length of stay to reach the above functional goals is: 14-18 days 9. Anticipated discharge destination: Home 10. Overall Rehab/Functional Prognosis: excellent   MD Signature: [name], MD, [facility] 09/26/2019

## 2019-09-23 NOTE — Progress Notes (Signed)
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   OT comments  Patient supine in bed and agreeable to OT, soiled in urine from condom cath coming loose.  Patient completing rolling with min assist, LB bathing with mod assist for hygiene thoroughness, and UB dressing (donning new gown) with min assist. Transitioned to EOB with mod assist and completed lateral scoot to recliner with mod assist given mod cueing for technique and weight shifting. He will benefit from CIR level rehab, pt highly motivated!  Will follow acutely.    Follow Up Recommendations  CIR;Supervision/Assistance - 24 hour    Equipment Recommendations  None recommended by OT(has all DME )    Recommendations for Other Services Rehab consult    Precautions / Restrictions Precautions Precautions: Fall Restrictions Weight Bearing Restrictions: No       Mobility Bed Mobility Overal bed mobility: Needs Assistance Bed Mobility: Rolling;Sidelying to Sit Rolling: Min assist Sidelying to sit: Mod assist;HOB elevated       General bed mobility comments: min assist to roll with cueing for technique during hygiene, transitioned to EOB with min support for LBs and mod support for trunk given increased time and effort  Transfers Overall transfer level: Needs assistance   Transfers: Lateral/Scoot  Transfers          Lateral/Scoot Transfers: Mod assist;From elevated surface General transfer comment: mod assist to transition from elevated EOB to drop arm recliner towards R side with increased cueing for technique and weight shifting     Balance Overall balance assessment: Needs assistance Sitting-balance support: No upper extremity supported;Feet supported Sitting balance-Leahy Scale: Good Sitting balance - Comments: min guard to close supervision                                    ADL either performed or assessed with clinical judgement   ADL Overall ADL's : Needs assistance/impaired             Lower Body Bathing: Moderate assistance Lower Body Bathing Details (indicate cue type and reason): for thoroughness of peri area, back, and buttocks due to soiled with urine (condom cath disconnected); pt able to reach peri area but limited Upper Body Dressing : Minimal assistance Upper Body Dressing Details (indicate cue type and reason): to don new gown      Toilet Transfer: Moderate assistance Toilet Transfer Details (indicate cue type and reason): lateral scoot, simulated to recliner  Toileting- Clothing Manipulation and Hygiene: Total assistance Toileting - Clothing Manipulation Details (indicate cue type and reason): due to hygiene and condom cath coming off     Functional mobility during ADLs: Moderate assistance General ADL Comments: decreased strength and activity tolerance     Vision       Perception     Praxis      Cognition Arousal/Alertness: Awake/alert Behavior During Therapy: WFL for tasks assessed/performed Overall Cognitive Status: Within Functional Limits for tasks assessed  Exercises     Shoulder Instructions       General Comments tech into replace condom cath during session; pt on RA with Spo2 >91% throughout session    Pertinent Vitals/ Pain       Pain Assessment:  Faces Faces Pain Scale: Hurts little more Pain Location: B LE Pain Descriptors / Indicators: Grimacing;Spasm Pain Intervention(s): Limited activity within patient's tolerance;Monitored during session;Repositioned  Home Living                                          Prior Functioning/Environment              Frequency  Min 2X/week        Progress Toward Goals  OT Goals(current goals can now be found in the care plan section)  Progress towards OT goals: Progressing toward goals  Acute Rehab OT Goals Patient Stated Goal: To get to CIR and maximize independence OT Goal Formulation: With patient  Plan Discharge plan remains appropriate;Frequency remains appropriate    Co-evaluation                 AM-PAC OT "6 Clicks" Daily Activity     Outcome Measure   Help from another person eating meals?: A Little Help from another person taking care of personal grooming?: A Little Help from another person toileting, which includes using toliet, bedpan, or urinal?: A Lot Help from another person bathing (including washing, rinsing, drying)?: A Lot Help from another person to put on and taking off regular upper body clothing?: A Little Help from another person to put on and taking off regular lower body clothing?: A Lot 6 Click Score: 15    End of Session Equipment Utilized During Treatment: Gait belt  OT Visit Diagnosis: Unsteadiness on feet (R26.81);Other abnormalities of gait and mobility (R26.89);Muscle weakness (generalized) (M62.81);Pain Pain - Right/Left: (bil) Pain - part of body: Leg   Activity Tolerance Patient tolerated treatment well   Patient Left in chair;with call bell/phone within reach;with chair alarm set;with nursing/sitter in room   Nurse Communication Mobility status        Time: [id number] OT Time Calculation (min): 34 min  Charges: OT General Charges $OT Visit: 1 Visit OT Treatments $Self Care/Home Management : 23-37  mins  [name], [place] Pager [phone] Office [phone]     [name] 09/23/2019, 11:48 AM

## 2019-09-23 NOTE — Progress Notes (Signed)
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  Patient was admitted with generalized fatigue, severe, limiting mobility around the house.  Denied any fall or injury. Patient has chronic low back pain with radiation down the left leg that he does not think is significantly changed. Denied cough denies congestion denies fever. Denied loss of bowel or bladder denies loss peritoneal sensation. Has chronic numbness and weakness to his left leg that is unchanged. Denied recent head injury. At baseline (Prior to onset of current problems), patient reported being able to ambulate short distances with walker.  Chronic LEFT hemiparesis.  Chronic L LE spasm, stated approximately 2 weeks prior to presentation began to have increased L LE weakness with pain.  On the day of presentation, patient woke and was unable to ambulate at all.  Increased L LE pain/decreased sensation lateral aspect of L LE.  States negative increased back pain.  Negative increased stool/urine incontinence.   Subjective: No acute issues or events overnight, patient sitting up eating breakfast this morning, feels quite well, pain markedly improved since admission, looking forward to working with physical therapy and ultimate disposition to CIR.  indicates he feels somewhat constipated requesting stool softener.  Otherwise denies nausea, vomiting, diarrhea,  headache, fevers, chills.  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 -Baseline can ambulate short distance with walker.  At presentation - unable to ambulate at all, with increased LLE pain -MRI T-spine/L-spine; shows significant chronic damage, as well as acute changes see results below.  -4/2 with initiation of high-dose steroids decreased weakness, and muscle spasms.  -4/4-present: Continue to pursue inpatient rehab -tentatively excepted, awaiting insurance approval.  Acute on chronic nerve root impingement L-spine -Although patient extremely poor candidate for any surgical intervention, (acute on chronic), multiple comorbid conditions. -4/2 discussed case with Dr. Kristeen Miss neurosurgery who reviewed all MRI scans agrees patient patient is extremely poor candidate for any surgical procedure.  Agrees with ongoing medical management only -Solu-Medrol IV 60 mg TID through 09/24/19. Then transition to prolonged prednisone p.o. taper. -4/2 increase baclofen 15 mg TID -4/2 OxyContin 10 mg BID; Continue Vicodin PRN -Pain currently well controlled on current regimen  Myelopathy of C-spine  cord with cervical radiculopathy,  improving -Strength, range of motion, sensation improving drastically, essentially back to baseline per patient this morning.   Bowel/bladder incontinence -Chronic.  At baseline per patient   Chronic diastolic CHF ruled out -Per last echocardiogram CHF indeterminate.  However patient on multiple medications consistent with CHF. -3/31 echocardiogram; normal echocardiogram Filed Weights   09/21/19 0400 09/22/19 0508 09/23/19 0502  Weight: 95.5 kg 95 kg 99.2 kg  -Monitor closely for fluid overload  Essential HTN -resume home HTN meds as indicated (lasix, spironolactone, metoprolol)  THORACIC ascending aortic aneurysm (per past medical history in chart) -Patient states unaware of this condition -08/18/2017 reported as 4.2 cm -MRI of thoracic spine - unable to assess aorta given motion degradation.  Per guidelines patient should have yearly evaluation.    Chronic respiratory failure with hypoxia -Patient on 2 L O2 at home -Titrate O2 to maintain SPO2> 88% -Albuterol q 4hr as needed -Fluticasone-Umeclidin-Vilant 100-60 2.5-25 mcg/INH daily -DuoNeb QID PRN  OSA/OHS -CPAP per respiratory  Acute renal failure (baseline Cr 0.87) -Normal saline 44ml/hr -Resolved  DM type II controlled with complication -Hold home medications secondary to acute renal failure -4/1 increase resistant SSI (on steroids)   LLE swelling - DVT ruled out -Patient with left calf swelling and pain to palpation; D-dimer negative.  No further work-up indicated at this point   Goals of care -4/2 PT/OT consult; Evaluate for CIR; SNF would be second choice   DVT prophylaxis: Lovenox Code Status: Full Family Communication: 4/2 wife at bedside counseled on plan of care answered all questions Disposition Plan: Likely disposition pending CIR approval  Consultants:  Neurosurgery consult Dr. Kristeen Miss  Procedures/Significant Events:  3/31 CT head Wo contrast;No evidence of acute infarction,  hemorrhage, hydrocephalus, extra-axial collection or mass lesion/mass effect. Scattered low-density changes within the periventricular and subcortical white matter compatible with chronic microvascular ischemic change. Mild diffuse cerebral volume loss.  3/31 MRI C-spine W/WO contrast; -Postoperative changes from prior ACDF at C3-C6. -mild to moderate diffuse spinal stenosis with moderate to severe bilateral foraminal narrowing  Overall, appearance is improved relative to preoperative exam from 2019. -Patchy cord signal abnormality at the level of C3-4, consistent with chronic myelomalacia. -Trace 2 mm retrolisthesis of C2 on C3, with 2-3 mm anterolisthesis of C6 on C7 and C7 on T1. Findings chronic and facet mediated. -Cord: Probable patchy cord signal abnormality within the cervical spinal cord at the level of C3-4, consistent with chronic myelomalacia related to previously seen compressive stenosis.  -C2-C3: Retrolisthesis. Mild diffuse disc bulge. Right>>LEFT. Moderate left with mild right C3 foraminal stenosis. -C3-C4: Prior fusion. Residual posterior endplate osseous ridging indents the ventral thecal sac with persistent mild to moderate spinal stenosis.  -Persistent moderate to severe right worse than left C4 foraminal narrowing. Appearance is improved relative to previous exam. -C4-C5: Prior fusion. Residual posterior endplate osseous ridging indents the ventral thecal sac with persistent mild spinal stenosis. -Moderate bilateral facet hypertrophy. Persistent moderate to severe bilateral foraminal narrowing. Appearance is slightly improved from previous exam. -C5-C6: Prior fusion. Residual central endplate osseous ridging indents the ventral thecal sac, contacting and mildly flattening the cervical spinal cord. Persistent mild to moderate spinal stenosis,improved from previous.  -Moderate to severe right worse than left C6 foraminal narrowing, also improved. -C6-C7: Anterolisthesis.  Mild disc bulge with bilateral uncovertebral hypertrophy.  -Moderate bilateral C7 foraminal narrowing, grossly stable. -C7-T1: Anterolisthesis. Negative interspace. Bilateral facet hypertrophy. No significant spinal stenosis. Foramina remain patent. -T1-2: Diffuse disc bulge. Posterior element  hypertrophy. Resultant mild spinal stenosis with moderate left worse than right foraminal narrowing, grossly similar. 3/31 MRI T-spine;- area of hyperintense T2-weighted signal within the lower thoracic spinal cord.Incompletely evaluated due to motion artifact and lack of IV contrast.  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 4/1 MRI L-spine;-Lumbar spondylosis, degenerative disc disease, and congenitally short pedicles causing prominent impingement at L2-3 and L3-4; -Moderate impingement at L1-2 and L4-5; and  -Mild impingement T12-L1 and L5-S1,  -Impingement at L1-2 and L2-3 is minimally worsened from the prior exam of 07/24/2017. -Questionable accentuated central T2 signal in the distal thoracic cord, as suggested on prior thoracic spine MRI 4/2 Echocardiogram;LVEF= 55 to 60%.  -Normal left ventricle diastolic parameters.    Continuous Infusions: . sodium chloride 50 mL/hr at 09/21/19 2030    Objective: Vitals:   09/22/19 1406 09/22/19 2015 09/22/19 2241 09/23/19 0502  BP: (!) 146/79 (!) 161/77  (!) 155/77  Pulse: 80 70  64  Resp: 14 17 20 17   Temp: 99 F (37.2 C) 97.6 F (36.4 C)  98 F (36.7 C)  TempSrc: Oral Oral  Oral  SpO2: 94% 95%  94%  Weight:    99.2 kg  Height:        Intake/Output Summary (Last 24 hours) at 09/23/2019 0708 Last data filed at 09/23/2019 0600 Gross per 24 hour  Intake 3570.54 ml  Output 875 ml  Net 2695.54 ml   Filed Weights   09/21/19 0400 09/22/19 0508 09/23/19 0502  Weight: 95.5 kg 95 kg 99.2 kg   Physical Exam:  General:  Pleasantly resting in bed, No acute distress. HEENT:   Normocephalic atraumatic.  Sclerae nonicteric, noninjected.  Extraocular movements intact bilaterally. Neck:  Without mass or deformity.  Trachea is midline. Lungs:  Clear to auscultate bilaterally without rhonchi, wheeze, or rales. Heart:  Regular rate and rhythm.  Without murmurs, rubs, or gallops. Abdomen:  Soft, nontender, nondistended.  Without guarding or rebound. Extremities: Without cyanosis, clubbing, edema, or obvious deformity. Vascular:  Dorsalis pedis and posterior tibial pulses palpable bilaterally.   CBC: Recent Labs  Lab 09/19/19 0509 09/20/19 0533 09/21/19 0532 09/22/19 0532 09/23/19 0519  WBC 8.1 11.6* 13.5* 10.3 9.6  NEUTROABS 7.4 10.2* 11.7* 8.5* 8.1*  HGB 13.8 12.3* 12.5* 12.2* 12.9*  HCT 42.7 38.1* 38.5* 38.1* 40.0  MCV 93.8 92.0 92.8 92.7 91.7  PLT 218 231 217 209 [id number]   Basic Metabolic Panel: Recent Labs  Lab 09/18/19 0747 09/19/19 0509 09/20/19 0533 09/21/19 0532 09/22/19 0532  NA 140 141 141 141 140  K 4.6 4.6 4.6 4.4 4.4  CL 103 106 106 106 106  CO2 27 25 26 27 26   GLUCOSE 99 148* 140* 125* 124*  BUN 26* 23 31* 28* 28*  CREATININE 1.18 0.93 0.92 0.90 0.80  CALCIUM 8.9 9.1 8.5* 8.1* 7.5*  MG 2.4 2.2 2.3 2.4 2.6*  PHOS 4.2 4.1 3.0 2.1* 2.4*   GFR: Estimated Creatinine Clearance: 85.4 mL/min (by C-G formula based on SCr of 0.8 mg/dL). Liver Function Tests: Recent Labs  Lab 09/18/19 0747 09/19/19 0509 09/20/19 0533 09/21/19 0532 09/22/19 0532  AST 16 14* 15 17 25   ALT 8 10 9 13 19   ALKPHOS 62 63 55 52 49  BILITOT 0.8 0.7 0.4 0.8 0.5  PROT 6.9 7.1 6.4* 6.3* 6.0*  ALBUMIN 3.8 3.8 3.5 3.4* 3.2*   No results for input(s): LIPASE, AMYLASE in the last 168 hours. No results for input(s):  AMMONIA in the last 168 hours. Coagulation Profile: No results for input(s): INR, PROTIME in the last 168 hours. Cardiac Enzymes: No results for input(s): CKTOTAL, CKMB, CKMBINDEX, TROPONINI in the last 168 hours. BNP (last 3 results) Recent Labs     02/14/19 1001  PROBNP 482   HbA1C: No results for input(s): HGBA1C in the last 72 hours. CBG: Recent Labs  Lab 09/22/19 1127 09/22/19 1549 09/22/19 2012 09/23/19 0000 09/23/19 0456  GLUCAP 162* 150* 146* 124* 136*   Lipid Profile: No results for input(s): CHOL, HDL, LDLCALC, TRIG, CHOLHDL, LDLDIRECT in the last 72 hours. Thyroid Function Tests: No results for input(s): TSH, T4TOTAL, FREET4, T3FREE, THYROIDAB in the last 72 hours. Anemia Panel: No results for input(s): VITAMINB12, FOLATE, FERRITIN, TIBC, IRON, RETICCTPCT in the last 72 hours. Sepsis Labs: No results for input(s): PROCALCITON, LATICACIDVEN in the last 168 hours.  Recent Results (from the past 240 hour(s))  Culture, Urine     Status: Abnormal   Collection Time: 09/17/19  1:05 PM   Specimen: Urine, Clean Catch  Result Value Ref Range Status   Specimen Description   Final    URINE, CLEAN CATCH Performed at [facility], [place] [address], [place], [place] [address]    Special Requests   Final    NONE Performed at [facility], [place] [address], [place], [place] [address]    Culture (A)  Final    30,000 COLONIES/mL DIPHTHEROIDS(CORYNEBACTERIUM SPECIES) Standardized susceptibility testing for this organism is not available. Performed at [facility], [place] [address], [place], [place] [address]    Report Status 09/18/2019 FINAL  Final  SARS CORONAVIRUS 2 (TAT 6-24 HRS) Nasopharyngeal Nasopharyngeal Swab     Status: None   Collection Time: 09/17/19  5:49 PM   Specimen: Nasopharyngeal Swab  Result Value Ref Range Status   SARS Coronavirus 2 NEGATIVE NEGATIVE Final    Comment: (NOTE) SARS-CoV-2 target nucleic acids are NOT DETECTED. The SARS-CoV-2 RNA is generally detectable in upper and lower respiratory specimens during the acute phase of infection. Negative results do not preclude SARS-CoV-2 infection, do not rule out co-infections with other pathogens, and should  not be used as the sole basis for treatment or other patient management decisions. Negative results must be combined with clinical observations, patient history, and epidemiological information. The expected result is Negative. Fact Sheet for Patients: SugarRoll.be Fact Sheet for Healthcare Providers: https://www.woods-mathews.com/ This test is not yet approved or cleared by the [place] FDA and  has been authorized for detection and/or diagnosis of SARS-CoV-2 by FDA under an Emergency Use Authorization (EUA). This EUA will remain  in effect (meaning this test can be used) for the duration of the COVID-19 declaration under Section 56 4(b)(1) of the Act, 21 U.S.C. section 360bbb-3(b)(1), unless the authorization is terminated or revoked sooner. Performed at [facility], [place] [address] [place], [place] [address]   Culture, blood (routine x 2)     Status: None   Collection Time: 09/17/19  6:55 PM   Specimen: BLOOD RIGHT FOREARM  Result Value Ref Range Status   Specimen Description   Final    BLOOD RIGHT FOREARM Performed at [place] [address] [place], [place] [address]    Special Requests   Final    BOTTLES DRAWN AEROBIC ONLY Blood Culture results may not be optimal due to an inadequate volume of blood received in culture bottles Performed at [facility], [address] [place], [place] [address]    Culture   Final    NO GROWTH 5 DAYS Performed at [facility], [place] [address], [place], [place] [address]    Report Status 09/22/2019 FINAL  Final  Culture, blood (routine x 2)     Status: None   Collection Time: 09/17/19  6:55 PM   Specimen: BLOOD LEFT FOREARM  Result Value Ref Range Status   Specimen Description   Final    BLOOD LEFT FOREARM Performed at [place] [address] [place], [place] [address]    Special Requests   Final    BOTTLES  DRAWN AEROBIC AND ANAEROBIC Blood Culture adequate volume Performed at [place] [address] [place], [place] [address]    Culture   Final    NO GROWTH 5 DAYS Performed at [facility], [place] [address], [place], [place] [address]    Report Status 09/22/2019 FINAL  Final    Radiology Studies: No results found.  Scheduled Meds: . allopurinol  300 mg Oral Daily  . aspirin EC  81 mg Oral Daily  . baclofen  15 mg Oral TID  . carbidopa-levodopa  1 tablet Oral BID  . colchicine  0.6 mg Oral Daily  . enoxaparin (LOVENOX) injection  40 mg Subcutaneous Daily  . fluticasone furoate-vilanterol  1 puff Inhalation Daily  . gabapentin  100 mg Oral QHS  . insulin aspart  0-20 Units Subcutaneous Q4H  . latanoprost  1 drop Both Eyes QHS  . levETIRAcetam  250 mg Oral BID  . levothyroxine  125 mcg Oral QAC breakfast  . LORazepam  2 mg Intravenous Once  . methylPREDNISolone (SOLU-MEDROL) injection  60 mg Intravenous Q8H  .  morphine injection  4 mg Intravenous Once  . multivitamin with minerals  1 tablet Oral Daily  . ondansetron (ZOFRAN) IV  4 mg Intravenous Once  . oxyCODONE  10 mg Oral Q12H  . pantoprazole  40 mg Oral QPM  . simvastatin  40 mg Oral q1800  . umeclidinium bromide  1 puff Inhalation Daily   Continuous Infusions: . sodium chloride 50 mL/hr at 09/21/19 2030     LOS: 6 days   Time spent: 25 min  Little Ishikawa, DO [facility]  If 7PM-7AM, please contact night-coverage [email or web address]  09/23/2019, 7:08 AM

## 2019-09-24 LAB — GLUCOSE, CAPILLARY
Glucose-Capillary: 100 mg/dL — ABNORMAL HIGH (ref 70–99)
Glucose-Capillary: 102 mg/dL — ABNORMAL HIGH (ref 70–99)
Glucose-Capillary: 102 mg/dL — ABNORMAL HIGH (ref 70–99)
Glucose-Capillary: 108 mg/dL — ABNORMAL HIGH (ref 70–99)
Glucose-Capillary: 121 mg/dL — ABNORMAL HIGH (ref 70–99)
Glucose-Capillary: 143 mg/dL — ABNORMAL HIGH (ref 70–99)
Glucose-Capillary: 164 mg/dL — ABNORMAL HIGH (ref 70–99)

## 2019-09-24 LAB — CREATININE, SERUM
Creatinine, Ser: 0.71 mg/dL (ref 0.61–1.24)
GFR calc Af Amer: >60 mL/min (ref >60)
GFR calc non Af Amer: >60 mL/min (ref >60)

## 2019-09-24 MED ORDER — SUMATRIPTAN SUCCINATE 50 MG PO TABS
50.0000 mg | ORAL_TABLET | ORAL | Status: DC | PRN
  Administered 2019-09-24: 50 mg via ORAL
  Filled 2019-09-24 (×2): qty 1

## 2019-09-24 NOTE — Progress Notes (Signed)
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  Patient was admitted with generalized fatigue, severe, limiting mobility around the house.  Denied any fall or injury. Patient has chronic low back pain with radiation down the left leg that he does not think is significantly changed. Denied cough denies congestion denies fever. Denied loss of bowel or bladder denies loss peritoneal sensation. Has chronic numbness and weakness to his left leg that is unchanged. Denied recent head injury. At baseline (Prior to onset of current problems), patient reported being able to ambulate short distances with walker.  Chronic LEFT hemiparesis.  Chronic L LE spasm, stated approximately 2 weeks prior to presentation began to have increased L LE weakness with pain.  On the day of presentation, patient woke and was unable to ambulate at all.  Increased L LE pain/decreased sensation lateral aspect of L LE.  States negative increased back pain.  Negative increased stool/urine incontinence.   Subjective: Late yesterday evening patient excellently cut his hand open while trying to open a package with his wife in the room, otherwise no acute issues or events. Patient sitting up eating breakfast this morning, feels quite well, pain essentially resolved at this point, looking forward to working with physical therapy and ultimate disposition to  CIR.  indicates he feels somewhat constipated requesting stool softener.  Otherwise denies nausea, vomiting, diarrhea, headache, fevers, chills.  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 -Baseline can ambulate short distance with walker.  At presentation - unable to ambulate at all, with increased LLE pain -MRI T-spine/L-spine; shows significant chronic damage, as well as acute changes see results below.  -4/2 with initiation of high-dose steroids decreased weakness, and muscle spasms.  -4/4-present: Continue to pursue inpatient rehab -tentatively excepted, awaiting insurance approval.  Acute on chronic nerve root impingement L-spine -Although patient extremely poor candidate for any surgical intervention, (acute on chronic), multiple comorbid conditions. -4/2 discussed case with Dr. Kristeen Miss neurosurgery who reviewed all MRI scans agrees patient patient is extremely poor candidate for any surgical procedure.  Agrees with ongoing medical management only -Solu-Medrol IV 60 mg TID through 09/24/19. Then transition to prolonged prednisone p.o. taper. 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   Bowel/bladder incontinence -Chronic.  At baseline per patient   Chronic diastolic CHF ruled out -Per last echocardiogram CHF indeterminate.  However patient on multiple medications consistent with CHF. -3/31 echocardiogram; normal echocardiogram Filed Weights   09/21/19 0400 09/22/19 0508 09/23/19 0502  Weight: 95.5 kg 95 kg 99.2 kg  -Monitor closely for fluid overload  Essential HTN -resume home HTN meds as indicated (lasix, spironolactone, metoprolol)  THORACIC ascending aortic aneurysm (per past medical history in chart) -Patient states unaware of this condition -08/18/2017 reported as 4.2 cm -MRI of thoracic spine - unable to assess aorta given motion degradation.  Per guidelines patient should have yearly evaluation.    Chronic respiratory failure with hypoxia -Patient on 2 L O2 at home -Titrate O2 to maintain SPO2> 88% -Albuterol q 4hr as needed -Fluticasone-Umeclidin-Vilant 100-60 2.5-25 mcg/INH daily -DuoNeb QID PRN  OSA/OHS -CPAP per respiratory  Acute renal failure (baseline Cr 0.87) -tolerating PO - IV fluids stopped  DM type II controlled with complication -Hold home medications secondary to acute renal failure -4/1 increase resistant SSI (on steroids)  - likely able to wean off after DC given transition to PO steroids and resume home meds  LLE swelling - DVT ruled out -Patient with left calf swelling and pain to palpation; D-dimer negative.  No further work-up indicated at this point   Goals of care -4/2 PT/OT consult; Evaluate for CIR; SNF would be second choice   DVT prophylaxis: Lovenox Code Status: Full Disposition Plan: Likely disposition pending CIR approval by insurance  Consultants:   Neurosurgery consult Dr. Kristeen Miss  Procedures/Significant Events:  3/31 CT head Wo contrast;No evidence of acute infarction, hemorrhage, hydrocephalus, extra-axial collection or mass lesion/mass effect. Scattered low-density changes within the periventricular and subcortical white matter compatible with chronic microvascular ischemic change. Mild diffuse cerebral volume loss.  3/31 MRI C-spine W/WO contrast; -Postoperative changes from prior ACDF at C3-C6. -mild to moderate diffuse spinal stenosis with moderate to severe bilateral foraminal narrowing  Overall, appearance is improved relative to preoperative exam from 2019. -Patchy cord signal abnormality at the level of C3-4, consistent with chronic myelomalacia. -Trace 2 mm retrolisthesis of C2 on C3, with 2-3 mm anterolisthesis of C6 on C7 and C7 on T1. Findings chronic and facet mediated. -Cord: Probable patchy cord signal abnormality within the cervical spinal cord at the level of C3-4, consistent with chronic myelomalacia related to previously seen compressive stenosis.  -C2-C3: Retrolisthesis. Mild diffuse disc bulge. Right>>LEFT. Moderate left with mild right C3 foraminal stenosis. -C3-C4: Prior fusion. Residual posterior endplate osseous ridging indents the ventral thecal sac with persistent mild to moderate spinal stenosis.  -Persistent moderate to severe right worse than left C4 foraminal narrowing. Appearance is improved relative to previous exam. -C4-C5: Prior fusion. Residual posterior endplate osseous ridging indents the ventral thecal sac with persistent mild spinal stenosis. -Moderate bilateral facet hypertrophy. Persistent moderate to severe bilateral foraminal narrowing. Appearance is slightly improved from previous exam. -C5-C6: Prior fusion. Residual central endplate osseous ridging indents the ventral thecal sac, contacting and mildly flattening the cervical spinal cord. Persistent mild to moderate spinal  stenosis,improved from previous.  -Moderate to severe right worse than left C6 foraminal narrowing, also improved. -C6-C7: Anterolisthesis. Mild disc bulge with bilateral uncovertebral  hypertrophy.  -Moderate bilateral C7 foraminal narrowing, grossly stable. -C7-T1: Anterolisthesis. Negative interspace. Bilateral facet hypertrophy. No significant spinal stenosis. Foramina remain patent. -T1-2: Diffuse disc bulge. Posterior element hypertrophy. Resultant mild spinal stenosis with moderate left worse than right foraminal narrowing, grossly similar. 3/31 MRI T-spine;- area of hyperintense T2-weighted signal within the lower thoracic spinal cord.Incompletely evaluated due to motion artifact and lack of IV contrast.  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 4/1 MRI L-spine;-Lumbar spondylosis, degenerative disc disease, and congenitally short pedicles causing prominent impingement at L2-3 and L3-4; -Moderate impingement at L1-2 and L4-5; and  -Mild impingement T12-L1 and L5-S1,  -Impingement at L1-2 and L2-3 is minimally worsened from the prior exam of 07/24/2017. -Questionable accentuated central T2 signal in the distal thoracic cord, as suggested on prior thoracic spine MRI 4/2 Echocardiogram;LVEF= 55 to 60%.  -Normal left ventricle diastolic parameters.    Continuous Infusions: . sodium chloride 50 mL/hr at 09/23/19 1310    Objective: Vitals:   09/23/19 2217 09/24/19 0631 09/24/19 0815 09/24/19 0818  BP:  (!) 166/100    Pulse:  73    Resp: 18 18    Temp:  98.4 F (36.9 C)    TempSrc:  Oral    SpO2:  92% 93% 94%  Weight:      Height:        Intake/Output Summary (Last 24 hours) at 09/24/2019 1338 Last data filed at 09/24/2019 1013 Gross per 24 hour  Intake 480 ml  Output 1800 ml  Net -1320 ml   Filed Weights   09/21/19 0400 09/22/19 0508 09/23/19 0502  Weight: 95.5 kg 95 kg 99.2 kg   Physical  Exam:  General:  Pleasantly resting in bed, No acute distress. HEENT:  Normocephalic atraumatic.  Sclerae nonicteric, noninjected.  Extraocular movements intact bilaterally. Neck:  Without mass or deformity.  Trachea is midline. Lungs:  Clear to auscultate bilaterally without rhonchi, wheeze, or rales. Heart:  Regular rate and rhythm.  Without murmurs, rubs, or gallops. Abdomen:  Soft, nontender, nondistended.  Without guarding or rebound. Extremities: Without cyanosis, clubbing, edema, or obvious deformity. Vascular:  Dorsalis pedis and posterior tibial pulses palpable bilaterally.   CBC: Recent Labs  Lab 09/19/19 0509 09/20/19 0533 09/21/19 0532 09/22/19 0532 09/23/19 0519  WBC 8.1 11.6* 13.5* 10.3 9.6  NEUTROABS 7.4 10.2* 11.7* 8.5* 8.1*  HGB 13.8 12.3* 12.5* 12.2* 12.9*  HCT 42.7 38.1* 38.5* 38.1* 40.0  MCV 93.8 92.0 92.8 92.7 91.7  PLT 218 231 217 209 [id number]   Basic Metabolic Panel: Recent Labs  Lab 09/18/19 0747 09/18/19 0747 09/19/19 0509 09/20/19 0533 09/21/19 0532 09/22/19 0532 09/24/19 0522  NA 140  --  141 141 141 140  --   K 4.6  --  4.6 4.6 4.4 4.4  --   CL 103  --  106 106 106 106  --   CO2 27  --  25 26 27 26   --   GLUCOSE 99  --  148* 140* 125* 124*  --   BUN 26*  --  23 31* 28* 28*  --   CREATININE 1.18   < > 0.93 0.92 0.90 0.80 0.71  CALCIUM 8.9  --  9.1 8.5* 8.1* 7.5*  --   MG 2.4  --  2.2 2.3 2.4 2.6*  --   PHOS 4.2  --  4.1 3.0 2.1* 2.4*  --    < > = values in this interval not  displayed.   GFR: Estimated Creatinine Clearance: 85.4 mL/min (by C-G formula based on SCr of 0.71 mg/dL). Liver Function Tests: Recent Labs  Lab 09/18/19 0747 09/19/19 0509 09/20/19 0533 09/21/19 0532 09/22/19 0532  AST 16 14* 15 17 25   ALT 8 10 9 13 19   ALKPHOS 62 63 55 52 49  BILITOT 0.8 0.7 0.4 0.8 0.5  PROT 6.9 7.1 6.4* 6.3* 6.0*  ALBUMIN 3.8 3.8 3.5 3.4* 3.2*   No results for input(s): LIPASE, AMYLASE in the last 168 hours. No results for input(s):  AMMONIA in the last 168 hours. Coagulation Profile: No results for input(s): INR, PROTIME in the last 168 hours. Cardiac Enzymes: No results for input(s): CKTOTAL, CKMB, CKMBINDEX, TROPONINI in the last 168 hours. BNP (last 3 results) Recent Labs    02/14/19 1001  PROBNP 482   HbA1C: No results for input(s): HGBA1C in the last 72 hours. CBG: Recent Labs  Lab 09/23/19 1944 09/24/19 0020 09/24/19 0407 09/24/19 0723 09/24/19 1140  GLUCAP 174* 100* 102* 102* 108*   Lipid Profile: No results for input(s): CHOL, HDL, LDLCALC, TRIG, CHOLHDL, LDLDIRECT in the last 72 hours. Thyroid Function Tests: No results for input(s): TSH, T4TOTAL, FREET4, T3FREE, THYROIDAB in the last 72 hours. Anemia Panel: No results for input(s): VITAMINB12, FOLATE, FERRITIN, TIBC, IRON, RETICCTPCT in the last 72 hours. Sepsis Labs: No results for input(s): PROCALCITON, LATICACIDVEN in the last 168 hours.  Recent Results (from the past 240 hour(s))  Culture, Urine     Status: Abnormal   Collection Time: 09/17/19  1:05 PM   Specimen: Urine, Clean Catch  Result Value Ref Range Status   Specimen Description   Final    URINE, CLEAN CATCH Performed at [facility], [place] [address], [place], [place] [address]    Special Requests   Final    NONE Performed at [facility], [place] [address], [place], [place] [address]    Culture (A)  Final    30,000 COLONIES/mL DIPHTHEROIDS(CORYNEBACTERIUM SPECIES) Standardized susceptibility testing for this organism is not available. Performed at [facility], [place] [address] [place], [place] [address]    Report Status 09/18/2019 FINAL  Final  SARS CORONAVIRUS 2 (TAT 6-24 HRS) Nasopharyngeal Nasopharyngeal Swab     Status: None   Collection Time: 09/17/19  5:49 PM   Specimen: Nasopharyngeal Swab  Result Value Ref Range Status   SARS Coronavirus 2 NEGATIVE NEGATIVE Final    Comment: (NOTE) SARS-CoV-2 target nucleic acids are  NOT DETECTED. The SARS-CoV-2 RNA is generally detectable in upper and lower respiratory specimens during the acute phase of infection. Negative results do not preclude SARS-CoV-2 infection, do not rule out co-infections with other pathogens, and should not be used as the sole basis for treatment or other patient management decisions. Negative results must be combined with clinical observations, patient history, and epidemiological information. The expected result is Negative. Fact Sheet for Patients: SugarRoll.be Fact Sheet for Healthcare Providers: https://www.woods-mathews.com/ This test is not yet approved or cleared by the [place] FDA and  has been authorized for detection and/or diagnosis of SARS-CoV-2 by FDA under an Emergency Use Authorization (EUA). This EUA will remain  in effect (meaning this test can be used) for the duration of the COVID-19 declaration under Section 56 4(b)(1) of the Act, 21 U.S.C. section 360bbb-3(b)(1), unless the authorization is terminated or revoked sooner. Performed at [facility], [place] [address], [place], [place] [address]   Culture, blood (routine  x 2)     Status: None   Collection Time: 09/17/19  6:55 PM   Specimen: BLOOD RIGHT FOREARM  Result Value Ref Range Status   Specimen Description   Final    BLOOD RIGHT FOREARM Performed at [place] [address], [place], [place] [address]    Special Requests   Final    BOTTLES DRAWN AEROBIC ONLY Blood Culture results may not be optimal due to an inadequate volume of blood received in culture bottles Performed at [place] [address], [place], [place] [address]    Culture   Final    NO GROWTH 5 DAYS Performed at [facility], [place] [address] [place], [place] [address]    Report Status 09/22/2019 FINAL  Final  Culture, blood (routine x 2)     Status: None   Collection Time: 09/17/19  6:55  PM   Specimen: BLOOD LEFT FOREARM  Result Value Ref Range Status   Specimen Description   Final    BLOOD LEFT FOREARM Performed at [place] [address], [place], [place] [address]    Special Requests   Final    BOTTLES DRAWN AEROBIC AND ANAEROBIC Blood Culture adequate volume Performed at [place] [address], [place], [place] [address]    Culture   Final    NO GROWTH 5 DAYS Performed at [facility], [place] [address], [place], [place] [address]    Report Status 09/22/2019 FINAL  Final    Radiology Studies: No results found.  Scheduled Meds: . allopurinol  300 mg Oral Daily  . aspirin EC  81 mg Oral Daily  . baclofen  15 mg Oral TID  . carbidopa-levodopa  1 tablet Oral BID  . colchicine  0.6 mg Oral Daily  . enoxaparin (LOVENOX) injection  40 mg Subcutaneous Daily  . fluticasone furoate-vilanterol  1 puff Inhalation Daily  . gabapentin  100 mg Oral QHS  . insulin aspart  0-20 Units Subcutaneous Q4H  . latanoprost  1 drop Both Eyes QHS  . levETIRAcetam  250 mg Oral BID  . levothyroxine  125 mcg Oral QAC breakfast  . LORazepam  2 mg Intravenous Once  . methylPREDNISolone (SOLU-MEDROL) injection  60 mg Intravenous Q8H  .  morphine injection  4 mg Intravenous Once  . multivitamin with minerals  1 tablet Oral Daily  . ondansetron (ZOFRAN) IV  4 mg Intravenous Once  . oxyCODONE  10 mg Oral Q12H  . pantoprazole  40 mg Oral QPM  . polyethylene glycol  17 g Oral BID  . simvastatin  40 mg Oral q1800  . umeclidinium bromide  1 puff Inhalation Daily   Continuous Infusions: . sodium chloride 50 mL/hr at 09/23/19 1310     LOS: 7 days   Time spent: 25 min  Little Ishikawa, DO [facility]  If 7PM-7AM, please contact night-coverage [email or web address]  09/24/2019, 1:38 PM

## 2019-09-25 LAB — GLUCOSE, CAPILLARY
Glucose-Capillary: 100 mg/dL — ABNORMAL HIGH (ref 70–99)
Glucose-Capillary: 103 mg/dL — ABNORMAL HIGH (ref 70–99)
Glucose-Capillary: 106 mg/dL — ABNORMAL HIGH (ref 70–99)
Glucose-Capillary: 119 mg/dL — ABNORMAL HIGH (ref 70–99)
Glucose-Capillary: 145 mg/dL — ABNORMAL HIGH (ref 70–99)
Glucose-Capillary: 84 mg/dL (ref 70–99)

## 2019-09-25 MED ORDER — PREDNISONE 10 MG (21) PO TBPK
10.0000 mg | ORAL_TABLET | ORAL | Status: AC
  Administered 2019-09-25: 10 mg via ORAL

## 2019-09-25 MED ORDER — PREDNISONE 10 MG (21) PO TBPK
10.0000 mg | ORAL_TABLET | Freq: Three times a day (TID) | ORAL | Status: DC
  Administered 2019-09-26: 10 mg via ORAL

## 2019-09-25 MED ORDER — PREDNISONE 10 MG (21) PO TBPK
20.0000 mg | ORAL_TABLET | Freq: Every evening | ORAL | Status: AC
  Administered 2019-09-25: 22:00:00 20 mg via ORAL

## 2019-09-25 MED ORDER — PREDNISONE 10 MG (21) PO TBPK: 10.0000 mg | ORAL_TABLET | Freq: Four times a day (QID) | ORAL | Status: DC

## 2019-09-25 MED ORDER — PREDNISONE 10 MG (21) PO TBPK: 20.0000 mg | ORAL_TABLET | Freq: Every evening | ORAL | Status: DC

## 2019-09-25 MED ORDER — PREDNISONE 10 MG (21) PO TBPK
20.0000 mg | ORAL_TABLET | Freq: Every morning | ORAL | Status: AC
  Administered 2019-09-25: 20 mg via ORAL
  Filled 2019-09-25: qty 21

## 2019-09-25 NOTE — Progress Notes (Signed)
Physical Therapy Treatment Patient Details Name: [name] MRN: [id number] DOB: 1941-01-30 Today's Date: 09/25/2019    History of Present Illness [name] is a 79 y.o. 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    PT Comments    Pt supine in bed with HOB elevated and wife at bedside upon arrival, c/o LLE spasm pain and requesting medication. Pt continues to require mod assist for supine to sit with assist to fully upright trunk and to scoot to EOB to place feet on floor. Attempted STS reps from EOB with RW, but unable to fully stand. Pt able to clear bottom 1-2 inches off bed, using rocking momentum, and cues to push through bil flat feet to maintain heel contact and upright trunk. Pt performs lateral scoot transfer EOB to bedside chair with mod assist to power over. Cues for head-hip relationship to encourage pt to bring weight forward while using BUE to assist in scooting hip laterally. Frequent verbal cues to avoid breath holding and for technique during treatment to improve performance. Pt tolerates seated therapeutic exercises with good LAQ control and verbal cues to engage glutes with resisted abduction using gait belt around thighs.  Patient will benefit from continued physical therapy in hospital and CIR to increase strength, balance, endurance for safe ADLs and gait.   Follow Up Recommendations  CIR     Equipment Recommendations  None recommended by PT    Recommendations for Other Services       Precautions /  Restrictions Precautions Precautions: Fall Restrictions Weight Bearing Restrictions: No    Mobility  Bed Mobility   Bed Mobility: Supine to Sit     Supine to sit: Mod assist;HOB elevated     General bed mobility comments: verbal cues for hand placement with rolling and to avoid breath holding, mod assist to upright trunk and scoot to EOB  Transfers Overall transfer level: Needs assistance Equipment used: Rolling walker (2 wheeled) Transfers: Lateral/Scoot Transfers          Lateral/Scoot Transfers: Mod assist;From elevated surface General transfer comment: attempted STS transfers with RW, pt using rocking momentum and able to clear bottom off bed 1-2 inches 5 reps, but unable to stand fully upright due to weakness, cues to push through bil flat feet; mod assist to scoot laterally from bed to bedside chair, cues for head-hip relationship and to push through BUE to assist in scooting hips  Ambulation/Gait             General Gait Details: not attempted   Stairs             Wheelchair Mobility    Modified Rankin (Stroke Patients Only)       Balance Overall balance assessment: Needs assistance Sitting-balance support: No upper extremity supported;Feet supported Sitting balance-Leahy Scale: Good Sitting balance - Comments: seated EOB, close SUPV           Cognition Arousal/Alertness: Awake/alert Behavior During Therapy: WFL for tasks assessed/performed Overall Cognitive Status: Within Functional Limits for tasks assessed  Exercises General Exercises - Lower Extremity Gluteal Sets: Seated;Both;10 reps(gait belt around thighs for added abd activation, 3  hold) Long Arc Quad: Seated;Both;5 reps(focus on controlled movement) Hip Flexion/Marching: Seated;Both;10 reps    General Comments        Pertinent Vitals/Pain Pain Assessment: Faces Faces Pain Scale: Hurts even more Pain Location: LLE Pain Descriptors /  Indicators: Grimacing;Spasm Pain Intervention(s): Limited activity within patient's tolerance;Monitored during session;RN gave pain meds during session    Home Living                      Prior Function            PT Goals (current goals can now be found in the care plan section) Acute Rehab PT Goals Patient Stated Goal: To get to CIR and maximize independence PT Goal Formulation: With patient Time For Goal Achievement: 10/04/19 Potential to Achieve Goals: Fair Progress towards PT goals: Progressing toward goals    Frequency    Min 3X/week      PT Plan Current plan remains appropriate    Co-evaluation              AM-PAC PT "6 Clicks" Mobility   Outcome Measure  Help needed turning from your back to your side while in a flat bed without using bedrails?: A Little Help needed moving from lying on your back to sitting on the side of a flat bed without using bedrails?: A Lot Help needed moving to and from a bed to a chair (including a wheelchair)?: A Lot Help needed standing up from a chair using your arms (e.g., wheelchair or bedside chair)?: A Lot Help needed to walk in hospital room?: Total Help needed climbing 3-5 steps with a railing? : Total 6 Click Score: 11    End of Session Equipment Utilized During Treatment: Gait belt Activity Tolerance: Patient tolerated treatment well Patient left: in chair;with call bell/phone within reach;with family/visitor present Nurse Communication: Mobility status PT Visit Diagnosis: Difficulty in walking, not elsewhere classified (R26.2);Muscle weakness (generalized) (M62.81)     Time: [id number] PT Time Calculation (min) (ACUTE ONLY): 33 min  Charges:  $Therapeutic Exercise: 8-22 mins $Therapeutic Activity: 23-37 mins                      Tori Alexina Niccoli PT, DPT 09/25/19, 3:43 PM [phone]

## 2019-09-25 NOTE — Care Management Important Message (Signed)
Important Message  Patient Details IM Letter given to Roque Lias SW Case Manager to present to the Patient Name: [name] MRN: [id number] Date of Birth: December 17, 1940   Medicare Important Message Given:  Yes     Kerin Salen 09/25/2019, 10:26 AM

## 2019-09-25 NOTE — Progress Notes (Signed)
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  Patient was admitted with generalized fatigue, severe, limiting mobility around the house.  Denied any fall or injury. Patient has chronic low back pain with radiation down the left leg that he does not think is significantly changed. Denied cough denies congestion denies fever. Denied loss of bowel or bladder denies loss peritoneal sensation. Has chronic numbness and weakness to his left leg that is unchanged. Denied recent head injury. At baseline (Prior to onset of current problems), patient reported being able to ambulate short distances with walker.  Chronic LEFT hemiparesis.  Chronic L LE spasm, stated approximately 2 weeks prior to presentation began to have increased L LE weakness with pain.  On the day of presentation, patient woke and was unable to ambulate at all.  Increased L LE pain/decreased sensation lateral aspect of L LE.  States negative increased back pain.  Negative increased stool/urine incontinence.   Subjective: No acute issues/events overnight. Patient sitting up eating breakfast this morning, feels quite well, pain essentially resolved at this point, looking forward to working with physical therapy and ultimate disposition to CIR.  indicates he feels somewhat constipated requesting stool softener.  Otherwise denies nausea, vomiting, diarrhea,  headache, fevers, chills.  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 -Baseline can ambulate short distance with walker.  At presentation - unable to ambulate at all, with increased LLE pain -MRI T-spine/L-spine; shows significant chronic damage, as well as acute changes see results below.  -4/2 with initiation of high-dose steroids decreased weakness, and muscle spasms.  -4/4-present: Continue to pursue inpatient rehab -tentatively accepted, awaiting insurance approval.  Acute on chronic nerve root impingement L-spine -Although patient extremely poor candidate for any surgical intervention, (acute on chronic), multiple comorbid conditions. -4/2 discussed case with Dr. Kristeen Miss neurosurgery who reviewed all MRI scans agrees patient patient is extremely poor candidate for any surgical procedure.  Agrees with ongoing medical management only -Solu-Medrol IV 60 mg TID through 09/24/19. Then transition to prolonged prednisone p.o. taper. -4/2 increase baclofen 15 mg TID -4/2 OxyContin 10 mg BID; Continue Vicodin PRN -Pain currently well controlled on current regimen  Myelopathy of C-spine cord  with cervical radiculopathy,  improving -Strength, range of motion, sensation improving drastically, essentially back to baseline per patient this morning.   Bowel/bladder incontinence -Chronic.  At baseline per patient   Chronic diastolic CHF ruled out -Per last echocardiogram CHF indeterminate.  However patient on multiple medications consistent with CHF. -3/31 echocardiogram; normal echocardiogram Filed Weights   09/22/19 0508 09/23/19 0502 09/25/19 0443  Weight: 95 kg 99.2 kg 101.5 kg  -Monitor closely for fluid overload  Essential HTN -resume home HTN meds as indicated (lasix, spironolactone, metoprolol)  THORACIC ascending aortic aneurysm (per past medical history in chart) -Patient states unaware of this condition -08/18/2017 reported as 4.2 cm -MRI of thoracic spine - unable to assess aorta given motion degradation.  Per guidelines patient should have yearly evaluation.    Chronic respiratory failure with hypoxia -Patient on 2 L O2 at home -Titrate O2 to maintain SPO2> 88% -Albuterol q 4hr as needed -Fluticasone-Umeclidin-Vilant 100-60 2.5-25 mcg/INH daily -DuoNeb QID PRN  OSA/OHS -CPAP per respiratory  Acute renal failure (baseline Cr 0.87) -tolerating PO - IV fluids stopped  DM type II controlled with complication -Hold home medications secondary to acute renal failure -4/1 increase resistant SSI (on steroids)  - likely able to wean off after DC given transition to PO steroids and resume home meds  LLE swelling - DVT ruled out -Patient with left calf swelling and pain to palpation; D-dimer negative.  No further work-up indicated at this point   Goals of care -4/2 PT/OT consult; Evaluate for CIR; SNF would be second choice   DVT prophylaxis: Lovenox Code Status: Full Disposition Plan: Likely disposition pending CIR approval by insurance  Consultants:  Neurosurgery consult Dr. Kristeen Miss  Procedures/Significant Events:  3/31 CT head Wo contrast;No evidence of acute infarction,  hemorrhage, hydrocephalus, extra-axial collection or mass lesion/mass effect. Scattered low-density changes within the periventricular and subcortical white matter compatible with chronic microvascular ischemic change. Mild diffuse cerebral volume loss.  3/31 MRI C-spine W/WO contrast; -Postoperative changes from prior ACDF at C3-C6. -mild to moderate diffuse spinal stenosis with moderate to severe bilateral foraminal narrowing  Overall, appearance is improved relative to preoperative exam from 2019. -Patchy cord signal abnormality at the level of C3-4, consistent with chronic myelomalacia. -Trace 2 mm retrolisthesis of C2 on C3, with 2-3 mm anterolisthesis of C6 on C7 and C7 on T1. Findings chronic and facet mediated. -Cord: Probable patchy cord signal abnormality within the cervical spinal cord at the level of C3-4, consistent with chronic myelomalacia related to previously seen compressive stenosis.  -C2-C3: Retrolisthesis. Mild diffuse disc bulge. Right>>LEFT. Moderate left with mild right C3 foraminal stenosis. -C3-C4: Prior fusion. Residual posterior endplate osseous ridging indents the ventral thecal sac with persistent mild to moderate spinal stenosis.  -Persistent moderate to severe right worse than left C4 foraminal narrowing. Appearance is improved relative to previous exam. -C4-C5: Prior fusion. Residual posterior endplate osseous ridging indents the ventral thecal sac with persistent mild spinal stenosis. -Moderate bilateral facet hypertrophy. Persistent moderate to severe bilateral foraminal narrowing. Appearance is slightly improved from previous exam. -C5-C6: Prior fusion. Residual central endplate osseous ridging indents the ventral thecal sac, contacting and mildly flattening the cervical spinal cord. Persistent mild to moderate spinal stenosis,improved from previous.  -Moderate to severe right worse than left C6 foraminal narrowing, also improved. -C6-C7: Anterolisthesis.  Mild disc bulge with bilateral uncovertebral hypertrophy.  -Moderate bilateral C7 foraminal narrowing, grossly stable. -C7-T1: Anterolisthesis. Negative interspace. Bilateral facet hypertrophy. No significant spinal stenosis. Foramina remain patent. -  T1-2: Diffuse disc bulge. Posterior element hypertrophy. Resultant mild spinal stenosis with moderate left worse than right foraminal narrowing, grossly similar. 3/31 MRI T-spine;- area of hyperintense T2-weighted signal within the lower thoracic spinal cord.Incompletely evaluated due to motion artifact and lack of IV contrast.  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 4/1 MRI L-spine;-Lumbar spondylosis, degenerative disc disease, and congenitally short pedicles causing prominent impingement at L2-3 and L3-4; -Moderate impingement at L1-2 and L4-5; and  -Mild impingement T12-L1 and L5-S1,  -Impingement at L1-2 and L2-3 is minimally worsened from the prior exam of 07/24/2017. -Questionable accentuated central T2 signal in the distal thoracic cord, as suggested on prior thoracic spine MRI 4/2 Echocardiogram;LVEF= 55 to 60%.  -Normal left ventricle diastolic parameters.    Continuous Infusions: . sodium chloride Stopped (09/24/19 0912)    Objective: Vitals:   09/25/19 0443 09/25/19 0804 09/25/19 0805 09/25/19 1408  BP:    125/79  Pulse:    67  Resp:    17  Temp:    97.8 F (36.6 C)  TempSrc:    Oral  SpO2:  94% 94% 94%  Weight: 101.5 kg     Height:        Intake/Output Summary (Last 24 hours) at 09/25/2019 1434 Last data filed at 09/25/2019 1300 Gross per 24 hour  Intake 2915.63 ml  Output 3175 ml  Net -259.37 ml   Filed Weights   09/22/19 0508 09/23/19 0502 09/25/19 0443  Weight: 95 kg 99.2 kg 101.5 kg   Physical Exam:  General:  Pleasantly resting in bed, No acute distress. HEENT:  Normocephalic atraumatic.  Sclerae nonicteric, noninjected.  Extraocular  movements intact bilaterally. Neck:  Without mass or deformity.  Trachea is midline. Lungs:  Clear to auscultate bilaterally without rhonchi, wheeze, or rales. Heart:  Regular rate and rhythm.  Without murmurs, rubs, or gallops. Abdomen:  Soft, nontender, nondistended.  Without guarding or rebound. Extremities: Without cyanosis, clubbing, edema, or obvious deformity. Vascular:  Dorsalis pedis and posterior tibial pulses palpable bilaterally.   CBC: Recent Labs  Lab 09/19/19 0509 09/20/19 0533 09/21/19 0532 09/22/19 0532 09/23/19 0519  WBC 8.1 11.6* 13.5* 10.3 9.6  NEUTROABS 7.4 10.2* 11.7* 8.5* 8.1*  HGB 13.8 12.3* 12.5* 12.2* 12.9*  HCT 42.7 38.1* 38.5* 38.1* 40.0  MCV 93.8 92.0 92.8 92.7 91.7  PLT 218 231 217 209 [id number]   Basic Metabolic Panel: Recent Labs  Lab 09/19/19 0509 09/20/19 0533 09/21/19 0532 09/22/19 0532 09/24/19 0522  NA 141 141 141 140  --   K 4.6 4.6 4.4 4.4  --   CL 106 106 106 106  --   CO2 25 26 27 26   --   GLUCOSE 148* 140* 125* 124*  --   BUN 23 31* 28* 28*  --   CREATININE 0.93 0.92 0.90 0.80 0.71  CALCIUM 9.1 8.5* 8.1* 7.5*  --   MG 2.2 2.3 2.4 2.6*  --   PHOS 4.1 3.0 2.1* 2.4*  --    GFR: Estimated Creatinine Clearance: 86.4 mL/min (by C-G formula based on SCr of 0.71 mg/dL). Liver Function Tests: Recent Labs  Lab 09/19/19 0509 09/20/19 0533 09/21/19 0532 09/22/19 0532  AST 14* 15 17 25   ALT 10 9 13 19   ALKPHOS 63 55 52 49  BILITOT 0.7 0.4 0.8 0.5  PROT 7.1 6.4* 6.3* 6.0*  ALBUMIN 3.8 3.5 3.4* 3.2*   No results for input(s): LIPASE, AMYLASE in the last 168  hours. No results for input(s): AMMONIA in the last 168 hours. Coagulation Profile: No results for input(s): INR, PROTIME in the last 168 hours. Cardiac Enzymes: No results for input(s): CKTOTAL, CKMB, CKMBINDEX, TROPONINI in the last 168 hours. BNP (last 3 results) Recent Labs    02/14/19 1001  PROBNP 482   HbA1C: No results for input(s): HGBA1C in the last 72  hours. CBG: Recent Labs  Lab 09/24/19 1955 09/24/19 2325 09/25/19 0337 09/25/19 0750 09/25/19 1204  GLUCAP 164* 121* 106* 103* 84   Lipid Profile: No results for input(s): CHOL, HDL, LDLCALC, TRIG, CHOLHDL, LDLDIRECT in the last 72 hours. Thyroid Function Tests: No results for input(s): TSH, T4TOTAL, FREET4, T3FREE, THYROIDAB in the last 72 hours. Anemia Panel: No results for input(s): VITAMINB12, FOLATE, FERRITIN, TIBC, IRON, RETICCTPCT in the last 72 hours. Sepsis Labs: No results for input(s): PROCALCITON, LATICACIDVEN in the last 168 hours.  Recent Results (from the past 240 hour(s))  Culture, Urine     Status: Abnormal   Collection Time: 09/17/19  1:05 PM   Specimen: Urine, Clean Catch  Result Value Ref Range Status   Specimen Description   Final    URINE, CLEAN CATCH Performed at [facility], [place] [address] [place], [place] [address]    Special Requests   Final    NONE Performed at [facility], [place] [address] [place], [place] [address]    Culture (A)  Final    30,000 COLONIES/mL DIPHTHEROIDS(CORYNEBACTERIUM SPECIES) Standardized susceptibility testing for this organism is not available. Performed at [facility], [place] [address], [place], [place] [address]    Report Status 09/18/2019 FINAL  Final  SARS CORONAVIRUS 2 (TAT 6-24 HRS) Nasopharyngeal Nasopharyngeal Swab     Status: None   Collection Time: 09/17/19  5:49 PM   Specimen: Nasopharyngeal Swab  Result Value Ref Range Status   SARS Coronavirus 2 NEGATIVE NEGATIVE Final    Comment: (NOTE) SARS-CoV-2 target nucleic acids are NOT DETECTED. The SARS-CoV-2 RNA is generally detectable in upper and lower respiratory specimens during the acute phase of infection. Negative results do not preclude SARS-CoV-2 infection, do not rule out co-infections with other pathogens, and should not be used as the sole basis for treatment or other patient management  decisions. Negative results must be combined with clinical observations, patient history, and epidemiological information. The expected result is Negative. Fact Sheet for Patients: SugarRoll.be Fact Sheet for Healthcare Providers: https://www.woods-mathews.com/ This test is not yet approved or cleared by the [place] FDA and  has been authorized for detection and/or diagnosis of SARS-CoV-2 by FDA under an Emergency Use Authorization (EUA). This EUA will remain  in effect (meaning this test can be used) for the duration of the COVID-19 declaration under Section 56 4(b)(1) of the Act, 21 U.S.C. section 360bbb-3(b)(1), unless the authorization is terminated or revoked sooner. Performed at [facility], [place] [address], [place], [place] [address]   Culture, blood (routine x 2)     Status: None   Collection Time: 09/17/19  6:55 PM   Specimen: BLOOD RIGHT FOREARM  Result Value Ref Range Status   Specimen Description   Final    BLOOD RIGHT FOREARM Performed at [place] [address], [place], [place] [address]    Special Requests   Final    BOTTLES DRAWN AEROBIC ONLY Blood Culture results may not be optimal due to an inadequate volume of blood received in culture bottles Performed at  [facility], [place] [address] [place], [place] [address]    Culture   Final    NO GROWTH 5 DAYS Performed at [facility], [place] [address] [place], [place] [address]    Report Status 09/22/2019 FINAL  Final  Culture, blood (routine x 2)     Status: None   Collection Time: 09/17/19  6:55 PM   Specimen: BLOOD LEFT FOREARM  Result Value Ref Range Status   Specimen Description   Final    BLOOD LEFT FOREARM Performed at [place] [address] [place], [place] [address]    Special Requests   Final    BOTTLES DRAWN AEROBIC AND ANAEROBIC Blood Culture adequate volume Performed at  [facility] [address], [place], [place] [address]    Culture   Final    NO GROWTH 5 DAYS Performed at [facility], [place] [address], [place], [place] [address]    Report Status 09/22/2019 FINAL  Final    Radiology Studies: No results found.  Scheduled Meds: . allopurinol  300 mg Oral Daily  . aspirin EC  81 mg Oral Daily  . baclofen  15 mg Oral TID  . carbidopa-levodopa  1 tablet Oral BID  . colchicine  0.6 mg Oral Daily  . enoxaparin (LOVENOX) injection  40 mg Subcutaneous Daily  . fluticasone furoate-vilanterol  1 puff Inhalation Daily  . gabapentin  100 mg Oral QHS  . insulin aspart  0-20 Units Subcutaneous Q4H  . latanoprost  1 drop Both Eyes QHS  . levETIRAcetam  250 mg Oral BID  . levothyroxine  125 mcg Oral QAC breakfast  . LORazepam  2 mg Intravenous Once  .  morphine injection  4 mg Intravenous Once  . multivitamin with minerals  1 tablet Oral Daily  . ondansetron (ZOFRAN) IV  4 mg Intravenous Once  . oxyCODONE  10 mg Oral Q12H  . pantoprazole  40 mg Oral QPM  . polyethylene glycol  17 g Oral BID  . simvastatin  40 mg Oral q1800  . umeclidinium bromide  1 puff Inhalation Daily   Continuous Infusions: . sodium chloride Stopped (09/24/19 0912)     LOS: 8 days   Time spent: 25 min  Little Ishikawa, DO [facility]  If 7PM-7AM, please contact night-coverage [email or web address]  09/25/2019, 2:34 PM

## 2019-09-25 NOTE — Progress Notes (Signed)
Inpatient Rehabilitation-Admissions Coordinator   I have received insurance approval for CIR for this patient. I do not have a bed available for him today but will follow up tomorrow for possible admit, pending bed availability.   Raechel Ache, OTR/L  Rehab Admissions Coordinator  [phone] 09/25/2019 12:50 PM

## 2019-09-26 ENCOUNTER — Other Ambulatory Visit: Payer: Self-pay

## 2019-09-26 ENCOUNTER — Inpatient Hospital Stay (HOSPITAL_COMMUNITY)
Admission: RE | Admit: 2019-09-26 | Discharge: 2019-10-28 | DRG: 052 | Disposition: A | Discharge status: Other Acute Inpatient Hospital | Payer: Medicare Other | Source: Other Acute Inpatient Hospital | Attending: Physical Medicine and Rehabilitation | Admitting: Physical Medicine and Rehabilitation

## 2019-09-26 ENCOUNTER — Encounter (HOSPITAL_COMMUNITY): Payer: Self-pay | Admitting: Physical Medicine and Rehabilitation

## 2019-09-26 DIAGNOSIS — M7989 Other specified soft tissue disorders: Secondary | ICD-10-CM | POA: Diagnosis not present

## 2019-09-26 DIAGNOSIS — J440 Chronic obstructive pulmonary disease with acute lower respiratory infection: Secondary | ICD-10-CM | POA: Diagnosis not present

## 2019-09-26 DIAGNOSIS — Z85850 Personal history of malignant neoplasm of thyroid: Secondary | ICD-10-CM | POA: Diagnosis not present

## 2019-09-26 DIAGNOSIS — R29898 Other symptoms and signs involving the musculoskeletal system: Secondary | ICD-10-CM

## 2019-09-26 DIAGNOSIS — I712 Thoracic aortic aneurysm, without rupture: Secondary | ICD-10-CM | POA: Diagnosis present

## 2019-09-26 DIAGNOSIS — S61210A Laceration without foreign body of right index finger without damage to nail, initial encounter: Secondary | ICD-10-CM | POA: Diagnosis present

## 2019-09-26 DIAGNOSIS — I110 Hypertensive heart disease with heart failure: Secondary | ICD-10-CM | POA: Diagnosis present

## 2019-09-26 DIAGNOSIS — R0902 Hypoxemia: Secondary | ICD-10-CM

## 2019-09-26 DIAGNOSIS — L89326 Pressure-induced deep tissue damage of left buttock: Secondary | ICD-10-CM | POA: Diagnosis not present

## 2019-09-26 DIAGNOSIS — K592 Neurogenic bowel, not elsewhere classified: Secondary | ICD-10-CM | POA: Diagnosis present

## 2019-09-26 DIAGNOSIS — G8194 Hemiplegia, unspecified affecting left nondominant side: Secondary | ICD-10-CM | POA: Diagnosis present

## 2019-09-26 DIAGNOSIS — E785 Hyperlipidemia, unspecified: Secondary | ICD-10-CM | POA: Diagnosis present

## 2019-09-26 DIAGNOSIS — G992 Myelopathy in diseases classified elsewhere: Secondary | ICD-10-CM

## 2019-09-26 DIAGNOSIS — G43909 Migraine, unspecified, not intractable, without status migrainosus: Secondary | ICD-10-CM | POA: Diagnosis not present

## 2019-09-26 DIAGNOSIS — L89520 Pressure ulcer of left ankle, unstageable: Secondary | ICD-10-CM | POA: Diagnosis not present

## 2019-09-26 DIAGNOSIS — I10 Essential (primary) hypertension: Secondary | ICD-10-CM | POA: Diagnosis not present

## 2019-09-26 DIAGNOSIS — N319 Neuromuscular dysfunction of bladder, unspecified: Secondary | ICD-10-CM

## 2019-09-26 DIAGNOSIS — Z87891 Personal history of nicotine dependence: Secondary | ICD-10-CM

## 2019-09-26 DIAGNOSIS — Z713 Dietary counseling and surveillance: Secondary | ICD-10-CM

## 2019-09-26 DIAGNOSIS — J986 Disorders of diaphragm: Secondary | ICD-10-CM | POA: Diagnosis present

## 2019-09-26 DIAGNOSIS — I872 Venous insufficiency (chronic) (peripheral): Secondary | ICD-10-CM | POA: Diagnosis present

## 2019-09-26 DIAGNOSIS — G4733 Obstructive sleep apnea (adult) (pediatric): Secondary | ICD-10-CM | POA: Diagnosis not present

## 2019-09-26 DIAGNOSIS — E890 Postprocedural hypothyroidism: Secondary | ICD-10-CM | POA: Diagnosis present

## 2019-09-26 DIAGNOSIS — J189 Pneumonia, unspecified organism: Secondary | ICD-10-CM | POA: Diagnosis not present

## 2019-09-26 DIAGNOSIS — Z515 Encounter for palliative care: Secondary | ICD-10-CM | POA: Diagnosis not present

## 2019-09-26 DIAGNOSIS — G959 Disease of spinal cord, unspecified: Secondary | ICD-10-CM | POA: Diagnosis present

## 2019-09-26 DIAGNOSIS — E11649 Type 2 diabetes mellitus with hypoglycemia without coma: Secondary | ICD-10-CM | POA: Diagnosis not present

## 2019-09-26 DIAGNOSIS — J9621 Acute and chronic respiratory failure with hypoxia: Secondary | ICD-10-CM | POA: Diagnosis not present

## 2019-09-26 DIAGNOSIS — Z7984 Long term (current) use of oral hypoglycemic drugs: Secondary | ICD-10-CM

## 2019-09-26 DIAGNOSIS — I5032 Chronic diastolic (congestive) heart failure: Secondary | ICD-10-CM

## 2019-09-26 DIAGNOSIS — Z981 Arthrodesis status: Secondary | ICD-10-CM

## 2019-09-26 DIAGNOSIS — G8222 Paraplegia, incomplete: Secondary | ICD-10-CM | POA: Diagnosis present

## 2019-09-26 DIAGNOSIS — G2581 Restless legs syndrome: Secondary | ICD-10-CM | POA: Diagnosis present

## 2019-09-26 DIAGNOSIS — R159 Full incontinence of feces: Secondary | ICD-10-CM | POA: Diagnosis not present

## 2019-09-26 DIAGNOSIS — Z66 Do not resuscitate: Secondary | ICD-10-CM | POA: Diagnosis present

## 2019-09-26 DIAGNOSIS — J9611 Chronic respiratory failure with hypoxia: Secondary | ICD-10-CM | POA: Diagnosis present

## 2019-09-26 DIAGNOSIS — R0602 Shortness of breath: Secondary | ICD-10-CM

## 2019-09-26 DIAGNOSIS — E669 Obesity, unspecified: Secondary | ICD-10-CM | POA: Diagnosis present

## 2019-09-26 DIAGNOSIS — R062 Wheezing: Secondary | ICD-10-CM

## 2019-09-26 DIAGNOSIS — R5381 Other malaise: Secondary | ICD-10-CM | POA: Diagnosis present

## 2019-09-26 DIAGNOSIS — M5116 Intervertebral disc disorders with radiculopathy, lumbar region: Secondary | ICD-10-CM | POA: Diagnosis present

## 2019-09-26 DIAGNOSIS — D72829 Elevated white blood cell count, unspecified: Secondary | ICD-10-CM

## 2019-09-26 DIAGNOSIS — J9811 Atelectasis: Secondary | ICD-10-CM | POA: Diagnosis not present

## 2019-09-26 DIAGNOSIS — M4802 Spinal stenosis, cervical region: Secondary | ICD-10-CM | POA: Diagnosis not present

## 2019-09-26 DIAGNOSIS — K219 Gastro-esophageal reflux disease without esophagitis: Secondary | ICD-10-CM | POA: Diagnosis present

## 2019-09-26 DIAGNOSIS — Y95 Nosocomial condition: Secondary | ICD-10-CM | POA: Diagnosis not present

## 2019-09-26 DIAGNOSIS — M48062 Spinal stenosis, lumbar region with neurogenic claudication: Secondary | ICD-10-CM | POA: Diagnosis present

## 2019-09-26 DIAGNOSIS — M4714 Other spondylosis with myelopathy, thoracic region: Secondary | ICD-10-CM

## 2019-09-26 DIAGNOSIS — L89156 Pressure-induced deep tissue damage of sacral region: Secondary | ICD-10-CM | POA: Diagnosis not present

## 2019-09-26 DIAGNOSIS — Z22322 Carrier or suspected carrier of Methicillin resistant Staphylococcus aureus: Secondary | ICD-10-CM

## 2019-09-26 DIAGNOSIS — Z9981 Dependence on supplemental oxygen: Secondary | ICD-10-CM

## 2019-09-26 DIAGNOSIS — Z79899 Other long term (current) drug therapy: Secondary | ICD-10-CM

## 2019-09-26 DIAGNOSIS — J302 Other seasonal allergic rhinitis: Secondary | ICD-10-CM | POA: Diagnosis present

## 2019-09-26 DIAGNOSIS — M5106 Intervertebral disc disorders with myelopathy, lumbar region: Secondary | ICD-10-CM | POA: Diagnosis not present

## 2019-09-26 DIAGNOSIS — G8929 Other chronic pain: Secondary | ICD-10-CM | POA: Diagnosis present

## 2019-09-26 DIAGNOSIS — R252 Cramp and spasm: Secondary | ICD-10-CM

## 2019-09-26 DIAGNOSIS — Z96652 Presence of left artificial knee joint: Secondary | ICD-10-CM | POA: Diagnosis present

## 2019-09-26 DIAGNOSIS — Z9989 Dependence on other enabling machines and devices: Secondary | ICD-10-CM | POA: Diagnosis not present

## 2019-09-26 DIAGNOSIS — Z20822 Contact with and (suspected) exposure to covid-19: Secondary | ICD-10-CM | POA: Diagnosis not present

## 2019-09-26 DIAGNOSIS — R0989 Other specified symptoms and signs involving the circulatory and respiratory systems: Secondary | ICD-10-CM

## 2019-09-26 DIAGNOSIS — J9601 Acute respiratory failure with hypoxia: Secondary | ICD-10-CM | POA: Diagnosis not present

## 2019-09-26 DIAGNOSIS — G253 Myoclonus: Secondary | ICD-10-CM | POA: Diagnosis present

## 2019-09-26 DIAGNOSIS — R531 Weakness: Secondary | ICD-10-CM | POA: Diagnosis not present

## 2019-09-26 DIAGNOSIS — M4726 Other spondylosis with radiculopathy, lumbar region: Secondary | ICD-10-CM | POA: Diagnosis present

## 2019-09-26 DIAGNOSIS — Z79890 Hormone replacement therapy (postmenopausal): Secondary | ICD-10-CM

## 2019-09-26 DIAGNOSIS — Z993 Dependence on wheelchair: Secondary | ICD-10-CM

## 2019-09-26 DIAGNOSIS — L8990 Pressure ulcer of unspecified site, unspecified stage: Secondary | ICD-10-CM | POA: Diagnosis present

## 2019-09-26 DIAGNOSIS — M48061 Spinal stenosis, lumbar region without neurogenic claudication: Secondary | ICD-10-CM

## 2019-09-26 DIAGNOSIS — Z6834 Body mass index (BMI) 34.0-34.9, adult: Secondary | ICD-10-CM

## 2019-09-26 DIAGNOSIS — L89523 Pressure ulcer of left ankle, stage 3: Secondary | ICD-10-CM

## 2019-09-26 DIAGNOSIS — M4804 Spinal stenosis, thoracic region: Secondary | ICD-10-CM | POA: Diagnosis present

## 2019-09-26 DIAGNOSIS — M62838 Other muscle spasm: Secondary | ICD-10-CM

## 2019-09-26 DIAGNOSIS — R2689 Other abnormalities of gait and mobility: Secondary | ICD-10-CM | POA: Diagnosis present

## 2019-09-26 LAB — GLUCOSE, CAPILLARY
Glucose-Capillary: 104 mg/dL — ABNORMAL HIGH (ref 70–99)
Glucose-Capillary: 128 mg/dL — ABNORMAL HIGH (ref 70–99)
Glucose-Capillary: 138 mg/dL — ABNORMAL HIGH (ref 70–99)
Glucose-Capillary: 151 mg/dL — ABNORMAL HIGH (ref 70–99)
Glucose-Capillary: 80 mg/dL (ref 70–99)
Glucose-Capillary: 93 mg/dL (ref 70–99)

## 2019-09-26 MED ORDER — GABAPENTIN 100 MG PO CAPS
100.0000 mg | ORAL_CAPSULE | Freq: Every day | ORAL | Status: DC
  Administered 2019-09-26 – 2019-10-27 (×33): 100 mg via ORAL
  Filled 2019-09-26 (×33): qty 1

## 2019-09-26 MED ORDER — HYDROCODONE-ACETAMINOPHEN 5-325 MG PO TABS
1.0000 | ORAL_TABLET | Freq: Three times a day (TID) | ORAL | Status: DC | PRN
  Administered 2019-09-26 – 2019-10-28 (×24): 1 via ORAL
  Filled 2019-09-26 (×25): qty 1

## 2019-09-26 MED ORDER — ENOXAPARIN SODIUM 40 MG/0.4ML ~~LOC~~ SOLN
40.0000 mg | Freq: Every day | SUBCUTANEOUS | Status: DC
  Administered 2019-09-27 – 2019-10-28 (×32): 40 mg via SUBCUTANEOUS
  Filled 2019-09-26 (×32): qty 0.4

## 2019-09-26 MED ORDER — CARBIDOPA-LEVODOPA 10-100 MG PO TABS
1.0000 | ORAL_TABLET | Freq: Two times a day (BID) | ORAL | Status: DC
  Administered 2019-09-26 – 2019-10-28 (×64): 1 via ORAL
  Filled 2019-09-26 (×66): qty 1

## 2019-09-26 MED ORDER — ENOXAPARIN SODIUM 40 MG/0.4ML ~~LOC~~ SOLN: 40.0000 mg | SUBCUTANEOUS | Status: DC

## 2019-09-26 MED ORDER — INSULIN ASPART 100 UNIT/ML ~~LOC~~ SOLN
0.0000 [IU] | SUBCUTANEOUS | Status: DC
  Administered 2019-09-26: 3 [IU] via SUBCUTANEOUS
  Administered 2019-09-26: 4 [IU] via SUBCUTANEOUS
  Administered 2019-09-27: 17:00:00 3 [IU] via SUBCUTANEOUS
  Administered 2019-09-27: 4 [IU] via SUBCUTANEOUS

## 2019-09-26 MED ORDER — ASPIRIN EC 81 MG PO TBEC
81.0000 mg | DELAYED_RELEASE_TABLET | Freq: Every day | ORAL | Status: DC
  Administered 2019-09-27 – 2019-10-28 (×32): 81 mg via ORAL
  Filled 2019-09-26 (×33): qty 1

## 2019-09-26 MED ORDER — SUMATRIPTAN SUCCINATE 50 MG PO TABS
50.0000 mg | ORAL_TABLET | ORAL | Status: DC | PRN
  Administered 2019-10-05 – 2019-10-07 (×2): 50 mg via ORAL
  Filled 2019-09-26 (×4): qty 1

## 2019-09-26 MED ORDER — COLCHICINE 0.6 MG PO TABS
0.6000 mg | ORAL_TABLET | Freq: Every day | ORAL | Status: DC
  Administered 2019-09-27 – 2019-10-28 (×32): 0.6 mg via ORAL
  Filled 2019-09-26 (×32): qty 1

## 2019-09-26 MED ORDER — PANTOPRAZOLE SODIUM 40 MG PO TBEC
40.0000 mg | DELAYED_RELEASE_TABLET | Freq: Every evening | ORAL | Status: DC
  Administered 2019-09-27 – 2019-10-27 (×30): 40 mg via ORAL
  Filled 2019-09-26 (×32): qty 1

## 2019-09-26 MED ORDER — ALBUTEROL SULFATE (2.5 MG/3ML) 0.083% IN NEBU
2.5000 mg | INHALATION_SOLUTION | RESPIRATORY_TRACT | Status: DC | PRN
  Administered 2019-09-26 – 2019-10-21 (×7): 2.5 mg via RESPIRATORY_TRACT
  Filled 2019-09-26 (×7): qty 3

## 2019-09-26 MED ORDER — SALINE SPRAY 0.65 % NA SOLN
1.0000 | NASAL | Status: DC | PRN
  Administered 2019-10-06: 1 via NASAL
  Filled 2019-09-26: qty 44

## 2019-09-26 MED ORDER — FLUTICASONE FUROATE-VILANTEROL 200-25 MCG/INH IN AEPB
1.0000 | INHALATION_SPRAY | Freq: Every day | RESPIRATORY_TRACT | Status: DC
  Administered 2019-09-27 – 2019-10-28 (×30): 1 via RESPIRATORY_TRACT
  Filled 2019-09-26 (×3): qty 28

## 2019-09-26 MED ORDER — POLYETHYLENE GLYCOL 3350 17 G PO PACK
17.0000 g | PACK | Freq: Two times a day (BID) | ORAL | Status: DC
  Administered 2019-09-26 – 2019-10-27 (×46): 17 g via ORAL
  Filled 2019-09-26 (×62): qty 1

## 2019-09-26 MED ORDER — IPRATROPIUM-ALBUTEROL 0.5-2.5 (3) MG/3ML IN SOLN
3.0000 mL | Freq: Four times a day (QID) | RESPIRATORY_TRACT | Status: DC | PRN
  Administered 2019-10-06: 3 mL via RESPIRATORY_TRACT
  Filled 2019-09-26: qty 3

## 2019-09-26 MED ORDER — LEVOTHYROXINE SODIUM 25 MCG PO TABS
125.0000 ug | ORAL_TABLET | Freq: Every day | ORAL | Status: DC
  Administered 2019-09-27 – 2019-10-28 (×32): 125 ug via ORAL
  Filled 2019-09-26 (×32): qty 1

## 2019-09-26 MED ORDER — SORBITOL 70 % SOLN
30.0000 mL | Freq: Every day | Status: DC | PRN
  Administered 2019-09-28 – 2019-10-11 (×2): 30 mL via ORAL
  Filled 2019-09-26 (×2): qty 30

## 2019-09-26 MED ORDER — ALLOPURINOL 300 MG PO TABS
300.0000 mg | ORAL_TABLET | Freq: Every day | ORAL | Status: DC
  Administered 2019-09-27 – 2019-10-28 (×32): 300 mg via ORAL
  Filled 2019-09-26 (×32): qty 1

## 2019-09-26 MED ORDER — BACLOFEN 5 MG HALF TABLET
15.0000 mg | ORAL_TABLET | Freq: Three times a day (TID) | ORAL | Status: DC
  Administered 2019-09-26 – 2019-10-14 (×53): 15 mg via ORAL
  Filled 2019-09-26 (×54): qty 1

## 2019-09-26 MED ORDER — LEVETIRACETAM 250 MG PO TABS
250.0000 mg | ORAL_TABLET | Freq: Two times a day (BID) | ORAL | Status: DC
  Administered 2019-09-26 – 2019-10-28 (×64): 250 mg via ORAL
  Filled 2019-09-26 (×67): qty 1

## 2019-09-26 MED ORDER — SIMVASTATIN 20 MG PO TABS
40.0000 mg | ORAL_TABLET | Freq: Every day | ORAL | Status: DC
  Administered 2019-09-26 – 2019-10-27 (×31): 40 mg via ORAL
  Filled 2019-09-26 (×32): qty 2

## 2019-09-26 MED ORDER — ADULT MULTIVITAMIN W/MINERALS CH
1.0000 | ORAL_TABLET | Freq: Every day | ORAL | Status: DC
  Administered 2019-09-27 – 2019-10-28 (×31): 1 via ORAL
  Filled 2019-09-26 (×32): qty 1

## 2019-09-26 MED ORDER — LATANOPROST 0.005 % OP SOLN
1.0000 [drp] | Freq: Every day | OPHTHALMIC | Status: DC
  Administered 2019-09-26 – 2019-10-27 (×32): 1 [drp] via OPHTHALMIC
  Filled 2019-09-26: qty 2.5

## 2019-09-26 MED ORDER — PREDNISONE 10 MG (21) PO TBPK
20.0000 mg | ORAL_TABLET | Freq: Every evening | ORAL | Status: AC
  Administered 2019-09-26: 20 mg via ORAL

## 2019-09-26 MED ORDER — TRAZODONE HCL 50 MG PO TABS: 50.0000 mg | ORAL_TABLET | Freq: Every evening | ORAL | Status: DC | PRN

## 2019-09-26 MED ORDER — PREDNISONE 10 MG PO TABS: ORAL_TABLET | ORAL | 0 refills | Status: DC

## 2019-09-26 MED ORDER — UMECLIDINIUM BROMIDE 62.5 MCG/INH IN AEPB
1.0000 | INHALATION_SPRAY | Freq: Every day | RESPIRATORY_TRACT | Status: DC
  Administered 2019-09-28 – 2019-10-28 (×29): 1 via RESPIRATORY_TRACT
  Filled 2019-09-26 (×5): qty 7

## 2019-09-26 MED ORDER — NITROGLYCERIN 0.4 MG SL SUBL: 0.4000 mg | SUBLINGUAL_TABLET | SUBLINGUAL | Status: DC | PRN

## 2019-09-26 MED ORDER — OXYCODONE HCL ER 10 MG PO T12A: 10.0000 mg | EXTENDED_RELEASE_TABLET | Freq: Two times a day (BID) | ORAL | 0 refills | Status: DC

## 2019-09-26 MED ORDER — PREDNISONE 10 MG (21) PO TBPK
10.0000 mg | ORAL_TABLET | Freq: Three times a day (TID) | ORAL | Status: AC
  Administered 2019-09-26 – 2019-09-27 (×2): 10 mg via ORAL
  Filled 2019-09-26: qty 21

## 2019-09-26 MED ORDER — OXYCODONE HCL ER 10 MG PO T12A
10.0000 mg | EXTENDED_RELEASE_TABLET | Freq: Two times a day (BID) | ORAL | Status: DC
  Administered 2019-09-26 – 2019-10-28 (×64): 10 mg via ORAL
  Filled 2019-09-26 (×64): qty 1

## 2019-09-26 MED ORDER — PREDNISONE 10 MG (21) PO TBPK
10.0000 mg | ORAL_TABLET | Freq: Four times a day (QID) | ORAL | Status: AC
  Administered 2019-09-27 – 2019-09-30 (×9): 10 mg via ORAL

## 2019-09-26 NOTE — Progress Notes (Signed)
Social Work Assessment and Plan   Patient Details  Name: [name] MRN: [id number] Date of Birth: September 26, 1940  Today's Date: 09/26/2019  Problem List:  Patient Active Problem List   Diagnosis Date Noted  . Cervical myelopathy ([place]) 09/26/2019  . Diabetes mellitus type 2, controlled, with complications ([place]) 71/24/5809  . Obese 09/17/2019  . Thoracic aortic aneurysm ([place]) 09/17/2019  . Bowel incontinence 09/17/2019  . Bladder incontinence 09/17/2019  . Right hemiparesis ([place]) 09/17/2019  . Acute renal failure (ARF) ([place]) 09/17/2019  . Left hemiparesis ([place]) 09/17/2019  . Chronic respiratory failure with hypoxia ([place]) 09/17/2019  . Syncope and collapse 09/17/2019  . Chronic diastolic CHF (congestive heart failure) ([place]) 09/04/2019  . PVD (peripheral vascular disease) ([place]) 02/14/2019  . Essential hypertension 02/13/2019  . Non-insulin treated type 2 diabetes mellitus ([place]) 02/13/2019  . Severe sepsis ([place]) 01/17/2019  . Community acquired pneumonia of right lower lobe of lung 01/17/2019  . Postural urinary incontinence 05/20/2018  . 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 Myoclonic jerking 05/20/2018  . 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 History of respiratory failure 08/18/2017  . Hypokalemia 08/18/2017  . Leukocytosis 08/18/2017  . Muscle atrophy of lower extremity 08/09/2017  . Bowel and bladder incontinence 08/09/2017  . Right-sided muscle weakness 08/09/2017  . Neuropathy 08/09/2017  . 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/2017  . Abnormality of gait 07/12/2017  . Myoclonic jerkings, 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/2017  . Acquired right foot drop 07/12/2017  . UTI (urinary tract infection) due to Enterococcus 07/12/2017  . Pressure injury of skin 06/21/2017  . Acute cystitis 06/20/2017  . Generalized weakness 06/20/2017  . Dehydration 06/20/2017  . Dyspnea 12/26/2016   . Chronic cough 12/26/2016  . Hypersomnia with sleep apnea 10/30/2016  . CSA (central sleep apnea) 10/30/2016  . Wheezing symptom 10/30/2016  . 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 RLS (restless legs syndrome) 08/25/2014  . Primary gout 08/25/2014  . OSA on CPAP 08/25/2014  . Severe obesity (BMI >= 40) ([place]) 08/25/2014  . Obesity (BMI 30-39.9) 02/18/2013  . 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 S/P TKR (total knee replacement) 11/29/2012  . History of thyroid cancer    Past Medical History:  Past Medical History:  Diagnosis Date  . Arthritis   . Barrett esophagus   . Bronchitis   . Carotid artery occlusion   . COPD (chronic obstructive pulmonary disease) ([place])   . Diabetes mellitus without complication ([place])   . GERD (gastroesophageal reflux disease)   . Headache    migraines  . History of thyroid cancer 2008  . Hypertension   . Restless leg   . Sleep apnea with use of continuous positive airway pressure (CPAP)    2011 [facility] , AHI  77cn central and obstrcutive. 16 cm water , 3 cm EPR.   . Thoracic ascending aortic aneurysm ([facility])    4.2 cm ascending TAA 08/18/17 CTA. Annual imaging recommended.  . thyroid ca dx'd 2009 or 2010   surg and radioactive isoptope  . Ulcer    Peptic ulcer disease   Past Surgical History:  Past Surgical History:  Procedure Laterality Date  . ANTERIOR CERVICAL DECOMP/DISCECTOMY FUSION N/A 09/03/2017   Procedure: ACDF - C3-C4 - C4-C5 - C5-C6;  Surgeon: Kary Kos, MD;  Location: [facility];  Service: Neurosurgery;  Laterality: N/A;  . CAROTID ENDARTERECTOMY Left  January 03, 2006   Dr. Amedeo Plenty  . COLONOSCOPY    . EYE SURGERY Bilateral    cataract surgery with lens implants  . game keepers thumb Right   . JOINT REPLACEMENT Left    knee X 2  . ROTATOR CUFF REPAIR Right   . THYROIDECTOMY  2008   Social History:  reports that he quit smoking about 14 years ago. His smoking use included  cigars and cigarettes. He quit after 40.00 years of use. He has never used smokeless tobacco. He reports that he does not drink alcohol or use drugs.  Family / Support Systems Marital Status: Married How Long?: 59 years Patient Roles: Spouse, Parent Spouse/Significant Other: [name] (wife):[phone] Children: 2 adult children (son lives in [place]; dtr in [place]) Other Supports: N/A Anticipated Caregiver: Wife Ability/Limitations of Caregiver: Pt was independent Caregiver Availability: 24/7 Family Dynamics: Pt lives alone with his wife.  Social History Preferred language: English Religion: Presbyterian Cultural Background: Pt worked at [employer] for 32 years and retired in 1999; and than worked at [employer] for 7 years and retired in 2007. 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 [employer]) [place]: Yes Write: Yes Employment Status: Retired Date Retired/Disabled/Unemployed: 2007 Public relations account executive Issues: Denies Guardian/Conservator: N/A   Abuse/Neglect Abuse/Neglect Assessment Can Be Completed: Yes Physical Abuse: Denies Verbal Abuse: Denies Sexual Abuse: Denies Exploitation of patient/patient's resources: Denies Self-Neglect: Denies  Emotional Status Pt's affect, behavior and adjustment status: Pt adjusting to condition Recent Psychosocial Issues: Denies Psychiatric History: Denies Substance Abuse History: Denies; admits to quit smoking cigarettes in 1970/cigars in 1985  Patient / Family Perceptions, Expectations & Goals Pt/Family understanding of illness & functional limitations: Pt has general understandinf of medical condition Premorbid pt/family roles/activities: Independent Anticipated changes in roles/activities/participation: Assistance with ADLs/IADLs Pt/family expectations/goals: Pt goal is to work on standing and walking; being an independent person such as taking care of myself like bathing, shaving, etc.  [employer]: None Premorbid Home Care/DME Agencies: None Transportation available at discharge: Wife to transport  Discharge Planning Living Arrangements: Spouse/significant other [place]: Spouse/significant other Type of Residence: Private residence Insurance underwriter Resources: Multimedia programmer (specify)(UHC Medicare) Financial Resources: SSI, Other (Comment)(retirement/savings) Financial Screen Referred: No Living Expenses: Own Money Management: Patient, Spouse Does the patient have any problems obtaining your medications?: No [place] Barriers to Discharge: Decreased caregiver support, Lack of/limited family support Social Work Anticipated Follow Up Needs: HH/OP  Clinical Impression SW met with pt in room to introduce self, explain role, and discuss discharge process. Pt was in [employer]. No HCPOA. DME: 2 canes, 2 RW, rollator (given to him), w/c (purchased within last 2 yrs/[facility]), transport w/c, BSC, 2 slide boards, shower chair, and a ramped entrance.   Rana Snare 09/26/2019, 5:34 PM

## 2019-09-26 NOTE — H&P (Signed)
Physical Medicine and Rehabilitation Admission H&P    Chief Complaint  Patient presents with  . 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 Brett Fairy of neurology services as well as neurosurgery, myoclonic jerking, bowel bladder incontinence.  Per chart review patient lives with spouse.  1 level home with ramped entrance.  Needed assistance for dressing as well as transfers.  He has a PCA who does provide assistance.  Patient can ambulate short household distances with a walker.  Presented 09/17/2019 with generalized fatigue over the last couple of days and decrease in his functional mobility.  Cranial CT scan negative for acute changes.  Admission chemistries BUN 43, creatinine 1.64, WBC 13,000, troponin negative, urinalysis negative nitrite with urine culture 30,000 diphtheroids, blood cultures no growth to date.  MRI thoracic lumbar cervical spine showed chronic changes with patchy cord signal abnormality at the C3-4 level consistent with chronic myelomalacia.  Multilevel degenerative disc disease of the lower thoracic spine without high-grade spinal stenosis as well as lumbar spondylosis and congenitally short pedicles causing prominent impingement at L2-3 and 3-4 with moderate impingement L1-2 and 4-5 as well as T12-L1 and L5-S1.  Impingement of L1-2 and 2-3 minimally worsened from prior exam 07/24/2017.  Neurosurgery Dr. Kristeen Miss consulted in regards to MRI findings patient not felt to be a surgical  candidate advised IV Solu-Medrol then transition to prolonged prednisone with taper.  Echocardiogram completed during work-up shows ejection fraction of 60% no wall motion abnormalities.  Chronic pain control ongoing with the use of scheduled baclofen titrated to 15 mg 3 times daily as well as nightly Neurontin and OxyContin 10 mg every 12 hours.  Therapy evaluations completed and patient was admitted for a comprehensive rehab program.  Review of Systems  Constitutional: Positive for malaise/fatigue. Negative for chills and fever.  HENT: Negative for hearing loss.   Eyes: Negative for blurred vision and double vision.  Respiratory: Positive for shortness of breath. Negative for cough and wheezing.   Cardiovascular: Positive for leg swelling. Negative for chest pain and palpitations.  Gastrointestinal: Positive for constipation. Negative for heartburn, nausea and vomiting.       GERD as well as bowel incontinence  Genitourinary: Negative for dysuria and hematuria.       Chronic bladder incontinence  Musculoskeletal: Positive for back pain, joint pain, myalgias and neck pain.  Skin: Negative for rash.  Neurological: Positive for weakness and headaches.       Restless leg syndrome  Psychiatric/Behavioral: The patient has insomnia.   All other systems reviewed and are negative.  Past Medical History:  Diagnosis Date  . Arthritis   . Barrett esophagus   . Bronchitis   . Carotid artery occlusion   . COPD (chronic obstructive pulmonary disease) ([place])   . Diabetes mellitus without complication ([place])   . GERD (gastroesophageal reflux disease)   . Headache    migraines  . History of thyroid cancer 2008  . Hypertension   . Restless leg   . Sleep apnea with use of continuous positive airway pressure (CPAP)    2011  [facility] , [facility]  77cn central and obstrcutive. 16 cm water , 3 cm EPR.   . Thoracic ascending aortic aneurysm ([facility])    4.2 cm ascending TAA 08/18/17 CTA. Annual imaging  recommended.  . thyroid ca dx'd 2009 or 2010   surg and radioactive isoptope  . Ulcer    Peptic ulcer disease   Past Surgical History:  Procedure Laterality Date  . ANTERIOR CERVICAL DECOMP/DISCECTOMY FUSION N/A 09/03/2017   Procedure: ACDF - C3-C4 - C4-C5 - C5-C6;  Surgeon: Kary Kos, MD;  Location: [facility];  Service: Neurosurgery;  Laterality: N/A;  . CAROTID ENDARTERECTOMY Left January 03, 2006   Dr. Amedeo Plenty  . COLONOSCOPY    . EYE SURGERY Bilateral    cataract surgery with lens implants  . game keepers thumb Right   . JOINT REPLACEMENT Left    knee X 2  . ROTATOR CUFF REPAIR Right   . THYROIDECTOMY  2008   Family History  Problem Relation Age of Onset  . COPD Mother   . Hyperlipidemia Mother   . Hypertension Mother   . Diabetes Father   . Kidney disease Father        ESRD  . Hypertension Father   . Cancer Father   . Hyperlipidemia Father    Social History:  reports that he quit smoking about 14 years ago. His smoking use included cigars and cigarettes. He quit after 40.00 years of use. He has never used smokeless tobacco. He reports that he does not drink alcohol or use drugs. Allergies: No Known Allergies Medications Prior to Admission  Medication Sig Dispense Refill  . acetaminophen (TYLENOL) 325 MG tablet Take 2 tablets (650 mg total) by mouth every 4 (four) hours as needed for mild pain ((score 1 to 3) or temp > 100.5). 40 tablet 0  . allopurinol (ZYLOPRIM) 300 MG tablet Take 300 mg by mouth daily.   12  . baclofen (LIORESAL) 10 MG tablet TAKE 1 TABLET BY MOUTH 3 TIMES DAILY. (Patient taking differently: Take 10 mg by mouth 3 (three) times daily. ) 270 tablet 0  . calcium citrate-vitamin D (CITRACAL+D) 315-200 MG-UNIT per tablet Take 1 tablet by mouth daily. 750 mg     . carbidopa-levodopa (SINEMET IR) 10-100 MG tablet Take 1 tablet by mouth 2 (two) times daily.     . cholecalciferol (VITAMIN D) 1000 units tablet Take 1,000 Units by mouth daily.     . colchicine 0.6 MG  tablet Take 0.6 mg by mouth daily.    . furosemide (LASIX) 40 MG tablet Take 1 tablet (40 mg total) by mouth daily. 30 tablet 11  . gabapentin (NEURONTIN) 100 MG capsule Take 1 capsule (100 mg total) by mouth at bedtime. 30 capsule 1  . guaifenesin (HUMIBID E) 400 MG TABS tablet Take 400 mg by mouth daily.     [name] HYDROcodone-acetaminophen (NORCO/VICODIN) 5-325 MG tablet Take 1 tablet by mouth 3 (three) times daily as needed (pain). (Patient taking differently: Take 1 tablet by mouth daily as needed for moderate pain (pain). ) 30 tablet 0  . ipratropium-albuterol (DUONEB) 0.5-2.5 (3) MG/3ML SOLN Take 3 mLs by nebulization every 6 (six) hours as needed (cough).    . levETIRAcetam (KEPPRA) 250 MG tablet Take 250 mg by mouth 2 (two) times daily.    [name] levothyroxine (SYNTHROID) 175 MCG tablet Take 125 mcg by mouth daily before breakfast.     . metFORMIN (GLUCOPHAGE-XR) 500 MG 24 hr tablet Take 500 mg by  mouth 2 (two) times daily.     . metoprolol succinate (TOPROL-XL) 50 MG 24 hr tablet Take 1 tablet (50 mg total) by mouth daily. Take with or immediately following a meal. 90 tablet 3  . Multiple Vitamin (MULTIVITAMIN) tablet Take 1 tablet by mouth daily.    . nitroGLYCERIN (NITROSTAT) 0.4 MG SL tablet Place 0.4 mg every 5 (five) minutes as needed under the tongue for chest pain.    [name] oxymetazoline (AFRIN) 0.05 % nasal spray Place 2 sprays into the nose 2 (two) times daily as needed for congestion.     . pantoprazole (PROTONIX) 40 MG tablet Take 40 mg by mouth every evening.    . polyethylene glycol powder (MIRALAX) 17 GM/SCOOP powder Take by mouth as directed. 17 gm every other day     . rizatriptan (MAXALT) 10 MG tablet Take 10 mg by mouth as needed for migraine.     . simvastatin (ZOCOR) 40 MG tablet Take 40 mg by mouth daily at 6 PM.   0  . spironolactone (ALDACTONE) 25 MG tablet Take 25 mg by mouth daily.     . TRAVATAN Z 0.004 % SOLN ophthalmic solution Place 1 drop into both eyes at bedtime.     .  TRELEGY ELLIPTA 100-62.5-25 MCG/INH AEPB Inhale 1 puff into the lungs daily.   4  . triamcinolone cream (KENALOG) 0.5 % Apply 1 application topically daily as needed for rash.    . VENTOLIN HFA 108 (90 Base) MCG/ACT inhaler Inhale 1-2 puffs into the lungs every 4 (four) hours as needed for wheezing or shortness of breath.       Drug Regimen Review Drug regimen was reviewed and remains appropriate with no significant issues identified  Home: Home Living Family/patient expects to be discharged to:: Private residence Living Arrangements: Spouse/significant other Available Help at Discharge: Family, Available 24 hours/day, Personal care attendant Type of Home: House Home Access: Ramped entrance Home Layout: One level Bathroom Shower/Tub: Multimedia programmer: Handicapped height Bathroom Accessibility: Yes Home Equipment: Environmental consultant - 2 wheels, Wheelchair - manual, Bedside commode, Shower seat, Grab bars - tub/shower, Other (comment), Hand held shower head, Adaptive equipment Adaptive Equipment: Long-handled sponge Additional Comments: PCA assists with bed mobility and dressing (transfers)   Functional History: Prior Function Level of Independence: Needs assistance Gait / Transfers Assistance Needed: PCA assists with bed mobility, transfers (SPT) ADL's / Homemaking Assistance Needed: assist for dressing (including adult diaper changes), transfers for tub with shower chair, does not do IADL Comments: Pt in diaper all the time, PCA assists with transfers and dressing (knees down) but is set up for bathing at baseline (has AE)  Functional Status:  Mobility: Bed Mobility Overal bed mobility: Needs Assistance Bed Mobility: Supine to Sit Rolling: Min assist Sidelying to sit: Mod assist, HOB elevated Supine to sit: Mod assist, HOB elevated Sit to supine: Mod assist, +2 for physical assistance, +2 for safety/equipment General bed mobility comments: verbal cues for hand placement with  rolling and to avoid breath holding, mod assist to upright trunk and scoot to EOB Transfers Overall transfer level: Needs assistance Equipment used: Rolling walker (2 wheeled) Transfer via Lift Equipment: Stedy Transfers: Lateral/Scoot Transfers Sit to Stand: Mod assist, +2 physical assistance, +2 safety/equipment  Lateral/Scoot Transfers: Mod assist, From elevated surface General transfer comment: attempted STS transfers with RW, pt using rocking momentum and able to clear bottom off bed 1-2 inches 5 reps, but unable to stand fully upright due to weakness, cues to  push through bil flat feet; mod assist to scoot laterally from bed to bedside chair, cues for head-hip relationship and to push through BUE to assist in scooting hips Ambulation/Gait General Gait Details: not attempted    ADL: ADL Overall ADL's : Needs assistance/impaired Eating/Feeding: Set up, Sitting Grooming: Set up, Wash/dry hands, Wash/dry face, Sitting Grooming Details (indicate cue type and reason): EOB Upper Body Bathing: Moderate assistance, Sitting Upper Body Bathing Details (indicate cue type and reason): for back Lower Body Bathing: Moderate assistance Lower Body Bathing Details (indicate cue type and reason): for thoroughness of peri area, back, and buttocks due to soiled with urine (condom cath disconnected); pt able to reach peri area but limited Upper Body Dressing : Minimal assistance Upper Body Dressing Details (indicate cue type and reason): to don new gown  Lower Body Dressing: Maximal assistance Toilet Transfer: Moderate assistance Toilet Transfer Details (indicate cue type and reason): lateral scoot, simulated to recliner  Toileting- Clothing Manipulation and Hygiene: Total assistance Toileting - Clothing Manipulation Details (indicate cue type and reason): due to hygiene and condom cath coming off Functional mobility during ADLs: Moderate assistance General ADL Comments: decreased strength and  activity tolerance  Cognition: Cognition Overall Cognitive Status: Within Functional Limits for tasks assessed Orientation Level: Oriented X4 Cognition Arousal/Alertness: Awake/alert Behavior During Therapy: WFL for tasks assessed/performed Overall Cognitive Status: Within Functional Limits for tasks assessed  Physical Exam: Blood pressure (!) 168/85, pulse 60, temperature 98.2 F (36.8 C), temperature source Axillary, resp. rate 18, height 5\' 7"  (1.702 m), weight 100.9 kg, SpO2 97 %. Physical Exam  Constitutional: No distress.  HENT:  Head: Normocephalic and atraumatic.  Eyes: Pupils are equal, round, and reactive to light. EOM are normal.  Cardiovascular: Normal rate and regular rhythm. Exam reveals no friction rub.  No murmur heard. Respiratory: Effort normal. No respiratory distress. He has no wheezes. He has no rales.  GI: He exhibits no distension. There is no abdominal tenderness.  Musculoskeletal:     Cervical back: Normal range of motion.  Neurological:  Patient is alert sitting up in bed.  Makes good eye contact with examiner and oriented x3.  Skin: Skin is warm. He is not diaphoretic.  Psychiatric: He has a normal mood and affect. His behavior is normal.    Results for orders placed or performed during the hospital encounter of 09/17/19 (from the past 48 hour(s))  Glucose, capillary     Status: Abnormal   Collection Time: 09/24/19  7:23 AM  Result Value Ref Range   Glucose-Capillary 102 (H) 70 - 99 mg/dL    Comment: Glucose reference range applies only to samples taken after fasting for at least 8 hours.  Glucose, capillary     Status: Abnormal   Collection Time: 09/24/19 11:40 AM  Result Value Ref Range   Glucose-Capillary 108 (H) 70 - 99 mg/dL    Comment: Glucose reference range applies only to samples taken after fasting for at least 8 hours.  Glucose, capillary     Status: Abnormal   Collection Time: 09/24/19  4:15 PM  Result Value Ref Range    Glucose-Capillary 143 (H) 70 - 99 mg/dL    Comment: Glucose reference range applies only to samples taken after fasting for at least 8 hours.  Glucose, capillary     Status: Abnormal   Collection Time: 09/24/19  7:55 PM  Result Value Ref Range   Glucose-Capillary 164 (H) 70 - 99 mg/dL    Comment: Glucose reference range applies only  to samples taken after fasting for at least 8 hours.  Glucose, capillary     Status: Abnormal   Collection Time: 09/24/19 11:25 PM  Result Value Ref Range   Glucose-Capillary 121 (H) 70 - 99 mg/dL    Comment: Glucose reference range applies only to samples taken after fasting for at least 8 hours.  Glucose, capillary     Status: Abnormal   Collection Time: 09/25/19  3:37 AM  Result Value Ref Range   Glucose-Capillary 106 (H) 70 - 99 mg/dL    Comment: Glucose reference range applies only to samples taken after fasting for at least 8 hours.  Glucose, capillary     Status: Abnormal   Collection Time: 09/25/19  7:50 AM  Result Value Ref Range   Glucose-Capillary 103 (H) 70 - 99 mg/dL    Comment: Glucose reference range applies only to samples taken after fasting for at least 8 hours.  Glucose, capillary     Status: None   Collection Time: 09/25/19 12:04 PM  Result Value Ref Range   Glucose-Capillary 84 70 - 99 mg/dL    Comment: Glucose reference range applies only to samples taken after fasting for at least 8 hours.  Glucose, capillary     Status: Abnormal   Collection Time: 09/25/19  4:40 PM  Result Value Ref Range   Glucose-Capillary 100 (H) 70 - 99 mg/dL    Comment: Glucose reference range applies only to samples taken after fasting for at least 8 hours.  Glucose, capillary     Status: Abnormal   Collection Time: 09/25/19  8:04 PM  Result Value Ref Range   Glucose-Capillary 145 (H) 70 - 99 mg/dL    Comment: Glucose reference range applies only to samples taken after fasting for at least 8 hours.  Glucose, capillary     Status: Abnormal   Collection  Time: 09/25/19 11:56 PM  Result Value Ref Range   Glucose-Capillary 119 (H) 70 - 99 mg/dL    Comment: Glucose reference range applies only to samples taken after fasting for at least 8 hours.  Glucose, capillary     Status: Abnormal   Collection Time: 09/26/19  4:24 AM  Result Value Ref Range   Glucose-Capillary 128 (H) 70 - 99 mg/dL    Comment: Glucose reference range applies only to samples taken after fasting for at least 8 hours.   No results found.     Medical Problem List and Plan: 1.  Decreased functional ability with chronic left hemiparesis secondary to myelopathy of C-spine/radiculopathy status post ACDF 09/03/2017.  Received IV Solu-Medrol 60 mg 3 times daily x7 days and transitioned to p.o.  -patient may shower  -ELOS/Goals: 14-18 days, supervision to min assist with PT,OT 2.  Antithrombotics: -DVT/anticoagulation: Lovenox.  Check vascular study  -antiplatelet therapy: Aspirin 81 mg daily 3. Pain Management: Baclofen 15 mg 3 times daily, Neurontin 100 mg nightly, OxyContin 10 mg every 12 hours, hydrocodone as needed as well as Imitrex for headaches  -adjust regimen as needed based on efficacy 4. Mood: Provide emotional support  -antipsychotic agents: N/A 5. Neuropsych: This patient is capable of making decisions on his own behalf. 6. Skin/Wound Care: Routine skin checks 7. Fluids/Electrolytes/Nutrition: Routine in and outs with follow-up chemistries 8.  Chronic restless leg syndrome.  Sinemet 10-100 mg twice daily. 9.  Myoclonic jerking.  Continue chronic Keppra 250 mg twice daily 10.  OSA with CPAP/2 L chronic oxygen at home.  Continue inhalers 11.  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  Follow-up outpatient 12.  Diabetes mellitus.  Hemoglobin A1c 5.8.  SSI 13.  Neurogenic bowel and bladder.  Check PVR 14.  Hypothyroidism.  Synthroid 15.  Chronic diastolic congestive heart failure.  Monitor for any signs of fluid overload 16.  Hyperlipidemia.  Zocor  Cathlyn Parsons, PA-C 09/26/2019

## 2019-09-26 NOTE — Discharge Summary (Signed)
Physician Discharge Summary  [name] [id number] DOB: 17-Apr-1941 DOA: 09/17/2019  PCP: [name], MD  Admit date: 09/17/2019 Discharge date: 09/26/2019  Admitted From: Home Disposition: CIR  Recommendations for Outpatient Follow-up:  1. Follow up with PCP in 1-2 weeks 2. 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  Patient was admitted with generalized fatigue, severe, limiting mobility around the house.  Denied any fall or injury. Patient has chronic low back pain with radiation down the left leg that he does not think is significantly changed. Denied cough denies congestion denies fever. Denied loss of bowel or bladder denies loss peritoneal sensation. Has chronic numbness and weakness to his left leg that is unchanged. Denied recent head injury. At baseline (Prior to onset of current problems), patient reported being able to ambulate short distances with walker. Chronic LEFT hemiparesis. Chronic L LE spasm, stated approximately 2 weeks prior to presentation began to have increased L LE weakness with pain. On the day of presentation, patient woke and was unable to ambulate at all.Increased L LE pain/decreased sensation lateral aspect of L LE. States negative increased back pain. Negative increased stool/urine incontinence.  Admitted as above found to have acute on chronic  nerve root impingement of the lumbar spine discussed with Dr. Ellene Route neurosurgery recommending medical management given poor surgical candidate.  With IV steroids as recommended patient symptoms drastically improved if not resolved.  Unfortunately patient's ambulatory dysfunction is ongoing, so much so that physical therapy is recommending ongoing therapy at inpatient rehab.  At this time patient is now medically stable and otherwise agreeable for discharge to CIR for ongoing physical therapy and evaluation.  Patient has newly diagnosed thoracic ascending aortic aneurysm per imaging here, patient was unaware of this diagnosis, 2019 reported at 4.2 cm, will need yearly evaluation and follow-up with PCP, remains asymptomatic, blood pressure well controlled.  Patient's chronic's are stable, no medication changes except outlined as below with steroid taper and new pain medication.  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 [facility])   Essential hypertension   PVD (peripheral vascular disease) (HCC)   Chronic diastolic CHF (congestive heart failure) (HCC)   Diabetes mellitus type 2, controlled, with complications ([place])   Obese   Thoracic aortic aneurysm (HCC)   Bowel incontinence   Bladder incontinence   Acute renal failure (ARF) (HCC)   Left hemiparesis ([place])   Chronic respiratory failure with hypoxia ([place])   Syncope and collapse    Discharge Instructions  Discharge Instructions    Call MD for:  difficulty breathing, headache or visual disturbances   Complete by: As directed    Call MD for:  extreme fatigue   Complete by: As directed    Call MD for:  hives  Complete by: As directed     Call MD for:  persistant dizziness or light-headedness   Complete by: As directed    Call MD for:  persistant nausea and vomiting   Complete by: As directed    Call MD for:  severe uncontrolled pain   Complete by: As directed    Call MD for:  temperature >100.4   Complete by: As directed    Diet - low sodium heart healthy   Complete by: As directed    Increase activity slowly   Complete by: As directed      Allergies as of 09/26/2019   No Known Allergies     Medication List    STOP taking these medications   HYDROcodone-acetaminophen 5-325 MG tablet Commonly known as: NORCO/VICODIN     TAKE these medications   acetaminophen 325 MG tablet Commonly known as: TYLENOL Take 2 tablets (650 mg total) by mouth every 4 (four) hours as needed for mild pain ((score 1 to 3) or temp > 100.5).   allopurinol 300 MG tablet Commonly known as: ZYLOPRIM Take 300 mg by mouth daily.   baclofen 10 MG tablet Commonly known as: LIORESAL TAKE 1 TABLET BY MOUTH 3 TIMES DAILY. What changed:   how much to take  how to take this  when to take this  additional instructions   calcium citrate-vitamin D 315-200 MG-UNIT tablet Commonly known as: CITRACAL+D Take 1 tablet by mouth daily. 750 mg   carbidopa-levodopa 10-100 MG tablet Commonly known as: SINEMET IR Take 1 tablet by mouth 2 (two) times daily.   cholecalciferol 25 MCG (1000 UNIT) tablet Commonly known as: VITAMIN D Take 1,000 Units by mouth daily.   colchicine 0.6 MG tablet Take 0.6 mg by mouth daily.   furosemide 40 MG tablet Commonly known as: LASIX Take 1 tablet (40 mg total) by mouth daily.   gabapentin 100 MG capsule Commonly known as: NEURONTIN Take 1 capsule (100 mg total) by mouth at bedtime.   guaifenesin 400 MG Tabs tablet Commonly known as: HUMIBID E Take 400 mg by mouth daily.   ipratropium-albuterol 0.5-2.5 (3) MG/3ML Soln Commonly known as: DUONEB Take 3 mLs by nebulization every 6 (six) hours as needed  (cough).   levETIRAcetam 250 MG tablet Commonly known as: KEPPRA Take 250 mg by mouth 2 (two) times daily.   levothyroxine 175 MCG tablet Commonly known as: SYNTHROID Take 125 mcg by mouth daily before breakfast.   metFORMIN 500 MG 24 hr tablet Commonly known as: GLUCOPHAGE-XR Take 500 mg by mouth 2 (two) times daily.   metoprolol succinate 50 MG 24 hr tablet Commonly known as: TOPROL-XL Take 1 tablet (50 mg total) by mouth daily. Take with or immediately following a meal.   MiraLax 17 GM/SCOOP powder Generic drug: polyethylene glycol powder Take by mouth as directed. 17 gm every other day   multivitamin tablet Take 1 tablet by mouth daily.   nitroGLYCERIN 0.4 MG SL tablet Commonly known as: NITROSTAT Place 0.4 mg every 5 (five) minutes as needed under the tongue for chest pain.   oxyCODONE 10 mg 12 hr tablet Commonly known as: OXYCONTIN Take 1 tablet (10 mg total) by mouth every 12 (twelve) hours for 5 days.   oxymetazoline 0.05 % nasal spray Commonly known as: AFRIN Place 2 sprays into the nose 2 (two) times daily as needed for congestion.   pantoprazole 40 MG tablet Commonly known as: PROTONIX Take 40 mg by mouth every evening.   predniSONE 10  MG tablet Commonly known as: DELTASONE Take 4 tablets (40 mg total) by mouth daily for 3 days, THEN 3 tablets (30 mg total) daily for 3 days, THEN 2 tablets (20 mg total) daily for 3 days, THEN 1 tablet (10 mg total) daily for 3 days. Start taking on: September 26, 2019   rizatriptan 10 MG tablet Commonly known as: MAXALT Take 10 mg by mouth as needed for migraine.   simvastatin 40 MG tablet Commonly known as: ZOCOR Take 40 mg by mouth daily at 6 PM.   spironolactone 25 MG tablet Commonly known as: ALDACTONE Take 25 mg by mouth daily.   Travatan Z 0.004 % Soln ophthalmic solution Generic drug: Travoprost (BAK Free) Place 1 drop into both eyes at bedtime.   Trelegy Ellipta 100-62.5-25 MCG/INH Aepb Generic drug:  Fluticasone-Umeclidin-Vilant Inhale 1 puff into the lungs daily.   triamcinolone cream 0.5 % Commonly known as: KENALOG Apply 1 application topically daily as needed for rash.   Ventolin HFA 108 (90 Base) MCG/ACT inhaler Generic drug: albuterol Inhale 1-2 puffs into the lungs every 4 (four) hours as needed for wheezing or shortness of breath.       No Known Allergies  Consultations:  Neurosurgery, Dr. Ellene Route   Procedures/Studies: CT Head Wo Contrast  Result Date: 09/17/2019 CLINICAL DATA:  Altered mental status EXAM: CT HEAD WITHOUT CONTRAST TECHNIQUE: Contiguous axial images were obtained from the base of the skull through the vertex without intravenous contrast. COMPARISON:  08/23/2017 FINDINGS: Brain: No evidence of acute infarction, hemorrhage, hydrocephalus, extra-axial collection or mass lesion/mass effect. Scattered low-density changes within the periventricular and subcortical white matter compatible with chronic microvascular ischemic change. Mild diffuse cerebral volume loss. Vascular: Atherosclerotic calcifications involving the large vessels of the skull base. No unexpected hyperdense vessel. Skull: Normal. Negative for fracture or focal lesion. Sinuses/Orbits: No acute finding. Other: None. IMPRESSION: 1.  No acute intracranial findings. 2.  Chronic microvascular ischemic change and cerebral volume loss. Electronically Signed   By: Davina Poke D.O.   On: 09/17/2019 13:52   MR THORACIC SPINE WO CONTRAST  Result Date: 09/17/2019 CLINICAL DATA:  Mid back pain EXAM: MRI THORACIC SPINE WITHOUT CONTRAST TECHNIQUE: Multiplanar, multisequence MR imaging of the thoracic spine was performed. No intravenous contrast was administered. COMPARISON:  None. FINDINGS: The examination is degraded by motion. Axial gradient echo images were not obtained. Alignment: Physiologic. Vertebrae: No fracture, evidence of discitis, or bone lesion. Cord: There is an area of hyperintense T2-weighted  signal within the lower thoracic spinal cord. The spinal cord is otherwise poorly evaluated due to motion. Paraspinal and other soft tissues: Small left pleural effusion. Disc levels: There is degenerative disc disease at multiple lower thoracic levels without high-grade spinal canal stenosis. IMPRESSION: 1. Limited examination due to motion artifact and lack of IV contrast. 2. There is an area of hyperintense T2-weighted signal within the lower thoracic spinal cord. This is incompletely evaluated due to motion artifact and lack of IV contrast. Given the limitations of the study, a repeat examination with sedation or anesthesia might be helpful. 3. 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 Electronically Signed   By: Ulyses Jarred M.D.   On: 09/17/2019 22:35   MR Cervical Spine W or Wo Contrast  Result Date: 09/17/2019 CLINICAL DATA:  Initial evaluation for generalized weakness, inability to stand without assistance. EXAM: MRI CERVICAL SPINE WITHOUT AND WITH CONTRAST TECHNIQUE: Multiplanar and multiecho pulse sequences of the cervical spine,  to include the craniocervical junction and cervicothoracic junction, were obtained without and with intravenous contrast. CONTRAST:  27mL GADAVIST GADOBUTROL 1 MMOL/ML IV SOLN COMPARISON:  Prior MRI from 08/23/2017. FINDINGS: Alignment: Examination moderately degraded by motion artifact, somewhat limiting assessment. Straightening of the normal cervical lordosis. Trace 2 mm retrolisthesis of C2 on C3, with 2-3 mm anterolisthesis of C6 on C7 and C7 on T1. Findings chronic and facet mediated. Vertebrae: Susceptibility artifact from prior ACDF at C3-C6. Vertebral body height maintained without evidence for acute or chronic fracture. Bone marrow signal intensity within normal limits. No visible discrete or worrisome osseous lesions on the on this motion degraded exam. No abnormal marrow edema or enhancement. Cord:  Probable patchy cord signal abnormality within the cervical spinal cord at the level of C3-4, consistent with chronic myelomalacia related to previously seen compressive stenosis. No other definite cord signal changes on this motion degraded exam. No abnormal enhancement. Posterior Fossa, vertebral arteries, paraspinal tissues: Visualized brain and posterior fossa within normal limits. Retro cerebellar cyst versus mega cisterna magna noted. Craniocervical junction within normal limits. Paraspinous and prevertebral soft tissues unremarkable. Normal intravascular flow voids seen within the vertebral arteries bilaterally. Disc levels: C2-C3: Retrolisthesis. Mild diffuse disc bulge. Right greater than left facet hypertrophy. No significant spinal stenosis or cord deformity. Moderate left with mild right C3 foraminal stenosis. C3-C4: Prior fusion. Residual posterior endplate osseous ridging indents the ventral thecal sac with persistent mild to moderate spinal stenosis. Bilateral facet hypertrophy. Persistent moderate to severe right worse than left C4 foraminal narrowing. Appearance is improved relative to previous exam. C4-C5: Prior fusion. Residual posterior endplate osseous ridging indents the ventral thecal sac with persistent mild spinal stenosis. Moderate bilateral facet hypertrophy. Persistent moderate to severe bilateral foraminal narrowing. Appearance is slightly improved from previous exam. C5-C6: Prior fusion. Residual central endplate osseous ridging indents the ventral thecal sac, contacting and mildly flattening the cervical spinal cord. Persistent mild to moderate spinal stenosis, improved from previous. Bilateral facet hypertrophy with right greater than left uncinate spurring with resultant moderate to severe right worse than left C6 foraminal narrowing, also improved. C6-C7: Anterolisthesis. Mild disc bulge with bilateral uncovertebral hypertrophy. Flattening of the ventral thecal sac with no more than  mild spinal stenosis. Superimposed bilateral facet hypertrophy. Moderate bilateral C7 foraminal narrowing, grossly stable. C7-T1: Anterolisthesis. Negative interspace. Bilateral facet hypertrophy. No significant spinal stenosis. Foramina remain patent. T1-2: Diffuse disc bulge. Posterior element hypertrophy. Resultant mild spinal stenosis with moderate left worse than right foraminal narrowing, grossly similar. IMPRESSION: 1. Technically limited exam due to extensive motion artifact. 2. Postoperative changes from prior ACDF at C3-C6. Residual endplate osseous ridging with uncovertebral and facet hypertrophy at these levels, with persistent mild to moderate diffuse spinal stenosis with moderate to severe bilateral foraminal narrowing as above. Overall, appearance is improved relative to preoperative exam from 2019. 3. Patchy cord signal abnormality at the level of C3-4, consistent with chronic myelomalacia. Electronically Signed   By: Jeannine Boga M.D.   On: 09/17/2019 15:59   MR Lumbar Spine W Wo Contrast  Result Date: 09/18/2019 CLINICAL DATA:  Weakness and difficulty standing. Low back pain. EXAM: MRI LUMBAR SPINE WITHOUT AND WITH CONTRAST TECHNIQUE: Multiplanar and multiecho pulse sequences of the lumbar spine were obtained without and with intravenous contrast. CONTRAST:  16mL GADAVIST GADOBUTROL 1 MMOL/ML IV SOLN COMPARISON:  07/24/2017 FINDINGS: Despite efforts by the technologist and patient, marked motion artifact is present on today's exam and could not be eliminated. This reduces exam sensitivity  and specificity. Segmentation: The lowest lumbar type non-rib-bearing vertebra is labeled as L5. Alignment: 4 mm degenerative retrolisthesis at L2-3 and L3-4 as on prior exams. Vertebrae: Type 2 degenerative endplate findings at X33443 and L4-5 with type 1 degenerative endplate findings at X33443. Disc desiccation and loss of disc height is present at all lumbar levels with scattered Schmorl's nodes.  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 Conus medullaris and cauda equina: Conus extends to the L1 level. There is no expansion of the conus. Questionable mild accentuated central signal in the distal thoracic cord, as suggested on prior thoracic spine MRI, although assessment is again limited by motion artifact. No appreciable abnormal enhancement of the cauda equina to suggest arachnoiditis. Paraspinal and other soft tissues: Bilateral renal fluid signal intensity lesions favor cysts. Disc levels: T12-L1: Mild displacement of the right T12 nerve in the lateral extraforaminal space due to right lateral extraforaminal disc protrusion. Right lateral recess disc protrusion is also noted. Roughly stable from prior. L1-2: Moderate central narrowing of the thecal sac due to disc bulge and facet arthropathy. AP diameter of the thecal sac is 0.7 cm. Minimally worsened from prior. L2-3: Prominent central narrowing of the thecal sac with borderline left foraminal stenosis and likely mild bilateral subarticular lateral recess stenosis due to disc bulge, intervertebral spurring, and facet arthropathy. AP diameter of the thecal sac is 0.5 cm. Minimally worsened from prior. L3-4: Prominent central narrowing of the thecal sac with moderate to prominent right and mild left foraminal stenosis and right greater than left subarticular lateral recess stenosis due to intervertebral spurring, disc bulge, and facet arthropathy. AP diameter the thecal sac is 0.6 cm. Essentially stable from prior. L4-5: Moderate right and mild left foraminal stenosis with moderate central narrowing of the thecal sac due to right greater than left facet arthropathy, intervertebral spurring, and disc bulge. Appearance is stable from prior. L5-S1: Mild right and borderline left foraminal stenosis due to intervertebral and facet spurring along with mild disc bulge. Appearance is stable from prior. IMPRESSION: 1. Lumbar spondylosis,  degenerative disc disease, and congenitally short pedicles causing prominent impingement at L2-3 and L3-4; moderate impingement at L1-2 and L4-5; and mild impingement at T12-L1 and L5-S1, as detailed above. Impingement at L1-2 and L2-3 is minimally worsened from the prior exam of 07/24/2017. 2. Questionable accentuated central T2 signal in the distal thoracic cord, as suggested on prior thoracic spine MRI, although assessment is limited by motion artifact. Electronically Signed   By: Van Clines M.D.   On: 09/18/2019 12:00   DG Chest Port 1 View  Result Date: 09/17/2019 CLINICAL DATA:  Generalized weakness. EXAM: PORTABLE CHEST 1 VIEW COMPARISON:  CT 12/07/2018 FINDINGS: Chronic cardiomegaly and aortic tortuosity. The right chest is clear. Left hemidiaphragm is elevated, versus the possible presence of a sub pulmonic effusion. There is volume loss at the left lung base. IMPRESSION: Elevated left hemidiaphragm with volume loss at the left lung base. Cannot rule a sub pulmonic effusion. Cardiomegaly and aortic atherosclerosis. Electronically Signed   By: Nelson Chimes M.D.   On: 09/17/2019 11:54   ECHOCARDIOGRAM COMPLETE  Result Date: 09/18/2019    ECHOCARDIOGRAM REPORT   Patient Name:   [name] [facility] Date of Exam: 09/18/2019 Medical Rec #:  [id number]    Height:       67.0 in Accession #:    [id number]   Weight:       205.0 lb Date of Birth:  [birth year]  BSA:          2.044 m Patient Age:    16 years     BP:           153/86 mmHg Patient Gender: M            HR:           69 bpm. Exam Location:  Inpatient Procedure: 2D Echo, Cardiac Doppler and Color Doppler Indications:    R55 Syncope  History:        Patient has prior history of Echocardiogram examinations, most                 recent 01/19/2019. PAD and COPD; Risk Factors:Hypertension,                 Diabetes, Sleep Apnea and GERD. Thoracic Aortic Aneurysm.  Sonographer:    Jonelle Sidle Dance Referring Phys: [place]:[id number] [place]  1. Normal LV  systolic function; mild LVH; mild LAE; mildly dilated aortic root; mild AI.  2. Left ventricular ejection fraction, by estimation, is 55 to 60%. The left ventricle has normal function. The left ventricle has no regional wall motion abnormalities. There is mild left ventricular hypertrophy. Left ventricular diastolic parameters were normal.  3. Right ventricular systolic function is normal. The right ventricular size is normal.  4. Left atrial size was mildly dilated.  5. The mitral valve is normal in structure. No evidence of mitral valve regurgitation. No evidence of mitral stenosis.  6. The aortic valve is tricuspid. Aortic valve regurgitation is mild. Mild to moderate aortic valve sclerosis/calcification is present, without any evidence of aortic stenosis.  7. Aortic dilatation noted. There is mild dilatation of the aortic root measuring 40 mm.  8. The inferior vena cava is normal in size with greater than 50% respiratory variability, suggesting right atrial pressure of 3 mmHg. FINDINGS  Left Ventricle: Left ventricular ejection fraction, by estimation, is 55 to 60%. The left ventricle has normal function. The left ventricle has no regional wall motion abnormalities. The left ventricular internal cavity size was normal in size. There is  mild left ventricular hypertrophy. Left ventricular diastolic parameters were normal. Right Ventricle: The right ventricular size is normal. Right ventricular systolic function is normal. Left Atrium: Left atrial size was mildly dilated. Right Atrium: Right atrial size was normal in size. Pericardium: There is no evidence of pericardial effusion. Mitral Valve: The mitral valve is normal in structure. Normal mobility of the mitral valve leaflets. Moderate mitral annular calcification. No evidence of mitral valve regurgitation. No evidence of mitral valve stenosis. Tricuspid Valve: The tricuspid valve is normal in structure. Tricuspid valve regurgitation is trivial. No evidence of  tricuspid stenosis. Aortic Valve: The aortic valve is tricuspid. Aortic valve regurgitation is mild. Aortic regurgitation PHT measures 427 msec. Mild to moderate aortic valve sclerosis/calcification is present, without any evidence of aortic stenosis. Pulmonic Valve: The pulmonic valve was not well visualized. Pulmonic valve regurgitation is not visualized. No evidence of pulmonic stenosis. Aorta: Aortic dilatation noted. There is mild dilatation of the aortic root measuring 40 mm. Venous: The inferior vena cava is normal in size with greater than 50% respiratory variability, suggesting right atrial pressure of 3 mmHg.  Additional Comments: Normal LV systolic function; mild LVH; mild LAE; mildly dilated aortic root; mild AI.  LEFT VENTRICLE PLAX 2D LVIDd:         5.20 cm  Diastology LVIDs:         4.10 cm  LV e' lateral:   10.30 cm/s LV PW:         1.40 cm  LV E/e' lateral: 9.7 LV IVS:        1.00 cm  LV e' medial:    8.49 cm/s LVOT diam:     2.30 cm  LV E/e' medial:  11.7 LV SV:         89 LV SV Index:   44 LVOT Area:     4.15 cm  RIGHT VENTRICLE             IVC RV Basal diam:  2.90 cm     IVC diam: 1.30 cm RV S prime:     12.70 cm/s TAPSE (M-mode): 2.2 cm LEFT ATRIUM             Index       RIGHT ATRIUM           Index LA diam:        4.30 cm 2.10 cm/m  RA Area:     15.90 cm LA Vol (A2C):   86.6 ml 42.38 ml/m RA Volume:   33.10 ml  16.20 ml/m LA Vol (A4C):   62.9 ml 30.78 ml/m LA Biplane Vol: 76.6 ml 37.48 ml/m  AORTIC VALVE LVOT Vmax:   119.00 cm/s LVOT Vmean:  71.000 cm/s LVOT VTI:    0.214 m AI PHT:      427 msec  AORTA Ao Root diam: 4.10 cm Ao Asc diam:  3.80 cm MITRAL VALVE MV Area (PHT): 4.39 cm    SHUNTS MV Decel Time: 173 msec    Systemic VTI:  0.21 m MV E velocity: 99.40 cm/s  Systemic Diam: 2.30 cm MV A velocity: 94.70 cm/s MV E/A ratio:  1.05 Kirk Ruths MD Electronically signed by Kirk Ruths MD Signature Date/Time: 09/18/2019/12:58:27 PM    Final      Subjective: No acute issues or  events overnight, pain markedly well controlled, otherwise excited for transition to inpatient rehab given his improvement.  Denies shortness of breath, chest pain, nausea, vomiting, diarrhea, constipation, headache, fevers, chills.   Discharge Exam: Vitals:   09/25/19 2123 09/26/19 0420  BP: (!) 166/79 (!) 168/85  Pulse: 76 60  Resp: 16 18  Temp: 98.3 F (36.8 C) 98.2 F (36.8 C)  SpO2: 94% 97%   Vitals:   09/25/19 1440 09/25/19 1918 09/25/19 2123 09/26/19 0420  BP:   (!) 166/79 (!) 168/85  Pulse:  68 76 60  Resp: 18 16 16 18   Temp:   98.3 F (36.8 C) 98.2 F (36.8 C)  TempSrc:   Oral Axillary  SpO2:  95% 94% 97%  Weight:    100.9 kg  Height:        General:  Pleasantly resting in bed, No acute distress. HEENT:  Normocephalic atraumatic.  Sclerae nonicteric, noninjected.  Extraocular movements intact bilaterally. Neck:  Without mass or deformity.  Trachea is midline. Lungs:  Clear to auscultate bilaterally without rhonchi, wheeze, or rales. Heart:  Regular rate and rhythm.  Without murmurs, rubs, or gallops. Abdomen:  Soft, nontender, nondistended.  Without guarding or rebound. Extremities: Without cyanosis, clubbing, edema, or obvious deformity. Vascular:  Dorsalis pedis and posterior tibial pulses palpable bilaterally. Skin:  Warm and dry, no erythema, no ulcerations.   The results of significant diagnostics from this hospitalization (including imaging, microbiology, ancillary and laboratory) are listed below for reference.     Microbiology: Recent Results (from the past 240 hour(s))  Culture,  Urine     Status: Abnormal   Collection Time: 09/17/19  1:05 PM   Specimen: Urine, Clean Catch  Result Value Ref Range Status   Specimen Description   Final    URINE, CLEAN CATCH Performed at [facility], [place] [address], [place], [place] [address]    Special Requests   Final    NONE Performed at [facility], [place] [address],  [place], [place] [address]    Culture (A)  Final    30,000 COLONIES/mL DIPHTHEROIDS(CORYNEBACTERIUM SPECIES) Standardized susceptibility testing for this organism is not available. Performed at [facility], [place] [address] [place], [place] [address]    Report Status 09/18/2019 FINAL  Final  SARS CORONAVIRUS 2 (TAT 6-24 HRS) Nasopharyngeal Nasopharyngeal Swab     Status: None   Collection Time: 09/17/19  5:49 PM   Specimen: Nasopharyngeal Swab  Result Value Ref Range Status   SARS Coronavirus 2 NEGATIVE NEGATIVE Final    Comment: (NOTE) SARS-CoV-2 target nucleic acids are NOT DETECTED. The SARS-CoV-2 RNA is generally detectable in upper and lower respiratory specimens during the acute phase of infection. Negative results do not preclude SARS-CoV-2 infection, do not rule out co-infections with other pathogens, and should not be used as the sole basis for treatment or other patient management decisions. Negative results must be combined with clinical observations, patient history, and epidemiological information. The expected result is Negative. Fact Sheet for Patients: SugarRoll.be Fact Sheet for Healthcare Providers: https://www.woods-mathews.com/ This test is not yet approved or cleared by the [place] FDA and  has been authorized for detection and/or diagnosis of SARS-CoV-2 by FDA under an Emergency Use Authorization (EUA). This EUA will remain  in effect (meaning this test can be used) for the duration of the COVID-19 declaration under Section 56 4(b)(1) of the Act, 21 U.S.C. section 360bbb-3(b)(1), unless the authorization is terminated or revoked sooner. Performed at [facility], [place] [address], [place], [place] [address]   Culture, blood (routine x 2)     Status: None   Collection Time: 09/17/19  6:55 PM   Specimen: BLOOD RIGHT FOREARM  Result Value Ref Range Status   Specimen Description   Final    BLOOD RIGHT  FOREARM Performed at [place] [address], [place], [place] [address]    Special Requests   Final    BOTTLES DRAWN AEROBIC ONLY Blood Culture results may not be optimal due to an inadequate volume of blood received in culture bottles Performed at [place] [address], [place], [place] [address]    Culture   Final    NO GROWTH 5 DAYS Performed at [facility], [place] [address] [place], [place] [address]    Report Status 09/22/2019 FINAL  Final  Culture, blood (routine x 2)     Status: None   Collection Time: 09/17/19  6:55 PM   Specimen: BLOOD LEFT FOREARM  Result Value Ref Range Status   Specimen Description   Final    BLOOD LEFT FOREARM Performed at [place] [address], [place], [place] [address]    Special Requests   Final    BOTTLES DRAWN AEROBIC AND ANAEROBIC Blood Culture adequate volume Performed at [place] [address] [place], [place] [address]    Culture   Final    NO GROWTH 5 DAYS Performed at [facility], [place] [address] [place], [place]  [id number]    Report Status 09/22/2019 FINAL  Final     Labs: BNP (last 3 results) No results for input(s): BNP in the last 8760 hours. Basic Metabolic Panel: Recent Labs  Lab 09/20/19 0533 09/21/19 0532 09/22/19 0532 09/24/19 0522  NA 141 141 140  --   K 4.6 4.4 4.4  --   CL 106 106 106  --   CO2 26 27 26   --   GLUCOSE 140* 125* 124*  --   BUN 31* 28* 28*  --   CREATININE 0.92 0.90 0.80 0.71  CALCIUM 8.5* 8.1* 7.5*  --   MG 2.3 2.4 2.6*  --   PHOS 3.0 2.1* 2.4*  --    Liver Function Tests: Recent Labs  Lab 09/20/19 0533 09/21/19 0532 09/22/19 0532  AST 15 17 25   ALT 9 13 19   ALKPHOS 55 52 49  BILITOT 0.4 0.8 0.5  PROT 6.4* 6.3* 6.0*  ALBUMIN 3.5 3.4* 3.2*   No results for input(s): LIPASE, AMYLASE in the last 168 hours. No results for input(s): AMMONIA in the last 168  hours. CBC: Recent Labs  Lab 09/20/19 0533 09/21/19 0532 09/22/19 0532 09/23/19 0519  WBC 11.6* 13.5* 10.3 9.6  NEUTROABS 10.2* 11.7* 8.5* 8.1*  HGB 12.3* 12.5* 12.2* 12.9*  HCT 38.1* 38.5* 38.1* 40.0  MCV 92.0 92.8 92.7 91.7  PLT 231 217 209 205   Cardiac Enzymes: No results for input(s): CKTOTAL, CKMB, CKMBINDEX, TROPONINI in the last 168 hours. BNP: Invalid input(s): POCBNP CBG: Recent Labs  Lab 09/25/19 1640 09/25/19 2004 09/25/19 2356 09/26/19 0424 09/26/19 0723  GLUCAP 100* 145* 119* 128* 93   D-Dimer No results for input(s): DDIMER in the last 72 hours. Hgb A1c No results for input(s): HGBA1C in the last 72 hours. Lipid Profile No results for input(s): CHOL, HDL, LDLCALC, TRIG, CHOLHDL, LDLDIRECT in the last 72 hours. Thyroid function studies No results for input(s): TSH, T4TOTAL, T3FREE, THYROIDAB in the last 72 hours.  Invalid input(s): FREET3 Anemia work up No results for input(s): VITAMINB12, FOLATE, FERRITIN, TIBC, IRON, RETICCTPCT in the last 72 hours. Urinalysis    Component Value Date/Time   COLORURINE STRAW (A) 09/17/2019 1305   APPEARANCEUR CLEAR 09/17/2019 1305   LABSPEC 1.005 09/17/2019 1305   PHURINE 6.0 09/17/2019 1305   GLUCOSEU NEGATIVE 09/17/2019 1305   HGBUR NEGATIVE 09/17/2019 1305   BILIRUBINUR NEGATIVE 09/17/2019 1305   KETONESUR NEGATIVE 09/17/2019 1305   PROTEINUR NEGATIVE 09/17/2019 1305   UROBILINOGEN 1.0 09/09/2010 1045   NITRITE NEGATIVE 09/17/2019 1305   LEUKOCYTESUR LARGE (A) 09/17/2019 1305   Sepsis Labs Invalid input(s): PROCALCITONIN,  WBC,  LACTICIDVEN Microbiology Recent Results (from the past 240 hour(s))  Culture, Urine     Status: Abnormal   Collection Time: 09/17/19  1:05 PM   Specimen: Urine, Clean Catch  Result Value Ref Range Status   Specimen Description   Final    URINE, CLEAN CATCH Performed at [facility], [place] [address], [place], [place] [address]    Special Requests   Final     NONE Performed at [facility], [place] [address], [place], [place] [address]    Culture (A)  Final    30,000 COLONIES/mL DIPHTHEROIDS(CORYNEBACTERIUM SPECIES) Standardized susceptibility testing for this organism is not available. Performed at [facility], [place] [address], [place], [place] [address]    Report Status 09/18/2019 FINAL  Final  SARS CORONAVIRUS 2 (TAT 6-24 HRS) Nasopharyngeal Nasopharyngeal Swab  Status: None   Collection Time: 09/17/19  5:49 PM   Specimen: Nasopharyngeal Swab  Result Value Ref Range Status   SARS Coronavirus 2 NEGATIVE NEGATIVE Final    Comment: (NOTE) SARS-CoV-2 target nucleic acids are NOT DETECTED. The SARS-CoV-2 RNA is generally detectable in upper and lower respiratory specimens during the acute phase of infection. Negative results do not preclude SARS-CoV-2 infection, do not rule out co-infections with other pathogens, and should not be used as the sole basis for treatment or other patient management decisions. Negative results must be combined with clinical observations, patient history, and epidemiological information. The expected result is Negative. Fact Sheet for Patients: SugarRoll.be Fact Sheet for Healthcare Providers: https://www.woods-mathews.com/ This test is not yet approved or cleared by the [place] FDA and  has been authorized for detection and/or diagnosis of SARS-CoV-2 by FDA under an Emergency Use Authorization (EUA). This EUA will remain  in effect (meaning this test can be used) for the duration of the COVID-19 declaration under Section 56 4(b)(1) of the Act, 21 U.S.C. section 360bbb-3(b)(1), unless the authorization is terminated or revoked sooner. Performed at [facility], [place] [address], [place], [place] [address]   Culture, blood (routine x 2)     Status: None   Collection Time: 09/17/19  6:55 PM   Specimen: BLOOD RIGHT FOREARM  Result  Value Ref Range Status   Specimen Description   Final    BLOOD RIGHT FOREARM Performed at [place] [address] [place], [place] [address]    Special Requests   Final    BOTTLES DRAWN AEROBIC ONLY Blood Culture results may not be optimal due to an inadequate volume of blood received in culture bottles Performed at [facility] [facility] [address] [place], [place] [address]    Culture   Final    NO GROWTH 5 DAYS Performed at [facility], [place] [address], [place], [place] [address]    Report Status 09/22/2019 FINAL  Final  Culture, blood (routine x 2)     Status: None   Collection Time: 09/17/19  6:55 PM   Specimen: BLOOD LEFT FOREARM  Result Value Ref Range Status   Specimen Description   Final    BLOOD LEFT FOREARM Performed at [place] [address] [place], [place] [address]    Special Requests   Final    BOTTLES DRAWN AEROBIC AND ANAEROBIC Blood Culture adequate volume Performed at [facility] [place] [address], [place], [place] [address]    Culture   Final    NO GROWTH 5 DAYS Performed at [facility], [place] [address] [place], [place] [address]    Report Status 09/22/2019 FINAL  Final    Time coordinating discharge: Over 30 minutes  SIGNED:  Little Ishikawa, DO [facility] 09/26/2019, 11:41 AM Pager   If 7PM-7AM, please contact night-coverage [email or web address]

## 2019-09-26 NOTE — Progress Notes (Signed)
Patient being transported [facility] to rehab.

## 2019-09-26 NOTE — Progress Notes (Signed)
Patient ID: [name], male   DOB: [birth year], 79 y.o.   MRN: [id number] Patient was admitted to 4W07 via stretcher, escorted by [facility] staff.  Patient verbalized understanding of rehab process including fall prevention policy, personal belongings policy, and visitation policy.  Patient appears to be in no immediate distress at this time.  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 2:  Abrasion to left knee 3: skin tear to left forearm 4: laceration to the base of right hand first finger.  Dr. Naaman Plummer at bedside doing evaluation.    Brita Romp, RN

## 2019-09-26 NOTE — H&P (Addendum)
Physical Medicine and Rehabilitation Admission H&P        Chief Complaint  Patient presents with  . 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 Brett Fairy of neurology services as well as neurosurgery, myoclonic jerking, bowel bladder incontinence.  Per chart review patient lives with spouse.  1 level home with ramped entrance.  Needed assistance for dressing as well as transfers.  He has a PCA who does provide assistance.  Patient can ambulate short household distances with a walker.  Presented 09/17/2019 with generalized fatigue over the last couple of days and decrease in his functional mobility.  Cranial CT scan negative for acute changes.  Admission chemistries BUN 43, creatinine 1.64, WBC 13,000, troponin negative, urinalysis negative nitrite with urine culture 30,000 diphtheroids, blood cultures no growth to date.  MRI thoracic lumbar cervical spine showed chronic changes with patchy cord signal abnormality at the C3-4 level consistent with chronic myelomalacia.  Multilevel degenerative disc disease of the lower thoracic spine without high-grade spinal stenosis as well as lumbar spondylosis and congenitally short pedicles causing prominent impingement at L2-3 and 3-4 with moderate impingement L1-2 and 4-5 as well as T12-L1 and L5-S1.  Impingement of L1-2 and 2-3 minimally worsened from prior exam 07/24/2017.  Neurosurgery Dr. Kristeen Miss consulted in regards to MRI findings patient not felt to be a  surgical candidate advised IV Solu-Medrol then transition to prolonged prednisone with taper.  Echocardiogram completed during work-up shows ejection fraction of 60% no wall motion abnormalities.  Chronic pain control ongoing with the use of scheduled baclofen titrated to 15 mg 3 times daily as well as nightly Neurontin and OxyContin 10 mg every 12 hours.  Therapy evaluations completed and patient was admitted for a comprehensive rehab program.   Review of Systems  Constitutional: Positive for malaise/fatigue. Negative for chills and fever.  HENT: Negative for hearing loss.   Eyes: Negative for blurred vision and double vision.  Respiratory: Positive for shortness of breath. Negative for cough and wheezing.   Cardiovascular: Positive for leg swelling. Negative for chest pain and palpitations.  Gastrointestinal: Positive for constipation. Negative for heartburn, nausea and vomiting.       GERD as well as bowel incontinence  Genitourinary: Negative for dysuria and hematuria.       Chronic bladder incontinence  Musculoskeletal: Positive for back pain, joint pain, myalgias and neck pain.  Skin: Negative for rash.  Neurological: Positive for weakness and headaches.       Restless leg syndrome  Psychiatric/Behavioral: The patient has insomnia.   All other systems reviewed and are negative.       Past Medical History:  Diagnosis Date  . Arthritis    . Barrett esophagus    . Bronchitis    . Carotid artery occlusion    . COPD (chronic obstructive pulmonary disease) ([place])    . Diabetes mellitus without complication ([place])    . GERD (gastroesophageal reflux disease)    . Headache      migraines  . History of thyroid cancer 2008  . Hypertension    .  Restless leg    . Sleep apnea with use of continuous positive airway pressure (CPAP)      2011 [facility] , [facility]  77cn central and obstrcutive. 16 cm water , 3 cm EPR.   . Thoracic ascending aortic aneurysm ([facility])      4.2 cm ascending TAA  08/18/17 CTA. Annual imaging recommended.  . thyroid ca dx'd 2009 or 2010    surg and radioactive isoptope  . Ulcer      Peptic ulcer disease         Past Surgical History:  Procedure Laterality Date  . ANTERIOR CERVICAL DECOMP/DISCECTOMY FUSION N/A 09/03/2017    Procedure: ACDF - C3-C4 - C4-C5 - C5-C6;  Surgeon: Kary Kos, MD;  Location: [facility];  Service: Neurosurgery;  Laterality: N/A;  . CAROTID ENDARTERECTOMY Left January 03, 2006    Dr. Amedeo Plenty  . COLONOSCOPY      . EYE SURGERY Bilateral      cataract surgery with lens implants  . game keepers thumb Right    . JOINT REPLACEMENT Left      knee X 2  . ROTATOR CUFF REPAIR Right    . THYROIDECTOMY   2008         Family History  Problem Relation Age of Onset  . COPD Mother    . Hyperlipidemia Mother    . Hypertension Mother    . Diabetes Father    . Kidney disease Father          ESRD  . Hypertension Father    . Cancer Father    . Hyperlipidemia Father      Social History:  reports that he quit smoking about 14 years ago. His smoking use included cigars and cigarettes. He quit after 40.00 years of use. He has never used smokeless tobacco. He reports that he does not drink alcohol or use drugs. Allergies: No Known Allergies       Medications Prior to Admission  Medication Sig Dispense Refill  . acetaminophen (TYLENOL) 325 MG tablet Take 2 tablets (650 mg total) by mouth every 4 (four) hours as needed for mild pain ((score 1 to 3) or temp > 100.5). 40 tablet 0  . allopurinol (ZYLOPRIM) 300 MG tablet Take 300 mg by mouth daily.    12  . baclofen (LIORESAL) 10 MG tablet TAKE 1 TABLET BY MOUTH 3 TIMES DAILY. (Patient taking differently: Take 10 mg by mouth 3 (three) times daily. ) 270 tablet 0  . calcium citrate-vitamin D (CITRACAL+D) 315-200 MG-UNIT per tablet Take 1 tablet by mouth daily. 750 mg       . carbidopa-levodopa (SINEMET IR) 10-100 MG tablet Take 1 tablet by mouth 2 (two) times daily.       . cholecalciferol (VITAMIN D)  1000 units tablet Take 1,000 Units by mouth daily.       . colchicine 0.6 MG tablet Take 0.6 mg by mouth daily.      . furosemide (LASIX) 40 MG tablet Take 1 tablet (40 mg total) by mouth daily. 30 tablet 11  . gabapentin (NEURONTIN) 100 MG capsule Take 1 capsule (100 mg total) by mouth at bedtime. 30 capsule 1  . guaifenesin (HUMIBID E) 400 MG TABS tablet Take 400 mg by mouth daily.       [name] HYDROcodone-acetaminophen (NORCO/VICODIN) 5-325 MG tablet Take 1 tablet by mouth 3 (three) times daily as needed (pain). (Patient taking differently: Take 1 tablet by mouth daily as needed for moderate  pain (pain). ) 30 tablet 0  . ipratropium-albuterol (DUONEB) 0.5-2.5 (3) MG/3ML SOLN Take 3 mLs by nebulization every 6 (six) hours as needed (cough).      . levETIRAcetam (KEPPRA) 250 MG tablet Take 250 mg by mouth 2 (two) times daily.      [name] levothyroxine (SYNTHROID) 175 MCG tablet Take 125 mcg by mouth daily before breakfast.       . metFORMIN (GLUCOPHAGE-XR) 500 MG 24 hr tablet Take 500 mg by mouth 2 (two) times daily.       . metoprolol succinate (TOPROL-XL) 50 MG 24 hr tablet Take 1 tablet (50 mg total) by mouth daily. Take with or immediately following a meal. 90 tablet 3  . Multiple Vitamin (MULTIVITAMIN) tablet Take 1 tablet by mouth daily.      . nitroGLYCERIN (NITROSTAT) 0.4 MG SL tablet Place 0.4 mg every 5 (five) minutes as needed under the tongue for chest pain.      [name] oxymetazoline (AFRIN) 0.05 % nasal spray Place 2 sprays into the nose 2 (two) times daily as needed for congestion.       . pantoprazole (PROTONIX) 40 MG tablet Take 40 mg by mouth every evening.      . polyethylene glycol powder (MIRALAX) 17 GM/SCOOP powder Take by mouth as directed. 17 gm every other day       . rizatriptan (MAXALT) 10 MG tablet Take 10 mg by mouth as needed for migraine.       . simvastatin (ZOCOR) 40 MG tablet Take 40 mg by mouth daily at 6 PM.    0  . spironolactone (ALDACTONE) 25 MG tablet Take 25 mg by mouth  daily.       . TRAVATAN Z 0.004 % SOLN ophthalmic solution Place 1 drop into both eyes at bedtime.       . TRELEGY ELLIPTA 100-62.5-25 MCG/INH AEPB Inhale 1 puff into the lungs daily.    4  . triamcinolone cream (KENALOG) 0.5 % Apply 1 application topically daily as needed for rash.      . VENTOLIN HFA 108 (90 Base) MCG/ACT inhaler Inhale 1-2 puffs into the lungs every 4 (four) hours as needed for wheezing or shortness of breath.           Drug Regimen Review Drug regimen was reviewed and remains appropriate with no significant issues identified   Home: Home Living Family/patient expects to be discharged to:: Private residence Living Arrangements: Spouse/significant other Available Help at Discharge: Family, Available 24 hours/day, Personal care attendant Type of Home: House Home Access: Ramped entrance Home Layout: One level Bathroom Shower/Tub: Multimedia programmer: Handicapped height Bathroom Accessibility: Yes Home Equipment: Environmental consultant - 2 wheels, Wheelchair - manual, Bedside commode, Shower seat, Grab bars - tub/shower, Other (comment), Hand held shower head, Adaptive equipment Adaptive Equipment: Long-handled sponge Additional Comments: PCA assists with bed mobility and dressing (transfers)   Functional History: Prior Function Level of Independence: Needs assistance Gait / Transfers Assistance Needed: PCA assists with bed mobility, transfers (SPT) ADL's / Homemaking Assistance Needed: assist for dressing (including adult diaper changes), transfers for tub with shower chair, does not do IADL Comments: Pt in diaper all the time, PCA assists with transfers and dressing (knees down) but is set up for bathing at baseline (has AE)   Functional Status:  Mobility: Bed Mobility Overal bed mobility: Needs Assistance Bed Mobility: Supine to Sit Rolling: Min assist Sidelying to sit: Mod assist, HOB elevated Supine to sit: Mod assist, HOB  elevated Sit to supine: Mod assist, +2  for physical assistance, +2 for safety/equipment General bed mobility comments: verbal cues for hand placement with rolling and to avoid breath holding, mod assist to upright trunk and scoot to EOB Transfers Overall transfer level: Needs assistance Equipment used: Rolling walker (2 wheeled) Transfer via Lift Equipment: Stedy Transfers: Lateral/Scoot Transfers Sit to Stand: Mod assist, +2 physical assistance, +2 safety/equipment  Lateral/Scoot Transfers: Mod assist, From elevated surface General transfer comment: attempted STS transfers with RW, pt using rocking momentum and able to clear bottom off bed 1-2 inches 5 reps, but unable to stand fully upright due to weakness, cues to push through bil flat feet; mod assist to scoot laterally from bed to bedside chair, cues for head-hip relationship and to push through BUE to assist in scooting hips Ambulation/Gait General Gait Details: not attempted   ADL: ADL Overall ADL's : Needs assistance/impaired Eating/Feeding: Set up, Sitting Grooming: Set up, Wash/dry hands, Wash/dry face, Sitting Grooming Details (indicate cue type and reason): EOB Upper Body Bathing: Moderate assistance, Sitting Upper Body Bathing Details (indicate cue type and reason): for back Lower Body Bathing: Moderate assistance Lower Body Bathing Details (indicate cue type and reason): for thoroughness of peri area, back, and buttocks due to soiled with urine (condom cath disconnected); pt able to reach peri area but limited Upper Body Dressing : Minimal assistance Upper Body Dressing Details (indicate cue type and reason): to don new gown  Lower Body Dressing: Maximal assistance Toilet Transfer: Moderate assistance Toilet Transfer Details (indicate cue type and reason): lateral scoot, simulated to recliner  Toileting- Clothing Manipulation and Hygiene: Total assistance Toileting - Clothing Manipulation Details (indicate cue type and reason): due to hygiene and condom cath  coming off Functional mobility during ADLs: Moderate assistance General ADL Comments: decreased strength and activity tolerance   Cognition: Cognition Overall Cognitive Status: Within Functional Limits for tasks assessed Orientation Level: Oriented X4 Cognition Arousal/Alertness: Awake/alert Behavior During Therapy: WFL for tasks assessed/performed Overall Cognitive Status: Within Functional Limits for tasks assessed   Physical Exam: Blood pressure (!) 168/85, pulse 60, temperature 98.2 F (36.8 C), temperature source Axillary, resp. rate 18, height 5\' 7"  (1.702 m), weight 100.9 kg, SpO2 97 %. Physical Exam  Constitutional: No distress.  HENT:  Head: Normocephalic and atraumatic.  Eyes: Pupils are equal, round, and reactive to light. EOM are normal.  Cardiovascular: Normal rate and regular rhythm. Exam reveals no friction rub.  No murmur heard. Respiratory: Effort normal. No respiratory distress. He has no wheezes. He has no rales.  GI: He exhibits no distension. There is no abdominal tenderness.  Musculoskeletal:     Cervical back: Normal range of motion.  Neurological:  Patient is alert sitting up in bed.  Makes good eye contact with examiner and oriented x3. CN exam intact. UE grossly 5/5 with trace to 1+ DTR's. RLE 4/5 prox to distal with pain inhibition. LLE: 2+ to 3+ prox to distal with more pain inhibition. Sensation to LT/temperature decreased in LLE up to nearly umbilicus. DTR's in LE surprisingly brisk at 2+ patella/achilles.  Skin: laceration right index finger, abrasions healing left knee, stage 3 left lateral malleolus with central fibronecrotic tissue. Buttocks slightly red, moist, no breakdown.  Psychiatric: He has a normal mood and affect. His behavior is normal.      Lab Results Last 48 Hours        Results for orders placed or performed during the hospital encounter of 09/17/19 (from the past 48 hour(s))  Glucose, capillary     Status: Abnormal    Collection  Time: 09/24/19  7:23 AM  Result Value Ref Range    Glucose-Capillary 102 (H) 70 - 99 mg/dL      Comment: Glucose reference range applies only to samples taken after fasting for at least 8 hours.  Glucose, capillary     Status: Abnormal    Collection Time: 09/24/19 11:40 AM  Result Value Ref Range    Glucose-Capillary 108 (H) 70 - 99 mg/dL      Comment: Glucose reference range applies only to samples taken after fasting for at least 8 hours.  Glucose, capillary     Status: Abnormal    Collection Time: 09/24/19  4:15 PM  Result Value Ref Range    Glucose-Capillary 143 (H) 70 - 99 mg/dL      Comment: Glucose reference range applies only to samples taken after fasting for at least 8 hours.  Glucose, capillary     Status: Abnormal    Collection Time: 09/24/19  7:55 PM  Result Value Ref Range    Glucose-Capillary 164 (H) 70 - 99 mg/dL      Comment: Glucose reference range applies only to samples taken after fasting for at least 8 hours.  Glucose, capillary     Status: Abnormal    Collection Time: 09/24/19 11:25 PM  Result Value Ref Range    Glucose-Capillary 121 (H) 70 - 99 mg/dL      Comment: Glucose reference range applies only to samples taken after fasting for at least 8 hours.  Glucose, capillary     Status: Abnormal    Collection Time: 09/25/19  3:37 AM  Result Value Ref Range    Glucose-Capillary 106 (H) 70 - 99 mg/dL      Comment: Glucose reference range applies only to samples taken after fasting for at least 8 hours.  Glucose, capillary     Status: Abnormal    Collection Time: 09/25/19  7:50 AM  Result Value Ref Range    Glucose-Capillary 103 (H) 70 - 99 mg/dL      Comment: Glucose reference range applies only to samples taken after fasting for at least 8 hours.  Glucose, capillary     Status: None    Collection Time: 09/25/19 12:04 PM  Result Value Ref Range    Glucose-Capillary 84 70 - 99 mg/dL      Comment: Glucose reference range applies only to samples taken after  fasting for at least 8 hours.  Glucose, capillary     Status: Abnormal    Collection Time: 09/25/19  4:40 PM  Result Value Ref Range    Glucose-Capillary 100 (H) 70 - 99 mg/dL      Comment: Glucose reference range applies only to samples taken after fasting for at least 8 hours.  Glucose, capillary     Status: Abnormal    Collection Time: 09/25/19  8:04 PM  Result Value Ref Range    Glucose-Capillary 145 (H) 70 - 99 mg/dL      Comment: Glucose reference range applies only to samples taken after fasting for at least 8 hours.  Glucose, capillary     Status: Abnormal    Collection Time: 09/25/19 11:56 PM  Result Value Ref Range    Glucose-Capillary 119 (H) 70 - 99 mg/dL      Comment: Glucose reference range applies only to samples taken after fasting for at least 8 hours.  Glucose, capillary     Status: Abnormal  Collection Time: 09/26/19  4:24 AM  Result Value Ref Range    Glucose-Capillary 128 (H) 70 - 99 mg/dL      Comment: Glucose reference range applies only to samples taken after fasting for at least 8 hours.      Imaging Results (Last 48 hours)  No results found.           Medical Problem List and Plan: 1.  Decreased functional ability due to lumbar stenosis/radiculopathy. MRI's also suggest (albeit with motion artifact) distal thoracic involvement as well.  Exam is more c/w this too.   -He also has chronic left hemiparesis secondary to myelopathy of C-spine/radiculopathy status post ACDF 09/03/2017 in addition to his low back problems  - Received IV Solu-Medrol 60 mg 3 times daily x7 days and transitioned to p.o.  -consider re-imaging of thoracic spine (perhaps with sedation) to better look at thoracic cord.  -admitting patient to inpatient rehab             -patient may shower             -ELOS/Goals: 14-18 days, supervision to min assist with PT,OT 2.  Antithrombotics: -DVT/anticoagulation: Lovenox.  Check vascular study             -antiplatelet therapy: Aspirin 81 mg  daily 3. Pain Management: Baclofen 15 mg 3 times daily, Neurontin 100 mg nightly, OxyContin 10 mg every 12 hours, hydrocodone as needed as well as Imitrex for headaches             -adjust regimen as needed based on efficacy, wean as possible  -oral steroids to taper 4. Mood: Provide emotional support             -antipsychotic agents: N/A 5. Neuropsych: This patient is capable of making decisions on his own behalf. 6. Skin/Wound Care: place steristrips over right index finger lac.    -begin wet to dry to chronic left lateral malleolus wound (stage III)   -foam dressing left knee 7. Fluids/Electrolytes/Nutrition: Routine in and outs with follow-up chemistries 8.  Chronic restless leg syndrome.  Sinemet 10-100 mg twice daily. 9.  Myoclonic jerking.  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  OSA with CPAP/2 L chronic oxygen at home.  Continue inhalers 11.  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  Follow-up outpatient 12.  Diabetes mellitus.  Hemoglobin A1c 5.8.  SSI 13.  Neurogenic bowel and bladder.  Check PVR 14.  Hypothyroidism.  Synthroid 15.  Chronic diastolic congestive heart failure.  Monitor for any signs of fluid overload. Check regular weights 16.  Hyperlipidemia.  Zocor   Lavon Paganini Angiulli, PA-C 09/26/2019  I have personally performed a face to face diagnostic evaluation of this patient and formulated the key components of the plan.  Additionally, I have personally reviewed laboratory data, imaging studies, as well as relevant notes and concur with the physician assistant's documentation above.  The patient's status has not changed from the original H&P.  Any changes in documentation from the acute care chart have been noted above.  [name], MD, [facility]

## 2019-09-26 NOTE — Progress Notes (Signed)
Inpatient Rehabilitation-Admissions Coordinator   I have received medical clearance from Dr. Avon Gully for admit to CIR today. I have notified the patient of bed offer and he and his wife are on board to admit today. I have reviewed insurance benefits letter and consent form with the patient and he is in agreement. RN and [facility] team aware of plan for today.   I have scheduled transport with [facility] with anticipated arrival time of noon.   Please call if questions.   RN can call for report [phone].   Raechel Ache, OTR/L  Rehab Admissions Coordinator  [phone] 09/26/2019 10:57 AM

## 2019-09-26 NOTE — Progress Notes (Signed)
[name], MD  Physician  Physical Medicine and Rehabilitation  PMR Pre-admission     Signed  Date of Service:  09/23/2019  5:17 PM      Related encounter: ED to Hosp-Admission (Discharged) from 09/17/2019 in [place]      Signed        PMR Admission Coordinator Pre-Admission Assessment   Patient: [name] is an 79 y.o., male MRN: [id number] DOB: 1941/05/11 Height: '5\' 7"'  (170.2 cm) Weight: 99.2 kg   Insurance Information HMO:     PPO: yes     PCP:      IPA:      80/20:      OTHER:  PRIMARY: U[employer] Medicare      Policy#: [id number]      Subscriber: patient  CM Name: Estill Bamberg (customer service, and confirmed by Titus Mould provider relations manager)      Phone#: [phone]     Fax#: [phone] Pre-Cert#: [id number]      Employer:  [employer] provided by Estill Bamberg through phone [phone] for admit to CIR. Auth #: [id number]. Navi #: [id number]. Pt is approved for 7 days with initial start of care date 4/7 and next review date 4/13. Clinicals are to be faxed to (f): [phone] Benefits:  Phone #: online     Name: [email or web address] Eff. Date: 06/20/19-06/18/20     Deduct: does not have ($0)      Out of Pocket Max: $3,900 ($361.20 met)      Life Max: NA CIR: $345/day co-pay for days 1-5, $0/day co-pay for days 6+      SNF: $0/day copay for days 1-20, $184/day copay for days 21-42, $0/day copay for days 43-100; limited to 100 days/cal yr.  Outpatient: $35/visit co-pay; limited by medical necessity    Home Health: 100% coverage, 0% co-insurance; limited by medical necessity      DME: 80% coverage, 20% co-insurance  Providers:  SECONDARY: None      Policy#:       Subscriber:  CM Name:       Phone#:      Fax#:  Pre-Cert#:       Employer:  Benefits:  Phone #:      Name Eff. Date:     Deduct:       Out of Pocket Max:       Life Max:  CIR:      SNF:  Outpatient:      Co-Pay:  Home Health:       Co-Pay: DME:      Co-Pay:    Medicaid  Application Date:       Case Manager:  Disability Application Date:       Case Worker:    The "Data Collection Information Summary" for patients in Inpatient Rehabilitation Facilities with attached "Privacy Act [place] Records" was provided and verbally reviewed with: Patient   Emergency Contact Information         Contact Information     Name Relation Home Work Mobile    Stettler,Susan Spouse [phone]   [phone]    Alvino Blood Daughter [phone]        Kayvion, [place]on [phone]             Current Medical History  Patient Admitting Diagnosis: Debility and decreased ability to complete ADLs and ambulation due to acute on chronic weakness, nerve root impingement in L spine and cervical radiculopathy   History of Present Illness: Pt is  a 79 yo male with history of chronic diastolic CHF, PVD, essential HTN, thoracic ascending aortic aneurysm, OSA on CPAP/central sleep apnea, chronic respiratory failure on 2L O2 at home, DM type II with complication, 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 Pt was admitted to [facility] on 09/17/19 with complains of generalized fatigue that had been present for a few days and that had affected his ability to ambulate. At baseline pt was able to ambulate short distances with a walker but approximately 2 prior to admission began having increased LLE weakness and pain. Pt progressed to the point of not being able to ambulate at all. MRI of his thoracic and lumbar spine showed significant chronic damage and acute changes. On 4/2 pt was started on high-dose steriods with pt noting improvement in strength and muscle spasms. Dr. Ellene Route with neurosurgery reviewed pt's case and he was felt to be an extremely poor candidate for surgery and medical management was encouraged. Pt completed IV  solu-medrol course and transitioned to prolonged prednisone taper. Pt has been evaluated by therapies with recommendation for CIR as pt has had a large decline from his baseline. Pt is to admit to CIR on 09/26/19.    Patient's medical record from [facility] has been reviewed by the rehabilitation admission coordinator and physician.   Past Medical History      Past Medical History:  Diagnosis Date  . Arthritis    . Barrett esophagus    . Bronchitis    . Carotid artery occlusion    . COPD (chronic obstructive pulmonary disease) ([place])    . Diabetes mellitus without complication ([place])    . GERD (gastroesophageal reflux disease)    . Headache      migraines  . History of thyroid cancer 2008  . Hypertension    . Restless leg    . Sleep apnea with use of continuous positive airway pressure (CPAP)      2011 [facility] , AHI  77cn central and obstrcutive. 16 cm water , 3 cm EPR.   . Thoracic ascending aortic aneurysm (HCC)      4.2 cm ascending TAA 08/18/17 CTA. Annual imaging recommended.  . thyroid ca dx'd 2009 or 2010    surg and radioactive isoptope  . Ulcer      Peptic ulcer disease      Family History   family history includes COPD in his mother; Cancer in his father; Diabetes in his father; Hyperlipidemia in his father and mother; Hypertension in his father and mother; Kidney disease in his father.   Prior Rehab/Hospitalizations Has the patient had prior rehab or hospitalizations prior to admission? Yes   Has the patient had major surgery during 100 days prior to admission? No               Current Medications   Current Facility-Administered Medications:  .  0.9 %  sodium chloride infusion, , Intravenous, Continuous, Allie Bossier, MD, Last Rate: 50 mL/hr at 09/23/19 1310, New Bag at 09/23/19 1310 .  albuterol (PROVENTIL) (2.5 MG/3ML) 0.083% nebulizer solution 2.5 mg, 2.5 mg, Inhalation, Q4H PRN, Allie Bossier, MD .  allopurinol (ZYLOPRIM) tablet 300 mg, 300  mg, Oral, Daily, Allie Bossier, MD, 300 mg at 09/23/19 0854 .  aspirin EC tablet 81 mg, 81 mg, Oral, Daily, Allie Bossier, MD, 81 mg at 09/23/19 0854 .  baclofen (LIORESAL) tablet 15 mg, 15 mg, Oral, TID, Allie Bossier, MD, 15 mg at 09/23/19 1709 .  carbidopa-levodopa (SINEMET IR) 10-100 MG per tablet immediate release 1 tablet, 1 tablet, Oral, BID, Allie Bossier, MD, 1 tablet at 09/23/19 [phone] .  colchicine tablet 0.6 mg, 0.6 mg, Oral, Daily, Allie Bossier, MD, 0.6 mg at 09/23/19 0853 .  enoxaparin (LOVENOX) injection 40 mg, 40 mg, Subcutaneous, Daily, Allie Bossier, MD, 40 mg at 09/23/19 0854 .  fluticasone furoate-vilanterol (BREO ELLIPTA) 200-25 MCG/INH 1 puff, 1 puff, Inhalation, Daily, Lenis Noon, RPH, 1 puff at 09/23/19 1038 .  gabapentin (NEURONTIN) capsule 100 mg, 100 mg, Oral, QHS, Allie Bossier, MD, 100 mg at 09/22/19 2109 .  HYDROcodone-acetaminophen (NORCO/VICODIN) 5-325 MG per tablet 1 tablet, 1 tablet, Oral, TID PRN, Allie Bossier, MD, 1 tablet at 09/22/19 1735 .  insulin aspart (novoLOG) injection 0-20 Units, 0-20 Units, Subcutaneous, Q4H, Allie Bossier, MD, 3 Units at 09/23/19 1709 .  ipratropium-albuterol (DUONEB) 0.5-2.5 (3) MG/3ML nebulizer solution 3 mL, 3 mL, Nebulization, Q6H PRN, Allie Bossier, MD .  latanoprost (XALATAN) 0.005 % ophthalmic solution 1 drop, 1 drop, Both Eyes, QHS, Allie Bossier, MD, 1 drop at 09/22/19 2113 .  levETIRAcetam (KEPPRA) tablet 250 mg, 250 mg, Oral, BID, Allie Bossier, MD, 250 mg at 09/23/19 0853 .  levothyroxine (SYNTHROID) tablet 125 mcg, 125 mcg, Oral, QAC breakfast, Allie Bossier, MD, 125 mcg at 09/23/19 [phone] .  LORazepam (ATIVAN) injection 2 mg, 2 mg, Intravenous, Once, Lang Snow, FNP .  methylPREDNISolone sodium succinate (SOLU-MEDROL) 125 mg/2 mL injection 60 mg, 60 mg, Intravenous, Q8H, Little Ishikawa, MD, 60 mg at 09/23/19 1209 .  morphine 2 MG/ML injection 2 mg, 2 mg, Intravenous, Q4H PRN, Allie Bossier, MD, 2 mg at 09/21/19 1237 .  morphine 4 MG/ML injection 4 mg, 4 mg, Intravenous, Once, Deno Etienne, DO, Stopped at 09/17/19 1751 .  multivitamin with minerals tablet 1 tablet, 1 tablet, Oral, Daily, Allie Bossier, MD, 1 tablet at 09/23/19 0854 .  nitroGLYCERIN (NITROSTAT) SL tablet 0.4 mg, 0.4 mg, Sublingual, Q5 min PRN, Allie Bossier, MD .  ondansetron [facility]) injection 4 mg, 4 mg, Intravenous, Once, Deno Etienne, DO, Stopped at 09/17/19 1751 .  oxyCODONE (OXYCONTIN) 12 hr tablet 10 mg, 10 mg, Oral, Q12H, Allie Bossier, MD, 10 mg at 09/23/19 0854 .  pantoprazole (PROTONIX) EC tablet 40 mg, 40 mg, Oral, QPM, Allie Bossier, MD, 40 mg at 09/23/19 1709 .  polyethylene glycol (MIRALAX / GLYCOLAX) packet 17 g, 17 g, Oral, BID, Little Ishikawa, MD, 17 g at 09/23/19 1209 .  simvastatin (ZOCOR) tablet 40 mg, 40 mg, Oral, q1800, Allie Bossier, MD, 40 mg at 09/23/19 1709 .  sodium chloride (OCEAN) 0.65 % nasal spray 1 spray, 1 spray, Each Nare, PRN, Dana Allan I, MD .  umeclidinium bromide (INCRUSE ELLIPTA) 62.5 MCG/INH 1 puff, 1 puff, Inhalation, Daily, Lenis Noon, RPH, 1 puff at 09/23/19 1036   Patients Current Diet:     Diet Order                      Diet Heart Room service appropriate? Yes; Fluid consistency: Thin  Diet effective now                   Precautions / Restrictions Precautions Precautions: Fall Restrictions Weight Bearing Restrictions: No  Has the patient had 2 or more falls or a fall with injury in the past year? Yes   Prior Activity Level Household: not working or driving PTA and Supervision/Min A for ADLs and ambulation.    Prior Functional Level Self Care: Did the patient need help bathing, dressing, using the toilet or eating? Needed some help   Indoor Mobility: Did the patient need assistance with walking from room to room (with or without device)? Independent   Stairs: Did the patient need assistance with internal or external  stairs (with or without device)? Dependent   Functional Cognition: Did the patient need help planning regular tasks such as shopping or remembering to take medications? Needed some help   Home Assistive Devices / [place] Devices/Equipment: CPAP, Dentures (specify type), Eyeglasses, Grab bars around toilet, Grab bars in shower, [employer], [employer] chair without back, Wheelchair, Environmental consultant (specify type), Other (Comment), Nebulizer(walk-in shower, front wheeled walker, upper denture) Home Equipment: Environmental consultant - 2 wheels, Wheelchair - manual, Bedside commode, Shower seat, Grab bars - tub/shower, Other (comment), Hand held shower head, Adaptive equipment   Prior Device Use: Indicate devices/aids used by the patient prior to current illness, exacerbation or injury? RW in house; wc at times    Current Functional Level Cognition   Overall Cognitive Status: Within Functional Limits for tasks assessed Orientation Level: Oriented X4    Extremity Assessment (includes Sensation/Coordination)   Upper Extremity Assessment: Generalized weakness  Lower Extremity Assessment: Generalized weakness, RLE deficits/detail, LLE deficits/detail RLE Deficits / Details: Pt reports intermittent spasms  LLE Deficits / Details: Intermittent spasms noted - pt states bandage is on knee 2* injury caused by spasm     ADLs   Overall ADL's : Needs assistance/impaired Eating/Feeding: Set up, Sitting Grooming: Set up, Wash/dry hands, Wash/dry face, Sitting Grooming Details (indicate cue type and reason): EOB Upper Body Bathing: Moderate assistance, Sitting Upper Body Bathing Details (indicate cue type and reason): for back Lower Body Bathing: Moderate assistance Lower Body Bathing Details (indicate cue type and reason): for thoroughness of peri area, back, and buttocks due to soiled with urine (condom cath disconnected); pt able to reach peri area but limited Upper Body Dressing : Minimal assistance Upper Body  Dressing Details (indicate cue type and reason): to don new gown  Lower Body Dressing: Maximal assistance Toilet Transfer: Moderate assistance Toilet Transfer Details (indicate cue type and reason): lateral scoot, simulated to recliner  Toileting- Clothing Manipulation and Hygiene: Total assistance Toileting - Clothing Manipulation Details (indicate cue type and reason): due to hygiene and condom cath coming off Functional mobility during ADLs: Moderate assistance General ADL Comments: decreased strength and activity tolerance     Mobility   Overal bed mobility: Needs Assistance Bed Mobility: Rolling, Sidelying to Sit Rolling: Min assist Sidelying to sit: Mod assist, HOB elevated Supine to sit: Mod assist, HOB elevated Sit to supine: Mod assist, +2 for physical assistance, +2 for safety/equipment General bed mobility comments: min assist to roll with cueing for technique during hygiene, transitioned to EOB with min support for LBs and mod support for trunk given increased time and effort     Transfers   Overall transfer level: Needs assistance Equipment used: Rolling walker (2 wheeled) Transfer via Lift Equipment: Stedy Transfers: Lateral/Scoot Transfers Sit to Stand: Mod assist, +2 physical assistance, +2 safety/equipment  Lateral/Scoot Transfers: Mod assist, From elevated surface General transfer comment: mod assist to transition from elevated EOB to drop arm recliner towards R side with increased cueing for  technique and weight shifting      Ambulation / Gait / Stairs / Wheelchair Mobility   Ambulation/Gait General Gait Details: not attempted     Posture / Balance Dynamic Sitting Balance Sitting balance - Comments: min guard to close supervision  Balance Overall balance assessment: Needs assistance Sitting-balance support: No upper extremity supported, Feet supported Sitting balance-Leahy Scale: Good Sitting balance - Comments: min guard to close supervision  Standing balance  support: Bilateral upper extremity supported Standing balance-Leahy Scale: Poor Standing balance comment: mod assist, dependent on BUE for standing balance     Special needs/care consideration BiPAP/CPAP : CPAP at home CPM : no Continuous Drip IV : no Dialysis : no        Days : no Life Vest : no Oxygen: no, on RA Special Bed : no Trach Size : no Wound Vac (area) : no      Location : no Skin: abrasion to left knee, ecchymosis to bilateral arms, MASD to bilateral buttocks                          Bowel mgmt: last BM 09/25/19, continent but history of incontinence (per chart) Bladder mgmt: incontinent (external urinary catheter) Diabetic mgmt: yes Behavioral consideration : no Chemo/radiation : not currently (history of thyroid cancer requiring radiation)    Previous Home Environment (from acute therapy documentation) Living Arrangements: Spouse/significant other Available Help at Discharge: Family, Available 24 hours/day, Personal care attendant Type of Home: House Home Layout: One level Home Access: Ramped entrance Bathroom Shower/Tub: Multimedia programmer: Handicapped height Bathroom Accessibility: Yes How Accessible: Accessible via wheelchair Home Care Services: No Additional Comments: PCA assists with bed mobility and dressing (transfers)   Discharge Living Setting Plans for Discharge Living Setting: Patient's home, Lives with (comment)(wife; has caregiver ) Type of Home at Discharge: House Discharge Home Layout: One level Discharge Home Access: [place] entrance Discharge Bathroom Shower/Tub: Walk-in shower Discharge Bathroom Toilet: Standard Discharge Bathroom Accessibility: Yes How Accessible: Accessible via wheelchair Does the patient have any problems obtaining your medications?: No   Social/Family/Support Systems Patient Roles: Spouse Contact Information: [name] (wife): [phone] Anticipated Caregiver: wife + hired caregiver Maudie Mercury) who comes 6days/week  (3hrs in AM, 3 in PM) + 3 hours sunday.  Anticipated Caregiver's Contact Information: wife [name]): [phone] Ability/Limitations of Caregiver: Min A Caregiver Availability: 24/7 Discharge Plan Discussed with Primary Caregiver: Yes Is Caregiver In Agreement with Plan?: Yes Does Caregiver/Family have Issues with Lodging/Transportation while Pt is in Rehab?: No   Goals/Additional Needs Patient/Family Goal for Rehab: PT: Supervision/Min A; OT: Supervision/Min A; SLP: NA Expected length of stay: 14-18 days  Cultural Considerations: TBD Dietary Needs: Heart Healthy, thin liquids  Equipment Needs: TBD Pt/Family Agrees to Admission and willing to participate: Yes Program Orientation Provided & Reviewed with Pt/Caregiver Including Roles  & Responsibilities: Yes(pt and his wife)  Barriers to Discharge: Other (comments)(will need to be 1 person Min A)   Decrease burden of Care through IP rehab admission: NA   Possible need for SNF placement upon discharge: pt has a good prognosis for regaining strength and mobility for ambulation and ADLs through intensive CIR program. He has great social support at DC and an accessible home for a successful return home after IP Rehab stay.    Patient Condition: I have reviewed medical records from [facility], spoken with RN, and patient and spouse. I met with patient at the bedside for inpatient rehabilitation assessment.  Patient will benefit from ongoing PT and OT, can actively participate in 3 hours of therapy a day 5 days of the week, and can make measurable gains during the admission.  Patient will also benefit from the coordinated team approach during an Inpatient Acute Rehabilitation admission.  The patient will receive intensive therapy as well as Rehabilitation physician, nursing, social worker, and care management interventions.  Due to bladder management, bowel management, safety, skin/wound care, disease management, medication administration,  pain management and patient education the patient requires 24 hour a day rehabilitation nursing.  The patient is currently Mod A +2 for sit to stand transfers and Mod A for lateral scoot transfers and Min A to total A for basic ADLs.  Discharge setting and therapy post discharge at home with home health is anticipated.  Patient has agreed to participate in the Acute Inpatient Rehabilitation Program and will admit 09/26/19.   Preadmission Screen Completed By:  Raechel Ache, 09/23/2019 5:17 PM ______________________________________________________________________   Discussed status with Dr. Naaman Plummer on 09/26/19 at 10:33AM and received approval for admission today.   Admission Coordinator:  Raechel Ache, OT, time 10:33AM/Date 09/26/19    Assessment/Plan: Diagnosis: lumbar stenosis with myelopathy/radiculopathy 1. Does the need for close, 24 hr/day Medical supervision in concert with the patient's rehab needs make it unreasonable for this patient to be served in a less intensive setting? Yes 2. Co-Morbidities requiring supervision/potential complications: cdCHF, htn, dm, cervical myelopathy 3. Due to bladder management, bowel management, safety, skin/wound care, disease management, medication administration, pain management and patient education, does the patient require 24 hr/day rehab nursing? Yes 4. Does the patient require coordinated care of a physician, rehab nurse, PT, OT, and SLP to address physical and functional deficits in the context of the above medical diagnosis(es)? Yes Addressing deficits in the following areas: balance, endurance, locomotion, strength, transferring, bowel/bladder control, bathing, dressing, feeding, grooming, toileting and psychosocial support 5. Can the patient actively participate in an intensive therapy program of at least 3 hrs of therapy 5 days a week? Yes 6. The potential for patient to make measurable gains while on inpatient rehab is excellent 7. Anticipated functional  outcomes upon discharge from inpatient rehab: supervision and min assist PT, supervision and min assist OT, n/a SLP 8. Estimated rehab length of stay to reach the above functional goals is: 14-18 days 9. Anticipated discharge destination: Home 10. Overall Rehab/Functional Prognosis: excellent     MD Signature: [name], MD, [facility] 09/26/2019         Revision History

## 2019-09-26 NOTE — Progress Notes (Signed)
Placed patient on CPAP for the night.  

## 2019-09-27 ENCOUNTER — Inpatient Hospital Stay (HOSPITAL_COMMUNITY): Payer: Medicare Other | Admitting: Physical Therapy

## 2019-09-27 ENCOUNTER — Inpatient Hospital Stay (HOSPITAL_COMMUNITY): Payer: Medicare Other | Admitting: Occupational Therapy

## 2019-09-27 LAB — GLUCOSE, CAPILLARY
Glucose-Capillary: 120 mg/dL — ABNORMAL HIGH (ref 70–99)
Glucose-Capillary: 78 mg/dL (ref 70–99)
Glucose-Capillary: 129 mg/dL — ABNORMAL HIGH (ref 70–99)
Glucose-Capillary: 165 mg/dL — ABNORMAL HIGH (ref 70–99)
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
## 2019-09-27 NOTE — Progress Notes (Signed)
[facility] PHYSICAL MEDICINE & REHABILITATION PROGRESS NOTE   Subjective/Complaints: [name] discusses that he would like his code status to be changed to DNR.  He has been satting well on room air during the day; wearing his CPAP at night. Baclofen has been helping with his spasms.  Requests that heart healthy diet be removed- says he will not eat this way at home.    Objective:   No results found. No results for input(s): WBC, HGB, HCT, PLT in the last 72 hours. No results for input(s): NA, K, CL, CO2, GLUCOSE, BUN, CREATININE, CALCIUM in the last 72 hours.  Intake/Output Summary (Last 24 hours) at 09/27/2019 1353 Last data filed at 09/27/2019 1325 Gross per 24 hour  Intake --  Output 4200 ml  Net -4200 ml     Physical Exam: Vital Signs Blood pressure (!) 163/78, pulse 62, temperature 97.6 F (36.4 C), temperature source Oral, resp. rate 18, height 5\' 7"  (1.702 m), weight 97.5 kg, SpO2 97 %. Constitutional:No distress.  HENT:  Head:Normocephalicand atraumatic.  Eyes:Pupils are equal, round, and reactive to light.EOMare normal.  Cardiovascular:Normal rateand regular rhythm. Exam revealsno friction rub. No murmurheard. Respiratory:Effort normal. Norespiratory distress. He hasno wheezes. He hasno rales.  GI: He exhibitsno distension. There isno abdominal tenderness.  Musculoskeletal:  Cervical back: Normal range of motion.  Functional mobility: Transferred from bed to chair with therapist's assistance Neurological: Patient is alert sitting up in bed. Makes good eye contact with examiner and oriented x3.CN exam intact. UE grossly 5/5 with trace to 1+ DTR's. RLE 4/5 prox to distal with pain inhibition. LLE: 2+ to 3+ prox to distal with more pain inhibition. Sensation to LT/temperature decreased in LLE up to nearly umbilicus. DTR's in LE surprisingly brisk at 2+ patella/achilles.  Skin: laceration right index finger, abrasions healing left knee, stage 3  left lateral malleolus with central fibronecrotic tissue. Buttocks slightly red, moist, no breakdown.  Psychiatric: He has anormal mood and affect. Hisbehavior is normal.  Assessment/Plan: 1. Functional deficits secondary to lumbar spinal stenosis which require 3+ hours per day of interdisciplinary therapy in a comprehensive inpatient rehab setting.  Physiatrist is providing close team supervision and 24 hour management of active medical problems listed below.  Physiatrist and rehab team continue to assess barriers to discharge/monitor patient progress toward functional and medical goals  Care Tool:  Bathing              Bathing assist       Upper Body Dressing/Undressing Upper body dressing   What is the patient wearing?: Hospital gown only    Upper body assist      Lower Body Dressing/Undressing Lower body dressing            Lower body assist       Toileting Toileting    Toileting assist Assist for toileting: Moderate Assistance - Patient 50 - 74%     Transfers Chair/bed transfer  Transfers assist     Chair/bed transfer assist level: Maximal Assistance - Patient 25 - 49%     Locomotion Ambulation   Ambulation assist   Ambulation activity did not occur: Safety/medical concerns          Walk 10 feet activity   Assist  Walk 10 feet activity did not occur: Safety/medical concerns        Walk 50 feet activity   Assist Walk 50 feet with 2 turns activity did not occur: Safety/medical concerns  Walk 150 feet activity   Assist Walk 150 feet activity did not occur: Safety/medical concerns         Walk 10 feet on uneven surface  activity   Assist Walk 10 feet on uneven surfaces activity did not occur: Safety/medical concerns         Wheelchair     Assist Will patient use wheelchair at discharge?: Yes Type of Wheelchair: Manual    Wheelchair assist level: Supervision/Verbal cueing, Set up assist Max  wheelchair distance: 171ft    Wheelchair 50 feet with 2 turns activity    Assist        Assist Level: Supervision/Verbal cueing   Wheelchair 150 feet activity     Assist      Assist Level: Minimal Assistance - Patient > 75%   Blood pressure (!) 163/78, pulse 62, temperature 97.6 F (36.4 C), temperature source Oral, resp. rate 18, height 5\' 7"  (1.702 m), weight 97.5 kg, SpO2 97 %.    Medical Problem List and Plan: 1.Decreased functional ability due to lumbar stenosis/radiculopathy. MRI's also suggest (albeit with motion artifact) distal thoracic involvement as well.  Exam is more c/w this too.              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             -consider re-imaging of thoracic spine (perhaps with sedation) to better look at thoracic cord.             -Initial CIR evaluations today.  -patient may shower -ELOS/Goals: 14-18 days, supervision to min assist with PT,OT 2. Antithrombotics: -DVT/anticoagulation:Lovenox. Check vascular study -antiplatelet therapy: Aspirin 81 mg daily 3. Pain Management:Baclofen 15 mg 3 times daily, Neurontin 100 mg nightly, OxyContin 10 mg every 12 hours, hydrocodone as needed as well as Imitrex for headaches -adjust regimen as needed based on efficacy, wean as possible             -oral steroids to taper  -pain is well controlled.  4. Mood:Provide emotional support -antipsychotic agents: N/A 5. Neuropsych: This patientiscapable of making decisions on hisown behalf. 6. Skin/Wound Care:place steristrips over right index finger lac.                          -begin wet to dry to chronic left lateral malleolus wound (stage III), healing well.                           -foam dressing left knee 7. Fluids/Electrolytes/Nutrition:Routine in and outs with follow-up chemistries. Diet changed to regular as per patient's preference. He states he would not follow current diet at home as is unappealing to him. I discussed that our goal is to simulate home environment as medications are adjusted according to current labs/vitals. I discussed that goal of heart healthy diet is to decrease sodium and saturated fat to protect the heart and circulatory system. Discussed his current diet and advised that he cut back on sugary juices, which he likes. Recommended replacing with water. He asked about his [employer] which he and his wife drink a lot of at home. Advised that despite it having no calories, it has a lot  of processed ingredients and his goal should be to minimize processed foods. He expressed understanding. Continue nutritional counseling.  8. Chronic restless leg syndrome. Sinemet 10-100 mg twice daily. 9. Myoclonic jerking. 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 OSA with CPAP/2 L chronic oxygen at home. Continue inhalers 11. 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 Follow-up outpatient 12. Diabetes mellitus. Hemoglobin A1c 5.8. SSI 13. Neurogenic bowel and bladder. Check PVR 14. Hypothyroidism. Synthroid 15. Chronic diastolic congestive heart failure. Monitor for any signs of fluid overload. Check regular weights 16. Hyperlipidemia. Zocor 17. Code status: Changed to DNR as per patient's wishes.   LOS: 1 days A FACE TO FACE EVALUATION WAS PERFORMED  Martha Clan P Taria Castrillo 09/27/2019, 1:53 PM

## 2019-09-27 NOTE — Evaluation (Signed)
Occupational Therapy Assessment and Plan  Patient Details  Name: [name] MRN: [id number] Date of Birth: 1940/11/27  OT Diagnosis: abnormal posture and muscle weakness (generalized) Rehab Potential: Rehab Potential (ACUTE ONLY): Good ELOS: 2 weeks   Today's Date: 09/27/2019 OT Individual Time: 0900-1015  &  1420-1525 OT Individual Time Calculation (min): 75 min   & 65 min  Problem List:  Patient Active Problem List   Diagnosis Date Noted  . Cervical myelopathy ([place]) 09/26/2019  . Diabetes mellitus type 2, controlled, with complications ([place]) 82/42/3536  . Obese 09/17/2019  . Thoracic aortic aneurysm ([place]) 09/17/2019  . Bowel incontinence 09/17/2019  . Bladder incontinence 09/17/2019  . Right hemiparesis ([place]) 09/17/2019  . Acute renal failure (ARF) ([place]) 09/17/2019  . Left hemiparesis ([place]) 09/17/2019  . Chronic respiratory failure with hypoxia ([place]) 09/17/2019  . Syncope and collapse 09/17/2019  . Chronic diastolic CHF (congestive heart failure) ([place]) 09/04/2019  . PVD (peripheral vascular disease) ([place]) 02/14/2019  . Essential hypertension 02/13/2019  . Non-insulin treated type 2 diabetes mellitus ([place]) 02/13/2019  . Severe sepsis ([place]) 01/17/2019  . Community acquired pneumonia of right lower lobe of lung 01/17/2019  . Postural urinary incontinence 05/20/2018  . Myelopathy concurrent with and due to spinal stenosis of cervical region ([place]) 05/20/2018  . Myoclonic jerking 05/20/2018  . 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 History of respiratory failure 08/18/2017  . Hypokalemia 08/18/2017  . Leukocytosis 08/18/2017  . Muscle atrophy of lower extremity 08/09/2017  . Bowel and bladder incontinence 08/09/2017  . Right-sided muscle weakness 08/09/2017  . Neuropathy 08/09/2017  . 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/2017  . Abnormality of gait 07/12/2017  . Myoclonic jerkings, 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/2017  . Acquired  right foot drop 07/12/2017  . UTI (urinary tract infection) due to Enterococcus 07/12/2017  . Pressure injury of skin 06/21/2017  . Acute cystitis 06/20/2017  . Generalized weakness 06/20/2017  . Dehydration 06/20/2017  . Dyspnea 12/26/2016  . Chronic cough 12/26/2016  . Hypersomnia with sleep apnea 10/30/2016  . CSA (central sleep apnea) 10/30/2016  . Wheezing symptom 10/30/2016  . 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 RLS (restless legs syndrome) 08/25/2014  . Primary gout 08/25/2014  . OSA on CPAP 08/25/2014  . Severe obesity (BMI >= 40) ([place]) 08/25/2014  . Obesity (BMI 30-39.9) 02/18/2013  . 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 S/P TKR (total knee replacement) 11/29/2012  . History of thyroid cancer     Past Medical History:  Past Medical History:  Diagnosis Date  . Arthritis   . Barrett esophagus   . Bronchitis   . Carotid artery occlusion   . COPD (chronic obstructive pulmonary disease) ([place])   . Diabetes mellitus without complication ([place])   . GERD (gastroesophageal reflux disease)   . Headache    migraines  . History of thyroid cancer 2008  . Hypertension   . Restless leg   . Sleep apnea with use of continuous positive airway pressure (CPAP)    2011 [facility] , AHI  77cn central and obstrcutive. 16 cm water , 3 cm EPR.   . Thoracic ascending aortic aneurysm ([facility])    4.2 cm ascending TAA 08/18/17 CTA. Annual imaging recommended.  . thyroid ca dx'd 2009 or 2010   surg and radioactive isoptope  . Ulcer    Peptic ulcer disease   Past Surgical History:  Past Surgical History:  Procedure  Laterality Date  . ANTERIOR CERVICAL DECOMP/DISCECTOMY FUSION N/A 09/03/2017   Procedure: ACDF - C3-C4 - C4-C5 - C5-C6;  Surgeon: Kary Kos, MD;  Location: [facility];  Service: Neurosurgery;  Laterality: N/A;  . CAROTID ENDARTERECTOMY Left January 03, 2006   Dr. Amedeo Plenty  . COLONOSCOPY    . EYE SURGERY Bilateral     cataract surgery with lens implants  . game keepers thumb Right   . JOINT REPLACEMENT Left    knee X 2  . ROTATOR CUFF REPAIR Right   . THYROIDECTOMY  2008    Assessment & Plan Clinical Impression: Patient is a 79 y.o. year old male with history of chronic diastolic congestive heart failure, hypertension, diabetes mellitus, OSA on CPAP with 2 L of oxygen at home and quit smoking 14 years ago, chronic restless leg syndrome maintained on Sinemet, myoclonic jerking maintained on Keppra, thoracic ascending aortic aneurysm 4.2 cm 08/18/2017, thyroid cancer, obesity BMI 34.84, myelopathy of C-spine cord with cervical/lumbar radiculopathy and left hemiparesis/spasticity status post ACDF 09/03/2017 followed by Dr. Brett Fairy of neurology services as well as neurosurgery, myoclonic jerking, bowel bladder incontinence. Per chart review patient lives with spouse. 1 level home with ramped entrance. Needed assistance for dressing as well as transfers. He has a PCA who does provide assistance. Patient can ambulate short household distances with a walker. Presented 09/17/2019 with generalized fatigue over the last couple of days and decrease in his functional mobility. Cranial CT scan negative for acute changes. Admission chemistries BUN 43, creatinine 1.64, WBC 13,000, troponin negative, urinalysis negative nitrite with urine culture 30,000 diphtheroids, blood cultures no growth to date. MRI thoracic lumbar cervical spine showed chronic changes with patchy cord signal abnormality at the C3-4 level consistent with chronic myelomalacia. Multilevel degenerative disc disease of the lower thoracic spine without high-grade spinal stenosis as well as lumbar spondylosis and congenitally short pedicles causing prominent impingement at L2-3 and 3-4 with moderate impingement L1-2 and 4-5 as well as T12-L1 and L5-S1. Impingement of L1-2 and 2-3 minimally worsened from prior exam 07/24/2017. Neurosurgery Dr. Kristeen Miss  consulted in regards to MRI findings patient not felt to be a surgical candidate advised IV Solu-Medrol then transition to prolonged prednisone with taper. Echocardiogram completed during work-up shows ejection fraction of 60% no wall motion abnormalities. Chronic pain control ongoing with the use of scheduled baclofen titrated to 15 mg 3 times daily as well as nightly Neurontin and OxyContin 10 mg every 12 hours.  Patient transferred to CIR on 09/26/2019 .    Patient currently requires max with basic self-care skills and IADL secondary to muscle weakness, decreased cardiorespiratoy endurance, unbalanced muscle activation, decreased coordination and decreased motor planning and decreased sitting balance, decreased standing balance and decreased postural control.  Prior to hospitalization, patient could complete adl with mod.  Patient will benefit from skilled intervention to decrease level of assist with basic self-care skills, increase independence with basic self-care skills and increase level of independence with iADL prior to discharge home with care partner.  Anticipate patient will require minimal physical assistance and follow up home health.  OT - End of Session Activity Tolerance: Tolerates 10 - 20 min activity with multiple rests Endurance Deficit: Yes Endurance Deficit Description: requires seated rest breaks OT Assessment Rehab Potential (ACUTE ONLY): Good OT Patient demonstrates impairments in the following area(s): Balance;Endurance;Motor OT Basic ADL's Functional Problem(s): Grooming;Bathing;Dressing;Toileting OT Transfers Functional Problem(s): Toilet;Tub/Shower OT Plan OT Intensity: Minimum of 1-2 x/day, 45 to 90 minutes OT Frequency: 5  out of 7 days OT Duration/Estimated Length of Stay: 2 weeks OT Treatment/Interventions: Balance/vestibular training;Discharge planning;Self Care/advanced ADL retraining;Therapeutic Exercise;Wheelchair propulsion/positioning;DME/adaptive equipment  instruction;UE/LE Strength taining/ROM;Patient/family education;UE/LE Coordination activities;Functional mobility training;Therapeutic Activities OT Self Feeding Anticipated Outcome(s): independent OT Basic Self-Care Anticipated Outcome(s): min A OT Toileting Anticipated Outcome(s): CGA/min A OT Bathroom Transfers Anticipated Outcome(s): CGA OT Recommendation Patient destination: Home Follow Up Recommendations: Home health OT Equipment Recommended: To be determined Equipment Details: owns commode, shower seat, assistive devices   Skilled Therapeutic Intervention AM session:   Patient in bed, awake, alert and aware of needs.  Evaluation completed as documented below.  He reports mild pain left LE which improves with stretching and mobility.  Role of OT, plan of care, schedule, goals for therapy reviewed.  He demonstrates good awareness and understanding of materials provided.   Reviewed various positioning options for safe Adl completion, use of assistive devices and mobility strategies.  LB dressing and bathing completed bed level this session max a.  Rolling in bed mod A, side lying to sitting with mod A.  Squat pivot transfer bed to w/c with mod A.  UB bathing and dressing completed w/c level with min a.  Grooming tasks w/c level with min a.  Reviewed DME and set up of home environment.  He remained seated in w/c at close of session, seat belt alarm set and call bell in reach.   PM session:   Patient in bed, ready for afternoon session.  Requires max A to change wet brief and donn shorts.  Rolling in bed with min/mod A.  Side lying to sitting with mod A.  Squat pivot transfer bed to w.c with mod A.  He is able to propel w.c short distances.  Completed sit to stand in parallel bars - CGA x 4 reps - able to maintain standing with steadying assist for one minute each attempt.  UB conditioning activities completed with focus on proximal stability/scapular ROM and strength.  Trunk ROM and posture  activities.  He remained seated in w/c at close of session with seat belt alarm set and call bell in reach.  He states that he is eager to call his wife and tell her about standing in therapy this session as he has not been able to do that in a number of months.    OT Evaluation Precautions/Restrictions  Precautions Precautions: Fall Restrictions Weight Bearing Restrictions: No General   Vital Signs Therapy Vitals Temp: 98.2 F (36.8 C) Temp Source: Oral Pulse Rate: 62 Resp: 18 BP: (!) 160/72 Patient Position (if appropriate): Lying Oxygen Therapy SpO2: 95 % Pain Pain Assessment Pain Scale: 0-10 Pain Score: 2  Pain Location: Leg Pain Orientation: Left Pain Descriptors / Indicators: Discomfort Pain Intervention(s): Repositioned Home Living/Prior Functioning Home Living Family/patient expects to be discharged to:: Private residence Living Arrangements: Spouse/significant other Available Help at Discharge: Family, Available 24 hours/day, Personal care attendant Type of Home: House Home Access: Ramped entrance Home Layout: One level Bathroom Shower/Tub: Multimedia programmer: Handicapped height Bathroom Accessibility: Yes Additional Comments: PCA assists with bed mobility and dressing (transfers)  Lives With: Spouse Prior Function Level of Independence: Needs assistance with homemaking, Needs assistance with gait, Needs assistance with tranfers, Needs assistance with ADLs  Able to Take Stairs?: No Driving: No Comments: PCA to assist in morning and evening 7-10AM and 7-10PM to help with bathing, dressing, getting in/out of bed. Needs assist for transfers using RW, ambulated at most ~125f 3-4 weeks ago using RW with w/c  follow for safety during outpatient therapy but states a week later "I couldn't even stand up" at therapy. ADL ADL Eating: Set up Where Assessed-Eating: Chair Grooming: Setup Where Assessed-Grooming: Sitting at sink Upper Body Bathing: Minimal  assistance Where Assessed-Upper Body Bathing: Sitting at sink Lower Body Bathing: Maximal assistance Where Assessed-Lower Body Bathing: Bed level Upper Body Dressing: Minimal assistance Where Assessed-Upper Body Dressing: Sitting at sink Lower Body Dressing: Maximal assistance Where Assessed-Lower Body Dressing: Bed level Toileting: Maximal assistance Where Assessed-Toileting: Bed level ADL Comments: no urge to void during OT sessions, condom cath in am, brief wet and changed bed level during pm session Vision Baseline Vision/History: Wears glasses Wears Glasses: Reading only Patient Visual Report: No change from baseline Vision Assessment?: No apparent visual deficits Perception  Perception: Within Functional Limits Praxis Praxis: Intact Cognition Overall Cognitive Status: Within Functional Limits for tasks assessed Arousal/Alertness: Awake/alert Orientation Level: Person;Place;Situation Person: Oriented Place: Oriented Situation: Oriented Year: 2021 Month: April Day of Week: Correct Memory: Appears intact Immediate Memory Recall: Sock;Blue;Bed Memory Recall Sock: Without Cue Memory Recall Blue: Without Cue Memory Recall Bed: Without Cue Attention: Focused;Sustained Focused Attention: Appears intact Sustained Attention: Appears intact Awareness: Appears intact Problem Solving: Appears intact Safety/Judgment: Appears intact Sensation Sensation Light Touch: Impaired Detail Peripheral sensation comments: reports R UE and LE sensation differen then L Light Touch Impaired Details: Impaired RLE;Impaired RUE Coordination Fine Motor Movements are Fluid and Coordinated: No Finger Nose Finger Test: WFL at slow rate 9 Hole Peg Test: R = 48 sec, L = 36 sec,     box & blocks:  R = 32, L = 32 Motor  Motor Motor: Other (comment);Abnormal postural alignment and control Motor - Skilled Clinical Observations: generalized weakness, decreased activity tolerance, impaired  balance Mobility  Bed Mobility Bed Mobility: Supine to Sit;Sit to Supine Supine to Sit: Moderate Assistance - Patient 50-74% Sit to Supine: Contact Guard/Touching assist Transfers Sit to Stand: Moderate Assistance - Patient 50-74% Stand to Sit: Moderate Assistance - Patient 50-74%  Trunk/Postural Assessment  Cervical Assessment Cervical Assessment: Exceptions to WFL(forward head) Thoracic Assessment Thoracic Assessment: Exceptions to WFL(rounded shoulders) Lumbar Assessment Lumbar Assessment: Exceptions to WFL(posterior pelvic tilt in sitting) Postural Control Postural Control: Deficits on evaluation Righting Reactions: delayed, inadequate, and poor motor planning Protective Responses: delayed, inadequate, and poor motor planning Postural Limitations: decreased with posterior lean/LOB  Balance Balance Balance Assessed: Yes Static Sitting Balance Static Sitting - Balance Support: Feet supported;Bilateral upper extremity supported Static Sitting - Level of Assistance: 5: Stand by assistance Dynamic Sitting Balance Dynamic Sitting - Balance Support: Feet supported Dynamic Sitting - Level of Assistance: 4: Min assist Static Standing Balance Static Standing - Balance Support: During functional activity;Bilateral upper extremity supported Static Standing - Level of Assistance: 3: Mod assist Dynamic Standing Balance Dynamic Standing - Balance Support: During functional activity;Bilateral upper extremity supported Dynamic Standing - Level of Assistance: 2: Max assist;3: Mod assist Extremity/Trunk Assessment RUE Assessment Passive Range of Motion (PROM) Comments: WFL Active Range of Motion (AROM) Comments: shoulder flex/abd 90, distal WFL General Strength Comments: proximal weakness limits OH reach, distal 4/5 LUE Assessment Passive Range of Motion (PROM) Comments: WFL Active Range of Motion (AROM) Comments: sh flex/abd 90, distal WFL General Strength Comments: proximal weakness  limits OH reach, distal 4/5     Refer to Care Plan for Long Term Goals  Recommendations for other services: None    Discharge Criteria: Patient will be discharged from OT if patient refuses treatment 3 consecutive times without  medical reason, if treatment goals not met, if there is a change in medical status, if patient makes no progress towards goals or if patient is discharged from hospital.  The above assessment, treatment plan, treatment alternatives and goals were discussed and mutually agreed upon: by patient  [name] 09/27/2019, 4:09 PM

## 2019-09-28 ENCOUNTER — Inpatient Hospital Stay (HOSPITAL_COMMUNITY): Payer: Medicare Other

## 2019-09-28 DIAGNOSIS — M7989 Other specified soft tissue disorders: Secondary | ICD-10-CM

## 2019-09-28 LAB — GLUCOSE, CAPILLARY
Glucose-Capillary: 100 mg/dL — ABNORMAL HIGH (ref 70–99)
Glucose-Capillary: 105 mg/dL — ABNORMAL HIGH (ref 70–99)
Glucose-Capillary: 107 mg/dL — ABNORMAL HIGH (ref 70–99)
Glucose-Capillary: 169 mg/dL — ABNORMAL HIGH (ref 70–99)
Glucose-Capillary: 199 mg/dL — ABNORMAL HIGH (ref 70–99)
Glucose-Capillary: 97 mg/dL (ref 70–99)

## 2019-09-28 MED ORDER — INSULIN ASPART 100 UNIT/ML ~~LOC~~ SOLN
0.0000 [IU] | Freq: Three times a day (TID) | SUBCUTANEOUS | Status: DC
  Administered 2019-09-28: 4 [IU] via SUBCUTANEOUS

## 2019-09-28 NOTE — Progress Notes (Signed)
VASCULAR LAB PRELIMINARY  PRELIMINARY  PRELIMINARY  PRELIMINARY  Bilateral lower extremity venous duplex completed.    Preliminary report:  See CV proc for preliminary results.  Deone Leifheit, RVT 09/28/2019, 4:43 PM

## 2019-09-28 NOTE — Progress Notes (Signed)
[place] PHYSICAL MEDICINE & REHABILITATION PROGRESS NOTE   Subjective/Complaints: Had some left lower extremity pain at site of ankle wound during therapy yesterday and also this morning. Wound assessed and is healing well. Has not been using Norco, requested one dose this morning.  LLE scab also healing well  ROS: Denies SOB, CP, diarrhea, constipation, nausea, vomiting  Objective:   No results found. No results for input(s): WBC, HGB, HCT, PLT in the last 72 hours. No results for input(s): NA, K, CL, CO2, GLUCOSE, BUN, CREATININE, CALCIUM in the last 72 hours.  Intake/Output Summary (Last 24 hours) at 09/28/2019 1329 Last data filed at 09/28/2019 1038 Gross per 24 hour  Intake 720 ml  Output 2375 ml  Net -1655 ml     Physical Exam: Vital Signs Blood pressure 121/83, pulse 80, temperature 98.7 F (37.1 C), temperature source Oral, resp. rate 18, height 5\' 7"  (1.702 m), weight 97.5 kg, SpO2 92 %. Constitutional:No distress.  HENT:  Head:Normocephalicand atraumatic.  Eyes:Pupils are equal, round, and reactive to light.EOMare normal.  Cardiovascular:Normal rateand regular rhythm. Exam revealsno friction rub. No murmurheard. Respiratory:Effort normal. Norespiratory distress. He hasno wheezes. He hasno rales.  GI: He exhibitsno distension. There isno abdominal tenderness.  Musculoskeletal:  Cervical back: Normal range of motion.  Functional mobility: Transferred from bed to chair with therapist's assistance Neurological: Patient is alert sitting up in bed. Makes good eye contact with examiner and oriented x3.CN exam intact. UE grossly 5/5 with trace to 1+ DTR's. RLE 4/5 prox to distal with pain inhibition. LLE: 2+ to 3+ prox to distal with more pain inhibition. Sensation to LT/temperature decreased in LLE up to nearly umbilicus. DTR's in LE surprisingly brisk at 2+ patella/achilles.  Skin: laceration right index finger, abrasions healing well on left knee,  stage 3 left lateral malleolus with central fibronecrotic tissue, healing well. Buttocks slightly red, moist, no breakdown.  Psychiatric: He has anormal mood and affect. Hisbehavior is normal.  Assessment/Plan: 1. Functional deficits secondary to lumbar spinal stenosis which require 3+ hours per day of interdisciplinary therapy in a comprehensive inpatient rehab setting.  Physiatrist is providing close team supervision and 24 hour management of active medical problems listed below.  Physiatrist and rehab team continue to assess barriers to discharge/monitor patient progress toward functional and medical goals  Care Tool:  Bathing    Body parts bathed by patient: Right arm, Left arm, Chest, Abdomen, Front perineal area, Face   Body parts bathed by helper: Buttocks, Right upper leg, Left upper leg, Right lower leg, Left lower leg     Bathing assist Assist Level: Moderate Assistance - Patient 50 - 74%     Upper Body Dressing/Undressing Upper body dressing   What is the patient wearing?: Button up shirt    Upper body assist Assist Level: Minimal Assistance - Patient > 75%    Lower Body Dressing/Undressing Lower body dressing      What is the patient wearing?: Incontinence brief     Lower body assist Assist for lower body dressing: Maximal Assistance - Patient 25 - 49%     Toileting Toileting    Toileting assist Assist for toileting: Moderate Assistance - Patient 50 - 74%     Transfers Chair/bed transfer  Transfers assist     Chair/bed transfer assist level: Maximal Assistance - Patient 25 - 49%     Locomotion Ambulation   Ambulation assist   Ambulation activity did not occur: Safety/medical concerns  Walk 10 feet activity   Assist  Walk 10 feet activity did not occur: Safety/medical concerns        Walk 50 feet activity   Assist Walk 50 feet with 2 turns activity did not occur: Safety/medical concerns         Walk 150 feet  activity   Assist Walk 150 feet activity did not occur: Safety/medical concerns         Walk 10 feet on uneven surface  activity   Assist Walk 10 feet on uneven surfaces activity did not occur: Safety/medical concerns         Wheelchair     Assist Will patient use wheelchair at discharge?: Yes Type of Wheelchair: Manual    Wheelchair assist level: Supervision/Verbal cueing, Set up assist Max wheelchair distance: 179ft    Wheelchair 50 feet with 2 turns activity    Assist        Assist Level: Supervision/Verbal cueing   Wheelchair 150 feet activity     Assist      Assist Level: Minimal Assistance - Patient > 75%   Blood pressure 121/83, pulse 80, temperature 98.7 F (37.1 C), temperature source Oral, resp. rate 18, height 5\' 7"  (1.702 m), weight 97.5 kg, SpO2 92 %.    Medical Problem List and Plan: 1.Decreased functional ability due to lumbar stenosis/radiculopathy. MRI's also suggest (albeit with motion artifact) distal thoracic involvement as well.  Exam is more c/w this too.              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             -consider re-imaging of thoracic spine (perhaps with sedation) to better look at thoracic cord.             -Continue CIR PT, OT -patient may shower -ELOS/Goals: 14-18 days, supervision to min assist with PT,OT 2. Antithrombotics: -DVT/anticoagulation:Lovenox. Check vascular study -antiplatelet therapy: Aspirin 81 mg daily 3. Pain Management:Baclofen 15 mg 3 times daily, Neurontin 100 mg nightly, OxyContin 10 mg every 12 hours, hydrocodone as needed as well as Imitrex for headaches -adjust regimen as needed based on efficacy, wean as possible              -oral steroids to taper  -pain present in therapy yesterday and at rest this morning at left ankle wound site, wound assessed and healing well. Has not been taking hydrocodone; one dose administered this morning.  4. Mood:Provide emotional support -antipsychotic agents: N/A 5. Neuropsych: This patientiscapable of making decisions on hisown behalf. 6. Skin/Wound Care:place steristrips over right index finger lac.                          -continue wet to dry to chronic left lateral malleolus wound (stage III), healing well.                          -foam dressing left knee 7. Fluids/Electrolytes/Nutrition:Routine in and outs with follow-up chemistries. Diet changed to regular as per patient's preference. He states he would not follow current diet at home as is unappealing to him. I discussed that our goal is to simulate home environment as medications are  adjusted according to current labs/vitals. I discussed that goal of heart healthy diet is to decrease sodium and saturated fat to protect the heart and circulatory system. Discussed his current diet and advised that he cut back on sugary juices, which he likes. Recommended replacing with water. He asked about his [employer] which he and his wife drink a lot of at home. Advised that despite it having no calories, it has a lot of processed ingredients and his goal should be to minimize processed foods. He expressed understanding. Continue nutritional counseling.  8. Chronic restless leg syndrome. Sinemet 10-100 mg twice daily. 9. Myoclonic jerking. 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 OSA with CPAP/2 L chronic oxygen at home. Continue inhalers 11. 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 Follow-up outpatient 12. Diabetes mellitus. Hemoglobin A1c 5.8. SSI. Changed to [facility] HS for CBG and SSI 13. Neurogenic bowel and  bladder. Check PVR 14. Hypothyroidism. Synthroid 15. Chronic diastolic congestive heart failure. Monitor for any signs of fluid overload. Check regular weights 16. Hyperlipidemia. Zocor 17. Code status: Changed to DNR as per patient's wishes.   LOS: 2 days A FACE TO FACE EVALUATION WAS PERFORMED  Martha Clan P Ilia Engelbert 09/28/2019, 1:29 PM

## 2019-09-29 ENCOUNTER — Inpatient Hospital Stay (HOSPITAL_COMMUNITY): Payer: Medicare Other

## 2019-09-29 ENCOUNTER — Inpatient Hospital Stay (HOSPITAL_COMMUNITY): Payer: Medicare Other | Admitting: Physical Therapy

## 2019-09-29 LAB — GLUCOSE, CAPILLARY
Glucose-Capillary: 108 mg/dL — ABNORMAL HIGH (ref 70–99)
Glucose-Capillary: 122 mg/dL — ABNORMAL HIGH (ref 70–99)
Glucose-Capillary: 124 mg/dL — ABNORMAL HIGH (ref 70–99)
Glucose-Capillary: 57 mg/dL — ABNORMAL LOW (ref 70–99)
Glucose-Capillary: 68 mg/dL — ABNORMAL LOW (ref 70–99)

## 2019-09-29 LAB — CBC WITH DIFFERENTIAL/PLATELET
Abs Immature Granulocytes: 0.67 10*3/uL — ABNORMAL HIGH (ref 0.00–0.07)
Basophils Absolute: 0.1 10*3/uL (ref 0.0–0.1)
Basophils Relative: 1 %
Eosinophils Absolute: 0 10*3/uL (ref 0.0–0.5)
Eosinophils Relative: 0 %
HCT: 42.7 % (ref 39.0–52.0)
Hemoglobin: 14 g/dL (ref 13.0–17.0)
Immature Granulocytes: 5 %
Lymphocytes Relative: 12 %
Lymphs Abs: 1.6 10*3/uL (ref 0.7–4.0)
MCH: 29.8 pg (ref 26.0–34.0)
MCHC: 32.8 g/dL (ref 30.0–36.0)
MCV: 90.9 fL (ref 80.0–100.0)
Monocytes Absolute: 1.1 10*3/uL — ABNORMAL HIGH (ref 0.1–1.0)
Monocytes Relative: 8 %
Neutro Abs: 9.9 10*3/uL — ABNORMAL HIGH (ref 1.7–7.7)
Neutrophils Relative %: 74 %
Platelets: 215 10*3/uL (ref 150–400)
RBC: 4.7 MIL/uL (ref 4.22–5.81)
RDW: 14.6 % (ref 11.5–15.5)
WBC: 13.3 10*3/uL — ABNORMAL HIGH (ref 4.0–10.5)
nRBC: 0 % (ref 0.0–0.2)

## 2019-09-29 LAB — COMPREHENSIVE METABOLIC PANEL
ALT: 48 U/L — ABNORMAL HIGH (ref 0–44)
AST: 22 U/L (ref 15–41)
Albumin: 3.4 g/dL — ABNORMAL LOW (ref 3.5–5.0)
Alkaline Phosphatase: 51 U/L (ref 38–126)
Anion gap: 10 (ref 5–15)
BUN: 24 mg/dL — ABNORMAL HIGH (ref 8–23)
CO2: 31 mmol/L (ref 22–32)
Calcium: 8.7 mg/dL — ABNORMAL LOW (ref 8.9–10.3)
Chloride: 100 mmol/L (ref 98–111)
Creatinine, Ser: 0.91 mg/dL (ref 0.61–1.24)
GFR calc Af Amer: >60 mL/min (ref >60)
GFR calc non Af Amer: >60 mL/min (ref >60)
Glucose, Bld: 125 mg/dL — ABNORMAL HIGH (ref 70–99)
Potassium: 3.8 mmol/L (ref 3.5–5.1)
Sodium: 141 mmol/L (ref 135–145)
Total Bilirubin: 0.5 mg/dL (ref 0.3–1.2)
Total Protein: 6.2 g/dL — ABNORMAL LOW (ref 6.5–8.1)

## 2019-09-29 NOTE — Progress Notes (Signed)
Hypoglycemic Event  CBG: 68  Treatment: 4 oz juice/soda  Symptoms: None  Follow-up CBG: Time:n/a CBG Result:n/a  Possible Reasons for Event: Medication regimen: sliding scale insulin  Comments/MD notified:Dan Angiulli, PA  Orders received to discontinue blood sugar checks and sliding scale insulin at this time.  Patient is not on any medications for hyperglycemia.      Brita Romp

## 2019-09-29 NOTE — Progress Notes (Signed)
Hypoglycemic Event  CBG: 57  Treatment: juice, 2 crackers  Symptoms: none  Follow-up CBG: [id number] CBG Result:124  Possible Reasons for Event: unk   Comments/MD notified:Dan [place] PA    Antoine Primas

## 2019-09-29 NOTE — Progress Notes (Signed)
Physical Therapy Session Note  Patient Details  Name: [name] MRN: [id number] Date of Birth: [birth year]  Today's Date: 09/29/2019 PT Individual Time: 0800-0900 PT Individual Time Calculation (min): 60 min   Short Term Goals: Week 1:  PT Short Term Goal 1 (Week 1): Pt will perform sit<>stand transfers with CGA PT Short Term Goal 2 (Week 1): Pt will perform stand pivot transfers using RW with mod assist PT Short Term Goal 3 (Week 1): Pt will ambulate at least 60ft using RW with mod assist of 1 (+2 w/c follow if needed)  Skilled Therapeutic Interventions/Progress Updates:    Pt received seated in bed, agreeable to PT session. Pt reports 3/10 pain in LLE at rest, no increase with mobility and declines intervention. Seated in bed to sitting EOB with CGA. Sit to stand with mod A to RW in order to don pants with max A. Pt declines to transfer with RW due to just getting up this AM. Stedy transfer bed to w/c due to pt preference to transfer via this method this AM. Manual w/c propulsion x 150 ft with use of BUE at Supervision level. Sit to stand x 5 reps in // bars with min A. Once in standing pt able to take a few steps forward/back with alt LE with stance LE blocked. Pt does rely heavily on BUE support in standing. Manual w/c propulsion x 100 ft with use of BUE at Supervision level before onset of fatigue. Pt left seated in w/c in room with needs in reach, quick release belt and chair alarm in place  Therapy Documentation Precautions:  Precautions Precautions: Fall Restrictions Weight Bearing Restrictions: No    Therapy/Group: Individual Therapy   Excell Seltzer, PT, DPT  09/29/2019, 11:54 AM

## 2019-09-29 NOTE — Progress Notes (Addendum)
Patient's left lateral ankle wound has been restaged as an unstageable pressure injury. It has been charted that it has 80%slough tissue. Very minimal drainage & has some surrounding redness that blanches. It is tender to the touch. Communication left for the provider on rounds. Blood sugars were repeated this morning & were WNL. Report was given to oncoming nurse.

## 2019-09-29 NOTE — Progress Notes (Signed)
[name] PHYSICAL MEDICINE & REHABILITATION PROGRESS NOTE   Subjective/Complaints:  Per nursing note, L lateral ankle is an unstageable pressure ulcer with 80% slough.  Pt reports up since 4am- upset they checked his BG (was found to be hypoglycemic at the time, so good they checked)- mad they needed to get labs at all- refuses sleep meds- doesn't want them, says won't sleep through everything, no matter what.  LBM Friday- received miralax and another bottle of medicine/sounds like Mg citrate, didn't work yet.  No BM today.   ROS:  Pt denies SOB, abd pain, CP, N/V/C/D, and vision changes   Objective:   VAS [place] LOWER EXTREMITY VENOUS (DVT)  Result Date: 09/28/2019  Lower Venous DVTStudy Indications: Rehab for spinal stenosis surgery  Comparison Study: No prior study on file Performing Technologist: [name] RVS  Examination Guidelines: A complete evaluation includes B-mode imaging, spectral Doppler, color Doppler, and power Doppler as needed of all accessible portions of each vessel. Bilateral testing is considered an integral part of a complete examination. Limited examinations for reoccurring indications may be performed as noted. The reflux portion of the exam is performed with the patient in reverse Trendelenburg.  +---------+---------------+---------+-----------+----------+--------------+ RIGHT    CompressibilityPhasicitySpontaneityPropertiesThrombus Aging +---------+---------------+---------+-----------+----------+--------------+ CFV      Full           Yes      Yes                                 +---------+---------------+---------+-----------+----------+--------------+ SFJ      Full                                                        +---------+---------------+---------+-----------+----------+--------------+ FV Prox  Full                                                        +---------+---------------+---------+-----------+----------+--------------+  FV Mid   Full                                                        +---------+---------------+---------+-----------+----------+--------------+ FV DistalFull                                                        +---------+---------------+---------+-----------+----------+--------------+ PFV      Full                                                        +---------+---------------+---------+-----------+----------+--------------+ POP      Full           Yes      Yes                                 +---------+---------------+---------+-----------+----------+--------------+  PTV      Full                                                        +---------+---------------+---------+-----------+----------+--------------+ PERO     Full                                                        +---------+---------------+---------+-----------+----------+--------------+   +---------+---------------+---------+-----------+----------+--------------+ LEFT     CompressibilityPhasicitySpontaneityPropertiesThrombus Aging +---------+---------------+---------+-----------+----------+--------------+ CFV      Full           Yes      Yes                                 +---------+---------------+---------+-----------+----------+--------------+ SFJ      Full                                                        +---------+---------------+---------+-----------+----------+--------------+ FV Prox  Full                                                        +---------+---------------+---------+-----------+----------+--------------+ FV Mid   Full                                                        +---------+---------------+---------+-----------+----------+--------------+ FV DistalFull                                                        +---------+---------------+---------+-----------+----------+--------------+ PFV      Full                                                         +---------+---------------+---------+-----------+----------+--------------+ POP      Full           Yes      Yes                                 +---------+---------------+---------+-----------+----------+--------------+ PTV      Full                                                        +---------+---------------+---------+-----------+----------+--------------+  PERO     Full                                                        +---------+---------------+---------+-----------+----------+--------------+     Summary: BILATERAL: - No evidence of deep vein thrombosis seen in the lower extremities, bilaterally.   *See table(s) above for measurements and observations.    Preliminary    Recent Labs    09/29/19 0545  WBC 13.3*  HGB 14.0  HCT 42.7  PLT 215   Recent Labs    09/29/19 0829  NA 141  K 3.8  CL 100  CO2 31  GLUCOSE 125*  BUN 24*  CREATININE 0.91  CALCIUM 8.7*    Intake/Output Summary (Last 24 hours) at 09/29/2019 1927 Last data filed at 09/29/2019 1902 Gross per 24 hour  Intake 1192 ml  Output 4500 ml  Net -3308 ml     Physical Exam: Vital Signs Blood pressure 107/66, pulse 77, temperature 98.6 F (37 C), temperature source Oral, resp. rate 16, height 5\' 7"  (1.702 m), weight 97.5 kg, SpO2 93 %. Constitutional:sitting up in bed; nursing at bedside, frustrated about sleep, NAD HENT:  Conjugate gaze; oral mucosa moist Cardiovascular:RRR  Respiratory:CTA b/l good air movement GI: soft, but distended; NT (+) hypoactive BS Musculoskeletal:  Cervical back: Normal range of motion.  Neurological:Ox3 .CN exam intact. UE grossly 5/5 with trace to 1+ DTR's. RLE 4/5 prox to distal with pain inhibition. LLE: 2+ to 3+ prox to distal with more pain inhibition. Sensation to LT/temperature decreased in LLE up to nearly umbilicus. DTR's in LE surprisingly brisk at 2+ patella/achilles.  Skin: laceration right index finger,  abrasions healing well on left knee, stage 3 left lateral malleolus with central fibronecrotic tissue, healing well. Buttocks slightly red, moist, no breakdown.  Psychiatric: pt is irritable, frustrated  Assessment/Plan: 1. Functional deficits secondary to lumbar spinal stenosis with neurogenic claudication which require 3+ hours per day of interdisciplinary therapy in a comprehensive inpatient rehab setting.  Physiatrist is providing close team supervision and 24 hour management of active medical problems listed below.  Physiatrist and rehab team continue to assess barriers to discharge/monitor patient progress toward functional and medical goals  Care Tool:  Bathing    Body parts bathed by patient: Right arm, Left arm, Chest, Abdomen, Front perineal area, Right upper leg, Left upper leg, Face   Body parts bathed by helper: Buttocks, Right lower leg, Left lower leg     Bathing assist Assist Level: Moderate Assistance - Patient 50 - 74%     Upper Body Dressing/Undressing Upper body dressing   What is the patient wearing?: Pull over shirt    Upper body assist Assist Level: Minimal Assistance - Patient > 75%    Lower Body Dressing/Undressing Lower body dressing      What is the patient wearing?: Incontinence brief, Pants     Lower body assist Assist for lower body dressing: Total Assistance - Patient < 25%     Toileting Toileting    Toileting assist Assist for toileting: Dependent - Patient 0%     Transfers Chair/bed transfer  Transfers assist     Chair/bed transfer assist level: Dependent - mechanical lift(stedy)     Locomotion Ambulation   Ambulation assist   Ambulation activity did not occur: Safety/medical concerns  Walk 10 feet activity   Assist  Walk 10 feet activity did not occur: Safety/medical concerns        Walk 50 feet activity   Assist Walk 50 feet with 2 turns activity did not occur: Safety/medical concerns          Walk 150 feet activity   Assist Walk 150 feet activity did not occur: Safety/medical concerns         Walk 10 feet on uneven surface  activity   Assist Walk 10 feet on uneven surfaces activity did not occur: Safety/medical concerns         Wheelchair     Assist Will patient use wheelchair at discharge?: Yes Type of Wheelchair: Manual    Wheelchair assist level: Supervision/Verbal cueing, Set up assist Max wheelchair distance: 140ft    Wheelchair 50 feet with 2 turns activity    Assist        Assist Level: Supervision/Verbal cueing   Wheelchair 150 feet activity     Assist      Assist Level: Supervision/Verbal cueing   Blood pressure 107/66, pulse 77, temperature 98.6 F (37 C), temperature source Oral, resp. rate 16, height 5\' 7"  (1.702 m), weight 97.5 kg, SpO2 93 %.    Medical Problem List and Plan: 1.Decreased functional ability due to lumbar stenosis/radiculopathy with neurogenic claudication. MRI's also suggest (albeit with motion artifact) distal thoracic involvement as well.  Exam is more c/w this too.              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             -consider re-imaging of thoracic spine (perhaps with sedation) to better look at thoracic cord.             -Continue CIR PT, OT -patient may shower -ELOS/Goals: 14-18 days, supervision to min assist with PT,OT 2. Antithrombotics: -DVT/anticoagulation:Lovenox. Check vascular study  4/12- Dopplers (-)- con't lovenox -antiplatelet therapy: Aspirin 81 mg daily 3. Pain Management:Baclofen 15 mg 3 times daily, Neurontin 100 mg nightly, OxyContin 10 mg every 12 hours, hydrocodone as needed as well as Imitrex for  headaches -adjust regimen as needed based on efficacy, wean as possible             -oral steroids to taper  -pain present in therapy yesterday and at rest this morning at left ankle wound site, wound assessed and healing well. Has not been taking hydrocodone; one dose administered this morning.  4. Mood:Provide emotional support -antipsychotic agents: N/A 5. Neuropsych: This patientiscapable of making decisions on hisown behalf. 6. Skin/Wound Care:place steristrips over right index finger lac.                          -continue wet to dry to chronic left lateral malleolus wound (stage -unstagable) 80% sloigh per nursing note..                          -foam dressing left knee 7. Fluids/Electrolytes/Nutrition:Routine in and outs with follow-up chemistries. Diet changed to regular as per patient's preference. He states he would not follow current diet at home as is unappealing to him. I discussed that our goal  is to simulate home environment as medications are adjusted according to current labs/vitals. I discussed that goal of heart healthy diet is to decrease sodium and saturated fat to protect the heart and circulatory system. Discussed his current diet and advised that he cut back on sugary juices, which he likes. Recommended replacing with water. He asked about his [employer] which he and his wife drink a lot of at home. Advised that despite it having no calories, it has a lot of processed ingredients and his goal should be to minimize processed foods. He expressed understanding. Continue nutritional counseling.  8. Chronic restless leg syndrome. Sinemet 10-100 mg twice daily. 9. 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 OSA with CPAP/2 L chronic oxygen at home. Continue inhalers 11. 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 Follow-up  outpatient 12. Diabetes mellitus. Hemoglobin A1c 5.8. SSI. Changed to [facility] HS for CBG and SSI 13. Neurogenic bowel and bladder. Check PVR  4/12- no BM since Friday- if no BM overnight, will give Mg citrate and enema tomorrow.  14. Hypothyroidism. Synthroid 15. Chronic diastolic congestive heart failure. Monitor for any signs of fluid overload. Check regular weights 16. Hyperlipidemia. Zocor 17. Code status: Changed to DNR as per patient's wishes. 18. Leukocytosis   4/12- on prednisone taper- the cause since afebrile and no Sx's of illness-con't steroid taper   LOS: 3 days A FACE TO FACE EVALUATION WAS PERFORMED  Carder Yin 09/29/2019, 7:27 PM

## 2019-09-29 NOTE — IPOC Note (Signed)
Overall Plan of Care [facility]) Patient Details Name: [name] MRN: [id number] DOB: 01-05-1941  Admitting Diagnosis: Neurogenic claudication due to lumbar spinal stenosis  Hospital Problems: Principal Problem:   Neurogenic claudication due to lumbar spinal stenosis Active Problems:   Left hemiparesis ([place])   Cervical myelopathy (H[facility])     Functional Problem List: Nursing Bladder, Bowel, Edema, Endurance, Pain, Motor, Medication Management, Skin Integrity, Sensory  PT Balance, Sensory, Endurance, Motor, Nutrition, Pain, Skin Integrity  OT Balance, Endurance, Motor  SLP    TR         Basic ADL's: OT Grooming, Bathing, Dressing, Toileting     Advanced  ADL's: OT       Transfers: PT Bed Mobility, Bed to Chair, Car, Manufacturing systems engineer, Metallurgist: PT Ambulation, Emergency planning/management officer, Stairs     Additional Impairments: OT    SLP        TR      Anticipated Outcomes Item Anticipated Outcome  Self Feeding independent  Swallowing      Basic self-care  min A  Toileting  CGA/min A   Bathroom Transfers CGA  Bowel/Bladder  Min assist  Transfers  min assist using LRAD  Locomotion  min assist using LRAD short distances  Communication     Cognition     Pain  < 3  Safety/Judgment  Mod I assist   Therapy Plan: PT Intensity: Minimum of 1-2 x/day ,45 to 90 minutes PT Frequency: 5 out of 7 days PT Duration Estimated Length of Stay: ~2 weeeks OT Intensity: Minimum of 1-2 x/day, 45 to 90 minutes OT Frequency: 5 out of 7 days OT Duration/Estimated Length of Stay: 2 weeks     Due to the current state of emergency, patients may not be receiving their 3-hours of Medicare-mandated therapy.   Team Interventions: Nursing Interventions Patient/Family Education, Pain Management, Bladder Management, Medication Management, Bowel Management, Skin Care/Wound Management, Disease Management/Prevention  PT interventions Ambulation/gait training, Community  reintegration, DME/adaptive equipment instruction, Neuromuscular re-education, Psychosocial support, Stair training, UE/LE Strength taining/ROM, Wheelchair propulsion/positioning, Training and development officer, Discharge planning, Functional electrical stimulation, Pain management, Skin care/wound management, Therapeutic Activities, UE/LE Coordination activities, Cognitive remediation/compensation, Disease management/prevention, Functional mobility training, Patient/family education, Splinting/orthotics, Therapeutic Exercise, Visual/perceptual remediation/compensation  OT Interventions Balance/vestibular training, Discharge planning, Self Care/advanced ADL retraining, Therapeutic Exercise, Wheelchair propulsion/positioning, DME/adaptive equipment instruction, UE/LE Strength taining/ROM, Patient/family education, UE/LE Coordination activities, Functional mobility training, Therapeutic Activities  SLP Interventions    TR Interventions    SW/CM Interventions Discharge Planning, Psychosocial Support, Patient/Family Education   Barriers to Discharge MD  Medical stability  Nursing Wound Care    PT Neurogenic Bowel & Bladder    OT      SLP      SW Decreased caregiver support, Lack of/limited family support     Team Discharge Planning: Destination: PT-Home ,OT- Home , SLP-  Projected Follow-up: PT-Home health PT, Outpatient PT, 24 hour supervision/assistance, Other (comment)(HHPT vs OPPT pending progress), OT-  Home health OT, SLP-  Projected Equipment Needs: PT-To be determined, OT- To be determined, SLP-  Equipment Details: PT-has wheelchair and 2 RWs, OT-owns commode, shower seat, assistive devices Patient/family involved in discharge planning: PT- Patient,  OT-Patient, SLP-   MD ELOS: 14 days Medical Rehab Prognosis:  Excellent Assessment: The patient has been admitted for CIR therapies with the diagnosis of lumbar stenosis with neurogenic claudication, ?thoracic myelopathy. The team will be  addressing functional mobility, strength, stamina, balance, safety, adaptive techniques and  equipment, self-care, bowel and bladder mgt, patient and caregiver education, NMR, pain mgt, community reentry, orthotics. Goals have been set at min assist to contact guard with self-care and min assist with mobility.   Due to the current state of emergency, patients may not be receiving their 3 hours per day of Medicare-mandated therapy.    [name], MD, FAAPMR      See Team Conference Notes for weekly updates to the plan of care

## 2019-09-29 NOTE — Plan of Care (Signed)
  Problem: Consults Goal: RH SPINAL CORD INJURY PATIENT EDUCATION Description:  See Patient Education module for education specifics.  Outcome: Progressing Goal: Skin Care Protocol Initiated - if Braden Score 18 or less Description: If consults are not indicated, leave blank or document N/A Outcome: Progressing Goal: Diabetes Guidelines if Diabetic/Glucose > 140 Description: If diabetic or lab glucose is > 140 mg/dl - Initiate Diabetes/Hyperglycemia Guidelines & Document Interventions  Outcome: Progressing   Problem: SCI BOWEL ELIMINATION Goal: RH STG MANAGE BOWEL WITH ASSISTANCE Description: STG Manage Bowel with min Assistance. Outcome: Progressing Goal: RH STG SCI MANAGE BOWEL PROGRAM W/ASSIST OR AS APPROPRIATE Description: STG SCI Manage bowel program w/ min assist or as appropriate. Outcome: Progressing   Problem: SCI BLADDER ELIMINATION Goal: RH STG MANAGE BLADDER WITH ASSISTANCE Description: STG Manage Bladder With min/mod Assistance Outcome: Progressing   Problem: RH SKIN INTEGRITY Goal: RH STG SKIN FREE OF INFECTION/BREAKDOWN Description: Patients skin will remain free from further infection or breakdown with mod assist. Outcome: Progressing Goal: RH STG MAINTAIN SKIN INTEGRITY WITH ASSISTANCE Description: STG Maintain Skin Integrity With mod Assistance. Outcome: Progressing Goal: RH STG ABLE TO PERFORM INCISION/WOUND CARE W/ASSISTANCE Description: STG Able To Perform Incision/Wound Care With total Assistance from caregiver. Outcome: Progressing   Problem: RH SAFETY Goal: RH STG ADHERE TO SAFETY PRECAUTIONS W/ASSISTANCE/DEVICE Description: STG Adhere to Safety Precautions With mod I Assistance/Device. Outcome: Progressing   Problem: RH PAIN MANAGEMENT Goal: RH STG PAIN MANAGED AT OR BELOW PT'S PAIN GOAL Description: < 4 Outcome: Progressing

## 2019-09-29 NOTE — Progress Notes (Signed)
Occupational Therapy Session Note  Patient Details  Name: [name] MRN: [id number] Date of Birth: 1940-09-20  Today's Date: 09/29/2019 OT Individual Time: 1000-1110 OT Individual Time Calculation (min): 70 min    Short Term Goals: Week 1:  OT Short Term Goal 1 (Week 1): patient will complete bed mobility - rolling and supine to/from sitting with CS OT Short Term Goal 2 (Week 1): patient will complete functional transfers with min A using LRAD OT Short Term Goal 3 (Week 1): patient will complete bathing in shower with min A using assistive devices, shower bench and grab bars OT Short Term Goal 4 (Week 1): patient will complete LB dressing with mod A OT Short Term Goal 5 (Week 1): patient will complete toileting with mod A  Skilled Therapeutic Interventions/Progress Updates:    Pt resting in w/c donned in pajamas.  Pt agreeable to washing at sink and changing clothing.  See Care Tool for assist levels.  Pt currently requires use of Stedy for sit<>stand for LB bathing/dressing tasks.  Sit<>stand with Stedy at min A.  Pt able to maintain standing in [place] with BUE support and unable to assist with LB clothing management. Sit<>stand X 6 during session for bathing and dressing. Pt has walk-in shower at home and TTB.  Pt currently uses sliding board to transfer from toilet/BSC to TTB. OT intervention with focus on sit<>stand, activity toleance, BADL training, activity tolerance, and safety awareness to increase independence with BADLs.   Therapy Documentation Precautions:  Precautions Precautions: Fall Restrictions Weight Bearing Restrictions: No  Pain: Pt denies pain this morning   Therapy/Group: Individual Therapy  [name] 09/29/2019, 12:28 PM

## 2019-09-29 NOTE — Progress Notes (Signed)
Occupational Therapy Session Note  Patient Details  Name: [name] MRN: [id number] Date of Birth: 09-05-1940  Today's Date: 09/29/2019 OT Individual Time: 1330-1430 OT Individual Time Calculation (min): 60 min    Short Term Goals: Week 1:  OT Short Term Goal 1 (Week 1): patient will complete bed mobility - rolling and supine to/from sitting with CS OT Short Term Goal 2 (Week 1): patient will complete functional transfers with min A using LRAD OT Short Term Goal 3 (Week 1): patient will complete bathing in shower with min A using assistive devices, shower bench and grab bars OT Short Term Goal 4 (Week 1): patient will complete LB dressing with mod A OT Short Term Goal 5 (Week 1): patient will complete toileting with mod A  Skilled Therapeutic Interventions/Progress Updates:    Pt resting in w/c upon arrival.  Pt commented that his brief was wet.  Sit<>stand with Stedy X 5 for clothing management and hygiene.  Pt dependent for hygiene and clothing management.  Pt requires BUE support when standing in [place].  Pt transitioned to gym and performed scoot transfer to mat with min A. Pt engaged in sit<>stand from EOM with min A from elevated mat and using BUE on chair placed in front.  Pt able to remove hands from chair and place on therapist's shoulders to facilitate upright posture.  B knees unable to fully extend while standing.  Pt performed sit<>stand in this manner X 3 with min A and X 1 with mod A (last attempt). Pt returned to room and remained in w/c with belt alarm activated and all needs within reach.   Therapy Documentation Precautions:  Precautions Precautions: Fall Restrictions Weight Bearing Restrictions: No  Pain:  Pt denies pain   Therapy/Group: Individual Therapy  [name] 09/29/2019, 2:41 PM

## 2019-09-29 NOTE — Care Management (Signed)
[facility] Individual Statement of Services  Patient Name:  [name]  Date:  09/29/2019  Welcome to the [facility].  Our goal is to provide you with an individualized program based on your diagnosis and situation, designed to meet your specific needs.  With this comprehensive rehabilitation program, you will be expected to participate in at least 3 hours of rehabilitation therapies Monday-Friday, with modified therapy programming on the weekends.  Your rehabilitation program will include the following services:  Physical Therapy (PT), Occupational Therapy (OT), 24 hour per day rehabilitation nursing, Therapeutic Recreaction (TR), Psychology, Neuropsychology, Case Management (Social Worker), Rehabilitation Medicine, Nutrition Services, Pharmacy Services and Other  Weekly team conferences will be held on Tuesdays to discuss your progress.  Your Social Worker will talk with you frequently to get your input and to update you on team discussions.  Team conferences with you and your family in attendance may also be held.  Expected length of stay: 2 weeks    Overall anticipated outcome: Minimal Assistance  Depending on your progress and recovery, your program may change. Your Social Worker will coordinate services and will keep you informed of any changes. Your Social Worker's name and contact numbers are listed  below.  The following services may also be recommended but are not provided by the [facility] will be made to provide these services after discharge if needed.  Arrangements include referral to agencies that provide these services.  Your insurance has been verified to be:  [facility] Medicare  Your primary doctor is:  [name]  Pertinent information will be shared with your doctor  and your insurance company.  Social Worker: Loralee Pacas, LCSWA  Information discussed with and copy given to patient by: Rana Snare, 09/29/2019, 11:31 AM

## 2019-09-30 ENCOUNTER — Inpatient Hospital Stay (HOSPITAL_COMMUNITY): Payer: Medicare Other | Admitting: Physical Therapy

## 2019-09-30 ENCOUNTER — Inpatient Hospital Stay (HOSPITAL_COMMUNITY): Payer: Medicare Other | Admitting: Occupational Therapy

## 2019-09-30 ENCOUNTER — Inpatient Hospital Stay (HOSPITAL_COMMUNITY): Payer: Medicare Other

## 2019-09-30 ENCOUNTER — Ambulatory Visit: Payer: Medicare Other

## 2019-09-30 MED ORDER — FLUTICASONE PROPIONATE 50 MCG/ACT NA SUSP
2.0000 | Freq: Every day | NASAL | Status: DC
  Administered 2019-09-30 – 2019-10-06 (×7): 2 via NASAL
  Filled 2019-09-30: qty 16

## 2019-09-30 MED ORDER — TIZANIDINE HCL 2 MG PO TABS
2.0000 mg | ORAL_TABLET | Freq: Three times a day (TID) | ORAL | Status: DC
  Administered 2019-09-30 – 2019-10-24 (×72): 2 mg via ORAL
  Filled 2019-09-30 (×75): qty 1

## 2019-09-30 NOTE — Progress Notes (Signed)
Occupational Therapy Session Note  Patient Details  Name: MARINA MERRYWEATHER MRN: [id number] Date of Birth: 1940/08/09  Today's Date: 09/30/2019 OT Individual Time: [id number] Individual Time Calculation (min): 75 min    Short Term Goals: Week 1:  OT Short Term Goal 1 (Week 1): patient will complete bed mobility - rolling and supine to/from sitting with CS OT Short Term Goal 2 (Week 1): patient will complete functional transfers with min A using LRAD OT Short Term Goal 3 (Week 1): patient will complete bathing in shower with min A using assistive devices, shower bench and grab bars OT Short Term Goal 4 (Week 1): patient will complete LB dressing with mod A OT Short Term Goal 5 (Week 1): patient will complete toileting with mod A  Skilled Therapeutic Interventions/Progress Updates:    OT intervention with focus on bathing at shower level, dressing with sit<>stand using Stedy, standing tolerance in [place], activity tolerance, and safety awareness to increase independence with BADLs. Supine>sit EOB with HOB elevated and use of bed rails with supervision.  Sit<>stand in [place] throughout session with min A. Pt noted with difficulty extending knees in standing and forward flexed posture when standing in [place]. All transfers this morning using Stedy. Pt completed bathing tasks seated on TTB with min A for cleaning buttocks when standing in [place].  Pt used long handle sponge for bathing lower legs. LB dressing with max A sit<>stand in [place]. See Care Tool for assist levels. Pt requires more then a reasonable amount of time to complete tasks.  Pt remained seated in w/c with belt alarm activated and all needs within reach.   Therapy Documentation Precautions:  Precautions Precautions: Fall Restrictions Weight Bearing Restrictions: No Pain: Pain Assessment Pain Scale: 0-10 Pain Score: 7  Pain Type: Chronic pain Pain Location: Leg Pain Orientation: Left;Lateral;Lower Pain Descriptors / Indicators:  Spasm;Aching;Sharp Pain Onset: On-going Patients Stated Pain Goal: 3 Pain Intervention(s): Pt received meds during therapy session  Therapy/Group: Individual Therapy  [name] 09/30/2019, 9:27 AM

## 2019-09-30 NOTE — Progress Notes (Signed)
Orthopedic Tech Progress Note Patient Details:  [name] [birth year] [id number] Called in order to [facility] for [facility] Patient ID: [name], male   DOB: 20-Dec-1940, 79 y.o.   MRN: [id number]   Janit Pagan 09/30/2019, 9:18 AM

## 2019-09-30 NOTE — Progress Notes (Signed)
Physical Therapy Session Note  Patient Details  Name: [name] MRN: [id number] Date of Birth: 1941-02-20  Today's Date: 09/30/2019 PT Individual Time: 1445-1530 PT Individual Time Calculation (min): 45 min   Short Term Goals: Week 1:  PT Short Term Goal 1 (Week 1): Pt will perform sit<>stand transfers with CGA PT Short Term Goal 2 (Week 1): Pt will perform stand pivot transfers using RW with mod assist PT Short Term Goal 3 (Week 1): Pt will ambulate at least 6ft using RW with mod assist of 1 (+2 w/c follow if needed)  Skilled Therapeutic Interventions/Progress Updates:    Pt received seated on toilet in bathroom with stedy placed in front of him. Pt reports being done toileting. Sit to stand with max A from toilet to stedy. Pt is dependent for pericare and donning of new brief and pants. Sit to stand with min A from stedy seat height. Pt performs sit to stand x 10 reps from stedy seat height while pericare and clothing management performed due to decreased endurance and standing tolerance, requires seated rest breaks. Stedy transfer back to w/c. Dependent transport via w/c to therapy gym for time conservation. Sit to stand x 3 reps in // bars with min to mod A and B knees blocked. Pt able to tolerate standing x 30 sec max with focus on B knee extension and hip extension in standing. Manual w/c propulsion x 100 ft with use of BUE at Supervision level back towards pt's room. Pt left seated in w/c in room with needs in reach, quick release belt and chair alarm in place at end of session, pt's wife present.  Therapy Documentation Precautions:  Precautions Precautions: Fall Restrictions Weight Bearing Restrictions: No    Therapy/Group: Individual Therapy   Excell Seltzer, PT, DPT  09/30/2019, 3:52 PM

## 2019-09-30 NOTE — Progress Notes (Signed)
This morning, patient called to the nurses station asking for pain medication. When the nurse got to the room, pain medication was given & patient stated that he felt cold & thinks that he wet the bed. When checked, the patients bed was soaked. The condom catheter was still attached , but was compromised. Yesterday, this occurred while patient was sitting in the recliner, which required a for his clothing & padding for the chair to be changed. He required a whole bed change this morning, the condom catheter was removed & another brief applied. During the process, noted that his left ankle dressing was completely off. He was informed & stated that it probably was the reason that his ankle was hurting. The wound has less slough tissue, but the rim of the wound last night was macerated & it had surrounding redness. This morning, more of the surrounding redness is noted with minimal to no drainage. It looks very irritated. His feet were elevated on a pillow last night but it seems that he moves around because it was in place. He may need a boot that will help with keeping his heels & ankles off the bed. His feet both turn outward & both lateral malleolus are in contact with the bed. Patients skin was cleansed, skin barrier applied & he was left in a comfortable position with call bell in place. He had a large bowel movement last night. No c/o abdominal discomfort. He was hesitant to take his miralax last night. He was reminded that it had been 5 day since his last BM & that during those 5 days, he also took miralax. He had to take extra prn bowel medication to have the BM last night. His normal bowel pattern was discussed along with causes of constipation. He verbalized understanding of prevention methods & narcotic usage. Will continue to monitor for changes

## 2019-09-30 NOTE — Plan of Care (Signed)
  Problem: Consults Goal: RH SPINAL CORD INJURY PATIENT EDUCATION Description:  See Patient Education module for education specifics.  Outcome: Progressing Goal: Skin Care Protocol Initiated - if Braden Score 18 or less Description: If consults are not indicated, leave blank or document N/A Outcome: Progressing Goal: Diabetes Guidelines if Diabetic/Glucose > 140 Description: If diabetic or lab glucose is > 140 mg/dl - Initiate Diabetes/Hyperglycemia Guidelines & Document Interventions  Outcome: Progressing   Problem: SCI BOWEL ELIMINATION Goal: RH STG MANAGE BOWEL WITH ASSISTANCE Description: STG Manage Bowel with min Assistance. Outcome: Progressing Goal: RH STG SCI MANAGE BOWEL PROGRAM W/ASSIST OR AS APPROPRIATE Description: STG SCI Manage bowel program w/ min assist or as appropriate. Outcome: Progressing   Problem: SCI BLADDER ELIMINATION Goal: RH STG MANAGE BLADDER WITH ASSISTANCE Description: STG Manage Bladder With min/mod Assistance Outcome: Progressing   Problem: RH SKIN INTEGRITY Goal: RH STG SKIN FREE OF INFECTION/BREAKDOWN Description: Patients skin will remain free from further infection or breakdown with mod assist. Outcome: Progressing Goal: RH STG MAINTAIN SKIN INTEGRITY WITH ASSISTANCE Description: STG Maintain Skin Integrity With mod Assistance. Outcome: Progressing Goal: RH STG ABLE TO PERFORM INCISION/WOUND CARE W/ASSISTANCE Description: STG Able To Perform Incision/Wound Care With total Assistance from caregiver. Outcome: Progressing   Problem: RH SAFETY Goal: RH STG ADHERE TO SAFETY PRECAUTIONS W/ASSISTANCE/DEVICE Description: STG Adhere to Safety Precautions With mod I Assistance/Device. Outcome: Progressing   Problem: RH PAIN MANAGEMENT Goal: RH STG PAIN MANAGED AT OR BELOW PT'S PAIN GOAL Description: < 4 Outcome: Progressing

## 2019-09-30 NOTE — Progress Notes (Signed)
Social Work Patient ID: [name], male   DOB: [birth year], 79 y.o.   MRN: [id number]   SW met with pt in room to provide updates from team conference, and d/c date 10/11/2019. Pt aware SW to follow-up with his wife.   SW called pt wife [name] [phone]) to inform on above. Confirms pt will have support 3hr in AM/PM to help assist with care needs. Discussion on family education. Wife aware family education will be scheduled closer towards d/c. No further questions/concerns.   Loralee Pacas, MSW, [place] Office: [phone] Cell: [phone] Fax: [phone]

## 2019-09-30 NOTE — Progress Notes (Signed)
[facility] PHYSICAL MEDICINE & REHABILITATION PROGRESS NOTE   pt reports had a extra large BM last night- almost had to call plumber since clogged toilet.  Per nurse, pt's lateral malleoli keep touching bed- needs boots.  Pt reports condom cath was compromised las tnight/leaking and was wet/cold at 4am- "no one's fault".  Has his "normal cough" at this time of year due to pollen- willing to ask for albuterol and try flonase for this.   Also c/o severe spasticity of LLE this AM- won't stop spasming.   ROS:   Pt denies SOB, abd pain, CP, N/V/C/D, and vision changes   Objective:   VAS [place] LOWER EXTREMITY VENOUS (DVT)  Result Date: 09/28/2019  Lower Venous DVTStudy Indications: Rehab for spinal stenosis surgery  Comparison Study: No prior study on file Performing Technologist: Sharion Dove RVS  Examination Guidelines: A complete evaluation includes B-mode imaging, spectral Doppler, color Doppler, and power Doppler as needed of all accessible portions of each vessel. Bilateral testing is considered an integral part of a complete examination. Limited examinations for reoccurring indications may be performed as noted. The reflux portion of the exam is performed with the patient in reverse Trendelenburg.  +---------+---------------+---------+-----------+----------+--------------+ RIGHT    CompressibilityPhasicitySpontaneityPropertiesThrombus Aging +---------+---------------+---------+-----------+----------+--------------+ CFV      Full           Yes      Yes                                 +---------+---------------+---------+-----------+----------+--------------+ SFJ      Full                                                        +---------+---------------+---------+-----------+----------+--------------+ FV Prox  Full                                                        +---------+---------------+---------+-----------+----------+--------------+ FV Mid   Full                                                         +---------+---------------+---------+-----------+----------+--------------+ FV DistalFull                                                        +---------+---------------+---------+-----------+----------+--------------+ PFV      Full                                                        +---------+---------------+---------+-----------+----------+--------------+ POP      Full           Yes      Yes                                 +---------+---------------+---------+-----------+----------+--------------+  PTV      Full                                                        +---------+---------------+---------+-----------+----------+--------------+ PERO     Full                                                        +---------+---------------+---------+-----------+----------+--------------+   +---------+---------------+---------+-----------+----------+--------------+ LEFT     CompressibilityPhasicitySpontaneityPropertiesThrombus Aging +---------+---------------+---------+-----------+----------+--------------+ CFV      Full           Yes      Yes                                 +---------+---------------+---------+-----------+----------+--------------+ SFJ      Full                                                        +---------+---------------+---------+-----------+----------+--------------+ FV Prox  Full                                                        +---------+---------------+---------+-----------+----------+--------------+ FV Mid   Full                                                        +---------+---------------+---------+-----------+----------+--------------+ FV DistalFull                                                        +---------+---------------+---------+-----------+----------+--------------+ PFV      Full                                                         +---------+---------------+---------+-----------+----------+--------------+ POP      Full           Yes      Yes                                 +---------+---------------+---------+-----------+----------+--------------+ PTV      Full                                                        +---------+---------------+---------+-----------+----------+--------------+  PERO     Full                                                        +---------+---------------+---------+-----------+----------+--------------+     Summary: BILATERAL: - No evidence of deep vein thrombosis seen in the lower extremities, bilaterally.   *See table(s) above for measurements and observations.    Preliminary    Recent Labs    09/29/19 0545  WBC 13.3*  HGB 14.0  HCT 42.7  PLT 215   Recent Labs    09/29/19 0829  NA 141  K 3.8  CL 100  CO2 31  GLUCOSE 125*  BUN 24*  CREATININE 0.91  CALCIUM 8.7*    Intake/Output Summary (Last 24 hours) at 09/30/2019 0857 Last data filed at 09/30/2019 0700 Gross per 24 hour  Intake 709 ml  Output 1900 ml  Net -1191 ml     Physical Exam: Vital Signs Blood pressure (!) 153/74, pulse 63, temperature (!) 97.3 F (36.3 C), temperature source Oral, resp. rate 16, height 5\' 7"  (1.702 m), weight 97.5 kg, SpO2 97 %. Constitutional:laying supine in bed; appropriate, NAD HENT:  Conjugate gaze  Cardiovascular:RRR; no JVD Respiratory:CTA B/L- occ wheeze heard B/L- good air movement GI: Soft, NT, ND, (+)BS  Musculoskeletal:  Cervical back: Normal range of motion.  Neurological:Ox3 .CN exam intact. UE grossly 5/5 with trace to 1+ DTR's. RLE 4/5 prox to distal with pain inhibition. LLE: 2+ to 3+ prox to distal with more pain inhibition. Sensation to LT/temperature decreased in LLE up to nearly umbilicus. DTR's in LE surprisingly brisk at 2+ patella/achilles.  Multiple spasms of LLE seen (says worse on oral prednisone) Skin: laceration right index finger,  abrasions healing well on left knee, stage 3 left lateral malleolus with central fibronecrotic tissue, healing well. Buttocks slightly red, moist, no breakdown.  Psychiatric:pt appropriate  Assessment/Plan: 1. Functional deficits secondary to lumbar spinal stenosis with neurogenic claudication which require 3+ hours per day of interdisciplinary therapy in a comprehensive inpatient rehab setting.  Physiatrist is providing close team supervision and 24 hour management of active medical problems listed below.  Physiatrist and rehab team continue to assess barriers to discharge/monitor patient progress toward functional and medical goals  Care Tool:  Bathing    Body parts bathed by patient: Right arm, Left arm, Chest, Abdomen, Front perineal area, Right upper leg, Left upper leg, Face   Body parts bathed by helper: Buttocks, Right lower leg, Left lower leg     Bathing assist Assist Level: Moderate Assistance - Patient 50 - 74%     Upper Body Dressing/Undressing Upper body dressing   What is the patient wearing?: Pull over shirt    Upper body assist Assist Level: Minimal Assistance - Patient > 75%    Lower Body Dressing/Undressing Lower body dressing      What is the patient wearing?: Incontinence brief, Pants     Lower body assist Assist for lower body dressing: Total Assistance - Patient < 25%     Toileting Toileting    Toileting assist Assist for toileting: Dependent - Patient 0%     Transfers Chair/bed transfer  Transfers assist     Chair/bed transfer assist level: Dependent - mechanical lift(stedy)     Locomotion Ambulation   Ambulation assist   Ambulation activity did not  occur: Safety/medical concerns          Walk 10 feet activity   Assist  Walk 10 feet activity did not occur: Safety/medical concerns        Walk 50 feet activity   Assist Walk 50 feet with 2 turns activity did not occur: Safety/medical concerns         Walk 150  feet activity   Assist Walk 150 feet activity did not occur: Safety/medical concerns         Walk 10 feet on uneven surface  activity   Assist Walk 10 feet on uneven surfaces activity did not occur: Safety/medical concerns         Wheelchair     Assist Will patient use wheelchair at discharge?: Yes Type of Wheelchair: Manual    Wheelchair assist level: Supervision/Verbal cueing, Set up assist Max wheelchair distance: 131ft    Wheelchair 50 feet with 2 turns activity    Assist        Assist Level: Supervision/Verbal cueing   Wheelchair 150 feet activity     Assist      Assist Level: Supervision/Verbal cueing   Blood pressure (!) 153/74, pulse 63, temperature (!) 97.3 F (36.3 C), temperature source Oral, resp. rate 16, height 5\' 7"  (1.702 m), weight 97.5 kg, SpO2 97 %.    Medical Problem List and Plan: 1.Decreased functional ability due to lumbar stenosis/radiculopathy with neurogenic claudication. MRI's also suggest (albeit with motion artifact) distal thoracic involvement as well.  Exam is more c/w this too.              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             -consider re-imaging of thoracic spine (perhaps with sedation) to better look at thoracic cord.  4/13- will give pt PRAFOs B/L to prevent heel/ankle skin breakdown at night             -Continue CIR PT, OT -patient may shower -ELOS/Goals: 14-18 days, supervision to min assist with PT,OT 2. Antithrombotics: -DVT/anticoagulation:Lovenox. Check vascular study  4/12- Dopplers (-)- con't lovenox -antiplatelet therapy: Aspirin 81 mg daily 3. Pain Management:Baclofen 15 mg 3 times daily, Neurontin 100 mg nightly, OxyContin 10 mg every 12 hours,  hydrocodone as needed as well as Imitrex for headaches -adjust regimen as needed based on efficacy, wean as possible             -oral steroids to taper  -pain present in therapy yesterday and at rest this morning at left ankle wound site, wound assessed and healing well. Has not been taking hydrocodone; one dose administered this morning.   4/13- will add Zanaflex 2 mg TID for spasticity 4. Mood:Provide emotional support -antipsychotic agents: N/A 5. Neuropsych: This patientiscapable of making decisions on hisown behalf. 6. Skin/Wound Care:place steristrips over right index finger lac.                          -continue wet to dry to chronic left lateral malleolus wound (stage -unstagable) 80% sloigh per nursing note..                          -foam dressing  left knee 7. Fluids/Electrolytes/Nutrition:Routine in and outs with follow-up chemistries. Diet changed to regular as per patient's preference. He states he would not follow current diet at home as is unappealing to him. I discussed that our goal is to simulate home environment as medications are adjusted according to current labs/vitals. I discussed that goal of heart healthy diet is to decrease sodium and saturated fat to protect the heart and circulatory system. Discussed his current diet and advised that he cut back on sugary juices, which he likes. Recommended replacing with water. He asked about his [employer] which he and his wife drink a lot of at home. Advised that despite it having no calories, it has a lot of processed ingredients and his goal should be to minimize processed foods. He expressed understanding. Continue nutritional counseling.  8. Chronic restless leg syndrome. Sinemet 10-100 mg twice daily. 9. 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 OSA with CPAP/2 L chronic oxygen  at home. Continue inhalers 11. 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 Follow-up outpatient 12. Diabetes mellitus. Hemoglobin A1c 5.8. SSI. Changed to [facility] for CBG and SSI 13. Neurogenic bowel and bladder. Check PVR  4/12- no BM since Friday- if no BM overnight, will give Mg citrate and enema tomorrow.   4/13- had extra large BM last night- con't regimen 14. Hypothyroidism. Synthroid 15. Chronic diastolic congestive heart failure. Monitor for any signs of fluid overload. Check regular weights   Filed Weights   09/26/19 1448  Weight: 97.5 kg   4/13- will monitor for trends 16. Hyperlipidemia. Zocor 17. Code status: Changed to DNR as per patient's wishes. 18. Leukocytosis   4/12- on prednisone taper- the cause since afebrile and no Sx's of illness-con't steroid taper 19. Wheezing  4/13- asked pt to ask for prn albuterol and added Flonase for allergies/cough  LOS: 4 days A FACE TO FACE EVALUATION WAS PERFORMED  Hawraa Stambaugh 09/30/2019, 8:57 AM

## 2019-09-30 NOTE — Patient Care Conference (Signed)
Inpatient RehabilitationTeam Conference and Plan of Care Update Date: 09/30/2019   Time: 11:20 AM    Patient Name: [name]      Medical Record Number: [id number]  Date of Birth: [birth year] Sex: Male         Room/Bed: 4M07C/4M07C-01 Payor Info: Payor: [employer] / Plan: [employer] MEDICARE / Product Type: *No Product type* /    Admit Date/Time:  09/26/2019  1:13 PM  Primary Diagnosis:  Neurogenic claudication due to lumbar spinal stenosis  Patient Active Problem List   Diagnosis Date Noted  . Cervical myelopathy ([place]) 09/26/2019  . Diabetes mellitus type 2, controlled, with complications ([place]) [id number]  . Obese 09/17/2019  . Thoracic aortic aneurysm ([place]) 09/17/2019  . Bowel incontinence 09/17/2019  . Bladder incontinence 09/17/2019  . Right hemiparesis ([place]) 09/17/2019  . Acute renal failure (ARF) ([place]) 09/17/2019  . Left hemiparesis ([place]) 09/17/2019  . Chronic respiratory failure with hypoxia ([place]) 09/17/2019  . Syncope and collapse 09/17/2019  . Chronic diastolic CHF (congestive heart failure) ([place]) 09/04/2019  . PVD (peripheral vascular disease) ([place]) 02/14/2019  . Essential hypertension 02/13/2019  . Non-insulin treated type 2 diabetes mellitus ([place]) 02/13/2019  . Severe sepsis ([place]) 01/17/2019  . Community acquired pneumonia of right lower lobe of lung 01/17/2019  . Postural urinary incontinence 05/20/2018  . Myelopathy concurrent with and due to spinal stenosis of cervical region ([place]) 05/20/2018  . Myoclonic jerking 05/20/2018  . 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 History of respiratory failure 08/18/2017  . Hypokalemia 08/18/2017  . Leukocytosis 08/18/2017  . Muscle atrophy of lower extremity 08/09/2017  . Bowel and bladder incontinence 08/09/2017  . Right-sided muscle weakness 08/09/2017  . Neuropathy 08/09/2017  . 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/2017  . Abnormality of gait  07/12/2017  . Myoclonic jerkings, 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/2017  . Acquired right foot drop 07/12/2017  . UTI (urinary tract infection) due to Enterococcus 07/12/2017  . Pressure injury of skin 06/21/2017  . Acute cystitis 06/20/2017  . Generalized weakness 06/20/2017  . Dehydration 06/20/2017  . Dyspnea 12/26/2016  . Chronic cough 12/26/2016  . Hypersomnia with sleep apnea 10/30/2016  . CSA (central sleep apnea) 10/30/2016  . Wheezing symptom 10/30/2016  . 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 RLS (restless legs syndrome) 08/25/2014  . Primary gout 08/25/2014  . OSA on CPAP 08/25/2014  . Severe obesity (BMI >= 40) ([place]) 08/25/2014  . Obesity (BMI 30-39.9) 02/18/2013  . 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 S/P TKR (total knee replacement) 11/29/2012  . History of thyroid cancer     Expected Discharge Date: Expected Discharge Date: 10/11/19  Team Members Present: Physician leading conference: Dr. Courtney Heys Care Coodinator Present: Karene Fry, RN, MSN;Christina Sampson Goon, BSW;Auria Barbra Sarks, [place] Nurse Present: Other (comment)(Tomeka Pugh, LPN) PT Present: Excell Seltzer, PT OT Present: Roanna Epley, COTA PPS Coordinator present : Gunnar Fusi, SLP     Current Status/Progress Goal Weekly Team Focus  Bowel/Bladder   incontinent of bowel & bladder, LBM 4/12  less incontinent episodes  timed toileting   Swallow/Nutrition/ Hydration             ADL's   mod A bathing with sit<>stand in [place]; min A UB dressing, max/tot A LB dressing; mod A squat pivot transfers; standing in [place]  supervision overall, min A LB dressing  sit<>stand, standing balance, functional transfers, BADL training, activity tolerance, safety awareness  Mobility   CGA bed mobility, mod A sit to stand to RW, max A transfers with RW vs transfer with stedy, Supervision w/c mobility  min A overall  sit to stand, transfers, gait initiation    Communication             Safety/Cognition/ Behavioral Observations            Pain   c/o pain to left lateral ankle, has tylenol, norco prn & xanaflex scheduled  pain scale <2/10  assess & treat as needed   Skin   has an unstageable ulcer to the left lateral ankle, abrasions & skin tears  no new breakdown & signs of healing to pressure injury  assess q shift    Rehab Goals Patient on target to meet rehab goals: Yes *See Care Plan and progress notes for long and short-term goals.     Barriers to Discharge  Current Status/Progress Possible Resolutions Date Resolved   Nursing                  PT  Neurogenic Bowel & Bladder                 OT                  SLP                SW Decreased caregiver support;Lack of/limited family support              Discharge Planning/Teaching Needs:  Pt to d/c to home with his wife who will provide 24/7 care  Family education as recommended by therapy   Team Discussion: MD chronic L hemi, myelopathy C-spine, lumbar stenosis, spasticity worse, meds added, CHF, daily weights, on steroids, wheezing dt allergies.  RN chronic incont, cont BM yesterday, condom cath at night, L ankle red, wet to dry drsgs.  PT CGA bed, mod/max stand pivot, few steps mod A parallel bars.  OT shower bathing min A, has tub transfer bench at home, inc urine, min A sit to stand, goals min/S.   Revisions to Treatment Plan: N/A     Medical Summary Current Status: incontient of bladder, continent of bowel; L ankle wound Weekly Focus/Goal: PT_ CGA bed mobility- mod-max to stand with RW: w/c 156ft Supervision- gets tired; few steps mod A paraell bars  Barriers to Discharge: Behavior;Decreased family/caregiver support;Home enviroment access/layout;Incontinence;Neurogenic Bowel & Bladder;Weight;Wound care;Weight bearing restrictions;Other (comments)  Barriers to Discharge Comments: L ankle wound- incomplete paraplegia Possible Resolutions to Barriers: bathing shower min  assist; no awareness of bladder incontinence; cera steady sit-stand min assist; w/c min assist   Continued Need for Acute Rehabilitation Level of Care: The patient requires daily medical management by a physician with specialized training in physical medicine and rehabilitation for the following reasons: Direction of a multidisciplinary physical rehabilitation program to maximize functional independence : Yes Medical management of patient stability for increased activity during participation in an intensive rehabilitation regime.: Yes Analysis of laboratory values and/or radiology reports with any subsequent need for medication adjustment and/or medical intervention. : Yes   I attest that I was present, lead the team conference, and concur with the assessment and plan of the team.   Retta Diones 10/01/2019, 11:54 AM   Team conference was held via web/ teleconference due to [place] - 19

## 2019-09-30 NOTE — Progress Notes (Signed)
Occupational Therapy Session Note  Patient Details  Name: [name] MRN: [id number] Date of Birth: 1941/01/01  Today's Date: 09/30/2019 OT Individual Time: 1300-1330 OT Individual Time Calculation (min): 30 min    Short Term Goals: Week 1:  OT Short Term Goal 1 (Week 1): patient will complete bed mobility - rolling and supine to/from sitting with CS OT Short Term Goal 2 (Week 1): patient will complete functional transfers with min A using LRAD OT Short Term Goal 3 (Week 1): patient will complete bathing in shower with min A using assistive devices, shower bench and grab bars OT Short Term Goal 4 (Week 1): patient will complete LB dressing with mod A OT Short Term Goal 5 (Week 1): patient will complete toileting with mod A  Skilled Therapeutic Interventions/Progress Updates:    OT intervention with focus on functional transfers and sit<>stand from EOM.  Pt performed scoot transfer w/c>EOM with min A to reposition on mat.  Sit<>stand with chair positioned in front of pt.  Pt performed sit<>stand from EOB (slightly elevated) using chair arms to pull on with CGA.  Pt unable to fully extend knees in standing while resting BUE on therapist shoulders for balance. Pt also performed sit<>stand from EOM with one UE on arm of chair and one pushing from mat.  Pt required min A for sit<>stand.  Pt performed squat/scoot transfer back to w/c with min A. Pt returned to room and remained in w/c with all needs within reach and belt alarm activated.  Pt encouraged by progress today.   Therapy Documentation Precautions:  Precautions Precautions: Fall Restrictions Weight Bearing Restrictions: No Pain:  Pt denies pain this afternoon   Therapy/Group: Individual Therapy  [name] 09/30/2019, 2:21 PM

## 2019-09-30 NOTE — Progress Notes (Addendum)
Occupational Therapy Session Note  Patient Details  Name: [name] MRN: [id number] Date of Birth: 12-24-1940  Today's Date: 09/30/2019 OT Individual Time: 1115-1200 OT Individual Time Calculation (min): 45 min    Short Term Goals: Week 1:  OT Short Term Goal 1 (Week 1): patient will complete bed mobility - rolling and supine to/from sitting with CS OT Short Term Goal 2 (Week 1): patient will complete functional transfers with min A using LRAD OT Short Term Goal 3 (Week 1): patient will complete bathing in shower with min A using assistive devices, shower bench and grab bars OT Short Term Goal 4 (Week 1): patient will complete LB dressing with mod A OT Short Term Goal 5 (Week 1): patient will complete toileting with mod A  Skilled Therapeutic Interventions/Progress Updates:  Patient met seated in wc in agreement with OT treatment session with focus on therapeutic activity and therapeutic exercise as detailed below. Wc mobility from room to ortho gym with supervision A. Patient completed squat-pivot transfer to and from wc via scooting with assistance to boost hips. Reciprocal scooting toward edge of mat and sit to stand from mat table to RW x5 reps with Min-Mod A for initial boost. Once standing, patient able to maintain static standing balance with Min A. Cues provided for hand placement on RW and for pushing from base of support to facilitate BLE strengthening. Seated on mat table, patient performed 2 sets x15 resp each of BUE HEP to improve BUE strength. Patient transported back to room with total assist for time management. Session concluded with patient seated in wc with ball bell within reach, belt alarm activated, and all needs met.   Therapy Documentation Precautions:  Precautions Precautions: Fall Restrictions Weight Bearing Restrictions: No   Therapy/Group: Individual Therapy  Annalise Mcdiarmid R [place] 09/30/2019, 11:46 AM

## 2019-10-01 ENCOUNTER — Inpatient Hospital Stay (HOSPITAL_COMMUNITY): Payer: Medicare Other | Admitting: Physical Therapy

## 2019-10-01 ENCOUNTER — Inpatient Hospital Stay (HOSPITAL_COMMUNITY): Payer: Medicare Other

## 2019-10-01 MED ORDER — ALBUTEROL SULFATE (2.5 MG/3ML) 0.083% IN NEBU
2.5000 mg | INHALATION_SOLUTION | Freq: Every day | RESPIRATORY_TRACT | Status: DC
  Administered 2019-10-02 – 2019-10-17 (×16): 2.5 mg via RESPIRATORY_TRACT
  Filled 2019-10-01 (×16): qty 3

## 2019-10-01 NOTE — Progress Notes (Signed)
[place] PHYSICAL MEDICINE & REHABILITATION PROGRESS NOTE   Pt reports if gets meds on time, spasticity is well controlled- c/o wheezing as well.    ROS:   Pt denies SOB, abd pain, CP, N/V/C/D, and vision changes   Objective:   No results found. Recent Labs    09/29/19 0545  WBC 13.3*  HGB 14.0  HCT 42.7  PLT 215   Recent Labs    09/29/19 0829  NA 141  K 3.8  CL 100  CO2 31  GLUCOSE 125*  BUN 24*  CREATININE 0.91  CALCIUM 8.7*    Intake/Output Summary (Last 24 hours) at 10/01/2019 0855 Last data filed at 09/30/2019 1849 Gross per 24 hour  Intake 444 ml  Output --  Net 444 ml     Physical Exam: Vital Signs Blood pressure (!) 146/69, pulse 64, temperature 97.6 F (36.4 C), temperature source Oral, resp. rate 20, height 5\' 7"  (1.702 m), weight 98.4 kg, SpO2 97 %. Constitutional:awake, alert, sitting up in bed; audible wheezing, NAD HENT:  Conjugate gaze  Cardiovascular:RRR- no JVD Respiratory:good air movement, but very wheezy throughout- on exam and audible GI: Soft, NT, ND, (+)BS   Musculoskeletal:  Cervical back: Normal range of motion.  Neurological:Ox3 .CN exam intact. UE grossly 5/5 with trace to 1+ DTR's. RLE 4/5 prox to distal with pain inhibition. LLE: 2+ to 3+ prox to distal with more pain inhibition. Sensation to LT/temperature decreased in LLE up to nearly umbilicus. DTR's in LE surprisingly brisk at 2+ patella/achilles.  Multiple spasms of LLE seen (says worse on oral prednisone) Skin: laceration right index finger, abrasions healing well on left knee,  L lateral malleolus dime sized- pink, a little red around wound- ~ 2-91mm deep; little slough . Buttocks slightly red, moist, no breakdown.  Psychiatric:appropriate  Assessment/Plan: 1. Functional deficits secondary to lumbar spinal stenosis with neurogenic claudication which require 3+ hours per day of interdisciplinary therapy in a comprehensive inpatient rehab  setting.  Physiatrist is providing close team supervision and 24 hour management of active medical problems listed below.  Physiatrist and rehab team continue to assess barriers to discharge/monitor patient progress toward functional and medical goals  Care Tool:  Bathing    Body parts bathed by patient: Right arm, Left arm, Chest, Abdomen, Front perineal area, Right upper leg, Left upper leg, Right lower leg, Left lower leg, Face   Body parts bathed by helper: Buttocks     Bathing assist Assist Level: Minimal Assistance - Patient > 75%     Upper Body Dressing/Undressing Upper body dressing   What is the patient wearing?: Pull over shirt    Upper body assist Assist Level: Supervision/Verbal cueing    Lower Body Dressing/Undressing Lower body dressing      What is the patient wearing?: Incontinence brief, Pants     Lower body assist Assist for lower body dressing: Total Assistance - Patient < 25%     Toileting Toileting    Toileting assist Assist for toileting: Maximal Assistance - Patient 25 - 49%     Transfers Chair/bed transfer  Transfers assist     Chair/bed transfer assist level: Dependent - mechanical lift(stedy)     Locomotion Ambulation   Ambulation assist   Ambulation activity did not occur: Safety/medical concerns          Walk 10 feet activity   Assist  Walk 10 feet activity did not occur: Safety/medical concerns        Walk 50 feet activity  Assist Walk 50 feet with 2 turns activity did not occur: Safety/medical concerns         Walk 150 feet activity   Assist Walk 150 feet activity did not occur: Safety/medical concerns         Walk 10 feet on uneven surface  activity   Assist Walk 10 feet on uneven surfaces activity did not occur: Safety/medical concerns         Wheelchair     Assist Will patient use wheelchair at discharge?: Yes Type of Wheelchair: Manual    Wheelchair assist level:  Supervision/Verbal cueing Max wheelchair distance: 100'    Wheelchair 50 feet with 2 turns activity    Assist        Assist Level: Supervision/Verbal cueing   Wheelchair 150 feet activity     Assist      Assist Level: Supervision/Verbal cueing   Blood pressure (!) 146/69, pulse 64, temperature 97.6 F (36.4 C), temperature source Oral, resp. rate 20, height 5\' 7"  (1.702 m), weight 98.4 kg, SpO2 97 %.    Medical Problem List and Plan: 1.Decreased functional ability due to lumbar stenosis/radiculopathy with neurogenic claudication. MRI's also suggest (albeit with motion artifact) distal thoracic involvement as well.  Exam is more c/w this too.              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             -consider re-imaging of thoracic spine (perhaps with sedation) to better look at thoracic cord.  4/13- will give pt PRAFOs B/L to prevent heel/ankle skin breakdown at night             -Continue CIR PT, OT -patient may shower -ELOS/Goals: 14-18 days, supervision to min assist with PT,OT 2. Antithrombotics: -DVT/anticoagulation:Lovenox. Check vascular study  4/12- Dopplers (-)- con't lovenox -antiplatelet therapy: Aspirin 81 mg daily 3. Pain Management:Baclofen 15 mg 3 times daily, Neurontin 100 mg nightly, OxyContin 10 mg every 12 hours, hydrocodone as needed as well as Imitrex for headaches -adjust regimen as needed based on efficacy, wean as possible             -oral steroids to taper  -pain present in therapy yesterday and at rest this morning at left ankle wound site, wound assessed and healing well. Has not been taking hydrocodone; one dose administered this morning.   4/13- will add Zanaflex 2 mg TID for  spasticity  4/14- spasticity improved per pt 4. Mood:Provide emotional support -antipsychotic agents: N/A 5. Neuropsych: This patientiscapable of making decisions on hisown behalf. 6. Skin/Wound Care:place steristrips over right index finger lac.                          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                           -foam dressing left knee 7. Fluids/Electrolytes/Nutrition:Routine in and outs with follow-up chemistries. Diet changed to regular as per patient's preference. He states he would  not follow current diet at home as is unappealing to him. I discussed that our goal is to simulate home environment as medications are adjusted according to current labs/vitals. I discussed that goal of heart healthy diet is to decrease sodium and saturated fat to protect the heart and circulatory system. Discussed his current diet and advised that he cut back on sugary juices, which he likes. Recommended replacing with water. He asked about his [employer] which he and his wife drink a lot of at home. Advised that despite it having no calories, it has a lot of processed ingredients and his goal should be to minimize processed foods. He expressed understanding. Continue nutritional counseling.  8. Chronic restless leg syndrome. Sinemet 10-100 mg twice daily. 9. 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 OSA with CPAP/2 L chronic oxygen at home. Continue inhalers 11. 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 Follow-up outpatient 12. Diabetes mellitus. Hemoglobin A1c 5.8. SSI. Changed to [facility] HS for CBG and SSI 13. Neurogenic bowel and bladder. Check PVR  4/12- no BM since Friday- if no  BM overnight, will give Mg citrate and enema tomorrow.   4/13- had extra large BM last night- con't regimen 14. Hypothyroidism. Synthroid 15. Chronic diastolic congestive heart failure. Monitor for any signs of fluid overload. Check regular weights   Filed Weights   09/26/19 1448 10/01/19 0440  Weight: 97.5 kg 98.4 kg   4/13- will monitor for trends  4/14- up 0.9 kg in 1 day- if goes up again, will check BNP 16. Hyperlipidemia. Zocor 17. Code status: Changed to DNR as per patient's wishes. 18. Leukocytosis   4/12- on prednisone taper- the cause since afebrile and no Sx's of illness-con't steroid taper 19. Wheezing  4/13- asked pt to ask for prn albuterol and added Flonase for allergies/cough  4/14- will add Albuterol nebs qam in addition to prn, since sounds wheezing every AM.  LOS: 5 days A FACE TO FACE EVALUATION WAS PERFORMED  [name] 10/01/2019, 8:55 AM

## 2019-10-01 NOTE — Progress Notes (Signed)
Occupational Therapy Session Note  Patient Details  Name: [name] MRN: [id number] Date of Birth: 1941/04/20  Today's Date: 10/01/2019 OT Individual Time: 1330-1445 OT Individual Time Calculation (min): 75 min    Short Term Goals: Week 1:  OT Short Term Goal 1 (Week 1): patient will complete bed mobility - rolling and supine to/from sitting with CS OT Short Term Goal 2 (Week 1): patient will complete functional transfers with min A using LRAD OT Short Term Goal 3 (Week 1): patient will complete bathing in shower with min A using assistive devices, shower bench and grab bars OT Short Term Goal 4 (Week 1): patient will complete LB dressing with mod A OT Short Term Goal 5 (Week 1): patient will complete toileting with mod A  Skilled Therapeutic Interventions/Progress Updates:    1:1. Pt received in bed agreeable to OT. Pt with intermittent pain in back during session with rest provided PRN. Pt completes Wii bowling activity in sitting and stands for 1 sit to stand after every frame with min-mod A overall. Pt completes alternating LE lifts in standing during sit to stand trials. Pt requests OT to lower RW back to original position stating, "I know you all think its wrong but it feels right." Pt requires mod VC for hand palcement on chair and RW for all standing trials. Pt progresses after bowling to complete 4 standing toe taps on 2" step with MIN A for balance. Pt taken to outside courtyard for fresh air, mood support and engages in conversation about personal goals. Pt states he wants to be able to get outside on front porch and negotiate 1 1" threshold to navigate on RW. Edu re putting small ramp on theshold to make it w/c accessible as well. Exited session with pt seated in bed in bed, exited session with p tseate din w/c, exit alarm on and call light in reach.  Therapy Documentation Precautions:  Precautions Precautions: Fall Restrictions Weight Bearing Restrictions:  No General: General PT Missed Treatment Reason: Unavailable (Comment);Nursing care(RN care and breathing treatment with RT) Vital Signs:  Pain:   ADL: ADL Eating: Set up Where Assessed-Eating: Chair Grooming: Setup Where Assessed-Grooming: Sitting at sink Upper Body Bathing: Minimal assistance Where Assessed-Upper Body Bathing: Sitting at sink Lower Body Bathing: Maximal assistance Where Assessed-Lower Body Bathing: Bed level Upper Body Dressing: Minimal assistance Where Assessed-Upper Body Dressing: Sitting at sink Lower Body Dressing: Maximal assistance Where Assessed-Lower Body Dressing: Bed level Toileting: Maximal assistance Where Assessed-Toileting: Bed level ADL Comments: no urge to void during OT sessions, condom cath in am, brief wet and changed bed level during pm session Vision   Perception    Praxis   Exercises:   Other Treatments:     Therapy/Group: Individual Therapy  Tonny Branch 10/01/2019, 2:44 PM

## 2019-10-01 NOTE — Progress Notes (Signed)
Pt states he can place himself on CPAP with RN's help. Advised pt to notify for RT if any assistance needed. RT will continue to monitor.

## 2019-10-01 NOTE — Plan of Care (Signed)
  Problem: RH SAFETY Goal: RH STG ADHERE TO SAFETY PRECAUTIONS W/ASSISTANCE/DEVICE Description: STG Adhere to Safety Precautions With mod I Assistance/Device. Outcome: Progressing   Problem: RH PAIN MANAGEMENT Goal: RH STG PAIN MANAGED AT OR BELOW PT'S PAIN GOAL Description: < 4 Outcome: Progressing   Problem: RH SKIN INTEGRITY Goal: RH STG SKIN FREE OF INFECTION/BREAKDOWN Description: Patients skin will remain free from further infection or breakdown with mod assist. Outcome: Progressing Goal: RH STG MAINTAIN SKIN INTEGRITY WITH ASSISTANCE Description: STG Maintain Skin Integrity With mod Assistance. Outcome: Progressing Goal: RH STG ABLE TO PERFORM INCISION/WOUND CARE W/ASSISTANCE Description: STG Able To Perform Incision/Wound Care With total Assistance from caregiver. Outcome: Progressing

## 2019-10-01 NOTE — Progress Notes (Signed)
Physical Therapy Session Note  Patient Details  Name: [name] MRN: [id number] Date of Birth: Nov 06, 1940  Today's Date: 10/01/2019 PT Individual Time: 0810-0900 PT Individual Time Calculation (min): 50 min  PT Missed Time: 10 min Missed Time Reason: RN care and receiving breathing treatment with RT  Short Term Goals: Week 1:  PT Short Term Goal 1 (Week 1): Pt will perform sit<>stand transfers with CGA PT Short Term Goal 2 (Week 1): Pt will perform stand pivot transfers using RW with mod assist PT Short Term Goal 3 (Week 1): Pt will ambulate at least 31ft using RW with mod assist of 1 (+2 w/c follow if needed)  Skilled Therapeutic Interventions/Progress Updates:    Pt received seated in bed receiving AM medications from RN and having a breathing treatment with assist from RT. This therapist returned after 10 min to allow RN and RT to complete their treatments. Pt then agreeable to participate in therapy session. Minor complaints of pain in LLE, not rated. Seated in bed to sitting EOB with CGA. Pt is dependent to don BLE TEDs. Pt is max A to don pants from EOB with mod A to stand to stedy. Stedy transfer to w/c. Sit to stand x 5 reps in // bars with min to mod A with B knees blocked. Pt is able to take several steps forwards/backwards in // bars with mod A for balance and stance LE knee blocked. Pt then able to ambulate x 5 ft in // bars with close w/c follow before onset of fatigue. Pt exhibits decreased B step length and decreased BLE clearance (scoots his feet vs taking a step) with each knee blocked in stance phase. Pt fatigues quickly with standing activity, remains motivated for participation though. Assisted pt back to room via w/c. Pt left seated in w/c in room with needs in reach, quick release belt and chair alarm in place at end of session.  Therapy Documentation Precautions:  Precautions Precautions: Fall Restrictions Weight Bearing Restrictions: No    Therapy/Group:  Individual Therapy   Excell Seltzer, PT, DPT  10/01/2019, 12:44 PM

## 2019-10-01 NOTE — Progress Notes (Signed)
Occupational Therapy Session Note  Patient Details  Name: [name] MRN: [id number] Date of Birth: 1940/10/23  Today's Date: 10/01/2019 OT Individual Time: 1000-1100 OT Individual Time Calculation (min): 60 min    Short Term Goals: Week 1:  OT Short Term Goal 1 (Week 1): patient will complete bed mobility - rolling and supine to/from sitting with CS OT Short Term Goal 2 (Week 1): patient will complete functional transfers with min A using LRAD OT Short Term Goal 3 (Week 1): patient will complete bathing in shower with min A using assistive devices, shower bench and grab bars OT Short Term Goal 4 (Week 1): patient will complete LB dressing with mod A OT Short Term Goal 5 (Week 1): patient will complete toileting with mod A  Skilled Therapeutic Interventions/Progress Updates:  Pt received seated in w/c agreeable to OT intervention. Pt requested to shave this session. Pt able to complete task with supervision- set- up assist from w/c at sink. Pt required MIN A for cleanliness. Pt transported to therapy gym with total A for time mgmt. Pt completed lateral scoot transfer from w/c>EOM with [place]. Pt complete sit<>stand therapeutic activity where pt instructed to stand from EOM with RW and retrieve clothespin pinned to netting of basketball hoop. Pt completedx 6 sit<>stands with RW and MIN A. Noted Pt with difficulty extending BUEs/BLEs in standing. Pt completed seated BUE therapeutic activity to facilitate core strength for higher level functional mobility via ball and trampoline. Pt able to toss ball against trampoline and maintain sitting balance with no difficulties. Trialed same task in standing with unable to static stand without BUE support. Pt completed similar lateral scoot transfer back to w/c with MIN A. Pt transported back to room with total A for time mgmt where pt left up in w/c with all needs within reach and alarm belt activated.  Therapy Documentation Precautions:   Precautions Precautions: Fall Restrictions Weight Bearing Restrictions: No General: Vital Signs:  Pain: Pt reports no pain during session.    Therapy/Group: Individual Therapy  [name] 10/01/2019, 3:51 PM

## 2019-10-02 ENCOUNTER — Inpatient Hospital Stay (HOSPITAL_COMMUNITY): Payer: Medicare Other | Admitting: Physical Therapy

## 2019-10-02 ENCOUNTER — Inpatient Hospital Stay (HOSPITAL_COMMUNITY): Payer: Medicare Other

## 2019-10-02 ENCOUNTER — Inpatient Hospital Stay (HOSPITAL_COMMUNITY): Payer: Medicare Other | Admitting: *Deleted

## 2019-10-02 MED ORDER — LORATADINE 10 MG PO TABS
10.0000 mg | ORAL_TABLET | Freq: Every day | ORAL | Status: DC
  Administered 2019-10-02 – 2019-10-06 (×5): 10 mg via ORAL
  Filled 2019-10-02 (×6): qty 1

## 2019-10-02 MED ORDER — DIAZEPAM 5 MG PO TABS
5.0000 mg | ORAL_TABLET | Freq: Two times a day (BID) | ORAL | Status: DC | PRN
  Administered 2019-10-02 – 2019-10-13 (×5): 5 mg via ORAL
  Filled 2019-10-02 (×6): qty 1

## 2019-10-02 NOTE — Progress Notes (Signed)
Occupational Therapy Session Note  Patient Details  Name: JAAZIAH PADGETT MRN: [id number] Date of Birth: [birth year]  Today's Date: 10/02/2019 OT Individual Time: [id number] Individual Time Calculation (min): 55 min    Short Term Goals: Week 1:  OT Short Term Goal 1 (Week 1): patient will complete bed mobility - rolling and supine to/from sitting with CS OT Short Term Goal 2 (Week 1): patient will complete functional transfers with min A using LRAD OT Short Term Goal 3 (Week 1): patient will complete bathing in shower with min A using assistive devices, shower bench and grab bars OT Short Term Goal 4 (Week 1): patient will complete LB dressing with mod A OT Short Term Goal 5 (Week 1): patient will complete toileting with mod A  Skilled Therapeutic Interventions/Progress Updates:    Pt resting in w/c upon arrival and agreeable to therapy.  OT intervention with focus on w/c mobility, functional transfers, sit<>stand, and standing balance to increase independence with BADLs. Pt propelled w/c 55' with supervision.  Pt performed scoot tranfser w/c>EOM with min A.  Sit<>stand X 5 with min A and using arm rest of chair placed in front. Pt unable to to fully extend knees with standing although knee extension progressively improved with repetition. Sit<>stand initially with heavy min A progressing to light min A for last attempt.  Pt required min A for squat/scoot tranfser back to w/c.  Pt returned to room and remained in w/c with belt alatm activated and all needs within reach.  Therapy Documentation Precautions:  Precautions Precautions: Fall Restrictions Weight Bearing Restrictions: No Pain: Pt c/o L ankle pain (unrated); RN aware   Therapy/Group: Individual Therapy  [name] 10/02/2019, 2:34 PM

## 2019-10-02 NOTE — Progress Notes (Signed)
Occupational Therapy Session Note  Patient Details  Name: [name] MRN: [id number] Date of Birth: Jun 12, 1941  Today's Date: 10/02/2019 OT Individual Time: 1000-1100 OT Individual Time Calculation (min): 60 min    Short Term Goals: Week 1:  OT Short Term Goal 1 (Week 1): patient will complete bed mobility - rolling and supine to/from sitting with CS OT Short Term Goal 2 (Week 1): patient will complete functional transfers with min A using LRAD OT Short Term Goal 3 (Week 1): patient will complete bathing in shower with min A using assistive devices, shower bench and grab bars OT Short Term Goal 4 (Week 1): patient will complete LB dressing with mod A OT Short Term Goal 5 (Week 1): patient will complete toileting with mod A  Skilled Therapeutic Interventions/Progress Updates:  Pt received seated in w/c reporting not having a great night but agreeable to OT intervention. Pt complete minimal w/c mobility into hallway with supervision however OTA to take over for time mgmt. Pt completed lateral scoot transfer to EOM with MODA. Session focus on standing trials with RW and BUE therex. Pt completed x7 sit<>stand from EOM with RW and MODA. Attempted functional stepping activity with pt unable to advance BLEs; however pt able to work on weight shiftng as precursor to higher level functional mobility. Pt only able to stand ~ 15 seconds each trial before needing to sit. Pt completed anterior weight shift therapeutic activity via bean bag toss to facilitate anterior weights as precursor to functional sit<>stands. Pt completed seated tricep therex via modified tricep dips to facilitate increased BUE for functional transfers. Pt complete x10 reps then reports urgently needing to void bowels. Pt required MOD A +2 to laterally scoot back to w/c. Pt completed sit<>stand to stedy with MODA for transfer to toilet, total A for clothing mgmt. Pt requesting more time to void bowels. RN and NT aware of pts location.    Therapy Documentation Precautions:  Precautions Precautions: Fall Restrictions Weight Bearing Restrictions: No General: General PT Missed Treatment Reason: Unavailable (Comment)(breathing treatment RT) Vital Signs:   Pain: Pt reports 2/10 pain in BLEs needing no pain medication intervention; however provided increased rest breaks as pain mgmt strategy.   Therapy/Group: Individual Therapy  [name] 10/02/2019, 11:34 AM

## 2019-10-02 NOTE — Progress Notes (Signed)
Physical Therapy Session Note  Patient Details  Name: [name] MRN: [id number] Date of Birth: [birth year]  Today's Date: 10/02/2019 PT Individual Time: [id number] PT Individual Time Calculation (min): 60 min  PT Missed Time: 15 min Missed Time Reason: Unavailable (receiving breathing treatment from RT)  Short Term Goals: Week 1:  PT Short Term Goal 1 (Week 1): Pt will perform sit<>stand transfers with CGA PT Short Term Goal 2 (Week 1): Pt will perform stand pivot transfers using RW with mod assist PT Short Term Goal 3 (Week 1): Pt will ambulate at least 37ft using RW with mod assist of 1 (+2 w/c follow if needed)  Skilled Therapeutic Interventions/Progress Updates:    Pt received seated in bed, RT present administering breathing treatment. Pt missed 15 min of scheduled therapy session due to breathing treatment. This therapist returned after breathing treatment complete and pt ready and agreeable to participate in therapy session. Seated in bed to sitting EOB with mod A for trunk control and some LE management. Pt reports not sleeping well due to LE spasms and feels very fatigued this AM, also reports increase in LLE pain (not rated, declines intervention). Sit to stand with max A to stedy from elevated bed. Stedy transfer to w/c. Pt is setup A for washing face and performing oral hygiene while seated in w/c at sink. Dependent transport via w/c to therapy gym for time conservation. Squat pivot transfer w/c to mat table with max A. Sit to stand from elevated mat table with therapist seated in front of patient with pt's BUE on chair armrests, mod A to stand. Transitioned to sit to stand to RW with max A progressing to mod A with cues for knee and hip extension. Pt able to tolerate standing x 30 sec maximum. Squat pivot transfer back to w/c with mod A. Manual w/c propulsion x 150 ft with use of BUE at Supervision level. Pt left seated in w/c in room with needs in reach, quick release belt and chair  alarm in place at end of session.  Therapy Documentation Precautions:  Precautions Precautions: Fall Restrictions Weight Bearing Restrictions: No    Therapy/Group: Individual Therapy   Excell Seltzer, PT, DPT  10/02/2019, 11:27 AM

## 2019-10-02 NOTE — Progress Notes (Signed)
RT placed pt on CPAP dream station for the night in auto titrate of max 12 min 6 w/no oxygen bled into the system. Pt is currently using his home CPAP mask w/dream station. Pt respiratory status is stable at this time. RT will continue to monitor.

## 2019-10-02 NOTE — Evaluation (Signed)
Recreational Therapy Assessment and Plan  Patient Details  Name: [name] MRN: [id number] Date of Birth: June 14, 1941 Today's Date: 10/02/2019  Rehab Potential:  Good ELOS:  d/c 4/24   Assessment Problem List:      Patient Active Problem List   Diagnosis Date Noted  . Cervical myelopathy ([place]) 09/26/2019  . Diabetes mellitus type 2, controlled, with complications ([place]) 07/19/4386  . Obese 09/17/2019  . Thoracic aortic aneurysm ([place]) 09/17/2019  . Bowel incontinence 09/17/2019  . Bladder incontinence 09/17/2019  . Right hemiparesis ([place]) 09/17/2019  . Acute renal failure (ARF) ([place]) 09/17/2019  . Left hemiparesis ([place]) 09/17/2019  . Chronic respiratory failure with hypoxia ([place]) 09/17/2019  . Syncope and collapse 09/17/2019  . Chronic diastolic CHF (congestive heart failure) ([place]) 09/04/2019  . PVD (peripheral vascular disease) ([place]) 02/14/2019  . Essential hypertension 02/13/2019  . Non-insulin treated type 2 diabetes mellitus ([place]) 02/13/2019  . Severe sepsis ([place]) 01/17/2019  . Community acquired pneumonia of right lower lobe of lung 01/17/2019  . Postural urinary incontinence 05/20/2018  . Myelopathy concurrent with and due to spinal stenosis of cervical region ([place]) 05/20/2018  . Myoclonic jerking 05/20/2018  . 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 History of respiratory failure 08/18/2017  . Hypokalemia 08/18/2017  . Leukocytosis 08/18/2017  . Muscle atrophy of lower extremity 08/09/2017  . Bowel and bladder incontinence 08/09/2017  . Right-sided muscle weakness 08/09/2017  . Neuropathy 08/09/2017  . 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/2017  . Abnormality of gait 07/12/2017  . Myoclonic jerkings, 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/2017  . Acquired right foot drop 07/12/2017  . UTI (urinary tract infection) due to Enterococcus 07/12/2017  . Pressure injury of skin 06/21/2017  . Acute cystitis 06/20/2017  . Generalized  weakness 06/20/2017  . Dehydration 06/20/2017  . Dyspnea 12/26/2016  . Chronic cough 12/26/2016  . Hypersomnia with sleep apnea 10/30/2016  . CSA (central sleep apnea) 10/30/2016  . Wheezing symptom 10/30/2016  . 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 RLS (restless legs syndrome) 08/25/2014  . Primary gout 08/25/2014  . OSA on CPAP 08/25/2014  . Severe obesity (BMI >= 40) ([place]) 08/25/2014  . Obesity (BMI 30-39.9) 02/18/2013  . 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 S/P TKR (total knee replacement) 11/29/2012  . History of thyroid cancer    Past Medical History:      Past Medical History:  Diagnosis Date  . Arthritis   . Barrett esophagus   . Bronchitis   . Carotid artery occlusion   . COPD (chronic obstructive pulmonary disease) ([place])   . Diabetes mellitus without complication ([place])   . GERD (gastroesophageal reflux disease)   . Headache    migraines  . History of thyroid cancer 2008  . Hypertension   . Restless leg   . Sleep apnea with use of continuous positive airway pressure (CPAP)    2011 [facility] , AHI 77cn central and obstrcutive. 16 cm water , 3 cm EPR.   . Thoracic ascending aortic aneurysm ([facility])    4.2 cm ascending TAA 08/18/17 CTA. Annual imaging recommended.  . thyroid ca dx'd 2009 or 2010   surg and radioactive isoptope  . Ulcer    Peptic ulcer disease   Past Surgical History:       Past Surgical History:  Procedure Laterality Date  . ANTERIOR CERVICAL DECOMP/DISCECTOMY FUSION N/A 09/03/2017   Procedure: ACDF - C3-C4 - C4-C5 -  C5-C6; Surgeon: Kary Kos, MD; Location: [facility]; Service: Neurosurgery; Laterality: N/A;  . CAROTID ENDARTERECTOMY Left January 03, 2006   Dr. Amedeo Plenty  . COLONOSCOPY    . EYE SURGERY Bilateral    cataract surgery with lens implants  . game keepers thumb Right   . JOINT REPLACEMENT Left    knee X 2  . ROTATOR CUFF REPAIR Right   . THYROIDECTOMY  2008   Assessment &  Plan  Clinical Impression: Patient is a 79 y.o. year right-handed male with history of chronic diastolic congestive heart failure, hypertension, diabetes mellitus, OSA on CPAP with 2 L of oxygen at home and quit smoking 14 years ago, chronic restless leg syndrome maintained on Sinemet, myoclonic jerking maintained on Keppra, thoracic ascending aortic aneurysm 4.2 cm 08/18/2017, thyroid cancer, obesity BMI 34.84, myelopathy of C-spine cord with cervical/lumbar radiculopathy and left hemiparesis/spasticity status post ACDF 09/03/2017 followed by Dr. Brett Fairy of neurology services as well as neurosurgery, myoclonic jerking, bowel bladder incontinence. Per chart review patient lives with spouse. 1 level home with ramped entrance. Needed assistance for dressing as well as transfers. He has a PCA who does provide assistance. Patient can ambulate short household distances with a walker. Presented 09/17/2019 with generalized fatigue over the last couple of days and decrease in his functional mobility. Cranial CT scan negative for acute changes. Admission chemistries BUN 43, creatinine 1.64, WBC 13,000, troponin negative, urinalysis negative nitrite with urine culture 30,000 diphtheroids, blood cultures no growth to date. MRI thoracic lumbar cervical spine showed chronic changes with patchy cord signal abnormality at the C3-4 level consistent with chronic myelomalacia. Multilevel degenerative disc disease of the lower thoracic spine without high-grade spinal stenosis as well as lumbar spondylosis and congenitally short pedicles causing prominent impingement at L2-3 and 3-4 with moderate impingement L1-2 and 4-5 as well as T12-L1 and L5-S1. Impingement of L1-2 and 2-3 minimally worsened from prior exam 07/24/2017. Neurosurgery Dr. Kristeen Miss consulted in regards to MRI findings patient not felt to be a surgical candidate advised IV Solu-Medrol then transition to prolonged prednisone with taper. Echocardiogram completed during  work-up shows ejection fraction of 60% no wall motion abnormalities. Chronic pain control ongoing with the use of scheduled baclofen titrated to 15 mg 3 times daily as well as nightly Neurontin and OxyContin 10 mg every 12 hours. Therapy evaluations completed and patient was admitted for a comprehensive rehab program. Patient transferred to CIR on 09/26/2019.  Pt presents with decreased activity tolerance, decreased functional mobility, decreased balance Limiting pt's independence with leisure/community pursuits.  Plan Min 1 TR session >20 minutes per week during LOS  Recommendations for other services: None   Discharge Criteria: Patient will be discharged from TR if patient refuses treatment 3 consecutive times without medical reason.  If treatment goals not met, if there is a change in medical status, if patient makes no progress towards goals or if patient is discharged from hospital.  The above assessment, treatment plan, treatment alternatives and goals were discussed and mutually agreed upon: by patient  [place] 10/02/2019, 3:35 PM

## 2019-10-02 NOTE — Progress Notes (Signed)
[facility] PHYSICAL MEDICINE & REHABILITATION PROGRESS NOTE   Spasticity was an issue again last night- woke him up ~ 4am- horrific pain in LLE- wouldn't stop spasming.  Also L foot and R foot kept falling over- no kickstands on PRAFOs to keep upright.  Bowels working OK- no constipation asking for miralax today.     ROS:   Pt denies SOB, abd pain, CP, N/V/C/D, and vision changes   Objective:   No results found. No results for input(s): WBC, HGB, HCT, PLT in the last 72 hours. No results for input(s): NA, K, CL, CO2, GLUCOSE, BUN, CREATININE, CALCIUM in the last 72 hours.  Intake/Output Summary (Last 24 hours) at 10/02/2019 0956 Last data filed at 10/02/2019 0901 Gross per 24 hour  Intake 740 ml  Output 1550 ml  Net -810 ml     Physical Exam: Vital Signs Blood pressure 139/66, pulse 77, temperature 97.7 F (36.5 C), temperature source Oral, resp. rate 20, height 5\' 7"  (1.702 m), weight 99.4 kg, SpO2 96 %.   Constitutional:awake, sitting up in bed; appropriate, wheezy sounding audibly- hasn't had Albuterol yet this AM, NAD HENT:  Conjugate gaze  Cardiovascular:RRR- no JVD Respiratory:coarse and wheezing throughout- adequate air movement, but very wheezy GI: soft, NT,  ND,  (+)BS Musculoskeletal:  Cervical back: Normal range of motion.  Neurological:Ox3 .CN exam intact. UE grossly 5/5 with trace to 1+ DTR's. RLE 4/5 prox to distal with pain inhibition. LLE: 2+ to 3+ prox to distal with more pain inhibition. Sensation to LT/temperature decreased in LLE up to nearly umbilicus. DTR's in LE surprisingly brisk at 2+ patella/achilles.  Multiple spasms of LLE seen (says worse on oral prednisone) Skin: laceration right index finger, abrasions healing well on left knee,  L lateral malleolus dime sized- pink, a little red around wound- ~ 2-21mm deep; little slough A little TTP over L lateral ankle- even with touching PRAFO that wasn't tightened up velcro wise/correctly-  .  Buttocks slightly red, moist, no breakdown.  Psychiatric:appropriate  Assessment/Plan: 1. Functional deficits secondary to lumbar spinal stenosis with neurogenic claudication which require 3+ hours per day of interdisciplinary therapy in a comprehensive inpatient rehab setting.  Physiatrist is providing close team supervision and 24 hour management of active medical problems listed below.  Physiatrist and rehab team continue to assess barriers to discharge/monitor patient progress toward functional and medical goals  Care Tool:  Bathing    Body parts bathed by patient: Right arm, Left arm, Chest, Abdomen, Front perineal area, Right upper leg, Left upper leg, Right lower leg, Left lower leg, Face   Body parts bathed by helper: Buttocks     Bathing assist Assist Level: Minimal Assistance - Patient > 75%     Upper Body Dressing/Undressing Upper body dressing   What is the patient wearing?: Pull over shirt    Upper body assist Assist Level: Supervision/Verbal cueing    Lower Body Dressing/Undressing Lower body dressing      What is the patient wearing?: Incontinence brief, Pants     Lower body assist Assist for lower body dressing: Total Assistance - Patient < 25%     Toileting Toileting    Toileting assist Assist for toileting: Maximal Assistance - Patient 25 - 49%     Transfers Chair/bed transfer  Transfers assist     Chair/bed transfer assist level: Minimal Assistance - Patient > 75%(lateral scoot to EOM)     Locomotion Ambulation   Ambulation assist   Ambulation activity did not occur:  Safety/medical concerns  Assist level: 2 helpers Assistive device: Parallel bars Max distance: 5'   Walk 10 feet activity   Assist  Walk 10 feet activity did not occur: Safety/medical concerns        Walk 50 feet activity   Assist Walk 50 feet with 2 turns activity did not occur: Safety/medical concerns         Walk 150 feet activity   Assist Walk  150 feet activity did not occur: Safety/medical concerns         Walk 10 feet on uneven surface  activity   Assist Walk 10 feet on uneven surfaces activity did not occur: Safety/medical concerns         Wheelchair     Assist Will patient use wheelchair at discharge?: Yes Type of Wheelchair: Manual    Wheelchair assist level: Supervision/Verbal cueing Max wheelchair distance: 100'    Wheelchair 50 feet with 2 turns activity    Assist        Assist Level: Supervision/Verbal cueing   Wheelchair 150 feet activity     Assist      Assist Level: Supervision/Verbal cueing   Blood pressure 139/66, pulse 77, temperature 97.7 F (36.5 C), temperature source Oral, resp. rate 20, height 5\' 7"  (1.702 m), weight 99.4 kg, SpO2 96 %.    Medical Problem List and Plan: 1.Decreased functional ability due to lumbar stenosis/radiculopathy with neurogenic claudication. MRI's also suggest (albeit with motion artifact) distal thoracic involvement as well.  Exam is more c/w this too.              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             -consider re-imaging of thoracic spine (perhaps with sedation) to better look at thoracic cord.  4/13- will give pt PRAFOs B/L to prevent heel/ankle skin breakdown at night  4/15- ordered new PRAFOS with kickstands- since original ones didn't have them- they are usually standard.              -Continue CIR PT, OT -patient may shower -ELOS/Goals: 14-18 days, supervision to min assist with PT,OT 2. Antithrombotics: -DVT/anticoagulation:Lovenox. Check vascular study  4/12- Dopplers (-)- con't lovenox -antiplatelet therapy: Aspirin 81 mg daily 3. Pain Management:Baclofen 15 mg 3 times daily,  Neurontin 100 mg nightly, OxyContin 10 mg every 12 hours, hydrocodone as needed as well as Imitrex for headaches -adjust regimen as needed based on efficacy, wean as possible             -oral steroids to taper  -pain present in therapy yesterday and at rest this morning at left ankle wound site, wound assessed and healing well. Has not been taking hydrocodone; one dose administered this morning.   4/13- will add Zanaflex 2 mg TID for spasticity  4/14- spasticity improved per pt  4/15- spasticity back to being a problem- will order Valium 5 mg BID prn for pt.  4. Mood:Provide emotional support -antipsychotic agents: N/A 5. Neuropsych: This patientiscapable of making decisions on hisown behalf. 6. Skin/Wound Care:place steristrips over right index finger lac.                          -continue wet to  dry to chronic left lateral malleolus wound (stage -unstagable) 80% sloigh per nursing note.[name]  4/14- pt L lateral malleolus healing- con't wound care; con't PRAFOs to get pressure off it.    4/15- ordered new PRAFOs to keep feet from turning out/rotating out                         -foam dressing left knee 7. Fluids/Electrolytes/Nutrition:Routine in and outs with follow-up chemistries. Diet changed to regular as per patient's preference. He states he would not follow current diet at home as is unappealing to him. I discussed that our goal is to simulate home environment as medications are adjusted according to current labs/vitals. I discussed that goal of heart healthy diet is to decrease sodium and saturated fat to protect the heart and circulatory system. Discussed his current diet and advised that he cut back on sugary juices, which he likes. Recommended replacing with water. He asked about his [employer] which he and his wife drink a lot of at home. Advised that despite it having no calories, it has a lot of processed ingredients and his goal should be to minimize  processed foods. He expressed understanding. Continue nutritional counseling.  8. Chronic restless leg syndrome. Sinemet 10-100 mg twice daily. 9. 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 OSA with CPAP/2 L chronic oxygen at home. Continue inhalers 11. 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 Follow-up outpatient 12. Diabetes mellitus. Hemoglobin A1c 5.8. SSI. Changed to [facility] HS for CBG and SSI 13. Neurogenic bowel and bladder. Check PVR  4/12- no BM since Friday- if no BM overnight, will give Mg citrate and enema tomorrow.   4/13- had extra large BM last night- con't regimen  4/15- pt says no constipation 14. Hypothyroidism. Synthroid 15. Chronic diastolic congestive heart failure. Monitor for any signs of fluid overload. Check regular weights   Filed Weights   09/26/19 1448 10/01/19 0440 10/02/19 0459  Weight: 97.5 kg 98.4 kg 99.4 kg   4/13- will monitor for trends  4/14- up 0.9 kg in 1 day- if goes up again, will check BNP  4/15- Weight up another 1 kg- will check BNP and labs tomorrow 16. Hyperlipidemia. Zocor 17. Code status: Changed to DNR as per patient's wishes. 18. Leukocytosis   4/12- on prednisone taper- the cause since afebrile and no Sx's of illness-con't steroid taper 19. Wheezing  4/13- asked pt to ask for prn albuterol and added Flonase for allergies/cough  4/14- will add Albuterol nebs qam in addition to prn, since sounds wheezing every AM.  4/15- still wheezing- hadn't gotten albuterol yet this AM- will add allergy meds to see if helps   LOS: 6 days A FACE TO FACE EVALUATION WAS PERFORMED  Leeba Barbe 10/02/2019, 9:56 AM

## 2019-10-02 NOTE — Progress Notes (Addendum)
Orthopedic Tech Progress Note Patient Details:  [name] [birth year] [id number] Reached out to [employer] (OUTSIDE VENDOR) to see if they could credit patient on BLE PRAFO's. MD ordered the wrong ones on 09/30/19, she wanted ones with STICK STAND and not the REHAB kind. Did not apply to patient do to him being in the restroom at that time. Ortho Devices Type of Ortho Device: Prafo boot/shoe Ortho Device/Splint Location: BLE Ortho Device/Splint Interventions: Ordered   Post Interventions Patient Tolerated: Well Instructions Provided: Care of device   Janit Pagan 10/02/2019, 11:17 AM

## 2019-10-03 ENCOUNTER — Inpatient Hospital Stay (HOSPITAL_COMMUNITY): Payer: Medicare Other

## 2019-10-03 ENCOUNTER — Inpatient Hospital Stay (HOSPITAL_COMMUNITY): Payer: Medicare Other | Admitting: Physical Therapy

## 2019-10-03 LAB — CBC WITH DIFFERENTIAL/PLATELET
Abs Immature Granulocytes: 0.15 10*3/uL — ABNORMAL HIGH (ref 0.00–0.07)
Basophils Absolute: 0 10*3/uL (ref 0.0–0.1)
Basophils Relative: 0 %
Eosinophils Absolute: 0.2 10*3/uL (ref 0.0–0.5)
Eosinophils Relative: 2 %
HCT: 41.6 % (ref 39.0–52.0)
Hemoglobin: 13.1 g/dL (ref 13.0–17.0)
Immature Granulocytes: 1 %
Lymphocytes Relative: 12 %
Lymphs Abs: 1.4 10*3/uL (ref 0.7–4.0)
MCH: 29.5 pg (ref 26.0–34.0)
MCHC: 31.5 g/dL (ref 30.0–36.0)
MCV: 93.7 fL (ref 80.0–100.0)
Monocytes Absolute: 1.2 10*3/uL — ABNORMAL HIGH (ref 0.1–1.0)
Monocytes Relative: 10 %
Neutro Abs: 8.9 10*3/uL — ABNORMAL HIGH (ref 1.7–7.7)
Neutrophils Relative %: 75 %
Platelets: 178 10*3/uL (ref 150–400)
RBC: 4.44 MIL/uL (ref 4.22–5.81)
RDW: 15.4 % (ref 11.5–15.5)
WBC: 11.8 10*3/uL — ABNORMAL HIGH (ref 4.0–10.5)
nRBC: 0 % (ref 0.0–0.2)

## 2019-10-03 LAB — BASIC METABOLIC PANEL
Anion gap: 10 (ref 5–15)
BUN: 20 mg/dL (ref 8–23)
CO2: 33 mmol/L — ABNORMAL HIGH (ref 22–32)
Calcium: 9 mg/dL (ref 8.9–10.3)
Chloride: 99 mmol/L (ref 98–111)
Creatinine, Ser: 0.93 mg/dL (ref 0.61–1.24)
GFR calc Af Amer: >60 mL/min (ref >60)
GFR calc non Af Amer: >60 mL/min (ref >60)
Glucose, Bld: 96 mg/dL (ref 70–99)
Potassium: 4.9 mmol/L (ref 3.5–5.1)
Sodium: 142 mmol/L (ref 135–145)

## 2019-10-03 LAB — BRAIN NATRIURETIC PEPTIDE: B Natriuretic Peptide: 45.4 pg/mL (ref 0.0–100.0)

## 2019-10-03 NOTE — Progress Notes (Signed)
Physical Therapy Weekly Progress Note  Patient Details  Name: [name] MRN: [id number] Date of Birth: 1941/05/17  Beginning of progress report period: September 27, 2019 End of progress report period: October 03, 2019  Today's Date: 10/03/2019 PT Individual Time: 1100-1200 PT Individual Time Calculation (min): 60 min   Patient has met 0 of 3 short term goals.  Pt has made slow progress over the past week and was limited in his progress the past few days due to not sleeping well from ongoing LE spasms. Pt ranged from CGA to mod A for bed mobility and from min A to max A for sit to stand to RW and squat pivot transfers. Pt was able to ambulate x 5 ft in // bars with assist x 2 before onset of fatigue. Pt is at Supervision level for w/c mobility. Pt will need to show continued improvement in order to d/c home safely within the next week.  Patient continues to demonstrate the following deficits muscle weakness, decreased cardiorespiratoy endurance and decreased standing balance, decreased postural control and decreased balance strategies and therefore will continue to benefit from skilled PT intervention to increase functional independence with mobility.  Patient progressing toward long term goals..  Continue plan of care.  PT Short Term Goals Week 1:  PT Short Term Goal 1 (Week 1): Pt will perform sit<>stand transfers with CGA PT Short Term Goal 1 - Progress (Week 1): Not met PT Short Term Goal 2 (Week 1): Pt will perform stand pivot transfers using RW with mod assist PT Short Term Goal 2 - Progress (Week 1): Not met PT Short Term Goal 3 (Week 1): Pt will ambulate at least 76f using RW with mod assist of 1 (+2 w/c follow if needed) PT Short Term Goal 3 - Progress (Week 1): Not met Week 2:  PT Short Term Goal 1 (Week 2): Pt will complete least restrictive transfer with mod A consistently PT Short Term Goal 2 (Week 2): Pt will perform sit to stand wtih mod A consistently PT Short Term Goal 3  (Week 2): Pt will ambulate x 10 ft with LRAD  Skilled Therapeutic Interventions/Progress Updates:    Pt received seated in w/c in room, agreeable to PT session. Pt reports 3/10 pain in L lateral ankle at wound site, declines intervention. Pt reports sleeping better last night with decreased LE spasms but does appear to still be fatigued this date. Dependent transport via w/c to therapy gym for time conservation. Squat pivot transfer w/c to mat table with max A with increased time and cueing needed. Sit to stand x 3 reps from elevated mat table to RW with max A. Pt unable to stand fully upright this date and has trouble with hip and knee extension. Pt requires increased assist needed for sit to stands this date. Seated balance/endurance task performing ball toss against rebounder with min A for dynamic sitting balance, 2 x 30 reps to fatigue. Seated ball toss progressing to volley ball toss with close SBA for sitting balance reaching outside BOS and returning to midline. Squat pivot transfer back to w/c with max A. Seated BLE strengthening therex: marches, LAQ, heel/toe raises x 10 reps each. Manual w/c propulsion x 150 ft with use of BUE at Supervision level for global endurance training. Pt left seated in w/c in room with needs in reach, quick release belt and chair alarm in place at end of session.  Therapy Documentation Precautions:  Precautions Precautions: Fall Restrictions Weight Bearing Restrictions: No  Therapy/Group: Individual Therapy   Excell Seltzer, PT, DPT  10/03/2019, 12:49 PM

## 2019-10-03 NOTE — Progress Notes (Signed)
Occupational Therapy Session Note  Patient Details  Name: [name] MRN: [id number] Date of Birth: [birth year]  Today's Date: 10/03/2019 OT Individual Time: 1224-4975 OT Individual Time Calculation (min): 86 min    Short Term Goals: Week 1:  OT Short Term Goal 1 (Week 1): patient will complete bed mobility - rolling and supine to/from sitting with CS OT Short Term Goal 1 - Progress (Week 1): Met OT Short Term Goal 2 (Week 1): patient will complete functional transfers with min A using LRAD OT Short Term Goal 2 - Progress (Week 1): Progressing toward goal OT Short Term Goal 3 (Week 1): patient will complete bathing in shower with min A using assistive devices, shower bench and grab bars OT Short Term Goal 3 - Progress (Week 1): Met OT Short Term Goal 4 (Week 1): patient will complete LB dressing with mod A OT Short Term Goal 4 - Progress (Week 1): Progressing toward goal OT Short Term Goal 5 (Week 1): patient will complete toileting with mod A OT Short Term Goal 5 - Progress (Week 1): Progressing toward goal  Skilled Therapeutic Interventions/Progress Updates:  Pt received supine in bed asleep upon OTA arrival. Pt reports continued spasms in BLEs causing 3/10 pain. Pt complete bed mobility with MOD A needing assist to advance BLEs to EOB and elevate trunk into sitting. Pt sit<>stand from EOB to stedy with MODA. Pt requesting to void bowels once in stedy; transported pt to toilet with total A. Pt required increased time to void bowels. Total A for pericare in standing. Pt only able to tolerate standing ~ 10 seconds therefore required increased time to complete full posterior pericare. Pt transported back to bed to don new brief. MOD A to roll in bed to don new brief and pull pants up to waist line. Pt requesting to stay up in w/c at end of session. Utilized stedy with pt needing MOD A for additional sit<>stand. Donned TED hose and shoes from w/c with total A. Pt noted to have labored breathing  during session noting episodes of flushed face. Pt reports that's his normal in the mornings, however RN aware.  Pt left seated in w/c with alarm belt activated and all needs within reach.   Therapy Documentation Precautions:  Precautions Precautions: Fall Restrictions Weight Bearing Restrictions: No General:   Vital Signs:  Pain: Pt reports 3/10 pain in BLEs; offered increased rest breaks and deep breathing as pain mgmt strategy.   Therapy/Group: Individual Therapy  [name] 10/03/2019, 12:28 PM

## 2019-10-03 NOTE — Progress Notes (Signed)
[place] [facility] PHYSICAL MEDICINE & REHABILITATION PROGRESS NOTE   Pt reports slept later this AM due to improved spasticity- still having a few spasms after valium, but overall better.   Slept yesterday afternoon- , but doesn't feel sleepy this AM Said PRAFOs are rubbing on L ankle- foot still flipped out- but sleeps on back and legs frogged out. So no way for PRAFOs to NOT rub.   WBC down to 11.8- from 13.3  ROS:   Pt denies SOB, abd pain, CP, N/V/C/D, and vision changes   Objective:   No results found. Recent Labs    10/03/19 0557  WBC 11.8*  HGB 13.1  HCT 41.6  PLT 178   Recent Labs    10/03/19 0557  NA 142  K 4.9  CL 99  CO2 33*  GLUCOSE 96  BUN 20  CREATININE 0.93  CALCIUM 9.0    Intake/Output Summary (Last 24 hours) at 10/03/2019 0907 Last data filed at 10/02/2019 1859 Gross per 24 hour  Intake 450 ml  Output 500 ml  Net -50 ml     Physical Exam: Vital Signs Blood pressure (!) 146/74, pulse 66, temperature 98.2 F (36.8 C), resp. rate 16, height 5\' 7"  (1.702 m), weight 99.4 kg, SpO2 95 %.   Constitutional:awake, alert, appropriate, sitting up in bed; legs frogged splayed in front of him, NAD HENT:  Conjugate gaze  Cardiovascular:RRR Respiratory:coarse and wheezy- good air movement [id number], NT, ND,  (+)BS Musculoskeletal:  Cervical back: Normal range of motion.  Neurological:Ox3 .CN exam intact. UE grossly 5/5 with trace to 1+ DTR's. RLE 4/5 prox to distal with pain inhibition. LLE: 2+ to 3+ prox to distal with more pain inhibition. Sensation to LT/temperature decreased in LLE up to nearly umbilicus. DTR's in LE surprisingly brisk at 2+ patella/achilles.  Multiple spasms of LLE seen (says worse on oral prednisone) Skin: laceration right index finger, abrasions healing well on left knee,  L lateral malleolus dime sized- pink, a little red around wound- ~ 2-76mm deep; little slough- no change on exam PRAFOs- in place, with kickstand out- but  still tilted externally . Buttocks slightly red, moist, no breakdown.  Psychiatric:appropriate  Assessment/Plan: 1. Functional deficits secondary to lumbar spinal stenosis with neurogenic claudication which require 3+ hours per day of interdisciplinary therapy in a comprehensive inpatient rehab setting.  Physiatrist is providing close team supervision and 24 hour management of active medical problems listed below.  Physiatrist and rehab team continue to assess barriers to discharge/monitor patient progress toward functional and medical goals  Care Tool:  Bathing    Body parts bathed by patient: Right arm, Left arm, Chest, Abdomen, Front perineal area, Right upper leg, Left upper leg, Right lower leg, Left lower leg, Face   Body parts bathed by helper: Buttocks     Bathing assist Assist Level: Minimal Assistance - Patient > 75%     Upper Body Dressing/Undressing Upper body dressing   What is the patient wearing?: Pull over shirt    Upper body assist Assist Level: Supervision/Verbal cueing    Lower Body Dressing/Undressing Lower body dressing      What is the patient wearing?: Incontinence brief, Pants     Lower body assist Assist for lower body dressing: Total Assistance - Patient < 25%     Toileting Toileting    Toileting assist Assist for toileting: Dependent - Patient 0%     Transfers Chair/bed transfer  Transfers assist     Chair/bed transfer assist level: Maximal Assistance -  Patient 25 - 49%     Locomotion Ambulation   Ambulation assist   Ambulation activity did not occur: Safety/medical concerns  Assist level: 2 helpers Assistive device: Parallel bars Max distance: 5'   Walk 10 feet activity   Assist  Walk 10 feet activity did not occur: Safety/medical concerns        Walk 50 feet activity   Assist Walk 50 feet with 2 turns activity did not occur: Safety/medical concerns         Walk 150 feet activity   Assist Walk 150 feet  activity did not occur: Safety/medical concerns         Walk 10 feet on uneven surface  activity   Assist Walk 10 feet on uneven surfaces activity did not occur: Safety/medical concerns         Wheelchair     Assist Will patient use wheelchair at discharge?: Yes Type of Wheelchair: Manual    Wheelchair assist level: Supervision/Verbal cueing Max wheelchair distance: 150'    Wheelchair 50 feet with 2 turns activity    Assist        Assist Level: Supervision/Verbal cueing   Wheelchair 150 feet activity     Assist      Assist Level: Supervision/Verbal cueing   Blood pressure (!) 146/74, pulse 66, temperature 98.2 F (36.8 C), resp. rate 16, height 5\' 7"  (1.702 m), weight 99.4 kg, SpO2 95 %.    Medical Problem List and Plan: 1.Decreased functional ability due to lumbar stenosis/radiculopathy with neurogenic claudication. MRI's also suggest (albeit with motion artifact) distal thoracic involvement as well.  Exam is more c/w this too.              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             -consider re-imaging of thoracic spine (perhaps with sedation) to better look at thoracic cord.  4/13- will give pt PRAFOs B/L to prevent heel/ankle skin breakdown at night  4/15- ordered new PRAFOS with kickstands- since original ones didn't have them- they are usually standard.   4/16- PRAFOs with kickstands came- not helping because pt sits with hips externally rotated             -Continue CIR PT, OT -patient may shower -ELOS/Goals: 14-18 days, supervision to min assist with PT,OT 2. Antithrombotics: -DVT/anticoagulation:Lovenox. Check vascular study  4/12- Dopplers (-)- con't lovenox -antiplatelet therapy: Aspirin 81  mg daily 3. Pain Management:Baclofen 15 mg 3 times daily, Neurontin 100 mg nightly, OxyContin 10 mg every 12 hours, hydrocodone as needed as well as Imitrex for headaches -adjust regimen as needed based on efficacy, wean as possible             -oral steroids to taper  -pain present in therapy yesterday and at rest this morning at left ankle wound site, wound assessed and healing well. Has not been taking hydrocodone; one dose administered this morning.   4/13- will add Zanaflex 2 mg TID for spasticity  4/14- spasticity improved per pt  4/15- spasticity back to being a problem- will order Valium 5 mg BID prn for pt.   4/16- was helpful, doesn't resolve Sx's completely 4. Mood:Provide emotional support -antipsychotic agents: N/A 5. Neuropsych: This patientiscapable of  making decisions on hisown behalf. 6. Skin/Wound Care:place steristrips over right index finger lac.                          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    4/15- ordered new PRAFOs to keep feet from turning out/rotating out  4/16- PRAFOs came with kickstands                         -foam dressing left knee 7. Fluids/Electrolytes/Nutrition:Routine in and outs with follow-up chemistries. Diet changed to regular as per patient's preference. He states he would not follow current diet at home as is unappealing to him. I discussed that our goal is to simulate home environment as medications are adjusted according to current labs/vitals. I discussed that goal of heart healthy diet is to decrease sodium and saturated fat to protect the heart and circulatory system. Discussed his current diet and advised that he cut back on sugary juices, which he likes. Recommended replacing with water. He asked about his [employer] which he and his  wife drink a lot of at home. Advised that despite it having no calories, it has a lot of processed ingredients and his goal should be to minimize processed foods. He expressed understanding. Continue nutritional counseling.  8. Chronic restless leg syndrome. Sinemet 10-100 mg twice daily. 9. 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 OSA with CPAP/2 L chronic oxygen at home. Continue inhalers 11. 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 Follow-up outpatient 12. Diabetes mellitus. Hemoglobin A1c 5.8. SSI. Changed to [facility] HS for CBG and SSI 13. Neurogenic bowel and bladder. Check PVR  4/12- no BM since Friday- if no BM overnight, will give Mg citrate and enema tomorrow.   4/13- had extra large BM last night- con't regimen  4/15- pt says no constipation 14. Hypothyroidism. Synthroid 15. Chronic diastolic congestive heart failure. Monitor for any signs of fluid overload. Check regular weights   Filed Weights   09/26/19 1448 10/01/19 0440 10/02/19 0459  Weight: 97.5 kg 98.4 kg 99.4 kg   4/13- will monitor for trends  4/14- up 0.9 kg in 1 day- if goes up again, will check BNP  4/15- Weight up another 1 kg- will check BNP and labs tomorrow 16. Hyperlipidemia. Zocor 17. Code status: Changed to DNR as per patient's wishes. 18. Leukocytosis   4/12- on prednisone taper- the cause since afebrile and no Sx's of illness-con't steroid taper 19. Wheezing  4/13- asked pt to ask for prn albuterol and added Flonase for allergies/cough  4/14- will add Albuterol nebs qam in addition to prn, since sounds wheezing every AM.  4/15- still wheezing- hadn't gotten albuterol yet this AM- will add allergy meds to see if helps  4/16- still wheezy but better.   LOS: 7 days A FACE TO FACE EVALUATION WAS PERFORMED  Markcus Lazenby 10/03/2019, 9:07 AM

## 2019-10-03 NOTE — Progress Notes (Signed)
Occupational Therapy Weekly Progress Note  Patient Details  Name: [name] MRN: [id number] Date of Birth: [birth year]  Beginning of progress report period: September 27, 2019 End of progress report period: October 03, 2019  Patient has met 2 of 5 short term goals.  Pt is making slow but steady progress towards LTGs. Pt completes bathing at shower level with min A using AE PRN. Pt currently requires max A for LB dressing tasks using AE PRN. Pt currently requires min A for scoot transfers and mod A for squat pivot/stand pivot transfers. Pt requires BUE support when standing. Pt requires max A for toileting tasks.    Patient continues to demonstrate the following deficits: muscle weakness, decreased cardiorespiratoy endurance, decreased coordination and decreased standing balance, decreased postural control and decreased balance strategies and therefore will continue to benefit from skilled OT intervention to enhance overall performance with BADL and iADL.  Patient progressing toward long term goals..  Continue plan of care.  OT Short Term Goals Week 1:  OT Short Term Goal 1 (Week 1): patient will complete bed mobility - rolling and supine to/from sitting with CS OT Short Term Goal 1 - Progress (Week 1): Met OT Short Term Goal 2 (Week 1): patient will complete functional transfers with min A using LRAD OT Short Term Goal 2 - Progress (Week 1): Progressing toward goal OT Short Term Goal 3 (Week 1): patient will complete bathing in shower with min A using assistive devices, shower bench and grab bars OT Short Term Goal 3 - Progress (Week 1): Met OT Short Term Goal 4 (Week 1): patient will complete LB dressing with mod A OT Short Term Goal 4 - Progress (Week 1): Progressing toward goal OT Short Term Goal 5 (Week 1): patient will complete toileting with mod A OT Short Term Goal 5 - Progress (Week 1): Progressing toward goal Week 2:  OT Short Term Goal 1 (Week 2): STG=LTG secondary to ELOS   [name] 10/03/2019, 6:31 AM

## 2019-10-03 NOTE — Progress Notes (Signed)
Occupational Therapy Session Note  Patient Details  Name: [name] MRN: [id number] Date of Birth: 1941-03-08  Today's Date: 10/03/2019 OT Individual Time: 1300-1410 OT Individual Time Calculation (min): 70 min    Short Term Goals: Week 1:  OT Short Term Goal 1 (Week 1): patient will complete bed mobility - rolling and supine to/from sitting with CS OT Short Term Goal 1 - Progress (Week 1): Met OT Short Term Goal 2 (Week 1): patient will complete functional transfers with min A using LRAD OT Short Term Goal 2 - Progress (Week 1): Progressing toward goal OT Short Term Goal 3 (Week 1): patient will complete bathing in shower with min A using assistive devices, shower bench and grab bars OT Short Term Goal 3 - Progress (Week 1): Met OT Short Term Goal 4 (Week 1): patient will complete LB dressing with mod A OT Short Term Goal 4 - Progress (Week 1): Progressing toward goal OT Short Term Goal 5 (Week 1): patient will complete toileting with mod A OT Short Term Goal 5 - Progress (Week 1): Progressing toward goal  Skilled Therapeutic Interventions/Progress Updates:    Pt resting in w/c upon arrival.  OT intervention with focus on w/c mobility, sit<>stand, standing balance, and BUE therex to increase independence with BADLs. Pt performed scoot transfer to EOM with min A.  Sit<>stand using BUE to pull up and assist with powering up with BLE. Pt performed sit<>stand X 5 with min A.  Pt continues to be unable to extend B knees and stand completely upright.  Pt able to stand for approx 10 seconds. Pt reports that he has not slept well over the past two nights. Pt propelled w/c to small gym and engaged in BUE therex on [employer] (level 2 5 mins X 2 in opposite directions. Pt returned to room.  Discussed LTGs and progress.  Pt despondent that the past two days have not been "good." Encouragement offered. Pt remained in w/c with all needs within reach and belt alarm activated.   Therapy  Documentation Precautions:  Precautions Precautions: Fall Restrictions Weight Bearing Restrictions: No  Pain:  Pt reports no pain at the moment although he stated that his RLE "gave him a fit during the night" with spasms   Therapy/Group: Individual Therapy  [name] 10/03/2019, 2:23 PM

## 2019-10-04 ENCOUNTER — Inpatient Hospital Stay (HOSPITAL_COMMUNITY): Payer: Medicare Other | Admitting: Occupational Therapy

## 2019-10-04 MED ORDER — ACETAMINOPHEN 325 MG PO TABS
650.0000 mg | ORAL_TABLET | Freq: Four times a day (QID) | ORAL | Status: DC | PRN
  Administered 2019-10-04 – 2019-10-25 (×3): 650 mg via ORAL
  Filled 2019-10-04 (×4): qty 2

## 2019-10-04 NOTE — Progress Notes (Signed)
[place] PHYSICAL MEDICINE & REHABILITATION PROGRESS NOTE   Pt reports got nebs this AM at 8am- very helpful for breathing.  Sleeping a lot during day- doesn't want to increase Zanaflex or Baclofen at this time- valium helping "some" , but concerned about increasing meds due to sedation.   Slept better last night- bowels OK.   ROS:   Pt denies SOB, abd pain, CP, N/V/C/D, and vision changes   Objective:   No results found. Recent Labs    10/03/19 0557  WBC 11.8*  HGB 13.1  HCT 41.6  PLT 178   Recent Labs    10/03/19 0557  NA 142  K 4.9  CL 99  CO2 33*  GLUCOSE 96  BUN 20  CREATININE 0.93  CALCIUM 9.0    Intake/Output Summary (Last 24 hours) at 10/04/2019 1215 Last data filed at 10/04/2019 1056 Gross per 24 hour  Intake 1410 ml  Output 2200 ml  Net -790 ml     Physical Exam: Vital Signs Blood pressure 140/73, pulse 78, temperature (!) 97.5 F (36.4 C), temperature source Oral, resp. rate 18, height 5\' 7"  (1.702 m), weight 98.9 kg, SpO2 95 %.   Constitutional:awake, appropriate, receiving nebs, appears comfortable/no SOB NAD HENT:  Conjugate gaze  Cardiovascular: RRR Respiratory:much less coarse; no wheezing when listened after nebs- a coarse, junky cough, but less so, good air movement [id number], NT, ND, (+)BS Musculoskeletal:  Cervical back: Normal range of motion.  Neurological:Ox3 .CN exam intact. UE grossly 5/5 with trace to 1+ DTR's. RLE 4/5 prox to distal with pain inhibition. LLE: 2+ to 3+ prox to distal with more pain inhibition. Sensation to LT/temperature decreased in LLE up to nearly umbilicus. DTR's in LE surprisingly brisk at 2+ patella/achilles.  Multiple spasms seen esp of LLE- 2-3x when in room for 10 minutes- made him jump in pain Skin: laceration right index finger, abrasions healing well on left knee,  L lateral malleolus dime sized- pink, a little red around wound- ~ 2-90mm deep; little slough- no change on exam PRAFOs- in place,  with kickstand out- but still tilted externally . Buttocks slightly red, moist, no breakdown.  Psychiatric:appropriate  Assessment/Plan: 1. Functional deficits secondary to lumbar spinal stenosis with neurogenic claudication which require 3+ hours per day of interdisciplinary therapy in a comprehensive inpatient rehab setting.  Physiatrist is providing close team supervision and 24 hour management of active medical problems listed below.  Physiatrist and rehab team continue to assess barriers to discharge/monitor patient progress toward functional and medical goals  Care Tool:  Bathing    Body parts bathed by patient: Right arm, Left arm, Chest, Abdomen, Front perineal area, Right upper leg, Left upper leg, Right lower leg, Left lower leg, Face   Body parts bathed by helper: Buttocks     Bathing assist Assist Level: Minimal Assistance - Patient > 75%     Upper Body Dressing/Undressing Upper body dressing   What is the patient wearing?: Pull over shirt    Upper body assist Assist Level: Supervision/Verbal cueing    Lower Body Dressing/Undressing Lower body dressing      What is the patient wearing?: Incontinence brief, Pants     Lower body assist Assist for lower body dressing: Total Assistance - Patient < 25%     Toileting Toileting    Toileting assist Assist for toileting: Dependent - Patient 0%     Transfers Chair/bed transfer  Transfers assist     Chair/bed transfer assist level: Maximal Assistance -  Patient 25 - 49%     Locomotion Ambulation   Ambulation assist   Ambulation activity did not occur: Safety/medical concerns  Assist level: 2 helpers Assistive device: Parallel bars Max distance: 5'   Walk 10 feet activity   Assist  Walk 10 feet activity did not occur: Safety/medical concerns        Walk 50 feet activity   Assist Walk 50 feet with 2 turns activity did not occur: Safety/medical concerns         Walk 150 feet  activity   Assist Walk 150 feet activity did not occur: Safety/medical concerns         Walk 10 feet on uneven surface  activity   Assist Walk 10 feet on uneven surfaces activity did not occur: Safety/medical concerns         Wheelchair     Assist Will patient use wheelchair at discharge?: Yes Type of Wheelchair: Manual    Wheelchair assist level: Supervision/Verbal cueing Max wheelchair distance: 150'    Wheelchair 50 feet with 2 turns activity    Assist        Assist Level: Supervision/Verbal cueing   Wheelchair 150 feet activity     Assist      Assist Level: Supervision/Verbal cueing   Blood pressure 140/73, pulse 78, temperature (!) 97.5 F (36.4 C), temperature source Oral, resp. rate 18, height 5\' 7"  (1.702 m), weight 98.9 kg, SpO2 95 %.    Medical Problem List and Plan: 1.Decreased functional ability due to lumbar stenosis/radiculopathy with neurogenic claudication. MRI's also suggest (albeit with motion artifact) distal thoracic involvement as well.  Exam is more c/w this too.              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             -consider re-imaging of thoracic spine (perhaps with sedation) to better look at thoracic cord.  4/13- will give pt PRAFOs B/L to prevent heel/ankle skin breakdown at night  4/15- ordered new PRAFOS with kickstands- since original ones didn't have them- they are usually standard.   4/16- PRAFOs with kickstands came- not helping because pt sits with hips externally rotated             -Continue CIR PT, OT -patient may shower -ELOS/Goals: 14-18 days, supervision to min assist with PT,OT 2. Antithrombotics: -DVT/anticoagulation:Lovenox. Check vascular study  4/12- Dopplers (-)-  con't lovenox -antiplatelet therapy: Aspirin 81 mg daily 3. Pain Management:Baclofen 15 mg 3 times daily, Neurontin 100 mg nightly, OxyContin 10 mg every 12 hours, hydrocodone as needed as well as Imitrex for headaches -adjust regimen as needed based on efficacy, wean as possible             -oral steroids to taper  -pain present in therapy yesterday and at rest this morning at left ankle wound site, wound assessed and healing well. Has not been taking hydrocodone; one dose administered this morning.   4/13- will add Zanaflex 2 mg TID for spasticity  4/14- spasticity improved per pt  4/15- spasticity back to being a problem- will order Valium 5 mg BID prn for pt.   4/16- was helpful, doesn't resolve Sx's completely  4/17- Can't increase meds for spasticity- might be  candidate for Botox of LLE or ITB pump in long term.  4. Mood:Provide emotional support -antipsychotic agents: N/A 5. Neuropsych: This patientiscapable of making decisions on hisown behalf. 6. Skin/Wound Care:place steristrips over right index finger lac.                          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    4/15- ordered new PRAFOs to keep feet from turning out/rotating out  4/16- PRAFOs came with kickstands                         -foam dressing left knee 7. Fluids/Electrolytes/Nutrition:Routine in and outs with follow-up chemistries. Diet changed to regular as per patient's preference. He states he would not follow current diet at home as is unappealing to him. I discussed that our goal is to simulate home environment as medications are adjusted according to current labs/vitals. I discussed that goal of heart healthy diet is to decrease sodium and saturated fat to protect the heart and circulatory system.  Discussed his current diet and advised that he cut back on sugary juices, which he likes. Recommended replacing with water. He asked about his [employer] which he and his wife drink a lot of at home. Advised that despite it having no calories, it has a lot of processed ingredients and his goal should be to minimize processed foods. He expressed understanding. Continue nutritional counseling.  8. Chronic restless leg syndrome. Sinemet 10-100 mg twice daily. 9. 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 OSA with CPAP/2 L chronic oxygen at home. Continue inhalers 11. 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 Follow-up outpatient 12. Diabetes mellitus. Hemoglobin A1c 5.8. SSI. Changed to [facility] HS for CBG and SSI  4/17- pt refused to get BGs checked- stopped 4/12- A1c 5.8- 13. Neurogenic bowel and bladder. Check PVR  4/12- no BM since Friday- if no BM overnight, will give Mg citrate and enema tomorrow.   4/13- had extra large BM last night- con't regimen  4/15- pt says no constipation 14. Hypothyroidism. Synthroid 15. Chronic diastolic congestive heart failure. Monitor for any signs of fluid overload. Check regular weights   Filed Weights   10/01/19 0440 10/02/19 0459 10/04/19 0526  Weight: 98.4 kg 99.4 kg 98.9 kg   4/13- will monitor for trends  4/14- up 0.9 kg in 1 day- if goes up again, will check BNP  4/15- Weight up another 1 kg- will check BNP and labs tomorrow  4/17- Weight back down slightly- will monitor- BNP only 45 16. Hyperlipidemia. Zocor 17. Code status: Changed to DNR as per patient's wishes. 18. Leukocytosis   4/12- on prednisone taper- the cause since afebrile and no Sx's of illness-con't steroid taper 19. Wheezing  4/13- asked pt to ask for prn albuterol and added Flonase for allergies/cough  4/14- will add Albuterol nebs qam in addition to  prn, since sounds wheezing every AM.  4/15- still wheezing- hadn't gotten albuterol yet this AM- will add allergy meds to see if helps  4/16- still wheezy but better.   4/17- after nebs in AM, sounds much better- encouraged pt to use prn Nebs  LOS: 8  days A FACE TO FACE EVALUATION WAS PERFORMED  Antonin Meininger 10/04/2019, 12:15 PM

## 2019-10-04 NOTE — Progress Notes (Signed)
RT placed patient on CPAP HS. 2L O2 bleed in needed. Patient tolerating well.  ?

## 2019-10-05 ENCOUNTER — Inpatient Hospital Stay (HOSPITAL_COMMUNITY): Payer: Medicare Other

## 2019-10-05 MED ORDER — DANTROLENE SODIUM 25 MG PO CAPS
50.0000 mg | ORAL_CAPSULE | Freq: Every day | ORAL | Status: DC
  Administered 2019-10-05: 50 mg via ORAL
  Filled 2019-10-05: qty 2

## 2019-10-05 NOTE — Progress Notes (Signed)
[place] PHYSICAL MEDICINE & REHABILITATION PROGRESS NOTE   Pt reports slept all day yesterday since so sleepy. Spasms still really bad in LLE-  Had Temperature last night of 100.6- didn't feel bad- doesn't think is ill.  Discused Dantrolene- explained it makes some people sleepy, but only ~ 1-3%  He's willing to try it and see how it goes.  L foot/ankle still bothering him, but when saw, L foot out/laterally rotated due to sitting frog legged- as a result, L lateral malleolus is on bed.    ROS:   Pt denies SOB, abd pain, CP, N/V/C/D, and vision changes   Objective:   No results found. Recent Labs    10/03/19 0557  WBC 11.8*  HGB 13.1  HCT 41.6  PLT 178   Recent Labs    10/03/19 0557  NA 142  K 4.9  CL 99  CO2 33*  GLUCOSE 96  BUN 20  CREATININE 0.93  CALCIUM 9.0    Intake/Output Summary (Last 24 hours) at 10/05/2019 1036 Last data filed at 10/05/2019 1015 Gross per 24 hour  Intake 1192 ml  Output 2227 ml  Net -1035 ml     Physical Exam: Vital Signs Blood pressure 125/70, pulse 71, temperature 98.4 F (36.9 C), temperature source Oral, resp. rate 18, height 5\' 7"  (1.702 m), weight 98.3 kg, SpO2 97 %.   Constitutional:sitting up in bed; appropriate, NAD HENT:  Conjugate gaze  Cardiovascular: RRR Respiratory:s/p nebs- less coarse, less wheezy, good air movement [id number], NT, ND, (+)BS Musculoskeletal:  Cervical back: Normal range of motion.  Neurological:Ox3 .CN exam intact. UE grossly 5/5 with trace to 1+ DTR's. RLE 4/5 prox to distal with pain inhibition. LLE: 2+ to 3+ prox to distal with more pain inhibition. Sensation to LT/temperature decreased in LLE up to nearly umbilicus. DTR's in LE surprisingly brisk at 2+ patella/achilles.  LLE keeps jumping/spasms when I'm in room- >5x in 10 minutes  Skin: laceration right index finger, abrasions healing well on left knee,  L lateral malleolus dime sized- pink, a little red around wound- ~ 2-69mm  deep; little slough- no change on exam PRAFOs in place, but since frog legged, lateral maleolus on bed . Buttocks slightly red, moist, no breakdown.  Psychiatric:appropriate, frustrated about spasms  Assessment/Plan: 1. Functional deficits secondary to lumbar spinal stenosis with neurogenic claudication which require 3+ hours per day of interdisciplinary therapy in a comprehensive inpatient rehab setting.  Physiatrist is providing close team supervision and 24 hour management of active medical problems listed below.  Physiatrist and rehab team continue to assess barriers to discharge/monitor patient progress toward functional and medical goals  Care Tool:  Bathing    Body parts bathed by patient: Right arm, Left arm, Chest, Abdomen, Front perineal area, Right upper leg, Left upper leg, Right lower leg, Left lower leg, Face   Body parts bathed by helper: Buttocks     Bathing assist Assist Level: Minimal Assistance - Patient > 75%     Upper Body Dressing/Undressing Upper body dressing   What is the patient wearing?: Pull over shirt    Upper body assist Assist Level: Supervision/Verbal cueing    Lower Body Dressing/Undressing Lower body dressing      What is the patient wearing?: Incontinence brief, Pants     Lower body assist Assist for lower body dressing: Total Assistance - Patient < 25%     Toileting Toileting    Toileting assist Assist for toileting: Dependent - Patient 0%  Transfers Chair/bed transfer  Transfers assist     Chair/bed transfer assist level: Maximal Assistance - Patient 25 - 49%     Locomotion Ambulation   Ambulation assist   Ambulation activity did not occur: Safety/medical concerns  Assist level: 2 helpers Assistive device: Parallel bars Max distance: 5'   Walk 10 feet activity   Assist  Walk 10 feet activity did not occur: Safety/medical concerns        Walk 50 feet activity   Assist Walk 50 feet with 2 turns  activity did not occur: Safety/medical concerns         Walk 150 feet activity   Assist Walk 150 feet activity did not occur: Safety/medical concerns         Walk 10 feet on uneven surface  activity   Assist Walk 10 feet on uneven surfaces activity did not occur: Safety/medical concerns         Wheelchair     Assist Will patient use wheelchair at discharge?: Yes Type of Wheelchair: Manual    Wheelchair assist level: Supervision/Verbal cueing Max wheelchair distance: 150'    Wheelchair 50 feet with 2 turns activity    Assist        Assist Level: Supervision/Verbal cueing   Wheelchair 150 feet activity     Assist      Assist Level: Supervision/Verbal cueing   Blood pressure 125/70, pulse 71, temperature 98.4 F (36.9 C), temperature source Oral, resp. rate 18, height 5\' 7"  (1.702 m), weight 98.3 kg, SpO2 97 %.    Medical Problem List and Plan: 1.Decreased functional ability due to lumbar stenosis/radiculopathy with neurogenic claudication. MRI's also suggest (albeit with motion artifact) distal thoracic involvement as well.  Exam is more c/w this too.              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             -consider re-imaging of thoracic spine (perhaps with sedation) to better look at thoracic cord.  4/13- will give pt PRAFOs B/L to prevent heel/ankle skin breakdown at night  4/15- ordered new PRAFOS with kickstands- since original ones didn't have them- they are usually standard.   4/16- PRAFOs with kickstands came- not helping because pt sits with hips externally rotated  4/18- sitting frog legged= can't fix issue with this position             -Continue CIR PT, OT -patient may shower -ELOS/Goals: 14-18 days,  supervision to min assist with PT,OT 2. Antithrombotics: -DVT/anticoagulation:Lovenox. Check vascular study  4/12- Dopplers (-)- con't lovenox -antiplatelet therapy: Aspirin 81 mg daily 3. Pain Management:Baclofen 15 mg 3 times daily, Neurontin 100 mg nightly, OxyContin 10 mg every 12 hours, hydrocodone as needed as well as Imitrex for headaches -adjust regimen as needed based on efficacy, wean as possible             -oral steroids to taper  -pain present in therapy yesterday and at rest this morning at left ankle wound site, wound assessed and healing well. Has not been taking hydrocodone; one dose administered this morning.   4/13- will add Zanaflex 2 mg TID for spasticity  4/14- spasticity improved per pt  4/15- spasticity back to being a problem-  will order Valium 5 mg BID prn for pt.    4/18- discussed dantrolene- will try 50 mg QHS- if makes too sleepy, can stop it.  4. Mood:Provide emotional support -antipsychotic agents: N/A 5. Neuropsych: This patientiscapable of making decisions on hisown behalf. 6. Skin/Wound Care:place steristrips over right index finger lac.                          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    4/15- ordered new PRAFOs to keep feet from turning out/rotating out  4/16- PRAFOs came with kickstands                         -foam dressing left knee 7. Fluids/Electrolytes/Nutrition:Routine in and outs with follow-up chemistries. Diet changed to regular as per patient's preference. He states he would not follow current diet at home as is unappealing to him. I discussed that our goal is to simulate home environment as medications are adjusted according to current labs/vitals. I discussed that goal of heart healthy diet is to decrease sodium and  saturated fat to protect the heart and circulatory system. Discussed his current diet and advised that he cut back on sugary juices, which he likes. Recommended replacing with water. He asked about his [employer] which he and his wife drink a lot of at home. Advised that despite it having no calories, it has a lot of processed ingredients and his goal should be to minimize processed foods. He expressed understanding. Continue nutritional counseling.  8. Chronic restless leg syndrome. Sinemet 10-100 mg twice daily. 9. 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 OSA with CPAP/2 L chronic oxygen at home. Continue inhalers 11. 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 Follow-up outpatient 12. Diabetes mellitus. Hemoglobin A1c 5.8. SSI. Changed to [facility] HS for CBG and SSI  4/17- pt refused to get BGs checked- stopped 4/12- A1c 5.8- 13. Neurogenic bowel and bladder. Check PVR  4/12- no BM since Friday- if no BM overnight, will give Mg citrate and enema tomorrow.   4/13- had extra large BM last night- con't regimen  4/15- pt says no constipation 14. Hypothyroidism. Synthroid 15. Chronic diastolic congestive heart failure. Monitor for any signs of fluid overload. Check regular weights   Filed Weights   10/02/19 0459 10/04/19 0526 10/05/19 0429  Weight: 99.4 kg 98.9 kg 98.3 kg   4/13- will monitor for trends  4/14- up 0.9 kg in 1 day- if goes up again, will check BNP  4/15- Weight up another 1 kg- will check BNP and labs tomorrow  4/17- Weight back down slightly- will monitor- BNP only 45  4/18- weight down 1/2 kg- will con't regimen 16. Hyperlipidemia. Zocor 17. Code status: Changed to DNR as per patient's wishes. 18. Leukocytosis   4/12- on prednisone taper- the cause since afebrile and no Sx's of illness-con't steroid taper 19. Wheezing  4/13- asked pt to ask  for prn albuterol and added Flonase for allergies/cough  4/14- will add Albuterol nebs qam in addition to prn, since sounds wheezing every AM.  4/15- still wheezing- hadn't gotten albuterol yet this AM- will add allergy meds to see if helps  4/16- still wheezy but better.   4/17- after nebs in AM, sounds much better- encouraged pt to use prn Nebs  LOS: 9 days A FACE TO FACE EVALUATION WAS PERFORMED  Lorayne Getchell 10/05/2019, 10:36 AM

## 2019-10-05 NOTE — Progress Notes (Signed)
Occupational Therapy Session Note  Patient Details  Name: [name] MRN: [id number] Date of Birth: 1940-09-05  Today's Date: 10/05/2019 OT Individual Time: [id number] Individual Time Calculation (min): 30 min    Short Term Goals: Week 2:  OT Short Term Goal 1 (Week 2): STG=LTG secondary to ELOS  Skilled Therapeutic Interventions/Progress Updates:  Pt received supine in bed with PRAFOs donned however pt presenting in frog leg position. Doffed PRAFOs and noted lateral malleous skin breakdown. Applied bandage to decrease skin irritation and donned TEDs with total A. Pt requested to don pants from supine needing total A. Pt roll R<>L to pull pants up to waist line. Pt required MOD A to advance BLEs to EOB and elevate trunk into sitting to transition from supine>sitting. Pt with noted dyspnea when transitioning into sitting needing increased rest break and verbal cues to incorporate pursed lip breathing into mobility. Pt presents with increased leg spasms this session LLE>RLE.Pt required MOD A to sit<>stand to stedy from EOB. Pt transferred to w/c with total A in stedy. Pt completed seated grooming tasks in w/c with set- up assist. Pt left seated in w/c with safety belt activated and all needs within reach.   Therapy Documentation Precautions:  Precautions Precautions: Fall Restrictions Weight Bearing Restrictions: No General:   Vital Signs: Therapy Vitals Pulse Rate: 71 Resp: 18 Patient Position (if appropriate): Lying Oxygen Therapy SpO2: 97 % O2 Device: Room Air Pain: Pt reports pain in BLEs; offered increased rest breaks as pain mgmt strategy.   Therapy/Group: Individual Therapy  [name] 10/05/2019, 11:57 AM

## 2019-10-05 NOTE — Progress Notes (Signed)
Occupational Therapy Session Note  Patient Details  Name: SABREE ZAVALA MRN: [id number] Date of Birth: October 15, 1940  Today's Date: 10/05/2019 OT Individual Time: 0115-0130 OT Individual Time Calculation (min): 15 min  and Today's Date: 10/05/2019 OT Missed Time: 15 Minutes Missed Time Reason: Other (comment)(pt request to finish lunch)   Short Term Goals: Week 2:  OT Short Term Goal 1 (Week 2): STG=LTG secondary to ELOS  Skilled Therapeutic Interventions/Progress Updates:  Pt received seated in w/c eating lunch. Pt request to finish lunch with OTA returning 15 mins later. Pt transported to therapy gym for time mgmt. Session focus on functional sit<>stands utilizing eva walker. Pt with good success using Eva walker noted to be able to stand straighter during sit<>stands. Pt completed x4 with MOD A able to stand ~ 15 seconds each trial. Pt transported back to room in similar fashion as previously indicated where pt left up in w/c with alarm belt activated and all needs within reach.   Therapy Documentation Precautions:  Precautions Precautions: Fall Restrictions Weight Bearing Restrictions: No General: General OT Amount of Missed Time: 15 Minutes Vital Signs: Therapy Vitals Temp: 98.8 F (37.1 C) Temp Source: Oral Pulse Rate: 88 Resp: 16 BP: (!) 111/56 Patient Position (if appropriate): Sitting Oxygen Therapy SpO2: 92 % O2 Device: Room Air Pain: Pt report no pain during session.   Therapy/Group: Individual Therapy  [name] 10/05/2019, 3:06 PM

## 2019-10-06 ENCOUNTER — Inpatient Hospital Stay (HOSPITAL_COMMUNITY): Payer: Medicare Other | Admitting: Physical Therapy

## 2019-10-06 ENCOUNTER — Inpatient Hospital Stay (HOSPITAL_COMMUNITY): Payer: Medicare Other

## 2019-10-06 LAB — BASIC METABOLIC PANEL
Anion gap: 11 (ref 5–15)
BUN: 17 mg/dL (ref 8–23)
CO2: 28 mmol/L (ref 22–32)
Calcium: 8.7 mg/dL — ABNORMAL LOW (ref 8.9–10.3)
Chloride: 99 mmol/L (ref 98–111)
Creatinine, Ser: 0.79 mg/dL (ref 0.61–1.24)
GFR calc Af Amer: >60 mL/min (ref >60)
GFR calc non Af Amer: >60 mL/min (ref >60)
Glucose, Bld: 104 mg/dL — ABNORMAL HIGH (ref 70–99)
Potassium: 4.4 mmol/L (ref 3.5–5.1)
Sodium: 138 mmol/L (ref 135–145)

## 2019-10-06 LAB — CBC WITH DIFFERENTIAL/PLATELET
Abs Immature Granulocytes: 0.1 10*3/uL — ABNORMAL HIGH (ref 0.00–0.07)
Basophils Absolute: 0 10*3/uL (ref 0.0–0.1)
Basophils Relative: 0 %
Eosinophils Absolute: 0.2 10*3/uL (ref 0.0–0.5)
Eosinophils Relative: 1 %
HCT: 39.8 % (ref 39.0–52.0)
Hemoglobin: 12.8 g/dL — ABNORMAL LOW (ref 13.0–17.0)
Immature Granulocytes: 1 %
Lymphocytes Relative: 5 %
Lymphs Abs: 0.8 10*3/uL (ref 0.7–4.0)
MCH: 29.7 pg (ref 26.0–34.0)
MCHC: 32.2 g/dL (ref 30.0–36.0)
MCV: 92.3 fL (ref 80.0–100.0)
Monocytes Absolute: 1.1 10*3/uL — ABNORMAL HIGH (ref 0.1–1.0)
Monocytes Relative: 8 %
Neutro Abs: 12.6 10*3/uL — ABNORMAL HIGH (ref 1.7–7.7)
Neutrophils Relative %: 85 %
Platelets: 163 10*3/uL (ref 150–400)
RBC: 4.31 MIL/uL (ref 4.22–5.81)
RDW: 15 % (ref 11.5–15.5)
WBC: 14.7 10*3/uL — ABNORMAL HIGH (ref 4.0–10.5)
nRBC: 0 % (ref 0.0–0.2)

## 2019-10-06 MED ORDER — CALCIUM CITRATE 950 (200 CA) MG PO TABS
200.0000 mg | ORAL_TABLET | Freq: Every day | ORAL | Status: DC
  Administered 2019-10-06 – 2019-10-28 (×22): 200 mg via ORAL
  Filled 2019-10-06 (×23): qty 1

## 2019-10-06 NOTE — Progress Notes (Signed)
[place] PHYSICAL MEDICINE & REHABILITATION PROGRESS NOTE    Subjective: Patient very sleepy this morning and does not feel he will be able to tolerate therapy well. Denies pain. Slept well last night.  WBC 14.7 this morning.   ROS: Pt denies SOB, abd pain, CP, N/V/C/D, and vision changes   Objective:   No results found. Recent Labs    10/06/19 0537  WBC 14.7*  HGB 12.8*  HCT 39.8  PLT 163   Recent Labs    10/06/19 0537  NA 138  K 4.4  CL 99  CO2 28  GLUCOSE 104*  BUN 17  CREATININE 0.79  CALCIUM 8.7*    Intake/Output Summary (Last 24 hours) at 10/06/2019 0912 Last data filed at 10/06/2019 0510 Gross per 24 hour  Intake 592 ml  Output 1975 ml  Net -1383 ml     Physical Exam: Vital Signs Blood pressure (!) 142/83, pulse (!) 102, temperature 99.1 F (37.3 C), temperature source Oral, resp. rate 18, height 5\' 7"  (1.702 m), weight 98.6 kg, SpO2 92 %. Constitutional:sitting up in bed; appropriate, NAD, somnolent.  HENT:  Conjugate gaze  Cardiovascular: RRR Respiratory:s/p nebs- less coarse, less wheezy, good air movement [id number], NT, ND, (+)BS Musculoskeletal:  Cervical back: Normal range of motion.  Neurological:Ox3 .CN exam intact. UE grossly 5/5 with trace to 1+ DTR's. RLE 4/5 prox to distal with pain inhibition. LLE: 2+ to 3+ prox to distal with more pain inhibition. Sensation to LT/temperature decreased in LLE up to nearly umbilicus. DTR's in LE surprisingly brisk at 2+ patella/achilles.  LLE keeps jumping/spasms when I'm in room- >5x in 10 minutes Skin: laceration right index finger, abrasions healing well on left knee,  L lateral malleolus dime sized- pink, a little red around wound- ~ 2-47mm deep; little slough- no change on exam PRAFOs in place, but since frog legged, lateral maleolus on bed . Buttocks slightly red, moist, no breakdown.  Psychiatric:appropriate, frustrated about spasms  Assessment/Plan: 1. Functional deficits secondary to  lumbar spinal stenosis with neurogenic claudication which require 3+ hours per day of interdisciplinary therapy in a comprehensive inpatient rehab setting.  Physiatrist is providing close team supervision and 24 hour management of active medical problems listed below.  Physiatrist and rehab team continue to assess barriers to discharge/monitor patient progress toward functional and medical goals  Care Tool:  Bathing    Body parts bathed by patient: Right arm, Left arm, Chest, Abdomen, Front perineal area, Right upper leg, Left upper leg, Right lower leg, Left lower leg, Face   Body parts bathed by helper: Buttocks     Bathing assist Assist Level: Minimal Assistance - Patient > 75%     Upper Body Dressing/Undressing Upper body dressing   What is the patient wearing?: Pull over shirt    Upper body assist Assist Level: Supervision/Verbal cueing    Lower Body Dressing/Undressing Lower body dressing      What is the patient wearing?: Pants     Lower body assist Assist for lower body dressing: Total Assistance - Patient < 25%     Toileting Toileting    Toileting assist Assist for toileting: Dependent - Patient 0%     Transfers Chair/bed transfer  Transfers assist     Chair/bed transfer assist level: Total Assistance - Patient < 25%(MOD A sit<>stand from stedy)     Locomotion Ambulation   Ambulation assist   Ambulation activity did not occur: Safety/medical concerns  Assist level: 2 helpers Assistive device: Parallel bars Max  distance: 5'   Walk 10 feet activity   Assist  Walk 10 feet activity did not occur: Safety/medical concerns        Walk 50 feet activity   Assist Walk 50 feet with 2 turns activity did not occur: Safety/medical concerns         Walk 150 feet activity   Assist Walk 150 feet activity did not occur: Safety/medical concerns         Walk 10 feet on uneven surface  activity   Assist Walk 10 feet on uneven surfaces  activity did not occur: Safety/medical concerns         Wheelchair     Assist Will patient use wheelchair at discharge?: Yes Type of Wheelchair: Manual    Wheelchair assist level: Supervision/Verbal cueing Max wheelchair distance: 150'    Wheelchair 50 feet with 2 turns activity    Assist        Assist Level: Supervision/Verbal cueing   Wheelchair 150 feet activity     Assist      Assist Level: Supervision/Verbal cueing   Blood pressure (!) 142/83, pulse (!) 102, temperature 99.1 F (37.3 C), temperature source Oral, resp. rate 18, height 5\' 7"  (1.702 m), weight 98.6 kg, SpO2 92 %.    Medical Problem List and Plan: 1.Decreased functional ability due to lumbar stenosis/radiculopathy with neurogenic claudication. MRI's also suggest (albeit with motion artifact) distal thoracic involvement as well.  Exam is more c/w this too.              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             -consider re-imaging of thoracic spine (perhaps with sedation) to better look at thoracic cord.  4/13- will give pt PRAFOs B/L to prevent heel/ankle skin breakdown at night  4/15- ordered new PRAFOS with kickstands- since original ones didn't have them- they are usually standard.   4/16- PRAFOs with kickstands came- not helping because pt sits with hips externally rotated  4/18- sitting frog legged= can't fix issue with this position             -Continue CIR PT, OT -patient may shower -ELOS/Goals: 14-18 days, supervision to min assist with PT,OT 2. Antithrombotics: -DVT/anticoagulation:Lovenox. Check vascular study  4/12- Dopplers (-)- con't lovenox -antiplatelet therapy: Aspirin 81 mg daily 3. Pain Management:Baclofen 15 mg 3 times  daily, Neurontin 100 mg nightly, OxyContin 10 mg every 12 hours, hydrocodone as needed as well as Imitrex for headaches -adjust regimen as needed based on efficacy, wean as possible             -oral steroids to taper  -pain present in therapy yesterday and at rest this morning at left ankle wound site, wound assessed and healing well. Has not been taking hydrocodone; one dose administered this morning.   4/13- will add Zanaflex 2 mg TID for spasticity  4/14- spasticity improved per pt  4/15- spasticity back to being a problem- will order Valium 5 mg BID prn for pt.    4/18- discussed dantrolene- will try 50 mg QHS- if makes too sleepy, can stop it.   4/19: Patient was very sleepy this morning. Will stop Dantrolene.  4. Mood:Provide emotional support -antipsychotic agents: N/A 5.  Neuropsych: This patientiscapable of making decisions on hisown behalf. 6. Skin/Wound Care:place steristrips over right index finger lac.                          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    4/15- ordered new PRAFOs to keep feet from turning out/rotating out  4/16- PRAFOs came with kickstands                         -foam dressing left knee 7. Fluids/Electrolytes/Nutrition:Routine in and outs with follow-up chemistries. Diet changed to regular as per patient's preference. He states he would not follow current diet at home as is unappealing to him. I discussed that our goal is to simulate home environment as medications are adjusted according to current labs/vitals. I discussed that goal of heart healthy diet is to decrease sodium and saturated fat to protect the heart and circulatory system. Discussed his current diet and advised that he cut back on sugary juices, which he likes. Recommended replacing with water. He  asked about his [employer] which he and his wife drink a lot of at home. Advised that despite it having no calories, it has a lot of processed ingredients and his goal should be to minimize processed foods. He expressed understanding. Continue nutritional counseling.   4/19: Electrolytes stable on BMP today. Calcium low at 8.7. Will start calcium supplement.  8. Chronic restless leg syndrome. Sinemet 10-100 mg twice daily. 9. 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 OSA with CPAP/2 L chronic oxygen at home. Continue inhalers 11. 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 Follow-up outpatient 12. Diabetes mellitus. Hemoglobin A1c 5.8. SSI. Changed to [facility] HS for CBG and SSI  4/17- pt refused to get BGs checked- stopped 4/12- A1c 5.8- 13. Neurogenic bowel and bladder. Check PVR  4/12- no BM since Friday- if no BM overnight, will give Mg citrate and enema tomorrow.   4/13- had extra large BM last night- con't regimen  4/19- patient denies constipation. 14. Hypothyroidism. Synthroid 15. Chronic diastolic congestive heart failure. Monitor for any signs of fluid overload. Check regular weights   Filed Weights   10/04/19 0526 10/05/19 0429 10/06/19 0509  Weight: 98.9 kg 98.3 kg 98.6 kg   4/13- will monitor for trends  4/14- up 0.9 kg in 1 day- if goes up again, will check BNP  4/15- Weight up another 1 kg- will check BNP and labs tomorrow  4/17- Weight back down slightly- will monitor- BNP only 45  4/18- weight down 1/2 kg- will con't regimen 16. Hyperlipidemia. Zocor 17. Code status: Changed to DNR as per patient's wishes. 18. Leukocytosis   4/12- on prednisone taper- the cause since afebrile and no Sx's of illness-con't steroid taper 19. Wheezing  4/13- asked pt to ask for prn albuterol and added Flonase for allergies/cough  4/14- will add Albuterol nebs  qam in addition to prn, since sounds wheezing every AM.  4/15- still wheezing- hadn't gotten albuterol yet this AM- will add allergy meds to see if helps  4/16- still wheezy but better.   4/17- after nebs in AM, sounds much better- encouraged pt to use prn  Nebs  LOS: 10 days A FACE TO FACE EVALUATION WAS PERFORMED  Kache Mcclurg P Viha Kriegel 10/06/2019, 9:12 AM

## 2019-10-06 NOTE — Plan of Care (Signed)
  Problem: Consults Goal: RH SPINAL CORD INJURY PATIENT EDUCATION Description:  See Patient Education module for education specifics.  Outcome: Progressing Goal: Skin Care Protocol Initiated - if Braden Score 18 or less Description: If consults are not indicated, leave blank or document N/A Outcome: Progressing Goal: Diabetes Guidelines if Diabetic/Glucose > 140 Description: If diabetic or lab glucose is > 140 mg/dl - Initiate Diabetes/Hyperglycemia Guidelines & Document Interventions  Outcome: Progressing   Problem: SCI BOWEL ELIMINATION Goal: RH STG MANAGE BOWEL WITH ASSISTANCE Description: STG Manage Bowel with min Assistance. Outcome: Progressing Flowsheets (Taken 10/06/2019 1430) STG: Pt will manage bowels with assistance: 2-Maximum assistance Goal: RH STG SCI MANAGE BOWEL PROGRAM W/ASSIST OR AS APPROPRIATE Description: STG SCI Manage bowel program w/ min assist or as appropriate. Outcome: Progressing Flowsheets (Taken 10/06/2019 1430) STG: SCI Pt will manage bowels with assistance or as appropriate: Maximum assist   Problem: SCI BLADDER ELIMINATION Goal: RH STG MANAGE BLADDER WITH ASSISTANCE Description: STG Manage Bladder With min/mod Assistance Outcome: Progressing Flowsheets (Taken 10/06/2019 1430) STG: Pt will manage bladder with assistance: 1-Total assistance   Problem: RH SKIN INTEGRITY Goal: RH STG SKIN FREE OF INFECTION/BREAKDOWN Description: Patients skin will remain free from further infection or breakdown with mod assist. Outcome: Progressing Goal: RH STG MAINTAIN SKIN INTEGRITY WITH ASSISTANCE Description: STG Maintain Skin Integrity With mod Assistance. Outcome: Progressing Flowsheets (Taken 10/06/2019 1430) STG: Maintain skin integrity with assistance: 2-Maximum assistance Goal: RH STG ABLE TO PERFORM INCISION/WOUND CARE W/ASSISTANCE Description: STG Able To Perform Incision/Wound Care With total Assistance from caregiver. Outcome: Progressing Flowsheets  (Taken 10/06/2019 1430) STG: Pt will be able to perform incision/wound care with assistance: 2-Maximum assistance   Problem: RH SAFETY Goal: RH STG ADHERE TO SAFETY PRECAUTIONS W/ASSISTANCE/DEVICE Description: STG Adhere to Safety Precautions With mod I Assistance/Device. Outcome: Progressing Flowsheets (Taken 10/06/2019 1430) STG:Pt will adhere to safety precautions with assistance/device: 2-Maximum assistance   Problem: RH PAIN MANAGEMENT Goal: RH STG PAIN MANAGED AT OR BELOW PT'S PAIN GOAL Description: < 4 Outcome: Progressing

## 2019-10-06 NOTE — Progress Notes (Signed)
Occupational Therapy Session Note  Patient Details  Name: SWAYAM CORPORON MRN: [id number] Date of Birth: May 25, 1941  Today's Date: 10/06/2019 OT Individual Time: 1100-1130 OT Individual Time Calculation (min): 30 min  and Today's Date: 10/06/2019 OT Missed Time: 30 Minutes Missed Time Reason: Patient fatigue;Other (comment)(general lethargy and unable to actively participate)   Short Term Goals: Week 2:  OT Short Term Goal 1 (Week 2): STG=LTG secondary to ELOS  Skilled Therapeutic Interventions/Progress Updates:    Pt asleep in bed upon arrival but easily aroused. BP 108/63, HR 94, O2 93% on 4L O2. Pt very lethargic. Pt commented that this "drowsiness" has happened before when medication changes were made. Discussed LTG and pt frustrated that he is not making the progress expected. OT intervention with focus on bed mobility. Pt required increased assistance for all bed mobility and noted with increased SOB. Pt missed 30 mins skilled OT services.  Therapy Documentation Precautions:  Precautions Precautions: Fall Restrictions Weight Bearing Restrictions: No General: General OT Amount of Missed Time: 30 Minutes Pain: Pain Assessment Pain Scale: 0-10 Pain Score: 0-No pain   Therapy/Group: Individual Therapy  [name] 10/06/2019, 12:06 PM

## 2019-10-06 NOTE — Progress Notes (Signed)
Physical Therapy Session Note  Patient Details  Name: [name] MRN: [id number] Date of Birth: 04-04-1941  Today's Date: 10/06/2019 PT Individual Time: 0900-0955 PT Individual Time Calculation (min): 55 min   Short Term Goals: Week 2:  PT Short Term Goal 1 (Week 2): Pt will complete least restrictive transfer with mod A consistently PT Short Term Goal 2 (Week 2): Pt will perform sit to stand wtih mod A consistently PT Short Term Goal 3 (Week 2): Pt will ambulate x 10 ft with LRAD  Skilled Therapeutic Interventions/Progress Updates:    Session 1: Pt received seated in bed, appears very lethargic. Pt also on 4L O2 this AM which is new for him as previously he was on RA. Seated BP 120/63, HR 95, and SpO2 91% at rest. Pt appears to have not eaten his breakfast yet this AM, reports he is "starving" but has been too fatigued to attempt eating. Pt agreeable to try to get up to w/c to eat breakfast. Seated in bed to sitting EOB with max A. Pt requires assist for BLE management and significant assist for trunk control due to posterior bias. Pt unable to maintain unsupported sitting EOB this AM. Attempt sit to stand to stedy with assist x 2 from elevated bed, pt unable to come to a full stand to place stedy seat behind him. Sit to supine assist x 2 for trunk control and BLE management. Pt is assist x 2 to scoot up towards [place]. Once seated in bed again set pt up with his breakfast. RN notified that pt not normally this lethargic in the AM and that he exhibits increased assist level needed. RN to notify PA. Pt left seated in bed with breakfast in reach, bed alarm in place, call button in reach.  Therapy Documentation Precautions:  Precautions Precautions: Fall Restrictions Weight Bearing Restrictions: No    Therapy/Group: Individual Therapy   Excell Seltzer, PT, DPT  10/06/2019, 3:10 PM

## 2019-10-06 NOTE — Progress Notes (Signed)
Physical Therapy Note  Patient Details  Name: [name] MRN: [id number] Date of Birth: June 03, 1941 Today's Date: 10/06/2019    Attempted to see patient for scheduled PM session. Pt seated up in w/c in room but reports feeling too fatigued to participate in therapy session this afternoon. Pt missed 30 min of scheduled skilled therapy services due to fatigue. Will continue per POC.   Excell Seltzer, PT, DPT  10/06/2019, 3:12 PM

## 2019-10-06 NOTE — Progress Notes (Signed)
Occupational Therapy Session Note  Patient Details  Name: [name] MRN: [id number] Date of Birth: 1941-03-22  Today's Date: 10/06/2019 OT Individual Time: [id number] Individual Time Calculation (min): 22 min  and Today's Date: 10/06/2019 OT Missed Time: 23 Minutes Missed Time Reason: Other (comment)(pt request to finish up lunch and reports fatigue)   Short Term Goals: Week 2:  OT Short Term Goal 1 (Week 2): STG=LTG secondary to ELOS  Skilled Therapeutic Interventions/Progress Updates:  Pt received supine in bed reporting fatigue and requesting to finish up lunch; returned 23 mins later for session. Pt on 4L O2 with O2 98%. Doffed O2 during bed mobility with pt desaturating to 91% with mobility. Pt required MAX A for bed mobility to advance BLEs to EOB and elevate trunk into sitting. MAX A +2 for sit<>stand with stedy to transfer pt to w/c. Pt noted to be much more congested this session needing cues to incorporate pursed lip breathing into session and presents with decreased activity tolerance. Pt left up in w/c with safety belt alarm activated with all needs within reach. Reapplied O2 on 4L with O2 99% at end of session.   Therapy Documentation Precautions:  Precautions Precautions: Fall Restrictions Weight Bearing Restrictions: No General: General OT Amount of Missed Time: 23 Minutes PT Missed Treatment Reason: Patient fatigue Vital Signs: Therapy Vitals Temp: 99.3 F (37.4 C) Temp Source: Oral Pulse Rate: 88 Resp: 19 BP: 104/66 Patient Position (if appropriate): Sitting Oxygen Therapy SpO2: 96 % O2 Device: Nasal Cannula Pain: Pt reports mild pain in L ankle; utilized repositioning strategies as pain mgmt intervention.   Therapy/Group: Individual Therapy  [name] 10/06/2019, 3:37 PM

## 2019-10-07 ENCOUNTER — Inpatient Hospital Stay (HOSPITAL_COMMUNITY): Payer: Medicare Other

## 2019-10-07 ENCOUNTER — Inpatient Hospital Stay (HOSPITAL_COMMUNITY): Payer: Medicare Other | Admitting: Physical Therapy

## 2019-10-07 LAB — URINALYSIS, ROUTINE W REFLEX MICROSCOPIC
Bilirubin Urine: NEGATIVE
Glucose, UA: NEGATIVE mg/dL
Hgb urine dipstick: NEGATIVE
Ketones, ur: NEGATIVE mg/dL
Nitrite: NEGATIVE
Protein, ur: NEGATIVE mg/dL
Specific Gravity, Urine: 1.013 (ref 1.005–1.030)
WBC, UA: >50 WBC/hpf — ABNORMAL HIGH (ref 0–5)
pH: 6 (ref 5.0–8.0)

## 2019-10-07 IMAGING — DX DG CHEST 1V PORT
1 series · 1 of 1 positions shown · non-contrast
Comparison: [DATE].

CLINICAL DATA: Leukocytosis.

EXAM:
PORTABLE CHEST 1 VIEW

[chest]
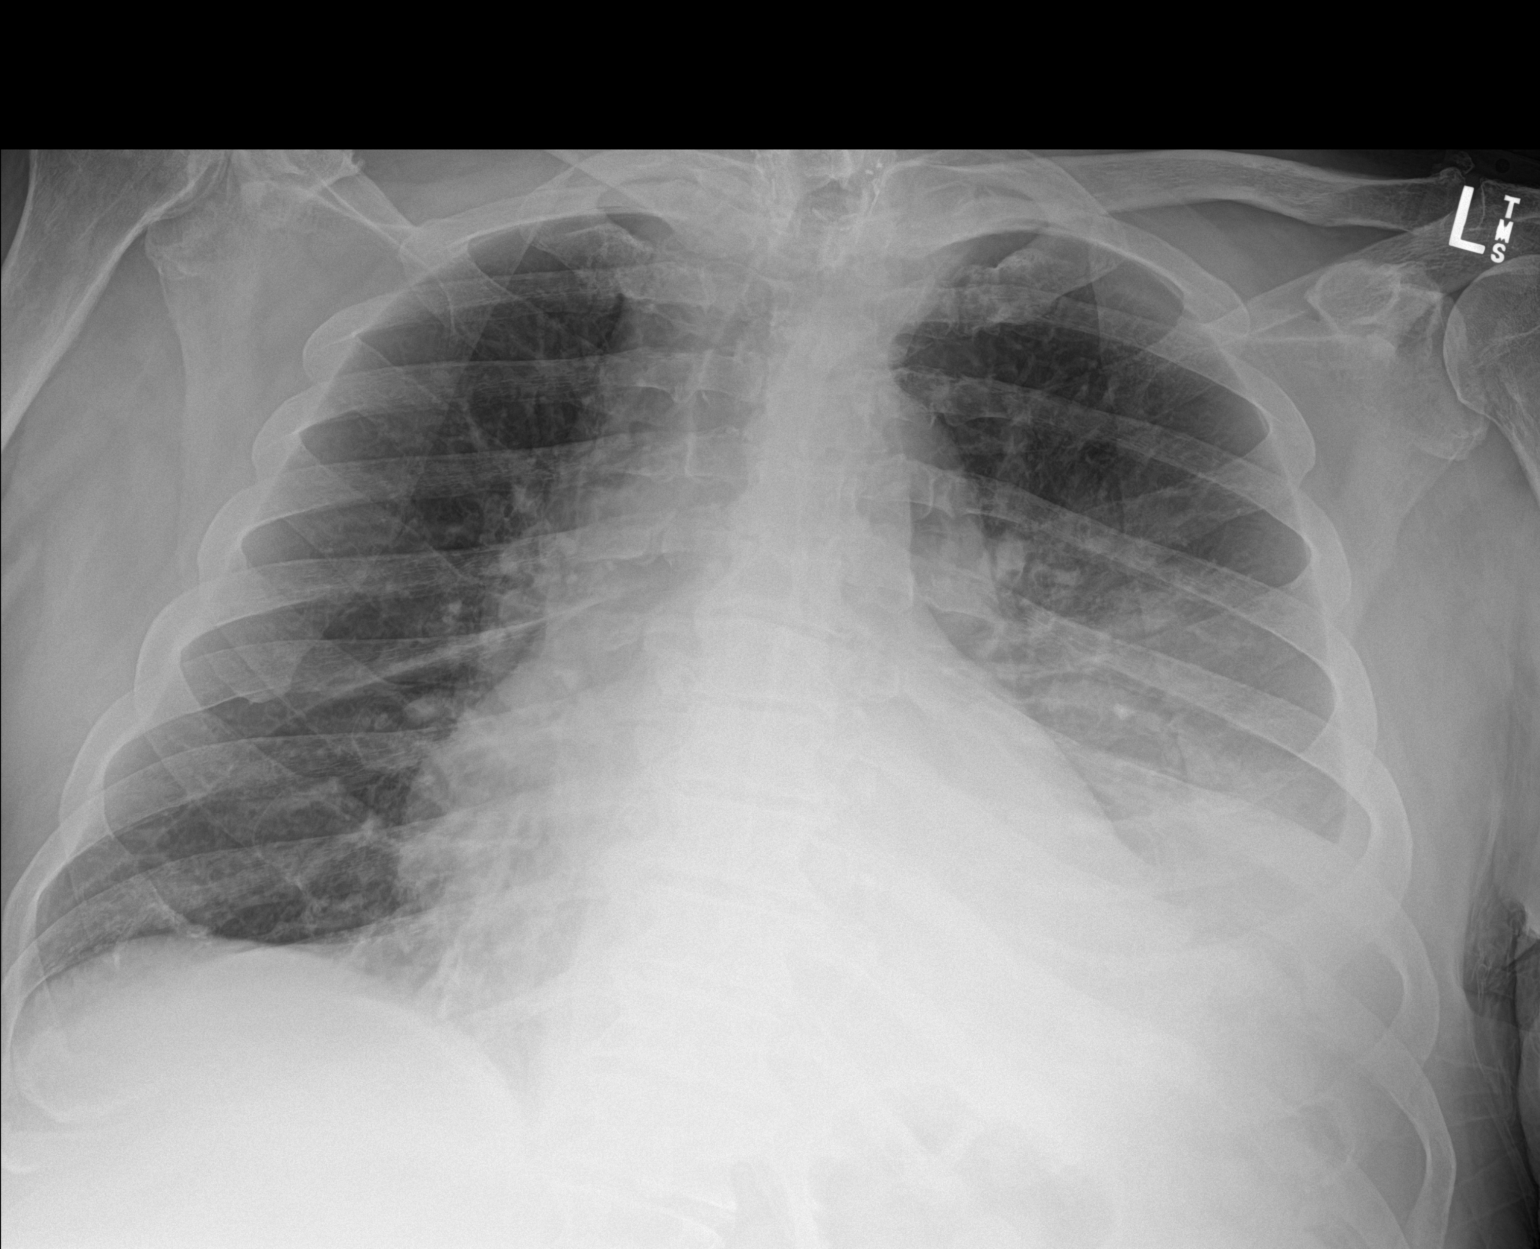

[1 of 1 positions shown; findings below may reference images not displayed]

FINDINGS: Stable cardiomegaly. No pneumothorax is noted. Right lung is clear.
Large left lower lobe airspace opacity is noted concerning for
pneumonia or atelectasis with associated pleural effusion. Bony
thorax is unremarkable.
IMPRESSION: Large left lower lobe airspace opacity is noted concerning for
pneumonia or atelectasis with associated pleural effusion.

## 2019-10-07 NOTE — Patient Care Conference (Signed)
Inpatient RehabilitationTeam Conference and Plan of Care Update Date: 10/07/2019   Time: 11:15 AM   Patient Name: [name]      Medical Record Number: [id number]  Date of Birth: [birth year] Sex: Male         Room/Bed: 4M07C/4M07C-01 Payor Info: Payor: [employer] / Plan: [employer] MEDICARE / Product Type: *No Product type* /    Admit Date/Time:  09/26/2019  1:13 PM  Primary Diagnosis:  Neurogenic claudication due to lumbar spinal stenosis  Patient Active Problem List   Diagnosis Date Noted  . Cervical myelopathy ([place]) 09/26/2019  . Diabetes mellitus type 2, controlled, with complications ([place]) [id number]  . Obese 09/17/2019  . Thoracic aortic aneurysm ([place]) 09/17/2019  . Bowel incontinence 09/17/2019  . Bladder incontinence 09/17/2019  . Right hemiparesis ([place]) 09/17/2019  . Acute renal failure (ARF) ([place]) 09/17/2019  . Left hemiparesis ([place]) 09/17/2019  . Chronic respiratory failure with hypoxia ([place]) 09/17/2019  . Syncope and collapse 09/17/2019  . Chronic diastolic CHF (congestive heart failure) ([place]) 09/04/2019  . PVD (peripheral vascular disease) ([place]) 02/14/2019  . Essential hypertension 02/13/2019  . Non-insulin treated type 2 diabetes mellitus ([place]) 02/13/2019  . Severe sepsis ([place]) 01/17/2019  . Community acquired pneumonia of right lower lobe of lung 01/17/2019  . Postural urinary incontinence 05/20/2018  . Myelopathy concurrent with and due to spinal stenosis of cervical region ([place]) 05/20/2018  . Myoclonic jerking 05/20/2018  . 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 History of respiratory failure 08/18/2017  . Hypokalemia 08/18/2017  . Leukocytosis 08/18/2017  . Muscle atrophy of lower extremity 08/09/2017  . Bowel and bladder incontinence 08/09/2017  . Right-sided muscle weakness 08/09/2017  . Neuropathy 08/09/2017  . 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/2017  . Abnormality of gait  07/12/2017  . Myoclonic jerkings, 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/2017  . Acquired right foot drop 07/12/2017  . UTI (urinary tract infection) due to Enterococcus 07/12/2017  . Pressure injury of skin 06/21/2017  . Acute cystitis 06/20/2017  . Generalized weakness 06/20/2017  . Dehydration 06/20/2017  . Dyspnea 12/26/2016  . Chronic cough 12/26/2016  . Hypersomnia with sleep apnea 10/30/2016  . CSA (central sleep apnea) 10/30/2016  . Wheezing symptom 10/30/2016  . 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 RLS (restless legs syndrome) 08/25/2014  . Primary gout 08/25/2014  . OSA on CPAP 08/25/2014  . Severe obesity (BMI >= 40) ([place]) 08/25/2014  . Obesity (BMI 30-39.9) 02/18/2013  . 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 S/P TKR (total knee replacement) 11/29/2012  . History of thyroid cancer     Expected Discharge Date: Expected Discharge Date: 10/18/19  Team Members Present: Physician leading conference: Dr. Courtney Heys Care Coodinator Present: Erlene Quan, BSW;Auria Barbra Sarks, LCSWA;Genie Elsye Mccollister, RN, MSN Nurse Present: Dorthula Nettles, RN PT Present: Excell Seltzer, PT OT Present: Willeen Cass, OT;Roanna Epley, COTA SLP Present: Jettie Booze, CF-SLP PPS Coordinator present : Ileana Ladd, Burna Mortimer, SLP     Current Status/Progress Goal Weekly Team Focus  Bowel/Bladder   Incontinent of B/B  To become less incontinent  Timed tolieting   Swallow/Nutrition/ Hydration             ADL's   MIN- MOD A for bathing; supervision- MIN A UB Dressing, total A LB dressing, MOD A bed mobility, stedy for toilet transfers, total A for hygiene, MOD A sit<>stand  supervision overall, min A LB dressing  activity tolerance,  sit<>stand, standing balance, BADL retraining   Mobility   mod to max A bed mobility, mod A assist x 2 to stand, dependent transfers via stedy, Supervision w/c mobility  min A overall  sit to stand, transfers,  endurance   Communication             Safety/Cognition/ Behavioral Observations            Pain   7/10 pain to left ankle; gets norco scheduled and prn, xanaflex and gabapentin  to help remain pain free or lessen pain level  Assess pain level q shift or as needed.   Skin   Has wound to left ankle, abrasions, skin tear and MASD to buttocks  To prevent further breakdown and promote healing.  Assess skin q shift or as needed.    Rehab Goals Patient on target to meet rehab goals: Yes *See Care Plan and progress notes for long and short-term goals.     Barriers to Discharge  Current Status/Progress Possible Resolutions Date Resolved   Nursing                  PT  Medical stability                 OT                  SLP                SW Decreased caregiver support;Lack of/limited family support              Discharge Planning/Teaching Needs:  Pt to d/c to home with his wife who will provide 24/7 care; aide service- 3hrs in the AM/PM  Family education as recommended by therapy   Team Discussion: MD spasticity, unhappy, wants to be on IV steroids, wants baclofen trial, WBC elevated, uA/culture/CXR ordered, low grade fever.  RN BM today, weaker, sounds better up in chair with congestion, inc B/B.  PT struggling, mod/max last week, +2 this week, mod/max bed.  OT min/mod bathing, tot A LB dressing, toilet transfers mod A with steady.   Revisions to Treatment Plan: N/A     Medical Summary Current Status: pt was lethargic but even though that's doing better, struggling with therapy Weekly Focus/Goal: was mod-max; now +2 for transfers; bed mob min assist- now mod-max assist- getting worse  Barriers to Discharge: Decreased family/caregiver support;Home enviroment access/layout;Medical stability;Weight;Other (comments)  Barriers to Discharge Comments: worsening spasticity- will order thoracic and lumbar MRI general anesthesia Possible Resolutions to Barriers: MRIs- move d/c to  5/1   Continued Need for Acute Rehabilitation Level of Care: The patient requires daily medical management by a physician with specialized training in physical medicine and rehabilitation for the following reasons: Direction of a multidisciplinary physical rehabilitation program to maximize functional independence : Yes Medical management of patient stability for increased activity during participation in an intensive rehabilitation regime.: Yes Analysis of laboratory values and/or radiology reports with any subsequent need for medication adjustment and/or medical intervention. : Yes   I attest that I was present, lead the team conference, and concur with the assessment and plan of the team.   Retta Diones 10/07/2019, 9:00 PM   Team conference was held via web/ teleconference due to [place] - 19

## 2019-10-07 NOTE — Progress Notes (Signed)
Pt to wear CPAP at night. RN offered to call RT to put it on, but pt refused. Pt said he was able to put it on himself, which he did so.

## 2019-10-07 NOTE — Plan of Care (Signed)
  Problem: Consults Goal: RH SPINAL CORD INJURY PATIENT EDUCATION Description:  See Patient Education module for education specifics.  Outcome: Progressing Goal: Skin Care Protocol Initiated - if Braden Score 18 or less Description: If consults are not indicated, leave blank or document N/A Outcome: Progressing Goal: Diabetes Guidelines if Diabetic/Glucose > 140 Description: If diabetic or lab glucose is > 140 mg/dl - Initiate Diabetes/Hyperglycemia Guidelines & Document Interventions  Outcome: Progressing   Problem: SCI BOWEL ELIMINATION Goal: RH STG MANAGE BOWEL WITH ASSISTANCE Description: STG Manage Bowel with min Assistance. Outcome: Progressing Goal: RH STG SCI MANAGE BOWEL PROGRAM W/ASSIST OR AS APPROPRIATE Description: STG SCI Manage bowel program w/ min assist or as appropriate. Outcome: Progressing   Problem: SCI BLADDER ELIMINATION Goal: RH STG MANAGE BLADDER WITH ASSISTANCE Description: STG Manage Bladder With min/mod Assistance Outcome: Progressing   Problem: RH SKIN INTEGRITY Goal: RH STG SKIN FREE OF INFECTION/BREAKDOWN Description: Patients skin will remain free from further infection or breakdown with mod assist. Outcome: Progressing Goal: RH STG MAINTAIN SKIN INTEGRITY WITH ASSISTANCE Description: STG Maintain Skin Integrity With mod Assistance. Outcome: Progressing Goal: RH STG ABLE TO PERFORM INCISION/WOUND CARE W/ASSISTANCE Description: STG Able To Perform Incision/Wound Care With total Assistance from caregiver. Outcome: Progressing   Problem: RH SAFETY Goal: RH STG ADHERE TO SAFETY PRECAUTIONS W/ASSISTANCE/DEVICE Description: STG Adhere to Safety Precautions With mod I Assistance/Device. Outcome: Progressing   Problem: RH PAIN MANAGEMENT Goal: RH STG PAIN MANAGED AT OR BELOW PT'S PAIN GOAL Description: < 4 Outcome: Progressing

## 2019-10-07 NOTE — Plan of Care (Signed)
  Problem: Consults Goal: RH SPINAL CORD INJURY PATIENT EDUCATION Description:  See Patient Education module for education specifics.  Outcome: Progressing Goal: Skin Care Protocol Initiated - if Braden Score 18 or less Description: If consults are not indicated, leave blank or document N/A Outcome: Progressing   Problem: SCI BOWEL ELIMINATION Goal: RH STG MANAGE BOWEL WITH ASSISTANCE Description: STG Manage Bowel with min Assistance. Outcome: Progressing Goal: RH STG SCI MANAGE BOWEL PROGRAM W/ASSIST OR AS APPROPRIATE Description: STG SCI Manage bowel program w/ min assist or as appropriate. Outcome: Progressing   Problem: SCI BLADDER ELIMINATION Goal: RH STG MANAGE BLADDER WITH ASSISTANCE Description: STG Manage Bladder With min/mod Assistance Outcome: Progressing   Problem: RH SKIN INTEGRITY Goal: RH STG SKIN FREE OF INFECTION/BREAKDOWN Description: Patients skin will remain free from further infection or breakdown with mod assist. Outcome: Progressing Goal: RH STG MAINTAIN SKIN INTEGRITY WITH ASSISTANCE Description: STG Maintain Skin Integrity With mod Assistance. Outcome: Progressing Goal: RH STG ABLE TO PERFORM INCISION/WOUND CARE W/ASSISTANCE Description: STG Able To Perform Incision/Wound Care With total Assistance from caregiver. Outcome: Progressing   Problem: RH SAFETY Goal: RH STG ADHERE TO SAFETY PRECAUTIONS W/ASSISTANCE/DEVICE Description: STG Adhere to Safety Precautions With mod I Assistance/Device. Outcome: Progressing   Problem: RH PAIN MANAGEMENT Goal: RH STG PAIN MANAGED AT OR BELOW PT'S PAIN GOAL Description: < 4 Outcome: Progressing

## 2019-10-07 NOTE — Progress Notes (Signed)
Pt LBM was 4/16. Pt has scheduled miralax, but refused evening dose. Pt had PRN for sorbitol, however also refused.

## 2019-10-07 NOTE — Progress Notes (Signed)
Physical Therapy Session Note  Patient Details  Name: [name] MRN: [id number] Date of Birth: [birth year]  Today's Date: 10/07/2019 PT Individual Time: 0900-1015; 1105-1205 PT Individual Time Calculation (min): 75 min and 30 min  Short Term Goals: Week 2:  PT Short Term Goal 1 (Week 2): Pt will complete least restrictive transfer with mod A consistently PT Short Term Goal 2 (Week 2): Pt will perform sit to stand wtih mod A consistently PT Short Term Goal 3 (Week 2): Pt will ambulate x 10 ft with LRAD  Skilled Therapeutic Interventions/Progress Updates:    Session 1: Pt received seated in bed, agreeable to PT session. Pt reports ongoing pain in L ankle, 3/10. Pt also with increase in BLE spasms this AM, reports not receiving his Baclofen yet this AM. RN able to provide medication during therapy session. Pt with increased alertness this date, improved from yesterday. Pt on room air at rest, SpO2 90-94%. Supine to sit with max A for BLE management and trunk control. Pt with improved ability to maintain sitting balance with close SBA this date. Seated SpO2 89-91% while on room air following bed mobility. Sit to stand with max A x 2 to stedy. Pt reporting urge to have a BM, requesting to transfer to toilet. Toilet transfer via stedy. Pt has incontinence of bowel in brief prior to making it to toilet then also able to continently void on toilet. Pt is total A x 3 to stand from toilet seat. Transferred back to bed via stedy. Sit to supine assist x 2 for trunk control and BLE management. Pt is mod A for rolling R/L for dependent pericare, clothing change, and donning of new brief. Pt is assist x 2 to sit up in bed with HOB elevated and use of bedrails in order to don a new, clean shirt. Pt left seated in bed with needs in reach, bed alarm in place at end of session.  Session 2: Pt received seated in bed, agreeable to PT session. Pt reports ongoing pain and spasms in LLE, not rated. Pt agreeable to get  up to w/c this session. Pt found to be incontinent of urine in brief (condom catheter doffed during last session due to being soiled following incontinent BM, RN aware and ordering more condom catheters). Pt is dependent for pericare and donning of new brief and pants. Supine to sit with mod A for trunk control and some LE management. Slide board transfer bed to w/c with max A with max cueing for sequencing. Pt left seated in w/c in room with needs in reach, quick release belt and chair alarm in place at end of session.  Therapy Documentation Precautions:  Precautions Precautions: Fall Restrictions Weight Bearing Restrictions: No    Therapy/Group: Individual Therapy   Excell Seltzer, PT, DPT  10/07/2019, 10:39 AM

## 2019-10-07 NOTE — Progress Notes (Signed)
Social Work Patient ID: [name], male   DOB: 1940-07-30, 79 y.o.   MRN: [id number]   SW met with pt in room to provide updates from team conference, and change in d.c date from 10/11/2019-10/18/2019. Pt in agreement.   SW called pt wife [name] [phone]) to provide same updates. Pt wife had questions about if extension has been approved by insurance. SW informed there will be updates if there are any issues. No further questions/concerns reported.    Loralee Pacas, MSW, [place] Office: [phone] Cell: [phone] Fax: [phone]

## 2019-10-07 NOTE — Progress Notes (Signed)
[name] PHYSICAL MEDICINE & REHABILITATION PROGRESS NOTE    Subjective: Pt reports nauseated last night- had migraine/bad HA- staff only gave him tylenol, not norco or Imetrex as ordered per pt.  Also frustrated that resp therapy didn't come to hook up to CPAP.  Refused sorbitol and miralax even though LBM was 4/16-  Wants claritin and flonase stopped- doesn't think helping.   Also upset overall about spasticity- says isn't a candidate for back surgery, but doesn't know why "going backwards" from when was on IV steroids- when spasticity had stopped.  Explained our 2 choices aren't surgery or steroids, since can't stay on steroids long term- due to osteoporosis, BG issues, etc. And pt says can't have back surgery.   Did discuss ITB pump possibly- not sure can get ITB trial inpt- will see if can call Dr Maryjean Ka to see if possible. Explained all this to pt.   Also WBC yesterday 14.7- off prednisone.    ROS:  Pt denies SOB, abd pain, CP, N/V/C/D, and vision changes   Objective:   No results found. Recent Labs    10/06/19 0537  WBC 14.7*  HGB 12.8*  HCT 39.8  PLT 163   Recent Labs    10/06/19 0537  NA 138  K 4.4  CL 99  CO2 28  GLUCOSE 104*  BUN 17  CREATININE 0.79  CALCIUM 8.7*    Intake/Output Summary (Last 24 hours) at 10/07/2019 1009 Last data filed at 10/07/2019 0548 Gross per 24 hour  Intake -  Output 2250 ml  Net -2250 ml     Physical Exam: Vital Signs Blood pressure (!) 142/83, pulse 78, temperature 98.3 F (36.8 C), temperature source Oral, resp. rate 19, height 5\' 7"  (1.702 m), weight 97.6 kg, SpO2 95 %. Constitutional:sitting up in bed; appropriate, but frustrated, coarse sounding, juicy cough, NAD HENT:  Conjugate gaze  Cardiovascular: RRR Respiratory:before nebs- still juicy cough, coarse breath sounds- but on auscultation, was less wheezy than normal [id number], NT, ND, (+)BS  Musculoskeletal:  Cervical back: Normal range of motion.   Neurological:Ox3 .CN exam intact. UE grossly 5/5 with trace to 1+ DTR's. RLE 4/5 prox to distal with pain inhibition. LLE: 2+ to 3+ prox to distal with more pain inhibition. Sensation to LT/temperature decreased in LLE up to nearly umbilicus. DTR's in LE surprisingly brisk at 2+ patella/achilles.  LLE kept jumping ~ 10 in 10 minutes today- is worse since off prednisone.   Skin: laceration right index finger, abrasions healing well on left knee,  L lateral malleolus dime sized- pink, a little red around wound- ~ 2-27mm deep; little slough- no change on exam PRAFOs in place, but since frog legged, lateral maleolus on bed . Buttocks slightly red, moist, no breakdown.  Psychiatric:frustrated about spasticity  Assessment/Plan: 1. Functional deficits secondary to lumbar spinal stenosis with neurogenic claudication which require 3+ hours per day of interdisciplinary therapy in a comprehensive inpatient rehab setting.  Physiatrist is providing close team supervision and 24 hour management of active medical problems listed below.  Physiatrist and rehab team continue to assess barriers to discharge/monitor patient progress toward functional and medical goals  Care Tool:  Bathing    Body parts bathed by patient: Right arm, Left arm, Chest, Abdomen, Front perineal area, Right upper leg, Left upper leg, Right lower leg, Left lower leg, Face   Body parts bathed by helper: Buttocks     Bathing assist Assist Level: Minimal Assistance - Patient > 75%     Upper  Body Dressing/Undressing Upper body dressing   What is the patient wearing?: Pull over shirt    Upper body assist Assist Level: Supervision/Verbal cueing    Lower Body Dressing/Undressing Lower body dressing      What is the patient wearing?: Pants     Lower body assist Assist for lower body dressing: Total Assistance - Patient < 25%     Toileting Toileting    Toileting assist Assist for toileting: Dependent - Patient 0%      Transfers Chair/bed transfer  Transfers assist     Chair/bed transfer assist level: Dependent - mechanical lift(stedy; MAX A +2 with stedy)     Locomotion Ambulation   Ambulation assist   Ambulation activity did not occur: Safety/medical concerns  Assist level: 2 helpers Assistive device: Parallel bars Max distance: 5'   Walk 10 feet activity   Assist  Walk 10 feet activity did not occur: Safety/medical concerns        Walk 50 feet activity   Assist Walk 50 feet with 2 turns activity did not occur: Safety/medical concerns         Walk 150 feet activity   Assist Walk 150 feet activity did not occur: Safety/medical concerns         Walk 10 feet on uneven surface  activity   Assist Walk 10 feet on uneven surfaces activity did not occur: Safety/medical concerns         Wheelchair     Assist Will patient use wheelchair at discharge?: Yes Type of Wheelchair: Manual    Wheelchair assist level: Supervision/Verbal cueing Max wheelchair distance: 150'    Wheelchair 50 feet with 2 turns activity    Assist        Assist Level: Supervision/Verbal cueing   Wheelchair 150 feet activity     Assist      Assist Level: Supervision/Verbal cueing   Blood pressure (!) 142/83, pulse 78, temperature 98.3 F (36.8 C), temperature source Oral, resp. rate 19, height 5\' 7"  (1.702 m), weight 97.6 kg, SpO2 95 %.    Medical Problem List and Plan: 1.Decreased functional ability due to lumbar stenosis/radiculopathy with neurogenic claudication. MRI's also suggest (albeit with motion artifact) distal thoracic involvement as well.  Exam is more c/w this too.              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             -consider re-imaging of  thoracic spine (perhaps with sedation) to better look at thoracic cord.  4/13- will give pt PRAFOs B/L to prevent heel/ankle skin breakdown at night  4/15- ordered new PRAFOS with kickstands- since original ones didn't have them- they are usually standard.   4/16- PRAFOs with kickstands came- not helping because pt sits with hips externally rotated  4/18- sitting frog legged= can't fix issue with this position             -Continue CIR PT, OT -patient may shower -ELOS/Goals: 14-18 days, supervision to min assist with PT,OT 2. Antithrombotics: -DVT/anticoagulation:Lovenox. Check vascular study  4/12- Dopplers (-)- con't lovenox -antiplatelet therapy: Aspirin 81 mg daily 3. Pain Management:Baclofen 15 mg 3 times daily, Neurontin 100 mg nightly, OxyContin 10 mg every 12 hours, hydrocodone as needed  as well as Imitrex for headaches -adjust regimen as needed based on efficacy, wean as possible             -oral steroids to taper  -pain present in therapy yesterday and at rest this morning at left ankle wound site, wound assessed and healing well. Has not been taking hydrocodone; one dose administered this morning.   4/13- will add Zanaflex 2 mg TID for spasticity  4/14- spasticity improved per pt  4/15- spasticity back to being a problem- will order Valium 5 mg BID prn for pt.    4/18- discussed dantrolene- will try 50 mg QHS- if makes too sleepy, can stop it.   4/19: Patient was very sleepy this morning. Will stop Dantrolene.  4/20- discussed ITB pump and possible trial- will call and see if at all possible to do while here. On doses of meds can tolerate, due to sedation, but cannot increase. Last took Valium yesterday AM.  4. Mood:Provide emotional support -antipsychotic agents: N/A 5. Neuropsych: This patientiscapable of making decisions on hisown behalf. 6. Skin/Wound Care:place steristrips over right index finger lac.                           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    4/15- ordered new PRAFOs to keep feet from turning out/rotating out  4/16- PRAFOs came with kickstands                         -foam dressing left knee 7. Fluids/Electrolytes/Nutrition:Routine in and outs with follow-up chemistries. Diet changed to regular as per patient's preference. He states he would not follow current diet at home as is unappealing to him. I discussed that our goal is to simulate home environment as medications are adjusted according to current labs/vitals. I discussed that goal of heart healthy diet is to decrease sodium and saturated fat to protect the heart and circulatory system. Discussed his current diet and advised that he cut back on sugary juices, which he likes. Recommended replacing with water. He asked about his [employer] which he and his wife drink a lot of at home. Advised that despite it having no calories, it has a lot of processed ingredients and his goal should be to minimize processed foods. He expressed understanding. Continue nutritional counseling.   4/19: Electrolytes stable on BMP today. Calcium low at 8.7. Will start calcium supplement.  8. Chronic restless leg syndrome. Sinemet 10-100 mg twice daily. 9. 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 OSA with CPAP/2 L chronic oxygen at home. Continue inhalers 11. 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 Follow-up outpatient 12. Diabetes mellitus. Hemoglobin A1c 5.8. SSI. Changed to [facility] HS for CBG and SSI  4/17- pt refused to get BGs checked- stopped 4/12- A1c 5.8- 13. Neurogenic bowel and bladder. Check PVR  4/12- no BM since Friday- if no BM overnight,  will give Mg citrate and enema tomorrow.   4/13- had extra large BM last night- con't regimen  4/19- patient denies constipation. 14. Hypothyroidism. Synthroid 15. Chronic diastolic congestive heart failure. Monitor for any signs of fluid overload. Check regular weights   Filed Weights  10/05/19 0429 10/06/19 0509 10/07/19 0546  Weight: 98.3 kg 98.6 kg 97.6 kg   4/13- will monitor for trends  4/14- up 0.9 kg in 1 day- if goes up again, will check BNP  4/15- Weight up another 1 kg- will check BNP and labs tomorrow  4/17- Weight back down slightly- will monitor- BNP only 45  4/18- weight down 1/2 kg- will con't regimen 16. Hyperlipidemia. Zocor 17. Code status: Changed to DNR as per patient's wishes. 18. Leukocytosis   4/12- on prednisone taper- the cause since afebrile and no Sx's of illness-con't steroid taper  4/20- WBC back up to 14.7- off prednisone-Tm 99.9 yesterday- will check U/A, U Cx,  and CXR  19. Wheezing  4/13- asked pt to ask for prn albuterol and added Flonase for allergies/cough  4/14- will add Albuterol nebs qam in addition to prn, since sounds wheezing every AM.  4/15- still wheezing- hadn't gotten albuterol yet this AM- will add allergy meds to see if helps  4/16- still wheezy but better.   4/17- after nebs in AM, sounds much better- encouraged pt to use prn Nebs  Of note, spent 40 minutes on total care today, trying to track down ITB trial and talking to pt about issues as detailed above and f/u on U/A and Cx and CXR   LOS: 11 days A FACE TO FACE EVALUATION WAS PERFORMED  Abed Schar 10/07/2019, 10:09 AM

## 2019-10-07 NOTE — Progress Notes (Signed)
Occupational Therapy Session Note  Patient Details  Name: [name] MRN: [id number] Date of Birth: Apr 04, 1941  Today's Date: 10/07/2019 OT Individual Time: [id number] OT Individual Time Calculation (min): 45 min    Short Term Goals: Week 2:  OT Short Term Goal 1 (Week 2): STG=LTG secondary to ELOS  Skilled Therapeutic Interventions/Progress Updates:  Pt received seated in w/c agreeable to OT intervention. Vitals checked at start of session with pt on RA O2 87% needing cues for pursed lip breathing ( PLB) with O2 rebounding to 91% at rest. Pt complete w/c propulsion ~ 20 feet with supervision with OTA taking over for time mgmt. Session focus on seated therapeutic activities to increase strength and ROM for BADL participation. Pt complete seated sh flexion with 1 lb dowel rod 2 mins to toss ball back and forth with OTA. Pt able to reach out of BOS to retrieve ball from various heights and shoot into basketball hoop needing verbal cues to incorporate PLB into task. Pt completed 3 mins of zoom ball seated in w/c needing cues for body mechanics related to execution of task. Vitals monitored during all therapeutic activities with O2 dropping to 88% needing ~ 30 seconds for O2 to rebound back to >90%. Pt complete household distance w/c propulsion back to room where pt left up in w/c with all needs within reach and safety belt activated.   Therapy Documentation Precautions:  Precautions Precautions: Fall Restrictions Weight Bearing Restrictions: No General: General OT Amount of Missed Time: 30 Minutes Vital Signs:  Pain:  Pt reports no pain during session.   Therapy/Group: Individual Therapy  [name] 10/07/2019, 3:45 PM

## 2019-10-07 NOTE — Progress Notes (Signed)
Occupational Therapy Session Note  Patient Details  Name: [name] MRN: [id number] Date of Birth: Nov 12, 1940  Today's Date: 10/07/2019 OT Individual Time: 1330-1415 OT Individual Time Calculation (min): 45 min  and Today's Date: 10/07/2019 OT Missed Time: 30 Minutes Missed Time Reason: Other (comment)(eating lunch)   Short Term Goals: Week 2:  OT Short Term Goal 1 (Week 2): STG=LTG secondary to ELOS  Skilled Therapeutic Interventions/Progress Updates:    Pt missed 30 mins skilled OT services at beginning of scheduled time at pt's request to finish eating lunch.  OT intervention with focus on w/c mobility and BUE therex to increased activity tolerance and BUE strength.  Pt much more alert today with clear speech. Pt states he still feels weak but much improved from previous day. W/c moblity within unit for total of 150' with rest breaks X 3. BUE therex on [employer] arm bike 6 mins X 2 level 2 in opposite directions.  O2 sats>93% on RA throughout session.  Pt remained in w/c with all needs within reach and belt alarm activated.   Therapy Documentation Precautions:  Precautions Precautions: Fall Restrictions Weight Bearing Restrictions: No General: General OT Amount of Missed Time: 30 Minutes   Pain:  Pt with no c/o pain this afternoon   Therapy/Group: Individual Therapy  [name] 10/07/2019, 2:17 PM

## 2019-10-07 NOTE — Progress Notes (Signed)
Patient going for IV sedation MRI tomorrow 10/08/19 in the AM. Spoke with IV team and IV will be place in the 0600 hour on 10/08/19. Attempted to call Anesthesia to find out the time of the procedure, however the patient had not been scheduled as of yet. Will have the AM nurse on 10/08/19 call the OR front desk at [phone] to find out his scheduled time.

## 2019-10-08 ENCOUNTER — Inpatient Hospital Stay (HOSPITAL_COMMUNITY): Payer: Medicare Other | Admitting: Registered Nurse

## 2019-10-08 ENCOUNTER — Ambulatory Visit: Payer: Medicare Other

## 2019-10-08 ENCOUNTER — Inpatient Hospital Stay (HOSPITAL_COMMUNITY): Payer: Medicare Other

## 2019-10-08 ENCOUNTER — Inpatient Hospital Stay (HOSPITAL_COMMUNITY): Payer: Medicare Other | Admitting: Physical Therapy

## 2019-10-08 ENCOUNTER — Inpatient Hospital Stay (HOSPITAL_COMMUNITY)
Admission: RE | Admit: 2019-10-08 | Payer: Medicare Other | Source: Ambulatory Visit | Admitting: Physical Medicine and Rehabilitation

## 2019-10-08 ENCOUNTER — Encounter (HOSPITAL_COMMUNITY)
Admission: RE | Disposition: A | Discharge status: Other Acute Inpatient Hospital | Payer: Self-pay | Source: Other Acute Inpatient Hospital | Attending: Physical Medicine and Rehabilitation

## 2019-10-08 LAB — COMPREHENSIVE METABOLIC PANEL
ALT: 7 U/L (ref 0–44)
AST: 14 U/L — ABNORMAL LOW (ref 15–41)
Albumin: 2.7 g/dL — ABNORMAL LOW (ref 3.5–5.0)
Alkaline Phosphatase: 57 U/L (ref 38–126)
Anion gap: 12 (ref 5–15)
BUN: 16 mg/dL (ref 8–23)
CO2: 27 mmol/L (ref 22–32)
Calcium: 8.3 mg/dL — ABNORMAL LOW (ref 8.9–10.3)
Chloride: 99 mmol/L (ref 98–111)
Creatinine, Ser: 0.91 mg/dL (ref 0.61–1.24)
GFR calc Af Amer: >60 mL/min (ref >60)
GFR calc non Af Amer: >60 mL/min (ref >60)
Glucose, Bld: 139 mg/dL — ABNORMAL HIGH (ref 70–99)
Potassium: 3.9 mmol/L (ref 3.5–5.1)
Sodium: 138 mmol/L (ref 135–145)
Total Bilirubin: 0.8 mg/dL (ref 0.3–1.2)
Total Protein: 5.8 g/dL — ABNORMAL LOW (ref 6.5–8.1)

## 2019-10-08 LAB — CBC
HCT: 37.7 % — ABNORMAL LOW (ref 39.0–52.0)
Hemoglobin: 12.2 g/dL — ABNORMAL LOW (ref 13.0–17.0)
MCH: 29.3 pg (ref 26.0–34.0)
MCHC: 32.4 g/dL (ref 30.0–36.0)
MCV: 90.6 fL (ref 80.0–100.0)
Platelets: 171 10*3/uL (ref 150–400)
RBC: 4.16 MIL/uL — ABNORMAL LOW (ref 4.22–5.81)
RDW: 14.7 % (ref 11.5–15.5)
WBC: 18.6 10*3/uL — ABNORMAL HIGH (ref 4.0–10.5)
nRBC: 0 % (ref 0.0–0.2)

## 2019-10-08 SURGERY — MRI WITH ANESTHESIA
Anesthesia: General

## 2019-10-08 MED ORDER — VANCOMYCIN HCL 750 MG/150ML IV SOLN
750.0000 mg | Freq: Two times a day (BID) | INTRAVENOUS | Status: AC
  Administered 2019-10-08 – 2019-10-14 (×13): 750 mg via INTRAVENOUS
  Filled 2019-10-08 (×13): qty 150

## 2019-10-08 MED ORDER — ACETAMINOPHEN 325 MG PO TABS
650.0000 mg | ORAL_TABLET | Freq: Once | ORAL | Status: AC
  Administered 2019-10-08: 650 mg via ORAL
  Filled 2019-10-08: qty 2

## 2019-10-08 MED ORDER — GUAIFENESIN ER 600 MG PO TB12
1200.0000 mg | ORAL_TABLET | Freq: Two times a day (BID) | ORAL | Status: DC
  Administered 2019-10-08 – 2019-10-28 (×41): 1200 mg via ORAL
  Filled 2019-10-08 (×41): qty 2

## 2019-10-08 MED ORDER — VANCOMYCIN HCL 1250 MG/250ML IV SOLN
1250.0000 mg | Freq: Once | INTRAVENOUS | Status: AC
  Administered 2019-10-08: 1250 mg via INTRAVENOUS
  Filled 2019-10-08: qty 250

## 2019-10-08 MED ORDER — SODIUM CHLORIDE 0.9 % IV SOLN
2.0000 g | Freq: Three times a day (TID) | INTRAVENOUS | Status: AC
  Administered 2019-10-08 – 2019-10-15 (×21): 2 g via INTRAVENOUS
  Filled 2019-10-08 (×23): qty 2

## 2019-10-08 NOTE — Progress Notes (Signed)
Physical Therapy Session Note  Patient Details  Name: [name] MRN: [id number] Date of Birth: [birth year]  Today's Date: 10/08/2019 PT Missed Time: 45 min Missed Time Reason: patient illness (fever, low O2 sat)     Short Term Goals: Week 2:  PT Short Term Goal 1 (Week 2): Pt will complete least restrictive transfer with mod A consistently PT Short Term Goal 2 (Week 2): Pt will perform sit to stand wtih mod A consistently PT Short Term Goal 3 (Week 2): Pt will ambulate x 10 ft with LRAD  Skilled Therapeutic Interventions/Progress Updates:  Attempted to see patient for scheduled therapy session this PM. Pt not medically stable to be seen this PM, treatment deferred. Will follow up per POC as pt is safe and able.  Therapy Documentation Precautions:  Precautions Precautions: Fall Restrictions Weight Bearing Restrictions: No    Therapy/Group: Individual Therapy   Excell Seltzer, PT, DPT  10/08/2019, 7:37 AM

## 2019-10-08 NOTE — Progress Notes (Addendum)
Patient got a fever this morning and O2 was in the 80'S when he went down for MRI. Patient was sent back due to condition. Patient vitals taken and noted. Helyn Numbers , PA aware and seen patient with new orders noted. Patient is running antibiotics at this time. MRI called and stated they are not able to sedate him at this time for the MRI.  IV antibiotics were not available before patient went down for MRI this morning.   Patient is resting well at this time. Will continue to monitor.

## 2019-10-08 NOTE — Progress Notes (Signed)
Pharmacy Antibiotic Note  [name] is a 79 y.o. male admitted on 09/26/2019 with pneumonia and possible UTI.  Pharmacy has been consulted for vancomycin and cefepime dosing.  Patient feeling poorly this morning, refused labs so do not have updated WBC. Last 4/19 was 14.7 and up trending. Afebrile. CXR with large LLL opacity. MRI thoracic/lumbar spine pending. Has been  If need vancomycin labs, may need to reiterate importance of levels to patient so he will not refuse them.   Plan: Vancomycin 1250mg  x1 load then 750mg  Q12hr Cefepime 2g Q8hr Monitor cultures, MRSA screen, clinical status, renal fx Narrow abx as able and f/u duration    Height: 5\' 7"  (170.2 cm) Weight: 97.6 kg (215 lb 2.7 oz) IBW/kg (Calculated) : 66.1  Temp (24hrs), Avg:97.8 F (36.6 C), Min:97.8 F (36.6 C), Max:97.8 F (36.6 C)  Recent Labs  Lab 10/03/19 0557 10/06/19 0537  WBC 11.8* 14.7*  CREATININE 0.93 0.79    Estimated Creatinine Clearance: 84.7 mL/min (by C-G formula based on SCr of 0.79 mg/dL).    No Known Allergies  Antimicrobials this admission: Vanc 4/21>> Cefe 4/21>>    Microbiology results: 4/20 UCx: pend  4/20 UA: large Leuk, WBC >50 4/20 MRSA PCR: Pend 3/31 BCx ngtd 3/31 UCx + 30K corynebacter   Thank you for allowing pharmacy to be a part of this patient's care.  Benetta Spar, PharmD, BCPS, BCCP Clinical Pharmacist  Please check AMION for all [place] phone numbers After 10:00 PM, call [place] [phone]

## 2019-10-08 NOTE — Progress Notes (Signed)
Patient fever is coming down and patient is more alert at this time. Patient asked to be readjusted in the bed while staff was present. Patient also drank some water for staff

## 2019-10-08 NOTE — Progress Notes (Signed)
Report received from [facility] on 4N.report was VSS and O2  91 no O2 . Pt dx with pneumonia no antibiotics were started.Dr Alvis Lemmings  Called Dr Courtney Heys. I took temp and it was 102.7 sats 89. Surgery cancelled until pt was stable. I reported this back to [place] who stated she didn't have time to start the Antibiotics.

## 2019-10-08 NOTE — Progress Notes (Signed)
[place] PHYSICAL MEDICINE & REHABILITATION PROGRESS NOTE    Subjective:  Pt refused blood work this AM- to see what his WBC was- Large left airspace opacity on Lower lobe- concerning for pneumonia. Martin Majestic back and spoke to pt- wants ot start ABX now- feeling "worse" than normal and very cold.   ROS:  Pt denies SOB, abd pain, CP, N/V/C/D, and vision changes   Objective:   DG CHEST PORT 1 VIEW  Result Date: 10/07/2019 CLINICAL DATA:  Leukocytosis. EXAM: PORTABLE CHEST 1 VIEW COMPARISON:  September 17, 2019. FINDINGS: Stable cardiomegaly. No pneumothorax is noted. Right lung is clear. Large left lower lobe airspace opacity is noted concerning for pneumonia or atelectasis with associated pleural effusion. Bony thorax is unremarkable. IMPRESSION: Large left lower lobe airspace opacity is noted concerning for pneumonia or atelectasis with associated pleural effusion. Electronically Signed   By: Marijo Conception M.D.   On: 10/07/2019 12:51   Recent Labs    10/06/19 0537  WBC 14.7*  HGB 12.8*  HCT 39.8  PLT 163   Recent Labs    10/06/19 0537  NA 138  K 4.4  CL 99  CO2 28  GLUCOSE 104*  BUN 17  CREATININE 0.79  CALCIUM 8.7*    Intake/Output Summary (Last 24 hours) at 10/08/2019 0801 Last data filed at 10/07/2019 1302 Gross per 24 hour  Intake 300 ml  Output 700 ml  Net -400 ml     Physical Exam: Vital Signs Blood pressure (!) 146/70, pulse 81, temperature 97.8 F (36.6 C), resp. rate 19, height 5\' 7"  (1.702 m), weight 97.6 kg, SpO2 91 %. Constitutional:sitting up in bed; slightly less coarse cough, NAD; c/o freezing HENT:  Conjugate gaze  Cardiovascular: RRR Respiratory:before nebs- juicy audibly; wheezing throughout- slightly decreased at bases B/L-  [id number], NT, ND, (+)BS  Musculoskeletal:  Cervical back: Normal range of motion.  Neurological:Ox3 .CN exam intact. UE grossly 5/5 with trace to 1+ DTR's. RLE 4/5 prox to distal with pain inhibition. LLE:  2+ to 3+ prox to distal with more pain inhibition. Sensation to LT/temperature decreased in LLE up to nearly umbilicus. DTR's in LE surprisingly brisk at 2+ patella/achilles.  LLE jumping throughout interview- spasms  Skin: laceration right index finger, abrasions healing well on left knee,  L lateral malleolus dime sized- pink, a little red around wound- ~ 2-45mm deep; little slough- no change on exam PRAFOs in place, but since frog legged, lateral maleolus on bed . Buttocks slightly red, moist, no breakdown.  Psychiatric:frustrated about spasticity- refused labs this AM  Assessment/Plan: 1. Functional deficits secondary to lumbar spinal stenosis with neurogenic claudication which require 3+ hours per day of interdisciplinary therapy in a comprehensive inpatient rehab setting.  Physiatrist is providing close team supervision and 24 hour management of active medical problems listed below.  Physiatrist and rehab team continue to assess barriers to discharge/monitor patient progress toward functional and medical goals  Care Tool:  Bathing    Body parts bathed by patient: Right arm, Left arm, Chest, Abdomen, Front perineal area, Right upper leg, Left upper leg, Right lower leg, Left lower leg, Face   Body parts bathed by helper: Buttocks     Bathing assist Assist Level: Minimal Assistance - Patient > 75%     Upper Body Dressing/Undressing Upper body dressing   What is the patient wearing?: Pull over shirt    Upper body assist Assist Level: Supervision/Verbal cueing    Lower Body Dressing/Undressing Lower  body dressing      What is the patient wearing?: Pants     Lower body assist Assist for lower body dressing: Total Assistance - Patient < 25%     Toileting Toileting    Toileting assist Assist for toileting: Dependent - Patient 0%     Transfers Chair/bed transfer  Transfers assist     Chair/bed transfer assist level: Dependent - mechanical lift(stedy)      Locomotion Ambulation   Ambulation assist   Ambulation activity did not occur: Safety/medical concerns  Assist level: 2 helpers Assistive device: Parallel bars Max distance: 5'   Walk 10 feet activity   Assist  Walk 10 feet activity did not occur: Safety/medical concerns        Walk 50 feet activity   Assist Walk 50 feet with 2 turns activity did not occur: Safety/medical concerns         Walk 150 feet activity   Assist Walk 150 feet activity did not occur: Safety/medical concerns         Walk 10 feet on uneven surface  activity   Assist Walk 10 feet on uneven surfaces activity did not occur: Safety/medical concerns         Wheelchair     Assist Will patient use wheelchair at discharge?: Yes Type of Wheelchair: Manual    Wheelchair assist level: Supervision/Verbal cueing Max wheelchair distance: 150'    Wheelchair 50 feet with 2 turns activity    Assist        Assist Level: Supervision/Verbal cueing   Wheelchair 150 feet activity     Assist      Assist Level: Supervision/Verbal cueing   Blood pressure (!) 146/70, pulse 81, temperature 97.8 F (36.6 C), resp. rate 19, height 5\' 7"  (1.702 m), weight 97.6 kg, SpO2 91 %.    Medical Problem List and Plan: 1.Decreased functional ability due to lumbar stenosis/radiculopathy with neurogenic claudication. MRI's also suggest (albeit with motion artifact) distal thoracic involvement as well.  Exam is more c/w this too.              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             -consider re-imaging of thoracic spine (perhaps with sedation) to better look at thoracic cord.  4/13- will give pt PRAFOs B/L to prevent heel/ankle skin breakdown at night  4/15- ordered new PRAFOS with  kickstands- since original ones didn't have them- they are usually standard.   4/16- PRAFOs with kickstands came- not helping because pt sits with hips externally rotated  4/18- sitting frog legged= can't fix issue with this position             -Continue CIR PT, OT -patient may shower -ELOS/Goals: 14-18 days, supervision to min assist with PT,OT 2. Antithrombotics: -DVT/anticoagulation:Lovenox. Check vascular study  4/12- Dopplers (-)- con't lovenox -antiplatelet therapy: Aspirin 81 mg daily 3. Pain Management:Baclofen 15 mg 3 times daily, Neurontin 100 mg nightly, OxyContin 10 mg every 12 hours, hydrocodone as needed as well as Imitrex for headaches -adjust regimen as needed based on efficacy, wean as possible             -oral steroids to taper  -pain present in therapy yesterday and at  rest this morning at left ankle wound site, wound assessed and healing well. Has not been taking hydrocodone; one dose administered this morning.   4/13- will add Zanaflex 2 mg TID for spasticity  4/14- spasticity improved per pt  4/15- spasticity back to being a problem- will order Valium 5 mg BID prn for pt.    4/18- discussed dantrolene- will try 50 mg QHS- if makes too sleepy, can stop it.   4/19: Patient was very sleepy this morning. Will stop Dantrolene.  4/20- discussed ITB pump and possible trial- will call and see if at all possible to do while here. On doses of meds can tolerate, due to sedation, but cannot increase. Last took Valium yesterday AM.  4/21- discussed keppra- explained is not FDA approved for spasticity- will wait on him to get MRI- is ordered  4. Mood:Provide emotional support -antipsychotic agents: N/A 5. Neuropsych: This patientiscapable of making decisions on hisown behalf. 6. Skin/Wound Care:place steristrips over right index finger lac.                          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    4/15- ordered new PRAFOs to keep feet from turning out/rotating out  4/16- PRAFOs came with kickstands                         -foam dressing left knee 7. Fluids/Electrolytes/Nutrition:Routine in and outs with follow-up chemistries. Diet changed to regular as per patient's preference. He states he would not follow current diet at home as is unappealing to him. I discussed that our goal is to simulate home environment as medications are adjusted according to current labs/vitals. I discussed that goal of heart healthy diet is to decrease sodium and saturated fat to protect the heart and circulatory system. Discussed his current diet and advised that he cut back on sugary juices, which he likes. Recommended replacing with water. He asked about his [employer] which he and his wife drink a lot of at home. Advised that despite it having no calories, it has a lot of processed ingredients and his goal should be to minimize processed foods. He expressed understanding. Continue nutritional counseling.   4/19: Electrolytes stable on BMP today. Calcium low at 8.7. Will start calcium supplement.  8. Chronic restless leg syndrome. Sinemet 10-100 mg twice daily. 9. 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 OSA with CPAP/2 L chronic oxygen at home. Continue inhalers 11. 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 Follow-up outpatient 12. Diabetes mellitus. Hemoglobin A1c 5.8. SSI. Changed to [facility] for CBG and SSI  4/17- pt refused to get BGs checked- stopped 4/12- A1c 5.8- 13. Neurogenic bowel and bladder. Check PVR  4/12- no BM since Friday- if no BM overnight, will give Mg citrate and enema tomorrow.   4/13- had extra  large BM last night- con't regimen  4/19- patient denies constipation.  4/21- last BM last night- refused bowel meds 14. Hypothyroidism. Synthroid 15. Chronic diastolic congestive heart failure. Monitor for any signs of fluid overload. Check regular weights   Filed Weights   10/05/19 0429 10/06/19 0509 10/07/19 0546  Weight: 98.3 kg  98.6 kg 97.6 kg   4/13- will monitor for trends  4/14- up 0.9 kg in 1 day- if goes up again, will check BNP  4/15- Weight up another 1 kg- will check BNP and labs tomorrow  4/17- Weight back down slightly- will monitor- BNP only 45  4/18- weight down 1/2 kg- will con't regimen  4/21- weight stable- con't meds 16. Hyperlipidemia. Zocor 17. Code status: Changed to DNR as per patient's wishes. 18. Leukocytosis   4/12- on prednisone taper- the cause since afebrile and no Sx's of illness-con't steroid taper  4/20- WBC back up to 14.7- off prednisone-Tm 99.9 yesterday- will check U/A, U Cx,  and CXR  4/21-  U/A somewhat (+) and CXR also shows likely pneumonia- and (+) UTI as well- large leuk- negative nitrites- will start Vanc and Cefepime per pharmacy-  And ordered labs in AM 19. Wheezing  4/13- asked pt to ask for prn albuterol and added Flonase for allergies/cough  4/14- will add Albuterol nebs qam in addition to prn, since sounds wheezing every AM.  4/15- still wheezing- hadn't gotten albuterol yet this AM- will add allergy meds to see if helps  4/16- still wheezy but better.   4/17- after nebs in AM, sounds much better- encouraged pt to use prn Nebs       LOS: 12 days A FACE TO FACE EVALUATION WAS PERFORMED  Safa Derner 10/08/2019, 8:01 AM

## 2019-10-08 NOTE — Progress Notes (Signed)
Pt refuses bowel med/s two nights in a row. Pt states he had a large bowel movement today. Will continue to encourage pt to take bowel meds to prevent constipation.

## 2019-10-08 NOTE — Progress Notes (Signed)
Rapid response was notified of patient condition about 10:45 this morning. We discussed pulse, Bp, temp and O2.  Advised to notify respiratory for any treatments that could be gien at this time and to turn up the oxygen if needed. Respiratory notified and came, giving patient a breathing treatment. Rapid response nurse came to assess just after. Nasal canula changed to veni mask due to mouth breathing. Ice packs in place due to rectal temp being 103 and 650mg  of tylenol given. Labs ordered per Linna Hoff the PA. Patient is alert but lethargic at this time. Patient able to take medicine whole with no issues. Patient is resting with IV antibiotics running at this time.

## 2019-10-08 NOTE — Progress Notes (Signed)
Patient is sitting up in bed alert per his usual. Patient is currently eating his dinner and stated that he was feeling better at this time.

## 2019-10-08 NOTE — Significant Event (Addendum)
Rapid Response Event Note  Overview: Hypoxia   Initial Focused Assessment: Received a call from nursing staff with concerns of patient having SpO2 86% on 4L [place]. Per nurse, patient's HR was in the 120s as well, patient was not in distress but is requiring more oxygen. I asked that RT be called because I was with another patient.   Upon my arrival, patient was quite drowsy but once awake could answer questions. Skin was quite flushed, very hot to touch, and patient was diaphoretic. Lung sounds - RT side diminished throughout, LT side - rhonchi throughout, weak cough. RR 20-24 at times - mild use of accessory muscles at times but overall breathing comfortably. Nasal cannula in place but patient is a mouth breather.   VS: HR 120s, BP 100/51 (66),103.0 (Rectal), RR 22, 90% on VM 6L  Interventions: -- Placed on VM 6L - saturations 90% -- Ice Packs -- RN to give APAP and rest of the ordered ABX -- STAT labs and [place] of Care: -- Keep saturations 88-92% - patient has COPD and wears 2L [place] baseline at home. Wean oxygen at patient tolerates -- Recheck temperature in an hour -- Monitor VS and respiratory status -- Aspiration Precautions -- Encourage IS/FV and PT/OT  Event Summary:  Call Time 1234 Arrival Time 1255 End Time 1400   Elisia Stepp R

## 2019-10-08 NOTE — Progress Notes (Signed)
Physical Therapy Session Note  Patient Details  Name: [name] MRN: [id number] Date of Birth: 1940-10-25  Today's Date: 10/08/2019 PT Individual Time:  -    Missed PT time of 30 min.  Short Term Goals: Week 2:  PT Short Term Goal 1 (Week 2): Pt will complete least restrictive transfer with mod A consistently PT Short Term Goal 2 (Week 2): Pt will perform sit to stand wtih mod A consistently PT Short Term Goal 3 (Week 2): Pt will ambulate x 10 ft with LRAD  Skilled Therapeutic Interventions/Progress Updates: Pt lethargic, unable to participate w/ therapy.  MD in to assess pt.  Missed PT time of 30 min.     Therapy Documentation Precautions:  Precautions Precautions: Fall Restrictions Weight Bearing Restrictions: No General: PT Amount of Missed Time (min): 30 Minutes PT Missed Treatment Reason: Other (Comment)(lethargic, MD to assess) Vital Signs: Therapy Vitals Temp: 100.1 F (37.8 C) Temp Source: Oral Pulse Rate: (!) 123 Resp: 20 BP: 105/60 Patient Position (if appropriate): Lying Oxygen Therapy SpO2: (!) 87 % O2 Device: Nasal Cannula Pain: Pain Assessment Pain Score: Asleep Mobility:      Therapy/Group: Individual Therapy  Ladoris Gene 10/08/2019, 1:21 PM

## 2019-10-08 NOTE — Progress Notes (Signed)
Pt refused blood work this AM. Pt states, "I am tired of being poked all the time, if they want to poke me they can sedate me." I educated pt on how important blood work is. Informed PA, Linna Hoff of pt refusal.

## 2019-10-08 NOTE — Progress Notes (Signed)
Physical Therapy Note  Patient Details  Name: [name] MRN: [id number] Date of Birth: 11-25-1940 Today's Date: 10/08/2019    Attempted to see patient for scheduled therapy session. Patient had just returned to floor from MRI. Per RN pt presents with a fever and is being started on antibiotics. Deferred therapy session at this time due to medical concerns. Pt missed 75 min of scheduled therapy session due to being unavailable. Will follow up per POC.    Excell Seltzer, PT, DPT  10/08/2019, 11:08 AM

## 2019-10-08 NOTE — Progress Notes (Signed)
Occupational Therapy Note  Patient Details  Name: [name] MRN: [id number] Date of Birth: March 14, 1941  Today's Date: 10/08/2019 OT Missed Time: 38 Minutes Missed Time Reason: CT/MRI  Pt missed 60 mins skilled OT services.  Off unit for MRI.   Leotis Shames [facility] 10/08/2019, 9:08 AM

## 2019-10-09 ENCOUNTER — Inpatient Hospital Stay (HOSPITAL_COMMUNITY): Payer: Medicare Other | Admitting: Occupational Therapy

## 2019-10-09 ENCOUNTER — Inpatient Hospital Stay (HOSPITAL_COMMUNITY): Payer: Medicare Other

## 2019-10-09 ENCOUNTER — Ambulatory Visit (HOSPITAL_COMMUNITY): Payer: Medicare Other | Admitting: *Deleted

## 2019-10-09 ENCOUNTER — Inpatient Hospital Stay (HOSPITAL_COMMUNITY): Payer: Medicare Other | Admitting: Physical Therapy

## 2019-10-09 LAB — CBC WITH DIFFERENTIAL/PLATELET
Abs Immature Granulocytes: 0.06 10*3/uL (ref 0.00–0.07)
Basophils Absolute: 0.1 10*3/uL (ref 0.0–0.1)
Basophils Relative: 0 %
Eosinophils Absolute: 0.2 10*3/uL (ref 0.0–0.5)
Eosinophils Relative: 2 %
HCT: 36.3 % — ABNORMAL LOW (ref 39.0–52.0)
Hemoglobin: 11.8 g/dL — ABNORMAL LOW (ref 13.0–17.0)
Immature Granulocytes: 0 %
Lymphocytes Relative: 6 %
Lymphs Abs: 1 10*3/uL (ref 0.7–4.0)
MCH: 29.3 pg (ref 26.0–34.0)
MCHC: 32.5 g/dL (ref 30.0–36.0)
MCV: 90.1 fL (ref 80.0–100.0)
Monocytes Absolute: 1.4 10*3/uL — ABNORMAL HIGH (ref 0.1–1.0)
Monocytes Relative: 9 %
Neutro Abs: 12.5 10*3/uL — ABNORMAL HIGH (ref 1.7–7.7)
Neutrophils Relative %: 83 %
Platelets: 159 10*3/uL (ref 150–400)
RBC: 4.03 MIL/uL — ABNORMAL LOW (ref 4.22–5.81)
RDW: 14.6 % (ref 11.5–15.5)
WBC: 15.3 10*3/uL — ABNORMAL HIGH (ref 4.0–10.5)
nRBC: 0 % (ref 0.0–0.2)

## 2019-10-09 LAB — URINE CULTURE: Culture: >=100000 — AB

## 2019-10-09 LAB — BASIC METABOLIC PANEL
Anion gap: 11 (ref 5–15)
BUN: 15 mg/dL (ref 8–23)
CO2: 27 mmol/L (ref 22–32)
Calcium: 8.7 mg/dL — ABNORMAL LOW (ref 8.9–10.3)
Chloride: 99 mmol/L (ref 98–111)
Creatinine, Ser: 0.87 mg/dL (ref 0.61–1.24)
GFR calc Af Amer: >60 mL/min (ref >60)
GFR calc non Af Amer: >60 mL/min (ref >60)
Glucose, Bld: 99 mg/dL (ref 70–99)
Potassium: 3.8 mmol/L (ref 3.5–5.1)
Sodium: 137 mmol/L (ref 135–145)

## 2019-10-09 LAB — MRSA PCR SCREENING: MRSA by PCR: POSITIVE — AB

## 2019-10-09 MED ORDER — CHLORHEXIDINE GLUCONATE CLOTH 2 % EX PADS
6.0000 | MEDICATED_PAD | Freq: Every day | CUTANEOUS | Status: DC
  Administered 2019-10-10: 6 via TOPICAL

## 2019-10-09 MED ORDER — MUPIROCIN 2 % EX OINT
1.0000 "application " | TOPICAL_OINTMENT | Freq: Two times a day (BID) | CUTANEOUS | Status: AC
  Administered 2019-10-09 – 2019-10-14 (×10): 1 via NASAL
  Filled 2019-10-09: qty 22

## 2019-10-09 NOTE — Progress Notes (Signed)
Occupational Therapy Session Note  Patient Details  Name: [name] MRN: [id number] Date of Birth: 07-11-1940  Today's Date: 10/09/2019 OT Individual Time: 7741-4239 OT Individual Time Calculation (min): 58 min    Short Term Goals: Week 1:  OT Short Term Goal 1 (Week 1): patient will complete bed mobility - rolling and supine to/from sitting with CS OT Short Term Goal 1 - Progress (Week 1): Met OT Short Term Goal 2 (Week 1): patient will complete functional transfers with min A using LRAD OT Short Term Goal 2 - Progress (Week 1): Progressing toward goal OT Short Term Goal 3 (Week 1): patient will complete bathing in shower with min A using assistive devices, shower bench and grab bars OT Short Term Goal 3 - Progress (Week 1): Met OT Short Term Goal 4 (Week 1): patient will complete LB dressing with mod A OT Short Term Goal 4 - Progress (Week 1): Progressing toward goal OT Short Term Goal 5 (Week 1): patient will complete toileting with mod A OT Short Term Goal 5 - Progress (Week 1): Progressing toward goal Week 2:  OT Short Term Goal 1 (Week 2): STG=LTG secondary to ELOS  Skilled Therapeutic Interventions/Progress Updates:  Patient met lying supine in bed in agreement with OT treatment session. Patient A&Ox4, yet very lethargic. Patient reports 0/10 pain at rest but c/o pain at with touch to area around lateral malleolus of LLE. Visual assessment of area with noted bandage covering. Eyes remaining closed for initial conversation at start of treatment session. Vitals monitored with SpO2 of 83-86% at rest on room air, BP 102/35mHg, and HR of 92bpm. RN notified. Patient placed on 3L O2 via [place] per RN request. SpO2 93-94% on 3L. Patient in agreement with sitting EOB to complete portion of breakfast meal. Max A with use of bed functions and bed rails, increased time/effort for supine to EOB transfer and anterior scoot toward EOB. Patient initially requiring cueing and Max A to maintain static  sitting balance at EOB, progressing to close supervision A by end of treatment session. Patient able to tolerate unsupported sitting EOB for ~15-20 minutes to eat bowl of cereal with LUE supported on EOB and BLE supported on ground. Return to supine with Max A secondary to fatigue, BLE weakness and LLE spasms and [facility]. Session concluded with patient lying supine in bed with call bell within reach, bed alarm activated, and all needs met.   Therapy Documentation Precautions:  Precautions Precautions: Fall Restrictions Weight Bearing Restrictions: No  Vital Signs: Supine HR:92bpm SpO2: 93-94% BP: 102/616mg  EOB HR: 94bpm SpO2: 91-94% on 3L BP: 110/7222m  Therapy/Group: Individual Therapy  Evita Merida R [place]-Davis 10/09/2019, 7:54 AM

## 2019-10-09 NOTE — Progress Notes (Signed)
Occupational Therapy Session Note  Patient Details  Name: [name] MRN: [id number] Date of Birth: 04/02/1941  Today's Date: 10/09/2019 OT Individual Time: 1100-1200 OT Individual Time Calculation (min): 60 min    Short Term Goals: Week 2:  OT Short Term Goal 1 (Week 2): STG=LTG secondary to ELOS  Skilled Therapeutic Interventions/Progress Updates:    Cotreatment with RT. Pt resting in bed upon arrival. OT intervention with focus on bed mobility and sitting balance to increase independence in BADLs and all functional tasks. Rolling in bed using bed rails with max A +2 to cleanse buttocks and change brief.  Pt with redness on scrotum.  RN notified and barrier cream applied. Pt rolled R<>L X6 during session for hygiene and changing brief.  Supine>sit EOB with +2 max A. Pt c/o slight "fuzziness" upon sitting EOB but resolved after approx 60 seconds. Pt returned to supine with +2 total A.  Repositioned in bed with Tot A+2. Pt remained in bed with all needs within reach and bed alarm activated.   Therapy Documentation Precautions:  Precautions Precautions: Fall Restrictions Weight Bearing Restrictions: No Pain:  Pt c/o ongoing L ankle pain and tenderness; repositioned and RN aware   Therapy/Group: Individual Therapy  [name] 10/09/2019, 12:05 PM

## 2019-10-09 NOTE — Progress Notes (Signed)
[place] PHYSICAL MEDICINE & REHABILITATION PROGRESS NOTE    Subjective:  Feeling better this AM than yesterday.   Now has condom catheter- using purewick canister.   C/o not getting the water he needs overnight.    ROS:  Pt denies SOB, abd pain, CP, N/V/C/D, and vision changes   Objective:   DG CHEST PORT 1 VIEW  Result Date: 10/07/2019 CLINICAL DATA:  Leukocytosis. EXAM: PORTABLE CHEST 1 VIEW COMPARISON:  September 17, 2019. FINDINGS: Stable cardiomegaly. No pneumothorax is noted. Right lung is clear. Large left lower lobe airspace opacity is noted concerning for pneumonia or atelectasis with associated pleural effusion. Bony thorax is unremarkable. IMPRESSION: Large left lower lobe airspace opacity is noted concerning for pneumonia or atelectasis with associated pleural effusion. Electronically Signed   By: Marijo Conception M.D.   On: 10/07/2019 12:51   Recent Labs    10/08/19 1455 10/09/19 0721  WBC 18.6* 15.3*  HGB 12.2* 11.8*  HCT 37.7* 36.3*  PLT 171 159   Recent Labs    10/08/19 1455 10/09/19 0721  NA 138 137  K 3.9 3.8  CL 99 99  CO2 27 27  GLUCOSE 139* 99  BUN 16 15  CREATININE 0.91 0.87  CALCIUM 8.3* 8.7*    Intake/Output Summary (Last 24 hours) at 10/09/2019 0858 Last data filed at 10/08/2019 1850 Gross per 24 hour  Intake 620 ml  Output --  Net 620 ml     Physical Exam: Vital Signs Blood pressure 133/81, pulse 89, temperature 97.8 F (36.6 C), temperature source Axillary, resp. rate 18, height 5\' 7"  (1.702 m), weight 97 kg, SpO2 95 %. Constitutional:awake, alert, appropriate, sitting up in bed, NAD HENT:  Conjugate gaze  Cardiovascular: RRR Respiratory:after nebs- very juicy, coarse and wheezing even AFTER nebs- decreased in bases B/L [id number], NT, ND, (+)BS  Musculoskeletal:  Cervical back: Normal range of motion.  Neurological:Ox3 .CN exam intact. UE grossly 5/5 with trace to 1+ DTR's. RLE 4/5 prox to distal with pain inhibition.  LLE: 2+ to 3+ prox to distal with more pain inhibition. Sensation to LT/temperature decreased in LLE up to nearly umbilicus. DTR's in LE surprisingly brisk at 2+ patella/achilles.  LLE jumping throughout interview- spasms- no change  Skin: laceration right index finger, abrasions healing well on left knee,  L lateral malleolus dime sized- pink, a little red around wound- ~ 2-65mm deep; little slough- no change on exam PRAFOs in place, but since frog legged, lateral maleolus on bed . Buttocks slightly red, moist, no breakdown.  Psychiatric:frustrated about spasticity- refused labs this AM  Assessment/Plan: 1. Functional deficits secondary to lumbar spinal stenosis with neurogenic claudication which require 3+ hours per day of interdisciplinary therapy in a comprehensive inpatient rehab setting.  Physiatrist is providing close team supervision and 24 hour management of active medical problems listed below.  Physiatrist and rehab team continue to assess barriers to discharge/monitor patient progress toward functional and medical goals  Care Tool:  Bathing    Body parts bathed by patient: Right arm, Left arm, Chest, Abdomen, Front perineal area, Right upper leg, Left upper leg, Right lower leg, Left lower leg, Face   Body parts bathed by helper: Buttocks     Bathing assist Assist Level: Minimal Assistance - Patient > 75%     Upper Body Dressing/Undressing Upper body dressing   What is the patient wearing?: Pull over shirt    Upper body assist Assist Level: Supervision/Verbal cueing    Lower Body Dressing/Undressing Lower  body dressing      What is the patient wearing?: Pants     Lower body assist Assist for lower body dressing: Total Assistance - Patient < 25%     Toileting Toileting    Toileting assist Assist for toileting: Dependent - Patient 0%     Transfers Chair/bed transfer  Transfers assist     Chair/bed transfer assist level: Dependent - mechanical  lift(stedy)     Locomotion Ambulation   Ambulation assist   Ambulation activity did not occur: Safety/medical concerns  Assist level: 2 helpers Assistive device: Parallel bars Max distance: 5'   Walk 10 feet activity   Assist  Walk 10 feet activity did not occur: Safety/medical concerns        Walk 50 feet activity   Assist Walk 50 feet with 2 turns activity did not occur: Safety/medical concerns         Walk 150 feet activity   Assist Walk 150 feet activity did not occur: Safety/medical concerns         Walk 10 feet on uneven surface  activity   Assist Walk 10 feet on uneven surfaces activity did not occur: Safety/medical concerns         Wheelchair     Assist Will patient use wheelchair at discharge?: Yes Type of Wheelchair: Manual    Wheelchair assist level: Supervision/Verbal cueing Max wheelchair distance: 150'    Wheelchair 50 feet with 2 turns activity    Assist        Assist Level: Supervision/Verbal cueing   Wheelchair 150 feet activity     Assist      Assist Level: Supervision/Verbal cueing   Blood pressure 133/81, pulse 89, temperature 97.8 F (36.6 C), temperature source Axillary, resp. rate 18, height 5\' 7"  (1.702 m), weight 97 kg, SpO2 95 %.    Medical Problem List and Plan: 1.Decreased functional ability due to lumbar stenosis/radiculopathy with neurogenic claudication. MRI's also suggest (albeit with motion artifact) distal thoracic involvement as well.  Exam is more c/w this too.              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             -consider re-imaging of thoracic spine (perhaps with sedation) to better look at thoracic cord.  4/13- will give pt PRAFOs B/L to prevent heel/ankle skin breakdown  at night  4/15- ordered new PRAFOS with kickstands- since original ones didn't have them- they are usually standard.   4/16- PRAFOs with kickstands came- not helping because pt sits with hips externally rotated  4/18- sitting frog legged= can't fix issue with this position             -Continue CIR PT, OT -patient may shower -ELOS/Goals: 14-18 days, supervision to min assist with PT,OT 2. Antithrombotics: -DVT/anticoagulation:Lovenox. Check vascular study  4/12- Dopplers (-)- con't lovenox -antiplatelet therapy: Aspirin 81 mg daily 3. Pain Management:Baclofen 15 mg 3 times daily, Neurontin 100 mg nightly, OxyContin 10 mg every 12 hours, hydrocodone as needed as well as Imitrex for headaches -adjust regimen as needed based on efficacy, wean as possible             -oral steroids to taper  -pain present in therapy yesterday  and at rest this morning at left ankle wound site, wound assessed and healing well. Has not been taking hydrocodone; one dose administered this morning.   4/13- will add Zanaflex 2 mg TID for spasticity  4/14- spasticity improved per pt  4/15- spasticity back to being a problem- will order Valium 5 mg BID prn for pt.    4/18- discussed dantrolene- will try 50 mg QHS- if makes too sleepy, can stop it.   4/19: Patient was very sleepy this morning. Will stop Dantrolene.  4/20- discussed ITB pump and possible trial- will call and see if at all possible to do while here. On doses of meds can tolerate, due to sedation, but cannot increase. Last took Valium yesterday AM.  4/21- discussed keppra- explained is not FDA approved for spasticity- will wait on him to get MRI- is ordered   4/22- waiting on MRI once pt feeling better from pneumonia.  4. Mood:Provide emotional support -antipsychotic agents: N/A 5. Neuropsych: This patientiscapable of making decisions on hisown behalf. 6. Skin/Wound Care:place steristrips  over right index finger lac.                          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    4/15- ordered new PRAFOs to keep feet from turning out/rotating out  4/16- PRAFOs came with kickstands                         -foam dressing left knee 7. Fluids/Electrolytes/Nutrition:Routine in and outs with follow-up chemistries. Diet changed to regular as per patient's preference. He states he would not follow current diet at home as is unappealing to him. I discussed that our goal is to simulate home environment as medications are adjusted according to current labs/vitals. I discussed that goal of heart healthy diet is to decrease sodium and saturated fat to protect the heart and circulatory system. Discussed his current diet and advised that he cut back on sugary juices, which he likes. Recommended replacing with water. He asked about his [employer] which he and his wife drink a lot of at home. Advised that despite it having no calories, it has a lot of processed ingredients and his goal should be to minimize processed foods. He expressed understanding. Continue nutritional counseling.   4/19: Electrolytes stable on BMP today. Calcium low at 8.7. Will start calcium supplement.  8. Chronic restless leg syndrome. Sinemet 10-100 mg twice daily. 9. 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 OSA with CPAP/2 L chronic oxygen at home. Continue inhalers 11. 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 Follow-up outpatient 12. Diabetes mellitus. Hemoglobin A1c 5.8. SSI. Changed to [facility] HS for CBG and SSI  4/17- pt refused to get BGs checked- stopped 4/12- A1c 5.8- 13. Neurogenic bowel and bladder. Check PVR  4/12- no  BM since Friday- if no BM overnight, will give Mg citrate and enema tomorrow.   4/13- had extra large BM last night- con't regimen  4/19- patient denies constipation.  4/21- last BM last night- refused bowel meds 14. Hypothyroidism. Synthroid 15. Chronic diastolic congestive heart failure. Monitor for any signs of fluid overload. Check regular weights  Filed Weights   10/06/19 0509 10/07/19 0546 10/09/19 0340  Weight: 98.6 kg 97.6 kg 97 kg   4/13- will monitor for trends  4/14- up 0.9 kg in 1 day- if goes up again, will check BNP  4/15- Weight up another 1 kg- will check BNP and labs tomorrow  4/17- Weight back down slightly- will monitor- BNP only 45  4/18- weight down 1/2 kg- will con't regimen  4/21- weight stable- con't meds  4/22- Weight down to 97 kg- con't regimen 16. Hyperlipidemia. Zocor 17. Code status: Changed to DNR as per patient's wishes. 18. Leukocytosis - pneumonia-   4/12- on prednisone taper- the cause since afebrile and no Sx's of illness-con't steroid taper  4/20- WBC back up to 14.7- off prednisone-Tm 99.9 yesterday- will check U/A, U Cx,  and CXR  4/21-  U/A somewhat (+) and CXR also shows likely pneumonia- and (+) UTI as well- large leuk- negative nitrites- will start Vanc and Cefepime per pharmacy-  And ordered labs in AM  4/22- WBC went up to 18.6k- back down to 15.3 k- fever broke yesterday- feeling a little better- will check labs in AM for trend and then not over weekend, unless needed 19. Wheezing  4/13- asked pt to ask for prn albuterol and added Flonase for allergies/cough  4/14- will add Albuterol nebs qam in addition to prn, since sounds wheezing every AM.  4/15- still wheezing- hadn't gotten albuterol yet this AM- will add allergy meds to see if helps  4/16- still wheezy but better.   4/17- after nebs in AM, sounds much better- encouraged pt to use prn Nebs       LOS: 13 days A FACE TO FACE EVALUATION WAS PERFORMED  Videl Nobrega 10/09/2019, 8:58 AM   [place] PHYSICAL MEDICINE & REHABILITATION PROGRESS NOTE    Subjective:  Pt refused blood work this AM- to see what his WBC was- Large left airspace opacity on Lower lobe- concerning for pneumonia. Martin Majestic back and spoke to pt- wants ot start ABX now- feeling "worse" than normal and very cold.   ROS:  Pt denies SOB, abd pain, CP, N/V/C/D, and vision changes   Objective:   DG CHEST PORT 1 VIEW  Result Date: 10/07/2019 CLINICAL DATA:  Leukocytosis. EXAM: PORTABLE CHEST 1 VIEW COMPARISON:  September 17, 2019. FINDINGS: Stable cardiomegaly. No pneumothorax is noted. Right lung is clear. Large left lower lobe airspace opacity is noted concerning for pneumonia or atelectasis with associated pleural effusion. Bony thorax is unremarkable. IMPRESSION: Large left lower lobe airspace opacity is noted concerning for pneumonia or atelectasis with associated pleural effusion. Electronically Signed   By: Marijo Conception M.D.   On: 10/07/2019 12:51   Recent Labs    10/08/19 1455 10/09/19 0721  WBC 18.6* 15.3*  HGB 12.2* 11.8*  HCT 37.7* 36.3*  PLT 171 159   Recent Labs    10/08/19 1455 10/09/19 0721  NA 138 137  K 3.9 3.8  CL 99 99  CO2 27 27  GLUCOSE 139* 99  BUN 16 15  CREATININE 0.91 0.87  CALCIUM 8.3* 8.7*    Intake/Output Summary (Last 24 hours) at 10/09/2019 0858 Last data filed at 10/08/2019 1850 Gross per 24 hour  Intake 620 ml  Output --  Net 620 ml     Physical Exam: Vital Signs Blood pressure 133/81, pulse 89, temperature 97.8 F (36.6 C), temperature source Axillary, resp. rate 18, height 5\' 7"  (1.702 m),  weight 97 kg, SpO2 95 %. Constitutional:sitting up in bed; slightly less coarse cough, NAD; c/o freezing HENT:  Conjugate gaze  Cardiovascular: RRR Respiratory:before nebs- juicy audibly; wheezing throughout- slightly decreased at bases B/L-  [id number], NT, ND, (+)BS  Musculoskeletal:  Cervical back: Normal range of  motion.  Neurological:Ox3 .CN exam intact. UE grossly 5/5 with trace to 1+ DTR's. RLE 4/5 prox to distal with pain inhibition. LLE: 2+ to 3+ prox to distal with more pain inhibition. Sensation to LT/temperature decreased in LLE up to nearly umbilicus. DTR's in LE surprisingly brisk at 2+ patella/achilles.  LLE jumping throughout interview- spasms  Skin: laceration right index finger, abrasions healing well on left knee,  L lateral malleolus dime sized- pink, a little red around wound- ~ 2-75mm deep; little slough- no change on exam PRAFOs in place, but since frog legged, lateral maleolus on bed . Buttocks slightly red, moist, no breakdown.  Psychiatric:frustrated about spasticity- refused labs this AM  Assessment/Plan: 1. Functional deficits secondary to lumbar spinal stenosis with neurogenic claudication which require 3+ hours per day of interdisciplinary therapy in a comprehensive inpatient rehab setting.  Physiatrist is providing close team supervision and 24 hour management of active medical problems listed below.  Physiatrist and rehab team continue to assess barriers to discharge/monitor patient progress toward functional and medical goals  Care Tool:  Bathing    Body parts bathed by patient: Right arm, Left arm, Chest, Abdomen, Front perineal area, Right upper leg, Left upper leg, Right lower leg, Left lower leg, Face   Body parts bathed by helper: Buttocks     Bathing assist Assist Level: Minimal Assistance - Patient > 75%     Upper Body Dressing/Undressing Upper body dressing   What is the patient wearing?: Pull over shirt    Upper body assist Assist Level: Supervision/Verbal cueing    Lower Body Dressing/Undressing Lower body dressing      What is the patient wearing?: Pants     Lower body assist Assist for lower body dressing: Total Assistance - Patient < 25%     Toileting Toileting    Toileting assist Assist for toileting: Dependent - Patient 0%      Transfers Chair/bed transfer  Transfers assist     Chair/bed transfer assist level: Dependent - mechanical lift(stedy)     Locomotion Ambulation   Ambulation assist   Ambulation activity did not occur: Safety/medical concerns  Assist level: 2 helpers Assistive device: Parallel bars Max distance: 5'   Walk 10 feet activity   Assist  Walk 10 feet activity did not occur: Safety/medical concerns        Walk 50 feet activity   Assist Walk 50 feet with 2 turns activity did not occur: Safety/medical concerns         Walk 150 feet activity   Assist Walk 150 feet activity did not occur: Safety/medical concerns         Walk 10 feet on uneven surface  activity   Assist Walk 10 feet on uneven surfaces activity did not occur: Safety/medical concerns         Wheelchair     Assist Will patient use wheelchair at discharge?: Yes Type of Wheelchair: Manual    Wheelchair assist level: Supervision/Verbal cueing Max wheelchair distance: 150'    Wheelchair 50 feet with 2 turns activity    Assist        Assist Level: Supervision/Verbal cueing   Wheelchair 150 feet activity     Assist  Assist Level: Supervision/Verbal cueing   Blood pressure 133/81, pulse 89, temperature 97.8 F (36.6 C), temperature source Axillary, resp. rate 18, height 5\' 7"  (1.702 m), weight 97 kg, SpO2 95 %.    Medical Problem List and Plan: 1.Decreased functional ability due to lumbar stenosis/radiculopathy with neurogenic claudication. MRI's also suggest (albeit with motion artifact) distal thoracic involvement as well.  Exam is more c/w this too.              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             -consider re-imaging of thoracic spine (perhaps  with sedation) to better look at thoracic cord.  4/13- will give pt PRAFOs B/L to prevent heel/ankle skin breakdown at night  4/15- ordered new PRAFOS with kickstands- since original ones didn't have them- they are usually standard.   4/16- PRAFOs with kickstands came- not helping because pt sits with hips externally rotated  4/18- sitting frog legged= can't fix issue with this position             -Continue CIR PT, OT -patient may shower -ELOS/Goals: 14-18 days, supervision to min assist with PT,OT 2. Antithrombotics: -DVT/anticoagulation:Lovenox. Check vascular study  4/12- Dopplers (-)- con't lovenox -antiplatelet therapy: Aspirin 81 mg daily 3. Pain Management:Baclofen 15 mg 3 times daily, Neurontin 100 mg nightly, OxyContin 10 mg every 12 hours, hydrocodone as needed as well as Imitrex for headaches -adjust regimen as needed based on efficacy, wean as possible             -oral steroids to taper  -pain present in therapy yesterday and at rest this morning at left ankle wound site, wound assessed and healing well. Has not been taking hydrocodone; one dose administered this morning.   4/13- will add Zanaflex 2 mg TID for spasticity  4/14- spasticity improved per pt  4/15- spasticity back to being a problem- will order Valium 5 mg BID prn for pt.    4/18- discussed dantrolene- will try 50 mg QHS- if makes too sleepy, can stop it.   4/19: Patient was very sleepy this morning. Will stop Dantrolene.  4/20- discussed ITB pump and possible trial- will call and see if at all possible to do while here. On doses of meds can tolerate, due to sedation, but cannot increase. Last took Valium yesterday AM.  4/21- discussed keppra- explained is not FDA approved for spasticity- will wait on him to get MRI- is ordered  4. Mood:Provide emotional support -antipsychotic agents: N/A 5. Neuropsych: This patientiscapable of making decisions on  hisown behalf. 6. Skin/Wound Care:place steristrips over right index finger lac.                          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    4/15- ordered new PRAFOs to keep feet from turning out/rotating out  4/16- PRAFOs came with kickstands                         -foam dressing left knee  7. Fluids/Electrolytes/Nutrition:Routine in and outs with follow-up chemistries. Diet changed to regular as per patient's preference. He states he would not follow current diet at home as is unappealing to him. I discussed that our goal is to simulate home environment as medications are adjusted according to current labs/vitals. I discussed that goal of heart healthy diet is to decrease sodium and saturated fat to protect the heart and circulatory system. Discussed his current diet and advised that he cut back on sugary juices, which he likes. Recommended replacing with water. He asked about his [employer] which he and his wife drink a lot of at home. Advised that despite it having no calories, it has a lot of processed ingredients and his goal should be to minimize processed foods. He expressed understanding. Continue nutritional counseling.   4/19: Electrolytes stable on BMP today. Calcium low at 8.7. Will start calcium supplement.  8. Chronic restless leg syndrome. Sinemet 10-100 mg twice daily. 9. 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 OSA with CPAP/2 L chronic oxygen at home. Continue inhalers 11. 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 Follow-up outpatient 12. Diabetes mellitus. Hemoglobin A1c 5.8. SSI. Changed to [facility] HS for CBG and SSI  4/17- pt refused to get BGs checked- stopped 4/12- A1c 5.8- 13.  Neurogenic bowel and bladder. Check PVR  4/12- no BM since Friday- if no BM overnight, will give Mg citrate and enema tomorrow.   4/13- had extra large BM last night- con't regimen  4/19- patient denies constipation.  4/21- last BM last night- refused bowel meds 14. Hypothyroidism. Synthroid 15. Chronic diastolic congestive heart failure. Monitor for any signs of fluid overload. Check regular weights   Filed Weights   10/06/19 0509 10/07/19 0546 10/09/19 0340  Weight: 98.6 kg 97.6 kg 97 kg   4/13- will monitor for trends  4/14- up 0.9 kg in 1 day- if goes up again, will check BNP  4/15- Weight up another 1 kg- will check BNP and labs tomorrow  4/17- Weight back down slightly- will monitor- BNP only 45  4/18- weight down 1/2 kg- will con't regimen  4/21- weight stable- con't meds 16. Hyperlipidemia. Zocor 17. Code status: Changed to DNR as per patient's wishes. 18. Leukocytosis   4/12- on prednisone taper- the cause since afebrile and no Sx's of illness-con't steroid taper  4/20- WBC back up to 14.7- off prednisone-Tm 99.9 yesterday- will check U/A, U Cx,  and CXR  4/21-  U/A somewhat (+) and CXR also shows likely pneumonia- and (+) UTI as well- large leuk- negative nitrites- will start Vanc and Cefepime per pharmacy-  And ordered labs in AM 19. Wheezing  4/13- asked pt to ask for prn albuterol and added Flonase for allergies/cough  4/14- will add Albuterol nebs qam in addition to prn, since sounds wheezing every AM.  4/15- still wheezing- hadn't gotten albuterol yet this AM- will add allergy meds to see if helps  4/16- still wheezy but better.   4/17- after nebs in AM, sounds much better- encouraged pt to use prn Nebs       LOS: 13 days A FACE TO FACE EVALUATION WAS PERFORMED  Arliss Hepburn 10/09/2019, 8:58 AM

## 2019-10-09 NOTE — Progress Notes (Signed)
Physical Therapy Session Note  Patient Details  Name: [name] MRN: [id number] Date of Birth: September 14, 1940  Today's Date: 10/09/2019 PT Individual Time: 1420-1505 PT Individual Time Calculation (min): 45 min   Short Term Goals: Week 2:  PT Short Term Goal 1 (Week 2): Pt will complete least restrictive transfer with mod A consistently PT Short Term Goal 2 (Week 2): Pt will perform sit to stand wtih mod A consistently PT Short Term Goal 3 (Week 2): Pt will ambulate x 10 ft with LRAD  Skilled Therapeutic Interventions/Progress Updates:   Pt received in supine and agreeable to therapy, no c/o pain. Pt overall well-appearing and alert today. Fastened brief w/ total assist and performed supine>sit w/ max assist +2, mod assist to maintain static sitting balance w/ verbal and tactile cues for BUE positioning. Sit>stand in bariatric stedy w/ max assist +2, verbal cues to push up on BLEs and tactile/manual cues to boost and bring hips forward. Stedy transfer to w/c. Pt remained on 2 L/min O2 throughout session, spO2 >95% after transfer and HR in 90s bpm. No signs of symptoms of orthostasis or intolerance to upright. Max assist +2 to boost back in w/c. Performed BLE strengthening exercises while up in w/c including LAQs 2x5, knee marches 2x5, and ankle pumps 2x5. No physical assist needed but pt performed exercises very slowly. Pt w/ increased fatigue, difficulty keeping eyes open. He did confirm that he wanted to stay up in the w/c, even if he was sleepy and fatigued. Alarm belt engaged and pt ended session in w/c, all needs in reach. Made RN and NT aware of status. Missed 30 min of skilled PT 2/2 fatigue.   Therapy Documentation Precautions:  Precautions Precautions: Fall Restrictions Weight Bearing Restrictions: No Vital Signs: Therapy Vitals Temp: 99 F (37.2 C) Temp Source: Oral Pulse Rate: 98 Resp: (!) 24 BP: 120/68 Patient Position (if appropriate): Lying Oxygen Therapy SpO2: 97 %    Therapy/Group: Individual Therapy  Normajean Nash Clent Demark 10/09/2019, 3:20 PM

## 2019-10-09 NOTE — Progress Notes (Signed)
Anesthesiology called to notify that they will not put patient under general anesthesia until the patient pneumonia is completely resolved. If MD wishes to, they can call the anesthesiologist to discuss the matter further.

## 2019-10-10 ENCOUNTER — Inpatient Hospital Stay (HOSPITAL_COMMUNITY): Payer: Medicare Other

## 2019-10-10 ENCOUNTER — Inpatient Hospital Stay (HOSPITAL_COMMUNITY): Payer: Medicare Other | Admitting: Physical Therapy

## 2019-10-10 LAB — BASIC METABOLIC PANEL
Anion gap: 11 (ref 5–15)
BUN: 15 mg/dL (ref 8–23)
CO2: 28 mmol/L (ref 22–32)
Calcium: 8.6 mg/dL — ABNORMAL LOW (ref 8.9–10.3)
Chloride: 101 mmol/L (ref 98–111)
Creatinine, Ser: 0.81 mg/dL (ref 0.61–1.24)
GFR calc Af Amer: >60 mL/min (ref >60)
GFR calc non Af Amer: >60 mL/min (ref >60)
Glucose, Bld: 97 mg/dL (ref 70–99)
Potassium: 3.6 mmol/L (ref 3.5–5.1)
Sodium: 140 mmol/L (ref 135–145)

## 2019-10-10 LAB — CBC WITH DIFFERENTIAL/PLATELET
Abs Immature Granulocytes: 0.05 10*3/uL (ref 0.00–0.07)
Basophils Absolute: 0.1 10*3/uL (ref 0.0–0.1)
Basophils Relative: 1 %
Eosinophils Absolute: 0.4 10*3/uL (ref 0.0–0.5)
Eosinophils Relative: 4 %
HCT: 34.2 % — ABNORMAL LOW (ref 39.0–52.0)
Hemoglobin: 11.2 g/dL — ABNORMAL LOW (ref 13.0–17.0)
Immature Granulocytes: 1 %
Lymphocytes Relative: 9 %
Lymphs Abs: 1 10*3/uL (ref 0.7–4.0)
MCH: 29.8 pg (ref 26.0–34.0)
MCHC: 32.7 g/dL (ref 30.0–36.0)
MCV: 91 fL (ref 80.0–100.0)
Monocytes Absolute: 1 10*3/uL (ref 0.1–1.0)
Monocytes Relative: 10 %
Neutro Abs: 7.8 10*3/uL — ABNORMAL HIGH (ref 1.7–7.7)
Neutrophils Relative %: 75 %
Platelets: 153 10*3/uL (ref 150–400)
RBC: 3.76 MIL/uL — ABNORMAL LOW (ref 4.22–5.81)
RDW: 14.6 % (ref 11.5–15.5)
WBC: 10.3 10*3/uL (ref 4.0–10.5)
nRBC: 0 % (ref 0.0–0.2)

## 2019-10-10 LAB — VANCOMYCIN, TROUGH: Vancomycin Tr: 11 ug/mL — ABNORMAL LOW (ref 15–20)

## 2019-10-10 LAB — VANCOMYCIN, PEAK: Vancomycin Pk: 23 ug/mL — ABNORMAL LOW (ref 30–40)

## 2019-10-10 MED ORDER — OXYMETAZOLINE HCL 0.05 % NA SOLN
1.0000 | Freq: Two times a day (BID) | NASAL | Status: DC | PRN
  Administered 2019-10-11 – 2019-10-19 (×2): 1 via NASAL
  Filled 2019-10-10: qty 30

## 2019-10-10 NOTE — Plan of Care (Signed)
Downgraded transfer goals to mod A due to slow progress. Discharged gait goals due to slow progress.  Problem: Sit to Stand Goal: LTG:  Patient will perform sit to stand with assistance level (PT) Description: LTG:  Patient will perform sit to stand with assistance level (PT) Flowsheets (Taken 10/10/2019 1546) LTG: PT will perform sit to stand in preparation for functional mobility with assistance level: (downgrade due to slow progress) Moderate Assistance - Patient 50 - 74% Note: Downgrade due to slow progress   Problem: RH Bed to Chair Transfers Goal: LTG Patient will perform bed/chair transfers w/assist (PT) Description: LTG: Patient will perform bed to chair transfers with assistance (PT). Flowsheets (Taken 10/10/2019 1546) LTG: Pt will perform Bed to Chair Transfers with assistance level: (downgrade due to slow progress) Moderate Assistance - Patient 50 - 74% Note: Downgrade due to slow progress   Problem: RH Car Transfers Goal: LTG Patient will perform car transfers with assist (PT) Description: LTG: Patient will perform car transfers with assistance (PT). Flowsheets (Taken 10/10/2019 1546) LTG: Pt will perform car transfers with assist:: (downgrade due to slow progress) Moderate Assistance - Patient 50 - 74% Note: Downgrade due to slow progress   Problem: RH Ambulation Goal: LTG Patient will ambulate in controlled environment (PT) Description: LTG: Patient will ambulate in a controlled environment, # of feet with assistance (PT). Flowsheets (Taken 10/10/2019 1546) LTG: Pt will ambulate in controlled environ  assist needed:: (d/c goal due to slow progress) -- Note: D/c goal due to slow progress Goal: LTG Patient will ambulate in home environment (PT) Description: LTG: Patient will ambulate in home environment, # of feet with assistance (PT). Flowsheets (Taken 10/10/2019 1546) LTG: Pt will ambulate in home environ  assist needed:: (d/c goal due to slow progress) -- Note: D/c goal due  to slow progress

## 2019-10-10 NOTE — Progress Notes (Signed)
Occupational Therapy Session Note  Patient Details  Name: [name] MRN: [id number] Date of Birth: 1940/08/26  Today's Date: 10/10/2019 OT Individual Time: 0900-0940 OT Individual Time Calculation (min): 40 min  and Today's Date: 10/10/2019 OT Missed Time: 20 Minutes Missed Time Reason: Patient fatigue   Short Term Goals: Week 2:  OT Short Term Goal 1 (Week 2): STG=LTG secondary to ELOS  Skilled Therapeutic Interventions/Progress Updates:    Pt asleep in bed with breakfast tray on bedside table.  Pt had fallen asleep before finishing cereal.  Pt stated he has been awake for one reason or another since midnight. Focus on self feeding and bed mobility.  Pt alert enough to safely eat his cereal with extra time.  Wash cloth provided to wash face.  O2 dropped to 80% with O2 removed while washing face.  O2 rebounded to 91% within 30 seconds on 3L O2. Pt requires max A for all bed mobility.  Pt stated he wanted to get into w/c but needed to sleep first. Pt missed 20 mins skilled OT services.   Therapy Documentation Precautions:  Precautions Precautions: Fall Restrictions Weight Bearing Restrictions: No General: General OT Amount of Missed Time: 20 Minutes Pain: Pain Assessment Pain Score: 0-No pain   Therapy/Group: Individual Therapy  [name] 10/10/2019, 9:52 AM

## 2019-10-10 NOTE — Progress Notes (Signed)
Physical Therapy Weekly Progress Note  Patient Details  Name: [name] MRN: [id number] Date of Birth: [birth year]  Beginning of progress report period: October 03, 2019 End of progress report period: October 10, 2019  Today's Date: 10/10/2019 PT Individual Time: 1415-1500 PT Individual Time Calculation (min): 45 min  PT Missed Time: 30 min Missed Time Reason: patient fatigue  Patient has met 0 of 3 short term goals.  Pt has not met STG this week due to decline in overall function due to medical setbacks. Pt diagnosed with pneumonia within the past few days which has limited his endurance, tolerance for therapy, and increased his assist level. Pt currently requires max A to +2 for bed mobility, +2 for sit to stand to stedy, and is able to propel the w/c x 50 ft at Supervision level before onset of fatigue.  Patient continues to demonstrate the following deficits muscle weakness, decreased cardiorespiratoy endurance and decreased sitting balance, decreased standing balance, decreased postural control and decreased balance strategies and therefore will continue to benefit from skilled PT intervention to increase functional independence with mobility.  Patient not progressing toward long term goals.  See goal revision..  Plan of care revisions: Downgraded transfer goals to mod A overall. Discharged gait goals due to slow progress..  PT Short Term Goals Week 2:  PT Short Term Goal 1 (Week 2): Pt will complete least restrictive transfer with mod A consistently PT Short Term Goal 1 - Progress (Week 2): Not met PT Short Term Goal 2 (Week 2): Pt will perform sit to stand wtih mod A consistently PT Short Term Goal 2 - Progress (Week 2): Not met PT Short Term Goal 3 (Week 2): Pt will ambulate x 10 ft with LRAD PT Short Term Goal 3 - Progress (Week 2): Not met Week 3:  PT Short Term Goal 1 (Week 3): =LTG due to ELOS  Skilled Therapeutic Interventions/Progress Updates:    Pt received seated in w/c in  room, agreeable to PT session. No complaints of pain. Pt on 3L O2 at rest, SpO2 88% and returns to 90% (+) with cues for pursed lip breathing techniques. Manual w/c propulsion x 50 ft with use of BUE at Supervision level before onset of fatigue. SpO2 93% following w/c mobility. Seated UBE x 4 min forwards and 4 min backwards with seated rest break in between at level 2.3 for global endurance training. SpO2 remains at 95% and above during exercise. Pt declines to participate any further in therapy session following UBE due to fatigue. Pt requests to remain seated in w/c in room. Assisted pt with transport via w/c back to room dependently. Pt left seated in w/c in room with needs in reach, quick release belt and chair alarm in place at end of session. Pt missed 30 min of scheduled therapy session due to fatigue.  Therapy Documentation Precautions:  Precautions Precautions: Fall Restrictions Weight Bearing Restrictions: No General: PT Amount of Missed Time (min): 30 Minutes PT Missed Treatment Reason: Patient fatigue   Therapy/Group: Individual Therapy   Excell Seltzer, PT, DPT 10/10/2019, 3:35 PM

## 2019-10-10 NOTE — Progress Notes (Addendum)
RT spoke with patient about CPAP use on 4/22 and for use on 4/23. Patient stated he did use the CPAP and at some point on 4/22 he thought the CPAP stopped working. He had to go to the bathroom and when he woke up it was not on. RT tested machine on 4/23 and the machine is working properly. RT let machine run for two hours prior to placing on pt and it had no malfunctions and it it was working fine. Pt will be placed on CPAP at 2200 tonight and RT will check back on him at 0200- 4/24 to make sure machine is still working properly. J Serine Kea RT

## 2019-10-10 NOTE — Progress Notes (Signed)
Occupational Therapy Session Note  Patient Details  Name: PAXSON STEFANOVIC MRN: [id number] Date of Birth: [birth year]  Today's Date: 10/10/2019 OT Individual Time: 1107-1200 OT Individual Time Calculation (min): 53 min  and Today's Date: 10/10/2019 OT Missed Time: 7 Minutes Missed Time Reason: Nursing care;Other (comment)(donning primofit catheter)   Short Term Goals: Week 2:  OT Short Term Goal 1 (Week 2): STG=LTG secondary to ELOS  Skilled Therapeutic Interventions/Progress Updates:  Pt received supine in bed on 3L O2 with O2 92% at rest with pt agreeable to OT session.Pt required total A to don socks and TEDs from supine. Pt noted to doff O2 during LB dressing briefly with O2 dropping to 83%. Education provided on keeping O2 on and incorporating pursed lip breathing into routine. Pt required MAX A +2 for bed mobility to transition from supine>sit. Utilized stedy for EOB>w/c transfer with pt needing MAX A +2 for sit<>stand to stedy. Pt transported to w/c with total A where pt completed additional sit<>stand from stedy with MOD A. Pt reports feeling much better sitting up. Pt left up in w/c with alarm belt activated; 3L O2 applied with O2 WNL and all needs within reach.   Therapy Documentation Precautions:  Precautions Precautions: Fall Restrictions Weight Bearing Restrictions: No General: General OT Amount of Missed Time: 7 Minutes Vital Signs:   Pain: Pt reports pain in Bilateral ankles 5/10; offered increased rest breaks and repositioning as pain mgmt strategy.   Therapy/Group: Individual Therapy  [name] 10/10/2019, 12:23 PM

## 2019-10-10 NOTE — Progress Notes (Signed)
Pharmacy Antibiotic Note  [name] is a 79 y.o. male admitted on 09/26/2019 with pneumonia and corynebacterium UTI.  Pharmacy has been consulted for vancomycin and cefepime dosing.  WBC down to 10.3, afebrile. CXR with large LLL opacity. On 3L O2. Patient feeling better since antibiotics started. Renal function stable. On 4/22, asked MD to consider narrowing from cefepime to ceftriaxone, decided to continue for now given long hospital stay and unknown bacteria.   4/23 VP 00:04 = 23 4/23 VT 10:21 = 11 AUC: 406 (Goal 400-550)  AUC is at the low end of goal. Vancomycin is primarily treating the urine corynebacterium and urine concentration is likely higher than in blood. Patient is at slightly higher risk of accumulation and nephrotoxicity given age. Thus will continue current vancomycin dose.     Plan: Vancomycin 750mg  Q12hr Cefepime 2g Q8hr F/u clinical status, renal fx, end date     Height: 5\' 7"  (170.2 cm) Weight: 95 kg (209 lb 7 oz) IBW/kg (Calculated) : 66.1  Temp (24hrs), Avg:98.7 F (37.1 C), Min:98.3 F (36.8 C), Max:99 F (37.2 C)  Recent Labs  Lab 10/06/19 0537 10/08/19 1455 10/09/19 0721 10/10/19 0004 10/10/19 0545  WBC 14.7* 18.6* 15.3*  --  10.3  CREATININE 0.79 0.91 0.87  --  0.81  VANCOPEAK  --   --   --  23*  --     Estimated Creatinine Clearance: 82.6 mL/min (by C-G formula based on SCr of 0.81 mg/dL).    No Known Allergies  Antimicrobials this admission: Vanc 4/21>> Cefe 4/21>>    Microbiology results: 4/20 UCx: corynebacterium 4/20 UA: large Leuk, WBC >50 4/20 MRSA PCR: positive 3/31 BCx ngtd 3/31 UCx + 30K corynebacter   Thank you for allowing pharmacy to be a part of this patient's care.  Benetta Spar, PharmD, BCPS, BCCP Clinical Pharmacist  Please check AMION for all [place] phone numbers After 10:00 PM, call [name] [phone]

## 2019-10-10 NOTE — Progress Notes (Signed)
[place] PHYSICAL MEDICINE & REHABILITATION PROGRESS NOTE    Subjective:  Pt reports not getting CPAP machine put on him at night- and the last time WAS put on, wasn't blowing air when took off.   Breathing "OK" but nose stopped up- used to take afrin 2x/day for months if not years- so wants it back.   LBM >1 day ago, but hasn't received Miralax- explained miralax is SCHDULED 2x/day so if doesn't get it, must be refusing it- pt said "haven't refused it but didn't get it".   Doesn't feel good this AM Discussed issue with CPAP with Resp therapy.    ROS:  Pt denies SOB, abd pain, CP, N/V/C/D, and vision changes  Objective:   No results found. Recent Labs    10/09/19 0721 10/10/19 0545  WBC 15.3* 10.3  HGB 11.8* 11.2*  HCT 36.3* 34.2*  PLT 159 153   Recent Labs    10/09/19 0721 10/10/19 0545  NA 137 140  K 3.8 3.6  CL 99 101  CO2 27 28  GLUCOSE 99 97  BUN 15 15  CREATININE 0.87 0.81  CALCIUM 8.7* 8.6*    Intake/Output Summary (Last 24 hours) at 10/10/2019 0844 Last data filed at 10/10/2019 0425 Gross per 24 hour  Intake 325 ml  Output 1900 ml  Net -1575 ml     Physical Exam: Vital Signs Blood pressure 127/71, pulse 88, temperature 98.7 F (37.1 C), temperature source Oral, resp. rate 19, height 5\' 7"  (1.702 m), weight 95 kg, SpO2 92 %. Constitutional:alert, sitting up/slumped in bed; sounds coarse aduibly, NAD Conjugate gaze  Cardiovascular: RRR Respiratory:before nebs (less coarse/less wheezing than after nebs yesterday) [id number], NT, ND, (+)BS  Musculoskeletal:  Cervical back: Normal range of motion.  Neurological:Ox3 .CN exam intact. UE grossly 5/5 with trace to 1+ DTR's. RLE 4/5 prox to distal with pain inhibition. LLE: 2+ to 3+ prox to distal with more pain inhibition. Sensation to LT/temperature decreased in LLE up to nearly umbilicus. DTR's in LE surprisingly brisk at 2+ patella/achilles.  LLE jumping throughout interview- but less than  before last few days Skin: laceration right index finger, abrasions healing well on left knee,  L lateral malleolus dime sized- pink, a little red around wound- ~ 2-74mm deep; little slough- no change on exam PRAFOs in place, but since frog legged, lateral maleolus on bed . Buttocks slightly red, moist, no breakdown.  Psychiatric:pt a little grumpy- not sleeping well  Assessment/Plan: 1. Functional deficits secondary to lumbar spinal stenosis with neurogenic claudication which require 3+ hours per day of interdisciplinary therapy in a comprehensive inpatient rehab setting.  Physiatrist is providing close team supervision and 24 hour management of active medical problems listed below.  Physiatrist and rehab team continue to assess barriers to discharge/monitor patient progress toward functional and medical goals  Care Tool:  Bathing    Body parts bathed by patient: Right arm, Left arm, Chest, Abdomen, Front perineal area, Right upper leg, Left upper leg, Right lower leg, Left lower leg, Face   Body parts bathed by helper: Buttocks     Bathing assist Assist Level: Minimal Assistance - Patient > 75%     Upper Body Dressing/Undressing Upper body dressing   What is the patient wearing?: Pull over shirt    Upper body assist Assist Level: Supervision/Verbal cueing    Lower Body Dressing/Undressing Lower body dressing      What is the patient wearing?: Pants     Lower body assist Assist for  lower body dressing: Total Assistance - Patient < 25%     Toileting Toileting    Toileting assist Assist for toileting: Dependent - Patient 0%     Transfers Chair/bed transfer  Transfers assist     Chair/bed transfer assist level: Dependent - mechanical lift     Locomotion Ambulation   Ambulation assist   Ambulation activity did not occur: Safety/medical concerns  Assist level: 2 helpers Assistive device: Parallel bars Max distance: 5'   Walk 10 feet  activity   Assist  Walk 10 feet activity did not occur: Safety/medical concerns        Walk 50 feet activity   Assist Walk 50 feet with 2 turns activity did not occur: Safety/medical concerns         Walk 150 feet activity   Assist Walk 150 feet activity did not occur: Safety/medical concerns         Walk 10 feet on uneven surface  activity   Assist Walk 10 feet on uneven surfaces activity did not occur: Safety/medical concerns         Wheelchair     Assist Will patient use wheelchair at discharge?: Yes Type of Wheelchair: Manual    Wheelchair assist level: Supervision/Verbal cueing Max wheelchair distance: 150'    Wheelchair 50 feet with 2 turns activity    Assist        Assist Level: Supervision/Verbal cueing   Wheelchair 150 feet activity     Assist      Assist Level: Supervision/Verbal cueing   Blood pressure 127/71, pulse 88, temperature 98.7 F (37.1 C), temperature source Oral, resp. rate 19, height 5\' 7"  (1.702 m), weight 95 kg, SpO2 92 %.    Medical Problem List and Plan: 1.Decreased functional ability due to lumbar stenosis/radiculopathy with neurogenic claudication. MRI's also suggest (albeit with motion artifact) distal thoracic involvement as well.  Exam is more c/w this too.              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             -consider re-imaging of thoracic spine (perhaps with sedation) to better look at thoracic cord.  4/13- will give pt PRAFOs B/L to prevent heel/ankle skin breakdown at night  4/15- ordered new PRAFOS with kickstands- since original ones didn't have them- they are usually standard.   4/16- PRAFOs with kickstands came- not helping because pt sits with hips externally rotated  4/18-  sitting frog legged= can't fix issue with this position             -Continue CIR PT, OT -patient may shower -ELOS/Goals: 14-18 days, supervision to min assist with PT,OT 2. Antithrombotics: -DVT/anticoagulation:Lovenox. Check vascular study  4/12- Dopplers (-)- con't lovenox -antiplatelet therapy: Aspirin 81 mg daily 3. Pain Management:Baclofen 15 mg 3 times daily, Neurontin 100 mg nightly, OxyContin 10 mg every 12 hours, hydrocodone as needed as well as Imitrex for headaches -adjust regimen as needed based on efficacy, wean as possible             -oral steroids to taper  -pain present in therapy yesterday and at rest this morning at left ankle wound site, wound assessed and healing well. Has not been taking hydrocodone; one dose  administered this morning.   4/13- will add Zanaflex 2 mg TID for spasticity  4/14- spasticity improved per pt  4/15- spasticity back to being a problem- will order Valium 5 mg BID prn for pt.    4/18- discussed dantrolene- will try 50 mg QHS- if makes too sleepy, can stop it.   4/19: Patient was very sleepy this morning. Will stop Dantrolene.  4/20- discussed ITB pump and possible trial- will call and see if at all possible to do while here. On doses of meds can tolerate, due to sedation, but cannot increase. Last took Valium yesterday AM.  4/21- discussed keppra- explained is not FDA approved for spasticity- will wait on him to get MRI- is ordered   4/22- waiting on MRI once pt feeling better from pneumonia.  4/23- today or Monday depending on how he feels.   4. Mood:Provide emotional support -antipsychotic agents: N/A 5. Neuropsych: This patientiscapable of making decisions on hisown behalf. 6. Skin/Wound Care:place steristrips over right index finger lac.                          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    4/15- ordered new PRAFOs to keep feet from turning out/rotating out  4/16- PRAFOs came with kickstands                         -foam dressing left knee 7. Fluids/Electrolytes/Nutrition:Routine in and outs with follow-up chemistries. Diet changed to regular as per patient's preference. He states he would not follow current diet at home as is unappealing to him. I discussed that our goal is to simulate home environment as medications are adjusted according to current labs/vitals. I discussed that goal of heart healthy diet is to decrease sodium and saturated fat to protect the heart and circulatory system. Discussed his current diet and advised that he cut back on sugary juices, which he likes. Recommended replacing with water. He asked about his [employer] which he and his wife drink a lot of at home. Advised that despite it having no calories, it has a lot of processed ingredients and his goal should be to minimize processed foods. He expressed understanding. Continue nutritional counseling.   4/19: Electrolytes stable on BMP today. Calcium low at 8.7. Will start calcium supplement.  8. Chronic restless leg syndrome. Sinemet 10-100 mg twice daily. 9. 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 OSA with CPAP/2 L chronic oxygen at home. Continue inhalers  4/23- checked with resp therapy and verified pt hasn't been receiving CPAP at night most nights- she will "work on it".  11. 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 Follow-up outpatient 12. Diabetes mellitus. Hemoglobin A1c 5.8. SSI. Changed to [facility] for CBG and SSI  4/17- pt refused to get BGs checked- stopped 4/12- A1c 5.8- 13. Neurogenic bowel and bladder. Check PVR  4/12- no BM since Friday- if no BM overnight,  will give Mg citrate and enema tomorrow.   4/13- had extra large BM last night- con't regimen  4/19- patient denies constipation.  4/21- last BM last night- refused bowel meds 14. Hypothyroidism. Synthroid 15. Chronic diastolic congestive heart failure.  Monitor for any signs of fluid overload. Check regular weights   Filed Weights   10/07/19 0546 10/09/19 0340 10/10/19 0423  Weight: 97.6 kg 97 kg 95 kg   4/13- will monitor for trends  4/14- up 0.9 kg in 1 day- if goes up again, will check BNP  4/15- Weight up another 1 kg- will check BNP and labs tomorrow  4/17- Weight back down slightly- will monitor- BNP only 45  4/18- weight down 1/2 kg- will con't regimen  4/21- weight stable- con't meds  4/22- Weight down to 97 kg- con't regimen  4/23- weight down to 95 kg- BMP looks ok 16. Hyperlipidemia. Zocor 17. Code status: Changed to DNR as per patient's wishes. 18. Leukocytosis - pneumonia-   4/12- on prednisone taper- the cause since afebrile and no Sx's of illness-con't steroid taper  4/20- WBC back up to 14.7- off prednisone-Tm 99.9 yesterday- will check U/A, U Cx,  and CXR  4/21-  U/A somewhat (+) and CXR also shows likely pneumonia- and (+) UTI as well- large leuk- negative nitrites- will start Vanc and Cefepime per pharmacy-  And ordered labs in AM  4/22- WBC went up to 18.6k- back down to 15.3 k- fever broke yesterday- feeling a little better- will check labs in AM for trend and then not over weekend, unless needed  4/23- WBC 10. 3k- better- con't Vanc/Cefepime 19. Wheezing  4/13- asked pt to ask for prn albuterol and added Flonase for allergies/cough  4/14- will add Albuterol nebs qam in addition to prn, since sounds wheezing every AM.  4/15- still wheezing- hadn't gotten albuterol yet this AM- will add allergy meds to see if helps  4/16- still wheezy but better.   4/17- after nebs in AM, sounds much better- encouraged pt to use prn Nebs       LOS: 14 days A FACE TO  FACE EVALUATION WAS PERFORMED  Gillie Crisci 10/10/2019, 8:44 AM

## 2019-10-10 NOTE — Progress Notes (Signed)
Occupational Therapy Weekly Progress Note  Patient Details  Name: [name] MRN: [id number] Date of Birth: 01/27/1941  Beginning of progress report period: October 03, 2019 End of progress report period: October 10, 2019  Pt progress has been minimal and inconsistent since last weekly progress note.  Pt diagnosed with pneumonia and has not been able to consistently participate in therapy.  Sitting balance EOB with CGA.  Transfers +2 max A in [place]. Pt unable to stand upright in [place] and [employer] is now being used. Will consider downgrading LTGs if pt does not improve over the next few days.  Pt currently requires max A for LB bathing/dressing tasks and min A for UB bathing/dressing tasks. Pt is dependent for toileting tasks. Discharge date was changed from 4/24 to 5/1. Will continue to assess during the coming week.    Patient continues to demonstrate the following deficits: muscle weakness, decreased cardiorespiratoy endurance, decreased coordination and decreased standing balance, decreased postural control and decreased balance strategies and therefore will continue to benefit from skilled OT intervention to enhance overall performance with BADL and iADL.  Patient progressing toward long term goals..  Continue plan of care.  OT Short Term Goals Week 3:  OT Short Term Goal 1 (Week 3): STG=LTG secondary to ELOS   [name] 10/10/2019, 9:57 AM

## 2019-10-11 ENCOUNTER — Inpatient Hospital Stay (HOSPITAL_COMMUNITY): Payer: Medicare Other

## 2019-10-11 MED ORDER — SODIUM CHLORIDE 0.9% FLUSH: 10.0000 mL | INTRAVENOUS | Status: DC | PRN

## 2019-10-11 MED ORDER — SODIUM CHLORIDE 0.9% FLUSH
10.0000 mL | Freq: Two times a day (BID) | INTRAVENOUS | Status: DC
  Administered 2019-10-12 – 2019-10-24 (×22): 10 mL
  Administered 2019-10-25: 15 mL
  Administered 2019-10-25 – 2019-10-28 (×6): 10 mL

## 2019-10-11 NOTE — Plan of Care (Signed)
  Problem: SCI BLADDER ELIMINATION Goal: RH STG MANAGE BLADDER WITH ASSISTANCE Description: STG Manage Bladder With min/mod Assistance Outcome: Not Progressing; patient has an incontinence bag connected to suction   Problem: SCI BOWEL ELIMINATION Goal: RH STG MANAGE BOWEL WITH ASSISTANCE Description: STG Manage Bowel with min Assistance. Outcome: Not Progressing; LBM 4/21; had miralax this morning

## 2019-10-11 NOTE — Progress Notes (Signed)
Occupational Therapy Session Note  Patient Details  Name: [name] MRN: [id number] Date of Birth: 06/15/1941  Today's Date: 10/11/2019 OT Individual Time: 0800-0816 OT Individual Time Calculation (min): 16 min    Short Term Goals: Week 1:  OT Short Term Goal 1 (Week 1): patient will complete bed mobility - rolling and supine to/from sitting with CS OT Short Term Goal 1 - Progress (Week 1): Met OT Short Term Goal 2 (Week 1): patient will complete functional transfers with min A using LRAD OT Short Term Goal 2 - Progress (Week 1): Progressing toward goal OT Short Term Goal 3 (Week 1): patient will complete bathing in shower with min A using assistive devices, shower bench and grab bars OT Short Term Goal 3 - Progress (Week 1): Met OT Short Term Goal 4 (Week 1): patient will complete LB dressing with mod A OT Short Term Goal 4 - Progress (Week 1): Progressing toward goal OT Short Term Goal 5 (Week 1): patient will complete toileting with mod A OT Short Term Goal 5 - Progress (Week 1): Progressing toward goal  Skilled Therapeutic Interventions/Progress Updates:    1;1. Pt received in bed with no [place] or CPAP on. Pt reporting feeling, "awful" and endorses SOB. O2 checked and pt satting at 81% on RA. [place] with 3L O2 and rebounds to 93% within 30 seconds. Pt reportins taking CPAP off himself to talk to MD, however did not apply [place] as couldn't reach or manage it. Edu re calling for A if needed as low O2 not healthy for pt and needing supplemental O2 to stay in normal limits. Pt verbalized understanding. Pt declines OOB activity d/t feeling fatigued and poor. OT set up breakfast for pt opening all containers and packages with emphasis on nutrition to help healing and improving energy level. Exited session with pt seated in bed, exit alarm on and call light in reach   Therapy Documentation Precautions:  Precautions Precautions: Fall Restrictions Weight Bearing Restrictions: No General:    Vital Signs: Therapy Vitals Temp: 98 F (36.7 C) Pulse Rate: 83 Resp: 17 BP: (!) 143/80 Patient Position (if appropriate): Lying Oxygen Therapy SpO2: 91 % O2 Device: CPAP Pain:   ADL: ADL Eating: Set up Where Assessed-Eating: Chair Grooming: Setup Where Assessed-Grooming: Sitting at sink Upper Body Bathing: Minimal assistance Where Assessed-Upper Body Bathing: Sitting at sink Lower Body Bathing: Maximal assistance Where Assessed-Lower Body Bathing: Bed level Upper Body Dressing: Minimal assistance Where Assessed-Upper Body Dressing: Sitting at sink Lower Body Dressing: Maximal assistance Where Assessed-Lower Body Dressing: Bed level Toileting: Maximal assistance Where Assessed-Toileting: Bed level ADL Comments: no urge to void during OT sessions, condom cath in am, brief wet and changed bed level during pm session Vision   Perception    Praxis   Exercises:   Other Treatments:     Therapy/Group: Individual Therapy   M  10/11/2019, 8:21 AM 

## 2019-10-11 NOTE — Progress Notes (Addendum)
[place] PHYSICAL MEDICINE & REHABILITATION PROGRESS NOTE    Subjective:  Pt mainly WC bound prior to admission, amb short dist with walker no more than 34ft   ROS:  Pt denies SOB, abd pain, CP, N/V/C/D, and vision changes  Objective:   No results found. Recent Labs    10/09/19 0721 10/10/19 0545  WBC 15.3* 10.3  HGB 11.8* 11.2*  HCT 36.3* 34.2*  PLT 159 153   Recent Labs    10/09/19 0721 10/10/19 0545  NA 137 140  K 3.8 3.6  CL 99 101  CO2 27 28  GLUCOSE 99 97  BUN 15 15  CREATININE 0.87 0.81  CALCIUM 8.7* 8.6*    Intake/Output Summary (Last 24 hours) at 10/11/2019 0743 Last data filed at 10/10/2019 1844 Gross per 24 hour  Intake 240 ml  Output --  Net 240 ml     Physical Exam: Vital Signs Blood pressure (!) 143/80, pulse 83, temperature 98 F (36.7 C), resp. rate 17, height 5\' 7"  (1.702 m), weight 96.2 kg, SpO2 91 %.  General: No acute distress Mood and affect are appropriate Heart: Regular rate and rhythm no rubs murmurs or extra sounds Lungs: Clear to auscultation, breathing unlabored, no rales or wheezes Abdomen: Positive bowel sounds, soft nontender to palpation, nondistended Extremities: No clubbing, cyanosis, or edema Skin: No evidence of breakdown, no evidence of rash Neurologic: Cranial nerves II through XII intact, motor strength is 4/5 in bilateral deltoid, bicep, tricep, grip,2- bilateral  hip flexor, knee extensors, ankle dorsiflexor and plantar flexor Sensory exam normal sensation to light touch and proprioception in bilateral upper and lower extremities  Musculoskeletal: Full range of motion in all 4 extremities. No joint swelling Hamstring contracture vs tone Assessment/Plan: 1. Functional deficits secondary to lumbar spinal stenosis with neurogenic claudication which require 3+ hours per day of interdisciplinary therapy in a comprehensive inpatient rehab setting.  Physiatrist is providing close team supervision and 24 hour  management of active medical problems listed below.  Physiatrist and rehab team continue to assess barriers to discharge/monitor patient progress toward functional and medical goals  Care Tool:  Bathing    Body parts bathed by patient: Right arm, Left arm, Chest, Abdomen, Front perineal area, Right upper leg, Left upper leg, Right lower leg, Left lower leg, Face   Body parts bathed by helper: Buttocks     Bathing assist Assist Level: Minimal Assistance - Patient > 75%     Upper Body Dressing/Undressing Upper body dressing   What is the patient wearing?: Pull over shirt    Upper body assist Assist Level: Supervision/Verbal cueing    Lower Body Dressing/Undressing Lower body dressing      What is the patient wearing?: Pants     Lower body assist Assist for lower body dressing: Total Assistance - Patient < 25%     Toileting Toileting    Toileting assist Assist for toileting: Dependent - Patient 0%     Transfers Chair/bed transfer  Transfers assist     Chair/bed transfer assist level: Dependent - mechanical lift(+2 use of stedy)     Locomotion Ambulation   Ambulation assist   Ambulation activity did not occur: Safety/medical concerns  Assist level: 2 helpers Assistive device: Parallel bars Max distance: 5'   Walk 10 feet activity   Assist  Walk 10 feet activity did not occur: Safety/medical concerns        Walk 50 feet activity   Assist Walk 50 feet with 2 turns activity  did not occur: Safety/medical concerns         Walk 150 feet activity   Assist Walk 150 feet activity did not occur: Safety/medical concerns         Walk 10 feet on uneven surface  activity   Assist Walk 10 feet on uneven surfaces activity did not occur: Safety/medical concerns         Wheelchair     Assist Will patient use wheelchair at discharge?: Yes Type of Wheelchair: Manual    Wheelchair assist level: Supervision/Verbal cueing Max wheelchair  distance: 62'    Wheelchair 50 feet with 2 turns activity    Assist        Assist Level: Supervision/Verbal cueing   Wheelchair 150 feet activity     Assist      Assist Level: Supervision/Verbal cueing   Blood pressure (!) 143/80, pulse 83, temperature 98 F (36.7 C), resp. rate 17, height 5\' 7"  (1.702 m), weight 96.2 kg, SpO2 91 %.    Medical Problem List and Plan: 1.Decreased functional ability due to lumbar stenosis/radiculopathy with neurogenic claudication. MRI's also suggest (albeit with motion artifact) distal thoracic involvement as well.  Exam is more c/w this too.              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 Antithrombotics: -DVT/anticoagulation:Lovenox.   4/12- Dopplers (-)- con't lovenox -antiplatelet therapy: Aspirin 81 mg daily 3. 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  4/23- today or Monday depending on how he feels.   4. Mood:Provide emotional support -antipsychotic agents: N/A 5. Neuropsych: This patientiscapable of making decisions on hisown behalf. 6. Skin/Wound Care:place steristrips over right index finger lac.                          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    4/15- ordered new PRAFOs to keep feet from turning out/rotating out  4/16- PRAFOs came with kickstands                         -foam dressing left knee 7. Fluids/Electrolytes/Nutrition:Routine in and outs with follow-up chemistries. Diet changed to regular as per patient's preference. He states he would not follow current diet at home as is unappealing to him. I discussed that our goal is to simulate home environment as medications are adjusted according to current labs/vitals. I discussed that goal of heart healthy diet is to decrease sodium and saturated fat to protect the heart and circulatory system. Discussed his current diet and advised that he cut  back on sugary juices, which he likes. Recommended replacing with water. He asked about his [employer] which he and his wife drink a lot of at home. Advised that despite it having no calories, it has a lot of processed ingredients and his goal should be to minimize processed foods. He expressed understanding. Continue nutritional counseling.   4/19: Electrolytes stable on BMP today. Calcium low at 8.7. Will start calcium supplement.  8. Chronic restless leg syndrome. Sinemet 10-100 mg twice daily. 9. 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 OSA with CPAP/2 L chronic oxygen at home. Continue inhalers  4/23- checked with resp therapy and verified pt hasn't been receiving CPAP at night most nights- she will "work on it".  11. 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 Follow-up outpatient 12. Diabetes mellitus. Hemoglobin A1c 5.8. SSI. Changed to [facility] HS for CBG and SSI  4/17- pt refused to get BGs checked- stopped 4/12- A1c 5.8- 13. Neurogenic bowel and bladder. Check PVR  4/12- no BM since Friday- if no BM overnight,  will give Mg citrate and enema tomorrow.   4/13- had extra large BM last night- con't regimen  4/19- patient denies constipation.  4/21- last BM last night- refused bowel meds 14. Hypothyroidism. Synthroid 15. Chronic diastolic congestive heart failure. Monitor for any signs of fluid overload. Check regular weights   Filed Weights   10/09/19 0340 10/10/19 0423 10/11/19 0500  Weight: 97 kg 95 kg 96.2 kg   4/13- will monitor for trends  4/14- up 0.9 kg in 1 day- if goes up again, will check BNP  4/15- Weight up another 1 kg- will check BNP and labs tomorrow  4/17- Weight back down slightly- will monitor- BNP only 45  4/18- weight down 1/2 kg- will con't regimen  4/21- weight stable- con't meds  4/22- Weight down to 97 kg- con't regimen  4/23- weight down to 95 kg- BMP looks ok 16. Hyperlipidemia. Zocor 17. Code status: Changed to DNR as per patient's wishes. 18. Leukocytosis - pneumonia-   4/12- on prednisone taper- the cause since afebrile and no Sx's of illness-con't steroid taper  4/20- WBC back up to 14.7- off prednisone-Tm 99.9 yesterday- will check U/A, U Cx,  and CXR  4/21-  U/A somewhat (+) and CXR also shows likely pneumonia- and (+) UTI as well- large leuk- negative nitrites- will start Vanc and Cefepime per pharmacy-  And ordered labs in AM  4/22- WBC went up to 18.6k- back down to 15.3 k- fever broke yesterday- feeling a little better- will check labs in AM for trend and then not over weekend, unless needed  4/23- WBC 10. 3k- better- con't Vanc/Cefepime, MRSA + UCx contaminants, BC x1 neg, day 3 abx 19. Wheezing  4/13- asked pt to ask for prn albuterol and added Flonase for allergies/cough  4/14- will add Albuterol nebs qam in addition to prn, since sounds wheezing every AM.  4/15- still wheezing- hadn't gotten albuterol yet this AM- will add allergy meds to see if helps  4/16- still wheezy but better.   4/17- after nebs in AM, sounds much better- encouraged pt to use  prn Nebs    20 Spasticity vs tone in hamstrings, chronically WC bound may be contracted  LOS: 15 days A FACE TO Amelia E Doye Montilla 10/11/2019, 7:43 AM

## 2019-10-12 ENCOUNTER — Inpatient Hospital Stay (HOSPITAL_COMMUNITY): Payer: Medicare Other | Admitting: Occupational Therapy

## 2019-10-12 MED ORDER — BISACODYL 10 MG RE SUPP
10.0000 mg | Freq: Once | RECTAL | Status: DC
  Filled 2019-10-12 (×3): qty 1

## 2019-10-12 NOTE — Progress Notes (Signed)
[place] PHYSICAL MEDICINE & REHABILITATION PROGRESS NOTE    Subjective:  Pt mainly WC bound prior to admission, amb short dist with walker no more than 62ft Hx of bowel inc for years, cathing for years as well    ROS:  Pt denies SOB, abd pain, CP, N/V/C/D, and vision changes  Objective:   No results found. Recent Labs    10/09/19 0721 10/10/19 0545  WBC 15.3* 10.3  HGB 11.8* 11.2*  HCT 36.3* 34.2*  PLT 159 153   Recent Labs    10/09/19 0721 10/10/19 0545  NA 137 140  K 3.8 3.6  CL 99 101  CO2 27 28  GLUCOSE 99 97  BUN 15 15  CREATININE 0.87 0.81  CALCIUM 8.7* 8.6*    Intake/Output Summary (Last 24 hours) at 10/12/2019 0610 Last data filed at 10/12/2019 0354 Gross per 24 hour  Intake 390 ml  Output 2500 ml  Net -2110 ml     Physical Exam: Vital Signs Blood pressure (!) 151/71, pulse 82, temperature 98.3 F (36.8 C), temperature source Oral, resp. rate 18, height 5\' 7"  (1.702 m), weight 96.2 kg, SpO2 92 %.   General: No acute distress Mood and affect are appropriate Heart: Regular rate and rhythm no rubs murmurs or extra sounds Lungs: Clear to auscultation, breathing unlabored, no rales or wheezes Abdomen: Positive bowel sounds, soft nontender to palpation, nondistended Extremities: No clubbing, cyanosis, or edema Skin: No evidence of breakdown, no evidence of rash  Musculoskeletal: Full range of motion in all 4 extremities. No joint swelling Neurologic: Cranial nerves II through XII intact, motor strength is 4/5 in bilateral deltoid, bicep, tricep, grip,2- bilateral  hip flexor, knee extensors, ankle dorsiflexor and plantar flexor Sensory exam normal sensation to light touch and proprioception in bilateral upper and lower extremities  Musculoskeletal: Full range of motion in all 4 extremities. No joint swelling Hamstring contracture vs tone Assessment/Plan: 1. Functional deficits secondary to lumbar spinal stenosis with neurogenic claudication  which require 3+ hours per day of interdisciplinary therapy in a comprehensive inpatient rehab setting.  Physiatrist is providing close team supervision and 24 hour management of active medical problems listed below.  Physiatrist and rehab team continue to assess barriers to discharge/monitor patient progress toward functional and medical goals  Care Tool:  Bathing    Body parts bathed by patient: Right arm, Left arm, Chest, Abdomen, Front perineal area, Right upper leg, Left upper leg, Right lower leg, Left lower leg, Face   Body parts bathed by helper: Buttocks     Bathing assist Assist Level: Minimal Assistance - Patient > 75%     Upper Body Dressing/Undressing Upper body dressing   What is the patient wearing?: Pull over shirt    Upper body assist Assist Level: Supervision/Verbal cueing    Lower Body Dressing/Undressing Lower body dressing      What is the patient wearing?: Pants     Lower body assist Assist for lower body dressing: Total Assistance - Patient < 25%     Toileting Toileting    Toileting assist Assist for toileting: Dependent - Patient 0%     Transfers Chair/bed transfer  Transfers assist     Chair/bed transfer assist level: Dependent - mechanical lift(+2 use of stedy)     Locomotion Ambulation   Ambulation assist   Ambulation activity did not occur: Safety/medical concerns  Assist level: 2 helpers Assistive device: Parallel bars Max distance: 5'   Walk 10 feet activity   Assist  Walk 10 feet activity did not occur: Safety/medical concerns        Walk 50 feet activity   Assist Walk 50 feet with 2 turns activity did not occur: Safety/medical concerns         Walk 150 feet activity   Assist Walk 150 feet activity did not occur: Safety/medical concerns         Walk 10 feet on uneven surface  activity   Assist Walk 10 feet on uneven surfaces activity did not occur: Safety/medical concerns          Wheelchair     Assist Will patient use wheelchair at discharge?: Yes Type of Wheelchair: Manual    Wheelchair assist level: Supervision/Verbal cueing Max wheelchair distance: 62'    Wheelchair 50 feet with 2 turns activity    Assist        Assist Level: Supervision/Verbal cueing   Wheelchair 150 feet activity     Assist      Assist Level: Supervision/Verbal cueing   Blood pressure (!) 151/71, pulse 82, temperature 98.3 F (36.8 C), temperature source Oral, resp. rate 18, height 5\' 7"  (1.702 m), weight 96.2 kg, SpO2 92 %.    Medical Problem List and Plan: 1.Decreased functional ability due to lumbar stenosis/radiculopathy with neurogenic claudication. MRI's also suggest (albeit with motion artifact) distal thoracic involvement as well.  Exam is more c/w this too.              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 Antithrombotics: -DVT/anticoagulation:Lovenox.   4/12- Dopplers (-)- con't lovenox -antiplatelet therapy: Aspirin 81 mg daily 3. 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   4. Mood:Provide emotional support -antipsychotic agents: N/A 5. Neuropsych: This patientiscapable of making decisions on hisown behalf. 6. Skin/Wound Care:place steristrips over right  index finger lac.                          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    4/15- ordered new PRAFOs to keep feet from turning out/rotating out  4/16- PRAFOs came with kickstands                         -foam dressing left knee 7. Fluids/Electrolytes/Nutrition:Routine in and outs with follow-up chemistries. Diet changed to regular as per patient's preference. He states he would not follow current diet at home as is unappealing to him. I discussed that our goal is to simulate home environment as medications are adjusted according to current labs/vitals. I discussed that goal of heart healthy diet  is to decrease sodium and saturated fat to protect the heart and circulatory system. Discussed his current diet and advised that he cut back on sugary juices, which he likes. Recommended replacing with water. He asked about his [employer] which he and his wife drink a lot of at home. Advised that despite it having no calories, it has a lot of processed ingredients and his goal should be to minimize processed foods. He expressed understanding. Continue nutritional counseling.   4/19: Electrolytes stable on BMP today. Calcium low at 8.7. Will start calcium supplement.  8. Chronic restless leg syndrome. Sinemet 10-100 mg twice daily. 9. 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 OSA with CPAP/2 L chronic oxygen at home. Continue inhalers  4/23- checked with resp therapy and verified pt hasn't been receiving CPAP at night most nights- she will "work on it".  11. 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 Follow-up outpatient 12. Diabetes mellitus. Hemoglobin A1c 5.8. SSI. Changed to [facility] HS for CBG and SSI  4/17-  pt refused to get BGs checked- stopped 4/12- A1c 5.8- 13. Neurogenic bowel and bladder. Check PVR  Last BM 4/24  MIralax and sorbitol, incont  14. Hypothyroidism. Synthroid 15. Chronic diastolic congestive heart failure. Monitor for any signs of fluid overload. Check regular weights   Filed Weights   10/09/19 0340 10/10/19 0423 10/11/19 0500  Weight: 97 kg 95 kg 96.2 kg   Weight in range from 95-97 kg- no SOB or increased edema  16. Hyperlipidemia. Zocor 17. Code status: Changed to DNR as per patient's wishes. 18. Leukocytosis - pneumonia-   4/12- on prednisone taper- the cause since afebrile and no Sx's of illness-con't steroid taper  4/20- WBC back up to 14.7- off prednisone-Tm 99.9 yesterday- will check U/A, U Cx,  and CXR  4/21-  U/A somewhat (+) and CXR also shows likely pneumonia- and (+) UTI as well- large leuk- negative nitrites- will start Vanc and Cefepime per pharmacy-  And ordered labs in AM  4/22- WBC went up to 18.6k- back down to 15.3 k- fever broke yesterday- feeling a little better- will check labs in AM for trend and then not over weekend, unless needed  4/23- WBC 10. 3k- better- con't Vanc/Cefepime, MRSA + UCx contaminants, BC x1 neg, day 4 abx 19. Wheezing  Resolved on abx, scheduled umeclidinium    20 Spasticity vs tone in hamstrings, chronically WC bound may be contracted  LOS: 16 days A FACE TO FACE EVALUATION WAS PERFORMED  Charlett Blake 10/12/2019, 6:10 AM

## 2019-10-12 NOTE — Plan of Care (Signed)
  Problem: SCI BLADDER ELIMINATION Goal: RH STG MANAGE BLADDER WITH ASSISTANCE Description: STG Manage Bladder With min/mod Assistance Outcome: Not Progressing; external male urine mgt to suction

## 2019-10-12 NOTE — Progress Notes (Signed)
Occupational Therapy Session Note  Patient Details  Name: [name] MRN: [id number] Date of Birth: 09/17/1940  Today's Date: 10/12/2019 OT Individual Time: 1120-1215 OT Individual Time Calculation (min): 55 min    Short Term Goals: Week 1:  OT Short Term Goal 1 (Week 1): patient will complete bed mobility - rolling and supine to/from sitting with CS OT Short Term Goal 1 - Progress (Week 1): Met OT Short Term Goal 2 (Week 1): patient will complete functional transfers with min A using LRAD OT Short Term Goal 2 - Progress (Week 1): Progressing toward goal OT Short Term Goal 3 (Week 1): patient will complete bathing in shower with min A using assistive devices, shower bench and grab bars OT Short Term Goal 3 - Progress (Week 1): Met OT Short Term Goal 4 (Week 1): patient will complete LB dressing with mod A OT Short Term Goal 4 - Progress (Week 1): Progressing toward goal OT Short Term Goal 5 (Week 1): patient will complete toileting with mod A OT Short Term Goal 5 - Progress (Week 1): Progressing toward goal Week 2:  OT Short Term Goal 1 (Week 2): STG=LTG secondary to ELOS  Skilled Therapeutic Interventions/Progress Updates:    Patient in bed, alert and ready for therapy session.  3L O2 via .  Noted pain in left ankle - level 5.  LB bathing and dressing completed bed level - max A/dep, rolling in bed max A.  Supine to sitting max A.  Sit to stand in stedy max A.  Mod A to maintain upright posture in stedy.  Completed grooming and UB bathing/dressing w.c level with mod/max A.  He is pleasant and cooperative t/o session and discouraged by limited endurance and strength.  He requested to stay OOB in w/c at close of session.  Call bell and tray table in reach.    Therapy Documentation Precautions:  Precautions Precautions: Fall Restrictions Weight Bearing Restrictions: No   Therapy/Group: Individual Therapy  [name] 10/12/2019, 7:41 AM

## 2019-10-13 ENCOUNTER — Inpatient Hospital Stay (HOSPITAL_COMMUNITY): Payer: Medicare Other

## 2019-10-13 ENCOUNTER — Inpatient Hospital Stay (HOSPITAL_COMMUNITY): Payer: Medicare Other | Admitting: Physical Therapy

## 2019-10-13 LAB — CBC WITH DIFFERENTIAL/PLATELET
Abs Immature Granulocytes: 0.11 10*3/uL — ABNORMAL HIGH (ref 0.00–0.07)
Basophils Absolute: 0.1 10*3/uL (ref 0.0–0.1)
Basophils Relative: 1 %
Eosinophils Absolute: 0.6 10*3/uL — ABNORMAL HIGH (ref 0.0–0.5)
Eosinophils Relative: 9 %
HCT: 36.4 % — ABNORMAL LOW (ref 39.0–52.0)
Hemoglobin: 11.6 g/dL — ABNORMAL LOW (ref 13.0–17.0)
Immature Granulocytes: 2 %
Lymphocytes Relative: 16 %
Lymphs Abs: 1 10*3/uL (ref 0.7–4.0)
MCH: 29.9 pg (ref 26.0–34.0)
MCHC: 31.9 g/dL (ref 30.0–36.0)
MCV: 93.8 fL (ref 80.0–100.0)
Monocytes Absolute: 0.7 10*3/uL (ref 0.1–1.0)
Monocytes Relative: 11 %
Neutro Abs: 3.8 10*3/uL (ref 1.7–7.7)
Neutrophils Relative %: 61 %
Platelets: 264 10*3/uL (ref 150–400)
RBC: 3.88 MIL/uL — ABNORMAL LOW (ref 4.22–5.81)
RDW: 14.6 % (ref 11.5–15.5)
WBC: 6.3 10*3/uL (ref 4.0–10.5)
nRBC: 0 % (ref 0.0–0.2)

## 2019-10-13 LAB — BASIC METABOLIC PANEL
Anion gap: 8 (ref 5–15)
BUN: 15 mg/dL (ref 8–23)
CO2: 31 mmol/L (ref 22–32)
Calcium: 8.5 mg/dL — ABNORMAL LOW (ref 8.9–10.3)
Chloride: 103 mmol/L (ref 98–111)
Creatinine, Ser: 0.86 mg/dL (ref 0.61–1.24)
GFR calc Af Amer: >60 mL/min (ref >60)
GFR calc non Af Amer: >60 mL/min (ref >60)
Glucose, Bld: 95 mg/dL (ref 70–99)
Potassium: 3.7 mmol/L (ref 3.5–5.1)
Sodium: 142 mmol/L (ref 135–145)

## 2019-10-13 LAB — CULTURE, BLOOD (SINGLE)
Culture: NO GROWTH
Special Requests: ADEQUATE

## 2019-10-13 MED ORDER — METHOCARBAMOL 500 MG PO TABS
500.0000 mg | ORAL_TABLET | Freq: Once | ORAL | Status: DC
  Filled 2019-10-13: qty 1

## 2019-10-13 NOTE — Plan of Care (Signed)
  Problem: Consults Goal: RH SPINAL CORD INJURY PATIENT EDUCATION Description:  See Patient Education module for education specifics.  Outcome: Progressing Goal: Skin Care Protocol Initiated - if Braden Score 18 or less Description: If consults are not indicated, leave blank or document N/A Outcome: Progressing   Problem: SCI BOWEL ELIMINATION Goal: RH STG MANAGE BOWEL WITH ASSISTANCE Description: STG Manage Bowel with min Assistance. Outcome: Progressing Goal: RH STG SCI MANAGE BOWEL PROGRAM W/ASSIST OR AS APPROPRIATE Description: STG SCI Manage bowel program w/ min assist or as appropriate. Outcome: Progressing   Problem: SCI BLADDER ELIMINATION Goal: RH STG MANAGE BLADDER WITH ASSISTANCE Description: STG Manage Bladder With min/mod Assistance Outcome: Progressing   Problem: RH SKIN INTEGRITY Goal: RH STG SKIN FREE OF INFECTION/BREAKDOWN Description: Patients skin will remain free from further infection or breakdown with mod assist. Outcome: Progressing Goal: RH STG MAINTAIN SKIN INTEGRITY WITH ASSISTANCE Description: STG Maintain Skin Integrity With mod Assistance. Outcome: Progressing Goal: RH STG ABLE TO PERFORM INCISION/WOUND CARE W/ASSISTANCE Description: STG Able To Perform Incision/Wound Care With total Assistance from caregiver. Outcome: Progressing   Problem: RH SAFETY Goal: RH STG ADHERE TO SAFETY PRECAUTIONS W/ASSISTANCE/DEVICE Description: STG Adhere to Safety Precautions With mod I Assistance/Device. Outcome: Progressing   Problem: RH PAIN MANAGEMENT Goal: RH STG PAIN MANAGED AT OR BELOW PT'S PAIN GOAL Description: < 4 Outcome: Progressing

## 2019-10-13 NOTE — Progress Notes (Signed)
Pharmacy Antibiotic Note  [name] is a 79 y.o. male admitted on 09/26/2019 with pneumonia and corynebacterium UTI.  Pharmacy has been consulted for vancomycin and cefepime dosing.  WBC down to 6.3, afebrile. CXR with large LLL opacity. Patient clinically improved since antibiotics. Renal function stable. MD agreed to stopping antibiotics after 7 days.    Plan: Vancomycin 750mg  Q12hr Cefepime 2g Q8hr End date 4/27 entered      Height: 5\' 7"  (170.2 cm) Weight: 97.9 kg (215 lb 13.3 oz) IBW/kg (Calculated) : 66.1  Temp (24hrs), Avg:99 F (37.2 C), Min:99 F (37.2 C), Max:99 F (37.2 C)  Recent Labs  Lab 10/08/19 1455 10/09/19 0721 10/10/19 0004 10/10/19 0545 10/10/19 1021 10/13/19 0431  WBC 18.6* 15.3*  --  10.3  --  6.3  CREATININE 0.91 0.87  --  0.81  --  0.86  VANCOTROUGH  --   --   --   --  11*  --   VANCOPEAK  --   --  23*  --   --   --     Estimated Creatinine Clearance: 78.9 mL/min (by C-G formula based on SCr of 0.86 mg/dL).    No Known Allergies  Antimicrobials this admission: Vanc 4/21>> 4/27 Cefe 4/21>> 4/27    Microbiology results: 4/20 UCx: corynebacterium 4/20 UA: large Leuk, WBC >50 4/20 MRSA PCR: positive 3/31 BCx ngtd 3/31 UCx + 30K corynebacter   Thank you for allowing pharmacy to be a part of this patient's care.  Benetta Spar, PharmD, BCPS, BCCP Clinical Pharmacist  Please check AMION for all [place] phone numbers After 10:00 PM, call [facility] [phone]

## 2019-10-13 NOTE — Plan of Care (Signed)
  Problem: RH SKIN INTEGRITY Goal: RH STG SKIN FREE OF INFECTION/BREAKDOWN Description: Patients skin will remain free from further infection or breakdown with mod assist. Outcome: Progressing Goal: RH STG MAINTAIN SKIN INTEGRITY WITH ASSISTANCE Description: STG Maintain Skin Integrity With mod Assistance. Outcome: Progressing Goal: RH STG ABLE TO PERFORM INCISION/WOUND CARE W/ASSISTANCE Description: STG Able To Perform Incision/Wound Care With total Assistance from caregiver. Outcome: Progressing   Problem: RH SAFETY Goal: RH STG ADHERE TO SAFETY PRECAUTIONS W/ASSISTANCE/DEVICE Description: STG Adhere to Safety Precautions With mod I Assistance/Device. Outcome: Progressing

## 2019-10-13 NOTE — Progress Notes (Signed)
Pt stating to have severe muscle spasms. Pt and family and nursing staff agreed that Valium makes him sedated. This Probation officer called on call to get another medication (methocarbamol), pt refused medication, stating "I don't want to experiment with other medication , I just want Baclofen".  Pt educated and encouraged to take medication, pt still refused. This writer told pt to talk to doctor in morning about increase or prn baclofen

## 2019-10-13 NOTE — Progress Notes (Signed)
Occupational Therapy Session Note  Patient Details  Name: [name] MRN: [id number] Date of Birth: Jul 23, 1940  Today's Date: 10/13/2019 OT Individual Time: 0215-0345 OT Individual Time Calculation (min): 90 min    Short Term Goals: Week 2:  OT Short Term Goal 1 (Week 2): STG=LTG secondary to ELOS  Skilled Therapeutic Interventions/Progress Updates:  Pt received seated in w/c with wife present agreeable to OT intervention.Pt on 2L O2 with O2 95% at start of session. Discussed POC with wife and pt with wife reporting wanting pt to be at [facility] for SB transfers upon DC home. Discussed pt CLOF with wife reporting pt had been given Valium today likely causing pt to be weak and drowsy. Pt complete household distance w/c propulsion to gym with pt reporting fatigue half way requesting OTA to take over. Attempted SB transfer from w/c>EOM with pt unable to lean trunk anteriorly to scoot hips across. Pt also noted to present with poor sitting balance leaning heavily to R side; pt reports that's" how I sit when I'm tired." Remainder of session focus on BLEs therex with pt needing assist to complete AROM knee flexion/extension on RLE. Pt completed 2 reps x10 seated marches with noticeable effort expended. Pt completed seated BUE therex where pt instructed to lean forward to retrieve therapy ball and reach ball overhead. Pt complete x2 reps and reports fatigue needing rest break. Pt report needing to void bowels urgently, therefore transported pt back to room with total A. Pt required +3 assist for clothing mgmt and pericare with pt ultimately needing to return to supine to ensure cleanliness post BM. Pt required MIN- MOD A for sitting balance on stedy with pt heavily leaning anteriorly. Pt also unable to actively position RLE onto foot plate without assist from therapist. Pt handed off to NTs to finish pericare from supine.  Therapy Documentation Precautions:  Precautions Precautions: Fall Restrictions Weight  Bearing Restrictions: No General:   Vital Signs: Therapy Vitals Temp: 97.8 F (36.6 C) Temp Source: Oral Pulse Rate: 78 Resp: 18 BP: (!) 142/65 Patient Position (if appropriate): Sitting Oxygen Therapy SpO2: 96 % O2 Device: Nasal Cannula Pain: Pt reports unrated pain in BLEs; utilized repositioning as pain mgmt strategy.   Therapy/Group: Individual Therapy  [name] 10/13/2019, 4:09 PM

## 2019-10-13 NOTE — Progress Notes (Signed)
Occupational Therapy Session Note  Patient Details  Name: [name] MRN: [id number] Date of Birth: 02/16/1941  Today's Date: 10/13/2019 OT Individual Time: 0900-1000 OT Individual Time Calculation (min): 60 min    Short Term Goals: Week 1:  OT Short Term Goal 1 (Week 1): patient will complete bed mobility - rolling and supine to/from sitting with CS OT Short Term Goal 1 - Progress (Week 1): Met OT Short Term Goal 2 (Week 1): patient will complete functional transfers with min A using LRAD OT Short Term Goal 2 - Progress (Week 1): Progressing toward goal OT Short Term Goal 3 (Week 1): patient will complete bathing in shower with min A using assistive devices, shower bench and grab bars OT Short Term Goal 3 - Progress (Week 1): Met OT Short Term Goal 4 (Week 1): patient will complete LB dressing with mod A OT Short Term Goal 4 - Progress (Week 1): Progressing toward goal OT Short Term Goal 5 (Week 1): patient will complete toileting with mod A OT Short Term Goal 5 - Progress (Week 1): Progressing toward goal Week 2:  OT Short Term Goal 1 (Week 2): STG=LTG secondary to ELOS  Skilled Therapeutic Interventions/Progress Updates:  Pt received supine in bed on 2L O2 with O2 >93% throughout session. Pt reports pain in BLEs; completed PROM to BLEs to assist with pain mgmt. Session focus on BADL retraining and functional mobility. Overall, pt required MAX A +2 for bed mobility to transition from supine>EOB needing most assist to elevate trunk into sitting. Pt noted to present with decreased sitting balance leaning heavily to R this session needing MIN- MOD A and BUE support to maintain upright posture despite MAX cues for BUE placement and overall body mechanics. Pt required MAX A for UB dressing d/t poor sitting balance and total A for LB Dressing from supine. Pt required +3 assist for sit<>stand to stedy; total A to transport to w/c. Pt noted to be incontinent of bladder during transfer needing +3  assist to complete x3 sit<>stand to don brief and shorts with total A. Pt again noted to lean R when sitting in w/c needing assist to reposition trunk to midline. Pt left seated in w/c with alarm belt activated and all needs within reach.   Therapy Documentation Precautions:  Precautions Precautions: Fall Restrictions Weight Bearing Restrictions: No General:   Vital Signs: Therapy Vitals Pulse Rate: 87 Resp: 18 Patient Position (if appropriate): Lying Oxygen Therapy SpO2: 95 % O2 Device: Room Air Pain: Pt reports unrated pain in L ankle and BLEs; utilized repositioning and PROM as pain mgmt strategy.   Therapy/Group: Individual Therapy  [name] 10/13/2019, 12:06 PM

## 2019-10-13 NOTE — Progress Notes (Signed)
[name] PHYSICAL MEDICINE & REHABILITATION PROGRESS NOTE    Subjective:  Pt reports feeling a little better this AM- had OK weekend except for again, this AM, gets upset when woken up so early.- Explained that's hospital, can't fix it.   Bowels OK- thought someone reduced Baclofen over the weekend- was NOT reduced.  Said Baclofen doesn't make him sleepy.     ROS:  Pt denies SOB, abd pain, CP, N/V/C/D, and vision changes   Objective:   No results found. Recent Labs    10/13/19 0431  WBC 6.3  HGB 11.6*  HCT 36.4*  PLT 264   Recent Labs    10/13/19 0431  NA 142  K 3.7  CL 103  CO2 31  GLUCOSE 95  BUN 15  CREATININE 0.86  CALCIUM 8.5*    Intake/Output Summary (Last 24 hours) at 10/13/2019 1013 Last data filed at 10/13/2019 0911 Gross per 24 hour  Intake 420 ml  Output 2150 ml  Net -1730 ml     Physical Exam: Vital Signs Blood pressure 137/73, pulse 87, temperature 99 F (37.2 C), resp. rate 18, height 5\' 7"  (1.702 m), weight 97.9 kg, SpO2 95 %.   General: sitting up in bed; hasn't had nebs tx yet, NAD A little frustrated again this AM Heart: RRR Lungs: coarse, wheezing- good air movement- hasn't had Nebs yet this AM- sounds pretty normal for him before nebs Abdomen: soft, NT, ND for him= protuberant, (+)BS Extremities: No clubbing, cyanosis, or edema Skin: No evidence of breakdown, no evidence of rash  Musculoskeletal: Full range of motion in all 4 extremities. No joint swelling Neurologic: Cranial nerves II through XII intact, motor strength is 4/5 in bilateral deltoid, bicep, tricep, grip,2- bilateral  hip flexor, knee extensors, ankle dorsiflexor and plantar flexor Sensory exam normal sensation to light touch and proprioception in bilateral upper and lower extremities  Musculoskeletal: Full range of motion in all 4 extremities. No joint swelling Hamstring tone seen vs contracture    Assessment/Plan: 1. Functional deficits secondary to lumbar  spinal stenosis with neurogenic claudication which require 3+ hours per day of interdisciplinary therapy in a comprehensive inpatient rehab setting.  Physiatrist is providing close team supervision and 24 hour management of active medical problems listed below.  Physiatrist and rehab team continue to assess barriers to discharge/monitor patient progress toward functional and medical goals  Care Tool:  Bathing    Body parts bathed by patient: Right arm, Left arm, Chest, Abdomen, Front perineal area, Right upper leg, Left upper leg, Right lower leg, Left lower leg, Face   Body parts bathed by helper: Buttocks     Bathing assist Assist Level: Minimal Assistance - Patient > 75%     Upper Body Dressing/Undressing Upper body dressing   What is the patient wearing?: Pull over shirt    Upper body assist Assist Level: Supervision/Verbal cueing    Lower Body Dressing/Undressing Lower body dressing      What is the patient wearing?: Pants     Lower body assist Assist for lower body dressing: Total Assistance - Patient < 25%     Toileting Toileting    Toileting assist Assist for toileting: Dependent - Patient 0%     Transfers Chair/bed transfer  Transfers assist     Chair/bed transfer assist level: Dependent - mechanical lift(+2 use of stedy)     Locomotion Ambulation   Ambulation assist   Ambulation activity did not occur: Safety/medical concerns  Assist level: 2 helpers Assistive  device: Parallel bars Max distance: 5'   Walk 10 feet activity   Assist  Walk 10 feet activity did not occur: Safety/medical concerns        Walk 50 feet activity   Assist Walk 50 feet with 2 turns activity did not occur: Safety/medical concerns         Walk 150 feet activity   Assist Walk 150 feet activity did not occur: Safety/medical concerns         Walk 10 feet on uneven surface  activity   Assist Walk 10 feet on uneven surfaces activity did not occur:  Safety/medical concerns         Wheelchair     Assist Will patient use wheelchair at discharge?: Yes Type of Wheelchair: Manual    Wheelchair assist level: Supervision/Verbal cueing Max wheelchair distance: 34'    Wheelchair 50 feet with 2 turns activity    Assist        Assist Level: Supervision/Verbal cueing   Wheelchair 150 feet activity     Assist      Assist Level: Supervision/Verbal cueing   Blood pressure 137/73, pulse 87, temperature 99 F (37.2 C), resp. rate 18, height 5\' 7"  (1.702 m), weight 97.9 kg, SpO2 95 %.    Medical Problem List and Plan: 1.Decreased functional ability due to lumbar stenosis/radiculopathy with neurogenic claudication. MRI's also suggest (albeit with motion artifact) distal thoracic involvement as well.  Exam is more c/w this too.              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 Antithrombotics: -DVT/anticoagulation:Lovenox.   4/12- Dopplers (-)- con't lovenox -antiplatelet therapy: Aspirin 81 mg daily 3. 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  4/26- MRI hopefully today   4. Mood:Provide emotional support -antipsychotic agents: N/A 5. Neuropsych: This patientiscapable of making decisions on hisown behalf. 6. Skin/Wound  Care:place steristrips over right index finger lac.                          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    4/15- ordered new PRAFOs to keep feet from turning out/rotating out  4/16- PRAFOs came with kickstands                         -foam dressing left knee 7. Fluids/Electrolytes/Nutrition:Routine in and outs with follow-up chemistries. Diet changed to regular as per patient's preference. He states he would not follow current diet at home as is unappealing to him. I discussed that our goal is to simulate home  environment as medications are adjusted according to current labs/vitals. I discussed that goal of heart healthy diet is to decrease sodium and saturated fat to protect the heart and circulatory system. Discussed his current diet and advised that he cut back on sugary juices, which he likes. Recommended replacing with water. He asked about his [employer] which he and his wife drink a lot of at home. Advised that despite it having no calories, it has a lot of processed ingredients and his goal should be to minimize processed foods. He expressed understanding. Continue nutritional counseling.   4/19: Electrolytes stable on BMP today. Calcium low at 8.7. Will start calcium supplement.  8. Chronic restless leg syndrome. Sinemet 10-100 mg twice daily. 9. 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 OSA with CPAP/2 L chronic oxygen at home. Continue inhalers  4/23- checked with resp therapy and verified pt hasn't been receiving CPAP at night most nights- she will "work on it".  11. 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 Follow-up outpatient 12. Diabetes mellitus. Hemoglobin A1c 5.8. SSI. Changed  to [facility] for CBG and SSI  4/17- pt refused to get BGs checked- stopped 4/12- A1c 5.8- 13. Neurogenic bowel and bladder. Check PVR  Last BM 4/24  MIralax and sorbitol, incont  14. Hypothyroidism. Synthroid 15. Chronic diastolic congestive heart failure. Monitor for any signs of fluid overload. Check regular weights   Filed Weights   10/10/19 0423 10/11/19 0500 10/13/19 0703  Weight: 95 kg 96.2 kg 97.9 kg   Weight in range from 95-97 kg- no SOB or increased edema  16. Hyperlipidemia. Zocor 17. Code status: Changed to DNR as per patient's wishes. 18. Leukocytosis - pneumonia-   4/12- on prednisone taper- the cause since afebrile and no Sx's of illness-con't steroid taper  4/20- WBC back up to 14.7- off prednisone-Tm 99.9 yesterday- will check U/A, U Cx,  and CXR  4/21-  U/A somewhat (+) and CXR also shows likely pneumonia- and (+) UTI as well- large leuk- negative nitrites- will start Vanc and Cefepime per pharmacy-  And ordered labs in AM  4/22- WBC went up to 18.6k- back down to 15.3 k- fever broke yesterday- feeling a little better- will check labs in AM for trend and then not over weekend, unless needed  4/23- WBC 10. 3k- better- con't Vanc/Cefepime, MRSA + UCx contaminants, BC x1 neg, day 4 abx  4/26- Still on O2 but sounds better- con't ABX 19. Wheezing  Resolved on abx, scheduled umeclidinium    20 Spasticity vs tone in hamstrings, chronically WC bound may be contracted    LOS: 17 days A FACE TO FACE EVALUATION WAS PERFORMED  [name] 10/13/2019, 10:13 AM

## 2019-10-13 NOTE — Progress Notes (Signed)
Physical Therapy Session Note  Patient Details  Name: [name] MRN: [id number] Date of Birth: March 08, 1941  Today's Date: 10/13/2019 PT Individual Time: 1100-1200 PT Individual Time Calculation (min): 60 min   Short Term Goals: Week 3:  PT Short Term Goal 1 (Week 3): =LTG due to ELOS  Skilled Therapeutic Interventions/Progress Updates:    Pt received seated in w/c in room, agreeable to PT session. No complaints of pain. Pt does report feeling "very weak" this AM but is motivated to participate in therapy session. Pt initially on 3L O2, SpO2 99% at rest. SpO2 remains at 96% and above with activity, decreased O2 to 2L. While on 2L O2 SpO2 remains at 95% and above with activity. Manual w/c propulsion 2 x 25 ft with use of BUE at Supervision level before onset of fatigue. Attempt to set up slide board transfer, pt exhibits fair sitting balance and unable to maintain upright sitting without min A for trunk control. Unsafe to attempt slide board transfer this session due to pt fatigue level and decreased sitting balance. Discussed pt's LTG and what level of care his wife will be able to provide for him at home. Pt motivated to reach a greater level of independence but remains limited by decreased overall strength and endurance. Seated BUE strengthening therex with 1# dowel rod: bicep curls, chest press 2 x 10-15 reps each to fatigue. Pt left seated in w/c in room with needs in reach, quick release belt and chair alarm in place at end of session.  Therapy Documentation Precautions:  Precautions Precautions: Fall Restrictions Weight Bearing Restrictions: No    Therapy/Group: Individual Therapy   Excell Seltzer, PT, DPT  10/13/2019, 12:45 PM

## 2019-10-14 ENCOUNTER — Inpatient Hospital Stay (HOSPITAL_COMMUNITY): Payer: Medicare Other | Admitting: Physical Therapy

## 2019-10-14 ENCOUNTER — Inpatient Hospital Stay (HOSPITAL_COMMUNITY): Payer: Medicare Other

## 2019-10-14 ENCOUNTER — Inpatient Hospital Stay (HOSPITAL_COMMUNITY): Payer: Medicare Other | Admitting: Occupational Therapy

## 2019-10-14 ENCOUNTER — Inpatient Hospital Stay (HOSPITAL_COMMUNITY): Payer: Medicare Other | Admitting: *Deleted

## 2019-10-14 DIAGNOSIS — J9601 Acute respiratory failure with hypoxia: Secondary | ICD-10-CM

## 2019-10-14 LAB — PROCALCITONIN: Procalcitonin: <0.1 ng/mL

## 2019-10-14 IMAGING — DX DG CHEST 1V PORT
1 series · 1 of 1 positions shown · non-contrast
Comparison: [DATE].

CLINICAL DATA: Shortness of breath, cough.

EXAM:
PORTABLE CHEST 1 VIEW

[chest ap]
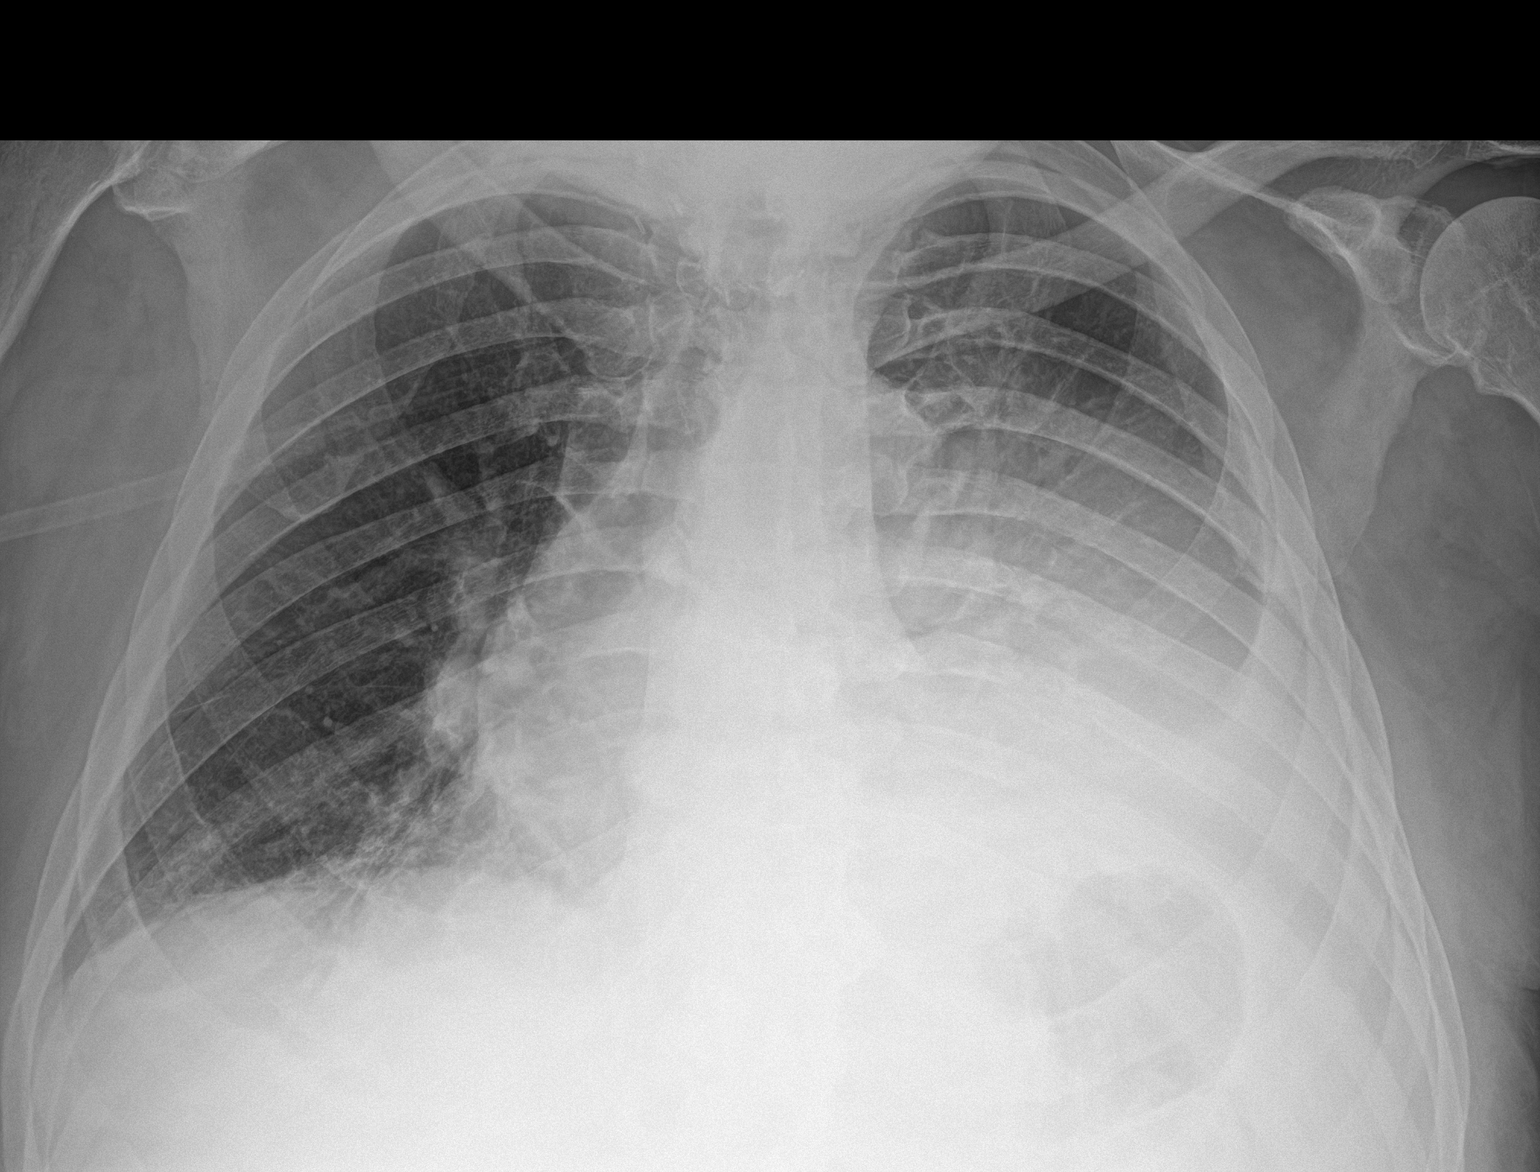

[1 of 1 positions shown; findings below may reference images not displayed]

FINDINGS: Stable cardiomediastinal silhouette. No pneumothorax is noted.
Minimal right basilar subsegmental atelectasis is noted. Moderate
left pleural effusion is noted with associated left basilar
atelectasis or infiltrate. Bony thorax is unremarkable.
IMPRESSION: Moderate left pleural effusion is noted with associated left basilar
atelectasis or infiltrate. Minimal right basilar subsegmental
atelectasis is noted.

## 2019-10-14 IMAGING — RF DG SNIFF TEST
3 series · 4 of 4 positions shown · non-contrast
Comparison: Chest today

CLINICAL DATA: Evaluate left diaphragm function. Question pneumonia

EXAM:
CHEST FLUOROSCOPY
TECHNIQUE: Real-time fluoroscopic evaluation of the chest was performed.
FLUOROSCOPY TIME:  Fluoroscopy Time:  1 minutes 18 seconds
Radiation Exposure Index (if provided by the fluoroscopic device):
Number of Acquired Spot Images: 0

[Series 1: fluoro_chest_bb · 0.18mm/px · 2 of 2 frames shown (1 of 3)]
[frame 1/2]
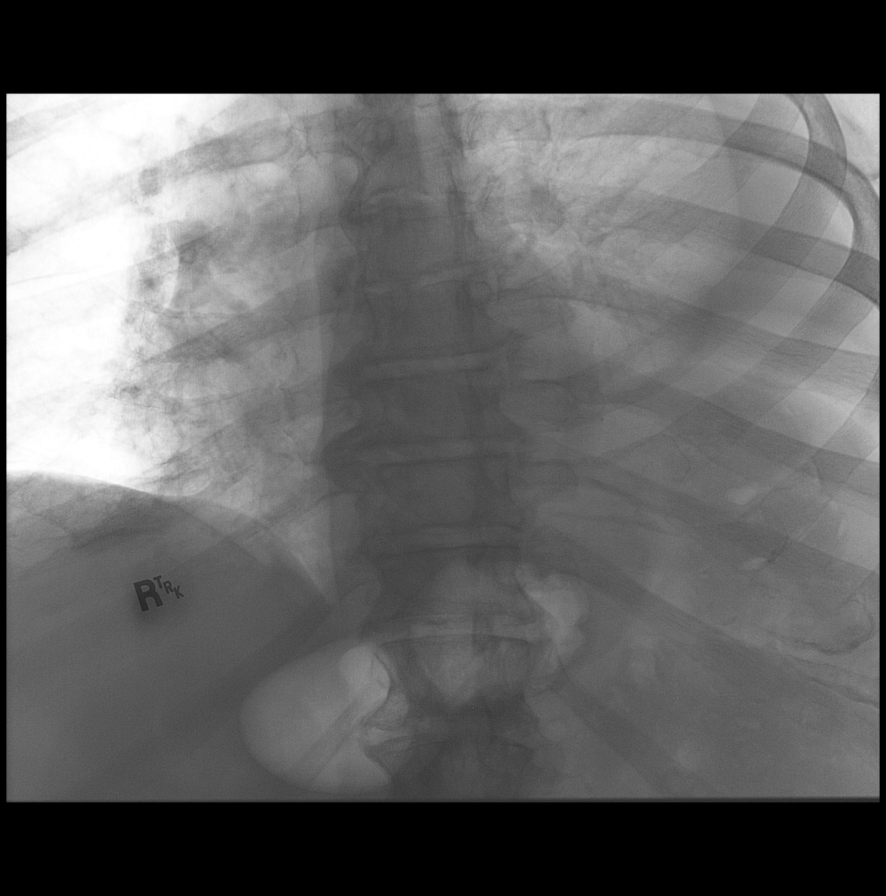
[frame 2/2]
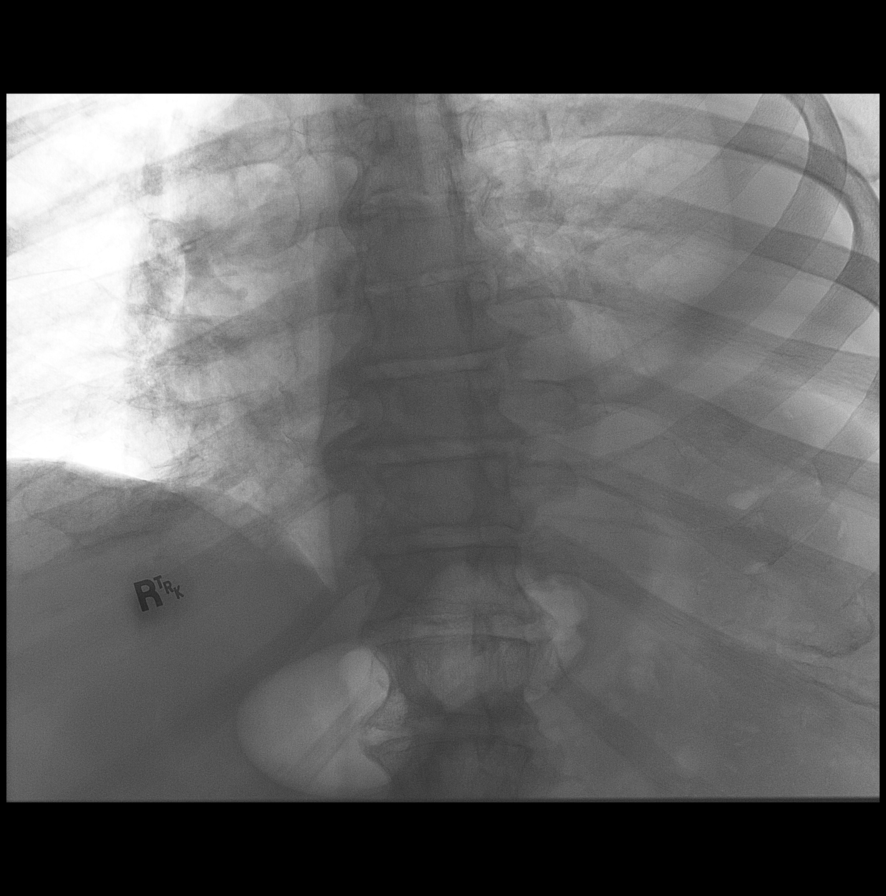

[Series 2: fluoro_chest_bb · 0.17mm/px · 1 of 1 slices shown (2 of 3)]
[im 1/1]
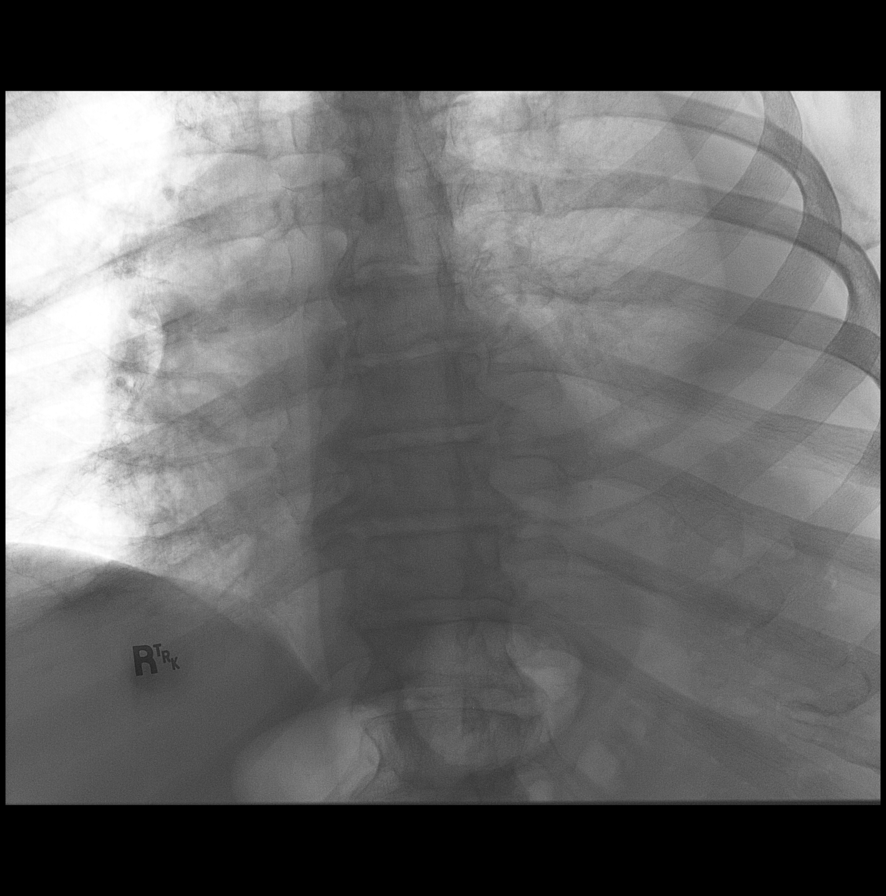

[Series 3: fluoro_chest_bb · 0.17mm/px · 1 of 1 slices shown (3 of 3)]
[im 1/1]
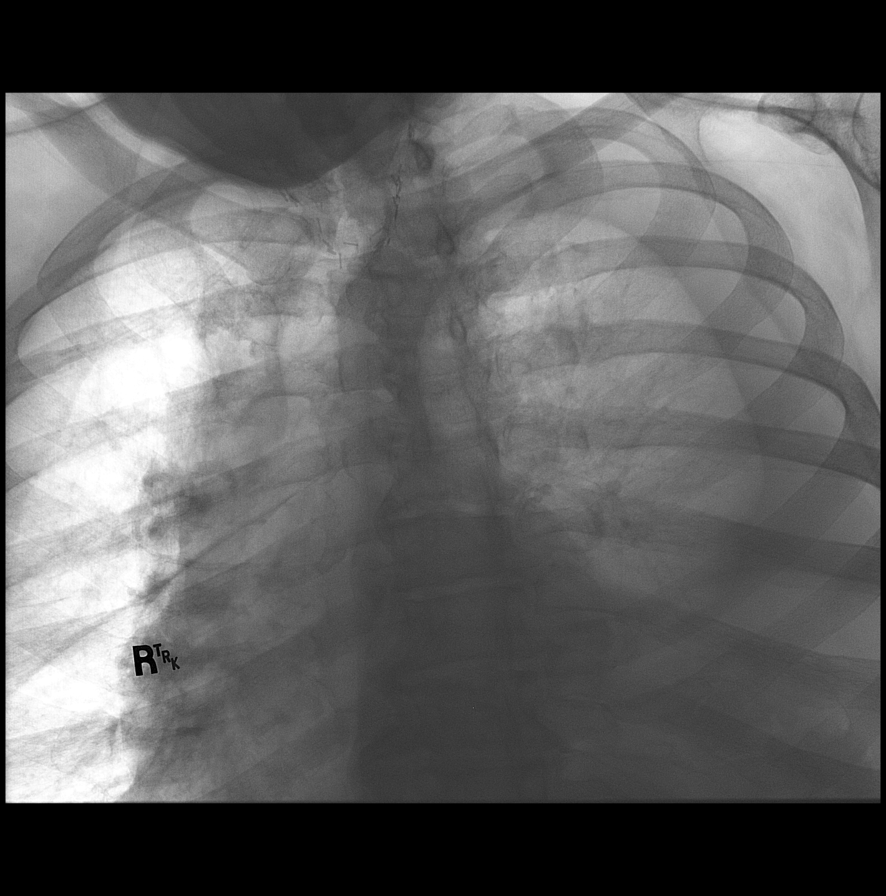

[4 of 4 positions shown; findings below may reference images not displayed]

FINDINGS: Normal motion of the right hemidiaphragm with inspiration and
sniffing.

Paradoxical elevation left hemidiaphragm during sniffing compatible
with paresis. There is left lower lobe consolidation and left
effusion which is layering.
IMPRESSION: Paradoxical movement of left hemidiaphragm with sniffing consistent
with paresis.

Layering left effusion and left lower lobe consolidation.

Normal movement of right diaphragm.

## 2019-10-14 MED ORDER — SODIUM CHLORIDE 3 % IN NEBU
4.0000 mL | INHALATION_SOLUTION | Freq: Two times a day (BID) | RESPIRATORY_TRACT | Status: AC
  Administered 2019-10-14 – 2019-10-16 (×3): 4 mL via RESPIRATORY_TRACT
  Filled 2019-10-14 (×6): qty 4

## 2019-10-14 MED ORDER — BACLOFEN 5 MG HALF TABLET
15.0000 mg | ORAL_TABLET | Freq: Three times a day (TID) | ORAL | Status: DC
  Administered 2019-10-14 – 2019-10-20 (×15): 15 mg via ORAL
  Filled 2019-10-14 (×15): qty 1

## 2019-10-14 MED ORDER — BACLOFEN 5 MG HALF TABLET: 15.0000 mg | ORAL_TABLET | Freq: Four times a day (QID) | ORAL | Status: DC

## 2019-10-14 MED ORDER — BACLOFEN 20 MG PO TABS
20.0000 mg | ORAL_TABLET | Freq: Every day | ORAL | Status: DC
  Administered 2019-10-14 – 2019-10-17 (×4): 20 mg via ORAL
  Filled 2019-10-14 (×6): qty 1

## 2019-10-14 MED ORDER — LIDOCAINE HCL URETHRAL/MUCOSAL 2 % EX GEL: CUTANEOUS | Status: DC | PRN

## 2019-10-14 NOTE — Procedures (Signed)
Patient instructed on the use of an IS and Flutter.  Left at bedside for patient to use.

## 2019-10-14 NOTE — Progress Notes (Signed)
Physical Therapy Session Note  Patient Details  Name: [name] MRN: [id number] Date of Birth: Jul 10, 1940  Today's Date: 10/14/2019 PT Individual Time: 1345-1453 PT Individual Time Calculation (min): 68 min   Short Term Goals: Week 3:  PT Short Term Goal 1 (Week 3): =LTG due to ELOS  Skilled Therapeutic Interventions/Progress Updates:   Pt in w/c and agreeable to therapy, denies pain throughout session. Worked on sit<>stands to stedy, total assist +2 to boost into standing. Stood in stedy for ~60 sec w/ min assist and verbal/tactile cues for upright posture to work on [employer] through [employer]. Sit<>stand x2 in total. Spo2 >94% on 2 L/min after standing. Worked on BLE strengthening remainder of session. Attempted to use stationary foot bike, pt able to achieve reciprocal motion, however unable to maintain for any significant amount of time 2/2 BLE muscle spasms. Performed BLE strengthening exercises in seated as detailed below. Pt very slow w/ movements, however able to achieve strong muscle contraction w/ increased time. Passive R knee extension stretch for 20-30 sec at a time intermittently 2/2 R hamstring spasms. Pt remained on 2 L/min O2 throughout, spo2 >94% w/ mild increase in work of breathing. Also emphasized reciprocal pattern w/ exercises to mimic reciprocal movement pattern of gait. Pt is able to safely perform these exercises w/o assist (w/ exception of LAQ). Discussed performing them outside of therapy to help w/ relief of spasms and for global strengthening. Also performed BUE strengthening exercises including shoulder flexion x10 and bicep curls x10. Ended session in w/c, all needs in reach.   BLE strengthening: -knee marches 3x5 -heel slides 3x5 -LAQ 3x5 (R active-assist)  -heel raises 3x5   Therapy Documentation Precautions:  Precautions Precautions: Fall Restrictions Weight Bearing Restrictions: No Vital Signs: Therapy Vitals Pulse Rate: 80 Resp: 18 Patient Position (if  appropriate): Sitting Oxygen Therapy SpO2: 93 % O2 Device: Nasal Cannula(Found patient on [place], sats 93%.) O2 Flow Rate (L/min): 2 L/min   Therapy/Group: Individual Therapy  [name] Clent Demark 10/14/2019, 2:54 PM

## 2019-10-14 NOTE — Progress Notes (Signed)
Social Work Patient ID: [name], male   DOB: 11/09/1940, 79 y.o.   MRN: [id number]    SW met with pt in room to provide updates from team conference, and informed current d/c date on hold pending medical concerns (plans for an MRI). Pt aware SW to follow-up with his wife.   SW called pt wife [name] [phone]) to inform on above, and discuss pt recent changes in progress due to medical issues. She is aware there will be updates once a d/c date is established.   Loralee Pacas, MSW, [place] Office: [phone] Cell: [phone] Fax: [phone]

## 2019-10-14 NOTE — Progress Notes (Signed)
Physical Therapy Session Note  Patient Details  Name: [name] MRN: [id number] Date of Birth: 1941/04/25  Today's Date: 10/14/2019 PT Individual Time: 0900-1000 PT Individual Time Calculation (min): 60 min   Short Term Goals: Week 3:  PT Short Term Goal 1 (Week 3): =LTG due to ELOS  Skilled Therapeutic Interventions/Progress Updates:    Pt received seated in bed, agreeable to PT session. Pt on room air at rest, SpO2 found to be 77%. Assisted pt with donning O2 via nasal cannula at 3L O2. With 3L O2 SpO2 returns to 90% (+), remains in low 90's throughout session. Pt reports pain in L ankle at rest at site of wound, not rated and declines intervention. Pt also with increase in BLE spasms this date, unable to fully extend LE. Per RN and MD Baclofen being adjusted to address spasticity. Pt is dependent to don TED hose, rolling L/R with max A to don pants with assist x 2. Supine to sit with assist x 2 with HOB elevated. Pt is mod to max A for sitting balance EOB, leans to the R in sitting. Attempt sit to stand from elevated bed to stedy with assist x 2, pt unable to come to a full stand. With assist x 3 pt able to stand to stedy. Once seated on stedy seat pt unable to maintain upright trunk without max A. Stedy transfer to w/c. Once seated in w/c pt unable to maintain upright posture and pillow placed on R side due to assist with posture. Pt left seated in w/c in room with needs in reach, quick release belt and chair alarm in place at end of session.  Therapy Documentation Precautions:  Precautions Precautions: Fall Restrictions Weight Bearing Restrictions: No    Therapy/Group: Individual Therapy    Excell Seltzer, PT, DPT  10/14/2019, 12:19 PM

## 2019-10-14 NOTE — Progress Notes (Signed)
Occupational Therapy Session Note  Patient Details  Name: [name] MRN: [id number] Date of Birth: Dec 05, 1940  Today's Date: 10/14/2019 OT Individual Time: 1530-1555 OT Individual Time Calculation (min): 25 min    Short Term Goals: Week 3:  OT Short Term Goal 1 (Week 3): STG=LTG secondary to ELOS  Skilled Therapeutic Interventions/Progress Updates:    Upon entering the room, pt seated in wheelchair and asleep. RN reports transport coming to take pt to test soon. OT sets up transfer with use of STEDY but pt unable to stand with +2 assist. Pt's R LE also moving from foot board secondary to muscle spasms and spasticity. Pt having to return to wheelchair and Pt standing into stedy with +3 assistance. Pt standing again from elevated stedy seat with +3 and +2 needed for safety with sit >supine. +2 assistance needed for repositioning in bed as transport arrives to the room. OT switching oxygen to tank and disconnecting purwick per transport request. All needs within reach.   Therapy Documentation Precautions:  Precautions Precautions: Fall Restrictions Weight Bearing Restrictions: No General:   Vital Signs: Therapy Vitals Temp: 98.4 F (36.9 C) Temp Source: Oral Pulse Rate: 70 Resp: 20 BP: 126/63 Patient Position (if appropriate): Sitting Oxygen Therapy SpO2: 95 % O2 Device: Nasal Cannula Pain:   ADL: ADL Eating: Set up Where Assessed-Eating: Chair Grooming: Setup Where Assessed-Grooming: Sitting at sink Upper Body Bathing: Minimal assistance Where Assessed-Upper Body Bathing: Sitting at sink Lower Body Bathing: Maximal assistance Where Assessed-Lower Body Bathing: Bed level Upper Body Dressing: Minimal assistance Where Assessed-Upper Body Dressing: Sitting at sink Lower Body Dressing: Maximal assistance Where Assessed-Lower Body Dressing: Bed level Toileting: Maximal assistance Where Assessed-Toileting: Bed level ADL Comments: no urge to void during OT sessions,  condom cath in am, brief wet and changed bed level during pm session   Therapy/Group: Individual Therapy  Gypsy Decant 10/14/2019, 4:30 PM

## 2019-10-14 NOTE — Consult Note (Signed)
NAME:  [name], MRN:  [id number], DOB:  [birth year], LOS: 39 ADMISSION DATE:  09/26/2019, CONSULTATION DATE:  4/27 REFERRING MD:  Dagoberto Ligas, CHIEF COMPLAINT:  Exertional Hypoxia    Brief History   79 year old male admitted to rehab on 4/9 after acute on chronic nerve root impingement of the lumbar spine.  He has chronic left hemiparesis.  Also history of obstructive sleep apnea typically on CPAP with 2 L of oxygen bled in.  Recently treated for presumptive pneumonia from 4/21 through 4/27, pulmonary asked to evaluate on 4/27 as continues to have exertional hypoxia as low as 70% on 2 L with exertion.  History of present illness   79 year old male patient who was admitted to inpatient rehab  on 4/9 for what was ultimately found to have acute on chronic nerve root impingement of the lumbar spine.  He was felt not a surgical candidate and this was treated with systemic steroids.  His symptoms did improve some, however he did not regain ambulatory function, he was therefore transferred to inpatient rehab for ongoing rehabilitative services. On 4/21, patient noted to have fever as well as pulse oximetry down to 80%.  Temperature as high as 102, patient more lethargic, antibiotics initiated for possible pneumonia based on chest x-ray showing significant left-sided airspace disease with possible pleural effusion, he was started on IV vancomycin and cefepime, his white blood cell count has improved as has fever curve however in spite of therapy as of 4/27 the patient continues to demonstrate exertional hypoxia with activity with pulse oximetry dropping, Reportedly as low as 70% while on oxygen with exertion.  Because of this pulmonary was asked to evaluate.  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 rate 18, height 5\' 7"  (1.702 m), weight 97.9 kg, SpO2 93 %.        Intake/Output Summary (Last 24 hours) at 10/14/2019 1145 Last data filed at 10/14/2019 [id number] Gross per 24 hour  Intake 240 ml  Output 1000 ml  Net -760 ml   Filed Weights   10/11/19 0500 10/13/19 0703 10/14/19 0500  Weight: 96.2 kg 97.9 kg 97.9 kg    Examination: General: Pleasant 79 year old male patient currently sitting up in wheelchair he is in no acute distress currently HENT: Normocephalic atraumatic no jugular vein distention mucous membranes moist Lungs: Diminished more on left than right, some scattered rhonchi with cough Cardiovascular: Regular rate and rhythm Abdomen: Soft nontender Extremities: Dependent edema with scattered areas of ecchymosis Neuro: Awake oriented no focal deficits, does have upper extremity weakness  Resolved Hospital Problem list     Assessment & Plan:  Chronic hypoxic respiratory failure Chronically elevated left hemidiaphragm Atelectasis of the left lung Poor mucociliary clearance/poor cough mechanics Recent HCAP Obstructive sleep apnea Chronic left-sided weakness Acute on chronic nerve root impingement of the lumbar spine  Discussion This is a 79 year old male patient with what appears to be chronic hypoxic respiratory failure,  acutely exacerbated by recent pneumonia versus atelectasis with mucous plugging all superimposed on underlying chronic left hemidiaphragm elevation/paralysis.  Suspect this is related prior cervical spine injury.  We did evaluate the pleural space via ultrasound there was  minimal pleural fluid, mostly atelectasis with elevated hemidiaphragm noted.  No role for thoracentesis.  Plan/recommendation Keep him in the upright position is much as possible Continue CPAP at at bedtime with supplemental oxygen Titrate oxygen for pulse oximetry greater than 88%, he may need more oxygen with exertion Add spirometry and flutter valve to assist with pulmonary hygiene Agree with stopping antibiotics Okay from pulmonary standpoint to proceed with MRI imaging with general anesthesia as previously planned We will place follow-up in computer with the pulmonary office     Labs   CBC: Recent Labs  Lab 10/08/19 1455 10/09/19 0721 10/10/19 0545 10/13/19 0431  WBC 18.6* 15.3* 10.3 6.3  NEUTROABS  --  12.5* 7.8* 3.8  HGB 12.2* 11.8* 11.2* 11.6*  HCT 37.7* 36.3* 34.2* 36.4*  MCV 90.6 90.1 91.0 93.8  PLT 171 159 153 [id number]    Basic Metabolic Panel: Recent Labs  Lab 10/08/19 1455 10/09/19 0721 10/10/19 0545 10/13/19 0431  NA 138 137 140 142  K 3.9 3.8 3.6 3.7  CL 99 99 101 103  CO2 27 27 28 31   GLUCOSE 139* 99 97 95  BUN 16 15 15 15   CREATININE 0.91 0.87 0.81 0.86  CALCIUM 8.3* 8.7* 8.6* 8.5*   GFR: Estimated Creatinine Clearance: 78.9 mL/min (by C-G formula based on SCr of 0.86 mg/dL). Recent Labs  Lab 10/08/19 1455 10/09/19 0721 10/10/19 0545 10/13/19 0431  WBC 18.6* 15.3* 10.3 6.3    Liver Function Tests: Recent Labs  Lab 10/08/19 1455  AST 14*  ALT 7  ALKPHOS 57  BILITOT 0.8  PROT 5.8*  ALBUMIN 2.7*   No results for input(s): LIPASE, AMYLASE in the last 168 hours. No results for input(s): AMMONIA in the last 168 hours.  ABG    Component Value Date/Time   PHART 7.343 (L) 01/19/2019 0852   PCO2ART 70.4 (HH) 01/19/2019 0852   PO2ART 93.3 01/19/2019 0852   HCO3 37.2 (H) 01/19/2019 0852   O2SAT 96.8 01/19/2019 0852     Coagulation Profile: No results for input(s): INR, PROTIME in the last 168 hours.  Cardiac Enzymes: No results for  input(s): CKTOTAL, CKMB, CKMBINDEX, TROPONINI in the last 168 hours.  HbA1C: Hgb A1c MFr Bld  Date/Time Value Ref Range Status  09/17/2019 08:29 PM 5.8 (H) 4.8 - 5.6 % Final    Comment:    (NOTE) Pre diabetes:          5.7%-6.4% Diabetes:              >6.4% Glycemic control for   <7.0% adults with diabetes   09/03/2017 12:09 PM 5.6 4.8 - 5.6 % Final    Comment:    (NOTE) Pre diabetes:          5.7%-6.4% Diabetes:              >6.4% Glycemic control for   <7.0% adults with diabetes     CBG: No results for input(s): GLUCAP in the last 168 hours.  Review of Systems:   Review of Systems  Constitutional: Negative.   HENT: Negative.   Eyes: Negative.   Respiratory: Positive for cough and shortness of breath. Negative for sputum production.   Cardiovascular: Positive for leg swelling. Negative for chest pain.  Gastrointestinal: Negative.  Genitourinary: Negative.   Musculoskeletal: Negative.   Skin: Negative.   Neurological: Negative.   Endo/Heme/Allergies: Negative.   Psychiatric/Behavioral: Negative.      Past Medical History  He,  has a past medical history of Arthritis, Barrett esophagus, Bronchitis, Carotid artery occlusion, COPD (chronic obstructive pulmonary disease) ([place]), Diabetes mellitus without complication ([place]), GERD (gastroesophageal reflux disease), Headache, History of thyroid cancer (2008), Hypertension, Restless leg, Sleep apnea with use of continuous positive airway pressure (CPAP), Thoracic ascending aortic aneurysm ([place]), thyroid ca (dx'd 2009 or 2010), and Ulcer.   Surgical History    Past Surgical History:  Procedure Laterality Date  . ANTERIOR CERVICAL DECOMP/DISCECTOMY FUSION N/A 09/03/2017   Procedure: ACDF - C3-C4 - C4-C5 - C5-C6;  Surgeon: Kary Kos, MD;  Location: [facility];  Service: Neurosurgery;  Laterality: N/A;  . CAROTID ENDARTERECTOMY Left January 03, 2006   Dr. Amedeo Plenty  . COLONOSCOPY    . EYE SURGERY Bilateral    cataract surgery with  lens implants  . game keepers thumb Right   . JOINT REPLACEMENT Left    knee X 2  . ROTATOR CUFF REPAIR Right   . THYROIDECTOMY  2008     Social History   reports that he quit smoking about 14 years ago. His smoking use included cigars and cigarettes. He quit after 40.00 years of use. He has never used smokeless tobacco. He reports that he does not drink alcohol or use drugs.   Family History   His family history includes COPD in his mother; Cancer in his father; Diabetes in his father; Hyperlipidemia in his father and mother; Hypertension in his father and mother; Kidney disease in his father.   Allergies No Known Allergies   Home Medications  Prior to Admission medications   Medication Sig Start Date End Date Taking? Authorizing Provider  acetaminophen (TYLENOL) 325 MG tablet Take 2 tablets (650 mg total) by mouth every 4 (four) hours as needed for mild pain ((score 1 to 3) or temp > 100.5). 09/06/17   Kary Kos, MD  allopurinol (ZYLOPRIM) 300 MG tablet Take 300 mg by mouth daily.  06/02/15   [provider]  baclofen (LIORESAL) 10 MG tablet TAKE 1 TABLET BY MOUTH 3 TIMES DAILY. Patient taking differently: Take 10 mg by mouth 3 (three) times daily.  06/23/19   Ward Givens, NP  calcium citrate-vitamin D (CITRACAL+D) 315-200 MG-UNIT per tablet Take 1 tablet by mouth daily. 750 mg     [provider]  carbidopa-levodopa (SINEMET IR) 10-100 MG tablet Take 1 tablet by mouth 2 (two) times daily.     [provider]  cholecalciferol (VITAMIN D) 1000 units tablet Take 1,000 Units by mouth daily.     [provider]  colchicine 0.6 MG tablet Take 0.6 mg by mouth daily.    [provider]  furosemide (LASIX) 40 MG tablet Take 1 tablet (40 mg total) by mouth daily. 04/18/19 09/18/19  Minus Breeding, MD  gabapentin (NEURONTIN) 100 MG capsule Take 1 capsule (100 mg total) by mouth at bedtime. 09/16/19   Ward Givens, NP  guaifenesin (HUMIBID E) 400  MG TABS tablet Take 400 mg by mouth daily.     [provider]  ipratropium-albuterol (DUONEB) 0.5-2.5 (3) MG/3ML SOLN Take 3 mLs by nebulization every 6 (six) hours as needed (cough).    [provider]  levETIRAcetam (KEPPRA) 250 MG tablet Take 250 mg by mouth 2 (two) times daily. 08/15/19   [provider]  levothyroxine (SYNTHROID) 175 MCG tablet Take 125 mcg by mouth daily before breakfast.     [provider]  metFORMIN (GLUCOPHAGE-XR) 500 MG 24 hr tablet Take 500 mg by mouth 2 (two) times daily.  01/07/19   [provider]  metoprolol succinate (TOPROL-XL) 50 MG 24 hr tablet Take 1 tablet (50 mg total) by mouth daily. Take with or immediately following a meal. 02/17/19 09/18/19  Minus Breeding, MD  Multiple Vitamin (MULTIVITAMIN) tablet Take 1 tablet by mouth daily.    [provider]  nitroGLYCERIN (NITROSTAT) 0.4 MG SL tablet Place 0.4 mg every 5 (five) minutes as needed under the tongue for chest pain.    [provider]  oxymetazoline (AFRIN) 0.05 % nasal spray Place 2 sprays into the nose 2 (two) times daily as needed for congestion.     [provider]  pantoprazole (PROTONIX) 40 MG tablet Take 40 mg by mouth every evening.    [provider]  polyethylene glycol powder (MIRALAX) 17 GM/SCOOP powder Take by mouth as directed. 17 gm every other day     [provider]  rizatriptan (MAXALT) 10 MG tablet Take 10 mg by mouth as needed for migraine.  08/08/18   [provider]  simvastatin (ZOCOR) 40 MG tablet Take 40 mg by mouth daily at 6 PM.  08/07/17   [provider]  spironolactone (ALDACTONE) 25 MG tablet Take 25 mg by mouth daily.  02/07/19   [provider]  TRAVATAN Z 0.004 % SOLN ophthalmic solution Place 1 drop into both eyes at bedtime.  07/25/13   [provider]  TRELEGY ELLIPTA 100-62.5-25 MCG/INH AEPB Inhale 1 puff into the lungs daily.  05/01/18   [provider]  triamcinolone cream (KENALOG) 0.5 % Apply 1 application topically daily as needed for rash. 09/09/19   [provider]  VENTOLIN HFA 108 (90 Base) MCG/ACT inhaler Inhale 1-2 puffs into the lungs every 4 (four) hours as needed for wheezing or shortness of breath.  07/17/18   [provider]     Critical care time: NA      Erick Colace ACNP-BC [place] Pager # [phone] OR # [phone] if no answer

## 2019-10-14 NOTE — Patient Care Conference (Signed)
Inpatient RehabilitationTeam Conference and Plan of Care Update Date: 10/14/2019   Time: 11:15 AM    Patient Name: [name]      Medical Record Number: [id number]  Date of Birth: [birth year] Sex: Male         Room/Bed: 4M07C/4M07C-01 Payor Info: Payor: [employer] / Plan: [employer] MEDICARE / Product Type: *No Product type* /    Admit Date/Time:  09/26/2019  1:13 PM  Primary Diagnosis:  Neurogenic claudication due to lumbar spinal stenosis  Patient Active Problem List   Diagnosis Date Noted  . Cervical myelopathy ([place]) 09/26/2019  . Diabetes mellitus type 2, controlled, with complications ([place]) [id number]  . Obese 09/17/2019  . Thoracic aortic aneurysm ([place]) 09/17/2019  . Bowel incontinence 09/17/2019  . Bladder incontinence 09/17/2019  . Right hemiparesis ([place]) 09/17/2019  . Acute renal failure (ARF) ([place]) 09/17/2019  . Left hemiparesis ([place]) 09/17/2019  . Chronic respiratory failure with hypoxia ([place]) 09/17/2019  . Syncope and collapse 09/17/2019  . Chronic diastolic CHF (congestive heart failure) ([place]) 09/04/2019  . PVD (peripheral vascular disease) ([place]) 02/14/2019  . Essential hypertension 02/13/2019  . Non-insulin treated type 2 diabetes mellitus ([place]) 02/13/2019  . Severe sepsis ([place]) 01/17/2019  . Community acquired pneumonia of right lower lobe of lung 01/17/2019  . Postural urinary incontinence 05/20/2018  . Myelopathy concurrent with and due to spinal stenosis of cervical region ([place]) 05/20/2018  . Myoclonic jerking 05/20/2018  . 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 History of respiratory failure 08/18/2017  . Hypokalemia 08/18/2017  . Leukocytosis 08/18/2017  . Muscle atrophy of lower extremity 08/09/2017  . Bowel and bladder incontinence 08/09/2017  . Right-sided muscle weakness 08/09/2017  . Neuropathy 08/09/2017  . 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/2017  . Abnormality of gait  07/12/2017  . Myoclonic jerkings, 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/2017  . Acquired right foot drop 07/12/2017  . UTI (urinary tract infection) due to Enterococcus 07/12/2017  . Pressure injury of skin 06/21/2017  . Acute cystitis 06/20/2017  . Generalized weakness 06/20/2017  . Dehydration 06/20/2017  . Dyspnea 12/26/2016  . Chronic cough 12/26/2016  . Hypersomnia with sleep apnea 10/30/2016  . CSA (central sleep apnea) 10/30/2016  . Wheezing symptom 10/30/2016  . 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 RLS (restless legs syndrome) 08/25/2014  . Primary gout 08/25/2014  . OSA on CPAP 08/25/2014  . Severe obesity (BMI >= 40) ([place]) 08/25/2014  . Obesity (BMI 30-39.9) 02/18/2013  . 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 S/P TKR (total knee replacement) 11/29/2012  . History of thyroid cancer     Expected Discharge Date: Expected Discharge Date: (Hold DC)  Team Members Present: Physician leading conference: Dr. Courtney Heys Care Coodinator Present: Loralee Pacas, LCSWA;Christina Sampson Goon, BSW;Genie Jami Ohlin, RN, MSN Nurse Present: Debroah Loop, RN PT Present: Excell Seltzer, PT OT Present: Roanna Epley, [place], OT PPS Coordinator present : Ileana Ladd, Burna Mortimer, SLP     Current Status/Progress Goal Weekly Team Focus  Bowel/Bladder   pt has a male(purewick ) pt sometimes knows when he is having a bowel movement , LMB 4/25  to help pt regain bladder and bowel control  assess bowel and bladder qshift and prn   Swallow/Nutrition/ Hydration             ADL's   MAX- total A for dressing; dependent for toileting using stedy; +2 sit<>stand with stedy; MAX A bed mobility;  MIN- MOD A sitting balance  supervision overall, min A LB dressing( may need to downgrade?)  activity tolerance, SB transfers, sit<>stand, sitting balance, BADL retraining, family education   Mobility   max A to +2 bed mobility, +2 to stand to  stedy, Supervision w/c mobility x 25-50 ft  min A overall  transfers, endurance   Communication             Safety/Cognition/ Behavioral Observations            Pain   pt has a c/o of headache, ankle pain, and msucle spasm pain, narco , balcofen, gabapetin  to keep pain < 3  assess pain qshift and prn   Skin   wound to left ankle, abraisions over pinky, and left arm, MASD to buttocks  to prevent further breakdown and promote healing  assess skin qshift and prn    Rehab Goals Patient on target to meet rehab goals: Yes *See Care Plan and progress notes for long and short-term goals.     Barriers to Discharge  Current Status/Progress Possible Resolutions Date Resolved   Nursing                  PT  Medical stability                 OT                  SLP                SW Decreased caregiver support;Lack of/limited family support wife is the primary caregiver            Discharge Planning/Teaching Needs:  Pt to d/c to home with his wife who will provide 24/7 care; aide service- 3hrs in the AM/PM  Family education as recommended by therapy   Team Discussion: MD abx complete today for PNA, spasticity worse, on baclofen, MRI ordered.  RN wife is here, BM 4/26, condom cath, wound L malleolus.  PT O2 sats 70's, placed on 3L O2, doing worse, +3 to steady, weak, spasms, can't straighten legs.  OT max/tot dressing, tot toilet, steady +2, mod/max bed, S goals, LB Dressing goals min A.   Revisions to Treatment Plan: DC placed on hold.     Medical Summary Current Status: trying to get ahold of [facility] for MRI; baclofen calmed down spasms some; wife is here; LBM 4/26; external catheter- last day IV ABX- put on 3L O2 Weekly Focus/Goal: doing worse than when started- 3 to stand to steady this AM- couldn't get legs straight-  Barriers to Discharge: Behavior;Decreased family/caregiver support;Home enviroment access/layout;Neurogenic Bowel & Bladder;Weight;Medical stability;Weight bearing  restrictions   Possible Resolutions to Barriers: OT- doing worse than inital-max-total A for dressing; Total  A for toileting; bed now mod-max asisst- was supervision.   Continued Need for Acute Rehabilitation Level of Care: The patient requires daily medical management by a physician with specialized training in physical medicine and rehabilitation for the following reasons: Direction of a multidisciplinary physical rehabilitation program to maximize functional independence : Yes Medical management of patient stability for increased activity during participation in an intensive rehabilitation regime.: Yes Analysis of laboratory values and/or radiology reports with any subsequent need for medication adjustment and/or medical intervention. : Yes   I attest that I was present, lead the team conference, and concur with the assessment and plan of the team.   Jodell Cipro M 10/14/2019, 2:04 PM   Team conference was held via  web/ teleconference due to [place] - 19

## 2019-10-14 NOTE — Plan of Care (Signed)
  Problem: SCI BLADDER ELIMINATION Goal: RH STG MANAGE BLADDER WITH ASSISTANCE Description: STG Manage Bladder With min/mod Assistance Outcome: Progressing   Problem: RH SKIN INTEGRITY Goal: RH STG SKIN FREE OF INFECTION/BREAKDOWN Description: Patients skin will remain free from further infection or breakdown with mod assist. Outcome: Progressing Goal: RH STG MAINTAIN SKIN INTEGRITY WITH ASSISTANCE Description: STG Maintain Skin Integrity With mod Assistance. Outcome: Progressing Goal: RH STG ABLE TO PERFORM INCISION/WOUND CARE W/ASSISTANCE Description: STG Able To Perform Incision/Wound Care With total Assistance from caregiver. Outcome: Progressing   Problem: RH PAIN MANAGEMENT Goal: RH STG PAIN MANAGED AT OR BELOW PT'S PAIN GOAL Description: < 4 Outcome: Progressing

## 2019-10-14 NOTE — Discharge Instructions (Signed)
Inpatient Rehab Discharge Instructions  [name] Discharge date and time: No discharge date for patient encounter.   Activities/Precautions/ Functional Status: Activity: activity as tolerated Diet: regular diet Wound Care: none needed Functional status:  ___ No restrictions     ___ Walk up steps independently ___ 24/7 supervision/assistance   ___ Walk up steps with assistance ___ Intermittent supervision/assistance  ___ Bathe/dress independently ___ Walk with walker     _x__ Bathe/dress with assistance ___ Walk Independently    ___ Shower independently ___ Walk with assistance    ___ Shower with assistance ___ No alcohol     ___ Return to work/school ________  Special Instructions: No driving smoking or alcohol  Continue oxygen as needed   My questions have been answered and I understand these instructions. I will adhere to these goals and the provided educational materials after my discharge from the hospital.  Patient/Caregiver Signature _______________________________ Date __________  Clinician Signature _______________________________________ Date __________  Please bring this form and your medication list with you to all your follow-up doctor's appointments.

## 2019-10-14 NOTE — Progress Notes (Signed)
Occupational Therapy Session Note  Patient Details  Name: [name] MRN: [id number] Date of Birth: [birth year]  Today's Date: 10/14/2019 OT Individual Time: 1130-1200 OT Individual Time Calculation (min): 30 min    Short Term Goals: Week 3:  OT Short Term Goal 1 (Week 3): STG=LTG secondary to ELOS  Skilled Therapeutic Interventions/Progress Updates:    Pt resting in w/c upon arrival with wife present.  Pt noted with significant lean to R and using a pillow to provide support.  Pt and wife both stated that when pt is fatigued and/or weak he leans to his left.  Pt unable to maintain upright sitting posture without support of pillow.  OT intervention with focus on sitting balance/posture, core strengthening, and trunk rotation.  Pt challenged with using core musculature to lean forward without BUE support.  Pt required min A to initiate task but able to follow through.  Pt performed task X 4. Pt also engaged in trunk rotation activities with limited success.  Pt's general weakness and body habitus inhibiting factors. O2 sats >91% on 3L O2. Pt remained in w/c with all needs within reach and belt alarm activated.   Therapy Documentation Precautions:  Precautions Precautions: Fall Restrictions Weight Bearing Restrictions: No Pain:   Pt denies pain this morning   Therapy/Group: Individual Therapy  [name] 10/14/2019, 12:08 PM

## 2019-10-14 NOTE — Progress Notes (Signed)
[facility] PHYSICAL MEDICINE & REHABILITATION PROGRESS NOTE    Subjective:  Wants to increase baclofen-was "twitching since 10pm" last night-  took q8 hours at home- wants it more frequently, since spasms are so painful, so bad esp at night.   Asking for baclofen QID, but will change baclofen to TID with meals AND 20 mg QHS.   Says still hasn't gotten MRI- spoke to nursing- off O2- they are trying to see why pt hasn't gotten MRI yet.   ROS:  Pt denies SOB, abd pain, CP, N/V/C/D, and vision changes   Objective:   No results found. Recent Labs    10/13/19 0431  WBC 6.3  HGB 11.6*  HCT 36.4*  PLT 264   Recent Labs    10/13/19 0431  NA 142  K 3.7  CL 103  CO2 31  GLUCOSE 95  BUN 15  CREATININE 0.86  CALCIUM 8.5*    Intake/Output Summary (Last 24 hours) at 10/14/2019 0944 Last data filed at 10/14/2019 [id number] Gross per 24 hour  Intake 240 ml  Output 1000 ml  Net -760 ml     Physical Exam: Vital Signs Blood pressure 127/70, pulse 84, temperature 97.9 F (36.6 C), resp. rate 16, height 5\' 7"  (1.702 m), weight 97.9 kg, SpO2 90 %.   General: awake, sitting up in bed; appropriate, NAD; Off O2 Frustrated about spasms this AM Heart: RRR Lungs: a little coarse, wheezing- this is CHRONIC for pt- before nebs-good air movement  Abdomen: Soft, NT, ND, (+)BS  Extremities: No clubbing, cyanosis, or edema Skin: No evidence of breakdown, no evidence of rash  Musculoskeletal: Full range of motion in all 4 extremities. No joint swelling Neurologic: Cranial nerves II through XII intact, motor strength is 4/5 in bilateral deltoid, bicep, tricep, grip,2- bilateral  hip flexor, knee extensors, ankle dorsiflexor and plantar flexor Sensory exam normal sensation to light touch and proprioception in bilateral upper and lower extremities  Musculoskeletal: Full range of motion in all 4 extremities. No joint swelling Hamstring tone seen vs contracture    Assessment/Plan: 1.  Functional deficits secondary to lumbar spinal stenosis with neurogenic claudication which require 3+ hours per day of interdisciplinary therapy in a comprehensive inpatient rehab setting.  Physiatrist is providing close team supervision and 24 hour management of active medical problems listed below.  Physiatrist and rehab team continue to assess barriers to discharge/monitor patient progress toward functional and medical goals  Care Tool:  Bathing    Body parts bathed by patient: Right arm, Left arm, Chest, Abdomen, Front perineal area, Right upper leg, Left upper leg, Right lower leg, Left lower leg, Face   Body parts bathed by helper: Buttocks     Bathing assist Assist Level: Minimal Assistance - Patient > 75%     Upper Body Dressing/Undressing Upper body dressing   What is the patient wearing?: Pull over shirt    Upper body assist Assist Level: Maximal Assistance - Patient 25 - 49%    Lower Body Dressing/Undressing Lower body dressing      What is the patient wearing?: Pants, Underwear/pull up     Lower body assist Assist for lower body dressing: Total Assistance - Patient < 25%     Toileting Toileting    Toileting assist Assist for toileting: Dependent - Patient 0%     Transfers Chair/bed transfer  Transfers assist     Chair/bed transfer assist level: Dependent - mechanical lift(stedy +3)     Locomotion Ambulation   Ambulation assist  Ambulation activity did not occur: Safety/medical concerns  Assist level: 2 helpers Assistive device: Parallel bars Max distance: 5'   Walk 10 feet activity   Assist  Walk 10 feet activity did not occur: Safety/medical concerns        Walk 50 feet activity   Assist Walk 50 feet with 2 turns activity did not occur: Safety/medical concerns         Walk 150 feet activity   Assist Walk 150 feet activity did not occur: Safety/medical concerns         Walk 10 feet on uneven surface   activity   Assist Walk 10 feet on uneven surfaces activity did not occur: Safety/medical concerns         Wheelchair     Assist Will patient use wheelchair at discharge?: Yes Type of Wheelchair: Manual    Wheelchair assist level: Supervision/Verbal cueing Max wheelchair distance: 25'    Wheelchair 50 feet with 2 turns activity    Assist        Assist Level: Supervision/Verbal cueing   Wheelchair 150 feet activity     Assist      Assist Level: Supervision/Verbal cueing   Blood pressure 127/70, pulse 84, temperature 97.9 F (36.6 C), resp. rate 16, height 5\' 7"  (1.702 m), weight 97.9 kg, SpO2 90 %.    Medical Problem List and Plan: 1.Decreased functional ability due to lumbar stenosis/radiculopathy with neurogenic claudication. MRI's also suggest (albeit with motion artifact) distal thoracic involvement as well.  Exam is more c/w this too.              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 Antithrombotics: -DVT/anticoagulation:Lovenox.   4/12- Dopplers (-)- con't lovenox -antiplatelet therapy: Aspirin 81 mg daily 3. 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  4/26- MRI hopefully today  4/27- will change baclofen to 15 mg TID- with  meals and 20 mg QHS 4. Mood:Provide emotional support -antipsychotic agents: N/A 5. Neuropsych: This patientiscapable of making decisions on hisown behalf. 6. Skin/Wound Care:place steristrips over right index finger lac.                          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    4/15- ordered new PRAFOs to keep feet from turning out/rotating out  4/16- PRAFOs came with kickstands                         -foam dressing left knee 7. Fluids/Electrolytes/Nutrition:Routine in  and outs with follow-up chemistries. Diet changed to regular as per patient's preference. He states he would not follow current diet at home as is unappealing to him. I discussed that our goal is to simulate home environment as medications are adjusted according to current labs/vitals. I discussed that goal of heart healthy diet is to decrease sodium and saturated fat to protect the heart and circulatory system. Discussed his current diet and advised that he cut back on sugary juices, which he likes. Recommended replacing with water. He asked about his [employer] which he and his wife drink a lot of at home. Advised that despite it having no calories, it has a lot of processed ingredients and his goal should be to minimize processed foods. He expressed understanding. Continue nutritional counseling.   4/19: Electrolytes stable on BMP today. Calcium low at 8.7. Will start calcium supplement.  8. Chronic restless leg syndrome. Sinemet 10-100 mg twice daily. 9. 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 OSA with CPAP/2 L chronic oxygen at home. Continue inhalers  4/23- checked with resp therapy and verified pt hasn't been  receiving CPAP at night most nights- she will "work on it".  11. 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 Follow-up outpatient 12. Diabetes mellitus. Hemoglobin A1c 5.8. SSI. Changed to [facility] HS for CBG and SSI  4/17- pt refused to get BGs checked- stopped 4/12- A1c 5.8- 13. Neurogenic bowel and bladder. Check PVR  Last BM 4/24  MIralax and sorbitol, incont   4/27- Bowel incontinent- LBM 4pm yesterday afternoon and smear overnight.  14. Hypothyroidism. Synthroid 15. Chronic diastolic congestive heart failure. Monitor for any signs of fluid overload. Check regular weights   Filed Weights   10/11/19 0500 10/13/19 0703 10/14/19 0500  Weight: 96.2 kg 97.9 kg 97.9 kg   Weight in range from 95-97 kg- no SOB or increased edema   4/27- Weights are stable- con't regimen 16. Hyperlipidemia. Zocor 17. Code status: Changed to DNR as per patient's wishes. 18. Leukocytosis - pneumonia-   4/12- on prednisone taper- the cause since afebrile and no Sx's of illness-con't steroid taper  4/20- WBC back up to 14.7- off prednisone-Tm 99.9 yesterday- will check U/A, U Cx,  and CXR  4/21-  U/A somewhat (+) and CXR also shows likely pneumonia- and (+) UTI as well- large leuk- negative nitrites- will start Vanc and Cefepime per pharmacy-  And ordered labs in AM  4/22- WBC went up to 18.6k- back down to 15.3 k- fever broke yesterday- feeling a little better- will check labs in AM for trend and then not over weekend, unless needed  4/23- WBC 10. 3k- better- con't Vanc/Cefepime, MRSA + UCx contaminants, BC x1 neg, day 4 abx  4/26- Still on O2 but sounds better- con't ABX  4/27- off O2- off ABX as of today.  19. Wheezing  Resolved on abx, scheduled umeclidinium    20 Spasticity vs tone in hamstrings, chronically WC bound may be contracted    LOS: 18 days A FACE TO FACE EVALUATION WAS PERFORMED  Dallana Mavity 10/14/2019, 9:44 AM

## 2019-10-15 ENCOUNTER — Inpatient Hospital Stay (HOSPITAL_COMMUNITY): Payer: Medicare Other | Admitting: *Deleted

## 2019-10-15 ENCOUNTER — Inpatient Hospital Stay (HOSPITAL_COMMUNITY): Payer: Medicare Other | Admitting: Physical Therapy

## 2019-10-15 ENCOUNTER — Inpatient Hospital Stay (HOSPITAL_COMMUNITY): Payer: Medicare Other

## 2019-10-15 ENCOUNTER — Ambulatory Visit: Payer: Medicare Other | Admitting: Physical Therapy

## 2019-10-15 MED ORDER — CHLORHEXIDINE GLUCONATE CLOTH 2 % EX PADS
6.0000 | MEDICATED_PAD | Freq: Every day | CUTANEOUS | Status: DC
  Administered 2019-10-15 – 2019-10-28 (×14): 6 via TOPICAL

## 2019-10-15 MED ORDER — TAMSULOSIN HCL 0.4 MG PO CAPS
0.4000 mg | ORAL_CAPSULE | Freq: Every day | ORAL | Status: DC
  Administered 2019-10-15 – 2019-10-20 (×5): 0.4 mg via ORAL
  Filled 2019-10-15 (×6): qty 1

## 2019-10-15 NOTE — Progress Notes (Signed)
Occupational Therapy Session Note  Patient Details  Name: [name] MRN: [id number] Date of Birth: [birth year]  Today's Date: 10/15/2019 OT Individual Time: 0215-0300 OT Individual Time Calculation (min): 45 min  and Today's Date: 10/15/2019 OT Missed Time: 15 Minutes Missed Time Reason: Other (comment)(pt request to finish lunch)   Short Term Goals: Week 2:  OT Short Term Goal 1 (Week 2): STG=LTG secondary to ELOS  Skilled Therapeutic Interventions/Progress Updates:  Pt received seated in w/c finishing up lunch; missed 15 mins of skilled intervention to allow pt to finish lunch. Returned 15 mins later with pt agreeable to session. Pt on 2L O2 during session with O2 >95% during session. Pt complete household distance w/c propulsion during session from room<>gym with supervision. Session focus on increasing activity tolerance and maintain cardiorespiratory status during therapeutic activities. Pt completed dynavision activity seated in w/c ~ 3 mins with increased effort and time to reach all lights in bottom 2 quadrants. Pt required verbal cues to incorporate PLB into functional reach activity. Pt completed seated kicks, marches and ball tossed from w/c needing rest breaks after completing ~ 4 mins of each activity. Pt complete seated trunk rotation with ball and shoulder flexion/extension x10 reps. Pt left up in w/c with alarm belt activated and all needs within reach.   Therapy Documentation Precautions:  Precautions Precautions: Fall Restrictions Weight Bearing Restrictions: No General: General OT Amount of Missed Time: 15 Minutes PT Missed Treatment Reason: Nursing care;Unavailable (Comment)(foley care with nursing, then pt eating breakfast) Vital Signs: Therapy Vitals Temp: 98.2 F (36.8 C) Pulse Rate: 77 Resp: 20 BP: (!) 117/59 Patient Position (if appropriate): Sitting Oxygen Therapy SpO2: 95 % O2 Device: Nasal Cannula O2 Flow Rate (L/min): 2 L/min Pain: Pt reports pain  in RLE during spasms; offered increased rest breaks and repositioning as pain mgmt strategy.   Therapy/Group: Individual Therapy  [name] 10/15/2019, 3:46 PM

## 2019-10-15 NOTE — Plan of Care (Signed)
  Problem: Consults Goal: RH SPINAL CORD INJURY PATIENT EDUCATION Description:  See Patient Education module for education specifics.  Outcome: Progressing Goal: Skin Care Protocol Initiated - if Braden Score 18 or less Description: If consults are not indicated, leave blank or document N/A Outcome: Progressing   Problem: SCI BOWEL ELIMINATION Goal: RH STG MANAGE BOWEL WITH ASSISTANCE Description: STG Manage Bowel with min Assistance. Outcome: Progressing Goal: RH STG SCI MANAGE BOWEL PROGRAM W/ASSIST OR AS APPROPRIATE Description: STG SCI Manage bowel program w/ min assist or as appropriate. Outcome: Progressing   Problem: SCI BLADDER ELIMINATION Goal: RH STG MANAGE BLADDER WITH ASSISTANCE Description: STG Manage Bladder With min/mod Assistance Outcome: Progressing   Problem: RH SKIN INTEGRITY Goal: RH STG SKIN FREE OF INFECTION/BREAKDOWN Description: Patients skin will remain free from further infection or breakdown with mod assist. Outcome: Progressing Goal: RH STG MAINTAIN SKIN INTEGRITY WITH ASSISTANCE Description: STG Maintain Skin Integrity With mod Assistance. Outcome: Progressing Goal: RH STG ABLE TO PERFORM INCISION/WOUND CARE W/ASSISTANCE Description: STG Able To Perform Incision/Wound Care With total Assistance from caregiver. Outcome: Progressing   Problem: RH SAFETY Goal: RH STG ADHERE TO SAFETY PRECAUTIONS W/ASSISTANCE/DEVICE Description: STG Adhere to Safety Precautions With mod I Assistance/Device. Outcome: Progressing   Problem: RH PAIN MANAGEMENT Goal: RH STG PAIN MANAGED AT OR BELOW PT'S PAIN GOAL Description: < 4 Outcome: Progressing

## 2019-10-15 NOTE — Progress Notes (Signed)
Patient placed on PPV overnight. 

## 2019-10-15 NOTE — Progress Notes (Signed)
Physical Therapy Session Note  Patient Details  Name: ABDELAZIZ BURTS MRN: [id number] Date of Birth: 1941/06/14  Today's Date: 10/15/2019 PT Individual Time: 1300-1330 PT Individual Time Calculation (min): 30 min   Short Term Goals: Week 3:  PT Short Term Goal 1 (Week 3): =LTG due to ELOS  Skilled Therapeutic Interventions/Progress Updates:   Pt received in w/c and agreeable to therapy, no c/o pain. Pt self-propelled w/c towards therapy gym w/ BUEs and supervision to work on endurance and BUE strengthening. Self-propelled 25-40' x3 reps, verbal cues for technique w/ turns. Total assist remainder of way 2/2 fatigue. Worked on BLE strengthening and endurance on kinetron, 1 min x5 reps @ 90 cm/sec resistance. Returned to room total assist and ended session in w/c, all needs in reach.   Therapy Documentation Precautions:  Precautions Precautions: Fall Restrictions Weight Bearing Restrictions: No Vital Signs: Therapy Vitals Temp: 98.2 F (36.8 C) Pulse Rate: 77 Resp: 20 BP: (!) 117/59 Patient Position (if appropriate): Sitting Oxygen Therapy SpO2: 95 % O2 Device: Nasal Cannula O2 Flow Rate (L/min): 2 L/min  Therapy/Group: Individual Therapy  Zianne Schubring Clent Demark 10/15/2019, 1:53 PM

## 2019-10-15 NOTE — Progress Notes (Signed)
Physical Therapy Session Note  Patient Details  Name: [name] MRN: [id number] Date of Birth: 01-29-1941  Today's Date: 10/15/2019 PT Missed Time: 60 min Missed Time Reason: nursing care (foley care) and then unavailable (eating breakfast)  Short Term Goals: Week 3:  PT Short Term Goal 1 (Week 3): =LTG due to ELOS  Skilled Therapeutic Interventions/Progress Updates:    Pt received seated in bed, unavailable for first 20 min of session as nursing is inserting a foley catheter. Once nursing done with foley care attempted to see patient for scheduled therapy session. Pt had just been set up with his breakfast and needed time to eat. Pt found to be on room air, SpO2 77%. Put pt on 2L O2 and SpO2 comes up to 91% with time and pursed lip breathing. Therapist returns after 15 min to attempt to see patient, he is still eating breakfast and needs more time to finish eating. Pt left seated in bed in order to finish breakfast. Pt missed 60 min total of scheduled therapy session due to nursing care and needing time to eat breakfast.  Therapy Documentation Precautions:  Precautions Precautions: Fall Restrictions Weight Bearing Restrictions: No    Therapy/Group: Individual Therapy   Excell Seltzer, PT, DPT  10/15/2019, 11:57 AM

## 2019-10-15 NOTE — Progress Notes (Signed)
[facility] PHYSICAL MEDICINE & REHABILITATION PROGRESS NOTE    Subjective:  Pt reports O2 during day variable- was on O2 at home at night.   Per PA, checked to see if pt retaining urine- was- 500cc last night- needed in/out cath because of it.   Pt doesn't like in/out caths- we discussed using Flomax to get pt to void- explained effective 60-70% of time- he wants to place a foley (discussed risks/benefits) and then maybe remove it in 5-7 days to see if can void/empty.    Also discussed reducing overall drinking volume.   Discussed doing MRI- explained we've called, etc, but haven't heard back from Anesthesia.  Baclofen dose last night was more effective- ready for Am dose- just woke up so hasn't gotten yet.      ROS:  Pt denies SOB, abd pain, CP, N/V/C/D, and vision changes   Objective:   DG Sniff Test  Result Date: 10/14/2019 CLINICAL DATA:  Evaluate left diaphragm function. Question pneumonia EXAM: CHEST FLUOROSCOPY TECHNIQUE: Real-time fluoroscopic evaluation of the chest was performed. FLUOROSCOPY TIME:  Fluoroscopy Time:  1 minutes 18 seconds Radiation Exposure Index (if provided by the fluoroscopic device): Number of Acquired Spot Images: 0 COMPARISON:  Chest today FINDINGS: Normal motion of the right hemidiaphragm with inspiration and sniffing. Paradoxical elevation left hemidiaphragm during sniffing compatible with paresis. There is left lower lobe consolidation and left effusion which is layering. 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 Layering left effusion and left lower lobe consolidation. Normal movement of right diaphragm. Electronically Signed   By: Franchot Gallo M.D.   On: 10/14/2019 16:59   DG Chest Port 1 View  Result Date: 10/14/2019 CLINICAL DATA:  Shortness of breath, cough. EXAM: PORTABLE CHEST 1 VIEW COMPARISON:  October 07, 2019. FINDINGS: Stable cardiomediastinal silhouette. No pneumothorax is noted.  Minimal right basilar subsegmental atelectasis is noted. Moderate left pleural effusion is noted with associated left basilar atelectasis or infiltrate. Bony thorax is unremarkable. IMPRESSION: Moderate left pleural effusion is noted with associated left basilar atelectasis or infiltrate. Minimal right basilar subsegmental atelectasis is noted. Electronically Signed   By: Marijo Conception M.D.   On: 10/14/2019 12:55   Recent Labs    10/13/19 0431  WBC 6.3  HGB 11.6*  HCT 36.4*  PLT 264   Recent Labs    10/13/19 0431  NA 142  K 3.7  CL 103  CO2 31  GLUCOSE 95  BUN 15  CREATININE 0.86  CALCIUM 8.5*    Intake/Output Summary (Last 24 hours) at 10/15/2019 [id number] Last data filed at 10/14/2019 1755 Gross per 24 hour  Intake 240 ml  Output 800 ml  Net -560 ml     Physical Exam: Vital Signs Blood pressure 135/69, pulse 76, temperature 98 F (36.7 C), temperature source Oral, resp. rate 20, height 5\' 7"  (1.702 m), weight 97.9 kg, SpO2 95 %.   General: awake, alert, appropriate, just woke up, sitting up, NAD Frustrated because getting weaker in LEs and needs MRI Heart: RRR Lungs: before nebs- chronic coarse, a little wheezy- good air movement Abdomen: Soft, NT, ND, (+)BS   Extremities: No clubbing, cyanosis, or edema Skin: No evidence of breakdown, no evidence of rash  Musculoskeletal: Full range of motion in all 4 extremities. No joint swelling Neurologic: Cranial nerves II through XII intact, motor strength is 4/5 in bilateral deltoid, bicep, tricep, grip, bilateral  hip flexor, knee extensors, ankle dorsiflexor and plantar flexor all 2-/5- getting  weaker! Sensory exam normal sensation to light touch and proprioception in bilateral upper and lower extremities  Musculoskeletal: Full range of motion in all 4 extremities. No joint swelling Hamstring tone seen vs contracture    Assessment/Plan: 1. Functional deficits secondary to lumbar spinal stenosis with neurogenic  claudication which require 3+ hours per day of interdisciplinary therapy in a comprehensive inpatient rehab setting.  Physiatrist is providing close team supervision and 24 hour management of active medical problems listed below.  Physiatrist and rehab team continue to assess barriers to discharge/monitor patient progress toward functional and medical goals  Care Tool:  Bathing    Body parts bathed by patient: Right arm, Left arm, Chest, Abdomen, Front perineal area, Right upper leg, Left upper leg, Right lower leg, Left lower leg, Face   Body parts bathed by helper: Buttocks     Bathing assist Assist Level: Minimal Assistance - Patient > 75%     Upper Body Dressing/Undressing Upper body dressing   What is the patient wearing?: Pull over shirt    Upper body assist Assist Level: Maximal Assistance - Patient 25 - 49%    Lower Body Dressing/Undressing Lower body dressing      What is the patient wearing?: Pants, Underwear/pull up     Lower body assist Assist for lower body dressing: Total Assistance - Patient < 25%     Toileting Toileting    Toileting assist Assist for toileting: Dependent - Patient 0%     Transfers Chair/bed transfer  Transfers assist     Chair/bed transfer assist level: Dependent - mechanical lift     Locomotion Ambulation   Ambulation assist   Ambulation activity did not occur: Safety/medical concerns  Assist level: 2 helpers Assistive device: Parallel bars Max distance: 5'   Walk 10 feet activity   Assist  Walk 10 feet activity did not occur: Safety/medical concerns        Walk 50 feet activity   Assist Walk 50 feet with 2 turns activity did not occur: Safety/medical concerns         Walk 150 feet activity   Assist Walk 150 feet activity did not occur: Safety/medical concerns         Walk 10 feet on uneven surface  activity   Assist Walk 10 feet on uneven surfaces activity did not occur: Safety/medical  concerns         Wheelchair     Assist Will patient use wheelchair at discharge?: Yes Type of Wheelchair: Manual    Wheelchair assist level: Supervision/Verbal cueing Max wheelchair distance: 25'    Wheelchair 50 feet with 2 turns activity    Assist        Assist Level: Supervision/Verbal cueing   Wheelchair 150 feet activity     Assist      Assist Level: Supervision/Verbal cueing   Blood pressure 135/69, pulse 76, temperature 98 F (36.7 C), temperature source Oral, resp. rate 20, height 5\' 7"  (1.702 m), weight 97.9 kg, SpO2 95 %.    Medical Problem List and Plan: 1.Decreased functional ability due to lumbar stenosis/radiculopathy with neurogenic claudication. MRI's also suggest (albeit with motion artifact) distal thoracic involvement as well.  Exam is more c/w this too.              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 Antithrombotics: -DVT/anticoagulation:Lovenox.   4/12- Dopplers (-)- con't lovenox -antiplatelet therapy: Aspirin 81 mg daily 3. 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  4/26- MRI hopefully today  4/27- will change baclofen to 15 mg TID- with meals and 20 mg QHS  4/28- worked somewhat better last night 4. Mood:Provide emotional support -antipsychotic agents: N/A 5. Neuropsych: This patientiscapable of making decisions on hisown behalf. 6. Skin/Wound Care:place steristrips over right index finger lac.                          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    4/15- ordered new PRAFOs to keep feet from turning out/rotating out  4/16- PRAFOs came with kickstands                         -foam dressing left knee 7. Fluids/Electrolytes/Nutrition:Routine in and outs with follow-up chemistries. Diet changed to regular as per patient's preference. He states he would not follow current diet at home as is unappealing to him. I discussed that our goal is to simulate home environment as medications are adjusted according to current labs/vitals. I discussed that goal of heart healthy diet is to decrease sodium and saturated fat to protect the heart and circulatory system. Discussed his current diet and advised that he cut back on sugary juices, which he likes. Recommended replacing with water. He asked about his [employer] which he and his wife drink a lot of at home. Advised that despite it having no calories, it has a lot of processed ingredients and his goal should be to minimize processed foods. He expressed understanding. Continue nutritional counseling.   4/19: Electrolytes stable on BMP today. Calcium low at 8.7. Will start calcium supplement.  8. Chronic restless leg syndrome. Sinemet 10-100 mg twice daily. 9. Myoclonic jerking?. 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 OSA with CPAP/2 L chronic oxygen at home. Continue inhalers  4/23- checked with resp therapy and verified pt hasn't been receiving CPAP at night most nights- she will "work on it".  11. 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 Follow-up outpatient 12. Diabetes mellitus. Hemoglobin A1c 5.8. SSI. Changed to [facility] HS for CBG and SSI  4/17- pt refused to get BGs checked- stopped 4/12- A1c 5.8- 13. Neurogenic bowel and bladder. Check PVR  Last BM 4/24  MIralax and sorbitol, incont   4/27- Bowel incontinent- LBM 4pm yesterday afternoon  and smear overnight.  4/28- incontinent BMs- daily.   14. Hypothyroidism. Synthroid 15. Chronic diastolic congestive heart failure. Monitor for any signs of fluid overload. Check regular weights   Filed Weights   10/11/19 0500 10/13/19 0703 10/14/19 0500  Weight: 96.2 kg 97.9 kg 97.9 kg   Weight in range from 95-97 kg- no SOB or increased edema   4/27- Weights are stable- con't regimen 16. Hyperlipidemia. Zocor 17. Code status: Changed to DNR as per patient's wishes. 18. Leukocytosis - pneumonia-   4/12- on prednisone taper- the cause since afebrile and no Sx's of illness-con't steroid taper  4/20- WBC back up to 14.7- off prednisone-Tm 99.9 yesterday- will check U/A, U Cx,  and CXR  4/21-  U/A somewhat (+) and CXR also shows likely pneumonia- and (+) UTI as well- large leuk- negative nitrites- will start Vanc and Cefepime per pharmacy-  And ordered labs in AM  4/22- WBC went up to 18.6k- back down to 15.3 k- fever broke yesterday- feeling a little better- will check labs in AM for trend and then not over weekend, unless needed  4/23- WBC 10. 3k- better- con't Vanc/Cefepime, MRSA + UCx contaminants, BC x1 neg, day 4 abx  4/26- Still on O2 but sounds better- con't ABX  4/27- off O2- off ABX as of today.  19. Wheezing  Resolved on abx, scheduled umeclidinium  4/28- pulmonary saw pt- maximized meds  20 Spasticity vs tone in hamstrings,  chronically WC bound may be contracted  21. Neurogenic bladder  4/28- NOW retaining urine- will start Flomax 0.4 mg daily and place foley per wishes of pt.   15. Dispo- pt wants ot leave, but don't have idea of why weaker yet- pt asking me to call wife.    I spent a total of 40 minutes on care today due to calling wife and dealing with MRI issues and foley- d/w nursing, PA, and pt.   LOS: 19 days A FACE TO FACE EVALUATION WAS PERFORMED  [name] 10/15/2019, 9:22 AM

## 2019-10-15 NOTE — Plan of Care (Signed)
  Problem: RH SKIN INTEGRITY Goal: RH STG SKIN FREE OF INFECTION/BREAKDOWN Description: Patients skin will remain free from further infection or breakdown with mod assist. Outcome: Not Progressing Goal: RH STG MAINTAIN SKIN INTEGRITY WITH ASSISTANCE Description: STG Maintain Skin Integrity With mod Assistance. Outcome: Not Progressing Goal: RH STG ABLE TO PERFORM INCISION/WOUND CARE W/ASSISTANCE Description: STG Able To Perform Incision/Wound Care With total Assistance from caregiver. Outcome: Not Progressing   Problem: RH SAFETY Goal: RH STG ADHERE TO SAFETY PRECAUTIONS W/ASSISTANCE/DEVICE Description: STG Adhere to Safety Precautions With mod I Assistance/Device. Outcome: Not Progressing

## 2019-10-15 NOTE — Progress Notes (Signed)
Occupational Therapy Session Note  Patient Details  Name: [name] MRN: [id number] Date of Birth: 1940-12-14  Today's Date: 10/15/2019 OT Individual Time: 1100-1200 OT Individual Time Calculation (min): 60 min    Short Term Goals: Week 3:  OT Short Term Goal 1 (Week 3): STG=LTG secondary to ELOS  Skilled Therapeutic Interventions/Progress Updates:    Pt resting in bed upon arrival with wife present.  OT intervention with focus on bed mobility, sitting balance EOB, sit<>stand with [place], activity tolerance, and safety awareness to increase independence with BADLs. Supine>sit EOB with HOB elevated max A using bed rails. Static sitting balance with min A for facilitate trunk flexion. Dynamic sitting balance for forward lean with mod A. Pt required min verbal cues for upright sitting posture. Pt with R bias but can self correct today. Pt required max A for donning pullover shirt and tot A for donning shorts with sit<>stand in [place]. Sit<>stand in [place] X 3 with +3.  Pt transferred to w/c with Stedy.  Pt able to perform reciprocal scooting in w/c to reposition.  Pt sitting balance in w/c improved from yesterday with pt in more neutral postion and only a slight R bias. Pt remained seated in w/c with wife present and belt alarm activated. All needs within reach.   Therapy Documentation Precautions:  Precautions Precautions: Fall Restrictions Weight Bearing Restrictions: No   Pain:  Pt denies pain this morning but still has some tenderness on L ankle   Therapy/Group: Individual Therapy  [name] 10/15/2019, 12:10 PM

## 2019-10-16 ENCOUNTER — Inpatient Hospital Stay (HOSPITAL_COMMUNITY): Payer: Medicare Other | Admitting: Certified Registered"

## 2019-10-16 ENCOUNTER — Inpatient Hospital Stay (HOSPITAL_COMMUNITY): Admission: RE | Admit: 2019-10-16 | Payer: Medicare Other | Source: Home / Self Care

## 2019-10-16 ENCOUNTER — Inpatient Hospital Stay (HOSPITAL_COMMUNITY)
Admission: RE | Disposition: A | Discharge status: Other Acute Inpatient Hospital | Payer: Self-pay | Source: Other Acute Inpatient Hospital | Attending: Physical Medicine and Rehabilitation

## 2019-10-16 ENCOUNTER — Encounter (HOSPITAL_COMMUNITY): Payer: Self-pay | Admitting: Physical Medicine and Rehabilitation

## 2019-10-16 ENCOUNTER — Inpatient Hospital Stay (HOSPITAL_COMMUNITY): Payer: Medicare Other

## 2019-10-16 ENCOUNTER — Inpatient Hospital Stay (HOSPITAL_COMMUNITY): Payer: Medicare Other | Admitting: Physical Therapy

## 2019-10-16 ENCOUNTER — Other Ambulatory Visit: Payer: Self-pay

## 2019-10-16 HISTORY — PX: RADIOLOGY WITH ANESTHESIA: SHX6223

## 2019-10-16 LAB — GLUCOSE, CAPILLARY
Glucose-Capillary: 104 mg/dL — ABNORMAL HIGH (ref 70–99)
Glucose-Capillary: 122 mg/dL — ABNORMAL HIGH (ref 70–99)

## 2019-10-16 IMAGING — MR MR THORACIC SPINE WO/W CM
8 of 20 series · 16 of 48 positions shown · IV contrast (gadavist)
Comparison: MRI thoracic spine [DATE]. MRI lumbar spine
[DATE]

CLINICAL DATA: Acute neuro deficit. Spasticity in left leg
weakness.

EXAM:
MRI THORACIC AND LUMBAR SPINE WITHOUT AND WITH CONTRAST
TECHNIQUE: Multiplanar and multiecho pulse sequences of the thoracic and lumbar
spine were obtained without and with intravenous contrast.
CONTRAST:  9.5mL GADAVIST GADOBUTROL 1 MMOL/ML IV SOLN

[Series 3: T1 · sagittal · 3.0mm · 0.90mm/px · 1 of 20 slices shown (1 of 2)]
[im 1/20]
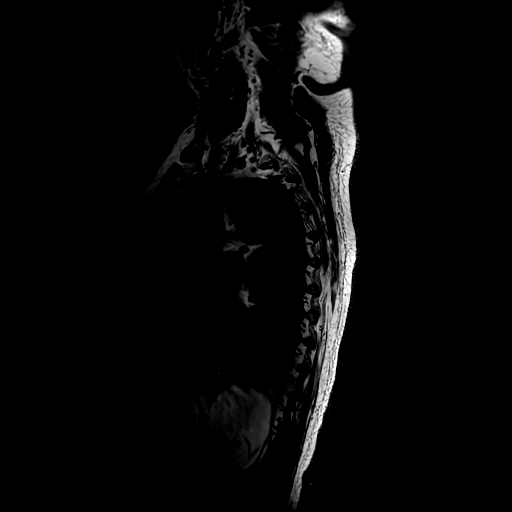

[Series 6: T2 · sagittal · 3.0mm · 0.66mm/px · 1 of 22 slices shown (1 of 6)]
[im 1/22]
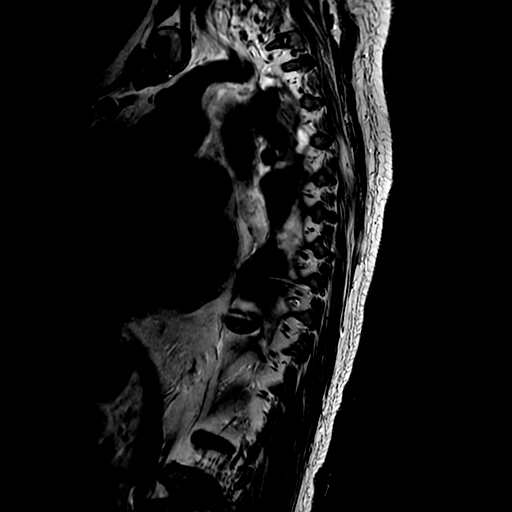

[Series 8: T1 · sagittal · 3.0mm · 0.66mm/px · 1 of 22 slices shown (2 of 2)]
[im 1/22]
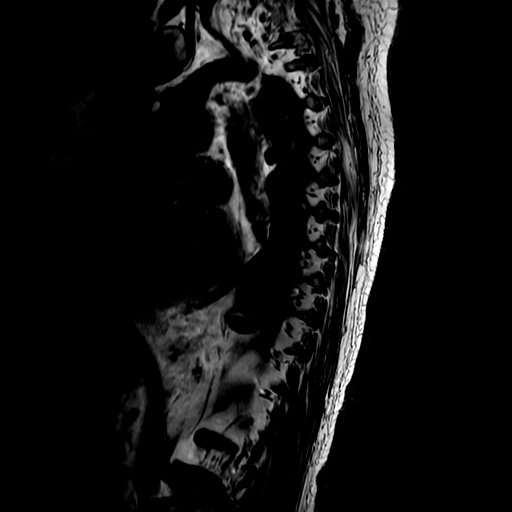

[Series 9: T2 · axial · 4.0mm · 0.39mm/px · z∈[-243,-71]mm · 3 of 34 slices shown (2 of 6)]
[im 1/34]
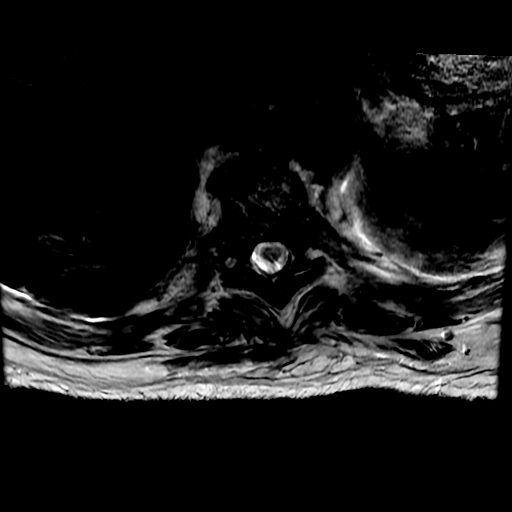
[im 17/34]
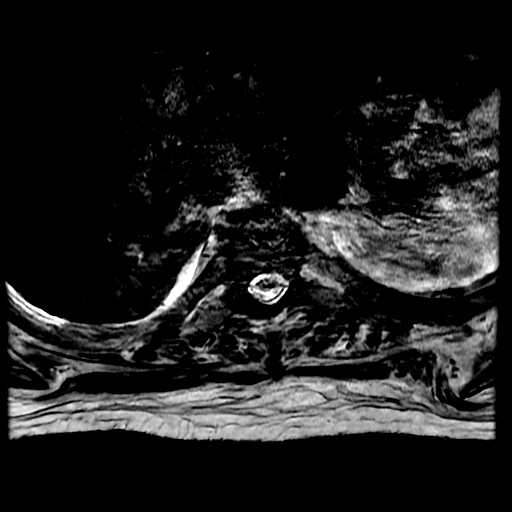
[im 34/34]
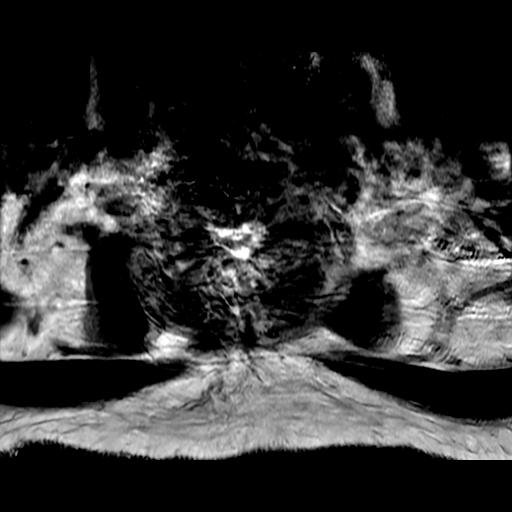

[Series 10: T2 · axial · 4.0mm · 0.39mm/px · z∈[-350,-169]mm · 3 of 34 slices shown (3 of 6)]
[im 1/34]
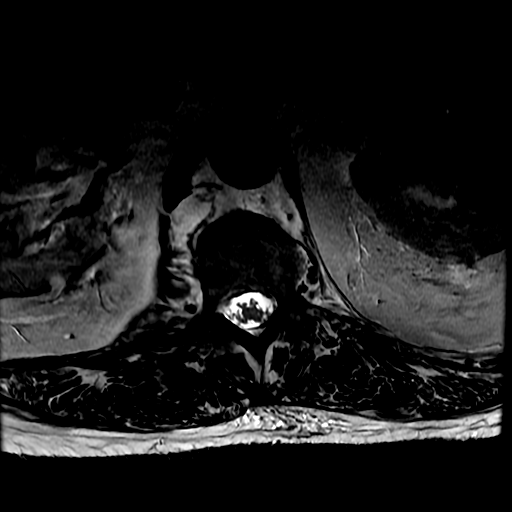
[im 17/34]
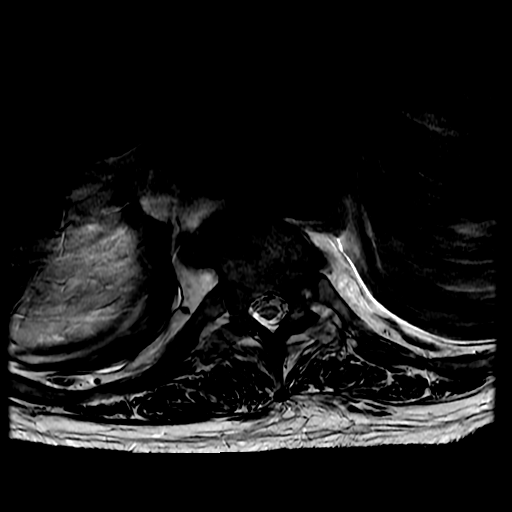
[im 34/34]
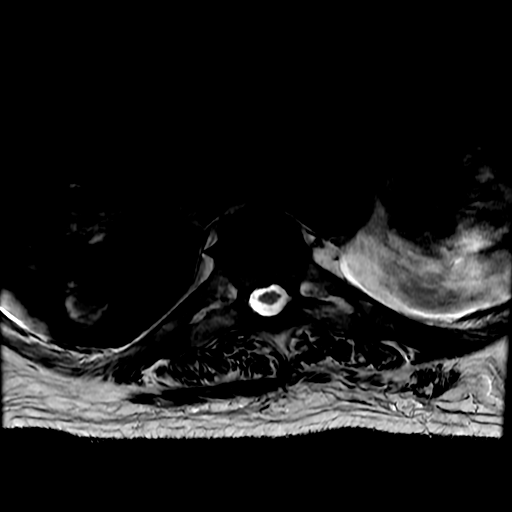

[Series 15: T2 · sagittal · 4.0mm · 0.47mm/px · 1 of 15 slices shown (4 of 6)]
[im 1/15]
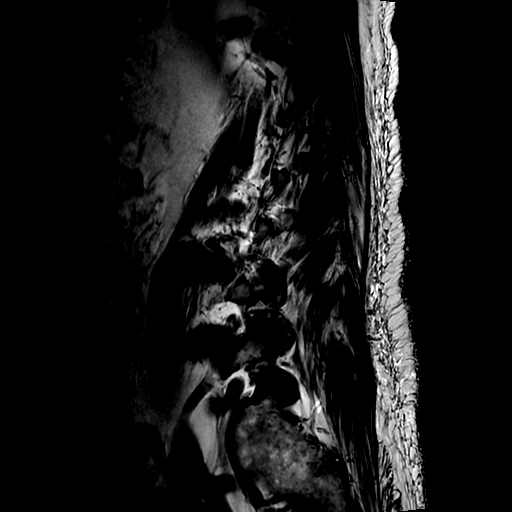

[Series 18: T2 · axial · 4.0mm · 0.39mm/px · z∈[-510,-298]mm · 3 of 39 slices shown (5 of 6)]
[im 1/39]
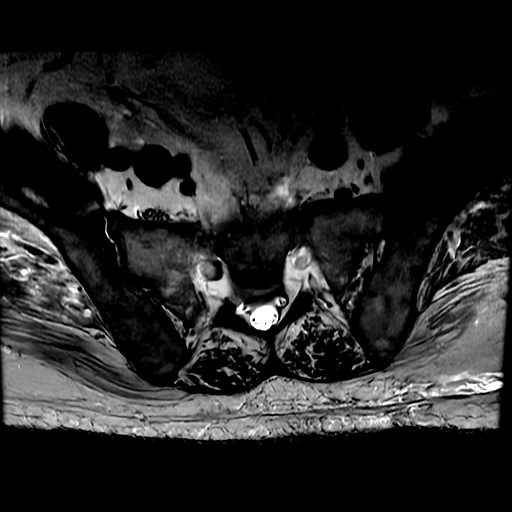
[im 20/39]
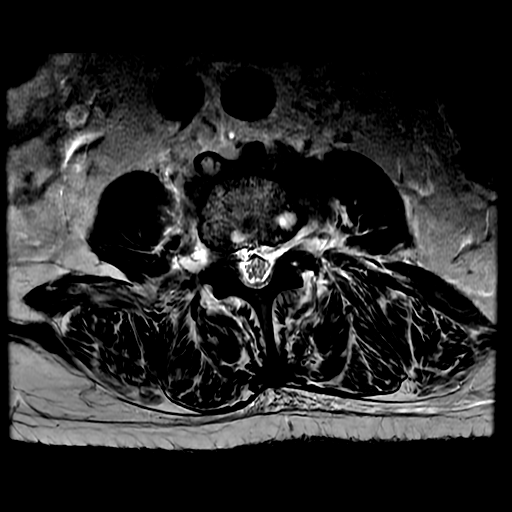
[im 39/39]
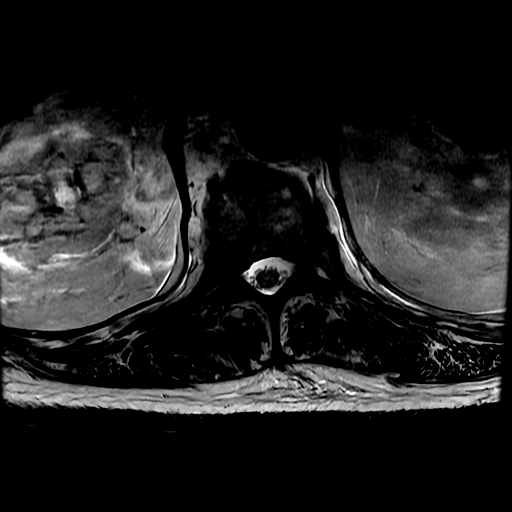

[Series 21: T2 · axial · 4.0mm · 0.39mm/px · z∈[-243,-71]mm · 3 of 34 slices shown (6 of 6)]
[im 1/34]
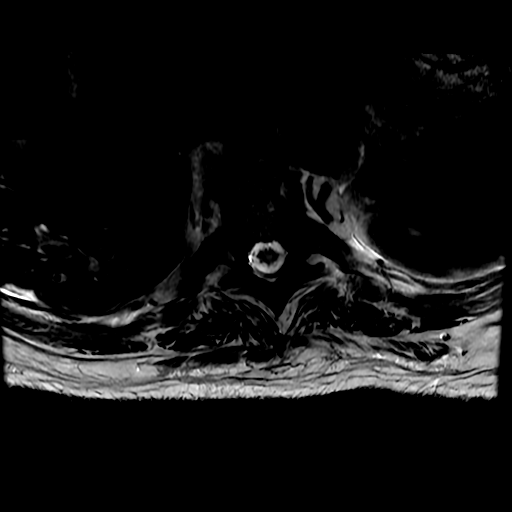
[im 17/34]
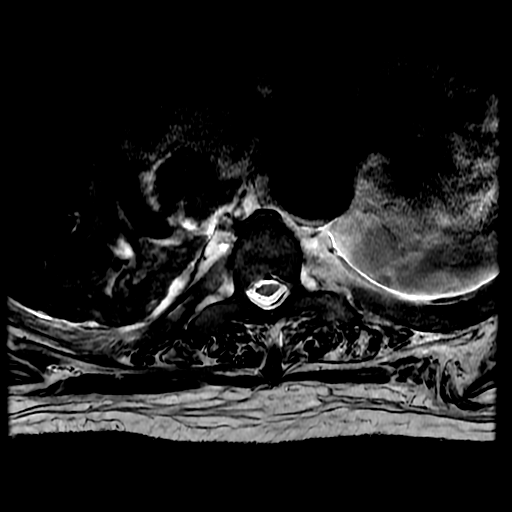
[im 34/34]
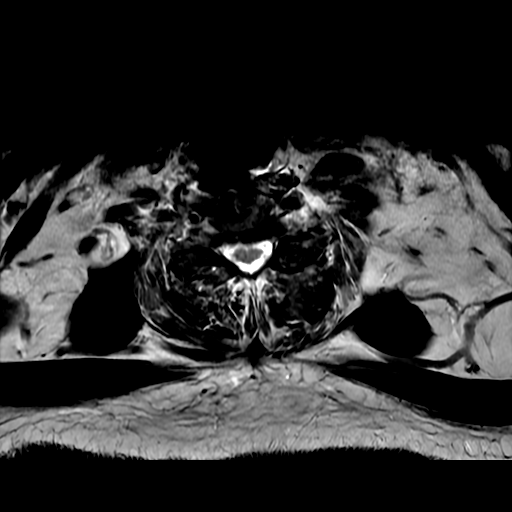

[16 of 48 positions shown; findings below may reference images not displayed]

FINDINGS: MRI THORACIC SPINE FINDINGS

Alignment:  Mild retro listhesis T1-2.  Remaining alignment normal.

Image quality degraded by mild motion however the study is
significantly better quality than the prior study. The patient was
scanned under general anesthesia.

Vertebrae: Mild chronic compression fracture of T6. No acute
fracture or mass. No evidence of spinal infection or discitis.

Cord: Small 1 mm syrinx at T8-T9 without significant mass. There is
moderate spinal stenosis at T9-10. No definite cord lesion. Abnormal
signal in the lower thoracic cord on the prior study was felt to be
artifact.

Paraspinal and other soft tissues: Mild to moderate left effusion.
Right lower lobe atelectasis or pneumonia.

Disc levels:

L1-2: Disc degeneration and spurring with mild spinal stenosis and
moderate foraminal stenosis bilaterally

T2-3: Negative

T3-4: Small central disc protrusion. Bilateral facet hypertrophy and
mild foraminal stenosis bilaterally

T4-5: Mild disc degeneration with small central disc protrusion.
Bilateral facet hypertrophy. Mild foraminal narrowing bilaterally

T5-6: Small central disc protrusion with mild facet degeneration. No
significant stenosis

T6-7: Mild disc and facet degeneration without stenosis

T7-8: Mild disc and facet degeneration. Small central disc
protrusion. Negative for stenosis.

T8-9: Small central disc protrusion with mild facet degeneration.
Negative for spinal or foraminal stenosis.

T9-10: Disc degeneration with diffuse endplate spurring and
bilateral facet hypertrophy. Mild to moderate spinal stenosis and
mild foraminal stenosis bilaterally.

T10-11: Mild disc and facet degeneration. Mild foraminal narrowing
bilaterally

T11-12: Right-sided disc protrusion with cord flattening on the
right and mild spinal stenosis. Mild foraminal narrowing
bilaterally. Bilateral facet degeneration

T12-L1: Mild right-sided disc protrusion with mild right foraminal
stenosis.

MRI LUMBAR SPINE FINDINGS

Segmentation:  Normal

Alignment: Mild retrolisthesis L2-3 and L3-4. Mild anterolisthesis
L4-5.

Vertebrae: Negative for fracture or mass. Discogenic sclerosis and
edema in the bone marrow at L3-4. Fatty changes in the bone marrow
at L4-5. No evidence of spinal infection.

Conus medullaris: Extends to the L1-2 level and appears normal.

Paraspinal and other soft tissues: Negative for paraspinous mass
adenopathy or fluid collection.

Disc levels:

L1-2: Broad-based central disc protrusion. Bilateral facet
hypertrophy. Mild to moderate spinal stenosis.

L2-3: Advanced disc degeneration with marked disc space narrowing
and endplate spurring as well as endplate fatty changes on the left.
Moderate facet hypertrophy. Mild to moderate spinal stenosis and
moderate subarticular stenosis bilaterally.

L3-4: Advanced disc degeneration with diffuse endplate spurring and
moderate facet hypertrophy. Severe spinal stenosis and severe
subarticular stenosis bilaterally. Severe right foraminal stenosis.

L4-5: Severe disc degeneration with diffuse endplate spurring.
Severe facet degeneration. Mild spinal stenosis. Severe subarticular
stenosis on the right and moderate subarticular stenosis on the left

L5-S1: Disc degeneration with endplate spurring. Right foraminal
disc protrusion with displacement of the right L5 nerve root lateral
to the foramen, unchanged from the prior study. Marked facet
degeneration bilaterally with moderate subarticular stenosis
bilaterally.
IMPRESSION: 1. Moderate spinal stenosis at T9-10 due to spondylosis. There is a
small syrinx above the stenosis. No other cord lesion identified in
the thoracic spine. Cord signal on the prior study felt to be due to
motion artifact.
2. Multilevel degenerative change throughout the thoracic spine with
disc and facet degeneration multiple levels. No cord compression.
3. Advanced multilevel spondylosis throughout the lumbar spine as
described above. No interval change from prior study.

## 2019-10-16 SURGERY — MRI WITH ANESTHESIA
Anesthesia: General

## 2019-10-16 MED ORDER — FENTANYL CITRATE (PF) 100 MCG/2ML IJ SOLN: 25.0000 ug | INTRAMUSCULAR | Status: DC | PRN

## 2019-10-16 MED ORDER — GADOBUTROL 1 MMOL/ML IV SOLN
9.5000 mL | Freq: Once | INTRAVENOUS | Status: AC | PRN
  Administered 2019-10-16: 9.5 mL via INTRAVENOUS

## 2019-10-16 MED ORDER — ONDANSETRON HCL 4 MG/2ML IJ SOLN: 4.0000 mg | Freq: Once | INTRAMUSCULAR | Status: DC | PRN

## 2019-10-16 MED ORDER — LACTATED RINGERS IV SOLN: INTRAVENOUS | Status: DC

## 2019-10-16 NOTE — Progress Notes (Signed)
This RN received report from Ivor Costa., RN from PACU re patient who accompanied him back to his room. Pt alert and oriented x 4. Denies SOB. Vital signs checked WNL and needs attended.

## 2019-10-16 NOTE — Progress Notes (Signed)
[facility] PHYSICAL MEDICINE & REHABILITATION PROGRESS NOTE    Subjective:  Spoke to anesthesia yesterday- was on schedule for today but no one informed physician to make him NPO, although asked for call back about timing.   Had cereal at 0730- is NPO from now on, for MRI.    ROS:  Pt denies SOB, abd pain, CP, N/V/C/D, and vision changes  Objective:   DG Sniff Test  Result Date: 10/14/2019 CLINICAL DATA:  Evaluate left diaphragm function. Question pneumonia EXAM: CHEST FLUOROSCOPY TECHNIQUE: Real-time fluoroscopic evaluation of the chest was performed. FLUOROSCOPY TIME:  Fluoroscopy Time:  1 minutes 18 seconds Radiation Exposure Index (if provided by the fluoroscopic device): Number of Acquired Spot Images: 0 COMPARISON:  Chest today FINDINGS: Normal motion of the right hemidiaphragm with inspiration and sniffing. Paradoxical elevation left hemidiaphragm during sniffing compatible with paresis. There is left lower lobe consolidation and left effusion which is layering. 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 Layering left effusion and left lower lobe consolidation. Normal movement of right diaphragm. Electronically Signed   By: Franchot Gallo M.D.   On: 10/14/2019 16:59   DG Chest Port 1 View  Result Date: 10/14/2019 CLINICAL DATA:  Shortness of breath, cough. EXAM: PORTABLE CHEST 1 VIEW COMPARISON:  October 07, 2019. FINDINGS: Stable cardiomediastinal silhouette. No pneumothorax is noted. Minimal right basilar subsegmental atelectasis is noted. Moderate left pleural effusion is noted with associated left basilar atelectasis or infiltrate. Bony thorax is unremarkable. IMPRESSION: Moderate left pleural effusion is noted with associated left basilar atelectasis or infiltrate. Minimal right basilar subsegmental atelectasis is noted. Electronically Signed   By: Marijo Conception M.D.   On: 10/14/2019 12:55   No results for input(s): WBC, HGB,  HCT, PLT in the last 72 hours. No results for input(s): NA, K, CL, CO2, GLUCOSE, BUN, CREATININE, CALCIUM in the last 72 hours.  Intake/Output Summary (Last 24 hours) at 10/16/2019 0905 Last data filed at 10/16/2019 0542 Gross per 24 hour  Intake --  Output 1350 ml  Net -1350 ml     Physical Exam: Vital Signs Blood pressure 124/66, pulse 74, temperature 98.4 F (36.9 C), resp. rate 18, height 5\' 7"  (1.702 m), weight 97.7 kg, SpO2 94 %.   General: awake, alert, sitting up in bed; NAD More appropriate, appreciative Heart: RRR Lungs: before nebs- chronic wheezing, coarse breath sounds- stable Abdomen:Soft, NT, ND, (+)BS  Extremities: No clubbing, cyanosis, or edema Skin: No evidence of breakdown, no evidence of rash Musculoskeletal: Full range of motion in all 4 extremities. No joint swelling Neurologic: Cranial nerves II through XII intact, motor strength is 4/5 in bilateral deltoid, bicep, tricep, grip, bilateral  hip flexor, knee extensors, ankle dorsiflexor and plantar flexor all 2-/5- getting weaker! Sensory exam normal sensation to light touch and proprioception in bilateral upper and lower extremities  Musculoskeletal: Full range of motion in all 4 extremities. No joint swelling Hamstring tone seen vs contracture    Assessment/Plan: 1. Functional deficits secondary to lumbar spinal stenosis with neurogenic claudication which require 3+ hours per day of interdisciplinary therapy in a comprehensive inpatient rehab setting.  Physiatrist is providing close team supervision and 24 hour management of active medical problems listed below.  Physiatrist and rehab team continue to assess barriers to discharge/monitor patient progress toward functional and medical goals  Care Tool:  Bathing    Body parts bathed by patient: Right arm, Left arm, Chest, Abdomen, Front perineal area, Right upper leg, Left  upper leg, Right lower leg, Left lower leg, Face   Body parts bathed by helper:  Buttocks     Bathing assist Assist Level: Minimal Assistance - Patient > 75%     Upper Body Dressing/Undressing Upper body dressing   What is the patient wearing?: Pull over shirt    Upper body assist Assist Level: Maximal Assistance - Patient 25 - 49%    Lower Body Dressing/Undressing Lower body dressing      What is the patient wearing?: Pants, Underwear/pull up     Lower body assist Assist for lower body dressing: Total Assistance - Patient < 25%     Toileting Toileting    Toileting assist Assist for toileting: Dependent - Patient 0%     Transfers Chair/bed transfer  Transfers assist     Chair/bed transfer assist level: Dependent - mechanical lift     Locomotion Ambulation   Ambulation assist   Ambulation activity did not occur: Safety/medical concerns  Assist level: 2 helpers Assistive device: Parallel bars Max distance: 5'   Walk 10 feet activity   Assist  Walk 10 feet activity did not occur: Safety/medical concerns        Walk 50 feet activity   Assist Walk 50 feet with 2 turns activity did not occur: Safety/medical concerns         Walk 150 feet activity   Assist Walk 150 feet activity did not occur: Safety/medical concerns         Walk 10 feet on uneven surface  activity   Assist Walk 10 feet on uneven surfaces activity did not occur: Safety/medical concerns         Wheelchair     Assist Will patient use wheelchair at discharge?: Yes Type of Wheelchair: Manual    Wheelchair assist level: Supervision/Verbal cueing Max wheelchair distance: 3'    Wheelchair 50 feet with 2 turns activity    Assist        Assist Level: Supervision/Verbal cueing   Wheelchair 150 feet activity     Assist      Assist Level: Supervision/Verbal cueing   Blood pressure 124/66, pulse 74, temperature 98.4 F (36.9 C), resp. rate 18, height 5\' 7"  (1.702 m), weight 97.7 kg, SpO2 94 %.    Medical Problem List and  Plan: 1.Decreased functional ability due to lumbar stenosis/radiculopathy with neurogenic claudication. MRI's also suggest (albeit with motion artifact) distal thoracic involvement as well.  Exam is more c/w this too.              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              -Continue CIR PT, OT -patient may shower -ELOS/Goals: 14-18 days, supervision to min assist with PT,OT 2. Antithrombotics: -DVT/anticoagulation:Lovenox.   4/12- Dopplers (-)- con't lovenox -antiplatelet therapy: Aspirin 81 mg daily 3. 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  4/26- MRI hopefully today  4/27- will change baclofen to 15 mg TID- with meals and 20 mg QHS  4/28- worked somewhat better last night 4. Mood:Provide emotional support -antipsychotic agents: N/A 5. Neuropsych: This patientiscapable of making decisions on hisown behalf. 6. Skin/Wound Care:place steristrips over right index finger lac.                          -continue wet to dry to chronic left lateral malleolus wound (stage -unstagable) 80% sloigh per nursing note.[name]  4/14- pt L lateral  malleolus healing- con't wound care; con't PRAFOs to get pressure off it.    4/15- ordered new PRAFOs to keep feet from turning out/rotating out  4/16- PRAFOs came with kickstands                         -foam dressing left knee 7. Fluids/Electrolytes/Nutrition:Routine in and outs with follow-up chemistries. Diet changed to regular as per patient's preference. He states he would not follow current diet at home as is unappealing to him. I discussed that our goal is to simulate home environment as medications are adjusted according to current labs/vitals. I discussed that goal of heart healthy diet is to decrease sodium and saturated fat to protect the heart and circulatory system. Discussed his current diet and advised that he cut back on sugary juices, which he likes. Recommended replacing with water. He asked about his [employer] which he and his wife drink a lot of at home. Advised that despite it having no calories, it has a lot of processed ingredients and his goal should be to minimize processed foods. He expressed understanding. Continue nutritional counseling.   4/19: Electrolytes stable on BMP today. Calcium low at 8.7. Will start calcium supplement.  8. Chronic restless leg syndrome. Sinemet 10-100 mg twice daily. 9. 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 OSA with CPAP/2 L chronic oxygen at home. Continue inhalers  4/23- checked with resp therapy and verified pt hasn't been receiving CPAP at night most nights- she will "work on it".  11. 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 Follow-up outpatient 12. Diabetes mellitus. Hemoglobin A1c 5.8. SSI. Changed to [facility] HS for CBG and SSI  4/17- pt refused to get BGs checked- stopped 4/12- A1c 5.8- 13. Neurogenic bowel and bladder. Check PVR  Last BM 4/24  MIralax and sorbitol, incont   4/27- Bowel incontinent- LBM  4pm yesterday afternoon and smear overnight.  4/28- incontinent BMs- daily.   14. Hypothyroidism. Synthroid 15. Chronic diastolic congestive heart failure. Monitor for any signs of fluid overload. Check regular weights   Filed Weights   10/13/19 0703 10/14/19 0500 10/16/19 0541  Weight: 97.9 kg 97.9 kg 97.7 kg   Weight in range from 95-97 kg- no SOB or increased edema   4/27- Weights are stable- con't regimen 16. Hyperlipidemia. Zocor 17. Code status: Changed to DNR as per patient's wishes. 18. Leukocytosis - pneumonia-   4/12- on prednisone taper- the cause since afebrile and no Sx's of illness-con't steroid taper  4/20-  WBC back up to 14.7- off prednisone-Tm 99.9 yesterday- will check U/A, U Cx,  and CXR  4/21-  U/A somewhat (+) and CXR also shows likely pneumonia- and (+) UTI as well- large leuk- negative nitrites- will start Vanc and Cefepime per pharmacy-  And ordered labs in AM  4/22- WBC went up to 18.6k- back down to 15.3 k- fever broke yesterday- feeling a little better- will check labs in AM for trend and then not over weekend, unless needed  4/23- WBC 10. 3k- better- con't Vanc/Cefepime, MRSA + UCx contaminants, BC x1 neg, day 4 abx  4/26- Still on O2 but sounds better- con't ABX  4/27- off O2- off ABX as of today.   19. Wheezing  Resolved on abx, scheduled umeclidinium  4/28- pulmonary saw pt- maximized meds  20 Spasticity vs tone in hamstrings, chronically WC bound may be contracted  21. Neurogenic bladder  4/28- NOW retaining urine- will start Flomax 0.4 mg daily and place foley per wishes of pt.   56. Dispo- pt wants ot leave, but don't have idea of why weaker yet- pt asking me to call wife.   4/29- when spoke to wife, she noted she cannot care for pt- cannot send him home "like this".    LOS: 20 days A FACE TO FACE EVALUATION WAS PERFORMED  Lio Wehrly 10/16/2019, 9:05 AM

## 2019-10-16 NOTE — Transfer of Care (Signed)
Immediate Anesthesia Transfer of Care Note  Patient: [name]  Procedure(s) Performed: MRI THORACIC LUMBAR SPINE (N/A )  Patient Location: PACU  Anesthesia Type:General  Level of Consciousness: responds to stimulation  Airway & Oxygen Therapy: Patient remains intubated per anesthesia plan and Patient placed on Ventilator (see vital sign flow sheet for setting)  Post-op Assessment: Report given to RN and Post -op Vital signs reviewed and stable  Post vital signs: Reviewed and stable  Last Vitals:  Vitals Value Taken Time  BP 141/81 10/16/19 1727  Temp    Pulse 113 10/16/19 1731  Resp 10 10/16/19 1731  SpO2 95 % 10/16/19 1731  Vitals shown include unvalidated device data.  Last Pain:  Vitals:   10/16/19 1334  TempSrc:   PainSc: 0-No pain      Patients Stated Pain Goal: 3 ([id number])  Complications: Patient re-intubated

## 2019-10-16 NOTE — Progress Notes (Signed)
Last oral intake aside from medications was bowl of cereal at 0730. Patient advised to remain NPO awaiting MRI schedule

## 2019-10-16 NOTE — Progress Notes (Signed)
Pt back at baseline. Ok to send Pt to floor per Dr Ola Spurr.

## 2019-10-16 NOTE — Progress Notes (Signed)
Occupational Therapy Weekly Progress Note  Patient Details  Name: [name] MRN: [id number] Date of Birth: 08/16/1940  Beginning of progress report period: October 10, 2019 End of progress report period: October 16, 2019   Pt has experienced medical issues during the past week (pneumonia, lethargy, increased BLE spasms) which has impacted his progress with functional transfers, sitting balance, BADLs, sit<>stand, and standing.  Pt currently requires tot A+2 or 3 for sit<>stand in [place].  Pt unable to power up into upright posture when standing in [place]. Pt is tot A/dependent for LB dressing tasks at bed level or sitting EOB. Pt requires mod A for UB dressing sitting EOB. Pt's discharge date has been placed on hold awaiting resolution of medical issues.  Patient continues to demonstrate the following deficits:muscle weakness,decreased cardiorespiratoy endurance,decreased coordinationand decreased standing balance, decreased postural control and decreased balance strategiesand therefore will continue to benefit from skilled OT intervention to enhance overall performance with BADL and iADL.  Patient progressing toward long term goals..  Continue plan of care.  OT Short Term Goals Week 3:  OT Short Term Goal 1 (Week 3): STG=LTG secondary to ELOS Week 4:    OT Short Term Goal 1 (Week 3): STG=LTG secondary to ELOS   [name] 10/16/2019, 2:39 PM

## 2019-10-16 NOTE — Progress Notes (Signed)
Physical Therapy Session Note  Patient Details  Name: KAMARRION SCHOESSOW MRN: [id number] Date of Birth: [birth year]  Today's Date: 10/16/2019 PT Individual Time: 0900-1000 PT Individual Time Calculation (min): 60 min   Short Term Goals: Week 3:  PT Short Term Goal 1 (Week 3): =LTG due to ELOS  Skilled Therapeutic Interventions/Progress Updates:    Pt received seated in bed, agreeable to PT session. Pt reports ongoing pain in L ankle at site of wound, not rated. Pt also with increase in BLE spasms this AM, unsure if able to receive medications due to possible NPO status pending MRI later this afternoon. RN notified of pt's current functional level with spasms and also notified to assess L ankle wound. Assisted pt with donning pants at bed level with assist x 2, max A for rolling R/L and dependent to don pants. Unable to come to sitting EOB due to spasms in BLE limiting ability to perform bed mobility this date. Session focus on endurance and UE strengthening at bed level. Seated BUE 3# dowel rod exercises: bicep curls and chest press 2 x 10 reps, decreased chest press to 1# for 2nd bout due to UE fatigue. Seated ball PNF diagonals L/R 2 x 10 reps. Pt on 2L O2 throughout session, SpO2 remains at 91% and higher with activity. Pt left seated in bed with needs in reach, bed alarm in place at end of session.  Therapy Documentation Precautions:  Precautions Precautions: Fall Restrictions Weight Bearing Restrictions: No    Therapy/Group: Individual Therapy   Excell Seltzer, PT, DPT  10/16/2019, 12:09 PM

## 2019-10-16 NOTE — Anesthesia Preprocedure Evaluation (Addendum)
Anesthesia Evaluation  Patient identified by MRN, date of birth, ID band Patient awake    Reviewed: Allergy & Precautions, NPO status , Patient's Chart, lab work & pertinent test results  Airway Mallampati: II  TM Distance: >3 FB Neck ROM: Full    Dental  (+) Edentulous Upper, Edentulous Lower   Pulmonary sleep apnea and Continuous Positive Airway Pressure Ventilation , COPD,  COPD inhaler, former smoker,    Pulmonary exam normal breath sounds clear to auscultation       Cardiovascular hypertension, + Peripheral Vascular Disease and +CHF  Normal cardiovascular exam+ Valvular Problems/Murmurs AI  Rhythm:Regular Rate:Normal  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  Annual imaging recommended.  ECG: SR, rate 69  ECHO: 1. Normal LV systolic function; mild LVH; mild LAE; mildly dilated aortic root; mild AI. 2. Left ventricular ejection fraction, by estimation, is 55 to 60%. The left ventricle has normal function. The left ventricle has no regional wall motion abnormalities. There is mild left ventricular hypertrophy. Left ventricular diastolic parameters were normal. 3. Right ventricular systolic function is normal. The right ventricular size is normal. 4. Left atrial size was mildly dilated. 5. The mitral valve is normal in structure. No evidence of mitral valve regurgitation. No evidence of mitral stenosis. 6. The aortic valve is tricuspid. Aortic valve regurgitation is mild. Mild to moderate aortic valve sclerosis/calcification is present, without any evidence of aortic stenosis. 7. Aortic dilatation noted. There is mild dilatation of the aortic root measuring 40 mm. 8. The inferior vena cava is normal in size with greater than 50% respiratory variability, suggesting right atrial pressure of 3 mmHg.   Neuro/Psych  Headaches,  Neuromuscular disease negative psych ROS   GI/Hepatic Neg liver ROS, GERD   Medicated and Controlled,  Endo/Other  diabetes, Oral Hypoglycemic AgentsHypothyroidism   Renal/GU negative Renal ROS     Musculoskeletal  (+) Arthritis , Gout Wheelchair bound   Abdominal (+) + obese,   Peds  Hematology  (+) anemia ,   Anesthesia Other Findings SPINAL CORD INJURY  Reproductive/Obstetrics                           Anesthesia Physical Anesthesia Plan  ASA: III  Anesthesia Plan: General   Post-op Pain Management:    Induction: Intravenous  PONV Risk Score and Plan: 2 and Ondansetron, Dexamethasone and Treatment may vary due to age or medical condition  Airway Management Planned: Oral ETT  Additional Equipment:   Intra-op Plan:   Post-operative Plan: Extubation in OR  Informed Consent: I have reviewed the patients History and Physical, chart, labs and discussed the procedure including the risks, benefits and alternatives for the proposed anesthesia with the patient or authorized representative who has indicated his/her understanding and acceptance.       Plan Discussed with: CRNA  Anesthesia Plan Comments:         Anesthesia Quick Evaluation

## 2019-10-16 NOTE — Progress Notes (Signed)
RT NOTES: Patient placed on pressure support 12/5 60% per Dr Roanna Banning.

## 2019-10-16 NOTE — Anesthesia Postprocedure Evaluation (Signed)
Anesthesia Post Note  Patient: SHABAZZ WEIMAN  Procedure(s) Performed: MRI THORACIC LUMBAR SPINE (N/A )     Patient location during evaluation: PACU Anesthesia Type: General Level of consciousness: awake and patient cooperative Pain management: pain level controlled Vital Signs Assessment: post-procedure vital signs reviewed and stable Respiratory status: spontaneous breathing, nonlabored ventilation, respiratory function stable and patient connected to face mask oxygen Cardiovascular status: blood pressure returned to baseline and stable Postop Assessment: no apparent nausea or vomiting Anesthetic complications: yes Anesthetic complication details: required intubation   Last Vitals:  Vitals:   10/16/19 1812 10/16/19 1827  BP: 119/67 115/65  Pulse: 99 100  Resp: 20 19  Temp:    SpO2: 96% 93%    Last Pain:  Vitals:   10/16/19 1827  TempSrc:   PainSc: 0-No pain                 Muhanad Torosyan

## 2019-10-17 ENCOUNTER — Inpatient Hospital Stay (HOSPITAL_COMMUNITY): Payer: Medicare Other | Admitting: Physical Therapy

## 2019-10-17 ENCOUNTER — Inpatient Hospital Stay (HOSPITAL_COMMUNITY): Payer: Medicare Other | Admitting: Occupational Therapy

## 2019-10-17 ENCOUNTER — Ambulatory Visit: Payer: Medicare Other | Admitting: Podiatry

## 2019-10-17 NOTE — Consult Note (Addendum)
Neurosurgery Consultation  Reason for Consult: Leg weakness Referring Physician: Lovorn  CC: Leg weakness / spasticity  HPI: This is a 79 y.o. man that presents with progressive BLE weakness. He had an ACDF roughly 2y ago and reports he was having BUE clumsiness at that time. From what he described, it sounds like he had improvement in function post-op but has recently had worsening restless legs, myoclonic jerking, bowel incontinence, and worsening function in the BLE. He reports he was last ambulatory roughly 1-2y ago, thinks that the incontinence started at that time as well. He has some dec'd propioception in the RUE > LUE, but feels that this and his fine/gross motor function in the BUE have been stable over this time period. He was admitted to CIR for worsening function and has had continued decrement despite therapies, prompting further imaging and neurosurgical consultation.    ROS: A 14 point ROS was performed and is negative except as noted in the HPI.   PMHx:  Past Medical History:  Diagnosis Date  . Arthritis   . Barrett esophagus   . Bronchitis   . Carotid artery occlusion   . COPD (chronic obstructive pulmonary disease) ([place])   . Diabetes mellitus without complication ([place])   . GERD (gastroesophageal reflux disease)   . Headache    migraines  . History of thyroid cancer 2008  . Hypertension   . Restless leg   . Sleep apnea with use of continuous positive airway pressure (CPAP)    2011 [facility] , AHI  77cn central and obstrcutive. 16 cm water , 3 cm EPR.   . Thoracic ascending aortic aneurysm ([facility])    4.2 cm ascending TAA 08/18/17 CTA. Annual imaging recommended.  . thyroid ca dx'd 2009 or 2010   surg and radioactive isoptope  . Ulcer    Peptic ulcer disease   FamHx:  Family History  Problem Relation Age of Onset  . COPD Mother   . Hyperlipidemia Mother   . Hypertension Mother   . Diabetes Father   . Kidney disease Father        ESRD  . Hypertension  Father   . Cancer Father   . Hyperlipidemia Father    SocHx:  reports that he quit smoking about 14 years ago. His smoking use included cigars and cigarettes. He quit after 40.00 years of use. He has never used smokeless tobacco. He reports that he does not drink alcohol or use drugs.  Exam: Vital signs in last 24 hours: Temp:  [98.1 F (36.7 C)-98.6 F (37 C)] 98.6 F (37 C) (04/30 0430) Pulse Rate:  [81-118] 81 (04/30 0430) Resp:  [13-31] 16 (04/30 0430) BP: (105-141)/(64-88) 105/64 (04/30 0430) SpO2:  [92 %-97 %] 97 % (04/30 0819) FiO2 (%):  [60 %] 60 % (04/29 1757) Weight:  [95.7 kg-97.7 kg] 95.7 kg (04/30 0642) General: Awake, alert, cooperative, sitting in wheelchair in NAD Head: Normocephalic and atruamatic HEENT: Neck supple Pulmonary: breathing supplemental O2 via [place] comfortably, no evidence of increased work of breathing Cardiac: RRR Abdomen: S NT ND, +umbilical sensory level Extremities: +stigmata of venous insufficiency in BLE, some decrease in bulk in BLE Neuro: AOx3, gaze neutral Strength in BUE 4 to 4+/5 diffusely with intact sensation, +spasticity on extension bilaterally BLE difficult to assess strength / reflexes because of myoclonic jerks whenever the leg is manipulated BLE strength roughly 3/5, no babinski present, no ankle clonus but again difficult to test 2/2 proximal leg spasms with ankle manipulation as well  as left ankle pain 2/2 a pressure sore   Assessment and Plan: 79 y.o. man w/ progressive BLE loss of function, incontinence, and low thoracic sensory level. H/o prior ACDF for severe cord compression. MRI C/T/L-spine from 3/31 and 4/29 personally reviewed, which show:  Cervical: post-op changes from ACDF at C3-4/4-5/5-6. 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   Lumbar:  Canal stenosis worst at L3-4, where it is severe. There is also mild to moderate canal stenosis at L1-2 & L2-3; R>L L4-5 lateral recess stenosis, and a right L5-S1 far lateral disc herniation. Conus visualized at L1-2 and appears normal.  -clinical exam findings out of proportion to structural findings on imaging. 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 If it is a spinal cause, I think his exam clearly localizes to a thoracic cause with LLE>RLE weakness and spasticity with bowel/bladder incontinence. I therefore do not think his radiographically severe lumbar disease is likely to be the cause of his progression. -I discussed with neurology to have them evaluate the patient to see if they can come up with an alternative cause. His imaging findings are inconsistent with a dAVF. If they are unable to find an alternative cause, my next recommendation would be a spinal angiogram to r/o dAVF.   Judith Part, MD 10/17/19 11:24 AM [facility]

## 2019-10-17 NOTE — Progress Notes (Signed)
[place] PHYSICAL MEDICINE & REHABILITATION PROGRESS NOTE    Subjective:  Pt reports no change in weakness- still very weak in LEs.  Slept OK.  Did tolerate MRIs yesterday.    ROS:  Pt denies SOB, abd pain, CP, N/V/C/D, and vision changes   Objective:   MR THORACIC SPINE W WO CONTRAST  Result Date: 10/16/2019 CLINICAL DATA:  Acute neuro deficit. Spasticity in left leg weakness. EXAM: MRI THORACIC AND LUMBAR SPINE WITHOUT AND WITH CONTRAST TECHNIQUE: Multiplanar and multiecho pulse sequences of the thoracic and lumbar spine were obtained without and with intravenous contrast. CONTRAST:  9.79mL GADAVIST GADOBUTROL 1 MMOL/ML IV SOLN COMPARISON:  MRI thoracic spine 09/17/2019. MRI lumbar spine 09/18/2019 FINDINGS: MRI THORACIC SPINE FINDINGS Alignment:  Mild retro listhesis T1-2.  Remaining alignment normal. Image quality degraded by mild motion however the study is significantly better quality than the prior study. The patient was scanned under general anesthesia. Vertebrae: Mild chronic compression fracture of T6. No acute fracture or mass. No evidence of spinal infection or discitis. 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 There is moderate spinal stenosis at T9-10. No definite cord lesion. Abnormal signal in the lower thoracic cord on the prior study was felt to be artifact. Paraspinal and other soft tissues: Mild to moderate left effusion. Right lower lobe atelectasis or pneumonia. Disc levels: L1-2: Disc degeneration and spurring with mild spinal stenosis and moderate foraminal stenosis bilaterally T2-3: Negative T3-4: Small central disc protrusion. Bilateral facet hypertrophy and mild foraminal stenosis bilaterally T4-5: Mild disc degeneration with small central disc protrusion. Bilateral facet hypertrophy. Mild foraminal narrowing bilaterally T5-6: Small central disc protrusion with mild facet degeneration. No significant stenosis T6-7: Mild disc and facet degeneration  without stenosis T7-8: Mild disc and facet degeneration. Small central disc protrusion. Negative for stenosis. T8-9: Small central disc protrusion with mild facet degeneration. Negative for spinal or foraminal stenosis. T9-10: Disc degeneration with diffuse endplate spurring and bilateral facet hypertrophy. Mild to moderate spinal stenosis and mild foraminal stenosis bilaterally. T10-11: Mild disc and facet degeneration. Mild foraminal narrowing bilaterally T11-12: Right-sided disc protrusion with cord flattening on the right and mild spinal stenosis. Mild foraminal narrowing bilaterally. Bilateral facet degeneration T12-L1: Mild right-sided disc protrusion with mild right foraminal stenosis. MRI LUMBAR SPINE FINDINGS Segmentation:  Normal Alignment: Mild retrolisthesis L2-3 and L3-4. Mild anterolisthesis L4-5. Vertebrae: Negative for fracture or mass. Discogenic sclerosis and edema in the bone marrow at L3-4. Fatty changes in the bone marrow at L4-5. No evidence of spinal infection. Conus medullaris: Extends to the L1-2 level and appears normal. Paraspinal and other soft tissues: Negative for paraspinous mass adenopathy or fluid collection. Disc levels: L1-2: Broad-based central disc protrusion. Bilateral facet hypertrophy. Mild to moderate spinal stenosis. L2-3: Advanced disc degeneration with marked disc space narrowing and endplate spurring as well as endplate fatty changes on the left. Moderate facet hypertrophy. Mild to moderate spinal stenosis and moderate subarticular stenosis bilaterally. L3-4: Advanced disc degeneration with diffuse endplate spurring and moderate facet hypertrophy. Severe spinal stenosis and severe subarticular stenosis bilaterally. Severe right foraminal stenosis. L4-5: Severe disc degeneration with diffuse endplate spurring. Severe facet degeneration. Mild spinal stenosis. Severe subarticular stenosis on the right and moderate subarticular stenosis on the left L5-S1: Disc degeneration  with endplate spurring. Right foraminal disc protrusion with displacement of the right L5 nerve root lateral to the foramen, unchanged from the prior study. Marked facet degeneration bilaterally with moderate subarticular stenosis bilaterally. IMPRESSION: 1. Moderate spinal stenosis  at T9-10 due to spondylosis. 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 No other cord lesion identified in the thoracic spine. Cord signal on the prior study felt to be due to motion artifact. 2. Multilevel degenerative change throughout the thoracic spine with disc and facet degeneration multiple levels. No cord compression. 3. 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 No interval change from prior study. Electronically Signed   By: Franchot Gallo M.D.   On: 10/16/2019 19:38   MR Lumbar Spine W Wo Contrast  Result Date: 10/16/2019 CLINICAL DATA:  Acute neuro deficit. Spasticity in left leg weakness. EXAM: MRI THORACIC AND LUMBAR SPINE WITHOUT AND WITH CONTRAST TECHNIQUE: Multiplanar and multiecho pulse sequences of the thoracic and lumbar spine were obtained without and with intravenous contrast. CONTRAST:  9.65mL GADAVIST GADOBUTROL 1 MMOL/ML IV SOLN COMPARISON:  MRI thoracic spine 09/17/2019. MRI lumbar spine 09/18/2019 FINDINGS: MRI THORACIC SPINE FINDINGS Alignment:  Mild retro listhesis T1-2.  Remaining alignment normal. Image quality degraded by mild motion however the study is significantly better quality than the prior study. The patient was scanned under general anesthesia. Vertebrae: Mild chronic compression fracture of T6. No acute fracture or mass. No evidence of spinal infection or discitis. 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 There is moderate spinal stenosis at T9-10. No definite cord lesion. Abnormal signal in the lower thoracic cord on the prior study was felt to be artifact. Paraspinal and other soft tissues: Mild to moderate left effusion. Right lower  lobe atelectasis or pneumonia. Disc levels: L1-2: Disc degeneration and spurring with mild spinal stenosis and moderate foraminal stenosis bilaterally T2-3: Negative T3-4: Small central disc protrusion. Bilateral facet hypertrophy and mild foraminal stenosis bilaterally T4-5: Mild disc degeneration with small central disc protrusion. Bilateral facet hypertrophy. Mild foraminal narrowing bilaterally T5-6: Small central disc protrusion with mild facet degeneration. No significant stenosis T6-7: Mild disc and facet degeneration without stenosis T7-8: Mild disc and facet degeneration. Small central disc protrusion. Negative for stenosis. T8-9: Small central disc protrusion with mild facet degeneration. Negative for spinal or foraminal stenosis. T9-10: Disc degeneration with diffuse endplate spurring and bilateral facet hypertrophy. Mild to moderate spinal stenosis and mild foraminal stenosis bilaterally. T10-11: Mild disc and facet degeneration. Mild foraminal narrowing bilaterally T11-12: Right-sided disc protrusion with cord flattening on the right and mild spinal stenosis. Mild foraminal narrowing bilaterally. Bilateral facet degeneration T12-L1: Mild right-sided disc protrusion with mild right foraminal stenosis. MRI LUMBAR SPINE FINDINGS Segmentation:  Normal Alignment: Mild retrolisthesis L2-3 and L3-4. Mild anterolisthesis L4-5. Vertebrae: Negative for fracture or mass. Discogenic sclerosis and edema in the bone marrow at L3-4. Fatty changes in the bone marrow at L4-5. No evidence of spinal infection. Conus medullaris: Extends to the L1-2 level and appears normal. Paraspinal and other soft tissues: Negative for paraspinous mass adenopathy or fluid collection. Disc levels: L1-2: Broad-based central disc protrusion. Bilateral facet hypertrophy. Mild to moderate spinal stenosis. L2-3: Advanced disc degeneration with marked disc space narrowing and endplate spurring as well as endplate fatty changes on the left.  Moderate facet hypertrophy. Mild to moderate spinal stenosis and moderate subarticular stenosis bilaterally. L3-4: Advanced disc degeneration with diffuse endplate spurring and moderate facet hypertrophy. Severe spinal stenosis and severe subarticular stenosis bilaterally. Severe right foraminal stenosis. L4-5: Severe disc degeneration with diffuse endplate spurring. Severe facet degeneration. Mild spinal stenosis. Severe subarticular stenosis on the right and moderate subarticular stenosis on the left L5-S1: Disc degeneration with endplate spurring. Right  foraminal disc protrusion with displacement of the right L5 nerve root lateral to the foramen, unchanged from the prior study. Marked facet degeneration bilaterally with moderate subarticular stenosis bilaterally. IMPRESSION: 1. 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 No other cord lesion identified in the thoracic spine. Cord signal on the prior study felt to be due to motion artifact. 2. Multilevel degenerative change throughout the thoracic spine with disc and facet degeneration multiple levels. No cord compression. 3. 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 No interval change from prior study. Electronically Signed   By: Franchot Gallo M.D.   On: 10/16/2019 19:38   No results for input(s): WBC, HGB, HCT, PLT in the last 72 hours. No results for input(s): NA, K, CL, CO2, GLUCOSE, BUN, CREATININE, CALCIUM in the last 72 hours.  Intake/Output Summary (Last 24 hours) at 10/17/2019 0903 Last data filed at 10/17/2019 0500 Gross per 24 hour  Intake 120 ml  Output 1300 ml  Net -1180 ml     Physical Exam: Vital Signs Blood pressure 105/64, pulse 81, temperature 98.6 F (37 C), temperature source Oral, resp. rate 16, height 5\' 7"  (1.702 m), weight 95.7 kg, SpO2 97 %.   General: awake, alert, appropriate, just woke up; took off CPAP, NAD More  appropriate Heart: RRR Lungs: before nebs- a little coarse- less than normal; a little wheezing; coughing some Abdomen:Soft, NT, ND, (+)BS   Extremities: No clubbing, cyanosis, or edema Skin: No evidence of breakdown, no evidence of rash Musculoskeletal: Full range of motion in all 4 extremities. No joint swelling Neurologic: Cranial nerves II through XII intact, motor strength is 4/5 in bilateral deltoid, bicep, tricep, grip, bilateral  hip flexor, knee extensors, ankle dorsiflexor and plantar flexor all 2-/5- getting weaker! Rechecked today- 2/5 in LLE  And 1-2- /5in RLE- RLE weaker than LLE- still getting weaker- Sensory exam has sensory level at T10 - decreased from T10 downwards Musculoskeletal: Full range of motion in all 4 extremities. No joint swelling Hamstring tone seen vs contracture    Assessment/Plan: 1. Functional deficits secondary to lumbar spinal stenosis with neurogenic claudication which require 3+ hours per day of interdisciplinary therapy in a comprehensive inpatient rehab setting.  Physiatrist is providing close team supervision and 24 hour management of active medical problems listed below.  Physiatrist and rehab team continue to assess barriers to discharge/monitor patient progress toward functional and medical goals  Care Tool:  Bathing    Body parts bathed by patient: Right arm, Left arm, Chest, Abdomen, Front perineal area, Right upper leg, Left upper leg, Right lower leg, Left lower leg, Face   Body parts bathed by helper: Buttocks     Bathing assist Assist Level: Minimal Assistance - Patient > 75%     Upper Body Dressing/Undressing Upper body dressing   What is the patient wearing?: Pull over shirt    Upper body assist Assist Level: Maximal Assistance - Patient 25 - 49%    Lower Body Dressing/Undressing Lower body dressing      What is the patient wearing?: Pants, Underwear/pull up     Lower body assist Assist for lower body dressing: Total  Assistance - Patient < 25%     Toileting Toileting    Toileting assist Assist for toileting: Dependent - Patient 0%     Transfers Chair/bed transfer  Transfers assist     Chair/bed transfer assist level: Dependent - mechanical lift  Locomotion Ambulation   Ambulation assist   Ambulation activity did not occur: Safety/medical concerns  Assist level: 2 helpers Assistive device: Parallel bars Max distance: 5'   Walk 10 feet activity   Assist  Walk 10 feet activity did not occur: Safety/medical concerns        Walk 50 feet activity   Assist Walk 50 feet with 2 turns activity did not occur: Safety/medical concerns         Walk 150 feet activity   Assist Walk 150 feet activity did not occur: Safety/medical concerns         Walk 10 feet on uneven surface  activity   Assist Walk 10 feet on uneven surfaces activity did not occur: Safety/medical concerns         Wheelchair     Assist Will patient use wheelchair at discharge?: Yes Type of Wheelchair: Manual    Wheelchair assist level: Supervision/Verbal cueing Max wheelchair distance: 66'    Wheelchair 50 feet with 2 turns activity    Assist        Assist Level: Supervision/Verbal cueing   Wheelchair 150 feet activity     Assist      Assist Level: Supervision/Verbal cueing   Blood pressure 105/64, pulse 81, temperature 98.6 F (37 C), temperature source Oral, resp. rate 16, height 5\' 7"  (1.702 m), weight 95.7 kg, SpO2 97 %.    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   4/30- MRI Shows disc protrusion with cord flattening at T11/12 and L3-L5 severe spinal stenosis- concerning at all 3 levels  -Have called [facility] consult to see if they can help/do surgery             -Continue CIR PT, OT -patient may shower -ELOS/Goals: 14-18 days, supervision to min assist with PT,OT 2. Antithrombotics: -DVT/anticoagulation:Lovenox.   4/12- Dopplers (-)- con't lovenox -antiplatelet therapy: Aspirin 81 mg daily 3. 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  4/26- MRI hopefully today  4/27- will change baclofen to 15 mg TID- with meals and 20 mg QHS  4/28- worked somewhat better last night 4. Mood:Provide emotional support -antipsychotic agents: N/A 5. Neuropsych: This patientiscapable of making decisions on hisown behalf. 6. Skin/Wound Care:place steristrips over right index finger lac.                          -continue wet to  dry to chronic left lateral malleolus wound (stage -unstagable) 80% sloigh per nursing note.[name]  4/14- pt L lateral malleolus healing- con't wound care; con't PRAFOs to get pressure off it.    4/15- ordered new PRAFOs to keep feet from turning out/rotating out  4/16- PRAFOs came with kickstands                         -foam dressing left knee 7. Fluids/Electrolytes/Nutrition:Routine in and outs with follow-up chemistries. Diet changed to regular  as per patient's preference. He states he would not follow current diet at home as is unappealing to him. I discussed that our goal is to simulate home environment as medications are adjusted according to current labs/vitals. I discussed that goal of heart healthy diet is to decrease sodium and saturated fat to protect the heart and circulatory system. Discussed his current diet and advised that he cut back on sugary juices, which he likes. Recommended replacing with water. He asked about his [employer] which he and his wife drink a lot of at home. Advised that despite it having no calories, it has a lot of processed ingredients and his goal should be to minimize processed foods. He expressed understanding. Continue nutritional counseling.   4/19: Electrolytes stable on BMP today. Calcium low at 8.7. Will start calcium supplement.  8. Chronic restless leg syndrome. Sinemet 10-100 mg twice daily. 9. 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 OSA with CPAP/2 L chronic oxygen at home. Continue inhalers  4/23- checked with resp therapy and verified pt hasn't been receiving CPAP at night most nights- she will "work on it".  11. 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 Follow-up outpatient 12. Diabetes mellitus. Hemoglobin A1c 5.8. SSI. Changed to [facility] HS for CBG and SSI  4/17- pt refused to get BGs checked- stopped 4/12- A1c 5.8- 13. Neurogenic bowel and bladder. Check PVR  Last BM 4/24  MIralax and sorbitol, incont   4/27- Bowel incontinent- LBM 4pm yesterday afternoon and smear overnight.  4/28- incontinent BMs- daily.   14. Hypothyroidism. Synthroid 15. Chronic diastolic congestive heart failure. Monitor for any signs of fluid overload. Check regular weights   Filed Weights   10/16/19 0541 10/16/19 1334 10/17/19 0642  Weight: 97.7 kg 97.7 kg 95.7 kg   Weight in  range from 95-97 kg- no SOB or increased edema   4/27- Weights are stable- con't regimen  4/30- Weight down 1.5 kg to 95.5 kg 16. Hyperlipidemia. Zocor 17. Code status: Changed to DNR as per patient's wishes. 18. Leukocytosis - pneumonia-   4/12- on prednisone taper- the cause since afebrile and no Sx's of illness-con't steroid taper  4/20- WBC back up to 14.7- off prednisone-Tm 99.9 yesterday- will check U/A, U Cx,  and CXR  4/21-  U/A somewhat (+) and CXR also shows likely pneumonia- and (+) UTI as well- large leuk- negative nitrites- will start Vanc and Cefepime per pharmacy-  And ordered labs in AM  4/22- WBC went up to 18.6k- back down to 15.3 k- fever broke yesterday- feeling a little better- will check labs in AM for trend and then not over weekend, unless needed  4/23- WBC 10. 3k- better- con't Vanc/Cefepime, MRSA + UCx contaminants, BC x1 neg, day 4 abx  4/26- Still on O2 but sounds better- con't ABX  4/27- off O2- off ABX as of  today.   19. Wheezing  Resolved on abx, scheduled umeclidinium  4/28- pulmonary saw pt- maximized meds  20 Spasticity vs tone in hamstrings, chronically WC bound may be contracted  21. Neurogenic bladder  4/28- NOW retaining urine- will start Flomax 0.4 mg daily and place foley per wishes of pt.   48. Dispo- pt wants ot leave, but don't have idea of why weaker yet- pt asking me to call wife.   4/29- when spoke to wife, she noted she cannot care for pt- cannot send him home "like this".    I spent a total of 30 minutes on care- discussing with NSU and pt about MRI results.    LOS: 21 days A FACE TO FACE EVALUATION WAS PERFORMED  Candy Leverett 10/17/2019, 9:03 AM

## 2019-10-17 NOTE — Consult Note (Signed)
NEUROLOGY CONSULT  Reason for Consult: leg weakness Referring Physician: Dr Zada Finders  CC: leg weakness/spasticity  HPI: [name] is an 79 y.o. 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 He has had those symptoms for years. He is followed by Dr. Brett Fairy of neurology services as well as neurosurgery. From what [name] describes, it sounds like he had improvement in function post-op in that he was able to stand/pivot and walk some with a walker and w/c following behind him. He then recently had worsening restless legs and weakness and was admitted to Rehab on 3/31 where he was improving till he had pneumonia. Since his pneumonia he has been generally weak and unable to stand.  CTH negative for acute changes.  Admission urine and blood cultures no growth.  MRI thoracic lumbar cervical spine showed chronic changes with patchy cord signal abnormality at the C3-4 level consistent with chronic myelomalacia.  Multilevel degenerative disc disease of the lower thoracic spine without high-grade spinal stenosis as well as lumbar spondylosis and congenitally short pedicles causing prominent impingement at L2-3 and 3-4 with moderate impingement L1-2 and 4-5 as well as T12-L1 and L5-S1.  Impingement of L1-2 and 2-3 minimally worsened from prior exam 07/24/2017.  Neurosurgery Dr. Kristeen Miss consulted in regards to MRI findings, but patient not felt to be a surgical candidate. He was treated with IV Solu-Medrol then transition to prolonged prednisone with  taper.  Echocardiogram completed during work-up shows ejection fraction of 60% no wall motion abnormalities.  Chronic pain control ongoing with the use of scheduled baclofen titrated to 15 mg 3 times daily as well as nightly Neurontin and OxyContin 10 mg every 12 hours.    Past Medical History Past Medical History:  Diagnosis Date  . Arthritis   . Barrett esophagus   . Bronchitis   . Carotid artery occlusion   . COPD (chronic obstructive pulmonary disease) ([place])   . Diabetes mellitus without complication ([place])   . GERD (gastroesophageal reflux disease)   . Headache    migraines  . History of thyroid cancer 2008  . Hypertension   . Restless leg   . Sleep apnea with use of continuous positive airway pressure (CPAP)    2011 [facility] , [facility]  77cn central and obstrcutive. 16 cm water , 3 cm EPR.   . Thoracic ascending aortic aneurysm ([facility])    4.2 cm ascending TAA 08/18/17 CTA. Annual imaging recommended.  . thyroid ca dx'd 2009 or 2010   surg and radioactive isoptope  . Ulcer    Peptic ulcer disease    Past Surgical History Past Surgical History:  Procedure Laterality Date  . ANTERIOR CERVICAL DECOMP/DISCECTOMY FUSION N/A 09/03/2017   Procedure: ACDF - C3-C4 - C4-C5 - C5-C6;  Surgeon: Kary Kos, MD;  Location: [place];  Service: Neurosurgery;  Laterality: N/A;  . CAROTID ENDARTERECTOMY Left January 03, 2006   Dr. Amedeo Plenty  . COLONOSCOPY    . EYE SURGERY Bilateral    cataract surgery with lens implants  . game keepers thumb Right   . JOINT REPLACEMENT Left    knee X 2  .  RADIOLOGY WITH ANESTHESIA N/A 10/16/2019   Procedure: MRI THORACIC LUMBAR SPINE;  Surgeon: Radiologist, Medication, MD;  Location: [place];  Service: Radiology;  Laterality: N/A;  . ROTATOR CUFF REPAIR Right   . THYROIDECTOMY  2008    Family History Family History  Problem Relation Age of Onset  . COPD Mother   . Hyperlipidemia Mother   . Hypertension Mother   . Diabetes Father   . Kidney disease Father         ESRD  . Hypertension Father   . Cancer Father   . Hyperlipidemia Father     Social History    reports that he quit smoking about 14 years ago. His smoking use included cigars and cigarettes. He quit after 40.00 years of use. He has never used smokeless tobacco. He reports that he does not drink alcohol or use drugs.  Allergies No Known Allergies  Home Medications Medications Prior to Admission  Medication Sig Dispense Refill  . acetaminophen (TYLENOL) 325 MG tablet Take 2 tablets (650 mg total) by mouth every 4 (four) hours as needed for mild pain ((score 1 to 3) or temp > 100.5). 40 tablet 0  . allopurinol (ZYLOPRIM) 300 MG tablet Take 300 mg by mouth daily.   12  . baclofen (LIORESAL) 10 MG tablet TAKE 1 TABLET BY MOUTH 3 TIMES DAILY. (Patient taking differently: Take 10 mg by mouth 3 (three) times daily. ) 270 tablet 0  . calcium citrate-vitamin D (CITRACAL+D) 315-200 MG-UNIT per tablet Take 1 tablet by mouth daily. 750 mg     . carbidopa-levodopa (SINEMET IR) 10-100 MG tablet Take 1 tablet by mouth 2 (two) times daily.     . cholecalciferol (VITAMIN D) 1000 units tablet Take 1,000 Units by mouth daily.     . colchicine 0.6 MG tablet Take 0.6 mg by mouth daily.    . furosemide (LASIX) 40 MG tablet Take 1 tablet (40 mg total) by mouth daily. 30 tablet 11  . gabapentin (NEURONTIN) 100 MG capsule Take 1 capsule (100 mg total) by mouth at bedtime. 30 capsule 1  . guaifenesin (HUMIBID E) 400 MG TABS tablet Take 400 mg by mouth daily.     [name] ipratropium-albuterol (DUONEB) 0.5-2.5 (3) MG/3ML SOLN Take 3 mLs by nebulization every 6 (six) hours as needed (cough).    . levETIRAcetam (KEPPRA) 250 MG tablet Take 250 mg by mouth 2 (two) times daily.    [name] levothyroxine (SYNTHROID) 175 MCG tablet Take 125 mcg by mouth daily before breakfast.     . metFORMIN (GLUCOPHAGE-XR) 500 MG 24 hr tablet Take 500 mg by mouth 2 (two) times daily.     . metoprolol succinate (TOPROL-XL) 50 MG 24 hr tablet Take 1  tablet (50 mg total) by mouth daily. Take with or immediately following a meal. 90 tablet 3  . Multiple Vitamin (MULTIVITAMIN) tablet Take 1 tablet by mouth daily.    . nitroGLYCERIN (NITROSTAT) 0.4 MG SL tablet Place 0.4 mg every 5 (five) minutes as needed under the tongue for chest pain.    . [EXPIRED] oxyCODONE (OXYCONTIN) 10 mg 12 hr tablet Take 1 tablet (10 mg total) by mouth every 12 (twelve) hours for 5 days. 10 tablet 0  . oxymetazoline (AFRIN) 0.05 % nasal spray Place 2 sprays into the nose 2 (two) times daily as needed for congestion.     . pantoprazole (PROTONIX) 40 MG tablet Take 40 mg by mouth every evening.    . polyethylene glycol powder (MIRALAX)  17 GM/SCOOP powder Take by mouth as directed. 17 gm every other day     . [EXPIRED] predniSONE (DELTASONE) 10 MG tablet Take 4 tablets (40 mg total) by mouth daily for 3 days, THEN 3 tablets (30 mg total) daily for 3 days, THEN 2 tablets (20 mg total) daily for 3 days, THEN 1 tablet (10 mg total) daily for 3 days. 30 tablet 0  . rizatriptan (MAXALT) 10 MG tablet Take 10 mg by mouth as needed for migraine.     . simvastatin (ZOCOR) 40 MG tablet Take 40 mg by mouth daily at 6 PM.   0  . spironolactone (ALDACTONE) 25 MG tablet Take 25 mg by mouth daily.     . TRAVATAN Z 0.004 % SOLN ophthalmic solution Place 1 drop into both eyes at bedtime.     . TRELEGY ELLIPTA 100-62.5-25 MCG/INH AEPB Inhale 1 puff into the lungs daily.   4  . triamcinolone cream (KENALOG) 0.5 % Apply 1 application topically daily as needed for rash.    . VENTOLIN HFA 108 (90 Base) MCG/ACT inhaler Inhale 1-2 puffs into the lungs every 4 (four) hours as needed for wheezing or shortness of breath.       Hospital Medications . allopurinol  300 mg Oral Daily  . aspirin EC  81 mg Oral Daily  . baclofen  15 mg Oral TID with meals  . baclofen  20 mg Oral QHS  . bisacodyl  10 mg Rectal Once  . calcium citrate  200 mg of elemental calcium Oral Daily  . carbidopa-levodopa  1  tablet Oral BID  . Chlorhexidine Gluconate Cloth  6 each Topical Q0600  . colchicine  0.6 mg Oral Daily  . enoxaparin (LOVENOX) injection  40 mg Subcutaneous Daily  . fluticasone furoate-vilanterol  1 puff Inhalation Daily  . gabapentin  100 mg Oral QHS  . guaiFENesin  1,200 mg Oral BID  . latanoprost  1 drop Both Eyes QHS  . levETIRAcetam  250 mg Oral BID  . levothyroxine  125 mcg Oral QAC breakfast  . methocarbamol  500 mg Oral Once  . multivitamin with minerals  1 tablet Oral Daily  . oxyCODONE  10 mg Oral Q12H  . pantoprazole  40 mg Oral QPM  . polyethylene glycol  17 g Oral BID  . simvastatin  40 mg Oral q1800  . sodium chloride flush  10-40 mL Intracatheter Q12H  . tamsulosin  0.4 mg Oral QPC supper  . tiZANidine  2 mg Oral TID  . umeclidinium bromide  1 puff Inhalation Daily     ROS: History obtained from pt  General ROS: negative for - chills, fatigue, fever, night sweats, weight gain or weight loss Psychological ROS: negative for - behavioral disorder, hallucinations, memory difficulties, mood swings or suicidal ideation Ophthalmic ROS: negative for - blurry vision, double vision, eye pain or loss of vision ENT ROS: negative for - epistaxis, nasal discharge, oral lesions, sore throat, tinnitus or vertigo Allergy and Immunology ROS: negative for - hives or itchy/watery eyes Hematological and Lymphatic ROS: negative for - bleeding problems, bruising or swollen lymph nodes Endocrine ROS: negative for - galactorrhea, hair pattern changes, polydipsia/polyuria or temperature intolerance Respiratory ROS: negative for - cough, hemoptysis, shortness of breath or wheezing Cardiovascular ROS: negative for - chest pain, dyspnea on exertion, edema or irregular heartbeat Gastrointestinal ROS: negative for - abdominal pain, diarrhea, hematemesis, nausea/vomiting or stool incontinence Genito-Urinary ROS: negative for - dysuria, hematuria, incontinence or urinary  frequency/urgency Musculoskeletal ROS: negative for - joint swelling or muscular weakness Neurological ROS: as noted in HPI Dermatological ROS: negative for rash and skin lesion changes   Physical Examination: Pending Vitals:   10/17/19 0000 10/17/19 0430 10/17/19 0642 10/17/19 0819  BP:  105/64    Pulse: 89 81    Resp: 18 16    Temp:  98.6 F (37 C)    TempSrc:  Oral    SpO2: 92% 95%  97%  Weight:   95.7 kg   Height:        General -  Heart - Regular rate and rhythm - no murmer Lungs - Clear to auscultation Abdomen - Soft - non tender Extremities - Distal pulses intact - no edema Skin - Warm and dry  Neurologic Examination: Mental Status: Alert, oriented, thought content appropriate.  Speech fluent without evidence of aphasia.  Able to follow 3 step commands without difficulty. Cranial Nerves: II: visual fields grossly normal, pupils equal, round, reactive to light and accommodation III,IV, VI: ptosis not present, extraocular muscles extra-ocular motions intact bilaterally V,VII: smile symmetric, facial light touch sensation normal bilaterally VIII: hearing normal bilaterally IX,X: gag reflex present XI: trapezius strength/neck flexion strength normal bilaterally XII: tongue strength normal  Motor: Right : Upper extremity    Left:     Upper extremity 5/5 deltoid       5/5 deltoid 5/5 tricep      5/5 tricep 5/5 biceps      5/5 biceps  5/5wrist flexion     5/5 wrist flexion 5/5 wrist extension     5/5 wrist extension 5/5 hand grip      5/5 hand grip  Lower extremity     Lower extremity 4/5 hip flexor      4+/5 hip flexor 4+/5 hip adductors     4+/5 hip adductors 4/5 hip abductors     4+/5 hip abductors 4/5 quadricep      5/5 quadriceps  4/5 hamstrings     5/5 hamstrings 3/5 plantar flexion       5/5 plantar flexion 1/5 plantar extension     5/5 plantar extension Tone and bulk:normal tone throughout; no atrophy noted Sensory: Reduced sensation to temperature,  light touch in the right leg compared to the left leg.  Subjective sensation loss from T12 and below. Deep Tendon Reflexes:  Right: Upper Extremity   Left: Upper extremity   biceps (C-5 to C-6) 2/4   biceps (C-5 to C-6) 3/4 tricep (C7) 2/4    triceps (C7) 3/4 Brachioradialis (C6) 2/4  Brachioradialis (C6) 3/4  Lower Extremity Lower Extremity  quadriceps (L-2 to L-4) 2/4   quadriceps (L-2 to L-4) 0/4 Achilles (S1) 2/4   Achilles (S1) 2/4 Plantars: Right: downgoing   Left: downgoing Cerebellar: normal finger-to-nose, normal rapid alternating movements     LABORATORY STUDIES:  Basic Metabolic Panel: Recent Labs  Lab 10/13/19 0431  NA 142  K 3.7  CL 103  CO2 31  GLUCOSE 95  BUN 15  CREATININE 0.86  CALCIUM 8.5*    Liver Function Tests: No results for input(s): AST, ALT, ALKPHOS, BILITOT, PROT, ALBUMIN in the last 168 hours. No results for input(s): LIPASE, AMYLASE in the last 168 hours. No results for input(s): AMMONIA in the last 168 hours.  CBC: Recent Labs  Lab 10/13/19 0431  WBC 6.3  NEUTROABS 3.8  HGB 11.6*  HCT 36.4*  MCV 93.8  PLT 264    Cardiac Enzymes: No results for input(s):  CKTOTAL, CKMB, CKMBINDEX, TROPONINI in the last 168 hours.  BNP: Invalid input(s): POCBNP  CBG: Recent Labs  Lab 10/16/19 1345 10/16/19 1728  GLUCAP 104* 122*    Microbiology:   Coagulation Studies: No results for input(s): LABPROT, INR in the last 72 hours.  Urinalysis: No results for input(s): COLORURINE, LABSPEC, PHURINE, GLUCOSEU, HGBUR, BILIRUBINUR, KETONESUR, PROTEINUR, UROBILINOGEN, NITRITE, LEUKOCYTESUR in the last 168 hours.  Invalid input(s): APPERANCEUR  Lipid Panel:  No results found for: CHOL, TRIG, HDL, CHOLHDL, VLDL, LDLCALC  HgbA1C:  Lab Results  Component Value Date   HGBA1C 5.8 (H) 09/17/2019    Urine Drug Screen:  No results found for: LABOPIA, COCAINSCRNUR, LABBENZ, AMPHETMU, THCU, LABBARB   Alcohol Level:  No results for input(s):  ETH in the last 168 hours.  Miscellaneous labs:  EKG  EKG  IMAGING: MR THORACIC SPINE W WO CONTRAST  Result Date: 10/16/2019 CLINICAL DATA:  Acute neuro deficit. Spasticity in left leg weakness. EXAM: MRI THORACIC AND LUMBAR SPINE WITHOUT AND WITH CONTRAST TECHNIQUE: Multiplanar and multiecho pulse sequences of the thoracic and lumbar spine were obtained without and with intravenous contrast. CONTRAST:  9.48mL GADAVIST GADOBUTROL 1 MMOL/ML IV SOLN COMPARISON:  MRI thoracic spine 09/17/2019. MRI lumbar spine 09/18/2019 FINDINGS: MRI THORACIC SPINE FINDINGS Alignment:  Mild retro listhesis T1-2.  Remaining alignment normal. Image quality degraded by mild motion however the study is significantly better quality than the prior study. The patient was scanned under general anesthesia. Vertebrae: Mild chronic compression fracture of T6. No acute fracture or mass. No evidence of spinal infection or discitis. 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 There is moderate spinal stenosis at T9-10. No definite cord lesion. Abnormal signal in the lower thoracic cord on the prior study was felt to be artifact. Paraspinal and other soft tissues: Mild to moderate left effusion. Right lower lobe atelectasis or pneumonia. Disc levels: L1-2: Disc degeneration and spurring with mild spinal stenosis and moderate foraminal stenosis bilaterally T2-3: Negative T3-4: Small central disc protrusion. Bilateral facet hypertrophy and mild foraminal stenosis bilaterally T4-5: Mild disc degeneration with small central disc protrusion. Bilateral facet hypertrophy. Mild foraminal narrowing bilaterally T5-6: Small central disc protrusion with mild facet degeneration. No significant stenosis T6-7: Mild disc and facet degeneration without stenosis T7-8: Mild disc and facet degeneration. Small central disc protrusion. Negative for stenosis. T8-9: Small central disc protrusion with mild facet degeneration. Negative for spinal or  foraminal stenosis. T9-10: Disc degeneration with diffuse endplate spurring and bilateral facet hypertrophy. Mild to moderate spinal stenosis and mild foraminal stenosis bilaterally. T10-11: Mild disc and facet degeneration. Mild foraminal narrowing bilaterally T11-12: Right-sided disc protrusion with cord flattening on the right and mild spinal stenosis. Mild foraminal narrowing bilaterally. Bilateral facet degeneration T12-L1: Mild right-sided disc protrusion with mild right foraminal stenosis. MRI LUMBAR SPINE FINDINGS Segmentation:  Normal Alignment: Mild retrolisthesis L2-3 and L3-4. Mild anterolisthesis L4-5. Vertebrae: Negative for fracture or mass. Discogenic sclerosis and edema in the bone marrow at L3-4. Fatty changes in the bone marrow at L4-5. No evidence of spinal infection. Conus medullaris: Extends to the L1-2 level and appears normal. Paraspinal and other soft tissues: Negative for paraspinous mass adenopathy or fluid collection. Disc levels: L1-2: Broad-based central disc protrusion. Bilateral facet hypertrophy. Mild to moderate spinal stenosis. L2-3: Advanced disc degeneration with marked disc space narrowing and endplate spurring as well as endplate fatty changes on the left. Moderate facet hypertrophy. Mild to moderate spinal stenosis and moderate subarticular stenosis bilaterally. L3-4:  Advanced disc degeneration with diffuse endplate spurring and moderate facet hypertrophy. Severe spinal stenosis and severe subarticular stenosis bilaterally. Severe right foraminal stenosis. L4-5: Severe disc degeneration with diffuse endplate spurring. Severe facet degeneration. Mild spinal stenosis. Severe subarticular stenosis on the right and moderate subarticular stenosis on the left L5-S1: Disc degeneration with endplate spurring. Right foraminal disc protrusion with displacement of the right L5 nerve root lateral to the foramen, unchanged from the prior study. Marked facet degeneration bilaterally with  moderate subarticular stenosis bilaterally. IMPRESSION: 1. 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 No other cord lesion identified in the thoracic spine. Cord signal on the prior study felt to be due to motion artifact. 2. Multilevel degenerative change throughout the thoracic spine with disc and facet degeneration multiple levels. No cord compression. 3. 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 No interval change from prior study. Electronically Signed   By: Franchot Gallo M.D.   On: 10/16/2019 19:38   MR Lumbar Spine W Wo Contrast  Result Date: 10/16/2019 CLINICAL DATA:  Acute neuro deficit. Spasticity in left leg weakness. EXAM: MRI THORACIC AND LUMBAR SPINE WITHOUT AND WITH CONTRAST TECHNIQUE: Multiplanar and multiecho pulse sequences of the thoracic and lumbar spine were obtained without and with intravenous contrast. CONTRAST:  9.67mL GADAVIST GADOBUTROL 1 MMOL/ML IV SOLN COMPARISON:  MRI thoracic spine 09/17/2019. MRI lumbar spine 09/18/2019 FINDINGS: MRI THORACIC SPINE FINDINGS Alignment:  Mild retro listhesis T1-2.  Remaining alignment normal. Image quality degraded by mild motion however the study is significantly better quality than the prior study. The patient was scanned under general anesthesia. Vertebrae: Mild chronic compression fracture of T6. No acute fracture or mass. No evidence of spinal infection or discitis. 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 There is moderate spinal stenosis at T9-10. No definite cord lesion. Abnormal signal in the lower thoracic cord on the prior study was felt to be artifact. Paraspinal and other soft tissues: Mild to moderate left effusion. Right lower lobe atelectasis or pneumonia. Disc levels: L1-2: Disc degeneration and spurring with mild spinal stenosis and moderate foraminal stenosis bilaterally T2-3: Negative T3-4: Small central disc protrusion.  Bilateral facet hypertrophy and mild foraminal stenosis bilaterally T4-5: Mild disc degeneration with small central disc protrusion. Bilateral facet hypertrophy. Mild foraminal narrowing bilaterally T5-6: Small central disc protrusion with mild facet degeneration. No significant stenosis T6-7: Mild disc and facet degeneration without stenosis T7-8: Mild disc and facet degeneration. Small central disc protrusion. Negative for stenosis. T8-9: Small central disc protrusion with mild facet degeneration. Negative for spinal or foraminal stenosis. T9-10: Disc degeneration with diffuse endplate spurring and bilateral facet hypertrophy. Mild to moderate spinal stenosis and mild foraminal stenosis bilaterally. T10-11: Mild disc and facet degeneration. Mild foraminal narrowing bilaterally T11-12: Right-sided disc protrusion with cord flattening on the right and mild spinal stenosis. Mild foraminal narrowing bilaterally. Bilateral facet degeneration T12-L1: Mild right-sided disc protrusion with mild right foraminal stenosis. MRI LUMBAR SPINE FINDINGS Segmentation:  Normal Alignment: Mild retrolisthesis L2-3 and L3-4. Mild anterolisthesis L4-5. Vertebrae: Negative for fracture or mass. Discogenic sclerosis and edema in the bone marrow at L3-4. Fatty changes in the bone marrow at L4-5. No evidence of spinal infection. Conus medullaris: Extends to the L1-2 level and appears normal. Paraspinal and other soft tissues: Negative for paraspinous mass adenopathy or fluid collection. Disc levels: L1-2: Broad-based central disc protrusion. Bilateral facet hypertrophy. Mild to moderate spinal stenosis. L2-3:  Advanced disc degeneration with marked disc space narrowing and endplate spurring as well as endplate fatty changes on the left. Moderate facet hypertrophy. Mild to moderate spinal stenosis and moderate subarticular stenosis bilaterally. L3-4: Advanced disc degeneration with diffuse endplate spurring and moderate facet hypertrophy.  Severe spinal stenosis and severe subarticular stenosis bilaterally. Severe right foraminal stenosis. L4-5: Severe disc degeneration with diffuse endplate spurring. Severe facet degeneration. Mild spinal stenosis. Severe subarticular stenosis on the right and moderate subarticular stenosis on the left L5-S1: Disc degeneration with endplate spurring. Right foraminal disc protrusion with displacement of the right L5 nerve root lateral to the foramen, unchanged from the prior study. Marked facet degeneration bilaterally with moderate subarticular stenosis bilaterally. IMPRESSION: 1. 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 No other cord lesion identified in the thoracic spine. Cord signal on the prior study felt to be due to motion artifact. 2. Multilevel degenerative change throughout the thoracic spine with disc and facet degeneration multiple levels. No cord compression. 3. 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 No interval change from prior study. Electronically Signed   By: Franchot Gallo M.D.   On: 10/16/2019 19:38     Assessment/Plan: Mr Tipper is a 79yr old man who is debilitated at baseline, (mRS4) who mainly uses a wheelchair for mobility, but is able to use walker with assistance and w/c following behind. He has a chronic indwelling foley that he has had for many years and proceeds his. It seems that he has had some gradual decline over last year despite some initial improvements after the ACDF in 09/03/2017. Mr. Gange and the PT at bedside state since admission the pt did have steady improvements with max rehab efforts, but then declined sharply after he got pneumonia. He has been treated with abx, but remains on 02 [place]. He describes general weakness and fatigue. This is certainly understandable given the degree of his debilitated baseline complicated by pneumonia infection. The only new neurologic issue in  increase in leg spasmus, specifically the right leg.  # Chronic myelopathy and radiculopathy- s/p ACDF in 09/03/17 # Baseline physical debility and incontinence- w/c bound and chronic foley # Spasticity and severe muscle spasum- he is already on a good daily baclofen regiment. Perhaps some other rehab interventions? Or Botox? # RLS- continue on sinemet # Myoclonic jerking- continue on keppra # Pneumonia- remains on 02 [place], abx # Acute deconditioning- given poor baseline and neurologic reserve, any infection or illness can contribute to acute physical worsening.     Attending neurologist's note to follow  Desiree Metzger-Cihelka, ARNP-C, ANVP-BC Pager: [phone]   NEUROHOSPITALIST ADDENDUM Performed a face to face diagnostic evaluation.   I have reviewed the contents of history and physical exam as documented by PA/ARNP/Resident and agree with above documentation.  I have discussed and formulated the above plan as documented. Edits to the note have been made as needed.  Neurology consulted for worsening gait weakness, after patient admitted for pneumonia.  Patient at baseline is mostly wheelchair-bound, can take at best 100 steps with walker in rehab.  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  Reviewed note from 2019-right side at that time was weaker than the left.  With regards to sensory deficits patient states that he has been ongoing for approximately a year.  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  Patient also appears to have limitation in the movement of this right knee, has left knee replacement.  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  I do not have a great explanation for sensory changes-somewhat inconsistent.   MRI thoracic spine and L-spine would not suggest above myelopathy/transverse myelitis.  It is possible that patient may have right leg radiculopathy, but also has significant spasticity and possibly right knee osteoarthritis.  Recommendations -X-ray right knee to evaluate for osteoarthritis -Continue to address of spasticity -Outpatient EMG/nerve conduction study to evaluate for radiculopathy -Continue PT OT  Neurology follow-up as outpatient, with plan to do EMG/nerve conduction study      Danira Nylander MD [facility] [id number]   If 7pm to 7am, please call on call as listed on AMION.

## 2019-10-17 NOTE — Progress Notes (Signed)
Patient has a pressure injury on his nose. RT placed mepilex on the bridge of the nose

## 2019-10-17 NOTE — Progress Notes (Signed)
Recreational Therapy Session Note  Patient Details  Name: AUTREY FORTINO MRN: [id number] Date of Birth: 03-Mar-1941 Today's Date: 10/17/2019 Time:  1127-12 Pain: no c/o Skilled Therapeutic Interventions/Progress Updates: Session focused on activity tolerance, BUE strengthening and emotional support.  Pt propelled w/c ~25' at the beginning of the session and ~50' at end of the session with supervision using BUEs.  In the gym, pt sat w/c level for modified volleyball task using 1lb weighted dowel in BUEs to tap the ball back and forth with the therapist  Supervision level.  Pt tolerated 3 rounds of 20 taps before needing a rest break. During rest breaks discussed leisure interests and his hope to return to "roasting coffee" when back at home.  Pt expressed frustration/concern with continued hospital/rehab stay as well as new MRI results but also recognizes he can't go home like this.  Emotional support provided.  Pt returned to room with wife present.    Therapy/Group: Co-Treatment ~ [place] 10/17/2019, 12:19 PM

## 2019-10-17 NOTE — Plan of Care (Signed)
  Problem: Consults Goal: RH SPINAL CORD INJURY PATIENT EDUCATION Description:  See Patient Education module for education specifics.  Outcome: Progressing Goal: Skin Care Protocol Initiated - if Braden Score 18 or less Description: If consults are not indicated, leave blank or document N/A Outcome: Progressing   Problem: SCI BOWEL ELIMINATION Goal: RH STG MANAGE BOWEL WITH ASSISTANCE Description: STG Manage Bowel with min Assistance. Outcome: Progressing Goal: RH STG SCI MANAGE BOWEL PROGRAM W/ASSIST OR AS APPROPRIATE Description: STG SCI Manage bowel program w/ min assist or as appropriate. Outcome: Progressing   Problem: SCI BLADDER ELIMINATION Goal: RH STG MANAGE BLADDER WITH ASSISTANCE Description: STG Manage Bladder With min/mod Assistance Outcome: Progressing   Problem: RH SKIN INTEGRITY Goal: RH STG SKIN FREE OF INFECTION/BREAKDOWN Description: Patients skin will remain free from further infection or breakdown with mod assist. Outcome: Progressing Goal: RH STG MAINTAIN SKIN INTEGRITY WITH ASSISTANCE Description: STG Maintain Skin Integrity With mod Assistance. Outcome: Progressing Goal: RH STG ABLE TO PERFORM INCISION/WOUND CARE W/ASSISTANCE Description: STG Able To Perform Incision/Wound Care With total Assistance from caregiver. Outcome: Progressing   Problem: RH SAFETY Goal: RH STG ADHERE TO SAFETY PRECAUTIONS W/ASSISTANCE/DEVICE Description: STG Adhere to Safety Precautions With mod I Assistance/Device. Outcome: Progressing   Problem: RH PAIN MANAGEMENT Goal: RH STG PAIN MANAGED AT OR BELOW PT'S PAIN GOAL Description: < 4 Outcome: Progressing

## 2019-10-17 NOTE — Progress Notes (Signed)
Physical Therapy Session Note  Patient Details  Name: [name] MRN: [id number] Date of Birth: February 25, 1941  Today's Date: 10/17/2019 PT Individual Time: [id number] PT Individual Time Calculation (min): 70 min   Short Term Goals: Week 3:  PT Short Term Goal 1 (Week 3): =LTG due to ELOS PT Short Term Goal 1 - Progress (Week 3): Progressing toward goal  Skilled Therapeutic Interventions/Progress Updates:  Pt received sitting in w/c with his lunch in front of him and his wife present. Pt reports he is finished eating and agreeable to therapy session. Pt's wife reports that when he had pneumonia in the past he didn't run a fever or cough but rather he became very weak until it was more severe then he might have a fever. Pt received on 2L of O2 with SpO2 91% at rest and upon repositioning in the chair and placing stedy it decreased to 87% therefore increased to 3L of O2 prior to mobility tasks. Sit>stand w/c>stedy with +2 total assist for lifting into standing requiring 2 attempts prior to lifting hips enough to place stedy seat pads - pt's R LE flexor tone caused foot to draw up off of the foot board. Once in stedy seat MDs from Neurology department arrived. Performed x6 bouts of taking 1 hand off stedy at time to touch external targets within BOS focusing on maintaining trunk extension with intermittent min assist to reposition improved trunk upright. Sit on stedy seat>standing with heavy +2 total assist to clear pt's hips enough to flip up stedy seat pads and allow pt to return to sitting in w/c. During neurology MD assessment, therapist providing input on pt's CLOF versus LOF when this therapist evaluated patient indicating significant functional decline as well as increased tone in B LEs (R>L). Pt demonstrates significant R LE flexor tone in sitting with inability to straighten knee and when therapist provided assist for straightening it caused the tone to increase. L lateral scoot transfer to EOB using  transfer board with +2 mod assist for placing board, scooting hips, and trunk control  - pt states "I hate this" but educated on increased safety risk using transfer board as opposed to stedy at this time. Sitting EOB performed trunk control/sitting balance tasks focusing on maintaining midline with decreased UE support of reaching slightly outside BOS to touch external targets - with fatigue demonstrates worsening R lateral and posterior lean. Sit>supine via reverse logroll technique with max assist for trunk descent and B LE management into the bed. Pt left supine in bed with needs in reach and bed alarm on.    Primary PT, Lovena Le, notified of neurology consult and pt's LOF during session.  Therapy Documentation Precautions:  Precautions Precautions: Fall Restrictions Weight Bearing Restrictions: No  Pain:   Repeatedly denies pain during session stating "that's the thing I've not had any pain."   Therapy/Group: Individual Therapy  Tawana Scale, PT, DPT 10/17/2019, 10:44 AM

## 2019-10-17 NOTE — Progress Notes (Signed)
Occupational Therapy Session Note  Patient Details  Name: [name] MRN: [id number] Date of Birth: Mar 30, 1941  Today's Date: 10/17/2019 OT Individual Time: 9024-0973 OT Individual Time Calculation (min): 74 min    Short Term Goals: Week 3:  OT Short Term Goal 1 (Week 3): STG=LTG secondary to ELOS  Skilled Therapeutic Interventions/Progress Updates:  Patient met lying supine in agreement with OT treatment session with focus on bed mobility, self-care re-education, and functional transfers as detailed below. Patient reporting presence of intermittent muscle spasms in LLE every ~5-8 minutes. Patient appeared very lethargic with inability to keep eyes open. OT noticed that patient wasn't on O2 with orders for 2L via [place]. Patient notes removal of CPAP this A.M. ~1 hour prior to OT's entrance with no O2 placed after removal. Patient O2 saturation 61% on room air. RN notified and patient placed on 2.5L via [place] with SpO2 returning to 89-91% after 3-4 minutes. OT proceeded with therapy per POC. Patient able to wash face with set-up assist. UB dressing in supine with Max A to doff hospital gown and don T-shirt with increased time/effort. Patient incontinent of bowel requiring Max A +2 for hygiene/clothing management with assistance to attain and maintain L side-lying position. LB dressing rolling R<>L with Max A and use of draw sheet. Sit to stand and stand to sit in wc with use of Stedy and Max A +2. Session concluded with patient seated in wc with call bell within reach and all needs met.   Therapy Documentation Precautions:  Precautions Precautions: Fall Restrictions Weight Bearing Restrictions: No  Therapy/Group: Individual Therapy  Sabella Traore R [place] 10/17/2019, 7:53 AM

## 2019-10-17 NOTE — Progress Notes (Signed)
Physical Therapy Weekly Progress Note  Patient Details  Name: [name] MRN: [id number] Date of Birth: 02-24-1941  Beginning of progress report period: October 10, 2019 End of progress report period: October 17, 2019  Today's Date: 10/17/2019 PT Individual Time: 1100-1200 PT Individual Time Calculation (min): 60 min   Patient has met 0 of 1 short term goals.  STG were set to LTG due to ELOS as pt's anticipated d/c date was set to 10/19/2019. Over the past week pt has continued to exhibit an overall decline in function requiring max A +2 for bed mobility, max to total A of +2 to +3 to stand to the stedy, and has only been able to propel the manual w/c x 25-50 ft with use of BUE at Supervision level before onset of fatigue. Pt has exhibited decreased overall strength, endurance, and tolerance for therapy due to onset of pneumonia as well as increase in BLE weakness and spasms. Medical team is addressing these issues as they are able to. Pt remains motivated to return to PLOF but is limited by his endurance, strength, and ongoing spasticity. Pt's LOS has been extended due to these ongoing medical issues.  Patient continues to demonstrate the following deficits muscle weakness and muscle joint tightness, decreased cardiorespiratoy endurance and decreased oxygen support, abnormal tone and decreased sitting balance, decreased standing balance, decreased postural control and decreased balance strategies and therefore will continue to benefit from skilled PT intervention to increase functional independence with mobility.  Patient progressing toward long term goals..  Continue plan of care.  PT Short Term Goals Week 3:  PT Short Term Goal 1 (Week 3): =LTG due to ELOS PT Short Term Goal 1 - Progress (Week 3): Progressing toward goal Week 4:  PT Short Term Goal 1 (Week 4): Pt will complete bed mobility with assist x 1 PT Short Term Goal 2 (Week 4): Pt will complete least restrictive transfer with assist x  1 PT Short Term Goal 3 (Week 4): Pt will perform w/c mobility x 100 ft at Supervision level  Skilled Therapeutic Interventions/Progress Updates:    Pt received seated in w/c in room, agreeable to PT session. No complaints of pain. Pt on 2L O2 at rest, SpO2 remains at 95% and above throughout session while on 2L even with activity. Manual w/c propulsion x 25 ft, x 50 ft at Supervision level with use of BUE before onset of fatigue. Seated volleyball with use of 1# dowel rod in BUE for endurance training, 3 x 20 reps to fatigue. Pt left seated in w/c in room with needs in reach, wife present at end of session. Cotreatment session with RT.  Therapy Documentation Precautions:  Precautions Precautions: Fall Restrictions Weight Bearing Restrictions: No   Therapy/Group: Individual Therapy   Excell Seltzer, PT, DPT  10/17/2019, 12:50 PM

## 2019-10-18 DIAGNOSIS — Z9981 Dependence on supplemental oxygen: Secondary | ICD-10-CM

## 2019-10-18 DIAGNOSIS — G992 Myelopathy in diseases classified elsewhere: Secondary | ICD-10-CM

## 2019-10-18 DIAGNOSIS — I5032 Chronic diastolic (congestive) heart failure: Secondary | ICD-10-CM

## 2019-10-18 DIAGNOSIS — N319 Neuromuscular dysfunction of bladder, unspecified: Secondary | ICD-10-CM

## 2019-10-18 DIAGNOSIS — M48061 Spinal stenosis, lumbar region without neurogenic claudication: Secondary | ICD-10-CM

## 2019-10-18 DIAGNOSIS — R0902 Hypoxemia: Secondary | ICD-10-CM

## 2019-10-18 DIAGNOSIS — G8194 Hemiplegia, unspecified affecting left nondominant side: Secondary | ICD-10-CM

## 2019-10-18 DIAGNOSIS — M4802 Spinal stenosis, cervical region: Secondary | ICD-10-CM

## 2019-10-18 DIAGNOSIS — K592 Neurogenic bowel, not elsewhere classified: Secondary | ICD-10-CM

## 2019-10-18 DIAGNOSIS — G959 Disease of spinal cord, unspecified: Secondary | ICD-10-CM

## 2019-10-18 LAB — GLUCOSE, CAPILLARY: Glucose-Capillary: 99 mg/dL (ref 70–99)

## 2019-10-18 MED ORDER — BACLOFEN 20 MG PO TABS
30.0000 mg | ORAL_TABLET | Freq: Every day | ORAL | Status: DC
  Administered 2019-10-18 – 2019-10-22 (×5): 30 mg via ORAL
  Filled 2019-10-18 (×5): qty 1

## 2019-10-18 NOTE — Progress Notes (Signed)
Neuro recs reviewed, agree with plan of care - have him get an EMG/NCV and if he is not improving after recovering, will pursue further workup. Pt should follow up with me as an outpatient after the electrodiagnostic testing.

## 2019-10-18 NOTE — Plan of Care (Signed)
  Problem: SCI BLADDER ELIMINATION Goal: RH STG MANAGE BLADDER WITH ASSISTANCE Description: STG Manage Bladder With min/mod Assistance Outcome: Not Progressing; foley cath

## 2019-10-18 NOTE — Progress Notes (Signed)
Patient on CPAP

## 2019-10-18 NOTE — Progress Notes (Signed)
[facility] PHYSICAL MEDICINE & REHABILITATION PROGRESS NOTE    Subjective: Patient seen sitting up in bed this morning.  He states he did not sleep well overnight due to leg cramps and spasms.  He is questions regarding neurosurgery and neurology consult yesterday.  Notes and imaging personally reviewed and with patient.  ROS: +Leg spasms.  Denies CP, SOB, N/V/D  Objective:   MR THORACIC SPINE W WO CONTRAST  Result Date: 10/16/2019 CLINICAL DATA:  Acute neuro deficit. Spasticity in left leg weakness. EXAM: MRI THORACIC AND LUMBAR SPINE WITHOUT AND WITH CONTRAST TECHNIQUE: Multiplanar and multiecho pulse sequences of the thoracic and lumbar spine were obtained without and with intravenous contrast. CONTRAST:  9.59mL GADAVIST GADOBUTROL 1 MMOL/ML IV SOLN COMPARISON:  MRI thoracic spine 09/17/2019. MRI lumbar spine 09/18/2019 FINDINGS: MRI THORACIC SPINE FINDINGS Alignment:  Mild retro listhesis T1-2.  Remaining alignment normal. Image quality degraded by mild motion however the study is significantly better quality than the prior study. The patient was scanned under general anesthesia. Vertebrae: Mild chronic compression fracture of T6. No acute fracture or mass. No evidence of spinal infection or discitis. 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 There is moderate spinal stenosis at T9-10. No definite cord lesion. Abnormal signal in the lower thoracic cord on the prior study was felt to be artifact. Paraspinal and other soft tissues: Mild to moderate left effusion. Right lower lobe atelectasis or pneumonia. Disc levels: L1-2: Disc degeneration and spurring with mild spinal stenosis and moderate foraminal stenosis bilaterally T2-3: Negative T3-4: Small central disc protrusion. Bilateral facet hypertrophy and mild foraminal stenosis bilaterally T4-5: Mild disc degeneration with small central disc protrusion. Bilateral facet hypertrophy. Mild foraminal narrowing bilaterally T5-6: Small  central disc protrusion with mild facet degeneration. No significant stenosis T6-7: Mild disc and facet degeneration without stenosis T7-8: Mild disc and facet degeneration. Small central disc protrusion. Negative for stenosis. T8-9: Small central disc protrusion with mild facet degeneration. Negative for spinal or foraminal stenosis. T9-10: Disc degeneration with diffuse endplate spurring and bilateral facet hypertrophy. Mild to moderate spinal stenosis and mild foraminal stenosis bilaterally. T10-11: Mild disc and facet degeneration. Mild foraminal narrowing bilaterally T11-12: Right-sided disc protrusion with cord flattening on the right and mild spinal stenosis. Mild foraminal narrowing bilaterally. Bilateral facet degeneration T12-L1: Mild right-sided disc protrusion with mild right foraminal stenosis. MRI LUMBAR SPINE FINDINGS Segmentation:  Normal Alignment: Mild retrolisthesis L2-3 and L3-4. Mild anterolisthesis L4-5. Vertebrae: Negative for fracture or mass. Discogenic sclerosis and edema in the bone marrow at L3-4. Fatty changes in the bone marrow at L4-5. No evidence of spinal infection. Conus medullaris: Extends to the L1-2 level and appears normal. Paraspinal and other soft tissues: Negative for paraspinous mass adenopathy or fluid collection. Disc levels: L1-2: Broad-based central disc protrusion. Bilateral facet hypertrophy. Mild to moderate spinal stenosis. L2-3: Advanced disc degeneration with marked disc space narrowing and endplate spurring as well as endplate fatty changes on the left. Moderate facet hypertrophy. Mild to moderate spinal stenosis and moderate subarticular stenosis bilaterally. L3-4: Advanced disc degeneration with diffuse endplate spurring and moderate facet hypertrophy. Severe spinal stenosis and severe subarticular stenosis bilaterally. Severe right foraminal stenosis. L4-5: Severe disc degeneration with diffuse endplate spurring. Severe facet degeneration. Mild spinal stenosis.  Severe subarticular stenosis on the right and moderate subarticular stenosis on the left L5-S1: Disc degeneration with endplate spurring. Right foraminal disc protrusion with displacement of the right L5 nerve root lateral to the foramen, unchanged from the prior  study. Marked facet degeneration bilaterally with moderate subarticular stenosis bilaterally. IMPRESSION: 1. 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 No other cord lesion identified in the thoracic spine. Cord signal on the prior study felt to be due to motion artifact. 2. Multilevel degenerative change throughout the thoracic spine with disc and facet degeneration multiple levels. No cord compression. 3. 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 No interval change from prior study. Electronically Signed   By: Franchot Gallo M.D.   On: 10/16/2019 19:38   MR Lumbar Spine W Wo Contrast  Result Date: 10/16/2019 CLINICAL DATA:  Acute neuro deficit. Spasticity in left leg weakness. EXAM: MRI THORACIC AND LUMBAR SPINE WITHOUT AND WITH CONTRAST TECHNIQUE: Multiplanar and multiecho pulse sequences of the thoracic and lumbar spine were obtained without and with intravenous contrast. CONTRAST:  9.35mL GADAVIST GADOBUTROL 1 MMOL/ML IV SOLN COMPARISON:  MRI thoracic spine 09/17/2019. MRI lumbar spine 09/18/2019 FINDINGS: MRI THORACIC SPINE FINDINGS Alignment:  Mild retro listhesis T1-2.  Remaining alignment normal. Image quality degraded by mild motion however the study is significantly better quality than the prior study. The patient was scanned under general anesthesia. Vertebrae: Mild chronic compression fracture of T6. No acute fracture or mass. No evidence of spinal infection or discitis. 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 There is moderate spinal stenosis at T9-10. No definite cord lesion. Abnormal signal in the lower thoracic cord on the  prior study was felt to be artifact. Paraspinal and other soft tissues: Mild to moderate left effusion. Right lower lobe atelectasis or pneumonia. Disc levels: L1-2: Disc degeneration and spurring with mild spinal stenosis and moderate foraminal stenosis bilaterally T2-3: Negative T3-4: Small central disc protrusion. Bilateral facet hypertrophy and mild foraminal stenosis bilaterally T4-5: Mild disc degeneration with small central disc protrusion. Bilateral facet hypertrophy. Mild foraminal narrowing bilaterally T5-6: Small central disc protrusion with mild facet degeneration. No significant stenosis T6-7: Mild disc and facet degeneration without stenosis T7-8: Mild disc and facet degeneration. Small central disc protrusion. Negative for stenosis. T8-9: Small central disc protrusion with mild facet degeneration. Negative for spinal or foraminal stenosis. T9-10: Disc degeneration with diffuse endplate spurring and bilateral facet hypertrophy. Mild to moderate spinal stenosis and mild foraminal stenosis bilaterally. T10-11: Mild disc and facet degeneration. Mild foraminal narrowing bilaterally T11-12: Right-sided disc protrusion with cord flattening on the right and mild spinal stenosis. Mild foraminal narrowing bilaterally. Bilateral facet degeneration T12-L1: Mild right-sided disc protrusion with mild right foraminal stenosis. MRI LUMBAR SPINE FINDINGS Segmentation:  Normal Alignment: Mild retrolisthesis L2-3 and L3-4. Mild anterolisthesis L4-5. Vertebrae: Negative for fracture or mass. Discogenic sclerosis and edema in the bone marrow at L3-4. Fatty changes in the bone marrow at L4-5. No evidence of spinal infection. Conus medullaris: Extends to the L1-2 level and appears normal. Paraspinal and other soft tissues: Negative for paraspinous mass adenopathy or fluid collection. Disc levels: L1-2: Broad-based central disc protrusion. Bilateral facet hypertrophy. Mild to moderate spinal stenosis. L2-3: Advanced disc  degeneration with marked disc space narrowing and endplate spurring as well as endplate fatty changes on the left. Moderate facet hypertrophy. Mild to moderate spinal stenosis and moderate subarticular stenosis bilaterally. L3-4: Advanced disc degeneration with diffuse endplate spurring and moderate facet hypertrophy. Severe spinal stenosis and severe subarticular stenosis bilaterally. Severe right foraminal stenosis. L4-5: Severe disc degeneration with diffuse endplate spurring. Severe facet degeneration. Mild spinal stenosis. Severe subarticular stenosis on the  right and moderate subarticular stenosis on the left L5-S1: Disc degeneration with endplate spurring. Right foraminal disc protrusion with displacement of the right L5 nerve root lateral to the foramen, unchanged from the prior study. Marked facet degeneration bilaterally with moderate subarticular stenosis bilaterally. IMPRESSION: 1. 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 No other cord lesion identified in the thoracic spine. Cord signal on the prior study felt to be due to motion artifact. 2. Multilevel degenerative change throughout the thoracic spine with disc and facet degeneration multiple levels. No cord compression. 3. 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 No interval change from prior study. Electronically Signed   By: Franchot Gallo M.D.   On: 10/16/2019 19:38   No results for input(s): WBC, HGB, HCT, PLT in the last 72 hours. No results for input(s): NA, K, CL, CO2, GLUCOSE, BUN, CREATININE, CALCIUM in the last 72 hours.  Intake/Output Summary (Last 24 hours) at 10/18/2019 1009 Last data filed at 10/18/2019 0500 Gross per 24 hour  Intake 772 ml  Output 1325 ml  Net -553 ml     Physical Exam: Vital Signs Blood pressure (!) 155/76, pulse 80, temperature 98.9 F (37.2 C), resp. rate 18, height 5\' 7"  (1.702 m), weight 95.5 kg, SpO2 94  %. Constitutional: No distress . Vital signs reviewed. HENT: Normocephalic.  Atraumatic. Eyes: EOMI. No discharge. Cardiovascular: No JVD. Respiratory: Normal effort.  No stridor.  + [place]. GI: Non-distended. Skin: Warm and dry.  Intact. Psych: Normal mood.  Normal behavior. Musc: No edema in extremities.  No tenderness in extremities. Neurologic: Alert Motor:  Left lower extremity: 2/5 proximal distal Right lower extremity: 1/5 proximal distal  Assessment/Plan: 1. Functional deficits secondary to lumbar spinal stenosis with neurogenic claudication which require 3+ hours per day of interdisciplinary therapy in a comprehensive inpatient rehab setting.  Physiatrist is providing close team supervision and 24 hour management of active medical problems listed below.  Physiatrist and rehab team continue to assess barriers to discharge/monitor patient progress toward functional and medical goals  Care Tool:  Bathing    Body parts bathed by patient: Right arm, Left arm, Chest, Abdomen, Front perineal area, Right upper leg, Left upper leg, Right lower leg, Left lower leg, Face   Body parts bathed by helper: Buttocks     Bathing assist Assist Level: Minimal Assistance - Patient > 75%     Upper Body Dressing/Undressing Upper body dressing   What is the patient wearing?: Pull over shirt    Upper body assist Assist Level: Maximal Assistance - Patient 25 - 49%    Lower Body Dressing/Undressing Lower body dressing      What is the patient wearing?: Pants, Underwear/pull up     Lower body assist Assist for lower body dressing: Total Assistance - Patient < 25%     Toileting Toileting    Toileting assist Assist for toileting: Dependent - Patient 0%     Transfers Chair/bed transfer  Transfers assist     Chair/bed transfer assist level: 2 Helpers(slide board)     Locomotion Ambulation   Ambulation assist   Ambulation activity did not occur: Safety/medical  concerns  Assist level: 2 helpers Assistive device: Parallel bars Max distance: 5'   Walk 10 feet activity   Assist  Walk 10 feet activity did not occur: Safety/medical concerns        Walk 50 feet activity   Assist Walk 50 feet with  2 turns activity did not occur: Safety/medical concerns         Walk 150 feet activity   Assist Walk 150 feet activity did not occur: Safety/medical concerns         Walk 10 feet on uneven surface  activity   Assist Walk 10 feet on uneven surfaces activity did not occur: Safety/medical concerns         Wheelchair     Assist Will patient use wheelchair at discharge?: Yes Type of Wheelchair: Manual    Wheelchair assist level: Supervision/Verbal cueing Max wheelchair distance: 73'    Wheelchair 50 feet with 2 turns activity    Assist        Assist Level: Supervision/Verbal cueing   Wheelchair 150 feet activity     Assist      Assist Level: Supervision/Verbal cueing   Blood pressure (!) 155/76, pulse 80, temperature 98.9 F (37.2 C), resp. rate 18, height 5\' 7"  (1.702 m), weight 95.5 kg, SpO2 94 %.    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 in addition to his low back problems             -MRI 4/1 severe central stenosis L2-3 conus ends at L1, MRI T spine without signs of myelopathy  MRI on 4/29 personally reviewed, showing spinal stenosis T9-10 with small syrinx, multilevel DDD, disc protrusion with cord flattening at T11/12 and L3-L5 severe spinal stenosis  Neurosurgery consulted, appreciate recs, did not feel that lumbar disease because of progression and recommended neurology consult, possible consideration of spinal angiogram for evaluation for fistula  Neurology  consulted recommended management of spasticity and spasms with outpatient NCS/C EMG  Continue CIR 2. Antithrombotics: -DVT/anticoagulation:Lovenox.   4/12- Dopplers (-)- con't lovenox -antiplatelet therapy: Aspirin 81 mg daily 3. Pain Management:Neurontin 100 mg nightly, OxyContin 10 mg every 12 hours, hydrocodone as needed as well as Imitrex for headaches  baclofen to 15 mg TID- with meals and 20 mg QHS, increase nighttime dose to 30mg  on 5/1  Tizanidine 2 mg 3 times daily 4. Mood:Provide emotional support -antipsychotic agents: N/A 5. Neuropsych: This patientiscapable of making decisions on hisown behalf. 6. Skin/Wound Care:place steristrips over right index finger lac.                          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 Fluids/Electrolytes/Nutrition:Routine in and outs  Calcium 8.5 on 4/26-started calcium supplement, labs ordered for Monday 8. Chronic restless leg syndrome. Sinemet 10-100 mg twice daily.  See #3 9. 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 OSA with CPAP/2 L chronic oxygen at home. Continue inhalers 11. 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 Follow-up outpatient 12. Diabetes mellitus. Hemoglobin A1c 5.8. SSI. Changed to [facility] for CBG and SSI  HbA1c 5.8- 13. Neurogenic bowel and bladder. Check PVR  Incontinent BMs 14. Hypothyroidism.  Continue Synthroid 15. Chronic diastolic congestive heart failure. Monitor for any signs of fluid overload. Check regular weights  Filed Weights   10/16/19 1334 10/17/19 0642 10/18/19 0507  Weight: 97.7 kg 95.7 kg 95.5 kg   Stable on 5/1 16. Hyperlipidemia. Zocor 17. Code status: Changed to  DNR as per patient's wishes. 18. Leukocytosis - pneumonia- Resolved 19. Wheezing  Resolved on abx, scheduled umeclidinium  4/28- pulmonary saw pt- maximized meds 20 Spasticity vs tone in hamstrings, chronically WC bound may be contracted 21. Neurogenic bladder  4/28-started Flomax 0.4 mg daily and place foley per wishes of pt.   LOS: 22 days A FACE TO FACE EVALUATION WAS PERFORMED  Analiyah Lechuga Lorie Phenix 10/18/2019, 10:09 AM

## 2019-10-19 ENCOUNTER — Inpatient Hospital Stay (HOSPITAL_COMMUNITY): Payer: Medicare Other

## 2019-10-19 DIAGNOSIS — R252 Cramp and spasm: Secondary | ICD-10-CM

## 2019-10-19 DIAGNOSIS — M62838 Other muscle spasm: Secondary | ICD-10-CM

## 2019-10-19 DIAGNOSIS — R0989 Other specified symptoms and signs involving the circulatory and respiratory systems: Secondary | ICD-10-CM

## 2019-10-19 MED ORDER — GERHARDT'S BUTT CREAM
TOPICAL_CREAM | Freq: Three times a day (TID) | CUTANEOUS | Status: DC
  Administered 2019-10-25: 1 via TOPICAL
  Filled 2019-10-19: qty 1

## 2019-10-19 NOTE — Progress Notes (Addendum)
Patientc/o of unable to breath even with 02 on ; refused  to let head up claims it will compress his diaphragm  02 at 3 L vital signs WNL; coughing noted; lungs clear diminished on bases; c/o leg  spasms frequently requesting for albuterol; patient had resp meds this morning. RT notified if pt can have albuterol claims it is ok ; Med given.

## 2019-10-19 NOTE — Plan of Care (Signed)
  Problem: SCI BLADDER ELIMINATION Goal: RH STG MANAGE BLADDER WITH ASSISTANCE Description: STG Manage Bladder With min/mod Assistance Outcome: Not Progressing; foley cath Problem: RH SKIN INTEGRITY Goal: RH STG SKIN FREE OF INFECTION/BREAKDOWN Description: Patients skin will remain free from further infection or breakdown with mod assist. Outcome: Not Progressing;MASD bottom ; MD aware; new order for gerhart; dressing changed

## 2019-10-19 NOTE — Progress Notes (Addendum)
[facility] PHYSICAL MEDICINE & REHABILITATION PROGRESS NOTE    Subjective: Patient seen laying in bed this morning.  He states he slept fairly well overnight.  Discussed with nursing who notes significant spasticity with range of motion, however spasms appear to have improved at rest.  Nursing also notes MAST to bottom, evaluated with nursing.  He was seen by neurosurgery again yesterday, notes reviewed-outpatient follow-up.  Patient unable to tolerate PRAFOs overnight due to positioning.  Discussed with nursing.  ROS: Denies CP, SOB, N/V/D  Objective:   No results found. No results for input(s): WBC, HGB, HCT, PLT in the last 72 hours. No results for input(s): NA, K, CL, CO2, GLUCOSE, BUN, CREATININE, CALCIUM in the last 72 hours.  Intake/Output Summary (Last 24 hours) at 10/19/2019 0753 Last data filed at 10/19/2019 0544 Gross per 24 hour  Intake 780 ml  Output 2450 ml  Net -1670 ml     Physical Exam: Vital Signs Blood pressure (!) 151/77, pulse 66, temperature 97.9 F (36.6 C), resp. rate 16, height 5\' 7"  (1.702 m), weight 95.8 kg, SpO2 94 %. Constitutional: No distress . Vital signs reviewed. HENT: Normocephalic.  Atraumatic. Eyes: EOMI. No discharge. Cardiovascular: No JVD. Respiratory: Normal effort.  No stridor.  + CPAP. GI: Non-distended. Skin: MASD with sanguinous drainage Psych: Normal mood.  Normal behavior. Musc: No edema in extremities.  No tenderness in extremities. Neurologic: Alert Motor:  Left lower extremity: 2/5 proximal distal, unchanged Right lower extremity: 1/5 proximal distal  Assessment/Plan: 1. Functional deficits secondary to lumbar spinal stenosis with neurogenic claudication which require 3+ hours per day of interdisciplinary therapy in a comprehensive inpatient rehab setting.  Physiatrist is providing close team supervision and 24 hour management of active medical problems listed below.  Physiatrist and rehab team continue to assess barriers to  discharge/monitor patient progress toward functional and medical goals  Care Tool:  Bathing    Body parts bathed by patient: Right arm, Left arm, Chest, Abdomen, Front perineal area, Right upper leg, Left upper leg, Right lower leg, Left lower leg, Face   Body parts bathed by helper: Buttocks     Bathing assist Assist Level: Minimal Assistance - Patient > 75%     Upper Body Dressing/Undressing Upper body dressing   What is the patient wearing?: Pull over shirt    Upper body assist Assist Level: Maximal Assistance - Patient 25 - 49%    Lower Body Dressing/Undressing Lower body dressing      What is the patient wearing?: Pants, Underwear/pull up     Lower body assist Assist for lower body dressing: Total Assistance - Patient < 25%     Toileting Toileting    Toileting assist Assist for toileting: Dependent - Patient 0%     Transfers Chair/bed transfer  Transfers assist     Chair/bed transfer assist level: 2 Helpers(slide board)     Locomotion Ambulation   Ambulation assist   Ambulation activity did not occur: Safety/medical concerns  Assist level: 2 helpers Assistive device: Parallel bars Max distance: 5'   Walk 10 feet activity   Assist  Walk 10 feet activity did not occur: Safety/medical concerns        Walk 50 feet activity   Assist Walk 50 feet with 2 turns activity did not occur: Safety/medical concerns         Walk 150 feet activity   Assist Walk 150 feet activity did not occur: Safety/medical concerns         Walk  10 feet on uneven surface  activity   Assist Walk 10 feet on uneven surfaces activity did not occur: Safety/medical concerns         Wheelchair     Assist Will patient use wheelchair at discharge?: Yes Type of Wheelchair: Manual    Wheelchair assist level: Supervision/Verbal cueing Max wheelchair distance: 37'    Wheelchair 50 feet with 2 turns activity    Assist        Assist Level:  Supervision/Verbal cueing   Wheelchair 150 feet activity     Assist      Assist Level: Supervision/Verbal cueing   Blood pressure (!) 151/77, pulse 66, temperature 97.9 F (36.6 C), resp. rate 16, height 5\' 7"  (1.702 m), weight 95.8 kg, SpO2 94 %.    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  Per neurosurgery, follow-up with outpatient after NCS/EMG.  Neurology consulted recommended management of spasticity and spasms with outpatient NCS/EMG  Continue CIR 2. Antithrombotics: -DVT/anticoagulation:Lovenox.   4/12- Dopplers (-)- con't lovenox -antiplatelet therapy: Aspirin 81 mg daily 3. Pain Management:Neurontin 100 mg nightly, OxyContin 10 mg every 12 hours, hydrocodone as needed as well as Imitrex for headaches  baclofen to 15 mg TID- with meals and 20 mg QHS, increase nighttime dose to 30mg  on 5/1 with improvement in resting spasms  Tizanidine 2 mg 3 times daily 4. Mood:Provide emotional support -antipsychotic agents: N/A 5. Neuropsych: This patientiscapable of making decisions on hisown behalf. 6. Skin/Wound Care:place steristrips over right index finger lac.                          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  Foam dressing left knee  Barrier cream MASD on sacrum 7. Fluids/Electrolytes/Nutrition:Routine in and outs  Calcium 8.5 on 4/26-started calcium supplement, labs ordered for tomorrow 8. Chronic restless leg syndrome. Sinemet 10-100 mg twice daily.  See #3 9. 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 OSA with CPAP/2 L chronic oxygen at home. Continue inhalers 11. 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 Follow-up outpatient 12. Diabetes mellitus. Hemoglobin A1c 5.8. SSI. Changed to [facility] HS for CBG and SSI  HbA1c 5.8- 13. Neurogenic bowel and bladder. Check PVR  Incontinent BMs 14. Hypothyroidism.  Continue Synthroid 15. Chronic diastolic congestive heart failure. Monitor for any signs of fluid overload. Check regular weights   Filed Weights   10/17/19 [id number] 10/18/19 0507 10/19/19 [id number]  Weight: 95.7 kg 95.5 kg 95.8 kg   Stable on 5/2 16. Hyperlipidemia. Zocor 17. Code status: Changed to DNR as per patient's wishes. 18. Leukocytosis - pneumonia- Resolved 19. Wheezing  Resolved on abx, scheduled umeclidinium  4/28- pulmonary saw pt- maximized meds 20 Spasticity vs tone in hamstrings, chronically WC bound may be contracted  See #3 21. Neurogenic bladder  4/28-started Flomax 0.4 mg daily and place foley per wishes of pt.  22.  Labile blood pressure  Monitor for trend  LOS: 23 days A FACE TO FACE EVALUATION WAS PERFORMED  Raveen Wieseler Lorie Phenix 10/19/2019, 7:53 AM

## 2019-10-19 NOTE — Progress Notes (Signed)
Occupational Therapy Session Note  Patient Details  Name: GARRELL FLAGG MRN: [id number] Date of Birth: 06-13-1941  Today's Date: 10/19/2019 OT Individual Time: 0904-1000 OT Individual Time Calculation (min): 56 min    Short Term Goals: Week 4:  OT Short Term Goal 1 (Week 4): STG=LTG secondary to ELOS  Skilled Therapeutic Interventions/Progress Updates:  Patient met lying supine on 2L via [place] in agreement with OT treatment session with focus on bed mobility and self-care re-education as detailed below. Patient able to wash face in supine with set-up assist. Patient rolled to R in supine with OT noting episode of fecal incontinence. Patient required +2 helpers for hygiene in supine. RN notified of need to change mepilex boarder on buttock and LLE. RN present for 10 minutes of treatment session to change dressings on both areas. LB dressing in supine with +2 helpers for rolling R<>L to hike pants over hips. Patient declined use of Stedy for transfers this date noting difficulty secondary to LE muscle spasms yesterday. Use of mechanical lift for supine to wc transfer. Patient set-up for breakfast meal. Session concluded with patient seated in wc with call bell within reach, belt alarm activated, and all needs met.   Therapy Documentation Precautions:  Precautions Precautions: Fall Restrictions Weight Bearing Restrictions: No  Therapy/Group: Individual Therapy  Zora Glendenning R How[place]-Davis 10/19/2019, 7:09 AM

## 2019-10-20 ENCOUNTER — Inpatient Hospital Stay (HOSPITAL_COMMUNITY): Payer: Medicare Other

## 2019-10-20 ENCOUNTER — Inpatient Hospital Stay (HOSPITAL_COMMUNITY): Payer: Medicare Other | Admitting: Occupational Therapy

## 2019-10-20 ENCOUNTER — Inpatient Hospital Stay (HOSPITAL_COMMUNITY): Payer: Medicare Other | Admitting: Physical Therapy

## 2019-10-20 LAB — BASIC METABOLIC PANEL
Anion gap: 7 (ref 5–15)
BUN: 11 mg/dL (ref 8–23)
CO2: 35 mmol/L — ABNORMAL HIGH (ref 22–32)
Calcium: 8.8 mg/dL — ABNORMAL LOW (ref 8.9–10.3)
Chloride: 101 mmol/L (ref 98–111)
Creatinine, Ser: 0.69 mg/dL (ref 0.61–1.24)
GFR calc Af Amer: >60 mL/min (ref >60)
GFR calc non Af Amer: >60 mL/min (ref >60)
Glucose, Bld: 98 mg/dL (ref 70–99)
Potassium: 3.8 mmol/L (ref 3.5–5.1)
Sodium: 143 mmol/L (ref 135–145)

## 2019-10-20 LAB — CBC WITH DIFFERENTIAL/PLATELET
Abs Immature Granulocytes: 0.13 10*3/uL — ABNORMAL HIGH (ref 0.00–0.07)
Basophils Absolute: 0.1 10*3/uL (ref 0.0–0.1)
Basophils Relative: 1 %
Eosinophils Absolute: 0.4 10*3/uL (ref 0.0–0.5)
Eosinophils Relative: 5 %
HCT: 36.9 % — ABNORMAL LOW (ref 39.0–52.0)
Hemoglobin: 11.8 g/dL — ABNORMAL LOW (ref 13.0–17.0)
Immature Granulocytes: 2 %
Lymphocytes Relative: 18 %
Lymphs Abs: 1.5 10*3/uL (ref 0.7–4.0)
MCH: 28.7 pg (ref 26.0–34.0)
MCHC: 32 g/dL (ref 30.0–36.0)
MCV: 89.8 fL (ref 80.0–100.0)
Monocytes Absolute: 0.7 10*3/uL (ref 0.1–1.0)
Monocytes Relative: 9 %
Neutro Abs: 5.5 10*3/uL (ref 1.7–7.7)
Neutrophils Relative %: 65 %
Platelets: 457 10*3/uL — ABNORMAL HIGH (ref 150–400)
RBC: 4.11 MIL/uL — ABNORMAL LOW (ref 4.22–5.81)
RDW: 14.4 % (ref 11.5–15.5)
WBC: 8.4 10*3/uL (ref 4.0–10.5)
nRBC: 0 % (ref 0.0–0.2)

## 2019-10-20 MED ORDER — TAMSULOSIN HCL 0.4 MG PO CAPS
0.8000 mg | ORAL_CAPSULE | Freq: Every day | ORAL | Status: DC
  Administered 2019-10-21 – 2019-10-27 (×7): 0.8 mg via ORAL
  Filled 2019-10-20 (×8): qty 2

## 2019-10-20 MED ORDER — BACLOFEN 5 MG HALF TABLET
15.0000 mg | ORAL_TABLET | Freq: Three times a day (TID) | ORAL | Status: DC
  Administered 2019-10-20 – 2019-10-21 (×3): 15 mg via ORAL
  Filled 2019-10-20 (×5): qty 1

## 2019-10-20 MED FILL — Rocuronium Bromide IV Soln 50 MG/5ML (10 MG/ML): INTRAVENOUS | Qty: 5 | Status: AC

## 2019-10-20 MED FILL — Ondansetron HCl Inj 4 MG/2ML (2 MG/ML): INTRAMUSCULAR | Qty: 2 | Status: AC

## 2019-10-20 MED FILL — Dexamethasone Sod Phosphate Preservative Free Inj 10 MG/ML: INTRAMUSCULAR | Qty: 1 | Status: AC

## 2019-10-20 MED FILL — Phenylephrine HCl IV Soln 10 MG/ML: INTRAVENOUS | Qty: 1 | Status: AC

## 2019-10-20 MED FILL — Ephedrine Sulfate Inj 50 MG/ML: INTRAMUSCULAR | Qty: 1 | Status: AC

## 2019-10-20 MED FILL — Midazolam HCl Inj 2 MG/2ML (Base Equivalent): INTRAMUSCULAR | Qty: 2 | Status: AC

## 2019-10-20 MED FILL — Sugammadex Sodium IV 200 MG/2ML (Base Equivalent): INTRAVENOUS | Qty: 2 | Status: AC

## 2019-10-20 MED FILL — Propofol IV Emul 200 MG/20ML (10 MG/ML): INTRAVENOUS | Qty: 20 | Status: AC

## 2019-10-20 MED FILL — Fentanyl Citrate Preservative Free (PF) Inj 100 MCG/2ML: INTRAMUSCULAR | Qty: 2 | Status: AC

## 2019-10-20 MED FILL — Lidocaine HCl Local Soln Prefilled Syringe 100 MG/5ML (2%): INTRAMUSCULAR | Qty: 5 | Status: AC

## 2019-10-20 NOTE — Progress Notes (Signed)
Physical Therapy Session Note  Patient Details  Name: [name] MRN: [id number] Date of Birth: 1940-08-19  Today's Date: 10/20/2019 PT Individual Time: 1310-1415 PT Individual Time Calculation (min): 65 min  PT Missed Time: 10 min Missed Time Reason: lunch  Short Term Goals: Week 4:  PT Short Term Goal 1 (Week 4): Pt will complete bed mobility with assist x 1 PT Short Term Goal 2 (Week 4): Pt will complete least restrictive transfer with assist x 1 PT Short Term Goal 3 (Week 4): Pt will perform w/c mobility x 100 ft at Supervision level  Skilled Therapeutic Interventions/Progress Updates:    Pt received seated in w/c in room, still eating lunch and requesting time to finish. This therapist returns after 10 min and pt done with lunch and agreeable to PT session. Pt on 3L O2 at rest, SpO2 96% at rest. Lateral and anterior leans in w/c with min A for placement of maxi move sling. Maxi move transfer w/c to bed. Seated balance EOB with close SBA to min A. Seated R/L lateral leans x 5 reps each with min to mod A to return to midline. Posterior leans 2 x 5 reps with min A to return to full upright posture. Lateral leans in order to don maxi move sling while seated EOB with min A. Maxi move transfer back to w/c. Manual w/c propulsion x 50 ft with use of BUE at Supervision level for global endurance training. Seated ball toss 3 x 15 reps to fatigue with focus on use of RUE>LUE. Seated B shoulder flexion AAROM x 5 reps each with assist to reach full ROM due to weakness. SpO2 drops to 90% with activity while on 3L, returns to 93% (+) with seated rest break and pursed lip breathing techniques. Pt left seated in w/c in room with needs in reach, quick release belt and chair alarm in place at end of session.  Therapy Documentation Precautions:  Precautions Precautions: Fall Restrictions Weight Bearing Restrictions: No    Therapy/Group: Individual Therapy   Excell Seltzer, PT, DPT  10/20/2019, 3:41  PM

## 2019-10-20 NOTE — Progress Notes (Signed)
RT NOTES: Patient found on room air, sats 86%. Placed on 3lpm nasal cannula, sats improving. Will continue to monitor.

## 2019-10-20 NOTE — Plan of Care (Signed)
  Problem: SCI BLADDER ELIMINATION Goal: RH STG MANAGE BLADDER WITH ASSISTANCE Description: STG Manage Bladder With min/mod Assistance Outcome: Progressing   Problem: RH SKIN INTEGRITY Goal: RH STG SKIN FREE OF INFECTION/BREAKDOWN Description: Patients skin will remain free from further infection or breakdown with mod assist. Outcome: Progressing Goal: RH STG MAINTAIN SKIN INTEGRITY WITH ASSISTANCE Description: STG Maintain Skin Integrity With mod Assistance. Outcome: Progressing Goal: RH STG ABLE TO PERFORM INCISION/WOUND CARE W/ASSISTANCE Description: STG Able To Perform Incision/Wound Care With total Assistance from caregiver. Outcome: Progressing

## 2019-10-20 NOTE — Progress Notes (Signed)
Placed patient on CPAP previous settings, FFM (home mask) with 3lpm O2 bleed in.

## 2019-10-20 NOTE — Consult Note (Addendum)
Neurology Consultation  Reason for Consult: Reconsult for possible help with patient's spasticity Referring Physician: Courtney Heys, MD  CC: Specificity  History is obtained from: Chart  HPI: [name] is a 79 y.o. male with chronic restless leg syndrome maintained on Sinemet, myoclonic jerking maintained on Keppra, thoracic ascending aortic aneurysm measured at 4.2 cm on 08/18/2017, myelopathy of cervical cord, cervical/lumbar radiculopathy, left hemiparesis/spasticity and bowel/bladder incontinence, status post ACDF 09/03/2017.  Patient has had motor symptoms for multiple years and is followed by Dr. Marinus Maw of [facility] as well as by Neurosurgery.  Patient has had improvement in function postoperatively and is able to stand/pivot and walk with some assistance with a walker.  He has recently had worsening restless legs and weakness.  He was admitted to rehab on 3/31 where he was improving until he contracted a pneumonia.  Neurology has been called back to consult on patient due to continued and increasing spasticity.  Currently he is on:  Baclofen 15 mg 3 times daily  Baclofen 30 mg nightly  Neurontin 100 mg nightly  Keppra 250 mg twice daily  Robaxin 500 mg daily  OxyContin 10 mg twice daily  Zanaflex 2 mg 3 times daily  Patient states that when he is taking his baclofen, soon after the spasms will stop.  He also admits that he feels as though he is getting weaker.  He notes that when he takes a shower his left quadricep feels as though it is burning when the water hits his leg.  Past Medical History:  Diagnosis Date  . Arthritis   . Barrett esophagus   . Bronchitis   . Carotid artery occlusion   . COPD (chronic obstructive pulmonary disease) ([place])   . Diabetes mellitus without complication ([place])   . GERD (gastroesophageal reflux disease)   . Headache    migraines  . History of thyroid cancer 2008  . Hypertension   . Restless leg   . Sleep apnea with use  of continuous positive airway pressure (CPAP)    2011 [facility] , [facility]  77cn central and obstrcutive. 16 cm water , 3 cm EPR.   . Thoracic ascending aortic aneurysm ([facility])    4.2 cm ascending TAA 08/18/17 CTA. Annual imaging recommended.  . thyroid ca dx'd 2009 or 2010   surg and radioactive isoptope  . Ulcer    Peptic ulcer disease    Family History  Problem Relation Age of Onset  . COPD Mother   . Hyperlipidemia Mother   . Hypertension Mother   . Diabetes Father   . Kidney disease Father        ESRD  . Hypertension Father   . Cancer Father   . Hyperlipidemia Father     Social History:   reports that he quit smoking about 14 years ago. His smoking use included cigars and cigarettes. He quit after 40.00 years of use. He has never used smokeless tobacco. He reports that he does not drink alcohol or use drugs.  Medications  Current Facility-Administered Medications:  .  acetaminophen (TYLENOL) tablet 650 mg, 650 mg, Oral, Q6H PRN, Lovorn, Megan, MD, 650 mg at 10/08/19 1030 .  albuterol (PROVENTIL) (2.5 MG/3ML) 0.083% nebulizer solution 2.5 mg, 2.5 mg, Inhalation, Q4H PRN, Angiulli, Lavon Paganini, PA-C, 2.5 mg at 10/19/19 0810 .  allopurinol (ZYLOPRIM) tablet 300 mg, 300 mg, Oral, Daily, Angiulli, Lavon Paganini, PA-C, 300 mg at 10/20/19 0753 .  aspirin EC tablet 81 mg, 81 mg, Oral, Daily,  Cathlyn Parsons, PA-C, 81 mg at 10/20/19 [id number] .  baclofen (LIORESAL) tablet 15 mg, 15 mg, Oral, TID with meals, Lovorn, Megan, MD .  baclofen (LIORESAL) tablet 30 mg, 30 mg, Oral, QHS, Patel, Domenick Bookbinder, MD, 30 mg at 10/19/19 2112 .  bisacodyl (DULCOLAX) suppository 10 mg, 10 mg, Rectal, Once, Kirsteins, Luanna Salk, MD .  calcium citrate (CALCITRATE - dosed in mg elemental calcium) tablet 200 mg of elemental calcium, 200 mg of elemental calcium, Oral, Daily, Raulkar, Clide Deutscher, MD, 200 mg of elemental calcium at 10/20/19 0752 .  carbidopa-levodopa (SINEMET IR) 10-100 MG per tablet immediate release 1  tablet, 1 tablet, Oral, BID, Angiulli, Lavon Paganini, PA-C, 1 tablet at 10/20/19 0753 .  Chlorhexidine Gluconate Cloth 2 % PADS 6 each, 6 each, Topical, Q0600, Lovorn, Jinny Blossom, MD, 6 each at 10/20/19 0517 .  colchicine tablet 0.6 mg, 0.6 mg, Oral, Daily, Angiulli, Lavon Paganini, PA-C, 0.6 mg at 10/20/19 [id number] .  enoxaparin (LOVENOX) injection 40 mg, 40 mg, Subcutaneous, Daily, Angiulli, Lavon Paganini, PA-C, 40 mg at 10/20/19 [id number] .  fluticasone furoate-vilanterol (BREO ELLIPTA) 200-25 MCG/INH 1 puff, 1 puff, Inhalation, Daily, Angiulli, Lavon Paganini, PA-C, 1 puff at 10/19/19 0723 .  gabapentin (NEURONTIN) capsule 100 mg, 100 mg, Oral, QHS, Angiulli, Lavon Paganini, PA-C, 100 mg at 10/19/19 2112 .  Gerhardt's butt cream, , Topical, TID, Jamse Arn, MD, Given at 10/20/19 [phone] .  guaiFENesin (MUCINEX) 12 hr tablet 1,200 mg, 1,200 mg, Oral, BID, Angiulli, Lavon Paganini, PA-C, 1,200 mg at 10/20/19 0753 .  HYDROcodone-acetaminophen (NORCO/VICODIN) 5-325 MG per tablet 1 tablet, 1 tablet, Oral, TID PRN, Angiulli, Lavon Paganini, PA-C, 1 tablet at 10/20/19 0511 .  ipratropium-albuterol (DUONEB) 0.5-2.5 (3) MG/3ML nebulizer solution 3 mL, 3 mL, Nebulization, Q6H PRN, Angiulli, Daniel J, PA-C, 3 mL at 10/06/19 1019 .  latanoprost (XALATAN) 0.005 % ophthalmic solution 1 drop, 1 drop, Both Eyes, QHS, Angiulli, Lavon Paganini, PA-C, 1 drop at 10/19/19 2124 .  levETIRAcetam (KEPPRA) tablet 250 mg, 250 mg, Oral, BID, Angiulli, Lavon Paganini, PA-C, 250 mg at 10/20/19 0753 .  levothyroxine (SYNTHROID) tablet 125 mcg, 125 mcg, Oral, QAC breakfast, Cathlyn Parsons, PA-C, 125 mcg at 10/20/19 0510 .  lidocaine (XYLOCAINE) 2 % jelly, , Topical, PRN, Love, Pamela S, PA-C .  methocarbamol (ROBAXIN) tablet 500 mg, 500 mg, Oral, Once, Angiulli, Lavon Paganini, PA-C .  multivitamin with minerals tablet 1 tablet, 1 tablet, Oral, Daily, Angiulli, Lavon Paganini, PA-C, 1 tablet at 10/20/19 [phone] .  nitroGLYCERIN (NITROSTAT) SL tablet 0.4 mg, 0.4 mg, Sublingual, Q5 min PRN, Angiulli,  Lavon Paganini, PA-C .  oxyCODONE (OXYCONTIN) 12 hr tablet 10 mg, 10 mg, Oral, Q12H, Angiulli, Lavon Paganini, PA-C, 10 mg at 10/20/19 0753 .  oxymetazoline (AFRIN) 0.05 % nasal spray 1 spray, 1 spray, Each Nare, BID PRN, Lovorn, Megan, MD, 1 spray at 10/19/19 2114 .  pantoprazole (PROTONIX) EC tablet 40 mg, 40 mg, Oral, QPM, Angiulli, Lavon Paganini, PA-C, 40 mg at 10/19/19 1739 .  polyethylene glycol (MIRALAX / GLYCOLAX) packet 17 g, 17 g, Oral, BID, Angiulli, Lavon Paganini, PA-C, 17 g at 10/20/19 [id number] .  simvastatin (ZOCOR) tablet 40 mg, 40 mg, Oral, q1800, Angiulli, Lavon Paganini, PA-C, 40 mg at 10/19/19 1740 .  sodium chloride (OCEAN) 0.65 % nasal spray 1 spray, 1 spray, Each Nare, PRN, Angiulli, Lavon Paganini, PA-C, 1 spray at 10/06/19 0916 .  sodium chloride flush (NS) 0.9 % injection 10-40 mL, 10-40 mL, Intracatheter, Q12H, Lovorn, Megan,  MD, 10 mL at 10/20/19 0754 .  sodium chloride flush (NS) 0.9 % injection 10-40 mL, 10-40 mL, Intracatheter, PRN, Lovorn, Megan, MD .  sorbitol 70 % solution 30 mL, 30 mL, Oral, Daily PRN, Cathlyn Parsons, PA-C, 30 mL at 10/11/19 2024 .  SUMAtriptan (IMITREX) tablet 50 mg, 50 mg, Oral, Q2H PRN, Cathlyn Parsons, PA-C, 50 mg at 10/07/19 2312 .  tamsulosin (FLOMAX) capsule 0.4 mg, 0.4 mg, Oral, QPC supper, Lovorn, Megan, MD, 0.4 mg at 10/19/19 1740 .  tiZANidine (ZANAFLEX) tablet 2 mg, 2 mg, Oral, TID, Lovorn, Megan, MD, 2 mg at 10/20/19 0752 .  traZODone (DESYREL) tablet 50 mg, 50 mg, Oral, QHS PRN, Angiulli, Lavon Paganini, PA-C .  umeclidinium bromide (INCRUSE ELLIPTA) 62.5 MCG/INH 1 puff, 1 puff, Inhalation, Daily, Angiulli, Lavon Paganini, PA-C, 1 puff at 10/20/19 0829  ROS:   General ROS: negative for - chills, fatigue, fever, night sweats, weight gain or weight loss Psychological ROS: negative for - behavioral disorder, hallucinations, memory difficulties, mood swings or suicidal ideation Ophthalmic ROS: negative for - blurry vision, double vision, eye pain or loss of vision ENT ROS:  negative for - epistaxis, nasal discharge, oral lesions, sore throat, tinnitus or vertigo Allergy and Immunology ROS: negative for - hives or itchy/watery eyes Hematological and Lymphatic ROS: negative for - bleeding problems, bruising or swollen lymph nodes Endocrine ROS: negative for - galactorrhea, hair pattern changes, polydipsia/polyuria or temperature intolerance Respiratory ROS: negative for - cough, hemoptysis, shortness of breath or wheezing Cardiovascular ROS: negative for - chest pain, dyspnea on exertion, edema or irregular heartbeat Gastrointestinal ROS: negative for - abdominal pain, diarrhea, hematemesis, nausea/vomiting or stool incontinence Genito-Urinary ROS: negative for - dysuria, hematuria, incontinence or urinary frequency/urgency Musculoskeletal ROS: negative for -muscle weakness and spasms Neurological ROS: as noted in HPI Dermatological ROS: negative for rash and skin lesion changes  Exam: Current vital signs: BP (!) 144/75 (BP Location: Right Arm)   Pulse 82   Temp 98.6 F (37 C) (Oral)   Resp 20   Ht 5\' 7"  (1.702 m)   Wt 95.8 kg   SpO2 90%   BMI 33.08 kg/m  Vital signs in last 24 hours: Temp:  [98.1 F (36.7 C)-98.6 F (37 C)] 98.6 F (37 C) (05/03 0509) Pulse Rate:  [81-82] 82 (05/03 0509) Resp:  [16-20] 20 (05/03 0509) BP: (135-188)/(68-88) 144/75 (05/03 0509) SpO2:  [90 %-95 %] 90 % (05/03 0831)   Constitutional: Appears well-developed and well-nourished.  Psych: Affect appropriate to situation Eyes: No scleral injection HENT: No OP obstrucion Head: Normocephalic.  Cardiovascular: Normal rate and regular rhythm.  Respiratory: Effort normal, non-labored breathing GI: Soft.  No distension. There is no tenderness.  Skin: WDI  Neuro: Mental Status: Patient is awake, alert, oriented to person, place, month, year, and situation. Speech-no dysarthria, no aphasia, naming-repeating-comprehension intact. Cranial Nerves: II: Visual Fields are full.   III,IV, VI: EOMI without ptosis or diploplia. Pupils equal, round and reactive to light V: Facial sensation is symmetric to temperature VII: Facial movement is symmetric.  VIII: hearing is intact to voice X: Palate elevates symmetrically XI: Shoulder shrug is symmetric. XII: tongue is midline without atrophy or fasciculations.  Motor: Motor: Right : Upper extremity    Left:     Upper extremity 5/5 deltoid       5/5 deltoid 5/5 tricep      5/5 tricep 5/5 biceps      5/5 biceps  5/5wrist flexion  5/5 wrist flexion 5/5 wrist extension     5/5 wrist extension 5/5 hand grip      5/5 hand grip  Lower extremity     Lower extremity 2/5 hip flexor      2/5 hip flexor 1/5 quadricep      1/5 quadriceps  5/5 hamstrings     5/5 hamstrings 5/5 plantar flexion       4/5 plantar flexion 1/5 plantar extension     2/5 plantar extension Sensory: Reduced sensation to temperature, light touch in the right leg compared to the left leg. Deep Tendon Reflexes: 2+ and symmetric in the biceps Positive Hoffmann's on the right Plantars: Toes are downgoing bilaterally.  Cerebellar: FNF and HKS are intact bilaterally  Labs I have reviewed labs in epic and the results pertinent to this consultation are:   CBC    Component Value Date/Time   WBC 8.4 10/20/2019 0531   RBC 4.11 (L) 10/20/2019 0531   HGB 11.8 (L) 10/20/2019 0531   HGB 12.5 (L) 02/14/2019 1001   HCT 36.9 (L) 10/20/2019 0531   HCT 37.7 02/14/2019 1001   PLT 457 (H) 10/20/2019 0531   PLT 190 02/14/2019 1001   MCV 89.8 10/20/2019 0531   MCV 87 02/14/2019 1001   MCH 28.7 10/20/2019 0531   MCHC 32.0 10/20/2019 0531   RDW 14.4 10/20/2019 0531   RDW 14.5 02/14/2019 1001   LYMPHSABS 1.5 10/20/2019 0531   LYMPHSABS 1.1 02/14/2019 1001   MONOABS 0.7 10/20/2019 0531   EOSABS 0.4 10/20/2019 0531   EOSABS 0.4 02/14/2019 1001   BASOSABS 0.1 10/20/2019 0531   BASOSABS 0.0 02/14/2019 1001    CMP     Component Value Date/Time   NA 143  10/20/2019 0531   NA 146 (H) 04/07/2019 1426   K 3.8 10/20/2019 0531   CL 101 10/20/2019 0531   CO2 35 (H) 10/20/2019 0531   GLUCOSE 98 10/20/2019 0531   BUN 11 10/20/2019 0531   BUN 22 04/07/2019 1426   CREATININE 0.69 10/20/2019 0531   CALCIUM 8.8 (L) 10/20/2019 0531   PROT 5.8 (L) 10/08/2019 1455   ALBUMIN 2.7 (L) 10/08/2019 1455   AST 14 (L) 10/08/2019 1455   ALT 7 10/08/2019 1455   ALKPHOS 57 10/08/2019 1455   BILITOT 0.8 10/08/2019 1455   GFRNONAA >60 10/20/2019 0531   GFRAA >60 10/20/2019 0531    Imaging I have reviewed the images obtained:  CT-scan of the brain-no acute intracranial findings  MRI cervical spine-postoperative changes from prior ACDF at C3-C6.  Residual endplate osseous ridging, with persistent mild to moderate diffuse spinal stenosis.  Patchy cord signal abnormality at the level of C3-4, consistent with chronic myelomalacia.  MRI thoracic spine-there is an area of hyperintense T2 weighted signal within the lower thoracic spinal cord.  This is incompletely evaluated due to motion artifact.  At this time would need repeat exam under sedation.  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  Moderate impingement at the L1-2 and 4-5.  Mild impingement at T12-L1 and L5-S1.  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 Neurology was re-consulted due to patient continuing to have  severe muscle spasticity with intermittent painful muscle spasms.  The patient is currently on high-dose oral baclofen along  with Zanaflex.  Recommendations: -Patient likely would benefit from a baclofen pump.  Would consult Neurosurgery for evaluate risks/benefits of baclofen pump placement. -Outpatient EMG and nerve conduction velocity study -Continue PT/OT  Electronically signed: Dr. Kerney Elbe

## 2019-10-20 NOTE — Progress Notes (Signed)
Occupational Therapy Session Note  Patient Details  Name: JAISHAWN BUFFONE MRN: [id number] Date of Birth: [birth year]  Today's Date: 10/20/2019 OT Individual Time: 1103-1202 OT Individual Time Calculation (min): 59 min  Short Term Goals: Week 3:  OT Short Term Goal 1 (Week 3): STG=LTG secondary to ELOS  Skilled Therapeutic Interventions/Progress Updates:    Pt greeted in the w/c with no c/o pain, satting at 90-91% on 3L at rest. He wanted to shave today. Pt self propelled the w/c to the sink with increased time and assistance for locking brakes on the Lt side once he reached the sink. Pt used his trimmers with the Rt hand and needed 1 rest break due to arm fatigue. Min A for thoroughness on his neck. Pt doffed his shirt with Mod A and vcs for managing his nasal cannula. After OT fluffed out shirt to remove the hair, he donned shirt once again with vcs, increased time, and Mod A. Active assist to reach soap dispenser with the Rt hand to complete hand washing. He then applied lotion to face with setup assist. Pt throughout session required rest breaks due to fatigue, satting at 90-93%. Educated pt on diaphragmatic breathing techniques to implement during activity and also during breaks, emphasis placed on hands on stomach and breathing out with exertion. At end of session pt remained in the w/c, left him with all needs within reach and safety belt fastened. Also provided him with lavender scented cotton balls for the room to encourage diaphragmatic breathing at rest.   Therapy Documentation Precautions:  Precautions Precautions: Fall Restrictions Weight Bearing Restrictions: No ADL: ADL Eating: Set up Where Assessed-Eating: Chair Grooming: Setup Where Assessed-Grooming: Sitting at sink Upper Body Bathing: Minimal assistance Where Assessed-Upper Body Bathing: Sitting at sink Lower Body Bathing: Maximal assistance Where Assessed-Lower Body Bathing: Bed level Upper Body Dressing: Minimal  assistance Where Assessed-Upper Body Dressing: Sitting at sink Lower Body Dressing: Maximal assistance Where Assessed-Lower Body Dressing: Bed level Toileting: Maximal assistance Where Assessed-Toileting: Bed level ADL Comments: no urge to void during OT sessions, condom cath in am, brief wet and changed bed level during pm session      Therapy/Group: Individual Therapy  Jenavee Laguardia A Mehtaab Mayeda 10/20/2019, 12:37 PM

## 2019-10-20 NOTE — Progress Notes (Signed)
Occupational Therapy Session Note  Patient Details  Name: [name] MRN: [id number] Date of Birth: August 23, 1940  Today's Date: 10/20/2019 OT Individual Time: 0930-1030 OT Individual Time Calculation (min): 60 min    Short Term Goals: Week 4:  OT Short Term Goal 1 (Week 4): STG=LTG secondary to ELOS  Skilled Therapeutic Interventions/Progress Updates:    Pt resting in bed upon arrival and eager to get OOB. Pt's LLE externally rotated with approx 90 degree knee flexion.  Pt with increased pain when therapist attempted to perform passive stretching.  PROM also appeared to trigger increased spasms. LB dressing at bed level, rolling R<>L to facilitate pulling pants over hips. Transfer to w/c with MaxiLift. Pt engaged in UB bathing/dressing at w/c level. B shoulder AROM approx 80 degrees with limited horizontal adduction limiting pt's ability to bathe UB. Pt unable to initiate trunk flexion to facilitate his back being bathed. Pt requires mod A/max A for UB dressing tasks. Pt notable weaker with increased BLE spasms inhibiting pt's ability to independently perform bathing/dressing tasks.  Pt remained seated in w/c with all needs within reach and belt alarm activated.   Therapy Documentation Precautions:  Precautions Precautions: Fall Restrictions Weight Bearing Restrictions: No Pain: Pt c/o 8/10 pain in BLE with spasms; L ankle tender to touch; repositioned and RN aware   Therapy/Group: Individual Therapy  [name] 10/20/2019, 10:35 AM

## 2019-10-20 NOTE — Progress Notes (Signed)
[place] PHYSICAL MEDICINE & REHABILITATION PROGRESS NOTE    Subjective:  Pt upset- didn't think saw Neurology over weekend (there's a note) and their exam is not consistent with my exam or NSU- Called Neuro back- discussed putting in ITB pump, but didn't discuss doing any work up for this patient except an outpatient EMG/NCS- however he's getting worse, and wondering if there's anything else to be done for progressive weakness.    Pt said had BM yesterday and "there will be one today".   Having a little trouble breathing this AM.  Little SOB.   ROS: Pt denies abd pain, CP, N/V/C/D, and vision changes   Objective:   No results found. Recent Labs    10/20/19 0531  WBC 8.4  HGB 11.8*  HCT 36.9*  PLT 457*   Recent Labs    10/20/19 0531  NA 143  K 3.8  CL 101  CO2 35*  GLUCOSE 98  BUN 11  CREATININE 0.69  CALCIUM 8.8*    Intake/Output Summary (Last 24 hours) at 10/20/2019 1851 Last data filed at 10/20/2019 0517 Gross per 24 hour  Intake --  Output 1125 ml  Net -1125 ml     Physical Exam: Vital Signs Blood pressure (!) 144/75, pulse 82, temperature 98.6 F (37 C), temperature source Oral, resp. rate 20, height 5\' 7"  (1.702 m), weight 95.8 kg, SpO2 90 %. Constitutional: awake, laying in bed supine, NAD HENT: conjugate gaze. Cardiovascular: RRR. Respiratory: very coarse, more than usual and more wheezing than normal/preneb-  GI: soft, NT, ND, (+)BS Skin: MASD with sanguinous drainage-  Psych: Normal mood.  Normal behavior. Musc: No edema in extremities.  No tenderness in extremities. Neurologic: Alert Motor:  Left lower extremity: 2/5 proximal distal, unchanged Right lower extremity: 1/5 proximal distal- LE exam unchanged.   Assessment/Plan: 1. Functional deficits secondary to lumbar spinal stenosis with neurogenic claudication which require 3+ hours per day of interdisciplinary therapy in a comprehensive inpatient rehab setting.  Physiatrist is providing  close team supervision and 24 hour management of active medical problems listed below.  Physiatrist and rehab team continue to assess barriers to discharge/monitor patient progress toward functional and medical goals  Care Tool:  Bathing    Body parts bathed by patient: Chest, Abdomen, Face   Body parts bathed by helper: Buttocks     Bathing assist Assist Level: Maximal Assistance - Patient 24 - 49%     Upper Body Dressing/Undressing Upper body dressing   What is the patient wearing?: Pull over shirt    Upper body assist Assist Level: Moderate Assistance - Patient 50 - 74%    Lower Body Dressing/Undressing Lower body dressing      What is the patient wearing?: Pants, Underwear/pull up     Lower body assist Assist for lower body dressing: 2 Helpers     Toileting Toileting    Toileting assist Assist for toileting: Dependent - Patient 0%     Transfers Chair/bed transfer  Transfers assist     Chair/bed transfer assist level: Dependent - mechanical lift     Locomotion Ambulation   Ambulation assist   Ambulation activity did not occur: Safety/medical concerns  Assist level: 2 helpers Assistive device: Parallel bars Max distance: 5'   Walk 10 feet activity   Assist  Walk 10 feet activity did not occur: Safety/medical concerns        Walk 50 feet activity   Assist Walk 50 feet with 2 turns activity did not occur: Safety/medical  concerns         Walk 150 feet activity   Assist Walk 150 feet activity did not occur: Safety/medical concerns         Walk 10 feet on uneven surface  activity   Assist Walk 10 feet on uneven surfaces activity did not occur: Safety/medical concerns         Wheelchair     Assist Will patient use wheelchair at discharge?: Yes Type of Wheelchair: Manual    Wheelchair assist level: Supervision/Verbal cueing Max wheelchair distance: 57'    Wheelchair 50 feet with 2 turns activity    Assist         Assist Level: Supervision/Verbal cueing   Wheelchair 150 feet activity     Assist      Assist Level: Supervision/Verbal cueing   Blood pressure (!) 144/75, pulse 82, temperature 98.6 F (37 C), temperature source Oral, resp. rate 20, height 5\' 7"  (1.702 m), weight 95.8 kg, SpO2 90 %.    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  Per neurosurgery, follow-up with outpatient after NCS/EMG.  Neurology consulted recommended management of spasticity and spasms with outpatient NCS/EMG  5/3- Asked Neuro to come back for recs- but all they recommended was what we discussed, a ITB pump, which cannot be placed inpt. Didn't address NSU note at all.   Continue CIR 2. Antithrombotics: -DVT/anticoagulation:Lovenox.   4/12- Dopplers (-)- con't lovenox -antiplatelet therapy: Aspirin 81 mg daily 3. Pain Management:Neurontin 100 mg nightly, OxyContin 10 mg every 12 hours, hydrocodone as needed as well as Imitrex for headaches  baclofen to 15 mg TID- with meals and 20 mg QHS, increase nighttime dose to 30mg  on 5/1 with improvement in resting spasms  Tizanidine 2 mg 3 times daily  5/3- changed breakfast time Baclofen to 0600 since  spasms so bad in AM-  4. Mood:Provide emotional support -antipsychotic agents: N/A 5. Neuropsych: This patientiscapable of making decisions on hisown behalf. 6. Skin/Wound Care:place steristrips over right index finger lac.                          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  Foam dressing left knee  Barrier cream MASD on sacrum 7. Fluids/Electrolytes/Nutrition:Routine in and outs  Calcium 8.5 on 4/26-started calcium supplement, labs ordered for tomorrow 8. Chronic restless leg syndrome. Sinemet 10-100 mg twice daily.  See #3 9. 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 OSA with CPAP/2 L chronic oxygen at home. Continue inhalers 11. 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 Follow-up outpatient 12. Diabetes mellitus. Hemoglobin A1c 5.8. SSI. Changed to [facility] HS for CBG and SSI  HbA1c 5.8- 13. Neurogenic bowel and bladder. Check PVR  Incontinent BMs 14. Hypothyroidism.  Continue Synthroid 15. Chronic diastolic congestive heart failure. Monitor for any signs of fluid overload. Check regular weights   Filed Weights   10/17/19 0642 10/18/19 0507 10/19/19 0513  Weight: 95.7 kg 95.5 kg 95.8 kg   Stable on 5/2 16. Hyperlipidemia. Zocor 17. Code status: Changed to DNR as per patient's wishes. 18. Leukocytosis - pneumonia- Resolved 19. Wheezing  Resolved on abx, scheduled umeclidinium  4/28- pulmonary saw pt- maximized meds 20 Spasticity vs tone in hamstrings, chronically WC bound may be contracted  See #3 21. Neurogenic bladder  4/28-started Flomax 0.4 mg daily and place foley per wishes of pt.   5/3- will increase flomax to try and see if  will void once foley try to remove.  22.  Labile blood pressure  Monitor for trend   I spent a total of 30 minutes on total care today, calling consults and d/w Neurology as well as reading notes, and writing note   LOS: 24 days A FACE TO [place] 10/20/2019, 6:51 PM

## 2019-10-21 ENCOUNTER — Inpatient Hospital Stay (HOSPITAL_COMMUNITY): Payer: Medicare Other | Admitting: *Deleted

## 2019-10-21 ENCOUNTER — Inpatient Hospital Stay (HOSPITAL_COMMUNITY): Payer: Medicare Other | Admitting: Occupational Therapy

## 2019-10-21 ENCOUNTER — Inpatient Hospital Stay (HOSPITAL_COMMUNITY): Payer: Medicare Other | Admitting: Physical Therapy

## 2019-10-21 ENCOUNTER — Inpatient Hospital Stay (HOSPITAL_COMMUNITY): Payer: Medicare Other

## 2019-10-21 MED ORDER — BACLOFEN 20 MG PO TABS
20.0000 mg | ORAL_TABLET | Freq: Three times a day (TID) | ORAL | Status: DC
  Administered 2019-10-21 – 2019-10-23 (×6): 20 mg via ORAL
  Filled 2019-10-21 (×6): qty 1

## 2019-10-21 MED ORDER — ALBUTEROL SULFATE (2.5 MG/3ML) 0.083% IN NEBU
2.5000 mg | INHALATION_SOLUTION | Freq: Three times a day (TID) | RESPIRATORY_TRACT | Status: DC
  Administered 2019-10-21 – 2019-10-24 (×8): 2.5 mg via RESPIRATORY_TRACT
  Filled 2019-10-21 (×8): qty 3

## 2019-10-21 NOTE — Progress Notes (Signed)
RT NOTES: Patient found on room air, sats 86%. Placed on 3lpm nasal cannula. Sats now 92%. Will continue to monitor.

## 2019-10-21 NOTE — Plan of Care (Signed)
  Problem: SCI BOWEL ELIMINATION Goal: RH STG MANAGE BOWEL WITH ASSISTANCE Description: STG Manage Bowel with min Assistance. Outcome: Progressing Goal: RH STG SCI MANAGE BOWEL PROGRAM W/ASSIST OR AS APPROPRIATE Description: STG SCI Manage bowel program w/ min assist or as appropriate. Outcome: Progressing   Problem: RH SAFETY Goal: RH STG ADHERE TO SAFETY PRECAUTIONS W/ASSISTANCE/DEVICE Description: STG Adhere to Safety Precautions With mod I Assistance/Device. Outcome: Progressing   Problem: RH PAIN MANAGEMENT Goal: RH STG PAIN MANAGED AT OR BELOW PT'S PAIN GOAL Description: < 4 Outcome: Progressing

## 2019-10-21 NOTE — Progress Notes (Signed)
[place] PHYSICAL MEDICINE & REHABILITATION PROGRESS NOTE    Subjective:  Pt reports a little SOB this AM- thinks occurs when has spasms- it causes "compression of my diaphragm" and makes me feel like can't catch my breath  When it occurs.   Got baclofen around 6am, but spasms still a big issue.  Also notes, L ankle hurts real bad when has a spasm.   ROS:  Pt denies SOB, abd pain, CP, N/V/C/D, and vision changes   Objective:   No results found. Recent Labs    10/20/19 0531  WBC 8.4  HGB 11.8*  HCT 36.9*  PLT 457*   Recent Labs    10/20/19 0531  NA 143  K 3.8  CL 101  CO2 35*  GLUCOSE 98  BUN 11  CREATININE 0.69  CALCIUM 8.8*    Intake/Output Summary (Last 24 hours) at 10/21/2019 0832 Last data filed at 10/21/2019 0419 Gross per 24 hour  Intake -  Output 1200 ml  Net -1200 ml     Physical Exam: Vital Signs Blood pressure (!) 145/74, pulse 79, temperature 98.2 F (36.8 C), resp. rate 18, height 5\' 7"  (1.702 m), weight 98.7 kg, SpO2 91 %. Constitutional: awake, alert, on CPAP, asking ot stay on it, NAD HENT: conjugate gaze. Cardiovascular: RRR Respiratory: prenebs- sounds much better- less coarse, less wheezing- is mild this AM GI: Soft, NT, ND, (+)BS  Skin: MASD with sanguinous drainage- on CPAP cannot assess Psych: frustrated over weakness Musc: No edema in extremities.  No tenderness in extremities. Neurologic: Alert Motor:  Left lower extremity: 2/5 proximal distal, unchanged Right lower extremity: 1/5 proximal distal- LE exam unchanged.   Assessment/Plan: 1. Functional deficits secondary to lumbar spinal stenosis with neurogenic claudication which require 3+ hours per day of interdisciplinary therapy in a comprehensive inpatient rehab setting.  Physiatrist is providing close team supervision and 24 hour management of active medical problems listed below.  Physiatrist and rehab team continue to assess barriers to discharge/monitor patient progress  toward functional and medical goals  Care Tool:  Bathing    Body parts bathed by patient: Chest, Abdomen, Face   Body parts bathed by helper: Buttocks     Bathing assist Assist Level: Maximal Assistance - Patient 24 - 49%     Upper Body Dressing/Undressing Upper body dressing   What is the patient wearing?: Pull over shirt    Upper body assist Assist Level: Moderate Assistance - Patient 50 - 74%    Lower Body Dressing/Undressing Lower body dressing      What is the patient wearing?: Pants, Underwear/pull up     Lower body assist Assist for lower body dressing: 2 Helpers     Toileting Toileting    Toileting assist Assist for toileting: Dependent - Patient 0%     Transfers Chair/bed transfer  Transfers assist     Chair/bed transfer assist level: Dependent - mechanical lift     Locomotion Ambulation   Ambulation assist   Ambulation activity did not occur: Safety/medical concerns  Assist level: 2 helpers Assistive device: Parallel bars Max distance: 5'   Walk 10 feet activity   Assist  Walk 10 feet activity did not occur: Safety/medical concerns        Walk 50 feet activity   Assist Walk 50 feet with 2 turns activity did not occur: Safety/medical concerns         Walk 150 feet activity   Assist Walk 150 feet activity did not occur: Safety/medical concerns  Walk 10 feet on uneven surface  activity   Assist Walk 10 feet on uneven surfaces activity did not occur: Safety/medical concerns         Wheelchair     Assist Will patient use wheelchair at discharge?: Yes Type of Wheelchair: Manual    Wheelchair assist level: Supervision/Verbal cueing Max wheelchair distance: 42'    Wheelchair 50 feet with 2 turns activity    Assist        Assist Level: Supervision/Verbal cueing   Wheelchair 150 feet activity     Assist      Assist Level: Supervision/Verbal cueing   Blood pressure (!) 145/74, pulse  79, temperature 98.2 F (36.8 C), resp. rate 18, height 5\' 7"  (1.702 m), weight 98.7 kg, SpO2 91 %.    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  Per neurosurgery, follow-up with outpatient after NCS/EMG.  Neurology consulted recommended management of spasticity and spasms with outpatient NCS/EMG  5/3- Asked Neuro to come back for recs- but  they recommended was what we discussed, a ITB pump, which cannot be placed inpt. Didn't address NSU note at all.   5/4- will get MRI of cervical spine- since cannot find anything else to focus on-   Continue CIR 2. Antithrombotics: -DVT/anticoagulation:Lovenox.   4/12- Dopplers (-)- con't lovenox -antiplatelet therapy: Aspirin 81 mg daily 3. Pain Management:Neurontin 100 mg nightly, OxyContin 10 mg every 12 hours, hydrocodone as needed as well as Imitrex for headaches  baclofen to 15 mg TID- with meals and 20 mg QHS, increase nighttime dose to 30mg  on 5/1 with improvement in resting spasms  Tizanidine 2 mg 3 times daily  5/3- changed breakfast time Baclofen to 0600 since spasms so bad in AM-   5/4- increase baclofen to 20 mg TID and 30 mg QHS 4. Mood:Provide emotional  support -antipsychotic agents: N/A 5. Neuropsych: This patientiscapable of making decisions on hisown behalf. 6. Skin/Wound Care:place steristrips over right index finger lac.                          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  Foam dressing left knee  Barrier cream MASD on sacrum  5/3- using Prevalon boots 7. Fluids/Electrolytes/Nutrition:Routine in and outs  Calcium 8.5 on 4/26-started calcium supplement, labs ordered for tomorrow 8. Chronic restless leg syndrome. Sinemet 10-100 mg twice daily.  See #3 9. 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 OSA with CPAP/2 L chronic oxygen at home. Continue inhalers 11. 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 Follow-up outpatient 12. Diabetes mellitus. Hemoglobin A1c 5.8. SSI. Changed to [facility] HS for CBG and SSI  HbA1c 5.8- 13. Neurogenic bowel and bladder. Check PVR  Incontinent BMs 14. Hypothyroidism.  Continue Synthroid 15. Chronic diastolic congestive heart failure. Monitor for any signs of fluid overload. Check regular weights   Filed Weights   10/18/19 0507 10/19/19 0513 10/21/19 0500  Weight: 95.5 kg 95.8 kg 98.7 kg   5/4- up to 98.7 kg- up 3 kg in a 2 days- will monitor 16. Hyperlipidemia. Zocor 17. Code status: Changed to DNR as per patient's wishes. 18. Leukocytosis - pneumonia- Resolved 19. Wheezing  Resolved on abx, scheduled umeclidinium  4/28- pulmonary saw pt- maximized meds  5/4- improved this AM- feeling SOB when has spasms- thinks it affects diaphragm- can see that 20 Spasticity vs tone in hamstrings, chronically WC bound may be contracted  See #3 21. Neurogenic  bladder  4/28-started Flomax 0.4 mg daily and place foley per wishes of pt.   5/3- will increase flomax to try and see if will void once foley try to remove.  22.  Labile blood pressure  Monitor for trend  Spoke to wife- spent 30 minutes on visit- including calling wife.   LOS: 25 days A FACE TO [place] 10/21/2019, 8:32 AM

## 2019-10-21 NOTE — Progress Notes (Signed)
Occupational Therapy Session Note  Patient Details  Name: JOHNATHA GAUTIER MRN: [id number] Date of Birth: 1940-06-26  Today's Date: 10/21/2019 OT Individual Time: 1130-1153 OT Individual Time Calculation (min): 23 min    Short Term Goals: Week 4:  OT Short Term Goal 1 (Week 4): STG=LTG secondary to ELOS  Skilled Therapeutic Interventions/Progress Updates:    Pt resting in bed upon arrival.  Initial focus on bed mobility and LB dressing tasks at bed level with plan to get up in w/c.  RN notified pt he would be going for a CT later and needs to remain in bed. Ted hose donned and pt rolled R<>L to facilitate donning pants.  Pt requires mod A for rolling R<>L to pull pants over hips.  Pt with numerous LLE spasms during session. Transporter arrived to take pt for CT.   Therapy Documentation Precautions:  Precautions Precautions: Fall Restrictions Weight Bearing Restrictions: No Pain: Pain Assessment Pain Scale: 0-10 Pain Score: 8 with spasms RN aware and meds admin during session   Therapy/Group: Individual Therapy  [name] 10/21/2019, 12:00 PM

## 2019-10-21 NOTE — Progress Notes (Signed)
Physical Therapy Session Note  Patient Details  Name: BRONX MCINDOE MRN: [id number] Date of Birth: Sep 18, 1940  Today's Date: 10/21/2019 PT Individual Time: 0915-0930 PT Individual Time Calculation (min): 15 min  PT Missed Time: 60 min Missed Time Reason: patient fatigue  Short Term Goals: Week 4:  PT Short Term Goal 1 (Week 4): Pt will complete bed mobility with assist x 1 PT Short Term Goal 2 (Week 4): Pt will complete least restrictive transfer with assist x 1 PT Short Term Goal 3 (Week 4): Pt will perform w/c mobility x 100 ft at Supervision level  Skilled Therapeutic Interventions/Progress Updates:    Pt received seated in bed, reports feeling fatigued this AM due to not sleeping well last night because of ongoing BLE spasms. No complaints of pain. Pt on 3L O2 at rest, SpO2 91%. Pt initially agreeable to get up to w/c to attempt to eat breakfast but falls asleep in bed while discussing transfer. Pt reports he does want to eat breakfast, attempt to raise HOB in order to better position patient to eat breakfast at bed level. Pt has increase in spasms with HOB elevated and requests to return to semi-reclined position. Returned pt to semi-reclined position in bed and he falls asleep again. Pt missed 60 min of scheduled therapy session due to fatigue.   Therapy Documentation Precautions:  Precautions Precautions: Fall Restrictions Weight Bearing Restrictions: No    Therapy/Group: Individual Therapy  Excell Seltzer, PT, DPT  10/21/2019, 10:10 AM

## 2019-10-21 NOTE — Progress Notes (Signed)
Occupational Therapy Session Note  Patient Details  Name: [name] MRN: [id number] Date of Birth: 1941/06/02  Today's Date: 10/21/2019 OT Individual Time: 0150-0245 OT Individual Time Calculation (min): 55 min  and Today's Date: 10/21/2019 OT Missed Time: 20 Minutes Missed Time Reason: Other (comment)(pt finishing up lunch)   Short Term Goals: Week 3:  OT Short Term Goal 1 (Week 3): STG=LTG secondary to ELOS  Skilled Therapeutic Interventions/Progress Updates:  Pt received supine in bed with wife present on 3 L O2  requesting to finish up lunch; returned 20 mins later with pt agreeable to session. Pt required +2 with use of maxi move to transfer OOB. Pt required MAX A to roll R<>L to place lift pad d/t pain and frequent spasms. Dependent transfer to w/c via maximove. Decreased O2 during session to reassess need for supplemental O2 during w/c propulsion to therapy gym with pt on RA. Pt did desaturate to 86% with OTA increasing pt back to 2L during remainder of session. Pt completed seated dynamic reaching therapeutic activity from w/c via dynavision. Pt completed 2 min trial with avg score of 24 and avg reaction time 5.0. [name] Pt completed 2nd trial for 1 min d/t fatigue with score of 18 and avg reaction time of 3.33. Noted pt required assist to maintain unsupported sitting in w/c during both trials.Pt complete household distance w/c propulsion back to room with pt requesting OTA to take over. Pt left up in w/c with wife present, on 2L O2 ( RN aware) alarm belt activated and all needs within reach.   Therapy Documentation Precautions:  Precautions Precautions: Fall Restrictions Weight Bearing Restrictions: No General: General OT Amount of Missed Time: 20 Minutes Vital Signs: Therapy Vitals Temp: 98.9 F (37.2 C) Temp Source: Oral Pulse Rate: 79 Resp: 15 BP: 124/64 Patient Position (if appropriate): Sitting Oxygen Therapy SpO2: 94 % Pain: Pt reports unrated pain in L ankle during  mobility; provided increased rest breaks and repositioning as pain mgmt strategy.   Therapy/Group: Individual Therapy  [name] 10/21/2019, 3:39 PM

## 2019-10-21 NOTE — Patient Care Conference (Signed)
Inpatient RehabilitationTeam Conference and Plan of Care Update Date: 10/21/2019   Time: 11:15 AM   Patient Name: [name]      Medical Record Number: [id number]  Date of Birth: August 27, 1940 Sex: Male         Room/Bed: 4M07C/4M07C-01 Payor Info: Payor: Marine scientist / Plan: [employer] MEDICARE / Product Type: *No Product type* /    Admit Date/Time:  09/26/2019  1:13 PM  Primary Diagnosis:  Neurogenic claudication due to lumbar spinal stenosis  Patient Active Problem List   Diagnosis Date Noted  . Labile blood pressure   . Spasticity   . Muscle spasm   . Hypoxia   . Supplemental oxygen dependent   . Neurogenic bladder   . Chronic diastolic congestive heart failure ([place])   . Neurogenic bowel   . Hypocalcemia   . Spinal stenosis of lumbar region   . Cervical myelopathy ([place]) 09/26/2019  . Diabetes mellitus type 2, controlled, with complications ([place]) [id number]  . Obese 09/17/2019  . Thoracic aortic aneurysm ([place]) 09/17/2019  . Bowel incontinence 09/17/2019  . Bladder incontinence 09/17/2019  . Right hemiparesis ([place]) 09/17/2019  . Acute renal failure (ARF) ([place]) 09/17/2019  . Left hemiparesis ([place]) 09/17/2019  . Chronic respiratory failure with hypoxia ([place]) 09/17/2019  . Syncope and collapse 09/17/2019  . Chronic diastolic CHF (congestive heart failure) ([place]) 09/04/2019  . PVD (peripheral vascular disease) ([place]) 02/14/2019  . Essential hypertension 02/13/2019  . Non-insulin treated type 2 diabetes mellitus ([place]) 02/13/2019  . Severe sepsis ([place]) 01/17/2019  . Community acquired pneumonia of right lower lobe of lung 01/17/2019  . Postural urinary incontinence 05/20/2018  . Myelopathy concurrent with and due to spinal stenosis of cervical region ([place]) 05/20/2018  . Myoclonic jerking 05/20/2018  . 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 History of respiratory failure 08/18/2017  . Hypokalemia 08/18/2017  . Leukocytosis 08/18/2017   . Muscle atrophy of lower extremity 08/09/2017  . Bowel and bladder incontinence 08/09/2017  . Right-sided muscle weakness 08/09/2017  . Neuropathy 08/09/2017  . 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/2017  . Abnormality of gait 07/12/2017  . Myoclonic jerkings, 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/2017  . Acquired right foot drop 07/12/2017  . UTI (urinary tract infection) due to Enterococcus 07/12/2017  . Pressure ulcer 06/21/2017  . Acute cystitis 06/20/2017  . Generalized weakness 06/20/2017  . Dehydration 06/20/2017  . Dyspnea 12/26/2016  . Chronic cough 12/26/2016  . Hypersomnia with sleep apnea 10/30/2016  . CSA (central sleep apnea) 10/30/2016  . Wheezing symptom 10/30/2016  . 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 RLS (restless legs syndrome) 08/25/2014  . Primary gout 08/25/2014  . OSA on CPAP 08/25/2014  . Severe obesity (BMI >= 40) ([place]) 08/25/2014  . Obesity (BMI 30-39.9) 02/18/2013  . 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 S/P TKR (total knee replacement) 11/29/2012  . History of thyroid cancer     Expected Discharge Date: Expected Discharge Date: (TBD)  Team Members Present: Physician leading conference: Dr. Courtney Heys Care Coodinator Present: Erlene Quan, BSW;Genie Junah Yam, RN, MSN Nurse Present: Debroah Loop, RN PT Present: Excell Seltzer, PT OT Present: Willeen Cass, OT;Roanna Epley, COTA PPS Coordinator present : Gunnar Fusi, Novella Olive, PT     Current Status/Progress Goal Weekly Team Focus  Bowel/Bladder   Patient has a foley catheter, patient is incontinene of bowel and bladder  Patient to remain continent of Bowel and  skin  around foley catheter remain intact  Assess bowel and bladder Q shift and as needed   Swallow/Nutrition/ Hydration             ADL's   bathing/dressing at bed level and w/c-max A; transfers are now with Maxi Move; bed mobility-max Al sitting  balance-min A  supervision overall, min A LB dressing (to be downgraded to mod A overall)  activity tolernace, functional tranfsers, sitting balance, education   Mobility   max A to +2 bed mobility, transfers via maxi move, Supervision w/c mobility up to 50 ft  mod A overall  endurance, sitting balance, UE/LE strengthening as able   Communication             Safety/Cognition/ Behavioral Observations            Pain   Patient c/o of headache, left ankle pain, muscle spasm. Patient has scheduled pain meds, prn Norco and spasm medications scheduled  Pain less than or= 3  Assess apin q shift and as needed and provide medications and relaxations techniques as needed   Skin   Patient has wound left ankle, abrasion and skin tear on pinkey finger and MASD to groin and buttocks  Assess and prevent further breakdown and promote healing and provide treatment as needed  Assess skin q shift and as needed    Rehab Goals Rehab Goals Revised: Pt d/c date is currently on hold due to medical reasons *See Care Plan and progress notes for long and short-term goals.     Barriers to Discharge  Current Status/Progress Possible Resolutions Date Resolved   Nursing                  PT  Medical stability                 OT                  SLP                SW Decreased caregiver support;Lack of/limited family support              Discharge Planning/Teaching Needs:  Pt to d/c to home with his wife who will provide 24/7 care; aide service- 3hrs in the AM/PM  Family education as recommended by therapy   Team Discussion: MD spoke with neurosurgery and will get orders for CT myelogram, increased baclofen, talked to wife.  RN spasms, O2 sats low, on 3 L O2, L ankle wound with drsg change, moisture on buttocks, has foley in place.  PT maximove for transfers d/t spasms.  OT max/total for B/D.   Revisions to Treatment Plan:      Medical Summary Current Status: back on O2-3L; was on yesterday-  spasms are  making him miserable-incontinent of stool; pressure ulcer; moisture on butt- looking OK; L ankle no better; has foley Weekly Focus/Goal: PT- spasms so bad, couldn't transfer with steady- hasd to use maximove-fell asleep this AM- keeps getting worse daily for PT  Barriers to Discharge: Decreased family/caregiver support;Home enviroment access/layout;Neurogenic Bowel & Bladder;Incontinence;Medical stability;New oxygen;Weight bearing restrictions;Wound care;Weight   Possible Resolutions to Barriers: OT- max-total for lb dressing/bathing; now bed level- worse than was when got here   Continued Need for Acute Rehabilitation Level of Care: The patient requires daily medical management by a physician with specialized training in physical medicine and rehabilitation for the following reasons: Direction of a multidisciplinary physical rehabilitation program to maximize functional independence : Yes Medical  management of patient stability for increased activity during participation in an intensive rehabilitation regime.: Yes Analysis of laboratory values and/or radiology reports with any subsequent need for medication adjustment and/or medical intervention. : Yes   I attest that I was present, lead the team conference, and concur with the assessment and plan of the team.   Retta Diones 10/21/2019, 8:13 PM   Team conference was held via web/ teleconference due to [place] - 19

## 2019-10-21 NOTE — Progress Notes (Signed)
Occupational Therapy Session Note  Patient Details  Name: [name] MRN: [id number] Date of Birth: 1941-06-17  Today's Date: 10/21/2019 OT Individual Time: 0705-0720 OT Individual Time Calculation (min): 15 min  and Today's Date: 10/21/2019 OT Missed Time: 15 Minutes Missed Time Reason: Patient fatigue;Pain   Short Term Goals: Week 4:  OT Short Term Goal 1 (Week 4): STG=LTG secondary to ELOS  Skilled Therapeutic Interventions/Progress Updates:    Upon entering the room, pt supine in bed with CPAP donned. Pt declined OT intervention this session and having spasm after spasm in L LE while therapist is speaking with pt. OT offering to provide stretching but pt declined. RN also reports all medication given at this time. Pt requesting to remain supine in bed. Bed alarm activated and call bell within reach.   Therapy Documentation Precautions:  Precautions Precautions: Fall Restrictions Weight Bearing Restrictions: No General: General OT Amount of Missed Time: 15 Minutes Vital Signs: Therapy Vitals Temp: 98.2 F (36.8 C) Pulse Rate: 79 Resp: 18 BP: (!) 145/74 Patient Position (if appropriate): Lying Oxygen Therapy SpO2: 91 % O2 Device: CPAP Pain: Pain Assessment Pain Scale: 0-10 Pain Score: 7  Pain Type: Acute pain Pain Location: Leg Pain Orientation: Right;Left Pain Descriptors / Indicators: Sharp Pain Frequency: Intermittent Pain Intervention(s): Medication (See eMAR) ADL: ADL Eating: Set up Where Assessed-Eating: Chair Grooming: Setup Where Assessed-Grooming: Sitting at sink Upper Body Bathing: Minimal assistance Where Assessed-Upper Body Bathing: Sitting at sink Lower Body Bathing: Maximal assistance Where Assessed-Lower Body Bathing: Bed level Upper Body Dressing: Minimal assistance Where Assessed-Upper Body Dressing: Sitting at sink Lower Body Dressing: Maximal assistance Where Assessed-Lower Body Dressing: Bed level Toileting: Maximal assistance Where  Assessed-Toileting: Bed level ADL Comments: no urge to void during OT sessions, condom cath in am, brief wet and changed bed level during pm session   Therapy/Group: Individual Therapy  Gypsy Decant 10/21/2019, 7:22 AM

## 2019-10-21 NOTE — Progress Notes (Signed)
Social Work Patient ID: [name], male   DOB: 1940-10-16, 79 y.o.   MRN: [id number]    Pt d/c remains on hold due to medical reasons. Pt progress limited due to health.   SW met with pt and pt wife in room to inform his d/c date remains on hold at this time.   Loralee Pacas, MSW, [place] Office: [phone] Cell: [phone] Fax: [phone]

## 2019-10-22 ENCOUNTER — Inpatient Hospital Stay (HOSPITAL_COMMUNITY): Payer: Medicare Other

## 2019-10-22 ENCOUNTER — Inpatient Hospital Stay (HOSPITAL_COMMUNITY): Payer: Medicare Other | Admitting: Occupational Therapy

## 2019-10-22 ENCOUNTER — Inpatient Hospital Stay (HOSPITAL_COMMUNITY): Payer: Medicare Other | Admitting: Physical Therapy

## 2019-10-22 ENCOUNTER — Ambulatory Visit: Payer: Medicare Other

## 2019-10-22 ENCOUNTER — Inpatient Hospital Stay (HOSPITAL_COMMUNITY): Payer: Medicare Other | Admitting: *Deleted

## 2019-10-22 IMAGING — CT CT CERVICAL SPINE W/O CM
3 series · 9 of 33 positions shown, 11 images · non-contrast
Comparison: none

CLINICAL DATA: Worsening spasticity. Spinal stenosis. Consideration
of baclofen pump.
TECHNIQUE: Contiguous axial images were obtained through the Cervical,
Thoracic, and Lumbar spine after the intrathecal infusion of
infusion. Coronal and sagittal reconstructions were obtained of the
axial image sets.

[Series 5: c_spine 2.0 st · axial · 0.37mm/px · z∈[-149,-149]mm · 1 of 93 slices shown, 2 images]
[im 50/93  soft-tissue]
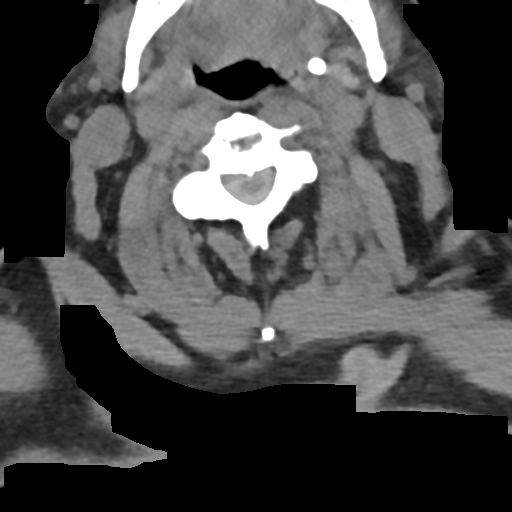
[im 50/93  bone]
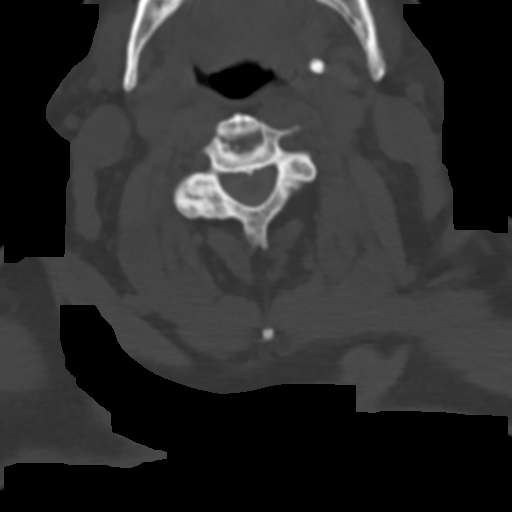

[Series 8: coronal bone · coronal · 0.27mm/px · 3 of 61 slices shown]
[im 13/61  bone]
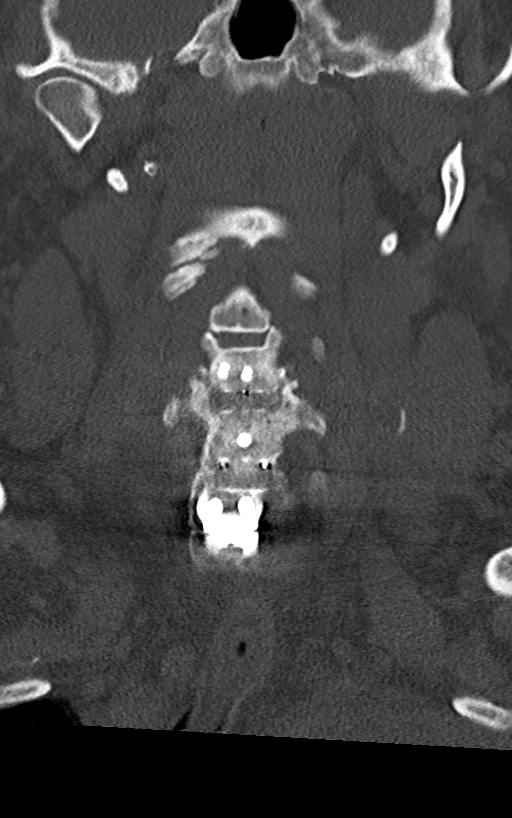
[im 25/61  bone]
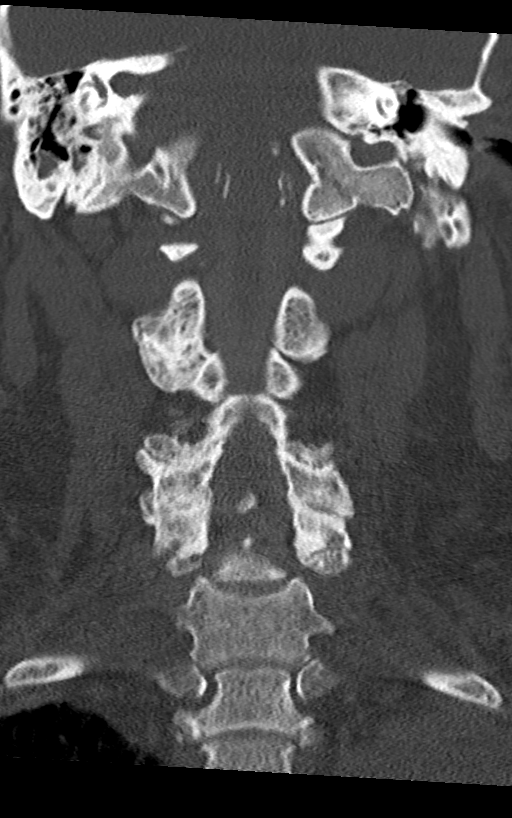
[im 37/61  bone]
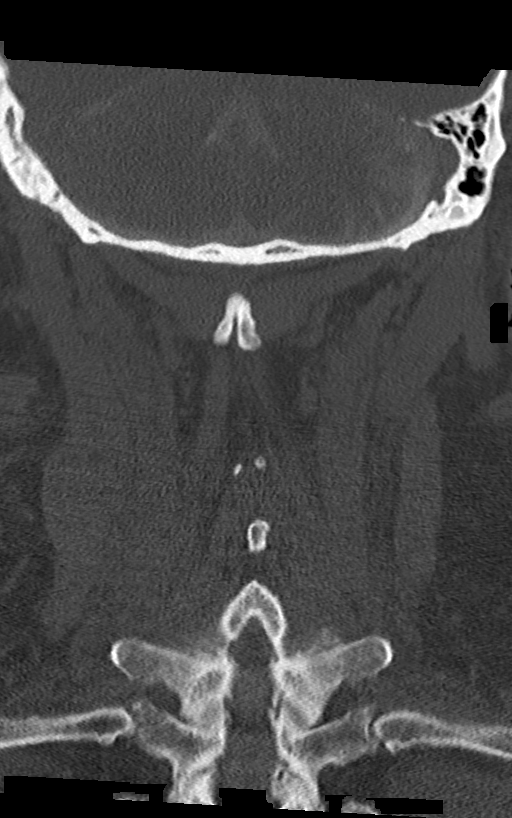

[Series 9: sagittal bone · sagittal · 0.27mm/px · 5 of 61 slices shown, 6 images]
[im 21/61  bone]
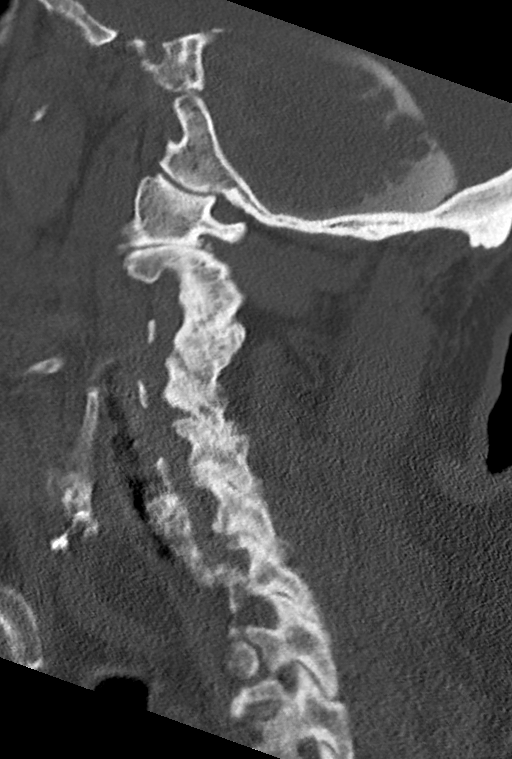
[im 26/61  bone]
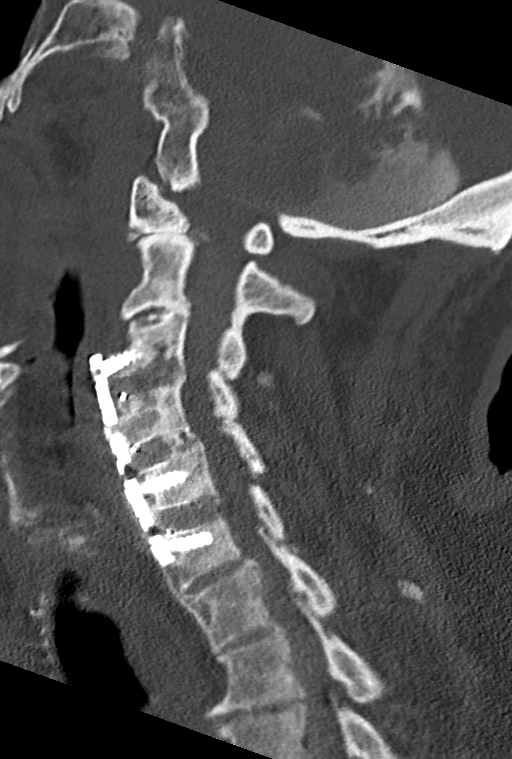
[im 31/61  soft-tissue]
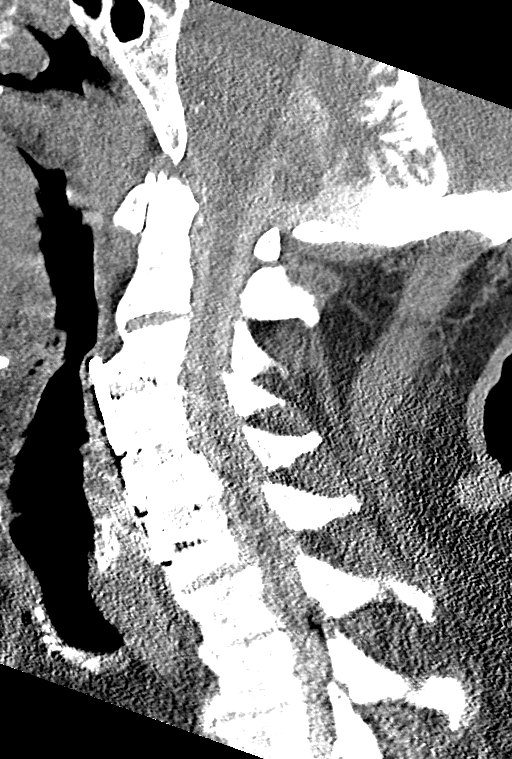
[im 31/61  bone]
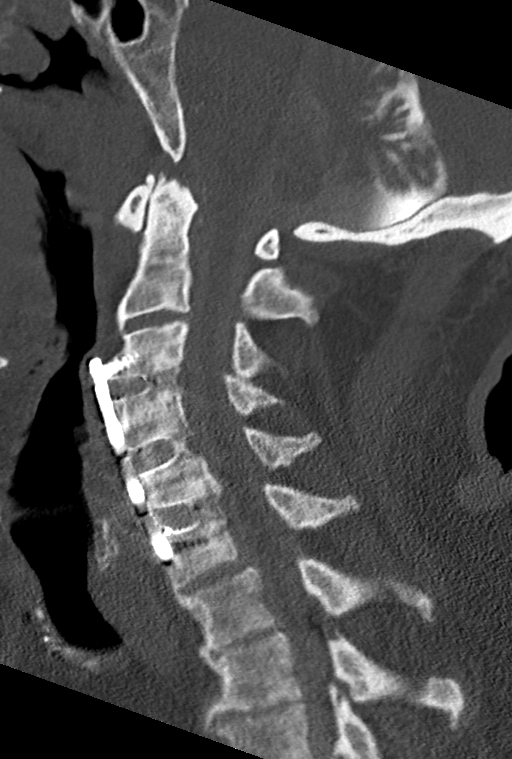
[im 36/61  bone]
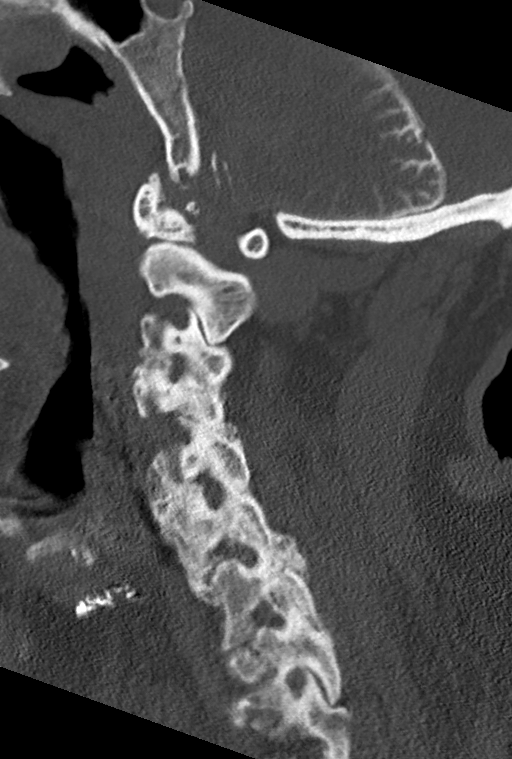
[im 41/61  bone]
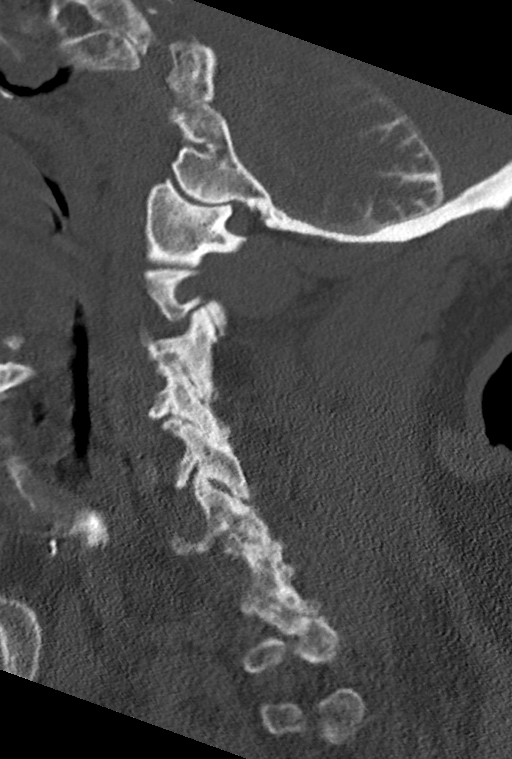

[9 of 33 positions shown; findings below may reference images not displayed]

FLUOROSCOPY TIME:  1.8 minutes.  95.7 mGy air kerma

PROCEDURE:
LUMBAR PUNCTURE FOR CERVICAL LUMBAR AND THORACIC MYELOGRAM

CERVICAL AND LUMBAR AND THORACIC MYELOGRAM

CT CERVICAL MYELOGRAM

CT LUMBAR MYELOGRAM

CT THORACIC MYELOGRAM

Written and oral informed consent was obtained. Consent was obtained
by Dr. MATAN. Time-out was performed.

Patient was positioned prone on the fluoroscopy table. Local
anesthesia was provided with 1% lidocaine without epinephrine after
prepped and draped in the usual sterile fashion. Puncture was
performed at L5-S1 using a 3 1/2 inch 22-gauge spinal needle via
midline approach. Using a single pass through the dura, the needle
was placed within the thecal sac, with return of clear CSF. 10 mL
Omnipaque 300 was injected into the thecal sac, with normal
opacification of the nerve roots and cauda equina consistent with
free flow within the subarachnoid space. Patient cannot stand or or
easily positioned on the table due to spasms and joint flexion in
the lower extremity. Contrast was advanced into the cervical region
in the lateral decubitus position.

I personally performed the lumbar puncture and administered the
intrathecal contrast. I also personally supervised acquisition of
the myelogram images.
FINDINGS: CERVICAL AND LUMBAR MYELOGRAM FINDINGS:

Myelography was only used for positioning of intrathecal contrast,
which flowed freely into the upper cervical spine.

L3-4 severe disc space degeneration with vacuum phenomenon and disc
space obliteration. There is severe stenosis at this level.

CT CERVICAL MYELOGRAM FINDINGS:

Alignment: Mild anterolisthesis C6-7

Vertebrae: C3-C6 ACDF with solid arthrodesis. Bridging osteophytes
ankylosis the C6-7 and C7-T1 spaces. There is also ankylosis across
the right facet and intervertebral space of C2-3.

Cord: Suboptimal intrathecal contrast which has layered since
myelography. Posterior longitudinal ligament ossification at C5
contacts the ventral cord but no effacement of dorsal CSF.

Extraspinal: No evidence of inflammation or mass.  Thyroidectomy.

Disc levels:

C1-2: Advanced right-sided facet osteoarthritis with joint space
narrowing and potential C2 impingement.

C2-3: Ankylosis.  No visible impingement

C3-4: ACDF. Patent canal. Residual ridging causes moderate left
foraminal narrowing.

C4-5: ACDF with solid arthrodesis. Patent spinal canal. The foramina
appear sufficiently patent

C5-6: ACDF. Uncovertebral and facet spurring causes moderate right
foraminal narrowing. Posterior longitudinal ligament ossification
contacts the ventral cord without compression.

C6-7: Facet spurring and anterolisthesis. Disc narrowing. Mild
bilateral foraminal narrowing. Patent spinal canal

C7-T1:Facet spurring and ankylosis. Bridging intervertebral
ankylosis.

CT THORACIC MYELOGRAM FINDINGS:

Alignment: Exaggerated thoracic kyphosis.

Vertebrae: No evidence of fracture or bone lesion.

Cord: Suboptimal intrathecal contrast in the upper thoracic levels.
No cord compression. Facet spurring in the lower thoracic levels
mildly encroaches on the posterolateral cord.

Extra-spinal: Left lower lobe and multi segment left upper lobe
collapse with large pleural effusion. There is also right-sided
atelectasis and volume loss. Cardiomegaly.

Disc levels: Diffuse degenerative disc narrowing and ventral
spurring. Bridging osteophytes are seen at multiple levels,
including T6-7, T8-9, and T10-11. Facet spurs encroach on lower
thoracic foramina without focal high-grade involvement

CT LUMBAR MYELOGRAM FINDINGS:

Segmentation: 5 lumbar type vertebrae.

Alignment: Mild dextroscoliosis. Trace retrolisthesis at L2-3 and
L3-4.

Vertebrae: Extensive vertebral body sclerosis attributed to
degenerative disease. No evidence of fracture or bone lesion.

Extra-spinal: Extensive atherosclerotic plaque on the aorta.

Conus: Tip terminates at L1-2.  No detected swelling

Disc levels:

T12- L1: Spondylosis.  No impingement

L1-L2: Disc narrowing and bulging with endplate ridging. Moderate
spinal stenosis.

L2-L3: Degenerative disc collapse with endplate sclerosis and
posterior ridging. High-grade spinal stenosis. Moderate left more
than right foraminal narrowing

L3-L4: Severe disc degeneration with disc space obliteration and
ridging. Bilateral facet spurring and ligamentum flavum thickening.
Advanced spinal stenosis. Right more than left foraminal impingement

L4-L5: Advanced disc narrowing with right-sided collapse and
endplate ridging. Facet osteoarthritis with bulky spurring on the
right where there is ankylosis. Left more than right subarticular
recess narrowing. Biforaminal impingement.

L5-S1:Disc narrowing and ridging. Right facet osteoarthritis.
Moderate right foraminal narrowing.
IMPRESSION: Lumbar spine:

1. Severe degenerative disease with mild scoliosis and listhesis.
2. L3-4 severe spinal stenosis.
3. L2-3 moderate to advanced spinal stenosis.
4. L1-2 moderate spinal stenosis.
5. Bilateral foraminal narrowings described above.

Cervical spine:

1. Resolved cord compression after ACDF. There is solid arthrodesis
from C3-C6 and degenerative ankylosis at C2-3, C6-7, and C7-T1.
2. Bilateral foraminal narrowings described above.
3. Large left pleural effusion with collapse of the majority of the
left lung.

Thoracic spine:

1. Diffuse degenerative disease without cord compression.
2. Multilevel spondylitic ankylosis.

## 2019-10-22 IMAGING — RF DG MYELOGRAPHY LUMBAR INJ MULTI REGION
8 series · 8 of 8 positions shown · non-contrast
Comparison: none

CLINICAL DATA: Worsening spasticity. Spinal stenosis. Consideration
of baclofen pump.
TECHNIQUE: Contiguous axial images were obtained through the Cervical,
Thoracic, and Lumbar spine after the intrathecal infusion of
infusion. Coronal and sagittal reconstructions were obtained of the
axial image sets.

[Series 1: fluoro_myelogram_singleshot_bw · 0.17mm/px · 1 of 1 slices shown (1 of 6)]
[im 1/1]
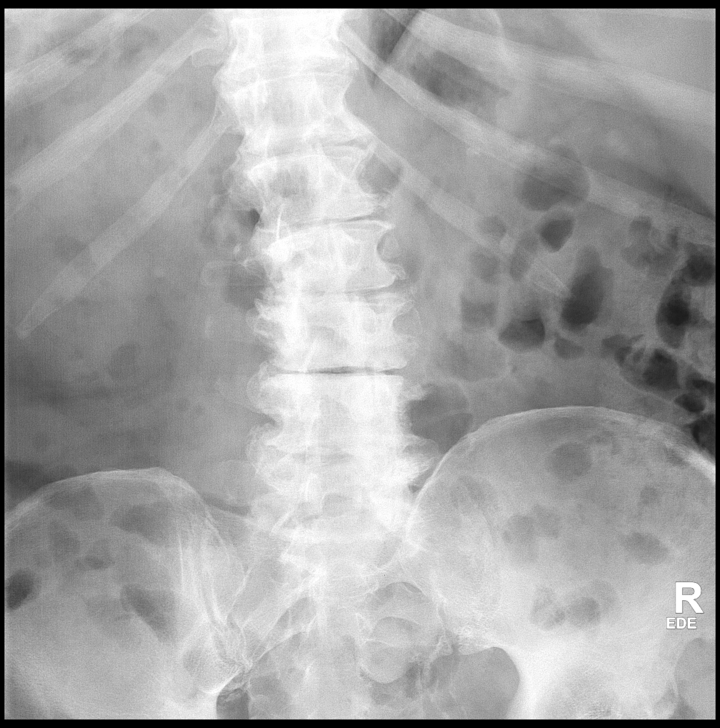

[Series 2: fluoro_myelogram_singleshot_bw · 0.17mm/px · 1 of 1 slices shown (2 of 6)]
[im 1/1]
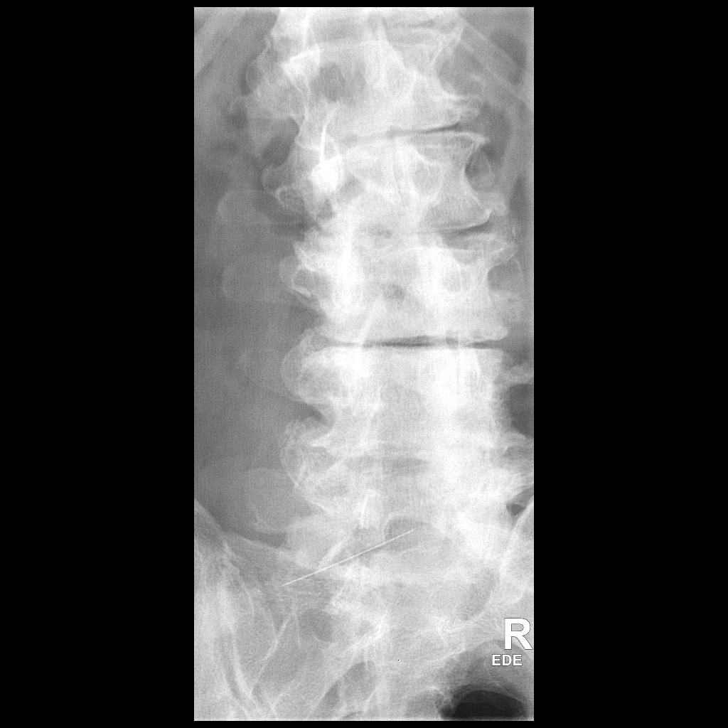

[Series 3: cp_standard · 0.17mm/px · 1 of 1 slices shown (1 of 2)]
[im 1/1]
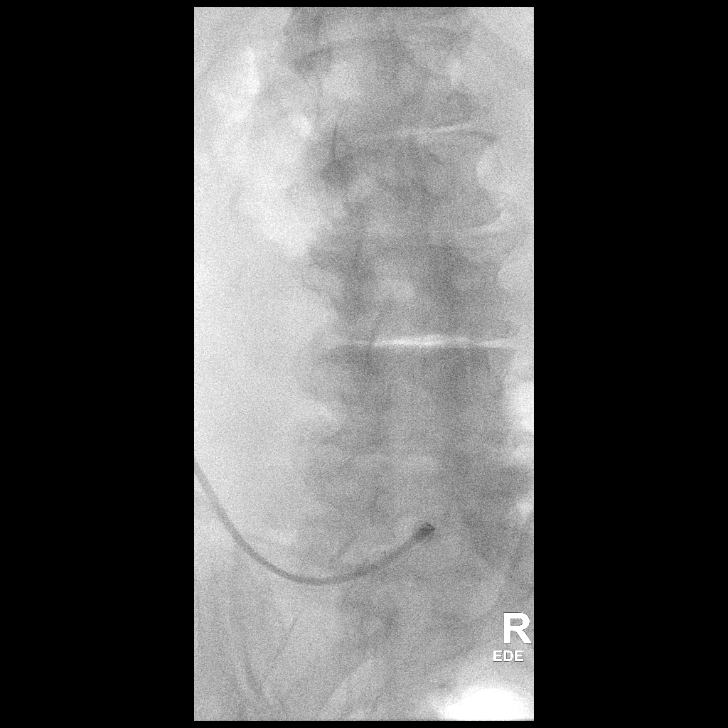

[Series 4: cp_standard · 0.17mm/px · 1 of 1 slices shown (2 of 2)]
[im 1/1]
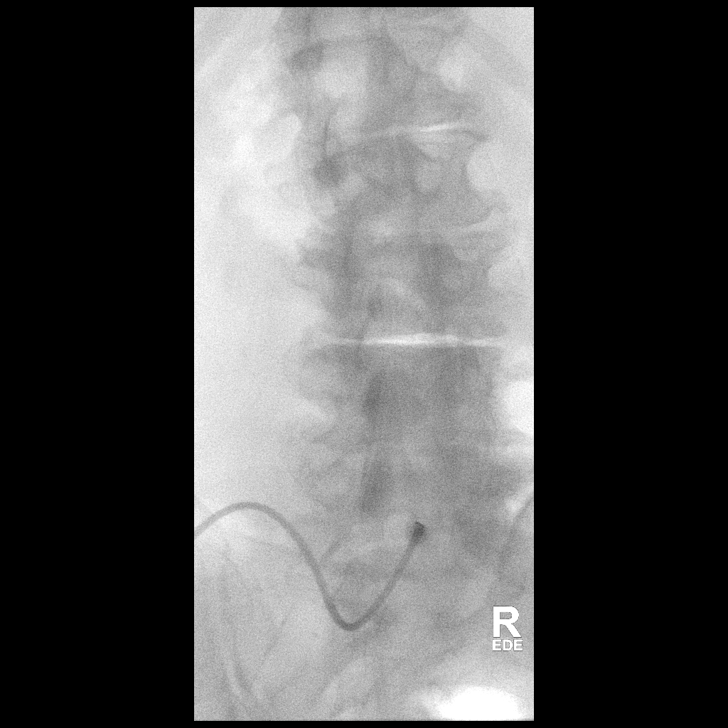

[Series 5: fluoro_myelogram_singleshot_bw · 0.17mm/px · 1 of 1 slices shown (3 of 6)]
[im 1/1]
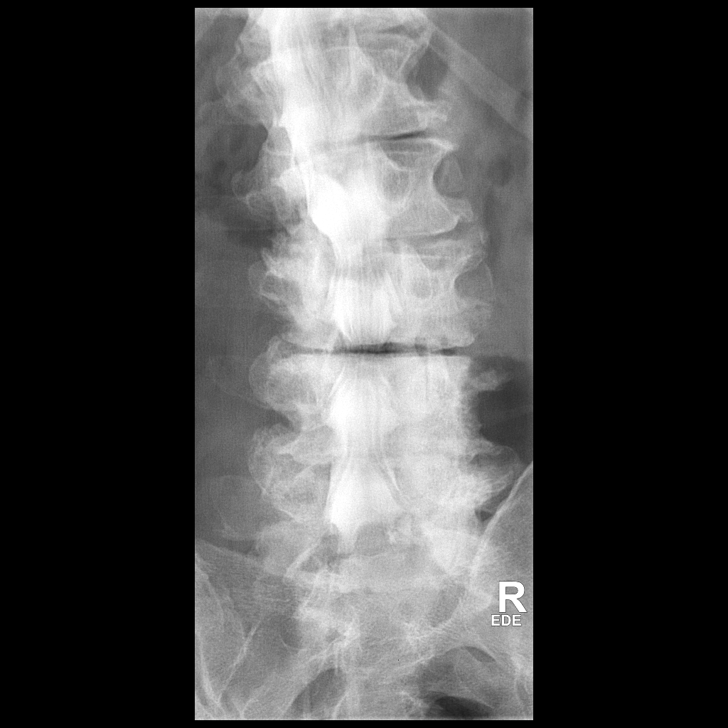

[Series 6: fluoro_myelogram_singleshot_bw · 0.17mm/px · 1 of 1 slices shown (4 of 6)]
[im 1/1]
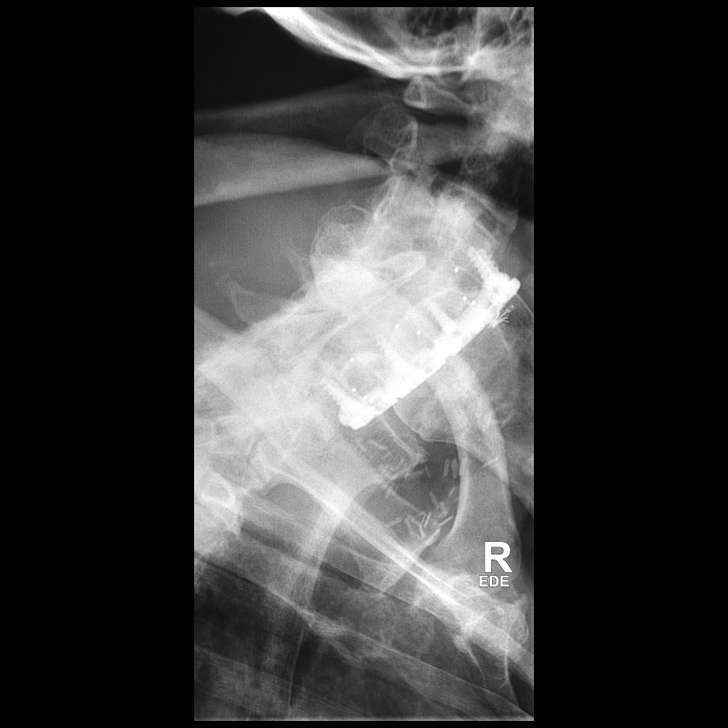

[Series 7: fluoro_myelogram_singleshot_bw · 0.17mm/px · 1 of 1 slices shown (5 of 6)]
[im 1/1]
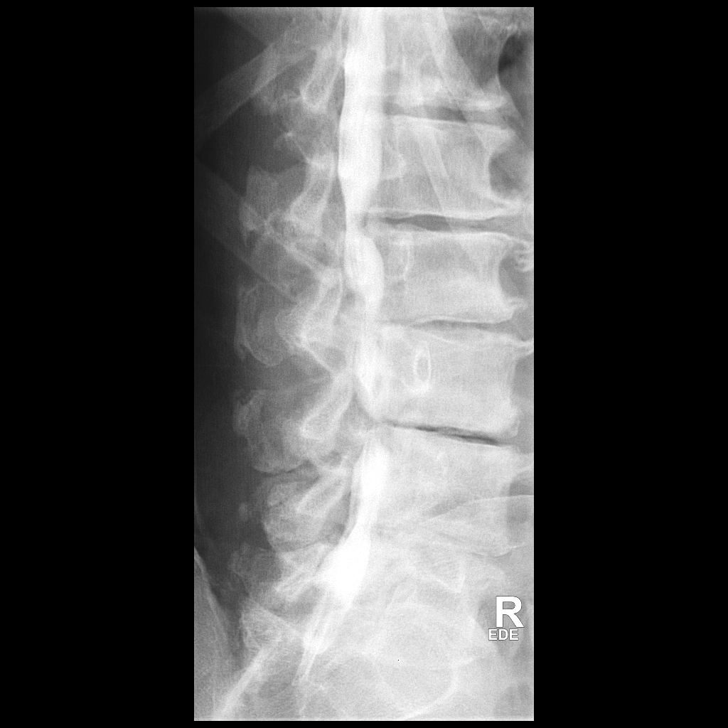

[Series 8: fluoro_myelogram_singleshot_bw · 0.17mm/px · 1 of 1 slices shown (6 of 6)]
[im 1/1]
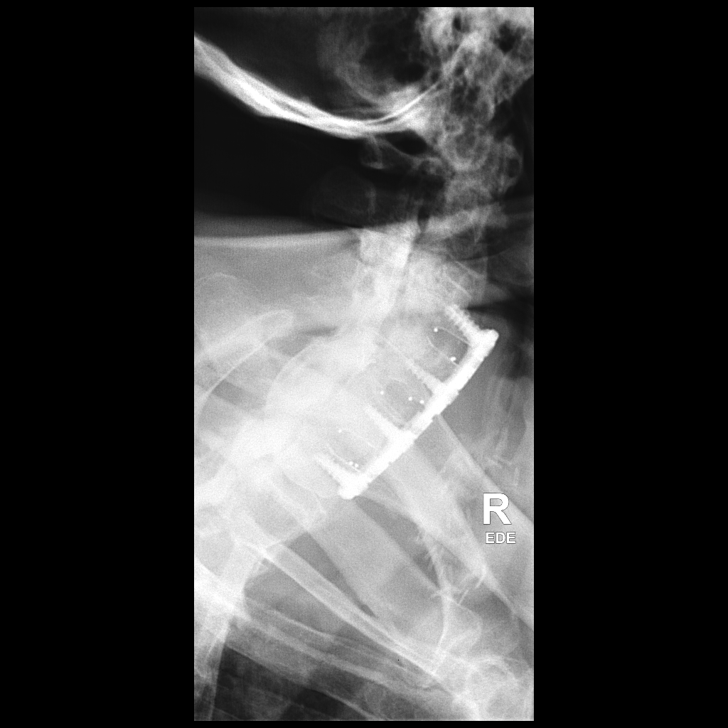

[8 of 8 positions shown; findings below may reference images not displayed]

FLUOROSCOPY TIME:  1.8 minutes.  95.7 mGy air kerma

PROCEDURE:
LUMBAR PUNCTURE FOR CERVICAL LUMBAR AND THORACIC MYELOGRAM

CERVICAL AND LUMBAR AND THORACIC MYELOGRAM

CT CERVICAL MYELOGRAM

CT LUMBAR MYELOGRAM

CT THORACIC MYELOGRAM

Written and oral informed consent was obtained. Consent was obtained
by Dr. MATAN. Time-out was performed.

Patient was positioned prone on the fluoroscopy table. Local
anesthesia was provided with 1% lidocaine without epinephrine after
prepped and draped in the usual sterile fashion. Puncture was
performed at L5-S1 using a 3 1/2 inch 22-gauge spinal needle via
midline approach. Using a single pass through the dura, the needle
was placed within the thecal sac, with return of clear CSF. 10 mL
Omnipaque 300 was injected into the thecal sac, with normal
opacification of the nerve roots and cauda equina consistent with
free flow within the subarachnoid space. Patient cannot stand or or
easily positioned on the table due to spasms and joint flexion in
the lower extremity. Contrast was advanced into the cervical region
in the lateral decubitus position.

I personally performed the lumbar puncture and administered the
intrathecal contrast. I also personally supervised acquisition of
the myelogram images.
FINDINGS: CERVICAL AND LUMBAR MYELOGRAM FINDINGS:

Myelography was only used for positioning of intrathecal contrast,
which flowed freely into the upper cervical spine.

L3-4 severe disc space degeneration with vacuum phenomenon and disc
space obliteration. There is severe stenosis at this level.

CT CERVICAL MYELOGRAM FINDINGS:

Alignment: Mild anterolisthesis C6-7

Vertebrae: C3-C6 ACDF with solid arthrodesis. Bridging osteophytes
ankylosis the C6-7 and C7-T1 spaces. There is also ankylosis across
the right facet and intervertebral space of C2-3.

Cord: Suboptimal intrathecal contrast which has layered since
myelography. Posterior longitudinal ligament ossification at C5
contacts the ventral cord but no effacement of dorsal CSF.

Extraspinal: No evidence of inflammation or mass.  Thyroidectomy.

Disc levels:

C1-2: Advanced right-sided facet osteoarthritis with joint space
narrowing and potential C2 impingement.

C2-3: Ankylosis.  No visible impingement

C3-4: ACDF. Patent canal. Residual ridging causes moderate left
foraminal narrowing.

C4-5: ACDF with solid arthrodesis. Patent spinal canal. The foramina
appear sufficiently patent

C5-6: ACDF. Uncovertebral and facet spurring causes moderate right
foraminal narrowing. Posterior longitudinal ligament ossification
contacts the ventral cord without compression.

C6-7: Facet spurring and anterolisthesis. Disc narrowing. Mild
bilateral foraminal narrowing. Patent spinal canal

C7-T1:Facet spurring and ankylosis. Bridging intervertebral
ankylosis.

CT THORACIC MYELOGRAM FINDINGS:

Alignment: Exaggerated thoracic kyphosis.

Vertebrae: No evidence of fracture or bone lesion.

Cord: Suboptimal intrathecal contrast in the upper thoracic levels.
No cord compression. Facet spurring in the lower thoracic levels
mildly encroaches on the posterolateral cord.

Extra-spinal: Left lower lobe and multi segment left upper lobe
collapse with large pleural effusion. There is also right-sided
atelectasis and volume loss. Cardiomegaly.

Disc levels: Diffuse degenerative disc narrowing and ventral
spurring. Bridging osteophytes are seen at multiple levels,
including T6-7, T8-9, and T10-11. Facet spurs encroach on lower
thoracic foramina without focal high-grade involvement

CT LUMBAR MYELOGRAM FINDINGS:

Segmentation: 5 lumbar type vertebrae.

Alignment: Mild dextroscoliosis. Trace retrolisthesis at L2-3 and
L3-4.

Vertebrae: Extensive vertebral body sclerosis attributed to
degenerative disease. No evidence of fracture or bone lesion.

Extra-spinal: Extensive atherosclerotic plaque on the aorta.

Conus: Tip terminates at L1-2.  No detected swelling

Disc levels:

T12- L1: Spondylosis.  No impingement

L1-L2: Disc narrowing and bulging with endplate ridging. Moderate
spinal stenosis.

L2-L3: Degenerative disc collapse with endplate sclerosis and
posterior ridging. High-grade spinal stenosis. Moderate left more
than right foraminal narrowing

L3-L4: Severe disc degeneration with disc space obliteration and
ridging. Bilateral facet spurring and ligamentum flavum thickening.
Advanced spinal stenosis. Right more than left foraminal impingement

L4-L5: Advanced disc narrowing with right-sided collapse and
endplate ridging. Facet osteoarthritis with bulky spurring on the
right where there is ankylosis. Left more than right subarticular
recess narrowing. Biforaminal impingement.

L5-S1:Disc narrowing and ridging. Right facet osteoarthritis.
Moderate right foraminal narrowing.
IMPRESSION: Lumbar spine:

1. Severe degenerative disease with mild scoliosis and listhesis.
2. L3-4 severe spinal stenosis.
3. L2-3 moderate to advanced spinal stenosis.
4. L1-2 moderate spinal stenosis.
5. Bilateral foraminal narrowings described above.

Cervical spine:

1. Resolved cord compression after ACDF. There is solid arthrodesis
from C3-C6 and degenerative ankylosis at C2-3, C6-7, and C7-T1.
2. Bilateral foraminal narrowings described above.
3. Large left pleural effusion with collapse of the majority of the
left lung.

Thoracic spine:

1. Diffuse degenerative disease without cord compression.
2. Multilevel spondylitic ankylosis.

## 2019-10-22 IMAGING — CT CT T SPINE W/O CM
2 of 3 series · 9 of 33 positions shown, 11 images · non-contrast
Comparison: none

CLINICAL DATA: Worsening spasticity. Spinal stenosis. Consideration
of baclofen pump.
TECHNIQUE: Contiguous axial images were obtained through the Cervical,
Thoracic, and Lumbar spine after the intrathecal infusion of
infusion. Coronal and sagittal reconstructions were obtained of the
axial image sets.

[Series 4: t-spine 2.0 st · axial · 0.34mm/px · z∈[-458,-228]mm · 6 of 151 slices shown, 8 images]
[im 24/151  soft-tissue]
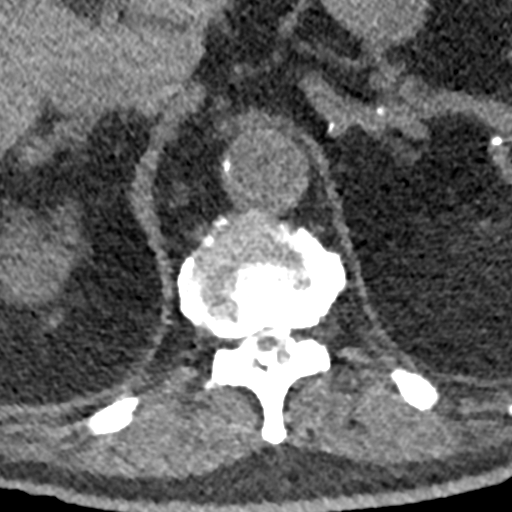
[im 24/151  bone]
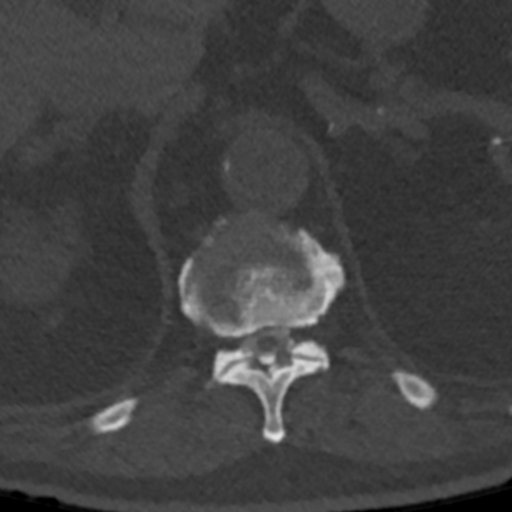
[im 47/151  bone]
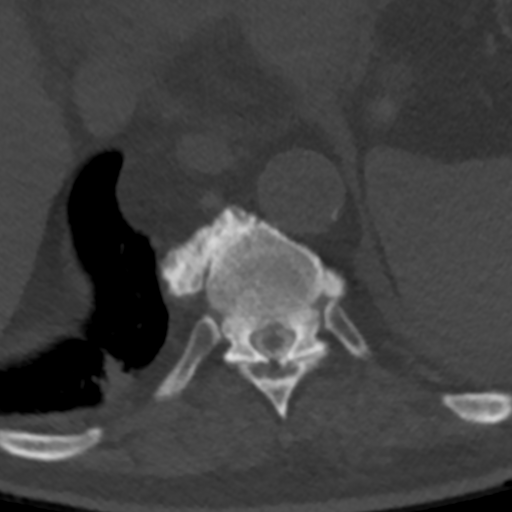
[im 70/151  bone]
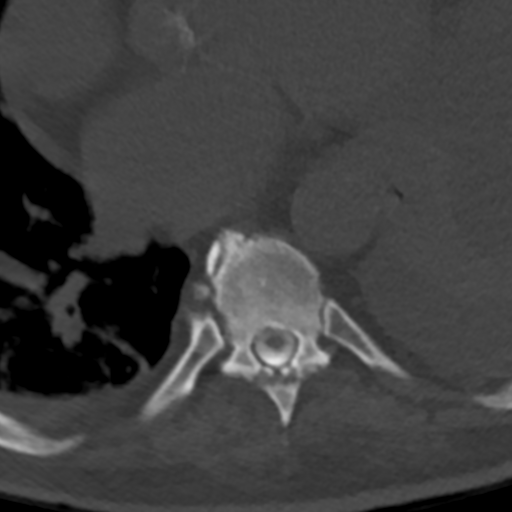
[im 93/151  bone]
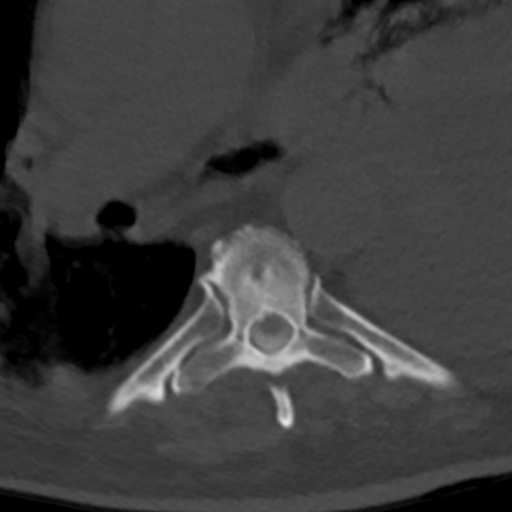
[im 116/151  soft-tissue]
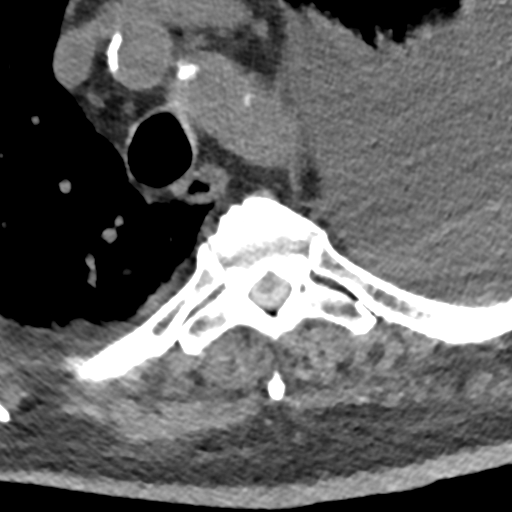
[im 116/151  bone]
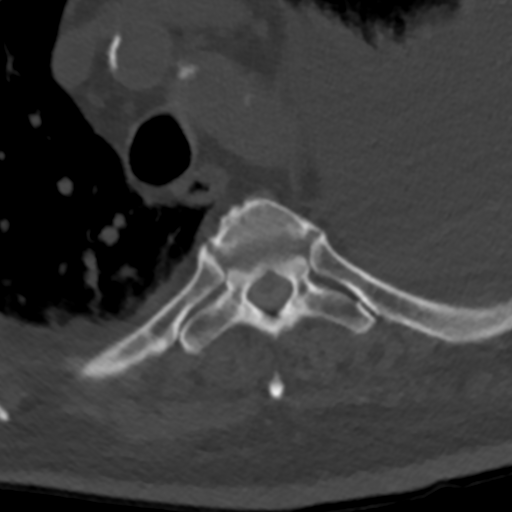
[im 139/151  bone]
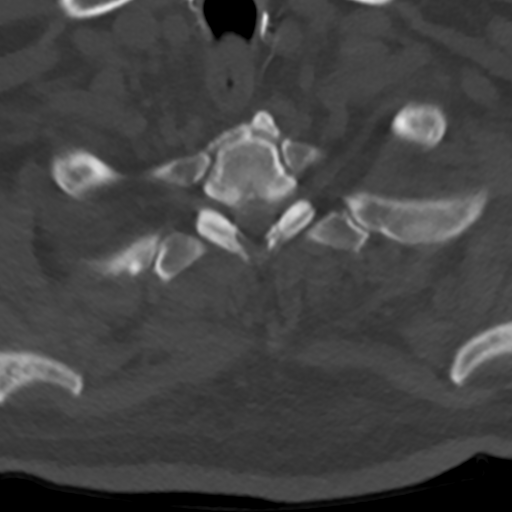

[Series 9: coronal st · coronal · 0.24mm/px · 3 of 65 slices shown]
[im 13/65  bone]
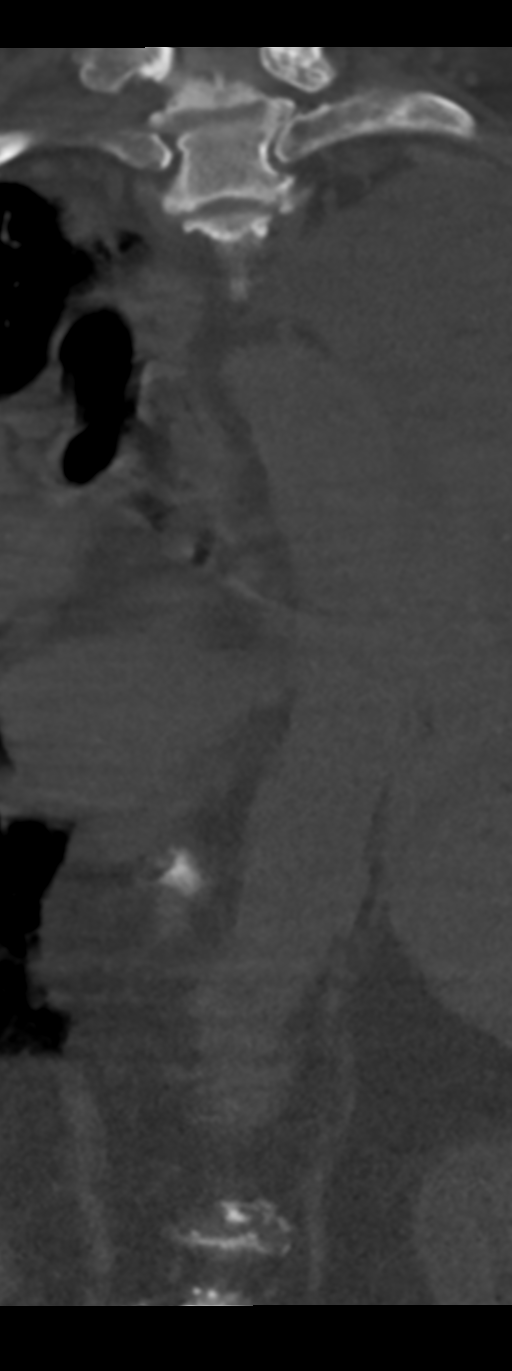
[im 26/65  bone]
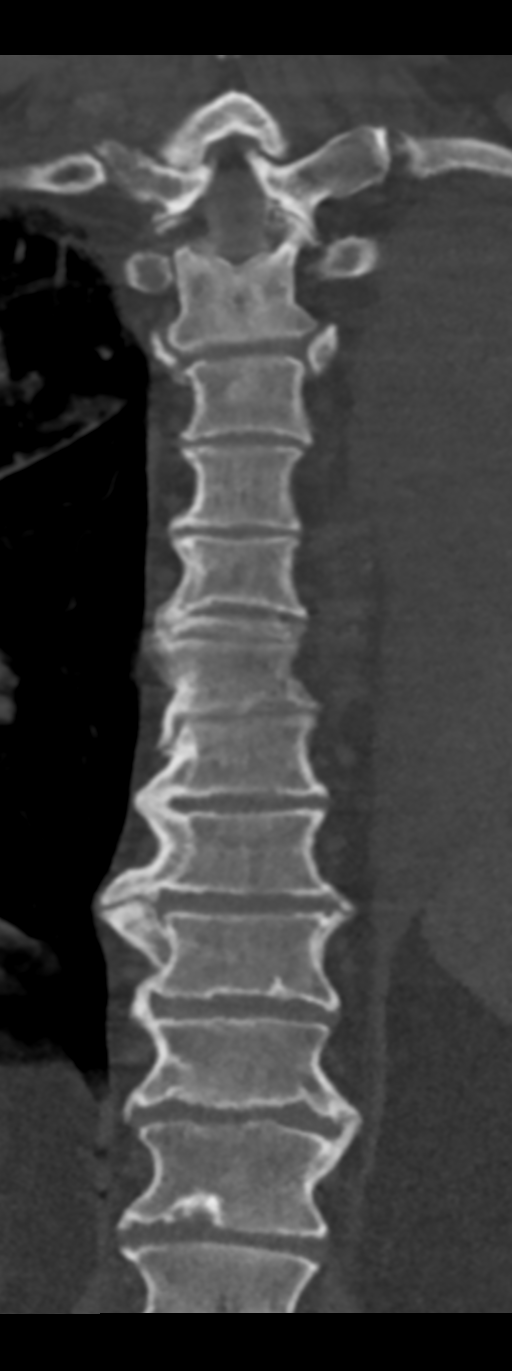
[im 39/65  bone]
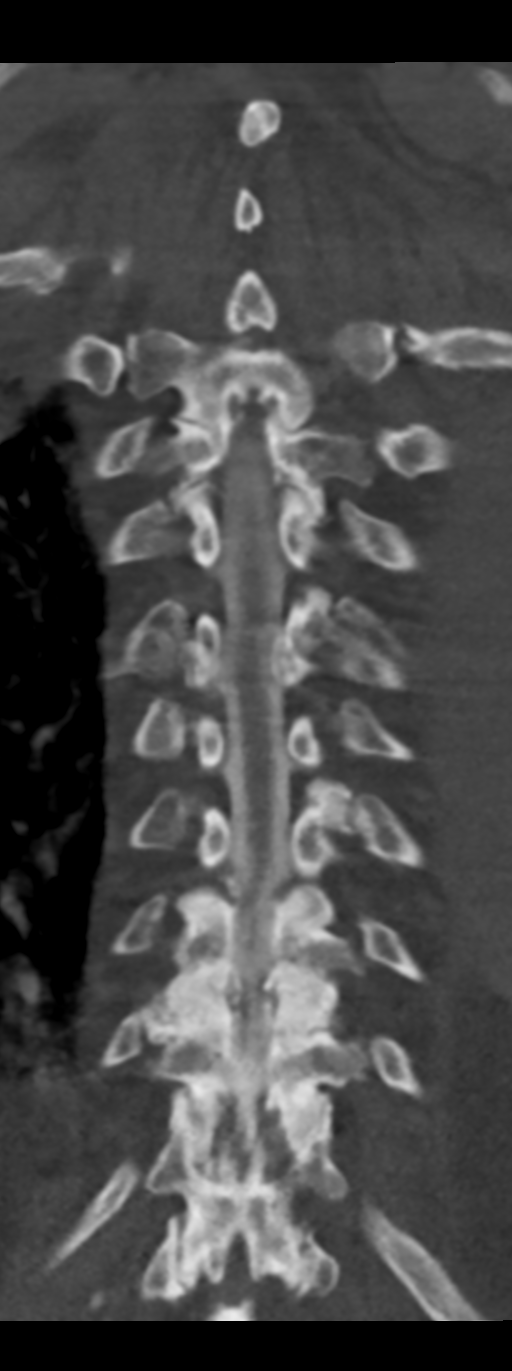

[9 of 33 positions shown; findings below may reference images not displayed]

FLUOROSCOPY TIME:  1.8 minutes.  95.7 mGy air kerma

PROCEDURE:
LUMBAR PUNCTURE FOR CERVICAL LUMBAR AND THORACIC MYELOGRAM

CERVICAL AND LUMBAR AND THORACIC MYELOGRAM

CT CERVICAL MYELOGRAM

CT LUMBAR MYELOGRAM

CT THORACIC MYELOGRAM

Written and oral informed consent was obtained. Consent was obtained
by Dr. MATAN. Time-out was performed.

Patient was positioned prone on the fluoroscopy table. Local
anesthesia was provided with 1% lidocaine without epinephrine after
prepped and draped in the usual sterile fashion. Puncture was
performed at L5-S1 using a 3 1/2 inch 22-gauge spinal needle via
midline approach. Using a single pass through the dura, the needle
was placed within the thecal sac, with return of clear CSF. 10 mL
Omnipaque 300 was injected into the thecal sac, with normal
opacification of the nerve roots and cauda equina consistent with
free flow within the subarachnoid space. Patient cannot stand or or
easily positioned on the table due to spasms and joint flexion in
the lower extremity. Contrast was advanced into the cervical region
in the lateral decubitus position.

I personally performed the lumbar puncture and administered the
intrathecal contrast. I also personally supervised acquisition of
the myelogram images.
FINDINGS: CERVICAL AND LUMBAR MYELOGRAM FINDINGS:

Myelography was only used for positioning of intrathecal contrast,
which flowed freely into the upper cervical spine.

L3-4 severe disc space degeneration with vacuum phenomenon and disc
space obliteration. There is severe stenosis at this level.

CT CERVICAL MYELOGRAM FINDINGS:

Alignment: Mild anterolisthesis C6-7

Vertebrae: C3-C6 ACDF with solid arthrodesis. Bridging osteophytes
ankylosis the C6-7 and C7-T1 spaces. There is also ankylosis across
the right facet and intervertebral space of C2-3.

Cord: Suboptimal intrathecal contrast which has layered since
myelography. Posterior longitudinal ligament ossification at C5
contacts the ventral cord but no effacement of dorsal CSF.

Extraspinal: No evidence of inflammation or mass.  Thyroidectomy.

Disc levels:

C1-2: Advanced right-sided facet osteoarthritis with joint space
narrowing and potential C2 impingement.

C2-3: Ankylosis.  No visible impingement

C3-4: ACDF. Patent canal. Residual ridging causes moderate left
foraminal narrowing.

C4-5: ACDF with solid arthrodesis. Patent spinal canal. The foramina
appear sufficiently patent

C5-6: ACDF. Uncovertebral and facet spurring causes moderate right
foraminal narrowing. Posterior longitudinal ligament ossification
contacts the ventral cord without compression.

C6-7: Facet spurring and anterolisthesis. Disc narrowing. Mild
bilateral foraminal narrowing. Patent spinal canal

C7-T1:Facet spurring and ankylosis. Bridging intervertebral
ankylosis.

CT THORACIC MYELOGRAM FINDINGS:

Alignment: Exaggerated thoracic kyphosis.

Vertebrae: No evidence of fracture or bone lesion.

Cord: Suboptimal intrathecal contrast in the upper thoracic levels.
No cord compression. Facet spurring in the lower thoracic levels
mildly encroaches on the posterolateral cord.

Extra-spinal: Left lower lobe and multi segment left upper lobe
collapse with large pleural effusion. There is also right-sided
atelectasis and volume loss. Cardiomegaly.

Disc levels: Diffuse degenerative disc narrowing and ventral
spurring. Bridging osteophytes are seen at multiple levels,
including T6-7, T8-9, and T10-11. Facet spurs encroach on lower
thoracic foramina without focal high-grade involvement

CT LUMBAR MYELOGRAM FINDINGS:

Segmentation: 5 lumbar type vertebrae.

Alignment: Mild dextroscoliosis. Trace retrolisthesis at L2-3 and
L3-4.

Vertebrae: Extensive vertebral body sclerosis attributed to
degenerative disease. No evidence of fracture or bone lesion.

Extra-spinal: Extensive atherosclerotic plaque on the aorta.

Conus: Tip terminates at L1-2.  No detected swelling

Disc levels:

T12- L1: Spondylosis.  No impingement

L1-L2: Disc narrowing and bulging with endplate ridging. Moderate
spinal stenosis.

L2-L3: Degenerative disc collapse with endplate sclerosis and
posterior ridging. High-grade spinal stenosis. Moderate left more
than right foraminal narrowing

L3-L4: Severe disc degeneration with disc space obliteration and
ridging. Bilateral facet spurring and ligamentum flavum thickening.
Advanced spinal stenosis. Right more than left foraminal impingement

L4-L5: Advanced disc narrowing with right-sided collapse and
endplate ridging. Facet osteoarthritis with bulky spurring on the
right where there is ankylosis. Left more than right subarticular
recess narrowing. Biforaminal impingement.

L5-S1:Disc narrowing and ridging. Right facet osteoarthritis.
Moderate right foraminal narrowing.
IMPRESSION: Lumbar spine:

1. Severe degenerative disease with mild scoliosis and listhesis.
2. L3-4 severe spinal stenosis.
3. L2-3 moderate to advanced spinal stenosis.
4. L1-2 moderate spinal stenosis.
5. Bilateral foraminal narrowings described above.

Cervical spine:

1. Resolved cord compression after ACDF. There is solid arthrodesis
from C3-C6 and degenerative ankylosis at C2-3, C6-7, and C7-T1.
2. Bilateral foraminal narrowings described above.
3. Large left pleural effusion with collapse of the majority of the
left lung.

Thoracic spine:

1. Diffuse degenerative disease without cord compression.
2. Multilevel spondylitic ankylosis.

## 2019-10-22 IMAGING — CT CT L SPINE W/O CM
3 of 4 series · 10 of 33 positions shown, 12 images · non-contrast
Comparison: none

CLINICAL DATA: Worsening spasticity. Spinal stenosis. Consideration
of baclofen pump.
TECHNIQUE: Contiguous axial images were obtained through the Cervical,
Thoracic, and Lumbar spine after the intrathecal infusion of
infusion. Coronal and sagittal reconstructions were obtained of the
axial image sets.

[Series 5: l spine 2.0 st · axial · 0.32mm/px · z∈[-596,-530]mm · 2 of 99 slices shown, 3 images]
[im 33/99  soft-tissue]
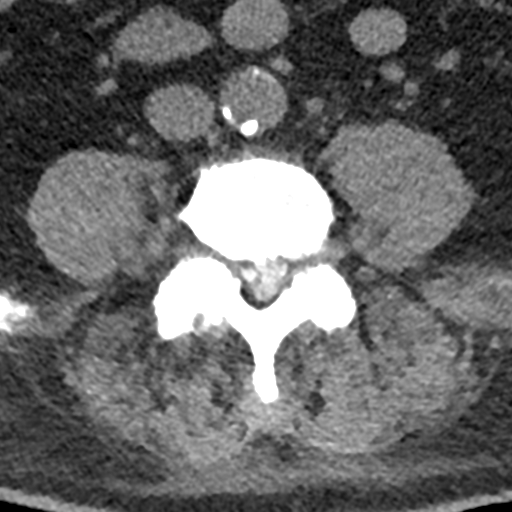
[im 33/99  bone]
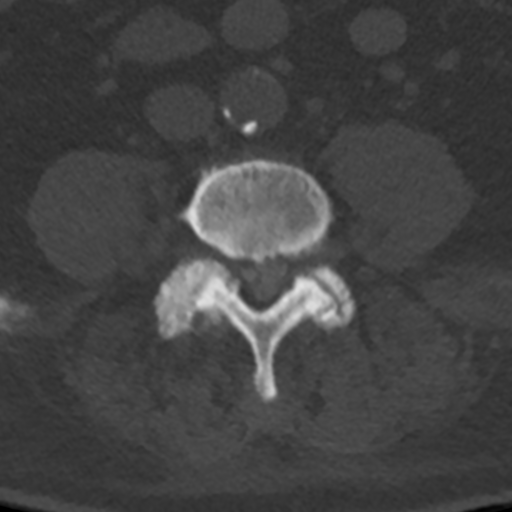
[im 66/99  bone]
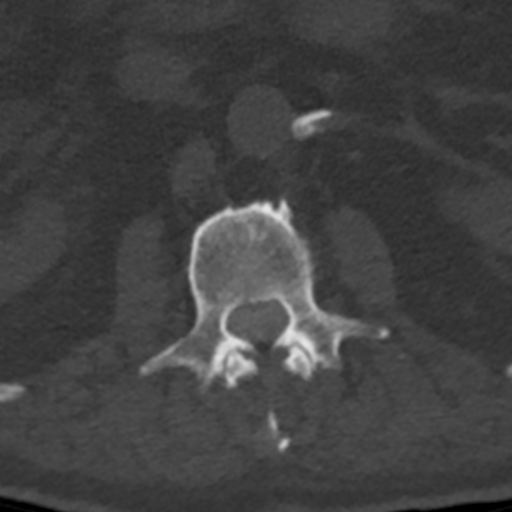

[Series 8: coronal st · coronal · 0.29mm/px · 3 of 58 slices shown]
[im 12/58  bone]
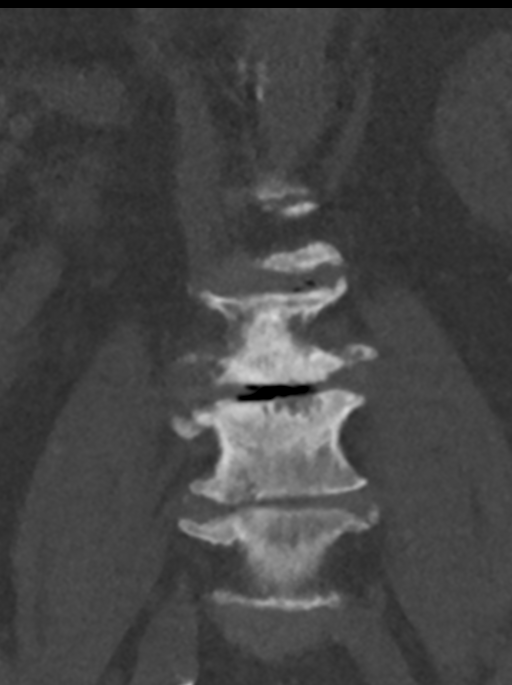
[im 23/58  bone]
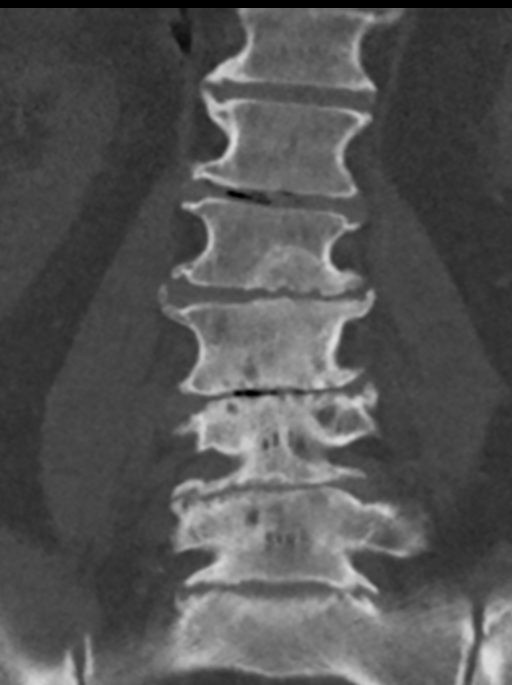
[im 35/58  bone]
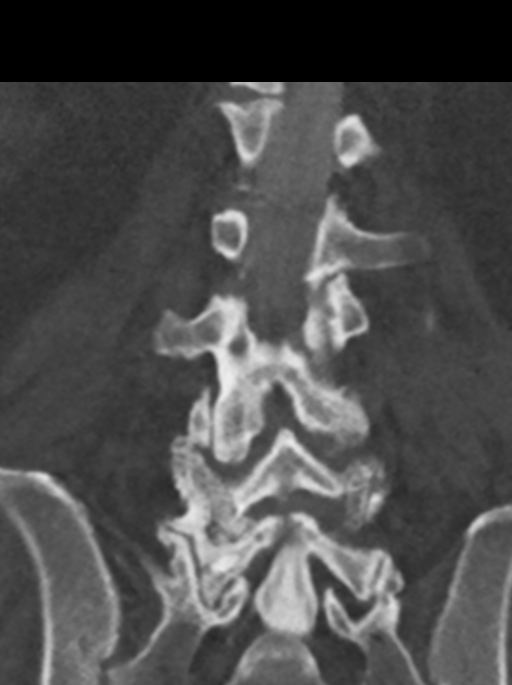

[Series 9: sagittal st · sagittal · 0.27mm/px · 5 of 60 slices shown, 6 images]
[im 20/60  bone]
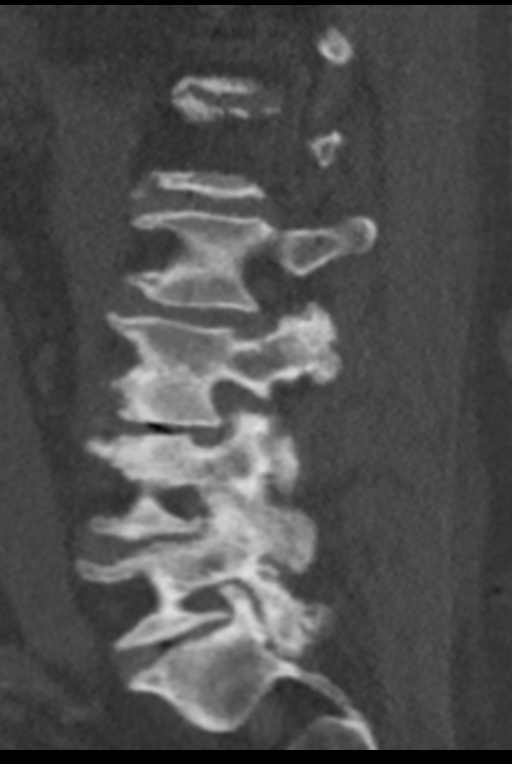
[im 25/60  bone]
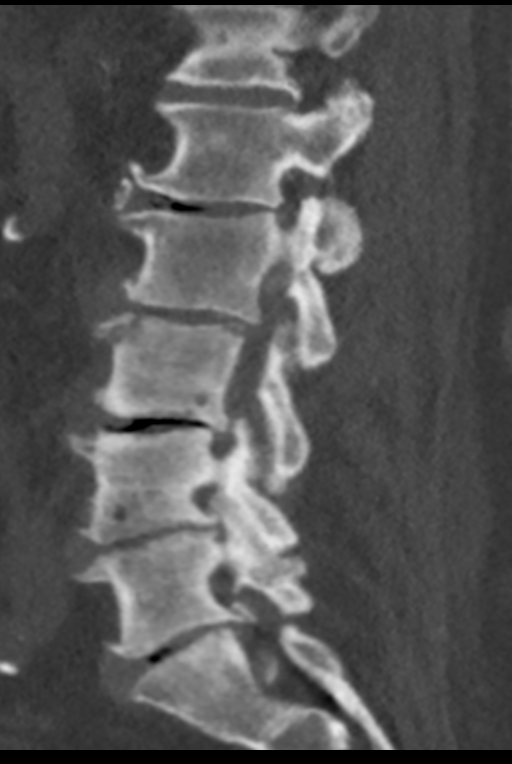
[im 30/60  soft-tissue]
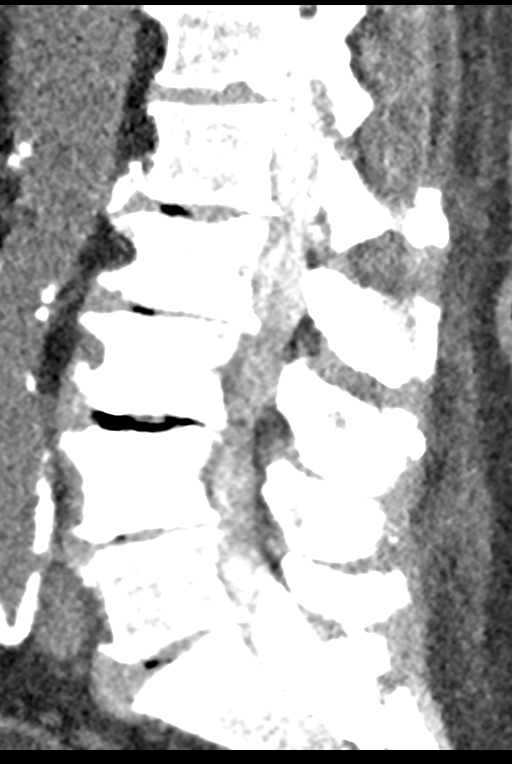
[im 30/60  bone]
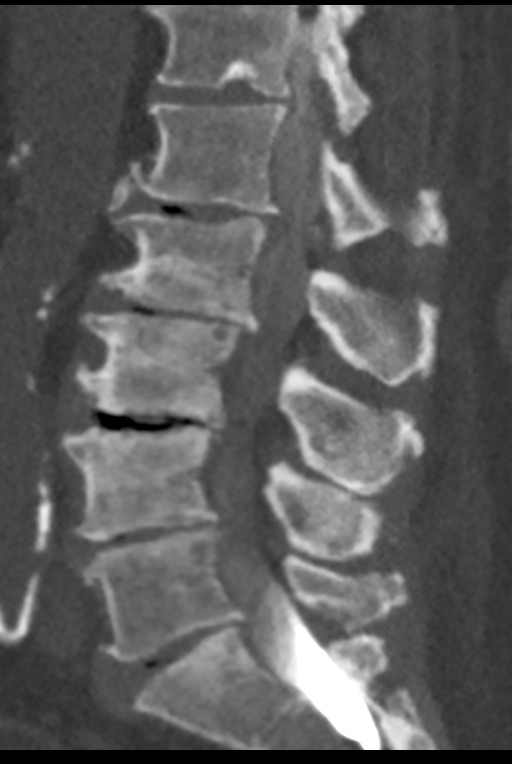
[im 35/60  bone]
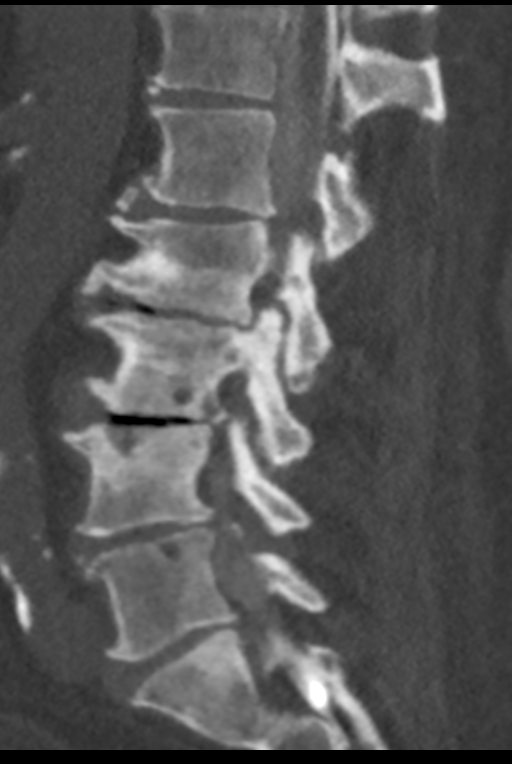
[im 40/60  bone]
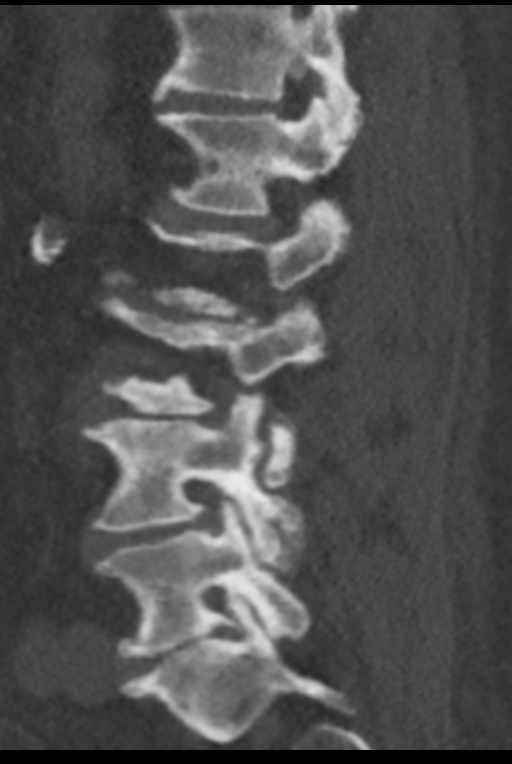

[10 of 33 positions shown; findings below may reference images not displayed]

FLUOROSCOPY TIME:  1.8 minutes.  95.7 mGy air kerma

PROCEDURE:
LUMBAR PUNCTURE FOR CERVICAL LUMBAR AND THORACIC MYELOGRAM

CERVICAL AND LUMBAR AND THORACIC MYELOGRAM

CT CERVICAL MYELOGRAM

CT LUMBAR MYELOGRAM

CT THORACIC MYELOGRAM

Written and oral informed consent was obtained. Consent was obtained
by Dr. MATAN. Time-out was performed.

Patient was positioned prone on the fluoroscopy table. Local
anesthesia was provided with 1% lidocaine without epinephrine after
prepped and draped in the usual sterile fashion. Puncture was
performed at L5-S1 using a 3 1/2 inch 22-gauge spinal needle via
midline approach. Using a single pass through the dura, the needle
was placed within the thecal sac, with return of clear CSF. 10 mL
Omnipaque 300 was injected into the thecal sac, with normal
opacification of the nerve roots and cauda equina consistent with
free flow within the subarachnoid space. Patient cannot stand or or
easily positioned on the table due to spasms and joint flexion in
the lower extremity. Contrast was advanced into the cervical region
in the lateral decubitus position.

I personally performed the lumbar puncture and administered the
intrathecal contrast. I also personally supervised acquisition of
the myelogram images.
FINDINGS: CERVICAL AND LUMBAR MYELOGRAM FINDINGS:

Myelography was only used for positioning of intrathecal contrast,
which flowed freely into the upper cervical spine.

L3-4 severe disc space degeneration with vacuum phenomenon and disc
space obliteration. There is severe stenosis at this level.

CT CERVICAL MYELOGRAM FINDINGS:

Alignment: Mild anterolisthesis C6-7

Vertebrae: C3-C6 ACDF with solid arthrodesis. Bridging osteophytes
ankylosis the C6-7 and C7-T1 spaces. There is also ankylosis across
the right facet and intervertebral space of C2-3.

Cord: Suboptimal intrathecal contrast which has layered since
myelography. Posterior longitudinal ligament ossification at C5
contacts the ventral cord but no effacement of dorsal CSF.

Extraspinal: No evidence of inflammation or mass.  Thyroidectomy.

Disc levels:

C1-2: Advanced right-sided facet osteoarthritis with joint space
narrowing and potential C2 impingement.

C2-3: Ankylosis.  No visible impingement

C3-4: ACDF. Patent canal. Residual ridging causes moderate left
foraminal narrowing.

C4-5: ACDF with solid arthrodesis. Patent spinal canal. The foramina
appear sufficiently patent

C5-6: ACDF. Uncovertebral and facet spurring causes moderate right
foraminal narrowing. Posterior longitudinal ligament ossification
contacts the ventral cord without compression.

C6-7: Facet spurring and anterolisthesis. Disc narrowing. Mild
bilateral foraminal narrowing. Patent spinal canal

C7-T1:Facet spurring and ankylosis. Bridging intervertebral
ankylosis.

CT THORACIC MYELOGRAM FINDINGS:

Alignment: Exaggerated thoracic kyphosis.

Vertebrae: No evidence of fracture or bone lesion.

Cord: Suboptimal intrathecal contrast in the upper thoracic levels.
No cord compression. Facet spurring in the lower thoracic levels
mildly encroaches on the posterolateral cord.

Extra-spinal: Left lower lobe and multi segment left upper lobe
collapse with large pleural effusion. There is also right-sided
atelectasis and volume loss. Cardiomegaly.

Disc levels: Diffuse degenerative disc narrowing and ventral
spurring. Bridging osteophytes are seen at multiple levels,
including T6-7, T8-9, and T10-11. Facet spurs encroach on lower
thoracic foramina without focal high-grade involvement

CT LUMBAR MYELOGRAM FINDINGS:

Segmentation: 5 lumbar type vertebrae.

Alignment: Mild dextroscoliosis. Trace retrolisthesis at L2-3 and
L3-4.

Vertebrae: Extensive vertebral body sclerosis attributed to
degenerative disease. No evidence of fracture or bone lesion.

Extra-spinal: Extensive atherosclerotic plaque on the aorta.

Conus: Tip terminates at L1-2.  No detected swelling

Disc levels:

T12- L1: Spondylosis.  No impingement

L1-L2: Disc narrowing and bulging with endplate ridging. Moderate
spinal stenosis.

L2-L3: Degenerative disc collapse with endplate sclerosis and
posterior ridging. High-grade spinal stenosis. Moderate left more
than right foraminal narrowing

L3-L4: Severe disc degeneration with disc space obliteration and
ridging. Bilateral facet spurring and ligamentum flavum thickening.
Advanced spinal stenosis. Right more than left foraminal impingement

L4-L5: Advanced disc narrowing with right-sided collapse and
endplate ridging. Facet osteoarthritis with bulky spurring on the
right where there is ankylosis. Left more than right subarticular
recess narrowing. Biforaminal impingement.

L5-S1:Disc narrowing and ridging. Right facet osteoarthritis.
Moderate right foraminal narrowing.
IMPRESSION: Lumbar spine:

1. Severe degenerative disease with mild scoliosis and listhesis.
2. L3-4 severe spinal stenosis.
3. L2-3 moderate to advanced spinal stenosis.
4. L1-2 moderate spinal stenosis.
5. Bilateral foraminal narrowings described above.

Cervical spine:

1. Resolved cord compression after ACDF. There is solid arthrodesis
from C3-C6 and degenerative ankylosis at C2-3, C6-7, and C7-T1.
2. Bilateral foraminal narrowings described above.
3. Large left pleural effusion with collapse of the majority of the
left lung.

Thoracic spine:

1. Diffuse degenerative disease without cord compression.
2. Multilevel spondylitic ankylosis.

## 2019-10-22 MED ORDER — IOHEXOL 300 MG/ML  SOLN
10.0000 mL | Freq: Once | INTRAMUSCULAR | Status: AC | PRN
  Administered 2019-10-22: 08:00:00 10 mL via INTRATHECAL

## 2019-10-22 MED ORDER — LIDOCAINE HCL (PF) 1 % IJ SOLN
5.0000 mL | Freq: Once | INTRAMUSCULAR | Status: AC
  Administered 2019-10-22: 3 mL via INTRADERMAL

## 2019-10-22 NOTE — Progress Notes (Signed)
Recreational Therapy Session Note  Patient Details  Name: [name] MRN: [id number] Date of Birth: Mar 12, 1941 Today's Date: 10/22/2019 Time:  [id number] Pain: no c/o Skilled Therapeutic Interventions/Progress Updates: Pt on bedpan upon arrival, stating he thought he was finished.  Pt required total assist +2 for bed mobiltiy and hygiene throughout session.  Bed functions not working properly, making bed mobility more difficult.  Utilized maxi move with +3 assist to safely position pt in the sling  bed level and transfer into w/c.  Pt left in w/c with COTA for continued therapy while awaiting a new bed to be delivered.  Therapy/Group: Co-Treatment  Oliviana Mcgahee 10/22/2019, 3:52 PM

## 2019-10-22 NOTE — Plan of Care (Signed)
  Problem: SCI BOWEL ELIMINATION Goal: RH STG MANAGE BOWEL WITH ASSISTANCE Description: STG Manage Bowel with min Assistance. Outcome: Progressing Goal: RH STG SCI MANAGE BOWEL PROGRAM W/ASSIST OR AS APPROPRIATE Description: STG SCI Manage bowel program w/ min assist or as appropriate. Outcome: Progressing   Problem: SCI BLADDER ELIMINATION Goal: RH STG MANAGE BLADDER WITH ASSISTANCE Description: STG Manage Bladder With min/mod Assistance Outcome: Progressing   Problem: RH PAIN MANAGEMENT Goal: RH STG PAIN MANAGED AT OR BELOW PT'S PAIN GOAL Description: < 4 Outcome: Progressing

## 2019-10-22 NOTE — Progress Notes (Signed)
Physical Therapy Session Note  Patient Details  Name: [name] MRN: [id number] Date of Birth: 1940/07/03  Today's Date: 10/22/2019 PT Missed Time: 60 minutes Missed Time Reason: MD hold (pt on bedrest x 4 hours per radiology following CT myelogram)     Short Term Goals: Week 4:  PT Short Term Goal 1 (Week 4): Pt will complete bed mobility with assist x 1 PT Short Term Goal 2 (Week 4): Pt will complete least restrictive transfer with assist x 1 PT Short Term Goal 3 (Week 4): Pt will perform w/c mobility x 100 ft at Supervision level  Skilled Therapeutic Interventions/Progress Updates:    Attempted to see patient for scheduled therapy session. Per pt and confirmed by RN via discussion with radiology pt on bedrest x 4 hours following CT myelogram and needs to be completely supine in bed. Pt missed 60 min of scheduled therapy session due to bedrest orders. Will follow up per POC.  Therapy Documentation Precautions:  Precautions Precautions: Fall Restrictions Weight Bearing Restrictions: No    Therapy/Group: Individual Therapy   Excell Seltzer, PT, DPT  10/22/2019, 7:29 AM

## 2019-10-22 NOTE — Progress Notes (Signed)
Occupational Therapy Session Note  Patient Details  Name: [name] MRN: [id number] Date of Birth: 19-Jul-1940  Today's Date: 10/22/2019 OT Individual Time: 0215-0340 OT Individual Time Calculation (min): 85 min    Short Term Goals: Week 3:  OT Short Term Goal 1 (Week 3): STG=LTG secondary to ELOS  Skilled Therapeutic Interventions/Progress Updates:  Pt received supine in bed agreeable to co-txt with OT and TR. Noted pts bed to be trending downward and reading an error message- RN reports already called to get a new bed however, pt report needing to void bowels; total A +2 to roll to position bed pan and total A +2 to complete hygiene and LB dressing post BM. Utilized Maximove to lift pt from supine>w/c. Transported pt outside with total A for improved mood and to facilitate functional environment. Pt completed household distance w/c propulsion across uneven brick to facilitate community reintegration mobility with supervision to navigate around obstables. Pt transported back to room in w/c with total A where pt left up in w/c with alarm belt activated and all needs within reach. New bed was delivered at end of session.   Therapy Documentation Precautions:  Precautions Precautions: Fall Restrictions Weight Bearing Restrictions: No General:   Vital Signs: Therapy Vitals Temp: 98.7 F (37.1 C) Pulse Rate: 80 Resp: 16 BP: (!) 91/54 Patient Position (if appropriate): Sitting Oxygen Therapy SpO2: (!) 87 % O2 Device: Room Air Pain: Pt reports unrated pain in L ankle. Utilized repositioning and placed pillow on ankle during bed mobility as pain mgmt strategy.   Therapy/Group: Individual Therapy  [name] 10/22/2019, 4:00 PM

## 2019-10-22 NOTE — Progress Notes (Signed)
[place] PHYSICAL MEDICINE & REHABILITATION PROGRESS NOTE    Subjective:  Pt reports doing better with SOB on O2 but "not perfect".   Also got Baclofen and pain meds scheduled this AM by radiology, however, didn't think he got them, because no change in spasms.   Tried ot refuse CT myelogram this AM- however explained need it, and decided to go.    ROS:  Pt denies SOB, abd pain, CP, N/V/C/D, and vision changes   Objective:   No results found. Recent Labs    10/20/19 0531  WBC 8.4  HGB 11.8*  HCT 36.9*  PLT 457*   Recent Labs    10/20/19 0531  NA 143  K 3.8  CL 101  CO2 35*  GLUCOSE 98  BUN 11  CREATININE 0.69  CALCIUM 8.8*    Intake/Output Summary (Last 24 hours) at 10/22/2019 0829 Last data filed at 10/22/2019 0506 Gross per 24 hour  Intake 140 ml  Output 2500 ml  Net -2360 ml     Physical Exam: Vital Signs Blood pressure (!) 146/77, pulse 82, temperature 98.9 F (37.2 C), temperature source Oral, resp. rate 18, height 5\' 7"  (1.702 m), weight 92.5 kg, SpO2 91 %. Constitutional: on stretcher heading downstairs, appropriate, on O2, NAD HENT: conjugate gaze. Cardiovascular: RRR Respiratory: prenebs- still coarse and wheezing, prenebs, adequate air movement B/L GI: Soft, NT, ND, (+)BS   Skin: MASD with sanguinous drainage- on CPAP cannot assess Psych:frustrated about spasms.  Musc: No edema in extremities.  No tenderness in extremities. Neurologic: Alert see spasms of LEs esp L side every 20-30 seconds.  Motor:  Left lower extremity: 2/5 proximal distal, unchanged Right lower extremity: 1/5 proximal distal- LE exam unchanged.   Assessment/Plan: 1. Functional deficits secondary to lumbar spinal stenosis with neurogenic claudication which require 3+ hours per day of interdisciplinary therapy in a comprehensive inpatient rehab setting.  Physiatrist is providing close team supervision and 24 hour management of active medical problems listed  below.  Physiatrist and rehab team continue to assess barriers to discharge/monitor patient progress toward functional and medical goals  Care Tool:  Bathing    Body parts bathed by patient: Chest, Abdomen, Face   Body parts bathed by helper: Buttocks     Bathing assist Assist Level: Maximal Assistance - Patient 24 - 49%     Upper Body Dressing/Undressing Upper body dressing   What is the patient wearing?: Pull over shirt    Upper body assist Assist Level: Moderate Assistance - Patient 50 - 74%    Lower Body Dressing/Undressing Lower body dressing      What is the patient wearing?: Pants, Underwear/pull up     Lower body assist Assist for lower body dressing: 2 Helpers     Toileting Toileting    Toileting assist Assist for toileting: Dependent - Patient 0%     Transfers Chair/bed transfer  Transfers assist     Chair/bed transfer assist level: Dependent - mechanical lift     Locomotion Ambulation   Ambulation assist   Ambulation activity did not occur: Safety/medical concerns  Assist level: 2 helpers Assistive device: Parallel bars Max distance: 5'   Walk 10 feet activity   Assist  Walk 10 feet activity did not occur: Safety/medical concerns        Walk 50 feet activity   Assist Walk 50 feet with 2 turns activity did not occur: Safety/medical concerns         Walk 150 feet activity  Assist Walk 150 feet activity did not occur: Safety/medical concerns         Walk 10 feet on uneven surface  activity   Assist Walk 10 feet on uneven surfaces activity did not occur: Safety/medical concerns         Wheelchair     Assist Will patient use wheelchair at discharge?: Yes Type of Wheelchair: Manual    Wheelchair assist level: Supervision/Verbal cueing Max wheelchair distance: 64'    Wheelchair 50 feet with 2 turns activity    Assist        Assist Level: Supervision/Verbal cueing   Wheelchair 150 feet  activity     Assist      Assist Level: Supervision/Verbal cueing   Blood pressure (!) 146/77, pulse 82, temperature 98.9 F (37.2 C), temperature source Oral, resp. rate 18, height 5\' 7"  (1.702 m), weight 92.5 kg, SpO2 91 %.    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  Per neurosurgery, follow-up with outpatient after NCS/EMG.  Neurology consulted recommended management of spasticity and spasms with outpatient NCS/EMG  5/3- Asked Neuro to come back for recs- but  they recommended was what we discussed, a ITB pump, which cannot be placed inpt. Didn't address NSU note at all.   5/4- will get MRI of cervical spine- since cannot find anything else to focus on-  5/5- spoke with NSU and Neurology- decided on doing CT myelogram of entire spine-ordered and to go this AM   Continue CIR 2. Antithrombotics: -DVT/anticoagulation:Lovenox.   4/12- Dopplers (-)- con't lovenox -antiplatelet therapy: Aspirin 81 mg daily 3. Pain Management:Neurontin 100 mg nightly, OxyContin 10 mg every 12 hours, hydrocodone as needed as well as Imitrex for headaches  baclofen to 15 mg TID- with meals and 20 mg QHS, increase nighttime dose to 30mg  on 5/1  with improvement in resting spasms  Tizanidine 2 mg 3 times daily  5/3- changed breakfast time Baclofen to 0600 since spasms so bad in AM-   5/4- increase baclofen to 20 mg TID and 30 mg QHS  5/5- spasms no better-  4. Mood:Provide emotional support -antipsychotic agents: N/A 5. Neuropsych: This patientiscapable of making decisions on hisown behalf. 6. Skin/Wound Care:place steristrips over right index finger lac.                          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  Foam dressing left knee  Barrier cream MASD on sacrum  5/3- using Prevalon boots 7. Fluids/Electrolytes/Nutrition:Routine in and outs  Calcium 8.5 on 4/26-started  calcium supplement, labs ordered for tomorrow 8. Chronic restless leg syndrome. Sinemet 10-100 mg twice daily.  See #3 9. 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 OSA with CPAP/2 L chronic oxygen at home. Continue inhalers 11. 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 Follow-up outpatient 12. Diabetes mellitus. Hemoglobin A1c 5.8. SSI. Changed to [facility] HS for CBG and SSI  HbA1c 5.8- 13. Neurogenic bowel and bladder. Check PVR  Incontinent BMs 14. Hypothyroidism.  Continue Synthroid 15. Chronic diastolic congestive heart failure. Monitor for any signs of fluid overload. Check regular weights   Filed Weights   10/19/19 0513 10/21/19 0500 10/22/19 0506  Weight: 95.8 kg 98.7 kg 92.5 kg   5/4- up to 98.7 kg- up 3 kg in a 2 days- will monitor 16. Hyperlipidemia. Zocor 17. Code status: Changed to DNR as per patient's wishes. 18. Leukocytosis - pneumonia- Resolved 19. Wheezing  Resolved on abx, scheduled  umeclidinium  4/28- pulmonary saw pt- maximized meds  5/4- improved this AM- feeling SOB when has spasms- thinks it affects diaphragm- can see that 20 Spasticity vs tone in hamstrings, chronically WC bound may be contracted  See #3 21. Neurogenic bladder  4/28-started Flomax 0.4 mg daily and place foley per wishes of pt.   5/3- will increase flomax to try and see if will void once foley try to remove.  22.  Labile blood pressure  Monitor for trend   LOS: 26 days A FACE TO FACE EVALUATION WAS PERFORMED  Raedyn Klinck 10/22/2019, 8:29 AM

## 2019-10-22 NOTE — Progress Notes (Signed)
Occupational Therapy Note  Patient Details  Name: FACUNDO LANTIS MRN: [id number] Date of Birth: [birth year]  Today's Date: 10/22/2019 OT Missed Time: 31 Minutes Missed Time Reason: Patient on bedrest(bedrest following CT myleograph)  Pt missed 75 mins skilled OT services.  Pt on bed rest following earlier CT.    Leotis Shames [facility] 10/22/2019, 10:56 AM

## 2019-10-23 ENCOUNTER — Inpatient Hospital Stay (HOSPITAL_COMMUNITY): Payer: Medicare Other | Admitting: *Deleted

## 2019-10-23 ENCOUNTER — Inpatient Hospital Stay (HOSPITAL_COMMUNITY): Payer: Medicare Other | Admitting: Physical Therapy

## 2019-10-23 ENCOUNTER — Inpatient Hospital Stay (HOSPITAL_COMMUNITY): Payer: Medicare Other

## 2019-10-23 IMAGING — CT CT ANKLE*L* W/O CM
1 series · 12 of 14 positions shown, 15 images · non-contrast
Comparison: None.

CLINICAL DATA: Soft tissue wound of the lateral aspect of the left
ankle at the lateral malleolus.

EXAM:
CT OF THE LEFT ANKLE WITHOUT CONTRAST
TECHNIQUE: Multidetector CT imaging of the left ankle was performed according
to the standard protocol. Multiplanar CT image reconstructions were
also generated.

[Series 4: lower ext 1.5 st · axial · 0.38mm/px · z∈[+130,+301]mm · 12 of 136 slices shown, 15 images]
[im 11/136  soft-tissue]
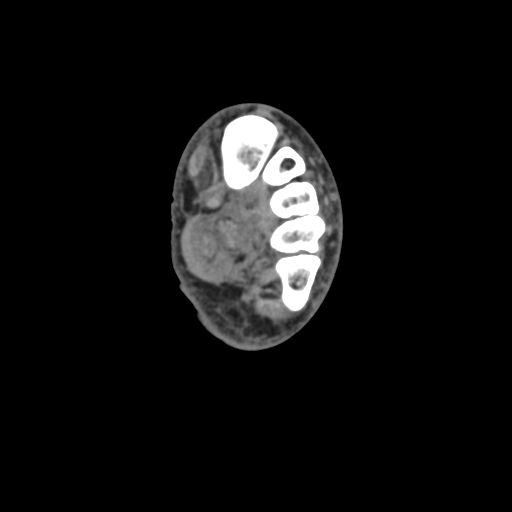
[im 11/136  bone]
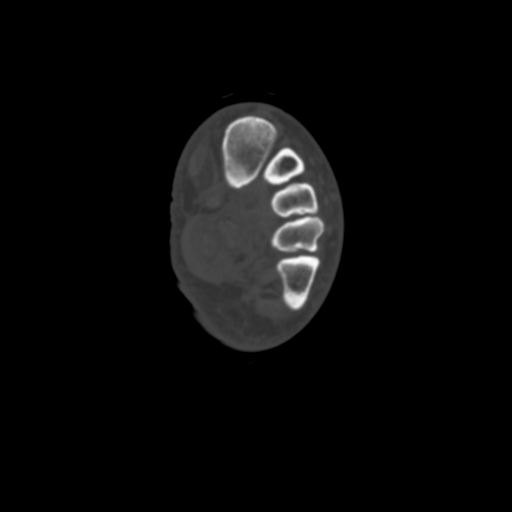
[im 21/136  bone]
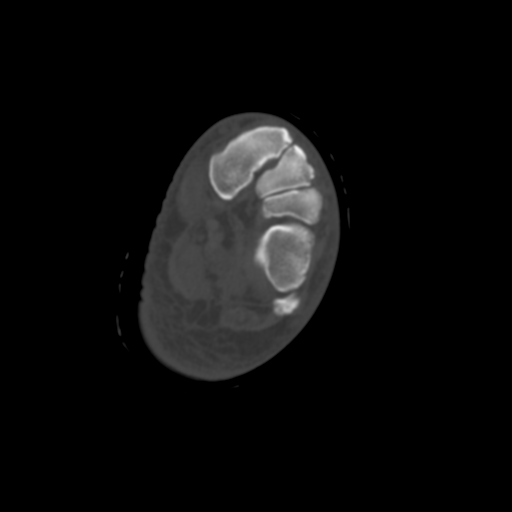
[im 32/136  bone]
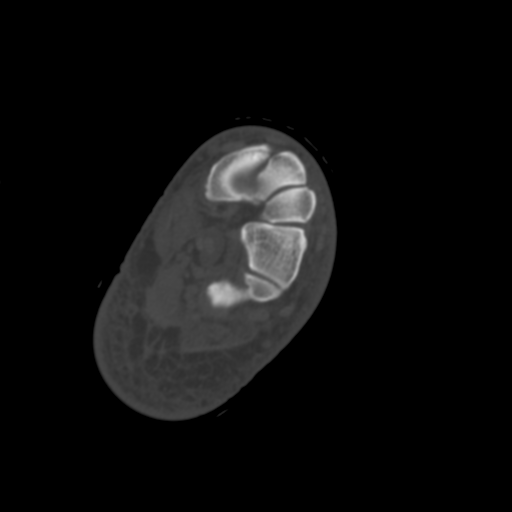
[im 42/136  bone]
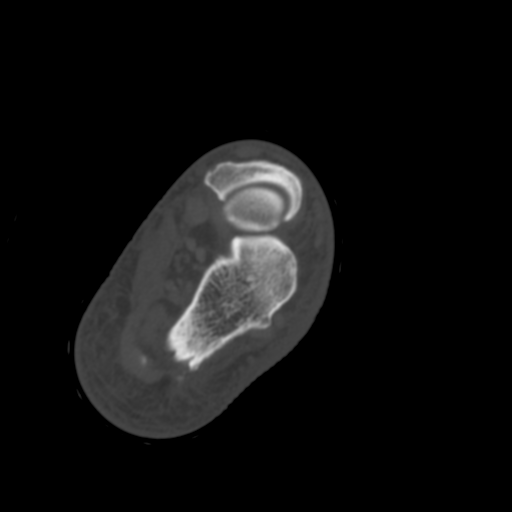
[im 52/136  soft-tissue]
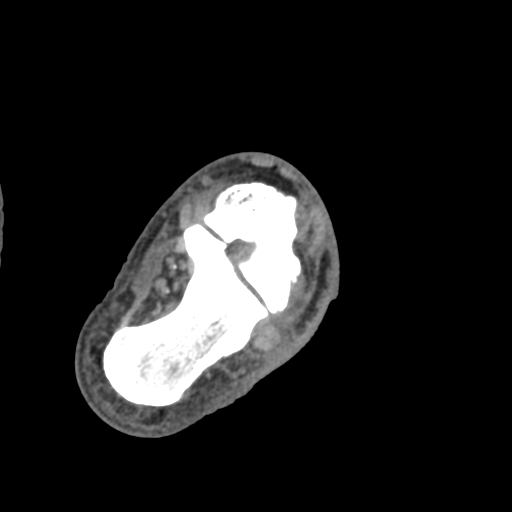
[im 52/136  bone]
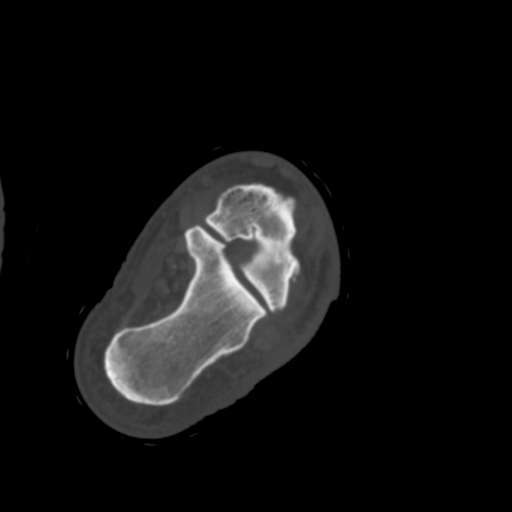
[im 63/136  bone]
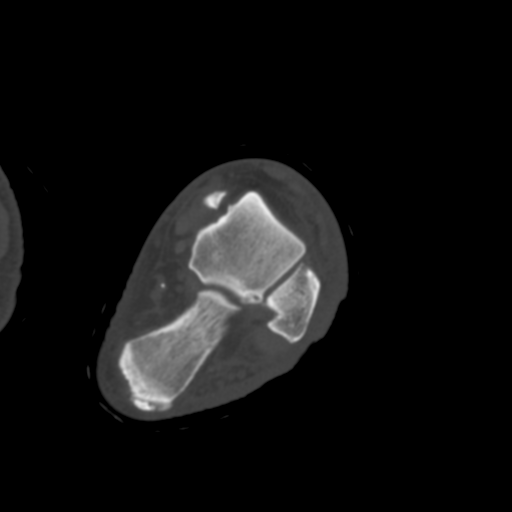
[im 73/136  bone]
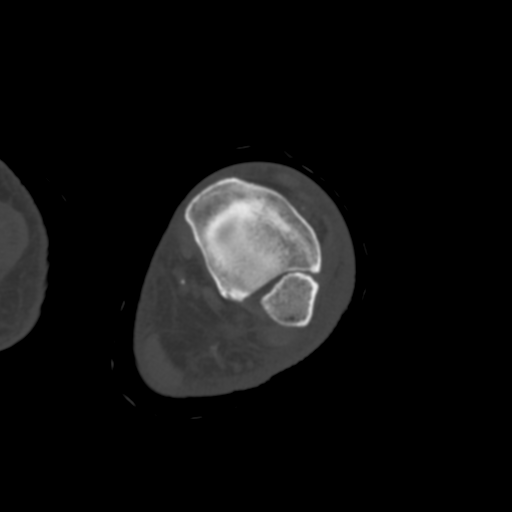
[im 84/136  bone]
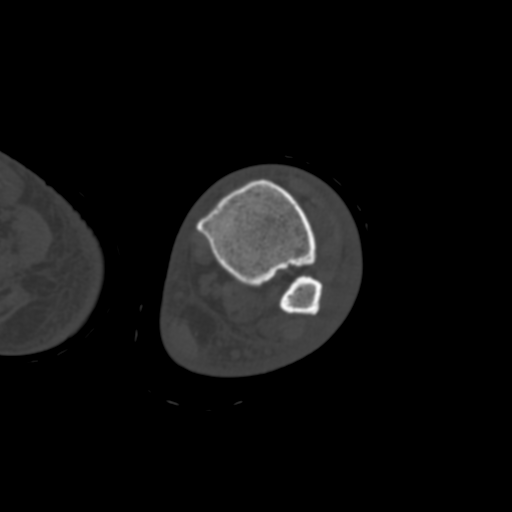
[im 94/136  soft-tissue]
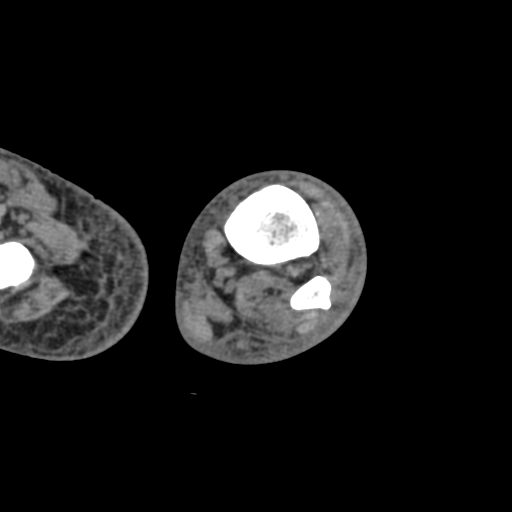
[im 94/136  bone]
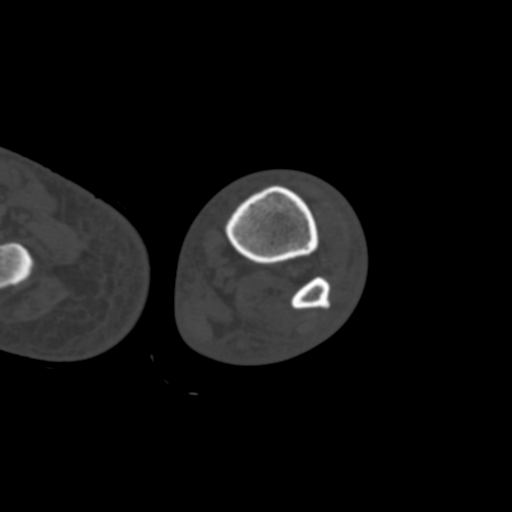
[im 104/136  bone]
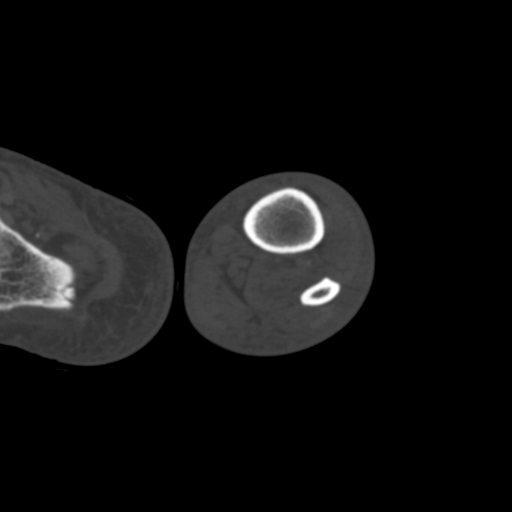
[im 115/136  bone]
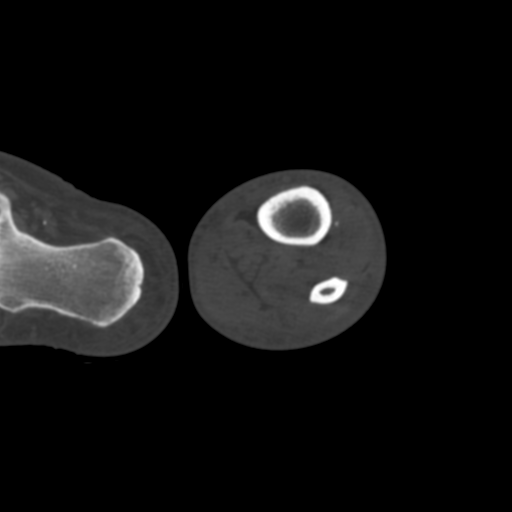
[im 125/136  bone]
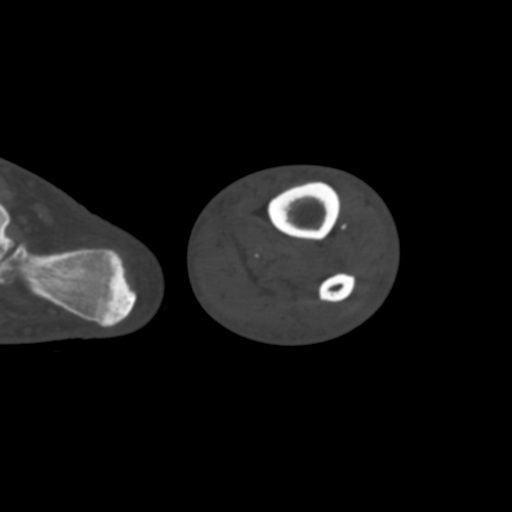

[12 of 14 positions shown; findings below may reference images not displayed]

FINDINGS: Bones/Joint/Cartilage

There is no evidence of osteomyelitis, fracture, or other acute
abnormality of the bones of the ankle. Tiny marginal osteophytes on
the distal tibia. Minimal cystic degenerative changes talus at the
posterior facet of the subtalar joint. Small intraosseous ganglion
cyst in the calcaneus.

No ankle or subtalar joint effusions.

Ligaments

Suboptimally assessed by CT. Not well enough seen for assessment.

Muscles and Tendons

The tendons around the ankle demonstrate no significant
abnormalities. No discrete muscle abnormality.

Soft tissues

Subcutaneous edema around the ankle, most prominent over the lateral
malleolus where there is a superficial soft tissue ulceration. No
visible abscess.
IMPRESSION: 1. No evidence of osteomyelitis, fracture, or other acute
abnormality of the bones of the ankle.
2. Subcutaneous edema around the ankle, most prominent over the
lateral malleolus where there is a superficial soft tissue
ulceration.
3. No visible abscess.

## 2019-10-23 MED ORDER — COLLAGENASE 250 UNIT/GM EX OINT
TOPICAL_OINTMENT | Freq: Every day | CUTANEOUS | Status: DC
  Filled 2019-10-23: qty 30

## 2019-10-23 MED ORDER — BACLOFEN 20 MG PO TABS
40.0000 mg | ORAL_TABLET | Freq: Every day | ORAL | Status: DC
  Administered 2019-10-23 – 2019-10-27 (×5): 40 mg via ORAL
  Filled 2019-10-23 (×6): qty 2
  Filled 2019-10-23: qty 4
  Filled 2019-10-23 (×2): qty 2

## 2019-10-23 MED ORDER — BACLOFEN 20 MG PO TABS
30.0000 mg | ORAL_TABLET | Freq: Three times a day (TID) | ORAL | Status: DC
  Administered 2019-10-23 – 2019-10-28 (×15): 30 mg via ORAL
  Filled 2019-10-23 (×11): qty 1

## 2019-10-23 NOTE — Progress Notes (Signed)
Discussed myelogram / MRI findings with the patient and his family. I explained that it's not clear where his weakness is coming from. But, given the difficulty of localization in the setting of chronic worsening spasticity, it potentially could be from his severe lumbar stenosis. It's worst at L3-4, we discussed an MIS L3-4 decompression. Aside from usual risks / benefits / alternatives, we discussed at length that this is due to progressive loss of function and there is a real possibility that this does not halt or reverse that process. He would like to proceed, will schedule for 10/27/19.

## 2019-10-23 NOTE — Plan of Care (Signed)
  Problem: Consults Goal: RH SPINAL CORD INJURY PATIENT EDUCATION Description:  See Patient Education module for education specifics.  Outcome: Progressing Goal: Skin Care Protocol Initiated - if Braden Score 18 or less Description: If consults are not indicated, leave blank or document N/A Outcome: Progressing   Problem: SCI BOWEL ELIMINATION Goal: RH STG MANAGE BOWEL WITH ASSISTANCE Description: STG Manage Bowel with min Assistance. Outcome: Progressing Goal: RH STG SCI MANAGE BOWEL PROGRAM W/ASSIST OR AS APPROPRIATE Description: STG SCI Manage bowel program w/ min assist or as appropriate. Outcome: Progressing   Problem: SCI BLADDER ELIMINATION Goal: RH STG MANAGE BLADDER WITH ASSISTANCE Description: STG Manage Bladder With min/mod Assistance Outcome: Progressing   Problem: RH SKIN INTEGRITY Goal: RH STG SKIN FREE OF INFECTION/BREAKDOWN Description: Patients skin will remain free from further infection or breakdown with mod assist. Outcome: Progressing Goal: RH STG MAINTAIN SKIN INTEGRITY WITH ASSISTANCE Description: STG Maintain Skin Integrity With mod Assistance. Outcome: Progressing Goal: RH STG ABLE TO PERFORM INCISION/WOUND CARE W/ASSISTANCE Description: STG Able To Perform Incision/Wound Care With total Assistance from caregiver. Outcome: Progressing   Problem: RH SAFETY Goal: RH STG ADHERE TO SAFETY PRECAUTIONS W/ASSISTANCE/DEVICE Description: STG Adhere to Safety Precautions With mod I Assistance/Device. Outcome: Progressing   Problem: RH PAIN MANAGEMENT Goal: RH STG PAIN MANAGED AT OR BELOW PT'S PAIN GOAL Description: < 4 Outcome: Progressing

## 2019-10-23 NOTE — Progress Notes (Signed)
Physical Therapy Session Note  Patient Details  Name: [name] MRN: [id number] Date of Birth: 07-21-1940  Today's Date: 10/23/2019 PT Individual Time: 1415-1445 PT Individual Time Calculation (min): 30 min   Short Term Goals: Week 4:  PT Short Term Goal 1 (Week 4): Pt will complete bed mobility with assist x 1 PT Short Term Goal 2 (Week 4): Pt will complete least restrictive transfer with assist x 1 PT Short Term Goal 3 (Week 4): Pt will perform w/c mobility x 100 ft at Supervision level  Skilled Therapeutic Interventions/Progress Updates:   Pt received in R sidelying, pt's wife and son present as well. Pt denied pain but reports he does not want to participate in activity as he had just found a position that eliminated the pain in his low back. Per primary OT/PT, pt likely a good candidate for power mobility and pt and wife w/ questions regarding power chair functions. Therapist brought loaner power chair and demonstrated all functions including tilting, reclining, and seat lift. Discussed feasibility of a power chair in the household environment and ability for zero turn. Also discussed using power mobility in community and that he would need handicap transport or for his family to purchase a power w/c accessible van. Discussed benefits of increased independence w/ weight shifting, pressure relief, and locomotion. Pt and wife's questions answered to their satisfaction. Both appear very open to the option of power mobility. Pt remained in R sidelying, he continued to decline participation in activity. Pt missed 45 min of skilled PT 2/2 pain.   Therapy Documentation Precautions:  Precautions Precautions: Fall Restrictions Weight Bearing Restrictions: No General: PT Amount of Missed Time (min): 45 Minutes PT Missed Treatment Reason: Pain  Therapy/Group: Individual Therapy  Leiana Rund Clent Demark 10/23/2019, 5:55 PM

## 2019-10-23 NOTE — Progress Notes (Signed)
Physical Therapy Session Note  Patient Details  Name: [name] MRN: [id number] Date of Birth: 07-May-1941  Today's Date: 10/23/2019 PT Individual Time: 0930-1015 PT Individual Time Calculation (min): 45 min  PT Missed Time: 15 min Missed Time Reason: Nursing care  Short Term Goals: Week 4:  PT Short Term Goal 1 (Week 4): Pt will complete bed mobility with assist x 1 PT Short Term Goal 2 (Week 4): Pt will complete least restrictive transfer with assist x 1 PT Short Term Goal 3 (Week 4): Pt will perform w/c mobility x 100 ft at Supervision level  Skilled Therapeutic Interventions/Progress Updates:    Pt received seated in bed, RN in room to assess wound on buttocks and perform dressing change. Pt missed 15 min of scheduled therapy session for wound care. Once wound care completed pt agreeable to PT session, reports ongoing pain in L ankle at site of wound. Pt reports spasms are slightly improved this date, agreeable to get up to w/c. Discussion with patient about wound on buttocks and coccyx and his inability to perform pressure relief while seated in a standard wheelchair, provided TIS w/c and discussed benefits with regards to positioning in this type of chair but downside of pt being dependent for pressure relief and mobility. Rolling L/R with assist x 2 to don pants, dependent to don TEDs at bed level. Rolling L/R with assist x 2 for placement of maxi move sling. Maxi move transfer bed to w/c. Once seated in w/c adjusted tilt angle and leg rests for improved patient positioning and comfort. Pt initially on 2L O2 at rest, SpO2 90% but drops into 80's with activity. Increased SpO2 to 3L. Pt left semi-reclined in TIS w/c in room with needs in reach, quick release belt and chair alarm in place at end of session.  Therapy Documentation Precautions:  Precautions Precautions: Fall Restrictions Weight Bearing Restrictions: No    Therapy/Group: Individual Therapy   Excell Seltzer, PT,  DPT  10/23/2019, 12:24 PM

## 2019-10-23 NOTE — Consult Note (Signed)
WOC Nurse Consult Note: Patient receiving care in [facility] [phone].  Spouse in room and updated on wound status and plan of care. Reason for Consult: 2 DTIs on backside and left ankle ulcer Wound type:  Left buttock and left lateral malleolus have unstageable PIs. Pressure Injury POA: No Measurement: sacral - L buttock wound in total measures 7 cm x 7 cm.  A portion of this not specifically located on the left buttock is more consistent with a stage 2 PI. There is surrounding blanchable erythema. The left lateral malleolus unstageable PI measures 2.2 cm x 1.8 cm and is 100% grey slough.  There is a large perimeter of blanchable erythema around the ulcer and the area is very painful to the patient. Wound bed: Drainage (amount, consistency, odor) serosanginous for both Periwound: Dressing procedure/placement/frequency:  Apply Santyl to the left buttock/sacral wound, and the left lateral ankle wound in a nickel thick layer. Cover with a saline moistened gauze, then a size appropriate foam dressing.  Change daily.  I have sent a Secure Chat message to Dr. Lucio Edward and let her know what I found, what my plans are, and that I am concerned there might be osteomyelitis in the left ankle.  Monitor the wound area(s) for worsening of condition such as: Signs/symptoms of infection,  Increase in size,  Development of or worsening of odor, Development of pain, or increased pain at the affected locations.  Notify the medical team if any of these develop.  Thank you for the consult.  Discussed plan of care with the patient and bedside nurse.  Val Riles, RN, MSN, CWOCN, CNS-BC, pager [phone]

## 2019-10-23 NOTE — Progress Notes (Signed)
Occupational Therapy Session Note  Patient Details  Name: DOREEN BALCERZAK MRN: [id number] Date of Birth: 1940-07-19  Today's Date: 10/23/2019 OT Individual Time: [id number] OT Individual Time Calculation (min): 90 min    Short Term Goals: Week 3:  OT Short Term Goal 1 (Week 3): STG=LTG secondary to ELOS  Skilled Therapeutic Interventions/Progress Updates:    Cotx with Recreational Therapist. Pt resting in w/c upon arrival.  Pt's wife present.  OT intervention with focus on discharge planning, DME requirements, home setup/safety, and bed mobility after return to bed for Wound Care Nurse assessment. Pt and wife plan to return home so discussion centered on how to make that happen safely.  Pt owns manual [employer].  Informed wife that additional hands on education with be provided on use. Discussed power w/c option for increased independence as well as independence in pressure relief.  Educated pt and wife on importance of pressure relief.  Pt currently has pressure wound on coccyx. Pt returned to bed with [place] for facilitate assessment by Wound Care Nurse.  Pt +2 for bed mobility-rolling R<>L. Pt remained in bed with all needs within reach and NT present.   Therapy Documentation Precautions:  Precautions Precautions: Fall Restrictions Weight Bearing Restrictions: No  Pain: Pt c/o L ankle pain when repositioning LLE  Therapy/Group: Individual Therapy  [name] 10/23/2019, 12:44 PM

## 2019-10-23 NOTE — Progress Notes (Signed)
RT NOTE: RT found patient on RA with sats of 87-88%. RT placed patient on 2L [place] with sats improving to 96%. Vitals are stable. RT will continue to monitor.

## 2019-10-23 NOTE — Progress Notes (Signed)
Recreational Therapy Session Note  Patient Details  Name: [name] MRN: [id number] Date of Birth: [birth year] Today's Date: 10/23/2019 Time:04-1214 Pain: no c/o Skilled Therapeutic Interventions/Progress Updates: Session focused on discharge planning with pt & wife during cotreat with OT.  Discussion included access to leisure (roasting coffee beans), home measurements & accessibility, (wife given home measurement sheet),  potential modifications, discussion of potential w/c eval.  Wife shared that they already had a manual hoyer lift at home for transfers.  Wound care nurse in at the end of the session to assess wounds.  Pt transferred using maxi move from TIS w/c to bed with +2 assistance.  COTA & LRT assisted pt with bed mobility for wound assessments.  Wife present throughout session.  Therapy/Group: Co-TreatmentSIMPSON,Lacreshia Bondarenko 10/23/2019, 2:19 PM

## 2019-10-23 NOTE — Progress Notes (Signed)
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   Spasms just NOT controlled. Wants ot increase Baclofen again.  Asking about CT myelogram and plan.   ROS:  Pt denies SOB, abd pain, CP, N/V/C/D, and vision changes   Objective:   CT CERVICAL SPINE WO CONTRAST  Result Date: 10/22/2019 CLINICAL DATA:  Worsening spasticity. Spinal stenosis. Consideration of baclofen pump. FLUOROSCOPY TIME:  1.8 minutes.  95.7 mGy air kerma PROCEDURE: LUMBAR PUNCTURE FOR CERVICAL LUMBAR AND THORACIC MYELOGRAM CERVICAL AND LUMBAR AND THORACIC MYELOGRAM CT CERVICAL MYELOGRAM CT LUMBAR MYELOGRAM CT THORACIC MYELOGRAM Written and oral informed consent was obtained. Consent was obtained by Dr. Monte Fantasia. Time-out was performed. Patient was positioned prone on the fluoroscopy table. Local anesthesia was provided with 1% lidocaine without epinephrine after prepped and draped in the usual sterile fashion. Puncture was performed at L5-S1 using a 3 1/2 inch 22-gauge spinal needle via midline approach. Using a single pass through the dura, the needle was placed within the thecal sac, with return of clear CSF. 10 mL Omnipaque 300 was injected into the thecal sac, with normal opacification of the nerve roots and cauda equina consistent with free flow within the subarachnoid space. Patient cannot stand or or easily positioned on the table due to spasms and joint flexion in the lower extremity. Contrast was advanced into the cervical region in the lateral decubitus position. I personally performed the lumbar puncture and administered the intrathecal contrast. I also personally supervised acquisition of the myelogram images. TECHNIQUE: Contiguous axial images were obtained through the Cervical, Thoracic, and Lumbar spine after the intrathecal infusion of infusion. Coronal and sagittal  reconstructions were obtained of the axial image sets. FINDINGS: CERVICAL AND LUMBAR MYELOGRAM FINDINGS: Myelography was only used for positioning of intrathecal contrast, which flowed freely into the upper cervical spine. L3-4 severe disc space degeneration with vacuum phenomenon and disc space obliteration. There is severe stenosis at this level. CT CERVICAL MYELOGRAM FINDINGS: Alignment: Mild anterolisthesis C6-7 Vertebrae: C3-C6 ACDF with solid arthrodesis. Bridging osteophytes ankylosis the C6-7 and C7-T1 spaces. There is also ankylosis across the right facet and intervertebral space of C2-3. Cord: Suboptimal intrathecal contrast which has layered since myelography. 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 Extraspinal: No evidence of inflammation or mass.  Thyroidectomy. Disc levels: C1-2: Advanced right-sided facet osteoarthritis with joint space narrowing and potential C2 impingement. C2-3: Ankylosis.  No visible impingement C3-4: ACDF. Patent canal. Residual ridging causes moderate left foraminal narrowing. C4-5: ACDF with solid arthrodesis. Patent spinal canal. The foramina appear sufficiently patent C5-6: ACDF. Uncovertebral and facet spurring causes moderate right foraminal narrowing. 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 C6-7: Facet spurring and anterolisthesis. Disc narrowing. Mild bilateral foraminal narrowing. Patent spinal canal C7-T1:Facet spurring and ankylosis. Bridging intervertebral ankylosis. CT THORACIC MYELOGRAM FINDINGS: Alignment: Exaggerated thoracic kyphosis. Vertebrae: No evidence of fracture or bone lesion. Cord: Suboptimal intrathecal contrast in the upper thoracic levels. No cord compression. Facet spurring in the lower thoracic levels mildly encroaches on the posterolateral cord. Extra-spinal: Left lower lobe and multi segment left upper lobe collapse with large  pleural effusion. There is also right-sided atelectasis and volume loss. Cardiomegaly. Disc levels: Diffuse degenerative disc narrowing and ventral spurring. Bridging osteophytes are seen at multiple levels, including T6-7, T8-9, and T10-11.  Facet spurs encroach on lower thoracic foramina without focal high-grade involvement CT LUMBAR MYELOGRAM FINDINGS: Segmentation: 5 lumbar type vertebrae. Alignment: Mild dextroscoliosis. Trace retrolisthesis at L2-3 and L3-4. Vertebrae: Extensive vertebral body sclerosis attributed to degenerative disease. No evidence of fracture or bone lesion. Extra-spinal: Extensive atherosclerotic plaque on the aorta. Conus: Tip terminates at L1-2.  No detected swelling Disc levels: T12- L1: Spondylosis.  No impingement L1-L2: Disc narrowing and bulging with endplate ridging. Moderate spinal stenosis. L2-L3: Degenerative disc collapse with endplate sclerosis and posterior ridging. High-grade spinal stenosis. Moderate left more than right foraminal narrowing L3-L4: Severe disc degeneration with disc space obliteration and ridging. Bilateral facet spurring and ligamentum flavum thickening. Advanced spinal stenosis. Right more than left foraminal impingement L4-L5: Advanced disc narrowing with right-sided collapse and endplate ridging. Facet osteoarthritis with bulky spurring on the right where there is ankylosis. Left more than right subarticular recess narrowing. Biforaminal impingement. L5-S1:Disc narrowing and ridging. Right facet osteoarthritis. Moderate right foraminal narrowing. IMPRESSION: Lumbar spine: 1. 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 2. 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 3. L2-3 moderate to advanced spinal stenosis. 4. L1-2 moderate spinal stenosis. 5. Bilateral foraminal narrowings described above. Cervical spine: 1. Resolved cord compression after ACDF. There is solid arthrodesis from C3-C6 and degenerative ankylosis at C2-3, C6-7, and C7-T1. 2. Bilateral  foraminal narrowings described above. 3. Large left pleural effusion with collapse of the majority of the left lung. Thoracic spine: 1. Diffuse degenerative disease without cord compression. 2. Multilevel spondylitic ankylosis. Electronically Signed   By: Monte Fantasia M.D.   On: 10/22/2019 09:43   CT THORACIC SPINE WO CONTRAST  Result Date: 10/22/2019 CLINICAL DATA:  Worsening spasticity. Spinal stenosis. Consideration of baclofen pump. FLUOROSCOPY TIME:  1.8 minutes.  95.7 mGy air kerma PROCEDURE: LUMBAR PUNCTURE FOR CERVICAL LUMBAR AND THORACIC MYELOGRAM CERVICAL AND LUMBAR AND THORACIC MYELOGRAM CT CERVICAL MYELOGRAM CT LUMBAR MYELOGRAM CT THORACIC MYELOGRAM Written and oral informed consent was obtained. Consent was obtained by Dr. Monte Fantasia. Time-out was performed. Patient was positioned prone on the fluoroscopy table. Local anesthesia was provided with 1% lidocaine without epinephrine after prepped and draped in the usual sterile fashion. Puncture was performed at L5-S1 using a 3 1/2 inch 22-gauge spinal needle via midline approach. Using a single pass through the dura, the needle was placed within the thecal sac, with return of clear CSF. 10 mL Omnipaque 300 was injected into the thecal sac, with normal opacification of the nerve roots and cauda equina consistent with free flow within the subarachnoid space. Patient cannot stand or or easily positioned on the table due to spasms and joint flexion in the lower extremity. Contrast was advanced into the cervical region in the lateral decubitus position. I personally performed the lumbar puncture and administered the intrathecal contrast. I also personally supervised acquisition of the myelogram images. TECHNIQUE: Contiguous axial images were obtained through the Cervical, Thoracic, and Lumbar spine after the intrathecal infusion of infusion. Coronal and sagittal reconstructions were obtained of the axial image sets. FINDINGS: CERVICAL AND LUMBAR  MYELOGRAM FINDINGS: Myelography was only used for positioning of intrathecal contrast, which flowed freely into the upper cervical spine. L3-4 severe disc space degeneration with vacuum phenomenon and disc space obliteration. There is severe stenosis at this level. CT CERVICAL MYELOGRAM FINDINGS: Alignment: Mild anterolisthesis C6-7 Vertebrae: C3-C6 ACDF with solid arthrodesis. Bridging osteophytes ankylosis the C6-7 and C7-T1 spaces. There is also ankylosis across the right facet and intervertebral space of C2-3. Cord: Suboptimal intrathecal contrast  which has layered since myelography. 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 Extraspinal: No evidence of inflammation or mass.  Thyroidectomy. Disc levels: C1-2: Advanced right-sided facet osteoarthritis with joint space narrowing and potential C2 impingement. C2-3: Ankylosis.  No visible impingement C3-4: ACDF. Patent canal. Residual ridging causes moderate left foraminal narrowing. C4-5: ACDF with solid arthrodesis. Patent spinal canal. The foramina appear sufficiently patent C5-6: ACDF. Uncovertebral and facet spurring causes moderate right foraminal narrowing. 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 C6-7: Facet spurring and anterolisthesis. Disc narrowing. Mild bilateral foraminal narrowing. Patent spinal canal C7-T1:Facet spurring and ankylosis. Bridging intervertebral ankylosis. CT THORACIC MYELOGRAM FINDINGS: Alignment: Exaggerated thoracic kyphosis. Vertebrae: No evidence of fracture or bone lesion. Cord: Suboptimal intrathecal contrast in the upper thoracic levels. No cord compression. Facet spurring in the lower thoracic levels mildly encroaches on the posterolateral cord. Extra-spinal: Left lower lobe and multi segment left upper lobe collapse with large pleural effusion. There is also right-sided atelectasis and volume loss.  Cardiomegaly. Disc levels: Diffuse degenerative disc narrowing and ventral spurring. Bridging osteophytes are seen at multiple levels, including T6-7, T8-9, and T10-11. Facet spurs encroach on lower thoracic foramina without focal high-grade involvement CT LUMBAR MYELOGRAM FINDINGS: Segmentation: 5 lumbar type vertebrae. Alignment: Mild dextroscoliosis. Trace retrolisthesis at L2-3 and L3-4. Vertebrae: Extensive vertebral body sclerosis attributed to degenerative disease. No evidence of fracture or bone lesion. Extra-spinal: Extensive atherosclerotic plaque on the aorta. Conus: Tip terminates at L1-2.  No detected swelling Disc levels: T12- L1: Spondylosis.  No impingement L1-L2: Disc narrowing and bulging with endplate ridging. Moderate spinal stenosis. L2-L3: Degenerative disc collapse with endplate sclerosis and posterior ridging. High-grade spinal stenosis. Moderate left more than right foraminal narrowing L3-L4: Severe disc degeneration with disc space obliteration and ridging. Bilateral facet spurring and ligamentum flavum thickening. Advanced spinal stenosis. Right more than left foraminal impingement L4-L5: Advanced disc narrowing with right-sided collapse and endplate ridging. Facet osteoarthritis with bulky spurring on the right where there is ankylosis. Left more than right subarticular recess narrowing. Biforaminal impingement. L5-S1:Disc narrowing and ridging. Right facet osteoarthritis. Moderate right foraminal narrowing. IMPRESSION: Lumbar spine: 1. 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 2. 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 3. L2-3 moderate to advanced spinal stenosis. 4. L1-2 moderate spinal stenosis. 5. Bilateral foraminal narrowings described above. Cervical spine: 1. Resolved cord compression after ACDF. There is solid arthrodesis from C3-C6 and degenerative ankylosis at C2-3, C6-7, and C7-T1. 2. Bilateral foraminal narrowings described above. 3. Large left pleural effusion with  collapse of the majority of the left lung. Thoracic spine: 1. Diffuse degenerative disease without cord compression. 2. Multilevel spondylitic ankylosis. Electronically Signed   By: Monte Fantasia M.D.   On: 10/22/2019 09:43   CT LUMBAR SPINE WO CONTRAST  Result Date: 10/22/2019 CLINICAL DATA:  Worsening spasticity. Spinal stenosis. Consideration of baclofen pump. FLUOROSCOPY TIME:  1.8 minutes.  95.7 mGy air kerma PROCEDURE: LUMBAR PUNCTURE FOR CERVICAL LUMBAR AND THORACIC MYELOGRAM CERVICAL AND LUMBAR AND THORACIC MYELOGRAM CT CERVICAL MYELOGRAM CT LUMBAR MYELOGRAM CT THORACIC MYELOGRAM Written and oral informed consent was obtained. Consent was obtained by Dr. Monte Fantasia. Time-out was performed. Patient was positioned prone on the fluoroscopy table. Local anesthesia was provided with 1% lidocaine without epinephrine after prepped and draped in the usual sterile fashion. Puncture was performed at L5-S1 using a 3 1/2 inch 22-gauge spinal needle via midline approach. Using a single pass through the dura, the  needle was placed within the thecal sac, with return of clear CSF. 10 mL Omnipaque 300 was injected into the thecal sac, with normal opacification of the nerve roots and cauda equina consistent with free flow within the subarachnoid space. Patient cannot stand or or easily positioned on the table due to spasms and joint flexion in the lower extremity. Contrast was advanced into the cervical region in the lateral decubitus position. I personally performed the lumbar puncture and administered the intrathecal contrast. I also personally supervised acquisition of the myelogram images. TECHNIQUE: Contiguous axial images were obtained through the Cervical, Thoracic, and Lumbar spine after the intrathecal infusion of infusion. Coronal and sagittal reconstructions were obtained of the axial image sets. FINDINGS: CERVICAL AND LUMBAR MYELOGRAM FINDINGS: Myelography was only used for positioning of intrathecal  contrast, which flowed freely into the upper cervical spine. L3-4 severe disc space degeneration with vacuum phenomenon and disc space obliteration. There is severe stenosis at this level. CT CERVICAL MYELOGRAM FINDINGS: Alignment: Mild anterolisthesis C6-7 Vertebrae: C3-C6 ACDF with solid arthrodesis. Bridging osteophytes ankylosis the C6-7 and C7-T1 spaces. There is also ankylosis across the right facet and intervertebral space of C2-3. Cord: Suboptimal intrathecal contrast which has layered since myelography. 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 Extraspinal: No evidence of inflammation or mass.  Thyroidectomy. Disc levels: C1-2: Advanced right-sided facet osteoarthritis with joint space narrowing and potential C2 impingement. C2-3: Ankylosis.  No visible impingement C3-4: ACDF. Patent canal. Residual ridging causes moderate left foraminal narrowing. C4-5: ACDF with solid arthrodesis. Patent spinal canal. The foramina appear sufficiently patent C5-6: ACDF. Uncovertebral and facet spurring causes moderate right foraminal narrowing. 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 C6-7: Facet spurring and anterolisthesis. Disc narrowing. Mild bilateral foraminal narrowing. Patent spinal canal C7-T1:Facet spurring and ankylosis. Bridging intervertebral ankylosis. CT THORACIC MYELOGRAM FINDINGS: Alignment: Exaggerated thoracic kyphosis. Vertebrae: No evidence of fracture or bone lesion. Cord: Suboptimal intrathecal contrast in the upper thoracic levels. No cord compression. Facet spurring in the lower thoracic levels mildly encroaches on the posterolateral cord. Extra-spinal: Left lower lobe and multi segment left upper lobe collapse with large pleural effusion. There is also right-sided atelectasis and volume loss. Cardiomegaly. Disc levels: Diffuse degenerative disc narrowing and ventral spurring.  Bridging osteophytes are seen at multiple levels, including T6-7, T8-9, and T10-11. Facet spurs encroach on lower thoracic foramina without focal high-grade involvement CT LUMBAR MYELOGRAM FINDINGS: Segmentation: 5 lumbar type vertebrae. Alignment: Mild dextroscoliosis. Trace retrolisthesis at L2-3 and L3-4. Vertebrae: Extensive vertebral body sclerosis attributed to degenerative disease. No evidence of fracture or bone lesion. Extra-spinal: Extensive atherosclerotic plaque on the aorta. Conus: Tip terminates at L1-2.  No detected swelling Disc levels: T12- L1: Spondylosis.  No impingement L1-L2: Disc narrowing and bulging with endplate ridging. Moderate spinal stenosis. L2-L3: Degenerative disc collapse with endplate sclerosis and posterior ridging. High-grade spinal stenosis. Moderate left more than right foraminal narrowing L3-L4: Severe disc degeneration with disc space obliteration and ridging. Bilateral facet spurring and ligamentum flavum thickening. Advanced spinal stenosis. Right more than left foraminal impingement L4-L5: Advanced disc narrowing with right-sided collapse and endplate ridging. Facet osteoarthritis with bulky spurring on the right where there is ankylosis. Left more than right subarticular recess narrowing. Biforaminal impingement. L5-S1:Disc narrowing and ridging. Right facet osteoarthritis. Moderate right foraminal narrowing. IMPRESSION: Lumbar spine: 1. 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 2. 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 3. L2-3 moderate to advanced spinal stenosis.  4. L1-2 moderate spinal stenosis. 5. Bilateral foraminal narrowings described above. Cervical spine: 1. Resolved cord compression after ACDF. There is solid arthrodesis from C3-C6 and degenerative ankylosis at C2-3, C6-7, and C7-T1. 2. Bilateral foraminal narrowings described above. 3. Large left pleural effusion with collapse of the majority of the left lung. Thoracic spine: 1. Diffuse degenerative  disease without cord compression. 2. Multilevel spondylitic ankylosis. Electronically Signed   By: Monte Fantasia M.D.   On: 10/22/2019 09:43   DG MYELOGRAPHY LUMBAR INJ MULTI REGION  Result Date: 10/22/2019 CLINICAL DATA:  Worsening spasticity. Spinal stenosis. Consideration of baclofen pump. FLUOROSCOPY TIME:  1.8 minutes.  95.7 mGy air kerma PROCEDURE: LUMBAR PUNCTURE FOR CERVICAL LUMBAR AND THORACIC MYELOGRAM CERVICAL AND LUMBAR AND THORACIC MYELOGRAM CT CERVICAL MYELOGRAM CT LUMBAR MYELOGRAM CT THORACIC MYELOGRAM Written and oral informed consent was obtained. Consent was obtained by Dr. Monte Fantasia. Time-out was performed. Patient was positioned prone on the fluoroscopy table. Local anesthesia was provided with 1% lidocaine without epinephrine after prepped and draped in the usual sterile fashion. Puncture was performed at L5-S1 using a 3 1/2 inch 22-gauge spinal needle via midline approach. Using a single pass through the dura, the needle was placed within the thecal sac, with return of clear CSF. 10 mL Omnipaque 300 was injected into the thecal sac, with normal opacification of the nerve roots and cauda equina consistent with free flow within the subarachnoid space. Patient cannot stand or or easily positioned on the table due to spasms and joint flexion in the lower extremity. Contrast was advanced into the cervical region in the lateral decubitus position. I personally performed the lumbar puncture and administered the intrathecal contrast. I also personally supervised acquisition of the myelogram images. TECHNIQUE: Contiguous axial images were obtained through the Cervical, Thoracic, and Lumbar spine after the intrathecal infusion of infusion. Coronal and sagittal reconstructions were obtained of the axial image sets. FINDINGS: CERVICAL AND LUMBAR MYELOGRAM FINDINGS: Myelography was only used for positioning of intrathecal contrast, which flowed freely into the upper cervical spine. L3-4 severe  disc space degeneration with vacuum phenomenon and disc space obliteration. There is severe stenosis at this level. CT CERVICAL MYELOGRAM FINDINGS: Alignment: Mild anterolisthesis C6-7 Vertebrae: C3-C6 ACDF with solid arthrodesis. Bridging osteophytes ankylosis the C6-7 and C7-T1 spaces. There is also ankylosis across the right facet and intervertebral space of C2-3. Cord: Suboptimal intrathecal contrast which has layered since myelography. 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 Extraspinal: No evidence of inflammation or mass.  Thyroidectomy. Disc levels: C1-2: Advanced right-sided facet osteoarthritis with joint space narrowing and potential C2 impingement. C2-3: Ankylosis.  No visible impingement C3-4: ACDF. Patent canal. Residual ridging causes moderate left foraminal narrowing. C4-5: ACDF with solid arthrodesis. Patent spinal canal. The foramina appear sufficiently patent C5-6: ACDF. Uncovertebral and facet spurring causes moderate right foraminal narrowing. 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 C6-7: Facet spurring and anterolisthesis. Disc narrowing. Mild bilateral foraminal narrowing. Patent spinal canal C7-T1:Facet spurring and ankylosis. Bridging intervertebral ankylosis. CT THORACIC MYELOGRAM FINDINGS: Alignment: Exaggerated thoracic kyphosis. Vertebrae: No evidence of fracture or bone lesion. Cord: Suboptimal intrathecal contrast in the upper thoracic levels. No cord compression. Facet spurring in the lower thoracic levels mildly encroaches on the posterolateral cord. Extra-spinal: Left lower lobe and multi segment left upper lobe collapse with large pleural effusion. There is also right-sided atelectasis and volume loss. Cardiomegaly. Disc levels: Diffuse degenerative disc narrowing and ventral spurring.  Bridging osteophytes are seen at multiple levels, including T6-7, T8-9, and  T10-11. Facet spurs encroach on lower thoracic foramina without focal high-grade involvement CT LUMBAR MYELOGRAM FINDINGS: Segmentation: 5 lumbar type vertebrae. Alignment: Mild dextroscoliosis. Trace retrolisthesis at L2-3 and L3-4. Vertebrae: Extensive vertebral body sclerosis attributed to degenerative disease. No evidence of fracture or bone lesion. Extra-spinal: Extensive atherosclerotic plaque on the aorta. Conus: Tip terminates at L1-2.  No detected swelling Disc levels: T12- L1: Spondylosis.  No impingement L1-L2: Disc narrowing and bulging with endplate ridging. Moderate spinal stenosis. L2-L3: Degenerative disc collapse with endplate sclerosis and posterior ridging. High-grade spinal stenosis. Moderate left more than right foraminal narrowing L3-L4: Severe disc degeneration with disc space obliteration and ridging. Bilateral facet spurring and ligamentum flavum thickening. Advanced spinal stenosis. Right more than left foraminal impingement L4-L5: Advanced disc narrowing with right-sided collapse and endplate ridging. Facet osteoarthritis with bulky spurring on the right where there is ankylosis. Left more than right subarticular recess narrowing. Biforaminal impingement. L5-S1:Disc narrowing and ridging. Right facet osteoarthritis. Moderate right foraminal narrowing. IMPRESSION: Lumbar spine: 1. 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 2. 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 3. L2-3 moderate to advanced spinal stenosis. 4. L1-2 moderate spinal stenosis. 5. Bilateral foraminal narrowings described above. Cervical spine: 1. Resolved cord compression after ACDF. There is solid arthrodesis from C3-C6 and degenerative ankylosis at C2-3, C6-7, and C7-T1. 2. Bilateral foraminal narrowings described above. 3. Large left pleural effusion with collapse of the majority of the left lung. Thoracic spine: 1. Diffuse degenerative disease without cord compression. 2. Multilevel spondylitic ankylosis.  Electronically Signed   By: Monte Fantasia M.D.   On: 10/22/2019 09:43   No results for input(s): WBC, HGB, HCT, PLT in the last 72 hours. No results for input(s): NA, K, CL, CO2, GLUCOSE, BUN, CREATININE, CALCIUM in the last 72 hours.  Intake/Output Summary (Last 24 hours) at 10/23/2019 0957 Last data filed at 10/23/2019 0920 Gross per 24 hour  Intake 240 ml  Output 2300 ml  Net -2060 ml     Physical Exam: Vital Signs Blood pressure (!) 150/78, pulse 75, temperature 97.9 F (36.6 C), resp. rate 18, height 5\' 7"  (1.702 m), weight 92.5 kg, SpO2 96 %. Constitutional: pt sitting up but in slight supine in air bed, on CPAP , NAD- got off CPAP  HENT: conjugate gaze. Cardiovascular: RRR Respiratory:prenebs- a little better coarseness and wheezing for AM.  GI: Soft, NT, ND, (+)BS   Skin: saw large DTI on L buttock- waiting to see coccyx which also has DTI per nursing- has bowel incontinence-  Psych:calmer  Musc: No edema in extremities.  No tenderness in extremities. Neurologic:spasms still every 20 seconds or so- even after AM baclofen.  Motor:  Left lower extremity: 2/5 proximal distal, unchanged Right lower extremity: 1/5 proximal distal- LE exam unchanged.   Assessment/Plan: 1. Functional deficits secondary to lumbar spinal stenosis with neurogenic claudication which require 3+ hours per day of interdisciplinary therapy in a comprehensive inpatient rehab setting.  Physiatrist is providing close team supervision and 24 hour management of active medical problems listed below.  Physiatrist and rehab team continue to assess barriers to discharge/monitor patient progress toward functional and medical goals  Care Tool:  Bathing    Body parts bathed by patient: Chest, Abdomen, Face   Body parts bathed by helper: Buttocks     Bathing assist Assist Level: Maximal Assistance - Patient 24 - 49%     Upper Body Dressing/Undressing Upper body dressing  What is the patient wearing?:  Pull over shirt    Upper body assist Assist Level: Moderate Assistance - Patient 50 - 74%    Lower Body Dressing/Undressing Lower body dressing      What is the patient wearing?: Pants, Underwear/pull up     Lower body assist Assist for lower body dressing: 2 Helpers     Toileting Toileting    Toileting assist Assist for toileting: Dependent - Patient 0%     Transfers Chair/bed transfer  Transfers assist     Chair/bed transfer assist level: Dependent - mechanical lift     Locomotion Ambulation   Ambulation assist   Ambulation activity did not occur: Safety/medical concerns  Assist level: 2 helpers Assistive device: Parallel bars Max distance: 5'   Walk 10 feet activity   Assist  Walk 10 feet activity did not occur: Safety/medical concerns        Walk 50 feet activity   Assist Walk 50 feet with 2 turns activity did not occur: Safety/medical concerns         Walk 150 feet activity   Assist Walk 150 feet activity did not occur: Safety/medical concerns         Walk 10 feet on uneven surface  activity   Assist Walk 10 feet on uneven surfaces activity did not occur: Safety/medical concerns         Wheelchair     Assist Will patient use wheelchair at discharge?: Yes Type of Wheelchair: Manual    Wheelchair assist level: Supervision/Verbal cueing Max wheelchair distance: 27'    Wheelchair 50 feet with 2 turns activity    Assist        Assist Level: Supervision/Verbal cueing   Wheelchair 150 feet activity     Assist      Assist Level: Supervision/Verbal cueing   Blood pressure (!) 150/78, pulse 75, temperature 97.9 F (36.6 C), resp. rate 18, height 5\' 7"  (1.702 m), weight 92.5 kg, SpO2 96 %.    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  Per neurosurgery, follow-up with outpatient after NCS/EMG.  Neurology consulted recommended management of spasticity and spasms with outpatient NCS/EMG  5/3- Asked Neuro to come back for recs- but  they recommended was what we discussed, a ITB pump, which cannot be placed inpt. Didn't address NSU note at all.   5/4- will get MRI of cervical spine- since cannot find anything else to focus on-  5/5- spoke with [facility] and Neurology- decided on doing CT myelogram of entire spine-ordered and to go this AM  5/6- will call Dr Zada Finders about ITB pump and CT myelogram plan-    Continue CIR 2. Antithrombotics: -DVT/anticoagulation:Lovenox.   4/12- Dopplers (-)- con't lovenox -antiplatelet therapy: Aspirin  81 mg daily 3. Pain Management:Neurontin 100 mg nightly, OxyContin 10 mg every 12 hours, hydrocodone as needed as well as Imitrex for headaches  baclofen to 15 mg TID- with meals and 20 mg QHS, increase nighttime dose to 30mg  on 5/1 with improvement in resting spasms  Tizanidine 2 mg 3 times daily  5/3- changed breakfast time Baclofen to 0600 since spasms so bad in AM-   5/4- increase baclofen to 20 mg TID and 30 mg QHS  5/5- spasms no better-   5/6- increased baclofen a lot to 30 mg TID and 40 mg QHS- so on 130 mg/day- can't increase much more.  4. Mood:Provide emotional support -antipsychotic  agents: N/A 5. Neuropsych: This patientiscapable of making decisions on hisown behalf. 6. Skin/Wound Care:place steristrips over right index finger lac.                          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  Foam dressing left knee  Barrier cream MASD on sacrum  5/3- using Prevalon boots  5/6- will call [facility] to help- also on air bed.  7. Fluids/Electrolytes/Nutrition:Routine in and outs  Calcium 8.5 on 4/26-started calcium supplement, labs ordered for tomorrow 8. Chronic restless leg syndrome. Sinemet 10-100 mg twice daily.  See #3 9. 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 OSA with CPAP/2 L chronic oxygen at home. Continue inhalers 11. 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 Follow-up outpatient 12. Diabetes mellitus. Hemoglobin A1c 5.8. SSI. Changed to [facility] HS for CBG and SSI  HbA1c 5.8- 13. Neurogenic bowel and bladder. Check PVR  Incontinent BMs 14. Hypothyroidism.  Continue Synthroid 15. Chronic diastolic congestive heart failure. Monitor for any signs of fluid overload. Check regular weights   Filed Weights   10/19/19 0513 10/21/19 0500 10/22/19 0506  Weight: 95.8 kg 98.7 kg 92.5 kg   5/4- up to 98.7 kg- up 3 kg in a 2 days- will monitor  5/6- weight down to 92.5 kg- down a lot? 16. Hyperlipidemia. Zocor 17. Code status: Changed to DNR as per patient's wishes. 18. Leukocytosis - pneumonia- Resolved 19. Wheezing  Resolved on abx, scheduled umeclidinium  4/28- pulmonary saw pt- maximized meds  5/4- improved this AM- feeling SOB when has spasms- thinks it affects diaphragm- can see that 20 Spasticity vs tone in hamstrings, chronically WC  bound may be contracted  See #3 21. Neurogenic bladder  4/28-started Flomax 0.4 mg daily and place foley per wishes of pt.   5/3- will increase flomax to 0.8 mg to try and see if will void once foley try to remove.  22.  Labile blood pressure  Monitor for trend   LOS: 27 days A FACE TO FACE EVALUATION WAS PERFORMED  Edris Schneck 10/23/2019, 9:57 AM

## 2019-10-23 NOTE — Progress Notes (Addendum)
Upon assessment, this nurse noticed worsening area of MASD on L buttocks, suspected to be a pressure ulcer. Charge nurse and Aura Dials, notified and asked to assess. Confirmed unstageable pressure injury to L coccyx. Lysle Morales, RN measured and dressed wound. Air mattress ordered for patient for pressure relief measures. Linna Hoff, [place] notified. Patient's wife, [name], notified this AM.

## 2019-10-23 NOTE — Plan of Care (Signed)
Problem: RH Balance Goal: LTG: Patient will maintain dynamic sitting balance (OT) Description: LTG:  Patient will maintain dynamic sitting balance with assistance during activities of daily living (OT) Flowsheets (Taken 10/23/2019 0823) LTG: Pt will maintain dynamic sitting balance during ADLs with: (downgraded JLS) Supervision/Verbal cueing Note: downgraded JLS Goal: LTG Patient will maintain dynamic standing with ADLs (OT) Description: LTG:  Patient will maintain dynamic standing balance with assist during activities of daily living (OT)  Outcome: Not Applicable Flowsheets (Taken 10/23/2019 0823) LTG: Pt will maintain dynamic standing balance during ADLs with: (d/c goal) -- Note: D/c goal at this time   Problem: RH Balance Goal: LTG Patient will maintain dynamic standing with ADLs (OT) Description: LTG:  Patient will maintain dynamic standing balance with assist during activities of daily living (OT)  Outcome: Not Applicable Flowsheets (Taken 10/23/2019 0823) LTG: Pt will maintain dynamic standing balance during ADLs with: (d/c goal) -- Note: D/c goal at this time   Problem: Sit to Stand Goal: LTG:  Patient will perform sit to stand in prep for activites of daily living with assistance level (OT) Description: LTG:  Patient will perform sit to stand in prep for activites of daily living with assistance level (OT) Outcome: Not Applicable Flowsheets (Taken 10/23/2019 0823) LTG: PT will perform sit to stand in prep for activites of daily living with assistance level: (d/c goal) -- Note: D/c goal at this time    Problem: RH Bathing Goal: LTG Patient will bathe all body parts with assist levels (OT) Description: LTG: Patient will bathe all body parts with assist levels (OT) Flowsheets (Taken 10/23/2019 0823) LTG: Pt will perform bathing with assistance level/cueing: (downgraded JLS) Moderate Assistance - Patient 50 - 74% Note: downgraded JLS   Problem: RH Dressing Goal: LTG Patient will  perform upper body dressing (OT) Description: LTG Patient will perform upper body dressing with assist, with/without cues (OT). Flowsheets (Taken 10/23/2019 [phone]) LTG: Pt will perform upper body dressing with assistance level of: (downgraded JLS) Minimal Assistance - Patient > 75% Note: downgraded JLS Goal: LTG Patient will perform lower body dressing w/assist (OT) Description: LTG: Patient will perform lower body dressing with assist, with/without cues in positioning using equipment (OT) Flowsheets (Taken 10/23/2019 0823) LTG: Pt will perform lower body dressing with assistance level of: (downgraded JLS) Maximal Assistance - Patient 25 - 49% Note: downgraded JLS   Problem: RH Toileting Goal: LTG Patient will perform toileting task (3/3 steps) with assistance level (OT) Description: LTG: Patient will perform toileting task (3/3 steps) with assistance level (OT)  Flowsheets (Taken 10/23/2019 0823) LTG: Pt will perform toileting task (3/3 steps) with assistance level: (d/c goal) -- Note: D/c goal    Problem: RH Toilet Transfers Goal: LTG Patient will perform toilet transfers w/assist (OT) Description: LTG: Patient will perform toilet transfers with assist, with/without cues using equipment (OT) Flowsheets (Taken 10/23/2019 0823) LTG: Pt will perform toilet transfers with assistance level of: (downgraded JLS) Maximal Assistance - Patient 25 - 49% Note: downgraded JLS   Problem: RH Tub/Shower Transfers Goal: LTG Patient will perform tub/shower transfers w/assist (OT) Description: LTG: Patient will perform tub/shower transfers with assist, with/without cues using equipment (OT) Outcome: Not Applicable Flowsheets (Taken 10/23/2019 0823) LTG: Pt will perform tub/shower stall transfers with assistance level of: (downgraded JLS) -- Note: D/c goal at this time   Problem: RH Pre-functional/Other (Specify) Goal: RH LTG OT (Specify) 1 Description: RH LTG OT (Specify) 1 Flowsheets (Taken 10/23/2019  0828) LTG: Other OT (Specify) 1: Pt will assist  with bed mobility with A for toileting and lower body dressing Note: New goal

## 2019-10-24 ENCOUNTER — Inpatient Hospital Stay (HOSPITAL_COMMUNITY): Payer: Medicare Other

## 2019-10-24 ENCOUNTER — Inpatient Hospital Stay (HOSPITAL_COMMUNITY): Payer: Medicare Other | Admitting: *Deleted

## 2019-10-24 IMAGING — DX DG CHEST 1V PORT
1 series · 1 of 1 positions shown · non-contrast
Comparison: [DATE]

CLINICAL DATA: Wheezing, cough

EXAM:
PORTABLE CHEST 1 VIEW

[chest]
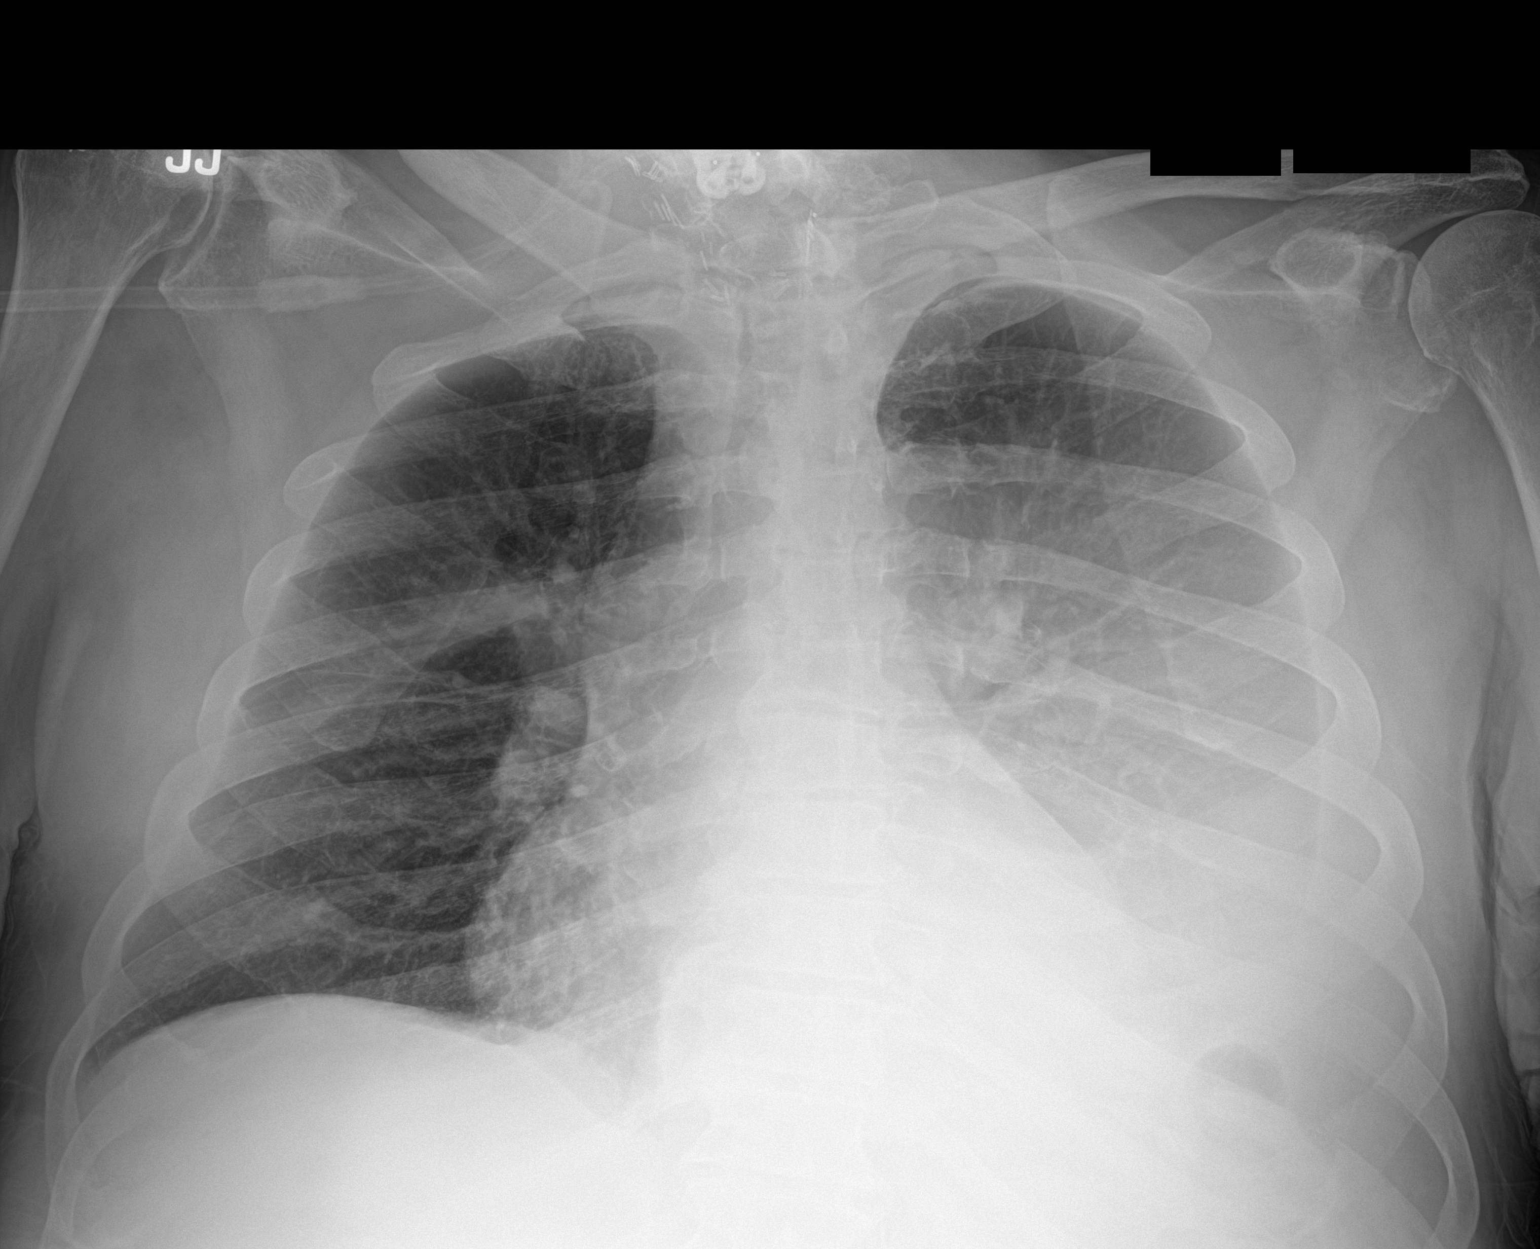

[1 of 1 positions shown; findings below may reference images not displayed]

FINDINGS: Persistent left pleural effusion with associated atelectasis.
Improved aeration at the right lung base. Similar cardiomediastinal
contours. Surgical clips overlie the neck. Anterior cervical fusion
is present. Right rotator cuff repair.
IMPRESSION: Persistent left pleural effusion with associated atelectasis.

## 2019-10-24 MED ORDER — FLUTICASONE FUROATE-VILANTEROL 200-25 MCG/INH IN AEPB: 1.0000 | INHALATION_SPRAY | Freq: Every day | RESPIRATORY_TRACT | Status: DC

## 2019-10-24 MED ORDER — ACETAMINOPHEN 325 MG PO TABS: 650.0000 mg | ORAL_TABLET | Freq: Four times a day (QID) | ORAL | Status: DC | PRN

## 2019-10-24 MED ORDER — TAMSULOSIN HCL 0.4 MG PO CAPS: 0.8000 mg | ORAL_CAPSULE | Freq: Every day | ORAL | Status: DC

## 2019-10-24 MED ORDER — LATANOPROST 0.005 % OP SOLN: 1.0000 [drp] | Freq: Every day | OPHTHALMIC | 12 refills | Status: DC

## 2019-10-24 MED ORDER — TIZANIDINE HCL 2 MG PO TABS: 2.0000 mg | ORAL_TABLET | Freq: Three times a day (TID) | ORAL | 0 refills | Status: DC

## 2019-10-24 MED ORDER — GUAIFENESIN ER 600 MG PO TB12: 1200.0000 mg | ORAL_TABLET | Freq: Two times a day (BID) | ORAL | Status: DC

## 2019-10-24 MED ORDER — ASPIRIN 81 MG PO TBEC: 81.0000 mg | DELAYED_RELEASE_TABLET | Freq: Every day | ORAL | Status: DC

## 2019-10-24 MED ORDER — SUMATRIPTAN SUCCINATE 50 MG PO TABS: 50.0000 mg | ORAL_TABLET | ORAL | 0 refills | Status: DC | PRN

## 2019-10-24 MED ORDER — BACLOFEN 20 MG PO TABS: 40.0000 mg | ORAL_TABLET | Freq: Every day | ORAL | 0 refills | Status: DC

## 2019-10-24 MED ORDER — CALCIUM CITRATE 950 (200 CA) MG PO TABS: 200.0000 mg | ORAL_TABLET | Freq: Every day | ORAL | Status: DC

## 2019-10-24 MED ORDER — ALBUTEROL SULFATE (2.5 MG/3ML) 0.083% IN NEBU: 2.5000 mg | INHALATION_SOLUTION | RESPIRATORY_TRACT | 12 refills | Status: DC | PRN

## 2019-10-24 MED ORDER — OXYCODONE HCL ER 10 MG PO T12A: 10.0000 mg | EXTENDED_RELEASE_TABLET | Freq: Two times a day (BID) | ORAL | 0 refills | Status: DC

## 2019-10-24 MED ORDER — ALBUTEROL SULFATE (2.5 MG/3ML) 0.083% IN NEBU
2.5000 mg | INHALATION_SOLUTION | Freq: Two times a day (BID) | RESPIRATORY_TRACT | Status: DC
  Administered 2019-10-24 – 2019-10-26 (×5): 2.5 mg via RESPIRATORY_TRACT
  Filled 2019-10-24 (×5): qty 3

## 2019-10-24 MED ORDER — UMECLIDINIUM BROMIDE 62.5 MCG/INH IN AEPB: 1.0000 | INHALATION_SPRAY | Freq: Every day | RESPIRATORY_TRACT | Status: DC

## 2019-10-24 MED ORDER — ENOXAPARIN SODIUM 40 MG/0.4ML ~~LOC~~ SOLN: 40.0000 mg | Freq: Every day | SUBCUTANEOUS | Status: DC

## 2019-10-24 MED ORDER — COLLAGENASE 250 UNIT/GM EX OINT: TOPICAL_OINTMENT | Freq: Every day | CUTANEOUS | 0 refills | Status: DC

## 2019-10-24 MED ORDER — TRAZODONE HCL 50 MG PO TABS: 50.0000 mg | ORAL_TABLET | Freq: Every evening | ORAL | Status: DC | PRN

## 2019-10-24 NOTE — Progress Notes (Signed)
Occupational Therapy Weekly Progress Note  Patient Details  Name: [name] MRN: [id number] Date of Birth: 11-03-1940  Beginning of progress report period: September 15, 2019 End of progress report period: September 24, 2019  Pt has not made any progress towards LTG during this past week and LTG downgraded. Pt requires Maxi Move for all transfers.  Pt requires Max A/Tot A for bathing/dressing tasks.  Latest CT revealed severe lumbar stenosis and surgery schedule for 5/10 for decompression. OT therapy has focused on bed mobility (currently max A) and sitting balance (currently max A), in addition to ongoing family education and discharge planning.  Pt/wife have used a Hoyer lift in the past for transfers.  Pt was receving assistance from [facility] every morning and evening prior to this admission.   Patient continues to demonstrate the following deficits: muscle weakness, decreased cardiorespiratoy endurance and decreased oxygen support, impaired timing and sequencing, abnormal tone and decreased coordination and decreased sitting balance, decreased standing balance, decreased postural control and decreased balance strategies and therefore will continue to benefit from skilled OT intervention to enhance overall performance with BADL and Reduce care partner burden.  Patient progressing toward long term goals..  Continue plan of care.  OT Short Term Goals Week 4:  OT Short Term Goal 1 (Week 4): STG=LTG secondary to ELOS OT Short Term Goal 1 - Progress (Week 4): Revised due to lack of progress Week 5:  OT Short Term Goal 1 (Week 5): STG=LTG secondary to ELOS (LTG downgraded due to lack of progress)    [name] 10/24/2019, 6:24 AM

## 2019-10-24 NOTE — Progress Notes (Signed)
Recreational Therapy Session Note  Patient Details  Name: [name] MRN: [id number] Date of Birth: 1940-07-04 Today's Date: 10/24/2019 Time:  905-930 Pain: reports LE spasms, unrated Skilled Therapeutic Interventions/Progress Updates:Pt agreeable to OOB activity and getting up to the power w/c.  Pt reports difficulty breathing but vitals WNLs.  Pt falling asleep once bed is reclined during an attempt to put on his shorts.  Pt was unable to keep his eyes open and engage in therapy session, but later admits he's tired. Notified pt's nurse of patient's fatigue and report of  trouble breathing although vitals WNL.     Therapy/Group: Co-Treatment   Rily Nickey 10/24/2019, 10:19 AM

## 2019-10-24 NOTE — Progress Notes (Signed)
Occupational Therapy Session Note  Patient Details  Name: EDGERRIN CORREIA MRN: [id number] Date of Birth: [birth year]  Today's Date: 10/24/2019 OT Individual Time: 1345-1445 OT Individual Time Calculation (min): 60 min    Short Term Goals: Week 1:  OT Short Term Goal 1 (Week 1): patient will complete bed mobility - rolling and supine to/from sitting with CS OT Short Term Goal 1 - Progress (Week 1): Met OT Short Term Goal 2 (Week 1): patient will complete functional transfers with min A using LRAD OT Short Term Goal 2 - Progress (Week 1): Progressing toward goal OT Short Term Goal 3 (Week 1): patient will complete bathing in shower with min A using assistive devices, shower bench and grab bars OT Short Term Goal 3 - Progress (Week 1): Met OT Short Term Goal 4 (Week 1): patient will complete LB dressing with mod A OT Short Term Goal 4 - Progress (Week 1): Progressing toward goal OT Short Term Goal 5 (Week 1): patient will complete toileting with mod A OT Short Term Goal 5 - Progress (Week 1): Progressing toward goal  Skilled Therapeutic Interventions/Progress Updates:    1:1. Pt received in bed cheery and awake finished with lunch. Pt reporting wishing to get OOB. When rolling to don pants pt with smear of bowel. Pt requesting to sit on bedpan and has small BM. RN present to change bandage on sacrum. Pt completes rolling with MIN A while OT advances pants past hips. Pt transfers to w/c total A +2 via maximove. Pt leans forward and OT pulls shirt overhead and pt doffs off arms. Pt dons shirt mod A with A to pull overhead and down back. Pt and NT educated on pressure relief protocol and NT able to return demo pressure relif in TIS for rest of afternoon. Pt positioned in TIS with NT in room taking vitals  Therapy Documentation Precautions:  Precautions Precautions: Fall Restrictions Weight Bearing Restrictions: No General:   Vital Signs: Therapy Vitals Temp: 98 F (36.7 C) Temp Source:  Oral Pulse Rate: 71 Resp: 16 BP: 131/66 Patient Position (if appropriate): Sitting Oxygen Therapy SpO2: 96 % O2 Device: Nasal Cannula Pain:   ADL: ADL Eating: Set up Where Assessed-Eating: Chair Grooming: Setup Where Assessed-Grooming: Sitting at sink Upper Body Bathing: Minimal assistance Where Assessed-Upper Body Bathing: Sitting at sink Lower Body Bathing: Maximal assistance Where Assessed-Lower Body Bathing: Bed level Upper Body Dressing: Minimal assistance Where Assessed-Upper Body Dressing: Sitting at sink Lower Body Dressing: Maximal assistance Where Assessed-Lower Body Dressing: Bed level Toileting: Maximal assistance Where Assessed-Toileting: Bed level ADL Comments: no urge to void during OT sessions, condom cath in am, brief wet and changed bed level during pm session Vision   Perception    Praxis   Exercises:   Other Treatments:     Therapy/Group: Individual Therapy  Tonny Branch 10/24/2019, 2:43 PM

## 2019-10-24 NOTE — Progress Notes (Signed)
[place] PHYSICAL MEDICINE & REHABILITATION PROGRESS NOTE    Subjective:  Spasms slightly better Doesn't feel better after gets nebs- will check CXR- to see since going ot have surgery- CXR looks stable with persistent L pleural effusion- Pulm saw him 1-2 weeks ago and maximized pulm meds.   CT shows no osteomyelitis of L ankle Pt trying to stay off backside in bed.  ROS:  Pt denies SOB, abd pain, CP, N/V/C/D, and vision changes   Objective:   CT ANKLE LEFT WO CONTRAST  Result Date: 10/23/2019 CLINICAL DATA:  Soft tissue wound of the lateral aspect of the left ankle at the lateral malleolus. EXAM: CT OF THE LEFT ANKLE WITHOUT CONTRAST TECHNIQUE: Multidetector CT imaging of the left ankle was performed according to the standard protocol. Multiplanar CT image reconstructions were also generated. COMPARISON:  None. FINDINGS: Bones/Joint/Cartilage There is no evidence of osteomyelitis, fracture, or other acute abnormality of the bones of the ankle. Tiny marginal osteophytes on the distal tibia. Minimal cystic degenerative changes talus at the posterior facet of the subtalar joint. Small intraosseous ganglion cyst in the calcaneus. No ankle or subtalar joint effusions. Ligaments Suboptimally assessed by CT. Not well enough seen for assessment. Muscles and Tendons The tendons around the ankle demonstrate no significant abnormalities. No discrete muscle abnormality. Soft tissues Subcutaneous edema around the ankle, most prominent over the lateral malleolus where there is a superficial soft tissue ulceration. No visible abscess. IMPRESSION: 1. No evidence of osteomyelitis, fracture, or other acute abnormality of the bones of the ankle. 2. Subcutaneous edema around the ankle, most prominent over the lateral malleolus where there is a superficial soft tissue ulceration. 3. No visible abscess. Electronically Signed   By: Lorriane Shire M.D.   On: 10/23/2019 16:26   DG CHEST PORT 1 VIEW  Result Date:  10/24/2019 CLINICAL DATA:  Wheezing, cough EXAM: PORTABLE CHEST 1 VIEW COMPARISON:  10/14/2019 FINDINGS: Persistent left pleural effusion with associated atelectasis. Improved aeration at the right lung base. Similar cardiomediastinal contours. Surgical clips overlie the neck. Anterior cervical fusion is present. Right rotator cuff repair. IMPRESSION: Persistent left pleural effusion with associated atelectasis. Electronically Signed   By: Macy Mis M.D.   On: 10/24/2019 09:21   No results for input(s): WBC, HGB, HCT, PLT in the last 72 hours. No results for input(s): NA, K, CL, CO2, GLUCOSE, BUN, CREATININE, CALCIUM in the last 72 hours.  Intake/Output Summary (Last 24 hours) at 10/24/2019 1429 Last data filed at 10/24/2019 1300 Gross per 24 hour  Intake 480 ml  Output 1625 ml  Net -1145 ml     Physical Exam: Vital Signs Blood pressure (!) 144/74, pulse 76, temperature 98.2 F (36.8 C), resp. rate 16, height 5\' 7"  (1.702 m), weight 92.5 kg, SpO2 92 %. Constitutional: pt laying more on R side, in air bed, NAD -wearing 2L O2 HENT: conjugate gaze. Cardiovascular: RRR Respiratory:post nebs- sounds like more air movement however  Very coarse, tiny crackles and wheezing still- worse than usually heard  GI: Soft, NT, ND, (+)BS    Skin: saw large DTI on L buttock- waiting to see coccyx which also has DTI per nursing- Psych:calm Musc: No edema in extremities.  No tenderness in extremities. Neurologic:spasms still every 20 seconds or so- even after AM baclofen. Still seen, but not yelling in pain as much.  Motor:  Left lower extremity: 2/5 proximal distal, unchanged Right lower extremity: 1/5 proximal distal- LE exam unchanged.   Assessment/Plan: 1. Functional deficits secondary  to lumbar spinal stenosis with neurogenic claudication which require 3+ hours per day of interdisciplinary therapy in a comprehensive inpatient rehab setting.  Physiatrist is providing close team supervision and 24  hour management of active medical problems listed below.  Physiatrist and rehab team continue to assess barriers to discharge/monitor patient progress toward functional and medical goals  Care Tool:  Bathing    Body parts bathed by patient: Chest, Abdomen, Face   Body parts bathed by helper: Buttocks     Bathing assist Assist Level: Maximal Assistance - Patient 24 - 49%     Upper Body Dressing/Undressing Upper body dressing   What is the patient wearing?: Pull over shirt    Upper body assist Assist Level: Moderate Assistance - Patient 50 - 74%    Lower Body Dressing/Undressing Lower body dressing      What is the patient wearing?: Pants, Underwear/pull up     Lower body assist Assist for lower body dressing: 2 Helpers     Toileting Toileting    Toileting assist Assist for toileting: Dependent - Patient 0%     Transfers Chair/bed transfer  Transfers assist     Chair/bed transfer assist level: Dependent - mechanical lift     Locomotion Ambulation   Ambulation assist   Ambulation activity did not occur: Safety/medical concerns  Assist level: 2 helpers Assistive device: Parallel bars Max distance: 5'   Walk 10 feet activity   Assist  Walk 10 feet activity did not occur: Safety/medical concerns        Walk 50 feet activity   Assist Walk 50 feet with 2 turns activity did not occur: Safety/medical concerns         Walk 150 feet activity   Assist Walk 150 feet activity did not occur: Safety/medical concerns         Walk 10 feet on uneven surface  activity   Assist Walk 10 feet on uneven surfaces activity did not occur: Safety/medical concerns         Wheelchair     Assist Will patient use wheelchair at discharge?: Yes Type of Wheelchair: Manual    Wheelchair assist level: Supervision/Verbal cueing Max wheelchair distance: 74'    Wheelchair 50 feet with 2 turns activity    Assist        Assist Level:  Supervision/Verbal cueing   Wheelchair 150 feet activity     Assist      Assist Level: Supervision/Verbal cueing   Blood pressure (!) 144/74, pulse 76, temperature 98.2 F (36.8 C), resp. rate 16, height 5\' 7"  (1.702 m), weight 92.5 kg, SpO2 92 %.    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  Per neurosurgery, follow-up with outpatient after NCS/EMG.  Neurology consulted  recommended management of spasticity and spasms with outpatient NCS/EMG  5/3- Asked Neuro to come back for recs- but  they recommended was what we discussed, a ITB pump, which cannot be placed inpt. Didn't address NSU note at all.   5/4- will get MRI of cervical spine- since cannot find anything else to focus on-  5/5- spoke with [facility]U and Neurology- decided on doing CT myelogram of entire spine-ordered and to go this AM  5/6- will call Dr Zada Finders about ITB pump and CT myelogram plan-   5/7- going to OR Monday- for lumbar decompression-    Continue CIR 2. Antithrombotics: -DVT/anticoagulation:Lovenox.   4/12- Dopplers (-)- con't lovenox -antiplatelet therapy: Aspirin 81 mg daily 3. Pain Management:Neurontin 100 mg nightly, OxyContin 10 mg every 12 hours,  hydrocodone as needed as well as Imitrex for headaches  baclofen to 15 mg TID- with meals and 20 mg QHS, increase nighttime dose to 30mg  on 5/1 with improvement in resting spasms  Tizanidine 2 mg 3 times daily  5/3- changed breakfast time Baclofen to 0600 since spasms so bad in AM-   5/4- increase baclofen to 20 mg TID and 30 mg QHS  5/5- spasms no better-   5/6- increased baclofen a lot to 30 mg TID and 40 mg QHS- so on 130 mg/day- can't increase much more.  5/7- slightly better  4. Mood:Provide emotional support -antipsychotic agents: N/A 5. Neuropsych: This patientiscapable of making decisions on hisown behalf. 6. Skin/Wound Care:place steristrips over right index finger lac.                          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  Foam dressing left knee  Barrier cream MASD on sacrum  5/3- using Prevalon boots  5/6- will call WOC to help- also on air bed.   5/7- got CT of L ankle ot look for osteomyelitis- was (-) but wound care make recs- changed orders. Air bed and DTI of L/centrla buttcok 7. Fluids/Electrolytes/Nutrition:Routine in and outs  Calcium 8.5 on 4/26-started calcium supplement, labs ordered for tomorrow 8. Chronic restless leg syndrome. Sinemet 10-100 mg twice daily.  See #3 9. 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 OSA with CPAP/2 L chronic oxygen at home. Continue inhalers 11. 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 Follow-up outpatient 12. Diabetes mellitus. Hemoglobin A1c 5.8. SSI. Changed to [facility] for CBG and SSI  HbA1c 5.8- 13. Neurogenic bowel and bladder. Check PVR  Incontinent BMs 14. Hypothyroidism.  Continue  Synthroid 15. Chronic diastolic congestive heart failure. Monitor for any signs of fluid overload. Check regular weights   Filed Weights   10/19/19 0513 10/21/19 0500 10/22/19 0506  Weight: 95.8 kg 98.7 kg 92.5 kg   5/4- up to 98.7 kg- up 3 kg in a 2 days- will monitor  5/6- weight down to 92.5 kg- down a lot? 16. Hyperlipidemia. Zocor 17. Code status: Changed to DNR as per patient's wishes. 18. Leukocytosis - pneumonia- Resolved 19. Wheezing  Resolved on abx, scheduled umeclidinium  4/28- pulmonary saw pt- maximized meds  5/4- improved this AM- feeling SOB when has spasms- thinks it affects diaphragm- can see that 20 Spasticity  vs tone in hamstrings, chronically WC bound may be contracted  See #3 21. Neurogenic bladder  4/28-started Flomax 0.4 mg daily and place foley per wishes of pt.   5/3- will increase flomax to 0.8 mg to try and see if will void once foley try to remove.  22.  Labile blood pressure  Monitor for trend   LOS: 28 days A FACE TO FACE EVALUATION WAS PERFORMED  Tanairy Payeur 10/24/2019, 2:29 PM

## 2019-10-24 NOTE — Progress Notes (Signed)
Occupational Therapy Session Note  Patient Details  Name: [name] MRN: [id number] Date of Birth: 02/21/1941  Today's Date: 10/24/2019 OT Individual Time: [id number] OT Individual Time Calculation (min): 70 min    Short Term Goals: Week 5:  OT Short Term Goal 1 (Week 5): STG=LTG secondary to ELOS (LTG downgraded due to lack of progress)  Skilled Therapeutic Interventions/Progress Updates:    Pt asleep in bed upon arrival with wife present.  Pt easily aroused.  Pt initially lethargic and had difficulty keeping eyes open.  After consistent verbal engagement, pt able to appropriately interact with therapist and wife.  Continued ongoing education regarding DME and power w/c for future use. Pt scheduled for surgery Monday, 5/10. Pt had not eaten his breakfast and declined since it was so close to lunch.  Pt did accept offer of vanilla ice cream and coffee.  Pt required max A for all bed mobility tasks for repositioning. OT intervention with focus on DME/mobility needs, discharge planning, community accessibility options, and bed mobility. Pt remained in bed with all needs within reach.   Therapy Documentation Precautions:  Precautions Precautions: Fall Restrictions Weight Bearing Restrictions: No  Pain: Pain Assessment Pain Scale: 0-10 Pain Score: 8  Pain Type: Acute pain Pain Location: Leg Pain Orientation: Right;Left Pain Intervention(s): Medication (See eMAR); repositioned    Therapy/Group: Individual Therapy  [name] 10/24/2019, 12:00 PM

## 2019-10-24 NOTE — Progress Notes (Signed)
Recreational Therapy Discharge Summary Patient Details  Name: [name] MRN: [id number] Date of Birth: 10-Aug-1940 Today's Date: 10/24/2019  Long term goals set: 1  Long term goals met: 0  Comments on progress toward goals: Pts progress has due to medical changes, procedures and alertness during LOS.  Pt is now scheduled for surgery on 10/27/19.  TR sessions focused on leisure discussion, activity analysis with potential modifications, sitting balance, w/c mobility, discharge planning & emotional support.  Goal not met.   Reasons for discharge: going to surgery 5/10.  Patient/family agrees with progress made and goals achieved: Yes  Smitty Ackerley 10/24/2019, 12:03 PM

## 2019-10-24 NOTE — Progress Notes (Signed)
Physical Therapy Session Note  Patient Details  Name: [name] MRN: [id number] Date of Birth: 07-04-1940  Today's Date: 10/24/2019 PT Individual Time: 0900-0930 PT Individual Time Calculation (min): 30 min   Short Term Goals: Week 4:  PT Short Term Goal 1 (Week 4): Pt will complete bed mobility with assist x 1 PT Short Term Goal 2 (Week 4): Pt will complete least restrictive transfer with assist x 1 PT Short Term Goal 3 (Week 4): Pt will perform w/c mobility x 100 ft at Supervision level  Skilled Therapeutic Interventions/Progress Updates:    Pt received seated in bed, agreeable to PT session. Pt reports pain in BLE from spasms, not rated and declines intervention. Pt agreeable to get up to power wheelchair. Reviewed benefits of power wheelchair mobility vs other types of wheelchair and how pt can benefit from use of one for positioning, pressure relief, increased independence with mobility, etc. Pt on 2L O2 at rest, SpO2 at 97%. Pt does report increased trouble breathing this AM. Attempted to assist pt with donning shorts but he falls asleep once bed placed in supine position. Pt unable to keep his eyes open and engage in therapy session, does admit to feeling fatigued. Deferred further treatment this date due to fatigue level. RN notified of patient's fatigue and reports of trouble breathing, vitals WNL. Pt missed 30 min of scheduled therapy session due to fatigue.  Therapy Documentation Precautions:  Precautions Precautions: Fall Restrictions Weight Bearing Restrictions: No General: PT Amount of Missed Time (min): 30 Minutes PT Missed Treatment Reason: Patient fatigue    Therapy/Group: Individual Therapy   Excell Seltzer, PT, DPT  10/24/2019, 9:52 AM

## 2019-10-24 NOTE — Discharge Summary (Signed)
Physician Discharge Summary  Patient ID: [name] MRN: [id number] DOB/AGE: 11-14-1940 79 y.o.  Admit date: 09/26/2019 Discharge date: 10/28/2019  Discharge Diagnoses:  Principal Problem:   Neurogenic claudication due to lumbar spinal stenosis Active Problems:   Pressure ulcer   Left hemiparesis (HCC)   Cervical myelopathy (HCC)   Hypoxia   Supplemental oxygen dependent   Neurogenic bladder   Chronic diastolic congestive heart failure (HCC)   Neurogenic bowel   Hypocalcemia   Spinal stenosis of lumbar region   Labile blood pressure   Spasticity   Muscle spasm Left buttock and left lateral malleolus unstageable pressure injury  Discharged Condition: Guarded  Significant Diagnostic Studies: CT CERVICAL SPINE WO CONTRAST  Result Date: 10/22/2019 CLINICAL DATA:  Worsening spasticity. Spinal stenosis. Consideration of baclofen pump. FLUOROSCOPY TIME:  1.8 minutes.  95.7 mGy air kerma PROCEDURE: LUMBAR PUNCTURE FOR CERVICAL LUMBAR AND THORACIC MYELOGRAM CERVICAL AND LUMBAR AND THORACIC MYELOGRAM CT CERVICAL MYELOGRAM CT LUMBAR MYELOGRAM CT THORACIC MYELOGRAM Written and oral informed consent was obtained. Consent was obtained by Dr. Monte Fantasia. Time-out was performed. Patient was positioned prone on the fluoroscopy table. Local anesthesia was provided with 1% lidocaine without epinephrine after prepped and draped in the usual sterile fashion. Puncture was performed at L5-S1 using a 3 1/2 inch 22-gauge spinal needle via midline approach. Using a single pass through the dura, the needle was placed within the thecal sac, with return of clear CSF. 10 mL Omnipaque 300 was injected into the thecal sac, with normal opacification of the nerve roots and cauda equina consistent with free flow within the subarachnoid space. Patient cannot stand or or easily positioned on the table due to spasms and joint flexion in the lower extremity. Contrast was advanced into the cervical region in the lateral  decubitus position. I personally performed the lumbar puncture and administered the intrathecal contrast. I also personally supervised acquisition of the myelogram images. TECHNIQUE: Contiguous axial images were obtained through the Cervical, Thoracic, and Lumbar spine after the intrathecal infusion of infusion. Coronal and sagittal reconstructions were obtained of the axial image sets. FINDINGS: CERVICAL AND LUMBAR MYELOGRAM FINDINGS: Myelography was only used for positioning of intrathecal contrast, which flowed freely into the upper cervical spine. L3-4 severe disc space degeneration with vacuum phenomenon and disc space obliteration. There is severe stenosis at this level. CT CERVICAL MYELOGRAM FINDINGS: Alignment: Mild anterolisthesis C6-7 Vertebrae: C3-C6 ACDF with solid arthrodesis. Bridging osteophytes ankylosis the C6-7 and C7-T1 spaces. There is also ankylosis across the right facet and intervertebral space of C2-3. Cord: Suboptimal intrathecal contrast which has layered since myelography. 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 Extraspinal: No evidence of inflammation or mass.  Thyroidectomy. Disc levels: C1-2: Advanced right-sided facet osteoarthritis with joint space narrowing and potential C2 impingement. C2-3: Ankylosis.  No visible impingement C3-4: ACDF. Patent canal. Residual ridging causes moderate left foraminal narrowing. C4-5: ACDF with solid arthrodesis. Patent spinal canal. The foramina appear sufficiently patent C5-6: ACDF. Uncovertebral and facet spurring causes moderate right foraminal narrowing. 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 C6-7: Facet spurring and anterolisthesis. Disc narrowing. Mild bilateral foraminal narrowing. Patent spinal canal C7-T1:Facet spurring and ankylosis. Bridging intervertebral ankylosis. CT THORACIC MYELOGRAM FINDINGS: Alignment:  Exaggerated thoracic kyphosis. Vertebrae: No evidence of fracture or bone lesion. Cord: Suboptimal intrathecal contrast in the upper thoracic levels. No cord compression. Facet spurring in the lower thoracic levels mildly encroaches on the posterolateral cord.  Extra-spinal: Left lower lobe and multi segment left upper lobe collapse with large pleural effusion. There is also right-sided atelectasis and volume loss. Cardiomegaly. Disc levels: Diffuse degenerative disc narrowing and ventral spurring. Bridging osteophytes are seen at multiple levels, including T6-7, T8-9, and T10-11. Facet spurs encroach on lower thoracic foramina without focal high-grade involvement CT LUMBAR MYELOGRAM FINDINGS: Segmentation: 5 lumbar type vertebrae. Alignment: Mild dextroscoliosis. Trace retrolisthesis at L2-3 and L3-4. Vertebrae: Extensive vertebral body sclerosis attributed to degenerative disease. No evidence of fracture or bone lesion. Extra-spinal: Extensive atherosclerotic plaque on the aorta. Conus: Tip terminates at L1-2.  No detected swelling Disc levels: T12- L1: Spondylosis.  No impingement L1-L2: Disc narrowing and bulging with endplate ridging. Moderate spinal stenosis. L2-L3: Degenerative disc collapse with endplate sclerosis and posterior ridging. High-grade spinal stenosis. Moderate left more than right foraminal narrowing L3-L4: Severe disc degeneration with disc space obliteration and ridging. Bilateral facet spurring and ligamentum flavum thickening. Advanced spinal stenosis. Right more than left foraminal impingement L4-L5: Advanced disc narrowing with right-sided collapse and endplate ridging. Facet osteoarthritis with bulky spurring on the right where there is ankylosis. Left more than right subarticular recess narrowing. Biforaminal impingement. L5-S1:Disc narrowing and ridging. Right facet osteoarthritis. Moderate right foraminal narrowing. IMPRESSION: Lumbar spine: 1. Severe degenerative disease with mild  scoliosis and listhesis. 2. 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 3. L2-3 moderate to advanced spinal stenosis. 4. L1-2 moderate spinal stenosis. 5. Bilateral foraminal narrowings described above. Cervical spine: 1. Resolved cord compression after ACDF. There is solid arthrodesis from C3-C6 and degenerative ankylosis at C2-3, C6-7, and C7-T1. 2. Bilateral foraminal narrowings described above. 3. Large left pleural effusion with collapse of the majority of the left lung. Thoracic spine: 1. Diffuse degenerative disease without cord compression. 2. Multilevel spondylitic ankylosis. Electronically Signed   By: Monte Fantasia M.D.   On: 10/22/2019 09:43   CT THORACIC SPINE WO CONTRAST  Result Date: 10/22/2019 CLINICAL DATA:  Worsening spasticity. Spinal stenosis. Consideration of baclofen pump. FLUOROSCOPY TIME:  1.8 minutes.  95.7 mGy air kerma PROCEDURE: LUMBAR PUNCTURE FOR CERVICAL LUMBAR AND THORACIC MYELOGRAM CERVICAL AND LUMBAR AND THORACIC MYELOGRAM CT CERVICAL MYELOGRAM CT LUMBAR MYELOGRAM CT THORACIC MYELOGRAM Written and oral informed consent was obtained. Consent was obtained by Dr. Monte Fantasia. Time-out was performed. Patient was positioned prone on the fluoroscopy table. Local anesthesia was provided with 1% lidocaine without epinephrine after prepped and draped in the usual sterile fashion. Puncture was performed at L5-S1 using a 3 1/2 inch 22-gauge spinal needle via midline approach. Using a single pass through the dura, the needle was placed within the thecal sac, with return of clear CSF. 10 mL Omnipaque 300 was injected into the thecal sac, with normal opacification of the nerve roots and cauda equina consistent with free flow within the subarachnoid space. Patient cannot stand or or easily positioned on the table due to spasms and joint flexion in the lower extremity. Contrast was advanced into the cervical region in the lateral decubitus position. I personally performed the lumbar puncture and  administered the intrathecal contrast. I also personally supervised acquisition of the myelogram images. TECHNIQUE: Contiguous axial images were obtained through the Cervical, Thoracic, and Lumbar spine after the intrathecal infusion of infusion. Coronal and sagittal reconstructions were obtained of the axial image sets. FINDINGS: CERVICAL AND LUMBAR MYELOGRAM FINDINGS: Myelography was only used for positioning of intrathecal contrast, which flowed freely into the upper cervical spine. L3-4 severe disc space degeneration with vacuum phenomenon and disc space obliteration. There  is severe stenosis at this level. CT CERVICAL MYELOGRAM FINDINGS: Alignment: Mild anterolisthesis C6-7 Vertebrae: C3-C6 ACDF with solid arthrodesis. Bridging osteophytes ankylosis the C6-7 and C7-T1 spaces. There is also ankylosis across the right facet and intervertebral space of C2-3. Cord: Suboptimal intrathecal contrast which has layered since myelography. 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 Extraspinal: No evidence of inflammation or mass.  Thyroidectomy. Disc levels: C1-2: Advanced right-sided facet osteoarthritis with joint space narrowing and potential C2 impingement. C2-3: Ankylosis.  No visible impingement C3-4: ACDF. Patent canal. Residual ridging causes moderate left foraminal narrowing. C4-5: ACDF with solid arthrodesis. Patent spinal canal. The foramina appear sufficiently patent C5-6: ACDF. Uncovertebral and facet spurring causes moderate right foraminal narrowing. 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 C6-7: Facet spurring and anterolisthesis. Disc narrowing. Mild bilateral foraminal narrowing. Patent spinal canal C7-T1:Facet spurring and ankylosis. Bridging intervertebral ankylosis. CT THORACIC MYELOGRAM FINDINGS: Alignment: Exaggerated thoracic kyphosis. Vertebrae: No evidence of fracture or bone  lesion. Cord: Suboptimal intrathecal contrast in the upper thoracic levels. No cord compression. Facet spurring in the lower thoracic levels mildly encroaches on the posterolateral cord. Extra-spinal: Left lower lobe and multi segment left upper lobe collapse with large pleural effusion. There is also right-sided atelectasis and volume loss. Cardiomegaly. Disc levels: Diffuse degenerative disc narrowing and ventral spurring. Bridging osteophytes are seen at multiple levels, including T6-7, T8-9, and T10-11. Facet spurs encroach on lower thoracic foramina without focal high-grade involvement CT LUMBAR MYELOGRAM FINDINGS: Segmentation: 5 lumbar type vertebrae. Alignment: Mild dextroscoliosis. Trace retrolisthesis at L2-3 and L3-4. Vertebrae: Extensive vertebral body sclerosis attributed to degenerative disease. No evidence of fracture or bone lesion. Extra-spinal: Extensive atherosclerotic plaque on the aorta. Conus: Tip terminates at L1-2.  No detected swelling Disc levels: T12- L1: Spondylosis.  No impingement L1-L2: Disc narrowing and bulging with endplate ridging. Moderate spinal stenosis. L2-L3: Degenerative disc collapse with endplate sclerosis and posterior ridging. High-grade spinal stenosis. Moderate left more than right foraminal narrowing L3-L4: Severe disc degeneration with disc space obliteration and ridging. Bilateral facet spurring and ligamentum flavum thickening. Advanced spinal stenosis. Right more than left foraminal impingement L4-L5: Advanced disc narrowing with right-sided collapse and endplate ridging. Facet osteoarthritis with bulky spurring on the right where there is ankylosis. Left more than right subarticular recess narrowing. Biforaminal impingement. L5-S1:Disc narrowing and ridging. Right facet osteoarthritis. Moderate right foraminal narrowing. IMPRESSION: Lumbar spine: 1. 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 2. 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 3. L2-3 moderate to  advanced spinal stenosis. 4. L1-2 moderate spinal stenosis. 5. Bilateral foraminal narrowings described above. Cervical spine: 1. Resolved cord compression after ACDF. There is solid arthrodesis from C3-C6 and degenerative ankylosis at C2-3, C6-7, and C7-T1. 2. Bilateral foraminal narrowings described above. 3. Large left pleural effusion with collapse of the majority of the left lung. Thoracic spine: 1. Diffuse degenerative disease without cord compression. 2. Multilevel spondylitic ankylosis. Electronically Signed   By: Monte Fantasia M.D.   On: 10/22/2019 09:43   CT LUMBAR SPINE WO CONTRAST  Result Date: 10/22/2019 CLINICAL DATA:  Worsening spasticity. Spinal stenosis. Consideration of baclofen pump. FLUOROSCOPY TIME:  1.8 minutes.  95.7 mGy air kerma PROCEDURE: LUMBAR PUNCTURE FOR CERVICAL LUMBAR AND THORACIC MYELOGRAM CERVICAL AND LUMBAR AND THORACIC MYELOGRAM CT CERVICAL MYELOGRAM CT LUMBAR MYELOGRAM CT THORACIC MYELOGRAM Written and oral informed consent was obtained. Consent was obtained by Dr. Monte Fantasia. Time-out was performed. Patient was positioned prone on  the fluoroscopy table. Local anesthesia was provided with 1% lidocaine without epinephrine after prepped and draped in the usual sterile fashion. Puncture was performed at L5-S1 using a 3 1/2 inch 22-gauge spinal needle via midline approach. Using a single pass through the dura, the needle was placed within the thecal sac, with return of clear CSF. 10 mL Omnipaque 300 was injected into the thecal sac, with normal opacification of the nerve roots and cauda equina consistent with free flow within the subarachnoid space. Patient cannot stand or or easily positioned on the table due to spasms and joint flexion in the lower extremity. Contrast was advanced into the cervical region in the lateral decubitus position. I personally performed the lumbar puncture and administered the intrathecal contrast. I also personally supervised acquisition of the  myelogram images. TECHNIQUE: Contiguous axial images were obtained through the Cervical, Thoracic, and Lumbar spine after the intrathecal infusion of infusion. Coronal and sagittal reconstructions were obtained of the axial image sets. FINDINGS: CERVICAL AND LUMBAR MYELOGRAM FINDINGS: Myelography was only used for positioning of intrathecal contrast, which flowed freely into the upper cervical spine. L3-4 severe disc space degeneration with vacuum phenomenon and disc space obliteration. There is severe stenosis at this level. CT CERVICAL MYELOGRAM FINDINGS: Alignment: Mild anterolisthesis C6-7 Vertebrae: C3-C6 ACDF with solid arthrodesis. Bridging osteophytes ankylosis the C6-7 and C7-T1 spaces. There is also ankylosis across the right facet and intervertebral space of C2-3. Cord: Suboptimal intrathecal contrast which has layered since myelography. 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 Extraspinal: No evidence of inflammation or mass.  Thyroidectomy. Disc levels: C1-2: Advanced right-sided facet osteoarthritis with joint space narrowing and potential C2 impingement. C2-3: Ankylosis.  No visible impingement C3-4: ACDF. Patent canal. Residual ridging causes moderate left foraminal narrowing. C4-5: ACDF with solid arthrodesis. Patent spinal canal. The foramina appear sufficiently patent C5-6: ACDF. Uncovertebral and facet spurring causes moderate right foraminal narrowing. 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 C6-7: Facet spurring and anterolisthesis. Disc narrowing. Mild bilateral foraminal narrowing. Patent spinal canal C7-T1:Facet spurring and ankylosis. Bridging intervertebral ankylosis. CT THORACIC MYELOGRAM FINDINGS: Alignment: Exaggerated thoracic kyphosis. Vertebrae: No evidence of fracture or bone lesion. Cord: Suboptimal intrathecal contrast in the upper thoracic levels. No cord  compression. Facet spurring in the lower thoracic levels mildly encroaches on the posterolateral cord. Extra-spinal: Left lower lobe and multi segment left upper lobe collapse with large pleural effusion. There is also right-sided atelectasis and volume loss. Cardiomegaly. Disc levels: Diffuse degenerative disc narrowing and ventral spurring. Bridging osteophytes are seen at multiple levels, including T6-7, T8-9, and T10-11. Facet spurs encroach on lower thoracic foramina without focal high-grade involvement CT LUMBAR MYELOGRAM FINDINGS: Segmentation: 5 lumbar type vertebrae. Alignment: Mild dextroscoliosis. Trace retrolisthesis at L2-3 and L3-4. Vertebrae: Extensive vertebral body sclerosis attributed to degenerative disease. No evidence of fracture or bone lesion. Extra-spinal: Extensive atherosclerotic plaque on the aorta. Conus: Tip terminates at L1-2.  No detected swelling Disc levels: T12- L1: Spondylosis.  No impingement L1-L2: Disc narrowing and bulging with endplate ridging. Moderate spinal stenosis. L2-L3: Degenerative disc collapse with endplate sclerosis and posterior ridging. High-grade spinal stenosis. Moderate left more than right foraminal narrowing L3-L4: Severe disc degeneration with disc space obliteration and ridging. Bilateral facet spurring and ligamentum flavum thickening. Advanced spinal stenosis. Right more than left foraminal impingement L4-L5: Advanced disc narrowing with right-sided collapse and endplate ridging. Facet osteoarthritis with bulky spurring on the right where there is  ankylosis. Left more than right subarticular recess narrowing. Biforaminal impingement. L5-S1:Disc narrowing and ridging. Right facet osteoarthritis. Moderate right foraminal narrowing. IMPRESSION: Lumbar spine: 1. 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 2. 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 3. L2-3 moderate to advanced spinal stenosis. 4. L1-2 moderate spinal stenosis. 5. Bilateral foraminal  narrowings described above. Cervical spine: 1. Resolved cord compression after ACDF. There is solid arthrodesis from C3-C6 and degenerative ankylosis at C2-3, C6-7, and C7-T1. 2. Bilateral foraminal narrowings described above. 3. Large left pleural effusion with collapse of the majority of the left lung. Thoracic spine: 1. Diffuse degenerative disease without cord compression. 2. Multilevel spondylitic ankylosis. Electronically Signed   By: Monte Fantasia M.D.   On: 10/22/2019 09:43   MR THORACIC SPINE W WO CONTRAST  Result Date: 10/16/2019 CLINICAL DATA:  Acute neuro deficit. Spasticity in left leg weakness. EXAM: MRI THORACIC AND LUMBAR SPINE WITHOUT AND WITH CONTRAST TECHNIQUE: Multiplanar and multiecho pulse sequences of the thoracic and lumbar spine were obtained without and with intravenous contrast. CONTRAST:  9.80mL GADAVIST GADOBUTROL 1 MMOL/ML IV SOLN COMPARISON:  MRI thoracic spine 09/17/2019. MRI lumbar spine 09/18/2019 FINDINGS: MRI THORACIC SPINE FINDINGS Alignment:  Mild retro listhesis T1-2.  Remaining alignment normal. Image quality degraded by mild motion however the study is significantly better quality than the prior study. The patient was scanned under general anesthesia. Vertebrae: Mild chronic compression fracture of T6. No acute fracture or mass. No evidence of spinal infection or discitis. 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 There is moderate spinal stenosis at T9-10. No definite cord lesion. Abnormal signal in the lower thoracic cord on the prior study was felt to be artifact. Paraspinal and other soft tissues: Mild to moderate left effusion. Right lower lobe atelectasis or pneumonia. Disc levels: L1-2: Disc degeneration and spurring with mild spinal stenosis and moderate foraminal stenosis bilaterally T2-3: Negative T3-4: Small central disc protrusion. Bilateral facet hypertrophy and mild foraminal stenosis bilaterally T4-5: Mild disc degeneration with small  central disc protrusion. Bilateral facet hypertrophy. Mild foraminal narrowing bilaterally T5-6: Small central disc protrusion with mild facet degeneration. No significant stenosis T6-7: Mild disc and facet degeneration without stenosis T7-8: Mild disc and facet degeneration. Small central disc protrusion. Negative for stenosis. T8-9: Small central disc protrusion with mild facet degeneration. Negative for spinal or foraminal stenosis. T9-10: Disc degeneration with diffuse endplate spurring and bilateral facet hypertrophy. Mild to moderate spinal stenosis and mild foraminal stenosis bilaterally. T10-11: Mild disc and facet degeneration. Mild foraminal narrowing bilaterally T11-12: Right-sided disc protrusion with cord flattening on the right and mild spinal stenosis. Mild foraminal narrowing bilaterally. Bilateral facet degeneration T12-L1: Mild right-sided disc protrusion with mild right foraminal stenosis. MRI LUMBAR SPINE FINDINGS Segmentation:  Normal Alignment: Mild retrolisthesis L2-3 and L3-4. Mild anterolisthesis L4-5. Vertebrae: Negative for fracture or mass. Discogenic sclerosis and edema in the bone marrow at L3-4. Fatty changes in the bone marrow at L4-5. No evidence of spinal infection. Conus medullaris: Extends to the L1-2 level and appears normal. Paraspinal and other soft tissues: Negative for paraspinous mass adenopathy or fluid collection. Disc levels: L1-2: Broad-based central disc protrusion. Bilateral facet hypertrophy. Mild to moderate spinal stenosis. L2-3: Advanced disc degeneration with marked disc space narrowing and endplate spurring as well as endplate fatty changes on the left. Moderate facet hypertrophy. Mild to moderate spinal stenosis and moderate subarticular stenosis bilaterally. L3-4: Advanced disc degeneration with diffuse endplate spurring and moderate facet hypertrophy. Severe spinal stenosis and severe  subarticular stenosis bilaterally. Severe right foraminal stenosis. L4-5:  Severe disc degeneration with diffuse endplate spurring. Severe facet degeneration. Mild spinal stenosis. Severe subarticular stenosis on the right and moderate subarticular stenosis on the left L5-S1: Disc degeneration with endplate spurring. Right foraminal disc protrusion with displacement of the right L5 nerve root lateral to the foramen, unchanged from the prior study. Marked facet degeneration bilaterally with moderate subarticular stenosis bilaterally. IMPRESSION: 1. 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 No other cord lesion identified in the thoracic spine. Cord signal on the prior study felt to be due to motion artifact. 2. Multilevel degenerative change throughout the thoracic spine with disc and facet degeneration multiple levels. No cord compression. 3. 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 No interval change from prior study. Electronically Signed   By: Franchot Gallo M.D.   On: 10/16/2019 19:38   MR Lumbar Spine W Wo Contrast  Result Date: 10/16/2019 CLINICAL DATA:  Acute neuro deficit. Spasticity in left leg weakness. EXAM: MRI THORACIC AND LUMBAR SPINE WITHOUT AND WITH CONTRAST TECHNIQUE: Multiplanar and multiecho pulse sequences of the thoracic and lumbar spine were obtained without and with intravenous contrast. CONTRAST:  9.72mL GADAVIST GADOBUTROL 1 MMOL/ML IV SOLN COMPARISON:  MRI thoracic spine 09/17/2019. MRI lumbar spine 09/18/2019 FINDINGS: MRI THORACIC SPINE FINDINGS Alignment:  Mild retro listhesis T1-2.  Remaining alignment normal. Image quality degraded by mild motion however the study is significantly better quality than the prior study. The patient was scanned under general anesthesia. Vertebrae: Mild chronic compression fracture of T6. No acute fracture or mass. No evidence of spinal infection or discitis. 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 There is moderate  spinal stenosis at T9-10. No definite cord lesion. Abnormal signal in the lower thoracic cord on the prior study was felt to be artifact. Paraspinal and other soft tissues: Mild to moderate left effusion. Right lower lobe atelectasis or pneumonia. Disc levels: L1-2: Disc degeneration and spurring with mild spinal stenosis and moderate foraminal stenosis bilaterally T2-3: Negative T3-4: Small central disc protrusion. Bilateral facet hypertrophy and mild foraminal stenosis bilaterally T4-5: Mild disc degeneration with small central disc protrusion. Bilateral facet hypertrophy. Mild foraminal narrowing bilaterally T5-6: Small central disc protrusion with mild facet degeneration. No significant stenosis T6-7: Mild disc and facet degeneration without stenosis T7-8: Mild disc and facet degeneration. Small central disc protrusion. Negative for stenosis. T8-9: Small central disc protrusion with mild facet degeneration. Negative for spinal or foraminal stenosis. T9-10: Disc degeneration with diffuse endplate spurring and bilateral facet hypertrophy. Mild to moderate spinal stenosis and mild foraminal stenosis bilaterally. T10-11: Mild disc and facet degeneration. Mild foraminal narrowing bilaterally T11-12: Right-sided disc protrusion with cord flattening on the right and mild spinal stenosis. Mild foraminal narrowing bilaterally. Bilateral facet degeneration T12-L1: Mild right-sided disc protrusion with mild right foraminal stenosis. MRI LUMBAR SPINE FINDINGS Segmentation:  Normal Alignment: Mild retrolisthesis L2-3 and L3-4. Mild anterolisthesis L4-5. Vertebrae: Negative for fracture or mass. Discogenic sclerosis and edema in the bone marrow at L3-4. Fatty changes in the bone marrow at L4-5. No evidence of spinal infection. Conus medullaris: Extends to the L1-2 level and appears normal. Paraspinal and other soft tissues: Negative for paraspinous mass adenopathy or fluid collection. Disc levels: L1-2: Broad-based central  disc protrusion. Bilateral facet hypertrophy. Mild to moderate spinal stenosis. L2-3: Advanced disc degeneration with marked disc space narrowing and endplate spurring as well as endplate  fatty changes on the left. Moderate facet hypertrophy. Mild to moderate spinal stenosis and moderate subarticular stenosis bilaterally. L3-4: Advanced disc degeneration with diffuse endplate spurring and moderate facet hypertrophy. Severe spinal stenosis and severe subarticular stenosis bilaterally. Severe right foraminal stenosis. L4-5: Severe disc degeneration with diffuse endplate spurring. Severe facet degeneration. Mild spinal stenosis. Severe subarticular stenosis on the right and moderate subarticular stenosis on the left L5-S1: Disc degeneration with endplate spurring. Right foraminal disc protrusion with displacement of the right L5 nerve root lateral to the foramen, unchanged from the prior study. Marked facet degeneration bilaterally with moderate subarticular stenosis bilaterally. IMPRESSION: 1. 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 No other cord lesion identified in the thoracic spine. Cord signal on the prior study felt to be due to motion artifact. 2. Multilevel degenerative change throughout the thoracic spine with disc and facet degeneration multiple levels. No cord compression. 3. 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 No interval change from prior study. Electronically Signed   By: Franchot Gallo M.D.   On: 10/16/2019 19:38   CT ANKLE LEFT WO CONTRAST  Result Date: 10/23/2019 CLINICAL DATA:  Soft tissue wound of the lateral aspect of the left ankle at the lateral malleolus. EXAM: CT OF THE LEFT ANKLE WITHOUT CONTRAST TECHNIQUE: Multidetector CT imaging of the left ankle was performed according to the standard protocol. Multiplanar CT image reconstructions were also generated. COMPARISON:  None. FINDINGS:  Bones/Joint/Cartilage There is no evidence of osteomyelitis, fracture, or other acute abnormality of the bones of the ankle. Tiny marginal osteophytes on the distal tibia. Minimal cystic degenerative changes talus at the posterior facet of the subtalar joint. Small intraosseous ganglion cyst in the calcaneus. No ankle or subtalar joint effusions. Ligaments Suboptimally assessed by CT. Not well enough seen for assessment. Muscles and Tendons The tendons around the ankle demonstrate no significant abnormalities. No discrete muscle abnormality. Soft tissues Subcutaneous edema around the ankle, most prominent over the lateral malleolus where there is a superficial soft tissue ulceration. No visible abscess. IMPRESSION: 1. No evidence of osteomyelitis, fracture, or other acute abnormality of the bones of the ankle. 2. Subcutaneous edema around the ankle, most prominent over the lateral malleolus where there is a superficial soft tissue ulceration. 3. No visible abscess. Electronically Signed   By: Lorriane Shire M.D.   On: 10/23/2019 16:26   DG Sniff Test  Result Date: 10/14/2019 CLINICAL DATA:  Evaluate left diaphragm function. Question pneumonia EXAM: CHEST FLUOROSCOPY TECHNIQUE: Real-time fluoroscopic evaluation of the chest was performed. FLUOROSCOPY TIME:  Fluoroscopy Time:  1 minutes 18 seconds Radiation Exposure Index (if provided by the fluoroscopic device): Number of Acquired Spot Images: 0 COMPARISON:  Chest today FINDINGS: Normal motion of the right hemidiaphragm with inspiration and sniffing. Paradoxical elevation left hemidiaphragm during sniffing compatible with paresis. There is left lower lobe consolidation and left effusion which is layering. 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 Layering left effusion and left lower lobe consolidation. Normal movement of right diaphragm. Electronically Signed   By: Franchot Gallo M.D.   On: 10/14/2019 16:59    DG CHEST PORT 1 VIEW  Result Date: 10/24/2019 CLINICAL DATA:  Wheezing, cough EXAM: PORTABLE CHEST 1 VIEW COMPARISON:  10/14/2019 FINDINGS: Persistent left pleural effusion with associated atelectasis. Improved aeration at the right lung base. Similar cardiomediastinal contours. Surgical clips overlie the neck. Anterior cervical fusion is present. Right rotator cuff  repair. IMPRESSION: Persistent left pleural effusion with associated atelectasis. Electronically Signed   By: Macy Mis M.D.   On: 10/24/2019 09:21   DG Chest Port 1 View  Result Date: 10/14/2019 CLINICAL DATA:  Shortness of breath, cough. EXAM: PORTABLE CHEST 1 VIEW COMPARISON:  October 07, 2019. FINDINGS: Stable cardiomediastinal silhouette. No pneumothorax is noted. Minimal right basilar subsegmental atelectasis is noted. Moderate left pleural effusion is noted with associated left basilar atelectasis or infiltrate. Bony thorax is unremarkable. IMPRESSION: Moderate left pleural effusion is noted with associated left basilar atelectasis or infiltrate. Minimal right basilar subsegmental atelectasis is noted. Electronically Signed   By: Marijo Conception M.D.   On: 10/14/2019 12:55   DG CHEST PORT 1 VIEW  Result Date: 10/07/2019 CLINICAL DATA:  Leukocytosis. EXAM: PORTABLE CHEST 1 VIEW COMPARISON:  September 17, 2019. FINDINGS: Stable cardiomegaly. No pneumothorax is noted. Right lung is clear. Large left lower lobe airspace opacity is noted concerning for pneumonia or atelectasis with associated pleural effusion. Bony thorax is unremarkable. IMPRESSION: Large left lower lobe airspace opacity is noted concerning for pneumonia or atelectasis with associated pleural effusion. Electronically Signed   By: Marijo Conception M.D.   On: 10/07/2019 12:51   DG MYELOGRAPHY LUMBAR INJ MULTI REGION  Result Date: 10/22/2019 CLINICAL DATA:  Worsening spasticity. Spinal stenosis. Consideration of baclofen pump. FLUOROSCOPY TIME:  1.8 minutes.  95.7 mGy air  kerma PROCEDURE: LUMBAR PUNCTURE FOR CERVICAL LUMBAR AND THORACIC MYELOGRAM CERVICAL AND LUMBAR AND THORACIC MYELOGRAM CT CERVICAL MYELOGRAM CT LUMBAR MYELOGRAM CT THORACIC MYELOGRAM Written and oral informed consent was obtained. Consent was obtained by Dr. Monte Fantasia. Time-out was performed. Patient was positioned prone on the fluoroscopy table. Local anesthesia was provided with 1% lidocaine without epinephrine after prepped and draped in the usual sterile fashion. Puncture was performed at L5-S1 using a 3 1/2 inch 22-gauge spinal needle via midline approach. Using a single pass through the dura, the needle was placed within the thecal sac, with return of clear CSF. 10 mL Omnipaque 300 was injected into the thecal sac, with normal opacification of the nerve roots and cauda equina consistent with free flow within the subarachnoid space. Patient cannot stand or or easily positioned on the table due to spasms and joint flexion in the lower extremity. Contrast was advanced into the cervical region in the lateral decubitus position. I personally performed the lumbar puncture and administered the intrathecal contrast. I also personally supervised acquisition of the myelogram images. TECHNIQUE: Contiguous axial images were obtained through the Cervical, Thoracic, and Lumbar spine after the intrathecal infusion of infusion. Coronal and sagittal reconstructions were obtained of the axial image sets. FINDINGS: CERVICAL AND LUMBAR MYELOGRAM FINDINGS: Myelography was only used for positioning of intrathecal contrast, which flowed freely into the upper cervical spine. L3-4 severe disc space degeneration with vacuum phenomenon and disc space obliteration. There is severe stenosis at this level. CT CERVICAL MYELOGRAM FINDINGS: Alignment: Mild anterolisthesis C6-7 Vertebrae: C3-C6 ACDF with solid arthrodesis. Bridging osteophytes ankylosis the C6-7 and C7-T1 spaces. There is also ankylosis across the right facet and  intervertebral space of C2-3. Cord: Suboptimal intrathecal contrast which has layered since myelography. 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 Extraspinal: No evidence of inflammation or mass.  Thyroidectomy. Disc levels: C1-2: Advanced right-sided facet osteoarthritis with joint space narrowing and potential C2 impingement. C2-3: Ankylosis.  No visible impingement C3-4: ACDF. Patent canal. Residual ridging causes moderate left foraminal narrowing.  C4-5: ACDF with solid arthrodesis. Patent spinal canal. The foramina appear sufficiently patent C5-6: ACDF. Uncovertebral and facet spurring causes moderate right foraminal narrowing. 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 C6-7: Facet spurring and anterolisthesis. Disc narrowing. Mild bilateral foraminal narrowing. Patent spinal canal C7-T1:Facet spurring and ankylosis. Bridging intervertebral ankylosis. CT THORACIC MYELOGRAM FINDINGS: Alignment: Exaggerated thoracic kyphosis. Vertebrae: No evidence of fracture or bone lesion. Cord: Suboptimal intrathecal contrast in the upper thoracic levels. No cord compression. Facet spurring in the lower thoracic levels mildly encroaches on the posterolateral cord. Extra-spinal: Left lower lobe and multi segment left upper lobe collapse with large pleural effusion. There is also right-sided atelectasis and volume loss. Cardiomegaly. Disc levels: Diffuse degenerative disc narrowing and ventral spurring. Bridging osteophytes are seen at multiple levels, including T6-7, T8-9, and T10-11. Facet spurs encroach on lower thoracic foramina without focal high-grade involvement CT LUMBAR MYELOGRAM FINDINGS: Segmentation: 5 lumbar type vertebrae. Alignment: Mild dextroscoliosis. Trace retrolisthesis at L2-3 and L3-4. Vertebrae: Extensive vertebral body sclerosis attributed to degenerative disease. No evidence of fracture  or bone lesion. Extra-spinal: Extensive atherosclerotic plaque on the aorta. Conus: Tip terminates at L1-2.  No detected swelling Disc levels: T12- L1: Spondylosis.  No impingement L1-L2: Disc narrowing and bulging with endplate ridging. Moderate spinal stenosis. L2-L3: Degenerative disc collapse with endplate sclerosis and posterior ridging. High-grade spinal stenosis. Moderate left more than right foraminal narrowing L3-L4: Severe disc degeneration with disc space obliteration and ridging. Bilateral facet spurring and ligamentum flavum thickening. Advanced spinal stenosis. Right more than left foraminal impingement L4-L5: Advanced disc narrowing with right-sided collapse and endplate ridging. Facet osteoarthritis with bulky spurring on the right where there is ankylosis. Left more than right subarticular recess narrowing. Biforaminal impingement. L5-S1:Disc narrowing and ridging. Right facet osteoarthritis. Moderate right foraminal narrowing. IMPRESSION: Lumbar spine: 1. 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 2. 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 3. L2-3 moderate to advanced spinal stenosis. 4. L1-2 moderate spinal stenosis. 5. Bilateral foraminal narrowings described above. Cervical spine: 1. Resolved cord compression after ACDF. There is solid arthrodesis from C3-C6 and degenerative ankylosis at C2-3, C6-7, and C7-T1. 2. Bilateral foraminal narrowings described above. 3. Large left pleural effusion with collapse of the majority of the left lung. Thoracic spine: 1. Diffuse degenerative disease without cord compression. 2. Multilevel spondylitic ankylosis. Electronically Signed   By: Monte Fantasia M.D.   On: 10/22/2019 09:43   VAS [place] LOWER EXTREMITY VENOUS (DVT)  Result Date: 09/30/2019  Lower Venous DVTStudy Indications: Rehab for spinal stenosis surgery  Comparison Study: No prior study on file Performing Technologist: Sharion Dove RVS  Examination Guidelines: A complete evaluation  includes B-mode imaging, spectral Doppler, color Doppler, and power Doppler as needed of all accessible portions of each vessel. Bilateral testing is considered an integral part of a complete examination. Limited examinations for reoccurring indications may be performed as noted. The reflux portion of the exam is performed with the patient in reverse Trendelenburg.  +---------+---------------+---------+-----------+----------+--------------+ RIGHT    CompressibilityPhasicitySpontaneityPropertiesThrombus Aging +---------+---------------+---------+-----------+----------+--------------+ CFV      Full           Yes      Yes                                 +---------+---------------+---------+-----------+----------+--------------+ SFJ      Full                                                        +---------+---------------+---------+-----------+----------+--------------+  FV Prox  Full                                                        +---------+---------------+---------+-----------+----------+--------------+ FV Mid   Full                                                        +---------+---------------+---------+-----------+----------+--------------+ FV DistalFull                                                        +---------+---------------+---------+-----------+----------+--------------+ PFV      Full                                                        +---------+---------------+---------+-----------+----------+--------------+ POP      Full           Yes      Yes                                 +---------+---------------+---------+-----------+----------+--------------+ PTV      Full                                                        +---------+---------------+---------+-----------+----------+--------------+ PERO     Full                                                         +---------+---------------+---------+-----------+----------+--------------+   +---------+---------------+---------+-----------+----------+--------------+ LEFT     CompressibilityPhasicitySpontaneityPropertiesThrombus Aging +---------+---------------+---------+-----------+----------+--------------+ CFV      Full           Yes      Yes                                 +---------+---------------+---------+-----------+----------+--------------+ SFJ      Full                                                        +---------+---------------+---------+-----------+----------+--------------+ FV Prox  Full                                                        +---------+---------------+---------+-----------+----------+--------------+  FV Mid   Full                                                        +---------+---------------+---------+-----------+----------+--------------+ FV DistalFull                                                        +---------+---------------+---------+-----------+----------+--------------+ PFV      Full                                                        +---------+---------------+---------+-----------+----------+--------------+ POP      Full           Yes      Yes                                 +---------+---------------+---------+-----------+----------+--------------+ PTV      Full                                                        +---------+---------------+---------+-----------+----------+--------------+ PERO     Full                                                        +---------+---------------+---------+-----------+----------+--------------+     Summary: BILATERAL: - No evidence of deep vein thrombosis seen in the lower extremities, bilaterally.   *See table(s) above for measurements and observations. Electronically signed by Deitra Mayo MD on 09/30/2019 at 3:29:11 PM.    Final     Labs:  Basic  Metabolic Panel: Recent Labs  Lab 10/20/19 0531  NA 143  K 3.8  CL 101  CO2 35*  GLUCOSE 98  BUN 11  CREATININE 0.69  CALCIUM 8.8*    CBC: Recent Labs  Lab 10/20/19 0531  WBC 8.4  NEUTROABS 5.5  HGB 11.8*  HCT 36.9*  MCV 89.8  PLT 457*    CBG: Recent Labs  Lab 10/26/19 0217  GLUCAP 108*   Family history.  Mother with COPD hyperlipidemia hypertension.  Father with diabetes mellitus and kidney disease as well as hypertension and hyperlipidemia.  Denies any esophageal cancer rectal cancer or colon cancer  Brief HPI:   [name] is a 79 y.o. 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 Brett Fairy of neurology service as well as neurosurgery, myoclonic jerking, bowel bladder incontinence.  Per chart review lives with spouse 1 level home ramped entrance.  Needed assistance for dressing as well as transfers.  He has a PCA who does provide assistance.  Patient was able to ambulate short household distances with a walker.  Presented 09/17/2019 generalized fatigue over the last couple of days decrease in functional mobility.  Cranial CT scan negative for acute changes.  Admission chemistries BUN 43 creatinine 1.64 WBC 13,000 troponin negative urinalysis negative nitrite with urine culture 30,000 diphtheroids blood cultures no growth to date.  MRI thoracic lumbar cervical spine showed chronic changes with patchy cord signal abnormality at the C3-4 level consistent with chronic myelomalacia.  Multilevel degenerative disc disease of the lower thoracic spine without  high-grade spinal stenosis as well as lumbar spondylosis and congenitally short pedicles causing prominent impingement at L2-3 and 3-4 with moderate impingement L1-2 and 4-5 as well as T12-L1 and L5-S1.  Impingement of L1-2 and 2-3 minimally worsened from prior exam 07/24/2017.  Neurosurgery initially consulted in regards to MRI findings patient not felt to be a surgical candidate at this time advised IV Solu-Medrol transition to prolonged prednisone with taper as needed.  Echocardiogram completed during work-up showed ejection fraction of 60% no wall motion abnormalities.  Chronic pain control ongoing with the use of scheduled baclofen titrated as needed as well as nightly Neurontin and OxyContin.  Therapy evaluations completed and patient was admitted for a comprehensive rehab program   Hospital Course: [name] was admitted to rehab 09/26/2019 for inpatient therapies to consist of PT, ST and OT at least three hours five days a week. Past admission physiatrist, therapy team and rehab RN have worked together to provide customized collaborative inpatient rehab.  Pertaining to patient incomplete paraplegia spasticity lower extremities due to nontraumatic compression he also has chronic left hemiparesis secondary to myelopathy of C-spine radiculopathy status post ACDF 09/03/2017 in addition ongoing low back issues.  Close following for increasing spasticity weakness MRI 4/29 showed spinal stenosis T9-10 with small syrinx multilevel DDD disc protrusion cord flattening at T11/12 and L3-5 severe spinal stenosis.  Neurosurgery was consulted in regards to these findings considerations were being made for possible baclofen pump.  Neurology was consulted recommendations initially for management of spasticity perhaps outpatient EMG.  Cervical and lumbar/thoracic myelogram completed showing severe degeneration with mild scoliosis and listhesis as well as L3-4 severe spinal stenosis L2-3 moderate to advanced spinal stenosis  L1-2 moderate spinal stenosis.  Resolved cord compression after ACDF.  There was solid arthrodesis from C3-6 and degenerative ankylosis.  Bilateral foraminal narrowing noted.  Thoracic spine diffuse degenerative disease without cord compression.  Neurosurgery discussed at length with family in regards to weakness spasticity and myelogram findings and plan was for L3-4 decompression 10/27/2019 per Dr. Venetia Constable.  During patient's rehabilitation stay he continued on Lovenox for DVT prophylaxis venous Doppler studies negative.  Neurontin 100 mg nightly as well as scheduled OxyContin with hydrocodone for breakthrough pain his baclofen was titrated as well is maintained on tizanidine.  Baclofen increased to 30 mg 3 times daily and 40 mg nightly 10/23/2019.  He did have a history of chronic restless leg syndrome Sinemet as advised as well as history of myoclonic jerking chronic Keppra twice daily.  OSA with CPAP chronic oxygen monitoring of oxygen saturations.  History of thoracic ascending aortic aneurysm follow-up as outpatient.  Blood sugars overall controlled hemoglobin A1c of 5.8.  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  Hormone supplement ongoing for hypothyroidism.  He did have a history of chronic diastolic congestive heart failure monitoring for any signs of fluid overload.  Wound care nurse was consulted 10/23/2019 for left ankle ulcer as well as left buttock with wound care as directed.  Left ankle films were completed showing no evidence of osteomyelitis fracture or other acute abnormality.  No visible abscess.   Blood pressures were monitored on TID basis and controlled   He/ has made slow gains during rehab stay and is attending therapies  He/ will continue to receive follow up therapies   after discharge  Rehab course: During patient's stay in rehab weekly team conferences were held to monitor  patient's progress, set goals and discuss barriers to discharge. At admission, patient required moderate assist lateral scoot transfers mod assist supine to sit moderate assist side-lying to sitting mod assist supine to sit.  Moderate assist upper body bathing mod assist lower body bathing minimal assist upper body dressing max is lower body dressing  He/  has had improvement in activity tolerance, balance, postural control as well as ability to compensate for deficits. He/ has had improvement in functional use RUE/LUE  and RLE/LLE as well as improvement in awareness.  Patient with slow progressive gains due to increasing weakness lower extremities and spasticity.  Lateral and anterior leans in a wheelchair with minimal assist for placement needing some encouragement at times participate.  Seated balance edge of bed with close standby assist.  Manual wheelchair propulsion bilateral upper extremity supervision.  ADL focus on bed mobility lower body dressing task at bed level needing ongoing assistance.  Patient was planned for surgical intervention 10/27/2019       Disposition: Discharged to acute care services for surgical intervention L3-4 decompression    Diet: Regular  Special Instructions: As per neurosurgery  Medications at discharge 1.  Tylenol as needed 2.  Zyloprim 300 mg p.o. daily 3.  Aspirin 81 mg p.o. daily 4.  Baclofen 30 mg 3 times daily and 40 mg nightly 5.  Calcium citrate 200 mg p.o. daily 6.  Sinemet 10-100 mg 1 p.o. twice daily 7.  Colchicine 0.6 mg p.o. daily 8.  Santyl ointment applied to left buttock sacral wound and left lateral ankle wound in a nickel thick layer.  Cover with saline moistened gauze then I size appropriate foam dressing change daily 9.  Lovenox 40 mg subcutaneous daily 10.  Breo Ellipta 1 puff daily 11.  Neurontin 100 mg p.o. nightly 12.  Mucinex 1200 mg p.o. twice daily 13.  Hydrocodone 1 tablet p.o. 3 times daily as needed 14.  DuoNeb nebulizer  every 6 hours as needed 15.  Keppra 250 mg p.o. twice daily 16.Xalatan ophthalmic solution 1 drop both eyes nightly 17.  Keppra 250 mg p.o. twice daily 18.  Synthroid 125 mcg p.o. daily 19.  Multivitamin daily 20.  Nitroglycerin as needed 21 OxyContin 10 mg every 12 hours 22.  Protonix 40 mg p.o. daily 23.  MiraLAX twice daily hold for loose stools 24.  Zocor 40 mg p.o. daily 25.  Imitrex 50 mg p.o. every 2 hours as needed migraine headache 26.  Flomax 0.8 mg p.o. after supper 27.  Zanaflex 2 mg p.o. 3 times daily 28.  Trazodone 50 mg nightly as needed sleep   30-35 minutes were used in completion of discharge summary  Discharge  Instructions    Ambulatory referral to Physical Medicine Rehab   Complete by: As directed    Moderate complexity follow-up 1 to 2 weeks  Radiculopathy/myelopathy      Follow-up Information    Lovorn, Jinny Blossom, MD Follow up.   Specialty: Physical Medicine and Rehabilitation Why: Office to call for appointment Contact information: [id number] [address] [place] [address] [phone]        Dohmeier, Asencion Partridge, MD Follow up.   Specialty: Neurology Why: Call for appointment Contact information: [address] [place] [address] [phone]        Martyn Ehrich, NP Follow up on 11/04/2019.   Specialty: Pulmonary Disease Why: 12n Contact information: [place] [address] [phone]           Signed: Lavon Paganini [place] 10/27/2019, 5:07 AM

## 2019-10-24 NOTE — Progress Notes (Signed)
This RN received report for this shift and rounded on patients at around 1940. RN introduced himself to patient in [place], and asked if patient had any needs at the moment. Pt said he needed something for pain/spasms. RN assessed his pain, and told patient 'I will be back with your pain medication as soon as I can.' RN arrived back in patient's room at 2005 to administer his pain medication, however patient was irritated that RN made him wait. Patient claims that he was told he would obtain pain medication in about 15 minutes, but it has been over an hour since he called, and he has called multiple times already. RN was never aware, nor informed that the patinet called multiple times. It was explained to patient that the first time this RN 'laid eyes' on patient was at 60. Also, RN explained that he has other patients to attend too and was waiting to pull patient's medications from pyxis. Patient requested to speak with Charge Nurse to relay his concerns.

## 2019-10-25 MED ORDER — TIZANIDINE HCL 2 MG PO TABS
4.0000 mg | ORAL_TABLET | Freq: Three times a day (TID) | ORAL | Status: DC
  Administered 2019-10-25 – 2019-10-26 (×2): 4 mg via ORAL
  Filled 2019-10-25 (×2): qty 2

## 2019-10-25 NOTE — Progress Notes (Signed)
[place] PHYSICAL MEDICINE & REHABILITATION PROGRESS NOTE    Pt just coming off CPAP. Complains of legs hurting, severe spasms. Very irritable  ROS: Limited due to cognitive/behavioral    Objective:   CT ANKLE LEFT WO CONTRAST  Result Date: 10/23/2019 CLINICAL DATA:  Soft tissue wound of the lateral aspect of the left ankle at the lateral malleolus. EXAM: CT OF THE LEFT ANKLE WITHOUT CONTRAST TECHNIQUE: Multidetector CT imaging of the left ankle was performed according to the standard protocol. Multiplanar CT image reconstructions were also generated. COMPARISON:  None. FINDINGS: Bones/Joint/Cartilage There is no evidence of osteomyelitis, fracture, or other acute abnormality of the bones of the ankle. Tiny marginal osteophytes on the distal tibia. Minimal cystic degenerative changes talus at the posterior facet of the subtalar joint. Small intraosseous ganglion cyst in the calcaneus. No ankle or subtalar joint effusions. Ligaments Suboptimally assessed by CT. Not well enough seen for assessment. Muscles and Tendons The tendons around the ankle demonstrate no significant abnormalities. No discrete muscle abnormality. Soft tissues Subcutaneous edema around the ankle, most prominent over the lateral malleolus where there is a superficial soft tissue ulceration. No visible abscess. IMPRESSION: 1. No evidence of osteomyelitis, fracture, or other acute abnormality of the bones of the ankle. 2. Subcutaneous edema around the ankle, most prominent over the lateral malleolus where there is a superficial soft tissue ulceration. 3. No visible abscess. Electronically Signed   By: Lorriane Shire M.D.   On: 10/23/2019 16:26   DG CHEST PORT 1 VIEW  Result Date: 10/24/2019 CLINICAL DATA:  Wheezing, cough EXAM: PORTABLE CHEST 1 VIEW COMPARISON:  10/14/2019 FINDINGS: Persistent left pleural effusion with associated atelectasis. Improved aeration at the right lung base. Similar cardiomediastinal contours. Surgical  clips overlie the neck. Anterior cervical fusion is present. Right rotator cuff repair. IMPRESSION: Persistent left pleural effusion with associated atelectasis. Electronically Signed   By: Macy Mis M.D.   On: 10/24/2019 09:21   No results for input(s): WBC, HGB, HCT, PLT in the last 72 hours. No results for input(s): NA, K, CL, CO2, GLUCOSE, BUN, CREATININE, CALCIUM in the last 72 hours.  Intake/Output Summary (Last 24 hours) at 10/25/2019 0855 Last data filed at 10/25/2019 [id number] Gross per 24 hour  Intake 493 ml  Output 1275 ml  Net -782 ml     Physical Exam: Vital Signs Blood pressure 135/65, pulse 73, temperature 98 F (36.7 C), temperature source Oral, resp. rate 19, height 5\' 7"  (1.702 m), weight 92.5 kg, SpO2 90 %. Constitutional: No distress . Vital signs reviewed. Frail appearing HEENT: EOMI, oral membranes moist Neck: supple Cardiovascular: RRR without murmur. No JVD    Respiratory/Chest: CTA Bilaterally without wheezes or rales. Normal effort    GI/Abdomen: BS +, non-tender, non-distended Ext: no clubbing, cyanosis, or edema Psych: very irritable Skin: buttock and coccyx--wound with slough. Left ankle not visualized  Psych:calm Musc: No edema in extremities.  No tenderness in extremities. Neurologic:significant tone 3/4 bilateral HAD's, hamstrings---very tender with any attempted ROM  Motor:  Left lower extremity: 2/5 proximal distal, unchanged Right lower extremity: 1/5 proximal distal- LE exam unchanged.   Assessment/Plan: 1. Functional deficits secondary to lumbar spinal stenosis with neurogenic claudication which require 3+ hours per day of interdisciplinary therapy in a comprehensive inpatient rehab setting.  Physiatrist is providing close team supervision and 24 hour management of active medical problems listed below.  Physiatrist and rehab team continue to assess barriers to discharge/monitor patient progress toward functional and medical goals  Care  Tool:  Bathing    Body parts bathed by patient: Chest, Abdomen, Face   Body parts bathed by helper: Buttocks     Bathing assist Assist Level: Maximal Assistance - Patient 24 - 49%     Upper Body Dressing/Undressing Upper body dressing   What is the patient wearing?: Pull over shirt    Upper body assist Assist Level: Moderate Assistance - Patient 50 - 74%    Lower Body Dressing/Undressing Lower body dressing      What is the patient wearing?: Pants, Underwear/pull up     Lower body assist Assist for lower body dressing: 2 Helpers     Toileting Toileting    Toileting assist Assist for toileting: Dependent - Patient 0%     Transfers Chair/bed transfer  Transfers assist     Chair/bed transfer assist level: Dependent - mechanical lift     Locomotion Ambulation   Ambulation assist   Ambulation activity did not occur: Safety/medical concerns  Assist level: 2 helpers Assistive device: Parallel bars Max distance: 5'   Walk 10 feet activity   Assist  Walk 10 feet activity did not occur: Safety/medical concerns        Walk 50 feet activity   Assist Walk 50 feet with 2 turns activity did not occur: Safety/medical concerns         Walk 150 feet activity   Assist Walk 150 feet activity did not occur: Safety/medical concerns         Walk 10 feet on uneven surface  activity   Assist Walk 10 feet on uneven surfaces activity did not occur: Safety/medical concerns         Wheelchair     Assist Will patient use wheelchair at discharge?: Yes Type of Wheelchair: Manual    Wheelchair assist level: Supervision/Verbal cueing Max wheelchair distance: 7'    Wheelchair 50 feet with 2 turns activity    Assist        Assist Level: Supervision/Verbal cueing   Wheelchair 150 feet activity     Assist      Assist Level: Supervision/Verbal cueing   Blood pressure 135/65, pulse 73, temperature 98 F (36.7 C), temperature  source Oral, resp. rate 19, height 5\' 7"  (1.702 m), weight 92.5 kg, SpO2 90 %.    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  Per neurosurgery, follow-up with outpatient after NCS/EMG.  Neurology consulted recommended management of spasticity and spasms with outpatient NCS/EMG  5/3- Asked Neuro to come back for recs- but  they recommended was what we discussed, a ITB pump, which cannot be placed inpt. Didn't address NSU note at all.   5/4- will get MRI of cervical spine- since cannot find anything else to focus on-  5/5- spoke with NSU and Neurology- decided on doing CT myelogram of entire spine-ordered and to go this AM  5/6- will call Dr Zada Finders about ITB pump and CT myelogram plan-   5/8- going to OR Monday- for   L3-4 decompression    Continue CIR as able 2. Antithrombotics: -DVT/anticoagulation:Lovenox.   4/12- Dopplers (-)- con't lovenox -antiplatelet therapy: Aspirin 81 mg daily 3. Pain Management:Neurontin 100 mg nightly, OxyContin 10 mg every 12 hours, hydrocodone as needed as well as Imitrex for headaches  baclofen to 15 mg TID- with meals and 20 mg QHS, increase nighttime dose to 30mg  on 5/1 with improvement in resting  spasms  Tizanidine 2 mg 3 times daily  5/3- changed breakfast time Baclofen to 0600 since spasms so bad in AM-   5/4- increase baclofen to 20 mg TID and 30 mg QHS  5/5- spasms no better-   5/6- increased baclofen a lot to 30 mg TID and 40 mg QHS- so on 130 mg/day- can't increase much more.  5/8- tone still severe. Increase tizanidine to 4mg  TID  4. Mood:Provide emotional support -antipsychotic agents: N/A 5. Neuropsych: This patientiscapable of making decisions on hisown behalf. 6. Skin/Wound Care:place steristrips over right index finger lac.                          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  Foam dressing left knee  Barrier cream MASD on sacrum  5/3- using Prevalon boots  5/6- will call WOC to help- also on air bed.   5/7- got CT of L ankle ot look for osteomyelitis- was (-) but wound care make recs- changed orders. Air bed and DTI of L/centrla buttcok  5/8 continue above plan 7. Fluids/Electrolytes/Nutrition:Routine in and outs  Calcium 8.5 on 4/26-started calcium supplement, labs ordered for tomorrow 8. Chronic restless leg syndrome. Sinemet 10-100 mg twice daily.  See #3 9. 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 OSA with CPAP/2 L chronic oxygen at home. Continue inhalers 11. 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 Follow-up outpatient 12. Diabetes mellitus. Hemoglobin A1c 5.8. SSI. Changed to [facility] HS for CBG and SSI  HbA1c 5.8- 13. Neurogenic bowel and bladder. Check PVR  Incontinent BMs 14. Hypothyroidism.  Continue Synthroid 15. Chronic diastolic congestive heart failure. Monitor for any signs of fluid overload. Check regular  weights   Filed Weights   10/19/19 0513 10/21/19 0500 10/22/19 0506  Weight: 95.8 kg 98.7 kg 92.5 kg   5/4- up to 98.7 kg- up 3 kg in a 2 days- will monitor  5/6- weight down to 92.5 kg- down a lot? 16. Hyperlipidemia. Zocor 17. Code status: Changed to DNR as per patient's wishes. 18. Leukocytosis - pneumonia- Resolved 19. Wheezing  Resolved on abx, scheduled umeclidinium  4/28- pulmonary saw pt- maximized meds  5/4- improved this AM- feeling SOB when has spasms- thinks it affects diaphragm- can see that 20 Spasticity vs tone in hamstrings, chronically WC bound may be contracted  See #3 21. Neurogenic bladder  4/28-started Flomax 0.4 mg daily and place foley per wishes of pt.   5/3- will increase flomax to 0.8 mg to try and see if  will void once foley try to remove.  22.  Labile blood pressure  Monitor for trend   LOS: 29 days A FACE TO FACE EVALUATION WAS PERFORMED  [name] 10/25/2019, 8:55 AM

## 2019-10-26 ENCOUNTER — Inpatient Hospital Stay (HOSPITAL_COMMUNITY): Payer: Medicare Other | Admitting: *Deleted

## 2019-10-26 LAB — SURGICAL PCR SCREEN
MRSA, PCR: POSITIVE — AB
Staphylococcus aureus: POSITIVE — AB

## 2019-10-26 LAB — GLUCOSE, CAPILLARY: Glucose-Capillary: 108 mg/dL — ABNORMAL HIGH (ref 70–99)

## 2019-10-26 MED ORDER — TIZANIDINE HCL 2 MG PO TABS
2.0000 mg | ORAL_TABLET | Freq: Three times a day (TID) | ORAL | Status: DC
  Administered 2019-10-26 – 2019-10-28 (×6): 2 mg via ORAL
  Filled 2019-10-26 (×6): qty 1

## 2019-10-26 MED ORDER — MUPIROCIN 2 % EX OINT
1.0000 "application " | TOPICAL_OINTMENT | Freq: Two times a day (BID) | CUTANEOUS | Status: DC
  Administered 2019-10-26 – 2019-10-28 (×5): 1 via NASAL
  Filled 2019-10-26: qty 22

## 2019-10-26 NOTE — Progress Notes (Signed)
[place] PHYSICAL MEDICINE & REHABILITATION PROGRESS NOTE    Seemed to sleep better last night. Still c/o spasms/le pain.   ROS: Limited due to  behavioral    Objective:   DG CHEST PORT 1 VIEW  Result Date: 10/24/2019 CLINICAL DATA:  Wheezing, cough EXAM: PORTABLE CHEST 1 VIEW COMPARISON:  10/14/2019 FINDINGS: Persistent left pleural effusion with associated atelectasis. Improved aeration at the right lung base. Similar cardiomediastinal contours. Surgical clips overlie the neck. Anterior cervical fusion is present. Right rotator cuff repair. IMPRESSION: Persistent left pleural effusion with associated atelectasis. Electronically Signed   By: Macy Mis M.D.   On: 10/24/2019 09:21   No results for input(s): WBC, HGB, HCT, PLT in the last 72 hours. No results for input(s): NA, K, CL, CO2, GLUCOSE, BUN, CREATININE, CALCIUM in the last 72 hours.  Intake/Output Summary (Last 24 hours) at 10/26/2019 0821 Last data filed at 10/26/2019 0523 Gross per 24 hour  Intake 336 ml  Output 2475 ml  Net -2139 ml     Physical Exam: Vital Signs Blood pressure 116/61, pulse 84, temperature 98.9 F (37.2 C), temperature source Oral, resp. rate 17, height 5\' 7"  (1.702 m), weight 95.3 kg, SpO2 (!) 88 %. Constitutional: No distress . Vital signs reviewed. HEENT: EOMI, oral membranes moist Neck: supple Cardiovascular: RRR without murmur. No JVD    Respiratory/Chest: CTA Bilaterally without wheezes or rales. Normal effort    GI/Abdomen: BS +, non-tender, non-distended Ext: no clubbing, cyanosis, or edema Psych: more calm today.  Skin: buttock and coccyx--wound with slough. Left ankle not visualized  Psych:calm Musc: No edema in extremities.  No tenderness in extremities. Neurologic:significant tone 3/4 bilateral HAD's, hamstrings---very tender with any attempted ROM  Motor:  Left lower extremity: 2/5 proximal distal, unchanged Right lower extremity: 1/5 proximal distal- LE exam unchanged.    Assessment/Plan: 1. Functional deficits secondary to lumbar spinal stenosis with neurogenic claudication which require 3+ hours per day of interdisciplinary therapy in a comprehensive inpatient rehab setting.  Physiatrist is providing close team supervision and 24 hour management of active medical problems listed below.  Physiatrist and rehab team continue to assess barriers to discharge/monitor patient progress toward functional and medical goals  Care Tool:  Bathing    Body parts bathed by patient: Chest, Abdomen, Face   Body parts bathed by helper: Buttocks     Bathing assist Assist Level: Maximal Assistance - Patient 24 - 49%     Upper Body Dressing/Undressing Upper body dressing   What is the patient wearing?: Pull over shirt    Upper body assist Assist Level: Moderate Assistance - Patient 50 - 74%    Lower Body Dressing/Undressing Lower body dressing      What is the patient wearing?: Pants, Underwear/pull up     Lower body assist Assist for lower body dressing: 2 Helpers     Toileting Toileting    Toileting assist Assist for toileting: Dependent - Patient 0%     Transfers Chair/bed transfer  Transfers assist     Chair/bed transfer assist level: Dependent - mechanical lift     Locomotion Ambulation   Ambulation assist   Ambulation activity did not occur: Safety/medical concerns  Assist level: 2 helpers Assistive device: Parallel bars Max distance: 5'   Walk 10 feet activity   Assist  Walk 10 feet activity did not occur: Safety/medical concerns        Walk 50 feet activity   Assist Walk 50 feet with 2 turns activity did  not occur: Safety/medical concerns         Walk 150 feet activity   Assist Walk 150 feet activity did not occur: Safety/medical concerns         Walk 10 feet on uneven surface  activity   Assist Walk 10 feet on uneven surfaces activity did not occur: Safety/medical concerns          Wheelchair     Assist Will patient use wheelchair at discharge?: Yes Type of Wheelchair: Manual    Wheelchair assist level: Supervision/Verbal cueing Max wheelchair distance: 66'    Wheelchair 50 feet with 2 turns activity    Assist        Assist Level: Supervision/Verbal cueing   Wheelchair 150 feet activity     Assist      Assist Level: Supervision/Verbal cueing   Blood pressure 116/61, pulse 84, temperature 98.9 F (37.2 C), temperature source Oral, resp. rate 17, height 5\' 7"  (1.702 m), weight 95.3 kg, SpO2 (!) 88 %.    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  Per neurosurgery, follow-up with outpatient after NCS/EMG.  Neurology consulted recommended management of spasticity and spasms with outpatient NCS/EMG  5/3- Asked Neuro to come back for recs- but  they recommended was what we discussed, a ITB pump, which cannot be placed inpt. Didn't address NSU note at all.   5/4- will get MRI of cervical spine- since cannot find anything else to focus on-  5/5- spoke with NSU and Neurology- decided on doing CT myelogram of entire spine-ordered and to go this AM  5/6- will call Dr Zada Finders about ITB pump and CT  myelogram plan-   5/9- going to OR Monday- for   L3-4 decompression    Continue CIR as able 2. Antithrombotics: -DVT/anticoagulation:Lovenox.   4/12- Dopplers (-)- con't lovenox -antiplatelet therapy: Aspirin 81 mg daily 3. Pain Management:Neurontin 100 mg nightly, OxyContin 10 mg every 12 hours, hydrocodone as needed as well as Imitrex for headaches  baclofen to 15 mg TID- with meals and 20 mg QHS, increase nighttime dose to 30mg  on 5/1 with improvement in resting spasms  Tizanidine 2 mg 3 times daily  5/3- changed breakfast time Baclofen to 0600 since spasms so bad in AM-   5/4- increase baclofen to 20 mg TID and 30 mg QHS  5/5- spasms no better-   5/6- increased baclofen a lot to 30 mg TID and 40 mg QHS- so on 130 mg/day- can't increase much more.  5/8- tone still severe. Increased tizanidine to 4mg  TID  4. Mood:Provide emotional support -antipsychotic agents: N/A 5. Neuropsych: This patientiscapable of making decisions on hisown behalf. 6. Skin/Wound Care:place steristrips over right index finger lac.                          -  continue wet to dry to chronic left lateral malleolus wound (stage -unstagable) 80% slough per nursing note.[name]  PRAFOs to keep feet from turning out/rotating out, if unable to tolerate discussed use of Prevalon boots.  Foam dressing left knee  Barrier cream MASD on sacrum  5/3- using Prevalon boots  5/6- will call WOC to help- also on air bed.   5/7- got CT of L ankle ot look for osteomyelitis- was (-) but wound care make recs- changed orders. Air bed and DTI of L/centrla buttcok  5/8 continue above plan 7. Fluids/Electrolytes/Nutrition:Routine in and outs  Calcium 8.5 on 4/26-started calcium supplement, labs ordered for tomorrow 8. Chronic restless leg syndrome. Sinemet 10-100 mg twice daily.  See #3 9. 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 OSA with CPAP/2 L chronic oxygen at home. Continue inhalers 11. 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 Follow-up outpatient 12. Diabetes mellitus. Hemoglobin A1c 5.8. SSI. Changed to [facility] HS for CBG and SSI  HbA1c 5.8- 13. Neurogenic bowel and bladder. Check PVR  Incontinent BMs 14. Hypothyroidism.  Continue Synthroid 15. Chronic diastolic congestive heart failure. Monitor for any signs of fluid overload. Check regular weights   Filed Weights   10/21/19 0500 10/22/19 0506 10/26/19 0518  Weight: 98.7 kg 92.5 kg 95.3 kg   5/4- up to 98.7 kg- up 3 kg in a 2 days- will monitor  5/8-9 weights inconsistent 16. Hyperlipidemia. Zocor 17. Code status: Changed to DNR as per patient's wishes. 18. Leukocytosis - pneumonia- Resolved 19. Wheezing  Resolved on abx, scheduled umeclidinium  4/28- pulmonary saw pt- maximized meds  5/4- improved this AM- feeling SOB when has spasms- thinks it affects diaphragm- can see that 20 Spasticity vs tone in hamstrings, chronically WC bound may be contracted  See #3 21. Neurogenic bladder  4/28-started Flomax 0.4 mg daily and place foley per wishes of pt.   5/3- will increase flomax to 0.8 mg to try and see if will void once foley try to remove.   5/9 low grade temp recorded x1. Observe for now 22.  Labile blood pressure  Monitor for trend   LOS: 30 days A FACE TO [place] 10/26/2019, 8:21 AM

## 2019-10-26 NOTE — Progress Notes (Signed)
Microbiology called about a positive MRSA & staph aureus for the patient. Dr Naaman Plummer ordered for contact precautions, however,  Infectious disease on-call said no contact precautions needed. Neurosurgery on-call notified.

## 2019-10-26 NOTE — Progress Notes (Addendum)
Discharge cancelled to not being able to go to surgery due to medical issues. Continued with skilled OT with new qd orders.  Occupational Therapy Discharge Summary  Patient Details  Name: [name] MRN: [id number] Date of Birth: Sep 26, 1940  Patient has met 0 of 8 long term goals due to functional decline since admission.  Pt's function and ability to participate in BADLs during this admission has deteriorated secondary to increasing BLE spasms and generalized weakness. Pt discharging to Acute for lumbar decompression surgery 5/10.  Reasons goals not met: secondary to functional decline  Recommendation:  Pt going to surgery with plans for him to return at inpatient rehab if able  Equipment: No equipment provided  Reasons for discharge: Change in medical status- surgery    OT Discharge Vision Baseline Vision/History: Wears glasses Wears Glasses: Reading only Patient Visual Report: No change from baseline Vision Assessment?: No apparent visual deficits Perception  Perception: Within Functional Limits Praxis Praxis: Intact Cognition Overall Cognitive Status: Within Functional Limits for tasks assessed Arousal/Alertness: Awake/alert Orientation Level: Oriented X4 Attention: Focused;Sustained Focused Attention: Appears intact Sustained Attention: Appears intact Memory: Appears intact Immediate Memory Recall: Sock;Blue;Bed Memory Recall Sock: Without Cue Memory Recall Blue: Without Cue Memory Recall Bed: Without Cue Awareness: Appears intact Problem Solving: Appears intact Safety/Judgment: Appears intact Sensation Sensation Light Touch: Impaired Detail Peripheral sensation comments: reports R UE and LE sensation differen then L Light Touch Impaired Details: Impaired RLE;Impaired RUE Hot/Cold: Impaired Detail Hot/Cold Impaired Details: Impaired RUE;Impaired LUE;Impaired RLE;Impaired LLE Proprioception: Impaired Detail Proprioception Impaired Details: Impaired  RLE;Impaired LLE Stereognosis: Not tested Coordination Gross Motor Movements are Fluid and Coordinated: No Fine Motor Movements are Fluid and Coordinated: No Coordination and Movement Description: impaired due to B LE paresis, impaired balance, and poor activity tolerance Motor  Motor Motor: Other (comment);Abnormal postural alignment and control;Abnormal tone Motor - Discharge Observations: impaired 2/2 incomplete paraplegia and LE muscle spasms    Trunk/Postural Assessment  Cervical Assessment Cervical Assessment: Exceptions to WFL(forward head) Thoracic Assessment Thoracic Assessment: Exceptions to WFL(kyphotic) Lumbar Assessment Lumbar Assessment: Exceptions to WFL(posterior pelvic tilt) Postural Control Postural Control: Deficits on evaluation Righting Reactions: delayed, inadequate, and poor motor planning Protective Responses: delayed, inadequate, and poor motor planning Postural Limitations: decreased with R lateral lean with fatigue  Balance Static Sitting Balance Static Sitting - Balance Support: Feet supported;Bilateral upper extremity supported Static Sitting - Level of Assistance: 4: Min assist Dynamic Sitting Balance Dynamic Sitting - Balance Support: Feet supported Dynamic Sitting - Level of Assistance: 2: Max assist Extremity/Trunk Assessment RUE Assessment Passive Range of Motion (PROM) Comments: WFL Active Range of Motion (AROM) Comments: shoulder flex/abd 90 General Strength Comments: proximal weakness limits OH reach LUE Assessment Passive Range of Motion (PROM) Comments: WFL Active Range of Motion (AROM) Comments: sh flex/abd 90 General Strength Comments: proximal weakness limits OH reach   [name] 10/26/2019, 9:22 AM

## 2019-10-26 NOTE — Progress Notes (Signed)
Occupational Therapy Session Note  Patient Details  Name: [name] MRN: [id number] Date of Birth: May 31, 1941  Today's Date: 10/26/2019 OT Individual Time: 0815-0900 OT Individual Time Calculation (min): 45 min  and Today's Date: 10/26/2019 OT Missed Time: 30 Minutes Missed Time Reason: Patient fatigue;Other (comment)(pt unable to keep eyes open to actively participate)   Short Term Goals: Week 5:  OT Short Term Goal 1 (Week 5): STG=LTG secondary to ELOS (LTG downgraded due to lack of progress)  Skilled Therapeutic Interventions/Progress Updates:    Pt asleep in bed upon arrival but easily aroused.  Pt commented that he couldn't keep his eyes open even though he had been "wide awake" earlier in the morning. OT goal for session included dressing at bed level and transferring to w/c with Maxi Move. Pt unable to keep eyes open and would fall asleep while engaging in conversation or while donning pants/Ted Hose with assistance from therapist. All dressing tasks at max A this morning with constant verbal cues for arousal. Bed mobility included rolling R/L to facilitate pulling pants over hips and repositioing in bed to upright position. Pt unable to activley participating in therapy 2/2 lethargy. Pt missed 30 mins skilled OT services. Pt remained in bed with all needs within reach.   Therapy Documentation Precautions:  Precautions Precautions: Fall Restrictions Weight Bearing Restrictions: No General: General OT Amount of Missed Time: 30 Minutes Pain: Pain Assessment Pain Scale: 0-10 Pain Score: 7  Pain Type: Acute pain Pain Location: Ankle Pain Orientation: Left Pain Descriptors / Indicators: Sharp Pain Frequency: Constant Pain Onset: On-going Patients Stated Pain Goal: 2 Pain Intervention(s): Meds admin prior to therapy; repositined Therapy/Group: Individual Therapy  [name] 10/26/2019, 9:12 AM

## 2019-10-26 NOTE — Progress Notes (Addendum)
Patient c/o being very drowsy and groggy this morning. Patient and his wife wanted to know what all he took last night and this morning. Reviewed medications with them. Patient states no other symptoms besides being drowsy. VS WNL and patient is oriented x 4. Notified provider, Dr. Naaman Plummer. Will monitor for any new/worsening symptoms. Provider ordered to hold 1400 zanaflex dose.

## 2019-10-26 NOTE — Plan of Care (Signed)
  Problem: Consults Goal: RH SPINAL CORD INJURY PATIENT EDUCATION Description:  See Patient Education module for education specifics.  Outcome: Progressing Goal: Skin Care Protocol Initiated - if Braden Score 18 or less Description: If consults are not indicated, leave blank or document N/A Outcome: Progressing   Problem: SCI BOWEL ELIMINATION Goal: RH STG MANAGE BOWEL WITH ASSISTANCE Description: STG Manage Bowel with min Assistance. Outcome: Progressing Goal: RH STG SCI MANAGE BOWEL PROGRAM W/ASSIST OR AS APPROPRIATE Description: STG SCI Manage bowel program w/ min assist or as appropriate. Outcome: Progressing   Problem: SCI BLADDER ELIMINATION Goal: RH STG MANAGE BLADDER WITH ASSISTANCE Description: STG Manage Bladder With min/mod Assistance Outcome: Progressing   Problem: RH SKIN INTEGRITY Goal: RH STG SKIN FREE OF INFECTION/BREAKDOWN Description: Patients skin will remain free from further infection or breakdown with mod assist. Outcome: Progressing Goal: RH STG MAINTAIN SKIN INTEGRITY WITH ASSISTANCE Description: STG Maintain Skin Integrity With mod Assistance. Outcome: Progressing Goal: RH STG ABLE TO PERFORM INCISION/WOUND CARE W/ASSISTANCE Description: STG Able To Perform Incision/Wound Care With total Assistance from caregiver. Outcome: Progressing   Problem: RH SAFETY Goal: RH STG ADHERE TO SAFETY PRECAUTIONS W/ASSISTANCE/DEVICE Description: STG Adhere to Safety Precautions With mod I Assistance/Device. Outcome: Progressing   Problem: RH PAIN MANAGEMENT Goal: RH STG PAIN MANAGED AT OR BELOW PT'S PAIN GOAL Description: < 4 Outcome: Progressing

## 2019-10-27 ENCOUNTER — Encounter (HOSPITAL_COMMUNITY): Payer: Self-pay | Admitting: Physical Medicine and Rehabilitation

## 2019-10-27 ENCOUNTER — Inpatient Hospital Stay (HOSPITAL_COMMUNITY): Payer: Medicare Other

## 2019-10-27 ENCOUNTER — Encounter (HOSPITAL_COMMUNITY): Payer: Self-pay | Admitting: Certified Registered Nurse Anesthetist

## 2019-10-27 ENCOUNTER — Encounter (HOSPITAL_COMMUNITY)
Admission: RE | Disposition: A | Discharge status: Other Acute Inpatient Hospital | Payer: Self-pay | Source: Other Acute Inpatient Hospital | Attending: Physical Medicine and Rehabilitation

## 2019-10-27 LAB — CBC WITH DIFFERENTIAL/PLATELET
Abs Immature Granulocytes: 0.15 10*3/uL — ABNORMAL HIGH (ref 0.00–0.07)
Basophils Absolute: 0.1 10*3/uL (ref 0.0–0.1)
Basophils Relative: 1 %
Eosinophils Absolute: 0.3 10*3/uL (ref 0.0–0.5)
Eosinophils Relative: 2 %
HCT: 36.3 % — ABNORMAL LOW (ref 39.0–52.0)
Hemoglobin: 11.5 g/dL — ABNORMAL LOW (ref 13.0–17.0)
Immature Granulocytes: 1 %
Lymphocytes Relative: 7 %
Lymphs Abs: 1.2 10*3/uL (ref 0.7–4.0)
MCH: 29 pg (ref 26.0–34.0)
MCHC: 31.7 g/dL (ref 30.0–36.0)
MCV: 91.4 fL (ref 80.0–100.0)
Monocytes Absolute: 1.4 10*3/uL — ABNORMAL HIGH (ref 0.1–1.0)
Monocytes Relative: 9 %
Neutro Abs: 12.4 10*3/uL — ABNORMAL HIGH (ref 1.7–7.7)
Neutrophils Relative %: 80 %
Platelets: 313 10*3/uL (ref 150–400)
RBC: 3.97 MIL/uL — ABNORMAL LOW (ref 4.22–5.81)
RDW: 14.6 % (ref 11.5–15.5)
WBC: 15.5 10*3/uL — ABNORMAL HIGH (ref 4.0–10.5)
nRBC: 0 % (ref 0.0–0.2)

## 2019-10-27 LAB — BASIC METABOLIC PANEL
Anion gap: 11 (ref 5–15)
BUN: 10 mg/dL (ref 8–23)
CO2: 31 mmol/L (ref 22–32)
Calcium: 8.8 mg/dL — ABNORMAL LOW (ref 8.9–10.3)
Chloride: 99 mmol/L (ref 98–111)
Creatinine, Ser: 0.73 mg/dL (ref 0.61–1.24)
GFR calc Af Amer: >60 mL/min (ref >60)
GFR calc non Af Amer: >60 mL/min (ref >60)
Glucose, Bld: 104 mg/dL — ABNORMAL HIGH (ref 70–99)
Potassium: 3.8 mmol/L (ref 3.5–5.1)
Sodium: 141 mmol/L (ref 135–145)

## 2019-10-27 IMAGING — DX DG CHEST 1V PORT
2 series · 2 of 2 positions shown · non-contrast
Comparison: Full-thickness ray dated [DATE].

CLINICAL DATA: Increasing shortness of breath.

EXAM:
PORTABLE CHEST 1 VIEW

[chest ap (1 of 2)]
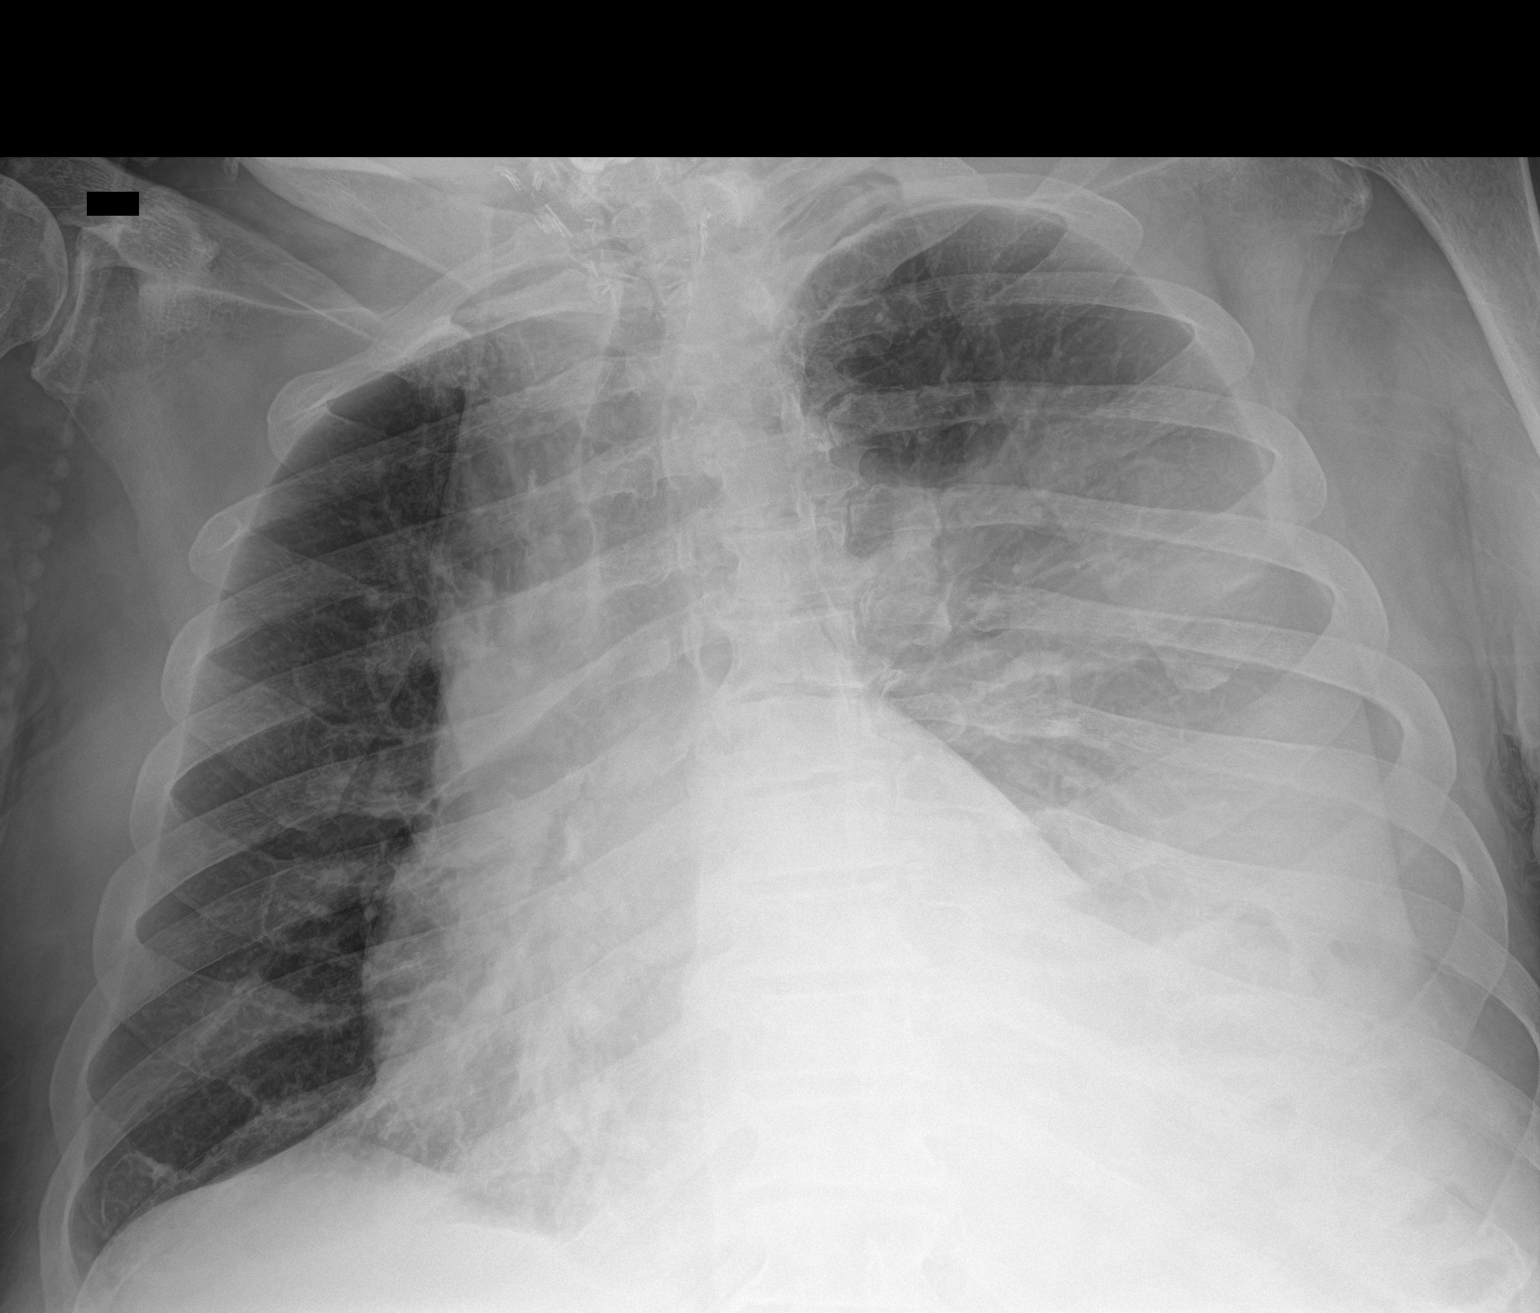

[chest ap (2 of 2)]
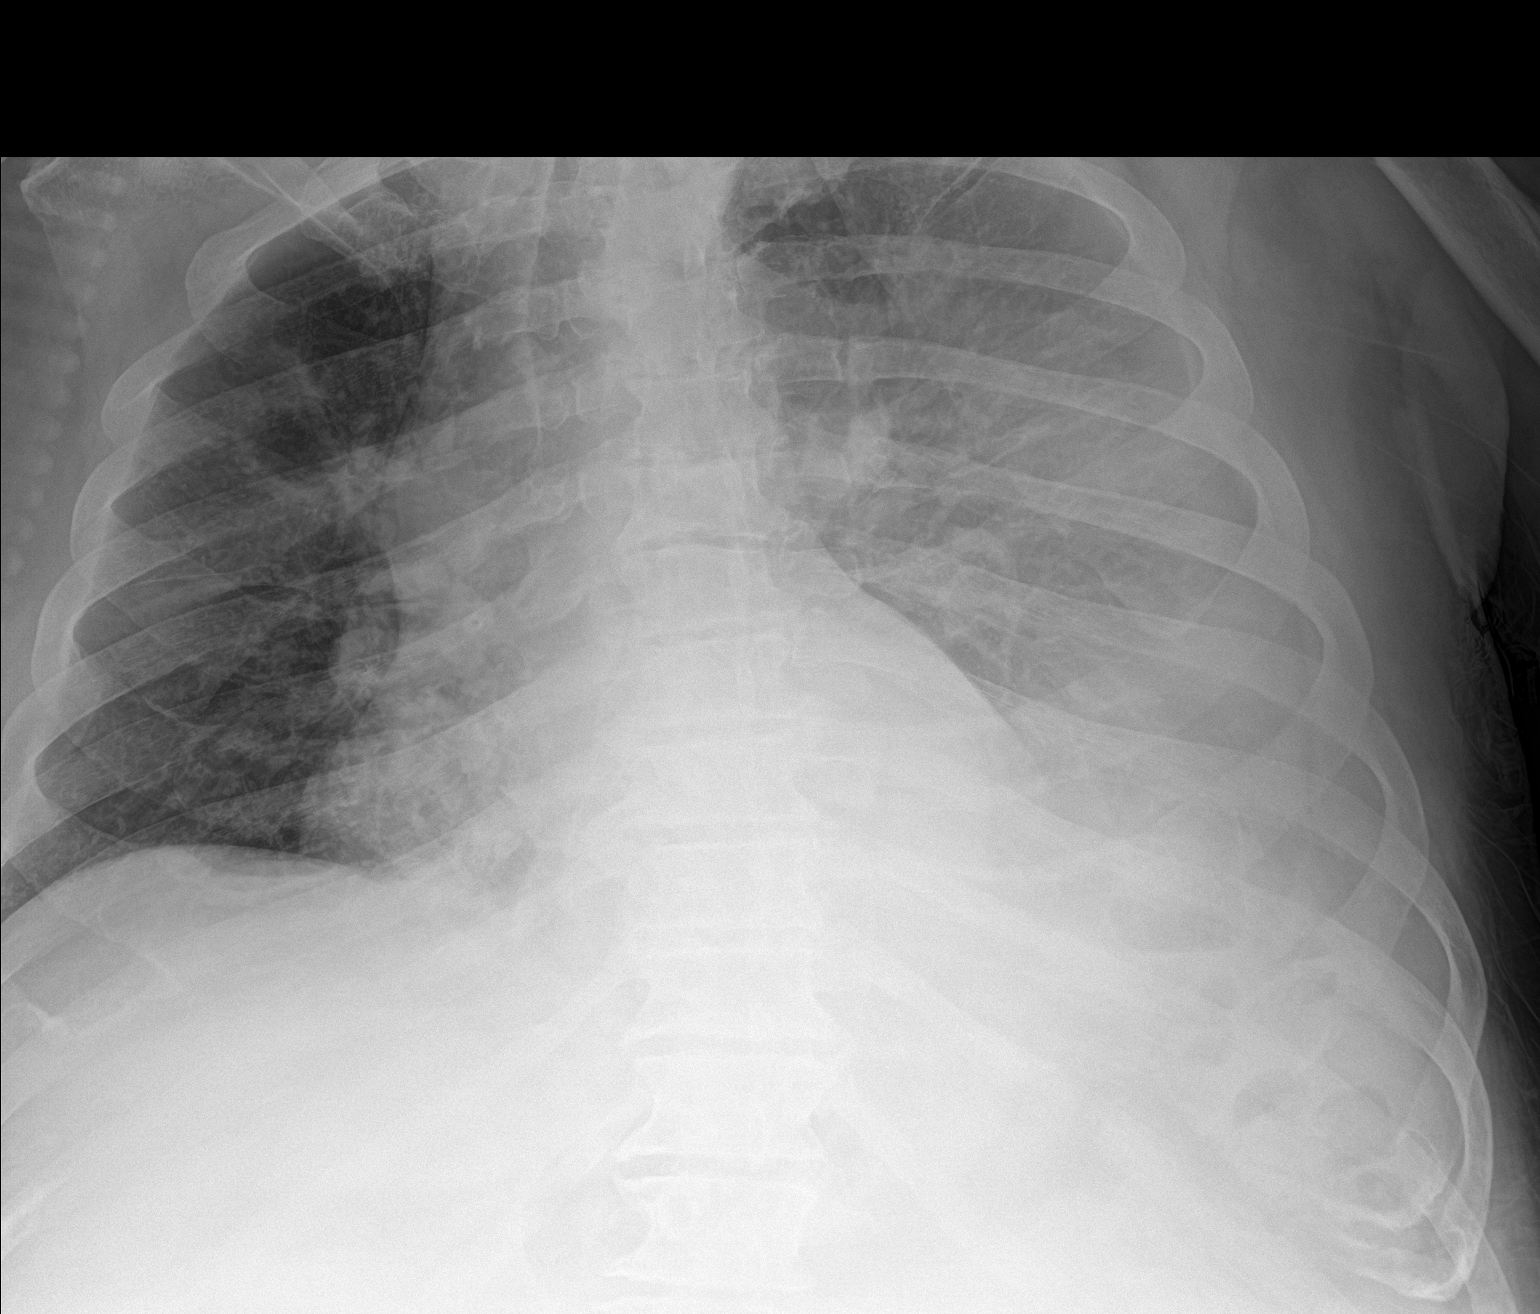

[2 of 2 positions shown; findings below may reference images not displayed]

FINDINGS: Stable cardiomediastinal silhouette. Normal pulmonary vascularity.
Unchanged small to moderate left pleural effusion with left basilar
atelectasis. No pneumothorax. Unchanged elevation of left
hemidiaphragm. No acute osseous abnormality.
IMPRESSION: 1. Unchanged left pleural effusion with associated basilar
atelectasis.

## 2019-10-27 SURGERY — LUMBAR LAMINECTOMY/ DECOMPRESSION WITH MET-RX
Anesthesia: General

## 2019-10-27 MED ORDER — FUROSEMIDE 10 MG/ML IJ SOLN
40.0000 mg | Freq: Once | INTRAMUSCULAR | Status: AC
  Administered 2019-10-27: 40 mg via INTRAVENOUS
  Filled 2019-10-27: qty 4

## 2019-10-27 MED ORDER — SODIUM CHLORIDE 0.9 % IV SOLN
2.0000 g | Freq: Three times a day (TID) | INTRAVENOUS | Status: DC
  Administered 2019-10-27 – 2019-10-28 (×3): 2 g via INTRAVENOUS
  Filled 2019-10-27 (×5): qty 2

## 2019-10-27 MED ORDER — SODIUM CHLORIDE 0.9 % IV SOLN
2.0000 g | Freq: Once | INTRAVENOUS | Status: AC
  Administered 2019-10-27: 2 g via INTRAVENOUS
  Filled 2019-10-27: qty 2

## 2019-10-27 MED ORDER — VANCOMYCIN HCL 1750 MG/350ML IV SOLN
1750.0000 mg | Freq: Once | INTRAVENOUS | Status: AC
  Administered 2019-10-27: 1750 mg via INTRAVENOUS
  Filled 2019-10-27: qty 350

## 2019-10-27 MED ORDER — VANCOMYCIN HCL 1250 MG/250ML IV SOLN
1250.0000 mg | Freq: Two times a day (BID) | INTRAVENOUS | Status: DC
  Administered 2019-10-28 (×2): 1250 mg via INTRAVENOUS
  Filled 2019-10-27 (×3): qty 250

## 2019-10-27 NOTE — Progress Notes (Signed)
Pharmacy Antibiotic Note  [name] is a 79 y.o. male admitted on 09/26/2019 with pneumonia and corynebacterium UTI.  Pharmacy has been consulted for vancomycin and cefepime dosing for PNA.  WBC up to 15.5, afebrile. CXR unchanged left pleural effusion with associated basilar atelectasis.    Plan: Vancomycin 1750 mg IV load then 1250 mg IV q12h Cefepime 2 g IV q8h  Monitor renal function, clinical progress, cultures/sensitivities F/U LOT and de-escalate as able Vancomycin trough as clinically indicated      Height: 5\' 7"  (170.2 cm) Weight: 95.2 kg (209 lb 14.1 oz) IBW/kg (Calculated) : 66.1  Temp (24hrs), Avg:99 F (37.2 C), Min:97.9 F (36.6 C), Max:99.8 F (37.7 C)  Recent Labs  Lab 10/27/19 0500  WBC 15.5*  CREATININE 0.73    Estimated Creatinine Clearance: 83.6 mL/min (by C-G formula based on SCr of 0.73 mg/dL).    No Known Allergies  Antimicrobials this admission: Vanc 4/21>>4/27; 5/10>> Cefepime 4/21>> 4/27; 5/10>>    Microbiology results: 5/9 MRSA + Staph PCR + 4/21: Bcx: ngtd 4/20 UCx: corynebacter 4/20 UA: large Leuk, WBC >50 4/20 MRSA PCR: positive 3/31 BCx ngtd 3/31 UCx + 30K corynebacter   Thank you for involving pharmacy in this patient's care.  Renold Genta, PharmD, BCPS Clinical Pharmacist Clinical phone for 10/27/2019 until 3p is [phone] 10/27/2019 9:47 AM  **Pharmacist phone directory can be found on [email or web address] listed under [place]**

## 2019-10-27 NOTE — Progress Notes (Signed)
Physical Therapy Note  Patient Details  Name: [name] MRN: [id number] Date of Birth: 1940-10-03 Today's Date: 10/27/2019    Patient therapy scheduled changed to QD after discussion with primary OT and medical team due to decrease in overall tolerance and endurance for therapy sessions and onset of PNA.   Excell Seltzer, PT, DPT  10/27/2019, 12:04 PM

## 2019-10-27 NOTE — Plan of Care (Signed)
  Problem: Consults Goal: RH SPINAL CORD INJURY PATIENT EDUCATION Description:  See Patient Education module for education specifics.  Outcome: Progressing Goal: Skin Care Protocol Initiated - if Braden Score 18 or less Description: If consults are not indicated, leave blank or document N/A Outcome: Progressing   Problem: SCI BOWEL ELIMINATION Goal: RH STG MANAGE BOWEL WITH ASSISTANCE Description: STG Manage Bowel with min Assistance. Outcome: Progressing Goal: RH STG SCI MANAGE BOWEL PROGRAM W/ASSIST OR AS APPROPRIATE Description: STG SCI Manage bowel program w/ min assist or as appropriate. Outcome: Progressing   Problem: SCI BLADDER ELIMINATION Goal: RH STG MANAGE BLADDER WITH ASSISTANCE Description: STG Manage Bladder With min/mod Assistance Outcome: Progressing   Problem: RH SKIN INTEGRITY Goal: RH STG SKIN FREE OF INFECTION/BREAKDOWN Description: Patients skin will remain free from further infection or breakdown with mod assist. Outcome: Progressing Goal: RH STG MAINTAIN SKIN INTEGRITY WITH ASSISTANCE Description: STG Maintain Skin Integrity With mod Assistance. Outcome: Progressing Goal: RH STG ABLE TO PERFORM INCISION/WOUND CARE W/ASSISTANCE Description: STG Able To Perform Incision/Wound Care With total Assistance from caregiver. Outcome: Progressing   Problem: RH SAFETY Goal: RH STG ADHERE TO SAFETY PRECAUTIONS W/ASSISTANCE/DEVICE Description: STG Adhere to Safety Precautions With mod I Assistance/Device. Outcome: Progressing   Problem: RH PAIN MANAGEMENT Goal: RH STG PAIN MANAGED AT OR BELOW PT'S PAIN GOAL Description: < 4 Outcome: Progressing

## 2019-10-27 NOTE — Progress Notes (Signed)
Social Work Patient ID: [name], male   DOB: 1941-02-02, 79 y.o.   MRN: [id number]   Per medical team, pt d/c date on hold due to medical reasons.  Loralee Pacas, MSW, [place] Office: [phone] Cell: [phone] Fax: [phone]

## 2019-10-27 NOTE — Progress Notes (Signed)
[facility] PHYSICAL MEDICINE & REHABILITATION PROGRESS NOTE    Pt reports feels more SOB this AM- wants to go back on CPAPis scared wouldn't make it through surgery.  -   Gave pt Lasix 40 mg IV- have asked surgeon if they want to continue surgery- his WBC is up to 15.5- likely Pneumonia since is much more SOB than his normal.  Will put on Vanc (since is now MRSA (+)) and Cefepime - worked well last time- will also get another CXR.     ROS:  Pt denies , abd pain, CP, N/V/C/D, and vision changes- has increased SOB   Objective:   No results found. Recent Labs    10/27/19 0500  WBC 15.5*  HGB 11.5*  HCT 36.3*  PLT 313   Recent Labs    10/27/19 0500  NA 141  K 3.8  CL 99  CO2 31  GLUCOSE 104*  BUN 10  CREATININE 0.73  CALCIUM 8.8*    Intake/Output Summary (Last 24 hours) at 10/27/2019 0842 Last data filed at 10/27/2019 0325 Gross per 24 hour  Intake 260 ml  Output 1700 ml  Net -1440 ml     Physical Exam: Vital Signs Blood pressure 135/79, pulse 93, temperature 99.8 F (37.7 C), temperature source Oral, resp. rate 20, height 5\' 7"  (1.702 m), weight 95.2 kg, SpO2 93 %. Constitutional: laying supine in bed- sounds more congested audibly, on 3.5L o2 [place], NAD HEENT: spittle at sides of mouth; conjugate gaze Neck: supple Cardiovascular: RRR    Respiratory/Chest: very coarse, wheezing; sounds worse- prenebs; had to put back on CPAP machine while there GI/Abdomen: Soft, NT, ND, (+)BS  Ext: no clubbing, cyanosis, or edema Skin: buttock and coccyx--wound with slough. Left ankle not visualized  Psych:anxious about breathing and surgery Musc: No edema in extremities.  No tenderness in extremities. Neurologic:significant tone 3/4 bilateral HAD's, hamstrings---very tender with any attempted ROM  Motor:  Left lower extremity: 2/5 proximal distal, unchanged Right lower extremity: 1/5 proximal distal- LE exam unchanged.   Assessment/Plan: 1. Functional deficits secondary  to lumbar spinal stenosis with neurogenic claudication which require 3+ hours per day of interdisciplinary therapy in a comprehensive inpatient rehab setting.  Physiatrist is providing close team supervision and 24 hour management of active medical problems listed below.  Physiatrist and rehab team continue to assess barriers to discharge/monitor patient progress toward functional and medical goals  Care Tool:  Bathing    Body parts bathed by patient: Chest, Abdomen, Face   Body parts bathed by helper: Right arm, Left arm, Front perineal area, Buttocks, Right lower leg, Left lower leg     Bathing assist Assist Level: Maximal Assistance - Patient 24 - 49%     Upper Body Dressing/Undressing Upper body dressing   What is the patient wearing?: Pull over shirt    Upper body assist Assist Level: Total Assistance - Patient < 25%    Lower Body Dressing/Undressing Lower body dressing      What is the patient wearing?: Incontinence brief, Pants     Lower body assist Assist for lower body dressing: Dependent - Patient 0%     Toileting Toileting    Toileting assist Assist for toileting: Dependent - Patient 0%     Transfers Chair/bed transfer  Transfers assist     Chair/bed transfer assist level: Dependent - mechanical lift     Locomotion Ambulation   Ambulation assist   Ambulation activity did not occur: Safety/medical concerns  Assist level: 2 helpers  Assistive device: Parallel bars Max distance: 5'   Walk 10 feet activity   Assist  Walk 10 feet activity did not occur: Safety/medical concerns        Walk 50 feet activity   Assist Walk 50 feet with 2 turns activity did not occur: Safety/medical concerns         Walk 150 feet activity   Assist Walk 150 feet activity did not occur: Safety/medical concerns         Walk 10 feet on uneven surface  activity   Assist Walk 10 feet on uneven surfaces activity did not occur: Safety/medical  concerns         Wheelchair     Assist Will patient use wheelchair at discharge?: Yes Type of Wheelchair: Manual    Wheelchair assist level: Supervision/Verbal cueing Max wheelchair distance: 2'    Wheelchair 50 feet with 2 turns activity    Assist        Assist Level: Supervision/Verbal cueing   Wheelchair 150 feet activity     Assist      Assist Level: Supervision/Verbal cueing   Blood pressure 135/79, pulse 93, temperature 99.8 F (37.7 C), temperature source Oral, resp. rate 20, height 5\' 7"  (1.702 m), weight 95.2 kg, SpO2 93 %.    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  Per neurosurgery, follow-up with outpatient after NCS/EMG.  Neurology consulted recommended management of spasticity and spasms with outpatient NCS/EMG  5/3- Asked Neuro to come back for recs- but  they recommended was what we discussed, a ITB pump, which cannot be placed inpt. Didn't address NSU note at all.   5/4- will get MRI of cervical spine- since cannot find anything else to focus on-  5/5- spoke with NSU and Neurology- decided on doing CT myelogram of entire spine-ordered and to go this AM  5/6- will call Dr Zada Finders about ITB pump  and CT myelogram plan-   5/9- going to OR Monday- for   L3-4 decompression  5/10- hold OR time- since likely has pneumonia again.     Continue CIR as able 2. Antithrombotics: -DVT/anticoagulation:Lovenox.   4/12- Dopplers (-)- con't lovenox -antiplatelet therapy: Aspirin 81 mg daily 3. Pain Management:Neurontin 100 mg nightly, OxyContin 10 mg every 12 hours, hydrocodone as needed as well as Imitrex for headaches  baclofen to 15 mg TID- with meals and 20 mg QHS, increase nighttime dose to 30mg  on 5/1 with improvement in resting spasms  Tizanidine 2 mg 3 times daily  5/3- changed breakfast time Baclofen to 0600 since spasms so bad in AM-   5/4- increase baclofen to 20 mg TID and 30 mg QHS  5/5- spasms no better-   5/6- increased baclofen a lot to 30  mg TID and 40 mg QHS- so on 130 mg/day- can't increase much more.  5/8- tone still severe. Increased tizanidine to 4mg  TID  4. Mood:Provide emotional support -antipsychotic agents: N/A 5. Neuropsych: This patientiscapable of making decisions on hisown behalf. 6. Skin/Wound Care:place steristrips over right index finger lac.                          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  Foam dressing left knee  Barrier cream MASD on sacrum  5/3- using Prevalon boots  5/6- will call WOC to help- also on air bed.   5/7- got CT of L ankle ot look for osteomyelitis- was (-) but wound care make recs- changed orders. Air bed and DTI of L/centrla buttcok  5/8 continue above plan 7. Fluids/Electrolytes/Nutrition:Routine in and outs  Calcium 8.5 on 4/26-started calcium supplement, labs ordered for tomorrow 8. Chronic restless leg syndrome. Sinemet 10-100 mg twice daily.  See #3 9. 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 OSA with CPAP/2 L chronic oxygen at home. Continue inhalers 11. 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 Follow-up outpatient 12. Diabetes mellitus. Hemoglobin A1c 5.8. SSI. Changed to [facility] for CBG and SSI  HbA1c 5.8- 13. Neurogenic bowel and bladder. Check PVR  Incontinent BMs 14. Hypothyroidism.  Continue Synthroid 15. Chronic diastolic congestive heart failure. Monitor for any signs of fluid overload. Check regular weights   Filed Weights   10/22/19 0506 10/26/19 0518 10/27/19 0315  Weight: 92.5 kg 95.3 kg 95.2 kg   5/4- up to 98.7 kg- up 3 kg in a 2 days- will monitor  5/8-9 weights inconsistent  5/10- gave Lasix 40 mg IV x1- since more SOB this AM 16. Hyperlipidemia. Zocor 17. Code status: Changed to DNR as per patient's wishes. 18. Leukocytosis - pneumonia- Resolved 19. Wheezing  Resolved on abx, scheduled umeclidinium  4/28- pulmonary saw pt- maximized meds  5/4- improved this AM- feeling SOB when has spasms- thinks it affects diaphragm- can see that 20 Spasticity vs tone in hamstrings, chronically WC bound may be contracted  See #3 21. Neurogenic bladder  4/28-started Flomax 0.4 mg daily and place foley per wishes of pt.   5/3- will increase flomax to 0.8 mg to try and see if will void once foley try to remove.   5/9 low grade temp recorded x1. Observe for now 22.  Labile blood pressure  Monitor for trend 23/ Pneumonia- likely  5/10- added Vanc and Cefepime- and ordered CXR.    LOS: 31 days A FACE TO FACE EVALUATION WAS PERFORMED  Carlisa Eble 10/27/2019, 8:42 AM

## 2019-10-27 NOTE — Progress Notes (Signed)
Patient feels as if his cpap is not blowing air. He said it does not happen all the time. I explained to him that the cpap delivers pressure when it feels his effort to breath. He held his breath to simulate his concern and when he began to breath again the machine delivered the appropriate pressure. The screen on the cpap display has a screen saver that goes black after 1 minute. I assured him that the machine is still working and it is just the screen that darkens. I will recheck on him in 1 hour to ensure that he is ok per his request. His nurse is present and she assured him that she will call me if anything happens to the machine.

## 2019-10-28 ENCOUNTER — Inpatient Hospital Stay (HOSPITAL_COMMUNITY): Payer: Medicare Other | Admitting: *Deleted

## 2019-10-28 ENCOUNTER — Inpatient Hospital Stay (HOSPITAL_COMMUNITY): Payer: Medicare Other

## 2019-10-28 ENCOUNTER — Inpatient Hospital Stay (HOSPITAL_COMMUNITY)
Admission: AD | Admit: 2019-10-28 | Discharge: 2019-11-10 | DRG: 519 | Disposition: A | Discharge status: Rehabilitation Facility | Payer: Medicare Other | Source: Ambulatory Visit | Attending: Internal Medicine | Admitting: Internal Medicine

## 2019-10-28 ENCOUNTER — Other Ambulatory Visit: Payer: Self-pay

## 2019-10-28 ENCOUNTER — Encounter (HOSPITAL_COMMUNITY): Payer: Self-pay | Admitting: Internal Medicine

## 2019-10-28 DIAGNOSIS — R531 Weakness: Secondary | ICD-10-CM

## 2019-10-28 DIAGNOSIS — Z419 Encounter for procedure for purposes other than remedying health state, unspecified: Secondary | ICD-10-CM

## 2019-10-28 DIAGNOSIS — Z66 Do not resuscitate: Secondary | ICD-10-CM | POA: Diagnosis present

## 2019-10-28 DIAGNOSIS — G253 Myoclonus: Secondary | ICD-10-CM | POA: Diagnosis present

## 2019-10-28 DIAGNOSIS — Z6834 Body mass index (BMI) 34.0-34.9, adult: Secondary | ICD-10-CM | POA: Diagnosis not present

## 2019-10-28 DIAGNOSIS — J9621 Acute and chronic respiratory failure with hypoxia: Secondary | ICD-10-CM | POA: Diagnosis not present

## 2019-10-28 DIAGNOSIS — G2581 Restless legs syndrome: Secondary | ICD-10-CM | POA: Diagnosis present

## 2019-10-28 DIAGNOSIS — Z515 Encounter for palliative care: Secondary | ICD-10-CM | POA: Diagnosis not present

## 2019-10-28 DIAGNOSIS — E669 Obesity, unspecified: Secondary | ICD-10-CM | POA: Diagnosis present

## 2019-10-28 DIAGNOSIS — E1151 Type 2 diabetes mellitus with diabetic peripheral angiopathy without gangrene: Secondary | ICD-10-CM | POA: Diagnosis present

## 2019-10-28 DIAGNOSIS — I160 Hypertensive urgency: Secondary | ICD-10-CM | POA: Diagnosis present

## 2019-10-28 DIAGNOSIS — J449 Chronic obstructive pulmonary disease, unspecified: Secondary | ICD-10-CM | POA: Diagnosis present

## 2019-10-28 DIAGNOSIS — G4733 Obstructive sleep apnea (adult) (pediatric): Secondary | ICD-10-CM

## 2019-10-28 DIAGNOSIS — K5900 Constipation, unspecified: Secondary | ICD-10-CM | POA: Diagnosis present

## 2019-10-28 DIAGNOSIS — Z7951 Long term (current) use of inhaled steroids: Secondary | ICD-10-CM

## 2019-10-28 DIAGNOSIS — Z7982 Long term (current) use of aspirin: Secondary | ICD-10-CM | POA: Diagnosis not present

## 2019-10-28 DIAGNOSIS — M62838 Other muscle spasm: Secondary | ICD-10-CM | POA: Diagnosis not present

## 2019-10-28 DIAGNOSIS — I152 Hypertension secondary to endocrine disorders: Secondary | ICD-10-CM | POA: Diagnosis present

## 2019-10-28 DIAGNOSIS — Z09 Encounter for follow-up examination after completed treatment for conditions other than malignant neoplasm: Secondary | ICD-10-CM

## 2019-10-28 DIAGNOSIS — Z9989 Dependence on other enabling machines and devices: Secondary | ICD-10-CM

## 2019-10-28 DIAGNOSIS — Z7984 Long term (current) use of oral hypoglycemic drugs: Secondary | ICD-10-CM | POA: Diagnosis not present

## 2019-10-28 DIAGNOSIS — G8194 Hemiplegia, unspecified affecting left nondominant side: Secondary | ICD-10-CM | POA: Diagnosis present

## 2019-10-28 DIAGNOSIS — R32 Unspecified urinary incontinence: Secondary | ICD-10-CM | POA: Diagnosis present

## 2019-10-28 DIAGNOSIS — Z79899 Other long term (current) drug therapy: Secondary | ICD-10-CM

## 2019-10-28 DIAGNOSIS — Z20822 Contact with and (suspected) exposure to covid-19: Secondary | ICD-10-CM | POA: Diagnosis present

## 2019-10-28 DIAGNOSIS — Z85850 Personal history of malignant neoplasm of thyroid: Secondary | ICD-10-CM | POA: Diagnosis not present

## 2019-10-28 DIAGNOSIS — G8222 Paraplegia, incomplete: Secondary | ICD-10-CM | POA: Diagnosis present

## 2019-10-28 DIAGNOSIS — D72829 Elevated white blood cell count, unspecified: Secondary | ICD-10-CM | POA: Diagnosis present

## 2019-10-28 DIAGNOSIS — I5032 Chronic diastolic (congestive) heart failure: Secondary | ICD-10-CM | POA: Diagnosis present

## 2019-10-28 DIAGNOSIS — Z87891 Personal history of nicotine dependence: Secondary | ICD-10-CM | POA: Diagnosis not present

## 2019-10-28 DIAGNOSIS — E890 Postprocedural hypothyroidism: Secondary | ICD-10-CM | POA: Diagnosis present

## 2019-10-28 DIAGNOSIS — I110 Hypertensive heart disease with heart failure: Secondary | ICD-10-CM | POA: Diagnosis present

## 2019-10-28 DIAGNOSIS — M10072 Idiopathic gout, left ankle and foot: Secondary | ICD-10-CM | POA: Diagnosis present

## 2019-10-28 DIAGNOSIS — M48061 Spinal stenosis, lumbar region without neurogenic claudication: Secondary | ICD-10-CM | POA: Diagnosis not present

## 2019-10-28 DIAGNOSIS — I10 Essential (primary) hypertension: Secondary | ICD-10-CM | POA: Diagnosis not present

## 2019-10-28 DIAGNOSIS — K219 Gastro-esophageal reflux disease without esophagitis: Secondary | ICD-10-CM | POA: Diagnosis present

## 2019-10-28 DIAGNOSIS — I712 Thoracic aortic aneurysm, without rupture, unspecified: Secondary | ICD-10-CM | POA: Diagnosis present

## 2019-10-28 DIAGNOSIS — G959 Disease of spinal cord, unspecified: Secondary | ICD-10-CM | POA: Diagnosis not present

## 2019-10-28 DIAGNOSIS — N400 Enlarged prostate without lower urinary tract symptoms: Secondary | ICD-10-CM | POA: Diagnosis present

## 2019-10-28 DIAGNOSIS — G8220 Paraplegia, unspecified: Secondary | ICD-10-CM | POA: Diagnosis present

## 2019-10-28 DIAGNOSIS — M5106 Intervertebral disc disorders with myelopathy, lumbar region: Secondary | ICD-10-CM | POA: Diagnosis present

## 2019-10-28 DIAGNOSIS — M48062 Spinal stenosis, lumbar region with neurogenic claudication: Secondary | ICD-10-CM | POA: Diagnosis present

## 2019-10-28 DIAGNOSIS — G4730 Sleep apnea, unspecified: Secondary | ICD-10-CM | POA: Diagnosis present

## 2019-10-28 DIAGNOSIS — R0989 Other specified symptoms and signs involving the circulatory and respiratory systems: Secondary | ICD-10-CM

## 2019-10-28 DIAGNOSIS — M545 Low back pain: Secondary | ICD-10-CM | POA: Diagnosis present

## 2019-10-28 DIAGNOSIS — R159 Full incontinence of feces: Secondary | ICD-10-CM | POA: Diagnosis present

## 2019-10-28 DIAGNOSIS — Z79890 Hormone replacement therapy (postmenopausal): Secondary | ICD-10-CM

## 2019-10-28 DIAGNOSIS — R252 Cramp and spasm: Secondary | ICD-10-CM | POA: Diagnosis not present

## 2019-10-28 DIAGNOSIS — E039 Hypothyroidism, unspecified: Secondary | ICD-10-CM | POA: Diagnosis not present

## 2019-10-28 DIAGNOSIS — E785 Hyperlipidemia, unspecified: Secondary | ICD-10-CM | POA: Diagnosis present

## 2019-10-28 LAB — CBC WITH DIFFERENTIAL/PLATELET
Abs Immature Granulocytes: 0.18 10*3/uL — ABNORMAL HIGH (ref 0.00–0.07)
Basophils Absolute: 0.1 10*3/uL (ref 0.0–0.1)
Basophils Relative: 1 %
Eosinophils Absolute: 0.4 10*3/uL (ref 0.0–0.5)
Eosinophils Relative: 3 %
HCT: 37.6 % — ABNORMAL LOW (ref 39.0–52.0)
Hemoglobin: 11.7 g/dL — ABNORMAL LOW (ref 13.0–17.0)
Immature Granulocytes: 1 %
Lymphocytes Relative: 9 %
Lymphs Abs: 1.3 10*3/uL (ref 0.7–4.0)
MCH: 28.5 pg (ref 26.0–34.0)
MCHC: 31.1 g/dL (ref 30.0–36.0)
MCV: 91.5 fL (ref 80.0–100.0)
Monocytes Absolute: 1.7 10*3/uL — ABNORMAL HIGH (ref 0.1–1.0)
Monocytes Relative: 11 %
Neutro Abs: 11.5 10*3/uL — ABNORMAL HIGH (ref 1.7–7.7)
Neutrophils Relative %: 75 %
Platelets: 306 10*3/uL (ref 150–400)
RBC: 4.11 MIL/uL — ABNORMAL LOW (ref 4.22–5.81)
RDW: 14.8 % (ref 11.5–15.5)
WBC: 15.1 10*3/uL — ABNORMAL HIGH (ref 4.0–10.5)
nRBC: 0 % (ref 0.0–0.2)

## 2019-10-28 LAB — BASIC METABOLIC PANEL
Anion gap: 12 (ref 5–15)
BUN: 13 mg/dL (ref 8–23)
CO2: 31 mmol/L (ref 22–32)
Calcium: 9 mg/dL (ref 8.9–10.3)
Chloride: 99 mmol/L (ref 98–111)
Creatinine, Ser: 0.74 mg/dL (ref 0.61–1.24)
GFR calc Af Amer: >60 mL/min (ref >60)
GFR calc non Af Amer: >60 mL/min (ref >60)
Glucose, Bld: 97 mg/dL (ref 70–99)
Potassium: 3.6 mmol/L (ref 3.5–5.1)
Sodium: 142 mmol/L (ref 135–145)

## 2019-10-28 LAB — URINALYSIS, ROUTINE W REFLEX MICROSCOPIC
Bilirubin Urine: NEGATIVE
Glucose, UA: NEGATIVE mg/dL
Hgb urine dipstick: NEGATIVE
Ketones, ur: 5 mg/dL — AB
Leukocytes,Ua: NEGATIVE
Nitrite: NEGATIVE
Protein, ur: 30 mg/dL — AB
Specific Gravity, Urine: 1.029 (ref 1.005–1.030)
pH: 5 (ref 5.0–8.0)

## 2019-10-28 LAB — GLUCOSE, CAPILLARY: Glucose-Capillary: 94 mg/dL (ref 70–99)

## 2019-10-28 MED ORDER — LEVETIRACETAM 250 MG PO TABS
250.0000 mg | ORAL_TABLET | Freq: Two times a day (BID) | ORAL | Status: DC
  Administered 2019-10-28 – 2019-11-10 (×25): 250 mg via ORAL
  Filled 2019-10-28 (×27): qty 1

## 2019-10-28 MED ORDER — ALLOPURINOL 300 MG PO TABS
300.0000 mg | ORAL_TABLET | Freq: Every day | ORAL | Status: DC
  Administered 2019-10-29 – 2019-11-10 (×12): 300 mg via ORAL
  Filled 2019-10-28 (×12): qty 1

## 2019-10-28 MED ORDER — CARBIDOPA-LEVODOPA 10-100 MG PO TABS
1.0000 | ORAL_TABLET | Freq: Two times a day (BID) | ORAL | Status: DC
  Administered 2019-10-28 – 2019-11-10 (×26): 1 via ORAL
  Filled 2019-10-28 (×30): qty 1

## 2019-10-28 MED ORDER — TAMSULOSIN HCL 0.4 MG PO CAPS
0.8000 mg | ORAL_CAPSULE | Freq: Every day | ORAL | Status: DC
  Administered 2019-10-29 – 2019-11-09 (×12): 0.8 mg via ORAL
  Filled 2019-10-28 (×12): qty 2

## 2019-10-28 MED ORDER — LATANOPROST 0.005 % OP SOLN
1.0000 [drp] | Freq: Every day | OPHTHALMIC | Status: DC
  Administered 2019-10-28 – 2019-11-09 (×13): 1 [drp] via OPHTHALMIC
  Filled 2019-10-28: qty 2.5

## 2019-10-28 MED ORDER — POLYETHYLENE GLYCOL 3350 17 GM/SCOOP PO POWD: 17.0000 g | ORAL | Status: DC

## 2019-10-28 MED ORDER — LABETALOL HCL 5 MG/ML IV SOLN: 10.0000 mg | INTRAVENOUS | Status: DC | PRN

## 2019-10-28 MED ORDER — ACETAMINOPHEN 325 MG PO TABS: 650.0000 mg | ORAL_TABLET | Freq: Four times a day (QID) | ORAL | Status: DC | PRN

## 2019-10-28 MED ORDER — ALBUTEROL SULFATE (2.5 MG/3ML) 0.083% IN NEBU: 2.5000 mg | INHALATION_SOLUTION | Freq: Four times a day (QID) | RESPIRATORY_TRACT | Status: DC | PRN

## 2019-10-28 MED ORDER — ENOXAPARIN SODIUM 40 MG/0.4ML ~~LOC~~ SOLN: 40.0000 mg | SUBCUTANEOUS | Status: DC

## 2019-10-28 MED ORDER — CALCIUM CITRATE 950 (200 CA) MG PO TABS
200.0000 mg | ORAL_TABLET | Freq: Every day | ORAL | Status: DC
  Administered 2019-10-29 – 2019-11-10 (×12): 200 mg via ORAL
  Filled 2019-10-28 (×13): qty 1

## 2019-10-28 MED ORDER — PANTOPRAZOLE SODIUM 40 MG PO TBEC
40.0000 mg | DELAYED_RELEASE_TABLET | Freq: Every evening | ORAL | Status: DC
  Administered 2019-10-28 – 2019-11-09 (×13): 40 mg via ORAL
  Filled 2019-10-28 (×13): qty 1

## 2019-10-28 MED ORDER — FLUTICASONE FUROATE-VILANTEROL 200-25 MCG/INH IN AEPB
1.0000 | INHALATION_SPRAY | Freq: Every day | RESPIRATORY_TRACT | Status: DC
  Administered 2019-10-29 – 2019-11-10 (×11): 1 via RESPIRATORY_TRACT
  Filled 2019-10-28: qty 28

## 2019-10-28 MED ORDER — BACLOFEN 10 MG PO TABS
30.0000 mg | ORAL_TABLET | Freq: Three times a day (TID) | ORAL | Status: DC
  Administered 2019-10-29 – 2019-11-10 (×36): 30 mg via ORAL
  Filled 2019-10-28 (×37): qty 3

## 2019-10-28 MED ORDER — ADULT MULTIVITAMIN W/MINERALS CH
1.0000 | ORAL_TABLET | Freq: Every day | ORAL | Status: DC
  Administered 2019-10-29 – 2019-11-10 (×11): 1 via ORAL
  Filled 2019-10-28 (×13): qty 1

## 2019-10-28 MED ORDER — SIMVASTATIN 20 MG PO TABS
40.0000 mg | ORAL_TABLET | Freq: Every day | ORAL | Status: DC
  Administered 2019-10-29: 40 mg via ORAL
  Filled 2019-10-28: qty 2

## 2019-10-28 MED ORDER — ENOXAPARIN SODIUM 40 MG/0.4ML ~~LOC~~ SOLN
40.0000 mg | SUBCUTANEOUS | Status: DC
  Administered 2019-10-29 – 2019-11-02 (×5): 40 mg via SUBCUTANEOUS
  Filled 2019-10-28 (×6): qty 0.4

## 2019-10-28 MED ORDER — SUMATRIPTAN SUCCINATE 50 MG PO TABS
50.0000 mg | ORAL_TABLET | ORAL | Status: DC | PRN
  Administered 2019-11-03 – 2019-11-06 (×2): 50 mg via ORAL
  Filled 2019-10-28 (×3): qty 1

## 2019-10-28 MED ORDER — COLLAGENASE 250 UNIT/GM EX OINT
TOPICAL_OINTMENT | Freq: Every day | CUTANEOUS | Status: DC
  Filled 2019-10-28: qty 30

## 2019-10-28 MED ORDER — TRAZODONE HCL 50 MG PO TABS
50.0000 mg | ORAL_TABLET | Freq: Every evening | ORAL | Status: DC | PRN
  Administered 2019-10-30: 50 mg via ORAL
  Filled 2019-10-28 (×3): qty 1

## 2019-10-28 MED ORDER — SODIUM CHLORIDE 0.9 % IV SOLN
2.0000 g | Freq: Three times a day (TID) | INTRAVENOUS | Status: DC
  Administered 2019-10-28 – 2019-10-30 (×5): 2 g via INTRAVENOUS
  Filled 2019-10-28 (×7): qty 2

## 2019-10-28 MED ORDER — LEVOTHYROXINE SODIUM 25 MCG PO TABS
125.0000 ug | ORAL_TABLET | Freq: Every day | ORAL | Status: DC
  Administered 2019-10-29 – 2019-11-03 (×6): 125 ug via ORAL
  Filled 2019-10-28 (×6): qty 1

## 2019-10-28 MED ORDER — GABAPENTIN 100 MG PO CAPS
100.0000 mg | ORAL_CAPSULE | Freq: Every day | ORAL | Status: DC
  Administered 2019-10-28 – 2019-11-09 (×13): 100 mg via ORAL
  Filled 2019-10-28 (×13): qty 1

## 2019-10-28 MED ORDER — HYDROCODONE-ACETAMINOPHEN 5-325 MG PO TABS
1.0000 | ORAL_TABLET | Freq: Four times a day (QID) | ORAL | Status: DC | PRN
  Administered 2019-10-29 – 2019-11-08 (×19): 1 via ORAL
  Filled 2019-10-28 (×19): qty 1

## 2019-10-28 MED ORDER — BACLOFEN 10 MG PO TABS
40.0000 mg | ORAL_TABLET | Freq: Every day | ORAL | Status: DC
  Administered 2019-10-28 – 2019-11-09 (×13): 40 mg via ORAL
  Filled 2019-10-28 (×12): qty 4

## 2019-10-28 MED ORDER — SODIUM CHLORIDE 0.9% FLUSH
3.0000 mL | Freq: Two times a day (BID) | INTRAVENOUS | Status: DC
  Administered 2019-10-28 – 2019-11-10 (×23): 3 mL via INTRAVENOUS

## 2019-10-28 MED ORDER — POLYETHYLENE GLYCOL 3350 17 G PO PACK
17.0000 g | PACK | Freq: Two times a day (BID) | ORAL | Status: DC
  Administered 2019-10-28 – 2019-10-29 (×3): 17 g via ORAL
  Filled 2019-10-28 (×3): qty 1

## 2019-10-28 MED ORDER — ONDANSETRON HCL 4 MG/2ML IJ SOLN
4.0000 mg | Freq: Four times a day (QID) | INTRAMUSCULAR | Status: DC | PRN
  Administered 2019-11-04: 4 mg via INTRAVENOUS
  Filled 2019-10-28: qty 2

## 2019-10-28 MED ORDER — ASPIRIN EC 81 MG PO TBEC
81.0000 mg | DELAYED_RELEASE_TABLET | Freq: Every day | ORAL | Status: DC
  Administered 2019-10-29 – 2019-11-10 (×12): 81 mg via ORAL
  Filled 2019-10-28 (×13): qty 1

## 2019-10-28 MED ORDER — GUAIFENESIN ER 600 MG PO TB12
1200.0000 mg | ORAL_TABLET | Freq: Two times a day (BID) | ORAL | Status: DC
  Administered 2019-10-28 – 2019-11-05 (×14): 1200 mg via ORAL
  Administered 2019-11-05: 600 mg via ORAL
  Administered 2019-11-06 – 2019-11-10 (×9): 1200 mg via ORAL
  Filled 2019-10-28 (×25): qty 2

## 2019-10-28 MED ORDER — INSULIN ASPART 100 UNIT/ML ~~LOC~~ SOLN: 0.0000 [IU] | Freq: Three times a day (TID) | SUBCUTANEOUS | Status: DC

## 2019-10-28 MED ORDER — ONDANSETRON HCL 4 MG PO TABS: 4.0000 mg | ORAL_TABLET | Freq: Four times a day (QID) | ORAL | Status: DC | PRN

## 2019-10-28 MED ORDER — IPRATROPIUM-ALBUTEROL 0.5-2.5 (3) MG/3ML IN SOLN: 3.0000 mL | RESPIRATORY_TRACT | Status: DC | PRN

## 2019-10-28 MED ORDER — TIZANIDINE HCL 2 MG PO TABS
2.0000 mg | ORAL_TABLET | Freq: Three times a day (TID) | ORAL | Status: DC
  Administered 2019-10-28 – 2019-11-10 (×35): 2 mg via ORAL
  Filled 2019-10-28 (×39): qty 1

## 2019-10-28 MED ORDER — NITROGLYCERIN 0.4 MG SL SUBL: 0.4000 mg | SUBLINGUAL_TABLET | SUBLINGUAL | Status: DC | PRN

## 2019-10-28 MED ORDER — VANCOMYCIN HCL 1250 MG/250ML IV SOLN
1250.0000 mg | Freq: Two times a day (BID) | INTRAVENOUS | Status: DC
  Administered 2019-10-29 – 2019-10-30 (×4): 1250 mg via INTRAVENOUS
  Filled 2019-10-28 (×4): qty 250

## 2019-10-28 NOTE — Progress Notes (Addendum)
[place] PHYSICAL MEDICINE & REHABILITATION PROGRESS NOTE    Feels somewhat less SOB.  Spasms also somewhat better this AM- feels like they're less often.  Feels like they aren't turning him as often, but admits likes to stay supine.   Wondering when could go to OR for surgery.     ROS:   Pt denies SOB, abd pain, CP, N/V/C/D, and vision changes SOB somewhat improved this AM   Objective:   DG CHEST PORT 1 VIEW  Result Date: 10/27/2019 CLINICAL DATA:  Increasing shortness of breath. EXAM: PORTABLE CHEST 1 VIEW COMPARISON:  Full-thickness ray dated Oct 24, 2019. FINDINGS: Stable cardiomediastinal silhouette. Normal pulmonary vascularity. Unchanged small to moderate left pleural effusion with left basilar atelectasis. No pneumothorax. Unchanged elevation of left hemidiaphragm. No acute osseous abnormality. IMPRESSION: 1. Unchanged left pleural effusion with associated basilar atelectasis. Electronically Signed   By: Titus Dubin M.D.   On: 10/27/2019 10:32   Recent Labs    10/27/19 0500 10/28/19 0422  WBC 15.5* 15.1*  HGB 11.5* 11.7*  HCT 36.3* 37.6*  PLT 313 306   Recent Labs    10/27/19 0500 10/28/19 0422  NA 141 142  K 3.8 3.6  CL 99 99  CO2 31 31  GLUCOSE 104* 97  BUN 10 13  CREATININE 0.73 0.74  CALCIUM 8.8* 9.0    Intake/Output Summary (Last 24 hours) at 10/28/2019 0944 Last data filed at 10/28/2019 0546 Gross per 24 hour  Intake 918 ml  Output 2850 ml  Net -1932 ml     Physical Exam: Vital Signs Blood pressure (!) 151/83, pulse 74, temperature 98 F (36.7 C), resp. rate 18, height 5\' 7"  (1.702 m), weight 95.2 kg, SpO2 94 %. Constitutional: awake, on 3.5L O2 by [place], initially asleep, but woke well, NAD HEENT: O2 by [place]; conjugate gaze Neck: supple Cardiovascular: RRR   Respiratory/Chest: less accessory muscle use; less coarse, less wheezing; more comfortable, better air movement- didn't need CPAP while awake GI/Abdomen:Soft, NT, ND, (+)BS  Ext: no  clubbing, cyanosis, or edema Skin: buttock and coccyx--wound with slough. Took picture.  Psych:less anxious Musc: No edema in extremities.  No tenderness in extremities. Neurologic:significant tone 3/4 bilateral HAD's, hamstrings---very tender with any attempted ROM  Motor:  Left lower extremity: 2/5 proximal distal, unchanged Right lower extremity: 1/5 proximal distal- LE exam unchanged.  Spasms less frequently-~ 1 minutes vs every 20 seconds.     Assessment/Plan: 1. Functional deficits secondary to lumbar spinal stenosis with neurogenic claudication which require 3+ hours per day of interdisciplinary therapy in a comprehensive inpatient rehab setting.  Physiatrist is providing close team supervision and 24 hour management of active medical problems listed below.  Physiatrist and rehab team continue to assess barriers to discharge/monitor patient progress toward functional and medical goals  Care Tool:  Bathing    Body parts bathed by patient: Chest, Abdomen, Face   Body parts bathed by helper: Right arm, Left arm, Front perineal area, Buttocks, Right lower leg, Left lower leg     Bathing assist Assist Level: Maximal Assistance - Patient 24 - 49%     Upper Body Dressing/Undressing Upper body dressing   What is the patient wearing?: Pull over shirt    Upper body assist Assist Level: Total Assistance - Patient < 25%    Lower Body Dressing/Undressing Lower body dressing      What is the patient wearing?: Incontinence brief, Pants     Lower body assist Assist for lower body dressing:  Dependent - Patient 0%     Toileting Toileting    Toileting assist Assist for toileting: Dependent - Patient 0%     Transfers Chair/bed transfer  Transfers assist     Chair/bed transfer assist level: Dependent - mechanical lift     Locomotion Ambulation   Ambulation assist   Ambulation activity did not occur: Safety/medical concerns  Assist level: 2 helpers Assistive  device: Parallel bars Max distance: 5'   Walk 10 feet activity   Assist  Walk 10 feet activity did not occur: Safety/medical concerns        Walk 50 feet activity   Assist Walk 50 feet with 2 turns activity did not occur: Safety/medical concerns         Walk 150 feet activity   Assist Walk 150 feet activity did not occur: Safety/medical concerns         Walk 10 feet on uneven surface  activity   Assist Walk 10 feet on uneven surfaces activity did not occur: Safety/medical concerns         Wheelchair     Assist Will patient use wheelchair at discharge?: Yes Type of Wheelchair: Manual    Wheelchair assist level: Supervision/Verbal cueing Max wheelchair distance: 11'    Wheelchair 50 feet with 2 turns activity    Assist        Assist Level: Supervision/Verbal cueing   Wheelchair 150 feet activity     Assist      Assist Level: Supervision/Verbal cueing   Blood pressure (!) 151/83, pulse 74, temperature 98 F (36.7 C), resp. rate 18, height 5\' 7"  (1.702 m), weight 95.2 kg, SpO2 94 %.    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  Per neurosurgery, follow-up with outpatient after  NCS/EMG.  Neurology consulted recommended management of spasticity and spasms with outpatient NCS/EMG  5/3- Asked Neuro to come back for recs- but  they recommended was what we discussed, a ITB pump, which cannot be placed inpt. Didn't address NSU note at all.   5/4- will get MRI of cervical spine- since cannot find anything else to focus on-  5/5- spoke with NSU and Neurology- decided on doing CT myelogram of entire spine-ordered and to go this AM  5/6- will call Dr Zada Finders about ITB pump and CT myelogram plan-   5/9- going to OR Monday- for   L3-4 decompression  5/10- hold OR time- since likely has pneumonia again.     Continue CIR as able 2. Antithrombotics: -DVT/anticoagulation:Lovenox.   4/12- Dopplers (-)- con't lovenox -antiplatelet therapy: Aspirin 81 mg daily 3. Pain Management:Neurontin 100 mg nightly, OxyContin 10 mg every 12 hours, hydrocodone as needed as well as Imitrex for headaches  baclofen to 15 mg TID-  with meals and 20 mg QHS, increase nighttime dose to 30mg  on 5/1 with improvement in resting spasms  Tizanidine 2 mg 3 times daily  5/3- changed breakfast time Baclofen to 0600 since spasms so bad in AM-   5/4- increase baclofen to 20 mg TID and 30 mg QHS  5/5- spasms no better-   5/6- increased baclofen a lot to 30 mg TID and 40 mg QHS- so on 130 mg/day- can't increase much more.  5/8- tone still severe. Increased tizanidine to 4mg  TID  4. Mood:Provide emotional support -antipsychotic agents: N/A 5. Neuropsych: This patientiscapable of making decisions on hisown behalf. 6. Skin/Wound Care:place steristrips over right index finger lac.                          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  Foam dressing left knee  Barrier cream MASD on sacrum  5/3-  using Prevalon boots  5/6- will call WOC to help- also on air bed.   5/7- got CT of L ankle ot look for osteomyelitis- was (-) but wound care make recs- changed orders. Air bed and DTI of L/centrla buttcok  5/8 continue above plan 7. Fluids/Electrolytes/Nutrition:Routine in and outs  Calcium 8.5 on 4/26-started calcium supplement, labs ordered for tomorrow 8. Chronic restless leg syndrome. Sinemet 10-100 mg twice daily.  See #3 9. 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 OSA with CPAP/2 L chronic oxygen at home. Continue inhalers 11. 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 Follow-up outpatient 12. Diabetes mellitus. Hemoglobin A1c 5.8. SSI. Changed to [facility] for CBG and SSI  HbA1c 5.8- 13. Neurogenic bowel and bladder. Check PVR  Incontinent BMs 14. Hypothyroidism.  Continue Synthroid 15. Chronic diastolic congestive heart failure. Monitor for any signs of fluid overload. Check regular weights   Filed Weights   10/22/19 0506 10/26/19 0518 10/27/19 0315  Weight: 92.5 kg 95.3 kg 95.2 kg   5/4- up to 98.7 kg- up 3 kg in a 2 days- will monitor  5/8-9 weights inconsistent  5/10- gave Lasix 40 mg IV x1- since more SOB this AM  5/11- weight stable 16. Hyperlipidemia. Zocor 17. Code status: Changed to DNR as per patient's wishes. 18. Leukocytosis - pneumonia- Resolved 19. Wheezing  Resolved on abx, scheduled umeclidinium  4/28- pulmonary saw pt- maximized meds  5/4- improved this AM- feeling SOB when has spasms- thinks it affects diaphragm- can see that 20 Spasticity vs tone in hamstrings, chronically WC bound may be contracted  See #3 21. Neurogenic bladder  4/28-started Flomax 0.4 mg daily and place foley per wishes of pt.   5/3- will increase flomax to 0.8 mg to try and see if will void once foley try to remove.   5/9 low grade temp  recorded x1. Observe for now 22.  Labile blood pressure  Monitor for trend 23/ Leukocytosis  5/10- added Vanc and Cefepime- and ordered CXR.   5/11- CXR shows L small-moderate effusion- no pneumonia- not sure cause- will check U/A and Cx- on Cefepime and Vanc- esp since turned MRSA (+) this weekend. Will see if possible to transfer to acute since plan for surgery soon.   Have  called IM- however there are no bed in hospital to transfer a pt that doesn't need ICU to- so will con't to address transfer with IM as bed situation changes.    LOS: 32 days A FACE TO FACE EVALUATION WAS PERFORMED  Shantrice Rodenberg 10/28/2019, 9:44 AM

## 2019-10-28 NOTE — Patient Care Conference (Signed)
Inpatient RehabilitationTeam Conference and Plan of Care Update Date: 10/28/2019   Time: 1:48 PM    Patient Name: [name]      Medical Record Number: [id number]  Date of Birth: 1941-03-15 Sex: Male         Room/Bed: 4M07C/4M07C-01 Payor Info: Payor: [employer] / Plan: [employer] MEDICARE / Product Type: *No Product type* /    Admit Date/Time:  09/26/2019  1:13 PM  Primary Diagnosis:  Neurogenic claudication due to lumbar spinal stenosis  Patient Active Problem List   Diagnosis Date Noted  . Labile blood pressure   . Spasticity   . Muscle spasm   . Hypoxia   . Supplemental oxygen dependent   . Neurogenic bladder   . Chronic diastolic congestive heart failure ([place])   . Neurogenic bowel   . Hypocalcemia   . Spinal stenosis of lumbar region   . Cervical myelopathy ([place]) 09/26/2019  . Diabetes mellitus type 2, controlled, with complications ([place]) [id number]  . Obese 09/17/2019  . Thoracic aortic aneurysm ([place]) 09/17/2019  . Bowel incontinence 09/17/2019  . Bladder incontinence 09/17/2019  . Right hemiparesis ([place]) 09/17/2019  . Acute renal failure (ARF) ([place]) 09/17/2019  . Left hemiparesis ([place]) 09/17/2019  . Chronic respiratory failure with hypoxia ([place]) 09/17/2019  . Syncope and collapse 09/17/2019  . Chronic diastolic CHF (congestive heart failure) ([place]) 09/04/2019  . PVD (peripheral vascular disease) ([place]) 02/14/2019  . Essential hypertension 02/13/2019  . Non-insulin treated type 2 diabetes mellitus ([place]) 02/13/2019  . Severe sepsis ([place]) 01/17/2019  . Community acquired pneumonia of right lower lobe of lung 01/17/2019  . Postural urinary incontinence 05/20/2018  . Myelopathy concurrent with and due to spinal stenosis of cervical region ([place]) 05/20/2018  . Myoclonic jerking 05/20/2018  . 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 History of respiratory failure 08/18/2017  . Hypokalemia 08/18/2017  . Leukocytosis 08/18/2017   . Muscle atrophy of lower extremity 08/09/2017  . Bowel and bladder incontinence 08/09/2017  . Right-sided muscle weakness 08/09/2017  . Neuropathy 08/09/2017  . 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/2017  . Abnormality of gait 07/12/2017  . Myoclonic jerkings, 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/2017  . Acquired right foot drop 07/12/2017  . UTI (urinary tract infection) due to Enterococcus 07/12/2017  . Pressure ulcer 06/21/2017  . Acute cystitis 06/20/2017  . Generalized weakness 06/20/2017  . Dehydration 06/20/2017  . Dyspnea 12/26/2016  . Chronic cough 12/26/2016  . Hypersomnia with sleep apnea 10/30/2016  . CSA (central sleep apnea) 10/30/2016  . Wheezing symptom 10/30/2016  . 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 RLS (restless legs syndrome) 08/25/2014  . Primary gout 08/25/2014  . OSA on CPAP 08/25/2014  . Severe obesity (BMI >= 40) ([place]) 08/25/2014  . Obesity (BMI 30-39.9) 02/18/2013  . 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 S/P TKR (total knee replacement) 11/29/2012  . History of thyroid cancer     Expected Discharge Date: Expected Discharge Date: (medical hold)  Team Members Present: Physician leading conference: Dr. Courtney Heys Care Coodinator Present: Loralee Pacas, LCSWA;Christina Sampson Goon, BSW Nurse Present: Dorthula Nettles, RN;Ed Heathcote, RN PT Present: Excell Seltzer, PT OT Present: Roanna Epley, COTA SLP Present: Weston Anna, SLP PPS Coordinator present : Ileana Ladd, Burna Mortimer, SLP     Current Status/Progress Goal Weekly Team Focus  Bowel/Bladder   Pt has foley and incontient of bowel.  Patient to become more continent  Assess bowel,  and foley q shift and prn   Swallow/Nutrition/ Hydration             ADL's   max/tot A overall; Maxi Move for transfers  LTG downgraded: bathing-mod A; UB dressing-min A; LB dressing-max A; toilet transfers-max A; therapy frequency changed  to QD  family education, activity tolerance, sitting balance   Mobility   max A to +2 bed mobility, transfers via maxi move, Supervision w/c mobility up to 50 ft  will downgrade to dependent for transfers via hoyer  endurance, sitting balance, therapy tolerance   Communication             Safety/Cognition/ Behavioral Observations            Pain   Pt has pain level 8/10 in left ankle; gets scheduled meds.  To bring pain level below 2/10.  Assess pain q shift or prn and give prn meds as needed.   Skin   Patient has a wound on left ankle (foam on), abrasion to right knee, ecchymosis to bilateral arms, MASD to groin and buttocks, pressure injury to coxxyx (foam on).  Prevent further skin breakdown and promote healing  Assess skin q shift and prn.    Rehab Goals Rehab Goals Revised: Pt d/c date is currently on hold due to medical reasons *See Care Plan and progress notes for long and short-term goals.     Barriers to Discharge  Current Status/Progress Possible Resolutions Date Resolved   Nursing                  PT  Medical stability                 OT                  SLP                SW Decreased caregiver support;Lack of/limited family support              Discharge Planning/Teaching Needs:  Pt to d/c to home with his wife who will provide 24/7 care; aide service- 3hrs in the AM/PM  Family education as recommended by therapy   Team Discussion: Therapy to begin again 10/29/19. No beds available on acute side of hospital. Surgery on hold due to elevated WBC.   Revisions to Treatment Plan: Patient to remain q daily with therapy.     Medical Summary Current Status: time sense not good- incontnent of bowel; foley; hasn't asked for pain meds today; Weekly Focus/Goal: about the same- +2 for transfers; maximove for transfers  Barriers to Discharge: Behavior;Incontinence;Neurogenic Bowel & Bladder;Medical stability;Pending Surgery;Wound care;Weight bearing restrictions    Possible Resolutions to Barriers: no change in OT-   Continued Need for Acute Rehabilitation Level of Care: The patient requires daily medical management by a physician with specialized training in physical medicine and rehabilitation for the following reasons: Direction of a multidisciplinary physical rehabilitation program to maximize functional independence : Yes Medical management of patient stability for increased activity during participation in an intensive rehabilitation regime.: Yes Analysis of laboratory values and/or radiology reports with any subsequent need for medication adjustment and/or medical intervention. : Yes   I attest that I was present, lead the team conference, and concur with the assessment and plan of the team.   Cristi Loron 10/28/2019, 1:48 PM

## 2019-10-28 NOTE — Progress Notes (Signed)
Patient transferred to 2W20 [facility].

## 2019-10-28 NOTE — Progress Notes (Signed)
Physical Therapy Session Note  Patient Details  Name: LORAIN WHORTON MRN: [id number] Date of Birth: [birth year]  Today's Date: 10/28/2019 PT Individual Time: 0930-1000 PT Individual Time Calculation (min): 30 min   Short Term Goals: Week 4:  PT Short Term Goal 1 (Week 4): Pt will complete bed mobility with assist x 1 PT Short Term Goal 2 (Week 4): Pt will complete least restrictive transfer with assist x 1 PT Short Term Goal 3 (Week 4): Pt will perform w/c mobility x 100 ft at Supervision level  Skilled Therapeutic Interventions/Progress Updates:   Pt presents in bed and agreeable to therapy session stating he was not having SOB this morning and overall more alert. Pt's TIS w/c was no longer in the room. PT retrieved a new one with pressure relieving cushion in preparation for OOB. Started to don tedhose to prepare for OOB but noted L ankle wound open and draining. Notified RN for dressing change needed. Deferred OOB until this could be addressed as well as donning of pants. Focused on supine BLE stretching for increasing ROM due to spasms and tone in all available planes at hip/knee/ankle to tolerance. All needs in reach.   Therapy Documentation Precautions:  Precautions Precautions: Fall Restrictions Weight Bearing Restrictions: No Pain:  did not rate pain. Reports pain in BLE from spasms. Premedicated.     Therapy/Group: Individual Therapy  Canary Brim Ivory Broad, PT, DPT, CBIS  10/28/2019, 10:56 AM

## 2019-10-28 NOTE — H&P (Signed)
History and Physical    [name] [id number] DOB: Jun 06, 1941 DOA: (Not on file)  Referring MD/NP/PA: Hope Budds PCP: [name], MD  Patient coming from: Inpatient rehab transfer  Chief Complaint: Weakness  I have personally briefly reviewed patient's old medical records in [place]   HPI: [name] is a 79 y.o. 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  He was admitted for generalized fatigue and inability to ambulate on 3/31.  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  Images were discussed with Dr. Ellene Route who recommended medical management given he was a poor surgical candidate with IV steroids to be transitioned to a prolonged steroid tape.  The patient symptoms did not completely resolve and he continued having issues with walking.  Inpatient rehab was recommended for which patient was transferred to inpatient rehab on 4/9.  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  It appears neurosurgery was consulted in regards to these findings for consideration for possible baclofen pump, but recommended neurology consult for recommendations and management of spasticity and perhaps a outpatient EMG.  Myelogram completed showing severe degeneration with mild scoliosis,  listhesis,  L3-4 severe spinal stenosis, L2-3 moderate to advanced spinal stenosis, and  L1-2 moderate spinal stenosis.  Plan was for  possible L3-L4 decompression with Dr. Mellody Drown.  ED Course: as seen above.   Review of Systems  Constitutional: Negative for fever.  HENT: Negative for ear pain.   Eyes: Negative for pain.  Respiratory: Positive for cough and shortness of breath.   Cardiovascular: Negative for chest pain and PND.  Gastrointestinal: Negative for abdominal pain, diarrhea and vomiting.  Genitourinary: Negative for dysuria and hematuria.  Musculoskeletal: Positive for back pain and myalgias.  Skin: Negative for rash.  Neurological: Positive for tingling, sensory change and weakness. Negative for speech change.  Psychiatric/Behavioral: Negative for substance abuse.    Past Medical History:  Diagnosis Date  . Arthritis   . Barrett esophagus   . Bronchitis   . Carotid artery occlusion   . COPD (chronic obstructive pulmonary disease) ([place])   . Diabetes mellitus without complication ([place])   . GERD (gastroesophageal reflux disease)   . Headache    migraines  . History of thyroid cancer 2008  . Hypertension   . Restless leg   . Sleep apnea with use of continuous positive airway pressure (CPAP)    2011 [facility] , [facility]  77cn central and obstrcutive. 16 cm water , 3 cm EPR.   . Thoracic ascending aortic aneurysm ([facility])    4.2 cm ascending TAA 08/18/17 CTA. Annual imaging recommended.  . thyroid ca dx'd 2009 or 2010   surg and radioactive isoptope  . Ulcer    Peptic ulcer disease    Past Surgical History:  Procedure Laterality Date  . ANTERIOR CERVICAL DECOMP/DISCECTOMY FUSION N/A 09/03/2017   Procedure: ACDF - C3-C4 - C4-C5 - C5-C6;  Surgeon: Kary Kos, MD;  Location: [facility];  Service: Neurosurgery;  Laterality:  N/A;  . CAROTID ENDARTERECTOMY Left January 03, 2006   Dr. Amedeo Plenty  . COLONOSCOPY    . EYE SURGERY Bilateral    cataract surgery with lens implants  . game keepers thumb Right   . JOINT REPLACEMENT Left    knee X 2  . RADIOLOGY WITH ANESTHESIA N/A 10/16/2019   Procedure: MRI THORACIC  LUMBAR SPINE;  Surgeon: Radiologist, Medication, MD;  Location: [facility];  Service: Radiology;  Laterality: N/A;  . ROTATOR CUFF REPAIR Right   . THYROIDECTOMY  2008     reports that he quit smoking about 14 years ago. His smoking use included cigars and cigarettes. He quit after 40.00 years of use. He has never used smokeless tobacco. He reports that he does not drink alcohol or use drugs.  No Known Allergies  Family History  Problem Relation Age of Onset  . COPD Mother   . Hyperlipidemia Mother   . Hypertension Mother   . Diabetes Father   . Kidney disease Father        ESRD  . Hypertension Father   . Cancer Father   . Hyperlipidemia Father     Prior to Admission medications   Medication Sig Start Date End Date Taking? Authorizing Provider  acetaminophen (TYLENOL) 325 MG tablet Take 2 tablets (650 mg total) by mouth every 4 (four) hours as needed for mild pain ((score 1 to 3) or temp > 100.5). 09/06/17   Kary Kos, MD  acetaminophen (TYLENOL) 325 MG tablet Take 2 tablets (650 mg total) by mouth every 6 (six) hours as needed (pain or temperature). 10/24/19   Angiulli, Lavon Paganini, PA-C  albuterol (PROVENTIL) (2.5 MG/3ML) 0.083% nebulizer solution Inhale 3 mLs (2.5 mg total) into the lungs every 4 (four) hours as needed for wheezing or shortness of breath. 10/24/19   Angiulli, Lavon Paganini, PA-C  allopurinol (ZYLOPRIM) 300 MG tablet Take 300 mg by mouth daily.  06/02/15   [provider]  aspirin EC 81 MG EC tablet Take 1 tablet (81 mg total) by mouth daily. 10/24/19   Angiulli, Lavon Paganini, PA-C  baclofen (LIORESAL) 10 MG tablet TAKE 1 TABLET BY MOUTH 3 TIMES DAILY. Patient taking differently: Take 10 mg by mouth 3 (three) times daily.  06/23/19   Ward Givens, NP  baclofen (LIORESAL) 20 MG tablet Take 2 tablets (40 mg total) by mouth at bedtime. 10/24/19   Angiulli, Lavon Paganini, PA-C  calcium citrate (CALCITRATE - DOSED IN MG ELEMENTAL CALCIUM) 950 (200 Ca) MG tablet Take 1 tablet (200 mg of  elemental calcium total) by mouth daily. 10/24/19   Angiulli, Lavon Paganini, PA-C  calcium citrate-vitamin D (CITRACAL+D) 315-200 MG-UNIT per tablet Take 1 tablet by mouth daily. 750 mg     [provider]  carbidopa-levodopa (SINEMET IR) 10-100 MG tablet Take 1 tablet by mouth 2 (two) times daily.     [provider]  cholecalciferol (VITAMIN D) 1000 units tablet Take 1,000 Units by mouth daily.     [provider]  colchicine 0.6 MG tablet Take 0.6 mg by mouth daily.    [provider]  collagenase (SANTYL) ointment Apply topically daily. 10/24/19   Angiulli, Lavon Paganini, PA-C  enoxaparin (LOVENOX) 40 MG/0.4ML injection Inject 0.4 mLs (40 mg total) into the skin daily. 10/24/19   Angiulli, Lavon Paganini, PA-C  fluticasone furoate-vilanterol (BREO ELLIPTA) 200-25 MCG/INH AEPB Inhale 1 puff into the lungs daily. 10/24/19   Angiulli, Lavon Paganini, PA-C  furosemide (LASIX) 40 MG tablet  Take 1 tablet (40 mg total) by mouth daily. 04/18/19 09/18/19  Minus Breeding, MD  gabapentin (NEURONTIN) 100 MG capsule Take 1 capsule (100 mg total) by mouth at bedtime. 09/16/19   Ward Givens, NP  guaifenesin (HUMIBID E) 400 MG TABS tablet Take 400 mg by mouth daily.     [provider]  guaiFENesin (MUCINEX) 600 MG 12 hr tablet Take 2 tablets (1,200 mg total) by mouth 2 (two) times daily. 10/24/19   Angiulli, Lavon Paganini, PA-C  ipratropium-albuterol (DUONEB) 0.5-2.5 (3) MG/3ML SOLN Take 3 mLs by nebulization every 6 (six) hours as needed (cough).    [provider]  latanoprost (XALATAN) 0.005 % ophthalmic solution Place 1 drop into both eyes at bedtime. 10/24/19   Angiulli, Lavon Paganini, PA-C  levETIRAcetam (KEPPRA) 250 MG tablet Take 250 mg by mouth 2 (two) times daily. 08/15/19   [provider]  levothyroxine (SYNTHROID) 175 MCG tablet Take 125 mcg by mouth daily before breakfast.     [provider]  metFORMIN (GLUCOPHAGE-XR) 500 MG 24 hr tablet Take 500 mg by mouth 2 (two)  times daily.  01/07/19   [provider]  metoprolol succinate (TOPROL-XL) 50 MG 24 hr tablet Take 1 tablet (50 mg total) by mouth daily. Take with or immediately following a meal. 02/17/19 09/18/19  Minus Breeding, MD  Multiple Vitamin (MULTIVITAMIN) tablet Take 1 tablet by mouth daily.    [provider]  nitroGLYCERIN (NITROSTAT) 0.4 MG SL tablet Place 0.4 mg every 5 (five) minutes as needed under the tongue for chest pain.    [provider]  oxyCODONE (OXYCONTIN) 10 mg 12 hr tablet Take 1 tablet (10 mg total) by mouth every 12 (twelve) hours for 5 days. 10/24/19 10/29/19  Angiulli, Lavon Paganini, PA-C  oxymetazoline (AFRIN) 0.05 % nasal spray Place 2 sprays into the nose 2 (two) times daily as needed for congestion.     [provider]  pantoprazole (PROTONIX) 40 MG tablet Take 40 mg by mouth every evening.    [provider]  polyethylene glycol powder (MIRALAX) 17 GM/SCOOP powder Take by mouth as directed. 17 gm every other day     [provider]  rizatriptan (MAXALT) 10 MG tablet Take 10 mg by mouth as needed for migraine.  08/08/18   [provider]  simvastatin (ZOCOR) 40 MG tablet Take 40 mg by mouth daily at 6 PM.  08/07/17   [provider]  spironolactone (ALDACTONE) 25 MG tablet Take 25 mg by mouth daily.  02/07/19   [provider]  SUMAtriptan (IMITREX) 50 MG tablet Take 1 tablet (50 mg total) by mouth every 2 (two) hours as needed for migraine. May repeat in 2 hours if headache persists or recurs. 10/24/19   Angiulli, Lavon Paganini, PA-C  tamsulosin (FLOMAX) 0.4 MG CAPS capsule Take 2 capsules (0.8 mg total) by mouth daily after supper. 10/24/19   Angiulli, Lavon Paganini, PA-C  tiZANidine (ZANAFLEX) 2 MG tablet Take 1 tablet (2 mg total) by mouth 3 (three) times daily. 10/24/19   Angiulli, Lavon Paganini, PA-C  TRAVATAN Z 0.004 % SOLN ophthalmic solution Place 1 drop into both eyes at bedtime.  07/25/13   [provider]  traZODone  (DESYREL) 50 MG tablet Take 1 tablet (50 mg total) by mouth at bedtime as needed for sleep. 10/24/19   Angiulli, Lavon Paganini, PA-C  TRELEGY ELLIPTA 100-62.5-25 MCG/INH AEPB Inhale 1 puff into the lungs daily.  05/01/18   [provider]  triamcinolone cream (KENALOG) 0.5 % Apply 1 application topically daily as needed for rash. 09/09/19   [provider]  umeclidinium bromide (INCRUSE ELLIPTA) 62.5 MCG/INH AEPB Inhale 1 puff into the lungs daily. 10/24/19   Angiulli, Lavon Paganini, PA-C  VENTOLIN HFA 108 (90 Base) MCG/ACT inhaler Inhale 1-2 puffs into the lungs every 4 (four) hours as needed for wheezing or shortness of breath.  07/17/18   [provider]    Physical Exam:  Constitutional: elderly male in NAD, calm, comfortable There were no vitals filed for this visit. Eyes: PERRL, lids and conjunctivae normal ENMT: Mucous membranes are moist. Posterior pharynx clear of any exudate or lesions.Normal dentition.  Neck: normal, supple, no masses, no thyromegaly Respiratory: clear to auscultation bilaterally, no wheezing, no crackles. Normal respiratory effort. No accessory muscle use.  Cardiovascular: Regular rate and rhythm, no murmurs / rubs / gallops. No extremity edema. 2+ pedal pulses. No carotid bruits.  Abdomen: no tenderness, no masses palpated. No hepatosplenomegaly. Bowel sounds positive.  Musculoskeletal: no clubbing / cyanosis. Muscle  Spasms appreciated Skin:wound of buttocks present Neurologic: CN 2-12 grossly intact. RLE 2/5 strength and LLE 1/5  Psychiatric: Normal judgment and insight. Alert and oriented x 3. Normal mood.     Labs on Admission: I have personally reviewed following labs and imaging studies  CBC: Recent Labs  Lab 10/27/19 0500 10/28/19 0422  WBC 15.5* 15.1*  NEUTROABS 12.4* 11.5*  HGB 11.5* 11.7*  HCT 36.3* 37.6*  MCV 91.4 91.5  PLT 313 [id number]   Basic Metabolic Panel: Recent Labs  Lab 10/27/19 0500 10/28/19 0422  NA 141 142  K 3.8  3.6  CL 99 99  CO2 31 31  GLUCOSE 104* 97  BUN 10 13  CREATININE 0.73 0.74  CALCIUM 8.8* 9.0   GFR: Estimated Creatinine Clearance: 83.6 mL/min (by C-G formula based on SCr of 0.74 mg/dL). Liver Function Tests: No results for input(s): AST, ALT, ALKPHOS, BILITOT, PROT, ALBUMIN in the last 168 hours. No results for input(s): LIPASE, AMYLASE in the last 168 hours. No results for input(s): AMMONIA in the last 168 hours. Coagulation Profile: No results for input(s): INR, PROTIME in the last 168 hours. Cardiac Enzymes: No results for input(s): CKTOTAL, CKMB, CKMBINDEX, TROPONINI in the last 168 hours. BNP (last 3 results) Recent Labs    02/14/19 1001  PROBNP 482   HbA1C: No results for input(s): HGBA1C in the last 72 hours. CBG: Recent Labs  Lab 10/26/19 0217  GLUCAP 108*   Lipid Profile: No results for input(s): CHOL, HDL, LDLCALC, TRIG, CHOLHDL, LDLDIRECT in the last 72 hours. Thyroid Function Tests: No results for input(s): TSH, T4TOTAL, FREET4, T3FREE, THYROIDAB in the last 72 hours. Anemia Panel: No results for input(s): VITAMINB12, FOLATE, FERRITIN, TIBC, IRON, RETICCTPCT in the last 72 hours. Urine analysis:    Component Value Date/Time   COLORURINE YELLOW 10/28/2019 1330   APPEARANCEUR HAZY (A) 10/28/2019 1330   LABSPEC 1.029 10/28/2019 1330   PHURINE 5.0 10/28/2019 1330   GLUCOSEU NEGATIVE 10/28/2019 1330   HGBUR NEGATIVE 10/28/2019 1330   BILIRUBINUR NEGATIVE 10/28/2019 1330   KETONESUR 5 (A) 10/28/2019 1330   PROTEINUR 30 (A) 10/28/2019 1330   UROBILINOGEN 1.0 09/09/2010 1045   NITRITE NEGATIVE 10/28/2019 1330   LEUKOCYTESUR NEGATIVE 10/28/2019 1330   Sepsis Labs: Recent Results (from the past 240 hour(s))  Surgical PCR screen     Status: Abnormal   Collection Time: 10/26/19 10:00 AM   Specimen: Nasal Mucosa; Nasal Swab  Result Value Ref Range Status   MRSA, PCR POSITIVE (A) NEGATIVE Final    Comment: RESULT CALLED TO, READ BACK BY AND VERIFIED  WITH: RN SUSAN LEE 1247 10/26/19 KB    Staphylococcus aureus POSITIVE (A) NEGATIVE Final    Comment: RESULT CALLED TO, READ BACK BY AND VERIFIED WITH: RN SUSAN LEE 1247 10/26/19 KB (NOTE) The Xpert SA Assay (FDA approved for NASAL specimens in patients 54 years of age and older), is one component of a comprehensive surveillance program. It is not intended to diagnose infection nor to guide or monitor treatment.      Radiological Exams on Admission: DG CHEST PORT 1 VIEW  Result Date: 10/27/2019 CLINICAL DATA:  Increasing shortness of breath. EXAM: PORTABLE CHEST 1 VIEW COMPARISON:  Full-thickness ray dated Oct 24, 2019. FINDINGS: Stable cardiomediastinal silhouette. Normal pulmonary vascularity. Unchanged small to moderate left pleural effusion with left basilar atelectasis. No pneumothorax. Unchanged elevation of left hemidiaphragm. No acute osseous abnormality. IMPRESSION: 1. Unchanged left pleural effusion with associated basilar atelectasis. Electronically Signed   By: Titus Dubin M.D.   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 -Admit to MedSurg bed -Continue increased dose of baclofen 30 mg 3 times daily and 40 g nightly -PT/OT consult -Follow-up with neurosurgery in regards to possible surgery  Possible urinary tract infection infection: Patient noted to have elevated white blood cell count and there was initially concern for pneumonia.  Patient was placed on empiric antibiotics of vancomycin and cefepime, but chest x-ray unchanged.  Urinalysis was noted to be abnormal.  Previous urine cultures appeared to have grown greater than 100,000 colonies of Corynebacterium species -Follow-up urine cultures -Continue empiric antibiotics with vancomycin and cefepime -De-escalate when medically  appropriate  Chronic diastolic congestive heart failure: Patient does not appear to be acutely fluid overloaded at this time.  Weight had been noted to be possibly elevated up to 3 kg  and was given 40 mg of Lasix IV on 5/10 due to increased shortness of breath. -Strict intake and output -Daily weight  -Reassess for need of continued IV diuresis  Chronic respiratory failure with hypoxia: Patient currently on home 2 L of nasal cannula oxygen. -Continue nasal cannula oxygen 2 L -Continue Breo -Albuterol nebs as needed  Bladder/bowel incontinence: Chronic -Need to monitor postvoid residuals  Restless leg syndrome -Continue Sinemet  Myoclonic jerking -Continue Keppra   Diabetes mellitus type 2: Appears well controlled as last hemoglobin A1c noted to be 5.8. -Hypoglycemic protocol -CBGs before every meal with sensitive SSI  Hypothyroidism -Continue Synthroid  Thoracic ascending aortic aneurysm: Previously reported to be 4.2 cm by imaging from 2019. -Continue year monitoring in the outpatient setting  OSA -Continue CPAP per RT  CODE STATUS: DO NOT RESUSCITATE DVT prophylaxis: Lovenox Code Status: DNR Family Communication: None Disposition Plan: Likely discharge back to inpatient rehab once medically stable if surgery able to be performed. Consults called: none Admission status: Inpatient  Norval Morton MD [facility] Pager [phone]   If 7PM-7AM, please contact night-coverage [email or web address] Password [facility]  10/28/2019, 4:14 PM

## 2019-10-28 NOTE — Progress Notes (Signed)
Report called to M. Rolena Infante, RN on [facility]. Patient's wife notified of transfer.

## 2019-10-28 NOTE — Progress Notes (Signed)
Occupational Therapy Session Note  Patient Details  Name: [name] MRN: [id number] Date of Birth: 1940-10-26  Today's Date: 10/28/2019 OT Individual Time: 1130-1200 OT Individual Time Calculation (min): 30 min    Short Term Goals: Week 4:  OT Short Term Goal 1 (Week 4): STG=LTG secondary to ELOS OT Short Term Goal 1 - Progress (Week 4): Revised due to lack of progress  Skilled Therapeutic Interventions/Progress Updates:    Pt asleep in bed upon arrival but easily aroused.  Pt requesting to get OOB into TIS w/c. Dressing had not been applied to pt's L ankle so unable to assist pt getting OOB.  OT intervention with focus on bed mobility for UB/LB dressing tasks.  Rolling R/L with bed rails requiring +2 assist. Pt dependent for LB dressing tasks.  HOB elevated and pt using bed rails to sit upright with +2 to doff and don pullover shirt. Pt O2 sats>90% on 4L O2. O2 sats decreased below 90 when O2 removed for doffing/donning shirt. Pt remained in bed with all needs within reach.   Therapy Documentation Precautions:  Precautions Precautions: Fall Restrictions Weight Bearing Restrictions: No   Pain:  Pt with ongoing BLE spasms which are painful when they occur; emotional support and repositioned (temporarily)  Therapy/Group: Individual Therapy  [name] 10/28/2019, 1:55 PM

## 2019-10-28 NOTE — Plan of Care (Signed)
  Problem: Consults Goal: RH SPINAL CORD INJURY PATIENT EDUCATION Description:  See Patient Education module for education specifics.  Outcome: Progressing Goal: Skin Care Protocol Initiated - if Braden Score 18 or less Description: If consults are not indicated, leave blank or document N/A Outcome: Progressing   Problem: SCI BOWEL ELIMINATION Goal: RH STG MANAGE BOWEL WITH ASSISTANCE Description: STG Manage Bowel with min Assistance. Outcome: Progressing Goal: RH STG SCI MANAGE BOWEL PROGRAM W/ASSIST OR AS APPROPRIATE Description: STG SCI Manage bowel program w/ min assist or as appropriate. Outcome: Progressing   Problem: SCI BLADDER ELIMINATION Goal: RH STG MANAGE BLADDER WITH ASSISTANCE Description: STG Manage Bladder With min/mod Assistance Outcome: Progressing   Problem: RH SKIN INTEGRITY Goal: RH STG SKIN FREE OF INFECTION/BREAKDOWN Description: Patients skin will remain free from further infection or breakdown with mod assist. Outcome: Progressing Goal: RH STG MAINTAIN SKIN INTEGRITY WITH ASSISTANCE Description: STG Maintain Skin Integrity With mod Assistance. Outcome: Progressing Goal: RH STG ABLE TO PERFORM INCISION/WOUND CARE W/ASSISTANCE Description: STG Able To Perform Incision/Wound Care With total Assistance from caregiver. Outcome: Progressing   Problem: RH SAFETY Goal: RH STG ADHERE TO SAFETY PRECAUTIONS W/ASSISTANCE/DEVICE Description: STG Adhere to Safety Precautions With mod I Assistance/Device. Outcome: Progressing   Problem: RH PAIN MANAGEMENT Goal: RH STG PAIN MANAGED AT OR BELOW PT'S PAIN GOAL Description: < 4 Outcome: Progressing

## 2019-10-28 NOTE — Progress Notes (Signed)
Social Work Patient ID: [name], male   DOB: September 08, 1940, 79 y.o.   MRN: [id number]   SW met with pt in room to provide updates from team conference. SW called patient wife [name] [phone]) to inform on above, and indicate once there is bed availability in acute hospital there will be follow-up.   *Pt to d/c to acute hospital today-2W20 .    Loralee Pacas, MSW, [place] Office: [phone] Cell: [phone] Fax: [phone]

## 2019-10-28 NOTE — Progress Notes (Signed)
Discussed with patient regarding OR timing, will just have to see how he does day to day, lab trend, CXR, etc. Potentially could try for early next week if he's back to baseline.

## 2019-10-29 ENCOUNTER — Ambulatory Visit: Payer: Medicare Other | Admitting: Adult Health

## 2019-10-29 ENCOUNTER — Inpatient Hospital Stay (HOSPITAL_COMMUNITY): Payer: Medicare Other

## 2019-10-29 DIAGNOSIS — J9621 Acute and chronic respiratory failure with hypoxia: Secondary | ICD-10-CM

## 2019-10-29 DIAGNOSIS — I10 Essential (primary) hypertension: Secondary | ICD-10-CM

## 2019-10-29 DIAGNOSIS — G4733 Obstructive sleep apnea (adult) (pediatric): Secondary | ICD-10-CM

## 2019-10-29 DIAGNOSIS — Z9989 Dependence on other enabling machines and devices: Secondary | ICD-10-CM

## 2019-10-29 DIAGNOSIS — I712 Thoracic aortic aneurysm, without rupture: Secondary | ICD-10-CM

## 2019-10-29 LAB — BASIC METABOLIC PANEL
Anion gap: 12 (ref 5–15)
BUN: 15 mg/dL (ref 8–23)
CO2: 30 mmol/L (ref 22–32)
Calcium: 9.1 mg/dL (ref 8.9–10.3)
Chloride: 101 mmol/L (ref 98–111)
Creatinine, Ser: 0.76 mg/dL (ref 0.61–1.24)
GFR calc Af Amer: >60 mL/min (ref >60)
GFR calc non Af Amer: >60 mL/min (ref >60)
Glucose, Bld: 83 mg/dL (ref 70–99)
Potassium: 4.1 mmol/L (ref 3.5–5.1)
Sodium: 143 mmol/L (ref 135–145)

## 2019-10-29 LAB — URINE CULTURE: Culture: NO GROWTH

## 2019-10-29 LAB — CBC
HCT: 38.8 % — ABNORMAL LOW (ref 39.0–52.0)
Hemoglobin: 12.2 g/dL — ABNORMAL LOW (ref 13.0–17.0)
MCH: 28.6 pg (ref 26.0–34.0)
MCHC: 31.4 g/dL (ref 30.0–36.0)
MCV: 91.1 fL (ref 80.0–100.0)
Platelets: 286 10*3/uL (ref 150–400)
RBC: 4.26 MIL/uL (ref 4.22–5.81)
RDW: 14.4 % (ref 11.5–15.5)
WBC: 10.2 10*3/uL (ref 4.0–10.5)
nRBC: 0 % (ref 0.0–0.2)

## 2019-10-29 LAB — GLUCOSE, CAPILLARY
Glucose-Capillary: 88 mg/dL (ref 70–99)
Glucose-Capillary: 94 mg/dL (ref 70–99)
Glucose-Capillary: 107 mg/dL — ABNORMAL HIGH (ref 70–99)

## 2019-10-29 IMAGING — DX DG CHEST 1V PORT
1 series · 1 of 1 positions shown · non-contrast
Comparison: [DATE].

CLINICAL DATA: Acute on chronic respiratory failure with hypoxia.

EXAM:
PORTABLE CHEST 1 VIEW

[chest ap]
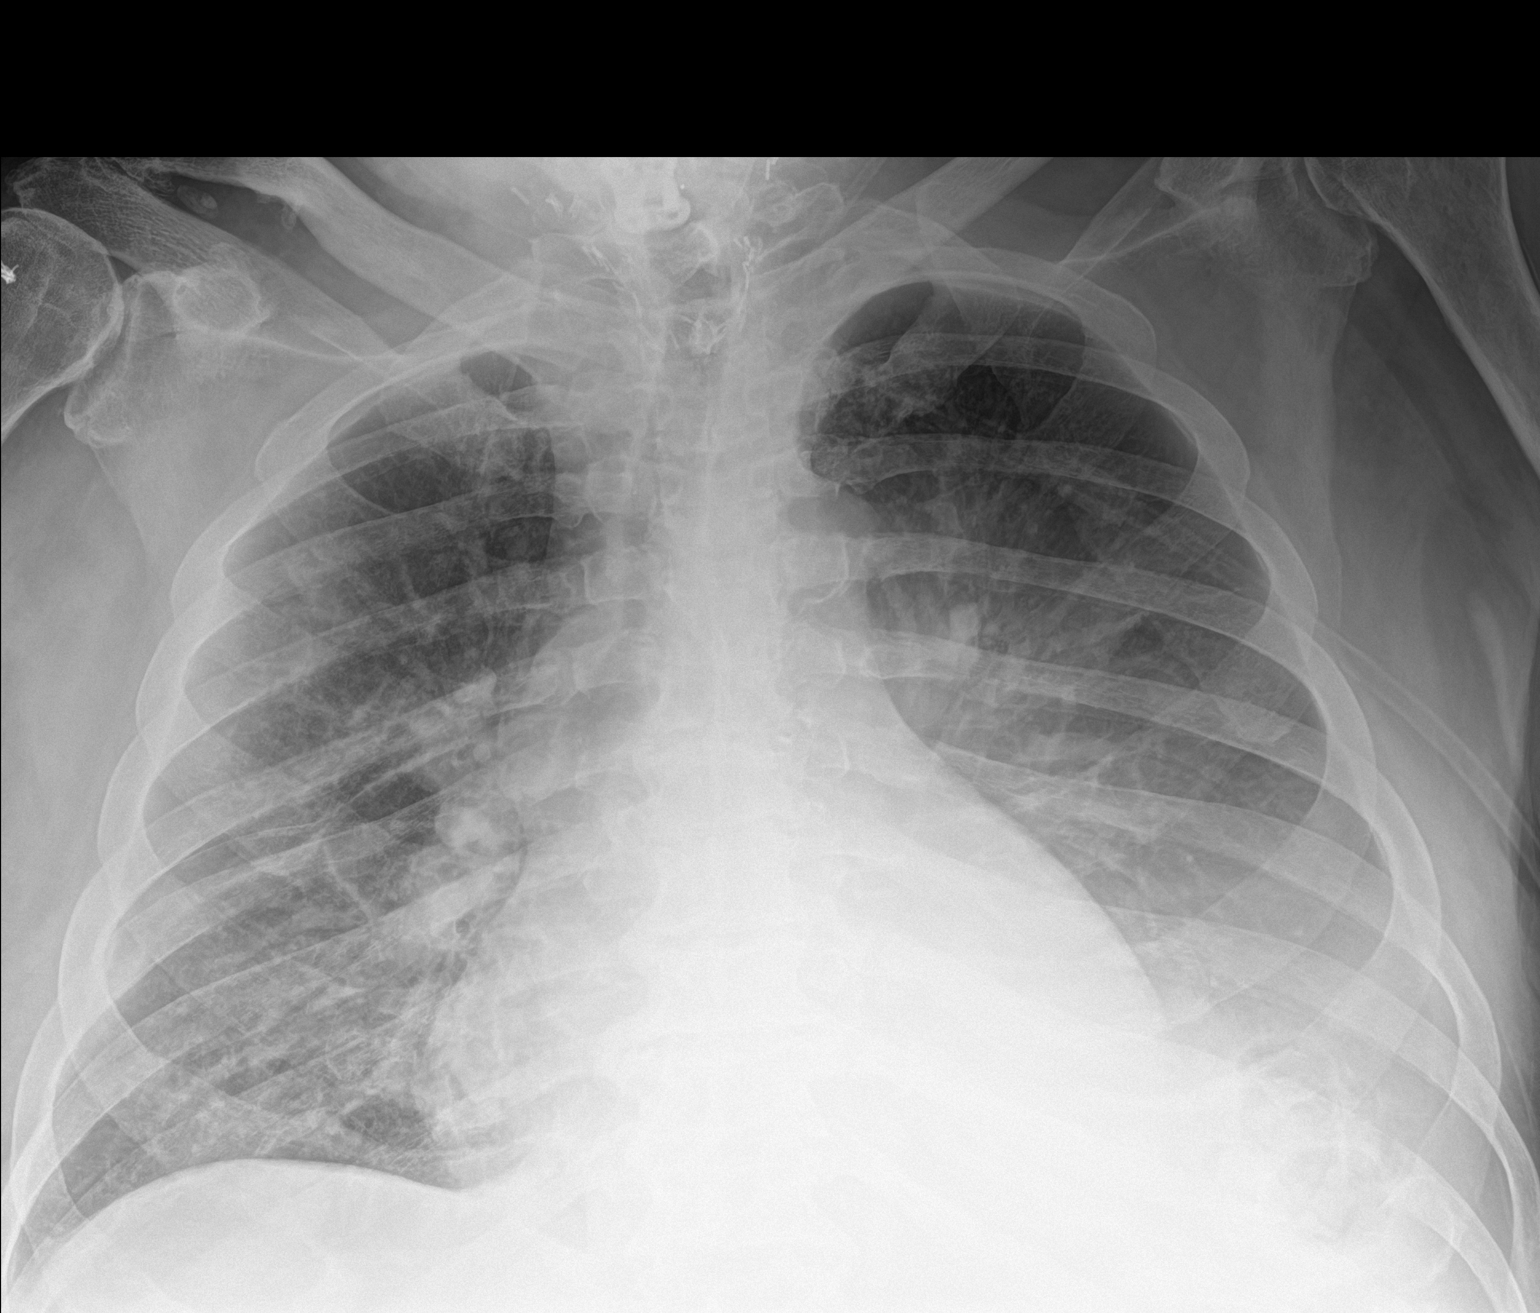

[1 of 1 positions shown; findings below may reference images not displayed]

FINDINGS: Stable cardiomediastinal silhouette. No pneumothorax is noted.
Stable left pleural effusion is noted with associated atelectasis or
infiltrate. Mild right basilar subsegmental atelectasis is noted.
Bony thorax is unremarkable.
IMPRESSION: Stable left pleural effusion is noted with associated atelectasis or
infiltrate. Mild right basilar subsegmental atelectasis is noted.

## 2019-10-29 MED ORDER — AMLODIPINE BESYLATE 5 MG PO TABS
5.0000 mg | ORAL_TABLET | Freq: Every day | ORAL | Status: DC
  Administered 2019-10-29 – 2019-10-30 (×2): 5 mg via ORAL
  Filled 2019-10-29 (×2): qty 1

## 2019-10-29 MED ORDER — FUROSEMIDE 10 MG/ML IJ SOLN
40.0000 mg | Freq: Once | INTRAMUSCULAR | Status: AC
  Administered 2019-10-29: 40 mg via INTRAVENOUS
  Filled 2019-10-29: qty 4

## 2019-10-29 MED ORDER — CHLORHEXIDINE GLUCONATE CLOTH 2 % EX PADS
6.0000 | MEDICATED_PAD | Freq: Every day | CUTANEOUS | Status: DC
  Administered 2019-10-29 – 2019-11-10 (×11): 6 via TOPICAL

## 2019-10-29 NOTE — Progress Notes (Signed)
PROGRESS NOTE    [name]  [id number] DOB: 1940-07-17 DOA: 10/28/2019 PCP: [name], MD   Brief Narrative: [name] is a 79 y.o. 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 Patient presented from inpatient rehab for worsening LE weakness and spasticity likely secondary to severe lumbar stenosis. Neurosurgery managing.   Assessment & Plan:   Principal Problem:   Incomplete paraplegia (HCC) Active Problems:   OSA on CPAP   Essential hypertension   Thoracic aortic aneurysm (HCC)   Acute on chronic respiratory failure with hypoxia (HCC)    Incomplete paraplegia Acute on chronic nerve root impingement of lumbar spine Associated worsening spasticity. Recent MRI significant for severe spinal stenosis of worst at L3-4 -Continue Baclofen 30 mg TID and 40 mg QHS -PT/OT recommendations -Neurosurgery recommendations  Acute on chronic respiratory failure with hypoxia Patient is on 2L via [place] as an outpatient. Patient has needed as much as 4 L. Recent chest x-ray significant for atelectasis but CT from 5/5 significant for large left pleural effusion with collapse of majority of left lung. Patient is declining to use incentive spirometer.  -Educated about use of incentive spirometer -Repeat chest x-ray  Ascending aortic aneurysm Patient and patient's wife were unaware of this diagnosis. CTA chest from 2019 significant for 4.2 cm ascending thoracic aortic aneurysm. Recent Transthoracic Echocardiogram (09/18/2019) significant for aortic root measuring 4.0 cm. Patient with poorly controlled blood pressure. History of CT chest consistent with atherosclerotic heart disease; on simvastatin 40 mg daily as an  outpatient -Aggressive blood pressure control -Heart healthy diet -Continue Simvastatin  Acute on chronic respiratory failure with hypoxia Patient is on 2L via  normally. Wean to room air.  Hypertensive urgency Essential hypertension Blood pressure has been mostly uncontrolled. Patient with evidence of hypertensive heart disease on Transthoracic Echocardiogram with mild LVH noted. Patient is not on medication as an outpatient.  Hyperlipidemia -Continue simvastatin  Concern for UTI Ruled out. No symptoms. Urine culture negative.  Bladder incontinence Bowel incontinence Chronic. Monitoring post-void residuals  Restless leg syndrome -Continue Sinemet  Myoclonic jerking -Continue Keppra  Hypothyroidism -Continue Synthroid  OSA -Continue CPAP qHS  Obesity Body mass index is 34.77 kg/m.   DVT prophylaxis: Lovenox Code Status:   Code Status: DNR Family Communication: Wife at bedside Disposition Plan: Discharge likely back to CIR pending neurosurgery recommendations   Consultants:   Neurosurgery  Procedures:   None  Antimicrobials:  Vancomycin  Cefepime    Subjective: Does not want to use incentive spirometer nor dose he want a heart healthy diet. No other concerns.  Objective: Vitals:   10/28/19 1625 10/28/19 2313 10/29/19 0500  BP: (!) 144/70    Pulse: 70    Resp: 18    Temp: 97.6 F (36.4 C)    TempSrc: Oral    SpO2: 94% 94%   Weight: 99.3 kg  100.7 kg  Height: 5\' 7"  (1.702 m)      Intake/Output Summary (Last 24 hours) at 10/29/2019 1013 Last data filed at 10/29/2019 0614 Gross per 24 hour  Intake 690 ml  Output 1150 ml  Net -460 ml   Filed Weights   10/28/19 1625 10/29/19 0500  Weight: 99.3 kg 100.7 kg    Examination:  General exam: Appears calm and  comfortable Respiratory system: Rhonchi bilaterally. Respiratory effort normal. Intermittent coughing. Cardiovascular system: S1 & S2 heard, RRR. No murmurs, rubs, gallops or  clicks. Gastrointestinal system: Abdomen is distended, soft and nontender. No organomegaly or masses felt. Normal bowel sounds heard. Central nervous system: Alert and oriented. No focal neurological deficits. Extremities: No edema. No calf tenderness Skin: No cyanosis. No rashes Psychiatry: Judgement and insight appear normal. Initially normal mood; became agitated after discussions of diet     Data Reviewed: I have personally reviewed following labs and imaging studies  CBC: Recent Labs  Lab 10/27/19 0500 10/28/19 0422 10/29/19 0625  WBC 15.5* 15.1* 10.2  NEUTROABS 12.4* 11.5*  --   HGB 11.5* 11.7* 12.2*  HCT 36.3* 37.6* 38.8*  MCV 91.4 91.5 91.1  PLT 313 306 [id number]   Basic Metabolic Panel: Recent Labs  Lab 10/27/19 0500 10/28/19 0422 10/29/19 0625  NA 141 142 143  K 3.8 3.6 4.1  CL 99 99 101  CO2 31 31 30   GLUCOSE 104* 97 83  BUN 10 13 15   CREATININE 0.73 0.74 0.76  CALCIUM 8.8* 9.0 9.1   GFR: Estimated Creatinine Clearance: 86 mL/min (by C-G formula based on SCr of 0.76 mg/dL). Liver Function Tests: No results for input(s): AST, ALT, ALKPHOS, BILITOT, PROT, ALBUMIN in the last 168 hours. No results for input(s): LIPASE, AMYLASE in the last 168 hours. No results for input(s): AMMONIA in the last 168 hours. Coagulation Profile: No results for input(s): INR, PROTIME in the last 168 hours. Cardiac Enzymes: No results for input(s): CKTOTAL, CKMB, CKMBINDEX, TROPONINI in the last 168 hours. BNP (last 3 results) Recent Labs    02/14/19 1001  PROBNP 482   HbA1C: No results for input(s): HGBA1C in the last 72 hours. CBG: Recent Labs  Lab 10/26/19 0217 10/28/19 2235  GLUCAP 108* 94   Lipid Profile: No results for input(s): CHOL, HDL, LDLCALC, TRIG, CHOLHDL, LDLDIRECT in the last 72 hours. Thyroid Function Tests: No results for input(s): TSH, T4TOTAL, FREET4, T3FREE, THYROIDAB in the last 72 hours. Anemia Panel: No results for input(s): VITAMINB12, FOLATE,  FERRITIN, TIBC, IRON, RETICCTPCT in the last 72 hours. Sepsis Labs: No results for input(s): PROCALCITON, LATICACIDVEN in the last 168 hours.  Recent Results (from the past 240 hour(s))  Surgical PCR screen     Status: Abnormal   Collection Time: 10/26/19 10:00 AM   Specimen: Nasal Mucosa; Nasal Swab  Result Value Ref Range Status   MRSA, PCR POSITIVE (A) NEGATIVE Final    Comment: RESULT CALLED TO, READ BACK BY AND VERIFIED WITH: RN SUSAN LEE 1247 10/26/19 KB    Staphylococcus aureus POSITIVE (A) NEGATIVE Final    Comment: RESULT CALLED TO, READ BACK BY AND VERIFIED WITH: RN SUSAN LEE 1247 10/26/19 KB (NOTE) The Xpert SA Assay (FDA approved for NASAL specimens in patients 72 years of age and older), is one component of a comprehensive surveillance program. It is not intended to diagnose infection nor to guide or monitor treatment.   Culture, Urine     Status: None   Collection Time: 10/28/19  1:20 PM   Specimen: Urine, Random  Result Value Ref Range Status   Specimen Description URINE, RANDOM  Final   Special Requests NONE  Final   Culture   Final    NO GROWTH Performed at [facility], [address], [place], [place] [address]    Report Status 10/29/2019 FINAL  Final         Radiology  Studies: DG CHEST PORT 1 VIEW  Result Date: 10/27/2019 CLINICAL DATA:  Increasing shortness of breath. EXAM: PORTABLE CHEST 1 VIEW COMPARISON:  Full-thickness ray dated Oct 24, 2019. FINDINGS: Stable cardiomediastinal silhouette. Normal pulmonary vascularity. Unchanged small to moderate left pleural effusion with left basilar atelectasis. No pneumothorax. Unchanged elevation of left hemidiaphragm. No acute osseous abnormality. IMPRESSION: 1. Unchanged left pleural effusion with associated basilar atelectasis. Electronically Signed   By: Titus Dubin M.D.   On: 10/27/2019 10:32        Scheduled Meds: . allopurinol  300 mg Oral Daily  . aspirin EC  81 mg Oral Daily  . baclofen   30 mg Oral TID WC  . baclofen  40 mg Oral QHS  . calcium citrate  200 mg of elemental calcium Oral Daily  . carbidopa-levodopa  1 tablet Oral BID  . collagenase   Topical Daily  . enoxaparin (LOVENOX) injection  40 mg Subcutaneous Q24H  . fluticasone furoate-vilanterol  1 puff Inhalation Daily  . gabapentin  100 mg Oral QHS  . guaiFENesin  1,200 mg Oral BID  . insulin aspart  0-9 Units Subcutaneous TID WC  . latanoprost  1 drop Both Eyes QHS  . levETIRAcetam  250 mg Oral BID  . levothyroxine  125 mcg Oral QAC breakfast  . multivitamin with minerals  1 tablet Oral Daily  . pantoprazole  40 mg Oral QPM  . polyethylene glycol  17 g Oral BID  . simvastatin  40 mg Oral q1800  . sodium chloride flush  3 mL Intravenous Q12H  . tamsulosin  0.8 mg Oral QPC supper  . tiZANidine  2 mg Oral TID   Continuous Infusions: . ceFEPime (MAXIPIME) IV 2 g (10/29/19 [id number])  . vancomycin Stopped (10/29/19 0700)     LOS: 1 day     Cordelia Poche, MD [facility] 10/29/2019, 10:13 AM  If 7PM-7AM, please contact night-coverage [email or web address]

## 2019-10-29 NOTE — Evaluation (Signed)
Physical Therapy Evaluation Patient Details Name: [name] MRN: [id number] DOB: [birth year] Today's Date: 10/29/2019   History of Present Illness  [name] is a 79 y.o. 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 Brett Fairy of neurology service as well as neurosurgery, myoclonic jerking, bowel bladder incontinence.Discharged to acute care services for surgical intervention L3-4 decompression  Clinical Impression  Pt admitted from CIR with intention of surgical intervention at L 3/4 with inability to work at the intensity of rehab.  Pt presently needs moderate assist of 2 for basic mobility.  Pt declined getting OOB into the chair fearing that he would be left in an uncomfortable position in the chair for too long..  Pt currently limited functionally due to the problems listed. ( See problems list.)   Pt will benefit from PT to maximize function and safety in order to get ready for next venue listed below.     Follow Up Recommendations CIR    Equipment Recommendations  None recommended by PT    Recommendations for Other Services       Precautions / Restrictions Precautions Precautions: Fall Precaution Comments: L lateral ankle ulcer, L buttock/sacral ulcer Restrictions Weight Bearing Restrictions: No      Mobility  Bed Mobility Overal bed mobility: Needs Assistance Bed Mobility: Supine to Sit;Sit to Supine Rolling: Mod assist;+2 for physical assistance   Supine to sit: Max assist;+2 for physical assistance Sit to supine: Total assist;+2 for  physical assistance   General bed mobility comments: verbal cues for hand placement with rolling and to avoid breath holding, mod assist to upright trunk and scoot to EOB  Transfers                 General transfer comment: refused transfers  Ambulation/Gait                Stairs            Wheelchair Mobility    Modified Rankin (Stroke Patients Only)       Balance Overall balance assessment: Needs assistance Sitting-balance support: No upper extremity supported;Feet supported Sitting balance-Leahy Scale: Fair Sitting balance - Comments: seated EOB x 15-20 minutes, min A for balance/support                                     Pertinent Vitals/Pain Pain Assessment: Faces Pain Score: 8  Faces Pain Scale: Hurts little more Pain Location: L lateral ankle, L buttocks/sacrum Pain Descriptors / Indicators: Grimacing;Spasm Pain Intervention(s): Limited activity within patient's tolerance;Monitored during session;Repositioned    Home Living Family/patient expects to be discharged to:: Private residence Living Arrangements: Spouse/significant other Available Help at Discharge: Family;Available 24 hours/day;Personal care attendant Type of Home: House Home Access: Ramped entrance     Home Layout: One level Home Equipment: [place] - 2 wheels;Wheelchair - [employer];Shower seat;Grab bars - tub/shower;Other (comment);Hand held shower head;Adaptive equipment Additional Comments: PCA assists with bed mobility and dressing (transfers). Pt d/c from CIR 10/27/19    Prior Function Level of Independence: Needs assistance   Gait / Transfers  Assistance Needed: PCA assists with bed mobility, transfers (SPT)  ADL's / Homemaking Assistance Needed: assist for dressing (including adult diaper changes), transfers for tub with shower chair, does not do IADL  Comments: PCA to assist in morning and evening 7-10AM and 7-10PM to help with bathing,  dressing, getting in/out of bed. Needs assist for transfers using RW, ambulated at most ~127ft 3-4 weeks ago using RW with w/c follow for safety during outpatient therapy but states a week later "I couldn't even stand up" at therapy.     Hand Dominance   Dominant Hand: Right    Extremity/Trunk Assessment   Upper Extremity Assessment Upper Extremity Assessment: Defer to OT evaluation    Lower Extremity Assessment Lower Extremity Assessment: RLE deficits/detail;LLE deficits/detail RLE Deficits / Details: Intermittent spasming  R greater than L,  Sitting EOB, pt's R LE tested at 3-/5, but on a limited basis.  Resistance to movement/spasm at a 4/5 on the mod [place]. RLE Coordination: decreased fine motor;decreased gross motor LLE Deficits / Details: intermittent spasming, LE spasm able to be inhibited enough to attain full knee extention.  L with less spasm than R LE and strength tested at >=3+/5 LLE Coordination: decreased fine motor    Cervical / Trunk Assessment Cervical / Trunk Assessment: Other exceptions Cervical / Trunk Exceptions: history of chronic back pain  Communication   Communication: No difficulties  Cognition Arousal/Alertness: Awake/alert Behavior During Therapy: WFL for tasks assessed/performed Overall Cognitive Status: Within Functional Limits for tasks assessed                                        General Comments      Exercises     Assessment/Plan    PT Assessment Patient needs continued PT services  PT Problem List Decreased range of motion;Decreased strength;Decreased activity tolerance;Decreased mobility;Decreased coordination;Impaired tone;Pain;Decreased knowledge of precautions       PT Treatment Interventions DME instruction;Gait training;Functional mobility training;Therapeutic activities;Balance training;Neuromuscular re-education;Patient/family education    PT Goals (Current goals can be found in the Care Plan section)   Acute Rehab PT Goals Patient Stated Goal: go home PT Goal Formulation: With patient Time For Goal Achievement: 11/12/19 Potential to Achieve Goals: Fair    Frequency Min 3X/week   Barriers to discharge        Co-evaluation PT/OT/SLP Co-Evaluation/Treatment: Yes Reason for Co-Treatment: Complexity of the patient's impairments (multi-system involvement) PT goals addressed during session: Mobility/safety with mobility OT goals addressed during session: ADL's and self-care       AM-PAC PT "6 Clicks" Mobility  Outcome Measure Help needed turning from your back to your side while in a flat bed without using bedrails?: A Lot Help needed moving from lying on your back to sitting on the side of a flat bed without using bedrails?: A Lot Help needed moving to and from a bed to a chair (including a wheelchair)?: A Lot Help needed standing up from a chair using your arms (e.g., wheelchair or bedside chair)?: A Lot Help needed to walk in hospital room?: Total Help needed climbing 3-5 steps with a railing? : Total 6 Click Score: 10    End of Session   Activity Tolerance: Patient tolerated treatment well Patient left: in bed;with call bell/phone within reach;with family/visitor present;with bed alarm set Nurse Communication: Mobility status PT Visit Diagnosis: Other abnormalities of gait and mobility (R26.89);Muscle weakness (generalized) (M62.81);Other symptoms and signs  involving the nervous system (R29.898)    Time: [id number] PT Time Calculation (min) (ACUTE ONLY): 48 min   Charges:   PT Evaluation $PT Eval Moderate Complexity: 1 Mod PT Treatments $Therapeutic Activity: 8-22 mins        10/29/2019  Ginger Carne., PT Acute Rehabilitation Services [phone]  (pager) [phone]  (office)  Tessie Fass Marranda Arakelian 10/29/2019, 1:40 PM

## 2019-10-29 NOTE — Evaluation (Signed)
Occupational Therapy Evaluation Patient Details Name: [name] MRN: [id number] DOB: 10-23-1940 Today's Date: 10/29/2019    History of Present Illness [name] is a 79 y.o. 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 Brett Fairy of neurology service as well as neurosurgery, myoclonic jerking, bowel bladder incontinence.Discharged to acute care services for surgical intervention L3-4 decompression   Clinical Impression   Pt presents with decline in function and safety with ADLs and ADL mobility with impaired strength, balance, endurance and is limited by pain in back, L ankle and L sacral/buttock area due to pressure ulcer. Pt recently on CIR and was d/c to acute for L3-4 decompression. Pt required mod A + 2 to sit EOB, min A for balance/support to sit EOB x15-20 minutes with F/F- balance. Pt is mod A with UB ADLs, max - total A with LB ADLs. Pt refused transfers to recliner despite max encouragement for therapists and his wife and stated that he was afarid that staff would leave him up too long and that the recliner would be too uncomfortable despite therapists explaining to him that he would be positioned with pillows and that it would be beneficial ot sit up at least an hour and that staff could put him back to  bed using mechanical lift. Pt would benefit from acute OT services to address impairments to maximize level of function and safety    Follow Up Recommendations  CIR;Supervision/Assistance - 24 hour    Equipment Recommendations  None recommended by OT     Recommendations for Other Services Rehab consult     Precautions / Restrictions Precautions Precautions: Fall Precaution Comments: L lateral ankle ulcer, L buttock/sacral ulcer Restrictions Weight Bearing Restrictions: No      Mobility Bed Mobility Overal bed mobility: Needs Assistance Bed Mobility: Supine to Sit;Sit to Supine Rolling: Mod assist;+2 for physical assistance   Supine to sit: Max assist;+2 for physical assistance Sit to supine: Total assist;+2 for physical assistance   General bed mobility comments: verbal cues for hand placement with rolling and to avoid breath holding, mod assist to upright trunk and scoot to EOB  Transfers                 General transfer comment: refused transfers    Balance Overall balance assessment: Needs assistance Sitting-balance support: No upper extremity supported;Feet supported Sitting balance-Leahy Scale: Fair Sitting balance - Comments: seated EOB x 15-20 minutes, min A for balance/support                                   ADL either performed or assessed with clinical judgement   ADL Overall ADL's : Needs assistance/impaired Eating/Feeding: Set up;Sitting   Grooming: Wash/dry hands;Wash/dry face;Sitting;Min guard Grooming Details (indicate cue type and reason): Fair sitting balance Upper Body Bathing: Moderate assistance;Sitting   Lower Body Bathing: Maximal assistance;Sitting/lateral leans   Upper Body Dressing : Moderate assistance   Lower Body Dressing: Total assistance     Toilet Transfer Details (indicate cue type and reason): pt declined any transfers, despite being enocuraged multiple times to  try Toileting- Clothing Manipulation and Hygiene: Total assistance;Bed level         General ADL Comments: decreased strength and activity tolerance     Vision Baseline Vision/History: Wears glasses Wears Glasses: Reading only Patient Visual Report: No change from baseline        Perception     Praxis      Pertinent Vitals/Pain Pain Assessment: 0-10 Pain Score: 8  Pain Location: L lateral ankle, L buttocks/sacrum Pain Descriptors / Indicators: Grimacing;Spasm Pain Intervention(s): Limited activity within patient's tolerance;Monitored during session;Repositioned     Hand Dominance Right   Extremity/Trunk Assessment Upper Extremity Assessment Upper Extremity Assessment: Generalized weakness   Lower Extremity Assessment Lower Extremity Assessment: Defer to PT evaluation   Cervical / Trunk Assessment Cervical / Trunk Assessment: Other exceptions Cervical / Trunk Exceptions: history of chronic back pain   Communication Communication Communication: No difficulties   Cognition Arousal/Alertness: Awake/alert Behavior During Therapy: WFL for tasks assessed/performed Overall Cognitive Status: Within Functional Limits for tasks assessed                                     General Comments       Exercises     Shoulder Instructions      Home Living Family/patient expects to be discharged to:: Private residence Living Arrangements: Spouse/significant other Available Help at Discharge: Family;Available 24 hours/day;Personal care attendant Type of Home: House Home Access: Ramped entrance     Home Layout: One level     Bathroom Shower/Tub: Occupational psychologist: Handicapped height Bathroom Accessibility: Yes How Accessible: Accessible via wheelchair Home Equipment: [place] - 2 wheels;Wheelchair - [employer];Shower seat;Grab bars - tub/shower;Other (comment);Hand held shower head;Adaptive equipment   Additional Comments: PCA assists with bed mobility and dressing (transfers). Pt d/c from CIR 10/27/19  Lives With: Spouse    Prior Functioning/Environment Level of Independence: Needs assistance  Gait / Transfers Assistance Needed: PCA assists with bed mobility, transfers (SPT) ADL's / Homemaking Assistance  Needed: assist for dressing (including adult diaper changes), transfers for tub with shower chair, does not do IADL   Comments: PCA to assist in morning and evening 7-10AM and 7-10PM to help with bathing, dressing, getting in/out of bed. Needs assist for transfers using RW, ambulated at most ~175ft 3-4 weeks ago using RW with w/c follow for safety during outpatient therapy but states a week later "I couldn't even stand up" at therapy.        OT Problem List: Decreased activity tolerance;Impaired balance (sitting and/or standing);Decreased strength;Obesity;Pain;Decreased safety awareness      OT Treatment/Interventions: Self-care/ADL training;Therapeutic exercise;Energy conservation;DME and/or AE instruction;Therapeutic activities;Patient/family education;Balance training    OT Goals(Current goals can be found in the care plan section) Acute Rehab OT Goals Patient Stated Goal: go home OT Goal Formulation: With patient/family Time For Goal Achievement: 11/12/19 Potential to Achieve Goals: Good ADL Goals Pt Will Perform Grooming: with supervision;with set-up;sitting Pt Will Perform Upper Body Bathing: with min assist;sitting Pt Will Perform Lower Body Bathing: with mod assist;sitting/lateral leans Pt Will Perform Upper Body Dressing: with min assist;sitting Pt Will Transfer to Toilet: with max assist;with mod assist;stand pivot transfer;bedside commode Additional ADL Goal #1: Pt will complete bed mobility mod A to sit OEB with F+/G- sitting balance for grooming and ADL tasks  OT Frequency: Min 2X/week   Barriers to D/C:  Co-evaluation PT/OT/SLP Co-Evaluation/Treatment: Yes Reason for Co-Treatment: Complexity of the patient's impairments (multi-system involvement) PT goals addressed during session: Mobility/safety with mobility OT goals addressed during session: ADL's and self-care      AM-PAC OT "6 Clicks" Daily Activity     Outcome Measure Help from another person  eating meals?: A Little Help from another person taking care of personal grooming?: A Little Help from another person toileting, which includes using toliet, bedpan, or urinal?: Total Help from another person bathing (including washing, rinsing, drying)?: Total Help from another person to put on and taking off regular upper body clothing?: A Lot Help from another person to put on and taking off regular lower body clothing?: Total 6 Click Score: 11   End of Session    Activity Tolerance: Patient limited by fatigue Patient left: with call bell/phone within reach;in bed;with bed alarm set;with family/visitor present  OT Visit Diagnosis: Other abnormalities of gait and mobility (R26.89);Muscle weakness (generalized) (M62.81);Pain Pain - Right/Left: Left Pain - part of body: Ankle and joints of foot(back, L buttocks/sacrum)                Time: [id number] OT Time Calculation (min): 49 min Charges:  OT Evaluation $OT Eval Moderate Complexity: 1 Mod    Britt Bottom 10/29/2019, 1:17 PM

## 2019-10-29 NOTE — Progress Notes (Addendum)
Social Work Discharge Note   The overall goal for the admission was met for:   Discharge location: Yes. D/c to acute hospital.    Length of Stay: Yes. 32 days.  Discharge activity level: Yes. Max Asst  Home/community participation: N/A  Services provided included: MD, RD, PT, OT, SLP, RN, CM, TR, Pharmacy, [place]: Private Insurance: [facility] Medicare  Follow-up services arranged: N/A  Comments (or additional information): N/A  Patient/Family verbalized understanding of follow-up arrangements:N/A  Individual responsible for coordination of the follow-up plan:   Confirmed correct DME delivered: Rana Snare 10/29/2019    Rana Snare

## 2019-10-30 LAB — BASIC METABOLIC PANEL
Anion gap: 13 (ref 5–15)
BUN: 16 mg/dL (ref 8–23)
CO2: 28 mmol/L (ref 22–32)
Calcium: 8.9 mg/dL (ref 8.9–10.3)
Chloride: 101 mmol/L (ref 98–111)
Creatinine, Ser: 0.86 mg/dL (ref 0.61–1.24)
GFR calc Af Amer: >60 mL/min (ref >60)
GFR calc non Af Amer: >60 mL/min (ref >60)
Glucose, Bld: 92 mg/dL (ref 70–99)
Potassium: 3.5 mmol/L (ref 3.5–5.1)
Sodium: 142 mmol/L (ref 135–145)

## 2019-10-30 LAB — GLUCOSE, CAPILLARY
Glucose-Capillary: 85 mg/dL (ref 70–99)
Glucose-Capillary: 97 mg/dL (ref 70–99)
Glucose-Capillary: 105 mg/dL — ABNORMAL HIGH (ref 70–99)

## 2019-10-30 MED ORDER — ATORVASTATIN CALCIUM 10 MG PO TABS
20.0000 mg | ORAL_TABLET | Freq: Every day | ORAL | Status: DC
  Administered 2019-10-30 – 2019-11-09 (×11): 20 mg via ORAL
  Filled 2019-10-30 (×11): qty 2

## 2019-10-30 MED ORDER — POLYETHYLENE GLYCOL 3350 17 G PO PACK
17.0000 g | PACK | Freq: Every day | ORAL | Status: DC
  Administered 2019-11-01 – 2019-11-10 (×7): 17 g via ORAL
  Filled 2019-10-30 (×7): qty 1

## 2019-10-30 MED ORDER — FUROSEMIDE 40 MG PO TABS
40.0000 mg | ORAL_TABLET | Freq: Every day | ORAL | Status: DC
  Administered 2019-10-30 – 2019-11-10 (×11): 40 mg via ORAL
  Filled 2019-10-30 (×11): qty 1

## 2019-10-30 MED ORDER — METOPROLOL SUCCINATE ER 50 MG PO TB24
50.0000 mg | ORAL_TABLET | Freq: Every day | ORAL | Status: DC
  Administered 2019-10-30 – 2019-11-10 (×11): 50 mg via ORAL
  Filled 2019-10-30 (×12): qty 1

## 2019-10-30 MED ORDER — UMECLIDINIUM BROMIDE 62.5 MCG/INH IN AEPB
1.0000 | INHALATION_SPRAY | Freq: Every day | RESPIRATORY_TRACT | Status: DC
  Administered 2019-10-31 – 2019-11-10 (×6): 1 via RESPIRATORY_TRACT
  Filled 2019-10-30 (×2): qty 7

## 2019-10-30 MED ORDER — SPIRONOLACTONE 25 MG PO TABS
25.0000 mg | ORAL_TABLET | Freq: Every day | ORAL | Status: DC
  Administered 2019-10-30 – 2019-11-10 (×11): 25 mg via ORAL
  Filled 2019-10-30 (×11): qty 1

## 2019-10-30 NOTE — Progress Notes (Addendum)
PROGRESS NOTE    [name]  [id number] DOB: 1941/03/28 DOA: 10/28/2019 PCP: [name], MD   Brief Narrative: [name] is a 79 y.o. 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 Patient presented from inpatient rehab for worsening LE weakness and spasticity likely secondary to severe lumbar stenosis. Neurosurgery managing.   Assessment & Plan:   Principal Problem:   Incomplete paraplegia (HCC) Active Problems:   OSA on CPAP   Essential hypertension   Thoracic aortic aneurysm (HCC)   Acute on chronic respiratory failure with hypoxia (HCC)    Incomplete paraplegia Acute on chronic nerve root impingement of lumbar spine Associated worsening spasticity. Recent MRI significant for severe spinal stenosis of worst at L3-4 -Continue Baclofen 30 mg TID and 40 mg QHS -PT/OT recommendations: CIR -Neurosurgery recommendations: pending  Acute on chronic respiratory failure with hypoxia Patient is on 2L via  as an outpatient. Patient has needed as much as 4 L. Recent chest x-ray significant for atelectasis but CT from 5/5 significant for large left pleural effusion with collapse of majority of left lung. Patient is declining to use incentive spirometer. Complicated by left hemidiaphragm paresis. -Continue IS and flutter  Ascending aortic aneurysm Patient and patient's wife were unaware of this diagnosis. CTA chest from 2019 significant for 4.2 cm ascending thoracic aortic aneurysm. Recent Transthoracic Echocardiogram (09/18/2019) significant for aortic root measuring 4.0 cm. Patient with poorly controlled blood pressure. History of CT chest consistent with atherosclerotic heart disease; on simvastatin  40 mg daily as an outpatient -Aggressive blood pressure control -Heart healthy diet -Continue Simvastatin  Hypertensive urgency Essential hypertension Blood pressure has been mostly uncontrolled. Patient with evidence of hypertensive heart disease on Transthoracic Echocardiogram with mild LVH noted. After further chart review, patient was on metoprolol succinate 50 mg daily, furosemide 40 mg daily and spironolactone 25 mg daily. -Discontinue amlodipine -Restart home metoprolol succinate 50 mg daily, furosemide 40 mg daily and spironolactone 25 mg daily  Hyperlipidemia -Continue simvastatin  Concern for UTI Ruled out. No symptoms. Urine culture negative. Discontinue antibiotics.  Chronic diastolic heart failure Per discussion with patient and wife, patient was previously on Lasix and Spironolactone.  Bladder incontinence Bowel incontinence Chronic. Monitoring post-void residuals  Restless leg syndrome -Continue Sinemet  Myoclonic jerking -Continue Keppra  Hypothyroidism -Continue Synthroid  OSA -Continue CPAP qHS  Constipation Patient is having frequent loose stools -Decrease to Miralax daily. Will need a bowel regimen with narcotics  Prediabetes Hemoglobin A1C of 5.8%. Patient is on metformin as an outpatient -Discontinue SSI; daily CBG in AM  Obesity Body mass index is 34.77 kg/m.   DVT prophylaxis: Lovenox Code Status:   Code Status: DNR Family Communication: Wife at bedside Disposition Plan: Discharge likely back to CIR pending neurosurgery recommendations   Consultants:   Neurosurgery  Procedures:   None  Antimicrobials:  Vancomycin  Cefepime    Subjective: Does not want to use incentive spirometer nor dose he want a heart healthy diet. No other concerns.  Objective: Vitals:   10/30/19 0010 10/30/19 0423 10/30/19 0500 10/30/19 0834  BP: (!) 151/75 (!) 161/83    Pulse: 67     Resp: 17     Temp: 98.4 F (36.9 C)     TempSrc: Axillary  SpO2: 94%   93%  Weight:   100.7 kg   Height:        Intake/Output Summary (Last 24 hours) at 10/30/2019 1330 Last data filed at 10/30/2019 1119 Gross per 24 hour  Intake 790 ml  Output 5450 ml  Net -4660 ml   Filed Weights   10/28/19 1625 10/29/19 0500 10/30/19 0500  Weight: 99.3 kg 100.7 kg 100.7 kg    Examination:  General exam: Appears calm and comfortable Respiratory system: Rhonchi. Respiratory effort normal. Cardiovascular system: S1 & S2 heard, RRR. No murmurs, rubs, gallops or clicks. Gastrointestinal system: Abdomen is distended, soft and nontender. No organomegaly or masses felt. Decreased bowel sounds heard. Central nervous system: Alert and oriented. Extremities: No edema. No calf tenderness Skin: No cyanosis. No rashes Psychiatry: Judgement and insight appear normal. Mood & affect appropriate.     Data Reviewed: I have personally reviewed following labs and imaging studies  CBC: Recent Labs  Lab 10/27/19 0500 10/28/19 0422 10/29/19 0625  WBC 15.5* 15.1* 10.2  NEUTROABS 12.4* 11.5*  --   HGB 11.5* 11.7* 12.2*  HCT 36.3* 37.6* 38.8*  MCV 91.4 91.5 91.1  PLT 313 306 [id number]   Basic Metabolic Panel: Recent Labs  Lab 10/27/19 0500 10/28/19 0422 10/29/19 0625 10/30/19 0336  NA 141 142 143 142  K 3.8 3.6 4.1 3.5  CL 99 99 101 101  CO2 31 31 30 28   GLUCOSE 104* 97 83 92  BUN 10 13 15 16   CREATININE 0.73 0.74 0.76 0.86  CALCIUM 8.8* 9.0 9.1 8.9   GFR: Estimated Creatinine Clearance: 80 mL/min (by C-G formula based on SCr of 0.86 mg/dL). Liver Function Tests: No results for input(s): AST, ALT, ALKPHOS, BILITOT, PROT, ALBUMIN in the last 168 hours. No results for input(s): LIPASE, AMYLASE in the last 168 hours. No results for input(s): AMMONIA in the last 168 hours. Coagulation Profile: No results for input(s): INR, PROTIME in the last 168 hours. Cardiac Enzymes: No results for input(s): CKTOTAL, CKMB, CKMBINDEX, TROPONINI in the last 168  hours. BNP (last 3 results) Recent Labs    02/14/19 1001  PROBNP 482   HbA1C: No results for input(s): HGBA1C in the last 72 hours. CBG: Recent Labs  Lab 10/29/19 1146 10/29/19 1543 10/29/19 2058 10/30/19 0416 10/30/19 0743  GLUCAP 88 94 107* 85 97   Lipid Profile: No results for input(s): CHOL, HDL, LDLCALC, TRIG, CHOLHDL, LDLDIRECT in the last 72 hours. Thyroid Function Tests: No results for input(s): TSH, T4TOTAL, FREET4, T3FREE, THYROIDAB in the last 72 hours. Anemia Panel: No results for input(s): VITAMINB12, FOLATE, FERRITIN, TIBC, IRON, RETICCTPCT in the last 72 hours. Sepsis Labs: No results for input(s): PROCALCITON, LATICACIDVEN in the last 168 hours.  Recent Results (from the past 240 hour(s))  Surgical PCR screen     Status: Abnormal   Collection Time: 10/26/19 10:00 AM   Specimen: Nasal Mucosa; Nasal Swab  Result Value Ref Range Status   MRSA, PCR POSITIVE (A) NEGATIVE Final    Comment: RESULT CALLED TO, READ BACK BY AND VERIFIED WITH: RN SUSAN LEE 1247 10/26/19 KB    Staphylococcus aureus POSITIVE (A) NEGATIVE Final    Comment: RESULT CALLED TO, READ BACK BY AND VERIFIED WITH: RN SUSAN LEE 1247 10/26/19 KB (NOTE) The Xpert SA Assay (FDA approved for NASAL specimens in patients 45 years of age and older), is one component of a comprehensive surveillance program. It is not intended to diagnose infection nor to guide or monitor  treatment.   Culture, Urine     Status: None   Collection Time: 10/28/19  1:20 PM   Specimen: Urine, Random  Result Value Ref Range Status   Specimen Description URINE, RANDOM  Final   Special Requests NONE  Final   Culture   Final    NO GROWTH Performed at [facility], [address] [place],  [address]    Report Status 10/29/2019 FINAL  Final         Radiology Studies: DG CHEST PORT 1 VIEW  Result Date: 10/29/2019 CLINICAL DATA:  Acute on chronic respiratory failure with hypoxia. EXAM: PORTABLE CHEST 1  VIEW COMPARISON:  Oct 27, 2019. FINDINGS: Stable cardiomediastinal silhouette. No pneumothorax is noted. Stable left pleural effusion is noted with associated atelectasis or infiltrate. Mild right basilar subsegmental atelectasis is noted. Bony thorax is unremarkable. IMPRESSION: Stable left pleural effusion is noted with associated atelectasis or infiltrate. Mild right basilar subsegmental atelectasis is noted. Electronically Signed   By: Marijo Conception M.D.   On: 10/29/2019 12:28        Scheduled Meds: . allopurinol  300 mg Oral Daily  . amLODipine  5 mg Oral Daily  . aspirin EC  81 mg Oral Daily  . atorvastatin  20 mg Oral q1800  . baclofen  30 mg Oral TID WC  . baclofen  40 mg Oral QHS  . calcium citrate  200 mg of elemental calcium Oral Daily  . carbidopa-levodopa  1 tablet Oral BID  . Chlorhexidine Gluconate Cloth  6 each Topical Daily  . collagenase   Topical Daily  . enoxaparin (LOVENOX) injection  40 mg Subcutaneous Q24H  . fluticasone furoate-vilanterol  1 puff Inhalation Daily  . gabapentin  100 mg Oral QHS  . guaiFENesin  1,200 mg Oral BID  . latanoprost  1 drop Both Eyes QHS  . levETIRAcetam  250 mg Oral BID  . levothyroxine  125 mcg Oral QAC breakfast  . multivitamin with minerals  1 tablet Oral Daily  . pantoprazole  40 mg Oral QPM  . polyethylene glycol  17 g Oral BID  . sodium chloride flush  3 mL Intravenous Q12H  . tamsulosin  0.8 mg Oral QPC supper  . tiZANidine  2 mg Oral TID  . umeclidinium bromide  1 puff Inhalation Daily   Continuous Infusions:    LOS: 2 days     Cordelia Poche, MD [facility] 10/30/2019, 1:30 PM  If 7PM-7AM, please contact night-coverage [email or web address]

## 2019-10-31 LAB — BASIC METABOLIC PANEL
Anion gap: 12 (ref 5–15)
BUN: 18 mg/dL (ref 8–23)
CO2: 33 mmol/L — ABNORMAL HIGH (ref 22–32)
Calcium: 9.1 mg/dL (ref 8.9–10.3)
Chloride: 97 mmol/L — ABNORMAL LOW (ref 98–111)
Creatinine, Ser: 1.04 mg/dL (ref 0.61–1.24)
GFR calc Af Amer: >60 mL/min (ref >60)
GFR calc non Af Amer: >60 mL/min (ref >60)
Glucose, Bld: 102 mg/dL — ABNORMAL HIGH (ref 70–99)
Potassium: 3.3 mmol/L — ABNORMAL LOW (ref 3.5–5.1)
Sodium: 142 mmol/L (ref 135–145)

## 2019-10-31 MED ORDER — POTASSIUM CHLORIDE CRYS ER 20 MEQ PO TBCR
40.0000 meq | EXTENDED_RELEASE_TABLET | Freq: Once | ORAL | Status: AC
  Administered 2019-10-31: 40 meq via ORAL
  Filled 2019-10-31: qty 2

## 2019-10-31 NOTE — Progress Notes (Signed)
Physical Therapy Treatment Patient Details Name: [name] MRN: [id number] DOB: 03/10/1941 Today's Date: 10/31/2019    History of Present Illness [name] is a 79 y.o. 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 Brett Fairy of neurology service as well as neurosurgery, myoclonic jerking, bowel bladder incontinence.Discharged to acute care services for surgical intervention L3-4 decompression (no note in chart but pt reports possible sx 11/06/19)    PT Comments    Pt tolerated and was pleased to be up in w/c for 3 hours.  Required cues for rolling transfers when back to bed and maxi move for transfer to bed.    Follow Up Recommendations  CIR     Equipment Recommendations  None recommended by PT    Recommendations for Other Services       Precautions / Restrictions Precautions Precautions: Fall Precaution Comments: L lateral ankle ulcer, L buttock/sacral ulcer    Mobility  Bed Mobility Overal bed mobility: Needs Assistance Bed Mobility: Rolling Rolling: Mod assist         General bed mobility comments: Rolling both directions - cues for hand placement and for trying not to twist back for pain control  Transfers                 General transfer comment: Used maxi move to assist pt back to bed.  Ambulation/Gait                 Stairs             Wheelchair Mobility    Modified Rankin (Stroke Patients Only)       Balance                                            Cognition Arousal/Alertness:  Awake/alert Behavior During Therapy: WFL for tasks assessed/performed Overall Cognitive Status: Within Functional Limits for tasks assessed                                        Exercises General Exercises - Lower Extremity Ankle Circles/Pumps: AAROM;Both;5 reps(gentle ROM) Long Arc Quad: Both;5 reps;AAROM    General Comments General comments (skin integrity, edema, etc.): Pt tolerated sitting in tilt in space w/c for 3 hours      Pertinent Vitals/Pain Pain Assessment: Faces Faces Pain Scale: Hurts little more Pain Location: L lateral ankle, L buttocks/sacrum -when spasms Pain Descriptors / Indicators: Grimacing;Spasm Pain Intervention(s): Limited activity within patient's tolerance;Monitored during session;Repositioned    Home Living                      Prior Function            PT Goals (current goals can now be found in the care plan section) Progress towards PT goals: Progressing toward goals    Frequency    Min 3X/week      PT Plan Current plan remains appropriate  Co-evaluation              AM-PAC PT "6 Clicks" Mobility   Outcome Measure  Help needed turning from your back to your side while in a flat bed without using bedrails?: A Lot Help needed moving from lying on your back to sitting on the side of a flat bed without using bedrails?: A Lot Help needed moving to and from a bed to a chair (including a wheelchair)?: Total Help needed standing up from a chair using your arms (e.g., wheelchair or bedside chair)?: Total Help needed to walk in hospital room?: Total Help needed climbing 3-5 steps with a railing? : Total 6 Click Score: 8    End of Session Equipment Utilized During Treatment: Gait belt Activity Tolerance: Patient tolerated treatment well Patient left: in bed;with call bell/phone within reach Nurse Communication: Mobility status PT Visit Diagnosis: Other abnormalities of gait and mobility (R26.89);Muscle  weakness (generalized) (M62.81);Other symptoms and signs involving the nervous system (R29.898)     Time: 1738-1800 PT Time Calculation (min) (ACUTE ONLY): 22 min  Charges:  $Therapeutic Activity: 8-22 mins                     Maggie Font, PT Acute Rehab Services Pager [phone] [facility] [phone] [facility] [phone]    [name] 10/31/2019, 6:16 PM

## 2019-10-31 NOTE — Progress Notes (Signed)
Physical Therapy Treatment Patient Details Name: [name] MRN: [id number] DOB: [birth year] Today's Date: 10/31/2019    History of Present Illness [name] is a 79 y.o. 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 Brett Fairy of neurology service as well as neurosurgery, myoclonic jerking, bowel bladder incontinence.Discharged to acute care services for surgical intervention L3-4 decompression (no note in chart but pt reports possible sx 11/06/19)    PT Comments    Pt wanting to sit up in chair for a meal.  He is uncomfortable in recliner - borrowed tilt w/c from CIR for the pt.  Pt required maxi-move for transfer to chair.  Required increased time for maxi move pad placement and for positioning in chair to support legs due to spasms.  Pt able to perform some LE exercises but limited by pain and spasms.  Per pt plan is for possible surgery next week.  Will follow as able, but progress may be limited until surgery.     Follow Up Recommendations  CIR     Equipment Recommendations  None recommended by PT    Recommendations for Other Services       Precautions / Restrictions Precautions Precautions: Fall Precaution Comments: L lateral ankle ulcer, L buttock/sacral ulcer    Mobility  Bed Mobility Overal bed mobility: Needs Assistance Bed Mobility: Rolling Rolling: Mod assist         General bed mobility comments: Rolling both directions - cues for hand placement and for trying not to twist back for pain control  Transfers                  General transfer comment: Attempted to encourage pt for lateral scoot but states due to leg weakness he had been using maxi move and rehab and prefers to do that way.  Used Maxi Move to move pt to w/c.  Pt required max A and cues for weight shifting to scoot back in chair once there.  Ambulation/Gait                 Stairs             Wheelchair Mobility    Modified Rankin (Stroke Patients Only)       Balance                                            Cognition Arousal/Alertness: Awake/alert Behavior During Therapy: WFL for tasks assessed/performed Overall Cognitive Status: Within Functional Limits for tasks assessed                                        Exercises General Exercises - Lower Extremity Ankle Circles/Pumps: AAROM;Both;5 reps(gentle ROM) Long Arc Quad: Both;5 reps;AAROM    General Comments General comments (skin integrity, edema, etc.): Increased time shifting and positioning pt in comfortable position in tilt in space w/c with cushion (borrowed from CIR)      Pertinent Vitals/Pain  Pain Assessment: Faces Faces Pain Scale: Hurts whole lot Pain Location: L lateral ankle, L buttocks/sacrum -when spasms Pain Descriptors / Indicators: Grimacing;Spasm Pain Intervention(s): Limited activity within patient's tolerance;Monitored during session;Repositioned    Home Living                      Prior Function            PT Goals (current goals can now be found in the care plan section) Progress towards PT goals: Progressing toward goals    Frequency    Min 3X/week      PT Plan Current plan remains appropriate    Co-evaluation              AM-PAC PT "6 Clicks" Mobility   Outcome Measure  Help needed turning from your back to your side while in a flat bed without using bedrails?: A Lot Help needed moving from lying on your back to sitting on the side of a flat bed without using bedrails?:  A Lot Help needed moving to and from a bed to a chair (including a wheelchair)?: Total Help needed standing up from a chair using your arms (e.g., wheelchair or bedside chair)?: Total Help needed to walk in hospital room?: Total Help needed climbing 3-5 steps with a railing? : Total 6 Click Score: 8    End of Session   Activity Tolerance: Patient tolerated treatment well Patient left: Other (comment);with call bell/phone within reach(in w/c) Nurse Communication: Mobility status PT Visit Diagnosis: Other abnormalities of gait and mobility (R26.89);Muscle weakness (generalized) (M62.81);Other symptoms and signs involving the nervous system (R29.898)     Time: 1420-1500 PT Time Calculation (min) (ACUTE ONLY): 40 min  Charges:  $Therapeutic Exercise: 8-22 mins $Therapeutic Activity: 23-37 mins                     Maggie Font, PT Acute Rehab Services Pager [phone] [facility] [phone] [facility] [phone]    Karlton Lemon 10/31/2019, 4:04 PM

## 2019-10-31 NOTE — Progress Notes (Signed)
Discussed with patient, doing better from a respiratory perspective. Will plan on OR Tuesday late morning for MIS decompression. Will need to be NPO at midnight with enoxaparin held on Monday/Tuesday.

## 2019-10-31 NOTE — Progress Notes (Signed)
PROGRESS NOTE    [name]  [id number] DOB: [birth year] DOA: 10/28/2019 PCP: [name], MD   Brief Narrative: [name] is a 79 y.o. 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 Patient presented from inpatient rehab for worsening LE weakness and spasticity likely secondary to severe lumbar stenosis. Neurosurgery managing.   Assessment & Plan:   Principal Problem:   Incomplete paraplegia (HCC) Active Problems:   OSA on CPAP   Essential hypertension   Thoracic aortic aneurysm (HCC)   Acute on chronic respiratory failure with hypoxia (HCC)    Incomplete paraplegia Acute on chronic nerve root impingement of lumbar spine Associated worsening spasticity. Recent MRI significant for severe spinal stenosis of worst at L3-4 -Continue Baclofen 30 mg TID and 40 mg QHS -PT/OT recommendations: CIR -Neurosurgery recommendations: pending  Acute on chronic respiratory failure with hypoxia Patient is on 2L via [place] as an outpatient. Patient has needed as much as 4 L. Recent chest x-ray significant for atelectasis but CT from 5/5 significant for large left pleural effusion with collapse of majority of left lung. Patient is declining to use incentive spirometer. Complicated by left hemidiaphragm paresis. Weaned back down to 2 L. -Continue IS and flutter  Ascending aortic aneurysm Patient and patient's wife were unaware of this diagnosis. CTA chest from 2019 significant for 4.2 cm ascending thoracic aortic aneurysm. Recent Transthoracic Echocardiogram (09/18/2019) significant for aortic root measuring 4.0 cm. Patient with poorly controlled blood pressure. History of CT chest consistent with atherosclerotic heart  disease; on simvastatin 40 mg daily as an outpatient -Aggressive blood pressure control -Heart healthy diet -Continue Simvastatin  Hypertensive urgency Essential hypertension Blood pressure has been mostly uncontrolled. Patient with evidence of hypertensive heart disease on Transthoracic Echocardiogram with mild LVH noted. After further chart review, patient was on metoprolol succinate 50 mg daily, furosemide 40 mg daily and spironolactone 25 mg daily. -Continue home metoprolol succinate 50 mg daily, furosemide 40 mg daily and spironolactone 25 mg daily  Hyperlipidemia -Continue simvastatin  Concern for UTI Ruled out. No symptoms. Urine culture negative. Discontinue antibiotics.  Chronic diastolic heart failure Per discussion with patient and wife, patient was previously on Lasix and Spironolactone. -Continue Lasix and spironolactone  Bladder incontinence Bowel incontinence Chronic. Monitoring post-void residuals  Restless leg syndrome -Continue Sinemet  Myoclonic jerking -Continue Keppra  Hypothyroidism -Continue Synthroid  OSA -Continue CPAP qHS  Constipation Patient is having frequent loose stools -Miralax daily. Will need a bowel regimen with narcotics  Prediabetes Hemoglobin A1C of 5.8%. Patient is on metformin as an outpatient -Discontinue SSI; daily CBG in AM  Obesity Body mass index is 34.77 kg/m.   DVT prophylaxis: Lovenox Code Status:   Code Status: DNR Family Communication: Wife at bedside Disposition Plan: Discharge likely back to CIR pending neurosurgery recommendations   Consultants:   Neurosurgery  Procedures:   None  Antimicrobials:  Vancomycin  Cefepime    Subjective: No issues today. Breathing better.  Objective: Vitals:   10/30/19 2139 10/30/19 2346 10/31/19 0811 10/31/19 0827  BP: 130/77 130/62  118/65  Pulse: 81 62  (!) 57  Resp: 18 17  19   Temp: 98.6 F (37 C) 98.7 F (37.1 C)  (!) 97.2 F (36.2 C)  TempSrc: Oral  Axillary  SpO2: 91% 94% 91% 90%  Weight:      Height:        Intake/Output Summary (Last 24 hours) at 10/31/2019 1146 Last data filed at 10/31/2019 0950 Gross per 24 hour  Intake --  Output 2675 ml  Net -2675 ml   Filed Weights   10/28/19 1625 10/29/19 0500 10/30/19 0500  Weight: 99.3 kg 100.7 kg 100.7 kg    Examination:  General exam: Appears calm and comfortable Respiratory system: Clear to auscultation. Respiratory effort normal. Cardiovascular system: S1 & S2 heard, RRR. No murmurs, rubs, gallops or clicks. Gastrointestinal system: Abdomen is nondistended, soft and nontender. No organomegaly or masses felt. Normal bowel sounds heard. Central nervous system: Alert and oriented. Extremities: No edema. No calf tenderness Skin: No cyanosis. No rashes Psychiatry: Judgement and insight appear normal. Mood & affect appropriate.      Data Reviewed: I have personally reviewed following labs and imaging studies  CBC: Recent Labs  Lab 10/27/19 0500 10/28/19 0422 10/29/19 0625  WBC 15.5* 15.1* 10.2  NEUTROABS 12.4* 11.5*  --   HGB 11.5* 11.7* 12.2*  HCT 36.3* 37.6* 38.8*  MCV 91.4 91.5 91.1  PLT 313 306 [id number]   Basic Metabolic Panel: Recent Labs  Lab 10/27/19 0500 10/28/19 0422 10/29/19 0625 10/30/19 0336 10/31/19 0500  NA 141 142 143 142 142  K 3.8 3.6 4.1 3.5 3.3*  CL 99 99 101 101 97*  CO2 31 31 30 28  33*  GLUCOSE 104* 97 83 92 102*  BUN 10 13 15 16 18   CREATININE 0.73 0.74 0.76 0.86 1.04  CALCIUM 8.8* 9.0 9.1 8.9 9.1   GFR: Estimated Creatinine Clearance: 66.2 mL/min (by C-G formula based on SCr of 1.04 mg/dL). Liver Function Tests: No results for input(s): AST, ALT, ALKPHOS, BILITOT, PROT, ALBUMIN in the last 168 hours. No results for input(s): LIPASE, AMYLASE in the last 168 hours. No results for input(s): AMMONIA in the last 168 hours. Coagulation Profile: No results for input(s): INR, PROTIME in the last 168 hours. Cardiac Enzymes: No results for  input(s): CKTOTAL, CKMB, CKMBINDEX, TROPONINI in the last 168 hours. BNP (last 3 results) Recent Labs    02/14/19 1001  PROBNP 482   HbA1C: No results for input(s): HGBA1C in the last 72 hours. CBG: Recent Labs  Lab 10/29/19 1543 10/29/19 2058 10/30/19 0416 10/30/19 0743 10/30/19 2134  GLUCAP 94 107* 85 97 105*   Lipid Profile: No results for input(s): CHOL, HDL, LDLCALC, TRIG, CHOLHDL, LDLDIRECT in the last 72 hours. Thyroid Function Tests: No results for input(s): TSH, T4TOTAL, FREET4, T3FREE, THYROIDAB in the last 72 hours. Anemia Panel: No results for input(s): VITAMINB12, FOLATE, FERRITIN, TIBC, IRON, RETICCTPCT in the last 72 hours. Sepsis Labs: No results for input(s): PROCALCITON, LATICACIDVEN in the last 168 hours.  Recent Results (from the past 240 hour(s))  Surgical PCR screen     Status: Abnormal   Collection Time: 10/26/19 10:00 AM   Specimen: Nasal Mucosa; Nasal Swab  Result Value Ref Range Status   MRSA, PCR POSITIVE (A) NEGATIVE Final    Comment: RESULT CALLED TO, READ BACK BY AND VERIFIED WITH: RN SUSAN LEE 1247 10/26/19 KB    Staphylococcus aureus POSITIVE (A) NEGATIVE Final    Comment: RESULT CALLED TO, READ BACK BY AND VERIFIED WITH: RN SUSAN LEE 1247 10/26/19 KB (NOTE) The Xpert SA Assay (FDA approved for NASAL specimens in patients 26 years of age and older), is one component of a comprehensive surveillance program. It  is not intended to diagnose infection nor to guide or monitor treatment.   Culture, Urine     Status: None   Collection Time: 10/28/19  1:20 PM   Specimen: Urine, Random  Result Value Ref Range Status   Specimen Description URINE, RANDOM  Final   Special Requests NONE  Final   Culture   Final    NO GROWTH Performed at [facility], [address] [place], [place] [address]    Report Status 10/29/2019 FINAL  Final         Radiology Studies: DG CHEST PORT 1 VIEW  Result Date: 10/29/2019 CLINICAL DATA:  Acute on  chronic respiratory failure with hypoxia. EXAM: PORTABLE CHEST 1 VIEW COMPARISON:  Oct 27, 2019. FINDINGS: Stable cardiomediastinal silhouette. No pneumothorax is noted. Stable left pleural effusion is noted with associated atelectasis or infiltrate. Mild right basilar subsegmental atelectasis is noted. Bony thorax is unremarkable. IMPRESSION: Stable left pleural effusion is noted with associated atelectasis or infiltrate. Mild right basilar subsegmental atelectasis is noted. Electronically Signed   By: Marijo Conception M.D.   On: 10/29/2019 12:28        Scheduled Meds: . allopurinol  300 mg Oral Daily  . aspirin EC  81 mg Oral Daily  . atorvastatin  20 mg Oral q1800  . baclofen  30 mg Oral TID WC  . baclofen  40 mg Oral QHS  . calcium citrate  200 mg of elemental calcium Oral Daily  . carbidopa-levodopa  1 tablet Oral BID  . Chlorhexidine Gluconate Cloth  6 each Topical Daily  . collagenase   Topical Daily  . enoxaparin (LOVENOX) injection  40 mg Subcutaneous Q24H  . fluticasone furoate-vilanterol  1 puff Inhalation Daily  . furosemide  40 mg Oral Daily  . gabapentin  100 mg Oral QHS  . guaiFENesin  1,200 mg Oral BID  . latanoprost  1 drop Both Eyes QHS  . levETIRAcetam  250 mg Oral BID  . levothyroxine  125 mcg Oral QAC breakfast  . metoprolol succinate  50 mg Oral Daily  . multivitamin with minerals  1 tablet Oral Daily  . pantoprazole  40 mg Oral QPM  . polyethylene glycol  17 g Oral Daily  . sodium chloride flush  3 mL Intravenous Q12H  . spironolactone  25 mg Oral Daily  . tamsulosin  0.8 mg Oral QPC supper  . tiZANidine  2 mg Oral TID  . umeclidinium bromide  1 puff Inhalation Daily   Continuous Infusions:    LOS: 3 days     Cordelia Poche, MD [facility] 10/31/2019, 11:46 AM  If 7PM-7AM, please contact night-coverage [email or web address]

## 2019-11-01 LAB — BASIC METABOLIC PANEL
Anion gap: 7 (ref 5–15)
BUN: 20 mg/dL (ref 8–23)
CO2: 34 mmol/L — ABNORMAL HIGH (ref 22–32)
Calcium: 8.9 mg/dL (ref 8.9–10.3)
Chloride: 101 mmol/L (ref 98–111)
Creatinine, Ser: 0.94 mg/dL (ref 0.61–1.24)
GFR calc Af Amer: >60 mL/min (ref >60)
GFR calc non Af Amer: >60 mL/min (ref >60)
Glucose, Bld: 122 mg/dL — ABNORMAL HIGH (ref 70–99)
Potassium: 4 mmol/L (ref 3.5–5.1)
Sodium: 142 mmol/L (ref 135–145)

## 2019-11-01 LAB — CBC
HCT: 39.9 % (ref 39.0–52.0)
Hemoglobin: 12.6 g/dL — ABNORMAL LOW (ref 13.0–17.0)
MCH: 28.3 pg (ref 26.0–34.0)
MCHC: 31.6 g/dL (ref 30.0–36.0)
MCV: 89.5 fL (ref 80.0–100.0)
Platelets: 254 10*3/uL (ref 150–400)
RBC: 4.46 MIL/uL (ref 4.22–5.81)
RDW: 14.6 % (ref 11.5–15.5)
WBC: 10.6 10*3/uL — ABNORMAL HIGH (ref 4.0–10.5)
nRBC: 0 % (ref 0.0–0.2)

## 2019-11-01 LAB — GLUCOSE, CAPILLARY
Glucose-Capillary: 106 mg/dL — ABNORMAL HIGH (ref 70–99)
Glucose-Capillary: 124 mg/dL — ABNORMAL HIGH (ref 70–99)
Glucose-Capillary: 96 mg/dL (ref 70–99)

## 2019-11-01 NOTE — Progress Notes (Signed)
PT PLACED ON cpap FOR THE NIGHT AT PREVIOUS SETTING. tOL WELL

## 2019-11-01 NOTE — Progress Notes (Signed)
PROGRESS NOTE    [name]  [id number] DOB: [birth year] DOA: 10/28/2019 PCP: [name], MD   Brief Narrative: [name] is a 79 y.o. 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 Patient presented from inpatient rehab for worsening LE weakness and spasticity likely secondary to severe lumbar stenosis. Neurosurgery managing.   Assessment & Plan:   Principal Problem:   Incomplete paraplegia (HCC) Active Problems:   OSA on CPAP   Essential hypertension   Thoracic aortic aneurysm (HCC)   Acute on chronic respiratory failure with hypoxia (HCC)    Incomplete paraplegia Acute on chronic nerve root impingement of lumbar spine Associated worsening spasticity. Recent MRI significant for severe spinal stenosis of worst at L3-4 -Continue Baclofen 30 mg TID and 40 mg QHS -PT/OT recommendations: CIR -Neurosurgery recommendations: Plan for surgery on 5/18  Acute on chronic respiratory failure with hypoxia Patient is on 2L via [place] as an outpatient. Patient has needed as much as 4 L. Recent chest x-ray significant for atelectasis but CT from 5/5 significant for large left pleural effusion with collapse of majority of left lung. Patient is declining to use incentive spirometer. Complicated by left hemidiaphragm paresis. Weaned back down to 2 L. -Continue IS and flutter  Ascending aortic aneurysm Patient and patient's wife were unaware of this diagnosis. CTA chest from 2019 significant for 4.2 cm ascending thoracic aortic aneurysm. Recent Transthoracic Echocardiogram (09/18/2019) significant for aortic root measuring 4.0 cm. Patient with poorly controlled blood pressure. History of CT chest consistent with  atherosclerotic heart disease; on simvastatin 40 mg daily as an outpatient -Aggressive blood pressure control -Heart healthy diet -Continue Simvastatin  Hypertensive urgency Essential hypertension Blood pressure has been mostly uncontrolled. Patient with evidence of hypertensive heart disease on Transthoracic Echocardiogram with mild LVH noted. After further chart review, patient was on metoprolol succinate 50 mg daily, furosemide 40 mg daily and spironolactone 25 mg daily. -Continue home metoprolol succinate 50 mg daily, furosemide 40 mg daily and spironolactone 25 mg daily  Hyperlipidemia -Continue simvastatin  Concern for UTI Ruled out. No symptoms. Urine culture negative. Discontinue antibiotics.  Chronic diastolic heart failure Per discussion with patient and wife, patient was previously on Lasix and Spironolactone. -Continue Lasix and spironolactone  Bladder incontinence Bowel incontinence Chronic.  Restless leg syndrome -Continue Sinemet  Myoclonic jerking -Continue Keppra  Hypothyroidism -Continue Synthroid  OSA -Continue CPAP qHS  Constipation Patient is having frequent loose stools -Miralax daily. Will need a bowel regimen with narcotics  Prediabetes Hemoglobin A1C of 5.8%. Patient is on metformin as an outpatient -Discontinue SSI; daily CBG in AM  Obesity Body mass index is 34.77 kg/m.   DVT prophylaxis: Lovenox Code Status:   Code Status: DNR Family Communication: None at bedside Disposition Plan: Discharge likely back to CIR pending neurosurgery recommendations/management post surgery   Consultants:   Neurosurgery  Procedures:   None  Antimicrobials:  Vancomycin  Cefepime    Subjective: No issues this morning. Had an issue with getting his CPAP overnight.  Objective: Vitals:   11/01/19 0811 11/01/19 0859 11/01/19 0931 11/01/19 0932  BP: (!) 141/77     Pulse: 65 72    Resp: (!) 21     Temp: (!) 97.5 F (36.4 C)     TempSrc:  SpO2: 97%  95% 95%  Weight:      Height:        Intake/Output Summary (Last 24 hours) at 11/01/2019 1254 Last data filed at 11/01/2019 0600 Gross per 24 hour  Intake 3 ml  Output 450 ml  Net -447 ml   Filed Weights   10/28/19 1625 10/29/19 0500 10/30/19 0500  Weight: 99.3 kg 100.7 kg 100.7 kg    Examination:  General exam: Appears calm and comfortable Respiratory system: Rhonchi. Respiratory effort normal. Cardiovascular system: S1 & S2 heard, RRR. No murmurs, rubs, gallops or clicks. Gastrointestinal system: Abdomen is nondistended, soft and nontender. No organomegaly or masses felt. Normal bowel sounds heard. Central nervous system: Alert and oriented. No focal neurological deficits. Extremities: No edema. No calf tenderness Skin: No cyanosis. No rashes Psychiatry: Judgement and insight appear normal. Mood & affect appropriate.       Data Reviewed: I have personally reviewed following labs and imaging studies  CBC: Recent Labs  Lab 10/27/19 0500 10/28/19 0422 10/29/19 0625 11/01/19 0522  WBC 15.5* 15.1* 10.2 10.6*  NEUTROABS 12.4* 11.5*  --   --   HGB 11.5* 11.7* 12.2* 12.6*  HCT 36.3* 37.6* 38.8* 39.9  MCV 91.4 91.5 91.1 89.5  PLT 313 306 286 0000000   Basic Metabolic Panel: Recent Labs  Lab 10/28/19 0422 10/29/19 0625 10/30/19 0336 10/31/19 0500 11/01/19 0522  NA 142 143 142 142 142  K 3.6 4.1 3.5 3.3* 4.0  CL 99 101 101 97* 101  CO2 31 30 28  33* 34*  GLUCOSE 97 83 92 102* 122*  BUN 13 15 16 18 20   CREATININE 0.74 0.76 0.86 1.04 0.94  CALCIUM 9.0 9.1 8.9 9.1 8.9   GFR: Estimated Creatinine Clearance: 73.2 mL/min (by C-G formula based on SCr of 0.94 mg/dL). Liver Function Tests: No results for input(s): AST, ALT, ALKPHOS, BILITOT, PROT, ALBUMIN in the last 168 hours. No results for input(s): LIPASE, AMYLASE in the last 168 hours. No results for input(s): AMMONIA in the last 168 hours. Coagulation Profile: No results for input(s): INR, PROTIME in  the last 168 hours. Cardiac Enzymes: No results for input(s): CKTOTAL, CKMB, CKMBINDEX, TROPONINI in the last 168 hours. BNP (last 3 results) Recent Labs    02/14/19 1001  PROBNP 482   HbA1C: No results for input(s): HGBA1C in the last 72 hours. CBG: Recent Labs  Lab 10/30/19 0416 10/30/19 0743 10/30/19 2134 11/01/19 0808 11/01/19 1200  GLUCAP 85 97 105* 106* 124*   Lipid Profile: No results for input(s): CHOL, HDL, LDLCALC, TRIG, CHOLHDL, LDLDIRECT in the last 72 hours. Thyroid Function Tests: No results for input(s): TSH, T4TOTAL, FREET4, T3FREE, THYROIDAB in the last 72 hours. Anemia Panel: No results for input(s): VITAMINB12, FOLATE, FERRITIN, TIBC, IRON, RETICCTPCT in the last 72 hours. Sepsis Labs: No results for input(s): PROCALCITON, LATICACIDVEN in the last 168 hours.  Recent Results (from the past 240 hour(s))  Surgical PCR screen     Status: Abnormal   Collection Time: 10/26/19 10:00 AM   Specimen: Nasal Mucosa; Nasal Swab  Result Value Ref Range Status   MRSA, PCR POSITIVE (A) NEGATIVE Final    Comment: RESULT CALLED TO, READ BACK BY AND VERIFIED WITH: RN SUSAN LEE 1247 10/26/19 KB    Staphylococcus aureus POSITIVE (A) NEGATIVE Final    Comment: RESULT CALLED TO, READ BACK BY AND VERIFIED WITH: RN SUSAN LEE 1247 10/26/19 KB (NOTE) The Xpert SA Assay (FDA approved for NASAL specimens in patients 22  years of age and older), is one component of a comprehensive surveillance program. It is not intended to diagnose infection nor to guide or monitor treatment.   Culture, Urine     Status: None   Collection Time: 10/28/19  1:20 PM   Specimen: Urine, Random  Result Value Ref Range Status   Specimen Description URINE, RANDOM  Final   Special Requests NONE  Final   Culture   Final    NO GROWTH Performed at [facility], [address] [place], [place] [address]    Report Status 10/29/2019 FINAL  Final         Radiology Studies: No results  found.      Scheduled Meds: . allopurinol  300 mg Oral Daily  . aspirin EC  81 mg Oral Daily  . atorvastatin  20 mg Oral q1800  . baclofen  30 mg Oral TID WC  . baclofen  40 mg Oral QHS  . calcium citrate  200 mg of elemental calcium Oral Daily  . carbidopa-levodopa  1 tablet Oral BID  . Chlorhexidine Gluconate Cloth  6 each Topical Daily  . collagenase   Topical Daily  . enoxaparin (LOVENOX) injection  40 mg Subcutaneous Q24H  . fluticasone furoate-vilanterol  1 puff Inhalation Daily  . furosemide  40 mg Oral Daily  . gabapentin  100 mg Oral QHS  . guaiFENesin  1,200 mg Oral BID  . latanoprost  1 drop Both Eyes QHS  . levETIRAcetam  250 mg Oral BID  . levothyroxine  125 mcg Oral QAC breakfast  . metoprolol succinate  50 mg Oral Daily  . multivitamin with minerals  1 tablet Oral Daily  . pantoprazole  40 mg Oral QPM  . polyethylene glycol  17 g Oral Daily  . sodium chloride flush  3 mL Intravenous Q12H  . spironolactone  25 mg Oral Daily  . tamsulosin  0.8 mg Oral QPC supper  . tiZANidine  2 mg Oral TID  . umeclidinium bromide  1 puff Inhalation Daily   Continuous Infusions:    LOS: 4 days     Cordelia Poche, MD [facility] 11/01/2019, 12:54 PM  If 7PM-7AM, please contact night-coverage [email or web address]

## 2019-11-01 NOTE — Progress Notes (Addendum)
Pt calls out to have someone fix his CPAP. This nurse goes in and pt's CPAP mask is unstrapped at the bottom right. This nurse clips mask on and pt states it is better, but that he called an hour ago. This Secretary/administrator. Pt wants to speak to the nursing supervisor and respiratory supervisor. This nurse instructs pt that I am charge nurse. Pt states "I want your supervisor and respiratory supervisor". Respiratory supervisor and nursing supervisor notified.

## 2019-11-02 LAB — GLUCOSE, CAPILLARY: Glucose-Capillary: 119 mg/dL — ABNORMAL HIGH (ref 70–99)

## 2019-11-02 NOTE — Progress Notes (Signed)
Pt placed on CPAP for the night. Tol well ?

## 2019-11-02 NOTE — Progress Notes (Signed)
PROGRESS NOTE    [name]  [id number] DOB: 1941-02-24 DOA: 10/28/2019 PCP: [name], MD   Brief Narrative: [name] is a 79 y.o. 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 Patient presented from inpatient rehab for worsening LE weakness and spasticity likely secondary to severe lumbar stenosis. Neurosurgery managing.   Assessment & Plan:   Principal Problem:   Incomplete paraplegia (HCC) Active Problems:   OSA on CPAP   Essential hypertension   Thoracic aortic aneurysm (HCC)   Acute on chronic respiratory failure with hypoxia (HCC)    Incomplete paraplegia Acute on chronic nerve root impingement of lumbar spine Associated worsening spasticity. Recent MRI significant for severe spinal stenosis of worst at L3-4 -Continue Baclofen 30 mg TID and 40 mg QHS -PT/OT recommendations: CIR -Neurosurgery recommendations: Plan for surgery on 5/18  Acute on chronic respiratory failure with hypoxia Patient is on 2L via [place] as an outpatient. Patient has needed as much as 4 L. Recent chest x-ray significant for atelectasis but CT from 5/5 significant for large left pleural effusion with collapse of majority of left lung. Patient is declining to use incentive spirometer. Complicated by left hemidiaphragm paresis. Weaned back down to 2 L. -Continue IS and flutter  Ascending aortic aneurysm Patient and patient's wife were unaware of this diagnosis. CTA chest from 2019 significant for 4.2 cm ascending thoracic aortic aneurysm. Recent Transthoracic Echocardiogram (09/18/2019) significant for aortic root measuring 4.0 cm. Patient with poorly controlled blood pressure. History of CT chest consistent with  atherosclerotic heart disease; on simvastatin 40 mg daily as an outpatient -Aggressive blood pressure control -Heart healthy diet -Continue Simvastatin  Hypertensive urgency Essential hypertension Blood pressure has been mostly uncontrolled. Patient with evidence of hypertensive heart disease on Transthoracic Echocardiogram with mild LVH noted. After further chart review, patient was on metoprolol succinate 50 mg daily, furosemide 40 mg daily and spironolactone 25 mg daily. Blood pressure improved. -Continue home metoprolol succinate 50 mg daily, furosemide 40 mg daily and spironolactone 25 mg daily  Hyperlipidemia -Continue simvastatin  Concern for UTI Ruled out. No symptoms. Urine culture negative. Discontinue antibiotics.  Chronic diastolic heart failure Per discussion with patient and wife, patient was previously on Lasix and Spironolactone. -Continue Lasix and spironolactone  Bladder incontinence Bowel incontinence Chronic.  Restless leg syndrome -Continue Sinemet  Myoclonic jerking -Continue Keppra  Hypothyroidism -Continue Synthroid  OSA -Continue CPAP qHS  Constipation Patient was having frequent loose stools -Miralax daily. Will need a bowel regimen with narcotics  Prediabetes Hemoglobin A1C of 5.8%. Patient is on metformin as an outpatient -Discontinue SSI; daily CBG in AM  Obesity Body mass index is 34.77 kg/m.   DVT prophylaxis: Lovenox Code Status:   Code Status: DNR Family Communication: None at bedside Disposition Plan: Discharge likely back to CIR pending neurosurgery recommendations/management post surgery   Consultants:   Neurosurgery  Procedures:   None  Antimicrobials:  Vancomycin  Cefepime    Subjective: No issues noted.  Objective: Vitals:   11/01/19 0932 11/01/19 1750 11/01/19 2100 11/02/19 0809  BP:  110/69 137/74 140/68  Pulse:  70 71 67  Resp:  17 20 19   Temp:  97.9 F (36.6 C) 98.4 F (36.9 C) 97.9 F (36.6  C)  TempSrc:  Axillary   SpO2: 95% 98% 91% 92%  Weight:      Height:        Intake/Output Summary (Last 24 hours) at 11/02/2019 1137 Last data filed at 11/02/2019 0600 Gross per 24 hour  Intake 3 ml  Output 1650 ml  Net -1647 ml   Filed Weights   10/28/19 1625 10/29/19 0500 10/30/19 0500  Weight: 99.3 kg 100.7 kg 100.7 kg    Examination:  General: Well appearing, no distress      Data Reviewed: I have personally reviewed following labs and imaging studies  CBC: Recent Labs  Lab 10/27/19 0500 10/28/19 0422 10/29/19 0625 11/01/19 0522  WBC 15.5* 15.1* 10.2 10.6*  NEUTROABS 12.4* 11.5*  --   --   HGB 11.5* 11.7* 12.2* 12.6*  HCT 36.3* 37.6* 38.8* 39.9  MCV 91.4 91.5 91.1 89.5  PLT 313 306 286 0000000   Basic Metabolic Panel: Recent Labs  Lab 10/28/19 0422 10/29/19 0625 10/30/19 0336 10/31/19 0500 11/01/19 0522  NA 142 143 142 142 142  K 3.6 4.1 3.5 3.3* 4.0  CL 99 101 101 97* 101  CO2 31 30 28  33* 34*  GLUCOSE 97 83 92 102* 122*  BUN 13 15 16 18 20   CREATININE 0.74 0.76 0.86 1.04 0.94  CALCIUM 9.0 9.1 8.9 9.1 8.9   GFR: Estimated Creatinine Clearance: 73.2 mL/min (by C-G formula based on SCr of 0.94 mg/dL). Liver Function Tests: No results for input(s): AST, ALT, ALKPHOS, BILITOT, PROT, ALBUMIN in the last 168 hours. No results for input(s): LIPASE, AMYLASE in the last 168 hours. No results for input(s): AMMONIA in the last 168 hours. Coagulation Profile: No results for input(s): INR, PROTIME in the last 168 hours. Cardiac Enzymes: No results for input(s): CKTOTAL, CKMB, CKMBINDEX, TROPONINI in the last 168 hours. BNP (last 3 results) Recent Labs    02/14/19 1001  PROBNP 482   HbA1C: No results for input(s): HGBA1C in the last 72 hours. CBG: Recent Labs  Lab 10/30/19 2134 11/01/19 0808 11/01/19 1200 11/01/19 1636 11/02/19 0730  GLUCAP 105* 106* 124* 96 119*   Lipid Profile: No results for input(s): CHOL, HDL, LDLCALC, TRIG, CHOLHDL,  LDLDIRECT in the last 72 hours. Thyroid Function Tests: No results for input(s): TSH, T4TOTAL, FREET4, T3FREE, THYROIDAB in the last 72 hours. Anemia Panel: No results for input(s): VITAMINB12, FOLATE, FERRITIN, TIBC, IRON, RETICCTPCT in the last 72 hours. Sepsis Labs: No results for input(s): PROCALCITON, LATICACIDVEN in the last 168 hours.  Recent Results (from the past 240 hour(s))  Surgical PCR screen     Status: Abnormal   Collection Time: 10/26/19 10:00 AM   Specimen: Nasal Mucosa; Nasal Swab  Result Value Ref Range Status   MRSA, PCR POSITIVE (A) NEGATIVE Final    Comment: RESULT CALLED TO, READ BACK BY AND VERIFIED WITH: RN SUSAN LEE 1247 10/26/19 KB    Staphylococcus aureus POSITIVE (A) NEGATIVE Final    Comment: RESULT CALLED TO, READ BACK BY AND VERIFIED WITH: RN SUSAN LEE 1247 10/26/19 KB (NOTE) The Xpert SA Assay (FDA approved for NASAL specimens in patients 46 years of age and older), is one component of a comprehensive surveillance program. It is not intended to diagnose infection nor to guide or monitor treatment.   Culture, Urine     Status: None   Collection Time: 10/28/19  1:20 PM   Specimen: Urine, Random  Result Value Ref Range Status   Specimen Description URINE, RANDOM  Final   Special  Requests NONE  Final   Culture   Final    NO GROWTH Performed at [facility], [place] [address] [place], [place] [address]    Report Status 10/29/2019 FINAL  Final         Radiology Studies: No results found.      Scheduled Meds: . allopurinol  300 mg Oral Daily  . aspirin EC  81 mg Oral Daily  . atorvastatin  20 mg Oral q1800  . baclofen  30 mg Oral TID WC  . baclofen  40 mg Oral QHS  . calcium citrate  200 mg of elemental calcium Oral Daily  . carbidopa-levodopa  1 tablet Oral BID  . Chlorhexidine Gluconate Cloth  6 each Topical Daily  . collagenase   Topical Daily  . enoxaparin (LOVENOX) injection  40 mg Subcutaneous Q24H  . fluticasone  furoate-vilanterol  1 puff Inhalation Daily  . furosemide  40 mg Oral Daily  . gabapentin  100 mg Oral QHS  . guaiFENesin  1,200 mg Oral BID  . latanoprost  1 drop Both Eyes QHS  . levETIRAcetam  250 mg Oral BID  . levothyroxine  125 mcg Oral QAC breakfast  . metoprolol succinate  50 mg Oral Daily  . multivitamin with minerals  1 tablet Oral Daily  . pantoprazole  40 mg Oral QPM  . polyethylene glycol  17 g Oral Daily  . sodium chloride flush  3 mL Intravenous Q12H  . spironolactone  25 mg Oral Daily  . tamsulosin  0.8 mg Oral QPC supper  . tiZANidine  2 mg Oral TID  . umeclidinium bromide  1 puff Inhalation Daily   Continuous Infusions:    LOS: 5 days     Cordelia Poche, MD [facility] 11/02/2019, 11:37 AM  If 7PM-7AM, please contact night-coverage [email or web address]

## 2019-11-03 LAB — GLUCOSE, CAPILLARY
Glucose-Capillary: 100 mg/dL — ABNORMAL HIGH (ref 70–99)
Glucose-Capillary: 122 mg/dL — ABNORMAL HIGH (ref 70–99)
Glucose-Capillary: 131 mg/dL — ABNORMAL HIGH (ref 70–99)
Glucose-Capillary: 171 mg/dL — ABNORMAL HIGH (ref 70–99)

## 2019-11-03 MED ORDER — METHOCARBAMOL 500 MG PO TABS
750.0000 mg | ORAL_TABLET | Freq: Three times a day (TID) | ORAL | Status: DC | PRN
  Administered 2019-11-03: 750 mg via ORAL
  Filled 2019-11-03: qty 2

## 2019-11-03 MED ORDER — METHOCARBAMOL 500 MG PO TABS
750.0000 mg | ORAL_TABLET | Freq: Four times a day (QID) | ORAL | Status: DC | PRN
  Administered 2019-11-03 – 2019-11-07 (×6): 750 mg via ORAL
  Filled 2019-11-03 (×7): qty 2

## 2019-11-03 NOTE — Progress Notes (Signed)
Neurosurgery Service Progress Note  Subjective: NAE ON, having continued muscle spasms   Objective: Vitals:   11/02/19 1626 11/02/19 2229 11/03/19 0749 11/03/19 0821  BP: 140/71 (!) 125/54 134/73   Pulse: 62 73 66 76  Resp: 19 20  18   Temp: 98.2 F (36.8 C) 97.7 F (36.5 C) 97.9 F (36.6 C)   TempSrc:  Oral    SpO2: 97% 93% 98% 98%  Weight:      Height:       Temp (24hrs), Avg:97.9 F (36.6 C), Min:97.7 F (36.5 C), Max:98.2 F (36.8 C)  CBC Latest Ref Rng & Units 11/01/2019 10/29/2019 10/28/2019  WBC 4.0 - 10.5 K/uL 10.6(H) 10.2 15.1(H)  Hemoglobin 13.0 - 17.0 g/dL 12.6(L) 12.2(L) 11.7(L)  Hematocrit 39.0 - 52.0 % 39.9 38.8(L) 37.6(L)  Platelets 150 - 400 K/uL 254 286 306   BMP Latest Ref Rng & Units 11/01/2019 10/31/2019 10/30/2019  Glucose 70 - 99 mg/dL 122(H) 102(H) 92  BUN 8 - 23 mg/dL 20 18 16   Creatinine 0.61 - 1.24 mg/dL 0.94 1.04 0.86  BUN/Creat Ratio 10 - 24 - - -  Sodium 135 - 145 mmol/L 142 142 142  Potassium 3.5 - 5.1 mmol/L 4.0 3.3(L) 3.5  Chloride 98 - 111 mmol/L 101 97(L) 101  CO2 22 - 32 mmol/L 34(H) 33(H) 28  Calcium 8.9 - 10.3 mg/dL 8.9 9.1 8.9    Intake/Output Summary (Last 24 hours) at 11/03/2019 1115 Last data filed at 11/03/2019 0930 Gross per 24 hour  Intake 243 ml  Output 1575 ml  Net -1332 ml    Current Facility-Administered Medications:  .  acetaminophen (TYLENOL) tablet 650 mg, 650 mg, Oral, Q6H PRN, Smith, Rondell A, MD .  albuterol (PROVENTIL) (2.5 MG/3ML) 0.083% nebulizer solution 2.5 mg, 2.5 mg, Nebulization, Q6H PRN, Smith, Rondell A, MD .  allopurinol (ZYLOPRIM) tablet 300 mg, 300 mg, Oral, Daily, Smith, Rondell A, MD, 300 mg at 11/03/19 0901 .  aspirin EC tablet 81 mg, 81 mg, Oral, Daily, Tamala Julian, Rondell A, MD, 81 mg at 11/03/19 0901 .  atorvastatin (LIPITOR) tablet 20 mg, 20 mg, Oral, q1800, Mariel Aloe, MD, 20 mg at 11/02/19 1608 .  baclofen (LIORESAL) tablet 30 mg, 30 mg, Oral, TID WC, Smith, Rondell A, MD, 30 mg at 11/03/19  0901 .  baclofen (LIORESAL) tablet 40 mg, 40 mg, Oral, QHS, Smith, Rondell A, MD, 40 mg at 11/02/19 2207 .  calcium citrate (CALCITRATE - dosed in mg elemental calcium) tablet 200 mg of elemental calcium, 200 mg of elemental calcium, Oral, Daily, Smith, Rondell A, MD, 200 mg of elemental calcium at 11/03/19 0903 .  carbidopa-levodopa (SINEMET IR) 10-100 MG per tablet immediate release 1 tablet, 1 tablet, Oral, BID, Fuller Plan A, MD, 1 tablet at 11/03/19 0903 .  Chlorhexidine Gluconate Cloth 2 % PADS 6 each, 6 each, Topical, Daily, Mariel Aloe, MD, 6 each at 11/03/19 [phone] .  collagenase (SANTYL) ointment, , Topical, Daily, Norval Morton, MD, Given at 11/03/19 1001 .  fluticasone furoate-vilanterol (BREO ELLIPTA) 200-25 MCG/INH 1 puff, 1 puff, Inhalation, Daily, Tamala Julian, Rondell A, MD, 1 puff at 11/03/19 0818 .  furosemide (LASIX) tablet 40 mg, 40 mg, Oral, Daily, Mariel Aloe, MD, 40 mg at 11/03/19 0901 .  gabapentin (NEURONTIN) capsule 100 mg, 100 mg, Oral, QHS, Smith, Rondell A, MD, 100 mg at 11/02/19 2206 .  guaiFENesin (MUCINEX) 12 hr tablet 1,200 mg, 1,200 mg, Oral, BID, Smith, Rondell A, MD, 1,200 mg at  11/03/19 0901 .  HYDROcodone-acetaminophen (NORCO/VICODIN) 5-325 MG per tablet 1 tablet, 1 tablet, Oral, Q6H PRN, Opyd, Ilene Qua, MD, 1 tablet at 11/03/19 0103 .  ipratropium-albuterol (DUONEB) 0.5-2.5 (3) MG/3ML nebulizer solution 3 mL, 3 mL, Nebulization, Q4H PRN, Smith, Rondell A, MD .  labetalol (NORMODYNE) injection 10 mg, 10 mg, Intravenous, Q2H PRN, Opyd, Timothy S, MD .  latanoprost (XALATAN) 0.005 % ophthalmic solution 1 drop, 1 drop, Both Eyes, QHS, Smith, Rondell A, MD, 1 drop at 11/02/19 2208 .  levETIRAcetam (KEPPRA) tablet 250 mg, 250 mg, Oral, BID, Smith, Rondell A, MD, 250 mg at 11/03/19 0903 .  levothyroxine (SYNTHROID) tablet 125 mcg, 125 mcg, Oral, QAC breakfast, Fuller Plan A, MD, 125 mcg at 11/03/19 [phone] .  methocarbamol (ROBAXIN) tablet 750 mg, 750 mg, Oral,  Q6H PRN, Mariel Aloe, MD, 750 mg at 11/03/19 0958 .  metoprolol succinate (TOPROL-XL) 24 hr tablet 50 mg, 50 mg, Oral, Daily, Mariel Aloe, MD, 50 mg at 11/03/19 0901 .  multivitamin with minerals tablet 1 tablet, 1 tablet, Oral, Daily, Fuller Plan A, MD, 1 tablet at 11/03/19 0901 .  nitroGLYCERIN (NITROSTAT) SL tablet 0.4 mg, 0.4 mg, Sublingual, Q5 min PRN, Smith, Rondell A, MD .  ondansetron (ZOFRAN) tablet 4 mg, 4 mg, Oral, Q6H PRN **OR** ondansetron (ZOFRAN) injection 4 mg, 4 mg, Intravenous, Q6H PRN, Smith, Rondell A, MD .  pantoprazole (PROTONIX) EC tablet 40 mg, 40 mg, Oral, QPM, Smith, Rondell A, MD, 40 mg at 11/02/19 1609 .  polyethylene glycol (MIRALAX / GLYCOLAX) packet 17 g, 17 g, Oral, Daily, Mariel Aloe, MD, 17 g at 11/02/19 0857 .  sodium chloride flush (NS) 0.9 % injection 3 mL, 3 mL, Intravenous, Q12H, Smith, Rondell A, MD, 3 mL at 11/03/19 0908 .  spironolactone (ALDACTONE) tablet 25 mg, 25 mg, Oral, Daily, Mariel Aloe, MD, 25 mg at 11/03/19 0901 .  SUMAtriptan (IMITREX) tablet 50 mg, 50 mg, Oral, Q2H PRN, Fuller Plan A, MD, 50 mg at 11/03/19 0904 .  tamsulosin (FLOMAX) capsule 0.8 mg, 0.8 mg, Oral, QPC supper, Tamala Julian, Rondell A, MD, 0.8 mg at 11/02/19 1607 .  tiZANidine (ZANAFLEX) tablet 2 mg, 2 mg, Oral, TID, Tamala Julian, Rondell A, MD, 2 mg at 11/03/19 0904 .  traZODone (DESYREL) tablet 50 mg, 50 mg, Oral, QHS PRN, Tamala Julian, Rondell A, MD, 50 mg at 10/30/19 2317 .  umeclidinium bromide (INCRUSE ELLIPTA) 62.5 MCG/INH 1 puff, 1 puff, Inhalation, Daily, Mariel Aloe, MD, 1 puff at 11/03/19 0818   Physical Exam: 4+/5 in BUE, BLE with dec'd sensation and strength 3/5 in most groups with occasional spasms in LLE, R DF/PF/EHL 1/5  Assessment & Plan: 79 y.o. man with previously treated cervical myelopathy and resulting spasticity, now with worsening lower extremity function. 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  -OR tomorrow for L3-4 MIS decompression, keep  NPO after midnight, enoxaparin already held  Judith Part  11/03/19 11:15 AM

## 2019-11-03 NOTE — Progress Notes (Signed)
  On call physician was paged for pt request for nurse to do something about spasms in bilateral lower legs. Scheduled Baclofen was given as ordered and pt c/o 7/10 pain from spasms @ 0103 and Vicodin was given. Pt now reports no relief from medication. Will cont to monitor and awaiting return response from NP [place].

## 2019-11-03 NOTE — Plan of Care (Signed)

## 2019-11-03 NOTE — Progress Notes (Signed)
Physical Therapy Treatment Patient Details Name: [name] MRN: [id number] DOB: [birth year] Today's Date: 11/03/2019    History of Present Illness [name] is a 79 y.o. 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 Brett Fairy of neurology service as well as neurosurgery, myoclonic jerking, bowel bladder incontinence.Discharged to acute care services for surgical intervention L3-4 decompression( pt reports possible sx tomorrow)    PT Comments    Pt was able to progress to sitting EOB today and maintaining balance without UE for short periods of time.  Pt reports may be having sx tomorrow and wanting to hold off on progressing lateral scoots until after sx (PT agrees).  Continues to require increased time for pain control and limiting of spasms with transfers.  Cont to advance as able, but progress may be limited until surgery.    Follow Up Recommendations  CIR     Equipment Recommendations  None recommended by PT    Recommendations for Other Services       Precautions / Restrictions Precautions Precautions: Fall Precaution Comments: L lateral ankle ulcer, L buttock/sacral ulcer    Mobility  Bed Mobility Overal bed mobility: Needs Assistance Bed Mobility: Rolling Rolling: Mod assist;+2 for safety/equipment Sidelying to sit: Mod assist;HOB elevated;+2 for physical assistance       General bed mobility comments: Rolling both directions multiple times for cleaning/pad placement - cues for hand placement and for trying not to twist back for pain control.   Log roll technique for sit/supine  Transfers Overall transfer level: Needs assistance               General transfer comment: Pt unable to tolerate lateral scoot - used maxi move placed at EOB to move to w/c  Ambulation/Gait                 Stairs             Wheelchair Mobility    Modified Rankin (Stroke Patients Only)       Balance Overall balance assessment: Needs assistance Sitting-balance support: No upper extremity supported;Feet supported;Bilateral upper extremity supported Sitting balance-Leahy Scale: Poor Sitting balance - Comments: Pt sat EOB for ~10 mins total.  He was able to balance with no UE support for 30 sec - 70 sec 3 times.                                    Cognition Arousal/Alertness: Awake/alert Behavior During Therapy: WFL for tasks assessed/performed Overall Cognitive Status: Within Functional Limits for tasks assessed                                        Exercises      General Comments General comments (skin integrity, edema, etc.): Pt transferred to tilt in space w/c.  Placed brief on pt due to bowel incontinenance.  Hoyer pad in place under pt and discussed with Chartered certified accountant.      Pertinent Vitals/Pain Pain Assessment: Faces  Faces Pain Scale: Hurts whole lot Pain Location: L lateral ankle, L buttocks/sacrum -when spasms Pain Descriptors / Indicators: Grimacing;Spasm Pain Intervention(s): Limited activity within patient's tolerance;Monitored during session;Repositioned    Home Living                      Prior Function            PT Goals (current goals can now be found in the care plan section) Progress towards PT goals: Progressing toward goals    Frequency    Min 3X/week      PT Plan Current plan remains appropriate    Co-evaluation PT/OT/SLP Co-Evaluation/Treatment: Yes Reason for Co-Treatment: Complexity of the patient's impairments (multi-system  involvement);For patient/therapist safety PT goals addressed during session: Mobility/safety with mobility OT goals addressed during session: ADL's and self-care      AM-PAC PT "6 Clicks" Mobility   Outcome Measure  Help needed turning from your back to your side while in a flat bed without using bedrails?: A Lot Help needed moving from lying on your back to sitting on the side of a flat bed without using bedrails?: A Lot Help needed moving to and from a bed to a chair (including a wheelchair)?: Total Help needed standing up from a chair using your arms (e.g., wheelchair or bedside chair)?: Total Help needed to walk in hospital room?: Total Help needed climbing 3-5 steps with a railing? : Total 6 Click Score: 8    End of Session   Activity Tolerance: Patient tolerated treatment well Patient left: in chair;with call bell/phone within reach;with family/visitor present Nurse Communication: Mobility status;Need for lift equipment PT Visit Diagnosis: Other abnormalities of gait and mobility (R26.89);Muscle weakness (generalized) (M62.81);Other symptoms and signs involving the nervous system (R29.898)     Time: [id number] PT Time Calculation (min) (ACUTE ONLY): 48 min  Charges:  $Therapeutic Activity: 23-37 mins                     Maggie Font, PT Acute Rehab Services Pager [phone] [facility] [phone] [facility] [phone]    Karlton Lemon 11/03/2019, 2:19 PM

## 2019-11-03 NOTE — Progress Notes (Signed)
PROGRESS NOTE    SLAYTER DATA  [id number] DOB: September 17, 1940 DOA: 10/28/2019 PCP: [name], MD   Brief Narrative: [name] is a 79 y.o. 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 Patient presented from inpatient rehab for worsening LE weakness and spasticity likely secondary to severe lumbar stenosis. Neurosurgery managing.   Assessment & Plan:   Principal Problem:   Incomplete paraplegia (HCC) Active Problems:   OSA on CPAP   Essential hypertension   Thoracic aortic aneurysm (HCC)   Acute on chronic respiratory failure with hypoxia (HCC)    Incomplete paraplegia Acute on chronic nerve root impingement of lumbar spine Associated worsening spasticity. Recent MRI significant for severe spinal stenosis of worst at L3-4 -Continue Baclofen 30 mg TID and 40 mg QHS, Tizanidine -PT/OT recommendations: CIR -Neurosurgery recommendations: Plan for surgery on 5/18 -Add Robaxin q6 hours prn  Acute on chronic respiratory failure with hypoxia Patient is on 2L via [place] as an outpatient. Patient has needed as much as 4 L. Recent chest x-ray significant for atelectasis but CT from 5/5 significant for large left pleural effusion with collapse of majority of left lung. Patient is declining to use incentive spirometer. Complicated by left hemidiaphragm paresis. Weaned back down to 2 L. -Continue IS and flutter  Ascending aortic aneurysm Patient and patient's wife were unaware of this diagnosis. CTA chest from 2019 significant for 4.2 cm ascending thoracic aortic aneurysm. Recent Transthoracic Echocardiogram (09/18/2019) significant for aortic root measuring 4.0 cm. Patient with poorly controlled blood pressure.  History of CT chest consistent with atherosclerotic heart disease; on simvastatin 40 mg daily as an outpatient -Aggressive blood pressure control -Heart healthy diet -Continue Simvastatin  Hypertensive urgency Essential hypertension Blood pressure has been mostly uncontrolled. Patient with evidence of hypertensive heart disease on Transthoracic Echocardiogram with mild LVH noted. After further chart review, patient was on metoprolol succinate 50 mg daily, furosemide 40 mg daily and spironolactone 25 mg daily. Blood pressure improved. -Continue home metoprolol succinate 50 mg daily, furosemide 40 mg daily and spironolactone 25 mg daily  Hyperlipidemia -Continue simvastatin  Concern for UTI Ruled out. No symptoms. Urine culture negative. Discontinue antibiotics.  Chronic diastolic heart failure Per discussion with patient and wife, patient was previously on Lasix and Spironolactone. -Continue Lasix and spironolactone  Bladder incontinence Bowel incontinence Chronic.  Restless leg syndrome -Continue Sinemet  Myoclonic jerking -Continue Keppra  Hypothyroidism -Continue Synthroid  OSA -Continue CPAP qHS  Constipation Patient was having frequent loose stools -Miralax daily. Will need a bowel regimen with narcotics  Prediabetes Hemoglobin A1C of 5.8%. Patient is on metformin as an outpatient -Discontinue SSI; daily CBG in AM  Obesity Body mass index is 34.77 kg/m.   DVT prophylaxis: Lovenox discontinued 5/17 secondary to neurosurgery recommendations. Will need resumption of DVT prophylaxis per neurosurgery recommendations post-op Code Status:   Code Status: DNR Family Communication: None at bedside Disposition Plan: Discharge likely back to CIR pending neurosurgery recommendations/management post surgery   Consultants:   Neurosurgery  Procedures:   None  Antimicrobials:  Vancomycin  Cefepime    Subjective: Worsening spasms.  Objective: Vitals:    11/02/19 1626 11/02/19 2229 11/03/19 0749 11/03/19 0821  BP: 140/71 (!) 125/54 134/73   Pulse: 62 73 66 76  Resp: 19  20  18  Temp: 98.2 F (36.8 C) 97.7 F (36.5 C) 97.9 F (36.6 C)   TempSrc:  Oral    SpO2: 97% 93% 98% 98%  Weight:      Height:        Intake/Output Summary (Last 24 hours) at 11/03/2019 1224 Last data filed at 11/03/2019 0930 Gross per 24 hour  Intake 243 ml  Output 1150 ml  Net -907 ml   Filed Weights   10/28/19 1625 10/29/19 0500 10/30/19 0500  Weight: 99.3 kg 100.7 kg 100.7 kg    Examination:  General exam: Appears calm and comfortable Respiratory system: Clear to auscultation. Respiratory effort normal. Cardiovascular system: S1 & S2 heard, RRR. No murmurs, rubs, gallops or clicks. Gastrointestinal system: Abdomen is nondistended, soft and nontender. No organomegaly or masses felt. Normal bowel sounds heard. Central nervous system: Alert and oriented. Frequent muscle spasms of bilateral thighs Musculoskeletal: No edema. No calf tenderness.  Skin: No cyanosis. No rashes Psychiatry: Judgement and insight appear normal. Mood & affect appropriate.    Data Reviewed: I have personally reviewed following labs and imaging studies  CBC: Recent Labs  Lab 10/28/19 0422 10/29/19 0625 11/01/19 0522  WBC 15.1* 10.2 10.6*  NEUTROABS 11.5*  --   --   HGB 11.7* 12.2* 12.6*  HCT 37.6* 38.8* 39.9  MCV 91.5 91.1 89.5  PLT 306 286 0000000   Basic Metabolic Panel: Recent Labs  Lab 10/28/19 0422 10/29/19 0625 10/30/19 0336 10/31/19 0500 11/01/19 0522  NA 142 143 142 142 142  K 3.6 4.1 3.5 3.3* 4.0  CL 99 101 101 97* 101  CO2 31 30 28  33* 34*  GLUCOSE 97 83 92 102* 122*  BUN 13 15 16 18 20   CREATININE 0.74 0.76 0.86 1.04 0.94  CALCIUM 9.0 9.1 8.9 9.1 8.9   GFR: Estimated Creatinine Clearance: 73.2 mL/min (by C-G formula based on SCr of 0.94 mg/dL). Liver Function Tests: No results for input(s): AST, ALT, ALKPHOS, BILITOT, PROT, ALBUMIN in the last 168  hours. No results for input(s): LIPASE, AMYLASE in the last 168 hours. No results for input(s): AMMONIA in the last 168 hours. Coagulation Profile: No results for input(s): INR, PROTIME in the last 168 hours. Cardiac Enzymes: No results for input(s): CKTOTAL, CKMB, CKMBINDEX, TROPONINI in the last 168 hours. BNP (last 3 results) Recent Labs    02/14/19 1001  PROBNP 482   HbA1C: No results for input(s): HGBA1C in the last 72 hours. CBG: Recent Labs  Lab 11/01/19 1200 11/01/19 1636 11/02/19 0730 11/03/19 0750 11/03/19 1147  GLUCAP 124* 96 119* 100* 122*   Lipid Profile: No results for input(s): CHOL, HDL, LDLCALC, TRIG, CHOLHDL, LDLDIRECT in the last 72 hours. Thyroid Function Tests: No results for input(s): TSH, T4TOTAL, FREET4, T3FREE, THYROIDAB in the last 72 hours. Anemia Panel: No results for input(s): VITAMINB12, FOLATE, FERRITIN, TIBC, IRON, RETICCTPCT in the last 72 hours. Sepsis Labs: No results for input(s): PROCALCITON, LATICACIDVEN in the last 168 hours.  Recent Results (from the past 240 hour(s))  Surgical PCR screen     Status: Abnormal   Collection Time: 10/26/19 10:00 AM   Specimen: Nasal Mucosa; Nasal Swab  Result Value Ref Range Status   MRSA, PCR POSITIVE (A) NEGATIVE Final    Comment: RESULT CALLED TO, READ BACK BY AND VERIFIED WITH: RN SUSAN LEE [id number] 10/26/19 KB    Staphylococcus aureus POSITIVE (A) NEGATIVE Final    Comment: RESULT CALLED TO, READ BACK BY AND VERIFIED WITH:  RN SUSAN LEE [id number] 10/26/19 KB (NOTE) The Xpert SA Assay (FDA approved for NASAL specimens in patients 72 years of age and older), is one component of a comprehensive surveillance program. It is not intended to diagnose infection nor to guide or monitor treatment.   Culture, Urine     Status: None   Collection Time: 10/28/19  1:20 PM   Specimen: Urine, Random  Result Value Ref Range Status   Specimen Description URINE, RANDOM  Final   Special Requests NONE  Final   Culture    Final    NO GROWTH Performed at [facility], [address] [place], [place] [address]    Report Status 10/29/2019 FINAL  Final         Radiology Studies: No results found.      Scheduled Meds: . allopurinol  300 mg Oral Daily  . aspirin EC  81 mg Oral Daily  . atorvastatin  20 mg Oral q1800  . baclofen  30 mg Oral TID WC  . baclofen  40 mg Oral QHS  . calcium citrate  200 mg of elemental calcium Oral Daily  . carbidopa-levodopa  1 tablet Oral BID  . Chlorhexidine Gluconate Cloth  6 each Topical Daily  . collagenase   Topical Daily  . fluticasone furoate-vilanterol  1 puff Inhalation Daily  . furosemide  40 mg Oral Daily  . gabapentin  100 mg Oral QHS  . guaiFENesin  1,200 mg Oral BID  . latanoprost  1 drop Both Eyes QHS  . levETIRAcetam  250 mg Oral BID  . levothyroxine  125 mcg Oral QAC breakfast  . metoprolol succinate  50 mg Oral Daily  . multivitamin with minerals  1 tablet Oral Daily  . pantoprazole  40 mg Oral QPM  . polyethylene glycol  17 g Oral Daily  . sodium chloride flush  3 mL Intravenous Q12H  . spironolactone  25 mg Oral Daily  . tamsulosin  0.8 mg Oral QPC supper  . tiZANidine  2 mg Oral TID  . umeclidinium bromide  1 puff Inhalation Daily   Continuous Infusions:    LOS: 6 days     Cordelia Poche, MD [facility] 11/03/2019, 12:24 PM  If 7PM-7AM, please contact night-coverage [email or web address]

## 2019-11-03 NOTE — Progress Notes (Signed)
Pt placed on cpap with 2lt bleedin FIO2

## 2019-11-03 NOTE — Progress Notes (Signed)
Patient has been using his flutter valve and is remaining on 2L [place].  Patient has SOB when moving around in the bed and using any exertion.

## 2019-11-03 NOTE — Progress Notes (Signed)
Occupational Therapy Treatment Patient Details Name: [name] MRN: [id number] DOB: 05-09-1941 Today's Date: 11/03/2019    History of present illness [name] is a 79 y.o. 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 Brett Fairy of neurology service as well as neurosurgery, myoclonic jerking, bowel bladder incontinence.Discharged to acute care services for surgical intervention L3-4 decompression( pt reports possible sx tomorrow)   OT comments  Pt troubled by LE muscle spasms, but readily willing to work with therapies on bed mobility for pericare and LB dressing, sitting balance at EOB x 10 minutes. Transferred to tilt in space w/c with maximove and set up to eat his lunch. Pt on 3L 02 with stable VS.   Follow Up Recommendations  CIR;Supervision/Assistance - 24 hour    Equipment Recommendations  None recommended by OT    Recommendations for Other Services      Precautions / Restrictions Precautions Precautions: Fall Precaution Comments: L lateral ankle ulcer, L buttock/sacral ulcer       Mobility Bed Mobility Overal bed mobility: Needs Assistance Bed Mobility: Rolling;Sidelying to Sit Rolling: Mod assist;+2 for safety/equipment Sidelying to sit: Mod assist;HOB elevated;+2 for physical assistance       General bed mobility comments: Rolling both directions multiple times for cleaning/pad placement - cues for hand placement and for trying not to twist back for pain control.  Log roll technique for sit/supine  Transfers Overall transfer level:  Needs assistance               General transfer comment: Pt unable to tolerate lateral scoot - used maxi move placed at EOB to move to w/c    Balance Overall balance assessment: Needs assistance Sitting-balance support: No upper extremity supported;Feet supported;Bilateral upper extremity supported Sitting balance-Leahy Scale: Poor Sitting balance - Comments: responds well to pressure place on his knees when attempting to sit unsupported for stability and spasm reduction. Pt able to sit up to 1 min, 10 secs without support statically                                   ADL either performed or assessed with clinical judgement   ADL Overall ADL's : Needs assistance/impaired Eating/Feeding: Set up;Sitting Eating/Feeding Details (indicate cue type and reason): in w/c                 Lower Body Dressing: Total assistance;Bed level Lower Body Dressing Details (indicate cue type and reason): diaper     Toileting- Clothing Manipulation and Hygiene: Total assistance;Bed level Toileting - Clothing Manipulation Details (indicate cue type and reason): pt with bowel incontinence, provided pericare and replaced sacral dressing             Vision       Perception     Praxis      Cognition Arousal/Alertness: Awake/alert Behavior During Therapy: WFL for tasks assessed/performed Overall Cognitive Status: Within Functional Limits for tasks assessed  General Comments: pt directs his care very well        Exercises     Shoulder Instructions       General Comments Pt transferred to tilt in space w/c.  Placed brief on pt due to bowel incontinenance.  Hoyer pad in place under pt and discussed with Chartered certified accountant.    Pertinent Vitals/ Pain       Pain Assessment: Faces Faces Pain Scale: Hurts whole lot Pain Location: L lateral ankle, L buttocks/sacrum -when spasms Pain Descriptors / Indicators: Grimacing;Spasm Pain  Intervention(s): Monitored during session;Repositioned  Home Living                                          Prior Functioning/Environment              Frequency  Min 2X/week        Progress Toward Goals  OT Goals(current goals can now be found in the care plan section)  Progress towards OT goals: Progressing toward goals  Acute Rehab OT Goals Patient Stated Goal: go home OT Goal Formulation: With patient/family Time For Goal Achievement: 11/12/19 Potential to Achieve Goals: Good  Plan Discharge plan remains appropriate;Frequency remains appropriate    Co-evaluation    PT/OT/SLP Co-Evaluation/Treatment: Yes Reason for Co-Treatment: Complexity of the patient's impairments (multi-system involvement);For patient/therapist safety PT goals addressed during session: Mobility/safety with mobility OT goals addressed during session: Strengthening/ROM      AM-PAC OT "6 Clicks" Daily Activity     Outcome Measure   Help from another person eating meals?: A Little Help from another person taking care of personal grooming?: A Little Help from another person toileting, which includes using toliet, bedpan, or urinal?: Total Help from another person bathing (including washing, rinsing, drying)?: Total Help from another person to put on and taking off regular upper body clothing?: A Lot Help from another person to put on and taking off regular lower body clothing?: Total 6 Click Score: 11    End of Session    OT Visit Diagnosis: Muscle weakness (generalized) (M62.81);Pain   Activity Tolerance Patient tolerated treatment well   Patient Left Other (comment);with call bell/phone within reach;with family/visitor present(in w/c)   Nurse Communication Mobility status;Need for lift equipment        Time: [phone] OT Time Calculation (min): 58 min  Charges: OT General Charges $OT Visit: 1 Visit OT Treatments $Self Care/Home Management : 8-22  mins $Therapeutic Activity: 8-22 mins  Nestor Lewandowsky, OTR/L Acute Rehabilitation Services Pager: [phone] Office: [phone]   Malka So 11/03/2019, 3:47 PM

## 2019-11-04 ENCOUNTER — Inpatient Hospital Stay (HOSPITAL_COMMUNITY): Payer: Medicare Other | Admitting: Certified Registered"

## 2019-11-04 ENCOUNTER — Encounter (HOSPITAL_COMMUNITY): Payer: Self-pay | Admitting: Internal Medicine

## 2019-11-04 ENCOUNTER — Inpatient Hospital Stay: Payer: Medicare Other | Admitting: Primary Care

## 2019-11-04 ENCOUNTER — Encounter (HOSPITAL_COMMUNITY)
Admission: AD | Disposition: A | Discharge status: Rehabilitation Facility | Payer: Self-pay | Source: Ambulatory Visit | Attending: Family Medicine

## 2019-11-04 ENCOUNTER — Inpatient Hospital Stay (HOSPITAL_COMMUNITY): Payer: Medicare Other

## 2019-11-04 HISTORY — PX: LUMBAR LAMINECTOMY/ DECOMPRESSION WITH MET-RX: SHX5959

## 2019-11-04 LAB — CBC
HCT: 39.1 % (ref 39.0–52.0)
Hemoglobin: 12.2 g/dL — ABNORMAL LOW (ref 13.0–17.0)
MCH: 28.4 pg (ref 26.0–34.0)
MCHC: 31.2 g/dL (ref 30.0–36.0)
MCV: 91.1 fL (ref 80.0–100.0)
Platelets: 210 10*3/uL (ref 150–400)
RBC: 4.29 MIL/uL (ref 4.22–5.81)
RDW: 14.7 % (ref 11.5–15.5)
WBC: 18.5 10*3/uL — ABNORMAL HIGH (ref 4.0–10.5)
nRBC: 0 % (ref 0.0–0.2)

## 2019-11-04 LAB — BASIC METABOLIC PANEL
Anion gap: 9 (ref 5–15)
BUN: 21 mg/dL (ref 8–23)
CO2: 34 mmol/L — ABNORMAL HIGH (ref 22–32)
Calcium: 9 mg/dL (ref 8.9–10.3)
Chloride: 98 mmol/L (ref 98–111)
Creatinine, Ser: 0.89 mg/dL (ref 0.61–1.24)
GFR calc Af Amer: >60 mL/min (ref >60)
GFR calc non Af Amer: >60 mL/min (ref >60)
Glucose, Bld: 105 mg/dL — ABNORMAL HIGH (ref 70–99)
Potassium: 4 mmol/L (ref 3.5–5.1)
Sodium: 141 mmol/L (ref 135–145)

## 2019-11-04 LAB — GLUCOSE, CAPILLARY
Glucose-Capillary: 94 mg/dL (ref 70–99)
Glucose-Capillary: 93 mg/dL (ref 70–99)
Glucose-Capillary: 89 mg/dL (ref 70–99)
Glucose-Capillary: 127 mg/dL — ABNORMAL HIGH (ref 70–99)
Glucose-Capillary: 142 mg/dL — ABNORMAL HIGH (ref 70–99)
Glucose-Capillary: 140 mg/dL — ABNORMAL HIGH (ref 70–99)

## 2019-11-04 IMAGING — RF DG C-ARM 1-60 MIN
1 series · 1 of 1 positions shown · IV contrast (agent unspecified)
Comparison: CT lumbar spine [DATE].

CLINICAL DATA: L3-L4 laminectomy.

EXAM:
DG C-ARM 1-60 MIN
CONTRAST:  None.
FLUOROSCOPY TIME:  Fluoroscopy Time:  0 minutes 9 seconds
Number of Acquired Spot Images: 1

[Series 1: run · 1 of 1 slices shown]
[im 1/1]
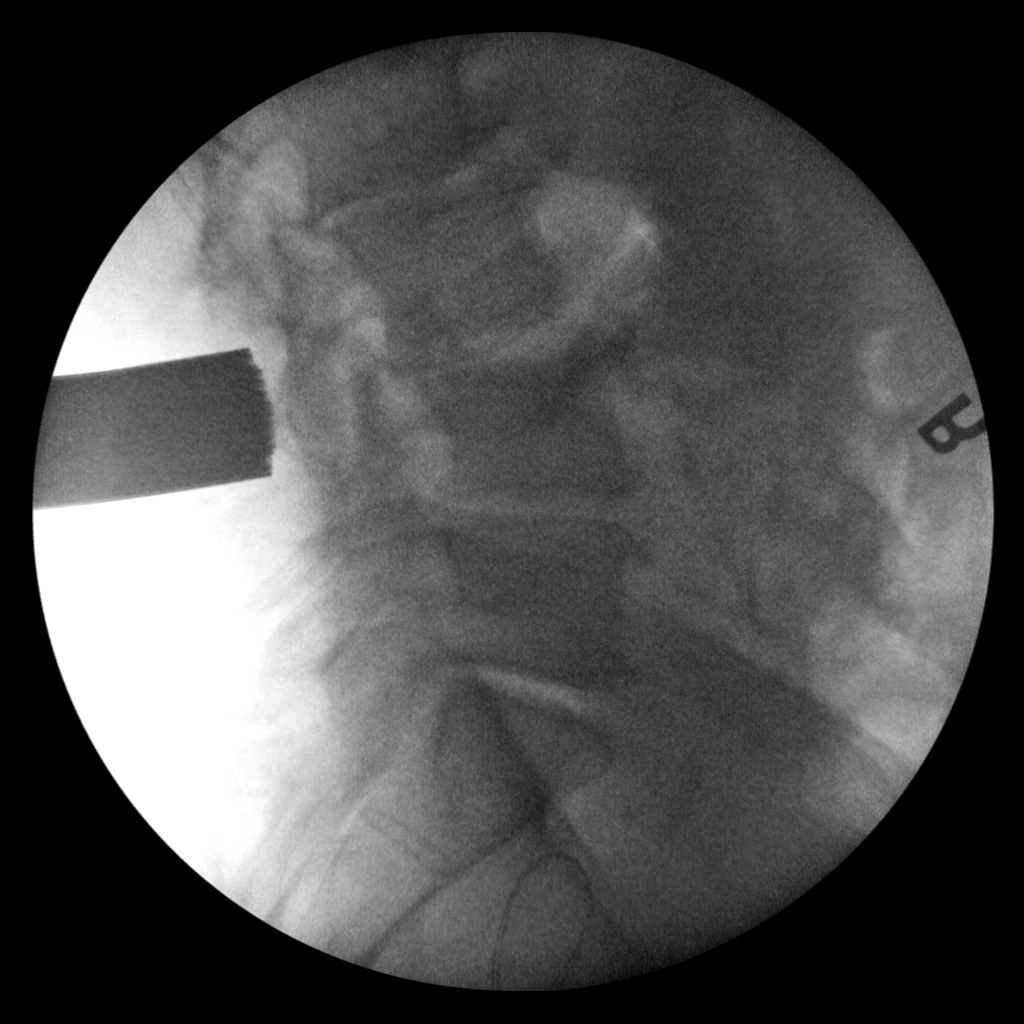

[1 of 1 positions shown; findings below may reference images not displayed]

FINDINGS: Lumbar spine numbered the lowest segmented appearing lumbar shaped
vertebrae on lateral view as L5. Metallic surgical instrument noted
posterior to the level of L4.
IMPRESSION: Postsurgical changes lumbar spine.

## 2019-11-04 IMAGING — RF DG LUMBAR SPINE 1V
1 series · 1 of 1 positions shown · non-contrast
Comparison: CT [DATE].

CLINICAL DATA: Lumbar surgery.

EXAM:
LUMBAR SPINE - 1 VIEW

[Series 1: run · 1 of 1 slices shown]
[im 1/1]
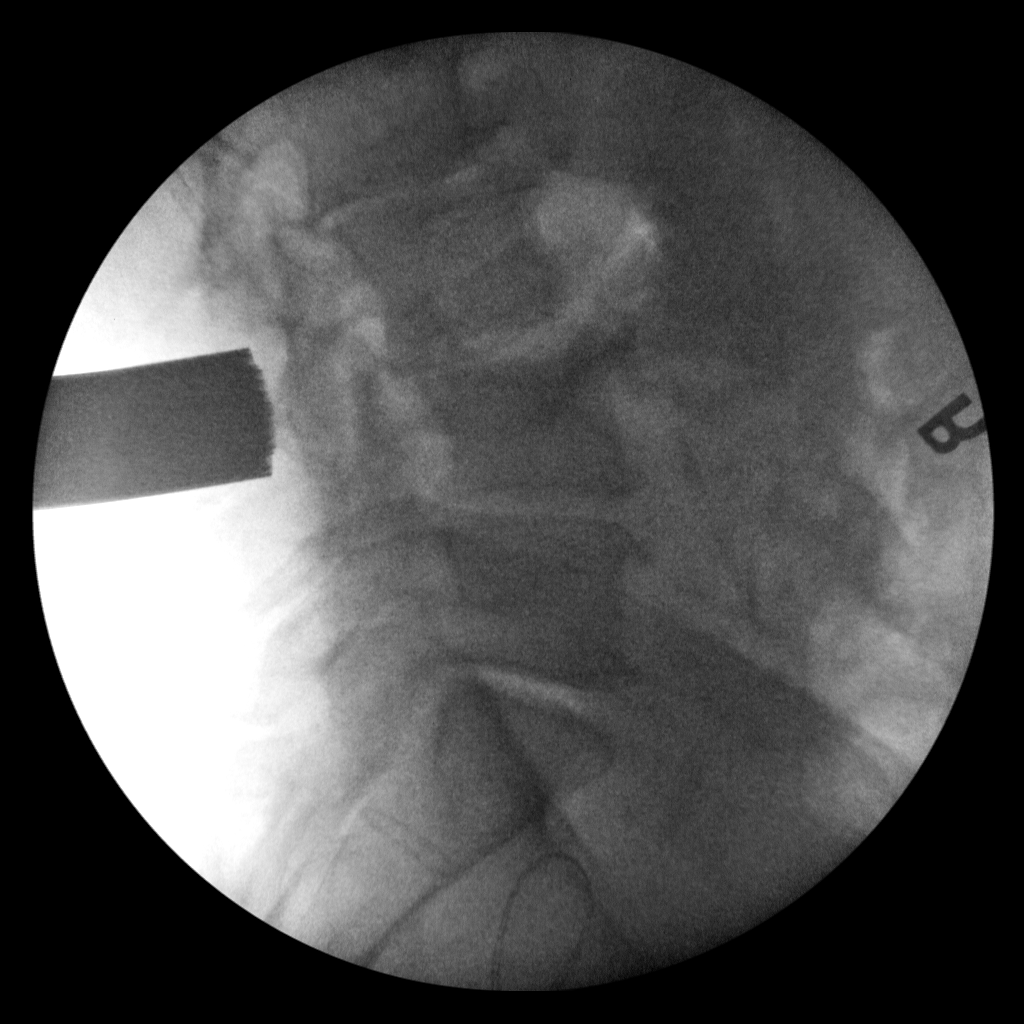

[1 of 1 positions shown; findings below may reference images not displayed]

FINDINGS: Lumbar spine numbered the lowest segmented appearing lumbar shaped
vertebrae on lateral view as L5. Surgical instrument is noted
posteriorly at the level of L4.
IMPRESSION: Postsurgical changes lumbar spine.

## 2019-11-04 SURGERY — LUMBAR LAMINECTOMY/ DECOMPRESSION WITH MET-RX
Anesthesia: General | Site: Back

## 2019-11-04 MED ORDER — EPHEDRINE SULFATE-NACL 50-0.9 MG/10ML-% IV SOSY
PREFILLED_SYRINGE | INTRAVENOUS | Status: DC | PRN
  Administered 2019-11-04: 5 mg via INTRAVENOUS

## 2019-11-04 MED ORDER — OXYCODONE HCL 5 MG PO TABS
ORAL_TABLET | ORAL | Status: AC
  Filled 2019-11-04: qty 1

## 2019-11-04 MED ORDER — THROMBIN 5000 UNITS EX SOLR: OROMUCOSAL | Status: DC | PRN

## 2019-11-04 MED ORDER — DEXAMETHASONE SODIUM PHOSPHATE 10 MG/ML IJ SOLN
INTRAMUSCULAR | Status: DC | PRN
  Administered 2019-11-04: 10 mg via INTRAVENOUS

## 2019-11-04 MED ORDER — ACETAMINOPHEN 500 MG PO TABS: 1000.0000 mg | ORAL_TABLET | Freq: Once | ORAL | Status: DC | PRN

## 2019-11-04 MED ORDER — ACETAMINOPHEN 10 MG/ML IV SOLN
1000.0000 mg | Freq: Once | INTRAVENOUS | Status: DC | PRN
  Administered 2019-11-04: 1000 mg via INTRAVENOUS

## 2019-11-04 MED ORDER — PHENYLEPHRINE HCL-NACL 10-0.9 MG/250ML-% IV SOLN
INTRAVENOUS | Status: DC | PRN
  Administered 2019-11-04: 40 ug/min via INTRAVENOUS

## 2019-11-04 MED ORDER — PROPOFOL 10 MG/ML IV BOLUS
INTRAVENOUS | Status: AC
  Filled 2019-11-04: qty 20

## 2019-11-04 MED ORDER — ROCURONIUM BROMIDE 100 MG/10ML IV SOLN
INTRAVENOUS | Status: DC | PRN
  Administered 2019-11-04: 50 mg via INTRAVENOUS
  Administered 2019-11-04: 20 mg via INTRAVENOUS

## 2019-11-04 MED ORDER — LIDOCAINE-EPINEPHRINE 1 %-1:100000 IJ SOLN
INTRAMUSCULAR | Status: DC | PRN
  Administered 2019-11-04: 7 mL

## 2019-11-04 MED ORDER — OXYCODONE HCL 5 MG PO TABS
5.0000 mg | ORAL_TABLET | Freq: Once | ORAL | Status: AC | PRN
  Administered 2019-11-04: 5 mg via ORAL

## 2019-11-04 MED ORDER — CEFAZOLIN SODIUM-DEXTROSE 2-4 GM/100ML-% IV SOLN
2.0000 g | Freq: Once | INTRAVENOUS | Status: AC
  Administered 2019-11-04: 2 g via INTRAVENOUS

## 2019-11-04 MED ORDER — THROMBIN 5000 UNITS EX SOLR
CUTANEOUS | Status: AC
  Filled 2019-11-04: qty 5000

## 2019-11-04 MED ORDER — ALBUMIN HUMAN 5 % IV SOLN: INTRAVENOUS | Status: DC | PRN

## 2019-11-04 MED ORDER — PROPOFOL 10 MG/ML IV BOLUS
INTRAVENOUS | Status: DC | PRN
  Administered 2019-11-04: 40 mg via INTRAVENOUS
  Administered 2019-11-04: 100 mg via INTRAVENOUS

## 2019-11-04 MED ORDER — LIDOCAINE-EPINEPHRINE 1 %-1:100000 IJ SOLN
INTRAMUSCULAR | Status: AC
  Filled 2019-11-04: qty 1

## 2019-11-04 MED ORDER — METHOCARBAMOL 1000 MG/10ML IJ SOLN
500.0000 mg | Freq: Once | INTRAVENOUS | Status: DC
  Filled 2019-11-04: qty 5

## 2019-11-04 MED ORDER — ONDANSETRON HCL 4 MG/2ML IJ SOLN
INTRAMUSCULAR | Status: AC
  Filled 2019-11-04: qty 2

## 2019-11-04 MED ORDER — LEVOTHYROXINE SODIUM 100 MCG/5ML IV SOLN
100.0000 ug | Freq: Once | INTRAVENOUS | Status: AC
  Administered 2019-11-04: 100 ug via INTRAVENOUS
  Filled 2019-11-04 (×2): qty 5

## 2019-11-04 MED ORDER — LACTATED RINGERS IV SOLN: INTRAVENOUS | Status: DC

## 2019-11-04 MED ORDER — ONDANSETRON HCL 4 MG/2ML IJ SOLN
INTRAMUSCULAR | Status: DC | PRN
  Administered 2019-11-04: 4 mg via INTRAVENOUS

## 2019-11-04 MED ORDER — ACETAMINOPHEN 10 MG/ML IV SOLN
INTRAVENOUS | Status: AC
  Filled 2019-11-04: qty 100

## 2019-11-04 MED ORDER — FENTANYL CITRATE (PF) 100 MCG/2ML IJ SOLN
INTRAMUSCULAR | Status: DC | PRN
  Administered 2019-11-04 (×2): 50 ug via INTRAVENOUS

## 2019-11-04 MED ORDER — FENTANYL CITRATE (PF) 100 MCG/2ML IJ SOLN: 25.0000 ug | INTRAMUSCULAR | Status: DC | PRN

## 2019-11-04 MED ORDER — LIDOCAINE 2% (20 MG/ML) 5 ML SYRINGE
INTRAMUSCULAR | Status: AC
  Filled 2019-11-04: qty 5

## 2019-11-04 MED ORDER — SIMETHICONE 80 MG PO CHEW
80.0000 mg | CHEWABLE_TABLET | Freq: Once | ORAL | Status: AC
  Administered 2019-11-04: 80 mg via ORAL
  Filled 2019-11-04: qty 1

## 2019-11-04 MED ORDER — SUGAMMADEX SODIUM 200 MG/2ML IV SOLN
INTRAVENOUS | Status: DC | PRN
  Administered 2019-11-04: 200 mg via INTRAVENOUS

## 2019-11-04 MED ORDER — CEFAZOLIN SODIUM-DEXTROSE 2-4 GM/100ML-% IV SOLN
INTRAVENOUS | Status: AC
  Filled 2019-11-04: qty 100

## 2019-11-04 MED ORDER — 0.9 % SODIUM CHLORIDE (POUR BTL) OPTIME
TOPICAL | Status: DC | PRN
  Administered 2019-11-04: 1000 mL

## 2019-11-04 MED ORDER — FENTANYL CITRATE (PF) 250 MCG/5ML IJ SOLN
INTRAMUSCULAR | Status: AC
  Filled 2019-11-04: qty 5

## 2019-11-04 MED ORDER — PHENYLEPHRINE 40 MCG/ML (10ML) SYRINGE FOR IV PUSH (FOR BLOOD PRESSURE SUPPORT)
PREFILLED_SYRINGE | INTRAVENOUS | Status: DC | PRN
  Administered 2019-11-04 (×3): 80 ug via INTRAVENOUS
  Administered 2019-11-04: 40 ug via INTRAVENOUS
  Administered 2019-11-04: 120 ug via INTRAVENOUS

## 2019-11-04 MED ORDER — ACETAMINOPHEN 160 MG/5ML PO SOLN: 1000.0000 mg | Freq: Once | ORAL | Status: DC | PRN

## 2019-11-04 MED ORDER — LEVOTHYROXINE SODIUM 25 MCG PO TABS
125.0000 ug | ORAL_TABLET | Freq: Every day | ORAL | Status: DC
  Administered 2019-11-05 – 2019-11-10 (×6): 125 ug via ORAL
  Filled 2019-11-04 (×6): qty 1

## 2019-11-04 MED ORDER — DEXAMETHASONE SODIUM PHOSPHATE 10 MG/ML IJ SOLN
INTRAMUSCULAR | Status: AC
  Filled 2019-11-04: qty 1

## 2019-11-04 MED ORDER — ROCURONIUM BROMIDE 10 MG/ML (PF) SYRINGE
PREFILLED_SYRINGE | INTRAVENOUS | Status: AC
  Filled 2019-11-04: qty 10

## 2019-11-04 MED ORDER — OXYCODONE HCL 5 MG/5ML PO SOLN: 5.0000 mg | Freq: Once | ORAL | Status: AC | PRN

## 2019-11-04 MED ORDER — SODIUM CHLORIDE 0.9 % IV SOLN: INTRAVENOUS | Status: DC | PRN

## 2019-11-04 SURGICAL SUPPLY — 51 items
BAG DECANTER FOR FLEXI CONT (MISCELLANEOUS) ×2 IMPLANT
BAND RUBBER #18 3X1/16 STRL (MISCELLANEOUS) ×4 IMPLANT
BLADE CLIPPER SURG (BLADE) IMPLANT
BLADE SURG 11 STRL SS (BLADE) ×2 IMPLANT
BUR MATCHSTICK NEURO 3.0 LAGG (BURR) IMPLANT
BUR PRECISION FLUTE 5.0 (BURR) IMPLANT
BUR PRECISION MATCH 3.0 13 (BURR) ×2 IMPLANT
CANISTER SUCT 3000ML PPV (MISCELLANEOUS) ×2 IMPLANT
COVER WAND RF STERILE (DRAPES) IMPLANT
DECANTER SPIKE VIAL GLASS SM (MISCELLANEOUS) ×2 IMPLANT
DERMABOND ADVANCED (GAUZE/BANDAGES/DRESSINGS) ×1
DERMABOND ADVANCED .7 DNX12 (GAUZE/BANDAGES/DRESSINGS) ×1 IMPLANT
DRAPE C-ARM 42X72 X-RAY (DRAPES) ×4 IMPLANT
DRAPE LAPAROTOMY 100X72X124 (DRAPES) ×2 IMPLANT
DRAPE MICROSCOPE LEICA (MISCELLANEOUS) ×2 IMPLANT
DRAPE SURG 17X23 STRL (DRAPES) IMPLANT
DURAPREP 26ML APPLICATOR (WOUND CARE) ×2 IMPLANT
ELECT BLADE 6.5 EXT (BLADE) ×2 IMPLANT
ELECT REM PT RETURN 9FT ADLT (ELECTROSURGICAL) ×2
ELECTRODE REM PT RTRN 9FT ADLT (ELECTROSURGICAL) ×1 IMPLANT
GAUZE 4X4 16PLY RFD (DISPOSABLE) IMPLANT
GAUZE SPONGE 4X4 12PLY STRL (GAUZE/BANDAGES/DRESSINGS) IMPLANT
GLOVE BIO SURGEON STRL SZ7.5 (GLOVE) ×4 IMPLANT
GLOVE BIOGEL PI IND STRL 7.5 (GLOVE) ×5 IMPLANT
GLOVE BIOGEL PI INDICATOR 7.5 (GLOVE) ×5
GLOVE ECLIPSE 7.0 STRL STRAW (GLOVE) ×2 IMPLANT
GLOVE EXAM NITRILE LRG STRL (GLOVE) IMPLANT
GLOVE EXAM NITRILE XL STR (GLOVE) IMPLANT
GLOVE EXAM NITRILE XS STR PU (GLOVE) IMPLANT
GLOVE SURG SS PI 7.0 STRL IVOR (GLOVE) ×8 IMPLANT
GOWN STRL REUS W/ TWL LRG LVL3 (GOWN DISPOSABLE) ×2 IMPLANT
GOWN STRL REUS W/ TWL XL LVL3 (GOWN DISPOSABLE) ×2 IMPLANT
GOWN STRL REUS W/TWL 2XL LVL3 (GOWN DISPOSABLE) IMPLANT
GOWN STRL REUS W/TWL LRG LVL3 (GOWN DISPOSABLE) ×4
GOWN STRL REUS W/TWL XL LVL3 (GOWN DISPOSABLE) ×4
HEMOSTAT POWDER KIT SURGIFOAM (HEMOSTASIS) ×2 IMPLANT
KIT BASIN OR (CUSTOM PROCEDURE TRAY) ×2 IMPLANT
KIT TURNOVER KIT B (KITS) ×2 IMPLANT
NEEDLE HYPO 22GX1.5 SAFETY (NEEDLE) ×2 IMPLANT
NEEDLE SPNL 18GX3.5 QUINCKE PK (NEEDLE) ×2 IMPLANT
NS IRRIG 1000ML POUR BTL (IV SOLUTION) ×2 IMPLANT
PACK LAMINECTOMY NEURO (CUSTOM PROCEDURE TRAY) ×2 IMPLANT
PAD ARMBOARD 7.5X6 YLW CONV (MISCELLANEOUS) ×6 IMPLANT
SPONGE LAP 4X18 RFD (DISPOSABLE) IMPLANT
SUT MNCRL AB 3-0 PS2 18 (SUTURE) ×2 IMPLANT
SUT VIC AB 0 CT1 18XCR BRD8 (SUTURE) IMPLANT
SUT VIC AB 0 CT1 8-18 (SUTURE)
SUT VIC AB 2-0 CP2 18 (SUTURE) ×2 IMPLANT
TOWEL GREEN STERILE (TOWEL DISPOSABLE) ×2 IMPLANT
TOWEL GREEN STERILE FF (TOWEL DISPOSABLE) ×2 IMPLANT
WATER STERILE IRR 1000ML POUR (IV SOLUTION) ×2 IMPLANT

## 2019-11-04 NOTE — Progress Notes (Signed)
Neurosurgery Service Post-operative progress note  Assessment & Plan: 79 y.o. man s/p L3-4 MIS decompression, seen in PACU, still intubated but MAEx4.  -activity as tolerated, no restrictions, no brace needed -advance diet as tolerated  Judith Part  11/04/19 2:44 PM

## 2019-11-04 NOTE — Progress Notes (Signed)
Patient updated on delay. Patient repositioned. Attempted ROM with legs at patient request. ROM limited due to tense muscles even with multiple attempts and allowing time for muscle relaxation in between attempts.

## 2019-11-04 NOTE — Plan of Care (Signed)

## 2019-11-04 NOTE — Op Note (Signed)
PATIENT: [name]  DAY OF SURGERY: 11/04/19   PRE-OPERATIVE DIAGNOSIS:  Lumbar stenosis   POST-OPERATIVE DIAGNOSIS:  Lumbar stenosis   PROCEDURE:  Minimally invasive L3-4 laminectomy   SURGEON:  Surgeon(s) and Role:    Judith Part, MD - Primary   ANESTHESIA: ETGA   BRIEF HISTORY: This is a 79 year old man with a complex presentation of prior severe myelopathy / SCI with progressive lower extremity weakness and loss of ambulation. The only radiographic correlate to his progressive loss of function was his severe L3-4 canal stenosis. We discussed that, given the lack of other explanations of his progressive weakness and the difficulty in localizing his exam due to his myelopathy, this could be the potential source of his pathology and I therefore offered decompression to try and halt his progressive loss of function. We discussed risks, benefits, alternatives and the significant possibility of lack of improvement following decompression. The patient wished to proceed with surgery in the chance that it may help.    OPERATIVE DETAIL: The patient was taken to the operating room and placed on the OR table in the prone position. A formal time out was performed with two patient identifiers and confirmed the operative site. Anesthesia was induced by the anesthesia team. The operative site was marked, hair was clipped with surgical clippers, the area was then prepped and draped in a sterile fashion. Fluoroscopy was used to identify the surgical level with a spinal needle. A 2cm incision was then marked 1cm off to the right of midline. A right side approach was used to maximize left foraminal decompression, since it radiographically appeared worse. The fascia was incised sharply and serial dilators were docked to the spinolaminar interface on L3. A final tubular distractor was placed and secured to the table and fluoro again confirmed the correct surgical level. The spinous process, lamina, and  facet locations were confirmed for orientation. A high speed drill was then used to perform an L3 laminotomy until the insertion of the ligamentum flavum was identified superiorly. This was then extended to complete the hemilaminotomy by extending medially to the midline and laterally to the lateral insertion of the ligamentum flavum. The table was tilted and tubular dilator was adjusted to look across midline. An 'over the top' decompression was then performed by dissecting the ligamentum flavum off of the spinous process and lamina and identifying its insertion superiorly on the lamina and laterally at the facet. A #10 suction was then used to protect the thecal sac while the contralateral lamina was removed. With drilling complete, the ligamentum flavum was then completely resected from its insertion and dura was palpated in all directions to confirm good decompression. Hemostasis was obtained, the wound was copiously irrigated, and the tube was removed while using the microscope to confirm hemostasis of the muscle edges. All instrument and sponge counts were correct and the incision was then closed in layers. The patient was then returned to anesthesia for emergence. No apparent complications at the completion of the procedure.   EBL:  10mL   DRAINS: none   SPECIMENS: none   Judith Part, MD 11/04/19 12:27 PM

## 2019-11-04 NOTE — Progress Notes (Signed)
Pt received Metoprolol at 9.20 am per OR RN with a small sip of water.

## 2019-11-04 NOTE — Progress Notes (Signed)
PROGRESS NOTE    DIX HOVEL  [id number] DOB: 25-Mar-1941 DOA: 10/28/2019 PCP: [name], MD   Brief Narrative: NUR KRALIK is a 79 y.o. 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 Patient presented from inpatient rehab for worsening LE weakness and spasticity likely secondary to severe lumbar stenosis. Neurosurgery managing.   Assessment & Plan:   Principal Problem:   Incomplete paraplegia (HCC) Active Problems:   OSA on CPAP   Essential hypertension   Thoracic aortic aneurysm (HCC)   Acute on chronic respiratory failure with hypoxia (HCC)    Incomplete paraplegia Acute on chronic nerve root impingement of lumbar spine Associated worsening spasticity. Recent MRI significant for severe spinal stenosis of worst at L3-4 -Continue Baclofen 30 mg TID and 40 mg QHS, Tizanidine -PT/OT recommendations: CIR -Neurosurgery recommendations: Plan for surgery on 5/18 -Added Robaxin q6 hours prn; will give a one time dose of Robaxin 500 mg IV since patient is NPO today  Acute on chronic respiratory failure with hypoxia Patient is on 2L via [place] as an outpatient. Patient has needed as much as 4 L. Recent chest x-ray significant for atelectasis but CT from 5/5 significant for large left pleural effusion with collapse of majority of left lung. Patient is declining to use incentive spirometer. Complicated by left hemidiaphragm paresis. Weaned back down to 2 L. -Continue IS and flutter  Ascending aortic aneurysm Patient and patient's wife were unaware of this diagnosis. CTA chest from 2019 significant for 4.2 cm ascending thoracic aortic aneurysm. Recent Transthoracic Echocardiogram (09/18/2019) significant for  aortic root measuring 4.0 cm. Patient with poorly controlled blood pressure. History of CT chest consistent with atherosclerotic heart disease; on simvastatin 40 mg daily as an outpatient -Aggressive blood pressure control -Heart healthy diet (NPO today for surgery) -Continue Simvastatin  Hypertensive urgency Essential hypertension Blood pressure has been mostly uncontrolled. Patient with evidence of hypertensive heart disease on Transthoracic Echocardiogram with mild LVH noted. After further chart review, patient was on metoprolol succinate 50 mg daily, furosemide 40 mg daily and spironolactone 25 mg daily. Blood pressure improved. -Continue home metoprolol succinate 50 mg daily, furosemide 40 mg daily and spironolactone 25 mg daily  Hyperlipidemia -Continue simvastatin  Concern for UTI Ruled out. No symptoms. Urine culture negative. Discontinue antibiotics.  Chronic diastolic heart failure Per discussion with patient and wife, patient was previously on Lasix and Spironolactone. -Continue Lasix and spironolactone  Bladder incontinence Bowel incontinence Chronic.  Restless leg syndrome -Continue Sinemet  Myoclonic jerking -Continue Keppra  Hypothyroidism -Continue Synthroid  OSA -Continue CPAP qHS  Constipation Patient was having frequent loose stools -Miralax daily. Will need a bowel regimen with narcotics  Prediabetes Hemoglobin A1C of 5.8%. Patient is on metformin as an outpatient -Discontinue SSI; daily CBG in AM  Obesity Body mass index is 34.7 kg/m.   DVT prophylaxis: Lovenox discontinued 5/17 secondary to neurosurgery recommendations. Will need resumption of DVT prophylaxis per neurosurgery recommendations post-op Code Status:   Code Status: DNR Family Communication: None at bedside Disposition Plan: Discharge likely back to CIR pending neurosurgery recommendations/management post surgery   Consultants:   Neurosurgery  Procedures:    None  Antimicrobials:  Vancomycin  Cefepime    Subjective: Spasms better controlled with PRN Robaxin last night but worse this  morning since he is NPO and is unable to get his medication.  Objective: Vitals:   11/03/19 2155 11/04/19 0500 11/04/19 0742 11/04/19 0834  BP: 103/62   138/74  Pulse: 71  69 69  Resp: (!) 21  20 16   Temp: 98.3 F (36.8 C)   98.1 F (36.7 C)  TempSrc:      SpO2: 96%  92% 97%  Weight:  100.5 kg    Height:        Intake/Output Summary (Last 24 hours) at 11/04/2019 0922 Last data filed at 11/04/2019 [id number] Gross per 24 hour  Intake 725 ml  Output 950 ml  Net -225 ml   Filed Weights   10/29/19 0500 10/30/19 0500 11/04/19 0500  Weight: 100.7 kg 100.7 kg 100.5 kg    Examination:  General exam: Appears calm and comfortable Respiratory system: Clear to auscultation. Respiratory effort normal. Cardiovascular system: S1 & S2 heard, RRR. No murmurs, rubs, gallops or clicks. Gastrointestinal system: Abdomen is slightly distended, soft and nontender. No organomegaly or masses felt. Normal bowel sounds heard. Central nervous system: Alert and oriented. No focal neurological deficits. Musculoskeletal: No edema. No calf tenderness. Frequent LE jerking noted from spasms Skin: No cyanosis. No rashes Psychiatry: Judgement and insight appear normal. Mood & affect appropriate.    Data Reviewed: I have personally reviewed following labs and imaging studies  CBC: Recent Labs  Lab 10/29/19 0625 11/01/19 0522 11/04/19 0239  WBC 10.2 10.6* 18.5*  HGB 12.2* 12.6* 12.2*  HCT 38.8* 39.9 39.1  MCV 91.1 89.5 91.1  PLT 286 254 [id number]   Basic Metabolic Panel: Recent Labs  Lab 10/29/19 0625 10/30/19 0336 10/31/19 0500 11/01/19 0522 11/04/19 0239  NA 143 142 142 142 141  K 4.1 3.5 3.3* 4.0 4.0  CL 101 101 97* 101 98  CO2 30 28 33* 34* 34*  GLUCOSE 83 92 102* 122* 105*  BUN 15 16 18 20 21   CREATININE 0.76 0.86 1.04 0.94 0.89  CALCIUM 9.1 8.9 9.1 8.9  9.0   GFR: Estimated Creatinine Clearance: 77.3 mL/min (by C-G formula based on SCr of 0.89 mg/dL). Liver Function Tests: No results for input(s): AST, ALT, ALKPHOS, BILITOT, PROT, ALBUMIN in the last 168 hours. No results for input(s): LIPASE, AMYLASE in the last 168 hours. No results for input(s): AMMONIA in the last 168 hours. Coagulation Profile: No results for input(s): INR, PROTIME in the last 168 hours. Cardiac Enzymes: No results for input(s): CKTOTAL, CKMB, CKMBINDEX, TROPONINI in the last 168 hours. BNP (last 3 results) Recent Labs    02/14/19 1001  PROBNP 482   HbA1C: No results for input(s): HGBA1C in the last 72 hours. CBG: Recent Labs  Lab 11/03/19 0750 11/03/19 1147 11/03/19 1601 11/03/19 2150 11/04/19 0828  GLUCAP 100* 122* 131* 171* 94   Lipid Profile: No results for input(s): CHOL, HDL, LDLCALC, TRIG, CHOLHDL, LDLDIRECT in the last 72 hours. Thyroid Function Tests: No results for input(s): TSH, T4TOTAL, FREET4, T3FREE, THYROIDAB in the last 72 hours. Anemia Panel: No results for input(s): VITAMINB12, FOLATE, FERRITIN, TIBC, IRON, RETICCTPCT in the last 72 hours. Sepsis Labs: No results for input(s): PROCALCITON, LATICACIDVEN in the last 168 hours.  Recent Results (from the past 240 hour(s))  Surgical PCR screen     Status: Abnormal   Collection Time: 10/26/19 10:00 AM   Specimen: Nasal Mucosa; Nasal Swab  Result Value Ref Range Status   MRSA, PCR POSITIVE (A) NEGATIVE Final    Comment: RESULT CALLED TO,  READ BACK BY AND VERIFIED WITH: RN SUSAN LEE 1247 10/26/19 KB    Staphylococcus aureus POSITIVE (A) NEGATIVE Final    Comment: RESULT CALLED TO, READ BACK BY AND VERIFIED WITH: RN SUSAN LEE 1247 10/26/19 KB (NOTE) The Xpert SA Assay (FDA approved for NASAL specimens in patients 15 years of age and older), is one component of a comprehensive surveillance program. It is not intended to diagnose infection nor to guide or monitor treatment.   Culture,  Urine     Status: None   Collection Time: 10/28/19  1:20 PM   Specimen: Urine, Random  Result Value Ref Range Status   Specimen Description URINE, RANDOM  Final   Special Requests NONE  Final   Culture   Final    NO GROWTH Performed at [facility], [address] [place], [place] [address]    Report Status 10/29/2019 FINAL  Final         Radiology Studies: No results found.      Scheduled Meds: . allopurinol  300 mg Oral Daily  . aspirin EC  81 mg Oral Daily  . atorvastatin  20 mg Oral q1800  . baclofen  30 mg Oral TID WC  . baclofen  40 mg Oral QHS  . calcium citrate  200 mg of elemental calcium Oral Daily  . carbidopa-levodopa  1 tablet Oral BID  . Chlorhexidine Gluconate Cloth  6 each Topical Daily  . collagenase   Topical Daily  . fluticasone furoate-vilanterol  1 puff Inhalation Daily  . furosemide  40 mg Oral Daily  . gabapentin  100 mg Oral QHS  . guaiFENesin  1,200 mg Oral BID  . latanoprost  1 drop Both Eyes QHS  . levETIRAcetam  250 mg Oral BID  . [START ON 11/05/2019] levothyroxine  125 mcg Oral QAC breakfast  . metoprolol succinate  50 mg Oral Daily  . multivitamin with minerals  1 tablet Oral Daily  . pantoprazole  40 mg Oral QPM  . polyethylene glycol  17 g Oral Daily  . sodium chloride flush  3 mL Intravenous Q12H  . spironolactone  25 mg Oral Daily  . tamsulosin  0.8 mg Oral QPC supper  . tiZANidine  2 mg Oral TID  . umeclidinium bromide  1 puff Inhalation Daily   Continuous Infusions:    LOS: 7 days     Cordelia Poche, MD [facility] 11/04/2019, 9:22 AM  If 7PM-7AM, please contact night-coverage [email or web address]

## 2019-11-04 NOTE — Progress Notes (Signed)
Neurosurgery Service Progress Note  Subjective: NAE ON, no new complaints  Objective: Vitals:   11/03/19 2155 11/04/19 0500 11/04/19 0742 11/04/19 0834  BP: 103/62   138/74  Pulse: 71  69 69  Resp: (!) 21  20 16   Temp: 98.3 F (36.8 C)   98.1 F (36.7 C)  TempSrc:      SpO2: 96%  92% 97%  Weight:  100.5 kg    Height:       Temp (24hrs), Avg:98.1 F (36.7 C), Min:98 F (36.7 C), Max:98.3 F (36.8 C)  CBC Latest Ref Rng & Units 11/04/2019 11/01/2019 10/29/2019  WBC 4.0 - 10.5 K/uL 18.5(H) 10.6(H) 10.2  Hemoglobin 13.0 - 17.0 g/dL 12.2(L) 12.6(L) 12.2(L)  Hematocrit 39.0 - 52.0 % 39.1 39.9 38.8(L)  Platelets 150 - 400 K/uL 210 254 286   BMP Latest Ref Rng & Units 11/04/2019 11/01/2019 10/31/2019  Glucose 70 - 99 mg/dL 105(H) 122(H) 102(H)  BUN 8 - 23 mg/dL 21 20 18   Creatinine 0.61 - 1.24 mg/dL 0.89 0.94 1.04  BUN/Creat Ratio 10 - 24 - - -  Sodium 135 - 145 mmol/L 141 142 142  Potassium 3.5 - 5.1 mmol/L 4.0 4.0 3.3(L)  Chloride 98 - 111 mmol/L 98 101 97(L)  CO2 22 - 32 mmol/L 34(H) 34(H) 33(H)  Calcium 8.9 - 10.3 mg/dL 9.0 8.9 9.1    Intake/Output Summary (Last 24 hours) at 11/04/2019 1226 Last data filed at 11/04/2019 [id number] Gross per 24 hour  Intake 485 ml  Output 950 ml  Net -465 ml    Current Facility-Administered Medications:  .  [MAR Hold] acetaminophen (TYLENOL) tablet 650 mg, 650 mg, Oral, Q6H PRN, Tamala Julian, Rondell A, MD .  Doug Sou Hold] albuterol (PROVENTIL) (2.5 MG/3ML) 0.083% nebulizer solution 2.5 mg, 2.5 mg, Nebulization, Q6H PRN, Norval Morton, MD .  Doug Sou Hold] allopurinol (ZYLOPRIM) tablet 300 mg, 300 mg, Oral, Daily, Smith, Rondell A, MD, 300 mg at 11/03/19 0901 .  [MAR Hold] aspirin EC tablet 81 mg, 81 mg, Oral, Daily, Smith, Rondell A, MD, 81 mg at 11/03/19 0901 .  [MAR Hold] atorvastatin (LIPITOR) tablet 20 mg, 20 mg, Oral, q1800, Mariel Aloe, MD, 20 mg at 11/03/19 1811 .  [MAR Hold] baclofen (LIORESAL) tablet 30 mg, 30 mg, Oral, TID WC, Smith, Rondell  A, MD, 30 mg at 11/03/19 1546 .  [MAR Hold] baclofen (LIORESAL) tablet 40 mg, 40 mg, Oral, QHS, Smith, Rondell A, MD, 40 mg at 11/03/19 2150 .  [MAR Hold] calcium citrate (CALCITRATE - dosed in mg elemental calcium) tablet 200 mg of elemental calcium, 200 mg of elemental calcium, Oral, Daily, Smith, Rondell A, MD, 200 mg of elemental calcium at 11/03/19 0903 .  [MAR Hold] carbidopa-levodopa (SINEMET IR) 10-100 MG per tablet immediate release 1 tablet, 1 tablet, Oral, BID, Fuller Plan A, MD, 1 tablet at 11/03/19 2149 .  ceFAZolin (ANCEF) 2-4 GM/100ML-% IVPB, , , ,  .  ceFAZolin (ANCEF) IVPB 2g/100 mL premix, 2 g, Intravenous, Once, Alyrica Thurow, Joyice Faster, MD .  Doug Sou Hold] Chlorhexidine Gluconate Cloth 2 % PADS 6 each, 6 each, Topical, Daily, Mariel Aloe, MD, 6 each at 11/04/19 0900 .  [MAR Hold] collagenase (SANTYL) ointment, , Topical, Daily, Norval Morton, MD, Given at 11/03/19 1001 .  [MAR Hold] fluticasone furoate-vilanterol (BREO ELLIPTA) 200-25 MCG/INH 1 puff, 1 puff, Inhalation, Daily, Tamala Julian, Rondell A, MD, 1 puff at 11/04/19 0741 .  [MAR Hold] furosemide (LASIX) tablet 40 mg, 40 mg, Oral, Daily,  Mariel Aloe, MD, 40 mg at 11/03/19 0901 .  [MAR Hold] gabapentin (NEURONTIN) capsule 100 mg, 100 mg, Oral, QHS, Smith, Rondell A, MD, 100 mg at 11/03/19 2150 .  [MAR Hold] guaiFENesin (MUCINEX) 12 hr tablet 1,200 mg, 1,200 mg, Oral, BID, Smith, Rondell A, MD, 1,200 mg at 11/03/19 2149 .  [MAR Hold] HYDROcodone-acetaminophen (NORCO/VICODIN) 5-325 MG per tablet 1 tablet, 1 tablet, Oral, Q6H PRN, Opyd, Ilene Qua, MD, 1 tablet at 11/03/19 1811 .  [MAR Hold] ipratropium-albuterol (DUONEB) 0.5-2.5 (3) MG/3ML nebulizer solution 3 mL, 3 mL, Nebulization, Q4H PRN, Tamala Julian, Rondell A, MD .  Doug Sou Hold] labetalol (NORMODYNE) injection 10 mg, 10 mg, Intravenous, Q2H PRN, Opyd, Ilene Qua, MD .  lactated ringers infusion, , Intravenous, Continuous, Oleta Mouse, MD, Last Rate: 10 mL/hr at 11/04/19 1002,  New Bag at 11/04/19 1002 .  [MAR Hold] latanoprost (XALATAN) 0.005 % ophthalmic solution 1 drop, 1 drop, Both Eyes, QHS, Smith, Rondell A, MD, 1 drop at 11/03/19 2150 .  [MAR Hold] levETIRAcetam (KEPPRA) tablet 250 mg, 250 mg, Oral, BID, Smith, Rondell A, MD, 250 mg at 11/03/19 2150 .  [MAR Hold] levothyroxine (SYNTHROID) tablet 125 mcg, 125 mcg, Oral, QAC breakfast, Opyd, Ilene Qua, MD .  Doug Sou Hold] methocarbamol (ROBAXIN) 500 mg in dextrose 5 % 50 mL IVPB, 500 mg, Intravenous, Once, Mariel Aloe, MD .  Doug Sou Hold] methocarbamol (ROBAXIN) tablet 750 mg, 750 mg, Oral, Q6H PRN, Mariel Aloe, MD, 750 mg at 11/03/19 0958 .  [MAR Hold] metoprolol succinate (TOPROL-XL) 24 hr tablet 50 mg, 50 mg, Oral, Daily, Mariel Aloe, MD, 50 mg at 11/04/19 0921 .  [MAR Hold] multivitamin with minerals tablet 1 tablet, 1 tablet, Oral, Daily, Tamala Julian, Rondell A, MD, 1 tablet at 11/03/19 0901 .  [MAR Hold] nitroGLYCERIN (NITROSTAT) SL tablet 0.4 mg, 0.4 mg, Sublingual, Q5 min PRN, Tamala Julian, Rondell A, MD .  Doug Sou Hold] ondansetron (ZOFRAN) tablet 4 mg, 4 mg, Oral, Q6H PRN **OR** [MAR Hold] ondansetron (ZOFRAN) injection 4 mg, 4 mg, Intravenous, Q6H PRN, Tamala Julian, Rondell A, MD .  Doug Sou Hold] pantoprazole (PROTONIX) EC tablet 40 mg, 40 mg, Oral, QPM, Smith, Rondell A, MD, 40 mg at 11/03/19 1811 .  [MAR Hold] polyethylene glycol (MIRALAX / GLYCOLAX) packet 17 g, 17 g, Oral, Daily, Mariel Aloe, MD, 17 g at 11/02/19 0857 .  [MAR Hold] sodium chloride flush (NS) 0.9 % injection 3 mL, 3 mL, Intravenous, Q12H, Smith, Rondell A, MD, 3 mL at 11/03/19 2151 .  [MAR Hold] spironolactone (ALDACTONE) tablet 25 mg, 25 mg, Oral, Daily, Mariel Aloe, MD, 25 mg at 11/03/19 0901 .  [MAR Hold] SUMAtriptan (IMITREX) tablet 50 mg, 50 mg, Oral, Q2H PRN, Fuller Plan A, MD, 50 mg at 11/03/19 0904 .  [MAR Hold] tamsulosin (FLOMAX) capsule 0.8 mg, 0.8 mg, Oral, QPC supper, Tamala Julian, Rondell A, MD, 0.8 mg at 11/03/19 1810 .  [MAR Hold]  tiZANidine (ZANAFLEX) tablet 2 mg, 2 mg, Oral, TID, Tamala Julian, Rondell A, MD, 2 mg at 11/03/19 2149 .  [MAR Hold] traZODone (DESYREL) tablet 50 mg, 50 mg, Oral, QHS PRN, Fuller Plan A, MD, 50 mg at 10/30/19 2317 .  [MAR Hold] umeclidinium bromide (INCRUSE ELLIPTA) 62.5 MCG/INH 1 puff, 1 puff, Inhalation, Daily, Mariel Aloe, MD, 1 puff at 11/04/19 0741   Physical Exam: 4+/5 in BUE, BLE with dec'd sensation and strength 3/5 in most groups with occasional spasms in LLE, R DF/PF/EHL 1/5  Assessment & Plan: 79 y.o.  man with previously treated cervical myelopathy and resulting spasticity, now with worsening lower extremity function. 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  -OR today, hold DVT chemoPPx until 5/20, advance diet as tolerated post-op  Judith Part  11/04/19 12:26 PM

## 2019-11-04 NOTE — Brief Op Note (Signed)
11/04/2019  2:44 PM  PATIENT:  [name]  79 y.o. male  PRE-OPERATIVE DIAGNOSIS:  Lumbar stenosis  POST-OPERATIVE DIAGNOSIS:  Lumbar stenosis  PROCEDURE:  Procedure(s): LUMBAR THREE-FOUR LUMBAR LAMINECTOMY/ DECOMPRESSION WITH MET-RX (N/A)  SURGEON:  Surgeon(s) and Role:    * Laverna Dossett, Joyice Faster, MD - Primary    * Consuella Lose, MD - Assisting  PHYSICIAN ASSISTANT:   ANESTHESIA:   general  EBL:  50cc  BLOOD ADMINISTERED:none  DRAINS: none   LOCAL MEDICATIONS USED:  LIDOCAINE   SPECIMEN:  No Specimen  DISPOSITION OF SPECIMEN:  N/A  COUNTS:  YES  TOURNIQUET:  * No tourniquets in log *  DICTATION: .Note written in EPIC  PLAN OF CARE: Admit to inpatient   PATIENT DISPOSITION:  PACU - hemodynamically stable.   Delay start of Pharmacological VTE agent (>24hrs) due to surgical blood loss or risk of bleeding: yes

## 2019-11-04 NOTE — Anesthesia Preprocedure Evaluation (Addendum)
Anesthesia Evaluation  Patient identified by MRN, date of birth, ID band Patient awake    Reviewed: Allergy & Precautions, NPO status , Patient's Chart, lab work & pertinent test results  History of Anesthesia Complications Negative for: history of anesthetic complications  Airway Mallampati: II  TM Distance: >3 FB Neck ROM: Full    Dental  (+) Edentulous Upper, Edentulous Lower   Pulmonary shortness of breath and Long-Term Oxygen Therapy, sleep apnea and Continuous Positive Airway Pressure Ventilation , COPD,  COPD inhaler, former smoker,    + rhonchi        Cardiovascular hypertension, + Peripheral Vascular Disease and +CHF  + Valvular Problems/Murmurs AI  Rhythm:Regular Rate:Normal  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  Annual imaging recommended.  ECG: SR, rate 69  ECHO: 1. Normal LV systolic function; mild LVH; mild LAE; mildly dilated aortic root; mild AI. 2. Left ventricular ejection fraction, by estimation, is 55 to 60%. The left ventricle has normal function. The left ventricle has no regional wall motion abnormalities. There is mild left ventricular hypertrophy. Left ventricular diastolic parameters were normal. 3. Right ventricular systolic function is normal. The right ventricular size is normal. 4. Left atrial size was mildly dilated. 5. The mitral valve is normal in structure. No evidence of mitral valve regurgitation. No evidence of mitral stenosis. 6. The aortic valve is tricuspid. Aortic valve regurgitation is mild. Mild to moderate aortic valve sclerosis/calcification is present, without any evidence of aortic stenosis. 7. Aortic dilatation noted. There is mild dilatation of the aortic root measuring 40 mm. 8. The inferior vena cava is normal in size with greater than 50% respiratory variability, suggesting right atrial pressure of 3 mmHg.   Neuro/Psych  Headaches,   Neuromuscular disease negative psych ROS   GI/Hepatic Neg liver ROS, GERD  Medicated and Controlled,  Endo/Other  diabetes, Oral Hypoglycemic AgentsHypothyroidism Morbid obesity  Renal/GU negative Renal ROS     Musculoskeletal  (+) Arthritis , Gout Wheelchair bound   Abdominal (+) + obese,   Peds  Hematology  (+) anemia ,   Anesthesia Other Findings SPINAL CORD INJURY  Reproductive/Obstetrics                            Anesthesia Physical Anesthesia Plan  ASA: III  Anesthesia Plan: General   Post-op Pain Management:    Induction: Intravenous  PONV Risk Score and Plan: 2 and Ondansetron and Dexamethasone  Airway Management Planned: Oral ETT  Additional Equipment: None  Intra-op Plan:   Post-operative Plan: Extubation in OR and Possible Post-op intubation/ventilation  Informed Consent: I have reviewed the patients History and Physical, chart, labs and discussed the procedure including the risks, benefits and alternatives for the proposed anesthesia with the patient or authorized representative who has indicated his/her understanding and acceptance.   Patient has DNR.  Discussed DNR with patient and Suspend DNR.   Dental advisory given  Plan Discussed with: CRNA and Surgeon  Anesthesia Plan Comments: (Given reintubation last procedure, communicated with PACU about bringing patient out intubated for expected slow emergence)      Anesthesia Quick Evaluation

## 2019-11-04 NOTE — Transfer of Care (Signed)
Immediate Anesthesia Transfer of Care Note  Patient: [name]  Procedure(s) Performed: LUMBAR THREE-FOUR LUMBAR LAMINECTOMY/ DECOMPRESSION WITH MET-RX (N/A Back)  Patient Location: PACU  Anesthesia Type:General  Level of Consciousness: sedated and Patient remains intubated per anesthesia plan  Airway & Oxygen Therapy: Patient remains intubated per anesthesia plan and Patient placed on Ventilator (see vital sign flow sheet for setting)  Post-op Assessment: Report given to RN and Post -op Vital signs reviewed and stable  Post vital signs: Reviewed and stable  Last Vitals:  Vitals Value Taken Time  BP 122/83 11/04/19 1503  Temp 36.9 C 11/04/19 1502  Pulse 93 11/04/19 1508  Resp 26 11/04/19 1508  SpO2 100 % 11/04/19 1508  Vitals shown include unvalidated device data.  Last Pain:  Vitals:   11/03/19 1911  TempSrc:   PainSc: 3       Patients Stated Pain Goal: 1 (07/61/91 5502)  Complications: No apparent anesthesia complications

## 2019-11-04 NOTE — Anesthesia Procedure Notes (Signed)
Procedure Name: Intubation Date/Time: 11/04/2019 12:56 PM Performed by: Gwyndolyn Saxon, CRNA Pre-anesthesia Checklist: Patient identified, Emergency Drugs available, Suction available and Patient being monitored Patient Re-evaluated:Patient Re-evaluated prior to induction Oxygen Delivery Method: Circle system utilized Preoxygenation: Pre-oxygenation with 100% oxygen Induction Type: IV induction Ventilation: Mask ventilation without difficulty and Oral airway inserted - appropriate to patient size Laryngoscope Size: Glidescope and 3 Grade View: Grade I Tube type: Oral Tube size: 7.5 mm Number of attempts: 1 Airway Equipment and Method: Rigid stylet and Video-laryngoscopy Placement Confirmation: ETT inserted through vocal cords under direct vision,  positive ETCO2 and breath sounds checked- equal and bilateral Secured at: 22 cm Tube secured with: Tape Dental Injury: Teeth and Oropharynx as per pre-operative assessment

## 2019-11-05 LAB — COMPREHENSIVE METABOLIC PANEL
ALT: 13 U/L (ref 0–44)
AST: 18 U/L (ref 15–41)
Albumin: 3.5 g/dL (ref 3.5–5.0)
Alkaline Phosphatase: 75 U/L (ref 38–126)
Anion gap: 13 (ref 5–15)
BUN: 22 mg/dL (ref 8–23)
CO2: 29 mmol/L (ref 22–32)
Calcium: 9.2 mg/dL (ref 8.9–10.3)
Chloride: 100 mmol/L (ref 98–111)
Creatinine, Ser: 0.79 mg/dL (ref 0.61–1.24)
GFR calc Af Amer: >60 mL/min (ref >60)
GFR calc non Af Amer: >60 mL/min (ref >60)
Glucose, Bld: 150 mg/dL — ABNORMAL HIGH (ref 70–99)
Potassium: 4.5 mmol/L (ref 3.5–5.1)
Sodium: 142 mmol/L (ref 135–145)
Total Bilirubin: 0.4 mg/dL (ref 0.3–1.2)
Total Protein: 6.8 g/dL (ref 6.5–8.1)

## 2019-11-05 LAB — CBC WITH DIFFERENTIAL/PLATELET
Abs Immature Granulocytes: 0.24 10*3/uL — ABNORMAL HIGH (ref 0.00–0.07)
Basophils Absolute: 0.1 10*3/uL (ref 0.0–0.1)
Basophils Relative: 0 %
Eosinophils Absolute: 0 10*3/uL (ref 0.0–0.5)
Eosinophils Relative: 0 %
HCT: 39 % (ref 39.0–52.0)
Hemoglobin: 12.3 g/dL — ABNORMAL LOW (ref 13.0–17.0)
Immature Granulocytes: 2 %
Lymphocytes Relative: 7 %
Lymphs Abs: 1 10*3/uL (ref 0.7–4.0)
MCH: 28.5 pg (ref 26.0–34.0)
MCHC: 31.5 g/dL (ref 30.0–36.0)
MCV: 90.5 fL (ref 80.0–100.0)
Monocytes Absolute: 0.5 10*3/uL (ref 0.1–1.0)
Monocytes Relative: 4 %
Neutro Abs: 12.1 10*3/uL — ABNORMAL HIGH (ref 1.7–7.7)
Neutrophils Relative %: 87 %
Platelets: 204 10*3/uL (ref 150–400)
RBC: 4.31 MIL/uL (ref 4.22–5.81)
RDW: 14.8 % (ref 11.5–15.5)
WBC: 13.9 10*3/uL — ABNORMAL HIGH (ref 4.0–10.5)
nRBC: 0 % (ref 0.0–0.2)

## 2019-11-05 LAB — GLUCOSE, CAPILLARY
Glucose-Capillary: 120 mg/dL — ABNORMAL HIGH (ref 70–99)
Glucose-Capillary: 124 mg/dL — ABNORMAL HIGH (ref 70–99)
Glucose-Capillary: 98 mg/dL (ref 70–99)

## 2019-11-05 MED ORDER — CALCIUM CARBONATE ANTACID 500 MG PO CHEW
1.0000 | CHEWABLE_TABLET | ORAL | Status: DC | PRN
  Administered 2019-11-05: 200 mg via ORAL
  Filled 2019-11-05: qty 1

## 2019-11-05 NOTE — Progress Notes (Signed)
Called pharmacy to confirm PRN order for Tums was appropriate for patient. Pharmacy confirmed that it was.

## 2019-11-05 NOTE — Progress Notes (Signed)
Inpatient Rehabilitation-Admissions Coordinator   [facility] following pt progress since readmit back to acute after CIR stay (4/9-5/11). Pt is POD 1. Will follow to see how pt does with therapies to determine if a return to CIR is warranted.   Raechel Ache, OTR/L  Rehab Admissions Coordinator  [phone] 11/05/2019 10:35 AM

## 2019-11-05 NOTE — Plan of Care (Signed)

## 2019-11-05 NOTE — Progress Notes (Signed)
PROGRESS NOTE    [name]  [id number] DOB: 13-Jun-1941 DOA: 10/28/2019 PCP: [name], MD    No chief complaint on file.   Brief Narrative:  [name] is a 79 y.o. 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 Patient presented from inpatient rehab for worsening LE weakness and spasticity likely secondary to severe lumbar stenosis. Neurosurgery managing. 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 S/p surgery with laminectomy on 11/04/2019 Robaxin, baclofen. PT OT recommending CIR.   Acute on chronic respiratory failure with hypoxia complicated by left hemidiaphragm paralysis. Patient on 2 L of nasal cannula as outpatient. Current recommend to continue with incentive spirometry.   Ascending aortic aneurysm CT angiogram potassium 19 showed a 4.2 cm ascending thoracic aortic aneurysm Recommend outpatient follow-up PCP and yearly CTA versus ultrasound. Continue with simvastatin daily.  Essential hypertension Blood pressure parameters are well controlled.   Hyperlipidemia Continue with the simvastatin.   History of chronic diastolic heart failure. To be compensated, continue with Lasix and spironolactone.   Restless leg syndrome Continue with home medications.   Hypothyroidism Continue with Synthroid.   Obstructive sleep  apnea Continue CPAP at bedtime.   Constipation Resolved.   Myoclonic jerking Continue with Keppra.  Body mass index is 34.46 kg/m. Outpatient follow-up with PCP   DVT prophylaxis: SCDs Code Status: Full code Family Communication: None at bedside Disposition:   Status is: Inpatient  Remains inpatient appropriate because:Inpatient level of care appropriate due to severity of illness   Dispo: The patient is from: CIR              Anticipated d/c is to: CIR              Anticipated d/c date is: 1 day              Patient currently is not medically stable to d/c.        Consultants:   Neurosurgery   Procedures:5/18 s/p L3-4 MIS laminectomy   Antimicrobials:  Anti-infectives (From admission, onward)   Start     Dose/Rate Route Frequency Ordered Stop   11/04/19 1342  bacitracin 50,000 Units in sodium chloride 0.9 % 500 mL irrigation  Status:  Discontinued       As needed 11/04/19 1342 11/04/19 1459   11/04/19 1215  ceFAZolin (ANCEF) IVPB 2g/100 mL premix     2 g 200 mL/hr over 30 Minutes Intravenous  Once 11/04/19 1209 11/04/19 1326   11/04/19 1211  ceFAZolin (ANCEF) 2-4 GM/100ML-% IVPB    Note to Pharmacy: Henrine Screws   : cabinet override      11/04/19 1211 11/04/19 1339   10/29/19 0100  vancomycin (VANCOREADY) IVPB 1250 mg/250 mL  Status:  Discontinued     1,250 mg 166.7 mL/hr over 90 Minutes Intravenous Every 12 hours 10/28/19 2036 10/30/19 1228   10/28/19 2300  ceFEPIme (MAXIPIME) 2 g in sodium chloride 0.9 % 100 mL IVPB  Status:  Discontinued  2 g 200 mL/hr over 30 Minutes Intravenous Every 8 hours 10/28/19 2036 10/30/19 1228          Subjective: No new complaints  Objective: Vitals:   11/05/19 0743 11/05/19 0804 11/05/19 1550 11/05/19 1627  BP:  (!) 141/74 108/66 104/64  Pulse: 87 83 79 72  Resp: 20 19 19 16   Temp:  98.7 F (37.1 C) 98.7 F (37.1 C) 98.2 F (36.8 C)  TempSrc:      SpO2: 97% 95% 94% 94%  Weight:      Height:         Intake/Output Summary (Last 24 hours) at 11/05/2019 1649 Last data filed at 11/05/2019 1425 Gross per 24 hour  Intake 120 ml  Output 1450 ml  Net -1330 ml   Filed Weights   10/30/19 0500 11/04/19 0500 11/05/19 0500  Weight: 100.7 kg 100.5 kg 99.8 kg    Examination:  General exam: Appears calm and comfortable  Respiratory system: Clear to auscultation. Respiratory effort normal. Cardiovascular system: S1 & S2 heard, RRR. No JVD,. No pedal edema. Gastrointestinal system: Abdomen is nondistended, soft and nontender.  Normal bowel sounds heard. Central nervous system: Alert and oriented. No focal neurological deficits. Extremities: Symmetric 5 x 5 power. Skin: No rashes, lesions or ulcers Psychiatry: . Mood & affect appropriate.     Data Reviewed: I have personally reviewed following labs and imaging studies  CBC: Recent Labs  Lab 11/01/19 0522 11/04/19 0239 11/05/19 0243  WBC 10.6* 18.5* 13.9*  NEUTROABS  --   --  12.1*  HGB 12.6* 12.2* 12.3*  HCT 39.9 39.1 39.0  MCV 89.5 91.1 90.5  PLT 254 210 0000000    Basic Metabolic Panel: Recent Labs  Lab 10/30/19 0336 10/31/19 0500 11/01/19 0522 11/04/19 0239 11/05/19 0243  NA 142 142 142 141 142  K 3.5 3.3* 4.0 4.0 4.5  CL 101 97* 101 98 100  CO2 28 33* 34* 34* 29  GLUCOSE 92 102* 122* 105* 150*  BUN 16 18 20 21 22   CREATININE 0.86 1.04 0.94 0.89 0.79  CALCIUM 8.9 9.1 8.9 9.0 9.2    GFR: Estimated Creatinine Clearance: 85.7 mL/min (by C-G formula based on SCr of 0.79 mg/dL).  Liver Function Tests: Recent Labs  Lab 11/05/19 0243  AST 18  ALT 13  ALKPHOS 75  BILITOT 0.4  PROT 6.8  ALBUMIN 3.5    CBG: Recent Labs  Lab 11/04/19 1506 11/04/19 1718 11/04/19 2227 11/05/19 0805 11/05/19 1641  GLUCAP 127* 142* 140* 120* 124*     Recent Results (from the past 240 hour(s))  Culture, Urine     Status: None   Collection Time: 10/28/19  1:20 PM   Specimen: Urine, Random  Result Value Ref Range Status    Specimen Description URINE, RANDOM  Final   Special Requests NONE  Final   Culture   Final    NO GROWTH Performed at [facility], [place] [address], [place], [place] [address]    Report Status 10/29/2019 FINAL  Final         Radiology Studies: DG Lumbar Spine 1 View  Result Date: 11/04/2019 CLINICAL DATA:  Lumbar surgery. EXAM: LUMBAR SPINE - 1 VIEW COMPARISON:  CT 10/22/2019. FINDINGS: Lumbar spine numbered the lowest segmented appearing lumbar shaped vertebrae on lateral view as L5. Surgical instrument is noted posteriorly at the level of L4. IMPRESSION: Postsurgical changes lumbar spine. Electronically Signed   By: Marcello Moores  Register   On: 11/04/2019 15:06  DG C-Arm 1-60 Min  Result Date: 11/04/2019 CLINICAL DATA:  L3-L4 laminectomy. EXAM: DG C-ARM 1-60 MIN CONTRAST:  None. FLUOROSCOPY TIME:  Fluoroscopy Time:  0 minutes 9 seconds Number of Acquired Spot Images: 1 COMPARISON:  CT lumbar spine 10/22/2019. FINDINGS: Lumbar spine numbered the lowest segmented appearing lumbar shaped vertebrae on lateral view as L5. Metallic surgical instrument noted posterior to the level of L4. IMPRESSION: Postsurgical changes lumbar spine. Electronically Signed   By: Harlan   On: 11/04/2019 15:34        Scheduled Meds: . allopurinol  300 mg Oral Daily  . aspirin EC  81 mg Oral Daily  . atorvastatin  20 mg Oral q1800  . baclofen  30 mg Oral TID WC  . baclofen  40 mg Oral QHS  . calcium citrate  200 mg of elemental calcium Oral Daily  . carbidopa-levodopa  1 tablet Oral BID  . Chlorhexidine Gluconate Cloth  6 each Topical Daily  . collagenase   Topical Daily  . fluticasone furoate-vilanterol  1 puff Inhalation Daily  . furosemide  40 mg Oral Daily  . gabapentin  100 mg Oral QHS  . guaiFENesin  1,200 mg Oral BID  . latanoprost  1 drop Both Eyes QHS  . levETIRAcetam  250 mg Oral BID  . levothyroxine  125 mcg Oral QAC breakfast  . metoprolol succinate  50 mg Oral Daily  .  multivitamin with minerals  1 tablet Oral Daily  . pantoprazole  40 mg Oral QPM  . polyethylene glycol  17 g Oral Daily  . sodium chloride flush  3 mL Intravenous Q12H  . spironolactone  25 mg Oral Daily  . tamsulosin  0.8 mg Oral QPC supper  . tiZANidine  2 mg Oral TID  . umeclidinium bromide  1 puff Inhalation Daily   Continuous Infusions: . lactated ringers 10 mL/hr at 11/04/19 1002  . methocarbamol (ROBAXIN) IV       LOS: 8 days        Hosie Poisson, MD [facility]   To contact the attending provider between 7A-7P or the covering provider during after hours 7P-7A, please log into the web site [email or web address] and access using universal [place] password for that web site. If you do not have the password, please call the hospital operator.  11/05/2019, 4:49 PM

## 2019-11-05 NOTE — Progress Notes (Signed)
Occupational Therapy Treatment Patient Details Name: [name] MRN: [id number] DOB: 11/04/1940 Today's Date: 11/05/2019    History of present illness [name] is a 79 y.o. 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 Patient presented from inpatient rehab for worsening LE weakness and spasticity likely secondary to severe lumbar stenosis.  Pt now s/p MIS laminectomy L3-4 on 11/04/19.  Per neurosurgeon note no activity restrictions.   OT comments  Pt seen in conjunction with PT to increase functional mobility and overall activity tolerance. Pt required MAX- total A +2 to transition from supine<>sitting. Pt able to sit EOB with BUEs supported ~ 12 mins with intermittent MINA. Pt required total A to complete self feeding tasks while sitting EOB as pt reports feeling too unsteady to complete dynamic tasks related to self- feeding. Pt anxious to return to CIR when medically appropriate. DC plan remains appropriate, will continue to follow acutely per POC.   Follow Up Recommendations  CIR;Supervision/Assistance - 24 hour    Equipment Recommendations  None recommended by OT    Recommendations for Other Services      Precautions / Restrictions Precautions Precautions: Fall;Back Precaution Booklet Issued: No Precaution Comments: L lateral ankle ulcer, L buttock/sacral ulcer Restrictions Weight Bearing Restrictions: No       Mobility Bed Mobility Overal bed mobility: Needs Assistance Bed Mobility: Supine to Sit;Sit to Supine Rolling: +2 for safety/equipment;Max assist Sidelying to sit: +2 for physical assistance;Max assist Supine to sit: Max assist;+2 for physical assistance Sit to supine: Total assist;+2  for physical assistance;HOB elevated   General bed mobility comments: pt requires total - MAX A bed mobility. pt will intiate rolling with BUEs but utlimately requries MAX- total A d/t pain; log roll technique  Transfers                 General transfer comment: defer to safety    Balance Overall balance assessment: Needs assistance Sitting-balance support: Feet supported;Single extremity supported;Bilateral upper extremity supported Sitting balance-Leahy Scale: Poor Sitting balance - Comments: Pt sat EOB for 12 mins with bil UE support and occasional min A from therapist for comfort and safety due to spasms.  Pt was able to remove 1 hand support for ~30 sec at a time.                                   ADL either performed or assessed with clinical judgement   ADL Overall ADL's : Needs assistance/impaired Eating/Feeding: Sitting;Total assistance Eating/Feeding Details (indicate cue type and reason): requried total to drink sitting EOB; pt declined holding cup independently d/t feeling "too unsteady"                       Toilet Transfer Details (indicate cue type and reason): defer transfer but able to transition EOB with MAX A+2 and intermittent MIN A for sitting balance         Functional mobility during ADLs: Maximal assistance;+2 for physical assistance(bed mobility only) General ADL Comments: pt presents with decreased strength and activity tolerance, pain with movement and BLEs spasms     Vision   Additional Comments: pt reports episodes of double vision this  session however he also reports this has been ongoing and doesn't seem to be phases by visual changes. may be d/t coming off anesthesia yesterday   Perception     Praxis      Cognition Arousal/Alertness: Awake/alert Behavior During Therapy: WFL for tasks assessed/performed Overall Cognitive Status: Within Functional Limits for tasks assessed                                  General Comments: pt directs his care very well (aware of precautions, movements that cause spascity, hospital environment); pleasant        Exercises     Shoulder Instructions       General Comments RN enter to assess back incision during session; applied knee pad to pts L knee as pt reports knee often hits underneath bed side table during spasms; pt on 3L throughout session with VSS    Pertinent Vitals/ Pain       Pain Assessment: Faces Faces Pain Scale: Hurts whole lot Pain Location: L lateral ankle, L buttocks/sacrum, spasms with movement Pain Descriptors / Indicators: Grimacing;Spasm;Discomfort;Moaning Pain Intervention(s): Limited activity within patient's tolerance;Monitored during session;Repositioned;Relaxation  Home Living                                          Prior Functioning/Environment              Frequency  Min 2X/week        Progress Toward Goals  OT Goals(current goals can now be found in the care plan section)  Progress towards OT goals: Progressing toward goals  Acute Rehab OT Goals Patient Stated Goal: go home OT Goal Formulation: With patient/family Time For Goal Achievement: 11/12/19 Potential to Achieve Goals: Good  Plan Discharge plan remains appropriate;Frequency remains appropriate    Co-evaluation      Reason for Co-Treatment: For patient/therapist safety PT goals addressed during session: Mobility/safety with mobility        AM-PAC OT "6 Clicks" Daily Activity     Outcome Measure   Help from another person eating meals?: A Little Help from another person taking care of personal grooming?: A Little Help from another person toileting, which includes using toliet, bedpan, or urinal?: Total Help from another person bathing (including washing, rinsing, drying)?: Total Help from another person to put on and taking off regular upper body clothing?: A Lot Help from another person to put on and taking  off regular lower body clothing?: Total 6 Click Score: 11    End of Session Equipment Utilized During Treatment: Oxygen;Other (comment)(3L)  OT Visit Diagnosis: Muscle weakness (generalized) (M62.81);Pain Pain - Right/Left: Left Pain - part of body: Ankle and joints of foot   Activity Tolerance Patient tolerated treatment well   Patient Left in bed;with call bell/phone within reach;with family/visitor present   Nurse Communication          Time: 1030-1101 OT Time Calculation (min): 31 min  Charges: OT General Charges $OT Visit: 1 Visit OT Treatments $Therapeutic Activity: 8-22 mins  Lanier Clam., COTA/L Acute Rehabilitation Services [phone] [place] 11/05/2019, 11:46 AM

## 2019-11-05 NOTE — Progress Notes (Signed)
Neurosurgery Service Progress Note  Subjective: NAE ON, no new complaints  Objective: Vitals:   11/04/19 1705 11/04/19 1724 11/04/19 2233 11/05/19 0500  BP: 135/72 (!) 145/75 (!) 166/96   Pulse: 70 78 89   Resp: 16 16 20    Temp:  98.2 F (36.8 C) 98.5 F (36.9 C)   TempSrc:      SpO2: 97% 96% 98%   Weight:    99.8 kg  Height:       Temp (24hrs), Avg:98.3 F (36.8 C), Min:98.1 F (36.7 C), Max:98.5 F (36.9 C)  CBC Latest Ref Rng & Units 11/05/2019 11/04/2019 11/01/2019  WBC 4.0 - 10.5 K/uL 13.9(H) 18.5(H) 10.6(H)  Hemoglobin 13.0 - 17.0 g/dL 12.3(L) 12.2(L) 12.6(L)  Hematocrit 39.0 - 52.0 % 39.0 39.1 39.9  Platelets 150 - 400 K/uL 204 210 254   BMP Latest Ref Rng & Units 11/05/2019 11/04/2019 11/01/2019  Glucose 70 - 99 mg/dL 150(H) 105(H) 122(H)  BUN 8 - 23 mg/dL 22 21 20   Creatinine 0.61 - 1.24 mg/dL 0.79 0.89 0.94  BUN/Creat Ratio 10 - 24 - - -  Sodium 135 - 145 mmol/L 142 141 142  Potassium 3.5 - 5.1 mmol/L 4.5 4.0 4.0  Chloride 98 - 111 mmol/L 100 98 101  CO2 22 - 32 mmol/L 29 34(H) 34(H)  Calcium 8.9 - 10.3 mg/dL 9.2 9.0 8.9    Intake/Output Summary (Last 24 hours) at 11/05/2019 0740 Last data filed at 11/04/2019 2200 Gross per 24 hour  Intake 1320 ml  Output 1475 ml  Net -155 ml    Current Facility-Administered Medications:  .  acetaminophen (TYLENOL) tablet 650 mg, 650 mg, Oral, Q6H PRN, Smith, Rondell A, MD .  albuterol (PROVENTIL) (2.5 MG/3ML) 0.083% nebulizer solution 2.5 mg, 2.5 mg, Nebulization, Q6H PRN, Smith, Rondell A, MD .  allopurinol (ZYLOPRIM) tablet 300 mg, 300 mg, Oral, Daily, Smith, Rondell A, MD, 300 mg at 11/03/19 0901 .  aspirin EC tablet 81 mg, 81 mg, Oral, Daily, Tamala Julian, Rondell A, MD, 81 mg at 11/03/19 0901 .  atorvastatin (LIPITOR) tablet 20 mg, 20 mg, Oral, q1800, Mariel Aloe, MD, 20 mg at 11/04/19 1743 .  baclofen (LIORESAL) tablet 30 mg, 30 mg, Oral, TID WC, Smith, Rondell A, MD, 30 mg at 11/03/19 1546 .  baclofen (LIORESAL) tablet  40 mg, 40 mg, Oral, QHS, Smith, Rondell A, MD, 40 mg at 11/04/19 2206 .  calcium citrate (CALCITRATE - dosed in mg elemental calcium) tablet 200 mg of elemental calcium, 200 mg of elemental calcium, Oral, Daily, Smith, Rondell A, MD, 200 mg of elemental calcium at 11/03/19 0903 .  carbidopa-levodopa (SINEMET IR) 10-100 MG per tablet immediate release 1 tablet, 1 tablet, Oral, BID, Fuller Plan A, MD, 1 tablet at 11/04/19 2206 .  Chlorhexidine Gluconate Cloth 2 % PADS 6 each, 6 each, Topical, Daily, Mariel Aloe, MD, 6 each at 11/04/19 0900 .  collagenase (SANTYL) ointment, , Topical, Daily, Norval Morton, MD, Given at 11/03/19 1001 .  fluticasone furoate-vilanterol (BREO ELLIPTA) 200-25 MCG/INH 1 puff, 1 puff, Inhalation, Daily, Tamala Julian, Rondell A, MD, 1 puff at 11/04/19 0741 .  furosemide (LASIX) tablet 40 mg, 40 mg, Oral, Daily, Mariel Aloe, MD, 40 mg at 11/03/19 0901 .  gabapentin (NEURONTIN) capsule 100 mg, 100 mg, Oral, QHS, Smith, Rondell A, MD, 100 mg at 11/04/19 2206 .  guaiFENesin (MUCINEX) 12 hr tablet 1,200 mg, 1,200 mg, Oral, BID, Smith, Rondell A, MD, 1,200 mg at 11/03/19 2149 .  HYDROcodone-acetaminophen (NORCO/VICODIN) 5-325 MG per tablet 1 tablet, 1 tablet, Oral, Q6H PRN, Opyd, Ilene Qua, MD, 1 tablet at 11/05/19 0232 .  ipratropium-albuterol (DUONEB) 0.5-2.5 (3) MG/3ML nebulizer solution 3 mL, 3 mL, Nebulization, Q4H PRN, Smith, Rondell A, MD .  labetalol (NORMODYNE) injection 10 mg, 10 mg, Intravenous, Q2H PRN, Opyd, Ilene Qua, MD .  lactated ringers infusion, , Intravenous, Continuous, Oleta Mouse, MD, Last Rate: 10 mL/hr at 11/04/19 1002, Restarted at 11/04/19 1348 .  latanoprost (XALATAN) 0.005 % ophthalmic solution 1 drop, 1 drop, Both Eyes, QHS, Smith, Rondell A, MD, 1 drop at 11/04/19 2207 .  levETIRAcetam (KEPPRA) tablet 250 mg, 250 mg, Oral, BID, Smith, Rondell A, MD, 250 mg at 11/04/19 2206 .  levothyroxine (SYNTHROID) tablet 125 mcg, 125 mcg, Oral, QAC  breakfast, Opyd, Ilene Qua, MD, 125 mcg at 11/05/19 0554 .  methocarbamol (ROBAXIN) 500 mg in dextrose 5 % 50 mL IVPB, 500 mg, Intravenous, Once, Nettey, Ralph A, MD .  methocarbamol (ROBAXIN) tablet 750 mg, 750 mg, Oral, Q6H PRN, Mariel Aloe, MD, 750 mg at 11/05/19 0419 .  metoprolol succinate (TOPROL-XL) 24 hr tablet 50 mg, 50 mg, Oral, Daily, Mariel Aloe, MD, 50 mg at 11/04/19 [id number] .  multivitamin with minerals tablet 1 tablet, 1 tablet, Oral, Daily, Fuller Plan A, MD, 1 tablet at 11/03/19 0901 .  nitroGLYCERIN (NITROSTAT) SL tablet 0.4 mg, 0.4 mg, Sublingual, Q5 min PRN, Smith, Rondell A, MD .  ondansetron (ZOFRAN) tablet 4 mg, 4 mg, Oral, Q6H PRN **OR** ondansetron (ZOFRAN) injection 4 mg, 4 mg, Intravenous, Q6H PRN, Tamala Julian, Rondell A, MD, 4 mg at 11/04/19 2310 .  pantoprazole (PROTONIX) EC tablet 40 mg, 40 mg, Oral, QPM, Smith, Rondell A, MD, 40 mg at 11/04/19 1743 .  polyethylene glycol (MIRALAX / GLYCOLAX) packet 17 g, 17 g, Oral, Daily, Mariel Aloe, MD, 17 g at 11/02/19 0857 .  sodium chloride flush (NS) 0.9 % injection 3 mL, 3 mL, Intravenous, Q12H, Smith, Rondell A, MD, 3 mL at 11/04/19 2207 .  spironolactone (ALDACTONE) tablet 25 mg, 25 mg, Oral, Daily, Mariel Aloe, MD, 25 mg at 11/03/19 0901 .  SUMAtriptan (IMITREX) tablet 50 mg, 50 mg, Oral, Q2H PRN, Fuller Plan A, MD, 50 mg at 11/03/19 0904 .  tamsulosin (FLOMAX) capsule 0.8 mg, 0.8 mg, Oral, QPC supper, Tamala Julian, Rondell A, MD, 0.8 mg at 11/04/19 1745 .  tiZANidine (ZANAFLEX) tablet 2 mg, 2 mg, Oral, TID, Tamala Julian, Rondell A, MD, 2 mg at 11/04/19 2206 .  traZODone (DESYREL) tablet 50 mg, 50 mg, Oral, QHS PRN, Tamala Julian, Rondell A, MD, 50 mg at 10/30/19 2317 .  umeclidinium bromide (INCRUSE ELLIPTA) 62.5 MCG/INH 1 puff, 1 puff, Inhalation, Daily, Mariel Aloe, MD, 1 puff at 11/04/19 0741   Physical Exam: 4+/5 in BUE, BLE with dec'd sensation and strength 3/5 in most groups with occasional spasms in LLE, R DF/PF/EHL  1/5  Assessment & Plan: 79 y.o. man with previously treated cervical myelopathy and resulting spasticity, now with worsening lower extremity function. 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 5/18 s/p L3-4 MIS laminectomy  -hold DVT chemoPPx until 5/20 -activity as tolerated, no restrictions -okay for transfer back to rehab when medically ready, from my perspective, but still on BiPAP/CPAP this morning  Judith Part  11/05/19 7:40 AM

## 2019-11-05 NOTE — Progress Notes (Signed)
Physical Therapy Treatment Patient Details Name: [name] MRN: [id number] DOB: 1940/11/11 Today's Date: 11/05/2019    History of Present Illness [name] is a 79 y.o. 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 Patient presented from inpatient rehab for worsening LE weakness and spasticity likely secondary to severe lumbar stenosis.  Pt now s/p MIS laminectomy L3-4 on 11/04/19.  Per neurosurgeon note no activity restrictions.    PT Comments    Pt is having increased spasms and pain today.  He is POD #1 L3-4 laminectomy.  Additionally, c/o nausea and dizziness that he contributes to anesthesia.  Pt tolerated sitting EOB for 12 mins but required increased assist for transfer and at least 1 UE support for balance.  Cont to progress as able.  Maintained current goals.     Follow Up Recommendations  CIR     Equipment Recommendations  None recommended by PT    Recommendations for Other Services       Precautions / Restrictions Precautions Precautions: Fall;Back Precaution Booklet Issued: No Precaution Comments: L lateral ankle ulcer, L buttock/sacral ulcer Restrictions Weight Bearing Restrictions: No    Mobility  Bed Mobility Overal bed mobility: Needs Assistance Bed Mobility: Supine to Sit;Sit to Supine Rolling: +2 for safety/equipment;Max assist Sidelying to sit: +2 for physical assistance;Max assist Supine to sit: Max assist;+2 for physical assistance Sit to supine: Total assist;+2 for physical assistance;HOB elevated   General bed mobility comments: pt requires total - MAX A bed mobility. pt will intiate rolling with BUEs but utlimately requries MAX- total A d/t pain; log roll  technique  Transfers                 General transfer comment: defer to safety  Ambulation/Gait             General Gait Details: not attempted   Stairs             Wheelchair Mobility    Modified Rankin (Stroke Patients Only)       Balance Overall balance assessment: Needs assistance Sitting-balance support: Feet supported;Single extremity supported;Bilateral upper extremity supported Sitting balance-Leahy Scale: Poor Sitting balance - Comments: Pt sat EOB for 12 mins with bil UE support and occasional min A from therapist for comfort and safety due to spasms.  Pt was able to remove 1 hand support for ~30 sec at a time.                                    Cognition Arousal/Alertness: Awake/alert Behavior During Therapy: WFL for tasks assessed/performed Overall Cognitive Status: Within Functional Limits for tasks assessed                                 General Comments: pt directs his care very well (aware of precautions, movements that cause spascity, hospital environment); pleasant      Exercises      General Comments General comments (skin integrity, edema, etc.): RN enter to assess back incision during session; applied knee pad to pts L knee as pt reports knee often hits underneath bed side table during spasms; pt  on 3L throughout session with VSS   Pt had c/o dizziness in supine and sitting (he felt related to still feeling affects of anesthesia). VSS. Pt also had c/o double vision but reports that has been going on for months.      Pertinent Vitals/Pain Pain Assessment: Faces Faces Pain Scale: Hurts whole lot Pain Location: L lateral ankle, L buttocks/sacrum, spasms with movement Pain Descriptors / Indicators: Grimacing;Spasm;Discomfort;Moaning Pain Intervention(s): Limited activity within patient's tolerance;Monitored during session;Repositioned;Relaxation    Home Living                      Prior  Function            PT Goals (current goals can now be found in the care plan section) Acute Rehab PT Goals Patient Stated Goal: go home PT Goal Formulation: With patient Time For Goal Achievement: 11/19/19 Potential to Achieve Goals: Fair Progress towards PT goals: Not progressing toward goals - comment(pt s/p sx yesterday, pain worse today, kept goals the same)    Frequency    Min 3X/week      PT Plan Current plan remains appropriate    Co-evaluation PT/OT/SLP Co-Evaluation/Treatment: Yes Reason for Co-Treatment: For patient/therapist safety PT goals addressed during session: Mobility/safety with mobility        AM-PAC PT "6 Clicks" Mobility   Outcome Measure  Help needed turning from your back to your side while in a flat bed without using bedrails?: Total Help needed moving from lying on your back to sitting on the side of a flat bed without using bedrails?: Total Help needed moving to and from a bed to a chair (including a wheelchair)?: Total Help needed standing up from a chair using your arms (e.g., wheelchair or bedside chair)?: Total Help needed to walk in hospital room?: Total Help needed climbing 3-5 steps with a railing? : Total 6 Click Score: 6    End of Session   Activity Tolerance: Patient limited by pain Patient left: in bed;with call bell/phone within reach;with family/visitor present Nurse Communication: Mobility status;Need for lift equipment PT Visit Diagnosis: Other abnormalities of gait and mobility (R26.89);Muscle weakness (generalized) (M62.81);Other symptoms and signs involving the nervous system (R29.898)     Time: 1030-1100 PT Time Calculation (min) (ACUTE ONLY): 30 min  Charges:  $Therapeutic Activity: 8-22 mins                     Maggie Font, PT Acute Rehab Services Pager [phone] [facility] [phone] [facility] [phone]    Karlton Lemon 11/05/2019, 11:47 AM

## 2019-11-06 DIAGNOSIS — Z515 Encounter for palliative care: Secondary | ICD-10-CM

## 2019-11-06 DIAGNOSIS — Z66 Do not resuscitate: Secondary | ICD-10-CM

## 2019-11-06 DIAGNOSIS — R531 Weakness: Secondary | ICD-10-CM

## 2019-11-06 LAB — GLUCOSE, CAPILLARY
Glucose-Capillary: 82 mg/dL (ref 70–99)
Glucose-Capillary: 88 mg/dL (ref 70–99)
Glucose-Capillary: 147 mg/dL — ABNORMAL HIGH (ref 70–99)

## 2019-11-06 NOTE — Consult Note (Signed)
Consultation Note Date: 11/06/2019   Patient Name: [name]  DOB: 1941-03-22  MRN: [id number]  Age / Sex: 79 y.o., male  PCP: [name], MD Referring Physician: Hosie Poisson, MD  Reason for Consultation: Establishing goals of care and Psychosocial/spiritual support  HPI/Patient Profile: 79 y.o. 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 Patient presented from inpatient rehab for worsening LE weakness and spasticity likely secondary to severe lumbar stenosis. Neurosurgery managing.  Per Neurosurgery MRI C/T/L with severe canal stenosis at L3-4. 5/18 s/p L3-4 MIS laminectomy.   Patient has had continued physical and functional decline over the past many years.  He has in-home caregivers 3 hours a day 7 days a week.  His wife is very supportive.     Patient and family face ongoing decisions related to treatment options, advanced directive decisions and anticipatory care needs 2/2 to multiple co-morbid ites and high risk for decompensation  Clinical Assessment and Goals of Care:  This NP Wadie Lessen reviewed medical records, received report from team, assessed the patient and then meet at the patient's bedside along with is wife to discuss diagnosis, prognosis, GOC, EOL wishes disposition and options.   Concept of Palliative Care introduced as specialized medical care for people living with serious illness. It focuses on providing relief from the symptoms and stress of a serious illness. The goal is to improve quality of life for both the patient and the family.   A  discussion was had today  regarding advanced directives.  Concepts specific to code status, artifical feeding and hydration, continued IV antibiotics and rehospitalization was had.  The difference between a aggressive medical intervention path  and a palliative comfort care path for this patient at this time was had.  Values and goals of care important to patient and family were attempted to be elicited.   MOST form introduced    Questions and concerns addressed.  Patient  encouraged to call with questions or concerns.     PMT will continue to support holistically.          Wife tells me there is an HPOA and AD documented and she will bring to the hospital      SUMMARY OF RECOMMENDATIONS    Code Status/Advance Care Planning:  DNR    Symptom Management:   Weakness:   hopeful for readmission to CIR, continued rehabilitation   Education and demonstration of AROM exercise for upper body strength with simple arm lifts  Palliative Prophylaxis:   Aspiration, Bowel Regimen, Delirium Protocol, Frequent Pain Assessment and Oral Care  Additional Recommendations (Limitations, Scope, Preferences):  Full Scope Treatment  Psycho-social/Spiritual:   Desire for further Chaplaincy support:no  Additional Recommendations: Created space and opportunity for patient and his wife to explore the thoughts and feelings regarding patient's current  medical situation.  Both understand the seriousness of the situation and remain hopeful for improvement and ultimately return home to his previous baseline.  Prognosis:   Unable to determine  Discharge Planning: To Be Determined      Primary Diagnoses: Present on Admission: . Incomplete paraplegia ([place]) . Essential hypertension . Thoracic aortic aneurysm ([place])   I have reviewed the medical record, interviewed the patient and family, and examined the patient. The following aspects are pertinent.  Past Medical History:  Diagnosis Date  . Arthritis   . Barrett  esophagus   . Bronchitis   . Carotid artery occlusion   . COPD (chronic obstructive pulmonary disease) ([place])   . Diabetes mellitus without complication ([place])   . GERD (gastroesophageal reflux disease)   . Headache    migraines  . History of thyroid cancer 2008  . Hypertension   . Restless leg   . Sleep apnea with use of continuous positive airway pressure (CPAP)    2011 [facility] , [facility]  77cn central and obstrcutive. 16 cm water , 3 cm EPR.   . Thoracic ascending aortic aneurysm ([facility])    4.2 cm ascending TAA 08/18/17 CTA. Annual imaging recommended.  . thyroid ca dx'd 2009 or 2010   surg and radioactive isoptope  . Ulcer    Peptic ulcer disease   Social History   Socioeconomic History  . Marital status: Married    Spouse name: [name]  . Number of children: 2  . Years of education: COLLEGE  . Highest education level: Not on file  Occupational History    Employer: RETIRED  Tobacco Use  . Smoking status: Former Smoker    Years: 40.00    Types: Cigars, Cigarettes    Quit date: 06/19/2005    Years since quitting: 14.3  . Smokeless tobacco: Never Used  . Tobacco comment: smoked 4-6 cigars daily, only smoked cigarettes socially  Substance and Sexual Activity  . Alcohol use: No  . Drug use: No  . Sexual activity: Not on file  Other Topics Concern  . Not on file  Social History Narrative   Patient is married [name]) and lives at home with his wife.   Patient has two children.   Patient is retired.   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   Patient is right-handed.   Patient drinks three cups of coffee per day.   Social Determinants of Health   Financial Resource Strain:   . Difficulty of Paying Living Expenses:   Food Insecurity:   . Worried About Charity fundraiser in the Last Year:   . Arboriculturist in the Last Year:   Transportation Needs:   . Film/video editor (Medical):   [name] Lack of Transportation (Non-Medical):   Physical  Activity:   . Days of Exercise per Week:   . Minutes of Exercise per Session:   Stress:   . Feeling of Stress :   Social Connections:   . Frequency of Communication with Friends and Family:   . Frequency of Social Gatherings with Friends and Family:   . Attends Religious Services:   . Active Member of Clubs or Organizations:   . Attends Archivist Meetings:   [name] Marital Status:    Family History  Problem Relation Age of Onset  . COPD Mother   . Hyperlipidemia Mother   . Hypertension Mother   . Diabetes Father   . Kidney disease Father  ESRD  . Hypertension Father   . Cancer Father   . Hyperlipidemia Father    Scheduled Meds: . allopurinol  300 mg Oral Daily  . aspirin EC  81 mg Oral Daily  . atorvastatin  20 mg Oral q1800  . baclofen  30 mg Oral TID WC  . baclofen  40 mg Oral QHS  . calcium citrate  200 mg of elemental calcium Oral Daily  . carbidopa-levodopa  1 tablet Oral BID  . Chlorhexidine Gluconate Cloth  6 each Topical Daily  . collagenase   Topical Daily  . fluticasone furoate-vilanterol  1 puff Inhalation Daily  . furosemide  40 mg Oral Daily  . gabapentin  100 mg Oral QHS  . guaiFENesin  1,200 mg Oral BID  . latanoprost  1 drop Both Eyes QHS  . levETIRAcetam  250 mg Oral BID  . levothyroxine  125 mcg Oral QAC breakfast  . metoprolol succinate  50 mg Oral Daily  . multivitamin with minerals  1 tablet Oral Daily  . pantoprazole  40 mg Oral QPM  . polyethylene glycol  17 g Oral Daily  . sodium chloride flush  3 mL Intravenous Q12H  . spironolactone  25 mg Oral Daily  . tamsulosin  0.8 mg Oral QPC supper  . tiZANidine  2 mg Oral TID  . umeclidinium bromide  1 puff Inhalation Daily   Continuous Infusions: . lactated ringers 10 mL/hr at 11/04/19 1002  . methocarbamol (ROBAXIN) IV     PRN Meds:.acetaminophen, albuterol, calcium carbonate, HYDROcodone-acetaminophen, ipratropium-albuterol, labetalol, methocarbamol, nitroGLYCERIN, ondansetron  **OR** ondansetron (ZOFRAN) IV, SUMAtriptan, traZODone Medications Prior to Admission:  Prior to Admission medications   Medication Sig Start Date End Date Taking? Authorizing Provider  furosemide (LASIX) 40 MG tablet Take 40 mg by mouth daily.   Yes [provider]  metoprolol succinate (TOPROL-XL) 50 MG 24 hr tablet Take 50 mg by mouth daily. Take with or immediately following a meal.   Yes [provider]  rizatriptan (MAXALT) 10 MG tablet Take 10 mg by mouth as needed for migraine. May repeat in 2 hours if needed   Yes [provider]  spironolactone (ALDACTONE) 25 MG tablet Take 25 mg by mouth 2 (two) times daily.   Yes [provider]  acetaminophen (TYLENOL) 325 MG tablet Take 2 tablets (650 mg total) by mouth every 6 (six) hours as needed (pain or temperature). 10/24/19   Angiulli, Lavon Paganini, PA-C  albuterol (PROVENTIL) (2.5 MG/3ML) 0.083% nebulizer solution Inhale 3 mLs (2.5 mg total) into the lungs every 4 (four) hours as needed for wheezing or shortness of breath. 10/24/19   Angiulli, Lavon Paganini, PA-C  allopurinol (ZYLOPRIM) 300 MG tablet Take 300 mg by mouth daily.  06/02/15   [provider]  aspirin EC 81 MG EC tablet Take 1 tablet (81 mg total) by mouth daily. 10/24/19   Angiulli, Lavon Paganini, PA-C  baclofen (LIORESAL) 10 MG tablet TAKE 1 TABLET BY MOUTH 3 TIMES DAILY. Patient taking differently: Take 10 mg by mouth 3 (three) times daily.  06/23/19   Ward Givens, NP  baclofen (LIORESAL) 20 MG tablet Take 2 tablets (40 mg total) by mouth at bedtime. 10/24/19   Angiulli, Lavon Paganini, PA-C  calcium citrate (CALCITRATE - DOSED IN MG ELEMENTAL CALCIUM) 950 (200 Ca) MG tablet Take 1 tablet (200 mg of elemental calcium total) by mouth daily. 10/24/19   Angiulli, Lavon Paganini, PA-C  carbidopa-levodopa (SINEMET IR) 10-100 MG tablet Take 1 tablet by  mouth 2 (two) times daily.     [provider]  cholecalciferol (VITAMIN D) 1000 units tablet Take 1,000 Units by  mouth daily.     [provider]  colchicine 0.6 MG tablet Take 0.6 mg by mouth daily.    [provider]  collagenase (SANTYL) ointment Apply topically daily. 10/24/19   Angiulli, Lavon Paganini, PA-C  enoxaparin (LOVENOX) 40 MG/0.4ML injection Inject 0.4 mLs (40 mg total) into the skin daily. 10/24/19   Angiulli, Lavon Paganini, PA-C  fluticasone furoate-vilanterol (BREO ELLIPTA) 200-25 MCG/INH AEPB Inhale 1 puff into the lungs daily. 10/24/19   Angiulli, Lavon Paganini, PA-C  gabapentin (NEURONTIN) 100 MG capsule Take 1 capsule (100 mg total) by mouth at bedtime. 09/16/19   Ward Givens, NP  guaiFENesin (MUCINEX) 600 MG 12 hr tablet Take 2 tablets (1,200 mg total) by mouth 2 (two) times daily. 10/24/19   Angiulli, Lavon Paganini, PA-C  latanoprost (XALATAN) 0.005 % ophthalmic solution Place 1 drop into both eyes at bedtime. 10/24/19   Angiulli, Lavon Paganini, PA-C  levETIRAcetam (KEPPRA) 250 MG tablet Take 250 mg by mouth 2 (two) times daily. 08/15/19   [provider]  levothyroxine (SYNTHROID) 125 MCG tablet Take 125 mcg by mouth daily before breakfast.     [provider]  Multiple Vitamin (MULTIVITAMIN) tablet Take 1 tablet by mouth daily.    [provider]  nitroGLYCERIN (NITROSTAT) 0.4 MG SL tablet Place 0.4 mg every 5 (five) minutes as needed under the tongue for chest pain.    [provider]  pantoprazole (PROTONIX) 40 MG tablet Take 40 mg by mouth every evening.    [provider]  polyethylene glycol powder (MIRALAX) 17 GM/SCOOP powder Take by mouth as directed. 17 gm every other day     [provider]  simvastatin (ZOCOR) 40 MG tablet Take 40 mg by mouth daily at 6 PM.  08/07/17   [provider]  tamsulosin (FLOMAX) 0.4 MG CAPS capsule Take 2 capsules (0.8 mg total) by mouth daily after supper. 10/24/19   Angiulli, Lavon Paganini, PA-C  tiZANidine (ZANAFLEX) 2 MG tablet Take 1 tablet (2 mg total) by mouth 3 (three) times daily. 10/24/19   Angiulli,  Lavon Paganini, PA-C  traZODone (DESYREL) 50 MG tablet Take 1 tablet (50 mg total) by mouth at bedtime as needed for sleep. 10/24/19   Angiulli, Lavon Paganini, PA-C  TRELEGY ELLIPTA 100-62.5-25 MCG/INH AEPB Inhale 1 puff into the lungs daily.  05/01/18   [provider]  umeclidinium bromide (INCRUSE ELLIPTA) 62.5 MCG/INH AEPB Inhale 1 puff into the lungs daily. 10/24/19   Angiulli, Lavon Paganini, PA-C   No Known Allergies Review of Systems  Neurological: Positive for weakness.    Physical Exam Cardiovascular:     Rate and Rhythm: Normal rate.  Musculoskeletal:     Comments: generalized weakness  Skin:    General: Skin is warm and dry.  Neurological:     Mental Status: He is alert and oriented to person, place, and time.     Vital Signs: BP 120/71 (BP Location: Left Wrist) Comment (BP Location): pt refused RUE - educated that the LUE should be restricted   Pulse 66   Temp 98.9 F (37.2 C) (Axillary)   Resp 18   Ht 5\' 7"  (1.702 m)   Wt 99.8 kg   SpO2 98%   BMI 34.46 kg/m  Pain Scale: 0-10   Pain Score: 0-No pain   SpO2: SpO2: 98 % O2 Device:SpO2:  98 % O2 Flow Rate: .O2 Flow Rate (L/min): 4 L/min  IO: Intake/output summary:   Intake/Output Summary (Last 24 hours) at 11/06/2019 1033 Last data filed at 11/05/2019 2045 Gross per 24 hour  Intake --  Output 1450 ml  Net -1450 ml    LBM: Last BM Date: 11/05/19 Baseline Weight: Weight: 99.3 kg Most recent weight: Weight: 99.8 kg     Palliative Assessment/Data:   30 %a t best   Discussed with Dr Karleen Hampshire   This nurse practitioner informed  the patient/family and the attending that I will be out of the hospital until Sunday morning.  If the patient is still hospitalized I will follow-up at that time.  Call palliative medicine team phone # [phone] with questions or concerns.  Time In: 1150 Time Out: 1300 Time Total: 70 minutes Greater than 50%  of this time was spent counseling and coordinating care related to the above  assessment and plan.  Signed by: Wadie Lessen, NP   Please contact Palliative Medicine Team phone at [phone] for questions and concerns.  For individual provider: See Shea Evans

## 2019-11-06 NOTE — Progress Notes (Signed)
PROGRESS NOTE    [name]  [id number] DOB: 1941-02-25 DOA: 10/28/2019 PCP: [name], MD    No chief complaint on file.   Brief Narrative:  [name] is a 79 y.o. 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 Patient presented from inpatient rehab for worsening LE weakness and spasticity likely secondary to severe lumbar stenosis. Neurosurgery on board.  MRI showed severe spinal stenosis at the level of L3 and L4 patient underwent laminectomy on 11/04/2019 and PT, OT evaluation recommending CIR.  CIR consult in place and TOC aware. 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 S/p surgery with laminectomy on 11/04/2019 Robaxin, baclofen.,  Pain control and ambulate as tolerated.  Will add DVT prophylaxis as per neurosurgery recommendations. PT OT recommending CIR.   Acute on chronic respiratory failure with hypoxia complicated by left hemidiaphragm paralysis. Patient on 2 L of nasal cannula as outpatient. Continue with incentive spirometry and CPAP at night.   Ascending aortic aneurysm CT angiogram of the chest showed a 4.2 cm ascending thoracic aortic aneurysm Recommend outpatient follow-up with PCP and yearly CTA versus ultrasound. Continue with simvastatin daily.  Essential hypertension Blood pressure parameters are well  controlled.   Hyperlipidemia Continue with simvastatin   History of chronic diastolic heart failure. Appears to be compensated continue with Lasix 40 mg daily and spironolactone milligrams daily.   Restless leg syndrome Continue with home medications.   Hypothyroidism Continue with Synthroid 125 MCG daily.   Obstructive sleep apnea Continue CPAP at bedtime.   Constipation Resolved.   Myoclonic jerking Continue with Keppra.  Body mass index is 34.46 kg/m. Outpatient follow-up with PCP  BPH continue with Flomax   Leukocytosis Improving Appears to be reactive.   DVT prophylaxis: SCDs Code Status: Full code Family Communication: None at bedside Disposition:   Status is: Inpatient  Remains inpatient appropriate because:Inpatient level of care appropriate due to severity of illness   Dispo: The patient is from: CIR              Anticipated d/c is to: CIR              Anticipated d/c date is: 1 day              Patient currently is medically stable to d/c.        Consultants:   Neurosurgery   Procedures:5/18 s/p L3-4 MIS laminectomy   Antimicrobials:  Anti-infectives (From admission, onward)   Start     Dose/Rate Route Frequency Ordered Stop   11/04/19 1342  bacitracin 50,000 Units in sodium chloride 0.9 % 500 mL irrigation  Status:  Discontinued       As needed 11/04/19 1342 11/04/19 1459   11/04/19 1215  ceFAZolin (ANCEF) IVPB 2g/100 mL premix     2 g 200 mL/hr over 30 Minutes Intravenous  Once 11/04/19 1209 11/04/19 1326   11/04/19 1211  ceFAZolin (ANCEF) 2-4 GM/100ML-% IVPB    Note to Pharmacy: Sherren Mocha,  Robert   : cabinet override      11/04/19 1211 11/04/19 1339   10/29/19 0100  vancomycin (VANCOREADY) IVPB 1250 mg/250 mL  Status:  Discontinued     1,250 mg 166.7 mL/hr over 90 Minutes Intravenous Every 12 hours 10/28/19 2036 10/30/19 1228   10/28/19 2300  ceFEPIme (MAXIPIME) 2 g in sodium chloride 0.9 % 100 mL IVPB  Status:  Discontinued      2 g 200 mL/hr over 30 Minutes Intravenous Every 8 hours 10/28/19 2036 10/30/19 1228         Subjective: Patient was still on CPAP this morning on exam, denied any new complaints  Objective: Vitals:   11/05/19 1550 11/05/19 1627 11/05/19 2225 11/06/19 1000  BP: 108/66 104/64 (!) 99/58 120/71  Pulse: 79 72 82 66  Resp: 19 16  18   Temp: 98.7 F (37.1 C) 98.2 F (36.8 C) 98.2 F (36.8 C) 98.9 F (37.2 C)  TempSrc:    Axillary  SpO2: 94% 94% 94% 98%  Weight:      Height:        Intake/Output Summary (Last 24 hours) at 11/06/2019 1225 Last data filed at 11/06/2019 1041 Gross per 24 hour  Intake --  Output 2250 ml  Net -2250 ml   Filed Weights   10/30/19 0500 11/04/19 0500 11/05/19 0500  Weight: 100.7 kg 100.5 kg 99.8 kg    Examination:  General exam: Alert and comfortable, on CPAP Respiratory system: Clear to auscultation bilaterally, no wheezing or rhonchi Cardiovascular system: S1-S2 heard, regular rate rhythm, no JVD Gastrointestinal system: Abdomen is soft, nontender, nondistended, bowel sounds normal Central nervous system: Alert and oriented, grossly nonfocal Extremities: No cyanosis or clubbing Skin: No rashes seen Psychiatry: Mood is appropriate   Data Reviewed: I have personally reviewed following labs and imaging studies  CBC: Recent Labs  Lab 11/01/19 0522 11/04/19 0239 11/05/19 0243  WBC 10.6* 18.5* 13.9*  NEUTROABS  --   --  12.1*  HGB 12.6* 12.2* 12.3*  HCT 39.9 39.1 39.0  MCV 89.5 91.1 90.5  PLT 254 210 0000000    Basic Metabolic Panel: Recent Labs  Lab 10/31/19 0500 11/01/19 0522 11/04/19 0239 11/05/19 0243  NA 142 142 141 142  K 3.3* 4.0 4.0 4.5  CL 97* 101 98 100  CO2 33* 34* 34* 29  GLUCOSE 102* 122* 105* 150*  BUN 18 20 21 22   CREATININE 1.04 0.94 0.89 0.79  CALCIUM 9.1 8.9 9.0 9.2    GFR: Estimated Creatinine Clearance: 85.7 mL/min (by C-G formula based on SCr of 0.79 mg/dL).  Liver Function Tests: Recent Labs  Lab  11/05/19 0243  AST 18  ALT 13  ALKPHOS 75  BILITOT 0.4  PROT 6.8  ALBUMIN 3.5    CBG: Recent Labs  Lab 11/05/19 0805 11/05/19 1641 11/05/19 2030 11/06/19 0750 11/06/19 1128  GLUCAP 120* 124* 98 82 88     Recent Results (from the past 240 hour(s))  Culture, Urine     Status: None   Collection Time: 10/28/19  1:20 PM   Specimen: Urine, Random  Result Value Ref Range Status   Specimen Description URINE, RANDOM  Final   Special Requests NONE  Final   Culture   Final    NO GROWTH Performed at [facility], [place] [address] [place],  [address]    Report Status 10/29/2019 FINAL  Final         Radiology Studies: DG Lumbar Spine 1 View  Result Date: 11/04/2019 CLINICAL DATA:  Lumbar surgery. EXAM: LUMBAR SPINE - 1 VIEW COMPARISON:  CT 10/22/2019. FINDINGS: Lumbar spine numbered the lowest segmented appearing lumbar shaped vertebrae on lateral view as L5. Surgical instrument is noted posteriorly at the level of L4. IMPRESSION: Postsurgical changes lumbar spine. Electronically Signed   By: Marcello Moores  Register   On: 11/04/2019 15:06   DG C-Arm 1-60 Min  Result Date: 11/04/2019 CLINICAL DATA:  L3-L4 laminectomy. EXAM: DG C-ARM 1-60 MIN CONTRAST:  None. FLUOROSCOPY TIME:  Fluoroscopy Time:  0 minutes 9 seconds Number of Acquired Spot Images: 1 COMPARISON:  CT lumbar spine 10/22/2019. FINDINGS: Lumbar spine numbered the lowest segmented appearing lumbar shaped vertebrae on lateral view as L5. Metallic surgical instrument noted posterior to the level of L4. IMPRESSION: Postsurgical changes lumbar spine. Electronically Signed   By: [place]   On: 11/04/2019 15:34        Scheduled Meds: . allopurinol  300 mg Oral Daily  . aspirin EC  81 mg Oral Daily  . atorvastatin  20 mg Oral q1800  . baclofen  30 mg Oral TID WC  . baclofen  40 mg Oral QHS  . calcium citrate  200 mg of elemental calcium Oral Daily  . carbidopa-levodopa  1 tablet Oral BID  . Chlorhexidine  Gluconate Cloth  6 each Topical Daily  . collagenase   Topical Daily  . fluticasone furoate-vilanterol  1 puff Inhalation Daily  . furosemide  40 mg Oral Daily  . gabapentin  100 mg Oral QHS  . guaiFENesin  1,200 mg Oral BID  . latanoprost  1 drop Both Eyes QHS  . levETIRAcetam  250 mg Oral BID  . levothyroxine  125 mcg Oral QAC breakfast  . metoprolol succinate  50 mg Oral Daily  . multivitamin with minerals  1 tablet Oral Daily  . pantoprazole  40 mg Oral QPM  . polyethylene glycol  17 g Oral Daily  . sodium chloride flush  3 mL Intravenous Q12H  . spironolactone  25 mg Oral Daily  . tamsulosin  0.8 mg Oral QPC supper  . tiZANidine  2 mg Oral TID  . umeclidinium bromide  1 puff Inhalation Daily   Continuous Infusions: . lactated ringers 10 mL/hr at 11/04/19 1002  . methocarbamol (ROBAXIN) IV       LOS: 9 days        Hosie Poisson, MD [facility]   To contact the attending provider between 7A-7P or the covering provider during after hours 7P-7A, please log into the web site [email or web address] and access using universal [place] password for that web site. If you do not have the password, please call the hospital operator.  11/06/2019, 12:25 PM

## 2019-11-06 NOTE — Progress Notes (Signed)
Rehab Admissions Coordinator Note:  Per PT and OT recommendation, this patient was screened by Raechel Ache for appropriateness for an Inpatient Acute Rehab Consult.  At this time, we are recommending an Inpatient Rehab consult. AC will place consult order in the chart per protocol to allow [place] to follow up for further assessment and discussion about a possible return to CIR.   Raechel Ache 11/06/2019, 8:11 AM  I can be reached at [phone].

## 2019-11-06 NOTE — Progress Notes (Signed)
Inpatient Rehabilitation-Admissions Coordinator    I met with pt and his wife at the bedside for rehabilitation assessment (Palliative Care also in the room and engaged in discussion). Pt had a hard time keeping his eyes open but was able to participate in assessment/conversation when prompted. Pt and his wife want to return to CIR. Discussed that before I can open his insurance case for possible return, I will need to see pt tolerate OOB activity as this will give him the best shot at insurance approval. Pt appears motivated and has high goals for himself. AC will contact acute therapists to see if he can be seen tomorrow and see if he can be lifted to a recliner, if appropriate per their clinical judgement at that time.   Will follow up with pt tomorrow.   Raechel Ache, OTR/L  Rehab Admissions Coordinator  [phone] 11/06/2019 12:34 PM

## 2019-11-06 NOTE — Progress Notes (Signed)
Neurosurgery Service Progress Note  Subjective: NAE ON  Objective: Vitals:   11/05/19 0804 11/05/19 1550 11/05/19 1627 11/05/19 2225  BP: (!) 141/74 108/66 104/64 (!) 99/58  Pulse: 83 79 72 82  Resp: 19 19 16    Temp: 98.7 F (37.1 C) 98.7 F (37.1 C) 98.2 F (36.8 C) 98.2 F (36.8 C)  TempSrc:      SpO2: 95% 94% 94% 94%  Weight:      Height:       Temp (24hrs), Avg:98.4 F (36.9 C), Min:98.2 F (36.8 C), Max:98.7 F (37.1 C)  CBC Latest Ref Rng & Units 11/05/2019 11/04/2019 11/01/2019  WBC 4.0 - 10.5 K/uL 13.9(H) 18.5(H) 10.6(H)  Hemoglobin 13.0 - 17.0 g/dL 12.3(L) 12.2(L) 12.6(L)  Hematocrit 39.0 - 52.0 % 39.0 39.1 39.9  Platelets 150 - 400 K/uL 204 210 254   BMP Latest Ref Rng & Units 11/05/2019 11/04/2019 11/01/2019  Glucose 70 - 99 mg/dL 150(H) 105(H) 122(H)  BUN 8 - 23 mg/dL 22 21 20   Creatinine 0.61 - 1.24 mg/dL 0.79 0.89 0.94  BUN/Creat Ratio 10 - 24 - - -  Sodium 135 - 145 mmol/L 142 141 142  Potassium 3.5 - 5.1 mmol/L 4.5 4.0 4.0  Chloride 98 - 111 mmol/L 100 98 101  CO2 22 - 32 mmol/L 29 34(H) 34(H)  Calcium 8.9 - 10.3 mg/dL 9.2 9.0 8.9    Intake/Output Summary (Last 24 hours) at 11/06/2019 1012 Last data filed at 11/05/2019 2045 Gross per 24 hour  Intake --  Output 1450 ml  Net -1450 ml    Current Facility-Administered Medications:  .  acetaminophen (TYLENOL) tablet 650 mg, 650 mg, Oral, Q6H PRN, Smith, Rondell A, MD .  albuterol (PROVENTIL) (2.5 MG/3ML) 0.083% nebulizer solution 2.5 mg, 2.5 mg, Nebulization, Q6H PRN, Smith, Rondell A, MD .  allopurinol (ZYLOPRIM) tablet 300 mg, 300 mg, Oral, Daily, Tamala Julian, Rondell A, MD, 300 mg at 11/05/19 0842 .  aspirin EC tablet 81 mg, 81 mg, Oral, Daily, Tamala Julian, Rondell A, MD, 81 mg at 11/05/19 0838 .  atorvastatin (LIPITOR) tablet 20 mg, 20 mg, Oral, q1800, Mariel Aloe, MD, 20 mg at 11/05/19 1645 .  baclofen (LIORESAL) tablet 30 mg, 30 mg, Oral, TID WC, Smith, Rondell A, MD, 30 mg at 11/06/19 0520 .  baclofen  (LIORESAL) tablet 40 mg, 40 mg, Oral, QHS, Smith, Rondell A, MD, 40 mg at 11/05/19 2237 .  calcium carbonate (TUMS - dosed in mg elemental calcium) chewable tablet 200 mg of elemental calcium, 1 tablet, Oral, PRN, Hosie Poisson, MD, 200 mg of elemental calcium at 11/05/19 2334 .  calcium citrate (CALCITRATE - dosed in mg elemental calcium) tablet 200 mg of elemental calcium, 200 mg of elemental calcium, Oral, Daily, Smith, Rondell A, MD, 200 mg of elemental calcium at 11/05/19 0842 .  carbidopa-levodopa (SINEMET IR) 10-100 MG per tablet immediate release 1 tablet, 1 tablet, Oral, BID, Fuller Plan A, MD, 1 tablet at 11/05/19 2231 .  Chlorhexidine Gluconate Cloth 2 % PADS 6 each, 6 each, Topical, Daily, Mariel Aloe, MD, 6 each at 11/05/19 [id number] .  collagenase (SANTYL) ointment, , Topical, Daily, Norval Morton, MD, Given at 11/05/19 [phone] .  fluticasone furoate-vilanterol (BREO ELLIPTA) 200-25 MCG/INH 1 puff, 1 puff, Inhalation, Daily, Tamala Julian, Rondell A, MD, 1 puff at 11/05/19 0741 .  furosemide (LASIX) tablet 40 mg, 40 mg, Oral, Daily, Mariel Aloe, MD, 40 mg at 11/05/19 0841 .  gabapentin (NEURONTIN) capsule 100 mg, 100 mg,  Oral, QHS, Smith, Rondell A, MD, 100 mg at 11/05/19 2231 .  guaiFENesin (MUCINEX) 12 hr tablet 1,200 mg, 1,200 mg, Oral, BID, Smith, Rondell A, MD, 1,200 mg at 11/05/19 2231 .  HYDROcodone-acetaminophen (NORCO/VICODIN) 5-325 MG per tablet 1 tablet, 1 tablet, Oral, Q6H PRN, Opyd, Ilene Qua, MD, 1 tablet at 11/05/19 2149 .  ipratropium-albuterol (DUONEB) 0.5-2.5 (3) MG/3ML nebulizer solution 3 mL, 3 mL, Nebulization, Q4H PRN, Smith, Rondell A, MD .  labetalol (NORMODYNE) injection 10 mg, 10 mg, Intravenous, Q2H PRN, Opyd, Ilene Qua, MD .  lactated ringers infusion, , Intravenous, Continuous, Oleta Mouse, MD, Last Rate: 10 mL/hr at 11/04/19 1002, Restarted at 11/04/19 1348 .  latanoprost (XALATAN) 0.005 % ophthalmic solution 1 drop, 1 drop, Both Eyes, QHS, Smith,  Rondell A, MD, 1 drop at 11/05/19 2233 .  levETIRAcetam (KEPPRA) tablet 250 mg, 250 mg, Oral, BID, Smith, Rondell A, MD, 250 mg at 11/05/19 2231 .  levothyroxine (SYNTHROID) tablet 125 mcg, 125 mcg, Oral, QAC breakfast, Opyd, Ilene Qua, MD, 125 mcg at 11/06/19 0520 .  methocarbamol (ROBAXIN) 500 mg in dextrose 5 % 50 mL IVPB, 500 mg, Intravenous, Once, Nettey, Ralph A, MD .  methocarbamol (ROBAXIN) tablet 750 mg, 750 mg, Oral, Q6H PRN, Mariel Aloe, MD, 750 mg at 11/05/19 2334 .  metoprolol succinate (TOPROL-XL) 24 hr tablet 50 mg, 50 mg, Oral, Daily, Mariel Aloe, MD, 50 mg at 11/05/19 0840 .  multivitamin with minerals tablet 1 tablet, 1 tablet, Oral, Daily, Fuller Plan A, MD, 1 tablet at 11/03/19 0901 .  nitroGLYCERIN (NITROSTAT) SL tablet 0.4 mg, 0.4 mg, Sublingual, Q5 min PRN, Smith, Rondell A, MD .  ondansetron (ZOFRAN) tablet 4 mg, 4 mg, Oral, Q6H PRN **OR** ondansetron (ZOFRAN) injection 4 mg, 4 mg, Intravenous, Q6H PRN, Tamala Julian, Rondell A, MD, 4 mg at 11/04/19 2310 .  pantoprazole (PROTONIX) EC tablet 40 mg, 40 mg, Oral, QPM, Smith, Rondell A, MD, 40 mg at 11/05/19 1650 .  polyethylene glycol (MIRALAX / GLYCOLAX) packet 17 g, 17 g, Oral, Daily, Mariel Aloe, MD, 17 g at 11/02/19 0857 .  sodium chloride flush (NS) 0.9 % injection 3 mL, 3 mL, Intravenous, Q12H, Smith, Rondell A, MD, 3 mL at 11/05/19 2233 .  spironolactone (ALDACTONE) tablet 25 mg, 25 mg, Oral, Daily, Mariel Aloe, MD, 25 mg at 11/05/19 0840 .  SUMAtriptan (IMITREX) tablet 50 mg, 50 mg, Oral, Q2H PRN, Fuller Plan A, MD, 50 mg at 11/03/19 0904 .  tamsulosin (FLOMAX) capsule 0.8 mg, 0.8 mg, Oral, QPC supper, Tamala Julian, Rondell A, MD, 0.8 mg at 11/05/19 1646 .  tiZANidine (ZANAFLEX) tablet 2 mg, 2 mg, Oral, TID, Tamala Julian, Rondell A, MD, 2 mg at 11/05/19 2231 .  traZODone (DESYREL) tablet 50 mg, 50 mg, Oral, QHS PRN, Tamala Julian, Rondell A, MD, 50 mg at 10/30/19 2317 .  umeclidinium bromide (INCRUSE ELLIPTA) 62.5 MCG/INH 1  puff, 1 puff, Inhalation, Daily, Mariel Aloe, MD, 1 puff at 11/05/19 0741   Physical Exam: 4+/5 in BUE, BLE with dec'd sensation and strength 3/5 in most groups with occasional spasms in LLE, R DF/PF/EHL 1/5  Assessment & Plan: 79 y.o. man with previously treated cervical myelopathy and resulting spasticity, now with worsening lower extremity function. 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 5/18 s/p L3-4 MIS laminectomy  -okay for DVT chemoPPx -activity as tolerated, no restrictions -okay for transfer back to rehab when medically ready, from my perspective, but still on BiPAP/CPAP this morning  Joyice Faster Kamare Caspers  11/06/19 10:12 AM

## 2019-11-07 LAB — GLUCOSE, CAPILLARY
Glucose-Capillary: 87 mg/dL (ref 70–99)
Glucose-Capillary: 165 mg/dL — ABNORMAL HIGH (ref 70–99)
Glucose-Capillary: 117 mg/dL — ABNORMAL HIGH (ref 70–99)
Glucose-Capillary: 155 mg/dL — ABNORMAL HIGH (ref 70–99)

## 2019-11-07 MED ORDER — HEPARIN SODIUM (PORCINE) 5000 UNIT/ML IJ SOLN
5000.0000 [IU] | Freq: Three times a day (TID) | INTRAMUSCULAR | Status: DC
  Administered 2019-11-07 – 2019-11-10 (×10): 5000 [IU] via SUBCUTANEOUS
  Filled 2019-11-07 (×10): qty 1

## 2019-11-07 NOTE — Progress Notes (Signed)
Inpatient Rehabilitation-Admissions Coordinator   Noted good progress today with therapies. Pt was able to transfer OOB today. Will begin insurance authorization process for possible admit.   Will update once there has been a determination.   Raechel Ache, OTR/L  Rehab Admissions Coordinator  [phone] 11/07/2019 1:52 PM

## 2019-11-07 NOTE — Progress Notes (Signed)
Neurosurgery Service Progress Note  Subjective: NAE ON, feels much better today energy-wise, felt pretty wiped out yesterday  Objective: Vitals:   11/06/19 2300 11/07/19 0500 11/07/19 0736 11/07/19 0845  BP:   119/80   Pulse: 80  62   Resp: 20  16   Temp:   97.6 F (36.4 C)   TempSrc:      SpO2: 96%  99% 95%  Weight:  94.8 kg    Height:       Temp (24hrs), Avg:97.8 F (36.6 C), Min:97.6 F (36.4 C), Max:98 F (36.7 C)  CBC Latest Ref Rng & Units 11/05/2019 11/04/2019 11/01/2019  WBC 4.0 - 10.5 K/uL 13.9(H) 18.5(H) 10.6(H)  Hemoglobin 13.0 - 17.0 g/dL 12.3(L) 12.2(L) 12.6(L)  Hematocrit 39.0 - 52.0 % 39.0 39.1 39.9  Platelets 150 - 400 K/uL 204 210 254   BMP Latest Ref Rng & Units 11/05/2019 11/04/2019 11/01/2019  Glucose 70 - 99 mg/dL 150(H) 105(H) 122(H)  BUN 8 - 23 mg/dL 22 21 20   Creatinine 0.61 - 1.24 mg/dL 0.79 0.89 0.94  BUN/Creat Ratio 10 - 24 - - -  Sodium 135 - 145 mmol/L 142 141 142  Potassium 3.5 - 5.1 mmol/L 4.5 4.0 4.0  Chloride 98 - 111 mmol/L 100 98 101  CO2 22 - 32 mmol/L 29 34(H) 34(H)  Calcium 8.9 - 10.3 mg/dL 9.2 9.0 8.9    Intake/Output Summary (Last 24 hours) at 11/07/2019 1003 Last data filed at 11/07/2019 [id number] Gross per 24 hour  Intake 250 ml  Output 2450 ml  Net -2200 ml    Current Facility-Administered Medications:  .  acetaminophen (TYLENOL) tablet 650 mg, 650 mg, Oral, Q6H PRN, Smith, Rondell A, MD .  albuterol (PROVENTIL) (2.5 MG/3ML) 0.083% nebulizer solution 2.5 mg, 2.5 mg, Nebulization, Q6H PRN, Smith, Rondell A, MD .  allopurinol (ZYLOPRIM) tablet 300 mg, 300 mg, Oral, Daily, Smith, Rondell A, MD, 300 mg at 11/06/19 1041 .  aspirin EC tablet 81 mg, 81 mg, Oral, Daily, Smith, Rondell A, MD, 81 mg at 11/06/19 1040 .  atorvastatin (LIPITOR) tablet 20 mg, 20 mg, Oral, q1800, Mariel Aloe, MD, 20 mg at 11/06/19 1742 .  baclofen (LIORESAL) tablet 30 mg, 30 mg, Oral, TID WC, Smith, Rondell A, MD, 30 mg at 11/07/19 0759 .  baclofen (LIORESAL)  tablet 40 mg, 40 mg, Oral, QHS, Smith, Rondell A, MD, 40 mg at 11/06/19 2211 .  calcium carbonate (TUMS - dosed in mg elemental calcium) chewable tablet 200 mg of elemental calcium, 1 tablet, Oral, PRN, Hosie Poisson, MD, 200 mg of elemental calcium at 11/05/19 2334 .  calcium citrate (CALCITRATE - dosed in mg elemental calcium) tablet 200 mg of elemental calcium, 200 mg of elemental calcium, Oral, Daily, Smith, Rondell A, MD, 200 mg of elemental calcium at 11/06/19 1044 .  carbidopa-levodopa (SINEMET IR) 10-100 MG per tablet immediate release 1 tablet, 1 tablet, Oral, BID, Fuller Plan A, MD, 1 tablet at 11/07/19 0759 .  Chlorhexidine Gluconate Cloth 2 % PADS 6 each, 6 each, Topical, Daily, Mariel Aloe, MD, 6 each at 11/06/19 1041 .  collagenase (SANTYL) ointment, , Topical, Daily, Norval Morton, MD, Given at 11/06/19 1041 .  fluticasone furoate-vilanterol (BREO ELLIPTA) 200-25 MCG/INH 1 puff, 1 puff, Inhalation, Daily, Tamala Julian, Rondell A, MD, 1 puff at 11/07/19 0845 .  furosemide (LASIX) tablet 40 mg, 40 mg, Oral, Daily, Mariel Aloe, MD, 40 mg at 11/06/19 1038 .  gabapentin (NEURONTIN) capsule 100 mg, 100  mg, Oral, QHS, Smith, Rondell A, MD, 100 mg at 11/06/19 2211 .  guaiFENesin (MUCINEX) 12 hr tablet 1,200 mg, 1,200 mg, Oral, BID, Smith, Rondell A, MD, 1,200 mg at 11/06/19 2211 .  HYDROcodone-acetaminophen (NORCO/VICODIN) 5-325 MG per tablet 1 tablet, 1 tablet, Oral, Q6H PRN, Opyd, Ilene Qua, MD, 1 tablet at 11/07/19 0517 .  ipratropium-albuterol (DUONEB) 0.5-2.5 (3) MG/3ML nebulizer solution 3 mL, 3 mL, Nebulization, Q4H PRN, Smith, Rondell A, MD .  labetalol (NORMODYNE) injection 10 mg, 10 mg, Intravenous, Q2H PRN, Opyd, Ilene Qua, MD .  lactated ringers infusion, , Intravenous, Continuous, Oleta Mouse, MD, Last Rate: 10 mL/hr at 11/07/19 0807, IV Pump Association at 11/07/19 0807 .  latanoprost (XALATAN) 0.005 % ophthalmic solution 1 drop, 1 drop, Both Eyes, QHS, Smith, Rondell  A, MD, 1 drop at 11/06/19 2212 .  levETIRAcetam (KEPPRA) tablet 250 mg, 250 mg, Oral, BID, Smith, Rondell A, MD, 250 mg at 11/06/19 2212 .  levothyroxine (SYNTHROID) tablet 125 mcg, 125 mcg, Oral, QAC breakfast, Opyd, Ilene Qua, MD, 125 mcg at 11/07/19 0517 .  methocarbamol (ROBAXIN) 500 mg in dextrose 5 % 50 mL IVPB, 500 mg, Intravenous, Once, Nettey, Ralph A, MD .  methocarbamol (ROBAXIN) tablet 750 mg, 750 mg, Oral, Q6H PRN, Mariel Aloe, MD, 750 mg at 11/07/19 0428 .  metoprolol succinate (TOPROL-XL) 24 hr tablet 50 mg, 50 mg, Oral, Daily, Mariel Aloe, MD, Stopped at 11/06/19 1235 .  multivitamin with minerals tablet 1 tablet, 1 tablet, Oral, Daily, Tamala Julian, Rondell A, MD, 1 tablet at 11/06/19 1038 .  nitroGLYCERIN (NITROSTAT) SL tablet 0.4 mg, 0.4 mg, Sublingual, Q5 min PRN, Smith, Rondell A, MD .  ondansetron (ZOFRAN) tablet 4 mg, 4 mg, Oral, Q6H PRN **OR** ondansetron (ZOFRAN) injection 4 mg, 4 mg, Intravenous, Q6H PRN, Tamala Julian, Rondell A, MD, 4 mg at 11/04/19 2310 .  pantoprazole (PROTONIX) EC tablet 40 mg, 40 mg, Oral, QPM, Smith, Rondell A, MD, 40 mg at 11/06/19 1742 .  polyethylene glycol (MIRALAX / GLYCOLAX) packet 17 g, 17 g, Oral, Daily, Mariel Aloe, MD, 17 g at 11/06/19 1042 .  sodium chloride flush (NS) 0.9 % injection 3 mL, 3 mL, Intravenous, Q12H, Smith, Rondell A, MD, 3 mL at 11/06/19 2213 .  spironolactone (ALDACTONE) tablet 25 mg, 25 mg, Oral, Daily, Mariel Aloe, MD, 25 mg at 11/06/19 1038 .  SUMAtriptan (IMITREX) tablet 50 mg, 50 mg, Oral, Q2H PRN, Tamala Julian, Rondell A, MD, 50 mg at 11/06/19 1316 .  tamsulosin (FLOMAX) capsule 0.8 mg, 0.8 mg, Oral, QPC supper, Tamala Julian, Rondell A, MD, 0.8 mg at 11/06/19 1742 .  tiZANidine (ZANAFLEX) tablet 2 mg, 2 mg, Oral, TID, Tamala Julian, Rondell A, MD, 2 mg at 11/06/19 2212 .  traZODone (DESYREL) tablet 50 mg, 50 mg, Oral, QHS PRN, Tamala Julian, Rondell A, MD, 50 mg at 10/30/19 2317 .  umeclidinium bromide (INCRUSE ELLIPTA) 62.5 MCG/INH 1 puff, 1  puff, Inhalation, Daily, Mariel Aloe, MD, 1 puff at 11/05/19 0741   Physical Exam: 4+/5 in BUE, BLE with dec'd sensation and strength 3/5 in most groups with occasional spasms in LLE, R DF/PF/EHL 1/5  Assessment & Plan: 79 y.o. man with previously treated cervical myelopathy and resulting spasticity, now with worsening lower extremity function. 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 5/18 s/p L3-4 MIS laminectomy  -no change in neurosurgical plan of care, okay for DVT chemoprophylaxis from my standpoint. I won't be rounding this weekend, one of my partners will be  available 24/7 should any concerns or questions arise. If still inpatient on Monday, will re-evaluate the patient then.   Joyice Faster Dallie Patton  11/07/19 10:03 AM

## 2019-11-07 NOTE — Progress Notes (Signed)
PROGRESS NOTE    ALECXANDER [name]  [id number] DOB: 1940-12-31 DOA: 10/28/2019 PCP: [name], MD    No chief complaint on file.   Brief Narrative:  [name] is a 79 y.o. 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 Patient presented from inpatient rehab for worsening LE weakness and spasticity likely secondary to severe lumbar stenosis. Neurosurgery on board.  MRI showed severe spinal stenosis at the level of L3 and L4 patient underwent laminectomy on 11/04/2019 and PT, OT evaluation recommending CIR.  CIR consult in place and TOC aware. Patient seen and examined at bedside, reports he had some muscle spasms in the back and in the lower extremities overnight and did not have a good night sleep. 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 S/p surgery with laminectomy on 11/04/2019 Resume Robaxin, baclofen.,  Pain control and ambulate as tolerated.  Will add DVT prophylaxis as per neurosurgery recommendations. PT OT recommending CIR. CIR on board currently waiting for insurance authorization and bed availability.   Acute on chronic respiratory failure with hypoxia complicated by left hemidiaphragm paralysis. Patient on 2 L of nasal cannula as outpatient. Continue with incentive  spirometry and CPAP at night.   Ascending aortic aneurysm CT angiogram of the chest showed a 4.2 cm ascending thoracic aortic aneurysm Recommend outpatient follow-up with PCP and annual CTA. Continue with simvastatin daily.  Essential hypertension Blood pressure parameters are well controlled.   Hyperlipidemia Continue with simvastatin   History of chronic diastolic heart failure. Appears to be compensated continue with Lasix 40 mg daily and spironolactone milligrams daily.   Restless leg syndrome Continue with home medications.   Hypothyroidism Continue with Synthroid 125 MCG daily.   Obstructive sleep apnea Continue CPAP at bedtime.   Constipation Resolved.   Myoclonic jerking Continue with Keppra.  Body mass index is 32.73 kg/m. Outpatient follow-up with PCP  BPH continue with Flomax   Leukocytosis Improving Appears to be reactive.   DVT prophylaxis: Heparin Code Status: Full code Family Communication: None at bedside Disposition:   Status is: Inpatient  Remains inpatient appropriate because:Inpatient level of care appropriate due to severity of illness   Dispo: The patient is from: CIR              Anticipated d/c is to: CIR              Anticipated d/c date is: 1 day              Patient currently is medically stable to d/c.        Consultants:   Neurosurgery   Procedures:5/18 s/p L3-4 MIS laminectomy   Antimicrobials:  Anti-infectives (From admission, onward)   Start     Dose/Rate Route Frequency Ordered Stop   11/04/19 1342  bacitracin 50,000 Units in sodium chloride 0.9 % 500 mL irrigation  Status:  Discontinued  As needed 11/04/19 1342 11/04/19 1459   11/04/19 1215  ceFAZolin (ANCEF) IVPB 2g/100 mL premix     2 g 200 mL/hr over 30 Minutes Intravenous  Once 11/04/19 1209 11/04/19 1326   11/04/19 1211  ceFAZolin (ANCEF) 2-4 GM/100ML-% IVPB    Note to Pharmacy: Henrine Screws   : cabinet override      11/04/19 1211  11/04/19 1339   10/29/19 0100  vancomycin (VANCOREADY) IVPB 1250 mg/250 mL  Status:  Discontinued     1,250 mg 166.7 mL/hr over 90 Minutes Intravenous Every 12 hours 10/28/19 2036 10/30/19 1228   10/28/19 2300  ceFEPIme (MAXIPIME) 2 g in sodium chloride 0.9 % 100 mL IVPB  Status:  Discontinued     2 g 200 mL/hr over 30 Minutes Intravenous Every 8 hours 10/28/19 2036 10/30/19 1228         Subjective: Patient reports some muscle spasms during the night.  He denies any chest pain or shortness of breath and pain is better controlled today.  Objective: Vitals:   11/07/19 0500 11/07/19 0736 11/07/19 0845 11/07/19 1213  BP:  119/80    Pulse:  62  87  Resp:  16    Temp:  97.6 F (36.4 C)    TempSrc:      SpO2:  99% 95%   Weight: 94.8 kg     Height:        Intake/Output Summary (Last 24 hours) at 11/07/2019 1359 Last data filed at 11/07/2019 1300 Gross per 24 hour  Intake 538.25 ml  Output 2050 ml  Net -1511.75 ml   Filed Weights   11/04/19 0500 11/05/19 0500 11/07/19 0500  Weight: 100.5 kg 99.8 kg 94.8 kg    Examination:  General exam: Alert and comfortable, not in any kind of distress on nasal cannula oxygen. Respiratory system: Diminished at bases, no wheezing or rhonchi, no tachypnea present Cardiovascular system: S1-S2 heard, regular rate rhythm, no JVD,  Gastrointestinal system: Abdomen is soft, nontender, nondistended, bowel sounds normal Central nervous system: And oriented and answering all questions appropriately Extremities: No cyanosis or clubbing Skin: No rashes seen Psychiatry: Mood is appropriate   Data Reviewed: I have personally reviewed following labs and imaging studies  CBC: Recent Labs  Lab 11/01/19 0522 11/04/19 0239 11/05/19 0243  WBC 10.6* 18.5* 13.9*  NEUTROABS  --   --  12.1*  HGB 12.6* 12.2* 12.3*  HCT 39.9 39.1 39.0  MCV 89.5 91.1 90.5  PLT 254 210 0000000    Basic Metabolic Panel: Recent Labs  Lab 11/01/19 0522 11/04/19 0239  11/05/19 0243  NA 142 141 142  K 4.0 4.0 4.5  CL 101 98 100  CO2 34* 34* 29  GLUCOSE 122* 105* 150*  BUN 20 21 22   CREATININE 0.94 0.89 0.79  CALCIUM 8.9 9.0 9.2    GFR: Estimated Creatinine Clearance: 83.5 mL/min (by C-G formula based on SCr of 0.79 mg/dL).  Liver Function Tests: Recent Labs  Lab 11/05/19 0243  AST 18  ALT 13  ALKPHOS 75  BILITOT 0.4  PROT 6.8  ALBUMIN 3.5    CBG: Recent Labs  Lab 11/06/19 0750 11/06/19 1128 11/06/19 2150 11/07/19 0735 11/07/19 1131  GLUCAP 82 88 147* 87 165*     No results found for this or any previous visit (from the past 240 hour(s)).       Radiology Studies: No results found.      Scheduled Meds: . allopurinol  300 mg Oral Daily  .  aspirin EC  81 mg Oral Daily  . atorvastatin  20 mg Oral q1800  . baclofen  30 mg Oral TID WC  . baclofen  40 mg Oral QHS  . calcium citrate  200 mg of elemental calcium Oral Daily  . carbidopa-levodopa  1 tablet Oral BID  . Chlorhexidine Gluconate Cloth  6 each Topical Daily  . collagenase   Topical Daily  . fluticasone furoate-vilanterol  1 puff Inhalation Daily  . furosemide  40 mg Oral Daily  . gabapentin  100 mg Oral QHS  . guaiFENesin  1,200 mg Oral BID  . heparin injection (subcutaneous)  5,000 Units Subcutaneous Q8H  . latanoprost  1 drop Both Eyes QHS  . levETIRAcetam  250 mg Oral BID  . levothyroxine  125 mcg Oral QAC breakfast  . metoprolol succinate  50 mg Oral Daily  . multivitamin with minerals  1 tablet Oral Daily  . pantoprazole  40 mg Oral QPM  . polyethylene glycol  17 g Oral Daily  . sodium chloride flush  3 mL Intravenous Q12H  . spironolactone  25 mg Oral Daily  . tamsulosin  0.8 mg Oral QPC supper  . tiZANidine  2 mg Oral TID  . umeclidinium bromide  1 puff Inhalation Daily   Continuous Infusions: . lactated ringers 10 mL/hr at 11/07/19 0807  . methocarbamol (ROBAXIN) IV       LOS: 10 days        Hosie Poisson, MD [facility]   To contact the attending provider between 7A-7P or the covering provider during after hours 7P-7A, please log into the web site [email or web address] and access using universal  password for that web site. If you do not have the password, please call the hospital operator.  11/07/2019, 1:59 PM

## 2019-11-07 NOTE — Progress Notes (Signed)
Physical Therapy Treatment Patient Details Name: [name] MRN: [id number] DOB: [birth year] Today's Date: 11/07/2019    History of Present Illness [name] is a 79 y.o. 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 Patient presented from inpatient rehab for worsening LE weakness and spasticity likely secondary to severe lumbar stenosis.  Pt now s/p MIS laminectomy L3-4 on 11/04/19.  Per neurosurgeon note no activity restrictions.    PT Comments    Pt demonstrating good progress today.  He demonstrated improved tolerance for sitting EOB and better balance.  Pt was able to perform lateral scoot to recliner with max A x 2 and cues for sequencing - pt able to maintain his balance with transfer.  Significant improvement from having to use hoyer lift. Pt required skilled cues for transfer sequence, technique, and facilitation.  Pt motivated to participate and improve mobility.    Follow Up Recommendations  CIR     Equipment Recommendations  None recommended by PT    Recommendations for Other Services       Precautions / Restrictions Precautions Precautions: Fall;Back Precaution Comments: L lateral ankle ulcer, L buttock/sacral ulcer Restrictions Weight Bearing Restrictions: No    Mobility  Bed Mobility Overal bed mobility: Needs Assistance Bed Mobility: Rolling;Sidelying to Sit Rolling: +2 for safety/equipment;Max assist Sidelying to sit: +2 for physical assistance;Max assist       General bed mobility comments: able to initiate reaching for rails and attempted to push and assist with bringing trunk upright.  Transfers Overall transfer level: Needs assistance   Transfers: Lateral/Scoot Transfers           Lateral/Scoot Transfers: Max assist;+2 physical assistance;From elevated surface General transfer comment: seated eob with intermittent Bil UE support on bed rail and mattress; Able to push through Bil UEs and aide in initiation of side scooting to the R to recliner;  once in recliner able to utilize arm rests and scoot each hip back with lateral leans.  Required cues for sequencing and technique  Ambulation/Gait                 Stairs             Wheelchair Mobility    Modified Rankin (Stroke Patients Only)       Balance Overall balance assessment: Needs assistance Sitting-balance support: Feet supported;Single extremity supported;Bilateral upper extremity supported;No upper extremity supported Sitting balance-Leahy Scale: Fair Sitting balance - Comments: Pt sat seated at EOB or during transfers for at least 8 mins.  Intially, required min A but able to progress to supervision with Bil UE support.  Did tolerate 1 min sitting with both hands in lap and challenges within BOS.                                    Cognition Arousal/Alertness: Awake/alert Behavior During Therapy: WFL for tasks assessed/performed Overall Cognitive Status: Within Functional Limits for tasks assessed                                 General Comments: pt directs his care very well (aware of precautions, movements that cause  spascity, hospital environment); pleasant      Exercises      General Comments General comments (skin integrity, edema, etc.): Pt positioned in recliner with 2 pillows under buttock, 2 behind back, and 1 under L arm.  Hoyer pad in place.  Pt aware to call nursing when ready to go back to bed.      Pertinent Vitals/Pain Pain Assessment: Faces Faces Pain Scale: Hurts little more Pain Location: L lateral ankle, L buttocks/sacrum, spasms with movement Pain Descriptors / Indicators: Grimacing;Spasm;Discomfort;Moaning Pain  Intervention(s): Limited activity within patient's tolerance;Monitored during session;Repositioned    Home Living                      Prior Function            PT Goals (current goals can now be found in the care plan section) Progress towards PT goals: Progressing toward goals    Frequency    Min 3X/week      PT Plan Current plan remains appropriate    Co-evaluation PT/OT/SLP Co-Evaluation/Treatment: Yes Reason for Co-Treatment: Complexity of the patient's impairments (multi-system involvement);For patient/therapist safety PT goals addressed during session: Mobility/safety with mobility;Balance OT goals addressed during session: ADL's and self-care;Proper use of Adaptive equipment and DME  Pt required skilled PT and OT to address proper transfer technique, balance, and ADLs during treatment.       AM-PAC PT "6 Clicks" Mobility   Outcome Measure  Help needed turning from your back to your side while in a flat bed without using bedrails?: A Lot Help needed moving from lying on your back to sitting on the side of a flat bed without using bedrails?: A Lot Help needed moving to and from a bed to a chair (including a wheelchair)?: Total Help needed standing up from a chair using your arms (e.g., wheelchair or bedside chair)?: Total Help needed to walk in hospital room?: Total Help needed climbing 3-5 steps with a railing? : Total 6 Click Score: 8    End of Session Equipment Utilized During Treatment: Gait belt Activity Tolerance: Patient tolerated treatment well Patient left: with call bell/phone within reach;with family/visitor present;in chair Nurse Communication: Mobility status;Need for lift equipment PT Visit Diagnosis: Other abnormalities of gait and mobility (R26.89);Muscle weakness (generalized) (M62.81);Other symptoms and signs involving the nervous system (R29.898)     Time: [id number] PT Time Calculation (min) (ACUTE ONLY): 31 min  Charges:   $Therapeutic Activity: 8-22 mins                     Maggie Font, PT Acute Rehab Services Pager [phone] [facility] [phone] [facility] [phone]    Karlton Lemon 11/07/2019, 11:53 AM

## 2019-11-07 NOTE — Progress Notes (Signed)
Occupational Therapy Treatment Patient Details Name: [name] MRN: [id number] DOB: 10/23/1940 Today's Date: 11/07/2019    History of present illness [name] is a 79 y.o. 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 Patient presented from inpatient rehab for worsening LE weakness and spasticity likely secondary to severe lumbar stenosis.  Pt now s/p MIS laminectomy L3-4 on 11/04/19.  Per neurosurgeon note no activity restrictions.   OT comments  Pt. Seen with PT for skilled therapy session. Focus of session was bed mobility and transfer to recliner.  able to maintain un supported sitting eob with integration of functional use of B ues for approx. 1 min. before slight LOB.  Pt. able to self correct with use of bed rail for ue support L hand.  Focused on pt. Assisting with use of B UEs to aide in bed mobility and scooting. Pt. Max a x2 for bed mobility and transfer. Pt. Active in session asking about sequencing of the transfer and wanting to know how to assist and what his role would be.  Pt. Very motivated and eager to progress.  Remains excellent candidate for continued intense level therapies in CIR setting.      Follow Up Recommendations  CIR;Supervision/Assistance - 24 hour    Equipment Recommendations  None recommended by OT    Recommendations for Other Services Rehab consult    Precautions / Restrictions Precautions Precautions: Fall;Back Precaution Comments: L lateral ankle ulcer, L buttock/sacral ulcer Restrictions Weight Bearing Restrictions: No       Mobility Bed Mobility Overal bed mobility: Needs Assistance Bed Mobility: Rolling;Sidelying to Sit Rolling: +2 for safety/equipment;Max  assist Sidelying to sit: +2 for physical assistance;Max assist       General bed mobility comments: able to initiate reaching for rails and attempted to push and assist with bringing trunk upright.  Transfers Overall transfer level: Needs assistance   Transfers: Lateral/Scoot Transfers          Lateral/Scoot Transfers: Max assist;+2 physical assistance;From elevated surface General transfer comment: seated eob with intermittent bue support on bed rail and mattress.  able to push through b ues and aide in initiation of side scooting to the R to recliner.  once in recliner able to utilize arm rests and scoot each hip back with lateral leans.    Balance Overall balance assessment: Needs assistance Sitting-balance support: Feet supported;Single extremity supported;Bilateral upper extremity supported Sitting balance-Leahy Scale: Fair Sitting balance - Comments: able to sit unsupported sitting with hands on lap approx. 1 min. including using each ue in functional way to challenge balance (reaching [place] for beard and top of head, L ue to top of head)                                   ADL either performed or assessed with clinical judgement   ADL Overall ADL's : Needs assistance/impaired     Grooming: Wash/dry face;Brushing hair Grooming Details (indicate cue type and reason): simulated with pt. reaching to beard with R hand with no lob noted, also bringing R hand to top of head.  alternated and ws able to bring L hand to top of head before slight lob and able  to self correct with ue support on bed rail with L hand and bed mattress with R hand.                               General ADL Comments: pt. able to simulate beginning movements for grooming tasks while seated eob.     Vision       Perception     Praxis      Cognition Arousal/Alertness: Awake/alert Behavior During Therapy: WFL for tasks assessed/performed Overall Cognitive Status: Within  Functional Limits for tasks assessed                                 General Comments: pt directs his care very well (aware of precautions, movements that cause spascity, hospital environment); pleasant        Exercises     Shoulder Instructions       General Comments      Pertinent Vitals/ Pain       Pain Assessment: Faces Faces Pain Scale: Hurts little more Pain Location: L lateral ankle, L buttocks/sacrum, spasms with movement Pain Descriptors / Indicators: Grimacing;Spasm;Discomfort;Moaning Pain Intervention(s): Limited activity within patient's tolerance;Monitored during session;Repositioned  Home Living                                          Prior Functioning/Environment              Frequency  Min 2X/week        Progress Toward Goals  OT Goals(current goals can now be found in the care plan section)  Progress towards OT goals: Progressing toward goals     Plan Discharge plan remains appropriate;Frequency remains appropriate    Co-evaluation    PT/OT/SLP Co-Evaluation/Treatment: Yes Reason for Co-Treatment: For patient/therapist safety;To address functional/ADL transfers PT goals addressed during session: Mobility/safety with mobility;Balance OT goals addressed during session: Proper use of Adaptive equipment and DME;ADL's and self-care      AM-PAC OT "6 Clicks" Daily Activity     Outcome Measure   Help from another person eating meals?: A Little Help from another person taking care of personal grooming?: A Little Help from another person toileting, which includes using toliet, bedpan, or urinal?: Total Help from another person bathing (including washing, rinsing, drying)?: Total Help from another person to put on and taking off regular upper body clothing?: A Lot Help from another person to put on and taking off regular lower body clothing?: Total 6 Click Score: 11    End of Session Equipment Utilized During  Treatment: Oxygen;Other (comment)  OT Visit Diagnosis: Muscle weakness (generalized) (M62.81);Pain Pain - Right/Left: Left Pain - part of body: Ankle and joints of foot   Activity Tolerance Patient tolerated treatment well   Patient Left in chair;with call bell/phone within reach;with family/visitor present;Other (comment)(hoyer pad under pt. for return to bed)   Nurse Communication          Time: [phone] OT Time Calculation (min): 31 min  Charges: OT General Charges $OT Visit: 1 Visit OT Treatments $Therapeutic Activity: 8-22 mins  Sonia Baller, COTA/L Acute Rehabilitation [phone]   Janice Coffin 11/07/2019, 10:51 AM

## 2019-11-07 NOTE — PMR Pre-admission (Signed)
PMR Admission Coordinator Pre-Admission Assessment  Patient: [name] is an 79 y.o., male MRN: [id number] DOB: 03/31/1941 Height: _0  (170.2 cm) Weight: 94.8 kg  Insurance Information HMO:     PPO: yes     PCP:      IPA:      80/20:      OTHER:  PRIMARY: U[employer] Medicare      Policy#: [id number]      Subscriber: patient CM Name: Bubba Hales       Phone#: [phone]     Fax#: [phone] Pre-Cert#: [id number]      Employer:  [employer]m Kaufmann provided by Bubba Hales for admit to CIR. Pt is approved for 8 days with start date 11/10/19 and next review date 5/31. Clinical updates are to be faxed to [phone] Benefits:  Phone #: [phone]     Name: [email or web address] Eff. Date: 06/20/19-06/18/20     Deduct: does not have ($0)      Out of Pocket Max: $3,900 ($2,526.20 met)      Life Max: NA CIR: $345/day co-pay for days 1-5, $0/day co-pay for days 6+      SNF: $0/day co-pay for days 1-20, $184/day co-pay for days 21-42, $0/day co-pay for days 43-100; limited to 100 days/cal yr Outpatient: $35/visit co-pay; limited by medical necessity     Home Health: 100% coverage, 0% co-insurance; limited by medical necessity       DME: 80% coverage, 20% co-insurance    Providers:  SECONDARY: None      Policy#:      Phone#:   Development worker, community:       Phone#:   The Therapist, art Information Summary" for patients in Inpatient Rehabilitation Facilities with attached "Privacy Act [place] Records" was provided and verbally reviewed with: Patient  Emergency Contact Information Contact Information    Name Relation Home Work Mobile   Resendes,Susan Spouse [phone]  [phone]   Alvino Blood Daughter [phone]     Gladstone, Rosas [phone]        Current Medical History  Patient Admitting Diagnosis: Incomplete paraplegia complicated by acute on chronic respiratory failure with hypoxia.   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 Brett Fairy of neurology services as well as neurosurgery, bowel bladder incontinence.  Per chart review lives with spouse 1 level home with ramped entrance.  Needed assistance for dressing as well as transfers.  He has a PCA who does provide assistance.  He was able to ambulate short household distances with a walker.  Presented 09/17/2019 with generalized fatigue over the last couple of days and decrease in functional mobility.  Cranial CT scan was negative for acute changes.  Admission chemistries BUN 43, creatinine 1.64, WBC 13,000, troponin negative, urinalysis negative nitrite with urine culture 30,000 diphtheroids, blood cultures no growth to date.  MRI thoracic lumbar cervical spine showed chronic changes with patchy cord signal abnormality at the C3-4 level consistent with chronic myelomalacia.  Multilevel degenerative disc disease of the lower thoracic spine without high-grade spinal stenosis as well as lumbar spondylosis and congenitally short pedicles causing prominent impingement at  L2-3 and 3-4 with moderate impingement L1-2 and 4-5 as well as T12-L1 and L5-S1.  Impingement of L1-2 and 2-3 minimally worsened from prior exam 07/24/2017.  Neurosurgery Dr. Kristeen Miss consulted in regards to MRI findings patient not felt to be a surgical candidate advised IV Solu-Medrol then transition to prolonged prednisone with taper.  Echocardiogram with ejection fraction 60% no wall motion abnormalities.  Chronic pain control ongoing with the use of scheduled baclofen titrated to 15 mg 3 times  daily as well as nightly Neurontin and OxyContin 10 mg every 12 hours.  Therapy evaluations completed and patient was admitted to inpatient rehab services 09/26/2019 with slow overall progress.  Ongoing care of patient's spasticity with follow-up recommendations followed by neurology as well as neurosurgery and considerations were even made for a possible baclofen pump.  Follow-up cervical lumbar thoracic myelogram completed showing severe degenerative with mild scoliosis and listhesis as well as L3-4 severe spinal stenosis L2-3 moderate to advanced spinal stenosis L1-2 moderate spinal stenosis.  Resolved cord compression after ACDF.  There was some bilateral foraminal narrowing noted.  Thoracic spine diffuse degenerative disease without cord compression.  Neurosurgery again discussed at length with family in regards to his weakness spasticity myelogram findings recommendations for L3-4 decompression.  During patient's rehabilitation stay he did remain on Lovenox for DVT prophylaxis OSA with CPAP chronic oxygen monitoring followed by pulmonary services.  Wound care consulted 10/23/2019 for left ankle ulcer as well as left buttock with wound care as directed.  Left ankle films were completed showing no evidence of osteomyelitis fracture or other acute abnormality no visible abscess.  Patient was transferred to acute care services 10/28/2019 and underwent lumbar 3-4 laminectomy decompression 11/04/2019 per Dr. Venetia Constable.  Palliative care was consulted to establish goals of care.  He was placed on subcutaneous heparin for DVT prophylaxis.  CPAP ongoing as well as oxygen needs for history of OSA/chronic respiratory failure.  Wound care ongoing for left buttock sacral wound and left lateral ankle wound.  Therapy evaluations completed and patient is to be admitted for a comprehensive rehab program on 11/10/19.    Patient's medical record from [facility] has been reviewed by the rehabilitation admission  coordinator and physician.  Past Medical History  Past Medical History:  Diagnosis Date  . Arthritis   . Barrett esophagus   . Bronchitis   . Carotid artery occlusion   . COPD (chronic obstructive pulmonary disease) ([place])   . Diabetes mellitus without complication ([place])   . GERD (gastroesophageal reflux disease)   . Headache    migraines  . History of thyroid cancer 2008  . Hypertension   . Restless leg   . Sleep apnea with use of continuous positive airway pressure (CPAP)    2011 [facility] , [facility]  77cn central and obstrcutive. 16 cm water , 3 cm EPR.   . Thoracic ascending aortic aneurysm (HCC)    4.2 cm ascending TAA 08/18/17 CTA. Annual imaging recommended.  . thyroid ca dx'd 2009 or 2010   surg and radioactive isoptope  . Ulcer    Peptic ulcer disease    Family History   family history includes COPD in his mother; Cancer in his father; Diabetes in his father; Hyperlipidemia in his father and mother; Hypertension in his father and mother; Kidney disease in his father.  Prior Rehab/Hospitalizations Has the patient had prior rehab or hospitalizations prior to admission? Yes  Has the patient had major surgery during 100 days prior to  admission? Yes   Current Medications  Current Facility-Administered Medications:  .  acetaminophen (TYLENOL) tablet 650 mg, 650 mg, Oral, Q6H PRN, Smith, Rondell A, MD .  albuterol (PROVENTIL) (2.5 MG/3ML) 0.083% nebulizer solution 2.5 mg, 2.5 mg, Nebulization, Q6H PRN, Smith, Rondell A, MD .  allopurinol (ZYLOPRIM) tablet 300 mg, 300 mg, Oral, Daily, Smith, Rondell A, MD, 300 mg at 11/10/19 1052 .  aspirin EC tablet 81 mg, 81 mg, Oral, Daily, Smith, Rondell A, MD, 81 mg at 11/10/19 1052 .  atorvastatin (LIPITOR) tablet 20 mg, 20 mg, Oral, q1800, Mariel Aloe, MD, 20 mg at 11/09/19 1714 .  baclofen (LIORESAL) tablet 30 mg, 30 mg, Oral, TID WC, Smith, Rondell A, MD, 30 mg at 11/10/19 1535 .  baclofen (LIORESAL) tablet 40 mg, 40 mg, Oral,  QHS, Smith, Rondell A, MD, 40 mg at 11/09/19 2303 .  calcium carbonate (TUMS - dosed in mg elemental calcium) chewable tablet 200 mg of elemental calcium, 1 tablet, Oral, PRN, Hosie Poisson, MD, 200 mg of elemental calcium at 11/05/19 2334 .  calcium citrate (CALCITRATE - dosed in mg elemental calcium) tablet 200 mg of elemental calcium, 200 mg of elemental calcium, Oral, Daily, Smith, Rondell A, MD, 200 mg of elemental calcium at 11/10/19 1049 .  carbidopa-levodopa (SINEMET IR) 10-100 MG per tablet immediate release 1 tablet, 1 tablet, Oral, BID, Tamala Julian, Rondell A, MD, 1 tablet at 11/10/19 1539 .  Chlorhexidine Gluconate Cloth 2 % PADS 6 each, 6 each, Topical, Daily, Mariel Aloe, MD, 6 each at 11/10/19 1054 .  collagenase (SANTYL) ointment, , Topical, Daily, Norval Morton, MD, Given at 11/10/19 1054 .  docusate sodium (COLACE) capsule 100 mg, 100 mg, Oral, BID, Hosie Poisson, MD, 100 mg at 11/10/19 1050 .  fluticasone furoate-vilanterol (BREO ELLIPTA) 200-25 MCG/INH 1 puff, 1 puff, Inhalation, Daily, Fuller Plan A, MD, 1 puff at 11/10/19 0953 .  furosemide (LASIX) tablet 40 mg, 40 mg, Oral, Daily, Mariel Aloe, MD, 40 mg at 11/10/19 1054 .  gabapentin (NEURONTIN) capsule 100 mg, 100 mg, Oral, QHS, Smith, Rondell A, MD, 100 mg at 11/09/19 2257 .  guaiFENesin (MUCINEX) 12 hr tablet 1,200 mg, 1,200 mg, Oral, BID, Smith, Rondell A, MD, 1,200 mg at 11/10/19 1052 .  heparin injection 5,000 Units, 5,000 Units, Subcutaneous, Q8H, Hosie Poisson, MD, 5,000 Units at 11/10/19 1541 .  HYDROcodone-acetaminophen (NORCO/VICODIN) 5-325 MG per tablet 1 tablet, 1 tablet, Oral, Q3H PRN, Hosie Poisson, MD, 1 tablet at 11/10/19 1534 .  ipratropium-albuterol (DUONEB) 0.5-2.5 (3) MG/3ML nebulizer solution 3 mL, 3 mL, Nebulization, Q4H PRN, Smith, Rondell A, MD .  labetalol (NORMODYNE) injection 10 mg, 10 mg, Intravenous, Q2H PRN, Opyd, Ilene Qua, MD .  lactated ringers infusion, , Intravenous, Continuous, Oleta Mouse, MD, Last Rate: 10 mL/hr at 11/07/19 0807, IV Pump Association at 11/07/19 0807 .  latanoprost (XALATAN) 0.005 % ophthalmic solution 1 drop, 1 drop, Both Eyes, QHS, Smith, Rondell A, MD, 1 drop at 11/09/19 2252 .  levETIRAcetam (KEPPRA) tablet 250 mg, 250 mg, Oral, BID, Smith, Rondell A, MD, 250 mg at 11/10/19 1050 .  levothyroxine (SYNTHROID) tablet 125 mcg, 125 mcg, Oral, QAC breakfast, Opyd, Ilene Qua, MD, 125 mcg at 11/10/19 0636 .  methocarbamol (ROBAXIN) 500 mg in dextrose 5 % 50 mL IVPB, 500 mg, Intravenous, Once, Mariel Aloe, MD .  methocarbamol (ROBAXIN) tablet 750 mg, 750 mg, Oral, Q6H PRN, Hosie Poisson, MD, 750 mg at 11/07/19 2339 .  metoprolol succinate (  TOPROL-XL) 24 hr tablet 50 mg, 50 mg, Oral, Daily, Mariel Aloe, MD, 50 mg at 11/10/19 1052 .  multivitamin with minerals tablet 1 tablet, 1 tablet, Oral, Daily, Tamala Julian, Rondell A, MD, 1 tablet at 11/10/19 1050 .  nitroGLYCERIN (NITROSTAT) SL tablet 0.4 mg, 0.4 mg, Sublingual, Q5 min PRN, Smith, Rondell A, MD .  ondansetron (ZOFRAN) tablet 4 mg, 4 mg, Oral, Q6H PRN **OR** ondansetron (ZOFRAN) injection 4 mg, 4 mg, Intravenous, Q6H PRN, Tamala Julian, Rondell A, MD, 4 mg at 11/04/19 2310 .  pantoprazole (PROTONIX) EC tablet 40 mg, 40 mg, Oral, QPM, Smith, Rondell A, MD, 40 mg at 11/09/19 1716 .  polyethylene glycol (MIRALAX / GLYCOLAX) packet 17 g, 17 g, Oral, Daily, Mariel Aloe, MD, 17 g at 11/10/19 1055 .  senna-docusate (Senokot-S) tablet 2 tablet, 2 tablet, Oral, BID, Hosie Poisson, MD, 2 tablet at 11/10/19 1053 .  sodium chloride flush (NS) 0.9 % injection 3 mL, 3 mL, Intravenous, Q12H, Smith, Rondell A, MD, 3 mL at 11/10/19 1055 .  spironolactone (ALDACTONE) tablet 25 mg, 25 mg, Oral, Daily, Mariel Aloe, MD, 25 mg at 11/10/19 1054 .  SUMAtriptan (IMITREX) tablet 50 mg, 50 mg, Oral, Q2H PRN, Tamala Julian, Rondell A, MD, 50 mg at 11/06/19 1316 .  tamsulosin (FLOMAX) capsule 0.8 mg, 0.8 mg, Oral, QPC supper, Tamala Julian, Rondell  A, MD, 0.8 mg at 11/09/19 1715 .  tiZANidine (ZANAFLEX) tablet 2 mg, 2 mg, Oral, TID, Smith, Rondell A, MD, 2 mg at 11/10/19 1539 .  traZODone (DESYREL) tablet 50 mg, 50 mg, Oral, QHS PRN, Tamala Julian, Rondell A, MD, 50 mg at 10/30/19 2317 .  umeclidinium bromide (INCRUSE ELLIPTA) 62.5 MCG/INH 1 puff, 1 puff, Inhalation, Daily, Mariel Aloe, MD, 1 puff at 11/10/19 9528  Patients Current Diet:  Diet Order            Diet 2 gram sodium Room service appropriate? Yes; Fluid consistency: Thin  Diet effective now              Precautions / Restrictions Precautions Precautions: Fall, Back Precaution Booklet Issued: No Precaution Comments: L lateral ankle ulcer, L buttock/sacral ulcer Restrictions Weight Bearing Restrictions: No   Has the patient had 2 or more falls or a fall with injury in the past year? Yes  Prior Activity Level Household: not working or driving PTA; Supervision/Min A for ADLs and ambulation prior to hospital admission on 09/18/19  Prior Functional Level Self Care: Did the patient need help bathing, dressing, using the toilet or eating? Needed some help  Indoor Mobility: Did the patient need assistance with walking from room to room (with or without device)? Needed some help  Stairs: Did the patient need assistance with internal or external stairs (with or without device)? Dependent  Functional Cognition: Did the patient need help planning regular tasks such as shopping or remembering to take medications? Needed some help  Home Assistive Devices / Equipment Home Assistive Devices/Equipment: CPAP, Eyeglasses, Grab bars around toilet, Grab bars in shower, [employer], Civil engineer, contracting without back, Wheelchair, Environmental consultant (specify type)(upper lower dentures, front wheel walker, walk-in shower) Home Equipment: Environmental consultant - 2 wheels, Wheelchair - manual, Bedside commode, Shower seat, Grab bars - tub/shower, Other (comment), Hand held shower head, Adaptive equipment  Prior Device Use:  Indicate devices/aids used by the patient prior to current illness, exacerbation or injury? Walker and wc  Current Functional Level Cognition  Arousal/Alertness: Awake/alert Overall Cognitive Status: Within Functional Limits for tasks assessed Orientation Level: Oriented  X4 General Comments: pt directs his care very well (aware of precautions, movements that cause spascity, hospital environment); pleasant Attention: Focused, Sustained Focused Attention: Appears intact Sustained Attention: Appears intact Memory: Appears intact Awareness: Appears intact Problem Solving: Appears intact Safety/Judgment: Appears intact    Extremity Assessment (includes Sensation/Coordination)  Upper Extremity Assessment: Defer to OT evaluation  Lower Extremity Assessment: RLE deficits/detail, LLE deficits/detail RLE Deficits / Details: Intermittent spasming  R greater than L,  Sitting EOB, pt's R LE tested at 3-/5, but on a limited basis.  Resistance to movement/spasm at a 4/5 on the mod [place]. RLE Coordination: decreased fine motor, decreased gross motor LLE Deficits / Details: intermittent spasming, LE spasm able to be inhibited enough to attain full knee extention.  L with less spasm than R LE and strength tested at >=3+/5 LLE Coordination: decreased fine motor    ADLs  Overall ADL's : Needs assistance/impaired Eating/Feeding: Sitting, Total assistance Eating/Feeding Details (indicate cue type and reason): requried total to drink sitting EOB; pt declined holding cup independently d/t feeling "too unsteady" Grooming: Wash/dry face, Brushing hair Grooming Details (indicate cue type and reason): simulated with pt. reaching to beard with R hand with no lob noted, also bringing R hand to top of head.  alternated and ws able to bring L hand to top of head before slight lob and able to self correct with ue support on bed rail with L hand and bed mattress with R hand. Upper Body Bathing: Moderate assistance,  Sitting Lower Body Bathing: Maximal assistance, Sitting/lateral leans Upper Body Dressing : Moderate assistance Lower Body Dressing: Total assistance, Bed level Lower Body Dressing Details (indicate cue type and reason): diaper Toilet Transfer Details (indicate cue type and reason): defer transfer but able to transition EOB with MAX A+2 and intermittent MIN A for sitting balance Toileting- Clothing Manipulation and Hygiene: Total assistance, Bed level Toileting - Clothing Manipulation Details (indicate cue type and reason): pt with bowel incontinence, provided pericare and replaced sacral dressing Functional mobility during ADLs: Maximal assistance, +2 for physical assistance(bed mobility only) General ADL Comments: pt. able to simulate beginning movements for grooming tasks while seated eob.    Mobility  Overal bed mobility: Needs Assistance Bed Mobility: Rolling, Sidelying to Sit Rolling: +2 for safety/equipment, Max assist Sidelying to sit: +2 for physical assistance, Max assist Supine to sit: Max assist, +2 for physical assistance Sit to supine: Total assist, +2 for physical assistance, HOB elevated General bed mobility comments: able to initiate reaching for rails and attempted to push and assist with bringing trunk upright.    Transfers  Overall transfer level: Needs assistance Transfer via Lift Equipment: Maximove Transfers: Lateral/Scoot Transfers  Lateral/Scoot Transfers: Max assist, +2 physical assistance, From elevated surface General transfer comment: seated eob with intermittent Bil UE support on bed rail and mattress; Able to push through Bil UEs and aide in initiation of side scooting to the R to recliner;  once in recliner able to utilize arm rests and scoot each hip back with lateral leans.  Required cues for sequencing and technique    Ambulation / Gait / Stairs / Wheelchair Mobility  Ambulation/Gait General Gait Details: not attempted    Posture / Balance Static  Sitting Balance Static Sitting - Balance Support: Feet supported, Bilateral upper extremity supported Static Sitting - Level of Assistance: 4: Min assist Dynamic Sitting Balance Sitting balance - Comments: Pt sat seated at EOB or during transfers for at least 8 mins.  Intially, required min A but  able to progress to supervision with Bil UE support.  Did tolerate 1 min sitting with both hands in lap and challenges within BOS. Balance Overall balance assessment: Needs assistance Sitting-balance support: Feet supported, Single extremity supported, Bilateral upper extremity supported, No upper extremity supported Sitting balance-Leahy Scale: Fair Sitting balance - Comments: Pt sat seated at EOB or during transfers for at least 8 mins.  Intially, required min A but able to progress to supervision with Bil UE support.  Did tolerate 1 min sitting with both hands in lap and challenges within BOS.    Special needs/care consideration CPAP orders in chart  Oxygen: 5L  Special Bed: may benefit from air mattress due to decreased ability to offload  Skin: abrasion to right knee, blister to left finger, ecchymosis to right,left arm, MASD to left buttocks, skin tear to right and left arm; pressure injury to coccyx (left unstagable), surgical incision to back.    Diabetic management: yes   and Designated visitor: wife [place] (from acute therapy documentation) Living Arrangements: Spouse/significant other  Lives With: Spouse Available Help at Discharge: Family, Available 24 hours/day, Personal care attendant Type of Home: House Home Layout: One level Home Access: Ramped entrance Bathroom Shower/Tub: Multimedia programmer: Handicapped height Bathroom Accessibility: Yes How Accessible: Accessible via wheelchair [place]: Yes Type of [place]: [place] (if known): [place] Additional Comments: PCA assists with bed  mobility and dressing (transfers). Pt d/c from [place] 10/27/19  Discharge Living Setting Plans for Discharge Living Setting: Patient's home, Lives with (comment)(lives with wife; has caregiver 6 hours/day 7 days/week) Type of Home at Discharge: House Discharge Home Layout: One level Discharge Home Access: [place] entrance Discharge Bathroom Shower/Tub: Walk-in shower Discharge Bathroom Toilet: Standard Discharge Bathroom Accessibility: Yes How Accessible: Accessible via wheelchair Does the patient have any problems obtaining your medications?: No  Social/Family/Support Systems Patient Roles: Spouse Contact Information: susan: wife [phone] Anticipated Caregiver: wife + hired caregiver Anticipated Ambulance person Information: see above Ability/Limitations of Caregiver: Min A/Mod A, wife has hoyer lift she can utilize  Careers adviser: 24/7 Discharge Plan Discussed with Primary Caregiver: Yes(pt and his wife) Is Caregiver In Agreement with Plan?: Yes Does Caregiver/Family have Issues with Lodging/Transportation while Pt is in Rehab?: No  Goals Patient/Family Goal for Rehab: PT/OT: Mod A; SLP: NA Expected length of stay: 16-20 days Cultural Considerations: NA Pt/Family Agrees to Admission and willing to participate: Yes Program Orientation Provided & Reviewed with Pt/Caregiver Including Roles  & Responsibilities: Yes(pt and his wife)  Barriers to Discharge: Lack of/limited family support(pt will need to be a +1 assist at times to DC home. )  Decrease burden of Care through IP rehab admission: NA  Possible need for SNF placement upon discharge: Not anticipated; pt has good social support from his wife and access to hired help at [place]. Wife has reasonable goals and already has access to hoyer lift if needed at home.   Patient Condition: I have reviewed medical records from [facility], spoken with MD, and patient and spouse. I met with patient at the bedside  for inpatient rehabilitation assessment.  Patient will benefit from ongoing PT and OT, can actively participate in 3 hours of therapy a day 5 days of the week, and can make measurable gains during the admission.  Patient will also benefit from the coordinated team approach during an Inpatient Acute Rehabilitation admission.  The patient will receive intensive therapy as  well as Rehabilitation physician, nursing, social worker, and care management interventions.  Due to safety, skin/wound care, disease management, medication administration, pain management and patient education the patient requires 24 hour a day rehabilitation nursing.  The patient is currently Max A +2 transfers and total A to Max A +2 for basic ADLs.  Discharge setting and therapy post discharge at home with home health is anticipated.  Patient has agreed to participate in the Acute Inpatient Rehabilitation Program and will admit 11/10/19.  Preadmission Screen Completed By:  Raechel Ache, 11/10/2019 3:42 PM ______________________________________________________________________   Discussed status with Dr. Danae Chen on 11/10/19 at 3:42PM and received approval for admission today.  Admission Coordinator:  Raechel Ache, OT, time 3:42PM/Date 11/10/19   Assessment/Plan: Diagnosis:Cervical myelopathy with spastic paraplegia 1. Does the need for close, 24 hr/day Medical supervision in concert with the patient's rehab needs make it unreasonable for this patient to be served in a less intensive setting? Yes 2. Co-Morbidities requiring supervision/potential complications: Grade 3 sacral decubitus 3. Due to bladder management, bowel management, safety, skin/wound care, disease management, medication administration, pain management and patient education, does the patient require 24 hr/day rehab nursing? Yes 4. Does the patient require coordinated care of a physician, rehab nurse, PT, OT, to address physical and functional deficits in the context of the  above medical diagnosis(es)? Yes Addressing deficits in the following areas: balance, endurance, locomotion, strength, transferring, bowel/bladder control, bathing, dressing, toileting and psychosocial support 5. Can the patient actively participate in an intensive therapy program of at least 3 hrs of therapy 5 days a week? Yes 6. The potential for patient to make measurable gains while on inpatient rehab is good 7. Anticipated functional outcomes upon discharge from inpatient rehab: mod assist PT, mod assist OT, n/a SLP 8. Estimated rehab length of stay to reach the above functional goals is: 16-20d 9. Anticipated discharge destination: Home 10. Overall Rehab/Functional Prognosis: good   MD Signature: Charlett Blake M.D. [facility] FAAPM&R (Neuromuscular Med) Diplomate Am Board of Electrodiagnostic Med Fellow [employer]

## 2019-11-08 LAB — CBC WITH DIFFERENTIAL/PLATELET
Abs Immature Granulocytes: 0.07 10*3/uL (ref 0.00–0.07)
Basophils Absolute: 0.1 10*3/uL (ref 0.0–0.1)
Basophils Relative: 1 %
Eosinophils Absolute: 0.7 10*3/uL — ABNORMAL HIGH (ref 0.0–0.5)
Eosinophils Relative: 6 %
HCT: 36.8 % — ABNORMAL LOW (ref 39.0–52.0)
Hemoglobin: 11.5 g/dL — ABNORMAL LOW (ref 13.0–17.0)
Immature Granulocytes: 1 %
Lymphocytes Relative: 12 %
Lymphs Abs: 1.4 10*3/uL (ref 0.7–4.0)
MCH: 28.3 pg (ref 26.0–34.0)
MCHC: 31.3 g/dL (ref 30.0–36.0)
MCV: 90.4 fL (ref 80.0–100.0)
Monocytes Absolute: 0.8 10*3/uL (ref 0.1–1.0)
Monocytes Relative: 7 %
Neutro Abs: 9 10*3/uL — ABNORMAL HIGH (ref 1.7–7.7)
Neutrophils Relative %: 73 %
Platelets: 215 10*3/uL (ref 150–400)
RBC: 4.07 MIL/uL — ABNORMAL LOW (ref 4.22–5.81)
RDW: 14.8 % (ref 11.5–15.5)
WBC: 12.1 10*3/uL — ABNORMAL HIGH (ref 4.0–10.5)
nRBC: 0 % (ref 0.0–0.2)

## 2019-11-08 LAB — COMPREHENSIVE METABOLIC PANEL
ALT: 16 U/L (ref 0–44)
AST: 17 U/L (ref 15–41)
Albumin: 3.1 g/dL — ABNORMAL LOW (ref 3.5–5.0)
Alkaline Phosphatase: 67 U/L (ref 38–126)
Anion gap: 10 (ref 5–15)
BUN: 24 mg/dL — ABNORMAL HIGH (ref 8–23)
CO2: 33 mmol/L — ABNORMAL HIGH (ref 22–32)
Calcium: 9 mg/dL (ref 8.9–10.3)
Chloride: 98 mmol/L (ref 98–111)
Creatinine, Ser: 0.79 mg/dL (ref 0.61–1.24)
GFR calc Af Amer: >60 mL/min (ref >60)
GFR calc non Af Amer: >60 mL/min (ref >60)
Glucose, Bld: 131 mg/dL — ABNORMAL HIGH (ref 70–99)
Potassium: 4.2 mmol/L (ref 3.5–5.1)
Sodium: 141 mmol/L (ref 135–145)
Total Bilirubin: 0.6 mg/dL (ref 0.3–1.2)
Total Protein: 6.2 g/dL — ABNORMAL LOW (ref 6.5–8.1)

## 2019-11-08 LAB — GLUCOSE, CAPILLARY
Glucose-Capillary: 88 mg/dL (ref 70–99)
Glucose-Capillary: 143 mg/dL — ABNORMAL HIGH (ref 70–99)
Glucose-Capillary: 135 mg/dL — ABNORMAL HIGH (ref 70–99)

## 2019-11-08 MED ORDER — HYDROCODONE-ACETAMINOPHEN 5-325 MG PO TABS
1.0000 | ORAL_TABLET | Freq: Four times a day (QID) | ORAL | Status: DC | PRN
  Administered 2019-11-08 – 2019-11-09 (×2): 1 via ORAL
  Administered 2019-11-09: 2 via ORAL
  Administered 2019-11-09 – 2019-11-10 (×2): 1 via ORAL
  Filled 2019-11-08 (×3): qty 1
  Filled 2019-11-08 (×3): qty 2

## 2019-11-08 NOTE — Progress Notes (Signed)
PROGRESS NOTE    PHUNG BUSSA  [id number] DOB: [birth year] DOA: 10/28/2019 PCP: [name], MD    No chief complaint on file.   Brief Narrative:  [name] is a 79 y.o. 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 Patient presented from inpatient rehab for worsening LE weakness and spasticity likely secondary to severe lumbar stenosis. Neurosurgery on board.  MRI showed severe spinal stenosis at the level of L3 and L4 patient underwent laminectomy on 11/04/2019 and PT, OT evaluation recommending CIR.  CIR consult in place and TOC aware. Patient seen and examined at bedside, reports he had back pain for the first time last night and this morning and wants [place] to increase his pain meds.   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 S/p surgery with laminectomy on 11/04/2019 Resume Robaxin, baclofen.,  Pain control with Vicodin 1 tab every 6 hours.  Patient reports that his pain is not well controlled and requested to increase the pain medication.  He will increase the Vicodin from 1 to 2 tablets every 6 hours as needed.  Recommend ambulate as tolerated.   PT OT recommending CIR. CIR on board currently waiting for insurance authorization and bed availability.   Acute on chronic  respiratory failure with hypoxia complicated by left hemidiaphragm paralysis. Patient on 2 L of nasal cannula as outpatient. Continue with incentive spirometry and CPAP at night.    Ascending aortic aneurysm CT angiogram of the chest showed a 4.2 cm ascending thoracic aortic aneurysm Recommend outpatient follow-up with PCP and annual CTA. Continue with simvastatin daily.  Essential hypertension Blood pressure parameters are well controlled.   Hyperlipidemia Continue with simvastatin   History of chronic diastolic heart failure. Appears to be compensated continue with Lasix 40 mg daily and spironolactone 5 mg daily   Restless leg syndrome Continue with home medications.  No changes in his medications.   Hypothyroidism Continue with Synthroid 125 MCG daily.   Obstructive sleep apnea Continue CPAP at bedtime.   Constipation Resolved.   Myoclonic jerking Continue with Keppra.  Body mass index is 32.73 kg/m. Outpatient follow-up with PCP  BPH continue with Flomax   Leukocytosis Improving Appears to be reactive.   DVT prophylaxis: Heparin Code Status: Full code Family Communication: None at bedside Disposition:   Status is: Inpatient  Remains inpatient appropriate because:Inpatient level of care appropriate due to severity of illness   Dispo: The patient is from: CIR              Anticipated d/c is to: CIR              Anticipated d/c date is: 1 day              Patient currently is medically stable to d/c.        Consultants:   Neurosurgery   Procedures:5/18 s/p L3-4 MIS laminectomy  Antimicrobials:  Anti-infectives (From admission, onward)   Start     Dose/Rate Route Frequency Ordered Stop   11/04/19 1342  bacitracin 50,000 Units in sodium chloride 0.9 % 500 mL irrigation  Status:  Discontinued       As needed 11/04/19 1342 11/04/19 1459   11/04/19 1215  ceFAZolin (ANCEF) IVPB 2g/100 mL premix     2 g 200 mL/hr over 30 Minutes  Intravenous  Once 11/04/19 1209 11/04/19 1326   11/04/19 1211  ceFAZolin (ANCEF) 2-4 GM/100ML-% IVPB    Note to Pharmacy: Henrine Screws   : cabinet override      11/04/19 1211 11/04/19 1339   10/29/19 0100  vancomycin (VANCOREADY) IVPB 1250 mg/250 mL  Status:  Discontinued     1,250 mg 166.7 mL/hr over 90 Minutes Intravenous Every 12 hours 10/28/19 2036 10/30/19 1228   10/28/19 2300  ceFEPIme (MAXIPIME) 2 g in sodium chloride 0.9 % 100 mL IVPB  Status:  Discontinued     2 g 200 mL/hr over 30 Minutes Intravenous Every 8 hours 10/28/19 2036 10/30/19 1228         Subjective: Patient reports he has back pain first time this morning and last night and and wondered if we can go home his pain medications.  Denies any nausea vomiting abdominal pain or chest pain or shortness of breath at this time. Objective: Vitals:   11/07/19 2000 11/07/19 2319 11/08/19 0520 11/08/19 0814  BP: 123/74  114/76 (!) 109/98  Pulse: 70 68 73 72  Resp: 20 18 18 18   Temp: 98.1 F (36.7 C)  97.7 F (36.5 C) 98 F (36.7 C)  TempSrc: Axillary  Axillary   SpO2: 94% 98% 94% 99%  Weight:      Height:        Intake/Output Summary (Last 24 hours) at 11/08/2019 1516 Last data filed at 11/08/2019 [id number] Gross per 24 hour  Intake 120 ml  Output 1200 ml  Net -1080 ml   Filed Weights   11/04/19 0500 11/05/19 0500 11/07/19 0500  Weight: 100.5 kg 99.8 kg 94.8 kg    Examination:  General exam: Alert and comfortable, not in any kind of distress at this time. Respiratory system: Air entry fair bilateral no wheezing or rhonchi no tachypnea. Cardiovascular system: S1-S2 heard, regular rate rhythm, no JVD Gastrointestinal system: Abdomen is soft, nontender, nondistended with normal bowel sounds Central nervous system: Alert oriented to person stable answering all questions appropriately Extremities: No cyanosis Skin: No rashes seen Psychiatry: Mood is appropriate  Data Reviewed: I have personally reviewed following  labs and imaging studies  CBC: Recent Labs  Lab 11/04/19 0239 11/05/19 0243  WBC 18.5* 13.9*  NEUTROABS  --  12.1*  HGB 12.2* 12.3*  HCT 39.1 39.0  MCV 91.1 90.5  PLT 210 0000000    Basic Metabolic Panel: Recent Labs  Lab 11/04/19 0239 11/05/19 0243  NA 141 142  K 4.0 4.5  CL 98 100  CO2 34* 29  GLUCOSE 105* 150*  BUN 21 22  CREATININE 0.89 0.79  CALCIUM 9.0 9.2    GFR: Estimated Creatinine Clearance: 83.5 mL/min (by C-G formula based on SCr of 0.79 mg/dL).  Liver Function Tests: Recent Labs  Lab 11/05/19 0243  AST 18  ALT 13  ALKPHOS 75  BILITOT 0.4  PROT 6.8  ALBUMIN 3.5    CBG: Recent Labs  Lab 11/07/19 1131 11/07/19 1629 11/07/19 2107 11/08/19 0814 11/08/19 1154  GLUCAP 165* 117* 155* 88 143*  No results found for this or any previous visit (from the past 240 hour(s)).       Radiology Studies: No results found.      Scheduled Meds: . allopurinol  300 mg Oral Daily  . aspirin EC  81 mg Oral Daily  . atorvastatin  20 mg Oral q1800  . baclofen  30 mg Oral TID WC  . baclofen  40 mg Oral QHS  . calcium citrate  200 mg of elemental calcium Oral Daily  . carbidopa-levodopa  1 tablet Oral BID  . Chlorhexidine Gluconate Cloth  6 each Topical Daily  . collagenase   Topical Daily  . fluticasone furoate-vilanterol  1 puff Inhalation Daily  . furosemide  40 mg Oral Daily  . gabapentin  100 mg Oral QHS  . guaiFENesin  1,200 mg Oral BID  . heparin injection (subcutaneous)  5,000 Units Subcutaneous Q8H  . latanoprost  1 drop Both Eyes QHS  . levETIRAcetam  250 mg Oral BID  . levothyroxine  125 mcg Oral QAC breakfast  . metoprolol succinate  50 mg Oral Daily  . multivitamin with minerals  1 tablet Oral Daily  . pantoprazole  40 mg Oral QPM  . polyethylene glycol  17 g Oral Daily  . sodium chloride flush  3 mL Intravenous Q12H  . spironolactone  25 mg Oral Daily  . tamsulosin  0.8 mg Oral QPC supper  . tiZANidine  2 mg Oral TID  .  umeclidinium bromide  1 puff Inhalation Daily   Continuous Infusions: . lactated ringers 10 mL/hr at 11/07/19 0807  . methocarbamol (ROBAXIN) IV       LOS: 11 days        Hosie Poisson, MD [facility]   To contact the attending provider between 7A-7P or the covering provider during after hours 7P-7A, please log into the web site [email or web address] and access using universal [place] password for that web site. If you do not have the password, please call the hospital operator.  11/08/2019, 3:16 PM

## 2019-11-08 NOTE — Progress Notes (Signed)
Pt requesting to speak to charge nurse regarding medications. This RN entered room to speak with patient.  Patient requesting printed copy of medical record to review which medications were adminstered overnight and this morning. This RN explained that following discharge he could request a copy from medical records. Patient refused this option. Patient's MAR was pulled up on the computer and reviewed with patient. Patient angry that he feels very sleepy and also angry that he cannot receive pain medication at this time. Pain medication given at [phone] and not available at this time. This RN explained the side effects of his prescribed and adminstered muscle relaxers and pain medications, as far as making him drowsy. Pt refuses to acknowledge this and is adamant that we poisoned him. Offered to have a pharmacy tech come and review medications once more with him and he states "Oh, that won't be necessary." Also offered to notify the doctor and have her come speak with him, to which he states he already spoke with her about it on rounds and does not want to speak to her about it now. Pt states "this is not my first run in with [facility]". States he will be filing a grievance for making him sleepy.

## 2019-11-09 LAB — GLUCOSE, CAPILLARY
Glucose-Capillary: 93 mg/dL (ref 70–99)
Glucose-Capillary: 120 mg/dL — ABNORMAL HIGH (ref 70–99)
Glucose-Capillary: 113 mg/dL — ABNORMAL HIGH (ref 70–99)
Glucose-Capillary: 111 mg/dL — ABNORMAL HIGH (ref 70–99)
Glucose-Capillary: 120 mg/dL — ABNORMAL HIGH (ref 70–99)

## 2019-11-09 MED ORDER — DOCUSATE SODIUM 100 MG PO CAPS
100.0000 mg | ORAL_CAPSULE | Freq: Two times a day (BID) | ORAL | Status: DC
  Administered 2019-11-09 – 2019-11-10 (×3): 100 mg via ORAL
  Filled 2019-11-09 (×3): qty 1

## 2019-11-09 MED ORDER — SENNOSIDES-DOCUSATE SODIUM 8.6-50 MG PO TABS
2.0000 | ORAL_TABLET | Freq: Two times a day (BID) | ORAL | Status: DC
  Administered 2019-11-09 – 2019-11-10 (×3): 2 via ORAL
  Filled 2019-11-09 (×3): qty 2

## 2019-11-09 NOTE — Progress Notes (Signed)
PROGRESS NOTE    [name]  [id number] DOB: [birth year] DOA: 10/28/2019 PCP: Cesar Battles, MD    No chief complaint on file.   Brief Narrative:  [name] is a 79 y.o. 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 Patient presented from inpatient rehab for worsening LE weakness and spasticity likely secondary to severe lumbar stenosis. Neurosurgery on board.  MRI showed severe spinal stenosis at the level of L3 and L4 patient underwent laminectomy on 11/04/2019 and PT, OT evaluation recommending CIR.  CIR consult in place and TOC aware. Pt still appears uncomfortable, but not sleepy or lethargic. Wanted the RN to check for gout.   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 S/p surgery with laminectomy on 11/04/2019 Resume Robaxin, baclofen.,  Pain control with Vicodin 1 tab every 6 hours.  Patient reports that his pain is not well controlled and requested to increase the pain medication.  We will increase the Vicodin from 1 to 2 tablets every 6 hours as needed.  Recommend ambulation as tolerated.   PT OT recommending CIR. CIR on board, currently waiting for insurance authorization and bed availability.   Acute on chronic respiratory failure with hypoxia complicated by left  hemidiaphragm paralysis. On 79 yo 5 lit of N C oxygenthis am. Plan to wean him down to 2l it of oxygen.  Continue with incentive spirometry and CPAP at night.    Ascending aortic aneurysm CT angiogram of the chest showed a 4.2 cm ascending thoracic aortic aneurysm Recommend outpatient follow-up with PCP and annual CTA. Continue with simvastatin daily.  Essential hypertension Well controlled.    Hyperlipidemia Continue with simvastatin   History of chronic diastolic heart failure. Appears to be compensated continue with Lasix 40 mg daily and spironolactone 5 mg daily   Restless leg syndrome Continue with home medications.  No changes in his medications.   Hypothyroidism Continue with Synthroid 125 MCG daily.   Obstructive sleep apnea Continue CPAP at bedtime.   Constipation Resolved.   Myoclonic jerking Continue with Keppra.  Body mass index is 32.73 kg/m. Outpatient follow-up with PCP  BPH continue with Flomax   Leukocytosis Improving Appears to be reactive.  Constipation:  No BM in the last 24 hours, added senna and colace.    DVT prophylaxis: Heparin Code Status: Full code Family Communication: None at bedside Disposition:   Status is: Inpatient  Remains inpatient appropriate because:Inpatient level of care appropriate due to severity of illness   Dispo: The patient is from: CIR              Anticipated d/c is to: CIR              Anticipated d/c date is: 1 day              Patient currently is medically stable to d/c.  Consultants:   Neurosurgery   Procedures:5/18 s/p L3-4 MIS laminectomy   Antimicrobials:  Anti-infectives (From admission, onward)   Start     Dose/Rate Route Frequency Ordered Stop   11/04/19 1342  bacitracin 50,000 Units in sodium chloride 0.9 % 500 mL irrigation  Status:  Discontinued       As needed 11/04/19 1342 11/04/19 1459   11/04/19 1215  ceFAZolin (ANCEF) IVPB 2g/100 mL premix     2 g 200  mL/hr over 30 Minutes Intravenous  Once 11/04/19 1209 11/04/19 1326   11/04/19 1211  ceFAZolin (ANCEF) 2-4 GM/100ML-% IVPB    Note to Pharmacy: Henrine Screws   : cabinet override      11/04/19 1211 11/04/19 1339   10/29/19 0100  vancomycin (VANCOREADY) IVPB 1250 mg/250 mL  Status:  Discontinued     1,250 mg 166.7 mL/hr over 90 Minutes Intravenous Every 12 hours 10/28/19 2036 10/30/19 1228   10/28/19 2300  ceFEPIme (MAXIPIME) 2 g in sodium chloride 0.9 % 100 mL IVPB  Status:  Discontinued     2 g 200 mL/hr over 30 Minutes Intravenous Every 8 hours 10/28/19 2036 10/30/19 1228         Subjective: Patient wanted to be tested for gout.  Patient reports pain is the same as yesterday. Objective: Vitals:   11/08/19 2246 11/09/19 0128 11/09/19 0802 11/09/19 0808  BP:   120/70   Pulse: 81 84 66   Resp: 18 18 17    Temp:   97.8 F (36.6 C)   TempSrc:      SpO2: 93% 93% 100% 99%  Weight:      Height:        Intake/Output Summary (Last 24 hours) at 11/09/2019 1346 Last data filed at 11/09/2019 0622 Gross per 24 hour  Intake 3 ml  Output 850 ml  Net -847 ml   Filed Weights   11/04/19 0500 11/05/19 0500 11/07/19 0500  Weight: 100.5 kg 99.8 kg 94.8 kg    Examination:  General exam: Alert appears uncomfortable but not in distress on 2 L of nasal cannula oxygen. Respiratory system: Clear to auscultation bilaterally, no wheezing Cardiovascular system: S1 S2 heard, regular rate rhythm, no JVD Gastrointestinal system: Soft, nontender, nondistended, bowel sounds normal Central nervous system: Alert and oriented and answering all questions appropriately Extremities: Cyanosis or clubbing Skin: No rashes seen Psychiatry: Patient appears irritable but  not agitated  Data Reviewed: I have personally reviewed following labs and imaging studies  CBC: Recent Labs  Lab 11/04/19 0239 11/05/19 0243 11/08/19 1559  WBC 18.5* 13.9* 12.1*  NEUTROABS  --  12.1* 9.0*  HGB 12.2* 12.3* 11.5*   HCT 39.1 39.0 36.8*  MCV 91.1 90.5 90.4  PLT 210 204 [id number]    Basic Metabolic Panel: Recent Labs  Lab 11/04/19 0239 11/05/19 0243 11/08/19 1559  NA 141 142 141  K 4.0 4.5 4.2  CL 98 100 98  CO2 34* 29 33*  GLUCOSE 105* 150* 131*  BUN 21 22 24*  CREATININE 0.89 0.79 0.79  CALCIUM 9.0 9.2 9.0    GFR: Estimated Creatinine Clearance: 83.5 mL/min (by C-G formula based on SCr of 0.79 mg/dL).  Liver Function Tests: Recent Labs  Lab 11/05/19 0243 11/08/19 1559  AST 18 17  ALT 13 16  ALKPHOS 75 67  BILITOT 0.4 0.6  PROT 6.8 6.2*  ALBUMIN 3.5 3.1*    CBG: Recent Labs  Lab 11/08/19 0814 11/08/19 1154 11/08/19 1627 11/09/19 0804 11/09/19 1209  GLUCAP 88 143* 135* 93 120*     No results found for this or any previous visit (from the past 240 hour(s)).       Radiology Studies: No results found.      Scheduled Meds: . allopurinol  300 mg Oral Daily  . aspirin EC  81 mg Oral Daily  . atorvastatin  20 mg Oral q1800  . baclofen  30 mg Oral TID WC  . baclofen  40 mg Oral QHS  . calcium citrate  200 mg of elemental calcium Oral Daily  . carbidopa-levodopa  1 tablet Oral BID  . Chlorhexidine Gluconate Cloth  6 each Topical Daily  . collagenase   Topical Daily  . fluticasone furoate-vilanterol  1 puff Inhalation Daily  . furosemide  40 mg Oral Daily  . gabapentin  100 mg Oral QHS  . guaiFENesin  1,200 mg Oral BID  . heparin injection (subcutaneous)  5,000 Units Subcutaneous Q8H  . latanoprost  1 drop Both Eyes QHS  . levETIRAcetam  250 mg Oral BID  . levothyroxine  125 mcg Oral QAC breakfast  . metoprolol succinate  50 mg Oral Daily  . multivitamin with minerals  1 tablet Oral Daily  . pantoprazole  40 mg Oral QPM  . polyethylene glycol  17 g Oral Daily  . sodium chloride flush  3 mL Intravenous Q12H  . spironolactone  25 mg Oral Daily  . tamsulosin  0.8 mg Oral QPC supper  . tiZANidine  2 mg Oral TID  . umeclidinium bromide  1 puff Inhalation Daily    Continuous Infusions: . lactated ringers 10 mL/hr at 11/07/19 0807  . methocarbamol (ROBAXIN) IV       LOS: 12 days        Hosie Poisson, MD [facility]   To contact the attending provider between 7A-7P or the covering provider during after hours 7P-7A, please log into the web site [email or web address] and access using universal [place] password for that web site. If you do not have the password, please call the hospital operator.  11/09/2019, 1:46 PM

## 2019-11-09 NOTE — Progress Notes (Signed)
RT placed patient on CPAP with 5L O2 bled into circuit. Patient tolerating well at this time. RT will monitor as needed. 

## 2019-11-09 NOTE — Progress Notes (Signed)
Patient requested for CPAP pressure to be turned up. Increased max to 18. Vitals stable.

## 2019-11-10 ENCOUNTER — Inpatient Hospital Stay (HOSPITAL_COMMUNITY): Payer: Medicare Other

## 2019-11-10 ENCOUNTER — Inpatient Hospital Stay (HOSPITAL_COMMUNITY)
Admission: RE | Admit: 2019-11-10 | Discharge: 2019-11-25 | DRG: 052 | Disposition: A | Discharge status: Home / Self Care | Payer: Medicare Other | Source: Intra-hospital | Attending: Physical Medicine and Rehabilitation | Admitting: Physical Medicine and Rehabilitation

## 2019-11-10 DIAGNOSIS — K592 Neurogenic bowel, not elsewhere classified: Secondary | ICD-10-CM | POA: Diagnosis present

## 2019-11-10 DIAGNOSIS — R0602 Shortness of breath: Secondary | ICD-10-CM

## 2019-11-10 DIAGNOSIS — M109 Gout, unspecified: Secondary | ICD-10-CM | POA: Diagnosis present

## 2019-11-10 DIAGNOSIS — R0981 Nasal congestion: Secondary | ICD-10-CM | POA: Diagnosis not present

## 2019-11-10 DIAGNOSIS — Z6834 Body mass index (BMI) 34.0-34.9, adult: Secondary | ICD-10-CM

## 2019-11-10 DIAGNOSIS — I712 Thoracic aortic aneurysm, without rupture: Secondary | ICD-10-CM | POA: Diagnosis present

## 2019-11-10 DIAGNOSIS — I5032 Chronic diastolic (congestive) heart failure: Secondary | ICD-10-CM | POA: Diagnosis present

## 2019-11-10 DIAGNOSIS — Z87891 Personal history of nicotine dependence: Secondary | ICD-10-CM

## 2019-11-10 DIAGNOSIS — Z79899 Other long term (current) drug therapy: Secondary | ICD-10-CM

## 2019-11-10 DIAGNOSIS — L89153 Pressure ulcer of sacral region, stage 3: Secondary | ICD-10-CM | POA: Diagnosis present

## 2019-11-10 DIAGNOSIS — Z825 Family history of asthma and other chronic lower respiratory diseases: Secondary | ICD-10-CM | POA: Diagnosis not present

## 2019-11-10 DIAGNOSIS — Z96652 Presence of left artificial knee joint: Secondary | ICD-10-CM

## 2019-11-10 DIAGNOSIS — R252 Cramp and spasm: Secondary | ICD-10-CM

## 2019-11-10 DIAGNOSIS — E119 Type 2 diabetes mellitus without complications: Secondary | ICD-10-CM | POA: Diagnosis present

## 2019-11-10 DIAGNOSIS — G9589 Other specified diseases of spinal cord: Secondary | ICD-10-CM | POA: Diagnosis present

## 2019-11-10 DIAGNOSIS — M62838 Other muscle spasm: Secondary | ICD-10-CM | POA: Diagnosis not present

## 2019-11-10 DIAGNOSIS — G2581 Restless legs syndrome: Secondary | ICD-10-CM | POA: Diagnosis present

## 2019-11-10 DIAGNOSIS — Z9989 Dependence on other enabling machines and devices: Secondary | ICD-10-CM | POA: Diagnosis not present

## 2019-11-10 DIAGNOSIS — Z83438 Family history of other disorder of lipoprotein metabolism and other lipidemia: Secondary | ICD-10-CM | POA: Diagnosis not present

## 2019-11-10 DIAGNOSIS — Z981 Arthrodesis status: Secondary | ICD-10-CM

## 2019-11-10 DIAGNOSIS — Z8249 Family history of ischemic heart disease and other diseases of the circulatory system: Secondary | ICD-10-CM

## 2019-11-10 DIAGNOSIS — M4802 Spinal stenosis, cervical region: Secondary | ICD-10-CM | POA: Diagnosis present

## 2019-11-10 DIAGNOSIS — Z96653 Presence of artificial knee joint, bilateral: Secondary | ICD-10-CM | POA: Diagnosis present

## 2019-11-10 DIAGNOSIS — Z7982 Long term (current) use of aspirin: Secondary | ICD-10-CM

## 2019-11-10 DIAGNOSIS — H6123 Impacted cerumen, bilateral: Secondary | ICD-10-CM | POA: Diagnosis present

## 2019-11-10 DIAGNOSIS — Z8711 Personal history of peptic ulcer disease: Secondary | ICD-10-CM | POA: Diagnosis not present

## 2019-11-10 DIAGNOSIS — J449 Chronic obstructive pulmonary disease, unspecified: Secondary | ICD-10-CM | POA: Diagnosis present

## 2019-11-10 DIAGNOSIS — I110 Hypertensive heart disease with heart failure: Secondary | ICD-10-CM | POA: Diagnosis present

## 2019-11-10 DIAGNOSIS — M419 Scoliosis, unspecified: Secondary | ICD-10-CM | POA: Diagnosis present

## 2019-11-10 DIAGNOSIS — E669 Obesity, unspecified: Secondary | ICD-10-CM | POA: Diagnosis present

## 2019-11-10 DIAGNOSIS — G959 Disease of spinal cord, unspecified: Secondary | ICD-10-CM | POA: Diagnosis not present

## 2019-11-10 DIAGNOSIS — K2270 Barrett's esophagus without dysplasia: Secondary | ICD-10-CM | POA: Diagnosis present

## 2019-11-10 DIAGNOSIS — L89529 Pressure ulcer of left ankle, unspecified stage: Secondary | ICD-10-CM | POA: Diagnosis present

## 2019-11-10 DIAGNOSIS — E039 Hypothyroidism, unspecified: Secondary | ICD-10-CM | POA: Diagnosis not present

## 2019-11-10 DIAGNOSIS — M48061 Spinal stenosis, lumbar region without neurogenic claudication: Secondary | ICD-10-CM | POA: Diagnosis present

## 2019-11-10 DIAGNOSIS — G8929 Other chronic pain: Secondary | ICD-10-CM | POA: Diagnosis present

## 2019-11-10 DIAGNOSIS — G253 Myoclonus: Secondary | ICD-10-CM | POA: Diagnosis present

## 2019-11-10 DIAGNOSIS — I6529 Occlusion and stenosis of unspecified carotid artery: Secondary | ICD-10-CM | POA: Diagnosis present

## 2019-11-10 DIAGNOSIS — Z85850 Personal history of malignant neoplasm of thyroid: Secondary | ICD-10-CM

## 2019-11-10 DIAGNOSIS — G8222 Paraplegia, incomplete: Principal | ICD-10-CM | POA: Diagnosis present

## 2019-11-10 DIAGNOSIS — Z66 Do not resuscitate: Secondary | ICD-10-CM | POA: Diagnosis not present

## 2019-11-10 DIAGNOSIS — I10 Essential (primary) hypertension: Secondary | ICD-10-CM | POA: Diagnosis not present

## 2019-11-10 DIAGNOSIS — G8194 Hemiplegia, unspecified affecting left nondominant side: Secondary | ICD-10-CM | POA: Diagnosis present

## 2019-11-10 DIAGNOSIS — M5116 Intervertebral disc disorders with radiculopathy, lumbar region: Secondary | ICD-10-CM | POA: Diagnosis present

## 2019-11-10 DIAGNOSIS — Z841 Family history of disorders of kidney and ureter: Secondary | ICD-10-CM

## 2019-11-10 DIAGNOSIS — G8220 Paraplegia, unspecified: Secondary | ICD-10-CM | POA: Diagnosis present

## 2019-11-10 DIAGNOSIS — D72829 Elevated white blood cell count, unspecified: Secondary | ICD-10-CM | POA: Diagnosis not present

## 2019-11-10 DIAGNOSIS — J9610 Chronic respiratory failure, unspecified whether with hypoxia or hypercapnia: Secondary | ICD-10-CM | POA: Diagnosis present

## 2019-11-10 DIAGNOSIS — K219 Gastro-esophageal reflux disease without esophagitis: Secondary | ICD-10-CM | POA: Diagnosis present

## 2019-11-10 DIAGNOSIS — R531 Weakness: Secondary | ICD-10-CM | POA: Diagnosis not present

## 2019-11-10 DIAGNOSIS — L8990 Pressure ulcer of unspecified site, unspecified stage: Secondary | ICD-10-CM | POA: Diagnosis present

## 2019-11-10 DIAGNOSIS — M5134 Other intervertebral disc degeneration, thoracic region: Secondary | ICD-10-CM | POA: Diagnosis present

## 2019-11-10 DIAGNOSIS — Z833 Family history of diabetes mellitus: Secondary | ICD-10-CM

## 2019-11-10 DIAGNOSIS — G4700 Insomnia, unspecified: Secondary | ICD-10-CM | POA: Diagnosis present

## 2019-11-10 DIAGNOSIS — G4733 Obstructive sleep apnea (adult) (pediatric): Secondary | ICD-10-CM | POA: Diagnosis present

## 2019-11-10 DIAGNOSIS — E785 Hyperlipidemia, unspecified: Secondary | ICD-10-CM | POA: Diagnosis present

## 2019-11-10 DIAGNOSIS — M5137 Other intervertebral disc degeneration, lumbosacral region: Secondary | ICD-10-CM | POA: Diagnosis present

## 2019-11-10 DIAGNOSIS — Z79890 Hormone replacement therapy (postmenopausal): Secondary | ICD-10-CM

## 2019-11-10 LAB — CBC
HCT: 35.6 % — ABNORMAL LOW (ref 39.0–52.0)
Hemoglobin: 11.4 g/dL — ABNORMAL LOW (ref 13.0–17.0)
MCH: 28.8 pg (ref 26.0–34.0)
MCHC: 32 g/dL (ref 30.0–36.0)
MCV: 89.9 fL (ref 80.0–100.0)
Platelets: 269 10*3/uL (ref 150–400)
RBC: 3.96 MIL/uL — ABNORMAL LOW (ref 4.22–5.81)
RDW: 15 % (ref 11.5–15.5)
WBC: 13.3 10*3/uL — ABNORMAL HIGH (ref 4.0–10.5)
nRBC: 0 % (ref 0.0–0.2)

## 2019-11-10 LAB — CREATININE, SERUM
Creatinine, Ser: 0.94 mg/dL (ref 0.61–1.24)
GFR calc Af Amer: >60 mL/min (ref >60)
GFR calc non Af Amer: >60 mL/min (ref >60)

## 2019-11-10 LAB — URIC ACID: Uric Acid, Serum: 3.8 mg/dL (ref 3.7–8.6)

## 2019-11-10 IMAGING — DX DG CHEST 1V PORT
1 series · 1 of 1 positions shown · non-contrast
Comparison: Chest x-rays dated [DATE] and [DATE]

CLINICAL DATA: Follow-up pneumonia. Productive cough.

EXAM:
PORTABLE CHEST 1 VIEW

[chest]
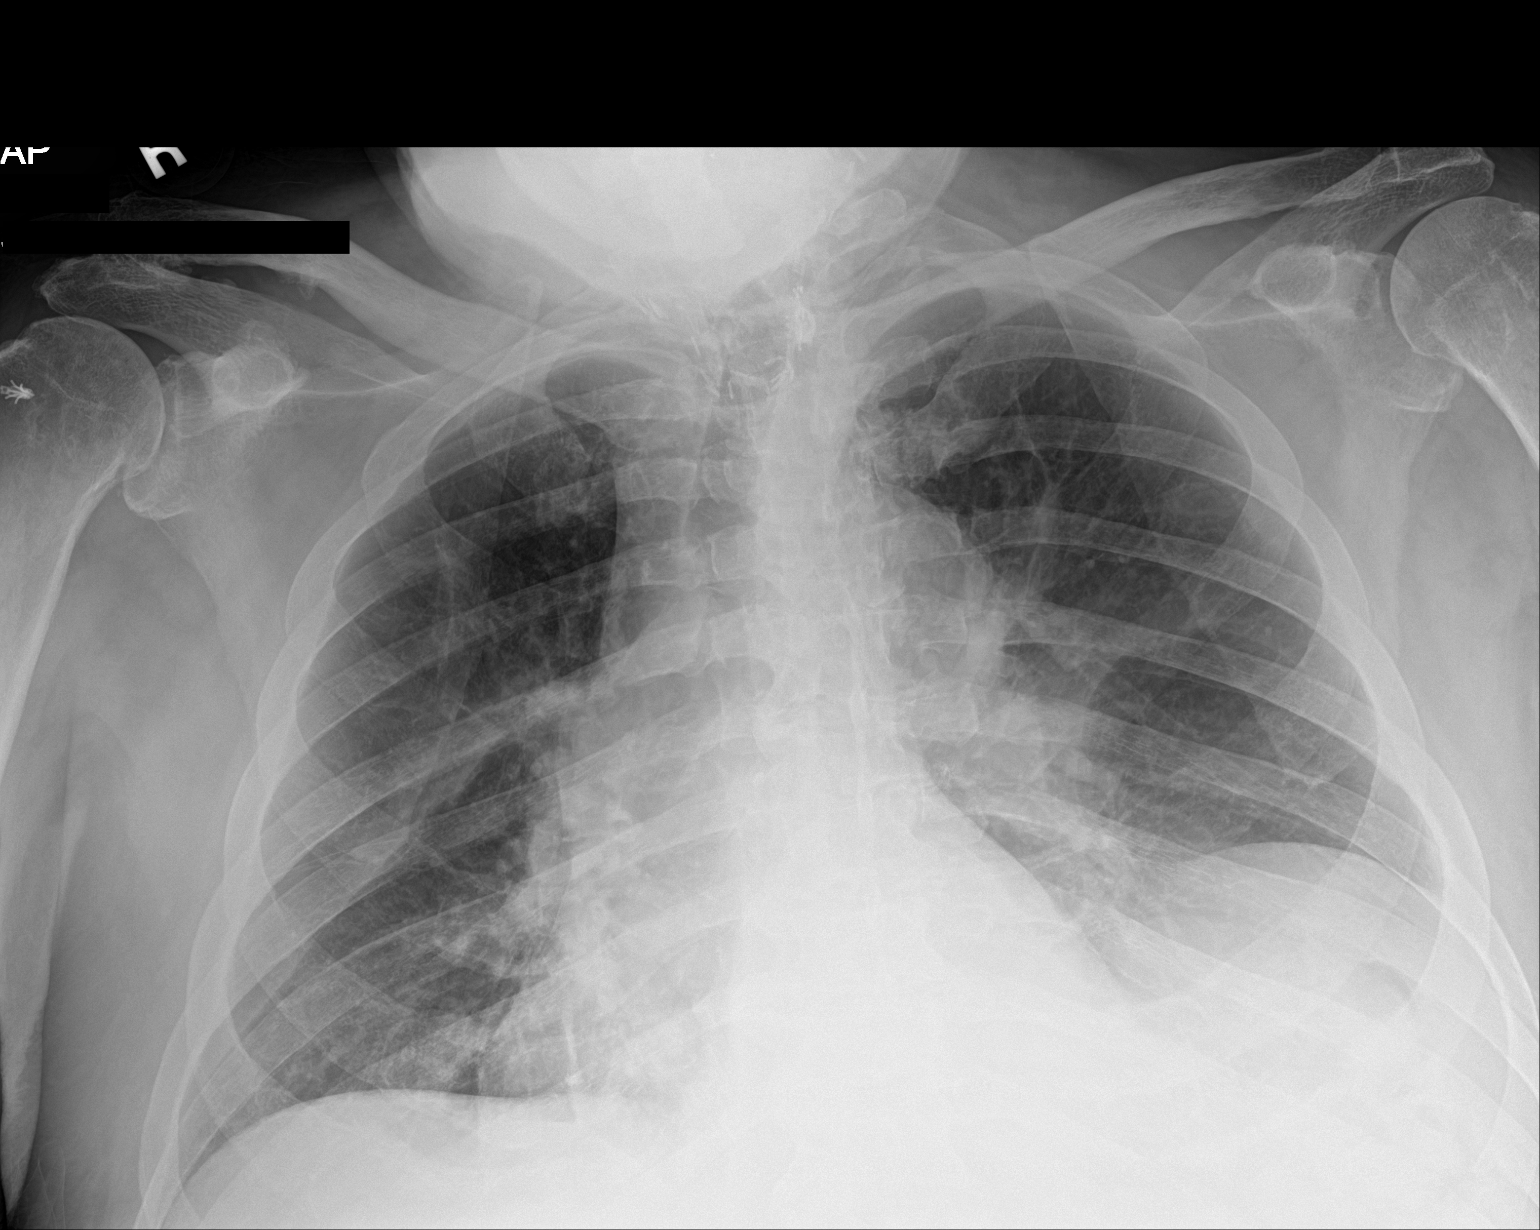

[1 of 1 positions shown; findings below may reference images not displayed]

FINDINGS: There has been significant reduction in the left pleural effusion
with slight residual left effusion and slight elevation of the left
hemidiaphragm and probable slight atelectasis at the left base.
There is slight thickening along the fissures in the right lung with
minimal atelectasis at the right base medially.

The heart size and pulmonary vascularity are normal. No acute bone
abnormality.
IMPRESSION: 1. Significant reduction in the left pleural effusion, now small.
2. Slight residual atelectasis at the left lung base.

## 2019-11-10 MED ORDER — ONDANSETRON HCL 4 MG/2ML IJ SOLN: 4.0000 mg | Freq: Four times a day (QID) | INTRAMUSCULAR | Status: DC | PRN

## 2019-11-10 MED ORDER — LEVETIRACETAM 250 MG PO TABS
250.0000 mg | ORAL_TABLET | Freq: Two times a day (BID) | ORAL | Status: DC
  Administered 2019-11-10 – 2019-11-11 (×2): 250 mg via ORAL
  Filled 2019-11-10 (×2): qty 1

## 2019-11-10 MED ORDER — SENNOSIDES-DOCUSATE SODIUM 8.6-50 MG PO TABS
2.0000 | ORAL_TABLET | Freq: Two times a day (BID) | ORAL | Status: DC
  Administered 2019-11-10 – 2019-11-25 (×29): 2 via ORAL
  Filled 2019-11-10 (×32): qty 2

## 2019-11-10 MED ORDER — ONDANSETRON HCL 4 MG PO TABS: 4.0000 mg | ORAL_TABLET | Freq: Four times a day (QID) | ORAL | Status: DC | PRN

## 2019-11-10 MED ORDER — POLYETHYLENE GLYCOL 3350 17 G PO PACK
17.0000 g | PACK | Freq: Every day | ORAL | Status: DC
  Administered 2019-11-11 – 2019-11-23 (×12): 17 g via ORAL
  Filled 2019-11-10 (×14): qty 1

## 2019-11-10 MED ORDER — HEPARIN SODIUM (PORCINE) 5000 UNIT/ML IJ SOLN
5000.0000 [IU] | Freq: Three times a day (TID) | INTRAMUSCULAR | Status: DC
  Administered 2019-11-10 – 2019-11-11 (×2): 5000 [IU] via SUBCUTANEOUS
  Filled 2019-11-10 (×2): qty 1

## 2019-11-10 MED ORDER — DOCUSATE SODIUM 100 MG PO CAPS
100.0000 mg | ORAL_CAPSULE | Freq: Two times a day (BID) | ORAL | Status: DC
  Administered 2019-11-10 – 2019-11-25 (×29): 100 mg via ORAL
  Filled 2019-11-10 (×30): qty 1

## 2019-11-10 MED ORDER — PANTOPRAZOLE SODIUM 40 MG PO TBEC
40.0000 mg | DELAYED_RELEASE_TABLET | Freq: Every evening | ORAL | Status: DC
  Administered 2019-11-11 – 2019-11-24 (×14): 40 mg via ORAL
  Filled 2019-11-10 (×14): qty 1

## 2019-11-10 MED ORDER — TAMSULOSIN HCL 0.4 MG PO CAPS
0.8000 mg | ORAL_CAPSULE | Freq: Every day | ORAL | Status: DC
  Administered 2019-11-10 – 2019-11-24 (×15): 0.8 mg via ORAL
  Filled 2019-11-10 (×15): qty 2

## 2019-11-10 MED ORDER — ATORVASTATIN CALCIUM 20 MG PO TABS: 20.0000 mg | ORAL_TABLET | Freq: Every day | ORAL | Status: DC

## 2019-11-10 MED ORDER — SENNOSIDES-DOCUSATE SODIUM 8.6-50 MG PO TABS: 2.0000 | ORAL_TABLET | Freq: Two times a day (BID) | ORAL | Status: DC

## 2019-11-10 MED ORDER — BACLOFEN 10 MG PO TABS: 30.0000 mg | ORAL_TABLET | Freq: Three times a day (TID) | ORAL | 0 refills | Status: DC

## 2019-11-10 MED ORDER — ATORVASTATIN CALCIUM 10 MG PO TABS
20.0000 mg | ORAL_TABLET | Freq: Every day | ORAL | Status: DC
  Administered 2019-11-11 – 2019-11-24 (×14): 20 mg via ORAL
  Filled 2019-11-10 (×14): qty 2

## 2019-11-10 MED ORDER — GUAIFENESIN ER 600 MG PO TB12
1200.0000 mg | ORAL_TABLET | Freq: Two times a day (BID) | ORAL | Status: DC
  Administered 2019-11-10 – 2019-11-25 (×30): 1200 mg via ORAL
  Filled 2019-11-10 (×31): qty 2

## 2019-11-10 MED ORDER — BACLOFEN 20 MG PO TABS
30.0000 mg | ORAL_TABLET | Freq: Three times a day (TID) | ORAL | Status: DC
  Administered 2019-11-11 (×2): 30 mg via ORAL
  Filled 2019-11-10 (×2): qty 1

## 2019-11-10 MED ORDER — TIZANIDINE HCL 2 MG PO TABS
2.0000 mg | ORAL_TABLET | Freq: Three times a day (TID) | ORAL | Status: DC
  Administered 2019-11-10 – 2019-11-13 (×8): 2 mg via ORAL
  Filled 2019-11-10 (×9): qty 1

## 2019-11-10 MED ORDER — SUMATRIPTAN SUCCINATE 50 MG PO TABS
50.0000 mg | ORAL_TABLET | ORAL | Status: DC | PRN
  Administered 2019-11-22 – 2019-11-23 (×2): 50 mg via ORAL
  Filled 2019-11-10 (×3): qty 1

## 2019-11-10 MED ORDER — COLLAGENASE 250 UNIT/GM EX OINT
TOPICAL_OINTMENT | Freq: Every day | CUTANEOUS | Status: DC
  Filled 2019-11-10 (×2): qty 30

## 2019-11-10 MED ORDER — SUMATRIPTAN SUCCINATE 50 MG PO TABS: 50.0000 mg | ORAL_TABLET | ORAL | 0 refills | Status: DC | PRN

## 2019-11-10 MED ORDER — BACLOFEN 20 MG PO TABS
40.0000 mg | ORAL_TABLET | Freq: Every day | ORAL | Status: DC
  Administered 2019-11-10: 40 mg via ORAL
  Filled 2019-11-10: qty 2

## 2019-11-10 MED ORDER — NITROGLYCERIN 0.4 MG SL SUBL: 0.4000 mg | SUBLINGUAL_TABLET | SUBLINGUAL | Status: DC | PRN

## 2019-11-10 MED ORDER — METHOCARBAMOL 750 MG PO TABS: 750.0000 mg | ORAL_TABLET | Freq: Four times a day (QID) | ORAL | Status: DC | PRN

## 2019-11-10 MED ORDER — GABAPENTIN 100 MG PO CAPS
100.0000 mg | ORAL_CAPSULE | Freq: Every day | ORAL | Status: DC
  Administered 2019-11-10 – 2019-11-24 (×15): 100 mg via ORAL
  Filled 2019-11-10 (×15): qty 1

## 2019-11-10 MED ORDER — HYDROCODONE-ACETAMINOPHEN 5-325 MG PO TABS: 1.0000 | ORAL_TABLET | Freq: Four times a day (QID) | ORAL | 0 refills | Status: DC | PRN

## 2019-11-10 MED ORDER — CALCIUM CITRATE 950 (200 CA) MG PO TABS
200.0000 mg | ORAL_TABLET | Freq: Every day | ORAL | Status: DC
  Administered 2019-11-11 – 2019-11-25 (×15): 200 mg via ORAL
  Filled 2019-11-10 (×17): qty 1

## 2019-11-10 MED ORDER — SPIRONOLACTONE 25 MG PO TABS
25.0000 mg | ORAL_TABLET | Freq: Every day | ORAL | Status: DC
  Administered 2019-11-11 – 2019-11-25 (×15): 25 mg via ORAL
  Filled 2019-11-10 (×16): qty 1

## 2019-11-10 MED ORDER — HYDROCODONE-ACETAMINOPHEN 5-325 MG PO TABS
1.0000 | ORAL_TABLET | ORAL | Status: DC | PRN
  Administered 2019-11-10 (×2): 1 via ORAL
  Filled 2019-11-10 (×2): qty 1

## 2019-11-10 MED ORDER — BACLOFEN 20 MG PO TABS: 30.0000 mg | ORAL_TABLET | Freq: Three times a day (TID) | ORAL | Status: DC

## 2019-11-10 MED ORDER — IPRATROPIUM-ALBUTEROL 0.5-2.5 (3) MG/3ML IN SOLN: 3.0000 mL | RESPIRATORY_TRACT | Status: DC | PRN

## 2019-11-10 MED ORDER — BACLOFEN 20 MG PO TABS: 40.0000 mg | ORAL_TABLET | Freq: Every day | ORAL | Status: DC

## 2019-11-10 MED ORDER — ACETAMINOPHEN 325 MG PO TABS
650.0000 mg | ORAL_TABLET | Freq: Four times a day (QID) | ORAL | Status: DC | PRN
  Administered 2019-11-13: 650 mg via ORAL
  Filled 2019-11-10 (×2): qty 2

## 2019-11-10 MED ORDER — FUROSEMIDE 40 MG PO TABS
40.0000 mg | ORAL_TABLET | Freq: Every day | ORAL | Status: DC
  Administered 2019-11-11 – 2019-11-25 (×15): 40 mg via ORAL
  Filled 2019-11-10 (×16): qty 1

## 2019-11-10 MED ORDER — METOPROLOL SUCCINATE ER 50 MG PO TB24
50.0000 mg | ORAL_TABLET | Freq: Every day | ORAL | Status: DC
  Administered 2019-11-11 – 2019-11-14 (×4): 50 mg via ORAL
  Filled 2019-11-10 (×6): qty 1

## 2019-11-10 MED ORDER — ALLOPURINOL 300 MG PO TABS
300.0000 mg | ORAL_TABLET | Freq: Every day | ORAL | Status: DC
  Administered 2019-11-11: 300 mg via ORAL
  Filled 2019-11-10: qty 1

## 2019-11-10 MED ORDER — ALBUTEROL SULFATE (2.5 MG/3ML) 0.083% IN NEBU: 2.5000 mg | INHALATION_SOLUTION | Freq: Four times a day (QID) | RESPIRATORY_TRACT | Status: DC | PRN

## 2019-11-10 MED ORDER — DOCUSATE SODIUM 100 MG PO CAPS: 100.0000 mg | ORAL_CAPSULE | Freq: Two times a day (BID) | ORAL | 0 refills | Status: DC

## 2019-11-10 MED ORDER — ASPIRIN EC 81 MG PO TBEC
81.0000 mg | DELAYED_RELEASE_TABLET | Freq: Every day | ORAL | Status: DC
  Administered 2019-11-11 – 2019-11-25 (×15): 81 mg via ORAL
  Filled 2019-11-10 (×17): qty 1

## 2019-11-10 MED ORDER — CARBIDOPA-LEVODOPA 10-100 MG PO TABS
1.0000 | ORAL_TABLET | Freq: Two times a day (BID) | ORAL | Status: DC
  Administered 2019-11-11 – 2019-11-25 (×29): 1 via ORAL
  Filled 2019-11-10 (×28): qty 1

## 2019-11-10 MED ORDER — UMECLIDINIUM BROMIDE 62.5 MCG/INH IN AEPB
1.0000 | INHALATION_SPRAY | Freq: Every day | RESPIRATORY_TRACT | Status: DC
  Administered 2019-11-11 – 2019-11-25 (×13): 1 via RESPIRATORY_TRACT
  Filled 2019-11-10: qty 7

## 2019-11-10 MED ORDER — LEVOTHYROXINE SODIUM 25 MCG PO TABS
125.0000 ug | ORAL_TABLET | Freq: Every day | ORAL | Status: DC
  Administered 2019-11-11 – 2019-11-25 (×15): 125 ug via ORAL
  Filled 2019-11-10 (×15): qty 1

## 2019-11-10 MED ORDER — FLUTICASONE FUROATE-VILANTEROL 200-25 MCG/INH IN AEPB
1.0000 | INHALATION_SPRAY | Freq: Every day | RESPIRATORY_TRACT | Status: DC
  Administered 2019-11-11 – 2019-11-25 (×13): 1 via RESPIRATORY_TRACT

## 2019-11-10 MED ORDER — METHOCARBAMOL 750 MG PO TABS
750.0000 mg | ORAL_TABLET | Freq: Four times a day (QID) | ORAL | Status: DC | PRN
  Administered 2019-11-11 – 2019-11-25 (×14): 750 mg via ORAL
  Filled 2019-11-10 (×17): qty 1

## 2019-11-10 MED ORDER — HEPARIN SODIUM (PORCINE) 5000 UNIT/ML IJ SOLN: 5000.0000 [IU] | Freq: Three times a day (TID) | INTRAMUSCULAR | Status: DC

## 2019-11-10 MED ORDER — SORBITOL 70 % SOLN: 30.0000 mL | Freq: Every day | Status: DC | PRN

## 2019-11-10 MED ORDER — ADULT MULTIVITAMIN W/MINERALS CH
1.0000 | ORAL_TABLET | Freq: Every day | ORAL | Status: DC
  Administered 2019-11-11 – 2019-11-25 (×15): 1 via ORAL
  Filled 2019-11-10 (×16): qty 1

## 2019-11-10 MED ORDER — LATANOPROST 0.005 % OP SOLN
1.0000 [drp] | Freq: Every day | OPHTHALMIC | Status: DC
  Administered 2019-11-10 – 2019-11-24 (×15): 1 [drp] via OPHTHALMIC
  Filled 2019-11-10: qty 2.5

## 2019-11-10 MED ORDER — HYDROCODONE-ACETAMINOPHEN 5-325 MG PO TABS
1.0000 | ORAL_TABLET | ORAL | Status: DC | PRN
  Administered 2019-11-10 – 2019-11-25 (×38): 1 via ORAL
  Filled 2019-11-10 (×38): qty 1

## 2019-11-10 MED ORDER — TRAZODONE HCL 50 MG PO TABS
50.0000 mg | ORAL_TABLET | Freq: Every evening | ORAL | Status: DC | PRN
  Administered 2019-11-14 – 2019-11-23 (×7): 50 mg via ORAL
  Filled 2019-11-10 (×8): qty 1

## 2019-11-10 MED ORDER — CALCIUM CARBONATE ANTACID 500 MG PO CHEW: 1.0000 | CHEWABLE_TABLET | ORAL | Status: DC | PRN

## 2019-11-10 NOTE — Progress Notes (Signed)
Charlett Blake, MD  Physician  Physical Medicine and Rehabilitation  PMR Pre-admission     Signed  Date of Service:  11/07/2019  4:02 PM      Related encounter: Admission (Current) from 10/28/2019 in [place] [facility]      Signed        PMR Admission Coordinator Pre-Admission Assessment   Patient: [name] is an 79 y.o., male MRN: [id number] DOB: December 31, 1940 Height: '5\' 7"'  (170.2 cm) Weight: 94.8 kg   Insurance Information HMO:     PPO: yes     PCP:      IPA:      80/20:      OTHER:  PRIMARY: U[employer] Medicare      Policy#: [id number]      Subscriber: patient CM Name: Bubba Hales       Phone#: [phone]     Fax#: [phone] Pre-Cert#: [id number]      Employer:  Josem Kaufmann provided by Bubba Hales for admit to CIR. Pt is approved for 8 days with start date 11/10/19 and next review date 5/31. Clinical updates are to be faxed to [phone] Benefits:  Phone #: [phone]     Name: [email or web address] Eff. Date: 06/20/19-06/18/20     Deduct: does not have ($0)      Out of Pocket Max: $3,900 ($2,526.20 met)      Life Max: NA CIR: $345/day co-pay for days 1-5, $0/day co-pay for days 6+      SNF: $0/day co-pay for days 1-20, $184/day co-pay for days 21-42, $0/day co-pay for days 43-100; limited to 100 days/cal yr Outpatient: $35/visit co-pay; limited by medical necessity     Home Health: 100% coverage, 0% co-insurance; limited by medical necessity       DME: 80% coverage, 20% co-insurance    Providers:  SECONDARY: None      Policy#:      Phone#:    Development worker, community:       Phone#:    The Therapist, art Information Summary" for patients in Inpatient Rehabilitation Facilities with attached "Privacy Act [place] Records" was provided and verbally reviewed with: Patient   Emergency Contact Information         Contact Information     Name Relation Home Work Mobile    Theisen,Susan Spouse [phone]   [phone]    Alvino Blood Daughter [phone]         Wynne, Jury [phone]             Current Medical History  Patient Admitting Diagnosis: Incomplete paraplegia complicated by acute on chronic respiratory failure with hypoxia.    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 Brett Fairy of neurology services as well as neurosurgery, bowel bladder incontinence.  Per chart review lives with spouse 1 level home with ramped entrance.  Needed assistance for dressing as well as transfers.  He has a PCA who does provide assistance.  He was able to ambulate short household distances with a walker.  Presented 09/17/2019 with generalized fatigue over the last couple of days and decrease in  functional mobility.  Cranial CT scan was negative for acute changes.  Admission chemistries BUN 43, creatinine 1.64, WBC 13,000, troponin negative, urinalysis negative nitrite with urine culture 30,000 diphtheroids, blood cultures no growth to date.  MRI thoracic lumbar cervical spine showed chronic changes with patchy cord signal abnormality at the C3-4 level consistent with chronic myelomalacia.  Multilevel degenerative disc disease of the lower thoracic spine without high-grade spinal stenosis as well as lumbar spondylosis and congenitally short pedicles causing prominent impingement at L2-3 and 3-4 with moderate impingement L1-2 and 4-5 as well as T12-L1 and L5-S1.  Impingement of L1-2 and 2-3 minimally worsened from prior exam 07/24/2017.  Neurosurgery Dr. Kristeen Miss consulted in  regards to MRI findings patient not felt to be a surgical candidate advised IV Solu-Medrol then transition to prolonged prednisone with taper.  Echocardiogram with ejection fraction 60% no wall motion abnormalities.  Chronic pain control ongoing with the use of scheduled baclofen titrated to 15 mg 3 times daily as well as nightly Neurontin and OxyContin 10 mg every 12 hours.  Therapy evaluations completed and patient was admitted to inpatient rehab services 09/26/2019 with slow overall progress.  Ongoing care of patient's spasticity with follow-up recommendations followed by neurology as well as neurosurgery and considerations were even made for a possible baclofen pump.  Follow-up cervical lumbar thoracic myelogram completed showing severe degenerative with mild scoliosis and listhesis as well as L3-4 severe spinal stenosis L2-3 moderate to advanced spinal stenosis L1-2 moderate spinal stenosis.  Resolved cord compression after ACDF.  There was some bilateral foraminal narrowing noted.  Thoracic spine diffuse degenerative disease without cord compression.  Neurosurgery again discussed at length with family in regards to his weakness spasticity myelogram findings recommendations for L3-4 decompression.  During patient's rehabilitation stay he did remain on Lovenox for DVT prophylaxis OSA with CPAP chronic oxygen monitoring followed by pulmonary services.  Wound care consulted 10/23/2019 for left ankle ulcer as well as left buttock with wound care as directed.  Left ankle films were completed showing no evidence of osteomyelitis fracture or other acute abnormality no visible abscess.  Patient was transferred to acute care services 10/28/2019 and underwent lumbar 3-4 laminectomy decompression 11/04/2019 per Dr. Venetia Constable.  Palliative care was consulted to establish goals of care.  He was placed on subcutaneous heparin for DVT prophylaxis.  CPAP ongoing as well as oxygen needs for history of OSA/chronic respiratory failure.   Wound care ongoing for left buttock sacral wound and left lateral ankle wound.  Therapy evaluations completed and patient is to be admitted for a comprehensive rehab program on 11/10/19.   Patient's medical record from [facility] has been reviewed by the rehabilitation admission coordinator and physician.   Past Medical History      Past Medical History:  Diagnosis Date  . Arthritis    . Barrett esophagus    . Bronchitis    . Carotid artery occlusion    . COPD (chronic obstructive pulmonary disease) ([place])    . Diabetes mellitus without complication ([place])    . GERD (gastroesophageal reflux disease)    . Headache      migraines  . History of thyroid cancer 2008  . Hypertension    . Restless leg    . Sleep apnea with use of continuous positive airway pressure (CPAP)      2011 [facility] , AHI  77cn central and obstrcutive. 16 cm water , 3 cm EPR.   . Thoracic ascending aortic aneurysm (  [place])      4.2 cm ascending TAA 08/18/17 CTA. Annual imaging recommended.  . thyroid ca dx'd 2009 or 2010    surg and radioactive isoptope  . Ulcer      Peptic ulcer disease      Family History   family history includes COPD in his mother; Cancer in his father; Diabetes in his father; Hyperlipidemia in his father and mother; Hypertension in his father and mother; Kidney disease in his father.   Prior Rehab/Hospitalizations Has the patient had prior rehab or hospitalizations prior to admission? Yes   Has the patient had major surgery during 100 days prior to admission? Yes              Current Medications   Current Facility-Administered Medications:  .  acetaminophen (TYLENOL) tablet 650 mg, 650 mg, Oral, Q6H PRN, Smith, Rondell A, MD .  albuterol (PROVENTIL) (2.5 MG/3ML) 0.083% nebulizer solution 2.5 mg, 2.5 mg, Nebulization, Q6H PRN, Smith, Rondell A, MD .  allopurinol (ZYLOPRIM) tablet 300 mg, 300 mg, Oral, Daily, Smith, Rondell A, MD, 300 mg at 11/10/19 1052 .  aspirin EC  tablet 81 mg, 81 mg, Oral, Daily, Smith, Rondell A, MD, 81 mg at 11/10/19 1052 .  atorvastatin (LIPITOR) tablet 20 mg, 20 mg, Oral, q1800, Mariel Aloe, MD, 20 mg at 11/09/19 1714 .  baclofen (LIORESAL) tablet 30 mg, 30 mg, Oral, TID WC, Smith, Rondell A, MD, 30 mg at 11/10/19 1535 .  baclofen (LIORESAL) tablet 40 mg, 40 mg, Oral, QHS, Smith, Rondell A, MD, 40 mg at 11/09/19 2303 .  calcium carbonate (TUMS - dosed in mg elemental calcium) chewable tablet 200 mg of elemental calcium, 1 tablet, Oral, PRN, Hosie Poisson, MD, 200 mg of elemental calcium at 11/05/19 2334 .  calcium citrate (CALCITRATE - dosed in mg elemental calcium) tablet 200 mg of elemental calcium, 200 mg of elemental calcium, Oral, Daily, Smith, Rondell A, MD, 200 mg of elemental calcium at 11/10/19 1049 .  carbidopa-levodopa (SINEMET IR) 10-100 MG per tablet immediate release 1 tablet, 1 tablet, Oral, BID, Tamala Julian, Rondell A, MD, 1 tablet at 11/10/19 1539 .  Chlorhexidine Gluconate Cloth 2 % PADS 6 each, 6 each, Topical, Daily, Mariel Aloe, MD, 6 each at 11/10/19 1054 .  collagenase (SANTYL) ointment, , Topical, Daily, Norval Morton, MD, Given at 11/10/19 1054 .  docusate sodium (COLACE) capsule 100 mg, 100 mg, Oral, BID, Hosie Poisson, MD, 100 mg at 11/10/19 1050 .  fluticasone furoate-vilanterol (BREO ELLIPTA) 200-25 MCG/INH 1 puff, 1 puff, Inhalation, Daily, Fuller Plan A, MD, 1 puff at 11/10/19 0953 .  furosemide (LASIX) tablet 40 mg, 40 mg, Oral, Daily, Mariel Aloe, MD, 40 mg at 11/10/19 1054 .  gabapentin (NEURONTIN) capsule 100 mg, 100 mg, Oral, QHS, Smith, Rondell A, MD, 100 mg at 11/09/19 2257 .  guaiFENesin (MUCINEX) 12 hr tablet 1,200 mg, 1,200 mg, Oral, BID, Smith, Rondell A, MD, 1,200 mg at 11/10/19 1052 .  heparin injection 5,000 Units, 5,000 Units, Subcutaneous, Q8H, Hosie Poisson, MD, 5,000 Units at 11/10/19 1541 .  HYDROcodone-acetaminophen (NORCO/VICODIN) 5-325 MG per tablet 1 tablet, 1 tablet, Oral,  Q3H PRN, Hosie Poisson, MD, 1 tablet at 11/10/19 1534 .  ipratropium-albuterol (DUONEB) 0.5-2.5 (3) MG/3ML nebulizer solution 3 mL, 3 mL, Nebulization, Q4H PRN, Smith, Rondell A, MD .  labetalol (NORMODYNE) injection 10 mg, 10 mg, Intravenous, Q2H PRN, Opyd, Ilene Qua, MD .  lactated ringers infusion, , Intravenous, Continuous, Oleta Mouse, MD,  Last Rate: 10 mL/hr at 11/07/19 0807, IV Pump Association at 11/07/19 0807 .  latanoprost (XALATAN) 0.005 % ophthalmic solution 1 drop, 1 drop, Both Eyes, QHS, Smith, Rondell A, MD, 1 drop at 11/09/19 2252 .  levETIRAcetam (KEPPRA) tablet 250 mg, 250 mg, Oral, BID, Smith, Rondell A, MD, 250 mg at 11/10/19 1050 .  levothyroxine (SYNTHROID) tablet 125 mcg, 125 mcg, Oral, QAC breakfast, Opyd, Ilene Qua, MD, 125 mcg at 11/10/19 0636 .  methocarbamol (ROBAXIN) 500 mg in dextrose 5 % 50 mL IVPB, 500 mg, Intravenous, Once, Mariel Aloe, MD .  methocarbamol (ROBAXIN) tablet 750 mg, 750 mg, Oral, Q6H PRN, Hosie Poisson, MD, 750 mg at 11/07/19 2339 .  metoprolol succinate (TOPROL-XL) 24 hr tablet 50 mg, 50 mg, Oral, Daily, Mariel Aloe, MD, 50 mg at 11/10/19 1052 .  multivitamin with minerals tablet 1 tablet, 1 tablet, Oral, Daily, Tamala Julian, Rondell A, MD, 1 tablet at 11/10/19 1050 .  nitroGLYCERIN (NITROSTAT) SL tablet 0.4 mg, 0.4 mg, Sublingual, Q5 min PRN, Smith, Rondell A, MD .  ondansetron (ZOFRAN) tablet 4 mg, 4 mg, Oral, Q6H PRN **OR** ondansetron (ZOFRAN) injection 4 mg, 4 mg, Intravenous, Q6H PRN, Tamala Julian, Rondell A, MD, 4 mg at 11/04/19 2310 .  pantoprazole (PROTONIX) EC tablet 40 mg, 40 mg, Oral, QPM, Smith, Rondell A, MD, 40 mg at 11/09/19 1716 .  polyethylene glycol (MIRALAX / GLYCOLAX) packet 17 g, 17 g, Oral, Daily, Mariel Aloe, MD, 17 g at 11/10/19 1055 .  senna-docusate (Senokot-S) tablet 2 tablet, 2 tablet, Oral, BID, Hosie Poisson, MD, 2 tablet at 11/10/19 1053 .  sodium chloride flush (NS) 0.9 % injection 3 mL, 3 mL, Intravenous, Q12H,  Smith, Rondell A, MD, 3 mL at 11/10/19 1055 .  spironolactone (ALDACTONE) tablet 25 mg, 25 mg, Oral, Daily, Mariel Aloe, MD, 25 mg at 11/10/19 1054 .  SUMAtriptan (IMITREX) tablet 50 mg, 50 mg, Oral, Q2H PRN, Tamala Julian, Rondell A, MD, 50 mg at 11/06/19 1316 .  tamsulosin (FLOMAX) capsule 0.8 mg, 0.8 mg, Oral, QPC supper, Tamala Julian, Rondell A, MD, 0.8 mg at 11/09/19 1715 .  tiZANidine (ZANAFLEX) tablet 2 mg, 2 mg, Oral, TID, Smith, Rondell A, MD, 2 mg at 11/10/19 1539 .  traZODone (DESYREL) tablet 50 mg, 50 mg, Oral, QHS PRN, Tamala Julian, Rondell A, MD, 50 mg at 10/30/19 2317 .  umeclidinium bromide (INCRUSE ELLIPTA) 62.5 MCG/INH 1 puff, 1 puff, Inhalation, Daily, Mariel Aloe, MD, 1 puff at 11/10/19 3557   Patients Current Diet:     Diet Order                      Diet 2 gram sodium Room service appropriate? Yes; Fluid consistency: Thin  Diet effective now                   Precautions / Restrictions Precautions Precautions: Fall, Back Precaution Booklet Issued: No Precaution Comments: L lateral ankle ulcer, L buttock/sacral ulcer Restrictions Weight Bearing Restrictions: No    Has the patient had 2 or more falls or a fall with injury in the past year? Yes   Prior Activity Level Household: not working or driving PTA; Supervision/Min A for ADLs and ambulation prior to hospital admission on 09/18/19   Prior Functional Level Self Care: Did the patient need help bathing, dressing, using the toilet or eating? Needed some help   Indoor Mobility: Did the patient need assistance with walking from room to room (with or  without device)? Needed some help   Stairs: Did the patient need assistance with internal or external stairs (with or without device)? Dependent   Functional Cognition: Did the patient need help planning regular tasks such as shopping or remembering to take medications? Needed some help   Home Assistive Devices / Equipment Home Assistive Devices/Equipment: CPAP, Eyeglasses,  Grab bars around toilet, Grab bars in shower, [employer], Civil engineer, contracting without back, Wheelchair, Environmental consultant (specify type)(upper lower dentures, front wheel walker, walk-in shower) Home Equipment: Environmental consultant - 2 wheels, Wheelchair - manual, Bedside commode, Shower seat, Grab bars - tub/shower, Other (comment), Hand held shower head, Adaptive equipment   Prior Device Use: Indicate devices/aids used by the patient prior to current illness, exacerbation or injury? Walker and wc   Current Functional Level Cognition   Arousal/Alertness: Awake/alert Overall Cognitive Status: Within Functional Limits for tasks assessed Orientation Level: Oriented X4 General Comments: pt directs his care very well (aware of precautions, movements that cause spascity, hospital environment); pleasant Attention: Focused, Sustained Focused Attention: Appears intact Sustained Attention: Appears intact Memory: Appears intact Awareness: Appears intact Problem Solving: Appears intact Safety/Judgment: Appears intact    Extremity Assessment (includes Sensation/Coordination)   Upper Extremity Assessment: Defer to OT evaluation  Lower Extremity Assessment: RLE deficits/detail, LLE deficits/detail RLE Deficits / Details: Intermittent spasming  R greater than L,  Sitting EOB, pt's R LE tested at 3-/5, but on a limited basis.  Resistance to movement/spasm at a 4/5 on the mod [place]. RLE Coordination: decreased fine motor, decreased gross motor LLE Deficits / Details: intermittent spasming, LE spasm able to be inhibited enough to attain full knee extention.  L with less spasm than R LE and strength tested at >=3+/5 LLE Coordination: decreased fine motor     ADLs   Overall ADL's : Needs assistance/impaired Eating/Feeding: Sitting, Total assistance Eating/Feeding Details (indicate cue type and reason): requried total to drink sitting EOB; pt declined holding cup independently d/t feeling "too unsteady" Grooming: Wash/dry face,  Brushing hair Grooming Details (indicate cue type and reason): simulated with pt. reaching to beard with R hand with no lob noted, also bringing R hand to top of head.  alternated and ws able to bring L hand to top of head before slight lob and able to self correct with ue support on bed rail with L hand and bed mattress with R hand. Upper Body Bathing: Moderate assistance, Sitting Lower Body Bathing: Maximal assistance, Sitting/lateral leans Upper Body Dressing : Moderate assistance Lower Body Dressing: Total assistance, Bed level Lower Body Dressing Details (indicate cue type and reason): diaper Toilet Transfer Details (indicate cue type and reason): defer transfer but able to transition EOB with MAX A+2 and intermittent MIN A for sitting balance Toileting- Clothing Manipulation and Hygiene: Total assistance, Bed level Toileting - Clothing Manipulation Details (indicate cue type and reason): pt with bowel incontinence, provided pericare and replaced sacral dressing Functional mobility during ADLs: Maximal assistance, +2 for physical assistance(bed mobility only) General ADL Comments: pt. able to simulate beginning movements for grooming tasks while seated eob.     Mobility   Overal bed mobility: Needs Assistance Bed Mobility: Rolling, Sidelying to Sit Rolling: +2 for safety/equipment, Max assist Sidelying to sit: +2 for physical assistance, Max assist Supine to sit: Max assist, +2 for physical assistance Sit to supine: Total assist, +2 for physical assistance, HOB elevated General bed mobility comments: able to initiate reaching for rails and attempted to push and assist with bringing trunk upright.  Transfers   Overall transfer level: Needs assistance Transfer via Lift Equipment: Maximove Transfers: Lateral/Scoot Transfers  Lateral/Scoot Transfers: Max assist, +2 physical assistance, From elevated surface General transfer comment: seated eob with intermittent Bil UE support on bed  rail and mattress; Able to push through Bil UEs and aide in initiation of side scooting to the R to recliner;  once in recliner able to utilize arm rests and scoot each hip back with lateral leans.  Required cues for sequencing and technique     Ambulation / Gait / Stairs / Wheelchair Mobility   Ambulation/Gait General Gait Details: not attempted     Posture / Balance Static Sitting Balance Static Sitting - Balance Support: Feet supported, Bilateral upper extremity supported Static Sitting - Level of Assistance: 4: Min assist Dynamic Sitting Balance Sitting balance - Comments: Pt sat seated at EOB or during transfers for at least 8 mins.  Intially, required min A but able to progress to supervision with Bil UE support.  Did tolerate 1 min sitting with both hands in lap and challenges within BOS. Balance Overall balance assessment: Needs assistance Sitting-balance support: Feet supported, Single extremity supported, Bilateral upper extremity supported, No upper extremity supported Sitting balance-Leahy Scale: Fair Sitting balance - Comments: Pt sat seated at EOB or during transfers for at least 8 mins.  Intially, required min A but able to progress to supervision with Bil UE support.  Did tolerate 1 min sitting with both hands in lap and challenges within BOS.     Special needs/care consideration CPAP orders in chart   Oxygen: 5L   Special Bed: may benefit from air mattress due to decreased ability to offload   Skin: abrasion to right knee, blister to left finger, ecchymosis to right,left arm, MASD to left buttocks, skin tear to right and left arm; pressure injury to coccyx (left unstagable), surgical incision to back.     Diabetic management: yes    and Designated visitor: wife Wagoner (from acute therapy documentation) Living Arrangements: Spouse/significant other  Lives With: Spouse Available Help at Discharge: Family, Available 24 hours/day, Personal care  attendant Type of Home: House Home Layout: One level Home Access: Ramped entrance Bathroom Shower/Tub: Multimedia programmer: Handicapped height Bathroom Accessibility: Yes How Accessible: Accessible via wheelchair [place]: Yes Type of [place]: [place] (if known): [place] Additional Comments: PCA assists with bed mobility and dressing (transfers). Pt d/c from [place] 10/27/19   Discharge Living Setting Plans for Discharge Living Setting: Patient's home, Lives with (comment)(lives with wife; has caregiver 6 hours/day 7 days/week) Type of Home at Discharge: House Discharge Home Layout: One level Discharge Home Access: [place] entrance Discharge Bathroom Shower/Tub: Walk-in shower Discharge Bathroom Toilet: Standard Discharge Bathroom Accessibility: Yes How Accessible: Accessible via wheelchair Does the patient have any problems obtaining your medications?: No   Social/Family/Support Systems Patient Roles: Spouse Contact Information: susan: wife [phone] Anticipated Caregiver: wife + hired caregiver Anticipated Ambulance person Information: see above Ability/Limitations of Caregiver: Min A/Mod A, wife has hoyer lift she can utilize  Careers adviser: 24/7 Discharge Plan Discussed with Primary Caregiver: Yes(pt and his wife) Is Caregiver In Agreement with Plan?: Yes Does Caregiver/Family have Issues with Lodging/Transportation while Pt is in Rehab?: No   Goals Patient/Family Goal for Rehab: PT/OT: Mod A; SLP: NA Expected length of stay: 16-20 days Cultural Considerations: NA Pt/Family Agrees to Admission and willing to participate: Yes Program Orientation Provided & Reviewed  with Pt/Caregiver Including Roles  & Responsibilities: Yes(pt and his wife)  Barriers to Discharge: Lack of/limited family support(pt will need to be a +1 assist at times to DC home. )   Decrease burden of Care through IP rehab admission: NA    Possible need for SNF placement upon discharge: Not anticipated; pt has good social support from his wife and access to hired help at [place]. Wife has reasonable goals and already has access to hoyer lift if needed at home.    Patient Condition: I have reviewed medical [facility], spoken with MD, and patient and spouse. I met with patient at the bedside for inpatient rehabilitation assessment.  Patient will benefit from ongoing PT and OT, can actively participate in 3 hours of therapy a day 5 days of the week, and can make measurable gains during the admission.  Patient will also benefit from the coordinated team approach during an Inpatient Acute Rehabilitation admission.  The patient will receive intensive therapy as well as Rehabilitation physician, nursing, social worker, and care management interventions.  Due to safety, skin/wound care, disease management, medication administration, pain management and patient education the patient requires 24 hour a day rehabilitation nursing.  The patient is currently Max A +2 transfers and total A to Max A +2 for basic ADLs.  Discharge setting and therapy post discharge at home with home health is anticipated.  Patient has agreed to participate in the Acute Inpatient Rehabilitation Program and will admit 11/10/19.   Preadmission Screen Completed By:  Raechel Ache, 11/10/2019 3:42 PM ______________________________________________________________________   Discussed status with Dr. Danae Chen on 11/10/19 at 3:42PM and received approval for admission today.   Admission Coordinator:  Raechel Ache, OT, time 3:42PM/Date 11/10/19    Assessment/Plan: Diagnosis:Cervical myelopathy with spastic paraplegia 1. Does the need for close, 24 hr/day Medical supervision in concert with the patient's rehab needs make it unreasonable for this patient to be served in a less intensive setting? Yes 2. Co-Morbidities requiring supervision/potential  complications: Grade 3 sacral decubitus 3. Due to bladder management, bowel management, safety, skin/wound care, disease management, medication administration, pain management and patient education, does the patient require 24 hr/day rehab nursing? Yes 4. Does the patient require coordinated care of a physician, rehab nurse, PT, OT, to address physical and functional deficits in the context of the above medical diagnosis(es)? Yes Addressing deficits in the following areas: balance, endurance, locomotion, strength, transferring, bowel/bladder control, bathing, dressing, toileting and psychosocial support 5. Can the patient actively participate in an intensive therapy program of at least 3 hrs of therapy 5 days a week? Yes 6. The potential for patient to make measurable gains while on inpatient rehab is good 7. Anticipated functional outcomes upon discharge from inpatient rehab: mod assist PT, mod assist OT, n/a SLP 8. Estimated rehab length of stay to reach the above functional goals is: 16-20d 9. Anticipated discharge destination: Home 10. Overall Rehab/Functional Prognosis: good     MD Signature: Charlett Blake M.D. [facility] FAAPM&R (Neuromuscular Med) Diplomate Am Board of Electrodiagnostic Med Fellow Am Board of Interventional Pain            Revision History

## 2019-11-10 NOTE — Progress Notes (Signed)
Physical Therapy Treatment Patient Details Name: [name] MRN: [id number] DOB: [birth year] Today's Date: 11/10/2019    History of Present Illness [name] is a 79 y.o. 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 Patient presented from inpatient rehab for worsening LE weakness and spasticity likely secondary to severe lumbar stenosis.  Pt now s/p MIS laminectomy L3-4 on 11/04/19.  Per neurosurgeon note no activity restrictions.    PT Comments    Pt was agreeable to participation and getting into the chair via scoot transfer, but while sitting EOB working on sitting balance with UE assist and working on transitions. Pt got his go ahead to transfer to CIR and pt decided to go back in bed.  Expect to be a good CIR candidate.     Follow Up Recommendations  CIR     Equipment Recommendations  None recommended by PT    Recommendations for Other Services       Precautions / Restrictions Precautions Precautions: Fall;Back Precaution Comments: L lateral ankle ulcer, L buttock/sacral ulcer    Mobility  Bed Mobility Overal bed mobility: Needs Assistance Bed Mobility: Rolling;Sidelying to Sit;Sit to Sidelying Rolling: Max assist Sidelying to sit: +2 for physical assistance;Mod assist     Sit to sidelying: +2 for physical assistance;Max assist General bed mobility comments: increased time, assist for LEs over EOB, but helped 50% to raise his trunk using UEs on rail, assisted to guide upper body to sidelying and for LEs back into bed, rolled for pericare and sacral dressing change with +1 max assist  Transfers                 General transfer comment: deferred, pt to go to CIR  today  Ambulation/Gait                 Stairs             Wheelchair Mobility    Modified Rankin (Stroke Patients Only)       Balance Overall balance assessment: Needs assistance Sitting-balance support: Feet supported;Single extremity supported;Bilateral upper extremity supported;No upper extremity supported Sitting balance-Leahy Scale: Poor Sitting balance - Comments: pt able to prop on UEs for up to 10 seconds with mattress maximally inflated, sat a total of about 15 minutes                                    Cognition Arousal/Alertness: Awake/alert Behavior During Therapy: WFL for tasks assessed/performed Overall Cognitive Status: Within Functional Limits for tasks assessed                                 General Comments: pt continues to do a great job directing his therapy      Exercises      General Comments        Pertinent Vitals/Pain Pain Assessment: Faces Faces Pain Scale: Hurts even more Pain Location: L lateral ankle, L buttocks/sacrum, B LE spasms with movement Pain Descriptors / Indicators: Grimacing;Spasm;Discomfort;Moaning Pain Intervention(s): Monitored during session    Home Living  Prior Function            PT Goals (current goals can now be found in the care plan section) Acute Rehab PT Goals Patient Stated Goal: get stronger and go home PT Goal Formulation: With patient Time For Goal Achievement: 11/19/19 Potential to Achieve Goals: Fair Progress towards PT goals: Progressing toward goals    Frequency    Min 3X/week      PT Plan Current plan remains appropriate    Co-evaluation PT/OT/SLP Co-Evaluation/Treatment: Yes Reason for Co-Treatment: For patient/therapist safety PT goals addressed during session: Mobility/safety with mobility OT goals addressed during session: Strengthening/ROM      AM-PAC PT "6 Clicks" Mobility   Outcome Measure  Help  needed turning from your back to your side while in a flat bed without using bedrails?: Total Help needed moving from lying on your back to sitting on the side of a flat bed without using bedrails?: Total Help needed moving to and from a bed to a chair (including a wheelchair)?: Total Help needed standing up from a chair using your arms (e.g., wheelchair or bedside chair)?: Total Help needed to walk in hospital room?: Total Help needed climbing 3-5 steps with a railing? : Total 6 Click Score: 6    End of Session   Activity Tolerance: Patient tolerated treatment well Patient left: in bed;with call bell/phone within reach;with bed alarm set Nurse Communication: Mobility status;Need for lift equipment PT Visit Diagnosis: Other abnormalities of gait and mobility (R26.89);Other symptoms and signs involving the nervous system (R29.898)     Time: [id number] PT Time Calculation (min) (ACUTE ONLY): 48 min  Charges:  $Therapeutic Activity: 8-22 mins $Neuromuscular Re-education: 8-22 mins                     11/10/2019  Ginger Carne., PT Acute Rehabilitation Services [phone]  (pager) [phone]  (office)   Tessie Fass Annisten [place] 11/10/2019, 5:42 PM

## 2019-11-10 NOTE — Discharge Summary (Signed)
Physician Discharge Summary  [name] [id number] DOB: 08-04-1940 DOA: 10/28/2019  PCP: [name], MD  Admit date: 10/28/2019 Discharge date: 11/10/2019  Admitted From: CIR  Disposition: CIR  Recommendations for Outpatient Follow-up:  1. Follow up with PCP in 1-2 weeks 2. Please obtain BMP/CBC in one week     Discharge Condition: Guarded.  CODE STATUS:full code.  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 Patient presented from inpatient rehab for worsening LE weakness and spasticity likely secondary to severe lumbar stenosis. Neurosurgery on board.  MRI showed severe spinal stenosis at the level of L3 and L4 patient underwent laminectomy on 11/04/2019 and PT, OT evaluation recommending CIR.  CIR consult in place and TOC aware.  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 S/p surgery with laminectomy on 11/04/2019 Resume Robaxin, baclofen.,  Pain control with Vicodin 1 tab every 6 hours.  Patient reports that his pain is not well controlled and requested to increase the pain medication.  We will increase the Vicodin from 1 to 2 tablets every 6 hours as needed.  Recommend ambulation as  tolerated.   PT OT recommending CIR. CIR on board, currently waiting for insurance authorization and bed availability.   Acute on chronic respiratory failure with hypoxia complicated by left hemidiaphragm paralysis. On 79 yo 5 lit of N C oxygenthis am. Plan to wean him down to 2l it of oxygen.  Continue with incentive spirometry and CPAP at night.    Ascending aortic aneurysm CT angiogram of the chest showed a 4.2 cm ascending thoracic aortic aneurysm Recommend outpatient follow-up with PCP and annual CTA. Continue with simvastatin daily.  Essential hypertension Well controlled.    Hyperlipidemia Continue with simvastatin   History of chronic diastolic heart failure. Appears to be compensated continue with Lasix 40 mg daily and spironolactone 25 mg daily   Restless leg syndrome Continue with home medications.  No changes in his medications.   Hypothyroidism Continue with Synthroid 125 MCG daily.   Obstructive sleep apnea Continue CPAP at bedtime.   Constipation Resume stool softeners.    Myoclonic jerking Continue with Keppra.  Body mass index is 32.73 kg/m. Outpatient follow-up with PCP  BPH continue with Flomax   Leukocytosis Improving Appears to be reactive.   gout flare up of the left foot:  Uric acid level wnl.  Resume colchicine.    Discharge Instructions   Allergies as of 11/10/2019   No Known Allergies     Medication List    STOP taking these medications   oxyCODONE 10 mg 12 hr tablet Commonly known as: OXYCONTIN   rizatriptan 10 MG tablet Commonly known as: MAXALT   simvastatin 40 MG tablet Commonly known as: ZOCOR   spironolactone 25 MG tablet Commonly known as: ALDACTONE     TAKE these medications  acetaminophen 325 MG tablet Commonly known as: TYLENOL Take 2 tablets (650 mg total) by mouth every 6 (six) hours as needed (pain or temperature).   albuterol (2.5 MG/3ML) 0.083% nebulizer  solution Commonly known as: PROVENTIL Inhale 3 mLs (2.5 mg total) into the lungs every 4 (four) hours as needed for wheezing or shortness of breath.   allopurinol 300 MG tablet Commonly known as: ZYLOPRIM Take 300 mg by mouth daily.   aspirin 81 MG EC tablet Take 1 tablet (81 mg total) by mouth daily.   atorvastatin 20 MG tablet Commonly known as: LIPITOR Take 1 tablet (20 mg total) by mouth daily at 6 PM.   baclofen 20 MG tablet Commonly known as: LIORESAL Take 2 tablets (40 mg total) by mouth at bedtime. What changed: Another medication with the same name was changed. Make sure you understand how and when to take each.   baclofen 10 MG tablet Commonly known as: LIORESAL Take 3 tablets (30 mg total) by mouth 3 (three) times daily with meals. What changed:   how much to take  how to take this  when to take this  additional instructions   calcium citrate 950 (200 Ca) MG tablet Commonly known as: CALCITRATE - dosed in mg elemental calcium Take 1 tablet (200 mg of elemental calcium total) by mouth daily.   carbidopa-levodopa 10-100 MG tablet Commonly known as: SINEMET IR Take 1 tablet by mouth 2 (two) times daily.   cholecalciferol 25 MCG (1000 UNIT) tablet Commonly known as: VITAMIN D Take 1,000 Units by mouth daily.   colchicine 0.6 MG tablet Take 0.6 mg by mouth daily.   collagenase ointment Commonly known as: SANTYL Apply topically daily.   docusate sodium 100 MG capsule Commonly known as: COLACE Take 1 capsule (100 mg total) by mouth 2 (two) times daily.   enoxaparin 40 MG/0.4ML injection Commonly known as: LOVENOX Inject 0.4 mLs (40 mg total) into the skin daily.   fluticasone furoate-vilanterol 200-25 MCG/INH Aepb Commonly known as: BREO ELLIPTA Inhale 1 puff into the lungs daily.   furosemide 40 MG tablet Commonly known as: LASIX Take 40 mg by mouth daily.   gabapentin 100 MG capsule Commonly known as: NEURONTIN Take 1 capsule (100 mg total)  by mouth at bedtime.   guaiFENesin 600 MG 12 hr tablet Commonly known as: MUCINEX Take 2 tablets (1,200 mg total) by mouth 2 (two) times daily.   HYDROcodone-acetaminophen 5-325 MG tablet Commonly known as: NORCO/VICODIN Take 1-2 tablets by mouth every 6 (six) hours as needed for moderate pain.   latanoprost 0.005 % ophthalmic solution Commonly known as: XALATAN Place 1 drop into both eyes at bedtime.   levETIRAcetam 250 MG tablet Commonly known as: KEPPRA Take 250 mg by mouth 2 (two) times daily.   levothyroxine 125 MCG tablet Commonly known as: SYNTHROID Take 125 mcg by mouth daily before breakfast.   methocarbamol 750 MG tablet Commonly known as: ROBAXIN Take 1 tablet (750 mg total) by mouth every 6 (six) hours as needed for muscle spasms.   metoprolol succinate 50 MG 24 hr tablet Commonly known as: TOPROL-XL Take 50 mg by mouth daily. Take with or immediately following a meal.   MiraLax 17 GM/SCOOP powder Generic drug: polyethylene glycol powder Take by mouth as directed. 17 gm every other day   multivitamin tablet Take 1 tablet by mouth daily.   nitroGLYCERIN 0.4 MG SL tablet Commonly known as: NITROSTAT Place 0.4 mg every 5 (five) minutes as needed under the  tongue for chest pain.   pantoprazole 40 MG tablet Commonly known as: PROTONIX Take 40 mg by mouth every evening.   senna-docusate 8.6-50 MG tablet Commonly known as: Senokot-S Take 2 tablets by mouth 2 (two) times daily.   SUMAtriptan 50 MG tablet Commonly known as: IMITREX Take 1 tablet (50 mg total) by mouth every 2 (two) hours as needed for migraine. May repeat in 2 hours if headache persists or recurs.   tamsulosin 0.4 MG Caps capsule Commonly known as: FLOMAX Take 2 capsules (0.8 mg total) by mouth daily after supper.   tiZANidine 2 MG tablet Commonly known as: ZANAFLEX Take 1 tablet (2 mg total) by mouth 3 (three) times daily.   traZODone 50 MG tablet Commonly known as: DESYREL Take 1  tablet (50 mg total) by mouth at bedtime as needed for sleep.   Trelegy Ellipta 100-62.5-25 MCG/INH Aepb Generic drug: Fluticasone-Umeclidin-Vilant Inhale 1 puff into the lungs daily.   umeclidinium bromide 62.5 MCG/INH Aepb Commonly known as: INCRUSE ELLIPTA Inhale 1 puff into the lungs daily.      Follow-up Information    [name], MD. Schedule an appointment as soon as possible for a visit in 1 week(s).   Specialty: Internal Medicine Contact information: [place] [address] [phone]        Minus Breeding, MD .   Specialty: Cardiology Contact information: [address] [place] [address] [phone]          No Known Allergies  Consultations:  Neuro surgery.    Procedures/Studies: CT CERVICAL SPINE WO CONTRAST  Result Date: 10/22/2019 CLINICAL DATA:  Worsening spasticity. Spinal stenosis. Consideration of baclofen pump. FLUOROSCOPY TIME:  1.8 minutes.  95.7 mGy air kerma PROCEDURE: LUMBAR PUNCTURE FOR CERVICAL LUMBAR AND THORACIC MYELOGRAM CERVICAL AND LUMBAR AND THORACIC MYELOGRAM CT CERVICAL MYELOGRAM CT LUMBAR MYELOGRAM CT THORACIC MYELOGRAM Written and oral informed consent was obtained. Consent was obtained by Dr. Monte Fantasia. Time-out was performed. Patient was positioned prone on the fluoroscopy table. Local anesthesia was provided with 1% lidocaine without epinephrine after prepped and draped in the usual sterile fashion. Puncture was performed at L5-S1 using a 3 1/2 inch 22-gauge spinal needle via midline approach. Using a single pass through the dura, the needle was placed within the thecal sac, with return of clear CSF. 10 mL Omnipaque 300 was injected into the thecal sac, with normal opacification of the nerve roots and cauda equina consistent with free flow within the subarachnoid space. Patient cannot stand or or easily positioned on the table due to spasms and joint flexion in the lower extremity. Contrast  was advanced into the cervical region in the lateral decubitus position. I personally performed the lumbar puncture and administered the intrathecal contrast. I also personally supervised acquisition of the myelogram images. TECHNIQUE: Contiguous axial images were obtained through the Cervical, Thoracic, and Lumbar spine after the intrathecal infusion of infusion. Coronal and sagittal reconstructions were obtained of the axial image sets. FINDINGS: CERVICAL AND LUMBAR MYELOGRAM FINDINGS: Myelography was only used for positioning of intrathecal contrast, which flowed freely into the upper cervical spine. L3-4 severe disc space degeneration with vacuum phenomenon and disc space obliteration. There is severe stenosis at this level. CT CERVICAL MYELOGRAM FINDINGS: Alignment: Mild anterolisthesis C6-7 Vertebrae: C3-C6 ACDF with solid arthrodesis. Bridging osteophytes ankylosis the C6-7 and C7-T1 spaces. There is also ankylosis across the right facet and intervertebral space of C2-3. Cord: Suboptimal intrathecal contrast which has layered since myelography. Posterior longitudinal  ligament ossification at C5 contacts the ventral cord but no effacement of dorsal CSF. Extraspinal: No evidence of inflammation or mass.  Thyroidectomy. Disc levels: C1-2: Advanced right-sided facet osteoarthritis with joint space narrowing and potential C2 impingement. C2-3: Ankylosis.  No visible impingement C3-4: ACDF. Patent canal. Residual ridging causes moderate left foraminal narrowing. C4-5: ACDF with solid arthrodesis. Patent spinal canal. The foramina appear sufficiently patent C5-6: ACDF. Uncovertebral and facet spurring causes moderate right foraminal narrowing. 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 C6-7: Facet spurring and anterolisthesis. Disc narrowing. Mild bilateral foraminal narrowing. Patent spinal canal C7-T1:Facet spurring and ankylosis. Bridging intervertebral ankylosis.  CT THORACIC MYELOGRAM FINDINGS: Alignment: Exaggerated thoracic kyphosis. Vertebrae: No evidence of fracture or bone lesion. Cord: Suboptimal intrathecal contrast in the upper thoracic levels. No cord compression. Facet spurring in the lower thoracic levels mildly encroaches on the posterolateral cord. Extra-spinal: Left lower lobe and multi segment left upper lobe collapse with large pleural effusion. There is also right-sided atelectasis and volume loss. Cardiomegaly. Disc levels: Diffuse degenerative disc narrowing and ventral spurring. Bridging osteophytes are seen at multiple levels, including T6-7, T8-9, and T10-11. Facet spurs encroach on lower thoracic foramina without focal high-grade involvement CT LUMBAR MYELOGRAM FINDINGS: Segmentation: 5 lumbar type vertebrae. Alignment: Mild dextroscoliosis. Trace retrolisthesis at L2-3 and L3-4. Vertebrae: Extensive vertebral body sclerosis attributed to degenerative disease. No evidence of fracture or bone lesion. Extra-spinal: Extensive atherosclerotic plaque on the aorta. Conus: Tip terminates at L1-2.  No detected swelling Disc levels: T12- L1: Spondylosis.  No impingement L1-L2: Disc narrowing and bulging with endplate ridging. Moderate spinal stenosis. L2-L3: Degenerative disc collapse with endplate sclerosis and posterior ridging. High-grade spinal stenosis. Moderate left more than right foraminal narrowing L3-L4: Severe disc degeneration with disc space obliteration and ridging. Bilateral facet spurring and ligamentum flavum thickening. Advanced spinal stenosis. Right more than left foraminal impingement L4-L5: Advanced disc narrowing with right-sided collapse and endplate ridging. Facet osteoarthritis with bulky spurring on the right where there is ankylosis. Left more than right subarticular recess narrowing. Biforaminal impingement. L5-S1:Disc narrowing and ridging. Right facet osteoarthritis. Moderate right foraminal narrowing. IMPRESSION: Lumbar spine: 1.  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 2. 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 3. L2-3 moderate to advanced spinal stenosis. 4. L1-2 moderate spinal stenosis. 5. Bilateral foraminal narrowings described above. Cervical spine: 1. Resolved cord compression after ACDF. There is solid arthrodesis from C3-C6 and degenerative ankylosis at C2-3, C6-7, and C7-T1. 2. Bilateral foraminal narrowings described above. 3. Large left pleural effusion with collapse of the majority of the left lung. Thoracic spine: 1. Diffuse degenerative disease without cord compression. 2. Multilevel spondylitic ankylosis. Electronically Signed   By: Monte Fantasia M.D.   On: 10/22/2019 09:43   CT THORACIC SPINE WO CONTRAST  Result Date: 10/22/2019 CLINICAL DATA:  Worsening spasticity. Spinal stenosis. Consideration of baclofen pump. FLUOROSCOPY TIME:  1.8 minutes.  95.7 mGy air kerma PROCEDURE: LUMBAR PUNCTURE FOR CERVICAL LUMBAR AND THORACIC MYELOGRAM CERVICAL AND LUMBAR AND THORACIC MYELOGRAM CT CERVICAL MYELOGRAM CT LUMBAR MYELOGRAM CT THORACIC MYELOGRAM Written and oral informed consent was obtained. Consent was obtained by Dr. Monte Fantasia. Time-out was performed. Patient was positioned prone on the fluoroscopy table. Local anesthesia was provided with 1% lidocaine without epinephrine after prepped and draped in the usual sterile fashion. Puncture was performed at L5-S1 using a 3 1/2 inch 22-gauge spinal needle via midline approach. Using a single pass through the dura, the needle was placed within the thecal sac,  with return of clear CSF. 10 mL Omnipaque 300 was injected into the thecal sac, with normal opacification of the nerve roots and cauda equina consistent with free flow within the subarachnoid space. Patient cannot stand or or easily positioned on the table due to spasms and joint flexion in the lower extremity. Contrast was advanced into the cervical region in the lateral decubitus position. I personally  performed the lumbar puncture and administered the intrathecal contrast. I also personally supervised acquisition of the myelogram images. TECHNIQUE: Contiguous axial images were obtained through the Cervical, Thoracic, and Lumbar spine after the intrathecal infusion of infusion. Coronal and sagittal reconstructions were obtained of the axial image sets. FINDINGS: CERVICAL AND LUMBAR MYELOGRAM FINDINGS: Myelography was only used for positioning of intrathecal contrast, which flowed freely into the upper cervical spine. L3-4 severe disc space degeneration with vacuum phenomenon and disc space obliteration. There is severe stenosis at this level. CT CERVICAL MYELOGRAM FINDINGS: Alignment: Mild anterolisthesis C6-7 Vertebrae: C3-C6 ACDF with solid arthrodesis. Bridging osteophytes ankylosis the C6-7 and C7-T1 spaces. There is also ankylosis across the right facet and intervertebral space of C2-3. Cord: Suboptimal intrathecal contrast which has layered since myelography. 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 Extraspinal: No evidence of inflammation or mass.  Thyroidectomy. Disc levels: C1-2: Advanced right-sided facet osteoarthritis with joint space narrowing and potential C2 impingement. C2-3: Ankylosis.  No visible impingement C3-4: ACDF. Patent canal. Residual ridging causes moderate left foraminal narrowing. C4-5: ACDF with solid arthrodesis. Patent spinal canal. The foramina appear sufficiently patent C5-6: ACDF. Uncovertebral and facet spurring causes moderate right foraminal narrowing. 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 C6-7: Facet spurring and anterolisthesis. Disc narrowing. Mild bilateral foraminal narrowing. Patent spinal canal C7-T1:Facet spurring and ankylosis. Bridging intervertebral ankylosis. CT THORACIC MYELOGRAM FINDINGS: Alignment: Exaggerated thoracic kyphosis. Vertebrae:  No evidence of fracture or bone lesion. Cord: Suboptimal intrathecal contrast in the upper thoracic levels. No cord compression. Facet spurring in the lower thoracic levels mildly encroaches on the posterolateral cord. Extra-spinal: Left lower lobe and multi segment left upper lobe collapse with large pleural effusion. There is also right-sided atelectasis and volume loss. Cardiomegaly. Disc levels: Diffuse degenerative disc narrowing and ventral spurring. Bridging osteophytes are seen at multiple levels, including T6-7, T8-9, and T10-11. Facet spurs encroach on lower thoracic foramina without focal high-grade involvement CT LUMBAR MYELOGRAM FINDINGS: Segmentation: 5 lumbar type vertebrae. Alignment: Mild dextroscoliosis. Trace retrolisthesis at L2-3 and L3-4. Vertebrae: Extensive vertebral body sclerosis attributed to degenerative disease. No evidence of fracture or bone lesion. Extra-spinal: Extensive atherosclerotic plaque on the aorta. Conus: Tip terminates at L1-2.  No detected swelling Disc levels: T12- L1: Spondylosis.  No impingement L1-L2: Disc narrowing and bulging with endplate ridging. Moderate spinal stenosis. L2-L3: Degenerative disc collapse with endplate sclerosis and posterior ridging. High-grade spinal stenosis. Moderate left more than right foraminal narrowing L3-L4: Severe disc degeneration with disc space obliteration and ridging. Bilateral facet spurring and ligamentum flavum thickening. Advanced spinal stenosis. Right more than left foraminal impingement L4-L5: Advanced disc narrowing with right-sided collapse and endplate ridging. Facet osteoarthritis with bulky spurring on the right where there is ankylosis. Left more than right subarticular recess narrowing. Biforaminal impingement. L5-S1:Disc narrowing and ridging. Right facet osteoarthritis. Moderate right foraminal narrowing. IMPRESSION: Lumbar spine: 1. 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 2. L3-4 severe spinal  stenosis. 3. L2-3 moderate to advanced spinal stenosis. 4. L1-2 moderate spinal stenosis. 5.  Bilateral foraminal narrowings described above. Cervical spine: 1. Resolved cord compression after ACDF. There is solid arthrodesis from C3-C6 and degenerative ankylosis at C2-3, C6-7, and C7-T1. 2. Bilateral foraminal narrowings described above. 3. Large left pleural effusion with collapse of the majority of the left lung. Thoracic spine: 1. Diffuse degenerative disease without cord compression. 2. Multilevel spondylitic ankylosis. Electronically Signed   By: Monte Fantasia M.D.   On: 10/22/2019 09:43   CT LUMBAR SPINE WO CONTRAST  Result Date: 10/22/2019 CLINICAL DATA:  Worsening spasticity. Spinal stenosis. Consideration of baclofen pump. FLUOROSCOPY TIME:  1.8 minutes.  95.7 mGy air kerma PROCEDURE: LUMBAR PUNCTURE FOR CERVICAL LUMBAR AND THORACIC MYELOGRAM CERVICAL AND LUMBAR AND THORACIC MYELOGRAM CT CERVICAL MYELOGRAM CT LUMBAR MYELOGRAM CT THORACIC MYELOGRAM Written and oral informed consent was obtained. Consent was obtained by Dr. Monte Fantasia. Time-out was performed. Patient was positioned prone on the fluoroscopy table. Local anesthesia was provided with 1% lidocaine without epinephrine after prepped and draped in the usual sterile fashion. Puncture was performed at L5-S1 using a 3 1/2 inch 22-gauge spinal needle via midline approach. Using a single pass through the dura, the needle was placed within the thecal sac, with return of clear CSF. 10 mL Omnipaque 300 was injected into the thecal sac, with normal opacification of the nerve roots and cauda equina consistent with free flow within the subarachnoid space. Patient cannot stand or or easily positioned on the table due to spasms and joint flexion in the lower extremity. Contrast was advanced into the cervical region in the lateral decubitus position. I personally performed the lumbar puncture and administered the intrathecal contrast. I also personally  supervised acquisition of the myelogram images. TECHNIQUE: Contiguous axial images were obtained through the Cervical, Thoracic, and Lumbar spine after the intrathecal infusion of infusion. Coronal and sagittal reconstructions were obtained of the axial image sets. FINDINGS: CERVICAL AND LUMBAR MYELOGRAM FINDINGS: Myelography was only used for positioning of intrathecal contrast, which flowed freely into the upper cervical spine. L3-4 severe disc space degeneration with vacuum phenomenon and disc space obliteration. There is severe stenosis at this level. CT CERVICAL MYELOGRAM FINDINGS: Alignment: Mild anterolisthesis C6-7 Vertebrae: C3-C6 ACDF with solid arthrodesis. Bridging osteophytes ankylosis the C6-7 and C7-T1 spaces. There is also ankylosis across the right facet and intervertebral space of C2-3. Cord: Suboptimal intrathecal contrast which has layered since myelography. 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 Extraspinal: No evidence of inflammation or mass.  Thyroidectomy. Disc levels: C1-2: Advanced right-sided facet osteoarthritis with joint space narrowing and potential C2 impingement. C2-3: Ankylosis.  No visible impingement C3-4: ACDF. Patent canal. Residual ridging causes moderate left foraminal narrowing. C4-5: ACDF with solid arthrodesis. Patent spinal canal. The foramina appear sufficiently patent C5-6: ACDF. Uncovertebral and facet spurring causes moderate right foraminal narrowing. 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 C6-7: Facet spurring and anterolisthesis. Disc narrowing. Mild bilateral foraminal narrowing. Patent spinal canal C7-T1:Facet spurring and ankylosis. Bridging intervertebral ankylosis. CT THORACIC MYELOGRAM FINDINGS: Alignment: Exaggerated thoracic kyphosis. Vertebrae: No evidence of fracture or bone lesion. Cord: Suboptimal intrathecal contrast in the upper  thoracic levels. No cord compression. Facet spurring in the lower thoracic levels mildly encroaches on the posterolateral cord. Extra-spinal: Left lower lobe and multi segment left upper lobe collapse with large pleural effusion. There is also right-sided atelectasis and volume loss. Cardiomegaly. Disc levels: Diffuse degenerative disc narrowing and ventral spurring. Bridging osteophytes are seen at multiple levels,  including T6-7, T8-9, and T10-11. Facet spurs encroach on lower thoracic foramina without focal high-grade involvement CT LUMBAR MYELOGRAM FINDINGS: Segmentation: 5 lumbar type vertebrae. Alignment: Mild dextroscoliosis. Trace retrolisthesis at L2-3 and L3-4. Vertebrae: Extensive vertebral body sclerosis attributed to degenerative disease. No evidence of fracture or bone lesion. Extra-spinal: Extensive atherosclerotic plaque on the aorta. Conus: Tip terminates at L1-2.  No detected swelling Disc levels: T12- L1: Spondylosis.  No impingement L1-L2: Disc narrowing and bulging with endplate ridging. Moderate spinal stenosis. L2-L3: Degenerative disc collapse with endplate sclerosis and posterior ridging. High-grade spinal stenosis. Moderate left more than right foraminal narrowing L3-L4: Severe disc degeneration with disc space obliteration and ridging. Bilateral facet spurring and ligamentum flavum thickening. Advanced spinal stenosis. Right more than left foraminal impingement L4-L5: Advanced disc narrowing with right-sided collapse and endplate ridging. Facet osteoarthritis with bulky spurring on the right where there is ankylosis. Left more than right subarticular recess narrowing. Biforaminal impingement. L5-S1:Disc narrowing and ridging. Right facet osteoarthritis. Moderate right foraminal narrowing. IMPRESSION: Lumbar spine: 1. 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 2. 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 3. L2-3 moderate to advanced spinal stenosis. 4. L1-2 moderate spinal stenosis.  5. Bilateral foraminal narrowings described above. Cervical spine: 1. Resolved cord compression after ACDF. There is solid arthrodesis from C3-C6 and degenerative ankylosis at C2-3, C6-7, and C7-T1. 2. Bilateral foraminal narrowings described above. 3. Large left pleural effusion with collapse of the majority of the left lung. Thoracic spine: 1. Diffuse degenerative disease without cord compression. 2. Multilevel spondylitic ankylosis. Electronically Signed   By: Monte Fantasia M.D.   On: 10/22/2019 09:43   MR THORACIC SPINE W WO CONTRAST  Result Date: 10/16/2019 CLINICAL DATA:  Acute neuro deficit. Spasticity in left leg weakness. EXAM: MRI THORACIC AND LUMBAR SPINE WITHOUT AND WITH CONTRAST TECHNIQUE: Multiplanar and multiecho pulse sequences of the thoracic and lumbar spine were obtained without and with intravenous contrast. CONTRAST:  9.46mL GADAVIST GADOBUTROL 1 MMOL/ML IV SOLN COMPARISON:  MRI thoracic spine 09/17/2019. MRI lumbar spine 09/18/2019 FINDINGS: MRI THORACIC SPINE FINDINGS Alignment:  Mild retro listhesis T1-2.  Remaining alignment normal. Image quality degraded by mild motion however the study is significantly better quality than the prior study. The patient was scanned under general anesthesia. Vertebrae: Mild chronic compression fracture of T6. No acute fracture or mass. No evidence of spinal infection or discitis. 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 There is moderate spinal stenosis at T9-10. No definite cord lesion. Abnormal signal in the lower thoracic cord on the prior study was felt to be artifact. Paraspinal and other soft tissues: Mild to moderate left effusion. Right lower lobe atelectasis or pneumonia. Disc levels: L1-2: Disc degeneration and spurring with mild spinal stenosis and moderate foraminal stenosis bilaterally T2-3: Negative T3-4: Small central disc protrusion. Bilateral facet hypertrophy and mild foraminal stenosis bilaterally T4-5: Mild disc  degeneration with small central disc protrusion. Bilateral facet hypertrophy. Mild foraminal narrowing bilaterally T5-6: Small central disc protrusion with mild facet degeneration. No significant stenosis T6-7: Mild disc and facet degeneration without stenosis T7-8: Mild disc and facet degeneration. Small central disc protrusion. Negative for stenosis. T8-9: Small central disc protrusion with mild facet degeneration. Negative for spinal or foraminal stenosis. T9-10: Disc degeneration with diffuse endplate spurring and bilateral facet hypertrophy. Mild to moderate spinal stenosis and mild foraminal stenosis bilaterally. T10-11: Mild disc and facet degeneration. Mild foraminal narrowing bilaterally T11-12: Right-sided disc protrusion with cord flattening on the right and mild spinal  stenosis. Mild foraminal narrowing bilaterally. Bilateral facet degeneration T12-L1: Mild right-sided disc protrusion with mild right foraminal stenosis. MRI LUMBAR SPINE FINDINGS Segmentation:  Normal Alignment: Mild retrolisthesis L2-3 and L3-4. Mild anterolisthesis L4-5. Vertebrae: Negative for fracture or mass. Discogenic sclerosis and edema in the bone marrow at L3-4. Fatty changes in the bone marrow at L4-5. No evidence of spinal infection. Conus medullaris: Extends to the L1-2 level and appears normal. Paraspinal and other soft tissues: Negative for paraspinous mass adenopathy or fluid collection. Disc levels: L1-2: Broad-based central disc protrusion. Bilateral facet hypertrophy. Mild to moderate spinal stenosis. L2-3: Advanced disc degeneration with marked disc space narrowing and endplate spurring as well as endplate fatty changes on the left. Moderate facet hypertrophy. Mild to moderate spinal stenosis and moderate subarticular stenosis bilaterally. L3-4: Advanced disc degeneration with diffuse endplate spurring and moderate facet hypertrophy. Severe spinal stenosis and severe subarticular stenosis bilaterally. Severe right  foraminal stenosis. L4-5: Severe disc degeneration with diffuse endplate spurring. Severe facet degeneration. Mild spinal stenosis. Severe subarticular stenosis on the right and moderate subarticular stenosis on the left L5-S1: Disc degeneration with endplate spurring. Right foraminal disc protrusion with displacement of the right L5 nerve root lateral to the foramen, unchanged from the prior study. Marked facet degeneration bilaterally with moderate subarticular stenosis bilaterally. IMPRESSION: 1. 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 No other cord lesion identified in the thoracic spine. Cord signal on the prior study felt to be due to motion artifact. 2. Multilevel degenerative change throughout the thoracic spine with disc and facet degeneration multiple levels. No cord compression. 3. 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 No interval change from prior study. Electronically Signed   By: Franchot Gallo M.D.   On: 10/16/2019 19:38   MR Lumbar Spine W Wo Contrast  Result Date: 10/16/2019 CLINICAL DATA:  Acute neuro deficit. Spasticity in left leg weakness. EXAM: MRI THORACIC AND LUMBAR SPINE WITHOUT AND WITH CONTRAST TECHNIQUE: Multiplanar and multiecho pulse sequences of the thoracic and lumbar spine were obtained without and with intravenous contrast. CONTRAST:  9.84mL GADAVIST GADOBUTROL 1 MMOL/ML IV SOLN COMPARISON:  MRI thoracic spine 09/17/2019. MRI lumbar spine 09/18/2019 FINDINGS: MRI THORACIC SPINE FINDINGS Alignment:  Mild retro listhesis T1-2.  Remaining alignment normal. Image quality degraded by mild motion however the study is significantly better quality than the prior study. The patient was scanned under general anesthesia. Vertebrae: Mild chronic compression fracture of T6. No acute fracture or mass. No evidence of spinal infection or discitis. Cord: Small 1 mm syrinx at T8-T9 without  significant mass. There is moderate spinal stenosis at T9-10. No definite cord lesion. Abnormal signal in the lower thoracic cord on the prior study was felt to be artifact. Paraspinal and other soft tissues: Mild to moderate left effusion. Right lower lobe atelectasis or pneumonia. Disc levels: L1-2: Disc degeneration and spurring with mild spinal stenosis and moderate foraminal stenosis bilaterally T2-3: Negative T3-4: Small central disc protrusion. Bilateral facet hypertrophy and mild foraminal stenosis bilaterally T4-5: Mild disc degeneration with small central disc protrusion. Bilateral facet hypertrophy. Mild foraminal narrowing bilaterally T5-6: Small central disc protrusion with mild facet degeneration. No significant stenosis T6-7: Mild disc and facet degeneration without stenosis T7-8: Mild disc and facet degeneration. Small central disc protrusion. Negative for stenosis. T8-9: Small central disc protrusion with mild facet degeneration. Negative for spinal or foraminal stenosis. T9-10: Disc degeneration with diffuse endplate spurring and bilateral facet  hypertrophy. Mild to moderate spinal stenosis and mild foraminal stenosis bilaterally. T10-11: Mild disc and facet degeneration. Mild foraminal narrowing bilaterally T11-12: Right-sided disc protrusion with cord flattening on the right and mild spinal stenosis. Mild foraminal narrowing bilaterally. Bilateral facet degeneration T12-L1: Mild right-sided disc protrusion with mild right foraminal stenosis. MRI LUMBAR SPINE FINDINGS Segmentation:  Normal Alignment: Mild retrolisthesis L2-3 and L3-4. Mild anterolisthesis L4-5. Vertebrae: Negative for fracture or mass. Discogenic sclerosis and edema in the bone marrow at L3-4. Fatty changes in the bone marrow at L4-5. No evidence of spinal infection. Conus medullaris: Extends to the L1-2 level and appears normal. Paraspinal and other soft tissues: Negative for paraspinous mass adenopathy or fluid collection. Disc  levels: L1-2: Broad-based central disc protrusion. Bilateral facet hypertrophy. Mild to moderate spinal stenosis. L2-3: Advanced disc degeneration with marked disc space narrowing and endplate spurring as well as endplate fatty changes on the left. Moderate facet hypertrophy. Mild to moderate spinal stenosis and moderate subarticular stenosis bilaterally. L3-4: Advanced disc degeneration with diffuse endplate spurring and moderate facet hypertrophy. Severe spinal stenosis and severe subarticular stenosis bilaterally. Severe right foraminal stenosis. L4-5: Severe disc degeneration with diffuse endplate spurring. Severe facet degeneration. Mild spinal stenosis. Severe subarticular stenosis on the right and moderate subarticular stenosis on the left L5-S1: Disc degeneration with endplate spurring. Right foraminal disc protrusion with displacement of the right L5 nerve root lateral to the foramen, unchanged from the prior study. Marked facet degeneration bilaterally with moderate subarticular stenosis bilaterally. IMPRESSION: 1. 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 No other cord lesion identified in the thoracic spine. Cord signal on the prior study felt to be due to motion artifact. 2. Multilevel degenerative change throughout the thoracic spine with disc and facet degeneration multiple levels. No cord compression. 3. 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 No interval change from prior study. Electronically Signed   By: Franchot Gallo M.D.   On: 10/16/2019 19:38   DG Lumbar Spine 1 View  Result Date: 11/04/2019 CLINICAL DATA:  Lumbar surgery. EXAM: LUMBAR SPINE - 1 VIEW COMPARISON:  CT 10/22/2019. FINDINGS: Lumbar spine numbered the lowest segmented appearing lumbar shaped vertebrae on lateral view as L5. Surgical instrument is noted posteriorly at the level of L4. IMPRESSION: Postsurgical changes lumbar spine.  Electronically Signed   By: Marcello Moores  Register   On: 11/04/2019 15:06   CT ANKLE LEFT WO CONTRAST  Result Date: 10/23/2019 CLINICAL DATA:  Soft tissue wound of the lateral aspect of the left ankle at the lateral malleolus. EXAM: CT OF THE LEFT ANKLE WITHOUT CONTRAST TECHNIQUE: Multidetector CT imaging of the left ankle was performed according to the standard protocol. Multiplanar CT image reconstructions were also generated. COMPARISON:  None. FINDINGS: Bones/Joint/Cartilage There is no evidence of osteomyelitis, fracture, or other acute abnormality of the bones of the ankle. Tiny marginal osteophytes on the distal tibia. Minimal cystic degenerative changes talus at the posterior facet of the subtalar joint. Small intraosseous ganglion cyst in the calcaneus. No ankle or subtalar joint effusions. Ligaments Suboptimally assessed by CT. Not well enough seen for assessment. Muscles and Tendons The tendons around the ankle demonstrate no significant abnormalities. No discrete muscle abnormality. Soft tissues Subcutaneous edema around the ankle, most prominent over the lateral malleolus where there is a superficial soft tissue ulceration. No visible abscess. IMPRESSION: 1. No evidence of osteomyelitis, fracture, or other acute abnormality of the bones of the ankle. 2.  Subcutaneous edema around the ankle, most prominent over the lateral malleolus where there is a superficial soft tissue ulceration. 3. No visible abscess. Electronically Signed   By: Lorriane Shire M.D.   On: 10/23/2019 16:26   DG Sniff Test  Result Date: 10/14/2019 CLINICAL DATA:  Evaluate left diaphragm function. Question pneumonia EXAM: CHEST FLUOROSCOPY TECHNIQUE: Real-time fluoroscopic evaluation of the chest was performed. FLUOROSCOPY TIME:  Fluoroscopy Time:  1 minutes 18 seconds Radiation Exposure Index (if provided by the fluoroscopic device): Number of Acquired Spot Images: 0 COMPARISON:  Chest today FINDINGS: Normal motion of the right  hemidiaphragm with inspiration and sniffing. Paradoxical elevation left hemidiaphragm during sniffing compatible with paresis. There is left lower lobe consolidation and left effusion which is layering. 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 Layering left effusion and left lower lobe consolidation. Normal movement of right diaphragm. Electronically Signed   By: Franchot Gallo M.D.   On: 10/14/2019 16:59   DG CHEST PORT 1 VIEW  Result Date: 10/29/2019 CLINICAL DATA:  Acute on chronic respiratory failure with hypoxia. EXAM: PORTABLE CHEST 1 VIEW COMPARISON:  Oct 27, 2019. FINDINGS: Stable cardiomediastinal silhouette. No pneumothorax is noted. Stable left pleural effusion is noted with associated atelectasis or infiltrate. Mild right basilar subsegmental atelectasis is noted. Bony thorax is unremarkable. IMPRESSION: Stable left pleural effusion is noted with associated atelectasis or infiltrate. Mild right basilar subsegmental atelectasis is noted. Electronically Signed   By: Marijo Conception M.D.   On: 10/29/2019 12:28   DG CHEST PORT 1 VIEW  Result Date: 10/27/2019 CLINICAL DATA:  Increasing shortness of breath. EXAM: PORTABLE CHEST 1 VIEW COMPARISON:  Full-thickness ray dated Oct 24, 2019. FINDINGS: Stable cardiomediastinal silhouette. Normal pulmonary vascularity. Unchanged small to moderate left pleural effusion with left basilar atelectasis. No pneumothorax. Unchanged elevation of left hemidiaphragm. No acute osseous abnormality. IMPRESSION: 1. Unchanged left pleural effusion with associated basilar atelectasis. Electronically Signed   By: Titus Dubin M.D.   On: 10/27/2019 10:32   DG CHEST PORT 1 VIEW  Result Date: 10/24/2019 CLINICAL DATA:  Wheezing, cough EXAM: PORTABLE CHEST 1 VIEW COMPARISON:  10/14/2019 FINDINGS: Persistent left pleural effusion with associated atelectasis. Improved aeration at the right lung base. Similar cardiomediastinal  contours. Surgical clips overlie the neck. Anterior cervical fusion is present. Right rotator cuff repair. IMPRESSION: Persistent left pleural effusion with associated atelectasis. Electronically Signed   By: Macy Mis M.D.   On: 10/24/2019 09:21   DG Chest Port 1 View  Result Date: 10/14/2019 CLINICAL DATA:  Shortness of breath, cough. EXAM: PORTABLE CHEST 1 VIEW COMPARISON:  October 07, 2019. FINDINGS: Stable cardiomediastinal silhouette. No pneumothorax is noted. Minimal right basilar subsegmental atelectasis is noted. Moderate left pleural effusion is noted with associated left basilar atelectasis or infiltrate. Bony thorax is unremarkable. IMPRESSION: Moderate left pleural effusion is noted with associated left basilar atelectasis or infiltrate. Minimal right basilar subsegmental atelectasis is noted. Electronically Signed   By: Marijo Conception M.D.   On: 10/14/2019 12:55   DG C-Arm 1-60 Min  Result Date: 11/04/2019 CLINICAL DATA:  L3-L4 laminectomy. EXAM: DG C-ARM 1-60 MIN CONTRAST:  None. FLUOROSCOPY TIME:  Fluoroscopy Time:  0 minutes 9 seconds Number of Acquired Spot Images: 1 COMPARISON:  CT lumbar spine 10/22/2019. FINDINGS: Lumbar spine numbered the lowest segmented appearing lumbar shaped vertebrae on lateral view as L5. Metallic surgical instrument noted posterior to the level of L4. IMPRESSION: Postsurgical changes lumbar spine. Electronically Signed  By: [place]   On: 11/04/2019 15:34   DG MYELOGRAPHY LUMBAR INJ MULTI REGION  Result Date: 10/22/2019 CLINICAL DATA:  Worsening spasticity. Spinal stenosis. Consideration of baclofen pump. FLUOROSCOPY TIME:  1.8 minutes.  95.7 mGy air kerma PROCEDURE: LUMBAR PUNCTURE FOR CERVICAL LUMBAR AND THORACIC MYELOGRAM CERVICAL AND LUMBAR AND THORACIC MYELOGRAM CT CERVICAL MYELOGRAM CT LUMBAR MYELOGRAM CT THORACIC MYELOGRAM Written and oral informed consent was obtained. Consent was obtained by Dr. Monte Fantasia. Time-out was performed.  Patient was positioned prone on the fluoroscopy table. Local anesthesia was provided with 1% lidocaine without epinephrine after prepped and draped in the usual sterile fashion. Puncture was performed at L5-S1 using a 3 1/2 inch 22-gauge spinal needle via midline approach. Using a single pass through the dura, the needle was placed within the thecal sac, with return of clear CSF. 10 mL Omnipaque 300 was injected into the thecal sac, with normal opacification of the nerve roots and cauda equina consistent with free flow within the subarachnoid space. Patient cannot stand or or easily positioned on the table due to spasms and joint flexion in the lower extremity. Contrast was advanced into the cervical region in the lateral decubitus position. I personally performed the lumbar puncture and administered the intrathecal contrast. I also personally supervised acquisition of the myelogram images. TECHNIQUE: Contiguous axial images were obtained through the Cervical, Thoracic, and Lumbar spine after the intrathecal infusion of infusion. Coronal and sagittal reconstructions were obtained of the axial image sets. FINDINGS: CERVICAL AND LUMBAR MYELOGRAM FINDINGS: Myelography was only used for positioning of intrathecal contrast, which flowed freely into the upper cervical spine. L3-4 severe disc space degeneration with vacuum phenomenon and disc space obliteration. There is severe stenosis at this level. CT CERVICAL MYELOGRAM FINDINGS: Alignment: Mild anterolisthesis C6-7 Vertebrae: C3-C6 ACDF with solid arthrodesis. Bridging osteophytes ankylosis the C6-7 and C7-T1 spaces. There is also ankylosis across the right facet and intervertebral space of C2-3. Cord: Suboptimal intrathecal contrast which has layered since myelography. 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 Extraspinal: No evidence of inflammation or mass.  Thyroidectomy. Disc levels: C1-2:  Advanced right-sided facet osteoarthritis with joint space narrowing and potential C2 impingement. C2-3: Ankylosis.  No visible impingement C3-4: ACDF. Patent canal. Residual ridging causes moderate left foraminal narrowing. C4-5: ACDF with solid arthrodesis. Patent spinal canal. The foramina appear sufficiently patent C5-6: ACDF. Uncovertebral and facet spurring causes moderate right foraminal narrowing. 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 C6-7: Facet spurring and anterolisthesis. Disc narrowing. Mild bilateral foraminal narrowing. Patent spinal canal C7-T1:Facet spurring and ankylosis. Bridging intervertebral ankylosis. CT THORACIC MYELOGRAM FINDINGS: Alignment: Exaggerated thoracic kyphosis. Vertebrae: No evidence of fracture or bone lesion. Cord: Suboptimal intrathecal contrast in the upper thoracic levels. No cord compression. Facet spurring in the lower thoracic levels mildly encroaches on the posterolateral cord. Extra-spinal: Left lower lobe and multi segment left upper lobe collapse with large pleural effusion. There is also right-sided atelectasis and volume loss. Cardiomegaly. Disc levels: Diffuse degenerative disc narrowing and ventral spurring. Bridging osteophytes are seen at multiple levels, including T6-7, T8-9, and T10-11. Facet spurs encroach on lower thoracic foramina without focal high-grade involvement CT LUMBAR MYELOGRAM FINDINGS: Segmentation: 5 lumbar type vertebrae. Alignment: Mild dextroscoliosis. Trace retrolisthesis at L2-3 and L3-4. Vertebrae: Extensive vertebral body sclerosis attributed to degenerative disease. No evidence of fracture or bone lesion. Extra-spinal: Extensive atherosclerotic plaque on the aorta. Conus: Tip terminates at L1-2.  No  detected swelling Disc levels: T12- L1: Spondylosis.  No impingement L1-L2: Disc narrowing and bulging with endplate ridging. Moderate spinal stenosis. L2-L3: Degenerative disc collapse  with endplate sclerosis and posterior ridging. High-grade spinal stenosis. Moderate left more than right foraminal narrowing L3-L4: Severe disc degeneration with disc space obliteration and ridging. Bilateral facet spurring and ligamentum flavum thickening. Advanced spinal stenosis. Right more than left foraminal impingement L4-L5: Advanced disc narrowing with right-sided collapse and endplate ridging. Facet osteoarthritis with bulky spurring on the right where there is ankylosis. Left more than right subarticular recess narrowing. Biforaminal impingement. L5-S1:Disc narrowing and ridging. Right facet osteoarthritis. Moderate right foraminal narrowing. IMPRESSION: Lumbar spine: 1. 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 2. 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 3. L2-3 moderate to advanced spinal stenosis. 4. L1-2 moderate spinal stenosis. 5. Bilateral foraminal narrowings described above. Cervical spine: 1. Resolved cord compression after ACDF. There is solid arthrodesis from C3-C6 and degenerative ankylosis at C2-3, C6-7, and C7-T1. 2. Bilateral foraminal narrowings described above. 3. Large left pleural effusion with collapse of the majority of the left lung. Thoracic spine: 1. Diffuse degenerative disease without cord compression. 2. Multilevel spondylitic ankylosis. Electronically Signed   By: Monte Fantasia M.D.   On: 10/22/2019 09:43       Subjective: Wants to go to [facility].   Discharge Exam: Vitals:   11/09/19 2241 11/10/19 0632  BP:  (!) 143/81  Pulse:  78  Resp: 19 18  Temp:  97.9 F (36.6 C)  SpO2: 92% 93%   Vitals:   11/09/19 2100 11/09/19 2214 11/09/19 2241 11/10/19 0632  BP: 111/67   (!) 143/81  Pulse: 75 78  78  Resp: 20 16 19 18   Temp: 98.1 F (36.7 C)   97.9 F (36.6 C)  TempSrc:    Oral  SpO2: (!) 89% 93% 92% 93%  Weight:      Height:        General: Pt is alert, awake, not in acute distress Cardiovascular: RRR, S1/S2 +, no rubs, no gallops Respiratory:  CTA bilaterally, no wheezing, no rhonchi Abdominal: Soft, NT, ND, bowel sounds + Extremities: no edema, no cyanosis    The results of significant diagnostics from this hospitalization (including imaging, microbiology, ancillary and laboratory) are listed below for reference.     Microbiology: No results found for this or any previous visit (from the past 240 hour(s)).   Labs: BNP (last 3 results) Recent Labs    10/03/19 0557  BNP [id number]   Basic Metabolic Panel: Recent Labs  Lab 11/04/19 0239 11/05/19 0243 11/08/19 1559  NA 141 142 141  K 4.0 4.5 4.2  CL 98 100 98  CO2 34* 29 33*  GLUCOSE 105* 150* 131*  BUN 21 22 24*  CREATININE 0.89 0.79 0.79  CALCIUM 9.0 9.2 9.0   Liver Function Tests: Recent Labs  Lab 11/05/19 0243 11/08/19 1559  AST 18 17  ALT 13 16  ALKPHOS 75 67  BILITOT 0.4 0.6  PROT 6.8 6.2*  ALBUMIN 3.5 3.1*   No results for input(s): LIPASE, AMYLASE in the last 168 hours. No results for input(s): AMMONIA in the last 168 hours. CBC: Recent Labs  Lab 11/04/19 0239 11/05/19 0243 11/08/19 1559  WBC 18.5* 13.9* 12.1*  NEUTROABS  --  12.1* 9.0*  HGB 12.2* 12.3* 11.5*  HCT 39.1 39.0 36.8*  MCV 91.1 90.5 90.4  PLT 210 204 215   Cardiac Enzymes: No results for input(s): CKTOTAL, CKMB, CKMBINDEX, TROPONINI in the  last 168 hours. BNP: Invalid input(s): POCBNP CBG: Recent Labs  Lab 11/09/19 0804 11/09/19 1209 11/09/19 1626 11/09/19 1940 11/09/19 2302  GLUCAP 93 120* 113* 111* 120*   D-Dimer No results for input(s): DDIMER in the last 72 hours. Hgb A1c No results for input(s): HGBA1C in the last 72 hours. Lipid Profile No results for input(s): CHOL, HDL, LDLCALC, TRIG, CHOLHDL, LDLDIRECT in the last 72 hours. Thyroid function studies No results for input(s): TSH, T4TOTAL, T3FREE, THYROIDAB in the last 72 hours.  Invalid input(s): FREET3 Anemia work up No results for input(s): VITAMINB12, FOLATE, FERRITIN, TIBC, IRON, RETICCTPCT in the  last 72 hours. Urinalysis    Component Value Date/Time   COLORURINE YELLOW 10/28/2019 1330   APPEARANCEUR HAZY (A) 10/28/2019 1330   LABSPEC 1.029 10/28/2019 1330   PHURINE 5.0 10/28/2019 1330   GLUCOSEU NEGATIVE 10/28/2019 1330   HGBUR NEGATIVE 10/28/2019 1330   BILIRUBINUR NEGATIVE 10/28/2019 1330   KETONESUR 5 (A) 10/28/2019 1330   PROTEINUR 30 (A) 10/28/2019 1330   UROBILINOGEN 1.0 09/09/2010 1045   NITRITE NEGATIVE 10/28/2019 1330   LEUKOCYTESUR NEGATIVE 10/28/2019 1330   Sepsis Labs Invalid input(s): PROCALCITONIN,  WBC,  LACTICIDVEN Microbiology No results found for this or any previous visit (from the past 240 hour(s)).   Time coordinating discharge: 32 minutes.   SIGNED:   Hosie Poisson, MD  [facility] 11/10/2019, 9:24 AM

## 2019-11-10 NOTE — Progress Notes (Signed)
Neurosurgery Service Progress Note  Subjective: NAE ON, having severe leg spasms, o/w no complaints, minimal back pain  Objective: Vitals:   11/09/19 2100 11/09/19 2214 11/09/19 2241 11/10/19 0632  BP: 111/67   (!) 143/81  Pulse: 75 78  78  Resp: 20 16 19 18   Temp: 98.1 F (36.7 C)   97.9 F (36.6 C)  TempSrc:    Oral  SpO2: (!) 89% 93% 92% 93%  Weight:      Height:       Temp (24hrs), Avg:98.1 F (36.7 C), Min:97.8 F (36.6 C), Max:98.5 F (36.9 C)  CBC Latest Ref Rng & Units 11/08/2019 11/05/2019 11/04/2019  WBC 4.0 - 10.5 K/uL 12.1(H) 13.9(H) 18.5(H)  Hemoglobin 13.0 - 17.0 g/dL 11.5(L) 12.3(L) 12.2(L)  Hematocrit 39.0 - 52.0 % 36.8(L) 39.0 39.1  Platelets 150 - 400 K/uL 215 204 210   BMP Latest Ref Rng & Units 11/08/2019 11/05/2019 11/04/2019  Glucose 70 - 99 mg/dL 131(H) 150(H) 105(H)  BUN 8 - 23 mg/dL 24(H) 22 21  Creatinine 0.61 - 1.24 mg/dL 0.79 0.79 0.89  BUN/Creat Ratio 10 - 24 - - -  Sodium 135 - 145 mmol/L 141 142 141  Potassium 3.5 - 5.1 mmol/L 4.2 4.5 4.0  Chloride 98 - 111 mmol/L 98 100 98  CO2 22 - 32 mmol/L 33(H) 29 34(H)  Calcium 8.9 - 10.3 mg/dL 9.0 9.2 9.0    Intake/Output Summary (Last 24 hours) at 11/10/2019 0732 Last data filed at 11/09/2019 1505 Gross per 24 hour  Intake --  Output 850 ml  Net -850 ml    Current Facility-Administered Medications:    acetaminophen (TYLENOL) tablet 650 mg, 650 mg, Oral, Q6H PRN, Tamala Julian, Rondell A, MD   albuterol (PROVENTIL) (2.5 MG/3ML) 0.083% nebulizer solution 2.5 mg, 2.5 mg, Nebulization, Q6H PRN, Tamala Julian, Rondell A, MD   allopurinol (ZYLOPRIM) tablet 300 mg, 300 mg, Oral, Daily, Tamala Julian, Rondell A, MD, 300 mg at 11/09/19 [id number]   aspirin EC tablet 81 mg, 81 mg, Oral, Daily, Tamala Julian, Rondell A, MD, 81 mg at 11/09/19 0953   atorvastatin (LIPITOR) tablet 20 mg, 20 mg, Oral, q1800, Mariel Aloe, MD, 20 mg at 11/09/19 1714   baclofen (LIORESAL) tablet 30 mg, 30 mg, Oral, TID WC, Smith, Rondell A, MD, 30 mg at 11/10/19  [id number]   baclofen (LIORESAL) tablet 40 mg, 40 mg, Oral, QHS, Smith, Rondell A, MD, 40 mg at 11/09/19 2303   calcium carbonate (TUMS - dosed in mg elemental calcium) chewable tablet 200 mg of elemental calcium, 1 tablet, Oral, PRN, Hosie Poisson, MD, 200 mg of elemental calcium at 11/05/19 2334   calcium citrate (CALCITRATE - dosed in mg elemental calcium) tablet 200 mg of elemental calcium, 200 mg of elemental calcium, Oral, Daily, Smith, Rondell A, MD, 200 mg of elemental calcium at 11/09/19 0952   carbidopa-levodopa (SINEMET IR) 10-100 MG per tablet immediate release 1 tablet, 1 tablet, Oral, BID, Tamala Julian, Rondell A, MD, 1 tablet at 11/10/19 [id number]   Chlorhexidine Gluconate Cloth 2 % PADS 6 each, 6 each, Topical, Daily, Mariel Aloe, MD, 6 each at 11/07/19 1215   collagenase (SANTYL) ointment, , Topical, Daily, Fuller Plan A, MD, Given at 11/09/19 1716   docusate sodium (COLACE) capsule 100 mg, 100 mg, Oral, BID, Karleen Hampshire, Vijaya, MD, 100 mg at 11/09/19 2258   fluticasone furoate-vilanterol (BREO ELLIPTA) 200-25 MCG/INH 1 puff, 1 puff, Inhalation, Daily, Fuller Plan A, MD, 1 puff at 11/09/19 0808   furosemide (LASIX) tablet  40 mg, 40 mg, Oral, Daily, Mariel Aloe, MD, 40 mg at 11/09/19 [id number]   gabapentin (NEURONTIN) capsule 100 mg, 100 mg, Oral, QHS, Smith, Rondell A, MD, 100 mg at 11/09/19 2257   guaiFENesin (MUCINEX) 12 hr tablet 1,200 mg, 1,200 mg, Oral, BID, Smith, Rondell A, MD, 1,200 mg at 11/09/19 2258   heparin injection 5,000 Units, 5,000 Units, Subcutaneous, Q8H, Hosie Poisson, MD, 5,000 Units at 11/10/19 0636   HYDROcodone-acetaminophen (NORCO/VICODIN) 5-325 MG per tablet 1-2 tablet, 1-2 tablet, Oral, Q6H PRN, Hosie Poisson, MD, 1 tablet at 11/10/19 [id number]   ipratropium-albuterol (DUONEB) 0.5-2.5 (3) MG/3ML nebulizer solution 3 mL, 3 mL, Nebulization, Q4H PRN, Tamala Julian, Rondell A, MD   labetalol (NORMODYNE) injection 10 mg, 10 mg, Intravenous, Q2H PRN, Opyd, Ilene Qua, MD   lactated ringers  infusion, , Intravenous, Continuous, Oleta Mouse, MD, Last Rate: 10 mL/hr at 11/07/19 0807, IV Pump Association at 11/07/19 0807   latanoprost (XALATAN) 0.005 % ophthalmic solution 1 drop, 1 drop, Both Eyes, QHS, Smith, Rondell A, MD, 1 drop at 11/09/19 2252   levETIRAcetam (KEPPRA) tablet 250 mg, 250 mg, Oral, BID, Tamala Julian, Rondell A, MD, 250 mg at 11/09/19 2254   levothyroxine (SYNTHROID) tablet 125 mcg, 125 mcg, Oral, QAC breakfast, Opyd, Ilene Qua, MD, 125 mcg at 11/10/19 0636   methocarbamol (ROBAXIN) 500 mg in dextrose 5 % 50 mL IVPB, 500 mg, Intravenous, Once, Mariel Aloe, MD   methocarbamol (ROBAXIN) tablet 750 mg, 750 mg, Oral, Q6H PRN, Hosie Poisson, MD, 750 mg at 11/07/19 2339   metoprolol succinate (TOPROL-XL) 24 hr tablet 50 mg, 50 mg, Oral, Daily, Mariel Aloe, MD, 50 mg at 11/09/19 [id number]   multivitamin with minerals tablet 1 tablet, 1 tablet, Oral, Daily, Fuller Plan A, MD, 1 tablet at 11/09/19 0953   nitroGLYCERIN (NITROSTAT) SL tablet 0.4 mg, 0.4 mg, Sublingual, Q5 min PRN, Smith, Rondell A, MD   ondansetron (ZOFRAN) tablet 4 mg, 4 mg, Oral, Q6H PRN **OR** ondansetron (ZOFRAN) injection 4 mg, 4 mg, Intravenous, Q6H PRN, Tamala Julian, Rondell A, MD, 4 mg at 11/04/19 2310   pantoprazole (PROTONIX) EC tablet 40 mg, 40 mg, Oral, QPM, Smith, Rondell A, MD, 40 mg at 11/09/19 1716   polyethylene glycol (MIRALAX / GLYCOLAX) packet 17 g, 17 g, Oral, Daily, Mariel Aloe, MD, 17 g at 11/09/19 [id number]   senna-docusate (Senokot-S) tablet 2 tablet, 2 tablet, Oral, BID, Hosie Poisson, MD, 2 tablet at 11/09/19 2254   sodium chloride flush (NS) 0.9 % injection 3 mL, 3 mL, Intravenous, Q12H, Smith, Rondell A, MD, 3 mL at 11/09/19 2252   spironolactone (ALDACTONE) tablet 25 mg, 25 mg, Oral, Daily, Mariel Aloe, MD, 25 mg at 11/09/19 0953   SUMAtriptan (IMITREX) tablet 50 mg, 50 mg, Oral, Q2H PRN, Tamala Julian, Rondell A, MD, 50 mg at 11/06/19 1316   tamsulosin (FLOMAX) capsule 0.8 mg, 0.8 mg, Oral,  QPC supper, Smith, Rondell A, MD, 0.8 mg at 11/09/19 1715   tiZANidine (ZANAFLEX) tablet 2 mg, 2 mg, Oral, TID, Tamala Julian, Rondell A, MD, 2 mg at 11/09/19 2257   traZODone (DESYREL) tablet 50 mg, 50 mg, Oral, QHS PRN, Tamala Julian, Rondell A, MD, 50 mg at 10/30/19 2317   umeclidinium bromide (INCRUSE ELLIPTA) 62.5 MCG/INH 1 puff, 1 puff, Inhalation, Daily, Mariel Aloe, MD, 1 puff at 11/05/19 0741   Physical Exam: Exam deferred 2/2 significant BLE spasms  Assessment & Plan: 79 y.o. 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 5/18 s/p L3-4 MIS laminectomy  -no change in neurosurgical plan of care  Judith Part  11/10/19 7:32 AM

## 2019-11-10 NOTE — Progress Notes (Signed)
Occupational Therapy Treatment Patient Details Name: [name] MRN: [id number] DOB: [birth year] Today's Date: 11/10/2019    History of present illness [name] is a 79 y.o. 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 Patient presented from inpatient rehab for worsening LE weakness and spasticity likely secondary to severe lumbar stenosis.  Pt now s/p MIS laminectomy L3-4 on 11/04/19.  Per neurosurgeon note no activity restrictions.   OT comments  Pt continues to be highly motivated, but LE spasms/pain interfering with bed mobility. Focus of session on bed mobility using log roll technique and sitting balance at EOB. Did not progress OOB this visit as pt is to discharge to CIR today.  Follow Up Recommendations  CIR;Supervision/Assistance - 24 hour    Equipment Recommendations  None recommended by OT    Recommendations for Other Services      Precautions / Restrictions Precautions Precautions: Fall;Back Precaution Comments: L lateral ankle ulcer, L buttock/sacral ulcer       Mobility Bed Mobility Overal bed mobility: Needs Assistance Bed Mobility: Rolling;Sidelying to Sit;Sit to Sidelying Rolling: Max assist Sidelying to sit: +2 for physical assistance;Mod assist     Sit to sidelying: +2 for physical assistance;Max assist General bed mobility comments: increased time, assist for LEs over EOB, but helped 50% to raise his trunk using UEs on rail, assisted to guide upper body to sidelying and for LEs back into bed, rolled for pericare and sacral dressing change with +1 max assist  Transfers                 General transfer comment: deferred, pt to go to CIR today    Balance Overall balance  assessment: Needs assistance Sitting-balance support: Feet supported;Single extremity supported;Bilateral upper extremity supported;No upper extremity supported Sitting balance-Leahy Scale: Poor Sitting balance - Comments: pt able to prop on UEs for up to 10 seconds with mattress maximally inflated, sat a total of about 15 minutes                                   ADL either performed or assessed with clinical judgement   ADL                       Lower Body Dressing: Total assistance;Bed level Lower Body Dressing Details (indicate cue type and reason): socks     Toileting- Clothing Manipulation and Hygiene: Total assistance;Bed level               Vision       Perception     Praxis      Cognition Arousal/Alertness: Awake/alert Behavior During Therapy: WFL for tasks assessed/performed Overall Cognitive Status: Within Functional Limits for tasks assessed                                 General Comments: pt continues to do a great job directing his therapy        Exercises     Shoulder Instructions       General Comments      Pertinent Vitals/ Pain       Pain  Assessment: Faces Faces Pain Scale: Hurts even more Pain Location: L lateral ankle, L buttocks/sacrum, B LE spasms with movement Pain Descriptors / Indicators: Grimacing;Spasm;Discomfort;Moaning Pain Intervention(s): Monitored during session;Premedicated before session;Repositioned  Home Living                                          Prior Functioning/Environment              Frequency  Min 2X/week        Progress Toward Goals  OT Goals(current goals can now be found in the care plan section)  Progress towards OT goals: Progressing toward goals  Acute Rehab OT Goals Patient Stated Goal: get stronger and go home OT Goal Formulation: With patient/family Time For Goal Achievement: 11/12/19 Potential to Achieve Goals: Good  Plan  Discharge plan remains appropriate;Frequency remains appropriate    Co-evaluation    PT/OT/SLP Co-Evaluation/Treatment: Yes Reason for Co-Treatment: For patient/therapist safety   OT goals addressed during session: Strengthening/ROM      AM-PAC OT "6 Clicks" Daily Activity     Outcome Measure   Help from another person eating meals?: A Little Help from another person taking care of personal grooming?: A Little Help from another person toileting, which includes using toliet, bedpan, or urinal?: Total Help from another person bathing (including washing, rinsing, drying)?: Total Help from another person to put on and taking off regular upper body clothing?: A Lot Help from another person to put on and taking off regular lower body clothing?: Total 6 Click Score: 11    End of Session Equipment Utilized During Treatment: Oxygen(3L)  OT Visit Diagnosis: Muscle weakness (generalized) (M62.81);Pain   Activity Tolerance Patient tolerated treatment well   Patient Left in bed;with call bell/phone within reach;with nursing/sitter in room   Nurse Communication (aware that sacral dressing changed and urine poured off)        Time: [id number] OT Time Calculation (min): 48 min  Charges: OT General Charges $OT Visit: 1 Visit OT Treatments $Therapeutic Activity: 8-22 mins  Nestor Lewandowsky, OTR/L Acute Rehabilitation Services Pager: [phone] Office: [phone]   Malka So 11/10/2019, 3:56 PM

## 2019-11-10 NOTE — Progress Notes (Signed)
Inpatient Rehabilitation-Admissions Coordinator   Still waiting on a determination regarding CIR request. AC will follow up once there has been a determination.   Raechel Ache, OTR/L  Rehab Admissions Coordinator  [phone] 11/10/2019 1:39 PM

## 2019-11-10 NOTE — Anesthesia Postprocedure Evaluation (Signed)
Anesthesia Post Note  Patient: [name]  Procedure(s) Performed: LUMBAR THREE-FOUR LUMBAR LAMINECTOMY/ DECOMPRESSION WITH MET-RX (N/A Back)     Patient location during evaluation: PACU Anesthesia Type: General Level of consciousness: awake and patient cooperative Pain management: pain level controlled Vital Signs Assessment: post-procedure vital signs reviewed and stable Respiratory status: spontaneous breathing, nonlabored ventilation, respiratory function stable and patient connected to nasal cannula oxygen Cardiovascular status: blood pressure returned to baseline and stable Postop Assessment: no apparent nausea or vomiting Anesthetic complications: no    Last Vitals:  Vitals:   11/10/19 0953 11/10/19 0954  BP:    Pulse:    Resp:    Temp:    SpO2: 95% 93%    Last Pain:  Vitals:   11/10/19 0632  TempSrc: Oral  PainSc: 9                  Shermeka Rutt

## 2019-11-10 NOTE — H&P (Signed)
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 Brett Fairy of neurology services as well as neurosurgery, bowel bladder incontinence.  Per chart review lives with spouse 1 level home with ramped entrance.  Needed assistance for dressing as well as transfers.  He has a PCA who does provide assistance.  He was able to ambulate short household distances with a walker.  Presented 09/17/2019 with generalized fatigue over the last couple of days and decrease in functional mobility.  Cranial CT scan was negative for acute changes.  Admission chemistries BUN 43, creatinine 1.64, WBC 13,000, troponin negative, urinalysis negative nitrite with urine culture 30,000 diphtheroids, blood cultures no growth to date.  MRI thoracic lumbar cervical spine showed chronic changes with patchy cord signal abnormality at the C3-4 level consistent with chronic myelomalacia.  Multilevel degenerative disc disease of the lower thoracic spine without high-grade spinal stenosis as well as lumbar spondylosis and congenitally short pedicles causing prominent impingement at L2-3 and 3-4 with moderate impingement L1-2 and 4-5 as well as T12-L1 and L5-S1.  Impingement of L1-2 and 2-3 minimally worsened from prior exam 07/24/2017.  Neurosurgery Dr. Kristeen Miss consulted in regards to MRI findings patient not felt to be a surgical candidate advised IV Solu-Medrol then transition to prolonged prednisone with taper.   Echocardiogram with ejection fraction 60% no wall motion abnormalities.  Chronic pain control ongoing with the use of scheduled baclofen titrated to 15 mg 3 times daily as well as nightly Neurontin and OxyContin 10 mg every 12 hours.  Therapy evaluations completed and patient was admitted to inpatient rehab services 09/26/2019 with slow overall progress.  Ongoing care of patient's spasticity with follow-up recommendations followed by neurology as well as neurosurgery and considerations were even made for a possible baclofen pump.  Follow-up cervical lumbar thoracic myelogram completed showing severe degenerative with mild scoliosis and listhesis as well as L3-4 severe spinal stenosis L2-3 moderate to advanced spinal stenosis L1-2 moderate spinal stenosis.  Resolved cord compression after ACDF.  There was some bilateral foraminal narrowing noted.  Thoracic spine diffuse degenerative disease without cord compression.  Neurosurgery again discussed at length with family in regards to his weakness spasticity myelogram findings recommendations for L3-4 decompression.  During patient's rehabilitation stay he did remain on Lovenox for DVT prophylaxis OSA with CPAP chronic oxygen monitoring followed by pulmonary services.  Wound care consulted 10/23/2019 for left ankle ulcer as well as left buttock with wound care as directed.  Left ankle films were completed showing no evidence of osteomyelitis fracture or other acute abnormality no visible abscess.  Patient was transferred to acute care services 10/28/2019 and underwent lumbar 3-4 laminectomy decompression 11/04/2019 per Dr. Venetia Constable.  Palliative care was consulted to establish goals of care.  He was placed on subcutaneous heparin for DVT prophylaxis.  CPAP ongoing as well as oxygen needs for history of OSA/chronic respiratory failure.  Wound care ongoing for left buttock sacral wound and left lateral ankle wound.  Therapy evaluations completed and patient was admitted for a  comprehensive  rehab program.   Review of Systems  Constitutional: Positive for fever. Negative for chills.  HENT: Negative for hearing loss.   Eyes: Negative for blurred vision and double vision.  Respiratory: Positive for cough, shortness of breath and wheezing.   Cardiovascular: Positive for palpitations and leg swelling. Negative for chest pain.  Gastrointestinal: Positive for constipation. Negative for heartburn, nausea and vomiting.       GERD  Genitourinary: Positive for urgency. Negative for dysuria, flank pain and hematuria.  Musculoskeletal: Positive for back pain, joint pain and myalgias.  Skin: Negative for rash.  Neurological: Positive for sensory change, weakness and headaches.       Restless leg syndrome with myoclonic jerking and spasticity  Psychiatric/Behavioral: The patient has insomnia.   All other systems reviewed and are negative.       Past Medical History:  Diagnosis Date  . Arthritis    . Barrett esophagus    . Bronchitis    . Carotid artery occlusion    . COPD (chronic obstructive pulmonary disease) ([place])    . Diabetes mellitus without complication ([place])    . GERD (gastroesophageal reflux disease)    . Headache      migraines  . History of thyroid cancer 2008  . Hypertension    . Restless leg    . Sleep apnea with use of continuous positive airway pressure (CPAP)      2011 [facility] , [facility]  77cn central and obstrcutive. 16 cm water , 3 cm EPR.   . Thoracic ascending aortic aneurysm ([facility])      4.2 cm ascending TAA 08/18/17 CTA. Annual imaging recommended.  . thyroid ca dx'd 2009 or 2010    surg and radioactive isoptope  . Ulcer      Peptic ulcer disease    Past Surgical History:  Procedure Laterality Date  . ANTERIOR CERVICAL DECOMP/DISCECTOMY FUSION N/A 09/03/2017    Procedure: ACDF - C3-C4 - C4-C5 - C5-C6;  Surgeon: Kary Kos, MD;  Location: [facility];  Service: Neurosurgery;  Laterality: N/A;  . CAROTID ENDARTERECTOMY Left January 03, 2006    Dr.  Amedeo Plenty  . COLONOSCOPY      . EYE SURGERY Bilateral      cataract surgery with lens implants  . game keepers thumb Right    . JOINT REPLACEMENT Left      knee X 2  . LUMBAR LAMINECTOMY/ DECOMPRESSION WITH MET-RX N/A 11/04/2019    Procedure: LUMBAR THREE-FOUR LUMBAR LAMINECTOMY/ DECOMPRESSION WITH MET-RX;  Surgeon: Judith Part, MD;  Location: [facility];  Service: Neurosurgery;  Laterality: N/A;  . RADIOLOGY WITH ANESTHESIA N/A 10/16/2019    Procedure: MRI THORACIC LUMBAR SPINE;  Surgeon: Radiologist, Medication, MD;  Location: [facility];  Service: Radiology;  Laterality: N/A;  . ROTATOR CUFF REPAIR Right    . THYROIDECTOMY   2008         Family History  Problem Relation Age of Onset  . COPD Mother    . Hyperlipidemia Mother    . Hypertension Mother    . Diabetes Father    . Kidney disease Father          ESRD  . Hypertension Father    . Cancer Father    . Hyperlipidemia Father      Social History:  reports that he quit smoking about 14 years ago. His smoking use included cigars and cigarettes. He quit after 40.00 years of use. He has never used smokeless tobacco. He reports  that he does not drink alcohol or use drugs. Allergies: No Known Allergies       Medications Prior to Admission  Medication Sig Dispense Refill  . furosemide (LASIX) 40 MG tablet Take 40 mg by mouth daily.      . metoprolol succinate (TOPROL-XL) 50 MG 24 hr tablet Take 50 mg by mouth daily. Take with or immediately following a meal.      . rizatriptan (MAXALT) 10 MG tablet Take 10 mg by mouth as needed for migraine. May repeat in 2 hours if needed      . spironolactone (ALDACTONE) 25 MG tablet Take 25 mg by mouth 2 (two) times daily.      [name] acetaminophen (TYLENOL) 325 MG tablet Take 2 tablets (650 mg total) by mouth every 6 (six) hours as needed (pain or temperature).      [name] albuterol (PROVENTIL) (2.5 MG/3ML) 0.083% nebulizer solution Inhale 3 mLs (2.5 mg total) into the lungs every 4 (four) hours as needed for  wheezing or shortness of breath. 75 mL 12  . allopurinol (ZYLOPRIM) 300 MG tablet Take 300 mg by mouth daily.    12  . aspirin EC 81 MG EC tablet Take 1 tablet (81 mg total) by mouth daily.      . baclofen (LIORESAL) 10 MG tablet TAKE 1 TABLET BY MOUTH 3 TIMES DAILY. (Patient taking differently: Take 10 mg by mouth 3 (three) times daily. ) 270 tablet 0  . baclofen (LIORESAL) 20 MG tablet Take 2 tablets (40 mg total) by mouth at bedtime. 30 each 0  . calcium citrate (CALCITRATE - DOSED IN MG ELEMENTAL CALCIUM) 950 (200 Ca) MG tablet Take 1 tablet (200 mg of elemental calcium total) by mouth daily.      . carbidopa-levodopa (SINEMET IR) 10-100 MG tablet Take 1 tablet by mouth 2 (two) times daily.       . cholecalciferol (VITAMIN D) 1000 units tablet Take 1,000 Units by mouth daily.       . colchicine 0.6 MG tablet Take 0.6 mg by mouth daily.      . collagenase (SANTYL) ointment Apply topically daily. 15 g 0  . enoxaparin (LOVENOX) 40 MG/0.4ML injection Inject 0.4 mLs (40 mg total) into the skin daily. 0 mL    . fluticasone furoate-vilanterol (BREO ELLIPTA) 200-25 MCG/INH AEPB Inhale 1 puff into the lungs daily.      [name] gabapentin (NEURONTIN) 100 MG capsule Take 1 capsule (100 mg total) by mouth at bedtime. 30 capsule 1  . guaiFENesin (MUCINEX) 600 MG 12 hr tablet Take 2 tablets (1,200 mg total) by mouth 2 (two) times daily.      [name] latanoprost (XALATAN) 0.005 % ophthalmic solution Place 1 drop into both eyes at bedtime. 2.5 mL 12  . levETIRAcetam (KEPPRA) 250 MG tablet Take 250 mg by mouth 2 (two) times daily.      [name] levothyroxine (SYNTHROID) 125 MCG tablet Take 125 mcg by mouth daily before breakfast.       . Multiple Vitamin (MULTIVITAMIN) tablet Take 1 tablet by mouth daily.      . nitroGLYCERIN (NITROSTAT) 0.4 MG SL tablet Place 0.4 mg every 5 (five) minutes as needed under the tongue for chest pain.      . [EXPIRED] oxyCODONE (OXYCONTIN) 10 mg 12 hr tablet Take 1 tablet (10 mg total) by mouth  every 12 (twelve) hours for 5 days. 10 tablet 0  . pantoprazole (PROTONIX) 40 MG tablet Take 40 mg by mouth every  evening.      . polyethylene glycol powder (MIRALAX) 17 GM/SCOOP powder Take by mouth as directed. 17 gm every other day       . simvastatin (ZOCOR) 40 MG tablet Take 40 mg by mouth daily at 6 PM.    0  . tamsulosin (FLOMAX) 0.4 MG CAPS capsule Take 2 capsules (0.8 mg total) by mouth daily after supper. 30 capsule    . tiZANidine (ZANAFLEX) 2 MG tablet Take 1 tablet (2 mg total) by mouth 3 (three) times daily. 30 tablet 0  . traZODone (DESYREL) 50 MG tablet Take 1 tablet (50 mg total) by mouth at bedtime as needed for sleep.      . TRELEGY ELLIPTA 100-62.5-25 MCG/INH AEPB Inhale 1 puff into the lungs daily.    4  . umeclidinium bromide (INCRUSE ELLIPTA) 62.5 MCG/INH AEPB Inhale 1 puff into the lungs daily.          Drug Regimen Review Drug regimen was reviewed and remains appropriate with no significant issues identified   Home: Home Living Family/patient expects to be discharged to:: Private residence Living Arrangements: Spouse/significant other Available Help at Discharge: Family, Available 24 hours/day, Personal care attendant Type of Home: House Home Access: Ramped entrance Home Layout: One level Bathroom Shower/Tub: Multimedia programmer: Handicapped height Bathroom Accessibility: Yes Home Equipment: Environmental consultant - 2 wheels, Wheelchair - manual, Bedside commode, Shower seat, Grab bars - tub/shower, Other (comment), Hand held shower head, Adaptive equipment Additional Comments: PCA assists with bed mobility and dressing (transfers). Pt d/c from CIR 10/27/19  Lives With: Spouse   Functional History: Prior Function Level of Independence: Needs assistance Gait / Transfers Assistance Needed: PCA assists with bed mobility, transfers (SPT) ADL's / Homemaking Assistance Needed: assist for dressing (including adult diaper changes), transfers for tub with shower chair, does  not do IADL Comments: PCA to assist in morning and evening 7-10AM and 7-10PM to help with bathing, dressing, getting in/out of bed. Needs assist for transfers using RW, ambulated at most ~165f 3-4 weeks ago using RW with w/c follow for safety during outpatient therapy but states a week later "I couldn't even stand up" at therapy.   Functional Status:  Mobility: Bed Mobility Overal bed mobility: Needs Assistance Bed Mobility: Rolling, Sidelying to Sit Rolling: +2 for safety/equipment, Max assist Sidelying to sit: +2 for physical assistance, Max assist Supine to sit: Max assist, +2 for physical assistance Sit to supine: Total assist, +2 for physical assistance, HOB elevated General bed mobility comments: able to initiate reaching for rails and attempted to push and assist with bringing trunk upright. Transfers Overall transfer level: Needs assistance Transfer via Lift Equipment: Maximove Transfers: Lateral/Scoot Transfers  Lateral/Scoot Transfers: Max assist, +2 physical assistance, From elevated surface General transfer comment: seated eob with intermittent Bil UE support on bed rail and mattress; Able to push through Bil UEs and aide in initiation of side scooting to the R to recliner;  once in recliner able to utilize arm rests and scoot each hip back with lateral leans.  Required cues for sequencing and technique Ambulation/Gait General Gait Details: not attempted   ADL: ADL Overall ADL's : Needs assistance/impaired Eating/Feeding: Sitting, Total assistance Eating/Feeding Details (indicate cue type and reason): requried total to drink sitting EOB; pt declined holding cup independently d/t feeling "too unsteady" Grooming: Wash/dry face, Brushing hair Grooming Details (indicate cue type and reason): simulated with pt. reaching to beard with R hand with no lob noted, also bringing R hand to  top of head.  alternated and ws able to bring L hand to top of head before slight lob and able to  self correct with ue support on bed rail with L hand and bed mattress with R hand. Upper Body Bathing: Moderate assistance, Sitting Lower Body Bathing: Maximal assistance, Sitting/lateral leans Upper Body Dressing : Moderate assistance Lower Body Dressing: Total assistance, Bed level Lower Body Dressing Details (indicate cue type and reason): diaper Toilet Transfer Details (indicate cue type and reason): defer transfer but able to transition EOB with MAX A+2 and intermittent MIN A for sitting balance Toileting- Clothing Manipulation and Hygiene: Total assistance, Bed level Toileting - Clothing Manipulation Details (indicate cue type and reason): pt with bowel incontinence, provided pericare and replaced sacral dressing Functional mobility during ADLs: Maximal assistance, +2 for physical assistance(bed mobility only) General ADL Comments: pt. able to simulate beginning movements for grooming tasks while seated eob.   Cognition: Cognition Overall Cognitive Status: Within Functional Limits for tasks assessed Arousal/Alertness: Awake/alert Orientation Level: Oriented X4 Attention: Focused, Sustained Focused Attention: Appears intact Sustained Attention: Appears intact Memory: Appears intact Awareness: Appears intact Problem Solving: Appears intact Safety/Judgment: Appears intact Cognition Arousal/Alertness: Awake/alert Behavior During Therapy: WFL for tasks assessed/performed Overall Cognitive Status: Within Functional Limits for tasks assessed General Comments: pt directs his care very well (aware of precautions, movements that cause spascity, hospital environment); pleasant   Physical Exam: Blood pressure 111/67, pulse 78, temperature 98.1 F (36.7 C), resp. rate 19, height '5\' 7"'  (1.702 m), weight 94.8 kg, SpO2 92 %. Physical Exam  Neurological:  Patient is alert.  Complaints of ongoing spasticity.  Provides his name and age.  Follows simple commands.  Skin:  Left lateral ankle  wound is dressed.  Patient was not able to be turned in bed to examine sacral area due to ongoing spasticity    General: No acute distress Mood and affect are appropriate Heart: Regular rate and rhythm no rubs murmurs or extra sounds Lungs: Clear to auscultation, breathing unlabored, no rales or wheezes Abdomen: Positive bowel sounds, soft nontender to palpation, nondistended Extremities: No clubbing, cyanosis, or edema Skin: Sacral decubitus irregular  ~10cm at widest point stages 1, 2-3 and unstageable portion under eschar     Neurologic: Cranial nerves II through XII intact, motor strength is 4/5 in bilateral deltoid, bicep, tricep, grip,  Difficult to perform MMT in LEs had increase knee flexor tone limiting ROM  Sensory exam normal sensation to light touch lower extremities  Musculoskeletal: Bilateral hip flexor and knee flexor contracture vs MAS 4 spasticity   Lab Results Last 48 Hours        Results for orders placed or performed during the hospital encounter of 10/28/19 (from the past 48 hour(s))  Glucose, capillary     Status: None    Collection Time: 11/08/19  8:14 AM  Result Value Ref Range    Glucose-Capillary 88 70 - 99 mg/dL      Comment: Glucose reference range applies only to samples taken after fasting for at least 8 hours.  Glucose, capillary     Status: Abnormal    Collection Time: 11/08/19 11:54 AM  Result Value Ref Range    Glucose-Capillary 143 (H) 70 - 99 mg/dL      Comment: Glucose reference range applies only to samples taken after fasting for at least 8 hours.  CBC with Differential/Platelet     Status: Abnormal    Collection Time: 11/08/19  3:59 PM  Result Value Ref Range  WBC 12.1 (H) 4.0 - 10.5 K/uL    RBC 4.07 (L) 4.22 - 5.81 MIL/uL    Hemoglobin 11.5 (L) 13.0 - 17.0 g/dL    HCT 36.8 (L) 39.0 - 52.0 %    MCV 90.4 80.0 - 100.0 fL    MCH 28.3 26.0 - 34.0 pg    MCHC 31.3 30.0 - 36.0 g/dL    RDW 14.8 11.5 - 15.5 %    Platelets 215 150 - 400 K/uL     nRBC 0.0 0.0 - 0.2 %    Neutrophils Relative % 73 %    Neutro Abs 9.0 (H) 1.7 - 7.7 K/uL    Lymphocytes Relative 12 %    Lymphs Abs 1.4 0.7 - 4.0 K/uL    Monocytes Relative 7 %    Monocytes Absolute 0.8 0.1 - 1.0 K/uL    Eosinophils Relative 6 %    Eosinophils Absolute 0.7 (H) 0.0 - 0.5 K/uL    Basophils Relative 1 %    Basophils Absolute 0.1 0.0 - 0.1 K/uL    Immature Granulocytes 1 %    Abs Immature Granulocytes 0.07 0.00 - 0.07 K/uL      Comment: Performed at [facility] [address] [place], [place] [address]  Comprehensive metabolic panel     Status: Abnormal    Collection Time: 11/08/19  3:59 PM  Result Value Ref Range    Sodium 141 135 - 145 mmol/L    Potassium 4.2 3.5 - 5.1 mmol/L    Chloride 98 98 - 111 mmol/L    CO2 33 (H) 22 - 32 mmol/L    Glucose, Bld 131 (H) 70 - 99 mg/dL      Comment: Glucose reference range applies only to samples taken after fasting for at least 8 hours.    BUN 24 (H) 8 - 23 mg/dL    Creatinine, Ser 0.79 0.61 - 1.24 mg/dL    Calcium 9.0 8.9 - 10.3 mg/dL    Total Protein 6.2 (L) 6.5 - 8.1 g/dL    Albumin 3.1 (L) 3.5 - 5.0 g/dL    AST 17 15 - 41 U/L    ALT 16 0 - 44 U/L    Alkaline Phosphatase 67 38 - 126 U/L    Total Bilirubin 0.6 0.3 - 1.2 mg/dL    GFR calc non Af Amer >60 >60 mL/min    GFR calc Af Amer >60 >60 mL/min    Anion gap 10 5 - 15      Comment: Performed at [facility], [place] [address], [place], [place] [address]  Glucose, capillary     Status: Abnormal    Collection Time: 11/08/19  4:27 PM  Result Value Ref Range    Glucose-Capillary 135 (H) 70 - 99 mg/dL      Comment: Glucose reference range applies only to samples taken after fasting for at least 8 hours.  Glucose, capillary     Status: None    Collection Time: 11/09/19  8:04 AM  Result Value Ref Range    Glucose-Capillary 93 70 - 99 mg/dL      Comment: Glucose reference range applies only to samples taken after fasting for at least 8 hours.  Glucose,  capillary     Status: Abnormal    Collection Time: 11/09/19 12:09 PM  Result Value Ref Range    Glucose-Capillary 120 (H) 70 - 99 mg/dL      Comment: Glucose reference range applies only to samples  taken after fasting for at least 8 hours.  Glucose, capillary     Status: Abnormal    Collection Time: 11/09/19  4:26 PM  Result Value Ref Range    Glucose-Capillary 113 (H) 70 - 99 mg/dL      Comment: Glucose reference range applies only to samples taken after fasting for at least 8 hours.  Glucose, capillary     Status: Abnormal    Collection Time: 11/09/19  7:40 PM  Result Value Ref Range    Glucose-Capillary 111 (H) 70 - 99 mg/dL      Comment: Glucose reference range applies only to samples taken after fasting for at least 8 hours.  Glucose, capillary     Status: Abnormal    Collection Time: 11/09/19 11:02 PM  Result Value Ref Range    Glucose-Capillary 120 (H) 70 - 99 mg/dL      Comment: Glucose reference range applies only to samples taken after fasting for at least 8 hours.      Imaging Results (Last 48 hours)  No results found.           Medical Problem List and Plan: 1.  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  Status post L3-4 lumbar laminectomy decompression             -patient may  shower             -ELOS/Goals: one person assist ADL and mobility goals, heal skin to stage 2 or better  2.  Antithrombotics: -DVT/anticoagulation: Subcutaneous heparin.  Recent venous Doppler studies negative.             -antiplatelet therapy: Aspirin 81 mg daily 3. Pain Management: Baclofen 30 mg 3 times daily and 40 mg nightly, Zanaflex 2 mg 3 times daily, Neurontin 100 mg nightly, hydrocodone and Robaxin as needed.  Imitrex as needed for headaches Would consider Botox 150-200 Units each hamstring 4. Mood: Trazodone as needed             -antipsychotic agents: N/A 5. Neuropsych: This patient is capable  of making decisions on his own behalf. 6. Skin/Wound Care: Apply Santyl ointment to left buttock sacral wound and left lateral ankle wound in a nickel thick layer.  Cover with saline moistened gauze then sized appropriate foam dressing changed daily, as discussed with PT/Wound care may benefit from hydrotherapy after 1-2 wks of colllagenase  7. Fluids/Electrolytes/Nutrition: Routine in and outs with follow-up chemistries 8.  Chronic restless leg syndrome.  Sinemet 10-100 mg twice daily 9.  Myoclonic jerking.  Keppra 250 mg twice daily 10.  OSA/chronic respiratory failure.  Continue CPAP and oxygen as directed with nebulizer treatments 11.  Neurogenic bowel bladder.  Flomax 0.8 mg daily.  Check PVR. 12.  Hypothyroidism.  Synthroid 13.  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  Follow-up outpatient 14.  Chronic diastolic congestive heart failure.  Monitor for any signs of fluid overload.  Lasix 40 mg daily.  Continue Aldactone 25 mg daily 15.  Hypertension.  Toprol-XL 50 mg daily. 16.  Hyperlipidemia.  Lipitor 17.  Gout.  Zyloprim 300 mg daily.    Lavon Paganini Angiulli, PA-C "I have personally performed a face to face diagnostic evaluation of this patient.  Additionally, I have reviewed and concur with the physician assistant's documentation above." Charlett Blake M.D. [facility] FAAPM&R (Neuromuscular Med) Diplomate Am Board of Electrodiagnostic Med Fellow Am Board of Interventional Pain  11/10/2019

## 2019-11-10 NOTE — Progress Notes (Signed)
Inpatient Rehabilitation-Admissions Coordinator   I have received insurance approval and medical clearance from Dr. Karleen Hampshire for admit to CIR today. Pt and his wife notified of bed offer and have accepted. Reviewed consent forms and insurance benefits letter with both the patient and his wife. All questions answered. TOC and RN notified of plan for today.   Raechel Ache, OTR/L  Rehab Admissions Coordinator  [phone] 11/10/2019 4:27 PM

## 2019-11-10 NOTE — Progress Notes (Signed)
Patient arrived on floor, he is stable and vital signs are appropriate. Asked if he needed anything to where patient replied "no." Left patient with call light in reach and comfortable

## 2019-11-11 ENCOUNTER — Other Ambulatory Visit: Payer: Self-pay

## 2019-11-11 ENCOUNTER — Inpatient Hospital Stay (HOSPITAL_COMMUNITY): Payer: Medicare Other | Admitting: Occupational Therapy

## 2019-11-11 ENCOUNTER — Inpatient Hospital Stay (HOSPITAL_COMMUNITY): Payer: Medicare Other | Admitting: Physical Therapy

## 2019-11-11 ENCOUNTER — Encounter (HOSPITAL_COMMUNITY): Payer: Self-pay | Admitting: Physical Medicine and Rehabilitation

## 2019-11-11 LAB — COMPREHENSIVE METABOLIC PANEL
ALT: 13 U/L (ref 0–44)
AST: 12 U/L — ABNORMAL LOW (ref 15–41)
Albumin: 3.2 g/dL — ABNORMAL LOW (ref 3.5–5.0)
Alkaline Phosphatase: 81 U/L (ref 38–126)
Anion gap: 11 (ref 5–15)
BUN: 20 mg/dL (ref 8–23)
CO2: 31 mmol/L (ref 22–32)
Calcium: 9.4 mg/dL (ref 8.9–10.3)
Chloride: 98 mmol/L (ref 98–111)
Creatinine, Ser: 0.75 mg/dL (ref 0.61–1.24)
GFR calc Af Amer: >60 mL/min (ref >60)
GFR calc non Af Amer: >60 mL/min (ref >60)
Glucose, Bld: 85 mg/dL (ref 70–99)
Potassium: 4 mmol/L (ref 3.5–5.1)
Sodium: 140 mmol/L (ref 135–145)
Total Bilirubin: 0.7 mg/dL (ref 0.3–1.2)
Total Protein: 6.5 g/dL (ref 6.5–8.1)

## 2019-11-11 LAB — CBC WITH DIFFERENTIAL/PLATELET
Abs Immature Granulocytes: 0.05 10*3/uL (ref 0.00–0.07)
Basophils Absolute: 0.1 10*3/uL (ref 0.0–0.1)
Basophils Relative: 1 %
Eosinophils Absolute: 1 10*3/uL — ABNORMAL HIGH (ref 0.0–0.5)
Eosinophils Relative: 9 %
HCT: 38.2 % — ABNORMAL LOW (ref 39.0–52.0)
Hemoglobin: 12.2 g/dL — ABNORMAL LOW (ref 13.0–17.0)
Immature Granulocytes: 1 %
Lymphocytes Relative: 17 %
Lymphs Abs: 1.8 10*3/uL (ref 0.7–4.0)
MCH: 29 pg (ref 26.0–34.0)
MCHC: 31.9 g/dL (ref 30.0–36.0)
MCV: 90.7 fL (ref 80.0–100.0)
Monocytes Absolute: 0.9 10*3/uL (ref 0.1–1.0)
Monocytes Relative: 8 %
Neutro Abs: 7 10*3/uL (ref 1.7–7.7)
Neutrophils Relative %: 64 %
Platelets: 272 10*3/uL (ref 150–400)
RBC: 4.21 MIL/uL — ABNORMAL LOW (ref 4.22–5.81)
RDW: 15.3 % (ref 11.5–15.5)
WBC: 10.9 10*3/uL — ABNORMAL HIGH (ref 4.0–10.5)
nRBC: 0 % (ref 0.0–0.2)

## 2019-11-11 MED ORDER — BACLOFEN 20 MG PO TABS
30.0000 mg | ORAL_TABLET | Freq: Three times a day (TID) | ORAL | Status: DC
  Administered 2019-11-11 – 2019-11-25 (×42): 30 mg via ORAL
  Filled 2019-11-11 (×41): qty 1

## 2019-11-11 MED ORDER — CHLORHEXIDINE GLUCONATE CLOTH 2 % EX PADS
6.0000 | MEDICATED_PAD | Freq: Two times a day (BID) | CUTANEOUS | Status: DC
  Administered 2019-11-11 – 2019-11-24 (×26): 6 via TOPICAL

## 2019-11-11 MED ORDER — COLCHICINE 0.6 MG PO TABS
0.6000 mg | ORAL_TABLET | Freq: Every day | ORAL | Status: DC
  Administered 2019-11-11 – 2019-11-25 (×15): 0.6 mg via ORAL
  Filled 2019-11-11 (×16): qty 1

## 2019-11-11 MED ORDER — INDOMETHACIN 25 MG PO CAPS
50.0000 mg | ORAL_CAPSULE | Freq: Two times a day (BID) | ORAL | Status: AC
  Administered 2019-11-11 – 2019-11-16 (×10): 50 mg via ORAL
  Filled 2019-11-11 (×10): qty 2

## 2019-11-11 MED ORDER — BACLOFEN 20 MG PO TABS
40.0000 mg | ORAL_TABLET | Freq: Every day | ORAL | Status: DC
  Administered 2019-11-11 – 2019-11-24 (×14): 40 mg via ORAL
  Filled 2019-11-11 (×2): qty 2
  Filled 2019-11-11: qty 4
  Filled 2019-11-11 (×12): qty 2

## 2019-11-11 MED ORDER — IPRATROPIUM-ALBUTEROL 0.5-2.5 (3) MG/3ML IN SOLN
3.0000 mL | Freq: Every day | RESPIRATORY_TRACT | Status: DC
  Administered 2019-11-12 – 2019-11-20 (×8): 3 mL via RESPIRATORY_TRACT
  Filled 2019-11-11 (×10): qty 3

## 2019-11-11 MED ORDER — ENOXAPARIN SODIUM 40 MG/0.4ML ~~LOC~~ SOLN
40.0000 mg | Freq: Every day | SUBCUTANEOUS | Status: DC
  Administered 2019-11-11 – 2019-11-21 (×11): 40 mg via SUBCUTANEOUS
  Filled 2019-11-11 (×12): qty 0.4

## 2019-11-11 NOTE — Progress Notes (Addendum)
[place] PHYSICAL MEDICINE & REHABILITATION PROGRESS NOTE   Subjective/Complaints:   Sacral wound "feels better".   Says gout is bothering him in L foot- very painful- was placed on allopurinol - however we will change to colchicine and indomethacin since renal fxn is good.   Pt says he got nebs this AM, but had to ask for them. .    ROS:  Pt denies SOB, abd pain, CP, N/V/C/D, and vision changes  Objective:   DG CHEST PORT 1 VIEW  Result Date: 11/10/2019 CLINICAL DATA:  Follow-up pneumonia. Productive cough. EXAM: PORTABLE CHEST 1 VIEW COMPARISON:  Chest x-rays dated 10/29/2019 and 10/27/2019 FINDINGS: There has been significant reduction in the left pleural effusion with slight residual left effusion and slight elevation of the left hemidiaphragm and probable slight atelectasis at the left base. There is slight thickening along the fissures in the right lung with minimal atelectasis at the right base medially. The heart size and pulmonary vascularity are normal. No acute bone abnormality. IMPRESSION: 1. Significant reduction in the left pleural effusion, now small. 2. Slight residual atelectasis at the left lung base. Electronically Signed   By: Lorriane Shire M.D.   On: 11/10/2019 10:19   Recent Labs    11/10/19 2001 11/11/19 0737  WBC 13.3* 10.9*  HGB 11.4* 12.2*  HCT 35.6* 38.2*  PLT 269 272   Recent Labs    11/08/19 1559 11/08/19 1559 11/10/19 2001 11/11/19 0737  NA 141  --   --  140  K 4.2  --   --  4.0  CL 98  --   --  98  CO2 33*  --   --  31  GLUCOSE 131*  --   --  85  BUN 24*  --   --  20  CREATININE 0.79   < > 0.94 0.75  CALCIUM 9.0  --   --  9.4   < > = values in this interval not displayed.    Intake/Output Summary (Last 24 hours) at 11/11/2019 1035 Last data filed at 11/11/2019 0657 Gross per 24 hour  Intake --  Output 900 ml  Net -900 ml     Physical Exam: Vital Signs Blood pressure 126/84, pulse 66, temperature 98.2 F (36.8 C),  temperature source Oral, resp. rate 16, height 5\' 7"  (1.702 m), weight 99.8 kg, SpO2 95 %.   Physical Exam General: No acute distress- laying supine in bed- still having bad spasms, but slightly brighter affect; PT in room; also wearing O2, NAD Appropriate affect Heart: RRR Lungs: very coarse- but no crackles- also has a few wheezes- post nebs this AM Abdomen: Soft, NT, ND, (+)BS  Extremities: No clubbing, cyanosis, or edema Skin: Sacral decubitus irregular  ~10cm at widest point stages 1, 3 and unstageable portion under eschar     Neurologic: Cranial nerves II through XII intact, motor strength is 4/5 in bilateral deltoid, bicep, tricep, grip,  Difficult to perform MMT in LEs had increase knee flexor tone limiting ROM Severe LE spasms- every 20 seconds or so in LEs- voices pain every time they occur.  Sensory exam normal sensation to light touch lower extremities Musculoskeletal: Bilateral hip flexor and knee flexor contracture vs MAS 4 spasticity    Assessment/Plan: 1. 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  Physiatrist is providing close team supervision  and 24 hour management of active medical problems listed below.  Physiatrist and rehab team continue to assess barriers to discharge/monitor patient progress toward functional and medical goals  Care Tool:  Bathing              Bathing assist       Upper Body Dressing/Undressing Upper body dressing   What is the patient wearing?: Hospital gown only    Upper body assist Assist Level: Maximal Assistance - Patient 25 - 49%    Lower Body Dressing/Undressing Lower body dressing      What is the patient wearing?: Hospital gown only     Lower body assist       Toileting Toileting    Toileting  assist Assist for toileting: Dependent - Patient 0%     Transfers Chair/bed transfer  Transfers assist           Locomotion Ambulation   Ambulation assist              Walk 10 feet activity   Assist           Walk 50 feet activity   Assist           Walk 150 feet activity   Assist           Walk 10 feet on uneven surface  activity   Assist           Wheelchair     Assist               Wheelchair 50 feet with 2 turns activity    Assist            Wheelchair 150 feet activity     Assist          Blood pressure 126/84, pulse 66, temperature 98.2 F (36.8 C), temperature source Oral, resp. rate 16, height 5\' 7"  (1.702 m), weight 99.8 kg, SpO2 95 %.  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 Status post L3-4 lumbar laminectomy decompression -patient may  shower -ELOS/Goals: one person assist ADL and mobility goals, heal skin to stage 2 or better if possible- hard to keep pt on side due to spasms  5/25- asked to get w/c eva; done asap since 1 of reaosns pt back to rehab/CIR 2. Antithrombotics: -DVT/anticoagulation:Subcutaneous heparin. Recent venous Doppler studies negative.  5/25- changed to lovenox since renal fxn ok -antiplatelet therapy: Aspirin 81 mg daily 3. Pain Management:Baclofen 30 mg 3 times daily and 40 mg nightly, Zanaflex 2 mg 3 times daily, Neurontin 100 mg nightly, hydrocodone and Robaxin as needed. Imitrex as needed for headaches Would consider Botox 150-200 Units each hamstring 4. Mood:Trazodone as needed -antipsychotic agents: N/A 5. Neuropsych: This patientiscapable of making decisions on hisown behalf. 6. Skin/Wound Care:Apply Santyl ointment to left buttock sacral wound and left lateral ankle wound in a nickel  thick layer. Cover with saline moistened gauze then sized appropriate foam dressing changed daily, as discussed with PT/Wound care may benefit from hydrotherapy after 1-2 wks of colllagenase   5/25- also need to assess L ankle 7. Fluids/Electrolytes/Nutrition:Routine in and outs with follow-up chemistries 8. Chronic restless leg syndrome. Sinemet 10-100 mg twice daily 9. 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 OSA/chronic respiratory failure. Continue CPAP and oxygen as directed with nebulizer treatments  5/25- add duonebs at 8am in morning 11. Neurogenic bowel bladder. Flomax 0.8 mg daily. Check PVR. 12.  Hypothyroidism. Synthroid 13. 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 Follow-up outpatient 14. Chronic diastolic congestive heart failure. Monitor for any signs of fluid overload. Lasix 40 mg daily. Continue Aldactone 25 mg daily 15. Hypertension. Toprol-XL 50 mg daily. 16. Hyperlipidemia. Lipitor 17. Gout. Zyloprim 300 mg daily.  5/25- change to colchicine since acute gout Sx' and add Indomethacin 50 mg BID with meals for pain x 10 doses  Visited pt again- upset about "sedation- you're drugging me"- went through all meds- no change except keppra 250 mg BID - stopped- it -called wife and spent 15 minutes on phone discussing plan for d/c, w/c eval, etc- total care 45 minutes today    LOS: 1 days A FACE TO FACE EVALUATION WAS PERFORMED  Hind Chesler 11/11/2019, 10:35 AM

## 2019-11-11 NOTE — Progress Notes (Signed)
Inpatient Rehabilitation  Patient information reviewed and entered into eRehab system by Valente Fosberg M. Donnette Macmullen, M.A., CCC/SLP, PPS Coordinator.  Information including medical coding, functional ability and quality indicators will be reviewed and updated through discharge.    

## 2019-11-11 NOTE — Progress Notes (Signed)
Occupational Therapy Assessment and Plan  Patient Details  Name: TOMOYA RINGWALD MRN: [id number] Date of Birth: 23-Dec-1940  OT Diagnosis: lumbago (low back pain), muscular wasting and disuse atrophy, muscle weakness (generalized), pain in joint and pain in thoracic spine ELOS: 7-10 days   Today's Date: 11/11/2019 OT Individual Time: 0952-1100 OT Individual Time Calculation (min): 68 min     Problem List:  Patient Active Problem List   Diagnosis Date Noted  . Palliative care by specialist   . DNR (do not resuscitate)   . Acute on chronic respiratory failure with hypoxia ([place]) 10/29/2019  . Incomplete paraplegia ([place]) 10/28/2019  . Labile blood pressure   . Spasticity   . Muscle spasm   . Hypoxia   . Supplemental oxygen dependent   . Neurogenic bladder   . Chronic diastolic congestive heart failure ([place])   . Neurogenic bowel   . Hypocalcemia   . Spinal stenosis of lumbar region   . Cervical myelopathy ([place]) 09/26/2019  . Diabetes mellitus type 2, controlled, with complications ([place]) 67/05/4579  . Obese 09/17/2019  . Thoracic aortic aneurysm ([place]) 09/17/2019  . Bowel incontinence 09/17/2019  . Bladder incontinence 09/17/2019  . Right hemiparesis ([place]) 09/17/2019  . Acute renal failure (ARF) ([place]) 09/17/2019  . Left hemiparesis ([place]) 09/17/2019  . Chronic respiratory failure with hypoxia ([place]) 09/17/2019  . Syncope and collapse 09/17/2019  . Chronic diastolic CHF (congestive heart failure) ([place]) 09/04/2019  . PVD (peripheral vascular disease) ([place]) 02/14/2019  . Essential hypertension 02/13/2019  . Non-insulin treated type 2 diabetes mellitus ([place]) 02/13/2019  . Severe sepsis ([place]) 01/17/2019  . Community acquired pneumonia of right lower lobe of lung 01/17/2019  . Postural urinary incontinence 05/20/2018  . Myelopathy concurrent with and due to spinal stenosis of cervical region ([place]) 05/20/2018  . Myoclonic jerking 05/20/2018  . 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 History of respiratory failure 08/18/2017  . Hypokalemia 08/18/2017  . Leukocytosis 08/18/2017  . Muscle atrophy of lower extremity 08/09/2017  . Bowel and bladder incontinence 08/09/2017  . Right-sided muscle weakness 08/09/2017  . Neuropathy 08/09/2017  . 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/2017  . Abnormality of gait 07/12/2017  . Myoclonic jerkings, 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/2017  . Acquired right foot drop 07/12/2017  . UTI (urinary tract infection) due to Enterococcus 07/12/2017  . Pressure ulcer 06/21/2017  . Acute cystitis 06/20/2017  . Weakness generalized 06/20/2017  . Dehydration 06/20/2017  . Dyspnea 12/26/2016  . Chronic cough 12/26/2016  . Hypersomnia with sleep apnea 10/30/2016  . CSA (central sleep apnea) 10/30/2016  . Wheezing symptom 10/30/2016  . 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 RLS (restless legs syndrome) 08/25/2014  . Primary gout 08/25/2014  . OSA on CPAP 08/25/2014  . Severe obesity (BMI >= 40) ([place]) 08/25/2014  . Obesity (BMI 30-39.9) 02/18/2013  . 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 S/P TKR (total knee replacement) 11/29/2012  . History of thyroid cancer     Past Medical History:  Past Medical History:  Diagnosis Date  . Arthritis   . Barrett esophagus   . Bronchitis   . Carotid artery occlusion   . COPD (chronic obstructive pulmonary disease) ([place])   . Diabetes mellitus without complication ([place])   . GERD (gastroesophageal reflux disease)   . Headache    migraines  . History of thyroid cancer 2008  . Hypertension   . Restless leg   .  Sleep apnea with use of continuous positive airway pressure (CPAP)    2011 [facility] , AHI  77cn central and obstrcutive. 16 cm water , 3 cm EPR.   . Thoracic ascending aortic aneurysm ([facility])    4.2 cm ascending TAA 08/18/17 CTA. Annual imaging recommended.  . thyroid ca dx'd 2009 or 2010    surg and radioactive isoptope  . Ulcer    Peptic ulcer disease   Past Surgical History:  Past Surgical History:  Procedure Laterality Date  . ANTERIOR CERVICAL DECOMP/DISCECTOMY FUSION N/A 09/03/2017   Procedure: ACDF - C3-C4 - C4-C5 - C5-C6;  Surgeon: Kary Kos, MD;  Location: [facility];  Service: Neurosurgery;  Laterality: N/A;  . CAROTID ENDARTERECTOMY Left January 03, 2006   Dr. Amedeo Plenty  . COLONOSCOPY    . EYE SURGERY Bilateral    cataract surgery with lens implants  . game keepers thumb Right   . JOINT REPLACEMENT Left    knee X 2  . LUMBAR LAMINECTOMY/ DECOMPRESSION WITH MET-RX N/A 11/04/2019   Procedure: LUMBAR THREE-FOUR LUMBAR LAMINECTOMY/ DECOMPRESSION WITH MET-RX;  Surgeon: Judith Part, MD;  Location: [facility];  Service: Neurosurgery;  Laterality: N/A;  . RADIOLOGY WITH ANESTHESIA N/A 10/16/2019   Procedure: MRI THORACIC LUMBAR SPINE;  Surgeon: Radiologist, Medication, MD;  Location: [facility];  Service: Radiology;  Laterality: N/A;  . ROTATOR CUFF REPAIR Right   . 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 Brett Fairy of neurology services as well as neurosurgery, bowel bladder incontinence. Per chart review lives with spouse 1 level home with ramped entrance. Needed assistance for dressing as well as transfers. He has a PCA who does provide assistance. He was able to ambulate short household distances with a walker. Presented 09/17/2019 with generalized  fatigue over the last couple of days and decrease in functional mobility. Cranial CT scan was negative for acute changes. Admission chemistries BUN 43, creatinine 1.64, WBC 13,000, troponin negative, urinalysis negative nitrite with urine culture 30,000 diphtheroids, blood cultures no growth to date. MRI thoracic lumbar cervical spine showed chronic changes with patchy cord signal abnormality at the C3-4 level consistent with chronic myelomalacia. Multilevel degenerative disc disease of the lower thoracic spine without high-grade spinal stenosis as well as lumbar spondylosis and congenitally short pedicles causing prominent impingement at L2-3 and 3-4 with moderate impingement L1-2 and 4-5 as well as T12-L1 and L5-S1. Impingement of L1-2 and 2-3 minimally worsened from prior exam 07/24/2017. Neurosurgery Dr. Kristeen Miss consulted in regards to MRI findings patient not felt to be a surgical candidate advised IV Solu-Medrol then transition to prolonged prednisone with taper. Echocardiogram with ejection fraction 60% no wall motion abnormalities. Chronic pain control ongoing with the use of scheduled baclofen titrated to 15 mg 3 times daily as well as nightly Neurontin and OxyContin 10 mg every 12 hours. Therapy evaluations completed and patient was admitted to inpatient rehab services 09/26/2019 with slow overall progress. Ongoing care of patient's spasticity with follow-up recommendations followed by neurology as well as neurosurgery and  considerations were even made for a possible baclofen pump. Follow-up cervical lumbar thoracic myelogram completed showing severe degenerative with mild scoliosis and listhesis as well as L3-4 severe spinal stenosis L2-3 moderate to advanced spinal stenosis L1-2 moderate spinal stenosis. Resolved cord compression after ACDF. There was some bilateral foraminal narrowing noted. Thoracic spine diffuse degenerative disease without cord compression. Neurosurgery again  discussed at length with family in regards to his weakness spasticity myelogram findings recommendations for L3-4 decompression. During patient's rehabilitation stay he did remain on Lovenox for DVT prophylaxis OSA with CPAP chronic oxygen monitoring followed by pulmonary services. Wound care consulted 10/23/2019 for left ankle ulcer as well as left buttock with wound care as directed. Left ankle films were completed showing no evidence of osteomyelitis fracture or other acute abnormality no visible abscess. Patient was transferred to acute care services 10/28/2019 and underwent lumbar 3-4 laminectomy decompression 11/04/2019 per Dr. Venetia Constable. Palliative care was consulted to establish goals of care. He was placed on subcutaneous heparin for DVT prophylaxis. CPAP ongoing as well as oxygen needs for history of OSA/chronic respiratory failure. Wound care ongoing for left buttock sacral wound and left lateral ankle wound. Patient transferred to CIR on 11/10/2019 .    Patient currently requires Max A +2 to total A with basic self-care skills secondary to muscle weakness and muscle paralysis, decreased cardiorespiratoy endurance and decreased oxygen support and decreased sitting balance, decreased standing balance, decreased postural control and decreased balance strategies.  Prior to hospitalization, patient could complete BADLs with modified independence and use of AE/DME.  Patient will benefit from skilled intervention to decrease level of assist with basic self-care skills and increase independence with basic self-care skills prior to discharge home with care partner.  Anticipate patient will require 24 hour supervision and maximal physical assistance and follow up home health.  OT - End of Session Activity Tolerance: Tolerates < 10 min activity with changes in vital signs Endurance Deficit: Yes Endurance Deficit Description: frequent rest breaks 2/2 pain, fatigue and changes in vitals. OT  Assessment OT Barriers to Discharge: Incontinence;Wound Care;Home environment access/layout OT Patient demonstrates impairments in the following area(s): Balance;Endurance;Motor;Pain;Sensory OT Basic ADL's Functional Problem(s): Grooming;Bathing;Dressing;Toileting OT Transfers Functional Problem(s): Toilet;Tub/Shower OT Plan OT Intensity: Minimum of 1-2 x/day, 45 to 90 minutes OT Frequency: 5 out of 7 days OT Duration/Estimated Length of Stay: 7-10 days OT Treatment/Interventions: Balance/vestibular training;Discharge planning;Self Care/advanced ADL retraining;Therapeutic Exercise;Wheelchair propulsion/positioning;DME/adaptive equipment instruction;UE/LE Strength taining/ROM;Patient/family education;UE/LE Coordination activities;Functional mobility training;Therapeutic Activities;Pain management OT Basic Self-Care Anticipated Outcome(s): Max A at bed level OT Toileting Anticipated Outcome(s): Max A at bed level OT Bathroom Transfers Anticipated Outcome(s): N/A OT Recommendation Recommendations for Other Services: Neuropsych consult;Therapeutic Recreation consult Patient destination: Home Follow Up Recommendations: Home health OT;24 hour supervision/assistance Equipment Recommended: To be determined Equipment Details: owns commode, shower seat, assistive devices   Skilled Therapeutic Intervention Patient greeted lying supine in bed in agreement with OT eval/treat. OT established and discussed POC, provided re-education on rehab process, and role of OT with patient/family in agreement. Patient completed bathing at bed level with total A (pt completed ~15-20% of task). Patient able to roll R<>L with +2 helpers for LB dressing requiring assistance to thread BLE and hike pants over hips in side-lying with use of log-rolling technique to maintain back precautions. Supine to sit EOB and anterior scoot toward EOB with +2 helpers. Patient in agreement with attempt for lateral scoot transfer to TIS wc on  L requiring total A +2 (pt completed ~15-20%) with BLE  supported on low step. Patient completed oral hygiene seated in TIS wc with Min A. Session concluded with call bell within reach and all needs met.   OT Evaluation Precautions/Restrictions  Precautions Precautions: Fall;Back Precaution Booklet Issued: Yes (comment) Precaution Comments: L lateral ankle ulcer, L buttock/sacral ulcer Restrictions Weight Bearing Restrictions: No General Chart Reviewed: Yes Family/Caregiver Present: Yes Vital Signs Therapy Vitals Temp: 98.2 F (36.8 C) Pulse Rate: 69 Resp: 17 BP: (!) 92/57 Patient Position (if appropriate): Lying Oxygen Therapy SpO2: 91 % O2 Device: Room Air Pain Pain Assessment Pain Scale: 0-10 Pain Score: 8  Pain Type: Neuropathic pain Pain Location: Back Pain Orientation: Lower Pain Descriptors / Indicators: Aching Pain Onset: On-going Pain Intervention(s): Medication (See eMAR);RN made aware;Repositioned;Emotional support;Relaxation Multiple Pain Sites: Yes 2nd Pain Site Pain Score: 8 Pain Type: Chronic pain Pain Location: Ankle Pain Descriptors / Indicators: Aching Pain Frequency: Other (Comment)(With touching) Pain Onset: With Activity Home Living/Prior [place] expects to be discharged to:: Private residence Living Arrangements: Spouse/significant other Available Help at Discharge: Family, Available 24 hours/day, Personal care attendant Type of Home: House Home Access: Ramped entrance [place]: One level Bathroom Shower/Tub: Multimedia programmer: Handicapped height Bathroom Accessibility: Yes Additional Comments: prior to hospital admission had PCA to assist with dressing and transfers  Lives With: Spouse Prior Function Level of Independence: Needs assistance with gait, Needs assistance with tranfers  Able to Take Stairs?: No Driving: No Comments: PCA to assist in morning and evening 7-10AM and 7-10PM to help  with bathing, dressing, getting in/out of bed. Needs assist for transfers using RW, ambulated at most ~139f 3-4 weeks ago using RW with w/c follow for safety during outpatient therapy but states a week later "I couldn't even stand up" at therapy. ADL ADL Eating: Set up Where Assessed-Eating: Bed level Grooming: Minimal assistance Where Assessed-Grooming: Chair Upper Body Bathing: Dependent Where Assessed-Upper Body Bathing: Bed level Lower Body Bathing: Dependent Where Assessed-Lower Body Bathing: Bed level Upper Body Dressing: Maximal assistance Where Assessed-Upper Body Dressing: Bed level Lower Body Dressing: Dependent Where Assessed-Lower Body Dressing: Bed level Toilet Transfer: Unable to assess Toilet Transfer Method: Unable to assess Tub/Shower Transfer: Unable to assess Tub/Shower Transfer Method: Unable to assess Vision Baseline Vision/History: Wears glasses Wears Glasses: Reading only Patient Visual Report: No change from baseline Vision Assessment?: No apparent visual deficits Perception  Perception: Within Functional Limits Praxis Praxis: Intact Cognition Overall Cognitive Status: Within Functional Limits for tasks assessed Arousal/Alertness: Awake/alert Orientation Level: Person;Place;Situation Person: Oriented Place: Oriented Situation: Oriented Year: 2021 Month: May Day of Week: Incorrect Memory: Appears intact Immediate Memory Recall: Sock;Blue;Bed Memory Recall Sock: Without Cue Memory Recall Bed: With Cue Attention: Focused;Sustained Focused Attention: Appears intact Sustained Attention: Appears intact Awareness: Appears intact Problem Solving: Appears intact Safety/Judgment: Appears intact Sensation Sensation Light Touch: Impaired Detail Peripheral sensation comments: reports R UE and LE sensation differen then L Light Touch Impaired Details: Impaired RLE;Impaired RUE Hot/Cold: Impaired Detail Hot/Cold Impaired Details: Impaired RUE;Impaired  LUE;Impaired RLE;Impaired LLE Proprioception: Impaired Detail Proprioception Impaired Details: Impaired RLE;Impaired LLE Stereognosis: Not tested Coordination Gross Motor Movements are Fluid and Coordinated: No Fine Motor Movements are Fluid and Coordinated: No Coordination and Movement Description: impaired due to B LE paresis, impaired balance, and poor activity tolerance Motor  Motor Motor: Abnormal tone;Abnormal postural alignment and control;Other (comment) Motor - Skilled Clinical Observations: ongoing BLE spasticity limiting functional mobility Mobility  Bed Mobility Bed Mobility: Rolling Right;Rolling Left Rolling Right: 2 Helpers Rolling Left: 2  Helpers Supine to Sit: 2 Helpers Sit to Supine: 2 Helpers  Trunk/Postural Assessment  Cervical Assessment Cervical Assessment: Exceptions to WFL(Forward head) Thoracic Assessment Thoracic Assessment: Exceptions to WFL(kyphotic) Lumbar Assessment Lumbar Assessment: Exceptions to WFL(Posterior pelvic tilt) Postural Control Postural Control: Deficits on evaluation Righting Reactions: delayed, inadequate, and poor motor planning Protective Responses: delayed, inadequate, and poor motor planning Postural Limitations: decreased with R lateral lean with fatigue  Balance Balance Balance Assessed: Yes Static Sitting Balance Static Sitting - Balance Support: Feet supported;Bilateral upper extremity supported Static Sitting - Level of Assistance: 2: Max assist Dynamic Sitting Balance Dynamic Sitting - Balance Support: Feet supported;During functional activity Dynamic Sitting - Level of Assistance: 2: Max assist Sitting balance - Comments: BUE supported on bed with patient tolerating ~10-15 minutes seated EOB Extremity/Trunk Assessment RUE Assessment Passive Range of Motion (PROM) Comments: WFL Active Range of Motion (AROM) Comments: shoulder flex/abd 90 General Strength Comments: Patient able to maintain position of RUE in space for  short period of time. Unable to tolerate resistance. LUE Assessment Passive Range of Motion (PROM) Comments: WFL Active Range of Motion (AROM) Comments: sh flex/abd 90 General Strength Comments: Patient able to maintain position of LUE in space. Unable to tolerate resistance.     Refer to Care Plan for Long Term Goals  Recommendations for other services: Neuropsych and Therapeutic Recreation  Stress management   Discharge Criteria: Patient will be discharged from OT if patient refuses treatment 3 consecutive times without medical reason, if treatment goals not met, if there is a change in medical status, if patient makes no progress towards goals or if patient is discharged from hospital.  The above assessment, treatment plan, treatment alternatives and goals were discussed and mutually agreed upon: by patient  Mialee Weyman R [place] 11/11/2019, 4:24 PM

## 2019-11-11 NOTE — Plan of Care (Signed)
  Problem: Consults Goal: RH GENERAL PATIENT EDUCATION Description: See Patient Education module for education specifics. Outcome: Progressing Goal: Skin Care Protocol Initiated - if Braden Score 18 or less Description: If consults are not indicated, leave blank or document N/A Outcome: Progressing Goal: Nutrition Consult-if indicated Outcome: Progressing Goal: Diabetes Guidelines if Diabetic/Glucose > 140 Description: If diabetic or lab glucose is > 140 mg/dl - Initiate Diabetes/Hyperglycemia Guidelines & Document Interventions  Outcome: Progressing   Problem: RH BLADDER ELIMINATION Goal: RH STG MANAGE BLADDER WITH ASSISTANCE Description: STG Manage Bladder With Assistance Outcome: Progressing   Problem: RH SKIN INTEGRITY Goal: RH STG SKIN FREE OF INFECTION/BREAKDOWN Outcome: Progressing Goal: RH STG MAINTAIN SKIN INTEGRITY WITH ASSISTANCE Description: STG Maintain Skin Integrity With Assistance. Outcome: Progressing Goal: RH STG ABLE TO PERFORM INCISION/WOUND CARE W/ASSISTANCE Description: STG Able To Perform Incision/Wound Care With Assistance. Outcome: Progressing   Problem: RH SAFETY Goal: RH STG ADHERE TO SAFETY PRECAUTIONS W/ASSISTANCE/DEVICE Description: STG Adhere to Safety Precautions With Assistance/Device. Outcome: Progressing Goal: RH STG DECREASED RISK OF FALL WITH ASSISTANCE Description: STG Decreased Risk of Fall With Assistance. Outcome: Progressing   Problem: RH PAIN MANAGEMENT Goal: RH STG PAIN MANAGED AT OR BELOW PT'S PAIN GOAL Outcome: Progressing

## 2019-11-11 NOTE — Progress Notes (Signed)
Patient ID: [name], male   DOB: Jul 09, 1940, 79 y.o.   MRN: [id number]  SW met with pt and called pt wife [name] [phone] to provide updates from team conference, change in his therapy schedule to 9am and 1pm, and d/c date 11/18/2019. Pt wife will come in for family education on Thursday at [place] and Friday 9am and 1pm. She will follow-up if his caregiver Georgiana Shore will be able to come in on Friday. SW waiting on follow-up.  Loralee Pacas, MSW, [place] Office: [phone] Cell: [phone] Fax: [phone]
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
## 2019-11-11 NOTE — Plan of Care (Signed)
  Problem: RH PAIN MANAGEMENT Goal: RH STG PAIN MANAGED AT OR BELOW PT'S PAIN GOAL Outcome: Progressing   Problem: RH SAFETY Goal: RH STG ADHERE TO SAFETY PRECAUTIONS W/ASSISTANCE/DEVICE Description: STG Adhere to Safety Precautions With Assistance/Device. Outcome: Progressing Goal: RH STG DECREASED RISK OF FALL WITH ASSISTANCE Description: STG Decreased Risk of Fall With Assistance. Outcome: Progressing

## 2019-11-11 NOTE — Patient Care Conference (Signed)
Inpatient RehabilitationTeam Conference and Plan of Care Update Date: 11/11/2019   Time: 4:04 PM    Patient Name: [name]      Medical Record Number: [id number]  Date of Birth: [birth year] Sex: Male         Room/Bed: 4M12C/4M12C-01 Payor Info: Payor: [employer] MEDICARE / Plan: [facility] MEDICARE / Product Type: *No Product type* /    Admit Date/Time:  11/10/2019  7:01 PM  Primary Diagnosis:  Cervical myelopathy ([place])  Patient Active Problem List   Diagnosis Date Noted  . Palliative care by specialist   . DNR (do not resuscitate)   . Acute on chronic respiratory failure with hypoxia ([place]) 10/29/2019  . Incomplete paraplegia ([place]) 10/28/2019  . Labile blood pressure   . Spasticity   . Muscle spasm   . Hypoxia   . Supplemental oxygen dependent   . Neurogenic bladder   . Chronic diastolic congestive heart failure ([place])   . Neurogenic bowel   . Hypocalcemia   . Spinal stenosis of lumbar region   . Cervical myelopathy ([place]) 09/26/2019  . Diabetes mellitus type 2, controlled, with complications ([place]) [id number]  . Obese 09/17/2019  . Thoracic aortic aneurysm ([place]) 09/17/2019  . Bowel incontinence 09/17/2019  . Bladder incontinence 09/17/2019  . Right hemiparesis ([place]) 09/17/2019  . Acute renal failure (ARF) ([place]) 09/17/2019  . Left hemiparesis ([place]) 09/17/2019  . Chronic respiratory failure with hypoxia ([place]) 09/17/2019  . Syncope and collapse 09/17/2019  . Chronic diastolic CHF (congestive heart failure) ([place]) 09/04/2019  . PVD (peripheral vascular disease) ([place]) 02/14/2019  . Essential hypertension 02/13/2019  . Non-insulin treated type 2 diabetes mellitus ([place]) 02/13/2019  . Severe sepsis ([place]) 01/17/2019  . Community acquired pneumonia of right lower lobe of lung 01/17/2019  . Postural urinary incontinence 05/20/2018  . Myelopathy concurrent with and due to spinal stenosis of cervical region ([place]) 05/20/2018  . Myoclonic jerking 05/20/2018  . 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 History of respiratory failure 08/18/2017  . Hypokalemia 08/18/2017  . Leukocytosis 08/18/2017  . Muscle atrophy of lower extremity 08/09/2017  . Bowel and bladder incontinence 08/09/2017  . Right-sided muscle weakness 08/09/2017  . Neuropathy 08/09/2017  . 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/2017  . Abnormality of gait 07/12/2017  . Myoclonic jerkings, 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/2017  . Acquired right foot drop 07/12/2017  . UTI (urinary tract infection) due to Enterococcus 07/12/2017  . Pressure ulcer 06/21/2017  . Acute cystitis 06/20/2017  . Weakness generalized 06/20/2017  . Dehydration 06/20/2017  . Dyspnea 12/26/2016  . Chronic cough 12/26/2016  . Hypersomnia with sleep apnea 10/30/2016  . CSA (central sleep apnea) 10/30/2016  . Wheezing symptom 10/30/2016  . 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 RLS (restless legs syndrome) 08/25/2014  . Primary gout 08/25/2014  . OSA on CPAP 08/25/2014  . Severe obesity (BMI >= 40) ([place]) 08/25/2014  . Obesity (BMI 30-39.9) 02/18/2013  . 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 S/P TKR (total knee replacement) 11/29/2012  . History of thyroid cancer     Expected Discharge Date: Expected Discharge Date: 11/18/19  Team Members Present: Physician leading conference: Dr. Courtney Heys Care Coodinator Present: Loralee Pacas, LCSWA;Christina Sampson Goon, BSW;Other (comment)(Tripp Goins Creig Hines, RN, BSN, CCRN) Nurse Present: Dorthula Nettles, RN PT Present: Excell Seltzer, PT OT Present: Willeen Cass, OT PPS Coordinator present : Ileana Ladd, Burna Mortimer, SLP     Current Status/Progress  Goal Weekly Team Focus  Bowel/Bladder   Pt has foley and is incontinent of bowel  Pt to become more aware of need to have bowel movement  Assess q shift and prn   Swallow/Nutrition/ Hydration             ADL's              Mobility   PT Evaluation pending  PT Evaluation pending  PT Evaluation pending   Communication             Safety/Cognition/ Behavioral Observations            Pain   Patient complains of pain in lower legs and left ankle  Pain level less than 3  Assess q shift and prn   Skin   Patient has wounds to sacrum and lateral left ankle covered with foam.  Prevent further skin breakdown and promote healing  Assess skin q shift and prn      *See Care Plan and progress notes for long and short-term goals.     Barriers to Discharge  Current Status/Progress Possible Resolutions Date Resolved   Nursing  Wound Care               PT  Medical stability;Other (comments)  pain, spasticity              OT                  SLP                Care Coordinator                Discharge Planning/Teaching Needs:   Patient to discharge home with wife. Education with wife is main priority. Finding appropriate WC for patient to have at discharge. Wound care teaching with wife. Family education as recommended by therapy.    Team Discussion: Nursing will update wound measurements. Will continue Santyl to unstageable wounds on patient bottom. Patient experiencing painful Gout and Spasms.    Revisions to Treatment Plan: MD to order [facility] therapy for wounds on bottom before discharge. Order placed to change therapy for PT, and OT to Q daily.     Medical Summary Current Status: 4x3cm wound- stilll having spasms- Weekly Focus/Goal: OT- EOB this AM- close squat pivot +2- tolerated weight through legs- - L foot an issue due to gout  Barriers to Discharge: Decreased family/caregiver support;Home enviroment access/layout;Incontinence;Neurogenic Bowel & Bladder;Weight;Pending Surgery;Wound care  Barriers to Discharge Comments: needs ITB pump; w/c eval with numotion- horrific spasms- L ankle/sacral wounds- hydrotherapy after santyl Possible Resolutions to Barriers: PT eval- so much pain, couldn't do much/get  up- 1 week?   Continued Need for Acute Rehabilitation Level of Care: The patient requires daily medical management by a physician with specialized training in physical medicine and rehabilitation for the following reasons: Direction of a multidisciplinary physical rehabilitation program to maximize functional independence : Yes Medical management of patient stability for increased activity during participation in an intensive rehabilitation regime.: Yes Analysis of laboratory values and/or radiology reports with any subsequent need for medication adjustment and/or medical intervention. : Yes   I attest that I was present, lead the team conference, and concur with the assessment and plan of the team.   Cristi Loron 11/11/2019, 4:04 PM

## 2019-11-11 NOTE — Evaluation (Signed)
Physical Therapy Assessment and Plan  Patient Details  Name: [name] MRN: [id number] Date of Birth: [birth year]  PT Diagnosis: Abnormal posture, Hypertonia, Impaired sensation, Muscle spasms, Muscle weakness, Paraplegia and Pain in joint Rehab Potential: Fair ELOS: 7-10 days   Today's Date: 11/11/2019 PT Individual Time: 0800-0900 PT Individual Time Calculation (min): 60 min    Problem List:  Patient Active Problem List   Diagnosis Date Noted  . Palliative care by specialist   . DNR (do not resuscitate)   . Acute on chronic respiratory failure with hypoxia ([place]) 10/29/2019  . Incomplete paraplegia ([place]) 10/28/2019  . Labile blood pressure   . Spasticity   . Muscle spasm   . Hypoxia   . Supplemental oxygen dependent   . Neurogenic bladder   . Chronic diastolic congestive heart failure ([place])   . Neurogenic bowel   . Hypocalcemia   . Spinal stenosis of lumbar region   . Cervical myelopathy ([place]) 09/26/2019  . Diabetes mellitus type 2, controlled, with complications ([place]) 40/98/1191  . Obese 09/17/2019  . Thoracic aortic aneurysm ([place]) 09/17/2019  . Bowel incontinence 09/17/2019  . Bladder incontinence 09/17/2019  . Right hemiparesis ([place]) 09/17/2019  . Acute renal failure (ARF) ([place]) 09/17/2019  . Left hemiparesis ([place]) 09/17/2019  . Chronic respiratory failure with hypoxia ([place]) 09/17/2019  . Syncope and collapse 09/17/2019  . Chronic diastolic CHF (congestive heart failure) ([place]) 09/04/2019  . PVD (peripheral vascular disease) ([place]) 02/14/2019  . Essential hypertension 02/13/2019  . Non-insulin treated type 2 diabetes mellitus ([place]) 02/13/2019  . Severe sepsis ([place]) 01/17/2019  . Community acquired pneumonia of right lower lobe of lung 01/17/2019  . Postural urinary incontinence 05/20/2018  . Myelopathy concurrent with and due to spinal stenosis of cervical region ([place]) 05/20/2018  . Myoclonic jerking 05/20/2018  . 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 History of respiratory failure 08/18/2017  . Hypokalemia 08/18/2017  . Leukocytosis 08/18/2017  . Muscle atrophy of lower extremity 08/09/2017  . Bowel and bladder incontinence 08/09/2017  . Right-sided muscle weakness 08/09/2017  . Neuropathy 08/09/2017  . 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/2017  . Abnormality of gait 07/12/2017  . Myoclonic jerkings, 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/2017  . Acquired right foot drop 07/12/2017  . UTI (urinary tract infection) due to Enterococcus 07/12/2017  . Pressure ulcer 06/21/2017  . Acute cystitis 06/20/2017  . Weakness generalized 06/20/2017  . Dehydration 06/20/2017  . Dyspnea 12/26/2016  . Chronic cough 12/26/2016  . Hypersomnia with sleep apnea 10/30/2016  . CSA (central sleep apnea) 10/30/2016  . Wheezing symptom 10/30/2016  . 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 RLS (restless legs syndrome) 08/25/2014  . Primary gout 08/25/2014  . OSA on CPAP 08/25/2014  . Severe obesity (BMI >= 40) ([place]) 08/25/2014  . Obesity (BMI 30-39.9) 02/18/2013  . 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 S/P TKR (total knee replacement) 11/29/2012  . History of thyroid cancer     Past Medical History:  Past Medical History:  Diagnosis Date  . Arthritis   . Barrett esophagus   . Bronchitis   . Carotid artery occlusion   . COPD (chronic obstructive pulmonary disease) ([place])   . Diabetes mellitus without complication ([place])   . GERD (gastroesophageal reflux disease)   . Headache    migraines  . History of thyroid cancer 2008  . Hypertension   . Restless leg   . Sleep apnea  with use of continuous positive airway pressure (CPAP)    2011 [facility] , AHI  77cn central and obstrcutive. 16 cm water , 3 cm EPR.   . Thoracic ascending aortic aneurysm ([facility])    4.2 cm ascending TAA 08/18/17 CTA. Annual imaging recommended.  . thyroid ca dx'd 2009 or 2010   surg and  radioactive isoptope  . Ulcer    Peptic ulcer disease   Past Surgical History:  Past Surgical History:  Procedure Laterality Date  . ANTERIOR CERVICAL DECOMP/DISCECTOMY FUSION N/A 09/03/2017   Procedure: ACDF - C3-C4 - C4-C5 - C5-C6;  Surgeon: Kary Kos, MD;  Location: [facility];  Service: Neurosurgery;  Laterality: N/A;  . CAROTID ENDARTERECTOMY Left January 03, 2006   Dr. Amedeo Plenty  . COLONOSCOPY    . EYE SURGERY Bilateral    cataract surgery with lens implants  . game keepers thumb Right   . JOINT REPLACEMENT Left    knee X 2  . LUMBAR LAMINECTOMY/ DECOMPRESSION WITH MET-RX N/A 11/04/2019   Procedure: LUMBAR THREE-FOUR LUMBAR LAMINECTOMY/ DECOMPRESSION WITH MET-RX;  Surgeon: Judith Part, MD;  Location: [facility];  Service: Neurosurgery;  Laterality: N/A;  . RADIOLOGY WITH ANESTHESIA N/A 10/16/2019   Procedure: MRI THORACIC LUMBAR SPINE;  Surgeon: Radiologist, Medication, MD;  Location: [facility];  Service: Radiology;  Laterality: N/A;  . ROTATOR CUFF REPAIR Right   . THYROIDECTOMY  2008    Assessment & Plan Clinical Impression:  [name] is a 79 year old right-handed male with history of chronic diastolic congestive heart failure, hypertension, diabetes mellitus, OSA on CPAP with 2 L of oxygen at home and quit smoking 14 years ago, chronic restless leg syndrome maintained on Sinemet, myoclonic jerking maintained on Keppra, thoracic ascending aortic aneurysm 4.2 cm 08/18/2017, thyroid cancer, obesity BMI 34.84, myelopathy with C-spine cord with cervical lumbar radiculopathy and left hemiparesis spasticity status post ACDF 09/03/2017 followed by Dr. Brett Fairy of neurology services as well as neurosurgery, bowel bladder incontinence. Per chart review lives with spouse 1 level home with ramped entrance. Needed assistance for dressing as well as transfers. He has a PCA who does provide assistance. He was able to ambulate short household distances with a walker. Presented 09/17/2019 with generalized  fatigue over the last couple of days and decrease in functional mobility. Cranial CT scan was negative for acute changes. Admission chemistries BUN 43, creatinine 1.64, WBC 13,000, troponin negative, urinalysis negative nitrite with urine culture 30,000 diphtheroids, blood cultures no growth to date. MRI thoracic lumbar cervical spine showed chronic changes with patchy cord signal abnormality at the C3-4 level consistent with chronic myelomalacia. Multilevel degenerative disc disease of the lower thoracic spine without high-grade spinal stenosis as well as lumbar spondylosis and congenitally short pedicles causing prominent impingement at L2-3 and 3-4 with moderate impingement L1-2 and 4-5 as well as T12-L1 and L5-S1. Impingement of L1-2 and 2-3 minimally worsened from prior exam 07/24/2017. Neurosurgery Dr. Kristeen Miss consulted in regards to MRI findings patient not felt to be a surgical candidate advised IV Solu-Medrol then transition to prolonged prednisone with taper. Echocardiogram with ejection fraction 60% no wall motion abnormalities. Chronic pain control ongoing with the use of scheduled baclofen titrated to 15 mg 3 times daily as well as nightly Neurontin and OxyContin 10 mg every 12 hours. Therapy evaluations completed and patient was admitted to inpatient rehab services 09/26/2019 with slow overall progress. Ongoing care of patient's spasticity with follow-up recommendations followed by neurology as well as neurosurgery and  considerations were even made for a possible baclofen pump. Follow-up cervical lumbar thoracic myelogram completed showing severe degenerative with mild scoliosis and listhesis as well as L3-4 severe spinal stenosis L2-3 moderate to advanced spinal stenosis L1-2 moderate spinal stenosis. Resolved cord compression after ACDF. There was some bilateral foraminal narrowing noted. Thoracic spine diffuse degenerative disease without cord compression. Neurosurgery again  discussed at length with family in regards to his weakness spasticity myelogram findings recommendations for L3-4 decompression. During patient's rehabilitation stay he did remain on Lovenox for DVT prophylaxis OSA with CPAP chronic oxygen monitoring followed by pulmonary services. Wound care consulted 10/23/2019 for left ankle ulcer as well as left buttock with wound care as directed. Left ankle films were completed showing no evidence of osteomyelitis fracture or other acute abnormality no visible abscess. Patient was transferred to acute care services 10/28/2019 and underwent lumbar 3-4 laminectomy decompression 11/04/2019 per Dr. Venetia Constable. Palliative care was consulted to establish goals of care. He was placed on subcutaneous heparin for DVT prophylaxis. CPAP ongoing as well as oxygen needs for history of OSA/chronic respiratory failure. Wound care ongoing for left buttock sacral wound and left lateral ankle wound. Therapy evaluations completed and patient was admitted for a comprehensive rehab program. Patient transferred to CIR on 11/10/2019 .   Patient currently requires total with mobility secondary to muscle weakness and muscle joint tightness, decreased cardiorespiratoy endurance and decreased oxygen support, abnormal tone and unbalanced muscle activation and decreased sitting balance, decreased standing balance, decreased postural control and decreased balance strategies.  Prior to hospitalization, patient was min with mobility and lived with Spouse in a House home.  Home access is  Ramped entrance.  Patient will benefit from skilled PT intervention to maximize safe functional mobility, minimize fall risk and decrease caregiver burden for planned discharge home with 24 hour assist.  Anticipate patient will benefit from follow up [facility] at discharge.  PT - End of Session Activity Tolerance: Tolerates 30+ min activity with multiple rests Endurance Deficit: Yes Endurance Deficit Description:  frequent rest breaks 2/2 pain and fatigue PT Assessment Rehab Potential (ACUTE/IP ONLY): Fair PT Barriers to Discharge: Medical stability;Other (comments) PT Barriers to Discharge Comments: pain, spasticity PT Patient demonstrates impairments in the following area(s): Balance;Endurance;Motor;Pain;Safety;Sensory PT Transfers Functional Problem(s): Bed Mobility;Bed to Chair;Car;Furniture;Floor PT Locomotion Functional Problem(s): Ambulation;Wheelchair Mobility;Stairs PT Plan PT Intensity: (QD) PT Frequency: (QD; one hour of PT and one hour of OT/day) PT Duration Estimated Length of Stay: 7-10 days PT Treatment/Interventions: Balance/vestibular training;Discharge planning;DME/adaptive equipment instruction;Functional mobility training;Neuromuscular re-education;Pain management;Patient/family education;Psychosocial support;Skin care/wound management;Splinting/orthotics;Therapeutic Activities;Therapeutic Exercise;UE/LE Strength taining/ROM;UE/LE Coordination activities;Wheelchair propulsion/positioning PT Transfers Anticipated Outcome(s): dependent via lift equipment PT Locomotion Anticipated Outcome(s): Supervision for power wheelchair mobility PT Recommendation Recommendations for Other Services: Neuropsych consult;Therapeutic Recreation consult Therapeutic Recreation Interventions: Stress management Follow Up Recommendations: Home health PT;24 hour supervision/assistance Patient destination: Home Equipment Recommended: Wheelchair (measurements);Wheelchair cushion (measurements);Other (comment)(manual hoyer lift, hospital bed) Equipment Details: pt already owns hoyer lift, custom w/c TBD  Skilled Therapeutic Intervention Evaluation completed (see details above and below) with education on PT POC and goals and individual treatment initiated with focus on functional bed mobility assessment, reorientation to rehab unit schedule and goals, and discussion with patient and MD with regards to current  goals during this rehab admission. Pt received seated in bed attempting to eat breakfast and take his AM medications. Pt reports ongoing pain in BLE due to spasticity and onset of gout. Pt receives pain medication as well as medication to manage spasticity at  beginning of session, declines further intervention. Pt agreeable to attempt to transfer out of bed to a chair in order to finish eating breakfast. Pt is assist x 2 for rolling L/R for dependent donning of brief and pants. Pt requires significantly increased time to complete LB dressing due to frequency of spasms and flexor tone in BLE. Once pants donned pt fatigued and due to time constraints unable to perform transfer to chair this session. Pt left seated in bed setup to finish his breakfast. Pt on 2L O2 throughout session, SpO2 remains at 89% and above with activity.  PT Evaluation Precautions/Restrictions Precautions Precautions: Fall;Back Precaution Comments: L lateral ankle ulcer, L buttock/sacral ulcer Restrictions Weight Bearing Restrictions: No Home Living/Prior Functioning Home Living Available Help at Discharge: Family;Available 24 hours/day;Personal care attendant Type of Home: House Home Access: Ramped entrance Home Layout: One level Additional Comments: prior to hospital admission had PCA to assist with dressing and transfers  Lives With: Spouse Prior Function Level of Independence: Needs assistance with gait;Needs assistance with tranfers  Able to Take Stairs?: No Driving: No Vision/Perception  Perception Perception: Within Functional Limits Praxis Praxis: Intact  Cognition Overall Cognitive Status: Within Functional Limits for tasks assessed Arousal/Alertness: Awake/alert Orientation Level: Oriented X4 Attention: Focused;Sustained Focused Attention: Appears intact Sustained Attention: Appears intact Memory: Appears intact Awareness: Appears intact Problem Solving: Appears intact Safety/Judgment: Appears  intact Sensation Sensation Light Touch: Impaired Detail Light Touch Impaired Details: Impaired RLE;Impaired RUE Hot/Cold: Impaired Detail Hot/Cold Impaired Details: Impaired RUE;Impaired LUE;Impaired RLE;Impaired LLE Proprioception: Impaired Detail Proprioception Impaired Details: Impaired RLE;Impaired LLE Coordination Gross Motor Movements are Fluid and Coordinated: No Fine Motor Movements are Fluid and Coordinated: No Coordination and Movement Description: impaired due to B LE paresis, impaired balance, and poor activity tolerance Motor  Motor Motor: Abnormal tone;Abnormal postural alignment and control;Other (comment)(BLE spasticity) Motor - Skilled Clinical Observations: ongoing BLE spasticity limiting functional mobility  Mobility Bed Mobility Bed Mobility: Rolling Right;Rolling Left Rolling Right: 2 Helpers Rolling Left: 2 Helpers Supine to Sit: 2 Helpers Sit to Supine: 2 Helpers Transfers Transfers: Corporate treasurer Transfers: 2 Geologist, engineering / Additional Locomotion Stairs: No Wheelchair Mobility Wheelchair Mobility: No  Trunk/Postural Assessment  Cervical Assessment Cervical Assessment: Exceptions to WFL(forward head) Thoracic Assessment Thoracic Assessment: Exceptions to WFL(kyphotic) Lumbar Assessment Lumbar Assessment: Exceptions to WFL(posterior pelvic tilt) Postural Control Postural Control: Deficits on evaluation Postural Limitations: decreased with R lateral lean with fatigue  Balance Balance Balance Assessed: Yes Static Sitting Balance Static Sitting - Balance Support: Feet supported;Bilateral upper extremity supported Static Sitting - Level of Assistance: 1: +2 Total assist Dynamic Sitting Balance Dynamic Sitting - Balance Support: Feet supported;During functional activity Dynamic Sitting - Level of Assistance: 1: +2 Total assist Extremity Assessment   RLE Assessment RLE Assessment: Exceptions to [facility] Passive Range of  Motion (PROM) Comments: tight 2/2 tone Active Range of Motion (AROM) Comments: impaired 2/2 tone General Strength Comments: impaired, not formally tested 2/2 tone LLE Assessment LLE Assessment: Exceptions to [facility] Passive Range of Motion (PROM) Comments: tight 2/2 tone Active Range of Motion (AROM) Comments: impaired 2/2 tone General Strength Comments: impaired, not formally tested 2/2 tone    Refer to Care Plan for Long Term Goals  Recommendations for other services: Neuropsych and Therapeutic Recreation  Stress management  Discharge Criteria: Patient will be discharged from PT if patient refuses treatment 3 consecutive times without medical reason, if treatment goals not met, if there is a change in medical status, if patient  makes no progress towards goals or if patient is discharged from hospital.  The above assessment, treatment plan, treatment alternatives and goals were discussed and mutually agreed upon: by patient   Excell Seltzer, PT, DPT 11/11/2019, 12:49 PM

## 2019-11-11 NOTE — Progress Notes (Signed)
Occupational Therapy Session Note  Patient Details  Name: [name] MRN: [id number] Date of Birth: 1941/04/17  Today's Date: 11/11/2019 OT Individual Time: 1305-1400 OT Individual Time Calculation (min): 55 min    Short Term Goals: No STGs  Skilled Therapeutic Interventions/Progress Updates:    Pt seen this session to problem solve a safe transfer for pt to participate in to reduce pressure on his L foot and bottom.   At start of session pt in wc c/o sacral area pain and c/o that he is starving because he cannot chew or swallow the food as it is too tough.  Cut the chicken in very small diced pieces and it did not feel tough but pt c/o it was still too difficult to chew. He did not want any rice or broccoli. Discussed having him on a softer diet but he c/o that was not good either.  Offered a magic cup, yogurt, ensure but pt declined. He did agree to whole milk.  Provided pt with milk and then discussed objective of this session.. Pt in agreement to try.   Placed a 2 inch step under his feet for support as wc seat is high. Positioned a bed pad under his hips with pt assisting with leaning. Placed breezy board under the pad for a softer surface and to allow the pad to function as a 'sling' under his hips. Pt then actively used his hands to move himself on board along with max A to support his trunk with +2 A for safety.  Once on EOB, pt held his posture with mod A.  Moved into sidelying then supine with max A of 2 to ensure back precautions. Pt tolerated all of this movement quite well. Adjusted pt in bed, pt with increased LE spasms and attempted to stretch his legs out but his spasms were very tight.  Positioned pillow under R calf but pt did not want one under L calf.    Pt adjusted in bed with all needs met, 4 rails up and alarm on. Call light and drinks in place.   Therapy Documentation Precautions:  Precautions Precautions: Fall, Back Precaution Comments: L lateral ankle ulcer, L  buttock/sacral ulcer Restrictions Weight Bearing Restrictions: No    Vital Signs: Therapy Vitals Temp: 98.2 F (36.8 C) Pulse Rate: 69 Resp: 17 BP: (!) 92/57 Patient Position (if appropriate): Lying Oxygen Therapy SpO2: 91 % O2 Device: Room Air Pain:    Pt c/o multiple pain sites (buttocks, back, L foot) but it did not impede his ability to participate - premedicated  Therapy/Group: Individual Therapy  [place] 11/11/2019, 3:03 PM

## 2019-11-12 ENCOUNTER — Inpatient Hospital Stay (HOSPITAL_COMMUNITY): Payer: Medicare Other

## 2019-11-12 ENCOUNTER — Inpatient Hospital Stay (HOSPITAL_COMMUNITY): Payer: Medicare Other | Admitting: Physical Therapy

## 2019-11-12 NOTE — Progress Notes (Signed)
[facility] PHYSICAL MEDICINE & REHABILITATION PROGRESS NOTE   Subjective/Complaints:   L foot a little better- overall better this AM- doesn't feel drugged anymore- think could have ben Keppra- stopped it yesterday.   Also frustrated it took awhile to find inhalers, per pt.    ROS:  Pt denies SOB, abd pain, CP, N/V/C/D, and vision changes   Objective:   DG CHEST PORT 1 VIEW  Result Date: 11/10/2019 CLINICAL DATA:  Follow-up pneumonia. Productive cough. EXAM: PORTABLE CHEST 1 VIEW COMPARISON:  Chest x-rays dated 10/29/2019 and 10/27/2019 FINDINGS: There has been significant reduction in the left pleural effusion with slight residual left effusion and slight elevation of the left hemidiaphragm and probable slight atelectasis at the left base. There is slight thickening along the fissures in the right lung with minimal atelectasis at the right base medially. The heart size and pulmonary vascularity are normal. No acute bone abnormality. IMPRESSION: 1. Significant reduction in the left pleural effusion, now small. 2. Slight residual atelectasis at the left lung base. Electronically Signed   By: Lorriane Shire M.D.   On: 11/10/2019 10:19   Recent Labs    11/10/19 2001 11/11/19 0737  WBC 13.3* 10.9*  HGB 11.4* 12.2*  HCT 35.6* 38.2*  PLT 269 272   Recent Labs    11/10/19 2001 11/11/19 0737  NA  --  140  K  --  4.0  CL  --  98  CO2  --  31  GLUCOSE  --  85  BUN  --  20  CREATININE 0.94 0.75  CALCIUM  --  9.4    Intake/Output Summary (Last 24 hours) at 11/12/2019 0913 Last data filed at 11/12/2019 0530 Gross per 24 hour  Intake 467 ml  Output 1550 ml  Net -1083 ml     Physical Exam: Vital Signs Blood pressure 122/71, pulse 72, temperature (!) 97 F (36.1 C), resp. rate 18, height 5\' 7"  (1.702 m), weight 99.8 kg, SpO2 98 %.   Physical Exam General: still having bad spasms- but laying in bed; appropriate, NAD Appropriate affect Heart: RRR Lungs: pre-nebs- very  coarse- throughout- sats 89-93% on RA- off CPAP- adequate air movement heard Abdomen: Soft, NT, ND, (+)BS  Extremities: No clubbing, cyanosis, or edema Skin: Sacral decubitus irregular  ~10cm at widest point stages 1, 3 and unstageable portion under eschar     Neurologic: Cranial nerves II through XII intact, motor strength is 4/5 in bilateral deltoid, bicep, tricep, grip,  Difficult to perform MMT in LEs had increase knee flexor tone limiting ROM Severe LE spasms- legs drawn up somewhat Sensory exam normal sensation to light touch lower extremities Musculoskeletal: Bilateral hip flexor and knee flexor contracture vs MAS 4 spasticity- hamstrings very tight    Assessment/Plan: 1. 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  Physiatrist is providing close team supervision and 24 hour management of active medical problems listed below.  Physiatrist and rehab team continue to assess barriers to discharge/monitor patient progress toward functional and medical goals  Care Tool:  Bathing    Body parts bathed by patient: Chest, Abdomen, Face   Body parts bathed by helper: Right arm, Left arm, Front perineal area, Buttocks, Right upper leg, Left upper leg, Right lower leg, Left lower leg     Bathing assist Assist Level: Total Assistance - Patient <  25%     Upper Body Dressing/Undressing Upper body dressing   What is the patient wearing?: Pull over shirt    Upper body assist Assist Level: Maximal Assistance - Patient 25 - 49%    Lower Body Dressing/Undressing Lower body dressing      What is the patient wearing?: Pants     Lower body assist Assist for lower body dressing: Dependent - Patient 0%     Toileting Toileting    Toileting assist Assist for toileting:  Dependent - Patient 0%     Transfers Chair/bed transfer  Transfers assist     Chair/bed transfer assist level: 2 Helpers     Locomotion Ambulation   Ambulation assist   Ambulation activity did not occur: Safety/medical concerns          Walk 10 feet activity   Assist  Walk 10 feet activity did not occur: Safety/medical concerns        Walk 50 feet activity   Assist Walk 50 feet with 2 turns activity did not occur: Safety/medical concerns         Walk 150 feet activity   Assist Walk 150 feet activity did not occur: Safety/medical concerns         Walk 10 feet on uneven surface  activity   Assist Walk 10 feet on uneven surfaces activity did not occur: Safety/medical concerns         Wheelchair     Assist Will patient use wheelchair at discharge?: Yes Type of Wheelchair: (TBD) Wheelchair activity did not occur: Safety/medical concerns         Wheelchair 50 feet with 2 turns activity    Assist    Wheelchair 50 feet with 2 turns activity did not occur: Safety/medical concerns       Wheelchair 150 feet activity     Assist  Wheelchair 150 feet activity did not occur: Safety/medical concerns       Blood pressure 122/71, pulse 72, temperature (!) 97 F (36.1 C), resp. rate 18, height 5\' 7"  (1.702 m), weight 99.8 kg, SpO2 98 %.  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 Status post L3-4 lumbar laminectomy decompression -patient may  shower -ELOS/Goals: one person assist ADL and mobility goals, heal skin to stage 2 or better if possible- hard to keep pt on side due to spasms  5/25- asked to get w/c eval; done asap since 1 of reaosns pt back to rehab/CIR 2. Antithrombotics: -DVT/anticoagulation:Subcutaneous heparin. Recent venous Doppler studies negative.  5/25- changed to  lovenox since renal fxn ok -antiplatelet therapy: Aspirin 81 mg daily 3. Pain Management:Baclofen 30 mg 3 times daily and 40 mg nightly, Zanaflex 2 mg 3 times daily, Neurontin 100 mg nightly, hydrocodone and Robaxin as needed. Imitrex as needed for headaches Would consider Botox 150-200 Units each hamstring  5/26- will try to get it done- either here or outpt.  4. Mood:Trazodone as needed -antipsychotic agents: N/A 5. Neuropsych: This patientiscapable of making decisions on hisown behalf. 6. Skin/Wound Care:Apply Santyl ointment to left buttock sacral wound and left lateral ankle wound in a nickel thick layer. Cover with saline moistened gauze then sized appropriate foam dressing changed daily, as discussed with PT/Wound care may benefit from hydrotherapy after 1-2 wks of colllagenase   5/25- also need to assess L ankle 7. Fluids/Electrolytes/Nutrition:Routine in and outs with follow-up chemistries 8. Chronic restless leg syndrome. Sinemet 10-100 mg twice daily 9.  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  10. OSA/chronic respiratory failure. Continue CPAP and oxygen as directed with nebulizer treatments  5/25- add duonebs at 8am in morning 11. Neurogenic bowel bladder. Flomax 0.8 mg daily. Check PVR. 12. Hypothyroidism. Synthroid 13. 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 Follow-up outpatient 14. Chronic diastolic congestive heart failure. Monitor for any signs of fluid overload. Lasix 40 mg daily. Continue Aldactone 25 mg daily 15. Hypertension. Toprol-XL 50 mg daily.  5/26- BP well controlled- con't meds/regimen 16. Hyperlipidemia. Lipitor 17. Gout. Zyloprim 300 mg daily.  5/25- change to colchicine since acute gout Sx' and add Indomethacin 50 mg BID with meals for pain x 10 doses  5/26- gout Sx's  somewhat better- cont regimen     LOS: 2 days A FACE TO FACE EVALUATION WAS PERFORMED  Teala Daffron 11/12/2019, 9:13 AM

## 2019-11-12 NOTE — Progress Notes (Signed)
Patient had questions about getting the ear wax removed from his ears? This RN did not have an answer, told him to ask Dr. Dagoberto Ligas when he sees her again. Patient also feels the Zanaflex is what is causing him to feel "drugged" and "knocks him out". Patient requested to no longer be given the Zanaflex to see if it makes a difference.

## 2019-11-12 NOTE — Progress Notes (Signed)
Physical Therapy Session Note  Patient Details  Name: [name] MRN: [id number] Date of Birth: 04-Jun-1941  Today's Date: 11/12/2019 PT Individual Time: 0900-0955 PT Individual Time Calculation (min): 55 min   Short Term Goals: Week 1:  PT Short Term Goal 1 (Week 1): =LTG due to ELOS  Skilled Therapeutic Interventions/Progress Updates:    Pt received semi-reclined in bed, agreeable to PT session. Pt reports ongoing pain in LLE from gout and due to spasticity, pain not rated. Assisted pt with repositioning in bed for improved comfort and pain management. Pt is mod A for rolling onto R side for improved comfort and to get off of wound on buttock/sacrum. Pt's wife not present during session but via phone able to engage pt and his wife in a conversation with regards to d/c plan, equipment patient will require upon d/c home, type of wheelchair they would prefer (TIS vs PWC), and family education and hands-on training to be completed prior to d/c home next week. Pt and his wife state they would prefer power w/c mobility for decreased burden on pt's wife with regards to assisting the patient and to allow the pt greater independence with functional mobility. Obtained power w/c and set it up for patient to trial next PT session. Pt stating some concerns about doorway width when entering the home as that is the narrowest doorway that they have, pt's wife measures this doorway to be 29". Also discussed recommendation of use of a hospital bed upon d/c home for skin integrity management, ease of caregiver and patient burden with ADLs, increased safety with transfers, etc. Pt reports he does not want a hospital bed at home but understanding of education and will continue discussion with pt and his family. Pt left in R sidelying in bed with needs in reach at end of session. Pt on 2L O2 during therapy session, SpO2 remains at 90% and above.  Therapy Documentation Precautions:  Precautions Precautions: Fall,  Back Precaution Booklet Issued: Yes (comment) Precaution Comments: L lateral ankle ulcer, L buttock/sacral ulcer Restrictions Weight Bearing Restrictions: No    Therapy/Group: Individual Therapy   Excell Seltzer, PT, DPT  11/12/2019, 12:22 PM

## 2019-11-12 NOTE — Progress Notes (Signed)
Gave patient one tablet of Norco after patient requested pain pill for a 7/10 pain.

## 2019-11-12 NOTE — Progress Notes (Signed)
Occupational Therapy Session Note  Patient Details  Name: [name] MRN: [id number] Date of Birth: 04/24/1941  Today's Date: 11/12/2019 OT Individual Time: 1300-1355 OT Individual Time Calculation (min): 55 min    Short Term Goals: Week 1:  OT Short Term Goal 1 (Week 1): STG = LTG 2/2 ELOS  Skilled Therapeutic Interventions/Progress Updates:    1:1. Pt received in bed agreeable to OT. Pt very crooked in bed requiring increased time to reposition for comfort in the middle of the bed. Pt lies on back with HOB elevated and pt completes 2x10 therex chest press, shoulder raise, and elbow flex/ext with 2# dowel rod. Pt reportsing discomfort at buttocks near sore, and pt rolls into semi sidelying with pillow under L hip for pressure relief. Pt applies poligrip to lower dentures with A to hold denture. OT elevates HOB and sets up pt with lunch tray. Pt requires increased time to complete self feeding and A for managing packages and containers. Exited session with pt still eating in bed, exit alarm on and call light in reach  Therapy Documentation Precautions:  Precautions Precautions: Fall, Back Precaution Booklet Issued: Yes (comment) Precaution Comments: L lateral ankle ulcer, L buttock/sacral ulcer Restrictions Weight Bearing Restrictions: No General:   Vital Signs:   Pain:   ADL: ADL Eating: Set up Where Assessed-Eating: Bed level Grooming: Minimal assistance Where Assessed-Grooming: Chair Upper Body Bathing: Dependent Where Assessed-Upper Body Bathing: Bed level Lower Body Bathing: Dependent Where Assessed-Lower Body Bathing: Bed level Upper Body Dressing: Maximal assistance Where Assessed-Upper Body Dressing: Bed level Lower Body Dressing: Dependent Where Assessed-Lower Body Dressing: Bed level Toilet Transfer: Unable to assess Toilet Transfer Method: Unable to assess Tub/Shower Transfer: Unable to assess Tub/Shower Transfer Method: Unable to assess Vision    Perception    Praxis   Exercises:   Other Treatments:     Therapy/Group: Individual Therapy  Tonny Branch 11/12/2019, 1:15 PM

## 2019-11-12 NOTE — Plan of Care (Signed)
  Problem: RH SAFETY Goal: RH STG ADHERE TO SAFETY PRECAUTIONS W/ASSISTANCE/DEVICE Description: STG Adhere to Safety Precautions With Assistance/Device. 11/12/2019 1104 by Debroah Loop D, RN Outcome: Progressing 11/12/2019 1104 by Tennis Ship, RN Outcome: Not Progressing Goal: RH STG DECREASED RISK OF FALL WITH ASSISTANCE Description: STG Decreased Risk of Fall With Assistance. 11/12/2019 1104 by Debroah Loop D, RN Outcome: Progressing 11/12/2019 1104 by Debroah Loop D, RN Outcome: Not Progressing   Problem: RH PAIN MANAGEMENT Goal: RH STG PAIN MANAGED AT OR BELOW PT'S PAIN GOAL 11/12/2019 1104 by Tennis Ship, RN Outcome: Progressing 11/12/2019 1104 by Tennis Ship, RN Outcome: Not Progressing

## 2019-11-13 ENCOUNTER — Inpatient Hospital Stay (HOSPITAL_COMMUNITY): Payer: Medicare Other | Admitting: Occupational Therapy

## 2019-11-13 ENCOUNTER — Inpatient Hospital Stay (HOSPITAL_COMMUNITY): Payer: Medicare Other | Admitting: Physical Therapy

## 2019-11-13 MED ORDER — CARBAMIDE PEROXIDE 6.5 % OT SOLN
5.0000 [drp] | Freq: Two times a day (BID) | OTIC | Status: AC
  Administered 2019-11-13 – 2019-11-17 (×10): 5 [drp] via OTIC
  Filled 2019-11-13 (×2): qty 15

## 2019-11-13 NOTE — IPOC Note (Signed)
Overall Plan of Care [facility]) Patient Details Name: [name] MRN: [id number] DOB: 1940/09/19  Admitting Diagnosis: Cervical myelopathy [facility])  Hospital Problems: Principal Problem:   Cervical myelopathy ([place]) Active Problems:   Pressure ulcer   Spinal stenosis of lumbar region   Spasticity   Incomplete paraplegia [facility])     Functional Problem List: Nursing Bladder, Bowel, Pain, Safety, Skin Integrity  PT Balance, Endurance, Motor, Pain, Safety, Sensory  OT Balance, Endurance, Motor, Pain, Sensory  SLP    TR         Basic ADL's: OT Grooming, Bathing, Dressing, Toileting     Advanced  ADL's: OT       Transfers: PT Bed Mobility, Bed to Chair, Car, Furniture, Futures trader, Metallurgist: PT Ambulation, Emergency planning/management officer, Stairs     Additional Impairments: OT    SLP        TR      Anticipated Outcomes Item Anticipated Outcome  Self Feeding    Swallowing      Basic self-care  Max A at bed level  Toileting  Max A at bed level   Bathroom Transfers N/A  Bowel/Bladder  Max assist  Transfers  dependent via Engineer, manufacturing for power wheelchair mobility  Communication     Cognition     Pain  < 3  Safety/Judgment  Mod 1 assist   Therapy Plan: PT Intensity: (QD) PT Frequency: (QD; one hour of PT and one hour of OT/day) PT Duration Estimated Length of Stay: 7-10 days OT Intensity: Minimum of 1-2 x/day, 45 to 90 minutes OT Frequency: 5 out of 7 days OT Duration/Estimated Length of Stay: 7-10 days     Due to the current state of emergency, patients may not be receiving their 3-hours of Medicare-mandated therapy.   Team Interventions: Nursing Interventions Patient/Family Education, Bladder Management, Skin Care/Wound Management, Pain Management  PT interventions Balance/vestibular training, Discharge planning, DME/adaptive equipment instruction, Functional mobility training, Neuromuscular re-education, Pain  management, Patient/family education, Psychosocial support, Skin care/wound management, Splinting/orthotics, Therapeutic Activities, Therapeutic Exercise, UE/LE Strength taining/ROM, UE/LE Coordination activities, Wheelchair propulsion/positioning  OT Interventions Balance/vestibular training, Discharge planning, Self Care/advanced ADL retraining, Therapeutic Exercise, Wheelchair propulsion/positioning, DME/adaptive equipment instruction, UE/LE Strength taining/ROM, Patient/family education, UE/LE Coordination activities, Functional mobility training, Therapeutic Activities, Pain management  SLP Interventions    TR Interventions    SW/CM Interventions Discharge Planning, Psychosocial Support, Patient/Family Education   Barriers to Discharge MD  Home enviroment access/loayout, Incontinence, Neurogenic bowel and bladder, Wound care, Lack of/limited family support and paraplegia- incomplete with severe spasticity  Nursing Wound Care    PT Medical stability, Other (comments) pain, spasticity  OT Incontinence, Wound Care, Home environment access/layout    SLP      SW Decreased caregiver support, Lack of/limited family support     Team Discharge Planning: Destination: PT-Home ,OT- Home , SLP-  Projected Follow-up: PT-Home health PT, 24 hour supervision/assistance, OT-  Home health OT, 24 hour supervision/assistance, SLP-  Projected Equipment Needs: PT-Wheelchair (measurements), Wheelchair cushion (measurements), Other (comment)(manual hoyer lift, hospital bed), OT- To be determined, SLP-  Equipment Details: PT-pt already owns hoyer lift, custom w/c TBD, OT-owns commode, shower seat, assistive devices Patient/family involved in discharge planning: PT- Patient,  OT-Patient, Family member/caregiver, SLP-   MD ELOS: 7-10 days Medical Rehab Prognosis:  Fair Assessment: Pt is a 79 yr old male with cervical myelopathy and lumbar stenosis s/p lami/decompression with slightly increased LE strength-has  Stage  III sacral ulcer and L ankle ulcer- also has COPD- OSA- on CPAP , distolic CHF, and acute gout currently-   Goals family teaching, W/C evaluation and max assist overall   See Team Conference Notes for weekly updates to the plan of care

## 2019-11-13 NOTE — Progress Notes (Signed)
Patient states " I want to be a DNR, I do not want CPR, I do not want to be shocked, I do not want life support".  Current code status is listed as Full code. Danella Sensing ANP-C notified about current code status and was made aware of patient([name]) wishes to be a DNR. I witnessed Danella Sensing ANP-C speak with patient via telephone in reference to code status. Pt provided his full name and DOB. Pt asked how he would like the medical staff to proceed in his care in case of an emergency. Pt states " I want to be a DNR, I was already a DNR, I don't understand how this was changed to a Full code".

## 2019-11-13 NOTE — Progress Notes (Signed)
Occupational Therapy Session Note  Patient Details  Name: [name] MRN: [id number] Date of Birth: Nov 01, 1940  Today's Date: 11/13/2019 OT Individual Time: 0900-0930 OT Individual Time Calculation (min): 30 min  and Today's Date: 11/13/2019 OT Missed Time: 30 Minutes Missed Time Reason: Pain   Short Term Goals: Week 1:  OT Short Term Goal 1 (Week 1): STG = LTG 2/2 ELOS  Skilled Therapeutic Interventions/Progress Updates:    Upon entering the room, pt supine in bed and has tilted self into trendelenburg position. OT asking pt if he was okay and he replied with , " I'm not doing well today". Pt tilting self in an attempt to relieve pain in buttocks. OT offering to assist with side lying positioning. Pt rolling to the R with mod A and use of bed rail. OT placed pillows to keep pt onto side and between knees for comfort. Pt replied position felt much better. He also verbalized that he felt "drugged" and he got pain medication 10 minutes prior to therapist arrival to room. Pt declined further therapeutic intervention at this time secondary to pain and remains in side lying with call bell and all needed items within reach. 30 missed minutes secondary to increased pain at this time.  Therapy Documentation Precautions:  Precautions Precautions: Fall, Back Precaution Booklet Issued: Yes (comment) Precaution Comments: L lateral ankle ulcer, L buttock/sacral ulcer Restrictions Weight Bearing Restrictions: No General: General OT Amount of Missed Time: 30 Minutes Vital Signs: Therapy Vitals Pulse Rate: 73 Resp: 18 Patient Position (if appropriate): Lying Oxygen Therapy SpO2: 92 % Pain: Pain Assessment Pain Scale: 0-10 Pain Score: 8  Pain Type: Neuropathic pain Pain Location: Leg Pain Orientation: Left Pain Descriptors / Indicators: Aching Pain Frequency: Constant Pain Onset: On-going Pain Intervention(s): Repositioned Multiple Pain Sites: Yes ADL: ADL Eating: Set up Where  Assessed-Eating: Bed level Grooming: Minimal assistance Where Assessed-Grooming: Chair Upper Body Bathing: Dependent Where Assessed-Upper Body Bathing: Bed level Lower Body Bathing: Dependent Where Assessed-Lower Body Bathing: Bed level Upper Body Dressing: Maximal assistance Where Assessed-Upper Body Dressing: Bed level Lower Body Dressing: Dependent Where Assessed-Lower Body Dressing: Bed level Toilet Transfer: Unable to assess Toilet Transfer Method: Unable to assess Tub/Shower Transfer: Unable to assess Tub/Shower Transfer Method: Unable to assess   Therapy/Group: Individual Therapy  Gypsy Decant 11/13/2019, 9:38 AM

## 2019-11-13 NOTE — Progress Notes (Signed)
Inpatient Rehabilitation Care Coordinator Assessment and Plan  Patient Details  Name: [name] MRN: [id number] Date of Birth: 1940/08/25  Today's Date: 11/13/2019  Problem List:  Patient Active Problem List   Diagnosis Date Noted  . Palliative care by specialist   . DNR (do not resuscitate)   . Acute on chronic respiratory failure with hypoxia ([place]) 10/29/2019  . Incomplete paraplegia ([place]) 10/28/2019  . Labile blood pressure   . Spasticity   . Muscle spasm   . Hypoxia   . Supplemental oxygen dependent   . Neurogenic bladder   . Chronic diastolic congestive heart failure ([place])   . Neurogenic bowel   . Hypocalcemia   . Spinal stenosis of lumbar region   . Cervical myelopathy ([place]) 09/26/2019  . Diabetes mellitus type 2, controlled, with complications ([place]) 92/92/4462  . Obese 09/17/2019  . Thoracic aortic aneurysm ([place]) 09/17/2019  . Bowel incontinence 09/17/2019  . Bladder incontinence 09/17/2019  . Right hemiparesis ([place]) 09/17/2019  . Acute renal failure (ARF) ([place]) 09/17/2019  . Left hemiparesis ([place]) 09/17/2019  . Chronic respiratory failure with hypoxia ([place]) 09/17/2019  . Syncope and collapse 09/17/2019  . Chronic diastolic CHF (congestive heart failure) ([place]) 09/04/2019  . PVD (peripheral vascular disease) ([place]) 02/14/2019  . Essential hypertension 02/13/2019  . Non-insulin treated type 2 diabetes mellitus ([place]) 02/13/2019  . Severe sepsis ([place]) 01/17/2019  . Community acquired pneumonia of right lower lobe of lung 01/17/2019  . Postural urinary incontinence 05/20/2018  . Myelopathy concurrent with and due to spinal stenosis of cervical region ([place]) 05/20/2018  . Myoclonic jerking 05/20/2018  . 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 History of respiratory failure 08/18/2017  . Hypokalemia 08/18/2017  . Leukocytosis 08/18/2017  . Muscle atrophy of lower extremity 08/09/2017  . Bowel and bladder incontinence 08/09/2017   . Right-sided muscle weakness 08/09/2017  . Neuropathy 08/09/2017  . 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/2017  . Abnormality of gait 07/12/2017  . Myoclonic jerkings, 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/2017  . Acquired right foot drop 07/12/2017  . UTI (urinary tract infection) due to Enterococcus 07/12/2017  . Pressure ulcer 06/21/2017  . Acute cystitis 06/20/2017  . Weakness generalized 06/20/2017  . Dehydration 06/20/2017  . Dyspnea 12/26/2016  . Chronic cough 12/26/2016  . Hypersomnia with sleep apnea 10/30/2016  . CSA (central sleep apnea) 10/30/2016  . Wheezing symptom 10/30/2016  . 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 RLS (restless legs syndrome) 08/25/2014  . Primary gout 08/25/2014  . OSA on CPAP 08/25/2014  . Severe obesity (BMI >= 40) ([place]) 08/25/2014  . Obesity (BMI 30-39.9) 02/18/2013  . 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 S/P TKR (total knee replacement) 11/29/2012  . History of thyroid cancer    Past Medical History:  Past Medical History:  Diagnosis Date  . Arthritis   . Barrett esophagus   . Bronchitis   . Carotid artery occlusion   . COPD (chronic obstructive pulmonary disease) ([place])   . Diabetes mellitus without complication ([place])   . GERD (gastroesophageal reflux disease)   . Headache    migraines  . History of thyroid cancer 2008  . Hypertension   . Restless leg   . Sleep apnea with use of continuous positive airway pressure (CPAP)    2011 [facility] , AHI  77cn central and obstrcutive. 16 cm water , 3 cm EPR.   . Thoracic ascending aortic aneurysm ([place])  4.2 cm ascending TAA 08/18/17 CTA. Annual imaging recommended.  . thyroid ca dx'd 2009 or 2010   surg and radioactive isoptope  . Ulcer    Peptic ulcer disease   Past Surgical History:  Past Surgical History:  Procedure Laterality Date  . ANTERIOR CERVICAL DECOMP/DISCECTOMY FUSION N/A 09/03/2017   Procedure: ACDF - C3-C4  - C4-C5 - C5-C6;  Surgeon: Kary Kos, MD;  Location: [facility];  Service: Neurosurgery;  Laterality: N/A;  . CAROTID ENDARTERECTOMY Left January 03, 2006   Dr. Amedeo Plenty  . COLONOSCOPY    . EYE SURGERY Bilateral    cataract surgery with lens implants  . game keepers thumb Right   . JOINT REPLACEMENT Left    knee X 2  . LUMBAR LAMINECTOMY/ DECOMPRESSION WITH MET-RX N/A 11/04/2019   Procedure: LUMBAR THREE-FOUR LUMBAR LAMINECTOMY/ DECOMPRESSION WITH MET-RX;  Surgeon: Judith Part, MD;  Location: [facility];  Service: Neurosurgery;  Laterality: N/A;  . RADIOLOGY WITH ANESTHESIA N/A 10/16/2019   Procedure: MRI THORACIC LUMBAR SPINE;  Surgeon: Radiologist, Medication, MD;  Location: [facility];  Service: Radiology;  Laterality: N/A;  . ROTATOR CUFF REPAIR Right   . THYROIDECTOMY  2008   Social History:  reports that he quit smoking about 14 years ago. His smoking use included cigars and cigarettes. He quit after 40.00 years of use. He has never used smokeless tobacco. He reports that he does not drink alcohol or use drugs.  Family / Support Systems Marital Status: Married How Long?: 11 years Patient Roles: Spouse, Parent Spouse/Significant Other: [name] (wife): [phone] Children: 2 adult children (son lives in [place]; dtr lives in [place]) Other Supports: N/A Anticipated Caregiver: Wife and caregivers 3hs in AM/PM (7am-10am/7pm-10pm) Ability/Limitations of Caregiver: Not reported Caregiver Availability: 24/7 Family Dynamics: Pt lives with his wife, and has assistance from caregivers  Social History Preferred language: English Religion: Presbyterian Cultural Background: Pt worked at [employer] for 32 years and retired in 1999; and than worked at [employer] for 7 years and retired in 2007 Education: 1 yr of college; 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 SW discussed meeting with neuropsych and pt declined Psychiatric History: Denies Substance Abuse History: DEnies; quit smoking cigarettes in 1970/cigars [phone]  Patient / Family Perceptions, Expectations & Goals Pt/Family understanding of illness & functional limitations: Pt and pt wife have general understanding of medical condition Premorbid pt/family roles/activities: Independent Anticipated changes in roles/activities/participation: Assistance with ADLs/IADLs  Community Resources Express Scripts: None Premorbid Home Care/DME Agencies: None Transportation available at discharge: Wife  Discharge Planning Living Arrangements: Spouse/significant other, Non-relatives/Friends Support Systems: Spouse/significant other, Other (Comment)(private duty aides) Type of Residence: Private residence Insurance underwriter Resources: Multimedia programmer (specify)([employer] Medicare) Financial Screen Referred: No Living Expenses: Own Money Management: Spouse, Patient Does the patient have any problems obtaining your medications?: No Care Coordinator Barriers to Discharge: Decreased caregiver support, Lack of/limited family support Care Coordinator Anticipated Follow Up Needs: HH/OP Expected length of stay: 7-10 days  Clinical Impression SW familiar with pt. SW met with pt in room to introduce self, explain role, and discuss discharge process. Pt was in  [employer] [phone]. No HCPOA. DME: 2 canes, 2 RW, rollator (given to him), w/c (purchased within last 2 yrs/[facility]), transport w/c, BSC, 2 slide boards,  shower chair, and a ramped entrance.    Rana Snare 11/13/2019, 3:33 PM

## 2019-11-13 NOTE — Progress Notes (Signed)
[facility] Individual Statement of Services  Patient Name:  [name]  Date:  11/13/2019  Welcome to the [facility].  Our goal is to provide you with an individualized program based on your diagnosis and situation, designed to meet your specific needs.  With this comprehensive rehabilitation program, you will be expected to participate in at least 3 hours of rehabilitation therapies Monday-Friday, with modified therapy programming on the weekends.  Your rehabilitation program will include the following services:  Physical Therapy (PT), Occupational Therapy (OT), 24 hour per day rehabilitation nursing, Therapeutic Recreaction (TR), Psychology, Neuropsychology, Care Coordinator, Rehabilitation Medicine, Nutrition Services, Pharmacy Services and Other  Weekly team conferences will be held on Tuesdays to discuss your progress.  Your Inpatient Rehabilitation Care Coordinator will talk with you frequently to get your input and to update you on team discussions.  Team conferences with you and your family in attendance may also be held.  Expected length of stay: 7-10 days   Overall anticipated outcome: Minimal Assistance  Depending on your progress and recovery, your program may change. Your Inpatient Rehabilitation Care Coordinator will coordinate services and will keep you informed of any changes. Your Inpatient Rehabilitation Care Coordinator's name and contact numbers are listed  below.  The following services may also be recommended but are not provided by the [facility] will be made to provide these services after discharge if needed.  Arrangements include referral to agencies that provide these services.  Your insurance has been verified to be:  [facility] Medicare  Your primary doctor is:   Alfredo Bach  Pertinent information will be shared with your doctor and your insurance company.  Inpatient Rehabilitation Care Coordinator:  Cathleen Corti [phone] or [phone]  Information discussed with and copy given to patient by: Rana Snare, 11/13/2019, 3:25 PM

## 2019-11-13 NOTE — Progress Notes (Signed)
Patient listed as full code in system but states " I do not want chest compression". Pt is alert and oriented x 4. On call nurse practitioner Danella Sensing and charge nurse made aware via phone. House supervisor being paged per nurse practitioner to get code status updated to patient's wishes.

## 2019-11-13 NOTE — Progress Notes (Signed)
Cesar Dublin RN called this provider at 22:30, she reports [name] Code Status is listed as a Full Code. She spoke with [name] he stated: He did not want to receive chest compression. This provider spoke with charge nurse Lysle Morales RN regarding the above. Lysle Morales placed a call to the [employer] and spoke with Shanon Brow. She stated Shanon Brow stated it was up to the PMR physician to make the change to the DNR Status.  This provider placed a call to [facility] on call doctor, he stated I Danella Sensing ANP-C can speak with [name] regarding his wishes and Dr. Dagoberto Ligas can change the DNR status in the Morning.  I Bayard Hugger ANP-C called Ms. Cesar Dublin RN on her personal cell phone and spoke with [name] via speaker, he was asked  his full name and DOB and he was able to answer the above questions correctly. He was asked in case of an emergency how  would he like the medical staff to proceed in his care. He states " He wants to be a DNR", also stated he was already a DNR and doesn't understand how this was change to a Full Code Status. Lauren RN and this provider apologize to [name]. Emotional Support given. Lauren RN was present when the above call was placed and when the questions were asked to [name].

## 2019-11-13 NOTE — Progress Notes (Signed)
Physical Therapy Session Note  Patient Details  Name: [name] MRN: [id number] Date of Birth: 10/31/1940  Today's Date: 11/13/2019 PT Individual Time: 1315-1415 PT Individual Time Calculation (min): 60 min   Short Term Goals: Week 1:  PT Short Term Goal 1 (Week 1): =LTG due to ELOS  Skilled Therapeutic Interventions/Progress Updates:    Pt received seated in bed, wife present for hands-on family education with his wife [name]. Pt reports ongoing pain in LLE, not rated and declines intervention other than repositioning. Pt eager to get up to power wheelchair this session. Attempted to obtain maxi move to perform transfer to power w/c this session. Unable to obtain maxi move so obtained +2 assist as pt's wife not able to provide physical assist needed for slide board transfers. Pt is dependent to don pants at bed level with mod A for rolling. Supine to sit with assist x 2 for LE management and trunk control. Beezy board transfer bed to power w/c with max A x 2. Once pt seated in power w/c reviewed controls with regards to tilt/relcline for pressure relief and short driving tutorial with patient. Will continue to review power w/c mobility with patient. Pt left seated in power w/c in room with needs in reach at end of session.  Therapy Documentation Precautions:  Precautions Precautions: Fall, Back Precaution Booklet Issued: Yes (comment) Precaution Comments: L lateral ankle ulcer, L buttock/sacral ulcer Restrictions Weight Bearing Restrictions: No    Therapy/Group: Individual Therapy   Excell Seltzer, PT, DPT  11/13/2019, 4:09 PM

## 2019-11-13 NOTE — Progress Notes (Signed)
[facility] PHYSICAL MEDICINE & REHABILITATION PROGRESS NOTE   Subjective/Complaints:    L foot doing better- no complaints today.  Main issues- wants earwax out of ears and wants to stop Zanaflex- thinks that medicine is causing drugged feeling.   Says he feels the best he has since admission.   ROS:  Pt denies SOB, abd pain, CP, N/V/C/D, and vision changes   Objective:   No results found. Recent Labs    11/10/19 2001 11/11/19 0737  WBC 13.3* 10.9*  HGB 11.4* 12.2*  HCT 35.6* 38.2*  PLT 269 272   Recent Labs    11/10/19 2001 11/11/19 0737  NA  --  140  K  --  4.0  CL  --  98  CO2  --  31  GLUCOSE  --  85  BUN  --  20  CREATININE 0.94 0.75  CALCIUM  --  9.4    Intake/Output Summary (Last 24 hours) at 11/13/2019 1033 Last data filed at 11/13/2019 0534 Gross per 24 hour  Intake 240 ml  Output 1400 ml  Net -1160 ml     Physical Exam: Vital Signs Blood pressure 106/68, pulse 73, temperature 97.7 F (36.5 C), temperature source Oral, resp. rate 18, height 5\' 7"  (1.702 m), weight 99.7 kg, SpO2 92 %.   Physical Exam General: sleepy but woke up- on L side taking pressure off buttocks; appropriate, NAD Heart: RRR Lungs: post nebs- the best I've heard his lungs- very little coarse- 1 wheeze with expiration- great air movement Abdomen: Soft, NT, ND, (+)BS   Extremities: No clubbing, cyanosis, or edema Skin: Sacral decubitus irregular  ~10cm at widest point stages 1, 3 and unstageable portion under eschar  Also assessed L ankle pressure- more superficial -less red and covered slough- 50% came off with dressing- pink underneath    Neurologic: Cranial nerves II through XII intact, motor strength is 4/5 in bilateral deltoid, bicep, tricep, grip,  Difficult to perform MMT in LEs had increase knee flexor tone limiting ROM Severe LE spasms- legs drawn up somewhat Sensory exam normal sensation to light touch lower extremities Musculoskeletal: Bilateral hip flexor  and knee flexor contracture vs MAS 4 spasticity- hamstrings very tight    Assessment/Plan: 1. 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  Physiatrist is providing close team supervision and 24 hour management of active medical problems listed below.  Physiatrist and rehab team continue to assess barriers to discharge/monitor patient progress toward functional and medical goals  Care Tool:  Bathing    Body parts bathed by patient: Chest, Abdomen, Face   Body parts bathed by helper: Right arm, Left arm, Front perineal area, Buttocks, Right upper leg, Left upper leg, Right lower leg, Left lower leg     Bathing assist Assist Level: Total Assistance - Patient < 25%     Upper Body Dressing/Undressing Upper body dressing   What is the patient wearing?: Pull over shirt    Upper body assist Assist Level: Total Assistance - Patient < 25%    Lower Body Dressing/Undressing Lower body dressing      What is the patient wearing?: Pants     Lower body assist Assist for lower body dressing: Dependent - Patient 0%     Toileting Toileting    Toileting assist Assist for toileting: Dependent - Patient 0%  Transfers Chair/bed transfer  Transfers assist     Chair/bed transfer assist level: 2 Helpers     Locomotion Ambulation   Ambulation assist   Ambulation activity did not occur: Safety/medical concerns          Walk 10 feet activity   Assist  Walk 10 feet activity did not occur: Safety/medical concerns        Walk 50 feet activity   Assist Walk 50 feet with 2 turns activity did not occur: Safety/medical concerns         Walk 150 feet activity   Assist Walk 150 feet activity did not occur: Safety/medical concerns         Walk 10 feet  on uneven surface  activity   Assist Walk 10 feet on uneven surfaces activity did not occur: Safety/medical concerns         Wheelchair     Assist Will patient use wheelchair at discharge?: Yes Type of Wheelchair: (TBD) Wheelchair activity did not occur: Safety/medical concerns         Wheelchair 50 feet with 2 turns activity    Assist    Wheelchair 50 feet with 2 turns activity did not occur: Safety/medical concerns       Wheelchair 150 feet activity     Assist  Wheelchair 150 feet activity did not occur: Safety/medical concerns       Blood pressure 106/68, pulse 73, temperature 97.7 F (36.5 C), temperature source Oral, resp. rate 18, height 5\' 7"  (1.702 m), weight 99.7 kg, SpO2 92 %.  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 Status post L3-4 lumbar laminectomy decompression -patient may  shower -ELOS/Goals: one person assist ADL and mobility goals, heal skin to stage 2 or better if possible- hard to keep pt on side due to spasms  5/25- asked to get w/c eval; done asap since 1 of reaosns pt back to rehab/CIR 2. Antithrombotics: -DVT/anticoagulation:Subcutaneous heparin. Recent venous Doppler studies negative.  5/25- changed to lovenox since renal fxn ok -antiplatelet therapy: Aspirin 81 mg daily 3. Pain Management:Baclofen 30 mg 3 times daily and 40 mg nightly, Zanaflex 2 mg 3 times daily, Neurontin 100 mg nightly, hydrocodone and Robaxin as needed. Imitrex as needed for headaches Would consider Botox 150-200 Units each hamstring  5/26- will try to get it done- either here or outpt.   5/27- will d/c zanaflex per pt request- says he knows it was for spasticity 4. Mood:Trazodone as needed -antipsychotic agents: N/A 5. Neuropsych: This patientiscapable of making decisions on  hisown behalf. 6. Skin/Wound Care:Apply Santyl ointment to left buttock sacral wound and left lateral ankle wound in a nickel thick layer. Cover with saline moistened gauze then sized appropriate foam dressing changed daily, as discussed with PT/Wound care may benefit from hydrotherapy after 1-2 wks of colllagenase   5/25- also need to assess L ankle 7. Fluids/Electrolytes/Nutrition:Routine in and outs with follow-up chemistries 8. Chronic restless leg syndrome. 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  10. OSA/chronic respiratory failure. Continue CPAP and oxygen as directed with nebulizer treatments  5/25- add duonebs at 8am in morning 11. Neurogenic bowel bladder. Flomax 0.8 mg daily. Check PVR. 12. Hypothyroidism. Synthroid 13. 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 Follow-up outpatient 14. Chronic diastolic congestive heart  failure. Monitor for any signs of fluid overload. Lasix 40 mg daily. Continue Aldactone 25 mg daily 15. Hypertension. Toprol-XL 50 mg daily.  5/26- BP well controlled- con't meds/regimen 16. Hyperlipidemia. Lipitor 17. Gout. Zyloprim 300 mg daily.  5/25- change to colchicine since acute gout Sx' and add Indomethacin 50 mg BID with meals for pain x 10 doses  5/27- gout Sx's better- con't regimen 18. Earwax  5/27- ordered debrox B/L ears- x 5 days     LOS: 3 days A FACE TO FACE EVALUATION WAS PERFORMED  [name] 11/13/2019, 10:33 AM

## 2019-11-14 ENCOUNTER — Inpatient Hospital Stay (HOSPITAL_COMMUNITY): Payer: Medicare Other

## 2019-11-14 ENCOUNTER — Inpatient Hospital Stay (HOSPITAL_COMMUNITY): Payer: Medicare Other | Admitting: Physical Therapy

## 2019-11-14 MED ORDER — OXYMETAZOLINE HCL 0.05 % NA SOLN
1.0000 | Freq: Two times a day (BID) | NASAL | Status: DC | PRN
  Administered 2019-11-15 – 2019-11-24 (×5): 1 via NASAL
  Filled 2019-11-14: qty 30

## 2019-11-14 NOTE — Progress Notes (Signed)
Patient ID: [name], male   DOB: March 30, 1941, 79 y.o.   MRN: [id number]  Pt and pt wife request an extension for more family education.   *Medical team approved for extension to 11/21/2019.   Loralee Pacas, MSW, [place] Office: [phone] Cell: [phone] Fax: [phone]

## 2019-11-14 NOTE — Plan of Care (Signed)
  Problem: RH SKIN INTEGRITY Goal: RH STG SKIN FREE OF INFECTION/BREAKDOWN Outcome: Progressing Goal: RH STG MAINTAIN SKIN INTEGRITY WITH ASSISTANCE Description: STG Maintain Skin Integrity With Assistance. Outcome: Progressing Goal: RH STG ABLE TO PERFORM INCISION/WOUND CARE W/ASSISTANCE Description: STG Able To Perform Incision/Wound Care With Assistance. Outcome: Progressing   Problem: RH PAIN MANAGEMENT Goal: RH STG PAIN MANAGED AT OR BELOW PT'S PAIN GOAL Outcome: Progressing

## 2019-11-14 NOTE — Progress Notes (Signed)
Occupational Therapy Session Note  Patient Details  Name: MELQUIADES AGRICOLA MRN: [id number] Date of Birth: 05/12/1941  Today's Date: 11/14/2019 OT Individual Time: 0900-1000 OT Individual Time Calculation (min): 60 min    Short Term Goals: Week 1:  OT Short Term Goal 1 (Week 1): STG = LTG 2/2 ELOS  Skilled Therapeutic Interventions/Progress Updates:    Pt resting in bed upon arrival eating breakfast.  Pt with R lateral lean in bed and required max A to reposition. Pt agreeable to getting OOB into power w/c. Pt required max A for rolling R/L in bed for positioning of sling for transfer with maxi move. Pt required tot A for positioning in w/c after transfer. OT intervention with focus on activity tolerance, endurance, bed mobility, and upright tolerance in w/c. Pt remained in w/c with all needs within reach. Wife present.   Therapy Documentation Precautions:  Precautions Precautions: Fall, Back Precaution Booklet Issued: Yes (comment) Precaution Comments: L lateral ankle ulcer, L buttock/sacral ulcer Restrictions Weight Bearing Restrictions: No Pain:  Pt c/o 5/10 pain in buttocks   Therapy/Group: Individual Therapy  [name] 11/14/2019, 12:16 PM

## 2019-11-14 NOTE — Progress Notes (Signed)
Physical Therapy Session Note  Patient Details  Name: [name] MRN: [id number] Date of Birth: 1940/12/21  Today's Date: 11/14/2019 PT Individual Time: 1300-1415 PT Individual Time Calculation (min): 75 min   Short Term Goals: Week 1:  PT Short Term Goal 1 (Week 1): =LTG due to ELOS  Skilled Therapeutic Interventions/Progress Updates:    Pt received seated in power w/c in room, agreeable to PT session. Pt reports pain in LLE in ankle, not rated and declines intervention. Session focus on power w/c mobility in functional environment navigating hospital environment and outdoors. Pt is at Supervision for power w/c mobility up to 500 ft, max cueing for navigating on/off elevators. Pt's wife [name] present during therapy session and able to observe pt learning controls for power wheelchair mobility as well as assist with transfers. Pt requests to return to bed at end of session. Assist x 2 for placement of maxi sky sling while seated in chair via lateral and anterior leans. Maxi move transfer back to bed. Rolling L/R with mod A for removal of sling. Pt left in R sidelying in bed with needs in reach. Pt on 2L O2 throughout session, SpO2 remains at 93% and above.  Therapy Documentation Precautions:  Precautions Precautions: Fall, Back Precaution Booklet Issued: Yes (comment) Precaution Comments: L lateral ankle ulcer, L buttock/sacral ulcer Restrictions Weight Bearing Restrictions: No    Therapy/Group: Individual Therapy   Excell Seltzer, PT, DPT  11/14/2019, 3:24 PM

## 2019-11-14 NOTE — Progress Notes (Signed)
[employer] PHYSICAL MEDICINE & REHABILITATION PROGRESS NOTE   Subjective/Complaints:    Feeling better/awake-wise this AM- off Zanaflex- thinks spasms about the same to slightly BETTER.   Having a lot of pain in sacrum/wound- from sitting up on it- nurse to work to get him off of it and allow him to eat.   Nose stopped up- used Afrin at home 5+ days/week.  Is DNR- wants full code to be changed to DNR.   ROS:  Pt denies SOB, abd pain, CP, N/V/C/D, and vision changes    Objective:   No results found. No results for input(s): WBC, HGB, HCT, PLT in the last 72 hours. No results for input(s): NA, K, CL, CO2, GLUCOSE, BUN, CREATININE, CALCIUM in the last 72 hours.  Intake/Output Summary (Last 24 hours) at 11/14/2019 1339 Last data filed at 11/14/2019 0655 Gross per 24 hour  Intake 240 ml  Output 2000 ml  Net -1760 ml     Physical Exam: Vital Signs Blood pressure 112/66, pulse 61, temperature (!) 97.5 F (36.4 C), temperature source Oral, resp. rate 16, height 5\' 7"  (1.702 m), weight 95.7 kg, SpO2 97 %.   Physical Exam General: awake, sitting up slightly- wearing O2 by [place]; appropriate, raspy voice- just woke up, NAD Heart: RRR Lungs: pre-am nebs- more coarse, very coarse throughout- adequate air movement Abdomen: Soft, NT, ND, (+)BS  Extremities: No clubbing, cyanosis, or edema Skin: Sacral decubitus irregular  ~10cm at widest point stages 1, 3 and unstageable portion under eschar  Also assessed L ankle pressure- more superficial -less red and covered slough- 50% came off with dressing- pink underneath    Neurologic: Cranial nerves II through XII intact, motor strength is 4/5 in bilateral deltoid, bicep, tricep, grip,  Difficult to perform MMT in LEs had increase knee flexor tone limiting ROM Actually spasms SLIGHTLY less frequent this AM- ~ q45 seconds vs q20 seconds-  Sensory exam normal sensation to light touch lower extremities Musculoskeletal: Bilateral hip  flexor and knee flexor contracture vs MAS 4 spasticity- hamstrings very tight    Assessment/Plan: 1. 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  Physiatrist is providing close team supervision and 24 hour management of active medical problems listed below.  Physiatrist and rehab team continue to assess barriers to discharge/monitor patient progress toward functional and medical goals  Care Tool:  Bathing    Body parts bathed by patient: Chest, Abdomen, Face   Body parts bathed by helper: Right arm, Left arm, Front perineal area, Buttocks, Right upper leg, Left upper leg, Right lower leg, Left lower leg     Bathing assist Assist Level: Total Assistance - Patient < 25%     Upper Body Dressing/Undressing Upper body dressing   What is the patient wearing?: Pull over shirt    Upper body assist Assist Level: Total Assistance - Patient < 25%    Lower Body Dressing/Undressing Lower body dressing      What is the patient wearing?: Incontinence brief     Lower body assist Assist for lower body dressing: Dependent - Patient 0%     Toileting Toileting    Toileting assist Assist for toileting: Dependent - Patient 0%     Transfers Chair/bed transfer  Transfers assist     Chair/bed transfer assist level: 2 Helpers     Locomotion Ambulation  Ambulation assist   Ambulation activity did not occur: Safety/medical concerns          Walk 10 feet activity   Assist  Walk 10 feet activity did not occur: Safety/medical concerns        Walk 50 feet activity   Assist Walk 50 feet with 2 turns activity did not occur: Safety/medical concerns         Walk 150 feet activity   Assist Walk 150 feet activity did not occur: Safety/medical concerns          Walk 10 feet on uneven surface  activity   Assist Walk 10 feet on uneven surfaces activity did not occur: Safety/medical concerns         Wheelchair     Assist Will patient use wheelchair at discharge?: Yes Type of Wheelchair: (TBD) Wheelchair activity did not occur: Safety/medical concerns         Wheelchair 50 feet with 2 turns activity    Assist    Wheelchair 50 feet with 2 turns activity did not occur: Safety/medical concerns       Wheelchair 150 feet activity     Assist  Wheelchair 150 feet activity did not occur: Safety/medical concerns       Blood pressure 112/66, pulse 61, temperature (!) 97.5 F (36.4 C), temperature source Oral, resp. rate 16, height 5\' 7"  (1.702 m), weight 95.7 kg, SpO2 97 %.  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 Status post L3-4 lumbar laminectomy decompression -patient may  shower -ELOS/Goals: one person assist ADL and mobility goals, heal skin to stage 2 or better if possible- hard to keep pt on side due to spasms  5/25- asked to get w/c eval; done asap since 1 of reaosns pt back to rehab/CIR  5/28- W/C eval wil be Monday  -pt wants to be DNR- changed order 2. Antithrombotics: -DVT/anticoagulation:Subcutaneous heparin. Recent venous Doppler studies negative.  5/25- changed to lovenox since renal fxn ok -antiplatelet therapy: Aspirin 81 mg daily 3. Pain Management:Baclofen 30 mg 3 times daily and 40 mg nightly, Zanaflex 2 mg 3 times daily, Neurontin 100 mg nightly, hydrocodone and Robaxin as needed. Imitrex as needed for headaches Would consider Botox 150-200 Units each hamstring  5/26- will try to get it done- either here or outpt.   5/27- will d/c zanaflex per pt request- says he knows it was for spasticity  5/28- spasticity slightly better off  Zanaflex 4. Mood:Trazodone as needed -antipsychotic agents: N/A 5. Neuropsych: This patientiscapable of making decisions on hisown behalf. 6. Skin/Wound Care:Apply Santyl ointment to left buttock sacral wound and left lateral ankle wound in a nickel thick layer. Cover with saline moistened gauze then sized appropriate foam dressing changed daily, as discussed with PT/Wound care may benefit from hydrotherapy after 1-2 wks of colllagenase   5/28- L ankle looks slightly bette-r less slough and more superficial 7. Fluids/Electrolytes/Nutrition:Routine in and outs with follow-up chemistries 8. Chronic restless leg syndrome. 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   5/28- better affect/mood- per pt 10. OSA/chronic respiratory failure. Continue CPAP and oxygen as directed with nebulizer treatments  5/25- add duonebs at 8am in morning 11. Neurogenic bowel bladder. Flomax 0.8 mg daily. Check PVR. 12. Hypothyroidism. Synthroid 13. 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 Follow-up outpatient 14. Chronic diastolic congestive heart failure. Monitor for any signs of fluid overload. Lasix 40 mg daily. Continue Aldactone 25 mg daily 15. Hypertension. Toprol-XL 50 mg daily.  5/26- BP well controlled- con't meds/regimen 16. Hyperlipidemia. Lipitor 17. Gout. Zyloprim 300 mg daily.  5/25- change to colchicine since acute gout Sx' and add Indomethacin 50 mg BID with meals for pain x 10 doses  5/27- gout Sx's better- con't regimen 18. Earwax  5/27- ordered debrox B/L ears- x 5 days  5/28= pt reports no better so far- had 1 dose 19. Nasal stuffiness  5/28- ordered Afrin BID PRN- since used TOO much at home, and didn't respond to flonase at all-     LOS: 4 days A FACE TO FACE EVALUATION WAS  PERFORMED  [name] 11/14/2019, 1:39 PM

## 2019-11-15 ENCOUNTER — Ambulatory Visit (HOSPITAL_COMMUNITY): Payer: Medicare Other

## 2019-11-15 ENCOUNTER — Encounter (HOSPITAL_COMMUNITY): Payer: Medicare Other | Admitting: Occupational Therapy

## 2019-11-15 MED ORDER — SALINE SPRAY 0.65 % NA SOLN
1.0000 | NASAL | Status: DC | PRN
  Administered 2019-11-15: 1 via NASAL
  Filled 2019-11-15: qty 44

## 2019-11-15 MED ORDER — METOPROLOL SUCCINATE ER 25 MG PO TB24
25.0000 mg | ORAL_TABLET | Freq: Every day | ORAL | Status: DC
  Administered 2019-11-15 – 2019-11-25 (×11): 25 mg via ORAL
  Filled 2019-11-15 (×11): qty 1

## 2019-11-15 NOTE — Progress Notes (Signed)
Patient c/o not being able to breath on his CPAP. Offered patient his nasal canula instead but he said it's just worse. Oxygen saturation was 93-97%. Patient stated he felt congested. Nasal decongestant given prn. He finally agreed to the nasal canula with at 3L/min.  Notified respiratory. Patient feels a little better.

## 2019-11-15 NOTE — Progress Notes (Signed)
Called by nursing for breathing problems.  Ordered chest x-ray however when talking to patient it really sounds more like a stuffy nose.  Will cancel chest x-ray use humidified oxygen as well as [place] nasal spray

## 2019-11-15 NOTE — Progress Notes (Signed)
Physical Therapy Session Note  Patient Details  Name: COADY RYBICKI MRN: [id number] Date of Birth: [birth year]  Today's Date: 11/15/2019 PT Individual Time: [id number] PT Individual Time Calculation (min): 57 min   Short Term Goals: Week 1:  PT Short Term Goal 1 (Week 1): =LTG due to ELOS  Skilled Therapeutic Interventions/Progress Updates:   Received pt in R sidelying in bed, pt agreeable to therapy after convincing, and reported severe 9/10 pain in buttocks stating "the people here just don't care". RN made aware but pt unable to receive next dose of pain meds for a while. Pt on 4L O2 via [place] throughout session with O2 sat >94% throughout session. Session with emphasis on self-directed care, eating, bed mobility, rolling, pressure relief education, and improved activity tolerance. Pt declined getting OOB into power WC stating "If you try to get me up right now I'm going to scream in pain". Therapist suggested sitting EOB to eat lunch however pt declined stating "If I do that it's going to make the sore on my bottom worse". Therapist educated pt on importance of OOB mobility for general strengthening, respiratory, cardiovascular, digestion, and pressure relief benefits but pt reported his pain levels were too high. Pt agreeable to eat lunch supine in bed. Pt rolled from R sidelying<>midline with mod A. Pt scooted to [facility] with supervision, use of Trendelenburg bed position, and use of bilateral UEs to achieve proper alignment to eat. Pt finally reporting feeling comfortable after readjusting several times in bed. Pt ate chicken and dumplings while supine in bed with HOB elevated with min A to cut and prepare food. Pt reported increased muscle spasms in LEs and therapist performed manual massage to L quadriceps x10 minutes. Noted pt with reduced muscle spasms and pt stated his leg "felt better temporarily". While pt ate lunch, discussed D/C plan, equipment, pt's confidence with directing functional mobility  tasks, and numerous strategies for pressure relief. Concluded session with pt supine in bed, needs within reach, and bed alarm on. Increased time required for therapist to reposition pillows under bilateral heels, L elbow, and underneath knees for pressure relief and comfort. Therapist communicated with RN to roll pt again in 15 minutes after he finished lunch.   Therapy Documentation Precautions:  Precautions Precautions: Fall, Back Precaution Booklet Issued: Yes (comment) Precaution Comments: L lateral ankle ulcer, L buttock/sacral ulcer Restrictions Weight Bearing Restrictions: No  Therapy/Group: Individual Therapy Shjon Elbert PT, DPT   11/15/2019, 7:39 AM

## 2019-11-15 NOTE — Plan of Care (Signed)
  Problem: Consults Goal: RH GENERAL PATIENT EDUCATION Description: See Patient Education module for education specifics. Outcome: Progressing Goal: Skin Care Protocol Initiated - if Braden Score 18 or less Description: If consults are not indicated, leave blank or document N/A Outcome: Progressing Goal: Nutrition Consult-if indicated Outcome: Progressing Goal: Diabetes Guidelines if Diabetic/Glucose > 140 Description: If diabetic or lab glucose is > 140 mg/dl - Initiate Diabetes/Hyperglycemia Guidelines & Document Interventions  Outcome: Progressing   Problem: RH BLADDER ELIMINATION Goal: RH STG MANAGE BLADDER WITH ASSISTANCE Description: STG Manage Bladder With Assistance Outcome: Progressing   Problem: RH SKIN INTEGRITY Goal: RH STG SKIN FREE OF INFECTION/BREAKDOWN Outcome: Progressing Goal: RH STG MAINTAIN SKIN INTEGRITY WITH ASSISTANCE Description: STG Maintain Skin Integrity With Assistance. Outcome: Progressing Goal: RH STG ABLE TO PERFORM INCISION/WOUND CARE W/ASSISTANCE Description: STG Able To Perform Incision/Wound Care With Assistance. Outcome: Progressing   Problem: RH SAFETY Goal: RH STG ADHERE TO SAFETY PRECAUTIONS W/ASSISTANCE/DEVICE Description: STG Adhere to Safety Precautions With Assistance/Device. Outcome: Progressing Goal: RH STG DECREASED RISK OF FALL WITH ASSISTANCE Description: STG Decreased Risk of Fall With Assistance. Outcome: Progressing   Problem: RH PAIN MANAGEMENT Goal: RH STG PAIN MANAGED AT OR BELOW PT'S PAIN GOAL Outcome: Progressing   Problem: Consults Goal: RH SPINAL CORD INJURY PATIENT EDUCATION Description:  See Patient Education module for education specifics.  Outcome: Progressing

## 2019-11-15 NOTE — Progress Notes (Addendum)
[place] PHYSICAL MEDICINE & REHABILITATION PROGRESS NOTE   Subjective/Complaints:  Slept ok , no issues overnite   ROS:  Pt denies SOB, abd pain, CP, N/V/C/D, and vision changes    Objective:   No results found. No results for input(s): WBC, HGB, HCT, PLT in the last 72 hours. No results for input(s): NA, K, CL, CO2, GLUCOSE, BUN, CREATININE, CALCIUM in the last 72 hours.  Intake/Output Summary (Last 24 hours) at 11/15/2019 1021 Last data filed at 11/15/2019 0325 Gross per 24 hour  Intake --  Output 1950 ml  Net -1950 ml     Physical Exam: Vital Signs Blood pressure (!) 92/50, pulse 60, temperature 98.4 F (36.9 C), resp. rate 18, height 5\' 7"  (1.702 m), weight 95.7 kg, SpO2 94 %.   Physical Exam General: awake, sitting up slightly- wearing O2 by [place]; appropriate, raspy voice- just woke up, NAD Heart: RRR Lungs: pre-am nebs- more coarse, very coarse throughout- adequate air movement Abdomen: Soft, NT, ND, (+)BS  Extremities: No clubbing, cyanosis, or edema Skin: Sacral decubitus irregular  ~10cm at widest point stages 1, 3 and unstageable portion under eschar  Also assessed L ankle pressure- more superficial -less red and covered slough- 50% came off with dressing- pink underneath    Neurologic: Cranial nerves II through XII intact, motor strength is 4/5 in bilateral deltoid, bicep, tricep, grip,  Difficult to perform MMT in LEs had increase knee flexor tone limiting ROM Actually spasms SLIGHTLY less frequent this AM- ~ q45 seconds vs q20 seconds-  Sensory exam normal sensation to light touch lower extremities Musculoskeletal: Bilateral hip flexor and knee flexor contracture vs MAS 4 spasticity- hamstrings very tight    Assessment/Plan: 1. 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  Physiatrist is providing close team supervision and 24 hour management of active medical problems listed below.  Physiatrist and rehab team continue to assess barriers to discharge/monitor patient progress toward functional and medical goals  Care Tool:  Bathing    Body parts bathed by patient: Chest, Abdomen, Face   Body parts bathed by helper: Right arm, Left arm, Front perineal area, Buttocks, Right upper leg, Left upper leg, Right lower leg, Left lower leg     Bathing assist Assist Level: Total Assistance - Patient < 25%     Upper Body Dressing/Undressing Upper body dressing   What is the patient wearing?: Pull over shirt    Upper body assist Assist Level: Total Assistance - Patient < 25%    Lower Body Dressing/Undressing Lower body dressing      What is the patient wearing?: Incontinence brief, Pants     Lower body assist Assist for lower body dressing: Dependent - Patient 0%     Toileting Toileting    Toileting assist Assist for toileting: Dependent - Patient 0%     Transfers Chair/bed transfer  Transfers assist     Chair/bed transfer assist level: Dependent - mechanical lift(maxi move)     Locomotion Ambulation   Ambulation assist   Ambulation activity did not occur: Safety/medical concerns          Walk 10 feet activity   Assist  Walk 10 feet activity did not occur: Safety/medical concerns        Walk 50 feet activity   Assist Walk 50 feet with 2 turns activity did not  occur: Safety/medical concerns         Walk 150 feet activity   Assist Walk 150 feet activity did not occur: Safety/medical concerns         Walk 10 feet on uneven surface  activity   Assist Walk 10 feet on uneven surfaces activity did not occur: Safety/medical concerns         Wheelchair     Assist Will patient use wheelchair at discharge?: Yes Type of Wheelchair: Power Wheelchair activity did not  occur: Safety/medical concerns  Wheelchair assist level: Supervision/Verbal cueing Max wheelchair distance: 500'    Wheelchair 50 feet with 2 turns activity    Assist    Wheelchair 50 feet with 2 turns activity did not occur: Safety/medical concerns   Assist Level: Supervision/Verbal cueing   Wheelchair 150 feet activity     Assist  Wheelchair 150 feet activity did not occur: Safety/medical concerns   Assist Level: Supervision/Verbal cueing   Blood pressure (!) 92/50, pulse 60, temperature 98.4 F (36.9 C), resp. rate 18, height 5\' 7"  (1.702 m), weight 95.7 kg, SpO2 94 %.  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 Status post L3-4 lumbar laminectomy decompression -patient may  shower -ELOS/Goals: one person assist ADL and mobility goals, heal skin to stage 2 or better if possible- hard to keep pt on side due to spasms  5/25- asked to get w/c eval; done asap since 1 of reaosns pt back to rehab/CIR  5/28- W/C eval wil be Monday  -pt wants to be DNR- changed order 2. Antithrombotics: -DVT/anticoagulation:Subcutaneous heparin. Recent venous Doppler studies negative.  5/25- changed to lovenox since renal fxn ok -antiplatelet therapy: Aspirin 81 mg daily 3. Pain Management:Baclofen 30 mg 3 times daily and 40 mg nightly, Zanaflex 2 mg 3 times daily, Neurontin 100 mg nightly, hydrocodone and Robaxin as needed. Imitrex as needed for headaches Would consider Botox 150-200 Units each hamstring  5/26- will try to get it done- either here or outpt.   5/27- will d/c zanaflex per pt request- says he knows it was for spasticity  5/28- spasticity slightly better off Zanaflex 4. Mood:Trazodone as needed -antipsychotic agents: N/A 5. Neuropsych: This patientiscapable of making decisions on hisown behalf. 6.  Skin/Wound Care:Apply Santyl ointment to left buttock sacral wound and left lateral ankle wound in a nickel thick layer. Cover with saline moistened gauze then sized appropriate foam dressing changed daily, as discussed with PT/Wound care may benefit from hydrotherapy after 1-2 wks of colllagenase   5/28- L ankle looks slightly bette-r less slough and more superficial 7. Fluids/Electrolytes/Nutrition:Routine in and outs with follow-up chemistries 8. Chronic restless leg syndrome. 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   5/28- better affect/mood- per pt 10. OSA/chronic respiratory failure. Continue CPAP and oxygen as directed with nebulizer treatments  5/25- add duonebs at 8am in morning 11. Neurogenic bowel bladder. Flomax 0.8 mg daily. Check PVR. 12. Hypothyroidism. Synthroid 13. 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 Follow-up outpatient 14. Chronic diastolic congestive heart failure. Monitor for any signs of fluid overload. Lasix 40 mg daily. Continue Aldactone 25 mg daily Vitals:   11/14/19 2220 11/15/19 0346  BP:  (!) 92/50  Pulse: 63 60  Resp: 16 18  Temp:  98.4 F (36.9 C)  SpO2: 94% 94%   15. Hypertension. Toprol-XL 50 mg daily.  5/26- BP well controlled- con't meds/regimen, will reduce toprol to 25mg   16. Hyperlipidemia. Lipitor 17. Gout. Zyloprim 300 mg daily.  5/25- change to colchicine since acute gout Sx' and add Indomethacin 50 mg BID with meals for pain x 10 doses  5/27- gout Sx's better- con't regimen 18. Earwax  5/27- ordered debrox B/L ears- x 5 days  5/28= pt reports no better so far- had 1 dose 19. Nasal stuffiness  5/28- ordered Afrin BID PRN- since used TOO much at home, and didn't respond to flonase at all-     LOS: 5 days A FACE TO [place] E Vicenta Olds 11/15/2019, 10:21 AM

## 2019-11-15 NOTE — Progress Notes (Signed)
Was called to patient's beside with complaints of "not being able to breath good" on his CPAP.  I asked the RN to place patient back on his [place] till I got up to patient.  Patient's BS were [place], and I asked patient to give me a good cough.  Patient stated that he is having trouble getting the mucus up and out.  Will give patient flutter and instructions for use to enable patient to work the mucus up.

## 2019-11-15 NOTE — Progress Notes (Signed)
Occupational Therapy Session Note  Patient Details  Name: [name] MRN: [id number] Date of Birth: 08-Oct-1940  Today's Date: 11/15/2019 OT Individual Time: 5910-2890 OT Individual Time Calculation (min): 60 min   OT Individual Time: 1000-1100 OT Individual Time Calculation (min): 60 min   Short Term Goals: Week 1:  OT Short Term Goal 1 (Week 1): STG = LTG 2/2 ELOS  Skilled Therapeutic Interventions/Progress Updates:  Session 1: Patient met lying supine in bed in agreement with OT treatment session with focus on self-directed care, functional mobility, and functional transfers as detailed below. Patient reporting severe pain in buttocks and LLE at lateral malleolus. RN notified. Patient able to direct this OT in assisting him with UB dressing, LB dressing, and bed mobility rolling R<>L to hike pants over hips. Patient requested to remain supine in bed until RN could change dressings on buttocks and LLE. Session concluded with patient lying supine in bed with call bell within reach, and all needs met.   Session 2: This OT returned to assist patient from supine to [facility] with use of beezy board and green step under BLE. RN present throughout treatment session to replace dressings on bottom. Family education with focus on bed mobility and Hoyer lift. Wife reports history with use of Hoyer lift. Wife unable to return demonstrate transfers with use of lift as RN still present in room providing patient care. OT assisted with doffing LB clothing for RN to change dressings to buttocks with patient requiring +2 assist. Session concluded with patient lying supine in bed with call bell within reach, wife present at bedside, and all needs met.   Therapy Documentation Precautions:  Precautions Precautions: Fall, Back Precaution Booklet Issued: Yes (comment) Precaution Comments: L lateral ankle ulcer, L buttock/sacral ulcer Restrictions Weight Bearing Restrictions: No General:    Therapy/Group:  Individual Therapy  Nahia Nissan R [place]Davis 11/15/2019, 6:51 AM

## 2019-11-16 ENCOUNTER — Encounter (HOSPITAL_COMMUNITY): Payer: Medicare Other | Admitting: Occupational Therapy

## 2019-11-16 ENCOUNTER — Ambulatory Visit (HOSPITAL_COMMUNITY): Payer: Medicare Other

## 2019-11-16 NOTE — Progress Notes (Signed)
Occupational Therapy Session Note  Patient Details  Name: [name] MRN: [id number] Date of Birth: 04/09/1941  Today's Date: 11/16/2019 OT Individual Time: 0903-1002 OT Individual Time Calculation (min): 59 min    Short Term Goals: Week 1:  OT Short Term Goal 1 (Week 1): STG = LTG 2/2 ELOS  Skilled Therapeutic Interventions/Progress Updates:    Patient in bed - sidelying position, finishing breakfast.  He notes pain at level 7 in lower back and sacral area.  On 3L O2 via [place] sat 96%, HR 69.  Wife not present for session as anticipated but patient happy to review and practice hoyer lift transfer.  Dependent to donn teds and slipper socks.  Reviewed use of hoyer lift and sling placement.  He requires max a to roll and place hoyer sling - discussed important factors for him to direct t/o process.  Reviewed TIS w/c placement and strategies to reduce LE pain with managing hoyer.  Reviewed tilt use on power chair/weight shift and positioning.  He remained seated in TIS power chair in reclined position.  Call bell and tray table in reach.    Therapy Documentation Precautions:  Precautions Precautions: Fall, Back Precaution Booklet Issued: Yes (comment) Precaution Comments: L lateral ankle ulcer, L buttock/sacral ulcer Restrictions Weight Bearing Restrictions: No  Therapy/Group: Individual Therapy  [name] 11/16/2019, 7:31 AM

## 2019-11-16 NOTE — Progress Notes (Signed)
Physical Therapy Session Note  Patient Details  Name: [name] MRN: [id number] Date of Birth: 28-Feb-1941  Today's Date: 11/16/2019 PT Individual Time: 1300-1400 PT Individual Time Calculation (min): 60 min   Short Term Goals: Week 1:  PT Short Term Goal 1 (Week 1): =LTG due to ELOS  Skilled Therapeutic Interventions/Progress Updates:     Patient in power w/c on 2L/min O2  with his wife in the room upon PT arrival. Patient alert and agreeable to PT session. Patient reported 2-3/10 sacral pain during session, RN made aware. PT provided repositioning, rest breaks, and distraction as pain interventions throughout session.   Focused session on family education on home management, equipment needs, resources for transportation, respiratory care, positioning and pressure relief, and power w/c mobility.   Discussed equipment needs with patient an his wife, plan for power chair evaluation tomorrow. Provided education on expectations for evaluation, discussed various surfaces in patient's home, has mostly carpeted floors and gravel driveway. Patient will be using a w/c accessible [employer]i through his church that is shared between members in need. Discussed also filing paperwork for accessible transport through the city as an alternative option for medical appointments as needed, encouraged patient an his wife to dicussed with CSW. Educated on pressure relief strategies while in the power chair and bed, patient with concerns about turning in bed, as he states that he did not do this enough before and this caused a pressure injury that is currently healing. Discussed possible wound care follow-up at d/c with home nurse as needed, however reported that patient's wife may be trained in changing the dressing if the wound does not require nursing follow-up and educated signs and symptoms of infection. Discussed use of O2 at home and safety with power chair and O2 line. Patient with questions about rescue inhaler, PT  differed to MD, wrote question on the white board for patient to ask in the morning. Educated on importance of OOB sitting and use of respirtory devices, IS and flutter valve, to reduce risk of pneumonia and other respiratory complications.   Patient's wife with questions about bathing dressing. Discussed techniques for bed level ADLs, however, differed to OT and suggested setting up OT family education time for ADLs, will pass along to team lead. Patient's wife reports that she feels comfortable using the [place] and declined practicing with the lift today. Also states that most of the transfers are done with training caregivers as well. She also had questions about a hospital bed that is to be ordered, differed to [place]. Instructed patient's wife to bring in a list of questions for CSW about equipment and home services to be provided at d/c.   Wheelchair Mobility:  Patient propelled the power wheelchair >250 feet with supervision performed 50 degree and 180 degree turns and backing into the room. Provided verbal cues for use of control panel, educated on being unable to propel with w/c tilted back, demonstrated adjustment of speed and patient able to use during mobility, remained between 0.4-0.6 mph. Patient was independent with use of the power chair for pressure relief during education due to sacral pain.   Patient in power chair on 2L/min O2 with his wife in the room at end of session with breaks locked and all needs within reach.    Therapy Documentation Precautions:  Precautions Precautions: Fall, Back Precaution Booklet Issued: Yes (comment) Precaution Comments: L lateral ankle ulcer, L buttock/sacral ulcer Restrictions Weight Bearing Restrictions: No    Therapy/Group: Individual Therapy  Ashayla Subia L Deedee Lybarger PT, DPT  11/16/2019, 4:10 PM

## 2019-11-16 NOTE — Plan of Care (Signed)
  Problem: Consults Goal: RH GENERAL PATIENT EDUCATION Description: See Patient Education module for education specifics. Outcome: Progressing Goal: Skin Care Protocol Initiated - if Braden Score 18 or less Description: If consults are not indicated, leave blank or document N/A Outcome: Progressing Goal: Nutrition Consult-if indicated Outcome: Progressing Goal: Diabetes Guidelines if Diabetic/Glucose > 140 Description: If diabetic or lab glucose is > 140 mg/dl - Initiate Diabetes/Hyperglycemia Guidelines & Document Interventions  Outcome: Progressing   Problem: RH BLADDER ELIMINATION Goal: RH STG MANAGE BLADDER WITH ASSISTANCE Description: STG Manage Bladder With total Assistance Outcome: Progressing   Problem: RH SKIN INTEGRITY Goal: RH STG SKIN FREE OF INFECTION/BREAKDOWN Outcome: Progressing Goal: RH STG MAINTAIN SKIN INTEGRITY WITH ASSISTANCE Description: STG Maintain Skin Integrity With max Assistance. Outcome: Progressing Goal: RH STG ABLE TO PERFORM INCISION/WOUND CARE W/ASSISTANCE Description: STG Able To Perform Incision/Wound Care With max Assistance. Outcome: Progressing   Problem: RH SAFETY Goal: RH STG ADHERE TO SAFETY PRECAUTIONS W/ASSISTANCE/DEVICE Description: STG Adhere to Safety Precautions With max Assistance/Device. Outcome: Progressing Goal: RH STG DECREASED RISK OF FALL WITH ASSISTANCE Description: STG Decreased Risk of Fall With min Assistance. Outcome: Progressing   Problem: RH PAIN MANAGEMENT Goal: RH STG PAIN MANAGED AT OR BELOW PT'S PAIN GOAL Description: Less than 3 out of 10 Outcome: Progressing   Problem: Consults Goal: RH SPINAL CORD INJURY PATIENT EDUCATION Description:  See Patient Education module for education specifics.  Outcome: Progressing   

## 2019-11-16 NOTE — Progress Notes (Signed)
[facility] PHYSICAL MEDICINE & REHABILITATION PROGRESS NOTE   Subjective/Complaints:  Snoring , per RN lept last noc   ROS:  Pt denies SOB, abd pain, CP, N/V/C/D, and vision changes    Objective:   No results found. No results for input(s): WBC, HGB, HCT, PLT in the last 72 hours. No results for input(s): NA, K, CL, CO2, GLUCOSE, BUN, CREATININE, CALCIUM in the last 72 hours.  Intake/Output Summary (Last 24 hours) at 11/16/2019 0726 Last data filed at 11/16/2019 0605 Gross per 24 hour  Intake 600 ml  Output 1950 ml  Net -1350 ml     Physical Exam: Vital Signs Blood pressure 135/76, pulse 66, temperature 97.7 F (36.5 C), resp. rate 18, height 5\' 7"  (1.702 m), weight 95.7 kg, SpO2 97 %.   Physical Exam  General: No acute distress Mood and affect are appropriate Heart: Regular rate and rhythm no rubs murmurs or extra sounds Lungs: Clear to auscultation, breathing unlabored, no rales or wheezes Abdomen: Positive bowel sounds, soft nontender to palpation, nondistended Extremities: No clubbing, cyanosis, or edema Skin: No evidence of breakdown, no evidence of rash  Skin: Sacral decubitus irregular  ~10cm at widest point stages 1, 3 and unstageable portion under eschar      Neurologic: Cranial nerves II through XII intact, motor strength is 4/5 in bilateral deltoid, bicep, tricep, grip,   Sensory exam normal sensation to light touch lower extremities Musculoskeletal: Bilateral hip flexor and knee flexor contracture vs MAS 4 spasticity- hamstrings very tight    Assessment/Plan: 1. 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  Physiatrist is providing close team supervision and 24 hour management of active medical problems listed below.  Physiatrist and  rehab team continue to assess barriers to discharge/monitor patient progress toward functional and medical goals  Care Tool:  Bathing    Body parts bathed by patient: Chest, Abdomen, Face   Body parts bathed by helper: Right arm, Left arm, Front perineal area, Buttocks, Right upper leg, Left upper leg, Right lower leg, Left lower leg     Bathing assist Assist Level: Total Assistance - Patient < 25%     Upper Body Dressing/Undressing Upper body dressing   What is the patient wearing?: Pull over shirt    Upper body assist Assist Level: Total Assistance - Patient < 25%    Lower Body Dressing/Undressing Lower body dressing      What is the patient wearing?: Incontinence brief, Pants     Lower body assist Assist for lower body dressing: Dependent - Patient 0%     Toileting Toileting    Toileting assist Assist for toileting: Dependent - Patient 0%     Transfers Chair/bed transfer  Transfers assist     Chair/bed transfer assist level: Dependent - mechanical lift(maxi move)     Locomotion Ambulation   Ambulation assist   Ambulation activity did not occur: Safety/medical concerns          Walk 10 feet activity   Assist  Walk 10 feet activity did not occur: Safety/medical concerns        Walk 50 feet activity   Assist Walk 50 feet with 2 turns activity did not occur: Safety/medical concerns         Walk 150 feet activity   Assist Walk 150 feet activity did not occur: Safety/medical concerns  Walk 10 feet on uneven surface  activity   Assist Walk 10 feet on uneven surfaces activity did not occur: Safety/medical concerns         Wheelchair     Assist Will patient use wheelchair at discharge?: Yes Type of Wheelchair: Power Wheelchair activity did not occur: Safety/medical concerns  Wheelchair assist level: Supervision/Verbal cueing Max wheelchair distance: 500'    Wheelchair 50 feet with 2 turns  activity    Assist    Wheelchair 50 feet with 2 turns activity did not occur: Safety/medical concerns   Assist Level: Supervision/Verbal cueing   Wheelchair 150 feet activity     Assist  Wheelchair 150 feet activity did not occur: Safety/medical concerns   Assist Level: Supervision/Verbal cueing   Blood pressure 135/76, pulse 66, temperature 97.7 F (36.5 C), resp. rate 18, height 5\' 7"  (1.702 m), weight 95.7 kg, SpO2 97 %.  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 Status post L3-4 lumbar laminectomy decompression -patient may  shower -ELOS/Goals: one person assist ADL and mobility goals, tent d/c 6/4  5/28- W/C eval wil be Monday  -pt wants to be DNR- changed order 2. Antithrombotics: -DVT/anticoagulation:Subcutaneous heparin. Recent venous Doppler studies negative.  5/25- changed to lovenox since renal fxn ok -antiplatelet therapy: Aspirin 81 mg daily 3. Pain Management:Baclofen 30 mg 3 times daily and 40 mg nightly, Zanaflex 2 mg 3 times daily, Neurontin 100 mg nightly, hydrocodone and Robaxin as needed. Imitrex as needed for headaches Would consider Botox 150-200 Units each hamstring  5/26- will try to get it done- either here or outpt.   5/27- will d/c zanaflex per pt request- says he knows it was for spasticity  5/28- spasticity slightly better off Zanaflex 4. Mood:Trazodone as needed -antipsychotic agents: N/A 5. Neuropsych: This patientiscapable of making decisions on hisown behalf. 6. Skin/Wound Care:Apply Santyl ointment to left buttock sacral wound and left lateral ankle wound in a nickel thick layer. Cover with saline moistened gauze then sized appropriate foam dressing changed daily, as discussed with PT/Wound care may benefit from hydrotherapy after 1-2 wks of colllagenase   5/28- L  ankle looks slightly bette-r less slough and more superficial 7. Fluids/Electrolytes/Nutrition:Routine in and outs with follow-up chemistries 8. Chronic restless leg syndrome. 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   5/28- better affect/mood- per pt 10. OSA/chronic respiratory failure. Continue CPAP and oxygen as directed with nebulizer treatments  5/25- add duonebs at 8am in morning 11. Neurogenic bowel bladder. Flomax 0.8 mg daily. Check PVR. 12. Hypothyroidism. Synthroid 13. 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 Follow-up outpatient 14. Chronic diastolic congestive heart failure. Monitor for any signs of fluid overload. Lasix 40 mg daily. Continue Aldactone 25 mg daily Vitals:   11/16/19 0311 11/16/19 0552  BP:  135/76  Pulse:  66  Resp:    Temp:  97.7 F (36.5 C)  SpO2: 95% 97%   15. Hypertension. Toprol-XL 50 mg daily.  5/26- BP well controlled- con't meds/regimen, will reduce toprol to 25mg  , HR and BP in range cont. To monitor  16. Hyperlipidemia. Lipitor 17. Gout. Zyloprim 300 mg daily.  5/25- change to colchicine since acute gout Sx' and add Indomethacin 50 mg BID with meals for pain x 10 doses  5/27- gout Sx's better-  con't regimen 18. Earwax  5/27- ordered debrox B/L ears- x 5 days  5/28= pt reports no better so far- had 1 dose 19. Nasal stuffiness  5/28- ordered Afrin BID PRN- since used TOO much at home, and didn't respond to flonase at all-     LOS: 6 days A FACE TO [place] E [name] 11/16/2019, 7:26 AM

## 2019-11-16 NOTE — Progress Notes (Signed)
Mr. [name] stated that he does not want any RTs to be in his room alone with him. He demanded that I get a witness in the room with me while I place him on his CPAP. He would not tell me what happened or what was wrong. NT, Diamond, accompanied me back into his room and he was placed on his CPAP.

## 2019-11-17 ENCOUNTER — Inpatient Hospital Stay (HOSPITAL_COMMUNITY): Payer: Medicare Other

## 2019-11-17 ENCOUNTER — Inpatient Hospital Stay (HOSPITAL_COMMUNITY): Payer: Medicare Other | Admitting: Physical Therapy

## 2019-11-17 MED ORDER — ALBUTEROL SULFATE (2.5 MG/3ML) 0.083% IN NEBU: 2.5000 mg | INHALATION_SOLUTION | RESPIRATORY_TRACT | Status: DC | PRN

## 2019-11-17 MED ORDER — ALBUTEROL SULFATE HFA 108 (90 BASE) MCG/ACT IN AERS: 2.0000 | INHALATION_SPRAY | RESPIRATORY_TRACT | Status: DC | PRN

## 2019-11-17 NOTE — Progress Notes (Signed)
Physical Therapy Session Note  Patient Details  Name: [name] MRN: [id number] Date of Birth: February 28, 1941  Today's Date: 11/17/2019 PT Individual Time: 0845-1000 PT Individual Time Calculation (min): 75 min   Short Term Goals: Week 1:  PT Short Term Goal 1 (Week 1): =LTG due to ELOS  Skilled Therapeutic Interventions/Progress Updates:    Pt received seated in bed, agreeable to PT session. Assisted pt with donning pants at bed level dependently with mod A for rolling R/L. Supine to sit with max A for BLE management and trunk control. Once seated EOB pt initially requires min A for sitting balance due to lateral lean to the R increasing to max A to maintain upright trunk with onset of fatigue. Present during therapy session is Deberah Pelton, [employer] from [employer] to assess pt for power wheelchair mobility. Pt and his wife [name] are able to actively participate in evaluation and discuss power w/c options as well as state preferences with regards to controls, positioning, pressure relief, etc. Returned pt to supine with max A for BLE management and some trunk control. Once back in supine pt is mod A for rolling R/L for placement of maxi move sling. Maxi move transfer bed to power w/c. Once seated in power w/c pt is able to perform tilt and recline to position chair comfortably as well as maneuver himself to park next to the bed. Pt on 2L O2 throughout session, vitals WNL. Pt left semi-reclined in power w/c in room with needs in reach, wife present.  Therapy Documentation Precautions:  Precautions Precautions: Fall, Back Precaution Booklet Issued: Yes (comment) Precaution Comments: L lateral ankle ulcer, L buttock/sacral ulcer Restrictions Weight Bearing Restrictions: No    Therapy/Group: Individual Therapy   Excell Seltzer, PT, DPT  11/17/2019, 4:39 PM

## 2019-11-17 NOTE — Progress Notes (Signed)
[facility] PHYSICAL MEDICINE & REHABILITATION PROGRESS NOTE   Subjective/Complaints:  Pt asleep- woke for a few seconds- went back to sleep- had note on board asking about toenails to be cut.   And asking about rescue inhalers.     ROS: Limited due to sedation  Objective:   No results found. No results for input(s): WBC, HGB, HCT, PLT in the last 72 hours. No results for input(s): NA, K, CL, CO2, GLUCOSE, BUN, CREATININE, CALCIUM in the last 72 hours.  Intake/Output Summary (Last 24 hours) at 11/17/2019 0944 Last data filed at 11/17/2019 0440 Gross per 24 hour  Intake 500 ml  Output 2300 ml  Net -1800 ml     Physical Exam: Vital Signs Blood pressure 135/63, pulse 66, temperature 98.3 F (36.8 C), resp. rate 17, height 5\' 7"  (1.702 m), weight 95.7 kg, SpO2 98 %.   Physical Exam  General: asleep; on O2- sats 94% on O2 by [place], NAD Mood and affect are appropriate Heart: RRR Lungs: post nebs- coarse throughout- adequate air movement B/L  Abdomen:soft, NT, ND, (+)BS Extremities: No clubbing, cyanosis, or edema Skin: L ankle pressure ulcer- no changes- slough over Superficial stage III ulcer Skin: Sacral decubitus irregular  ~10cm at widest point stages 1, 3 and unstageable portion under eschar- could not assess today- due to positioning      Neurologic: Cranial nerves II through XII intact, motor strength is 4/5 in bilateral deltoid, bicep, tricep, grip,   Sensory exam normal sensation to light touch lower extremities Musculoskeletal: Bilateral hip flexor and knee flexor contracture vs MAS 4 spasticity- hamstrings very tight    Assessment/Plan: 1. 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  Physiatrist is providing close team supervision and 24 hour  management of active medical problems listed below.  Physiatrist and rehab team continue to assess barriers to discharge/monitor patient progress toward functional and medical goals  Care Tool:  Bathing    Body parts bathed by patient: Chest, Abdomen, Face   Body parts bathed by helper: Right arm, Left arm, Front perineal area, Buttocks, Right upper leg, Left upper leg, Right lower leg, Left lower leg     Bathing assist Assist Level: Total Assistance - Patient < 25%     Upper Body Dressing/Undressing Upper body dressing   What is the patient wearing?: Pull over shirt    Upper body assist Assist Level: Total Assistance - Patient < 25%    Lower Body Dressing/Undressing Lower body dressing      What is the patient wearing?: Incontinence brief, Pants     Lower body assist Assist for lower body dressing: Dependent - Patient 0%     Toileting Toileting    Toileting assist Assist for toileting: Dependent - Patient 0%     Transfers Chair/bed transfer  Transfers assist     Chair/bed transfer assist level: Dependent - mechanical lift(maxi move)     Locomotion Ambulation   Ambulation assist   Ambulation activity did not occur: Safety/medical concerns          Walk 10 feet activity   Assist  Walk 10 feet activity did not occur: Safety/medical concerns        Walk 50 feet activity   Assist Walk 50 feet with 2 turns activity did not occur: Safety/medical concerns  Walk 150 feet activity   Assist Walk 150 feet activity did not occur: Safety/medical concerns         Walk 10 feet on uneven surface  activity   Assist Walk 10 feet on uneven surfaces activity did not occur: Safety/medical concerns         Wheelchair     Assist Will patient use wheelchair at discharge?: Yes Type of Wheelchair: Power Wheelchair activity did not occur: Safety/medical concerns  Wheelchair assist level: Supervision/Verbal cueing Max wheelchair  distance: 500'    Wheelchair 50 feet with 2 turns activity    Assist    Wheelchair 50 feet with 2 turns activity did not occur: Safety/medical concerns   Assist Level: Supervision/Verbal cueing   Wheelchair 150 feet activity     Assist  Wheelchair 150 feet activity did not occur: Safety/medical concerns   Assist Level: Supervision/Verbal cueing   Blood pressure 135/63, pulse 66, temperature 98.3 F (36.8 C), resp. rate 17, height 5\' 7"  (1.702 m), weight 95.7 kg, SpO2 98 %.  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 Status post L3-4 lumbar laminectomy decompression -patient may  shower -ELOS/Goals: one person assist ADL and mobility goals, tent d/c 6/4    5/28- W/C eval wil be Monday  -pt wants to be DNR- changed order 2. Antithrombotics: -DVT/anticoagulation:Subcutaneous heparin. Recent venous Doppler studies negative.  5/25- changed to lovenox since renal fxn ok -antiplatelet therapy: Aspirin 81 mg daily 3. Pain Management:Baclofen 30 mg 3 times daily and 40 mg nightly, Zanaflex 2 mg 3 times daily, Neurontin 100 mg nightly, hydrocodone and Robaxin as needed. Imitrex as needed for headaches Would consider Botox 150-200 Units each hamstring  5/26- will try to get it done- either here or outpt.   5/27- will d/c zanaflex per pt request- says he knows it was for spasticity  5/28- spasticity slightly better off Zanaflex per pt  5/31- fewer spasms seen when asleep 4. Mood:Trazodone as needed -antipsychotic agents: N/A 5. Neuropsych: This patientiscapable of making decisions on hisown behalf. 6. Skin/Wound Care:Apply Santyl ointment to left buttock sacral wound and left lateral ankle wound in a nickel thick layer. Cover with saline moistened gauze then sized appropriate foam dressing changed daily,  as discussed with PT/Wound care may benefit from hydrotherapy after 1-2 wks of colllagenase   5/28- L ankle looks slightly bette-r less slough and more superficial 7. Fluids/Electrolytes/Nutrition:Routine in and outs with follow-up chemistries 8. Chronic restless leg syndrome. 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   5/28- better affect/mood- per pt 10. OSA/chronic respiratory failure. Continue CPAP and oxygen as directed with nebulizer treatments  5/25- add duonebs at 8am in morning  5/31- will add Albuterol inhalers prn- with spacer 11. Neurogenic bowel bladder. Flomax 0.8 mg daily. Check PVR. 12. Hypothyroidism. Synthroid 13. 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 Follow-up outpatient 14. Chronic diastolic congestive heart failure. Monitor for any signs of fluid overload. Lasix 40 mg daily. Continue Aldactone 25 mg daily Vitals:   11/17/19 0738 11/17/19 0739  BP:    Pulse:    Resp:    Temp:    SpO2: 98% 98%   15. Hypertension. Toprol-XL 50 mg daily.  5/26- BP well controlled- con't meds/regimen, will reduce toprol to 25mg  , HR and BP in  range cont. To monitor  16. Hyperlipidemia. Lipitor 17. Gout. Zyloprim 300 mg daily.  5/25- change to colchicine since acute gout Sx' and add Indomethacin 50 mg BID with meals for pain x 10 doses  5/27- gout Sx's better- con't regimen 18. Earwax  5/27- ordered debrox B/L ears- x 5 days  5/28= pt reports no better so far- had 1 dose 19. Nasal stuffiness  5/28- ordered Afrin BID PRN- since used TOO much at home, and didn't respond to flonase at all-     LOS: 7 days A FACE TO FACE EVALUATION WAS PERFORMED  Megan Lovorn 11/17/2019, 9:44 AM

## 2019-11-17 NOTE — Progress Notes (Signed)
RT NOTES: Patient states he does not want anyone from the respiratory department in his room without a witness. RN witnessed this Probation officer removing CPAP and placing on 2lpm nasal cannula and giving inhalers and neb treatment at this time.

## 2019-11-17 NOTE — Progress Notes (Signed)
Patient ID: JARELLE HAGANS, male   DOB: [birth year], 79 y.o.   MRN: [id number]  SW called pt wife to discuss hospital bed recommendation. SW to order item. Continues to report she would like ambulance transport home. Preferred [facility]- [facility] and [facility].   SW spoke with Donna/[facility] and referral declined due to staffing.   SW waiting on follow-up from Tiffany/[facility].   DME order sent to [facility] via parachute.  Loralee Pacas, MSW, [place] Office: [phone] Cell: [phone] Fax: [phone]

## 2019-11-17 NOTE — Plan of Care (Signed)
  Problem: Consults Goal: RH GENERAL PATIENT EDUCATION Description: See Patient Education module for education specifics. Outcome: Progressing Goal: Skin Care Protocol Initiated - if Braden Score 18 or less Description: If consults are not indicated, leave blank or document N/A Outcome: Progressing Goal: Nutrition Consult-if indicated Outcome: Progressing Goal: Diabetes Guidelines if Diabetic/Glucose > 140 Description: If diabetic or lab glucose is > 140 mg/dl - Initiate Diabetes/Hyperglycemia Guidelines & Document Interventions  Outcome: Progressing   Problem: RH BLADDER ELIMINATION Goal: RH STG MANAGE BLADDER WITH ASSISTANCE Description: STG Manage Bladder With total Assistance Outcome: Progressing   Problem: RH SKIN INTEGRITY Goal: RH STG SKIN FREE OF INFECTION/BREAKDOWN Outcome: Progressing Goal: RH STG MAINTAIN SKIN INTEGRITY WITH ASSISTANCE Description: STG Maintain Skin Integrity With max Assistance. Outcome: Progressing Goal: RH STG ABLE TO PERFORM INCISION/WOUND CARE W/ASSISTANCE Description: STG Able To Perform Incision/Wound Care With max Assistance. Outcome: Progressing   Problem: RH SAFETY Goal: RH STG ADHERE TO SAFETY PRECAUTIONS W/ASSISTANCE/DEVICE Description: STG Adhere to Safety Precautions With max Assistance/Device. Outcome: Progressing Goal: RH STG DECREASED RISK OF FALL WITH ASSISTANCE Description: STG Decreased Risk of Fall With min Assistance. Outcome: Progressing   Problem: RH PAIN MANAGEMENT Goal: RH STG PAIN MANAGED AT OR BELOW PT'S PAIN GOAL Description: Less than 3 out of 10 Outcome: Progressing   Problem: Consults Goal: RH SPINAL CORD INJURY PATIENT EDUCATION Description:  See Patient Education module for education specifics.  Outcome: Progressing   

## 2019-11-17 NOTE — Progress Notes (Signed)
Occupational Therapy Session Note  Patient Details  Name: [name] MRN: [id number] Date of Birth: 10-20-1940  Today's Date: 11/17/2019 OT Individual Time: 1300-1355 OT Individual Time Calculation (min): 55 min    Short Term Goals: Week 1:  OT Short Term Goal 1 (Week 1): STG = LTG 2/2 ELOS  Skilled Therapeutic Interventions/Progress Updates:    Pt resting in PWC upon arrival eating lunch.  OT intervention with focus on BUE AAROM/AROM, activity tolerance and breathing techniques with activity, and PWC operation. Pt pleased with w/c evaluation earlier and looking forward to having PWC at home. Pt with limited L shoulder AROM (~100) and AAROM of ~140. R shoulder WNL. Pt engaged in BUE activity using 1# bar and beach ball, hitting ball back to therapist. Pt educated on breathing strategies/techniques to help maintain O2 > 90% with activity. Pt required min verbal cues during activity to maintain 02>90%. O2<90% X 3 but rebounded within 10 seconds with breathing techniques.  Pt engaged in activity 15X5 during session.  L shoulder AAROM/PROM performed and pt educated on SROM tasks to maintain L shoulder functional range. Pt remained in [place] with all needs within reach.   Therapy Documentation Precautions:  Precautions Precautions: Fall, Back Precaution Booklet Issued: Yes (comment) Precaution Comments: L lateral ankle ulcer, L buttock/sacral ulcer Restrictions Weight Bearing Restrictions: No  Pain:  Pt with no c/o pain this afternoon   Therapy/Group: Individual Therapy  [name] 11/17/2019, 3:03 PM

## 2019-11-18 ENCOUNTER — Encounter (HOSPITAL_COMMUNITY): Payer: Medicare Other | Admitting: Psychology

## 2019-11-18 ENCOUNTER — Inpatient Hospital Stay (HOSPITAL_COMMUNITY): Payer: Medicare Other

## 2019-11-18 ENCOUNTER — Inpatient Hospital Stay (HOSPITAL_COMMUNITY): Payer: Medicare Other | Admitting: Physical Therapy

## 2019-11-18 DIAGNOSIS — G8222 Paraplegia, incomplete: Principal | ICD-10-CM

## 2019-11-18 DIAGNOSIS — R252 Cramp and spasm: Secondary | ICD-10-CM

## 2019-11-18 MED ORDER — ALBUTEROL SULFATE HFA 108 (90 BASE) MCG/ACT IN AERS
1.0000 | INHALATION_SPRAY | RESPIRATORY_TRACT | Status: DC | PRN
  Administered 2019-11-20: 1 via RESPIRATORY_TRACT
  Filled 2019-11-18 (×2): qty 6.7

## 2019-11-18 MED ORDER — ALBUTEROL SULFATE HFA 108 (90 BASE) MCG/ACT IN AERS: 2.0000 | INHALATION_SPRAY | RESPIRATORY_TRACT | Status: DC | PRN

## 2019-11-18 NOTE — Progress Notes (Signed)
Physical Therapy Session Note  Patient Details  Name: [name] MRN: [id number] Date of Birth: 31-May-1941  Today's Date: 11/18/2019 PT Individual Time: 1300-1400 PT Individual Time Calculation (min): 60 min   Short Term Goals: Week 1:  PT Short Term Goal 1 (Week 1): =LTG due to ELOS  Skilled Therapeutic Interventions/Progress Updates:  Pt received seated in power w/c in room, agreeable to PT session. No complaints of pain during session. Session focus on reviewing power wheelchair controls and power wheelchair mobility. Pt is at Supervision level for w/c mobility x 200 ft across level surface on hard, flat ground. Set up obstacle course for pt consisting of navigating through 4 chairs 3 feet apart. Pt demos good ability to maneuver through chairs, decreased distance to 2 feet apart. Pt continues to exhibit good ability to maneuver power w/c through increasingly narrow spaces at Supervision level. Pt is then able to maneuver power w/c through cones set 2 feet apart with min cues for safety and obstacle avoidance. Ascend/descend ramp in power w/c at Supervision level with mod cues for setting up w/c in tilted and reclined position for increased safety when going down ramp. Pt on 2L O2 throughout session, SpO2 remains at 93% and above during session. Reviewed pressure relief schedule of every 30 min x 2 min while seated in wheelchair. Pt left semi-reclined in power w/c in room with needs in reach at end of session.  Therapy Documentation Precautions:  Precautions Precautions: Fall, Back Precaution Booklet Issued: Yes (comment) Precaution Comments: L lateral ankle ulcer, L buttock/sacral ulcer Restrictions Weight Bearing Restrictions: No    Therapy/Group: Individual Therapy   Excell Seltzer, PT, DPT  11/18/2019, 3:47 PM

## 2019-11-18 NOTE — Progress Notes (Addendum)
Per patient request he does not want respiratory in the room alone.  Patient will not give any explanation as to why he has such request. RN, Collier Flowers accompanied this RT in patient's room to apply CPAP.  Patient was placed on CPAP of 7 cmH2O via his home FFM.  Patient is tolerating at this time.  RT will continue to monitor.

## 2019-11-18 NOTE — Plan of Care (Signed)
  Problem: Consults Goal: RH GENERAL PATIENT EDUCATION Description: See Patient Education module for education specifics. Outcome: Progressing Goal: Skin Care Protocol Initiated - if Braden Score 18 or less Description: If consults are not indicated, leave blank or document N/A Outcome: Progressing Goal: Nutrition Consult-if indicated Outcome: Progressing Goal: Diabetes Guidelines if Diabetic/Glucose > 140 Description: If diabetic or lab glucose is > 140 mg/dl - Initiate Diabetes/Hyperglycemia Guidelines & Document Interventions  Outcome: Progressing   Problem: RH BLADDER ELIMINATION Goal: RH STG MANAGE BLADDER WITH ASSISTANCE Description: STG Manage Bladder With total Assistance Outcome: Progressing   Problem: RH SKIN INTEGRITY Goal: RH STG SKIN FREE OF INFECTION/BREAKDOWN Outcome: Progressing Goal: RH STG MAINTAIN SKIN INTEGRITY WITH ASSISTANCE Description: STG Maintain Skin Integrity With max Assistance. Outcome: Progressing Goal: RH STG ABLE TO PERFORM INCISION/WOUND CARE W/ASSISTANCE Description: STG Able To Perform Incision/Wound Care With max Assistance. Outcome: Progressing   Problem: RH SAFETY Goal: RH STG ADHERE TO SAFETY PRECAUTIONS W/ASSISTANCE/DEVICE Description: STG Adhere to Safety Precautions With max Assistance/Device. Outcome: Progressing Goal: RH STG DECREASED RISK OF FALL WITH ASSISTANCE Description: STG Decreased Risk of Fall With min Assistance. Outcome: Progressing   Problem: RH PAIN MANAGEMENT Goal: RH STG PAIN MANAGED AT OR BELOW PT'S PAIN GOAL Description: Less than 3 out of 10 Outcome: Progressing   Problem: Consults Goal: RH SPINAL CORD INJURY PATIENT EDUCATION Description:  See Patient Education module for education specifics.  Outcome: Progressing   

## 2019-11-18 NOTE — Consult Note (Signed)
Neuropsychological Consultation   Patient:   [name]   DOB:   March 09, 1941  MR Number:  [id number]  Location:  [facility] [address] CENTER [address] [place] [id number] [place] [address] Dept: [place]: [phone]           Date of Service:   11/18/2019  Start Time:   2 PM End Time:   3 PM  Provider/Observer:  Ilean Skill, Psy.D.       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  Patient has been followed by Dr. Brett [name] for neurology services as well as neurosurgery.  Patient presented 09/17/2019 with general fatigue over the last couple of days and decreased in functional mobility.  Patient has had a number of difficulties identified including significant issues with cervical and lumbar spine.  Patient has not been a surgical candidate.  Patient placed on steroid interventions.  Chronic pain control issues have been ongoing issue.  The patient had follow-up imaging done and identified severe degenerative issues and mild scoliosis and the CS as well as L3-4 severe spinal stenosis L2-3 moderate events spinal stenosis and L1-2 moderate spinal stenosis.  Patient transferred to acute care services on 10/28/2019 and underwent lumbar 3-4 laminectomy decompression.  Patient has also had issues with development of wounds on his buttock and left ankle wound.  Reason for Service:  The patient was referred for neuropsychological consultation due to coping and adjustment issues with severe chronic illness and acute aspects  related to surgical intervention.  Below see HPI for the current admission.  Below see HPI for the current admission.  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 Brett [name] of neurology services as well as neurosurgery, bowel bladder incontinence. Per chart review lives with spouse 1 level home with ramped entrance. Needed assistance for dressing as well as transfers. He has a PCA who does provide assistance. He was able to ambulate short household distances with a walker. Presented 09/17/2019 with generalized fatigue over the last couple of days and decrease in functional mobility. Cranial CT scan was negative for acute changes. Admission chemistries BUN 43, creatinine 1.64, WBC 13,000, troponin negative, urinalysis negative nitrite with urine culture 30,000 diphtheroids, blood cultures no growth to date. MRI thoracic lumbar cervical spine showed chronic changes with patchy cord signal abnormality at the C3-4 level consistent with chronic myelomalacia. Multilevel degenerative disc disease of the lower thoracic spine without high-grade spinal stenosis as well as lumbar spondylosis and congenitally short pedicles causing prominent impingement at L2-3 and 3-4 with moderate impingement L1-2 and 4-5 as well as T12-L1 and L5-S1. Impingement of L1-2 and 2-3 minimally worsened from prior exam 07/24/2017. Neurosurgery Dr. Kristeen [name] consulted in regards to MRI  findings patient not felt to be a surgical candidate advised IV  Solu-Medrol then transition to prolonged prednisone with taper. Echocardiogram with ejection fraction 60% no wall motion abnormalities. Chronic pain control ongoing with the use of scheduled baclofen titrated to 15 mg 3 times daily as well as nightly Neurontin and OxyContin 10 mg every 12 hours. Therapy evaluations completed and patient was admitted to inpatient rehab services 09/26/2019 with slow overall progress. Ongoing care of patient's spasticity with follow-up recommendations followed by neurology as well as neurosurgery and considerations were even made for a possible baclofen pump. Follow-up cervical lumbar thoracic myelogram completed showing severe degenerative with mild scoliosis and listhesis as well as L3-4 severe spinal stenosis L2-3 moderate to advanced spinal stenosis L1-2 moderate spinal stenosis. Resolved cord compression after ACDF. There was some bilateral foraminal narrowing noted. Thoracic spine diffuse degenerative disease without cord compression. Neurosurgery again discussed at length with family in regards to his weakness spasticity myelogram findings recommendations for L3-4 decompression. During patient's rehabilitation stay he did remain on Lovenox for DVT prophylaxis OSA with CPAP chronic oxygen monitoring followed by pulmonary services. Wound care consulted 10/23/2019 for left ankle ulcer as well as left buttock with wound care as directed. Left ankle films were completed showing no evidence of osteomyelitis fracture or other acute abnormality no visible abscess. Patient was transferred to acute care services 10/28/2019 and underwent lumbar 3-4 laminectomy decompression 11/04/2019 per Dr. Venetia [name]. Palliative care was consulted to establish goals of care. He was placed on subcutaneous heparin for DVT prophylaxis. CPAP ongoing as well as oxygen needs for history of OSA/chronic respiratory failure. Wound care ongoing for left buttock sacral wound and left lateral ankle  wound. Therapy evaluations completed and patient was admitted for a comprehensive rehab program.   Current Status:  The patient was seated in his wheelchair upon entering the room.  The patient was alert and oriented.  The patient had numerous complaints about various aspects of his care and concerns about various issues that have happened during his long hospital course.  The patient is clearly very frustrated by not only aspects of his hospitalization but also the significant loss of function and medical complications he is having.  The patient is not coping very well with his need for others to provide care and he gets frustrated both at times when care is initiated as well as when he feels like care is not available or time.  Behavioral Observation: [name]  presents as a 79 y.o.-year-old Right Caucasian Male who appeared his stated age. his dress was Appropriate and he was Well Groomed and his manners were Appropriate to the situation.  his participation was indicative of Appropriate and Redirectable behaviors.  There were any physical disabilities noted.  he displayed an appropriate level of cooperation and motivation.     Interactions:    Active Appropriate and Redirectable  Attention:   abnormal and attention span appeared shorter than expected for age  Memory:   within normal limits; recent and remote memory intact  Visuo-spatial:  not examined  Speech (Volume):  low  Speech:   normal; normal  Thought Process:  Coherent and Relevant  Though Content:  WNL; not suicidal and not homicidal  Orientation:   person, place, time/date and situation  Judgment:   Fair  Planning:   Poor  Affect:    Irritable  Mood:    Dysphoric  Insight:   Fair  Intelligence:   normal  Medical History:   Past Medical  History:  Diagnosis Date  . Arthritis   . Barrett esophagus   . Bronchitis   . Carotid artery occlusion   . COPD (chronic obstructive pulmonary disease) ([place])   .  Diabetes mellitus without complication ([place])   . GERD (gastroesophageal reflux disease)   . Headache    migraines  . History of thyroid cancer 2008  . Hypertension   . Restless leg   . Sleep apnea with use of continuous positive airway pressure (CPAP)    2011 [facility] , [facility]  77cn central and obstrcutive. 16 cm water , 3 cm EPR.   . Thoracic ascending aortic aneurysm ([facility])    4.2 cm ascending TAA 08/18/17 CTA. Annual imaging recommended.  . thyroid ca dx'd 2009 or 2010   surg and radioactive isoptope  . Ulcer    Peptic ulcer disease        Abuse/Trauma History: While the patient has not described any significant abuse or trauma history he has been through a lot medically over the recent past and is very frustrated about his significant loss of function and debility.  Psychiatric History:  No prior psychiatric history  Family Med/Psych History:  Family History  Problem Relation Age of Onset  . COPD Mother   . Hyperlipidemia Mother   . Hypertension Mother   . Diabetes Father   . Kidney disease Father        ESRD  . Hypertension Father   . Cancer Father   . 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  Patient has been followed by Dr. Brett [name] for neurology services as well as neurosurgery.  Patient presented 09/17/2019 with general fatigue over the last couple of days and decreased in functional mobility.  Patient has had a number of difficulties identified including significant issues with cervical and lumbar spine.  Patient has not been a surgical candidate.  Patient placed on steroid interventions.  Chronic pain  control issues have been ongoing issue.  The patient had follow-up imaging done and identified severe degenerative issues and mild scoliosis and the CS as well as L3-4 severe spinal stenosis L2-3 moderate events spinal stenosis and L1-2 moderate spinal stenosis.  Patient transferred to acute care services on 10/28/2019 and underwent lumbar 3-4 laminectomy decompression.  Patient has also had issues with development of wounds on his buttock and left ankle wound.  The patient was seated in his wheelchair upon entering the room.  The patient was alert and oriented.  The patient had numerous complaints about various aspects of his care and concerns about various issues that have happened during his long hospital course.  The patient is clearly very frustrated by not only aspects of his hospitalization but also the significant loss of function and medical complications he is having.  The patient is not coping very well with his need for others to provide care and he gets frustrated both at times when care is initiated as well as when he feels like care is not available or time.  Disposition/Plan:  The patient is clearly very frustrated about numerous issues including aspects of his hospital care treatment but also issues in general.  I have talked with the assistant nursing director about listening  to him in some of his complaints that he has about baseline care and seeing if there are any aspects of his concerns that could be addressed.  Diagnosis:    Cervical myelopathy ([place]) - Plan: Ambulatory referral to Physical Medicine Rehab  Shortness of breath - Plan: CANCELED: DG Chest 2 View, CANCELED: DG Chest 2 View         Electronically Signed   _______________________ Ilean Skill, Psy.D.

## 2019-11-18 NOTE — Progress Notes (Signed)
RT placed patient on CPAP HS. 4L O2 bleed in needed. Patient tolerating well at this time.  

## 2019-11-18 NOTE — Progress Notes (Signed)
Occupational Therapy Session Note  Patient Details  Name: CAJUN BAYERS MRN: [id number] Date of Birth: [birth year]  Today's Date: 11/18/2019 OT Individual Time: 0900-1000 OT Individual Time Calculation (min): 60 min    Short Term Goals: Week 1:  OT Short Term Goal 1 (Week 1): STG = LTG 2/2 ELOS  Skilled Therapeutic Interventions/Progress Updates:    Pt resting in bed upon arrival.  Pt's wife and caregiver present for education.  OT intervention with focus on use of Hoyer lift for transfers.  Pt currently has Hoyer lift and has used it in the past.  Pt's wife states the sling they have at home is similar to sling used hear. Caregiver and wife assisted therapist with transfer to [facility]. Recommended two person transfer at all times.  Caregiver and wife verbalized understanding.  Demonstrated positioning in [place]. Discussed pressure relief schedule. Pt independent with instructing caregiver/wife regarding positioning and pressure relief.  Pt remained in [place] with all needs within reach.  Wife and caregiver present.   Therapy Documentation Precautions:  Precautions Precautions: Fall, Back Precaution Booklet Issued: Yes (comment) Precaution Comments: L lateral ankle ulcer, L buttock/sacral ulcer Restrictions Weight Bearing Restrictions: No Pain:     Therapy/Group: Individual Therapy  [name] 11/18/2019, 12:14 PM

## 2019-11-18 NOTE — Progress Notes (Addendum)
[place] PHYSICAL MEDICINE & REHABILITATION PROGRESS NOTE   Subjective/Complaints:  Pt more awake today- asking about rescue inhalers and getting toenails cut- explained happy to order rescue inhalers 2-4 puffs however can't cut toenails.   Rates pain 8/10- was in same position "all night".  Feels he didn't get turned frequently.   ROS:  Pt denies SOB, abd pain, CP, N/V/C/D, and vision changes   Objective:   No results found. No results for input(s): WBC, HGB, HCT, PLT in the last 72 hours. No results for input(s): NA, K, CL, CO2, GLUCOSE, BUN, CREATININE, CALCIUM in the last 72 hours.  Intake/Output Summary (Last 24 hours) at 11/18/2019 0916 Last data filed at 11/18/2019 0500 Gross per 24 hour  Intake 340 ml  Output 2450 ml  Net -2110 ml     Physical Exam: Vital Signs Blood pressure 103/70, pulse 85, temperature 97.6 F (36.4 C), temperature source Oral, resp. rate 16, height 5\' 7"  (1.702 m), weight 96.2 kg, SpO2 96 %.   Physical Exam  General: asleep; On O2 by [place]- sats 93% Mood and affect are appropriate Heart: RRR Lungs: pre nebs- very coarse thorughout with intermittent wheezing- - junky-   Abdomen: Soft, NT, ND, (+)BS   Extremities: No clubbing, cyanosis, or edema Skin: L ankle pressure ulcer- no changes- slough over Superficial stage III ulcer Skin: Sacral decubitus irregular  ~10cm at widest point stagesand unstageable portion under eschar- but around unstageable is deep stage III   Neurologic: Cranial nerves II through XII intact, motor strength is 4/5 in bilateral deltoid, bicep, tricep, grip,   Sensory exam normal sensation to light touch lower extremities Musculoskeletal: Bilateral hip flexor and knee flexor contracture vs MAS 4 spasticity- hamstrings very tight    Assessment/Plan: 1. Functional deficits secondary to cervical myelopathy and LE weakness from lumbar compression s/p L3/4 laminectomy/decompression with spasticity- severe which require  3+ hours per day of interdisciplinary therapy in a comprehensive inpatient rehab setting.  Physiatrist is providing close team supervision and 24 hour management of active medical problems listed below.  Physiatrist and rehab team continue to assess barriers to discharge/monitor patient progress toward functional and medical goals  Care Tool:  Bathing    Body parts bathed by patient: Chest, Abdomen, Face   Body parts bathed by helper: Right arm, Left arm, Front perineal area, Buttocks, Right upper leg, Left upper leg, Right lower leg, Left lower leg     Bathing assist Assist Level: Total Assistance - Patient < 25%     Upper Body Dressing/Undressing Upper body dressing   What is the patient wearing?: Pull over shirt    Upper body assist Assist Level: Total Assistance - Patient < 25%    Lower Body Dressing/Undressing Lower body dressing      What is the patient wearing?: Incontinence brief, Pants     Lower body assist Assist for lower body dressing: Dependent - Patient 0%     Toileting Toileting    Toileting assist Assist for toileting: Dependent - Patient 0%     Transfers Chair/bed transfer  Transfers assist     Chair/bed transfer assist level: Dependent - mechanical lift     Locomotion Ambulation   Ambulation assist   Ambulation activity did not occur: Safety/medical concerns          Walk 10 feet activity   Assist  Walk 10 feet activity did not occur: Safety/medical concerns        Walk 50 feet activity   Assist Walk  50 feet with 2 turns activity did not occur: Safety/medical concerns         Walk 150 feet activity   Assist Walk 150 feet activity did not occur: Safety/medical concerns         Walk 10 feet on uneven surface  activity   Assist Walk 10 feet on uneven surfaces activity did not occur: Safety/medical concerns         Wheelchair     Assist Will patient use wheelchair at discharge?: Yes Type of  Wheelchair: Power Wheelchair activity did not occur: Safety/medical concerns  Wheelchair assist level: Supervision/Verbal cueing Max wheelchair distance: 500'    Wheelchair 50 feet with 2 turns activity    Assist    Wheelchair 50 feet with 2 turns activity did not occur: Safety/medical concerns   Assist Level: Supervision/Verbal cueing   Wheelchair 150 feet activity     Assist  Wheelchair 150 feet activity did not occur: Safety/medical concerns   Assist Level: Supervision/Verbal cueing   Blood pressure 103/70, pulse 85, temperature 97.6 F (36.4 C), temperature source Oral, resp. rate 16, height 5\' 7"  (1.702 m), weight 96.2 kg, SpO2 96 %.  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 Status post L3-4 lumbar laminectomy decompression -patient may  shower -ELOS/Goals: one person assist ADL and mobility goals, tent d/c 6/4    5/28- W/C eval wil be Monday  6/1- will make sure pt turned every 2 hours or so when in bed  -pt wants to be DNR- changed order 2. Antithrombotics: -DVT/anticoagulation:Subcutaneous heparin. Recent venous Doppler studies negative.  5/25- changed to lovenox since renal fxn ok -antiplatelet therapy: Aspirin 81 mg daily 3. Pain Management:Baclofen 30 mg 3 times daily and 40 mg nightly, Zanaflex 2 mg 3 times daily, Neurontin 100 mg nightly, hydrocodone and Robaxin as needed. Imitrex as needed for headaches Would consider Botox 150-200 Units each hamstring  5/26- will try to get it done- either here or outpt.   5/27- will d/c zanaflex per pt request- says he knows it was for spasticity  5/28- spasticity slightly better off Zanaflex per pt 4. Mood:Trazodone as needed -antipsychotic agents: N/A 5. Neuropsych: This patientiscapable of making decisions on hisown behalf. 6. 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 Cover with saline moistened gauze then sized appropriate foam dressing changed daily, as discussed with PT/Wound care may benefit from hydrotherapy after 1-2 wks of colllagenase   5/28- L ankle looks slightly bette-r less slough and more superficial  6/1- truly needs low air loss mattress at home due to wound and to be turned frequently.  7. Fluids/Electrolytes/Nutrition:Routine in and outs with follow-up chemistries 8. Chronic restless leg syndrome. 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   5/28- better affect/mood- per pt 10. OSA/chronic respiratory failure. Continue CPAP and oxygen as directed with nebulizer treatments  5/25- add duonebs at 8am in morning  5/31- will add Albuterol inhalers prn- with spacer  6/1- will make 2-4 puffs q3 hours prn 11. Neurogenic bowel bladder. Flomax 0.8 mg daily. Check PVR. 12. Hypothyroidism. Synthroid 13. 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 Follow-up outpatient 14. Chronic diastolic congestive heart  failure. Monitor for any signs of fluid overload. Lasix 40 mg daily. Continue Aldactone 25 mg daily Vitals:   11/17/19 1931 11/18/19 0526  BP: 104/65 103/70  Pulse: 79 85  Resp:  16  Temp: 98.5 F (36.9 C) 97.6 F (36.4 C)  SpO2: 94% 96%   15. Hypertension. Toprol-XL 50 mg daily.  5/26- BP well controlled- con't meds/regimen, will reduce toprol to 25mg  , HR and BP in range cont. To monitor  16. Hyperlipidemia. Lipitor 17. Gout. Zyloprim 300 mg daily.  5/25- change to colchicine since acute gout Sx' and add Indomethacin 50 mg BID with meals for pain x 10 doses  5/27- gout Sx's better- con't  regimen  6/1- no complaints anymore 18. Earwax  5/27- ordered debrox B/L ears- x 5 days  5/28= pt reports no better so far- had 1 dose 19. Nasal stuffiness  5/28- ordered Afrin BID PRN- since used TOO much at home, and didn't respond to flonase at all-     LOS: 8 days A FACE TO FACE EVALUATION WAS PERFORMED  [name] 11/18/2019, 9:16 AM

## 2019-11-19 ENCOUNTER — Inpatient Hospital Stay (HOSPITAL_COMMUNITY): Payer: Medicare Other | Admitting: Physical Therapy

## 2019-11-19 ENCOUNTER — Inpatient Hospital Stay (HOSPITAL_COMMUNITY): Payer: Medicare Other

## 2019-11-19 NOTE — Progress Notes (Signed)
[place] PHYSICAL MEDICINE & REHABILITATION PROGRESS NOTE   Subjective/Complaints:   Pt reports didn't need inhalers yesterday "but has it at nursing station".   sats 94%- spasms right now a little better- hit L knee though on bedrail and was bleeding from pressure.    ROS:  Pt denies SOB, abd pain, CP, N/V/C/D, and vision changes   Objective:   No results found. No results for input(s): WBC, HGB, HCT, PLT in the last 72 hours. No results for input(s): NA, K, CL, CO2, GLUCOSE, BUN, CREATININE, CALCIUM in the last 72 hours.  Intake/Output Summary (Last 24 hours) at 11/19/2019 0845 Last data filed at 11/19/2019 0444 Gross per 24 hour  Intake 720 ml  Output 1850 ml  Net -1130 ml     Physical Exam: Vital Signs Blood pressure 99/63, pulse 78, temperature 97.8 F (36.6 C), temperature source Oral, resp. rate 18, height 5\' 7"  (1.702 m), weight 96.2 kg, SpO2 93 %.   Physical Exam  General: awake, sitting up on bed, on R side; sats  94% Mood and affect are appropriate Heart: RRR Lungs: pre-nebs- very coarse still- stable Abdomen: Soft, NT, ND, (+)BS   Extremities: No clubbing, cyanosis, or edema Skin: L ankle pressure ulcer- no changes- slough over Superficial stage III ulcer Skin: Sacral decubitus irregular  ~10cm at widest point stagesand unstageable portion under eschar- but around unstageable is deep stage III L knee- dressing - was bleeding   Neurologic: Cranial nerves II through XII intact, motor strength is 4/5 in bilateral deltoid, bicep, tricep, grip,   Sensory exam normal sensation to light touch lower extremities Musculoskeletal: Bilateral hip flexor and knee flexor contracture vs MAS 4 spasticity- hamstrings very tight    Assessment/Plan: 1. 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  Physiatrist is providing close team supervision and 24 hour management of active medical problems listed below.  Physiatrist and rehab team continue to assess barriers to discharge/monitor patient progress toward functional and medical goals  Care Tool:  Bathing    Body parts bathed by patient: Chest, Abdomen, Face   Body parts bathed by helper: Right arm, Left arm, Front perineal area, Buttocks, Right upper leg, Left upper leg, Right lower leg, Left lower leg     Bathing assist Assist Level: Total Assistance - Patient < 25%     Upper Body Dressing/Undressing Upper body dressing   What is the patient wearing?: Pull over shirt    Upper body assist Assist Level: Total Assistance - Patient < 25%    Lower Body Dressing/Undressing Lower body dressing      What is the patient wearing?: Incontinence brief, Pants     Lower body assist Assist for lower body dressing: Dependent - Patient 0%     Toileting Toileting    Toileting assist Assist for toileting: Dependent - Patient 0%     Transfers Chair/bed transfer  Transfers assist     Chair/bed transfer assist level: Dependent - mechanical lift     Locomotion Ambulation   Ambulation assist   Ambulation activity did not occur: Safety/medical concerns          Walk 10 feet activity   Assist  Walk 10 feet activity did not occur: Safety/medical concerns        Walk 50 feet activity   Assist Walk 50 feet with 2  turns activity did not occur: Safety/medical concerns         Walk 150 feet activity   Assist Walk 150 feet activity did not occur: Safety/medical concerns         Walk 10 feet on uneven surface  activity   Assist Walk 10 feet on uneven surfaces activity did not occur: Safety/medical concerns         Wheelchair     Assist Will patient use wheelchair at discharge?: Yes Type of Wheelchair: Power Wheelchair activity did not occur:  Safety/medical concerns  Wheelchair assist level: Supervision/Verbal cueing Max wheelchair distance: 200'    Wheelchair 50 feet with 2 turns activity    Assist    Wheelchair 50 feet with 2 turns activity did not occur: Safety/medical concerns   Assist Level: Supervision/Verbal cueing   Wheelchair 150 feet activity     Assist  Wheelchair 150 feet activity did not occur: Safety/medical concerns   Assist Level: Supervision/Verbal cueing   Blood pressure 99/63, pulse 78, temperature 97.8 F (36.6 C), temperature source Oral, resp. rate 18, height 5\' 7"  (1.702 m), weight 96.2 kg, SpO2 93 %.  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 Status post L3-4 lumbar laminectomy decompression -patient may  shower -ELOS/Goals: one person assist ADL and mobility goals, tent d/c 6/4    5/28- W/C eval wil be Monday  6/1- will make sure pt turned every 2 hours or so when in bed  -pt wants to be DNR- changed order 2. Antithrombotics: -DVT/anticoagulation:Subcutaneous heparin. Recent venous Doppler studies negative.  5/25- changed to lovenox since renal fxn ok -antiplatelet therapy: Aspirin 81 mg daily 3. Pain Management:Baclofen 30 mg 3 times daily and 40 mg nightly, Zanaflex 2 mg 3 times daily, Neurontin 100 mg nightly, hydrocodone and Robaxin as needed. Imitrex as needed for headaches Would consider Botox 150-200 Units each hamstring  5/26- will try to get it done- either here or outpt.   5/27- will d/c zanaflex per pt request- says he knows it was for spasticity  5/28- spasticity slightly better off Zanaflex per pt 4. Mood:Trazodone as needed -antipsychotic agents: N/A 5. Neuropsych: This patientiscapable of making decisions on hisown behalf. 6. Skin/Wound Care: deep stage III sacral wound- with unstageable  eschar as well- Apply Santyl ointment to left buttock sacral wound and left lateral ankle wound in a nickel thick layer. Cover with saline moistened gauze then sized appropriate foam dressing changed daily, as discussed with PT/Wound care may benefit from hydrotherapy after 1-2 wks of colllagenase   5/28- L ankle looks slightly bette-r less slough and more superficial  6/1- truly needs low air loss mattress at home due to wound and to be turned frequently.  7. Fluids/Electrolytes/Nutrition:Routine in and outs with follow-up chemistries 8. Chronic restless leg syndrome. 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   5/28- better affect/mood- per pt 10. OSA/chronic respiratory failure. Continue CPAP and oxygen as directed with nebulizer treatments  5/25- add duonebs at 8am in morning  5/31- will add Albuterol inhalers prn- with spacer  6/1- will make 2-4 puffs q3 hours prn 11. Neurogenic bowel bladder. Flomax 0.8 mg daily. Check PVR. 12. Hypothyroidism. Synthroid 13. 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 Follow-up outpatient 14. Chronic diastolic congestive heart failure. Monitor for any  signs of fluid overload. Lasix 40 mg daily. Continue Aldactone 25 mg daily Vitals:   11/19/19 0441 11/19/19 0825  BP: (!) 108/54 99/63  Pulse: 66 78  Resp: 18   Temp: 97.8 F (36.6 C)   SpO2: 93%    15. Hypertension. Toprol-XL 50 mg daily.  5/26- BP well controlled- con't meds/regimen, will reduce toprol to 25mg  , HR and BP in range cont. To monitor   6/2- BP well controlled- con't meds 16. Hyperlipidemia. Lipitor 17. Gout. Zyloprim 300 mg daily.  5/25- change to colchicine since acute gout Sx' and add Indomethacin 50 mg BID with meals for pain x 10 doses  5/27- gout Sx's better- con't regimen  6/1- no complaints  anymore 18. Earwax  5/27- ordered debrox B/L ears- x 5 days  5/28= pt reports no better so far- had 1 dose 19. Nasal stuffiness  5/28- ordered Afrin BID PRN- since used TOO much at home, and didn't respond to flonase at all-  20. Dispo  6/2- d/c Friday    LOS: 9 days A FACE TO FACE EVALUATION WAS PERFORMED  Demetrios Byron 11/19/2019, 8:45 AM

## 2019-11-19 NOTE — Progress Notes (Signed)
Patient up in chair for most of the day and between therapy, Offered to tilt chair for pressure relief - patient said he would manage tilting periodically. He stated he would tilt chair back for about 5 minutes every 30 min-1 hour per therapy instructions throughout day.

## 2019-11-19 NOTE — Progress Notes (Addendum)
Patient ID: [name], male   DOB: 07/20/1940, 79 y.o.   MRN: [id number]  [facility] declined [facility] referral due extensive wound care and pt being too complex.   SW sent referral to [facility]. SW waiting on follow-up.   SW scheduled [facility] ambulance pick up for 12pm to home. SW called pt wife susan [phone]) to inform on transport home. Discussed hospital bed delivery. Wife states hospital bed to be delivered tomorrow. SW discussed home o2. States pt has o2 through [place]. SW updated on challenege with [facility] and SW will inform once pt has an agency. She also asked if pt wil/ d/c with a foley, and if she was rwequried to come to hospital for d/c instructions- would prefer to have updates via telephone and updates on medications as well. SW relayed to medical team.   SW spok with [facility] ([phone]/[phone]) who states pt has all oxygen needed and has a tank at the home to fill portable tanks. States we can send updated sats and if pt requires more tanks they will send to pt.    Loralee Pacas, MSW, [place] Office: [phone] Cell: [phone] Fax: [phone]

## 2019-11-19 NOTE — Progress Notes (Signed)
Occupational Therapy Session Note  Patient Details  Name: [name] MRN: [id number] Date of Birth: 04-06-1941  Today's Date: 11/19/2019 OT Individual Time: 1300-1355 OT Individual Time Calculation (min): 55 min    Short Term Goals: Week 2:  OT Short Term Goal 1 (Week 2): STG=LTG secondary to ELOS  Skilled Therapeutic Interventions/Progress Updates:    Pt resting in [facility] upon arrival and agreeable to therapy.  O2 removed for UB bathing/dressing at w/c level. See below for O2 sats. Pt required min A for UB dressing tasks.  Pt completed UB bathing tasks without assistance.  Pt engaged in [place] therex with 1# and 2# bar hitting small beach ball back to therapist. Min verbal cues for breathing techniques during activity. Pt completed 15 X 3 with 1# and 15X4 with 2#. Pt remained in [place] with all needs within reach.     Patient saturations on room air at rest =89% Patient saturations on room air during BUE exercises and UB bathing/dressing tasks = 86%%  Briefly explain why patient needs home oxygen: O2 sats on RA consistently less then 90% and <85% during dressing tasks and bed mobility  Therapy Documentation Precautions:  Precautions Precautions: Fall, Back Precaution Booklet Issued: Yes (comment) Precaution Comments: L lateral ankle ulcer, L buttock/sacral ulcer Restrictions Weight Bearing Restrictions: No Pain:  Pt states his L lateral ankle hurts all the time (unrated); RN aware and repositioned   Therapy/Group: Individual Therapy  [name] 11/19/2019, 2:49 PM

## 2019-11-19 NOTE — Progress Notes (Signed)
Physical Therapy Session Note  Patient Details  Name: MANCE BRANSCOMB MRN: [id number] Date of Birth: [birth year]  Today's Date: 11/19/2019 PT Individual Time: 0900-1000 PT Individual Time Calculation (min): 60 min   Short Term Goals: Week 1:  PT Short Term Goal 1 (Week 1): =LTG due to ELOS  Skilled Therapeutic Interventions/Progress Updates:    Pt received supine in bed, agreeable to PT session. Pt reports 7/10 pain in L hip and low back region, RN notified and working on providing pain medication to patient. Pt requires assist x 2 to don pants at bed level with mod A for rolling L/R. Placement of maxi move sling at bed level with mod A for rolling R/L. Maxi move transfer bed to power w/c. Pt on 2L O2 during session, SpO2 remains at 90% and above. Pt left semi-reclined in TIS w/c at end of session.  Therapy Documentation Precautions:  Precautions Precautions: Fall, Back Precaution Booklet Issued: Yes (comment) Precaution Comments: L lateral ankle ulcer, L buttock/sacral ulcer Restrictions Weight Bearing Restrictions: No    Therapy/Group: Individual Therapy   Excell Seltzer, PT, DPT  11/19/2019, 12:12 PM

## 2019-11-19 NOTE — Patient Care Conference (Signed)
Inpatient RehabilitationTeam Conference and Plan of Care Update Date: 11/19/2019   Time: 9:41 AM    Patient Name: [name]      Medical Record Number: [id number]  Date of Birth: Jul 19, 1940 Sex: Male         Room/Bed: 4M12C/4M12C-01 Payor Info: Payor: [employer] MEDICARE / Plan: [facility] MEDICARE / Product Type: *No Product type* /    Admit Date/Time:  11/10/2019  7:01 PM  Primary Diagnosis:  Cervical myelopathy ([place])  Patient Active Problem List   Diagnosis Date Noted  . Palliative care by specialist   . DNR (do not resuscitate)   . Acute on chronic respiratory failure with hypoxia ([place]) 10/29/2019  . Incomplete paraplegia ([place]) 10/28/2019  . Labile blood pressure   . Spasticity   . Muscle spasm   . Hypoxia   . Supplemental oxygen dependent   . Neurogenic bladder   . Chronic diastolic congestive heart failure ([place])   . Neurogenic bowel   . Hypocalcemia   . Spinal stenosis of lumbar region   . Cervical myelopathy ([place]) 09/26/2019  . Diabetes mellitus type 2, controlled, with complications ([place]) [id number]  . Obese 09/17/2019  . Thoracic aortic aneurysm ([place]) 09/17/2019  . Bowel incontinence 09/17/2019  . Bladder incontinence 09/17/2019  . Right hemiparesis ([place]) 09/17/2019  . Acute renal failure (ARF) ([place]) 09/17/2019  . Left hemiparesis ([place]) 09/17/2019  . Chronic respiratory failure with hypoxia ([place]) 09/17/2019  . Syncope and collapse 09/17/2019  . Chronic diastolic CHF (congestive heart failure) ([place]) 09/04/2019  . PVD (peripheral vascular disease) ([place]) 02/14/2019  . Essential hypertension 02/13/2019  . Non-insulin treated type 2 diabetes mellitus ([place]) 02/13/2019  . Severe sepsis ([place]) 01/17/2019  . Community acquired pneumonia of right lower lobe of lung 01/17/2019  . Postural urinary incontinence 05/20/2018  . Myelopathy concurrent with and due to spinal stenosis of cervical region ([place]) 05/20/2018  . Myoclonic jerking 05/20/2018  . 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 History of respiratory failure 08/18/2017  . Hypokalemia 08/18/2017  . Leukocytosis 08/18/2017  . Muscle atrophy of lower extremity 08/09/2017  . Bowel and bladder incontinence 08/09/2017  . Right-sided muscle weakness 08/09/2017  . Neuropathy 08/09/2017  . 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/2017  . Abnormality of gait 07/12/2017  . Myoclonic jerkings, 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/2017  . Acquired right foot drop 07/12/2017  . UTI (urinary tract infection) due to Enterococcus 07/12/2017  . Pressure ulcer 06/21/2017  . Acute cystitis 06/20/2017  . Weakness generalized 06/20/2017  . Dehydration 06/20/2017  . Dyspnea 12/26/2016  . Chronic cough 12/26/2016  . Hypersomnia with sleep apnea 10/30/2016  . CSA (central sleep apnea) 10/30/2016  . Wheezing symptom 10/30/2016  . 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 RLS (restless legs syndrome) 08/25/2014  . Primary gout 08/25/2014  . OSA on CPAP 08/25/2014  . Severe obesity (BMI >= 40) ([place]) 08/25/2014  . Obesity (BMI 30-39.9) 02/18/2013  . 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 S/P TKR (total knee replacement) 11/29/2012  . History of thyroid cancer     Expected Discharge Date: Expected Discharge Date: 11/21/19  Team Members Present: Physician leading conference: Dr. Courtney Heys Care Coodinator Present: Loralee Pacas, LCSWA;Other (comment)(Telesia Ates Creig Hines, RN, BSN, CRRN) Nurse Present: Serena Croissant, LPN PT Present: Canary Brim, PT OT Present: Roanna Epley, COTA SLP Present: Charolett Bumpers, SLP PPS Coordinator present : Ileana Ladd, Burna Mortimer, [place]  Current Status/Progress Goal Weekly Team Focus  Bowel/Bladder   Pt has foley and is incontinent of bowel  Pt to become more aware of need to have bowel movement  Assess q shift and prn   Swallow/Nutrition/ Hydration             ADL's   bed  mobility-mod A rolling, max A supine>sit EOB; transfers with maxi move  sitting balance-min A; pt will tolerate transfers OOB X 2/day; pt will verbalize pressure relief techniques and direct care givers  educatoin, activity tolerance, sitting balance   Mobility   mod A rolling, max A supine to/from sit, transfers via maxi move, Supervision power w/c mobility  min A rolling, dependent transfers via lift, Supervision w/c mobility, family education  family education with wife [name], power w/c evaluation, endurance   Communication             Safety/Cognition/ Behavioral Observations            Pain   Patient complains of pain in lower legs and left ankle  Pain level less than 3  Assess q shift and prn   Skin   Patient has wounds to sacrum and lateral left ankle covered with foam  Prevent further skin breakdown and promote healing  Assess skin q shift and prn    Rehab Goals Patient on target to meet rehab goals: Yes *See Care Plan and progress notes for long and short-term goals.     Barriers to Discharge  Current Status/Progress Possible Resolutions Date Resolved   Nursing                  PT  Other (comments)  pain, spasticity              OT                  SLP                Care Coordinator Decreased caregiver support;Lack of/limited family support              Discharge Planning/Teaching Needs:  D/c to home with support from his wife; Aide services 3hrs AM/PM; d/c extended to allow wife more time for family education d/t changes with is health  Family education as recommended by therapy   Team Discussion:  Nursing to continue family education on dressing changes and foley care with wife and 2nd caregiver. PT and OT to continue family education with wife and 2nd caregiver with safe transfers, positioning of patient. Patient will need medical transport to his home at discharge.  Revisions to Treatment Plan:  n/a    Medical Summary Current Status: Norco for pain this am-  taught wife to do dressing changed- focused on inhalers; has foley Weekly Focus/Goal: OT- sitting balance  and OOB goal-practice hoyer lift- were using hoyer before; bed mod A and max A supine to sit; good at pressure relief in pressure w/c.  Barriers to Discharge: New oxygen;Incontinence;Neurogenic Bowel & Bladder;Home enviroment access/layout;Decreased family/caregiver support;Weight;Wound care;Weight bearing restrictions  Barriers to Discharge Comments: need O2 for home; will document; Possible Resolutions to Barriers: PT w/c eval done yesterday- ongoing fam ed- will need medical transport home.   Continued Need for Acute Rehabilitation Level of Care: The patient requires daily medical management by a physician with specialized training in physical medicine and rehabilitation for the following reasons: Direction of a multidisciplinary physical rehabilitation program to maximize functional independence : Yes Medical management of patient stability  for increased activity during participation in an intensive rehabilitation regime.: Yes Analysis of laboratory values and/or radiology reports with any subsequent need for medication adjustment and/or medical intervention. : Yes   I attest that I was present, lead the team conference, and concur with the assessment and plan of the team.   Cristi Loron 11/19/2019, 9:41 AM

## 2019-11-19 NOTE — Discharge Summary (Signed)
Physician Discharge Summary  Patient ID: CHRISTIANJACOB MCCREDIE MRN: [id number] DOB/AGE: 1940/11/17 79 y.o.  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 Spinal stenosis. Consideration of baclofen pump. FLUOROSCOPY TIME:  1.8 minutes.  95.7 mGy air kerma PROCEDURE: LUMBAR PUNCTURE FOR CERVICAL LUMBAR AND THORACIC MYELOGRAM CERVICAL AND LUMBAR AND THORACIC MYELOGRAM CT CERVICAL MYELOGRAM CT LUMBAR MYELOGRAM CT THORACIC MYELOGRAM Written and oral informed consent was obtained. Consent was obtained by Dr. Monte Fantasia. Time-out was performed. Patient was positioned prone on the fluoroscopy table. Local anesthesia was provided with 1% lidocaine without epinephrine after prepped and draped in the usual sterile fashion. Puncture was performed at L5-S1 using a 3 1/2 inch 22-gauge spinal needle via midline approach. Using a single pass through the dura, the needle was placed within the thecal sac, with return of clear CSF. 10 mL Omnipaque 300 was injected into the thecal sac, with normal opacification of the nerve roots and cauda equina consistent with free flow within the subarachnoid space. Patient cannot stand or or easily positioned on the table due to spasms and joint flexion in the lower extremity. Contrast was advanced into the cervical region in the lateral decubitus position. I personally performed the lumbar  puncture and administered the intrathecal contrast. I also personally supervised acquisition of the myelogram images. TECHNIQUE: Contiguous axial images were obtained through the Cervical, Thoracic, and Lumbar spine after the intrathecal infusion of infusion. Coronal and sagittal reconstructions were obtained of the axial image sets. FINDINGS: CERVICAL AND LUMBAR MYELOGRAM FINDINGS: Myelography was only used for positioning of intrathecal contrast, which flowed freely into the upper cervical spine. L3-4 severe disc space degeneration with vacuum phenomenon and disc space obliteration. There is severe stenosis at this level. CT CERVICAL MYELOGRAM FINDINGS: Alignment: Mild anterolisthesis C6-7 Vertebrae: C3-C6 ACDF with solid arthrodesis. Bridging osteophytes ankylosis the C6-7 and C7-T1 spaces. There is also ankylosis across the right facet and intervertebral space of C2-3. Cord: Suboptimal intrathecal contrast which has layered since myelography. 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 Extraspinal: No evidence of inflammation or mass.  Thyroidectomy. Disc levels: C1-2: Advanced right-sided facet osteoarthritis with joint space narrowing and potential C2 impingement. C2-3: Ankylosis.  No visible impingement C3-4: ACDF. Patent canal. Residual ridging causes moderate left foraminal narrowing. C4-5: ACDF with solid arthrodesis. Patent spinal canal. The foramina appear sufficiently patent C5-6: ACDF. Uncovertebral and facet spurring causes moderate right foraminal narrowing. 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 C6-7: Facet spurring and anterolisthesis. Disc narrowing. Mild bilateral foraminal narrowing. Patent spinal canal C7-T1:Facet spurring and ankylosis. Bridging intervertebral ankylosis. CT THORACIC MYELOGRAM FINDINGS: Alignment: Exaggerated thoracic kyphosis. Vertebrae: No evidence of  fracture or bone lesion. Cord: Suboptimal intrathecal contrast in the upper thoracic levels. No cord compression. Facet spurring in the lower thoracic levels mildly encroaches on the posterolateral cord. Extra-spinal: Left lower lobe and multi segment left upper lobe collapse with large pleural effusion. There is also right-sided atelectasis and volume loss. Cardiomegaly.  Disc levels: Diffuse degenerative disc narrowing and ventral spurring. Bridging osteophytes are seen at multiple levels, including T6-7, T8-9, and T10-11. Facet spurs encroach on lower thoracic foramina without focal high-grade involvement CT LUMBAR MYELOGRAM FINDINGS: Segmentation: 5 lumbar type vertebrae. Alignment: Mild dextroscoliosis. Trace retrolisthesis at L2-3 and L3-4. Vertebrae: Extensive vertebral body sclerosis attributed to degenerative disease. No evidence of fracture or bone lesion. Extra-spinal: Extensive atherosclerotic plaque on the aorta. Conus: Tip terminates at L1-2.  No detected swelling Disc levels: T12- L1: Spondylosis.  No impingement L1-L2: Disc narrowing and bulging with endplate ridging. Moderate spinal stenosis. L2-L3: Degenerative disc collapse with endplate sclerosis and posterior ridging. High-grade spinal stenosis. Moderate left more than right foraminal narrowing L3-L4: Severe disc degeneration with disc space obliteration and ridging. Bilateral facet spurring and ligamentum flavum thickening. Advanced spinal stenosis. Right more than left foraminal impingement L4-L5: Advanced disc narrowing with right-sided collapse and endplate ridging. Facet osteoarthritis with bulky spurring on the right where there is ankylosis. Left more than right subarticular recess narrowing. Biforaminal impingement. L5-S1:Disc narrowing and ridging. Right facet osteoarthritis. Moderate right foraminal narrowing. IMPRESSION: Lumbar spine: 1. 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 2. 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 3.  L2-3 moderate to advanced spinal stenosis. 4. L1-2 moderate spinal stenosis. 5. Bilateral foraminal narrowings described above. Cervical spine: 1. Resolved cord compression after ACDF. There is solid arthrodesis from C3-C6 and degenerative ankylosis at C2-3, C6-7, and C7-T1. 2. Bilateral foraminal narrowings described above. 3. Large left pleural effusion with collapse of the majority of the left lung. Thoracic spine: 1. Diffuse degenerative disease without cord compression. 2. Multilevel spondylitic ankylosis. Electronically Signed   By: Monte Fantasia M.D.   On: 10/22/2019 09:43   CT THORACIC SPINE WO CONTRAST  Result Date: 10/22/2019 CLINICAL DATA:  Worsening spasticity. Spinal stenosis. Consideration of baclofen pump. FLUOROSCOPY TIME:  1.8 minutes.  95.7 mGy air kerma PROCEDURE: LUMBAR PUNCTURE FOR CERVICAL LUMBAR AND THORACIC MYELOGRAM CERVICAL AND LUMBAR AND THORACIC MYELOGRAM CT CERVICAL MYELOGRAM CT LUMBAR MYELOGRAM CT THORACIC MYELOGRAM Written and oral informed consent was obtained. Consent was obtained by Dr. Monte Fantasia. Time-out was performed. Patient was positioned prone on the fluoroscopy table. Local anesthesia was provided with 1% lidocaine without epinephrine after prepped and draped in the usual sterile fashion. Puncture was performed at L5-S1 using a 3 1/2 inch 22-gauge spinal needle via midline approach. Using a single pass through the dura, the needle was placed within the thecal sac, with return of clear CSF. 10 mL Omnipaque 300 was injected into the thecal sac, with normal opacification of the nerve roots and cauda equina consistent with free flow within the subarachnoid space. Patient cannot stand or or easily positioned on the table due to spasms and joint flexion in the lower extremity. Contrast was advanced into the cervical region in the lateral decubitus position. I personally performed the lumbar puncture and administered the intrathecal contrast. I also personally supervised  acquisition of the myelogram images. TECHNIQUE: Contiguous axial images were obtained through the Cervical, Thoracic, and Lumbar spine after the intrathecal infusion of infusion. Coronal and sagittal reconstructions were obtained of the axial image sets. FINDINGS: CERVICAL AND LUMBAR MYELOGRAM FINDINGS: Myelography was only used for positioning of intrathecal contrast, which flowed freely into the upper cervical spine. L3-4 severe disc space degeneration with vacuum phenomenon and disc space obliteration. There is severe stenosis at this level. CT CERVICAL MYELOGRAM FINDINGS: Alignment: Mild anterolisthesis C6-7 Vertebrae: C3-C6 ACDF with solid arthrodesis. Bridging osteophytes ankylosis the  C6-7 and C7-T1 spaces. There is also ankylosis across the right facet and intervertebral space of C2-3. Cord: Suboptimal intrathecal contrast which has layered since myelography. 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 Extraspinal: No evidence of inflammation or mass.  Thyroidectomy. Disc levels: C1-2: Advanced right-sided facet osteoarthritis with joint space narrowing and potential C2 impingement. C2-3: Ankylosis.  No visible impingement C3-4: ACDF. Patent canal. Residual ridging causes moderate left foraminal narrowing. C4-5: ACDF with solid arthrodesis. Patent spinal canal. The foramina appear sufficiently patent C5-6: ACDF. Uncovertebral and facet spurring causes moderate right foraminal narrowing. 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 C6-7: Facet spurring and anterolisthesis. Disc narrowing. Mild bilateral foraminal narrowing. Patent spinal canal C7-T1:Facet spurring and ankylosis. Bridging intervertebral ankylosis. CT THORACIC MYELOGRAM FINDINGS: Alignment: Exaggerated thoracic kyphosis. Vertebrae: No evidence of fracture or bone lesion. Cord: Suboptimal intrathecal contrast in the upper thoracic  levels. No cord compression. Facet spurring in the lower thoracic levels mildly encroaches on the posterolateral cord. Extra-spinal: Left lower lobe and multi segment left upper lobe collapse with large pleural effusion. There is also right-sided atelectasis and volume loss. Cardiomegaly. Disc levels: Diffuse degenerative disc narrowing and ventral spurring. Bridging osteophytes are seen at multiple levels, including T6-7, T8-9, and T10-11. Facet spurs encroach on lower thoracic foramina without focal high-grade involvement CT LUMBAR MYELOGRAM FINDINGS: Segmentation: 5 lumbar type vertebrae. Alignment: Mild dextroscoliosis. Trace retrolisthesis at L2-3 and L3-4. Vertebrae: Extensive vertebral body sclerosis attributed to degenerative disease. No evidence of fracture or bone lesion. Extra-spinal: Extensive atherosclerotic plaque on the aorta. Conus: Tip terminates at L1-2.  No detected swelling Disc levels: T12- L1: Spondylosis.  No impingement L1-L2: Disc narrowing and bulging with endplate ridging. Moderate spinal stenosis. L2-L3: Degenerative disc collapse with endplate sclerosis and posterior ridging. High-grade spinal stenosis. Moderate left more than right foraminal narrowing L3-L4: Severe disc degeneration with disc space obliteration and ridging. Bilateral facet spurring and ligamentum flavum thickening. Advanced spinal stenosis. Right more than left foraminal impingement L4-L5: Advanced disc narrowing with right-sided collapse and endplate ridging. Facet osteoarthritis with bulky spurring on the right where there is ankylosis. Left more than right subarticular recess narrowing. Biforaminal impingement. L5-S1:Disc narrowing and ridging. Right facet osteoarthritis. Moderate right foraminal narrowing. IMPRESSION: Lumbar spine: 1. 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 2. 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 3. L2-3 moderate to advanced spinal stenosis. 4. L1-2 moderate spinal stenosis. 5.  Bilateral foraminal narrowings described above. Cervical spine: 1. Resolved cord compression after ACDF. There is solid arthrodesis from C3-C6 and degenerative ankylosis at C2-3, C6-7, and C7-T1. 2. Bilateral foraminal narrowings described above. 3. Large left pleural effusion with collapse of the majority of the left lung. Thoracic spine: 1. Diffuse degenerative disease without cord compression. 2. Multilevel spondylitic ankylosis. Electronically Signed   By: Monte Fantasia M.D.   On: 10/22/2019 09:43   CT LUMBAR SPINE WO CONTRAST  Result Date: 10/22/2019 CLINICAL DATA:  Worsening spasticity. Spinal stenosis. Consideration of baclofen pump. FLUOROSCOPY TIME:  1.8 minutes.  95.7 mGy air kerma PROCEDURE: LUMBAR PUNCTURE FOR CERVICAL LUMBAR AND THORACIC MYELOGRAM CERVICAL AND LUMBAR AND THORACIC MYELOGRAM CT CERVICAL MYELOGRAM CT LUMBAR MYELOGRAM CT THORACIC MYELOGRAM Written and oral informed consent was obtained. Consent was obtained by Dr. Monte Fantasia. Time-out was performed. Patient was positioned prone on the fluoroscopy table. Local anesthesia was provided with 1% lidocaine without epinephrine after prepped and draped in the usual sterile fashion. Puncture was performed  at L5-S1 using a 3 1/2 inch 22-gauge spinal needle via midline approach. Using a single pass through the dura, the needle was placed within the thecal sac, with return of clear CSF. 10 mL Omnipaque 300 was injected into the thecal sac, with normal opacification of the nerve roots and cauda equina consistent with free flow within the subarachnoid space. Patient cannot stand or or easily positioned on the table due to spasms and joint flexion in the lower extremity. Contrast was advanced into the cervical region in the lateral decubitus position. I personally performed the lumbar puncture and administered the intrathecal contrast. I also personally supervised acquisition of the myelogram images. TECHNIQUE: Contiguous axial images were  obtained through the Cervical, Thoracic, and Lumbar spine after the intrathecal infusion of infusion. Coronal and sagittal reconstructions were obtained of the axial image sets. FINDINGS: CERVICAL AND LUMBAR MYELOGRAM FINDINGS: Myelography was only used for positioning of intrathecal contrast, which flowed freely into the upper cervical spine. L3-4 severe disc space degeneration with vacuum phenomenon and disc space obliteration. There is severe stenosis at this level. CT CERVICAL MYELOGRAM FINDINGS: Alignment: Mild anterolisthesis C6-7 Vertebrae: C3-C6 ACDF with solid arthrodesis. Bridging osteophytes ankylosis the C6-7 and C7-T1 spaces. There is also ankylosis across the right facet and intervertebral space of C2-3. Cord: Suboptimal intrathecal contrast which has layered since myelography. 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 Extraspinal: No evidence of inflammation or mass.  Thyroidectomy. Disc levels: C1-2: Advanced right-sided facet osteoarthritis with joint space narrowing and potential C2 impingement. C2-3: Ankylosis.  No visible impingement C3-4: ACDF. Patent canal. Residual ridging causes moderate left foraminal narrowing. C4-5: ACDF with solid arthrodesis. Patent spinal canal. The foramina appear sufficiently patent C5-6: ACDF. Uncovertebral and facet spurring causes moderate right foraminal narrowing. 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 C6-7: Facet spurring and anterolisthesis. Disc narrowing. Mild bilateral foraminal narrowing. Patent spinal canal C7-T1:Facet spurring and ankylosis. Bridging intervertebral ankylosis. CT THORACIC MYELOGRAM FINDINGS: Alignment: Exaggerated thoracic kyphosis. Vertebrae: No evidence of fracture or bone lesion. Cord: Suboptimal intrathecal contrast in the upper thoracic levels. No cord compression. Facet spurring in the lower thoracic levels mildly  encroaches on the posterolateral cord. Extra-spinal: Left lower lobe and multi segment left upper lobe collapse with large pleural effusion. There is also right-sided atelectasis and volume loss. Cardiomegaly. Disc levels: Diffuse degenerative disc narrowing and ventral spurring. Bridging osteophytes are seen at multiple levels, including T6-7, T8-9, and T10-11. Facet spurs encroach on lower thoracic foramina without focal high-grade involvement CT LUMBAR MYELOGRAM FINDINGS: Segmentation: 5 lumbar type vertebrae. Alignment: Mild dextroscoliosis. Trace retrolisthesis at L2-3 and L3-4. Vertebrae: Extensive vertebral body sclerosis attributed to degenerative disease. No evidence of fracture or bone lesion. Extra-spinal: Extensive atherosclerotic plaque on the aorta. Conus: Tip terminates at L1-2.  No detected swelling Disc levels: T12- L1: Spondylosis.  No impingement L1-L2: Disc narrowing and bulging with endplate ridging. Moderate spinal stenosis. L2-L3: Degenerative disc collapse with endplate sclerosis and posterior ridging. High-grade spinal stenosis. Moderate left more than right foraminal narrowing L3-L4: Severe disc degeneration with disc space obliteration and ridging. Bilateral facet spurring and ligamentum flavum thickening. Advanced spinal stenosis. Right more than left foraminal impingement L4-L5: Advanced disc narrowing with right-sided collapse and endplate ridging. Facet osteoarthritis with bulky spurring on the right where there is ankylosis. Left more than right subarticular recess narrowing. Biforaminal impingement. L5-S1:Disc narrowing and ridging. Right facet osteoarthritis. Moderate right foraminal narrowing. IMPRESSION: Lumbar spine:  1. 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 2. 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 3. L2-3 moderate to advanced spinal stenosis. 4. L1-2 moderate spinal stenosis. 5. Bilateral foraminal narrowings described above. Cervical spine: 1. Resolved cord  compression after ACDF. There is solid arthrodesis from C3-C6 and degenerative ankylosis at C2-3, C6-7, and C7-T1. 2. Bilateral foraminal narrowings described above. 3. Large left pleural effusion with collapse of the majority of the left lung. Thoracic spine: 1. Diffuse degenerative disease without cord compression. 2. Multilevel spondylitic ankylosis. Electronically Signed   By: Monte Fantasia M.D.   On: 10/22/2019 09:43   DG Lumbar Spine 1 View  Result Date: 11/04/2019 CLINICAL DATA:  Lumbar surgery. EXAM: LUMBAR SPINE - 1 VIEW COMPARISON:  CT 10/22/2019. FINDINGS: Lumbar spine numbered the lowest segmented appearing lumbar shaped vertebrae on lateral view as L5. Surgical instrument is noted posteriorly at the level of L4. IMPRESSION: Postsurgical changes lumbar spine. Electronically Signed   By: Marcello Moores  Register   On: 11/04/2019 15:06   CT ANKLE LEFT WO CONTRAST  Result Date: 10/23/2019 CLINICAL DATA:  Soft tissue wound of the lateral aspect of the left ankle at the lateral malleolus. EXAM: CT OF THE LEFT ANKLE WITHOUT CONTRAST TECHNIQUE: Multidetector CT imaging of the left ankle was performed according to the standard protocol. Multiplanar CT image reconstructions were also generated. COMPARISON:  None. FINDINGS: Bones/Joint/Cartilage There is no evidence of osteomyelitis, fracture, or other acute abnormality of the bones of the ankle. Tiny marginal osteophytes on the distal tibia. Minimal cystic degenerative changes talus at the posterior facet of the subtalar joint. Small intraosseous ganglion cyst in the calcaneus. No ankle or subtalar joint effusions. Ligaments Suboptimally assessed by CT. Not well enough seen for assessment. Muscles and Tendons The tendons around the ankle demonstrate no significant abnormalities. No discrete muscle abnormality. Soft tissues Subcutaneous edema around the ankle, most prominent over the lateral malleolus where there is a superficial soft tissue ulceration. No  visible abscess. IMPRESSION: 1. No evidence of osteomyelitis, fracture, or other acute abnormality of the bones of the ankle. 2. Subcutaneous edema around the ankle, most prominent over the lateral malleolus where there is a superficial soft tissue ulceration. 3. No visible abscess. Electronically Signed   By: Lorriane Shire M.D.   On: 10/23/2019 16:26   DG CHEST PORT 1 VIEW  Result Date: 11/10/2019 CLINICAL DATA:  Follow-up pneumonia. Productive cough. EXAM: PORTABLE CHEST 1 VIEW COMPARISON:  Chest x-rays dated 10/29/2019 and 10/27/2019 FINDINGS: There has been significant reduction in the left pleural effusion with slight residual left effusion and slight elevation of the left hemidiaphragm and probable slight atelectasis at the left base. There is slight thickening along the fissures in the right lung with minimal atelectasis at the right base medially. The heart size and pulmonary vascularity are normal. No acute bone abnormality. IMPRESSION: 1. Significant reduction in the left pleural effusion, now small. 2. Slight residual atelectasis at the left lung base. Electronically Signed   By: Lorriane Shire M.D.   On: 11/10/2019 10:19   DG CHEST PORT 1 VIEW  Result Date: 10/29/2019 CLINICAL DATA:  Acute on chronic respiratory failure with hypoxia. EXAM: PORTABLE CHEST 1 VIEW COMPARISON:  Oct 27, 2019. FINDINGS: Stable cardiomediastinal silhouette. No pneumothorax is noted. Stable left pleural effusion is noted with associated atelectasis or infiltrate. Mild right basilar subsegmental atelectasis is noted. Bony thorax is unremarkable. IMPRESSION: Stable left pleural effusion is noted with associated atelectasis or infiltrate. Mild right basilar subsegmental atelectasis is noted.  Electronically Signed   By: Marijo Conception M.D.   On: 10/29/2019 12:28   DG CHEST PORT 1 VIEW  Result Date: 10/27/2019 CLINICAL DATA:  Increasing shortness of breath. EXAM: PORTABLE CHEST 1 VIEW COMPARISON:  Full-thickness ray  dated Oct 24, 2019. FINDINGS: Stable cardiomediastinal silhouette. Normal pulmonary vascularity. Unchanged small to moderate left pleural effusion with left basilar atelectasis. No pneumothorax. Unchanged elevation of left hemidiaphragm. No acute osseous abnormality. IMPRESSION: 1. Unchanged left pleural effusion with associated basilar atelectasis. Electronically Signed   By: Titus Dubin M.D.   On: 10/27/2019 10:32   DG CHEST PORT 1 VIEW  Result Date: 10/24/2019 CLINICAL DATA:  Wheezing, cough EXAM: PORTABLE CHEST 1 VIEW COMPARISON:  10/14/2019 FINDINGS: Persistent left pleural effusion with associated atelectasis. Improved aeration at the right lung base. Similar cardiomediastinal contours. Surgical clips overlie the neck. Anterior cervical fusion is present. Right rotator cuff repair. IMPRESSION: Persistent left pleural effusion with associated atelectasis. Electronically Signed   By: Macy Mis M.D.   On: 10/24/2019 09:21   DG C-Arm 1-60 Min  Result Date: 11/04/2019 CLINICAL DATA:  L3-L4 laminectomy. EXAM: DG C-ARM 1-60 MIN CONTRAST:  None. FLUOROSCOPY TIME:  Fluoroscopy Time:  0 minutes 9 seconds Number of Acquired Spot Images: 1 COMPARISON:  CT lumbar spine 10/22/2019. FINDINGS: Lumbar spine numbered the lowest segmented appearing lumbar shaped vertebrae on lateral view as L5. Metallic surgical instrument noted posterior to the level of L4. IMPRESSION: Postsurgical changes lumbar spine. Electronically Signed   By: Marcello Moores  Register   On: 11/04/2019 15:34   DG MYELOGRAPHY LUMBAR INJ MULTI REGION  Result Date: 10/22/2019 CLINICAL DATA:  Worsening spasticity. Spinal stenosis. Consideration of baclofen pump. FLUOROSCOPY TIME:  1.8 minutes.  95.7 mGy air kerma PROCEDURE: LUMBAR PUNCTURE FOR CERVICAL LUMBAR AND THORACIC MYELOGRAM CERVICAL AND LUMBAR AND THORACIC MYELOGRAM CT CERVICAL MYELOGRAM CT LUMBAR MYELOGRAM CT THORACIC MYELOGRAM Written and oral informed consent was obtained. Consent was  obtained by Dr. Monte Fantasia. Time-out was performed. Patient was positioned prone on the fluoroscopy table. Local anesthesia was provided with 1% lidocaine without epinephrine after prepped and draped in the usual sterile fashion. Puncture was performed at L5-S1 using a 3 1/2 inch 22-gauge spinal needle via midline approach. Using a single pass through the dura, the needle was placed within the thecal sac, with return of clear CSF. 10 mL Omnipaque 300 was injected into the thecal sac, with normal opacification of the nerve roots and cauda equina consistent with free flow within the subarachnoid space. Patient cannot stand or or easily positioned on the table due to spasms and joint flexion in the lower extremity. Contrast was advanced into the cervical region in the lateral decubitus position. I personally performed the lumbar puncture and administered the intrathecal contrast. I also personally supervised acquisition of the myelogram images. TECHNIQUE: Contiguous axial images were obtained through the Cervical, Thoracic, and Lumbar spine after the intrathecal infusion of infusion. Coronal and sagittal reconstructions were obtained of the axial image sets. FINDINGS: CERVICAL AND LUMBAR MYELOGRAM FINDINGS: Myelography was only used for positioning of intrathecal contrast, which flowed freely into the upper cervical spine. L3-4 severe disc space degeneration with vacuum phenomenon and disc space obliteration. There is severe stenosis at this level. CT CERVICAL MYELOGRAM FINDINGS: Alignment: Mild anterolisthesis C6-7 Vertebrae: C3-C6 ACDF with solid arthrodesis. Bridging osteophytes ankylosis the C6-7 and C7-T1 spaces. There is also ankylosis across the right facet and intervertebral space of C2-3. Cord: Suboptimal intrathecal contrast which has layered since myelography.  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 Extraspinal: No evidence of  inflammation or mass.  Thyroidectomy. Disc levels: C1-2: Advanced right-sided facet osteoarthritis with joint space narrowing and potential C2 impingement. C2-3: Ankylosis.  No visible impingement C3-4: ACDF. Patent canal. Residual ridging causes moderate left foraminal narrowing. C4-5: ACDF with solid arthrodesis. Patent spinal canal. The foramina appear sufficiently patent C5-6: ACDF. Uncovertebral and facet spurring causes moderate right foraminal narrowing. 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 C6-7: Facet spurring and anterolisthesis. Disc narrowing. Mild bilateral foraminal narrowing. Patent spinal canal C7-T1:Facet spurring and ankylosis. Bridging intervertebral ankylosis. CT THORACIC MYELOGRAM FINDINGS: Alignment: Exaggerated thoracic kyphosis. Vertebrae: No evidence of fracture or bone lesion. Cord: Suboptimal intrathecal contrast in the upper thoracic levels. No cord compression. Facet spurring in the lower thoracic levels mildly encroaches on the posterolateral cord. Extra-spinal: Left lower lobe and multi segment left upper lobe collapse with large pleural effusion. There is also right-sided atelectasis and volume loss. Cardiomegaly. Disc levels: Diffuse degenerative disc narrowing and ventral spurring. Bridging osteophytes are seen at multiple levels, including T6-7, T8-9, and T10-11. Facet spurs encroach on lower thoracic foramina without focal high-grade involvement CT LUMBAR MYELOGRAM FINDINGS: Segmentation: 5 lumbar type vertebrae. Alignment: Mild dextroscoliosis. Trace retrolisthesis at L2-3 and L3-4. Vertebrae: Extensive vertebral body sclerosis attributed to degenerative disease. No evidence of fracture or bone lesion. Extra-spinal: Extensive atherosclerotic plaque on the aorta. Conus: Tip terminates at L1-2.  No detected swelling Disc levels: T12- L1: Spondylosis.  No impingement L1-L2: Disc narrowing and bulging with endplate ridging.  Moderate spinal stenosis. L2-L3: Degenerative disc collapse with endplate sclerosis and posterior ridging. High-grade spinal stenosis. Moderate left more than right foraminal narrowing L3-L4: Severe disc degeneration with disc space obliteration and ridging. Bilateral facet spurring and ligamentum flavum thickening. Advanced spinal stenosis. Right more than left foraminal impingement L4-L5: Advanced disc narrowing with right-sided collapse and endplate ridging. Facet osteoarthritis with bulky spurring on the right where there is ankylosis. Left more than right subarticular recess narrowing. Biforaminal impingement. L5-S1:Disc narrowing and ridging. Right facet osteoarthritis. Moderate right foraminal narrowing. IMPRESSION: Lumbar spine: 1. 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 2. 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 3. L2-3 moderate to advanced spinal stenosis. 4. L1-2 moderate spinal stenosis. 5. Bilateral foraminal narrowings described above. Cervical spine: 1. Resolved cord compression after ACDF. There is solid arthrodesis from C3-C6 and degenerative ankylosis at C2-3, C6-7, and C7-T1. 2. Bilateral foraminal narrowings described above. 3. Large left pleural effusion with collapse of the majority of the left lung. Thoracic spine: 1. Diffuse degenerative disease without cord compression. 2. Multilevel spondylitic ankylosis. Electronically Signed   By: Monte Fantasia M.D.   On: 10/22/2019 09:43    Labs:  Basic Metabolic Panel: No results for input(s): NA, K, CL, CO2, GLUCOSE, BUN, CREATININE, CALCIUM, MG, PHOS in the last 168 hours.  CBC: No results for input(s): WBC, NEUTROABS, HGB, HCT, MCV, PLT in the last 168 hours.  CBG: No results for input(s): GLUCAP in the last 168 hours.  Family history.  Mother with COPD and hyperlipidemia as well as hypertension.  Father with diabetes and end-stage renal disease and hypertension.  Denies any esophageal cancer rectal cancer or colon  cancer  Brief HPI:   HOLLY MAUCH is a 79 y.o. 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 Brett Fairy of neurology services as well as neurosurgery, bowel bladder incontinence.  Per chart review lives with spouse 1 level home with ramped entrance.  Needed assistance for dressing as well as transfers.  He has a PCA who does provide some assistance.  He was able to ambulate short household distances with a walker.  Presented 09/17/2019 with generalized fatigue over the last couple of days and decrease in functional mobility.  Cranial CT scan was negative for acute changes.  Admission chemistries BUN 43 creatinine 1.64 WBC 13,000 troponin negative urinalysis negative nitrite urine culture 30,000 diphtheroids blood cultures no growth to date.  MRI thoracic lumbar cervical spine showed chronic changes with patchy cord signal abnormality at the C3-4 level consistent with chronic myelomalacia.  Multilevel degenerative disc disease of the lower thoracic spine without high-grade spinal stenosis as well as lumbar spondylosis and congenitally short pedicles causing prominent impingement at L2-3 and 3-4 with moderate impingement L1-2 and 4-5 as well as T12-L1 and L5-S1.  Impingement of L1-2 and 2-3 minimally worsened from prior exam 07/24/2017.  Neurosurgery Dr. Kristeen Miss consulted in regards to MRI findings patient not felt to be a surgical candidate advised IV Solu-Medrol then transition to prolonged prednisone with taper.  Echocardiogram with ejection fraction 60% no wall motion abnormalities.   Chronic pain control ongoing with the use of scheduled baclofen titrated to 15 mg 3 times daily as well as nightly Neurontin and OxyContin 10 mg every 12 hours.  Therapy evaluations completed and patient was admitted to inpatient rehab services 09/26/2019 with slow overall progress.  Ongoing care of patient spasticity with follow-up recommendations followed by neurology as well as neurosurgery considerations were even made for possible baclofen pump.  Follow-up cervical lumbar thoracic myelogram completed showing severe degenerative mild scoliosis and listhesis as well as L3-4 severe spinal stenosis L2-3 moderate to advanced spinal stenosis L1-2 moderate spinal stenosis.  Resolved cord compression after ACDF.  There was some bilateral foraminal narrowing noted.  Thoracic spine diffuse degenerative disease without cord compression.  Neurosurgery again discussed at length with family in regards to his weakness spasticity myelogram findings recommendations for L3-4 decompression.  During patient's rehab stay he did remain on Lovenox for DVT prophylaxis.  OSA with CPAP chronic oxygen monitored by pulmonary services.  Wound care consulted 10/23/2019 for left ankle ulcer as well as left buttocks with wound care as directed.  Left ankle films were completed showing no evidence of osteomyelitis fracture or other acute abnormality no visible abscess.  Patient was transferred to acute care services 10/28/2019 and underwent lumbar L3-4 laminectomy decompression 11/04/2019 per Dr. Venetia Constable.  Palliative care consulted to establish goals of care.  He was placed on subcutaneous heparin for DVT prophylaxis.  CPAP ongoing as well as oxygen needs for history of OSA chronic respiratory failure.  Wound care ongoing for left buttock sacral wound and left lateral ankle wound.  Therapy evaluations again completed and patient was readmitted to rehab services for comprehensive therapies   Hospital Course: [name] was admitted to rehab  11/10/2019 for inpatient therapies to consist of PT, ST and OT at least three hours five days a week. Past admission physiatrist, therapy team and rehab RN have worked together to provide customized collaborative inpatient rehab.  Pertaining to  patient's lumbar cervical stenosis radiculopathy spasticity as well as chronic left hemiparesis he had undergone L3-4 lumbar laminectomy decompression.  Surgical site healing nicely.  He was on subcutaneous heparin for DVT prophylaxis recent venous Doppler studies negative changed to Lovenox now that renal function stabilized as well as low-dose aspirin and he would continue his Lovenox at 40 mg daily through 12/17/2019 and stop.  Pain management spasticity with use of baclofen titrated as directed Zanaflex 3 times daily as well as Neurontin at nighttime with hydrocodone and Robaxin as needed.  Imitrex as needed for headaches consideration is made for Botox injections.  His Zanaflex was discontinued as spasticity did show some improvement as well is patient requesting discontinuation of medication.  Patient with history of chronic restless leg syndrome Sinemet as advised.  History of myoclonic jerking and had been on Keppra in the past and was discontinued as it was felt myoclonic jerking was more related to his spasticity.  Patient with history of OSA chronic respiratory failure CPAP as advised oxygen therapy and nebulizer treatment.  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  Hypothyroidism with Synthroid ongoing.  History of thoracic ascending aortic aneurysm of 4.2 cm with to be followed as outpatient.  Chronic diastolic congestive heart failure Lasix as directed monitor for any signs of fluid overload as well as Aldactone.  His blood pressure did remain somewhat soft his Toprol was reduced to 25 mg.  He continued on Lipitor for hyperlipidemia.  Bouts of gout Zyloprim as directed and did receive  indomethacin 50 mg twice daily with meals x10 doses.  Patient routine skin checks as well as stressed the need for routine turning to maintain pressure release close monitoring of sacral wound applied Santyl ointment to the left buttock sacral wound as well as left lateral ankle wound nickel thick layer covered with saline moistened gauze then sized appropriate foam dressing change daily as well as a Prevalon boot.   Blood pressures were monitored on TID basis and soft and monitored  . He/ has made gains during rehab stay and is attending therapies  He/will continue to receive follow up therapies   after discharge  Rehab course: During patient's stay in rehab weekly team conferences were held to monitor patient's progress, set goals and discuss barriers to discharge. At admission, patient required +2 physical assist supine to sit total assist sit to supine max assist lateral scoot transfers.  Moderate assist upper body bathing max is lower body bathing mod assist upper body dressing total assist lower body dressing  Physical exam.  Blood pressure 111/67 pulse 78 temperature 98.1 respirations 16 oxygen saturation 92% Neurologic.  Patient alert provides name age date of birth follows simple commands HEENT Head.  Normocephalic and atraumatic Eyes.  Pupils round and reactive to light no discharge.nystagmus Neck.  Supple nontender no JVD without thyromegaly Cardiac regular rate rhythm without any extra sounds or murmur heard Respiratory effort normal no respiratory distress without wheeze Abdomen.  Soft nontender positive bowel sounds without rebound Skin.  Sacral decubitus 10 cm at widest point stages 1, 2-3 and unstageable portion under eschar.  Left lateral ankle pressure injury with dressing in place Neurological.  Cranial nerves II through XII intact motor strength 4/5 in bilateral deltoid bicep tricep grip difficult to perform MMT in lower extremities had increased knee flexor tone limiting  range of motion.  Sensation normal sensation light touch lower extremities Musculoskeletal.  Bilateral hip flexor and  knee flexor contracture versus MAS 4 spasticity  He/  has had improvement in activity tolerance, balance, postural control as well as ability to compensate for deficits. He/ has had improvement in functional use RUE/LUE  and RLE/LLE as well as improvement in awareness.  Sessions focused on reviewing power wheelchair controls power wheelchair mobility patient is supervision level for wheelchair mobility 200 feet across level surface on hard flat ground.  Set up obstacle course for patient consisting of navigating through 4 chairs 3 feet apart.  Patient demonstrates good ability to maneuver through chairs decreased distance to 2 feet apart continues to exhibit good ability to maneuver a power wheelchair.  Patient is able to maneuver a power wheelchair through cones set 2 feet apart with minimal cues.  A sense descends ramp and power wheelchair at supervision level with moderate cues for setting up wheelchair and tilted and recline position.  Close monitoring of oxygen saturations during therapies.  Occupational Therapy intervention with focus on use of Hoyer lift for transfers.  Patient currently has Harrel Lemon lift and use this in the past.  Wife and caregiver ongoing with family teaching.  Neuropsychology did follow-up during rehabilitation stay for overall coping with a long hospital stay.  Full family teaching completed plan discharge to home       Disposition: Discharge to home    Diet: Regular  Special Instructions: No driving smoking or alcohol  Wound care.  Apply Santyl ointment to the left buttock sacral wound and left lateral ankle wound and a nickel thick layer.  Cover with saline moistened gauze then sized appropriate foam dressing to be changed daily  Ambulatory referral obtained with wound center for ongoing skin care  Routine changing of Foley catheter tube  Prevalon  boots as directed  Continue oxygen therapy with CPAP and nebulizer treatments as prior to admission.  Medications at discharge. 1.  Tylenol as needed 2.  Aspirin 81 mg p.o. daily 3.  Lipitor 20 mg p.o. daily 4.  Baclofen 30 mg 3 times daily and 40 mg nightly 5.  Calcium citrate 200 mg daily 6.  Sinemet 10-100 mg 1 tablet p.o. twice daily 7.  Colchicine 0.6 mg p.o. daily 8.  Colace 100 mg p.o. twice daily 9.  Breo Ellipta 200-25 1 puff daily 10.  Lasix 40 mg p.o. daily 11.  Neurontin 100 mg p.o. nightly 12.  Mucinex 1200 mg p.o. twice daily 13.  Hydrocodone 1 tablet every 3 hours as needed pain 4.Xalatan ophthalmic solution 0.05% 1 drop both eyes nightly 15.  Synthroid 125 mcg p.o. daily 16.  Robaxin-750 milligrams p.o. every 6 hours 17.  Toprol-XL 25 mg p.o. daily 18.  Multivitamin daily 19.  Nitroglycerin as needed 20.  Protonix 40 mg p.o. daily 21.  MiraLAX daily hold for loose stools 22.  Senokot S2 tablets p.o. twice daily 23.  Aldactone 25 mg p.o. daily 24.  Imitrex 50 mg every 2 hours as needed migraine headache 25.  Flomax 0.8 mg p.o. after supper 26.  Ellipta 62.5 mcg 1 puff p.o. daily 27.Lovenox 40 mg daily until 12/17/2019 and stop  45 minutes were spent completing discharge summary and discharge planning  Discharge Instructions    Ambulatory referral to Physical Medicine Rehab   Complete by: As directed    Moderate complexity follow-up 1 to 2 weeks incomplete paraplegia      Follow-up Information    Lovorn, Jinny Blossom, MD Follow up.   Specialty: Physical Medicine and Rehabilitation Why: Office to call for appointment  Contact information: [id number] [place] [address] [phone]        Judith Part, MD Follow up.   Specialty: Neurosurgery Why: Call for appointment Contact information: [place] [id number] [phone]        Dohmeier, Asencion Partridge, MD Follow up.   Specialty: Neurology Why: Call for  appointment Contact information: [address] [place] [address] [phone]           Signed: Cathlyn Parsons 11/20/2019, 4:59 AM

## 2019-11-20 ENCOUNTER — Inpatient Hospital Stay (HOSPITAL_COMMUNITY): Payer: Medicare Other | Admitting: Physical Therapy

## 2019-11-20 ENCOUNTER — Inpatient Hospital Stay (HOSPITAL_COMMUNITY): Payer: Medicare Other

## 2019-11-20 MED ORDER — BACLOFEN 20 MG PO TABS: 40.0000 mg | ORAL_TABLET | Freq: Every day | ORAL | 0 refills | Status: DC

## 2019-11-20 MED ORDER — ALBUTEROL SULFATE HFA 108 (90 BASE) MCG/ACT IN AERS: 2.0000 | INHALATION_SPRAY | Freq: Every day | RESPIRATORY_TRACT | Status: DC

## 2019-11-20 MED ORDER — ALBUTEROL SULFATE HFA 108 (90 BASE) MCG/ACT IN AERS: 1.0000 | INHALATION_SPRAY | RESPIRATORY_TRACT | 1 refills | Status: DC | PRN

## 2019-11-20 MED ORDER — METHOCARBAMOL 750 MG PO TABS: 750.0000 mg | ORAL_TABLET | Freq: Four times a day (QID) | ORAL | 0 refills | Status: DC | PRN

## 2019-11-20 MED ORDER — CALCIUM CITRATE 950 (200 CA) MG PO TABS: 200.0000 mg | ORAL_TABLET | Freq: Every day | ORAL | 0 refills | Status: DC

## 2019-11-20 MED ORDER — GUAIFENESIN ER 600 MG PO TB12: 1200.0000 mg | ORAL_TABLET | Freq: Two times a day (BID) | ORAL | 1 refills | Status: DC

## 2019-11-20 MED ORDER — HYDROCODONE-ACETAMINOPHEN 5-325 MG PO TABS: 1.0000 | ORAL_TABLET | ORAL | 0 refills | Status: DC | PRN

## 2019-11-20 MED ORDER — TAMSULOSIN HCL 0.4 MG PO CAPS: 0.8000 mg | ORAL_CAPSULE | Freq: Every day | ORAL | 1 refills | Status: DC

## 2019-11-20 MED ORDER — ATORVASTATIN CALCIUM 20 MG PO TABS: 20.0000 mg | ORAL_TABLET | Freq: Every day | ORAL | 0 refills | Status: DC

## 2019-11-20 MED ORDER — METOPROLOL SUCCINATE ER 25 MG PO TB24: 25.0000 mg | ORAL_TABLET | Freq: Every day | ORAL | 1 refills | Status: DC

## 2019-11-20 MED ORDER — PANTOPRAZOLE SODIUM 40 MG PO TBEC: 40.0000 mg | DELAYED_RELEASE_TABLET | Freq: Every evening | ORAL | 0 refills | Status: DC

## 2019-11-20 MED ORDER — CARBIDOPA-LEVODOPA 10-100 MG PO TABS: 1.0000 | ORAL_TABLET | Freq: Two times a day (BID) | ORAL | 0 refills | Status: DC

## 2019-11-20 MED ORDER — ENOXAPARIN SODIUM 40 MG/0.4ML ~~LOC~~ SOLN: SUBCUTANEOUS | 1 refills | Status: DC

## 2019-11-20 MED ORDER — BACLOFEN 10 MG PO TABS: 30.0000 mg | ORAL_TABLET | Freq: Three times a day (TID) | ORAL | 1 refills | Status: DC

## 2019-11-20 MED ORDER — GABAPENTIN 100 MG PO CAPS: 100.0000 mg | ORAL_CAPSULE | Freq: Every day | ORAL | 1 refills | Status: DC

## 2019-11-20 MED ORDER — COLCHICINE 0.6 MG PO TABS: 0.6000 mg | ORAL_TABLET | Freq: Every day | ORAL | 0 refills | Status: DC

## 2019-11-20 MED ORDER — SUMATRIPTAN SUCCINATE 50 MG PO TABS: 50.0000 mg | ORAL_TABLET | ORAL | 0 refills | Status: DC | PRN

## 2019-11-20 MED ORDER — LEVOTHYROXINE SODIUM 125 MCG PO TABS: 125.0000 ug | ORAL_TABLET | Freq: Every day | ORAL | 1 refills | Status: DC

## 2019-11-20 MED ORDER — FUROSEMIDE 40 MG PO TABS: 40.0000 mg | ORAL_TABLET | Freq: Every day | ORAL | 1 refills | Status: DC

## 2019-11-20 MED ORDER — IPRATROPIUM BROMIDE HFA 17 MCG/ACT IN AERS: 2.0000 | INHALATION_SPRAY | Freq: Every day | RESPIRATORY_TRACT | Status: DC

## 2019-11-20 MED ORDER — SPIRONOLACTONE 25 MG PO TABS: 25.0000 mg | ORAL_TABLET | Freq: Every day | ORAL | 1 refills | Status: DC

## 2019-11-20 MED ORDER — FLUTICASONE FUROATE-VILANTEROL 200-25 MCG/INH IN AEPB: 1.0000 | INHALATION_SPRAY | Freq: Every day | RESPIRATORY_TRACT | 1 refills | Status: DC

## 2019-11-20 MED ORDER — VITAMIN D3 25 MCG (1000 UNIT) PO TABS: 1000.0000 [IU] | ORAL_TABLET | Freq: Every day | ORAL | 0 refills | Status: DC

## 2019-11-20 NOTE — Progress Notes (Signed)
RT notified of pt wanting CPAP placed on. RT in rapid response, will get to pt's bedside as soon as possible.

## 2019-11-20 NOTE — Progress Notes (Signed)
Occupational Therapy Session Note  Patient Details  Name: DAMASCUS BLOT MRN: [id number] Date of Birth: March 03, 1941  Today's Date: 11/20/2019 OT Individual Time: 1300-1355 OT Individual Time Calculation (min): 55 min    Short Term Goals: Week 2:  OT Short Term Goal 1 (Week 2): STG=LTG secondary to ELOS  Skilled Therapeutic Interventions/Progress Updates:    Pt resting in PWC upon arrival eating lunch.  Pt agreeable to participating in therapy.  Pt commented that his shoulders were sore during the night following BUE exercise during the day.  Pt commented that he wanted to engaged in BUE therex again this afternoon.  Discussed discharge plans.  Emphasized breathing techiniques, BUE AAROM/AROM, and positioning in PWC/bed to prevent skin breakdown. Pt engaged in [place] therex with 2# bar and beach ball with focus on maintaining O2>90% on 2L O2. Pt completed 2 sets of 5, 2 sets of 10, and 4 sets of 10 punching beach ball back to therapist. Pt remained in [place] with all needs within reach.  Therapy Documentation Precautions:  Precautions Precautions: Fall, Back Precaution Booklet Issued: Yes (comment) Precaution Comments: L lateral ankle ulcer, L buttock/sacral ulcer Restrictions Weight Bearing Restrictions: No    Pain:  Pt c/o increased BLE pain with sporadic spasms; repositioned and emotional support   Therapy/Group: Individual Therapy  [name] 11/20/2019, 2:33 PM

## 2019-11-20 NOTE — Progress Notes (Signed)
Occupational Therapy Discharge Summary  Patient Details  Name: [name] MRN: [id number] Date of Birth: [birth year]  Patient has met 3 of 3 long term goals due to directing of care, family education and strenthening of extremities. Pt made steady progress with bed mobility and directing care during this admission.  Pt independent with directing care in preparation and during [facility] lift transfers.  Pt independently repositions in [place] and verbalizes repositioning schedule. Pt completes UB bathing/dressing tasks seated in PWC with setup assist. Pt dependent for LB bathing/dressing tasks at bed level.  Transfers with maxi move or Hoyer lift. Pt's wife and paid care giver present for therapy and actively participated in transfer.  Pt and wife had previously used [employer] fast streamer and are familiar with technique and operation. Patient to discharge at overall Supervision-total A level.  Patient's care partner is independent to provide the necessary physical assistance at discharge.      Recommendation:  Patient will benefit from ongoing skilled OT services in home health setting to continue to advance functional skills in the area of BADL and Reduce care partner burden.  Equipment: No equipment provided  Reasons for discharge: treatment goals met and discharge from hospital  Patient/family agrees with progress made and goals achieved: Yes  OT Discharge Vision Baseline Vision/History: Wears glasses Wears Glasses: Reading only Patient Visual Report: No change from baseline Vision Assessment?: No apparent visual deficits Perception  Perception: Within Functional Limits Praxis Praxis: Intact Cognition Overall Cognitive Status: Within Functional Limits for tasks assessed Arousal/Alertness: Awake/alert Orientation Level: Oriented X4 Attention: Focused;Sustained Focused Attention: Appears intact Sustained Attention: Appears intact Memory: Appears intact Awareness: Appears intact Problem Solving:  Appears intact Safety/Judgment: Appears intact Sensation Sensation Light Touch: Impaired Detail Peripheral sensation comments: reports R UE and LE sensation differen then L Light Touch Impaired Details: Impaired RLE;Impaired RUE Hot/Cold Impaired Details: Impaired RUE;Impaired LUE;Impaired RLE;Impaired LLE Stereognosis: Not tested Coordination Gross Motor Movements are Fluid and Coordinated: No Fine Motor Movements are Fluid and Coordinated: No Finger Nose Finger Test: WFL at slow rate Motor  Motor Motor: Abnormal tone;Abnormal postural alignment and control;Other (comment) Motor - Skilled Clinical Observations: ongoing BLE spasticity limiting functional mobility    Trunk/Postural Assessment  Cervical Assessment Cervical Assessment: (forward head) Thoracic Assessment Thoracic Assessment: (kyphotic) Lumbar Assessment Lumbar Assessment: (posterior pelvic tilt) Postural Control Righting Reactions: delayed, inadequate, and poor motor planning Protective Responses: delayed, inadequate, and poor motor planning Postural Limitations: decreased with R lateral lean with fatigue  Balance Balance Balance Assessed: Yes Static Sitting Balance Static Sitting - Balance Support: Feet supported;Bilateral upper extremity supported Dynamic Sitting Balance Dynamic Sitting - Level of Assistance: 4: Min assist Extremity/Trunk Assessment RUE Assessment RUE Assessment: Exceptions to [facility] Passive Range of Motion (PROM) Comments: WFL Active Range of Motion (AROM) Comments: shoulder flex/abd 90 LUE Assessment LUE Assessment: Exceptions to [facility] Passive Range of Motion (PROM) Comments: WFL Active Range of Motion (AROM) Comments: sh flex/abd [address]   [name] 11/20/2019, 6:32 AM

## 2019-11-20 NOTE — Progress Notes (Signed)
RN waited for wife to give lovenox shot  to patient for education. Per Linna Hoff PA patient will be going home with lovenox . Per patient he does not know if wife will be able to come today because she is waiting for the bed to arrive. Lovenox given.

## 2019-11-20 NOTE — Plan of Care (Signed)
  Problem: RH BLADDER ELIMINATION Goal: RH STG MANAGE BLADDER WITH ASSISTANCE Description: STG Manage Bladder With total Assistance Outcome: Not Progressing; patient to go home with foley

## 2019-11-20 NOTE — Progress Notes (Signed)
Physical Therapy Session Note  Patient Details  Name: [name] MRN: [id number] Date of Birth: 1940/08/20  Today's Date: 11/20/2019 PT Individual Time: 0910-1000 PT Individual Time Calculation (min): 50 min   Short Term Goals: Week 1:  PT Short Term Goal 1 (Week 1): =LTG due to ELOS  Skilled Therapeutic Interventions/Progress Updates:    Pt received seated in bed, agreeable to PT session. No complaints of pain at rest, does have onset of L hip and low back pain with mobility that decreases at rest. Rolling L/R with mod A for dependent donning of pants. Rolling L/R with mod A for placement of maxi move sling. Maxi move transfer bed to w/c. Pt left seated in power w/c in room with needs in reach at end of session. Pt on 2L O2 via nasal cannula throughout session, SpO2 drops to 89% when cannula removed during transfer and returns to 90% (+) once cannula placed back on pt.  Therapy Documentation Precautions:  Precautions Precautions: Fall, Back Precaution Booklet Issued: Yes (comment) Precaution Comments: L lateral ankle ulcer, L buttock/sacral ulcer Restrictions Weight Bearing Restrictions: No    Therapy/Group: Individual Therapy   Excell Seltzer, PT, DPT  11/20/2019, 2:23 PM

## 2019-11-20 NOTE — Progress Notes (Addendum)
Patient ID: [name], male   DOB: 05-30-1941, 79 y.o.   MRN: [id number]  SW followed up with Britney[facility], and still waiting on an answer. SW waiting on follow-up. *[facility] declined due to staffing.   SW sent referral Bobbi/Marketing with [facility] [phone]). SW waiting on follow-up.   Declined HHAs: [facility]- no nursing staff [facility]- declined due to limited nursing staff [facility]- due to staffing [facility]- due to care needs [facility]- declined as pt does not have out-of-network benefits  SW called pt wife [name] [phone]) to inform on HHA challenges. SW informed will follow-up once there is more information.   Loralee Pacas, MSW, [place] Office: [phone] Cell: [phone] Fax: [phone]

## 2019-11-20 NOTE — Progress Notes (Signed)
Physical Therapy Discharge Summary  Patient Details  Name: JARYD DREW MRN: [id number] Date of Birth: January 31, 1941  Today's Date: 11/20/2019   Patient has met 5 of 6 long term goals due to improved activity tolerance, improved balance, improved postural control and ability to compensate for deficits.  Patient to discharge at a wheelchair level with mod A for rolling, Total Assist for transfers via lift, and at mod I level for power w/c mobility.   Patient's care partner is independent to provide the necessary physical assistance at discharge. Pt's wife [name] and caregiver Maudie Mercury have completed hands on family education and are safe to assist pt upon d/c home.  Reasons goals not met: Pt did not meet bed mobility goal of rolling with min A as he still requires mod A. Pt's family and caregivers are able to provide this level of assist to the patient.  Recommendation:  Patient will benefit from ongoing skilled PT services in home health setting to continue to advance safe functional mobility, address ongoing impairments in endurance, strength, spasticity management, balance, safety, independence with functional mobility, and minimize fall risk.  Equipment: hospital bed, loaner power wheelchair from [employer]; pt already owns all other necessary equipment  Reasons for discharge: treatment goals met and discharge from hospital  Patient/family agrees with progress made and goals achieved: Yes  PT Discharge Precautions/Restrictions Precautions Precautions: Fall;Back Precaution Comments: reviewed precautions Restrictions Weight Bearing Restrictions: No Vision/Perception  Perception Perception: Within Functional Limits Praxis Praxis: Intact  Cognition Overall Cognitive Status: Within Functional Limits for tasks assessed Arousal/Alertness: Awake/alert Orientation Level: Oriented X4 Attention: Focused;Sustained Focused Attention: Appears intact Sustained Attention: Appears intact Memory:  Appears intact Awareness: Appears intact Problem Solving: Appears intact Safety/Judgment: Appears intact Sensation Sensation Light Touch: Impaired Detail Light Touch Impaired Details: Impaired RLE;Impaired RUE Proprioception: Impaired Detail Proprioception Impaired Details: Impaired RLE;Impaired LLE Coordination Gross Motor Movements are Fluid and Coordinated: No Fine Motor Movements are Fluid and Coordinated: No Coordination and Movement Description: impaired due to B LE paresis, impaired balance, and poor activity tolerance Motor  Motor Motor: Abnormal tone;Abnormal postural alignment and control;Other (comment) Motor - Skilled Clinical Observations: ongoing BLE spasticity limiting functional mobility Motor - Discharge Observations: impaired 2/2 incomplete paraplegia and LE muscle spasms  Mobility Bed Mobility Bed Mobility: Rolling Right;Rolling Left;Supine to Sit;Sit to Supine Rolling Right: Moderate Assistance - Patient 50-74% Rolling Left: Moderate Assistance - Patient 50-74% Supine to Sit: Maximal Assistance - Patient - Patient 25-49% Sit to Supine: Maximal Assistance - Patient 25-49% Transfers Transfers: Transfer via [place] to Stand: Dependent - mechanical lift Transfer via Lift Equipment: Maximove Locomotion  Gait Ambulation: No Gait Gait: No Stairs / Additional Locomotion Stairs: No Architect: Yes Wheelchair Assistance: Independent with Camera operator: Power Wheelchair Parts Management: Needs assistance Distance: 500  Trunk/Postural Assessment  Cervical Assessment Cervical Assessment: Exceptions to WFL(forward head) Thoracic Assessment Thoracic Assessment: (kyphotic) Lumbar Assessment Lumbar Assessment: Exceptions to WFL(posterior pelvic tilt) Postural Control Postural Control: Deficits on evaluation Righting Reactions: delayed, inadequate, and poor motor planning Protective Responses:  delayed, inadequate, and poor motor planning Postural Limitations: decreased with R lateral lean with fatigue  Balance Balance Balance Assessed: Yes Static Sitting Balance Static Sitting - Balance Support: Feet supported;Bilateral upper extremity supported Static Sitting - Level of Assistance: 2: Max assist Dynamic Sitting Balance Dynamic Sitting - Balance Support: No upper extremity supported;Feet supported;During functional activity Dynamic Sitting - Level of Assistance: 2: Max assist;1: +1 Total assist Extremity Assessment  RLE Assessment RLE Assessment: Exceptions to [facility] General Strength Comments: impaired, see below RLE Strength Right Hip Flexion: 3+/5 Right Knee Flexion: 2/5 Right Knee Extension: 2/5 Right Ankle Dorsiflexion: 2/5 Right Ankle Plantar Flexion: 2/5 LLE Assessment LLE Assessment: Exceptions to [facility] General Strength Comments: impaired, see below LLE Strength Left Hip Flexion: 4+/5 Left Knee Flexion: 2/5 Left Knee Extension: 3+/5 Left Ankle Dorsiflexion: 2/5 Left Ankle Plantar Flexion: 2/5     Excell Seltzer, PT, DPT 11/20/2019, 4:29 PM

## 2019-11-20 NOTE — Progress Notes (Signed)
Respiratory notified of MD wanting to use and teach about spacer for neb treatment. Respiratory therapist will bring and  teach patientnext  Neb treatment.

## 2019-11-20 NOTE — Progress Notes (Signed)
[facility] PHYSICAL MEDICINE & REHABILITATION PROGRESS NOTE   Subjective/Complaints:   Pt has never used spacer so asked nursing to show pt how to use today with inhalers.   Has received nebs this AM already.  Asking if going home with O2 and already has nebs machine at home- explained either needs to do 4 puffs with spacer OR nebs every AM at home to open up lungs.   Spasms are bad this AM in spite of receiving baclofen at 6am.  Not sure wife scheduled to come in today- waiting for the bed.   ROS:  Pt denies SOB, abd pain, CP, N/V/C/D, and vision changes   Objective:   No results found. No results for input(s): WBC, HGB, HCT, PLT in the last 72 hours. No results for input(s): NA, K, CL, CO2, GLUCOSE, BUN, CREATININE, CALCIUM in the last 72 hours.  Intake/Output Summary (Last 24 hours) at 11/20/2019 1015 Last data filed at 11/20/2019 0825 Gross per 24 hour  Intake 682 ml  Output 750 ml  Net -68 ml     Physical Exam: Vital Signs Blood pressure 114/64, pulse 73, temperature 98.1 F (36.7 C), temperature source Oral, resp. rate 18, height 5\' 7"  (1.702 m), weight 94.3 kg, SpO2 97 %.   Physical Exam  General: awake, sitting up slightly to R- off sacrum, eating breakfast, NAD Mood and affect are appropriate Heart: RRR Lungs: post nebs- sounds better- more air movement; still coarse Abdomen: Soft, NT, ND, (+)BS   Extremities: No clubbing, cyanosis, or edema Skin: L ankle pressure ulcer- no changes- slough over Superficial stage III ulcer Skin: Sacral decubitus irregular  ~10cm at widest point stagesand unstageable portion under eschar- but around unstageable is deep stage III L knee- dressing on L knee C/D/I   Neurologic: Cranial nerves II through XII intact, motor strength is 4/5 in bilateral deltoid, bicep, tricep, grip,  Spasms are very bad this AM-   Sensory exam normal sensation to light touch lower extremities Musculoskeletal: Bilateral hip flexor and knee flexor  contracture vs MAS 4 spasticity- hamstrings very tight    Assessment/Plan: 1. 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  Physiatrist is providing close team supervision and 24 hour management of active medical problems listed below.  Physiatrist and rehab team continue to assess barriers to discharge/monitor patient progress toward functional and medical goals  Care Tool:  Bathing    Body parts bathed by patient: Right arm, Left arm, Chest, Abdomen, Face   Body parts bathed by helper: Right arm, Left arm, Front perineal area, Buttocks, Right upper leg, Left upper leg, Right lower leg, Left lower leg Body parts n/a: Front perineal area, Buttocks, Right upper leg, Left upper leg, Right lower leg, Left lower leg   Bathing assist Assist Level: Moderate Assistance - Patient 50 - 74%     Upper Body Dressing/Undressing Upper body dressing   What is the patient wearing?: Pull over shirt    Upper body assist Assist Level: Minimal Assistance - Patient > 75%    Lower Body Dressing/Undressing Lower body dressing      What is the patient wearing?: Incontinence brief, Pants     Lower body assist Assist for lower body dressing: Dependent - Patient 0%     Toileting Toileting    Toileting assist Assist for toileting: Dependent - Patient 0%  Transfers Chair/bed transfer  Transfers assist     Chair/bed transfer assist level: Dependent - mechanical lift     Locomotion Ambulation   Ambulation assist   Ambulation activity did not occur: Safety/medical concerns          Walk 10 feet activity   Assist  Walk 10 feet activity did not occur: Safety/medical concerns        Walk 50 feet activity   Assist Walk 50 feet with 2 turns activity did not occur:  Safety/medical concerns         Walk 150 feet activity   Assist Walk 150 feet activity did not occur: Safety/medical concerns         Walk 10 feet on uneven surface  activity   Assist Walk 10 feet on uneven surfaces activity did not occur: Safety/medical concerns         Wheelchair     Assist Will patient use wheelchair at discharge?: Yes Type of Wheelchair: Power Wheelchair activity did not occur: Safety/medical concerns  Wheelchair assist level: Supervision/Verbal cueing Max wheelchair distance: 200'    Wheelchair 50 feet with 2 turns activity    Assist    Wheelchair 50 feet with 2 turns activity did not occur: Safety/medical concerns   Assist Level: Supervision/Verbal cueing   Wheelchair 150 feet activity     Assist  Wheelchair 150 feet activity did not occur: Safety/medical concerns   Assist Level: Supervision/Verbal cueing   Blood pressure 114/64, pulse 73, temperature 98.1 F (36.7 C), temperature source Oral, resp. rate 18, height 5\' 7"  (1.702 m), weight 94.3 kg, SpO2 97 %.  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 Status post L3-4 lumbar laminectomy decompression -patient may  shower -ELOS/Goals: one person assist ADL and mobility goals, tent d/c 6/4    5/28- W/C eval wil be Monday  6/1- will make sure pt turned every 2 hours or so when in bed  -pt wants to be DNR- changed order 2. Antithrombotics: -DVT/anticoagulation:Subcutaneous heparin. Recent venous Doppler studies negative.  5/25- changed to lovenox since renal fxn ok -antiplatelet therapy: Aspirin 81 mg daily 3. Pain Management:Baclofen 30 mg 3 times daily and 40 mg nightly, Zanaflex 2 mg 3 times daily, Neurontin 100 mg nightly, hydrocodone and Robaxin as needed. Imitrex as needed for headaches Would consider Botox  150-200 Units each hamstring  5/26- will try to get it done- either here or outpt.   5/27- will d/c zanaflex per pt request- says he knows it was for spasticity  5/28- spasticity slightly better off Zanaflex per pt  6/3- pt says not sure he wants to go to sleep for surgery again- not sure if wants ITB pump- will discuss further in oupt setting 4. Mood:Trazodone as needed -antipsychotic agents: N/A 5. Neuropsych: This patientiscapable of making decisions on hisown behalf. 6. Skin/Wound Care: deep stage III sacral wound- with unstageable eschar as well- Apply Santyl ointment to left buttock sacral wound and left lateral ankle wound in a nickel thick layer. Cover with saline moistened gauze then sized appropriate foam dressing changed daily, as discussed with PT/Wound care may benefit from hydrotherapy after 1-2 wks of colllagenase   5/28- L ankle looks slightly bette-r less slough and more superficial  6/1- truly needs low air loss mattress at home due to wound and to be turned frequently.   6/3- being delivered today 7. Fluids/Electrolytes/Nutrition:Routine in and outs with follow-up chemistries 8. Chronic  restless leg syndrome. 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   5/28- better affect/mood- per pt 10. OSA/chronic respiratory failure. Continue CPAP and oxygen as directed with nebulizer treatments  5/25- add duonebs at 8am in morning  5/31- will add Albuterol inhalers prn- with spacer  6/1- will make 2-4 puffs q3 hours prn  6/3- needs to use nebs OR 4 puffs with spacer every day- nursing to show how to use spacer today 11. Neurogenic bowel bladder. Flomax 0.8 mg daily. Check PVR. 12. Hypothyroidism. Synthroid 13. 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 Follow-up outpatient 14. Chronic diastolic  congestive heart failure. Monitor for any signs of fluid overload. Lasix 40 mg daily. Continue Aldactone 25 mg daily Vitals:   11/19/19 2028 11/20/19 0748  BP: 114/64   Pulse: 77 73  Resp: 18 18  Temp: 98.1 F (36.7 C)   SpO2: 93% 97%   15. Hypertension. Toprol-XL 50 mg daily.  5/26- BP well controlled- con't meds/regimen, will reduce toprol to 25mg  , HR and BP in range cont. To monitor   6/2- BP well controlled- con't meds 16. Hyperlipidemia. Lipitor 17. Gout. Zyloprim 300 mg daily.  5/25- change to colchicine since acute gout Sx' and add Indomethacin 50 mg BID with meals for pain x 10 doses  5/27- gout Sx's better- con't regimen  6/1- no complaints anymore 18. Earwax  5/27- ordered debrox B/L ears- x 5 days  5/28= pt reports no better so far- had 1 dose 19. Nasal stuffiness  5/28- ordered Afrin BID PRN- since used TOO much at home, and didn't respond to flonase at all-  20. Dispo  6/2- d/c Friday  6/3- tomorrow- need wife to learn Lovenox- let nurse know- ambulance set up for noon    LOS: 10 days A FACE TO FACE EVALUATION WAS PERFORMED  Atziry Baranski 11/20/2019, 10:15 AM

## 2019-11-21 MED ORDER — BLOOD GLUCOSE METER KIT: PACK | 0 refills | Status: DC

## 2019-11-21 MED ORDER — ENOXAPARIN SODIUM 40 MG/0.4ML ~~LOC~~ SOLN
40.0000 mg | Freq: Every day | SUBCUTANEOUS | Status: DC
  Administered 2019-11-22 – 2019-11-24 (×3): 40 mg via SUBCUTANEOUS
  Filled 2019-11-21 (×4): qty 0.4

## 2019-11-21 MED ORDER — ENOXAPARIN (LOVENOX) PATIENT EDUCATION KIT
PACK | Freq: Once | Status: AC
  Filled 2019-11-21: qty 1

## 2019-11-21 NOTE — Progress Notes (Signed)
[place] PHYSICAL MEDICINE & REHABILITATION PROGRESS NOTE   Subjective/Complaints:   Pt's wife on phone said bed was NOT delivered- nor was temporary w/c (that was supposed to be delivered today when got home).  Pt's wife is scared to have pt go home without H/H as backup, esp with wound on sacrum that's significant.   Pt reports doesn't need spacer- doing well with inhalers, however wife needs to learn to do BGs and needs meter, etc,needs to learn lovenox, and foley needs to be changed prior to d/c.    ROS:  Pt denies SOB, abd pain, CP, N/V/C/D, and vision changes    Objective:   No results found. No results for input(s): WBC, HGB, HCT, PLT in the last 72 hours. No results for input(s): NA, K, CL, CO2, GLUCOSE, BUN, CREATININE, CALCIUM in the last 72 hours.  Intake/Output Summary (Last 24 hours) at 11/21/2019 1128 Last data filed at 11/21/2019 0900 Gross per 24 hour  Intake 1000 ml  Output 1500 ml  Net -500 ml     Physical Exam: Vital Signs Blood pressure 117/80, pulse 75, temperature 98 F (36.7 C), temperature source Oral, resp. rate 19, height 5\' 7"  (1.702 m), weight 98.9 kg, SpO2 94 %.   Physical Exam  General: awake, laying on R side somewhat- just woke up, talked to pt/wife with wife on phone/speaker, NAD Mood and affect are appropriate- sad cannot d/c home today Heart: RRR Lungs: pre-nebs- the best he's sounded since admission prior to nebs- still coarse, but much better air movement Abdomen: Soft, NT, ND, (+)BS    Extremities: No clubbing, cyanosis, or edema Skin: L ankle pressure ulcer- no changes- slough over Superficial stage III ulcer Skin: Sacral decubitus irregular  ~10cm at widest point stagesand unstageable portion under eschar- but around unstageable is deep stage III L knee- dressing on L knee C/D/I Neurologic: Cranial nerves II through XII intact, motor strength is 4/5 in bilateral deltoid, bicep, tricep, grip,  Spasms slightly better this AM-  not great Sensory exam normal sensation to light touch lower extremities Musculoskeletal: Bilateral hip flexor and knee flexor contracture vs MAS 4 spasticity- hamstrings very tight    Assessment/Plan: 1. 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  Physiatrist is providing close team supervision and 24 hour management of active medical problems listed below.  Physiatrist and rehab team continue to assess barriers to discharge/monitor patient progress toward functional and medical goals  Care Tool:  Bathing    Body parts bathed by patient: Right arm, Left arm, Chest, Abdomen, Right upper leg, Left upper leg, Face   Body parts bathed by helper: Front perineal area, Buttocks, Right lower leg, Left lower leg Body parts n/a: Front perineal area, Buttocks, Right upper leg, Left upper leg, Right lower leg, Left lower leg   Bathing assist Assist Level: Moderate Assistance - Patient 50 - 74%     Upper Body Dressing/Undressing Upper body dressing   What is the patient wearing?: Pull over shirt    Upper body assist Assist Level: Contact Guard/Touching assist    Lower Body Dressing/Undressing Lower body dressing      What is the patient wearing?: Incontinence brief, Pants     Lower body assist Assist for lower body dressing: Dependent - Patient 0%     Toileting Toileting    Toileting assist Assist for  toileting: Dependent - Patient 0%     Transfers Chair/bed transfer  Transfers assist     Chair/bed transfer assist level: Dependent - mechanical lift     Locomotion Ambulation   Ambulation assist   Ambulation activity did not occur: Safety/medical concerns          Walk 10 feet activity   Assist  Walk 10 feet activity did not occur: Safety/medical  concerns        Walk 50 feet activity   Assist Walk 50 feet with 2 turns activity did not occur: Safety/medical concerns         Walk 150 feet activity   Assist Walk 150 feet activity did not occur: Safety/medical concerns         Walk 10 feet on uneven surface  activity   Assist Walk 10 feet on uneven surfaces activity did not occur: Safety/medical concerns         Wheelchair     Assist Will patient use wheelchair at discharge?: Yes Type of Wheelchair: Power Wheelchair activity did not occur: Safety/medical concerns  Wheelchair assist level: Independent Max wheelchair distance: 500'    Wheelchair 50 feet with 2 turns activity    Assist    Wheelchair 50 feet with 2 turns activity did not occur: Safety/medical concerns   Assist Level: Independent   Wheelchair 150 feet activity     Assist  Wheelchair 150 feet activity did not occur: Safety/medical concerns   Assist Level: Independent   Blood pressure 117/80, pulse 75, temperature 98 F (36.7 C), temperature source Oral, resp. rate 19, height 5\' 7"  (1.702 m), weight 98.9 kg, SpO2 94 %.  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 Status post L3-4 lumbar laminectomy decompression -patient may  shower -ELOS/Goals: one person assist ADL and mobility goals, tent d/c 6/4    5/28- W/C eval wil be Monday  6/1- will make sure pt turned every 2 hours or so when in bed  6/4- pt was to d/c today- family needs more family ed AND no H/H or bed at home- explained cannot d/c until set up  -pt wants to be DNR- changed order 2. Antithrombotics: -DVT/anticoagulation:Subcutaneous heparin. Recent venous Doppler studies negative.  5/25- changed to lovenox since renal fxn ok -antiplatelet therapy: Aspirin 81 mg daily 3. Pain Management:Baclofen 30 mg 3  times daily and 40 mg nightly, Zanaflex 2 mg 3 times daily, Neurontin 100 mg nightly, hydrocodone and Robaxin as needed. Imitrex as needed for headaches Would consider Botox 150-200 Units each hamstring  5/26- will try to get it done- either here or outpt.   5/27- will d/c zanaflex per pt request- says he knows it was for spasticity  5/28- spasticity slightly better off Zanaflex per pt  6/3- pt says not sure he wants to go to sleep for surgery again- not sure if wants ITB pump- will discuss further in oupt setting 4. Mood:Trazodone as needed -antipsychotic agents: N/A 5. Neuropsych: This patientiscapable of making decisions on hisown behalf. 6. Skin/Wound Care: deep stage III sacral wound- with unstageable eschar as well- Apply Santyl ointment to left buttock sacral wound and left lateral ankle wound in a nickel thick layer. Cover with saline moistened gauze then sized appropriate foam dressing changed daily, as discussed with PT/Wound care may benefit from hydrotherapy after 1-2 wks of colllagenase   5/28- L ankle looks slightly bette-r less slough and more superficial  6/1- truly  needs low air loss mattress at home due to wound and to be turned frequently.   6/4- bed not delivered for home- spoke with SW to address 7. Fluids/Electrolytes/Nutrition:Routine in and outs with follow-up chemistries 8. Chronic restless leg syndrome. 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   5/28- better affect/mood- per pt 10. OSA/chronic respiratory failure. Continue CPAP and oxygen as directed with nebulizer treatments  5/25- add duonebs at 8am in morning  5/31- will add Albuterol inhalers prn- with spacer  6/1- will make 2-4 puffs q3 hours prn  6/3- needs to use nebs OR 4 puffs with spacer every day- nursing to show how to use  spacer today 11. Neurogenic bowel bladder. Flomax 0.8 mg daily. Check PVR. 12. Hypothyroidism. Synthroid 13. 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 Follow-up outpatient 14. Chronic diastolic congestive heart failure. Monitor for any signs of fluid overload. Lasix 40 mg daily. Continue Aldactone 25 mg daily Vitals:   11/21/19 0318 11/21/19 0759  BP: 117/80   Pulse: 75   Resp: 19   Temp: 98 F (36.7 C)   SpO2: 95% 94%   15. Hypertension. Toprol-XL 50 mg daily.  5/26- BP well controlled- con't meds/regimen, will reduce toprol to 25mg  , HR and BP in range cont. To monitor   6/2- BP well controlled- con't meds 16. Hyperlipidemia. Lipitor 17. Gout. Zyloprim 300 mg daily.  5/25- change to colchicine since acute gout Sx' and add Indomethacin 50 mg BID with meals for pain x 10 doses  5/27- gout Sx's better- con't regimen  6/1- no complaints anymore 18. Earwax  5/27- ordered debrox B/L ears- x 5 days  5/28= pt reports no better so far- had 1 dose 19. Nasal stuffiness  5/28- ordered Afrin BID PRN- since used TOO much at home, and didn't respond to flonase at all-  20. DM  6/4- pt said today was OK with BGs checks- however prior said was NOT-on BMP BGs look OK, but will recheck BMPs/CBC Monday  21. Dispo  6/2- d/c Friday  6/3- tomorrow- need wife to learn Lovenox- let nurse know- ambulance set up for noon  6/4- will change lovenox til noon so family can get in here early enough to learn to do- also, hold d/c until next week.     LOS: 11 days A FACE TO FACE EVALUATION WAS PERFORMED  Ashaunti Treptow 11/21/2019, 11:28 AM

## 2019-11-21 NOTE — Progress Notes (Signed)
Patient ID: [name], male   DOB: [birth year], 79 y.o.   MRN: [id number]  SW received message from [facility] unable to accept referral due to staffing.   Per medical team, pt will not d/c due to issues with DME delivery and unable to obtain [facility].  SW will continue to make efforts to secure [facility] for patient.   Loralee Pacas, MSW, [place] Office: [phone] Cell: [phone] Fax: [phone]

## 2019-11-22 DIAGNOSIS — M48061 Spinal stenosis, lumbar region without neurogenic claudication: Secondary | ICD-10-CM

## 2019-11-22 DIAGNOSIS — I10 Essential (primary) hypertension: Secondary | ICD-10-CM

## 2019-11-22 DIAGNOSIS — I5032 Chronic diastolic (congestive) heart failure: Secondary | ICD-10-CM

## 2019-11-22 DIAGNOSIS — E039 Hypothyroidism, unspecified: Secondary | ICD-10-CM

## 2019-11-22 MED ORDER — DIAZEPAM 2 MG PO TABS
1.0000 mg | ORAL_TABLET | Freq: Every day | ORAL | Status: DC
  Administered 2019-11-22: 1 mg via ORAL
  Filled 2019-11-22: qty 1

## 2019-11-22 NOTE — Progress Notes (Signed)
[place] PHYSICAL MEDICINE & REHABILITATION PROGRESS NOTE   Subjective/Complaints: Patient seen laying in bed this morning.  He states he did not sleep well overnight due to spasms, however, he is wary of medication changes.  ROS: Denies CP, SOB, N/V/D  Objective:   No results found. No results for input(s): WBC, HGB, HCT, PLT in the last 72 hours. No results for input(s): NA, K, CL, CO2, GLUCOSE, BUN, CREATININE, CALCIUM in the last 72 hours.  Intake/Output Summary (Last 24 hours) at 11/22/2019 1300 Last data filed at 11/22/2019 0851 Gross per 24 hour  Intake 476 ml  Output 2500 ml  Net -2024 ml     Physical Exam: Vital Signs Blood pressure 114/81, pulse 85, temperature 98 F (36.7 C), resp. rate 18, height 5\' 7"  (1.702 m), weight 97.5 kg, SpO2 98 %. Constitutional: No distress . Vital signs reviewed. HENT: Normocephalic.  Atraumatic. Eyes: EOMI. No discharge. Cardiovascular: No JVD. Respiratory: Normal effort.  No stridor. GI: Non-distended. Skin: Warm and dry.  Intact.  On visible skin Psych: Normal mood.  Normal behavior. Musc: No edema in extremities.  No tenderness in extremities. Neurologic: Alert Bilateral upper extremities: 4/5 Right lower extremity: 0/5, limited by tone Left lower extremity: Hip flexion, knee extension 2+/5, ankle dorsiflexion 0/5, limited by tone  Assessment/Plan: 1. 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  Physiatrist is providing close team supervision and 24 hour management of active medical problems listed below.  Physiatrist and rehab team continue to assess barriers to discharge/monitor patient progress toward functional and medical goals  Care Tool:  Bathing    Body parts bathed by patient: Right arm, Left arm, Chest,  Abdomen, Right upper leg, Left upper leg, Face   Body parts bathed by helper: Front perineal area, Buttocks, Right lower leg, Left lower leg Body parts n/a: Front perineal area, Buttocks, Right upper leg, Left upper leg, Right lower leg, Left lower leg   Bathing assist Assist Level: Moderate Assistance - Patient 50 - 74%     Upper Body Dressing/Undressing Upper body dressing   What is the patient wearing?: Pull over shirt    Upper body assist Assist Level: Contact Guard/Touching assist    Lower Body Dressing/Undressing Lower body dressing      What is the patient wearing?: Incontinence brief, Pants     Lower body assist Assist for lower body dressing: Dependent - Patient 0%     Toileting Toileting    Toileting assist Assist for toileting: Dependent - Patient 0%     Transfers Chair/bed transfer  Transfers assist     Chair/bed transfer assist level: Dependent - mechanical lift     Locomotion Ambulation   Ambulation assist   Ambulation activity did not occur: Safety/medical concerns          Walk 10 feet activity   Assist  Walk 10 feet activity did not occur: Safety/medical concerns        Walk 50 feet activity   Assist Walk 50 feet with 2 turns activity did not occur: Safety/medical concerns         Walk 150 feet activity   Assist Walk 150 feet activity did not occur: Safety/medical concerns         Walk 10 feet on uneven surface  activity   Assist Walk 10 feet on uneven surfaces activity did  not occur: Safety/medical concerns         Wheelchair     Assist Will patient use wheelchair at discharge?: Yes Type of Wheelchair: Power Wheelchair activity did not occur: Safety/medical concerns  Wheelchair assist level: Independent Max wheelchair distance: 500'    Wheelchair 50 feet with 2 turns activity    Assist    Wheelchair 50 feet with 2 turns activity did not occur: Safety/medical concerns   Assist Level:  Independent   Wheelchair 150 feet activity     Assist  Wheelchair 150 feet activity did not occur: Safety/medical concerns   Assist Level: Independent   Blood pressure 114/81, pulse 85, temperature 98 F (36.7 C), resp. rate 18, height 5\' 7"  (1.702 m), weight 97.5 kg, SpO2 98 %.  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 Status post L3-4 lumbar laminectomy decompression  Patient was to be discharged yesterday however family teaching and the main complete 2. Antithrombotics:  -DVT/anticoagulation:Subcutaneous Lovenox. Recent venous Doppler studies negative. -antiplatelet therapy: Aspirin 81 mg daily 3. Pain Management:Baclofen 30 mg 3 times daily and 40 mg nightly, Neurontin 100 mg nightly, hydrocodone and Robaxin as needed. Imitrex as needed for headaches  Would consider Botox 150-200 Units each hamstring  D/ced zanaflex per pt request- says he knows it was for spasticity  Patient not sure he wants to go to sleep for surgery again- not sure if wants ITB pump  Valium 1 mg.  Just started on 6/5 4. Mood:Trazodone as needed -antipsychotic agents: N/A 5. Neuropsych: This patientiscapable of making decisions on hisown behalf. 6. Skin/Wound Care: unstageable sacral ulcer  Apply Santyl ointment to left buttock sacral wound and left lateral ankle wound in a nickel thick layer. Cover with saline moistened gauze then sized appropriate foam dressing changed daily, as discussed with PT/Wound care may benefit from hydrotherapy after 1-2 wks of colllagenase   Low air loss mattress at home due to wound and to be turned frequently.  7. Fluids/Electrolytes/Nutrition:Routine in and outs with follow-up chemistries 8. Chronic restless leg syndrome. Sinemet 10-100 mg twice daily 9. Myoclonic jerking.    Keppra DC'd  Improving  10. OSA/chronic  respiratory failure. Continue CPAP and oxygen as directed with nebulizer treatments  Duonebs at 8am in morning  Albuterol inhalers prn- with spacer 11. Neurogenic bowel bladder. Flomax 0.8 mg daily.  12. Hypothyroidism.  Continue Synthroid 13. 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 Follow-up outpatient 14. Chronic diastolic congestive heart failure. Monitor for any signs of fluid overload. Lasix 40 mg daily. Continue Aldactone 25 mg daily  Filed Weights   11/20/19 0720 11/21/19 0556 11/22/19 0500  Weight: 94.3 kg 98.9 kg 97.5 kg   15. Hypertension.   Toprol decreased to 25mg    Controlled on 6/5 16. Hyperlipidemia. Lipitor 17. Gout. Zyloprim 300 mg daily changed to colchicine.  Indomethacin 50 mg BID course completed 18. Earwax: Improved 19. Nasal stuffiness: Improved 20. DM  6/4- pt said today was OK with CBGs checks- however prior said was NOT-on BMP BGs look OK, but will recheck BMPs/CBC Monday 21.  Leukocytosis  WBCs 10.9 on 5/25, labs ordered for Monday  Afebrile  LOS: 12 days A FACE TO FACE EVALUATION WAS PERFORMED  Drury Ardizzone Lorie Phenix 11/22/2019, 1:00 PM

## 2019-11-22 NOTE — Progress Notes (Signed)
Pt has order for Lovenox teaching due to going home with medication.  Pt deferred teaching at the moment due to wife not being present. Pt would like wife to also be here, since she'll be administering it.  RN broached idea of pt still receiving education, but pt refused.

## 2019-11-23 DIAGNOSIS — M62838 Other muscle spasm: Secondary | ICD-10-CM

## 2019-11-23 DIAGNOSIS — D72829 Elevated white blood cell count, unspecified: Secondary | ICD-10-CM

## 2019-11-23 NOTE — Progress Notes (Signed)
[place] PHYSICAL MEDICINE & REHABILITATION PROGRESS NOTE   Subjective/Complaints: Patient seen laying in bed this morning.  He states he did not sleep well overnight because he cannot fall asleep until 4 AM.  He cannot identify reason why.    ROS: + Lower extremity muscle spasms.  Denies CP, SOB, N/V/D  Objective:   No results found. No results for input(s): WBC, HGB, HCT, PLT in the last 72 hours. No results for input(s): NA, K, CL, CO2, GLUCOSE, BUN, CREATININE, CALCIUM in the last 72 hours.  Intake/Output Summary (Last 24 hours) at 11/23/2019 0844 Last data filed at 11/23/2019 0455 Gross per 24 hour  Intake 940 ml  Output 2000 ml  Net -1060 ml     Physical Exam: Vital Signs Blood pressure 94/63, pulse 70, temperature 97.8 F (36.6 C), temperature source Oral, resp. rate 18, height 5\' 7"  (1.702 m), weight 98 kg, SpO2 97 %. Constitutional: No distress . Vital signs reviewed. HENT: Normocephalic.  Atraumatic. Eyes: EOMI. No discharge. Cardiovascular: No JVD. Respiratory: Normal effort.  No stridor. GI: Non-distended. Skin: Warm and dry.  Intact on visible skin. Psych: Normal mood.  Normal behavior. Musc: No edema in extremities.  No tenderness in extremities. Neuro: Alert Right lower extremity: 0/5, limited by tone and guarding, unchanged Left lower extremity: Hip flexion, knee extension 2+/5, ankle dorsiflexion 0/5, limited by tone and guarding, unchanged  Assessment/Plan: 1. 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  Physiatrist is providing close team supervision and 24 hour management of active medical problems listed below.  Physiatrist and rehab team continue to assess barriers to discharge/monitor patient progress toward functional and medical  goals  Care Tool:  Bathing    Body parts bathed by patient: Right arm, Left arm, Chest, Abdomen, Right upper leg, Left upper leg, Face   Body parts bathed by helper: Front perineal area, Buttocks, Right lower leg, Left lower leg Body parts n/a: Front perineal area, Buttocks, Right upper leg, Left upper leg, Right lower leg, Left lower leg   Bathing assist Assist Level: Moderate Assistance - Patient 50 - 74%     Upper Body Dressing/Undressing Upper body dressing   What is the patient wearing?: Pull over shirt    Upper body assist Assist Level: Contact Guard/Touching assist    Lower Body Dressing/Undressing Lower body dressing      What is the patient wearing?: Incontinence brief, Pants     Lower body assist Assist for lower body dressing: Dependent - Patient 0%     Toileting Toileting    Toileting assist Assist for toileting: Dependent - Patient 0%     Transfers Chair/bed transfer  Transfers assist     Chair/bed transfer assist level: Dependent - mechanical lift     Locomotion Ambulation   Ambulation assist   Ambulation activity did not occur: Safety/medical concerns          Walk 10 feet activity   Assist  Walk 10 feet activity did not occur: Safety/medical concerns        Walk 50 feet activity   Assist Walk 50 feet with 2 turns activity did not occur: Safety/medical concerns         Walk 150 feet activity   Assist Walk 150 feet activity did not occur: Safety/medical concerns         Walk 10 feet  on uneven surface  activity   Assist Walk 10 feet on uneven surfaces activity did not occur: Safety/medical concerns         Wheelchair     Assist Will patient use wheelchair at discharge?: Yes Type of Wheelchair: Power Wheelchair activity did not occur: Safety/medical concerns  Wheelchair assist level: Independent Max wheelchair distance: 500'    Wheelchair 50 feet with 2 turns activity    Assist    Wheelchair  50 feet with 2 turns activity did not occur: Safety/medical concerns   Assist Level: Independent   Wheelchair 150 feet activity     Assist  Wheelchair 150 feet activity did not occur: Safety/medical concerns   Assist Level: Independent   Blood pressure 94/63, pulse 70, temperature 97.8 F (36.6 C), temperature source Oral, resp. rate 18, height 5\' 7"  (1.702 m), weight 98 kg, SpO2 97 %.  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 Status post L3-4 lumbar laminectomy decompression  Patient was to be discharged on 6/4 however family teaching and DME incomplete 2. Antithrombotics:  -DVT/anticoagulation:Subcutaneous Lovenox. Recent venous Doppler studies negative. -antiplatelet therapy: Aspirin 81 mg daily 3. Pain Management:Baclofen 30 mg 3 times daily and 40 mg nightly, Neurontin 100 mg nightly, hydrocodone and Robaxin as needed. Imitrex as needed for headaches  Would consider Botox 150-200 Units each hamstring  D/ced zanaflex per pt request- says he knows it was for spasticity  Patient not sure he wants to go to sleep for surgery again- not sure if wants ITB pump  Valium 1 mg DC'd on 6/5, as patient states this made spasms worse 4. Mood:Trazodone as needed -antipsychotic agents: N/A 5. Neuropsych: This patientiscapable of making decisions on hisown behalf. 6. Skin/Wound Care: unstageable sacral ulcer  Apply Santyl ointment to left buttock sacral wound and left lateral ankle wound in a nickel thick layer. Cover with saline moistened gauze then sized appropriate foam dressing changed daily, as discussed with PT/Wound care may benefit from hydrotherapy after 1-2 wks of colllagenase   Low air loss mattress at home due to wound and to be turned frequently.  7. Fluids/Electrolytes/Nutrition:Routine in and outs with follow-up  chemistries 8. Chronic restless leg syndrome. Sinemet 10-100 mg twice daily 9. Myoclonic jerking.    Keppra DC'd  Improving  10. OSA/chronic respiratory failure. Continue CPAP and oxygen as directed with nebulizer treatments  Duonebs at 8am in morning  Albuterol inhalers prn- with spacer 11. Neurogenic bowel bladder.  Flomax 0.8 mg daily.  12. Hypothyroidism.  Continue Synthroid 13. 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 Follow-up outpatient 14. Chronic diastolic congestive heart failure. Monitor for any signs of fluid overload. Lasix 40 mg daily. Continue Aldactone 25 mg daily  Filed Weights   11/21/19 0556 11/22/19 0500 11/23/19 0435  Weight: 98.9 kg 97.5 kg 98 kg   Stable on 6/6 15. Hypertension.   Toprol decreased to 25mg    Labile on 6/6, monitor for hypotension 16. Hyperlipidemia. Lipitor 17. Gout. Zyloprim 300 mg daily changed to colchicine.  Indomethacin 50 mg BID course completed 18. Earwax: Improved 19. Nasal stuffiness: Improved 20. DM  6/4- pt said today was OK with CBGs checks- however prior said was NOT-on BMP BGs look OK, but will recheck BMPs/CBC tomorrow 21.  Leukocytosis  WBCs 10.9 on 5/25, labs ordered for tomorrow  Afebrile  LOS: 13 days A FACE TO FACE EVALUATION WAS PERFORMED  Kaspar Albornoz Lorie Phenix 11/23/2019, 8:44 AM

## 2019-11-24 LAB — COMPREHENSIVE METABOLIC PANEL
ALT: 15 U/L (ref 0–44)
AST: 17 U/L (ref 15–41)
Albumin: 3.4 g/dL — ABNORMAL LOW (ref 3.5–5.0)
Alkaline Phosphatase: 77 U/L (ref 38–126)
Anion gap: 13 (ref 5–15)
BUN: 23 mg/dL (ref 8–23)
CO2: 28 mmol/L (ref 22–32)
Calcium: 9.2 mg/dL (ref 8.9–10.3)
Chloride: 98 mmol/L (ref 98–111)
Creatinine, Ser: 0.96 mg/dL (ref 0.61–1.24)
GFR calc Af Amer: >60 mL/min (ref >60)
GFR calc non Af Amer: >60 mL/min (ref >60)
Glucose, Bld: 101 mg/dL — ABNORMAL HIGH (ref 70–99)
Potassium: 3.8 mmol/L (ref 3.5–5.1)
Sodium: 139 mmol/L (ref 135–145)
Total Bilirubin: 0.7 mg/dL (ref 0.3–1.2)
Total Protein: 6.6 g/dL (ref 6.5–8.1)

## 2019-11-24 LAB — CBC WITH DIFFERENTIAL/PLATELET
Abs Immature Granulocytes: 0.02 10*3/uL (ref 0.00–0.07)
Basophils Absolute: 0.1 10*3/uL (ref 0.0–0.1)
Basophils Relative: 1 %
Eosinophils Absolute: 0.6 10*3/uL — ABNORMAL HIGH (ref 0.0–0.5)
Eosinophils Relative: 7 %
HCT: 38.2 % — ABNORMAL LOW (ref 39.0–52.0)
Hemoglobin: 12.1 g/dL — ABNORMAL LOW (ref 13.0–17.0)
Immature Granulocytes: 0 %
Lymphocytes Relative: 19 %
Lymphs Abs: 1.6 10*3/uL (ref 0.7–4.0)
MCH: 28.3 pg (ref 26.0–34.0)
MCHC: 31.7 g/dL (ref 30.0–36.0)
MCV: 89.5 fL (ref 80.0–100.0)
Monocytes Absolute: 0.9 10*3/uL (ref 0.1–1.0)
Monocytes Relative: 10 %
Neutro Abs: 5.4 10*3/uL (ref 1.7–7.7)
Neutrophils Relative %: 63 %
Platelets: 224 10*3/uL (ref 150–400)
RBC: 4.27 MIL/uL (ref 4.22–5.81)
RDW: 15.1 % (ref 11.5–15.5)
WBC: 8.6 10*3/uL (ref 4.0–10.5)
nRBC: 0 % (ref 0.0–0.2)

## 2019-11-24 NOTE — Plan of Care (Signed)
  Problem: Consults Goal: RH GENERAL PATIENT EDUCATION Description: See Patient Education module for education specifics. Outcome: Progressing Goal: Skin Care Protocol Initiated - if Braden Score 18 or less Description: If consults are not indicated, leave blank or document N/A Outcome: Progressing Goal: Nutrition Consult-if indicated Outcome: Progressing Goal: Diabetes Guidelines if Diabetic/Glucose > 140 Description: If diabetic or lab glucose is > 140 mg/dl - Initiate Diabetes/Hyperglycemia Guidelines & Document Interventions  Outcome: Progressing   Problem: RH BLADDER ELIMINATION Goal: RH STG MANAGE BLADDER WITH ASSISTANCE Description: STG Manage Bladder With total Assistance Outcome: Progressing   Problem: RH SKIN INTEGRITY Goal: RH STG SKIN FREE OF INFECTION/BREAKDOWN Outcome: Progressing Goal: RH STG MAINTAIN SKIN INTEGRITY WITH ASSISTANCE Description: STG Maintain Skin Integrity With max Assistance. Outcome: Progressing Goal: RH STG ABLE TO PERFORM INCISION/WOUND CARE W/ASSISTANCE Description: STG Able To Perform Incision/Wound Care With max Assistance. Outcome: Progressing   Problem: RH SAFETY Goal: RH STG ADHERE TO SAFETY PRECAUTIONS W/ASSISTANCE/DEVICE Description: STG Adhere to Safety Precautions With max Assistance/Device. Outcome: Progressing Goal: RH STG DECREASED RISK OF FALL WITH ASSISTANCE Description: STG Decreased Risk of Fall With min Assistance. Outcome: Progressing   Problem: RH PAIN MANAGEMENT Goal: RH STG PAIN MANAGED AT OR BELOW PT'S PAIN GOAL Description: Less than 3 out of 10 Outcome: Progressing   Problem: Consults Goal: RH SPINAL CORD INJURY PATIENT EDUCATION Description:  See Patient Education module for education specifics.  Outcome: Progressing

## 2019-11-24 NOTE — Discharge Instructions (Signed)
Inpatient Rehab Discharge Instructions  [name] Discharge date and time: No discharge date for patient encounter.   Activities/Precautions/ Functional Status: Activity: activity as tolerated Diet: Low sodium Wound Care: keep wound clean and dry Functional status:  ___ No restrictions     ___ Walk up steps independently ___ 24/7 supervision/assistance   ___ Walk up steps with assistance ___ Intermittent supervision/assistance  ___ Bathe/dress independently ___ Walk with walker     _x__ Bathe/dress with assistance ___ Walk Independently    ___ Shower independently ___ Walk with assistance    ___ Shower with assistance ___ No alcohol     ___ Return to work/school ________    COMMUNITY REFERRALS UPON DISCHARGE:    Home Health:   PT    OT   RN   SNA                  Agency: Phone:    Medical Equipment/Items Ordered: hospital bed                                                 Agency/Supplier: Katie [phone]  Home oxygen remains with [employer] [facility] [phone].   *Application for [employer] has been completed and submitted. If you have any questions on the status of your application, be sure to contact [phone]   Special Instructions: No smoking or alcohol  Continue CPAP and oxygen needs as prior to admission  Wound care.  Apply Santyl ointment to the left buttock sacral wound and left lateral ankle wound in a nickel thick layer.  Cover with saline moistened gauze then sized appropriate foam dressing to be changed daily  Routine changing of Foley catheter tube.  Continue Lovenox 40 mg daily until 12/17/2019 and stop     My questions have been answered and I understand these instructions. I will adhere to these goals and the provided educational materials after my discharge from the hospital.  Patient/Caregiver Signature _______________________________ Date __________  Clinician Signature _______________________________________ Date  __________  Please bring this form and your medication list with you to all your follow-up doctor's appointments.

## 2019-11-24 NOTE — Progress Notes (Signed)
Patient ID: [name], male   DOB: 1940/08/21, 79 y.o.   MRN: [id number]  SW waiting on follow-up about HHPT/OT/CNA/RN (wound care) from Cory/[facility], Cheryl/[facility], Carolyn/[facility], Britney/[facility], and Tiffany/[facility].  SW spoke with pt wife [name] [phone]) to discuss challenges with [facility], and asked if they would be ok to d/c to home without services and SW will continue to work on trying to establish. Wife states if pt can be sent home with home exercise program, and has had education on wound care/lovenox injections.   *SW confirmed with medical team pt can d/c tomorrow. SW called pt wife and pt wife is amenable to d/c and would like wound care supplies; same pick up time 12pm. Wife would like [facility] [place] application submitted for pt. Reports that their church is helping with a w/c Lucianne Lei but it may not be available until next week sometime. SW called [facility] to schedule 12pm pick up. SW informed pt assigned Therapist, sports.   SW faxed [place] application #[phone]  Declined [facility]- declined due to unable to staff for OT/CNA [facility]- declined due to staffing [facility]- declined due to no nursing available [facility]- no out of network benefits [facility]- declined due to complexity of wounds [facility]- declined  Loralee Pacas, MSW, [place] Office: [phone] Cell: [phone] Fax: [phone]

## 2019-11-24 NOTE — Progress Notes (Signed)
[facility] PHYSICAL MEDICINE & REHABILITATION PROGRESS NOTE   Subjective/Complaints:  Pt reports he is supposed to be turned q2 hours (which is order) but per nursing, refuses sometimes as well.   Frustrated since wants to go home but need pt to be safe with H/H- don't have yet.    ROS:  Pt denies SOB, abd pain, CP, N/V/C/D, and vision changes Still having muscle spasms-pretty bad  Objective:   No results found. Recent Labs    11/24/19 0723  WBC 8.6  HGB 12.1*  HCT 38.2*  PLT 224   Recent Labs    11/24/19 0723  NA 139  K 3.8  CL 98  CO2 28  GLUCOSE 101*  BUN 23  CREATININE 0.96  CALCIUM 9.2    Intake/Output Summary (Last 24 hours) at 11/24/2019 1128 Last data filed at 11/24/2019 0939 Gross per 24 hour  Intake 960 ml  Output 2000 ml  Net -1040 ml     Physical Exam: Vital Signs Blood pressure (!) 108/57, pulse 70, temperature 98.4 F (36.9 C), temperature source Oral, resp. rate 20, height 5\' 7"  (1.702 m), weight 97.8 kg, SpO2 96 %. Constitutional: No distress . Lying on L side in bed- appropriate, frustrated, NAD HENT: conjugate gaze. Cardiovascular: RRR Respiratory: coarse, but pre-nebs, sounds great for him- good air movement GI: Soft, NT, ND, (+)BS  Skin: L ankle pressure ulcer and sacral ulcer- will assess in AM Psych:frustrated Musc: No edema in extremities.  No tenderness in extremities. Neuro: Alert Right lower extremity: 0/5, limited by tone and guarding, unchanged Left lower extremity: Hip flexion, knee extension 2+/5, ankle dorsiflexion 0/5, limited by tone and guarding, unchanged  Assessment/Plan: 1. 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  Physiatrist is providing close team supervision and 24 hour management of active medical  problems listed below.  Physiatrist and rehab team continue to assess barriers to discharge/monitor patient progress toward functional and medical goals  Care Tool:  Bathing    Body parts bathed by patient: Right arm, Left arm, Chest, Abdomen, Right upper leg, Left upper leg, Face   Body parts bathed by helper: Front perineal area, Buttocks, Right lower leg, Left lower leg Body parts n/a: Front perineal area, Buttocks, Right upper leg, Left upper leg, Right lower leg, Left lower leg   Bathing assist Assist Level: Moderate Assistance - Patient 50 - 74%     Upper Body Dressing/Undressing Upper body dressing   What is the patient wearing?: Pull over shirt    Upper body assist Assist Level: Contact Guard/Touching assist    Lower Body Dressing/Undressing Lower body dressing      What is the patient wearing?: Incontinence brief, Pants     Lower body assist Assist for lower body dressing: Dependent - Patient 0%     Toileting Toileting    Toileting assist Assist for toileting: Dependent - Patient 0%     Transfers Chair/bed transfer  Transfers assist     Chair/bed transfer assist level: Dependent - mechanical lift     Locomotion Ambulation   Ambulation assist   Ambulation activity did not occur: Safety/medical concerns          Walk 10 feet activity   Assist  Walk 10 feet activity did not occur: Safety/medical concerns        Walk 50 feet activity  Assist Walk 50 feet with 2 turns activity did not occur: Safety/medical concerns         Walk 150 feet activity   Assist Walk 150 feet activity did not occur: Safety/medical concerns         Walk 10 feet on uneven surface  activity   Assist Walk 10 feet on uneven surfaces activity did not occur: Safety/medical concerns         Wheelchair     Assist Will patient use wheelchair at discharge?: Yes Type of Wheelchair: Power Wheelchair activity did not occur: Safety/medical  concerns  Wheelchair assist level: Independent Max wheelchair distance: 500'    Wheelchair 50 feet with 2 turns activity    Assist    Wheelchair 50 feet with 2 turns activity did not occur: Safety/medical concerns   Assist Level: Independent   Wheelchair 150 feet activity     Assist  Wheelchair 150 feet activity did not occur: Safety/medical concerns   Assist Level: Independent   Blood pressure (!) 108/57, pulse 70, temperature 98.4 F (36.9 C), temperature source Oral, resp. rate 20, height 5\' 7"  (1.702 m), weight 97.8 kg, SpO2 96 %.  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 Status post L3-4 lumbar laminectomy decompression  Patient was to be discharged on 6/4 however family teaching and DME incomplete  6/7- trying to get H/H so pt can go home- pending some nursing training- lovenox, wound care 2. Antithrombotics:  -DVT/anticoagulation:Subcutaneous Lovenox. Recent venous Doppler studies negative. -antiplatelet therapy: Aspirin 81 mg daily 3. Pain Management:Baclofen 30 mg 3 times daily and 40 mg nightly, Neurontin 100 mg nightly, hydrocodone and Robaxin as needed. Imitrex as needed for headaches  Would consider Botox 150-200 Units each hamstring  D/ced zanaflex per pt request- says he knows it was for spasticity  Patient not sure he wants to go to sleep for surgery again- not sure if wants ITB pump  Valium 1 mg DC'd on 6/5, as patient states this made spasms worse  6/7- only on baclofen now 4. Mood:Trazodone as needed -antipsychotic agents: N/A 5. Neuropsych: This patientiscapable of making decisions on hisown behalf. 6. Skin/Wound Care: unstageable sacral ulcer  Apply Santyl ointment to left buttock sacral wound and left lateral ankle wound in a nickel thick layer. Cover with saline moistened gauze then sized  appropriate foam dressing changed daily, as discussed with PT/Wound care may benefit from hydrotherapy after 1-2 wks of colllagenase   Low air loss mattress at home due to wound and to be turned frequently.  7. Fluids/Electrolytes/Nutrition:Routine in and outs with follow-up chemistries 8. Chronic restless leg syndrome. Sinemet 10-100 mg twice daily 9. Myoclonic jerking.    Keppra DC'd  Improving  10. OSA/chronic respiratory failure. Continue CPAP and oxygen as directed with nebulizer treatments  Duonebs at 8am in morning  Albuterol inhalers prn- with spacer 11. Neurogenic bowel bladder.  Flomax 0.8 mg daily.  12. Hypothyroidism.  Continue Synthroid 13. 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 Follow-up outpatient 14. Chronic diastolic congestive heart failure. Monitor for any signs of fluid overload. Lasix 40 mg daily. Continue Aldactone 25 mg daily  Filed Weights   11/22/19 0500 11/23/19 0435 11/24/19 0352  Weight: 97.5 kg 98 kg 97.8 kg   6/7- weight stable- con't regimen 15. Hypertension.   Toprol decreased to 25mg    Labile on 6/6, monitor for hypotension  6/7- BP soft- 108/57- con't regimen 16. Hyperlipidemia. Lipitor 17. Gout.  Zyloprim 300 mg daily changed to colchicine.  Indomethacin 50 mg BID course completed 18. Earwax: Improved  6/7- pt wants earwax taken out- explained can flush it- he's not happy with plan, but ENT doesn't allow [place] to use "scoops" anymore 19. Nasal stuffiness: Improved 20. DM  6/4- pt said today was OK with CBGs checks- however prior said was NOT-on BMP BGs look OK, but will recheck BMPs/CBC tomorrow  6/7- BG 101 on BMP 21.  Leukocytosis  WBCs 10.9 on 5/25, labs ordered for tomorrow  6/7- WBC nml at 8.6- con't to monitor weekly  Afebrile  LOS: 14 days A FACE TO FACE EVALUATION WAS PERFORMED  [name] 11/24/2019, 11:28 AM

## 2019-11-25 MED ORDER — BLOOD GLUCOSE METER KIT: PACK | 0 refills | Status: DC

## 2019-11-25 NOTE — Progress Notes (Signed)
[facility] PHYSICAL MEDICINE & REHABILITATION PROGRESS NOTE   Subjective/Complaints:  Pt reports bed is better- not on O2- fell off/came off- Sats 89% at rest- is trending down- was 91% when entered room.  Ready for d/c- will change foley so has a full month until needs to be changed again.    ROS:   Pt denies SOB, abd pain, CP, N/V/C/D, and vision changes   Objective:   No results found. Recent Labs    11/24/19 0723  WBC 8.6  HGB 12.1*  HCT 38.2*  PLT 224   Recent Labs    11/24/19 0723  NA 139  K 3.8  CL 98  CO2 28  GLUCOSE 101*  BUN 23  CREATININE 0.96  CALCIUM 9.2    Intake/Output Summary (Last 24 hours) at 11/25/2019 0847 Last data filed at 11/25/2019 0254 Gross per 24 hour  Intake 1200 ml  Output 2050 ml  Net -850 ml     Physical Exam: Vital Signs Blood pressure (!) 93/40, pulse 73, temperature 98.2 F (36.8 C), temperature source Axillary, resp. rate 18, height 5\' 7"  (1.702 m), weight 98.4 kg, SpO2 95 %. Constitutional: No distress . Brighter affect; lying on L side, appropriate, NAD HENT: conjugate gaze. Cardiovascular: RRR Respiratory: stable coarseness- no change since yesterday GI: Soft, NT, ND, (+)BS  Skin: L ankle pressure ulcer and sacral ulcer- d/c today Psych:appropriate Musc: No edema in extremities.  No tenderness in extremities. Neuro: Alert Right lower extremity: 0/5, limited by tone and guarding, unchanged Left lower extremity: Hip flexion, knee extension 2+/5, ankle dorsiflexion 0/5, limited by tone and guarding, unchanged  Assessment/Plan: 1. 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  Physiatrist is providing close team supervision and 24 hour management of active medical problems listed below.  Physiatrist and rehab  team continue to assess barriers to discharge/monitor patient progress toward functional and medical goals  Care Tool:  Bathing    Body parts bathed by patient: Right arm, Left arm, Chest, Abdomen, Right upper leg, Left upper leg, Face   Body parts bathed by helper: Front perineal area, Buttocks, Right lower leg, Left lower leg Body parts n/a: Front perineal area, Buttocks, Right upper leg, Left upper leg, Right lower leg, Left lower leg   Bathing assist Assist Level: Moderate Assistance - Patient 50 - 74%     Upper Body Dressing/Undressing Upper body dressing   What is the patient wearing?: Pull over shirt    Upper body assist Assist Level: Contact Guard/Touching assist    Lower Body Dressing/Undressing Lower body dressing      What is the patient wearing?: Incontinence brief, Pants     Lower body assist Assist for lower body dressing: Dependent - Patient 0%     Toileting Toileting    Toileting assist Assist for toileting: Dependent - Patient 0%     Transfers Chair/bed transfer  Transfers assist     Chair/bed transfer assist level: Dependent - mechanical lift     Locomotion Ambulation   Ambulation assist   Ambulation activity did not occur: Safety/medical concerns          Walk 10 feet activity   Assist  Walk 10 feet activity did not occur: Safety/medical concerns        Walk 50 feet activity   Assist Walk 50 feet with 2 turns activity  did not occur: Safety/medical concerns         Walk 150 feet activity   Assist Walk 150 feet activity did not occur: Safety/medical concerns         Walk 10 feet on uneven surface  activity   Assist Walk 10 feet on uneven surfaces activity did not occur: Safety/medical concerns         Wheelchair     Assist Will patient use wheelchair at discharge?: Yes Type of Wheelchair: Power Wheelchair activity did not occur: Safety/medical concerns  Wheelchair assist level: Independent Max  wheelchair distance: 500'    Wheelchair 50 feet with 2 turns activity    Assist    Wheelchair 50 feet with 2 turns activity did not occur: Safety/medical concerns   Assist Level: Independent   Wheelchair 150 feet activity     Assist  Wheelchair 150 feet activity did not occur: Safety/medical concerns   Assist Level: Independent   Blood pressure (!) 93/40, pulse 73, temperature 98.2 F (36.8 C), temperature source Axillary, resp. rate 18, height 5\' 7"  (1.702 m), weight 98.4 kg, SpO2 95 %.  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 Status post L3-4 lumbar laminectomy decompression  Patient was to be discharged on 6/4 however family teaching and DME incomplete  6/7- trying to get H/H so pt can go home- pending some nursing training- lovenox, wound care 2. Antithrombotics:  -DVT/anticoagulation:Subcutaneous Lovenox. Recent venous Doppler studies negative. -antiplatelet therapy: Aspirin 81 mg daily 3. Pain Management:Baclofen 30 mg 3 times daily and 40 mg nightly, Neurontin 100 mg nightly, hydrocodone and Robaxin as needed. Imitrex as needed for headaches  Would consider Botox 150-200 Units each hamstring  D/ced zanaflex per pt request- says he knows it was for spasticity  Patient not sure he wants to go to sleep for surgery again- not sure if wants ITB pump  Valium 1 mg DC'd on 6/5, as patient states this made spasms worse  6/7- only on baclofen now 4. Mood:Trazodone as needed -antipsychotic agents: N/A 5. Neuropsych: This patientiscapable of making decisions on hisown behalf. 6. Skin/Wound Care: unstageable sacral ulcer  Apply Santyl ointment to left buttock sacral wound and left lateral ankle wound in a nickel thick layer. Cover with saline moistened gauze then sized appropriate foam dressing changed daily, as discussed  with PT/Wound care may benefit from hydrotherapy after 1-2 wks of colllagenase   Low air loss mattress at home due to wound and to be turned frequently.  6/8- arranging to have at home- has been delivered  7. Fluids/Electrolytes/Nutrition:Routine in and outs with follow-up chemistries 8. Chronic restless leg syndrome. Sinemet 10-100 mg twice daily 9. Myoclonic jerking.    Keppra DC'd  Improving  10. OSA/chronic respiratory failure. Continue CPAP and oxygen as directed with nebulizer treatments  Duonebs at 8am in morning  Albuterol inhalers prn- with spacer 11. Neurogenic bowel bladder.  Flomax 0.8 mg daily.  12. Hypothyroidism.  Continue Synthroid 13. 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 Follow-up outpatient 14. Chronic diastolic congestive heart failure. Monitor for any signs of fluid overload. Lasix 40 mg daily. Continue Aldactone 25 mg daily  Filed Weights   11/23/19 0435 11/24/19 0352 11/25/19 0324  Weight: 98 kg 97.8 kg 98.4 kg   6/7- weight stable- con't regimen 15. Hypertension.   Toprol decreased to 25mg    Labile on 6/6, monitor for hypotension  6/7- BP soft- 108/57- con't regimen 16. Hyperlipidemia. Lipitor  17. Gout. Zyloprim 300 mg daily changed to colchicine.  Indomethacin 50 mg BID course completed 18. Earwax: Improved  6/7- pt wants earwax taken out- explained can flush it- he's not happy with plan, but ENT doesn't allow [place] to use "scoops" anymore 19. Nasal stuffiness: Improved 20. DM  6/4- pt said today was OK with CBGs checks- however prior said was NOT-on BMP BGs look OK, but will recheck BMPs/CBC tomorrow  6/7- BG 101 on BMP 21.  Leukocytosis  WBCs 10.9 on 5/25, labs ordered for tomorrow  6/7- WBC nml at 8.6- con't to monitor weekly  Afebrile 22. Dispo  6/8- d/c today- has had ambulance arranged per SW and they are actively working on H/H still. - will change foley prior to d/c.    LOS: 15 days A FACE TO FACE EVALUATION WAS  PERFORMED  Julyan Gales 11/25/2019, 8:47 AM

## 2019-11-25 NOTE — Progress Notes (Signed)
Pt was DC via ambulance to personal residence. Pt had all questions answered and left with all personal belongings. Doy Hutching, LPN

## 2019-11-26 NOTE — Progress Notes (Signed)
Inpatient Rehabilitation Care Coordinator  Discharge Note  The overall goal for the admission was met for:   Discharge location: Yes. D/c to home via ambulance transport.   Length of Stay: Yes. 14 days.   Discharge activity level: Yes. Max Asst.  Home/community participation: Yes. Limited.   Services provided included: MD, RD, PT, OT, SLP, RN, CM, TR, Pharmacy, Neuropsych and SW  Financial Services: Private Insurance: [employer] Medicare  Follow-up services arranged: Home Health: Unable to obtain due to staffing and/or insurance, DME: [facility]- hospital bed and Patient/Family request agency HH: [facility] or [facility], DME: N/A  Comments (or additional information): contact pt wife Susan ([phone])  Patient/Family verbalized understanding of follow-up arrangements: Yes  Individual responsible for coordination of the follow-up plan: Pt wife to help coordinate care needs.   Confirmed correct DME delivered:  A  11/26/2019     A  

## 2019-11-27 NOTE — Progress Notes (Signed)
Patient ID: [name], male   DOB: 01/24/1941, 79 y.o.   MRN: [id number]   Sw received email from [place] that patient has ran out of dressing supplies, is low on santyl ointment and patient has not received HHPT.   SW followed up with nursing: Orders for when santyl ointment is ran out, use wet to dry dressing. Dressing supplies are provided at discharge, afterward is a private cost. SW call spouse to call and provide this information. SW will have [facility] follow up with PT and spouse tomorrow

## 2019-11-28 ENCOUNTER — Telehealth: Payer: Self-pay | Admitting: *Deleted

## 2019-11-28 NOTE — Telephone Encounter (Signed)
Transitional Care call--I spoke with wife Cordelro Gautreau and Mr Schroeter in background on 11/27/19 @ 2:29 pm.    1. Are you/is patient experiencing any problems since coming home? Are there any questions regarding any aspect of care? They are out of dressing supplies and down to 1 1/2 tube of Santyl ointment. Mrs Richens reports she just spent $138 dollars purchasing items herself while they wait for [facility] to be arranged, which would have SN assisting with dressing changes. At this time they have not been able to be connected with a HH that can provide the service. Also in need of exercises due to no HHPT/OT currently. I have reached back out to Broadus Lorenza and [place] MSW on inpt rehab about these issues. 2. Are there any questions regarding medications administration/dosing? Are meds being taken as prescribed? Patient should review meds with caller to confirm. They have all medications --see above mention of Santyl ointment 3. Have there been any falls? NO 4. Has Home Health been to the house and/or have they contacted you? If not, have you tried to contact them? Can we help you contact them? At this time there has been no agencies able to cover-still waiting on commitment. 5. Are bowels and bladder emptying properly? Are there any unexpected incontinence issues? If applicable, is patient following bowel/bladder programs? He has a foley and urine appears cloudy.  Wife took sample to PCP and he is + UTI. They have prescribed Cipro 6. Any fevers, problems with breathing, unexpected pain? NO 7. Are there any skin problems or new areas of breakdown? Has the sacral wound being dressed- efforts to get into wound clinic in process. 8. Has the patient/family member arranged specialty MD follow up (ie cardiology/neurology/renal/surgical/etc)?  Can we help arrange? They are working on this but transportation is another issue.  MSW trying to get them a van 9. Does the patient need any other services or support that we can help  arrange?MSW trying to assist.  10. Are caregivers following through as expected in assisting the patient? Yes  11. Has the patient quit smoking, drinking alcohol, or using drugs as recommended? Yes  Appointment 12/08/19 Monday @ 10:40, arrive by 10:20 to see Danella Sensing NP. Alerted to watch for packet in mail from our office. Address reviewed. [place]

## 2019-12-05 ENCOUNTER — Ambulatory Visit: Payer: Medicare Other | Admitting: Podiatry

## 2019-12-08 ENCOUNTER — Encounter: Payer: Medicare Other | Attending: Registered Nurse | Admitting: Registered Nurse

## 2019-12-08 ENCOUNTER — Encounter: Payer: Self-pay | Admitting: Registered Nurse

## 2019-12-08 ENCOUNTER — Other Ambulatory Visit: Payer: Self-pay

## 2019-12-08 VITALS — BP 97/50 | HR 73 | Temp 96.6°F | Ht 67.0 in | Wt 211.0 lb

## 2019-12-08 DIAGNOSIS — Z9889 Other specified postprocedural states: Secondary | ICD-10-CM | POA: Diagnosis present

## 2019-12-08 DIAGNOSIS — I10 Essential (primary) hypertension: Secondary | ICD-10-CM | POA: Diagnosis present

## 2019-12-08 DIAGNOSIS — I5032 Chronic diastolic (congestive) heart failure: Secondary | ICD-10-CM | POA: Diagnosis present

## 2019-12-08 DIAGNOSIS — G959 Disease of spinal cord, unspecified: Secondary | ICD-10-CM | POA: Diagnosis present

## 2019-12-08 DIAGNOSIS — L03115 Cellulitis of right lower limb: Secondary | ICD-10-CM

## 2019-12-08 DIAGNOSIS — L89159 Pressure ulcer of sacral region, unspecified stage: Secondary | ICD-10-CM | POA: Diagnosis present

## 2019-12-08 DIAGNOSIS — G8222 Paraplegia, incomplete: Secondary | ICD-10-CM | POA: Diagnosis present

## 2019-12-08 MED ORDER — HYDROCODONE-ACETAMINOPHEN 5-325 MG PO TABS: 1.0000 | ORAL_TABLET | Freq: Three times a day (TID) | ORAL | 0 refills | Status: DC | PRN

## 2019-12-08 MED ORDER — DOXYCYCLINE HYCLATE 100 MG PO CAPS: 100.0000 mg | ORAL_CAPSULE | Freq: Two times a day (BID) | ORAL | 0 refills | Status: DC

## 2019-12-08 NOTE — Progress Notes (Signed)
Subjective:    Patient ID: [name], male    DOB: June 03, 1941, 79 y.o.   MRN: [id number]  HPI: [name] is a 79 y.o. male who is here for Transitional Care Visit in follow up of his Cervical Myelopathy, Incomplete Paraplegia, S/P Lumbar laminectomy, Essential Hypertension and Chronic Diastolic Congestive Heart Failure and Cellulitis of Right Lower Extremity.  He presented to [facility] on 03/31/2021with generalize fatigue  Over the last few days with decrease functional mobility. Neurosurgery was consulted.  CT Head WO Contrast:  IMPRESSION: 1.  No acute intracranial findings.  2.  Chronic microvascular ischemic change and cerebral volume loss.  MRI Cervical Spine:  IMPRESSION: 1. Technically limited exam due to extensive motion artifact. 2. Postoperative changes from prior ACDF at C3-C6. Residual endplate osseous ridging with uncovertebral and facet hypertrophy at these levels, with persistent mild to moderate diffuse spinal stenosis with moderate to severe bilateral foraminal narrowing as above. Overall, appearance is improved relative to preoperative exam from 2019. 3. Patchy cord signal abnormality at the level of C3-4, consistent with chronic myelomalacia.  MRI Thoracic Spine WO Contrast:  IMPRESSION: 1. Limited examination due to motion artifact and lack of IV contrast. 2. There is an area of hyperintense T2-weighted signal within the lower thoracic spinal cord. This is incompletely evaluated due to motion artifact and lack of IV contrast. Given the limitations of the study, a repeat examination with sedation or anesthesia might be helpful. 3. 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   He underwent by Dr Zada Finders and Dr Kathyrn Sheriff on 11/04/2019 LUMBAR THREE-FOUR LUMBAR LAMINECTOMY/ DECOMPRESSION WITH MET-RX  Wound-care was consulted on 10/23/2019 for left ankle ulcer and left buttock ulcer. The  [facility] was called today and [name] was scheduled for [place] appointment for 12/10/2019, [name] understanding. Transportaion was also called, they verbalize understanding.   [name] was admitted to Inpatient Rehabilitation on 11/10/2019 and discharged home on 11/25/2019. [name] had a long complicated hospital stay see discharge summary for more details.   He arrived to office today in a motorized wheelchair, he states he has pain in his buttocks. He rates his pain 4. Noted to have left heel ulcer, site cleansed and dressing applied. Mrs. March reports Mr [name] had right foot swelling last week with redness noted, eschar noted  As well. Skin noted with reddness and swelling. Spoke with Dr Posey Pronto and he assessed [name] right foot as well. We will prescribe Doxycycline, [name] understanding and he will follow up with the [facility].   Pain Inventory Average Pain 8 Pain Right Now 4 My pain is constant, sharp and on left buttock.  In the last 24 hours, has pain interfered with the following? General activity 8 Relation with others 6 Enjoyment of life 10 What TIME of day is your pain at its worst? all the time. Sleep (in general) Fair  Pain is worse with: sitting and moving. Pain improves with: medication Relief from Meds: 3  Mobility walk with assistance how many minutes can you walk? Power wheelchair in use. ability to climb steps?  no do you drive?  no use a wheelchair needs help with transfers Do you have any goals in this area?  yes  Function disabled: date disabled 2019 retired I need assistance with the following:  dressing, bathing, toileting, meal prep, household duties and shopping  Neuro/Psych bladder control problems  weakness spasms  Prior Studies Any changes since last visit?  no New patient.  Physicians involved in your care New patient.   Family History  Problem Relation Age of Onset  . COPD  Mother   . Hyperlipidemia Mother   . Hypertension Mother   . Diabetes Father   . Kidney disease Father        ESRD  . Hypertension Father   . Cancer Father   . Hyperlipidemia Father    Social History   Socioeconomic History  . Marital status: Married    Spouse name: [name]  . Number of children: 2  . Years of education: COLLEGE  . Highest education level: Not on file  Occupational History    Employer: RETIRED  Tobacco Use  . Smoking status: Former Smoker    Years: 40.00    Types: Cigars, Cigarettes    Quit date: 06/19/2005    Years since quitting: 14.4  . Smokeless tobacco: Never Used  . Tobacco comment: smoked 4-6 cigars daily, only smoked cigarettes socially  Vaping Use  . Vaping Use: Never used  Substance and Sexual Activity  . Alcohol use: No  . Drug use: No  . Sexual activity: Not on file  Other Topics Concern  . Not on file  Social History Narrative   Patient is married [name]) and lives at home with his wife.   Patient has two children.   Patient is retired.   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   Patient is right-handed.   Patient drinks three cups of coffee per day.   Social Determinants of Health   Financial Resource Strain:   . Difficulty of Paying Living Expenses:   Food Insecurity:   . Worried About Charity fundraiser in the Last Year:   . Arboriculturist in the Last Year:   Transportation Needs:   . Film/video editor (Medical):   [name] Lack of Transportation (Non-Medical):   Physical Activity:   . Days of Exercise per Week:   . Minutes of Exercise per Session:   Stress:   . Feeling of Stress :   Social Connections:   . Frequency of Communication with Friends and Family:   . Frequency of Social Gatherings with Friends and Family:   . Attends Religious Services:   . Active Member of Clubs or Organizations:   . Attends Archivist Meetings:   [name] Marital Status:    Past Surgical History:  Procedure  Laterality Date  . ANTERIOR CERVICAL DECOMP/DISCECTOMY FUSION N/A 09/03/2017   Procedure: ACDF - C3-C4 - C4-C5 - C5-C6;  Surgeon: Kary Kos, MD;  Location: [facility];  Service: Neurosurgery;  Laterality: N/A;  . CAROTID ENDARTERECTOMY Left January 03, 2006   Dr. Amedeo Plenty  . COLONOSCOPY    . EYE SURGERY Bilateral    cataract surgery with lens implants  . game keepers thumb Right   . JOINT REPLACEMENT Left    knee X 2  . LUMBAR LAMINECTOMY/ DECOMPRESSION WITH MET-RX N/A 11/04/2019   Procedure: LUMBAR THREE-FOUR LUMBAR LAMINECTOMY/ DECOMPRESSION WITH MET-RX;  Surgeon: Judith Part, MD;  Location: [facility];  Service: Neurosurgery;  Laterality: N/A;  . RADIOLOGY WITH ANESTHESIA N/A 10/16/2019   Procedure: MRI THORACIC LUMBAR SPINE;  Surgeon: Radiologist, Medication, MD;  Location: [facility];  Service: Radiology;  Laterality: N/A;  . ROTATOR CUFF REPAIR Right   . THYROIDECTOMY  2008   Past Medical History:  Diagnosis Date  .  Arthritis   . Barrett esophagus   . Bronchitis   . Carotid artery occlusion   . COPD (chronic obstructive pulmonary disease) ([place])   . Diabetes mellitus without complication ([place])   . GERD (gastroesophageal reflux disease)   . Headache    migraines  . History of thyroid cancer 2008  . Hypertension   . Restless leg   . Sleep apnea with use of continuous positive airway pressure (CPAP)    2011 [facility] , AHI  77cn central and obstrcutive. 16 cm water , 3 cm EPR.   . Thoracic ascending aortic aneurysm (HCC)    4.2 cm ascending TAA 08/18/17 CTA. Annual imaging recommended.  . thyroid ca dx'd 2009 or 2010   surg and radioactive isoptope  . Ulcer    Peptic ulcer disease   BP (!) 89/56   Pulse 77   Temp (!) 96.6 F (35.9 C)   Ht '5\' 7"'  (1.702 m)   Wt 211 lb (95.7 kg) Comment: est- pt in motor wheelchair  SpO2 91%   BMI 33.05 kg/m   Opioid Risk Score:   Fall Risk Score:  `1  Depression screen PHQ 2/9  No flowsheet data found. Review of Systems  HENT: Negative.    Eyes: Negative.   Respiratory: Negative.   Cardiovascular: Negative.   Gastrointestinal: Negative.   Endocrine: Negative.   Genitourinary:       Foley in place  Skin: Positive for wound.       Buttock, ankles  Allergic/Immunologic: Negative.   Neurological:       Spasms  Psychiatric/Behavioral: Positive for confusion.       Objective:   Physical Exam Vitals and nursing note reviewed.  Constitutional:      Appearance: Normal appearance.  Cardiovascular:     Rate and Rhythm: Normal rate and regular rhythm.     Pulses: Normal pulses.     Heart sounds: Normal heart sounds.  Pulmonary:     Effort: Pulmonary effort is normal.     Breath sounds: Normal breath sounds.  Musculoskeletal:     Cervical back: Normal range of motion and neck supple.     Comments: Normal Muscle Bulk and Muscle Testing Reveals:  Upper Extremities: Right: Full ROM and Muscle Strength 5/5 Left: Upper Extremity: Decreased ROM 45 Degrees and Muscle Strength 5/5 Lower Extremities: Wearing Bilateral Foot Braces  Arrived in Motorized Wheelchair  Skin:    General: Skin is warm and dry.     Comments: Right Foot with Redness, Dry eschar and swelling noted  Neurological:     Mental Status: He is alert and oriented to person, place, and time.  Psychiatric:        Mood and Affect: Mood normal.        Behavior: Behavior normal.           Assessment & Plan:  1.Cervical Myelopathy: Incomplete Paraplegia: Continue HEP as Tolerated. [name] still waiting for Home Health Therapy to be set upi.  2.S/P Lumbar laminectomy: Dr. Zada Finders Following. 3 Essential Hypertension: Continue current Medication regimen. PCP Following.  4.Chronic Diastolic Congestive Heart Failure: PCP Following . Continue to Monitor.  5. Cellulitis of Right Lower Extremity. RX: Doxycycline and he [facility].  6. Sacral Decubitus Ulcer: F/U with [facility]. Appointment and Transportation scheduled  today.   60 minutes of face to face patient care time was spent during this visit. All questions were encouraged and answered.  F/U with Dr  Lovorn in 4-6 weeks

## 2019-12-10 ENCOUNTER — Other Ambulatory Visit (HOSPITAL_COMMUNITY)
Admission: RE | Admit: 2019-12-10 | Discharge: 2019-12-10 | Disposition: A | Discharge status: Home / Self Care | Payer: Medicare Other | Source: Other Acute Inpatient Hospital | Attending: Physician Assistant | Admitting: Physician Assistant

## 2019-12-10 ENCOUNTER — Encounter (HOSPITAL_BASED_OUTPATIENT_CLINIC_OR_DEPARTMENT_OTHER): Payer: Medicare Other | Attending: Physician Assistant | Admitting: Physician Assistant

## 2019-12-10 DIAGNOSIS — X58XXXA Exposure to other specified factors, initial encounter: Secondary | ICD-10-CM | POA: Diagnosis not present

## 2019-12-10 DIAGNOSIS — M6281 Muscle weakness (generalized): Secondary | ICD-10-CM | POA: Diagnosis not present

## 2019-12-10 DIAGNOSIS — I110 Hypertensive heart disease with heart failure: Secondary | ICD-10-CM | POA: Diagnosis not present

## 2019-12-10 DIAGNOSIS — L089 Local infection of the skin and subcutaneous tissue, unspecified: Secondary | ICD-10-CM | POA: Diagnosis not present

## 2019-12-10 DIAGNOSIS — L97518 Non-pressure chronic ulcer of other part of right foot with other specified severity: Secondary | ICD-10-CM | POA: Insufficient documentation

## 2019-12-10 DIAGNOSIS — S31000A Unspecified open wound of lower back and pelvis without penetration into retroperitoneum, initial encounter: Secondary | ICD-10-CM | POA: Insufficient documentation

## 2019-12-10 DIAGNOSIS — E11621 Type 2 diabetes mellitus with foot ulcer: Secondary | ICD-10-CM | POA: Insufficient documentation

## 2019-12-10 DIAGNOSIS — L89520 Pressure ulcer of left ankle, unstageable: Secondary | ICD-10-CM | POA: Diagnosis not present

## 2019-12-10 DIAGNOSIS — E1151 Type 2 diabetes mellitus with diabetic peripheral angiopathy without gangrene: Secondary | ICD-10-CM | POA: Insufficient documentation

## 2019-12-10 DIAGNOSIS — L89150 Pressure ulcer of sacral region, unstageable: Secondary | ICD-10-CM | POA: Diagnosis present

## 2019-12-10 DIAGNOSIS — I509 Heart failure, unspecified: Secondary | ICD-10-CM | POA: Insufficient documentation

## 2019-12-10 DIAGNOSIS — L89893 Pressure ulcer of other site, stage 3: Secondary | ICD-10-CM | POA: Insufficient documentation

## 2019-12-10 NOTE — Progress Notes (Addendum)
[name], [name] ([id number]) Visit Report for 12/10/2019 Chief Complaint Document Details Patient Name: Date of Service: [name] 12/10/2019 9:00 A M Medical Record Number: [id number] Patient Account Number: [id number] Date of Birth/Sex: Treating RN: [birth year] (79 y.o. Male) Baruch Gouty Primary Care Provider: [name] Other Clinician: Referring Provider: Treating Provider/Extender: [name] in Treatment: 0 Information Obtained from: Patient Chief Complaint Sacral, right achilles, left ankle, and right dorsal foot ulcers Electronic Signature(s) Signed: 12/10/2019 10:58:33 AM By: Worthy Keeler PA-C Entered By: Worthy Keeler on 12/10/2019 10:58:32 -------------------------------------------------------------------------------- Debridement Details Patient Name: Date of Service: MA [facility], [place] M. 12/10/2019 9:00 A M Medical Record Number: [id number] Patient Account Number: [id number] Date of Birth/Sex: Treating RN: September 01, 1940 (79 y.o. Male) Baruch Gouty Primary Care Provider: [name] Other Clinician: Referring Provider: Treating Provider/Extender: [name] in Treatment: 0 Debridement Performed for Assessment: Wound #1 Sacrum Performed By: Physician Worthy Keeler, PA Debridement Type: Debridement Level of Consciousness (Pre-procedure): Awake and Alert Pre-procedure Verification/Time Out Yes - 11:10 Taken: Start Time: 11:10 Pain Control: Other : benzocaine 20% spray T Area Debrided (L x W): otal 4 (cm) x 4 (cm) = 16 (cm) Tissue and other material debrided: Viable, Non-Viable, Eschar, Slough, Subcutaneous, Slough Level: Skin/Subcutaneous Tissue Debridement Description: Excisional Instrument: Blade, Forceps, Scissors Specimen: Swab, Number of Specimens T aken: 1 Bleeding: Minimum Hemostasis Achieved: Pressure End Time: 11:17 Procedural Pain: 8 Post Procedural Pain: 5 Response to  Treatment: Procedure was tolerated well Level of Consciousness (Post- Awake and Alert procedure): Post Debridement Measurements of Total Wound Length: (cm) 7 Stage: Unstageable/Unclassified Width: (cm) 4 Depth: (cm) 3.2 Volume: (cm) 70.372 Character of Wound/Ulcer Post Debridement: Requires Further Debridement Post Procedure Diagnosis Same as Pre-procedure Electronic Signature(s) Signed: 12/10/2019 4:25:30 PM By: Baruch Gouty RN, BSN Signed: 12/12/2019 3:47:20 PM By: Worthy Keeler PA-C Entered By: Baruch Gouty on 12/10/2019 11:21:19 -------------------------------------------------------------------------------- HPI Details Patient Name: Date of Service: MA [facility], [place]. 12/10/2019 9:00 A M Medical Record Number: [id number] Patient Account Number: [id number] Date of Birth/Sex: Treating RN: 1940-07-22 (79 y.o. Male) Baruch Gouty Primary Care Provider: [name] Other Clinician: Referring Provider: Treating Provider/Extender: [name] in Treatment: 0 History of Present Illness HPI Description: 12/10/2019 upon evaluation today patient appears for initial evaluation here in our clinic unfortunately due to having multiple pressure injuries noted at this point. Including in the sacral region, left ankle, and Achilles region. He also has an ulcer on top of his foot but this appears to be mainly dry I do not even know that there is really a major issue here but nonetheless we are going to continue to monitor this and to the eschar pops off. He still is having some pain he also had apparently some infection around this area that his primary care provider placed him on antibiotic for. The patient does have hypertension and also muscle weakness that came on all of a sudden at the beginning of this year around March and unfortunately he has not been able to walk since that time prior to that he was walking with some weakness due to spinal stenosis but  nothing like what he is experiencing right now. He does spend a lot of his day sitting though he does reposition in his chair I am not sure if that is sufficient or not we will have to see how things progress. Nonetheless the sacral wound is definitely the  worst part of everything that he has at this point. He does have diabetes which is very well controlled most recent hemoglobin A1c was 5.4. Electronic Signature(s) Signed: 12/10/2019 11:25:16 AM By: Worthy Keeler PA-C Entered By: Worthy Keeler on 12/10/2019 11:25:16 -------------------------------------------------------------------------------- Physical Exam Details Patient Name: Date of Service: MA [facility], [place] M. 12/10/2019 9:00 A M Medical Record Number: [id number] Patient Account Number: [id number] Date of Birth/Sex: Treating RN: 14-Sep-1940 (79 y.o. Male) Baruch Gouty Primary Care Provider: [name] Other Clinician: Referring Provider: Treating Provider/Extender: [name] in Treatment: 0 Constitutional sitting or standing blood pressure is within target range for patient.. pulse regular and within target range for patient.[name] respirations regular, non-labored and within target range for patient.[name] temperature within target range for patient.. Well-nourished and well-hydrated in no acute distress. Eyes conjunctiva clear no eyelid edema noted. pupils equal round and reactive to light and accommodation. Ears, Nose, Mouth, and Throat no gross abnormality of ear auricles or external auditory canals. normal hearing noted during conversation. mucus membranes moist. Respiratory normal breathing without difficulty. Cardiovascular Absent posterior tibial and dorsalis pedis pulses bilateral lower extremities. trace pitting edema of the bilateral lower extremities. Musculoskeletal Patient unable to walk. Psychiatric this patient is able to make decisions and demonstrates good insight into disease  process. Alert and Oriented x 3. pleasant and cooperative. Notes Upon inspection patient's wounds actually appear to be doing fairly okay in regard to the lower extremities. He in regard to the ankle as well as the Achilles region both appear to be fairly clean with good granulation and overall I think he is doing quite well in that regard. With regard to the dorsal foot on the right this does not appear to be a major issue he does have some eschar covering and it may pop off eventually I cannot quite tell whether it is really healed underneath or not it hurts and so not can try to force this issue. Electronic Signature(s) Signed: 12/10/2019 11:26:16 AM By: Worthy Keeler PA-C Entered By: Worthy Keeler on 12/10/2019 11:26:16 -------------------------------------------------------------------------------- Physician Orders Details Patient Name: Date of Service: MA [facility], [place] M. 12/10/2019 9:00 A M Medical Record Number: [id number] Patient Account Number: [id number] Date of Birth/Sex: Treating RN: 01/08/1941 (78 y.o. 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 - allow extra time, need Harrel Lemon Return A Dressing Change Frequency Wound #1 Sacrum Change dressing every day. Wound #2 Left,Lateral Malleolus Change Dressing every other day. Wound #3 Right Achilles Change Dressing  every other day. Wound #4 Right,Dorsal Foot Change dressing every day. Wound Cleansing Wound #1 Sacrum Clean wound with Wound Cleanser Wound #2 Left,Lateral Malleolus May shower and wash wound with soap and water. Wound #3 Right Achilles May shower and wash wound with soap and water. Wound #4 Right,Dorsal Foot May shower and wash wound with soap and water. Primary Wound Dressing Wound #1 Sacrum Other: - Vashe moistened gauze packing Wound #2 Left,Lateral Malleolus Silver Collagen - moisten with saline  Wound #3 Right Achilles Silver Collagen - moisten with saline Wound #4 Right,Dorsal Foot Other: - paint with betadine Secondary Dressing Wound #1 Sacrum ABD pad - or foam border Wound #2 Left,Lateral Malleolus Foam Border - or large bandaid Wound #3 Right Achilles Foam Border - or large bandaid Off-Loading Multipodus Splint to: - or bunny boots to both feet especially when in bed Low air-loss mattress (Group 2) Turn and reposition every 2 hours Laboratory naerobe culture (MICRO) - sacrum Bacteria identified in Unspecified specimen by A LOINC Code: 629-4 Convenience Name: Anerobic culture Electronic Signature(s) Signed: 12/10/2019 4:25:30 PM By: Baruch Gouty RN, BSN Signed: 12/12/2019 3:47:20 PM By: Worthy Keeler PA-C Entered By: Baruch Gouty on 12/10/2019 11:58:48 -------------------------------------------------------------------------------- Problem List Details Patient Name: Date of Service: MA [facility], [place]. 12/10/2019 9:00 A M Medical Record Number: [id number] Patient Account Number: [id number] Date of Birth/Sex: Treating RN: 1940/07/10 (78 y.o. Male) Baruch Gouty Primary Care Provider: [name] Other Clinician: Referring Provider: Treating Provider/Extender: [name] in Treatment: 0 Active Problems ICD-10 Encounter Code Description Active Date MDM Diagnosis 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, unstageable  12/10/2019 No Yes L89.520 Pressure ulcer of left ankle, unstageable 12/10/2019 No Yes L89.893 Pressure ulcer of other site, stage 3 12/10/2019 No Yes E11.621 Type 2 diabetes mellitus with foot ulcer 12/10/2019 No Yes L97.518 Non-pressure chronic ulcer of other part of right foot with other specified 12/10/2019 No Yes severity I10 Essential (primary) hypertension 12/10/2019 No Yes M62.81 Muscle weakness (generalized) 12/10/2019 No Yes Inactive Problems Resolved Problems Electronic Signature(s) Signed: 12/10/2019 10:57:31 AM By: Worthy Keeler PA-C Entered By: Worthy Keeler on 12/10/2019 10:57:30 -------------------------------------------------------------------------------- Progress Note Details Patient Name: Date of Service: MA [facility], [place]. 12/10/2019 9:00 A M Medical Record Number: [id number] Patient Account Number: [id number] Date of Birth/Sex: Treating RN: 29-Aug-1940 (78 y.o. Male) Baruch Gouty Primary Care Provider: [name] Other Clinician: Referring Provider: Treating Provider/Extender: [name] in Treatment: 0 Subjective Chief Complaint Information obtained from Patient Sacral, right achilles, left ankle, and right dorsal foot ulcers History of Present Illness (HPI) 12/10/2019 upon evaluation today patient appears for initial evaluation here in our clinic unfortunately due to having multiple pressure injuries noted at this point. Including in the sacral region, left ankle, and Achilles region. He also has an ulcer on top of his foot but this appears to be mainly dry I do not even know that there is really a major issue here but nonetheless we are going to continue to monitor this and to the eschar pops off. He still is having some pain he also had apparently some infection around this area that his primary care provider placed him on antibiotic for. The patient does have hypertension and also muscle weakness that came on all of a sudden at  the beginning of this year around March and unfortunately he has not been able to walk since that time prior to that he was walking with some weakness due to spinal stenosis but nothing like what he is experiencing right now. He does spend a lot of his day sitting though he does reposition in his chair I am not sure if that is sufficient or not we will have to see how things progress. Nonetheless the sacral wound is definitely the worst part of everything that he has at this point. He does have diabetes which is very well controlled most recent hemoglobin A1c was 5.4. Patient History Information obtained  from Patient. Allergies No Known Drug Allergies Family History Unknown History. Social History Former smoker - quit 2009, Marital Status - Married, Alcohol Use - Never, Drug Use - No History, Caffeine Use - Never. Medical History Eyes Patient has history of Glaucoma Respiratory Patient has history of Chronic Obstructive Pulmonary Disease (COPD), Sleep Apnea - cpap Cardiovascular Patient has history of Congestive Heart Failure, Hypertension, Peripheral Arterial Disease Endocrine Patient has history of Type II Diabetes Denies history of Type I Diabetes Musculoskeletal Patient has history of Gout, Osteoarthritis Neurologic Patient has history of Paraplegia Oncologic Patient has history of Received Radiation - 2010 Patient is treated with Controlled Diet. Blood sugar is not tested. Medical A Surgical History Notes nd Gastrointestinal Neurogenic bowel Genitourinary Neurogenic bladder Neurologic Cervical Myelopathy, Spinal stenosis Oncologic Thyroid cancer, status post thyroidectomy 2010 Review of Systems (ROS) Constitutional Symptoms (General Health) Denies complaints or symptoms of Fatigue, Fever, Chills, Marked Weight Change. Eyes Denies complaints or symptoms of Dry Eyes, Vision Changes, Glasses / Contacts. Ear/Nose/Mouth/Throat Denies complaints or symptoms of Chronic  sinus problems or rhinitis. Gastrointestinal Denies complaints or symptoms of Frequent diarrhea, Nausea, Vomiting. Endocrine Denies complaints or symptoms of Heat/cold intolerance. Genitourinary Denies complaints or symptoms of Frequent urination. Integumentary (Skin) Complains or has symptoms of Wounds - sacral and bilateral foot wounds. Psychiatric Denies complaints or symptoms of Claustrophobia, Suicidal. Objective Constitutional sitting or standing blood pressure is within target range for patient.. pulse regular and within target range for patient.[name] respirations regular, non-labored and within target range for patient.[name] temperature within target range for patient.. Well-nourished and well-hydrated in no acute distress. Vitals Time Taken: 9:58 AM, Height: 67 in, Source: Stated, Weight: 211 lbs, Source: Stated, BMI: 33, Temperature: 98.2 F, Pulse: 71 bpm, Respiratory Rate: 18 breaths/min, Blood Pressure: 103/64 mmHg. Eyes conjunctiva clear no eyelid edema noted. pupils equal round and reactive to light and accommodation. Ears, Nose, Mouth, and Throat no gross abnormality of ear auricles or external auditory canals. normal hearing noted during conversation. mucus membranes moist. Respiratory normal breathing without difficulty. Cardiovascular Absent posterior tibial and dorsalis pedis pulses bilateral lower extremities. trace pitting edema of the bilateral lower extremities. Musculoskeletal Patient unable to walk. Psychiatric this patient is able to make decisions and demonstrates good insight into disease process. Alert and Oriented x 3. pleasant and cooperative. General Notes: Upon inspection patient's wounds actually appear to be doing fairly okay in regard to the lower extremities. He in regard to the ankle as well as the Achilles region both appear to be fairly clean with good granulation and overall I think he is doing quite well in that regard. With regard to the dorsal foot  on the right this does not appear to be a major issue he does have some eschar covering and it may pop off eventually I cannot quite tell whether it is really healed underneath or not it hurts and so not can try to force this issue. Integumentary (Hair, Skin) Wound #1 status is Open. Original cause of wound was Pressure Injury. The wound is located on the Sacrum. The wound measures 7cm length x 4cm width x 3.2cm depth; 21.991cm^2 area and 70.372cm^3 volume. There is no tunneling noted, however, there is undermining starting at 9:00 and ending at 12:00 with a maximum distance of 3.4cm. There is a medium amount of serosanguineous drainage noted. Foul odor after cleansing was noted. The wound margin is well defined and not attached to the wound base. There is small (1-33%) red, pink granulation within the  wound bed. There is a large (67-100%) amount of necrotic tissue within the wound bed including Eschar and Adherent Slough. Wound #2 status is Open. Original cause of wound was Pressure Injury. The wound is located on the Left,Lateral Malleolus. The wound measures 0.7cm length x 0.9cm width x 0.1cm depth; 0.495cm^2 area and 0.049cm^3 volume. There is no tunneling or undermining noted. There is a medium amount of serosanguineous drainage noted. The wound margin is flat and intact. There is no granulation within the wound bed. There is a large (67-100%) amount of necrotic tissue within the wound bed including Adherent Slough. Wound #3 status is Open. Original cause of wound was Pressure Injury. The wound is located on the Right Achilles. The wound measures 5cm length x 1cm width x 0.1cm depth; 3.927cm^2 area and 0.393cm^3 volume. There is Fat Layer (Subcutaneous Tissue) Exposed exposed. There is no tunneling or undermining noted. There is a medium amount of serosanguineous drainage noted. The wound margin is flat and intact. There is small (1-33%) red granulation within the wound bed. There is a large  (67-100%) amount of necrotic tissue within the wound bed including Adherent Slough. Wound #4 status is Open. Original cause of wound was Gradually Appeared. The wound is located on the Right,Dorsal Foot. The wound measures 0.4cm length x 1.9cm width x 0.1cm depth; 0.597cm^2 area and 0.06cm^3 volume. There is no tunneling or undermining noted. There is a small amount of serosanguineous drainage noted. The wound margin is flat and intact. There is no granulation within the wound bed. There is a large (67-100%) amount of necrotic tissue within the wound bed including Eschar. 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 There was a Excisional Skin/Subcutaneous Tissue Debridement with a total area of 16 sq cm performed by Worthy Keeler, PA. With the following instrument(s): Blade, Forceps, and Scissors to remove Viable and Non-Viable tissue/material. Material removed includes Eschar, Subcutaneous Tissue, and Slough after achieving pain control using Other (benzocaine 20% spray). 1 specimen was taken by a Swab and sent to the lab per facility protocol. A time out was conducted at 11:10, prior to the start of the procedure. A Minimum amount of bleeding was controlled with Pressure. The procedure was tolerated well with a pain level of 8 throughout and a pain level of 5 following the procedure. Post Debridement Measurements: 7cm length x 4cm width x 3.2cm depth; 70.372cm^3 volume. Post debridement Stage noted as Unstageable/Unclassified. Character of Wound/Ulcer Post Debridement requires further  debridement. Post procedure Diagnosis Wound #1: Same as Pre-Procedure Plan Follow-up Appointments: Return Appointment in 1 week. - allow extra time, need Hoyer Dressing Change Frequency: Wound #1 Sacrum: Change dressing every day. Wound #4 Right,Dorsal Foot: Change dressing every day. Change Dressing every other day. Wound #3 Right Achilles: Change Dressing every other day. Wound Cleansing: Wound #1 Sacrum: Clean wound with Wound Cleanser Wound #2 Left,Lateral Malleolus: May shower and wash wound with soap and water. Wound #3 Right Achilles: May shower and wash wound with soap and water. Wound #4 Right,Dorsal Foot: May shower and wash wound with soap and water. Primary Wound Dressing: Wound #1 Sacrum: Other: - Vashe moistened gauze packing Wound #  2 Left,Lateral Malleolus: Silver Collagen - moisten with saline Wound #3 Right Achilles: Silver Collagen - moisten with saline Other: - paint with betadine Secondary Dressing: Wound #1 Sacrum: ABD pad - or foam border Wound #2 Left,Lateral Malleolus: Foam Border - or large bandaid Wound #3 Right Achilles: Foam Border - or large bandaid Off-Loading: Multipodus Splint to: - or bunny boots to both feet especially when in bed Low air-loss mattress (Group 2) Turn and reposition every 2 hours Laboratory ordered were: Anerobic culture - sacrum 1. I would recommend currently that we go ahead and initiate treatment at this point with silver collagen to the Achilles region as well as the ankle region I think this will do well for both sites based on what I am seeing today. 2. With regard to the sacral region I really think Vashe moistened gauze packing would be the ideal thing here to help clean this area up quite effectively. 3. I would recommend appropriate offloading he does have an air mattress he does need to avoid sitting for long periods of time. He is, likely need to be offloaded more effectively in order to allow this area to  heal. 4. I did perform a debridement today of the sacral region is much as I could the patient does have a lot of discomfort unfortunately. With that being said we were able to clean things up quite a bit enough at least to be able to initiate treatment with the Vashe moistened gauze and I did actually obtain a wound culture today as well. We will see patient back for reevaluation in 1 week here in the clinic. If anything worsens or changes patient will contact our office for additional recommendations. Electronic Signature(s) Signed: 12/10/2019 4:25:30 PM By: Baruch Gouty RN, BSN Signed: 12/12/2019 3:47:20 PM By: Worthy Keeler PA-C Previous Signature: 12/10/2019 11:27:28 AM Version By: Worthy Keeler PA-C Entered By: Baruch Gouty on 12/10/2019 11:30:29 -------------------------------------------------------------------------------- HxROS Details Patient Name: Date of Service: MA [facility], [place] M. 12/10/2019 9:00 A M Medical Record Number: [id number] Patient Account Number: [id number] Date of Birth/Sex: Treating RN: April 26, 1941 (78 y.o. Male) [name] Primary Care Provider: [name] Other Clinician: Referring Provider: Treating Provider/Extender: [name] in Treatment: 0 Information Obtained From Patient Constitutional Symptoms (General Health) Complaints and Symptoms: Negative for: Fatigue; Fever; Chills; Marked Weight Change Eyes Complaints and Symptoms: Negative for: Dry Eyes; Vision Changes; Glasses / Contacts Medical History: Positive for: Glaucoma Ear/Nose/Mouth/Throat Complaints and Symptoms: Negative for: Chronic sinus problems or rhinitis Gastrointestinal Complaints and Symptoms: Negative for: Frequent diarrhea; Nausea; Vomiting Medical History: Past Medical History Notes: Neurogenic bowel Endocrine Complaints and Symptoms: Negative for: Heat/cold intolerance Medical History: Positive for: Type II Diabetes Negative for:  Type I Diabetes Time with diabetes: over 10 years ago Treated with: Diet Blood sugar tested every day: No Genitourinary Complaints and Symptoms: Negative for: Frequent urination Medical History: Past Medical History Notes: Neurogenic bladder Integumentary (Skin) Complaints and Symptoms: Positive for: Wounds - sacral and bilateral foot wounds Psychiatric Complaints and Symptoms: Negative for: Claustrophobia; Suicidal Hematologic/Lymphatic Respiratory Medical History: Positive for: Chronic Obstructive Pulmonary Disease (COPD); Sleep Apnea - cpap Cardiovascular Medical History: Positive for: Congestive Heart Failure; Hypertension; Peripheral Arterial Disease Immunological Musculoskeletal Medical History: Positive for: Gout; Osteoarthritis Neurologic Medical History: Positive for: Paraplegia Past Medical History Notes: Cervical Myelopathy, Spinal stenosis Oncologic Medical History: Positive for: Received Radiation - 2010 Past Medical History Notes: Thyroid cancer, status post thyroidectomy 2010 HBO Extended History Items Eyes:  Glaucoma Immunizations Pneumococcal Vaccine: Received Pneumococcal Vaccination: Yes Implantable Devices None Family and Social History Unknown History: Yes; Former smoker - quit 2009; Marital Status - Married; Alcohol Use: Never; Drug Use: No History; Caffeine Use: Never; Financial Concerns: No; Food, Clothing or Shelter Needs: No; Support System Lacking: No; Transportation Concerns: No Electronic Signature(s) Signed: 12/12/2019 3:47:20 PM By: Worthy Keeler PA-C Signed: 12/12/2019 5:45:26 PM By: Cesar Hurst RN, BSN Entered By: [name] on 12/10/2019 10:33:51 -------------------------------------------------------------------------------- SuperBill Details Patient Name: Date of Service: MA [facility], Ashok Croon M. 12/10/2019 Medical Record Number: [id number] Patient Account Number: [id number] Date of Birth/Sex: Treating RN: 1941-02-07 (78 y.o.  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 I10 Essential (primary) hypertension M62.81 Muscle weakness (generalized) Facility Procedures CPT4 Code: 81771165 Description: Graham VISIT-LEV 3 EST PT Modifier: 25 Quantity: 1 CPT4 Code: [id number] Description: 83291 - DEB SUBQ TISSUE 20 SQ CM/< ICD-10 Diagnosis Description 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, unstageable Modifier: Quantity: 1 Physician Procedures : CPT4 Code Description Modifier [id number] 99214 - WC PHYS LEVEL 4 - EST PT 25 ICD-10 Diagnosis Description 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

## 2019-12-12 LAB — AEROBIC CULTURE W GRAM STAIN (SUPERFICIAL SPECIMEN): Gram Stain: NONE SEEN

## 2019-12-12 NOTE — Progress Notes (Signed)
[name], [name] ([id number]) Visit Report for 12/10/2019 Allergy List Details Patient Name: Date of Service: [name] 12/10/2019 9:00 A M Medical Record Number: [id number] Patient Account Number: [id number] Date of Birth/Sex: Treating RN: 14-Jul-1940 (78 y.o. Male) [name] Primary Care Shani Fitch: [name] Other Clinician: Referring Mickaela Starlin: Treating Susi Goslin/Extender: [name] in Treatment: 0 Allergies Active Allergies No Known Drug Allergies Allergy Notes Electronic Signature(s) Signed: 12/12/2019 5:45:26 PM By: [name] RN, BSN Entered By: [name] on 12/10/2019 10:24:50 -------------------------------------------------------------------------------- Arrival Information Details Patient Name: Date of Service: MA [facility], [place]. 12/10/2019 9:00 A M Medical Record Number: [id number] Patient Account Number: [id number] Date of Birth/Sex: Treating RN: 09-04-1940 (78 y.o. Male) Baruch Gouty Primary Care Jaydalyn Demattia: [name] Other Clinician: Referring Narcisa Ganesh: Treating Marilla Boddy/Extender: [name] in Treatment: 0 Visit Information Patient Arrived: Wheel Chair Arrival Time: 09:58 Accompanied By: wife Transfer Assistance: Civil Service fast streamer Patient Identification Verified: Yes Secondary Verification Process Completed: Yes Patient Has Alerts: Yes Patient Alerts: R ABI: 0.89 TBI: 0.64 L ABI: 0.62 (01/2019) Electronic Signature(s) Signed: 12/12/2019 5:45:26 PM By: [name] RN, BSN Entered By: [name] on 12/10/2019 11:32:17 -------------------------------------------------------------------------------- Clinic Level of Care Assessment Details Patient Name: Date of Service: MA [facility], [place] M. 12/10/2019 9:00 A M Medical Record Number: [id number] Patient Account Number: [id number] Date of Birth/Sex: Treating RN: December 21, 1940 (78 y.o. Male) Baruch Gouty Primary Care Bronnie Vasseur: [name] Other Clinician: Referring Itzamara Casas: Treating Saket Hellstrom/Extender: [name] in Treatment: 0 Clinic Level of Care Assessment Items TOOL 1 Quantity Score []  - 0 Use when EandM and Procedure is performed on INITIAL visit ASSESSMENTS - Nursing Assessment / Reassessment X- 1 20 General Physical Exam (combine w/ comprehensive assessment (listed just below) when performed on new pt. evals) X- 1 25 Comprehensive Assessment (HX, ROS, Risk Assessments, Wounds Hx, etc.) ASSESSMENTS - Wound and Skin Assessment / Reassessment []  - 0 Dermatologic / Skin Assessment (not related to wound area) ASSESSMENTS - Ostomy and/or Continence Assessment and Care []  - 0 Incontinence Assessment and Management []  - 0 Ostomy Care Assessment and Management (repouching, etc.) PROCESS - Coordination of Care X - Simple Patient / Family Education for ongoing care 1 15 []  - 0 Complex (extensive) Patient / Family Education for ongoing care X- 1 10 Staff obtains Programmer, systems, Records, T Results / Process Orders est []  - 0 Staff telephones HHA, Nursing Homes / Clarify orders / etc []  - 0 Routine Transfer to another Facility (non-emergent condition) []  - 0 Routine Hospital Admission (non-emergent condition) X- 1 15 New Admissions / Biomedical engineer / Ordering NPWT Apligraf, etc. , []  - 0 Emergency Hospital Admission (emergent condition) PROCESS - Special Needs []  - 0 Pediatric / Minor Patient Management []  - 0 Isolation Patient Management []  - 0 Hearing / Language / Visual special needs []  - 0 Assessment of Community assistance (transportation, D/C planning, etc.) []  - 0 Additional assistance / Altered mentation []  - 0 Support Surface(s) Assessment (bed, cushion, seat, etc.) INTERVENTIONS - Miscellaneous []  - 0 External ear exam X- 1 10 Patient Transfer (multiple staff / Civil Service fast streamer / Similar devices) []  - 0 Simple Staple / Suture removal (25 or less) []  -  0 Complex Staple / Suture removal (26 or more) []  - 0 Hypo/Hyperglycemic Management (do not check if billed separately) []  - 0 Ankle / Brachial Index (ABI) - do not check if billed separately Has the patient been seen  at the hospital within the last three years: Yes Total Score: 95 Level Of Care: New/Established - Level 3 Electronic Signature(s) Signed: 12/10/2019 4:25:30 PM By: Baruch Gouty RN, BSN Entered By: Baruch Gouty on 12/10/2019 11:18:48 -------------------------------------------------------------------------------- Encounter Discharge Information Details Patient Name: Date of Service: MA [facility], [place] HN M. 12/10/2019 9:00 A M Medical Record Number: [id number] Patient Account Number: [id number] Date of Birth/Sex: Treating RN: 1941-05-31 (78 y.o. Male) Carlene Coria Primary Care Ilena Dieckman: [name] Other Clinician: Referring Chieko Neises: Treating Clemie General/Extender: [name] in Treatment: 0 Encounter Discharge Information Items Post Procedure Vitals Discharge Condition: Stable Temperature (F): 98.2 Ambulatory Status: Wheelchair Pulse (bpm): 71 Discharge Destination: Home Respiratory Rate (breaths/min): 18 Transportation: Private Auto Blood Pressure (mmHg): 103/64 Accompanied By: wife Schedule Follow-up Appointment: Yes Clinical Summary of Care: Patient Declined Electronic Signature(s) Signed: 12/10/2019 4:51:47 PM By: Carlene Coria RN Entered By: Carlene Coria on 12/10/2019 11:57:02 -------------------------------------------------------------------------------- Lower Extremity Assessment Details Patient Name: Date of Service: MA [name] M. 12/10/2019 9:00 A M Medical Record Number: [id number] Patient Account Number: [id number] Date of Birth/Sex: Treating RN: 1941-04-23 (78 y.o. Male) [name] Primary Care Blayde Bacigalupi: [name] Other Clinician: Referring Jerimah Witucki: Treating Delbert Vu/Extender: [name] in Treatment: 0 Edema Assessment Assessed: [Left: No] [Right: No] Edema: [Left: No] [Right: No] Calf Left: Right: Point of Measurement: 28 cm From Medial Instep 21 cm 33.8 cm Ankle Left: Right: Point of Measurement: 10 cm From Medial Instep 31 cm 21.8 cm Vascular Assessment Pulses: Dorsalis Pedis Palpable: [Left:No] [Right:No] Electronic Signature(s) Signed: 12/12/2019 5:45:26 PM By: [name] RN, BSN Entered By: [name] on 12/10/2019 10:43:31 -------------------------------------------------------------------------------- [place] Details Patient Name: Date of Service: MA [facility], [place] HN M. 12/10/2019 9:00 A M Medical Record Number: [id number] Patient Account Number: [id number] Date of Birth/Sex: Treating RN: 08-May-1941 (78 y.o. Male) Baruch Gouty Primary Care Rakisha Pincock: [name] Other Clinician: Referring Elbert Spickler: Treating Jenisse Vullo/Extender: [name] in Treatment: 0 Active Inactive Abuse / Safety / Falls / Self Care Management Nursing Diagnoses: Potential for falls Goals: Patient/caregiver will verbalize/demonstrate measures taken to prevent injury and/or falls Date Initiated: 12/10/2019 Target Resolution Date: 01/07/2020 Goal Status: Active Interventions: Assess fall risk on admission and as needed Assess: immobility, friction, shearing, incontinence upon admission and as needed Notes: Nutrition Nursing Diagnoses: Potential for alteratiion in Nutrition/Potential for imbalanced nutrition Goals: Patient/caregiver will maintain therapeutic glucose control Date Initiated: 12/10/2019 Target Resolution Date: 01/07/2020 Goal Status: Active Interventions: Assess HgA1c results as ordered upon admission and as needed Provide education on elevated blood sugars and impact on wound healing Treatment Activities: Patient referred to Primary Care Physician for further nutritional evaluation :  12/10/2019 Notes: Pressure Nursing Diagnoses: Knowledge deficit related to management of pressures ulcers Potential for impaired tissue integrity related to pressure, friction, moisture, and shear Goals: Patient/caregiver will verbalize understanding of pressure ulcer management Date Initiated: 12/10/2019 Target Resolution Date: 01/07/2020 Goal Status: Active Interventions: Assess: immobility, friction, shearing, incontinence upon admission and as needed Assess offloading mechanisms upon admission and as needed Assess potential for pressure ulcer upon admission and as needed Notes: Wound/Skin Impairment Nursing Diagnoses: Impaired tissue integrity Knowledge deficit related to ulceration/compromised skin integrity Goals: Patient/caregiver will verbalize understanding of skin care regimen Date Initiated: 12/10/2019 Target Resolution Date: 01/07/2020 Goal Status: Active Ulcer/skin breakdown will have a volume reduction of 30% by week 4 Date Initiated: 12/10/2019 Target Resolution Date: 01/14/2020 Goal Status: Active Interventions: Assess patient/caregiver ability to obtain necessary  supplies Assess patient/caregiver ability to perform ulcer/skin care regimen upon admission and as needed Assess ulceration(s) every visit Provide education on ulcer and skin care Treatment Activities: Skin care regimen initiated : 12/10/2019 Topical wound management initiated : 12/10/2019 Notes: Electronic Signature(s) Signed: 12/10/2019 4:25:30 PM By: Baruch Gouty RN, BSN Entered By: Baruch Gouty on 12/10/2019 11:17:38 -------------------------------------------------------------------------------- Pain Assessment Details Patient Name: Date of Service: MA [facility], [place] M. 12/10/2019 9:00 A M Medical Record Number: [id number] Patient Account Number: [id number] Date of Birth/Sex: Treating RN: February 11, 1941 (78 y.o. Male) [name] Primary Care Hannie Shoe: [name] Other Clinician: Referring  Zong Mcquarrie: Treating Nyair Depaulo/Extender: [name] in Treatment: 0 Active Problems Location of Pain Severity and Description of Pain Patient Has Paino Yes Site Locations Pain Location: Generalized Pain With Dressing Change: Yes Rate the pain. Current Pain Level: 6 Worst Pain Level: 8 Least Pain Level: 3 Character of Pain Describe the Pain: Throbbing Pain Management and Medication Current Pain Management: Medication: Yes Cold Application: No Rest: No Massage: No Activity: No T.E.N.S.: No Heat Application: No Leg drop or elevation: No Is the Current Pain Management Adequate: Adequate How does your wound impact your activities of daily livingo Sleep: No Bathing: No Appetite: No Relationship With Others: No Bladder Continence: No Emotions: No Bowel Continence: No Work: No Toileting: No Drive: No Dressing: No Hobbies: No Electronic Signature(s) Signed: 12/12/2019 5:45:26 PM By: [name] RN, BSN Entered By: [name] on 12/10/2019 10:50:16 -------------------------------------------------------------------------------- Patient/Caregiver Education Details Patient Name: Date of Service: MA [facility], Ashok Croon M. 6/23/2021andnbsp9:00 A M Medical Record Number: [id number] Patient Account Number: [id number] Date of Birth/Gender: Treating RN: [birth year] (78 y.o. Male) Baruch Gouty Primary Care Physician: [name] Other Clinician: Referring Physician: Treating Physician/Extender: [name] in Treatment: 0 Education Assessment Education Provided To: Patient Education Topics Provided Pressure: Handouts: Pressure Ulcers: Care and Offloading, Pressure Ulcers: Care and Offloading 2 Methods: Explain/Verbal, Printed Responses: Reinforcements needed, State content correctly Wound/Skin Impairment: Handouts: Caring for Your Ulcer, Skin Care Do's and Dont's Methods: Explain/Verbal Responses: Reinforcements  needed, State content correctly Electronic Signature(s) Signed: 12/10/2019 4:25:30 PM By: Baruch Gouty RN, BSN Entered By: Baruch Gouty on 12/10/2019 11:18:15 -------------------------------------------------------------------------------- Wound Assessment Details Patient Name: Date of Service: MA [facility], [place] HN M. 12/10/2019 9:00 A M Medical Record Number: [id number] Patient Account Number: [id number] Date of Birth/Sex: Treating RN: 04/10/1941 (78 y.o. Male) [name] Primary Care Lorrin Bodner: [name] Other Clinician: Referring Tevan Marian: Treating Neida Ellegood/Extender: [name] in Treatment: 0 Wound Status Wound Number: 1 Primary Pressure Ulcer Etiology: Wound Location: Sacrum Wound Open Wounding Event: Pressure Injury Status: Status: Date Acquired: 09/18/2019 Comorbid Glaucoma, Chronic Obstructive Pulmonary Disease (COPD), Sleep Weeks Of Treatment: 0 History: Apnea, Congestive Heart Failure, Hypertension, Peripheral Arterial Clustered Wound: No Disease, Type II Diabetes, Gout, Osteoarthritis, Paraplegia, Received Radiation Wound Measurements Length: (cm) 7 Width: (cm) 4 Depth: (cm) 3.2 Area: (cm) 21.991 Volume: (cm) 70.372 % Reduction in Area: % Reduction in Volume: Epithelialization: None Tunneling: No Undermining: Yes Starting Position (o'clock): 9 Ending Position (o'clock): 12 Maximum Distance: (cm) 3.4 Wound Description Classification: Unstageable/Unclassified Wound Margin: Well defined, not attached Exudate Amount: Medium Exudate Type: Serosanguineous Exudate Color: red, brown Foul Odor After Cleansing: Yes Due to Product Use: No Slough/Fibrino Yes Wound Bed Granulation Amount: Small (1-33%) Exposed Structure Granulation Quality: Red, Pink Fascia Exposed: No Necrotic Amount: Large (67-100%) Fat Layer (Subcutaneous Tissue) Exposed: No Necrotic Quality: Eschar, Adherent Slough Tendon Exposed: No Muscle  Exposed:  No Joint Exposed: No Bone Exposed: No Treatment Notes Wound #1 (Sacrum) 1. Cleanse With Wound Cleanser 3. Primary Dressing Applied Other primary dressing (specifiy in notes) 4. Secondary Dressing Foam Border Dressing Notes packed with wet to dry Electronic Signature(s) Signed: 12/12/2019 5:45:26 PM By: [name] RN, BSN Entered By: [name] on 12/10/2019 10:44:54 -------------------------------------------------------------------------------- Wound Assessment Details Patient Name: Date of Service: MA [facility], [place] HN M. 12/10/2019 9:00 A M Medical Record Number: [id number] Patient Account Number: [id number] Date of Birth/Sex: Treating RN: [birth year] (78 y.o. Male) [name] Primary Care Ettel Albergo: [name] Other Clinician: Referring Thy Gullikson: Treating Marguerette Sheller/Extender: [name] in Treatment: 0 Wound Status Wound Number: 2 Primary Pressure Ulcer Etiology: Wound Location: Left, Lateral Malleolus Secondary Diabetic Wound/Ulcer of the Lower Extremity Wounding Event: Pressure Injury Etiology: Date Acquired: 11/17/2019 Wound Open Weeks Of Treatment: 0 Status: Status: Clustered Wound: No Comorbid Glaucoma, Chronic Obstructive Pulmonary Disease (COPD), History: Sleep Apnea, Congestive Heart Failure, Hypertension, Peripheral Arterial Disease, Type II Diabetes, Gout, Osteoarthritis, Paraplegia, Received Radiation Photos Wound Measurements Length: (cm) 0.7 Width: (cm) 0.9 Depth: (cm) 0.1 Area: (cm) 0.495 Volume: (cm) 0.049 % Reduction in Area: 0% % Reduction in Volume: 0% Epithelialization: None Tunneling: No Undermining: No Wound Description Classification: Unstageable/Unclassified Wound Margin: Flat and Intact Exudate Amount: Medium Exudate Type: Serosanguineous Exudate Color: red, brown Foul Odor After Cleansing: No Slough/Fibrino Yes Wound Bed Granulation Amount: None Present (0%) Exposed Structure Necrotic  Amount: Large (67-100%) Fascia Exposed: No Necrotic Quality: Adherent Slough Fat Layer (Subcutaneous Tissue) Exposed: No Tendon Exposed: No Muscle Exposed: No Joint Exposed: No Bone Exposed: No Treatment Notes Wound #2 (Left, Lateral Malleolus) 1. Cleanse With Wound Cleanser 3. Primary Dressing Applied [employer] 4. Secondary Dressing Foam Border Dressing Electronic Signature(s) Signed: 12/12/2019 5:12:49 PM By: Minerva Fester Signed: 12/12/2019 5:45:26 PM By: [name] RN, BSN Entered By: Minerva Fester on 12/11/2019 13:57:18 -------------------------------------------------------------------------------- Wound Assessment Details Patient Name: Date of Service: MA [facility], [place] M. 12/10/2019 9:00 A M Medical Record Number: [id number] Patient Account Number: [id number] Date of Birth/Sex: Treating RN: 1941-02-07 (78 y.o. Male) [name] Primary Care Santresa Levett: [name] Other Clinician: Referring Naylin Burkle: Treating Martie Fulgham/Extender: [name] in Treatment: 0 Wound Status Wound Number: 3 Primary Pressure Ulcer Etiology: Wound Location: Right Achilles Secondary Diabetic Wound/Ulcer of the Lower Extremity Wounding Event: Pressure Injury Etiology: Date Acquired: 09/18/2019 Wound Open Weeks Of Treatment: 0 Status: Clustered Wound: No Comorbid Glaucoma, Chronic Obstructive Pulmonary Disease (COPD), History: Sleep Apnea, Congestive Heart Failure, Hypertension, Peripheral Arterial Disease, Type II Diabetes, Gout, Osteoarthritis, Paraplegia, Received Radiation Photos Wound Measurements Length: (cm) 5 Width: (cm) 1 Depth: (cm) 0.1 Area: (cm) 3.927 Volume: (cm) 0.393 % Reduction in Area: 0% % Reduction in Volume: 0% Epithelialization: None Tunneling: No Undermining: No Wound Description Classification: Category/Stage III Wound Margin: Flat and Intact Exudate Amount: Medium Exudate Type: Serosanguineous Exudate Color: red,  brown Foul Odor After Cleansing: No Slough/Fibrino Yes Wound Bed Granulation Amount: Small (1-33%) Exposed Structure Granulation Quality: Red Fascia Exposed: No Necrotic Amount: Large (67-100%) Fat Layer (Subcutaneous Tissue) Exposed: Yes Necrotic Quality: Adherent Slough Tendon Exposed: No Muscle Exposed: No Joint Exposed: No Bone Exposed: No Treatment Notes Wound #3 (Right Achilles) 1. Cleanse With Wound Cleanser 3. Primary Dressing Applied [employer] Other primary dressing (specifiy in notes) 4. Secondary Dressing Foam Border Dressing Notes paint with iodine Electronic Signature(s) Signed: 12/12/2019 5:12:49 PM By: Minerva Fester Signed: 12/12/2019 5:45:26 PM By: [name]  RN, BSN Entered By: Minerva Fester on 12/11/2019 13:55:40 -------------------------------------------------------------------------------- Wound Assessment Details Patient Name: Date of Service: MA [facility] M. 12/10/2019 9:00 A M Medical Record Number: [id number] Patient Account Number: [id number] Date of Birth/Sex: Treating RN: Mar 01, 1941 (78 y.o. Male) [name] Primary Care Kaylina Cahue: [name] Other Clinician: Referring Silver Achey: Treating Davaun Quintela/Extender: [name] in Treatment: 0 Wound Status Wound Number: 4 Primary Diabetic Wound/Ulcer of the Lower Extremity Etiology: Wound Location: Right, Dorsal Foot Wound Open Wounding Event: Gradually Appeared Status: Date Acquired: 11/18/2019 Comorbid Glaucoma, Chronic Obstructive Pulmonary Disease (COPD), Weeks Of Treatment: 0 History: Sleep Apnea, Congestive Heart Failure, Hypertension, Peripheral Clustered Wound: No Arterial Disease, Type II Diabetes, Gout, Osteoarthritis, Paraplegia, Received Radiation Photos Wound Measurements Length: (cm) 0.4 % Width: (cm) 1.9 % Depth: (cm) 0.1 E Area: (cm) 0.597 Volume: (cm) 0.06 Reduction in Area: 0% Reduction in Volume: 0% pithelialization:  None Tunneling: No Undermining: No Wound Description Classification: Unable to visualize wound bed F Wound Margin: Flat and Intact S Exudate Amount: Small Exudate Type: Serosanguineous Exudate Color: red, brown oul Odor After Cleansing: No lough/Fibrino No Wound Bed Granulation Amount: None Present (0%) Exposed Structure Necrotic Amount: Large (67-100%) Fascia Exposed: No Necrotic Quality: Eschar Fat Layer (Subcutaneous Tissue) Exposed: No Tendon Exposed: No Muscle Exposed: No Joint Exposed: No Bone Exposed: No Treatment Notes Wound #4 (Right, Dorsal Foot) 1. Cleanse With Wound Cleanser 3. Primary Dressing Applied Other primary dressing (specifiy in notes) Notes paint with betadine Electronic Signature(s) Signed: 12/12/2019 5:12:49 PM By: Minerva Fester Signed: 12/12/2019 5:45:26 PM By: [name] RN, BSN Entered By: Minerva Fester on 12/11/2019 13:56:57 -------------------------------------------------------------------------------- [place] Details Patient Name: Date of Service: MA [place], [place] M. 12/10/2019 9:00 A M Medical Record Number: [id number] Patient Account Number: [id number] Date of Birth/Sex: Treating RN: 22-Oct-1940 (78 y.o. Male) Baruch Gouty Primary Care Marcele Kosta: [name] Other Clinician: Referring Geanna Divirgilio: Treating Keyira Mondesir/Extender: [name] in Treatment: 0 Vital Signs Time Taken: 09:58 Temperature (F): 98.2 Height (in): 67 Pulse (bpm): 71 Source: Stated Respiratory Rate (breaths/min): 18 Weight (lbs): 211 Blood Pressure (mmHg): 103/64 Source: Stated Reference Range: 80 - 120 mg / dl Body Mass Index (BMI): 33 Electronic Signature(s) Signed: 12/11/2019 8:32:44 AM By: Sandre Kitty Entered By: Sandre Kitty on 12/10/2019 09:59:16

## 2019-12-12 NOTE — Progress Notes (Signed)
[name], [name] ([id number]) Visit Report for 12/10/2019 Abuse/Suicide Risk Screen Details Patient Name: Date of Service: [name] 12/10/2019 9:00 A M Medical Record Number: [id number] Patient Account Number: [id number] Date of Birth/Sex: Treating RN: 23-Apr-1941 (79 y.o. Male) [name] Primary Care Aedin Jeansonne: [name] Other Clinician: Referring Haylea Schlichting: Treating Demonta Wombles/Extender: [name] in Treatment: 0 Abuse/Suicide Risk Screen Items Answer ABUSE RISK SCREEN: Has anyone close to you tried to hurt or harm you recentlyo No Do you feel uncomfortable with anyone in your familyo No Has anyone forced you do things that you didnt want to doo No Electronic Signature(s) Signed: 12/12/2019 5:45:26 PM By: [name] RN, BSN Entered By: [name] on 12/10/2019 10:33:57 -------------------------------------------------------------------------------- Activities of Daily Living Details Patient Name: Date of Service: [name], [name] 12/10/2019 9:00 A M Medical Record Number: [id number] Patient Account Number: [id number] Date of Birth/Sex: Treating RN: 05-03-1941 (79 y.o. Male) [name] Primary Care Rayford Williamsen: [name] Other Clinician: Referring Breyona Swander: Treating Jamelah Sitzer/Extender: [name] in Treatment: 0 Activities of Daily Living Items Answer Activities of Daily Living (Please select one for each item) Drive Automobile Not Able T Medications ake Need Assistance Use T elephone Need Assistance Care for Appearance Need Assistance Use T oilet Need Assistance Bath / Shower Need Assistance Dress Self Need Assistance Feed Self Completely Able Walk Need Assistance Get In / Out Bed Need Assistance Housework Need Assistance Prepare Meals Need Assistance Handle Money Need Assistance Shop for Self Need Assistance Electronic Signature(s) Signed: 12/12/2019 5:45:26 PM By: [name] RN,  BSN Entered By: [name] on 12/10/2019 10:34:43 -------------------------------------------------------------------------------- Education Screening Details Patient Name: Date of Service: [name], [name] 12/10/2019 9:00 A M Medical Record Number: [id number] Patient Account Number: [id number] Date of Birth/Sex: Treating RN: 14-Oct-1940 (78 y.o. Male) [name] Primary Care Axzel Rockhill: [name] Other Clinician: Referring Joram Venson: Treating Andra Heslin/Extender: [name] in Treatment: 0 Primary Learner Assessed: Patient Learning Preferences/Education Level/Primary Language Learning Preference: Explanation, Demonstration, Printed Material Highest Education Level: College or Above Preferred Language: English Cognitive Barrier Language Barrier: No Translator Needed: No Memory Deficit: No Emotional Barrier: No Cultural/Religious Beliefs Affecting Medical Care: No Physical Barrier Impaired Vision: No Impaired Hearing: No Decreased Hand dexterity: No Knowledge/Comprehension Knowledge Level: High Comprehension Level: High Ability to understand written instructions: High Ability to understand verbal instructions: High Motivation Anxiety Level: Calm Cooperation: Cooperative Education Importance: Acknowledges Need Interest in Health Problems: Asks Questions Perception: Coherent Willingness to Engage in Self-Management High Activities: Readiness to Engage in Self-Management High Activities: Electronic Signature(s) Signed: 12/12/2019 5:45:26 PM By: [name] RN, BSN Entered By: [name] on 12/10/2019 10:35:00 -------------------------------------------------------------------------------- Fall Risk Assessment Details Patient Name: Date of Service: [name], [name] 12/10/2019 9:00 [place] Record Number: [id number] Patient Account Number: [id number] Date of Birth/Sex: Treating RN: 1940/09/04 (79 y.o. Male) [name] Primary Care Amerigo Mcglory: [name] Other Clinician: Referring Jacquel Mccamish: Treating Rishawn Walck/Extender: [name] in Treatment: 0 Fall Risk Assessment Items Have you had 2 or more falls in the last 12 monthso 0 Yes Have you had any fall that resulted in injury in the last 12 monthso 0 No FALLS RISK SCREEN History of falling - immediate or within 3 months 25 Yes Secondary diagnosis (Do you have 2 or more medical diagnoseso) 0 No Ambulatory aid None/bed rest/wheelchair/nurse 0 Yes Crutches/cane/walker 0 No Furniture 0 No Intravenous therapy Access/Saline/Heparin [employer]  0 No Gait/Transferring Normal/ bed rest/ wheelchair 0 Yes Weak (short steps with or without shuffle, stooped but able to lift head while walking, may seek 0 No support from furniture) Impaired (short steps with shuffle, may have difficulty arising from chair, head down, impaired 0 No balance) Mental Status Oriented to own ability 0 Yes Electronic Signature(s) Signed: 12/12/2019 5:45:26 PM By: [name] RN, BSN Entered By: [name] on 12/10/2019 10:35:25 -------------------------------------------------------------------------------- Foot Assessment Details Patient Name: Date of Service: MA [facility], [place] M. 12/10/2019 9:00 A M Medical Record Number: [id number] Patient Account Number: [id number] Date of Birth/Sex: Treating RN: 02/15/1941 (78 y.o. Male) [name] Primary Care Larose Batres: [name] Other Clinician: Referring Ireta Pullman: Treating [name]/Extender: [name] in Treatment: 0 Foot Assessment Items Site Locations + = Sensation present, - = Sensation absent, C = Callus, U = Ulcer R = Redness, W = Warmth, M = Maceration, PU = Pre-ulcerative lesion F = Fissure, S = Swelling, D = Dryness Assessment Right: Left: Other Deformity: No No Prior Foot Ulcer: No No Prior Amputation: No No Charcot Joint: No No Ambulatory  Status: Non-ambulatory Assistance Device: Wheelchair Gait: Administrator, arts) Signed: 12/12/2019 5:45:26 PM By: [name] RN, BSN Entered By: [name] on 12/10/2019 10:36:39 -------------------------------------------------------------------------------- Nutrition Risk Screening Details Patient Name: Date of Service: MA [facility], [place] M. 12/10/2019 9:00 A M Medical Record Number: [id number] Patient Account Number: [id number] Date of Birth/Sex: Treating RN: August 13, 1940 (78 y.o. Male) [name] Primary Care Zacary Bauer: [name] Other Clinician: Referring Kayda Allers: Treating Laycee Fitzsimmons/Extender: [name] in Treatment: 0 Height (in): 67 Weight (lbs): 211 Body Mass Index (BMI): 33 Nutrition Risk Screening Items Score Screening NUTRITION RISK SCREEN: I have an illness or condition that made me change the kind and/or amount of food I eat 2 Yes I eat fewer than two meals per day 0 No I eat few fruits and vegetables, or milk products 0 No I have three or more drinks of beer, liquor or wine almost every day 0 No I have tooth or mouth problems that make it hard for me to eat 0 No I don't always have enough money to buy the food I need 0 No I eat alone most of the time 0 No I take three or more different prescribed or over-the-counter drugs a day 1 Yes Without wanting to, I have lost or gained 10 pounds in the last six months 0 No I am not always physically able to shop, cook and/or feed myself 2 Yes Nutrition Protocols Good Risk Protocol Moderate Risk Protocol 0 Provide education on nutrition High Risk Proctocol Risk Level: Moderate Risk Score: 5 Electronic Signature(s) Signed: 12/12/2019 5:45:26 PM By: [name] RN, BSN Entered By: [name] on 12/10/2019 10:35:38

## 2019-12-15 ENCOUNTER — Encounter (HOSPITAL_COMMUNITY): Payer: Self-pay

## 2019-12-15 ENCOUNTER — Emergency Department (HOSPITAL_COMMUNITY)
Admission: EM | Admit: 2019-12-15 | Discharge: 2019-12-15 | Disposition: A | Discharge status: Home / Self Care | Payer: Medicare Other | Attending: Emergency Medicine | Admitting: Emergency Medicine

## 2019-12-15 ENCOUNTER — Other Ambulatory Visit: Payer: Self-pay

## 2019-12-15 DIAGNOSIS — Z87891 Personal history of nicotine dependence: Secondary | ICD-10-CM | POA: Insufficient documentation

## 2019-12-15 DIAGNOSIS — R339 Retention of urine, unspecified: Secondary | ICD-10-CM | POA: Diagnosis present

## 2019-12-15 DIAGNOSIS — J449 Chronic obstructive pulmonary disease, unspecified: Secondary | ICD-10-CM | POA: Insufficient documentation

## 2019-12-15 DIAGNOSIS — Z7982 Long term (current) use of aspirin: Secondary | ICD-10-CM | POA: Insufficient documentation

## 2019-12-15 DIAGNOSIS — E039 Hypothyroidism, unspecified: Secondary | ICD-10-CM | POA: Insufficient documentation

## 2019-12-15 DIAGNOSIS — T83028A Displacement of other indwelling urethral catheter, initial encounter: Secondary | ICD-10-CM | POA: Insufficient documentation

## 2019-12-15 DIAGNOSIS — T839XXA Unspecified complication of genitourinary prosthetic device, implant and graft, initial encounter: Secondary | ICD-10-CM

## 2019-12-15 DIAGNOSIS — R531 Weakness: Secondary | ICD-10-CM | POA: Insufficient documentation

## 2019-12-15 DIAGNOSIS — Z79899 Other long term (current) drug therapy: Secondary | ICD-10-CM | POA: Diagnosis not present

## 2019-12-15 DIAGNOSIS — N390 Urinary tract infection, site not specified: Secondary | ICD-10-CM

## 2019-12-15 DIAGNOSIS — Z96652 Presence of left artificial knee joint: Secondary | ICD-10-CM | POA: Insufficient documentation

## 2019-12-15 LAB — URINALYSIS, ROUTINE W REFLEX MICROSCOPIC
Bilirubin Urine: NEGATIVE
Glucose, UA: NEGATIVE mg/dL
Ketones, ur: NEGATIVE mg/dL
Nitrite: POSITIVE — AB
Protein, ur: 100 mg/dL — AB
RBC / HPF: >50 RBC/hpf — ABNORMAL HIGH (ref 0–5)
Specific Gravity, Urine: 1.018 (ref 1.005–1.030)
WBC, UA: >50 WBC/hpf — ABNORMAL HIGH (ref 0–5)
pH: 5 (ref 5.0–8.0)

## 2019-12-15 LAB — CBC
HCT: 36 % — ABNORMAL LOW (ref 39.0–52.0)
Hemoglobin: 11.6 g/dL — ABNORMAL LOW (ref 13.0–17.0)
MCH: 28.1 pg (ref 26.0–34.0)
MCHC: 32.2 g/dL (ref 30.0–36.0)
MCV: 87.2 fL (ref 80.0–100.0)
Platelets: 302 10*3/uL (ref 150–400)
RBC: 4.13 MIL/uL — ABNORMAL LOW (ref 4.22–5.81)
RDW: 15.2 % (ref 11.5–15.5)
WBC: 14.3 10*3/uL — ABNORMAL HIGH (ref 4.0–10.5)
nRBC: 0 % (ref 0.0–0.2)

## 2019-12-15 LAB — BASIC METABOLIC PANEL
Anion gap: 11 (ref 5–15)
BUN: 28 mg/dL — ABNORMAL HIGH (ref 8–23)
CO2: 28 mmol/L (ref 22–32)
Calcium: 9 mg/dL (ref 8.9–10.3)
Chloride: 99 mmol/L (ref 98–111)
Creatinine, Ser: 0.84 mg/dL (ref 0.61–1.24)
GFR calc Af Amer: >60 mL/min (ref >60)
GFR calc non Af Amer: >60 mL/min (ref >60)
Glucose, Bld: 110 mg/dL — ABNORMAL HIGH (ref 70–99)
Potassium: 4.2 mmol/L (ref 3.5–5.1)
Sodium: 138 mmol/L (ref 135–145)

## 2019-12-15 MED ORDER — CEPHALEXIN 500 MG PO CAPS
500.0000 mg | ORAL_CAPSULE | Freq: Two times a day (BID) | ORAL | 0 refills | Status: AC
Start: 2019-12-15 — End: 2019-12-25

## 2019-12-15 NOTE — Discharge Instructions (Addendum)
Return for fevers, chills, vomiting.  Take the abx until finished. Call the urologist to schedule an appointment regarding your foley catheter

## 2019-12-15 NOTE — ED Triage Notes (Signed)
EMS reports from home, states sometime last night dislodged urinary catheter and states retention and dysuria since.  BP 132/66 HR 78 RR 16 Sp02 94 RA

## 2019-12-15 NOTE — ED Provider Notes (Signed)
[facility] DEPT Provider Note   CSN: [id number] Arrival date & time: 12/15/19  4709     History Chief Complaint  Patient presents with  . Urinary Retention  . Urinary Catheter problem    [name] is a 79 y.o. male.  HPI   Pt has multiple medical problems.  Admitted to the hospital in May for issues with increased weakness.  Patient was treated for nerve root impingement causing incomplete paraplegia and worsening spasticity.  Then went to rehab and released a couple of weeks ago to home.   Pt presents with inability to urinate.  He left the nursing facility with a foley catheter.  he felt like he had to urinate this am but could not.  The caregiver noted that the catheter had fallen out.  No vomiting or diarrhea.  No fever.  Pt does have pain and fullness in the lower abdomen.    Past Medical History:  Diagnosis Date  . Arthritis   . Barrett esophagus   . Bronchitis   . Carotid artery occlusion   . COPD (chronic obstructive pulmonary disease) ([place])   . Diabetes mellitus without complication ([place])   . GERD (gastroesophageal reflux disease)   . Headache    migraines  . History of thyroid cancer 2008  . Hypertension   . Restless leg   . Sleep apnea with use of continuous positive airway pressure (CPAP)    2011 [facility] , AHI  77cn central and obstrcutive. 16 cm water , 3 cm EPR.   . Thoracic ascending aortic aneurysm (HCC)    4.2 cm ascending TAA 08/18/17 CTA. Annual imaging recommended.  . thyroid ca dx'd 2009 or 2010   surg and radioactive isoptope  . Ulcer    Peptic ulcer disease    Patient Active Problem List   Diagnosis Date Noted  . Hypothyroidism   . Palliative care by specialist   . DNR (do not resuscitate)   . Acute on chronic respiratory failure with hypoxia ([place]) 10/29/2019  . Incomplete paraplegia ([place]) 10/28/2019  . Labile blood pressure   . Spasticity   . Muscle spasm   . Hypoxia   . Supplemental oxygen  dependent   . Neurogenic bladder   . Chronic diastolic congestive heart failure ([place])   . Neurogenic bowel   . Hypocalcemia   . Spinal stenosis of lumbar region   . Cervical myelopathy ([place]) 09/26/2019  . Diabetes mellitus type 2, controlled, with complications ([place]) 62/83/6629  . Obese 09/17/2019  . Thoracic aortic aneurysm ([place]) 09/17/2019  . Bowel incontinence 09/17/2019  . Bladder incontinence 09/17/2019  . Right hemiparesis ([place]) 09/17/2019  . Acute renal failure (ARF) ([place]) 09/17/2019  . Left hemiparesis ([place]) 09/17/2019  . Chronic respiratory failure with hypoxia ([place]) 09/17/2019  . Syncope and collapse 09/17/2019  . Chronic diastolic CHF (congestive heart failure) ([place]) 09/04/2019  . PVD (peripheral vascular disease) ([place]) 02/14/2019  . Essential hypertension 02/13/2019  . Non-insulin treated type 2 diabetes mellitus ([place]) 02/13/2019  . Severe sepsis ([place]) 01/17/2019  . Community acquired pneumonia of right lower lobe of lung 01/17/2019  . Postural urinary incontinence 05/20/2018  . Myelopathy concurrent with and due to spinal stenosis of cervical region ([place]) 05/20/2018  . Myoclonic jerking 05/20/2018  . 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 History of respiratory failure 08/18/2017  . Hypokalemia 08/18/2017  . Leukocytosis 08/18/2017  . Muscle atrophy of lower extremity 08/09/2017  . Bowel and  bladder incontinence 08/09/2017  . Right-sided muscle weakness 08/09/2017  . Neuropathy 08/09/2017  . 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/2017  . Abnormality of gait 07/12/2017  . Myoclonic jerkings, 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/2017  . Acquired right foot drop 07/12/2017  . UTI (urinary tract infection) due to Enterococcus 07/12/2017  . Pressure ulcer 06/21/2017  . Acute cystitis 06/20/2017  . Weakness generalized 06/20/2017  . Dehydration 06/20/2017  . Dyspnea 12/26/2016  . Chronic cough 12/26/2016  . Hypersomnia with sleep  apnea 10/30/2016  . CSA (central sleep apnea) 10/30/2016  . Wheezing symptom 10/30/2016  . 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 RLS (restless legs syndrome) 08/25/2014  . Primary gout 08/25/2014  . OSA on CPAP 08/25/2014  . Severe obesity (BMI >= 40) ([place]) 08/25/2014  . Obesity (BMI 30-39.9) 02/18/2013  . 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 S/P TKR (total knee replacement) 11/29/2012  . History of thyroid cancer     Past Surgical History:  Procedure Laterality Date  . ANTERIOR CERVICAL DECOMP/DISCECTOMY FUSION N/A 09/03/2017   Procedure: ACDF - C3-C4 - C4-C5 - C5-C6;  Surgeon: Kary Kos, MD;  Location: [facility];  Service: Neurosurgery;  Laterality: N/A;  . CAROTID ENDARTERECTOMY Left January 03, 2006   Dr. Amedeo Plenty  . COLONOSCOPY    . EYE SURGERY Bilateral    cataract surgery with lens implants  . game keepers thumb Right   . JOINT REPLACEMENT Left    knee X 2  . LUMBAR LAMINECTOMY/ DECOMPRESSION WITH MET-RX N/A 11/04/2019   Procedure: LUMBAR THREE-FOUR LUMBAR LAMINECTOMY/ DECOMPRESSION WITH MET-RX;  Surgeon: Judith Part, MD;  Location: [facility];  Service: Neurosurgery;  Laterality: N/A;  . RADIOLOGY WITH ANESTHESIA N/A 10/16/2019   Procedure: MRI THORACIC LUMBAR SPINE;  Surgeon: Radiologist, Medication, MD;  Location: [facility];  Service: Radiology;  Laterality: N/A;  . ROTATOR CUFF REPAIR Right   . THYROIDECTOMY  2008       Family History  Problem Relation Age of Onset  . COPD Mother   . Hyperlipidemia Mother   . Hypertension Mother   . Diabetes Father   . Kidney disease Father        ESRD  . Hypertension Father   . Cancer Father   . Hyperlipidemia Father     Social History   Tobacco Use  . Smoking status: Former Smoker    Years: 40.00    Types: Cigars, Cigarettes    Quit date: 06/19/2005    Years since quitting: 14.4  . Smokeless tobacco: Never Used  . Tobacco comment: smoked 4-6 cigars daily, only  smoked cigarettes socially  Vaping Use  . Vaping Use: Never used  Substance Use Topics  . Alcohol use: No  . Drug use: No    Home Medications Prior to Admission medications   Medication Sig Start Date End Date Taking? Authorizing Provider  acetaminophen (TYLENOL) 325 MG tablet Take 2 tablets (650 mg total) by mouth every 6 (six) hours as needed (pain or temperature). 10/24/19   Angiulli, Lavon Paganini, PA-C  albuterol (PROVENTIL) (2.5 MG/3ML) 0.083% nebulizer solution Inhale 3 mLs (2.5 mg total) into the lungs every 4 (four) hours as needed for wheezing or shortness of breath. 10/24/19   Angiulli, Lavon Paganini, PA-C  albuterol (VENTOLIN HFA) 108 (90 Base) MCG/ACT inhaler Inhale 1 puff into the lungs every 4 (four) hours as needed for wheezing or shortness of breath. 11/20/19   Angiulli, Lavon Paganini, PA-C  aspirin EC 81 MG EC tablet Take 1 tablet (81 mg total) by mouth daily. 10/24/19   Angiulli, Lavon Paganini, PA-C  atorvastatin (LIPITOR) 20 MG tablet Take 1 tablet (20 mg total) by mouth daily at 6 PM. 11/20/19   Angiulli, Lavon Paganini, PA-C  baclofen (LIORESAL) 10 MG tablet Take 3 tablets (30 mg total) by mouth 3 (three) times daily with meals. 11/20/19   Angiulli, Lavon Paganini, PA-C  baclofen (LIORESAL) 20 MG tablet Take 2 tablets (40 mg total) by mouth at bedtime. 11/20/19   Angiulli, Lavon Paganini, PA-C  blood glucose meter kit and supplies Dispense based on patient and insurance preference. Use up to four times daily as directed. (FOR ICD-10 E10.9, E11.9). 11/21/19   Angiulli, Lavon Paganini, PA-C  blood glucose meter kit and supplies Dispense based on patient and insurance preference. Use up to four times daily as directed. (FOR ICD-10 E10.9, E11.9). 11/25/19   Angiulli, Lavon Paganini, PA-C  calcium citrate (CALCITRATE - DOSED IN MG ELEMENTAL CALCIUM) 950 (200 Ca) MG tablet Take 1 tablet (200 mg of elemental calcium total) by mouth daily. 11/20/19   Angiulli, Lavon Paganini, PA-C  carbidopa-levodopa (SINEMET IR) 10-100 MG tablet Take 1 tablet by mouth 2  (two) times daily. 11/20/19   Angiulli, Lavon Paganini, PA-C  cephALEXin (KEFLEX) 500 MG capsule Take 1 capsule (500 mg total) by mouth 2 (two) times daily for 10 days. 12/15/19 12/25/19  Dorie Rank, MD  cholecalciferol (VITAMIN D) 25 MCG (1000 UNIT) tablet Take 1 tablet (1,000 Units total) by mouth daily. 11/20/19   Angiulli, Lavon Paganini, PA-C  colchicine 0.6 MG tablet Take 1 tablet (0.6 mg total) by mouth daily. 11/20/19   Angiulli, Lavon Paganini, PA-C  docusate sodium (COLACE) 100 MG capsule Take 1 capsule (100 mg total) by mouth 2 (two) times daily. 11/10/19   Hosie Poisson, MD  doxycycline (VIBRAMYCIN) 100 MG capsule Take 1 capsule (100 mg total) by mouth 2 (two) times daily. 12/08/19   Bayard Hugger, NP  enoxaparin (LOVENOX) 40 MG/0.4ML injection Lovenox 40 mg daily subcutaneous until 12/17/2019 and stop 11/20/19   Angiulli, Lavon Paganini, PA-C  fluticasone furoate-vilanterol (BREO ELLIPTA) 200-25 MCG/INH AEPB Inhale 1 puff into the lungs daily. 11/20/19   Angiulli, Lavon Paganini, PA-C  furosemide (LASIX) 40 MG tablet Take 1 tablet (40 mg total) by mouth daily. 11/20/19   Angiulli, Lavon Paganini, PA-C  gabapentin (NEURONTIN) 100 MG capsule Take 1 capsule (100 mg total) by mouth at bedtime. 11/20/19   Angiulli, Lavon Paganini, PA-C  guaiFENesin (MUCINEX) 600 MG 12 hr tablet Take 2 tablets (1,200 mg total) by mouth 2 (two) times daily. 11/20/19   Angiulli, Lavon Paganini, PA-C  HYDROcodone-acetaminophen (NORCO/VICODIN) 5-325 MG tablet Take 1 tablet by mouth 3 (three) times daily as needed for moderate pain or severe pain. 12/08/19   Bayard Hugger, NP  latanoprost (XALATAN) 0.005 % ophthalmic solution Place 1 drop into both eyes at bedtime. Patient not taking: Reported on 12/08/2019 10/24/19   Angiulli, Lavon Paganini, PA-C  levothyroxine (SYNTHROID) 125 MCG tablet Take 1 tablet (125 mcg total) by mouth daily before breakfast. 11/20/19   Angiulli, Lavon Paganini, PA-C  methocarbamol (ROBAXIN) 750 MG tablet Take 1 tablet (750 mg total) by mouth every 6 (six) hours as  needed for muscle spasms. 11/20/19   Angiulli, Lavon Paganini, PA-C  metoprolol succinate (TOPROL-XL) 25 MG 24 hr tablet Take 1 tablet (25 mg total) by mouth daily. 11/21/19   Angiulli, Lavon Paganini, PA-C  Multiple  Vitamin (MULTIVITAMIN) tablet Take 1 tablet by mouth daily.    [provider]  nitroGLYCERIN (NITROSTAT) 0.4 MG SL tablet Place 0.4 mg every 5 (five) minutes as needed under the tongue for chest pain.    [provider]  pantoprazole (PROTONIX) 40 MG tablet Take 1 tablet (40 mg total) by mouth every evening. 11/20/19   Angiulli, Lavon Paganini, PA-C  polyethylene glycol powder (MIRALAX) 17 GM/SCOOP powder Take by mouth as directed. 17 gm every other day     [provider]  senna-docusate (SENOKOT-S) 8.6-50 MG tablet Take 2 tablets by mouth 2 (two) times daily. Patient not taking: Reported on 12/08/2019 11/10/19   Hosie Poisson, MD  spironolactone (ALDACTONE) 25 MG tablet Take 1 tablet (25 mg total) by mouth daily. 11/21/19   Angiulli, Lavon Paganini, PA-C  SUMAtriptan (IMITREX) 50 MG tablet Take 1 tablet (50 mg total) by mouth every 2 (two) hours as needed for migraine. May repeat in 2 hours if headache persists or recurs. 11/20/19   Angiulli, Lavon Paganini, PA-C  tamsulosin (FLOMAX) 0.4 MG CAPS capsule Take 2 capsules (0.8 mg total) by mouth daily after supper. 11/20/19   Angiulli, Lavon Paganini, PA-C  TRELEGY ELLIPTA 100-62.5-25 MCG/INH AEPB Inhale 1 puff into the lungs daily.  05/01/18   [provider]  umeclidinium bromide (INCRUSE ELLIPTA) 62.5 MCG/INH AEPB Inhale 1 puff into the lungs daily. 10/24/19   Angiulli, Lavon Paganini, PA-C    Allergies    Patient has no known allergies.  Review of Systems   Review of Systems  All other systems reviewed and are negative.   Physical Exam Updated Vital Signs BP (!) 95/49   Pulse 79   Temp 98.2 F (36.8 C) (Oral)   Resp 16   SpO2 94%   Physical Exam Vitals and nursing note reviewed.  Constitutional:      General: He is not in acute  distress.    Appearance: He is well-developed.  HENT:     Head: Normocephalic and atraumatic.     Right Ear: External ear normal.     Left Ear: External ear normal.  Eyes:     General: No scleral icterus.       Right eye: No discharge.        Left eye: No discharge.     Conjunctiva/sclera: Conjunctivae normal.  Neck:     Trachea: No tracheal deviation.  Cardiovascular:     Rate and Rhythm: Normal rate and regular rhythm.  Pulmonary:     Effort: Pulmonary effort is normal. No respiratory distress.     Breath sounds: Normal breath sounds. No stridor. No wheezing or rales.  Abdominal:     General: Bowel sounds are normal. There is no distension.     Palpations: Abdomen is soft.     Tenderness: There is abdominal tenderness. There is no guarding or rebound.     Comments: Suprapubic region  Musculoskeletal:     Cervical back: Neck supple.     Right lower leg: No edema.     Left lower leg: No edema.     Comments: Decub ulcers, covered with dressing, no surrounding erythema  Skin:    General: Skin is warm and dry.     Findings: No rash.  Neurological:     Mental Status: He is alert.     Cranial Nerves: No cranial nerve deficit (no facial droop, extraocular movements intact, no slurred speech).     Sensory: No sensory deficit.  Motor: Weakness and atrophy present. No abnormal muscle tone or seizure activity.     Coordination: Coordination normal.     Comments: Left sided weakness     ED Results / Procedures / Treatments   Labs (all labs ordered are listed, but only abnormal results are displayed) Labs Reviewed  CBC - Abnormal; Notable for the following components:      Result Value   WBC 14.3 (*)    RBC 4.13 (*)    Hemoglobin 11.6 (*)    HCT 36.0 (*)    All other components within normal limits  BASIC METABOLIC PANEL - Abnormal; Notable for the following components:   Glucose, Bld 110 (*)    BUN 28 (*)    All other components within normal limits  URINALYSIS,  ROUTINE W REFLEX MICROSCOPIC - Abnormal; Notable for the following components:   APPearance TURBID (*)    Hgb urine dipstick LARGE (*)    Protein, ur 100 (*)    Nitrite POSITIVE (*)    Leukocytes,Ua MODERATE (*)    RBC / HPF >50 (*)    WBC, UA >50 (*)    Bacteria, UA MANY (*)    All other components within normal limits  URINE CULTURE     Procedures Procedures (including critical care time)  Medications Ordered in ED Medications - No data to display  ED Course  I have reviewed the triage vital signs and the nursing notes.  Pertinent labs & imaging results that were available during my care of the patient were reviewed by me and considered in my medical decision making (see chart for details).  Clinical Course as of Dec 14 1033  Mon Dec 15, 2019  1016 Labs reviewed.  Metabolic panel essentially unremarkable.   [JK]  1017 CBC shows stable anemia.  Increased white blood cell count.  Urinalysis does show many bacteria white blood cells and red blood cells.  Suggest infection   [JK]  1030 Foley catheter inserted by nursing staff. Patient is now urinating properly. He is feeling much better.   [JK]  1031 Last blood pressure noted to be in the 90s. Previous vital signs reviewed and patient frequently has a blood pressure in the 90s. Doubt this is related to severe infection.   [JK]    Clinical Course User Index [JK] Dorie Rank, MD   MDM Rules/Calculators/A&P                          Patient presented to ED for evaluation of urinary retention associated with a displaced Foley catheter. This was reinserted by nursing staff the patient is now urinating properly. Urine does appear cloudy. Hard to say whether the patient is having any symptoms from a urinary tract infection rather it being from the urinary retention. He does have an elevated white blood cell count however and I do think it is reasonable start on antibiotics.  Pt and family care comfortable with dc.  WIll refer to  urology considering his plan for long term indwelling foley catheter.  Final Clinical Impression(s) / ED Diagnoses Final diagnoses:  Problem with Foley catheter, initial encounter ([place])  Lower urinary tract infectious disease    Rx / DC Orders ED Discharge Orders         Ordered    cephALEXin (KEFLEX) 500 MG capsule  2 times daily     Discontinue  Reprint     12/15/19 1026  Dorie Rank, MD 12/15/19 [phone]

## 2019-12-16 ENCOUNTER — Other Ambulatory Visit: Payer: Self-pay | Admitting: Adult Health

## 2019-12-17 ENCOUNTER — Encounter (HOSPITAL_BASED_OUTPATIENT_CLINIC_OR_DEPARTMENT_OTHER): Payer: Medicare Other | Admitting: Physician Assistant

## 2019-12-17 DIAGNOSIS — L89150 Pressure ulcer of sacral region, unstageable: Secondary | ICD-10-CM | POA: Diagnosis not present

## 2019-12-17 LAB — URINE CULTURE: Culture: >=100000 — AB

## 2019-12-17 NOTE — Progress Notes (Addendum)
[name], [name] ([id number]) Visit Report for 12/17/2019 Arrival Information Details Patient Name: Date of Service: Cesar [name] 12/17/2019 1:15 PM Medical Record Number: [id number] Patient Account Number: [id number] Date of Birth/Sex: Treating RN: 09-Dec-1940 (79 y.o. [name] Primary Care Kassadie Pancake: [name] Other Clinician: Referring Cole Klugh: Treating Korey Arroyo/Extender: [name] in Treatment: 1 Visit Information History Since Last Visit Added or deleted any medications: No Patient Arrived: Wheel Chair Any new allergies or adverse reactions: No Arrival Time: 14:10 Had a fall or experienced change in No Accompanied By: wife activities of daily living that may affect Transfer Assistance: Harrel Lemon Lift risk of falls: Patient Identification Verified: Yes Signs or symptoms of abuse/neglect since last visito No Secondary Verification Process Completed: Yes Hospitalized since last visit: No Patient Has Alerts: Yes Implantable device outside of the clinic excluding No Patient Alerts: R ABI: 0.89 TBI: 0.64 cellular tissue based products placed in the center L ABI: 0.62 (01/2019) since last visit: Has Dressing in Place as Prescribed: Yes Pain Present Now: Yes Electronic Signature(s) Signed: 12/17/2019 5:49:00 PM By: [name] RN, BSN Entered By: [name] on 12/17/2019 14:11:45 -------------------------------------------------------------------------------- Encounter Discharge Information Details Patient Name: Date of Service: Cesar [facility], [name] 12/17/2019 1:15 PM Medical Record Number: [id number] Patient Account Number: [id number] Date of Birth/Sex: Treating RN: 09/05/1940 (78 y.o. [name] Primary Care Shamecka Hocutt: [name] Other Clinician: Referring Shakeerah Gradel: Treating [name]/Extender: [name] in Treatment: 1 Encounter Discharge Information Items Post Procedure Vitals Discharge  Condition: Stable Temperature (F): 98.8 Ambulatory Status: Wheelchair Pulse (bpm): 86 Discharge Destination: Home Respiratory Rate (breaths/min): 18 Transportation: Private Auto Blood Pressure (mmHg): 90/55 Accompanied By: wife Schedule Follow-up Appointment: No Clinical Summary of Care: Electronic Signature(s) Signed: 12/17/2019 5:31:10 PM By: Carlene Coria RN Entered By: Carlene Coria on 12/17/2019 15:19:41 -------------------------------------------------------------------------------- Lower Extremity Assessment Details Patient Name: Date of Service: Cesar [name] M. 12/17/2019 1:15 PM Medical Record Number: [id number] Patient Account Number: [id number] Date of Birth/Sex: Treating RN: [birth year] (79 y.o. [name] Primary Care [name]: [name] Other Clinician: Referring Valeria Krisko: Treating Daksha Koone/Extender: [name] in Treatment: 1 Edema Assessment Assessed: [Left: No] Patrice Paradise: No] Edema: [Left: No] [Right: No] Calf Left: Right: Point of Measurement: 28 cm From Medial Instep 21 cm 33.8 cm Ankle Left: Right: Point of Measurement: 10 cm From Medial Instep 31 cm 21.8 cm Vascular Assessment Pulses: Dorsalis Pedis Palpable: [Left:Yes] [Right:Yes] Electronic Signature(s) Signed: 12/17/2019 5:49:00 PM By: [name] RN, BSN Entered By: [name] on 12/17/2019 14:12:36 -------------------------------------------------------------------------------- [place] Details Patient Name: Date of Service: Cesar [name], [name] 12/17/2019 1:15 PM Medical Record Number: [id number] Patient Account Number: [id number] Date of Birth/Sex: Treating RN: 11-29-1940 (79 y.o. [name] Primary Care Nicolas Sisler: [name] Other Clinician: Referring Stellah Donovan: Treating [name]/Extender: [name] in Treatment: 1 Active Inactive Abuse / Safety / Falls / Self Care Management Nursing  Diagnoses: Potential for falls Goals: Patient/caregiver will verbalize/demonstrate measures taken to prevent injury and/or falls Date Initiated: 12/10/2019 Target Resolution Date: 01/07/2020 Goal Status: Active Interventions: Assess fall risk on admission and as needed Assess: immobility, friction, shearing, incontinence upon admission and as needed Notes: Nutrition Nursing Diagnoses: Potential for alteratiion in Nutrition/Potential for imbalanced nutrition Goals: Patient/caregiver will maintain therapeutic glucose control Date Initiated: 12/10/2019 Target Resolution Date: 01/07/2020 Goal Status: Active Interventions: Assess HgA1c results as ordered upon admission and as needed Provide education  on elevated blood sugars and impact on wound healing Treatment Activities: Patient referred to Primary Care Physician for further nutritional evaluation : 12/10/2019 Notes: Pressure Nursing Diagnoses: Knowledge deficit related to management of pressures ulcers Potential for impaired tissue integrity related to pressure, friction, moisture, and shear Goals: Patient/caregiver will verbalize understanding of pressure ulcer management Date Initiated: 12/10/2019 Target Resolution Date: 01/07/2020 Goal Status: Active Interventions: Assess: immobility, friction, shearing, incontinence upon admission and as needed Assess offloading mechanisms upon admission and as needed Assess potential for pressure ulcer upon admission and as needed Notes: Wound/Skin Impairment Nursing Diagnoses: Impaired tissue integrity Knowledge deficit related to ulceration/compromised skin integrity Goals: Patient/caregiver will verbalize understanding of skin care regimen Date Initiated: 12/10/2019 Target Resolution Date: 01/07/2020 Goal Status: Active Ulcer/skin breakdown will have a volume reduction of 30% by week 4 Date Initiated: 12/10/2019 Target Resolution Date: 01/14/2020 Goal Status:  Active Interventions: Assess patient/caregiver ability to obtain necessary supplies Assess patient/caregiver ability to perform ulcer/skin care regimen upon admission and as needed Assess ulceration(s) every visit Provide education on ulcer and skin care Treatment Activities: Skin care regimen initiated : 12/10/2019 Topical wound management initiated : 12/10/2019 Notes: Electronic Signature(s) Signed: 12/17/2019 5:49:12 PM By: Baruch Gouty RN, BSN Entered By: Baruch Gouty on 12/17/2019 14:31:03 -------------------------------------------------------------------------------- Pain Assessment Details Patient Name: Date of Service: Cesar [facility], [name] 12/17/2019 1:15 PM Medical Record Number: [id number] Patient Account Number: [id number] Date of Birth/Sex: Treating RN: 02-22-1941 (79 y.o. [name] Primary Care Zacarias Krauter: [name] Other Clinician: Referring Jasmeet Manton: Treating Magaline Steinberg/Extender: [name] in Treatment: 1 Active Problems Location of Pain Severity and Description of Pain Patient Has Paino Yes Site Locations Pain Location: Pain in Ulcers With Dressing Change: Yes Rate the pain. Current Pain Level: 5 Character of Pain Describe the Pain: Throbbing Pain Management and Medication Current Pain Management: Medication: Yes Cold Application: No Rest: No Massage: No Activity: No T.E.N.S.: No Heat Application: No Leg drop or elevation: No Is the Current Pain Management Adequate: Adequate How does your wound impact your activities of daily livingo Sleep: No Bathing: No Appetite: No Relationship With Others: No Bladder Continence: No Emotions: No Bowel Continence: No Work: No Toileting: No Drive: No Dressing: No Hobbies: No Electronic Signature(s) Signed: 12/17/2019 5:49:00 PM By: [name] RN, BSN Entered By: [name] on 12/17/2019  14:12:30 -------------------------------------------------------------------------------- Patient/Caregiver Education Details Patient Name: Date of Service: Cesar [facility]. 6/30/2021andnbsp1:15 PM Medical Record Number: [id number] Patient Account Number: [id number] Date of Birth/Gender: Treating RN: April 12, 1941 (79 y.o. [name] Primary Care Physician: [name] Other Clinician: Referring Physician: Treating Physician/Extender: [name] in Treatment: 1 Education Assessment Education Provided To: Patient Education Topics Provided Pressure: Methods: Explain/Verbal Responses: Reinforcements needed, State content correctly Wound/Skin Impairment: Methods: Explain/Verbal Responses: Reinforcements needed, State content correctly Electronic Signature(s) Signed: 12/17/2019 5:49:12 PM By: Baruch Gouty RN, BSN Entered By: Baruch Gouty on 12/17/2019 14:31:57 -------------------------------------------------------------------------------- Wound Assessment Details Patient Name: Date of Service: Cesar [facility], [name] 12/17/2019 1:15 PM Medical Record Number: [id number] Patient Account Number: [id number] Date of Birth/Sex: Treating RN: Aug 15, 1940 (79 y.o. [name] Primary Care Tashai Catino: [name] Other Clinician: Referring Montia Haslip: Treating Kade Demicco/Extender: [name] in Treatment: 1 Wound Status Wound Number: 1 Primary Pressure Ulcer Etiology: Wound Location: Sacrum Wound Open Wounding Event: Pressure Injury Status: Date Acquired: 09/18/2019 Comorbid Glaucoma, Chronic Obstructive Pulmonary Disease (COPD), Sleep Weeks Of Treatment: 1 History: Apnea, Congestive Heart Failure,  Hypertension, Peripheral Arterial Clustered Wound: No Disease, Type II Diabetes, Gout, Osteoarthritis, Paraplegia, Received Radiation Photos Photo Uploaded By: Mikeal Hawthorne on 12/19/2019 11:50:42 Wound  Measurements Length: (cm) 7.8 Width: (cm) 6 Depth: (cm) 2.7 Area: (cm) 36.757 Volume: (cm) 99.243 % Reduction in Area: -67.1% % Reduction in Volume: -41% Epithelialization: None Tunneling: No Undermining: Yes Starting Position (o'clock): 9 Ending Position (o'clock): 12 Maximum Distance: (cm) 4.2 Wound Description Classification: Unstageable/Unclassified Wound Margin: Well defined, not attached Exudate Amount: Medium Exudate Type: Serosanguineous Exudate Color: red, brown Foul Odor After Cleansing: Yes Due to Product Use: No Slough/Fibrino Yes Wound Bed Granulation Amount: Small (1-33%) Exposed Structure Granulation Quality: Red, Pink Fascia Exposed: No Necrotic Amount: Large (67-100%) Fat Layer (Subcutaneous Tissue) Exposed: No Necrotic Quality: Eschar, Adherent Slough Tendon Exposed: No Muscle Exposed: No Joint Exposed: No Bone Exposed: No Treatment Notes Wound #1 (Sacrum) 1. Cleanse With Wound Cleanser 3. Primary Dressing Applied Other primary dressing (specifiy in notes) 4. Secondary Dressing Dry Gauze 5. Secured With Tape Notes packed with wet to dry Electronic Signature(s) Signed: 12/17/2019 5:49:00 PM By: [name] RN, BSN Entered By: [name] on 12/17/2019 14:13:45 -------------------------------------------------------------------------------- Wound Assessment Details Patient Name: Date of Service: Cesar [facility], [name] 12/17/2019 1:15 PM Medical Record Number: [id number] Patient Account Number: [id number] Date of Birth/Sex: Treating RN: 1940-07-13 (79 y.o. [name] Primary Care Shirel Mallis: [name] Other Clinician: Referring Andrya Roppolo: Treating Dhiren Azimi/Extender: [name] in Treatment: 1 Wound Status Wound Number: 2 Primary Pressure Ulcer Etiology: Wound Location: Left, Lateral Malleolus Secondary Diabetic Wound/Ulcer of the Lower Extremity Wounding Event: Pressure Injury Etiology: Date  Acquired: 11/17/2019 Wound Open Weeks Of Treatment: 1 Status: Clustered Wound: No Comorbid Glaucoma, Chronic Obstructive Pulmonary Disease (COPD), History: Sleep Apnea, Congestive Heart Failure, Hypertension, Peripheral Arterial Disease, Type II Diabetes, Gout, Osteoarthritis, Paraplegia, Received Radiation Photos Photo Uploaded By: Mikeal Hawthorne on 12/19/2019 11:59:48 Wound Measurements Length: (cm) 0.6 Width: (cm) 0.8 Depth: (cm) 0.2 Area: (cm) 0.377 Volume: (cm) 0.075 % Reduction in Area: 23.8% % Reduction in Volume: -53.1% Epithelialization: None Tunneling: No Undermining: No Wound Description Classification: Unstageable/Unclassified Wound Margin: Flat and Intact Exudate Amount: Medium Exudate Type: Serosanguineous Exudate Color: red, brown Foul Odor After Cleansing: No Slough/Fibrino Yes Wound Bed Granulation Amount: None Present (0%) Exposed Structure Necrotic Amount: Large (67-100%) Fascia Exposed: No Necrotic Quality: Adherent Slough Fat Layer (Subcutaneous Tissue) Exposed: No Tendon Exposed: No Muscle Exposed: No Joint Exposed: No Bone Exposed: No Treatment Notes Wound #2 (Left, Lateral Malleolus) 3. Primary Dressing Applied [employer] 4. Secondary Dressing Dry Gauze 5. Secured With Recruitment consultant) Signed: 12/17/2019 5:49:00 PM By: [name] RN, BSN Entered By: [name] on 12/17/2019 14:14:37 -------------------------------------------------------------------------------- Wound Assessment Details Patient Name: Date of Service: Cesar [facility], [name] 12/17/2019 1:15 PM Medical Record Number: [id number] Patient Account Number: [id number] Date of Birth/Sex: Treating RN: 21-Sep-1940 (79 y.o. [name] Primary Care Jailee Jaquez: [name] Other Clinician: Referring Diamonique Ruedas: Treating Makahla Kiser/Extender: [name] in Treatment: 1 Wound Status Wound Number: 3 Primary Pressure  Ulcer Etiology: Wound Location: Right Achilles Secondary Diabetic Wound/Ulcer of the Lower Extremity Wounding Event: Pressure Injury Etiology: Etiology: Date Acquired: 09/18/2019 Wound Open Weeks Of Treatment: 1 Status: Clustered Wound: No Comorbid Glaucoma, Chronic Obstructive Pulmonary Disease (COPD), History: Sleep Apnea, Congestive Heart Failure, Hypertension, Peripheral Arterial Disease, Type II Diabetes, Gout, Osteoarthritis, Paraplegia, Received Radiation Photos Photo Uploaded By: Mikeal Hawthorne on 12/19/2019 11:59:49 Wound Measurements Length: (cm) 4.5 Width: (  cm) 1 Depth: (cm) 0.1 Area: (cm) 3.534 Volume: (cm) 0.353 % Reduction in Area: 10% % Reduction in Volume: 10.2% Epithelialization: Small (1-33%) Tunneling: No Undermining: No Wound Description Classification: Category/Stage III Wound Margin: Flat and Intact Exudate Amount: Medium Exudate Type: Serosanguineous Exudate Color: red, brown Foul Odor After Cleansing: No Slough/Fibrino Yes Wound Bed Granulation Amount: Medium (34-66%) Exposed Structure Granulation Quality: Red Fascia Exposed: No Necrotic Amount: Medium (34-66%) Fat Layer (Subcutaneous Tissue) Exposed: Yes Necrotic Quality: Adherent Slough Tendon Exposed: No Muscle Exposed: No Joint Exposed: No Bone Exposed: No Treatment Notes Wound #3 (Right Achilles) 3. Primary Dressing Applied [employer] 4. Secondary Dressing Dry Gauze 5. Secured With Recruitment consultant) Signed: 12/17/2019 5:49:00 PM By: [name] RN, BSN Entered By: [name] on 12/17/2019 14:17:28 -------------------------------------------------------------------------------- Wound Assessment Details Patient Name: Date of Service: Cesar [facility], [name] 12/17/2019 1:15 PM Medical Record Number: [id number] Patient Account Number: [id number] Date of Birth/Sex: Treating RN: 04-16-1941 (79 y.o. [name] Primary Care Erynne Kealey: [name] Other  Clinician: Referring Ceyda Peterka: Treating Holle Sprick/Extender: [name] in Treatment: 1 Wound Status Wound Number: 4 Primary Diabetic Wound/Ulcer of the Lower Extremity Etiology: Wound Location: Right, Dorsal Foot Wound Healed - Epithelialized Wounding Event: Gradually Appeared Status: Date Acquired: 11/18/2019 Comorbid Glaucoma, Chronic Obstructive Pulmonary Disease (COPD), Weeks Of Treatment: 1 History: Sleep Apnea, Congestive Heart Failure, Hypertension, Peripheral Clustered Wound: No Arterial Disease, Type II Diabetes, Gout, Osteoarthritis, Paraplegia, Received Radiation Photos Wound Measurements Length: (cm) 0 Width: (cm) 0 Depth: (cm) 0 Area: (cm) 0 Volume: (cm) 0 % Reduction in Area: 100% % Reduction in Volume: 100% Epithelialization: Small (1-33%) Tunneling: No Undermining: No Wound Description Classification: Unable to visualize wound bed Wound Margin: Flat and Intact Exudate Amount: Small Exudate Type: Serosanguineous Exudate Color: red, brown Foul Odor After Cleansing: No Slough/Fibrino No Wound Bed Granulation Amount: None Present (0%) Exposed Structure Necrotic Amount: Large (67-100%) Fascia Exposed: No Necrotic Quality: Eschar Fat Layer (Subcutaneous Tissue) Exposed: No Tendon Exposed: No Muscle Exposed: No Joint Exposed: No Bone Exposed: No Electronic Signature(s) Signed: 12/19/2019 3:59:35 PM By: Baruch Gouty RN, BSN Signed: 12/19/2019 5:00:01 PM By: Mikeal Hawthorne EMT/HBOT/SD Previous Signature: 12/17/2019 5:49:12 PM Version By: Baruch Gouty RN, BSN Entered By: Mikeal Hawthorne on 12/19/2019 12:00:48 -------------------------------------------------------------------------------- Vitals Details Patient Name: Date of Service: Cesar [facility], [name] 12/17/2019 1:15 PM Medical Record Number: [id number] Patient Account Number: [id number] Date of Birth/Sex: Treating RN: [birth year] (79 y.o. [name] Primary Care  Peytan Andringa: [name] Other Clinician: Referring Sesar Madewell: Treating Kynslie Ringle/Extender: [name] in Treatment: 1 Vital Signs Time Taken: 14:11 Temperature (F): 98.7 Height (in): 67 Pulse (bpm): 86 Weight (lbs): 211 Respiratory Rate (breaths/min): 18 Body Mass Index (BMI): 33 Blood Pressure (mmHg): 90/55 Reference Range: 80 - 120 mg / dl Electronic Signature(s) Signed: 12/17/2019 5:49:00 PM By: [name] RN, BSN Entered By: [name] on 12/17/2019 14:12:15

## 2019-12-17 NOTE — Progress Notes (Addendum)
MYRL, LAZARUS ([id number]) Visit Report for 12/17/2019 Chief Complaint Document Details Patient Name: Date of Service: [name] 12/17/2019 1:15 PM Medical Record Number: [id number] Patient Account Number: [id number] Date of Birth/Sex: Treating RN: December 16, 1940 (79 y.o. [name] Primary Care Provider: [name] Other Clinician: Referring Provider: Treating Provider/Extender: [name] in Treatment: 1 Information Obtained from: Patient Chief Complaint Sacral, right achilles, left ankle, and right dorsal foot ulcers Electronic Signature(s) Signed: 12/17/2019 1:16:28 PM By: Worthy Keeler PA-C Entered By: Worthy Keeler on 12/17/2019 13:16:28 -------------------------------------------------------------------------------- Debridement Details Patient Name: Date of Service: MA [facility], [name] 12/17/2019 1:15 PM Medical Record Number: [id number] Patient Account Number: [id number] Date of Birth/Sex: Treating RN: 01/16/1941 (79 y.o. [name] Primary Care Provider: [name] Other Clinician: Referring Provider: Treating Provider/Extender: [name] in Treatment: 1 Debridement Performed for Assessment: Wound #1 Sacrum Performed By: Physician Worthy Keeler, PA Debridement Type: Debridement Level of Consciousness (Pre-procedure): Awake and Alert Pre-procedure Verification/Time Out Yes - 14:33 Taken: Start Time: 14:34 Pain Control: Other : benzocaine 20% spray T Area Debrided (L x W): otal 1 (cm) x 1 (cm) = 1 (cm) Tissue and other material debrided: Viable, Non-Viable, Slough, Subcutaneous, Slough Level: Skin/Subcutaneous Tissue Debridement Description: Excisional Instrument: Curette Bleeding: Minimum Hemostasis Achieved: Pressure End Time: 14:36 Procedural Pain: 8 Post Procedural Pain: 4 Response to Treatment: Procedure was tolerated well Level of Consciousness (Post- Awake and  Alert procedure): Post Debridement Measurements of Total Wound Length: (cm) 7.8 Stage: Unstageable/Unclassified Width: (cm) 6 Depth: (cm) 2.7 Volume: (cm) 99.243 Character of Wound/Ulcer Post Debridement: Requires Further Debridement Post Procedure Diagnosis Same as Pre-procedure Electronic Signature(s) Signed: 12/17/2019 5:49:12 PM By: Baruch Gouty RN, BSN Signed: 12/17/2019 5:59:30 PM By: Worthy Keeler PA-C Entered By: Baruch Gouty on 12/17/2019 14:36:19 -------------------------------------------------------------------------------- HPI Details Patient Name: Date of Service: MA [facility], [name] 12/17/2019 1:15 PM Medical Record Number: [id number] Patient Account Number: [id number] Date of Birth/Sex: Treating RN: 1940-11-29 (79 y.o. [name] Primary Care Provider: [name] Other Clinician: Referring Provider: Treating Provider/Extender: [name] in Treatment: 1 History of Present Illness HPI Description: 12/10/2019 upon evaluation today patient appears for initial evaluation here in our clinic unfortunately due to having multiple pressure injuries noted at this point. Including in the sacral region, left ankle, and Achilles region. He also has an ulcer on top of his foot but this appears to be mainly dry I do not even know that there is really a major issue here but nonetheless we are going to continue to monitor this and to the eschar pops off. He still is having some pain he also had apparently some infection around this area that his primary care provider placed him on antibiotic for. The patient does have hypertension and also muscle weakness that came on all of a sudden at the beginning of this year around March and unfortunately he has not been able to walk since that time prior to that he was walking with some weakness due to spinal stenosis but nothing like what he is experiencing right now. He does spend a lot of his day  sitting though he does reposition in his chair I am not sure if that is sufficient or not we will have to see how things progress. Nonetheless the sacral wound is definitely the worst part of everything that he has at this point. He does have diabetes  which is very well controlled most recent hemoglobin A1c was 5.4. 12/17/2019 upon evaluation today patient appears to be doing decently well in regard to several of his ulcers on the foot and ankle region. I am very pleased with this. The Achilles area is also doing well. In fact the dorsal surface of the foot is completely healed and this is great news. The sacral region however is another story he tends to sit in his chair most of the day leaning back and I think this is putting pressure on this area even though he is trying to get in a position that is comfortable for him unfortunately I think this is leading to him having more issues with skin breakdown and I think this wound is good to get worse not better if were not careful. I explained that to the patient and his wife today during the evaluation. Electronic Signature(s) Signed: 12/17/2019 2:42:44 PM By: Worthy Keeler PA-C Entered By: Worthy Keeler on 12/17/2019 14:42:43 -------------------------------------------------------------------------------- Physical Exam Details Patient Name: Date of Service: MA [name], [name] 12/17/2019 1:15 PM Medical Record Number: [id number] Patient Account Number: [id number] Date of Birth/Sex: Treating RN: [birth year] (79 y.o. [name] Primary Care Provider: [name] Other Clinician: Referring Provider: Treating Provider/Extender: [name] in Treatment: 1 Constitutional Well-nourished and well-hydrated in no acute distress. Respiratory normal breathing without difficulty. Psychiatric this patient is able to make decisions and demonstrates good insight into disease process. Alert and Oriented x 3. pleasant and  cooperative. Notes Upon inspection patient's wound bed actually showed signs of good granulation at this time. There does not appear to be any evidence of active infection which is good news and overall very pleased with the progress that is been made. I did review his culture results and this really did not show anything in particular that I think is related to infection. Has been on Keflex and it showed that he did have a corynebacterium but again I do not think this is relevant to the wound currently I think the way the wound looks is simply due to pressure getting to the area. He said not most of the time in his chair throughout the day as he pretty much gets up in his chair in the morning when his wife has helped using a lift and does not get back into the bed until later when they have help again. Electronic Signature(s) Signed: 12/17/2019 2:43:55 PM By: Worthy Keeler PA-C Entered By: Worthy Keeler on 12/17/2019 14:43:55 -------------------------------------------------------------------------------- Physician Orders Details Patient Name: Date of Service: MA [facility], [name] 12/17/2019 1:15 PM Medical Record Number: [id number] Patient Account Number: [id number] Date of Birth/Sex: Treating RN: 1941/01/07 (79 y.o. 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 - allow extra  time, need Harrel Lemon Return A Dressing Change Frequency Wound #1 Sacrum Change dressing every day. Wound #2 Left,Lateral Malleolus Change Dressing every other day. Wound #3 Right Achilles Change Dressing every other day. Wound Cleansing  Wound #1 Sacrum Clean wound with Wound Cleanser Wound #2 Left,Lateral Malleolus May shower and wash wound with soap and water. Wound #3 Right Achilles May shower and wash wound with soap and water. Primary Wound Dressing Wound #1 Sacrum Other: - Vashe moistened gauze packing Wound #2 Left,Lateral Malleolus Silver Collagen - moisten with saline Wound #3 Right Achilles Silver Collagen - moisten with saline Secondary Dressing Wound #1 Sacrum ABD pad - or foam border Wound #2 Left,Lateral Malleolus Foam Border - or large bandaid Wound #3 Right Achilles Foam Border - or large bandaid Off-Loading Multipodus Splint to: - or bunny boots to both feet especially when in bed Low air-loss mattress (Group 2) Turn and reposition every 2 hours Electronic Signature(s) Signed: 12/17/2019 5:49:12 PM By: Baruch Gouty RN, BSN Signed: 12/17/2019 5:59:30 PM By: Worthy Keeler PA-C Entered By: Baruch Gouty on 12/17/2019 14:39:44 -------------------------------------------------------------------------------- Problem List Details Patient Name: Date of Service: MA [facility], [place] M. 12/17/2019 1:15 PM Medical Record Number: [id number] Patient Account Number: [id number] Date of Birth/Sex: Treating RN: [birth year] (78 y.o. 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, stage 3  12/10/2019 No Yes E11.621 Type 2 diabetes mellitus with foot ulcer 12/10/2019 No Yes L97.518 Non-pressure chronic ulcer of other part of right foot with other specified 12/10/2019 No Yes severity I10 Essential (primary) hypertension 12/10/2019 No Yes M62.81 Muscle weakness (generalized) 12/10/2019 No Yes Inactive Problems Resolved Problems Electronic Signature(s) Signed: 12/17/2019 1:16:23 PM By: Worthy Keeler PA-C Entered By: Worthy Keeler on 12/17/2019 13:16:23 -------------------------------------------------------------------------------- Progress Note Details Patient Name: Date of Service: [place] [facility], [name] 12/17/2019 1:15 PM Medical Record Number: [id number] Patient Account Number: [id number] Date of Birth/Sex: Treating RN: [birth year] (79 y.o. [name] Primary Care Provider: [name] Other Clinician: Referring Provider: Treating Provider/Extender: [name] in Treatment: 1 Subjective Chief Complaint Information obtained from Patient Sacral, right achilles, left ankle, and right dorsal foot ulcers History of Present Illness (HPI) 12/10/2019 upon evaluation today patient appears for initial evaluation here in our clinic unfortunately due to having multiple pressure injuries noted at this point. Including in the sacral region, left ankle, and Achilles region. He also has an ulcer on top of his foot but this appears to be mainly dry I do not even know that there is really a major issue here but nonetheless we are going to continue to monitor this and to the eschar pops off. He still is having some pain he also had apparently some infection around this area that his primary care provider placed him on antibiotic for. The patient does have hypertension and also muscle weakness that came on all of a sudden at the beginning of this year around March and unfortunately he has not been able to walk since that time prior to that he was walking with  some weakness due to spinal stenosis but nothing like what he is experiencing right now. He does spend a lot of his day sitting though he does reposition in his chair I am not sure if that is sufficient or not we will have to see how things progress. Nonetheless the sacral wound is definitely the worst part of everything  that he has at this point. He does have diabetes which is very well controlled most recent hemoglobin A1c was 5.4. 12/17/2019 upon evaluation today patient appears to be doing decently well in regard to several of his ulcers on the foot and ankle region. I am very pleased with this. The Achilles area is also doing well. In fact the dorsal surface of the foot is completely healed and this is great news. The sacral region however is another story he tends to sit in his chair most of the day leaning back and I think this is putting pressure on this area even though he is trying to get in a position that is comfortable for him unfortunately I think this is leading to him having more issues with skin breakdown and I think this wound is good to get worse not better if were not careful. I explained that to the patient and his wife today during the evaluation. Objective Constitutional Well-nourished and well-hydrated in no acute distress. Vitals Time Taken: 2:11 PM, Height: 67 in, Weight: 211 lbs, BMI: 33, Temperature: 98.7 F, Pulse: 86 bpm, Respiratory Rate: 18 breaths/min, Blood Pressure: 90/55 mmHg. Respiratory normal breathing without difficulty. Psychiatric this patient is able to make decisions and demonstrates good insight into disease process. Alert and Oriented x 3. pleasant and cooperative. General Notes: Upon inspection patient's wound bed actually showed signs of good granulation at this time. There does not appear to be any evidence of active infection which is good news and overall very pleased with the progress that is been made. I did review his culture results and this  really did not show anything in particular that I think is related to infection. Has been on Keflex and it showed that he did have a corynebacterium but again I do not think this is relevant to the wound currently I think the way the wound looks is simply due to pressure getting to the area. He said not most of the time in his chair throughout the day as he pretty much gets up in his chair in the morning when his wife has helped using a lift and does not get back into the bed until later when they have help again. Integumentary (Hair, Skin) Wound #1 status is Open. Original cause of wound was Pressure Injury. The wound is located on the Sacrum. The wound measures 7.8cm length x 6cm width x 2.7cm depth; 36.757cm^2 area and 99.243cm^3 volume. There is no tunneling noted, however, there is undermining starting at 9:00 and ending at 12:00 with a maximum distance of 4.2cm. There is a medium amount of serosanguineous drainage noted. Foul odor after cleansing was noted. The wound margin is well defined and not attached to the wound base. There is small (1-33%) red, pink granulation within the wound bed. There is a large (67-100%) amount of necrotic tissue within the wound bed including Eschar and Adherent Slough. Wound #2 status is Open. Original cause of wound was Pressure Injury. The wound is located on the Left,Lateral Malleolus. The wound measures 0.6cm length x 0.8cm width x 0.2cm depth; 0.377cm^2 area and 0.075cm^3 volume. There is no tunneling or undermining noted. There is a medium amount of serosanguineous drainage noted. The wound margin is flat and intact. There is no granulation within the wound bed. There is a large (67-100%) amount of necrotic tissue within the wound bed including Adherent Slough. Wound #3 status is Open. Original cause of wound was Pressure Injury. The wound is located on the Right  Achilles. The wound measures 4.5cm length x 1cm width x 0.1cm depth; 3.534cm^2 area and  0.353cm^3 volume. There is Fat Layer (Subcutaneous Tissue) Exposed exposed. There is no tunneling or undermining noted. There is a medium amount of serosanguineous drainage noted. The wound margin is flat and intact. There is medium (34-66%) red granulation within the wound bed. There is a medium (34-66%) amount of necrotic tissue within the wound bed including Adherent Slough. Wound #4 status is Healed - Epithelialized. Original cause of wound was Gradually Appeared. The wound is located on the Right,Dorsal Foot. The wound measures 0cm length x 0cm width x 0cm depth; 0cm^2 area and 0cm^3 volume. There is no tunneling or undermining noted. There is a small amount of serosanguineous drainage noted. The wound margin is flat and intact. There is no granulation within the wound bed. There is a large (67-100%) amount of necrotic tissue within the wound bed including Eschar. 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 There was a Excisional Skin/Subcutaneous Tissue Debridement with a total area of 1 sq cm performed by Worthy Keeler, PA. With the following instrument(s): Curette to remove Viable and Non-Viable tissue/material. Material removed includes Subcutaneous Tissue and Slough and after achieving pain control using Other (benzocaine 20% spray). No specimens were taken. A time out was conducted at 14:33, prior to the start of the procedure. A Minimum amount of bleeding was controlled with Pressure. The procedure was tolerated well with a pain level of 8 throughout and a pain level of 4 following  the procedure. Post Debridement Measurements: 7.8cm length x 6cm width x 2.7cm depth; 99.243cm^3 volume. Post debridement Stage noted as Unstageable/Unclassified. Character of Wound/Ulcer Post Debridement requires further debridement. Post procedure Diagnosis Wound #1: Same as Pre-Procedure Plan Follow-up Appointments: Return Appointment in 1 week. - allow extra time, need Hoyer Dressing Change Frequency: Wound #1 Sacrum: Change dressing every day. Wound #2 Left,Lateral Malleolus: Change Dressing every other day. Wound #3 Right Achilles: Change Dressing every other day. Wound Cleansing: Wound #1 Sacrum: Clean wound with Wound Cleanser Wound #2 Left,Lateral Malleolus: May shower and wash wound with soap and water. Wound #3 Right Achilles: May shower and wash wound with soap and water. Primary Wound Dressing: Wound #1 Sacrum: Other: - Vashe moistened gauze packing Wound #2 Left,Lateral Malleolus: Silver Collagen - moisten with saline Wound #3 Right Achilles: Silver Collagen - moisten with saline Secondary Dressing: Wound #1 Sacrum: ABD pad - or foam border Wound #2 Left,Lateral Malleolus: Foam Border - or large bandaid Wound #3 Right Achilles: Foam Border - or large bandaid Off-Loading: Multipodus Splint to: - or bunny boots to both feet especially when in bed Low air-loss mattress (Group 2) Turn and reposition every 2 hours 1. I would recommend currently that we go ahead and initiate treatment with a continuation of the current wound care measures with collagen to the Achilles and ankle region I think that is perfect. 2. With regard to the sacral region I am still can recommend the Vashe I discussed that on Sunday hopefully that can help clean this area but only if he is able to keep pressure off the area. This may mean  that he needs to stay in bed more repositioning and offloading. 3. With regard to the motorized wheelchair I completely understand the freedom this gives him  but unfortunately I think that if he is not careful this is good to be of greater detriment to his wound and could cause significant issues for him. We will see patient back for reevaluation in 1 week here in the clinic. If anything worsens or changes patient will contact our office for additional recommendations. Electronic Signature(s) Signed: 12/17/2019 2:45:06 PM By: Worthy Keeler PA-C Entered By: Worthy Keeler on 12/17/2019 14:45:06 -------------------------------------------------------------------------------- SuperBill Details Patient Name: Date of Service: MA [facility], [name] 12/17/2019 Medical Record Number: [id number] Patient Account Number: [id number] Date of Birth/Sex: Treating RN: June 09, 1941 (79 y.o. 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

## 2019-12-18 ENCOUNTER — Telehealth: Payer: Self-pay

## 2019-12-18 ENCOUNTER — Other Ambulatory Visit: Payer: Self-pay | Admitting: Adult Health

## 2019-12-18 NOTE — Telephone Encounter (Signed)
Post ED Visit - Positive Culture Follow-up  Culture report reviewed by antimicrobial stewardship pharmacist: [place] Team []  Elenor Quinones, Pharm.D. []  Heide Guile, Pharm.D., BCPS AQ-ID []  Parks Neptune, Pharm.D., BCPS []  Alycia Rossetti, Pharm.D., BCPS []  [place], Pharm.D., BCPS, AAHIVP []  Legrand Como, Pharm.D., BCPS, AAHIVP []  Salome Arnt, PharmD, BCPS []  Johnnette Gourd, PharmD, BCPS []  Hughes Better, PharmD, BCPS []  Leeroy Cha, PharmD []  Laqueta Linden, PharmD, BCPS []  Albertina Parr, PharmD  [place] Team []  Leodis Sias, PharmD []  Lindell Spar, PharmD []  Royetta Asal, PharmD []  Graylin Shiver, Rph []  Rema Fendt) Glennon Mac, PharmD []  Arlyn Dunning, PharmD []  Netta Cedars, PharmD []  Dia Sitter, PharmD []  Leone Haven, PharmD []  Gretta Arab, PharmD []  Theodis Shove, PharmD []  Peggyann Juba, PharmD []  Reuel Boom, PharmD Dolly Rias Pharm D  Positive urine culture Treated with Cephalexin, organism sensitive to the same and no further patient follow-up is required at this time.  Genia Del 12/18/2019, 10:48 AM

## 2019-12-18 NOTE — Telephone Encounter (Signed)
SW continuing to pursue home health agencies. If home health is approved, pt will have home health the week of 12/22/2019 (12/21/2019- July 4th holiday).

## 2019-12-23 ENCOUNTER — Telehealth: Payer: Self-pay

## 2019-12-23 NOTE — Telephone Encounter (Signed)
Pt calling for baclofen (LIORESAL) 10 MG tablet  Refill

## 2019-12-23 NOTE — Telephone Encounter (Signed)
Sw received phone call from [facility] stating pt will be accepted for [facility] services through [facility]. Start of care will be tomorrow.   *SW called pt wife [name] [phone]) to inform on above. States that she received a call from [facility] and they will establish which date they will come out since pt has a wound care appt tomorrow.   No further SW interventions.

## 2019-12-24 ENCOUNTER — Encounter (HOSPITAL_BASED_OUTPATIENT_CLINIC_OR_DEPARTMENT_OTHER): Payer: Medicare Other | Admitting: Physician Assistant

## 2019-12-25 ENCOUNTER — Telehealth: Payer: Self-pay

## 2019-12-25 NOTE — Telephone Encounter (Signed)
The daughter of [name] is a 79 y.o. male called in requesting a new rx for Baclofen 20mg  refill tat was prescribed my the hospital.  Pt is requesting a call back from the nurse.

## 2019-12-26 ENCOUNTER — Other Ambulatory Visit: Payer: Self-pay | Admitting: Neurology

## 2019-12-26 MED ORDER — BACLOFEN 20 MG PO TABS: 40.0000 mg | ORAL_TABLET | Freq: Every day | ORAL | 1 refills | Status: DC

## 2019-12-26 MED ORDER — BACLOFEN 10 MG PO TABS: 30.0000 mg | ORAL_TABLET | Freq: Three times a day (TID) | ORAL | 0 refills | Status: DC

## 2019-12-26 NOTE — Telephone Encounter (Signed)
Follow up apt made for the patient for the first available visit for 8/19. Advised 1 mth supply would be sent in for the patient until they can follow up with Dr Brett Fairy.

## 2019-12-29 ENCOUNTER — Telehealth: Payer: Self-pay

## 2019-12-29 NOTE — Telephone Encounter (Signed)
[name]  PT will start visits next week for PT. Per [name] the family has been informed.

## 2019-12-30 NOTE — Telephone Encounter (Signed)
Error. Medication already refilled.

## 2020-01-01 ENCOUNTER — Encounter (HOSPITAL_BASED_OUTPATIENT_CLINIC_OR_DEPARTMENT_OTHER): Payer: Medicare Other | Attending: Internal Medicine | Admitting: Internal Medicine

## 2020-01-01 DIAGNOSIS — L89520 Pressure ulcer of left ankle, unstageable: Secondary | ICD-10-CM | POA: Diagnosis not present

## 2020-01-01 DIAGNOSIS — M1990 Unspecified osteoarthritis, unspecified site: Secondary | ICD-10-CM | POA: Insufficient documentation

## 2020-01-01 DIAGNOSIS — J449 Chronic obstructive pulmonary disease, unspecified: Secondary | ICD-10-CM | POA: Diagnosis not present

## 2020-01-01 DIAGNOSIS — I509 Heart failure, unspecified: Secondary | ICD-10-CM | POA: Insufficient documentation

## 2020-01-01 DIAGNOSIS — H42 Glaucoma in diseases classified elsewhere: Secondary | ICD-10-CM | POA: Diagnosis not present

## 2020-01-01 DIAGNOSIS — L89150 Pressure ulcer of sacral region, unstageable: Secondary | ICD-10-CM | POA: Insufficient documentation

## 2020-01-01 DIAGNOSIS — M109 Gout, unspecified: Secondary | ICD-10-CM | POA: Diagnosis not present

## 2020-01-01 DIAGNOSIS — I110 Hypertensive heart disease with heart failure: Secondary | ICD-10-CM | POA: Diagnosis not present

## 2020-01-01 DIAGNOSIS — L89893 Pressure ulcer of other site, stage 3: Secondary | ICD-10-CM | POA: Diagnosis not present

## 2020-01-01 DIAGNOSIS — G8220 Paraplegia, unspecified: Secondary | ICD-10-CM | POA: Diagnosis not present

## 2020-01-01 DIAGNOSIS — E1139 Type 2 diabetes mellitus with other diabetic ophthalmic complication: Secondary | ICD-10-CM | POA: Diagnosis not present

## 2020-01-01 DIAGNOSIS — E1151 Type 2 diabetes mellitus with diabetic peripheral angiopathy without gangrene: Secondary | ICD-10-CM | POA: Diagnosis not present

## 2020-01-01 DIAGNOSIS — E11621 Type 2 diabetes mellitus with foot ulcer: Secondary | ICD-10-CM | POA: Insufficient documentation

## 2020-01-01 DIAGNOSIS — L97518 Non-pressure chronic ulcer of other part of right foot with other specified severity: Secondary | ICD-10-CM | POA: Insufficient documentation

## 2020-01-02 ENCOUNTER — Other Ambulatory Visit: Payer: Self-pay

## 2020-01-02 ENCOUNTER — Ambulatory Visit (HOSPITAL_COMMUNITY)
Admission: RE | Admit: 2020-01-02 | Discharge: 2020-01-02 | Disposition: A | Discharge status: Home / Self Care | Payer: Medicare Other | Source: Ambulatory Visit | Attending: Internal Medicine | Admitting: Internal Medicine

## 2020-01-02 ENCOUNTER — Other Ambulatory Visit (HOSPITAL_COMMUNITY): Payer: Self-pay | Admitting: Internal Medicine

## 2020-01-02 DIAGNOSIS — L89150 Pressure ulcer of sacral region, unstageable: Secondary | ICD-10-CM

## 2020-01-02 LAB — CBC WITH DIFFERENTIAL/PLATELET
Abs Immature Granulocytes: 0.07 10*3/uL (ref 0.00–0.07)
Basophils Absolute: 0.1 10*3/uL (ref 0.0–0.1)
Basophils Relative: 1 %
Eosinophils Absolute: 0.6 10*3/uL — ABNORMAL HIGH (ref 0.0–0.5)
Eosinophils Relative: 6 %
HCT: 36.9 % — ABNORMAL LOW (ref 39.0–52.0)
Hemoglobin: 11.8 g/dL — ABNORMAL LOW (ref 13.0–17.0)
Immature Granulocytes: 1 %
Lymphocytes Relative: 12 %
Lymphs Abs: 1.2 10*3/uL (ref 0.7–4.0)
MCH: 27.6 pg (ref 26.0–34.0)
MCHC: 32 g/dL (ref 30.0–36.0)
MCV: 86.4 fL (ref 80.0–100.0)
Monocytes Absolute: 0.9 10*3/uL (ref 0.1–1.0)
Monocytes Relative: 9 %
Neutro Abs: 7.2 10*3/uL (ref 1.7–7.7)
Neutrophils Relative %: 71 %
Platelets: 313 10*3/uL (ref 150–400)
RBC: 4.27 MIL/uL (ref 4.22–5.81)
RDW: 15.5 % (ref 11.5–15.5)
WBC: 10 10*3/uL (ref 4.0–10.5)
nRBC: 0 % (ref 0.0–0.2)

## 2020-01-02 LAB — COMPREHENSIVE METABOLIC PANEL
ALT: 8 U/L (ref 0–44)
AST: 13 U/L — ABNORMAL LOW (ref 15–41)
Albumin: 3.3 g/dL — ABNORMAL LOW (ref 3.5–5.0)
Alkaline Phosphatase: 71 U/L (ref 38–126)
Anion gap: 11 (ref 5–15)
BUN: 25 mg/dL — ABNORMAL HIGH (ref 8–23)
CO2: 29 mmol/L (ref 22–32)
Calcium: 9 mg/dL (ref 8.9–10.3)
Chloride: 103 mmol/L (ref 98–111)
Creatinine, Ser: 0.85 mg/dL (ref 0.61–1.24)
GFR calc Af Amer: >60 mL/min (ref >60)
GFR calc non Af Amer: >60 mL/min (ref >60)
Glucose, Bld: 149 mg/dL — ABNORMAL HIGH (ref 70–99)
Potassium: 3.8 mmol/L (ref 3.5–5.1)
Sodium: 143 mmol/L (ref 135–145)
Total Bilirubin: 0.5 mg/dL (ref 0.3–1.2)
Total Protein: 7.2 g/dL (ref 6.5–8.1)

## 2020-01-02 LAB — SEDIMENTATION RATE: Sed Rate: 63 mm/hr — ABNORMAL HIGH (ref 0–16)

## 2020-01-02 LAB — C-REACTIVE PROTEIN: CRP: 9.8 mg/dL — ABNORMAL HIGH (ref <1.0)

## 2020-01-02 IMAGING — DX DG SACRUM/COCCYX 2+V
3 series · 3 of 3 positions shown · non-contrast
Comparison: Lumbar radiographs [DATE]

CLINICAL DATA: Unstageable pressure ulcer of the sacral region

EXAM:
SACRUM AND COCCYX - 2+ VIEW

[coccyx ap]
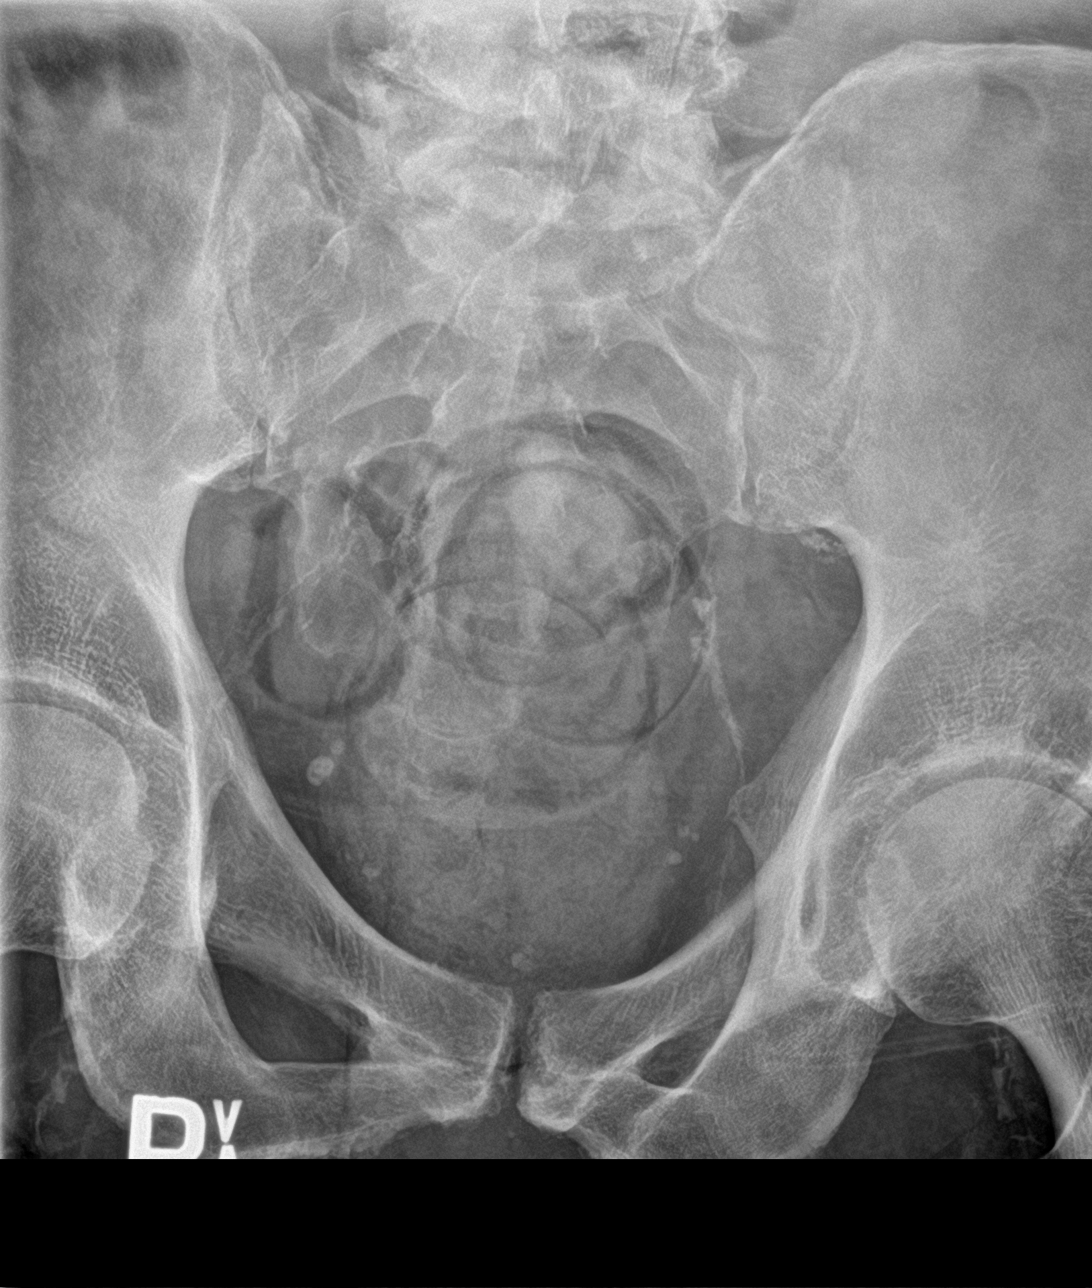

[sacrum ap]
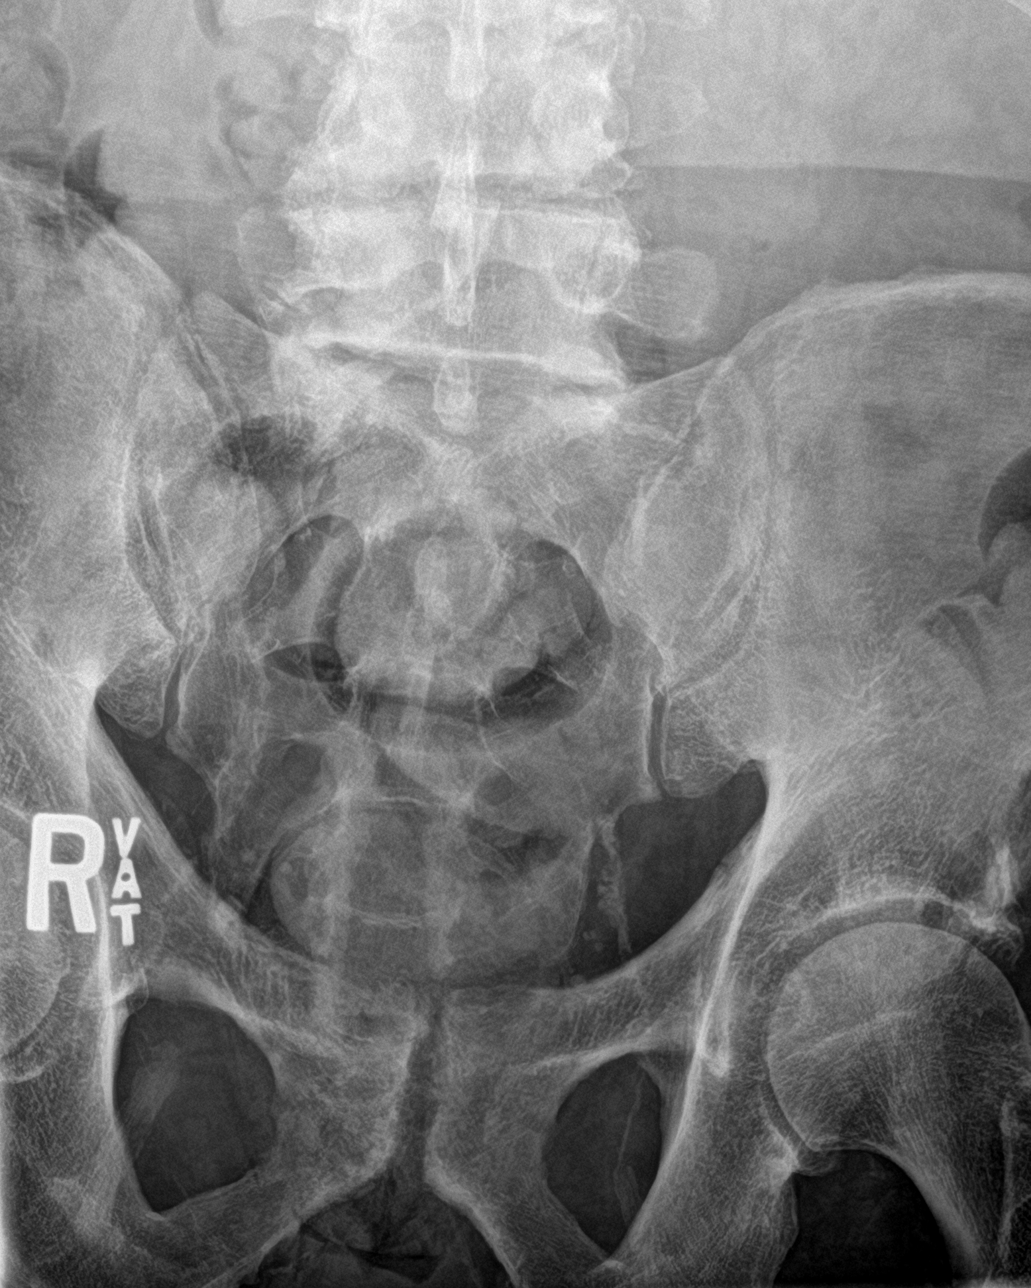

[sacrum lat]
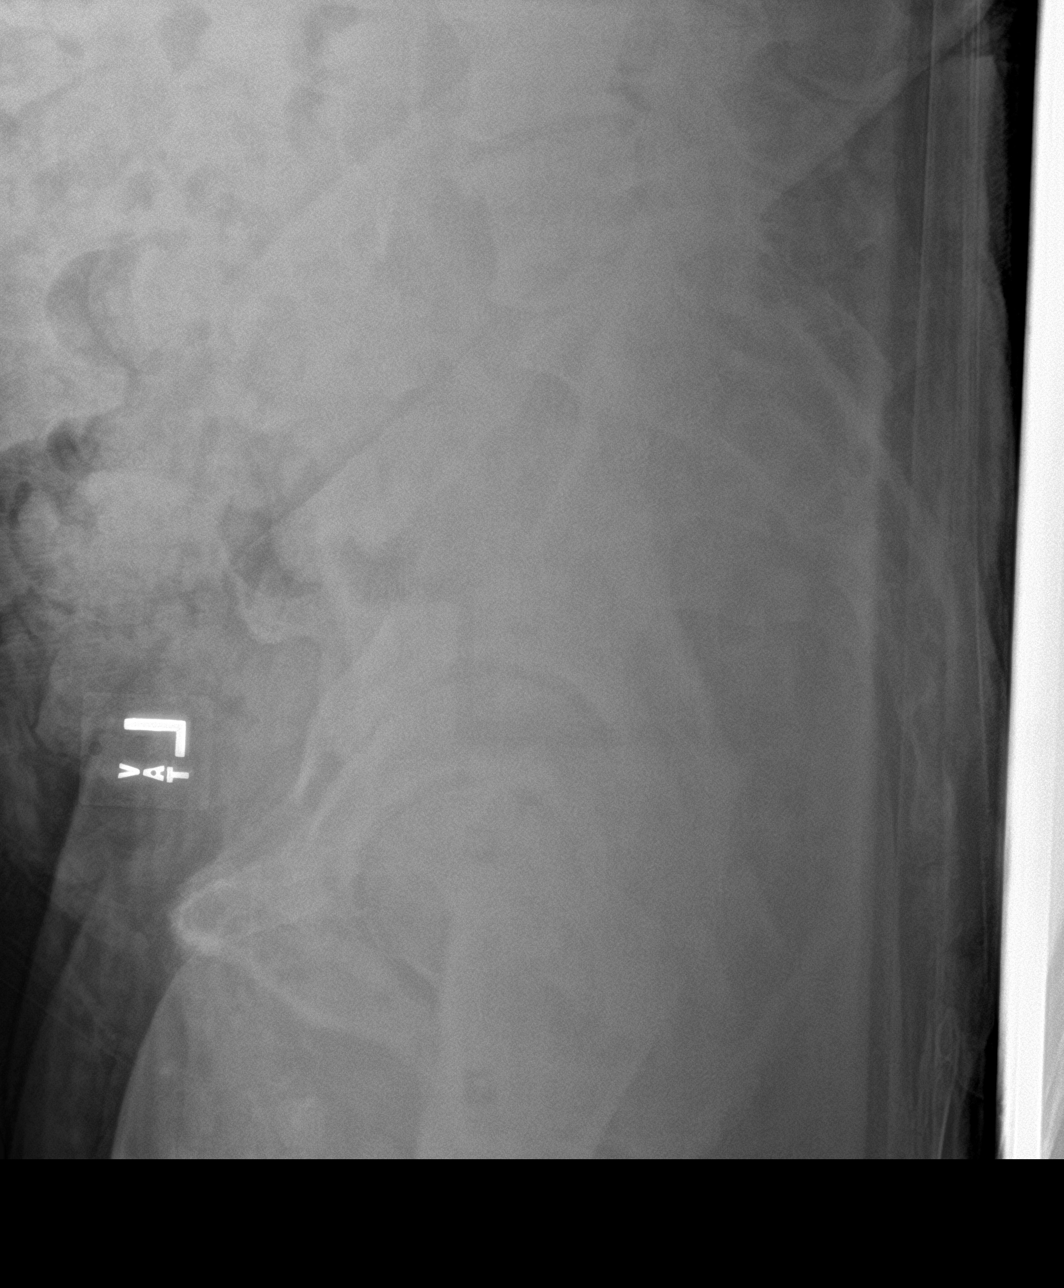

[3 of 3 positions shown; findings below may reference images not displayed]

FINDINGS: Suboptimal image quality due to patient body habitus with poor
visualization of the known decubitus ulceration in the subjacent
coccygeal segments on the lateral radiograph. Additional
degenerative changes noted throughout the lower lumbar spine, hips
and SI joints as well as the pubic symphysis. Vascular calcium in
the pelvis. Soft tissues are otherwise free of acute abnormality.
IMPRESSION: 1. Suboptimal image quality due to patient body habitus resulting in
markedly poor visualization of the known decubitus ulceration and
the subjacent coccygeal segments on the lateral radiograph. If there
is clinical concern, cross-sectional imaging should be obtained.

## 2020-01-05 NOTE — Progress Notes (Signed)
[name], [name] ([id number]) Visit Report for 01/01/2020 HPI Details Patient Name: Date of Service: [name] 01/01/2020 2:45 PM Medical Record Number: [id number] Patient Account Number: [id number] Date of Birth/Sex: Treating RN: November 30, 1940 (79 y.o. [name] Primary Care Provider: [name] Other Clinician: Referring Provider: Treating Provider/Extender: [name] in Treatment: 3 History of Present Illness HPI Description: 12/10/2019 upon evaluation today patient appears for initial evaluation here in our clinic unfortunately due to having multiple pressure injuries noted at this point. Including in the sacral region, left ankle, and Achilles region. He also has an ulcer on top of his foot but this appears to be mainly dry I do not even know that there is really a major issue here but nonetheless we are going to continue to monitor this and to the eschar pops off. He still is having some pain he also had apparently some infection around this area that his primary care provider placed him on antibiotic for. The patient does have hypertension and also muscle weakness that came on all of a sudden at the beginning of this year around March and unfortunately he has not been able to walk since that time prior to that he was walking with some weakness due to spinal stenosis but nothing like what he is experiencing right now. He does spend a lot of his day sitting though he does reposition in his chair I am not sure if that is sufficient or not we will have to see how things progress. Nonetheless the sacral wound is definitely the worst part of everything that he has at this point. He does have diabetes which is very well controlled most recent hemoglobin A1c was 5.4. 12/17/2019 upon evaluation today patient appears to be doing decently well in regard to several of his ulcers on the foot and ankle region. I am very pleased with this. The Achilles area is  also doing well. In fact the dorsal surface of the foot is completely healed and this is great news. The sacral region however is another story he tends to sit in his chair most of the day leaning back and I think this is putting pressure on this area even though he is trying to get in a position that is comfortable for him unfortunately I think this is leading to him having more issues with skin breakdown and I think this wound is good to get worse not better if were not careful. I explained that to the patient and his wife today during the evaluation. 7/15; patient I have not seen previously. 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 He also has an area on the left lateral malleolus and the Achilles area. The area on his left lateral malleolus and Achilles are doing well using silver collagen He spent a protracted period of time in the hospital back and forth I think between the rehab unit largely related to lower back and cervical spine issues. He is no longer ambulatory. During this time he developed a pressure ulcer on his lower sacrum. When he came here this was unstageable it is now more clean wound with clearly exposed bone. It has a very distinct odor and apparently that is been true on previous visits as well. Last albumin ICU was 3.4. He is religiously turning and offloading this area spending most of his time in bed. He is apparently eating well Electronic Signature(s) Signed: 01/01/2020 5:51:19 PM  By: [name] MD Entered By: [name] on 01/01/2020 16:15:06 -------------------------------------------------------------------------------- Physical Exam Details Patient Name: Date of Service: MA [facility], [name] 01/01/2020 2:45 PM Medical Record Number: [id number] Patient Account Number: [id number] Date of Birth/Sex: Treating RN: 1940/09/04 (79 y.o. [name] Primary Care Provider: [name] Other Clinician: Referring  Provider: Treating Provider/Extender: [name] in Treatment: 3 Constitutional Patient is hypertensive. However he appears well. Pulse regular and within target range for patient.[name] Respirations regular, non-labored and within target range.. Temperature is normal and within the target range for the patient.[name] Appears in no distress. Genitourinary (GU) Foley catheter. Notes Wound exam The sacral wound is larger undermines there is palpable bone. Most of this is now fairly good looking tissue there is still some debris on the surface inferiorly. No evidence of surrounding soft tissue infection there is a very pungent odor to this The left lateral malleolus looks healthy. As does the area on the Achilles. Electronic Signature(s) Signed: 01/01/2020 5:51:19 PM By: [name] MD Entered By: [name] on 01/01/2020 16:16:07 -------------------------------------------------------------------------------- Physician Orders Details Patient Name: Date of Service: MA [facility], [name] 01/01/2020 2:45 PM Medical Record Number: [id number] Patient Account Number: [id number] Date of Birth/Sex: Treating RN: 1940/10/29 (79 y.o. 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 - ****[facility] - ROOM 5**** Return A Dressing Change Frequency Wound #1 Sacrum Change  dressing every day. Wound #2 Left,Lateral Malleolus Change Dressing every other day. Wound #3 Right Achilles Change Dressing every other day. Wound Cleansing Wound #1 Sacrum Clean wound with Wound Cleanser Wound #2 Left,Lateral Malleolus May shower and wash wound with soap and water. Wound #3 Right Achilles May shower and wash wound with soap and water. Primary Wound Dressing Wound #1 Sacrum Other: - Vashe moistened gauze packing. Use Anasept gel in clinic. Wound #2 Left,Lateral Malleolus Silver Collagen - moisten with saline Wound #3 Right Achilles Silver Collagen - moisten with saline Secondary Dressing Wound #1 Sacrum ABD pad - or foam border Wound #2 Left,Lateral Malleolus Foam Border - or large bandaid Wound #3 Right Achilles Foam Border - or large bandaid Off-Loading Multipodus Splint to: - or bunny boots to both feet especially when in bed Low air-loss mattress (Group 2) Turn and reposition every 2 hours [place] for wound care. - [facility] CBC W A Differential panel in Blood (HEM-CBC) - (ICD10 E11.621 - Type 2 diabetes mellitus with foot ulcer) uto LOINC Code: [phone] Convenience Name: CBC W Auto Differential panel Comprehensive 2000 panel in Serum or Plasma (CHEM-panel) - (ICD10 E11.621 - Type 2 diabetes mellitus with foot ulcer) LOINC Code: 11941-7 Convenience Name: Comprehensive metabolic panel=CMS C reactive protein [Mass/volume] in Serum or Plasma (CHEM) - (ICD10 E11.621 - Type 2 diabetes mellitus with foot ulcer) LOINC Code: 1988-5 Convenience Name: C Reactive Protein in serum or plasma Erythrocyte sedimentation rate (HEM) - (ICD10 E11.621 - Type 2 diabetes mellitus with foot ulcer) LOINC Code: 40814-4 Convenience Name: Sed rate-method  unspecified Radiology X-ray, coccyx - Non healing stage 4 ulcer on sacrum - (ICD10 L89.150 - Pressure ulcer of sacral region, unstageable) Electronic Signature(s) Signed:  01/05/2020 8:03:52 AM By: [name] MD Signed: 01/05/2020 4:12:59 PM By: [name] RN, BSN Previous Signature: 01/01/2020 5:51:19 PM Version By: [name] MD Entered By: [name] on 01/02/2020 09:18:34 Prescription 01/01/2020 -------------------------------------------------------------------------------- Forget, Alinda Money MD Patient Name: Provider: 1941/03/07 [id number] Date of Birth: NPI#: Jerilynn Mages [id number] Sex: DEA #: [phone] Phone #: License #: [place] Patient Address: [place], [place] [address] [place], [place] [address] [phone] Allergies Name Reaction Severity No Known Drug Allergies Provider's Orders X-ray, coccyx - ICD10: L89.150 - Non healing stage 4 ulcer on sacrum Hand Signature: Date(s): Prescription 01/01/2020 Stahly, Alinda Money MD Patient Name: Provider: August 10, 1940 [id number] Date of Birth: NPI#: Jerilynn Mages [id number] Sex: DEA #: [phone] Phone #: License #: [place] Patient Address: [address] [place], [place] [address] [place], [place] [address] [phone] Allergies Name Reaction Severity No Known Drug Allergies Provider's Orders CBC W A Differential panel in Blood - ICD10: E11.621 uto LOINC Code: 51025-8 Convenience Name: CBC W Auto Differential panel Hand Signature: Date(s): Prescription 01/01/2020 Laubach, Alinda Money MD Patient Name: Provider: 26-Apr-1941 [id number] Date of Birth: NPI#: Jerilynn Mages [id number] Sex: DEA #: [phone] Phone #: License #: [place] Patient Address: [place], [place] [address] [place], [place] [address] [phone] Allergies Name Reaction Severity No Known Drug Allergies Provider's Orders Comprehensive 2000  panel in Serum or Plasma - ICD10: E11.621 LOINC Code: 25053-9 Convenience Name: Comprehensive metabolic panel=CMS Hand Signature: Date(s): Prescription 01/01/2020 Meyn, Alinda Money MD Patient Name: Provider: [birth year] [id number] Date of Birth: NPI#: Jerilynn Mages [id number] Sex: DEA #: [phone] Phone #: License #: [place] Patient Address: [place], [place] [address] [place], [place] [address] [phone] Allergies Name Reaction Severity No Known Drug Allergies Provider's Orders C reactive protein [Mass/volume] in Serum or Plasma - ICD10: E11.621 LOINC Code: 1988-5 Convenience Name: C Reactive Protein in serum or plasma Hand Signature: Date(s): Prescription 01/01/2020 Morandi, Alinda Money MD Patient Name: Provider: 1940/10/24 [id number] Date of Birth: NPI#: Jerilynn Mages [id number] Sex: DEA #: [phone] Phone #: License #: [place] Patient Address: [place] [address] [place], [place] [address] [place], [place] [address] [phone] Allergies Name Reaction Severity No Known Drug Allergies Provider's Orders Erythrocyte sedimentation rate - ICD10: E11.621 LOINC Code: 09628-3 Convenience Name: Sed rate-method unspecified Hand Signature: Date(s): Electronic Signature(s) Signed: 01/05/2020 8:03:52 AM By: [name] MD Signed: 01/05/2020 4:12:59 PM By: [name] RN, BSN Previous Signature: 01/01/2020 5:51:19 PM Version By: [name] MD Entered By: [name] on 01/02/2020 09:18:36 -------------------------------------------------------------------------------- Problem List Details Patient Name: Date of Service: MA [facility], [name] 01/01/2020 2:45 PM Medical Record Number: [id number] Patient Account Number: [id number] Date of Birth/Sex: Treating RN: 1941-04-02 (79 y.o. [name] Primary Care Provider: [name] Other Clinician: Referring Provider: Treating Provider/Extender: [name] in Treatment: 3 Active Problems ICD-10 Encounter Code Description Active Date MDM Diagnosis L89.150 Pressure ulcer  of sacral region, unstageable 12/10/2019 No Yes L89.520 Pressure ulcer of left ankle, unstageable 12/10/2019 No Yes L89.893 Pressure ulcer of other site, stage 3 12/10/2019 No Yes E11.621 Type 2 diabetes mellitus with foot ulcer 12/10/2019 No Yes L97.518 Non-pressure chronic ulcer of other part of right foot with other specified 12/10/2019 No Yes severity I10 Essential (primary) hypertension 12/10/2019 No Yes M62.81 Muscle weakness (generalized) 12/10/2019 No Yes Inactive Problems Resolved Problems Electronic Signature(s) Signed: 01/01/2020 5:51:19 PM By: [name] MD Entered By: [name] on 01/01/2020 16:12:57 -------------------------------------------------------------------------------- Progress Note Details Patient Name: Date of Service: MA [facility], [place] HN M. 01/01/2020 2:45 PM Medical Record Number: [id number] Patient Account Number: [id number] Date of Birth/Sex: Treating RN: 09-26-1940 (79 y.o. [name] Primary Care Provider: [name] Other Clinician: Referring Provider: Treating Provider/Extender: [name] in Treatment: 3 Subjective History of Present Illness (HPI) 12/10/2019 upon evaluation today patient appears for initial evaluation here in our clinic unfortunately due to having multiple pressure injuries noted at this point. Including in the sacral region, left ankle, and Achilles region. He also has an ulcer on top of his foot but this appears to be mainly dry I do not even know that there is really a major issue here but nonetheless we are going to continue to monitor this and to the eschar pops off. He still is having some pain he also had apparently some  infection around this area that his primary care provider placed him on antibiotic for. The patient does have hypertension and also muscle weakness that came on all of a sudden at the beginning of this year around March and unfortunately he has not been able to walk since that time prior to that he was walking with some weakness due to spinal stenosis but nothing like what he is experiencing right now. He does spend a lot of his day sitting though he does reposition in his chair I am not sure if that is sufficient or not we will have to see how things progress. Nonetheless the sacral wound is definitely the worst part of everything that he has at this point. He does have diabetes which is very well controlled most recent hemoglobin A1c was 5.4. 12/17/2019 upon evaluation today patient appears to be doing decently well in regard to several of his ulcers on the foot and ankle region. I am very pleased with this. The Achilles area is also doing well. In fact the dorsal surface of the foot is completely healed and this is great news. The sacral region however is another story he tends to sit in his chair most of the day leaning back and I think this is putting pressure on this area even though he is trying to get in a position that is comfortable for him unfortunately I think this is leading to him having more issues with skin breakdown and I think this wound is good to get worse not better if were not careful. I explained that to the patient and his wife today during the evaluation. 7/15; patient I have not seen previously. 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 He also has an area on the left lateral malleolus and the Achilles area. The area on his left lateral malleolus and Achilles are doing well using silver collagen He spent a protracted period of time in the hospital back and forth I think between the rehab unit largely related to lower back and  cervical  spine issues. He is no longer ambulatory. During this time he developed a pressure ulcer on his lower sacrum. When he came here this was unstageable it is now more clean wound with clearly exposed bone. It has a very distinct odor and apparently that is been true on previous visits as well. Last albumin ICU was 3.4. He is religiously turning and offloading this area spending most of his time in bed. He is apparently eating well Objective Constitutional Patient is hypertensive. However he appears well. Pulse regular and within target range for patient.[name] Respirations regular, non-labored and within target range.. Temperature is normal and within the target range for the patient.[name] Appears in no distress. Vitals Time Taken: 3:30 PM, Height: 67 in, Weight: 211 lbs, BMI: 33, Temperature: 98.1 F, Pulse: 80 bpm, Respiratory Rate: 19 breaths/min, Blood Pressure: 96/63 mmHg. Genitourinary (GU) Foley catheter. General Notes: Wound exam ooThe sacral wound is larger undermines there is palpable bone. Most of this is now fairly good looking tissue there is still some debris on the surface inferiorly. No evidence of surrounding soft tissue infection there is a very pungent odor to this ooThe left lateral malleolus looks healthy. As does the area on the Achilles. Integumentary (Hair, Skin) Wound #1 status is Open. Original cause of wound was Pressure Injury. The wound is located on the Sacrum. The wound measures 6.2cm length x 4.5cm width x 2.7cm depth; 21.913cm^2 area and 59.164cm^3 volume. There is bone, muscle, and Fat Layer (Subcutaneous Tissue) Exposed exposed. There is no tunneling noted, however, there is undermining starting at 11:00 and ending at 5:00 with a maximum distance of 4cm. There is a medium amount of purulent drainage noted. Foul odor after cleansing was noted. The wound margin is well defined and not attached to the wound base. There is medium (34-66%) red, pink granulation within the  wound bed. There is a medium (34-66%) amount of necrotic tissue within the wound bed including Eschar and Adherent Slough. Wound #2 status is Open. Original cause of wound was Pressure Injury. The wound is located on the Left,Lateral Malleolus. The wound measures 0.1cm length x 0.1cm width x 0.1cm depth; 0.008cm^2 area and 0.001cm^3 volume. There is no tunneling or undermining noted. There is a none present amount of drainage noted. The wound margin is flat and intact. There is no granulation within the wound bed. There is no necrotic tissue within the wound bed. Wound #3 status is Open. Original cause of wound was Pressure Injury. The wound is located on the Right Achilles. The wound measures 0.8cm length x 0.4cm width x 0.1cm depth; 0.251cm^2 area and 0.025cm^3 volume. There is Fat Layer (Subcutaneous Tissue) Exposed exposed. There is no tunneling or undermining noted. There is a medium amount of serosanguineous drainage noted. The wound margin is flat and intact. There is large (67-100%) red granulation within the wound bed. There is no necrotic tissue within the wound bed. 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 - ****[facility] - ROOM 5**** Dressing Change Frequency: Wound #1 Sacrum: Change dressing every day. Wound #2 Left,Lateral Malleolus: Change Dressing every other day. Wound #3 Right Achilles: Change Dressing every other day. Wound Cleansing: Wound #1 Sacrum: Clean wound with Wound  Cleanser Wound #2 Left,Lateral Malleolus: May shower and wash wound with soap and water. Wound #3 Right Achilles: May shower and wash  wound with soap and water. Primary Wound Dressing: Wound #1 Sacrum: Other: - Vashe moistened gauze packing. Use Anasept gel in clinic. Wound #2 Left,Lateral Malleolus: Silver Collagen - moisten with saline Wound #3 Right Achilles: Silver Collagen - moisten with saline Secondary Dressing: Wound #1 Sacrum: ABD pad - or foam border Wound #2 Left,Lateral Malleolus: Foam Border - or large bandaid Wound #3 Right Achilles: Foam Border - or large bandaid Off-Loading: Multipodus Splint to: - or bunny boots to both feet especially when in bed Low air-loss mattress (Group 2) Turn and reposition every 2 hours Home Health: [facility] for wound care. - [facility] ordered were: X-ray, coccyx - Non healing stage 4 ulcer on sacrum Laboratory ordered were: CBC W Auto Differential panel, Comprehensive metabolic panel=CMS, C Reactive Protein in serum or plasma, Sed rate -method unspecified 1. Continue silver collagen to the areas on the left lateral malleolus and the right Achilles 2. I have not changed the Vashe solution 3. The odor coming out of the wound on the sacrum I find concerning. I have gone ahead and ordered lab work including a CMP CBC with differential C- reactive protein and sedimentation rate also plain x-rays of the lumbar sacral area. It would be possible to get a specimen of bone here. All of this as a prelude to possibly using a wound VAC Electronic Signature(s) Signed: 01/05/2020 8:03:52 AM By: [name] MD Signed: 01/05/2020 4:12:59 PM By: [name] RN, BSN Previous Signature: 01/01/2020 5:51:19 PM Version By: [name] MD Entered By: [name] on 01/02/2020 09:19:29 -------------------------------------------------------------------------------- [place] Details Patient Name: Date of Service: MA [facility], [name] 01/01/2020 Medical Record Number: [id number] Patient Account Number: [id number] Date of Birth/Sex: Treating  RN: 07-20-1940 (79 y.o. 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 I10 Essential (primary) hypertension M62.81 Muscle weakness (generalized) Facility Procedures CPT4 Code: [id number] Description: 99214 - WOUND CARE VISIT-LEV 4 EST PT Modifier: Quantity: 1 Physician Procedures : CPT4 Code Description Modifier [id number] - WC PHYS LEVEL 3 - EST PT ICD-10 Diagnosis Description 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, unstageable L89.520 Pressure ulcer of left ankle, unstageable L89.893 Pressure ulcer of other site, stage 3  E11.621 Type 2 diabetes mellitus with foot ulcer Quantity: 1 Electronic Signature(s) Signed: 01/05/2020 8:03:52 AM By: [name] MD Signed: 01/05/2020 4:12:59 PM By: [name] RN, BSN Previous Signature: 01/01/2020 5:51:19 PM Version By: [name] MD Entered By: [name] on 01/02/2020 09:22:25

## 2020-01-05 NOTE — Progress Notes (Signed)
[name], [name] ([id number]) Visit Report for 01/01/2020 Arrival Information Details Patient Name: Date of Service: MA [name] [name] 01/01/2020 2:45 PM Medical Record Number: [id number] Patient Account Number: [id number] Date of Birth/Sex: Treating RN: 16-May-1941 (79 y.o. Marvis Repress Primary Care Lennie Vasco: [name] Other Clinician: Referring Betul Brisky: Treating Yandell Mcjunkins/Extender: [name] in Treatment: 3 Visit Information History Since Last Visit Added or deleted any medications: No Patient Arrived: Wheel Chair Any new allergies or adverse reactions: No Arrival Time: 15:30 Had a fall or experienced change in No Accompanied By: family member activities of daily living that may affect Transfer Assistance: Harrel Lemon Lift risk of falls: Patient Identification Verified: Yes Signs or symptoms of abuse/neglect since last visito No Secondary Verification Process Completed: Yes Hospitalized since last visit: No Patient Has Alerts: Yes Implantable device outside of the clinic excluding No Patient Alerts: R ABI: 0.89 TBI: 0.64 cellular tissue based products placed in the center L ABI: 0.62 (01/2019) since last visit: Has Dressing in Place as Prescribed: Yes Has Compression in Place as Prescribed: Yes Pain Present Now: No Electronic Signature(s) Signed: 01/01/2020 5:52:12 PM By: Kela Millin Entered By: Kela Millin on 01/01/2020 15:31:28 -------------------------------------------------------------------------------- Clinic Level of Care Assessment Details Patient Name: Date of Service: MA [facility] M. 01/01/2020 2:45 PM Medical Record Number: [id number] Patient Account Number: [id number] Date of Birth/Sex: Treating RN: April 09, 1941 (79 y.o. [name] Primary Care Lynx Goodrich: [name] Other Clinician: Referring Harless Molinari: Treating [name]/Extender: [name] in Treatment: 3 Clinic Level of Care  Assessment Items TOOL 4 Quantity Score X- 1 0 Use when only an EandM is performed on FOLLOW-UP visit ASSESSMENTS - Nursing Assessment / Reassessment X- 1 10 Reassessment of Co-morbidities (includes updates in patient status) X- 1 5 Reassessment of Adherence to Treatment Plan ASSESSMENTS - Wound and Skin A ssessment / Reassessment []  - 0 Simple Wound Assessment / Reassessment - one wound X- 3 5 Complex Wound Assessment / Reassessment - multiple wounds []  - 0 Dermatologic / Skin Assessment (not related to wound area) ASSESSMENTS - Focused Assessment []  - 0 Circumferential Edema Measurements - multi extremities []  - 0 Nutritional Assessment / Counseling / Intervention X- 1 5 Lower Extremity Assessment (monofilament, tuning fork, pulses) []  - 0 Peripheral Arterial Disease Assessment (using hand held doppler) ASSESSMENTS - Ostomy and/or Continence Assessment and Care []  - 0 Incontinence Assessment and Management []  - 0 Ostomy Care Assessment and Management (repouching, etc.) PROCESS - Coordination of Care X - Simple Patient / Family Education for ongoing care 1 15 []  - 0 Complex (extensive) Patient / Family Education for ongoing care X- 1 10 Staff obtains Programmer, systems, Records, T Results / Process Orders est X- 1 10 Staff telephones HHA, Nursing Homes / Clarify orders / etc []  - 0 Routine Transfer to another Facility (non-emergent condition) []  - 0 Routine Hospital Admission (non-emergent condition) []  - 0 New Admissions / Biomedical engineer / Ordering NPWT Apligraf, etc. , []  - 0 Emergency Hospital Admission (emergent condition) X- 1 10 Simple Discharge Coordination []  - 0 Complex (extensive) Discharge Coordination PROCESS - Special Needs []  - 0 Pediatric / Minor Patient Management []  - 0 Isolation Patient Management []  - 0 Hearing / Language / Visual special needs []  - 0 Assessment of Community assistance (transportation, D/C planning, etc.) []  -  0 Additional assistance / Altered mentation []  - 0 Support Surface(s) Assessment (bed, cushion, seat, etc.) INTERVENTIONS - Wound Cleansing / Measurement []  - 0 Simple  Wound Cleansing - one wound X- 3 5 Complex Wound Cleansing - multiple wounds X- 1 5 Wound Imaging (photographs - any number of wounds) []  - 0 Wound Tracing (instead of photographs) []  - 0 Simple Wound Measurement - one wound X- 3 5 Complex Wound Measurement - multiple wounds INTERVENTIONS - Wound Dressings X - Small Wound Dressing one or multiple wounds 3 10 []  - 0 Medium Wound Dressing one or multiple wounds []  - 0 Large Wound Dressing one or multiple wounds X- 1 5 Application of Medications - topical []  - 0 Application of Medications - injection INTERVENTIONS - Miscellaneous []  - 0 External ear exam []  - 0 Specimen Collection (cultures, biopsies, blood, body fluids, etc.) []  - 0 Specimen(s) / Culture(s) sent or taken to Lab for analysis []  - 0 Patient Transfer (multiple staff / Civil Service fast streamer / Similar devices) []  - 0 Simple Staple / Suture removal (25 or less) []  - 0 Complex Staple / Suture removal (26 or more) []  - 0 Hypo / Hyperglycemic Management (close monitor of Blood Glucose) []  - 0 Ankle / Brachial Index (ABI) - do not check if billed separately X- 1 5 Vital Signs Has the patient been seen at the hospital within the last three years: Yes Total Score: 155 Level Of Care: New/Established - Level 4 Electronic Signature(s) Signed: 01/05/2020 4:12:59 PM By: [name] RN, BSN Entered By: [name] on 01/02/2020 09:22:12 -------------------------------------------------------------------------------- Encounter Discharge Information Details Patient Name: Date of Service: MA [facility], [name] 01/01/2020 2:45 PM Medical Record Number: [id number] Patient Account Number: [id number] Date of Birth/Sex: Treating RN: July 18, 1940 (79 y.o. [name] Primary Care Adan Baehr: [name] Other  Clinician: Referring [name]: Treating Moncerrath Berhe/Extender: [name] in Treatment: 3 Encounter Discharge Information Items Discharge Condition: Stable Ambulatory Status: Wheelchair Discharge Destination: Home Transportation: Private Auto Accompanied By: son-in-law Schedule Follow-up Appointment: Yes Clinical Summary of Care: Patient Declined Electronic Signature(s) Signed: 01/01/2020 5:47:12 PM By: Baruch Gouty RN, BSN Entered By: Baruch Gouty on 01/01/2020 17:38:30 -------------------------------------------------------------------------------- Lower Extremity Assessment Details Patient Name: Date of Service: MA [facility], [name] 01/01/2020 2:45 PM Medical Record Number: [id number] Patient Account Number: [id number] Date of Birth/Sex: Treating RN: Oct 14, 1940 (79 y.o. Marvis Repress Primary Care Darin Redmann: [name] Other Clinician: Referring Jessia Kief: Treating Kourtland Coopman/Extender: [name] in Treatment: 3 Edema Assessment Assessed: Shirlyn Goltz: No] Patrice Paradise: No] Edema: [Left: No] [Right: No] Calf Left: Right: Point of Measurement: 28 cm From Medial Instep 21 cm 33.8 cm Ankle Left: Right: Point of Measurement: 10 cm From Medial Instep 31 cm 21.8 cm Vascular Assessment Pulses: Dorsalis Pedis Palpable: [Left:Yes] [Right:Yes] Electronic Signature(s) Signed: 01/01/2020 5:52:12 PM By: Kela Millin Entered By: Kela Millin on 01/01/2020 15:32:09 -------------------------------------------------------------------------------- Multi Wound Chart Details Patient Name: Date of Service: MA [facility], [name] 01/01/2020 2:45 PM Medical Record Number: [id number] Patient Account Number: [id number] Date of Birth/Sex: Treating RN: [birth year] (79 y.o. [name] Primary Care Ramsie Ostrander: [name] Other Clinician: Referring Tong Pieczynski: Treating Jamesetta Greenhalgh/Extender: [name] in  Treatment: 3 Vital Signs Height(in): 60 Pulse(bpm): 80 Weight(lbs): 211 Blood Pressure(mmHg): 96/63 Body Mass Index(BMI): 33 Temperature(F): 98.1 Respiratory Rate(breaths/min): 19 Photos: [1:No Photos Sacrum] [2:No Photos Left, Lateral Malleolus] [3:No Photos Right Achilles] Wound Location: [1:Pressure Injury] [2:Pressure Injury] [3:Pressure Injury] Wounding Event: [1:Pressure Ulcer] [2:Pressure Ulcer] [3:Pressure Ulcer] Primary Etiology: [1:N/A] [2:Diabetic Wound/Ulcer of the Lower] [3:Diabetic Wound/Ulcer of the Lower] Secondary Etiology: [1:Glaucoma, Chronic Obstructive] [2:Extremity Glaucoma, Chronic Obstructive] [3:Extremity Glaucoma,  Chronic Obstructive] Comorbid History: [1:Pulmonary Disease (COPD), Sleep Apnea, Congestive Heart Failure, Hypertension, Peripheral Arterial Disease, Type II Diabetes, Gout, Osteoarthritis, Paraplegia, Received Radiation 09/18/2019] [2:Pulmonary Disease (COPD), Sleep Apnea,  Congestive Heart Failure, Hypertension, Peripheral Arterial Disease, Type II Diabetes, Gout, Osteoarthritis, Paraplegia, Received Radiation 11/17/2019] [3:Pulmonary Disease (COPD), Sleep Apnea, Congestive Heart Failure, Hypertension, Peripheral Arterial  Disease, Type II Diabetes, Gout, Osteoarthritis, Paraplegia, Received Radiation 09/18/2019] Date Acquired: [1:3] [2:3] [3:3] Weeks of Treatment: [1:Open] [2:Open] [3:Open] Wound Status: [1:6.2x4.5x2.7] [2:0.1x0.1x0.1] [3:0.8x0.4x0.1] Measurements L x W x D (cm) [1:21.913] [2:0.008] [3:0.251] A (cm) : rea [1:59.164] [2:0.001] [3:0.025] Volume (cm) : [1:0.40%] [2:98.40%] [3:93.60%] % Reduction in A rea: [1:15.90%] [2:98.00%] [3:93.60%] % Reduction in Volume: [1:11] Starting Position 1 (o'clock): [1:5] Ending Position 1 (o'clock): [1:4] Maximum Distance 1 (cm): [1:Yes] [2:No] [3:No] Undermining: [1:Category/Stage IV] [2:Category/Stage III] [3:Category/Stage III] Classification: [1:Medium] [2:None Present] [3:Medium] Exudate A mount:  [1:Purulent] [2:N/A] [3:Serosanguineous] Exudate Type: [1:yellow, brown, green] [2:N/A] [3:red, brown] Exudate Color: [1:Yes] [2:No] [3:No] Foul Odor A Cleansing: [1:fter No] [2:N/A] [3:N/A] Odor A nticipated Due to Product Use: [1:Well defined, not attached] [2:Flat and Intact] [3:Flat and Intact] Wound Margin: [1:Medium (34-66%)] [2:None Present (0%)] [3:Large (67-100%)] Granulation A mount: [1:Red, Pink] [2:N/A] [3:Red] Granulation Quality: [1:Medium (34-66%)] [2:None Present (0%)] [3:None Present (0%)] Necrotic A mount: [1:Eschar, Adherent Slough] [2:N/A] [3:N/A] Necrotic Tissue: [1:Fat Layer (Subcutaneous Tissue)] [2:Fascia: No] [3:Fat Layer (Subcutaneous Tissue)] Exposed Structures: [1:Exposed: Yes Muscle: Yes Bone: Yes Fascia: No Tendon: No Joint: No None] [2:Fat Layer (Subcutaneous Tissue) Exposed: No Tendon: No Muscle: No Joint: No Bone: No Large (67-100%)] [3:Exposed: Yes Fascia: No Tendon: No Muscle: No Joint: No Bone: No Small  (1-33%)] Treatment Notes Electronic Signature(s) Signed: 01/01/2020 5:51:19 PM By: [name] MD Signed: 01/05/2020 4:12:59 PM By: [name] RN, BSN Entered By: [name] on 01/01/2020 16:13:12 -------------------------------------------------------------------------------- Multi-Disciplinary Care Plan Details Patient Name: Date of Service: MA [facility], [name] 01/01/2020 2:45 PM Medical Record Number: [id number] Patient Account Number: [id number] Date of Birth/Sex: Treating RN: 11-10-1940 (79 y.o. [name] Primary Care Angeline Trick: [name] Other Clinician: Referring Breslyn Abdo: Treating Marchello Rothgeb/Extender: [name] in Treatment: 3 Active Inactive Abuse / Safety / Falls / Self Care Management Nursing Diagnoses: Potential for falls Goals: Patient/caregiver will verbalize/demonstrate measures taken to prevent injury and/or falls Date Initiated: 12/10/2019 Target Resolution Date: 01/07/2020 Goal  Status: Active Interventions: Assess fall risk on admission and as needed Assess: immobility, friction, shearing, incontinence upon admission and as needed Notes: Nutrition Nursing Diagnoses: Potential for alteratiion in Nutrition/Potential for imbalanced nutrition Goals: Patient/caregiver will maintain therapeutic glucose control Date Initiated: 12/10/2019 Target Resolution Date: 01/07/2020 Goal Status: Active Interventions: Assess HgA1c results as ordered upon admission and as needed Provide education on elevated blood sugars and impact on wound healing Treatment Activities: Patient referred to Primary Care Physician for further nutritional evaluation : 12/10/2019 Notes: Pressure Nursing Diagnoses: Knowledge deficit related to management of pressures ulcers Potential for impaired tissue integrity related to pressure, friction, moisture, and shear Goals: Patient/caregiver will verbalize understanding of pressure ulcer management Date Initiated: 12/10/2019 Target Resolution Date: 01/07/2020 Goal Status: Active Interventions: Assess: immobility, friction, shearing, incontinence upon admission and as needed Assess offloading mechanisms upon admission and as needed Assess potential for pressure ulcer upon admission and as needed Notes: Wound/Skin Impairment Nursing Diagnoses: Impaired tissue integrity Knowledge deficit related to ulceration/compromised skin integrity Goals: Patient/caregiver will verbalize understanding of skin care regimen Date Initiated: 12/10/2019 Target Resolution Date: 01/07/2020 Goal Status: Active Ulcer/skin  breakdown will have a volume reduction of 30% by week 4 Date Initiated: 12/10/2019 Target Resolution Date: 01/14/2020 Goal Status: Active Interventions: Assess patient/caregiver ability to obtain necessary supplies Assess patient/caregiver ability to perform ulcer/skin care regimen upon admission and as needed Assess ulceration(s) every visit Provide  education on ulcer and skin care Treatment Activities: Skin care regimen initiated : 12/10/2019 Topical wound management initiated : 12/10/2019 Notes: Electronic Signature(s) Signed: 01/05/2020 4:12:59 PM By: [name] RN, BSN Entered By: [name] on 01/02/2020 09:21:23 -------------------------------------------------------------------------------- Pain Assessment Details Patient Name: Date of Service: MA [facility], [name] 01/01/2020 2:45 PM Medical Record Number: [id number] Patient Account Number: [id number] Date of Birth/Sex: Treating RN: 08/04/1940 (79 y.o. Marvis Repress Primary Care Andrina Locken: [name] Other Clinician: Referring Janaria Mccammon: Treating Samaiya Awadallah/Extender: [name] in Treatment: 3 Active Problems Location of Pain Severity and Description of Pain Patient Has Paino No Site Locations Pain Management and Medication Current Pain Management: Electronic Signature(s) Signed: 01/01/2020 5:52:12 PM By: Kela Millin Entered By: Kela Millin on 01/01/2020 15:31:59 -------------------------------------------------------------------------------- Patient/Caregiver Education Details Patient Name: Date of Service: MA [facility] M. 7/15/2021andnbsp2:45 PM Medical Record Number: [id number] Patient Account Number: [id number] Date of Birth/Gender: Treating RN: 1940/09/10 (79 y.o. [name] Primary Care Physician: [name] Other Clinician: Referring Physician: Treating Physician/Extender: [name] in Treatment: 3 Education Assessment Education Provided To: Patient Education Topics Provided Wound/Skin Impairment: Methods: Explain/Verbal Responses: State content correctly Electronic Signature(s) Signed: 01/05/2020 4:12:59 PM By: [name] RN, BSN Entered By: [name] on 01/02/2020  09:21:36 -------------------------------------------------------------------------------- Wound Assessment Details Patient Name: Date of Service: MA [facility], [name] 01/01/2020 2:45 PM Medical Record Number: [id number] Patient Account Number: [id number] Date of Birth/Sex: Treating RN: 1941-03-12 (79 y.o. Marvis Repress Primary Care Nero Sawatzky: [name] Other Clinician: Referring Madisson Kulaga: Treating Arlynn Stare/Extender: [name] in Treatment: 3 Wound Status Wound Number: 1 Primary Pressure Ulcer Etiology: Wound Location: Sacrum Wound Open Wounding Event: Pressure Injury Status: Date Acquired: 09/18/2019 Comorbid Glaucoma, Chronic Obstructive Pulmonary Disease (COPD), Sleep Weeks Of Treatment: 3 History: Apnea, Congestive Heart Failure, Hypertension, Peripheral Arterial Clustered Wound: No Disease, Type II Diabetes, Gout, Osteoarthritis, Paraplegia, Received Radiation Photos Photo Uploaded By: Mikeal Hawthorne on 01/01/2020 16:41:34 Wound Measurements Length: (cm) 6.2 Width: (cm) 4.5 Depth: (cm) 2.7 Area: (cm) 21.913 Volume: (cm) 59.164 % Reduction in Area: 0.4% % Reduction in Volume: 15.9% Epithelialization: None Tunneling: No Undermining: Yes Starting Position (o'clock): 11 Ending Position (o'clock): 5 Maximum Distance: (cm) 4 Wound Description Classification: Category/Stage IV Wound Margin: Well defined, not attached Exudate Amount: Medium Exudate Type: Purulent Exudate Color: yellow, brown, green Foul Odor After Cleansing: Yes Due to Product Use: No Slough/Fibrino Yes Wound Bed Granulation Amount: Medium (34-66%) Exposed Structure Granulation Quality: Red, Pink Fascia Exposed: No Necrotic Amount: Medium (34-66%) Fat Layer (Subcutaneous Tissue) Exposed: Yes Necrotic Quality: Eschar, Adherent Slough Tendon Exposed: No Muscle Exposed: Yes Necrosis of Muscle: No Joint Exposed: No Bone Exposed: Yes Treatment Notes Wound #1  (Sacrum) 2. Periwound Care Skin Prep 3. Primary Dressing Applied Other primary dressing (specifiy in notes) 4. Secondary Dressing Foam Border Dressing Notes packed with anasept gel moistened gauze Electronic Signature(s) Signed: 01/01/2020 5:52:12 PM By: Kela Millin Entered By: Kela Millin on 01/01/2020 15:33:07 -------------------------------------------------------------------------------- Wound Assessment Details Patient Name: Date of Service: MA [facility], [name] 01/01/2020 2:45 PM Medical Record Number: [id number] Patient Account Number: [id number] Date of Birth/Sex: Treating RN: 05/25/1941 (79 y.o. Gay Filler, Larene Beach  Primary Care Wenceslaus Gist: [name] Other Clinician: Referring Aasiyah Auerbach: Treating Kirat Mezquita/Extender: [name] in Treatment: 3 Wound Status Wound Number: 2 Primary Pressure Ulcer Etiology: Wound Location: Left, Lateral Malleolus Secondary Diabetic Wound/Ulcer of the Lower Extremity Wounding Event: Pressure Injury Etiology: Date Acquired: 11/17/2019 Wound Open Weeks Of Treatment: 3 Status: Clustered Wound: No Comorbid Glaucoma, Chronic Obstructive Pulmonary Disease (COPD), Sleep History: Apnea, Congestive Heart Failure, Hypertension, Peripheral Arterial Disease, Type II Diabetes, Gout, Osteoarthritis, Paraplegia, Received Radiation Photos Photo Uploaded By: Mikeal Hawthorne on 01/01/2020 16:41:35 Wound Measurements Length: (cm) 0.1 Width: (cm) 0.1 Depth: (cm) 0.1 Area: (cm) 0.008 Volume: (cm) 0.001 % Reduction in Area: 98.4% % Reduction in Volume: 98% Epithelialization: Large (67-100%) Tunneling: No Undermining: No Wound Description Classification: Category/Stage III Wound Margin: Flat and Intact Exudate Amount: None Present Foul Odor After Cleansing: No Slough/Fibrino No Wound Bed Granulation Amount: None Present (0%) Exposed Structure Necrotic Amount: None Present (0%) Fascia Exposed: No Fat  Layer (Subcutaneous Tissue) Exposed: No Tendon Exposed: No Muscle Exposed: No Joint Exposed: No Bone Exposed: No Treatment Notes Wound #2 (Left, Lateral Malleolus) 3. Primary Dressing Applied [employer] 4. Secondary Dressing Foam Border Dressing Electronic Signature(s) Signed: 01/01/2020 5:52:12 PM By: Kela Millin Entered By: Kela Millin on 01/01/2020 15:33:43 -------------------------------------------------------------------------------- Wound Assessment Details Patient Name: Date of Service: MA [facility], [name] 01/01/2020 2:45 PM Medical Record Number: [id number] Patient Account Number: [id number] Date of Birth/Sex: Treating RN: [birth year] (79 y.o. Marvis Repress Primary Care Khiree Bukhari: [name] Other Clinician: Referring Decarla Siemen: Treating Abeer Deskins/Extender: [name] in Treatment: 3 Wound Status Wound Number: 3 Primary Pressure Ulcer Etiology: Wound Location: Right Achilles Secondary Diabetic Wound/Ulcer of the Lower Extremity Wounding Event: Pressure Injury Etiology: Date Acquired: 09/18/2019 Wound Open Weeks Of Treatment: 3 Status: Clustered Wound: No Comorbid Glaucoma, Chronic Obstructive Pulmonary Disease (COPD), Sleep History: Apnea, Congestive Heart Failure, Hypertension, Peripheral Arterial Disease, Type II Diabetes, Gout, Osteoarthritis, Paraplegia, Received Radiation Photos Photo Uploaded By: Mikeal Hawthorne on 01/01/2020 16:41:02 Wound Measurements Length: (cm) 0.8 Width: (cm) 0.4 Depth: (cm) 0.1 Area: (cm) 0.251 Volume: (cm) 0.025 % Reduction in Area: 93.6% % Reduction in Volume: 93.6% Epithelialization: Small (1-33%) Tunneling: No Undermining: No Wound Description Classification: Category/Stage III Wound Margin: Flat and Intact Exudate Amount: Medium Exudate Type: Serosanguineous Exudate Color: red, brown Foul Odor After Cleansing: No Slough/Fibrino No Wound Bed Granulation Amount: Large  (67-100%) Exposed Structure Granulation Quality: Red Fascia Exposed: No Necrotic Amount: None Present (0%) Fat Layer (Subcutaneous Tissue) Exposed: Yes Tendon Exposed: No Muscle Exposed: No Joint Exposed: No Bone Exposed: No Treatment Notes Wound #3 (Right Achilles) 3. Primary Dressing Applied [employer] 4. Secondary Dressing Foam Border Dressing Electronic Signature(s) Signed: 01/01/2020 5:52:12 PM By: Kela Millin Entered By: Kela Millin on 01/01/2020 15:34:14 -------------------------------------------------------------------------------- Vitals Details Patient Name: Date of Service: MA [facility], [name] 01/01/2020 2:45 PM Medical Record Number: [id number] Patient Account Number: [id number] Date of Birth/Sex: Treating RN: [birth year] (79 y.o. Marvis Repress Primary Care Mckenna Boruff: [name] Other Clinician: Referring Ryann Leavitt: Treating Jacie Tristan/Extender: [name] in Treatment: 3 Vital Signs Time Taken: 15:30 Temperature (F): 98.1 Height (in): 67 Pulse (bpm): 80 Weight (lbs): 211 Respiratory Rate (breaths/min): 19 Body Mass Index (BMI): 33 Blood Pressure (mmHg): 96/63 Reference Range: 80 - 120 mg / dl Electronic Signature(s) Signed: 01/01/2020 5:52:12 PM By: Kela Millin Entered By: Kela Millin on 01/01/2020 15:31:53

## 2020-01-07 ENCOUNTER — Other Ambulatory Visit: Payer: Self-pay

## 2020-01-07 ENCOUNTER — Encounter (HOSPITAL_BASED_OUTPATIENT_CLINIC_OR_DEPARTMENT_OTHER): Payer: Medicare Other | Admitting: Physician Assistant

## 2020-01-07 ENCOUNTER — Other Ambulatory Visit (HOSPITAL_COMMUNITY)
Admission: RE | Admit: 2020-01-07 | Discharge: 2020-01-07 | Disposition: A | Discharge status: Home / Self Care | Payer: Medicare Other | Source: Other Acute Inpatient Hospital | Attending: Physician Assistant | Admitting: Physician Assistant

## 2020-01-07 DIAGNOSIS — B999 Unspecified infectious disease: Secondary | ICD-10-CM | POA: Insufficient documentation

## 2020-01-07 DIAGNOSIS — E11621 Type 2 diabetes mellitus with foot ulcer: Secondary | ICD-10-CM | POA: Diagnosis not present

## 2020-01-07 MED ORDER — HYDROCODONE-ACETAMINOPHEN 5-325 MG PO TABS: 1.0000 | ORAL_TABLET | Freq: Three times a day (TID) | ORAL | 0 refills | Status: DC | PRN

## 2020-01-07 NOTE — Progress Notes (Addendum)
FURQAN, GOSSELIN ([id number]) Visit Report for 01/07/2020 Chief Complaint Document Details Patient Name: Date of Service: [name] 01/07/2020 2:45 PM Medical Record Number: [id number] Patient Account Number: [id number] Date of Birth/Sex: Treating RN: 1940-07-26 (79 y.o. [name] Primary Care Provider: [name] Other Clinician: Referring Provider: Treating Provider/Extender: [name] in Treatment: 4 Information Obtained from: Patient Chief Complaint Sacral, right achilles, left ankle, and right dorsal foot ulcers Electronic Signature(s) Signed: 01/07/2020 3:16:35 PM By: Worthy Keeler PA-C Entered By: Worthy Keeler on 01/07/2020 15:16:34 -------------------------------------------------------------------------------- HPI Details Patient Name: Date of Service: [facility], [name] 01/07/2020 2:45 PM Medical Record Number: [id number] Patient Account Number: [id number] Date of Birth/Sex: Treating RN: August 14, 1940 (79 y.o. [name] Primary Care Provider: [name] Other Clinician: Referring Provider: Treating Provider/Extender: [name] in Treatment: 4 History of Present Illness HPI Description: 12/10/2019 upon evaluation today patient appears for initial evaluation here in our clinic unfortunately due to having multiple pressure injuries noted at this point. Including in the sacral region, left ankle, and Achilles region. He also has an ulcer on top of his foot but this appears to be mainly dry I do not even know that there is really a major issue here but nonetheless we are going to continue to monitor this and to the eschar pops off. He still is having some pain he also had apparently some infection around this area that his primary care provider placed him on antibiotic for. The patient does have hypertension and also muscle weakness that came on all of a sudden at the beginning of this year  around March and unfortunately he has not been able to walk since that time prior to that he was walking with some weakness due to spinal stenosis but nothing like what he is experiencing right now. He does spend a lot of his day sitting though he does reposition in his chair I am not sure if that is sufficient or not we will have to see how things progress. Nonetheless the sacral wound is definitely the worst part of everything that he has at this point. He does have diabetes which is very well controlled most recent hemoglobin A1c was 5.4. 12/17/2019 upon evaluation today patient appears to be doing decently well in regard to several of his ulcers on the foot and ankle region. I am very pleased with this. The Achilles area is also doing well. In fact the dorsal surface of the foot is completely healed and this is great news. The sacral region however is another story he tends to sit in his chair most of the day leaning back and I think this is putting pressure on this area even though he is trying to get in a position that is comfortable for him unfortunately I think this is leading to him having more issues with skin breakdown and I think this wound is good to get worse not better if were not careful. I explained that to the patient and his wife today during the evaluation. 7/15; patient I have not seen previously. 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 He also has an area on the left lateral malleolus and the Achilles area. The area on his left lateral malleolus and Achilles are doing well using silver collagen He spent a protracted period of time in the hospital back and forth I think between the  rehab unit largely related to lower back and cervical spine issues. He is no longer ambulatory. During this time he developed a pressure ulcer on his lower sacrum. When he came here this was unstageable it is now more clean wound with clearly exposed bone. It  has a very distinct odor and apparently that is been true on previous visits as well. Last albumin ICU was 3.4. He is religiously turning and offloading this area spending most of his time in bed. He is apparently eating well 01/07/2020 upon evaluation today patient appears to be doing better in regard to his foot and Achilles region. Overall very pleased in that regard. Unfortunately he is not doing quite as well with regard to the sacrum although this is better than when I last saw it and appears to be better as well according to nursing staff as compared to last week's evaluation when I was not here. The wound is deeper but again is cleaned up much more effectively I do believe packing with the gauze as we have been is still the appropriate thing to do. Fortunately there is no signs in general of active infection at this point. Electronic Signature(s) Signed: 01/07/2020 6:26:34 PM By: Worthy Keeler PA-C Entered By: Worthy Keeler on 01/07/2020 18:26:33 -------------------------------------------------------------------------------- Physical Exam Details Patient Name: Date of Service: Cesar [facility], [name] 01/07/2020 2:45 PM Medical Record Number: [id number] Patient Account Number: [id number] Date of Birth/Sex: Treating RN: 05/28/1941 (79 y.o. [name] Primary Care Provider: [name] Other Clinician: Referring Provider: Treating Provider/Extender: [name] in Treatment: 4 Constitutional Well-nourished and well-hydrated in no acute distress. Respiratory normal breathing without difficulty. Psychiatric this patient is able to make decisions and demonstrates good insight into disease process. Alert and Oriented x 3. pleasant and cooperative. Notes Patient's wound bed currently showed signs of good granulation at this point there does not appear to be any signs of active infection overall very pleased with where things stand. There is no evidence  that there is anything untoward going on in regard to his foot or Achilles region and both of these appear to be doing excellent today. In regard to the sacral region there is no need for sharp debridement and I do not feel any obvious bone exposed at this point. Obviously there was last week but I do not see it right now. I am going obtain a wound culture however to try to see if I can identify what bacteria from the deepest part of this wound may be causing the infection which was identified on the review of the patient's lab results which did show that he had an elevated sed rate, C-reactive protein, though his white blood cell count was more stable. Electronic Signature(s) Signed: 01/07/2020 6:27:32 PM By: Worthy Keeler PA-C Entered By: Worthy Keeler on 01/07/2020 18:27:32 -------------------------------------------------------------------------------- Physician Orders Details Patient Name: Date of Service: Cesar [facility], [name] 01/07/2020 2:45 PM Medical Record Number: [id number] Patient Account Number: [id number] Date of Birth/Sex: Treating RN: 15-Mar-1941 (79 y.o. 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 - ****[facility] - ROOM 5**** Return A Dressing Change Frequency Wound #1 Sacrum Change dressing every day. Wound #3 Right Achilles Change Dressing every other day. Wound  Cleansing Wound #1 Sacrum Clean wound with Wound Cleanser Wound #3 Right Achilles May shower and wash wound with soap and water. Primary Wound Dressing Wound #1 Sacrum Other: - Vashe moistened gauze packing. Use Anasept gel in clinic. Wound #3 Right Achilles Silver Collagen - moisten with saline Secondary Dressing Wound #1 Sacrum ABD pad - or foam border Wound #3 Right Achilles Foam Border - or large bandaid Off-Loading Multipodus Splint to: - or bunny boots to both feet especially when in bed Low air-loss mattress (Group 2) Turn and reposition every 2 hours [place] skilled nursing for wound care. - [facility] naerobe culture (MICRO) - sacrum Bacteria identified in Unspecified specimen by A LOINC Code: 161-0 Convenience Name: Anerobic culture Electronic Signature(s) Signed: 01/07/2020 6:54:26 PM By: Worthy Keeler PA-C Signed: 01/08/2020 4:56:58 PM By: Baruch Gouty RN, BSN Entered By: Baruch Gouty on 01/07/2020 15:57:52 -------------------------------------------------------------------------------- Problem List Details Patient Name: Date of Service: Cesar [facility], [name] 01/07/2020 2:45 PM Medical Record Number: [id number] Patient Account Number: [id number] Date of Birth/Sex: Treating RN: 1941-02-28 (79 y.o. [name] Primary Care Provider: [name] Other Clinician: Referring Provider: Treating Provider/Extender: [name] in Treatment: 4 Active Problems ICD-10 Encounter Code Description Active Date MDM Diagnosis 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, unstageable 12/10/2019 No Yes L89.520 Pressure ulcer of left ankle, unstageable 12/10/2019 No Yes L89.893 Pressure ulcer of other site, stage 3 12/10/2019 No Yes E11.621 Type 2 diabetes mellitus with foot ulcer 12/10/2019 No Yes L97.518 Non-pressure chronic ulcer of other part of right foot with other specified 12/10/2019 No  Yes severity I10 Essential (primary) hypertension 12/10/2019 No Yes M62.81 Muscle weakness (generalized) 12/10/2019 No Yes Inactive Problems Resolved Problems Electronic Signature(s) Signed: 01/07/2020 3:16:28 PM By: Worthy Keeler PA-C Entered By: Worthy Keeler on 01/07/2020 15:16:27 -------------------------------------------------------------------------------- Progress Note Details Patient Name: Date of Service: Cesar [facility], [name] 01/07/2020 2:45 PM Medical Record Number: [id number] Patient Account Number: [id number] Date of Birth/Sex: Treating RN: 1940-09-23 (79 y.o. [name] Primary Care Provider: [name] Other Clinician: Referring Provider: Treating Provider/Extender: [name] in Treatment: 4 Subjective Chief Complaint Information obtained from Patient Sacral, right achilles, left ankle, and right dorsal foot ulcers History of Present Illness (HPI) 12/10/2019 upon evaluation today patient appears for initial evaluation here in our clinic unfortunately due to having multiple pressure injuries noted at this point. Including in the sacral region, left ankle, and Achilles region. He also has an ulcer on top of his foot but this appears to be mainly dry I do not even know that there is really a major issue here but nonetheless we are going to continue to monitor this and to the eschar pops off. He still is having some pain he also had apparently some infection around this area that his primary care provider placed him on antibiotic for. The patient does have hypertension and also muscle weakness that came on all of a sudden at the beginning of this year around March and unfortunately he has not been able to walk since that time prior to that he was walking with some weakness due to spinal stenosis but nothing like what he  is experiencing right now. He does spend a lot of his day sitting though he does reposition in his chair I am not sure  if that is sufficient or not we will have to see how things progress. Nonetheless the sacral wound is definitely the worst part of everything that he has at this point. He does have diabetes which is very well controlled most recent hemoglobin A1c was 5.4. 12/17/2019 upon evaluation today patient appears to be doing decently well in regard to several of his ulcers on the foot and ankle region. I am very pleased with this. The Achilles area is also doing well. In fact the dorsal surface of the foot is completely healed and this is great news. The sacral region however is another story he tends to sit in his chair most of the day leaning back and I think this is putting pressure on this area even though he is trying to get in a position that is comfortable for him unfortunately I think this is leading to him having more issues with skin breakdown and I think this wound is good to get worse not better if were not careful. I explained that to the patient and his wife today during the evaluation. 7/15; patient I have not seen previously. 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 He also has an area on the left lateral malleolus and the Achilles area. The area on his left lateral malleolus and Achilles are doing well using silver collagen He spent a protracted period of time in the hospital back and forth I think between the rehab unit largely related to lower back and cervical spine issues. He is no longer ambulatory. During this time he developed a pressure ulcer on his lower sacrum. When he came here this was unstageable it is now more clean wound with clearly exposed bone. It has a very distinct odor and apparently that is been true on previous visits as well. Last albumin ICU was 3.4. He is religiously turning and offloading this area spending most of his time in bed. He is apparently eating well 01/07/2020 upon evaluation today patient appears to be doing  better in regard to his foot and Achilles region. Overall very pleased in that regard. Unfortunately he is not doing quite as well with regard to the sacrum although this is better than when I last saw it and appears to be better as well according to nursing staff as compared to last week's evaluation when I was not here. The wound is deeper but again is cleaned up much more effectively I do believe packing with the gauze as we have been is still the appropriate thing to do. Fortunately there is no signs in general of active infection at this point. Objective Constitutional Well-nourished and well-hydrated in no acute distress. Vitals Time Taken: 2:55 PM, Height: 67 in, Weight: 211 lbs, BMI: 33, Temperature: 99.0 F, Pulse: 80 bpm, Respiratory Rate: 18 breaths/min, Blood Pressure: 104/57 mmHg. Respiratory normal breathing without difficulty. Psychiatric this patient is able to make decisions and demonstrates good insight into disease process. Alert and Oriented x 3. pleasant and cooperative. General Notes: Patient's wound bed currently showed signs of good granulation at this point there does not appear to be any signs of active infection overall very pleased with where things stand. There is no evidence that there is anything untoward going on in regard to his foot or Achilles region and both of these  appear to be doing excellent today. In regard to the sacral region there is no need for sharp debridement and I do not feel any obvious bone exposed at this point. Obviously there was last week but I do not see it right now. I am going obtain a wound culture however to try to see if I can identify what bacteria from the deepest part of this wound may be causing the infection which was identified on the review of the patient's lab results which did show that he had an elevated sed rate, C-reactive protein, though his white blood cell count was more stable. Integumentary (Hair, Skin) Wound #1 status  is Open. Original cause of wound was Pressure Injury. The wound is located on the Sacrum. The wound measures 6.4cm length x 5cm width x 4.4cm depth; 25.133cm^2 area and 110.584cm^3 volume. There is bone, muscle, and Fat Layer (Subcutaneous Tissue) Exposed exposed. There is no tunneling noted, however, there is undermining starting at 12:00 and ending at 4:00 with a maximum distance of 4.8cm. There is a medium amount of serosanguineous drainage noted. Foul odor after cleansing was noted. The wound margin is well defined and not attached to the wound base. There is medium (34-66%) red granulation within the wound bed. There is a medium (34-66%) amount of necrotic tissue within the wound bed including Adherent Slough. Wound #2 status is Healed - Epithelialized. Original cause of wound was Pressure Injury. The wound is located on the Left,Lateral Malleolus. The wound measures 0cm length x 0cm width x 0cm depth; 0cm^2 area and 0cm^3 volume. There is no tunneling or undermining noted. There is a none present amount of drainage noted. The wound margin is flat and intact. There is no granulation within the wound bed. There is no necrotic tissue within the wound bed. Wound #3 status is Open. Original cause of wound was Pressure Injury. The wound is located on the Right Achilles. The wound measures 1cm length x 0.4cm width x 0.1cm depth; 0.314cm^2 area and 0.031cm^3 volume. There is Fat Layer (Subcutaneous Tissue) Exposed exposed. There is no tunneling or undermining noted. There is a medium amount of serosanguineous drainage noted. The wound margin is flat and intact. There is large (67-100%) red, friable granulation within the wound bed. There is no necrotic tissue within the wound bed. 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 - ****[facility] - ROOM 5**** Dressing Change Frequency: Wound #1 Sacrum: Change dressing every day. Wound #3 Right Achilles: Change Dressing every other day. Wound Cleansing: Wound #1 Sacrum: Clean wound with Wound Cleanser Wound #3 Right Achilles: May shower and wash wound with soap and water. Primary Wound Dressing: Wound #1 Sacrum: Other: - Vashe moistened gauze packing. Use Anasept gel in clinic. Wound #3 Right Achilles: Silver Collagen - moisten with saline Secondary Dressing: Wound #1 Sacrum: ABD pad - or foam border Wound #3 Right Achilles: Foam Border - or large bandaid Off-Loading: Multipodus Splint to: - or bunny boots to both feet especially when in bed Low air-loss mattress (Group 2) Turn and reposition every 2 hours Home Health: [facility] skilled nursing for wound care. - [facility] ordered were: Anerobic culture - sacrum 1. I am going  to suggest currently that we go ahead and initiate treatment with a continuation of the wound care measures with regard to the patient's sacral Lilia Pro utilize the Vioxx moistened gauze packing. 2. We will continue with the collagen moistened with saline to the Achilles region that seem to be doing well. 3. He will continue with the bunny boots as well at this point. 4. I am going to look into a air-fluidized mattress for the patient since he seems to be unfortunately even with the group to service getting pressure to his hip region he has a very difficult time offloading and repositioning and subsequently I think this is causing him some issues at this point he is having to lay on his right side more than any other as it is about the only comfortable position. Nonetheless I am concerned about further breakdown here based on what we  are seeing. We will see patient back for reevaluation in 1 week here in the clinic. If anything worsens or changes patient will contact our office for additional recommendations. Electronic Signature(s) Signed: 01/07/2020 6:28:39 PM By: Worthy Keeler PA-C Entered By: Worthy Keeler on 01/07/2020 18:28:38 -------------------------------------------------------------------------------- SuperBill Details Patient Name: Date of Service: Cesar [facility], [name] 01/07/2020 Medical Record Number: [id number] Patient Account Number: [id number] Date of Birth/Sex: Treating RN: [birth year] (79 y.o. 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 with other specified severity I10 Essential (primary) hypertension M62.81 Muscle weakness (generalized) Facility Procedures CPT4 Code: [id number] Description: 99214 - WOUND CARE VISIT-LEV 4 EST PT Modifier: Quantity: 1 Physician Procedures : CPT4 Code Description Modifier [id number] 99214 - WC PHYS LEVEL 4 - EST PT ICD-10 Diagnosis Description 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, unstageable L89.520 Pressure ulcer of left ankle, unstageable L89.893 Pressure ulcer of other site, stage 3  E11.621 Type 2 diabetes mellitus with foot ulcer Quantity: 1 Electronic Signature(s) Signed: 01/07/2020 6:29:01 PM By: Worthy Keeler PA-C Entered By: Worthy Keeler on 01/07/2020 18:29:00

## 2020-01-07 NOTE — Telephone Encounter (Signed)
PMP was Reviewed: Hydrocodone was e-scribed. Mancel Parsons CMA spoke with daughter regarding the above and was instructed to have [name] keep his appointment with Dr Dagoberto Ligas, she verbalizes understanding.

## 2020-01-08 ENCOUNTER — Encounter (HOSPITAL_BASED_OUTPATIENT_CLINIC_OR_DEPARTMENT_OTHER): Payer: Medicare Other | Admitting: Internal Medicine

## 2020-01-09 ENCOUNTER — Telehealth: Payer: Self-pay | Admitting: *Deleted

## 2020-01-09 NOTE — Telephone Encounter (Signed)
[facility] is requesting a dx for hydrocodone.  You did not mention in note.

## 2020-01-09 NOTE — Telephone Encounter (Signed)
[place] called and diagnosis codes given.

## 2020-01-09 NOTE — Progress Notes (Signed)
[name], [name] ([id number]) Visit Report for 01/07/2020 Arrival Information Details Patient Name: Date of Service: MA [facility] Cesar M. 01/07/2020 2:45 PM Medical Record Number: [id number] Patient Account Number: [id number] Date of Birth/Sex: Treating RN: January 31, 1941 (79 y.o. [name] Primary Care Nahun Kronberg: [name] Other Clinician: Referring Rylea Selway: Treating Donnice Nielsen/Extender: [name] in Treatment: 4 Visit Information History Since Last Visit Added or deleted any medications: No Patient Arrived: Wheel Chair Any new allergies or adverse reactions: No Arrival Time: 14:55 Had a fall or experienced change in No Accompanied By: son-in-law activities of daily living that may affect Transfer Assistance: Harrel Lemon Lift risk of falls: Patient Identification Verified: Yes Signs or symptoms of abuse/neglect since last visito No Secondary Verification Process Completed: Yes Hospitalized since last visit: No Patient Has Alerts: Yes Implantable device outside of the clinic excluding No Patient Alerts: R ABI: 0.89 TBI: 0.64 cellular tissue based products placed in the center L ABI: 0.62 (01/2019) since last visit: Has Dressing in Place as Prescribed: Yes Pain Present Now: Yes Electronic Signature(s) Signed: 01/08/2020 5:02:14 PM By: [name] RN, BSN Entered By: [name] on 01/07/2020 15:21:19 -------------------------------------------------------------------------------- Clinic Level of Care Assessment Details Patient Name: Date of Service: MA [name], JO Cesar M. 01/07/2020 2:45 PM Medical Record Number: [id number] Patient Account Number: [id number] Date of Birth/Sex: Treating RN: 01-26-1941 (79 y.o. [name] Primary Care [name]: [name] Other Clinician: Referring [name]: Treating [name]/Extender: [name] in Treatment: 4 Clinic Level of Care Assessment Items TOOL 4 Quantity Score []  -  0 Use when only an EandM is performed on FOLLOW-UP visit ASSESSMENTS - Nursing Assessment / Reassessment X- 1 10 Reassessment of Co-morbidities (includes updates in patient status) X- 1 5 Reassessment of Adherence to Treatment Plan ASSESSMENTS - Wound and Skin A ssessment / Reassessment []  - 0 Simple Wound Assessment / Reassessment - one wound X- 3 5 Complex Wound Assessment / Reassessment - multiple wounds []  - 0 Dermatologic / Skin Assessment (not related to wound area) ASSESSMENTS - Focused Assessment []  - 0 Circumferential Edema Measurements - multi extremities []  - 0 Nutritional Assessment / Counseling / Intervention X- 1 5 Lower Extremity Assessment (monofilament, tuning fork, pulses) []  - 0 Peripheral Arterial Disease Assessment (using hand held doppler) ASSESSMENTS - Ostomy and/or Continence Assessment and Care []  - 0 Incontinence Assessment and Management []  - 0 Ostomy Care Assessment and Management (repouching, etc.) PROCESS - Coordination of Care X - Simple Patient / Family Education for ongoing care 1 15 []  - 0 Complex (extensive) Patient / Family Education for ongoing care X- 1 10 Staff obtains Consents, Records, T Results / Process Orders est X- 1 10 Staff telephones HHA, Nursing Homes / Clarify orders / etc []  - 0 Routine Transfer to another Facility (non-emergent condition) []  - 0 Routine Hospital Admission (non-emergent condition) []  - 0 New Admissions / Biomedical engineer / Ordering NPWT Apligraf, etc. , []  - 0 Emergency Hospital Admission (emergent condition) X- 1 10 Simple Discharge Coordination []  - 0 Complex (extensive) Discharge Coordination PROCESS - Special Needs []  - 0 Pediatric / Minor Patient Management []  - 0 Isolation Patient Management []  - 0 Hearing / Language / Visual special needs []  - 0 Assessment of Community assistance (transportation, D/C planning, etc.) []  - 0 Additional assistance / Altered mentation []  -  0 Support Surface(s) Assessment (bed, cushion, seat, etc.) INTERVENTIONS - Wound Cleansing / Measurement []  - 0 Simple Wound Cleansing - one  wound X- 3 5 Complex Wound Cleansing - multiple wounds X- 1 5 Wound Imaging (photographs - any number of wounds) []  - 0 Wound Tracing (instead of photographs) []  - 0 Simple Wound Measurement - one wound X- 3 5 Complex Wound Measurement - multiple wounds INTERVENTIONS - Wound Dressings X - Small Wound Dressing one or multiple wounds 2 10 []  - 0 Medium Wound Dressing one or multiple wounds []  - 0 Large Wound Dressing one or multiple wounds X- 1 5 Application of Medications - topical []  - 0 Application of Medications - injection INTERVENTIONS - Miscellaneous []  - 0 External ear exam []  - 0 Specimen Collection (cultures, biopsies, blood, body fluids, etc.) []  - 0 Specimen(s) / Culture(s) sent or taken to Lab for analysis X- 1 10 Patient Transfer (multiple staff / Civil Service fast streamer / Similar devices) []  - 0 Simple Staple / Suture removal (25 or less) []  - 0 Complex Staple / Suture removal (26 or more) []  - 0 Hypo / Hyperglycemic Management (close monitor of Blood Glucose) []  - 0 Ankle / Brachial Index (ABI) - do not check if billed separately X- 1 5 Vital Signs Has the patient been seen at the hospital within the last three years: Yes Total Score: 155 Level Of Care: New/Established - Level 4 Electronic Signature(s) Signed: 01/08/2020 4:56:58 PM By: Baruch Gouty RN, BSN Entered By: Baruch Gouty on 01/07/2020 15:48:54 -------------------------------------------------------------------------------- Encounter Discharge Information Details Patient Name: Date of Service: MA [facility] [place] [facility] M. 01/07/2020 2:45 PM Medical Record Number: [id number] Patient Account Number: [id number] Date of Birth/Sex: Treating RN: 09-20-1940 (78 y.o. [name] Primary Care [name]: [name] Other Clinician: Referring Curties Conigliaro: Treating  [name]/Extender: [name] in Treatment: 4 Encounter Discharge Information Items Discharge Condition: Stable Ambulatory Status: Wheelchair Discharge Destination: Home Transportation: Private Auto Accompanied By: son in law Schedule Follow-up Appointment: Yes Clinical Summary of Care: Patient Declined Electronic Signature(s) Signed: 01/09/2020 5:42:58 PM By: Carlene Coria RN Entered By: Carlene Coria on 01/07/2020 16:32:05 -------------------------------------------------------------------------------- Lower Extremity Assessment Details Patient Name: Date of Service: MA [facility] M. 01/07/2020 2:45 PM Medical Record Number: [id number] Patient Account Number: [id number] Date of Birth/Sex: Treating RN: 05-02-1941 (79 y.o. [name] Primary Care Kiyah Demartini: [name] Other Clinician: Referring Yancey Pedley: Treating Kashius Dominic/Extender: [name] in Treatment: 4 Edema Assessment Assessed: Shirlyn Goltz: No] Patrice Paradise: No] Edema: [Left: No] [Right: No] Calf Left: Right: Point of Measurement: 28 cm From Medial Instep 21 cm 33.8 cm Ankle Left: Right: Point of Measurement: 10 cm From Medial Instep 31 cm 21.8 cm Vascular Assessment Pulses: Dorsalis Pedis Palpable: [Left:Yes] [Right:Yes] Electronic Signature(s) Signed: 01/08/2020 5:02:14 PM By: [name] RN, BSN Entered By: [name] on 01/07/2020 15:22:05 -------------------------------------------------------------------------------- [place] Details Patient Name: Date of Service: MA [facility], [name] 01/07/2020 2:45 PM Medical Record Number: [id number] Patient Account Number: [id number] Date of Birth/Sex: Treating RN: Mar 17, 1941 (79 y.o. [name] Primary Care Covey Baller: [name] Other Clinician: Referring Olliver Boyadjian: Treating Dennette Faulconer/Extender: [name] in Treatment: 4 Active Inactive Abuse / Safety  / Falls / Self Care Management Nursing Diagnoses: Potential for falls Goals: Patient/caregiver will verbalize/demonstrate measures taken to prevent injury and/or falls Date Initiated: 12/10/2019 Target Resolution Date: 02/04/2020 Goal Status: Active Interventions: Assess fall risk on admission and as needed Assess: immobility, friction, shearing, incontinence upon admission and as needed Notes: Nutrition Nursing Diagnoses: Potential for alteratiion in Nutrition/Potential for imbalanced nutrition Goals: Patient/caregiver will  maintain therapeutic glucose control Date Initiated: 12/10/2019 Target Resolution Date: 02/04/2020 Goal Status: Active Interventions: Assess HgA1c results as ordered upon admission and as needed Provide education on elevated blood sugars and impact on wound healing Treatment Activities: Patient referred to Primary Care Physician for further nutritional evaluation : 12/10/2019 Notes: Pressure Nursing Diagnoses: Knowledge deficit related to management of pressures ulcers Potential for impaired tissue integrity related to pressure, friction, moisture, and shear Goals: Patient/caregiver will verbalize understanding of pressure ulcer management Date Initiated: 12/10/2019 Target Resolution Date: 02/04/2020 Goal Status: Active Interventions: Assess: immobility, friction, shearing, incontinence upon admission and as needed Assess offloading mechanisms upon admission and as needed Assess potential for pressure ulcer upon admission and as needed Notes: Wound/Skin Impairment Nursing Diagnoses: Impaired tissue integrity Knowledge deficit related to ulceration/compromised skin integrity Goals: Patient/caregiver will verbalize understanding of skin care regimen Date Initiated: 12/10/2019 Target Resolution Date: 02/04/2020 Goal Status: Active Ulcer/skin breakdown will have a volume reduction of 30% by week 4 Date Initiated: 12/10/2019 Target Resolution Date:  01/14/2020 Goal Status: Active Interventions: Assess patient/caregiver ability to obtain necessary supplies Assess patient/caregiver ability to perform ulcer/skin care regimen upon admission and as needed Assess ulceration(s) every visit Provide education on ulcer and skin care Treatment Activities: Skin care regimen initiated : 12/10/2019 Topical wound management initiated : 12/10/2019 Notes: Electronic Signature(s) Signed: 01/08/2020 4:56:58 PM By: Baruch Gouty RN, BSN Entered By: Baruch Gouty on 01/07/2020 15:38:29 -------------------------------------------------------------------------------- Pain Assessment Details Patient Name: Date of Service: MA [facility], [name] 01/07/2020 2:45 PM Medical Record Number: [id number] Patient Account Number: [id number] Date of Birth/Sex: Treating RN: November 27, 1940 (79 y.o. [name] Primary Care Chais Fehringer: [name] Other Clinician: Referring Janaisha Tolsma: Treating Shirline Kendle/Extender: [name] in Treatment: 4 Active Problems Location of Pain Severity and Description of Pain Patient Has Paino Yes Site Locations Pain Location: Pain in Ulcers With Dressing Change: Yes Duration of the Pain. Constant / Intermittento Intermittent Rate the pain. Current Pain Level: 5 Character of Pain Describe the Pain: Throbbing Pain Management and Medication Current Pain Management: Medication: Yes Cold Application: No Rest: No Massage: No Activity: No T.E.N.S.: No Heat Application: No Leg drop or elevation: No Is the Current Pain Management Adequate: Adequate How does your wound impact your activities of daily livingo Sleep: No Bathing: No Appetite: No Relationship With Others: No Bladder Continence: No Emotions: No Bowel Continence: No Work: No Toileting: No Drive: No Dressing: No Hobbies: No Engineer, maintenance) Signed: 01/08/2020 5:02:14 PM By: [name] RN, BSN Entered By: [name]  on 01/07/2020 15:21:57 -------------------------------------------------------------------------------- Patient/Caregiver Education Details Patient Name: Date of Service: MA [facility] M. 7/21/2021andnbsp2:45 PM Medical Record Number: [id number] Patient Account Number: [id number] Date of Birth/Gender: Treating RN: [birth year] (79 y.o. [name] Primary Care Physician: [name] Other Clinician: Referring Physician: Treating Physician/Extender: [name] in Treatment: 4 Education Assessment Education Provided To: Patient Education Topics Provided Pressure: Methods: Explain/Verbal Responses: Reinforcements needed, State content correctly Wound/Skin Impairment: Methods: Explain/Verbal Responses: Reinforcements needed, State content correctly Electronic Signature(s) Signed: 01/08/2020 4:56:58 PM By: Baruch Gouty RN, BSN Entered By: Baruch Gouty on 01/07/2020 15:38:52 -------------------------------------------------------------------------------- Wound Assessment Details Patient Name: Date of Service: MA [facility], [name] 01/07/2020 2:45 PM Medical Record Number: [id number] Patient Account Number: [id number] Date of Birth/Sex: Treating RN: February 14, 1941 (79 y.o. [name] Primary Care Andrei Mccook: Other Clinician: [name] Referring Kwan Shellhammer: Treating Jeannetta Cerutti/Extender: [name] in Treatment: 4 Wound Status Wound Number:  1 Primary Pressure Ulcer Etiology: Wound Location: Sacrum Wound Open Wounding Event: Pressure Injury Status: Date Acquired: 09/18/2019 Comorbid Glaucoma, Chronic Obstructive Pulmonary Disease (COPD), Sleep Weeks Of Treatment: 4 History: Apnea, Congestive Heart Failure, Hypertension, Peripheral Arterial Clustered Wound: No Disease, Type II Diabetes, Gout, Osteoarthritis, Paraplegia, Received Radiation Photos Photo Uploaded By: Mikeal Hawthorne on 01/08/2020  15:36:20 Wound Measurements Length: (cm) 6.4 % Re Width: (cm) 5 % Re Depth: (cm) 4.4 Epit Area: (cm) 25.133 Tun Volume: (cm) 110.584 Und S E M duction in Area: -14.3% duction in Volume: -57.1% helialization: None neling: No ermining: Yes tarting Position (o'clock): 12 nding Position (o'clock): 4 aximum Distance: (cm) 4.8 Wound Description Classification: Category/Stage IV Fou Wound Margin: Well defined, not attached Due Exudate Amount: Medium Slo Exudate Type: Serosanguineous Exudate Color: red, brown l Odor After Cleansing: Yes to Product Use: No ugh/Fibrino Yes Wound Bed Granulation Amount: Medium (34-66%) Exposed Structure Granulation Quality: Red Fascia Exposed: No Necrotic Amount: Medium (34-66%) Fat Layer (Subcutaneous Tissue) Exposed: Yes Necrotic Quality: Adherent Slough Tendon Exposed: No Muscle Exposed: Yes Necrosis of Muscle: No Joint Exposed: No Bone Exposed: Yes Treatment Notes Wound #1 (Sacrum) 1. Cleanse With Wound Cleanser 3. Primary Dressing Applied Other primary dressing (specifiy in notes) 4. Secondary Dressing ABD Pad Dry Gauze 5. Secured With Tape Notes packed with anasept gel moistened gauze Electronic Signature(s) Signed: 01/08/2020 5:02:14 PM By: [name] RN, BSN Entered By: [name] on 01/07/2020 15:23:15 -------------------------------------------------------------------------------- Wound Assessment Details Patient Name: Date of Service: MA [name], [name] 01/07/2020 2:45 PM Medical Record Number: [id number] Patient Account Number: [id number] Date of Birth/Sex: Treating RN: 1941/05/11 (79 y.o. [name] Primary Care Breslyn Abdo: [name] Other Clinician: Referring Somnang Mahan: Treating Rasheedah Reis/Extender: [name] in Treatment: 4 Wound Status Wound Number: 2 Primary Pressure Ulcer Etiology: Wound Location: Left, Lateral Malleolus Secondary Diabetic Wound/Ulcer of the  Lower Extremity Wounding Event: Pressure Injury Etiology: Date Acquired: 11/17/2019 Wound Healed - Epithelialized Weeks Of Treatment: 4 Status: Clustered Wound: No Comorbid Glaucoma, Chronic Obstructive Pulmonary Disease (COPD), History: Sleep Apnea, Congestive Heart Failure, Hypertension, Peripheral Arterial Disease, Type II Diabetes, Gout, Osteoarthritis, Paraplegia, Received Radiation Photos Photo Uploaded By: Mikeal Hawthorne on 01/08/2020 15:35:51 Wound Measurements Length: (cm) Width: (cm) Depth: (cm) Area: (cm) Volume: (cm) 0 % Reduction in Area: 100% 0 % Reduction in Volume: 100% 0 Epithelialization: Large (67-100%) 0 Tunneling: No 0 Undermining: No Wound Description Classification: Category/Stage III Wound Margin: Flat and Intact Exudate Amount: None Present Foul Odor After Cleansing: No Slough/Fibrino No Wound Bed Granulation Amount: None Present (0%) Exposed Structure Necrotic Amount: None Present (0%) Fascia Exposed: No Fat Layer (Subcutaneous Tissue) Exposed: No Tendon Exposed: No Muscle Exposed: No Joint Exposed: No Bone Exposed: No Electronic Signature(s) Signed: 01/08/2020 5:02:14 PM By: [name] RN, BSN Entered By: [name] on 01/07/2020 15:23:33 -------------------------------------------------------------------------------- Wound Assessment Details Patient Name: Date of Service: MA [facility], [name] 01/07/2020 2:45 PM Medical Record Number: [id number] Patient Account Number: [id number] Date of Birth/Sex: Treating RN: 1941/05/12 (79 y.o. [name] Primary Care Kailen Hinkle: [name] Other Clinician: Referring Shamecca Whitebread: Treating Brylea Pita/Extender: [name] in Treatment: 4 Wound Status Wound Number: 3 Primary Pressure Ulcer Etiology: Wound Location: Right Achilles Secondary Diabetic Wound/Ulcer of the Lower Extremity Wounding Event: Pressure Injury Etiology: Date Acquired: 09/18/2019 Wound  Open Weeks Of Treatment: 4 Status: Clustered Wound: No Comorbid Glaucoma, Chronic Obstructive Pulmonary Disease (COPD), History: Sleep Apnea, Congestive Heart Failure, Hypertension, Peripheral Arterial Disease,  Type II Diabetes, Gout, Osteoarthritis, Paraplegia, Received Radiation Photos Photo Uploaded By: Mikeal Hawthorne on 01/08/2020 15:35:52 Wound Measurements Length: (cm) 1 % Redu Width: (cm) 0.4 % Redu Depth: (cm) 0.1 Epithe Area: (cm) 0.314 Tunne Volume: (cm) 0.031 Under ction in Area: 92% ction in Volume: 92.1% lialization: Small (1-33%) ling: No mining: No Wound Description Classification: Category/Stage III Foul Wound Margin: Flat and Intact Sloug Exudate Amount: Medium Exudate Type: Serosanguineous Exudate Color: red, brown Odor After Cleansing: No h/Fibrino No Wound Bed Granulation Amount: Large (67-100%) Exposed Structure Granulation Quality: Red, Friable Fascia Exposed: No Necrotic Amount: None Present (0%) Fat Layer (Subcutaneous Tissue) Exposed: Yes Tendon Exposed: No Muscle Exposed: No Joint Exposed: No Bone Exposed: No Treatment Notes Wound #3 (Right Achilles) 1. Cleanse With Wound Cleanser 3. Primary Dressing Applied Calcium Alginate Ag 4. Secondary Dressing Foam Border Dressing Electronic Signature(s) Signed: 01/08/2020 5:02:14 PM By: [name] RN, BSN Entered By: [name] on 01/07/2020 15:24:20 -------------------------------------------------------------------------------- Vitals Details Patient Name: Date of Service: MA [facility], [name] 01/07/2020 2:45 PM Medical Record Number: [id number] Patient Account Number: [id number] Date of Birth/Sex: Treating RN: 08/20/1940 (79 y.o. [name] Primary Care Maryama Kuriakose: [name] Other Clinician: Referring Kaiea Esselman: Treating Maiko Salais/Extender: [name] in Treatment: 4 Vital Signs Time Taken: 14:55 Temperature (F): 99.0 Height (in):  67 Pulse (bpm): 80 Weight (lbs): 211 Respiratory Rate (breaths/min): 18 Body Mass Index (BMI): 33 Blood Pressure (mmHg): 104/57 Reference Range: 80 - 120 mg / dl Electronic Signature(s) Signed: 01/08/2020 5:02:14 PM By: [name] RN, BSN Entered By: [name] on 01/07/2020 15:21:39

## 2020-01-10 LAB — AEROBIC CULTURE W GRAM STAIN (SUPERFICIAL SPECIMEN)

## 2020-01-12 ENCOUNTER — Other Ambulatory Visit: Payer: Self-pay

## 2020-01-12 ENCOUNTER — Encounter: Payer: Medicare Other | Attending: Registered Nurse | Admitting: Physical Medicine and Rehabilitation

## 2020-01-12 ENCOUNTER — Encounter: Payer: Self-pay | Admitting: Physical Medicine and Rehabilitation

## 2020-01-12 VITALS — BP 144/84 | HR 71 | Temp 99.0°F | Ht 67.0 in | Wt 200.0 lb

## 2020-01-12 DIAGNOSIS — I10 Essential (primary) hypertension: Secondary | ICD-10-CM | POA: Insufficient documentation

## 2020-01-12 DIAGNOSIS — L03115 Cellulitis of right lower limb: Secondary | ICD-10-CM | POA: Insufficient documentation

## 2020-01-12 DIAGNOSIS — G8222 Paraplegia, incomplete: Secondary | ICD-10-CM | POA: Insufficient documentation

## 2020-01-12 DIAGNOSIS — L89159 Pressure ulcer of sacral region, unspecified stage: Secondary | ICD-10-CM | POA: Insufficient documentation

## 2020-01-12 DIAGNOSIS — L98429 Non-pressure chronic ulcer of back with unspecified severity: Secondary | ICD-10-CM | POA: Diagnosis not present

## 2020-01-12 DIAGNOSIS — G959 Disease of spinal cord, unspecified: Secondary | ICD-10-CM | POA: Diagnosis not present

## 2020-01-12 DIAGNOSIS — I5032 Chronic diastolic (congestive) heart failure: Secondary | ICD-10-CM | POA: Diagnosis present

## 2020-01-12 DIAGNOSIS — N319 Neuromuscular dysfunction of bladder, unspecified: Secondary | ICD-10-CM

## 2020-01-12 DIAGNOSIS — G8220 Paraplegia, unspecified: Secondary | ICD-10-CM | POA: Diagnosis not present

## 2020-01-12 DIAGNOSIS — R252 Cramp and spasm: Secondary | ICD-10-CM

## 2020-01-12 DIAGNOSIS — Z9889 Other specified postprocedural states: Secondary | ICD-10-CM | POA: Insufficient documentation

## 2020-01-12 MED ORDER — HYDROCODONE-ACETAMINOPHEN 5-325 MG PO TABS: 1.0000 | ORAL_TABLET | Freq: Four times a day (QID) | ORAL | 0 refills | Status: DC | PRN

## 2020-01-12 MED ORDER — BACLOFEN 20 MG PO TABS
40.0000 mg | ORAL_TABLET | Freq: Four times a day (QID) | ORAL | 5 refills | Status: DC
Start: 2020-01-12 — End: 2020-08-09

## 2020-01-12 NOTE — Progress Notes (Signed)
Subjective:    Patient ID: [name], male    DOB: [birth year], 79 y.o.   MRN: [id number]  HPI  1.Cervical Myelopathy: Incomplete Paraplegia: Continue HEP as Tolerated. [name] still waiting for Home Health Therapy to be set upi.  2.S/P Lumbar laminectomy: Dr. Zada Finders Following. 3 Essential Hypertension: Continue current Medication regimen. PCP Following.  4.Chronic Diastolic Congestive Heart Failure: PCP Following . Continue to Monitor.  5. Cellulitis of Right Lower Extremity. RX: Doxycycline and [facility]  6. Sacral Decubitus Ulcer: F/U with [facility]. Appointment and Transportation scheduled today.   Has a loaner power w/c- didn't get plugged in-   Ulcer- hurts- all the time-  Is medicated right now-  Robaxin- 750 mg QID and Hydrocodone.  Still takes Baclofen- 30 mg TID- 7:30 am, 12pm; and 5:30 and bedtime 40 mg QHS.   Doesn't think needs Baclofen pump-  Anymore- feels like spasms are better controlled.   Transferring- with hoyer lift- using sling- otherwise, stays in bed all the time- otherwise, stays in bed and turns every 1-2 hours- when in bed- sleeps with ulcer "up" all night.   Taking Norco for pain-  Controls the pain OK- but doesn't control it until next dose is due.   Pain and spasms are the biggest issues.  Baclofen runs out too early.    Seeing wound care physician  And wound care nurse comes to the house 1x/week and Wednesdays sees physician.   Urologists appointment- is virtual appointment- Did urodynamics testing    Wife has 2 broken legs right now.  On walker already.   Pain Inventory Average Pain 8 Pain Right Now 9 My pain is constant and throbbing  In the last 24 hours, has pain interfered with the following? General activity 0 Relation with others 0 Enjoyment of life 8 What TIME of day is your pain at its worst? all Sleep (in general) Poor  Pain is worse with: therapy/exercise Pain  improves with: medication Relief from Meds: 9  Mobility how many minutes can you walk? 0 ability to climb steps?  no do you drive?  no use a wheelchair needs help with transfers  Function retired I need assistance with the following:  dressing, bathing, toileting, meal prep, household duties and shopping  Neuro/Psych bladder control problems bowel control problems spasms  Prior Studies x-rays  Physicians involved in your care ED, [facility], Urologist   Family History  Problem Relation Age of Onset   COPD Mother    Hyperlipidemia Mother    Hypertension Mother    Diabetes Father    Kidney disease Father        ESRD   Hypertension Father    Cancer Father    Hyperlipidemia Father    Social History   Socioeconomic History   Marital status: Married    Spouse name: [name]   Number of children: 2   Years of education: COLLEGE   Highest education level: Not on file  Occupational History    Employer: RETIRED  Tobacco Use   Smoking status: Former Smoker    Years: 40.00    Types: Cigars, Cigarettes    Quit date: 06/19/2005    Years since quitting: 14.5   Smokeless tobacco: Never Used   Tobacco comment: smoked 4-6 cigars daily, only smoked cigarettes socially  Vaping Use   Vaping Use: Never used  Substance and Sexual Activity   Alcohol use: No   Drug use:  No   Sexual activity: Not on file  Other Topics Concern   Not on file  Social History Narrative   Patient is married [name]) and lives at home with his wife.   Patient has two children.   Patient is retired.   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   Patient is right-handed.   Patient drinks three cups of coffee per day.   Social Determinants of Health   Financial Resource Strain:    Difficulty of Paying Living Expenses:   Food Insecurity:    Worried About Charity fundraiser in the Last Year:    Arboriculturist in the Last Year:   Transportation  Needs:    Film/video editor (Medical):    Lack of Transportation (Non-Medical):   Physical Activity:    Days of Exercise per Week:    Minutes of Exercise per Session:   Stress:    Feeling of Stress :   Social Connections:    Frequency of Communication with Friends and Family:    Frequency of Social Gatherings with Friends and Family:    Attends Religious Services:    Active Member of Clubs or Organizations:    Attends Archivist Meetings:    Marital Status:    Past Surgical History:  Procedure Laterality Date   ANTERIOR CERVICAL DECOMP/DISCECTOMY FUSION N/A 09/03/2017   Procedure: ACDF - C3-C4 - C4-C5 - C5-C6;  Surgeon: Kary Kos, MD;  Location: [facility];  Service: Neurosurgery;  Laterality: N/A;   CAROTID ENDARTERECTOMY Left January 03, 2006   Dr. Amedeo Plenty   COLONOSCOPY     EYE SURGERY Bilateral    cataract surgery with lens implants   game keepers thumb Right    JOINT REPLACEMENT Left    knee X 2   LUMBAR LAMINECTOMY/ DECOMPRESSION WITH MET-RX N/A 11/04/2019   Procedure: LUMBAR THREE-FOUR LUMBAR LAMINECTOMY/ DECOMPRESSION WITH MET-RX;  Surgeon: Judith Part, MD;  Location: [facility];  Service: Neurosurgery;  Laterality: N/A;   RADIOLOGY WITH ANESTHESIA N/A 10/16/2019   Procedure: MRI THORACIC LUMBAR SPINE;  Surgeon: Radiologist, Medication, MD;  Location: [facility];  Service: Radiology;  Laterality: N/A;   ROTATOR CUFF REPAIR Right    THYROIDECTOMY  2008   Past Medical History:  Diagnosis Date   Arthritis    Barrett esophagus    Bronchitis    Carotid artery occlusion    COPD (chronic obstructive pulmonary disease) (HCC)    Diabetes mellitus without complication (HCC)    GERD (gastroesophageal reflux disease)    Headache    migraines   History of thyroid cancer 2008   Hypertension    Restless leg    Sleep apnea with use of continuous positive airway pressure (CPAP)    2011 [facility] , [facility]  77cn central and obstrcutive. 16 cm  water , 3 cm EPR.    Thoracic ascending aortic aneurysm (HCC)    4.2 cm ascending TAA 08/18/17 CTA. Annual imaging recommended.   thyroid ca dx'd 2009 or 2010   surg and radioactive isoptope   Ulcer    Peptic ulcer disease   BP (!) 144/84    Pulse 71    Temp 99 F (37.2 C)    Ht _0  (1.702 m)    Wt 200 lb (90.7 kg)    SpO2 92%    BMI 31.32 kg/m   Opioid Risk Score:   Fall Risk Score:  `1  Depression screen  PHQ 2/9  Depression screen PHQ 2/9 12/08/2019  Decreased Interest 0  Down, Depressed, Hopeless 0  PHQ - 2 Score 0  Altered sleeping 2  Tired, decreased energy 3  Change in appetite 0  Feeling bad or failure about yourself  2  Trouble concentrating 0  Moving slowly or fidgety/restless 0  Suicidal thoughts 0  PHQ-9 Score 7  Some recent data might be hidden    Review of Systems  Musculoskeletal:       Spasms  All other systems reviewed and are negative.      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   1. Increase Norco 5/325 mg to q6 hours prn for pain- because current dose wears off too early.   2. Increase baclofen to 40 mg 4x/day- and changed to 20 mg tablets- FYI.   3. Urology- is dealing with foley  4 Seeing wound care physician  For sacral wound care and L lateral ankle is healed  5. F/U  In 8-10 weeks.  Wear ankle boots at night/1/2 days when in bed.  Can do 2 hrs on;2 hrs off.    I spent a total of 45 minutes on appointment-   Patient needs a power w/c- with elevating leg rests- he needs a  power w/c because he has shorter arms and UE weakness from cervical myelopathy, so is unable to propel a manual w/c- he also cannot do his pressure relief, so needs power chair to do pressure relief via tilt in space.   He also needs elevating leg rests to keeps his spasms under control since he has SEVERE SPASTICITY and help with LE edema.   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   If anything else is needed, please let me know.

## 2020-01-12 NOTE — Patient Instructions (Signed)
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   1. Increase Norco 5/325 mg to q6 hours prn for pain- because current dose wears off too early.   2. Increase baclofen to 40 mg 4x/day- and changed to 20 mg tablets- FYI.   3. Urology- is dealing with foley  4 Seeing wound care physician  For sacral wound care and L lateral ankle is healed  5. F/U  In 8-10 weeks.  Wear ankle boots at night/1/2 days when in bed.  Can do 2 hrs on;2 hrs off.

## 2020-01-14 ENCOUNTER — Encounter (HOSPITAL_BASED_OUTPATIENT_CLINIC_OR_DEPARTMENT_OTHER): Payer: Medicare Other | Admitting: Physician Assistant

## 2020-01-21 ENCOUNTER — Encounter (HOSPITAL_BASED_OUTPATIENT_CLINIC_OR_DEPARTMENT_OTHER): Payer: Medicare Other | Attending: Physician Assistant | Admitting: Physician Assistant

## 2020-01-21 ENCOUNTER — Other Ambulatory Visit: Payer: Self-pay

## 2020-01-21 DIAGNOSIS — E11621 Type 2 diabetes mellitus with foot ulcer: Secondary | ICD-10-CM | POA: Insufficient documentation

## 2020-01-21 DIAGNOSIS — I10 Essential (primary) hypertension: Secondary | ICD-10-CM | POA: Diagnosis not present

## 2020-01-21 DIAGNOSIS — L97518 Non-pressure chronic ulcer of other part of right foot with other specified severity: Secondary | ICD-10-CM | POA: Insufficient documentation

## 2020-01-21 DIAGNOSIS — L89150 Pressure ulcer of sacral region, unstageable: Secondary | ICD-10-CM | POA: Diagnosis not present

## 2020-01-21 DIAGNOSIS — B354 Tinea corporis: Secondary | ICD-10-CM | POA: Insufficient documentation

## 2020-01-21 DIAGNOSIS — M6281 Muscle weakness (generalized): Secondary | ICD-10-CM | POA: Insufficient documentation

## 2020-01-21 NOTE — Progress Notes (Addendum)
[name], [name] ([id number]) Visit Report for 01/21/2020 Chief Complaint Document Details Patient Name: Date of Service: Cesar Cesar [name] 01/21/2020 2:15 PM Medical Record Number: [id number] Patient Account Number: [id number] Date of Birth/Sex: Treating RN: [birth year] (79 y.o. [name] Primary Care Provider: [name] Other Clinician: Referring Provider: Treating Provider/Extender: [name] in Treatment: 6 Information Obtained from: Patient Chief Complaint Sacral, right achilles, left ankle, and right dorsal foot ulcers Electronic Signature(s) Signed: 01/21/2020 2:44:19 PM By: Worthy Keeler PA-C Entered By: Worthy Keeler on 01/21/2020 14:44:19 -------------------------------------------------------------------------------- HPI Details Patient Name: Date of Service: Cesar [facility], [name] 01/21/2020 2:15 PM Medical Record Number: [id number] Patient Account Number: [id number] Date of Birth/Sex: Treating RN: 1941/01/28 (79 y.o. [name] Primary Care Provider: [name] Other Clinician: Referring Provider: Treating Provider/Extender: [name] in Treatment: 6 History of Present Illness HPI Description: 12/10/2019 upon evaluation today patient appears for initial evaluation here in our clinic unfortunately due to having multiple pressure injuries noted at this point. Including in the sacral region, left ankle, and Achilles region. He also has an ulcer on top of his foot but this appears to be mainly dry I do not even know that there is really a major issue here but nonetheless we are going to continue to monitor this and to the eschar pops off. He still is having some pain he also had apparently some infection around this area that his primary care provider placed him on antibiotic for. The patient does have hypertension and also muscle weakness that came on all of a sudden at the beginning of this year around  March and unfortunately he has not been able to walk since that time prior to that he was walking with some weakness due to spinal stenosis but nothing like what he is experiencing right now. He does spend a lot of his day sitting though he does reposition in his chair I am not sure if that is sufficient or not we will have to see how things progress. Nonetheless the sacral wound is definitely the worst part of everything that he has at this point. He does have diabetes which is very well controlled most recent hemoglobin A1c was 5.4. 12/17/2019 upon evaluation today patient appears to be doing decently well in regard to several of his ulcers on the foot and ankle region. I am very pleased with this. The Achilles area is also doing well. In fact the dorsal surface of the foot is completely healed and this is great news. The sacral region however is another story he tends to sit in his chair most of the day leaning back and I think this is putting pressure on this area even though he is trying to get in a position that is comfortable for him unfortunately I think this is leading to him having more issues with skin breakdown and I think this wound is good to get worse not better if were not careful. I explained that to the patient and his wife today during the evaluation. 7/15; patient I have not seen previously. 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 He also has an area on the left lateral malleolus and the Achilles area. The area on his left lateral malleolus and Achilles are doing well using silver collagen He spent a protracted period of time in the hospital back and forth I think between the  rehab unit largely related to lower back and cervical spine issues. He is no longer ambulatory. During this time he developed a pressure ulcer on his lower sacrum. When he came here this was unstageable it is now more clean wound with clearly exposed bone. It has a  very distinct odor and apparently that is been true on previous visits as well. Last albumin ICU was 3.4. He is religiously turning and offloading this area spending most of his time in bed. He is apparently eating well 01/07/2020 upon evaluation today patient appears to be doing better in regard to his foot and Achilles region. Overall very pleased in that regard. Unfortunately he is not doing quite as well with regard to the sacrum although this is better than when I last saw it and appears to be better as well according to nursing staff as compared to last week's evaluation when I was not here. The wound is deeper but again is cleaned up much more effectively I do believe packing with the gauze as we have been is still the appropriate thing to do. Fortunately there is no signs in general of active infection at this point. 01/21/2020 on evaluation today patient actually appears to be doing quite well in regard to his lower extremity ulcers in fact everything appears to be healed today which is great news there is no signs of active infection at this time. No fevers, chills, nausea, vomiting, or diarrhea. With regard to the sacral wound he is also doing quite well in this regard I feel like he is making excellent progress which is great news. Electronic Signature(s) Signed: 01/21/2020 4:50:24 PM By: Worthy Keeler PA-C Entered By: Worthy Keeler on 01/21/2020 16:50:24 -------------------------------------------------------------------------------- Chemical Cauterization Details Patient Name: Date of Service: [facility], [name] 01/21/2020 2:15 PM Medical Record Number: [id number] Patient Account Number: [id number] Date of Birth/Sex: Treating RN: September 15, 1940 (79 y.o. [name] Primary Care Provider: [name] Other Clinician: Referring Provider: Treating Provider/Extender: [name] in Treatment: 6 Procedure Performed for: Wound #1 Sacrum Performed By:  Physician Worthy Keeler, PA Post Procedure Diagnosis Same as Pre-procedure Notes using silver nitrate stick Electronic Signature(s) Signed: 01/21/2020 4:54:27 PM By: Baruch Gouty RN, BSN Signed: 01/21/2020 5:10:43 PM By: Worthy Keeler PA-C Entered By: Baruch Gouty on 01/21/2020 15:41:06 -------------------------------------------------------------------------------- Physical Exam Details Patient Name: Date of Service: Cesar [facility], Ashok [place]on M. 01/21/2020 2:15 PM Medical Record Number: [id number] Patient Account Number: [id number] Date of Birth/Sex: Treating RN: [birth year] (79 y.o. [name] Primary Care Provider: [name] Other Clinician: Referring Provider: Treating Provider/Extender: [name] in Treatment: 6 Constitutional Well-nourished and well-hydrated in no acute distress. Respiratory normal breathing without difficulty. Psychiatric this patient is able to make decisions and demonstrates good insight into disease process. Alert and Oriented x 3. pleasant and cooperative. Notes Upon inspection patient's sacral wound again showed signs of improvement with some hyper granulation around the edge I did do chemical cauterization with silver nitrate to some of the peripheral of the wound edges where the hyper granulation was preventing new epithelial growth he tolerated that without any significant pain. Otherwise no sharp debridement was necessary today and the wounds on the lower extremities appear to be completely healed. Electronic Signature(s) Signed: 01/21/2020 4:50:52 PM By: Worthy Keeler PA-C Entered By: Worthy Keeler on 01/21/2020 16:50:52 -------------------------------------------------------------------------------- Physician Orders Details Patient Name: Date of Service: Cesar [facility], [name] 01/21/2020 2:15 PM  Medical Record Number: [id number] Patient Account Number: [id number] Date of Birth/Sex: Treating RN: 1940-11-10 (79 y.o.  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 - ****[facility] - ROOM 5**** Return A Dressing Change Frequency Wound #1 Sacrum Change dressing every day. Wound Cleansing Wound #1 Sacrum Clean wound with Wound Cleanser Primary Wound Dressing Wound #1 Sacrum Other: - Vashe moistened gauze packing. Secondary Dressing Wound #1 Sacrum ABD pad - or foam border Off-Loading Multipodus Splint to: - or bunny boots to both feet especially when in bed Low air-loss mattress (Group 2) Turn and reposition every 2 hours [facility] care. - [facility] Electronic Signature(s) Signed: 01/21/2020 4:54:27 PM By: Baruch Gouty RN, BSN Signed: 01/21/2020 5:10:43 PM By: Worthy Keeler PA-C Entered By: Baruch Gouty on 01/21/2020 15:42:59 -------------------------------------------------------------------------------- Problem List Details Patient Name: Date of Service: Cesar [facility], [name] 01/21/2020 2:15 PM Medical Record Number: [id number] Patient Account Number: [id number] Date of Birth/Sex: Treating RN: 01/06/1941 (79 y.o. [name] Primary Care Provider: [name] Other Clinician: Referring Provider: Treating Provider/Extender: [name] in Treatment: 6 Active Problems ICD-10 Encounter Code  Description Active Date MDM Diagnosis L89.154 Pressure ulcer of sacral region, stage 4 12/10/2019 No Yes E11.621 Type 2 diabetes mellitus with foot ulcer 12/10/2019 No Yes I10 Essential (primary) hypertension 12/10/2019 No Yes M62.81 Muscle weakness (generalized) 12/10/2019 No Yes Inactive Problems Resolved Problems ICD-10 Code Description Active Date Resolved Date L89.893 Pressure ulcer of other site, stage 3 12/10/2019 12/10/2019 L89.520 Pressure ulcer of left ankle, unstageable 12/10/2019 12/10/2019 L97.518 Non-pressure chronic ulcer of other part of right foot with other specified severity 12/10/2019 12/10/2019 Electronic Signature(s) Signed: 01/21/2020 3:47:44 PM By: Worthy Keeler PA-C Previous Signature: 01/21/2020 2:44:13 PM Version By: Worthy Keeler PA-C Entered By: Worthy Keeler on 01/21/2020 15:47:44 -------------------------------------------------------------------------------- Progress Note Details Patient Name: Date of Service: Cesar [facility], [name] 01/21/2020 2:15 PM Medical Record Number: [id number] Patient Account Number: [id number] Date of Birth/Sex: Treating RN: Aug 31, 1940 (79 y.o. [name] Primary Care Provider: [name] Other Clinician: Referring Provider: Treating Provider/Extender: [name] in Treatment: 6 Subjective Chief Complaint Information obtained from Patient Sacral, right achilles, left ankle, and right dorsal foot ulcers History of Present Illness (HPI) 12/10/2019 upon evaluation today patient appears for initial evaluation here in our clinic unfortunately due to having multiple pressure injuries noted at this point. Including in the sacral region, left ankle, and Achilles region. He also has an ulcer on top of his foot but this appears to be mainly dry I do not even know that there is really a major issue here but nonetheless we are going to continue to monitor this and to the eschar pops off. He still is having  some pain he also had apparently some infection around this area that his primary care provider placed him on antibiotic for. The patient does have hypertension and also muscle weakness that came on  all of a sudden at the beginning of this year around March and unfortunately he has not been able to walk since that time prior to that he was walking with some weakness due to spinal stenosis but nothing like what he is experiencing right now. He does spend a lot of his day sitting though he does reposition in his chair I am not sure if that is sufficient or not we will have to see how things progress. Nonetheless the sacral wound is definitely the worst part of everything that he has at this point. He does have diabetes which is very well controlled most recent hemoglobin A1c was 5.4. 12/17/2019 upon evaluation today patient appears to be doing decently well in regard to several of his ulcers on the foot and ankle region. I am very pleased with this. The Achilles area is also doing well. In fact the dorsal surface of the foot is completely healed and this is great news. The sacral region however is another story he tends to sit in his chair most of the day leaning back and I think this is putting pressure on this area even though he is trying to get in a position that is comfortable for him unfortunately I think this is leading to him having more issues with skin breakdown and I think this wound is good to get worse not better if were not careful. I explained that to the patient and his wife today during the evaluation. 7/15; patient I have not seen previously. 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 He also has an area on the left lateral malleolus and the Achilles area. The area on his left lateral malleolus and Achilles are doing well using silver collagen He spent a protracted period of time in the hospital back and forth I think between the rehab unit  largely related to lower back and cervical spine issues. He is no longer ambulatory. During this time he developed a pressure ulcer on his lower sacrum. When he came here this was unstageable it is now more clean wound with clearly exposed bone. It has a very distinct odor and apparently that is been true on previous visits as well. Last albumin ICU was 3.4. He is religiously turning and offloading this area spending most of his time in bed. He is apparently eating well 01/07/2020 upon evaluation today patient appears to be doing better in regard to his foot and Achilles region. Overall very pleased in that regard. Unfortunately he is not doing quite as well with regard to the sacrum although this is better than when I last saw it and appears to be better as well according to nursing staff as compared to last week's evaluation when I was not here. The wound is deeper but again is cleaned up much more effectively I do believe packing with the gauze as we have been is still the appropriate thing to do. Fortunately there is no signs in general of active infection at this point. 01/21/2020 on evaluation today patient actually appears to be doing quite well in regard to his lower extremity ulcers in fact everything appears to be healed today which is great news there is no signs of active infection at this time. No fevers, chills, nausea, vomiting, or diarrhea. With regard to the sacral wound he is also doing quite well in this regard I feel like he is making excellent progress which is great news. Objective Constitutional Well-nourished  and well-hydrated in no acute distress. Vitals Time Taken: 2:50 PM, Height: 67 in, Weight: 211 lbs, BMI: 33, Temperature: 98.4 F, Pulse: 68 bpm, Respiratory Rate: 18 breaths/min, Blood Pressure: 107/68 mmHg. Respiratory normal breathing without difficulty. Psychiatric this patient is able to make decisions and demonstrates good insight into disease process. Alert and  Oriented x 3. pleasant and cooperative. General Notes: Upon inspection patient's sacral wound again showed signs of improvement with some hyper granulation around the edge I did do chemical cauterization with silver nitrate to some of the peripheral of the wound edges where the hyper granulation was preventing new epithelial growth he tolerated that without any significant pain. Otherwise no sharp debridement was necessary today and the wounds on the lower extremities appear to be completely healed. Integumentary (Hair, Skin) Wound #1 status is Open. Original cause of wound was Pressure Injury. The wound is located on the Sacrum. The wound measures 4cm length x 4cm width x 2.7cm depth; 12.566cm^2 area and 33.929cm^3 volume. There is muscle and Fat Layer (Subcutaneous Tissue) Exposed exposed. There is no tunneling noted, however, there is undermining starting at 12:00 and ending at 4:00 with a maximum distance of 4.5cm. There is a medium amount of serosanguineous drainage noted. The wound margin is well defined and not attached to the wound base. There is large (67-100%) red, hyper - granulation within the wound bed. There is a small (1-33%) amount of necrotic tissue within the wound bed including Adherent Slough. Wound #3 status is Open. Original cause of wound was Pressure Injury. The wound is located on the Right Achilles. The wound measures 0cm length x 0cm width x 0cm depth; 0cm^2 area and 0cm^3 volume. There is no tunneling or undermining noted. There is a none present amount of drainage noted. The wound margin is flat and intact. There is no granulation within the wound bed. There is no necrotic tissue within the wound bed. 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 An Chemical Cauterization procedure was performed by Worthy Keeler, PA. Post procedure Diagnosis Wound #1: Same as Pre-Procedure Notes: using silver nitrate stick Plan Follow-up Appointments: Return Appointment in 2 weeks. - ****[facility] - ROOM 5**** Dressing Change Frequency: Wound #1 Sacrum: Change dressing every day. Wound Cleansing: Wound #1 Sacrum: Clean wound with Wound Cleanser Primary Wound Dressing: Wound #1 Sacrum: Other: - Vashe moistened gauze packing. Secondary Dressing: Wound #1 Sacrum: ABD pad - or foam border Off-Loading: Multipodus Splint to: - or bunny boots to both feet especially when in bed Low air-loss mattress (Group 2) Turn and reposition every 2 hours Home Health: [facility] skilled nursing for wound care. - [facility] 1. I am get a recommend currently that we actually go ahead and continue with the wet-to-dry packing for the sacral wound I think that still appropriate. 2. I am also can recommend that we continue with protective dressings over the Achilles and heel locations. 3. I would also recommend the patient continue with appropriate offloading as well as continuation of the low-air-loss mattress. We will see patient back for reevaluation in 2 weeks here in the clinic. If anything worsens or changes patient will contact our office for additional recommendations. Electronic Signature(s) Signed: 01/21/2020 4:52:19 PM By: Worthy Keeler PA-C Entered By: Worthy Keeler on 01/21/2020 16:52:19 --------------------------------------------------------------------------------  SuperBill Details Patient Name: Date of Service: Cesar Cesar [facility] [name] 01/21/2020 Medical Record Number: [id number] Patient Account Number: [id number] Date of Birth/Sex: Treating RN: Oct 28, 1940 (79 y.o. 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

## 2020-01-22 DIAGNOSIS — Z7189 Other specified counseling: Secondary | ICD-10-CM | POA: Insufficient documentation

## 2020-01-22 NOTE — Progress Notes (Signed)
Cardiology Office Note   Date:  01/23/2020   ID:  [name], DOB 1940/10/07, MRN [id number]  PCP:  [name], MD  Cardiologist:   Minus Breeding, MD   Chief Complaint  Patient presents with  . Shortness of Breath      History of Present Illness:  [name] is a 79 y.o. male who presents for follow up of acute respiratory failure.  He had last year acute diastolic HF with pneumonia.  He had coronary calcium noted on CT.    He called recently with hypotension.  He presents today in wheelchair.  He has had lots of problems with his myelopathy and progressive weakness and pressure ulcers.  He has not had any recent acute cardiovascular complaints however.  He does get his blood pressure checked routinely but he is not bothered by presyncope or syncope or his low blood pressures.  He really has not had a lot of problems with volume.  He denies any chest pressure, neck or arm discomfort.  He has had no new shortness of breath, PND or orthopnea.  He sleeps in a hospital bed.  He gets around in a motorized wheelchair and has to be moved with a [employer] lift.  He has not "doing up to get short of breath."  He does take baseline Lasix but has not had to have any increased doses as he has had no increased weight or edema recently.  Past Medical History:  Diagnosis Date  . Arthritis   . Barrett esophagus   . Bronchitis   . Carotid artery occlusion   . COPD (chronic obstructive pulmonary disease) ([place])   . Diabetes mellitus without complication ([place])   . GERD (gastroesophageal reflux disease)   . Headache    migraines  . History of thyroid cancer 2008  . Hypertension   . Restless leg   . Sleep apnea with use of continuous positive airway pressure (CPAP)    2011 [facility] , [facility]  77cn central and obstrcutive. 16 cm water , 3 cm EPR.   . Thoracic ascending aortic aneurysm (HCC)    4.2 cm ascending TAA 08/18/17 CTA. Annual imaging recommended.  . thyroid ca dx'd 2009 or 2010    surg and radioactive isoptope  . Ulcer    Peptic ulcer disease    Past Surgical History:  Procedure Laterality Date  . ANTERIOR CERVICAL DECOMP/DISCECTOMY FUSION N/A 09/03/2017   Procedure: ACDF - C3-C4 - C4-C5 - C5-C6;  Surgeon: Kary Kos, MD;  Location: [facility];  Service: Neurosurgery;  Laterality: N/A;  . CAROTID ENDARTERECTOMY Left January 03, 2006   Dr. Amedeo Plenty  . COLONOSCOPY    . EYE SURGERY Bilateral    cataract surgery with lens implants  . game keepers thumb Right   . JOINT REPLACEMENT Left    knee X 2  . LUMBAR LAMINECTOMY/ DECOMPRESSION WITH MET-RX N/A 11/04/2019   Procedure: LUMBAR THREE-FOUR LUMBAR LAMINECTOMY/ DECOMPRESSION WITH MET-RX;  Surgeon: Judith Part, MD;  Location: [facility];  Service: Neurosurgery;  Laterality: N/A;  . RADIOLOGY WITH ANESTHESIA N/A 10/16/2019   Procedure: MRI THORACIC LUMBAR SPINE;  Surgeon: Radiologist, Medication, MD;  Location: [facility];  Service: Radiology;  Laterality: N/A;  . ROTATOR CUFF REPAIR Right   . THYROIDECTOMY  2008     Current Outpatient Medications  Medication Sig Dispense Refill  . acetaminophen (TYLENOL) 325 MG tablet Take 2 tablets (650 mg total) by mouth every 6 (six) hours as needed (  pain or temperature).    [name] albuterol (PROVENTIL) (2.5 MG/3ML) 0.083% nebulizer solution Inhale 3 mLs (2.5 mg total) into the lungs every 4 (four) hours as needed for wheezing or shortness of breath. 75 mL 12  . albuterol (VENTOLIN HFA) 108 (90 Base) MCG/ACT inhaler Inhale 1 puff into the lungs every 4 (four) hours as needed for wheezing or shortness of breath. 6.7 g 1  . aspirin EC 81 MG EC tablet Take 1 tablet (81 mg total) by mouth daily.    [name] atorvastatin (LIPITOR) 20 MG tablet Take 1 tablet (20 mg total) by mouth daily at 6 PM. 30 tablet 0  . baclofen (LIORESAL) 20 MG tablet Take 2 tablets (40 mg total) by mouth 4 (four) times daily. For spasticity 240 each 5  . calcium citrate (CALCITRATE - DOSED IN MG ELEMENTAL CALCIUM) 950 (200 Ca) MG tablet  Take 1 tablet (200 mg of elemental calcium total) by mouth daily. 30 tablet 0  . carbidopa-levodopa (SINEMET IR) 10-100 MG tablet Take 1 tablet by mouth 2 (two) times daily. 60 tablet 0  . cholecalciferol (VITAMIN D) 25 MCG (1000 UNIT) tablet Take 1 tablet (1,000 Units total) by mouth daily. 30 tablet 0  . ciprofloxacin (CIPRO) 500 MG tablet Take 500 mg by mouth 2 (two) times daily.    . colchicine 0.6 MG tablet Take 1 tablet (0.6 mg total) by mouth daily. 30 tablet 0  . docusate sodium (COLACE) 100 MG capsule Take 1 capsule (100 mg total) by mouth 2 (two) times daily. 10 capsule 0  . furosemide (LASIX) 40 MG tablet Take 1 tablet (40 mg total) by mouth daily. 30 tablet 1  . gabapentin (NEURONTIN) 300 MG capsule Take 300 mg by mouth 3 (three) times daily.    [name] guaiFENesin (MUCINEX) 600 MG 12 hr tablet Take 2 tablets (1,200 mg total) by mouth 2 (two) times daily. 120 tablet 1  . HYDROcodone-acetaminophen (NORCO/VICODIN) 5-325 MG tablet Take 1 tablet by mouth every 6 (six) hours as needed for moderate pain or severe pain. (Patient taking differently: Take 1 tablet by mouth in the morning, at noon, and at bedtime. ) 120 tablet 0  . latanoprost (XALATAN) 0.005 % ophthalmic solution Place 1 drop into both eyes at bedtime. 2.5 mL 12  . levothyroxine (SYNTHROID) 125 MCG tablet Take 1 tablet (125 mcg total) by mouth daily before breakfast. 30 tablet 1  . methocarbamol (ROBAXIN) 750 MG tablet Take 1 tablet (750 mg total) by mouth every 6 (six) hours as needed for muscle spasms. 90 tablet 0  . metoprolol succinate (TOPROL-XL) 25 MG 24 hr tablet Take 1 tablet (25 mg total) by mouth daily. 30 tablet 1  . Multiple Vitamin (MULTIVITAMIN) tablet Take 1 tablet by mouth daily.    . nitroGLYCERIN (NITROSTAT) 0.4 MG SL tablet Place 0.4 mg every 5 (five) minutes as needed under the tongue for chest pain.    . pantoprazole (PROTONIX) 40 MG tablet Take 1 tablet (40 mg total) by mouth every evening. 30 tablet 0  .  polyethylene glycol powder (MIRALAX) 17 GM/SCOOP powder Take by mouth as directed. 17 gm every other day     . rizatriptan (MAXALT) 10 MG tablet Take 10 mg by mouth daily as needed.    . senna-docusate (SENOKOT-S) 8.6-50 MG tablet Take 2 tablets by mouth 2 (two) times daily.    [name] spironolactone (ALDACTONE) 25 MG tablet Take 1 tablet (25 mg total) by mouth daily. 30 tablet 1  . tamsulosin (  FLOMAX) 0.4 MG CAPS capsule Take 2 capsules (0.8 mg total) by mouth daily after supper. 30 capsule 1  . TRELEGY ELLIPTA 100-62.5-25 MCG/INH AEPB Inhale 1 puff into the lungs daily.   4   No current facility-administered medications for this visit.    Allergies:   Patient has no known allergies.   ROS:  Please see the history of present illness.   Otherwise, review of systems are positive for none.   All other systems are reviewed and negative.    PHYSICAL EXAM: VS:  BP 100/80 (BP Location: Left Arm, Patient Position: Sitting, Cuff Size: Normal)   Pulse 71   Ht _0  (1.702 m)   Wt 210 lb (95.3 kg)   SpO2 92%   BMI 32.89 kg/m  , BMI Body mass index is 32.89 kg/m. GEN:  No distress NECK:  No jugular venous distention at 90 degrees, waveform within normal limits, carotid upstroke brisk and symmetric, no bruits, no thyromegaly LYMPHATICS:  No cervical adenopathy LUNGS:  Clear to auscultation bilaterally BACK:  No CVA tenderness CHEST:  Unremarkable HEART:  S1 and S2 within normal limits, no S3, no S4, no clicks, no rubs, NO murmurs ABD:  Positive bowel sounds normal in frequency in pitch, no bruits, no rebound, no guarding, unable to assess midline mass or bruit with the patient seated. EXT:  2 plus pulses throughout, no edema, no cyanosis no clubbing SKIN:  No rashes no nodules NEURO:  Cranial nerves II through XII grossly intact, motor grossly intact throughout PSYCH:  Cognitively intact, oriented to person place and time   EKG:  EKG is not ordered today. 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   Recent Labs: 02/14/2019: NT-Pro BNP 482 09/17/2019: TSH 0.223 09/22/2019: Magnesium 2.6 10/03/2019: B Natriuretic Peptide 45.4 01/02/2020: ALT 8; BUN 25; Creatinine, Ser 0.85; Hemoglobin 11.8; Platelets 313; Potassium 3.8; Sodium 143    Lipid Panel No results found for: CHOL, TRIG, HDL, CHOLHDL, VLDL, LDLCALC, LDLDIRECT    Wt Readings from Last 3 Encounters:  01/23/20 210 lb (95.3 kg)  01/12/20 200 lb (90.7 kg)  12/08/19 211 lb (95.7 kg)      Other studies Reviewed: Additional studies/ records that were reviewed today include: Hospital records. Review of the above records demonstrates:  Please see elsewhere in the note.     ASSESSMENT AND PLAN:  Acute on chronic diastolic CHF- Plan a long discussion about volume management and diastolic heart failure.  He does not seem to be tenuous or have any existing problems.  He already watches salt.  We talked about as needed dosing of his diuretic.  No change in therapy.   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  OSA on CPAP He uses CPAP at night.  Carotid stenosis  He has had previous endarterectomy but he has not had surveillance testing in years.  I will order this.  PVD (peripheral vascular disease) (HCC) No change in therapy.  COVID-19 Education: He has been vaccinated.   Current medicines are reviewed at length with the patient today.  The patient does not have concerns regarding medicines.  The following changes have been made:  no change  Labs/ tests ordered today include:   Orders Placed This Encounter  Procedures  . VAS US CAROTID     Disposition:   FU with me as needed.  Signed, Minus Breeding, MD  01/23/2020 2:45 PM    [facility]

## 2020-01-22 NOTE — Progress Notes (Signed)
[name] ([id number]) Visit Report for 01/21/2020 Arrival Information Details Patient Name: Date of Service: Cesar [name] 01/21/2020 2:15 PM Medical Record Number: [id number] Patient Account Number: [id number] Date of Birth/Sex: Treating RN: [birth year] (79 y.o. [name] Primary Care Keria Widrig: [name] Other Clinician: Referring [name]: Treating [name]/Extender: [name] in Treatment: 6 Visit Information History Since Last Visit Added or deleted any medications: No Patient Arrived: Wheel Chair Any new allergies or adverse reactions: No Arrival Time: 14:35 Had a fall or experienced change in No Accompanied By: son-in-law activities of daily living that may affect Transfer Assistance: Harrel Lemon Lift risk of falls: Patient Identification Verified: Yes Signs or symptoms of abuse/neglect since last visito No Secondary Verification Process Completed: Yes Hospitalized since last visit: No Patient Has Alerts: Yes Implantable device outside of the clinic excluding No Patient Alerts: R ABI: 0.89 TBI: 0.64 cellular tissue based products placed in the center L ABI: 0.62 (01/2019) since last visit: Has Dressing in Place as Prescribed: Yes Pain Present Now: No Electronic Signature(s) Signed: 01/22/2020 5:21:32 PM By: [name] RN, BSN Entered By: [name] on 01/21/2020 15:02:30 -------------------------------------------------------------------------------- Encounter Discharge Information Details Patient Name: Date of Service: Cesar [facility] [name] 01/21/2020 2:15 PM Medical Record Number: [id number] Patient Account Number: [id number] Date of Birth/Sex: Treating RN: May 27, 1941 (78 y.o. [name] Primary Care [name]: [name] Other Clinician: Referring [name]: Treating [name]/Extender: [name] in Treatment: 6 Encounter Discharge Information Items Discharge Condition:  Stable Ambulatory Status: Wheelchair Discharge Destination: Home Transportation: Private Auto Accompanied By: son in law Schedule Follow-up Appointment: Yes Clinical Summary of Care: Patient Declined Electronic Signature(s) Signed: 01/21/2020 4:35:51 PM By: Carlene Coria RN Entered By: Carlene Coria on 01/21/2020 16:04:34 -------------------------------------------------------------------------------- [place] Details Patient Name: Date of Service: Cesar [facility], [name] 01/21/2020 2:15 PM Medical Record Number: [id number] Patient Account Number: [id number] Date of Birth/Sex: Treating RN: [birth year] (79 y.o. [name] Primary Care [name]: [name] Other Clinician: Referring [name]: Treating [name]/Extender: [name] in Treatment: 6 Active Inactive Abuse / Safety / Falls / Self Care Management Nursing Diagnoses: Potential for falls Goals: Patient/caregiver will verbalize/demonstrate measures taken to prevent injury and/or falls Date Initiated: 12/10/2019 Target Resolution Date: 02/04/2020 Goal Status: Active Interventions: Assess fall risk on admission and as needed Assess: immobility, friction, shearing, incontinence upon admission and as needed Notes: Nutrition Nursing Diagnoses: Potential for alteratiion in Nutrition/Potential for imbalanced nutrition Goals: Patient/caregiver will maintain therapeutic glucose control Date Initiated: 12/10/2019 Target Resolution Date: 02/04/2020 Goal Status: Active Interventions: Assess HgA1c results as ordered upon admission and as needed Provide education on elevated blood sugars and impact on wound healing Treatment Activities: Patient referred to Primary Care Physician for further nutritional evaluation : 12/10/2019 Notes: Pressure Nursing Diagnoses: Knowledge deficit related to management of pressures ulcers Potential for impaired tissue integrity related to pressure,  friction, moisture, and shear Goals: Patient/caregiver will verbalize understanding of pressure ulcer management Date Initiated: 12/10/2019 Target Resolution Date: 02/04/2020 Goal Status: Active Interventions: Assess: immobility, friction, shearing, incontinence upon admission and as needed Assess offloading mechanisms upon admission and as needed Assess potential for pressure ulcer upon admission and as needed Notes: Wound/Skin Impairment Nursing Diagnoses: Impaired tissue integrity Knowledge deficit related to ulceration/compromised skin integrity Goals: Patient/caregiver will verbalize understanding of skin care regimen Date Initiated: 12/10/2019 Target Resolution Date: 02/04/2020 Goal Status: Active Ulcer/skin breakdown will have a volume reduction of 30%  by week 4 Date Initiated: 12/10/2019 Date Inactivated: 01/21/2020 Target Resolution Date: 01/14/2020 Goal Status: Met Ulcer/skin breakdown will have a volume reduction of 50% by week 8 Date Initiated: 01/21/2020 Target Resolution Date: 02/18/2020 Goal Status: Active Interventions: Assess patient/caregiver ability to obtain necessary supplies Assess patient/caregiver ability to perform ulcer/skin care regimen upon admission and as needed Assess ulceration(s) every visit Provide education on ulcer and skin care Treatment Activities: Skin care regimen initiated : 12/10/2019 Topical wound management initiated : 12/10/2019 Notes: Electronic Signature(s) Signed: 01/21/2020 4:54:27 PM By: Cesar Gouty RN, BSN Entered By: [name] on 01/21/2020 15:38:59 -------------------------------------------------------------------------------- Pain Assessment Details Patient Name: Date of Service: Cesar [place] [facility] [name] 01/21/2020 2:15 PM Medical Record Number: [id number] Patient Account Number: [id number] Date of Birth/Sex: Treating RN: 1941/06/04 (79 y.o. [name] Primary Care Verlyn Dannenberg: [name] Other Clinician: Referring  Tan Clopper: Treating Reginae Wolfrey/Extender: [name] in Treatment: 6 Active Problems Location of Pain Severity and Description of Pain Patient Has Paino No Site Locations Pain Management and Medication Current Pain Management: Electronic Signature(s) Signed: 01/22/2020 5:21:32 PM By: [name] RN, BSN Entered By: [name] on 01/21/2020 15:03:01 -------------------------------------------------------------------------------- Patient/Caregiver Education Details Patient Name: Date of Service: Cesar [facility] M. 8/4/2021andnbsp2:15 PM Medical Record Number: [id number] Patient Account Number: [id number] Date of Birth/Gender: Treating RN: [birth year] (79 y.o. [name] Primary Care Physician: [name] Other Clinician: Referring Physician: Treating Physician/Extender: [name] in Treatment: 6 Education Assessment Education Provided To: Patient Education Topics Provided Pressure: Methods: Explain/Verbal Responses: Reinforcements needed, State content correctly Wound/Skin Impairment: Methods: Explain/Verbal Responses: Reinforcements needed, State content correctly Electronic Signature(s) Signed: 01/21/2020 4:54:27 PM By: Cesar Gouty RN, BSN Entered By: [name] on 01/21/2020 15:40:37 -------------------------------------------------------------------------------- Wound Assessment Details Patient Name: Date of Service: Cesar [facility], [name] 01/21/2020 2:15 PM Medical Record Number: [id number] Patient Account Number: [id number] Date of Birth/Sex: Treating RN: [birth year] (79 y.o. [name] Primary Care Rogerick Baldwin: [name] Other Clinician: Referring Iasia Forcier: Treating Catina Nuss/Extender: [name] in Treatment: 6 Wound Status Wound Number: 1 Primary Pressure Ulcer Etiology: Wound Location: Sacrum Wound Open Wounding Event: Pressure  Injury Status: Date Acquired: 09/18/2019 Comorbid Glaucoma, Chronic Obstructive Pulmonary Disease (COPD), Sleep Weeks Of Treatment: 6 History: Apnea, Congestive Heart Failure, Hypertension, Peripheral Arterial Clustered Wound: No Disease, Type II Diabetes, Gout, Osteoarthritis, Paraplegia, Received Radiation Photos Photo Uploaded By: Mikeal Hawthorne on 01/22/2020 13:39:29 Wound Measurements Length: (cm) 4 Width: (cm) 4 Depth: (cm) 2.7 Area: (cm) 12.566 Volume: (cm) 33.929 % Reduction in Area: 42.9% % Reduction in Volume: 51.8% Epithelialization: Small (1-33%) Tunneling: No Undermining: Yes Starting Position (o'clock): 12 Ending Position (o'clock): 4 Maximum Distance: (cm) 4.5 Wound Description Classification: Category/Stage IV Wound Margin: Well defined, not attached Exudate Amount: Medium Exudate Type: Serosanguineous Exudate Color: red, brown Foul Odor After Cleansing: No Slough/Fibrino Yes Wound Bed Granulation Amount: Large (67-100%) Exposed Structure Granulation Quality: Red, Hyper-granulation Fascia Exposed: No Necrotic Amount: Small (1-33%) Fat Layer (Subcutaneous Tissue) Exposed: Yes Necrotic Quality: Adherent Slough Tendon Exposed: No Muscle Exposed: Yes Necrosis of Muscle: No Joint Exposed: No Bone Exposed: No Treatment Notes Wound #1 (Sacrum) 1. Cleanse With Wound Cleanser 3. Primary Dressing Applied Other primary dressing (specifiy in notes) 4. Secondary Dressing Foam Border Dressing Notes packed with vashe moistened gauze Electronic Signature(s) Signed: 01/21/2020 4:54:27 PM By: Cesar Gouty RN, BSN Signed: 01/22/2020 5:21:32 PM By: [name] RN, BSN Entered By: [name] on 01/21/2020 15:04:09 --------------------------------------------------------------------------------  Wound Assessment Details Patient Name: Date of Service: Cesar [name] 01/21/2020 2:15 PM Medical Record Number: [id number] Patient Account Number:  [id number] Date of Birth/Sex: Treating RN: 1941/04/26 (79 y.o. [name] Primary Care Tyrease Vandeberg: [name] Other Clinician: Referring Donathan Buller: Treating Raider Valbuena/Extender: [name] in Treatment: 6 Wound Status Wound Number: 3 Primary Pressure Ulcer Etiology: Wound Location: Right Achilles Secondary Diabetic Wound/Ulcer of the Lower Extremity Wounding Event: Pressure Injury Etiology: Date Acquired: 09/18/2019 Wound Open Weeks Of Treatment: 6 Status: Clustered Wound: No Comorbid Glaucoma, Chronic Obstructive Pulmonary Disease (COPD), History: Sleep Apnea, Congestive Heart Failure, Hypertension, Peripheral Arterial Disease, Type II Diabetes, Gout, Osteoarthritis, Paraplegia, Received Radiation Photos Photo Uploaded By: Mikeal Hawthorne on 01/22/2020 13:39:30 Wound Measurements Length: (cm) Width: (cm) Depth: (cm) Area: (cm) Volume: (cm) 0 % Reduction in Area: 100% 0 % Reduction in Volume: 100% 0 Epithelialization: Large (67-100%) 0 Tunneling: No 0 Undermining: No Wound Description Classification: Category/Stage III Wound Margin: Flat and Intact Exudate Amount: None Present Foul Odor After Cleansing: No Slough/Fibrino No Wound Bed Granulation Amount: None Present (0%) Exposed Structure Necrotic Amount: None Present (0%) Fascia Exposed: No Fat Layer (Subcutaneous Tissue) Exposed: No Tendon Exposed: No Muscle Exposed: No Joint Exposed: No Bone Exposed: No Electronic Signature(s) Signed: 01/21/2020 4:54:27 PM By: Cesar Gouty RN, BSN Signed: 01/22/2020 5:21:32 PM By: [name] RN, BSN Entered By: [name] on 01/21/2020 15:04:26 -------------------------------------------------------------------------------- [place] Details Patient Name: Date of Service: Cesar [facility], [name] 01/21/2020 2:15 PM Medical Record Number: [id number] Patient Account Number: [id number] Date of Birth/Sex: Treating RN: 1941/05/02 (79 y.o. [name] Primary Care Quamesha Mullet: [name] Other Clinician: Referring Darius Lundberg: Treating Anaijah Augsburger/Extender: [name] in Treatment: 6 Vital Signs Time Taken: 14:50 Temperature (F): 98.4 Height (in): 67 Pulse (bpm): 68 Weight (lbs): 211 Respiratory Rate (breaths/min): 18 Body Mass Index (BMI): 33 Blood Pressure (mmHg): 107/68 Reference Range: 80 - 120 mg / dl Electronic Signature(s) Signed: 01/22/2020 5:21:32 PM By: [name] RN, BSN Entered By: [name] on 01/21/2020 15:02:54

## 2020-01-23 ENCOUNTER — Encounter: Payer: Self-pay | Admitting: Cardiology

## 2020-01-23 ENCOUNTER — Ambulatory Visit: Payer: Medicare Other | Admitting: Cardiology

## 2020-01-23 ENCOUNTER — Other Ambulatory Visit: Payer: Self-pay

## 2020-01-23 VITALS — BP 100/80 | HR 71 | Ht 67.0 in | Wt 210.0 lb

## 2020-01-23 DIAGNOSIS — I6529 Occlusion and stenosis of unspecified carotid artery: Secondary | ICD-10-CM | POA: Diagnosis not present

## 2020-01-23 DIAGNOSIS — I739 Peripheral vascular disease, unspecified: Secondary | ICD-10-CM

## 2020-01-23 DIAGNOSIS — I5033 Acute on chronic diastolic (congestive) heart failure: Secondary | ICD-10-CM

## 2020-01-23 DIAGNOSIS — Z7189 Other specified counseling: Secondary | ICD-10-CM | POA: Diagnosis not present

## 2020-01-23 NOTE — Patient Instructions (Signed)
Medication Instructions:  Your physician recommends that you continue on your current medications as directed. Please refer to the Current Medication list given to you today.  *If you need a refill on your cardiac medications before your next appointment, please call your pharmacy*   Testing/Procedures: Your physician has requested that you have a carotid duplex. This test is an ultrasound of the carotid arteries in your neck. It looks at blood flow through these arteries that supply the brain with blood. Allow one hour for this exam. There are no restrictions or special instructions.   Follow-Up: At [facility], you and your health needs are our priority.  As part of our continuing mission to provide you with exceptional heart care, we have created designated Provider Care Teams.  These Care Teams include your primary Cardiologist (physician) and Advanced Practice Providers (APPs -  Physician Assistants and Nurse Practitioners) who all work together to provide you with the care you need, when you need it.  We recommend signing up for the patient portal called "MyChart".  Sign up information is provided on this After Visit Summary.  MyChart is used to connect with patients for Virtual Visits (Telemedicine).  Patients are able to view lab/test results, encounter notes, upcoming appointments, etc.  Non-urgent messages can be sent to your provider as well.   To learn more about what you can do with MyChart, go to [email or web address].    Your next appointment:   As Needed

## 2020-01-27 ENCOUNTER — Other Ambulatory Visit (HOSPITAL_COMMUNITY): Payer: Self-pay | Admitting: Cardiology

## 2020-01-27 ENCOUNTER — Ambulatory Visit (HOSPITAL_COMMUNITY)
Admission: RE | Admit: 2020-01-27 | Discharge: 2020-01-27 | Disposition: A | Discharge status: Home / Self Care | Payer: Medicare Other | Source: Ambulatory Visit | Attending: Cardiovascular Disease | Admitting: Cardiovascular Disease

## 2020-01-27 ENCOUNTER — Other Ambulatory Visit: Payer: Self-pay

## 2020-01-27 DIAGNOSIS — I6529 Occlusion and stenosis of unspecified carotid artery: Secondary | ICD-10-CM | POA: Diagnosis not present

## 2020-01-27 DIAGNOSIS — I6523 Occlusion and stenosis of bilateral carotid arteries: Secondary | ICD-10-CM

## 2020-01-27 DIAGNOSIS — Z9889 Other specified postprocedural states: Secondary | ICD-10-CM

## 2020-01-30 ENCOUNTER — Other Ambulatory Visit: Payer: Self-pay

## 2020-01-30 DIAGNOSIS — I6529 Occlusion and stenosis of unspecified carotid artery: Secondary | ICD-10-CM

## 2020-02-04 ENCOUNTER — Encounter (HOSPITAL_BASED_OUTPATIENT_CLINIC_OR_DEPARTMENT_OTHER): Payer: Medicare Other | Admitting: Physician Assistant

## 2020-02-04 DIAGNOSIS — L89150 Pressure ulcer of sacral region, unstageable: Secondary | ICD-10-CM | POA: Diagnosis not present

## 2020-02-04 NOTE — Progress Notes (Addendum)
[name], [name] ([id number]) Visit Report for 02/04/2020 Chief Complaint Document Details Patient Name: Date of Service: Cesar [name] 02/04/2020 9:45 A M Medical Record Number: [id number] Patient Account Number: [id number] Date of Birth/Sex: Treating RN: 20-Mar-1941 (79 y.o. [name] Primary Care Provider: Leanna [name] Other Clinician: Referring Provider: Treating Provider/Extender: [name] in Treatment: 8 Information Obtained from: Patient Chief Complaint Sacral, right achilles, left ankle, and right dorsal foot ulcers Electronic Signature(s) Signed: 02/04/2020 9:55:03 AM By: Worthy Keeler PA-C Entered By: Worthy Keeler on 02/04/2020 09:55:02 -------------------------------------------------------------------------------- HPI Details Patient Name: Date of Service: Cesar [facility], [name] 02/04/2020 9:45 A M Medical Record Number: [id number] Patient Account Number: [id number] Date of Birth/Sex: Treating RN: [birth year] (79 y.o. [name] Primary Care Provider: Leanna [name] Other Clinician: Referring Provider: Treating Provider/Extender: [name] in Treatment: 8 History of Present Illness HPI Description: 12/10/2019 upon evaluation today patient appears for initial evaluation here in our clinic unfortunately due to having multiple pressure injuries noted at this point. Including in the sacral region, left ankle, and Achilles region. He also has an ulcer on top of his foot but this appears to be mainly dry I do not even know that there is really a major issue here but nonetheless we are going to continue to monitor this and to the eschar pops off. He still is having some pain he also had apparently some infection around this area that his primary care provider placed him on antibiotic for. The patient does have hypertension and also muscle weakness that came on all of a sudden at the beginning of this year  around March and unfortunately he has not been able to walk since that time prior to that he was walking with some weakness due to spinal stenosis but nothing like what he is experiencing right now. He does spend a lot of his day sitting though he does reposition in his chair I am not sure if that is sufficient or not we will have to see how things progress. Nonetheless the sacral wound is definitely the worst part of everything that he has at this point. He does have diabetes which is very well controlled most recent hemoglobin A1c was 5.4. 12/17/2019 upon evaluation today patient appears to be doing decently well in regard to several of his ulcers on the foot and ankle region. I am very pleased with this. The Achilles area is also doing well. In fact the dorsal surface of the foot is completely healed and this is great news. The sacral region however is another story he tends to sit in his chair most of the day leaning back and I think this is putting pressure on this area even though he is trying to get in a position that is comfortable for him unfortunately I think this is leading to him having more issues with skin breakdown and I think this wound is good to get worse not better if were not careful. I explained that to the patient and his wife today during the evaluation. 7/15; patient I have not seen previously. 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 He also has an area on the left lateral malleolus and the Achilles area. The area on his left lateral malleolus and Achilles are doing well using silver collagen He spent a protracted period of time in the hospital back and forth I think  between the rehab unit largely related to lower back and cervical spine issues. He is no longer ambulatory. During this time he developed a pressure ulcer on his lower sacrum. When he came here this was unstageable it is now more clean wound with clearly exposed bone. It  has a very distinct odor and apparently that is been true on previous visits as well. Last albumin ICU was 3.4. He is religiously turning and offloading this area spending most of his time in bed. He is apparently eating well 01/07/2020 upon evaluation today patient appears to be doing better in regard to his foot and Achilles region. Overall very pleased in that regard. Unfortunately he is not doing quite as well with regard to the sacrum although this is better than when I last saw it and appears to be better as well according to nursing staff as compared to last week's evaluation when I was not here. The wound is deeper but again is cleaned up much more effectively I do believe packing with the gauze as we have been is still the appropriate thing to do. Fortunately there is no signs in general of active infection at this point. 01/21/2020 on evaluation today patient actually appears to be doing quite well in regard to his lower extremity ulcers in fact everything appears to be healed today which is great news there is no signs of active infection at this time. No fevers, chills, nausea, vomiting, or diarrhea. With regard to the sacral wound he is also doing quite well in this regard I feel like he is making excellent progress which is great news. 02/04/2020 on evaluation today patient appears to be doing well with regard to his wound. He has been tolerating dressing changes without complication. Fortunately there is no signs of active infection at this time. No fevers, chills, nausea, vomiting, or diarrhea. With that being said he does have a little bit of a rash under his underarm on the right which they do have a little bit of a concerned about at this time. I will evaluate that today as well. Electronic Signature(s) Signed: 02/04/2020 10:46:25 AM By: Worthy Keeler PA-C Entered By: Worthy Keeler on 02/04/2020  10:46:25 -------------------------------------------------------------------------------- Chemical Cauterization Details Patient Name: Date of Service: Cesar [facility], [name] 02/04/2020 9:45 A M Medical Record Number: [id number] Patient Account Number: [id number] Date of Birth/Sex: Treating RN: [birth year] (79 y.o. [name] Primary Care Provider: Leanna [name] Other Clinician: Referring Provider: Treating Provider/Extender: [name] in Treatment: 8 Procedure Performed for: Wound #1 Sacrum Performed By: Physician Worthy Keeler, PA Post Procedure Diagnosis Same as Pre-procedure Notes using silver nitrate stick Electronic Signature(s) Signed: 02/04/2020 5:00:27 PM By: Worthy Keeler PA-C Signed: 02/04/2020 5:37:29 PM By: Baruch Gouty RN, BSN Entered By: Baruch Gouty on 02/04/2020 10:35:58 -------------------------------------------------------------------------------- Physical Exam Details Patient Name: Date of Service: Cesar [facility], [name] 02/04/2020 9:45 A M Medical Record Number: [id number] Patient Account Number: [id number] Date of Birth/Sex: Treating RN: 1940-08-14 (79 y.o. [name] Primary Care Provider: Leanna [name] Other Clinician: Referring Provider: Treating Provider/Extender: [name] in Treatment: 8 Constitutional Well-nourished and well-hydrated in no acute distress. Respiratory normal breathing without difficulty. Psychiatric this patient is able to make decisions and demonstrates good insight into disease process. Alert and Oriented x 3. pleasant and cooperative. Notes Upon inspection patient did have a little bit of a rash under the right axillary region which does appear to  be consistent to be honest with a fungal infection and that was discussed with the patient and his son-in-law today. I do believe that he would benefit from some nystatin powder here to keep it dry as well as  treat the fungal infection there and agreement with that plan. In regard to the sacral wound is actually appears to be doing better I did actually give him a treatment again today with the silver nitrate to try to help with some of the hyper granulation that seems to have done excellent. Overall I am very pleased with where things seem to be progressing. I do believe however the patient will benefit from a wound VAC as far as speeding up his healing is concerned. Electronic Signature(s) Signed: 02/04/2020 10:47:22 AM By: Worthy Keeler PA-C Entered By: Worthy Keeler on 02/04/2020 10:47:22 -------------------------------------------------------------------------------- Physician Orders Details Patient Name: Date of Service: Cesar [facility], [name] 02/04/2020 9:45 A M Medical Record Number: [id number] Patient Account Number: [id number] Date of Birth/Sex: Treating RN: Oct 07, 1940 (79 y.o. [name] Primary Care Provider: Leanna [name] Other Clinician: Referring Provider: Treating Provider/Extender: [name] in Treatment: 8 Verbal / Phone Orders: No Diagnosis Coding ICD-10 Coding Code Description L89.154 Pressure ulcer of sacral region, stage 4 E11.621 Type 2 diabetes mellitus with foot ulcer I10 Essential (primary) hypertension M62.81 Muscle weakness (generalized) Follow-up Appointments ppointment in 2 weeks. - ****[facility] - ROOM 5**** Return A Dressing Change Frequency Wound #1 Sacrum Change dressing every day. Change Dressing every other day. Wound Cleansing Wound #1 Sacrum Clean wound with Wound Cleanser Primary Wound Dressing Wound #1 Sacrum Silver Collagen - line wound bed with silver collagen Other: - saline moistened gauze packing behind collagen Secondary Dressing Wound #1 Sacrum ABD pad - or foam border Negative Presssure Wound Therapy Medela Wound Vac continuously at 132mm/hg - home health to initiate and change 3 times per week  when available (wound center to order) Black Foam Off-Loading Multipodus Splint to: - or bunny boots to both feet especially when in bed A fluidized (Group 3) mattress - wound center to order ir Turn and reposition every 2 hours [facility] skilled nursing for wound care. - [facility] Electronic Signature(s) Signed: 02/04/2020 5:00:27 PM By: Worthy Keeler PA-C Signed: 02/04/2020 5:37:29 PM By: Baruch Gouty RN, BSN Entered By: Baruch Gouty on 02/04/2020 11:40:11 -------------------------------------------------------------------------------- Problem List Details Patient Name: Date of Service: Cesar [facility], [name] 02/04/2020 9:45 A M Medical Record Number: [id number] Patient Account Number: [id number] Date of Birth/Sex: Treating RN: 1940-10-25 (79 y.o. [name] Primary Care Provider: Leanna [name] Other Clinician: Referring Provider: Treating Provider/Extender: [name] in Treatment: 8 Active Problems ICD-10 Encounter Code Description Active Date MDM Diagnosis L89.154 Pressure ulcer of sacral region, stage 4 12/10/2019 No Yes E11.621 Type 2 diabetes mellitus with foot ulcer 12/10/2019 No Yes I10 Essential (primary) hypertension 12/10/2019 No Yes M62.81 Muscle weakness (generalized) 12/10/2019 No Yes B35.4 Tinea corporis 02/04/2020 No Yes Inactive Problems Resolved Problems ICD-10 Code Description Active Date Resolved Date L89.520 Pressure ulcer of left ankle, unstageable 12/10/2019 12/10/2019 L89.893 Pressure ulcer of other site, stage 3 12/10/2019 12/10/2019 L97.518 Non-pressure chronic ulcer of other part of right foot with other specified severity 12/10/2019 12/10/2019 Electronic Signature(s) Signed: 02/04/2020 10:46:20 AM By: Worthy Keeler PA-C Previous Signature: 02/04/2020 9:54:57 AM Version By: Worthy Keeler PA-C Entered By: Worthy Keeler on 02/04/2020  10:46:20 -------------------------------------------------------------------------------- Progress Note Details  Patient Name: Date of Service: Cesar [place], [place]. 02/04/2020 9:45 A M Medical Record Number: [id number] Patient Account Number: [id number] Date of Birth/Sex: Treating RN: 1940-07-11 (79 y.o. [name] Primary Care Provider: Other Clinician: Leanna [name] Referring Provider: Treating Provider/Extender: [name] in Treatment: 8 Subjective Chief Complaint Information obtained from Patient Sacral, right achilles, left ankle, and right dorsal foot ulcers History of Present Illness (HPI) 12/10/2019 upon evaluation today patient appears for initial evaluation here in our clinic unfortunately due to having multiple pressure injuries noted at this point. Including in the sacral region, left ankle, and Achilles region. He also has an ulcer on top of his foot but this appears to be mainly dry I do not even know that there is really a major issue here but nonetheless we are going to continue to monitor this and to the eschar pops off. He still is having some pain he also had apparently some infection around this area that his primary care provider placed him on antibiotic for. The patient does have hypertension and also muscle weakness that came on all of a sudden at the beginning of this year around March and unfortunately he has not been able to walk since that time prior to that he was walking with some weakness due to spinal stenosis but nothing like what he is experiencing right now. He does spend a lot of his day sitting though he does reposition in his chair I am not sure if that is sufficient or not we will have to see how things progress. Nonetheless the sacral wound is definitely the worst part of everything that he has at this point. He does have diabetes which is very well controlled most recent hemoglobin A1c was 5.4. 12/17/2019 upon  evaluation today patient appears to be doing decently well in regard to several of his ulcers on the foot and ankle region. I am very pleased with this. The Achilles area is also doing well. In fact the dorsal surface of the foot is completely healed and this is great news. The sacral region however is another story he tends to sit in his chair most of the day leaning back and I think this is putting pressure on this area even though he is trying to get in a position that is comfortable for him unfortunately I think this is leading to him having more issues with skin breakdown and I think this wound is good to get worse not better if were not careful. I explained that to the patient and his wife today during the evaluation. 7/15; patient I have not seen previously. 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 He also has an area on the left lateral malleolus and the Achilles area. The area on his left lateral malleolus and Achilles are doing well using silver collagen He spent a protracted period of time in the hospital back and forth I think between the rehab unit largely related to lower back and cervical spine issues. He is no longer ambulatory. During this time he developed a pressure ulcer on his lower sacrum. When he came here this was unstageable it is now more clean wound with clearly exposed bone. It has a very distinct odor and apparently that is been true on previous visits as well. Last albumin ICU was 3.4. He is religiously turning and offloading this area spending most of his time in bed. He is apparently  eating well 01/07/2020 upon evaluation today patient appears to be doing better in regard to his foot and Achilles region. Overall very pleased in that regard. Unfortunately he is not doing quite as well with regard to the sacrum although this is better than when I last saw it and appears to be better as well according to nursing staff as compared  to last week's evaluation when I was not here. The wound is deeper but again is cleaned up much more effectively I do believe packing with the gauze as we have been is still the appropriate thing to do. Fortunately there is no signs in general of active infection at this point. 01/21/2020 on evaluation today patient actually appears to be doing quite well in regard to his lower extremity ulcers in fact everything appears to be healed today which is great news there is no signs of active infection at this time. No fevers, chills, nausea, vomiting, or diarrhea. With regard to the sacral wound he is also doing quite well in this regard I feel like he is making excellent progress which is great news. 02/04/2020 on evaluation today patient appears to be doing well with regard to his wound. He has been tolerating dressing changes without complication. Fortunately there is no signs of active infection at this time. No fevers, chills, nausea, vomiting, or diarrhea. With that being said he does have a little bit of a rash under his underarm on the right which they do have a little bit of a concerned about at this time. I will evaluate that today as well. Objective Constitutional Well-nourished and well-hydrated in no acute distress. Vitals Time Taken: 10:16 AM, Height: 67 in, Weight: 211 lbs, BMI: 33, Temperature: 97.8 F, Pulse: 67 bpm, Respiratory Rate: 18 breaths/min, Blood Pressure: 107/68 mmHg. Respiratory normal breathing without difficulty. Psychiatric this patient is able to make decisions and demonstrates good insight into disease process. Alert and Oriented x 3. pleasant and cooperative. General Notes: Upon inspection patient did have a little bit of a rash under the right axillary region which does appear to be consistent to be honest with a fungal infection and that was discussed with the patient and his son-in-law today. I do believe that he would benefit from some nystatin powder here to keep  it dry as well as treat the fungal infection there and agreement with that plan. In regard to the sacral wound is actually appears to be doing better I did actually give him a treatment again today with the silver nitrate to try to help with some of the hyper granulation that seems to have done excellent. Overall I am very pleased with where things seem to be progressing. I do believe however the patient will benefit from a wound VAC as far as speeding up his healing is concerned. Integumentary (Hair, Skin) Wound #1 status is Open. Original cause of wound was Pressure Injury. The wound is located on the Sacrum. The wound measures 5cm length x 1.8cm width x 2.8cm depth; 7.069cm^2 area and 19.792cm^3 volume. There is Fat Layer (Subcutaneous Tissue) Exposed exposed. There is no tunneling noted, however, there is undermining starting at 1:00 and ending at 4:00 with a maximum distance of 3.2cm. There is a medium amount of serosanguineous drainage noted. The wound margin is well defined and not attached to the wound base. There is large (67-100%) red, hyper - granulation within the wound bed. There is a small (1- 33%) amount of necrotic tissue within the wound bed including Adherent  Slough. 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 An Chemical Cauterization procedure was performed by Worthy Keeler, PA. Post procedure Diagnosis Wound #1: Same as Pre-Procedure Notes: using silver nitrate stick Plan Follow-up Appointments: Return Appointment in 2 weeks. - ****[facility] - ROOM 5**** Dressing Change Frequency: Wound #1 Sacrum: Change dressing every day. Change Dressing every other day. Wound Cleansing: Wound #1 Sacrum: Clean wound with Wound  Cleanser Primary Wound Dressing: Wound #1 Sacrum: Silver Collagen - line wound bed with silver collagen Other: - saline moistened gauze packing behind collagen Secondary Dressing: Wound #1 Sacrum: ABD pad - or foam border Negative Presssure Wound Therapy: Medela Wound Vac continuously at 179mm/hg - home health to initiate and change 3 times per week when available (wound center to order) Black Foam Off-Loading: Multipodus Splint to: - or bunny boots to both feet especially when in bed Air fluidized (Group 3) mattress - wound center to order Turn and reposition every 2 hours Home Health: [facility] skilled nursing for wound care. - [facility] 1. I am recommend currently that we see about initiation of a wound VAC for the patient and again specifically I think this can cut back on the patient's healing length of time. It will help granulate in much more effectively than just traditional standard dressings alone. 2. I am also can recommend that we go ahead and order the fluidized air mattress apparently that was just in order that was missed last week we did apologize about that as well. 3. I am also can recommend that we go ahead and have the patient continue with the cushioned boots to help protect his feet especially when in bed. 4. I am also going to suggest that the dressing of choice be collagen to the base of the wound followed by silver alginate. We will then use just moistened gauze to pack and behind and fill any additional space. We will see patient back for reevaluation in 2 weeks here in the clinic. If anything worsens or changes patient will contact our office for additional recommendations. Electronic Signature(s) Signed: 02/04/2020 5:00:27 PM By: Worthy Keeler PA-C Signed: 02/04/2020 5:37:29 PM By: Baruch Gouty RN, BSN Previous Signature: 02/04/2020 10:48:47 AM Version By: Worthy Keeler PA-C Entered By: Baruch Gouty on 02/04/2020  11:40:45 -------------------------------------------------------------------------------- SuperBill Details Patient Name: Date of Service: Cesar [facility], [name] 02/04/2020 Medical Record Number: [id number] Patient Account Number: [id number] Date of Birth/Sex: Treating RN: 1940-09-07 (78 y.o. [name] Primary Care Provider: Leanna [name] Other Clinician: Referring Provider: Treating Provider/Extender: [name] in Treatment: 8 Diagnosis Coding ICD-10 Codes Code Description [phone] Pressure ulcer of sacral region, stage 4 E11.621 Type 2 diabetes mellitus with foot ulcer I10 Essential (primary) hypertension M62.81 Muscle weakness (generalized) B35.4 Tinea corporis Facility Procedures CPT4 Code: [id number] 1 Description: 9242 - CHEM CAUT GRANULATION TISS ICD-10 Diagnosis Description L89.154 Pressure ulcer of sacral region, stage 4 Modifier: Quantity: 1 Physician Procedures : CPT4 Code Description Modifier [id number] - WC PHYS LEVEL 4 - EST PT 25 ICD-10 Diagnosis Description L89.154 Pressure ulcer of sacral region, stage 4 E11.621 Type 2 diabetes mellitus with foot ulcer I10 Essential (primary) hypertension B35.4 Tinea  corporis Quantity: 1 : [id number] - WC PHYS CHEM CAUT GRAN TISSUE ICD-10  Diagnosis Description L89.154 Pressure ulcer of sacral region, stage 4 Quantity: 1 Electronic Signature(s) Signed: 02/04/2020 10:49:33 AM By: Worthy Keeler PA-C Entered By: Worthy Keeler on 02/04/2020 10:49:33

## 2020-02-05 ENCOUNTER — Encounter: Payer: Self-pay | Admitting: Neurology

## 2020-02-05 ENCOUNTER — Ambulatory Visit: Payer: Medicare Other | Admitting: Neurology

## 2020-02-05 VITALS — BP 113/63 | HR 66 | Ht 67.0 in

## 2020-02-05 DIAGNOSIS — G629 Polyneuropathy, unspecified: Secondary | ICD-10-CM | POA: Diagnosis not present

## 2020-02-05 DIAGNOSIS — G608 Other hereditary and idiopathic neuropathies: Secondary | ICD-10-CM | POA: Diagnosis not present

## 2020-02-05 DIAGNOSIS — M48061 Spinal stenosis, lumbar region without neurogenic claudication: Secondary | ICD-10-CM

## 2020-02-05 DIAGNOSIS — G4731 Primary central sleep apnea: Secondary | ICD-10-CM | POA: Diagnosis not present

## 2020-02-05 DIAGNOSIS — M5416 Radiculopathy, lumbar region: Secondary | ICD-10-CM

## 2020-02-05 DIAGNOSIS — G114 Hereditary spastic paraplegia: Secondary | ICD-10-CM

## 2020-02-05 NOTE — Progress Notes (Signed)
[facility]   Provider:  Larey Seat, MD   Referring Provider: [name], MD   Primary Care Physician:  [name], MD  02-05-2020,  I am meeting today with [name] , a meanwhile 79 year old patient of our sleep clinic. Our last conversation was on June 8th 2020 by virtual visit.  Mr. Cornia his wife injured about a fractured both of her femurs in June so both Mr. and Mrs. Symes are now in need of home health care and attention.  Over the year 2018 Mr. 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  He also had been previously followed here already for complex sleep apnea syndrome.  He had myoclonic jerks and spasms that did not qualify anymore as restless legs but are much more severe than that.  He also has reported difficulties with controlling bladder and bowel and he now has a catheter.  He presents today in a wheelchair.  I would like to reiterate that in 2019 the patient was diagnosed with a cervical spine stenosis with anterior posterior diameter reduction to 5 mm causing spinal cord compression and myelopathic signal changes.  Severe bilateral foraminal narrowing.  Surgical decompression was performed by Dr. Saintclair Halsted at the time.  He is now 18 months almost 20 months since his neurosurgical intervention he has resided for a while in a rehab facility.  In March 2021 he was readmitted to hospital for uncontrollable involuntary movements of the lower extremities he developed a bedsore in March followed by pneumonia and stayed a total of 8 weeks in the hospital.  Dr. Deatra Ina then performed a lumbar laminectomy L3-L4.  Myelopathy and severe stenosis had been found on the myelographic study.  It was at least of hope that it would help the patient to  regain strength in his lower extremities and maybe even continence..  This has so far not been the case.  He was discharged from rehab where he stayed from 24 May to 25 November 2019.  His legs feel asleep all the time, he can't stand - certainly can't walk. At home PT has quit. Has  Bedsore.   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  Also neurogenic bowel and bladder, chronic diastolic congestive heart failure, chronic respiratory failure.      HPI: I had the pleasure of meeting today with Clemencia Course, MD 79 year old gentleman seen on 20 May 2018 after a 43-monthhiatus. In my last visit the patient has reported difficulties with controlling his bladder and bowel, he had become very weak both lower extremities were affected but also he was leaning towards his right side and felt that there was weakness of the right upper extremity and torso.  I obtained first an MRI of the brain with and without contrast which did not show an onset of, there was moderate to moderate generalized cortical atrophy and 1 minimal chronic microvascular changes overall healthy for age.  Dr. SMariea Stablethen followed with a cervical spine MRI which showed severe spinal stenosis with an anterior posterior diameter of 5 mm, causing spinal cord compression and myelopathic signal changes within the spinal cord.  There were severe bilateral foraminal narrowing noticed especially at C4 there was more moderate stenosis between C4-5,  again severe stenosis at C5 and C6, and again C6 and C7 had only mild spinal stenosis.  The patient was immediately referred for surgical decompression with Dr. Saintclair Halsted neurosurgery.  There were also multilevel degenerative changes at the lower spine but none of this was comparable to the cervical spine findings.  He is now 7 month post surgical intervention and has had PT, rehab.  He is not in pain, has less myoclonic jerks.    I  have followed Mr. CANDIDO FLOTT mainly for a sleep disorder and he has been 100% compliant CPAP user using an AutoSet between 12 and 20 cmH2O was 2 cm EPR, he has a lots of air leaks which causes erroneously high apnea problems.  He also struggles with bronchitis and a productive cough and has a nasal septal deviation that makes it harder to breathe through the nose.  He endorsed today the fatigue severity scale at 50, the Epworth Sleepiness Scale at 20 out of 24 points and the geriatric depression scale at 7 out of 15 points. He has a bruised nasal bridge which comes as a result from wearing the mask too tightly.       SHLOIMA CLINCH is a 79 y.o. male is seen here today, 07-12-2017 , for intermittent leg jerking movements as well as irregular loss of muscle tone and control of the lower extremities.  His knees may buckle without warning.  Other times he was able to walk.  He is currently residing in a nursing facility and the goal is to get him home, but his rehab has been hampered by the unpredictable exercise tolerance and participation. Around Christmas time he suffered a urinary tract infection which set him back quite a bit, on January 2 he was admitted to hospital he required IV antibiotics, on the fifth he was discharged from hospital to a rehab-nursing home, where he still stays.  While his admission diagnosis was a urosepsis-urinary tract infection he will now need a new diagnosis to continue with rehab.  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  He had 2 replacement of the left knee with in a month, dating back to the year 2012 be relieved.  There has been no surgery to the right leg neither hip nor knee have been no back surgeries or spinal interventions. The myoclonic jerks are much more dominantly expressed with the right lower extremity, but his knees buckle on both sides but not necessarily at the same time. His feet  don't lift well of the ground, especially the right.     01 May 2017.  He has again been a very compliant CPAP use of his 100% of the last 30 days daily use and 80% by hours.  Average use at times 4 hours 42 minutes, the patient uses an AutoSet between 12 and 18 cmH2O was wanted me to EPR is a residual AHI of 11.1.  The vast majority of these residual apneas are obstructive in nature usually means that he does need a little bit more pressure.  The 95th percentile pressure was 17.5 cm water.  I would like to increase the maximum pressure to 20 cm was 2 cm EPR.  He will stay on the memory foam lined mask the so-called air touch interface.  He likes it and it has reduced air leaks significantly.  He reports that his spouse still feels that there is too much air leak however it has not woken him up she is waking him  up.  His fatigue severity score today was endorsed to 22 points but his Epworth sleepiness score is still high at 15 points, the geriatric depression score was endorsed at only 1 out of 15 points. He is happy with his results, more alertness.    10-30-2016, Mr. Luu has been, as usual, and excellent compliant patient. He has used CPAP was 90% compliance and an average of 5 hours and 90 minutes daily, uses an AutoSet between 12 and 18 cm water was 1 cm EPR and hasn't residual apnea index of 5.8. This is a little higher than I like it's but I think it is related to higher air leaks. The 95th percentile pressure was 17.1, which is an unusual high pressure needs. The air leaks were remarkably high. The patient sometimes is aware at night that there is an air leak but not every night I would like for him to be refitted to a different interface. I would like him to try an air touch fullface mask, which has no resting point on the forehead. He also endorsed 16 points on his Epworth Sleepiness Scale which is too high, and 43 points on the fatigue. His geriatric depression score on the other hand was only endorsed at one out of 15 points. He continues to use multiple  medications including ropinirole 1 mg in the morning and 4 mg at night for restless leg control. Carbidopa levodopa2 times a day for RLS, he also takes metoprolol, levo-thyroxin,  Rizatriptan,  and allopurinol. Various over-the-counter medication-nutritional supplements. RLS goes away after sinemet intake. 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  Revisit note from 07/01/2015. I me today with Mr. Ohms, who has an excellent compliance on his CPAP  S 9 machine. We interrogated the machine by wife 5 today here in the office and it revealed that his AHI is 6.7 with 100% compliance for days of use and an average user time of 6 hours and 6 minutes. He is using an AutoSet between 12 and 18 cm water pressure was 1 cm EPR the 95th percentile pressure is 16. He does have sometimes significant air leaks that the mask leak he is using a full face mask as evident by the pressure marks at least on his face.he cannot tolerate nasal airflow.   He has bronchitic breathing and has been short of air while talking.  His gout remains well controlled, but her mentioned how expensive his medications are , needed to achieve this control.  He reports restless legs and filled the Johns-Hopkins Questionnaire for quality of life in RLS.  The patient has been long time established, and  underwent a split study in 2011 at [facility]. His AHI was 77 per hour and he was titrated to 16 cm water pressure with a 3 cm EPR. The residual AHI became lower than 5. A download reorder from August 2013 showed 100% compliance. I was unable today to obtain the data for 2014 but asked Mr. Vanvoorhis 'es DME - [facility] here in [place]) to provide [place] with the most recent data. He got this download per [name] today.  Today the download shows been 96% compliance over the last 90 days, average time of CPAP use per day at 6 hours and 52 minutes, residual AHI is 6.9 and the sac pressure is 16 cm water with 3 cm EPR there is a moderately high air leak,  the pressure is so high his mask blows off his face. He needs a new  mask and headgear soon.  I would like for him to have an auto-titrator machine with a setting from 8 through 16 cm water . He states he never sleeps without his CPAP and likes the machines effect on his sleep, rest, agility.  He has 2- 3 nocturia breaks interrupting his sleep. Bedtime is between 23 hours and midnight. Subjective getting 7-8 hours of sleep , wakes up spontaneously at 7 AM.         Review of Systems: Out of a complete 14 system review, the patient complains of only the following symptoms, and all other reviewed systems are negative.  SOB, fatigue reduced. GDS one points,  How likely are you to doze in the following situations: 0 = not likely, 1 = slight chance, 2 = moderate chance, 3 = high chance  Sitting and Reading? 2 Watching Television?3 Sitting inactive in a public place (theater or meeting)?2 Lying down in the afternoon when circumstances permit?2 Sitting and talking to someone?1 Sitting quietly after lunch without alcohol?1 In a car, while stopped for a few minutes in traffic?0 As a passenger in a car for an hour without a break?1  He adjusted his diet , his wife no longer cooks fried foods, more whole wheat , salads.  His son is a Child psychotherapist and supplies the couple with whole wheat .  Daughter and son in law provide in home care.  Breakfast with caffeine , 2-3 cups of coffee. Non smoker,  Non drinker.      Social History   Socioeconomic History  . Marital status: Married    Spouse name: [name]  . Number of children: 2  . Years of education: COLLEGE  . Highest education level: Not on file  Occupational History    Employer: RETIRED  Tobacco Use  . Smoking status: Former Smoker    Years: 40.00    Types: Cigars, Cigarettes    Quit date: 06/19/2005    Years since quitting: 14.6  . Smokeless tobacco: Never Used  . Tobacco comment: smoked 4-6 cigars daily, only smoked cigarettes socially   Vaping Use  . Vaping Use: Never used  Substance and Sexual Activity  . Alcohol use: No  . Drug use: No  . Sexual activity: Not on file  Other Topics Concern  . Not on file  Social History Narrative   Patient is married [name]) and lives at home with his wife.   Patient has two children.   Patient is retired.   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   Patient is right-handed.   Patient drinks three cups of coffee per day.   Social Determinants of Health   Financial Resource Strain:   . Difficulty of Paying Living Expenses: Not on file  Food Insecurity:   . Worried About Charity fundraiser in the Last Year: Not on file  . Ran Out of Food in the Last Year: Not on file  Transportation Needs:   . Lack of Transportation (Medical): Not on file  . Lack of Transportation (Non-Medical): Not on file  Physical Activity:   . Days of Exercise per Week: Not on file  . Minutes of Exercise per Session: Not on file  Stress:   . Feeling of Stress : Not on file  Social Connections:   . Frequency of Communication with Friends and Family: Not on file  . Frequency of Social Gatherings with Friends and Family: Not on file  . Attends Religious Services: Not on  file  . Active Member of Clubs or Organizations: Not on file  . Attends Archivist Meetings: Not on file  . Marital Status: Not on file  Intimate Partner Violence:   . Fear of Current or Ex-Partner: Not on file  . Emotionally Abused: Not on file  . Physically Abused: Not on file  . Sexually Abused: Not on file    Family History  Problem Relation Age of Onset  . COPD Mother   . Hyperlipidemia Mother   . Hypertension Mother   . Diabetes Father   . Kidney disease Father        ESRD  . Hypertension Father   . Cancer Father   . Hyperlipidemia Father     Past Medical History:  Diagnosis Date  . Arthritis   . Barrett esophagus   . Bronchitis   . Carotid artery occlusion   . COPD (chronic  obstructive pulmonary disease) ([place])   . Diabetes mellitus without complication ([place])   . GERD (gastroesophageal reflux disease)   . Headache    migraines  . History of thyroid cancer 2008  . Hypertension   . Restless leg   . Sleep apnea with use of continuous positive airway pressure (CPAP)    2011 [facility] , AHI  77cn central and obstrcutive. 16 cm water , 3 cm EPR.   . Thoracic ascending aortic aneurysm ([facility])    4.2 cm ascending TAA 08/18/17 CTA. Annual imaging recommended.  . thyroid ca dx'd 2009 or 2010   surg and radioactive isoptope  . Ulcer    Peptic ulcer disease    Past Surgical History:  Procedure Laterality Date  . ANTERIOR CERVICAL DECOMP/DISCECTOMY FUSION N/A 09/03/2017   Procedure: ACDF - C3-C4 - C4-C5 - C5-C6;  Surgeon: Kary Kos, MD;  Location: [facility];  Service: Neurosurgery;  Laterality: N/A;  . CAROTID ENDARTERECTOMY Left January 03, 2006   Dr. Amedeo Plenty  . COLONOSCOPY    . EYE SURGERY Bilateral    cataract surgery with lens implants  . game keepers thumb Right   . JOINT REPLACEMENT Left    knee X 2  . LUMBAR LAMINECTOMY/ DECOMPRESSION WITH MET-RX N/A 11/04/2019   Procedure: LUMBAR THREE-FOUR LUMBAR LAMINECTOMY/ DECOMPRESSION WITH MET-RX;  Surgeon: Judith Part, MD;  Location: [facility];  Service: Neurosurgery;  Laterality: N/A;  . RADIOLOGY WITH ANESTHESIA N/A 10/16/2019   Procedure: MRI THORACIC LUMBAR SPINE;  Surgeon: Radiologist, Medication, MD;  Location: [facility];  Service: Radiology;  Laterality: N/A;  . ROTATOR CUFF REPAIR Right   . THYROIDECTOMY  2008    Current Outpatient Medications  Medication Sig Dispense Refill  . acetaminophen (TYLENOL) 325 MG tablet Take 2 tablets (650 mg total) by mouth every 6 (six) hours as needed (pain or temperature).    [name] albuterol (PROVENTIL) (2.5 MG/3ML) 0.083% nebulizer solution Inhale 3 mLs (2.5 mg total) into the lungs every 4 (four) hours as needed for wheezing or shortness of breath. 75 mL 12  . albuterol (VENTOLIN  HFA) 108 (90 Base) MCG/ACT inhaler Inhale 1 puff into the lungs every 4 (four) hours as needed for wheezing or shortness of breath. 6.7 g 1  . aspirin EC 81 MG EC tablet Take 1 tablet (81 mg total) by mouth daily.    [name] atorvastatin (LIPITOR) 20 MG tablet Take 1 tablet (20 mg total) by mouth daily at 6 PM. 30 tablet 0  . baclofen (LIORESAL) 20 MG tablet Take 2 tablets (40 mg total) by  mouth 4 (four) times daily. For spasticity 240 each 5  . calcium citrate (CALCITRATE - DOSED IN MG ELEMENTAL CALCIUM) 950 (200 Ca) MG tablet Take 1 tablet (200 mg of elemental calcium total) by mouth daily. 30 tablet 0  . carbidopa-levodopa (SINEMET IR) 10-100 MG tablet Take 1 tablet by mouth 2 (two) times daily. 60 tablet 0  . cholecalciferol (VITAMIN D) 25 MCG (1000 UNIT) tablet Take 1 tablet (1,000 Units total) by mouth daily. 30 tablet 0  . ciprofloxacin (CIPRO) 500 MG tablet Take 500 mg by mouth 2 (two) times daily.    . colchicine 0.6 MG tablet Take 1 tablet (0.6 mg total) by mouth daily. 30 tablet 0  . docusate sodium (COLACE) 100 MG capsule Take 1 capsule (100 mg total) by mouth 2 (two) times daily. 10 capsule 0  . furosemide (LASIX) 40 MG tablet Take 1 tablet (40 mg total) by mouth daily. 30 tablet 1  . gabapentin (NEURONTIN) 300 MG capsule Take 300 mg by mouth 3 (three) times daily.    [name] guaiFENesin (MUCINEX) 600 MG 12 hr tablet Take 2 tablets (1,200 mg total) by mouth 2 (two) times daily. 120 tablet 1  . HYDROcodone-acetaminophen (NORCO/VICODIN) 5-325 MG tablet Take 1 tablet by mouth every 6 (six) hours as needed for moderate pain or severe pain. (Patient taking differently: Take 1 tablet by mouth in the morning, at noon, and at bedtime. ) 120 tablet 0  . latanoprost (XALATAN) 0.005 % ophthalmic solution Place 1 drop into both eyes at bedtime. 2.5 mL 12  . levothyroxine (SYNTHROID) 125 MCG tablet Take 1 tablet (125 mcg total) by mouth daily before breakfast. 30 tablet 1  . methocarbamol (ROBAXIN) 750 MG tablet  Take 1 tablet (750 mg total) by mouth every 6 (six) hours as needed for muscle spasms. 90 tablet 0  . metoprolol succinate (TOPROL-XL) 25 MG 24 hr tablet Take 1 tablet (25 mg total) by mouth daily. 30 tablet 1  . Multiple Vitamin (MULTIVITAMIN) tablet Take 1 tablet by mouth daily.    . nitroGLYCERIN (NITROSTAT) 0.4 MG SL tablet Place 0.4 mg every 5 (five) minutes as needed under the tongue for chest pain.    . pantoprazole (PROTONIX) 40 MG tablet Take 1 tablet (40 mg total) by mouth every evening. 30 tablet 0  . polyethylene glycol powder (MIRALAX) 17 GM/SCOOP powder Take by mouth as directed. 17 gm every other day     . rizatriptan (MAXALT) 10 MG tablet Take 10 mg by mouth daily as needed.    . senna-docusate (SENOKOT-S) 8.6-50 MG tablet Take 2 tablets by mouth 2 (two) times daily.    [name] spironolactone (ALDACTONE) 25 MG tablet Take 1 tablet (25 mg total) by mouth daily. 30 tablet 1  . tamsulosin (FLOMAX) 0.4 MG CAPS capsule Take 2 capsules (0.8 mg total) by mouth daily after supper. 30 capsule 1  . TRELEGY ELLIPTA 100-62.5-25 MCG/INH AEPB Inhale 1 puff into the lungs daily.   4   No current facility-administered medications for this visit.    Allergies as of 02/05/2020  . (No Known Allergies)     Needle examination of right lower extremity vastus medialis, tibials anterior, gastrocnemius is normal except: 1+ fibrillation potentials in right tibialis anterior at rest and decreased motor unit recruitment on exertion.   IMPRESSION:   Abnormal study demonstrating: 1. Length-dependent, axonal sensorimotor polyneuropathy. 2. Mild evidence of lumbar radiculopathies based upon abnormal tibial F-wave latencies.       INTERPRETING PHYSICIAN:  Penni Bombard, MD STUDY DATE: 12/19/64  Certified in Neurology, Neurophysiology and Neuroimaging  [facility] [address], [place], [place] [address] [phone]   Vitals: BP 113/63   Pulse 66   Ht  '5\' 7"'  (1.702 m)   BMI 32.89 kg/m  Last Weight:  Wt Readings from Last 1 Encounters:  01/23/20 210 lb (95.3 kg)   Last Height:   Ht Readings from Last 1 Encounters:  02/05/20 '5\' 7"'  (1.702 m)   Mental Status: Alert, oriented, thought content appropriate.  Speech fluent without evidence of aphasia. Able to follow 3 step commands without difficulty. Cranial Nerves: II-Discs flat bilaterally. Visual fields grossly intact. III/IV/VI-Extraocular movements intact.  Pupils reactive bilaterally. V/VII-Smile symmetric VIII-grossly intact IX/X-normal gag XI-bilateral shoulder shrug XII-midline tongue extension Motor: no control over hip flexors, he provides traces of adductor activity , not in abductors.when  He is cleaned he has often hip adductor spasms and kick.  Both knees have limited ROM, and the right knee hurt. He has pain in the right heel and calf. He has lost further mus cle mass. Rigor. No involuntary movements seen here- he reports he can kick suddenly without control.  Spasms.    Sensory: Pinprick felt throughout. Loss of vibration in both lower extremities for the knee down. Absent in the right foot completely. In the left some sensation of vibration on the medial ankle.  Deep Tendon Reflexes: absent , not event traces in lower extremities, attenuated in both upper extremities.  Plantars: unresponsive bilaterally Cerebellar: Normal finger-to-nose, normal rapid alternating movements.   There is no family history of neuropathy.  I am very sorry to see that the laminectomy has not helped to re-establsih any lower extremity control.  His upper extremities work fine. His open gluteal sacral- sore - wound is healing.  He remains on robaxin-he remains catheterized. Wheelchair bound. He still as hope to improve over the net 6-8 month further.  I hope he can resume PT at home soon.    Larey Seat, MD   05-20-2018

## 2020-02-05 NOTE — Addendum Note (Signed)
Addended by: Larey Seat on: 02/05/2020 03:55 PM   Modules accepted: Orders

## 2020-02-05 NOTE — Patient Instructions (Signed)
Paraspasticity.

## 2020-02-14 NOTE — Progress Notes (Signed)
DEIVI, HUCKINS ([id number]) Visit Report for 02/04/2020 Arrival Information Details Patient Name: Date of Service: [name] 02/04/2020 9:45 A M Medical Record Number: [id number] Patient Account Number: [id number] Date of Birth/Sex: Treating RN: Jan 14, 1941 (79 y.o. [name] Primary Care Mandolin Falwell: [name] Other Clinician: Referring Demar Shad: Treating Yarah Fuente/Extender: [name] in Treatment: 8 Visit Information History Since Last Visit Added or deleted any medications: No Patient Arrived: Wheel Chair Any new allergies or adverse reactions: No Arrival Time: 10:15 Had a fall or experienced change in No Accompanied By: son-in-law activities of daily living that may affect Transfer Assistance: Harrel Lemon Lift risk of falls: Patient Identification Verified: Yes Signs or symptoms of abuse/neglect since last visito No Secondary Verification Process Completed: Yes Hospitalized since last visit: No Patient Has Alerts: Yes Implantable device outside of the clinic excluding No Patient Alerts: R ABI: 0.89 TBI: 0.64 cellular tissue based products placed in the center L ABI: 0.62 (01/2019) since last visit: Has Dressing in Place as Prescribed: Yes Pain Present Now: No Electronic Signature(s) Signed: 02/05/2020 5:36:57 PM By: [name] RN, BSN Entered By: [name] on 02/04/2020 10:16:08 -------------------------------------------------------------------------------- Encounter Discharge Information Details Patient Name: Date of Service: MA [facility] [place]. 02/04/2020 9:45 A M Medical Record Number: [id number] Patient Account Number: [id number] Date of Birth/Sex: Treating RN: February 16, 1941 (78 y.o. [name] Primary Care Zacharius Funari: [name] Other Clinician: Referring Shanna Un: Treating Rael Tilly/Extender: [name] in Treatment: 8 Encounter Discharge Information Items Discharge Condition:  Stable Ambulatory Status: Wheelchair Discharge Destination: Home Transportation: Private Auto Accompanied By: son in law Schedule Follow-up Appointment: Yes Clinical Summary of Care: Patient Declined Electronic Signature(s) Signed: 02/13/2020 5:50:16 PM By: Carlene Coria RN Entered By: Carlene Coria on 02/04/2020 11:20:31 -------------------------------------------------------------------------------- [place] Details Patient Name: Date of Service: MA [facility], [name] 02/04/2020 9:45 A M Medical Record Number: [id number] Patient Account Number: [id number] Date of Birth/Sex: Treating RN: Nov 18, 1940 (79 y.o. [name] Primary Care Alferd Obryant: [name] Other Clinician: Referring Wilder Kurowski: Treating Dutchess Crosland/Extender: [name] in Treatment: 8 Active Inactive Abuse / Safety / Falls / Self Care Management Nursing Diagnoses: Potential for falls Goals: Patient/caregiver will verbalize/demonstrate measures taken to prevent injury and/or falls Date Initiated: 12/10/2019 Target Resolution Date: 03/03/2020 Goal Status: Active Interventions: Assess fall risk on admission and as needed Assess: immobility, friction, shearing, incontinence upon admission and as needed Notes: Nutrition Nursing Diagnoses: Potential for alteratiion in Nutrition/Potential for imbalanced nutrition Goals: Patient/caregiver will maintain therapeutic glucose control Date Initiated: 12/10/2019 Target Resolution Date: 03/03/2020 Goal Status: Active Interventions: Assess HgA1c results as ordered upon admission and as needed Provide education on elevated blood sugars and impact on wound healing Treatment Activities: Patient referred to Primary Care Physician for further nutritional evaluation : 12/10/2019 Notes: Pressure Nursing Diagnoses: Knowledge deficit related to management of pressures ulcers Potential for impaired tissue integrity related to  pressure, friction, moisture, and shear Goals: Patient/caregiver will verbalize understanding of pressure ulcer management Date Initiated: 12/10/2019 Target Resolution Date: 03/03/2020 Goal Status: Active Interventions: Assess: immobility, friction, shearing, incontinence upon admission and as needed Assess offloading mechanisms upon admission and as needed Assess potential for pressure ulcer upon admission and as needed Notes: Wound/Skin Impairment Nursing Diagnoses: Impaired tissue integrity Knowledge deficit related to ulceration/compromised skin integrity Goals: Patient/caregiver will verbalize understanding of skin care regimen Date Initiated: 12/10/2019 Target Resolution Date: 03/03/2020 Goal Status: Active Ulcer/skin breakdown will have a volume  reduction of 30% by week 4 Date Initiated: 12/10/2019 Date Inactivated: 01/21/2020 Target Resolution Date: 01/14/2020 Goal Status: Met Ulcer/skin breakdown will have a volume reduction of 50% by week 8 Date Initiated: 01/21/2020 Target Resolution Date: 02/18/2020 Goal Status: Active Interventions: Assess patient/caregiver ability to obtain necessary supplies Assess patient/caregiver ability to perform ulcer/skin care regimen upon admission and as needed Assess ulceration(s) every visit Provide education on ulcer and skin care Treatment Activities: Skin care regimen initiated : 12/10/2019 Topical wound management initiated : 12/10/2019 Notes: Electronic Signature(s) Signed: 02/04/2020 5:37:29 PM By: Baruch Gouty RN, BSN Entered By: Baruch Gouty on 02/04/2020 10:28:58 -------------------------------------------------------------------------------- Pain Assessment Details Patient Name: Date of Service: MA [facility] [name] 02/04/2020 9:45 A M Medical Record Number: [id number] Patient Account Number: [id number] Date of Birth/Sex: Treating RN: 06-Feb-1941 (79 y.o. [name] Primary Care Ioanna Colquhoun: [name] Other  Clinician: Referring Samaya Boardley: Treating Apphia Cropley/Extender: [name] in Treatment: 8 Active Problems Location of Pain Severity and Description of Pain Patient Has Paino Yes Site Locations Pain Location: Pain in Ulcers With Dressing Change: Yes Duration of the Pain. Constant / Intermittento Intermittent Rate the pain. Current Pain Level: 6 Character of Pain Describe the Pain: Tender Pain Management and Medication Current Pain Management: Medication: Yes Cold Application: No Rest: No Massage: No Activity: No T.E.N.S.: No Heat Application: No Leg drop or elevation: No Is the Current Pain Management Adequate: Adequate How does your wound impact your activities of daily livingo Sleep: No Bathing: No Appetite: No Relationship With Others: No Bladder Continence: No Emotions: No Bowel Continence: No Work: No Toileting: No Drive: No Dressing: No Hobbies: No Electronic Signature(s) Signed: 02/05/2020 5:36:57 PM By: [name] RN, BSN Entered By: [name] on 02/04/2020 10:17:13 -------------------------------------------------------------------------------- Patient/Caregiver Education Details Patient Name: Date of Service: MA [facility] M. 8/18/2021andnbsp9:45 A M Medical Record Number: [id number] Patient Account Number: [id number] Date of Birth/Gender: Treating RN: 11/24/1940 (79 y.o. [name] Primary Care Physician: [name] Other Clinician: Referring Physician: Treating Physician/Extender: [name] in Treatment: 8 Education Assessment Education Provided To: Patient Education Topics Provided Pressure: Methods: Explain/Verbal Responses: Reinforcements needed, State content correctly Wound/Skin Impairment: Methods: Explain/Verbal Responses: Reinforcements needed, State content correctly Electronic Signature(s) Signed: 02/04/2020 5:37:29 PM By: Baruch Gouty RN, BSN Entered  By: Baruch Gouty on 02/04/2020 10:29:23 -------------------------------------------------------------------------------- Wound Assessment Details Patient Name: Date of Service: MA [facility], [name] 02/04/2020 9:45 A M Medical Record Number: [id number] Patient Account Number: [id number] Date of Birth/Sex: Treating RN: 08/17/1940 (78 y.o. [name] Primary Care Mickael Mcnutt: [name] Other Clinician: Referring Karenna Romanoff: Treating Augusten Lipkin/Extender: [name] in Treatment: 8 Wound Status Wound Number: 1 Primary Pressure Ulcer Etiology: Wound Location: Sacrum Wound Open Wounding Event: Pressure Injury Status: Date Acquired: 09/18/2019 Comorbid Glaucoma, Chronic Obstructive Pulmonary Disease (COPD), Sleep Weeks Of Treatment: 8 History: Apnea, Congestive Heart Failure, Hypertension, Peripheral Arterial Clustered Wound: No Disease, Type II Diabetes, Gout, Osteoarthritis, Paraplegia, Received Radiation Photos Photo Uploaded By: Mikeal Hawthorne on 02/05/2020 16:12:37 Wound Measurements Length: (cm) 5 Width: (cm) 1.8 Depth: (cm) 2.8 Area: (cm) 7.069 Volume: (cm) 19.792 % Reduction in Area: 67.9% % Reduction in Volume: 71.9% Epithelialization: Small (1-33%) Tunneling: No Undermining: Yes Starting Position (o'clock): 1 Ending Position (o'clock): 4 Maximum Distance: (cm) 3.2 Wound Description Classification: Category/Stage IV Wound Margin: Well defined, not attached Exudate Amount: Medium Exudate Type: Serosanguineous Exudate Color: red, brown Foul Odor After Cleansing: No Slough/Fibrino Yes Wound  Bed Granulation Amount: Large (67-100%) Exposed Structure Granulation Quality: Red, Hyper-granulation Fascia Exposed: No Necrotic Amount: Small (1-33%) Fat Layer (Subcutaneous Tissue) Exposed: Yes Necrotic Quality: Adherent Slough Tendon Exposed: No Muscle Exposed: No Joint Exposed: No Bone Exposed: No Treatment Notes Wound #1  (Sacrum) 1. Cleanse With Wound Cleanser 3. Primary Dressing Applied [employer] Other primary dressing (specifiy in notes) 4. Secondary Dressing ABD Pad Dry Gauze 5. Secured With Tape Notes packed with saline moistened gauze Electronic Signature(s) Signed: 02/05/2020 5:36:57 PM By: [name] RN, BSN Entered By: [name] on 02/04/2020 10:22:08 -------------------------------------------------------------------------------- [place] Details Patient Name: Date of Service: MA [facility] [name] 02/04/2020 9:45 A M Medical Record Number: [id number] Patient Account Number: [id number] Date of Birth/Sex: Treating RN: April 13, 1941 (79 y.o. [name] Primary Care Dejanae Helser: [name] Other Clinician: Referring Khaza Blansett: Treating Niccole Witthuhn/Extender: [name] in Treatment: 8 Vital Signs Time Taken: 10:16 Temperature (F): 97.8 Height (in): 67 Pulse (bpm): 67 Weight (lbs): 211 Respiratory Rate (breaths/min): 18 Body Mass Index (BMI): 33 Blood Pressure (mmHg): 107/68 Reference Range: 80 - 120 mg / dl Electronic Signature(s) Signed: 02/05/2020 5:36:57 PM By: [name] RN, BSN Entered By: [name] on 02/04/2020 10:16:44

## 2020-02-18 ENCOUNTER — Telehealth: Payer: Self-pay

## 2020-02-18 ENCOUNTER — Encounter (HOSPITAL_BASED_OUTPATIENT_CLINIC_OR_DEPARTMENT_OTHER): Payer: Medicare Other | Attending: Physician Assistant | Admitting: Physician Assistant

## 2020-02-18 ENCOUNTER — Encounter (HOSPITAL_BASED_OUTPATIENT_CLINIC_OR_DEPARTMENT_OTHER): Payer: Medicare Other | Admitting: Physician Assistant

## 2020-02-18 ENCOUNTER — Other Ambulatory Visit: Payer: Self-pay

## 2020-02-18 DIAGNOSIS — A4189 Other specified sepsis: Secondary | ICD-10-CM | POA: Diagnosis not present

## 2020-02-18 DIAGNOSIS — T83511A Infection and inflammatory reaction due to indwelling urethral catheter, initial encounter: Secondary | ICD-10-CM | POA: Diagnosis not present

## 2020-02-18 NOTE — Progress Notes (Addendum)
[name], [name] ([id number]) Visit Report for 02/18/2020 Chief Complaint Document Details Patient Name: Date of Service: [name] 02/18/2020 8:45 A M Medical Record Number: [id number] Patient Account Number: [id number] Date of Birth/Sex: Treating RN: [birth year] (79 y.o. [name] Primary Care Provider: [name] Other Clinician: Referring Provider: Treating Provider/Extender: [name] in Treatment: 10 Information Obtained from: Patient Chief Complaint Sacral, right achilles, left ankle, and right dorsal foot ulcers Electronic Signature(s) Signed: 02/18/2020 9:26:23 AM By: Worthy Keeler PA-C Entered By: Worthy Keeler on 02/18/2020 78:24:23 -------------------------------------------------------------------------------- HPI Details Patient Name: Date of Service: MA [facility], Cesar HN M. 02/18/2020 8:45 A M Medical Record Number: [id number] Patient Account Number: [id number] Date of Birth/Sex: Treating RN: 1941/01/02 (79 y.o. [name] Primary Care Provider: [name] Other Clinician: Referring Provider: Treating Provider/Extender: [name] in Treatment: 10 History of Present Illness HPI Description: 12/10/2019 upon evaluation today patient appears for initial evaluation here in our clinic unfortunately due to having multiple pressure injuries noted at this point. Including in the sacral region, left ankle, and Achilles region. He also has an ulcer on top of his foot but this appears to be mainly dry I do not even know that there is really a major issue here but nonetheless we are going to continue to monitor this and to the eschar pops off. He still is having some pain he also had apparently some infection around this area that his primary care provider placed him on antibiotic for. The patient does have hypertension and also muscle weakness that came on all of a sudden at the beginning of this year  around March and unfortunately he has not been able to walk since that time prior to that he was walking with some weakness due to spinal stenosis but nothing like what he is experiencing right now. He does spend a lot of his day sitting though he does reposition in his chair I am not sure if that is sufficient or not we will have to see how things progress. Nonetheless the sacral wound is definitely the worst part of everything that he has at this point. He does have diabetes which is very well controlled most recent hemoglobin A1c was 5.4. 12/17/2019 upon evaluation today patient appears to be doing decently well in regard to several of his ulcers on the foot and ankle region. I am very pleased with this. The Achilles area is also doing well. In fact the dorsal surface of the foot is completely healed and this is great news. The sacral region however is another story he tends to sit in his chair most of the day leaning back and I think this is putting pressure on this area even though he is trying to get in a position that is comfortable for him unfortunately I think this is leading to him having more issues with skin breakdown and I think this wound is good to get worse not better if were not careful. I explained that to the patient and his wife today during the evaluation. 7/15; patient I have not seen previously. 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 He also has an area on the left lateral malleolus and the Achilles area. The area on his left lateral malleolus and Achilles are doing well using silver collagen He spent a protracted period of time in the hospital back and forth I think  between the rehab unit largely related to lower back and cervical spine issues. He is no longer ambulatory. During this time he developed a pressure ulcer on his lower sacrum. When he came here this was unstageable it is now more clean wound with clearly exposed bone. It  has a very distinct odor and apparently that is been true on previous visits as well. Last albumin ICU was 3.4. He is religiously turning and offloading this area spending most of his time in bed. He is apparently eating well 01/07/2020 upon evaluation today patient appears to be doing better in regard to his foot and Achilles region. Overall very pleased in that regard. Unfortunately he is not doing quite as well with regard to the sacrum although this is better than when I last saw it and appears to be better as well according to nursing staff as compared to last week's evaluation when I was not here. The wound is deeper but again is cleaned up much more effectively I do believe packing with the gauze as we have been is still the appropriate thing to do. Fortunately there is no signs in general of active infection at this point. 01/21/2020 on evaluation today patient actually appears to be doing quite well in regard to his lower extremity ulcers in fact everything appears to be healed today which is great news there is no signs of active infection at this time. No fevers, chills, nausea, vomiting, or diarrhea. With regard to the sacral wound he is also doing quite well in this regard I feel like he is making excellent progress which is great news. 02/04/2020 on evaluation today patient appears to be doing well with regard to his wound. He has been tolerating dressing changes without complication. Fortunately there is no signs of active infection at this time. No fevers, chills, nausea, vomiting, or diarrhea. With that being said he does have a little bit of a rash under his underarm on the right which they do have a little bit of a concerned about at this time. I will evaluate that today as well. 02/18/2020 upon evaluation today patient appears to be doing poorly in regard to his pain although the wound measurements are actually slightly better in general which is good news. Fortunately there is no signs of  active infection at this time. No fevers, chills, nausea, vomiting, or diarrhea. He does have a lot of new healing at this point as well. With that being said he did get the wound VAC just yesterday as well as the air-fluidized mattress yesterday as well. Fortunately everything seems to be going well with that although the jury is still out as to whether or not the bed is going to be something they can handle it does make in general a lot of the day-to-day task worse even though the pressure offloading is improved. Electronic Signature(s) Signed: 02/18/2020 10:08:48 AM By: Worthy Keeler PA-C Entered By: Worthy Keeler on 02/18/2020 10:08:48 -------------------------------------------------------------------------------- Physical Exam Details Patient Name: Date of Service: MA [facility], Cesar HN M. 02/18/2020 8:45 A M Medical Record Number: [id number] Patient Account Number: [id number] Date of Birth/Sex: Treating RN: [birth year] (79 y.o. [name] Primary Care Provider: [name] Other Clinician: Referring Provider: Treating Provider/Extender: [name] in Treatment: 54 Constitutional Well-nourished and well-hydrated in no acute distress. Respiratory normal breathing without difficulty. Psychiatric this patient is able to make decisions and demonstrates good insight into disease process. Alert and Oriented x 3. pleasant and  cooperative. Notes Upon inspection patient's wounds actually showed signs of good granulation at this time. There was a lot of good epithelization around the edges of the wound which is great news as well. Overall I am pleased in that regard. With that being said he still continues to have significant discomfort but again I think that that is going to be the case for some time until we get more the pressure off my hope is that this new bed will help in that regard. Electronic Signature(s) Signed: 02/18/2020 10:09:49 AM By: Worthy Keeler  PA-C Entered By: Worthy Keeler on 02/18/2020 10:09:49 -------------------------------------------------------------------------------- Physician Orders Details Patient Name: Date of Service: MA [facility] [place], Cesar HN M. 02/18/2020 8:45 A M Medical Record Number: [id number] Patient Account Number: [id number] Date of Birth/Sex: Treating RN: 03-13-1941 (79 y.o. [name] Primary Care Provider: [name] Other Clinician: Referring Provider: Treating Provider/Extender: [name] in Treatment: 10 Verbal / Phone Orders: No Diagnosis Coding ICD-10 Coding Code Description L89.154 Pressure ulcer of sacral region, stage 4 E11.621 Type 2 diabetes mellitus with foot ulcer I10 Essential (primary) hypertension M62.81 Muscle weakness (generalized) B35.4 Tinea corporis Follow-up Appointments ppointment in 2 weeks. - ****[facility] - ROOM 5**** Return A Dressing Change Frequency Wound #1 Sacrum Change dressing three times week. Wound Cleansing Wound #1 Sacrum Clean wound with Wound Cleanser Primary Wound Dressing Wound #1 Sacrum Silver Collagen - line wound bed with silver collagen Other: - saline moistened gauze packing in clinic Negative Presssure Wound Therapy Medela Wound Vac continuously at 134mm/hg - home health to apply NPWT Black Foam Off-Loading Multipodus Splint to: - or bunny boots to both feet especially when in bed A fluidized (Group 3) mattress ir Turn and reposition every 2 hours [facility] for wound care. - [facility] Electronic Signature(s) Signed: 02/18/2020 6:44:49 PM By: Baruch Gouty RN, BSN Signed: 02/21/2020 11:53:24 AM By: Worthy Keeler PA-C Entered By: Baruch Gouty on 02/18/2020 10:06:27 -------------------------------------------------------------------------------- Problem List Details Patient Name: Date of Service: MA [facility], Cesar HN M. 02/18/2020 8:45 A M Medical Record Number:  [id number] Patient Account Number: [id number] Date of Birth/Sex: Treating RN: 17-Dec-1940 (79 y.o. [name] Primary Care Provider: [name] Other Clinician: Referring Provider: Treating Provider/Extender: [name] in Treatment: 10 Active Problems ICD-10 Encounter Code Description Active Date MDM Diagnosis L89.154 Pressure ulcer of sacral region, stage 4 12/10/2019 No Yes E11.621 Type 2 diabetes mellitus with foot ulcer 12/10/2019 No Yes I10 Essential (primary) hypertension 12/10/2019 No Yes M62.81 Muscle weakness (generalized) 12/10/2019 No Yes B35.4 Tinea corporis 02/04/2020 No Yes Inactive Problems Resolved Problems ICD-10 Code Description Active Date Resolved Date L89.520 Pressure ulcer of left ankle, unstageable 12/10/2019 12/10/2019 L89.893 Pressure ulcer of other site, stage 3 12/10/2019 12/10/2019 L97.518 Non-pressure chronic ulcer of other part of right foot with other specified severity 12/10/2019 12/10/2019 Electronic Signature(s) Signed: 02/18/2020 9:26:16 AM By: Worthy Keeler PA-C Entered By: Worthy Keeler on 02/18/2020 09:26:14 -------------------------------------------------------------------------------- Progress Note Details Patient Name: Date of Service: MA [facility], Cesar HN M. 02/18/2020 8:45 A M Medical Record Number: [id number] Patient Account Number: [id number] Date of Birth/Sex: Treating RN: 1941-03-29 (79 y.o. [name] Primary Care Provider: [name] Other Clinician: Referring Provider: Treating Provider/Extender: [name] in Treatment: 10 Subjective Chief Complaint Information obtained from Patient Sacral, right achilles, left ankle, and right dorsal foot ulcers History of Present Illness (HPI) 12/10/2019 upon evaluation  today patient appears for initial evaluation here in our clinic unfortunately due to having multiple pressure injuries noted at this point. Including in  the sacral region, left ankle, and Achilles region. He also has an ulcer on top of his foot but this appears to be mainly dry I do not even know that there is really a major issue here but nonetheless we are going to continue to monitor this and to the eschar pops off. He still is having some pain he also had apparently some infection around this area that his primary care provider placed him on antibiotic for. The patient does have hypertension and also muscle weakness that came on all of a sudden at the beginning of this year around March and unfortunately he has not been able to walk since that time prior to that he was walking with some weakness due to spinal stenosis but nothing like what he is experiencing right now. He does spend a lot of his day sitting though he does reposition in his chair I am not sure if that is sufficient or not we will have to see how things progress. Nonetheless the sacral wound is definitely the worst part of everything that he has at this point. He does have diabetes which is very well controlled most recent hemoglobin A1c was 5.4. 12/17/2019 upon evaluation today patient appears to be doing decently well in regard to several of his ulcers on the foot and ankle region. I am very pleased with this. The Achilles area is also doing well. In fact the dorsal surface of the foot is completely healed and this is great news. The sacral region however is another story he tends to sit in his chair most of the day leaning back and I think this is putting pressure on this area even though he is trying to get in a position that is comfortable for him unfortunately I think this is leading to him having more issues with skin breakdown and I think this wound is good to get worse not better if were not careful. I explained that to the patient and his wife today during the evaluation. 7/15; patient I have not seen previously. He has a fairly substantial stage IV at this point pressure  ulcer on his lower sacrum. He also has an area on the left lateral malleolus and the Achilles area. The area on his left lateral malleolus and Achilles are doing well using silver collagen He spent a protracted period of time in the hospital back and forth I think between the rehab unit largely related to lower back and cervical spine issues. He is no longer ambulatory. During this time he developed a pressure ulcer on his lower sacrum. When he came here this was unstageable it is now more clean wound with clearly exposed bone. It has a very distinct odor and apparently that is been true on previous visits as well. Last albumin ICU was 3.4. He is religiously turning and offloading this area spending most of his time in bed. He is apparently eating well 01/07/2020 upon evaluation today patient appears to be doing better in regard to his foot and Achilles region. Overall very pleased in that regard. Unfortunately he is not doing quite as well with regard to the sacrum although this is better than when I last saw it and appears to be better as well according to nursing staff as compared to last week's evaluation when I was not here. The wound is deeper but again  is cleaned up much more effectively I do believe packing with the gauze as we have been is still the appropriate thing to do. Fortunately there is no signs in general of active infection at this point. 01/21/2020 on evaluation today patient actually appears to be doing quite well in regard to his lower extremity ulcers in fact everything appears to be healed today which is great news there is no signs of active infection at this time. No fevers, chills, nausea, vomiting, or diarrhea. With regard to the sacral wound he is also doing quite well in this regard I feel like he is making excellent progress which is great news. 02/04/2020 on evaluation today patient appears to be doing well with regard to his wound. He has been tolerating dressing changes  without complication. Fortunately there is no signs of active infection at this time. No fevers, chills, nausea, vomiting, or diarrhea. With that being said he does have a little bit of a rash under his underarm on the right which they do have a little bit of a concerned about at this time. I will evaluate that today as well. 02/18/2020 upon evaluation today patient appears to be doing poorly in regard to his pain although the wound measurements are actually slightly better in general which is good news. Fortunately there is no signs of active infection at this time. No fevers, chills, nausea, vomiting, or diarrhea. He does have a lot of new healing at this point as well. With that being said he did get the wound VAC just yesterday as well as the air-fluidized mattress yesterday as well. Fortunately everything seems to be going well with that although the jury is still out as to whether or not the bed is going to be something they can handle it does make in general a lot of the day-to-day task worse even though the pressure offloading is improved. Objective Constitutional Well-nourished and well-hydrated in no acute distress. Vitals Time Taken: 9:37 AM, Height: 67 in, Weight: 211 lbs, BMI: 33, Temperature: 98.5 F, Pulse: 92 bpm, Respiratory Rate: 18 breaths/min, Blood Pressure: 112/73 mmHg. Respiratory normal breathing without difficulty. Psychiatric this patient is able to make decisions and demonstrates good insight into disease process. Alert and Oriented x 3. pleasant and cooperative. General Notes: Upon inspection patient's wounds actually showed signs of good granulation at this time. There was a lot of good epithelization around the edges of the wound which is great news as well. Overall I am pleased in that regard. With that being said he still continues to have significant discomfort but again I think that that is going to be the case for some time until we get more the pressure off my  hope is that this new bed will help in that regard. Integumentary (Hair, Skin) Wound #1 status is Open. Original cause of wound was Pressure Injury. The wound is located on the Sacrum. The wound measures 2.7cm length x 1.2cm width x 1.7cm depth; 2.545cm^2 area and 4.326cm^3 volume. There is Fat Layer (Subcutaneous Tissue) exposed. There is no tunneling or undermining noted. There is a medium amount of serosanguineous drainage noted. The wound margin is well defined and not attached to the wound base. There is large (67-100%) red, hyper - granulation within the wound bed. There is a small (1-33%) amount of necrotic tissue within the wound bed including Adherent Slough. 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 - ****[facility] - ROOM 5**** Dressing Change Frequency: Wound #1 Sacrum: Change dressing three times week. Wound Cleansing: Wound #1 Sacrum: Clean wound with Wound Cleanser Primary Wound Dressing: Wound #1 Sacrum: Silver Collagen - line wound bed with silver collagen Other: - saline moistened gauze packing in clinic Negative Presssure Wound Therapy: Medela Wound Vac continuously at 181mm/hg - home health to apply NPWT Black Foam Off-Loading: Multipodus Splint to: - or bunny boots to both feet especially when in bed Air fluidized (Group 3) mattress Turn and reposition every 2 hours Home Health: [facility] for wound care. - [facility] 1. I would recommend that we go ahead and initiate a continuation of the wound VAC at this point since that is now in force as far as being applied the nurses come out to do that today. 2. I am also can recommend that we see how the air-fluidized mattress goes over the next 2 weeks. Obviously if it something he can  handle I think it would definitely offload more effectively. 3. I am also can recommend at this time that the patient should still be turned and repositioned though obviously the group 3 mattress should also help with this as far as offloading is concerned. We will see patient back for reevaluation in 2 weeks here in the clinic. If anything worsens or changes patient will contact our office for additional recommendations. Electronic Signature(s) Signed: 02/18/2020 10:10:26 AM By: Worthy Keeler PA-C Entered By: Worthy Keeler on 02/18/2020 10:10:26 -------------------------------------------------------------------------------- SuperBill Details Patient Name: Date of Service: MA [facility], Cesar HN M. 02/18/2020 Medical Record Number: [id number] Patient Account Number: [id number] Date of Birth/Sex: Treating RN: [birth year] (79 y.o. [name] Primary Care Provider: [name] Other Clinician: Referring Provider: Treating Provider/Extender: [name] in Treatment: 10 Diagnosis Coding ICD-10 Codes Code Description [phone] Pressure ulcer of sacral region, stage 4 E11.621 Type 2 diabetes mellitus with foot ulcer I10 Essential (primary) hypertension M62.81 Muscle weakness (generalized) B35.4 Tinea corporis Facility Procedures CPT4 Code: [id number] Description: 99213 - WOUND CARE VISIT-LEV 3 EST PT Modifier: Quantity: 1 Physician Procedures : CPT4 Code Description Modifier [id number] - WC PHYS LEVEL 4 - EST PT ICD-10 Diagnosis Description L89.154 Pressure ulcer of sacral region, stage 4 E11.621 Type 2 diabetes mellitus with foot ulcer I10 Essential (primary) hypertension M62.81 Muscle  weakness (generalized) Quantity: 1 Electronic Signature(s) Signed: 02/18/2020 10:10:54 AM By: Worthy Keeler PA-C Entered By: Worthy Keeler on 02/18/2020 10:10:53

## 2020-02-18 NOTE — Progress Notes (Signed)
[name], [name] ([id number]) Visit Report for 02/18/2020 Arrival Information Details Patient Name: Date of Service: [name] [name] 02/18/2020 8:45 A M Medical Record Number: [id number] Patient Account Number: [id number] Date of Birth/Sex: Treating RN: May 15, 1941 (79 y.o. [name] Primary Care [name]: [name] Other Clinician: Referring [name]: Treating [name]: [name] in Treatment: 10 Visit Information History Since Last Visit Added or deleted any medications: No Patient Arrived: Wheel Chair Any new allergies or adverse reactions: No Arrival Time: 09:37 Had a fall or experienced change in No Accompanied By: son in law activities of daily living that may affect Transfer Assistance: [name] risk of falls: Patient Identification Verified: Yes Signs or symptoms of abuse/neglect since last visito No Secondary Verification Process Completed: Yes Hospitalized since last visit: No Patient Has Alerts: Yes Implantable device outside of the [name] excluding No Patient Alerts: R ABI: 0.89 TBI: 0.64 cellular tissue based products placed in the [name] L ABI: 0.62 (01/2019) since last visit: Has Dressing in Place as Prescribed: Yes Pain Present Now: Yes Electronic Signature(s) Signed: 02/18/2020 11:00:36 AM By: Sandre Kitty Entered By: Sandre Kitty on 02/18/2020 09:37:46 -------------------------------------------------------------------------------- [name] Level of Care Assessment Details Patient Name: Date of Service: Cesar [name] M. 02/18/2020 8:45 A M Medical Record Number: [id number] Patient Account Number: [id number] Date of Birth/Sex: Treating RN: 28-Jul-1940 (79 y.o. [name] Primary Care Ambyr Qadri: [name] Other Clinician: Referring Julene Rahn: Treating Gianfranco Araki/Extender: [name] in Treatment: 10 [name] Level of Care Assessment Items TOOL 4 Quantity Score _0  -  0 Use when only an EandM is performed on FOLLOW-UP visit ASSESSMENTS - Nursing Assessment / Reassessment X- 1 10 Reassessment of Co-morbidities (includes updates in patient status) X- 1 5 Reassessment of Adherence to Treatment Plan ASSESSMENTS - Wound and Skin A ssessment / Reassessment X - Simple Wound Assessment / Reassessment - one wound 1 5 _1  - 0 Complex Wound Assessment / Reassessment - multiple wounds _2  - 0 Dermatologic / Skin Assessment (not related to wound area) ASSESSMENTS - Focused Assessment _3  - 0 Circumferential Edema Measurements - multi extremities _4  - 0 Nutritional Assessment / Counseling / Intervention _5  - 0 Lower Extremity Assessment (monofilament, tuning fork, pulses) _6  - 0 Peripheral Arterial Disease Assessment (using hand held doppler) ASSESSMENTS - Ostomy and/or Continence Assessment and Care _7  - 0 Incontinence Assessment and Management _8  - 0 Ostomy Care Assessment and Management (repouching, etc.) PROCESS - Coordination of Care X - Simple Patient / Family Education for ongoing care 1 15 _9  - 0 Complex (extensive) Patient / Family Education for ongoing care X- 1 10 Staff obtains Programmer, systems, Records, T Results / Process Orders est X- 1 10 Staff telephones HHA, Nursing Homes / Clarify orders / etc _10  - 0 Routine Transfer to another Facility (non-emergent condition) _11  - 0 Routine [name] Admission (non-emergent condition) _12  - 0 New Admissions / Biomedical engineer / Ordering NPWT Apligraf, etc. , _13  - 0 Emergency [name] Admission (emergent condition) X- 1 10 Simple Discharge Coordination _14  - 0 Complex (extensive) Discharge Coordination PROCESS - Special Needs _15  - 0 Pediatric / Minor Patient Management _16  - 0 Isolation Patient Management _17  - 0 Hearing / Language / Visual special needs _18  - 0 Assessment of Community assistance (transportation, D/C planning, etc.) _19  - 0 Additional assistance / Altered mentation _20  -  0 Support Surface(s) Assessment (bed, cushion, seat, etc.) INTERVENTIONS - Wound Cleansing / Measurement X - Simple Wound Cleansing -  one wound 1 5 _0  - 0 Complex Wound Cleansing - multiple wounds X- 1 5 Wound Imaging (photographs - any number of wounds) _1  - 0 Wound Tracing (instead of photographs) X- 1 5 Simple Wound Measurement - one wound _2  - 0 Complex Wound Measurement - multiple wounds INTERVENTIONS - Wound Dressings X - Small Wound Dressing one or multiple wounds 1 10 _3  - 0 Medium Wound Dressing one or multiple wounds _4  - 0 Large Wound Dressing one or multiple wounds X- 1 5 Application of Medications - topical <[id number]>_9<\/NATFTDDUKGURKYHC>_6  - 0 Application of Medications - injection INTERVENTIONS - Miscellaneous _6  - 0 External ear exam _7  - 0 Specimen Collection (cultures, biopsies, blood, body fluids, etc.) _8  - 0 Specimen(s) / Culture(s) sent or taken to Lab for analysis X- 1 10 Patient Transfer (multiple staff / Civil Service fast streamer / Similar devices) _9  - 0 Simple Staple / Suture removal (25 or less) _10  - 0 Complex Staple / Suture removal (26 or more) _11  - 0 Hypo / Hyperglycemic Management (close monitor of Blood Glucose) _12  - 0 Ankle / Brachial Index (ABI) - do not check if billed separately X- 1 5 Vital Signs Has the patient been seen at the [name] within the last three years: Yes Total Score: 110 Level Of Care: New/Established - Level 3 Electronic Signature(s) Signed: 02/18/2020 6:44:49 PM By: Baruch Gouty RN, BSN Entered By: Baruch Gouty on 02/18/2020 10:07:40 -------------------------------------------------------------------------------- Encounter Discharge Information Details Patient Name: Date of Service: [name], Cesar HN M. 02/18/2020 8:45 A M Medical Record Number: [id number] Patient Account Number: [id number] Date of Birth/Sex: Treating RN: December 29, 1940 (79 y.o. [name] Primary Care Thursa Emme: [name] Other Clinician: Referring Steve Youngberg: Treating  Kathlen Sakurai/Extender: [name] in Treatment: 10 Encounter Discharge Information Items Discharge Condition: Stable Ambulatory Status: Wheelchair Discharge Destination: Home Transportation: Private Auto Accompanied By: son in law Schedule Follow-up Appointment: Yes Clinical Summary of Care: Patient Declined Electronic Signature(s) Signed: 02/18/2020 6:44:49 PM By: Baruch Gouty RN, BSN Entered By: Baruch Gouty on 02/18/2020 10:20:43 -------------------------------------------------------------------------------- [place] Details Patient Name: Date of Service: [name], Cesar HN M. 02/18/2020 8:45 A M Medical Record Number: [id number] Patient Account Number: [id number] Date of Birth/Sex: Treating RN: [birth year] (79 y.o. [name] Primary Care Adonis Yim: [name] Other Clinician: Referring Lavonn Maxcy: Treating Tom Ragsdale/Extender: [name] in Treatment: 10 Active Inactive Abuse / Safety / Falls / Self Care Management Nursing Diagnoses: Potential for falls Goals: Patient/caregiver will verbalize/demonstrate measures taken to prevent injury and/or falls Date Initiated: 12/10/2019 Target Resolution Date: 03/03/2020 Goal Status: Active Interventions: Assess fall risk on admission and as needed Assess: immobility, friction, shearing, incontinence upon admission and as needed Notes: Nutrition Nursing Diagnoses: Potential for alteratiion in Nutrition/Potential for imbalanced nutrition Goals: Patient/caregiver will maintain therapeutic glucose control Date Initiated: 12/10/2019 Target Resolution Date: 03/03/2020 Goal Status: Active Interventions: Assess HgA1c results as ordered upon admission and as needed Provide education on elevated blood sugars and impact on wound healing Treatment Activities: Patient referred to Primary Care Physician for further nutritional evaluation :  12/10/2019 Notes: Pressure Nursing Diagnoses: Knowledge deficit related to management of pressures ulcers Potential for impaired tissue integrity related to pressure, friction, moisture, and shear Goals: Patient/caregiver will verbalize understanding of pressure ulcer management Date Initiated: 12/10/2019 Target Resolution Date: 03/03/2020 Goal Status: Active Interventions: Assess: immobility, friction, shearing, incontinence upon admission and as needed Assess offloading mechanisms upon admission and as needed Assess potential for pressure ulcer upon admission  and as needed Notes: Wound/Skin Impairment Nursing Diagnoses: Impaired tissue integrity Knowledge deficit related to ulceration/compromised skin integrity Goals: Patient/caregiver will verbalize understanding of skin care regimen Date Initiated: 12/10/2019 Target Resolution Date: 03/03/2020 Goal Status: Active Ulcer/skin breakdown will have a volume reduction of 30% by week 4 Date Initiated: 12/10/2019 Date Inactivated: 01/21/2020 Target Resolution Date: 01/14/2020 Goal Status: Met Ulcer/skin breakdown will have a volume reduction of 50% by week 8 Date Initiated: 01/21/2020 Date Inactivated: 02/18/2020 Target Resolution Date: 02/18/2020 Goal Status: Met Ulcer/skin breakdown will have a volume reduction of 80% by week 12 Date Initiated: 02/18/2020 Target Resolution Date: 03/17/2020 Goal Status: Active Interventions: Assess patient/caregiver ability to obtain necessary supplies Assess patient/caregiver ability to perform ulcer/skin care regimen upon admission and as needed Assess ulceration(s) every visit Provide education on ulcer and skin care Treatment Activities: Skin care regimen initiated : 12/10/2019 Topical wound management initiated : 12/10/2019 Notes: Electronic Signature(s) Signed: 02/18/2020 6:44:49 PM By: Baruch Gouty RN, BSN Entered By: Baruch Gouty on 02/18/2020  09:55:42 -------------------------------------------------------------------------------- Pain Assessment Details Patient Name: Date of Service: [name], Cesar M. 02/18/2020 8:45 A M Medical Record Number: [id number] Patient Account Number: [id number] Date of Birth/Sex: Treating RN: 1941-03-16 (79 y.o. [name] Primary Care Mcclellan Demarais: [name] Other Clinician: Referring Aaryn Sermon: Treating Crawford Tamura/Extender: [name] in Treatment: 10 Active Problems Location of Pain Severity and Description of Pain Patient Has Paino Yes Site Locations Rate the pain. Current Pain Level: 8 Pain Management and Medication Current Pain Management: Electronic Signature(s) Signed: 02/18/2020 11:00:36 AM By: Sandre Kitty Signed: 02/18/2020 6:44:49 PM By: Baruch Gouty RN, BSN Entered By: Sandre Kitty on 02/18/2020 09:38:14 -------------------------------------------------------------------------------- Patient/Caregiver Education Details Patient Name: Date of Service: Cesar [name] M. 9/1/2021andnbsp8:45 A M Medical Record Number: [id number] Patient Account Number: [id number] Date of Birth/Gender: Treating RN: 1940/10/02 (79 y.o. [name] Primary Care Physician: [name] Other Clinician: Referring Physician: Treating Physician/Extender: [name] in Treatment: 10 Education Assessment Education Provided To: Patient Education Topics Provided Pressure: Methods: Explain/Verbal Responses: Reinforcements needed, State content correctly Wound/Skin Impairment: Methods: Explain/Verbal Responses: Reinforcements needed, State content correctly Electronic Signature(s) Signed: 02/18/2020 6:44:49 PM By: Baruch Gouty RN, BSN Entered By: Baruch Gouty on 02/18/2020 10:01:41 -------------------------------------------------------------------------------- Wound Assessment Details Patient Name: Date of  Service: [name], Cesar HN M. 02/18/2020 8:45 A M Medical Record Number: [id number] Patient Account Number: [id number] Date of Birth/Sex: Treating RN: Jul 24, 1940 (79 y.o. [name] Primary Care Jacquis Paxton: [name] Other Clinician: Referring Kylyn Mcdade: Treating Randen Kauth/Extender: [name] in Treatment: 10 Wound Status Wound Number: 1 Primary Pressure Ulcer Etiology: Wound Location: Sacrum Wound Open Wounding Event: Pressure Injury Status: Date Acquired: 09/18/2019 Comorbid Glaucoma, Chronic Obstructive Pulmonary Disease (COPD), Sleep Weeks Of Treatment: 10 History: Apnea, Congestive Heart Failure, Hypertension, Peripheral Arterial Clustered Wound: No Disease, Type II Diabetes, Gout, Osteoarthritis, Paraplegia, Received Radiation Wound Measurements Length: (cm) 2.7 Width: (cm) 1.2 Depth: (cm) 1.7 Area: (cm) 2.545 Volume: (cm) 4.326 % Reduction in Area: 88.4% % Reduction in Volume: 93.9% Epithelialization: Small (1-33%) Tunneling: No Undermining: No Wound Description Classification: Category/Stage IV Wound Margin: Well defined, not attached Exudate Amount: Medium Exudate Type: Serosanguineous Exudate Color: red, brown Foul Odor After Cleansing: No Slough/Fibrino Yes Wound Bed Granulation Amount: Large (67-100%) Exposed Structure Granulation Quality: Red, Hyper-granulation Fascia Exposed: No Necrotic Amount: Small (1-33%) Fat Layer (Subcutaneous Tissue) Exposed: Yes Necrotic Quality: Adherent Slough Tendon Exposed: No Muscle Exposed: No Joint Exposed:  No Bone Exposed: No Treatment Notes Wound #1 (Sacrum) 3. Primary Dressing Applied Other primary dressing (specifiy in notes) 4. Secondary Dressing Foam Border Dressing Notes packed with saline moistened gauze Electronic Signature(s) Signed: 02/18/2020 5:45:10 PM By: Carlene Coria RN Signed: 02/18/2020 6:44:49 PM By: Baruch Gouty RN, BSN Entered By: Carlene Coria on  02/18/2020 09:39:40 -------------------------------------------------------------------------------- [place] Details Patient Name: Date of Service: [name], Cesar HN M. 02/18/2020 8:45 A M Medical Record Number: [id number] Patient Account Number: [id number] Date of Birth/Sex: Treating RN: 1940/11/28 (79 y.o. [name] Primary Care Yaasir Menken: [name] Other Clinician: Referring Perley Arthurs: Treating Adasyn Mcadams/Extender: [name] in Treatment: 10 Vital Signs Time Taken: 09:37 Temperature (F): 98.5 Height (in): 67 Pulse (bpm): 92 Weight (lbs): 211 Respiratory Rate (breaths/min): 18 Body Mass Index (BMI): 33 Blood Pressure (mmHg): 112/73 Reference Range: 80 - 120 mg / dl Electronic Signature(s) Signed: 02/18/2020 11:00:36 AM By: Sandre Kitty Entered By: Sandre Kitty on 02/18/2020 09:38:06

## 2020-02-18 NOTE — Telephone Encounter (Signed)
I talked to Dr. Dagoberto Ligas and she states for nurse to call internal medicine doctor or go to ED or Urgent Care.  Called and informed the [facility].

## 2020-02-18 NOTE — Telephone Encounter (Signed)
Need chest x-ray, need to know what views. Diagnosis SOB, bronchi in lungs. Need to know ASAP.

## 2020-02-19 ENCOUNTER — Emergency Department (HOSPITAL_COMMUNITY): Payer: Medicare Other

## 2020-02-19 ENCOUNTER — Other Ambulatory Visit: Payer: Self-pay

## 2020-02-19 ENCOUNTER — Encounter (HOSPITAL_COMMUNITY): Payer: Self-pay

## 2020-02-19 ENCOUNTER — Inpatient Hospital Stay (HOSPITAL_COMMUNITY)
Admission: EM | Admit: 2020-02-19 | Discharge: 2020-02-24 | DRG: 871 | Disposition: A | Discharge status: Home Health | Payer: Medicare Other | Attending: Internal Medicine | Admitting: Internal Medicine

## 2020-02-19 DIAGNOSIS — Z7951 Long term (current) use of inhaled steroids: Secondary | ICD-10-CM

## 2020-02-19 DIAGNOSIS — Z9989 Dependence on other enabling machines and devices: Secondary | ICD-10-CM

## 2020-02-19 DIAGNOSIS — J1282 Pneumonia due to coronavirus disease 2019: Secondary | ICD-10-CM | POA: Diagnosis present

## 2020-02-19 DIAGNOSIS — K2270 Barrett's esophagus without dysplasia: Secondary | ICD-10-CM | POA: Diagnosis present

## 2020-02-19 DIAGNOSIS — I152 Hypertension secondary to endocrine disorders: Secondary | ICD-10-CM | POA: Diagnosis present

## 2020-02-19 DIAGNOSIS — K219 Gastro-esophageal reflux disease without esophagitis: Secondary | ICD-10-CM | POA: Diagnosis present

## 2020-02-19 DIAGNOSIS — G2581 Restless legs syndrome: Secondary | ICD-10-CM | POA: Diagnosis present

## 2020-02-19 DIAGNOSIS — Z833 Family history of diabetes mellitus: Secondary | ICD-10-CM

## 2020-02-19 DIAGNOSIS — Z87891 Personal history of nicotine dependence: Secondary | ICD-10-CM

## 2020-02-19 DIAGNOSIS — R6520 Severe sepsis without septic shock: Secondary | ICD-10-CM | POA: Diagnosis present

## 2020-02-19 DIAGNOSIS — L89150 Pressure ulcer of sacral region, unstageable: Secondary | ICD-10-CM | POA: Diagnosis present

## 2020-02-19 DIAGNOSIS — Z981 Arthrodesis status: Secondary | ICD-10-CM

## 2020-02-19 DIAGNOSIS — N319 Neuromuscular dysfunction of bladder, unspecified: Secondary | ICD-10-CM | POA: Diagnosis present

## 2020-02-19 DIAGNOSIS — I5032 Chronic diastolic (congestive) heart failure: Secondary | ICD-10-CM | POA: Diagnosis present

## 2020-02-19 DIAGNOSIS — G894 Chronic pain syndrome: Secondary | ICD-10-CM | POA: Diagnosis present

## 2020-02-19 DIAGNOSIS — E785 Hyperlipidemia, unspecified: Secondary | ICD-10-CM | POA: Diagnosis present

## 2020-02-19 DIAGNOSIS — I712 Thoracic aortic aneurysm, without rupture: Secondary | ICD-10-CM | POA: Diagnosis present

## 2020-02-19 DIAGNOSIS — G4730 Sleep apnea, unspecified: Secondary | ICD-10-CM | POA: Diagnosis present

## 2020-02-19 DIAGNOSIS — J440 Chronic obstructive pulmonary disease with acute lower respiratory infection: Secondary | ICD-10-CM | POA: Diagnosis present

## 2020-02-19 DIAGNOSIS — G4733 Obstructive sleep apnea (adult) (pediatric): Secondary | ICD-10-CM | POA: Diagnosis present

## 2020-02-19 DIAGNOSIS — Y846 Urinary catheterization as the cause of abnormal reaction of the patient, or of later complication, without mention of misadventure at the time of the procedure: Secondary | ICD-10-CM | POA: Diagnosis present

## 2020-02-19 DIAGNOSIS — A4189 Other specified sepsis: Secondary | ICD-10-CM | POA: Diagnosis present

## 2020-02-19 DIAGNOSIS — E1151 Type 2 diabetes mellitus with diabetic peripheral angiopathy without gangrene: Secondary | ICD-10-CM | POA: Diagnosis present

## 2020-02-19 DIAGNOSIS — A419 Sepsis, unspecified organism: Secondary | ICD-10-CM | POA: Diagnosis present

## 2020-02-19 DIAGNOSIS — Z7401 Bed confinement status: Secondary | ICD-10-CM

## 2020-02-19 DIAGNOSIS — Z85850 Personal history of malignant neoplasm of thyroid: Secondary | ICD-10-CM

## 2020-02-19 DIAGNOSIS — N390 Urinary tract infection, site not specified: Secondary | ICD-10-CM | POA: Diagnosis present

## 2020-02-19 DIAGNOSIS — Z83438 Family history of other disorder of lipoprotein metabolism and other lipidemia: Secondary | ICD-10-CM

## 2020-02-19 DIAGNOSIS — J150 Pneumonia due to Klebsiella pneumoniae: Secondary | ICD-10-CM | POA: Diagnosis present

## 2020-02-19 DIAGNOSIS — Z66 Do not resuscitate: Secondary | ICD-10-CM | POA: Diagnosis present

## 2020-02-19 DIAGNOSIS — E869 Volume depletion, unspecified: Secondary | ICD-10-CM | POA: Diagnosis present

## 2020-02-19 DIAGNOSIS — B961 Klebsiella pneumoniae [K. pneumoniae] as the cause of diseases classified elsewhere: Secondary | ICD-10-CM | POA: Diagnosis present

## 2020-02-19 DIAGNOSIS — I959 Hypotension, unspecified: Secondary | ICD-10-CM | POA: Diagnosis present

## 2020-02-19 DIAGNOSIS — E1159 Type 2 diabetes mellitus with other circulatory complications: Secondary | ICD-10-CM | POA: Diagnosis present

## 2020-02-19 DIAGNOSIS — E1169 Type 2 diabetes mellitus with other specified complication: Secondary | ICD-10-CM | POA: Diagnosis present

## 2020-02-19 DIAGNOSIS — M4800 Spinal stenosis, site unspecified: Secondary | ICD-10-CM | POA: Diagnosis present

## 2020-02-19 DIAGNOSIS — Z8249 Family history of ischemic heart disease and other diseases of the circulatory system: Secondary | ICD-10-CM

## 2020-02-19 DIAGNOSIS — Z79899 Other long term (current) drug therapy: Secondary | ICD-10-CM

## 2020-02-19 DIAGNOSIS — T83511A Infection and inflammatory reaction due to indwelling urethral catheter, initial encounter: Secondary | ICD-10-CM | POA: Diagnosis present

## 2020-02-19 DIAGNOSIS — Z96652 Presence of left artificial knee joint: Secondary | ICD-10-CM | POA: Diagnosis present

## 2020-02-19 DIAGNOSIS — E039 Hypothyroidism, unspecified: Secondary | ICD-10-CM | POA: Diagnosis present

## 2020-02-19 DIAGNOSIS — E890 Postprocedural hypothyroidism: Secondary | ICD-10-CM | POA: Diagnosis present

## 2020-02-19 DIAGNOSIS — Z7982 Long term (current) use of aspirin: Secondary | ICD-10-CM

## 2020-02-19 DIAGNOSIS — J9621 Acute and chronic respiratory failure with hypoxia: Secondary | ICD-10-CM | POA: Diagnosis present

## 2020-02-19 DIAGNOSIS — L89154 Pressure ulcer of sacral region, stage 4: Secondary | ICD-10-CM | POA: Diagnosis present

## 2020-02-19 DIAGNOSIS — J9601 Acute respiratory failure with hypoxia: Secondary | ICD-10-CM

## 2020-02-19 DIAGNOSIS — J449 Chronic obstructive pulmonary disease, unspecified: Secondary | ICD-10-CM | POA: Diagnosis present

## 2020-02-19 DIAGNOSIS — E118 Type 2 diabetes mellitus with unspecified complications: Secondary | ICD-10-CM | POA: Diagnosis present

## 2020-02-19 DIAGNOSIS — U071 COVID-19: Secondary | ICD-10-CM | POA: Diagnosis present

## 2020-02-19 DIAGNOSIS — Z8711 Personal history of peptic ulcer disease: Secondary | ICD-10-CM

## 2020-02-19 DIAGNOSIS — L89526 Pressure-induced deep tissue damage of left ankle: Secondary | ICD-10-CM | POA: Diagnosis present

## 2020-02-19 DIAGNOSIS — Z978 Presence of other specified devices: Secondary | ICD-10-CM

## 2020-02-19 LAB — URINALYSIS, ROUTINE W REFLEX MICROSCOPIC
Glucose, UA: NEGATIVE mg/dL
Hgb urine dipstick: NEGATIVE
Ketones, ur: NEGATIVE mg/dL
Nitrite: POSITIVE — AB
Protein, ur: 30 mg/dL — AB
Specific Gravity, Urine: 1.026 (ref 1.005–1.030)
WBC, UA: >50 WBC/hpf — ABNORMAL HIGH (ref 0–5)
pH: 5 (ref 5.0–8.0)

## 2020-02-19 LAB — CBC WITH DIFFERENTIAL/PLATELET
Abs Immature Granulocytes: 0 10*3/uL (ref 0.00–0.07)
Basophils Absolute: 0 10*3/uL (ref 0.0–0.1)
Basophils Relative: 0 %
Eosinophils Absolute: 0 10*3/uL (ref 0.0–0.5)
Eosinophils Relative: 0 %
HCT: 40.2 % (ref 39.0–52.0)
Hemoglobin: 13 g/dL (ref 13.0–17.0)
Immature Granulocytes: 0 %
Lymphocytes Relative: 12 %
Lymphs Abs: 0.9 10*3/uL (ref 0.7–4.0)
MCH: 27.4 pg (ref 26.0–34.0)
MCHC: 32.3 g/dL (ref 30.0–36.0)
MCV: 84.8 fL (ref 80.0–100.0)
Monocytes Absolute: 0.9 10*3/uL (ref 0.1–1.0)
Monocytes Relative: 13 %
Neutro Abs: 5.6 10*3/uL (ref 1.7–7.7)
Neutrophils Relative %: 75 %
Platelets: 219 10*3/uL (ref 150–400)
RBC: 4.74 MIL/uL (ref 4.22–5.81)
RDW: 16 % — ABNORMAL HIGH (ref 11.5–15.5)
WBC: 7.7 10*3/uL (ref 4.0–10.5)
nRBC: 0 % (ref 0.0–0.2)
nRBC: 0 /100 WBC

## 2020-02-19 LAB — COMPREHENSIVE METABOLIC PANEL
ALT: 10 U/L (ref 0–44)
AST: 23 U/L (ref 15–41)
Albumin: 3.8 g/dL (ref 3.5–5.0)
Alkaline Phosphatase: 68 U/L (ref 38–126)
Anion gap: 11 (ref 5–15)
BUN: 40 mg/dL — ABNORMAL HIGH (ref 8–23)
CO2: 27 mmol/L (ref 22–32)
Calcium: 8.6 mg/dL — ABNORMAL LOW (ref 8.9–10.3)
Chloride: 99 mmol/L (ref 98–111)
Creatinine, Ser: 0.95 mg/dL (ref 0.61–1.24)
GFR calc Af Amer: >60 mL/min (ref >60)
GFR calc non Af Amer: >60 mL/min (ref >60)
Glucose, Bld: 98 mg/dL (ref 70–99)
Potassium: 3.9 mmol/L (ref 3.5–5.1)
Sodium: 137 mmol/L (ref 135–145)
Total Bilirubin: 0.6 mg/dL (ref 0.3–1.2)
Total Protein: 7.2 g/dL (ref 6.5–8.1)

## 2020-02-19 LAB — SARS CORONAVIRUS 2 BY RT PCR (HOSPITAL ORDER, PERFORMED IN ~~LOC~~ HOSPITAL LAB): SARS Coronavirus 2: POSITIVE — AB

## 2020-02-19 LAB — LACTIC ACID, PLASMA
Lactic Acid, Venous: 1.1 mmol/L (ref 0.5–1.9)
Lactic Acid, Venous: 1.2 mmol/L (ref 0.5–1.9)

## 2020-02-19 LAB — APTT: aPTT: 34 seconds (ref 24–36)

## 2020-02-19 LAB — PROTIME-INR
INR: 1.1 (ref 0.8–1.2)
Prothrombin Time: 13.4 seconds (ref 11.4–15.2)

## 2020-02-19 IMAGING — DX DG CHEST 1V PORT
2 series · 2 of 2 positions shown · non-contrast
Comparison: [DATE]

CLINICAL DATA: Sepsis

EXAM:
PORTABLE CHEST 1 VIEW

[chest ap (1 of 2)]
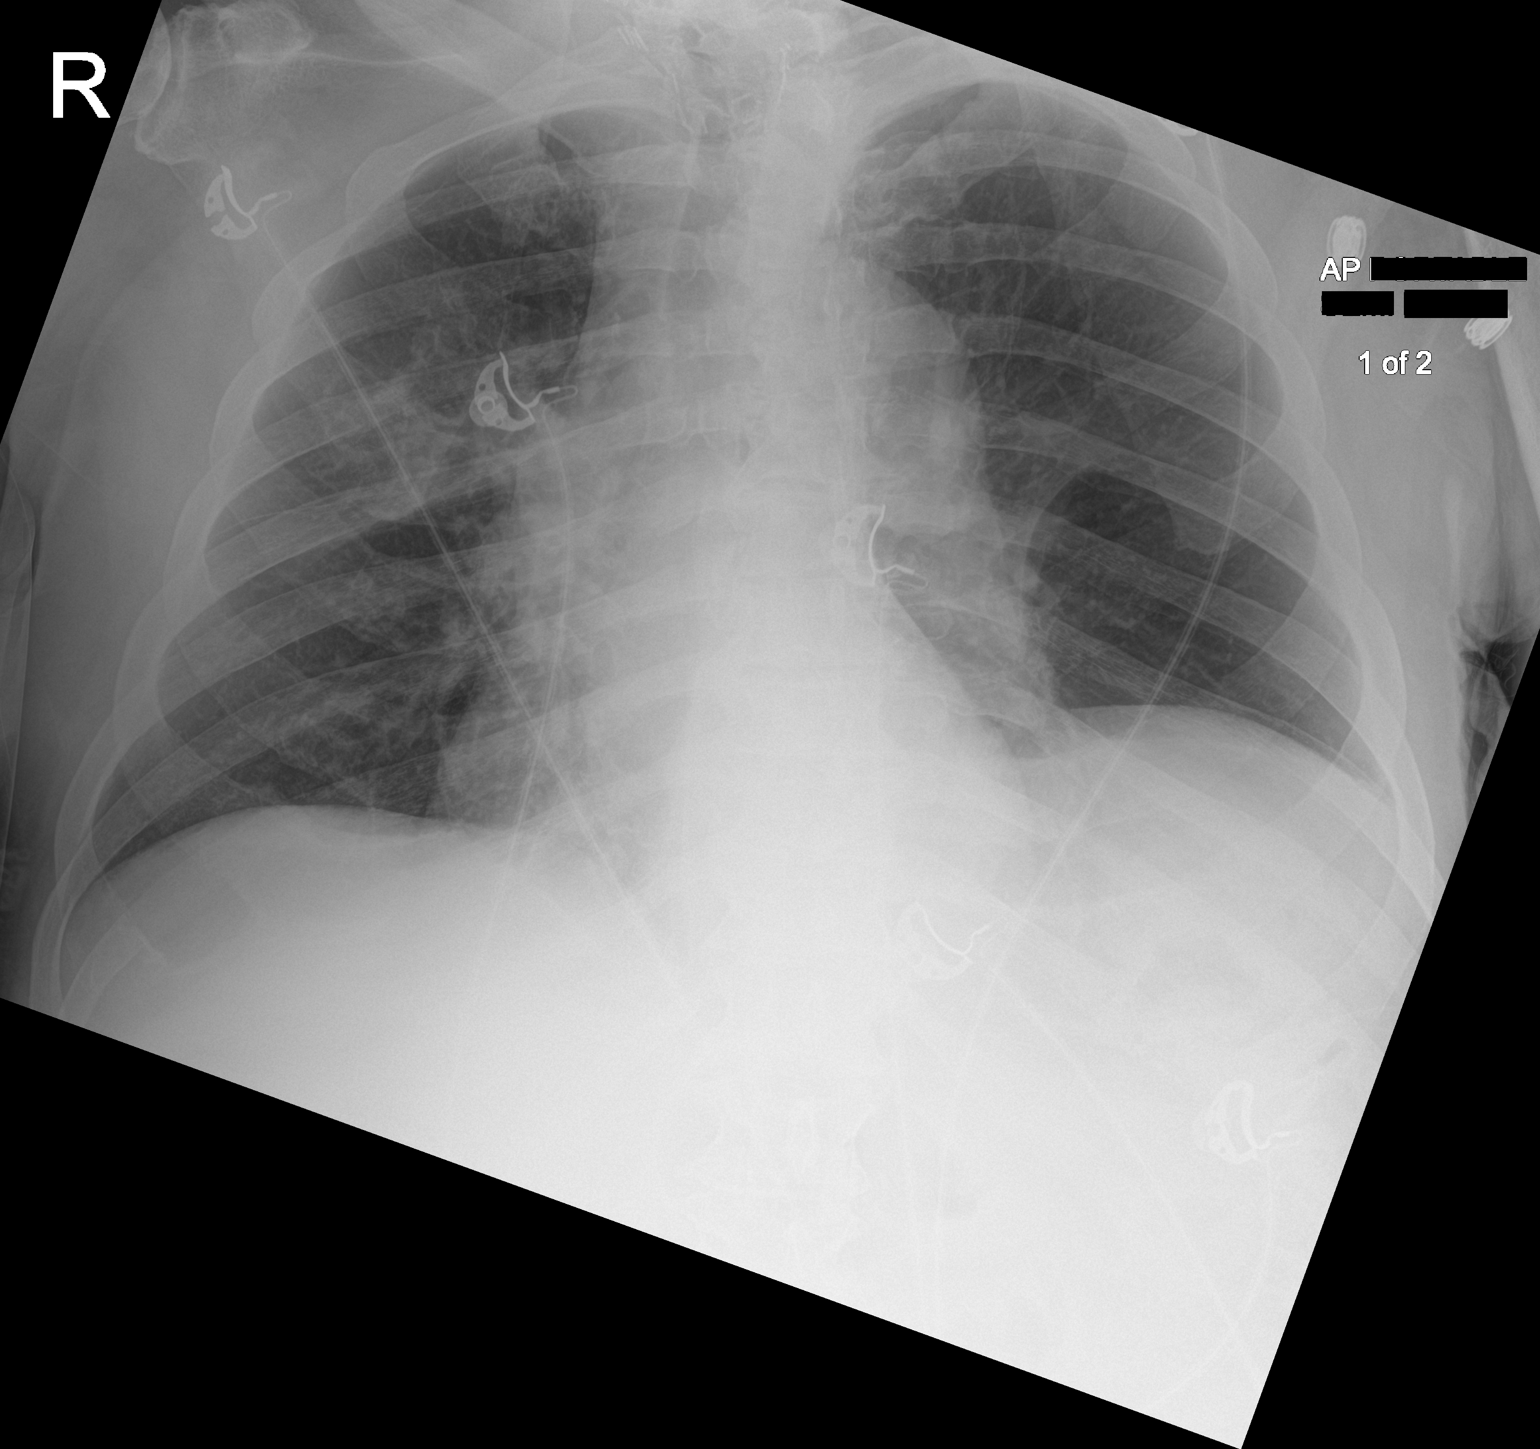

[chest ap (2 of 2)]
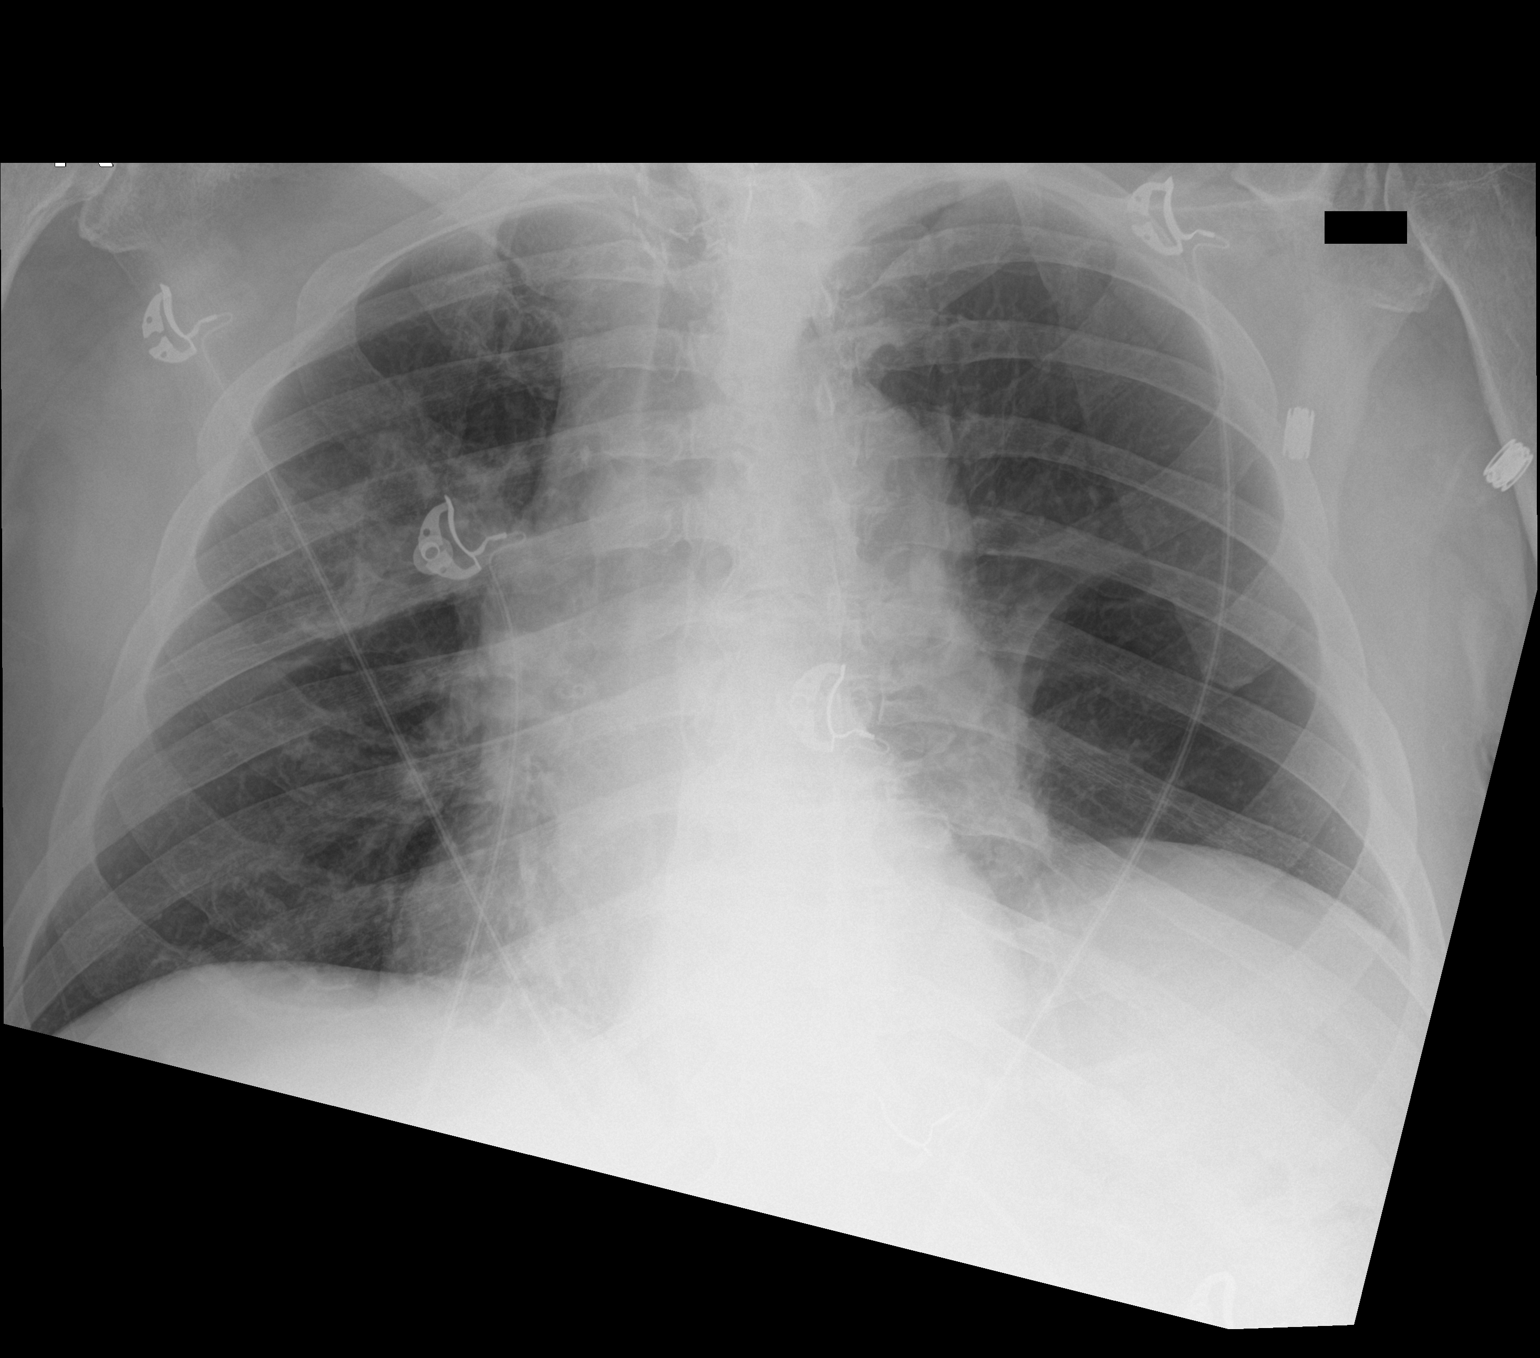

[2 of 2 positions shown; findings below may reference images not displayed]

FINDINGS: Lung volumes are small and pulmonary insufflation has diminished
since prior examination. Focal pulmonary infiltrate has developed
within the right upper lobe, possibly infectious or inflammatory in
nature. No pneumothorax or pleural effusion. Cardiac size within
normal limits. Thoracic aorta is tortuous and ectatic, unchanged.
Pulmonary vascularity is normal. No acute bone abnormality.
IMPRESSION: Focal pulmonary infiltrate has developed within the right upper
lobe, possibly infectious or inflammatory in nature.

## 2020-02-19 MED ORDER — SODIUM CHLORIDE 0.9 % IV SOLN
200.0000 mg | Freq: Once | INTRAVENOUS | Status: AC
  Administered 2020-02-20: 200 mg via INTRAVENOUS
  Filled 2020-02-19: qty 200

## 2020-02-19 MED ORDER — LACTATED RINGERS IV BOLUS (SEPSIS)
1000.0000 mL | Freq: Once | INTRAVENOUS | Status: AC
  Administered 2020-02-19: 1000 mL via INTRAVENOUS

## 2020-02-19 MED ORDER — LACTATED RINGERS IV SOLN: INTRAVENOUS | Status: DC

## 2020-02-19 MED ORDER — DOCUSATE SODIUM 100 MG PO CAPS
100.0000 mg | ORAL_CAPSULE | Freq: Two times a day (BID) | ORAL | Status: DC
  Administered 2020-02-20 – 2020-02-24 (×8): 100 mg via ORAL
  Filled 2020-02-19 (×7): qty 1

## 2020-02-19 MED ORDER — GUAIFENESIN-DM 100-10 MG/5ML PO SYRP
10.0000 mL | ORAL_SOLUTION | ORAL | Status: DC | PRN
  Administered 2020-02-23: 10 mL via ORAL
  Filled 2020-02-19: qty 10

## 2020-02-19 MED ORDER — METRONIDAZOLE IN NACL 5-0.79 MG/ML-% IV SOLN
500.0000 mg | Freq: Once | INTRAVENOUS | Status: AC
  Administered 2020-02-19: 500 mg via INTRAVENOUS
  Filled 2020-02-19: qty 100

## 2020-02-19 MED ORDER — ASCORBIC ACID 500 MG PO TABS
500.0000 mg | ORAL_TABLET | Freq: Every day | ORAL | Status: DC
  Administered 2020-02-20 – 2020-02-24 (×5): 500 mg via ORAL
  Filled 2020-02-19 (×5): qty 1

## 2020-02-19 MED ORDER — ACETAMINOPHEN 325 MG PO TABS
650.0000 mg | ORAL_TABLET | Freq: Four times a day (QID) | ORAL | Status: DC | PRN
  Administered 2020-02-20: 650 mg via ORAL
  Filled 2020-02-19: qty 2

## 2020-02-19 MED ORDER — SODIUM CHLORIDE 0.9 % IV SOLN
100.0000 mg | Freq: Every day | INTRAVENOUS | Status: AC
  Administered 2020-02-21 – 2020-02-24 (×4): 100 mg via INTRAVENOUS
  Filled 2020-02-19 (×4): qty 20

## 2020-02-19 MED ORDER — ATORVASTATIN CALCIUM 20 MG PO TABS
20.0000 mg | ORAL_TABLET | Freq: Every day | ORAL | Status: DC
  Administered 2020-02-20 – 2020-02-23 (×4): 20 mg via ORAL
  Filled 2020-02-19: qty 2
  Filled 2020-02-19 (×3): qty 1

## 2020-02-19 MED ORDER — DEXAMETHASONE SODIUM PHOSPHATE 10 MG/ML IJ SOLN
6.0000 mg | Freq: Every day | INTRAMUSCULAR | Status: DC
  Administered 2020-02-20: 6 mg via INTRAVENOUS
  Filled 2020-02-19: qty 1

## 2020-02-19 MED ORDER — HYDROCODONE-ACETAMINOPHEN 5-325 MG PO TABS
1.0000 | ORAL_TABLET | Freq: Four times a day (QID) | ORAL | Status: DC | PRN
Start: 2020-02-19 — End: 2020-02-19

## 2020-02-19 MED ORDER — GUAIFENESIN ER 600 MG PO TB12
1200.0000 mg | ORAL_TABLET | Freq: Two times a day (BID) | ORAL | Status: DC
  Administered 2020-02-20 – 2020-02-24 (×10): 1200 mg via ORAL
  Filled 2020-02-19 (×10): qty 2

## 2020-02-19 MED ORDER — SENNOSIDES-DOCUSATE SODIUM 8.6-50 MG PO TABS: 1.0000 | ORAL_TABLET | Freq: Every evening | ORAL | Status: DC | PRN

## 2020-02-19 MED ORDER — ZINC SULFATE 220 (50 ZN) MG PO CAPS
220.0000 mg | ORAL_CAPSULE | Freq: Every day | ORAL | Status: DC
  Administered 2020-02-20 – 2020-02-24 (×5): 220 mg via ORAL
  Filled 2020-02-19 (×5): qty 1

## 2020-02-19 MED ORDER — HYDROCOD POLST-CPM POLST ER 10-8 MG/5ML PO SUER: 5.0000 mL | Freq: Two times a day (BID) | ORAL | Status: DC | PRN

## 2020-02-19 MED ORDER — VANCOMYCIN HCL IN DEXTROSE 1-5 GM/200ML-% IV SOLN
1000.0000 mg | Freq: Once | INTRAVENOUS | Status: AC
  Administered 2020-02-19: 1000 mg via INTRAVENOUS
  Filled 2020-02-19: qty 200

## 2020-02-19 MED ORDER — GABAPENTIN 300 MG PO CAPS
300.0000 mg | ORAL_CAPSULE | Freq: Every day | ORAL | Status: DC
  Administered 2020-02-20 – 2020-02-23 (×5): 300 mg via ORAL
  Filled 2020-02-19 (×5): qty 1

## 2020-02-19 MED ORDER — HYDROCODONE-ACETAMINOPHEN 5-325 MG PO TABS
1.0000 | ORAL_TABLET | Freq: Four times a day (QID) | ORAL | Status: DC | PRN
  Administered 2020-02-21 – 2020-02-23 (×4): 1 via ORAL
  Filled 2020-02-19 (×4): qty 1

## 2020-02-19 MED ORDER — LEVOTHYROXINE SODIUM 25 MCG PO TABS
125.0000 ug | ORAL_TABLET | Freq: Every day | ORAL | Status: DC
  Administered 2020-02-20 – 2020-02-24 (×5): 125 ug via ORAL
  Filled 2020-02-19 (×5): qty 1

## 2020-02-19 MED ORDER — ONDANSETRON HCL 4 MG/2ML IJ SOLN: 4.0000 mg | Freq: Four times a day (QID) | INTRAMUSCULAR | Status: DC | PRN

## 2020-02-19 MED ORDER — SODIUM CHLORIDE 0.9% FLUSH
3.0000 mL | Freq: Two times a day (BID) | INTRAVENOUS | Status: DC
  Administered 2020-02-20 – 2020-02-24 (×7): 3 mL via INTRAVENOUS

## 2020-02-19 MED ORDER — FLUTICASONE-UMECLIDIN-VILANT 100-62.5-25 MCG/INH IN AEPB: 1.0000 | INHALATION_SPRAY | Freq: Every day | RESPIRATORY_TRACT | Status: DC

## 2020-02-19 MED ORDER — METHOCARBAMOL 500 MG PO TABS
750.0000 mg | ORAL_TABLET | Freq: Four times a day (QID) | ORAL | Status: DC | PRN
  Administered 2020-02-20 (×2): 750 mg via ORAL
  Filled 2020-02-19 (×2): qty 2

## 2020-02-19 MED ORDER — CARBIDOPA-LEVODOPA 10-100 MG PO TABS
1.0000 | ORAL_TABLET | Freq: Two times a day (BID) | ORAL | Status: DC
  Administered 2020-02-21 – 2020-02-24 (×6): 1 via ORAL
  Filled 2020-02-19 (×12): qty 1

## 2020-02-19 MED ORDER — IPRATROPIUM-ALBUTEROL 20-100 MCG/ACT IN AERS
1.0000 | INHALATION_SPRAY | Freq: Four times a day (QID) | RESPIRATORY_TRACT | Status: DC
  Filled 2020-02-19: qty 4

## 2020-02-19 MED ORDER — BACLOFEN 20 MG PO TABS
40.0000 mg | ORAL_TABLET | Freq: Four times a day (QID) | ORAL | Status: DC
  Administered 2020-02-20 – 2020-02-24 (×18): 40 mg via ORAL
  Filled 2020-02-19: qty 4
  Filled 2020-02-19: qty 2
  Filled 2020-02-19: qty 4
  Filled 2020-02-19: qty 2
  Filled 2020-02-19: qty 4
  Filled 2020-02-19 (×2): qty 2
  Filled 2020-02-19: qty 4
  Filled 2020-02-19 (×3): qty 2
  Filled 2020-02-19: qty 4
  Filled 2020-02-19: qty 2
  Filled 2020-02-19: qty 4
  Filled 2020-02-19: qty 2
  Filled 2020-02-19: qty 4
  Filled 2020-02-19: qty 2
  Filled 2020-02-19: qty 4

## 2020-02-19 MED ORDER — SODIUM CHLORIDE 0.9 % IV SOLN
2.0000 g | Freq: Once | INTRAVENOUS | Status: AC
  Administered 2020-02-19: 2 g via INTRAVENOUS
  Filled 2020-02-19: qty 2

## 2020-02-19 MED ORDER — ONDANSETRON HCL 4 MG PO TABS: 4.0000 mg | ORAL_TABLET | Freq: Four times a day (QID) | ORAL | Status: DC | PRN

## 2020-02-19 MED ORDER — ENOXAPARIN SODIUM 40 MG/0.4ML ~~LOC~~ SOLN
40.0000 mg | SUBCUTANEOUS | Status: DC
  Administered 2020-02-20 – 2020-02-24 (×5): 40 mg via SUBCUTANEOUS
  Filled 2020-02-19 (×5): qty 0.4

## 2020-02-19 MED ORDER — ALBUTEROL SULFATE HFA 108 (90 BASE) MCG/ACT IN AERS: 1.0000 | INHALATION_SPRAY | RESPIRATORY_TRACT | Status: DC | PRN

## 2020-02-19 NOTE — H&P (Signed)
History and Physical    [name] NKN:[id number] DOB: 01-20-1941 DOA: 02/19/2020  PCP: [name], MD  Patient coming from: Home  I have personally briefly reviewed patient's old medical records in [place]  Chief Complaint: Cough, fever, malaise  HPI: [name] is a 79 y.o. male with medical history significant for COPD/chronic respiratory failure with hypoxia on 2 L supplemental O2 via [place] at home, chronic diastolic CHF (last EF 37-90% by TTE 09/18/2019), T2DM, HTN, HLD, hypothyroidism, PVD, thoracic ascending aortic aneurysm, CAS s/p left CEA, neurogenic bladder with chronic indwelling Foley catheter, spinal stenosis with chronic pain and nonambulatory status, sacral wound with wound VAC in place, OSA on CPAP who presents to the ED for evaluation of fevers, dyspnea, diaphoresis, cough, and malaise.  Patient states he is chronically bedbound and nonambulatory.  2 days ago he began to have new shortness of breath and cough productive of white sputum which worsened significantly last night.  He has been having associated fevers, chills, diaphoresis.  He has been feeling dehydrated and says he has not been eating and drinking as well the last 2 days.  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  He has chronic indwelling Foley catheter in place and has not noticed any significant change in urine output or color.  He says he did not have significant improvement in symptoms with home oxygen or inhalers therefore presented to the ED for further evaluation management.  ED Course:  Initial vitals showed BP 85/56, pulse 95, RR 22, temp 100.2 Fahrenheit, SPO2 97% on NRB 10 L O2.  O2 was weaned to 4 L supplemental O2 via [place] with saturations >95%.  Labs notable for WBC 7.7, hemoglobin 13.0, platelets 219,000, sodium 137, potassium 3.9, bicarb 27, BUN 40, creatinine 0.95, LFTs within normal limits, lactic acid 1.1.  Urinalysis showed positive nitrates, large leukocytes,  6-10 RBC/hpf, >50 WBC/hpf, many bacteria microscopy.  Urine and blood cultures were obtained and pending.  SARS-CoV-2 PCR is positive.  Portable chest x-ray shows right upper lobe pulmonary infiltrate.  Patient was given IV vancomycin, cefepime, Flagyl, 3 L LR.  The hospitalist service was consulted to admit for further evaluation and management.  Review of Systems: All systems reviewed and are negative except as documented in history of present illness above.   Past Medical History:  Diagnosis Date  . Arthritis   . Barrett esophagus   . Bronchitis   . Carotid artery occlusion   . COPD (chronic obstructive pulmonary disease) ([place])   . Diabetes mellitus without complication ([place])   . GERD (gastroesophageal reflux disease)   . Headache    migraines  . History of thyroid cancer 2008  . Hypertension   . Restless leg   . Sleep apnea with use of continuous positive airway pressure (CPAP)    2011 [facility] , [facility]  77cn central and obstrcutive. 16 cm water , 3 cm EPR.   . Thoracic ascending aortic aneurysm (H[facility])    4.2 cm ascending TAA 08/18/17 CTA. Annual imaging recommended.  . thyroid ca dx'd 2009 or 2010   surg and radioactive isoptope  . Ulcer    Peptic ulcer disease    Past Surgical History:  Procedure Laterality Date  . ANTERIOR CERVICAL DECOMP/DISCECTOMY FUSION N/A 09/03/2017   Procedure: ACDF - C3-C4 - C4-C5 - C5-C6;  Surgeon: Kary Kos, MD;  Location: [place];  Service: Neurosurgery;  Laterality: N/A;  . CAROTID ENDARTERECTOMY Left January 03, 2006   Dr. Amedeo Plenty  . COLONOSCOPY    . EYE SURGERY Bilateral    cataract surgery with lens implants  . game keepers thumb Right   . JOINT REPLACEMENT Left    knee X 2  . LUMBAR LAMINECTOMY/ DECOMPRESSION WITH MET-RX N/A 11/04/2019   Procedure: LUMBAR THREE-FOUR LUMBAR LAMINECTOMY/ DECOMPRESSION WITH MET-RX;  Surgeon: Judith Part, MD;  Location: [place];  Service: Neurosurgery;  Laterality: N/A;  . RADIOLOGY WITH ANESTHESIA  N/A 10/16/2019   Procedure: MRI THORACIC LUMBAR SPINE;  Surgeon: Radiologist, Medication, MD;  Location: [place];  Service: Radiology;  Laterality: N/A;  . ROTATOR CUFF REPAIR Right   . THYROIDECTOMY  2008    Social History:  reports that he quit smoking about 14 years ago. His smoking use included cigars and cigarettes. He quit after 40.00 years of use. He has never used smokeless tobacco. He reports that he does not drink alcohol and does not use drugs.  No Known Allergies  Family History  Problem Relation Age of Onset  . COPD Mother   . Hyperlipidemia Mother   . Hypertension Mother   . Diabetes Father   . Kidney disease Father        ESRD  . Hypertension Father   . Cancer Father   . Hyperlipidemia Father      Prior to Admission medications   Medication Sig Start Date End Date Taking? Authorizing Provider  acetaminophen (TYLENOL) 325 MG tablet Take 2 tablets (650 mg total) by mouth every 6 (six) hours as needed (pain or temperature). 10/24/19  Yes Angiulli, Lavon Paganini, PA-C  albuterol (PROVENTIL) (2.5 MG/3ML) 0.083% nebulizer solution Inhale 3 mLs (2.5 mg total) into the lungs every 4 (four) hours as needed for wheezing or shortness of breath. 10/24/19  Yes Angiulli, Lavon Paganini, PA-C  albuterol (VENTOLIN HFA) 108 (90 Base) MCG/ACT inhaler Inhale 1 puff into the lungs every 4 (four) hours as needed for wheezing or shortness of breath. 11/20/19  Yes Angiulli, Lavon Paganini, PA-C  aspirin EC 325 MG tablet Take 325 mg by mouth daily.   Yes [provider]  atorvastatin (LIPITOR) 20 MG tablet Take 1 tablet (20 mg total) by mouth daily at 6 PM. 11/20/19  Yes Angiulli, Lavon Paganini, PA-C  baclofen (LIORESAL) 20 MG tablet Take 2 tablets (40 mg total) by mouth 4 (four) times daily. For spasticity 01/12/20  Yes Lovorn, Jinny Blossom, MD  calcium citrate (CALCITRATE - DOSED IN MG ELEMENTAL CALCIUM) 950 (200 Ca) MG tablet Take 1 tablet (200 mg of elemental calcium total) by mouth daily. 11/20/19  Yes Angiulli, Lavon Paganini, PA-C  carbidopa-levodopa (SINEMET IR) 10-100 MG tablet Take 1 tablet by mouth 2 (two) times daily. 11/20/19  Yes Angiulli, Lavon Paganini, PA-C  cholecalciferol (VITAMIN D) 25 MCG (1000 UNIT) tablet Take 1 tablet (1,000 Units total) by mouth daily. 11/20/19  Yes Angiulli, Lavon Paganini, PA-C  colchicine 0.6 MG tablet Take 1 tablet (0.6 mg total) by mouth daily. 11/20/19  Yes Angiulli, Lavon Paganini, PA-C  docusate sodium (COLACE) 100 MG capsule Take 1 capsule (100 mg total) by mouth 2 (two) times daily. 11/10/19  Yes Hosie Poisson, MD  furosemide (LASIX) 40 MG tablet Take 1 tablet (40 mg total) by mouth daily. 11/20/19  Yes Angiulli, Lavon Paganini, PA-C  gabapentin (NEURONTIN) 300 MG capsule Take 300 mg by mouth at bedtime.    Yes [provider]  guaiFENesin (MUCINEX) 600 MG 12 hr tablet Take 2 tablets (1,200 mg  total) by mouth 2 (two) times daily. 11/20/19  Yes Angiulli, Lavon Paganini, PA-C  HYDROcodone-acetaminophen (NORCO/VICODIN) 5-325 MG tablet Take 1 tablet by mouth every 6 (six) hours as needed for moderate pain or severe pain. Patient taking differently: Take 1 tablet by mouth every 6 (six) hours.  01/12/20  Yes Lovorn, Jinny Blossom, MD  latanoprost (XALATAN) 0.005 % ophthalmic solution Place 1 drop into both eyes at bedtime. 10/24/19  Yes Angiulli, Lavon Paganini, PA-C  levothyroxine (SYNTHROID) 125 MCG tablet Take 1 tablet (125 mcg total) by mouth daily before breakfast. 11/20/19  Yes Angiulli, Lavon Paganini, PA-C  methocarbamol (ROBAXIN) 750 MG tablet Take 1 tablet (750 mg total) by mouth every 6 (six) hours as needed for muscle spasms. 11/20/19  Yes Angiulli, Lavon Paganini, PA-C  metoprolol succinate (TOPROL-XL) 25 MG 24 hr tablet Take 1 tablet (25 mg total) by mouth daily. 11/21/19  Yes Angiulli, Lavon Paganini, PA-C  Multiple Vitamin (MULTIVITAMIN) tablet Take 1 tablet by mouth daily.   Yes [provider]  nitroGLYCERIN (NITROSTAT) 0.4 MG SL tablet Place 0.4 mg every 5 (five) minutes as needed under the tongue for chest pain.   Yes  [provider]  pantoprazole (PROTONIX) 40 MG tablet Take 1 tablet (40 mg total) by mouth every evening. 11/20/19  Yes Angiulli, Lavon Paganini, PA-C  polyethylene glycol powder (MIRALAX) 17 GM/SCOOP powder Take 0.5 Containers by mouth daily. 17 gm every other day    Yes [provider]  rizatriptan (MAXALT) 10 MG tablet Take 10 mg by mouth daily as needed for migraine.  12/08/19  Yes [provider]  senna-docusate (SENOKOT-S) 8.6-50 MG tablet Take 2 tablets by mouth 2 (two) times daily. Patient taking differently: Take 2 tablets by mouth at bedtime.  11/10/19  Yes Hosie Poisson, MD  spironolactone (ALDACTONE) 25 MG tablet Take 1 tablet (25 mg total) by mouth daily. Patient taking differently: Take 25 mg by mouth every evening.  11/21/19  Yes Angiulli, Lavon Paganini, PA-C  TRELEGY ELLIPTA 100-62.5-25 MCG/INH AEPB Inhale 1 puff into the lungs daily.  05/01/18  Yes [provider]  aspirin EC 81 MG EC tablet Take 1 tablet (81 mg total) by mouth daily. Patient not taking: Reported on 02/19/2020 10/24/19   Angiulli, Lavon Paganini, PA-C  tamsulosin (FLOMAX) 0.4 MG CAPS capsule Take 2 capsules (0.8 mg total) by mouth daily after supper. Patient not taking: Reported on 02/19/2020 11/20/19   Cathlyn Parsons, PA-C    Physical Exam: Vitals:   02/19/20 2200 02/19/20 2228 02/19/20 2244 02/19/20 2300  BP: 135/80  (!) 123/91 130/82  Pulse: 91  88 96  Resp: '15  15 17  ' Temp:  100.1 F (37.8 C)    TempSrc:  Oral    SpO2: 92%  97% 96%  Weight:      Height:       Constitutional: Chronically ill-appearing obese man resting supine in bed, NAD, calm, appears tired but comfortable Eyes: PERRL, lids and conjunctivae normal ENMT: Mucous membranes are moist. Posterior pharynx clear of any exudate or lesions.  Neck: normal, supple, no masses. Respiratory: Rhonchorous breath sounds anteriorly.  Normal respiratory effort. No accessory muscle use.  Cardiovascular: Regular rate and rhythm, no murmurs /  rubs / gallops. No extremity edema. 2+ pedal pulses. Abdomen: no tenderness, no masses palpated. No hepatosplenomegaly. Bowel sounds positive.  GU: Foley catheter in place with yellow urine in collecting bag. Musculoskeletal: no clubbing / cyanosis. No joint deformity upper and lower extremities. Normal muscle tone.  Skin: Wound VAC in place to sacral pressure ulcer Neurologic: CN 2-12 grossly intact. Sensation intact, no focal deficit. Psychiatric: Normal judgment and insight. Alert and oriented x 3. Normal mood.   Labs on Admission: I have personally reviewed following labs and imaging studies  CBC: Recent Labs  Lab 02/19/20 1949  WBC 7.7  NEUTROABS 5.6  HGB 13.0  HCT 40.2  MCV 84.8  PLT 562   Basic Metabolic Panel: Recent Labs  Lab 02/19/20 1949  NA 137  K 3.9  CL 99  CO2 27  GLUCOSE 98  BUN 40*  CREATININE 0.95  CALCIUM 8.6*   GFR: Estimated Creatinine Clearance: 68.8 mL/min (by C-G formula based on SCr of 0.95 mg/dL). Liver Function Tests: Recent Labs  Lab 02/19/20 1949  AST 23  ALT 10  ALKPHOS 68  BILITOT 0.6  PROT 7.2  ALBUMIN 3.8   No results for input(s): LIPASE, AMYLASE in the last 168 hours. No results for input(s): AMMONIA in the last 168 hours. Coagulation Profile: Recent Labs  Lab 02/19/20 1949  INR 1.1   Cardiac Enzymes: No results for input(s): CKTOTAL, CKMB, CKMBINDEX, TROPONINI in the last 168 hours. BNP (last 3 results) No results for input(s): PROBNP in the last 8760 hours. HbA1C: No results for input(s): HGBA1C in the last 72 hours. CBG: No results for input(s): GLUCAP in the last 168 hours. Lipid Profile: No results for input(s): CHOL, HDL, LDLCALC, TRIG, CHOLHDL, LDLDIRECT in the last 72 hours. Thyroid Function Tests: No results for input(s): TSH, T4TOTAL, FREET4, T3FREE, THYROIDAB in the last 72 hours. Anemia Panel: No results for input(s): VITAMINB12, FOLATE, FERRITIN, TIBC, IRON, RETICCTPCT in the last 72 hours. Urine  analysis:    Component Value Date/Time   COLORURINE YELLOW 02/19/2020 1943   APPEARANCEUR CLOUDY (A) 02/19/2020 1943   LABSPEC 1.026 02/19/2020 1943   PHURINE 5.0 02/19/2020 1943   GLUCOSEU NEGATIVE 02/19/2020 1943   HGBUR NEGATIVE 02/19/2020 1943   BILIRUBINUR SMALL (A) 02/19/2020 1943   KETONESUR NEGATIVE 02/19/2020 1943   PROTEINUR 30 (A) 02/19/2020 1943   UROBILINOGEN 1.0 09/09/2010 1045   NITRITE POSITIVE (A) 02/19/2020 1943   LEUKOCYTESUR LARGE (A) 02/19/2020 1943    Radiological Exams on Admission: DG Chest Port 1 View  Result Date: 02/19/2020 CLINICAL DATA:  Sepsis EXAM: PORTABLE CHEST 1 VIEW COMPARISON:  11/10/2019 FINDINGS: Lung volumes are small and pulmonary insufflation has diminished since prior examination. Focal pulmonary infiltrate has developed within the right upper lobe, possibly infectious or inflammatory in nature. No pneumothorax or pleural effusion. Cardiac size within normal limits. Thoracic aorta is tortuous and ectatic, unchanged. Pulmonary vascularity is normal. No acute bone abnormality. IMPRESSION: Focal pulmonary infiltrate has developed within the right upper lobe, possibly infectious or inflammatory in nature. Electronically Signed   By: Fidela Salisbury MD   On: 02/19/2020 20:50    EKG: Independently reviewed. Sinus rhythm, RBBB and LAFB.  No significant change compared to previous.  Assessment/Plan Principal Problem:   Acute hypoxemic respiratory failure due to COVID-19 [facility]) Active Problems:   OSA on CPAP   Hypertension associated with diabetes ([place])   Chronic diastolic CHF (congestive heart failure) ([place])   Diabetes mellitus type 2, controlled, with complications ([place])   Neurogenic bladder   Hypothyroidism   Hyperlipidemia associated with type 2 diabetes mellitus ([employer])   Chronic indwelling Foley catheter  [name] is a 79 y.o. male with medical history significant for COPD/chronic respiratory failure with hypoxia on 2 L supplemental O2 via  [place] at home, chronic diastolic CHF (last EF 74-93% by TTE 09/18/2019), T2DM, HTN, HLD, hypothyroidism, PVD, thoracic ascending aortic aneurysm, CAS s/p left CEA, neurogenic bladder with chronic indwelling Foley catheter, spinal stenosis with chronic pain and nonambulatory status, sacral wound with wound VAC in place, OSA on CPAP who is admitted with severe sepsis and acute on chronic hypoxemic respiratory failure due to COVID-19 pneumonia.  Severe sepsis and acute on chronic hypoxemic respiratory failure due to COVID-19 pneumonia: SARS-CoV-2 + 02/19/2020.  CXR with right upper lobe infiltrate.  Has increased O2 requirement from home 2 L to 4 L O2 via [place].  Met SIRS criteria with tachypnea and RR 22, tachycardia to 96.  Was hypotensive with SBP 85 which improved after aggressive IV fluid resuscitation in the ED. -Start IV remdesivir per pharmacy -Start IV Decadron 6 mg daily -Continue supplemental oxygen and wean to home 2 L O2 via [place] as able -Hold further antibiotics, follow blood and urine cultures -Continue Combivent, as needed albuterol, home trelegy -Flutter valve, incentive spirometer, antitussives  COPD: Without acute exacerbation.  Continue management as above.  Chronic diastolic CHF: Volume depleted on arrival requiring aggressive fluid resuscitation in the ED.  Hold further fluids.  Monitor strict I/O's and resume home Lasix as able.  Hypertension: Hold home antihypertensives due to hypotension on arrival.  Resume as able.  Diet controlled type 2 diabetes: Continue to monitor.  Add correction insulin if needed.  Neurogenic bladder with chronic indwelling Foley catheter: Urinalysis abnormal in setting of chronic indwelling Foley catheter, possibly colonized without active UTI.  Hold further antibiotics, follow urine culture.  Continue Foley care.  Sacral pressure wound: Wound VAC in place.  Follows with wound center.  Hypothyroidism: Continue Synthroid.  Hyperlipidemia: Continue  atorvastatin.  Chronic pain syndrome/spinal stenosis/spasticity/RLS: Continue home Norco as needed with hold parameters, Robaxin, gabapentin, baclofen, Sinemet.  OSA: Continue CPAP nightly.  DVT prophylaxis: Lovenox Code Status: DNR, confirmed with patient Family Communication: Discussed with patient, he has discussed with family Disposition Plan: From home and likely discharge to home pending symptomatic improvement, ability to wean to home O2 and transition to outpatient therapy. Consults called: None Admission status:  Status is: Inpatient  Remains inpatient appropriate because:IV treatments appropriate due to intensity of illness or inability to take PO and Inpatient level of care appropriate due to severity of illness   Dispo: The patient is from: Home              Anticipated d/c is to: Home              Anticipated d/c date is: 3 days              Patient currently is not medically stable to d/c.    Zada Finders MD [facility]  If 7PM-7AM, please contact night-coverage [email or web address]  02/19/2020, 11:09 PM

## 2020-02-19 NOTE — Progress Notes (Signed)
A consult was received from an ED physician for vancomycin and cefepime per pharmacy dosing (for an indication other than meningitis). The patient's profile has been reviewed for ht/wt/allergies/indication/available labs. A one time order has been placed for the above antibiotics.  Further antibiotics/pharmacy consults should be ordered by admitting physician if indicated.                       Reuel Boom, PharmD, BCPS [phone] 02/19/2020, 7:46 PM

## 2020-02-19 NOTE — ED Provider Notes (Signed)
[facility] DEPT Provider Note   CSN: [id number] Arrival date & time: 02/19/20  1832     History Chief Complaint  Patient presents with  . Shortness of Breath    [name] is a 79 y.o. male.  HPI 79 yo male with copd, dm, hypertension, ulcer, thoracic aneurysm presents today with cough, fever, generalized malaise.  Patient reports cough productive of sputum. Fever to 101 and received tylenol at 345.  Discussed DNR orders with him.  He does not want intubation or chest compressions but states that she discussed the remainder of it with his wife.  Patient had Covid vaccine in the spring.  He does not know of any recent Covid Dr. [name] Covid phizer February     Past Medical History:  Diagnosis Date  . Arthritis   . Barrett esophagus   . Bronchitis   . Carotid artery occlusion   . COPD (chronic obstructive pulmonary disease) ([place])   . Diabetes mellitus without complication ([place])   . GERD (gastroesophageal reflux disease)   . Headache    migraines  . History of thyroid cancer 2008  . Hypertension   . Restless leg   . Sleep apnea with use of continuous positive airway pressure (CPAP)    2011 [facility] , AHI  77cn central and obstrcutive. 16 cm water , 3 cm EPR.   . Thoracic ascending aortic aneurysm (HCC)    4.2 cm ascending TAA 08/18/17 CTA. Annual imaging recommended.  . thyroid ca dx'd 2009 or 2010   surg and radioactive isoptope  . Ulcer    Peptic ulcer disease    Patient Active Problem List   Diagnosis Date Noted  . Educated about COVID-19 virus infection 01/22/2020  . Stage 4 skin ulcer of sacral region ([place]) 01/12/2020  . Hypothyroidism   . Palliative care by specialist   . DNR (do not resuscitate)   . Acute on chronic respiratory failure with hypoxia ([place]) 10/29/2019  . Paraplegia ([place]) 10/28/2019  . Labile blood pressure   . Spasticity   . Muscle spasm   . Hypoxia   . Supplemental oxygen dependent   . Neurogenic  bladder   . Chronic diastolic congestive heart failure ([place])   . Neurogenic bowel   . Hypocalcemia   . Spinal stenosis of lumbar region   . 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 Diabetes mellitus type 2, controlled, with complications ([place]) 66/44/0347  . Obese 09/17/2019  . Thoracic aortic aneurysm ([place]) 09/17/2019  . Bowel incontinence 09/17/2019  . Bladder incontinence 09/17/2019  . Right hemiparesis ([place]) 09/17/2019  . Acute renal failure (ARF) ([place]) 09/17/2019  . Left hemiparesis ([place]) 09/17/2019  . Chronic respiratory failure with hypoxia ([place]) 09/17/2019  . Syncope and collapse 09/17/2019  . Chronic diastolic CHF (congestive heart failure) ([place]) 09/04/2019  . PVD (peripheral vascular disease) ([place]) 02/14/2019  . Essential hypertension 02/13/2019  . Non-insulin treated type 2 diabetes mellitus ([place]) 02/13/2019  . Severe sepsis ([place]) 01/17/2019  . Community acquired pneumonia of right lower lobe of lung 01/17/2019  . Postural urinary incontinence 05/20/2018  . Myelopathy concurrent with and due to spinal stenosis of cervical region ([place]) 05/20/2018  . Myoclonic jerking 05/20/2018  . 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 History of respiratory failure 08/18/2017  . Hypokalemia 08/18/2017  . Leukocytosis 08/18/2017  . Muscle atrophy of lower extremity 08/09/2017  . Bowel and bladder incontinence 08/09/2017  . Right-sided muscle weakness 08/09/2017  .  Neuropathy 08/09/2017  . 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/2017  . Abnormality of gait 07/12/2017  . Myoclonic jerkings, 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/2017  . Acquired right foot drop 07/12/2017  . UTI (urinary tract infection) due to Enterococcus 07/12/2017  . Pressure ulcer 06/21/2017  . Acute cystitis 06/20/2017  . Weakness generalized 06/20/2017  . Dehydration 06/20/2017  . Dyspnea 12/26/2016  . Chronic cough 12/26/2016  . Hypersomnia with sleep apnea 10/30/2016  . CSA  (central sleep apnea) 10/30/2016  . Wheezing symptom 10/30/2016  . 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 RLS (restless legs syndrome) 08/25/2014  . Primary gout 08/25/2014  . OSA on CPAP 08/25/2014  . Severe obesity (BMI >= 40) ([place]) 08/25/2014  . Obesity (BMI 30-39.9) 02/18/2013  . 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 S/P TKR (total knee replacement) 11/29/2012  . History of thyroid cancer     Past Surgical History:  Procedure Laterality Date  . ANTERIOR CERVICAL DECOMP/DISCECTOMY FUSION N/A 09/03/2017   Procedure: ACDF - C3-C4 - C4-C5 - C5-C6;  Surgeon: Kary Kos, MD;  Location: [facility];  Service: Neurosurgery;  Laterality: N/A;  . CAROTID ENDARTERECTOMY Left January 03, 2006   Dr. Amedeo Plenty  . COLONOSCOPY    . EYE SURGERY Bilateral    cataract surgery with lens implants  . game keepers thumb Right   . JOINT REPLACEMENT Left    knee X 2  . LUMBAR LAMINECTOMY/ DECOMPRESSION WITH MET-RX N/A 11/04/2019   Procedure: LUMBAR THREE-FOUR LUMBAR LAMINECTOMY/ DECOMPRESSION WITH MET-RX;  Surgeon: Judith Part, MD;  Location: [facility];  Service: Neurosurgery;  Laterality: N/A;  . RADIOLOGY WITH ANESTHESIA N/A 10/16/2019   Procedure: MRI THORACIC LUMBAR SPINE;  Surgeon: Radiologist, Medication, MD;  Location: [facility];  Service: Radiology;  Laterality: N/A;  . ROTATOR CUFF REPAIR Right   . THYROIDECTOMY  2008       Family History  Problem Relation Age of Onset  . COPD Mother   . Hyperlipidemia Mother   . Hypertension Mother   . Diabetes Father   . Kidney disease Father        ESRD  . Hypertension Father   . Cancer Father   . Hyperlipidemia Father     Social History   Tobacco Use  . Smoking status: Former Smoker    Years: 40.00    Types: Cigars, Cigarettes    Quit date: 06/19/2005    Years since quitting: 14.6  . Smokeless tobacco: Never Used  . Tobacco comment: smoked 4-6 cigars daily, only smoked cigarettes socially   Vaping Use  . Vaping Use: Never used  Substance Use Topics  . Alcohol use: No  . Drug use: No    Home Medications Prior to Admission medications   Medication Sig Start Date End Date Taking? Authorizing Provider  acetaminophen (TYLENOL) 325 MG tablet Take 2 tablets (650 mg total) by mouth every 6 (six) hours as needed (pain or temperature). 10/24/19   Angiulli, Lavon Paganini, PA-C  albuterol (PROVENTIL) (2.5 MG/3ML) 0.083% nebulizer solution Inhale 3 mLs (2.5 mg total) into the lungs every 4 (four) hours as needed for wheezing or shortness of breath. 10/24/19   Angiulli, Lavon Paganini, PA-C  albuterol (VENTOLIN HFA) 108 (90 Base) MCG/ACT inhaler Inhale 1 puff into the lungs every 4 (four) hours as needed for wheezing or shortness of breath. 11/20/19   Angiulli, Lavon Paganini, PA-C  aspirin EC 81 MG EC tablet Take 1 tablet (81  mg total) by mouth daily. 10/24/19   Angiulli, Lavon Paganini, PA-C  atorvastatin (LIPITOR) 20 MG tablet Take 1 tablet (20 mg total) by mouth daily at 6 PM. 11/20/19   Angiulli, Lavon Paganini, PA-C  baclofen (LIORESAL) 20 MG tablet Take 2 tablets (40 mg total) by mouth 4 (four) times daily. For spasticity 01/12/20   Lovorn, Jinny Blossom, MD  calcium citrate (CALCITRATE - DOSED IN MG ELEMENTAL CALCIUM) 950 (200 Ca) MG tablet Take 1 tablet (200 mg of elemental calcium total) by mouth daily. 11/20/19   Angiulli, Lavon Paganini, PA-C  carbidopa-levodopa (SINEMET IR) 10-100 MG tablet Take 1 tablet by mouth 2 (two) times daily. 11/20/19   Angiulli, Lavon Paganini, PA-C  cholecalciferol (VITAMIN D) 25 MCG (1000 UNIT) tablet Take 1 tablet (1,000 Units total) by mouth daily. 11/20/19   Angiulli, Lavon Paganini, PA-C  ciprofloxacin (CIPRO) 500 MG tablet Take 500 mg by mouth 2 (two) times daily. 11/27/19   [provider]  colchicine 0.6 MG tablet Take 1 tablet (0.6 mg total) by mouth daily. 11/20/19   Angiulli, Lavon Paganini, PA-C  docusate sodium (COLACE) 100 MG capsule Take 1 capsule (100 mg total) by mouth 2 (two) times daily. 11/10/19   Hosie Poisson, MD  furosemide (LASIX) 40 MG tablet Take 1 tablet (40 mg total) by mouth daily. 11/20/19   Angiulli, Lavon Paganini, PA-C  gabapentin (NEURONTIN) 300 MG capsule Take 300 mg by mouth 3 (three) times daily.    [provider]  guaiFENesin (MUCINEX) 600 MG 12 hr tablet Take 2 tablets (1,200 mg total) by mouth 2 (two) times daily. 11/20/19   Angiulli, Lavon Paganini, PA-C  HYDROcodone-acetaminophen (NORCO/VICODIN) 5-325 MG tablet Take 1 tablet by mouth every 6 (six) hours as needed for moderate pain or severe pain. Patient taking differently: Take 1 tablet by mouth in the morning, at noon, and at bedtime.  01/12/20   Lovorn, Jinny Blossom, MD  latanoprost (XALATAN) 0.005 % ophthalmic solution Place 1 drop into both eyes at bedtime. 10/24/19   Angiulli, Lavon Paganini, PA-C  levothyroxine (SYNTHROID) 125 MCG tablet Take 1 tablet (125 mcg total) by mouth daily before breakfast. 11/20/19   Angiulli, Lavon Paganini, PA-C  methocarbamol (ROBAXIN) 750 MG tablet Take 1 tablet (750 mg total) by mouth every 6 (six) hours as needed for muscle spasms. 11/20/19   Angiulli, Lavon Paganini, PA-C  metoprolol succinate (TOPROL-XL) 25 MG 24 hr tablet Take 1 tablet (25 mg total) by mouth daily. 11/21/19   Angiulli, Lavon Paganini, PA-C  Multiple Vitamin (MULTIVITAMIN) tablet Take 1 tablet by mouth daily.    [provider]  nitroGLYCERIN (NITROSTAT) 0.4 MG SL tablet Place 0.4 mg every 5 (five) minutes as needed under the tongue for chest pain.    [provider]  pantoprazole (PROTONIX) 40 MG tablet Take 1 tablet (40 mg total) by mouth every evening. 11/20/19   Angiulli, Lavon Paganini, PA-C  polyethylene glycol powder (MIRALAX) 17 GM/SCOOP powder Take by mouth as directed. 17 gm every other day     [provider]  rizatriptan (MAXALT) 10 MG tablet Take 10 mg by mouth daily as needed. 12/08/19   [provider]  senna-docusate (SENOKOT-S) 8.6-50 MG tablet Take 2 tablets by mouth 2 (two) times daily. 11/10/19   Hosie Poisson, MD   spironolactone (ALDACTONE) 25 MG tablet Take 1 tablet (25 mg total) by mouth daily. 11/21/19   Angiulli, Lavon Paganini, PA-C  tamsulosin (FLOMAX) 0.4 MG CAPS capsule Take 2 capsules (0.8 mg total)  by mouth daily after supper. 11/20/19   Angiulli, Lavon Paganini, PA-C  TRELEGY ELLIPTA 100-62.5-25 MCG/INH AEPB Inhale 1 puff into the lungs daily.  05/01/18   [provider]    Allergies    Patient has no known allergies.  Review of Systems   Review of Systems  All other systems reviewed and are negative.   Physical Exam Updated Vital Signs BP (!) 85/56 (BP Location: Right Arm)   Pulse 96   Temp 100.2 F (37.9 C) (Oral)   Resp (!) 22   Ht 1.702 m ('5\' 7"' )   Wt 90.7 kg   SpO2 99%   BMI 31.32 kg/m   Physical Exam Vitals and nursing note reviewed.  Constitutional:      General: He is not in acute distress.    Appearance: He is well-developed. He is obese. He is ill-appearing.  HENT:     Head: Normocephalic.  Eyes:     Pupils: Pupils are equal, round, and reactive to light.  Pulmonary:     Effort: Pulmonary effort is normal.     Breath sounds: Examination of the right-lower field reveals decreased breath sounds. Examination of the left-lower field reveals decreased breath sounds. Decreased breath sounds present.  Chest:     Chest wall: No mass.  Abdominal:     General: Bowel sounds are normal.     Palpations: Abdomen is soft.  Musculoskeletal:        General: Normal range of motion.     Cervical back: Normal range of motion.     Right lower leg: No tenderness. No edema.     Left lower leg: No tenderness. No edema.  Skin:    General: Skin is warm and dry.     Capillary Refill: Capillary refill takes less than 2 seconds.  Neurological:     General: No focal deficit present.     Mental Status: He is alert.  Psychiatric:        Mood and Affect: Mood normal.     ED Results / Procedures / Treatments   Labs (all labs ordered are listed, but only abnormal results are  displayed) Labs Reviewed - No data to display  EKG EKG Interpretation  Date/Time:  Thursday February 19 2020 19:49:58 EDT Ventricular Rate:  90 PR Interval:    QRS Duration: 130 QT Interval:  394 QTC Calculation: 483 R Axis:   -80 Text Interpretation: Sinus rhythm Borderline short PR interval RBBB and LAFB Confirmed by Pattricia Boss [phone]) on 02/19/2020 10:02:43 PM   Radiology DG Chest Port 1 View  Result Date: 02/19/2020 CLINICAL DATA:  Sepsis EXAM: PORTABLE CHEST 1 VIEW COMPARISON:  11/10/2019 FINDINGS: Lung volumes are small and pulmonary insufflation has diminished since prior examination. Focal pulmonary infiltrate has developed within the right upper lobe, possibly infectious or inflammatory in nature. No pneumothorax or pleural effusion. Cardiac size within normal limits. Thoracic aorta is tortuous and ectatic, unchanged. Pulmonary vascularity is normal. No acute bone abnormality. IMPRESSION: Focal pulmonary infiltrate has developed within the right upper lobe, possibly infectious or inflammatory in nature. Electronically Signed   By: Fidela Salisbury MD   On: 02/19/2020 20:50    Procedures .Critical Care Performed by: Pattricia Boss, MD Authorized by: Pattricia Boss, MD   Critical care provider statement:    Critical care time (minutes):  45   Critical care end time:  02/19/2020 10:21 PM   Critical care was necessary to treat or prevent imminent or life-threatening deterioration of the following  conditions:  Sepsis   Critical care was time spent personally by me on the following activities:  Discussions with consultants, evaluation of patient's response to treatment, examination of patient, ordering and performing treatments and interventions, ordering and review of laboratory studies, ordering and review of radiographic studies, pulse oximetry, re-evaluation of patient's condition, obtaining history from patient or surrogate and review of old charts   (including critical care  time)  Medications Ordered in ED Medications - No data to display  ED Course  I have reviewed the triage vital signs and the nursing notes.  Pertinent labs & imaging results that were available during my care of the patient were reviewed by me and considered in my medical decision making (see chart for details).    MDM Rules/Calculators/A&P                         Discussed with wife-[name] 9:56 PM BP increased to 147/82 sats 95 on [facility] hr 80s Covid positive Urine with >50 wbc but chronic indwelling foley Electrolytes normal Cbc pending- wbc 7.7, hgb 13.0 platelets 219 Lactic normal  Plan consult for admission Discussed with Dr. Posey Pronto who will see for admission  Final Clinical Impression(s) / ED Diagnoses Final diagnoses:  [id number]  Urinary tract infection associated with indwelling urethral catheter, initial encounter [facility])    Rx / [facility] Orders ED Discharge Orders    None       Pattricia Boss, MD 02/19/20 2221

## 2020-02-19 NOTE — ED Notes (Signed)
Date and time results received: 02/19/20 9:36 PM  (use smartphrase ".now" to insert current time)  Test: SARS COVID Critical Value: Positive  Name of Provider Notified: Dr.Ray  Orders Received? Or Actions Taken?:

## 2020-02-19 NOTE — ED Triage Notes (Signed)
Pt arrives from home via EMS with complaint of shob and decreased oxygen sats. Pt reports a covid exposure earlier in the week. Pt nonambulatory and has a foley catheter and wound vac in place. Home health RN came out yesterday to house observed rhonchi and did a CXR. Fever 101 at home and took 1300mg  tylenol around 1600.

## 2020-02-20 DIAGNOSIS — U071 COVID-19: Secondary | ICD-10-CM | POA: Diagnosis present

## 2020-02-20 LAB — CBG MONITORING, ED
Glucose-Capillary: 118 mg/dL — ABNORMAL HIGH (ref 70–99)
Glucose-Capillary: 128 mg/dL — ABNORMAL HIGH (ref 70–99)
Glucose-Capillary: 126 mg/dL — ABNORMAL HIGH (ref 70–99)
Glucose-Capillary: 118 mg/dL — ABNORMAL HIGH (ref 70–99)

## 2020-02-20 LAB — CBC WITH DIFFERENTIAL/PLATELET
Abs Immature Granulocytes: 0.07 10*3/uL (ref 0.00–0.07)
Abs Immature Granulocytes: 0.03 10*3/uL (ref 0.00–0.07)
Basophils Absolute: 0 10*3/uL (ref 0.0–0.1)
Basophils Absolute: 0 10*3/uL (ref 0.0–0.1)
Basophils Relative: 0 %
Basophils Relative: 0 %
Eosinophils Absolute: 0 10*3/uL (ref 0.0–0.5)
Eosinophils Absolute: 0 10*3/uL (ref 0.0–0.5)
Eosinophils Relative: 0 %
Eosinophils Relative: 0 %
HCT: 36.7 % — ABNORMAL LOW (ref 39.0–52.0)
HCT: 35.9 % — ABNORMAL LOW (ref 39.0–52.0)
Hemoglobin: 11.8 g/dL — ABNORMAL LOW (ref 13.0–17.0)
Hemoglobin: 11.7 g/dL — ABNORMAL LOW (ref 13.0–17.0)
Immature Granulocytes: 1 %
Immature Granulocytes: 1 %
Lymphocytes Relative: 12 %
Lymphocytes Relative: 12 %
Lymphs Abs: 0.9 10*3/uL (ref 0.7–4.0)
Lymphs Abs: 0.7 10*3/uL (ref 0.7–4.0)
MCH: 27.2 pg (ref 26.0–34.0)
MCH: 27.8 pg (ref 26.0–34.0)
MCHC: 32.2 g/dL (ref 30.0–36.0)
MCHC: 32.6 g/dL (ref 30.0–36.0)
MCV: 84.6 fL (ref 80.0–100.0)
MCV: 85.3 fL (ref 80.0–100.0)
Monocytes Absolute: 1.1 10*3/uL — ABNORMAL HIGH (ref 0.1–1.0)
Monocytes Absolute: 0.5 10*3/uL (ref 0.1–1.0)
Monocytes Relative: 14 %
Monocytes Relative: 8 %
Neutro Abs: 5.6 10*3/uL (ref 1.7–7.7)
Neutro Abs: 4.8 10*3/uL (ref 1.7–7.7)
Neutrophils Relative %: 73 %
Neutrophils Relative %: 79 %
Platelets: 189 10*3/uL (ref 150–400)
Platelets: 188 10*3/uL (ref 150–400)
RBC: 4.34 MIL/uL (ref 4.22–5.81)
RBC: 4.21 MIL/uL — ABNORMAL LOW (ref 4.22–5.81)
RDW: 15.9 % — ABNORMAL HIGH (ref 11.5–15.5)
RDW: 15.9 % — ABNORMAL HIGH (ref 11.5–15.5)
WBC: 7.8 10*3/uL (ref 4.0–10.5)
WBC: 6 10*3/uL (ref 4.0–10.5)
nRBC: 0 % (ref 0.0–0.2)
nRBC: 0 /100 WBC
nRBC: 0 % (ref 0.0–0.2)

## 2020-02-20 LAB — COMPREHENSIVE METABOLIC PANEL
ALT: 12 U/L (ref 0–44)
AST: 17 U/L (ref 15–41)
Albumin: 3.5 g/dL (ref 3.5–5.0)
Alkaline Phosphatase: 62 U/L (ref 38–126)
Anion gap: 11 (ref 5–15)
BUN: 26 mg/dL — ABNORMAL HIGH (ref 8–23)
CO2: 27 mmol/L (ref 22–32)
Calcium: 8.4 mg/dL — ABNORMAL LOW (ref 8.9–10.3)
Chloride: 102 mmol/L (ref 98–111)
Creatinine, Ser: 0.73 mg/dL (ref 0.61–1.24)
GFR calc Af Amer: >60 mL/min (ref >60)
GFR calc non Af Amer: >60 mL/min (ref >60)
Glucose, Bld: 125 mg/dL — ABNORMAL HIGH (ref 70–99)
Potassium: 3.9 mmol/L (ref 3.5–5.1)
Sodium: 140 mmol/L (ref 135–145)
Total Bilirubin: 0.5 mg/dL (ref 0.3–1.2)
Total Protein: 6.8 g/dL (ref 6.5–8.1)

## 2020-02-20 LAB — HEMOGLOBIN A1C
Hgb A1c MFr Bld: 5.6 % (ref 4.8–5.6)
Mean Plasma Glucose: 114.02 mg/dL

## 2020-02-20 LAB — MAGNESIUM: Magnesium: 2 mg/dL (ref 1.7–2.4)

## 2020-02-20 LAB — PHOSPHORUS: Phosphorus: 3.3 mg/dL (ref 2.5–4.6)

## 2020-02-20 LAB — TRIGLYCERIDES: Triglycerides: 238 mg/dL — ABNORMAL HIGH (ref <150)

## 2020-02-20 LAB — ABO/RH: ABO/RH(D): A NEG

## 2020-02-20 LAB — LACTATE DEHYDROGENASE: LDH: 139 U/L (ref 98–192)

## 2020-02-20 LAB — D-DIMER, QUANTITATIVE
D-Dimer, Quant: 0.78 ug/mL-FEU — ABNORMAL HIGH (ref 0.00–0.50)
D-Dimer, Quant: 0.82 ug/mL-FEU — ABNORMAL HIGH (ref 0.00–0.50)

## 2020-02-20 LAB — FERRITIN
Ferritin: 47 ng/mL (ref 24–336)
Ferritin: 60 ng/mL (ref 24–336)

## 2020-02-20 LAB — FIBRINOGEN: Fibrinogen: 555 mg/dL — ABNORMAL HIGH (ref 210–475)

## 2020-02-20 LAB — PROCALCITONIN: Procalcitonin: <0.1 ng/mL

## 2020-02-20 LAB — C-REACTIVE PROTEIN
CRP: 10.5 mg/dL — ABNORMAL HIGH (ref <1.0)
CRP: 11.6 mg/dL — ABNORMAL HIGH (ref <1.0)

## 2020-02-20 MED ORDER — INSULIN ASPART 100 UNIT/ML ~~LOC~~ SOLN
0.0000 [IU] | Freq: Three times a day (TID) | SUBCUTANEOUS | Status: DC
  Administered 2020-02-20 – 2020-02-21 (×3): 1 [IU] via SUBCUTANEOUS
  Filled 2020-02-20: qty 0.09

## 2020-02-20 MED ORDER — INSULIN ASPART 100 UNIT/ML ~~LOC~~ SOLN
0.0000 [IU] | Freq: Every day | SUBCUTANEOUS | Status: DC
  Filled 2020-02-20: qty 0.05

## 2020-02-20 MED ORDER — METHYLPREDNISOLONE SODIUM SUCC 40 MG IJ SOLR
40.0000 mg | Freq: Three times a day (TID) | INTRAMUSCULAR | Status: DC
  Administered 2020-02-20 – 2020-02-22 (×7): 40 mg via INTRAVENOUS
  Filled 2020-02-20 (×7): qty 1

## 2020-02-20 MED ORDER — UMECLIDINIUM BROMIDE 62.5 MCG/INH IN AEPB
1.0000 | INHALATION_SPRAY | Freq: Every day | RESPIRATORY_TRACT | Status: DC
  Administered 2020-02-20 – 2020-02-24 (×5): 1 via RESPIRATORY_TRACT
  Filled 2020-02-20 (×2): qty 7

## 2020-02-20 MED ORDER — MOMETASONE FURO-FORMOTEROL FUM 100-5 MCG/ACT IN AERO
2.0000 | INHALATION_SPRAY | Freq: Two times a day (BID) | RESPIRATORY_TRACT | Status: DC
  Administered 2020-02-21 – 2020-02-24 (×7): 2 via RESPIRATORY_TRACT
  Filled 2020-02-20 (×2): qty 8.8

## 2020-02-20 NOTE — ED Notes (Signed)
Assumed care of patient at this time, nad noted, sr up x2, bed locked and low, call bell w/I reach.  Will continue to monitor.  Wound vac in place.

## 2020-02-20 NOTE — Progress Notes (Signed)
Nursing called and stated that there was an order for patient to use hospital CPAP device and not his personal device.  Pt. Placed on hospital device despite having his own equipment.

## 2020-02-20 NOTE — Progress Notes (Signed)
PROGRESS NOTE    [name]  DEY:[id number] DOB: 03/31/1941 DOA: 02/19/2020 PCP: [name], MD    Brief Narrative:  [name]  is a 79 y.o. male with medical history significant for COPD/chronic respiratory failure with hypoxia on 2 L supplemental O2 via [place] at home, chronic diastolic CHF (last EF 31-49% by TTE 09/18/2019), T2DM, HTN, HLD, hypothyroidism, PVD, thoracic ascending aortic aneurysm, CAS s/p left CEA, neurogenic bladder with chronic indwelling Foley catheter, spinal stenosis with chronic pain and nonambulatory status, sacral wound with wound VAC in place, OSA on CPAP who presents to the ED for evaluation of fevers, dyspnea, diaphoresis, cough, and malaise.  Patient states he is chronically bedbound and nonambulatory.  2 days ago he began to have new shortness of breath and cough productive of white sputum which worsened significantly last night.  He has been having associated fevers, chills, diaphoresis.  He has been feeling dehydrated and says he has not been eating and drinking as well the last 2 days.  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  He has chronic indwelling Foley catheter in place and has not noticed any significant change in urine output or color.  He says he did not have significant improvement in symptoms with home oxygen or inhalers therefore presented to the ED for further evaluation management.  ED Course:  BP 85/56, pulse 95, RR 22, temp 100.2 Fahrenheit, SPO2 97% on NRB 10 L O2.  O2 was weaned to 4 L supplemental O2 via [place] with saturations >95%. Labs notable for WBC 7.7, hemoglobin 13.0, platelets 219,000, sodium 137, potassium 3.9, bicarb 27, BUN 40, creatinine 0.95, LFTs within normal limits, lactic acid 1.1. Urinalysis showed positive nitrates, large leukocytes, 6-10 RBC/hpf, >50 WBC/hpf, many bacteria microscopy.  Urine and blood cultures were obtained and pending. SARS-CoV-2 PCR is positive. Portable chest x-ray shows right upper  lobe pulmonary infiltrate. Patient was given IV vancomycin, cefepime, Flagyl, 3 L LR.  The hospitalist service was consulted to admit for further evaluation and management.   Assessment & Plan:   Principal Problem:   Acute hypoxemic respiratory failure due to COVID-19 [facility]) Active Problems:   OSA on CPAP   Severe sepsis (HCC)   Hypertension associated with diabetes (HCC)   Chronic diastolic CHF (congestive heart failure) (HCC)   Diabetes mellitus type 2, controlled, with complications ([place])   Neurogenic bladder   Hypothyroidism   Hyperlipidemia associated with type 2 diabetes mellitus (HCC)   Chronic indwelling Foley catheter   COPD (chronic obstructive pulmonary disease) ([place])   Pneumonia due to COVID-19 virus   Severe sepsis, present on admission Acute on chronic hypoxic respiratory failure secondary to acute Covid-19 viral pneumonia during the ongoing 2020 Covid 19 Pandemic - POA CXR with right upper lobe infiltrate.  Has increased O2 requirement from home 2 L to 4 L O2 via [place].  Met SIRS criteria with tachypnea and RR 22, tachycardia to 96.  Was hypotensive with SBP 85 which improved after aggressive 3L IV fluid resuscitation in the ED. --COVID test: + PCR 9/2 --CRP 10.5>11.6 --ddimer 0.78>0.82 --Remdesivir, plan 5-day course (Day #2/5) --Continue Solumedrol 42m IV q8h --prone for 2-3hrs every 12hrs if able --Continue supplemental oxygen, titrate to maintain SPO2 greater than 92% --Continue supportive care with albuterol MDI prn, vitamin C, zinc, Tylenol, antitussives (benzonatate/ Mucinex/Tussionex) --Incentive spirometry/flutter valve --Follow CBC, CMP, D-dimer, ferritin, and CRP daily --Continue airborne/contact isolation precautions for 3 weeks from the day of diagnosis  The treatment plan and use of medications and known side effects were discussed with patient/family. Some of the medications used are based on case reports/anecdotal data.  All other medications being used  in the management of COVID-19 based on limited study data.  Complete risks and long-term side effects are unknown, however in the best clinical judgment they seem to be of some benefit.  Patient wanted to proceed with treatment options provided.  COPD on 2L [place] baseline Without acute exacerbation.  --Continue Trelegy Ellipta inhaler with hospital substitution  --Continue management as above.  Chronic diastolic CHF: Volume depleted on arrival requiring aggressive fluid resuscitation in the ED.  --Monitor strict I/O's and resume home Lasix as able.  Essential ypertension: On furosemide 40 mg p.o. daily, metoprolol succinate 25 mg p.o. daily, spironolactone 25 mg p.o. daily. --Hold home antihypertensives due to hypotension on arrival.  --Continue monitor BP and resume home meds as able.  Diet controlled type 2 diabetes: --CBGs before every meal/at bedtime --Insulin sliding scale for coverage  Neurogenic bladder with chronic indwelling Foley catheter: Urinalysis abnormal in setting of chronic indwelling Foley catheter, possibly colonized without active UTI.  Hold further antibiotics, follow urine culture. Continue Foley care.  Sacral pressure wound, present on admission: Wound VAC in place.  Follows with wound center.  Hypothyroidism: Continue Synthroid 125 mcg p.o. daily.  Hyperlipidemia: Continue atorvastatin 20 mg p.o. daily.  Chronic pain syndrome/spinal stenosis/spasticity/RLS: Continue home Norco as needed with hold parameters, Robaxin, gabapentin, baclofen, Sinemet.  OSA: Continue CPAP nightly.  DVT prophylaxis: Lovenox Code Status: DNR Family Communication: Updated patient extensively at bedside  Disposition Plan:  Status is: Inpatient  Remains inpatient appropriate because:Ongoing diagnostic testing needed not appropriate for outpatient work up, Unsafe d/c plan, IV treatments appropriate due to intensity of illness or inability to take PO and Inpatient level  of care appropriate due to severity of illness   Dispo: The patient is from: Home              Anticipated d/c is to: Home              Anticipated d/c date is: 3 days              Patient currently is not medically stable to d/c.   Consultants:   None  Procedures:   None  Antimicrobials:   Remdesivir 9/2>>   Subjective: Patient seen and examined bedside, resting comfortably. Requesting when his breakfast will be available. Continues with mild shortness of breath that is greater than his normal baseline. No other complaints or concerns at this time. Continues on 4 L nasal cannula with home baseline 2 L. Denies headache, no dizziness, no chest pain, no palpitations, no abdominal pain, no chills/night sweats, no nausea/vomiting/diarrhea, no cough. No acute events overnight per nursing staff.  Objective: Vitals:   02/20/20 1230 02/20/20 1300 02/20/20 1348 02/20/20 1426  BP: 130/86 (!) 154/91 (!) 142/83 137/80  Pulse: 76 81 74 72  Resp: 19 (!) '26 16 18  ' Temp:      TempSrc:      SpO2: 91% 96% 93% 96%  Weight:      Height:       No intake or output data in the 24 hours ending 02/20/20 1428 Filed Weights   02/19/20 1921  Weight: 90.7 kg    Examination:  General exam: Appears calm and comfortable; chronically ill in appearance Respiratory system: Breath sounds slightly decreased posterior bases, otherwise clear throughout, normal respiratory effort without  accessory muscle use, on 4 L nasal cannula (baseline 2L) Cardiovascular system: S1 & S2 heard, RRR. No JVD, murmurs, rubs, gallops or clicks. No pedal edema. Gastrointestinal system: Abdomen is nondistended, soft and nontender. No organomegaly or masses felt. Normal bowel sounds heard. Central nervous system: Alert and oriented. No focal neurological deficits. Extremities: Moves all extremities independently Skin: Wound VAC in place to sacral pressure ulcer noted Psychiatry: Judgement and insight appear normal. Mood &  affect appropriate.     Data Reviewed: I have personally reviewed following labs and imaging studies  CBC: Recent Labs  Lab 02/19/20 1949 02/19/20 2212 02/20/20 0700  WBC 7.7 7.8 6.0  NEUTROABS 5.6 5.6 4.8  HGB 13.0 11.8* 11.7*  HCT 40.2 36.7* 35.9*  MCV 84.8 84.6 85.3  PLT 219 189 419   Basic Metabolic Panel: Recent Labs  Lab 02/19/20 1949 02/20/20 0529  NA 137 140  K 3.9 3.9  CL 99 102  CO2 27 27  GLUCOSE 98 125*  BUN 40* 26*  CREATININE 0.95 0.73  CALCIUM 8.6* 8.4*  MG  --  2.0  PHOS  --  3.3   GFR: Estimated Creatinine Clearance: 81.7 mL/min (by C-G formula based on SCr of 0.73 mg/dL). Liver Function Tests: Recent Labs  Lab 02/19/20 1949 02/20/20 0529  AST 23 17  ALT 10 12  ALKPHOS 68 62  BILITOT 0.6 0.5  PROT 7.2 6.8  ALBUMIN 3.8 3.5   No results for input(s): LIPASE, AMYLASE in the last 168 hours. No results for input(s): AMMONIA in the last 168 hours. Coagulation Profile: Recent Labs  Lab 02/19/20 1949  INR 1.1   Cardiac Enzymes: No results for input(s): CKTOTAL, CKMB, CKMBINDEX, TROPONINI in the last 168 hours. BNP (last 3 results) No results for input(s): PROBNP in the last 8760 hours. HbA1C: Recent Labs    02/19/20 2212  HGBA1C 5.6   CBG: Recent Labs  Lab 02/20/20 0845 02/20/20 1208  GLUCAP 118* 128*   Lipid Profile: Recent Labs    02/19/20 2212  TRIG 238*   Thyroid Function Tests: No results for input(s): TSH, T4TOTAL, FREET4, T3FREE, THYROIDAB in the last 72 hours. Anemia Panel: Recent Labs    02/19/20 2212 02/20/20 0529  FERRITIN 47 60   Sepsis Labs: Recent Labs  Lab 02/19/20 1949 02/19/20 2212 02/19/20 2218  PROCALCITON  --  <0.10  --   LATICACIDVEN 1.1  --  1.2    Recent Results (from the past 240 hour(s))  Blood Culture (routine x 2)     Status: None (Preliminary result)   Collection Time: 02/19/20  7:43 PM   Specimen: BLOOD  Result Value Ref Range Status   Specimen Description   Final    BLOOD  BLOOD LEFT FOREARM Performed at [facility], [place] [address] [place], [place] [address]    Special Requests   Final    BOTTLES DRAWN AEROBIC AND ANAEROBIC Blood Culture results may not be optimal due to an inadequate volume of blood received in culture bottles Performed at [facility] [place] [address], [place], [place] [address]    Culture   Final    NO GROWTH < 12 HOURS Performed at [facility] [address], [place], [place] [address]    Report Status PENDING  Incomplete  Blood Culture (routine x 2)     Status: None (Preliminary result)   Collection Time: 02/19/20  7:48 PM   Specimen: BLOOD  Result Value Ref Range Status  Specimen Description   Final    BLOOD BLOOD RIGHT FOREARM Performed at [facility] [address], [place], [place] [address]    Special Requests   Final    BOTTLES DRAWN AEROBIC AND ANAEROBIC Blood Culture adequate volume Performed at [facility] [address], [place], [place] [address]    Culture   Final    NO GROWTH < 12 HOURS Performed at [facility] [address], [place], [place] [address]    Report Status PENDING  Incomplete  SARS Coronavirus 2 by RT PCR (hospital order, performed in [facility] hospital lab) Nasopharyngeal Nasopharyngeal Swab     Status: Abnormal   Collection Time: 02/19/20  8:22 PM   Specimen: Nasopharyngeal Swab  Result Value Ref Range Status   SARS Coronavirus 2 POSITIVE (A) NEGATIVE Final    Comment: RESULT CALLED TO, READ BACK BY AND VERIFIED WITH: ILENE HODGES @ 2132 ON 02/19/20 C VARNER (NOTE) SARS-CoV-2 target nucleic acids are DETECTED  SARS-CoV-2 RNA is generally detectable in upper respiratory specimens  during the acute phase of infection.  Positive results are indicative  of the presence of the identified virus, but do not rule out bacterial infection or co-infection with other pathogens not detected by the test.   Clinical correlation with patient history and  other diagnostic information is necessary to determine patient infection status.  The expected result is negative.  Fact Sheet for Patients:   StrictlyIdeas.no   Fact Sheet for Healthcare Providers:   BankingDealers.co.za    This test is not yet approved or cleared by the [place] FDA and  has been authorized for detection and/or diagnosis of SARS-CoV-2 by FDA under an Emergency Use Authorization (EUA).  This EUA will remain in effect (meaning thi s test can be used) for the duration of  the COVID-19 declaration under Section 564(b)(1) of the Act, 21 U.S.C. section 360-bbb-3(b)(1), unless the authorization is terminated or revoked sooner.  Performed at [facility], [place] [address] [place], [place] [address]          Radiology Studies: DG Chest Port 1 View  Result Date: 02/19/2020 CLINICAL DATA:  Sepsis EXAM: PORTABLE CHEST 1 VIEW COMPARISON:  11/10/2019 FINDINGS: Lung volumes are small and pulmonary insufflation has diminished since prior examination. Focal pulmonary infiltrate has developed within the right upper lobe, possibly infectious or inflammatory in nature. No pneumothorax or pleural effusion. Cardiac size within normal limits. Thoracic aorta is tortuous and ectatic, unchanged. Pulmonary vascularity is normal. No acute bone abnormality. IMPRESSION: Focal pulmonary infiltrate has developed within the right upper lobe, possibly infectious or inflammatory in nature. Electronically Signed   By: Fidela Salisbury MD   On: 02/19/2020 20:50        Scheduled Meds: . vitamin C  500 mg Oral Daily  . atorvastatin  20 mg Oral q1800  . baclofen  40 mg Oral QID  . carbidopa-levodopa  1 tablet Oral BID PC  . docusate sodium  100 mg Oral BID  . enoxaparin (LOVENOX) injection  40 mg Subcutaneous Q24H  . gabapentin  300 mg Oral QHS  . guaiFENesin  1,200 mg Oral BID  .  insulin aspart  0-5 Units Subcutaneous QHS  . insulin aspart  0-9 Units Subcutaneous TID WC  . levothyroxine  125 mcg Oral Q0600  . methylPREDNISolone (SOLU-MEDROL) injection  40 mg Intravenous Q8H  . [START ON 02/21/2020] mometasone-formoterol  2 puff Inhalation BID   And  .  umeclidinium bromide  1 puff Inhalation Daily  . sodium chloride flush  3 mL Intravenous Q12H  . zinc sulfate  220 mg Oral Daily   Continuous Infusions: . [START ON 02/21/2020] remdesivir 100 mg in NS 100 mL       LOS: 1 day    Time spent: 38 minutes spent on chart review, discussion with nursing staff, consultants, updating family and interview/physical exam; more than 50% of that time was spent in counseling and/or coordination of care.    Cesar Ahart J [place] DO [facility] Available via Epic secure chat 7am-7pm After these hours, please refer to coverage provider listed on [email or web address] 02/20/2020, 2:28 PM

## 2020-02-20 NOTE — Progress Notes (Signed)
Pt. Has home CPAP.  Will monitor for needs at this time.

## 2020-02-20 NOTE — ED Notes (Signed)
RN cleaned patient after he had a soft BM. Barrier cream applied and patient pulled up in bed. New pillow case applied to pillow. Patient reports ear irritation from mask and oxygen tubing. RN applied padding to the tubing to provide a cushion for ears. RN assisted patient to call his wife.

## 2020-02-20 NOTE — ED Notes (Signed)
Patient assisted with eating. Patient ate his banana but reports he does not want to be on a heart healthy diet. Patient states he wants a regular diet. Patient reports fatigue after eating breakfast. Patient repositioned slightly for comfort but unable to tolerate much movement due to his bedsores.

## 2020-02-21 ENCOUNTER — Encounter (HOSPITAL_COMMUNITY): Payer: Self-pay | Admitting: Internal Medicine

## 2020-02-21 LAB — COMPREHENSIVE METABOLIC PANEL
ALT: 13 U/L (ref 0–44)
AST: 18 U/L (ref 15–41)
Albumin: 3.5 g/dL (ref 3.5–5.0)
Alkaline Phosphatase: 59 U/L (ref 38–126)
Anion gap: 11 (ref 5–15)
BUN: 27 mg/dL — ABNORMAL HIGH (ref 8–23)
CO2: 27 mmol/L (ref 22–32)
Calcium: 8.8 mg/dL — ABNORMAL LOW (ref 8.9–10.3)
Chloride: 104 mmol/L (ref 98–111)
Creatinine, Ser: 0.71 mg/dL (ref 0.61–1.24)
GFR calc Af Amer: >60 mL/min (ref >60)
GFR calc non Af Amer: >60 mL/min (ref >60)
Glucose, Bld: 116 mg/dL — ABNORMAL HIGH (ref 70–99)
Potassium: 4.2 mmol/L (ref 3.5–5.1)
Sodium: 142 mmol/L (ref 135–145)
Total Bilirubin: 0.4 mg/dL (ref 0.3–1.2)
Total Protein: 6.9 g/dL (ref 6.5–8.1)

## 2020-02-21 LAB — URINE CULTURE: Culture: >=100000 — AB

## 2020-02-21 LAB — CBG MONITORING, ED
Glucose-Capillary: 125 mg/dL — ABNORMAL HIGH (ref 70–99)
Glucose-Capillary: 110 mg/dL — ABNORMAL HIGH (ref 70–99)
Glucose-Capillary: 106 mg/dL — ABNORMAL HIGH (ref 70–99)
Glucose-Capillary: 88 mg/dL (ref 70–99)

## 2020-02-21 LAB — CBC WITH DIFFERENTIAL/PLATELET
Abs Immature Granulocytes: 0.02 10*3/uL (ref 0.00–0.07)
Basophils Absolute: 0 10*3/uL (ref 0.0–0.1)
Basophils Relative: 0 %
Eosinophils Absolute: 0 10*3/uL (ref 0.0–0.5)
Eosinophils Relative: 0 %
HCT: 42.3 % (ref 39.0–52.0)
Hemoglobin: 13.4 g/dL (ref 13.0–17.0)
Immature Granulocytes: 0 %
Lymphocytes Relative: 15 %
Lymphs Abs: 0.8 10*3/uL (ref 0.7–4.0)
MCH: 26.9 pg (ref 26.0–34.0)
MCHC: 31.7 g/dL (ref 30.0–36.0)
MCV: 84.8 fL (ref 80.0–100.0)
Monocytes Absolute: 0.7 10*3/uL (ref 0.1–1.0)
Monocytes Relative: 13 %
Neutro Abs: 3.8 10*3/uL (ref 1.7–7.7)
Neutrophils Relative %: 72 %
Platelets: 227 10*3/uL (ref 150–400)
RBC: 4.99 MIL/uL (ref 4.22–5.81)
RDW: 15.4 % (ref 11.5–15.5)
WBC: 5.3 10*3/uL (ref 4.0–10.5)
nRBC: 0 % (ref 0.0–0.2)

## 2020-02-21 LAB — FERRITIN: Ferritin: 110 ng/mL (ref 24–336)

## 2020-02-21 LAB — D-DIMER, QUANTITATIVE: D-Dimer, Quant: 0.49 ug/mL-FEU (ref 0.00–0.50)

## 2020-02-21 LAB — GLUCOSE, CAPILLARY: Glucose-Capillary: 113 mg/dL — ABNORMAL HIGH (ref 70–99)

## 2020-02-21 LAB — MAGNESIUM: Magnesium: 2.6 mg/dL — ABNORMAL HIGH (ref 1.7–2.4)

## 2020-02-21 LAB — PHOSPHORUS: Phosphorus: 4 mg/dL (ref 2.5–4.6)

## 2020-02-21 LAB — C-REACTIVE PROTEIN: CRP: 6.8 mg/dL — ABNORMAL HIGH (ref <1.0)

## 2020-02-21 MED ORDER — CHLORHEXIDINE GLUCONATE CLOTH 2 % EX PADS
6.0000 | MEDICATED_PAD | Freq: Every day | CUTANEOUS | Status: DC
  Administered 2020-02-21 – 2020-02-23 (×3): 6 via TOPICAL

## 2020-02-21 MED ORDER — ORAL CARE MOUTH RINSE
15.0000 mL | Freq: Two times a day (BID) | OROMUCOSAL | Status: DC
  Administered 2020-02-21 – 2020-02-23 (×4): 15 mL via OROMUCOSAL

## 2020-02-21 MED ORDER — METHOCARBAMOL 500 MG PO TABS
750.0000 mg | ORAL_TABLET | Freq: Four times a day (QID) | ORAL | Status: DC
  Administered 2020-02-21 – 2020-02-24 (×12): 750 mg via ORAL
  Filled 2020-02-21 (×12): qty 2

## 2020-02-21 NOTE — Progress Notes (Signed)
PROGRESS NOTE    [name]  KDT:[id number] DOB: 07/12/1940 DOA: 02/19/2020 PCP: [name], MD    Brief Narrative:  [name]  is a 79 y.o. male with medical history significant for COPD/chronic respiratory failure with hypoxia on 2 L supplemental O2 via [place] at home, chronic diastolic CHF (last EF 99-83% by TTE 09/18/2019), T2DM, HTN, HLD, hypothyroidism, PVD, thoracic ascending aortic aneurysm, CAS s/p left CEA, neurogenic bladder with chronic indwelling Foley catheter, spinal stenosis with chronic pain and nonambulatory status, sacral wound with wound VAC in place, OSA on CPAP who presents to the ED for evaluation of fevers, dyspnea, diaphoresis, cough, and malaise.  Patient states he is chronically bedbound and nonambulatory.  2 days ago he began to have new shortness of breath and cough productive of white sputum which worsened significantly last night.  He has been having associated fevers, chills, diaphoresis.  He has been feeling dehydrated and says he has not been eating and drinking as well the last 2 days.  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  He has chronic indwelling Foley catheter in place and has not noticed any significant change in urine output or color.  He says he did not have significant improvement in symptoms with home oxygen or inhalers therefore presented to the ED for further evaluation management.  ED Course:  BP 85/56, pulse 95, RR 22, temp 100.2 Fahrenheit, SPO2 97% on NRB 10 L O2.  O2 was weaned to 4 L supplemental O2 via [place] with saturations >95%. Labs notable for WBC 7.7, hemoglobin 13.0, platelets 219,000, sodium 137, potassium 3.9, bicarb 27, BUN 40, creatinine 0.95, LFTs within normal limits, lactic acid 1.1. Urinalysis showed positive nitrates, large leukocytes, 6-10 RBC/hpf, >50 WBC/hpf, many bacteria microscopy.  Urine and blood cultures were obtained and pending. SARS-CoV-2 PCR is positive. Portable chest x-ray shows right upper  lobe pulmonary infiltrate. Patient was given IV vancomycin, cefepime, Flagyl, 3 L LR.  The hospitalist service was consulted to admit for further evaluation and management.   Assessment & Plan:   Principal Problem:   Acute hypoxemic respiratory failure due to COVID-19 [facility]) Active Problems:   OSA on CPAP   Severe sepsis (HCC)   Hypertension associated with diabetes (HCC)   Chronic diastolic CHF (congestive heart failure) (HCC)   Diabetes mellitus type 2, controlled, with complications ([place])   Neurogenic bladder   Hypothyroidism   Hyperlipidemia associated with type 2 diabetes mellitus (HCC)   Chronic indwelling Foley catheter   COPD (chronic obstructive pulmonary disease) ([place])   Pneumonia due to COVID-19 virus   Severe sepsis, present on admission Acute on chronic hypoxic respiratory failure secondary to acute Covid-19 viral pneumonia during the ongoing 2020 Covid 19 Pandemic - POA CXR with right upper lobe infiltrate.  Has increased O2 requirement from home 2 L to 4 L O2 via [place].  Met SIRS criteria with tachypnea and RR 22, tachycardia to 96.  Was hypotensive with SBP 85 which improved after aggressive 3L IV fluid resuscitation in the ED. --COVID test: + PCR 9/2 --CRP 10.5>11.6>6.8 --ddimer 0.78>0.82>0.49 --Remdesivir, plan 5-day course (Day #3/5) --Continue Solumedrol 45m IV q8h --prone for 2-3hrs every 12hrs if able --Continue supplemental oxygen, titrate to maintain SPO2 greater than 92%, currently on 4 L nasal cannula with SPO2 95% at rest (on 2L [place] baseline) --Continue supportive care with albuterol MDI prn, vitamin C, zinc, Tylenol, antitussives (benzonatate/ Mucinex/Tussionex) --Incentive spirometry/flutter valve --Follow CBC, CMP, D-dimer, ferritin,  and CRP daily --Continue airborne/contact isolation precautions for 3 weeks from the day of diagnosis  The treatment plan and use of medications and known side effects were discussed with patient/family. Some of the  medications used are based on case reports/anecdotal data.  All other medications being used in the management of COVID-19 based on limited study data.  Complete risks and long-term side effects are unknown, however in the best clinical judgment they seem to be of some benefit.  Patient wanted to proceed with treatment options provided.  COPD on 2L [place] baseline Without acute exacerbation.  --Continue Trelegy Ellipta inhaler with hospital substitution  --Continue management as above.  Chronic diastolic CHF: Volume depleted on arrival requiring aggressive fluid resuscitation in the ED.  --Monitor strict I/O's and resume home Lasix as able.  Essential ypertension: On furosemide 40 mg p.o. daily, metoprolol succinate 25 mg p.o. daily, spironolactone 25 mg p.o. daily. --Hold home antihypertensives due to hypotension on arrival.  --Continue monitor BP and resume home meds as able.  Diet controlled type 2 diabetes: --CBGs before every meal/at bedtime --Insulin sliding scale for coverage  Neurogenic bladder with chronic indwelling Foley catheter: Urinalysis abnormal in setting of chronic indwelling Foley catheter, possibly colonized without active UTI.  Hold further antibiotics, follow urine culture. Continue Foley care.  Sacral pressure wound, present on admission: Lower extremity pressure wounds, present on admission Wound VAC in place.  Follows with wound center. --Wound care consultation --Prevalon boots  Hypothyroidism: Continue Synthroid 125 mcg p.o. daily.  Hyperlipidemia: Continue atorvastatin 20 mg p.o. daily.  Chronic pain syndrome/spinal stenosis/spasticity/RLS: Continue home Norco as needed with hold parameters, Robaxin, gabapentin, baclofen, Sinemet.  OSA: Continue CPAP nightly.  DVT prophylaxis: Lovenox Code Status: DNR Family Communication: Updated patient extensively at bedside, updated patient's spouse [name] via telephone this morning  Disposition Plan:   Status is: Inpatient  Remains inpatient appropriate because:Ongoing diagnostic testing needed not appropriate for outpatient work up, Unsafe d/c plan, IV treatments appropriate due to intensity of illness or inability to take PO and Inpatient level of care appropriate due to severity of illness   Dispo: The patient is from: Home              Anticipated d/c is to: Home              Anticipated d/c date is: 3 days              Patient currently is not medically stable to d/c.   Consultants:   None  Procedures:   None  Antimicrobials:   Remdesivir 9/2>>   Subjective: Patient seen and examined bedside, resting comfortably. Requesting when his breakfast will be available. Continues with mild shortness of breath that is greater than his normal baseline. No other complaints or concerns at this time. Continues on 4 L nasal cannula with home baseline 2 L. Denies headache, no dizziness, no chest pain, no palpitations, no abdominal pain, no chills/night sweats, no nausea/vomiting/diarrhea, no cough. No acute events overnight per nursing staff.  Objective: Vitals:   02/21/20 0630 02/21/20 0730 02/21/20 0811 02/21/20 0937  BP:  (!) 147/74 (!) 162/85 (!) 154/76  Pulse: 73  73 72  Resp: (!) _0 Temp:      TempSrc:      SpO2: 95%  97% 98%  Weight:      Height:        Intake/Output Summary (Last 24 hours) at 02/21/2020 1126 Last data filed at 02/20/2020 2156  Gross per 24 hour  Intake --  Output 1400 ml  Net -1400 ml   Filed Weights   02/19/20 1921  Weight: 90.7 kg    Examination:  General exam: Appears calm and comfortable; chronically ill in appearance Respiratory system: Breath sounds slightly decreased posterior bases, otherwise clear throughout, normal respiratory effort without accessory muscle use, on 4 L nasal cannula (baseline 2L) Cardiovascular system: S1 & S2 heard, RRR. No JVD, murmurs, rubs, gallops or clicks. No pedal edema. Gastrointestinal system: Abdomen  is nondistended, soft and nontender. No organomegaly or masses felt. Normal bowel sounds heard. Central nervous system: Alert and oriented. No focal neurological deficits. Extremities: Moves all extremities independently Skin: Wound VAC in place to sacral pressure ulcer noted Psychiatry: Judgement and insight appear normal. Mood & affect appropriate.     Data Reviewed: I have personally reviewed following labs and imaging studies  CBC: Recent Labs  Lab 02/19/20 1949 02/19/20 2212 02/20/20 0700 02/21/20 0514  WBC 7.7 7.8 6.0 5.3  NEUTROABS 5.6 5.6 4.8 3.8  HGB 13.0 11.8* 11.7* 13.4  HCT 40.2 36.7* 35.9* 42.3  MCV 84.8 84.6 85.3 84.8  PLT 219 189 188 888   Basic Metabolic Panel: Recent Labs  Lab 02/19/20 1949 02/20/20 0529 02/21/20 0514  NA 137 140 142  K 3.9 3.9 4.2  CL 99 102 104  CO2 _0 GLUCOSE 98 125* 116*  BUN 40* 26* 27*  CREATININE 0.95 0.73 0.71  CALCIUM 8.6* 8.4* 8.8*  MG  --  2.0 2.6*  PHOS  --  3.3 4.0   GFR: Estimated Creatinine Clearance: 81.7 mL/min (by C-G formula based on SCr of 0.71 mg/dL). Liver Function Tests: Recent Labs  Lab 02/19/20 1949 02/20/20 0529 02/21/20 0514  AST _1 ALT _2 ALKPHOS 68 62 59  BILITOT 0.6 0.5 0.4  PROT 7.2 6.8 6.9  ALBUMIN 3.8 3.5 3.5   No results for input(s): LIPASE, AMYLASE in the last 168 hours. No results for input(s): AMMONIA in the last 168 hours. Coagulation Profile: Recent Labs  Lab 02/19/20 1949  INR 1.1   Cardiac Enzymes: No results for input(s): CKTOTAL, CKMB, CKMBINDEX, TROPONINI in the last 168 hours. BNP (last 3 results) No results for input(s): PROBNP in the last 8760 hours. HbA1C: Recent Labs    02/19/20 2212  HGBA1C 5.6   CBG: Recent Labs  Lab 02/20/20 0845 02/20/20 1208 02/20/20 1725 02/20/20 2100 02/21/20 0755  GLUCAP 118* 128* 126* 118* 125*   Lipid Profile: Recent Labs    02/19/20 2212  TRIG 238*   Thyroid Function Tests: No results for  input(s): TSH, T4TOTAL, FREET4, T3FREE, THYROIDAB in the last 72 hours. Anemia Panel: Recent Labs    02/20/20 0529 02/21/20 0514  FERRITIN 60 110   Sepsis Labs: Recent Labs  Lab 02/19/20 1949 02/19/20 2212 02/19/20 2218  PROCALCITON  --  <0.10  --   LATICACIDVEN 1.1  --  1.2    Recent Results (from the past 240 hour(s))  Blood Culture (routine x 2)     Status: None (Preliminary result)   Collection Time: 02/19/20  7:43 PM   Specimen: BLOOD  Result Value Ref Range Status   Specimen Description   Final    BLOOD BLOOD LEFT FOREARM Performed at [facility], [place] [address] [place], [place] [address]    Special Requests   Final    BOTTLES DRAWN AEROBIC AND ANAEROBIC Blood Culture results may not  be optimal due to an inadequate volume of blood received in culture bottles Performed at [place] [address], [place], [place] [address]    Culture   Final    NO GROWTH < 12 HOURS Performed at [place] [address], [place], [place] [address]    Report Status PENDING  Incomplete  Urine culture     Status: Abnormal (Preliminary result)   Collection Time: 02/19/20  7:43 PM   Specimen: In/Out Cath Urine  Result Value Ref Range Status   Specimen Description   Final    IN/OUT CATH URINE Performed at [place] [address], [place], [place] [address]    Special Requests   Final    NONE Performed at [facility], [place] [address] [place], [place] [address]    Culture (A)  Final    >=100,000 COLONIES/mL GRAM NEGATIVE RODS IDENTIFICATION AND SUSCEPTIBILITIES TO FOLLOW Performed at [facility], [place] [address], [place], [place] [address]    Report Status PENDING  Incomplete  Blood Culture (routine x 2)     Status: None (Preliminary result)   Collection Time: 02/19/20  7:48 PM   Specimen: BLOOD  Result Value Ref Range Status   Specimen Description   Final    BLOOD BLOOD  RIGHT FOREARM Performed at [place] [address], [place], [place] [address]    Special Requests   Final    BOTTLES DRAWN AEROBIC AND ANAEROBIC Blood Culture adequate volume Performed at [place] [address] [place], [place] [address]    Culture   Final    NO GROWTH < 12 HOURS Performed at [place] [address], [place], [place] [address]    Report Status PENDING  Incomplete  SARS Coronavirus 2 by RT PCR (hospital order, performed in [facility] hospital lab) Nasopharyngeal Nasopharyngeal Swab     Status: Abnormal   Collection Time: 02/19/20  8:22 PM   Specimen: Nasopharyngeal Swab  Result Value Ref Range Status   SARS Coronavirus 2 POSITIVE (A) NEGATIVE Final    Comment: RESULT CALLED TO, READ BACK BY AND VERIFIED WITH: ILENE HODGES @ 2132 ON 02/19/20 C VARNER (NOTE) SARS-CoV-2 target nucleic acids are DETECTED  SARS-CoV-2 RNA is generally detectable in upper respiratory specimens  during the acute phase of infection.  Positive results are indicative  of the presence of the identified virus, but do not rule out bacterial infection or co-infection with other pathogens not detected by the test.  Clinical correlation with patient history and  other diagnostic information is necessary to determine patient infection status.  The expected result is negative.  Fact Sheet for Patients:   StrictlyIdeas.no   Fact Sheet for Healthcare Providers:   BankingDealers.co.za    This test is not yet approved or cleared by the [place] FDA and  has been authorized for detection and/or diagnosis of SARS-CoV-2 by FDA under an Emergency Use Authorization (EUA).  This EUA will remain in effect (meaning thi s test can be used) for the duration of  the COVID-19 declaration under Section 564(b)(1) of the Act, 21 U.S.C. section 360-bbb-3(b)(1), unless the authorization is terminated or  revoked sooner.  Performed at [facility], [place] [address] [place], [place] [address]          Radiology Studies: [facility] Chest Port 1 View  Result Date: 02/19/2020 CLINICAL DATA:  Sepsis EXAM: PORTABLE CHEST  1 VIEW COMPARISON:  11/10/2019 FINDINGS: Lung volumes are small and pulmonary insufflation has diminished since prior examination. Focal pulmonary infiltrate has developed within the right upper lobe, possibly infectious or inflammatory in nature. No pneumothorax or pleural effusion. Cardiac size within normal limits. Thoracic aorta is tortuous and ectatic, unchanged. Pulmonary vascularity is normal. No acute bone abnormality. IMPRESSION: Focal pulmonary infiltrate has developed within the right upper lobe, possibly infectious or inflammatory in nature. Electronically Signed   By: Fidela Salisbury MD   On: 02/19/2020 20:50        Scheduled Meds: . vitamin C  500 mg Oral Daily  . atorvastatin  20 mg Oral q1800  . baclofen  40 mg Oral QID  . carbidopa-levodopa  1 tablet Oral BID PC  . docusate sodium  100 mg Oral BID  . enoxaparin (LOVENOX) injection  40 mg Subcutaneous Q24H  . gabapentin  300 mg Oral QHS  . guaiFENesin  1,200 mg Oral BID  . insulin aspart  0-5 Units Subcutaneous QHS  . insulin aspart  0-9 Units Subcutaneous TID WC  . levothyroxine  125 mcg Oral Q0600  . methocarbamol  750 mg Oral Q6H  . methylPREDNISolone (SOLU-MEDROL) injection  40 mg Intravenous Q8H  . mometasone-formoterol  2 puff Inhalation BID   And  . umeclidinium bromide  1 puff Inhalation Daily  . sodium chloride flush  3 mL Intravenous Q12H  . zinc sulfate  220 mg Oral Daily   Continuous Infusions: . remdesivir 100 mg in NS 100 mL 100 mg (02/21/20 1125)     LOS: 2 days    Time spent: 38 minutes spent on chart review, discussion with nursing staff, consultants, updating family and interview/physical exam; more than 50% of that time was spent in counseling and/or coordination of  care.    Tanja Gift J [place] DO [facility] Available via Epic secure chat 7am-7pm After these hours, please refer to coverage provider listed on [email or web address] 02/21/2020, 11:26 AM

## 2020-02-21 NOTE — ED Notes (Signed)
Pt eat 25% of his breakfast that all he can eat.

## 2020-02-21 NOTE — Progress Notes (Signed)
Wound care complete. Noted upon arrival patient's sacral wound had wound vac sponge and air tight dressing but no suction apparatus attached. Patient's equipment at home. Removed sponge, cleaned and measured wound. Packed w/ NS gauze and covered w/ ABD and paper tape. Patient tolerated well.

## 2020-02-21 NOTE — ED Notes (Signed)
Pt eat 50% lunch and dinner that all he wanted to eat. I had to feed him all three meals.

## 2020-02-22 LAB — COMPREHENSIVE METABOLIC PANEL
ALT: 22 U/L (ref 0–44)
AST: 31 U/L (ref 15–41)
Albumin: 3.6 g/dL (ref 3.5–5.0)
Alkaline Phosphatase: 57 U/L (ref 38–126)
Anion gap: 11 (ref 5–15)
BUN: 29 mg/dL — ABNORMAL HIGH (ref 8–23)
CO2: 28 mmol/L (ref 22–32)
Calcium: 8.8 mg/dL — ABNORMAL LOW (ref 8.9–10.3)
Chloride: 102 mmol/L (ref 98–111)
Creatinine, Ser: 0.81 mg/dL (ref 0.61–1.24)
GFR calc Af Amer: >60 mL/min (ref >60)
GFR calc non Af Amer: >60 mL/min (ref >60)
Glucose, Bld: 106 mg/dL — ABNORMAL HIGH (ref 70–99)
Potassium: 4.5 mmol/L (ref 3.5–5.1)
Sodium: 141 mmol/L (ref 135–145)
Total Bilirubin: 0.6 mg/dL (ref 0.3–1.2)
Total Protein: 7 g/dL (ref 6.5–8.1)

## 2020-02-22 LAB — CBC WITH DIFFERENTIAL/PLATELET
Abs Immature Granulocytes: 0.02 10*3/uL (ref 0.00–0.07)
Basophils Absolute: 0 10*3/uL (ref 0.0–0.1)
Basophils Relative: 0 %
Eosinophils Absolute: 0 10*3/uL (ref 0.0–0.5)
Eosinophils Relative: 0 %
HCT: 42.2 % (ref 39.0–52.0)
Hemoglobin: 13.3 g/dL (ref 13.0–17.0)
Immature Granulocytes: 0 %
Lymphocytes Relative: 15 %
Lymphs Abs: 1 10*3/uL (ref 0.7–4.0)
MCH: 27.1 pg (ref 26.0–34.0)
MCHC: 31.5 g/dL (ref 30.0–36.0)
MCV: 85.9 fL (ref 80.0–100.0)
Monocytes Absolute: 0.6 10*3/uL (ref 0.1–1.0)
Monocytes Relative: 9 %
Neutro Abs: 5.1 10*3/uL (ref 1.7–7.7)
Neutrophils Relative %: 76 %
Platelets: 214 10*3/uL (ref 150–400)
RBC: 4.91 MIL/uL (ref 4.22–5.81)
RDW: 15.3 % (ref 11.5–15.5)
WBC: 6.8 10*3/uL (ref 4.0–10.5)
nRBC: 0 % (ref 0.0–0.2)

## 2020-02-22 LAB — C-REACTIVE PROTEIN: CRP: 2.3 mg/dL — ABNORMAL HIGH (ref <1.0)

## 2020-02-22 LAB — GLUCOSE, CAPILLARY
Glucose-Capillary: 89 mg/dL (ref 70–99)
Glucose-Capillary: 91 mg/dL (ref 70–99)
Glucose-Capillary: 98 mg/dL (ref 70–99)
Glucose-Capillary: 119 mg/dL — ABNORMAL HIGH (ref 70–99)

## 2020-02-22 LAB — PHOSPHORUS: Phosphorus: 3.8 mg/dL (ref 2.5–4.6)

## 2020-02-22 LAB — MAGNESIUM: Magnesium: 2.5 mg/dL — ABNORMAL HIGH (ref 1.7–2.4)

## 2020-02-22 LAB — D-DIMER, QUANTITATIVE: D-Dimer, Quant: 0.52 ug/mL-FEU — ABNORMAL HIGH (ref 0.00–0.50)

## 2020-02-22 LAB — FERRITIN: Ferritin: 100 ng/mL (ref 24–336)

## 2020-02-22 MED ORDER — METHYLPREDNISOLONE SODIUM SUCC 40 MG IJ SOLR: 40.0000 mg | Freq: Two times a day (BID) | INTRAMUSCULAR | Status: DC

## 2020-02-22 MED ORDER — METHYLPREDNISOLONE SODIUM SUCC 40 MG IJ SOLR
40.0000 mg | Freq: Two times a day (BID) | INTRAMUSCULAR | Status: DC
  Administered 2020-02-22 – 2020-02-24 (×4): 40 mg via INTRAVENOUS
  Filled 2020-02-22 (×4): qty 1

## 2020-02-22 MED ORDER — OXYMETAZOLINE HCL 0.05 % NA SOLN
1.0000 | Freq: Two times a day (BID) | NASAL | Status: DC
  Administered 2020-02-22 – 2020-02-24 (×4): 1 via NASAL
  Filled 2020-02-22 (×2): qty 15

## 2020-02-22 MED ORDER — LATANOPROST 0.005 % OP SOLN
1.0000 [drp] | Freq: Every day | OPHTHALMIC | Status: DC
  Administered 2020-02-22 – 2020-02-23 (×2): 1 [drp] via OPHTHALMIC
  Filled 2020-02-22: qty 2.5

## 2020-02-22 NOTE — Progress Notes (Signed)
CPAP device ready for patient who now states that he wants his home mask taped.  I explained to patient that we were not allowed to "modify" home equipment in any way.  Spoke to RN and asked to to give tape to patient so that he can revise mask to his liking.

## 2020-02-22 NOTE — Progress Notes (Signed)
PROGRESS NOTE    [name]  XTG:[id number] DOB: 1941/02/23 DOA: 02/19/2020 PCP: [name], MD    Brief Narrative:  [name]  is a 79 y.o. male with medical history significant for COPD/chronic respiratory failure with hypoxia on 2 L supplemental O2 via [place] at home, chronic diastolic CHF (last EF 27-03% by TTE 09/18/2019), T2DM, HTN, HLD, hypothyroidism, PVD, thoracic ascending aortic aneurysm, CAS s/p left CEA, neurogenic bladder with chronic indwelling Foley catheter, spinal stenosis with chronic pain and nonambulatory status, sacral wound with wound VAC in place, OSA on CPAP who presents to the ED for evaluation of fevers, dyspnea, diaphoresis, cough, and malaise.  Patient states he is chronically bedbound and nonambulatory.  2 days ago he began to have new shortness of breath and cough productive of white sputum which worsened significantly last night.  He has been having associated fevers, chills, diaphoresis.  He has been feeling dehydrated and says he has not been eating and drinking as well the last 2 days.  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  He has chronic indwelling Foley catheter in place and has not noticed any significant change in urine output or color.  He says he did not have significant improvement in symptoms with home oxygen or inhalers therefore presented to the ED for further evaluation management.  ED Course:  BP 85/56, pulse 95, RR 22, temp 100.2 Fahrenheit, SPO2 97% on NRB 10 L O2.  O2 was weaned to 4 L supplemental O2 via [place] with saturations >95%. Labs notable for WBC 7.7, hemoglobin 13.0, platelets 219,000, sodium 137, potassium 3.9, bicarb 27, BUN 40, creatinine 0.95, LFTs within normal limits, lactic acid 1.1. Urinalysis showed positive nitrates, large leukocytes, 6-10 RBC/hpf, >50 WBC/hpf, many bacteria microscopy.  Urine and blood cultures were obtained and pending. SARS-CoV-2 PCR is positive. Portable chest x-ray shows right upper  lobe pulmonary infiltrate. Patient was given IV vancomycin, cefepime, Flagyl, 3 L LR.  The hospitalist service was consulted to admit for further evaluation and management.   Assessment & Plan:   Principal Problem:   Acute hypoxemic respiratory failure due to COVID-19 [facility]) Active Problems:   OSA on CPAP   Severe sepsis (HCC)   Hypertension associated with diabetes (HCC)   Chronic diastolic CHF (congestive heart failure) (HCC)   Diabetes mellitus type 2, controlled, with complications ([place])   Neurogenic bladder   Hypothyroidism   Hyperlipidemia associated with type 2 diabetes mellitus (HCC)   Chronic indwelling Foley catheter   COPD (chronic obstructive pulmonary disease) ([place])   Pneumonia due to COVID-19 virus   Severe sepsis, present on admission Acute on chronic hypoxic respiratory failure secondary to acute Covid-19 viral pneumonia during the ongoing 2020 Covid 19 Pandemic - POA CXR with right upper lobe infiltrate.  Has increased O2 requirement from home 2 L to 4 L O2 via [place].  Met SIRS criteria with tachypnea and RR 22, tachycardia to 96.  Was hypotensive with SBP 85 which improved after aggressive 3L IV fluid resuscitation in the ED. --COVID test: + PCR 9/2 --CRP 10.5>11.6>6.8>2.3 --ddimer 0.78>0.82>0.49>0.52 --Remdesivir, plan 5-day course (Day #4/5) --Decrease Solumedrol to 35m IV q12h --prone for 2-3hrs every 12hrs if able --Continue supplemental oxygen, titrate to maintain SPO2 greater than 92%, currently on 4 L nasal cannula with SPO2 96% at rest (on 2L [place] baseline) --Continue supportive care with albuterol MDI prn, vitamin C, zinc, Tylenol, antitussives (benzonatate/ Mucinex/Tussionex) --Incentive spirometry/flutter valve --Follow CBC, CMP, D-dimer,  ferritin, and CRP daily --Continue airborne/contact isolation precautions for 3 weeks from the day of diagnosis  The treatment plan and use of medications and known side effects were discussed with patient/family. Some of  the medications used are based on case reports/anecdotal data.  All other medications being used in the management of COVID-19 based on limited study data.  Complete risks and long-term side effects are unknown, however in the best clinical judgment they seem to be of some benefit.  Patient wanted to proceed with treatment options provided.  COPD on 2L  baseline Without acute exacerbation.  --Continue Trelegy Ellipta inhaler with hospital substitution  --Continue management as above.  Chronic diastolic CHF: Volume depleted on arrival requiring aggressive fluid resuscitation in the ED.  --Monitor strict I/O's and resume home Lasix as able.  Essential ypertension: On furosemide 40 mg p.o. daily, metoprolol succinate 25 mg p.o. daily, spironolactone 25 mg p.o. daily. --Hold home antihypertensives due to hypotension on arrival.  --Continue monitor BP and resume home meds as able.  Diet controlled type 2 diabetes: --CBGs before every meal/at bedtime --Insulin sliding scale for coverage  Neurogenic bladder with chronic indwelling Foley catheter: Urinalysis abnormal in setting of chronic indwelling Foley catheter, possibly colonized without active UTI.  Hold further antibiotics, follow urine culture. Continue Foley care.  Sacral pressure wound, present on admission: Lower extremity pressure wounds, present on admission Wound VAC in place.  Follows with wound center. --Wound care consultation --Prevalon boots  Hypothyroidism: Continue Synthroid 125 mcg p.o. daily.  Hyperlipidemia: Continue atorvastatin 20 mg p.o. daily.  Chronic pain syndrome/spinal stenosis/spasticity/RLS: Continue home Norco as needed with hold parameters, Robaxin, gabapentin, baclofen, Sinemet.  OSA: Continue CPAP nightly.  DVT prophylaxis: Lovenox Code Status: DNR Family Communication: Updated patient extensively at bedside, updated patient's spouse [name] via telephone this morning  Disposition  Plan:  Status is: Inpatient  Remains inpatient appropriate because:Ongoing diagnostic testing needed not appropriate for outpatient work up, Unsafe d/c plan, IV treatments appropriate due to intensity of illness or inability to take PO and Inpatient level of care appropriate due to severity of illness   Dispo: The patient is from: Home              Anticipated d/c is to: Home              Anticipated d/c date is: 3 days              Patient currently is not medically stable to d/c.   Consultants:   None  Procedures:   None  Antimicrobials:   Remdesivir 9/2>>   Subjective: Patient seen and examined bedside, resting comfortably. Requesting when his breakfast will be available. Continues with mild shortness of breath, improving daily.  Wants to make sure that his Robaxin is being administered every 6 hours.  No other complaints or concerns at this time. Continues on 4 L nasal cannula with home baseline 2 L. Denies headache, no dizziness, no chest pain, no palpitations, no abdominal pain, no chills/night sweats, no nausea/vomiting/diarrhea, no cough. No acute events overnight per nursing staff.  Objective: Vitals:   02/21/20 1928 02/21/20 2327 02/22/20 0339 02/22/20 0806  BP: 116/71 131/73 132/74 133/79  Pulse: 79 65 63 63  Resp: '20 18 16 16  ' Temp: 99.1 F (37.3 C) (!) 97.5 F (36.4 C) 97.7 F (36.5 C) 98.5 F (36.9 C)  TempSrc: Oral Oral Oral Oral  SpO2: 96% 94% 96% 95%  Weight:      Height:  Intake/Output Summary (Last 24 hours) at 02/22/2020 1100 Last data filed at 02/22/2020 0838 Gross per 24 hour  Intake 201.01 ml  Output 2151 ml  Net -1949.99 ml   Filed Weights   02/19/20 1921  Weight: 90.7 kg    Examination:  General exam: Appears calm and comfortable; chronically ill in appearance Respiratory system: Breath sounds slightly decreased posterior bases, otherwise clear throughout, normal respiratory effort without accessory muscle use, on 4 L nasal  cannula (baseline 2L) Cardiovascular system: S1 & S2 heard, RRR. No JVD, murmurs, rubs, gallops or clicks. No pedal edema. Gastrointestinal system: Abdomen is nondistended, soft and nontender. No organomegaly or masses felt. Normal bowel sounds heard. Central nervous system: Alert and oriented. No focal neurological deficits. Extremities: Moves all extremities independently Skin: Wound VAC in place to sacral pressure ulcer noted Psychiatry: Judgement and insight appear normal. Mood & affect appropriate.     Data Reviewed: I have personally reviewed following labs and imaging studies  CBC: Recent Labs  Lab 02/19/20 1949 02/19/20 2212 02/20/20 0700 02/21/20 0514 02/22/20 0454  WBC 7.7 7.8 6.0 5.3 6.8  NEUTROABS 5.6 5.6 4.8 3.8 5.1  HGB 13.0 11.8* 11.7* 13.4 13.3  HCT 40.2 36.7* 35.9* 42.3 42.2  MCV 84.8 84.6 85.3 84.8 85.9  PLT 219 189 188 227 035   Basic Metabolic Panel: Recent Labs  Lab 02/19/20 1949 02/20/20 0529 02/21/20 0514 02/22/20 0454  NA 137 140 142 141  K 3.9 3.9 4.2 4.5  CL 99 102 104 102  CO2 '27 27 27 28  ' GLUCOSE 98 125* 116* 106*  BUN 40* 26* 27* 29*  CREATININE 0.95 0.73 0.71 0.81  CALCIUM 8.6* 8.4* 8.8* 8.8*  MG  --  2.0 2.6* 2.5*  PHOS  --  3.3 4.0 3.8   GFR: Estimated Creatinine Clearance: 80.7 mL/min (by C-G formula based on SCr of 0.81 mg/dL). Liver Function Tests: Recent Labs  Lab 02/19/20 1949 02/20/20 0529 02/21/20 0514 02/22/20 0454  AST '23 17 18 31  ' ALT '10 12 13 22  ' ALKPHOS 68 62 59 57  BILITOT 0.6 0.5 0.4 0.6  PROT 7.2 6.8 6.9 7.0  ALBUMIN 3.8 3.5 3.5 3.6   No results for input(s): LIPASE, AMYLASE in the last 168 hours. No results for input(s): AMMONIA in the last 168 hours. Coagulation Profile: Recent Labs  Lab 02/19/20 1949  INR 1.1   Cardiac Enzymes: No results for input(s): CKTOTAL, CKMB, CKMBINDEX, TROPONINI in the last 168 hours. BNP (last 3 results) No results for input(s): PROBNP in the last 8760  hours. HbA1C: Recent Labs    02/19/20 2212  HGBA1C 5.6   CBG: Recent Labs  Lab 02/21/20 1159 02/21/20 1725 02/21/20 1727 02/21/20 2008 02/22/20 0801  GLUCAP 110* 88 106* 113* 89   Lipid Profile: Recent Labs    02/19/20 2212  TRIG 238*   Thyroid Function Tests: No results for input(s): TSH, T4TOTAL, FREET4, T3FREE, THYROIDAB in the last 72 hours. Anemia Panel: Recent Labs    02/21/20 0514 02/22/20 0454  FERRITIN 110 100   Sepsis Labs: Recent Labs  Lab 02/19/20 1949 02/19/20 2212 02/19/20 2218  PROCALCITON  --  <0.10  --   LATICACIDVEN 1.1  --  1.2    Recent Results (from the past 240 hour(s))  Blood Culture (routine x 2)     Status: None (Preliminary result)   Collection Time: 02/19/20  7:43 PM   Specimen: BLOOD  Result Value Ref Range Status   Specimen Description  Final    BLOOD BLOOD LEFT FOREARM Performed at [facility] [address] [place], [place] [address]    Special Requests   Final    BOTTLES DRAWN AEROBIC AND ANAEROBIC Blood Culture results may not be optimal due to an inadequate volume of blood received in culture bottles Performed at [facility] [address], [place], [place] [address]    Culture   Final    NO GROWTH 3 DAYS Performed at [facility], [place] [address], [place], [place] [address]    Report Status PENDING  Incomplete  Urine culture     Status: Abnormal   Collection Time: 02/19/20  7:43 PM   Specimen: In/Out Cath Urine  Result Value Ref Range Status   Specimen Description   Final    IN/OUT CATH URINE Performed at [facility] [address], [place], [place] [address]    Special Requests   Final    NONE Performed at [facility], [place] [address] [place], [place] [address]    Culture >=100,000 COLONIES/mL KLEBSIELLA PNEUMONIAE (A)  Final   Report Status 02/21/2020 FINAL  Final   Organism ID, Bacteria KLEBSIELLA PNEUMONIAE (A)   Final      Susceptibility   Klebsiella pneumoniae - MIC*    AMPICILLIN RESISTANT Resistant     CEFAZOLIN <=4 SENSITIVE Sensitive     CEFTRIAXONE <=0.25 SENSITIVE Sensitive     CIPROFLOXACIN <=0.25 SENSITIVE Sensitive     GENTAMICIN <=1 SENSITIVE Sensitive     IMIPENEM <=0.25 SENSITIVE Sensitive     NITROFURANTOIN 32 SENSITIVE Sensitive     TRIMETH/SULFA <=20 SENSITIVE Sensitive     AMPICILLIN/SULBACTAM 4 SENSITIVE Sensitive     PIP/TAZO 8 SENSITIVE Sensitive     * >=100,000 COLONIES/mL KLEBSIELLA PNEUMONIAE  Blood Culture (routine x 2)     Status: None (Preliminary result)   Collection Time: 02/19/20  7:48 PM   Specimen: BLOOD  Result Value Ref Range Status   Specimen Description   Final    BLOOD BLOOD RIGHT FOREARM Performed at [facility] [address], [place],  [address]    Special Requests   Final    BOTTLES DRAWN AEROBIC AND ANAEROBIC Blood Culture adequate volume Performed at [facility] [address], [place],  [address]    Culture   Final    NO GROWTH 3 DAYS Performed at [facility], [place] [address], [place],  [address]    Report Status PENDING  Incomplete  SARS Coronavirus 2 by RT PCR (hospital order, performed in [facility] hospital lab) Nasopharyngeal Nasopharyngeal Swab     Status: Abnormal   Collection Time: 02/19/20  8:22 PM   Specimen: Nasopharyngeal Swab  Result Value Ref Range Status   SARS Coronavirus 2 POSITIVE (A) NEGATIVE Final    Comment: RESULT CALLED TO, READ BACK BY AND VERIFIED WITH: ILENE HODGES @ 2132 ON 02/19/20 C VARNER (NOTE) SARS-CoV-2 target nucleic acids are DETECTED  SARS-CoV-2 RNA is generally detectable in upper respiratory specimens  during the acute phase of infection.  Positive results are indicative  of the presence of the identified virus, but do not rule out bacterial infection or co-infection with other pathogens not detected by the test.  Clinical  correlation with patient history and  other diagnostic information is necessary to determine patient infection status.  The expected result is negative.  Fact Sheet for Patients:   StrictlyIdeas.no  Fact Sheet for Healthcare Providers:   BankingDealers.co.za    This test is not yet approved or cleared by the [place] FDA and  has been authorized for detection and/or diagnosis of SARS-CoV-2 by FDA under an Emergency Use Authorization (EUA).  This EUA will remain in effect (meaning thi s test can be used) for the duration of  the COVID-19 declaration under Section 564(b)(1) of the Act, 21 U.S.C. section 360-bbb-3(b)(1), unless the authorization is terminated or revoked sooner.  Performed at [facility], [place] [address] [place], [place] [address]          Radiology Studies: No results found.      Scheduled Meds: . vitamin C  500 mg Oral Daily  . atorvastatin  20 mg Oral q1800  . baclofen  40 mg Oral QID  . carbidopa-levodopa  1 tablet Oral BID PC  . Chlorhexidine Gluconate Cloth  6 each Topical Daily  . docusate sodium  100 mg Oral BID  . enoxaparin (LOVENOX) injection  40 mg Subcutaneous Q24H  . gabapentin  300 mg Oral QHS  . guaiFENesin  1,200 mg Oral BID  . insulin aspart  0-5 Units Subcutaneous QHS  . insulin aspart  0-9 Units Subcutaneous TID WC  . latanoprost  1 drop Both Eyes QHS  . levothyroxine  125 mcg Oral Q0600  . mouth rinse  15 mL Mouth Rinse BID  . methocarbamol  750 mg Oral Q6H  . methylPREDNISolone (SOLU-MEDROL) injection  40 mg Intravenous Q8H  . mometasone-formoterol  2 puff Inhalation BID   And  . umeclidinium bromide  1 puff Inhalation Daily  . oxymetazoline  1 spray Each Nare BID  . sodium chloride flush  3 mL Intravenous Q12H  . zinc sulfate  220 mg Oral Daily   Continuous Infusions: . remdesivir 100 mg in NS 100 mL 100 mg (02/22/20 1039)     LOS: 3 days    Time  spent: 36 minutes spent on chart review, discussion with nursing staff, consultants, updating family and interview/physical exam; more than 50% of that time was spent in counseling and/or coordination of care.    Trentan Trippe J [place] DO [facility] Available via Epic secure chat 7am-7pm After these hours, please refer to coverage provider listed on [email or web address] 02/22/2020, 11:00 AM

## 2020-02-23 LAB — COMPREHENSIVE METABOLIC PANEL
ALT: 26 U/L (ref 0–44)
AST: 24 U/L (ref 15–41)
Albumin: 3.4 g/dL — ABNORMAL LOW (ref 3.5–5.0)
Alkaline Phosphatase: 59 U/L (ref 38–126)
Anion gap: 12 (ref 5–15)
BUN: 28 mg/dL — ABNORMAL HIGH (ref 8–23)
CO2: 28 mmol/L (ref 22–32)
Calcium: 8.7 mg/dL — ABNORMAL LOW (ref 8.9–10.3)
Chloride: 101 mmol/L (ref 98–111)
Creatinine, Ser: 0.71 mg/dL (ref 0.61–1.24)
GFR calc Af Amer: >60 mL/min (ref >60)
GFR calc non Af Amer: >60 mL/min (ref >60)
Glucose, Bld: 100 mg/dL — ABNORMAL HIGH (ref 70–99)
Potassium: 4.2 mmol/L (ref 3.5–5.1)
Sodium: 141 mmol/L (ref 135–145)
Total Bilirubin: 0.7 mg/dL (ref 0.3–1.2)
Total Protein: 6.4 g/dL — ABNORMAL LOW (ref 6.5–8.1)

## 2020-02-23 LAB — CBC WITH DIFFERENTIAL/PLATELET
Abs Immature Granulocytes: 0.02 10*3/uL (ref 0.00–0.07)
Basophils Absolute: 0 10*3/uL (ref 0.0–0.1)
Basophils Relative: 0 %
Eosinophils Absolute: 0 10*3/uL (ref 0.0–0.5)
Eosinophils Relative: 0 %
HCT: 38.6 % — ABNORMAL LOW (ref 39.0–52.0)
Hemoglobin: 12.3 g/dL — ABNORMAL LOW (ref 13.0–17.0)
Immature Granulocytes: 0 %
Lymphocytes Relative: 12 %
Lymphs Abs: 0.8 10*3/uL (ref 0.7–4.0)
MCH: 27.3 pg (ref 26.0–34.0)
MCHC: 31.9 g/dL (ref 30.0–36.0)
MCV: 85.8 fL (ref 80.0–100.0)
Monocytes Absolute: 0.6 10*3/uL (ref 0.1–1.0)
Monocytes Relative: 9 %
Neutro Abs: 5.5 10*3/uL (ref 1.7–7.7)
Neutrophils Relative %: 79 %
Platelets: 186 10*3/uL (ref 150–400)
RBC: 4.5 MIL/uL (ref 4.22–5.81)
RDW: 15.4 % (ref 11.5–15.5)
WBC: 7 10*3/uL (ref 4.0–10.5)
nRBC: 0 % (ref 0.0–0.2)

## 2020-02-23 LAB — GLUCOSE, CAPILLARY
Glucose-Capillary: 82 mg/dL (ref 70–99)
Glucose-Capillary: 100 mg/dL — ABNORMAL HIGH (ref 70–99)
Glucose-Capillary: 109 mg/dL — ABNORMAL HIGH (ref 70–99)
Glucose-Capillary: 111 mg/dL — ABNORMAL HIGH (ref 70–99)

## 2020-02-23 LAB — D-DIMER, QUANTITATIVE: D-Dimer, Quant: 0.27 ug/mL-FEU (ref 0.00–0.50)

## 2020-02-23 LAB — FERRITIN: Ferritin: 59 ng/mL (ref 24–336)

## 2020-02-23 LAB — C-REACTIVE PROTEIN: CRP: 1.5 mg/dL — ABNORMAL HIGH (ref <1.0)

## 2020-02-23 LAB — MAGNESIUM: Magnesium: 2.3 mg/dL (ref 1.7–2.4)

## 2020-02-23 LAB — PHOSPHORUS: Phosphorus: 3.1 mg/dL (ref 2.5–4.6)

## 2020-02-23 NOTE — Progress Notes (Signed)
PROGRESS NOTE    [name]  XTA:[id number] DOB: July 09, 1940 DOA: 02/19/2020 PCP: [name], MD    Brief Narrative:  [name]  is a 79 y.o. male with medical history significant for COPD/chronic respiratory failure with hypoxia on 2 L supplemental O2 via [place] at home, chronic diastolic CHF (last EF 65-53% by TTE 09/18/2019), T2DM, HTN, HLD, hypothyroidism, PVD, thoracic ascending aortic aneurysm, CAS s/p left CEA, neurogenic bladder with chronic indwelling Foley catheter, spinal stenosis with chronic pain and nonambulatory status, sacral wound with wound VAC in place, OSA on CPAP who presents to the ED for evaluation of fevers, dyspnea, diaphoresis, cough, and malaise.  Patient states he is chronically bedbound and nonambulatory.  2 days ago he began to have new shortness of breath and cough productive of white sputum which worsened significantly last night.  He has been having associated fevers, chills, diaphoresis.  He has been feeling dehydrated and says he has not been eating and drinking as well the last 2 days.  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  He has chronic indwelling Foley catheter in place and has not noticed any significant change in urine output or color.  He says he did not have significant improvement in symptoms with home oxygen or inhalers therefore presented to the ED for further evaluation management.  ED Course:  BP 85/56, pulse 95, RR 22, temp 100.2 Fahrenheit, SPO2 97% on NRB 10 L O2.  O2 was weaned to 4 L supplemental O2 via [place] with saturations >95%. Labs notable for WBC 7.7, hemoglobin 13.0, platelets 219,000, sodium 137, potassium 3.9, bicarb 27, BUN 40, creatinine 0.95, LFTs within normal limits, lactic acid 1.1. Urinalysis showed positive nitrates, large leukocytes, 6-10 RBC/hpf, >50 WBC/hpf, many bacteria microscopy.  Urine and blood cultures were obtained and pending. SARS-CoV-2 PCR is positive. Portable chest x-ray shows right upper  lobe pulmonary infiltrate. Patient was given IV vancomycin, cefepime, Flagyl, 3 L LR.  The hospitalist service was consulted to admit for further evaluation and management.   Assessment & Plan:   Principal Problem:   Acute hypoxemic respiratory failure due to COVID-19 [facility]) Active Problems:   OSA on CPAP   Severe sepsis (HCC)   Hypertension associated with diabetes (HCC)   Chronic diastolic CHF (congestive heart failure) (HCC)   Diabetes mellitus type 2, controlled, with complications ([place])   Neurogenic bladder   Hypothyroidism   Hyperlipidemia associated with type 2 diabetes mellitus (HCC)   Chronic indwelling Foley catheter   COPD (chronic obstructive pulmonary disease) ([place])   Pneumonia due to COVID-19 virus   Severe sepsis, present on admission Acute on chronic hypoxic respiratory failure secondary to acute Covid-19 viral pneumonia during the ongoing 2020 Covid 19 Pandemic - POA CXR with right upper lobe infiltrate.  Has increased O2 requirement from home 2 L to 4 L O2 via [place].  Met SIRS criteria with tachypnea and RR 22, tachycardia to 96.  Was hypotensive with SBP 85 which improved after aggressive 3L IV fluid resuscitation in the ED. --COVID test: + PCR 9/2 --CRP 10.5>11.6>6.8>2.3>1.5 --ddimer 0.78>0.82>0.49>0.52>0.27 --Remdesivir, plan 5-day course (Day #5/5) --Decrease Solumedrol to 46m IV q24h --prone for 2-3hrs every 12hrs if able --Continue supplemental oxygen, titrate to maintain SPO2 greater than 92%, currently on room air with SPO2 89% at rest (on 2L  baseline) --Continue supportive care with albuterol MDI prn, vitamin C, zinc, Tylenol, antitussives (benzonatate/ Mucinex/Tussionex) --Incentive spirometry/flutter valve --Follow CBC, CMP, D-dimer, ferritin, and  CRP daily --Continue airborne/contact isolation precautions for 3 weeks from the day of diagnosis  The treatment plan and use of medications and known side effects were discussed with patient/family. Some of  the medications used are based on case reports/anecdotal data.  All other medications being used in the management of COVID-19 based on limited study data.  Complete risks and long-term side effects are unknown, however in the best clinical judgment they seem to be of some benefit.  Patient wanted to proceed with treatment options provided.  COPD on 2L [place] baseline Without acute exacerbation.  --Continue Trelegy Ellipta inhaler with hospital substitution  --Continue management as above.  Chronic diastolic CHF: Volume depleted on arrival requiring aggressive fluid resuscitation in the ED.  --Monitor strict I/O's and resume home Lasix as able.  Essential Hypertension: On furosemide 40 mg p.o. daily, metoprolol succinate 25 mg p.o. daily, spironolactone 25 mg p.o. daily. --Hold home antihypertensives due to hypotension on arrival.  --Continue monitor BP and resume home meds as able.  Diet controlled type 2 diabetes: --CBGs before every meal/at bedtime --Insulin sliding scale for coverage  Neurogenic bladder with chronic indwelling Foley catheter: Urinalysis abnormal in setting of chronic indwelling Foley catheter, possibly colonized without active UTI.  Hold further antibiotics, urine culture with Klebsiella pneumonia, asymptomatic without fever or elevated WBC count; likely colonization. Continue Foley care.  Sacral pressure wound, present on admission: Lower extremity pressure wounds, present on admission Wound VAC in place.  Follows with wound center. --Wound care consultation --Prevalon boots  Hypothyroidism: Continue Synthroid 125 mcg p.o. daily.  Hyperlipidemia: Continue atorvastatin 20 mg p.o. daily.  Chronic pain syndrome/spinal stenosis/spasticity/RLS: Continue home Norco as needed with hold parameters, Robaxin, gabapentin, baclofen, Sinemet.  OSA: Continue CPAP nightly.  DVT prophylaxis: Lovenox Code Status: DNR Family Communication: Updated patient  extensively at bedside, updated patient's spouse [name] via telephone this morning  Disposition Plan:  Status is: Inpatient  Remains inpatient appropriate because:Ongoing diagnostic testing needed not appropriate for outpatient work up, Unsafe d/c plan, IV treatments appropriate due to intensity of illness or inability to take PO and Inpatient level of care appropriate due to severity of illness   Dispo: The patient is from: Home              Anticipated d/c is to: Home              Anticipated d/c date is: 1 day              Patient currently is not medically stable to d/c.   Consultants:   None  Procedures:   None  Antimicrobials:   Remdesivir 9/2>>   Subjective: Patient seen and examined bedside, resting comfortably.  Breathing improved.  Titrated off supplemental oxygen this morning although on home oxygen with a baseline 2 L. Denies headache, no dizziness, no chest pain, no palpitations, no abdominal pain, no chills/night sweats, no nausea/vomiting/diarrhea, no cough. No acute events overnight per nursing staff.  Objective: Vitals:   02/22/20 0806 02/22/20 1425 02/22/20 2031 02/23/20 0435  BP: 133/79 138/74 124/69 (!) 148/76  Pulse: 63 70 70 66  Resp: _0 Temp: 98.5 F (36.9 C) 99.3 F (37.4 C) 98.5 F (36.9 C) 97.7 F (36.5 C)  TempSrc: Oral Oral Oral Oral  SpO2: 95% 96% 96% 98%  Weight:      Height:        Intake/Output Summary (Last 24 hours) at 02/23/2020 1117 Last data filed at 02/23/2020 1024 Gross  per 24 hour  Intake 572 ml  Output 2103 ml  Net -1531 ml   Filed Weights   02/19/20 1921  Weight: 90.7 kg    Examination:  General exam: Appears calm and comfortable; chronically ill in appearance Respiratory system: Breath sounds slightly decreased posterior bases, otherwise clear throughout, normal respiratory effort without accessory muscle use, on room air (SpO2 89% at rest) (baseline 2L) Cardiovascular system: S1 & S2 heard, RRR. No JVD,  murmurs, rubs, gallops or clicks. No pedal edema. Gastrointestinal system: Abdomen is nondistended, soft and nontender. No organomegaly or masses felt. Normal bowel sounds heard. Central nervous system: Alert and oriented. No focal neurological deficits. Extremities: Moves all extremities independently Skin: Wound VAC in place to sacral pressure ulcer noted Psychiatry: Judgement and insight appear normal. Mood & affect appropriate.     Data Reviewed: I have personally reviewed following labs and imaging studies  CBC: Recent Labs  Lab 02/19/20 2212 02/20/20 0700 02/21/20 0514 02/22/20 0454 02/23/20 0507  WBC 7.8 6.0 5.3 6.8 7.0  NEUTROABS 5.6 4.8 3.8 5.1 5.5  HGB 11.8* 11.7* 13.4 13.3 12.3*  HCT 36.7* 35.9* 42.3 42.2 38.6*  MCV 84.6 85.3 84.8 85.9 85.8  PLT 189 188 227 214 902   Basic Metabolic Panel: Recent Labs  Lab 02/19/20 1949 02/20/20 0529 02/21/20 0514 02/22/20 0454 02/23/20 0507  NA 137 140 142 141 141  K 3.9 3.9 4.2 4.5 4.2  CL 99 102 104 102 101  CO2 _0 GLUCOSE 98 125* 116* 106* 100*  BUN 40* 26* 27* 29* 28*  CREATININE 0.95 0.73 0.71 0.81 0.71  CALCIUM 8.6* 8.4* 8.8* 8.8* 8.7*  MG  --  2.0 2.6* 2.5* 2.3  PHOS  --  3.3 4.0 3.8 3.1   GFR: Estimated Creatinine Clearance: 81.7 mL/min (by C-G formula based on SCr of 0.71 mg/dL). Liver Function Tests: Recent Labs  Lab 02/19/20 1949 02/20/20 0529 02/21/20 0514 02/22/20 0454 02/23/20 0507  AST _1 ALT _2 ALKPHOS 68 62 59 57 59  BILITOT 0.6 0.5 0.4 0.6 0.7  PROT 7.2 6.8 6.9 7.0 6.4*  ALBUMIN 3.8 3.5 3.5 3.6 3.4*   No results for input(s): LIPASE, AMYLASE in the last 168 hours. No results for input(s): AMMONIA in the last 168 hours. Coagulation Profile: Recent Labs  Lab 02/19/20 1949  INR 1.1   Cardiac Enzymes: No results for input(s): CKTOTAL, CKMB, CKMBINDEX, TROPONINI in the last 168 hours. BNP (last 3 results) No results for input(s): PROBNP in the last  8760 hours. HbA1C: No results for input(s): HGBA1C in the last 72 hours. CBG: Recent Labs  Lab 02/22/20 0801 02/22/20 1118 02/22/20 1749 02/22/20 2028 02/23/20 0752  GLUCAP 89 91 98 119* 82   Lipid Profile: No results for input(s): CHOL, HDL, LDLCALC, TRIG, CHOLHDL, LDLDIRECT in the last 72 hours. Thyroid Function Tests: No results for input(s): TSH, T4TOTAL, FREET4, T3FREE, THYROIDAB in the last 72 hours. Anemia Panel: Recent Labs    02/22/20 0454 02/23/20 0507  FERRITIN 100 59   Sepsis Labs: Recent Labs  Lab 02/19/20 1949 02/19/20 2212 02/19/20 2218  PROCALCITON  --  <0.10  --   LATICACIDVEN 1.1  --  1.2    Recent Results (from the past 240 hour(s))  Blood Culture (routine x 2)     Status: None (Preliminary result)   Collection Time: 02/19/20  7:43 PM   Specimen: BLOOD  Result Value  Ref Range Status   Specimen Description   Final    BLOOD BLOOD LEFT FOREARM Performed at [facility] [address] [place], [place] [address]    Special Requests   Final    BOTTLES DRAWN AEROBIC AND ANAEROBIC Blood Culture results may not be optimal due to an inadequate volume of blood received in culture bottles Performed at [facility] [address], [place], [place] [address]    Culture   Final    NO GROWTH 4 DAYS Performed at [facility], [place] [address], [place], [place] [address]    Report Status PENDING  Incomplete  Urine culture     Status: Abnormal   Collection Time: 02/19/20  7:43 PM   Specimen: In/Out Cath Urine  Result Value Ref Range Status   Specimen Description   Final    IN/OUT CATH URINE Performed at [facility] [address], [place], [place] [address]    Special Requests   Final    NONE Performed at [facility], [place] [address], [place], [place] [address]    Culture >=100,000 COLONIES/mL KLEBSIELLA PNEUMONIAE (A)  Final   Report Status 02/21/2020 FINAL  Final    Organism ID, Bacteria KLEBSIELLA PNEUMONIAE (A)  Final      Susceptibility   Klebsiella pneumoniae - MIC*    AMPICILLIN RESISTANT Resistant     CEFAZOLIN <=4 SENSITIVE Sensitive     CEFTRIAXONE <=0.25 SENSITIVE Sensitive     CIPROFLOXACIN <=0.25 SENSITIVE Sensitive     GENTAMICIN <=1 SENSITIVE Sensitive     IMIPENEM <=0.25 SENSITIVE Sensitive     NITROFURANTOIN 32 SENSITIVE Sensitive     TRIMETH/SULFA <=20 SENSITIVE Sensitive     AMPICILLIN/SULBACTAM 4 SENSITIVE Sensitive     PIP/TAZO 8 SENSITIVE Sensitive     * >=100,000 COLONIES/mL KLEBSIELLA PNEUMONIAE  Blood Culture (routine x 2)     Status: None (Preliminary result)   Collection Time: 02/19/20  7:48 PM   Specimen: BLOOD  Result Value Ref Range Status   Specimen Description   Final    BLOOD BLOOD RIGHT FOREARM Performed at [facility] [address], [place], [place] [address]    Special Requests   Final    BOTTLES DRAWN AEROBIC AND ANAEROBIC Blood Culture adequate volume Performed at [facility] [address], [place], [place] [address]    Culture   Final    NO GROWTH 4 DAYS Performed at [facility], [place] [address], [place], [place] [address]    Report Status PENDING  Incomplete  SARS Coronavirus 2 by RT PCR (hospital order, performed in [facility] hospital lab) Nasopharyngeal Nasopharyngeal Swab     Status: Abnormal   Collection Time: 02/19/20  8:22 PM   Specimen: Nasopharyngeal Swab  Result Value Ref Range Status   SARS Coronavirus 2 POSITIVE (A) NEGATIVE Final    Comment: RESULT CALLED TO, READ BACK BY AND VERIFIED WITH: ILENE HODGES @ 2132 ON 02/19/20 C VARNER (NOTE) SARS-CoV-2 target nucleic acids are DETECTED  SARS-CoV-2 RNA is generally detectable in upper respiratory specimens  during the acute phase of infection.  Positive results are indicative  of the presence of the identified virus, but do not rule out bacterial infection or co-infection with other  pathogens not detected by the test.  Clinical correlation with patient history and  other diagnostic information is necessary to determine patient infection status.  The expected result is negative.  Fact Sheet for Patients:   StrictlyIdeas.no   Fact Sheet for Healthcare Providers:   BankingDealers.co.za    This test is not yet approved or cleared by the [place] FDA and  has been authorized for detection and/or diagnosis of SARS-CoV-2 by FDA under an Emergency Use Authorization (EUA).  This EUA will remain in effect (meaning thi s test can be used) for the duration of  the COVID-19 declaration under Section 564(b)(1) of the Act, 21 U.S.C. section 360-bbb-3(b)(1), unless the authorization is terminated or revoked sooner.  Performed at [facility], [place] [address] [place], [place] [address]          Radiology Studies: No results found.      Scheduled Meds: . vitamin C  500 mg Oral Daily  . atorvastatin  20 mg Oral q1800  . baclofen  40 mg Oral QID  . carbidopa-levodopa  1 tablet Oral BID PC  . Chlorhexidine Gluconate Cloth  6 each Topical Daily  . docusate sodium  100 mg Oral BID  . enoxaparin (LOVENOX) injection  40 mg Subcutaneous Q24H  . gabapentin  300 mg Oral QHS  . guaiFENesin  1,200 mg Oral BID  . insulin aspart  0-5 Units Subcutaneous QHS  . insulin aspart  0-9 Units Subcutaneous TID WC  . latanoprost  1 drop Both Eyes QHS  . levothyroxine  125 mcg Oral Q0600  . mouth rinse  15 mL Mouth Rinse BID  . methocarbamol  750 mg Oral Q6H  . methylPREDNISolone (SOLU-MEDROL) injection  40 mg Intravenous Q12H  . mometasone-formoterol  2 puff Inhalation BID   And  . umeclidinium bromide  1 puff Inhalation Daily  . oxymetazoline  1 spray Each Nare BID  . sodium chloride flush  3 mL Intravenous Q12H  . zinc sulfate  220 mg Oral Daily   Continuous Infusions: . remdesivir 100 mg in NS 100 mL 100  mg (02/23/20 0949)     LOS: 4 days    Time spent: 36 minutes spent on chart review, discussion with nursing staff, consultants, updating family and interview/physical exam; more than 50% of that time was spent in counseling and/or coordination of care.    Gaylyn Berish J [place] DO [facility] Available via Epic secure chat 7am-7pm After these hours, please refer to coverage provider listed on [email or web address] 02/23/2020, 11:17 AM

## 2020-02-23 NOTE — Consult Note (Signed)
WOC Nurse Consult Note: Performed remotely, patient with COVID and is followed by the wound care center.  Reason for Consult: sacral pressure injury  Wound type: Stage 4 Pressure injury; documented from original POC; followed by the wound care center.   Pressure Injury POA: Yes Measurement: see below; upon admission to [facility] documented as 3cm x 2cm x 1cm (comprable to last [place] visit) Wound bed: Nursing assessment: red mostly/some yellow; consistent with last [place] visit.  See below Per wound care center visit 02/18/20:  Wound #1 status is Open. Original cause of wound was Pressure Injury. The wound is located on the Sacrum. The wound measures 2.7cm length x 1.2cm width x 1.7cm depth; 2.545cm^2 area and 4.326cm^3 volume. There is Fat Layer (Subcutaneous Tissue) exposed. There is no tunneling or undermining noted. There is a medium amount of serosanguineous drainage noted. The wound margin is well defined and not attached to the wound base. There is large (67-100%) red, hyper - granulation within the wound bed. There is a small (1-33%) amount of necrotic tissue within the wound bed including Adherent Slough. Drainage (amount, consistency, odor) scant; serosanguinous  Periwound: intact Dressing procedure/placement/frequency: Patient has Medela NPWT machine at home that is managed by [facility]. This was just recently initiated.  Will utilize dressings based on current depth and presentation as well as the most recent POC per the wound care center.  Line sacral wound bed with silver hydrofiber; pack with fluff filled gauze. Top with dry dressing. Change daily Low air loss mattress Turn from side to side every 2 hours at minimum Prevalon boots; recently healed foot wounds.   Discussed POC with patient and bedside nurse.  Re consult if needed, will not follow at this time. Thanks  Woodfin Kiss [employer], RN,CWOCN, CNS, [place] [phone])

## 2020-02-23 NOTE — Plan of Care (Signed)
  Problem: Elimination: Goal: Will not experience complications related to bowel motility Outcome: Progressing   Problem: Activity: Goal: Risk for activity intolerance will decrease Outcome: Progressing   Problem: Pain Managment: Goal: General experience of comfort will improve Outcome: Progressing

## 2020-02-24 LAB — COMPREHENSIVE METABOLIC PANEL
ALT: 22 U/L (ref 0–44)
AST: 22 U/L (ref 15–41)
Albumin: 3.5 g/dL (ref 3.5–5.0)
Alkaline Phosphatase: 60 U/L (ref 38–126)
Anion gap: 9 (ref 5–15)
BUN: 25 mg/dL — ABNORMAL HIGH (ref 8–23)
CO2: 29 mmol/L (ref 22–32)
Calcium: 8.7 mg/dL — ABNORMAL LOW (ref 8.9–10.3)
Chloride: 102 mmol/L (ref 98–111)
Creatinine, Ser: 0.63 mg/dL (ref 0.61–1.24)
GFR calc Af Amer: >60 mL/min (ref >60)
GFR calc non Af Amer: >60 mL/min (ref >60)
Glucose, Bld: 114 mg/dL — ABNORMAL HIGH (ref 70–99)
Potassium: 4.2 mmol/L (ref 3.5–5.1)
Sodium: 140 mmol/L (ref 135–145)
Total Bilirubin: 0.6 mg/dL (ref 0.3–1.2)
Total Protein: 7 g/dL (ref 6.5–8.1)

## 2020-02-24 LAB — PHOSPHORUS: Phosphorus: 3.3 mg/dL (ref 2.5–4.6)

## 2020-02-24 LAB — CBC WITH DIFFERENTIAL/PLATELET
Abs Immature Granulocytes: 0.03 10*3/uL (ref 0.00–0.07)
Basophils Absolute: 0 10*3/uL (ref 0.0–0.1)
Basophils Relative: 0 %
Eosinophils Absolute: 0 10*3/uL (ref 0.0–0.5)
Eosinophils Relative: 0 %
HCT: 41.4 % (ref 39.0–52.0)
Hemoglobin: 13.2 g/dL (ref 13.0–17.0)
Immature Granulocytes: 1 %
Lymphocytes Relative: 13 %
Lymphs Abs: 0.7 10*3/uL (ref 0.7–4.0)
MCH: 26.9 pg (ref 26.0–34.0)
MCHC: 31.9 g/dL (ref 30.0–36.0)
MCV: 84.3 fL (ref 80.0–100.0)
Monocytes Absolute: 0.4 10*3/uL (ref 0.1–1.0)
Monocytes Relative: 7 %
Neutro Abs: 4.5 10*3/uL (ref 1.7–7.7)
Neutrophils Relative %: 79 %
Platelets: 188 10*3/uL (ref 150–400)
RBC: 4.91 MIL/uL (ref 4.22–5.81)
RDW: 15.3 % (ref 11.5–15.5)
WBC: 5.7 10*3/uL (ref 4.0–10.5)
nRBC: 0 % (ref 0.0–0.2)

## 2020-02-24 LAB — CULTURE, BLOOD (ROUTINE X 2)
Culture: NO GROWTH
Culture: NO GROWTH
Special Requests: ADEQUATE

## 2020-02-24 LAB — D-DIMER, QUANTITATIVE: D-Dimer, Quant: 0.47 ug/mL-FEU (ref 0.00–0.50)

## 2020-02-24 LAB — FERRITIN: Ferritin: 87 ng/mL (ref 24–336)

## 2020-02-24 LAB — MAGNESIUM: Magnesium: 2.3 mg/dL (ref 1.7–2.4)

## 2020-02-24 LAB — C-REACTIVE PROTEIN: CRP: 2 mg/dL — ABNORMAL HIGH (ref <1.0)

## 2020-02-24 LAB — GLUCOSE, CAPILLARY
Glucose-Capillary: 92 mg/dL (ref 70–99)
Glucose-Capillary: 106 mg/dL — ABNORMAL HIGH (ref 70–99)

## 2020-02-24 MED ORDER — ASCORBIC ACID 500 MG PO TABS: 500.0000 mg | ORAL_TABLET | Freq: Every day | ORAL | 0 refills | Status: AC

## 2020-02-24 MED ORDER — PREDNISONE 10 MG PO TABS: ORAL_TABLET | ORAL | 0 refills | Status: DC

## 2020-02-24 MED ORDER — ZINC SULFATE 220 (50 ZN) MG PO CAPS: 220.0000 mg | ORAL_CAPSULE | Freq: Every day | ORAL | 0 refills | Status: AC

## 2020-02-24 NOTE — Discharge Summary (Signed)
Physician Discharge Summary  [name] ZSW:[id number] DOB: 06/16/1941 DOA: 02/19/2020  PCP: [name], MD  Admit date: 02/19/2020 Discharge date: 02/24/2020  Admitted From: Home Disposition: Home  Recommendations for Outpatient Follow-up:  1. Follow up with PCP in 1-2 weeks 2. Continue prednisone taper following discharge 3. Continue supportive care with zinc, vitamin C 4. Albuterol MDI/neb as needed  Home Health: OT/RN/aide Equipment/Devices: Foley catheter (chronic), oxygen, 2 L nasal cannula (chronic)  Discharge Condition: Stable CODE STATUS: DNR Diet recommendation: Heart healthy diet  History of present illness:  [name] is a 79 y.o.malewith medical history significant forCOPD/chronic respiratory failure with hypoxia on 2 L supplemental O2 via [place] at home,chronic diastolic CHF (last EF 32-20% by TTE 09/18/2019), T2DM, HTN, HLD, hypothyroidism, PVD, thoracic ascending aortic aneurysm, CAS s/p left CEA, neurogenic bladder with chronic indwelling Foley catheter, spinal stenosis with chronic pain and nonambulatory status,sacral wound with wound VAC in place,OSA on CPAP who presents to the ED for evaluation of fevers,dyspnea, diaphoresis,cough, and malaise.  Patient states he is chronically bedbound and nonambulatory. 2 days ago he began to have new shortness of breath and cough productive of white sputum which worsened significantly last night. He has been having associated fevers, chills, diaphoresis. He has been feeling dehydrated and says he has not been eating and drinking as well the last 2 days. 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 He has chronic indwelling Foley catheter in place and has not noticed any significant change in urine output or color. He says he did not have significant improvement in symptoms with home oxygen or inhalers therefore presented to the ED for further evaluation management.  ED Course: BP 85/56, pulse 95,  RR 22, temp 100.2 Fahrenheit, SPO2 97% on NRB 10 L O2. O2 was weaned to 4 L supplemental O2 via [place] with saturations >95%. Labs notable for WBC 7.7, hemoglobin 13.0, platelets 219,000, sodium 137, potassium 3.9, bicarb 27, BUN 40, creatinine 0.95, LFTs within normal limits, lactic acid 1.1. Urinalysis showed positive nitrates, large leukocytes, 6-10 RBC/hpf, >50 WBC/hpf, many bacteria microscopy. Urine and blood cultures were obtained and pending. SARS-CoV-2 PCR is positive. Portable chest x-ray shows right upper lobe pulmonary infiltrate. Patient was given IV vancomycin, cefepime, Flagyl, 3 L LR. The hospitalist service was consulted to admit for further evaluation and management.  Hospital course:  Severe sepsis, present on admission Acute on chronic hypoxic respiratory failure secondary to acute Covid-19 viral pneumonia during the ongoing 2020 Covid 19 Pandemic - POA CXR with right upper lobe infiltrate. Has increased O2 requirement from home 2 L to 4 L O2 via [place]. Met SIRS criteria with tachypnea and RR 22, tachycardia to 96. Was hypotensive with SBP 85 which improved after aggressive 3L IV fluid resuscitation in the ED. COVID test: + PCR 9/2.  Patient was started on therapy with remdesivir and completed 5-day course.  Also was started on high-dose IV steroids which were tapered during hospitalization with improvement of his inflammatory markers.  Patient's oxygen requirements were titrated down to his normal baseline of 2 L nasal cannula.  Patient will continue prednisone taper on discharge.  Continue supportive care with vitamin C, zinc, albuterol MDI/neb as needed.  Continue incentive spirometry/flutter valve on discharge.  Continue home isolation for 14 days from initial diagnosis of Covid infection.  The treatment plan and use of medications and known side effects were discussed with patient/family. Some of the medications used are based on case reports/anecdotal  data.  All other medications  being used in the management of COVID-19 based on limited study data.  Complete risks and long-term side effects are unknown, however in the best clinical judgment they seem to be of some benefit.  Patient wanted to proceed with treatment options provided.  COPD on 2L [place] baseline Continue Trelegy Ellipta inhaler, albuterol MDI/nebs as needed.  Chronic diastolic CHF: Volume depleted on arrival requiring aggressive fluid resuscitation in the ED. continue home metoprolol, furosemide, spironolactone.  Essential Hypertension: Resume home furosemide 40 mg p.o. daily, metoprolol succinate 25 mg p.o. daily, spironolactone 25 mg p.o. daily now that hypotension has resolved.  Diet controlled type 2 diabetes: Recommend heart healthy/consistent carbohydrate diet.  Neurogenic bladder with chronic indwelling Foley catheter: Urinalysis abnormal in setting of chronic indwelling Foley catheter, possibly colonized without active UTI. Hold further antibiotics, urine culture with Klebsiella pneumonia, asymptomatic without fever or elevated WBC count; likely colonization. Continue Foley care.  Sacral pressure wound, present on admission: Lower extremity pressure wounds, present on admission Resume Wound VAC following discharge. Follows with wound center.  Hypothyroidism: Continue Synthroid 125 mcg p.o. daily.  Hyperlipidemia: Continue atorvastatin 20 mg p.o. daily.  Chronic pain syndrome/spinal stenosis/spasticity/RLS: Continue home Norco as needed with hold parameters, Robaxin, gabapentin, baclofen, Sinemet.  OSA: Continue CPAP nightly.  Discharge Diagnoses:  Principal Problem:   Acute hypoxemic respiratory failure due to COVID-19 [facility]) Active Problems:   OSA on CPAP   Hypertension associated with diabetes (HCC)   Chronic diastolic CHF (congestive heart failure) (HCC)   Diabetes mellitus type 2, controlled, with complications (HCC)   Neurogenic bladder   Hypothyroidism    Hyperlipidemia associated with type 2 diabetes mellitus (HCC)   Chronic indwelling Foley catheter   COPD (chronic obstructive pulmonary disease) ([place])   Pneumonia due to COVID-19 virus    Discharge Instructions  Discharge Instructions    Call MD for:  difficulty breathing, headache or visual disturbances   Complete by: As directed    Call MD for:  extreme fatigue   Complete by: As directed    Call MD for:  persistant dizziness or light-headedness   Complete by: As directed    Call MD for:  persistant nausea and vomiting   Complete by: As directed    Call MD for:  severe uncontrolled pain   Complete by: As directed    Call MD for:  temperature >100.4   Complete by: As directed    Diet - low sodium heart healthy   Complete by: As directed    Discharge wound care:   Complete by: As directed    Continue home wound care and schedule appointments at wound care clinic.  Resume wound VAC on discharge.   Increase activity slowly   Complete by: As directed      Allergies as of 02/24/2020   No Known Allergies     Medication List    STOP taking these medications   tamsulosin 0.4 MG Caps capsule Commonly known as: FLOMAX     TAKE these medications   acetaminophen 325 MG tablet Commonly known as: TYLENOL Take 2 tablets (650 mg total) by mouth every 6 (six) hours as needed (pain or temperature).   albuterol (2.5 MG/3ML) 0.083% nebulizer solution Commonly known as: PROVENTIL Inhale 3 mLs (2.5 mg total) into the lungs every 4 (four) hours as needed for wheezing or shortness of breath.   albuterol 108 (90 Base) MCG/ACT inhaler Commonly known as: VENTOLIN HFA Inhale 1 puff into the lungs  every 4 (four) hours as needed for wheezing or shortness of breath.   ascorbic acid 500 MG tablet Commonly known as: VITAMIN C Take 1 tablet (500 mg total) by mouth daily. Start taking on: February 25, 2020   aspirin EC 325 MG tablet Take 325 mg by mouth daily. What changed: Another  medication with the same name was removed. Continue taking this medication, and follow the directions you see here.   atorvastatin 20 MG tablet Commonly known as: LIPITOR Take 1 tablet (20 mg total) by mouth daily at 6 PM.   baclofen 20 MG tablet Commonly known as: LIORESAL Take 2 tablets (40 mg total) by mouth 4 (four) times daily. For spasticity   calcium citrate 950 (200 Ca) MG tablet Commonly known as: CALCITRATE - dosed in mg elemental calcium Take 1 tablet (200 mg of elemental calcium total) by mouth daily.   carbidopa-levodopa 10-100 MG tablet Commonly known as: SINEMET IR Take 1 tablet by mouth 2 (two) times daily.   cholecalciferol 25 MCG (1000 UNIT) tablet Commonly known as: VITAMIN D Take 1 tablet (1,000 Units total) by mouth daily.   colchicine 0.6 MG tablet Take 1 tablet (0.6 mg total) by mouth daily.   docusate sodium 100 MG capsule Commonly known as: COLACE Take 1 capsule (100 mg total) by mouth 2 (two) times daily.   furosemide 40 MG tablet Commonly known as: LASIX Take 1 tablet (40 mg total) by mouth daily.   gabapentin 300 MG capsule Commonly known as: NEURONTIN Take 300 mg by mouth at bedtime.   guaiFENesin 600 MG 12 hr tablet Commonly known as: MUCINEX Take 2 tablets (1,200 mg total) by mouth 2 (two) times daily.   HYDROcodone-acetaminophen 5-325 MG tablet Commonly known as: NORCO/VICODIN Take 1 tablet by mouth every 6 (six) hours as needed for moderate pain or severe pain. What changed: when to take this   latanoprost 0.005 % ophthalmic solution Commonly known as: XALATAN Place 1 drop into both eyes at bedtime.   levothyroxine 125 MCG tablet Commonly known as: SYNTHROID Take 1 tablet (125 mcg total) by mouth daily before breakfast.   methocarbamol 750 MG tablet Commonly known as: ROBAXIN Take 1 tablet (750 mg total) by mouth every 6 (six) hours as needed for muscle spasms.   metoprolol succinate 25 MG 24 hr tablet Commonly known as:  TOPROL-XL Take 1 tablet (25 mg total) by mouth daily.   MiraLax 17 GM/SCOOP powder Generic drug: polyethylene glycol powder Take 0.5 Containers by mouth daily. 17 gm every other day   multivitamin tablet Take 1 tablet by mouth daily.   nitroGLYCERIN 0.4 MG SL tablet Commonly known as: NITROSTAT Place 0.4 mg every 5 (five) minutes as needed under the tongue for chest pain.   pantoprazole 40 MG tablet Commonly known as: PROTONIX Take 1 tablet (40 mg total) by mouth every evening.   predniSONE 10 MG tablet Commonly known as: DELTASONE Take 4 tablets (40 mg total) by mouth daily for 3 days, THEN 3 tablets (30 mg total) daily for 3 days, THEN 2 tablets (20 mg total) daily for 3 days, THEN 1 tablet (10 mg total) daily for 3 days. Start taking on: February 25, 2020   rizatriptan 10 MG tablet Commonly known as: MAXALT Take 10 mg by mouth daily as needed for migraine.   senna-docusate 8.6-50 MG tablet Commonly known as: Senokot-S Take 2 tablets by mouth 2 (two) times daily. What changed: when to take this   spironolactone 25 MG  tablet Commonly known as: ALDACTONE Take 1 tablet (25 mg total) by mouth daily. What changed: when to take this   Trelegy Ellipta 100-62.5-25 MCG/INH Aepb Generic drug: Fluticasone-Umeclidin-Vilant Inhale 1 puff into the lungs daily.   zinc sulfate 220 (50 Zn) MG capsule Take 1 capsule (220 mg total) by mouth daily. Start taking on: February 25, 2020            Discharge Care Instructions  (From admission, onward)         Start     Ordered   02/24/20 0000  Discharge wound care:       Comments: Continue home wound care and schedule appointments at wound care clinic.  Resume wound VAC on discharge.   02/24/20 0957          Follow-up Information    [name], MD. Schedule an appointment as soon as possible for a visit in 1 week(s).   Specialty: Internal Medicine Contact information: [place]  [address] [phone]        Minus Breeding, MD .   Specialty: Cardiology Contact information: [address] [place] [address] [phone]              No Known Allergies  Consultations:  None   Procedures/Studies: DG Chest Port 1 View  Result Date: 02/19/2020 CLINICAL DATA:  Sepsis EXAM: PORTABLE CHEST 1 VIEW COMPARISON:  11/10/2019 FINDINGS: Lung volumes are small and pulmonary insufflation has diminished since prior examination. Focal pulmonary infiltrate has developed within the right upper lobe, possibly infectious or inflammatory in nature. No pneumothorax or pleural effusion. Cardiac size within normal limits. Thoracic aorta is tortuous and ectatic, unchanged. Pulmonary vascularity is normal. No acute bone abnormality. IMPRESSION: Focal pulmonary infiltrate has developed within the right upper lobe, possibly infectious or inflammatory in nature. Electronically Signed   By: Fidela Salisbury MD   On: 02/19/2020 20:50   VAS US CAROTID  Result Date: 01/28/2020 Carotid Arterial Duplex Study Indications:       Carotid artery disease, left endarterectomy and Patient                    denies any cerebrovascular symptoms. Risk Factors:      Hypertension, hyperlipidemia, Diabetes, past history of                    smoking. Limitations        Today's exam was limited due to Patient scanned in                    wheelchair. Comparison Study:  In 11/2012, a carotid duplex showed a RICA velocity of 112/40                    cm/s and a LICA velocity of 38/75 cm/s Performing Technologist: Wilkie Aye RVT  Examination Guidelines: A complete evaluation includes B-mode imaging, spectral Doppler, color Doppler, and power Doppler as needed of all accessible portions of each vessel. Bilateral testing is considered an integral part of a complete examination. Limited examinations for reoccurring indications may be performed as noted.  Right Carotid Findings:  +----------+--------+--------+--------+--------------------------+--------+           PSV cm/sEDV cm/sStenosisPlaque Description        Comments +----------+--------+--------+--------+--------------------------+--------+ CCA Prox  111     14                                                 +----------+--------+--------+--------+--------------------------+--------+  CCA Distal74      11      <50%    heterogenous and irregular         +----------+--------+--------+--------+--------------------------+--------+ ICA Prox  164     41      40-59%  heterogenous and irregular         +----------+--------+--------+--------+--------------------------+--------+ ICA Mid   84      20                                                 +----------+--------+--------+--------+--------------------------+--------+ ICA Distal65      21                                                 +----------+--------+--------+--------+--------------------------+--------+ ECA       141     2               heterogenous and irregular         +----------+--------+--------+--------+--------------------------+--------+ +----------+--------+-------+----------------+-------------------+           PSV cm/sEDV cmsDescribe        Arm Pressure (mmHG) +----------+--------+-------+----------------+-------------------+ [id number]             Multiphasic, GBM211                 +----------+--------+-------+----------------+-------------------+ +---------+--------+--+--------+--+---------+ VertebralPSV cm/s29EDV cm/s10Antegrade +---------+--------+--+--------+--+---------+  Left Carotid Findings: +----------+--------+--------+--------+--------------------------+--------+           PSV cm/sEDV cm/sStenosisPlaque Description        Comments +----------+--------+--------+--------+--------------------------+--------+ CCA Prox  59      10                                                  +----------+--------+--------+--------+--------------------------+--------+ CCA Mid   72      14                                                 +----------+--------+--------+--------+--------------------------+--------+ CCA Distal63      16      <50%    heterogenous and irregular         +----------+--------+--------+--------+--------------------------+--------+ ICA Prox  82      27      1-39%   heterogenous and irregular         +----------+--------+--------+--------+--------------------------+--------+ ICA Mid   80      22                                                 +----------+--------+--------+--------+--------------------------+--------+ ICA Distal96      23                                                 +----------+--------+--------+--------+--------------------------+--------+ ECA  79      11                                                 +----------+--------+--------+--------+--------------------------+--------+ +----------+--------+--------+----------------+-------------------+           PSV cm/sEDV cm/sDescribe        Arm Pressure (mmHG) +----------+--------+--------+----------------+-------------------+ [id number]             Multiphasic, XNT700                 +----------+--------+--------+----------------+-------------------+ +---------+--------+-------+--------------------------------------------------+ VertebralPSV cm/sEDV    unable to visualize possibly due to patients                        cm/s   position                                           +---------+--------+-------+--------------------------------------------------+   Summary: Right Carotid: Velocities in the right ICA are consistent with a 40-59%                stenosis. Non-hemodynamically significant plaque <50% noted in                the CCA. Left Carotid: Velocities in the left ICA are consistent with a 1-39% stenosis.               Non-hemodynamically  significant plaque <50% noted in the CCA. Vertebrals:  Right vertebral artery demonstrates antegrade flow. Left vertebral              artery was not visualized. Subclavians: Normal flow hemodynamics were seen in bilateral subclavian              arteries. *See table(s) above for measurements and observations. Suggest follow up study in 12 months. Electronically signed by Larae Grooms MD on 01/28/2020 at 12:30:39 PM.    Final       Subjective: Patient seen and examined bedside, resting comfortably.  Eating breakfast.  No complaints this morning and ready for discharge home.  Completed remdesivir infusion today and will continue prednisone taper outpatient.  Denies headache, no dizziness, no chest pain, palpitations, no shortness of breath, no abdominal pain.  No acute events overnight per nursing staff.  Discharge Exam: Vitals:   02/23/20 2053 02/24/20 0347  BP: (!) 151/92 (!) 151/83  Pulse: 66 71  Resp: 18 18  Temp: 98 F (36.7 C) 98.4 F (36.9 C)  SpO2: 94% 95%   Vitals:   02/23/20 1350 02/23/20 1940 02/23/20 2053 02/24/20 0347  BP: (!) 150/73  (!) 151/92 (!) 151/83  Pulse: 70  66 71  Resp: '16  18 18  ' Temp: 98.4 F (36.9 C)  98 F (36.7 C) 98.4 F (36.9 C)  TempSrc: Oral  Oral   SpO2: 94% 91% 94% 95%  Weight:      Height:        General: Pt is alert, awake, not in acute distress Cardiovascular: RRR, S1/S2 +, no rubs, no gallops Respiratory: CTA bilaterally, no wheezing, no rhonchi, on 2 L nasal cannula Abdominal: Soft, NT, ND, bowel sounds + Extremities: no edema, no cyanosis    The results of significant diagnostics from this hospitalization (including imaging, microbiology, ancillary and laboratory) are  listed below for reference.     Microbiology: Recent Results (from the past 240 hour(s))  Blood Culture (routine x 2)     Status: None   Collection Time: 02/19/20  7:43 PM   Specimen: BLOOD  Result Value Ref Range Status   Specimen Description   Final     BLOOD BLOOD LEFT FOREARM Performed at [facility] [address], [place], [place] [address]    Special Requests   Final    BOTTLES DRAWN AEROBIC AND ANAEROBIC Blood Culture results may not be optimal due to an inadequate volume of blood received in culture bottles Performed at [facility] [address], [place], [place] [address]    Culture   Final    NO GROWTH 5 DAYS Performed at [facility], [place] [address] [place], [place] [address]    Report Status 02/24/2020 FINAL  Final  Urine culture     Status: Abnormal   Collection Time: 02/19/20  7:43 PM   Specimen: In/Out Cath Urine  Result Value Ref Range Status   Specimen Description   Final    IN/OUT CATH URINE Performed at [facility] [address] [place], [place] [address]    Special Requests   Final    NONE Performed at [facility], [place] [address] [place], [place] [address]    Culture >=100,000 COLONIES/mL KLEBSIELLA PNEUMONIAE (A)  Final   Report Status 02/21/2020 FINAL  Final   Organism ID, Bacteria KLEBSIELLA PNEUMONIAE (A)  Final      Susceptibility   Klebsiella pneumoniae - MIC*    AMPICILLIN RESISTANT Resistant     CEFAZOLIN <=4 SENSITIVE Sensitive     CEFTRIAXONE <=0.25 SENSITIVE Sensitive     CIPROFLOXACIN <=0.25 SENSITIVE Sensitive     GENTAMICIN <=1 SENSITIVE Sensitive     IMIPENEM <=0.25 SENSITIVE Sensitive     NITROFURANTOIN 32 SENSITIVE Sensitive     TRIMETH/SULFA <=20 SENSITIVE Sensitive     AMPICILLIN/SULBACTAM 4 SENSITIVE Sensitive     PIP/TAZO 8 SENSITIVE Sensitive     * >=100,000 COLONIES/mL KLEBSIELLA PNEUMONIAE  Blood Culture (routine x 2)     Status: None   Collection Time: 02/19/20  7:48 PM   Specimen: BLOOD  Result Value Ref Range Status   Specimen Description   Final    BLOOD BLOOD RIGHT FOREARM Performed at [facility] [address] [place], [place] [address]    Special  Requests   Final    BOTTLES DRAWN AEROBIC AND ANAEROBIC Blood Culture adequate volume Performed at [facility] [address], [place], [place] [address]    Culture   Final    NO GROWTH 5 DAYS Performed at [facility], [place] [address] [place], [place] [address]    Report Status 02/24/2020 FINAL  Final  SARS Coronavirus 2 by RT PCR (hospital order, performed in [facility] hospital lab) Nasopharyngeal Nasopharyngeal Swab     Status: Abnormal   Collection Time: 02/19/20  8:22 PM   Specimen: Nasopharyngeal Swab  Result Value Ref Range Status   SARS Coronavirus 2 POSITIVE (A) NEGATIVE Final    Comment: RESULT CALLED TO, READ BACK BY AND VERIFIED WITH: ILENE HODGES @ 2132 ON 02/19/20 C VARNER (NOTE) SARS-CoV-2 target nucleic acids are DETECTED  SARS-CoV-2 RNA is generally detectable in upper respiratory specimens  during the acute phase of infection.  Positive results are indicative  of the presence of the  identified virus, but do not rule out bacterial infection or co-infection with other pathogens not detected by the test.  Clinical correlation with patient history and  other diagnostic information is necessary to determine patient infection status.  The expected result is negative.  Fact Sheet for Patients:   StrictlyIdeas.no   Fact Sheet for Healthcare Providers:   BankingDealers.co.za    This test is not yet approved or cleared by the [place] FDA and  has been authorized for detection and/or diagnosis of SARS-CoV-2 by FDA under an Emergency Use Authorization (EUA).  This EUA will remain in effect (meaning thi s test can be used) for the duration of  the COVID-19 declaration under Section 564(b)(1) of the Act, 21 U.S.C. section 360-bbb-3(b)(1), unless the authorization is terminated or revoked sooner.  Performed at [facility], [place] [address] [place], [place] [address]       Labs: BNP (last 3 results) Recent Labs    10/03/19 0557  BNP 85.8   Basic Metabolic Panel: Recent Labs  Lab 02/20/20 0529 02/21/20 0514 02/22/20 0454 02/23/20 0507 02/24/20 0453  NA 140 142 141 141 140  K 3.9 4.2 4.5 4.2 4.2  CL 102 104 102 101 102  CO2 '27 27 28 28 29  ' GLUCOSE 125* 116* 106* 100* 114*  BUN 26* 27* 29* 28* 25*  CREATININE 0.73 0.71 0.81 0.71 0.63  CALCIUM 8.4* 8.8* 8.8* 8.7* 8.7*  MG 2.0 2.6* 2.5* 2.3 2.3  PHOS 3.3 4.0 3.8 3.1 3.3   Liver Function Tests: Recent Labs  Lab 02/20/20 0529 02/21/20 0514 02/22/20 0454 02/23/20 0507 02/24/20 0453  AST '17 18 31 24 22  ' ALT '12 13 22 26 22  ' ALKPHOS 62 59 57 59 60  BILITOT 0.5 0.4 0.6 0.7 0.6  PROT 6.8 6.9 7.0 6.4* 7.0  ALBUMIN 3.5 3.5 3.6 3.4* 3.5   No results for input(s): LIPASE, AMYLASE in the last 168 hours. No results for input(s): AMMONIA in the last 168 hours. CBC: Recent Labs  Lab 02/20/20 0700 02/21/20 0514 02/22/20 0454 02/23/20 0507 02/24/20 0453  WBC 6.0 5.3 6.8 7.0 5.7  NEUTROABS 4.8 3.8 5.1 5.5 4.5  HGB 11.7* 13.4 13.3 12.3* 13.2  HCT 35.9* 42.3 42.2 38.6* 41.4  MCV 85.3 84.8 85.9 85.8 84.3  PLT 188 227 214 186 188   Cardiac Enzymes: No results for input(s): CKTOTAL, CKMB, CKMBINDEX, TROPONINI in the last 168 hours. BNP: Invalid input(s): POCBNP CBG: Recent Labs  Lab 02/23/20 0752 02/23/20 1130 02/23/20 1618 02/23/20 2055 02/24/20 0736  GLUCAP 82 100* 109* 111* 92   D-Dimer Recent Labs    02/23/20 0507 02/24/20 0453  DDIMER 0.27 0.47   Hgb A1c No results for input(s): HGBA1C in the last 72 hours. Lipid Profile No results for input(s): CHOL, HDL, LDLCALC, TRIG, CHOLHDL, LDLDIRECT in the last 72 hours. Thyroid function studies No results for input(s): TSH, T4TOTAL, T3FREE, THYROIDAB in the last 72 hours.  Invalid input(s): FREET3 Anemia work up Recent Labs    02/23/20 0507 02/24/20 0453  FERRITIN 59 87   Urinalysis    Component Value Date/Time    COLORURINE YELLOW 02/19/2020 1943   APPEARANCEUR CLOUDY (A) 02/19/2020 1943   LABSPEC 1.026 02/19/2020 1943   PHURINE 5.0 02/19/2020 1943   GLUCOSEU NEGATIVE 02/19/2020 1943   HGBUR NEGATIVE 02/19/2020 1943   BILIRUBINUR SMALL (A) 02/19/2020 1943   KETONESUR NEGATIVE 02/19/2020 1943   PROTEINUR 30 (A) 02/19/2020 1943   UROBILINOGEN 1.0 09/09/2010 1045  NITRITE POSITIVE (A) 02/19/2020 1943   LEUKOCYTESUR LARGE (A) 02/19/2020 1943   Sepsis Labs Invalid input(s): PROCALCITONIN,  WBC,  LACTICIDVEN Microbiology Recent Results (from the past 240 hour(s))  Blood Culture (routine x 2)     Status: None   Collection Time: 02/19/20  7:43 PM   Specimen: BLOOD  Result Value Ref Range Status   Specimen Description   Final    BLOOD BLOOD LEFT FOREARM Performed at [facility] [address], [place], [place] [address]    Special Requests   Final    BOTTLES DRAWN AEROBIC AND ANAEROBIC Blood Culture results may not be optimal due to an inadequate volume of blood received in culture bottles Performed at [facility] [address] [place], [place] [address]    Culture   Final    NO GROWTH 5 DAYS Performed at [facility], [place] [address] [place], [place] [address]    Report Status 02/24/2020 FINAL  Final  Urine culture     Status: Abnormal   Collection Time: 02/19/20  7:43 PM   Specimen: In/Out Cath Urine  Result Value Ref Range Status   Specimen Description   Final    IN/OUT CATH URINE Performed at [facility] [address], [place], [place] [address]    Special Requests   Final    NONE Performed at [facility], [place] [address], [place], [place] [address]    Culture >=100,000 COLONIES/mL KLEBSIELLA PNEUMONIAE (A)  Final   Report Status 02/21/2020 FINAL  Final   Organism ID, Bacteria KLEBSIELLA PNEUMONIAE (A)  Final      Susceptibility   Klebsiella pneumoniae - MIC*    AMPICILLIN RESISTANT  Resistant     CEFAZOLIN <=4 SENSITIVE Sensitive     CEFTRIAXONE <=0.25 SENSITIVE Sensitive     CIPROFLOXACIN <=0.25 SENSITIVE Sensitive     GENTAMICIN <=1 SENSITIVE Sensitive     IMIPENEM <=0.25 SENSITIVE Sensitive     NITROFURANTOIN 32 SENSITIVE Sensitive     TRIMETH/SULFA <=20 SENSITIVE Sensitive     AMPICILLIN/SULBACTAM 4 SENSITIVE Sensitive     PIP/TAZO 8 SENSITIVE Sensitive     * >=100,000 COLONIES/mL KLEBSIELLA PNEUMONIAE  Blood Culture (routine x 2)     Status: None   Collection Time: 02/19/20  7:48 PM   Specimen: BLOOD  Result Value Ref Range Status   Specimen Description   Final    BLOOD BLOOD RIGHT FOREARM Performed at [facility] [address], [place], [place] [address]    Special Requests   Final    BOTTLES DRAWN AEROBIC AND ANAEROBIC Blood Culture adequate volume Performed at [facility] [address] [place], [place] [address]    Culture   Final    NO GROWTH 5 DAYS Performed at [facility], [place] [address], [place], [place] [address]    Report Status 02/24/2020 FINAL  Final  SARS Coronavirus 2 by RT PCR (hospital order, performed in [facility] hospital lab) Nasopharyngeal Nasopharyngeal Swab     Status: Abnormal   Collection Time: 02/19/20  8:22 PM   Specimen: Nasopharyngeal Swab  Result Value Ref Range Status   SARS Coronavirus 2 POSITIVE (A) NEGATIVE Final    Comment: RESULT CALLED TO, READ BACK BY AND VERIFIED WITH: ILENE HODGES @ 2132 ON 02/19/20 C VARNER (NOTE) SARS-CoV-2 target nucleic acids are DETECTED  SARS-CoV-2 RNA is generally detectable in upper respiratory specimens  during the acute  phase of infection.  Positive results are indicative  of the presence of the identified virus, but do not rule out bacterial infection or co-infection with other pathogens not detected by the test.  Clinical correlation with patient history and  other diagnostic information is necessary to determine  patient infection status.  The expected result is negative.  Fact Sheet for Patients:   StrictlyIdeas.no   Fact Sheet for Healthcare Providers:   BankingDealers.co.za    This test is not yet approved or cleared by the [place] FDA and  has been authorized for detection and/or diagnosis of SARS-CoV-2 by FDA under an Emergency Use Authorization (EUA).  This EUA will remain in effect (meaning thi s test can be used) for the duration of  the COVID-19 declaration under Section 564(b)(1) of the Act, 21 U.S.C. section 360-bbb-3(b)(1), unless the authorization is terminated or revoked sooner.  Performed at [facility], [place] [address] [place], [place] [address]      Time coordinating discharge: Over 30 minutes  SIGNED:   Delois Silvester J [place] DO  [facility] 02/24/2020, 9:57 AM

## 2020-02-24 NOTE — Progress Notes (Signed)
02/24/2020  0934  Patient has a chronic foley catheter. The catheter was placed prior to admission. No date/time on catheter bag. Asked patient if I could change out his catheter before he discharge today. Patient refuses for me to change cathter. Patient states his home health [facility] takes care of his catheter.

## 2020-02-24 NOTE — Progress Notes (Signed)
02/24/2020  1040  Patient calling family to bring portable oxygen tank when they come to pick him up. Patient is currently on 2L O2 at rest 91%.

## 2020-02-24 NOTE — TOC Transition Note (Signed)
Transition of Care [facility]) - CM/SW Discharge Note   Patient Details  Name: [name] MRN: [id number] Date of Birth: [birth year]  [facility] CM/SW Contact:  Trish Mage, LCSW Phone Number: 02/24/2020, 11:54 AM   Clinical Narrative:   Patient who is stable for discharge today is in need of resumption of [place] orders.  Order seen and appreciated.  I contacted [facility] to confirm services and orders. Wife is arranging transport home, and will supply wheelchair and O2 for transport.  No other needs identified.  TOC sign off.    Final next level of care: [place] Barriers to Discharge: No Barriers Identified   Patient Goals and CMS Choice        Discharge Placement                       Discharge Plan and Services                                     Social Determinants of Health (SDOH) Interventions     Readmission Risk Interventions No flowsheet data found.

## 2020-02-24 NOTE — Discharge Instructions (Signed)
COVID-19 Frequently Asked Questions COVID-19 (coronavirus disease) is an infection that is caused by a large family of viruses. Some viruses cause illness in people and others cause illness in animals like camels, cats, and bats. In some cases, the viruses that cause illness in animals can spread to humans. Where did the coronavirus come from? In December 2019, [place] told the World Health Organization (WHO) of several cases of lung disease (human respiratory illness). These cases were linked to an open seafood and livestock market in the city of [place]. The link to the seafood and livestock market suggests that the virus may have spread from animals to humans. However, since that first outbreak in December, the virus has also been shown to spread from person to person. What is the name of the disease and the virus? Disease name Early on, this disease was called novel coronavirus. This is because scientists determined that the disease was caused by a new (novel) respiratory virus. The World Health Organization (WHO) has now named the disease COVID-19, or coronavirus disease. Virus name The virus that causes the disease is called severe acute respiratory syndrome coronavirus 2 (SARS-CoV-2). More information on disease and virus naming World Health Organization (WHO): www.who.int/emergencies/diseases/novel-coronavirus-2019/technical-guidance/naming-the-coronavirus-disease-(covid-2019)-and-the-virus-that-causes-it Who is at risk for complications from coronavirus disease? Some people may be at higher risk for complications from coronavirus disease. This includes older adults and people who have chronic diseases, such as heart disease, diabetes, and lung disease. If you are at higher risk for complications, take these extra precautions:  Stay home as much as possible.  Avoid social gatherings and travel.  Avoid close contact with others. Stay at least 6 ft (2 m) away from others, if possible.  Wash  your hands often with soap and water for at least 20 seconds.  Avoid touching your face, mouth, nose, or eyes.  Keep supplies on hand at home, such as food, medicine, and cleaning supplies.  If you must go out in public, wear a cloth face covering or face mask. Make sure your mask covers your nose and mouth. How does coronavirus disease spread? The virus that causes coronavirus disease spreads easily from person to person (is contagious). You may catch the virus by:  Breathing in droplets from an infected person. Droplets can be spread by a person breathing, speaking, singing, coughing, or sneezing.  Touching something, like a table or a doorknob, that was exposed to the virus (contaminated) and then touching your mouth, nose, or eyes. Can I get the virus from touching surfaces or objects? There is still a lot that we do not know about the virus that causes coronavirus disease. Scientists are basing a lot of information on what they know about similar viruses, such as:  Viruses cannot generally survive on surfaces for long. They need a human body (host) to survive.  It is more likely that the virus is spread by close contact with people who are sick (direct contact), such as through: ? Shaking hands or hugging. ? Breathing in respiratory droplets that travel through the air. Droplets can be spread by a person breathing, speaking, singing, coughing, or sneezing.  It is less likely that the virus is spread when a person touches a surface or object that has the virus on it (indirect contact). The virus may be able to enter the body if the person touches a surface or object and then touches his or her face, eyes, nose, or mouth. Can a person spread the virus without having symptoms of the disease?   It may be possible for the virus to spread before a person has symptoms of the disease, but this is most likely not the main way the virus is spreading. It is more likely for the virus to spread by  being in close contact with people who are sick and breathing in the respiratory droplets spread by a person breathing, speaking, singing, coughing, or sneezing. What are the symptoms of coronavirus disease? Symptoms vary from person to person and can range from mild to severe. Symptoms may include:  Fever or chills.  Cough.  Difficulty breathing or feeling short of breath.  Headaches, body aches, or muscle aches.  Runny or stuffy (congested) nose.  Sore throat.  New loss of taste or smell.  Nausea, vomiting, or diarrhea. These symptoms can appear anywhere from 2 to 14 days after you have been exposed to the virus. Some people may not have any symptoms. If you develop symptoms, call your health care provider. People with severe symptoms may need hospital care. Should I be tested for this virus? Your health care provider will decide whether to test you based on your symptoms, history of exposure, and your risk factors. How does a health care provider test for this virus? Health care providers will collect samples to send for testing. Samples may include:  Taking a swab of fluid from the back of your nose and throat, your nose, or your throat.  Taking fluid from the lungs by having you cough up mucus (sputum) into a sterile cup.  Taking a blood sample. Is there a treatment or vaccine for this virus? Currently, there is no vaccine to prevent coronavirus disease. Also, there are no medicines like antibiotics or antivirals to treat the virus. A person who becomes sick is given supportive care, which means rest and fluids. A person may also relieve his or her symptoms by using over-the-counter medicines that treat sneezing, coughing, and runny nose. These are the same medicines that a person takes for the common cold. If you develop symptoms, call your health care provider. People with severe symptoms may need hospital care. What can I do to protect myself and my family from this  virus?     You can protect yourself and your family by taking the same actions that you would take to prevent the spread of other viruses. Take the following actions:  Wash your hands often with soap and water for at least 20 seconds. If soap and water are not available, use alcohol-based hand sanitizer.  Avoid touching your face, mouth, nose, or eyes.  Cough or sneeze into a tissue, sleeve, or elbow. Do not cough or sneeze into your hand or the air. ? If you cough or sneeze into a tissue, throw it away immediately and wash your hands.  Disinfect objects and surfaces that you frequently touch every day.  Stay away from people who are sick.  Avoid going out in public, follow guidance from your state and local health authorities.  Avoid crowded indoor spaces. Stay at least 6 ft (2 m) away from others.  If you must go out in public, wear a cloth face covering or face mask. Make sure your mask covers your nose and mouth.  Stay home if you are sick, except to get medical care. Call your health care provider before you get medical care. Your health care provider will tell you how long to stay home.  Make sure your vaccines are up to date. Ask your health care provider what   vaccines you need. What should I do if I need to travel? Follow travel recommendations from your local health authority, the CDC, and WHO. Travel information and advice  Centers for Disease Control and Prevention (CDC): www.cdc.gov/coronavirus/2019-ncov/travelers/index.html  World Health Organization (WHO): www.who.int/emergencies/diseases/novel-coronavirus-2019/travel-advice Know the risks and take action to protect your health  You are at higher risk of getting coronavirus disease if you are traveling to areas with an outbreak or if you are exposed to travelers from areas with an outbreak.  Wash your hands often and practice good hygiene to lower the risk of catching or spreading the virus. What should I do if I  am sick? General instructions to stop the spread of infection  Wash your hands often with soap and water for at least 20 seconds. If soap and water are not available, use alcohol-based hand sanitizer.  Cough or sneeze into a tissue, sleeve, or elbow. Do not cough or sneeze into your hand or the air.  If you cough or sneeze into a tissue, throw it away immediately and wash your hands.  Stay home unless you must get medical care. Call your health care provider or local health authority before you get medical care.  Avoid public areas. Do not take public transportation, if possible.  If you can, wear a mask if you must go out of the house or if you are in close contact with someone who is not sick. Make sure your mask covers your nose and mouth. Keep your home clean  Disinfect objects and surfaces that are frequently touched every day. This may include: ? Counters and tables. ? Doorknobs and light switches. ? Sinks and faucets. ? Electronics such as phones, remote controls, keyboards, computers, and tablets.  Wash dishes in hot, soapy water or use a dishwasher. Air-dry your dishes.  Wash laundry in hot water. Prevent infecting other household members  Let healthy household members care for children and pets, if possible. If you have to care for children or pets, wash your hands often and wear a mask.  Sleep in a different bedroom or bed, if possible.  Do not share personal items, such as razors, toothbrushes, deodorant, combs, brushes, towels, and washcloths. Where to find more information Centers for Disease Control and Prevention (CDC)  Information and news updates: www.cdc.gov/coronavirus/2019-ncov World Health Organization (WHO)  Information and news updates: www.who.int/emergencies/diseases/novel-coronavirus-2019  Coronavirus health topic: www.who.int/health-topics/coronavirus  Questions and answers on COVID-19: www.who.int/news-room/q-a-detail/q-a-coronaviruses  Global  tracker: who.sprinklr.com American Academy of Pediatrics (AAP)  Information for families: www.healthychildren.org/English/health-issues/conditions/chest-lungs/Pages/2019-Novel-Coronavirus.aspx The coronavirus situation is changing rapidly. Check your local health authority website or the CDC and WHO websites for updates and news. When should I contact a health care provider?  Contact your health care provider if you have symptoms of an infection, such as fever or cough, and you: ? Have been near anyone who is known to have coronavirus disease. ? Have come into contact with a person who is suspected to have coronavirus disease. ? Have traveled to an area where there is an outbreak of COVID-19. When should I get emergency medical care?  Get help right away by calling your local emergency services (911 in the [place] if you have: ? Trouble breathing. ? Pain or pressure in your chest. ? Confusion. ? Blue-tinged lips and fingernails. ? Difficulty waking from sleep. ? Symptoms that get worse. Let the emergency medical personnel know if you think you have coronavirus disease. Summary  A new respiratory virus is spreading from person to person and causing   COVID-19 (coronavirus disease).  The virus that causes COVID-19 appears to spread easily. It spreads from one person to another through droplets from breathing, speaking, singing, coughing, or sneezing.  Older adults and those with chronic diseases are at higher risk of disease. If you are at higher risk for complications, take extra precautions.  There is currently no vaccine to prevent coronavirus disease. There are no medicines, such as antibiotics or antivirals, to treat the virus.  You can protect yourself and your family by washing your hands often, avoiding touching your face, and covering your coughs and sneezes. This information is not intended to replace advice given to you by your health care provider. Make sure you discuss any  questions you have with your health care provider. Document Revised: 04/04/2019 Document Reviewed: 10/01/2018 Elsevier Patient Education  [employer] Can Do to Manage Your COVID-19 Symptoms at Home If you have possible or confirmed COVID-19: 1. Stay home from work and school. And stay away from other public places. If you must go out, avoid using any kind of public transportation, ridesharing, or taxis. 2. Monitor your symptoms carefully. If your symptoms get worse, call your healthcare provider immediately. 3. Get rest and stay hydrated. 4. If you have a medical appointment, call the healthcare provider ahead of time and tell them that you have or may have COVID-19. 5. For medical emergencies, call 911 and notify the dispatch personnel that you have or may have COVID-19. 6. Cover your cough and sneezes with a tissue or use the inside of your elbow. 7. Wash your hands often with soap and water for at least 20 seconds or clean your hands with an alcohol-based hand sanitizer that contains at least 60% alcohol. 8. As much as possible, stay in a specific room and away from other people in your home. Also, you should use a separate bathroom, if available. If you need to be around other people in or outside of the home, wear a mask. 9. Avoid sharing personal items with other people in your household, like dishes, towels, and bedding. 10. Clean all surfaces that are touched often, like counters, tabletops, and doorknobs. Use household cleaning sprays or wipes according to the label instructions. [email or web address] 12/18/2018 This information is not intended to replace advice given to you by your health care provider. Make sure you discuss any questions you have with your health care provider. Document Revised: 05/22/2019 Document Reviewed: 05/22/2019 Elsevier Patient Education  2020 [employer].   COVID-19 COVID-19 is a respiratory infection that is caused by a virus called  severe acute respiratory syndrome coronavirus 2 (SARS-CoV-2). The disease is also known as coronavirus disease or novel coronavirus. In some people, the virus may not cause any symptoms. In others, it may cause a serious infection. The infection can get worse quickly and can lead to complications, such as:  Pneumonia, or infection of the lungs.  Acute respiratory distress syndrome or ARDS. This is a condition in which fluid build-up in the lungs prevents the lungs from filling with air and passing oxygen into the blood.  Acute respiratory failure. This is a condition in which there is not enough oxygen passing from the lungs to the body or when carbon dioxide is not passing from the lungs out of the body.  Sepsis or septic shock. This is a serious bodily reaction to an infection.  Blood clotting problems.  Secondary infections due to bacteria or fungus.  Organ failure. This is when your  body's organs stop working. The virus that causes COVID-19 is contagious. This means that it can spread from person to person through droplets from coughs and sneezes (respiratory secretions). What are the causes? This illness is caused by a virus. You may catch the virus by:  Breathing in droplets from an infected person. Droplets can be spread by a person breathing, speaking, singing, coughing, or sneezing.  Touching something, like a table or a doorknob, that was exposed to the virus (contaminated) and then touching your mouth, nose, or eyes. What increases the risk? Risk for infection You are more likely to be infected with this virus if you:  Are within 6 feet (2 meters) of a person with COVID-19.  Provide care for or live with a person who is infected with COVID-19.  Spend time in crowded indoor spaces or live in shared housing. Risk for serious illness You are more likely to become seriously ill from the virus if you:  Are 5 years of age or older. The higher your age, the more you are at risk  for serious illness.  Live in a nursing home or long-term care facility.  Have cancer.  Have a long-term (chronic) disease such as: ? Chronic lung disease, including chronic obstructive pulmonary disease or asthma. ? A long-term disease that lowers your body's ability to fight infection (immunocompromised). ? Heart disease, including heart failure, a condition in which the arteries that lead to the heart become narrow or blocked (coronary artery disease), a disease which makes the heart muscle thick, weak, or stiff (cardiomyopathy). ? Diabetes. ? Chronic kidney disease. ? Sickle cell disease, a condition in which red blood cells have an abnormal "sickle" shape. ? Liver disease.  Are obese. What are the signs or symptoms? Symptoms of this condition can range from mild to severe. Symptoms may appear any time from 2 to 14 days after being exposed to the virus. They include:  A fever or chills.  A cough.  Difficulty breathing.  Headaches, body aches, or muscle aches.  Runny or stuffy (congested) nose.  A sore throat.  New loss of taste or smell. Some people may also have stomach problems, such as nausea, vomiting, or diarrhea. Other people may not have any symptoms of COVID-19. How is this diagnosed? This condition may be diagnosed based on:  Your signs and symptoms, especially if: ? You live in an area with a COVID-19 outbreak. ? You recently traveled to or from an area where the virus is common. ? You provide care for or live with a person who was diagnosed with COVID-19. ? You were exposed to a person who was diagnosed with COVID-19.  A physical exam.  Lab tests, which may include: ? Taking a sample of fluid from the back of your nose and throat (nasopharyngeal fluid), your nose, or your throat using a swab. ? A sample of mucus from your lungs (sputum). ? Blood tests.  Imaging tests, which may include, X-rays, CT scan, or ultrasound. How is this treated? At present,  there is no medicine to treat COVID-19. Medicines that treat other diseases are being used on a trial basis to see if they are effective against COVID-19. Your health care provider will talk with you about ways to treat your symptoms. For most people, the infection is mild and can be managed at home with rest, fluids, and over-the-counter medicines. Treatment for a serious infection usually takes places in a hospital intensive care unit (ICU). It may include one or  more of the following treatments. These treatments are given until your symptoms improve.  Receiving fluids and medicines through an IV.  Supplemental oxygen. Extra oxygen is given through a tube in the nose, a face mask, or a hood.  Positioning you to lie on your stomach (prone position). This makes it easier for oxygen to get into the lungs.  Continuous positive airway pressure (CPAP) or bi-level positive airway pressure (BPAP) machine. This treatment uses mild air pressure to keep the airways open. A tube that is connected to a motor delivers oxygen to the body.  Ventilator. This treatment moves air into and out of the lungs by using a tube that is placed in your windpipe.  Tracheostomy. This is a procedure to create a hole in the neck so that a breathing tube can be inserted.  Extracorporeal membrane oxygenation (ECMO). This procedure gives the lungs a chance to recover by taking over the functions of the heart and lungs. It supplies oxygen to the body and removes carbon dioxide. Follow these instructions at home: Lifestyle  If you are sick, stay home except to get medical care. Your health care provider will tell you how long to stay home. Call your health care provider before you go for medical care.  Rest at home as told by your health care provider.  Do not use any products that contain nicotine or tobacco, such as cigarettes, e-cigarettes, and chewing tobacco. If you need help quitting, ask your health care  provider.  Return to your normal activities as told by your health care provider. Ask your health care provider what activities are safe for you. General instructions  Take over-the-counter and prescription medicines only as told by your health care provider.  Drink enough fluid to keep your urine pale yellow.  Keep all follow-up visits as told by your health care provider. This is important. How is this prevented?  There is no vaccine to help prevent COVID-19 infection. However, there are steps you can take to protect yourself and others from this virus. To protect yourself:   Do not travel to areas where COVID-19 is a risk. The areas where COVID-19 is reported change often. To identify high-risk areas and travel restrictions, check the CDC travel website: FatFares.com.br  If you live in, or must travel to, an area where COVID-19 is a risk, take precautions to avoid infection. ? Stay away from people who are sick. ? Wash your hands often with soap and water for 20 seconds. If soap and water are not available, use an alcohol-based hand sanitizer. ? Avoid touching your mouth, face, eyes, or nose. ? Avoid going out in public, follow guidance from your state and local health authorities. ? If you must go out in public, wear a cloth face covering or face mask. Make sure your mask covers your nose and mouth. ? Avoid crowded indoor spaces. Stay at least 6 feet (2 meters) away from others. ? Disinfect objects and surfaces that are frequently touched every day. This may include:  Counters and tables.  Doorknobs and light switches.  Sinks and faucets.  Electronics, such as phones, remote controls, keyboards, computers, and tablets. To protect others: If you have symptoms of COVID-19, take steps to prevent the virus from spreading to others.  If you think you have a COVID-19 infection, contact your health care provider right away. Tell your health care team that you think you  may have a COVID-19 infection.  Stay home. Leave your house only to seek  medical care. Do not use public transport.  Do not travel while you are sick.  Wash your hands often with soap and water for 20 seconds. If soap and water are not available, use alcohol-based hand sanitizer.  Stay away from other members of your household. Let healthy household members care for children and pets, if possible. If you have to care for children or pets, wash your hands often and wear a mask. If possible, stay in your own room, separate from others. Use a different bathroom.  Make sure that all people in your household wash their hands well and often.  Cough or sneeze into a tissue or your sleeve or elbow. Do not cough or sneeze into your hand or into the air.  Wear a cloth face covering or face mask. Make sure your mask covers your nose and mouth. Where to find more information  Centers for Disease Control and Prevention: PurpleGadgets.be  World Health Organization: https://www.castaneda.info/ Contact a health care provider if:  You live in or have traveled to an area where COVID-19 is a risk and you have symptoms of the infection.  You have had contact with someone who has COVID-19 and you have symptoms of the infection. Get help right away if:  You have trouble breathing.  You have pain or pressure in your chest.  You have confusion.  You have bluish lips and fingernails.  You have difficulty waking from sleep.  You have symptoms that get worse. These symptoms may represent a serious problem that is an emergency. Do not wait to see if the symptoms will go away. Get medical help right away. Call your local emergency services (911 in the [place] Do not drive yourself to the hospital. Let the emergency medical personnel know if you think you have COVID-19. Summary  COVID-19 is a respiratory infection that is caused by a virus. It is also known as  coronavirus disease or novel coronavirus. It can cause serious infections, such as pneumonia, acute respiratory distress syndrome, acute respiratory failure, or sepsis.  The virus that causes COVID-19 is contagious. This means that it can spread from person to person through droplets from breathing, speaking, singing, coughing, or sneezing.  You are more likely to develop a serious illness if you are 48 years of age or older, have a weak immune system, live in a nursing home, or have chronic disease.  There is no medicine to treat COVID-19. Your health care provider will talk with you about ways to treat your symptoms.  Take steps to protect yourself and others from infection. Wash your hands often and disinfect objects and surfaces that are frequently touched every day. Stay away from people who are sick and wear a mask if you are sick. This information is not intended to replace advice given to you by your health care provider. Make sure you discuss any questions you have with your health care provider. Document Revised: 04/04/2019 Document Reviewed: 07/11/2018 Elsevier Patient Education  2020 [place].  COVID-19: How to Protect Yourself and Others Know how it spreads  There is currently no vaccine to prevent coronavirus disease 2019 (COVID-19).  The best way to prevent illness is to avoid being exposed to this virus.  The virus is thought to spread mainly from person-to-person. ? Between people who are in close contact with one another (within about 6 feet). ? Through respiratory droplets produced when an infected person coughs, sneezes or talks. ? These droplets can land in the mouths or noses  of people who are nearby or possibly be inhaled into the lungs. ? COVID-19 may be spread by people who are not showing symptoms. Everyone should Clean your hands often  Wash your hands often with soap and water for at least 20 seconds especially after you have been in a public place, or  after blowing your nose, coughing, or sneezing.  If soap and water are not readily available, use a hand sanitizer that contains at least 60% alcohol. Cover all surfaces of your hands and rub them together until they feel dry.  Avoid touching your eyes, nose, and mouth with unwashed hands. Avoid close contact  Limit contact with others as much as possible.  Avoid close contact with people who are sick.  Put distance between yourself and other people. ? Remember that some people without symptoms may be able to spread virus. ? This is especially important for people who are at higher risk of getting very [email or web address] Cover your mouth and nose with a mask when around others  You could spread COVID-19 to others even if you do not feel sick.  Everyone should wear a mask in public settings and when around people not living in their household, especially when social distancing is difficult to maintain. ? Masks should not be placed on young children under age 21, anyone who has trouble breathing, or is unconscious, incapacitated or otherwise unable to remove the mask without assistance.  The mask is meant to protect other people in case you are infected.  Do NOT use a facemask meant for a Dietitian.  Continue to keep about 6 feet between yourself and others. The mask is not a substitute for social distancing. Cover coughs and sneezes  Always cover your mouth and nose with a tissue when you cough or sneeze or use the inside of your elbow.  Throw used tissues in the trash.  Immediately wash your hands with soap and water for at least 20 seconds. If soap and water are not readily available, clean your hands with a hand sanitizer that contains at least 60% alcohol. Clean and disinfect  Clean AND disinfect frequently touched surfaces daily. This includes tables, doorknobs, light switches, countertops, handles,  desks, phones, keyboards, toilets, faucets, and sinks. [email or web address]  If surfaces are dirty, clean them: Use detergent or soap and water prior to disinfection.  Then, use a household disinfectant. You can see a list of EPA-registered household disinfectants here. [email or web address] 02/19/2019 This information is not intended to replace advice given to you by your health care provider. Make sure you discuss any questions you have with your health care provider. Document Revised: 02/27/2019 Document Reviewed: 12/26/2018 Elsevier Patient Education  [place] Under Monitoring Name: [name]  Location: [place] [id number]   Infection Prevention Recommendations for Individuals Confirmed to have, or Being Evaluated for, 2019 Novel Coronavirus (COVID-19) Infection Who Receive Care at Home  Individuals who are confirmed to have, or are being evaluated for, COVID-19 should follow the prevention steps below until a healthcare provider or local or state health department says they can return to normal activities.  Stay home except to get medical care You should restrict activities outside your home, except for getting medical care. Do not go to work, school, or public areas, and do not use public transportation or taxis.  Call ahead before visiting your doctor Before your medical appointment, call the healthcare provider and tell them that you have, or  are being evaluated for, COVID-19 infection. This will help the healthcare provider's office take steps to keep other people from getting infected. Ask your healthcare provider to call the local or state health department.  Monitor your symptoms Seek prompt medical attention if your illness is worsening (e.g., difficulty breathing). Before going to your medical appointment, call the healthcare provider and tell them that you have,  or are being evaluated for, COVID-19 infection. Ask your healthcare provider to call the local or state health department.  Wear a facemask You should wear a facemask that covers your nose and mouth when you are in the same room with other people and when you visit a healthcare provider. People who live with or visit you should also wear a facemask while they are in the same room with you.  Separate yourself from other people in your home As much as possible, you should stay in a different room from other people in your home. Also, you should use a separate bathroom, if available.  Avoid sharing household items You should not share dishes, drinking glasses, cups, eating utensils, towels, bedding, or other items with other people in your home. After using these items, you should wash them thoroughly with soap and water.  Cover your coughs and sneezes Cover your mouth and nose with a tissue when you cough or sneeze, or you can cough or sneeze into your sleeve. Throw used tissues in a lined trash can, and immediately wash your hands with soap and water for at least 20 seconds or use an alcohol-based hand rub.  Wash your [facility] your hands often and thoroughly with soap and water for at least 20 seconds. You can use an alcohol-based hand sanitizer if soap and water are not available and if your hands are not visibly dirty. Avoid touching your eyes, nose, and mouth with unwashed hands.   Prevention Steps for Caregivers and Household Members of Individuals Confirmed to have, or Being Evaluated for, COVID-19 Infection Being Cared for in the Home  If you live with, or provide care at home for, a person confirmed to have, or being evaluated for, COVID-19 infection please follow these guidelines to prevent infection:  Follow healthcare provider's instructions Make sure that you understand and can help the patient follow any healthcare provider instructions for all care.  Provide for the  patient's basic needs You should help the patient with basic needs in the home and provide support for getting groceries, prescriptions, and other personal needs.  Monitor the patient's symptoms If they are getting sicker, call his or her medical provider and tell them that the patient has, or is being evaluated for, COVID-19 infection. This will help the healthcare provider's office take steps to keep other people from getting infected. Ask the healthcare provider to call the local or state health department.  Limit the number of people who have contact with the patient  If possible, have only one caregiver for the patient.  Other household members should stay in another home or place of residence. If this is not possible, they should stay  in another room, or be separated from the patient as much as possible. Use a separate bathroom, if available.  Restrict visitors who do not have an essential need to be in the home.  Keep older adults, very young children, and other sick people away from the patient Keep older adults, very young children, and those who have compromised immune systems or chronic health conditions away from the patient.  This includes people with chronic heart, lung, or kidney conditions, diabetes, and cancer.  Ensure good ventilation Make sure that shared spaces in the home have good air flow, such as from an air conditioner or an opened window, weather permitting.  Wash your hands often  Wash your hands often and thoroughly with soap and water for at least 20 seconds. You can use an alcohol based hand sanitizer if soap and water are not available and if your hands are not visibly dirty.  Avoid touching your eyes, nose, and mouth with unwashed hands.  Use disposable paper towels to dry your hands. If not available, use dedicated cloth towels and replace them when they become wet.  Wear a facemask and gloves  Wear a disposable facemask at all times in the room  and gloves when you touch or have contact with the patient's blood, body fluids, and/or secretions or excretions, such as sweat, saliva, sputum, nasal mucus, vomit, urine, or feces.  Ensure the mask fits over your nose and mouth tightly, and do not touch it during use.  Throw out disposable facemasks and gloves after using them. Do not reuse.  Wash your hands immediately after removing your facemask and gloves.  If your personal clothing becomes contaminated, carefully remove clothing and launder. Wash your hands after handling contaminated clothing.  Place all used disposable facemasks, gloves, and other waste in a lined container before disposing them with other household waste.  Remove gloves and wash your hands immediately after handling these items.  Do not share dishes, glasses, or other household items with the patient  Avoid sharing household items. You should not share dishes, drinking glasses, cups, eating utensils, towels, bedding, or other items with a patient who is confirmed to have, or being evaluated for, COVID-19 infection.  After the person uses these items, you should wash them thoroughly with soap and water.  Wash laundry thoroughly  Immediately remove and wash clothes or bedding that have blood, body fluids, and/or secretions or excretions, such as sweat, saliva, sputum, nasal mucus, vomit, urine, or feces, on them.  Wear gloves when handling laundry from the patient.  Read and follow directions on labels of laundry or clothing items and detergent. In general, wash and dry with the warmest temperatures recommended on the label.  Clean all areas the individual has used often  Clean all touchable surfaces, such as counters, tabletops, doorknobs, bathroom fixtures, toilets, phones, keyboards, tablets, and bedside tables, every day. Also, clean any surfaces that may have blood, body fluids, and/or secretions or excretions on them.  Wear gloves when cleaning surfaces the  patient has come in contact with.  Use a diluted bleach solution (e.g., dilute bleach with 1 part bleach and 10 parts water) or a household disinfectant with a label that says EPA-registered for coronaviruses. To make a bleach solution at home, add 1 tablespoon of bleach to 1 quart (4 cups) of water. For a larger supply, add  cup of bleach to 1 gallon (16 cups) of water.  Read labels of cleaning products and follow recommendations provided on product labels. Labels contain instructions for safe and effective use of the cleaning product including precautions you should take when applying the product, such as wearing gloves or eye protection and making sure you have good ventilation during use of the product.  Remove gloves and wash hands immediately after cleaning.  Monitor yourself for signs and symptoms of illness Caregivers and household members are considered close contacts, should monitor their health,  and will be asked to limit movement outside of the home to the extent possible. Follow the monitoring steps for close contacts listed on the symptom monitoring form.   ? If you have additional questions, contact your local health department or call the epidemiologist on call at [phone] (available 24/7). ? This guidance is subject to change. For the most up-to-date guidance from [facility], please refer to their website: [email or web address]

## 2020-02-24 NOTE — Plan of Care (Signed)
  Problem: Pain Managment: Goal: General experience of comfort will improve Outcome: Progressing   Problem: Respiratory: Goal: Will maintain a patent airway Outcome: Progressing   Problem: Elimination: Goal: Will not experience complications related to bowel motility Outcome: Progressing

## 2020-02-27 ENCOUNTER — Observation Stay (HOSPITAL_COMMUNITY)
Admission: EM | Admit: 2020-02-27 | Discharge: 2020-03-05 | Disposition: A | Discharge status: Home / Self Care | Payer: Medicare Other | Attending: Internal Medicine | Admitting: Internal Medicine

## 2020-02-27 ENCOUNTER — Emergency Department (HOSPITAL_COMMUNITY): Payer: Medicare Other

## 2020-02-27 ENCOUNTER — Encounter (HOSPITAL_COMMUNITY): Payer: Self-pay | Admitting: Emergency Medicine

## 2020-02-27 DIAGNOSIS — N319 Neuromuscular dysfunction of bladder, unspecified: Secondary | ICD-10-CM | POA: Insufficient documentation

## 2020-02-27 DIAGNOSIS — Z7982 Long term (current) use of aspirin: Secondary | ICD-10-CM | POA: Insufficient documentation

## 2020-02-27 DIAGNOSIS — U071 COVID-19: Secondary | ICD-10-CM | POA: Diagnosis not present

## 2020-02-27 DIAGNOSIS — Z79890 Hormone replacement therapy (postmenopausal): Secondary | ICD-10-CM | POA: Diagnosis not present

## 2020-02-27 DIAGNOSIS — Z79899 Other long term (current) drug therapy: Secondary | ICD-10-CM | POA: Insufficient documentation

## 2020-02-27 DIAGNOSIS — Z978 Presence of other specified devices: Secondary | ICD-10-CM

## 2020-02-27 DIAGNOSIS — E039 Hypothyroidism, unspecified: Secondary | ICD-10-CM | POA: Diagnosis present

## 2020-02-27 DIAGNOSIS — E118 Type 2 diabetes mellitus with unspecified complications: Secondary | ICD-10-CM | POA: Diagnosis present

## 2020-02-27 DIAGNOSIS — R532 Functional quadriplegia: Secondary | ICD-10-CM | POA: Diagnosis present

## 2020-02-27 DIAGNOSIS — J9611 Chronic respiratory failure with hypoxia: Secondary | ICD-10-CM | POA: Diagnosis not present

## 2020-02-27 DIAGNOSIS — J1282 Pneumonia due to coronavirus disease 2019: Secondary | ICD-10-CM | POA: Diagnosis not present

## 2020-02-27 DIAGNOSIS — R531 Weakness: Secondary | ICD-10-CM | POA: Diagnosis not present

## 2020-02-27 DIAGNOSIS — I110 Hypertensive heart disease with heart failure: Secondary | ICD-10-CM | POA: Diagnosis not present

## 2020-02-27 DIAGNOSIS — J449 Chronic obstructive pulmonary disease, unspecified: Secondary | ICD-10-CM | POA: Diagnosis not present

## 2020-02-27 DIAGNOSIS — I5032 Chronic diastolic (congestive) heart failure: Secondary | ICD-10-CM | POA: Diagnosis not present

## 2020-02-27 DIAGNOSIS — E119 Type 2 diabetes mellitus without complications: Secondary | ICD-10-CM | POA: Diagnosis not present

## 2020-02-27 LAB — CBC WITH DIFFERENTIAL/PLATELET
Abs Immature Granulocytes: 0.04 10*3/uL (ref 0.00–0.07)
Basophils Absolute: 0 10*3/uL (ref 0.0–0.1)
Basophils Relative: 0 %
Eosinophils Absolute: 0.1 10*3/uL (ref 0.0–0.5)
Eosinophils Relative: 1 %
HCT: 43.9 % (ref 39.0–52.0)
Hemoglobin: 14.2 g/dL (ref 13.0–17.0)
Immature Granulocytes: 1 %
Lymphocytes Relative: 14 %
Lymphs Abs: 1.2 10*3/uL (ref 0.7–4.0)
MCH: 27 pg (ref 26.0–34.0)
MCHC: 32.3 g/dL (ref 30.0–36.0)
MCV: 83.6 fL (ref 80.0–100.0)
Monocytes Absolute: 0.8 10*3/uL (ref 0.1–1.0)
Monocytes Relative: 9 %
Neutro Abs: 6.6 10*3/uL (ref 1.7–7.7)
Neutrophils Relative %: 75 %
Platelets: 309 10*3/uL (ref 150–400)
RBC: 5.25 MIL/uL (ref 4.22–5.81)
RDW: 15.9 % — ABNORMAL HIGH (ref 11.5–15.5)
WBC: 8.7 10*3/uL (ref 4.0–10.5)
nRBC: 0 % (ref 0.0–0.2)

## 2020-02-27 LAB — GLUCOSE, CAPILLARY: Glucose-Capillary: 88 mg/dL (ref 70–99)

## 2020-02-27 LAB — BASIC METABOLIC PANEL
Anion gap: 12 (ref 5–15)
BUN: 29 mg/dL — ABNORMAL HIGH (ref 8–23)
CO2: 30 mmol/L (ref 22–32)
Calcium: 8.5 mg/dL — ABNORMAL LOW (ref 8.9–10.3)
Chloride: 95 mmol/L — ABNORMAL LOW (ref 98–111)
Creatinine, Ser: 0.79 mg/dL (ref 0.61–1.24)
GFR calc Af Amer: >60 mL/min (ref >60)
GFR calc non Af Amer: >60 mL/min (ref >60)
Glucose, Bld: 87 mg/dL (ref 70–99)
Potassium: 4.4 mmol/L (ref 3.5–5.1)
Sodium: 137 mmol/L (ref 135–145)

## 2020-02-27 IMAGING — DX DG CHEST 1V PORT
1 series · 1 of 1 positions shown · non-contrast
Comparison: [DATE] chest radiograph and prior.

CLINICAL DATA: COVID, shortness of breath.

EXAM:
PORTABLE CHEST 1 VIEW

[chest ap]
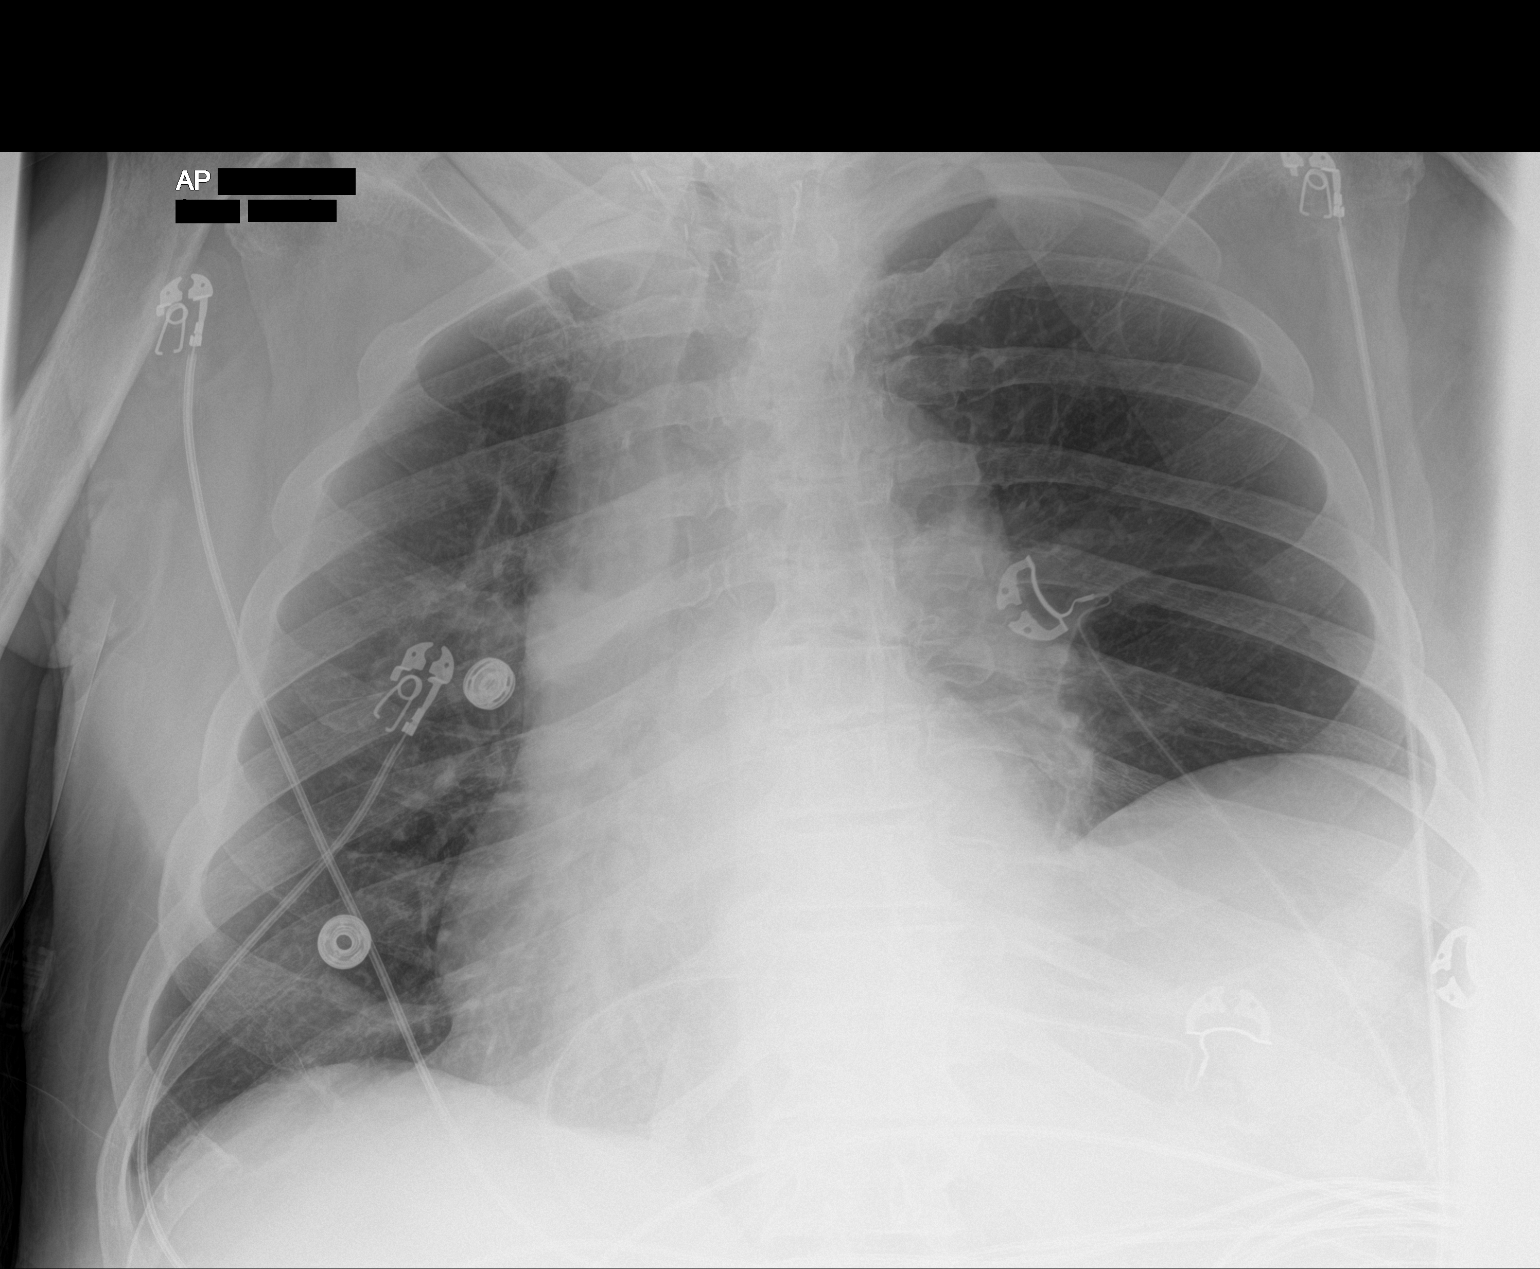

[1 of 1 positions shown; findings below may reference images not displayed]

FINDINGS: Prior right upper lung infiltrate is less conspicuous than prior
exam. No new focal opacity. No pneumothorax or pleural effusion.
Cardiomediastinal silhouette is unchanged. No acute osseous
abnormality.
IMPRESSION: Decreased conspicuity of right upper lung opacities.

No new focal opacities.

## 2020-02-27 MED ORDER — COLCHICINE 0.6 MG PO TABS
0.6000 mg | ORAL_TABLET | Freq: Every day | ORAL | Status: DC
  Administered 2020-02-28 – 2020-03-05 (×7): 0.6 mg via ORAL
  Filled 2020-02-27 (×6): qty 1

## 2020-02-27 MED ORDER — ATORVASTATIN CALCIUM 20 MG PO TABS
20.0000 mg | ORAL_TABLET | Freq: Every day | ORAL | Status: DC
  Administered 2020-02-28 – 2020-03-05 (×7): 20 mg via ORAL
  Filled 2020-02-27 (×7): qty 1

## 2020-02-27 MED ORDER — ENOXAPARIN SODIUM 40 MG/0.4ML ~~LOC~~ SOLN
40.0000 mg | SUBCUTANEOUS | Status: DC
  Administered 2020-02-27 – 2020-03-04 (×7): 40 mg via SUBCUTANEOUS
  Filled 2020-02-27 (×7): qty 0.4

## 2020-02-27 MED ORDER — SODIUM CHLORIDE 0.9 % IV SOLN: 250.0000 mL | INTRAVENOUS | Status: DC | PRN

## 2020-02-27 MED ORDER — SUMATRIPTAN SUCCINATE 50 MG PO TABS
50.0000 mg | ORAL_TABLET | ORAL | Status: DC | PRN
  Filled 2020-02-27: qty 1

## 2020-02-27 MED ORDER — UMECLIDINIUM BROMIDE 62.5 MCG/INH IN AEPB
1.0000 | INHALATION_SPRAY | Freq: Every day | RESPIRATORY_TRACT | Status: DC
  Administered 2020-03-03 – 2020-03-05 (×3): 1 via RESPIRATORY_TRACT
  Filled 2020-02-27: qty 7

## 2020-02-27 MED ORDER — POLYETHYLENE GLYCOL 3350 17 G PO PACK
17.0000 g | PACK | ORAL | Status: DC
  Administered 2020-02-28: 17 g via ORAL
  Filled 2020-02-27: qty 1

## 2020-02-27 MED ORDER — GUAIFENESIN ER 600 MG PO TB12
1200.0000 mg | ORAL_TABLET | Freq: Two times a day (BID) | ORAL | Status: DC
  Administered 2020-02-27 – 2020-02-28 (×3): 1200 mg via ORAL
  Administered 2020-02-29: 600 mg via ORAL
  Administered 2020-02-29 – 2020-03-03 (×6): 1200 mg via ORAL
  Administered 2020-03-03: 600 mg via ORAL
  Administered 2020-03-04 – 2020-03-05 (×3): 1200 mg via ORAL
  Filled 2020-02-27 (×14): qty 2

## 2020-02-27 MED ORDER — ACETAMINOPHEN 325 MG PO TABS: 650.0000 mg | ORAL_TABLET | Freq: Four times a day (QID) | ORAL | Status: DC | PRN

## 2020-02-27 MED ORDER — ASCORBIC ACID 500 MG PO TABS
500.0000 mg | ORAL_TABLET | Freq: Every day | ORAL | Status: DC
  Administered 2020-02-28 – 2020-03-05 (×7): 500 mg via ORAL
  Filled 2020-02-27 (×7): qty 1

## 2020-02-27 MED ORDER — GABAPENTIN 300 MG PO CAPS
300.0000 mg | ORAL_CAPSULE | Freq: Every day | ORAL | Status: DC
  Administered 2020-02-27 – 2020-03-04 (×7): 300 mg via ORAL
  Filled 2020-02-27 (×7): qty 1

## 2020-02-27 MED ORDER — HYDROCODONE-ACETAMINOPHEN 5-325 MG PO TABS
1.0000 | ORAL_TABLET | Freq: Four times a day (QID) | ORAL | Status: DC | PRN
  Administered 2020-02-27 – 2020-03-05 (×10): 1 via ORAL
  Filled 2020-02-27 (×10): qty 1

## 2020-02-27 MED ORDER — FLUTICASONE-UMECLIDIN-VILANT 100-62.5-25 MCG/INH IN AEPB: 1.0000 | INHALATION_SPRAY | Freq: Every day | RESPIRATORY_TRACT | Status: DC

## 2020-02-27 MED ORDER — FUROSEMIDE 40 MG PO TABS
40.0000 mg | ORAL_TABLET | Freq: Every day | ORAL | Status: DC
  Administered 2020-02-28 – 2020-03-05 (×7): 40 mg via ORAL
  Filled 2020-02-27 (×7): qty 1

## 2020-02-27 MED ORDER — FLUTICASONE FUROATE-VILANTEROL 100-25 MCG/INH IN AEPB
1.0000 | INHALATION_SPRAY | Freq: Every day | RESPIRATORY_TRACT | Status: DC
  Administered 2020-03-01 – 2020-03-05 (×4): 1 via RESPIRATORY_TRACT
  Filled 2020-02-27: qty 28

## 2020-02-27 MED ORDER — SODIUM CHLORIDE 0.9% FLUSH
3.0000 mL | INTRAVENOUS | Status: DC | PRN
  Administered 2020-02-27: 3 mL via INTRAVENOUS

## 2020-02-27 MED ORDER — METHOCARBAMOL 500 MG PO TABS
750.0000 mg | ORAL_TABLET | Freq: Four times a day (QID) | ORAL | Status: DC | PRN
  Administered 2020-02-28 – 2020-03-04 (×6): 750 mg via ORAL
  Filled 2020-02-27 (×6): qty 2

## 2020-02-27 MED ORDER — ASPIRIN EC 325 MG PO TBEC
325.0000 mg | DELAYED_RELEASE_TABLET | Freq: Every day | ORAL | Status: DC
  Administered 2020-02-28 – 2020-03-05 (×7): 325 mg via ORAL
  Filled 2020-02-27 (×7): qty 1

## 2020-02-27 MED ORDER — CALCIUM CITRATE 950 (200 CA) MG PO TABS
200.0000 mg | ORAL_TABLET | Freq: Every day | ORAL | Status: DC
  Administered 2020-02-28 – 2020-03-05 (×7): 200 mg via ORAL
  Filled 2020-02-27 (×7): qty 1

## 2020-02-27 MED ORDER — ONDANSETRON HCL 4 MG/2ML IJ SOLN: 4.0000 mg | Freq: Four times a day (QID) | INTRAMUSCULAR | Status: DC | PRN

## 2020-02-27 MED ORDER — PANTOPRAZOLE SODIUM 40 MG PO TBEC
40.0000 mg | DELAYED_RELEASE_TABLET | Freq: Every evening | ORAL | Status: DC
  Administered 2020-02-28 – 2020-03-05 (×7): 40 mg via ORAL
  Filled 2020-02-27 (×7): qty 1

## 2020-02-27 MED ORDER — ONDANSETRON HCL 4 MG PO TABS: 4.0000 mg | ORAL_TABLET | Freq: Four times a day (QID) | ORAL | Status: DC | PRN

## 2020-02-27 MED ORDER — SENNOSIDES-DOCUSATE SODIUM 8.6-50 MG PO TABS
2.0000 | ORAL_TABLET | Freq: Every day | ORAL | Status: DC
  Administered 2020-02-27 – 2020-03-04 (×7): 2 via ORAL
  Filled 2020-02-27 (×7): qty 2

## 2020-02-27 MED ORDER — ADULT MULTIVITAMIN W/MINERALS CH
1.0000 | ORAL_TABLET | Freq: Every day | ORAL | Status: DC
  Administered 2020-02-28 – 2020-03-05 (×7): 1 via ORAL
  Filled 2020-02-27 (×7): qty 1

## 2020-02-27 MED ORDER — VITAMIN D 25 MCG (1000 UNIT) PO TABS
1000.0000 [IU] | ORAL_TABLET | Freq: Every day | ORAL | Status: DC
  Administered 2020-02-28 – 2020-03-05 (×8): 1000 [IU] via ORAL
  Filled 2020-02-27 (×14): qty 1

## 2020-02-27 MED ORDER — NITROGLYCERIN 0.4 MG SL SUBL: 0.4000 mg | SUBLINGUAL_TABLET | SUBLINGUAL | Status: DC | PRN

## 2020-02-27 MED ORDER — LATANOPROST 0.005 % OP SOLN
1.0000 [drp] | Freq: Every day | OPHTHALMIC | Status: DC
  Administered 2020-02-27 – 2020-03-04 (×7): 1 [drp] via OPHTHALMIC
  Filled 2020-02-27: qty 2.5

## 2020-02-27 MED ORDER — ALBUTEROL SULFATE HFA 108 (90 BASE) MCG/ACT IN AERS
1.0000 | INHALATION_SPRAY | RESPIRATORY_TRACT | Status: DC | PRN
  Filled 2020-02-27: qty 6.7

## 2020-02-27 MED ORDER — SODIUM CHLORIDE 0.9% FLUSH: 3.0000 mL | Freq: Two times a day (BID) | INTRAVENOUS | Status: DC

## 2020-02-27 MED ORDER — BACLOFEN 20 MG PO TABS
40.0000 mg | ORAL_TABLET | Freq: Four times a day (QID) | ORAL | Status: DC
  Administered 2020-02-27 – 2020-03-05 (×28): 40 mg via ORAL
  Filled 2020-02-27 (×16): qty 2
  Filled 2020-02-27: qty 4
  Filled 2020-02-27 (×12): qty 2

## 2020-02-27 MED ORDER — METOPROLOL SUCCINATE ER 25 MG PO TB24
25.0000 mg | ORAL_TABLET | Freq: Every day | ORAL | Status: DC
  Administered 2020-02-28 – 2020-03-05 (×7): 25 mg via ORAL
  Filled 2020-02-27 (×7): qty 1

## 2020-02-27 MED ORDER — INSULIN ASPART 100 UNIT/ML ~~LOC~~ SOLN
0.0000 [IU] | Freq: Three times a day (TID) | SUBCUTANEOUS | Status: DC
  Administered 2020-02-29: 2 [IU] via SUBCUTANEOUS

## 2020-02-27 MED ORDER — SPIRONOLACTONE 25 MG PO TABS: 25.0000 mg | ORAL_TABLET | Freq: Every evening | ORAL | Status: DC

## 2020-02-27 MED ORDER — LEVOTHYROXINE SODIUM 75 MCG PO TABS
175.0000 ug | ORAL_TABLET | Freq: Every day | ORAL | Status: DC
  Administered 2020-02-28 – 2020-03-05 (×6): 175 ug via ORAL
  Filled 2020-02-27 (×7): qty 1

## 2020-02-27 MED ORDER — ZINC SULFATE 220 (50 ZN) MG PO CAPS
220.0000 mg | ORAL_CAPSULE | Freq: Every day | ORAL | Status: DC
  Administered 2020-02-27 – 2020-03-05 (×8): 220 mg via ORAL
  Filled 2020-02-27 (×8): qty 1

## 2020-02-27 MED ORDER — DOCUSATE SODIUM 100 MG PO CAPS: 100.0000 mg | ORAL_CAPSULE | Freq: Two times a day (BID) | ORAL | Status: DC | PRN

## 2020-02-27 MED ORDER — PREDNISONE 20 MG PO TABS: 10.0000 mg | ORAL_TABLET | Freq: Every day | ORAL | Status: DC

## 2020-02-27 NOTE — ED Triage Notes (Signed)
Pt BIB EMS from home. Pt COVID+ 9/7. Home health reports low BP. PT[facility] reports 130/80. PT[facility] reports 93% RA. Pt here per request of home health. Pt bedridden with spinal stenosis. Pt has wound VAC. Pt has sacral wound. Pt has foley catheter. Pt wife is main caregiver and is hospitalized with COVID.

## 2020-02-27 NOTE — H&P (Addendum)
History and Physical    [name] DOB: 08-19-1940 DOA: 02/27/2020  PCP: [name], MD   Patient coming from: Home  I have personally briefly reviewed patient's old medical records in [place]  Chief Complaint: Weakness  HPI: [name] is a 79 y.o. male with medical history significant forCOPD/chronic respiratory failure with hypoxia on 2 L supplemental O2 via [place] at home,chronic diastolic CHF (last EF 53-97% by TTE 09/18/2019), T2DM, HTN, HLD, hypothyroidism, PVD, thoracic ascending aortic aneurysm, CAS s/p left CEA, neurogenic bladder with chronic indwelling Foley catheter, spinal stenosis with chronic pain and nonambulatory status,sacral wound with wound VAC in place,OSA on CPAP who was recently discharged from the hospital following treatment for COVID-19 pneumonia with acute respiratory failure. Patient was treated with high-dose IV steroids and received 5 days of remdesivir.  He initially had an increased oxygen requirement and was on 4 L of oxygen during his hospitalization above his baseline of 2 L.  He was eventually weaned back down to 2 L upon discharge. Patient's COVID-19 PCR test was positive on 09/02.  He was discharged home on 02/24/20 and his wife and caregiver is currently in the hospital admitted for COVID-19 pneumonia.  Patient was brought back to the ER because he is unable to care for himself and is bed bound.  He complains of generalized weakness, shortness of breath at rest and continues to have poor oral intake. Labs show sodium 137, potassium 4.4, chloride 95, bicarb 30, BUN 29, creatinine 0.79, calcium 8.5, white count 8.7, hemoglobin 14.2, hematocrit 43.9, MCV 83.6, platelet count 309. Chest x-ray reviewed by me shows opacities in right upper lung which is improved from prior. Twelve-lead EKG shows sinus rhythm with a right bundle branch block and left anterior fascicular block Patient received 2 doses of the Pfizer vaccine earlier in  the year    ED Course: Patient is a 79 year old male with multiple medical problems who is bedbound and requires assistance with all activities of daily living who was recently discharged from the hospital but was brought back to the ER for evaluation of generalized weakness and poor oral intake.  Patient's wife/caregiver is admitted to the hospital for Covid pneumonia and acute respiratory failure and he is unable to care for himself.  He will be referred to observation status  Review of Systems: As per HPI otherwise 10 point review of systems negative.   Past Medical History:  Diagnosis Date  . Arthritis   . Barrett esophagus   . Bronchitis   . Carotid artery occlusion   . COPD (chronic obstructive pulmonary disease) ([place])   . Diabetes mellitus without complication ([place])   . GERD (gastroesophageal reflux disease)   . Headache    migraines  . History of thyroid cancer 2008  . Hypertension   . Restless leg   . Sleep apnea with use of continuous positive airway pressure (CPAP)    2011 [facility] , [facility]  77cn central and obstrcutive. 16 cm water , 3 cm EPR.   . Thoracic ascending aortic aneurysm (HCC)    4.2 cm ascending TAA 08/18/17 CTA. Annual imaging recommended.  . thyroid ca dx'd 2009 or 2010   surg and radioactive isoptope  . Ulcer    Peptic ulcer disease    Past Surgical History:  Procedure Laterality Date  . ANTERIOR CERVICAL DECOMP/DISCECTOMY FUSION N/A 09/03/2017   Procedure: ACDF - C3-C4 - C4-C5 - C5-C6;  Surgeon: Kary Kos, MD;  Location: [facility];  Service: Neurosurgery;  Laterality: N/A;  . CAROTID ENDARTERECTOMY Left January 03, 2006   Dr. Amedeo Plenty  . COLONOSCOPY    . EYE SURGERY Bilateral    cataract surgery with lens implants  . game keepers thumb Right   . JOINT REPLACEMENT Left    knee X 2  . LUMBAR LAMINECTOMY/ DECOMPRESSION WITH MET-RX N/A 11/04/2019   Procedure: LUMBAR THREE-FOUR LUMBAR LAMINECTOMY/ DECOMPRESSION WITH MET-RX;  Surgeon: Judith Part, MD;   Location: [facility];  Service: Neurosurgery;  Laterality: N/A;  . RADIOLOGY WITH ANESTHESIA N/A 10/16/2019   Procedure: MRI THORACIC LUMBAR SPINE;  Surgeon: Radiologist, Medication, MD;  Location: [facility];  Service: Radiology;  Laterality: N/A;  . ROTATOR CUFF REPAIR Right   . THYROIDECTOMY  2008     reports that he quit smoking about 14 years ago. His smoking use included cigars and cigarettes. He quit after 40.00 years of use. He has never used smokeless tobacco. He reports that he does not drink alcohol and does not use drugs.  No Known Allergies  Family History  Problem Relation Age of Onset  . COPD Mother   . Hyperlipidemia Mother   . Hypertension Mother   . Diabetes Father   . Kidney disease Father        ESRD  . Hypertension Father   . Cancer Father   . Hyperlipidemia Father      Prior to Admission medications   Medication Sig Start Date End Date Taking? Authorizing Provider  acetaminophen (TYLENOL) 325 MG tablet Take 2 tablets (650 mg total) by mouth every 6 (six) hours as needed (pain or temperature). 10/24/19   Angiulli, Lavon Paganini, PA-C  albuterol (PROVENTIL) (2.5 MG/3ML) 0.083% nebulizer solution Inhale 3 mLs (2.5 mg total) into the lungs every 4 (four) hours as needed for wheezing or shortness of breath. 10/24/19   Angiulli, Lavon Paganini, PA-C  albuterol (VENTOLIN HFA) 108 (90 Base) MCG/ACT inhaler Inhale 1 puff into the lungs every 4 (four) hours as needed for wheezing or shortness of breath. 11/20/19   Angiulli, Lavon Paganini, PA-C  ascorbic acid (VITAMIN C) 500 MG tablet Take 1 tablet (500 mg total) by mouth daily. 02/25/20 03/26/20  [place] Donnamarie Poag, DO  aspirin EC 325 MG tablet Take 325 mg by mouth daily.    [provider]  atorvastatin (LIPITOR) 20 MG tablet Take 1 tablet (20 mg total) by mouth daily at 6 PM. 11/20/19   Angiulli, Lavon Paganini, PA-C  baclofen (LIORESAL) 20 MG tablet Take 2 tablets (40 mg total) by mouth 4 (four) times daily. For spasticity 01/12/20   Lovorn, Jinny Blossom, MD   calcium citrate (CALCITRATE - DOSED IN MG ELEMENTAL CALCIUM) 950 (200 Ca) MG tablet Take 1 tablet (200 mg of elemental calcium total) by mouth daily. 11/20/19   Angiulli, Lavon Paganini, PA-C  carbidopa-levodopa (SINEMET IR) 10-100 MG tablet Take 1 tablet by mouth 2 (two) times daily. Patient not taking: Reported on 02/20/2020 11/20/19   Angiulli, Lavon Paganini, PA-C  cholecalciferol (VITAMIN D) 25 MCG (1000 UNIT) tablet Take 1 tablet (1,000 Units total) by mouth daily. 11/20/19   Angiulli, Lavon Paganini, PA-C  colchicine 0.6 MG tablet Take 1 tablet (0.6 mg total) by mouth daily. 11/20/19   Angiulli, Lavon Paganini, PA-C  docusate sodium (COLACE) 100 MG capsule Take 1 capsule (100 mg total) by mouth 2 (two) times daily. 11/10/19   Hosie Poisson, MD  furosemide (LASIX) 40 MG tablet Take 1 tablet (40 mg total) by mouth daily.  11/20/19   Angiulli, Lavon Paganini, PA-C  gabapentin (NEURONTIN) 300 MG capsule Take 300 mg by mouth at bedtime.     [provider]  guaiFENesin (MUCINEX) 600 MG 12 hr tablet Take 2 tablets (1,200 mg total) by mouth 2 (two) times daily. 11/20/19   Angiulli, Lavon Paganini, PA-C  HYDROcodone-acetaminophen (NORCO/VICODIN) 5-325 MG tablet Take 1 tablet by mouth every 6 (six) hours as needed for moderate pain or severe pain. Patient taking differently: Take 1 tablet by mouth every 6 (six) hours.  01/12/20   Lovorn, Jinny Blossom, MD  latanoprost (XALATAN) 0.005 % ophthalmic solution Place 1 drop into both eyes at bedtime. 10/24/19   Angiulli, Lavon Paganini, PA-C  levothyroxine (SYNTHROID) 125 MCG tablet Take 1 tablet (125 mcg total) by mouth daily before breakfast. 11/20/19   Angiulli, Lavon Paganini, PA-C  methocarbamol (ROBAXIN) 750 MG tablet Take 1 tablet (750 mg total) by mouth every 6 (six) hours as needed for muscle spasms. 11/20/19   Angiulli, Lavon Paganini, PA-C  metoprolol succinate (TOPROL-XL) 25 MG 24 hr tablet Take 1 tablet (25 mg total) by mouth daily. 11/21/19   Angiulli, Lavon Paganini, PA-C  Multiple Vitamin (MULTIVITAMIN) tablet Take 1 tablet  by mouth daily.    [provider]  nitroGLYCERIN (NITROSTAT) 0.4 MG SL tablet Place 0.4 mg every 5 (five) minutes as needed under the tongue for chest pain.    [provider]  pantoprazole (PROTONIX) 40 MG tablet Take 1 tablet (40 mg total) by mouth every evening. 11/20/19   Angiulli, Lavon Paganini, PA-C  polyethylene glycol powder (MIRALAX) 17 GM/SCOOP powder Take 0.5 Containers by mouth daily. 17 gm every other day     [provider]  predniSONE (DELTASONE) 10 MG tablet Take 4 tablets (40 mg total) by mouth daily for 3 days, THEN 3 tablets (30 mg total) daily for 3 days, THEN 2 tablets (20 mg total) daily for 3 days, THEN 1 tablet (10 mg total) daily for 3 days. 02/25/20 03/08/20  [place] Eric J, DO  rizatriptan (MAXALT) 10 MG tablet Take 10 mg by mouth daily as needed for migraine.  12/08/19   [provider]  senna-docusate (SENOKOT-S) 8.6-50 MG tablet Take 2 tablets by mouth 2 (two) times daily. Patient taking differently: Take 2 tablets by mouth at bedtime.  11/10/19   Hosie Poisson, MD  spironolactone (ALDACTONE) 25 MG tablet Take 1 tablet (25 mg total) by mouth daily. Patient taking differently: Take 25 mg by mouth every evening.  11/21/19   Angiulli, Lavon Paganini, PA-C  TRELEGY ELLIPTA 100-62.5-25 MCG/INH AEPB Inhale 1 puff into the lungs daily.  05/01/18   [provider]  zinc sulfate 220 (50 Zn) MG capsule Take 1 capsule (220 mg total) by mouth daily. 02/25/20 03/26/20  [place] Eric J, DO    Physical Exam: Vitals:   02/27/20 1409 02/27/20 1430 02/27/20 1522 02/27/20 1530  BP: 119/61 114/68 123/70 118/77  Pulse: 65 66 63 66  Resp: _0 Temp: 98.3 F (36.8 C)     TempSrc: Oral     SpO2: 95% 95% 96% 93%  Weight: 90.7 kg     Height: _1  (1.702 m)        Vitals:   02/27/20 1409 02/27/20 1430 02/27/20 1522 02/27/20 1530  BP: 119/61 114/68 123/70 118/77  Pulse: 65 66 63 66  Resp: _2 Temp: 98.3 F (36.8 C)     TempSrc: Oral  SpO2: 95% 95% 96% 93%  Weight: 90.7 kg     Height: _0  (1.702 m)       Constitutional: NAD, alert and oriented x 3.  Chronically ill-appearing Eyes: PERRL, lids and conjunctivae pallor ENMT: Mucous membranes are moist.  Neck: normal, supple, no masses, no thyromegaly Respiratory: clear to auscultation bilaterally, no wheezing, no crackles. Normal respiratory effort. No accessory muscle use.  Cardiovascular: Regular rate and rhythm, no murmurs / rubs / gallops. No extremity edema. 2+ pedal pulses. No carotid bruits.  Abdomen: no tenderness, no masses palpated. No hepatosplenomegaly. Bowel sounds positive.  Musculoskeletal: no clubbing / cyanosis. No joint deformity upper and lower extremities.  Skin: no rashes, lesions, sacral decubitus ulcer with wound VAC in place Neurologic: Generalized weakness Psychiatric: Flat affect.   Labs on Admission: I have personally reviewed following labs and imaging studies  CBC: Recent Labs  Lab 02/21/20 0514 02/22/20 0454 02/23/20 0507 02/24/20 0453 02/27/20 1320  WBC 5.3 6.8 7.0 5.7 8.7  NEUTROABS 3.8 5.1 5.5 4.5 6.6  HGB 13.4 13.3 12.3* 13.2 14.2  HCT 42.3 42.2 38.6* 41.4 43.9  MCV 84.8 85.9 85.8 84.3 83.6  PLT 227 214 186 188 168   Basic Metabolic Panel: Recent Labs  Lab 02/21/20 0514 02/22/20 0454 02/23/20 0507 02/24/20 0453 02/27/20 1320  NA 142 141 141 140 137  K 4.2 4.5 4.2 4.2 4.4  CL 104 102 101 102 95*  CO2 _1 GLUCOSE 116* 106* 100* 114* 87  BUN 27* 29* 28* 25* 29*  CREATININE 0.71 0.81 0.71 0.63 0.79  CALCIUM 8.8* 8.8* 8.7* 8.7* 8.5*  MG 2.6* 2.5* 2.3 2.3  --   PHOS 4.0 3.8 3.1 3.3  --    GFR: Estimated Creatinine Clearance: 81.7 mL/min (by C-G formula based on SCr of 0.79 mg/dL). Liver Function Tests: Recent Labs  Lab 02/21/20 0514 02/22/20 0454 02/23/20 0507 02/24/20 0453  AST _2 ALT _3 ALKPHOS 59 57 59 60  BILITOT 0.4 0.6 0.7 0.6  PROT 6.9 7.0 6.4* 7.0  ALBUMIN 3.5  3.6 3.4* 3.5   No results for input(s): LIPASE, AMYLASE in the last 168 hours. No results for input(s): AMMONIA in the last 168 hours. Coagulation Profile: No results for input(s): INR, PROTIME in the last 168 hours. Cardiac Enzymes: No results for input(s): CKTOTAL, CKMB, CKMBINDEX, TROPONINI in the last 168 hours. BNP (last 3 results) No results for input(s): PROBNP in the last 8760 hours. HbA1C: No results for input(s): HGBA1C in the last 72 hours. CBG: Recent Labs  Lab 02/23/20 1130 02/23/20 1618 02/23/20 2055 02/24/20 0736 02/24/20 1114  GLUCAP 100* 109* 111* 92 106*   Lipid Profile: No results for input(s): CHOL, HDL, LDLCALC, TRIG, CHOLHDL, LDLDIRECT in the last 72 hours. Thyroid Function Tests: No results for input(s): TSH, T4TOTAL, FREET4, T3FREE, THYROIDAB in the last 72 hours. Anemia Panel: No results for input(s): VITAMINB12, FOLATE, FERRITIN, TIBC, IRON, RETICCTPCT in the last 72 hours. Urine analysis:    Component Value Date/Time   COLORURINE YELLOW 02/19/2020 1943   APPEARANCEUR CLOUDY (A) 02/19/2020 1943   LABSPEC 1.026 02/19/2020 1943   PHURINE 5.0 02/19/2020 1943   GLUCOSEU NEGATIVE 02/19/2020 1943   HGBUR NEGATIVE 02/19/2020 1943   BILIRUBINUR SMALL (A) 02/19/2020 1943   KETONESUR NEGATIVE 02/19/2020 1943   PROTEINUR 30 (A) 02/19/2020 1943   UROBILINOGEN 1.0 09/09/2010 1045   NITRITE POSITIVE (A) 02/19/2020 1943   LEUKOCYTESUR  LARGE (A) 02/19/2020 1943    Radiological Exams on Admission: DG Chest Portable 1 View  Result Date: 02/27/2020 CLINICAL DATA:  COVID, shortness of breath. EXAM: PORTABLE CHEST 1 VIEW COMPARISON:  02/19/2020 chest radiograph and prior. FINDINGS: Prior right upper lung infiltrate is less conspicuous than prior exam. No new focal opacity. No pneumothorax or pleural effusion. Cardiomediastinal silhouette is unchanged. No acute osseous abnormality. IMPRESSION: Decreased conspicuity of right upper lung opacities. No new focal  opacities. Electronically Signed   By: Primitivo Gauze M.D.   On: 02/27/2020 14:02    EKG: Independently reviewed.  Sinus rhythm Right bundle branch block with left anterior fascicular block  Assessment/Plan Principal Problem:   Generalized weakness Active Problems:   Diabetes mellitus type 2, controlled, with complications (HCC)   Chronic respiratory failure with hypoxia (HCC)   Chronic diastolic congestive heart failure (HCC)   Hypothyroidism   Chronic indwelling Foley catheter   COPD (chronic obstructive pulmonary disease) ([place])   Pneumonia due to COVID-19 virus   Functional quadriplegia (HCC)      Generalized weakness/shortness of breath Residual COVID-19 viral infection symptoms Continue as needed bronchodilator therapy We will request PT evaluation   History of COVID-19 viral pneumonia Improved CXR with right upper lobe infiltrate which shows some improvement COVID test: + PCR 9/2 Continue airborne/contact isolation precautions Continue supportive care with albuterol MDI prn, vitamin C, zinc, Tylenol, antitussives (benzonatate/ Mucinex/Tussionex) Incentive spirometry/flutter valve   COPD on 2L [place] baseline Without acute exacerbation.  Continue Trelegy Ellipta inhaler with hospital substitution Continue as needed bronchodilator therapy Continue daily prednisone   Chronic diastolic CHF: Stable and not in acute exacerbation Continue furosemide, metoprolol and spironolactone    Essential Hypertension: Blood pressure stable Continue metoprolol   Diet controlled type 2 diabetes: Maintain consistent carbohydrate diet Place patient on sliding scale coverage with insulin     Neurogenic bladder with chronic indwelling Foley catheter: Continue Foley care.   Sacral pressure wound, present on admission: Lower extremity pressure wounds, present on admission Wound VAC in place. Follows with wound center. Wound care  consultation   Hypothyroidism: Continue Synthroid 125 mcg p.o. daily.   Dyslipidemia Continue atorvastatin 20 mg p.o. daily.   Chronic pain syndrome/spinal stenosis/spasticity/RLS: Continue home Norco as needed with hold parameters, Robaxin, gabapentin, baclofen, Sinemet.   OSA:  Continue CPAP nightly.   Functional quadriplegia Patient is bedbound from severe spinal stenosis and requires assistance with all activities of daily living He will need frequent turning to prevent development of further pressure ulcers     DVT prophylaxis: Lovenox Code Status: DO NOT RESUSCITATE Family Communication: Greater than 50% of time was spent discussing plan of care with patient at the bedside.  All questions and concerns have been addressed.  He verbalizes understanding and agrees with the plan. Disposition Plan: Back to previous home environment Consults called: Wound care consult    Solei Wubben MD [facility]     02/27/2020, 4:18 PM

## 2020-02-27 NOTE — ED Provider Notes (Signed)
[facility] DEPT Provider Note   CSN: [id number] Arrival date & time: 02/27/20  1146     History Chief Complaint  Patient presents with  . Covid Exposure    [name] is a 79 y.o. male with a past medical history of COPD on 2 L of oxygen via nasal cannula at baseline, diabetes, GERD, paraplegia, Foley in place, spinal stenosis, wound VAC on sacral wound, recent admission for hypoxia in the setting of Covid infection from 02/22/2020 until 02/24/2020 presenting to the ED with a chief complaint of generalized weakness.  Patient states that "I did not even want to come here but my son made me."  His son was concerned that he was dehydrated from lack of appetite.  Patient states that he feels at his baseline.  He still wears 2 L of oxygen by nasal cannula.  He was increased to 4 L and then wean down during his hospitalization.  He denies any vomiting.  Does report ongoing cough.  Denies any chest pain, abdominal pain, fever, injuries or falls. He is bedridden at baseline.  HPI     Past Medical History:  Diagnosis Date  . Arthritis   . Barrett esophagus   . Bronchitis   . Carotid artery occlusion   . COPD (chronic obstructive pulmonary disease) ([place])   . Diabetes mellitus without complication ([place])   . GERD (gastroesophageal reflux disease)   . Headache    migraines  . History of thyroid cancer 2008  . Hypertension   . Restless leg   . Sleep apnea with use of continuous positive airway pressure (CPAP)    2011 [facility] , AHI  77cn central and obstrcutive. 16 cm water , 3 cm EPR.   . Thoracic ascending aortic aneurysm (HCC)    4.2 cm ascending TAA 08/18/17 CTA. Annual imaging recommended.  . thyroid ca dx'd 2009 or 2010   surg and radioactive isoptope  . Ulcer    Peptic ulcer disease    Patient Active Problem List   Diagnosis Date Noted  . Generalized weakness 02/27/2020  . Pneumonia due to COVID-19 virus 02/20/2020  . Acute hypoxemic  respiratory failure due to COVID-19 ([place]) 02/19/2020  . Hyperlipidemia associated with type 2 diabetes mellitus ([place]) 02/19/2020  . Chronic indwelling Foley catheter 02/19/2020  . COPD (chronic obstructive pulmonary disease) ([place]) 02/19/2020  . Educated about COVID-19 virus infection 01/22/2020  . Stage 4 skin ulcer of sacral region ([place]) 01/12/2020  . Hypothyroidism   . Palliative care by specialist   . DNR (do not resuscitate)   . Acute on chronic respiratory failure with hypoxia ([place]) 10/29/2019  . Paraplegia ([place]) 10/28/2019  . Labile blood pressure   . Spasticity   . Muscle spasm   . Hypoxia   . Supplemental oxygen dependent   . Neurogenic bladder   . Chronic diastolic congestive heart failure ([place])   . Neurogenic bowel   . Hypocalcemia   . Spinal stenosis of lumbar region   . Cervical myelopathy ([place]) 09/26/2019  . Diabetes mellitus type 2, controlled, with complications ([place]) 76/73/4193  . Obese 09/17/2019  . Thoracic aortic aneurysm ([place]) 09/17/2019  . Bowel incontinence 09/17/2019  . Bladder incontinence 09/17/2019  . Right hemiparesis ([place]) 09/17/2019  . Acute renal failure (ARF) ([place]) 09/17/2019  . Left hemiparesis ([place]) 09/17/2019  . Chronic respiratory failure with hypoxia ([place]) 09/17/2019  . Syncope and collapse 09/17/2019  . Chronic diastolic CHF (congestive heart failure) ([place]) 09/04/2019  .  PVD (peripheral vascular disease) ([place]) 02/14/2019  . Hypertension associated with diabetes ([place]) 02/13/2019  . Non-insulin treated type 2 diabetes mellitus ([place]) 02/13/2019  . Community acquired pneumonia of right lower lobe of lung 01/17/2019  . Postural urinary incontinence 05/20/2018  . Myelopathy concurrent with and due to spinal stenosis of cervical region ([place]) 05/20/2018  . Myoclonic jerking 05/20/2018  . 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 History of respiratory failure 08/18/2017  . Hypokalemia 08/18/2017  . Leukocytosis  08/18/2017  . Muscle atrophy of lower extremity 08/09/2017  . Bowel and bladder incontinence 08/09/2017  . Right-sided muscle weakness 08/09/2017  . Neuropathy 08/09/2017  . 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/2017  . Abnormality of gait 07/12/2017  . Myoclonic jerkings, 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/2017  . Acquired right foot drop 07/12/2017  . UTI (urinary tract infection) due to Enterococcus 07/12/2017  . Pressure ulcer 06/21/2017  . Acute cystitis 06/20/2017  . Weakness generalized 06/20/2017  . Dehydration 06/20/2017  . Dyspnea 12/26/2016  . Chronic cough 12/26/2016  . Hypersomnia with sleep apnea 10/30/2016  . CSA (central sleep apnea) 10/30/2016  . Wheezing symptom 10/30/2016  . 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 RLS (restless legs syndrome) 08/25/2014  . Primary gout 08/25/2014  . OSA on CPAP 08/25/2014  . Severe obesity (BMI >= 40) ([place]) 08/25/2014  . Obesity (BMI 30-39.9) 02/18/2013  . 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 S/P TKR (total knee replacement) 11/29/2012  . History of thyroid cancer     Past Surgical History:  Procedure Laterality Date  . ANTERIOR CERVICAL DECOMP/DISCECTOMY FUSION N/A 09/03/2017   Procedure: ACDF - C3-C4 - C4-C5 - C5-C6;  Surgeon: Kary Kos, MD;  Location: [facility];  Service: Neurosurgery;  Laterality: N/A;  . CAROTID ENDARTERECTOMY Left January 03, 2006   Dr. Amedeo Plenty  . COLONOSCOPY    . EYE SURGERY Bilateral    cataract surgery with lens implants  . game keepers thumb Right   . JOINT REPLACEMENT Left    knee X 2  . LUMBAR LAMINECTOMY/ DECOMPRESSION WITH MET-RX N/A 11/04/2019   Procedure: LUMBAR THREE-FOUR LUMBAR LAMINECTOMY/ DECOMPRESSION WITH MET-RX;  Surgeon: Judith Part, MD;  Location: [facility];  Service: Neurosurgery;  Laterality: N/A;  . RADIOLOGY WITH ANESTHESIA N/A 10/16/2019   Procedure: MRI THORACIC LUMBAR SPINE;  Surgeon: Radiologist, Medication, MD;   Location: [facility];  Service: Radiology;  Laterality: N/A;  . ROTATOR CUFF REPAIR Right   . THYROIDECTOMY  2008       Family History  Problem Relation Age of Onset  . COPD Mother   . Hyperlipidemia Mother   . Hypertension Mother   . Diabetes Father   . Kidney disease Father        ESRD  . Hypertension Father   . Cancer Father   . Hyperlipidemia Father     Social History   Tobacco Use  . Smoking status: Former Smoker    Years: 40.00    Types: Cigars, Cigarettes    Quit date: 06/19/2005    Years since quitting: 14.7  . Smokeless tobacco: Never Used  . Tobacco comment: smoked 4-6 cigars daily, only smoked cigarettes socially  Vaping Use  . Vaping Use: Never used  Substance Use Topics  . Alcohol use: No  . Drug use: No    Home Medications Prior to Admission medications   Medication Sig Start Date End Date Taking? Authorizing Provider  acetaminophen (TYLENOL) 325 MG tablet Take 2 tablets (650 mg total) by mouth every 6 (six) hours as needed (pain or temperature). 10/24/19   Angiulli, Lavon Paganini, PA-C  albuterol (PROVENTIL) (2.5 MG/3ML) 0.083% nebulizer solution Inhale 3 mLs (2.5 mg total) into the lungs every 4 (four) hours as needed for wheezing or shortness of breath. 10/24/19   Angiulli, Lavon Paganini, PA-C  albuterol (VENTOLIN HFA) 108 (90 Base) MCG/ACT inhaler Inhale 1 puff into the lungs every 4 (four) hours as needed for wheezing or shortness of breath. 11/20/19   Angiulli, Lavon Paganini, PA-C  ascorbic acid (VITAMIN C) 500 MG tablet Take 1 tablet (500 mg total) by mouth daily. 02/25/20 03/26/20  [place] Donnamarie Poag, DO  aspirin EC 325 MG tablet Take 325 mg by mouth daily.    [provider]  atorvastatin (LIPITOR) 20 MG tablet Take 1 tablet (20 mg total) by mouth daily at 6 PM. 11/20/19   Angiulli, Lavon Paganini, PA-C  baclofen (LIORESAL) 20 MG tablet Take 2 tablets (40 mg total) by mouth 4 (four) times daily. For spasticity 01/12/20   Lovorn, Jinny Blossom, MD  calcium citrate (CALCITRATE - DOSED IN MG  ELEMENTAL CALCIUM) 950 (200 Ca) MG tablet Take 1 tablet (200 mg of elemental calcium total) by mouth daily. 11/20/19   Angiulli, Lavon Paganini, PA-C  carbidopa-levodopa (SINEMET IR) 10-100 MG tablet Take 1 tablet by mouth 2 (two) times daily. Patient not taking: Reported on 02/20/2020 11/20/19   Angiulli, Lavon Paganini, PA-C  cholecalciferol (VITAMIN D) 25 MCG (1000 UNIT) tablet Take 1 tablet (1,000 Units total) by mouth daily. 11/20/19   Angiulli, Lavon Paganini, PA-C  colchicine 0.6 MG tablet Take 1 tablet (0.6 mg total) by mouth daily. 11/20/19   Angiulli, Lavon Paganini, PA-C  docusate sodium (COLACE) 100 MG capsule Take 1 capsule (100 mg total) by mouth 2 (two) times daily. 11/10/19   Hosie Poisson, MD  furosemide (LASIX) 40 MG tablet Take 1 tablet (40 mg total) by mouth daily. 11/20/19   Angiulli, Lavon Paganini, PA-C  gabapentin (NEURONTIN) 300 MG capsule Take 300 mg by mouth at bedtime.     [provider]  guaiFENesin (MUCINEX) 600 MG 12 hr tablet Take 2 tablets (1,200 mg total) by mouth 2 (two) times daily. 11/20/19   Angiulli, Lavon Paganini, PA-C  HYDROcodone-acetaminophen (NORCO/VICODIN) 5-325 MG tablet Take 1 tablet by mouth every 6 (six) hours as needed for moderate pain or severe pain. Patient taking differently: Take 1 tablet by mouth every 6 (six) hours.  01/12/20   Lovorn, Jinny Blossom, MD  latanoprost (XALATAN) 0.005 % ophthalmic solution Place 1 drop into both eyes at bedtime. 10/24/19   Angiulli, Lavon Paganini, PA-C  levothyroxine (SYNTHROID) 125 MCG tablet Take 1 tablet (125 mcg total) by mouth daily before breakfast. 11/20/19   Angiulli, Lavon Paganini, PA-C  methocarbamol (ROBAXIN) 750 MG tablet Take 1 tablet (750 mg total) by mouth every 6 (six) hours as needed for muscle spasms. 11/20/19   Angiulli, Lavon Paganini, PA-C  metoprolol succinate (TOPROL-XL) 25 MG 24 hr tablet Take 1 tablet (25 mg total) by mouth daily. 11/21/19   Angiulli, Lavon Paganini, PA-C  Multiple Vitamin (MULTIVITAMIN) tablet Take 1 tablet by mouth daily.    [provider]   nitroGLYCERIN (NITROSTAT) 0.4 MG SL tablet Place 0.4 mg every 5 (five) minutes as needed under the tongue for chest pain.    [provider]  pantoprazole (PROTONIX) 40 MG tablet Take 1 tablet (40 mg total) by mouth every  evening. 11/20/19   Angiulli, Lavon Paganini, PA-C  polyethylene glycol powder (MIRALAX) 17 GM/SCOOP powder Take 0.5 Containers by mouth daily. 17 gm every other day     [provider]  predniSONE (DELTASONE) 10 MG tablet Take 4 tablets (40 mg total) by mouth daily for 3 days, THEN 3 tablets (30 mg total) daily for 3 days, THEN 2 tablets (20 mg total) daily for 3 days, THEN 1 tablet (10 mg total) daily for 3 days. 02/25/20 03/08/20  [place] Eric J, DO  rizatriptan (MAXALT) 10 MG tablet Take 10 mg by mouth daily as needed for migraine.  12/08/19   [provider]  senna-docusate (SENOKOT-S) 8.6-50 MG tablet Take 2 tablets by mouth 2 (two) times daily. Patient taking differently: Take 2 tablets by mouth at bedtime.  11/10/19   Hosie Poisson, MD  spironolactone (ALDACTONE) 25 MG tablet Take 1 tablet (25 mg total) by mouth daily. Patient taking differently: Take 25 mg by mouth every evening.  11/21/19   Angiulli, Lavon Paganini, PA-C  TRELEGY ELLIPTA 100-62.5-25 MCG/INH AEPB Inhale 1 puff into the lungs daily.  05/01/18   [provider]  zinc sulfate 220 (50 Zn) MG capsule Take 1 capsule (220 mg total) by mouth daily. 02/25/20 03/26/20  [place] Eric J, DO    Allergies    Patient has no known allergies.  Review of Systems   Review of Systems  Constitutional: Positive for activity change and appetite change. Negative for chills and fever.  HENT: Negative for ear pain, rhinorrhea, sneezing and sore throat.   Eyes: Negative for photophobia and visual disturbance.  Respiratory: Negative for cough, chest tightness, shortness of breath and wheezing.   Cardiovascular: Negative for chest pain and palpitations.  Gastrointestinal: Negative for abdominal pain, blood in  stool, constipation, diarrhea, nausea and vomiting.  Genitourinary: Negative for dysuria, hematuria and urgency.  Musculoskeletal: Negative for myalgias.  Skin: Negative for rash.  Neurological: Negative for dizziness, weakness and light-headedness.    Physical Exam Updated Vital Signs BP 114/68   Pulse 66   Temp 98.3 F (36.8 C) (Oral)   Resp 18   Ht '5\' 7"'  (1.702 m)   Wt 90.7 kg   SpO2 95%   BMI 31.32 kg/m   Physical Exam Vitals and nursing note reviewed.  Constitutional:      General: He is not in acute distress.    Appearance: He is well-developed.  HENT:     Head: Normocephalic and atraumatic.     Nose: Nose normal.  Eyes:     General: No scleral icterus.       Right eye: No discharge.        Left eye: No discharge.     Conjunctiva/sclera: Conjunctivae normal.  Cardiovascular:     Rate and Rhythm: Normal rate and regular rhythm.     Heart sounds: Normal heart sounds. No murmur heard.  No friction rub. No gallop.   Pulmonary:     Effort: Pulmonary effort is normal. No respiratory distress.     Breath sounds: Normal breath sounds.  Abdominal:     General: Bowel sounds are normal. There is no distension.     Palpations: Abdomen is soft.     Tenderness: There is no abdominal tenderness. There is no guarding.     Comments: Foley and wound VAC in place.  Musculoskeletal:        General: Normal range of motion.     Cervical back: Normal range of motion and neck  supple.     Right lower leg: No edema.     Left lower leg: No edema.  Skin:    General: Skin is warm and dry.     Findings: No rash.  Neurological:     Mental Status: He is alert.     Motor: No abnormal muscle tone.     Coordination: Coordination normal.     ED Results / Procedures / Treatments   Labs (all labs ordered are listed, but only abnormal results are displayed) Labs Reviewed  BASIC METABOLIC PANEL - Abnormal; Notable for the following components:      Result Value   Chloride 95 (*)     BUN 29 (*)    Calcium 8.5 (*)    All other components within normal limits  CBC WITH DIFFERENTIAL/PLATELET - Abnormal; Notable for the following components:   RDW 15.9 (*)    All other components within normal limits    EKG EKG Interpretation  Date/Time:  Friday February 27 2020 13:11:01 EDT Ventricular Rate:  66 PR Interval:    QRS Duration: 128 QT Interval:  460 QTC Calculation: 482 R Axis:   -78 Text Interpretation: Sinus rhythm RBBB and LAFB Confirmed by Dene Gentry [phone]) on 02/27/2020 1:44:40 PM   Radiology DG Chest Portable 1 View  Result Date: 02/27/2020 CLINICAL DATA:  COVID, shortness of breath. EXAM: PORTABLE CHEST 1 VIEW COMPARISON:  02/19/2020 chest radiograph and prior. FINDINGS: Prior right upper lung infiltrate is less conspicuous than prior exam. No new focal opacity. No pneumothorax or pleural effusion. Cardiomediastinal silhouette is unchanged. No acute osseous abnormality. IMPRESSION: Decreased conspicuity of right upper lung opacities. No new focal opacities. Electronically Signed   By: Primitivo Gauze M.D.   On: 02/27/2020 14:02    Procedures Procedures (including critical care time)  Medications Ordered in ED Medications - No data to display  ED Course  I have reviewed the triage vital signs and the nursing notes.  Pertinent labs & imaging results that were available during my care of the patient were reviewed by me and considered in my medical decision making (see chart for details).    MDM Rules/Calculators/A&P                          79 year old male with a past medical history of paraplegia, sacral wounds with wound VAC in place, COPD on 2 L of oxygen via nasal cannula at baseline, recent Covid infection requiring hospitalization last week with discharge 3 days ago presenting to the ED for generalized weakness.  He is concerned that he is having decreased appetite and fatigue from his Covid infection.  His wife is his primary caretaker and  is actually here in the ED seeking treatment for hypoxia in the setting of Covid infection.  He does not have anyone at home to take care of him.  He denies any chest pain, abdominal pain, vomiting.  Here EKG shows sinus rhythm, no ischemic changes.  Chest x-ray shows some improvement in the Covid findings.  Lab work including CBC and BMP are unremarkable.  Patient will require admission due to his ongoing health needs and deconditioning in the setting of COVID infection. Consider placement in SNF. Appreciate the help of hospitalist for management of this patient.  Portions of this note were generated with [employer]obby[employer]. Dictation errors may occur despite best attempts at proofreading.  Final Clinical Impression(s) / ED Diagnoses Final diagnoses:  COVID-19 virus infection  Rx / DC Orders ED Discharge Orders    None       Delia Heady, PA-C 02/27/20 1500    Valarie Merino, MD 03/05/20 1447

## 2020-02-28 DIAGNOSIS — R531 Weakness: Secondary | ICD-10-CM | POA: Diagnosis not present

## 2020-02-28 LAB — BASIC METABOLIC PANEL
Anion gap: 15 (ref 5–15)
BUN: 29 mg/dL — ABNORMAL HIGH (ref 8–23)
CO2: 30 mmol/L (ref 22–32)
Calcium: 8.7 mg/dL — ABNORMAL LOW (ref 8.9–10.3)
Chloride: 95 mmol/L — ABNORMAL LOW (ref 98–111)
Creatinine, Ser: 0.73 mg/dL (ref 0.61–1.24)
GFR calc Af Amer: >60 mL/min (ref >60)
GFR calc non Af Amer: >60 mL/min (ref >60)
Glucose, Bld: 73 mg/dL (ref 70–99)
Potassium: 3.4 mmol/L — ABNORMAL LOW (ref 3.5–5.1)
Sodium: 140 mmol/L (ref 135–145)

## 2020-02-28 LAB — CBC
HCT: 43.1 % (ref 39.0–52.0)
Hemoglobin: 13.8 g/dL (ref 13.0–17.0)
MCH: 27.1 pg (ref 26.0–34.0)
MCHC: 32 g/dL (ref 30.0–36.0)
MCV: 84.7 fL (ref 80.0–100.0)
Platelets: 298 10*3/uL (ref 150–400)
RBC: 5.09 MIL/uL (ref 4.22–5.81)
RDW: 15.6 % — ABNORMAL HIGH (ref 11.5–15.5)
WBC: 8.8 10*3/uL (ref 4.0–10.5)
nRBC: 0 % (ref 0.0–0.2)

## 2020-02-28 LAB — GLUCOSE, CAPILLARY
Glucose-Capillary: 71 mg/dL (ref 70–99)
Glucose-Capillary: 81 mg/dL (ref 70–99)
Glucose-Capillary: 83 mg/dL (ref 70–99)
Glucose-Capillary: 140 mg/dL — ABNORMAL HIGH (ref 70–99)

## 2020-02-28 MED ORDER — SALINE SPRAY 0.65 % NA SOLN
1.0000 | NASAL | Status: DC | PRN
  Administered 2020-02-28: 1 via NASAL
  Filled 2020-02-28: qty 44

## 2020-02-28 MED ORDER — POTASSIUM CHLORIDE CRYS ER 20 MEQ PO TBCR
40.0000 meq | EXTENDED_RELEASE_TABLET | Freq: Every day | ORAL | Status: DC
  Administered 2020-02-29 – 2020-03-05 (×6): 40 meq via ORAL
  Filled 2020-02-28 (×6): qty 2

## 2020-02-28 MED ORDER — GUAIFENESIN-DM 100-10 MG/5ML PO SYRP
5.0000 mL | ORAL_SOLUTION | ORAL | Status: DC | PRN
  Administered 2020-02-28 – 2020-02-29 (×2): 5 mL via ORAL
  Filled 2020-02-28 (×3): qty 10

## 2020-02-28 MED ORDER — ORAL CARE MOUTH RINSE
15.0000 mL | Freq: Two times a day (BID) | OROMUCOSAL | Status: DC
  Administered 2020-02-28 – 2020-03-05 (×10): 15 mL via OROMUCOSAL

## 2020-02-28 MED ORDER — CHLORHEXIDINE GLUCONATE CLOTH 2 % EX PADS
6.0000 | MEDICATED_PAD | Freq: Every day | CUTANEOUS | Status: DC
  Administered 2020-02-28 – 2020-03-05 (×7): 6 via TOPICAL

## 2020-02-28 NOTE — Progress Notes (Signed)
Pt placed himself on his CPAP machine for the night.

## 2020-02-28 NOTE — Progress Notes (Signed)
Prevalon boots applied bilaterally.

## 2020-02-28 NOTE — Consult Note (Signed)
WOC Nurse Consult Note: Performed remotely, patient with Covid-19. Reason for Consult:Stage 4 sacral wound.  Patient was recently in hospital and see by my partner M. Austin on 02/23/20 for this wound. He is additionally followed by the [facility]patient [facility].  Patient is readmitted as he is bed bound and his wife and caregiver is currently an inpatient being treated for Covid-19. Bilateral heels have recently healed wounds. Wound type:Pressure Pressure Injury POA: Yes Measurement: To be obtained by bedside RN today Wound bed: 75% red, 25% yellow Drainage (amount, consistency, odor) Moderate amount serous Periwound: intact Dressing procedure/placement/frequency: After discussing with MD, Dr. Thereasa Solo and patient's bedside RN, Chevis Pretty, who is a Wound Treatment Associate (WTA), via Secure Chat feature of the EMR, we have together determined that the optimal POC will be to continue the mattress replacement with low air loss feature, place bilateral pressure redistribution heel boots, discontinue the Medela NPWT device and implement daily wound care using a silver hydrofiber dressing, dry gauze, an ABD and tape. Side to side repositioning is in place and will continue.   [facility] nursing team will not follow, but will remain available to this patient, the nursing and medical teams.  Please re-consult if needed. Thanks, Maudie Flakes, MSN, RN, [facility], Arther Abbott  Pager# [phone]

## 2020-02-28 NOTE — Progress Notes (Signed)
Pts. home CPAP s/u, humidifier filled with s/w, currently on 3 lpm n/c, setting confirmed with pt., oxygen line placed on pts. inline adapter on home CPAP hose, pt. along with RN made aware to notify if help needed to change n/c oxygen line to CPAP oxygen line when pt. Ready to be placed on for h/s.

## 2020-02-28 NOTE — Progress Notes (Signed)
Pt does not have an IV. When approached to place, pt refused to get an IV site.

## 2020-02-28 NOTE — Progress Notes (Signed)
Pt placed on air mattress bed.

## 2020-02-28 NOTE — Care Management CC44 (Signed)
Condition Code 44 Documentation Completed  Patient Details  Name: [name] MRN: [id number] Date of Birth: 06/21/1940   Condition Code 44 given:  Yes Patient signature on Condition Code 44 notice:  Yes Documentation of 2 MD's agreement:  Yes Code 44 added to claim:  Yes    Joaquin Courts, RN 02/28/2020, 4:44 PM

## 2020-02-28 NOTE — Care Management Obs Status (Signed)
MEDICARE OBSERVATION STATUS NOTIFICATION   Patient Details  Name: ICHOLAS IRBY MRN: [id number] Date of Birth: 1941-05-28   Medicare Observation Status Notification Given:  Yes    Joaquin Courts, RN 02/28/2020, 4:44 PM

## 2020-02-28 NOTE — Progress Notes (Signed)
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  He completed 5 days of Remdesivir and was dosed with IV steroids.  At the time of his discharge he had been weaned back to his baseline 2 L of oxygen.  He was returned to the ER essentially because his wife was admitted with Covid and the patient is unable to care for himself at home.  Significant Events:  9/2 + Covid test 9/7 discharge home after inpatient stay for Covid 9/10 readmit to [facility] due to inability to care for himself at home  Date of Positive COVID Test:  02/19/20  Vaccination Status: [place] -last dose March 2021  COVID-19 specific Treatment: None presently  Antimicrobials:  None  DVT prophylaxis: Lovenox  Subjective: Resting comfortably in bed.  Has no new complaints denies shortness of breath or chest pain.  Reports fair appetite and intake.  Assessment & Plan:  Generalized weakness -inability to care for himself at home Continue supportive care in hospital  COVID Pneumonia Remains stable clinically from this standpoint - remains within 21-day isolation window  Chronic hypoxic respiratory failure due to COPD Continue usual home 2 L nasal cannula support -continue usual home inhaler therapies  Chronic diastolic CHF Well compensated at present  Essential HTN Blood pressure stable presently  DM2 -diet controlled  Neurogenic bladder with chronic indwelling Foley  Sacral pressure  wound - present on admission VAC to be removed until wound care team can evaluate -utilize normal saline dressing in interim  Lower extremity pressure wounds -present on admission As above  Hypothyroidism Continue home Synthroid dose  HLD Continue home atorvastatin  Spinal stenosis with chronic pain and spasticity Continue usual home medical therapy  OSA  Functional quadriplegia   Code Status: NO CODE BLUE -DNR Family Communication:  Status is: Observation  The patient remains OBS appropriate and will d/c before 2 midnights.  Dispo: The patient is from: Home              Anticipated d/c is to: unclear presently              Anticipated d/c date is: 3 days              Patient currently is medically stable to d/c.  Consultants:  none  Objective: Blood pressure 120/65, pulse 73, temperature 98.4 F (36.9 C), temperature source Oral, resp. rate 19, height 5\' 7"  (1.702 m), weight 90.7 kg, SpO2 93 %.  Intake/Output Summary (Last 24 hours) at 02/28/2020 1003 Last data filed at 02/28/2020 0856 Gross per 24 hour  Intake 75 ml  Output 400 ml  Net -325 ml   Filed Weights   02/27/20 1226 02/27/20 1409  Weight: 90.7 kg 90.7 kg    Examination: General: No acute respiratory distress Lungs: Clear to auscultation bilaterally without wheezes or crackles Cardiovascular: Regular rate and rhythm without murmur gallop or rub normal S1 and S2 Abdomen: Nontender, nondistended, soft, bowel sounds positive, no rebound, no ascites, no appreciable mass Extremities: No significant cyanosis, clubbing, or  edema bilateral lower extremities  CBC: Recent Labs  Lab 02/23/20 0507 02/23/20 0507 02/24/20 0453 02/27/20 1320 02/28/20 0421  WBC 7.0   < > 5.7 8.7 8.8  NEUTROABS 5.5  --  4.5 6.6  --   HGB 12.3*   < > 13.2 14.2 13.8  HCT 38.6*   < > 41.4 43.9 43.1  MCV 85.8   < > 84.3 83.6 84.7  PLT 186   < > 188 309 298   < > = values in this interval not displayed.   Basic Metabolic  Panel: Recent Labs  Lab 02/22/20 0454 02/22/20 0454 02/23/20 0507 02/23/20 0507 02/24/20 0453 02/27/20 1320 02/28/20 0421  NA 141   < > 141   < > 140 137 140  K 4.5   < > 4.2   < > 4.2 4.4 3.4*  CL 102   < > 101   < > 102 95* 95*  CO2 28   < > 28   < > 29 30 30   GLUCOSE 106*   < > 100*   < > 114* 87 73  BUN 29*   < > 28*   < > 25* 29* 29*  CREATININE 0.81   < > 0.71   < > 0.63 0.79 0.73  CALCIUM 8.8*   < > 8.7*   < > 8.7* 8.5* 8.7*  MG 2.5*  --  2.3  --  2.3  --   --   PHOS 3.8  --  3.1  --  3.3  --   --    < > = values in this interval not displayed.   GFR: Estimated Creatinine Clearance: 81.7 mL/min (by C-G formula based on SCr of 0.73 mg/dL).  Liver Function Tests: Recent Labs  Lab 02/22/20 0454 02/23/20 0507 02/24/20 0453  AST 31 24 22   ALT 22 26 22   ALKPHOS 57 59 60  BILITOT 0.6 0.7 0.6  PROT 7.0 6.4* 7.0  ALBUMIN 3.6 3.4* 3.5    HbA1C: Hgb A1c MFr Bld  Date/Time Value Ref Range Status  02/19/2020 10:12 PM 5.6 4.8 - 5.6 % Final    Comment:    (NOTE) Pre diabetes:          5.7%-6.4%  Diabetes:              >6.4%  Glycemic control for   <7.0% adults with diabetes   09/17/2019 08:29 PM 5.8 (H) 4.8 - 5.6 % Final    Comment:    (NOTE) Pre diabetes:          5.7%-6.4% Diabetes:              >6.4% Glycemic control for   <7.0% adults with diabetes     CBG: Recent Labs  Lab 02/23/20 2055 02/24/20 0736 02/24/20 1114 02/27/20 2107 02/28/20 0751  GLUCAP 111* 92 106* 88 71    Recent Results (from the past 240 hour(s))  Blood Culture (routine x 2)     Status: None   Collection Time: 02/19/20  7:43 PM   Specimen: BLOOD  Result Value Ref Range Status   Specimen Description   Final    BLOOD BLOOD LEFT FOREARM Performed at [facility] [address], [place], [place] [address]    Special Requests   Final    BOTTLES DRAWN AEROBIC AND ANAEROBIC Blood Culture results may not be optimal due to an inadequate volume of blood  received in culture bottles Performed at [facility], [place] [address] [place], [place] [address]    Culture   Final    NO GROWTH 5 DAYS Performed at [facility], [place] [address] [place], [place] [address]    Report Status 02/24/2020 FINAL  Final  Urine culture     Status: Abnormal   Collection Time: 02/19/20  7:43 PM   Specimen: In/Out Cath Urine  Result Value Ref Range Status   Specimen Description   Final    IN/OUT CATH URINE Performed at [facility] [address], [place], [place] [address]    Special Requests   Final    NONE Performed at [facility], [place] [address], [place], [place] [address]    Culture >=100,000 COLONIES/mL KLEBSIELLA PNEUMONIAE (A)  Final   Report Status 02/21/2020 FINAL  Final   Organism ID, Bacteria KLEBSIELLA PNEUMONIAE (A)  Final      Susceptibility   Klebsiella pneumoniae - MIC*    AMPICILLIN RESISTANT Resistant     CEFAZOLIN <=4 SENSITIVE Sensitive     CEFTRIAXONE <=0.25 SENSITIVE Sensitive     CIPROFLOXACIN <=0.25 SENSITIVE Sensitive     GENTAMICIN <=1 SENSITIVE Sensitive     IMIPENEM <=0.25 SENSITIVE Sensitive     NITROFURANTOIN 32 SENSITIVE Sensitive     TRIMETH/SULFA <=20 SENSITIVE Sensitive     AMPICILLIN/SULBACTAM 4 SENSITIVE Sensitive     PIP/TAZO 8 SENSITIVE Sensitive     * >=100,000 COLONIES/mL KLEBSIELLA PNEUMONIAE  Blood Culture (routine x 2)     Status: None   Collection Time: 02/19/20  7:48 PM   Specimen: BLOOD  Result Value Ref Range Status   Specimen Description   Final    BLOOD BLOOD RIGHT FOREARM Performed at [facility] [address], [place], [place] [address]    Special Requests   Final    BOTTLES DRAWN AEROBIC AND ANAEROBIC Blood Culture adequate volume Performed at [facility] [address] [place], [place] [address]    Culture   Final    NO GROWTH 5 DAYS Performed at [facility], [place] [address], [place], [place] [address]    Report Status 02/24/2020 FINAL  Final  SARS Coronavirus 2 by RT PCR (hospital order, performed in [facility] hospital lab) Nasopharyngeal Nasopharyngeal Swab     Status: Abnormal   Collection Time: 02/19/20  8:22 PM   Specimen: Nasopharyngeal Swab  Result Value Ref Range Status   SARS Coronavirus 2 POSITIVE (A) NEGATIVE Final    Comment: RESULT CALLED TO, READ BACK BY AND VERIFIED WITH: [name] @ 2132 ON 02/19/20 C VARNER (NOTE) SARS-CoV-2 target nucleic acids are DETECTED  SARS-CoV-2 RNA is generally detectable in upper respiratory specimens  during the acute phase of infection.  Positive results are indicative  of the presence of the identified virus, but do not rule out bacterial infection or co-infection with other pathogens not detected by the test.  Clinical correlation with patient history and  other diagnostic information is necessary to determine patient infection status.  The expected result is negative.  Fact Sheet for Patients:   StrictlyIdeas.no   Fact Sheet for Healthcare Providers:   BankingDealers.co.za    This test is not yet approved or cleared by the [place] FDA and  has been authorized for detection and/or diagnosis of SARS-CoV-2 by FDA under an Emergency Use Authorization (EUA).  This EUA will remain in effect (meaning thi s test can be used) for the  duration of  the COVID-19 declaration under Section 564(b)(1) of the Act, 21 U.S.C. section 360-bbb-3(b)(1), unless the authorization is terminated or revoked sooner.  Performed at [facility], [place] [address], [place], [place] [address]      Scheduled Meds: . ascorbic acid  500 mg Oral Daily  . aspirin EC  325 mg Oral Daily  . atorvastatin  20 mg Oral q1800  . baclofen  40 mg Oral QID  . calcium citrate  200 mg of elemental calcium Oral Daily  . cholecalciferol  1,000 Units Oral Daily  . colchicine   0.6 mg Oral Daily  . enoxaparin (LOVENOX) injection  40 mg Subcutaneous Q24H  . fluticasone furoate-vilanterol  1 puff Inhalation Daily   And  . umeclidinium bromide  1 puff Inhalation Daily  . furosemide  40 mg Oral Daily  . gabapentin  300 mg Oral QHS  . guaiFENesin  1,200 mg Oral BID  . insulin aspart  0-15 Units Subcutaneous TID WC  . latanoprost  1 drop Both Eyes QHS  . levothyroxine  175 mcg Oral QAC breakfast  . mouth rinse  15 mL Mouth Rinse BID  . metoprolol succinate  25 mg Oral Daily  . multivitamin with minerals  1 tablet Oral Daily  . pantoprazole  40 mg Oral QPM  . polyethylene glycol  17 g Oral QODAY  . senna-docusate  2 tablet Oral QHS  . sodium chloride flush  3 mL Intravenous Q12H  . zinc sulfate  220 mg Oral Daily   Continuous Infusions: . sodium chloride       LOS: 1 day   Cherene Altes, MD [facility] Office  [phone] Pager - Text Page per Shea Evans  If 7PM-7AM, please contact night-coverage per Amion 02/28/2020, 10:03 AM

## 2020-02-29 LAB — CBC
HCT: 42.6 % (ref 39.0–52.0)
Hemoglobin: 13.5 g/dL (ref 13.0–17.0)
MCH: 27.1 pg (ref 26.0–34.0)
MCHC: 31.7 g/dL (ref 30.0–36.0)
MCV: 85.4 fL (ref 80.0–100.0)
Platelets: 269 10*3/uL (ref 150–400)
RBC: 4.99 MIL/uL (ref 4.22–5.81)
RDW: 15.6 % — ABNORMAL HIGH (ref 11.5–15.5)
WBC: 8.6 10*3/uL (ref 4.0–10.5)
nRBC: 0 % (ref 0.0–0.2)

## 2020-02-29 LAB — BASIC METABOLIC PANEL
Anion gap: 13 (ref 5–15)
BUN: 29 mg/dL — ABNORMAL HIGH (ref 8–23)
CO2: 31 mmol/L (ref 22–32)
Calcium: 8.8 mg/dL — ABNORMAL LOW (ref 8.9–10.3)
Chloride: 96 mmol/L — ABNORMAL LOW (ref 98–111)
Creatinine, Ser: 0.88 mg/dL (ref 0.61–1.24)
GFR calc Af Amer: >60 mL/min (ref >60)
GFR calc non Af Amer: >60 mL/min (ref >60)
Glucose, Bld: 79 mg/dL (ref 70–99)
Potassium: 3.6 mmol/L (ref 3.5–5.1)
Sodium: 140 mmol/L (ref 135–145)

## 2020-02-29 LAB — GLUCOSE, CAPILLARY
Glucose-Capillary: 87 mg/dL (ref 70–99)
Glucose-Capillary: 139 mg/dL — ABNORMAL HIGH (ref 70–99)
Glucose-Capillary: 106 mg/dL — ABNORMAL HIGH (ref 70–99)
Glucose-Capillary: 115 mg/dL — ABNORMAL HIGH (ref 70–99)

## 2020-02-29 NOTE — Progress Notes (Signed)
[name]  MWU:[id number] DOB: [birth year] DOA: 02/27/2020 PCP: [name], MD    Brief Narrative:  79 year old with a history of chronic hypoxic respiratory failure due to COPD requiring 2 L nasal cannula at baseline, chronic diastolic CHF, DM2, HTN, HLD, hypothyroidism, ascending thoracic aortic aneurysm, status post CEA, neurogenic bladder with chronic indwelling Foley catheter, spinal stenosis with chronic pain, 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  He completed 5 days of Remdesivir and was dosed with IV steroids.  At the time of his discharge he had been weaned back to his baseline 2 L of oxygen.  He was returned to the ER essentially because his wife was admitted with Covid and the patient is unable to care for himself at home.  Significant Events:  9/2 + Covid test 9/7 discharge home after inpatient stay for Covid 9/10 readmit to [facility] due to inability to care for himself at home  Date of Positive COVID Test:  02/19/20  Vaccination Status: [place] -last dose March 2021  COVID-19 specific Treatment: None presently  Antimicrobials:  None  DVT prophylaxis: Lovenox  Subjective: Vital signs stable.  Afebrile. Pt not examined today as he is stable.   Assessment & Plan:  Generalized weakness -inability to care for himself at home Continue supportive care in hospital  COVID Pneumonia Remains stable clinically from this standpoint - remains within 21-day isolation window  Chronic hypoxic respiratory failure due to COPD Continue usual home 2 L nasal cannula support -continue usual home inhaler therapies  Chronic diastolic CHF Well compensated at present  Essential HTN Blood pressure stable presently  DM2 - diet controlled CBG well controlled  Neurogenic bladder with chronic indwelling Foley  Sacral pressure wound - present on admission VAC  removed until wound care team can evaluate -utilize normal saline dressing in interim  Lower extremity pressure wounds -present on admission As above  Hypothyroidism Continue home Synthroid dose  HLD Continue home atorvastatin  Spinal stenosis with chronic pain and spasticity Continue usual home medical therapy  OSA  Functional quadriplegia   Code Status: NO CODE BLUE -DNR Family Communication:  Status is: Observation  The patient remains OBS appropriate and will d/c before 2 midnights.  Dispo: The patient is from: Home              Anticipated d/c is to: unclear presently              Anticipated d/c date is: 3 days              Patient currently is medically stable to d/c.  Consultants:  none  Objective: Blood pressure 106/69, pulse (!) 56, temperature 98 F (36.7 C), resp. rate 18, height 5\' 7"  (1.702 m), weight 90.7 kg, SpO2 93 %.  Intake/Output Summary (Last 24 hours) at 02/29/2020 0939 Last data filed at 02/29/2020 0500 Gross per 24 hour  Intake 135 ml  Output 1600 ml  Net -1465 ml   Filed Weights   02/27/20 1226 02/27/20 1409  Weight: 90.7 kg 90.7 kg    Examination:   CBC: Recent Labs  Lab 02/23/20 0507 02/23/20 0507 02/24/20 0453 02/24/20 0453 02/27/20 1320 02/28/20 0421 02/29/20 0428  WBC 7.0   < > 5.7   < > 8.7 8.8 8.6  NEUTROABS 5.5  --  4.5  --  6.6  --   --   HGB 12.3*   < >  13.2   < > 14.2 13.8 13.5  HCT 38.6*   < > 41.4   < > 43.9 43.1 42.6  MCV 85.8   < > 84.3   < > 83.6 84.7 85.4  PLT 186   < > 188   < > 309 298 269   < > = values in this interval not displayed.   Basic Metabolic Panel: Recent Labs  Lab 02/23/20 0507 02/23/20 0507 02/24/20 0453 02/24/20 0453 02/27/20 1320 02/28/20 0421 02/29/20 0428  NA 141   < > 140   < > 137 140 140  K 4.2   < > 4.2   < > 4.4 3.4* 3.6  CL 101   < > 102   < > 95* 95* 96*  CO2 28   < > 29   < > 30 30 31   GLUCOSE 100*   < > 114*   < > 87 73 79  BUN 28*   < > 25*   < > 29* 29* 29*   CREATININE 0.71   < > 0.63   < > 0.79 0.73 0.88  CALCIUM 8.7*   < > 8.7*   < > 8.5* 8.7* 8.8*  MG 2.3  --  2.3  --   --   --   --   PHOS 3.1  --  3.3  --   --   --   --    < > = values in this interval not displayed.   GFR: Estimated Creatinine Clearance: 74.3 mL/min (by C-G formula based on SCr of 0.88 mg/dL).  Liver Function Tests: Recent Labs  Lab 02/23/20 0507 02/24/20 0453  AST 24 22  ALT 26 22  ALKPHOS 59 60  BILITOT 0.7 0.6  PROT 6.4* 7.0  ALBUMIN 3.4* 3.5    HbA1C: Hgb A1c MFr Bld  Date/Time Value Ref Range Status  02/19/2020 10:12 PM 5.6 4.8 - 5.6 % Final    Comment:    (NOTE) Pre diabetes:          5.7%-6.4%  Diabetes:              >6.4%  Glycemic control for   <7.0% adults with diabetes   09/17/2019 08:29 PM 5.8 (H) 4.8 - 5.6 % Final    Comment:    (NOTE) Pre diabetes:          5.7%-6.4% Diabetes:              >6.4% Glycemic control for   <7.0% adults with diabetes     CBG: Recent Labs  Lab 02/28/20 0751 02/28/20 1112 02/28/20 1648 02/28/20 2006 02/29/20 0746  GLUCAP 71 81 83 140* 87    Recent Results (from the past 240 hour(s))  Blood Culture (routine x 2)     Status: None   Collection Time: 02/19/20  7:43 PM   Specimen: BLOOD  Result Value Ref Range Status   Specimen Description   Final    BLOOD BLOOD LEFT FOREARM Performed at [facility], [place] [address] [place], [place] [address]    Special Requests   Final    BOTTLES DRAWN AEROBIC AND ANAEROBIC Blood Culture results may not be optimal due to an inadequate volume of blood received in culture bottles Performed at [facility] [address] [place], [place] [address]    Culture   Final    NO GROWTH 5 DAYS Performed at [facility], [place] [address]  [place], [place] [address]    Report Status 02/24/2020 FINAL  Final  Urine culture     Status: Abnormal   Collection Time: 02/19/20  7:43 PM   Specimen: In/Out Cath Urine  Result Value  Ref Range Status   Specimen Description   Final    IN/OUT CATH URINE Performed at [facility] [address] [place], [place] [address]    Special Requests   Final    NONE Performed at [facility], [place] [address], [place], [place] [address]    Culture >=100,000 COLONIES/mL KLEBSIELLA PNEUMONIAE (A)  Final   Report Status 02/21/2020 FINAL  Final   Organism ID, Bacteria KLEBSIELLA PNEUMONIAE (A)  Final      Susceptibility   Klebsiella pneumoniae - MIC*    AMPICILLIN RESISTANT Resistant     CEFAZOLIN <=4 SENSITIVE Sensitive     CEFTRIAXONE <=0.25 SENSITIVE Sensitive     CIPROFLOXACIN <=0.25 SENSITIVE Sensitive     GENTAMICIN <=1 SENSITIVE Sensitive     IMIPENEM <=0.25 SENSITIVE Sensitive     NITROFURANTOIN 32 SENSITIVE Sensitive     TRIMETH/SULFA <=20 SENSITIVE Sensitive     AMPICILLIN/SULBACTAM 4 SENSITIVE Sensitive     PIP/TAZO 8 SENSITIVE Sensitive     * >=100,000 COLONIES/mL KLEBSIELLA PNEUMONIAE  Blood Culture (routine x 2)     Status: None   Collection Time: 02/19/20  7:48 PM   Specimen: BLOOD  Result Value Ref Range Status   Specimen Description   Final    BLOOD BLOOD RIGHT FOREARM Performed at [facility] [address], [place], [place] [address]    Special Requests   Final    BOTTLES DRAWN AEROBIC AND ANAEROBIC Blood Culture adequate volume Performed at [facility] [address], [place], [place] [address]    Culture   Final    NO GROWTH 5 DAYS Performed at [facility], [place] [address], [place], [place] [address]    Report Status 02/24/2020 FINAL  Final  SARS Coronavirus 2 by RT PCR (hospital order, performed in [facility] hospital lab) Nasopharyngeal Nasopharyngeal Swab     Status: Abnormal   Collection Time: 02/19/20  8:22 PM   Specimen: Nasopharyngeal Swab  Result Value Ref Range Status   SARS Coronavirus 2 POSITIVE (A) NEGATIVE Final    Comment: RESULT CALLED TO, READ  BACK BY AND VERIFIED WITH: ILENE HODGES @ 2132 ON 02/19/20 C VARNER (NOTE) SARS-CoV-2 target nucleic acids are DETECTED  SARS-CoV-2 RNA is generally detectable in upper respiratory specimens  during the acute phase of infection.  Positive results are indicative  of the presence of the identified virus, but do not rule out bacterial infection or co-infection with other pathogens not detected by the test.  Clinical correlation with patient history and  other diagnostic information is necessary to determine patient infection status.  The expected result is negative.  Fact Sheet for Patients:   StrictlyIdeas.no   Fact Sheet for Healthcare Providers:   BankingDealers.co.za    This test is not yet approved or cleared by the [place] FDA and  has been authorized for detection and/or diagnosis of SARS-CoV-2 by FDA under an Emergency Use Authorization (EUA).  This EUA will remain in effect (meaning thi s test can be used) for the duration of  the COVID-19 declaration under Section 564(b)(1) of the Act, 21 U.S.C. section 360-bbb-3(b)(1), unless the authorization is terminated or revoked sooner.  Performed at [facility], [address] [place], [place] [address]      Scheduled Meds: . ascorbic acid  500 mg Oral Daily  . aspirin EC  325 mg Oral Daily  . atorvastatin  20 mg Oral q1800  . baclofen  40 mg Oral QID  . calcium citrate  200 mg of elemental calcium Oral Daily  . Chlorhexidine Gluconate Cloth  6 each Topical Daily  . cholecalciferol  1,000 Units Oral Daily  . colchicine  0.6 mg Oral Daily  . enoxaparin (LOVENOX) injection  40 mg Subcutaneous Q24H  . fluticasone furoate-vilanterol  1 puff Inhalation Daily   And  . umeclidinium bromide  1 puff Inhalation Daily  . furosemide  40 mg Oral Daily  . gabapentin  300 mg Oral QHS  . guaiFENesin  1,200 mg Oral BID  . insulin aspart  0-15 Units Subcutaneous TID WC   . latanoprost  1 drop Both Eyes QHS  . levothyroxine  175 mcg Oral QAC breakfast  . mouth rinse  15 mL Mouth Rinse BID  . metoprolol succinate  25 mg Oral Daily  . multivitamin with minerals  1 tablet Oral Daily  . pantoprazole  40 mg Oral QPM  . polyethylene glycol  17 g Oral QODAY  . potassium chloride  40 mEq Oral Daily  . senna-docusate  2 tablet Oral QHS  . zinc sulfate  220 mg Oral Daily      LOS: 1 day   Cherene Altes, MD [facility] Office  [phone] Pager - Text Page per Amion  If 7PM-7AM, please contact night-coverage per Amion 02/29/2020, 9:39 AM

## 2020-02-29 NOTE — Evaluation (Signed)
Physical Therapy Evaluation Patient Details Name: [name] MRN: [id number] DOB: Sep 03, 1940 Today's Date: 02/29/2020   History of Present Illness  79 year old with a history of chronic hypoxic respiratory failure due to COPD requiring 2 L nasal cannula at baseline, chronic diastolic CHF, DM2, HTN, HLD, hypothyroidism, ascending thoracic aortic aneurysm, status post CEA, neurogenic bladder with chronic indwelling Foley catheter, spinal stenosis with chronic pain, 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  He completed 5 days of Remdesivir and was dosed with IV steroids.  At the time of his discharge he had been weaned back to his baseline 2 L of oxygen.  He was returned to the ER essentially because his wife was admitted with Covid and the patient is unable to care for himself at home.  Clinical Impression  Pt admitted with above diagnosis.  Pt is apparently at or very near his baseline for functional mobility. He is non-ambulatory and uses a lift to transfer to power w/c. Rolls with assist. He had caregivers 6 hrs per day at home and his wife also assisting, currently no caregiver assist available per pt (d/t staffing issues and the amount of care he requires), and his wife is hospitalized with covid as well.  Recommend SNF however pt states he "would rather be shot than go to a NH"   He is unfortunately a poor rehab candidate given he has limited motivation to self assist and is also  near total care at his baseline.  Will follow on a  trial basis. TOC team input will certainly be beneficial.   Pt currently with functional limitations due to the deficits listed below (see PT Problem List). Pt will benefit from skilled PT to increase their independence and safety with mobility to allow discharge to the venue listed below.     Follow Up Recommendations SNF    Equipment Recommendations  None  recommended by PT    Recommendations for Other Services       Precautions / Restrictions Precautions Precautions: Fall Precaution Comments: sacral pressure ulcer (pt states the wound is on his L hip, does not appear to be the case per obs of  dressing placement) Restrictions Weight Bearing Restrictions: No      Mobility  Bed Mobility Overal bed mobility: Needs Assistance Bed Mobility: Rolling Rolling: Mod assist;+2 for safety/equipment         General bed mobility comments: grossly mod assist to roll in both directions (this was level pt was at when he left CIR). pt requires mutli-modal cues to self assist with UEs, use rail. assisted NT with re-positioning pt and he was displeased with re-positioning  Transfers                 General transfer comment: pt reports he cannot sit up d/t pain from pressure ulcer  Ambulation/Gait                Stairs            Wheelchair Mobility    Modified Rankin (Stroke Patients Only)       Balance                                             Pertinent Vitals/Pain Pain Assessment: Faces Faces Pain Scale: Hurts  even more Pain Location: L hip Pain Descriptors / Indicators: Discomfort;Sore;Grimacing Pain Intervention(s): Limited activity within patient's tolerance;Monitored during session;Repositioned    Home Living Family/patient expects to be discharged to:: Unsure Living Arrangements: Spouse/significant other               Additional Comments: prior to hospital admission had PCA to assist with dressing and transfers 6 hrs per day, 3 am and 3 pm    Prior Function Level of Independence: Needs assistance   Gait / Transfers Assistance Needed: mechanical Lift to get to power chair/non-ambulatory prior to admission. ~mod assist for rolling at baseline  ADL's / Homemaking Assistance Needed: assist for dressing (including adult diaper changes), transfers for tub with shower chair, does  not do IADL  Comments: per pt his wife "broke both her legs" a few mos ago and is now walking with a cane. pt reports other family members assist as able but no one consistently. They currently don't have a caregiver per pt d/t staffing issues and the level of assist he requires     Hand Dominance        Extremity/Trunk Assessment   Upper Extremity Assessment Upper Extremity Assessment: Defer to OT evaluation;Generalized weakness    Lower Extremity Assessment Lower Extremity Assessment: Generalized weakness;RLE deficits/detail RLE Deficits / Details: bil LEs grossly WFL AAROM. pt is not fully able to perform MMT d/t c/o pain       Communication   Communication: No difficulties  Cognition Arousal/Alertness: Awake/alert Behavior During Therapy: WFL for tasks assessed/performed Overall Cognitive Status: Within Functional Limits for tasks assessed                                        General Comments      Exercises     Assessment/Plan    PT Assessment Patient needs continued PT services  PT Problem List Decreased strength;Decreased mobility;Decreased activity tolerance;Pain       PT Treatment Interventions Therapeutic exercise;Functional mobility training;Therapeutic activities;Patient/family education    PT Goals (Current goals can be found in the Care Plan section)  Acute Rehab PT Goals Patient Stated Goal: pt states he would rather be shot than go to a SNF PT Goal Formulation: With patient Time For Goal Achievement: 03/14/20    Frequency Min 2X/week   Barriers to discharge Decreased caregiver support      Co-evaluation               AM-PAC PT "6 Clicks" Mobility  Outcome Measure Help needed turning from your back to your side while in a flat bed without using bedrails?: A Lot Help needed moving from lying on your back to sitting on the side of a flat bed without using bedrails?: Total Help needed moving to and from a bed to a chair  (including a wheelchair)?: Total Help needed standing up from a chair using your arms (e.g., wheelchair or bedside chair)?: Total Help needed to walk in hospital room?: Total Help needed climbing 3-5 steps with a railing? : Total 6 Click Score: 7    End of Session   Activity Tolerance: Patient limited by pain Patient left: in bed;with call bell/phone within reach;with bed alarm set   PT Visit Diagnosis: Other abnormalities of gait and mobility (R26.89);Muscle weakness (generalized) (M62.81)    Time: 6073-7106 PT Time Calculation (min) (ACUTE ONLY): 24 min   Charges:   PT Evaluation $  PT Eval Low Complexity: 1 Low PT Treatments $Therapeutic Activity: 8-22 mins        Baxter Flattery, PT  Acute Rehab Dept [facility]) [phone] Pager [phone]  02/29/2020   [facility] 02/29/2020, 2:38 PM

## 2020-02-29 NOTE — Evaluation (Signed)
Occupational Therapy Evaluation Patient Details Name: [name] MRN: [id number] DOB: 10/27/1940 Today's Date: 02/29/2020    History of Present Illness 79 year old with a history of chronic hypoxic respiratory failure due to COPD requiring 2 L nasal cannula at baseline, chronic diastolic CHF, DM2, HTN, HLD, hypothyroidism, ascending thoracic aortic aneurysm, status post CEA, neurogenic bladder with chronic indwelling Foley catheter, spinal stenosis with chronic pain, 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  He completed 5 days of Remdesivir and was dosed with IV steroids.  At the time of his discharge he had been weaned back to his baseline 2 L of oxygen.  He was returned to the ER essentially because his wife was admitted with Covid and the patient is unable to care for himself at home.   Clinical Impression   [name] is a 79 year old man with above medical history who presents supine in bed. On evaluation patient demonstrates functional upper body strength though his shoulders are weak. He reports requiring max assist-total care - at baseline except that he can feed himself, lying on his side, if someone cuts his food up and can help with grooming and bathing. Reports he is bed bound and doesn't get out of bed except to go to the doctor. Patient's wife assists him at home and that he did have a CNA to help 6 hours a day but she now has breast cancer. Reports his family helps some as well but no consistent help and his wife is currently hospitalized with COVID. Patient reports he can't sit on the side of the bed or upright in bed due to pain from his pressure ulcer. Patient is at his baseline in regards to ADLs and his upper extremities and at this time has no OT needs. Patient requires 24/7 assistance at home or needs long term care placement.     Follow Up Recommendations   Supervision/Assistance - 24 hour;Other (comment)    Equipment Recommendations       Recommendations for Other Services       Precautions / Restrictions Precautions Precautions: Fall Precaution Comments: sacral pressure ulcer (pt states the wound is on his L hip, does not appear to be the case per obs of  dressing placement) Restrictions Weight Bearing Restrictions: No      Mobility Bed Mobility Overal bed mobility: Needs Assistance Bed Mobility: Rolling Rolling: Mod assist;+2 for safety/equipment         General bed mobility comments: Did not perform mobility during eval due to patient not agreeable.  Transfers                 General transfer comment: pt reports he cannot sit up d/t pain from pressure ulcer    Balance                                           ADL either performed or assessed with clinical judgement   ADL Overall ADL's : Needs assistance/impaired Eating/Feeding: Maximal assistance;Bed level Eating/Feeding Details (indicate cue type and reason): Reports nursing has been feeding him in the hospital as he is unable to reach or see food tray from reclined position.Refuses to raise HOB. reports feeing himself in sidelying at home. Grooming: Set up;Wash/dry hands;Wash/dry face;Bed level   Upper Body  Bathing: Maximal assistance;Bed level   Lower Body Bathing: Total assistance;Bed level   Upper Body Dressing : Moderate assistance;Bed level   Lower Body Dressing: Total assistance;Bed level     Toilet Transfer Details (indicate cue type and reason): unable Toileting- Clothing Manipulation and Hygiene: Total assistance;Bed level               Vision   Vision Assessment?: No apparent visual deficits     Perception     Praxis      Pertinent Vitals/Pain Pain Assessment: No/denies pain Faces Pain Scale: Hurts even more Pain Location: L hip Pain Descriptors / Indicators: Discomfort;Sore;Grimacing Pain Intervention(s):  Limited activity within patient's tolerance;Monitored during session;Repositioned     Hand Dominance Right   Extremity/Trunk Assessment Upper Extremity Assessment Upper Extremity Assessment: RUE deficits/detail;LUE deficits/detail RUE Deficits / Details: 4-/5 shoulder, 5/5 elbow, 5/5 wrist, 4/5 grip RUE Coordination: WNL LUE Deficits / Details: 3+/5 shoulder, 5/5 elbow, 5/5 wrist, 4/5 grip LUE Coordination: WNL   Lower Extremity Assessment Lower Extremity Assessment: Defer to PT evaluation RLE Deficits / Details: bil LEs grossly WFL AAROM. pt is not fully able to perform MMT d/t c/o pain       Communication Communication Communication: No difficulties   Cognition Arousal/Alertness: Awake/alert Behavior During Therapy: WFL for tasks assessed/performed Overall Cognitive Status: Within Functional Limits for tasks assessed                                     General Comments       Exercises     Shoulder Instructions      Home Living Family/patient expects to be discharged to:: Unsure Living Arrangements: Spouse/significant other                               Additional Comments: prior to hospital admission had PCA to assist with dressing and transfers 6 hrs per day, 3 am and 3 pm. But CNA has breast cancer and is currently not working. Reports his son (or son in law) helps some as well.      Prior Functioning/Environment Level of Independence: Needs assistance  Gait / Transfers Assistance Needed: mechanical Lift to get to power chair/non-ambulatory prior to admission. ~mod assist for rolling at baseline ADL's / Homemaking Assistance Needed: Able to feed himself in sidelying at home per patient. Able to assist with some UB ADLS but total care for toileting and LB ADLs. Repors he is unable to sit upright (at edge of bed or upright in bed due to pressure ulcer)   Comments: per pt his wife "broke both her legs" a few mos ago and is now walking with  a cane. pt reports other family members assist as able but no one consistently. They currently don't have a caregiver per pt d/t staffing issues and the level of assist he requires        OT Problem List: Decreased strength;Decreased activity tolerance;Pain      OT Treatment/Interventions:      OT Goals(Current goals can be found in the care plan section) Acute Rehab OT Goals Patient Stated Goal: pt states he would rather be shot than go to a SNF OT Goal Formulation: All assessment and education complete, DC therapy  OT Frequency:     Barriers to D/C:            Co-evaluation  AM-PAC OT "6 Clicks" Daily Activity     Outcome Measure Help from another person eating meals?: A Lot Help from another person taking care of personal grooming?: A Little Help from another person toileting, which includes using toliet, bedpan, or urinal?: Total Help from another person bathing (including washing, rinsing, drying)?: A Lot Help from another person to put on and taking off regular upper body clothing?: A Lot Help from another person to put on and taking off regular lower body clothing?: Total 6 Click Score: 11   End of Session Nurse Communication: Mobility status  Activity Tolerance: Patient tolerated treatment well Patient left: in bed;with call bell/phone within reach;with bed alarm set  OT Visit Diagnosis: Pain                Time: 7841-2820 OT Time Calculation (min): 10 min Charges:  OT General Charges $OT Visit: 1 Visit OT Evaluation $OT Eval Low Complexity: 1 Low  Genae Strine, OTR/L [place]  Office [phone] Pager: [phone]   Lenward Chancellor 02/29/2020, 4:52 PM

## 2020-03-01 DIAGNOSIS — R531 Weakness: Secondary | ICD-10-CM | POA: Diagnosis not present

## 2020-03-01 LAB — GLUCOSE, CAPILLARY
Glucose-Capillary: 100 mg/dL — ABNORMAL HIGH (ref 70–99)
Glucose-Capillary: 102 mg/dL — ABNORMAL HIGH (ref 70–99)
Glucose-Capillary: 96 mg/dL (ref 70–99)
Glucose-Capillary: 130 mg/dL — ABNORMAL HIGH (ref 70–99)

## 2020-03-01 LAB — SARS CORONAVIRUS 2 BY RT PCR (HOSPITAL ORDER, PERFORMED IN ~~LOC~~ HOSPITAL LAB): SARS Coronavirus 2: POSITIVE — AB

## 2020-03-01 MED ORDER — POLYETHYLENE GLYCOL 3350 17 G PO PACK
17.0000 g | PACK | Freq: Two times a day (BID) | ORAL | Status: DC
  Administered 2020-03-01 – 2020-03-05 (×7): 17 g via ORAL
  Filled 2020-03-01 (×7): qty 1

## 2020-03-01 NOTE — Progress Notes (Signed)
Patient refusing to eat breakfast tray d/t no appetite. States will eat when lunch tray is delivered. Took meds fine whole this morning. Tolerating room air with no signs of discomfort and O2 at 93%.

## 2020-03-01 NOTE — NC FL2 (Signed)
[place] LEVEL OF CARE SCREENING TOOL     IDENTIFICATION  Patient Name: [name] Birthdate: 03/03/1941 Sex: male Admission Date (Current Location): 02/27/2020  [facility] and [place] Number:  Herbalist and Address:  [facility],  [address] [place], [place]      Provider Number: [id number]  Attending Physician Name and Address:  Cherene Altes, MD  Relative Name and Phone Number:       Current Level of Care: Hospital Recommended Level of Care: [place] Prior Approval Number:    Date Approved/Denied:   PASRR Number: [id number]  Discharge Plan: SNF    Current Diagnoses: Patient Active Problem List   Diagnosis Date Noted  . Generalized weakness 02/27/2020  . 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 Pneumonia due to COVID-19 virus 02/20/2020  . Acute hypoxemic respiratory failure due to COVID-19 ([place]) 02/19/2020  . Hyperlipidemia associated with type 2 diabetes mellitus ([place]) 02/19/2020  . Chronic indwelling Foley catheter 02/19/2020  . COPD (chronic obstructive pulmonary disease) ([place]) 02/19/2020  . Educated about COVID-19 virus infection 01/22/2020  . 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 Hypothyroidism   . Palliative care by specialist   . DNR (do not resuscitate)   . Acute on chronic respiratory failure with hypoxia ([place]) 10/29/2019  . Paraplegia ([place]) 10/28/2019  . Labile blood pressure   . Spasticity   . Muscle spasm   . Hypoxia   . Supplemental oxygen dependent   . Neurogenic bladder   . Chronic diastolic congestive heart failure ([place])   . Neurogenic bowel   . Hypocalcemia   . Spinal stenosis of lumbar region   . Cervical myelopathy ([place]) 09/26/2019  . Diabetes mellitus type 2, controlled, with complications ([place]) 24/23/5361  . Obese 09/17/2019  . Thoracic aortic aneurysm ([place]) 09/17/2019  . Bowel incontinence 09/17/2019  . Bladder incontinence 09/17/2019  . Right  hemiparesis ([place]) 09/17/2019  . Acute renal failure (ARF) ([place]) 09/17/2019  . Left hemiparesis ([place]) 09/17/2019  . Chronic respiratory failure with hypoxia ([place]) 09/17/2019  . Syncope and collapse 09/17/2019  . Chronic diastolic CHF (congestive heart failure) ([place]) 09/04/2019  . PVD (peripheral vascular disease) ([place]) 02/14/2019  . Hypertension associated with diabetes ([place]) 02/13/2019  . Non-insulin treated type 2 diabetes mellitus ([place]) 02/13/2019  . Community acquired pneumonia of right lower lobe of lung 01/17/2019  . Postural urinary incontinence 05/20/2018  . Myelopathy concurrent with and due to spinal stenosis of cervical region ([place]) 05/20/2018  . Myoclonic jerking 05/20/2018  . 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 History of respiratory failure 08/18/2017  . Hypokalemia 08/18/2017  . Leukocytosis 08/18/2017  . Muscle atrophy of lower extremity 08/09/2017  . Bowel and bladder incontinence 08/09/2017  . Right-sided muscle weakness 08/09/2017  . Neuropathy 08/09/2017  . 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/2017  . Abnormality of gait 07/12/2017  . Myoclonic jerkings, 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/2017  . Acquired right foot drop 07/12/2017  . UTI (urinary tract infection) due to Enterococcus 07/12/2017  . Pressure ulcer 06/21/2017  . Acute cystitis 06/20/2017  . Weakness generalized 06/20/2017  . Dehydration 06/20/2017  . Dyspnea 12/26/2016  . Chronic cough 12/26/2016  . Hypersomnia with sleep apnea 10/30/2016  . CSA (central sleep apnea) 10/30/2016  . Wheezing symptom 10/30/2016  . 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 RLS (restless legs syndrome) 08/25/2014  . Primary gout 08/25/2014  . OSA on  CPAP 08/25/2014  . Severe obesity (BMI >= 40) ([place]) 08/25/2014  . Obesity (BMI 30-39.9) 02/18/2013  . 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 S/P TKR (total knee  replacement) 11/29/2012  . History of thyroid cancer     Orientation RESPIRATION BLADDER Height & Weight     Self, Time, Situation, Place  Normal Incontinent Weight: 90.7 kg Height:  5\' 7"  (170.2 cm)  BEHAVIORAL SYMPTOMS/MOOD NEUROLOGICAL BOWEL NUTRITION STATUS      Incontinent Diet  AMBULATORY STATUS COMMUNICATION OF NEEDS Skin   Extensive Assist Verbally PU Stage and Appropriate Care (unstageable ulcer to coccyx)                       Personal Care Assistance Level of Assistance  Bathing, Dressing, Total care Bathing Assistance: Maximum assistance   Dressing Assistance: Maximum assistance Total Care Assistance: Maximum assistance   Functional Limitations Info             SPECIAL CARE FACTORS FREQUENCY  PT (By licensed PT), OT (By licensed OT)     PT Frequency: 5x weekly OT Frequency: 5x weekly            Contractures Contractures Info: Not present    Additional Factors Info  Code Status, Allergies Code Status Info: DNR Allergies Info: NKDA           Current Medications (03/01/2020):  This is the current hospital active medication list Current Facility-Administered Medications  Medication Dose Route Frequency Provider Last Rate Last Admin  . acetaminophen (TYLENOL) tablet 650 mg  650 mg Oral Q6H PRN Agbata, Tochukwu, MD      . albuterol (VENTOLIN HFA) 108 (90 Base) MCG/ACT inhaler 1 puff  1 puff Inhalation Q4H PRN Agbata, Tochukwu, MD      . ascorbic acid (VITAMIN C) tablet 500 mg  500 mg Oral Daily Agbata, Tochukwu, MD   500 mg at 03/01/20 0933  . aspirin EC tablet 325 mg  325 mg Oral Daily Agbata, Tochukwu, MD   325 mg at 03/01/20 0933  . atorvastatin (LIPITOR) tablet 20 mg  20 mg Oral q1800 Agbata, Tochukwu, MD   20 mg at 02/29/20 1737  . baclofen (LIORESAL) tablet 40 mg  40 mg Oral QID Agbata, Tochukwu, MD   40 mg at 03/01/20 1342  . calcium citrate (CALCITRATE - dosed in mg elemental calcium) tablet 200 mg of elemental calcium  200 mg of elemental  calcium Oral Daily Agbata, Tochukwu, MD   200 mg of elemental calcium at 03/01/20 0931  . Chlorhexidine Gluconate Cloth 2 % PADS 6 each  6 each Topical Daily Cherene Altes, MD   6 each at 03/01/20 [phone]  . cholecalciferol (VITAMIN D) tablet 1,000 Units  1,000 Units Oral Daily Agbata, Tochukwu, MD   1,000 Units at 03/01/20 0932  . colchicine tablet 0.6 mg  0.6 mg Oral Daily Agbata, Tochukwu, MD   0.6 mg at 03/01/20 0933  . docusate sodium (COLACE) capsule 100 mg  100 mg Oral BID PRN Agbata, Tochukwu, MD      . enoxaparin (LOVENOX) injection 40 mg  40 mg Subcutaneous Q24H Agbata, Tochukwu, MD   40 mg at 02/29/20 2136  . fluticasone furoate-vilanterol (BREO ELLIPTA) 100-25 MCG/INH 1 puff  1 puff Inhalation Daily Polly Cobia, RPH   1 puff at 03/01/20 4782   And  . umeclidinium bromide (INCRUSE ELLIPTA) 62.5 MCG/INH 1 puff  1 puff Inhalation Daily  Wofford, Drew A, RPH      . furosemide (LASIX) tablet 40 mg  40 mg Oral Daily Agbata, Tochukwu, MD   40 mg at 03/01/20 0933  . gabapentin (NEURONTIN) capsule 300 mg  300 mg Oral QHS Agbata, Tochukwu, MD   300 mg at 02/29/20 2135  . guaiFENesin (MUCINEX) 12 hr tablet 1,200 mg  1,200 mg Oral BID Agbata, Tochukwu, MD   1,200 mg at 03/01/20 0931  . guaiFENesin-dextromethorphan (ROBITUSSIN DM) 100-10 MG/5ML syrup 5 mL  5 mL Oral Q4H PRN Cherene Altes, MD   5 mL at 02/29/20 1257  . HYDROcodone-acetaminophen (NORCO/VICODIN) 5-325 MG per tablet 1 tablet  1 tablet Oral Q6H PRN Agbata, Tochukwu, MD   1 tablet at 02/29/20 2152  . insulin aspart (novoLOG) injection 0-15 Units  0-15 Units Subcutaneous TID WC Agbata, Tochukwu, MD   2 Units at 02/29/20 1221  . latanoprost (XALATAN) 0.005 % ophthalmic solution 1 drop  1 drop Both Eyes QHS Agbata, Tochukwu, MD   1 drop at 02/29/20 2138  . levothyroxine (SYNTHROID) tablet 175 mcg  175 mcg Oral QAC breakfast Agbata, Tochukwu, MD   175 mcg at 03/01/20 0636  . MEDLINE mouth rinse  15 mL Mouth Rinse BID Agbata,  Tochukwu, MD   15 mL at 03/01/20 0934  . methocarbamol (ROBAXIN) tablet 750 mg  750 mg Oral Q6H PRN Agbata, Tochukwu, MD   750 mg at 02/29/20 2151  . metoprolol succinate (TOPROL-XL) 24 hr tablet 25 mg  25 mg Oral Daily Agbata, Tochukwu, MD   25 mg at 03/01/20 0933  . multivitamin with minerals tablet 1 tablet  1 tablet Oral Daily Agbata, Tochukwu, MD   1 tablet at 03/01/20 0934  . nitroGLYCERIN (NITROSTAT) SL tablet 0.4 mg  0.4 mg Sublingual Q5 min PRN Agbata, Tochukwu, MD      . ondansetron (ZOFRAN) tablet 4 mg  4 mg Oral Q6H PRN Agbata, Tochukwu, MD       Or  . ondansetron (ZOFRAN) injection 4 mg  4 mg Intravenous Q6H PRN Agbata, Tochukwu, MD      . pantoprazole (PROTONIX) EC tablet 40 mg  40 mg Oral QPM Agbata, Tochukwu, MD   40 mg at 02/29/20 1737  . polyethylene glycol (MIRALAX / GLYCOLAX) packet 17 g  17 g Oral BID Joette Catching T, MD      . potassium chloride SA (KLOR-CON) CR tablet 40 mEq  40 mEq Oral Daily Cherene Altes, MD   40 mEq at 03/01/20 0933  . senna-docusate (Senokot-S) tablet 2 tablet  2 tablet Oral QHS Agbata, Tochukwu, MD   2 tablet at 02/29/20 2135  . sodium chloride (OCEAN) 0.65 % nasal spray 1 spray  1 spray Each Nare PRN Cherene Altes, MD   1 spray at 02/28/20 2059  . SUMAtriptan (IMITREX) tablet 50 mg  50 mg Oral Q2H PRN Agbata, Tochukwu, MD      . zinc sulfate capsule 220 mg  220 mg Oral Daily Agbata, Tochukwu, MD   220 mg at 03/01/20 2993     Discharge Medications: Please see discharge summary for a list of discharge medications.  Relevant Imaging Results:  Relevant Lab Results:   Additional Information SS# [place]  Alvira Monday Hassan Rowan, RN

## 2020-03-01 NOTE — TOC Initial Note (Signed)
Transition of Care [facility]) - Initial/Assessment Note    Patient Details  Name: [name] MRN: [id number] Date of Birth: June 26, 1940  Transition of Care [facility]) CM/SW Contact:    Joaquin Courts, RN Phone Number: 03/01/2020, 11:43 AM  Clinical Narrative:                 CM reached out to patient's spouse who is his primary caregiver to discuss discharge planning.  Patient's spouse is currently hospitalized with Covid and extremely short of breath and unable to continue discussion.  CM was referred to patient's daughter for dc planning.  Daughter reports patient normally lives at home and has caregivers that come into the home daily as well as her husband who assist with adl's.  One of the patient's caregivers became infected with Covid and they believe subsequently infected the patient and spouse and this has since spread to the rest of the family.   Once patient became Covid+, the agency supplying caregivers could no longer send people into the home.  Daughter reports that they feel SNF is not a good option for patient, state that rehab is not appropriate in his current condition.  Patient is almost total care at baseline and requires a lift for transfers.  Daughter is working on arranging caregivers for patient and working with family members to see if they can provide 24/7 coverage until his wife returns home.  Daughter requests a follow-up Covid test for patient.  A negative covid test is needed to agency to send caregivers into the home.  CM messaged MD regarding need for updated Covid test.  CM will follow-up with family to see what arrangements have been made for patient's return home.    Expected Discharge Plan: [place]     Patient Goals and CMS Choice Patient states their goals for this hospitalization and ongoing recovery are:: to go home with caregivers      Expected Discharge Plan and Services Expected Discharge Plan: [place]   Discharge  Planning Services: CM Consult   Living arrangements for the past 2 months: Single Family Home                 DME Arranged: N/A DME Agency: NA                  Prior Living Arrangements/Services Living arrangements for the past 2 months: Single Family Home   Patient language and need for interpreter reviewed:: Yes Do you feel safe going back to the place where you live?: Yes      Need for Family Participation in Patient Care: Yes (Comment) Care giver support system in place?: Yes (comment)   Criminal Activity/Legal Involvement Pertinent to Current Situation/Hospitalization: No - Comment as needed  Activities of Daily Living Home Assistive Devices/Equipment: Shower chair without back, Wheelchair, Environmental consultant (specify type), Nebulizer, Dentures (specify type), Eyeglasses, [employer], Grab bars in shower, Hand-held shower hose, Grab bars around toilet, Other (Comment), CPAP (FWW, ramp entrance, wound vac, handicap toilet, neb,) ADL Screening (condition at time of admission) Patient's cognitive ability adequate to safely complete daily activities?: No Is the patient deaf or have difficulty hearing?: Yes Does the patient have difficulty seeing, even when wearing glasses/contacts?: Yes Does the patient have difficulty concentrating, remembering, or making decisions?: No Patient able to express need for assistance with ADLs?: Yes Does the patient have difficulty dressing or bathing?: Yes Independently performs ADLs?: No Does the patient have difficulty  walking or climbing stairs?: Yes (secondary to paraplegia) Weakness of Legs: Both Weakness of Arms/Hands: Both  Permission Sought/Granted                  Emotional Assessment Appearance:: Appears stated age         Psych Involvement: No (comment)  Admission diagnosis:  Generalized weakness [R53.1] COVID-19 virus infection [U07.1] Patient Active Problem List   Diagnosis Date Noted  . Generalized weakness 02/27/2020  .  Functional quadriplegia ([place]) 02/27/2020  . Pneumonia due to COVID-19 virus 02/20/2020  . Acute hypoxemic respiratory failure due to COVID-19 ([place]) 02/19/2020  . Hyperlipidemia associated with type 2 diabetes mellitus ([place]) 02/19/2020  . Chronic indwelling Foley catheter 02/19/2020  . COPD (chronic obstructive pulmonary disease) ([place]) 02/19/2020  . Educated about COVID-19 virus infection 01/22/2020  . 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 Hypothyroidism   . Palliative care by specialist   . DNR (do not resuscitate)   . Acute on chronic respiratory failure with hypoxia ([place]) 10/29/2019  . Paraplegia ([place]) 10/28/2019  . Labile blood pressure   . Spasticity   . Muscle spasm   . Hypoxia   . Supplemental oxygen dependent   . Neurogenic bladder   . Chronic diastolic congestive heart failure ([place])   . Neurogenic bowel   . Hypocalcemia   . Spinal stenosis of lumbar region   . Cervical myelopathy ([place]) 09/26/2019  . Diabetes mellitus type 2, controlled, with complications ([place]) 76/54/6503  . Obese 09/17/2019  . Thoracic aortic aneurysm ([place]) 09/17/2019  . Bowel incontinence 09/17/2019  . Bladder incontinence 09/17/2019  . Right hemiparesis ([place]) 09/17/2019  . Acute renal failure (ARF) ([place]) 09/17/2019  . Left hemiparesis ([place]) 09/17/2019  . Chronic respiratory failure with hypoxia ([place]) 09/17/2019  . Syncope and collapse 09/17/2019  . Chronic diastolic CHF (congestive heart failure) ([place]) 09/04/2019  . PVD (peripheral vascular disease) ([place]) 02/14/2019  . Hypertension associated with diabetes ([place]) 02/13/2019  . Non-insulin treated type 2 diabetes mellitus ([place]) 02/13/2019  . Community acquired pneumonia of right lower lobe of lung 01/17/2019  . Postural urinary incontinence 05/20/2018  . Myelopathy concurrent with and due to spinal stenosis of cervical region ([place]) 05/20/2018  . Myoclonic jerking 05/20/2018  . 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 History of respiratory failure 08/18/2017  . Hypokalemia 08/18/2017  . Leukocytosis 08/18/2017  . Muscle atrophy of lower extremity 08/09/2017  . Bowel and bladder incontinence 08/09/2017  . Right-sided muscle weakness 08/09/2017  . Neuropathy 08/09/2017  . 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/2017  . Abnormality of gait 07/12/2017  . Myoclonic jerkings, 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/2017  . Acquired right foot drop 07/12/2017  . UTI (urinary tract infection) due to Enterococcus 07/12/2017  . Pressure ulcer 06/21/2017  . Acute cystitis 06/20/2017  . Weakness generalized 06/20/2017  . Dehydration 06/20/2017  . Dyspnea 12/26/2016  . Chronic cough 12/26/2016  . Hypersomnia with sleep apnea 10/30/2016  . CSA (central sleep apnea) 10/30/2016  . Wheezing symptom 10/30/2016  . 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 RLS (restless legs syndrome) 08/25/2014  . Primary gout 08/25/2014  . OSA on CPAP 08/25/2014  . Severe obesity (BMI >= 40) ([place]) 08/25/2014  . Obesity (BMI 30-39.9) 02/18/2013  . 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 S/P TKR (total knee replacement) 11/29/2012  . History of thyroid cancer    PCP:  [name],  Quillian Quince, MD Pharmacy:   [place], [address] [place] [address] Phone: [phone] Fax: [phone]     Social Determinants of Health (SDOH) Interventions    Readmission Risk Interventions No flowsheet data found.

## 2020-03-01 NOTE — Progress Notes (Signed)
[name]  GYJ:[id number] DOB: Feb 04, 1941 DOA: 02/27/2020 PCP: [name], MD    Brief Narrative:  79 year old with a history of chronic hypoxic respiratory failure due to COPD requiring 2 L nasal cannula at baseline, chronic diastolic CHF, DM2, HTN, HLD, hypothyroidism, ascending thoracic aortic aneurysm, status post CEA, neurogenic bladder with chronic indwelling Foley catheter, spinal stenosis with chronic pain, 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  He completed 5 days of Remdesivir and was dosed with IV steroids.  At the time of his discharge he had been weaned back to his baseline 2 L of oxygen.  He was returned to the ER essentially because his wife was admitted with Covid and the patient is unable to care for himself at home.  Significant Events:  9/2 + Covid test 9/7 discharge home after inpatient stay for Covid 9/10 readmit to [facility] due to inability to care for himself at home  Date of Positive COVID Test:  02/19/20  Vaccination Status: Dasher -last dose March 2021  COVID-19 specific Treatment: None presently  Antimicrobials:  None  DVT prophylaxis: Lovenox  Subjective: Patient complains of constipation that is not responded to his current bowel regimen.  He has no new complaints otherwise.  He denies shortness of breath fevers chills nausea or vomiting.  Repeat Covid testing was obtained not for clinical reasons but in hopes of clearing the way for home health services to visit him at home to facilitate his discharge from the hospital.  Assessment & Plan:  Generalized weakness -inability to care for himself at home Continue supportive care in hospital  COVID Pneumonia Remains stable clinically from this standpoint - remains within 21-day isolation window  Chronic hypoxic respiratory failure due to COPD Continue usual home 2 L nasal  cannula support -continue usual home inhaler therapies  Chronic diastolic CHF Well compensated at present  Essential HTN Blood pressure stable presently  DM2 - diet controlled CBG well controlled  Neurogenic bladder with chronic indwelling Foley  Sacral pressure wound - present on admission VAC removed until wound care team can evaluate -utilize normal saline dressing in interim  Lower extremity pressure wounds -present on admission As above  Hypothyroidism Continue home Synthroid dose  HLD Continue home atorvastatin  Spinal stenosis with chronic pain and spasticity Continue usual home medical therapy  OSA  Functional quadriplegia   Code Status: NO CODE BLUE -DNR Family Communication:  Status is: Observation  The patient remains OBS appropriate and will d/c before 2 midnights.  Dispo: The patient is from: Home              Anticipated d/c is to: unclear presently              Anticipated d/c date is: 3 days              Patient currently is medically stable to d/c.  Consultants:  none  Objective: Blood pressure (!) 144/78, pulse (!) 53, temperature 97.7 F (36.5 C), temperature source Oral, resp. rate 17, height 5\' 7"  (1.702 m), weight 90.7 kg, SpO2 94 %.  Intake/Output Summary (Last 24 hours) at 03/01/2020 0935 Last data filed at 03/01/2020 0654 Gross per 24 hour  Intake --  Output 1350 ml  Net -1350 ml   Filed Weights   02/27/20 1226 02/27/20 1409  Weight: 90.7 kg 90.7 kg    Examination: Lungs: mild faint  scattered crackles - no wheeze CV: RRR Abdom: soft, protuberant but no rebound, BS+ Ext: trace edema B LE   CBC: Recent Labs  Lab 02/24/20 0453 02/24/20 0453 02/27/20 1320 02/28/20 0421 02/29/20 0428  WBC 5.7   < > 8.7 8.8 8.6  NEUTROABS 4.5  --  6.6  --   --   HGB 13.2   < > 14.2 13.8 13.5  HCT 41.4   < > 43.9 43.1 42.6  MCV 84.3   < > 83.6 84.7 85.4  PLT 188   < > 309 298 269   < > = values in this interval not displayed.   Basic  Metabolic Panel: Recent Labs  Lab 02/24/20 0453 02/24/20 0453 02/27/20 1320 02/28/20 0421 02/29/20 0428  NA 140   < > 137 140 140  K 4.2   < > 4.4 3.4* 3.6  CL 102   < > 95* 95* 96*  CO2 29   < > 30 30 31   GLUCOSE 114*   < > 87 73 79  BUN 25*   < > 29* 29* 29*  CREATININE 0.63   < > 0.79 0.73 0.88  CALCIUM 8.7*   < > 8.5* 8.7* 8.8*  MG 2.3  --   --   --   --   PHOS 3.3  --   --   --   --    < > = values in this interval not displayed.   GFR: Estimated Creatinine Clearance: 74.3 mL/min (by C-G formula based on SCr of 0.88 mg/dL).  Liver Function Tests: Recent Labs  Lab 02/24/20 0453  AST 22  ALT 22  ALKPHOS 60  BILITOT 0.6  PROT 7.0  ALBUMIN 3.5    HbA1C: Hgb A1c MFr Bld  Date/Time Value Ref Range Status  02/19/2020 10:12 PM 5.6 4.8 - 5.6 % Final    Comment:    (NOTE) Pre diabetes:          5.7%-6.4%  Diabetes:              >6.4%  Glycemic control for   <7.0% adults with diabetes   09/17/2019 08:29 PM 5.8 (H) 4.8 - 5.6 % Final    Comment:    (NOTE) Pre diabetes:          5.7%-6.4% Diabetes:              >6.4% Glycemic control for   <7.0% adults with diabetes     CBG: Recent Labs  Lab 02/29/20 0746 02/29/20 1128 02/29/20 1618 02/29/20 2009 03/01/20 0746  GLUCAP 87 139* 106* 115* 100*    No results found for this or any previous visit (from the past 240 hour(s)).   Scheduled Meds: . ascorbic acid  500 mg Oral Daily  . aspirin EC  325 mg Oral Daily  . atorvastatin  20 mg Oral q1800  . baclofen  40 mg Oral QID  . calcium citrate  200 mg of elemental calcium Oral Daily  . Chlorhexidine Gluconate Cloth  6 each Topical Daily  . cholecalciferol  1,000 Units Oral Daily  . colchicine  0.6 mg Oral Daily  . enoxaparin (LOVENOX) injection  40 mg Subcutaneous Q24H  . fluticasone furoate-vilanterol  1 puff Inhalation Daily   And  . umeclidinium bromide  1 puff Inhalation Daily  . furosemide  40 mg Oral Daily  . gabapentin  300 mg Oral QHS  .  guaiFENesin  1,200 mg Oral BID  . insulin aspart  0-15 Units Subcutaneous TID WC  . latanoprost  1 drop Both Eyes QHS  . levothyroxine  175 mcg Oral QAC breakfast  . mouth rinse  15 mL Mouth Rinse BID  . metoprolol succinate  25 mg Oral Daily  . multivitamin with minerals  1 tablet Oral Daily  . pantoprazole  40 mg Oral QPM  . polyethylene glycol  17 g Oral QODAY  . potassium chloride  40 mEq Oral Daily  . senna-docusate  2 tablet Oral QHS  . zinc sulfate  220 mg Oral Daily      LOS: 0 days   Cherene Altes, MD [facility] Office  [phone] Pager - Text Page per Amion  If 7PM-7AM, please contact night-coverage per Amion 03/01/2020, 9:35 AM

## 2020-03-02 DIAGNOSIS — R531 Weakness: Secondary | ICD-10-CM | POA: Diagnosis not present

## 2020-03-02 LAB — GLUCOSE, CAPILLARY
Glucose-Capillary: 98 mg/dL (ref 70–99)
Glucose-Capillary: 93 mg/dL (ref 70–99)
Glucose-Capillary: 75 mg/dL (ref 70–99)
Glucose-Capillary: 125 mg/dL — ABNORMAL HIGH (ref 70–99)

## 2020-03-02 MED ORDER — ENSURE ENLIVE PO LIQD
237.0000 mL | Freq: Two times a day (BID) | ORAL | Status: DC
  Administered 2020-03-03 – 2020-03-04 (×2): 237 mL via ORAL

## 2020-03-02 MED ORDER — PROSOURCE PLUS PO LIQD
30.0000 mL | Freq: Two times a day (BID) | ORAL | Status: DC
  Administered 2020-03-04 – 2020-03-05 (×3): 30 mL via ORAL
  Filled 2020-03-02 (×3): qty 30

## 2020-03-02 NOTE — Progress Notes (Signed)
Dressing to Stage 4 Pressure ulcer to sacrum changed. Cleansed with NS, pat dry, aquacel applied, gauze, abdpad, and paper tape to secure. Patient tolerated well, no discomfort.

## 2020-03-02 NOTE — TOC Progression Note (Addendum)
Transition of Care [facility]) - Progression Note    Patient Details  Name: DEQUINCY BORN MRN: [id number] Date of Birth: January 04, 1941  Transition of Care [facility]) CM/SW [place], Kisha Messman [place] Phone Number: 03/02/2020, 9:41 AM  Clinical Narrative:   Spoke to daughter to confirm plan of SNF since patient is still positive for COVID. She confirmed.  I let her know [facility] is reviewing for possible admission, and that I would call other facilities that take COVID positive patients. Will also initiate Insurance auth. TOC will continue to follow during the course of hospitalization.  Bed offer from [facility].  They can take patient tomorrow.  Daughter informed.  She asked that I have RN call her next time they are working with him in his room.  MD informed. Initiated Ship broker.  Refernce # [id number]     Expected Discharge Plan: Skilled Nursing Facility Barriers to Discharge: SNF Pending bed offer  Expected Discharge Plan and Services Expected Discharge Plan: [place]   Discharge Planning Services: CM Consult   Living arrangements for the past 2 months: Single Family Home                 DME Arranged: N/A DME Agency: NA                   Social Determinants of Health (SDOH) Interventions    Readmission Risk Interventions No flowsheet data found.

## 2020-03-02 NOTE — Progress Notes (Signed)
Initial Nutrition Assessment  DOCUMENTATION CODES:   Obesity unspecified  INTERVENTION:   -Ensure Enlive po BID, each supplement provides 350 kcal and 20 grams of protein -Prosource Plus PO BID, each provides 100 kcals and 15g protein  NUTRITION DIAGNOSIS:   Increased nutrient needs related to acute illness, wound healing (COVID-19 infection) as evidenced by estimated needs.  GOAL:   Patient will meet greater than or equal to 90% of their needs  MONITOR:   PO intake, Supplement acceptance, Weight trends, Labs, I & O's, Skin  REASON FOR ASSESSMENT:   Malnutrition Screening Tool    ASSESSMENT:   79 year old with a history of chronic hypoxic respiratory failure due to COPD requiring 2 L nasal cannula at baseline, chronic diastolic CHF, DM2, HTN, HLD, hypothyroidism, ascending thoracic aortic aneurysm, status post CEA, neurogenic bladder with chronic indwelling Foley catheter, spinal stenosis with chronic pain, 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  Patient has been positive for COVID-19 since 9/2.   Pt has had poor PO since symptoms began. PO documented showed pt consumed 60-100% of meals on 9/11. 0% meal completions on 9/13 and so far this morning. Pt was constipated and placed on bowel regimen. Has since had 2 small BMs.  Pt is bedbound with functional quadriplegia d/t severe spinal stenosis. Pt has a stage 4 sacral pressure injury. Will order Ensure supplements and Prosource protein supplements to aid in healing.  Per weight records, pt has lost 16 lbs since 6/8 (7% wt loss x 3 months, which is considered insignificant for time frame).   Medications: Vitamin C, Calcium citrate, Vitamin D, Lasix, Multivitamin with minerals daily, Miralax, KLOR-CON, Senokot, Zinc sulfate  Labs reviewed:  CBGs: 98-130  NUTRITION - FOCUSED PHYSICAL EXAM:  Unable to complete  Diet  Order:   Diet Order            Diet regular Room service appropriate? Yes; Fluid consistency: Thin  Diet effective now                 EDUCATION NEEDS:   No education needs have been identified at this time  Skin:  Skin Assessment: Skin Integrity Issues: Skin Integrity Issues:: Stage IV Stage IV: sacrum -per WOC note  Last BM:  9/14 -type 6  Height:   Ht Readings from Last 1 Encounters:  02/27/20 5\' 7"  (1.702 m)    Weight:   Wt Readings from Last 1 Encounters:  02/27/20 90.7 kg    BMI:  Body mass index is 31.32 kg/m.  Estimated Nutritional Needs:   Kcal:  2100-2300  Protein:  95-105g  Fluid:  2.1L/day  Clayton Bibles, MS, RD, LDN Inpatient Clinical Dietitian Contact information available via Amion

## 2020-03-02 NOTE — Progress Notes (Signed)
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  He completed 5 days of Remdesivir and was dosed with IV steroids.  At the time of his discharge he had been weaned back to his baseline 2 L of oxygen.  He was returned to the ER essentially because his wife was admitted with Covid and the patient is unable to care for himself at home.  Significant Events:  9/2 + Covid test 9/7 discharge home after inpatient stay for Covid 9/10 readmit to [facility] due to inability to care for himself at home  Date of Positive COVID Test:  02/19/20  Vaccination Status: [place] -last dose March 2021  COVID-19 specific Treatment: None presently  Antimicrobials:  None  DVT prophylaxis: Lovenox  Subjective: Afebrile.  Vital signs stable.  Oxygen saturation 99% on room air.  Resting comfortably at time of my visit.  Has no new complaints other than the fact that he is being brought 2% milk and not whole milk.  Assessment & Plan:  Generalized weakness - inability to care for himself at home Continue supportive care in hospital until safe disposition for discharge can be arranged  COVID Pneumonia Remains stable clinically from this standpoint - remains within 21-day isolation window -repeat testing remained positive 9/12  Chronic hypoxic respiratory failure due to COPD Continue usual home 2 L nasal cannula support - continue usual home inhaler therapies  Chronic diastolic  CHF Well compensated at present  Essential HTN Blood pressure stable presently  DM2 - diet controlled CBG well controlled  Neurogenic bladder with chronic indwelling Foley  Sacral pressure wound - present on admission Wound care as directed by [place] team  Lower extremity pressure wounds - present on admission Care as directed by [place] team  Hypothyroidism Continue home Synthroid dose  HLD Continue home atorvastatin  Spinal stenosis with chronic pain and spasticity Continue usual home medical therapy  OSA  Functional quadriplegia   Code Status: NO CODE BLUE -DNR Family Communication:  Status is: Observation  The patient remains OBS appropriate and will d/c before 2 midnights.  Dispo: The patient is from: Home              Anticipated d/c is to: unclear presently              Anticipated d/c date is: 3 days              Patient currently is medically stable to d/c.  Consultants:  none  Objective: Blood pressure 115/70, pulse (!) 58, temperature (!) 97.4 F (36.3 C), resp. rate 18, height 5\' 7"  (1.702 m), weight 90.7 kg, SpO2 99 %.  Intake/Output Summary (Last 24 hours) at 03/02/2020 0900 Last data filed at 03/02/2020 0825 Gross per 24 hour  Intake 240 ml  Output 1950 ml  Net -1710 ml   Filed Weights   02/27/20 1226 02/27/20 1409  Weight: 90.7 kg 90.7 kg    Examination: Lungs: mild faint scattered crackles  CV: RRR -no rub Abdom: soft, protuberant but no rebound,  BS+ Ext: trace edema B lower extremities  CBC: Recent Labs  Lab 02/27/20 1320 02/28/20 0421 02/29/20 0428  WBC 8.7 8.8 8.6  NEUTROABS 6.6  --   --   HGB 14.2 13.8 13.5  HCT 43.9 43.1 42.6  MCV 83.6 84.7 85.4  PLT 309 298 941   Basic Metabolic Panel: Recent Labs  Lab 02/27/20 1320 02/28/20 0421 02/29/20 0428  NA 137 140 140  K 4.4 3.4* 3.6  CL 95* 95* 96*  CO2 30 30 31   GLUCOSE 87 73 79  BUN 29* 29* 29*  CREATININE 0.79 0.73 0.88  CALCIUM 8.5* 8.7* 8.8*   GFR: Estimated  Creatinine Clearance: 74.3 mL/min (by C-G formula based on SCr of 0.88 mg/dL).  Liver Function Tests: No results for input(s): AST, ALT, ALKPHOS, BILITOT, PROT, ALBUMIN in the last 168 hours.  HbA1C: Hgb A1c MFr Bld  Date/Time Value Ref Range Status  02/19/2020 10:12 PM 5.6 4.8 - 5.6 % Final    Comment:    (NOTE) Pre diabetes:          5.7%-6.4%  Diabetes:              >6.4%  Glycemic control for   <7.0% adults with diabetes   09/17/2019 08:29 PM 5.8 (H) 4.8 - 5.6 % Final    Comment:    (NOTE) Pre diabetes:          5.7%-6.4% Diabetes:              >6.4% Glycemic control for   <7.0% adults with diabetes     CBG: Recent Labs  Lab 03/01/20 0746 03/01/20 1143 03/01/20 1638 03/01/20 2100 03/02/20 0742  GLUCAP 100* 102* 96 130* 98    Recent Results (from the past 240 hour(s))  SARS Coronavirus 2 by RT PCR (hospital order, performed in [place] hospital lab) Nasopharyngeal Nasopharyngeal Swab     Status: Abnormal   Collection Time: 03/01/20  1:32 PM   Specimen: Nasopharyngeal Swab  Result Value Ref Range Status   SARS Coronavirus 2 POSITIVE (A) NEGATIVE Final    Comment: RESULT CALLED TO, READ BACK BY AND VERIFIED WITH: PARRISH,K. RN @1600  03/01/20 BILLINGSLEY,L (NOTE) SARS-CoV-2 target nucleic acids are DETECTED  SARS-CoV-2 RNA is generally detectable in upper respiratory specimens  during the acute phase of infection.  Positive results are indicative  of the presence of the identified virus, but do not rule out bacterial infection or co-infection with other pathogens not detected by the test.  Clinical correlation with patient history and  other diagnostic information is necessary to determine patient infection status.  The expected result is negative.  Fact Sheet for Patients:   StrictlyIdeas.no   Fact Sheet for Healthcare Providers:   BankingDealers.co.za    This test is not yet approved or cleared by the  [place] FDA and  has been authorized for detection and/or diagnosis of SARS-CoV-2 by FDA under an Emergency Use Authorization (EUA).  This EUA will remain in effect (meaning  this test can be used) for the duration of  the COVID-19 declaration under Section 564(b)(1) of the Act, 21 U.S.C. section 360-bbb-3(b)(1), unless the authorization is terminated or revoked sooner.  Performed at [facility], [place] [address], [place], [place] [address]      Scheduled Meds: . ascorbic acid  500 mg Oral Daily  . aspirin EC  325 mg Oral Daily  . atorvastatin  20 mg Oral q1800  . baclofen  40 mg Oral  QID  . calcium citrate  200 mg of elemental calcium Oral Daily  . Chlorhexidine Gluconate Cloth  6 each Topical Daily  . cholecalciferol  1,000 Units Oral Daily  . colchicine  0.6 mg Oral Daily  . enoxaparin (LOVENOX) injection  40 mg Subcutaneous Q24H  . fluticasone furoate-vilanterol  1 puff Inhalation Daily   And  . umeclidinium bromide  1 puff Inhalation Daily  . furosemide  40 mg Oral Daily  . gabapentin  300 mg Oral QHS  . guaiFENesin  1,200 mg Oral BID  . insulin aspart  0-15 Units Subcutaneous TID WC  . latanoprost  1 drop Both Eyes QHS  . levothyroxine  175 mcg Oral QAC breakfast  . mouth rinse  15 mL Mouth Rinse BID  . metoprolol succinate  25 mg Oral Daily  . multivitamin with minerals  1 tablet Oral Daily  . pantoprazole  40 mg Oral QPM  . polyethylene glycol  17 g Oral BID  . potassium chloride  40 mEq Oral Daily  . senna-docusate  2 tablet Oral QHS  . zinc sulfate  220 mg Oral Daily      LOS: 0 days   Cherene Altes, MD [facility] Office  [phone] Pager - Text Page per Amion  If 7PM-7AM, please contact night-coverage per Amion 03/02/2020, 9:00 AM

## 2020-03-03 ENCOUNTER — Encounter (HOSPITAL_BASED_OUTPATIENT_CLINIC_OR_DEPARTMENT_OTHER): Payer: Medicare Other | Admitting: Physician Assistant

## 2020-03-03 DIAGNOSIS — U071 COVID-19: Secondary | ICD-10-CM

## 2020-03-03 DIAGNOSIS — Z978 Presence of other specified devices: Secondary | ICD-10-CM | POA: Diagnosis not present

## 2020-03-03 DIAGNOSIS — J9611 Chronic respiratory failure with hypoxia: Secondary | ICD-10-CM

## 2020-03-03 DIAGNOSIS — R531 Weakness: Secondary | ICD-10-CM | POA: Diagnosis not present

## 2020-03-03 DIAGNOSIS — I5032 Chronic diastolic (congestive) heart failure: Secondary | ICD-10-CM | POA: Diagnosis not present

## 2020-03-03 DIAGNOSIS — E039 Hypothyroidism, unspecified: Secondary | ICD-10-CM

## 2020-03-03 DIAGNOSIS — E118 Type 2 diabetes mellitus with unspecified complications: Secondary | ICD-10-CM

## 2020-03-03 DIAGNOSIS — J431 Panlobular emphysema: Secondary | ICD-10-CM

## 2020-03-03 DIAGNOSIS — R532 Functional quadriplegia: Secondary | ICD-10-CM

## 2020-03-03 DIAGNOSIS — J1282 Pneumonia due to coronavirus disease 2019: Secondary | ICD-10-CM

## 2020-03-03 LAB — GLUCOSE, CAPILLARY
Glucose-Capillary: 96 mg/dL (ref 70–99)
Glucose-Capillary: 94 mg/dL (ref 70–99)
Glucose-Capillary: 120 mg/dL — ABNORMAL HIGH (ref 70–99)
Glucose-Capillary: 118 mg/dL — ABNORMAL HIGH (ref 70–99)

## 2020-03-03 NOTE — TOC Progression Note (Signed)
Transition of Care [facility]) - Progression Note    Patient Details  Name: [name] MRN: [id number] Date of Birth: 1940/07/13  Transition of Care [facility]) CM/SW [place], [place] Phone Number: 03/03/2020, 9:17 AM  Clinical Narrative:   Bed not available until Friday at [facility].  Received insurance authorization yesterday for 3 days-9/14-9/16. Auth # [id number].  Cyril Mourning is reviewer FAX [phone]. TOC will continue to follow during the course of hospitalization.     Expected Discharge Plan: Skilled Nursing Facility Barriers to Discharge: SNF Pending bed offer  Expected Discharge Plan and Services Expected Discharge Plan: [place]   Discharge Planning Services: CM Consult   Living arrangements for the past 2 months: Single Family Home                 DME Arranged: N/A DME Agency: NA                   Social Determinants of Health (SDOH) Interventions    Readmission Risk Interventions No flowsheet data found.

## 2020-03-03 NOTE — Progress Notes (Signed)
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  He completed 5 days of Remdesivir and was dosed with IV steroids.  At the time of his discharge he had been weaned back to his baseline 2 L of oxygen.  He was returned to the ER essentially because his wife was admitted with Covid and the patient is unable to care for himself at home.   Subjective: Afebrile patient admitted because he could not take care of himself at home.patient stated his only reason he was admitted was so that he could be admitted to an SNF   Assessment & Plan: Covid vaccination; vaccinated   Principal Problem:   Generalized weakness Active Problems:   Diabetes mellitus type 2, controlled, with complications (HCC)   Chronic respiratory failure with hypoxia (HCC)   Chronic diastolic congestive heart failure (HCC)   Hypothyroidism   Chronic indwelling Foley catheter   COPD (chronic obstructive pulmonary disease) ([place])   Pneumonia due to COVID-19 virus   Functional quadriplegia (HCC)   Generalized weakness - inability to care for himself at home Continue supportive care in hospital until safe disposition for discharge can be arranged -We will speak with LCSW in the morning; where are we on discharging patient to SNF?  COVID Pneumonia Remains stable clinically from this standpoint - remains within 21-day isolation window -repeat testing remained positive 9/12 -Since patient was within  21-day window should never have had repeat testing.  As we know he would be positive.  Chronic hypoxic respiratory failure due to COPD Continue usual home 2 L nasal cannula support - continue usual home inhaler therapies  Chronic diastolic CHF Well compensated at present  Essential HTN Blood pressure stable presently  DM type II controlled without complication  - diet controlled at home -Patient requests that he be placed on regular diet, understands the risks of hyperglycemia given his other multiple medical problems eating is his only pleasure in life. -9/15 regular diet   CBG well controlled  Neurogenic bladder with chronic indwelling Foley  Sacral pressure wound - present on admission Wound care as directed by [place] team  Lower extremity pressure wounds - present on admission Care as directed by [place] team  Hypothyroidism Continue home Synthroid dose  HLD Continue home atorvastatin  Spinal stenosis with chronic pain and spasticity Continue usual home medical therapy  OSA  Functional quadriplegia   DVT prophylaxis: Lovenox Code Status: DNR Family Communication:  Status is: Inpatient    Dispo: The patient is from: Home              Anticipated d/c is to: SNF              Anticipated d/c date is:??              Patient currently stable      Consultants:    Procedures/Significant Events:    I have personally reviewed and interpreted all radiology studies and my findings are as above.  VENTILATOR SETTINGS:    Cultures  Antimicrobials:    Devices    LINES / TUBES:      Continuous Infusions:   Objective: Vitals:   03/02/20 0531 03/02/20 1500 03/02/20 2003 03/03/20 0429  BP: 115/70 96/67 108/81 122/62  Pulse: (!) 58 67 70 (!) 55  Resp: 18 18 18 19   Temp: (!) 97.4 F (36.3 C) 98.2 F (36.8 C) 98.9 F (37.2 C) 97.6 F (36.4 C)  TempSrc:   Oral   SpO2: 99% 97% 93% 94%  Weight:      Height:        Intake/Output  Summary (Last 24 hours) at 03/03/2020 0901 Last data filed at 03/03/2020 0511 Gross per 24 hour  Intake 480 ml  Output 1900 ml  Net -1420 ml   Filed Weights   02/27/20 1226 02/27/20 1409  Weight: 90.7 kg 90.7 kg    Examination:  General: No acute respiratory distress Eyes: negative scleral hemorrhage, negative anisocoria, negative icterus ENT: Negative Runny nose, negative gingival bleeding, Neck:  Negative scars, masses, torticollis, lymphadenopathy, JVD Lungs: Clear to auscultation bilaterally without wheezes or crackles Cardiovascular: Regular rate and rhythm without murmur gallop or rub normal S1 and S2 Abdomen: negative abdominal pain, nondistended, positive soft, bowel sounds, no rebound, no ascites, no appreciable mass Extremities: No significant cyanosis, clubbing, or edema bilateral lower extremities Skin: Negative rashes, lesions, ulcers Psychiatric:  Negative depression, negative anxiety, negative fatigue, negative mania  Central nervous system:  Cranial nerves II through XII intact, tongue/uvula midline, negative dysarthria, negative expressive aphasia, negative receptive aphasia.  .     Data Reviewed: Care during the described time interval was provided by me .  I have reviewed this patient's available data, including medical history, events of note, physical examination, and all test results as part of my evaluation.  CBC: Recent Labs  Lab 02/27/20 1320 02/28/20 0421 02/29/20 0428  WBC 8.7 8.8 8.6  NEUTROABS 6.6  --   --   HGB 14.2 13.8 13.5  HCT 43.9 43.1 42.6  MCV 83.6 84.7 85.4  PLT 309 298 628   Basic Metabolic Panel: Recent Labs  Lab 02/27/20 1320 02/28/20 0421 02/29/20 0428  NA 137 140 140  K 4.4 3.4* 3.6  CL 95* 95* 96*  CO2 30 30 31   GLUCOSE 87 73 79  BUN 29* 29* 29*  CREATININE 0.79 0.73 0.88  CALCIUM 8.5* 8.7* 8.8*   GFR: Estimated Creatinine Clearance: 74.3 mL/min (by C-G formula based on SCr of 0.88 mg/dL). Liver Function  Tests: No results for input(s): AST, ALT, ALKPHOS, BILITOT, PROT, ALBUMIN in the last 168 hours. No results for input(s): LIPASE, AMYLASE in the last 168 hours. No results for input(s): AMMONIA in the last 168 hours. Coagulation Profile: No results for input(s): INR, PROTIME in the last 168 hours. Cardiac Enzymes: No results for input(s): CKTOTAL, CKMB, CKMBINDEX, TROPONINI in the last 168 hours. BNP (last 3 results) No results for input(s): PROBNP in the last 8760 hours. HbA1C: No results for input(s): HGBA1C in the last 72 hours. CBG: Recent Labs  Lab 03/02/20 0742 03/02/20 1103 03/02/20 1620 03/02/20 2004 03/03/20 0739  GLUCAP 98 93 75 125* 96   Lipid Profile: No results for input(s): CHOL, HDL, LDLCALC, TRIG, CHOLHDL, LDLDIRECT in the last 72 hours. Thyroid Function Tests: No results for input(s): TSH, T4TOTAL, FREET4, T3FREE, THYROIDAB in the last 72 hours. Anemia Panel: No results for input(s): VITAMINB12, FOLATE, FERRITIN, TIBC, IRON, RETICCTPCT in the last 72 hours. Sepsis Labs: No results for input(s): PROCALCITON,  LATICACIDVEN in the last 168 hours.  Recent Results (from the past 240 hour(s))  SARS Coronavirus 2 by RT PCR (hospital order, performed in [facility] hospital lab) Nasopharyngeal Nasopharyngeal Swab     Status: Abnormal   Collection Time: 03/01/20  1:32 PM   Specimen: Nasopharyngeal Swab  Result Value Ref Range Status   SARS Coronavirus 2 POSITIVE (A) NEGATIVE Final    Comment: RESULT CALLED TO, READ BACK BY AND VERIFIED WITH: PARRISH,K. RN @1600  03/01/20 BILLINGSLEY,L (NOTE) SARS-CoV-2 target nucleic acids are DETECTED  SARS-CoV-2 RNA is generally detectable in upper respiratory specimens  during the acute phase of infection.  Positive results are indicative  of the presence of the identified virus, but do not rule out bacterial infection or co-infection with other pathogens not detected by the test.  Clinical correlation with patient history and   other diagnostic information is necessary to determine patient infection status.  The expected result is negative.  Fact Sheet for Patients:   StrictlyIdeas.no   Fact Sheet for Healthcare Providers:   BankingDealers.co.za    This test is not yet approved or cleared by the [place] FDA and  has been authorized for detection and/or diagnosis of SARS-CoV-2 by FDA under an Emergency Use Authorization (EUA).  This EUA will remain in effect (meaning  this test can be used) for the duration of  the COVID-19 declaration under Section 564(b)(1) of the Act, 21 U.S.C. section 360-bbb-3(b)(1), unless the authorization is terminated or revoked sooner.  Performed at [facility], [place] [address], [place], [place] [address]          Radiology Studies: No results found.      Scheduled Meds:  (feeding supplement) PROSource Plus  30 mL Oral BID BM   ascorbic acid  500 mg Oral Daily   aspirin EC  325 mg Oral Daily   atorvastatin  20 mg Oral q1800   baclofen  40 mg Oral QID   calcium citrate  200 mg of elemental calcium Oral Daily   Chlorhexidine Gluconate Cloth  6 each Topical Daily   cholecalciferol  1,000 Units Oral Daily   colchicine  0.6 mg Oral Daily   enoxaparin (LOVENOX) injection  40 mg Subcutaneous Q24H   feeding supplement (ENSURE ENLIVE)  237 mL Oral BID BM   fluticasone furoate-vilanterol  1 puff Inhalation Daily   And   umeclidinium bromide  1 puff Inhalation Daily   furosemide  40 mg Oral Daily   gabapentin  300 mg Oral QHS   guaiFENesin  1,200 mg Oral BID   insulin aspart  0-15 Units Subcutaneous TID WC   latanoprost  1 drop Both Eyes QHS   levothyroxine  175 mcg Oral QAC breakfast   mouth rinse  15 mL Mouth Rinse BID   metoprolol succinate  25 mg Oral Daily   multivitamin with minerals  1 tablet Oral Daily   pantoprazole  40 mg Oral QPM   polyethylene glycol  17 g Oral  BID   potassium chloride  40 mEq Oral Daily   senna-docusate  2 tablet Oral QHS   zinc sulfate  220 mg Oral Daily   Continuous Infusions:   LOS: 0 days    Time spent:40 min    Amadu Schlageter, Geraldo Docker, MD [facility] Pager [phone]  If 7PM-7AM, please contact night-coverage [email or web address] Password [email or web address] 03/03/2020, 9:01 AM

## 2020-03-04 LAB — COMPREHENSIVE METABOLIC PANEL
ALT: 14 U/L (ref 0–44)
AST: 16 U/L (ref 15–41)
Albumin: 3.4 g/dL — ABNORMAL LOW (ref 3.5–5.0)
Alkaline Phosphatase: 77 U/L (ref 38–126)
Anion gap: 10 (ref 5–15)
BUN: 18 mg/dL (ref 8–23)
CO2: 27 mmol/L (ref 22–32)
Calcium: 8.4 mg/dL — ABNORMAL LOW (ref 8.9–10.3)
Chloride: 103 mmol/L (ref 98–111)
Creatinine, Ser: 0.77 mg/dL (ref 0.61–1.24)
GFR calc Af Amer: >60 mL/min (ref >60)
GFR calc non Af Amer: >60 mL/min (ref >60)
Glucose, Bld: 101 mg/dL — ABNORMAL HIGH (ref 70–99)
Potassium: 3.8 mmol/L (ref 3.5–5.1)
Sodium: 140 mmol/L (ref 135–145)
Total Bilirubin: 0.4 mg/dL (ref 0.3–1.2)
Total Protein: 6.6 g/dL (ref 6.5–8.1)

## 2020-03-04 LAB — CBC WITH DIFFERENTIAL/PLATELET
Abs Immature Granulocytes: 0.04 10*3/uL (ref 0.00–0.07)
Basophils Absolute: 0 10*3/uL (ref 0.0–0.1)
Basophils Relative: 0 %
Eosinophils Absolute: 0.1 10*3/uL (ref 0.0–0.5)
Eosinophils Relative: 2 %
HCT: 42.2 % (ref 39.0–52.0)
Hemoglobin: 13.2 g/dL (ref 13.0–17.0)
Immature Granulocytes: 1 %
Lymphocytes Relative: 29 %
Lymphs Abs: 2.2 10*3/uL (ref 0.7–4.0)
MCH: 27.1 pg (ref 26.0–34.0)
MCHC: 31.3 g/dL (ref 30.0–36.0)
MCV: 86.7 fL (ref 80.0–100.0)
Monocytes Absolute: 0.7 10*3/uL (ref 0.1–1.0)
Monocytes Relative: 9 %
Neutro Abs: 4.6 10*3/uL (ref 1.7–7.7)
Neutrophils Relative %: 59 %
Platelets: 301 10*3/uL (ref 150–400)
RBC: 4.87 MIL/uL (ref 4.22–5.81)
RDW: 15.5 % (ref 11.5–15.5)
WBC: 7.6 10*3/uL (ref 4.0–10.5)
nRBC: 0 % (ref 0.0–0.2)

## 2020-03-04 LAB — GLUCOSE, CAPILLARY
Glucose-Capillary: 92 mg/dL (ref 70–99)
Glucose-Capillary: 113 mg/dL — ABNORMAL HIGH (ref 70–99)
Glucose-Capillary: 102 mg/dL — ABNORMAL HIGH (ref 70–99)
Glucose-Capillary: 142 mg/dL — ABNORMAL HIGH (ref 70–99)

## 2020-03-04 LAB — MAGNESIUM: Magnesium: 2.4 mg/dL (ref 1.7–2.4)

## 2020-03-04 LAB — PHOSPHORUS: Phosphorus: 3 mg/dL (ref 2.5–4.6)

## 2020-03-04 NOTE — Progress Notes (Addendum)
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  He completed 5 days of Remdesivir and was dosed with IV steroids.  At the time of his discharge he had been weaned back to his baseline 2 L of oxygen.  He was returned to the ER essentially because his wife was admitted with Covid and the patient is unable to care for himself at home.   Subjective: 9/16 A/O x4 should discharge in the a.m.    Assessment & Plan: Covid vaccination; vaccinated   Principal Problem:   Generalized weakness Active Problems:   Diabetes mellitus type 2, controlled, with complications (HCC)   Chronic respiratory failure with hypoxia (HCC)   Chronic diastolic congestive heart failure (HCC)   Hypothyroidism   Chronic indwelling Foley catheter   COPD (chronic obstructive pulmonary disease) ([place])   Pneumonia due to COVID-19 virus   Functional quadriplegia (HCC)   Generalized weakness - inability to care for himself at home Continue supportive care in hospital until safe disposition for discharge can be arranged -We will speak with LCSW in the morning; where are we on discharging patient to SNF?  COVID Pneumonia Remains stable clinically from this standpoint - remains within 21-day isolation window -repeat testing remained positive 9/12 -Since patient was within 21-day window should never have had repeat testing.  As we know he would be positive.  Chronic hypoxic respiratory failure  due to COPD -Patient currently on room air  Chronic diastolic CHF -Strict in and out -Daily weight -Lasix 40 mg daily -Toprol 25 mg daily  Essential HTN -See CHF  DM type II controlled without complication  - diet controlled at home -Patient requests that he be placed on regular diet, understands the risks of hyperglycemia given his other multiple medical problems eating is his only pleasure in life. -9/15 regular diet   Neurogenic bladder  -chronic indwelling Foley  Sacral pressure wound/ Lower extremity pressure wounds present on admission -Wound care as directed by [place] team  Pressure Injury 09/22/19 Ankle Left;Lateral Deep Tissue Pressure Injury - Purple or maroon localized area of discolored intact skin or blood-filled blister due to damage of underlying soft tissue from pressure and/or shear. (Active)  09/22/19 0503  Location: Ankle  Location Orientation: Left;Lateral  Staging: Deep Tissue Pressure Injury - Purple or maroon localized area of discolored intact skin or blood-filled blister due to damage of underlying soft tissue from pressure and/or shear.  Wound Description (Comments):   Present on Admission: Yes     Pressure Injury 10/15/19 Coccyx Left Unstageable - Full thickness tissue loss in which the base of the injury is covered by slough (yellow, tan, gray, green or brown) and/or eschar (tan, brown or black) in the wound bed. (Active)  10/15/19 2231  Location: Coccyx  Location Orientation: Left  Staging: Unstageable - Full thickness tissue loss in which the base of the injury is covered by slough (yellow, tan, gray, green or brown) and/or eschar (tan, brown or black) in the wound bed.  Wound Description (Comments):  Present on Admission: Yes   Hypothyroidism -Levothyroxine on 175 mcg daily  HLD Continue home atorvastatin  Spinal stenosis with chronic pain and spasticity Continue usual home medical therapy  OSA  Functional quadriplegia  anemia  DVT prophylaxis: Lovenox Code Status: DNR Family Communication:  Status is: Inpatient    Dispo: The patient is from: Home              Anticipated d/c is to: SNF              Anticipated d/c date is:??              Patient currently stable      Consultants:    Procedures/Significant Events:    I have personally reviewed and interpreted all radiology studies and my findings are as above.  VENTILATOR SETTINGS:    Cultures   Antimicrobials:    Devices    LINES / TUBES:      Continuous Infusions:   Objective: Vitals:   03/03/20 2311 03/04/20 0524 03/04/20 0752 03/04/20 1341  BP: 117/74 125/70  137/78  Pulse: (!) 57 (!) 54  (!) 54  Resp: 17 19  17   Temp: 97.8 F (36.6 C) 98.8 F (37.1 C)  97.8 F (36.6 C)  TempSrc:    Oral  SpO2: 97% 95% 95% 96%  Weight:      Height:        Intake/Output Summary (Last 24 hours) at 03/04/2020 1522 Last data filed at 03/04/2020 1224 Gross per 24 hour  Intake 694 ml  Output 3350 ml  Net -2656 ml   Filed Weights   02/27/20 1226 02/27/20 1409  Weight: 90.7 kg 90.7 kg    Examination:  General: No acute respiratory distress Eyes: negative scleral hemorrhage, negative anisocoria, negative icterus ENT: Negative Runny nose, negative gingival bleeding, Neck:  Negative scars, masses, torticollis, lymphadenopathy, JVD Lungs: Clear to auscultation bilaterally without wheezes or crackles Cardiovascular: Regular rate and rhythm without murmur gallop or rub normal S1 and S2 Abdomen: negative abdominal pain, nondistended, positive soft, bowel sounds, no rebound, no ascites, no appreciable mass Extremities: No significant cyanosis, clubbing, or edema bilateral lower extremities Skin: Negative rashes, lesions, ulcers Psychiatric:  Negative depression, negative anxiety, negative fatigue, negative mania  Central nervous system:  Cranial nerves II through XII intact, tongue/uvula midline, negative dysarthria, negative  expressive aphasia, negative receptive aphasia.  .     Data Reviewed: Care during the described time interval was provided by me .  I have reviewed this patient's available data, including medical history, events of note, physical examination, and all test results as part of my evaluation.  CBC: Recent Labs  Lab 02/27/20 1320 02/28/20 0421 02/29/20 0428 03/04/20 0403  WBC 8.7 8.8 8.6 7.6  NEUTROABS 6.6  --   --  4.6  HGB 14.2 13.8 13.5 13.2  HCT 43.9 43.1 42.6 42.2  MCV 83.6 84.7 85.4 86.7  PLT 309 298 269 333   Basic Metabolic Panel: Recent Labs  Lab 02/27/20 1320 02/28/20 0421 02/29/20 0428 03/04/20 0403  NA 137 140 140 140  K 4.4 3.4* 3.6 3.8  CL 95* 95* 96* 103  CO2 30 30 31 27   GLUCOSE 87 73 79 101*  BUN 29* 29* 29* 18  CREATININE 0.79 0.73 0.88 0.77  CALCIUM 8.5* 8.7* 8.8* 8.4*  MG  --   --   --  2.4  PHOS  --   --   --  3.0   GFR: Estimated Creatinine  Clearance: 81.7 mL/min (by C-G formula based on SCr of 0.77 mg/dL). Liver Function Tests: Recent Labs  Lab 03/04/20 0403  AST 16  ALT 14  ALKPHOS 77  BILITOT 0.4  PROT 6.6  ALBUMIN 3.4*   No results for input(s): LIPASE, AMYLASE in the last 168 hours. No results for input(s): AMMONIA in the last 168 hours. Coagulation Profile: No results for input(s): INR, PROTIME in the last 168 hours. Cardiac Enzymes: No results for input(s): CKTOTAL, CKMB, CKMBINDEX, TROPONINI in the last 168 hours. BNP (last 3 results) No results for input(s): PROBNP in the last 8760 hours. HbA1C: No results for input(s): HGBA1C in the last 72 hours. CBG: Recent Labs  Lab 03/03/20 1138 03/03/20 1627 03/03/20 2054 03/04/20 0803 03/04/20 1145  GLUCAP 94 120* 118* 92 113*   Lipid Profile: No results for input(s): CHOL, HDL, LDLCALC, TRIG, CHOLHDL, LDLDIRECT in the last 72 hours. Thyroid Function Tests: No results for input(s): TSH, T4TOTAL, FREET4, T3FREE, THYROIDAB in the last 72 hours. Anemia Panel: No results for  input(s): VITAMINB12, FOLATE, FERRITIN, TIBC, IRON, RETICCTPCT in the last 72 hours. Sepsis Labs: No results for input(s): PROCALCITON, LATICACIDVEN in the last 168 hours.  Recent Results (from the past 240 hour(s))  SARS Coronavirus 2 by RT PCR (hospital order, performed in [facility] hospital lab) Nasopharyngeal Nasopharyngeal Swab     Status: Abnormal   Collection Time: 03/01/20  1:32 PM   Specimen: Nasopharyngeal Swab  Result Value Ref Range Status   SARS Coronavirus 2 POSITIVE (A) NEGATIVE Final    Comment: RESULT CALLED TO, READ BACK BY AND VERIFIED WITH: PARRISH,K. RN @1600  03/01/20 BILLINGSLEY,L (NOTE) SARS-CoV-2 target nucleic acids are DETECTED  SARS-CoV-2 RNA is generally detectable in upper respiratory specimens  during the acute phase of infection.  Positive results are indicative  of the presence of the identified virus, but do not rule out bacterial infection or co-infection with other pathogens not detected by the test.  Clinical correlation with patient history and  other diagnostic information is necessary to determine patient infection status.  The expected result is negative.  Fact Sheet for Patients:   StrictlyIdeas.no   Fact Sheet for Healthcare Providers:   BankingDealers.co.za    This test is not yet approved or cleared by the [place] FDA and  has been authorized for detection and/or diagnosis of SARS-CoV-2 by FDA under an Emergency Use Authorization (EUA).  This EUA will remain in effect (meaning  this test can be used) for the duration of  the COVID-19 declaration under Section 564(b)(1) of the Act, 21 U.S.C. section 360-bbb-3(b)(1), unless the authorization is terminated or revoked sooner.  Performed at [facility], [place] [address], [place], [place] [address]          Radiology Studies: No results found.      Scheduled Meds: . (feeding supplement) PROSource Plus  30  mL Oral BID BM  . ascorbic acid  500 mg Oral Daily  . aspirin EC  325 mg Oral Daily  . atorvastatin  20 mg Oral q1800  . baclofen  40 mg Oral QID  . calcium citrate  200 mg of elemental calcium Oral Daily  . Chlorhexidine Gluconate Cloth  6 each Topical Daily  . cholecalciferol  1,000 Units Oral Daily  . colchicine  0.6 mg Oral Daily  . enoxaparin (LOVENOX) injection  40 mg Subcutaneous Q24H  . feeding supplement (ENSURE ENLIVE)  237 mL Oral BID BM  . fluticasone furoate-vilanterol  1  puff Inhalation Daily   And  . umeclidinium bromide  1 puff Inhalation Daily  . furosemide  40 mg Oral Daily  . gabapentin  300 mg Oral QHS  . guaiFENesin  1,200 mg Oral BID  . insulin aspart  0-15 Units Subcutaneous TID WC  . latanoprost  1 drop Both Eyes QHS  . levothyroxine  175 mcg Oral QAC breakfast  . mouth rinse  15 mL Mouth Rinse BID  . metoprolol succinate  25 mg Oral Daily  . multivitamin with minerals  1 tablet Oral Daily  . pantoprazole  40 mg Oral QPM  . polyethylene glycol  17 g Oral BID  . potassium chloride  40 mEq Oral Daily  . senna-docusate  2 tablet Oral QHS  . zinc sulfate  220 mg Oral Daily   Continuous Infusions:   LOS: 0 days    Time spent:15 min    Shanie Mauzy, Geraldo Docker, MD [facility] Pager [phone]  If 7PM-7AM, please contact night-coverage [email or web address] Password [facility] 03/04/2020, 3:22 PM

## 2020-03-04 NOTE — Progress Notes (Signed)
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  He completed 5 days of Remdesivir and was dosed with IV steroids.  At the time of his discharge he had been weaned back to his baseline 2 L of oxygen.  He was returned to the ER essentially because his wife was admitted with Covid and the patient is unable to care for himself at home.    PT Comments    Pt in bed on RA avg 95%.  General bed mobility comments: required MAX encouragement as pt fixaded on his "bed sore" and dis agreeable at first to reposition.  Side to side rolling Max Assist for hygiene (small BM) and pad change.  Assisted with sitting EOB required Total Assist.  Very rigid throughout.  Poor sitting balance with flex hips and knees as well as rounded shoulders and a collapsed spine.  Pt unable to support self sitting.  Poor trunk control as well as cervical forward flexion.  Increased c/o sacral pain.  Inability to properly place B feet on floor present with B knee flex contractures.  Pt was only able to static sit EOB 3 min before c/o dizziness.  Assisted back to supine required Total Assist to scoot to [facility] and position to comfort. Extended time in room as RN was also in room administering meds.   Follow Up Recommendations  SNF     Equipment Recommendations  None recommended by PT    Recommendations for Other Services       Precautions / Restrictions  Precautions Precautions: Fall Precaution Comments: sacral ulcer    Mobility  Bed Mobility Overal bed mobility: Needs Assistance Bed Mobility: Rolling;Sidelying to Sit Rolling: Max assist;+2 for physical assistance;+2 for safety/equipment Sidelying to sit: Total assist;+2 for physical assistance;+2 for safety/equipment       General bed mobility comments: required MAX encouragement as pt fixaded on his "bed sore" and dis agreeable at first to reposition.  Side to side rolling Max Assist for hygiene (small BM) and pad change.  Assisted with sitting EOB required Total Assist.  Very rigid throughout.  Poor sitting balance with flex hips and knees as well as rounded shoulders and a collapsed spine.  Pt unable to support self sitting.  Poor trunk control as well as cervical forward flexion.  Increased c/o sacral pain.  Inability to properly place B feet on floor present with B knee flex contractures.  Pt was only able to static sit EOB 3 min before c/o dizziness.  Assisted back to supine required Total Assist to scoot to [facility] and position to comfort.  Transfers                 General transfer comment: Pt uses a LIFT at home  Ambulation/Gait                 Stairs             Wheelchair Mobility    Modified Rankin (Stroke Patients Only)  Balance                                            Cognition Arousal/Alertness: Awake/alert Behavior During Therapy: WFL for tasks assessed/performed Overall Cognitive Status: Within Functional Limits for tasks assessed                                 General Comments: AxO x 3      Exercises      General Comments        Pertinent Vitals/Pain Pain Assessment: Faces Faces Pain Scale: Hurts a little bit Pain Location: L hip "my bed sore" Pain Descriptors / Indicators: Discomfort;Sore;Grimacing Pain Intervention(s): Monitored during session;Repositioned    Home Living                       Prior Function            PT Goals (current goals can now be found in the care plan section)      Frequency    Min 2X/week      PT Plan Current plan remains appropriate    Co-evaluation              AM-PAC PT "6 Clicks" Mobility   Outcome Measure  Help needed turning from your back to your side while in a flat bed without using bedrails?: Total Help needed moving from lying on your back to sitting on the side of a flat bed without using bedrails?: Total Help needed moving to and from a bed to a chair (including a wheelchair)?: Total Help needed standing up from a chair using your arms (e.g., wheelchair or bedside chair)?: Total Help needed to walk in hospital room?: Total Help needed climbing 3-5 steps with a railing? : Total 6 Click Score: 6    End of Session Equipment Utilized During Treatment: Gait belt Activity Tolerance: Patient limited by pain;Treatment limited secondary to medical complications (Comment) Patient left: in bed;with call bell/phone within reach;with bed alarm set Nurse Communication: Mobility status;Need for lift equipment PT Visit Diagnosis: Other abnormalities of gait and mobility (R26.89);Muscle weakness (generalized) (M62.81)     Time: 6761-9509 PT Time Calculation (min) (ACUTE ONLY): 42 min  Charges:  $Therapeutic Activity: 38-52 mins                     Rica Koyanagi  PTA Acute  Rehabilitation Services Pager      [phone] Office      [phone]

## 2020-03-05 DIAGNOSIS — U071 COVID-19: Secondary | ICD-10-CM | POA: Diagnosis not present

## 2020-03-05 DIAGNOSIS — J9611 Chronic respiratory failure with hypoxia: Secondary | ICD-10-CM | POA: Diagnosis not present

## 2020-03-05 DIAGNOSIS — R531 Weakness: Secondary | ICD-10-CM | POA: Diagnosis not present

## 2020-03-05 DIAGNOSIS — J431 Panlobular emphysema: Secondary | ICD-10-CM | POA: Diagnosis not present

## 2020-03-05 LAB — GLUCOSE, CAPILLARY
Glucose-Capillary: 98 mg/dL (ref 70–99)
Glucose-Capillary: 89 mg/dL (ref 70–99)
Glucose-Capillary: 89 mg/dL (ref 70–99)

## 2020-03-05 LAB — CBC WITH DIFFERENTIAL/PLATELET
Abs Immature Granulocytes: 0.05 10*3/uL (ref 0.00–0.07)
Basophils Absolute: 0 10*3/uL (ref 0.0–0.1)
Basophils Relative: 0 %
Eosinophils Absolute: 0.1 10*3/uL (ref 0.0–0.5)
Eosinophils Relative: 1 %
HCT: 41.6 % (ref 39.0–52.0)
Hemoglobin: 13.3 g/dL (ref 13.0–17.0)
Immature Granulocytes: 1 %
Lymphocytes Relative: 18 %
Lymphs Abs: 1.8 10*3/uL (ref 0.7–4.0)
MCH: 27.4 pg (ref 26.0–34.0)
MCHC: 32 g/dL (ref 30.0–36.0)
MCV: 85.6 fL (ref 80.0–100.0)
Monocytes Absolute: 0.9 10*3/uL (ref 0.1–1.0)
Monocytes Relative: 9 %
Neutro Abs: 7.1 10*3/uL (ref 1.7–7.7)
Neutrophils Relative %: 71 %
Platelets: 297 10*3/uL (ref 150–400)
RBC: 4.86 MIL/uL (ref 4.22–5.81)
RDW: 15.6 % — ABNORMAL HIGH (ref 11.5–15.5)
WBC: 10 10*3/uL (ref 4.0–10.5)
nRBC: 0 % (ref 0.0–0.2)

## 2020-03-05 LAB — COMPREHENSIVE METABOLIC PANEL
ALT: 16 U/L (ref 0–44)
AST: 15 U/L (ref 15–41)
Albumin: 3.5 g/dL (ref 3.5–5.0)
Alkaline Phosphatase: 76 U/L (ref 38–126)
Anion gap: 9 (ref 5–15)
BUN: 22 mg/dL (ref 8–23)
CO2: 29 mmol/L (ref 22–32)
Calcium: 8.8 mg/dL — ABNORMAL LOW (ref 8.9–10.3)
Chloride: 103 mmol/L (ref 98–111)
Creatinine, Ser: 0.71 mg/dL (ref 0.61–1.24)
GFR calc Af Amer: >60 mL/min (ref >60)
GFR calc non Af Amer: >60 mL/min (ref >60)
Glucose, Bld: 99 mg/dL (ref 70–99)
Potassium: 4.1 mmol/L (ref 3.5–5.1)
Sodium: 141 mmol/L (ref 135–145)
Total Bilirubin: 0.6 mg/dL (ref 0.3–1.2)
Total Protein: 6.9 g/dL (ref 6.5–8.1)

## 2020-03-05 LAB — PHOSPHORUS: Phosphorus: 3.4 mg/dL (ref 2.5–4.6)

## 2020-03-05 LAB — MAGNESIUM: Magnesium: 2.6 mg/dL — ABNORMAL HIGH (ref 1.7–2.4)

## 2020-03-05 MED ORDER — DOCUSATE SODIUM 100 MG PO CAPS: 100.0000 mg | ORAL_CAPSULE | Freq: Two times a day (BID) | ORAL | 0 refills | Status: DC | PRN

## 2020-03-05 MED ORDER — ONDANSETRON HCL 4 MG PO TABS: 4.0000 mg | ORAL_TABLET | Freq: Four times a day (QID) | ORAL | 0 refills | Status: DC | PRN

## 2020-03-05 NOTE — Progress Notes (Signed)
Report given to [facility]. All questions were answered. Patient's personal belongings packed including cpap and portable wound vac.

## 2020-03-05 NOTE — TOC Progression Note (Signed)
Transition of Care [facility]) - Progression Note    Patient Details  Name: [name] MRN: [id number] Date of Birth: 03-07-1941  Transition of Care [facility]) CM/SW [place], [place] Phone Number: 03/05/2020, 11:26 AM  Clinical Narrative:    [name] does not have a bed available today and will not have a bed until later next week. The facility is coordinating a bed at [facility], if possible. CSW updated the  patient daughter Roselyn Reef. She is agreeable to send the patient to another SNF.   TOC staff will continue to follow this patient.   Expected Discharge Plan: Skilled Nursing Facility Barriers to Discharge: SNF Pending bed offer  Expected Discharge Plan and Services Expected Discharge Plan: [place]   Discharge Planning Services: CM Consult   Living arrangements for the past 2 months: Single Family Home                 DME Arranged: N/A DME Agency: NA                   Social Determinants of Health (SDOH) Interventions    Readmission Risk Interventions No flowsheet data found.

## 2020-03-05 NOTE — Discharge Summary (Addendum)
Physician Discharge Summary  [name] ZWC:[id number] DOB: 1940-10-05 DOA: 02/27/2020  PCP: Cesar Battles, MD  Admit date: 02/27/2020 Discharge date: 03/05/2020  Time spent: 35 minutes  Recommendations for Outpatient Follow-up:   Covid vaccination; vaccinated  Generalized weakness - inability to care for himself at home Continue supportive care in hospitaluntil safe disposition for discharge can be arranged -We will speak with LCSW in the morning; where are we on discharging patient to SNF?  COVID Pneumonia COVID-19 Labs  No results for input(s): DDIMER, FERRITIN, LDH, CRP in the last 72 hours.  Lab Results  Component Value Date   [place] (A) 03/01/2020   SARSCOV2NAA POSITIVE (A) 02/19/2020   [place] NEGATIVE 09/17/2019   [place] NEGATIVE 01/24/2019  --Remains stable clinically from this standpoint - remains within 21-day isolation window-repeat testing remained positive 9/12 -Since patient was within 21-day window should never have had repeat testing.  As we know he would be positive. 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 -9/15 regular diet   Neurogenic bladder  -chronic indwelling Foley  Sacral pressure wound/ Lower extremity pressure wounds present on admission -Wound care as directed by [name]  Pressure Injury 09/22/19 Ankle Left;Lateral Deep Tissue Pressure Injury - Purple or maroon localized area of discolored intact skin or blood-filled blister  due to damage of underlying soft tissue from pressure and/or shear. (Active)  09/22/19 0503  Location: Ankle  Location Orientation: Left;Lateral  Staging: Deep Tissue Pressure Injury - Purple or maroon localized area of discolored intact skin or blood-filled blister due to damage of underlying soft tissue from pressure and/or shear.  Wound Description (Comments):   Present on Admission: Yes     Pressure Injury 10/15/19 Coccyx Left Unstageable - Full thickness tissue loss in which the base of the injury is covered by slough (yellow, tan, gray, green or brown) and/or eschar (tan, brown or black) in the wound bed. (Active)  10/15/19 2231  Location: Coccyx  Location Orientation: Left  Staging: Unstageable - Full thickness tissue loss in which the base of the injury is covered by slough (yellow, tan, gray, green or brown) and/or eschar (tan, brown or black) in the wound bed.  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 --Continue usual home medical therapy  OSA -CPAP   Functional quadriplegia anemia      Discharge Diagnoses:  Principal Problem:   Generalized weakness Active Problems:   Diabetes mellitus type 2, controlled, with complications (HCC)   Chronic respiratory failure with hypoxia (HCC)   Chronic diastolic congestive heart failure (HCC)   Hypothyroidism   Chronic indwelling Foley catheter   COPD (chronic obstructive pulmonary disease) ([place])   Pneumonia due to COVID-19 virus   Functional quadriplegia ([place])   Discharge Condition: stable  Diet recommendation: Regular  Filed Weights   02/27/20 1226 02/27/20 1409  Weight: 90.7 kg 90.7 kg    History of present  illness:  79 year old WM PMHx chronic hypoxic respiratory failure due to COPD requiring 2 L nasal cannula at baseline, chronic diastolic CHF, DM2, HTN, HLD,  hypothyroidism, ascending thoracic aortic aneurysm, status post CEA, neurogenic bladder with chronic indwelling Foley catheter, spinal stenosis with chronic pain, 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 He completed 5 days of Remdesivir and was dosed with IV steroids. At the time of his discharge he had been weaned back to his baseline 2 L of oxygen. He was returned to the ER essentially because his wife was admitted with Covid and the patient is unable to care for himself at home.  Hospital Course:  See above   Cultures  9/13 SARS Coronavirus positive  Antibiotics Anti-infectives (From admission, onward)   None       Discharge Exam: Vitals:   03/04/20 0752 03/04/20 1341 03/04/20 2003 03/05/20 0405  BP:  137/78 103/68 135/83  Pulse:  (!) 54 81 67  Resp:  17 20 20   Temp:  97.8 F (36.6 C) 98.4 F (36.9 C) 97.6 F (36.4 C)  TempSrc:  Oral    SpO2: 95% 96% (!) 86% 95%  Weight:      Height:        General: No acute respiratory distress Eyes: negative scleral hemorrhage, negative anisocoria, negative icterus ENT: Negative Runny nose, negative gingival bleeding, Neck:  Negative scars, masses, torticollis, lymphadenopathy, JVD Lungs: Clear to auscultation bilaterally without wheezes or crackles Cardiovascular: Regular rate and rhythm without murmur gallop or rub normal S1 and S2  Discharge Instructions  Discharge Instructions    Change dressing (specify)   Complete by: As directed    See enclosed instructions   Diet - low sodium heart healthy   Complete by: As directed    Discharge instructions   Complete by: As directed    ?   Person Under Monitoring Name: [name]  Location: [place] [id number]   Infection Prevention Recommendations for Individuals Confirmed to have, or Being Evaluated for, 2019 Novel Coronavirus (COVID-19)  Infection Who Receive Care at Home  Individuals who are confirmed to have, or are being evaluated for, COVID-19 should follow the prevention steps below until a healthcare provider or local or state health department says they can return to normal activities.  Stay home except to get medical care You should restrict activities outside your home, except for getting medical care. Do not go to work, school, or public areas, and do not use public transportation or taxis.  Call ahead before visiting your doctor Before your medical appointment, call the healthcare provider and tell them that you have, or are being evaluated for, COVID-19 infection. This will help the healthcare provider's office take steps to keep other people from getting infected. Ask your healthcare provider to call the local or state health department.  Monitor your symptoms Seek prompt medical attention if your illness is worsening (e.g., difficulty breathing). Before going to your medical appointment, call the healthcare provider and tell them that you have, or are being evaluated for, COVID-19 infection. Ask your healthcare provider to call the local or state health department.  Wear a facemask You should wear a facemask that covers your nose and mouth when you are in the same room with other people and when you visit a healthcare provider. People who live with or visit you should also wear a  facemask while they are in the same room with you.  Separate yourself from other people in your home As much as possible, you should stay in a different room from other people in your home. Also, you should use a separate bathroom, if available.  Avoid sharing household items You should not share dishes, drinking glasses, cups, eating utensils, towels, bedding, or other items with other people in your home. After using these items, you should wash them thoroughly with soap and water.  Cover your coughs and sneezes Cover your  mouth and nose with a tissue when you cough or sneeze, or you can cough or sneeze into your sleeve. Throw used tissues in a lined trash can, and immediately wash your hands with soap and water for at least 20 seconds or use an alcohol-based hand rub.  Wash your [facility] your hands often and thoroughly with soap and water for at least 20 seconds. You can use an alcohol-based hand sanitizer if soap and water are not available and if your hands are not visibly dirty. Avoid touching your eyes, nose, and mouth with unwashed hands.   Prevention Steps for Caregivers and Household Members of Individuals Confirmed to have, or Being Evaluated for, COVID-19 Infection Being Cared for in the Home  If you live with, or provide care at home for, a person confirmed to have, or being evaluated for, COVID-19 infection please follow these guidelines to prevent infection:  Follow healthcare provider's instructions Make sure that you understand and can help the patient follow any healthcare provider instructions for all care.  Provide for the patient's basic needs You should help the patient with basic needs in the home and provide support for getting groceries, prescriptions, and other personal needs.  Monitor the patient's symptoms If they are getting sicker, call his or her medical provider and tell them that the patient has, or is being evaluated for, COVID-19 infection. This will help the healthcare provider's office take steps to keep other people from getting infected. Ask the healthcare provider to call the local or state health department.  Limit the number of people who have contact with the patient If possible, have only one caregiver for the patient. Other household members should stay in another home or place of residence. If this is not possible, they should stay in another room, or be separated from the patient as much as possible. Use a separate bathroom, if available. Restrict visitors  who do not have an essential need to be in the home.  Keep older adults, very young children, and other sick people away from the patient Keep older adults, very young children, and those who have compromised immune systems or chronic health conditions away from the patient. This includes people with chronic heart, lung, or kidney conditions, diabetes, and cancer.  Ensure good ventilation Make sure that shared spaces in the home have good air flow, such as from an air conditioner or an opened window, weather permitting.  Wash your hands often Wash your hands often and thoroughly with soap and water for at least 20 seconds. You can use an alcohol based hand sanitizer if soap and water are not available and if your hands are not visibly dirty. Avoid touching your eyes, nose, and mouth with unwashed hands. Use disposable paper towels to dry your hands. If not available, use dedicated cloth towels and replace them when they become wet.  Wear a facemask and gloves Wear a disposable facemask at all times in the room  and gloves when you touch or have contact with the patient's blood, body fluids, and/or secretions or excretions, such as sweat, saliva, sputum, nasal mucus, vomit, urine, or feces.  Ensure the mask fits over your nose and mouth tightly, and do not touch it during use. Throw out disposable facemasks and gloves after using them. Do not reuse. Wash your hands immediately after removing your facemask and gloves. If your personal clothing becomes contaminated, carefully remove clothing and launder. Wash your hands after handling contaminated clothing. Place all used disposable facemasks, gloves, and other waste in a lined container before disposing them with other household waste. Remove gloves and wash your hands immediately after handling these items.  Do not share dishes, glasses, or other household items with the patient Avoid sharing household items. You should not share dishes,  drinking glasses, cups, eating utensils, towels, bedding, or other items with a patient who is confirmed to have, or being evaluated for, COVID-19 infection. After the person uses these items, you should wash them thoroughly with soap and water.  Wash laundry thoroughly Immediately remove and wash clothes or bedding that have blood, body fluids, and/or secretions or excretions, such as sweat, saliva, sputum, nasal mucus, vomit, urine, or feces, on them. Wear gloves when handling laundry from the patient. Read and follow directions on labels of laundry or clothing items and detergent. In general, wash and dry with the warmest temperatures recommended on the label.  Clean all areas the individual has used often Clean all touchable surfaces, such as counters, tabletops, doorknobs, bathroom fixtures, toilets, phones, keyboards, tablets, and bedside tables, every day. Also, clean any surfaces that may have blood, body fluids, and/or secretions or excretions on them. Wear gloves when cleaning surfaces the patient has come in contact with. Use a diluted bleach solution (e.g., dilute bleach with 1 part bleach and 10 parts water) or a household disinfectant with a label that says EPA-registered for coronaviruses. To make a bleach solution at home, add 1 tablespoon of bleach to 1 quart (4 cups) of water. For a larger supply, add  cup of bleach to 1 gallon (16 cups) of water. Read labels of cleaning products and follow recommendations provided on product labels. Labels contain instructions for safe and effective use of the cleaning product including precautions you should take when applying the product, such as wearing gloves or eye protection and making sure you have good ventilation during use of the product. Remove gloves and wash hands immediately after cleaning.  Monitor yourself for signs and symptoms of illness Caregivers and household members are considered close contacts, should monitor their health,  and will be asked to limit movement outside of the home to the extent possible. Follow the monitoring steps for close contacts listed on the symptom monitoring form.   ? If you have additional questions, contact your local health department or call the epidemiologist on call at [phone] (available 24/7). ? This guidance is subject to change. For the most up-to-date guidance from [facility], please refer to their website: YouBlogs.pl   Increase activity slowly   Complete by: As directed      Allergies as of 03/05/2020   No Known Allergies     Medication List    STOP taking these medications   predniSONE 10 MG tablet Commonly known as: DELTASONE     TAKE these medications   acetaminophen 325 MG tablet Commonly known as: TYLENOL Take 2 tablets (650 mg total) by mouth every 6 (six) hours as needed (pain  or temperature).   albuterol (2.5 MG/3ML) 0.083% nebulizer solution Commonly known as: PROVENTIL Inhale 3 mLs (2.5 mg total) into the lungs every 4 (four) hours as needed for wheezing or shortness of breath.   albuterol 108 (90 Base) MCG/ACT inhaler Commonly known as: VENTOLIN HFA Inhale 1 puff into the lungs every 4 (four) hours as needed for wheezing or shortness of breath.   ascorbic acid 500 MG tablet Commonly known as: VITAMIN C Take 1 tablet (500 mg total) by mouth daily.   aspirin EC 325 MG tablet Take 325 mg by mouth daily.   atorvastatin 20 MG tablet Commonly known as: LIPITOR Take 1 tablet (20 mg total) by mouth daily at 6 PM.   baclofen 20 MG tablet Commonly known as: LIORESAL Take 2 tablets (40 mg total) by mouth 4 (four) times daily. For spasticity   calcium citrate 950 (200 Ca) MG tablet Commonly known as: CALCITRATE - dosed in mg elemental calcium Take 1 tablet (200 mg of elemental calcium total) by mouth daily.   cholecalciferol 25 MCG (1000 UNIT) tablet Commonly known as: VITAMIN D Take 1  tablet (1,000 Units total) by mouth daily.   colchicine 0.6 MG tablet Take 1 tablet (0.6 mg total) by mouth daily.   docusate sodium 100 MG capsule Commonly known as: COLACE Take 1 capsule (100 mg total) by mouth 2 (two) times daily as needed for mild constipation. What changed:   when to take this  reasons to take this   furosemide 40 MG tablet Commonly known as: LASIX Take 1 tablet (40 mg total) by mouth daily.   gabapentin 300 MG capsule Commonly known as: NEURONTIN Take 300 mg by mouth at bedtime.   guaiFENesin 600 MG 12 hr tablet Commonly known as: MUCINEX Take 2 tablets (1,200 mg total) by mouth 2 (two) times daily.   HYDROcodone-acetaminophen 5-325 MG tablet Commonly known as: NORCO/VICODIN Take 1 tablet by mouth every 6 (six) hours as needed for moderate pain or severe pain. What changed: reasons to take this   latanoprost 0.005 % ophthalmic solution Commonly known as: XALATAN Place 1 drop into both eyes at bedtime.   levothyroxine 175 MCG tablet Commonly known as: SYNTHROID Take 175 mcg by mouth daily.   methocarbamol 750 MG tablet Commonly known as: ROBAXIN Take 1 tablet (750 mg total) by mouth every 6 (six) hours as needed for muscle spasms.   metoprolol succinate 25 MG 24 hr tablet Commonly known as: TOPROL-XL Take 1 tablet (25 mg total) by mouth daily.   MiraLax 17 GM/SCOOP powder Generic drug: polyethylene glycol powder Take 0.5 Containers by mouth daily. 17 gm every other day   multivitamin tablet Take 1 tablet by mouth daily.   nitroGLYCERIN 0.4 MG SL tablet Commonly known as: NITROSTAT Place 0.4 mg every 5 (five) minutes as needed under the tongue for chest pain.   ondansetron 4 MG tablet Commonly known as: ZOFRAN Take 1 tablet (4 mg total) by mouth every 6 (six) hours as needed for nausea.   pantoprazole 40 MG tablet Commonly known as: PROTONIX Take 1 tablet (40 mg total) by mouth every evening. What changed:   when to take  this  reasons to take this   rizatriptan 10 MG tablet Commonly known as: MAXALT Take 10 mg by mouth daily as needed for migraine.   senna-docusate 8.6-50 MG tablet Commonly known as: Senokot-S Take 2 tablets by mouth 2 (two) times daily. What changed: when to take this   Trelegy Ellipta  100-62.5-25 MCG/INH Aepb Generic drug: Fluticasone-Umeclidin-Vilant Inhale 1 puff into the lungs daily.   zinc sulfate 220 (50 Zn) MG capsule Take 1 capsule (220 mg total) by mouth daily.            Discharge Care Instructions  (From admission, onward)         Start     Ordered   03/05/20 0000  Change dressing (specify)       Comments: See enclosed instructions   03/05/20 1313         No Known Allergies  Contact information for after-discharge care    Destination    [facility] .   Service: Skilled Nursing Contact information: [address] [place] [phone]                   The results of significant diagnostics from this hospitalization (including imaging, microbiology, ancillary and laboratory) are listed below for reference.    Significant Diagnostic Studies: DG Chest Portable 1 View  Result Date: 02/27/2020 CLINICAL DATA:  COVID, shortness of breath. EXAM: PORTABLE CHEST 1 VIEW COMPARISON:  02/19/2020 chest radiograph and prior. FINDINGS: Prior right upper lung infiltrate is less conspicuous than prior exam. No new focal opacity. No pneumothorax or pleural effusion. Cardiomediastinal silhouette is unchanged. No acute osseous abnormality. IMPRESSION: Decreased conspicuity of right upper lung opacities. No new focal opacities. Electronically Signed   By: Primitivo Gauze M.D.   On: 02/27/2020 14:02   DG Chest Port 1 View  Result Date: 02/19/2020 CLINICAL DATA:  Sepsis EXAM: PORTABLE CHEST 1 VIEW COMPARISON:  11/10/2019 FINDINGS: Lung volumes are small and pulmonary insufflation has diminished since prior  examination. Focal pulmonary infiltrate has developed within the right upper lobe, possibly infectious or inflammatory in nature. No pneumothorax or pleural effusion. Cardiac size within normal limits. Thoracic aorta is tortuous and ectatic, unchanged. Pulmonary vascularity is normal. No acute bone abnormality. IMPRESSION: Focal pulmonary infiltrate has developed within the right upper lobe, possibly infectious or inflammatory in nature. Electronically Signed   By: Fidela Salisbury MD   On: 02/19/2020 20:50    Microbiology: Recent Results (from the past 240 hour(s))  SARS Coronavirus 2 by RT PCR (hospital order, performed in [facility] hospital lab) Nasopharyngeal Nasopharyngeal Swab     Status: Abnormal   Collection Time: 03/01/20  1:32 PM   Specimen: Nasopharyngeal Swab  Result Value Ref Range Status   SARS Coronavirus 2 POSITIVE (A) NEGATIVE Final    Comment: RESULT CALLED TO, READ BACK BY AND VERIFIED WITH: PARRISH,K. RN @1600  03/01/20 BILLINGSLEY,L (NOTE) SARS-CoV-2 target nucleic acids are DETECTED  SARS-CoV-2 RNA is generally detectable in upper respiratory specimens  during the acute phase of infection.  Positive results are indicative  of the presence of the identified virus, but do not rule out bacterial infection or co-infection with other pathogens not detected by the test.  Clinical correlation with patient history and  other diagnostic information is necessary to determine patient infection status.  The expected result is negative.  Fact Sheet for Patients:   StrictlyIdeas.no   Fact Sheet for Healthcare Providers:   BankingDealers.co.za    This test is not yet approved or cleared by the [place] FDA and  has been authorized for detection and/or diagnosis of SARS-CoV-2 by FDA under an Emergency Use Authorization (EUA).  This EUA will remain in effect (meaning  this test can be used) for the duration of  the COVID-19  declaration  under Section 564(b)(1) of the Act, 21 U.S.C. section 360-bbb-3(b)(1), unless the authorization is terminated or revoked sooner.  Performed at [facility], [place] [address], [place], [place] [address]      Labs: Basic Metabolic Panel: Recent Labs  Lab 02/27/20 1320 02/28/20 0421 02/29/20 0428 03/04/20 0403 03/05/20 0403  NA 137 140 140 140 141  K 4.4 3.4* 3.6 3.8 4.1  CL 95* 95* 96* 103 103  CO2 30 30 31 27 29   GLUCOSE 87 73 79 101* 99  BUN 29* 29* 29* 18 22  CREATININE 0.79 0.73 0.88 0.77 0.71  CALCIUM 8.5* 8.7* 8.8* 8.4* 8.8*  MG  --   --   --  2.4 2.6*  PHOS  --   --   --  3.0 3.4   Liver Function Tests: Recent Labs  Lab 03/04/20 0403 03/05/20 0403  AST 16 15  ALT 14 16  ALKPHOS 77 76  BILITOT 0.4 0.6  PROT 6.6 6.9  ALBUMIN 3.4* 3.5   No results for input(s): LIPASE, AMYLASE in the last 168 hours. No results for input(s): AMMONIA in the last 168 hours. CBC: Recent Labs  Lab 02/27/20 1320 02/28/20 0421 02/29/20 0428 03/04/20 0403 03/05/20 0403  WBC 8.7 8.8 8.6 7.6 10.0  NEUTROABS 6.6  --   --  4.6 7.1  HGB 14.2 13.8 13.5 13.2 13.3  HCT 43.9 43.1 42.6 42.2 41.6  MCV 83.6 84.7 85.4 86.7 85.6  PLT 309 298 269 301 297   Cardiac Enzymes: No results for input(s): CKTOTAL, CKMB, CKMBINDEX, TROPONINI in the last 168 hours. BNP: BNP (last 3 results) Recent Labs    10/03/19 0557  BNP 45.4    ProBNP (last 3 results) No results for input(s): PROBNP in the last 8760 hours.  CBG: Recent Labs  Lab 03/04/20 1145 03/04/20 1641 03/04/20 2002 03/05/20 0737 03/05/20 1145  GLUCAP 113* 102* 142* 98 89       Signed:  Dia Crawford, MD [facility] [phone] pager

## 2020-03-05 NOTE — TOC Transition Note (Addendum)
Transition of Care [facility]) - CM/SW Discharge Note   Patient Details  Name: ALEXYS GASSETT MRN: [id number] Date of Birth: 08/13/1940  [facility] CM/SW Contact:  Lia Hopping, [place] Phone Number: 03/05/2020, 12:49 PM   Clinical Narrative:    [facility] offered a bed. Ready to accept the patient today. CSW notified patient daughter Roselyn Reef.  CSW confirm the facility will have an air mattress onsite for the patient.  CSW initiated a new authorization Ref# [id number] and faxed clinicals-notified insurance patient will discharge today under waiver.  Nurse call report to: [phone] Room 801A. Daughter reports the pt. Cpap is at bedside. Nurse Please send Cpap w/ the patient.  PTAR to transport.     Final next level of care: Skilled Nursing Facility Barriers to Discharge: Barriers Resolved   Patient Goals and CMS Choice Patient states their goals for this hospitalization and ongoing recovery are:: to go home with caregivers CMS Medicare.gov Compare Post Acute Care list provided to:: Other (Comment Required) (Daughter Roselyn Reef) Choice offered to / list presented to : Adult Children  Discharge Placement   Existing PASRR number confirmed : 03/01/20          Patient chooses bed at: [facility] Patient to be transferred to facility by: [place] Name of family member notified: Daughter Roselyn Reef Patient and family notified of of transfer: 03/05/20  Discharge Plan and Services   Discharge Planning Services: CM Consult Post Acute Care Choice: [place]          DME Arranged: N/A DME Agency: NA                  Social Determinants of Health ([place]) Interventions     Readmission Risk Interventions No flowsheet data found.

## 2020-03-12 ENCOUNTER — Encounter: Payer: Medicare Other | Admitting: Physical Medicine and Rehabilitation

## 2020-03-26 ENCOUNTER — Other Ambulatory Visit: Payer: Self-pay | Admitting: Cardiology

## 2020-03-26 ENCOUNTER — Other Ambulatory Visit: Payer: Self-pay | Admitting: Physical Medicine and Rehabilitation

## 2020-03-26 NOTE — Telephone Encounter (Signed)
Rx has been sent to the pharmacy electronically. ° °

## 2020-04-05 ENCOUNTER — Emergency Department (HOSPITAL_COMMUNITY)
Admission: EM | Admit: 2020-04-05 | Discharge: 2020-04-06 | Disposition: A | Discharge status: Home / Self Care | Payer: Medicare Other | Attending: Emergency Medicine | Admitting: Emergency Medicine

## 2020-04-05 ENCOUNTER — Encounter (HOSPITAL_COMMUNITY): Payer: Self-pay

## 2020-04-05 ENCOUNTER — Emergency Department (HOSPITAL_COMMUNITY): Payer: Medicare Other

## 2020-04-05 ENCOUNTER — Other Ambulatory Visit: Payer: Self-pay

## 2020-04-05 DIAGNOSIS — Z85850 Personal history of malignant neoplasm of thyroid: Secondary | ICD-10-CM | POA: Insufficient documentation

## 2020-04-05 DIAGNOSIS — R0602 Shortness of breath: Secondary | ICD-10-CM | POA: Diagnosis not present

## 2020-04-05 DIAGNOSIS — I110 Hypertensive heart disease with heart failure: Secondary | ICD-10-CM | POA: Insufficient documentation

## 2020-04-05 DIAGNOSIS — Z79899 Other long term (current) drug therapy: Secondary | ICD-10-CM | POA: Diagnosis not present

## 2020-04-05 DIAGNOSIS — Z87891 Personal history of nicotine dependence: Secondary | ICD-10-CM | POA: Diagnosis not present

## 2020-04-05 DIAGNOSIS — Z96653 Presence of artificial knee joint, bilateral: Secondary | ICD-10-CM | POA: Insufficient documentation

## 2020-04-05 DIAGNOSIS — E039 Hypothyroidism, unspecified: Secondary | ICD-10-CM | POA: Insufficient documentation

## 2020-04-05 DIAGNOSIS — E1140 Type 2 diabetes mellitus with diabetic neuropathy, unspecified: Secondary | ICD-10-CM | POA: Insufficient documentation

## 2020-04-05 DIAGNOSIS — Z7982 Long term (current) use of aspirin: Secondary | ICD-10-CM | POA: Insufficient documentation

## 2020-04-05 DIAGNOSIS — J449 Chronic obstructive pulmonary disease, unspecified: Secondary | ICD-10-CM | POA: Diagnosis not present

## 2020-04-05 DIAGNOSIS — Z8616 Personal history of COVID-19: Secondary | ICD-10-CM | POA: Insufficient documentation

## 2020-04-05 DIAGNOSIS — I5032 Chronic diastolic (congestive) heart failure: Secondary | ICD-10-CM | POA: Insufficient documentation

## 2020-04-05 LAB — COMPREHENSIVE METABOLIC PANEL
ALT: 6 U/L (ref 0–44)
AST: 13 U/L — ABNORMAL LOW (ref 15–41)
Albumin: 3.8 g/dL (ref 3.5–5.0)
Alkaline Phosphatase: 90 U/L (ref 38–126)
Anion gap: 13 (ref 5–15)
BUN: 25 mg/dL — ABNORMAL HIGH (ref 8–23)
CO2: 33 mmol/L — ABNORMAL HIGH (ref 22–32)
Calcium: 9.2 mg/dL (ref 8.9–10.3)
Chloride: 97 mmol/L — ABNORMAL LOW (ref 98–111)
Creatinine, Ser: 0.84 mg/dL (ref 0.61–1.24)
GFR, Estimated: >60 mL/min (ref >60)
Glucose, Bld: 89 mg/dL (ref 70–99)
Potassium: 4 mmol/L (ref 3.5–5.1)
Sodium: 143 mmol/L (ref 135–145)
Total Bilirubin: 0.6 mg/dL (ref 0.3–1.2)
Total Protein: 7.2 g/dL (ref 6.5–8.1)

## 2020-04-05 LAB — CBC WITH DIFFERENTIAL/PLATELET
Abs Immature Granulocytes: 0.17 10*3/uL — ABNORMAL HIGH (ref 0.00–0.07)
Basophils Absolute: 0.1 10*3/uL (ref 0.0–0.1)
Basophils Relative: 1 %
Eosinophils Absolute: 0.5 10*3/uL (ref 0.0–0.5)
Eosinophils Relative: 3 %
HCT: 44.4 % (ref 39.0–52.0)
Hemoglobin: 13.8 g/dL (ref 13.0–17.0)
Immature Granulocytes: 1 %
Lymphocytes Relative: 11 %
Lymphs Abs: 1.6 10*3/uL (ref 0.7–4.0)
MCH: 26.9 pg (ref 26.0–34.0)
MCHC: 31.1 g/dL (ref 30.0–36.0)
MCV: 86.5 fL (ref 80.0–100.0)
Monocytes Absolute: 1 10*3/uL (ref 0.1–1.0)
Monocytes Relative: 7 %
Neutro Abs: 11.2 10*3/uL — ABNORMAL HIGH (ref 1.7–7.7)
Neutrophils Relative %: 77 %
Platelets: 291 10*3/uL (ref 150–400)
RBC: 5.13 MIL/uL (ref 4.22–5.81)
RDW: 17.2 % — ABNORMAL HIGH (ref 11.5–15.5)
WBC: 14.5 10*3/uL — ABNORMAL HIGH (ref 4.0–10.5)
nRBC: 0 % (ref 0.0–0.2)

## 2020-04-05 LAB — PROTIME-INR
INR: 1 (ref 0.8–1.2)
Prothrombin Time: 12.7 seconds (ref 11.4–15.2)

## 2020-04-05 LAB — BRAIN NATRIURETIC PEPTIDE: B Natriuretic Peptide: 52.3 pg/mL (ref 0.0–100.0)

## 2020-04-05 LAB — LACTIC ACID, PLASMA: Lactic Acid, Venous: 1.1 mmol/L (ref 0.5–1.9)

## 2020-04-05 LAB — APTT: aPTT: 29 seconds (ref 24–36)

## 2020-04-05 IMAGING — DX DG CHEST 1V PORT
1 series · 1 of 1 positions shown · non-contrast
Comparison: [DATE]

CLINICAL DATA: Weakness

EXAM:
PORTABLE CHEST 1 VIEW

[chest ap]
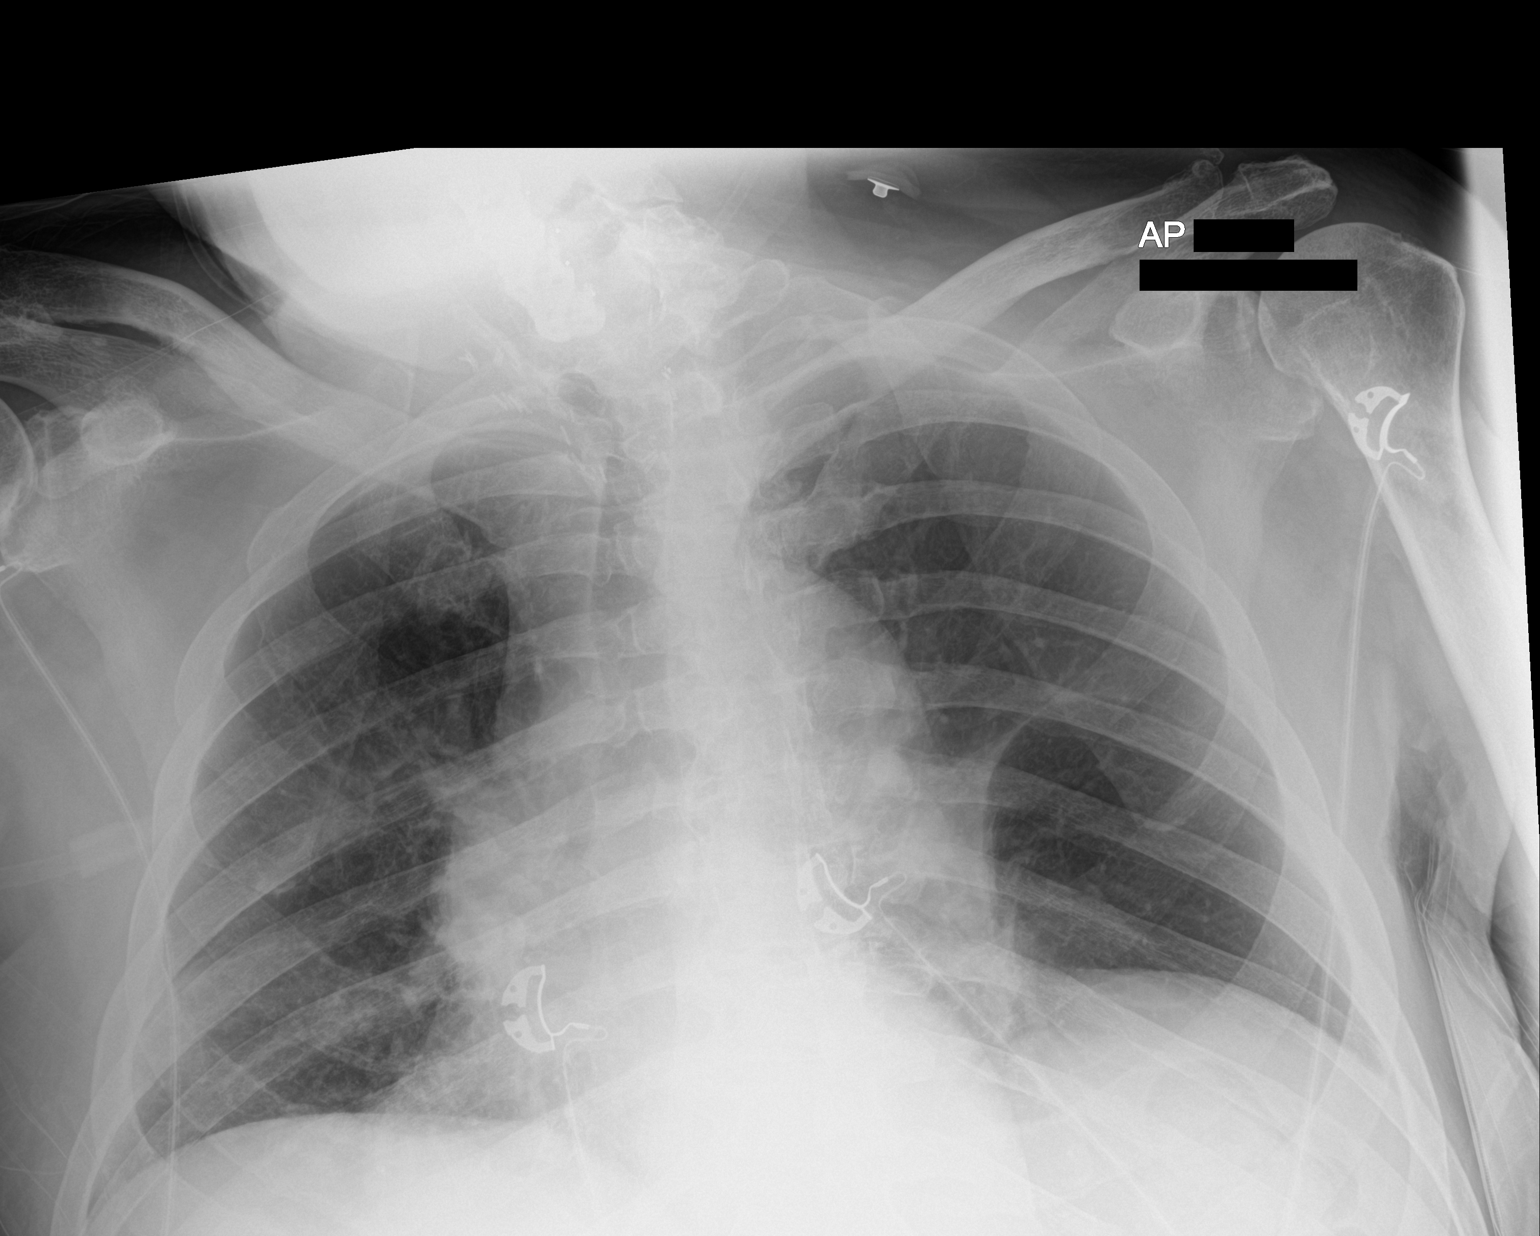

[1 of 1 positions shown; findings below may reference images not displayed]

FINDINGS: Lung volumes are small. Opacity within the right mid lung zone
likely represents a confluence of osseous and vascular shadow on
this rotated patient. The lungs are otherwise clear. No pneumothorax
or pleural effusion. Cardiac size within normal limits. Pulmonary
vascularity is normal. No acute bone abnormality.
IMPRESSION: Pulmonary hypoinflation.

## 2020-04-05 MED ORDER — GABAPENTIN 300 MG PO CAPS
300.0000 mg | ORAL_CAPSULE | Freq: Once | ORAL | Status: AC
  Administered 2020-04-05: 300 mg via ORAL
  Filled 2020-04-05: qty 1

## 2020-04-05 MED ORDER — HYDROCODONE-ACETAMINOPHEN 5-325 MG PO TABS
1.0000 | ORAL_TABLET | Freq: Once | ORAL | Status: AC
  Administered 2020-04-05: 1 via ORAL
  Filled 2020-04-05: qty 1

## 2020-04-05 MED ORDER — METHOCARBAMOL 500 MG PO TABS
750.0000 mg | ORAL_TABLET | Freq: Once | ORAL | Status: AC
  Administered 2020-04-05: 750 mg via ORAL
  Filled 2020-04-05: qty 2

## 2020-04-05 MED ORDER — BACLOFEN 10 MG PO TABS
20.0000 mg | ORAL_TABLET | Freq: Once | ORAL | Status: AC
  Administered 2020-04-05: 20 mg via ORAL
  Filled 2020-04-05: qty 2

## 2020-04-05 NOTE — ED Provider Notes (Signed)
[facility] DEPT Provider Note   CSN: [id number] Arrival date & time: 04/05/20  1357     History Chief Complaint  Patient presents with  . Shortness of Breath    [name] is a 79 y.o. male.  Patient with a history of COPD and Covid pneumonia.  He is usually on 2 L nasal at the nursing home.  Supposedly his sats are dropping down into the 80s.  Patient does not complain of any shortness of breath he does state he has a cough but has been coughing for quite some time  The history is provided by the patient.  Shortness of Breath Severity:  Mild Onset quality:  Unable to specify Timing:  Constant Progression:  Unchanged Chronicity:  Recurrent Context: activity   Relieved by:  Nothing Associated symptoms: no abdominal pain, no chest pain, no cough, no headaches and no rash        Past Medical History:  Diagnosis Date  . Arthritis   . Barrett esophagus   . Bronchitis   . Carotid artery occlusion   . COPD (chronic obstructive pulmonary disease) ([place])   . Diabetes mellitus without complication ([place])   . GERD (gastroesophageal reflux disease)   . Headache    migraines  . History of thyroid cancer 2008  . Hypertension   . Restless leg   . Sleep apnea with use of continuous positive airway pressure (CPAP)    2011 [facility] , AHI  77cn central and obstrcutive. 16 cm water , 3 cm EPR.   . Thoracic ascending aortic aneurysm (HCC)    4.2 cm ascending TAA 08/18/17 CTA. Annual imaging recommended.  . thyroid ca dx'd 2009 or 2010   surg and radioactive isoptope  . Ulcer    Peptic ulcer disease    Patient Active Problem List   Diagnosis Date Noted  . Generalized weakness 02/27/2020  . Functional quadriplegia ([place]) 02/27/2020  . Pneumonia due to COVID-19 virus 02/20/2020  . Acute hypoxemic respiratory failure due to COVID-19 ([place]) 02/19/2020  . Hyperlipidemia associated with type 2 diabetes mellitus ([place]) 02/19/2020  . Chronic  indwelling Foley catheter 02/19/2020  . COPD (chronic obstructive pulmonary disease) ([place]) 02/19/2020  . Educated about COVID-19 virus infection 01/22/2020  . Stage 4 skin ulcer of sacral region ([place]) 01/12/2020  . Hypothyroidism   . Palliative care by specialist   . DNR (do not resuscitate)   . Acute on chronic respiratory failure with hypoxia ([place]) 10/29/2019  . Paraplegia ([place]) 10/28/2019  . Labile blood pressure   . Spasticity   . Muscle spasm   . Hypoxia   . Supplemental oxygen dependent   . Neurogenic bladder   . Chronic diastolic congestive heart failure ([place])   . Neurogenic bowel   . Hypocalcemia   . Spinal stenosis of lumbar region   . 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 Diabetes mellitus type 2, controlled, with complications ([place]) 94/50/3888  . Obese 09/17/2019  . Thoracic aortic aneurysm ([place]) 09/17/2019  . Bowel incontinence 09/17/2019  . Bladder incontinence 09/17/2019  . Right hemiparesis ([place]) 09/17/2019  . Acute renal failure (ARF) ([place]) 09/17/2019  . Left hemiparesis ([place]) 09/17/2019  . Chronic respiratory failure with hypoxia ([place]) 09/17/2019  . Syncope and collapse 09/17/2019  . Chronic diastolic CHF (congestive heart failure) ([place]) 09/04/2019  . PVD (peripheral vascular disease) ([place]) 02/14/2019  . Hypertension associated with diabetes ([place]) 02/13/2019  . Non-insulin treated type 2 diabetes mellitus ([place]) 02/13/2019  . Community  acquired pneumonia of right lower lobe of lung 01/17/2019  . Postural urinary incontinence 05/20/2018  . Myelopathy concurrent with and due to spinal stenosis of cervical region ([place]) 05/20/2018  . Myoclonic jerking 05/20/2018  . 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 History of respiratory failure 08/18/2017  . Hypokalemia 08/18/2017  . Leukocytosis 08/18/2017  . Muscle atrophy of lower extremity 08/09/2017  . Bowel and bladder incontinence 08/09/2017  . Right-sided muscle  weakness 08/09/2017  . Neuropathy 08/09/2017  . 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/2017  . Abnormality of gait 07/12/2017  . Myoclonic jerkings, 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/2017  . Acquired right foot drop 07/12/2017  . UTI (urinary tract infection) due to Enterococcus 07/12/2017  . Pressure ulcer 06/21/2017  . Acute cystitis 06/20/2017  . Weakness generalized 06/20/2017  . Dehydration 06/20/2017  . Dyspnea 12/26/2016  . Chronic cough 12/26/2016  . Hypersomnia with sleep apnea 10/30/2016  . CSA (central sleep apnea) 10/30/2016  . Wheezing symptom 10/30/2016  . 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 RLS (restless legs syndrome) 08/25/2014  . Primary gout 08/25/2014  . OSA on CPAP 08/25/2014  . Severe obesity (BMI >= 40) ([place]) 08/25/2014  . Obesity (BMI 30-39.9) 02/18/2013  . 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 S/P TKR (total knee replacement) 11/29/2012  . History of thyroid cancer     Past Surgical History:  Procedure Laterality Date  . ANTERIOR CERVICAL DECOMP/DISCECTOMY FUSION N/A 09/03/2017   Procedure: ACDF - C3-C4 - C4-C5 - C5-C6;  Surgeon: Kary Kos, MD;  Location: [facility];  Service: Neurosurgery;  Laterality: N/A;  . CAROTID ENDARTERECTOMY Left January 03, 2006   Dr. Amedeo Plenty  . COLONOSCOPY    . EYE SURGERY Bilateral    cataract surgery with lens implants  . game keepers thumb Right   . JOINT REPLACEMENT Left    knee X 2  . LUMBAR LAMINECTOMY/ DECOMPRESSION WITH MET-RX N/A 11/04/2019   Procedure: LUMBAR THREE-FOUR LUMBAR LAMINECTOMY/ DECOMPRESSION WITH MET-RX;  Surgeon: Judith Part, MD;  Location: [facility];  Service: Neurosurgery;  Laterality: N/A;  . RADIOLOGY WITH ANESTHESIA N/A 10/16/2019   Procedure: MRI THORACIC LUMBAR SPINE;  Surgeon: Radiologist, Medication, MD;  Location: [facility];  Service: Radiology;  Laterality: N/A;  . ROTATOR CUFF REPAIR Right   . THYROIDECTOMY  2008       Family  History  Problem Relation Age of Onset  . COPD Mother   . Hyperlipidemia Mother   . Hypertension Mother   . Diabetes Father   . Kidney disease Father        ESRD  . Hypertension Father   . Cancer Father   . Hyperlipidemia Father     Social History   Tobacco Use  . Smoking status: Former Smoker    Years: 40.00    Types: Cigars, Cigarettes    Quit date: 06/19/2005    Years since quitting: 14.8  . Smokeless tobacco: Never Used  . Tobacco comment: smoked 4-6 cigars daily, only smoked cigarettes socially  Vaping Use  . Vaping Use: Never used  Substance Use Topics  . Alcohol use: No  . Drug use: No    Home Medications Prior to Admission medications   Medication Sig Start Date End Date Taking? Authorizing Provider  HYDROcodone-acetaminophen (NORCO/VICODIN) 5-325 MG tablet TAKE 1 TABLET BY MOUTH EVERY 6 HOURS AS NEEDED FOR MODERATE PAIN OR SEVERE PAIN. 03/26/20   Lovorn,  Jinny Blossom, MD  acetaminophen (TYLENOL) 325 MG tablet Take 2 tablets (650 mg total) by mouth every 6 (six) hours as needed (pain or temperature). 10/24/19   Angiulli, Lavon Paganini, PA-C  albuterol (PROVENTIL) (2.5 MG/3ML) 0.083% nebulizer solution Inhale 3 mLs (2.5 mg total) into the lungs every 4 (four) hours as needed for wheezing or shortness of breath. 10/24/19   Angiulli, Lavon Paganini, PA-C  albuterol (VENTOLIN HFA) 108 (90 Base) MCG/ACT inhaler Inhale 1 puff into the lungs every 4 (four) hours as needed for wheezing or shortness of breath. 11/20/19   Angiulli, Lavon Paganini, PA-C  aspirin EC 325 MG tablet Take 325 mg by mouth daily.    [provider]  atorvastatin (LIPITOR) 20 MG tablet Take 1 tablet (20 mg total) by mouth daily at 6 PM. 11/20/19   Angiulli, Lavon Paganini, PA-C  baclofen (LIORESAL) 20 MG tablet Take 2 tablets (40 mg total) by mouth 4 (four) times daily. For spasticity 01/12/20   Lovorn, Jinny Blossom, MD  calcium citrate (CALCITRATE - DOSED IN MG ELEMENTAL CALCIUM) 950 (200 Ca) MG tablet Take 1 tablet (200 mg of elemental  calcium total) by mouth daily. 11/20/19   Angiulli, Lavon Paganini, PA-C  cholecalciferol (VITAMIN D) 25 MCG (1000 UNIT) tablet Take 1 tablet (1,000 Units total) by mouth daily. 11/20/19   Angiulli, Lavon Paganini, PA-C  colchicine 0.6 MG tablet Take 1 tablet (0.6 mg total) by mouth daily. 11/20/19   Angiulli, Lavon Paganini, PA-C  docusate sodium (COLACE) 100 MG capsule Take 1 capsule (100 mg total) by mouth 2 (two) times daily as needed for mild constipation. 03/05/20   Allie Bossier, MD  furosemide (LASIX) 40 MG tablet Take 1 tablet (40 mg total) by mouth daily. 11/20/19   Angiulli, Lavon Paganini, PA-C  gabapentin (NEURONTIN) 300 MG capsule Take 300 mg by mouth at bedtime.     [provider]  guaiFENesin (MUCINEX) 600 MG 12 hr tablet Take 2 tablets (1,200 mg total) by mouth 2 (two) times daily. 11/20/19   Angiulli, Lavon Paganini, PA-C  latanoprost (XALATAN) 0.005 % ophthalmic solution Place 1 drop into both eyes at bedtime. 10/24/19   Angiulli, Lavon Paganini, PA-C  levothyroxine (SYNTHROID) 175 MCG tablet Take 175 mcg by mouth daily. 02/25/20   [provider]  methocarbamol (ROBAXIN) 750 MG tablet Take 1 tablet (750 mg total) by mouth every 6 (six) hours as needed for muscle spasms. 11/20/19   Angiulli, Lavon Paganini, PA-C  metoprolol succinate (TOPROL-XL) 25 MG 24 hr tablet Take 1 tablet (25 mg total) by mouth daily. 11/21/19   Angiulli, Lavon Paganini, PA-C  metoprolol succinate (TOPROL-XL) 50 MG 24 hr tablet TAKE 1 TABLET BY MOUTH DAILY. TAKE WITH OR IMMEDIATELY FOLLOWING A MEAL. 03/26/20   Minus Breeding, MD  Multiple Vitamin (MULTIVITAMIN) tablet Take 1 tablet by mouth daily.    [provider]  nitroGLYCERIN (NITROSTAT) 0.4 MG SL tablet Place 0.4 mg every 5 (five) minutes as needed under the tongue for chest pain.    [provider]  ondansetron (ZOFRAN) 4 MG tablet Take 1 tablet (4 mg total) by mouth every 6 (six) hours as needed for nausea. 03/05/20   Allie Bossier, MD  pantoprazole (PROTONIX) 40 MG tablet Take  1 tablet (40 mg total) by mouth every evening. Patient taking differently: Take 40 mg by mouth daily as needed (indigestion).  11/20/19   Angiulli, Lavon Paganini, PA-C  polyethylene glycol powder (MIRALAX) 17 GM/SCOOP powder Take 0.5 Containers by mouth daily.  17 gm every other day     [provider]  rizatriptan (MAXALT) 10 MG tablet Take 10 mg by mouth daily as needed for migraine.  12/08/19   [provider]  senna-docusate (SENOKOT-S) 8.6-50 MG tablet Take 2 tablets by mouth 2 (two) times daily. Patient taking differently: Take 2 tablets by mouth at bedtime.  11/10/19   Hosie Poisson, MD  TRELEGY ELLIPTA 100-62.5-25 MCG/INH AEPB Inhale 1 puff into the lungs daily.  05/01/18   [provider]    Allergies    Patient has no known allergies.  Review of Systems   Review of Systems  Constitutional: Negative for appetite change and fatigue.  HENT: Negative for congestion, ear discharge and sinus pressure.   Eyes: Negative for discharge.  Respiratory: Positive for shortness of breath. Negative for cough.   Cardiovascular: Negative for chest pain.  Gastrointestinal: Negative for abdominal pain and diarrhea.  Genitourinary: Negative for frequency and hematuria.  Musculoskeletal: Negative for back pain.  Skin: Negative for rash.  Neurological: Negative for seizures and headaches.  Psychiatric/Behavioral: Negative for hallucinations.    Physical Exam Updated Vital Signs BP 138/68   Pulse 65   Temp 98.2 F (36.8 C) (Oral)   Resp 15   Ht _0  (1.702 m)   Wt 77.6 kg   SpO2 96%   BMI 26.78 kg/m   Physical Exam Vitals and nursing note reviewed.  Constitutional:      Appearance: He is well-developed.  HENT:     Head: Normocephalic.     Nose: Nose normal.  Eyes:     General: No scleral icterus.    Conjunctiva/sclera: Conjunctivae normal.  Neck:     Thyroid: No thyromegaly.  Cardiovascular:     Rate and Rhythm: Normal rate and regular rhythm.     Heart  sounds: No murmur heard.  No friction rub. No gallop.   Pulmonary:     Breath sounds: No stridor. Rales present. No wheezing.  Chest:     Chest wall: No tenderness.  Abdominal:     General: There is no distension.     Tenderness: There is no abdominal tenderness. There is no rebound.  Musculoskeletal:        General: Normal range of motion.     Cervical back: Neck supple.  Lymphadenopathy:     Cervical: No cervical adenopathy.  Skin:    Findings: No erythema or rash.  Neurological:     Mental Status: He is alert and oriented to person, place, and time.     Motor: No abnormal muscle tone.     Coordination: Coordination normal.  Psychiatric:        Behavior: Behavior normal.     ED Results / Procedures / Treatments   Labs (all labs ordered are listed, but only abnormal results are displayed) Labs Reviewed  CULTURE, BLOOD (SINGLE)  URINE CULTURE  LACTIC ACID, PLASMA  CBC WITH DIFFERENTIAL/PLATELET  COMPREHENSIVE METABOLIC PANEL  LACTIC ACID, PLASMA  PROTIME-INR  APTT  URINALYSIS, ROUTINE W REFLEX MICROSCOPIC  BRAIN NATRIURETIC PEPTIDE    EKG None  Radiology DG Chest Port 1 View  Result Date: 04/05/2020 CLINICAL DATA:  Weakness EXAM: PORTABLE CHEST 1 VIEW COMPARISON:  02/27/2020 FINDINGS: Lung volumes are small. Opacity within the right mid lung zone likely represents a confluence of osseous and vascular shadow on this rotated patient. The lungs are otherwise clear. No pneumothorax or pleural effusion. Cardiac size within normal limits. Pulmonary vascularity is normal. No acute bone  abnormality. IMPRESSION: Pulmonary hypoinflation. Electronically Signed   By: Fidela Salisbury MD   On: 04/05/2020 15:02    Procedures Procedures (including critical care time)  Medications Ordered in ED Medications  methocarbamol (ROBAXIN) tablet 750 mg (750 mg Oral Given 04/05/20 1510)    ED Course  I have reviewed the triage vital signs and the nursing notes.  Pertinent labs &  imaging results that were available during my care of the patient were reviewed by me and considered in my medical decision making (see chart for details).    MDM Rules/Calculators/A&P                          Patient with alleged hypoxia in the nursing home on 2 L.  Patient also has congestion in his left lungs.  Septic labs will be drawn along with chest x-ray.  Patient's O2 sat now is over 90 on 2 L.  He will have his disposition done by Dr. Sedonia Small after the labs are back Final Clinical Impression(s) / ED Diagnoses Final diagnoses:  None    Rx / DC Orders ED Discharge Orders    None       Milton Ferguson, MD 04/09/20 0945

## 2020-04-05 NOTE — Discharge Instructions (Addendum)
You were evaluated in the Emergency Department and after careful evaluation, we did not find any emergent condition requiring admission or further testing in the hospital.  Your exam/testing today was overall reassuring.  Please return to the Emergency Department if you experience any worsening of your condition.  Thank you for allowing us to be a part of your care.  

## 2020-04-05 NOTE — ED Provider Notes (Signed)
  Provider Note MRN:  [id number]  Arrival date & time: 04/05/20    ED Course and Medical Decision Making  Assumed care from Dr. Roderic Palau at shift change.  History of COPD, recent admission last month for pneumonia, here with low oxygen saturations at care facility.  Baseline 3 L nasal cannula, has been on 5 L here but we were able to intermittently decrease him to his baseline.  Plan for follow-up of labs and reassessment of oxygenation to determine need for admission versus appropriateness of discharge.  Labs are reassuring, my reassessment patient is with oxygen saturations of 96 to 97% on 2.5 L nasal cannula.  There is some variation any drops to 92% when he is conversing but the oxygen saturations quickly returned to the mid to high 90s when he is not talking.  Possibly some chest congestion causing some intermittent variability in the oxygen saturation but a reassuring chest x-ray today, completely normal vital signs, no increased work of breathing, no chest pain, no leg pain or swelling, no signs of DVT on exam, doubt PE, no evidence of pneumonia, no fever, appropriate for discharge with close observation and PCP follow-up.  Procedures  Final Clinical Impressions(s) / ED Diagnoses     ICD-10-CM   1. SOB (shortness of breath)  R06.02     ED Discharge Orders    None        Discharge Instructions     You were evaluated in the Emergency Department and after careful evaluation, we did not find any emergent condition requiring admission or further testing in the hospital.  Your exam/testing today was overall reassuring.  Please return to the Emergency Department if you experience any worsening of your condition.  Thank you for allowing [place] to be a part of your care.      Barth Kirks. Sedonia Small, [place] [email or web address]    Maudie Flakes, MD 04/05/20 [phone]

## 2020-04-05 NOTE — ED Triage Notes (Signed)
EMS reports family called for [facility]. Physical Therapy came this morning and got SpO2 ~85%. Pt reports SHOB about a week. Hx COPD. Pt reports productive cough as of today. EMS reports rhonchi in bilateral upper lobes, rales bilaterally in lower lobes. Family stated last time he presented this way he had pneumonia.   BP 130/70 HR 60-70 RR 25 SpO2 85% increasing to 98% on 5L NRB CBG 105

## 2020-04-05 NOTE — ED Notes (Signed)
Provided pt with snack

## 2020-04-06 ENCOUNTER — Telehealth (HOSPITAL_BASED_OUTPATIENT_CLINIC_OR_DEPARTMENT_OTHER): Payer: Self-pay | Admitting: Emergency Medicine

## 2020-04-06 LAB — BLOOD CULTURE ID PANEL (REFLEXED) - BCID2

## 2020-04-06 NOTE — ED Notes (Signed)
[facility] called for update on transport, states they are not able to give me an update at this time.

## 2020-04-06 NOTE — ED Notes (Signed)
[facility] called, states pt is next in line for transport.

## 2020-04-06 NOTE — Telephone Encounter (Signed)
Received positive blood culture results from lab. Consulted with Dr. Johnney Killian who advise no further action necessary as this is likely contamination.

## 2020-04-08 LAB — CULTURE, BLOOD (SINGLE): Special Requests: ADEQUATE

## 2020-05-20 ENCOUNTER — Other Ambulatory Visit: Payer: Self-pay | Admitting: Cardiology

## 2020-06-01 ENCOUNTER — Telehealth: Payer: Self-pay

## 2020-06-01 NOTE — Telephone Encounter (Signed)
Spoke with patient's wife [name]. [name] reports patient is under Hospice care with [facility]. Will cancel Palliative referral and notify MD.

## 2020-07-06 NOTE — Therapy (Signed)
[facility] [address] [place], [place], [address] Phone: [phone]   Fax:  [phone]  Patient Details  Name: [name] MRN: [id number] Date of Birth: [birth year] Referring Provider:  No ref. provider found  Encounter Date: 07/06/2020 PHYSICAL THERAPY DISCHARGE SUMMARY/non visit discharge  Visits from Start of Care: 15  Current functional level related to goals / functional outcomes: Pt has not been seen since 09/10/19 as did not return. See below for most recent goal assessment at that time.   Remaining deficits: unknown   Education / Equipment: HEP  Plan: Patient agrees to discharge.  Patient goals were not met. Patient is being discharged due to not returning since the last visit.  ?????         PT Short Term Goals - 09/10/19 1821      PT SHORT TERM GOAL #1   Title Pt will be instructed in HEP for strengthening and balance activities to progress functional mobility on own safely with caregiver assist.    Baseline 05/09/19: met with current program. Adding hamstring stretch and standing at sink.    Status Achieved    Target Date 05/11/19      PT SHORT TERM GOAL #2   Title Pt will be able to perform sit to stand transfers from varied surfaces supervision for improved mobility.    Baseline Pt's sit to stand transfers still vary from supervision to CGA    Time 4    Period Weeks    Status Not Met    Target Date 09/10/19   4 weeks from next visit due to delay in schedule     PT SHORT TERM GOAL #3   Title Pt will ambulate 81' with FWW CGA for improved mobility in home.    Baseline Pt had met this goal last visit with 115' CGA but has not been able to replicate this with varying distances from 0 to 64' CGA/min assist.    Time 4    Period Weeks    Status Partially Met    Target Date 09/10/19           PT Long Term Goals - 08/11/19 1411      PT LONG TERM GOAL #1   Title Pt will increase 30 sec sit  to stand from 4 reps edge of mat to 6 or more for improved functional strength and mobility.    Baseline 4 reps on 08/13/19    Time 8    Period Weeks    Status On-going    Target Date 10/10/19      PT LONG TERM GOAL #2   Title Pt will be able to ambulate >100' with FWW supervision for improved household distances.    Baseline Pt's gait distance varied from 30' to 39' with RW on 08/13/19 CGA    Time 8    Period Weeks    Status On-going    Target Date 10/10/19      PT LONG TERM GOAL #3   Title Pt will be able to maintain standing x 5 min with minimal UE support for improved balance to assist with ADLs.    Baseline 3 min 4 sec at walker on 08/13/19    Time 8    Period Weeks    Status On-going    Target Date 10/10/19      PT LONG TERM GOAL #4   Title Pt will be able to perform progressive HEP for strengthening, balance, flexibility  and walking at home with family/caregiver assist.    Time 8    Period Weeks    Status New    Target Date 10/10/19      PT LONG TERM GOAL #5   Title Pt will ambulate up/down ramp min assist for better access to home with RW.    Time 8    Period Weeks    Status New    Target Date 10/10/19            Electa Sniff , PT, DPT, NCS 07/06/2020, 12:57 PM  [facility] [address] [place], [place], [address] Phone: [phone]   Fax:  [phone]

## 2020-07-09 ENCOUNTER — Ambulatory Visit: Payer: Medicare Other | Admitting: Podiatry

## 2020-07-21 ENCOUNTER — Ambulatory Visit: Payer: Medicare Other | Admitting: Podiatry

## 2020-07-21 ENCOUNTER — Encounter: Payer: Self-pay | Admitting: Podiatry

## 2020-07-21 ENCOUNTER — Other Ambulatory Visit: Payer: Self-pay

## 2020-07-21 DIAGNOSIS — M79674 Pain in right toe(s): Secondary | ICD-10-CM | POA: Diagnosis not present

## 2020-07-21 DIAGNOSIS — E1151 Type 2 diabetes mellitus with diabetic peripheral angiopathy without gangrene: Secondary | ICD-10-CM

## 2020-07-21 DIAGNOSIS — M79675 Pain in left toe(s): Secondary | ICD-10-CM

## 2020-07-21 DIAGNOSIS — B351 Tinea unguium: Secondary | ICD-10-CM | POA: Diagnosis not present

## 2020-07-21 DIAGNOSIS — L89899 Pressure ulcer of other site, unspecified stage: Secondary | ICD-10-CM | POA: Diagnosis not present

## 2020-07-27 ENCOUNTER — Encounter: Payer: Self-pay | Admitting: Podiatry

## 2020-07-27 NOTE — Progress Notes (Signed)
  Subjective:  Patient ID: [name], male    DOB: October 12, 1940,  MRN: [id number]  Chief Complaint  Patient presents with  . Debridement    Trim toenails   80 y.o. male returns for the above complaint.  His last A1c was 5.6.  Today he is here for debridement of the bilateral mycotic toenails that are painful in nature.  He states that he also noticed his left hallux distal tip pressure sore.  No ulceration noted.  There is mild erythema present.  He has been ambulating with his regular shoes.  He does not recall any pressure sore or points to that area.  Objective:  There were no vitals filed for this visit. Podiatric Exam: Vascular: dorsalis pedis and posterior tibial pulses are palpable bilateral. Capillary return is immediate. Temperature gradient is WNL. Skin turgor WNL  Sensorium: Normal Semmes Weinstein monofilament test. Normal tactile sensation bilaterally. Nail Exam: Pt has thick disfigured discolored nails with subungual debris noted bilateral entire nail hallux through fifth toenails Ulcer Exam: There is no evidence of ulcer or pre-ulcerative changes or infection. Orthopedic Exam: Muscle tone and strength are WNL. No limitations in general ROM. No crepitus or effusions noted. HAV  B/L.  Hammer toes 2-5  B/L. Skin: No Porokeratosis. No infection or ulcers.  Mild superficial pressure sore noted to the left distal hallux.  Assessment & Plan:  Patient was evaluated and treated and all questions answered.  Left hallux distal pressure sore -I explained to the patient the exact etiology of pressure sore and discussed various treatment options for it.  Patient is experiencing pressure to the distal tip of the hallux likely due to either shoes or his bed.  I encouraged that to continue monitoring the pressure sore and make sure that there is absolutely no pressure associated with it.  I discussed shoe gear modification with him.  He states understanding and will continue to monitor.  This  can easily turn into an ulceration and can end up losing a toe I discussed this with him in extensive detail he states understanding  Onychomycosis with pain  -Nails palliatively debrided as below. -Educated on self-care  Procedure: Nail Debridement Rationale: pain  Type of Debridement: manual, sharp debridement. Instrumentation: Nail nipper, rotary burr. Number of Nails: 10  Procedures and Treatment: Consent by patient was obtained for treatment procedures. The patient understood the discussion of treatment and procedures well. All questions were answered thoroughly reviewed. Debridement of mycotic and hypertrophic toenails, 1 through 5 bilateral and clearing of subungual debris. No ulceration, no infection noted.  Return Visit-Office Procedure: Patient instructed to return to the office for a follow up visit 3 months for continued evaluation and treatment.  Boneta Lucks, DPM    No follow-ups on file.

## 2020-08-07 ENCOUNTER — Other Ambulatory Visit: Payer: Self-pay | Admitting: Physical Medicine and Rehabilitation

## 2020-09-06 ENCOUNTER — Encounter: Payer: Self-pay | Admitting: Neurology

## 2020-09-06 ENCOUNTER — Ambulatory Visit: Payer: Medicare Other | Admitting: Neurology

## 2020-09-06 VITALS — BP 104/71 | HR 78

## 2020-09-06 DIAGNOSIS — M4807 Spinal stenosis, lumbosacral region: Secondary | ICD-10-CM | POA: Diagnosis not present

## 2020-09-06 DIAGNOSIS — G629 Polyneuropathy, unspecified: Secondary | ICD-10-CM

## 2020-09-06 DIAGNOSIS — G4731 Primary central sleep apnea: Secondary | ICD-10-CM

## 2020-09-06 DIAGNOSIS — M5412 Radiculopathy, cervical region: Secondary | ICD-10-CM

## 2020-09-06 DIAGNOSIS — G253 Myoclonus: Secondary | ICD-10-CM

## 2020-09-06 DIAGNOSIS — M5416 Radiculopathy, lumbar region: Secondary | ICD-10-CM

## 2020-09-06 DIAGNOSIS — G959 Disease of spinal cord, unspecified: Secondary | ICD-10-CM

## 2020-09-06 DIAGNOSIS — M48061 Spinal stenosis, lumbar region without neurogenic claudication: Secondary | ICD-10-CM

## 2020-09-06 MED ORDER — GABAPENTIN 300 MG PO CAPS: 300.0000 mg | ORAL_CAPSULE | Freq: Every day | ORAL | 1 refills | Status: DC

## 2020-09-06 NOTE — Progress Notes (Signed)
[facility]   Provider:  Larey Seat, MD   Referring Provider: [name], MD   Primary Care Physician:  [name], MD    09-06-2020: RV with Mrs and Mrs. Saulnier here today, patient is now wheelchair bound, has a  permanant urinary catheter, He is a CPAP user- compliance visit here today: he tested positive for COVID in late Ayugust, and had to be admitted due to low oxygen levels, with COPD overlap, monoclonal Ab treatments at [facility]. He was vaccinated twice in 07-2019, was infected by a caregiver from an agency - and had breakthrough.    [name]  is now under hospice care- and hospice care is going to assist with getting him a new machine, [employer].  He wants to know if hospice takes that over isfthere need to continue following here.  They are also asking why he is on the carbidopa- levodopa  Twice a day which was initially started here. He is taking baclofen  4 times a day which has best controlled his leg movements- break through spasms.   He has a lot of severe pain on top of his shoulders. Skin burning or knife stabbing sensation. Lidocaine patches and hemp have not helped.  Gabapentin made him too sleepy, as did Valium , too.  He can continue baclofen to treat the spasms in lower extremity. , 4 times a day- this is prescribed by rehab provider- she can make a descision about increasing the dose.  Can he get a booster shot through the [place].    Mr. Lough is a very compliant CPAP user or in his case auto CPAP user he has used the machine 20 out of 30 days and 27 of these days were over 4 hours consecutively which means a 90% compliance.  Average user time is 6 hours 35 minutes.  He is using a minimum pressure of 12 for maximum pressure of 20 cmH2O with 1 cm EPR and the treatment of OSA overlapping with COPD he does have some central and obstructive apneas residual residually but his main category unknown events 9.0/h  these are clearly related to air leakage the 95th percentile air leak is 82 L/min the pressure at the 95th percentile is 13 cmH2O his AHI residual is 10.8 again air leak related.  So I do not need to adjust anything in his machine I just wish we would find a mask that served some comfortably without causing air leakage.   02-05-2020,  I am meeting today with [name] , a meanwhile 80 year old patient of our sleep clinic. Our last conversation was on June 8th 2020 by virtual visit.  Mr. Remo his wife injured about a fractured both of her femurs in June so both Mr. and Mrs. Oravec are now in need of home health care and attention.  Over the year 2018 Mr. 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  He also had been previously followed here already for complex sleep apnea syndrome.  He had myoclonic jerks and spasms that did not qualify anymore as restless legs but are much more severe than that.  He also has reported difficulties with controlling bladder and bowel and he now has a catheter.  He presents today in a wheelchair.  I would like to  reiterate that in 2019 the patient was diagnosed with a cervical spine stenosis with anterior posterior diameter reduction to 5 mm causing spinal cord compression and myelopathic signal changes.  Severe bilateral foraminal narrowing.  Surgical decompression was performed by Dr. Saintclair Halsted at the time.  He is now 18 months almost 20 months since his neurosurgical intervention he has resided for a while in a rehab facility.  In March 2021 he was readmitted to hospital for uncontrollable involuntary movements of the lower extremities he developed a bedsore in March followed by pneumonia and stayed a total of 8 weeks in the hospital.  Dr. Deatra Ina then performed a lumbar laminectomy  L3-L4.  Myelopathy and severe stenosis had been found on the myelographic study.  It was at least of hope that it would help the patient to regain strength in his lower extremities and maybe even continence..  This has so far not been the case.  He was discharged from rehab where he stayed from 24 May to 25 November 2019.  His legs feel asleep all the time, he can't stand - certainly can't walk. At home PT has quit. Has  Bedsore.   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  Also neurogenic bowel and bladder, chronic diastolic congestive heart failure, chronic respiratory failure.      HPI: I had the pleasure of meeting today with Clemencia Course, MD 80 year old gentleman seen on 20 May 2018 after a 10-monthhiatus. In my last visit the patient has reported difficulties with controlling his bladder and bowel, he had become very weak both lower extremities were affected but also he was leaning towards his right side and felt that there was weakness of the right upper extremity and torso.  I obtained first an MRI of the brain with and without contrast which did not show an onset of, there was moderate to moderate generalized cortical atrophy and 1 minimal chronic microvascular changes overall healthy for age.  Dr. SMariea Stablethen followed with a cervical spine MRI which showed severe spinal stenosis with an anterior posterior diameter of 5 mm, causing spinal cord compression and myelopathic signal changes within the spinal cord.  There were severe bilateral foraminal narrowing noticed especially at C4 there was more moderate stenosis between C4-5, again severe stenosis at C5 and C6, and again C6 and C7 had only mild spinal stenosis.  The patient was immediately referred for surgical decompression with Dr. CSaintclair Halstedneurosurgery.  There were also multilevel degenerative changes at the lower spine but none of this was comparable to the cervical  spine findings.  He is now 7 month post surgical intervention and has had PT, rehab.  He is not in pain, has less myoclonic jerks.    I have followed Mr. JAQIL GOETTINGmainly for a sleep disorder and he has been 100% compliant CPAP user using an AutoSet between 12 and 20 cmH2O was 2 cm EPR, he has a lots of air leaks which causes erroneously high apnea problems.  He also struggles with bronchitis and a productive cough and has a nasal septal deviation that makes it harder to breathe through the nose.  He endorsed today the fatigue severity scale at 50, the Epworth Sleepiness Scale at 20 out of 24 points and the geriatric depression scale at 7 out of 15 points. He has a bruised nasal bridge which comes as a result from wearing the mask too tightly.  DEUNTAE KOCSIS is a 80 y.o. male is seen here today, 07-12-2017 , for intermittent leg jerking movements as well as irregular loss of muscle tone and control of the lower extremities.  His knees may buckle without warning.  Other times he was able to walk.  He is currently residing in a nursing facility and the goal is to get him home, but his rehab has been hampered by the unpredictable exercise tolerance and participation. Around Christmas time he suffered a urinary tract infection which set him back quite a bit, on January 2 he was admitted to hospital he required IV antibiotics, on the fifth he was discharged from hospital to a rehab-nursing home, where he still stays.  While his admission diagnosis was a urosepsis-urinary tract infection he will now need a new diagnosis to continue with rehab.  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  He had 2 replacement of the left knee with in a month, dating back to the year 2012 be relieved.  There has been no surgery to the right leg neither hip nor knee have been no back surgeries or spinal interventions. The myoclonic jerks are much more dominantly expressed with the right lower extremity, but his knees  buckle on both sides but not necessarily at the same time. His feet  don't lift well of the ground, especially the right.     01 May 2017.  He has again been a very compliant CPAP use of his 100% of the last 30 days daily use and 80% by hours.  Average use at times 4 hours 42 minutes, the patient uses an AutoSet between 12 and 18 cmH2O was wanted me to EPR is a residual AHI of 11.1.  The vast majority of these residual apneas are obstructive in nature usually means that he does need a little bit more pressure.  The 95th percentile pressure was 17.5 cm water.  I would like to increase the maximum pressure to 20 cm was 2 cm EPR.  He will stay on the memory foam lined mask the so-called air touch interface.  He likes it and it has reduced air leaks significantly.  He reports that his spouse still feels that there is too much air leak however it has not woken him up she is waking him up.  His fatigue severity score today was endorsed to 22 points but his Epworth sleepiness score is still high at 15 points, the geriatric depression score was endorsed at only 1 out of 15 points. He is happy with his results, more alertness.    10-30-2016, Mr. Meldrum has been, as usual, and excellent compliant patient. He has used CPAP was 90% compliance and an average of 5 hours and 90 minutes daily, uses an AutoSet between 12 and 18 cm water was 1 cm EPR and hasn't residual apnea index of 5.8. This is a little higher than I like it's but I think it is related to higher air leaks. The 95th percentile pressure was 17.1, which is an unusual high pressure needs. The air leaks were remarkably high. The patient sometimes is aware at night that there is an air leak but not every night I would like for him to be refitted to a different interface. I would like him to try an air touch fullface mask, which has no resting point on the forehead. He also endorsed 16 points on his Epworth Sleepiness Scale which is too high, and 43 points on  the fatigue. His geriatric  depression score on the other hand was only endorsed at one out of 15 points. He continues to use multiple medications including ropinirole 1 mg in the morning and 4 mg at night for restless leg control. Carbidopa levodopa2 times a day for RLS, he also takes metoprolol, levo-thyroxin,  Rizatriptan,  and allopurinol. Various over-the-counter medication-nutritional supplements. RLS goes away after sinemet intake. 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  Revisit note from 07/01/2015. I me today with Mr. Evitts, who has an excellent compliance on his CPAP  S 9 machine. We interrogated the machine by wife 5 today here in the office and it revealed that his AHI is 6.7 with 100% compliance for days of use and an average user time of 6 hours and 6 minutes. He is using an AutoSet between 12 and 18 cm water pressure was 1 cm EPR the 95th percentile pressure is 16. He does have sometimes significant air leaks that the mask leak he is using a full face mask as evident by the pressure marks at least on his face.he cannot tolerate nasal airflow.   He has bronchitic breathing and has been short of air while talking.  His gout remains well controlled, but her mentioned how expensive his medications are , needed to achieve this control.  He reports restless legs and filled the Johns-Hopkins Questionnaire for quality of life in RLS.  The patient has been long time established, and  underwent a split study in 2011 at [place]. His AHI was 77 per hour and he was titrated to 16 cm water pressure with a 3 cm EPR. The residual AHI became lower than 5. A download reorder from August 2013 showed 100% compliance. I was unable today to obtain the data for 2014 but asked Mr. Gorter 'es DME - [facility] here in [place]) to provide [place] with the most recent data. He got this download per [name] today.  Today the download shows been 96% compliance over the last 90 days, average time of CPAP use per day  at 6 hours and 52 minutes, residual AHI is 6.9 and the sac pressure is 16 cm water with 3 cm EPR there is a moderately high air leak, the pressure is so high his mask blows off his face. He needs a new mask and headgear soon.  I would like for him to have an auto-titrator machine with a setting from 8 through 16 cm water . He states he never sleeps without his CPAP and likes the machines effect on his sleep, rest, agility.  He has 2- 3 nocturia breaks interrupting his sleep. Bedtime is between 23 hours and midnight. Subjective getting 7-8 hours of sleep , wakes up spontaneously at 7 AM.         Review of Systems: Out of a complete 14 system review, the patient complains of only the following symptoms, and all other reviewed systems are negative.  SOB, fatigue reduced. GDS one points,  How likely are you to doze in the following situations: 0 = not likely, 1 = slight chance, 2 = moderate chance, 3 = high chance  Sitting and Reading? 2 Watching Television?3 Sitting inactive in a public place (theater or meeting)?2 Lying down in the afternoon when circumstances permit?2 Sitting and talking to someone?1 Sitting quietly after lunch without alcohol?1 In a car, while stopped for a few minutes in traffic?0 As a passenger in a car for an hour without a break?1  He adjusted his diet ,  his wife no longer cooks fried foods, more whole wheat , salads.  His son is a Child psychotherapist and supplies the couple with whole wheat .  Daughter and son in law provide in home care.  Breakfast with caffeine , 2-3 cups of coffee. Non smoker,  Non drinker.      Social History   Socioeconomic History  . Marital status: Married    Spouse name: [name]  . Number of children: 2  . Years of education: COLLEGE  . Highest education level: Not on file  Occupational History    Employer: RETIRED  Tobacco Use  . Smoking status: Former Smoker    Years: 40.00    Types: Cigars, Cigarettes    Quit date: 06/19/2005    Years since  quitting: 15.2  . Smokeless tobacco: Never Used  . Tobacco comment: smoked 4-6 cigars daily, only smoked cigarettes socially  Vaping Use  . Vaping Use: Never used  Substance and Sexual Activity  . Alcohol use: No  . Drug use: No  . Sexual activity: Not on file  Other Topics Concern  . Not on file  Social History Narrative   Patient is married [name]) and lives at home with his wife.   Patient has two children.   Patient is retired.   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   Patient is right-handed.   Patient drinks three cups of coffee per day.   Social Determinants of Health   Financial Resource Strain: Not on file  Food Insecurity: Not on file  Transportation Needs: Not on file  Physical Activity: Not on file  Stress: Not on file  Social Connections: Not on file  Intimate Partner Violence: Not on file    Family History  Problem Relation Age of Onset  . COPD Mother   . Hyperlipidemia Mother   . Hypertension Mother   . Diabetes Father   . Kidney disease Father        ESRD  . Hypertension Father   . Cancer Father   . Hyperlipidemia Father     Past Medical History:  Diagnosis Date  . Arthritis   . Barrett esophagus   . Bronchitis   . Carotid artery occlusion   . COPD (chronic obstructive pulmonary disease) ([place])   . Diabetes mellitus without complication ([place])   . GERD (gastroesophageal reflux disease)   . Headache    migraines  . History of thyroid cancer 2008  . Hypertension   . Restless leg   . Sleep apnea with use of continuous positive airway pressure (CPAP)    2011 [facility] , AHI  77cn central and obstrcutive. 16 cm water , 3 cm EPR.   . Thoracic ascending aortic aneurysm (H[facility])    4.2 cm ascending TAA 08/18/17 CTA. Annual imaging recommended.  . thyroid ca dx'd 2009 or 2010   surg and radioactive isoptope  . Ulcer    Peptic ulcer disease    Past Surgical History:  Procedure Laterality Date  . ANTERIOR CERVICAL  DECOMP/DISCECTOMY FUSION N/A 09/03/2017   Procedure: ACDF - C3-C4 - C4-C5 - C5-C6;  Surgeon: Kary Kos, MD;  Location: [facility];  Service: Neurosurgery;  Laterality: N/A;  . CAROTID ENDARTERECTOMY Left January 03, 2006   Dr. Amedeo Plenty  . COLONOSCOPY    . EYE SURGERY Bilateral    cataract surgery with lens implants  . game keepers thumb Right   . JOINT REPLACEMENT Left    knee X 2  .  LUMBAR LAMINECTOMY/ DECOMPRESSION WITH MET-RX N/A 11/04/2019   Procedure: LUMBAR THREE-FOUR LUMBAR LAMINECTOMY/ DECOMPRESSION WITH MET-RX;  Surgeon: Judith Part, MD;  Location: [facility];  Service: Neurosurgery;  Laterality: N/A;  . RADIOLOGY WITH ANESTHESIA N/A 10/16/2019   Procedure: MRI THORACIC LUMBAR SPINE;  Surgeon: Radiologist, Medication, MD;  Location: [facility];  Service: Radiology;  Laterality: N/A;  . ROTATOR CUFF REPAIR Right   . THYROIDECTOMY  2008    Current Outpatient Medications  Medication Sig Dispense Refill  . baclofen (LIORESAL) 20 MG tablet TAKE 2 TABLETS BY MOUTH 4 TIMES DAILY FOR SPASTICITY 240 tablet 1  . HYDROcodone-acetaminophen (NORCO/VICODIN) 5-325 MG tablet TAKE 1 TABLET BY MOUTH EVERY 6 HOURS AS NEEDED FOR MODERATE PAIN OR SEVERE PAIN. 120 tablet 0  . metoprolol succinate (TOPROL-XL) 25 MG 24 hr tablet Take 1 tablet (25 mg total) by mouth daily. 30 tablet 1  . acetaminophen (TYLENOL) 325 MG tablet Take 2 tablets (650 mg total) by mouth every 6 (six) hours as needed (pain or temperature).    [name] albuterol (VENTOLIN HFA) 108 (90 Base) MCG/ACT inhaler Inhale 1 puff into the lungs every 4 (four) hours as needed for wheezing or shortness of breath. 6.7 g 1  . aspirin EC 325 MG tablet Take 325 mg by mouth daily.    [name] atorvastatin (LIPITOR) 20 MG tablet Take 1 tablet (20 mg total) by mouth daily at 6 PM. 30 tablet 0  . calcium citrate (CALCITRATE - DOSED IN MG ELEMENTAL CALCIUM) 950 (200 Ca) MG tablet Take 1 tablet (200 mg of elemental calcium total) by mouth daily. 30 tablet 0  . carbidopa-levodopa  (SINEMET IR) 10-100 MG tablet Take 1 tablet by mouth 2 (two) times daily.    . cholecalciferol (VITAMIN D) 25 MCG (1000 UNIT) tablet Take 1 tablet (1,000 Units total) by mouth daily. 30 tablet 0  . furosemide (LASIX) 40 MG tablet TAKE 1 TABLET BY MOUTH DAILY. 90 tablet 3  . gabapentin (NEURONTIN) 300 MG capsule Take 300 mg by mouth at bedtime.     [name] guaiFENesin (MUCINEX) 600 MG 12 hr tablet Take 2 tablets (1,200 mg total) by mouth 2 (two) times daily. 120 tablet 1  . latanoprost (XALATAN) 0.005 % ophthalmic solution Place 1 drop into both eyes at bedtime. 2.5 mL 12  . levothyroxine (SYNTHROID) 175 MCG tablet Take 175 mcg by mouth daily.    . methocarbamol (ROBAXIN) 750 MG tablet Take 1 tablet (750 mg total) by mouth every 6 (six) hours as needed for muscle spasms. 90 tablet 0  . Multiple Vitamin (MULTIVITAMIN) tablet Take 1 tablet by mouth daily.    . nitroGLYCERIN (NITROSTAT) 0.4 MG SL tablet Place 0.4 mg every 5 (five) minutes as needed under the tongue for chest pain.    [name] ondansetron (ZOFRAN) 4 MG tablet Take 1 tablet (4 mg total) by mouth every 6 (six) hours as needed for nausea. 20 tablet 0  . pantoprazole (PROTONIX) 40 MG tablet Take 1 tablet (40 mg total) by mouth every evening. (Patient taking differently: Take 40 mg by mouth daily as needed (indigestion). ) 30 tablet 0  . polyethylene glycol powder (MIRALAX) 17 GM/SCOOP powder Take 0.5 Containers by mouth daily. 17 gm every other day     . rizatriptan (MAXALT) 10 MG tablet Take 10 mg by mouth daily as needed for migraine.     . senna-docusate (SENOKOT-S) 8.6-50 MG tablet Take 2 tablets by mouth 2 (two) times daily. (Patient taking differently: Take 2  tablets by mouth at bedtime. )    . TRELEGY ELLIPTA 100-62.5-25 MCG/INH AEPB Inhale 1 puff into the lungs daily.   4   No current facility-administered medications for this visit.    Allergies as of 09/06/2020  . (No Known Allergies)   Needle examination of right lower extremity vastus  medialis, tibials anterior, gastrocnemius is normal except: 1+ fibrillation potentials in right tibialis anterior at rest and decreased motor unit recruitment on exertion.  IMPRESSION:  Abnormal study demonstrating: 1. Length-dependent, axonal sensorimotor polyneuropathy. 2. Evidence of lumbar radiculopathies based upon abnormal tibial F-wave latencies.   INTERPRETING PHYSICIAN:  Penni Bombard, MD  STUDY DATE: 07/26/17   Vitals:  5'7" and weight presumed at 175 pounds/  BP 104/71   Pulse 78  Last Weight:  Wt Readings from Last 1 Encounters:  04/05/20 171 lb (77.6 kg)   Last Height:   Ht Readings from Last 1 Encounters:  04/05/20 '5\' 7"'  (1.702 m)   Mental Status: Alert, oriented, thought content appropriate.  Speech fluent without evidence of aphasia. Able to follow 3 step commands without difficulty. Cranial Nerves: loss of taste and smell with COVID in August-  Has partially recovered.   Left ptosis, regular pupils, normal EOM.  Motor: no control over hip flexors, he provides traces of adductor activity , not in abductors.when  He is cleaned he has often hip adductor spasms and kick.  Both knees have limited ROM, and the right knee hurt. He has pain in the right heel and calf. He has lost further mus cle mass. Rigor. No involuntary movements seen here- he reports he can kick suddenly without control.  Spasms.    Sensory: Pinprick felt throughout. Loss of vibration in both lower extremities for the knee down.  Absent in the right foot completely. In the left some sensation of vibration on the medial ankle.  Deep Tendon Reflexes: absent , not event traces in lower extremities, attenuated in both upper extremities.  Plantars: unresponsive bilaterally Cerebellar: Normal finger-to-nose, normal rapid alternating movements.   Plan :   D/c Sinemet - carbidopa/ levodopa- has not inflenced his PLMs, or neuropathic pain or cramping related to spinal stenosis.   HST ordered for  this patient who is home bound.  hospice will provide a new auto-CPAP and supplies from now on, and he had low Oxygen levels, 86% and below.  Added oxygen to CPAP at 5 liters all day and night - COPD/ OSA/ COVID , added by Hospice.  RV in 12 month VIDEO Visit with hospice RN present.   He tried a FFM and likes it-     Larey Seat, MD

## 2020-09-10 ENCOUNTER — Encounter: Payer: Self-pay | Admitting: Physical Medicine and Rehabilitation

## 2020-09-10 ENCOUNTER — Encounter
Payer: Medicare Other | Attending: Physical Medicine and Rehabilitation | Admitting: Physical Medicine and Rehabilitation

## 2020-09-10 ENCOUNTER — Other Ambulatory Visit: Payer: Self-pay

## 2020-09-10 VITALS — BP 107/66 | HR 70 | Temp 98.5°F | Ht 67.0 in

## 2020-09-10 DIAGNOSIS — Z978 Presence of other specified devices: Secondary | ICD-10-CM | POA: Diagnosis present

## 2020-09-10 DIAGNOSIS — G8220 Paraplegia, unspecified: Secondary | ICD-10-CM

## 2020-09-10 DIAGNOSIS — G5690 Unspecified mononeuropathy of unspecified upper limb: Secondary | ICD-10-CM | POA: Diagnosis present

## 2020-09-10 DIAGNOSIS — N319 Neuromuscular dysfunction of bladder, unspecified: Secondary | ICD-10-CM

## 2020-09-10 DIAGNOSIS — K592 Neurogenic bowel, not elsewhere classified: Secondary | ICD-10-CM

## 2020-09-10 DIAGNOSIS — R252 Cramp and spasm: Secondary | ICD-10-CM

## 2020-09-10 MED ORDER — DANTROLENE SODIUM 50 MG PO CAPS: 50.0000 mg | ORAL_CAPSULE | Freq: Every day | ORAL | 5 refills | Status: DC

## 2020-09-10 NOTE — Progress Notes (Signed)
Subjective:    Patient ID: [name], male    DOB: 1941-02-07, 80 y.o.   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    Got COIVD in 9/21- both got it- got home 03/22/20- both went to [facility]. Got antibodies earlier than wife- wife got it pretty bad- both got close to being put on ventilator. Wife's resistance was low due to B/L femur fx's but pt has COPD.   Just got booster- wife can't get yet- had tested COVID (+) again in December.   Spasticity is worse- Baclofen helps some Shoulders are hypersensitive- can brush shoulder- and hurts really bad- not joint pain. R side, spasms can get real bad at times- most of time, pulls him down in bed. Ends up a little ball in center of bed.     Still takes Gabapentin 300 mg QHS- when tried to use 100 mg 3x/day, didn't help, but sedation worse.  Tried hemp cream on it- no improvement.  Valium 1/2 dose made him too sleepy- gorked.  Lidocaine patch didn't work at all.   Pressure ulcer is GONE- healed up. Has also had one on heel and foot- wrapped with gauze on feet/ankles- works to PREVENT ulcers.    Prevalon boots cause more skin breakdown.    Takes Baclofen 40 mg QID Also takes Robaxin-  750 mg 4x/day WITH Baclofen.   Still takes Norco- as needed- at most 1x/day. Hospice is taking care paying for it.    Got power w/c- [employer] got them hardship financing - so can get w/c- which is awesome! But took a lot to get him to this point.   Has sediment in foley- checked by Hospice-  Doesn't get clogged  Social hx: On hospice/pallaitve care- out to his home 3x/week. For COPD.      Pain Inventory Average Pain 6 Pain Right Now 2 My pain is intermittent and sharp  LOCATION OF PAIN  Left shoulder & Right shoulder  BOWEL 2 Oral laxative use Yes  Type of laxative -  Miralax Enema or suppository use No  History of colostomy No  Incontinent No   BLADDER Foley In and out cath, frequency N/A Able to self cath n/a Bladder incontinence Foley Frequent urination Foley Leakage with coughing N/A Difficulty starting stream N/A Incomplete bladder emptying N/A   Mobility use a wheelchair needs help with transfers  Function retired  Neuro/Psych bowel control problems weakness numbness trouble walking spasms loss of taste or smell  Prior Studies Any changes since last visit?  yes x-rays Lung Xray at [place] involved in your care Any changes since last visit?  no   Family History  Problem Relation Age of Onset  . COPD Mother   . Hyperlipidemia Mother   . Hypertension Mother   . Diabetes Father   . Kidney disease Father        ESRD  . Hypertension Father   . Cancer Father   . Hyperlipidemia Father    Social History   Socioeconomic History  . Marital status: Married    Spouse name: [name]  . Number of children: 2  . Years of education: COLLEGE  . Highest education level: Not on file  Occupational History    Employer: RETIRED  Tobacco Use  . Smoking status: Former Smoker  Years: 40.00    Types: Cigars, Cigarettes    Quit date: 06/19/2005    Years since quitting: 15.2  . Smokeless tobacco: Never Used  . Tobacco comment: smoked 4-6 cigars daily, only smoked cigarettes socially  Vaping Use  . Vaping Use: Never used  Substance and Sexual Activity  . Alcohol use: No  . Drug use: No  . Sexual activity: Not on file  Other Topics Concern  . Not on file  Social History Narrative   Patient is married [name]) and lives at home with his wife.   Patient has two children.   Patient is retired.   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   Patient is right-handed.   Patient drinks three cups of coffee per day.   Social Determinants of Health   Financial Resource Strain: Not on file  Food Insecurity:  Not on file  Transportation Needs: Not on file  Physical Activity: Not on file  Stress: Not on file  Social Connections: Not on file   Past Surgical History:  Procedure Laterality Date  . ANTERIOR CERVICAL DECOMP/DISCECTOMY FUSION N/A 09/03/2017   Procedure: ACDF - C3-C4 - C4-C5 - C5-C6;  Surgeon: Kary Kos, MD;  Location: [place];  Service: Neurosurgery;  Laterality: N/A;  . CAROTID ENDARTERECTOMY Left January 03, 2006   Dr. Amedeo Plenty  . COLONOSCOPY    . EYE SURGERY Bilateral    cataract surgery with lens implants  . game keepers thumb Right   . JOINT REPLACEMENT Left    knee X 2  . LUMBAR LAMINECTOMY/ DECOMPRESSION WITH MET-RX N/A 11/04/2019   Procedure: LUMBAR THREE-FOUR LUMBAR LAMINECTOMY/ DECOMPRESSION WITH MET-RX;  Surgeon: Judith Part, MD;  Location: [place];  Service: Neurosurgery;  Laterality: N/A;  . RADIOLOGY WITH ANESTHESIA N/A 10/16/2019   Procedure: MRI THORACIC LUMBAR SPINE;  Surgeon: Radiologist, Medication, MD;  Location: [place];  Service: Radiology;  Laterality: N/A;  . ROTATOR CUFF REPAIR Right   . THYROIDECTOMY  2008   Past Medical History:  Diagnosis Date  . Arthritis   . Barrett esophagus   . Bronchitis   . Carotid artery occlusion   . COPD (chronic obstructive pulmonary disease) ([place])   . Diabetes mellitus without complication ([place])   . GERD (gastroesophageal reflux disease)   . Headache    migraines  . History of thyroid cancer 2008  . Hypertension   . Restless leg   . Sleep apnea with use of continuous positive airway pressure (CPAP)    [facility] , [facility]  77cn central and obstrcutive. 16 cm water , 3 cm EPR.   . Thoracic ascending aortic aneurysm ([facility])    4.2 cm ascending TAA 08/18/17 CTA. Annual imaging recommended.  . thyroid ca dx'd 2009 or 2010   surg and radioactive isoptope  . Ulcer    Peptic ulcer disease   BP 107/66   Pulse 70   Temp 98.5 F (36.9 C)   Ht _0  (1.702 m)   SpO2 (!) 87% Comment: On 5 liters of 02 at home  BMI  26.78 kg/m   Opioid Risk Score:   Fall Risk Score:  `1  Depression screen PHQ 2/9  Depression screen PHQ 2/9 12/08/2019  Decreased Interest 0  Down, Depressed, Hopeless 0  PHQ - 2 Score 0  Altered sleeping 2  Tired, decreased energy 3  Change in appetite 0  Feeling bad or failure about yourself  2  Trouble concentrating 0  Moving slowly or fidgety/restless 0  Suicidal thoughts 0  PHQ-9 Score 7  Some recent data might be hidden   Review of Systems  Respiratory: Positive for cough.   Gastrointestinal: Positive for constipation.  Genitourinary:       Foley  Musculoskeletal:       Spasms  Neurological: Positive for weakness. Negative for numbness.  All other systems reviewed and are negative.      Objective:   Physical Exam  Awake, alert, , but sleepy- in power w/c , accompanied bu wife, NAD Sediment in foley- light maber urine Wearing PRAFOs B/L- wearing shorts Neuro: MAS of 3 in LLE- with extensor tone with ranged MAS of 4 in RLE- also went into extensor tone with ROM.  Few beats clonus on B/L LEs. Went into spasms AFTER ROM- 2-3 minutes afterwards.   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   Here for f/u on SCI as above.   1. For nerve pain-  Try CBD oil drops- topical or on tongue- shouldn't interfere with current medications.   2. If you try and doesn't work, then try Duloxetine/Cymbalta- 20 mg QHS  X 1 week, then 40 mg nightly x 1 week, then 60 mg nightly- for nerve pain.   3. Baclofen 40 mg 4x/day- for spasticity.   4. Try to change Methocarbemol to 750 mg 4x/day AS NEEDED- and then can wean if able to- if cannot, that's fine. If can wean- great, can improve sleepiness.   5. Doesn't need a refill on Norco 5/325 mg q6 hours prn- I  can refill when needs- has 60 tabs left.   6. Can try Dantrolene-  (no liver issues) Dantrolene  Side effects- loose stools, sedation-   Start 50 mg nightly x 1 week Then 50 mg 2x/day x 1 week Then 50 mg 3x/day x 1 week Then 100 mg 2x/day or 50 mg 4x/day-  Needs CMP/LFTs/labs checked in 1 month, 3 months and every 6 months while on medication- won't notice an improvement until end of 2nd/beginiing of 3rd week.   7. Starting the third week, if spasticity is better controlled, can reduce Baclofen to 30 mg 4x/day- so would give 1.5 tabs 4x/day- that would leave 10 mg 4x/day AS NEEDED to play with if needed.   8. Wait on Sediment protocol since hasn't gotten clogged lately.   9. Not candidate for ITB pump due to trying other meds- has had too many side effects with Zanaflex/Tizandiine- cannot use. Also too sedated with Valium  10. F/U in 3 months- double appointment    I spent a total of 35 minutes on visit- as detailed above- esp spent a long time going over options for spasticity treatment and nerve pain meds.

## 2020-09-10 NOTE — Patient Instructions (Signed)
  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   Here for f/u on SCI as above.   1. For nerve pain-  Try CBD oil drops- topical or on tongue- shouldn't interfere with current medications.   2. If you try and doesn't work, then try Duloxetine/Cymbalta- 20 mg QHS  X 1 week, then 40 mg nightly x 1 week, then 60 mg nightly- for nerve pain.   3. Baclofen 40 mg 4x/day- for spasticity.   4. Try to change Methocarbemol to 750 mg 4x/day AS NEEDED- and then can wean if able to- if cannot, that's fine. If can wean- great, can improve sleepiness.   5. Doesn't need a refill on Norco 5/325 mg q6 hours prn- I can refill when needs- has 60 tabs left.   6. Can try Dantrolene-  (no liver issues) Dantrolene  Side effects- loose stools, sedation-   Start 50 mg nightly x 1 week Then 50 mg 2x/day x 1 week Then 50 mg 3x/day x 1 week Then 100 mg 2x/day or 50 mg 4x/day-  Needs CMP/LFTs/labs checked in 1 month, 3 months and every 6 months while on medication- won't notice an improvement until end of 2nd/beginiing of 3rd week.   7. Starting the third week, if spasticity is better controlled, can reduce Baclofen to 30 mg 4x/day- so would give 1.5 tabs 4x/day- that would leave 10 mg 4x/day AS NEEDED to play with if needed.   8. Wait on Sediment protocol since hasn't gotten clogged lately.   9. Not candidate for ITB pump due to trying other meds- has had too many side effects with Zanaflex/Tizandiine- cannot use. Also too sedated with Valium  10. F/U in 3 months- double appointment

## 2020-10-14 ENCOUNTER — Other Ambulatory Visit: Payer: Self-pay | Admitting: Physical Medicine and Rehabilitation

## 2020-10-14 ENCOUNTER — Telehealth: Payer: Self-pay

## 2020-10-14 NOTE — Telephone Encounter (Signed)
Per [name]: Mr. Cesar cannot take the Dantrium 50 mg. When taking the Rx he sleeps all day.    Please advise.  Call back ph [phone].

## 2020-10-15 MED ORDER — BACLOFEN 20 MG PO TABS: 40.0000 mg | ORAL_TABLET | Freq: Four times a day (QID) | ORAL | 5 refills | Status: DC

## 2020-10-15 NOTE — Telephone Encounter (Signed)
  Goes to sleep the whole day with Dantrolene.   Was also sick, so that's why waited so long to call.  Only took Dantrolene 50 mg QHS. And slept an entire day.   Backed off on Robaxin- taking 2x/day- Gets gabapentin at night- by morning, needs Robaxin.  Also 1 in afternoon.   Baclofen- does great on it.  Will send in refill for baclofen 40 mg QID with 5 refills

## 2020-11-11 ENCOUNTER — Other Ambulatory Visit: Payer: Self-pay | Admitting: Physical Medicine and Rehabilitation

## 2020-11-12 ENCOUNTER — Other Ambulatory Visit: Payer: Self-pay | Admitting: Physical Medicine and Rehabilitation

## 2020-11-12 ENCOUNTER — Telehealth: Payer: Self-pay | Admitting: *Deleted

## 2020-11-12 MED ORDER — HYDROCODONE-ACETAMINOPHEN 5-325 MG PO TABS: 1.0000 | ORAL_TABLET | Freq: Four times a day (QID) | ORAL | 0 refills | Status: DC | PRN

## 2020-11-12 NOTE — Telephone Encounter (Signed)
Mrs Leffel has called stating it is their 2nd request to get his hydrocodone 5/325 #120 refilled.  By the PMP it was last filled 03/26/21 by Dr Dagoberto Ligas.  There is also a refill request already in Dr Florentina Jenny inbox from the pharmacy for this medication.

## 2020-11-17 ENCOUNTER — Telehealth: Payer: Self-pay

## 2020-11-17 NOTE — Telephone Encounter (Signed)
Hydrocodone/APAP 5/325 mg denied. It has been appealed. I spoke with the wife of the patient and she states for the time being they will pay cash for the medication.

## 2020-12-01 NOTE — Telephone Encounter (Signed)
Appeal approved for 11/12/20 through 06/18/21

## 2020-12-29 ENCOUNTER — Encounter: Payer: Medicare Other | Admitting: Physical Medicine and Rehabilitation

## 2020-12-31 ENCOUNTER — Encounter
Payer: Medicare Other | Attending: Physical Medicine and Rehabilitation | Admitting: Physical Medicine and Rehabilitation

## 2020-12-31 ENCOUNTER — Other Ambulatory Visit: Payer: Self-pay

## 2020-12-31 ENCOUNTER — Encounter: Payer: Self-pay | Admitting: Physical Medicine and Rehabilitation

## 2020-12-31 VITALS — BP 93/60 | HR 71 | Temp 98.7°F | Ht 67.0 in

## 2020-12-31 DIAGNOSIS — Z66 Do not resuscitate: Secondary | ICD-10-CM | POA: Diagnosis present

## 2020-12-31 DIAGNOSIS — Z993 Dependence on wheelchair: Secondary | ICD-10-CM | POA: Diagnosis present

## 2020-12-31 DIAGNOSIS — R252 Cramp and spasm: Secondary | ICD-10-CM | POA: Diagnosis present

## 2020-12-31 DIAGNOSIS — G8220 Paraplegia, unspecified: Secondary | ICD-10-CM | POA: Diagnosis present

## 2020-12-31 DIAGNOSIS — Z515 Encounter for palliative care: Secondary | ICD-10-CM | POA: Diagnosis present

## 2020-12-31 DIAGNOSIS — M4807 Spinal stenosis, lumbosacral region: Secondary | ICD-10-CM | POA: Diagnosis present

## 2020-12-31 MED ORDER — METHOCARBAMOL 750 MG PO TABS: 750.0000 mg | ORAL_TABLET | Freq: Four times a day (QID) | ORAL | 5 refills | Status: DC

## 2020-12-31 NOTE — Patient Instructions (Signed)
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 On hospice- in October 2021.    Retry Duloxetine for nerve pain and mood-  Duloxetine/Cymbalta- 20 mg QHS  X 1 week, then 40 mg nightly x 1 week, then 60 mg nightly- for nerve pain. Transferred via hoyer sling- however I suggest trying to get pt to edge of bed- with "a little help"- by son/son in law- without tipping over- for 30-60 seconds- then, call me, and I'll order therapy- and work with [employer]- and might be able to get better? Gets pain meds from Hospice Baclofen- has refills Con't Robaxin/Methocarbemol 750 mg 4x/day- #120- 5 refills.  F/u in 3-4 months

## 2020-12-31 NOTE — Progress Notes (Signed)
Subjective:    Patient ID: [name], male    DOB: [birth year], 80 y.o.   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 On hospice- in October 2021.    Here for f/u on SCI.   Wife cannot get him OOB by herself- so he's in bed- In bed except for days he's going to doctor- or if kids come over to have dinner, etc- they will then get him out of bed.   Not dizzy right now.   Wounds are better- because in bed so much.  Hospice nurse keeping an eye on them- ankles have basically healed up completely. But keep wrapped due to spasms which cause him to break open wounds if they aren't wrapped.     Duloxetine - knocked him out. Was ill at the same time.   Dantrolene made him go to sleep the whole day.   Spasms - still on Baclofen 40 mg QID Tried without for 2 weeks off Robaxin/as needed, but got too painful/spasmy.  So went back on Robaxin 750 mg 4x/day with Baclofen.  Not sleepy on these meds.   Just taking Norco 7.5/325 mg- was increased by hospice to higher dose- mainly for L shoulder- something is helping- or sleeping more on R side.     Still has Foley.  Bowel- in a diaper and uses Miralax in AM and Senokot at night- is a little constipated, but is chronic for him.  Got blister on L leg- has bandage- has healed now- - and connector to maintain foley clipped to L leg.    CBD oil didn't help pain- because just used for mouth dryness.   Pain Inventory Average Pain 5 Pain Right Now 5 My pain is constant, sharp, burning, dull, and stabbing  LOCATION OF PAIN  ankles, shoulders, buttock  BOWEL Number of stools per week: 1  Oral laxative use Yes  Type of laxative Miralax, Senokot Enema or suppository use No  History of colostomy No  Incontinent No   BLADDER Foley In and out cath, frequency n/a Able to self cath No  Bladder  incontinence No  Frequent urination No  Leakage with coughing No  Difficulty starting stream No  Incomplete bladder emptying No    Mobility do you drive?  no use a wheelchair needs help with transfers Do you have any goals in this area?  yes  Function retired I need assistance with the following:  dressing, bathing, toileting, meal prep, household duties, and shopping Do you have any goals in this area?  yes  Neuro/Psych bladder control problems weakness numbness trouble walking spasms  Prior Studies Any changes since last visit?  yes CT/MRI June 2022 at [place] involved in your care Any changes since last visit?  yes At the [name]  Family History  Problem Relation Age of Onset   COPD Mother    Hyperlipidemia Mother    Hypertension Mother    Diabetes Father    Kidney disease Father        ESRD   Hypertension Father    Cancer Father    Hyperlipidemia Father    Social History   Socioeconomic History   Marital status: Married    Spouse name: [name]   Number of children: 2   Years of education: COLLEGE   Highest education level: Not on file  Occupational History    Employer: RETIRED  Tobacco Use   Smoking status: Former    Years: 40.00    Types: Cigars, Cigarettes    Quit date: 06/19/2005    Years since quitting: 15.5   Smokeless tobacco: Never   Tobacco comments:    smoked 4-6 cigars daily, only smoked cigarettes socially  Vaping Use   Vaping Use: Never used  Substance and Sexual Activity   Alcohol use: No   Drug use: No   Sexual activity: Not on file  Other Topics Concern   Not on file  Social History Narrative   Patient is married [name]) and lives at home with his wife.   Patient has two children.   Patient is retired.   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   Patient is right-handed.   Patient drinks three cups of coffee per day.   Social Determinants of Health   Financial Resource Strain: Not on file  Food  Insecurity: Not on file  Transportation Needs: Not on file  Physical Activity: Not on file  Stress: Not on file  Social Connections: Not on file   Past Surgical History:  Procedure Laterality Date   ANTERIOR CERVICAL DECOMP/DISCECTOMY FUSION N/A 09/03/2017   Procedure: ACDF - C3-C4 - C4-C5 - C5-C6;  Surgeon: Kary Kos, MD;  Location: [place];  Service: Neurosurgery;  Laterality: N/A;   CAROTID ENDARTERECTOMY Left January 03, 2006   Dr. Amedeo Plenty   COLONOSCOPY     EYE SURGERY Bilateral    cataract surgery with lens implants   game keepers thumb Right    JOINT REPLACEMENT Left    knee X 2   LUMBAR LAMINECTOMY/ DECOMPRESSION WITH MET-RX N/A 11/04/2019   Procedure: LUMBAR THREE-FOUR LUMBAR LAMINECTOMY/ DECOMPRESSION WITH MET-RX;  Surgeon: Judith Part, MD;  Location: [place];  Service: Neurosurgery;  Laterality: N/A;   RADIOLOGY WITH ANESTHESIA N/A 10/16/2019   Procedure: MRI THORACIC LUMBAR SPINE;  Surgeon: Radiologist, Medication, MD;  Location: [place];  Service: Radiology;  Laterality: N/A;   ROTATOR CUFF REPAIR Right    THYROIDECTOMY  2008   Past Medical History:  Diagnosis Date   Arthritis    Barrett esophagus    Bronchitis    Carotid artery occlusion    COPD (chronic obstructive pulmonary disease) ([facility])    Diabetes mellitus without complication ([facility])    GERD (gastroesophageal reflux disease)    Headache    migraines   History of thyroid cancer 2008   Hypertension    Restless leg    Sleep apnea with use of continuous positive airway pressure (CPAP)    [facility] , [facility]  77cn central and obstrcutive. 16 cm water , 3 cm EPR.    Thoracic ascending aortic aneurysm ([facility])    4.2 cm ascending TAA 08/18/17 CTA. Annual imaging recommended.   thyroid ca dx'd 2009 or 2010   surg and radioactive isoptope   Ulcer    Peptic ulcer disease   BP 93/60   Pulse 71   Temp 98.7 F (37.1 C)   Ht 5' 7" (1.702 m)   SpO2 91% Comment: pt use O2 at home.  BMI 26.78 kg/m   Opioid Risk  Score:   Fall Risk Score:  `1  Depression screen PHQ 2/9  Depression screen [facility] 2/9 09/10/2020 12/08/2019  Decreased Interest 0 0  Down, Depressed, Hopeless 0 0  PHQ - 2 Score 0 0  Altered sleeping - 2  Tired, decreased energy - 3  Change in appetite - 0  Feeling bad or failure about yourself  - 2  Trouble concentrating - 0  Moving slowly or fidgety/restless - 0  Suicidal thoughts - 0  PHQ-9 Score - 7  Some recent data might be hidden    Review of Systems  HENT:  Positive for congestion.   Respiratory:  Negative for cough.   Musculoskeletal:  Positive for gait problem.       Pain in shoulder and ankles  Skin:  Positive for wound.       Buttock wound, inner left thigh wound blister  All other systems reviewed and are negative.     Objective:   Physical Exam  Awake, alert, has lost weight- looks less robust, coughing a deep chest cough intermittently- accompanied by wife; in power w/c- with joystick on R; NAD Has foley- tube attached to L leg- banadage from previous blister on L upper leg Previous L TKR- scar notable Lacking 20-30 degrees of R knee extension and 5-10 degrees on L knee Forming contractures.  Spasms elicits with simple touch- esp RLE compared to LLE.  MAS of 2-3 in RLE- MAS of 1+ in LLE   MS:  LE's RLE- HF/KE/DF and PF 3+/5  LLE-  same muscles 4-/5 Both ankles wrapped to prevent ankle wounds.  Wearing shoes      Assessment & Plan:  Pt is a 80 yr old male with incomplete paraplegia ASIA C-- now ASIA C/D with previous sacral decub, severe spasticity and chronic foley and neurogenic bowel and bladder and nerve pain. On hospice- in October 2021.    Retry Duloxetine for nerve pain and mood-  Duloxetine/Cymbalta- 20 mg QHS  X 1 week, then 40 mg nightly x 1 week, then 60 mg nightly- for nerve pain. Transferred via hoyer sling- however I suggest trying to get pt to edge of bed- with "a little help"- by son/son in law- without tipping over- for 30-60  seconds- then, call me, and I'll order therapy- and work with [facility]- and might be able to get better? Gets pain meds from Hospice Baclofen- has refills Con't Robaxin/Methocarbemol 750 mg 4x/day- #120- 5 refills.  F/u in 3-4 months I spent a total of 31 minutes on visit- discussing Duloxetine to help for nerve pain. Also discussed possible therapy/spasticity.

## 2021-01-19 ENCOUNTER — Other Ambulatory Visit: Payer: Self-pay

## 2021-01-19 ENCOUNTER — Ambulatory Visit: Payer: Medicare Other | Admitting: Podiatry

## 2021-01-19 ENCOUNTER — Encounter: Payer: Self-pay | Admitting: Podiatry

## 2021-01-19 DIAGNOSIS — M79675 Pain in left toe(s): Secondary | ICD-10-CM

## 2021-01-19 DIAGNOSIS — E1151 Type 2 diabetes mellitus with diabetic peripheral angiopathy without gangrene: Secondary | ICD-10-CM | POA: Diagnosis not present

## 2021-01-19 DIAGNOSIS — B351 Tinea unguium: Secondary | ICD-10-CM

## 2021-01-19 DIAGNOSIS — M79674 Pain in right toe(s): Secondary | ICD-10-CM | POA: Diagnosis not present

## 2021-01-19 NOTE — Progress Notes (Signed)
  Subjective:  Patient ID: [name], male    DOB: [birth year],  MRN: [id number]  Chief Complaint  Patient presents with   Nail Problem    Nail trim    80 y.o. male returns for the above complaint.  His last A1c was 5.6.  Today he is here for debridement of the bilateral mycotic toenails that are painful in nature.  He states that he also noticed his left hallux distal tip pressure sore.  No ulceration noted.  There is mild erythema present.  He has been ambulating with his regular shoes.  He does not recall any pressure sore or points to that area.  Objective:  There were no vitals filed for this visit. Podiatric Exam: Vascular: dorsalis pedis and posterior tibial pulses are palpable bilateral. Capillary return is immediate. Temperature gradient is WNL. Skin turgor WNL  Sensorium: Normal Semmes Weinstein monofilament test. Normal tactile sensation bilaterally. Nail Exam: Pt has thick disfigured discolored nails with subungual debris noted bilateral entire nail hallux through fifth toenails Ulcer Exam: There is no evidence of ulcer or pre-ulcerative changes or infection. Orthopedic Exam: Muscle tone and strength are WNL. No limitations in general ROM. No crepitus or effusions noted. HAV  B/L.  Hammer toes 2-5  B/L. Skin: No Porokeratosis. No infection or ulcers.  Mild superficial pressure sore noted to the left distal hallux.  Assessment & Plan:  Patient was evaluated and treated and all questions answered.  Left hallux distal pressure sore -I explained to the patient the exact etiology of pressure sore and discussed various treatment options for it.  Patient is experiencing pressure to the distal tip of the hallux likely due to either shoes or his bed.  I encouraged that to continue monitoring the pressure sore and make sure that there is absolutely no pressure associated with it.  I discussed shoe gear modification with him.  He states understanding and will continue to monitor.  This can  easily turn into an ulceration and can end up losing a toe I discussed this with him in extensive detail he states understanding  Onychomycosis with pain  -Nails palliatively debrided as below. -Educated on self-care  Procedure: Nail Debridement Rationale: pain  Type of Debridement: manual, sharp debridement. Instrumentation: Nail nipper, rotary burr. Number of Nails: 10  Procedures and Treatment: Consent by patient was obtained for treatment procedures. The patient understood the discussion of treatment and procedures well. All questions were answered thoroughly reviewed. Debridement of mycotic and hypertrophic toenails, 1 through 5 bilateral and clearing of subungual debris. No ulceration, no infection noted.  Return Visit-Office Procedure: Patient instructed to return to the office for a follow up visit 3 months for continued evaluation and treatment.  Boneta Lucks, DPM    Return in about 3 months (around 04/21/2021) for mayer .

## 2021-01-26 ENCOUNTER — Encounter (HOSPITAL_COMMUNITY): Payer: Medicare Other

## 2021-01-28 ENCOUNTER — Other Ambulatory Visit (HOSPITAL_COMMUNITY): Payer: Self-pay | Admitting: Cardiology

## 2021-01-28 DIAGNOSIS — I6523 Occlusion and stenosis of bilateral carotid arteries: Secondary | ICD-10-CM

## 2021-02-03 ENCOUNTER — Other Ambulatory Visit: Payer: Self-pay

## 2021-02-03 ENCOUNTER — Ambulatory Visit (HOSPITAL_COMMUNITY)
Admission: RE | Admit: 2021-02-03 | Discharge: 2021-02-03 | Disposition: A | Discharge status: Home / Self Care | Payer: Medicare Other | Source: Ambulatory Visit | Attending: Cardiology | Admitting: Cardiology

## 2021-02-03 DIAGNOSIS — I6523 Occlusion and stenosis of bilateral carotid arteries: Secondary | ICD-10-CM | POA: Insufficient documentation

## 2021-04-05 ENCOUNTER — Other Ambulatory Visit: Payer: Self-pay | Admitting: Cardiology

## 2021-04-05 ENCOUNTER — Other Ambulatory Visit: Payer: Self-pay | Admitting: Physical Medicine and Rehabilitation

## 2021-04-15 ENCOUNTER — Ambulatory Visit: Payer: Medicare Other | Admitting: Physical Medicine and Rehabilitation

## 2021-04-15 ENCOUNTER — Encounter: Payer: Medicare Other | Admitting: Physical Medicine and Rehabilitation

## 2021-04-16 ENCOUNTER — Other Ambulatory Visit: Payer: Self-pay | Admitting: Neurology

## 2021-04-18 ENCOUNTER — Encounter: Payer: Self-pay | Admitting: Podiatry

## 2021-04-18 ENCOUNTER — Other Ambulatory Visit: Payer: Self-pay

## 2021-04-18 ENCOUNTER — Ambulatory Visit (INDEPENDENT_AMBULATORY_CARE_PROVIDER_SITE_OTHER): Payer: Medicare Other | Admitting: Podiatry

## 2021-04-18 DIAGNOSIS — E118 Type 2 diabetes mellitus with unspecified complications: Secondary | ICD-10-CM

## 2021-04-18 DIAGNOSIS — M79675 Pain in left toe(s): Secondary | ICD-10-CM

## 2021-04-18 DIAGNOSIS — M21371 Foot drop, right foot: Secondary | ICD-10-CM | POA: Diagnosis not present

## 2021-04-18 DIAGNOSIS — M79674 Pain in right toe(s): Secondary | ICD-10-CM

## 2021-04-18 DIAGNOSIS — B351 Tinea unguium: Secondary | ICD-10-CM | POA: Diagnosis not present

## 2021-04-18 DIAGNOSIS — E1151 Type 2 diabetes mellitus with diabetic peripheral angiopathy without gangrene: Secondary | ICD-10-CM

## 2021-04-18 NOTE — Progress Notes (Signed)
This patient returns to my office for at risk foot care.  This patient requires this care by a professional since this patient will be at risk due to having diabetes.He presents to the office with his wife and in a wheelchair.  This patient is unable to cut nails himself since the patient cannot reach his nails.These nails are painful walking and wearing shoes.  This patient presents for at risk foot care today.  General Appearance  Alert, conversant and in no acute stress.  Vascular  Dorsalis pedis and posterior tibial  pulses are  weakly palpable  bilaterally.  Capillary return is within normal limits  bilaterally.  Cold feet.bilaterally.  Neurologic  Senn-Weinstein monofilament wire test within normal limits  bilaterally. Muscle power within normal limits bilaterally.  Nails Thick disfigured discolored nails with subungual debris  from hallux to fifth toes bilaterally. No evidence of bacterial infection or drainage bilaterally.  Orthopedic  No limitations of motion  feet .  No crepitus or effusions noted.  No bony pathology or digital deformities noted. HAV  B/L.  Skin  normotropic skin with no porokeratosis noted bilaterally.  No signs of infections or ulcers noted.     Onychomycosis  Pain in right toes  Pain in left toes  Consent was obtained for treatment procedures.   Mechanical debridement of nails 1-5  bilaterally performed with a nail nipper.  Filed with dremel without incident.    Return office visit   4 months                   Told patient to return for periodic foot care and evaluation due to potential at risk complications.   Haytham Maher DPM   

## 2021-04-27 ENCOUNTER — Ambulatory Visit: Payer: Medicare Other | Admitting: Podiatry

## 2021-05-08 ENCOUNTER — Other Ambulatory Visit: Payer: Self-pay

## 2021-05-08 ENCOUNTER — Encounter (HOSPITAL_BASED_OUTPATIENT_CLINIC_OR_DEPARTMENT_OTHER): Payer: Self-pay

## 2021-05-08 ENCOUNTER — Emergency Department (HOSPITAL_BASED_OUTPATIENT_CLINIC_OR_DEPARTMENT_OTHER)
Admission: EM | Admit: 2021-05-08 | Discharge: 2021-05-08 | Disposition: A | Discharge status: Home / Self Care | Payer: Medicare Other | Attending: Emergency Medicine | Admitting: Emergency Medicine

## 2021-05-08 DIAGNOSIS — E039 Hypothyroidism, unspecified: Secondary | ICD-10-CM | POA: Insufficient documentation

## 2021-05-08 DIAGNOSIS — J449 Chronic obstructive pulmonary disease, unspecified: Secondary | ICD-10-CM | POA: Diagnosis not present

## 2021-05-08 DIAGNOSIS — I110 Hypertensive heart disease with heart failure: Secondary | ICD-10-CM | POA: Insufficient documentation

## 2021-05-08 DIAGNOSIS — Z96652 Presence of left artificial knee joint: Secondary | ICD-10-CM | POA: Insufficient documentation

## 2021-05-08 DIAGNOSIS — I5032 Chronic diastolic (congestive) heart failure: Secondary | ICD-10-CM | POA: Insufficient documentation

## 2021-05-08 DIAGNOSIS — Z85850 Personal history of malignant neoplasm of thyroid: Secondary | ICD-10-CM | POA: Insufficient documentation

## 2021-05-08 DIAGNOSIS — E119 Type 2 diabetes mellitus without complications: Secondary | ICD-10-CM | POA: Diagnosis not present

## 2021-05-08 DIAGNOSIS — Z87891 Personal history of nicotine dependence: Secondary | ICD-10-CM | POA: Insufficient documentation

## 2021-05-08 DIAGNOSIS — Z8616 Personal history of COVID-19: Secondary | ICD-10-CM | POA: Diagnosis not present

## 2021-05-08 DIAGNOSIS — X58XXXA Exposure to other specified factors, initial encounter: Secondary | ICD-10-CM | POA: Diagnosis not present

## 2021-05-08 DIAGNOSIS — T83091A Other mechanical complication of indwelling urethral catheter, initial encounter: Secondary | ICD-10-CM | POA: Insufficient documentation

## 2021-05-08 DIAGNOSIS — T839XXA Unspecified complication of genitourinary prosthetic device, implant and graft, initial encounter: Secondary | ICD-10-CM

## 2021-05-08 MED ORDER — LIDOCAINE HCL URETHRAL/MUCOSAL 2 % EX GEL
1.0000 "application " | Freq: Once | CUTANEOUS | Status: AC
  Administered 2021-05-08: 1 via URETHRAL
  Filled 2021-05-08: qty 11

## 2021-05-08 MED ORDER — CEPHALEXIN 250 MG PO CAPS
500.0000 mg | ORAL_CAPSULE | Freq: Once | ORAL | Status: AC
  Administered 2021-05-08: 500 mg via ORAL
  Filled 2021-05-08: qty 2

## 2021-05-08 MED ORDER — CEPHALEXIN 500 MG PO CAPS: 500.0000 mg | ORAL_CAPSULE | Freq: Two times a day (BID) | ORAL | 0 refills | Status: AC

## 2021-05-08 NOTE — ED Notes (Signed)
RT at bedside  Pt rpeorts wearing 3L [place] at home states he does not take it with him when he goes out. Educated patient on importance of always using home O2. Placed on 3L [place] with O2 of 94%.

## 2021-05-08 NOTE — ED Notes (Signed)
Pt was d/c by Huel Coventry, RN

## 2021-05-08 NOTE — ED Provider Notes (Signed)
[facility] EMERGENCY DEPT Provider Note   CSN: [id number] Arrival date & time: 05/08/21  1524     History Chief Complaint  Patient presents with   foley issue    [name] is a 80 y.o. male.  Patient here due to chronic Foley catheter issue.  Has a chronic Foley catheter functional quadriplegia.  Does accidentally empty his urine but he is is incontinent at times.  His home nurse was unable to get an Foley catheter yesterday.  He has been able to make urine during this time he has just been wearing depends.  Patient did not bring his home oxygen with him and he was hypoxic in triage but quickly resolved when put on his home oxygen.  Wife states that patient hates his portable oxygen tank.  The history is provided by the patient and the spouse.  Illness Severity:  Mild Onset quality:  Gradual Timing:  Constant Progression:  Unchanged Chronicity:  New Associated symptoms: no abdominal pain and no congestion       Past Medical History:  Diagnosis Date   Arthritis    Barrett esophagus    Bronchitis    Carotid artery occlusion    COPD (chronic obstructive pulmonary disease) (HCC)    Diabetes mellitus without complication (HCC)    GERD (gastroesophageal reflux disease)    Headache    migraines   History of thyroid cancer 2008   Hypertension    Restless leg    Sleep apnea with use of continuous positive airway pressure (CPAP)    2011 [facility] , AHI  77cn central and obstrcutive. 16 cm water , 3 cm EPR.    Thoracic ascending aortic aneurysm    4.2 cm ascending TAA 08/18/17 CTA. Annual imaging recommended.   thyroid ca dx'd 2009 or 2010   surg and radioactive isoptope   Ulcer    Peptic ulcer disease    Patient Active Problem List   Diagnosis Date Noted   Wheelchair dependence 12/31/2020   Nerve disorder involving shoulder pain 09/10/2020   Spinal stenosis of lumbosacral region 09/06/2020   Generalized weakness 02/27/2020   Functional  quadriplegia ([place]) 02/27/2020   Pneumonia due to COVID-19 virus 02/20/2020   Acute hypoxemic respiratory failure due to COVID-19 [facility]) 02/19/2020   Hyperlipidemia associated with type 2 diabetes mellitus ([place]) 02/19/2020   Chronic indwelling Foley catheter 02/19/2020   COPD (chronic obstructive pulmonary disease) ([place]) 02/19/2020   Educated about COVID-19 virus infection 01/22/2020   Stage 4 skin ulcer of sacral region ([place]) 01/12/2020   Hypothyroidism    Palliative care by specialist    DNR (do not resuscitate)    Acute on chronic respiratory failure with hypoxia ([place]) 10/29/2019   Paraplegia ([place]) 10/28/2019   Labile blood pressure    Spasticity    Muscle spasm    Hypoxia    Supplemental oxygen dependent    Neurogenic bladder    Chronic diastolic congestive heart failure ([place])    Neurogenic bowel    Hypocalcemia    Spinal stenosis of lumbar region    Cervical myelopathy ([place]) 09/26/2019   Diabetes mellitus type 2, controlled, with complications ([place]) 63/87/5643   Obese 09/17/2019   Thoracic aortic aneurysm 09/17/2019   Bowel incontinence 09/17/2019   Bladder incontinence 09/17/2019   Right hemiparesis ([place]) 09/17/2019   Acute renal failure (ARF) ([place]) 09/17/2019   Left hemiparesis ([place]) 09/17/2019   Chronic respiratory failure with hypoxia ([place]) 09/17/2019   Syncope and collapse 09/17/2019  Chronic diastolic CHF (congestive heart failure) ([place]) 09/04/2019   PVD (peripheral vascular disease) ([place]) 02/14/2019   Hypertension associated with diabetes ([place]) 02/13/2019   Non-insulin treated type 2 diabetes mellitus ([place]) 02/13/2019   Community acquired pneumonia of right lower lobe of lung 01/17/2019   Postural urinary incontinence 05/20/2018   Myelopathy concurrent with and due to spinal stenosis of cervical region [facility]) 05/20/2018   Myoclonic jerking 05/20/2018   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 ([place]) 09/03/2017   History of respiratory failure  08/18/2017   Hypokalemia 08/18/2017   Leukocytosis 08/18/2017   Muscle atrophy of lower extremity 08/09/2017   Bowel and bladder incontinence 08/09/2017   Right-sided muscle weakness 08/09/2017   Neuropathy 08/09/2017   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/2017   Abnormality of gait 07/12/2017   Myoclonic jerkings, 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/2017   Acquired right foot drop 07/12/2017   UTI (urinary tract infection) due to Enterococcus 07/12/2017   Pressure ulcer 06/21/2017   Acute cystitis 06/20/2017   Weakness generalized 06/20/2017   Dehydration 06/20/2017   Dyspnea 12/26/2016   Chronic cough 12/26/2016   Hypersomnia with sleep apnea 10/30/2016   CSA (central sleep apnea) 10/30/2016   Wheezing symptom 10/30/2016   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   RLS (restless legs syndrome) 08/25/2014   Primary gout 08/25/2014   OSA on CPAP 08/25/2014   Severe obesity (BMI >= 40) ([place]) 08/25/2014   Obesity (BMI 30-39.9) 02/18/2013   Occlusion and stenosis of carotid artery without mention of cerebral infarction 11/29/2012   S/P TKR (total knee replacement) 11/29/2012   History of thyroid cancer     Past Surgical History:  Procedure Laterality Date   ANTERIOR CERVICAL DECOMP/DISCECTOMY FUSION N/A 09/03/2017   Procedure: ACDF - C3-C4 - C4-C5 - C5-C6;  Surgeon: Kary Kos, MD;  Location: [facility];  Service: Neurosurgery;  Laterality: N/A;   CAROTID ENDARTERECTOMY Left January 03, 2006   Dr. Amedeo Plenty   COLONOSCOPY     EYE SURGERY Bilateral    cataract surgery with lens implants   game keepers thumb Right    JOINT REPLACEMENT Left    knee X 2   LUMBAR LAMINECTOMY/ DECOMPRESSION WITH MET-RX N/A 11/04/2019   Procedure: LUMBAR THREE-FOUR LUMBAR LAMINECTOMY/ DECOMPRESSION WITH MET-RX;  Surgeon: Judith Part, MD;  Location: [facility];  Service: Neurosurgery;  Laterality: N/A;   RADIOLOGY WITH ANESTHESIA N/A 10/16/2019   Procedure: MRI THORACIC LUMBAR SPINE;  Surgeon: Radiologist,  Medication, MD;  Location: [facility];  Service: Radiology;  Laterality: N/A;   ROTATOR CUFF REPAIR Right    THYROIDECTOMY  2008       Family History  Problem Relation Age of Onset   COPD Mother    Hyperlipidemia Mother    Hypertension Mother    Diabetes Father    Kidney disease Father        ESRD   Hypertension Father    Cancer Father    Hyperlipidemia Father     Social History   Tobacco Use   Smoking status: Former    Years: 40.00    Types: Cigars, Cigarettes    Quit date: 06/19/2005    Years since quitting: 15.8   Smokeless tobacco: Never   Tobacco comments:    smoked 4-6 cigars daily, only smoked cigarettes socially  Vaping Use   Vaping Use: Never used  Substance Use Topics   Alcohol use: No   Drug use: No    Home Medications   Allergies  Patient has no known allergies.  Review of Systems   Review of Systems  HENT:  Negative for congestion.   Gastrointestinal:  Negative for abdominal pain.  Genitourinary:  Positive for difficulty urinating. Negative for decreased urine volume, dysuria and flank pain.   Physical Exam Updated Vital Signs BP (!) 111/57 (BP Location: Right Arm)   Pulse 70   Temp 98.6 F (37 C)   Resp 18   Ht _0  (1.702 m)   SpO2 (!) 77%   BMI 26.78 kg/m   Physical Exam Constitutional:      General: He is not in acute distress.    Appearance: He is not ill-appearing.  Cardiovascular:     Pulses: Normal pulses.  Pulmonary:     Effort: Pulmonary effort is normal.  Abdominal:     General: There is no distension.     Tenderness: There is no abdominal tenderness.  Neurological:     Mental Status: He is alert.    ED Results / Procedures / Treatments   Labs (all labs ordered are listed, but only abnormal results are displayed) Labs Reviewed - No data to display  EKG None  Radiology No results found.  Procedures Procedures   Medications Ordered in ED Medications  lidocaine (XYLOCAINE) 2 % jelly 1 application (has no  administration in time range)  cephALEXin (KEFLEX) capsule 500 mg (has no administration in time range)    ED Course  I have reviewed the triage vital signs and the nursing notes.  Pertinent labs & imaging results that were available during my care of the patient were reviewed by me and considered in my medical decision making (see chart for details).      [name] is here with Foley catheter issue.  Foley was leaking.  Nursing staff replaced it.  Antibiotics started prophylactically.  Patient did not wear his home oxygen here because he does not like to wear his portable machine.  He was hypoxic but improved with oxygen here.  No respiratory symptoms.  Discharged back home with family.  This chart was dictated using voice recognition software.  Despite best efforts to proofread,  errors can occur which can change the documentation meaning.   Final Clinical Impression(s) / ED Diagnoses Final diagnoses:  Problem with Foley catheter, initial encounter [facility])    Rx / DC Orders ED Discharge Orders          Ordered    cephALEXin (KEFLEX) 500 MG capsule  2 times daily        05/08/21 1657             Lennice Sites, DO 05/08/21 1700

## 2021-05-08 NOTE — ED Triage Notes (Signed)
Pt arrives with wife, in power wheelchair from home with reports of needing have a catheter placed. States that his was leaking yesterday, home hospice nurse tried to get one in but was unable to do so. Wife states patient is having urine output but uses catheter to prevent skin breakdown.

## 2021-05-09 ENCOUNTER — Encounter: Payer: Self-pay | Admitting: Neurology

## 2021-05-09 ENCOUNTER — Ambulatory Visit (INDEPENDENT_AMBULATORY_CARE_PROVIDER_SITE_OTHER): Payer: Medicare Other | Admitting: Neurology

## 2021-05-09 DIAGNOSIS — G4731 Primary central sleep apnea: Secondary | ICD-10-CM | POA: Diagnosis not present

## 2021-05-09 DIAGNOSIS — J449 Chronic obstructive pulmonary disease, unspecified: Secondary | ICD-10-CM

## 2021-05-09 DIAGNOSIS — G4733 Obstructive sleep apnea (adult) (pediatric): Secondary | ICD-10-CM | POA: Diagnosis not present

## 2021-05-09 NOTE — Progress Notes (Signed)
Virtual Visit via Telephone Note  I connected with [name] on 05/09/21 at 10:30 AM EST by telephone and verified that I am speaking with the correct person using two identifiers.  Location: Patient: at home  Provider: at [facility]    I discussed the limitations, risks, security and privacy concerns of performing an evaluation and management service by telephone and the availability of in person appointments. I also discussed with the patient that there may be a patient responsible charge related to this service. The patient expressed understanding and agreed to proceed.   History of Present Illness: complex sleep apnea and very sleepy, for 6-8 weeks, copd, on prednisone and still sleeping.   Emergency room visit , at the [facility] location on 05-08-2021 - urinary catheter was replaced yesterday.    Baclofen, methocarbamol,  gabapentin, hydrocodone only at night.  Prescribed by hospice of [facility] , oxygen on cpap at 4 liters.  The dose at night is 7.5 mg hydrocodone with 325 mg of acetaminophen.  Neurontin / gabapentin he is taking at 300 mg at night,  and baclofen 40 mg 4 times a day.    Observations/Objective:  Excellent compliance 100% of days 100% of time on CPAP between 12 and 20 cmH2O with 1 cm expiratory pressure relief.  Average user time 6 hours 58 minutes.  Residual events per hour are artificially escalated his AHI was 6.6 of which unknown apneas and hypopneas accounted for 5.4/h.  The supine Type of respiratory events are clearly related to high air leak and do not warrant me.  The patient also receives 4 L of oxygen through his CPAP.  He is sleepy within 2-1/2 hours of getting up in the morning and seems to have now for the last 6 to 8 weeks with daily 2-hour nap between 10 and 12 AM.    How likely are you to doze in the following situations: 0 = not likely, 1 = slight chance, 2 = moderate chance, 3 = high chance  Sitting and Reading?2 Watching  Television?3 Sitting inactive in a public place (theater or meeting)? 3 Lying down in the afternoon when circumstances permit? 3 Sitting and talking to someone?1 Sitting quietly after lunch without alcohol?2 In a car, while stopped for a few minutes in traffic?0 As a passenger in a car for an hour without a break?3  Total = 17  Worse from 10-12 noon, needs a 2 hour nap-   Assessment and Plan:   OSA, hypoxia and increased sleepiness;  Mr. [name] has used his CPAP 100% of the last 30 days and 100% over 4 hours.  Average use at time is 6 hours 58 minutes but he confesses that after he wakes up in the morning and may have breakfast he goes back to nap for about 2 hours.  This kind of sleepiness has exacerbated over the last 6 to 8 weeks and may well be related to medications which I have discussed with the patient and his wife today.  One of them is baclofen, he uses narcotic pain medication as needed currently only at nighttime but this may well stay in his system longer than the anticipated. The dose at night is 7.5 mg hydrocodone with 325 mg of acetaminophen.  Neurontin is taking at 300 mg at night and baclofen 40 mg 4 times a day. medication management certainly can contribte to his sleepiness, but he is managed by palliative care. He has COPD, 4 liters 02, and n  uses CPAP for complex sleep apnea.    Rv in 6 months.    Follow Up Instructions:  NP in 6 months for either virtual or face to face visit.      I discussed the assessment and treatment plan with the patient. The patient was provided an opportunity to ask questions and all were answered. The patient agreed with the plan and demonstrated an understanding of the instructions.   The patient was advised to call back or seek an in-person evaluation if the symptoms worsen or if the condition fails to improve as anticipated.  I provided 15 minutes of non-face-to-face time during this encounter.   Larey Seat, MD

## 2021-05-09 NOTE — Patient Instructions (Signed)
Chronic Obstructive Pulmonary Disease Chronic obstructive pulmonary disease (COPD) is a long-term (chronic) lung problem. When you have COPD, it is hard for air to get in and out of your lungs. Usually the condition gets worse over time, and your lungs will never return to normal. There are things you can do to keep yourself as healthy as possible. What are the causes? Smoking. This is the most common cause. Certain genes passed from parent to child (inherited). What increases the risk? Being exposed to secondhand smoke from cigarettes, pipes, or cigars. Being exposed to chemicals and other irritants, such as fumes and dust in the work environment. Having chronic lung conditions or infections. What are the signs or symptoms? Shortness of breath, especially during physical activity. A long-term cough with a large amount of thick mucus. Sometimes, the cough may not have any mucus (dry cough). Wheezing. Breathing quickly. Skin that looks gray or blue, especially in the fingers, toes, or lips. Feeling tired (fatigue). Weight loss. Chest tightness. Having infections often. Episodes when breathing symptoms become much worse (exacerbations). At the later stages of this disease, you may have swelling in the ankles, feet, or legs. How is this treated? Taking medicines. Quitting smoking, if you smoke. Rehabilitation. This includes steps to make your body work better. It may involve a team of specialists. Doing exercises. Making changes to your diet. Using oxygen. Lung surgery. Lung transplant. Comfort measures (palliative care). Follow these instructions at home: Medicines Take over-the-counter and prescription medicines only as told by your doctor. Talk to your doctor before taking any cough or allergy medicines. You may need to avoid medicines that cause your lungs to be dry. Lifestyle If you smoke, stop smoking. Smoking makes the problem worse. Do not smoke or use any products that  contain nicotine or tobacco. If you need help quitting, ask your doctor. Avoid being around things that make your breathing worse. This may include smoke, chemicals, and fumes. Stay active, but remember to rest as well. Learn and use tips on how to manage stress and control your breathing. Make sure you get enough sleep. Most adults need at least 7 hours of sleep every night. Eat healthy foods. Eat smaller meals more often. Rest before meals. Controlled breathing Learn and use tips on how to control your breathing as told by your doctor. Try: Breathing in (inhaling) through your nose for 1 second. Then, pucker your lips and breath out (exhale) through your lips for 2 seconds. Putting one hand on your belly (abdomen). Breathe in slowly through your nose for 1 second. Your hand on your belly should move out. Pucker your lips and breathe out slowly through your lips. Your hand on your belly should move in as you breathe out.  Controlled coughing Learn and use controlled coughing to clear mucus from your lungs. Follow these steps: Lean your head a little forward. Breathe in deeply. Try to hold your breath for 3 seconds. Keep your mouth slightly open while coughing 2 times. Spit any mucus out into a tissue. Rest and do the steps again 1 or 2 times as needed. General instructions Make sure you get all the shots (vaccines) that your doctor recommends. Ask your doctor about a flu shot and a pneumonia shot. Use oxygen therapy and pulmonary rehabilitation if told by your doctor. If you need home oxygen therapy, ask your doctor if you should buy a tool to measure your oxygen level (oximeter). Make a COPD action plan with your doctor. This helps you to   know what to do if you feel worse than usual. Manage any other conditions you have as told by your doctor. Avoid going outside when it is very hot, cold, or humid. Avoid people who have a sickness you can catch (contagious). Keep all follow-up  visits. Contact a doctor if: You cough up more mucus than usual. There is a change in the color or thickness of the mucus. It is harder to breathe than usual. Your breathing is faster than usual. You have trouble sleeping. You need to use your medicines more often than usual. You have trouble doing your normal activities such as getting dressed or walking around the house. Get help right away if: You have shortness of breath while resting. You have shortness of breath that stops you from: Being able to talk. Doing normal activities. Your chest hurts for longer than 5 minutes. Your skin color is more blue than usual. Your pulse oximeter shows that you have low oxygen for longer than 5 minutes. You have a fever. You feel too tired to breathe normally. These symptoms may represent a serious problem that is an emergency. Do not wait to see if the symptoms will go away. Get medical help right away. Call your local emergency services (911 in the [place] Do not drive yourself to the hospital. Summary Chronic obstructive pulmonary disease (COPD) is a long-term lung problem. The way your lungs work will never return to normal. Usually the condition gets worse over time. There are things you can do to keep yourself as healthy as possible. Take over-the-counter and prescription medicines only as told by your doctor. If you smoke, stop. Smoking makes the problem worse. This information is not intended to replace advice given to you by your health care provider. Make sure you discuss any questions you have with your health care provider. Document Revised: 04/13/2020 Document Reviewed: 04/13/2020 Elsevier Patient Education  [place]. CPAP and BIPAP Information CPAP and BIPAP are methods that use air pressure to keep your airways open and to help you breathe well. CPAP and BIPAP use different amounts of pressure. Your health care provider will tell you whether CPAP or BIPAP would be more  helpful for you. CPAP stands for "continuous positive airway pressure." With CPAP, the amount of pressure stays the same while you breathe in (inhale) and out (exhale). BIPAP stands for "bi-level positive airway pressure." With BIPAP, the amount of pressure will be higher when you inhale and lower when you exhale. This allows you to take larger breaths. CPAP or BIPAP may be used in the hospital, or your health care provider may want you to use it at home. You may need to have a sleep study before your health care provider can order a machine for you to use at home. What are the advantages? CPAP or BIPAP can be helpful if you have: Sleep apnea. Chronic obstructive pulmonary disease (COPD). Heart failure. Medical conditions that cause muscle weakness, including muscular dystrophy or amyotrophic lateral sclerosis (ALS). Other problems that cause breathing to be shallow, weak, abnormal, or difficult. CPAP and BIPAP are most commonly used for obstructive sleep apnea (OSA) to keep the airways from collapsing when the muscles relax during sleep. What are the risks? Generally, this is a safe treatment. However, problems may occur, including: Irritated skin or skin sores if the mask does not fit properly. Dry or stuffy nose or nosebleeds. Dry mouth. Feeling gassy or bloated. Sinus or lung infection if the equipment is not cleaned properly.  When should CPAP or BIPAP be used? In most cases, the mask only needs to be worn during sleep. Generally, the mask needs to be worn throughout the night and during any daytime naps. People with certain medical conditions may also need to wear the mask at other times, such as when they are awake. Follow instructions from your health care provider about when to use the machine. What happens during CPAP or BIPAP? Both CPAP and BIPAP are provided by a small machine with a flexible plastic tube that attaches to a plastic mask that you wear. Air is blown through the mask  into your nose or mouth. The amount of pressure that is used to blow the air can be adjusted on the machine. Your health care provider will set the pressure setting and help you find the best mask for you. Tips for using the mask Because the mask needs to be snug, some people feel trapped or closed-in (claustrophobic) when first using the mask. If you feel this way, you may need to get used to the mask. One way to do this is to hold the mask loosely over your nose or mouth and then gradually apply the mask more snugly. You can also gradually increase the amount of time that you use the mask. Masks are available in various types and sizes. If your mask does not fit well, talk with your health care provider about getting a different one. Some common types of masks include: Full face masks, which fit over the mouth and nose. Nasal masks, which fit over the nose. Nasal pillow or prong masks, which fit into the nostrils. If you are using a mask that fits over your nose and you tend to breathe through your mouth, a chin strap may be applied to help keep your mouth closed. Use a skin barrier to protect your skin as told by your health care provider. Some CPAP and BIPAP machines have alarms that may sound if the mask comes off or develops a leak. If you have trouble with the mask, it is very important that you talk with your health care provider about finding a way to make the mask easier to tolerate. Do not stop using the mask. There could be a negative impact on your health if you stop using the mask. Tips for using the machine Place your CPAP or BIPAP machine on a secure table or stand near an electrical outlet. Know where the on/off switch is on the machine. Follow instructions from your health care provider about how to set the pressure on your machine and when you should use it. Do not eat or drink while the CPAP or BIPAP machine is on. Food or fluids could get pushed into your lungs by the pressure of  the CPAP or BIPAP. For home use, CPAP and BIPAP machines can be rented or purchased through home health care companies. Many different brands of machines are available. Renting a machine before purchasing may help you find out which particular machine works well for you. Your health insurance company may also decide which machine you may get. Keep the CPAP or BIPAP machine and attachments clean. Ask your health care provider for specific instructions. Check the humidifier if you have a dry stuffy nose or nosebleeds. Make sure it is working correctly. Follow these instructions at home: Take over-the-counter and prescription medicines only as told by your health care provider. Ask if you can take sinus medicine if your sinuses are blocked. Do not use  any products that contain nicotine or tobacco. These products include cigarettes, chewing tobacco, and vaping devices, such as e-cigarettes. If you need help quitting, ask your health care provider. Keep all follow-up visits. This is important. Contact a health care provider if: You have redness or pressure sores on your head, face, mouth, or nose from the mask or head gear. You have trouble using the CPAP or BIPAP machine. You cannot tolerate wearing the CPAP or BIPAP mask. Someone tells you that you snore even when wearing your CPAP or BIPAP. Get help right away if: You have trouble breathing. You feel confused. Summary CPAP and BIPAP are methods that use air pressure to keep your airways open and to help you breathe well. If you have trouble with the mask, it is very important that you talk with your health care provider about finding a way to make the mask easier to tolerate. Do not stop using the mask. There could be a negative impact to your health if you stop using the mask. Follow instructions from your health care provider about when to use the machine. This information is not intended to replace advice given to you by your health care  provider. Make sure you discuss any questions you have with your health care provider. Document Revised: 01/12/2021 Document Reviewed: 05/14/2020 Elsevier Patient Education  2022 [employer].

## 2021-05-11 ENCOUNTER — Other Ambulatory Visit: Payer: Self-pay | Admitting: Cardiology

## 2021-06-21 ENCOUNTER — Other Ambulatory Visit: Payer: Self-pay | Admitting: Cardiology

## 2021-06-22 ENCOUNTER — Other Ambulatory Visit: Payer: Self-pay | Admitting: Cardiology

## 2021-07-04 ENCOUNTER — Ambulatory Visit: Payer: Medicare Other | Admitting: Physical Medicine and Rehabilitation

## 2021-07-11 ENCOUNTER — Other Ambulatory Visit: Payer: Self-pay | Admitting: Physical Medicine and Rehabilitation

## 2021-07-11 ENCOUNTER — Other Ambulatory Visit: Payer: Self-pay | Admitting: Neurology

## 2021-07-15 ENCOUNTER — Encounter: Payer: Self-pay | Admitting: Physical Medicine and Rehabilitation

## 2021-07-15 ENCOUNTER — Other Ambulatory Visit: Payer: Self-pay

## 2021-07-15 ENCOUNTER — Encounter
Payer: Medicare Other | Attending: Physical Medicine and Rehabilitation | Admitting: Physical Medicine and Rehabilitation

## 2021-07-15 VITALS — BP 109/73 | HR 73

## 2021-07-15 DIAGNOSIS — Z993 Dependence on wheelchair: Secondary | ICD-10-CM | POA: Diagnosis not present

## 2021-07-15 DIAGNOSIS — Z978 Presence of other specified devices: Secondary | ICD-10-CM

## 2021-07-15 DIAGNOSIS — Z515 Encounter for palliative care: Secondary | ICD-10-CM | POA: Diagnosis present

## 2021-07-15 DIAGNOSIS — J449 Chronic obstructive pulmonary disease, unspecified: Secondary | ICD-10-CM

## 2021-07-15 DIAGNOSIS — G8220 Paraplegia, unspecified: Secondary | ICD-10-CM | POA: Diagnosis present

## 2021-07-15 DIAGNOSIS — N319 Neuromuscular dysfunction of bladder, unspecified: Secondary | ICD-10-CM | POA: Diagnosis not present

## 2021-07-15 DIAGNOSIS — R531 Weakness: Secondary | ICD-10-CM

## 2021-07-15 MED ORDER — DICLOFENAC SODIUM 50 MG PO TBEC: 50.0000 mg | DELAYED_RELEASE_TABLET | Freq: Every day | ORAL | 1 refills | Status: DC | PRN

## 2021-07-15 MED ORDER — TIZANIDINE HCL 4 MG PO TABS: 2.0000 mg | ORAL_TABLET | Freq: Two times a day (BID) | ORAL | 5 refills | Status: DC | PRN

## 2021-07-15 NOTE — Patient Instructions (Signed)
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 Also has severe COPD and HTN- no CKD. No blood thinners.  On hospice- SCI in October 2021.  Likely skin cancer-     Bicipital tendinitis- Doesn't want a topical agent since so painful. Suggest Diclofenac 50 mg take 50 mg daily x 5 days THEN as needed- last Cr was <1 and GFR OK in 10/21- don't have recent labs, but no Hx of being on blood thinners or CKD. Will send in daily as needed #30 with 1 refill.   2. Just refilled Methcarbemol- 750 mg QID/4x/day- con't   3. Baclofen 40 mg 4x/day- refilled for 6 months in October- has 3 more months. Con't Baclofen for spasticity-   4. Needs to get a biopsy of L forearm- to make sure isn't melanoma- suggest seeing PCP vs dermatology. Likely skin cancer.   5.  Pressure ulcers healed- - still needs to do pressure relief- needs to tilt in space/pressure relief every 20-30 minutes- no matter what when in w/c.   6.  Zanaflex/Tizanidine 2 mg (1/2 tab) up to 2x/day as needed for when spasms get bad- an take a max of 4 mg (1 full tab) 2x/day-- maximum.     7. F/U in 4 months- double appt. SCI

## 2021-07-15 NOTE — Progress Notes (Signed)
Subjective:    Patient ID: [name], male    DOB: 01-31-1941, 81 y.o.   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 Also has COPD;  On hospice- SCI in October 2021.    Doesn't like to wear O2- will wear at home, but won't take outside home.  Averages 88% with O2 at home.   Still using hoyer lift ot transfer- actually a little bit weaker and getting more weak.   Still on Norco- only takes 1x/day 7.5/325 mg- only takes at night- hospice gives Rx.  Sometimes ~ 1x/month uses 41m/Norco for HA-   Spasticity- RLE at times tolerable- and sometimes, kicking self in "balls". Sometimes no times/day- and sometimes 10x/day- when has downturn  q3 weeks or so, will have a worsening- goes back up afterwards, but not quite as high, so are cumulative! Takes Baclofen - 40 mg 4x/day Robaxin 750 mg- also takes 4x/day.  Gabapentin- 300 mg QHS  Bladder- still has foley- hospice changes 1x/month- last done yesterday.   Bowel- inconsistent- 2-3x/week- last downturn was 2 weeks ago- had diarrhea 4 loose stools in 1 day-  Not doing bowel program/suppository- does miralax daily- senokot S 1 tab daily.   Large temperature difference between upper chest and lower abdomen- colder on Legs and lower abd- not painful.   Still real problems with L shoulder- anterior shoulder  Will having sharp stabbing pain in L shoulder- when doing nothing- anterior usually, but also on top somewhat.   Has spot on L forearm- worried what it is.   Pressure ulcers are healed- pt still feels like they are there, but healed. Has feeling it's still there.   Pain Inventory Average Pain 3 Pain Right Now 3 My pain is stabbing  LOCATION OF PAIN  neck, shoulder, back, buttocks, knee  BOWEL Number of stools per week: 2 Oral laxative use Yes  Type of laxative  miralax Enema or suppository use No  History of colostomy No  Incontinent No   BLADDER Suprapubic In and out cath, frequency . Able to self cath Yes  Bladder incontinence Yes  Frequent urination No  Leakage with coughing No  Difficulty starting stream No  Incomplete bladder emptying No    Mobility ability to climb steps?  no do you drive?  no use a wheelchair needs help with transfers  Function retired  Neuro/Psych bladder control problems bowel control problems weakness trouble walking spasms  Prior Studies Any changes since last visit?  no  Physicians involved in your care Any changes since last visit?  no   Family History  Problem Relation Age of Onset   COPD Mother    Hyperlipidemia Mother    Hypertension Mother    Diabetes Father    Kidney disease Father        ESRD   Hypertension Father    Cancer Father    Hyperlipidemia Father    Social History   Socioeconomic History   Marital status: Married    Spouse name: [name]  Number of children: 2   Years of education: COLLEGE   Highest education level: Not on file  Occupational History    Employer: RETIRED  Tobacco Use   Smoking status: Former    Years: 40.00    Types: Cigars, Cigarettes    Quit date: 06/19/2005  Years since quitting: 16.0   Smokeless tobacco: Never   Tobacco comments:    smoked 4-6 cigars daily, only smoked cigarettes socially  Vaping Use   Vaping Use: Never used  Substance and Sexual Activity   Alcohol use: No   Drug use: No   Sexual activity: Not on file  Other Topics Concern   Not on file  Social History Narrative   Patient is married [name]) and lives at home with his wife.   Patient has two children.   Patient is retired.   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   Patient is right-handed.   Patient drinks three cups of coffee per day.   Social Determinants of Health   Financial Resource Strain: Not on file  Food Insecurity: Not on file   Transportation Needs: Not on file  Physical Activity: Not on file  Stress: Not on file  Social Connections: Not on file   Past Surgical History:  Procedure Laterality Date   ANTERIOR CERVICAL DECOMP/DISCECTOMY FUSION N/A 09/03/2017   Procedure: ACDF - C3-C4 - C4-C5 - C5-C6;  Surgeon: Kary Kos, MD;  Location: [place];  Service: Neurosurgery;  Laterality: N/A;   CAROTID ENDARTERECTOMY Left January 03, 2006   Dr. Amedeo Plenty   COLONOSCOPY     EYE SURGERY Bilateral    cataract surgery with lens implants   game keepers thumb Right    JOINT REPLACEMENT Left    knee X 2   LUMBAR LAMINECTOMY/ DECOMPRESSION WITH MET-RX N/A 11/04/2019   Procedure: LUMBAR THREE-FOUR LUMBAR LAMINECTOMY/ DECOMPRESSION WITH MET-RX;  Surgeon: Judith Part, MD;  Location: [place];  Service: Neurosurgery;  Laterality: N/A;   RADIOLOGY WITH ANESTHESIA N/A 10/16/2019   Procedure: MRI THORACIC LUMBAR SPINE;  Surgeon: Radiologist, Medication, MD;  Location: [facility];  Service: Radiology;  Laterality: N/A;   ROTATOR CUFF REPAIR Right    THYROIDECTOMY  2008   Past Medical History:  Diagnosis Date   Arthritis    Barrett esophagus    Bronchitis    Carotid artery occlusion    COPD (chronic obstructive pulmonary disease) (HCC)    Diabetes mellitus without complication (HCC)    GERD (gastroesophageal reflux disease)    Headache    migraines   History of thyroid cancer 2008   Hypertension    Restless leg    Sleep apnea with use of continuous positive airway pressure (CPAP)    2011 [facility] , [facility]  77cn central and obstrcutive. 16 cm water , 3 cm EPR.    Thoracic ascending aortic aneurysm    4.2 cm ascending TAA 08/18/17 CTA. Annual imaging recommended.   thyroid ca dx'd 2009 or 2010   surg and radioactive isoptope   Ulcer    Peptic ulcer disease   BP 109/73    Pulse 73    SpO2 (!) 80%   Opioid Risk Score:   Fall Risk Score:  `1  Depression screen PHQ 2/9  Depression screen [facility] 2/9 07/15/2021 09/10/2020 12/08/2019   Decreased Interest 0 0 0  Down, Depressed, Hopeless 0 0 0  PHQ - 2 Score 0 0 0  Altered sleeping - - 2  Tired, decreased energy - - 3  Change in appetite - - 0  Feeling bad or failure about yourself  - - 2  Trouble concentrating - - 0  Moving slowly or fidgety/restless - - 0  Suicidal thoughts - - 0  PHQ-9 Score - - 7  Some  recent data might be hidden     Review of Systems  Musculoskeletal:  Positive for back pain and neck pain.       Knee pain Buttock pain  All other systems reviewed and are negative.     Objective:   Physical Exam  Awake, alert, appropriate, accompanied by wife; in power w/c; hoyer sling under him, NAD O2 sats 78-84% on RA- not wearing O2- better when takes mask off.  Coarse, lots of rhonchi - fair air movement.  Extremely TTP in L anterior shoulder c/w bicipital tendinitis.  L forearm- large blackish scab- nonhealing- looks like clearish discharge. Looks like skin cancer.  Neuro: MAS of 2 in LE's- a few spasms seen in B/L LE's    Had pt do pressure relief correctly.  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 Also has severe COPD and HTN- no CKD. No blood thinners.  On hospice- SCI in October 2021.  Likely skin cancer-     Bicipital tendinitis- Doesn't want a topical agent since so painful. Suggest Diclofenac 50 mg take 50 mg daily x 5 days THEN as needed- last Cr was <1 and GFR OK in 10/21- don't have recent labs, but no Hx of being on blood thinners or CKD. Will send in daily as needed #30 with 1 refill.   2. Just refilled Methcarbemol- 750 mg QID/4x/day- con't   3. Baclofen 40 mg 4x/day- refilled for 6 months in October- has 3 more months. Con't Baclofen for spasticity-   4. Needs to get a biopsy of L forearm- to make sure isn't melanoma- suggest seeing PCP vs dermatology. Likely skin  cancer.   5.  Pressure ulcers healed- - still needs to do pressure relief- needs to tilt in space/pressure relief every 20-30 minutes- no matter what when in w/c.   6.  Zanaflex/Tizanidine 2 mg (1/2 tab) up to 2x/day as needed for when spasms get bad- an take a max of 4 mg (1 full tab) 2x/day-- maximum.     7. F/U in 4 months- double appt. SCI   I spent a total of 35 minutes on visit- d/w pt and wife about spasticity options and my concern has skin cancer on L forearm.

## 2021-08-01 ENCOUNTER — Other Ambulatory Visit: Payer: Self-pay | Admitting: Cardiology

## 2021-08-08 ENCOUNTER — Ambulatory Visit: Payer: Medicare Other | Admitting: Podiatry

## 2021-08-08 ENCOUNTER — Encounter: Payer: Self-pay | Admitting: Podiatry

## 2021-08-08 ENCOUNTER — Other Ambulatory Visit: Payer: Self-pay

## 2021-08-08 DIAGNOSIS — I739 Peripheral vascular disease, unspecified: Secondary | ICD-10-CM | POA: Diagnosis not present

## 2021-08-08 DIAGNOSIS — B351 Tinea unguium: Secondary | ICD-10-CM

## 2021-08-08 DIAGNOSIS — E1151 Type 2 diabetes mellitus with diabetic peripheral angiopathy without gangrene: Secondary | ICD-10-CM

## 2021-08-08 DIAGNOSIS — M79675 Pain in left toe(s): Secondary | ICD-10-CM | POA: Diagnosis not present

## 2021-08-08 DIAGNOSIS — M21371 Foot drop, right foot: Secondary | ICD-10-CM

## 2021-08-08 DIAGNOSIS — M79674 Pain in right toe(s): Secondary | ICD-10-CM

## 2021-08-08 NOTE — Progress Notes (Signed)
This patient returns to my office for at risk foot care.  This patient requires this care by a professional since this patient will be at risk due to having diabetes.He presents to the office with his wife and in a wheelchair.  This patient is unable to cut nails himself since the patient cannot reach his nails.These nails are painful walking and wearing shoes.  This patient presents for at risk foot care today.  General Appearance  Alert, conversant and in no acute stress.  Vascular  Dorsalis pedis and posterior tibial  pulses are  weakly palpable  bilaterally.  Capillary return is within normal limits  bilaterally.  Cold feet.bilaterally.  Neurologic  Senn-Weinstein monofilament wire test within normal limits  bilaterally. Muscle power within normal limits bilaterally.  Nails Thick disfigured discolored nails with subungual debris  from hallux to fifth toes bilaterally. No evidence of bacterial infection or drainage bilaterally.  Orthopedic  No limitations of motion  feet .  No crepitus or effusions noted.  No bony pathology or digital deformities noted. HAV  B/L.  Skin  normotropic skin with no porokeratosis noted bilaterally.  No signs of infections or ulcers noted.     Onychomycosis  Pain in right toes  Pain in left toes  Consent was obtained for treatment procedures.   Mechanical debridement of nails 1-5  bilaterally performed with a nail nipper.  Filed with dremel without incident.    Return office visit   4 months                   Told patient to return for periodic foot care and evaluation due to potential at risk complications.   Emanuell Morina DPM   

## 2021-08-16 ENCOUNTER — Other Ambulatory Visit: Payer: Self-pay | Admitting: Cardiology

## 2021-09-05 ENCOUNTER — Other Ambulatory Visit: Payer: Self-pay | Admitting: Cardiology

## 2021-10-04 ENCOUNTER — Other Ambulatory Visit: Payer: Self-pay | Admitting: Cardiology

## 2021-11-01 ENCOUNTER — Other Ambulatory Visit: Payer: Self-pay | Admitting: Physical Medicine and Rehabilitation

## 2021-11-11 ENCOUNTER — Encounter: Payer: Self-pay | Admitting: Physical Medicine and Rehabilitation

## 2021-11-11 ENCOUNTER — Encounter
Payer: Medicare Other | Attending: Physical Medicine and Rehabilitation | Admitting: Physical Medicine and Rehabilitation

## 2021-11-11 VITALS — BP 101/68 | HR 74 | Ht 67.0 in

## 2021-11-11 DIAGNOSIS — Z978 Presence of other specified devices: Secondary | ICD-10-CM | POA: Diagnosis not present

## 2021-11-11 DIAGNOSIS — N319 Neuromuscular dysfunction of bladder, unspecified: Secondary | ICD-10-CM | POA: Insufficient documentation

## 2021-11-11 DIAGNOSIS — R252 Cramp and spasm: Secondary | ICD-10-CM | POA: Diagnosis not present

## 2021-11-11 DIAGNOSIS — C44629 Squamous cell carcinoma of skin of left upper limb, including shoulder: Secondary | ICD-10-CM | POA: Diagnosis present

## 2021-11-11 DIAGNOSIS — Z993 Dependence on wheelchair: Secondary | ICD-10-CM | POA: Insufficient documentation

## 2021-11-11 DIAGNOSIS — G8220 Paraplegia, unspecified: Secondary | ICD-10-CM | POA: Insufficient documentation

## 2021-11-11 MED ORDER — BACLOFEN 20 MG PO TABS: 40.0000 mg | ORAL_TABLET | Freq: Four times a day (QID) | ORAL | 5 refills | Status: DC

## 2021-11-11 MED ORDER — METHOCARBAMOL 750 MG PO TABS: 750.0000 mg | ORAL_TABLET | Freq: Four times a day (QID) | ORAL | 5 refills | Status: DC

## 2021-11-11 NOTE — Patient Instructions (Signed)
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 Also has severe COPD and HTN- no CKD. No blood thinners.  On hospice- [facility] in October 2021.  Likely skin cancer-   Suggest checking U/a And culture when they think he has UTI- because SCI patients can get weird bugs and need to know what ABX to use if trying to treat vs trying to keep comfortable.  2. Con't Baclofen 40 mg 4x/day for spasticity.   3. 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    4. Con't Zanaflex- 2 mg 2x/day as needed- can use up to4 mg- as needed for muscle spasms- don't give if doesn't ask for it.   5. Con't Methocarbamol- takes 4x/day- pt on hospice- will con't.    6. Try Aquaphor- at drug store- for skin- can apply 1-2x/day- as needed for dry skin- will really help dry skin.    7. Pressure relief =lean all the way back in w/c- every 20 minutes when in w/c.  Has to do pressure relief-   8. Can use Diclofenac as needed- for shoulder pain.    9. F/U in 4 months- double appointment. [facility]

## 2021-11-11 NOTE — Progress Notes (Signed)
Subjective:    Patient ID: [name], male    DOB: 09-16-1940, 81 y.o.   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 Also has severe COPD and HTN- no CKD. No blood thinners.  On hospice- [facility] in October 2021.  Likely skin cancer-   On 4L O2- debating 5L- but has COPD- hospice nurse is debating 5L, but concerned about COPD.  Started on Mucomist yesterday first to try .   Wife noticed has been listing to L more in bed and in w/c.  Doesn't have good sense of where he is in space.   Has foley- changed qmonth- does end of month- so due.   Has been doing well - hasn't been having to see a lot of doctors but seeing hospice doctor- was on PO ABX 2x/this month- first time 3 days- running fever and then also for "bad slump".  Assuming he has UTI- doesn't check urine- when gets ill.   Got suction machine 2 weeks ago- at least having to not spit up as much- stopping it up.  Spasticity- is "still there"- R leg wants to curl up under L leg- and RLE likes to jerk still. LLE will also jerk sometimes.   Only taking Zanaflex ~ 1-2x/week.  On Baclofen 40 mg 4x/day.   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   If gets moved around a lot.  Social Hx: Sees Hospice nurse 2x/week. Going on vacation June 10th.     Pain Inventory Average Pain 6 Pain Right Now 8 My pain is intermittent and sharp  LOCATION OF PAIN  Buttocks  BOWEL Number of stools per week: 2 Oral laxative use Yes  Type of laxative Miralax & Senokot  Incontinent  Foley  BLADDER Foley I  Mobility use a wheelchair needs help with transfers Do you have any goals in this area?  yes  Function retired  Neuro/Psych bowel control  problems spasms  Prior Studies Any changes since last visit?  no  Physicians involved in your care Any changes since last visit?  no   Family History  Problem Relation Age of Onset   COPD Mother    Hyperlipidemia Mother    Hypertension Mother    Diabetes Father    Kidney disease Father        ESRD   Hypertension Father    Cancer Father    Hyperlipidemia Father    Social History   Socioeconomic History   Marital status: Married    Spouse name: [name]   Number of children: 2   Years of education: COLLEGE   Highest education level: Not on file  Occupational History    Employer: RETIRED  Tobacco Use   Smoking status: Former    Years: 40.00    Types: Cigars, Cigarettes    Quit date: 06/19/2005    Years since quitting: 16.4   Smokeless tobacco: Never   Tobacco comments:    smoked 4-6 cigars daily, only smoked cigarettes socially  Vaping Use   Vaping Use: Never used  Substance and Sexual Activity   Alcohol use: No   Drug use: No   Sexual activity: Not on file  Other Topics Concern   Not on file  Social History Narrative   Patient is married [name]) and lives at home with his wife.   Patient has two children.   Patient is retired.   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   Patient is right-handed.   Patient drinks three cups of coffee per day.   Social Determinants of Health   Financial Resource Strain: Not on file  Food Insecurity: Not on file  Transportation Needs: Not on file  Physical Activity: Not on file  Stress: Not on file  Social Connections: Not on file   Past Surgical History:  Procedure Laterality Date   ANTERIOR CERVICAL DECOMP/DISCECTOMY FUSION N/A 09/03/2017   Procedure: ACDF - C3-C4 - C4-C5 - C5-C6;  Surgeon: Kary Kos, MD;  Location: [facility];  Service: Neurosurgery;  Laterality: N/A;   CAROTID ENDARTERECTOMY Left January 03, 2006   Dr. Amedeo Plenty   COLONOSCOPY     EYE SURGERY Bilateral    cataract surgery with lens implants    game keepers thumb Right    JOINT REPLACEMENT Left    knee X 2   LUMBAR LAMINECTOMY/ DECOMPRESSION WITH MET-RX N/A 11/04/2019   Procedure: LUMBAR THREE-FOUR LUMBAR LAMINECTOMY/ DECOMPRESSION WITH MET-RX;  Surgeon: Judith Part, MD;  Location: [facility];  Service: Neurosurgery;  Laterality: N/A;   RADIOLOGY WITH ANESTHESIA N/A 10/16/2019   Procedure: MRI THORACIC LUMBAR SPINE;  Surgeon: Radiologist, Medication, MD;  Location: [facility];  Service: Radiology;  Laterality: N/A;   ROTATOR CUFF REPAIR Right    THYROIDECTOMY  2008   Past Medical History:  Diagnosis Date   Arthritis    Barrett esophagus    Bronchitis    Carotid artery occlusion    COPD (chronic obstructive pulmonary disease) (HCC)    Diabetes mellitus without complication (HCC)    GERD (gastroesophageal reflux disease)    Headache    migraines   History of thyroid cancer 2008   Hypertension    Restless leg    Sleep apnea with use of continuous positive airway pressure (CPAP)    [facility]  77cn central and obstrcutive. 16 cm water , 3 cm EPR.    Thoracic ascending aortic aneurysm (HCC)    4.2 cm ascending TAA 08/18/17 CTA. Annual imaging recommended.   thyroid ca dx'd 2009 or 2010   surg and radioactive isoptope   Ulcer    Peptic ulcer disease   BP 101/68   Pulse 74   Ht '5\' 7"'  (1.702 m)   SpO2 92% Comment: 4 lites for O2  BMI 26.78 kg/m   Opioid Risk Score:   Fall Risk Score:  `1  Depression screen [facility] 2/9     11/11/2021   10:25 AM 07/15/2021   10:05 AM 09/10/2020   11:25 AM 12/08/2019   10:58 AM  Depression screen PHQ 2/9  Decreased Interest 0 0 0 0  Down, Depressed, Hopeless 0 0 0 0  PHQ - 2 Score 0 0 0 0  Altered sleeping    2  Tired, decreased energy    3  Change in appetite    0  Feeling bad or failure about yourself     2  Trouble concentrating    0  Moving slowly or fidgety/restless    0  Suicidal thoughts    0  PHQ-9 Score    7    Review of Systems  Respiratory:  Positive for  apnea.  Gastrointestinal:  Positive for constipation.  Genitourinary:        Foley in place   All other systems reviewed and are negative.     Objective:   Physical Exam Awake, alert, in power w/c; listing to Left dramatically; accompanied by wife; on 4L O2 by [place] NAD Audible rhonchi Listening- very decreased breath sounds- diffuse, loud rhonchi and wheezing; prolonged expiration  Going into some extensor tone- in LE's with ROM, but today actually no clonus seen on exam!!! MAS of 2 in LE's, better on L than R.    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 Also has severe COPD and HTN- no CKD. No blood thinners.  On hospice- [facility] in October 2021.  Likely skin cancer-   Suggest checking U/a And culture when they think he has UTI- because SCI patients can get weird bugs and need to know what ABX to use if trying to treat vs trying to keep comfortable.  2. Con't Baclofen 40 mg 4x/day for spasticity.   3. 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    4. Con't Zanaflex- 2 mg 2x/day as needed- can use up to4 mg- as needed for muscle spasms- don't give if doesn't ask for it.   5. Con't Methocarbamol- takes 4x/day- pt on hospice- will con't.    6. Try Aquaphor- at drug store- for skin- can apply 1-2x/day- as needed for dry skin- will really help dry skin.    7. Pressure relief =lean all the way back in w/c- every 20 minutes when in w/c.  Has to do pressure relief-   8. Can use Diclofenac as needed- for shoulder pain.    9. F/U in 4 months- double appointment. [facility]   I spent a total of  31  minutes on total care today-  >50% coordination of care- due to discussion with pt/wife- about pressure relief and spasticity as well as need for biopsy/lesion removed from L arm.

## 2021-11-16 ENCOUNTER — Telehealth: Payer: Self-pay

## 2021-11-16 NOTE — Telephone Encounter (Signed)
PA for Methocarbamol sent to insurance through CoverMyMeds

## 2021-11-16 NOTE — Telephone Encounter (Signed)
Attempted to do a PA for Baclofen on CoverMyMeds. This medication or product is on your plan's list of covered drugs. Prior authorization is not required at this time. If your pharmacy has questions regarding the processing of your prescription, please have them call the [facility] pharmacy help desk at [phone]. **Please note: This request was submitted electronically. Formulary lowering, tiering exception, cost reduction and/or pre-benefit determination review (including prospective Medicare hospice reviews) requests cannot be requested using this method of submission. Providers contact us at [phone] for further assistance.

## 2021-11-17 ENCOUNTER — Telehealth: Payer: Self-pay

## 2021-11-17 NOTE — Telephone Encounter (Signed)
ERROR

## 2021-11-17 NOTE — Telephone Encounter (Signed)
Additional information sent to [facility]

## 2021-11-18 NOTE — Telephone Encounter (Signed)
Request Reference Number: [id number]. METHOCARBAM TAB '750MG'$  is approved through 06/18/2022. Your patient may now fill this prescription and it will be covered.

## 2021-12-07 ENCOUNTER — Ambulatory Visit: Payer: Medicare Other | Admitting: Podiatry

## 2021-12-07 ENCOUNTER — Encounter: Payer: Self-pay | Admitting: Podiatry

## 2021-12-07 DIAGNOSIS — M79674 Pain in right toe(s): Secondary | ICD-10-CM | POA: Diagnosis not present

## 2021-12-07 DIAGNOSIS — B351 Tinea unguium: Secondary | ICD-10-CM | POA: Diagnosis not present

## 2021-12-07 DIAGNOSIS — E1151 Type 2 diabetes mellitus with diabetic peripheral angiopathy without gangrene: Secondary | ICD-10-CM

## 2021-12-07 DIAGNOSIS — I739 Peripheral vascular disease, unspecified: Secondary | ICD-10-CM

## 2021-12-07 DIAGNOSIS — M79675 Pain in left toe(s): Secondary | ICD-10-CM | POA: Diagnosis not present

## 2021-12-07 DIAGNOSIS — M21371 Foot drop, right foot: Secondary | ICD-10-CM

## 2021-12-07 NOTE — Progress Notes (Signed)
This patient returns to my office for at risk foot care.  This patient requires this care by a professional since this patient will be at risk due to having diabetes.He presents to the office with his wife and in a wheelchair.  This patient is unable to cut nails himself since the patient cannot reach his nails.These nails are painful walking and wearing shoes.  This patient presents for at risk foot care today.  General Appearance  Alert, conversant and in no acute stress.  Vascular  Dorsalis pedis and posterior tibial  pulses are  weakly palpable  bilaterally.  Capillary return is within normal limits  bilaterally.  Cold feet.bilaterally.  Neurologic  Senn-Weinstein monofilament wire test within normal limits  bilaterally. Muscle power within normal limits bilaterally.  Nails Thick disfigured discolored nails with subungual debris  from hallux to fifth toes bilaterally. No evidence of bacterial infection or drainage bilaterally.  Orthopedic  No limitations of motion  feet .  No crepitus or effusions noted.  No bony pathology or digital deformities noted. HAV  B/L.  Skin  normotropic skin with no porokeratosis noted bilaterally.  No signs of infections or ulcers noted.     Onychomycosis  Pain in right toes  Pain in left toes  Consent was obtained for treatment procedures.   Mechanical debridement of nails 1-5  bilaterally performed with a nail nipper.  Filed with dremel without incident.    Return office visit   4 months                   Told patient to return for periodic foot care and evaluation due to potential at risk complications.   Makenzi Bannister DPM   

## 2022-03-15 ENCOUNTER — Ambulatory Visit: Payer: Medicare Other | Admitting: Physical Medicine and Rehabilitation

## 2022-03-17 ENCOUNTER — Encounter: Payer: Medicare Other | Admitting: Physical Medicine and Rehabilitation

## 2022-04-05 ENCOUNTER — Encounter: Payer: Medicare Other | Admitting: Physical Medicine and Rehabilitation

## 2022-04-12 ENCOUNTER — Encounter: Payer: Self-pay | Admitting: Physical Medicine and Rehabilitation

## 2022-04-12 ENCOUNTER — Encounter
Payer: Medicare Other | Attending: Physical Medicine and Rehabilitation | Admitting: Physical Medicine and Rehabilitation

## 2022-04-12 VITALS — Ht 67.0 in

## 2022-04-12 DIAGNOSIS — R252 Cramp and spasm: Secondary | ICD-10-CM | POA: Diagnosis not present

## 2022-04-12 DIAGNOSIS — G959 Disease of spinal cord, unspecified: Secondary | ICD-10-CM | POA: Diagnosis not present

## 2022-04-12 DIAGNOSIS — Z993 Dependence on wheelchair: Secondary | ICD-10-CM | POA: Diagnosis not present

## 2022-04-12 DIAGNOSIS — Z978 Presence of other specified devices: Secondary | ICD-10-CM

## 2022-04-12 DIAGNOSIS — R531 Weakness: Secondary | ICD-10-CM | POA: Diagnosis not present

## 2022-04-12 DIAGNOSIS — N319 Neuromuscular dysfunction of bladder, unspecified: Secondary | ICD-10-CM

## 2022-04-12 MED ORDER — BACLOFEN 20 MG PO TABS: 40.0000 mg | ORAL_TABLET | Freq: Four times a day (QID) | ORAL | 5 refills | Status: DC

## 2022-04-12 NOTE — Progress Notes (Signed)
Subjective:    Patient ID: [name], male    DOB: February 05, 1941, 81 y.o.   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 Also has severe COPD and HTN- no CKD. No blood thinners.  On hospice- [facility] in October 2021.  Likely skin cancer-  On O2 all the time now.    Baclofen 40 mg QID Only used Zanaflex 2-4 mg BID prn- only used 2x since prescribed.   Has/getting over infection - lung vs UTI vs both.  So spasms worse  Spasms got worse with most recent infection- was still not great, but better than now- per wife, also missed a dose of baclofen- slept through dose last weekend.  Spasms so bad in RLE- feels like might kick sle fin scrotum- so frequent and violent.   Has chronic foley- changed 1x/month- no issues And doesn't have bowel program  Takes Norco 7.5 mg/325- 2x/day- and occ lower dose if has breakthrough if needed.   Biggest complaint is neck- posterior neck- lidocaine cream doesn't help.  And radiates out from neck- hurts over muscles, not bones, per pt.   Takes Robaxin 500 mg QID when takes baclofne- not helping.  Voltaren not helping either.   Only takes gabapnetin at night- knocks him out to take during day.   Went to 89 th bday- oldest granddaughter- sushi for her meal- didn't eat at all.        Pain Inventory Average Pain 5 Pain Right Now 5 My pain is intermittent, sharp, and burning  In the last 24 hours, has pain interfered with the following? General activity 0 Relation with others 0 Enjoyment of life 0 What TIME of day is your pain at its worst? varies Sleep (in general) Good  Pain is worse with: unsure Pain improves with: rest and medication Relief from Meds: 6  Family History  Problem Relation Age of Onset   COPD Mother    Hyperlipidemia Mother    Hypertension Mother    Diabetes  Father    Kidney disease Father        ESRD   Hypertension Father    Cancer Father    Hyperlipidemia Father    Social History   Socioeconomic History   Marital status: Married    Spouse name: [name]   Number of children: 2   Years of education: COLLEGE   Highest education level: Not on file  Occupational History    Employer: RETIRED  Tobacco Use   Smoking status: Former    Years: 40.00    Types: Cigars, Cigarettes    Quit date: 06/19/2005    Years since quitting: 16.8   Smokeless tobacco: Never   Tobacco comments:    smoked 4-6 cigars daily, only smoked cigarettes socially  Vaping Use   Vaping Use: Never used  Substance and Sexual Activity   Alcohol use: No   Drug use: No   Sexual activity: Not on file  Other Topics Concern   Not on file  Social History Narrative   Patient is married [name]) and lives at home with his wife.   Patient has two children.   Patient is retired.   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   Patient is right-handed.   Patient drinks three cups of coffee per day.  Social Determinants of Health   Financial Resource Strain: Not on file  Food Insecurity: Not on file  Transportation Needs: Not on file  Physical Activity: Not on file  Stress: Not on file  Social Connections: Not on file   Past Surgical History:  Procedure Laterality Date   ANTERIOR CERVICAL DECOMP/DISCECTOMY FUSION N/A 09/03/2017   Procedure: ACDF - C3-C4 - C4-C5 - C5-C6;  Surgeon: Cram, Gary, MD;  Location[facility];  Service: Neurosurgery;  Laterality: N/A;   CAROTID ENDARTERECTOMY Left January 03, 2006   Dr. Hayes   COLONOSCOPY     EYE SURGERY Bilateral    cataract surgery with lens implants   game keepers thumb Right    JOINT REPLACEMENT Left    knee X 2   LUMBAR LAMINECTOMY/ DECOMPRESSION WITH MET-RX N/A 11/04/2019   Procedure: LUMBAR THREE-FOUR LUMBAR LAMINECTOMY/ DECOMPRESSION WITH MET-RX;  Surgeon: Ostergard, Thomas A, MD;  Location: [facility];   Service: Neurosurgery;  Laterality: N/A;   RADIOLOGY WITH ANESTHESIA N/A 10/16/2019   Procedure: MRI THORACIC LUMBAR SPINE;  Surgeon: Radiologist, Medication, MD;  Location: [facility];  Service: Radiology;  Laterality: N/A;   ROTATOR CUFF REPAIR Right    THYROIDECTOMY  2008   Past Surgical History:  Procedure Laterality Date   ANTERIOR CERVICAL DECOMP/DISCECTOMY FUSION N/A 09/03/2017   Procedure: ACDF - C3-C4 - C4-C5 - C5-C6;  Surgeon: Cram, Gary, MD;  Location[facility];  Service: Neurosurgery;  Laterality: N/A;   CAROTID ENDARTERECTOMY Left January 03, 2006   Dr. Hayes   COLONOSCOPY     EYE SURGERY Bilateral    cataract surgery with lens implants   game keepers thumb Right    JOINT REPLACEMENT Left    knee X 2   LUMBAR LAMINECTOMY/ DECOMPRESSION WITH MET-RX N/A 11/04/2019   Procedure: LUMBAR THREE-FOUR LUMBAR LAMINECTOMY/ DECOMPRESSION WITH MET-RX;  Surgeon: Ostergard, Thomas A, MD;  Location[facility];  Service: Neurosurgery;  Laterality: N/A;   RADIOLOGY WITH ANESTHESIA N/A 10/16/2019   Procedure: MRI THORACIC LUMBAR SPINE;  Surgeon: Radiologist, Medication, MD;  Location: [facility];  Service: Radiology;  Laterality: N/A;   ROTATOR CUFF REPAIR Right    THYROIDECTOMY  2008   Past Medical History:  Diagnosis Date   Arthritis    Barrett esophagus    Bronchitis    Carotid artery occlusion    COPD (chronic obstructive pulmonary disease) (HCC)    Diabetes mellitus without complication (HCC)    GERD (gastroesophageal reflux disease)    Headache    migraines   History of thyroid cancer 2008   Hypertension    Restless leg    Sleep apnea with use of continuous positive airway pressure (CPAP)    [facility]  77cn central and obstrcutive. 16 cm water , 3 cm EPR.    Thoracic ascending aortic aneurysm (HCC)    4.2 cm ascending TAA 08/18/17 CTA. Annual imaging recommended.   thyroid ca dx'd 2009 or 2010   surg and radioactive isoptope   Ulcer    Peptic ulcer disease   Ht 5' 7" (1.702 m)    BMI 26.78 kg/m   Opioid Risk Score:   Fall Risk Score:  `1  Depression screen PHQ 2/9     04/12/2022   11:37 AM 11/11/2021   10:25 AM 07/15/2021   10:05 AM 09/10/2020   11:25 AM 12/08/2019   10:58 AM  Depression screen PHQ 2/9  Decreased Interest 0 0 0 0 0  Down, Depressed,   Hopeless 0 0 0 0 0  PHQ - 2 Score 0 0 0 0 0  Altered sleeping     2  Tired, decreased energy     3  Change in appetite     0  Feeling bad or failure about yourself      2  Trouble concentrating     0  Moving slowly or fidgety/restless     0  Suicidal thoughts     0  PHQ-9 Score     7      Review of Systems  Musculoskeletal:  Positive for neck pain.  All other systems reviewed and are negative.     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 Also has severe COPD and HTN- no CKD. No blood thinners.  On hospice- SCI in October 2021.  Likely skin cancer-  On O2 all the time now.   Can take Tizanidine 2-4 mg - up to 2x/day- don't hold off taking unless BP is less than 86/60s- if it's more than that, can give it, if needed.  Make it your go-to esp when sick- when sick, doesn't matter if it makes him sleepy- since already lethargic when sick.   2.  Con't Baclofen 40 mg 4x/day- has 1 refill left-   3. Con't Methocarbamol- doesn't need refils.    4.  Try to take Gabapentin 600 mg nightly- if it helps some, then let me know and I can prescribe different dose-    5. Hold off on SCHEDULED trazodone until knows how gabapentin makes him sleep at night. But can give if needed.  Since only on 50 mg QHS.   6. F/U in 3 months- double appt- SCI   I spent a  total of  26  minutes on total care today- >50% coordination of care- due to discussion of options to treat increase in spasticity and nerve pain.   

## 2022-04-12 NOTE — Patient Instructions (Signed)
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 Also has severe COPD and HTN- no CKD. No blood thinners.  On hospice- SCI in October 2021.  Likely skin cancer-  On O2 all the time now.   Can take Tizanidine 2-4 mg - up to 2x/day- don't hold off taking unless BP is less than 86/60s- if it's more than that, can give it, if needed.  Make it your go-to esp when sick- when sick, doesn't matter if it makes him sleepy- since already lethargic when sick.   2.  Con't Baclofen 40 mg 4x/day- has 1 refill left-   3. Con't Methocarbamol- doesn't need refils.    4.  Try to take Gabapentin 600 mg nightly- if it helps some, then let me know and I can prescribe different dose-    5. Hold off on SCHEDULED trazodone until knows how gabapentin makes him sleep at night. But can give if needed.  Since only on 50 mg QHS.   6. F/U in 3 months- double appt- [facility]

## 2022-04-14 ENCOUNTER — Encounter: Payer: Self-pay | Admitting: Podiatry

## 2022-04-14 ENCOUNTER — Ambulatory Visit (INDEPENDENT_AMBULATORY_CARE_PROVIDER_SITE_OTHER): Payer: Medicare Other | Admitting: Podiatry

## 2022-04-14 DIAGNOSIS — E118 Type 2 diabetes mellitus with unspecified complications: Secondary | ICD-10-CM

## 2022-04-14 DIAGNOSIS — I739 Peripheral vascular disease, unspecified: Secondary | ICD-10-CM

## 2022-04-14 DIAGNOSIS — E1151 Type 2 diabetes mellitus with diabetic peripheral angiopathy without gangrene: Secondary | ICD-10-CM

## 2022-04-14 DIAGNOSIS — B351 Tinea unguium: Secondary | ICD-10-CM

## 2022-04-14 DIAGNOSIS — M79674 Pain in right toe(s): Secondary | ICD-10-CM

## 2022-04-14 DIAGNOSIS — M79675 Pain in left toe(s): Secondary | ICD-10-CM

## 2022-04-14 NOTE — Progress Notes (Signed)
This patient returns to my office for at risk foot care.  This patient requires this care by a professional since this patient will be at risk due to having diabetes.He presents to the office with his wife and in a wheelchair.  This patient is unable to cut nails himself since the patient cannot reach his nails.These nails are painful walking and wearing shoes.  This patient presents for at risk foot care today.  General Appearance  Alert, conversant and in no acute stress.  Vascular  Dorsalis pedis and posterior tibial  pulses are  weakly palpable  bilaterally.  Capillary return is within normal limits  bilaterally.  Cold feet.bilaterally.  Neurologic  Senn-Weinstein monofilament wire test within normal limits  bilaterally. Muscle power within normal limits bilaterally.  Nails Thick disfigured discolored nails with subungual debris  from hallux to fifth toes bilaterally. No evidence of bacterial infection or drainage bilaterally.  Orthopedic  No limitations of motion  feet .  No crepitus or effusions noted.  No bony pathology or digital deformities noted. HAV  B/L.  Skin  normotropic skin with no porokeratosis noted bilaterally.  No signs of infections or ulcers noted.     Onychomycosis  Pain in right toes  Pain in left toes  Consent was obtained for treatment procedures.   Mechanical debridement of nails 1-5  bilaterally performed with a nail nipper.  Filed with dremel without incident.    Return office visit   4 months                   Told patient to return for periodic foot care and evaluation due to potential at risk complications.   Gardiner Barefoot DPM

## 2022-07-10 ENCOUNTER — Other Ambulatory Visit: Payer: Self-pay | Admitting: Physical Medicine and Rehabilitation

## 2022-07-14 ENCOUNTER — Encounter
Payer: Medicare Other | Attending: Physical Medicine and Rehabilitation | Admitting: Physical Medicine and Rehabilitation

## 2022-07-14 ENCOUNTER — Encounter: Payer: Self-pay | Admitting: Physical Medicine and Rehabilitation

## 2022-07-14 VITALS — BP 107/67 | HR 79

## 2022-07-14 DIAGNOSIS — J9611 Chronic respiratory failure with hypoxia: Secondary | ICD-10-CM | POA: Insufficient documentation

## 2022-07-14 DIAGNOSIS — R32 Unspecified urinary incontinence: Secondary | ICD-10-CM | POA: Diagnosis not present

## 2022-07-14 DIAGNOSIS — R159 Full incontinence of feces: Secondary | ICD-10-CM | POA: Insufficient documentation

## 2022-07-14 DIAGNOSIS — G959 Disease of spinal cord, unspecified: Secondary | ICD-10-CM | POA: Diagnosis present

## 2022-07-14 DIAGNOSIS — G8220 Paraplegia, unspecified: Secondary | ICD-10-CM | POA: Diagnosis not present

## 2022-07-14 MED ORDER — TIZANIDINE HCL 4 MG PO TABS: 2.0000 mg | ORAL_TABLET | Freq: Two times a day (BID) | ORAL | 5 refills | Status: DC | PRN

## 2022-07-14 NOTE — Patient Instructions (Addendum)
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 Also has severe COPD and HTN- no CKD. No blood thinners.  On hospice- SCI in October 2021.  Likely skin cancer-  On O2 all the time now.    I assume you are having progression of neck compression and associated spasticity- and cannot have new surgery, so an MRI doesn't make sense right now   2.  Con't Zanaflex/Tizanidine  3. Just refilled Methocarbamol- con't for tightness in neck, not spasticity.   4. Con't Baclofen 40 mg 4x/day- con'tinue  5. Takes Norco 7.5 mg  2x/day- per Hospice team who refills it.    6. Tizanidine 2-4 mg 2x/day as needed- just started using more regularly- will send in refill since hasn't had since 1/23.    7. Go home and albuterol- at least 2 puffs-   8. F/U in  3 months- double appointment-[facility]  9. SCI support [id number] 6-7 pm last Thursday of month-

## 2022-07-14 NOTE — Progress Notes (Signed)
Subjective:    Patient ID: [name], male    DOB: [birth year], 82 y.o.   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 Also has severe COPD and HTN- no CKD. No blood thinners.  On hospice- SCI in October 2021.  Likely skin cancer-  On O2 all the time now.   Been having more spasms in arms and upper torso.  Not super frequent, not painful- will come out of nowhere.  Not specific to a particular movement, but gives him Zanaflex when has these spasms. Occurs ~ q 1 week to 10 days.  Gives him Baclofen around the clock on schedule no matter his sedation.    Has hx of C3/C6 ACDF in March 2019- with myelomalacia- which could be cause of UB spasticity.   Still sleepy- sleeps A LOT- last 2 weeks-  80% of last 2 weeks will sleep through lunch meals- has to aggravate him to get him to take lunch meds.  95% will eat breakfast-  Dinner time, depends on how much energy.   "Hands thinner than used to be".  Afraid abd girth/distension is fluid- so PA/hospice will come out to see him next week.   Periodically skin breaks open on buttocks- and hospice nurse is doing a great job managing it.   Coughing- "about the same" Using suction machine- which is newer-  so thick , stops up tubing to cannister. And sticky.   BM 1-2x/week at most. LBM last night.  Always constipated.   Pain Inventory Average Pain 5 Pain Right Now 6 My pain is intermittent and stabbing  LOCATION OF PAIN  neck, buttocks, ankle  BOWEL Number of stools per week: 1 Oral laxative use Yes  Type of laxative miralax, senokot Enema or suppository use No  History of colostomy No  Incontinent Yes   BLADDER Foley In and out cath, frequency . Able to self cath Yes  Bladder incontinence Yes  Frequent urination No  Leakage with coughing No  Difficulty starting  stream No  Incomplete bladder emptying No    Mobility how many minutes can you walk? 0 ability to climb steps?  no do you drive?  no use a wheelchair  Function retired  Neuro/Psych bladder control problems weakness tremor trouble walking spasms confusion  Prior Studies Any changes since last visit?  no  Physicians involved in your care Any changes since last visit?  no   Family History  Problem Relation Age of Onset   COPD Mother    Hyperlipidemia Mother    Hypertension Mother    Diabetes Father    Kidney disease Father        ESRD   Hypertension Father    Cancer Father    Hyperlipidemia Father    Social History   Socioeconomic History   Marital status: Married    Spouse name: [name]   Number of children: 2   Years of education: COLLEGE   Highest education level: Not on file  Occupational History    Employer: RETIRED  Tobacco Use   Smoking status: Former    Years: 40.00    Types: Cigars, Cigarettes    Quit date: 06/19/2005    Years since quitting: 17.0   Smokeless tobacco: Never   Tobacco comments:    smoked 4-6 cigars daily, only smoked cigarettes socially  Vaping Use   Vaping Use: Never used  Substance and Sexual Activity   Alcohol use: No   Drug use: No   Sexual activity: Not on file  Other Topics Concern   Not on file  Social History Narrative   Patient is married [name]) and lives at home with his wife.   Patient has two children.   Patient is retired.   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   Patient is right-handed.   Patient drinks three cups of coffee per day.   Social Determinants of Health   Financial Resource Strain: Not on file  Food Insecurity: Not on file  Transportation Needs: Not on file  Physical Activity: Not on file  Stress: Not on file  Social Connections: Not on file   Past Surgical History:  Procedure Laterality Date   ANTERIOR CERVICAL DECOMP/DISCECTOMY FUSION N/A 09/03/2017   Procedure:  ACDF - C3-C4 - C4-C5 - C5-C6;  Surgeon: Kary Kos, MD;  Location: [place];  Service: Neurosurgery;  Laterality: N/A;   CAROTID ENDARTERECTOMY Left January 03, 2006   Dr. Amedeo Plenty   COLONOSCOPY     EYE SURGERY Bilateral    cataract surgery with lens implants   game keepers thumb Right    JOINT REPLACEMENT Left    knee X 2   LUMBAR LAMINECTOMY/ DECOMPRESSION WITH MET-RX N/A 11/04/2019   Procedure: LUMBAR THREE-FOUR LUMBAR LAMINECTOMY/ DECOMPRESSION WITH MET-RX;  Surgeon: Judith Part, MD;  Location: [place];  Service: Neurosurgery;  Laterality: N/A;   RADIOLOGY WITH ANESTHESIA N/A 10/16/2019   Procedure: MRI THORACIC LUMBAR SPINE;  Surgeon: Radiologist, Medication, MD;  Location: [place];  Service: Radiology;  Laterality: N/A;   ROTATOR CUFF REPAIR Right    THYROIDECTOMY  2008   Past Medical History:  Diagnosis Date   Arthritis    Barrett esophagus    Bronchitis    Carotid artery occlusion    COPD (chronic obstructive pulmonary disease) (HCC)    Diabetes mellitus without complication (HCC)    GERD (gastroesophageal reflux disease)    Headache    migraines   History of thyroid cancer 2008   Hypertension    Restless leg    Sleep apnea with use of continuous positive airway pressure (CPAP)    [facility]  77cn central and obstrcutive. 16 cm water , 3 cm EPR.    Thoracic ascending aortic aneurysm (HCC)    4.2 cm ascending TAA 08/18/17 CTA. Annual imaging recommended.   thyroid ca dx'd 2009 or 2010   surg and radioactive isoptope   Ulcer    Peptic ulcer disease   BP 107/67   Pulse 79   SpO2 97%   Opioid Risk Score:   Fall Risk Score:  `1  Depression screen [facility] 2/9     04/12/2022   11:37 AM 11/11/2021   10:25 AM 07/15/2021   10:05 AM 09/10/2020   11:25 AM 12/08/2019   10:58 AM  Depression screen PHQ 2/9  Decreased Interest 0 0 0 0 0  Down, Depressed, Hopeless 0 0 0 0 0  PHQ - 2 Score 0 0 0 0 0  Altered sleeping     2  Tired, decreased energy     3  Change in  appetite     0  Feeling bad or failure about yourself      2  Trouble concentrating     0  Moving slowly or fidgety/restless  0  Suicidal thoughts     0  PHQ-9 Score     7     Review of Systems  Musculoskeletal:  Positive for gait problem.       Spasms  Neurological:  Positive for tremors and weakness.  Psychiatric/Behavioral:  Positive for confusion.   All other systems reviewed and are negative.     Objective:   Physical Exam   A lot of rhonchi, productive cough; diffuse wheezing; adequate air movement at the moment- 4L Of O2 by [place] A lot of peeling/thickened skin on face- yellowish- esp around nose-  Thickened skin in hairline and on top of head.   MS: Biceps 4+/5 WE and triceps 4/5 Grip 4/5 And FA 4-/5 B/L    Neuro: (+) hoffman's B/L R>L more brisk on R MAS of zero in Ue's B/L  MAS of 1+ to 2 in LE's No obvious spasms at rest right now- which is better 2-3 beats clonus in LE's.       Assessment & Plan:   Pt is a 82 yr old male with incomplete quadriplegia C4 ASIA D AND  paraplegia ASIA C-- with previous sacral decub, severe spasticity and chronic foley and neurogenic bowel and bladder and nerve pain. Also has severe COPD and HTN- no CKD. No blood thinners.  On hospice- SCI in October 2021.  Likely skin cancer-  On O2 all the time now.    I assume you are having progression of neck compression and associated spasticity- and cannot have new surgery, so an MRI doesn't make sense right now   2.  Con't Zanaflex/Tizanidine  3. Just refilled Methocarbamol- con't for tightness in neck, not spasticity.   4. Con't Baclofen 40 mg 4x/day- con'tinue  5. Takes Norco 7.5 mg  2x/day- per Hospice team who refills it.    6. Tizanidine 2-4 mg 2x/day as needed- just started using more regularly- will send in refill since hasn't had since 1/23.    7. Go home and albuterol- at least 2 puffs-   8. F/U in  3 months- double appointment-SCI  9. SCI support [id number] 6-7 pm last Thursday of month-     I spent a total of  32  minutes on total care today- >50% coordination of care- due to discussion of quadriplegia vs just paraplegia;  too tired to come to SCI support group- spasticity.

## 2022-07-26 ENCOUNTER — Encounter: Payer: Self-pay | Admitting: Podiatry

## 2022-07-26 ENCOUNTER — Ambulatory Visit: Payer: Medicare Other | Admitting: Podiatry

## 2022-07-26 DIAGNOSIS — B351 Tinea unguium: Secondary | ICD-10-CM

## 2022-07-26 DIAGNOSIS — M79674 Pain in right toe(s): Secondary | ICD-10-CM

## 2022-07-26 DIAGNOSIS — M79675 Pain in left toe(s): Secondary | ICD-10-CM | POA: Diagnosis not present

## 2022-07-26 DIAGNOSIS — E1151 Type 2 diabetes mellitus with diabetic peripheral angiopathy without gangrene: Secondary | ICD-10-CM | POA: Diagnosis not present

## 2022-07-26 NOTE — Progress Notes (Signed)
This patient returns to my office for at risk foot care.  This patient requires this care by a professional since this patient will be at risk due to having diabetes.He presents to the office with his wife and in a wheelchair.  This patient is unable to cut nails himself since the patient cannot reach his nails.These nails are painful walking and wearing shoes.  This patient presents for at risk foot care today.  General Appearance  Alert, conversant and in no acute stress.  Vascular  Dorsalis pedis and posterior tibial  pulses are  weakly palpable  bilaterally.  Capillary return is within normal limits  bilaterally.  Cold feet.bilaterally.  Neurologic  Senn-Weinstein monofilament wire test within normal limits  bilaterally. Muscle power within normal limits bilaterally.  Nails Thick disfigured discolored nails with subungual debris  from hallux to fifth toes bilaterally. No evidence of bacterial infection or drainage bilaterally.  Orthopedic  No limitations of motion  feet .  No crepitus or effusions noted.  No bony pathology or digital deformities noted. HAV  B/L.  Skin  normotropic skin with no porokeratosis noted bilaterally.  No signs of infections or ulcers noted.     Onychomycosis  Pain in right toes  Pain in left toes  Consent was obtained for treatment procedures.   Mechanical debridement of nails 1-5  bilaterally performed with a nail nipper.  Filed with dremel without incident.    Return office visit   4 months                   Told patient to return for periodic foot care and evaluation due to potential at risk complications.   Gardiner Barefoot DPM

## 2022-08-28 ENCOUNTER — Emergency Department (HOSPITAL_COMMUNITY)
Admission: EM | Admit: 2022-08-28 | Discharge: 2022-08-28 | Disposition: A | Discharge status: Home / Self Care | Payer: Medicare Other | Attending: Emergency Medicine | Admitting: Emergency Medicine

## 2022-08-28 DIAGNOSIS — N35919 Unspecified urethral stricture, male, unspecified site: Secondary | ICD-10-CM | POA: Diagnosis not present

## 2022-08-28 DIAGNOSIS — Z79899 Other long term (current) drug therapy: Secondary | ICD-10-CM | POA: Insufficient documentation

## 2022-08-28 DIAGNOSIS — T83091D Other mechanical complication of indwelling urethral catheter, subsequent encounter: Secondary | ICD-10-CM | POA: Insufficient documentation

## 2022-08-28 DIAGNOSIS — Z87448 Personal history of other diseases of urinary system: Secondary | ICD-10-CM | POA: Diagnosis not present

## 2022-08-28 DIAGNOSIS — T839XXD Unspecified complication of genitourinary prosthetic device, implant and graft, subsequent encounter: Secondary | ICD-10-CM

## 2022-08-28 LAB — URINALYSIS, ROUTINE W REFLEX MICROSCOPIC
RBC / HPF: >50 RBC/hpf (ref 0–5)
WBC, UA: >50 WBC/hpf (ref 0–5)

## 2022-08-28 MED ORDER — CEPHALEXIN 500 MG PO CAPS: 500.0000 mg | ORAL_CAPSULE | Freq: Two times a day (BID) | ORAL | 0 refills | Status: AC

## 2022-08-28 MED ORDER — CEFTRIAXONE SODIUM 1 G IJ SOLR
1.0000 g | Freq: Once | INTRAMUSCULAR | Status: DC
  Filled 2022-08-28: qty 10

## 2022-08-28 MED ORDER — SODIUM CHLORIDE 0.9 % IV SOLN
1.0000 g | Freq: Once | INTRAVENOUS | Status: DC
  Filled 2022-08-28: qty 10

## 2022-08-28 MED ORDER — STERILE WATER FOR INJECTION IJ SOLN
INTRAMUSCULAR | Status: AC
  Filled 2022-08-28: qty 10

## 2022-08-28 NOTE — Discharge Instructions (Signed)
You were seen for your Foley catheter replacement in the emergency department.   At home, please take the antibiotics we have prescribed you to prevent infection.    Follow-up with your primary doctor in 2-3 days regarding your visit.  Follow-up with your urologist as soon as possible regarding your symptoms.  Your urine may be blood-tinged and pink for the next few days.  Return immediately to the emergency department if you experience any of the following: Fevers, chills, flank pain, urinary retention, or any other concerning symptoms.    Thank you for visiting our Emergency Department. It was a pleasure taking care of you today.

## 2022-08-28 NOTE — ED Notes (Signed)
Urology at bedside.

## 2022-08-28 NOTE — ED Triage Notes (Signed)
Pt from home with foley displacement. Has been out since Friday and home health nurses unable to replace even with coude. Last attempt yesterday. Having some output. Pt O2 low in triage, placed on 4L baseline in room, 95%.

## 2022-08-28 NOTE — Consult Note (Signed)
Urology Consult    Reason for consult: difficult foley, history of Neurogenic bladder managed with indwelling foley  History of Present Illness: [name] is a 82 y.o. With a history of neurogenic bladder.  He has had an indwelling catheter in place for several years now that is exchanged by home health nurse.  Yesterday they were unable to replace the catheter and presented to the emergency department today.  ED staff was unable after several attempts and urology was consulted.  Per the patient and his wife they had this difficulty once before several years ago.  The patient was last seen in our office in 2021.   Past Medical History:  Diagnosis Date   Arthritis    Barrett esophagus    Bronchitis    Carotid artery occlusion    COPD (chronic obstructive pulmonary disease) (HCC)    Diabetes mellitus without complication (HCC)    GERD (gastroesophageal reflux disease)    Headache    migraines   History of thyroid cancer 2008   Hypertension    Restless leg    Sleep apnea with use of continuous positive airway pressure (CPAP)    [facility]  77cn central and obstrcutive. 16 cm water , 3 cm EPR.    Thoracic ascending aortic aneurysm (HCC)    4.2 cm ascending TAA 08/18/17 CTA. Annual imaging recommended.   thyroid ca dx'd 2009 or 2010   surg and radioactive isoptope   Ulcer    Peptic ulcer disease    Past Surgical History:  Procedure Laterality Date   ANTERIOR CERVICAL DECOMP/DISCECTOMY FUSION N/A 09/03/2017   Procedure: ACDF - C3-C4 - C4-C5 - C5-C6;  Surgeon: Kary Kos, MD;  Location: [place];  Service: Neurosurgery;  Laterality: N/A;   CAROTID ENDARTERECTOMY Left January 03, 2006   Dr. Amedeo Plenty   COLONOSCOPY     EYE SURGERY Bilateral    cataract surgery with lens implants   game keepers thumb Right    JOINT REPLACEMENT Left    knee X 2   LUMBAR LAMINECTOMY/ DECOMPRESSION WITH MET-RX N/A 11/04/2019   Procedure: LUMBAR THREE-FOUR LUMBAR LAMINECTOMY/ DECOMPRESSION  WITH MET-RX;  Surgeon: Judith Part, MD;  Location: [place];  Service: Neurosurgery;  Laterality: N/A;   RADIOLOGY WITH ANESTHESIA N/A 10/16/2019   Procedure: MRI THORACIC LUMBAR SPINE;  Surgeon: Radiologist, Medication, MD;  Location: [place];  Service: Radiology;  Laterality: N/A;   ROTATOR CUFF REPAIR Right    THYROIDECTOMY  2008    Current Hospital Medications:  Home Meds:  No current facility-administered medications on file prior to encounter.   Current Outpatient Medications on File Prior to Encounter  Medication Sig Dispense Refill   acetaminophen (TYLENOL) 325 MG tablet Take 2 tablets (650 mg total) by mouth every 6 (six) hours as needed (pain or temperature).     albuterol (VENTOLIN HFA) 108 (90 Base) MCG/ACT inhaler Inhale 1 puff into the lungs every 4 (four) hours as needed for wheezing or shortness of breath. 6.7 g 1   ATIVAN 0.5 MG tablet Take 0.5 mg by mouth every 4 (four) hours as needed.     baclofen (LIORESAL) 20 MG tablet Take 2 tablets (40 mg total) by mouth 4 (four) times daily. 240 tablet 5   cholecalciferol (VITAMIN D) 25 MCG (1000 UNIT) tablet Take 1 tablet (1,000 Units total) by mouth daily. 30 tablet 0   diclofenac (VOLTAREN) 50 MG EC tablet Take 1 tablet (50 mg total) by mouth daily  as needed. 30 tablet 1   furosemide (LASIX) 40 MG tablet TAKE 1 TABLET (40 MG TOTAL) BY MOUTH DAILY. PATIENT NEEDS AN APPOINTMENT FOR FUTURE REFILLS. 15 tablet 1   gabapentin (NEURONTIN) 300 MG capsule Take 1 capsule (300 mg total) by mouth at bedtime. 90 capsule 1   guaiFENesin (MUCINEX) 600 MG 12 hr tablet Take 2 tablets (1,200 mg total) by mouth 2 (two) times daily. 120 tablet 1   HYDROcodone-acetaminophen (NORCO) 7.5-325 MG tablet Take 1 tablet by mouth 4 (four) times daily as needed.     hyoscyamine (LEVSIN SL) 0.125 MG SL tablet SMARTSIG:1-2 Tablet(s) Sublingual Every 4 Hours     ipratropium-albuterol (DUONEB) 0.5-2.5 (3) MG/3ML SOLN SMARTSIG:1 Vial(s) Via Nebulizer      latanoprost (XALATAN) 0.005 % ophthalmic solution Apply to eye.     levothyroxine (SYNTHROID) 175 MCG tablet Take 175 mcg by mouth daily.     methocarbamol (ROBAXIN) 750 MG tablet TAKE 1 TABLET (750 MG TOTAL) BY MOUTH 4 (FOUR) TIMES DAILY. 120 tablet 5   metoprolol succinate (TOPROL-XL) 50 MG 24 hr tablet Take 1 tablet (50 mg total) by mouth daily. NEED OV. 90 tablet 0   Morphine Sulfate (MORPHINE CONCENTRATE) 10 mg / 0.5 ml concentrated solution      Multiple Vitamin (MULTIVITAMIN) tablet Take 1 tablet by mouth daily.     nitroGLYCERIN (NITROSTAT) 0.4 MG SL tablet Place 0.4 mg under the tongue every 5 (five) minutes as needed for chest pain.     pantoprazole (PROTONIX) 40 MG tablet Take 1 tablet (40 mg total) by mouth every evening. (Patient taking differently: Take 40 mg by mouth daily as needed (indigestion).) 30 tablet 0   polyethylene glycol powder (MIRALAX) 17 GM/SCOOP powder Take 0.5 Containers by mouth daily. 17 gm every other day     rizatriptan (MAXALT) 10 MG tablet 10 mg AS NEEDED (route: oral)     senna-docusate (SENOKOT-S) 8.6-50 MG tablet Take 2 tablets by mouth 2 (two) times daily. (Patient taking differently: Take 2 tablets by mouth at bedtime.)     spironolactone (ALDACTONE) 25 MG tablet 25 mg DAILY  (route: oral)     tiZANidine (ZANAFLEX) 4 MG tablet Take 0.5-1 tablets (2-4 mg total) by mouth 2 (two) times daily as needed for muscle spasms. In addition to Robaxin and baclofen- due to SCI 60 tablet 5   Travoprost, BAK Free, (TRAVATAN) 0.004 % SOLN ophthalmic solution 1 drop at bedtime.     traZODone (DESYREL) 50 MG tablet Take 50 mg by mouth at bedtime.     TRELEGY ELLIPTA 100-62.5-25 MCG/INH AEPB Inhale 1 puff into the lungs daily.   4     Scheduled Meds: Continuous Infusions:  cefTRIAXone (ROCEPHIN)  IV     PRN Meds:.  Allergies:  Allergies  Allergen Reactions   Ciprofloxacin     Other reaction(s): Other (See Comments) Drug interactions    Family History  Problem  Relation Age of Onset   COPD Mother    Hyperlipidemia Mother    Hypertension Mother    Diabetes Father    Kidney disease Father        ESRD   Hypertension Father    Cancer Father    Hyperlipidemia Father     Social History:  reports that he quit smoking about 17 years ago. His smoking use included cigars and cigarettes. He has never used smokeless tobacco. He reports that he does not drink alcohol and does not use drugs.  ROS: A complete review  of systems was performed.  All systems are negative except for pertinent findings as noted.  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  No results for input(s): "NA", "K", "CL", "GLUCOSE", "BUN", "CALCIUM", "CREATININE" in the last 72 hours.  Invalid input(s): "CO3"   No results found for this or any previous visit (from the past 24 hour(s)). No results found for this or any previous visit (from the past 240 hour(s)).  Renal Function: No results for input(s): "CREATININE" in the last 168 hours. CrCl cannot be calculated (Patient's most recent lab result is older than the maximum 21 days allowed.).  Radiologic Imaging: No results found.  I independently reviewed the above imaging studies.  Procedures: Patient was prepped and  draped in usual sterile fashion.  I initially attempted a coud catheter but met resistance after a few centimeters.  I then carefully inserted a cystoscope.  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  I inserted a wire through the stricture.  I then used [place] sounds to serially dilate up to a 20 [place].  Thereafter I was able to place a 16 [place] coud catheter.  There was return of clear pink urine  Impression/Recommendation The patient may continue with his monthly catheter changes with home health and I do anticipate they would have trouble.  If they do he should follow-up with urology as he may need a more definitive procedure on the stricture  Donald Pore MD 08/28/2022, 4:57 PM  [facility]  Pager: [phone]

## 2022-08-28 NOTE — ED Notes (Signed)
Unsuccessful attempt at coude foley catheter insertion. MD aware

## 2022-08-28 NOTE — ED Provider Notes (Signed)
[place] EMERGENCY DEPARTMENT AT [facility] Provider Note   CSN: [place]:[id number] Arrival date & time: 08/28/22  1028     History  No chief complaint on file.   [name] is a 82 y.o. male.  82 year old male with extensive past medical history including neurogenic bladder with indwelling Foley catheter since 2021 who presents to the emergency department with dislodged Foley catheter.  Patient was having a routine catheter exchange on Friday when they were unable to reinsert his Foley catheter.  Says that he has voided several times since then but feels that he may be retaining urine.  They had their home health nurse come back and try and reinsert the Foley catheter yesterday without success so decided to come into the emergency department today for evaluation.  Denies any fevers, flank pain, dysuria or frequency recently.  No traumatic Foley catheter insertions and says that he has not had any bleeding from his penis after they attempted to replace it.         Home Medications Prior to Admission medications   Medication Sig Start Date End Date Taking? Authorizing Provider  cephALEXin (KEFLEX) 500 MG capsule Take 1 capsule (500 mg total) by mouth 2 (two) times daily for 5 days. 08/28/22 09/02/22 Yes Fransico Meadow, MD  acetaminophen (TYLENOL) 325 MG tablet Take 2 tablets (650 mg total) by mouth every 6 (six) hours as needed (pain or temperature). 10/24/19   Angiulli, Lavon Paganini, PA-C  albuterol (VENTOLIN HFA) 108 (90 Base) MCG/ACT inhaler Inhale 1 puff into the lungs every 4 (four) hours as needed for wheezing or shortness of breath. 11/20/19   Angiulli, Lavon Paganini, PA-C  ATIVAN 0.5 MG tablet Take 0.5 mg by mouth every 4 (four) hours as needed. 08/08/21   [provider]  baclofen (LIORESAL) 20 MG tablet Take 2 tablets (40 mg total) by mouth 4 (four) times daily. 04/12/22   Lovorn, Jinny Blossom, MD  cholecalciferol (VITAMIN D) 25 MCG (1000 UNIT) tablet Take 1 tablet (1,000 Units  total) by mouth daily. 11/20/19   Angiulli, Lavon Paganini, PA-C  diclofenac (VOLTAREN) 50 MG EC tablet Take 1 tablet (50 mg total) by mouth daily as needed. 07/15/21   Lovorn, Jinny Blossom, MD  furosemide (LASIX) 40 MG tablet TAKE 1 TABLET (40 MG TOTAL) BY MOUTH DAILY. PATIENT NEEDS AN APPOINTMENT FOR FUTURE REFILLS. 10/06/21   Minus Breeding, MD  gabapentin (NEURONTIN) 300 MG capsule Take 1 capsule (300 mg total) by mouth at bedtime. 07/11/21   Dohmeier, Asencion Partridge, MD  guaiFENesin (MUCINEX) 600 MG 12 hr tablet Take 2 tablets (1,200 mg total) by mouth 2 (two) times daily. 11/20/19   Angiulli, Lavon Paganini, PA-C  HYDROcodone-acetaminophen (NORCO) 7.5-325 MG tablet Take 1 tablet by mouth 4 (four) times daily as needed. 11/16/20   [provider]  hyoscyamine (LEVSIN SL) 0.125 MG SL tablet SMARTSIG:1-2 Tablet(s) Sublingual Every 4 Hours 09/09/21   [provider]  ipratropium-albuterol (DUONEB) 0.5-2.5 (3) MG/3ML SOLN SMARTSIG:1 Vial(s) Via Nebulizer 12/06/20   [provider]  latanoprost (XALATAN) 0.005 % ophthalmic solution Apply to eye. 09/09/20   [provider]  levothyroxine (SYNTHROID) 175 MCG tablet Take 175 mcg by mouth daily. 02/25/20   [provider]  methocarbamol (ROBAXIN) 750 MG tablet TAKE 1 TABLET (750 MG TOTAL) BY MOUTH 4 (FOUR) TIMES DAILY. 07/10/22   Lovorn, Jinny Blossom, MD  metoprolol succinate (TOPROL-XL) 50 MG 24 hr tablet Take 1 tablet (50 mg total) by mouth daily. NEED OV. 04/05/21  Minus Breeding, MD  Morphine Sulfate (MORPHINE CONCENTRATE) 10 mg / 0.5 ml concentrated solution  08/08/21   [provider]  Multiple Vitamin (MULTIVITAMIN) tablet Take 1 tablet by mouth daily.    [provider]  nitroGLYCERIN (NITROSTAT) 0.4 MG SL tablet Place 0.4 mg under the tongue every 5 (five) minutes as needed for chest pain.    [provider]  pantoprazole (PROTONIX) 40 MG tablet Take 1 tablet (40 mg total) by mouth every evening. Patient taking  differently: Take 40 mg by mouth daily as needed (indigestion). 11/20/19   Angiulli, Lavon Paganini, PA-C  polyethylene glycol powder (MIRALAX) 17 GM/SCOOP powder Take 0.5 Containers by mouth daily. 17 gm every other day    [provider]  rizatriptan (MAXALT) 10 MG tablet 10 mg AS NEEDED (route: oral) 04/08/20   [provider]  senna-docusate (SENOKOT-S) 8.6-50 MG tablet Take 2 tablets by mouth 2 (two) times daily. Patient taking differently: Take 2 tablets by mouth at bedtime. 11/10/19   Hosie Poisson, MD  spironolactone (ALDACTONE) 25 MG tablet 25 mg DAILY  (route: oral) 01/22/20   [provider]  tiZANidine (ZANAFLEX) 4 MG tablet Take 0.5-1 tablets (2-4 mg total) by mouth 2 (two) times daily as needed for muscle spasms. In addition to Robaxin and baclofen- due to SCI 07/14/22   Lovorn, Jinny Blossom, MD  Travoprost, BAK Free, (TRAVATAN) 0.004 % SOLN ophthalmic solution 1 drop at bedtime.    [provider]  traZODone (DESYREL) 50 MG tablet Take 50 mg by mouth at bedtime.    [provider]  TRELEGY ELLIPTA 100-62.5-25 MCG/INH AEPB Inhale 1 puff into the lungs daily.  05/01/18   [provider]      Allergies    Ciprofloxacin    Review of Systems   Review of Systems  Physical Exam Updated Vital Signs BP 111/65   Pulse 71   Temp 98.1 F (36.7 C) (Oral)   Resp 18   Ht '5\' 7"'$  (1.702 m)   Wt 72.6 kg   SpO2 95%   BMI 25.06 kg/m  Physical Exam Vitals and nursing note reviewed.  Constitutional:      General: He is not in acute distress.    Appearance: He is well-developed.     Comments: On baseline nasal cannula  HENT:     Head: Normocephalic and atraumatic.     Right Ear: External ear normal.     Left Ear: External ear normal.     Nose: Nose normal.  Eyes:     Extraocular Movements: Extraocular movements intact.     Conjunctiva/sclera: Conjunctivae normal.     Pupils: Pupils are equal, round, and reactive to light.  Cardiovascular:      Heart sounds: Normal heart sounds.  Pulmonary:     Effort: Pulmonary effort is normal. No respiratory distress.  Abdominal:     General: There is no distension.     Palpations: There is no mass.     Tenderness: There is no abdominal tenderness. There is no guarding.  Musculoskeletal:     Cervical back: Normal range of motion and neck supple.  Skin:    General: Skin is warm and dry.  Neurological:     Mental Status: He is alert. Mental status is at baseline.  Psychiatric:        Mood and Affect: Mood normal.        Behavior: Behavior normal.     ED Results / Procedures / Treatments  Labs (all labs ordered are listed, but only abnormal results are displayed) Labs Reviewed  URINALYSIS, ROUTINE W REFLEX MICROSCOPIC    EKG None  Radiology No results found.  Procedures Procedures   Medications Ordered in ED Medications  cefTRIAXone (ROCEPHIN) 1 g in sodium chloride 0.9 % 100 mL IVPB (has no administration in time range)    ED Course/ Medical Decision Making/ A&P Clinical Course as of 08/28/22 1709  Mon Aug 28, 2022  1402 Dr Cain Sieve from urology will come to the bedside shortly. [RP]  1532 Urology was able pass a foley catheter. Patient did have a stricture.  Required cystoscopy for placement.  Did have some hematuria afterwards that was irrigated. [RP]    Clinical Course User Index [RP] Fransico Meadow, MD                             Medical Decision Making Amount and/or Complexity of Data Reviewed Labs: ordered.  Risk Prescription drug management.   [name] is a 82 y.o. male with comorbidities that complicate the patient evaluation including complex past medical history including neurogenic bladder with Foley catheter since 2021 who presents to the emergency department with inability to reinsert Foley catheter  Initial Ddx:  Urinary retention, AKI, UTI  MDM:  Will attempt to reinsert Foley catheter here in the emergency department using a coud  Foley catheter.  If unsuccessful will reach out to urology.  Since he has been voiding spontaneously and is not having any severe pain feel that AKI is highly unlikely but will obtain bladder scan a significant amount of urine is in the bladder we will consider sending labs.  Will also send urinalysis to assess for UTI.  Plan:  Insert Foley catheter Urinalysis  ED Summary/Re-evaluation:  We are unable to pass Foley catheter in the emergency department with coud.  Urology had to come to the emergency department and replace his Foley.  Patient did have significant amount of hematuria and had irrigation performed so he was given a dose of prophylactic ceftriaxone and will be sent home with several days of antibiotics.  Urinalysis was sent to the lab prior to discharge.  This patient presents to the ED for concern of complaints listed in HPI, this involves an extensive number of treatment options, and is a complaint that carries with it a high risk of complications and morbidity. Disposition including potential need for admission considered.   Dispo: DC Home. Return precautions discussed including, but not limited to, those listed in the AVS. Allowed pt time to ask questions which were answered fully prior to dc.  Additional history obtained from significant other Records reviewed Outpatient Clinic Notes I have reviewed the patients home medications and made adjustments as needed Consults: Urology Social Determinants of health:  Elderly   Final Clinical Impression(s) / ED Diagnoses Final diagnoses:  Complication of Foley catheter, subsequent encounter  History of neurogenic bladder  Stricture of male urethra, unspecified stricture type    Rx / DC Orders ED Discharge Orders          Ordered    cephALEXin (KEFLEX) 500 MG capsule  2 times daily        08/28/22 1638              Fransico Meadow, MD 08/28/22 1709

## 2022-10-13 ENCOUNTER — Encounter: Attending: Physical Medicine and Rehabilitation | Admitting: Physical Medicine and Rehabilitation

## 2022-10-13 ENCOUNTER — Encounter: Payer: Self-pay | Admitting: Physical Medicine and Rehabilitation

## 2022-10-13 ENCOUNTER — Telehealth: Payer: Self-pay

## 2022-10-13 VITALS — BP 93/54 | HR 74 | Ht 67.0 in | Wt 169.0 lb

## 2022-10-13 DIAGNOSIS — G959 Disease of spinal cord, unspecified: Secondary | ICD-10-CM | POA: Insufficient documentation

## 2022-10-13 DIAGNOSIS — M7918 Myalgia, other site: Secondary | ICD-10-CM | POA: Insufficient documentation

## 2022-10-13 DIAGNOSIS — G894 Chronic pain syndrome: Secondary | ICD-10-CM | POA: Diagnosis not present

## 2022-10-13 DIAGNOSIS — R252 Cramp and spasm: Secondary | ICD-10-CM | POA: Insufficient documentation

## 2022-10-13 DIAGNOSIS — G8220 Paraplegia, unspecified: Secondary | ICD-10-CM | POA: Insufficient documentation

## 2022-10-13 DIAGNOSIS — Z9981 Dependence on supplemental oxygen: Secondary | ICD-10-CM | POA: Insufficient documentation

## 2022-10-13 DIAGNOSIS — Z5181 Encounter for therapeutic drug level monitoring: Secondary | ICD-10-CM | POA: Insufficient documentation

## 2022-10-13 DIAGNOSIS — Z993 Dependence on wheelchair: Secondary | ICD-10-CM | POA: Diagnosis present

## 2022-10-13 DIAGNOSIS — Z029 Encounter for administrative examinations, unspecified: Secondary | ICD-10-CM | POA: Diagnosis present

## 2022-10-13 MED ORDER — TIZANIDINE HCL 4 MG PO TABS: 2.0000 mg | ORAL_TABLET | Freq: Two times a day (BID) | ORAL | 5 refills | Status: DC | PRN

## 2022-10-13 MED ORDER — HYDROCODONE-ACETAMINOPHEN 7.5-325 MG PO TABS: 1.0000 | ORAL_TABLET | Freq: Four times a day (QID) | ORAL | 0 refills | Status: DC | PRN

## 2022-10-13 MED ORDER — BACLOFEN 20 MG PO TABS: 40.0000 mg | ORAL_TABLET | Freq: Four times a day (QID) | ORAL | 5 refills | Status: DC

## 2022-10-13 NOTE — Progress Notes (Signed)
Subjective:    Patient ID: [name], male    DOB: 09-08-1940, 82 y.o.   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 Also has severe COPD and HTN- no CKD. No blood thinners. Also s/p C3-C6 ACDF 2019.  On hospice- SCI in October 2021.  Likely skin cancer-  On O2 all the time now.   Here for f/u on SCI.   Wore the O2 today- doesn't always, but scared me with O2 level last visit, so wore today.   Makes his ears hurt where [place] is at- Cushions won't stay in place-  Tries gauze padding, but makes it so big/the cannula.   Hospice has discharged him- has plateau'ed-  PCP is picking up other meds.  Just discharged Tuesday PCP won't do Norco anymore- takes 7.5 mg BID and another pill 5 mg- periodically as needed.   Considerably more pain from neck.  Using Zanaflex 2 mg (1/2 pill) in AM and lunch and 4 mg/1 full pill at bedtime.  Still having muscle spasms- but not having "full body seizures" anymore.   Added gabapentin 100 mg at each meal- and 300 mg QHS  Zanaflex and Gabapentin for neck pain.  If moves him at all, pain goes crazy.  Needs pillow under head when turned.  Cannot rest neck.   Base of skull and R side of neck huts the most.    Tolerating bette-r was sleeping all day, but now ~ 25% of that. Since added Zanaflex.     Pain Inventory Average Pain 7 Pain Right Now 8 My pain is intermittent and stabbing  LOCATION OF PAIN  neck, buttocks, ankle  BOWEL Number of stools per week: 1 Oral laxative use Yes  Type of laxative miralax, senokot Enema or suppository use No  History of colostomy No  Incontinent Yes   BLADDER Foley In and out cath, frequency . Able to self cath Yes  Bladder incontinence Yes  Frequent urination No  Leakage with coughing No  Difficulty starting stream No  Incomplete bladder  emptying No    Mobility how many minutes can you walk? 0 ability to climb steps?  no do you drive?  no use a wheelchair  Function retired  Neuro/Psych bladder control problems weakness tremor trouble walking spasms confusion  Prior Studies Any changes since last visit?  no  Physicians involved in your care Any changes since last visit?  no   Family History  Problem Relation Age of Onset   COPD Mother    Hyperlipidemia Mother    Hypertension Mother    Diabetes Father    Kidney disease Father        ESRD   Hypertension Father    Cancer Father    Hyperlipidemia Father    Social History   Socioeconomic History   Marital status: Married    Spouse name: Cesar Pikes   Number of children: 2   Years of education: COLLEGE   Highest education level: Not on file  Occupational History    Employer: RETIRED  Tobacco Use   Smoking status: Former    Years: 40    Types: Cigars, Cigarettes    Quit date: 06/19/2005    Years since quitting: 17.3   Smokeless tobacco: Never   Tobacco comments:    smoked 4-6 cigars daily, only smoked cigarettes  socially  Vaping Use   Vaping Use: Never used  Substance and Sexual Activity   Alcohol use: No   Drug use: No   Sexual activity: Not on file  Other Topics Concern   Not on file  Social History Narrative   Patient is married Cesar Pikes) and lives at home with his wife.   Patient has two children.   Patient is retired.   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   Patient is right-handed.   Patient drinks three cups of coffee per day.   Social Determinants of Health   Financial Resource Strain: Not on file  Food Insecurity: Not on file  Transportation Needs: Not on file  Physical Activity: Not on file  Stress: Not on file  Social Connections: Not on file   Past Surgical History:  Procedure Laterality Date   ANTERIOR CERVICAL DECOMP/DISCECTOMY FUSION N/A 09/03/2017   Procedure: ACDF - C3-C4 - C4-C5 - C5-C6;   Surgeon: Donalee Citrin, MD;  Location[facility] OR;  Service: Neurosurgery;  Laterality: N/A;   CAROTID ENDARTERECTOMY Left January 03, 2006   Dr. Madilyn Fireman   COLONOSCOPY     EYE SURGERY Bilateral    cataract surgery with lens implants   game keepers thumb Right    JOINT REPLACEMENT Left    knee X 2   LUMBAR LAMINECTOMY/ DECOMPRESSION WITH MET-RX N/A 11/04/2019   Procedure: LUMBAR THREE-FOUR LUMBAR LAMINECTOMY/ DECOMPRESSION WITH MET-RX;  Surgeon: Jadene Pierini, MD;  Location: [facility] OR;  Service: Neurosurgery;  Laterality: N/A;   RADIOLOGY WITH ANESTHESIA N/A 10/16/2019   Procedure: MRI THORACIC LUMBAR SPINE;  Surgeon: Radiologist, Medication, MD;  Location: [facility] OR;  Service: Radiology;  Laterality: N/A;   ROTATOR CUFF REPAIR Right    THYROIDECTOMY  2008   Past Medical History:  Diagnosis Date   Arthritis    Barrett esophagus    Bronchitis    Carotid artery occlusion    COPD (chronic obstructive pulmonary disease) (HCC)    Diabetes mellitus without complication (HCC)    GERD (gastroesophageal reflux disease)    Headache    migraines   History of thyroid cancer 2008   Hypertension    Restless leg    Sleep apnea with use of continuous positive airway pressure (CPAP)    2011 [facility] , [facility]  77cn central and obstrcutive. 16 cm water , 3 cm EPR.    Thoracic ascending aortic aneurysm (HCC)    4.2 cm ascending TAA 08/18/17 CTA. Annual imaging recommended.   thyroid ca dx'd 2009 or 2010   surg and radioactive isoptope   Ulcer    Peptic ulcer disease   BP (!) 93/54   Pulse 74   Ht 5\' 7"  (1.702 m)   Wt 169 lb (76.7 kg)   SpO2 91% Comment: 4 L continuous  BMI 26.47 kg/m   Opioid Risk Score:   Fall Risk Score:  `1  Depression screen [facility] 2/9     10/13/2022   11:20 AM 04/12/2022   11:37 AM 11/11/2021   10:25 AM 07/15/2021   10:05 AM 09/10/2020   11:25 AM 12/08/2019   10:58 AM  Depression screen PHQ 2/9  Decreased Interest 0 0 0 0 0 0  Down, Depressed, Hopeless 0 0 0 0 0 0  PHQ - 2  Score 0 0 0 0 0 0  Altered sleeping      2  Tired, decreased energy      3  Change  in appetite      0  Feeling bad or failure about yourself       2  Trouble concentrating      0  Moving slowly or fidgety/restless      0  Suicidal thoughts      0  PHQ-9 Score      7     Review of Systems  Musculoskeletal:  Positive for gait problem.       Spasms  Neurological:  Positive for tremors and weakness.  Psychiatric/Behavioral:  Positive for confusion.   All other systems reviewed and are negative.      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   Neuro: Multiple full body spasms seen-  but milder than normal Hoffman's B/L UE Clonus 3-4 beats in LE's MAS of 1+ in LE's O2 sats 96% on 4L O2.     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 Also has severe COPD and HTN- no CKD. No blood thinners. Also s/p C3-C6 ACDF 2019.  On hospice- SCI in October 2021.  Likely skin cancer-  On O2 all the time now.   Here for f/u on SCI.     Suggest a specific pillow- Sutera pillow- suggest for neck pain  2. Will need Oral drug screen- and opiate contract.    3. Will do Norco 7.5 mg /325 BID and as needed up to 2x/day- so will write for this. Suggest 1/2 tab when takes as needed If possible.    4. Con't Zanaflex  4 mg 2x/day ( or 2/2/4 mg )and Baclofen 40 mg 4x/day.    5. Methocarbamol- want to wean- go to 3x/day x 2 weeks, then 2x/day x 2 weeks, and then from there, take as needed. Has refills right now  6.  Suggest trigger point injections- will get on wait list for trigger point injections and schedule at next appt.   7. Tennis ball- use 2 tennisballs taped together or massager- on base of skull- first 30 seconds hurt more, because muscles tighten up but then relaxes. - can also hold pressure against muscles medial to shoulder/lateral to neck- called upper traps and levators corner of neck and shoulder- hold pressure on them 2-4 minutes on each spot. The bigger the muscle, the more the time.   8.  F/U in 3 months double appt- TRP injections.   9. Patient needs hospital bed- would strongly benefit from fully electric hospital bed, due to being paraplegic- since allows him to transfer better downhill "both ways" if bed can come up and down electric-wise- wife is also >75 yrs old and pumping up bed is physically impossible for her. 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 He meets criteria for oral suction due to his COPD, however PCP is going to keep pt using his O2- since he's on 4L O2 due to severe,  end stage COPD.      I spent a total of  34   minutes on total care today- >50% coordination of care- due to Opiate contract, oral drug screen, d/w pt about pain meds and trP injections and how to relax muscles.

## 2022-10-13 NOTE — Patient Instructions (Signed)
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 Also has severe COPD and HTN- no CKD. No blood thinners. Also s/p C3-C6 ACDF 2019.  On hospice- SCI in October 2021.  Likely skin cancer-  On O2 all the time now.   Here for f/u on SCI.     Suggest a specific pillow- Sutera pillow- suggest for neck pain  2. Will need Oral drug screen- and opiate contract.    3. Will do Norco 7.5 mg /325 BID and as needed up to 2x/day- so will write for this. Suggest 1/2 tab when takes as needed If possible.    4. Con't Zanaflex  4 mg 2x/day ( or 2/2/4 mg )and Baclofen 40 mg 4x/day.    5. Methocarbamol- want to wean- go to 3x/day x 2 weeks, then 2x/day x 2 weeks, and then from there, take as needed. Has refills right now  6. Suggest trigger point injections- will get on wait list for trigger point injections and schedule at next appt.   7. Tennis ball- use 2 tennisballs taped together or massager- on base of skull- first 30 seconds hurt more, because muscles tighten up but then relaxes. - can also hold pressure against muscles medial to shoulder/lateral to neck- called upper traps and levators corner of neck and shoulder- hold pressure on them 2-4 minutes on each spot. The bigger the muscle, the more the time.   8.  F/U in 3 months double appt- TRP injections.

## 2022-10-13 NOTE — Addendum Note (Signed)
Addended by: Janean Sark on: 10/13/2022 12:12 PM   Modules accepted: Orders

## 2022-10-13 NOTE — Telephone Encounter (Signed)
PA Submitted for [facility] (Key: [id number]) Hydrocodone

## 2022-10-16 ENCOUNTER — Telehealth: Payer: Self-pay | Admitting: Neurology

## 2022-10-16 ENCOUNTER — Telehealth: Payer: Self-pay | Admitting: *Deleted

## 2022-10-16 NOTE — Telephone Encounter (Signed)
I called wife multiple times- I'm wiling to write for hospital bed and air overlay, but not Concentrator/O2- I can also try to write for suction- but I cannot write for BiPAP or O2/concentrator . Have to get from [facility]

## 2022-10-16 NOTE — Telephone Encounter (Signed)
Last OV was 05/09/2021, he will need OV with Dr Vickey Huger. Please call to schedule.

## 2022-10-16 NOTE — Telephone Encounter (Signed)
Patient has been discharged from Hospice. Wife requesting orders for pressurized hospital bed, 02 concentrator and a suction. [facility] will be coming today to pull their equipment.

## 2022-10-16 NOTE — Telephone Encounter (Signed)
Faxed order for bed and suction to [facility].

## 2022-10-16 NOTE — Telephone Encounter (Signed)
Pt's wife [name] called and LVM stating that the pt is out of Hospice and is wanting provider to start him up again on his cpap machine from [facility] . Please advise.

## 2022-10-16 NOTE — Telephone Encounter (Signed)
Outcome Approved on April 26 Request Reference Number: [id number]. HYDROCO/APAP TAB 7.5-325 is approved through 11/12/2022. Your patient may now fill this prescription and it will be covered. Authorization Expiration Date: 11/12/2022

## 2022-10-17 LAB — DRUG TOX MONITOR 1 W/CONF, ORAL FLD
Amphetamines: NEGATIVE ng/mL (ref <10)
Barbiturates: NEGATIVE ng/mL (ref <10)
Benzodiazepines: NEGATIVE ng/mL (ref <0.50)
Buprenorphine: NEGATIVE ng/mL (ref <0.10)
Cocaine: NEGATIVE ng/mL (ref <5.0)
Codeine: NEGATIVE ng/mL (ref <2.5)
Dihydrocodeine: NEGATIVE ng/mL (ref <2.5)
Fentanyl: NEGATIVE ng/mL (ref <0.10)
Heroin Metabolite: NEGATIVE ng/mL (ref <1.0)
Hydrocodone: 23.5 ng/mL — ABNORMAL HIGH (ref <2.5)
Hydromorphone: NEGATIVE ng/mL (ref <2.5)
MARIJUANA: NEGATIVE ng/mL (ref <2.5)
MDMA: NEGATIVE ng/mL (ref <10)
Meprobamate: NEGATIVE ng/mL (ref <2.5)
Methadone: NEGATIVE ng/mL (ref <5.0)
Morphine: NEGATIVE ng/mL (ref <2.5)
Nicotine Metabolite: NEGATIVE ng/mL (ref <5.0)
Norhydrocodone: NEGATIVE ng/mL (ref <2.5)
Noroxycodone: NEGATIVE ng/mL (ref <2.5)
Opiates: POSITIVE ng/mL — AB (ref <2.5)
Oxycodone: NEGATIVE ng/mL (ref <2.5)
Oxymorphone: NEGATIVE ng/mL (ref <2.5)
Phencyclidine: NEGATIVE ng/mL (ref <10)
Tapentadol: NEGATIVE ng/mL (ref <5.0)
Tramadol: NEGATIVE ng/mL (ref <5.0)
Zolpidem: NEGATIVE ng/mL (ref <5.0)

## 2022-10-17 LAB — DRUG TOX ALC METAB W/CON, ORAL FLD: Alcohol Metabolite: NEGATIVE ng/mL (ref <25)

## 2022-10-17 NOTE — Telephone Encounter (Signed)
[facility] called and needs a bed narrative addended into his notes, what type of mattress- gel overlay or alt pressure pump and pad. They also need to know if it is trach or oral suction and a diagnosis for suction machine  [phone]

## 2022-10-17 NOTE — Telephone Encounter (Signed)
Pt scheduled to see Dr. Vickey Huger for 10/18/22 at 2pm

## 2022-10-17 NOTE — Telephone Encounter (Signed)
Faxed updated clinical note to [facility] att[facility].

## 2022-10-17 NOTE — Telephone Encounter (Signed)
Pt wife is asking, can a order for a new machine be called in to [facility]. Stated [facility] is coming to pick up CPAP machine. She is requesting a call back from nurse to discuss.

## 2022-10-18 ENCOUNTER — Encounter: Payer: Self-pay | Admitting: Neurology

## 2022-10-18 ENCOUNTER — Ambulatory Visit: Payer: Medicare Other | Admitting: Neurology

## 2022-10-18 VITALS — BP 104/59 | HR 79 | Ht 67.0 in | Wt 165.0 lb

## 2022-10-18 DIAGNOSIS — Z9989 Dependence on other enabling machines and devices: Secondary | ICD-10-CM

## 2022-10-18 DIAGNOSIS — Z993 Dependence on wheelchair: Secondary | ICD-10-CM

## 2022-10-18 DIAGNOSIS — J439 Emphysema, unspecified: Secondary | ICD-10-CM | POA: Diagnosis not present

## 2022-10-18 DIAGNOSIS — Z9981 Dependence on supplemental oxygen: Secondary | ICD-10-CM | POA: Insufficient documentation

## 2022-10-18 DIAGNOSIS — G608 Other hereditary and idiopathic neuropathies: Secondary | ICD-10-CM

## 2022-10-18 DIAGNOSIS — I5033 Acute on chronic diastolic (congestive) heart failure: Secondary | ICD-10-CM

## 2022-10-18 DIAGNOSIS — J418 Mixed simple and mucopurulent chronic bronchitis: Secondary | ICD-10-CM

## 2022-10-18 DIAGNOSIS — G4731 Primary central sleep apnea: Secondary | ICD-10-CM

## 2022-10-18 DIAGNOSIS — G114 Hereditary spastic paraplegia: Secondary | ICD-10-CM

## 2022-10-18 DIAGNOSIS — L89150 Pressure ulcer of sacral region, unstageable: Secondary | ICD-10-CM | POA: Insufficient documentation

## 2022-10-18 DIAGNOSIS — R532 Functional quadriplegia: Secondary | ICD-10-CM

## 2022-10-18 DIAGNOSIS — I5032 Chronic diastolic (congestive) heart failure: Secondary | ICD-10-CM

## 2022-10-18 NOTE — Progress Notes (Signed)
Provider:  Melvyn Novas, MD  Primary Care Physician:  Garlan Fillers, MD [address] [place] [address]     Referring Provider: Garlan Fillers, Md [address] [place],  [place] [address]          Chief Complaint according to patient   Patient presents with:     New Patient (Initial Visit)           HISTORY OF PRESENT ILLNESS:  [name] is a 82 y.o. male patient who is here for revisit 10/18/2022 for  transfer of care back from hospice.  Chief concern according to patient :  new PAP machine 12 to 20 cm water needed, 1 cm EPR. Uses 4 L of oxygen which were provided by hospice.    History of Present Illness: I have followed this gentleman for about 7 or 8 years by now.  He initially became my sleep patient because of a complex sleep apnea syndrome.  He was diagnosed with complex apnea and overlap COPD.  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  Muscle weakness was mostly seen on the right and a sensory neuropathy also developed over the years which further in stabilized or destabilized his gait.  He had surgery by Dr. Wynetta Emery for the spinal stenosis.  He remains with sensory neuropathy, complex sleep apnea syndrome and because of worsening of COPD he switched in October- November 2021 to  hospice care. He uses 4 L of 02 at home.  He is wheelchair bound by now, he does have significant loss of muscle mass in both lower extremities, the skin has become shiny and hairless.  He also has a chronic indwelling catheter.  With the switch from hospice there is also a switch of durable medical equipment company and he will also need a new CPAP machine the one that was last issued for him was ordered by hospice through [employer] and apparently cannot be continued at this time  and his previous CPAP machine is no longer working.  He use the machine through the months of December and January of this year with 100% compliance with an average of 6 hours at night.  This was a minimum pressure setting of 12 maximum pressure setting of 20 and an EPR level of 1 cm which would be a BiPAP setting.  His 95th percentile pressure used at night is only 11 cm water and his residual AHI was 2.6/h which is a good resolution.    However this machine is now broken and has not been able to be used since February.  I am needing to replace the machine and supplies-  I also would like for this patient to have a new sleep study but he is wheelchair-bound , which makes it very hard for him to sleep in the sleep lab. He is PAP dependent biPAP user.   He is unable to get sleep without BiPAP and therefor will not undergo HST -   I will reorder a new BiPAP machine ith similar settings, new mask  FFM , large, F 20, Resmed, adapt health.      Video visit 04-2021: last visit with this patient  complex sleep apnea and very sleepy, for 6-8 weeks, copd, on prednisone and still  sleeping.    Emergency room visit , at the [facility] location on 05-08-2021 - urinary catheter was replaced yesterday.     Baclofen, methocarbamol,  gabapentin, hydrocodone only at night.  Prescribed by hospice of  , oxygen on cpap at 4 liters.  The dose at night is 7.5 mg hydrocodone with 325 mg of acetaminophen.  Neurontin / gabapentin he is taking at 300 mg at night,  and baclofen 40 mg 4 times a day.     Observations/Objective:   Excellent compliance 100% of days 100% of time on CPAP between 12 and 20 cmH2O with 1 cm expiratory pressure relief.  Average user time 6 hours 58 minutes.  Residual events per hour are artificially escalated his AHI was 6.6 of which unknown apneas and hypopneas accounted for 5.4/h.  The supine Type of respiratory events are clearly related to high air leak and do not warrant  me.  The patient also receives 4 L of oxygen through his CPAP.  He is sleepy within 2-1/2 hours of getting up in the morning and seems to have now for the last 6 to 8 weeks with daily 2-hour nap between 10 and 12 AM.     02-05-2020,  I am meeting today with [name] , a meanwhile 82 year old patient of our sleep clinic. Our last conversation was on June 8th 2020 by virtual visit.  [name] his wife injured about a fractured both of her femurs in June so both Mr. and [name] are now in need of home health care and attention.  Over the year 2018 [name] had progressively lost more muscle tone and control of his lower extremities his knees buckled at the time without warning and had was diagnosed with a myelopathy concurrent with a spinal stenosis of the cervical region.  He also had been previously followed here already for complex sleep apnea syndrome.  He had myoclonic jerks and spasms that did not qualify anymore as restless legs but are much more severe than that.  He also has reported difficulties with controlling bladder and bowel and he now has a catheter.  He presents today in a wheelchair.  I would like to reiterate that in 2019 the patient was diagnosed with a cervical spine stenosis with anterior posterior diameter reduction to 5 mm causing spinal cord compression and myelopathic signal changes.  Severe bilateral foraminal narrowing.  Surgical decompression was performed by Dr. Wynetta Emery at the time.  He is now 18 months almost 20 months since his neurosurgical intervention he has resided for a while in a rehab facility.  In March 2021 he was readmitted to hospital for uncontrollable involuntary movements of the lower extremities he developed a bedsore in March followed by pneumonia and stayed a total of 8 weeks in the hospital.  Dr. Ervin Knack then performed a lumbar laminectomy L3-L4.  Myelopathy and severe stenosis had been found on the myelographic study.  It was at least of hope  that it would help the patient to regain strength in his lower extremities and maybe even continence..  This has so far not been the case.  He was discharged from rehab where he stayed from 24 May to 25 November 2019.  His legs feel asleep all the time, he can't stand - certainly can't walk. At home PT has quit. Has  Bedsore.   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  Also neurogenic  bowel and bladder, chronic diastolic congestive heart failure, chronic respiratory failure.           HPI: I had the pleasure of meeting today with Cesar Serve, MD 82 year old gentleman seen on 20 May 2018 after a 76-month hiatus. In my last visit the patient has reported difficulties with controlling his bladder and bowel, he had become very weak both lower extremities were affected but also he was leaning towards his right side and felt that there was weakness of the right upper extremity and torso.  I obtained first an MRI of the brain with and without contrast which did not show an onset of, there was moderate to moderate generalized cortical atrophy and 1 minimal chronic microvascular changes overall healthy for age.  Dr. Roselyn Bering then followed with a cervical spine MRI which showed severe spinal stenosis with an anterior posterior diameter of 5 mm, causing spinal cord compression and myelopathic signal changes within the spinal cord.  There were severe bilateral foraminal narrowing noticed especially at C4 there was more moderate stenosis between C4-5, again severe stenosis at C5 and C6, and again C6 and C7 had only mild spinal stenosis.  The patient was immediately referred for surgical decompression with Dr. Wynetta Emery neurosurgery.  There were also multilevel degenerative changes at the lower spine but none of this was comparable to the cervical spine findings.  He is now 7 month post surgical intervention and has had PT, rehab.  He is not in  pain, has less myoclonic jerks.     I have followed Mr. [name] mainly for a sleep disorder and he has been 100% compliant CPAP user using an AutoSet between 12 and 20 cmH2O was 2 cm EPR, he has a lots of air leaks which causes erroneously high apnea problems.  He also struggles with bronchitis and a productive cough and has a nasal septal deviation that makes it harder to breathe through the nose.  He endorsed today the fatigue severity scale at 50, the Epworth Sleepiness Scale at 20 out of 24 points and the geriatric depression scale at 7 out of 15 points. He has a bruised nasal bridge which comes as a result from wearing the mask too tightly.           ABIEL ANTRIM is a 82 y.o. male is seen here today, 07-12-2017 , for intermittent leg jerking movements as well as irregular loss of muscle tone and control of the lower extremities.  His knees may buckle without warning.  Other times he was able to walk.  He is currently residing in a nursing facility and the goal is to get him home, but his rehab has been hampered by the unpredictable exercise tolerance and participation. Around Christmas time he suffered a urinary tract infection which set him back quite a bit, on January 2 he was admitted to hospital he required IV antibiotics, on the fifth he was discharged from hospital to a rehab-nursing home, where he still stays.   While his admission diagnosis was a urosepsis-urinary tract infection he will now need a new diagnosis to continue with rehab.  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  He had 2 replacement of the left knee with in a month, dating back to the year 2012 be relieved.  There has been no surgery to the right leg neither hip nor knee have been no back surgeries or spinal interventions. The myoclonic jerks are much more dominantly expressed with the right lower  extremity, but his knees buckle on both sides but not necessarily at the same time. His feet  don't lift well of the  ground, especially the right.        01 May 2017.  He has again been a very compliant CPAP use of his 100% of the last 30 days daily use and 80% by hours.  Average use at times 4 hours 42 minutes, the patient uses an AutoSet between 12 and 18 cmH2O was wanted me to EPR is a residual AHI of 11.1.  The vast majority of these residual apneas are obstructive in nature usually means that he does need a little bit more pressure.  The 95th percentile pressure was 17.5 cm water.  I would like to increase the maximum pressure to 20 cm was 2 cm EPR.  He will stay on the memory foam lined mask the so-called air touch interface.  He likes it and it has reduced air leaks significantly.  He reports that his spouse still feels that there is too much air leak however it has not woken him up she is waking him up.  His fatigue severity score today was endorsed to 22 points but his Epworth sleepiness score is still high at 15 points, the geriatric depression score was endorsed at only 1 out of 15 points. He is happy with his results, more alertness.      10-30-2016, [name] has been, as usual, and excellent compliant patient. He has used CPAP was 90% compliance and an average of 5 hours and 90 minutes daily, uses an AutoSet between 12 and 18 cm water was 1 cm EPR and hasn't residual apnea index of 5.8. This is a little higher than I like it's but I think it is related to higher air leaks. The 95th percentile pressure was 17.1, which is an unusual high pressure needs. The air leaks were remarkably high. The patient sometimes is aware at night that there is an air leak but not every night I would like for him to be refitted to a different interface. I would like him to try an air touch fullface mask, which has no resting point on the forehead. He also endorsed 16 points on his Epworth Sleepiness Scale which is too high, and 43 points on the fatigue.  Review of Systems: Out of a complete 14 system review, the patient  complains of only the following symptoms, and all other reviewed systems are negative.:  Fatigue, sleepiness    How likely are you to doze in the following situations: 0 = not likely, 1 = slight chance, 2 = moderate chance, 3 = high chance   Sitting and Reading? Watching Television? Sitting inactive in a public place (theater or meeting)? As a passenger in a car for an hour without a break? Lying down in the afternoon when circumstances permit? Sitting and talking to someone? Sitting quietly after lunch without alcohol? In a car, while stopped for a few minutes in traffic?   Total = 23/ 24 points   FSS endorsed at 49/ 63 points.   Social History   Socioeconomic History   Marital status: Married    Spouse name: [name]   Number of children: 2   Years of education: COLLEGE   Highest education level: Not on file  Occupational History    Employer: RETIRED  Tobacco Use   Smoking status: Former    Years: 40    Types: Cigars, Cigarettes    Quit date: 06/19/2005  Years since quitting: 17.3   Smokeless tobacco: Never   Tobacco comments:    smoked 4-6 cigars daily, only smoked cigarettes socially  Vaping Use   Vaping Use: Never used  Substance and Sexual Activity   Alcohol use: No   Drug use: No   Sexual activity: Not on file  Other Topics Concern   Not on file  Social History Narrative   Patient is married [name]) and lives at home with his wife.   Patient has two children.   Patient is retired.   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   Patient is right-handed.   Patient drinks three cups of coffee per day.   Social Determinants of Health   Financial Resource Strain: Not on file  Food Insecurity: Not on file  Transportation Needs: Not on file  Physical Activity: Not on file  Stress: Not on file  Social Connections: Not on file    Family History  Problem Relation Age of Onset   COPD Mother    Hyperlipidemia Mother    Hypertension Mother     Diabetes Father    Kidney disease Father        ESRD   Hypertension Father    Cancer Father    Hyperlipidemia Father     Past Medical History:  Diagnosis Date   Arthritis    Barrett esophagus    Bronchitis    Carotid artery occlusion    COPD (chronic obstructive pulmonary disease) (HCC)    Diabetes mellitus without complication (HCC)    GERD (gastroesophageal reflux disease)    Headache    migraines   History of thyroid cancer 2008   Hypertension    Restless leg    Sleep apnea with use of continuous positive airway pressure (CPAP)    [facility] , AHI  77cn central and obstrcutive. 16 cm water , 3 cm EPR.    Thoracic ascending aortic aneurysm (HCC)    4.2 cm ascending TAA 08/18/17 CTA. Annual imaging recommended.   thyroid ca dx'd 2009 or 2010   surg and radioactive isoptope   Ulcer    Peptic ulcer disease    Past Surgical History:  Procedure Laterality Date   ANTERIOR CERVICAL DECOMP/DISCECTOMY FUSION N/A 09/03/2017   Procedure: ACDF - C3-C4 - C4-C5 - C5-C6;  Surgeon: Donalee Citrin, MD;  Location: [facility] OR;  Service: Neurosurgery;  Laterality: N/A;   CAROTID ENDARTERECTOMY Left January 03, 2006   Dr. Madilyn Fireman   COLONOSCOPY     EYE SURGERY Bilateral    cataract surgery with lens implants   game keepers thumb Right    JOINT REPLACEMENT Left    knee X 2   LUMBAR LAMINECTOMY/ DECOMPRESSION WITH MET-RX N/A 11/04/2019   Procedure: LUMBAR THREE-FOUR LUMBAR LAMINECTOMY/ DECOMPRESSION WITH MET-RX;  Surgeon: Jadene Pierini, MD;  Location: [facility] OR;  Service: Neurosurgery;  Laterality: N/A;   RADIOLOGY WITH ANESTHESIA N/A 10/16/2019   Procedure: MRI THORACIC LUMBAR SPINE;  Surgeon: Radiologist, Medication, MD;  Location: [facility] OR;  Service: Radiology;  Laterality: N/A;   ROTATOR CUFF REPAIR Right    THYROIDECTOMY  2008     Current Outpatient Medications on File Prior to Visit  Medication Sig Dispense Refill   acetaminophen (TYLENOL) 325 MG tablet Take 2 tablets (650 mg total) by  mouth every 6 (six) hours as needed (pain or temperature).     albuterol (VENTOLIN HFA) 108 (90 Base) MCG/ACT inhaler Inhale 1 puff into the  lungs every 4 (four) hours as needed for wheezing or shortness of breath. 6.7 g 1   ATIVAN 0.5 MG tablet Take 0.5 mg by mouth every 4 (four) hours as needed.     baclofen (LIORESAL) 20 MG tablet Take 2 tablets (40 mg total) by mouth 4 (four) times daily. 240 tablet 5   diclofenac (VOLTAREN) 50 MG EC tablet Take 1 tablet (50 mg total) by mouth daily as needed. 30 tablet 1   furosemide (LASIX) 40 MG tablet TAKE 1 TABLET (40 MG TOTAL) BY MOUTH DAILY. PATIENT NEEDS AN APPOINTMENT FOR FUTURE REFILLS. 15 tablet 1   gabapentin (NEURONTIN) 100 MG capsule Take 100 mg by mouth 3 (three) times daily.     gabapentin (NEURONTIN) 300 MG capsule Take 1 capsule (300 mg total) by mouth at bedtime. 90 capsule 1   guaiFENesin (MUCINEX) 600 MG 12 hr tablet Take 2 tablets (1,200 mg total) by mouth 2 (two) times daily. 120 tablet 1   HYDROcodone-acetaminophen (NORCO) 7.5-325 MG tablet Take 1 tablet by mouth every 6 (six) hours as needed. Taking over from Hospice 120 tablet 0   hyoscyamine (LEVSIN SL) 0.125 MG SL tablet SMARTSIG:1-2 Tablet(s) Sublingual Every 4 Hours     ipratropium-albuterol (DUONEB) 0.5-2.5 (3) MG/3ML SOLN SMARTSIG:1 Vial(s) Via Nebulizer     levothyroxine (SYNTHROID) 175 MCG tablet Take 175 mcg by mouth daily.     methocarbamol (ROBAXIN) 750 MG tablet TAKE 1 TABLET (750 MG TOTAL) BY MOUTH 4 (FOUR) TIMES DAILY. 120 tablet 5   Morphine Sulfate (MORPHINE CONCENTRATE) 10 mg / 0.5 ml concentrated solution      nitroGLYCERIN (NITROSTAT) 0.4 MG SL tablet Place 0.4 mg under the tongue every 5 (five) minutes as needed for chest pain.     pantoprazole (PROTONIX) 40 MG tablet Take 1 tablet (40 mg total) by mouth every evening. (Patient taking differently: Take 40 mg by mouth daily as needed (indigestion).) 30 tablet 0   polyethylene glycol powder (MIRALAX) 17 GM/SCOOP powder  Take 0.5 Containers by mouth daily. 17 gm every other day     rizatriptan (MAXALT) 10 MG tablet 10 mg AS NEEDED (route: oral)     senna-docusate (SENOKOT-S) 8.6-50 MG tablet Take 2 tablets by mouth 2 (two) times daily. (Patient taking differently: Take 2 tablets by mouth at bedtime.)     spironolactone (ALDACTONE) 25 MG tablet 25 mg DAILY  (route: oral)     tiZANidine (ZANAFLEX) 4 MG tablet Take 0.5-1 tablets (2-4 mg total) by mouth 2 (two) times daily as needed for muscle spasms. In addition to Robaxin and baclofen- due to SCI 60 tablet 5   Travoprost, BAK Free, (TRAVATAN) 0.004 % SOLN ophthalmic solution 1 drop at bedtime.     traZODone (DESYREL) 50 MG tablet Take 50 mg by mouth at bedtime.     TRELEGY ELLIPTA 100-62.5-25 MCG/INH AEPB Inhale 1 puff into the lungs daily.   4   No current facility-administered medications on file prior to visit.    Allergies  Allergen Reactions   Ciprofloxacin     Other reaction(s): Other (See Comments) Drug interactions     DIAGNOSTIC DATA (LABS, IMAGING, TESTING) - I reviewed patient records, labs, notes, testing and imaging myself where available.  Lab Results  Component Value Date   WBC 14.5 (H) 04/05/2020   HGB 13.8 04/05/2020   HCT 44.4 04/05/2020   MCV 86.5 04/05/2020   PLT 291 04/05/2020      Component Value Date/Time   NA 143 04/05/2020  1554   NA 146 (H) 04/07/2019 1426   K 4.0 04/05/2020 1554   CL 97 (L) 04/05/2020 1554   CO2 33 (H) 04/05/2020 1554   GLUCOSE 89 04/05/2020 1554   BUN 25 (H) 04/05/2020 1554   BUN 22 04/07/2019 1426   CREATININE 0.84 04/05/2020 1554   CALCIUM 9.2 04/05/2020 1554   PROT 7.2 04/05/2020 1554   ALBUMIN 3.8 04/05/2020 1554   AST 13 (L) 04/05/2020 1554   ALT 6 04/05/2020 1554   ALKPHOS 90 04/05/2020 1554   BILITOT 0.6 04/05/2020 1554   GFRNONAA >60 04/05/2020 1554   GFRAA >60 03/05/2020 0403   Lab Results  Component Value Date   TRIG 238 (H) 02/19/2020   Lab Results  Component Value Date    HGBA1C 5.6 02/19/2020   No results found for: "VITAMINB12" Lab Results  Component Value Date   TSH 0.223 (L) 09/17/2019    PHYSICAL EXAM:  Today's Vitals   10/18/22 1353  BP: (!) 104/59  Pulse: 79  Weight: 165 lb (74.8 kg)  Height: 5\' 7"  (1.702 m)   Body mass index is 25.84 kg/m.   Wt Readings from Last 3 Encounters:  10/18/22 165 lb (74.8 kg)  10/13/22 169 lb (76.7 kg)  08/28/22 160 lb (72.6 kg)     Ht Readings from Last 3 Encounters:  10/18/22 5\' 7"  (1.702 m)  10/13/22 5\' 7"  (1.702 m)  08/28/22 5\' 7"  (1.702 m)      General: The patient is awake, alert and appears not in acute distress. Head: Normocephalic, atraumatic. Neck is tender,  tilted to the right .Mallampati 3,  neck circumference:18.5  inches . Nasal airflow  patent.   facial hair  Cardiovascular:  Regular rate and cardiac rhythm by pulse,  without distended neck veins. Respiratory: Lungs are clear to auscultation.  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  Trunk: The patient's posture is slumped    NEUROLOGIC EXAM: The patient is awake and alert, oriented to place and time.   Memory subjective described as intact.  Attention span & concentration ability appears normal.  Speech is fluent,  without  dysarthria, dysphonia or aphasia.  Mood and affect are appropriate.   Cranial nerves: no loss of smell or taste reported  Pupils are equal and briskly reactive to light. Funduscopic exam .  Extraocular movements in vertical and horizontal planes were intact and without nystagmus. No Diplopia. Visual fields by finger perimetry are intact. Hearing was intact to soft voice and finger rubbing.    Facial sensation intact to fine touch.  Facial motor strength is symmetric and tongue and uvula move midline.  Neck ROM : rotation, tilt and flexion extension were normal for age and  shoulder shrug was symmetrical.    Motor exam:  Symmetric bulk, tone and ROM in his upper extremities. .   Rigidity in both legs- unable to actively extend at knee or at hip or at ankle.    Sensory:  Fine touch, pinprick and vibration were tested  and  normal in his hands, but all impaired in his legs.     Coordination: Rapid alternating movements in the fingers/hands were of normal speed.  Gait and station: wheelchair  Deep tendon reflexes: deferred.    ASSESSMENT AND PLAN 82 y.o. year old male with COPD, Central and complex apnea,  paraplegia, neuropathy and spinal stenosis, incontinence and wheelchair bound,  02 dependent with:    1) This was a minimum pressure setting of 12 maximum pressure setting of 20 and an EPR level of 1 cm which would be a BiPAP setting.  His 95th percentile pressure used at night is only 11 cm water and his residual AHI was 2.6/h which is a good resolution.  4 liters of 02 are brled into the PAP each night. Sleeps in a hospital bed.   However this machine is now broken and has not been able to be used since February.  I am needing to replace the machine and supplies-   I also would have liked for this patient to have a new sleep study but he is wheelchair-bound , which makes it very hard for him to sleep in the sleep lab. He is PAP dependent user.   He is unable to get sleep without PAP and therefor will not undergo HST -  he has home health arranged now.   I will reorder a new PAP machine with similar settings, new mask  FFM , large, F 20, Resmed, adapt health.  Oxygen order came from [facility] - and will be continued by dr Eloise Harman    I plan to follow up through our NP within 3-4 months.   I would like to thank Garlan Fillers, MD , Dr Simone Curia for allowing me to meet with and to take care of this pleasant patient.     After spending a total time of  40  minutes face to face and additional time for physical and neurologic  examination, review of laboratory studies,  personal review of imaging studies, reports and results of other testing and review of referral information / records as far as provided in visit,   Electronically signed by: Melvyn Novas, MD 10/18/2022 2:01 PM  [facility] Board certified by The [employer] of Sleep Medicine and Diplomate of the [employer] of Sleep Medicine. Board certified In Neurology through the ABPN, Fellow of the [employer] of Neurology. [facility] is an Clinical cytogeneticist.

## 2022-10-18 NOTE — Telephone Encounter (Signed)
Per Mrs. Michail Jewels: Mr. Borgen has purchased a hospital bed. Now he only needs the suction pump from [employer]. [employer] phone number has been given for follow up on the order [phone]. Also to contact his PCP or specialist for any  special needs we do not supply.

## 2022-10-29 ENCOUNTER — Inpatient Hospital Stay (HOSPITAL_COMMUNITY): Payer: Medicare Other

## 2022-10-29 ENCOUNTER — Emergency Department (HOSPITAL_COMMUNITY): Payer: Medicare Other

## 2022-10-29 ENCOUNTER — Inpatient Hospital Stay (HOSPITAL_COMMUNITY)
Admission: EM | Admit: 2022-10-29 | Discharge: 2022-11-18 | DRG: 871 | Disposition: E | Payer: Medicare Other | Attending: Internal Medicine | Admitting: Internal Medicine

## 2022-10-29 ENCOUNTER — Encounter (HOSPITAL_COMMUNITY): Payer: Self-pay

## 2022-10-29 ENCOUNTER — Other Ambulatory Visit: Payer: Self-pay

## 2022-10-29 DIAGNOSIS — Z66 Do not resuscitate: Secondary | ICD-10-CM | POA: Diagnosis present

## 2022-10-29 DIAGNOSIS — A419 Sepsis, unspecified organism: Principal | ICD-10-CM

## 2022-10-29 DIAGNOSIS — Z96653 Presence of artificial knee joint, bilateral: Secondary | ICD-10-CM | POA: Diagnosis present

## 2022-10-29 DIAGNOSIS — Z79891 Long term (current) use of opiate analgesic: Secondary | ICD-10-CM

## 2022-10-29 DIAGNOSIS — J9601 Acute respiratory failure with hypoxia: Secondary | ICD-10-CM

## 2022-10-29 DIAGNOSIS — I7121 Aneurysm of the ascending aorta, without rupture: Secondary | ICD-10-CM | POA: Diagnosis present

## 2022-10-29 DIAGNOSIS — M1990 Unspecified osteoarthritis, unspecified site: Secondary | ICD-10-CM | POA: Diagnosis present

## 2022-10-29 DIAGNOSIS — Z8249 Family history of ischemic heart disease and other diseases of the circulatory system: Secondary | ICD-10-CM

## 2022-10-29 DIAGNOSIS — J690 Pneumonitis due to inhalation of food and vomit: Secondary | ICD-10-CM | POA: Diagnosis present

## 2022-10-29 DIAGNOSIS — Z7189 Other specified counseling: Secondary | ICD-10-CM | POA: Diagnosis not present

## 2022-10-29 DIAGNOSIS — L89322 Pressure ulcer of left buttock, stage 2: Secondary | ICD-10-CM | POA: Diagnosis present

## 2022-10-29 DIAGNOSIS — R6521 Severe sepsis with septic shock: Secondary | ICD-10-CM | POA: Diagnosis present

## 2022-10-29 DIAGNOSIS — M62838 Other muscle spasm: Secondary | ICD-10-CM | POA: Diagnosis not present

## 2022-10-29 DIAGNOSIS — R443 Hallucinations, unspecified: Secondary | ICD-10-CM | POA: Diagnosis not present

## 2022-10-29 DIAGNOSIS — R627 Adult failure to thrive: Secondary | ICD-10-CM | POA: Diagnosis present

## 2022-10-29 DIAGNOSIS — Z87891 Personal history of nicotine dependence: Secondary | ICD-10-CM

## 2022-10-29 DIAGNOSIS — Z833 Family history of diabetes mellitus: Secondary | ICD-10-CM

## 2022-10-29 DIAGNOSIS — G8222 Paraplegia, incomplete: Secondary | ICD-10-CM | POA: Diagnosis present

## 2022-10-29 DIAGNOSIS — E440 Moderate protein-calorie malnutrition: Secondary | ICD-10-CM | POA: Diagnosis not present

## 2022-10-29 DIAGNOSIS — J189 Pneumonia, unspecified organism: Secondary | ICD-10-CM | POA: Diagnosis not present

## 2022-10-29 DIAGNOSIS — R1313 Dysphagia, pharyngeal phase: Secondary | ICD-10-CM | POA: Diagnosis present

## 2022-10-29 DIAGNOSIS — Z1152 Encounter for screening for COVID-19: Secondary | ICD-10-CM | POA: Diagnosis not present

## 2022-10-29 DIAGNOSIS — G9349 Other encephalopathy: Secondary | ICD-10-CM | POA: Diagnosis present

## 2022-10-29 DIAGNOSIS — E890 Postprocedural hypothyroidism: Secondary | ICD-10-CM | POA: Diagnosis present

## 2022-10-29 DIAGNOSIS — Z981 Arthrodesis status: Secondary | ICD-10-CM

## 2022-10-29 DIAGNOSIS — G8929 Other chronic pain: Secondary | ICD-10-CM | POA: Diagnosis present

## 2022-10-29 DIAGNOSIS — F05 Delirium due to known physiological condition: Secondary | ICD-10-CM | POA: Diagnosis not present

## 2022-10-29 DIAGNOSIS — J9602 Acute respiratory failure with hypercapnia: Secondary | ICD-10-CM | POA: Diagnosis not present

## 2022-10-29 DIAGNOSIS — J441 Chronic obstructive pulmonary disease with (acute) exacerbation: Secondary | ICD-10-CM | POA: Diagnosis present

## 2022-10-29 DIAGNOSIS — L89152 Pressure ulcer of sacral region, stage 2: Secondary | ICD-10-CM | POA: Diagnosis present

## 2022-10-29 DIAGNOSIS — Z923 Personal history of irradiation: Secondary | ICD-10-CM

## 2022-10-29 DIAGNOSIS — Z881 Allergy status to other antibiotic agents status: Secondary | ICD-10-CM

## 2022-10-29 DIAGNOSIS — E876 Hypokalemia: Secondary | ICD-10-CM | POA: Diagnosis present

## 2022-10-29 DIAGNOSIS — J9690 Respiratory failure, unspecified, unspecified whether with hypoxia or hypercapnia: Secondary | ICD-10-CM | POA: Diagnosis present

## 2022-10-29 DIAGNOSIS — Z79890 Hormone replacement therapy (postmenopausal): Secondary | ICD-10-CM

## 2022-10-29 DIAGNOSIS — J9622 Acute and chronic respiratory failure with hypercapnia: Secondary | ICD-10-CM | POA: Diagnosis present

## 2022-10-29 DIAGNOSIS — K219 Gastro-esophageal reflux disease without esophagitis: Secondary | ICD-10-CM | POA: Diagnosis present

## 2022-10-29 DIAGNOSIS — Z85850 Personal history of malignant neoplasm of thyroid: Secondary | ICD-10-CM

## 2022-10-29 DIAGNOSIS — Z79899 Other long term (current) drug therapy: Secondary | ICD-10-CM

## 2022-10-29 DIAGNOSIS — Z825 Family history of asthma and other chronic lower respiratory diseases: Secondary | ICD-10-CM

## 2022-10-29 DIAGNOSIS — I10 Essential (primary) hypertension: Secondary | ICD-10-CM | POA: Diagnosis present

## 2022-10-29 DIAGNOSIS — R1314 Dysphagia, pharyngoesophageal phase: Secondary | ICD-10-CM | POA: Diagnosis not present

## 2022-10-29 DIAGNOSIS — I4891 Unspecified atrial fibrillation: Secondary | ICD-10-CM | POA: Diagnosis not present

## 2022-10-29 DIAGNOSIS — R6520 Severe sepsis without septic shock: Secondary | ICD-10-CM | POA: Diagnosis present

## 2022-10-29 DIAGNOSIS — Z515 Encounter for palliative care: Secondary | ICD-10-CM

## 2022-10-29 DIAGNOSIS — Z6827 Body mass index (BMI) 27.0-27.9, adult: Secondary | ICD-10-CM

## 2022-10-29 DIAGNOSIS — K2270 Barrett's esophagus without dysplasia: Secondary | ICD-10-CM | POA: Diagnosis present

## 2022-10-29 DIAGNOSIS — G2581 Restless legs syndrome: Secondary | ICD-10-CM | POA: Diagnosis present

## 2022-10-29 DIAGNOSIS — Z9981 Dependence on supplemental oxygen: Secondary | ICD-10-CM

## 2022-10-29 DIAGNOSIS — Z22322 Carrier or suspected carrier of Methicillin resistant Staphylococcus aureus: Secondary | ICD-10-CM

## 2022-10-29 DIAGNOSIS — E11649 Type 2 diabetes mellitus with hypoglycemia without coma: Secondary | ICD-10-CM | POA: Diagnosis not present

## 2022-10-29 DIAGNOSIS — R441 Visual hallucinations: Secondary | ICD-10-CM | POA: Diagnosis not present

## 2022-10-29 DIAGNOSIS — L89312 Pressure ulcer of right buttock, stage 2: Secondary | ICD-10-CM | POA: Diagnosis present

## 2022-10-29 DIAGNOSIS — Z961 Presence of intraocular lens: Secondary | ICD-10-CM | POA: Diagnosis present

## 2022-10-29 DIAGNOSIS — J9621 Acute and chronic respiratory failure with hypoxia: Secondary | ICD-10-CM | POA: Diagnosis not present

## 2022-10-29 DIAGNOSIS — R001 Bradycardia, unspecified: Secondary | ICD-10-CM | POA: Diagnosis present

## 2022-10-29 DIAGNOSIS — Z993 Dependence on wheelchair: Secondary | ICD-10-CM

## 2022-10-29 DIAGNOSIS — Z841 Family history of disorders of kidney and ureter: Secondary | ICD-10-CM

## 2022-10-29 DIAGNOSIS — G4733 Obstructive sleep apnea (adult) (pediatric): Secondary | ICD-10-CM | POA: Diagnosis present

## 2022-10-29 DIAGNOSIS — R54 Age-related physical debility: Secondary | ICD-10-CM | POA: Diagnosis present

## 2022-10-29 LAB — GLUCOSE, CAPILLARY
Glucose-Capillary: 105 mg/dL — ABNORMAL HIGH (ref 70–99)
Glucose-Capillary: 92 mg/dL (ref 70–99)
Glucose-Capillary: 114 mg/dL — ABNORMAL HIGH (ref 70–99)

## 2022-10-29 LAB — COMPREHENSIVE METABOLIC PANEL
ALT: 12 U/L (ref 0–44)
AST: 18 U/L (ref 15–41)
Albumin: 3.9 g/dL (ref 3.5–5.0)
Alkaline Phosphatase: 77 U/L (ref 38–126)
Anion gap: 14 (ref 5–15)
BUN: 19 mg/dL (ref 8–23)
CO2: 35 mmol/L — ABNORMAL HIGH (ref 22–32)
Calcium: 9.5 mg/dL (ref 8.9–10.3)
Chloride: 91 mmol/L — ABNORMAL LOW (ref 98–111)
Creatinine, Ser: 0.7 mg/dL (ref 0.61–1.24)
GFR, Estimated: >60 mL/min (ref >60)
Glucose, Bld: 197 mg/dL — ABNORMAL HIGH (ref 70–99)
Potassium: 5.1 mmol/L (ref 3.5–5.1)
Sodium: 140 mmol/L (ref 135–145)
Total Bilirubin: 0.5 mg/dL (ref 0.3–1.2)
Total Protein: 7.9 g/dL (ref 6.5–8.1)

## 2022-10-29 LAB — HEMOGLOBIN A1C
Hgb A1c MFr Bld: 5.8 % — ABNORMAL HIGH (ref 4.8–5.6)
Mean Plasma Glucose: 119.76 mg/dL

## 2022-10-29 LAB — POCT I-STAT 7, (LYTES, BLD GAS, ICA,H+H)
Acid-Base Excess: 11 mmol/L — ABNORMAL HIGH (ref 0.0–2.0)
Acid-Base Excess: 9 mmol/L — ABNORMAL HIGH (ref 0.0–2.0)
Bicarbonate: 33.3 mmol/L — ABNORMAL HIGH (ref 20.0–28.0)
Bicarbonate: 33.2 mmol/L — ABNORMAL HIGH (ref 20.0–28.0)
Calcium, Ion: 1.16 mmol/L (ref 1.15–1.40)
Calcium, Ion: 1.21 mmol/L (ref 1.15–1.40)
HCT: 39 % (ref 39.0–52.0)
HCT: 37 % — ABNORMAL LOW (ref 39.0–52.0)
Hemoglobin: 13.3 g/dL (ref 13.0–17.0)
Hemoglobin: 12.6 g/dL — ABNORMAL LOW (ref 13.0–17.0)
O2 Saturation: 99 %
O2 Saturation: 98 %
Patient temperature: 99.1
Potassium: 4.1 mmol/L (ref 3.5–5.1)
Potassium: 4 mmol/L (ref 3.5–5.1)
Sodium: 138 mmol/L (ref 135–145)
Sodium: 139 mmol/L (ref 135–145)
TCO2: 34 mmol/L — ABNORMAL HIGH (ref 22–32)
TCO2: 34 mmol/L — ABNORMAL HIGH (ref 22–32)
pCO2 arterial: 36.3 mmHg (ref 32–48)
pCO2 arterial: 43.2 mmHg (ref 32–48)
pH, Arterial: 7.571 — ABNORMAL HIGH (ref 7.35–7.45)
pH, Arterial: 7.494 — ABNORMAL HIGH (ref 7.35–7.45)
pO2, Arterial: 103 mmHg (ref 83–108)
pO2, Arterial: 100 mmHg (ref 83–108)

## 2022-10-29 LAB — CBC WITH DIFFERENTIAL/PLATELET
Abs Immature Granulocytes: 0.78 10*3/uL — ABNORMAL HIGH (ref 0.00–0.07)
Basophils Absolute: 0.1 10*3/uL (ref 0.0–0.1)
Basophils Relative: 1 %
Eosinophils Absolute: 0.2 10*3/uL (ref 0.0–0.5)
Eosinophils Relative: 1 %
HCT: 46 % (ref 39.0–52.0)
Hemoglobin: 13.5 g/dL (ref 13.0–17.0)
Immature Granulocytes: 5 %
Lymphocytes Relative: 8 %
Lymphs Abs: 1.5 10*3/uL (ref 0.7–4.0)
MCH: 26.7 pg (ref 26.0–34.0)
MCHC: 29.3 g/dL — ABNORMAL LOW (ref 30.0–36.0)
MCV: 90.9 fL (ref 80.0–100.0)
Monocytes Absolute: 0.7 10*3/uL (ref 0.1–1.0)
Monocytes Relative: 4 %
Neutro Abs: 14.4 10*3/uL — ABNORMAL HIGH (ref 1.7–7.7)
Neutrophils Relative %: 81 %
Platelets: 294 10*3/uL (ref 150–400)
RBC: 5.06 MIL/uL (ref 4.22–5.81)
RDW: 15.4 % (ref 11.5–15.5)
WBC: 17.5 10*3/uL — ABNORMAL HIGH (ref 4.0–10.5)
nRBC: 0 % (ref 0.0–0.2)

## 2022-10-29 LAB — RESP PANEL BY RT-PCR (RSV, FLU A&B, COVID)  RVPGX2
Influenza A by PCR: NEGATIVE
Influenza B by PCR: NEGATIVE
Resp Syncytial Virus by PCR: NEGATIVE
SARS Coronavirus 2 by RT PCR: NEGATIVE

## 2022-10-29 LAB — TROPONIN I (HIGH SENSITIVITY)
Troponin I (High Sensitivity): 57 ng/L — ABNORMAL HIGH (ref <18)
Troponin I (High Sensitivity): 268 ng/L (ref <18)

## 2022-10-29 LAB — I-STAT ARTERIAL BLOOD GAS, ED
Acid-Base Excess: 6 mmol/L — ABNORMAL HIGH (ref 0.0–2.0)
Bicarbonate: 37 mmol/L — ABNORMAL HIGH (ref 20.0–28.0)
Calcium, Ion: 1.24 mmol/L (ref 1.15–1.40)
HCT: 40 % (ref 39.0–52.0)
Hemoglobin: 13.6 g/dL (ref 13.0–17.0)
O2 Saturation: 99 %
Potassium: 4.5 mmol/L (ref 3.5–5.1)
Sodium: 138 mmol/L (ref 135–145)
TCO2: 40 mmol/L — ABNORMAL HIGH (ref 22–32)
pCO2 arterial: 86.6 mmHg (ref 32–48)
pH, Arterial: 7.238 — ABNORMAL LOW (ref 7.35–7.45)
pO2, Arterial: 165 mmHg — ABNORMAL HIGH (ref 83–108)

## 2022-10-29 LAB — PROTIME-INR
INR: 1 (ref 0.8–1.2)
Prothrombin Time: 13.5 seconds (ref 11.4–15.2)

## 2022-10-29 LAB — URINALYSIS, W/ REFLEX TO CULTURE (INFECTION SUSPECTED)
Glucose, UA: NEGATIVE mg/dL
Ketones, ur: 5 mg/dL — AB
Nitrite: NEGATIVE
Protein, ur: >=300 mg/dL — AB
Specific Gravity, Urine: 1.025 (ref 1.005–1.030)
WBC, UA: >50 WBC/hpf (ref 0–5)
pH: 5 (ref 5.0–8.0)

## 2022-10-29 LAB — APTT: aPTT: 29 seconds (ref 24–36)

## 2022-10-29 LAB — URINE CULTURE

## 2022-10-29 LAB — LACTIC ACID, PLASMA
Lactic Acid, Venous: 0.9 mmol/L (ref 0.5–1.9)
Lactic Acid, Venous: 1.7 mmol/L (ref 0.5–1.9)

## 2022-10-29 LAB — CBG MONITORING, ED: Glucose-Capillary: 194 mg/dL — ABNORMAL HIGH (ref 70–99)

## 2022-10-29 LAB — BRAIN NATRIURETIC PEPTIDE: B Natriuretic Peptide: 111.1 pg/mL — ABNORMAL HIGH (ref 0.0–100.0)

## 2022-10-29 LAB — STREP PNEUMONIAE URINARY ANTIGEN: Strep Pneumo Urinary Antigen: NEGATIVE

## 2022-10-29 LAB — MRSA NEXT GEN BY PCR, NASAL: MRSA by PCR Next Gen: DETECTED — AB

## 2022-10-29 MED ORDER — VANCOMYCIN HCL 1750 MG/350ML IV SOLN
1750.0000 mg | INTRAVENOUS | Status: DC
  Administered 2022-10-29 – 2022-10-30 (×2): 1750 mg via INTRAVENOUS
  Filled 2022-10-29 (×3): qty 350

## 2022-10-29 MED ORDER — FENTANYL CITRATE PF 50 MCG/ML IJ SOSY
25.0000 ug | PREFILLED_SYRINGE | INTRAMUSCULAR | Status: DC | PRN
  Administered 2022-10-29 – 2022-10-30 (×5): 50 ug via INTRAVENOUS
  Filled 2022-10-29 (×5): qty 1

## 2022-10-29 MED ORDER — MIDAZOLAM HCL 2 MG/2ML IJ SOLN
1.0000 mg | INTRAMUSCULAR | Status: DC | PRN
  Administered 2022-10-29: 2 mg via INTRAVENOUS
  Filled 2022-10-29: qty 2

## 2022-10-29 MED ORDER — POLYETHYLENE GLYCOL 3350 17 G PO PACK: 17.0000 g | PACK | Freq: Every day | ORAL | Status: DC | PRN

## 2022-10-29 MED ORDER — SODIUM CHLORIDE 0.9 % IV SOLN
250.0000 mL | INTRAVENOUS | Status: DC
  Administered 2022-10-29: 250 mL via INTRAVENOUS

## 2022-10-29 MED ORDER — FENTANYL CITRATE PF 50 MCG/ML IJ SOSY: 25.0000 ug | PREFILLED_SYRINGE | INTRAMUSCULAR | Status: DC | PRN

## 2022-10-29 MED ORDER — ORAL CARE MOUTH RINSE
15.0000 mL | OROMUCOSAL | Status: DC
  Administered 2022-10-29 – 2022-10-30 (×7): 15 mL via OROMUCOSAL

## 2022-10-29 MED ORDER — DEXMEDETOMIDINE HCL IN NACL 400 MCG/100ML IV SOLN
INTRAVENOUS | Status: AC
  Filled 2022-10-29: qty 100

## 2022-10-29 MED ORDER — SODIUM CHLORIDE 0.9 % IV SOLN
2.0000 g | INTRAVENOUS | Status: AC
  Administered 2022-10-29 – 2022-11-02 (×5): 2 g via INTRAVENOUS
  Filled 2022-10-29 (×5): qty 20

## 2022-10-29 MED ORDER — PANTOPRAZOLE SODIUM 40 MG IV SOLR
40.0000 mg | Freq: Every day | INTRAVENOUS | Status: DC
  Administered 2022-10-29 – 2022-10-31 (×3): 40 mg via INTRAVENOUS
  Filled 2022-10-29 (×3): qty 10

## 2022-10-29 MED ORDER — ONDANSETRON HCL 4 MG/2ML IJ SOLN: 4.0000 mg | Freq: Four times a day (QID) | INTRAMUSCULAR | Status: DC | PRN

## 2022-10-29 MED ORDER — FENTANYL CITRATE PF 50 MCG/ML IJ SOSY
25.0000 ug | PREFILLED_SYRINGE | INTRAMUSCULAR | Status: DC | PRN
  Filled 2022-10-29: qty 1

## 2022-10-29 MED ORDER — ACETAMINOPHEN 325 MG PO TABS: 650.0000 mg | ORAL_TABLET | Freq: Four times a day (QID) | ORAL | Status: DC | PRN

## 2022-10-29 MED ORDER — SODIUM CHLORIDE 0.9% FLUSH: 10.0000 mL | INTRAVENOUS | Status: DC | PRN

## 2022-10-29 MED ORDER — INSULIN ASPART 100 UNIT/ML IJ SOLN
0.0000 [IU] | INTRAMUSCULAR | Status: DC
  Administered 2022-10-30: 2 [IU] via SUBCUTANEOUS

## 2022-10-29 MED ORDER — LATANOPROST 0.005 % OP SOLN
1.0000 [drp] | Freq: Every day | OPHTHALMIC | Status: DC
  Administered 2022-10-29 – 2022-11-02 (×5): 1 [drp] via OPHTHALMIC
  Filled 2022-10-29: qty 2.5

## 2022-10-29 MED ORDER — KETAMINE HCL 10 MG/ML IJ SOLN
INTRAMUSCULAR | Status: DC | PRN
  Administered 2022-10-29: 100 mg via INTRAVENOUS

## 2022-10-29 MED ORDER — REVEFENACIN 175 MCG/3ML IN SOLN
175.0000 ug | Freq: Every day | RESPIRATORY_TRACT | Status: DC
  Administered 2022-10-29 – 2022-11-03 (×6): 175 ug via RESPIRATORY_TRACT
  Filled 2022-10-29 (×6): qty 3

## 2022-10-29 MED ORDER — KETAMINE HCL 50 MG/5ML IJ SOSY
100.0000 mg | PREFILLED_SYRINGE | Freq: Once | INTRAMUSCULAR | Status: DC
  Filled 2022-10-29: qty 10

## 2022-10-29 MED ORDER — KETAMINE HCL 50 MG/5ML IJ SOSY
50.0000 mg | PREFILLED_SYRINGE | Freq: Once | INTRAMUSCULAR | Status: AC
  Administered 2022-10-29: 50 mg via INTRAVENOUS

## 2022-10-29 MED ORDER — DOCUSATE SODIUM 100 MG PO CAPS: 100.0000 mg | ORAL_CAPSULE | Freq: Two times a day (BID) | ORAL | Status: DC | PRN

## 2022-10-29 MED ORDER — LACTATED RINGERS IV SOLN: INTRAVENOUS | Status: AC

## 2022-10-29 MED ORDER — KETAMINE HCL 50 MG/5ML IJ SOSY
PREFILLED_SYRINGE | INTRAMUSCULAR | Status: AC
  Filled 2022-10-29: qty 10

## 2022-10-29 MED ORDER — ORAL CARE MOUTH RINSE: 15.0000 mL | OROMUCOSAL | Status: DC | PRN

## 2022-10-29 MED ORDER — CHLORHEXIDINE GLUCONATE CLOTH 2 % EX PADS
6.0000 | MEDICATED_PAD | Freq: Every day | CUTANEOUS | Status: DC
  Administered 2022-10-29 – 2022-11-03 (×6): 6 via TOPICAL

## 2022-10-29 MED ORDER — FENTANYL CITRATE PF 50 MCG/ML IJ SOSY
25.0000 ug | PREFILLED_SYRINGE | INTRAMUSCULAR | Status: DC | PRN
  Administered 2022-10-29: 50 ug via INTRAVENOUS

## 2022-10-29 MED ORDER — BUDESONIDE 0.5 MG/2ML IN SUSP
0.5000 mg | Freq: Two times a day (BID) | RESPIRATORY_TRACT | Status: DC
  Administered 2022-10-29 – 2022-11-03 (×11): 0.5 mg via RESPIRATORY_TRACT
  Filled 2022-10-29 (×11): qty 2

## 2022-10-29 MED ORDER — GERHARDT'S BUTT CREAM
TOPICAL_CREAM | Freq: Four times a day (QID) | CUTANEOUS | Status: DC
  Administered 2022-10-30 – 2022-11-02 (×5): 1 via TOPICAL
  Filled 2022-10-29: qty 1

## 2022-10-29 MED ORDER — KETAMINE HCL 50 MG/5ML IJ SOSY
0.5000 mg/kg | PREFILLED_SYRINGE | Freq: Once | INTRAMUSCULAR | Status: AC
  Administered 2022-10-29: 40 mg via INTRAVENOUS

## 2022-10-29 MED ORDER — SODIUM CHLORIDE 0.9 % IV SOLN
500.0000 mg | INTRAVENOUS | Status: DC
  Administered 2022-10-29 – 2022-10-30 (×2): 500 mg via INTRAVENOUS
  Filled 2022-10-29 (×3): qty 5

## 2022-10-29 MED ORDER — METHYLPREDNISOLONE SODIUM SUCC 40 MG IJ SOLR
40.0000 mg | Freq: Two times a day (BID) | INTRAMUSCULAR | Status: DC
  Administered 2022-10-29 – 2022-10-30 (×2): 40 mg via INTRAVENOUS
  Filled 2022-10-29 (×2): qty 1

## 2022-10-29 MED ORDER — ARFORMOTEROL TARTRATE 15 MCG/2ML IN NEBU
15.0000 ug | INHALATION_SOLUTION | Freq: Two times a day (BID) | RESPIRATORY_TRACT | Status: DC
  Administered 2022-10-29 – 2022-11-03 (×11): 15 ug via RESPIRATORY_TRACT
  Filled 2022-10-29 (×11): qty 2

## 2022-10-29 MED ORDER — LACTATED RINGERS IV BOLUS
1000.0000 mL | Freq: Once | INTRAVENOUS | Status: AC
  Administered 2022-10-29: 1000 mL via INTRAVENOUS

## 2022-10-29 MED ORDER — NOREPINEPHRINE 4 MG/250ML-% IV SOLN
2.0000 ug/min | INTRAVENOUS | Status: DC
  Administered 2022-10-29: 2 ug/min via INTRAVENOUS
  Filled 2022-10-29: qty 250

## 2022-10-29 MED ORDER — ENOXAPARIN SODIUM 40 MG/0.4ML IJ SOSY
40.0000 mg | PREFILLED_SYRINGE | INTRAMUSCULAR | Status: DC
  Administered 2022-10-29 – 2022-11-03 (×6): 40 mg via SUBCUTANEOUS
  Filled 2022-10-29 (×6): qty 0.4

## 2022-10-29 MED ORDER — DEXMEDETOMIDINE HCL IN NACL 400 MCG/100ML IV SOLN
0.0000 ug/kg/h | INTRAVENOUS | Status: DC
  Administered 2022-10-29 (×2): 0.5 ug/kg/h via INTRAVENOUS
  Administered 2022-10-29: 0.4 ug/kg/h via INTRAVENOUS
  Administered 2022-10-30: 0.7 ug/kg/h via INTRAVENOUS
  Filled 2022-10-29 (×3): qty 100

## 2022-10-29 MED ORDER — SODIUM CHLORIDE 0.9% FLUSH
10.0000 mL | Freq: Two times a day (BID) | INTRAVENOUS | Status: DC
  Administered 2022-10-29 – 2022-11-02 (×9): 10 mL
  Administered 2022-11-03: 20 mL

## 2022-10-29 MED ORDER — DEXMEDETOMIDINE HCL IN NACL 400 MCG/100ML IV SOLN: 0.0000 ug/kg/h | INTRAVENOUS | Status: DC

## 2022-10-29 NOTE — IPAL (Signed)
  Interdisciplinary Goals of Care Family Meeting   Date carried out: 10/29/2022  Location of the meeting: Bedside  Member's involved: Physician and Family Member or next of kin  Durable Power of Attorney or Environmental health practitioner: wife    Discussion: We discussed goals of care for [name] .    All in agreement to push forward with trial of vent but no cpr or shocks should natural death occure  Code status: modified DNR  Disposition: Continue current acute care  Time spent for the meeting: n/a    Lorin Glass, MD  10/29/2022, 3:41 PM

## 2022-10-29 NOTE — Progress Notes (Signed)
Peripherally Inserted Central Catheter Placement  The IV Nurse has discussed with the patient and/or persons authorized to consent for the patient, the purpose of this procedure and the potential benefits and risks involved with this procedure.  The benefits include less needle sticks, lab draws from the catheter, and the patient may be discharged home with the catheter. Risks include, but not limited to, infection, bleeding, blood clot (thrombus formation), and puncture of an artery; nerve damage and irregular heartbeat and possibility to perform a PICC exchange if needed/ordered by physician.  Alternatives to this procedure were also discussed.  Bard Power PICC patient education guide, fact sheet on infection prevention and patient information card has been provided to patient /or left at bedside.  Consent obtained from pt's wife [name], at bedside.  PICC Placement Documentation  PICC Triple Lumen 10/29/22 Right Basilic 39 cm 0 cm (Active)  Indication for Insertion or Continuance of Line Vasoactive infusions 10/29/22 1543  Exposed Catheter (cm) 0 cm 10/29/22 1543  Site Assessment Clean, Dry, Intact 10/29/22 1543  Lumen #1 Status Saline locked;Flushed;Blood return noted 10/29/22 1543  Lumen #2 Status Saline locked;Flushed;Blood return noted 10/29/22 1543  Lumen #3 Status Saline locked;Flushed;Blood return noted 10/29/22 1543  Dressing Type Transparent;Securing device 10/29/22 1543  Dressing Status Antimicrobial disc in place;Clean, Dry, Intact 10/29/22 1543  Safety Lock Not Applicable 10/29/22 1543  Line Care Connections checked and tightened 10/29/22 1543  Line Adjustment (NICU/IV Team Only) No 10/29/22 1543  Dressing Intervention New dressing 10/29/22 1543  Dressing Change Due 11/05/22 10/29/22 1543       [name] 10/29/2022, 3:44 PM

## 2022-10-29 NOTE — Progress Notes (Signed)
Post intubation ABG results obtained on ventilator settings of VT: 520, RR: 18, FIO2: 100%, and PEEP:5.  Increased RR to 22 and decreased FIO2 to 70%.  Will continue to monitor.   Latest Reference Range & Units 10/29/22 10:58  Sample type  ARTERIAL  pH, Arterial 7.35 - 7.45  7.238 (L)  pCO2 arterial 32 - 48 mmHg 86.6 (HH)  pO2, Arterial 83 - 108 mmHg 165 (H)  TCO2 22 - 32 mmol/L 40 (H)  Acid-Base Excess 0.0 - 2.0 mmol/L 6.0 (H)  Bicarbonate 20.0 - 28.0 mmol/L 37.0 (H)  O2 Saturation % 99  Collection site  RADIAL, ALLEN'S TEST ACCEPTABLE

## 2022-10-29 NOTE — Progress Notes (Signed)
Minimal trop bump, can check again in AM I guess but not c/w ACS.

## 2022-10-29 NOTE — H&P (Signed)
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  Bagged by EMS for low sats and near coma.  Code status was reversed by wife and patient intubated for airway protection in ER.  CXR with LLL infiltrate.  Head CT pending.  ABG with severe CO2 retention.  Bps borderline. PCCM to admit.  Pertinent  Medical History  COPD Wheelchair bound HOT Severe muscle wasting, spasticity Sacral pressure ulcer   Significant Hospital Events: Including procedures, antibiotic start and stop dates in addition to other pertinent events   5/12 admit  Interim History / Subjective:  admit  Objective   Blood pressure (!) 75/56, pulse (!) 109, temperature (!) 97.5 F (36.4 C), resp. rate 16, height 5\' 7"  (1.702 m), weight 79.4 kg, SpO2 99 %.    Vent Mode: PRVC FiO2 (%):  [70 %-100 %] 70 % Set Rate:  [18 bmp-22 bmp] 22 bmp Vt Set:  [520 mL] 520 mL PEEP:  [5 cmH20] 5 cmH20 Plateau Pressure:  [18 cmH20-19 cmH20] 19 cmH20   Intake/Output Summary (Last 24 hours) at 10/29/2022 1108 Last data filed at 10/29/2022 1108 Gross per 24 hour  Intake 100 ml  Output --  Net 100 ml   Filed Weights   10/29/22 1010  Weight: 79.4 kg    Examination: General: ill appearing on vent HENT: pupils small, reactive, MM dry Lungs: rhonci bilaterally, ext warm Cardiovascular: tachy, good pulses Abdomen: soft, +BS Extremities: muscle wasting, trace edema Neuro: starting to move GU: Foley in place, severe hypospadia noted  EKG nonischemic Trop essentially neg Severe CO2 retention Leukocytosis Cr ok  Resolved Hospital Problem list   N/A  Assessment & Plan:  Septic shock, present on admission Acute on  chronic hypercarbic and hypoxic with respiratory failure secondary to left lower lobe community-acquired pneumonia 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  Check Legionella and strep antigens - Hold home muscle relaxers for now, ok for PRN benzo to prevent w/d -IV hydration as ordered, appears dry -Bronchodilators as ordered, steroids -SSI -If heads in wrong direction we will need early goals of care talk, will reach out to wife  Best Practice (right click and "Reselect all SmartList Selections" daily)   Diet/type: NPO DVT prophylaxis: other on hold for head ct GI prophylaxis: PPI Lines: Central line Foley:  N/A Code Status:  full code Last date of multidisciplinary goals of care discussion [Called and updated wife]  Labs   CBC: Recent Labs  Lab 10/29/22 1000  WBC 17.5*  NEUTROABS 14.4*  HGB 13.5  HCT 46.0  MCV 90.9  PLT 294    Basic Metabolic Panel: Recent Labs  Lab 10/29/22 1000  NA 140  K 5.1  CL 91*  CO2 35*  GLUCOSE 197*  BUN 19  CREATININE 0.70  CALCIUM 9.5  GFR: Estimated Creatinine Clearance: 73.1 mL/min (by C-G formula based on SCr of 0.7 mg/dL). Recent Labs  Lab 10/29/22 1000  WBC 17.5*    Liver Function Tests: Recent Labs  Lab 10/29/22 1000  AST 18  ALT 12  ALKPHOS 77  BILITOT 0.5  PROT 7.9  ALBUMIN 3.9   No results for input(s): "LIPASE", "AMYLASE" in the last 168 hours. No results for input(s): "AMMONIA" in the last 168 hours.  ABG    Component Value  Date/Time   PHART 7.343 (L) 01/19/2019 0852   PCO2ART 70.4 (HH) 01/19/2019 0852   PO2ART 93.3 01/19/2019 0852   HCO3 37.2 (H) 01/19/2019 0852   O2SAT 96.8 01/19/2019 0852     Coagulation Profile: Recent Labs  Lab 10/29/22 1000  INR 1.0    Cardiac Enzymes: No results for input(s): "CKTOTAL", "CKMB", "CKMBINDEX", "TROPONINI" in the last 168 hours.  HbA1C: Hgb A1c MFr Bld  Date/Time Value Ref Range Status  02/19/2020 10:12 PM 5.6 4.8 - 5.6 % Final    Comment:    (NOTE) Pre diabetes:          5.7%-6.4%  Diabetes:              >6.4%  Glycemic control for   <7.0% adults with diabetes   09/17/2019 08:29 PM 5.8 (H) 4.8 - 5.6 % Final    Comment:    (NOTE) Pre diabetes:          5.7%-6.4% Diabetes:              >6.4% Glycemic control for   <7.0% adults with diabetes     CBG: Recent Labs  Lab 10/29/22 1008  GLUCAP 194*    Review of Systems:   Wife updated on phone.  Past Medical History:  He,  has a past medical history of Arthritis, Barrett esophagus, Bronchitis, Carotid artery occlusion, COPD (chronic obstructive pulmonary disease) (HCC), Diabetes mellitus without complication (HCC), GERD (gastroesophageal reflux disease), Headache, History of thyroid cancer (2008), Hypertension, Restless leg, Sleep apnea with use of continuous positive airway pressure (CPAP), Thoracic ascending aortic aneurysm (HCC), thyroid ca (dx'd 2009 or 2010), and Ulcer.   Surgical History:   Past Surgical History:  Procedure Laterality Date   ANTERIOR CERVICAL DECOMP/DISCECTOMY FUSION N/A 09/03/2017   Procedure: ACDF - C3-C4 - C4-C5 - C5-C6;  Surgeon: Donalee Citrin, MD;  Location: [facility] OR;  Service: Neurosurgery;  Laterality: N/A;   CAROTID ENDARTERECTOMY Left January 03, 2006   Dr. Madilyn Fireman   COLONOSCOPY     EYE SURGERY Bilateral    cataract surgery with lens implants   game keepers thumb Right    JOINT REPLACEMENT Left    knee X 2   LUMBAR LAMINECTOMY/ DECOMPRESSION WITH MET-RX N/A 11/04/2019    Procedure: LUMBAR THREE-FOUR LUMBAR LAMINECTOMY/ DECOMPRESSION WITH MET-RX;  Surgeon: Jadene Pierini, MD;  Location: [facility] OR;  Service: Neurosurgery;  Laterality: N/A;   RADIOLOGY WITH ANESTHESIA N/A 10/16/2019   Procedure: MRI THORACIC LUMBAR SPINE;  Surgeon: Radiologist, Medication, MD;  Location: [facility] OR;  Service: Radiology;  Laterality: N/A;   ROTATOR CUFF REPAIR Right    THYROIDECTOMY  2008     Social History:   reports that he quit smoking about 17 years ago. His smoking use included cigars and cigarettes. He has never used smokeless tobacco. He reports that he does not drink alcohol and does not use drugs.   Family History:  His family history includes COPD in his mother; Cancer in his  father; Diabetes in his father; Hyperlipidemia in his father and mother; Hypertension in his father and mother; Kidney disease in his father.   Allergies Allergies  Allergen Reactions   Ciprofloxacin     Other reaction(s): Other (See Comments) Drug interactions     Home Medications  Prior to Admission medications   Medication Sig Start Date End Date Taking? Authorizing Provider  acetaminophen (TYLENOL) 325 MG tablet Take 2 tablets (650 mg total) by mouth every 6 (six) hours as needed (pain or temperature). 10/24/19   Angiulli, Mcarthur Rossetti, PA-C  albuterol (VENTOLIN HFA) 108 (90 Base) MCG/ACT inhaler Inhale 1 puff into the lungs every 4 (four) hours as needed for wheezing or shortness of breath. 11/20/19   Angiulli, Mcarthur Rossetti, PA-C  ATIVAN 0.5 MG tablet Take 0.5 mg by mouth every 4 (four) hours as needed. 08/08/21   [provider]  baclofen (LIORESAL) 20 MG tablet Take 2 tablets (40 mg total) by mouth 4 (four) times daily. 10/13/22   Lovorn, Aundra Millet, MD  diclofenac (VOLTAREN) 50 MG EC tablet Take 1 tablet (50 mg total) by mouth daily as needed. 07/15/21   Lovorn, Aundra Millet, MD  furosemide (LASIX) 40 MG tablet TAKE 1 TABLET (40 MG TOTAL) BY MOUTH DAILY. PATIENT NEEDS AN APPOINTMENT FOR FUTURE REFILLS.  10/06/21   Rollene Rotunda, MD  gabapentin (NEURONTIN) 100 MG capsule Take 100 mg by mouth 3 (three) times daily. 10/10/22   [provider]  gabapentin (NEURONTIN) 300 MG capsule Take 1 capsule (300 mg total) by mouth at bedtime. 07/11/21   Dohmeier, Porfirio Mylar, MD  guaiFENesin (MUCINEX) 600 MG 12 hr tablet Take 2 tablets (1,200 mg total) by mouth 2 (two) times daily. 11/20/19   Angiulli, Mcarthur Rossetti, PA-C  HYDROcodone-acetaminophen (NORCO) 7.5-325 MG tablet Take 1 tablet by mouth every 6 (six) hours as needed. Taking over from [facility] 10/13/22   Lovorn, Aundra Millet, MD  hyoscyamine (LEVSIN SL) 0.125 MG SL tablet SMARTSIG:1-2 Tablet(s) Sublingual Every 4 Hours 09/09/21   [provider]  ipratropium-albuterol (DUONEB) 0.5-2.5 (3) MG/3ML SOLN SMARTSIG:1 Vial(s) Via Nebulizer 12/06/20   [provider]  levothyroxine (SYNTHROID) 175 MCG tablet Take 175 mcg by mouth daily. 02/25/20   [provider]  methocarbamol (ROBAXIN) 750 MG tablet TAKE 1 TABLET (750 MG TOTAL) BY MOUTH 4 (FOUR) TIMES DAILY. 07/10/22   Lovorn, Aundra Millet, MD  Morphine Sulfate (MORPHINE CONCENTRATE) 10 mg / 0.5 ml concentrated solution  08/08/21   [provider]  nitroGLYCERIN (NITROSTAT) 0.4 MG SL tablet Place 0.4 mg under the tongue every 5 (five) minutes as needed for chest pain.    [provider]  pantoprazole (PROTONIX) 40 MG tablet Take 1 tablet (40 mg total) by mouth every evening. Patient taking differently: Take 40 mg by mouth daily as needed (indigestion). 11/20/19   Angiulli, Mcarthur Rossetti, PA-C  polyethylene glycol powder (MIRALAX) 17 GM/SCOOP powder Take 0.5 Containers by mouth daily. 17 gm every other day    [provider]  rizatriptan (MAXALT) 10 MG tablet 10 mg AS NEEDED (route: oral) 04/08/20   [provider]  senna-docusate (SENOKOT-S) 8.6-50 MG tablet Take 2 tablets by mouth 2 (two) times daily. Patient taking differently: Take 2 tablets by mouth at bedtime. 11/10/19   Kathlen Mody, MD  spironolactone (ALDACTONE) 25 MG tablet 25 mg DAILY  (route: oral) 01/22/20   [provider]  tiZANidine (ZANAFLEX) 4 MG tablet Take 0.5-1 tablets (2-4 mg total) by mouth 2 (two) times daily as needed for  muscle spasms. In addition to Robaxin and baclofen- due to SCI 10/13/22   Lovorn, Aundra Millet, MD  Travoprost, BAK Free, (TRAVATAN) 0.004 % SOLN ophthalmic solution 1 drop at bedtime.    [provider]  traZODone (DESYREL) 50 MG tablet Take 50 mg by mouth at bedtime.    [provider]  TRELEGY ELLIPTA 100-62.5-25 MCG/INH AEPB Inhale 1 puff into the lungs daily.  05/01/18   [provider]     Critical care time: 33 min cc time

## 2022-10-29 NOTE — Progress Notes (Signed)
Pharmacy Antibiotic Note  [name] is a 82 y.o. male for which pharmacy has been consulted for vancomycin dosing for pneumonia.  Patient with a history of Arthritis, Barrett esophagus, Bronchitis, Carotid artery occlusion, COPD, GERD, headache, thyroid cancer, HTN, RLS, thoracic ascending aortic aneurysm . Patient presenting with SOB/low sats and low GCS requiring intubation.  SCr 0.7 WBC 17.5; LA 0.9; T 98.4; HR 95; RR 22 COVID neg / flu neg  Plan: Azithromycin per MD Ceftriaxone per MD Vancomycin 1750 mg q24hr (eAUC 505.2) unless change in renal function Trend WBC, Fever, Renal function F/u cultures, clinical course, WBC De-escalate when able Levels at steady state F/u MRSA PCR  Height: 5\' 7"  (170.2 cm) Weight: 79.4 kg (175 lb) IBW/kg (Calculated) : 66.1  Temp (24hrs), Avg:97.9 F (36.6 C), Min:96.5 F (35.8 C), Max:98.5 F (36.9 C)  Recent Labs  Lab 10/29/22 1000  WBC 17.5*  CREATININE 0.70  LATICACIDVEN 0.9    Estimated Creatinine Clearance: 73.1 mL/min (by C-G formula based on SCr of 0.7 mg/dL).    Allergies  Allergen Reactions   Ciprofloxacin     Other reaction(s): Other (See Comments) Drug interactions   Microbiology results: Pending  Thank you for allowing pharmacy to be a part of this patient's care.  Delmar Landau, PharmD, BCPS 10/29/2022 12:34 PM ED Clinical Pharmacist -  [phone]

## 2022-10-29 NOTE — Progress Notes (Signed)
Date and time results received: 10/29/22 1750  Test: Troponin Critical Value: 268  Name of Provider Notified: Levon Hedger, MD

## 2022-10-29 NOTE — Progress Notes (Signed)
Transported from ED to CT and from CT to 2H without complications

## 2022-10-29 NOTE — Sepsis Progress Note (Addendum)
Elink following code sepsis  1206- multiple low bp, message sent to MD asking to consider ordering fluids Md responded with bp responded after 1L of fluids and was most likely med related hypotension. Lactic was normal no additional fluids ordered

## 2022-10-29 NOTE — ED Notes (Signed)
Unsuccessful attempt x 2 with OG MD notified

## 2022-10-29 NOTE — Progress Notes (Signed)
There is order for vasopressor, but patient is not receiving it yet with MAP higher than 65. Informed patient's RN regarding this matter. She will put in the consult for USPIV when infusing vasopressor. HS [employer]

## 2022-10-29 NOTE — ED Triage Notes (Signed)
Steady decline, copd chf unresponsive GCS 4 being bagged initial sats 74%

## 2022-10-29 NOTE — ED Provider Notes (Signed)
[place] EMERGENCY DEPARTMENT AT [facility] Provider Note   CSN: [id number] Arrival date & time: 10/29/22  7829     History  Chief Complaint  Patient presents with   Respiratory Distress    [name] is a 82 y.o. male.  Repeat VV patient is an 82 year old male with a history of thoracic ascending aortic aneurysm, COPD, diabetes, hypertension, had been on hospice but had been discharged from hospice recently because his medical conditions had stabilized, on 4 L of oxygen chronically and patient's wife reports that for the last week there have been insurance issues and there had not been a nurse that had come out.  She had noticed over the last week he had been having worsening weakness, eating and drinking very little and she had been doing CPAP at night and placing his oxygen in the morning.  Over the last few days she had noticed his oxygen had been a little bit lower when she took his CPAP off but this morning it was in the 70s.  She put his nasal cannula oxygen on but he was not awake or responsive today and she called 911.  When they arrived oxygen saturation was 32%.  They started to bag the patient due to minimal drive to breathe and being unresponsive.  Wife reports that patient used to have a MOST form but it has been discontinued.  She still confirms that he would be a DNR but reports that she would want him to be intubated at least for the short-term to see if he improved.  She has not noticed any cough but reports that he has recently had worsening upper body weakness which she already has weakness in his lower extremities and is concerned he was not strong enough to cough.  Today he also felt warm to her.  And route EMS was bagging patient with a neb but no other medications were given.  The history is provided by the EMS personnel, medical records and the spouse. The history is limited by the condition of the patient.       Home Medications Prior to Admission  medications   Medication Sig Start Date End Date Taking? Authorizing Provider  acetaminophen (TYLENOL) 325 MG tablet Take 2 tablets (650 mg total) by mouth every 6 (six) hours as needed (pain or temperature). 10/24/19   Angiulli, Mcarthur Rossetti, PA-C  albuterol (VENTOLIN HFA) 108 (90 Base) MCG/ACT inhaler Inhale 1 puff into the lungs every 4 (four) hours as needed for wheezing or shortness of breath. 11/20/19   Angiulli, Mcarthur Rossetti, PA-C  ATIVAN 0.5 MG tablet Take 0.5 mg by mouth every 4 (four) hours as needed. 08/08/21   [provider]  baclofen (LIORESAL) 20 MG tablet Take 2 tablets (40 mg total) by mouth 4 (four) times daily. 10/13/22   Lovorn, Aundra Millet, MD  diclofenac (VOLTAREN) 50 MG EC tablet Take 1 tablet (50 mg total) by mouth daily as needed. 07/15/21   Lovorn, Aundra Millet, MD  furosemide (LASIX) 40 MG tablet TAKE 1 TABLET (40 MG TOTAL) BY MOUTH DAILY. PATIENT NEEDS AN APPOINTMENT FOR FUTURE REFILLS. 10/06/21   Rollene Rotunda, MD  gabapentin (NEURONTIN) 100 MG capsule Take 100 mg by mouth 3 (three) times daily. 10/10/22   [provider]  gabapentin (NEURONTIN) 300 MG capsule Take 1 capsule (300 mg total) by mouth at bedtime. 07/11/21   Dohmeier, Porfirio Mylar, MD  guaiFENesin (MUCINEX) 600 MG 12 hr tablet Take 2 tablets (1,200 mg total) by mouth  2 (two) times daily. 11/20/19   Angiulli, Mcarthur Rossetti, PA-C  HYDROcodone-acetaminophen (NORCO) 7.5-325 MG tablet Take 1 tablet by mouth every 6 (six) hours as needed. Taking over from [facility] 10/13/22   Lovorn, Aundra Millet, MD  hyoscyamine (LEVSIN SL) 0.125 MG SL tablet SMARTSIG:1-2 Tablet(s) Sublingual Every 4 Hours 09/09/21   [provider]  ipratropium-albuterol (DUONEB) 0.5-2.5 (3) MG/3ML SOLN SMARTSIG:1 Vial(s) Via Nebulizer 12/06/20   [provider]  levothyroxine (SYNTHROID) 175 MCG tablet Take 175 mcg by mouth daily. 02/25/20   [provider]  methocarbamol (ROBAXIN) 750 MG tablet TAKE 1 TABLET (750 MG TOTAL) BY MOUTH 4 (FOUR) TIMES DAILY.  07/10/22   Lovorn, Aundra Millet, MD  Morphine Sulfate (MORPHINE CONCENTRATE) 10 mg / 0.5 ml concentrated solution  08/08/21   [provider]  nitroGLYCERIN (NITROSTAT) 0.4 MG SL tablet Place 0.4 mg under the tongue every 5 (five) minutes as needed for chest pain.    [provider]  pantoprazole (PROTONIX) 40 MG tablet Take 1 tablet (40 mg total) by mouth every evening. Patient taking differently: Take 40 mg by mouth daily as needed (indigestion). 11/20/19   Angiulli, Mcarthur Rossetti, PA-C  polyethylene glycol powder (MIRALAX) 17 GM/SCOOP powder Take 0.5 Containers by mouth daily. 17 gm every other day    [provider]  rizatriptan (MAXALT) 10 MG tablet 10 mg AS NEEDED (route: oral) 04/08/20   [provider]  senna-docusate (SENOKOT-S) 8.6-50 MG tablet Take 2 tablets by mouth 2 (two) times daily. Patient taking differently: Take 2 tablets by mouth at bedtime. 11/10/19   Kathlen Mody, MD  spironolactone (ALDACTONE) 25 MG tablet 25 mg DAILY  (route: oral) 01/22/20   [provider]  tiZANidine (ZANAFLEX) 4 MG tablet Take 0.5-1 tablets (2-4 mg total) by mouth 2 (two) times daily as needed for muscle spasms. In addition to Robaxin and baclofen- due to SCI 10/13/22   Lovorn, Aundra Millet, MD  Travoprost, BAK Free, (TRAVATAN) 0.004 % SOLN ophthalmic solution 1 drop at bedtime.    [provider]  traZODone (DESYREL) 50 MG tablet Take 50 mg by mouth at bedtime.    [provider]  TRELEGY ELLIPTA 100-62.5-25 MCG/INH AEPB Inhale 1 puff into the lungs daily.  05/01/18   [provider]      Allergies    Ciprofloxacin    Review of Systems   Review of Systems  Physical Exam Updated Vital Signs BP 110/75   Pulse 94   Temp 98.4 F (36.9 C)   Resp (!) 22   Ht 5\' 7"  (1.702 m)   Wt 79.4 kg   SpO2 98%   BMI 27.41 kg/m  Physical Exam Vitals and nursing note reviewed.  Constitutional:      Appearance: He is well-developed. He is toxic-appearing.      Comments: unresponsive  HENT:     Head: Normocephalic and atraumatic.     Mouth/Throat:     Mouth: Mucous membranes are dry.     Comments: Mucus present in the mouth and nose Eyes:     Conjunctiva/sclera: Conjunctivae normal.     Pupils: Pupils are equal, round, and reactive to light.  Cardiovascular:     Rate and Rhythm: Regular rhythm. Tachycardia present.     Heart sounds: No murmur heard. Pulmonary:     Effort: Pulmonary effort is normal. No respiratory distress.     Breath sounds: Rhonchi present. No wheezing or rales.  Abdominal:     General: There is no distension.  Palpations: Abdomen is soft.     Tenderness: There is no abdominal tenderness. There is no guarding or rebound.  Musculoskeletal:        General: No tenderness. Normal range of motion.     Cervical back: Normal range of motion and neck supple.     Right lower leg: Edema present.     Left lower leg: Edema present.     Comments: 1+ pitting edema in bilateral lower extremities  Skin:    General: Skin is warm and dry.     Findings: No erythema or rash.  Neurological:     Comments: GCS of 4, no gag, minimally shallow breathing  Psychiatric:        Behavior: Behavior normal.     ED Results / Procedures / Treatments   Labs (all labs ordered are listed, but only abnormal results are displayed) Labs Reviewed  COMPREHENSIVE METABOLIC PANEL - Abnormal; Notable for the following components:      Result Value   Chloride 91 (*)    CO2 35 (*)    Glucose, Bld 197 (*)    All other components within normal limits  CBC WITH DIFFERENTIAL/PLATELET - Abnormal; Notable for the following components:   WBC 17.5 (*)    MCHC 29.3 (*)    Neutro Abs 14.4 (*)    Abs Immature Granulocytes 0.78 (*)    All other components within normal limits  CBG MONITORING, ED - Abnormal; Notable for the following components:   Glucose-Capillary 194 (*)    All other components within normal limits  I-STAT ARTERIAL BLOOD GAS, ED -  Abnormal; Notable for the following components:   pH, Arterial 7.238 (*)    pCO2 arterial 86.6 (*)    pO2, Arterial 165 (*)    Bicarbonate 37.0 (*)    TCO2 40 (*)    Acid-Base Excess 6.0 (*)    All other components within normal limits  TROPONIN I (HIGH SENSITIVITY) - Abnormal; Notable for the following components:   Troponin I (High Sensitivity) 57 (*)    All other components within normal limits  RESP PANEL BY RT-PCR (RSV, FLU A&B, COVID)  RVPGX2  CULTURE, BLOOD (ROUTINE X 2)  CULTURE, BLOOD (ROUTINE X 2)  LACTIC ACID, PLASMA  PROTIME-INR  APTT  LACTIC ACID, PLASMA  BRAIN NATRIURETIC PEPTIDE  URINALYSIS, W/ REFLEX TO CULTURE (INFECTION SUSPECTED)  BLOOD GAS, ARTERIAL  I-STAT VENOUS BLOOD GAS, ED    EKG EKG Interpretation  Date/Time:  Sunday Oct 29 2022 10:17:01 EDT Ventricular Rate:  122 PR Interval:  125 QRS Duration: 128 QT Interval:  345 QTC Calculation: 492 R Axis:   -85 Text Interpretation: Sinus tachycardia Ventricular premature complex , 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 EXAM: PORTABLE CHEST 1 VIEW COMPARISON:  Chest radiograph 04/05/2020, 02/27/2020 FINDINGS: Of note, patient is moderately rotated on this portable examination. Endotracheal tube tip projects 5.2 cm above the carina. Stable enlarged cardiac silhouette. Hazy airspace opacity at the left lung base with probable small left pleural effusion. Right lung is clear. No pneumothorax. Anterior cervical spinal fixation. Surgical clips noted at the thyroid bed bilaterally. IMPRESSION: 1. Endotracheal tube tip projects 5.2 cm above the carina. 2. Hazy airspace opacity at the left lung base, which may reflect atelectasis or infection with probable small left pleural effusion. Electronically Signed   By:  Sherron Ales M.D.   On: 10/29/2022  11:22    Procedures Procedures    Medications Ordered in ED Medications  lactated ringers infusion ( Intravenous New Bag/Given 10/29/22 1105)  cefTRIAXone (ROCEPHIN) 2 g in sodium chloride 0.9 % 100 mL IVPB (0 g Intravenous Stopped 10/29/22 1108)  azithromycin (ZITHROMAX) 500 mg in sodium chloride 0.9 % 250 mL IVPB (500 mg Intravenous New Bag/Given 10/29/22 1103)  ketamine 50 mg in normal saline 5 mL (10 mg/mL) syringe ( Intravenous See Procedure Record 10/29/22 1049)  ketamine (KETALAR) injection (100 mg Intravenous Given 10/29/22 1011)  fentaNYL (SUBLIMAZE) injection 25 mcg (has no administration in time range)  fentaNYL (SUBLIMAZE) injection 25-100 mcg (50 mcg Intravenous Given 10/29/22 1026)  midazolam (VERSED) injection 1-2 mg (2 mg Intravenous Given 10/29/22 1026)  lactated ringers bolus 1,000 mL (1,000 mLs Intravenous New Bag/Given 10/29/22 1046)  ketamine 50 mg in normal saline 5 mL (10 mg/mL) syringe (50 mg Intravenous Given 10/29/22 1129)    ED Course/ Medical Decision Making/ A&P                             Medical Decision Making Amount and/or Complexity of Data Reviewed Independent Historian: spouse and EMS External Data Reviewed: notes. Labs: ordered. Radiology: ordered. ECG/medicine tests: ordered.  Risk Prescription drug management.   Pt with multiple medical problems and comorbidities and presenting today with a complaint that caries a high risk for morbidity and mortality.  Here today with respiratory failure.  Upon arrival patient is not responsive, minimally breathing and has a GCS of 4.  He is currently being bagged and based on discussion with paramedic and patient's wife she indicated he would want to be intubated.  Attempted to reach the wife by phone but it is busy.  Patient sats are 100% but requiring constant bagging.  Decision was made to intubate the patient.  Blood pressure has remained stable patient also tachycardic.  Patient feels hot to the touch and  concern for sepsis versus hypercarbia and COPD exacerbation versus CHF versus acute renal failure and electrolyte abnormalities.  Patient was intubated without difficulty with a large amount of pus removed from the airway.  Sepsis protocol was initiated.  Patient started on antibiotics.  After intubation patient did start having some blinking of his eyes and he was put on as needed sedation.  Discussed the findings with the patient's wife who again in person agrees that he would want to be intubated but does not want CPR if his heart were to stop. I independently interpreted patient's EKG and labs.  EKG shows a new right bundle branch block which is changed from the EKG in 2021 also patient has sinus tachycardia.  No ST findings consistent with acute MI.  CBC with leukocytosis of 17, VBG with a pH of 7.12 today and a CO2 of greater than 110.  Will recheck VBG after patient has been on the vent.  He was covered with Rocephin and azithromycin.  G shows a pH of 7.23 CO2 of 86 and patient appears to be clearing mental status with being on the vent longer, lactic acid is within normal limits, CMP without acute changes, troponin mildly elevated at 57.  I have independently visualized and interpreted pt's images today.  Chest x-ray today with concern for possible pneumonia versus effusion.  Patient is now starting to wake up more is requiring more sedation.  Unfortunately with fentanyl and Versed it  drops his pressure which did respond to a fluid bolus.  Will discuss patient with critical care and start Precedex.they requested head CT.  Patient will require ICU admission.  This was discussed with patient's wife and daughter.  CRITICAL CARE Performed by: Joana Nolton Total critical care time: 40 minutes Critical care time was exclusive of separately billable procedures and treating other patients. Critical care was necessary to treat or prevent imminent or life-threatening deterioration. Critical care was time  spent personally by me on the following activities: development of treatment plan with patient and/or surrogate as well as nursing, discussions with consultants, evaluation of patient's response to treatment, examination of patient, obtaining history from patient or surrogate, ordering and performing treatments and interventions, ordering and review of laboratory studies, ordering and review of radiographic studies, pulse oximetry and re-evaluation of patient's condition.            Final Clinical Impression(s) / ED Diagnoses Final diagnoses:  Acute respiratory failure with hypoxia and hypercapnia (HCC)  Community acquired pneumonia of left lower lobe of lung  Sepsis with acute hypercapnic respiratory failure without septic shock, due to unspecified organism [facility])    Rx / DC Orders ED Discharge Orders     None         Gwyneth Sprout, MD 10/29/22 1137

## 2022-10-30 ENCOUNTER — Inpatient Hospital Stay (HOSPITAL_COMMUNITY): Payer: Medicare Other

## 2022-10-30 DIAGNOSIS — J189 Pneumonia, unspecified organism: Secondary | ICD-10-CM | POA: Diagnosis not present

## 2022-10-30 DIAGNOSIS — J9621 Acute and chronic respiratory failure with hypoxia: Secondary | ICD-10-CM | POA: Diagnosis not present

## 2022-10-30 DIAGNOSIS — A419 Sepsis, unspecified organism: Secondary | ICD-10-CM

## 2022-10-30 LAB — CBC
HCT: 38.3 % — ABNORMAL LOW (ref 39.0–52.0)
Hemoglobin: 11.9 g/dL — ABNORMAL LOW (ref 13.0–17.0)
MCH: 26.4 pg (ref 26.0–34.0)
MCHC: 31.1 g/dL (ref 30.0–36.0)
MCV: 84.9 fL (ref 80.0–100.0)
Platelets: 217 10*3/uL (ref 150–400)
RBC: 4.51 MIL/uL (ref 4.22–5.81)
RDW: 15.7 % — ABNORMAL HIGH (ref 11.5–15.5)
WBC: 10.4 10*3/uL (ref 4.0–10.5)
nRBC: 0 % (ref 0.0–0.2)

## 2022-10-30 LAB — BASIC METABOLIC PANEL
Anion gap: 12 (ref 5–15)
BUN: 25 mg/dL — ABNORMAL HIGH (ref 8–23)
CO2: 31 mmol/L (ref 22–32)
Calcium: 9 mg/dL (ref 8.9–10.3)
Chloride: 94 mmol/L — ABNORMAL LOW (ref 98–111)
Creatinine, Ser: 0.79 mg/dL (ref 0.61–1.24)
GFR, Estimated: >60 mL/min (ref >60)
Glucose, Bld: 124 mg/dL — ABNORMAL HIGH (ref 70–99)
Potassium: 4.5 mmol/L (ref 3.5–5.1)
Sodium: 137 mmol/L (ref 135–145)

## 2022-10-30 LAB — URINE CULTURE

## 2022-10-30 LAB — MAGNESIUM: Magnesium: 2.2 mg/dL (ref 1.7–2.4)

## 2022-10-30 LAB — GLUCOSE, CAPILLARY
Glucose-Capillary: 122 mg/dL — ABNORMAL HIGH (ref 70–99)
Glucose-Capillary: 102 mg/dL — ABNORMAL HIGH (ref 70–99)
Glucose-Capillary: 95 mg/dL (ref 70–99)
Glucose-Capillary: 75 mg/dL (ref 70–99)
Glucose-Capillary: 88 mg/dL (ref 70–99)

## 2022-10-30 LAB — CULTURE, BLOOD (ROUTINE X 2)

## 2022-10-30 LAB — TROPONIN I (HIGH SENSITIVITY): Troponin I (High Sensitivity): 299 ng/L (ref <18)

## 2022-10-30 LAB — PHOSPHORUS: Phosphorus: 3.7 mg/dL (ref 2.5–4.6)

## 2022-10-30 MED ORDER — ORAL CARE MOUTH RINSE: 15.0000 mL | OROMUCOSAL | Status: DC | PRN

## 2022-10-30 MED ORDER — METHYLPREDNISOLONE SODIUM SUCC 40 MG IJ SOLR
40.0000 mg | Freq: Every day | INTRAMUSCULAR | Status: AC
  Administered 2022-10-31 – 2022-11-03 (×4): 40 mg via INTRAVENOUS
  Filled 2022-10-30 (×4): qty 1

## 2022-10-30 MED ORDER — ORAL CARE MOUTH RINSE
15.0000 mL | OROMUCOSAL | Status: DC
  Administered 2022-10-30 – 2022-11-03 (×16): 15 mL via OROMUCOSAL

## 2022-10-30 NOTE — Progress Notes (Signed)
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  Extubated on 5/13  eICU Interventions  Resume home CPAP.   0144 - add one home afrin   Intervention Category Minor Interventions: Routine modifications to care plan (e.g. PRN medications for pain, fever)  Beauden Tremont 10/30/2022, 11:06 PM

## 2022-10-30 NOTE — Progress Notes (Addendum)
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  Bagged by EMS for low sats and near coma.  Code status was reversed by wife and patient intubated for airway protection in ER.  CXR with LLL infiltrate.  Head CT pending.  ABG with severe CO2 retention.  Bps borderline. PCCM to admit.  Pertinent  Medical History  COPD Wheelchair bound HOT Severe muscle wasting, spasticity Sacral pressure ulcer   Significant Hospital Events: Including procedures, antibiotic start and stop dates in addition to other pertinent events   5/12 admit 5/13 [facility] SBT   Interim History / Subjective:  Yesterday code status updated to DNR   NAEO  Trp this morning 299   Objective   Blood pressure 130/67, pulse (!) 57, temperature 97.9 F (36.6 C), resp. rate 14, height 5\' 7"  (1.702 m), weight 79.4 kg, SpO2 96 %.    Vent Mode: PSV;CPAP FiO2 (%):  [50 %-100 %] 50 % Set Rate:  [14 bmp-22 bmp] 14 bmp Vt Set:  [520 mL] 520 mL PEEP:  [5 cmH20-12 cmH20] 5 cmH20 Pressure Support:  [5 cmH20] 5 cmH20 Plateau Pressure:  [18 cmH20-23 cmH20] 20 cmH20   Intake/Output Summary (Last 24 hours) at 10/30/2022 1016 Last data filed at 10/30/2022 0800 Gross per 24 hour  Intake 3146.37 ml  Output 825 ml  Net 2321.37 ml   Filed Weights   10/29/22 1010 10/29/22 1930 10/30/22 0500  Weight: 79.4 kg 79.4 kg 79.4 kg    Examination: General: Critically and chronically ill appearing elderly M intubated sedated  HENT: NCAT pink mm ETT secure  Lungs: Mechanically ventilated. Some scattered rhonchi  Cardiovascular: rr cap refill < 3 sec  Abdomen: Soft ndnt  Extremities: no acute joint deformity  Neuro: Awake  following commands. Generalized weakness  GU: foley    Resolved Hospital Problem list   N/A  Assessment & Plan:   Acute on chronic hypoxic and hypercarbic resp failure LLL CAP Advanced COPD / Sleep apnea overlap, on CPAP  P -rocephin, azithro, vanc  -solumedrol -VAP, pulm hygiene -wean MV as able -[facility]/ SBT -- hopefully extubate 5/13  -f/u legionella and strep pneumo ag  -steroids   Septic shock 2/2 CAP -- shock improved  P -abx as above -off NE   HTN Bradycardia  -HR at least in part 2/2 dex. Pressures ok so not a huge problem  P -holding antihypertensives, ok pressures while on dex   DM P -SSI  Chronic pain  Arthritis  P -holding home meds   FTT in adult Physical debility Incomplete paraplegia  GOC DNR status  P -palliative consulted    Best Practice (right click and "Reselect all SmartList Selections" daily)   Diet/type: NPO DVT prophylaxis: other on hold for head ct GI prophylaxis: PPI Lines: Central line Foley:  N/A Code Status:  full code Last date of multidisciplinary goals of care discussion [Called and updated wife 5/12]  Labs   CBC: Recent Labs  Lab 10/29/22 1000 10/29/22 1058 10/29/22 1545 10/29/22 1633 10/30/22 0525  WBC 17.5*  --   --   --  10.4  NEUTROABS 14.4*  --   --   --   --  HGB 13.5 13.6 13.3 12.6* 11.9*  HCT 46.0 40.0 39.0 37.0* 38.3*  MCV 90.9  --   --   --  84.9  PLT 294  --   --   --  217    Basic Metabolic Panel: Recent Labs  Lab 10/29/22 1000 10/29/22 1058 10/29/22 1545 10/29/22 1633 10/30/22 0525  NA 140 138 138 139 137  K 5.1 4.5 4.1 4.0 4.5  CL 91*  --   --   --  94*  CO2 35*  --   --   --  31  GLUCOSE 197*  --   --   --  124*  BUN 19  --   --   --  25*  CREATININE 0.70  --   --   --  0.79  CALCIUM 9.5  --   --   --  9.0  MG  --   --   --   --  2.2  PHOS  --   --   --   --  3.7   GFR: Estimated Creatinine Clearance: 73.1 mL/min (by C-G formula based on SCr of 0.79 mg/dL). Recent Labs  Lab  10/29/22 1000 10/29/22 1309 10/30/22 0525  WBC 17.5*  --  10.4  LATICACIDVEN 0.9 1.7  --     Liver Function Tests: Recent Labs  Lab 10/29/22 1000  AST 18  ALT 12  ALKPHOS 77  BILITOT 0.5  PROT 7.9  ALBUMIN 3.9   No results for input(s): "LIPASE", "AMYLASE" in the last 168 hours. No results for input(s): "AMMONIA" in the last 168 hours.  ABG    Component Value Date/Time   PHART 7.494 (H) 10/29/2022 1633   PCO2ART 43.2 10/29/2022 1633   PO2ART 100 10/29/2022 1633   HCO3 33.2 (H) 10/29/2022 1633   TCO2 34 (H) 10/29/2022 1633   O2SAT 98 10/29/2022 1633     Coagulation Profile: Recent Labs  Lab 10/29/22 1000  INR 1.0    Cardiac Enzymes: No results for input(s): "CKTOTAL", "CKMB", "CKMBINDEX", "TROPONINI" in the last 168 hours.  HbA1C: Hgb A1c MFr Bld  Date/Time Value Ref Range Status  10/29/2022 01:11 PM 5.8 (H) 4.8 - 5.6 % Final    Comment:    (NOTE) Pre diabetes:          5.7%-6.4%  Diabetes:              >6.4%  Glycemic control for   <7.0% adults with diabetes   02/19/2020 10:12 PM 5.6 4.8 - 5.6 % Final    Comment:    (NOTE) Pre diabetes:          5.7%-6.4%  Diabetes:              >6.4%  Glycemic control for   <7.0% adults with diabetes     CBG: Recent Labs  Lab 10/29/22 1008 10/29/22 1543 10/29/22 1934 10/29/22 2259 10/30/22 0538  GLUCAP 194* 105* 92 114* 122*    CRITICAL CARE Performed by: Lanier Clam   Total critical care time: 37 minutes  Critical care time was exclusive of separately billable procedures and treating other patients. Critical care was necessary to treat or prevent imminent or life-threatening deterioration.  Critical care was time spent personally by me on the following activities: development of treatment plan with patient and/or surrogate as well as nursing, discussions with consultants, evaluation of patient's response to treatment, examination of patient, obtaining history from patient or surrogate,  ordering and performing treatments  and interventions, ordering and review of laboratory studies, ordering and review of radiographic studies, pulse oximetry and re-evaluation of patient's condition.  Tessie Fass MSN, AGACNP-BC [facility] Amion for pager  10/30/2022, 10:16 AM

## 2022-10-30 NOTE — Procedures (Signed)
Extubation Procedure Note  Patient Details:   Name: STEVON BROWNSON DOB: 1941-01-25 MRN: [id number]   Airway Documentation:    Vent end date: 10/30/22 Vent end time: 1218   Evaluation  O2 sats: stable throughout Complications: No apparent complications Patient did tolerate procedure well. Bilateral Breath Sounds: Diminished, Rhonchi   Yes  Pt extubated to 2L [place], pt tolerating well at this time. Cuff leak positive, no stridor noted. RN at bedside, RT will monitor as needed.   Thornell Mule 10/30/2022, 12:18 PM

## 2022-10-30 NOTE — Consult Note (Signed)
WOC Nurse Consult Note: Reason for Consult: multiple pressure injuries Discussed with bedside nursing Wound type: Stage 2 pressure injury; coccyx; healing, nursing reports partial thickness skin loss.  Pressure Injury POA: Yes Measurement:see nursing flow sheets Wound VOZ:DGUYQI/HKVQ Drainage (amount, consistency, odor) minimal  Periwound: intact  Dressing procedure/placement/frequency: Continue silicone foam dressing per nursing skin care order set. Gerhardt butt cream added  per nursing staff appropriately apply to affected areas.  Prevalon boots bilaterally added appropriately per nursing staff to offload heels in high risk patient  Low air loss mattress in place for moisture management and pressure redistribution   Discussed POC with bedside nurse.  Re consult if needed, will not follow at this time. Thanks  Porfirio Bollier [employer], RN,CWOCN, CNS, CWON-AP [phone])

## 2022-10-30 NOTE — Progress Notes (Addendum)
Initial Nutrition Assessment  DOCUMENTATION CODES:   Not applicable  INTERVENTION:  Once diet able to be advanced recommend: -Ensure Enlive po BID, each supplement provides 350 kcal and 20 grams of protein -Magic cup po BID with lunch and dinner, each supplement provides 290 kcal and 9 grams of protein -Multivitamin with minerals po daily  NUTRITION DIAGNOSIS:   Increased nutrient needs related to wound healing, catabolic illness (COPD) as evidenced by estimated needs.  GOAL:   Patient will meet greater than or equal to 90% of their needs  MONITOR:   Diet advancement, PO intake, Supplement acceptance, Labs, Weight trends, Skin, I & O's  REASON FOR ASSESSMENT:   Ventilator, Malnutrition Screening Tool    ASSESSMENT:   82 year old male with PMHx of incomplete paraplegia now near bed bound and wheel chair bound, HTN, GERD, Barrett esophagus, DM, severe COPD, sleep apnea, thyroid cancer s/p thyroidectomy 2008 who is admitted with respiratory failure from left lower lobe CAP and COPD exacerbation. Pt was previously in hospice care.  5/12: intubated 5/13: extubated  Met with pt, his wife, and another family member at bedside. Pt able to provide some history and then patient's wife also assisted with providing history. Pt typically has a good appetite and intake at baseline. Wife reports for the past week pt has had decreased PO intake. She reports he was more sleepy the past week and would only eat small amounts of a meal or only bites before falling asleep. He typically eats 3 meals per day. Family cuts food into bite-sized pieces but he is on thin liquids at baseline. For breakfast he has waffles with a fried egg. For lunch he has a pimento cheese sandwich cut into pieces. For dinner he has some type of fish with sweet potato or mashed potatoes. He drinks milk, chocolate milk, juice, and soda. Denies food allergies or intolerances. Also denies nausea, emesis, or abdominal pain. Pt  reports difficulty chewing and a history of difficulty swallowing. He is edentulous.  He wears upper dentures only but these are currently at home. He reports having MBS several years ago but was approved for regular diet with thin liquids. Family cuts food into bite sized pieces and he eats slowly. Family reports several years ago pt had several large wounds. Wound on back has healed and wound on sacrum is healing. Pending diet advancement after extubation. Pt is amenable to drinking Ensure to help meet calorie/protein needs with diet advancement. He would also like to try [employer]. Family reports pt is mostly bed bound due to difficulty with wife using lift at home alone. Suspect muscle wasting related to incomplete paraplegia, mostly bed bound status in setting of limited use.  Family reports UBW was 190-200 lbs (86.4-90.9 kg) approximately 5 years ago and he has had slow weight loss over time. Per chart pt was 90.7 kg on 02/27/20. He was 72.6 kg on 08/28/22. Current weight is 69.4 kg (175.05 lbs). Family reports it is difficult to weigh pt so they are not sure of recent trends.  Medications reviewed and include: Novolog 0-15 units Q4hrs, Solu-Medrol 40 mg daily IV, pantoprazole, azithromycin, ceftriaxone, vancomycin  Labs reviewed: CBG 75-122, Chloride 94, BUN 25  UOP: 725 mL in previous 24 hours  I/O: +2276 mL since admission  Discussed with RN via secure chat. Likely plan for bedside swallow evaluation and then diet advancement as tolerated.  NUTRITION - FOCUSED PHYSICAL EXAM:  Flowsheet Row Most Recent Value  Orbital Region No depletion  Upper Arm Region Mild depletion  Thoracic and Lumbar Region No depletion  Buccal Region No depletion  Temple Region No depletion  Clavicle Bone Region Mild depletion  Clavicle and Acromion Bone Region Moderate depletion  [difficulty assessing due to pain reported by pt]  Scapular Bone Region Unable to assess  [Pt requested RD not assess due to pain]   Dorsal Hand Moderate depletion  Patellar Region Severe depletion  [left lower extremity, more difficult to assess right lower extremity due to edema]  Anterior Thigh Region Moderate depletion  [left lower extremity, more difficult to assess right lower extremity due to edema]  Posterior Calf Region Severe depletion  [left lower extremity, more difficult to assess right lower extremity due to edema]  Edema (RD Assessment) Mild  Hair Reviewed  Eyes Reviewed  Mouth Reviewed  [edentulous]  Skin Reviewed  [psoriasis to scalp]  Nails Reviewed      Diet Order:   Diet Order             Diet NPO time specified  Diet effective now                  EDUCATION NEEDS:   No education needs have been identified at this time  Skin:  Skin Assessment: Skin Integrity Issues: Skin Integrity Issues:: Stage II Stage II: stage 2 coccyx that is healing per WOC RN note  Last BM:  Unknown/PTA  Height:   Ht Readings from Last 1 Encounters:  10/29/22 5\' 7"  (1.702 m)   Weight:   Wt Readings from Last 1 Encounters:  10/30/22 79.4 kg   Ideal Body Weight:  67.3 kg  BMI:  Body mass index is 27.42 kg/m.  Estimated Nutritional Needs:   Kcal:  2000-2200  Protein:  100-110 grams  Fluid:  2-2.2 L/day  Letta Median, MS, RD, LDN, CNSC Pager number available on Amion

## 2022-10-31 ENCOUNTER — Inpatient Hospital Stay (HOSPITAL_COMMUNITY): Payer: Medicare Other

## 2022-10-31 DIAGNOSIS — J9602 Acute respiratory failure with hypercapnia: Secondary | ICD-10-CM | POA: Diagnosis not present

## 2022-10-31 DIAGNOSIS — J9601 Acute respiratory failure with hypoxia: Secondary | ICD-10-CM

## 2022-10-31 LAB — GLUCOSE, CAPILLARY
Glucose-Capillary: 103 mg/dL — ABNORMAL HIGH (ref 70–99)
Glucose-Capillary: 108 mg/dL — ABNORMAL HIGH (ref 70–99)
Glucose-Capillary: 63 mg/dL — ABNORMAL LOW (ref 70–99)
Glucose-Capillary: 73 mg/dL (ref 70–99)
Glucose-Capillary: 75 mg/dL (ref 70–99)
Glucose-Capillary: 78 mg/dL (ref 70–99)
Glucose-Capillary: 94 mg/dL (ref 70–99)

## 2022-10-31 LAB — BASIC METABOLIC PANEL
Anion gap: 16 — ABNORMAL HIGH (ref 5–15)
BUN: 23 mg/dL (ref 8–23)
CO2: 27 mmol/L (ref 22–32)
Calcium: 8.8 mg/dL — ABNORMAL LOW (ref 8.9–10.3)
Chloride: 95 mmol/L — ABNORMAL LOW (ref 98–111)
Creatinine, Ser: 0.82 mg/dL (ref 0.61–1.24)
GFR, Estimated: >60 mL/min (ref >60)
Glucose, Bld: 122 mg/dL — ABNORMAL HIGH (ref 70–99)
Potassium: 3.4 mmol/L — ABNORMAL LOW (ref 3.5–5.1)
Sodium: 138 mmol/L (ref 135–145)

## 2022-10-31 LAB — CBC
HCT: 36.7 % — ABNORMAL LOW (ref 39.0–52.0)
Hemoglobin: 11.4 g/dL — ABNORMAL LOW (ref 13.0–17.0)
MCH: 26.2 pg (ref 26.0–34.0)
MCHC: 31.1 g/dL (ref 30.0–36.0)
MCV: 84.4 fL (ref 80.0–100.0)
Platelets: 245 10*3/uL (ref 150–400)
RBC: 4.35 MIL/uL (ref 4.22–5.81)
RDW: 15.9 % — ABNORMAL HIGH (ref 11.5–15.5)
WBC: 10.9 10*3/uL — ABNORMAL HIGH (ref 4.0–10.5)
nRBC: 0 % (ref 0.0–0.2)

## 2022-10-31 LAB — CULTURE, BLOOD (ROUTINE X 2): Special Requests: ADEQUATE

## 2022-10-31 MED ORDER — DEXTROSE 50 % IV SOLN
12.5000 g | INTRAVENOUS | Status: AC
  Administered 2022-10-31: 12.5 g via INTRAVENOUS
  Filled 2022-10-31: qty 50

## 2022-10-31 MED ORDER — SODIUM CHLORIDE 0.9 % IV SOLN
100.0000 mg | Freq: Two times a day (BID) | INTRAVENOUS | Status: DC
  Administered 2022-10-31 – 2022-11-01 (×3): 100 mg via INTRAVENOUS
  Filled 2022-10-31 (×3): qty 100

## 2022-10-31 MED ORDER — FUROSEMIDE 10 MG/ML IJ SOLN
20.0000 mg | Freq: Once | INTRAMUSCULAR | Status: AC
  Administered 2022-10-31: 20 mg via INTRAVENOUS
  Filled 2022-10-31: qty 2

## 2022-10-31 MED ORDER — POLYETHYLENE GLYCOL 3350 17 G PO PACK: 17.0000 g | PACK | Freq: Every day | ORAL | Status: DC | PRN

## 2022-10-31 MED ORDER — POTASSIUM CHLORIDE 10 MEQ/100ML IV SOLN
10.0000 meq | INTRAVENOUS | Status: AC
  Administered 2022-10-31 (×4): 10 meq via INTRAVENOUS
  Filled 2022-10-31 (×4): qty 100

## 2022-10-31 MED ORDER — ENSURE ENLIVE PO LIQD: 237.0000 mL | Freq: Three times a day (TID) | ORAL | Status: DC

## 2022-10-31 MED ORDER — ALBUTEROL SULFATE (2.5 MG/3ML) 0.083% IN NEBU: 2.5000 mg | INHALATION_SOLUTION | RESPIRATORY_TRACT | Status: DC | PRN

## 2022-10-31 MED ORDER — METHOCARBAMOL 1000 MG/10ML IJ SOLN
500.0000 mg | Freq: Four times a day (QID) | INTRAVENOUS | Status: DC | PRN
  Administered 2022-10-31 – 2022-11-01 (×3): 500 mg via INTRAVENOUS
  Filled 2022-10-31: qty 5
  Filled 2022-10-31 (×2): qty 500

## 2022-10-31 MED ORDER — OXYMETAZOLINE HCL 0.05 % NA SOLN
1.0000 | Freq: Two times a day (BID) | NASAL | Status: AC
  Administered 2022-10-31 – 2022-11-02 (×6): 1 via NASAL
  Filled 2022-10-31: qty 30

## 2022-10-31 NOTE — Progress Notes (Signed)
Pt said family will bring home cpap today. Pt did not tolerate hospital cpap.

## 2022-10-31 NOTE — Evaluation (Addendum)
Clinical/Bedside Swallow Evaluation Patient Details  Name: [name] MRN: [id number] Date of Birth: Jan 07, 1941  Today's Date: 10/31/2022 Time: SLP Start Time (ACUTE ONLY): 4098 SLP Stop Time (ACUTE ONLY): 1003 SLP Time Calculation (min) (ACUTE ONLY): 7 min  Past Medical History:  Past Medical History:  Diagnosis Date   Arthritis    Barrett esophagus    Bronchitis    Carotid artery occlusion    COPD (chronic obstructive pulmonary disease) (HCC)    Diabetes mellitus without complication (HCC)    GERD (gastroesophageal reflux disease)    Headache    migraines   History of thyroid cancer 2008   Hypertension    Restless leg    Sleep apnea with use of continuous positive airway pressure (CPAP)    2011 [facility] , [facility]  77cn central and obstrcutive. 16 cm water , 3 cm EPR.    Thoracic ascending aortic aneurysm (HCC)    4.2 cm ascending TAA 08/18/17 CTA. Annual imaging recommended.   thyroid ca dx'd 2009 or 2010   surg and radioactive isoptope   Ulcer    Peptic ulcer disease   Past Surgical History:  Past Surgical History:  Procedure Laterality Date   ANTERIOR CERVICAL DECOMP/DISCECTOMY FUSION N/A 09/03/2017   Procedure: ACDF - C3-C4 - C4-C5 - C5-C6;  Surgeon: Donalee Citrin, MD;  Location: [facility] OR;  Service: Neurosurgery;  Laterality: N/A;   CAROTID ENDARTERECTOMY Left January 03, 2006   Dr. Madilyn Fireman   COLONOSCOPY     EYE SURGERY Bilateral    cataract surgery with lens implants   game keepers thumb Right    JOINT REPLACEMENT Left    knee X 2   LUMBAR LAMINECTOMY/ DECOMPRESSION WITH MET-RX N/A 11/04/2019   Procedure: LUMBAR THREE-FOUR LUMBAR LAMINECTOMY/ DECOMPRESSION WITH MET-RX;  Surgeon: Jadene Pierini, MD;  Location: [facility] OR;  Service: Neurosurgery;  Laterality: N/A;   RADIOLOGY WITH ANESTHESIA N/A 10/16/2019   Procedure: MRI THORACIC LUMBAR SPINE;  Surgeon: Radiologist, Medication, MD;  Location: [facility] OR;  Service: Radiology;  Laterality: N/A;   ROTATOR CUFF REPAIR Right     THYROIDECTOMY  2008   HPI:  [name] is an 82 year old man who presented from home for progressive SOB.  Bagged by EMS for low sats and near coma.  Code status was reversed by wife and patient intubated for airway protection in ER. Pt previously on hospice. ETT 5/12-13. CXR with LLL infiltrate. Head CT 5/12 with no acute findings. Pt with hx incomplete paraplegia now near wheelchair bound, severe COPD/OSA overlap on HOT/CPAP, GERD with Barrett's esophagus.    Assessment / Plan / Recommendation  Clinical Impression  Pt presents with a mild oral dysphagia 2/2 edentulism.  Pt wears top dentures only, but they are not present in facility. Pt eats food cut into bite size pieces at baseline.  There was prolonged oral phase with solids and pt was able to acheive good oral clearance.  Grimacing noted with puree with pt reporting he does not like taste of applesauce given. Pt tolerated thin liquid by spoon, cup, and straw.  Pt required only one swallow per bolus.  There was infrequent cough which was delayed to PO intake and perceptually similar to baseline cough prior to initiation of PO trials.  Aspiration cannot be ruled out at bedside, especially given abnormal chest imaging and complex medical history.  Pt appears clinically appropriate to resume baseline diet, which is seemingly most consistent with mechanical soft texture.  Pt may need  to choose softer items until dentures arrive, which family is planning to bring today.  Will reach out to team to determine need for additional testing.    Recommend mechanical soft diet with thin liquids.  SLP Visit Diagnosis: Dysphagia, unspecified (R13.10)    Aspiration Risk  Mild aspiration risk    Diet Recommendation Dysphagia 3 (Mech soft);Thin liquid   Liquid Administration via: Cup;Straw Medication Administration: Whole meds with liquid Supervision: Staff to assist with self feeding Compensations: Slow rate;Small sips/bites Postural Changes: Seated  upright at 90 degrees    Other  Recommendations Oral Care Recommendations: Oral care BID    Recommendations for follow up therapy are one component of a multi-disciplinary discharge planning process, led by the attending physician.  Recommendations may be updated based on patient status, additional functional criteria and insurance authorization.  Follow up Recommendations No SLP follow up      Assistance Recommended at Discharge    Functional Status Assessment Patient has had a recent decline in their functional status and demonstrates the ability to make significant improvements in function in a reasonable and predictable amount of time.  Frequency and Duration min 2x/week  2 weeks       Prognosis Prognosis for improved oropharyngeal function: Fair      Swallow Study   General Date of Onset: 10/29/22 HPI: [name] is an 82 year old man who presented from home for progressive SOB.  Bagged by EMS for low sats and near coma.  Code status was reversed by wife and patient intubated for airway protection in ER. Pt previously on hospice. ETT 5/12-13. CXR with LLL infiltrate. Head CT 5/12 with no acute findings. Pt with hx incomplete paraplegia now near wheelchair bound, severe COPD/OSA overlap on HOT/CPAP. Type of Study: Bedside Swallow Evaluation Previous Swallow Assessment: MBSS 10/01/17 following ACDF with recs for D3/thin Diet Prior to this Study: NPO Temperature Spikes Noted: No Respiratory Status: Nasal cannula History of Recent Intubation: Yes Total duration of intubation (days): 1 days Date extubated: 10/30/22 Behavior/Cognition: Alert;Cooperative;Pleasant mood Oral Cavity Assessment: Within Functional Limits Oral Care Completed by SLP: No Oral Cavity - Dentition: Edentulous;Dentures, not available (Wears top dentures only, not present) Self-Feeding Abilities: Needs assist Patient Positioning: Upright in chair Baseline Vocal Quality: Normal Volitional Cough:  Strong Volitional Swallow: Able to elicit    Oral/Motor/Sensory Function Overall Oral Motor/Sensory Function: Mild impairment Facial ROM: Within Functional Limits Facial Symmetry: Within Functional Limits Lingual ROM: Within Functional Limits Lingual Symmetry:  (Tremor observed) Lingual Strength: Reduced Velum: Within Functional Limits Mandible: Within Functional Limits   Ice Chips Ice chips: Not tested   Thin Liquid Thin Liquid: Within functional limits Presentation: Cup;Spoon;Straw    Nectar Thick Nectar Thick Liquid: Not tested   Honey Thick Honey Thick Liquid: Not tested   Puree Puree: Within functional limits Presentation: Spoon   Solid     Solid: Impaired Oral Phase Functional Implications: Prolonged oral transit;Impaired mastication      Kerrie Pleasure, MA, CCC-SLP Acute Rehabilitation Services Office: [phone] 10/31/2022,10:25 AM

## 2022-10-31 NOTE — Progress Notes (Signed)
PCCM Interval Note   Updated family and patient on findings of MBS w/ severe to profound dysphagia.  Remains NPO.  Discussed options given dysphagia felt to be progressive and likelihood of improvement poor given prior neck radiation (2010) and neck surgeries. Ideally would need a PEG tube but aspiration still remains a risk with oral secretions and pt w/ severe GERD/ barrett's esophagus vs continuing current care, allow dysphagia diet for quality of life and enjoyment, but would be full DNR/ DNI and if any decompensation would transition to comfort focused care.  At this time, have elected for cortrak placement to allow further time and family discussions.  Order placed for cortrak and RD consult for TF initiation on 5/15.  Remains NPO with aspiration risk in place. Ongoing GOC, PMT consult pending.   Pt did eat dysphagia 3 diet prior to going for MBS since with higher O2 needs and worsening congested cough.  Will leave in ICU as remains open to short term intubation.     Additional time 35 mins      Posey Boyer, MSN, AG-ACNP-BC [facility] 10/31/2022, 5:18 PM  See Amion for pager If no response to pager, please call PCCM consult pager After 7:00 pm call Elink  '

## 2022-10-31 NOTE — Progress Notes (Signed)
Pt family brought home cpap on 5/14 but pt could not wear it due to air not getting into the pt mask.  Pt is currently on Hospital cpap with his own mask;pt did not tolerate hospital mask. Pt is on cpap of 4+5l bleed of oxygen. Pt could only tolerate  cpap pressure less 5.

## 2022-10-31 NOTE — Progress Notes (Signed)
NAME:  [name], MRN:  [id number], DOB:  03/18/1941, LOS: 2 ADMISSION DATE:  10/29/2022, CONSULTATION DATE:  10/29/22 REFERRING MD:  Anitra Lauth, CHIEF COMPLAINT:  SOB   History of Present Illness:  82 year old man hx incomplete paraplegia now near wheelchair bound, severe COPD/OSA overlap on HOT/CPAP previously on hospice presenting from home for progressive SOB.  Bagged by EMS for low sats and near coma.  Code status was reversed by wife and patient intubated for airway protection in ER.  CXR with LLL infiltrate.  Head CT pending.  ABG with severe CO2 retention.  Bps borderline. PCCM to admit.  Pertinent  Medical History  COPD Wheelchair bound HOT Severe muscle wasting, spasticity Sacral pressure ulcer  Significant Hospital Events: Including procedures, antibiotic start and stop dates in addition to other pertinent events   5/12 admit, code status updated to DNR 5/13 extubated   Interim History / Subjective:  Hypoglycemic overnight- remains npo pending SLP  Objective   Blood pressure (!) 144/88, pulse 80, temperature 100.3 F (37.9 C), temperature source Oral, resp. rate 18, height 5\' 7"  (1.702 m), weight 79.4 kg, SpO2 97 %.    Vent Mode: PSV;CPAP FiO2 (%):  [50 %] 50 % PEEP:  [5 cmH20] 5 cmH20 Pressure Support:  [5 cmH20] 5 cmH20   Intake/Output Summary (Last 24 hours) at 10/31/2022 0827 Last data filed at 10/31/2022 0700 Gross per 24 hour  Intake 913.57 ml  Output 1627 ml  Net -713.43 ml   Filed Weights   10/29/22 1010 10/29/22 1930 10/30/22 0500  Weight: 79.4 kg 79.4 kg 79.4 kg    Examination: General:  Aochronically ill appearing male sitting upright in bed in NAD HEENT: MM pink/moist, edentulous, mildly raspy voice  Neuro: Awake oriented to person, place, year, not month, MAE, tremor noted in both hands- new for pt CV: rr, no murmur PULM:  2L, coarse, scattered rhonchi more left diminished in bases, right coarse/ clear, no wheeze, congested cough GI: obese,  soft, bs+, NT, ND, foley- cyu chronic Extremities: warm/dry, +1LE edema, poor muscle mass, Skin: no rashes   Tmax 100.3 BCX ngtd MRSA PCR + UC insignificant growth Labs > K 3.4, sCr 0.82, CBC stable, trop hs 268> 299 CBG> 63 overnight UOP 1.6L/ 24hrs Net +1.6L   Resolved Hospital Problem list   N/A  Assessment & Plan:   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 Concerned for withdrawal from chronic meds however remains NPO.   - consider tx out of ICU later today if remains stable  HTN Bradycardia, resolved  -HR at least in part 2/2 dex. Pressures ok so not a huge problem  P - home MAR reviewed, not on antihypertensives.  Concern for withdrawal given chronic meds as below - diurese after K replaced   DM P - hypoglycemic overnight, stop SSI, may need to add fluids with dextrose   Chronic pain  Arthritis  FTT in adult Physical debility Incomplete paraplegia  GOC DNR status  Multiple DTI- POA P -  on home ativan (no recent refills per pharmacy), zanaflex, baclofen, gabapentin, norco, morphine, robaxin> given tachycardia, more hypertensive and tremor> concern for withdrawal.  Respiratory status tenuous at baseline and code status remains ok for intubation otherwise DNR.  Add back prn robaxin IV, can consider low dose IV morphine but this could exacerbate respiratory status.  Prn robaxin IV - remains NPO pending SLP - ongoing GOC, PMT consulted, previously on hospice remains DNR but not DNI - WOC    Hypothyroid - resume synthroid when taking PO's  Hypokalemia - NPO> IV replete, check Mag    Best Practice (right click and "Reselect all SmartList Selections" daily)   Diet/type: NPO DVT prophylaxis: LMWH  GI prophylaxis: PPI Lines: Central line- PICC Foley:  Yes, and it is still needed> chronic Code Status:  limited Last date of multidisciplinary goals of care discussion [Called and updated wife 5/12]  Pending 5/14.  Labs   CBC: Recent Labs  Lab 10/29/22 1000 10/29/22 1058 10/29/22 1545 10/29/22 1633 10/30/22 0525 10/31/22 0346  WBC 17.5*  --   --   --  10.4 10.9*  NEUTROABS 14.4*  --   --   --   --   --   HGB 13.5 13.6 13.3 12.6* 11.9* 11.4*  HCT 46.0 40.0 39.0 37.0* 38.3* 36.7*  MCV 90.9  --   --   --  84.9 84.4  PLT 294  --   --   --  217 245    Basic Metabolic Panel: Recent Labs  Lab 10/29/22 1000 10/29/22 1058 10/29/22 1545 10/29/22 1633 10/30/22 0525 10/31/22 0346  NA 140 138 138 139 137 138  K 5.1 4.5 4.1 4.0 4.5 3.4*  CL 91*  --   --   --  94* 95*  CO2 35*  --   --   --  31 27  GLUCOSE 197*  --   --   --  124* 122*  BUN 19  --   --   --  25* 23  CREATININE 0.70  --   --   --  0.79 0.82  CALCIUM 9.5  --   --   --  9.0 8.8*  MG  --   --   --   --  2.2  --   PHOS  --   --   --   --  3.7  --    GFR: Estimated Creatinine Clearance: 71.4 mL/min (by C-G formula based on SCr of 0.82 mg/dL). Recent Labs  Lab 10/29/22 1000 10/29/22 1309 10/30/22 0525 10/31/22 0346  WBC 17.5*  --  10.4 10.9*  LATICACIDVEN 0.9 1.7  --   --     Liver Function Tests: Recent Labs  Lab 10/29/22 1000  AST 18  ALT 12  ALKPHOS 77  BILITOT 0.5  PROT 7.9  ALBUMIN 3.9   No results for input(s): "LIPASE", "AMYLASE" in the last 168 hours. No results for input(s): "AMMONIA" in the last 168 hours.  ABG    Component Value Date/Time   PHART 7.494 (H) 10/29/2022 1633   PCO2ART 43.2 10/29/2022 1633   PO2ART 100 10/29/2022 1633   HCO3 33.2 (H) 10/29/2022 1633    TCO2 34 (H) 10/29/2022 1633   O2SAT 98 10/29/2022 1633     Coagulation Profile: Recent Labs  Lab 10/29/22 1000  INR 1.0    Cardiac Enzymes: No results for input(s): "CKTOTAL", "CKMB", "CKMBINDEX", "TROPONINI" in the last 168 hours.  HbA1C: Hgb A1c MFr Bld  Date/Time Value  Ref Range Status  10/29/2022 01:11 PM 5.8 (H) 4.8 - 5.6 % Final    Comment:    (NOTE) Pre diabetes:          5.7%-6.4%  Diabetes:              >6.4%  Glycemic control for   <7.0% adults with diabetes   02/19/2020 10:12 PM 5.6 4.8 - 5.6 % Final    Comment:    (NOTE) Pre diabetes:          5.7%-6.4%  Diabetes:              >6.4%  Glycemic control for   <7.0% adults with diabetes     CBG: Recent Labs  Lab 10/30/22 2007 10/31/22 0008 10/31/22 0320 10/31/22 0400 10/31/22 0754  GLUCAP 88 78 63* 108* 73     Posey Boyer, MSN, AG-ACNP-BC [facility] 10/31/2022, 8:27 AM  See Amion for pager If no response to pager, please call PCCM consult pager After 7:00 pm call Elink

## 2022-10-31 NOTE — Procedures (Signed)
Modified Barium Swallow Study  Patient Details  Name: [name] MRN: [id number] Date of Birth: 10-06-1940  Today's Date: 10/31/2022  Modified Barium Swallow completed.  Full report located under Chart Review in the Imaging Section.  History of Present Illness [name] is an 82 year old man who presented from home for progressive SOB.  Bagged by EMS for low sats and near coma.  Code status was reversed by wife and patient intubated for airway protection in ER. Pt previously on hospice. ETT 5/12-13. CXR with LLL infiltrate. Head CT 5/12 with no acute findings. Pt with hx incomplete paraplegia now near wheelchair bound, severe COPD/OSA overlap on HOT/CPAP, GERD with Barrett's esophagus.  Pt with hx thyroid cancern in 2009 with radiation tx.   Clinical Impression Patient is presenting with a severe to profound pharyngeal phase dysphagia as per this modified barium swallow study. SLP assessed his swallow with thin liquid and nectar thick liquid barium only. Swallow initiated at level of vallecular sinus. Epiglottic inversion was very limited which appeared due to epiglottis coming into contact with and obstructed by posterior pharyngeal wall, resulting in inadequate laryngeal vestibular closure and penetration of thin and nectar thick liquids. Partial anterior hyoid excursion, reduced pharyngeal space secondary to ACDF hardware as well as likely impact from h/o neck radiation resulted in very limited PES opening. (PES difficult to fully visualize secondary to obstruction caused by patient's shoulder) In addition, during swallow, epiglottis comes into direct contact with posterior pharyngeal wall, which markedly obstructs barium transit. SLP suspects that patient's current dysphagia is result of progression of his chronic dysphagia and unfortunately, likelihood of signficant improvement in his swallow function is poor. SLP recommending NPO if patient is to be full code/full scope as he is at a high risk  of aspiration as well as malnutrition. SLP will continue to follow to assist in GOC decisions, patient/family education, PO recommendations. Factors that may increase risk of adverse event in presence of aspiration [employer] 2021): Poor general health and/or compromised immunity;Frail or deconditioned;Limited mobility  Swallow Evaluation Recommendations Recommendations: NPO Medication Administration: Via alternative means Oral care recommendations: Oral care QID (4x/day) Recommended consults: Consider Palliative care     Angela Nevin, MA, CCC-SLP Speech Therapy

## 2022-10-31 NOTE — Progress Notes (Signed)
   10/31/22 1046  Incentive Spirometry Tx  Level of Service Assisted by RCP  Frequency Other (Comment)  Treatment Tolerance Tolerated well  IS - Achieved (mL) (RN, NT, or RT) 1000 mL

## 2022-10-31 NOTE — Care Management (Signed)
  Transition of Care [facility]) Screening Note   Patient Details  Name: [name] Date of Birth: 12-09-1940   Transition of Care [facility]) CM/SW Contact:    Gala Lewandowsky, RN Phone Number: 10/31/2022, 3:34 PM    Transition of Care Department [facility]) has reviewed the patient. Patient was discussed in progression rounds this morning. Patient presented for respiratory failure. Hx paraplegia and patient is wheelchair bound. Patient is currently active with [facility] RN Services-patient will need resumption orders if the plan is to return home. Per spouse, [facility] has not been in the home for an initial assessment. Patient was previously with [facility] for [facility] and spouse is deciding if they will need to return to Hospice Services-patient is a DNR. Spouse states patient has caregivers via [employer] for personal care services that are in the home in am and pm. Spouse states patient has DME hospital bed and is awaiting a CPAP on back order and suction machine from [employer]. Case Manager did reach out to [employer] to see if orders are available. We will continue to monitor patient advancement through interdisciplinary progression rounds. If new patient transition needs arise, please place a TOC consult.

## 2022-10-31 NOTE — Progress Notes (Signed)
   10/31/22 1045  Chest Physiotherapy Tx  $ Chest Physiotherapy (CPT) Initial  CPT Delivery Source Flutter valve  $ Flutter Blue Yes  CPT Duration 10  CPT Chest Site Full range  Post-Treatment Respirations 20  Cough Productive;Congested;Strong  Sputum Amount None  Position Up in chair  CPT Treatment Tolerance Tolerated well

## 2022-10-31 NOTE — Progress Notes (Signed)
SLP Note  Patient Details Name: [name] MRN: [id number] DOB: 1941/05/05   Cancelled treatment:        Spoke with NP and at present pt would want reintubation if required.  We will proceed with MBSS at present to determine safe, least restrictive diet and rule out silent aspiration given pt's current pna and risk factors in pt's medical history.  Additionally to hx noted in evaluation, pt with hx of thyroid cancer not located in chart with hx of radiation in 2009.  MBSS planned for later this date.    Kerrie Pleasure MA, CCC-SLP Acute Rehabilitation Services Office: [phone] 10/31/2022, 11:43 AM

## 2022-11-01 ENCOUNTER — Inpatient Hospital Stay (HOSPITAL_COMMUNITY): Payer: Medicare Other

## 2022-11-01 DIAGNOSIS — J9621 Acute and chronic respiratory failure with hypoxia: Secondary | ICD-10-CM | POA: Diagnosis not present

## 2022-11-01 DIAGNOSIS — Z515 Encounter for palliative care: Secondary | ICD-10-CM

## 2022-11-01 DIAGNOSIS — J9601 Acute respiratory failure with hypoxia: Secondary | ICD-10-CM | POA: Diagnosis not present

## 2022-11-01 DIAGNOSIS — J189 Pneumonia, unspecified organism: Secondary | ICD-10-CM

## 2022-11-01 DIAGNOSIS — R441 Visual hallucinations: Secondary | ICD-10-CM | POA: Diagnosis not present

## 2022-11-01 DIAGNOSIS — A419 Sepsis, unspecified organism: Secondary | ICD-10-CM | POA: Diagnosis not present

## 2022-11-01 DIAGNOSIS — R1314 Dysphagia, pharyngoesophageal phase: Secondary | ICD-10-CM | POA: Diagnosis not present

## 2022-11-01 DIAGNOSIS — E440 Moderate protein-calorie malnutrition: Secondary | ICD-10-CM | POA: Insufficient documentation

## 2022-11-01 LAB — GLUCOSE, CAPILLARY
Glucose-Capillary: 96 mg/dL (ref 70–99)
Glucose-Capillary: 83 mg/dL (ref 70–99)
Glucose-Capillary: 117 mg/dL — ABNORMAL HIGH (ref 70–99)
Glucose-Capillary: 139 mg/dL — ABNORMAL HIGH (ref 70–99)
Glucose-Capillary: 123 mg/dL — ABNORMAL HIGH (ref 70–99)

## 2022-11-01 LAB — CBC
HCT: 36.2 % — ABNORMAL LOW (ref 39.0–52.0)
Hemoglobin: 11.5 g/dL — ABNORMAL LOW (ref 13.0–17.0)
MCH: 26.4 pg (ref 26.0–34.0)
MCHC: 31.8 g/dL (ref 30.0–36.0)
MCV: 83.2 fL (ref 80.0–100.0)
Platelets: 232 10*3/uL (ref 150–400)
RBC: 4.35 MIL/uL (ref 4.22–5.81)
RDW: 15.5 % (ref 11.5–15.5)
WBC: 12.4 10*3/uL — ABNORMAL HIGH (ref 4.0–10.5)
nRBC: 0 % (ref 0.0–0.2)

## 2022-11-01 LAB — BASIC METABOLIC PANEL
Anion gap: 13 (ref 5–15)
BUN: 17 mg/dL (ref 8–23)
CO2: 29 mmol/L (ref 22–32)
Calcium: 8.3 mg/dL — ABNORMAL LOW (ref 8.9–10.3)
Chloride: 96 mmol/L — ABNORMAL LOW (ref 98–111)
Creatinine, Ser: 0.64 mg/dL (ref 0.61–1.24)
GFR, Estimated: >60 mL/min (ref >60)
Glucose, Bld: 83 mg/dL (ref 70–99)
Potassium: 3.3 mmol/L — ABNORMAL LOW (ref 3.5–5.1)
Sodium: 138 mmol/L (ref 135–145)

## 2022-11-01 LAB — PHOSPHORUS: Phosphorus: 3.4 mg/dL (ref 2.5–4.6)

## 2022-11-01 LAB — CULTURE, BLOOD (ROUTINE X 2)

## 2022-11-01 LAB — MAGNESIUM
Magnesium: 1.8 mg/dL (ref 1.7–2.4)
Magnesium: 2.1 mg/dL (ref 1.7–2.4)

## 2022-11-01 MED ORDER — JEVITY 1.2 CAL PO LIQD
1000.0000 mL | ORAL | Status: DC
  Administered 2022-11-01 – 2022-11-03 (×3): 1000 mL
  Filled 2022-11-01 (×5): qty 1000

## 2022-11-01 MED ORDER — DOCUSATE SODIUM 50 MG/5ML PO LIQD: 100.0000 mg | Freq: Two times a day (BID) | ORAL | Status: DC | PRN

## 2022-11-01 MED ORDER — HALOPERIDOL LACTATE 5 MG/ML IJ SOLN
2.0000 mg | INTRAMUSCULAR | Status: DC | PRN
  Administered 2022-11-01 – 2022-11-02 (×4): 2 mg via INTRAVENOUS
  Filled 2022-11-01 (×4): qty 1

## 2022-11-01 MED ORDER — FREE WATER
150.0000 mL | Freq: Four times a day (QID) | Status: DC
  Administered 2022-11-01 – 2022-11-03 (×8): 150 mL

## 2022-11-01 MED ORDER — MORPHINE SULFATE (PF) 2 MG/ML IV SOLN
2.0000 mg | INTRAVENOUS | Status: DC | PRN
  Administered 2022-11-01 – 2022-11-03 (×9): 2 mg via INTRAVENOUS
  Filled 2022-11-01 (×8): qty 1

## 2022-11-01 MED ORDER — OLANZAPINE 2.5 MG PO TABS
2.5000 mg | ORAL_TABLET | Freq: Every day | ORAL | Status: DC
  Administered 2022-11-01: 2.5 mg via ORAL
  Filled 2022-11-01 (×3): qty 1

## 2022-11-01 MED ORDER — LEVOTHYROXINE SODIUM 75 MCG PO TABS
175.0000 ug | ORAL_TABLET | Freq: Every day | ORAL | Status: DC
  Administered 2022-11-02 – 2022-11-03 (×2): 175 ug
  Filled 2022-11-01 (×2): qty 1

## 2022-11-01 MED ORDER — MAGNESIUM SULFATE 2 GM/50ML IV SOLN
2.0000 g | Freq: Once | INTRAVENOUS | Status: AC
  Administered 2022-11-01: 2 g via INTRAVENOUS
  Filled 2022-11-01: qty 50

## 2022-11-01 MED ORDER — ACETAMINOPHEN 160 MG/5ML PO SOLN: 650.0000 mg | Freq: Four times a day (QID) | ORAL | Status: DC

## 2022-11-01 MED ORDER — PROSOURCE TF20 ENFIT COMPATIBL EN LIQD
60.0000 mL | Freq: Every day | ENTERAL | Status: DC
  Administered 2022-11-01 – 2022-11-03 (×3): 60 mL
  Filled 2022-11-01 (×3): qty 60

## 2022-11-01 MED ORDER — ACETAMINOPHEN 160 MG/5ML PO SOLN
650.0000 mg | Freq: Four times a day (QID) | ORAL | Status: DC
  Administered 2022-11-01 – 2022-11-03 (×9): 650 mg
  Filled 2022-11-01 (×9): qty 20.3

## 2022-11-01 MED ORDER — DOXYCYCLINE HYCLATE 100 MG PO TABS
100.0000 mg | ORAL_TABLET | Freq: Two times a day (BID) | ORAL | Status: DC
  Administered 2022-11-01 – 2022-11-03 (×4): 100 mg
  Filled 2022-11-01 (×4): qty 1

## 2022-11-01 MED ORDER — POTASSIUM CHLORIDE 10 MEQ/50ML IV SOLN
10.0000 meq | INTRAVENOUS | Status: AC
  Administered 2022-11-01 (×6): 10 meq via INTRAVENOUS
  Filled 2022-11-01 (×6): qty 50

## 2022-11-01 NOTE — Consult Note (Signed)
Consultation Note Date: 11/01/2022   Patient Name: [name]  DOB: 1940-12-14  MRN: [id number]  Age / Sex: 82 y.o., male  PCP: Garlan Fillers, MD Referring Physician: Lynnell Catalan, MD  Reason for Consultation: Establishing goals of care  HPI/Patient Profile: 81 y.o. 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   Bagged by EMS for low sats and near coma.   Code status was reversed by wife and patient intubated for airway protection in ER.   CXR with LLL infiltrate. Head CT. ABG with severe CO2 retention.   Admitted for treatment and stabilization.  On further exploration with wife today she reports patient has had continued physical and functional decline over the past many months including difficulty swallowing  Family face treatment option decisions, advanced directive decisions and anticipatory care needs.  Clinical Assessment and Goals of Care:  This NP Lorinda Creed reviewed medical records, received report from team, assessed the patient and then meet at the patient's bedside along with his wife and family friend/James   to discuss diagnosis, prognosis, GOC, EOL wishes disposition and options.   Concept of Palliative Care was introduced as specialized medical care for people and their families living with serious illness.  If focuses on providing relief from the symptoms and stress of a serious illness.    The goal is to improve quality of life for both the patient and the family.    Values and goals of care important to patient and family were attempted to be elicited.  Created space and opportunity for wife  to explore thoughts and feelings regarding current medical situation.  Mrs. carle laque an understanding of  her husband's serious medical conditions. Patient recently was discharged from [facility] hospice services.  She and her husband have completed advance care planning documents in the past.  She will bring them in for scanning.  She is tearful at times as she verbalizes recognition of the possible poor outcomes of this hospitalization.  Concern over distress from visual hallucinations   A  discussion was had today regarding advanced directives.  Concepts specific to code status, artifical feeding and hydration, continued IV antibiotics and rehospitalization was had.    The difference between a aggressive medical intervention path  and a palliative comfort care path for this patient at this time was had.    MOST form reviewed.  She is familiar with the document.  A Hard Choices booklet is left for review.  We explore concept of adult failure to thrive and the limitations of medical interventions to prolong quality of life when the body does fail to thrive.  We reviewed hospice benefit; philosophy and eligibility   Natural trajectory and expectations at EOL were discussed.    Questions and concerns addressed.  Patient  encouraged to call with questions or concerns.     Patient's wife/Mrs. Ducker wishes to have  continued discussion with her 2 children [name] and Dorene Sorrow before making any other decisions regarding plan of care.   For now family is hopeful for improvement and are open to all offered and available medical interventions to prolong life.  PMT will continue to support holistically.  I plan to meet again with Mrs. Pearson  tomorrow afternoon.         :Life review: Mrs. Langston Masker shares her lifelong relationship with Mr. Bachand,  they met at age 25.  They have 2 children.  Mr. Rubendall was that "easygoing" half of the relationship.  He worked for [employer] for many years    NEXT OF KIN/wife/HPOA    SUMMARY OF RECOMMENDATIONS    Code Status/Advance Care Planning: DNR-previously  documented   Symptom Management:  Chronic pain: Tylenol 650 mg scheduled every 6 hours via tube Visual hallucinations: Zyprexa 2.5 mg nightly via tube  Palliative Prophylaxis:  Aspiration, Bowel Regimen, Delirium Protocol, and Frequent Pain Assessment  Additional Recommendations (Limitations, Scope, Preferences): Full Scope Treatment  Psycho-social/Spiritual:  Desire for further Chaplaincy support:no-declined Additional Recommendations: Education on Hospice  Prognosis:  Unable to determine  Discharge Planning: To Be Determined      Primary Diagnoses: Present on Admission:  Respiratory failure (HCC)  Community acquired pneumonia of left lower lobe of lung   I have reviewed the medical record, interviewed the patient and family, and examined the patient. The following aspects are pertinent.  Past Medical History:  Diagnosis Date   Arthritis    Barrett esophagus    Bronchitis    Carotid artery occlusion    COPD (chronic obstructive pulmonary disease) (HCC)    Diabetes mellitus without complication (HCC)    GERD (gastroesophageal reflux disease)    Headache    migraines   History of thyroid cancer 2008   Hypertension    Restless leg    Sleep apnea with use of continuous positive airway pressure (CPAP)    2011 [facility] , AHI  77cn central and obstrcutive. 16 cm water , 3 cm EPR.    Thoracic ascending aortic aneurysm (HCC)    4.2 cm ascending TAA 08/18/17 CTA. Annual imaging recommended.   thyroid ca dx'd 2009 or 2010   surg and radioactive isoptope   Ulcer    Peptic ulcer disease   Social History   Socioeconomic History   Marital status: Married    Spouse name: [name]   Number of children: 2   Years of education: COLLEGE   Highest education level: Not on file  Occupational History    Employer: RETIRED  Tobacco Use   Smoking status: Former    Years: 40    Types: Cigars, Cigarettes    Quit date: 06/19/2005    Years since quitting: 17.3   Smokeless  tobacco: Never   Tobacco comments:    smoked 4-6 cigars daily, only smoked cigarettes socially  Vaping Use   Vaping Use: Never used  Substance and Sexual Activity   Alcohol use: No   Drug use: No   Sexual activity: Not on file  Other Topics Concern   Not on file  Social History Narrative   Patient is married [name]) and lives at home with his wife.   Patient has two children.   Patient is retired.   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   Patient is right-handed.   Patient drinks three cups of coffee per day.   Social Determinants of Corporate investment banker  Strain: Not on file  Food Insecurity: No Food Insecurity (10/29/2022)   Hunger Vital Sign    Worried About Running Out of Food in the Last Year: Never true    Ran Out of Food in the Last Year: Never true  Transportation Needs: Patient Unable To Answer (10/29/2022)   PRAPARE - Administrator, Civil Service (Medical): Patient unable to answer    Lack of Transportation (Non-Medical): Patient unable to answer  Physical Activity: Not on file  Stress: Not on file  Social Connections: Not on file   Family History  Problem Relation Age of Onset   COPD Mother    Hyperlipidemia Mother    Hypertension Mother    Diabetes Father    Kidney disease Father        ESRD   Hypertension Father    Cancer Father    Hyperlipidemia Father    Scheduled Meds:  acetaminophen (TYLENOL) oral liquid 160 mg/5 mL  650 mg Oral Q6H   arformoterol  15 mcg Nebulization BID   budesonide (PULMICORT) nebulizer solution  0.5 mg Nebulization BID   Chlorhexidine Gluconate Cloth  6 each Topical Daily   enoxaparin (LOVENOX) injection  40 mg Subcutaneous Q24H   Gerhardt's butt cream   Topical QID   latanoprost  1 drop Both Eyes QHS   methylPREDNISolone (SOLU-MEDROL) injection  40 mg Intravenous Daily   mouth rinse  15 mL Mouth Rinse 4 times per day   oxymetazoline  1 spray Each Nare BID   pantoprazole (PROTONIX) IV   40 mg Intravenous QHS   revefenacin  175 mcg Nebulization Daily   sodium chloride flush  10-40 mL Intracatheter Q12H   Continuous Infusions:  sodium chloride Stopped (10/31/22 0855)   cefTRIAXone (ROCEPHIN)  IV 2 g (11/01/22 1010)   doxycycline (VIBRAMYCIN) IV Stopped (10/31/22 2347)   methocarbamol (ROBAXIN) IV Stopped (10/31/22 1358)   potassium chloride 50 mL/hr at 11/01/22 1000   PRN Meds:.albuterol, docusate sodium, methocarbamol (ROBAXIN) IV, ondansetron (ZOFRAN) IV, mouth rinse, polyethylene glycol, sodium chloride flush Medications Prior to Admission:  Prior to Admission medications   Medication Sig Start Date End Date Taking? Authorizing Provider  acetaminophen (TYLENOL) 325 MG tablet Take 2 tablets (650 mg total) by mouth every 6 (six) hours as needed (pain or temperature). 10/24/19  Yes Angiulli, Mcarthur Rossetti, PA-C  albuterol (VENTOLIN HFA) 108 (90 Base) MCG/ACT inhaler Inhale 1 puff into the lungs every 4 (four) hours as needed for wheezing or shortness of breath. 11/20/19  Yes Angiulli, Mcarthur Rossetti, PA-C  baclofen (LIORESAL) 20 MG tablet Take 2 tablets (40 mg total) by mouth 4 (four) times daily. 10/13/22  Yes Lovorn, Aundra Millet, MD  gabapentin (NEURONTIN) 100 MG capsule Take 100 mg by mouth 3 (three) times daily. 10/10/22  Yes [provider]  gabapentin (NEURONTIN) 300 MG capsule Take 1 capsule (300 mg total) by mouth at bedtime. 07/11/21  Yes Dohmeier, Porfirio Mylar, MD  guaiFENesin (MUCINEX) 600 MG 12 hr tablet Take 2 tablets (1,200 mg total) by mouth 2 (two) times daily. 11/20/19  Yes Angiulli, Mcarthur Rossetti, PA-C  HYDROcodone-acetaminophen (NORCO) 7.5-325 MG tablet Take 1 tablet by mouth every 6 (six) hours as needed. Taking over from [facility] 10/13/22  Yes Lovorn, Aundra Millet, MD  hyoscyamine (LEVSIN SL) 0.125 MG SL tablet Take 0.25 mg by mouth every 4 (four) hours as needed for cramping. 09/09/21  Yes [provider]  ipratropium-albuterol (DUONEB) 0.5-2.5 (3) MG/3ML SOLN Take 3 mLs by  nebulization 2 (two)  times daily. 12/06/20  Yes [provider]  levothyroxine (SYNTHROID) 175 MCG tablet Take 175 mcg by mouth daily. 02/25/20  Yes [provider]  Morphine Sulfate (MORPHINE CONCENTRATE) 10 mg / 0.5 ml concentrated solution Take 10 mg by mouth every 6 (six) hours as needed for moderate pain or severe pain. 08/08/21  Yes [provider]  pantoprazole (PROTONIX) 40 MG tablet Take 1 tablet (40 mg total) by mouth every evening. Patient taking differently: Take 40 mg by mouth daily as needed (indigestion). 11/20/19  Yes Angiulli, Mcarthur Rossetti, PA-C  phenylephrine (NEO-SYNEPHRINE) 0.5 % nasal solution Place 1 drop into both nostrils every 6 (six) hours as needed for congestion.   Yes [provider]  polyethylene glycol powder (MIRALAX) 17 GM/SCOOP powder Take 0.5 Containers by mouth daily. 17 gm every other day   Yes [provider]  rizatriptan (MAXALT) 10 MG tablet Take 10 mg by mouth as needed for migraine. 04/08/20  Yes [provider]  senna-docusate (SENOKOT-S) 8.6-50 MG tablet Take 2 tablets by mouth 2 (two) times daily. Patient taking differently: Take 2 tablets by mouth at bedtime. 11/10/19  Yes Kathlen Mody, MD  spironolactone (ALDACTONE) 25 MG tablet Take 25 mg by mouth once. 01/22/20  Yes [provider]  tiZANidine (ZANAFLEX) 4 MG tablet Take 0.5-1 tablets (2-4 mg total) by mouth 2 (two) times daily as needed for muscle spasms. In addition to Robaxin and baclofen- due to SCI 10/13/22  Yes Lovorn, Aundra Millet, MD  Travoprost, BAK Free, (TRAVATAN) 0.004 % SOLN ophthalmic solution 1 drop at bedtime.   Yes [provider]  TRELEGY ELLIPTA 100-62.5-25 MCG/INH AEPB Inhale 1 puff into the lungs daily.  05/01/18  Yes [provider]  ATIVAN 0.5 MG tablet Take 0.5 mg by mouth every 4 (four) hours as needed. 08/08/21   [provider]  nitroGLYCERIN (NITROSTAT) 0.4 MG SL tablet Place 0.4 mg under the tongue every 5  (five) minutes as needed for chest pain.    [provider]   Allergies  Allergen Reactions   Ciprofloxacin     Other reaction(s): Other (See Comments) Drug interactions   Review of Systems  Unable to perform ROS: Mental status change    Physical Exam Constitutional:      Appearance: He is morbidly obese. He is ill-appearing.     Interventions: Nasal cannula in place.  Cardiovascular:     Rate and Rhythm: Normal rate.  Pulmonary:     Effort: Tachypnea present.     Comments: Audible throat secretions, weak cough and unable to clear    Vital Signs: BP (!) 159/98   Pulse 87   Temp 99.4 F (37.4 C) (Axillary)   Resp (!) 27   Ht 5\' 7"  (1.702 m)   Wt 79.1 kg   SpO2 91%   BMI 27.31 kg/m  Pain Scale: 0-10   Pain Score: 0-No pain   SpO2: SpO2: 91 % O2 Device:SpO2: 91 % O2 Flow Rate: .O2 Flow Rate (L/min): 4 L/min  IO: Intake/output summary:  Intake/Output Summary (Last 24 hours) at 11/01/2022 1014 Last data filed at 11/01/2022 1000 Gross per 24 hour  Intake 1357.91 ml  Output 2875 ml  Net -1517.09 ml    LBM: Last BM Date : 10/31/22 Baseline Weight: Weight: 79.4 kg Most recent weight: Weight: 79.1 kg     Palliative Assessment/Data: 30 % at best     Discussed with Dr. Denese Killings and bedside RN  Time: 75  minutes  Signed  by: Lorinda Creed, NP   Please contact Palliative Medicine Team phone at [phone] for questions and concerns.  For individual provider: See Loretha Stapler

## 2022-11-01 NOTE — Progress Notes (Signed)
[facility] ADULT ICU REPLACEMENT PROTOCOL   The patient does apply for the [facility] Adult ICU Electrolyte Replacment Protocol based on the criteria listed below:   1.Exclusion criteria: TCTS, ECMO, Dialysis, and Myasthenia Gravis patients 2. Is GFR >/= 30 ml/min? Yes.    Patient's GFR today is >60 3. Is SCr </= 2? Yes.   Patient's SCr is 0.64 mg/dL 4. Did SCr increase >/= 0.5 in 24 hours? No. 5.Pt's weight >40kg  Yes.   6. Abnormal electrolyte(s): K 3.3, mag 1.8  7. Electrolytes replaced per protocol 8.  Call MD STAT for K+ </= 2.5, Phos </= 1, or Mag </= 1 Physician:    Markus Daft A 11/01/2022 6:13 AM

## 2022-11-01 NOTE — Progress Notes (Signed)
eLink Physician-Brief Progress Note Patient Name: [name] DOB: 1941/03/02 MRN: [id number]   Date of Service  11/01/2022  HPI/Events of Note  Patient currently on 100 % non re-breather mask with saturation of 97 %.  eICU Interventions  Will trial patient on Optiflow targeting saturation > 92 %.        Thomasene Lot [name] 11/01/2022, 11:28 PM

## 2022-11-01 NOTE — Procedures (Signed)
Cortrak  Person Inserting Tube:  Chele Cornell C, RD Tube Type:  Cortrak - 43 inches Tube Size:  10 Tube Location:  Left nare Secured by: Bridle Technique Used to Measure Tube Placement:  Marking at nare/corner of mouth Cortrak Secured At:  67 cm   Cortrak Tube Team Note:  Consult received to place a Cortrak feeding tube.   X-ray is required, abdominal x-ray has been ordered by the Cortrak team. Please confirm tube placement before using the Cortrak tube.   If the tube becomes dislodged please keep the tube and contact the Cortrak team at [email or web address] for replacement.  If after hours and replacement cannot be delayed, place a NG tube and confirm placement with an abdominal x-ray.    Krisinda Giovanni P., RD, LDN, CNSC See AMiON for contact information    

## 2022-11-01 NOTE — Progress Notes (Addendum)
SLP Cancellation Note  Patient Details Name: [name] MRN: [id number] DOB: 27-Jan-1941   Cancelled treatment:        Pt has now had cortrak placed for severe-profound dysphagia.  Spoke briefly with NP about pt's swallow function and prognosis. Pt's MBS with anatomical changes which are resistant to amelioration including cervical hardware protruding into hypopharynx preventing epiglottic inversion, and hx of H&N rad for thyroid cancer in 2009, which unfortunately, along with pt's complex medical hx, make him a very poor swallowing therapy candidate.  SLP will sign off at this time.  Please reconsult ST if needed.   Kerrie Pleasure, MA, CCC-SLP Acute Rehabilitation Services Office: [phone] 11/01/2022, 9:55 AM

## 2022-11-01 NOTE — Progress Notes (Signed)
Called to room.  Pt was receiving bedside care and suddenly desaturated to 70s.  More confused.  Had to be placed on NRB On my arrival sats 96% on 100% NRB Breath sounds decreased sig on right but currently no  accessory use.  Plan Cancel transfer Place nasal trumpet NTS PRN CXR now  Will need to continue to discuss this w/ family. I fear that this is not going to get better   Simonne Martinet ACNP-BC [facility] Pulmonary/Critical Care Pager # [phone] OR # [phone] if no answer

## 2022-11-01 NOTE — Progress Notes (Signed)
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  Bagged by EMS for low sats and near coma.  Code status was reversed by wife and patient intubated for airway protection in ER.  CXR with LLL infiltrate.  Head CT pending.  ABG with severe CO2 retention.  Bps borderline. PCCM to admit.  Pertinent  Medical History  COPD Wheelchair bound HOT Severe muscle wasting, spasticity Sacral pressure ulcer  Significant Hospital Events: Including procedures, antibiotic start and stop dates in addition to other pertinent events   5/12 admit, code status updated to DNR 5/13 extubated  5/14 abx narrowed. severe to profound pharyngeal phase dysphagia as per this modified barium swallow study   Interim History / Subjective:  No distress Objective   Blood pressure (!) 144/88, pulse 80, temperature 100.3 F (37.9 C), temperature source Oral, resp. rate 18, height 5\' 7"  (1.702 m), weight 79.4 kg, SpO2 97 %.    Vent Mode: PSV;CPAP FiO2 (%):  [50 %] 50 % PEEP:  [5 cmH20] 5 cmH20 Pressure Support:  [5 cmH20] 5 cmH20   Intake/Output Summary (Last 24 hours) at 10/31/2022 0827 Last data filed at 10/31/2022 0700 Gross per 24 hour  Intake 913.57 ml  Output 1627 ml  Net -713.43 ml   Filed Weights   10/29/22 1010 10/29/22 1930 10/30/22 0500  Weight: 79.4 kg 79.4 kg 79.4 kg    Examination: General chronically ill appearing 82 year old male. Sitting up in bed no distress HENT NCAT no JVD MM are dry  Pulm diffuse scattered rhonchi. No accessory use on 4 lpm. His cough is very weak Pcxr w/ persistent LLL volume loss. No sig change from prior film Card rrr Abd soft Ext warm, dry LE  edema Neuro awake, oriented. Partial paraplegia  Resolved Hospital Problem list   Bradycardia  Septic shock 2/2 CAP, resolved  Assessment & Plan:   Acute on chronic hypoxic and hypercarbic resp failure LLL CAP vs aspiration (favor aspiration given MBS findings) Advanced COPD / Sleep apnea overlap, on CPAP  - strep urine ag neg Plan Complete 5d doxy and ceftriaxone  Change solumedrop to Pred VT and taper Continue aggressive pulmonary hygiene, IS, flutter cont brovana, pulmicort, yupelri, prn albuterol NPO w/ further GOC discussion  CPAP at HS   HTN w/ concern about w/d Plan Monitor   DM Plan Monitor (ssi stopped yesterday d/t hypoglycemia)   Chronic pain  Arthritis  FTT in adult Physical debility Incomplete paraplegia  GOC DNR status  Multiple DTI- POA - on home ativan (no recent refills per pharmacy), zanaflex, baclofen, gabapentin, norco, morphine, robaxin> given tachycardia, more hypertensive and tremor> concern for withdrawal.  Respiratory status tenuous at baseline and code status remains ok for intubation otherwise DNR.  Added back prn robaxin IV Plan NPO On-going PMT discussion    Hypothyroid Plan Can resume via tube once Cortrak placed   Hypokalemia Plan Replace and recheck    Best Practice (right click and "Reselect all SmartList Selections" daily)   Diet/type: NPO DVT prophylaxis: LMWH  GI prophylaxis: PPI Lines: Central line- PICC Foley:  Yes, and it is still needed> chronic Code Status:  limited Last date of multidisciplinary goals of care  discussion [Called and updated wife 5/12]   Ready to move out of ICU will get cortrak. Needs on-going GOC discussion. He does not comprehend how his dysphagia is leading to his decline.    Simonne Martinet ACNP-BC [facility] Pulmonary/Critical Care Pager # [phone] OR # [phone] if no answer

## 2022-11-01 NOTE — Progress Notes (Signed)
RT NTS pt x2 w/ RN at bedside.

## 2022-11-01 NOTE — Progress Notes (Addendum)
I met with the patient's wife at bedside.  Unfortunately there is very little I can offer him therapeutically.  He has severe dysphagia, poor cough mechanics, and recurrent mucous plugging with recurrent respiratory failure.  Adding to this now he is having fairly significant hallucinations.  We talked about in some cases we can use noninvasive positive pressure ventilation however [name] has been intolerant of this, and given his cough mechanics I do not think this is a good option for him anyway.  The only other alternative would be intubation, mechanical ventilation, and eventual tracheostomy with PEG.  [name] did not feel like that route of care would be in line with his values.  I told her I think we are very close to being at the point where we need to consider transitioning to comfort care and hospice.  She was excepting but tearful I encouraged her to reach out to the rest of her family for support she will be calling them this afternoon Plan Continue supplemental oxygen N.p.o. for now, keep the core track in place NTS as needed No intubation Full DO NOT RESUSCITATE If he continues to decline we will transition to full comfort care   Simonne Martinet ACNP-BC [facility] Pulmonary/Critical Care Pager # [phone] OR # [phone] if no answer

## 2022-11-01 NOTE — Progress Notes (Signed)
Nutrition Follow-up  DOCUMENTATION CODES:   Non-severe (moderate) malnutrition in context of chronic illness  INTERVENTION:   Tube Feeding via Cortrak Jevity 1.2 at 60 ml/hr Begin TF at 30 ml/hr, titrate by 10 mL q 8 hours until goal rate of 60 ml/hr Pro-Source TF20 60 mL This provides 1808 kcals, 110 g of protein and 1166 mL of free water  Add free water 150 mL q 6 hours; total free water with TF at goal rate is 1766 mL. This does not include additional free water flushes for meds, tube maintenance, etc  NUTRITION DIAGNOSIS:   Moderate Malnutrition related to wound healing, catabolic illness (COPD) as evidenced by mild fat depletion, mild muscle depletion, moderate muscle depletion.  Being addressed via TF  GOAL:   Patient will meet greater than or equal to 90% of their needs  Progressing  MONITOR:   TF tolerance, I & O's, Skin, Weight trends  REASON FOR ASSESSMENT:   Ventilator, Malnutrition Screening Tool    ASSESSMENT:   82 year old male with PMHx of incomplete paraplegia now near bed bound and wheel chair bound, HTN, GERD, Barrett esophagus, DM, severe COPD, sleep apnea, thyroid cancer s/p thyroidectomy 2008 who is admitted with respiratory failure from left lower lobe CAP and COPD exacerbation. Pt was previously in hospice care.  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 Pt alert, pleasant but also having visual hallucinations. Pt currently seeing a cat in the rafters, pt has also been seeing bugs, boxes, etc  NPO, noted SLP has signed off- "patient's current dysphagia is result of progression of his chronic dysphagia and unfortunately, likelihood of signficant improvement in his swallow function is poor". Wife and pt indicate that pt had mashed potatoes, milk, ice cream yesterday  Cortrak placed today, Dietitian Consult received for TF initiation and management  Pt with baseline paralegia; pt with  muscle spasms in all extremities, pt also able to move at least left leg on command. Pt is non-abmulatory  Pt deals with chronic constipation, usually has 1 BM a week. Pt takes miralax in AM and senna at night daily. +BM medium type 6 stool yesterday  Prior to admission, pt eating 3 small meals per day. Pt with decreased appetite but still eating, just smaller portions Breakfast: Eggo waffle and an egg Lunch: Pimento Cheese sandwich Dinner: Fish/Crab Cake with mashed potatoes or sweet potato Beverages: 1 glass of whole milk in AM, 1 glass of chocolate milk in the evening. Pt "allowed" 1 Pepsi per day. Pt would also drink juices. Pt had been feeding self at home.   Pt had been taking an MVI but was "taken off of it" when placed on Hospice.   Labs: potassium 3.3 (L), CBGs 75-103 Meds: solumedrol, KCl, mag sulfate  NUTRITION - FOCUSED PHYSICAL EXAM:  Flowsheet Row Most Recent Value  Orbital Region No depletion  Upper Arm Region Mild depletion  Thoracic and Lumbar Region Mild depletion  Buccal Region No depletion  Temple Region Mild depletion  Clavicle Bone Region Mild depletion  Clavicle and Acromion Bone Region Moderate depletion  Scapular Bone Region Moderate depletion  Dorsal Hand Moderate depletion  Patellar Region Severe depletion  Anterior Thigh Region Severe depletion  Posterior Calf Region Severe depletion  Edema (RD Assessment) Mild  Hair Reviewed  Eyes Reviewed  Mouth Reviewed  [edentulous]  Skin Reviewed  [psoriasis to scalp]  Nails Reviewed           Diet  Order:   Diet Order             Diet NPO time specified  Diet effective now                   EDUCATION NEEDS:   Education needs have been addressed  Skin:  Skin Assessment: Skin Integrity Issues: Skin Integrity Issues:: Stage II Stage II: bilateral buttocks, coccyx  Last BM:  5/14  Height:   Ht Readings from Last 1 Encounters:  10/29/22 5\' 7"  (1.702 m)    Weight:   Wt Readings from  Last 1 Encounters:  11/01/22 79.1 kg    BMI:  Body mass index is 27.31 kg/m.  Estimated Nutritional Needs:   Kcal:  1750-1950 kcals  Protein:  90-110 g  Fluid:  >/= 1.8 L   Romelle Starcher MS, RDN, LDN, CNSC Registered Dietitian 3 Clinical Nutrition RD Pager and On-Call Pager Number Located in [place]

## 2022-11-02 ENCOUNTER — Inpatient Hospital Stay (HOSPITAL_COMMUNITY): Payer: Medicare Other

## 2022-11-02 DIAGNOSIS — J9602 Acute respiratory failure with hypercapnia: Secondary | ICD-10-CM

## 2022-11-02 DIAGNOSIS — A419 Sepsis, unspecified organism: Principal | ICD-10-CM

## 2022-11-02 DIAGNOSIS — J9621 Acute and chronic respiratory failure with hypoxia: Secondary | ICD-10-CM | POA: Diagnosis not present

## 2022-11-02 DIAGNOSIS — J9620 Acute and chronic respiratory failure, unspecified whether with hypoxia or hypercapnia: Secondary | ICD-10-CM

## 2022-11-02 DIAGNOSIS — R6520 Severe sepsis without septic shock: Secondary | ICD-10-CM

## 2022-11-02 DIAGNOSIS — E440 Moderate protein-calorie malnutrition: Secondary | ICD-10-CM

## 2022-11-02 DIAGNOSIS — J189 Pneumonia, unspecified organism: Secondary | ICD-10-CM

## 2022-11-02 LAB — BASIC METABOLIC PANEL
Anion gap: 10 (ref 5–15)
BUN: 19 mg/dL (ref 8–23)
CO2: 31 mmol/L (ref 22–32)
Calcium: 8.6 mg/dL — ABNORMAL LOW (ref 8.9–10.3)
Chloride: 95 mmol/L — ABNORMAL LOW (ref 98–111)
Creatinine, Ser: 0.55 mg/dL — ABNORMAL LOW (ref 0.61–1.24)
GFR, Estimated: >60 mL/min (ref >60)
Glucose, Bld: 124 mg/dL — ABNORMAL HIGH (ref 70–99)
Potassium: 3.8 mmol/L (ref 3.5–5.1)
Sodium: 136 mmol/L (ref 135–145)

## 2022-11-02 LAB — GLUCOSE, CAPILLARY
Glucose-Capillary: 140 mg/dL — ABNORMAL HIGH (ref 70–99)
Glucose-Capillary: 149 mg/dL — ABNORMAL HIGH (ref 70–99)
Glucose-Capillary: 204 mg/dL — ABNORMAL HIGH (ref 70–99)

## 2022-11-02 LAB — CBC
HCT: 38.1 % — ABNORMAL LOW (ref 39.0–52.0)
Hemoglobin: 11.9 g/dL — ABNORMAL LOW (ref 13.0–17.0)
MCH: 26.4 pg (ref 26.0–34.0)
MCHC: 31.2 g/dL (ref 30.0–36.0)
MCV: 84.7 fL (ref 80.0–100.0)
Platelets: 214 10*3/uL (ref 150–400)
RBC: 4.5 MIL/uL (ref 4.22–5.81)
RDW: 15.2 % (ref 11.5–15.5)
WBC: 10.4 10*3/uL (ref 4.0–10.5)
nRBC: 0 % (ref 0.0–0.2)

## 2022-11-02 LAB — PHOSPHORUS: Phosphorus: 2.9 mg/dL (ref 2.5–4.6)

## 2022-11-02 LAB — MAGNESIUM: Magnesium: 2.1 mg/dL (ref 1.7–2.4)

## 2022-11-02 LAB — CULTURE, BLOOD (ROUTINE X 2): Culture: NO GROWTH

## 2022-11-02 MED ORDER — DILTIAZEM HCL 30 MG PO TABS
30.0000 mg | ORAL_TABLET | Freq: Four times a day (QID) | ORAL | Status: DC
  Administered 2022-11-02 – 2022-11-03 (×3): 30 mg via ORAL
  Filled 2022-11-02 (×3): qty 1

## 2022-11-02 MED ORDER — OLANZAPINE 5 MG PO TABS
5.0000 mg | ORAL_TABLET | Freq: Every day | ORAL | Status: DC
  Administered 2022-11-02: 5 mg via ORAL
  Filled 2022-11-02 (×2): qty 1

## 2022-11-02 MED ORDER — DILTIAZEM HCL-DEXTROSE 125-5 MG/125ML-% IV SOLN (PREMIX)
5.0000 mg/h | INTRAVENOUS | Status: AC
  Administered 2022-11-02: 5 mg/h via INTRAVENOUS
  Filled 2022-11-02: qty 125

## 2022-11-02 MED ORDER — DILTIAZEM LOAD VIA INFUSION
10.0000 mg | Freq: Once | INTRAVENOUS | Status: AC
  Administered 2022-11-02: 10 mg via INTRAVENOUS
  Filled 2022-11-02: qty 10

## 2022-11-02 MED ORDER — SODIUM CHLORIDE 0.9 % IV BOLUS: 1000.0000 mL | Freq: Once | INTRAVENOUS | Status: DC

## 2022-11-02 NOTE — Progress Notes (Signed)
Appears comfortable.  Will transfer to progressive.  No escalation. Family understands that if he declines further our next step is to transition to full comfort and morphine infusion   Simonne Martinet ACNP-BC [facility] Pulmonary/Critical Care Pager # [phone] OR # [phone] if no answer

## 2022-11-02 NOTE — Progress Notes (Signed)
eLink Physician-Brief Progress Note Patient Name: [name] DOB: Oct 24, 1940 MRN: [id number]   Date of Service  11/02/2022  HPI/Events of Note  Patient went into atrial fibrillation with RVR (BP 148/86, MAP 105, HR 150's)  eICU Interventions  Cardizem 10 mg iv bolus followed by Cardizem gtt ordered, electrolytes check ordered.        Thomasene Lot Leslieann Whisman 11/02/2022, 3:29 AM

## 2022-11-02 NOTE — Plan of Care (Signed)
Patient was to be transitioned to the [facility] team this a.m. but Case was discussed with the critical care nurse practitioner Zenia Resides and patient decompensated overnight further.  This morning he spiked a temperature and went into A-fib with RVR requiring a diltiazem drip.  Critical care came by and spoke with the family and the patient after further goals of care discussion a decision was made to transition the patient towards comfort.  Labs that I ordered for this patient have been discontinued given that patient is being transitioned towards comfort care because he isn't tolerating the non-re breather and HR is increasing so Critical Care has now scheduled hourly morphine pushes and haldol if needed.  Patient is at extremely high risk for further decompensation and inpatient death and per discussion with the nurse practitioner Zenia Resides of critical care, the critical care team will manage the patient today and [facility] will not get involved in his care today but will assume care tomorrow morning if patient is still alive.

## 2022-11-02 NOTE — Progress Notes (Signed)
Patient ID: [name], male   DOB: March 21, 1941, 82 y.o.   MRN: [id number]    Progress Note from the Palliative Medicine Team at [facility]   Patient Name: [name]        Date: 11/02/2022 DOB: Jan 24, 1941  Age: 82 y.o. MRN#: [id number] Attending Physician: Lynnell Catalan, MD Primary Care Physician: Garlan Fillers, MD Admit Date: 10/29/2022   Medical records reviewed, assessed patient     82 y.o. 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    Code status was reversed by wife and patient intubated for airway protection in ER.    CXR with LLL infiltrate. Head CT. ABG with severe CO2 retention.    Admitted for treatment and stabilization.  Chronic aspiration due to prior ACDF and neck radiation.  Swallowing further exacerbated by recent intubation.  Seen by SLP who did not deem him safe for oral intake.  Increasing respiratory distress due to recurrent aspiration.   -No modifiable factors to stop him from aspirating. Prognosis is continued and more frequent episodes of aspiration in a downward spiral.    Advanced COPD with poor functional status. Now DNR/DNI.   - Morphine for air hunger.    Atrial fibrillation with RVR. Well controlled on diltiazem IV.  -will transition to enteral Cardizem to allow for transfer to Palliative .    Acute hypoxic respiratory failure secondary to left lower lobe community-acquired pneumonia.  On antibiotic therapy. -Complete 7 days antibiotic treatment for community-acquired pneumonia. -Continue current bronchodilators -Short course steroids.  Anders Simmonds NP/CCM began transition to a more comfort approach yesterday afternoon in conversation with wife.  Follow up today for palliative medicine needs and emotional support.  Patient is less responsive today, with non-re  breather. Wife and friend/James at bedside.  Continued conversation regarding current medical situation.  Wife verbalizes an understanding "I know he won't get better from this"        Education offered,  I detailed  a comfort path liberating patient from high flow oxygen as a life prolonging measures, ensuring comfort with mediations and excellent nursing care.    Mrs Depa desires to continue all current interventions, she speaks to waiting for her son to get back to town on Sunday, before any further changes.  Discussed with CCM/Pete Tanja Port NP, he is in close communication with family.        I informed Mrs Slavey that I will be out of the hospital until Monday, and will f/u at that time if patient  is still hospitalized.  Call PMT team phone for needs  # [phone]   Education offered today regarding  the importance of continued conversation with family and the  medical providers regarding overall plan of care and treatment options,  ensuring decisions are within the context of the patients values and GOCs.  Questions and concerns addressed     Time: 50 minutes  Detailed review of medical records ( labs, imaging, vital signs), medically appropriate exam ( MS, skin, resp)   discussed with treatment team, counseling and education to patient, family, staff, documenting clinical information, medication management, coordination of care    Lorinda Creed NP  Palliative Medicine Team Team Phone # [phone] Pager [phone]

## 2022-11-02 NOTE — Progress Notes (Addendum)
NAME:  BERMAN GURSKI, MRN:  [id number], DOB:  [birth year], LOS: 4 ADMISSION DATE:  10/29/2022, CONSULTATION DATE:  10/29/22 REFERRING MD:  Anitra Lauth, CHIEF COMPLAINT:  SOB   History of Present Illness:  82 year old man hx incomplete paraplegia now near wheelchair bound, severe COPD/OSA overlap on HOT/CPAP previously on hospice presenting from home for progressive SOB.  Bagged by EMS for low sats and near coma.  Code status was reversed by wife and patient intubated for airway protection in ER.  CXR with LLL infiltrate.  Head CT pending.  ABG with severe CO2 retention.  Bps borderline. PCCM to admit.  Pertinent  Medical History  COPD Wheelchair bound HOT Severe muscle wasting, spasticity Sacral pressure ulcer  Significant Hospital Events: Including procedures, antibiotic start and stop dates in addition to other pertinent events   5/12 admit, code status updated to DNR 5/13 extubated  5/14 abx narrowed. severe to profound pharyngeal phase dysphagia as per this modified barium swallow study  5/15: Clinically declined.  Nothing further to add.  Added as needed morphine, as needed Haldol, and scheduled Zyprexa for terminal delirium 5/16 developed A-fib with RVR placed on calcium channel blocker infusion.  Increasing oxygen need in spite of high flow oxygen.  Transitioning to comfort care with no escalation  Interim History / Subjective:  Marked distress today Objective   Blood pressure 108/67, pulse (Abnormal) 105, temperature (Abnormal) 100.8 F (38.2 C), resp. rate (Abnormal) 26, height 5\' 7"  (1.702 m), weight 78.6 kg, SpO2 95 %.    FiO2 (%):  [75 %-100 %] 100 %   Intake/Output Summary (Last 24 hours) at 11/02/2022 0919 Last data filed at 11/02/2022 0600 Gross per 24 hour  Intake 1280.66 ml  Output 1855 ml  Net -574.34 ml   Filed Weights   10/30/22 0500 11/01/22 0456 11/02/22 0416  Weight: 79.4 kg 79.1 kg 78.6 kg    Examination: General 82 year old male patient currently with  marked increased work of breathing on high flow oxygen mask HEENT normocephalic atraumatic rhonchorous upper airway sounds Pulmonary: Diffuse scattered rhonchi diminished on the right Cardiac: Atrial fibrillation with rapid ventricular response Abdomen: Soft nontender Extremities: Dependent edema Neuro: Awake, confused and hallucinating.  Partial paraplegia at baseline  Resolved Hospital Problem list   Bradycardia  Septic shock 2/2 CAP, resolved  Assessment & Plan:   Acute on chronic hypoxic and hypercarbic resp failure LLL CAP vs aspiration (favor aspiration given MBS findings) Advanced COPD / Sleep apnea overlap, on CPAP  Comfort care - strep urine ag neg Plan Complete 5d doxy and ceftriaxone  Cont solumed at 40mg  Continue supplemental oxygen and PRN NTS cont brovana, pulmicort, yupelri, prn albuterol NPO PRN morphine  Comfort care focus. If deteriorates more will transition to morphine gtt  Terminal delirium Plan As needed Haldol Zyprexa  AF w/ RVR Plan CCB infusion  Tele  Not candidate for [facility]  HTN w/ concern about w/d Plan Monitor   DM Plan Cont to monitor   Chronic pain  Arthritis  FTT in adult Physical debility Incomplete paraplegia  GOC DNR status  Multiple DTI- POA Plan NPO On-going PMT discussion    Hypothyroid Plan  Cortrak placed, continue synthroid     Best Practice (right click and "Reselect all SmartList Selections" daily)   Diet/type: NPO DVT prophylaxis: LMWH  GI prophylaxis: PPI Lines: Central line- PICC Foley:  Yes, and it is still needed> chronic Code Status:  limited Last date of multidisciplinary goals of care discussion [Called and  updated wife 5/12]  If we can manage his symptoms well we can move him out of the ICU right now I am concerned he is actively dying  Simonne Martinet ACNP-BC [facility] Pulmonary/Critical Care Pager # [phone] OR # [phone] if no answer

## 2022-11-02 NOTE — Progress Notes (Signed)
Tried pt on Heated high flow but pt oxygen kept dropping due to him being a mouth breather. Pt is currently on non- rebreather  sating 88-91.  Pt has history of COPD. NO resp distress noted at this time. Will continue to monitor.

## 2022-11-02 NOTE — Progress Notes (Signed)
Pt refusing CPAP at this time.

## 2022-11-03 DIAGNOSIS — E440 Moderate protein-calorie malnutrition: Secondary | ICD-10-CM | POA: Diagnosis not present

## 2022-11-03 DIAGNOSIS — A419 Sepsis, unspecified organism: Secondary | ICD-10-CM | POA: Diagnosis not present

## 2022-11-03 DIAGNOSIS — J189 Pneumonia, unspecified organism: Secondary | ICD-10-CM | POA: Diagnosis not present

## 2022-11-03 DIAGNOSIS — Z7189 Other specified counseling: Secondary | ICD-10-CM

## 2022-11-03 DIAGNOSIS — J9621 Acute and chronic respiratory failure with hypoxia: Secondary | ICD-10-CM | POA: Diagnosis not present

## 2022-11-03 DIAGNOSIS — Z515 Encounter for palliative care: Secondary | ICD-10-CM | POA: Diagnosis not present

## 2022-11-03 LAB — CULTURE, BLOOD (ROUTINE X 2)
Culture: NO GROWTH
Special Requests: ADEQUATE

## 2022-11-03 MED ORDER — LORAZEPAM 2 MG/ML IJ SOLN: 0.5000 mg | INTRAMUSCULAR | Status: DC | PRN

## 2022-11-03 MED ORDER — DILTIAZEM HCL 30 MG PO TABS: 30.0000 mg | ORAL_TABLET | Freq: Four times a day (QID) | ORAL | Status: DC

## 2022-11-03 MED ORDER — GLYCOPYRROLATE 0.2 MG/ML IJ SOLN: 0.2000 mg | INTRAMUSCULAR | Status: DC | PRN

## 2022-11-03 MED ORDER — BIOTENE DRY MOUTH MT LIQD: 15.0000 mL | OROMUCOSAL | Status: DC | PRN

## 2022-11-03 MED ORDER — GLYCOPYRROLATE 1 MG PO TABS: 1.0000 mg | ORAL_TABLET | ORAL | Status: DC | PRN

## 2022-11-03 MED ORDER — MORPHINE 100MG IN NS 100ML (1MG/ML) PREMIX INFUSION
1.0000 mg/h | INTRAVENOUS | Status: DC
  Administered 2022-11-03: 1 mg/h via INTRAVENOUS

## 2022-11-03 MED ORDER — POLYVINYL ALCOHOL 1.4 % OP SOLN: 1.0000 [drp] | Freq: Four times a day (QID) | OPHTHALMIC | Status: DC | PRN

## 2022-11-03 MED ORDER — MORPHINE BOLUS VIA INFUSION: 2.0000 mg | INTRAVENOUS | Status: DC | PRN

## 2022-11-18 NOTE — Progress Notes (Signed)
   Palliative Medicine Inpatient Follow Up Note HPI:  82 y.o. 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 Palliative care is involved for ongoing goals of care conversations.   Today's Discussion   *Please note that this is a verbal dictation therefore any spelling or grammatical errors are due to the "Dragon [employer] One" system interpretation.  Chart reviewed inclusive of vital signs, progress notes, laboratory results, and diagnostic images.   I met with [name]'s wife [name] and daughter, [name]. We discussed his present health state and the reality that he is showing signs that he is actively dying. We reviewed the discomfort associated with the core-track feeding tube and non-re-breather face mask.   We discussed that transitioning our focus to full comfort care - initiating a morphine gtt and slowly weaning Allen off oxygen. Patients family was very emotional but all understand that [name] has limited time and they all want him to be comfortable in his final moments.   Created space and opportunity for patients spouse to explore thoughts feelings and fears regarding Daruis's current medical situation.  Patients [employer] - [name] will likely be stopping by per our conversation.   Family has elected to proceed with full comfort oriented care.   Questions and concerns addressed/Palliative Support Provided.   Objective Assessment: Vital Signs Vitals:    1418  1535  BP: (!) 87/53 94/63  Pulse: 93 (!) 101  Resp: (!) 22 (!) 21  Temp: (!) 96.3 F (35.7 C) (!) 97.2 F (36.2 C)  SpO2: 98% 98%    Intake/Output Summary (Last 24 hours) at  1651 Last data filed at  1300 Gross per 24 hour  Intake 1376.17 ml  Output 364 ml  Net 1012.17 ml   Last Weight  Most recent  update:   6:16 AM    Weight  78.8 kg (173 lb 11.6 oz)            Gen:  Elderly Caucasian M  HEENT: core-track in place, dry mucous membranes CV: Irregular rate and rhythm  PULM:On NRB FM cheyne stokes respirations ABD: soft/nontender  EXT: No edema  Neuro: Somnolent  SUMMARY OF RECOMMENDATIONS   DNAR/DNI  Comfort care  Initiate morphine gtt - titrate to comfort  DC NGT  Slow wean to 2LPM [place]  Ongoing PMT support  Anticipate in hospital death  Time Spent: 60 Billing based on MDM: High ______________________________________________________________________________________ [name] [place] Palliative Medicine Team Team Cell Phone: [phone] Please utilize secure chat with additional questions, if there is no response within 30 minutes please call the above phone number  Palliative Medicine Team providers are available by phone from 7am to 7pm daily and can be reached through the team cell phone.  Should this patient require assistance outside of these hours, please call the patient's attending physician.

## 2022-11-18 NOTE — Progress Notes (Signed)
NAME:  [name], MRN:  [id number], DOB:  Jul 31, 1940, LOS: 5 ADMISSION DATE:  10/29/2022, CONSULTATION DATE:  10/29/22 REFERRING MD:  Anitra Lauth, CHIEF COMPLAINT:  SOB   History of Present Illness:  82 year old man hx incomplete paraplegia now near wheelchair bound, severe COPD/OSA overlap on HOT/CPAP previously on hospice presenting from home for progressive SOB.  Bagged by EMS for low sats and near coma.  Code status was reversed by wife and patient intubated for airway protection in ER.  CXR with LLL infiltrate.  Head CT pending.  ABG with severe CO2 retention.  Bps borderline. PCCM to admit.  Pertinent  Medical History  COPD Wheelchair bound HOT Severe muscle wasting, spasticity Sacral pressure ulcer  Significant Hospital Events: Including procedures, antibiotic start and stop dates in addition to other pertinent events   5/12 admit, code status updated to DNR 5/13 extubated  5/14 abx narrowed. severe to profound pharyngeal phase dysphagia as per this modified barium swallow study  5/15: Clinically declined.  Nothing further to add.  Added as needed morphine, as needed Haldol, and scheduled Zyprexa for terminal delirium 5/16 developed A-fib with RVR placed on calcium channel blocker infusion.  Increasing oxygen need in spite of high flow oxygen.  Transitioning to comfort care with no escalation  Interim History / Subjective:  Marked distress today Objective   Blood pressure (Abnormal) 86/50, pulse 95, temperature (Abnormal) 97.2 F (36.2 C), resp. rate (Abnormal) 21, height 5\' 7"  (1.702 m), weight 78.8 kg, SpO2 99 %.        Intake/Output Summary (Last 24 hours) at  0846 Last data filed at  0800 Gross per 24 hour  Intake 1636.85 ml  Output 515 ml  Net 1121.85 ml   Filed Weights   11/01/22 0456 11/02/22 0416  0500  Weight: 79.1 kg 78.6 kg 78.8 kg    Examination: General this is a terminally ill 82 year old male patient currently on high flow  oxygen he appears comfortable today his wife is at bedside HEENT normocephalic atraumatic no jugular venous distention Pulmonary: Coarse scattered rhonchi, mild labored respirations Cardiac: Regular irregular Abdomen: Soft nontender Extremities: Warm dry, dependent edema Neuro: Sedated today GU: Clear yellow.  Resolved Hospital Problem list   Bradycardia  Septic shock 2/2 CAP, resolved  Assessment & Plan:   Acute on chronic hypoxic and hypercarbic resp failure LLL CAP vs aspiration (favor aspiration given MBS findings) Advanced COPD / Sleep apnea overlap, on CPAP  Comfort care - strep urine ag neg Plan Complete 5d doxy and ceftriaxone  Cont solumed at 40mg  Continue supplemental oxygen and PRN NTS cont brovana, pulmicort, yupelri, prn albuterol NPO PRN morphine  Comfort care focus. If deteriorates more will transition to morphine gtt  Terminal delirium Plan As needed Haldol Zyprexa (dose increased yesterday)  AF w/ RVR Plan Tele  Not candidate for [facility] CCB for HTN   HTN w/ concern about w/d Plan Monitor   DM Plan Cont to monitor   Chronic pain  Arthritis  FTT in adult Physical debility Incomplete paraplegia  GOC DNR status  Multiple DTI- POA Plan NPO On-going PMT discussion    Hypothyroid Plan continue synthroid     Best Practice (right click and "Reselect all SmartList Selections" daily)   Diet/type: NPO DVT prophylaxis: LMWH  GI prophylaxis: PPI Lines: Central line- PICC Foley:  Yes, and it is still needed> chronic Code Status:  limited Last date of multidisciplinary goals of care discussion [Called and updated wife 5/12]  He has transfer to progressive pending.  I spoke with his wife again she understands that we will transition to full comfort should he clinically deteriorate further.  At this point he seems to be tolerating as needed morphine and family is satisfied on his level of comfort.  They understand he will not survive this  hospitalization.  They are hopeful that he can make it to Sunday when his other son will arrive back in town however should his work of breathing worsen family is supportive of morphine infusion  Simonne Martinet ACNP-BC [facility] Pulmonary/Critical Care Pager # [phone] OR # [phone] if no answer

## 2022-11-18 NOTE — Progress Notes (Signed)
[facility] night cross cover note:   I was notified by RN that this patient, who was on comfort care, has passed away with TOD noted to be 2323 on .     Newton Pigg, DO Hospitalist

## 2022-11-18 NOTE — Progress Notes (Signed)
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  He was bagged by EMS for low saturations and was near comatose.  His CODE STATUS was reversed by his wife and is intubated for airway protection in the ED.  Chest x-ray was done and showed a left lower lobe infiltrate and head CT was obtained and ABG showed severe CO2 retention.  Blood pressures remained borderline so PCCM was asked to admit this patient.  Subsequently had a prolonged hospital course with the following significant hospital events:  Significant Hospital Events: Including procedures, antibiotic start and stop dates in addition to other pertinent events   5/12 admit, code status updated to DNR 5/13 extubated  5/14 abx narrowed. severe to profound pharyngeal phase dysphagia as per this modified barium swallow study  5/15: Clinically declined.  Nothing further to add.  Added as needed morphine, as needed Haldol, and scheduled Zyprexa for terminal delirium 5/16 developed A-fib with RVR placed on calcium channel blocker infusion.  Increasing oxygen need in spite of high flow oxygen.  Transitioning to comfort care with no escalation 5/17 he is transferred to the [facility] service and remained unresponsive.  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 LLL CAP vs aspiration (favor aspiration given MBS findings) Septic Shock 2/2 CAP, poA and improved  Advanced COPD / Sleep apnea overlap, on CPAP  Comfort care -Urine antigen was negative and patient was to  omplete 5d doxy and ceftriaxone and solumed at 40mg  but is now being transitioned to full comfort care -She has extremely poor prognosis and has a high risk for further inpatient decompensation and imminent death is pending -Will be placed on a morphine drip And continue acetaminophen, albuterol nebs, glycopyrrolate, olanzapine and ondansetron and discontinue all medications on the patient's comfort  Terminal delirium -As needed Haldol -Home medications on the patient's comfort will be discontinued as he is being transitioned to full comfort care given his extreme poor prognosis   AF w/ RVR -Transitioning to Comfort Care and discontinue telemetry monitoring and rate control medications  HTN w/ concern about w/d -Will discontinue all medications not in the patient's comfort   DM Type 2 -CBG Trend: Recent Labs  Lab 11/01/22 0809 11/01/22 1223 11/01/22 1603 11/01/22 2025 11/02/22 0052 11/02/22 0328 11/02/22 1139  GLUCAP 83 117* 139* 123* 140* 149* 204*    Chronic pain  Arthritis  FTT in adult Physical debility Incomplete paraplegia -Transitioning to Comfort Care  Normocytic Anemia -Patient's Hgb/Hct Trend: Recent Labs  Lab 10/29/22 1058 10/29/22 1545 10/29/22 1633 10/30/22 0525 10/31/22 0346 11/01/22 0500 11/02/22 0320  HGB 13.6 13.3 12.6* 11.9* 11.4* 11.5* 11.9*  HCT 40.0 39.0 37.0* 38.3* 36.7* 36.2* 38.1*  MCV  --   --   --  84.9 84.4 83.2 84.7  -Will not continue to monitor and trend given that he is transition to comfort care  GOC DNR status and will be fully transitioned to comfort care now  Multiple Pressure U/cers/DTI- POA Pressure Injury 10/30/22 Sacrum Mid Stage 2 -  Partial thickness loss of dermis  presenting as a shallow open injury with a red, pink wound bed without slough. (Active)  10/30/22 1600  Location: Sacrum  Location Orientation: Mid  Staging: Stage 2 -  Partial thickness loss of dermis presenting as a shallow open injury with a red, pink  wound bed without slough.  Wound Description (Comments):   Present on Admission: Yes     Pressure Injury 10/30/22 Buttocks Left Stage 2 -  Partial thickness loss of dermis presenting as a shallow open injury with a red, pink wound bed without slough. (Active)  10/30/22 1600  Location: Buttocks  Location Orientation: Left  Staging: Stage 2 -  Partial thickness loss of dermis presenting as a shallow open injury with a red, pink wound bed without slough.  Wound Description (Comments):   Present on Admission: Yes     Pressure Injury 10/30/22 Buttocks Right Stage 2 -  Partial thickness loss of dermis presenting as a shallow open injury with a red, pink wound bed without slough. (Active)  10/30/22 1600  Location: Buttocks  Location Orientation: Right  Staging: Stage 2 -  Partial thickness loss of dermis presenting as a shallow open injury with a red, pink wound bed without slough.  Wound Description (Comments):   Present on Admission:    Hypothyroid -Discontinue Levothyroxine now that patient is Full Comfort Care    DVT prophylaxis: None, Patient is being transitioned to Comfort Care    Code Status: DNR Family Communication: Discussed with the patient's daughter at bedside  Disposition Plan:  Level of care: Progressive Status is: Inpatient Remains inpatient appropriate because: Has a high risk for further inpatient decompensation given that he is being transitioned to full comfort care and Anticipated In Hospital Death is imminent    Consultants:  PCCM transfer Palliative care medicine  Procedures:  As delineated above  Antimicrobials:  Anti-infectives (From admission, onward)    Start     Dose/Rate Route Frequency Ordered Stop   11/01/22 2200  doxycycline (VIBRA-TABS) tablet 100 mg  Status:  Discontinued        100 mg Per Tube Every 12 hours 11/01/22 1149  1630   10/31/22 1045  doxycycline (VIBRAMYCIN) 100 mg in sodium chloride 0.9 % 250 mL IVPB  Status:  Discontinued         100 mg 125 mL/hr over 120 Minutes Intravenous Every 12 hours 10/31/22 0959 11/01/22 1149   10/29/22 1245  vancomycin (VANCOREADY) IVPB 1750 mg/350 mL  Status:  Discontinued        1,750 mg 175 mL/hr over 120 Minutes Intravenous Every 24 hours 10/29/22 1234 10/31/22 0959   10/29/22 1015  cefTRIAXone (ROCEPHIN) 2 g in sodium chloride 0.9 % 100 mL IVPB        2 g 200 mL/hr over 30 Minutes Intravenous Every 24 hours 10/29/22 1005 11/02/22 1036   10/29/22 1015  azithromycin (ZITHROMAX) 500 mg in sodium chloride 0.9 % 250 mL IVPB  Status:  Discontinued        500 mg 250 mL/hr over 60 Minutes Intravenous Every 24 hours 10/29/22 1005 10/31/22 0959       Subjective: Seen and examined at bedside and he is basically unresponsive to physical verbal stimuli but does respond to painful stimuli.  Resting and he thinks he is declining quicker.  Objective: Vitals:    1300  1330  1418  1535  BP: (!) 135/109 (!) 87/55 (!) 87/53 94/63  Pulse: (!) 101 97 93 (!) 101  Resp: (!) 21 (!) 26 (!)  22 (!) 21  Temp: (!) 97.5 F (36.4 C) 97.7 F (36.5 C) (!) 96.3 F (35.7 C) (!) 97.2 F (36.2 C)  TempSrc:   Axillary Axillary  SpO2: 100% 100% 98% 98%  Weight:      Height:        Intake/Output Summary (Last 24 hours) at  1646 Last data filed at  1300 Gross per 24 hour  Intake 1376.17 ml  Output 364 ml  Net 1012.17 ml   Filed Weights   11/01/22 0456 11/02/22 0416  0500  Weight: 79.1 kg 78.6 kg 78.8 kg   Examination: Physical Exam:  Constitutional: WN/WD overweight chronically ill-appearing Caucasian male who is not really responsive to any physical or verbal stimuli Respiratory: Diminished to auscultation bilaterally, no wheezing, rales, rhonchi or crackles. Normal respiratory effort and patient is not tachypenic. No accessory muscle use.  Continues have high flow oxygen on Cardiovascular: Irregularly irregular, no murmurs / rubs /  gallops. S1 and S2 auscultated.  lower extremity dependent edema Abdomen: Soft, non-tender, distended secondary to body habitus bowel sounds positive.  GU: Deferred. Musculoskeletal: No clubbing / cyanosis of digits/nails. No joint deformity upper and lower extremities.  Skin: No rashes, lesions, ulcers on a limited skin evaluation. No induration; Warm and dry.  Neurologic: Is not awake to follow commands and remains somnolent and sedated Psychiatric: Impaired judgment and insight  Data Reviewed: I have personally reviewed following labs and imaging studies  CBC: Recent Labs  Lab 10/29/22 1000 10/29/22 1058 10/29/22 1633 10/30/22 0525 10/31/22 0346 11/01/22 0500 11/02/22 0320  WBC 17.5*  --   --  10.4 10.9* 12.4* 10.4  NEUTROABS 14.4*  --   --   --   --   --   --   HGB 13.5   < > 12.6* 11.9* 11.4* 11.5* 11.9*  HCT 46.0   < > 37.0* 38.3* 36.7* 36.2* 38.1*  MCV 90.9  --   --  84.9 84.4 83.2 84.7  PLT 294  --   --  217 245 232 214   < > = values in this interval not displayed.   Basic Metabolic Panel: Recent Labs  Lab 10/29/22 1000 10/29/22 1058 10/29/22 1633 10/30/22 0525 10/31/22 0346 11/01/22 0500 11/01/22 1556 11/02/22 0320  NA 140   < > 139 137 138 138  --  136  K 5.1   < > 4.0 4.5 3.4* 3.3*  --  3.8  CL 91*  --   --  94* 95* 96*  --  95*  CO2 35*  --   --  31 27 29   --  31  GLUCOSE 197*  --   --  124* 122* 83  --  124*  BUN 19  --   --  25* 23 17  --  19  CREATININE 0.70  --   --  0.79 0.82 0.64  --  0.55*  CALCIUM 9.5  --   --  9.0 8.8* 8.3*  --  8.6*  MG  --   --   --  2.2  --  1.8 2.1 2.1  PHOS  --   --   --  3.7  --   --  3.4 2.9   < > = values in this interval not displayed.   GFR: Estimated Creatinine Clearance: 67.7 mL/min (A) (by C-G formula based on SCr of 0.55 mg/dL (L)). Liver Function Tests: Recent Labs  Lab 10/29/22 1000  AST 18  ALT 12  ALKPHOS 77  BILITOT 0.5  PROT 7.9  ALBUMIN 3.9   No results for input(s): "LIPASE", "AMYLASE" in the  last 168 hours. No results for input(s): "AMMONIA" in the last 168 hours. Coagulation Profile: Recent Labs  Lab 10/29/22 1000  INR 1.0   Cardiac Enzymes: No results for input(s): "CKTOTAL", "CKMB", "CKMBINDEX", "TROPONINI" in the last 168 hours. BNP (last 3 results) No results for input(s): "PROBNP" in the last 8760 hours. HbA1C: No results for input(s): "HGBA1C" in the last 72 hours. CBG: Recent Labs  Lab 11/01/22 1603 11/01/22 2025 11/02/22 0052 11/02/22 0328 11/02/22 1139  GLUCAP 139* 123* 140* 149* 204*   Lipid Profile: No results for input(s): "CHOL", "HDL", "LDLCALC", "TRIG", "CHOLHDL", "LDLDIRECT" in the last 72 hours. Thyroid Function Tests: No results for input(s): "TSH", "T4TOTAL", "FREET4", "T3FREE", "THYROIDAB" in the last 72 hours. Anemia Panel: No results for input(s): "VITAMINB12", "FOLATE", "FERRITIN", "TIBC", "IRON", "RETICCTPCT" in the last 72 hours. Sepsis Labs: Recent Labs  Lab 10/29/22 1000 10/29/22 1309  LATICACIDVEN 0.9 1.7    Recent Results (from the past 240 hour(s))  Blood Culture (routine x 2)     Status: None   Collection Time: 10/29/22 10:00 AM   Specimen: BLOOD RIGHT FOREARM  Result Value Ref Range Status   Specimen Description BLOOD RIGHT FOREARM  Final   Special Requests   Final    BOTTLES DRAWN AEROBIC AND ANAEROBIC Blood Culture adequate volume   Culture   Final    NO GROWTH 5 DAYS Performed at [facility], [address] [place], [place] [address]    Report Status  FINAL  Final  Blood Culture (routine x 2)     Status: None   Collection Time: 10/29/22 10:00 AM   Specimen: BLOOD RIGHT FOREARM  Result Value Ref Range Status   Specimen Description BLOOD RIGHT FOREARM  Final   Special Requests   Final    BOTTLES DRAWN AEROBIC AND ANAEROBIC Blood Culture adequate volume   Culture   Final    NO GROWTH 5 DAYS Performed at [facility], [address] [place], [place] [address]    Report Status   FINAL  Final  Resp panel by RT-PCR (RSV, Flu A&B, Covid) Anterior Nasal Swab     Status: None   Collection Time: 10/29/22 11:09 AM   Specimen: Anterior Nasal Swab  Result Value Ref Range Status   SARS Coronavirus 2 by RT PCR NEGATIVE NEGATIVE Final   Influenza A by PCR NEGATIVE NEGATIVE Final   Influenza B by PCR NEGATIVE NEGATIVE Final    Comment: (NOTE) The Xpert Xpress SARS-CoV-2/FLU/RSV plus assay is intended as an aid in the diagnosis of influenza from Nasopharyngeal swab specimens and should not be used as a sole basis for treatment. Nasal washings and aspirates are unacceptable for Xpert Xpress SARS-CoV-2/FLU/RSV testing.  Fact Sheet for Patients: BloggerCourse.com  Fact Sheet for Healthcare Providers: SeriousBroker.it  This test is not yet approved or cleared by the [place] FDA and has been authorized for detection and/or diagnosis of SARS-CoV-2 by FDA under an Emergency Use Authorization (EUA). This EUA will remain in effect (meaning this test can be used) for the duration of the COVID-19 declaration under Section 564(b)(1) of the Act, 21 U.S.C. section 360bbb-3(b)(1), unless the authorization is terminated or revoked.     Resp Syncytial Virus by PCR NEGATIVE NEGATIVE Final    Comment: (NOTE) Fact Sheet for Patients: BloggerCourse.com  Fact Sheet for Healthcare Providers: SeriousBroker.it  This test is  not yet approved or cleared by the [place] and has been authorized for detection and/or diagnosis of SARS-CoV-2 by FDA under an Emergency Use Authorization (EUA). This EUA will remain in effect (meaning this test can be used) for the duration of the COVID-19 declaration under Section 564(b)(1) of the Act, 21 U.S.C. section 360bbb-3(b)(1), unless the authorization is terminated or revoked.  Performed at [facility], [address],  [place], [place] [address]   Urine Culture     Status: Abnormal   Collection Time: 10/29/22 11:35 AM   Specimen: Urine, Random  Result Value Ref Range Status   Specimen Description URINE, RANDOM  Final   Special Requests URINE, CATHETERIZED  Final   Culture (A)  Final    >=100,000 COLONIES/mL INSIGNIFICANT GROWTH Performed at [facility], [address] [place], [place] [address]    Report Status 10/30/2022 FINAL  Final  MRSA Next Gen by PCR, Nasal     Status: Abnormal   Collection Time: 10/29/22  2:34 PM   Specimen: Nasal Mucosa; Nasal Swab  Result Value Ref Range Status   MRSA by PCR Next Gen DETECTED (A) NOT DETECTED Final    Comment: RESULT CALLED TO, READ BACK BY AND VERIFIED WITH: 10/29/2022 AT 1634 TO RN [place] S., ADC (NOTE) The GeneXpert MRSA Assay (FDA approved for NASAL specimens only), is one component of a comprehensive MRSA colonization surveillance program. It is not intended to diagnose MRSA infection nor to guide or monitor treatment for MRSA infections. Test performance is not FDA approved in patients less than 57 years old. Performed at [facility], [address], [place], [place] [address]     Radiology Studies: DG CHEST PORT 1 VIEW  Result Date: 11/02/2022 CLINICAL DATA:  Aspiration into airway. EXAM: PORTABLE CHEST 1 VIEW COMPARISON:  One-view chest x-ray 11/01/2022 FINDINGS: The heart is enlarged. Mild bibasilar airspace opacities have improved. Asymmetric elevation of the left hemidiaphragm. The right sided PICC line is in place. Mild pulmonary vascular congestion is present. IMPRESSION: 1. Cardiomegaly and mild pulmonary vascular congestion. 2. Improving bibasilar airspace opacities. 3. Asymmetric elevation of the left hemidiaphragm. Electronically Signed   By: Marin Roberts M.D.   On: 11/02/2022 08:54     Scheduled Meds:  acetaminophen (TYLENOL) oral liquid 160 mg/5 mL  650 mg Per Tube Q6H   latanoprost  1 drop Both Eyes QHS    OLANZapine  5 mg Oral QHS   Continuous Infusions:  sodium chloride Stopped (10/31/22 0855)   morphine      LOS: 5 days   Marguerita Merles, DO [facility] Available via Epic secure chat 7am-7pm After these hours, please refer to coverage provider listed on [email or web address] , 4:46 PM

## 2022-11-18 NOTE — Death Summary Note (Signed)
DEATH SUMMARY   Patient Details  Name: [name] MRN: [id number] DOB: [birth year] WUJ:[id number], Barry Dienes, MD Admission/Discharge Information   Admit Date:  2022-11-12  Date of Death: Date of Death: Nov 17, 2022  Time of Death: Time of Death: 2321-11-25  Length of Stay: 5   Principle Cause of death: Acute cardiopulmonary arrest in the setting of acute on chronic hypoxic hypercarbic respiratory failure from recurrent aspiration  Hospital Diagnoses: Principal Problem:   Respiratory failure (HCC) Active Problems:   Community acquired pneumonia of left lower lobe of lung   Sepsis with acute hypercapnic respiratory failure without septic shock (HCC)   Malnutrition of moderate degree  Hospital Course: Patient is an elderly 82 year old chronically ill-appearing Caucasian male with a 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  He was bagged by EMS for low saturations and was near comatose.  His CODE STATUS was reversed by his wife and is intubated for airway protection in the ED.  Chest x-ray was done and showed a left lower lobe infiltrate and head CT was obtained and ABG showed severe CO2 retention.  Blood pressures remained borderline so PCCM was asked to admit this patient.  Subsequently had a prolonged hospital course with the following significant hospital events:   Significant Hospital Events: Including procedures, antibiotic start and stop dates in addition to other pertinent events   11/12/2022 admit, code status updated to DNR 5/13 extubated  5/14 abx narrowed. severe to profound pharyngeal phase dysphagia as per this modified barium swallow study  5/15: Clinically declined.  Nothing further to add.  Added as needed morphine, as needed Haldol, and scheduled Zyprexa for terminal delirium 5/16 developed A-fib with RVR placed on calcium channel  blocker infusion.  Increasing oxygen need in spite of high flow oxygen.  Transitioning to comfort care with no escalation Nov 17, 2022 he is transferred to the [facility] service and remained unresponsive.  Palliative reevaluated and he is now full comfort measures after further GOC Disussion with the Family  Patient was transitioned to full comfort care and morphine drip was initiated and oxygen was slowly started weaning.  Subsequently he further decompensated as expected and passed away just prior to midnight at 2323 on 11/17/22  Assessment and Plan:  Acute on chronic hypoxic and hypercarbic resp failure LLL CAP vs aspiration (favor aspiration given MBS findings) Septic Shock 2/2 CAP, poA and improved  Advanced COPD / Sleep apnea overlap, on CPAP  Comfort care -Urine antigen was negative and patient was to omplete 5d doxy and ceftriaxone and solumed at 40mg  but is now being transitioned to full comfort care -She has extremely poor prognosis and has a high risk for further inpatient decompensation and imminent death is pending -Will be placed on a morphine drip And continue acetaminophen, albuterol nebs, glycopyrrolate, olanzapine and ondansetron and discontinue all medications on the patient's comfort   Terminal delirium -As needed Haldol -Home medications on the patient's comfort will be discontinued as he is being transitioned to full comfort care given his extreme poor prognosis   AF w/ RVR -Transitioning to Comfort Care and discontinue telemetry monitoring and rate control medications   HTN w/ concern about w/d -Will discontinue all medications not in the patient's comfort   DM Type 2 -CBG Trend: Last Labs           Recent Labs  Lab 11/01/22 0809 11/01/22 1223  11/01/22 1603 11/01/22 2025 11/02/22 0052 11/02/22 0328 11/02/22 1139  GLUCAP 83 117* 139* 123* 140* 149* 204*      Chronic pain  Arthritis  FTT in adult Physical debility Incomplete paraplegia -Transitioning to Comfort  Care   Normocytic Anemia -Patient's Hgb/Hct Trend: Last Labs           Recent Labs  Lab 10/29/22 1058 10/29/22 1545 10/29/22 1633 10/30/22 0525 10/31/22 0346 11/01/22 0500 11/02/22 0320  HGB 13.6 13.3 12.6* 11.9* 11.4* 11.5* 11.9*  HCT 40.0 39.0 37.0* 38.3* 36.7* 36.2* 38.1*  MCV  --   --   --  84.9 84.4 83.2 84.7    -Will not continue to monitor and trend given that he is transition to comfort care   GOC DNR status and will be fully transitioned to comfort care now    Hypothyroid -Discontinue Levothyroxine now that patient is Full Comfort Care      Multiple Pressure U/cers/DTI- POA Pressure Injury 10/30/22 Sacrum Mid Stage 2 -  Partial thickness loss of dermis presenting as a shallow open injury with a red, pink wound bed without slough. (Active)  10/30/22 1600  Location: Sacrum  Location Orientation: Mid  Staging: Stage 2 -  Partial thickness loss of dermis presenting as a shallow open injury with a red, pink wound bed without slough.  Wound Description (Comments):   Present on Admission: Yes     Pressure Injury 10/30/22 Buttocks Left Stage 2 -  Partial thickness loss of dermis presenting as a shallow open injury with a red, pink wound bed without slough. (Active)  10/30/22 1600  Location: Buttocks  Location Orientation: Left  Staging: Stage 2 -  Partial thickness loss of dermis presenting as a shallow open injury with a red, pink wound bed without slough.  Wound Description (Comments):   Present on Admission: Yes     Pressure Injury 10/30/22 Buttocks Right Stage 2 -  Partial thickness loss of dermis presenting as a shallow open injury with a red, pink wound bed without slough. (Active)  10/30/22 1600  Location: Buttocks  Location Orientation: Right  Staging: Stage 2 -  Partial thickness loss of dermis presenting as a shallow open injury with a red, pink wound bed without slough.  Wound Description (Comments):   Present on Admission:    Nutrition Documentation     Flowsheet Row ED to Hosp-Admission (Discharged) from 10/29/2022 in [place] [facility]  Nutrition Problem Moderate Malnutrition  Etiology wound healing, catabolic illness  [COPD]  Nutrition Goal Patient will meet greater than or equal to 90% of their needs  Interventions Refer to RD note for recommendations, Tube feeding     ,  Active Pressure Injury/Wound(s)     Pressure Ulcer  Duration          Pressure Injury 09/22/19 Ankle Left;Lateral Deep Tissue Pressure Injury - Purple or maroon localized area of discolored intact skin or blood-filled blister due to damage of underlying soft tissue from pressure and/or shear. 1139 days   Pressure Injury 10/30/22 Buttocks Left Stage 2 -  Partial thickness loss of dermis presenting as a shallow open injury with a red, pink wound bed without slough. 5 days   Pressure Injury 10/30/22 Buttocks Right Stage 2 -  Partial thickness loss of dermis presenting as a shallow open injury with a red, pink wound bed without slough. 5 days   Pressure Injury 10/30/22 Sacrum Mid Stage 2 -  Partial thickness loss of dermis presenting as a  shallow open injury with a red, pink wound bed without slough. 5 days           Procedures: Intubation and as delineated above  Consultations: PCCM transfer and palliative care medicine  The results of significant diagnostics from this hospitalization (including imaging, microbiology, ancillary and laboratory) are listed below for reference.   Significant Diagnostic Studies: DG CHEST PORT 1 VIEW  Result Date: 11/02/2022 CLINICAL DATA:  Aspiration into airway. EXAM: PORTABLE CHEST 1 VIEW COMPARISON:  One-view chest x-ray 11/01/2022 FINDINGS: The heart is enlarged. Mild bibasilar airspace opacities have improved. Asymmetric elevation of the left hemidiaphragm. The right sided PICC line is in place. Mild pulmonary vascular congestion is present. IMPRESSION: 1. Cardiomegaly and mild pulmonary vascular congestion. 2.  Improving bibasilar airspace opacities. 3. Asymmetric elevation of the left hemidiaphragm. Electronically Signed   By: Marin Roberts M.D.   On: 11/02/2022 08:54   DG Chest Port 1 View  Result Date: 11/01/2022 CLINICAL DATA:  Acute respiratory distress EXAM: PORTABLE CHEST 1 VIEW COMPARISON:  Chest x-ray 11/01/2022 FINDINGS: Right-sided central venous catheter tip projects over the SVC. The aorta is tortuous. The heart is enlarged. Enteric tube tip is in the body of the stomach. Small pleural effusions and bibasilar opacities are present, increasing on the right. There is no pneumothorax. Osseous structures are stable. IMPRESSION: 1. Small pleural effusions and bibasilar opacities, increasing on the right. 2. Cardiomegaly. 3. Support apparatus as described above. Electronically Signed   By: Darliss Cheney M.D.   On: 11/01/2022 16:28   DG Chest Port 1 View  Result Date: 11/01/2022 CLINICAL DATA:  Respiratory failure EXAM: PORTABLE CHEST 1 VIEW COMPARISON:  10/30/2022 FINDINGS: Endotracheal tube has been removed in the interval. Right PICC is noted at the cavoatrial junction. Left-sided pleural effusion is again noted. Postsurgical changes in the cervical spine are noted. IMPRESSION: Left-sided effusion.  No new focal abnormality is noted. Electronically Signed   By: Alcide Clever M.D.   On: 11/01/2022 10:48   DG Abd Portable 1V  Result Date: 11/01/2022 CLINICAL DATA:  [id number] Encounter for feeding tube placement [id number] EXAM: PORTABLE ABDOMEN - 1 VIEW COMPARISON:  10/29/2022 FINDINGS: Limited radiograph of the lower chest and upper abdomen was obtained for the purposes of enteric tube localization. Enteric tube is seen coursing below the diaphragm with distal tip terminating within the expected location of the gastric body. IMPRESSION: Enteric tube terminates within the expected location of the gastric body. Electronically Signed   By: Duanne Guess D.O.   On: 11/01/2022 09:55   DG Swallowing  Func-Speech Pathology  Result Date: 10/31/2022 Table formatting from the original result was not included. Modified Barium Swallow Study Patient Details Name: [name] MRN: [id number] Date of Birth: January 07, 1941 Today's Date: 10/31/2022 HPI/PMH: HPI: [name] is an 82 year old man who presented from home for progressive SOB.  Bagged by EMS for low sats and near coma.  Code status was reversed by wife and patient intubated for airway protection in ER. Pt previously on hospice. ETT 5/12-13. CXR with LLL infiltrate. Head CT 5/12 with no acute findings. Pt with hx incomplete paraplegia now near wheelchair bound, severe COPD/OSA overlap on HOT/CPAP, GERD with Barrett's esophagus.  Pt with hx thyroid cancern in 2009 with radiation tx. Clinical Impression: Clinical Impression: Patient is presenting with a severe to profound pharyngeal phase dysphagia as per this modified barium swallow study. SLP assessed his swallow with thin liquid and nectar thick liquid barium only. Swallow initiated  at level of vallecular sinus. Epiglottic inversion was very limited which appeared due to epiglottis coming into contact with obstructed by posterior pharyngeal wall, resulting in inadequate laryngeal vestibular closure and penetration of thin and nectar thick liquids. Partial anterior hyoid excursion, reduced pharyngeal space secondary to ACDF hardware as well as likely impact from h/o neck radiation resulted in very limited PES opening. (PES difficult to fully visualize secondary to obstruction caused by patient's shoulder) In addition, during swallow, epiglottis comes into direct contact with posterior pharyngeal wall, which markedly obstructs barium transit. SLP suspects that patient's current dysphagia is result of progression of his chronic dysphagia and unfortunately, likelihood of signficant improvement in his swallow function is poor. SLP recommending NPO if patient is to be full code/full scope as he is at a high risk of  aspiration as well as malnutrition. SLP will continue to follow to assist in GOC decisions, patient/family education, PO recommendations. Factors that may increase risk of adverse event in presence of aspiration Rubye Oaks & Clearance Coots 2021): Factors that may increase risk of adverse event in presence of aspiration Rubye Oaks & Clearance Coots 2021): Poor general health and/or compromised immunity; Frail or deconditioned; Limited mobility Recommendations/Plan: Swallowing Evaluation Recommendations Swallowing Evaluation Recommendations Recommendations: NPO Medication Administration: Via alternative means Oral care recommendations: Oral care QID (4x/day) Recommended consults: Consider Palliative care Treatment Plan Treatment Plan Treatment recommendations: Therapy as outlined in treatment plan below Follow-up recommendations: Follow physicians's recommendations for discharge plan and follow up therapies Functional status assessment: Patient has had a recent decline in their functional status and demonstrates the ability to make significant improvements in function in a reasonable and predictable amount of time. Treatment frequency: Min 2x/week Treatment duration: 1 week Interventions: Patient/family education; Aspiration precaution training Recommendations Recommendations for follow up therapy are one component of a multi-disciplinary discharge planning process, led by the attending physician.  Recommendations may be updated based on patient status, additional functional criteria and insurance authorization. Assessment: Orofacial Exam: Orofacial Exam Oral Cavity: Oral Hygiene: WFL Oral Cavity - Dentition: Edentulous; Dentures, not available Anatomy: Anatomy: Presence of cervical hardware Boluses Administered: Boluses Administered Boluses Administered: Thin liquids (Level 0); Mildly thick liquids (Level 2, nectar thick)  Oral Impairment Domain: Oral Impairment Domain Lip Closure: No labial escape Tongue control during bolus hold: Not  tested Bolus transport/lingual motion: Slow tongue motion Oral residue: Trace residue lining oral structures Location of oral residue : Palate Initiation of pharyngeal swallow : Valleculae  Pharyngeal Impairment Domain: Pharyngeal Impairment Domain Soft palate elevation: No bolus between soft palate (SP)/pharyngeal wall (PW) Anterior hyoid excursion: Partial anterior movement Epiglottic movement: Partial inversion Laryngeal vestibule closure: Incomplete, narrow column air/contrast in laryngeal vestibule Pharyngeal stripping wave : Present - diminished Pharyngeal contraction (A/P view only): N/A Pharyngoesophageal segment opening: Minimal distention/minimal duration, marked obstruction of flow Tongue base retraction: Trace column of contrast or air between tongue base and PPW Pharyngeal residue: Collection of residue within or on pharyngeal structures Location of pharyngeal residue: Valleculae; Pyriform sinuses  Esophageal Impairment Domain: No data recorded Pill: No data recorded Penetration/Aspiration Scale Score: Penetration/Aspiration Scale Score 3.  Material enters airway, remains ABOVE vocal cords and not ejected out: Thin liquids (Level 0); Mildly thick liquids (Level 2, nectar thick) Compensatory Strategies: Compensatory Strategies Compensatory strategies: No   General Information: Caregiver present: No  Diet Prior to this Study: NPO   Temperature : Normal   Respiratory Status: WFL   Supplemental O2: None (Room air)   History of Recent Intubation: Yes  Behavior/Cognition: Alert;  Cooperative; Pleasant mood Self-Feeding Abilities: Needs assist with self-feeding Baseline vocal quality/speech: Hypophonia/low volume Volitional Cough: Able to elicit Volitional Swallow: Able to elicit Exam Limitations: Other (comment) (difficult to visualize entire pharynx secondary to patient's shoulder partially obstructing; unable to reposition adequately) Goal Planning: Prognosis for improved oropharyngeal function: Guarded  Barriers to Reach Goals: Severity of deficits; Time post onset No data recorded Patient/Family Stated Goal: resume POs Consulted and agree with results and recommendations: Pt unable/family or caregiver not available Pain: Pain Assessment Pain Assessment: No/denies pain Facial Expression: 1 Body Movements: 0 Muscle Tension: 0 Compliance with ventilator (intubated pts.): N/A Vocalization (extubated pts.): 0 CPOT Total: 1 End of Session: Start Time:SLP Start Time (ACUTE ONLY): 0956 Stop Time: SLP Stop Time (ACUTE ONLY): 1003 Time Calculation:SLP Time Calculation (min) (ACUTE ONLY): 7 min Charges: SLP Evaluations $ SLP Speech Visit: 1 Visit SLP Evaluations $BSS Swallow: 1 Procedure SLP visit diagnosis: SLP Visit Diagnosis: Dysphagia, pharyngeal phase (R13.13); Dysphagia, pharyngoesophageal phase (R13.14) Past Medical History: Past Medical History: Diagnosis Date  Arthritis   Barrett esophagus   Bronchitis   Carotid artery occlusion   COPD (chronic obstructive pulmonary disease) (HCC)   Diabetes mellitus without complication (HCC)   GERD (gastroesophageal reflux disease)   Headache   migraines  History of thyroid cancer 2008  Hypertension   Restless leg   Sleep apnea with use of continuous positive airway pressure (CPAP)   2011 [facility] , [facility]  77cn central and obstrcutive. 16 cm water , 3 cm EPR.   Thoracic ascending aortic aneurysm (HCC)   4.2 cm ascending TAA 08/18/17 CTA. Annual imaging recommended.  thyroid ca dx'd 2009 or 2010  surg and radioactive isoptope  Ulcer   Peptic ulcer disease Past Surgical History: Past Surgical History: Procedure Laterality Date  ANTERIOR CERVICAL DECOMP/DISCECTOMY FUSION N/A 09/03/2017  Procedure: ACDF - C3-C4 - C4-C5 - C5-C6;  Surgeon: Donalee Citrin, MD;  Location: [facility] OR;  Service: Neurosurgery;  Laterality: N/A;  CAROTID ENDARTERECTOMY Left January 03, 2006  Dr. Madilyn Fireman  COLONOSCOPY    EYE SURGERY Bilateral   cataract surgery with lens implants  game keepers thumb Right   JOINT  REPLACEMENT Left   knee X 2  LUMBAR LAMINECTOMY/ DECOMPRESSION WITH MET-RX N/A 11/04/2019  Procedure: LUMBAR THREE-FOUR LUMBAR LAMINECTOMY/ DECOMPRESSION WITH MET-RX;  Surgeon: Jadene Pierini, MD;  Location: [facility] OR;  Service: Neurosurgery;  Laterality: N/A;  RADIOLOGY WITH ANESTHESIA N/A 10/16/2019  Procedure: MRI THORACIC LUMBAR SPINE;  Surgeon: Radiologist, Medication, MD;  Location: [facility] OR;  Service: Radiology;  Laterality: N/A;  ROTATOR CUFF REPAIR Right   THYROIDECTOMY  2008 Angela Nevin, MA, CCC-SLP Speech Therapy   DG Chest Port 1 View  Result Date: 10/30/2022 CLINICAL DATA:  Respiratory distress. EXAM: PORTABLE CHEST 1 VIEW COMPARISON:  10/29/2022 FINDINGS: Endotracheal tube has tip 4.3 cm above the carina. Right-sided PICC line is present with tip over the SVC. Enteric tube courses into the stomach and off the film as tip is not visualized. Lungs are adequately inflated demonstrate persistent hazy opacification of the left mid to lower lung likely layering effusion with associated atelectasis. Right lung is clear. Cardiomediastinal silhouette is normal. Mild hazy prominence of the central pulmonary vessels unchanged and may be due to mild vascular congestion. Remainder of the exam is unchanged. IMPRESSION: 1. Persistent hazy opacification of the left mid to lower lung likely layering effusion with associated atelectasis. 2. Suggestion of mild vascular congestion. 3. Tubes and lines as described. Electronically Signed  By: Elberta Fortis M.D.   On: 10/30/2022 10:35   CT Head Wo Contrast  Result Date: 10/29/2022 CLINICAL DATA:  Mental status change, unknown cause. EXAM: CT HEAD WITHOUT CONTRAST TECHNIQUE: Contiguous axial images were obtained from the base of the skull through the vertex without intravenous contrast. RADIATION DOSE REDUCTION: This exam was performed according to the departmental dose-optimization program which includes automated exposure control, adjustment of the mA and/or kV  according to patient size and/or use of iterative reconstruction technique. COMPARISON:  09/17/2019 FINDINGS: Brain: No evidence of acute infarction, hemorrhage, hydrocephalus, extra-axial collection or mass lesion/mass effect. Generalized atrophy. Chronic small vessel ischemia in the cerebral white matter. Vascular: No hyperdense vessel or unexpected calcification. Skull: Normal. Negative for fracture or focal lesion. Sinuses/Orbits: No acute finding. IMPRESSION: Aging brain without acute or reversible finding. Electronically Signed   By: Tiburcio Pea M.D.   On: 10/29/2022 12:39   DG Abd Portable 1 View  Result Date: 10/29/2022 CLINICAL DATA:  Post OG tube placement EXAM: PORTABLE ABDOMEN - 1 VIEW COMPARISON:  None Available. FINDINGS: The OG tube terminates in the stomach. IMPRESSION: The OG tube terminates in the stomach. Electronically Signed   By: Gerome Sam III M.D.   On: 10/29/2022 12:28   [place] EKG SITE RITE  Result Date: 10/29/2022 If Site Rite image not attached, placement could not be confirmed due to current cardiac rhythm.  DG Chest Port 1 View  Result Date: 10/29/2022 CLINICAL DATA:  Questionable sepsis, evaluate for abnormality. EXAM: PORTABLE CHEST 1 VIEW COMPARISON:  Chest radiograph 04/05/2020, 02/27/2020 FINDINGS: Of note, patient is moderately rotated on this portable examination. Endotracheal tube tip projects 5.2 cm above the carina. Stable enlarged cardiac silhouette. Hazy airspace opacity at the left lung base with probable small left pleural effusion. Right lung is clear. No pneumothorax. Anterior cervical spinal fixation. Surgical clips noted at the thyroid bed bilaterally. IMPRESSION: 1. Endotracheal tube tip projects 5.2 cm above the carina. 2. Hazy airspace opacity at the left lung base, which may reflect atelectasis or infection with probable small left pleural effusion. Electronically Signed   By: Sherron Ales M.D.   On: 10/29/2022 11:22    Microbiology: Recent  Results (from the past 240 hour(s))  Blood Culture (routine x 2)     Status: None   Collection Time: 10/29/22 10:00 AM   Specimen: BLOOD RIGHT FOREARM  Result Value Ref Range Status   Specimen Description BLOOD RIGHT FOREARM  Final   Special Requests   Final    BOTTLES DRAWN AEROBIC AND ANAEROBIC Blood Culture adequate volume   Culture   Final    NO GROWTH 5 DAYS Performed at [facility], [address] [place], [place] [address]    Report Status  FINAL  Final  Blood Culture (routine x 2)     Status: None   Collection Time: 10/29/22 10:00 AM   Specimen: BLOOD RIGHT FOREARM  Result Value Ref Range Status   Specimen Description BLOOD RIGHT FOREARM  Final   Special Requests   Final    BOTTLES DRAWN AEROBIC AND ANAEROBIC Blood Culture adequate volume   Culture   Final    NO GROWTH 5 DAYS Performed at [facility], [address] [place], [place] [address]    Report Status  FINAL  Final  Resp panel by RT-PCR (RSV, Flu A&B, Covid) Anterior Nasal Swab     Status: None   Collection Time: 10/29/22 11:09 AM   Specimen:  Anterior Nasal Swab  Result Value Ref Range Status   SARS Coronavirus 2 by RT PCR NEGATIVE NEGATIVE Final   Influenza A by PCR NEGATIVE NEGATIVE Final   Influenza B by PCR NEGATIVE NEGATIVE Final    Comment: (NOTE) The Xpert Xpress SARS-CoV-2/FLU/RSV plus assay is intended as an aid in the diagnosis of influenza from Nasopharyngeal swab specimens and should not be used as a sole basis for treatment. Nasal washings and aspirates are unacceptable for Xpert Xpress SARS-CoV-2/FLU/RSV testing.  Fact Sheet for Patients: BloggerCourse.com  Fact Sheet for Healthcare Providers: SeriousBroker.it  This test is not yet approved or cleared by the [place] FDA and has been authorized for detection and/or diagnosis of SARS-CoV-2 by FDA under an Emergency Use Authorization (EUA). This EUA will  remain in effect (meaning this test can be used) for the duration of the COVID-19 declaration under Section 564(b)(1) of the Act, 21 U.S.C. section 360bbb-3(b)(1), unless the authorization is terminated or revoked.     Resp Syncytial Virus by PCR NEGATIVE NEGATIVE Final    Comment: (NOTE) Fact Sheet for Patients: BloggerCourse.com  Fact Sheet for Healthcare Providers: SeriousBroker.it  This test is not yet approved or cleared by the [place] FDA and has been authorized for detection and/or diagnosis of SARS-CoV-2 by FDA under an Emergency Use Authorization (EUA). This EUA will remain in effect (meaning this test can be used) for the duration of the COVID-19 declaration under Section 564(b)(1) of the Act, 21 U.S.C. section 360bbb-3(b)(1), unless the authorization is terminated or revoked.  Performed at [facility], [address], [place], [place] [address]   Urine Culture     Status: Abnormal   Collection Time: 10/29/22 11:35 AM   Specimen: Urine, Random  Result Value Ref Range Status   Specimen Description URINE, RANDOM  Final   Special Requests URINE, CATHETERIZED  Final   Culture (A)  Final    >=100,000 COLONIES/mL INSIGNIFICANT GROWTH Performed at [facility], [address], [place], [place] [address]    Report Status 10/30/2022 FINAL  Final  MRSA Next Gen by PCR, Nasal     Status: Abnormal   Collection Time: 10/29/22  2:34 PM   Specimen: Nasal Mucosa; Nasal Swab  Result Value Ref Range Status   MRSA by PCR Next Gen DETECTED (A) NOT DETECTED Final    Comment: RESULT CALLED TO, READ BACK BY AND VERIFIED WITH: 10/29/2022 AT 1634 TO RN [place] S., ADC (NOTE) The GeneXpert MRSA Assay (FDA approved for NASAL specimens only), is one component of a comprehensive MRSA colonization surveillance program. It is not intended to diagnose MRSA infection nor to guide or monitor treatment for MRSA  infections. Test performance is not FDA approved in patients less than 31 years old. Performed at [facility], [address], [place], [place] [address]    Time spent: 25 minutes  Signed: Marguerita Merles, DO [facility]

## 2022-11-18 DEATH — deceased

## 2022-11-27 ENCOUNTER — Ambulatory Visit: Payer: Medicare Other | Admitting: Podiatry

## 2022-12-04 ENCOUNTER — Ambulatory Visit: Payer: Medicare Other | Admitting: Podiatry

## 2022-12-29 ENCOUNTER — Ambulatory Visit: Payer: Medicare Other | Admitting: Physical Medicine and Rehabilitation

## 2023-01-02 ENCOUNTER — Ambulatory Visit: Payer: Medicare Other | Admitting: Neurology

## 2023-04-02 ENCOUNTER — Ambulatory Visit: Payer: Medicare Other | Admitting: Physical Medicine and Rehabilitation
